# Patient Record
Sex: Female | Born: 1945 | Race: White | Hispanic: No | Smoking: Former smoker
Health system: Southern US, Community
[De-identification: ages and names within clinical notes are randomized; demographics above are authoritative.]

## PROBLEM LIST (undated history)

## (undated) DIAGNOSIS — J449 Chronic obstructive pulmonary disease, unspecified: Secondary | ICD-10-CM

## (undated) DIAGNOSIS — L409 Psoriasis, unspecified: Secondary | ICD-10-CM

## (undated) DIAGNOSIS — K2270 Barrett's esophagus without dysplasia: Secondary | ICD-10-CM

## (undated) DIAGNOSIS — I499 Cardiac arrhythmia, unspecified: Secondary | ICD-10-CM

## (undated) DIAGNOSIS — I5032 Chronic diastolic (congestive) heart failure: Secondary | ICD-10-CM

## (undated) DIAGNOSIS — I2510 Atherosclerotic heart disease of native coronary artery without angina pectoris: Secondary | ICD-10-CM

## (undated) DIAGNOSIS — Z951 Presence of aortocoronary bypass graft: Secondary | ICD-10-CM

## (undated) DIAGNOSIS — D649 Anemia, unspecified: Secondary | ICD-10-CM

## (undated) DIAGNOSIS — J8410 Pulmonary fibrosis, unspecified: Secondary | ICD-10-CM

## (undated) DIAGNOSIS — N1831 Chronic kidney disease, stage 3a: Secondary | ICD-10-CM

## (undated) DIAGNOSIS — Z9981 Dependence on supplemental oxygen: Secondary | ICD-10-CM

## (undated) DIAGNOSIS — J13 Pneumonia due to Streptococcus pneumoniae: Secondary | ICD-10-CM

## (undated) DIAGNOSIS — I214 Non-ST elevation (NSTEMI) myocardial infarction: Secondary | ICD-10-CM

## (undated) DIAGNOSIS — L4050 Arthropathic psoriasis, unspecified: Secondary | ICD-10-CM

## (undated) DIAGNOSIS — J45991 Cough variant asthma: Secondary | ICD-10-CM

## (undated) DIAGNOSIS — E039 Hypothyroidism, unspecified: Secondary | ICD-10-CM

## (undated) DIAGNOSIS — I639 Cerebral infarction, unspecified: Secondary | ICD-10-CM

## (undated) DIAGNOSIS — F32A Depression, unspecified: Secondary | ICD-10-CM

## (undated) DIAGNOSIS — J9611 Chronic respiratory failure with hypoxia: Secondary | ICD-10-CM

## (undated) DIAGNOSIS — R0609 Other forms of dyspnea: Secondary | ICD-10-CM

## (undated) DIAGNOSIS — K219 Gastro-esophageal reflux disease without esophagitis: Secondary | ICD-10-CM

## (undated) DIAGNOSIS — J9612 Chronic respiratory failure with hypercapnia: Secondary | ICD-10-CM

## (undated) DIAGNOSIS — M790 Rheumatism, unspecified: Secondary | ICD-10-CM

## (undated) DIAGNOSIS — J9610 Chronic respiratory failure, unspecified whether with hypoxia or hypercapnia: Secondary | ICD-10-CM

## (undated) DIAGNOSIS — E785 Hyperlipidemia, unspecified: Secondary | ICD-10-CM

## (undated) DIAGNOSIS — I779 Disorder of arteries and arterioles, unspecified: Secondary | ICD-10-CM

## (undated) DIAGNOSIS — I10 Essential (primary) hypertension: Secondary | ICD-10-CM

## (undated) DIAGNOSIS — Z8709 Personal history of other diseases of the respiratory system: Secondary | ICD-10-CM

## (undated) DIAGNOSIS — I5033 Acute on chronic diastolic (congestive) heart failure: Secondary | ICD-10-CM

## (undated) DIAGNOSIS — Z9989 Dependence on other enabling machines and devices: Secondary | ICD-10-CM

## (undated) DIAGNOSIS — J209 Acute bronchitis, unspecified: Secondary | ICD-10-CM

## (undated) DIAGNOSIS — R001 Bradycardia, unspecified: Secondary | ICD-10-CM

## (undated) DIAGNOSIS — R0602 Shortness of breath: Secondary | ICD-10-CM

## (undated) DIAGNOSIS — I509 Heart failure, unspecified: Secondary | ICD-10-CM

## (undated) DIAGNOSIS — M1990 Unspecified osteoarthritis, unspecified site: Secondary | ICD-10-CM

## (undated) HISTORY — PX: CARPAL TUNNEL RELEASE: SHX101

## (undated) HISTORY — DX: Presence of aortocoronary bypass graft: Z95.1

## (undated) HISTORY — DX: Bradycardia, unspecified: R00.1

## (undated) HISTORY — DX: Shortness of breath: R06.02

## (undated) HISTORY — DX: Pulmonary fibrosis, unspecified: J84.10

## (undated) HISTORY — PX: KNEE ARTHROSCOPY: SUR90

## (undated) HISTORY — PX: OTHER SURGICAL HISTORY: SHX169

## (undated) HISTORY — DX: Chronic respiratory failure with hypoxia: J96.11

## (undated) HISTORY — DX: Acute on chronic diastolic (congestive) heart failure: I50.33

## (undated) HISTORY — DX: Hypothyroidism, unspecified: E03.9

## (undated) HISTORY — PX: EYE SURGERY: SHX253

## (undated) HISTORY — PX: JOINT REPLACEMENT: SHX530

## (undated) HISTORY — DX: Chronic obstructive pulmonary disease, unspecified: J44.9

## (undated) HISTORY — DX: Arthropathic psoriasis, unspecified: L40.50

## (undated) HISTORY — DX: Barrett's esophagus without dysplasia: K22.70

## (undated) HISTORY — DX: Other forms of dyspnea: R06.09

## (undated) HISTORY — DX: Chronic respiratory failure, unspecified whether with hypoxia or hypercapnia: J96.10

## (undated) HISTORY — PX: TOTAL KNEE ARTHROPLASTY: SHX125

## (undated) HISTORY — DX: Morbid (severe) obesity due to excess calories: E66.01

## (undated) HISTORY — DX: Rheumatism, unspecified: M79.0

## (undated) HISTORY — DX: Acute bronchitis, unspecified: J20.9

## (undated) HISTORY — DX: Essential (primary) hypertension: I10

## (undated) HISTORY — DX: Cough variant asthma: J45.991

## (undated) HISTORY — DX: Hyperlipidemia, unspecified: E78.5

## (undated) HISTORY — PX: ABDOMINOPLASTY: SUR9

## (undated) HISTORY — PX: CHOLECYSTECTOMY: SHX55

## (undated) HISTORY — DX: Pneumonia due to Streptococcus pneumoniae: J13

## (undated) HISTORY — DX: Chronic respiratory failure with hypercapnia: J96.12

---

## 2000-11-18 ENCOUNTER — Encounter: Payer: Self-pay | Admitting: Internal Medicine

## 2001-01-01 ENCOUNTER — Encounter: Payer: Self-pay | Admitting: General Surgery

## 2001-01-04 ENCOUNTER — Encounter (INDEPENDENT_AMBULATORY_CARE_PROVIDER_SITE_OTHER): Payer: Self-pay

## 2001-01-04 ENCOUNTER — Observation Stay (HOSPITAL_COMMUNITY): Admission: RE | Admit: 2001-01-04 | Discharge: 2001-01-05 | Payer: Self-pay | Admitting: General Surgery

## 2001-02-18 ENCOUNTER — Encounter: Payer: Self-pay | Admitting: Internal Medicine

## 2001-02-18 ENCOUNTER — Encounter: Admission: RE | Admit: 2001-02-18 | Discharge: 2001-02-18 | Payer: Self-pay | Admitting: Internal Medicine

## 2001-03-05 ENCOUNTER — Encounter: Admission: RE | Admit: 2001-03-05 | Discharge: 2001-03-05 | Payer: Self-pay | Admitting: Neurosurgery

## 2001-03-05 ENCOUNTER — Encounter: Payer: Self-pay | Admitting: Neurosurgery

## 2001-11-16 ENCOUNTER — Encounter: Payer: Self-pay | Admitting: Internal Medicine

## 2002-04-26 ENCOUNTER — Encounter: Payer: Self-pay | Admitting: Internal Medicine

## 2003-03-20 ENCOUNTER — Other Ambulatory Visit: Admission: RE | Admit: 2003-03-20 | Discharge: 2003-03-20 | Payer: Self-pay | Admitting: Internal Medicine

## 2003-03-21 ENCOUNTER — Encounter: Payer: Self-pay | Admitting: Internal Medicine

## 2004-11-03 DIAGNOSIS — J13 Pneumonia due to Streptococcus pneumoniae: Secondary | ICD-10-CM

## 2004-11-03 DIAGNOSIS — Z8709 Personal history of other diseases of the respiratory system: Secondary | ICD-10-CM

## 2004-11-03 HISTORY — DX: Personal history of other diseases of the respiratory system: Z87.09

## 2004-11-03 HISTORY — DX: Pneumonia due to Streptococcus pneumoniae: J13

## 2004-12-03 ENCOUNTER — Ambulatory Visit: Payer: Self-pay | Admitting: Internal Medicine

## 2005-01-28 ENCOUNTER — Ambulatory Visit (HOSPITAL_COMMUNITY): Admission: RE | Admit: 2005-01-28 | Discharge: 2005-01-28 | Payer: Self-pay | Admitting: *Deleted

## 2005-01-28 ENCOUNTER — Encounter (INDEPENDENT_AMBULATORY_CARE_PROVIDER_SITE_OTHER): Payer: Self-pay | Admitting: *Deleted

## 2005-03-04 ENCOUNTER — Ambulatory Visit: Payer: Self-pay | Admitting: Internal Medicine

## 2005-03-11 ENCOUNTER — Other Ambulatory Visit: Admission: RE | Admit: 2005-03-11 | Discharge: 2005-03-11 | Payer: Self-pay | Admitting: Internal Medicine

## 2005-03-11 ENCOUNTER — Ambulatory Visit: Payer: Self-pay | Admitting: Internal Medicine

## 2005-03-23 ENCOUNTER — Ambulatory Visit: Payer: Self-pay | Admitting: Internal Medicine

## 2005-03-24 ENCOUNTER — Encounter: Payer: Self-pay | Admitting: Cardiology

## 2005-03-24 ENCOUNTER — Ambulatory Visit: Payer: Self-pay | Admitting: Physical Medicine & Rehabilitation

## 2005-03-24 ENCOUNTER — Inpatient Hospital Stay (HOSPITAL_COMMUNITY): Admission: EM | Admit: 2005-03-24 | Discharge: 2005-04-22 | Payer: Self-pay | Admitting: Emergency Medicine

## 2005-03-24 ENCOUNTER — Ambulatory Visit: Payer: Self-pay | Admitting: Cardiology

## 2005-03-24 ENCOUNTER — Ambulatory Visit: Payer: Self-pay | Admitting: Pulmonary Disease

## 2005-04-22 ENCOUNTER — Ambulatory Visit: Payer: Self-pay | Admitting: Physical Medicine & Rehabilitation

## 2005-04-22 ENCOUNTER — Inpatient Hospital Stay (HOSPITAL_COMMUNITY)
Admission: RE | Admit: 2005-04-22 | Discharge: 2005-05-02 | Payer: Self-pay | Admitting: Physical Medicine & Rehabilitation

## 2005-05-02 ENCOUNTER — Encounter: Payer: Self-pay | Admitting: Internal Medicine

## 2005-05-09 ENCOUNTER — Ambulatory Visit: Payer: Self-pay | Admitting: Internal Medicine

## 2005-06-16 ENCOUNTER — Ambulatory Visit: Payer: Self-pay | Admitting: Internal Medicine

## 2005-11-25 ENCOUNTER — Encounter: Payer: Self-pay | Admitting: Internal Medicine

## 2006-03-17 ENCOUNTER — Ambulatory Visit: Payer: Self-pay | Admitting: Internal Medicine

## 2006-03-31 ENCOUNTER — Ambulatory Visit: Payer: Self-pay | Admitting: Internal Medicine

## 2006-03-31 ENCOUNTER — Other Ambulatory Visit: Admission: RE | Admit: 2006-03-31 | Discharge: 2006-03-31 | Payer: Self-pay | Admitting: Internal Medicine

## 2006-03-31 ENCOUNTER — Encounter: Payer: Self-pay | Admitting: Internal Medicine

## 2006-09-12 ENCOUNTER — Encounter: Admission: RE | Admit: 2006-09-12 | Discharge: 2006-09-12 | Payer: Self-pay | Admitting: Internal Medicine

## 2006-11-06 ENCOUNTER — Ambulatory Visit: Payer: Self-pay | Admitting: Internal Medicine

## 2006-12-09 ENCOUNTER — Encounter: Payer: Self-pay | Admitting: Internal Medicine

## 2007-03-09 ENCOUNTER — Encounter: Payer: Self-pay | Admitting: Internal Medicine

## 2007-04-20 ENCOUNTER — Encounter: Payer: Self-pay | Admitting: Internal Medicine

## 2007-04-20 DIAGNOSIS — I10 Essential (primary) hypertension: Secondary | ICD-10-CM | POA: Insufficient documentation

## 2007-04-20 DIAGNOSIS — E039 Hypothyroidism, unspecified: Secondary | ICD-10-CM | POA: Insufficient documentation

## 2007-04-20 DIAGNOSIS — L4050 Arthropathic psoriasis, unspecified: Secondary | ICD-10-CM | POA: Insufficient documentation

## 2007-04-20 HISTORY — DX: Essential (primary) hypertension: I10

## 2007-05-11 ENCOUNTER — Ambulatory Visit: Payer: Self-pay | Admitting: Internal Medicine

## 2007-05-11 LAB — CONVERTED CEMR LAB
ALT: 19 units/L (ref 0–35)
AST: 24 units/L (ref 0–37)
Albumin: 3.6 g/dL (ref 3.5–5.2)
Alkaline Phosphatase: 51 units/L (ref 39–117)
BUN: 25 mg/dL — ABNORMAL HIGH (ref 6–23)
Basophils Absolute: 0 10*3/uL (ref 0.0–0.1)
Basophils Relative: 0.6 % (ref 0.0–1.0)
Bilirubin, Direct: 0.1 mg/dL (ref 0.0–0.3)
CO2: 34 meq/L — ABNORMAL HIGH (ref 19–32)
Calcium: 9.7 mg/dL (ref 8.4–10.5)
Chloride: 106 meq/L (ref 96–112)
Cholesterol: 213 mg/dL (ref 0–200)
Creatinine, Ser: 0.9 mg/dL (ref 0.4–1.2)
Direct LDL: 141.9 mg/dL
Eosinophils Absolute: 0.2 10*3/uL (ref 0.0–0.6)
Eosinophils Relative: 3.8 % (ref 0.0–5.0)
GFR calc Af Amer: 82 mL/min
GFR calc non Af Amer: 68 mL/min
Glucose, Bld: 102 mg/dL — ABNORMAL HIGH (ref 70–99)
HCT: 37.9 % (ref 36.0–46.0)
HDL: 57 mg/dL (ref >39.0)
Hemoglobin: 12.9 g/dL (ref 12.0–15.0)
Lymphocytes Relative: 34.9 % (ref 12.0–46.0)
MCHC: 33.9 g/dL (ref 30.0–36.0)
MCV: 86.5 fL (ref 78.0–100.0)
Monocytes Absolute: 0.7 10*3/uL (ref 0.2–0.7)
Monocytes Relative: 11.4 % — ABNORMAL HIGH (ref 3.0–11.0)
Neutro Abs: 3.2 10*3/uL (ref 1.4–7.7)
Neutrophils Relative %: 49.3 % (ref 43.0–77.0)
Platelets: 231 10*3/uL (ref 150–400)
Potassium: 3.8 meq/L (ref 3.5–5.1)
RBC: 4.38 M/uL (ref 3.87–5.11)
RDW: 13.7 % (ref 11.5–14.6)
Sodium: 144 meq/L (ref 135–145)
TSH: 7.55 microintl units/mL — ABNORMAL HIGH (ref 0.35–5.50)
Total Bilirubin: 0.8 mg/dL (ref 0.3–1.2)
Total CHOL/HDL Ratio: 3.7
Total Protein: 6.9 g/dL (ref 6.0–8.3)
Triglycerides: 87 mg/dL (ref 0–149)
VLDL: 17 mg/dL (ref 0–40)
WBC: 6.3 10*3/uL (ref 4.5–10.5)

## 2007-05-18 ENCOUNTER — Ambulatory Visit: Payer: Self-pay | Admitting: Internal Medicine

## 2007-05-21 ENCOUNTER — Encounter: Payer: Self-pay | Admitting: Internal Medicine

## 2007-06-01 ENCOUNTER — Telehealth: Payer: Self-pay | Admitting: Internal Medicine

## 2007-06-03 ENCOUNTER — Inpatient Hospital Stay (HOSPITAL_COMMUNITY): Admission: RE | Admit: 2007-06-03 | Discharge: 2007-06-06 | Payer: Self-pay | Admitting: Orthopedic Surgery

## 2007-06-05 ENCOUNTER — Ambulatory Visit: Payer: Self-pay | Admitting: Internal Medicine

## 2007-07-02 ENCOUNTER — Encounter: Payer: Self-pay | Admitting: Internal Medicine

## 2007-08-09 ENCOUNTER — Telehealth: Payer: Self-pay | Admitting: Internal Medicine

## 2007-08-30 ENCOUNTER — Encounter: Payer: Self-pay | Admitting: Internal Medicine

## 2007-09-07 ENCOUNTER — Ambulatory Visit: Payer: Self-pay | Admitting: Internal Medicine

## 2007-09-21 ENCOUNTER — Ambulatory Visit: Payer: Self-pay | Admitting: Internal Medicine

## 2007-10-19 ENCOUNTER — Ambulatory Visit: Payer: Self-pay | Admitting: Internal Medicine

## 2007-12-21 ENCOUNTER — Encounter: Payer: Self-pay | Admitting: Internal Medicine

## 2007-12-28 ENCOUNTER — Encounter: Payer: Self-pay | Admitting: Internal Medicine

## 2008-01-04 ENCOUNTER — Telehealth: Payer: Self-pay | Admitting: Internal Medicine

## 2008-01-25 ENCOUNTER — Telehealth: Payer: Self-pay | Admitting: Internal Medicine

## 2008-01-26 ENCOUNTER — Telehealth: Payer: Self-pay | Admitting: Internal Medicine

## 2008-02-15 ENCOUNTER — Ambulatory Visit: Payer: Self-pay | Admitting: Internal Medicine

## 2008-06-20 ENCOUNTER — Ambulatory Visit: Payer: Self-pay | Admitting: Internal Medicine

## 2008-06-20 LAB — CONVERTED CEMR LAB
ALT: 31 units/L (ref 0–35)
AST: 33 units/L (ref 0–37)
Albumin: 4 g/dL (ref 3.5–5.2)
Alkaline Phosphatase: 55 units/L (ref 39–117)
BUN: 24 mg/dL — ABNORMAL HIGH (ref 6–23)
Basophils Absolute: 0.1 10*3/uL (ref 0.0–0.1)
Basophils Relative: 0.9 % (ref 0.0–3.0)
Bilirubin Urine: NEGATIVE
Bilirubin, Direct: 0.1 mg/dL (ref 0.0–0.3)
Blood in Urine, dipstick: NEGATIVE
CO2: 31 meq/L (ref 19–32)
Calcium: 9.5 mg/dL (ref 8.4–10.5)
Chloride: 104 meq/L (ref 96–112)
Cholesterol: 188 mg/dL (ref 0–200)
Creatinine, Ser: 1 mg/dL (ref 0.4–1.2)
Eosinophils Absolute: 0.4 10*3/uL (ref 0.0–0.7)
Eosinophils Relative: 6.6 % — ABNORMAL HIGH (ref 0.0–5.0)
GFR calc Af Amer: 72 mL/min
GFR calc non Af Amer: 60 mL/min
Glucose, Bld: 94 mg/dL (ref 70–99)
Glucose, Urine, Semiquant: NEGATIVE
HCT: 36.2 % (ref 36.0–46.0)
HDL: 41.7 mg/dL (ref >39.0)
Hemoglobin: 12.2 g/dL (ref 12.0–15.0)
Ketones, urine, test strip: NEGATIVE
LDL Cholesterol: 128 mg/dL — ABNORMAL HIGH (ref 0–99)
Lymphocytes Relative: 36.2 % (ref 12.0–46.0)
MCHC: 33.7 g/dL (ref 30.0–36.0)
MCV: 90.8 fL (ref 78.0–100.0)
Monocytes Absolute: 0.6 10*3/uL (ref 0.1–1.0)
Monocytes Relative: 11.3 % (ref 3.0–12.0)
Neutro Abs: 2.5 10*3/uL (ref 1.4–7.7)
Neutrophils Relative %: 45 % (ref 43.0–77.0)
Nitrite: NEGATIVE
Platelets: 199 10*3/uL (ref 150–400)
Potassium: 4.5 meq/L (ref 3.5–5.1)
RBC: 3.99 M/uL (ref 3.87–5.11)
RDW: 14.1 % (ref 11.5–14.6)
Sodium: 143 meq/L (ref 135–145)
Specific Gravity, Urine: 1.02
TSH: 4.64 microintl units/mL (ref 0.35–5.50)
Total Bilirubin: 0.9 mg/dL (ref 0.3–1.2)
Total CHOL/HDL Ratio: 4.5
Total Protein: 7 g/dL (ref 6.0–8.3)
Triglycerides: 93 mg/dL (ref 0–149)
Urobilinogen, UA: 0.2
VLDL: 19 mg/dL (ref 0–40)
WBC Urine, dipstick: NEGATIVE
WBC: 5.6 10*3/uL (ref 4.5–10.5)
pH: 6

## 2008-06-27 ENCOUNTER — Encounter: Payer: Self-pay | Admitting: Internal Medicine

## 2008-07-11 ENCOUNTER — Other Ambulatory Visit: Admission: RE | Admit: 2008-07-11 | Discharge: 2008-07-11 | Payer: Self-pay | Admitting: Internal Medicine

## 2008-07-11 ENCOUNTER — Encounter: Payer: Self-pay | Admitting: Internal Medicine

## 2008-07-11 ENCOUNTER — Ambulatory Visit: Payer: Self-pay | Admitting: Internal Medicine

## 2008-12-26 ENCOUNTER — Encounter: Payer: Self-pay | Admitting: Internal Medicine

## 2009-06-22 ENCOUNTER — Encounter: Payer: Self-pay | Admitting: Internal Medicine

## 2009-06-25 ENCOUNTER — Telehealth: Payer: Self-pay | Admitting: Internal Medicine

## 2009-06-27 ENCOUNTER — Telehealth: Payer: Self-pay | Admitting: Internal Medicine

## 2009-06-27 ENCOUNTER — Encounter: Payer: Self-pay | Admitting: Internal Medicine

## 2009-07-03 ENCOUNTER — Ambulatory Visit: Payer: Self-pay | Admitting: Internal Medicine

## 2009-10-12 ENCOUNTER — Telehealth: Payer: Self-pay | Admitting: Internal Medicine

## 2010-01-15 ENCOUNTER — Encounter: Payer: Self-pay | Admitting: Internal Medicine

## 2010-02-04 ENCOUNTER — Telehealth: Payer: Self-pay

## 2010-02-04 ENCOUNTER — Ambulatory Visit: Payer: Self-pay | Admitting: Family Medicine

## 2010-02-04 DIAGNOSIS — R05 Cough: Secondary | ICD-10-CM | POA: Insufficient documentation

## 2010-02-04 DIAGNOSIS — R059 Cough, unspecified: Secondary | ICD-10-CM | POA: Insufficient documentation

## 2010-02-04 DIAGNOSIS — J9611 Chronic respiratory failure with hypoxia: Secondary | ICD-10-CM

## 2010-02-04 HISTORY — DX: Chronic respiratory failure with hypoxia: J96.11

## 2010-02-12 ENCOUNTER — Ambulatory Visit: Payer: Self-pay | Admitting: Internal Medicine

## 2010-02-12 LAB — CONVERTED CEMR LAB
Basophils Absolute: 0 10*3/uL (ref 0.0–0.1)
Basophils Relative: 0.6 % (ref 0.0–3.0)
Eosinophils Absolute: 0.3 10*3/uL (ref 0.0–0.7)
Eosinophils Relative: 4.3 % (ref 0.0–5.0)
HCT: 32.1 % — ABNORMAL LOW (ref 36.0–46.0)
Hemoglobin: 10.6 g/dL — ABNORMAL LOW (ref 12.0–15.0)
Lymphocytes Relative: 29.6 % (ref 12.0–46.0)
Lymphs Abs: 2.1 10*3/uL (ref 0.7–4.0)
MCHC: 33.1 g/dL (ref 30.0–36.0)
MCV: 84.2 fL (ref 78.0–100.0)
Monocytes Absolute: 0.7 10*3/uL (ref 0.1–1.0)
Monocytes Relative: 9.8 % (ref 3.0–12.0)
Neutro Abs: 4 10*3/uL (ref 1.4–7.7)
Neutrophils Relative %: 55.7 % (ref 43.0–77.0)
Platelets: 345 10*3/uL (ref 150.0–400.0)
RBC: 3.82 M/uL — ABNORMAL LOW (ref 3.87–5.11)
RDW: 16.5 % — ABNORMAL HIGH (ref 11.5–14.6)
WBC: 7.2 10*3/uL (ref 4.5–10.5)

## 2010-02-13 ENCOUNTER — Telehealth: Payer: Self-pay

## 2010-02-13 ENCOUNTER — Ambulatory Visit: Payer: Self-pay | Admitting: Internal Medicine

## 2010-02-13 ENCOUNTER — Telehealth: Payer: Self-pay | Admitting: Internal Medicine

## 2010-02-19 ENCOUNTER — Ambulatory Visit: Payer: Self-pay | Admitting: Internal Medicine

## 2010-02-19 DIAGNOSIS — J189 Pneumonia, unspecified organism: Secondary | ICD-10-CM | POA: Insufficient documentation

## 2010-03-19 ENCOUNTER — Ambulatory Visit: Payer: Self-pay | Admitting: Internal Medicine

## 2010-03-20 ENCOUNTER — Telehealth: Payer: Self-pay | Admitting: Internal Medicine

## 2010-04-09 ENCOUNTER — Ambulatory Visit: Payer: Self-pay | Admitting: Internal Medicine

## 2010-04-12 ENCOUNTER — Encounter: Payer: Self-pay | Admitting: Internal Medicine

## 2010-04-19 ENCOUNTER — Telehealth: Payer: Self-pay | Admitting: Internal Medicine

## 2010-07-02 ENCOUNTER — Ambulatory Visit: Payer: Self-pay | Admitting: Pulmonary Disease

## 2010-07-02 ENCOUNTER — Inpatient Hospital Stay (HOSPITAL_COMMUNITY): Admission: EM | Admit: 2010-07-02 | Discharge: 2010-07-04 | Payer: Self-pay | Admitting: Internal Medicine

## 2010-07-02 ENCOUNTER — Ambulatory Visit: Payer: Self-pay | Admitting: Internal Medicine

## 2010-07-02 ENCOUNTER — Ambulatory Visit: Payer: Self-pay | Admitting: Cardiology

## 2010-07-02 DIAGNOSIS — S22000A Wedge compression fracture of unspecified thoracic vertebra, initial encounter for closed fracture: Secondary | ICD-10-CM | POA: Insufficient documentation

## 2010-07-03 ENCOUNTER — Encounter: Payer: Self-pay | Admitting: Internal Medicine

## 2010-07-04 ENCOUNTER — Telehealth: Payer: Self-pay | Admitting: Internal Medicine

## 2010-07-05 ENCOUNTER — Telehealth: Payer: Self-pay | Admitting: Internal Medicine

## 2010-07-05 ENCOUNTER — Encounter: Payer: Self-pay | Admitting: Internal Medicine

## 2010-07-09 ENCOUNTER — Encounter: Payer: Self-pay | Admitting: Internal Medicine

## 2010-07-11 ENCOUNTER — Encounter: Payer: Self-pay | Admitting: Internal Medicine

## 2010-07-18 ENCOUNTER — Ambulatory Visit: Payer: Self-pay | Admitting: Internal Medicine

## 2010-07-18 DIAGNOSIS — J8410 Pulmonary fibrosis, unspecified: Secondary | ICD-10-CM

## 2010-07-18 DIAGNOSIS — J449 Chronic obstructive pulmonary disease, unspecified: Secondary | ICD-10-CM

## 2010-07-18 HISTORY — DX: Chronic obstructive pulmonary disease, unspecified: J44.9

## 2010-07-18 HISTORY — DX: Pulmonary fibrosis, unspecified: J84.10

## 2010-07-18 LAB — CONVERTED CEMR LAB
ALT: 17 units/L (ref 0–35)
AST: 20 units/L (ref 0–37)
Albumin: 4.1 g/dL (ref 3.5–5.2)
Alkaline Phosphatase: 48 units/L (ref 39–117)
BUN: 28 mg/dL — ABNORMAL HIGH (ref 6–23)
Basophils Absolute: 0.1 10*3/uL (ref 0.0–0.1)
Basophils Relative: 0.5 % (ref 0.0–3.0)
Bilirubin Urine: NEGATIVE
Bilirubin, Direct: 0.1 mg/dL (ref 0.0–0.3)
Blood in Urine, dipstick: NEGATIVE
CO2: 34 meq/L — ABNORMAL HIGH (ref 19–32)
Calcium: 9.3 mg/dL (ref 8.4–10.5)
Chloride: 101 meq/L (ref 96–112)
Cholesterol: 151 mg/dL (ref 0–200)
Creatinine, Ser: 0.9 mg/dL (ref 0.4–1.2)
Eosinophils Absolute: 0.1 10*3/uL (ref 0.0–0.7)
Eosinophils Relative: 0.5 % (ref 0.0–5.0)
GFR calc non Af Amer: 66.28 mL/min (ref >60)
Glucose, Bld: 78 mg/dL (ref 70–99)
Glucose, Urine, Semiquant: NEGATIVE
HCT: 33.3 % — ABNORMAL LOW (ref 36.0–46.0)
HDL: 43.2 mg/dL (ref >39.00)
Hemoglobin: 10.2 g/dL — ABNORMAL LOW (ref 12.0–15.0)
Ketones, urine, test strip: NEGATIVE
LDL Cholesterol: 91 mg/dL (ref 0–99)
Lymphocytes Relative: 20.9 % (ref 12.0–46.0)
Lymphs Abs: 2.3 10*3/uL (ref 0.7–4.0)
MCHC: 30.7 g/dL (ref 30.0–36.0)
MCV: 78.2 fL (ref 78.0–100.0)
Monocytes Absolute: 1 10*3/uL (ref 0.1–1.0)
Monocytes Relative: 9.3 % (ref 3.0–12.0)
Neutro Abs: 7.7 10*3/uL (ref 1.4–7.7)
Neutrophils Relative %: 68.8 % (ref 43.0–77.0)
Nitrite: NEGATIVE
Platelets: 207 10*3/uL (ref 150.0–400.0)
Potassium: 4.3 meq/L (ref 3.5–5.1)
Protein, U semiquant: NEGATIVE
RBC: 4.25 M/uL (ref 3.87–5.11)
RDW: 18.1 % — ABNORMAL HIGH (ref 11.5–14.6)
Sodium: 142 meq/L (ref 135–145)
Specific Gravity, Urine: 1.02
TSH: 4.46 microintl units/mL (ref 0.35–5.50)
Total Bilirubin: 0.4 mg/dL (ref 0.3–1.2)
Total CHOL/HDL Ratio: 3
Total Protein: 6.9 g/dL (ref 6.0–8.3)
Triglycerides: 82 mg/dL (ref 0.0–149.0)
Urobilinogen, UA: 0.2
VLDL: 16.4 mg/dL (ref 0.0–40.0)
WBC Urine, dipstick: NEGATIVE
WBC: 11.2 10*3/uL — ABNORMAL HIGH (ref 4.5–10.5)
pH: 7

## 2010-07-23 ENCOUNTER — Ambulatory Visit: Payer: Self-pay | Admitting: Internal Medicine

## 2010-07-23 ENCOUNTER — Other Ambulatory Visit
Admission: RE | Admit: 2010-07-23 | Discharge: 2010-07-23 | Payer: Self-pay | Source: Home / Self Care | Admitting: Internal Medicine

## 2010-07-25 LAB — CONVERTED CEMR LAB: Pap Smear: NEGATIVE

## 2010-08-20 ENCOUNTER — Telehealth (INDEPENDENT_AMBULATORY_CARE_PROVIDER_SITE_OTHER): Payer: Self-pay | Admitting: *Deleted

## 2010-08-20 ENCOUNTER — Ambulatory Visit: Payer: Self-pay | Admitting: Internal Medicine

## 2010-09-30 ENCOUNTER — Telehealth (INDEPENDENT_AMBULATORY_CARE_PROVIDER_SITE_OTHER): Payer: Self-pay | Admitting: *Deleted

## 2010-10-02 ENCOUNTER — Telehealth: Payer: Self-pay | Admitting: Internal Medicine

## 2010-10-08 ENCOUNTER — Telehealth (INDEPENDENT_AMBULATORY_CARE_PROVIDER_SITE_OTHER): Payer: Self-pay | Admitting: *Deleted

## 2010-11-24 ENCOUNTER — Encounter: Payer: Self-pay | Admitting: Internal Medicine

## 2010-11-25 ENCOUNTER — Encounter: Payer: Self-pay | Admitting: Internal Medicine

## 2010-11-26 ENCOUNTER — Ambulatory Visit: Admit: 2010-11-26 | Payer: Self-pay | Admitting: Internal Medicine

## 2010-11-29 ENCOUNTER — Encounter: Payer: Self-pay | Admitting: Internal Medicine

## 2010-12-01 LAB — CONVERTED CEMR LAB
BUN: 22 mg/dL (ref 6–23)
Creatinine, Ser: 0.9 mg/dL (ref 0.4–1.2)

## 2010-12-03 ENCOUNTER — Encounter: Payer: Self-pay | Admitting: Internal Medicine

## 2010-12-03 ENCOUNTER — Emergency Department (HOSPITAL_COMMUNITY)
Admission: EM | Admit: 2010-12-03 | Discharge: 2010-12-03 | Disposition: A | Discharge status: Home / Self Care | Payer: Managed Care, Other (non HMO) | Attending: Emergency Medicine | Admitting: Emergency Medicine

## 2010-12-03 ENCOUNTER — Ambulatory Visit: Admit: 2010-12-03 | Payer: Self-pay | Admitting: Internal Medicine

## 2010-12-03 ENCOUNTER — Telehealth: Payer: Self-pay | Admitting: Internal Medicine

## 2010-12-03 DIAGNOSIS — I491 Atrial premature depolarization: Secondary | ICD-10-CM | POA: Insufficient documentation

## 2010-12-03 DIAGNOSIS — M7989 Other specified soft tissue disorders: Secondary | ICD-10-CM | POA: Insufficient documentation

## 2010-12-03 DIAGNOSIS — I10 Essential (primary) hypertension: Secondary | ICD-10-CM | POA: Insufficient documentation

## 2010-12-03 DIAGNOSIS — R062 Wheezing: Secondary | ICD-10-CM | POA: Insufficient documentation

## 2010-12-03 DIAGNOSIS — R635 Abnormal weight gain: Secondary | ICD-10-CM | POA: Insufficient documentation

## 2010-12-03 DIAGNOSIS — I509 Heart failure, unspecified: Secondary | ICD-10-CM | POA: Insufficient documentation

## 2010-12-03 DIAGNOSIS — R609 Edema, unspecified: Secondary | ICD-10-CM | POA: Insufficient documentation

## 2010-12-03 DIAGNOSIS — E039 Hypothyroidism, unspecified: Secondary | ICD-10-CM | POA: Insufficient documentation

## 2010-12-03 DIAGNOSIS — R0602 Shortness of breath: Secondary | ICD-10-CM | POA: Insufficient documentation

## 2010-12-03 DIAGNOSIS — J90 Pleural effusion, not elsewhere classified: Secondary | ICD-10-CM | POA: Insufficient documentation

## 2010-12-03 DIAGNOSIS — R0601 Orthopnea: Secondary | ICD-10-CM | POA: Insufficient documentation

## 2010-12-03 LAB — DIFFERENTIAL
Basophils Absolute: 0 10*3/uL (ref 0.0–0.1)
Basophils Relative: 0 % (ref 0–1)
Eosinophils Absolute: 0 10*3/uL (ref 0.0–0.7)
Eosinophils Relative: 1 % (ref 0–5)
Lymphocytes Relative: 9 % — ABNORMAL LOW (ref 12–46)
Lymphs Abs: 0.7 10*3/uL (ref 0.7–4.0)
Monocytes Absolute: 0.9 10*3/uL (ref 0.1–1.0)
Monocytes Relative: 10 % (ref 3–12)
Neutro Abs: 7.1 10*3/uL (ref 1.7–7.7)
Neutrophils Relative %: 81 % — ABNORMAL HIGH (ref 43–77)

## 2010-12-03 LAB — URINALYSIS, ROUTINE W REFLEX MICROSCOPIC
Ketones, ur: NEGATIVE mg/dL
Leukocytes, UA: NEGATIVE
Nitrite: NEGATIVE
Protein, ur: >300 mg/dL — AB
Specific Gravity, Urine: 1.023 (ref 1.005–1.030)
Urine Glucose, Fasting: NEGATIVE mg/dL
Urobilinogen, UA: 0.2 mg/dL (ref 0.0–1.0)
pH: 6 (ref 5.0–8.0)

## 2010-12-03 LAB — URINE MICROSCOPIC-ADD ON

## 2010-12-03 LAB — BASIC METABOLIC PANEL
BUN: 13 mg/dL (ref 6–23)
CO2: 31 mEq/L (ref 19–32)
Calcium: 9 mg/dL (ref 8.4–10.5)
Chloride: 99 mEq/L (ref 96–112)
Creatinine, Ser: 0.85 mg/dL (ref 0.4–1.2)
GFR calc Af Amer: >60 mL/min (ref >60)
GFR calc non Af Amer: >60 mL/min (ref >60)
Glucose, Bld: 112 mg/dL — ABNORMAL HIGH (ref 70–99)
Potassium: 3.9 mEq/L (ref 3.5–5.1)
Sodium: 138 mEq/L (ref 135–145)

## 2010-12-03 LAB — CBC
HCT: 30.9 % — ABNORMAL LOW (ref 36.0–46.0)
Hemoglobin: 8.6 g/dL — ABNORMAL LOW (ref 12.0–15.0)
MCH: 23 pg — ABNORMAL LOW (ref 26.0–34.0)
MCHC: 27.8 g/dL — ABNORMAL LOW (ref 30.0–36.0)
MCV: 82.6 fL (ref 78.0–100.0)
Platelets: 150 10*3/uL (ref 150–400)
RBC: 3.74 MIL/uL — ABNORMAL LOW (ref 3.87–5.11)
RDW: 15.8 % — ABNORMAL HIGH (ref 11.5–15.5)
WBC: 8.7 10*3/uL (ref 4.0–10.5)

## 2010-12-03 LAB — TROPONIN I: Troponin I: 0.03 ng/mL (ref 0.00–0.06)

## 2010-12-03 LAB — BRAIN NATRIURETIC PEPTIDE: Pro B Natriuretic peptide (BNP): 222 pg/mL — ABNORMAL HIGH (ref 0.0–100.0)

## 2010-12-03 LAB — CK TOTAL AND CKMB (NOT AT ARMC)
CK, MB: 3.7 ng/mL (ref 0.3–4.0)
Relative Index: 3.2 — ABNORMAL HIGH (ref 0.0–2.5)
Total CK: 117 U/L (ref 7–177)

## 2010-12-04 ENCOUNTER — Telehealth: Payer: Self-pay | Admitting: Internal Medicine

## 2010-12-04 ENCOUNTER — Encounter: Payer: Self-pay | Admitting: Internal Medicine

## 2010-12-04 ENCOUNTER — Institutional Professional Consult (permissible substitution) (INDEPENDENT_AMBULATORY_CARE_PROVIDER_SITE_OTHER): Payer: Managed Care, Other (non HMO) | Admitting: Internal Medicine

## 2010-12-04 DIAGNOSIS — I5033 Acute on chronic diastolic (congestive) heart failure: Secondary | ICD-10-CM | POA: Insufficient documentation

## 2010-12-04 DIAGNOSIS — I10 Essential (primary) hypertension: Secondary | ICD-10-CM

## 2010-12-04 DIAGNOSIS — J449 Chronic obstructive pulmonary disease, unspecified: Secondary | ICD-10-CM

## 2010-12-04 HISTORY — DX: Acute on chronic diastolic (congestive) heart failure: I50.33

## 2010-12-05 ENCOUNTER — Encounter: Payer: Self-pay | Admitting: Internal Medicine

## 2010-12-05 ENCOUNTER — Ambulatory Visit (INDEPENDENT_AMBULATORY_CARE_PROVIDER_SITE_OTHER): Payer: Managed Care, Other (non HMO) | Admitting: Internal Medicine

## 2010-12-05 DIAGNOSIS — R0902 Hypoxemia: Secondary | ICD-10-CM

## 2010-12-05 DIAGNOSIS — J8410 Pulmonary fibrosis, unspecified: Secondary | ICD-10-CM

## 2010-12-05 NOTE — Progress Notes (Signed)
Summary: NEED BP RX ADVICE  Phone Note Call from Patient Call back at Home Phone [phone]   Caller: Patient Call For: KWIA Reason for Call: Talk to Nurse, Lab or Test Results Summary of Call: HAVING KNEE REPLACEMENT ON 06/03/07. SAW DR  K ON 05/18/07 AND HER BP WAS HIGH. SHE WAS GIVEN NEW RX AMLODIPINE. SHE HAD ON HCTZ. SHOULD SHE CONTINUE HCTZ AS WELL? NEED ADVICE. ANESTHESIOLOGIST SUGGESTED THIS MORNING TO TAKE BOTH. HER BP THIS AM - 181/102. LEFT ARM WAS- 178/93. HE SUGGESTED THAT SHE TAKE BOTH MEDS. UNTIL HER SURGERY ON THURSDAY TO HELP LOWER BP SOME. PLEASE CALL. OR LEAVE MESSAGE. OR CALL HER AT WORK TOMORROW AT [phone] AT 9AM. Initial call taken by: Warnell Forester,  June 01, 2007 10:14 AM  Follow-up for Phone Call        Continue to take amlodipine in addition to all prior BP meds (HCTZ) Follow-up by: Gordy Savers  MD,  June 01, 2007 10:31 AM  Additional Follow-up for Phone Call Additional follow up Details #1::        SPOKE WITH PT. SHE WILL CONTINUE TO TAKE  BOTH MEDS AS DIRECTED. Additional Follow-up by: Warnell Forester,  June 01, 2007 11:12 AM

## 2010-12-05 NOTE — Progress Notes (Signed)
Summary: DC citalopram  Phone Note Call from Patient   Caller: Patient Call For: Gordy Savers  MD Summary of Call: Pt is requesting to DC Citalopram, and wants to know if she should taper? [phone] #[phone] or #1 Initial call taken by: Lynann Beaver CMA,  June 25, 2009 9:38 AM  Follow-up for Phone Call        decrease dose to 20 mg nightly, x 1 week, then 20 mg Monday, Wednesday, Friday x 3 weeks, then stop Follow-up by: Roderick Pee MD,  June 25, 2009 2:26 PM  Additional Follow-up for Phone Call Additional follow up Details #1::        Phone Call Completed Additional Follow-up by: [name], RN,  June 25, 2009 2:37 PM

## 2010-12-05 NOTE — Progress Notes (Signed)
Summary: problem micardis hct, needs PA  Phone Note Call from Patient Call back at [phone]   Call For: [name] Summary of Call: Micardis HCT 80-25 needs preapproval or sub that they allow.  They sent you a letter last week.  Need new rx or the preapproval.  3mos rx & rf Initial call taken by: Rudy Jew, RN,  January 04, 2008 2:53 PM  Follow-up for Phone Call        Called pt re: Rx ready to pick-up.  She states she does not need another Rx for the same medicine, she has refills, however it does need Prior Authorization from Dr Amador Cunas. She has been informed they will not fill without PA.  If he will not do PA, then she will need an alternate medicine. Follow-up by: Sid Falcon LPN,  January 05, 2008 8:49 AM  Additional Follow-up for Phone Call Additional follow up Details #1::        How do we do prior authorization? Additional Follow-up by: Gordy Savers  MD,  January 05, 2008 10:24 AM    Additional Follow-up for Phone Call Additional follow up Details #2::    Called pt back re: additional information for PA.  She is going to call Rosann Auerbach and her [facility] pharmacy tonight and call back.  ..................................................................Marland KitchenSid Falcon LPN  January 05, 3663 1:31 PM     Prescriptions: MICARDIS HCT 80-25 MG  TABS (TELMISARTAN-HCTZ) one daily  #90 x 6   Entered and Authorized by:   Gordy Savers  MD   Signed by:   Gordy Savers  MD on 01/04/2008   Method used:   Print then Give to Patient   RxID:   [id number]

## 2010-12-05 NOTE — Assessment & Plan Note (Signed)
Summary: blood pressure/mhf   Vital Signs:  Patient Profile:   65 Years Old Female Weight:      177 pounds Temp:     99 degrees F oral Pulse rate:   82 / minute Pulse rhythm:   regular BP sitting:   172 / 80  Vitals Entered By: Lynann Beaver CMA (September 07, 2007 3:12 PM)                 Chief Complaint:  pt concerned about elevated BP.  History of Present Illness: 65 year old patient seen today for follow-up of her hypertension.  She is placed on amlodipine in addition to diuretic therapy approximately 4 months ago at the time of her annual exam.  She is followed closely Dr. Alanson Puls and noted to have a high blood pressure readings especially systolics.  Was also seen in the emergency room with accelerated high blood pressure at one point.        Physical Exam  General:     Well-developed,well-nourished,in no acute distress; alert,appropriate and cooperative throughout examination  approach 170- [phone]    Impression & Recommendations:  Problem # 1:  HYPERTENSION (ICD-401.9)  The following medications were removed from the medication list:    Triamterene-hctz 37.5-25 Mg Caps (Triamterene-hctz) ..... One qd  Her updated medication list for this problem includes:    Amlodipine Besylate 5 Mg Tabs (Amlodipine besylate) .Marland Kitchen... 1 once daily    Micardis Hct 40-12.5 Mg Tabs (Telmisartan-hctz) ..... One daily   Complete Medication List: 1)  Prilosec 20 Mg Cpdr (Omeprazole) .... Take 1 capsule once a day 2)  Ultram 50 Mg Tabs (Tramadol hcl) .... Take 1 tablet by mouth twice a day 3)  Synthroid 150 Mcg Tabs (Levothyroxine sodium) .... Not sure about dose 4)  Ec-naprosyn 500 Mg Tbec (Naproxen) .... One bid 5)  Sulfasalazine 500 Mg Tbec (Sulfasalazine) .... Two am one pm 6)  Amlodipine Besylate 5 Mg Tabs (Amlodipine besylate) .Marland Kitchen.. 1 once daily 7)  Enbrel 25 Mg/0.48ml Soln (Etanercept) .... Weekly 8)  Famvir 500 Mg Tabs (Famciclovir) .... Prn 9)  Tylenol 325 Mg Tabs  (Acetaminophen) .... 3 tid 10)  Micardis Hct 40-12.5 Mg Tabs (Telmisartan-hctz) .... One daily   Patient Instructions: 1)  Please schedule a follow-up appointment in 2 weeks. 2)  Limit your Sodium (Salt). 3)  It is important that you exercise regularly at least 20 minutes 5 times a week. If you develop chest pain, have severe difficulty breathing, or feel very tired , stop exercising immediately and seek medical attention. 4)  You need to lose weight. Consider a lower calorie diet and regular exercise.     ]

## 2010-12-05 NOTE — Progress Notes (Signed)
Summary: Needs PFT results  Phone Note Call from Patient   Caller: Patient Call For: Gordy Savers  MD Summary of Call: [phone] May leave results on voice mail. Is anxious to get PFT results. Initial call taken by: Lynann Beaver CMA,  April 19, 2010 9:25 AM

## 2010-12-05 NOTE — Letter (Signed)
Summary: [facility]   Imported By: Maryln Gottron 07/06/2009 10:15:05  _____________________________________________________________________  External Attachment:    Type:   Image     Comment:   External Document

## 2010-12-05 NOTE — Progress Notes (Signed)
Summary: lab & synthroid  Phone Note Call from Patient Call back at W [phone] H [phone]    Caller: live Call For: judy/kwia/rachel/todd Summary of Call: Lab done at rheumatology appt with Dr. Jimmy Footman being faxed to Dr. Denver Faster that shows her thyroid at 29 & nurse there said that Synthroid needs to be changed.  Patient requests that Darel Hong take lab to another doctor & get change ASAP because she goes on vacation 9-9 & she'd like to be getting straightened out before that.   [facility].    Initial call taken by: Rudy Jew, RN,  June 27, 2009 3:50 PM  Follow-up for Phone Call        appointment with Dr. Kirtland Bouchard. this coming Monday to discuss options Follow-up by: Roderick Pee MD,  June 28, 2009 8:10 AM  Additional Follow-up for Phone Call Additional follow up Details #1::        appt scheduled. Additional Follow-up by: [name], RN,  June 28, 2009 9:43 AM

## 2010-12-05 NOTE — Letter (Signed)
Summary: [facility]   Imported By: Maryln Gottron 04/18/2010 15:57:54  _____________________________________________________________________  External Attachment:    Type:   Image     Comment:   External Document

## 2010-12-05 NOTE — Assessment & Plan Note (Signed)
Summary: 1 wk rov/njr   Vital Signs:  Patient profile:   65 year old female Weight:      147 pounds Temp:     98.4 degrees F oral BP sitting:   110 / 58  (right arm) Cuff size:   regular  Vitals Entered By: Duard Brady LPN (February 19, 2010 11:05 AM) CC: 1 wk f/u pneumonia-doing better Is Patient Diabetic? No   CC:  1 wk f/u pneumonia-doing better.  History of Present Illness: 65 year old patient who has a history of psoriatic arthritis on Enbrel therapy.  She has recently been treated for a community acquired pneumonia with Avelox.  She was seen in follow-up with a persistent hypoxemia and a d-dimer was elevated.  A subsequent chest CT revealed bilateral infiltrates and no evidence of a PE.  Clinical exam at that time revealed clear lung fields and an O2 saturation of 88 to 90.  Today she returns for follow-up.  She is somewhat improved, but still having some exertional weakness.  Denies any dyspnea on exertion.  Denies any cough.  No fever or chills.  She has treated hypertension  Preventive Screening-Counseling & Management  Alcohol-Tobacco     Smoking Status: never  Allergies (verified): No Known Drug Allergies  Past History:  Past Medical History: Reviewed history from 04/20/2007 and no changes required. Psoriatic arthritis Hypertension Hypothyroidism Barrett's esophagus  Social History: Smoking Status:  never  Review of Systems       The patient complains of muscle weakness.  The patient denies anorexia, fever, weight loss, weight gain, vision loss, decreased hearing, hoarseness, chest pain, syncope, dyspnea on exertion, peripheral edema, headaches, hemoptysis, abdominal pain, melena, hematochezia, severe indigestion/heartburn, hematuria, incontinence, genital sores, suspicious skin lesions, transient blindness, difficulty walking, depression, unusual weight change, abnormal bleeding, enlarged lymph nodes, angioedema, and breast masses.    Physical  Exam  General:  overweight-appearing.  normal blood pressure, no distress Head:  Normocephalic and atraumatic without obvious abnormalities. No apparent alopecia or balding. Eyes:  No corneal or conjunctival inflammation noted. EOMI. Perrla. Funduscopic exam benign, without hemorrhages, exudates or papilledema. Vision grossly normal. Ears:  External ear exam shows no significant lesions or deformities.  Otoscopic examination reveals clear canals, tympanic membranes are intact bilaterally without bulging, retraction, inflammation or discharge. Hearing is grossly normal bilaterally. Mouth:  Oral mucosa and oropharynx without lesions or exudates.  Teeth in good repair. Neck:  No deformities, masses, or tenderness noted. Lungs:  rales were noted in all the lower one third lung fields bilaterally O2 saturation 92% Heart:  Normal rate and regular rhythm. S1 and S2 normal without gallop, murmur, click, rub or other extra sounds. no tachycardia   Impression & Recommendations:  Problem # 1:  PNEUMONIA, ORGANISM UNSPECIFIED (ICD-486)  Her updated medication list for this problem includes:    Levaquin 500 Mg Tabs (Levofloxacin) ..... One by mouth once daily for 10 days    Azithromycin 250 Mg Tabs (Azithromycin) .Marland Kitchen..Marland Kitchen Two initially, then one daily for 4 days the patient clinically has improved with improved oxygenation.  She still has some exertional weakness.  She is on immunosuppressant therapy.  Auscultation lungs revealed worsening congestion.  Will empirically treat with azithromycin and recheck in one month.  Will consider follow up chest x-ray.  Problem # 2:  HYPERTENSION (ICD-401.9)  Her updated medication list for this problem includes:    Benazepril-hydrochlorothiazide 20-12.5 Mg Tabs (Benazepril-hydrochlorothiazide) .Marland Kitchen... 2 every am  Complete Medication List: 1)  Prilosec 20 Mg Cpdr (Omeprazole) .Marland KitchenMarland KitchenMarland Kitchen  Take 1 capsule once a day 2)  Ultram 50 Mg Tabs (Tramadol hcl) .... Take 1 tablet by  mouth twice a day 3)  Ec-naprosyn 500 Mg Tbec (Naproxen) .... One bid 4)  Sulfasalazine 500 Mg Tbec (Sulfasalazine) .... Two am 5)  Amlodipine Besylate 10 Mg Tabs (amlodipine Besylate)  .Marland Kitchen.. 1 once daily 6)  Enbrel 25 Mg/0.52ml Soln (Etanercept) .... Weekly 7)  Tylenol 325 Mg Tabs (Acetaminophen) .... 3 tid 8)  Benazepril-hydrochlorothiazide 20-12.5 Mg Tabs (Benazepril-hydrochlorothiazide) .... 2 every am 9)  Citalopram Hydrobromide 40 Mg Tabs (Citalopram hydrobromide) .... One daily 10)  Synthroid 175 Mcg Tabs (Levothyroxine sodium) .... One daily 11)  Levaquin 500 Mg Tabs (Levofloxacin) .... One by mouth once daily for 10 days 12)  Azithromycin 250 Mg Tabs (Azithromycin) .... Two initially, then one daily for 4 days  Patient Instructions: 1)  Please schedule a follow-up appointment in 1 month. 2)  Take your antibiotic as prescribed until ALL of it is gone, but stop if you develop a rash or swelling and contact our office as soon as possible. 3)  call if any clinical worsening progressive weakness, or shortness of breath Prescriptions: SYNTHROID 175 MCG TABS (LEVOTHYROXINE SODIUM) one daily  #90 x 6   Entered and Authorized by:   Gordy Savers  MD   Signed by:   Gordy Savers  MD on 02/19/2010   Method used:   Print then Give to Patient   RxID:   [id number] CITALOPRAM HYDROBROMIDE 40 MG  TABS (CITALOPRAM HYDROBROMIDE) one daily  #90 x 6   Entered and Authorized by:   Gordy Savers  MD   Signed by:   Gordy Savers  MD on 02/19/2010   Method used:   Print then Give to Patient   RxID:   [id number] BENAZEPRIL-HYDROCHLOROTHIAZIDE 20-12.5 MG  TABS (BENAZEPRIL-HYDROCHLOROTHIAZIDE) 2 every am  #180 x 6   Entered and Authorized by:   Gordy Savers  MD   Signed by:   Gordy Savers  MD on 02/19/2010   Method used:   Print then Give to Patient   RxID:   [id number] AMLODIPINE BESYLATE 10  MG  TABS (AMLODIPINE BESYLATE) 1 once daily  #90 x 6    Entered and Authorized by:   Gordy Savers  MD   Signed by:   Gordy Savers  MD on 02/19/2010   Method used:   Print then Give to Patient   RxID:   [id number] EC-NAPROSYN 500 MG  TBEC (NAPROXEN) one bid  #180 x 6   Entered and Authorized by:   Gordy Savers  MD   Signed by:   Gordy Savers  MD on 02/19/2010   Method used:   Print then Give to Patient   RxID:   [id number] ULTRAM 50 MG TABS (TRAMADOL HCL) Take 1 tablet by mouth twice a day  #180 x 6   Entered and Authorized by:   Gordy Savers  MD   Signed by:   Gordy Savers  MD on 02/19/2010   Method used:   Print then Give to Patient   RxID:   [id number] PRILOSEC 20 MG CPDR (OMEPRAZOLE) Take 1 capsule once a day  #90 x 6   Entered and Authorized by:   Gordy Savers  MD   Signed by:   Gordy Savers  MD on 02/19/2010   Method used:   Print then Give to Patient   RxID:  [id number] AZITHROMYCIN 250 MG TABS (AZITHROMYCIN) two initially, then one daily for 4 days  #6 x 0   Entered and Authorized by:   Gordy Savers  MD   Signed by:   Gordy Savers  MD on 02/19/2010   Method used:   Print then Give to Patient   RxID:   [id number]

## 2010-12-05 NOTE — Assessment & Plan Note (Signed)
Summary: fu on pneumonia/njr   Vital Signs:  Patient profile:   65 year old female Weight:      197 pounds O2 Sat:      90 % on Room air Temp:     98.6 degrees F oral BP sitting:   122 / 70  (right arm) Cuff size:   regular  Vitals Entered By: Duard Brady LPN (February 12, 2010 10:40 AM)  O2 Flow:  Room air CC: f/u - seen last week by Dr. Docia Furl - pneumonia - doing better Is Patient Diabetic? No   CC:  f/u - seen last week by Dr. Docia Furl - pneumonia - doing better.  History of Present Illness: 65 year old patient  who is seen today for follow-up.  She was seen 8 days ago with a 1 1/2 week history of large and nonproductive cough.  She was quite concerned due to a prior history of pneumonia in 2006, complicated by ARDS and ventilatory dependent respiratory failure.  Prior to this hospitalization,  She smoked for 39 years.  She does have mild chronic dyspnea on exertion, but no striking change in her exercise capacity.  She has gained a considerable weight since discontinuation of tobacco use.  She is complaining Levaquin antibiotic therapy and feels quite well.  Today.  Her cough has largely resolved.  A chest x-ray was suggestive of an . early lingular pneumonia.  She has treated hypertension, which includes ACE inhibition.  She has psoriatic arthritis.  When seen last week, she had hypoxia noted.  Preventive Screening-Counseling & Management  Alcohol-Tobacco     Smoking Status: quit  Allergies (verified): No Known Drug Allergies  Past History:  Past Medical History: Reviewed history from 04/20/2007 and no changes required. Psoriatic arthritis Hypertension Hypothyroidism Barrett's esophagus  Past Surgical History: Reviewed history from 07/11/2008 and no changes required. Carpal tunnel release Cosmetic breast surgery Abdominoplasty Cholecystectomy left knee arthroscopic surgery December 2007 left totally replacement, July 2008 gravida two, para two, aborta  zero  negative ETT 2005 2-D echocardiogram 2006  Review of Systems       The patient complains of prolonged cough.  The patient denies anorexia, fever, weight loss, weight gain, vision loss, decreased hearing, hoarseness, chest pain, syncope, dyspnea on exertion, peripheral edema, headaches, hemoptysis, abdominal pain, melena, hematochezia, severe indigestion/heartburn, hematuria, incontinence, genital sores, muscle weakness, suspicious skin lesions, transient blindness, difficulty walking, depression, unusual weight change, abnormal bleeding, enlarged lymph nodes, angioedema, and breast masses.    Physical Exam  General:  overweight comfortable, no distress.  Blood pressure normal; pulse rate 82 Head:  Normocephalic and atraumatic without obvious abnormalities. No apparent alopecia or balding. Eyes:  No corneal or conjunctival inflammation noted. EOMI. Perrla. Funduscopic exam benign, without hemorrhages, exudates or papilledema. Vision grossly normal. Mouth:  Oral mucosa and oropharynx without lesions or exudates.  Teeth in good repair. Neck:  No deformities, masses, or tenderness noted. Lungs:  normal respiratory effort, no intercostal retractions, no accessory muscle use, normal breath sounds, and no wheezes.  O2 saturation 90% Heart:  Normal rate and regular rhythm. S1 and S2 normal without gallop, murmur, click, rub or other extra sounds. no resting tachycardia Abdomen:  Bowel sounds positive,abdomen soft and non-tender without masses, organomegaly or hernias noted. Msk:  No deformity or scoliosis noted of thoracic or lumbar spine.   Pulses:  R and L carotid,radial,femoral,dorsalis pedis and posterior tibial pulses are full and equal bilaterally Extremities:  No clubbing, cyanosis, edema, or deformity noted with normal full range  of motion of all joints.     Impression & Recommendations:  Problem # 1:  HYPOXEMIA (ICD-799.02)  Orders: Venipuncture (16109) TLB-CBC Platelet -  w/Differential (85025-CBCD) T-D-Dimer Fibrin Derivatives Quantitive [phone]) if d-dimer is positive, will set up for a CTA of the chest.  If hypoxemia persists, will consider a high resolution CT.  probably has significant component of COPD;  rule out interstitial lung disease due to her psoriasis; when clinically stable.  Will set up for pulmonary function studies  Problem # 2:  COUGH (ICD-786.2)  Orders: Venipuncture (91478) TLB-CBC Platelet - w/Differential (85025-CBCD) T-D-Dimer Fibrin Derivatives Quantitive ([phone])  Problem # 3:  HYPERTENSION (ICD-401.9)  Her updated medication list for this problem includes:    Benazepril-hydrochlorothiazide 20-12.5 Mg Tabs (Benazepril-hydrochlorothiazide) .Marland Kitchen... 2 every am  Her updated medication list for this problem includes:    Benazepril-hydrochlorothiazide 20-12.5 Mg Tabs (Benazepril-hydrochlorothiazide) .Marland Kitchen... 2 every am  Complete Medication List: 1)  Prilosec 20 Mg Cpdr (Omeprazole) .... Take 1 capsule once a day 2)  Ultram 50 Mg Tabs (Tramadol hcl) .... Take 1 tablet by mouth twice a day 3)  Ec-naprosyn 500 Mg Tbec (Naproxen) .... One bid 4)  Sulfasalazine 500 Mg Tbec (Sulfasalazine) .... Two am 5)  Amlodipine Besylate 10 Mg Tabs (amlodipine Besylate)  .Marland Kitchen.. 1 once daily 6)  Enbrel 25 Mg/0.68ml Soln (Etanercept) .... Weekly 7)  Tylenol 325 Mg Tabs (Acetaminophen) .... 3 tid 8)  Benazepril-hydrochlorothiazide 20-12.5 Mg Tabs (Benazepril-hydrochlorothiazide) .... 2 every am 9)  Citalopram Hydrobromide 40 Mg Tabs (Citalopram hydrobromide) .... One daily 10)  Synthroid 175 Mcg Tabs (Levothyroxine sodium) .... One daily 11)  Levaquin 500 Mg Tabs (Levofloxacin) .... One by mouth once daily for 10 days  Patient Instructions: 1)  Drink as much fluid as you can tolerate for the next few days. 2)  Take your antibiotic as prescribed until ALL of it is gone, but stop if you develop a rash or swelling and contact our office as soon as  possible. 3)  Symbicort 2 puffs twicedaily 4)  Please schedule a follow-up appointment in 1 week

## 2010-12-05 NOTE — Progress Notes (Signed)
Summary: pt retruned your call   Phone Note Call from Patient   Caller: patient vm live Call For: K  Summary of Call: Dr Kirtland Bouchard called her yesterday afternoon.  The confusion about the meds.  She is at work [phone] hit #4 then 1 and it will go to her department.   Home by 5:30.  She has received a copy of a letter dated march 10th.  It is denying her Micardis HCT.  She needs a sub.  Has talked to Aram Beecham and has not heard from her since the 17th.   Initial call taken by: Roselle Locus,  January 26, 2008 9:16 AM    New/Updated Medications: BENAZEPRIL-HYDROCHLOROTHIAZIDE 20-12.5 MG  TABS (BENAZEPRIL-HYDROCHLOROTHIAZIDE) 2 every am RX benazapril  20/12.5  Prescriptions: BENAZEPRIL-HYDROCHLOROTHIAZIDE 20-12.5 MG  TABS (BENAZEPRIL-HYDROCHLOROTHIAZIDE) 2 every am  #180 x 6   Entered and Authorized by:   Gordy Savers  MD   Signed by:   Gordy Savers  MD on 01/26/2008   Method used:   Print then Give to Patient   RxID:   [id number]

## 2010-12-05 NOTE — Assessment & Plan Note (Signed)
Summary: 1 month rov/njr/pt rsc/cjr   Vital Signs:  Patient profile:   65 year old female Weight:      200 pounds Temp:     98.8 degrees F oral BP sitting:   140 / 72  (left arm) Cuff size:   regular  Vitals Entered By: Duard Brady LPN (Mar 19, 2010 3:25 PM) CC: 1 mos rov - ok , O2 sats still having hard time staying up  Is Patient Diabetic? No   CC:  1 mos rov - ok  and O2 sats still having hard time staying up .  History of Present Illness: 65 year old patient who was initially seen here approximately 5 weeks ago.  At that time.  She gave a 1 1/2 week history of cough.  A chest x-ray revealed a lingular infiltrate and she was treated with Levaquin initially.  When she returned for follow-up 8 days later, she had persistent desaturation with clear lung fields.  A subsequent d-dimer was elevated and a chest CTA was negative for PE, but revealed bilateral infiltrates.  she was then treated with azithromycin.  The patient has a history of psoriasis and has been on Embrel.  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  She was seen in follow-up one month ago feeling well, but still with residual hypoxemia.  Clinical exam at that time revealed basilar rales.  Today, she again presents for follow-up.  She denies much in the way of cough, but does state that for the past 6 months, she has had worsening dyspnea on exertion.  She attributes this to a significant weight gain since discontinuing tobacco use in 2006.  Allergies (verified): No Known Drug Allergies  Past History:  Past Medical History: Psoriatic arthritis Hypertension Hypothyroidism Barrett's esophagus history of pneumococcal pneumonia and ARDS 2006  Past Surgical History: Reviewed history from 07/11/2008 and no changes required. Carpal tunnel release Cosmetic breast surgery Abdominoplasty Cholecystectomy left knee arthroscopic surgery December  2007 left totally replacement, July 2008 gravida two, para two, aborta zero  negative ETT 2005 2-D echocardiogram 2006  Review of Systems       The patient complains of weight gain and dyspnea on exertion.  The patient denies anorexia, fever, weight loss, vision loss, decreased hearing, hoarseness, chest pain, syncope, peripheral edema, prolonged cough, headaches, hemoptysis, abdominal pain, melena, hematochezia, severe indigestion/heartburn, hematuria, incontinence, genital sores, muscle weakness, suspicious skin lesions, transient blindness, difficulty walking, depression, unusual weight change, abnormal bleeding, enlarged lymph nodes, angioedema, and breast masses.    Physical Exam  General:  overweight-appearing.  blood pressure 120/70 Head:  Normocephalic and atraumatic without obvious abnormalities. No apparent alopecia or balding. Neck:  No deformities, masses, or tenderness noted. Lungs:  scattered bibasilar rales O2 saturation 91% Heart:  Normal rate and regular rhythm. S1 and S2 normal without gallop, murmur, click, rub or other extra sounds. no resting tachycardia Msk:  No deformity or scoliosis noted of thoracic or lumbar spine.   Extremities:  No clubbing, cyanosis, edema, or deformity noted with normal full range of motion of all joints.     Impression & Recommendations:  Problem # 1:  HYPOXEMIA (ICD-799.02)  suspect this patient has interstitial lung disease.  Will proceed with full pulmonary  function studies to include lung volumes  and diffusing capacity.  Will set her up for a pulmonary consultation we will obtain a follow-up chest x-ray or consider HRCT scan  Orders: Pulmonary Referral (Pulmonary) Misc.  Referral (Misc. Ref)  Problem # 2:  PSORIATIC ARTHRITIS (ICD-696.0)  Orders: Pulmonary Referral (Pulmonary) Misc. Referral (Misc. Ref)  Complete Medication List: 1)  Prilosec 20 Mg Cpdr (Omeprazole) .... Take 1 capsule once a day 2)  Ultram 50 Mg Tabs  (Tramadol hcl) .... Take 1 tablet by mouth twice a day 3)  Ec-naprosyn 500 Mg Tbec (Naproxen) .... One bid 4)  Sulfasalazine 500 Mg Tbec (Sulfasalazine) .... Two am 5)  Amlodipine Besylate 10 Mg Tabs (amlodipine Besylate)  .Marland Kitchen.. 1 once daily 6)  Enbrel 25 Mg/0.57ml Soln (Etanercept) .... Weekly 7)  Tylenol 325 Mg Tabs (Acetaminophen) .... 3 tid 8)  Benazepril-hydrochlorothiazide 20-12.5 Mg Tabs (Benazepril-hydrochlorothiazide) .... 2 every am 9)  Citalopram Hydrobromide 40 Mg Tabs (Citalopram hydrobromide) .... One daily 10)  Synthroid 175 Mcg Tabs (Levothyroxine sodium) .... One daily 11)  Levaquin 500 Mg Tabs (Levofloxacin) .... One by mouth once daily for 10 days 12)  Azithromycin 250 Mg Tabs (Azithromycin) .... Two initially, then one daily for 4 days 13)  Aleve 220 Mg Tabs (Naproxen sodium) .... 2 by mouth bid  Patient Instructions: 1)  pulmonary  consultation as scheduled 2)  pulmonary function studies, as scheduled 3)  Call if there is any clinical deterioration 4)  Limit your Sodium (Salt) to less than 2 grams a day(slightly less than 1/2 a teaspoon) to prevent fluid retention, swelling, or worsening of symptoms. 5)  It is important that you exercise regularly at least 20 minutes 5 times a week. If you develop chest pain, have severe difficulty breathing, or feel very tired , stop exercising immediately and seek medical attention. 6)  You need to lose weight. Consider a lower calorie diet and regular exercise.

## 2010-12-05 NOTE — Progress Notes (Signed)
Summary: oxygen  Phone Note Call from Patient Call back at Home Phone [phone]   Caller: Patient Call For: wert Summary of Call: pt is wanting her liquid oxygen picked up [facility] said they had to get it from [place] Initial call taken by: Lacinda Axon,  October 08, 2010 12:24 PM  Follow-up for Phone Call        pt would like dr wert to send order to [facility] to have liquid o2 and backpack picked up pt is not using any daytime o2 and if she does she uses the concentrator which she would like to kep because she is still using o2 at bedtime --pls advise if ok send order to [facility]  [facility]  October 08, 2010 2:48 PM  ok with me Follow-up by: Nyoka Cowden MD,  October 08, 2010 5:50 PM  Additional Follow-up for Phone Call Additional follow up Details #1::        Order sent to [facility] for D/C of O2.  Pt informed that Christoper Allegra would be calling her to set up pick up time. Abigail Miyamoto RN  October 09, 2010 8:43 AM

## 2010-12-05 NOTE — Assessment & Plan Note (Signed)
Summary: ? PNEUMONIA SXS//CCM   Vital Signs:  Patient profile:   65 year old female Weight:      196 pounds O2 Sat:      85 % on Room air Temp:     98.5 degrees F oral BP sitting:   100 / 58  (left arm) Cuff size:   large  Vitals Entered By: Sid Falcon LPN (February 04, 1609 2:54 PM)  O2 Flow:  Room air CC: Pneumonia   History of Present Illness: Acute visit. Patient seen with one and one half week history of some cough which is mostly nonproductive. Denies any fever but possibly some chills off and on. Denies any hemoptysis. Increased fatigue. No dyspnea at rest. History of complicated pneumonia 2006 with subsequent ARDS. Very worried about recurrent symptoms. Ex-smoker. Denies any significant pleuritic pain. No lower extremity pain. No recent appetite or weight changes.  Mild nausea but no vomiting and no diarrhea.  Allergies (verified): No Known Drug Allergies  Past History:  Past Medical History: Last updated: 04/20/2007 Psoriatic arthritis Hypertension Hypothyroidism Barrett's esophagus  Social History: Last updated: 07/11/2008 Married former smoker PMH reviewed for relevance, SH/Risk Factors reviewed for relevance  Review of Systems  The patient denies anorexia, weight loss, chest pain, syncope, prolonged cough, hemoptysis, and abdominal pain.    Physical Exam  General:  Well-developed,well-nourished,in no acute distress; alert,appropriate and cooperative throughout examination Head:  Normocephalic and atraumatic without obvious abnormalities. No apparent alopecia or balding. Ears:  External ear exam shows no significant lesions or deformities.  Otoscopic examination reveals clear canals, tympanic membranes are intact bilaterally without bulging, retraction, inflammation or discharge. Hearing is grossly normal bilaterally. Nose:  no nasal discharge.   Mouth:  Oral mucosa and oropharynx without lesions or exudates.  Teeth in good repair. Neck:  No deformities,  masses, or tenderness noted. Lungs:  patient has some crackles left base and a few right upper airway as well. Few very faint wheezes noted diffusely. No retractions.  No tachypnea at rest. Heart:  normal rate and regular rhythm.   Extremities:  no edema or calf tenderness Neurologic:  alert & oriented X3 and gait normal.   Skin:  no rashes. Psych:  normally interactive, good eye contact, not anxious appearing, and not depressed appearing.     Impression & Recommendations:  Problem # 1:  HYPOXEMIA (ICD-799.02) rule out pneumonia.  Low O2 but no increased work of breathing. Orders: T-2 View CXR (71020TC)  Problem # 2:  COUGH (ICD-786.2)  rule out recurrent pneumonia.  Check CXR and start Levaquin 500 mg by mouth once daily   Orders: T-2 View CXR (71020TC)  Complete Medication List: 1)  Prilosec 20 Mg Cpdr (Omeprazole) .... Take 1 capsule once a day 2)  Ultram 50 Mg Tabs (Tramadol hcl) .... Take 1 tablet by mouth twice a day 3)  Ec-naprosyn 500 Mg Tbec (Naproxen) .... One bid 4)  Sulfasalazine 500 Mg Tbec (Sulfasalazine) .... Two am 5)  Amlodipine Besylate 10 Mg Tabs (amlodipine Besylate)  .Marland Kitchen.. 1 once daily 6)  Enbrel 25 Mg/0.75ml Soln (Etanercept) .... Weekly 7)  Tylenol 325 Mg Tabs (Acetaminophen) .... 3 tid 8)  Benazepril-hydrochlorothiazide 20-12.5 Mg Tabs (Benazepril-hydrochlorothiazide) .... 2 every am 9)  Citalopram Hydrobromide 40 Mg Tabs (Citalopram hydrobromide) .... One daily 10)  Synthroid 175 Mcg Tabs (Levothyroxine sodium) .... One daily 11)  Levaquin 500 Mg Tabs (Levofloxacin) .... One by mouth once daily for 10 days  Patient Instructions: 1)  Schedule followup appointment  with Dr. Amador Cunas in one week follow up sooner if you notice any increased shortness of breath or any other problems Prescriptions: LEVAQUIN 500 MG TABS (LEVOFLOXACIN) one by mouth once daily for 10 days  #10 x 0   Entered and Authorized by:   Evelena Peat MD   Signed by:   Evelena Peat  MD on 02/04/2010   Method used:   Print then Give to Patient   RxID:   [id number]

## 2010-12-05 NOTE — Progress Notes (Signed)
Summary: orders for [facility] on O2  Phone Note Outgoing Call   Call placed by: Duard Brady LPN,  July 05, 2010 9:11 AM Call placed to: [facility] Summary of Call: called [facility] - [phone] - spoke with carol - pt needs O2 @2 -3l/min continuous at home and portable for work and outings.  gave pt cell # - pt aware that if they have not heard from [facility] by 1pm to call me today. KIK Initial call taken by: Duard Brady LPN,  July 05, 2010 9:14 AM

## 2010-12-05 NOTE — Consult Note (Signed)
Summary: [facility]   Imported By: Maryln Gottron 07/13/2008 14:03:02  _____________________________________________________________________  External Attachment:    Type:   Image     Comment:   External Document

## 2010-12-05 NOTE — Assessment & Plan Note (Signed)
Summary: 2 wks ov/nta   Vital Signs:  Patient Profile:   65 Years Old Female Weight:      178 pounds Temp:     98.1 degrees F oral BP sitting:   178 / 88  (left arm)  Vitals Entered By: [name], RN (September 21, 2007 3:45 PM)                 Chief Complaint:  F/u BP.Marland Kitchen  History of Present Illness: 65 year old patient seen today for follow-up of her hypertension.  Home blood pressure readings are unchanged.  The consistently high, especially systolics in the 175 range  Current Allergies: No known allergies       Physical Exam  General:     Well-developed,well-nourished,in no acute distress; alert,appropriate and cooperative throughout examination  blood pressure 174 over hundred    Impression & Recommendations:  Problem # 1:  HYPERTENSION (ICD-401.9) lot [id number], EXP 30 jun 09, [employer] left deltoid IM, 0.5 cc.   Complete Medication List: 1)  Prilosec 20 Mg Cpdr (Omeprazole) .... Take 1 capsule once a day 2)  Ultram 50 Mg Tabs (Tramadol hcl) .... Take 1 tablet by mouth twice a day 3)  Synthroid 150 Mcg Tabs (Levothyroxine sodium) .... Not sure about dose 4)  Ec-naprosyn 500 Mg Tbec (Naproxen) .... One bid 5)  Sulfasalazine 500 Mg Tbec (Sulfasalazine) .... Two am one pm 6)  Amlodipine Besylate 10 Mg Tabs (amlodipine Besylate)  .Marland Kitchen.. 1 once daily 7)  Enbrel 25 Mg/0.25ml Soln (Etanercept) .... Weekly 8)  Tylenol 325 Mg Tabs (Acetaminophen) .... 3 tid 9)  Micardis Hct 80-25 Mg Tabs (telmisartan-hctz)  .... One daily  Other Orders: Influenza Vaccine NON MCR ([id number])   Patient Instructions: 1)  Please schedule a follow-up appointment in 1 month. 2)  Limit your Sodium (Salt) to less than 2 grams a day(slightly less than 1/2 a teaspoon) to prevent fluid retention, swelling, or worsening of symptoms. 3)  It is important that you exercise regularly at least 20 minutes 5 times a week. If you develop chest pain, have severe difficulty breathing, or feel very  tired , stop exercising immediately and seek medical attention. 4)  You need to lose weight. Consider a lower calorie diet and regular exercise.     Prescriptions: AMLODIPINE BESYLATE 10  MG  TABS (AMLODIPINE BESYLATE) 1 once daily  #90 x 6   Entered and Authorized by:   Gordy Savers  MD   Signed by:   Gordy Savers  MD on 09/21/2007   Method used:   Print then Give to Patient   RxID:   [id number]  ]  Influenza Vaccine    Vaccine Type: Fluvax Non-MCR    Given by: [name], RN  Flu Vaccine Consent Questions    Do you have a history of severe allergic reactions to this vaccine? no    Any prior history of allergic reactions to egg and/or gelatin? no    Do you have a sensitivity to the preservative Thimersol? no    Do you have a past history of Guillan-Barre Syndrome? no    Do you currently have an acute febrile illness? no    Have you ever had a severe reaction to latex? no    Vaccine information given and explained to patient? yes    Are you currently pregnant? no

## 2010-12-05 NOTE — Letter (Signed)
Summary: Letter  Letter   Imported By: Kassie Mends 06/17/2007 09:27:10  _____________________________________________________________________  External Attachment:    Type:   Image     Comment:   letter

## 2010-12-05 NOTE — Progress Notes (Signed)
Summary: [facility]-Nurse Report    [facility] Triage Call Report Triage Record Num: [id number] Operator: Freddie Breech Patient Name: [name] Call Date & Time: 02/03/2010 9:14:15AM Patient Phone: [phone] PCP: Gordy Savers Patient Gender: Female PCP Fax : [phone] Patient DOB: [birth year] [facility] Name: [facility] Reason for Call: C/o mid back pain > on L, intermittent dry cough x 2 wks. Breathing easy. Hx RDS 2006. Advised call ofc in AM for an appt. Protocol(s) Used: Cough - Adult Recommended Outcome per Protocol: See Provider within 24 hours Reason for Outcome: Gradual onset of cough when lying down AND relieved after being in a sitting or standing position Care Advice: Limit or avoid exposure to irritants and allergens (e.g. air pollution, smoke/smoking, chemicals, dust, pollen, pet dander, etc.)  ~  ~ Avoid any activity that produces symptoms until evaluated by provider.  ~ Sleep with head elevated on several pillows or in a recliner. Go to ED IMMEDIATELY if developing increased shortness of breath, continuous cough, worsening fatigue, or unable to perform ADLs.  ~ Call EMS 911 if having chest pain lasting more than 5 minutes, shortness of breath that makes walking and talking very difficult, very fast or irregular heartbeat, breathing hard and fast, or lips or nails a blue/grey color.  ~ Provide for rest periods by spacing activities that require physical exertion. Promote calm, restful environment; anxiety increases breathing difficulty.  ~  ~ SYMPTOM / CONDITION MANAGEMENT  ~ Call provider if develop sudden weight gain, swelling of feet and/or pain in the abdomen, fatigue or trouble sleeping. 02/03/2010 9:27:37AM Page 1 of 1 [id number]

## 2010-12-05 NOTE — Progress Notes (Signed)
Summary: [facility] note  Phone Note Call from Patient   Caller: Patient Call For: Gordy Savers  MD Summary of Call: Pt needs note to return to work Monday.  Discharging MD gave her permission but she forgot to ask for a note. [phone] Initial call taken by: Lynann Beaver CMA,  July 04, 2010 4:41 PM  Follow-up for Phone Call        ask patient to drop by [place] Follow-up by: Gordy Savers  MD,  July 04, 2010 5:03 PM  Additional Follow-up for Phone Call Additional follow up Details #1::        Notified pt. Additional Follow-up by: Lynann Beaver CMA,  July 04, 2010 5:11 PM

## 2010-12-05 NOTE — Assessment & Plan Note (Signed)
Summary: thyroid low   Vital Signs:  Patient profile:   65 year old female Weight:      192 pounds Temp:     98.2 degrees F BP sitting:   150 / 78  (left arm) Cuff size:   regular  Vitals Entered By: [name], RN (July 03, 2009 10:18 AM) CC: Thyroid low and feels sluggish.   CC:  Thyroid low and feels sluggish..  History of Present Illness: 65 -year-old patient who is seen today for follow-up of her hypothyroidism.  She has treated hypertension.  She was seen by rheumatology recently and noted to have an elevated TSH of 11.  She has been on Synthroid.  150 micrograms daily, and has been compliant.  No concerns or complaints.  She has noticed some slight increased fatigue and over the past year.  There has been some modest additional weight gain.  Her hypertension has been stable  Problems Prior to Update: 1)  Preventive Health Care  (ICD-V70.0) 2)  Psoriatic Arthritis  (ICD-696.0) 3)  Hypothyroidism  (ICD-244.9) 4)  Hypertension  (ICD-401.9)  Medications Prior to Update: 1)  Prilosec 20 Mg Cpdr (Omeprazole) .... Take 1 Capsule Once A Day 2)  Ultram 50 Mg Tabs (Tramadol Hcl) .... Take 1 Tablet By Mouth Twice A Day 3)  Synthroid 150 Mcg  Tabs (Levothyroxine Sodium) .Marland Kitchen.. 1 Once Daily 4)  Ec-Naprosyn 500 Mg  Tbec (Naproxen) .... One Bid 5)  Sulfasalazine 500 Mg  Tbec (Sulfasalazine) .... Two Am 6)  Amlodipine Besylate 10  Mg  Tabs (Amlodipine Besylate) .Marland Kitchen.. 1 Once Daily 7)  Enbrel 25 Mg/0.71ml  Soln (Etanercept) .... Weekly 8)  Tylenol 325 Mg  Tabs (Acetaminophen) .... 3 Tid 9)  Benazepril-Hydrochlorothiazide 20-12.5 Mg  Tabs (Benazepril-Hydrochlorothiazide) .... 2 Every Am 10)  Citalopram Hydrobromide 40 Mg  Tabs (Citalopram Hydrobromide) .... One Daily  Allergies: No Known Drug Allergies  Past History:  Past Medical History: Reviewed history from 04/20/2007 and no changes required. Psoriatic arthritis Hypertension Hypothyroidism Barrett's esophagus  Physical  Exam  General:  overweight-appearing.  normal blood pressureoverweight-appearing.   Head:  Normocephalic and atraumatic without obvious abnormalities. No apparent alopecia or balding. Eyes:  No corneal or conjunctival inflammation noted. EOMI. Perrla. Funduscopic exam benign, without hemorrhages, exudates or papilledema. Vision grossly normal. Mouth:  Oral mucosa and oropharynx without lesions or exudates.  Teeth in good repair. Neck:  no thyromegaly Lungs:  Normal respiratory effort, chest expands symmetrically. Lungs are clear to auscultation, no crackles or wheezes. Heart:  Normal rate and regular rhythm. S1 and S2 normal without gallop, murmur, click, rub or other extra sounds. Abdomen:  Bowel sounds positive,abdomen soft and non-tender without masses, organomegaly or hernias noted. Msk:  No deformity or scoliosis noted of thoracic or lumbar spine.   Pulses:  R and L carotid,radial,femoral,dorsalis pedis and posterior tibial pulses are full and equal bilaterally Extremities:  No clubbing, cyanosis, edema, or deformity noted with normal full range of motion of all joints.     Impression & Recommendations:  Problem # 1:  HYPOTHYROIDISM (ICD-244.9)  The following medications were removed from the medication list:    Synthroid 150 Mcg Tabs (Levothyroxine sodium) .Marland Kitchen... 1 once daily Her updated medication list for this problem includes:    Synthroid 175 Mcg Tabs (Levothyroxine sodium) ..... One daily  The following medications were removed from the medication list:    Synthroid 150 Mcg Tabs (Levothyroxine sodium) .Marland Kitchen... 1 once daily Her updated medication list for this problem  includes:    Synthroid 175 Mcg Tabs (Levothyroxine sodium) ..... One daily  Problem # 2:  HYPERTENSION (ICD-401.9)  Her updated medication list for this problem includes:    Benazepril-hydrochlorothiazide 20-12.5 Mg Tabs (Benazepril-hydrochlorothiazide) .Marland Kitchen... 2 every am  Her updated medication list for this  problem includes:    Benazepril-hydrochlorothiazide 20-12.5 Mg Tabs (Benazepril-hydrochlorothiazide) .Marland Kitchen... 2 every am  Complete Medication List: 1)  Prilosec 20 Mg Cpdr (Omeprazole) .... Take 1 capsule once a day 2)  Ultram 50 Mg Tabs (Tramadol hcl) .... Take 1 tablet by mouth twice a day 3)  Ec-naprosyn 500 Mg Tbec (Naproxen) .... One bid 4)  Sulfasalazine 500 Mg Tbec (Sulfasalazine) .... Two am 5)  Amlodipine Besylate 10 Mg Tabs (amlodipine Besylate)  .Marland Kitchen.. 1 once daily 6)  Enbrel 25 Mg/0.63ml Soln (Etanercept) .... Weekly 7)  Tylenol 325 Mg Tabs (Acetaminophen) .... 3 tid 8)  Benazepril-hydrochlorothiazide 20-12.5 Mg Tabs (Benazepril-hydrochlorothiazide) .... 2 every am 9)  Citalopram Hydrobromide 40 Mg Tabs (Citalopram hydrobromide) .... One daily 10)  Synthroid 175 Mcg Tabs (Levothyroxine sodium) .... One daily  Patient Instructions: 1)  Please schedule a follow-up appointment in 3 months. 2)  Limit your Sodium (Salt). 3)  It is important that you exercise regularly at least 20 minutes 5 times a week. If you develop chest pain, have severe difficulty breathing, or feel very tired , stop exercising immediately and seek medical attention. 4)  You need to lose weight. Consider a lower calorie diet and regular exercise.  Prescriptions: SYNTHROID 175 MCG TABS (LEVOTHYROXINE SODIUM) one daily  #90 x 0   Entered and Authorized by:   Gordy Savers  MD   Signed by:   Gordy Savers  MD on 07/03/2009   Method used:   Print then Give to Patient   RxID:   [id number] CITALOPRAM HYDROBROMIDE 40 MG  TABS (CITALOPRAM HYDROBROMIDE) one daily  #90 x 6   Entered and Authorized by:   Gordy Savers  MD   Signed by:   Gordy Savers  MD on 07/03/2009   Method used:   Print then Give to Patient   RxID:   [id number] BENAZEPRIL-HYDROCHLOROTHIAZIDE 20-12.5 MG  TABS (BENAZEPRIL-HYDROCHLOROTHIAZIDE) 2 every am  #180 x 6   Entered and Authorized by:   Gordy Savers  MD    Signed by:   Gordy Savers  MD on 07/03/2009   Method used:   Print then Give to Patient   RxID:   [id number] AMLODIPINE BESYLATE 10  MG  TABS (AMLODIPINE BESYLATE) 1 once daily  #90 x 6   Entered and Authorized by:   Gordy Savers  MD   Signed by:   Gordy Savers  MD on 07/03/2009   Method used:   Print then Give to Patient   RxID:   [id number] EC-NAPROSYN 500 MG  TBEC (NAPROXEN) one bid  #180 x 6   Entered and Authorized by:   Gordy Savers  MD   Signed by:   Gordy Savers  MD on 07/03/2009   Method used:   Print then Give to Patient   RxID:   [id number] ULTRAM 50 MG TABS (TRAMADOL HCL) Take 1 tablet by mouth twice a day  #180 x 6   Entered and Authorized by:   Gordy Savers  MD   Signed by:   Gordy Savers  MD on 07/03/2009   Method used:   Print then Give to Patient   RxID:  [id number] PRILOSEC 20 MG CPDR (OMEPRAZOLE) Take 1 capsule once a day  #90 x 1   Entered and Authorized by:   Gordy Savers  MD   Signed by:   Gordy Savers  MD on 07/03/2009   Method used:   Print then Give to Patient   RxID:   [id number]

## 2010-12-05 NOTE — Assessment & Plan Note (Signed)
Summary: ? COPD??dm   Vital Signs:  Huff profile:   65 year old female Weight:      200 pounds O2 Sat:      85 % on Room air Temp:     98.4 degrees F oral BP sitting:   118 / 72  (right arm) Cuff size:   regular  Vitals Entered By: Duard Brady LPN (July 02, 2010 9:06 AM)  O2 Flow:  Room air CC: c/o SOB with exercise , cough , nervous feeling Is Huff Diabetic? No   CC:  c/o SOB with exercise , cough , and nervous feeling.  History of Present Illness: [name]  who was hospitalized in 2006 for pneumococcal  community acquired  pneumonia complicated by ARDS requiring prolonged ventilatory support.  Since that time, she has had some dyspnea on exertion. Over the past 3 to 4 weeks, she has had significant worsening of her dyspnea on exertion with limitation in exercise capacity.  She has developed a cough which has been productive of dark and sputum.  There's been no fever, chills, or wheezing.  She also had fairly predictable substernal chest pain with exertion.  A chest CT in April revealed bilateral pulmonary infiltrates, consistent with pneumonia as well is coronary artery calcification.  Her father died of complications of coronary artery disease.  She states that home O2 saturations are consistently less than 90.  She does have a history of psoriatic arthritis on chronic Embrel therapy. Office evaluation revealed an O2 saturation in the mid-80s, but dropped to the mid 70s with modest exercise.  She developed substernal pressure, chest pain. She is now admitted to the hospital for further evaluation of suspected new onset angina as well as to further evaluate her worsening hypoxic respiratory  insufficiency.  Preventive Screening-Counseling & Management  Alcohol-Tobacco     Smoking Status: quit  Allergies (verified): No Known Drug Allergies  Past History:  Past Medical History: Reviewed history from 03/19/2010 and no changes required. Psoriatic  arthritis Hypertension Hypothyroidism Barrett's esophagus history of pneumococcal pneumonia and ARDS 2006  Past Surgical History: Carpal tunnel release Cosmetic breast surgery Abdominoplasty Cholecystectomy left knee arthroscopic surgery December 2007 left totally replacement, July 2008 gravida two, para two, aborta zero  negative ETT 2005 2-D echocardiogram 2006 PFT's 6-11:  mild obstructive  defect; moderate decrease in diffusing capacity; no response to bronchodilators.  Normal.  Lung findings  Chest CT April 2011- bilateral pulmonary infiltrates; coronary artery calcification  Family History: Reviewed history from 07/11/2008 and no changes required. father died at age  89 of coronary artery disease.  mother died at 49 of breast cancer.   One sister, status post a gastric bypass  Social History: Reviewed history from 07/11/2008 and no changes required. Married former smokerSmoking Status:  quit  Review of Systems       The Huff complains of weight gain, chest pain, dyspnea on exertion, and prolonged cough.  The Huff denies anorexia, fever, weight loss, vision loss, decreased hearing, hoarseness, syncope, peripheral edema, headaches, hemoptysis, abdominal pain, melena, hematochezia, severe indigestion/heartburn, hematuria, incontinence, genital sores, muscle weakness, suspicious skin lesions, transient blindness, difficulty walking, depression, unusual weight change, abnormal bleeding, enlarged lymph nodes, angioedema, and breast masses.    Physical Exam  General:  overweight-appearing.  blood pressure on arrival 118/72overweight-appearing.   Head:  Normocephalic and atraumatic without obvious abnormalities. No apparent alopecia or balding. Eyes:  No corneal or conjunctival inflammation noted. EOMI. Perrla. Funduscopic exam benign, without hemorrhages, exudates  or papilledema. Vision grossly normal. Ears:  External ear exam shows no significant lesions or deformities.   Otoscopic examination reveals clear canals, tympanic membranes are intact bilaterally without bulging, retraction, inflammation or discharge. Hearing is grossly normal bilaterally. Mouth:  Oral mucosa and oropharynx without lesions or exudates.  Teeth in good repair. Neck:  No deformities, masses, or tenderness noted. Chest Wall:  No deformities, masses, or tenderness noted. Lungs:  pulmonary  rales in the lower one half lung field; scatter rhonchi O2 saturation at rest 86 O2 saturation after a brief walk 75 and 77 Heart:  Normal rate and regular rhythm. S1 and S2 normal without gallop, murmur, click, rub or other extra sounds. Abdomen:  Bowel sounds positive,abdomen soft and non-tender without masses, organomegaly or hernias noted. Msk:  No deformity or scoliosis noted of thoracic or lumbar spine.   Pulses:  R and L carotid,radial,femoral,dorsalis pedis and posterior tibial pulses are full and equal bilaterally Extremities:  No clubbing, cyanosis, edema, or deformity noted with normal full range of motion of all joints.   Skin:  Intact without suspicious lesions or rashes Cervical Nodes:  No lymphadenopathy noted   Impression & Recommendations:  Problem # 1:  HYPOXEMIA (ICD-799.02) suspect this Huff may have chronic interstitial lung disease, possibly related to her psoriasis.  She now has worsening hypoxemia, probably secondary to superimposed infection  Problem # 2:  CHEST PAIN (ICD-786.50) this is consistent with unstable angina pectoris aggravated by worsening hypoxemia.  She does have a history of coronary artery calcification and will need further cardiac evaluation.  Problem # 3:  COUGH (ICD-786.2) a chest x-ray will be reviewed  Problem # 4:  PSORIATIC ARTHRITIS (ICD-696.0)  Problem # 5:  HYPERTENSION (ICD-401.9)  Her updated medication list for this problem includes:    Benazepril-hydrochlorothiazide 20-Amber.5 Mg Tabs (Benazepril-hydrochlorothiazide) .Marland Kitchen... 2 every  am  Her updated medication list for this problem includes:    Benazepril-hydrochlorothiazide 20-Amber.5 Mg Tabs (Benazepril-hydrochlorothiazide) .Marland Kitchen... 2 every am  Her updated medication list for this problem includes:    Benazepril-hydrochlorothiazide 20-Amber.5 Mg Tabs (Benazepril-hydrochlorothiazide) .Marland Kitchen... 2 every am  Complete Medication List: 1)  Prilosec 20 Mg Cpdr (Omeprazole) .... Take 1 capsule once a day 2)  Ultram 50 Mg Tabs (Tramadol hcl) .... Take 1 tablet by mouth twice a day 3)  Sulfasalazine 500 Mg Tbec (Sulfasalazine) .... Two am 4)  Amlodipine Besylate 10 Mg Tabs (amlodipine Besylate)  .Marland Kitchen.. 1 once daily 5)  Enbrel 25 Mg/0.34ml Soln (Etanercept) .... Weekly 6)  Tylenol 325 Mg Tabs (Acetaminophen) .... 3 tid 7)  Benazepril-hydrochlorothiazide 20-Amber.5 Mg Tabs (Benazepril-hydrochlorothiazide) .... 2 every am 8)  Citalopram Hydrobromide 40 Mg Tabs (Citalopram hydrobromide) .... One daily 9)  Synthroid 175 Mcg Tabs (Levothyroxine sodium) .... One daily 10)  Aleve 220 Mg Tabs (Naproxen sodium) .... 2 by mouth bid  Huff Instructions: 1)  the Huff will be admitted to a telemetry setting.  A chest x-ray will be reviewed.  Laboratory studies including BMP will be obtained.  The Huff will be considered for a pulmonary and cardiology consultation;  serial  markers will be obtained.

## 2010-12-05 NOTE — Assessment & Plan Note (Signed)
Summary: blood pressure check/mhf   Vital Signs:  [name] Profile:   65 Years Old Female Weight:      184 pounds Temp:     98.1 degrees F oral BP sitting:   138 / 60  (left arm) Cuff size:   regular  Vitals Entered By: [name], RN (February 15, 2008 4:26 PM)                 Chief Complaint:  ROV. Crying a lot; maybe needs higher dose Celexa.[name]  History of Present Illness: 65 year old [name] seen today for follow-up of her hypertension.  She has a history of psoriatic arthritis, anxiety, depression.  She states for the past few weeks.  She has had increasing stress, anxiety, and has become more tearful.  She relates this to a job related stress.  She is now on benazepril hydrochlorothiazide, instead of mycardis  due to noncoverage with her insurance plan    Current Allergies: No known allergies       Physical Exam  General:     overweight-appearing.  140/80 Head:     Normocephalic and atraumatic without obvious abnormalities. No apparent alopecia or balding. Eyes:     No corneal or conjunctival inflammation noted. EOMI. Perrla. Funduscopic exam benign, without hemorrhages, exudates or papilledema. Vision grossly normal. Mouth:     Oral mucosa and oropharynx without lesions or exudates.  Teeth in good repair. Neck:     No deformities, masses, or tenderness noted. Lungs:     Normal respiratory effort, chest expands symmetrically. Lungs are clear to auscultation, no crackles or wheezes. Heart:     Normal rate and regular rhythm. S1 and S2 normal without gallop, murmur, click, rub or other extra sounds.    Impression & Recommendations:  Problem # 1:  HYPERTENSION (ICD-401.9)  Her updated medication list for this problem includes:    Benazepril-hydrochlorothiazide 20-12.5 Mg Tabs (Benazepril-hydrochlorothiazide) .[name]... 2 every am   Problem # 2:  HYPOTHYROIDISM (ICD-244.9)  Her updated medication list for this problem includes:    Synthroid 150 Mcg Tabs  (Levothyroxine sodium) .[name]... 1 once daily   Complete Medication List: 1)  Prilosec 20 Mg Cpdr (Omeprazole) .... Take 1 capsule once a day 2)  Ultram 50 Mg Tabs (Tramadol hcl) .... Take 1 tablet by mouth twice a day 3)  Synthroid 150 Mcg Tabs (Levothyroxine sodium) .[name].. 1 once daily 4)  Ec-naprosyn 500 Mg Tbec (Naproxen) .... One bid 5)  Sulfasalazine 500 Mg Tbec (Sulfasalazine) .... Two am 6)  Amlodipine Besylate 10 Mg Tabs (amlodipine Besylate)  .[name].. 1 once daily 7)  Enbrel 25 Mg/0.64ml Soln (Etanercept) .... Weekly 8)  Tylenol 325 Mg Tabs (Acetaminophen) .... 3 tid 9)  Benazepril-hydrochlorothiazide 20-12.5 Mg Tabs (Benazepril-hydrochlorothiazide) .... 2 every am 10)  Citalopram Hydrobromide 40 Mg Tabs (Citalopram hydrobromide) .... One daily   [name] Instructions: 1)  Please schedule a follow-up appointment in 4 months. 2)  Limit your Sodium (Salt) to less than 2 grams a day(slightly less than 1/2 a teaspoon) to prevent fluid retention, swelling, or worsening of symptoms. 3)  It is important that you exercise regularly at least 20 minutes 5 times a week. If you develop chest pain, have severe difficulty breathing, or feel very tired , stop exercising immediately and seek medical attention. 4)  You need to lose weight. Consider a lower calorie diet and regular exercise.     Prescriptions: CITALOPRAM HYDROBROMIDE 40 MG  TABS (CITALOPRAM HYDROBROMIDE) one daily  #90 x 6  Entered and Authorized by:   Gordy Savers  MD   Signed by:   Gordy Savers  MD on 02/15/2008   Method used:   Print then Give to [name]   RxID:   [id number] BENAZEPRIL-HYDROCHLOROTHIAZIDE 20-12.5 MG  TABS (BENAZEPRIL-HYDROCHLOROTHIAZIDE) 2 every am  #180 x 6   Entered and Authorized by:   Gordy Savers  MD   Signed by:   Gordy Savers  MD on 02/15/2008   Method used:   Print then Give to [name]   RxID:   [id number] AMLODIPINE BESYLATE 10  MG  TABS (AMLODIPINE BESYLATE) 1 once  daily  #90 x 6   Entered and Authorized by:   Gordy Savers  MD   Signed by:   Gordy Savers  MD on 02/15/2008   Method used:   Print then Give to [name]   RxID:   [id number] EC-NAPROSYN 500 MG  TBEC (NAPROXEN) one bid  #180 x 6   Entered and Authorized by:   Gordy Savers  MD   Signed by:   Gordy Savers  MD on 02/15/2008   Method used:   Print then Give to [name]   RxID:   [id number] SYNTHROID 150 MCG  TABS (LEVOTHYROXINE SODIUM) 1 once daily  #90 x 6   Entered and Authorized by:   Gordy Savers  MD   Signed by:   Gordy Savers  MD on 02/15/2008   Method used:   Print then Give to [name]   RxID:   [id number] ULTRAM 50 MG TABS (TRAMADOL HCL) Take 1 tablet by mouth twice a day  #180 x 6   Entered and Authorized by:   Gordy Savers  MD   Signed by:   Gordy Savers  MD on 02/15/2008   Method used:   Print then Give to [name]   RxID:   [id number] PRILOSEC 20 MG CPDR (OMEPRAZOLE) Take 1 capsule once a day  #90 x 6   Entered and Authorized by:   Gordy Savers  MD   Signed by:   Gordy Savers  MD on 02/15/2008   Method used:   Print then Give to [name]   RxID:   [id number]

## 2010-12-05 NOTE — Progress Notes (Signed)
Summary: test results  Phone Note Call from Patient   Summary of Call: Calling about test results & whether or not she can return to work.   Initial call taken by: Rudy Jew, RN,  February 13, 2010 1:45 PM  Follow-up for Phone Call        called Follow-up by: Gordy Savers  MD,  February 14, 2010 9:19 AM

## 2010-12-05 NOTE — Progress Notes (Signed)
Summary: BP REFILL  Phone Note Call from Patient Call back at Work Phone [phone] Call back at [facility] [place]   Caller: Patient Call For: [id number] Summary of Call: NEED TO KNOW IF SHE IS TO CONTINUE THE BP MEDICATION IF SO PLEASE CALL IT IN [facility] [address]. Initial call taken by: Barnie Mort,  August 09, 2007 12:20 PM  Follow-up for Phone Call        Rx Called In Follow-up by: [name], RN,  August 09, 2007 1:33 PM    New/Updated Medications: AMLODIPINE BESYLATE 5 MG  TABS (AMLODIPINE BESYLATE) 1 once daily   Prescriptions: AMLODIPINE BESYLATE 5 MG  TABS (AMLODIPINE BESYLATE) 1 once daily  #30 x 6   Entered and Authorized by:   [name], RN   Signed by:   [name], RN on 08/09/2007   Method used:   Historical   RxID:   [id number] TRIAMTERENE-HCTZ 37.5-25 MG  CAPS (TRIAMTERENE-HCTZ) one qd  #30 x 6   Entered and Authorized by:   [name], RN   Signed by:   [name], RN on 08/09/2007   Method used:   Historical   RxID:   [id number]

## 2010-12-05 NOTE — Letter (Signed)
Summary: [facility]   Imported By: Maryln Gottron 07/04/2009 10:10:33  _____________________________________________________________________  External Attachment:    Type:   Image     Comment:   External Document

## 2010-12-05 NOTE — Progress Notes (Signed)
Summary: order for cta and stat lab  Phone Note Outgoing Call   Call placed by: Duard Brady LPN,  February 13, 2010 8:52 AM Call placed to: Patient Summary of Call: spoke with pt - will go to [facility] for stat bun/creat now - terri will call with cta time for today -   cell # [phone] if can not be reach at home #   KIK Initial call taken by: Duard Brady LPN,  February 13, 2010 8:53 AM

## 2010-12-05 NOTE — Progress Notes (Signed)
Summary: BENICAR QUESTION/CB  Phone Note Call from Patient Call back at Home Phone [phone]   Caller: Patient Call For: WERT Summary of Call: HAS A QUESTION ABOUT THE BENICAR SHE WAS PUT ON Initial call taken by: Lacinda Axon,  August 20, 2010 3:25 PM  Follow-up for Phone Call        PT CALLED BACK FOUND THE ANSWER TO HER QUESTION SHE SAID TO DELETE MSG Follow-up by: Lacinda Axon,  August 20, 2010 3:48 PM  Additional Follow-up for Phone Call Additional follow up Details #1::        noted.  Aundra Millet Reynolds LPN  August 20, 2010 3:52 PM

## 2010-12-05 NOTE — Miscellaneous (Signed)
Summary: Order Confirmation for Oxygen/[facility]  Order Confirmation for Oxygen/[facility]   Imported By: Maryln Gottron 07/16/2010 14:35:24  _____________________________________________________________________  External Attachment:    Type:   Image     Comment:   External Document

## 2010-12-05 NOTE — Progress Notes (Signed)
Summary: refill synthroid  Phone Note Refill Request   Refills Requested: Medication #1:  SYNTHROID 175 MCG TABS one daily [facility]   Method Requested: Fax to Local Pharmacy Initial call taken by: Duard Brady LPN,  October 02, 2010 12:10 PM    Prescriptions: SYNTHROID 175 MCG TABS (LEVOTHYROXINE SODIUM) one daily  #90 x 3   Entered by:   Duard Brady LPN   Authorized by:   Gordy Savers  MD   Signed by:   Duard Brady LPN on 14/78/2956   Method used:   Historical   RxID:   [id number]  faxed back to [facility] home delivery   kik

## 2010-12-05 NOTE — Miscellaneous (Signed)
Summary: Orders Update pft charges  Clinical Lists Changes  Orders: Added new Service order of Carbon Monoxide diffusing w/capacity (94720) - Signed Added new Service order of Lung Volumes (94240) - Signed Added new Service order of Spirometry (Pre & Post) (94060) - Signed 

## 2010-12-05 NOTE — Progress Notes (Signed)
Summary: cta results  Phone Note From Other Clinic   Caller: Dr. Vear Clock radiology Summary of Call: cta neg for PE - air space does suggest pneumonia - Dr. Amador Cunas informed - will f/u with pt  at home. KIK Initial call taken by: Duard Brady LPN,  February 13, 2010 12:29 PM

## 2010-12-05 NOTE — Letter (Signed)
Summary: [facility]   Imported By: Maryln Gottron 02/01/2009 13:29:27  _____________________________________________________________________  External Attachment:    Type:   Image     Comment:   External Document

## 2010-12-05 NOTE — Letter (Signed)
Summary: [facility]   Imported By: Maryln Gottron 07/23/2010 14:28:33  _____________________________________________________________________  External Attachment:    Type:   Image     Comment:   External Document

## 2010-12-05 NOTE — Assessment & Plan Note (Signed)
Summary: CPX//SLM   Vital Signs:  Patient profile:   65 year old female Height:      59 inches Weight:      193 pounds BMI:     39.12 Temp:     98.4 degrees F oral BP sitting:   110 / 68  (right arm) Cuff size:   regular  Vitals Entered By: Duard Brady LPN (July 23, 2010 1:59 PM) CC: cpx- doing well  Is Patient Diabetic? No Flu Vaccine Consent Questions     Do you have a history of severe allergic reactions to this vaccine? no    Any prior history of allergic reactions to egg and/or gelatin? no    Do you have a sensitivity to the preservative Thimersol? no    Do you have a past history of Guillan-Barre Syndrome? no    Do you currently have an acute febrile illness? no    Have you ever had a severe reaction to latex? no    Vaccine information given and explained to patient? yes    Are you currently pregnant? no    Lot Number:[id number]   Exp Date:05/03/2011   Site Given  Left Deltoid IM   Primary Care Provider:  Dr. Eleonore Chiquito  CC:  cpx- doing well .  History of Present Illness: 65 year old patient who is seen today for a health maintenance exam.  She has been followed closely by pulmonary and following a recent hospital admission.  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  She has done quite well and is now on nocturnal O2 as well as with  vigorous physical activities.  She feels quite well and has returned to work.  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 Carpal tunnel release Cosmetic breast surgery Abdominoplasty Cholecystectomy left knee arthroscopic surgery December 2007 left totally replacement, July 2008 gravida two, para two, aborta zero  negative ETT 2005 2-D echocardiogram 2006 PFT's 6-11:  mild obstructive  defect; moderate decrease in diffusing capacity; no response to bronchodilators.  Normal.  Lung findings  Chest CT April 2011- bilateral pulmonary infiltrates; coronary artery calcification  Family History: Reviewed history from 07/18/2010 and no changes required. father died at age  29 of coronary artery disease.  mother died at 52 of breast cancer.   One sister, status post a gastric bypass RA- Father  Social History: Reviewed history from 07/18/2010 and no changes required. Married Children Former smoker.  Quit in May 2006.  Smoked for approx 39 yrs up to 1 1/2 ppd No ETOH Works in the [employer] at [employer]  Review of Systems       The patient complains of dyspnea on exertion.  The patient denies anorexia, fever, weight loss, weight gain, vision loss, decreased hearing, hoarseness, chest pain, syncope, peripheral edema, prolonged cough, headaches, hemoptysis, abdominal pain, melena, hematochezia, severe indigestion/heartburn, hematuria, incontinence, genital sores, muscle weakness, suspicious skin lesions, transient blindness, difficulty walking, depression, unusual weight change, abnormal bleeding, enlarged lymph nodes, angioedema, and breast masses.    Physical Exam  General:  overweight-appearing.  blood pressure 110/70overweight-appearing.   Head:  Normocephalic and atraumatic without obvious abnormalities. No apparent alopecia or balding. Eyes:   No corneal or conjunctival inflammation noted. EOMI. Perrla. Funduscopic exam benign, without hemorrhages, exudates or papilledema. Vision grossly normal. Mouth:  Oral mucosa and oropharynx without lesions or exudates.  Teeth in good repair. Neck:  No deformities, masses, or tenderness noted. Chest Wall:  No deformities, masses, or tenderness noted. Breasts:  No mass, nodules, thickening, tenderness, bulging, retraction, inflamation, nipple discharge or skin changes noted.   Lungs:  bi-basilar crackles O2 saturation room air 93-94% Heart:  Normal rate and regular rhythm. S1 and S2 normal without gallop, murmur, click, rub or other extra sounds. Abdomen:  Bowel sounds positive,abdomen soft and non-tender without masses, organomegaly or hernias noted. Rectal:  No external abnormalities noted. Normal sphincter tone. No rectal masses or tenderness. Genitalia:  small cervical polyp Specimen obtained Msk:  No deformity or scoliosis noted of thoracic or lumbar spine.   Pulses:  R and L carotid,radial,femoral,dorsalis pedis and posterior tibial pulses are full and equal bilaterally Extremities:  no clubbing, or edema Neurologic:  cranial nerves II-XII intact, sensation intact to pinprick, and gait normal.  cranial nerves II-XII intact, sensation intact to pinprick, and gait normal.   Skin:  Intact without suspicious lesions or rashes Cervical Nodes:  No lymphadenopathy noted Axillary Nodes:  No palpable lymphadenopathy   Impression & Recommendations:  Problem # 1:  PREVENTIVE HEALTH CARE (ICD-V70.0)  Complete Medication List: 1)  Prilosec 20 Mg Cpdr (Omeprazole) .... Take  one 30-60 min before first meal of the day 2)  Ultram 50 Mg Tabs (Tramadol hcl) .... Take 1 tablet by mouth twice a day 3)  Sulfasalazine 500 Mg Tbec (Sulfasalazine) .... Two am 4)  Amlodipine Besylate 10 Mg Tabs (amlodipine Besylate)  .Marland Kitchen.. 1 once daily 5)  Enbrel 25 Mg/0.8ml Soln (Etanercept) .... Weekly 6)  Tylenol  Arthritis Pain 650 Mg Cr-tabs (Acetaminophen) .... Per bottle directions as needed 7)  Citalopram Hydrobromide 40 Mg Tabs (Citalopram hydrobromide) .... One daily 8)  Synthroid 175 Mcg Tabs (Levothyroxine sodium) .... One daily 9)  Aleve 220 Mg Tabs (Naproxen sodium) .... 2 by mouth bid 10)  Caltrate 600+d Plus 600-400 Mg-unit Tabs (Calcium carbonate-vit d-min) .Marland Kitchen.. 1 once daily 11)  Vitamin D 2000 Unit Tabs (Cholecalciferol) .Marland Kitchen.. 1 once daily 12)  Symbicort 160-4.5 Mcg/act Aero (Budesonide-formoterol fumarate) .... 2 puffs first thing  in am and 2 puffs again in pm about 12 hours later 13)  Benicar Hct 40-25 Mg Tabs (Olmesartan medoxomil-hctz) .... One tablet by mouth daily 14)  O2 2l/min Via [place] Continuous   Other Orders: Admin 1st Vaccine (16109) Flu Vaccine 57yrs + (60454)  Patient Instructions: 1)  Please schedule a follow-up appointment in 4 months. 2)  Limit your Sodium (Salt) to less than 2 grams a day(slightly less than 1/2 a teaspoon) to prevent fluid retention, swelling, or worsening of symptoms. 3)  It is important that you exercise regularly at least 20 minutes 5 times a week. If you develop chest pain, have severe difficulty breathing, or feel very tired ,  stop exercising immediately and seek medical attention. 4)  You need to lose weight. Consider a lower calorie diet and regular exercise.

## 2010-12-05 NOTE — Consult Note (Signed)
Summary: Dr Jimmy Footman note  Dr Jimmy Footman note   Imported By: Kassie Mends 01/17/2008 09:31:28  _____________________________________________________________________  External Attachment:    Type:   Image     Comment:   Dr Jimmy Footman note

## 2010-12-05 NOTE — Progress Notes (Signed)
Summary: Please schedule appt on Tuesday  (Pts only day off during week)  Phone Note Call from Patient   Caller: Patient  [phone] Summary of Call: Pt called to adv that she wanted the Referral Coordinator to know that she would like her appt set up on a Tuesday (doesn't matter what time of day)... This is the pts only day off during the week.  Initial call taken by: Debbra Riding,  Mar 20, 2010 10:20 AM  Follow-up for Phone Call        Appt scheduled for a Tuesday and [facility] for pt. Follow-up by: Corky Mull,  Mar 20, 2010 2:07 PM

## 2010-12-05 NOTE — Consult Note (Signed)
Summary: Dr Jimmy Footman note  Dr Jimmy Footman note   Imported By: Kassie Mends 02/17/2008 16:39:54  _____________________________________________________________________  External Attachment:    Type:   Image     Comment:   Dr Jimmy Footman note

## 2010-12-05 NOTE — Assessment & Plan Note (Signed)
Summary: Pulmonary/ ext f/u ov much better off ace/ still desats > 1 lap   Copy to:  Dr. Sung Amabile Primary Amelia Burgard/Referring Zennie Ayars:  Dr. Eleonore Chiquito  CC:  Dyspnea- much better.  History of Present Illness: 76 yowf quit smoking in May 2006 pna/ ards resumed full activity including yardwork  July 18, 2010  1st pulmonary office eval in ER era c/o doe x 6 months progressive indolent onset with copd vs pf.  Ultimately required hosp 8/30-9/1 dx copd/pf ? cause on sulfasalzine and ACE inhib better on 02 and advair but very hoarse with sense of chest congestion and cough with white mucus day > night.  doe x > slow adls.stop advair Start symbicort 160 2 puffs first thing  in am and 2 puffs again in pm about 12 hours later  Work on inhaler technique:  relax and blow all the way out then take a nice smooth deep breath back in, triggering the inhaler at same time you start breathing in and hold a few secs then rinse mouth stop benazapril start benicar 40/25  one daily Use 02 sleeping and any activity other than sitting still  August 20, 2010 ov Dyspnea- much better, not limiting, no cough. Pt denies any significant sore throat, dysphagia, itching, sneezing,  nasal congestion or excess secretions,  fever, chills, sweats, unintended wt loss, pleuritic or exertional cp, hempoptysis, change in activity tolerance  orthopnea pnd or leg swelling         Current Medications (verified): 1)  Prilosec 20 Mg Cpdr (Omeprazole) .... Take  One 30-60 Min Before First Meal of The Day 2)  Ultram 50 Mg Tabs (Tramadol Hcl) .... Take 1 Tablet By Mouth Twice A Day 3)  Sulfasalazine 500 Mg  Tbec (Sulfasalazine) .... Two Am 4)  Amlodipine Besylate 10  Mg  Tabs (Amlodipine Besylate) .Marland Kitchen.. 1 Once Daily 5)  Enbrel 25 Mg/0.9ml  Soln (Etanercept) .... Weekly 6)  Tylenol Arthritis Pain 650 Mg Cr-Tabs (Acetaminophen) .... Per Bottle Directions As Needed 7)  Citalopram Hydrobromide 40 Mg  Tabs (Citalopram  Hydrobromide) .... One Daily 8)  Synthroid 175 Mcg Tabs (Levothyroxine Sodium) .... One Daily 9)  Aleve 220 Mg Tabs (Naproxen Sodium) .... 2 By Mouth  Two Times A Day 10)  Caltrate 600+d Plus 600-400 Mg-Unit Tabs (Calcium Carbonate-Vit D-Min) .Marland Kitchen.. 1 Once Daily 11)  Vitamin D 2000 Unit Tabs (Cholecalciferol) .Marland Kitchen.. 1 Once Daily 12)  Symbicort 160-4.5 Mcg/act  Aero (Budesonide-Formoterol Fumarate) .... 2 Puffs First Thing  in Am and 2 Puffs Again in Pm About 12 Hours Later 13)  Benicar Hct 40-25 Mg  Tabs (Olmesartan Medoxomil-Hctz) .... One Tablet By Mouth Daily 14)  O2  2l/min Via [place] Continuous .... Use At Bedtime  Allergies (verified): No Known Drug Allergies  Past History:  Past Medical History: Psoriatic and rheumatoid  arthritis............................Marland KitchenZimenski Hypertension      - Try off ACE July 18, 2010 >>  much better  Hypothyroidism Barrett's esophagus history of pneumococcal pneumonia and ARDS 2006 COPD     - PFT's  04/12/10 FEV1  1.21 (69%) ratio 77 and no change p B2,  DLC0 56%     - HFA 50% p coaching July 18, 2010 >> try change symbicort to as needed August 20, 2010  Chronic Respiratory Failure      - 02 dependent  since 07/02/10 >  recheck sat walking August 20, 2010 >>> 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  Pna       -  CT chest 07/03/10 Nonspecific PF mostly upper lobes history of mild vitamin D deficiency  Vital Signs:  Patient profile:   65 year old female Weight:      197 pounds O2 Sat:      93 % on Room air Temp:     97.8 degrees F oral Pulse rate:   71 / minute BP sitting:   114 / 62  (left arm) Cuff size:   large  Vitals Entered By: Vernie Murders (August 20, 2010 11:16 AM)  O2 Flow:  Room air  Serial Vital Signs/Assessments:  Comments: Ambulatory Pulse Oximetry  Resting; HR_70____    02 Sat_95%RA____  Lap1 (185 feet)   HR_111____   02 Sat__88%RA___ Lap2 (185 feet)   HR_108____   02 Sat__108RA___    Lap3 (185 feet)    HR_____   02 Sat_____  ___Test Completed without Difficulty _X__Test Stopped due to:  02 sats 83% Dr. Sherene Sires aware Renold Genta RCP, LPN  August 20, 2010 12:31 PM  By: Vernie Murders    Physical Exam  Additional Exam:  wt 200 > 192 July 18, 2010 > 197 August 21, 2010  HEENT mild turbinate edema.  Oropharynx no thrush or excess pnd or cobblestoning.  No JVD or cervical adenopathy. Mild accessory muscle hypertrophy. Trachea midline, nl thryroid. Chest was hyperinflated by percussion with diminished breath sounds and moderate increased exp time without wheeze. Hoover sign positive at mid inspiration. Regular rate and rhythm without murmur gallop or rub or increase P2 or edema.  Abd: no hsm, nl excursion. Ext warm without cyanosis or clubbing.     Impression & Recommendations:  Problem # 1:  COPD UNSPECIFIED (ICD-496) She does not have sign copd and I'm not even sure she has asthma    DDX of  difficult airways managment all start with A and  include Adherence, Ace Inhibitors, Acid Reflux, Active Sinus Disease, Alpha 1 Antitripsin deficiency, Anxiety masquerading as Airways dz,  ABPA,  allergy(esp in young), Aspiration (esp in elderly), Adverse effects of DPI,  Active smokers, plus one B  = Beta blocker use..   ACEI may in retrospect have been the problem perhaps combined with Adverse effect of DPI (advair) and perhaps acid reflux all combining to destabilze her upper airway and make her look like copd when it wasn't.  Try taper off symbicort and see if flares (stopping the advair may have been more helpful here than starting the symbicort)  Problem # 2:  HYPERTENSION (ICD-401.9)  Her updated medication list for this problem includes:    Benicar Hct 40-25 Mg Tabs (Olmesartan medoxomil-hctz) ..... One tablet by mouth daily   ACE inhibitors are problematic in  pts with airway complaints because  even experienced pulmonologists can't always distinguish ace effects from copd/asthma.   By themselves they don't actually cause a problem, much like oxygen can't by itself start a fire, but they certainly serve as a powerful catalyst or enhancer for any "fire"  or inflammatory process in the upper airway, be it caused by an ET  tube or more commonly reflux (especially in the obese or pts with known GERD or who are on biphoshonates).  In the era of ARB near equivalency until we have a better handle on the reversibility of the airway problem, it just makes sense to avoid ace entirely in the short run and then decide later, having established a level of airway control using a reasonable limited regimen, whether to add back ace but even then being  very careful to observe the pt for worsening airway control and number of meds used/ needed to control symptoms.    In her case I would avoid them completely.  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  02 rx reviewed.  Medications Added to Medication List This Visit: 1)  Aleve 220 Mg Tabs (Naproxen sodium) .... 2 by mouth  two times a day 2)  O2 2l/min Via [place] Continuous  .... Use at bedtime  Other Orders: Pulse Oximetry, Ambulatory (04540) Est. Patient Level IV (98119)  Patient Instructions: 1)  Try stopping the night time dose of symbicort first and then the am dose if doing well  2)  Remain on 02 at bedtime for now at 2lpm and just use it during the day with exertion to help lose weight 3)  your lungs are actually in pretty good shape and pulmonary follow up can be as needed Prescriptions: BENICAR HCT 40-25 MG  TABS (OLMESARTAN MEDOXOMIL-HCTZ) One tablet by mouth daily  #90 x 3   Entered and Authorized by:   Nyoka Cowden MD   Signed by:   Nyoka Cowden MD on 08/20/2010   Method used:   Print then Give to Patient   RxID:   [id number]

## 2010-12-05 NOTE — Assessment & Plan Note (Signed)
Summary: Pulmonary/ ext post hosp f/u ov with hfa teaching 50%    Visit Type:  Initial Consult Copy to:  Dr. Sung Amabile Primary Provider/Referring Provider:  Dr. Eleonore Chiquito  CC:  COPD.  History of Present Illness: 41 yowf quit smoking in May 2006 pna/ ards resumed full activity including yardwork  July 18, 2010  1st pulmonary office eval in ER era c/o doe x 6 months progressive indolent onset with copd vs pf.  Ultimately required hosp 8/30-9/1 dx copd/pf ? cause on sulfasalzine and ACD inhib better on 02 and advair but very hoarse with sense of chest congestion and cough with white mucus day > night.  doe x > slow adls.   Pt denies any significant sore throat, dysphagia, itching, sneezing,  nasal congestion or excess secretions,  fever, chills, sweats, unintended wt loss, pleuritic or exertional cp, hempoptysis,  orthopnea pnd or leg swelling Pt also denies any obvious fluctuation in symptoms with weather or environmental change or other alleviating or aggravating factors.       Current Medications (verified): 1)  Prilosec 20 Mg Cpdr (Omeprazole) .... Take 1 Capsule Once A Day 2)  Ultram 50 Mg Tabs (Tramadol Hcl) .... Take 1 Tablet By Mouth Twice A Day 3)  Sulfasalazine 500 Mg  Tbec (Sulfasalazine) .... Two Am 4)  Amlodipine Besylate 10  Mg  Tabs (Amlodipine Besylate) .Marland Kitchen.. 1 Once Daily 5)  Enbrel 25 Mg/0.59ml  Soln (Etanercept) .... Weekly 6)  Tylenol Arthritis Pain 650 Mg Cr-Tabs (Acetaminophen) .... Per Bottle Directions As Needed 7)  Benazepril-Hydrochlorothiazide 20-12.5 Mg  Tabs (Benazepril-Hydrochlorothiazide) .... 2 Every Am 8)  Citalopram Hydrobromide 40 Mg  Tabs (Citalopram Hydrobromide) .... One Daily 9)  Synthroid 175 Mcg Tabs (Levothyroxine Sodium) .... One Daily 10)  Aleve 220 Mg Tabs (Naproxen Sodium) .... 2 By Mouth Bid 11)  Caltrate 600+d Plus 600-400 Mg-Unit Tabs (Calcium Carbonate-Vit D-Min) .Marland Kitchen.. 1 Once Daily 12)  Vitamin D 2000 Unit Tabs (Cholecalciferol) .Marland Kitchen..  1 Once Daily 13)  Advair (? Strength) .Marland Kitchen.. 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 father died at age  13 of coronary artery disease.  mother died at 62 of breast cancer.   One sister, status post a gastric bypass RA- Father  Social History: Reviewed history from 07/11/2008 and no changes required. Married Children Former smoker.  Quit in May 2006.  Smoked for approx 39 yrs up to 1 1/2 ppd No ETOH Works in the [employer] at [employer]  Review of Systems       The patient complains of shortness of breath with activity and productive cough.  The patient denies shortness of breath at rest, non-productive cough, coughing up blood, chest pain, irregular heartbeats, acid heartburn, indigestion, loss of appetite, weight change, abdominal pain, difficulty swallowing, sore throat, tooth/dental  problems, headaches, nasal congestion/difficulty breathing through nose, sneezing, itching, ear ache, anxiety, depression, hand/feet swelling, joint stiffness or pain, rash, change in color of mucus, and fever.    Vital Signs:  Patient profile:   65 year old female Height:      59 inches Weight:      192 pounds BMI:     38.92 O2 Sat:      98 % on 2 L/min Temp:     98.1 degrees F oral Pulse rate:   80 / minute BP sitting:   130 / 68  (left arm) Cuff size:   large  Vitals Entered By:  Vernie Murders (July 18, 2010 11:46 AM)  O2 Flow:  2 L/min O2 Sat on room air at rest %:  91  Physical Exam  Additional Exam:  wt 200 > 192 July 18, 2010 HEENT mild turbinate edema.  Oropharynx no thrush or excess pnd or cobblestoning.  No JVD or cervical adenopathy. Mild accessory muscle hypertrophy. Trachea midline, nl thryroid. Chest was hyperinflated by percussion with diminished breath sounds and moderate increased exp time without wheeze. Hoover sign positive at mid inspiration. Regular rate and rhythm without murmur gallop or rub or increase P2 or edema.  Abd: no hsm, nl excursion. Ext warm without cyanosis or clubbing.     Impression & Recommendations:  Problem # 1:  COPD UNSPECIFIED (ICD-496) Assessment New Needs pft's repeated to see what if any trend can be established obst vs restrictive changes    DDX of  difficult airways managment all start with A and  include Adherence, Ace Inhibitors, Acid Reflux, Active Sinus Disease, Alpha 1 Antitripsin deficiency, Anxiety masquerading as Airways dz,  ABPA,  allergy(esp in young), Aspiration (esp in elderly), Adverse effects of DPI,  Active smokers, plus one B  = Beta blocker use.Marland Kitchen    ?Adherence:  I spent extra time with the patient today explaining optimal mdi  technique.  This improved from  25-50%  ? ace effect > try off  ? dpi effect > try change to symbicort 160 2 puffs first thing  in am and 2 puffs again in pm about 12 hours later   Problem # 2:  HYPERTENSION (ICD-401.9)  The following medications were removed from the medication list:    Benazepril-hydrochlorothiazide 20-12.5 Mg Tabs (Benazepril-hydrochlorothiazide) .Marland Kitchen... 2 every am Her updated medication list for this problem includes:    Benicar Hct 40-25 Mg Tabs (Olmesartan medoxomil-hctz) ..... One tablet by mouth daily   ACE inhibitors are problematic in  pts with airway complaints because  even experienced pulmonologists can't always distinguish ace effects  from copd/asthma.  By themselves they don't actually cause a problem, much like oxygen can't by itself start a fire, but they certainly serve as a powerful catalyst or enhancer for any "fire"  or inflammatory process in the upper airway, be it caused by an ET  tube or more commonly reflux (especially in the obese or pts with known GERD or who are on biphoshonates).  In the era of ARB near equivalency until we have a better handle on the reversibility of the airway problem, it just makes sense to avoid ace entirely in the short run and then decide later, having established a level of airway control using a reasonable limited regimen, whether to add back ace but even then being very careful to observe the pt for worsening airway control and number of meds used/ needed to control symptoms.  Given the complexity of her care  I would in her case avoid this class completely.  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  The other possibility is this is just the residual change from her ARDS with some other explanation for her recent exac like copd or ace effects (the latter of course does not cause new hypoxemia)  Most likely the changes are a  result of previous ARDS rather than progressive disorder like IPF but time will tell if this is right or wrong.  Problem # 4:  HYPOXEMIA (ICD-799.02) Resolved at rest, o/w needs to continue 02 continuously  Medications Added to Medication List This Visit: 1)  Prilosec 20 Mg Cpdr (Omeprazole) .... Take  one 30-60 min before first meal of the day 2)  Tylenol Arthritis Pain 650 Mg Cr-tabs (Acetaminophen)  .... Per bottle directions as needed 3)  Caltrate 600+d Plus 600-400 Mg-unit Tabs (Calcium carbonate-vit d-min) .Marland Kitchen.. 1 once daily 4)  Vitamin D 2000 Unit Tabs (Cholecalciferol) .Marland Kitchen.. 1 once daily 5)  Advair (? Strength)  .Marland Kitchen.. 1 puff two times a day 6)  Symbicort 160-4.5 Mcg/act Aero (Budesonide-formoterol fumarate) .... 2 puffs first thing  in am and 2 puffs again in pm about 12 hours later 7)  Benicar Hct 40-25 Mg Tabs (Olmesartan medoxomil-hctz) .... One tablet by mouth daily  Other Orders: Est. Patient Level IV (16109) HFA Instruction [phone])  Patient Instructions: 1)  stop advair 2)  Start symbicort 160 2 puffs first thing  in am and 2 puffs again in pm about 12 hours later  3)  Work on inhaler technique:  relax and blow all the way out then take a nice smooth deep breath back in, triggering the inhaler at same time you start breathing in and hold a few secs then rinse mouth 4)  stop benazapril 5)  start benicar 40/25  one daily 6)  Use 02 sleeping and any activity other than sitting still 7)  Please schedule a follow-up appointment in 4  weeks, sooner if needed

## 2010-12-05 NOTE — Letter (Signed)
Summary: Dr Jimmy Footman note  Dr Jimmy Footman note   Imported By: Kassie Mends 10/07/2007 11:06:22  _____________________________________________________________________  External Attachment:    Type:   Image     Comment:   Dr Jimmy Footman note

## 2010-12-05 NOTE — Progress Notes (Signed)
Summary: micardis update  Call back at Home Phone [phone]   Caller: patient vm triage Call For: k Summary of Call: Has been working with Aram Beecham on her Micardis.  Has not heard from [facility] in over a week.  What is going on  Initial call taken by: Roselle Locus,  January 25, 2008 12:31 PM  Follow-up for Phone Call        I SENT P.A DENIAL TO DR K TO SWITCH PT TO ANOTHER MED. NEED STATUS. Follow-up by: Warnell Forester,  January 25, 2008 1:33 PM  Additional Follow-up for Phone Call Additional follow up Details #1::       Additional Follow-up by: Roselle Locus,  January 26, 2008 9:14 AM    Left message at home

## 2010-12-05 NOTE — Letter (Signed)
Summary: Out of Work  Adult nurse at [employer]  [address]   [place], [place] [address]   Phone: [phone]  Fax: [phone]    July 05, 2010   Employee:  [name]    To Whom It May Concern:   For Medical reasons, please excuse the above named employee from work for the following dates:  Start:   07-02-10  End:   07-08-10  If you need additional information, please feel free to contact our office.         Sincerely,    Gordy Savers  MD

## 2010-12-05 NOTE — Assessment & Plan Note (Signed)
Medications Added PRILOSEC 20 MG CPDR (OMEPRAZOLE) Take 1 capsule once a day ULTRAM 50 MG TABS (TRAMADOL HCL) Take 1 tablet by mouth twice a day TRIAMTERENE-HCTZ 37.5-25 MG  CAPS (TRIAMTERENE-HCTZ) one qd SYNTHROID 150 MCG  TABS (LEVOTHYROXINE SODIUM) one qd EC-NAPROSYN 500 MG  TBEC (NAPROXEN) one bid SULFASALAZINE 500 MG  TBEC (SULFASALAZINE) two am one pm         History of Present Illness: 65 year old, white female seen today for follow-up of physical and two.  History of hypertension psoriatic arthritis is anticipating left total knee replacement therapy L. later this month for end-stage osteoarthritis.  History also of a hypothyroidism Barrett's esophagus.     Family History:    thought 59 of coronary artery disease.  Father died at 28 of breast cancer.  One sister, status post a gastric bypass     Physical Exam  General:     Well-developed,well-nourished,in no acute distress; alert,appropriate and cooperative throughout examination Eyes:     No corneal or conjunctival inflammation noted. EOMI. Perrla. Funduscopic exam benign, without hemorrhages, exudates or papilledema. Vision grossly normal. Ears:     External ear exam shows no significant lesions or deformities.  Otoscopic examination reveals clear canals, tympanic membranes are intact bilaterally without bulging, retraction, inflammation or discharge. Hearing is grossly normal bilaterally. Neck:     No deformities, masses, or tenderness noted. Chest Wall:     No deformities, masses, or tenderness noted. Breasts:     No mass, nodules, thickening, tenderness, bulging, retraction, inflamation, nipple discharge or skin changes noted.   Lungs:     Normal respiratory effort, chest expands symmetrically. Lungs are clear to auscultation, no crackles or wheezes. Heart:     Normal rate and regular rhythm. S1 and S2 normal without gallop, murmur, click, rub or other extra sounds. Abdomen:     obese  soft,  nontendernon-tender, no distention, and no masses.   Msk:     valgus  deformity of the left knee Pulses:     R and L carotid,radial,femoral,dorsalis pedis and posterior tibial pulses are full and equal bilaterally Extremities:     No clubbing, cyanosis, edema, or deformity noted with normal full range of motion of all joints.   Skin:     Intact without suspicious lesions or rashes Cervical Nodes:     No lymphadenopathy noted Axillary Nodes:     No palpable lymphadenopathy Inguinal Nodes:     No significant adenopathy Psych:     Cognition and judgment appear intact. Alert and cooperative with normal attention span and concentration. No apparent delusions, illusions, hallucinations    Impression & Recommendations:  Problem # 1:  HYPERTENSION (ICD-401.9)  Her updated medication list for this problem includes:    Triamterene-hctz 37.5-25 Mg Caps (Triamterene-hctz) ..... One qd will add, amlodipine 5 mg daily  Problem # 2:  PSORIATIC ARTHRITIS (ICD-696.0)  Problem # 3:  HYPOTHYROIDISM (ICD-244.9)  Her updated medication list for this problem includes:    Synthroid 150 Mcg Tabs (Levothyroxine sodium) ..... One qd   Medications Added to Medication List This Visit: 1)  Prilosec 20 Mg Cpdr (Omeprazole) .... Take 1 capsule once a day 2)  Ultram 50 Mg Tabs (Tramadol hcl) .... Take 1 tablet by mouth twice a day 3)  Triamterene-hctz 37.5-25 Mg Caps (Triamterene-hctz) .... One qd 4)  Synthroid 150 Mcg Tabs (Levothyroxine sodium) .... One qd 5)  Ec-naprosyn 500 Mg Tbec (Naproxen) .... One bid 6)  Sulfasalazine 500 Mg Tbec (Sulfasalazine) .... Two  am one pm   Patient Instructions: 1)  Please schedule a follow-up appointment in 3 months.

## 2010-12-05 NOTE — Progress Notes (Signed)
Summary: rx symbicort >ok but will need f/u ov  Phone Note Call from Patient Call back at Home Phone [phone]   Caller: Patient Call For: wert Summary of Call: pt wants 1 month rx for symbicort- [facility]. she also wants a rx (to pick up for mailorder)for 3 months supply.  Initial call taken by: Tivis Ringer, CNA,  September 30, 2010 4:33 PM  Follow-up for Phone Call        pt states she has been using the symbicoert 2 puffs inthe am but none at night per MW directions. She states uses her oxygen at night. She states she feels she cannot completely wean off symbicort. Pt is requesting an rx for symbicort 30days be sent to local pharmacy and then a 90day supply be sent to [facility]. Please advsie if ok to send rx and should we keep directions at 2 puffs twice daily? Please advise. Carron Curie CMA  September 30, 2010 4:50 PM ok but the idea was to try off and see if really needed it and if so will need re-eval here after the holidays Follow-up by: Nyoka Cowden MD,  October 01, 2010 7:59 AM  Additional Follow-up for Phone Call Additional follow up Details #1::        Pt informed that refills were sent to pharmacies.  Pt will call back to schedule appt with Dr Sherene Sires. Abigail Miyamoto RN  October 01, 2010 8:44 AM     Prescriptions: SYMBICORT 160-4.5 MCG/ACT  AERO (BUDESONIDE-FORMOTEROL FUMARATE) 2 puffs first thing  in am and 2 puffs again in pm about 12 hours later  #3 x 3   Entered by:   Abigail Miyamoto RN   Authorized by:   Nyoka Cowden MD   Signed by:   Abigail Miyamoto RN on 10/01/2010   Method used:   Faxed to ...       [facility] (mail-order)       [address]       [place], [place]  [address]       Ph: [phone]       Fax: [phone]   RxID:   [id number] SYMBICORT 160-4.5 MCG/ACT  AERO (BUDESONIDE-FORMOTEROL FUMARATE) 2 puffs first thing  in am and 2 puffs again in pm about 12 hours later  #1 x 0   Entered by:   Abigail Miyamoto RN   Authorized  by:   Nyoka Cowden MD   Signed by:   Abigail Miyamoto RN on 10/01/2010   Method used:   Electronically to        [name] (retail)       [address]       [place], [place]  [address]       Ph: [phone]       Fax: [phone]   RxID:   [id number]

## 2010-12-05 NOTE — Assessment & Plan Note (Signed)
Summary: M1A/FUP/RCD   Vital Signs:  Patient Profile:   65 Years Old Female Weight:      176 pounds Temp:     98.3 degrees F oral BP sitting:   150 / 70  (left arm)  Vitals Entered By: [name], RN (October 19, 2007 1:45 PM)                 Chief Complaint:  F/u BP.Marland Kitchen  History of Present Illness: 65 year old patient seen today for follow-up of her hypertension.  Home blood pressure readings have been under good control  Current Allergies: No known allergies       Physical Exam  General:     Well-developed,well-nourished,in no acute distress; alert,appropriate and cooperative throughout examination blood pressure 130 to 135-76 to 80    Impression & Recommendations:  Problem # 1:  HYPERTENSION (ICD-401.9) Assessment: Improved  Complete Medication List: 1)  Prilosec 20 Mg Cpdr (Omeprazole) .... Take 1 capsule once a day 2)  Ultram 50 Mg Tabs (Tramadol hcl) .... Take 1 tablet by mouth twice a day 3)  Synthroid 150 Mcg Tabs (Levothyroxine sodium) .... Not sure about dose 4)  Ec-naprosyn 500 Mg Tbec (Naproxen) .... One bid 5)  Sulfasalazine 500 Mg Tbec (Sulfasalazine) .... Two am one pm 6)  Amlodipine Besylate 10 Mg Tabs (amlodipine Besylate)  .Marland Kitchen.. 1 once daily 7)  Enbrel 25 Mg/0.52ml Soln (Etanercept) .... Weekly 8)  Tylenol 325 Mg Tabs (Acetaminophen) .... 3 tid 9)  Micardis Hct 80-25 Mg Tabs (telmisartan-hctz)  .... One daily   Patient Instructions: 1)  Please schedule a follow-up appointment in 4 months. 2)  Limit your Sodium (Salt) to less than 2 grams a day(slightly less than 1/2 a teaspoon) to prevent fluid retention, swelling, or worsening of symptoms. 3)  It is important that you exercise regularly at least 20 minutes 5 times a week. If you develop chest pain, have severe difficulty breathing, or feel very tired , stop exercising immediately and seek medical attention. 4)  You need to lose weight. Consider a lower calorie diet and regular exercise.       Prescriptions: MICARDIS HCT 80-25 MG  TABS (TELMISARTAN-HCTZ) one daily  #90 x 6   Entered and Authorized by:   Gordy Savers  MD   Signed by:   Gordy Savers  MD on 10/19/2007   Method used:   Print then Give to Patient   RxID:   [id number] AMLODIPINE BESYLATE 10  MG  TABS (AMLODIPINE BESYLATE) 1 once daily  #90 x 6   Entered and Authorized by:   Gordy Savers  MD   Signed by:   Gordy Savers  MD on 10/19/2007   Method used:   Print then Give to Patient   RxID:   [id number] EC-NAPROSYN 500 MG  TBEC (NAPROXEN) one bid  #180 x 6   Entered and Authorized by:   Gordy Savers  MD   Signed by:   Gordy Savers  MD on 10/19/2007   Method used:   Print then Give to Patient   RxID:   [id number] SYNTHROID 150 MCG  TABS (LEVOTHYROXINE SODIUM) not sure about dose  #90 x 6   Entered and Authorized by:   Gordy Savers  MD   Signed by:   Gordy Savers  MD on 10/19/2007   Method used:   Print then Give to Patient   RxID:   [id number] ULTRAM 50 MG TABS (TRAMADOL  HCL) Take 1 tablet by mouth twice a day  #180 x 6   Entered and Authorized by:   Gordy Savers  MD   Signed by:   Gordy Savers  MD on 10/19/2007   Method used:   Print then Give to Patient   RxID:   [id number] PRILOSEC 20 MG CPDR (OMEPRAZOLE) Take 1 capsule once a day  #90 x 6   Entered and Authorized by:   Gordy Savers  MD   Signed by:   Gordy Savers  MD on 10/19/2007   Method used:   Print then Give to Patient   RxID:   [id number]  ]

## 2010-12-05 NOTE — Consult Note (Signed)
Summary: Dr Yolanda Bonine note  Dr Yolanda Bonine note   Imported By: Kassie Mends 02/17/2008 16:02:11  _____________________________________________________________________  External Attachment:    Type:   Image     Comment:   Dr Yolanda Bonine note

## 2010-12-06 NOTE — Procedures (Signed)
Summary: flex sigmoidoscopy   flex sigmoidoscopy   Imported By: Kassie Mends 02/17/2008 16:35:16  _____________________________________________________________________  External Attachment:    Type:   Image     Comment:   flex sigmoidoscopy

## 2010-12-06 NOTE — Letter (Signed)
Summary: d/c summary  d/c summary   Imported By: Kassie Mends 02/17/2008 16:36:48  _____________________________________________________________________  External Attachment:    Type:   Image     Comment:   d/c summary

## 2010-12-06 NOTE — Letter (Signed)
Summary: Dr Maple Hudson note  Dr Maple Hudson note   Imported By: Kassie Mends 02/17/2008 16:04:00  _____________________________________________________________________  External Attachment:    Type:   Image     Comment:   Dr Maple Hudson note

## 2010-12-06 NOTE — Letter (Signed)
Summary: [facility] note  [facility] note   Imported By: Kassie Mends 07/20/2007 11:10:03  _____________________________________________________________________  External Attachment:    Type:   Image     Comment:   [facility] note

## 2010-12-10 ENCOUNTER — Encounter: Payer: Self-pay | Admitting: Internal Medicine

## 2010-12-11 NOTE — Assessment & Plan Note (Signed)
Summary: seen in er/per dayna hx copd/poss chf/mt   Vital Signs:  Patient profile:   65 year old female Height:      59 inches Weight:      204 pounds BMI:     41.35 Pulse rate:   67 / minute BP sitting:   130 / 70  (left arm)  Vitals Entered By: Laurance Flatten CMA (December 04, 2010 12:48 PM)  Referring Provider:  Dr. Sung Amabile Primary Provider:  Dr. Eleonore Chiquito   History of Present Illness: [name] is referred today by Dr. Maggie Schwalbe for evaluation of sob with likely diastolic CHF.  The patient has a long standing h/o lung disease. She had pneumonia complicated by ARDS 5 yrs ago and has required home oxygen with varying prescriptions.  She has been out of her blood pressure medication for over a week.  The patient noted increasing sob, peripheral edema and PND. She went to the ER yesterday and was found to have periheral edema and plueral effusions by CXR. She was treated with IV lasix and has had a nice diuresis.  Her edema and dyspnea are improved.  No syncope or chest pain. No other complaints. She would like to get back to work but has been using oxygen only sparingly at home.  Current Medications (verified): 1)  Prilosec 20 Mg Cpdr (Omeprazole) .... Take  One 30-60 Min Before First Meal of The Day 2)  Ultram 50 Mg Tabs (Tramadol Hcl) .... Take 1 Tablet By Mouth Twice A Day 3)  Sulfasalazine 500 Mg  Tbec (Sulfasalazine) .... Two Am 4)  Amlodipine Besylate 10  Mg  Tabs (Amlodipine Besylate) .Marland Kitchen.. 1 Once Daily 5)  Enbrel 25 Mg/0.10ml  Soln (Etanercept) .... Weekly 6)  Tylenol Arthritis Pain 650 Mg Cr-Tabs (Acetaminophen) .... Per Bottle Directions As Needed 7)  Citalopram Hydrobromide 40 Mg  Tabs (Citalopram Hydrobromide) .... One Daily 8)  Synthroid 175 Mcg Tabs (Levothyroxine Sodium) .... One Daily 9)  Aleve 220 Mg Tabs (Naproxen Sodium) .... 2 By Mouth  Two Times A Day 10)  Caltrate 600+d Plus 600-400 Mg-Unit Tabs (Calcium Carbonate-Vit D-Min) .Marland Kitchen.. 1 Once Daily 11)  Vitamin  D 2000 Unit Tabs (Cholecalciferol) .Marland Kitchen.. 1 Once Daily 12)  Symbicort 160-4.5 Mcg/act  Aero (Budesonide-Formoterol Fumarate) .... 2 Puffs First Thing  in Am and 2 Puffs Again in Pm About 12 Hours Later 13)  Losartan Potassium-Hctz 100-25 Mg Tabs (Losartan Potassium-Hctz) .... Once Daily 14)  O2  2l/min Via [place] Continuous .... Use At Bedtime  Allergies (verified): No Known Drug Allergies  Past History:  Past Medical History: Last updated: 08/20/2010 Psoriatic and rheumatoid  arthritis............................Marland KitchenZimenski Hypertension      - Try off ACE July 18, 2010 >>  much better  Hypothyroidism Barrett's esophagus history of pneumococcal pneumonia and ARDS 2006 COPD     - PFT's  04/12/10 FEV1  1.21 (69%) ratio 77 and no change p B2,  DLC0 56%     - HFA 50% p coaching July 18, 2010 >> try change symbicort to as needed August 20, 2010  Chronic Respiratory Failure      - 02 dependent  since 07/02/10 >  recheck sat walking August 20, 2010 >>> 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  Normal.  Lung findings  Chest CT April 2011- bilateral pulmonary infiltrates; coronary artery calcification  Family History: Last updated: 07-31-10 father died at age  68 of coronary artery disease.  mother died at 16 of breast cancer.   One sister, status post a gastric bypass RA- Father  Social History: Last updated: 2010/07/31 Married Children Former smoker.  Quit in May  2006.  Smoked for approx 39 yrs up to 1 1/2 ppd No ETOH Works in the [employer] at [employer]  Review of Systems       All systems reviewed and negative except as noted in the HPI and joint aches.  Physical Exam  General:  overweight-appearing.  blood pressure 110/70overweight-appearing.   Head:  Normocephalic and atraumatic without obvious abnormalities. No apparent alopecia or balding. Eyes:  No corneal or conjunctival inflammation noted. EOMI. Perrla. Funduscopic exam benign, without hemorrhages, exudates or papilledema. Vision grossly normal. Mouth:  Oral mucosa and oropharynx without lesions or exudates.  Teeth in good repair. Neck:  No deformities, masses, or tenderness noted. Lungs:  clear bilaterally except for minimal rales in the bases. No wheezes. Heart:  Normal rate and regular rhythm. S1 and S2 normal without gallop, murmur, click, rub or other extra sounds. Abdomen:  Bowel sounds positive,abdomen soft and non-tender without masses, organomegaly or hernias noted.. Obese. Msk:  No deformity or scoliosis noted of thoracic or lumbar spine.   Pulses:  R and L carotid,radial,femoral,dorsalis pedis and posterior tibial pulses are full and equal bilaterally Extremities:  no clubbing, or edema Neurologic:  cranial nerves II-XII intact, sensation intact to pinprick, and gait normal.  cranial nerves II-XII intact, sensation intact to pinprick, and gait normal.     Impression & Recommendations:  Problem # 1:  ACUTE ON CHRONIC DIASTOLIC HEART FAILURE (ICD-428.33) Her symptoms have improved after IV lasix in the ED yesterday. As she was off of her medications for over a week, I have recommended that she start back her losartenHCTZ and watch the weight.  If her weight and peripheral edema remain controlled, then she should hold off on additional lasix. If her swelling and dyspnea return, then I have asked her to take 40 mg of lasix daily with potassium  supplementation. Her updated medication list for this problem includes:    Losartan Potassium-hctz 100-25 Mg Tabs (Losartan potassium-hctz) ..... Once daily  Problem # 2:  HYPOXEMIA (ICD-799.02) She has asked about an oxygen concentrator today. will prescribe.  Problem # 3:  HYPERTENSION (ICD-401.9) Her pressure is controlled today. I have asked her to maintain a low sodium diet. Her updated medication list for this problem includes:    Losartan Potassium-hctz 100-25 Mg Tabs (Losartan potassium-hctz) ..... Once daily  Patient Instructions: 1)  Your physician wants you to follow-up in:  3 months with Dr Court Joy will receive a reminder letter in the mail two months in advance. If you don't receive a letter, please call our office to schedule the follow-up appointment. 2)  Your physician recommends that you continue on your current medications as directed. Please refer to the Current Medication list given to you today. 3)  needs small O2 concentrator

## 2010-12-11 NOTE — Medication Information (Signed)
Summary: Clarification for Losartan/[employer]  Clarification for Losartan/[employer]   Imported By: Sherian Rein 12/06/2010 12:19:40  _____________________________________________________________________  External Attachment:    Type:   Image     Comment:   External Document

## 2010-12-11 NOTE — Progress Notes (Signed)
  Phone Note Call from Patient   Caller: Patient Call For: Gordy Savers  MD Summary of Call: Pt.'s daughter called and stated that her Mom was on the way to the office to see Dr. Kirtland Bouchard and had SOB and went back home.  Off O2, her saturations are 70s and she seems to be running a fever with the SOB.  Advised to call EMS and got to the ER ASAP. Initial call taken by: [facility] CMA AAMA,  December 03, 2010 10:03 AM  Follow-up for Phone Call        noted Follow-up by: Gordy Savers  MD,  December 03, 2010 10:23 AM

## 2010-12-11 NOTE — Progress Notes (Signed)
Summary: dayna pa wants pt to follow up with dod today  Phone Note Call from Patient   Reason for Call: Talk to Nurse Summary of Call: dayna left after hours that this pt needed to be followed up today, she will be a new pt, she has hx of copd and poss chf- Initial call taken by: Glynda Jaeger,  December 04, 2010 8:06 AM  Follow-up for Phone Call        sill see the pt at 12:00 Dennis Bast, RN, BSN  December 04, 2010 8:59 AM     Appended Document: dayna pa wants pt to follow up with dod today will see pt at 12noon today per Dr Ladona Ridgel

## 2010-12-11 NOTE — Assessment & Plan Note (Signed)
Summary: Pulmonary/ ext ov with sat ra ret 83 and walking 2lpm= ok   Vital Signs:  Patient profile:   65 year old female Weight:      207.38 pounds O2 Sat:      95 % on 2 L/min Temp:     98.4 degrees F oral Pulse rate:   75 / minute BP sitting:   124 / 64  (left arm) Cuff size:   large  Vitals Entered By: Vernie Murders (December 05, 2010 9:19 AM)  O2 Flow:  2 L/min O2 Sat on room air at rest %:  83 CC: Increased SOB x 5 days   Serial Vital Signs/Assessments:  Comments: 9:53 AM Ambulatory Pulse Oximetry  Resting; HR__75___    02 Sat__95%2lpm___  Lap1 (185 feet)   HR_100____   02 Sat__90%2lpm___ Lap2 (185 feet)   HR__116___   02 Sat__90%2lpm___    Lap3 (185 feet)   HR_____   02 Sat_____  ___Test Completed without Difficulty _x__Test Stopped due to: pt was SOB   By: Vernie Murders    Copy to:  Dr. Sung Amabile Primary Provider/Referring Provider:  Dr. Eleonore Chiquito  CC:  Increased SOB x 5 days.  History of Present Illness: 89 yowf quit smoking in May 2006 pna/ ards resumed full activity including yardwork at wt 140 and pft's c/w restrictive changes 04/2010   July 18, 2010  1st pulmonary office eval in ER era c/o doe x 6 months progressive indolent onset with copd vs pf.  Ultimately required hosp 8/30-9/1 dx copd/pf ? cause on sulfasalzine and ACE inhib better on 02 and advair but very hoarse with sense of chest congestion and cough with white mucus day > night.  doe x > slow adls.stop advair Start symbicort 160 2 puffs first thing  in am and 2 puffs again in pm about 12 hours later  Work on inhaler technique:  relax and blow all the way out then take a nice smooth deep breath back in, triggering the inhaler at same time you start breathing in and hold a few secs then rinse mouth stop benazapril start benicar 40/25  one daily Use 02 sleeping and any activity other than sitting still  August 20, 2010 ov Dyspnea- much better, not limiting, no cough.Try stopping  the night time dose of symbicort first and then the am dose if doing well  Remain on 02 at bedtime for now at 2lpm and just use it during the day with exertion to help lose weight your lungs are actually in pretty good shape and pulmonary follow up can be as needed > did not follow recs for using 02 with ex to help loose wt  December 05, 2010 ov p exac of chf 1/31 seen in er  with HBP and bnp 222  and now 02 dep 24 hours per day,  no cough, only walking maybe 100 ft.  baseline = sob RA same distance she now struggles to do on 02 but definitely better since back on 02 and hbp rx.  Pt denies any significant sore throat, dysphagia, itching, sneezing,  nasal congestion or excess secretions,  fever, chills, sweats, unintended wt loss, pleuritic or exertional cp, hempoptysis, change in activity tolerance  orthopnea pnd or increase in leg swelling.       Current Medications (verified): 1)  Prilosec 20 Mg Cpdr (Omeprazole) .... Take  One 30-60 Min Before First Meal of The Day 2)  Ultram 50 Mg Tabs (Tramadol Hcl) .... Take 1  Tablet By Mouth Twice A Day 3)  Sulfasalazine 500 Mg  Tbec (Sulfasalazine) .... Two Am 4)  Amlodipine Besylate 10  Mg  Tabs (Amlodipine Besylate) .Marland Kitchen.. 1 Once Daily 5)  Enbrel 25 Mg/0.9ml  Soln (Etanercept) .... Weekly 6)  Tylenol Arthritis Pain 650 Mg Cr-Tabs (Acetaminophen) .... Per Bottle Directions As Needed 7)  Citalopram Hydrobromide 40 Mg  Tabs (Citalopram Hydrobromide) .... One Daily 8)  Synthroid 175 Mcg Tabs (Levothyroxine Sodium) .... One Daily 9)  Aleve 220 Mg Tabs (Naproxen Sodium) .... 2 By Mouth  Two Times A Day 10)  Caltrate 600+d Plus 600-400 Mg-Unit Tabs (Calcium Carbonate-Vit D-Min) .Marland Kitchen.. 1 Once Daily 11)  Vitamin D 2000 Unit Tabs (Cholecalciferol) .Marland Kitchen.. 1 Once Daily 12)  Symbicort 160-4.5 Mcg/act  Aero (Budesonide-Formoterol Fumarate) .... 2 Puffs First Thing  in Am and 2 Puffs Again in Pm About 12 Hours Later 13)  Losartan Potassium-Hctz 100-25 Mg Tabs (Losartan  Potassium-Hctz) .... Once Daily 14)  O2  2l/min Via [place] Continuous .... 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  Oropharynx no thrush or excess pnd or cobblestoning.  No JVD or cervical adenopathy. Mild accessory muscle hypertrophy. Trachea midline, nl thryroid.  Trace crackles on inps R > L base.Marland Kitchen Hoover sign positive at mid inspiration. Regular rate and rhythm without murmur gallop or rub or increase P2 or edema.  Abd: no hsm, nl excursion. Ext warm without cyanosis or clubbing.     Impression & Recommendations:  Problem # 1:  HYPOXEMIA (ICD-799.02)  Acutely worse in setting of component of diastolic chf  I had an extended discussion with the patient today lasting 15 to 20 minutes of a 25 minute visit on the following issues:   Previous w/u and recs reviewed and need to comply with 02 recs as written and try to keep wt down  I spent extra time with the patient today explaining optimal mdi  technique.  This improved from  50-75% and she feels symbicort helping despite absence of airflow obstruction  so ok to continue  Orders: Est. 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  The other possibility is PF s/p ALI here.  Needs serial f/u to be sure this is not a progressive pattern and that rx for diastolic dysfunction controls her symptoms overlap with PF    Medications Added to Medication List This Visit: 1)  O2 2l/min Via [place] Continuous  .... Use at bedtime and during the day as needed  Other Orders: Misc. Referral (Misc. Ref)  Patient Instructions: 1)  Remain on 02  2lpm 24 hours per day but increase to 3lpm with exertion 2)  Work on inhaler technique:  relax and blow all the way out then take a nice smooth deep breath back in, triggering the inhaler at same time you start breathing in  3)  Work with Christoper Allegra to meet your needs and call me with the name of your [employer] rep if not satisfied with your equipment if they can't help you first 4)  Please schedule a follow-up appointment in 6 weeks, sooner if needed and cxr    Orders Added: 1)  Misc. Referral [Misc. Ref] 2)  Est. Patient Level IV [16109] 3)  Pulse Oximetry, Ambulatory [60454]

## 2010-12-11 NOTE — Medication Information (Signed)
Summary: Formulary/[employer]  Formulary/[employer]   Imported By: Lester Bloomfield 12/04/2010 11:21:29  _____________________________________________________________________  External Attachment:    Type:   Image     Comment:   External Document

## 2010-12-11 NOTE — Medication Information (Signed)
Summary: Losartan / [facility]  Losartan / [facility]   Imported By: Lennie Odor 12/02/2010 14:21:56  _____________________________________________________________________  External Attachment:    Type:   Image     Comment:   External Document

## 2010-12-31 NOTE — Procedures (Signed)
Summary: Oximetry/[employer]  Oximetry/[facility]   Imported By: Sherian Rein 12/23/2010 08:37:37  _____________________________________________________________________  External Attachment:    Type:   Image     Comment:   External Document

## 2011-01-09 ENCOUNTER — Encounter: Payer: Self-pay | Admitting: Internal Medicine

## 2011-01-10 ENCOUNTER — Telehealth: Payer: Self-pay | Admitting: Internal Medicine

## 2011-01-13 ENCOUNTER — Telehealth: Payer: Self-pay | Admitting: Internal Medicine

## 2011-01-17 LAB — BASIC METABOLIC PANEL
BUN: 26 mg/dL — ABNORMAL HIGH (ref 6–23)
CO2: 29 mEq/L (ref 19–32)
Calcium: 8.9 mg/dL (ref 8.4–10.5)
Chloride: 103 mEq/L (ref 96–112)
Creatinine, Ser: 0.96 mg/dL (ref 0.4–1.2)
GFR calc Af Amer: >60 mL/min (ref >60)
GFR calc non Af Amer: 59 mL/min — ABNORMAL LOW (ref >60)
Glucose, Bld: 171 mg/dL — ABNORMAL HIGH (ref 70–99)
Potassium: 5.1 mEq/L (ref 3.5–5.1)
Sodium: 136 mEq/L (ref 135–145)

## 2011-01-17 LAB — CBC
HCT: 34 % — ABNORMAL LOW (ref 36.0–46.0)
HCT: 36.8 % (ref 36.0–46.0)
Hemoglobin: 10.1 g/dL — ABNORMAL LOW (ref 12.0–15.0)
Hemoglobin: 9.4 g/dL — ABNORMAL LOW (ref 12.0–15.0)
MCH: 22.9 pg — ABNORMAL LOW (ref 26.0–34.0)
MCH: 23 pg — ABNORMAL LOW (ref 26.0–34.0)
MCHC: 27.4 g/dL — ABNORMAL LOW (ref 30.0–36.0)
MCHC: 27.6 g/dL — ABNORMAL LOW (ref 30.0–36.0)
MCV: 83.1 fL (ref 78.0–100.0)
MCV: 83.4 fL (ref 78.0–100.0)
Platelets: 212 10*3/uL (ref 150–400)
Platelets: 250 10*3/uL (ref 150–400)
RBC: 4.09 MIL/uL (ref 3.87–5.11)
RBC: 4.41 MIL/uL (ref 3.87–5.11)
RDW: 15.9 % — ABNORMAL HIGH (ref 11.5–15.5)
RDW: 16 % — ABNORMAL HIGH (ref 11.5–15.5)
WBC: 6.2 10*3/uL (ref 4.0–10.5)
WBC: 7.4 10*3/uL (ref 4.0–10.5)

## 2011-01-17 LAB — COMPREHENSIVE METABOLIC PANEL
ALT: 19 U/L (ref 0–35)
AST: 29 U/L (ref 0–37)
Albumin: 3.8 g/dL (ref 3.5–5.2)
Alkaline Phosphatase: 47 U/L (ref 39–117)
BUN: 28 mg/dL — ABNORMAL HIGH (ref 6–23)
CO2: 24 mEq/L (ref 19–32)
Calcium: 9 mg/dL (ref 8.4–10.5)
Chloride: 107 mEq/L (ref 96–112)
Creatinine, Ser: 1.02 mg/dL (ref 0.4–1.2)
GFR calc Af Amer: >60 mL/min (ref >60)
GFR calc non Af Amer: 55 mL/min — ABNORMAL LOW (ref >60)
Glucose, Bld: 97 mg/dL (ref 70–99)
Potassium: 4.6 mEq/L (ref 3.5–5.1)
Sodium: 141 mEq/L (ref 135–145)
Total Bilirubin: 0.3 mg/dL (ref 0.3–1.2)
Total Protein: 6.8 g/dL (ref 6.0–8.3)

## 2011-01-17 LAB — LIPID PANEL
Cholesterol: 146 mg/dL (ref 0–200)
HDL: 40 mg/dL (ref >39)
LDL Cholesterol: 98 mg/dL (ref 0–99)
Total CHOL/HDL Ratio: 3.7 RATIO
Triglycerides: 39 mg/dL (ref <150)
VLDL: 8 mg/dL (ref 0–40)

## 2011-01-17 LAB — DIFFERENTIAL
Basophils Absolute: 0 10*3/uL (ref 0.0–0.1)
Basophils Absolute: 0.1 10*3/uL (ref 0.0–0.1)
Basophils Relative: 1 % (ref 0–1)
Basophils Relative: 1 % (ref 0–1)
Eosinophils Absolute: 0 10*3/uL (ref 0.0–0.7)
Eosinophils Absolute: 0.4 10*3/uL (ref 0.0–0.7)
Eosinophils Relative: 0 % (ref 0–5)
Eosinophils Relative: 6 % — ABNORMAL HIGH (ref 0–5)
Lymphocytes Relative: 29 % (ref 12–46)
Lymphocytes Relative: 8 % — ABNORMAL LOW (ref 12–46)
Lymphs Abs: 0.5 10*3/uL — ABNORMAL LOW (ref 0.7–4.0)
Lymphs Abs: 2.2 10*3/uL (ref 0.7–4.0)
Monocytes Absolute: 0 10*3/uL — ABNORMAL LOW (ref 0.1–1.0)
Monocytes Absolute: 0.9 10*3/uL (ref 0.1–1.0)
Monocytes Relative: 1 % — ABNORMAL LOW (ref 3–12)
Monocytes Relative: 12 % (ref 3–12)
Neutro Abs: 3.8 10*3/uL (ref 1.7–7.7)
Neutro Abs: 5.7 10*3/uL (ref 1.7–7.7)
Neutrophils Relative %: 52 % (ref 43–77)
Neutrophils Relative %: 91 % — ABNORMAL HIGH (ref 43–77)

## 2011-01-17 LAB — CARDIAC PANEL(CRET KIN+CKTOT+MB+TROPI)
CK, MB: 2.7 ng/mL (ref 0.3–4.0)
CK, MB: 2.7 ng/mL (ref 0.3–4.0)
CK, MB: 2.9 ng/mL (ref 0.3–4.0)
Relative Index: 2.3 (ref 0.0–2.5)
Relative Index: 2.4 (ref 0.0–2.5)
Relative Index: 2.6 — ABNORMAL HIGH (ref 0.0–2.5)
Total CK: 103 U/L (ref 7–177)
Total CK: 113 U/L (ref 7–177)
Total CK: 125 U/L (ref 7–177)
Troponin I: 0.01 ng/mL (ref 0.00–0.06)
Troponin I: 0.01 ng/mL (ref 0.00–0.06)
Troponin I: 0.01 ng/mL (ref 0.00–0.06)

## 2011-01-17 LAB — BRAIN NATRIURETIC PEPTIDE: Pro B Natriuretic peptide (BNP): 45 pg/mL (ref 0.0–100.0)

## 2011-01-17 LAB — BLOOD GAS, ARTERIAL
Acid-Base Excess: 5.6 mmol/L — ABNORMAL HIGH (ref 0.0–2.0)
Bicarbonate: 30.7 mEq/L — ABNORMAL HIGH (ref 20.0–24.0)
Drawn by: 33247
O2 Content: 2 L/min
O2 Saturation: 93.2 %
Patient temperature: 99.4
TCO2: 32.4 mmol/L (ref 0–100)
pCO2 arterial: 56.5 mmHg — ABNORMAL HIGH (ref 35.0–45.0)
pH, Arterial: 7.357 (ref 7.350–7.400)
pO2, Arterial: 75.3 mmHg — ABNORMAL LOW (ref 80.0–100.0)

## 2011-01-17 LAB — TSH: TSH: 4.42 u[IU]/mL (ref 0.350–4.500)

## 2011-01-17 LAB — PROTIME-INR
INR: 0.98 (ref 0.00–1.49)
Prothrombin Time: 13.2 seconds (ref 11.6–15.2)

## 2011-01-17 LAB — ANTI-NEUTROPHIL ANTIBODY

## 2011-01-17 LAB — MPO/PR-3 (ANCA) ANTIBODIES

## 2011-01-17 LAB — ANA: Anti Nuclear Antibody(ANA): NEGATIVE

## 2011-01-17 LAB — APTT: aPTT: 30 seconds (ref 24–37)

## 2011-01-17 LAB — ANGIOTENSIN CONVERTING ENZYME: Angiotensin-Converting Enzyme: 15 U/L (ref 8–52)

## 2011-01-21 ENCOUNTER — Ambulatory Visit (INDEPENDENT_AMBULATORY_CARE_PROVIDER_SITE_OTHER)
Admission: RE | Admit: 2011-01-21 | Discharge: 2011-01-21 | Disposition: A | Discharge status: Home / Self Care | Payer: Managed Care, Other (non HMO) | Source: Ambulatory Visit | Attending: Internal Medicine | Admitting: Internal Medicine

## 2011-01-21 ENCOUNTER — Other Ambulatory Visit: Payer: Self-pay

## 2011-01-21 ENCOUNTER — Encounter: Payer: Self-pay | Admitting: Internal Medicine

## 2011-01-21 ENCOUNTER — Ambulatory Visit (INDEPENDENT_AMBULATORY_CARE_PROVIDER_SITE_OTHER): Payer: Managed Care, Other (non HMO) | Admitting: Internal Medicine

## 2011-01-21 VITALS — BP 134/70 | HR 70 | Temp 98.1°F | Wt 183.4 lb

## 2011-01-21 DIAGNOSIS — J8410 Pulmonary fibrosis, unspecified: Secondary | ICD-10-CM

## 2011-01-21 DIAGNOSIS — R0902 Hypoxemia: Secondary | ICD-10-CM

## 2011-01-21 DIAGNOSIS — J449 Chronic obstructive pulmonary disease, unspecified: Secondary | ICD-10-CM

## 2011-01-21 NOTE — Assessment & Plan Note (Signed)
The proper method of use, as well as anticipated side effects, of this metered-dose inhaler are discussed and demonstrated to the patient.  Improved to 75% p coaching

## 2011-01-21 NOTE — Assessment & Plan Note (Signed)
02 needs reviewed.  See instructions

## 2011-01-21 NOTE — Patient Instructions (Addendum)
Work on inhaler technique:  relax and gently blow all the way out then take a nice smooth deep breath back in, triggering the inhaler at same time you start breathing in.  Hold for up to 5 seconds if you can.  Rinse and gargle with water when done   If your mouth or throat starts to bother you,   I suggest you time the inhaler to your dental care and after using the inhaler(s) brush teeth and tongue with a baking soda containing toothpaste and when you rinse this out, gargle with it first to see if this helps your mouth and throat.     Ok to use 02 only if exerting during the day but always at night @ 2lpm

## 2011-01-21 NOTE — Progress Notes (Signed)
Subjective:    Patient ID: [name], female    DOB: 13-Jul-1946, 65 y.o.   MRN: [id number]  HPI 67 yowf quit smoking in May 2006 pna/ ards resumed full activity including yardwork at wt 140 and pft's c/w restrictive changes 04/2010   July 18, 2010  1st pulmonary office eval in ER era c/o doe x 6 months progressive indolent onset with copd vs pf.  Ultimately required hosp 8/30-9/1 dx copd/pf ? cause on sulfasalzine and ACE inhib better on 02 and advair but very hoarse with sense of chest congestion and cough with white mucus day > night.  doe x > slow adls.stop advair Start symbicort 160 2 puffs first thing  in am and 2 puffs again in pm about 12 hours later  Work on inhaler technique:  relax and blow all the way out then take a nice smooth deep breath back in, triggering the inhaler at same time you start breathing in and hold a few secs then rinse mouth stop benazapril start benicar 40/25  one daily Use 02 sleeping and any activity other than sitting still  August 20, 2010 ov Dyspnea- much better, not limiting, no cough.Try stopping the night time dose of symbicort first and then the am dose if doing well  Remain on 02 at bedtime for now at 2lpm and just use it during the day with exertion to help lose weight your lungs are actually in pretty good shape and pulmonary follow up can be as needed > did not follow recs for using 02 with ex to help loose wt  December 05, 2010 ov p exac of chf 1/31 seen in er  with HBP and bnp 222  and now 02 dep 24 hours per day,  no cough, only walking maybe 100 ft.  baseline = sob RA same distance she now struggles to do on 02 but definitely better since back on 02 and hbp rx. Rec no change rx  01/21/2011 ov cc cough 3 x a day minimal mucoid sputum, not taking symbicort even  one twice daily and no noct cough.  DOE better with intentional wt loss.   Pt denies any significant sore throat, dysphagia, itching, sneezing,  nasal congestion or excess/ purulent  secretions,  fever, chills, sweats, unintended wt loss, pleuritic or exertional cp, hempoptysis, orthopnea pnd or leg swelling.    Also denies any obvious fluctuation of symptoms with weather or environmental changes or other aggravating or alleviating factors.    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  Oropharynx no thrush or excess pnd or cobblestoning.  No JVD or cervical adenopathy. Mild accessory muscle hypertrophy. Trachea midline, nl thryroid.  Trace crackles on inps R > L base.Marland Kitchen Hoover sign positive at mid inspiration. Regular rate and rhythm without murmur gallop or rub or increase P2 or edema.  Abd: no hsm, nl excursion. Ext warm without cyanosis or clubbing.    Assessment & Plan:

## 2011-01-21 NOTE — Progress Notes (Signed)
Summary: discuss meds- heart failure  Phone Note From Other Clinic   Caller: rachel hudson rn- [phone].  Request: Talk with Nurse Summary of Call: discuss heart failure- meds.  Initial call taken by: Lorne Skeens,  January 10, 2011 9:16 AM  Follow-up for Phone Call        spoke with Dondra Spry at the answering service I called before 12 and was put on hold.  hung up and tried to call back got the service.  They tried the back line and got no one.  She is going to leave a message with Fleet Contras to call me back regarding pt Amber Bast, RN, BSN  January 10, 2011 2:42 PM

## 2011-01-21 NOTE — Progress Notes (Addendum)
Summary: dr Durwin Reges office rtn call/lm  Phone Note From Other Clinic   Caller: Lanora Manis [phone] szam[place]ky Summary of Call: rtn kelly's call Initial call taken by: Glynda Jaeger,  January 13, 2011 9:36 AM  Follow-up for Phone Call        called back and Lanora Manis stasted that Fleet Contras was at lunch Dennis Bast, RN, BSN  January 13, 2011 12:58 PM needs to call Dr Janese Banks on Wed Dennis Bast, RN, BSN  January 14, 2011 10:37 AM

## 2011-01-21 NOTE — Assessment & Plan Note (Signed)
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     

## 2011-01-22 ENCOUNTER — Encounter: Payer: Self-pay | Admitting: Internal Medicine

## 2011-01-22 ENCOUNTER — Telehealth: Payer: Self-pay | Admitting: Internal Medicine

## 2011-01-23 NOTE — Telephone Encounter (Signed)
Dr Ladona Ridgel to call Dr Docia Chuck today

## 2011-01-28 ENCOUNTER — Encounter: Payer: Self-pay | Admitting: Internal Medicine

## 2011-01-31 ENCOUNTER — Encounter: Payer: Self-pay | Admitting: Internal Medicine

## 2011-02-04 ENCOUNTER — Encounter: Payer: Self-pay | Admitting: Internal Medicine

## 2011-02-11 ENCOUNTER — Other Ambulatory Visit: Payer: Self-pay | Admitting: Radiology

## 2011-02-11 ENCOUNTER — Ambulatory Visit: Payer: Managed Care, Other (non HMO) | Admitting: Internal Medicine

## 2011-02-17 ENCOUNTER — Encounter: Payer: Self-pay | Admitting: Internal Medicine

## 2011-02-18 ENCOUNTER — Encounter: Payer: Self-pay | Admitting: Internal Medicine

## 2011-02-25 ENCOUNTER — Ambulatory Visit (INDEPENDENT_AMBULATORY_CARE_PROVIDER_SITE_OTHER): Payer: Managed Care, Other (non HMO) | Admitting: Internal Medicine

## 2011-02-25 ENCOUNTER — Ambulatory Visit: Payer: Managed Care, Other (non HMO) | Admitting: Internal Medicine

## 2011-02-25 ENCOUNTER — Encounter: Payer: Self-pay | Admitting: Internal Medicine

## 2011-02-25 DIAGNOSIS — J45909 Unspecified asthma, uncomplicated: Secondary | ICD-10-CM

## 2011-02-25 DIAGNOSIS — J8410 Pulmonary fibrosis, unspecified: Secondary | ICD-10-CM

## 2011-02-25 DIAGNOSIS — R0902 Hypoxemia: Secondary | ICD-10-CM

## 2011-02-25 NOTE — Patient Instructions (Addendum)
Your technique is so good it's ok to take symbicort one twice daily with option of twice daily for any difficulty with breathing must use 2 every 12 hours   If you are satisfied with your treatment plan let your doctor know and he/she can either refill your medications or you can return here when your prescription runs out.     If in any way you are not 100% satisfied,  please tell us.  If 100% better, tell your friends!

## 2011-02-25 NOTE — Progress Notes (Signed)
Subjective:    Patient ID: [name], female    DOB: 1945/11/20, 65 y.o.   MRN: [id number]  HPI   Subjective:    Patient ID: [name], female    DOB: 07-25-1946, 65 y.o.   MRN: [id number]  HPI 41 yowf quit smoking in May 2006 pna/ ards resumed full activity including yardwork at wt 140 and pft's c/w restrictive changes 04/2010   July 18, 2010  1st pulmonary office eval in ER era c/o doe x 6 months progressive indolent onset with copd vs pf.  Ultimately required hosp 8/30-9/1 dx copd/pf ? cause on sulfasalzine and ACE inhib better on 02 and advair but very hoarse with sense of chest congestion and cough with white mucus day > night.  doe x > slow adls.stop advair Start symbicort 160 2 puffs first thing  in am and 2 puffs again in pm about 12 hours later  Work on inhaler technique:   stop benazapril start benicar 40/25  one daily Use 02 sleeping and any activity other than sitting still  August 20, 2010 ov Dyspnea- much better, not limiting, no cough.Try stopping the night time dose of symbicort first and then the am dose if doing well  Remain on 02 at bedtime for now at 2lpm and just use it during the day with exertion to help lose weight your lungs are actually in pretty good shape and pulmonary follow up can be as needed > did not follow recs for using 02 with ex to help loose wt  December 05, 2010 ov p exac of chf 1/31 seen in er  with HBP and bnp 222  and now 02 dep 24 hours per day,  no cough, only walking maybe 100 ft.  baseline = sob RA same distance she now struggles to do on 02 but definitely better since back on 02 and hbp rx. Rec no change rx  01/21/2011 ov cc cough 3 x a day minimal mucoid sputum, not taking symbicort even  one twice daily and no noct cough.  DOE better with intentional wt loss.  rec work on [employer] and only use 02 hs and daytime as needed  02/25/2011 ov/Wert cc sob better with intentional wt loss to point where often not using 02 with activity  and only using symbicort one bid  - no cough or noct c/os  Pt denies any significant sore throat, dysphagia, itching, sneezing,  nasal congestion or excess/ purulent secretions,  fever, chills, sweats, unintended wt loss, pleuritic or exertional cp, hempoptysis, orthopnea pnd or leg swelling.    Also denies any obvious fluctuation of symptoms with weather or environmental changes or other aggravating or alleviating factors.    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  Oropharynx no thrush or excess pnd or cobblestoning.  No JVD or cervical adenopathy. Mild accessory muscle hypertrophy. Trachea midline, nl thryroid.  Trace crackles on inps R > L base.Marland Kitchen Hoover sign positive at mid inspiration. Regular rate and rhythm without murmur gallop or rub or increase P2 or edema.  Abd: no hsm, nl excursion. Ext warm without cyanosis or clubbing.    Assessment & Plan:    Review of Systems     Objective:   Physical Exam        Assessment & Plan:

## 2011-02-25 NOTE — Assessment & Plan Note (Signed)
Def needs the 02 at hs and with activity when limited by sob but not necessarily at rest.  If wants to stop completely with further wt loss will need ono on RA

## 2011-02-26 ENCOUNTER — Encounter: Payer: Self-pay | Admitting: Internal Medicine

## 2011-02-26 DIAGNOSIS — J45991 Cough variant asthma: Secondary | ICD-10-CM | POA: Insufficient documentation

## 2011-02-26 HISTORY — DX: Cough variant asthma: J45.991

## 2011-02-26 NOTE — Assessment & Plan Note (Signed)
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

## 2011-02-26 NOTE — Assessment & Plan Note (Signed)
Relatively minor component of her problem but now that she has good hfa is controlled well on symbicort 160 which could be changed to prn if doing well to see if she then flares or not

## 2011-03-18 NOTE — Op Note (Signed)
[name], Huff                ACCOUNT NO.:  [id number]   MEDICAL RECORD NO.:  [id number]          PATIENT TYPE:  INP   LOCATION:  0002                         FACILITY:  [facility]   PHYSICIAN:  Madlyn Frankel. Charlann Boxer, M.D.  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   PROCEDURE:  Left total knee replacement.   COMPONENTS USED:  DePuy rotating platform knee system, size 2.5 femur, 2  tibia, 10 mm insert, a 35 patellar button.   SURGEON:  Madlyn Frankel. Charlann Boxer, M.D.   ASSISTANT:  Yetta Glassman. Mann, P.A.-C.   ANESTHESIA:  Duramorph spinal plus MAC.   DRAINS:  None.   COMPLICATIONS:  None.   TOURNIQUET TIME:  41 minutes at 250 mmHg.   INDICATIONS FOR PROCEDURE:  Ms. Monsivais is a pleasant 65 year old female  referred for surgical consideration following knee arthroscopy and  persistent lateral knee discomfort.  Radiographs revealed concerns for  avascular changes in the lateral femoral condyle with collapse.  She had  failed conservative measures and wished to procedure with surgical  intervention.  We reviewed the risks and benefits of knee replacement  surgery.  Consent was obtained after reviewing the risks of infection,  DVT, component failure, need for revision surgery.   PROCEDURE IN DETAIL:  The patient was brought to the operating theater.  Once adequate anesthesia and preoperative antibiotics, Ancef, were  administered, the patient was positioned supine with a thigh tourniquet  placed.  Examination under anesthesia indicated the patient had a  correctable valgus deformity.   The leg was then prescrubbed and prepped and draped in a  sterile  fashion.  A midline incision was made followed by median parapatellar  arthrotomy with patellar subluxation rather than eversion.  Following  initial debridement including synovectomy, attention was first directed  to the patella.  Following parapatellar debridement of synovium, precut  measurement was approximately 19 mm.  I resected down 14 mm and then  used a 35 patellar button.  A metal shim was placed.  Attention was now  directed to the knee.  Following further exposure and cruciate  debridement, attention was first directed to the femur.  The femoral  canal was opened and irrigated to prevent fat emboli.  I then used an  intramedullary rod and at 5 degrees of valgus, I resected 10 mm of bone.  This did cut off the distal femur and did not require augmentation.  There was avascular collapse of the posterior lateral aspect of the  lateral femoral condyle.  I then sized the femur and the posterior  condylar axis matched up nicely with the epicondylar axis as well as  Whitesides line.  I then sized the femur to be a size 2.5.  I used the  anterior, posterior, and chamfer cutting block and  it was pinned in  position and the cuts were made. The final trochlear box cut was then  made based off the lateral aspect of the distal femur.   At this point, attention was directed to the tibia.  Tibial exposure was  obtained. Based on her anatomic appearance and soft tissues, I chose to  resect 8 mm of bone off the medial side of the tibia.  Following this  resection, meniscectomy was carried out. The cut surface appeared to be  better sized with a size 2 tibia. With the 2 tray in place, the  alignment rod was passed and the cup passed through the center of the  ankle in the AP and lateral planes.  Given this, it was pinned in  position, drilled, and keel punched.  A trial reduction was carried out  with the 2.5 femur and 10 mm insert.  The patella tracked without any  application of  pressure through the center of the trochlea.  The knee  came out to full extension and had excellent flexion.   At this point, all trial components were removed. The knee was  irrigated.  The knee was then injected with 0.25% Marcaine with  epinephrine and 1 mL of 30 mg Toradol.  The cement was mixed, the  components were then cemented in position, knee brought up to extension,  and a 10 mm insert, extruded cement was removed.  Once the cement had  cured, the knee was brought into flexion and the remaining cement was  removed from the knee.  Once I was satisfied I was unable to visualize  any other cement debrided, the final 10 mm insert was placed. The knee  was again irrigated and the tourniquet let down at 41 minutes.  I then  injected 5 mL of FloSeal in the medial and lateral gutters.  The  extensor mechanism was reapproximated with #1 Vicryl, the remainder of  the wound was closed with 2-0 Vicryl and running 4-0 Monocryl.  The  patient's knee was then cleaned, dried, and dressed sterilely with Steri-  Strips and a bulky sterile wrap.  She was taken to the recovery room in  stable condition.      Madlyn Frankel Charlann Boxer, M.D.  Electronically Signed     MDO/MEDQ  D:  06/03/2007  T:  06/03/2007  Job:  [id number]

## 2011-03-18 NOTE — Consult Note (Signed)
NAMEAJANAY, FARVE                ACCOUNT NO.:  [id number]   MEDICAL RECORD NO.:  [id number]          PATIENT TYPE:  INP   LOCATION:  1614                         FACILITY:  [facility]   PHYSICIAN:  Melissa L. Ladona Ridgel, MD  DATE OF BIRTH:  1946-05-02   DATE OF CONSULTATION:  06/05/2007  DATE OF DISCHARGE:                                 CONSULTATION   REASON FOR CONSULTATION:  Hypertension.   HISTORY OF THE PRESENT ILLNESS:  The patient is a 65 year old white  female with a past medical history significant for hypertension,  hypothyroidism, osteoarthritis and migraines.  The patient was admitted  on May 04, 2007 for an elective left knee arthroplasty.  The surgery  went well, but over the course of the afternoon on postoperative day one  the patient's blood pressure climbed until tonight when her values were  noted to be 192-200/86-106; I confirmed this with the manual blood  pressure cuff at the bedside.  Today the patient's antihypertensive  medications were restarted this morning and her blood pressure was okay  up until approximately this afternoon.   The patient describes no change in vision or headaches, or dizziness nor  does she have chest pain or shortness of breath.  However, the patient  report 5/10 pain at present and she just received pain medication.  I  have been asked to consult on her for her hypertension.   REVIEW OF SYSTEMS:  No headaches.  No change in vision.  No nausea or  vomiting.  She definitely has postoperative pain, but she does not  describe any chest pain or shortness of breath at this time.   PAST MEDICAL HISTORY:  The past medical history is as per the HPI.   PAST SURGICAL HISTORY:  1. Carpal tunnel release.  2. Cholecystectomy.  3. Plastic surgery.  4. Appendectomy.   FAMILY HISTORY:  The family history is positive for CAD, hypertension  and unspecified cancer.   SOCIAL HISTORY:  The patient is married.  She does not smoke.   ALLERGIES:  There  are no known drug allergies.   MEDICATIONS:  1. Norvasc 5 mg by mouth daily.  2. Vitamin D 1,000 units daily.  3. Celexa 20 mg by mouth daily.  4. Colace 100 mg by mouth twice a day.  5. Lovenox 30 mg subcu every 12 hours.  6. Ferrous sulfate 325 mg by mouth three times a day.  7. Synthroid 50 mcg by mouth daily.  8. Protonix 40 mg daily.  9. Maxzide 1 tablet by mouth daily.  10.Tylenol 325-650 mg by mouth every four hours as needed.  11.Benadryl 25 mg by mouth at bedtime as needed.  12.Enema of choice as needed.  13.Hydrocodone 1-2 tablets every four hours as needed.  14.Dilaudid 0.5-1 mg IV every two hours as needed.  15.Robaxin 500 mg by mouth every six as needed.  16.Reglan 10 mg IV every eight as needed.  17.Senna 1 tablet by mouth at bedtime as needed.  18.Fleets enema as needed.   PHYSICAL EXAMINATION:  VITAL SIGNS:  Temperature is 98.9,  pulse 93,  respirations 19, blood pressure 208/106, and saturation 92%.  GENERAL APPEARANCE:  Generally she is no acute distress.  HEENT:  The patient is normocephalic and atraumatic.  Pupils are equal,  round and react to light.  Extraocular muscles are intact.  Mucous  membranes are moist.  NECK:  The neck is supple.  There is no JVD.  No lymph nodes and no  carotid bruits.  CHEST:  The chest has decreased breath sounds with occasional basilar  crackles.  No wheezing.  HEART:  Cardiovascular shows regular rate and rhythm with positive S1  and S2.  No S3 or S4.  No appreciated murmurs, rubs or gallops.  ABDOMEN:  The abdomen is soft, nontender and nondistended with positive  bowel sounds.  EXTREMITIES:  The extremities show the left leg is dressed and is not  evaluated; the right leg is negative for edema.  NEUROLOGIC EXAMINATION:  Neurologically there are no focal deficits  noted.  Speech is clear.  Power is 5/5 in the upper extremities.  I  cannot assess the left leg.   LABORATORY DATA:  Sodium if 141, potassium 4.3, chloride 105,  CO2 32,  BUN 12, creatinine 0.7, and glucose is 116.  White count is 6.5,  hemoglobin 9.8, hematocrit 28.3, and platelets of 155,000.   ASSESSMENT AND PLAN:  This is a 65 year old white female who is status  post left knee replacement and whose course has been complicated by  hypertension and pain.   Hypertension, acute on chronic.  We will continue her triamterene  hydrochlorothiazide.  I will give her an additional dose of Norvasc  tonight and increase her every daily dose to 10 mg every P.M.  If her  blood pressure remains elevated an hour after receiving additional  Norvasc I will go ahead and give her some clonidine as needed.  I will  decrease her intravenous fluids, which may be contributing to her  hypertensive episode.  It would be appropriate to  try to wean her to a saline lock if possible in the morning since the  patient is eating and drinking.  I will as the [facility] to  reevaluate her in the morning and will follow her during the course of  the night, and intervene where appropriate.      Melissa L. Ladona Ridgel, MD  Electronically Signed     MLT/MEDQ  D:  06/05/2007  T:  06/05/2007  Job:  [id number]   cc:   Gordy Savers, MD  [address] [place]  [place] [address]

## 2011-03-18 NOTE — Letter (Signed)
May 18, 2007    Madlyn Frankel. Charlann Boxer, M.D.  [facility]  [address]  [address]  [place], [place] [address]   RE:  [name], [name]  MRN:  [id number]  /  DOB:  Sep 07, 1946   Dear Molli Hazard:   [name] is a 65 year old patient followed in our internal  medicine practice.  Medical problems include control of hypertension,  psoriatic arthritis, hypothyroidism, and Barrett's esophagus.  She was  seen for a comprehensive exam on 05/18/2007.  At that time she was  clinically stable and denied any cardiopulmonary complaints.  Her  clinical exam was unremarkable and an EKG was normal.   She is anticipating left total knee replacement surgery later this  month.  There are no contraindications to the anticipated surgery.   If perioperative concerns arise please do not hesitate to contact this  office.    Sincerely,      Gordy Savers, MD  Electronically Signed    PFK/MedQ  DD: 05/21/2007  DT: 05/21/2007  Job #: [id number]

## 2011-03-18 NOTE — H&P (Signed)
[name], [name]                ACCOUNT NO.:  [id number]   MEDICAL RECORD NO.:  [id number]          PATIENT TYPE:  INP   LOCATION:  NA                           FACILITY:  [facility]   PHYSICIAN:  Madlyn Frankel. Charlann Boxer, M.D.  DATE OF BIRTH:  January 22, 1946   DATE OF ADMISSION:  06/03/2007  DATE OF DISCHARGE:                              HISTORY & PHYSICAL   PROCEDURE:  Left total knee arthroplasty.   CHIEF COMPLAINT:  Left knee pain.   HISTORY OF PRESENT ILLNESS:  A 65 year old female with a history of left  knee pain which has been persistent, progressive in nature secondary to  osteoarthritis.  She had been refractory to all conservative treatments.  She had been presurgically assessed by Dr. Amador Cunas.   PAST MEDICAL HISTORY:  1. Osteoarthritis.  2. Migraines.  3. Hypertension.  4. Hypothyroidism.   PAST SURGICAL HISTORY:  1. Carpal tunnel release, both hands.  2. Cholecystectomy.  3. Plastic surgery.  4. Appendectomy.   FAMILY HISTORY:  Heart disease, arthritis, hypertension, cancer.   SOCIAL HISTORY:  She is married.  Primary caregiver will be husband  after surgery.   ALLERGIES:  NO KNOWN DRUG ALLERGIES.   MEDICATIONS:  1. Enbrel injection one q. Weekly.  2. Naproxen 500 mg one p.o. b.i.d.  3. Sulfasalazine 500 mg two q.a.m. one q.p.m.  4. Tramadol 50 mg one p.o. t.i.d.  5. Tylenol 500 mg 3 tablets three times daily.  6. Levothyroxine 150 mcg one p.o. daily.  7. Omeprazole 20 mg one p.o. daily.  8. Triamterene HCTZ 37.5/25 one p.o. daily.  9. Citalopram 20 mg one p.o. daily.  10.Vitamin D 1000 units one a day.   REVIEW OF SYSTEMS:  NEUROLOGY:  Recurring headaches.  HEMATOLOGY:  Easily bruised.  Otherwise see HPI.   PHYSICAL EXAMINATION:  VITAL SIGNS:  Pulse 96, respirations 18, blood  pressure 155/94.  GENERAL:  Awake, alert and oriented, well-developed, well-nourished, no  acute distress.  NECK:  Supple.  No carotid bruits.  CHEST/LUNGS:  Clear to auscultation  bilaterally.  BREASTS:  Deferred.  HEART:  Regular rate and rhythm without gallops, clicks, rubs or  murmurs.  ABDOMEN:  Soft, nontender, nondistended.  Bowel sounds present.  GENITOURINARY:  Deferred.  EXTREMITIES:  Left leg with significant valgus deformity.  Walks with  antalgic gait.  Range of motion 5-115.  SKIN:  No cellulitis.  No sign of infection.  NEUROLOGIC:  Intact distal sensibilities.   LABORATORY DATA:  Labs, EKG and chest x-ray pending.   IMPRESSION:  1. Osteoarthritis.  2. Hypertension.  3. Hypothyroidism.   PLAN OF ACTION:  Left total knee arthroplasty [name] July 31 by  surgeon Dr. Durene Romans.  Risks and complications were discussed.  Questions were encouraged, answered and reviewed.   At time of history and physical postoperative medications including:  Lovenox, aspirin, Robaxin, Colace, MiraLax and iron were given.  Postoperative pain medication be supplied after surgery.     ______________________________  [name] [address], [place]      Madlyn Frankel. Charlann Boxer, M.D.  Electronically Signed    BLM/MEDQ  D:  05/28/2007  T:  05/28/2007  Job:  [id number]   cc:   Gordy Savers, MD  [address] [place]  [place] [address]

## 2011-03-21 NOTE — Op Note (Signed)
NAMECHANTAVIA, BAZZLE NO.:  [id number]   MEDICAL RECORD NO.:  [id number]          PATIENT TYPE:  AMB   LOCATION:  ENDO                         FACILITY:  [facility]   PHYSICIAN:  Georgiana Spinner, M.D.    DATE OF BIRTH:  Jul 30, 1946   DATE OF PROCEDURE:  01/28/2005  DATE OF DISCHARGE:                                 OPERATIVE REPORT   PROCEDURE:  Colonoscopy.   INDICATION:  Colon cancer screening, hemorrhoidal rectal bleeding, diarrhea.   ANESTHESIA:  None further given.   PROCEDURE:  With the patient mildly sedated in the left lateral decubitus  position a rectal examination was performed which revealed hemorrhoid  change.  Subsequently, the Olympus videoscopic colonoscope was inserted into  the rectum and under direct vision to the cecum, identified by ileocecal  valve and appendiceal orifice, both which were photographed appeared normal.  From this point the colonoscope was slowly withdrawn and taking  circumferential view of colonic mucosa, stopping to take random biopsies  along the way until we reached the rectum, which appeared normal on direct  and showed hemorrhoids on retroflexed view.  The endoscope was straightened  and withdrawn.  The patient's vital signs and pulse oximetry remained  stable. The patient tolerated the procedure well without apparent  complications.   FINDINGS:  Internal hemorrhoids, some diverticulosis in the sigmoid colon  with some tortuosity noted here as well.  Otherwise unremarkable  colonoscopic examination to the cecum.   PLAN:  Await biopsy report.  The patient will call me for results and follow  up with me as an outpatient.      GMO/MEDQ  D:  01/28/2005  T:  01/28/2005  Job:  [id number]   cc:   Gordy Savers, M.D. [facility]

## 2011-03-21 NOTE — Op Note (Signed)
NAMEMERIE, WULF NO.:  [id number]   MEDICAL RECORD NO.:  [id number]          PATIENT TYPE:  AMB   LOCATION:  ENDO                         FACILITY:  [facility]   PHYSICIAN:  Georgiana Spinner, M.D.    DATE OF BIRTH:  02-Nov-1946   DATE OF PROCEDURE:  01/28/2005  DATE OF DISCHARGE:  01/28/2005                                 OPERATIVE REPORT   PROCEDURE:  Upper endoscopy with biopsy.   INDICATIONS:  Gastroesophageal reflux disease.   ANESTHESIA:  Demerol 75, Versed 6 milligrams.   PROCEDURE:  With the patient mildly sedated in the left lateral decubitus  position, the Olympus videoscopic endoscope was inserted in the mouth and  passed under direct vision into the esophagus which appeared normal until we  reached the distal esophagus and there were changes of Barrett's esophagus  photographed and biopsied. We entered into the stomach.  The fundus, body,  antrum, duodenal bulb, second portion of the duodenum were visualized.  From  this point the endoscope was slowly withdrawn taking circumferential views  of duodenal mucosa until the endoscope had been pulled back in the stomach  and placed on retroflexion to view the stomach from below.  The endoscope  was then straightened and withdrawn taking circumferential views of the  remaining gastric and esophageal mucosa. The patient tolerated procedure  well without apparent complication.  Vital signs remained stable.   IMPRESSION:  Probable Barrett's esophagus.  Await biopsy report. The patient  will call me for results and follow up with me as an outpatient.      GMO/MEDQ  D:  03/04/2005  T:  03/04/2005  Job:  [id number]

## 2011-03-21 NOTE — H&P (Signed)
[name], [name]                ACCOUNT NO.:  [id number]   MEDICAL RECORD NO.:  [id number]          PATIENT TYPE:  IPS   LOCATION:  4149                         FACILITY:  [facility]   PHYSICIAN:  Erick Colace, M.D.DATE OF BIRTH:  1946-04-23   DATE OF ADMISSION:  04/22/2005  DATE OF DISCHARGE:                                HISTORY & PHYSICAL   CHIEF COMPLAINT:  Generalized weakness.   A 65 year old female admitted on Mar 24, 2005, with fever, cough, and  general malaise.  Chest x-ray showed severe bilateral air space disease.  Empiric systemic steroids were initiated.  Blood cultures were initially  negative and maintained on IV Rocephin.  An echocardiogram showed ejection  fraction of 55 to 65%, no wall abnormalities.  Critical care medicine  followed her for pneumonia as well as ARDS.  She had respiratory failure,  underwent tracheostomy on April 11, 2005, downsized to a #4 on April 21, 2005.  She stayed capped all night last night and did not sleep at all because she  felt air hunger.  She was placed on subcutaneous Lovenox for DVT  prophylaxis.  She was noted to have anemia of chronic disease. She was  transfused 2 units of packed red blood cells on April 15, 2005, and her post  transfusion hemoglobin was 10.4.  Her last hemoglobin was on April 20, 2005,  which was down at 9.2.  Platelets were 262,000.   REVIEW OF SYSTEMS:  Feels weaker on the right side with only minimal  numbness in the hand.  She had some problems with sleep as noted above.   PAST MEDICAL HISTORY:  1.  Hypertension.  2.  Psoriatic arthritis.  3.  Hypothyroidism.  4.  Cholecystectomy.  5.  Appendectomy.  6.  Surgery for benign breast tumor.   FAMILY HISTORY:  Noncontributory.   SOCIAL HISTORY:  Her husband works full time at [employer].  One-level  home, five steps to entry.  Two children who work full time.  Husband can  assist.   FUNCTIONAL STATUS:  Independent prior to admission.  Current  functional  status: Minimum assistance for sit to stand.  Minimum assistance bed  mobility.  Maximum assistance gait 5 steps.   MEDICATIONS AT HOME:  1.  Methotrexate 7 tablets per week.  2.  Arava 20 mg daily.  3.  Folate 1 p.o. daily.  4.  Hydrochlorothiazide 25 mg p.o. daily.  5.  Omeprazole 20 mg p.o. daily.  6.  Synthroid 0.025 mg p.o. daily.  7.  Prempro 1 p.o. daily.   LABORATORY DATA:  Last BUN 16, creatinine 0.6 on June 19.   PHYSICAL EXAMINATION:  VITAL SIGNS:  O2 saturation 93%.  She is on 02 at 1.5  liters nasal cannula.  Blood pressure 137/77, pulse 77, temperature 98,  respirations 20.  GENERAL:  Thin female in no acute distress.  EYES:  Not injected, anicteric.  Extraocular muscles intact.  ENT:  Normal dentition.  NECK:  Normal except for tracheostomy with metal tracheostomy  #4.  LUNGS:  Respiratory effort is good.  Lungs have decreased  breath sounds to  auscultation bilaterally.  HEART:  Regular rate and rhythm.  No rubs or extra sounds.  ABDOMEN:  Soft, nontender per patient.  EXTREMITIES:  Small petechiae feet and hands, some confluent.   Range of motion is reduced in right shoulder actively.  Motor strength is 4+  in right deltoid, biceps, triceps, finger flexor.  Hip flexor 3+, 4+ in left  quadriceps, PA, and gastrocnemius, 4- right deltoid, biceps, triceps, finger  flexor, hip flexor 3+, quadriceps, PA and gastrocnemius 4-.   Sensation is reduced bilateral lower extremities to light touch.   Orientation, mood, memory, and judgment are all intact.   IMPRESSION:  1.  Functional deficits due to pneumococcal pneumonia with adult respiratory      distress syndrome, sepsis, requiring tracheostomy.  Her tracheostomy has      been downsized as of June 19 but did not tolerate plugging overnight.  2.  Mild right hemiparesis which she states is about four to five days in      duration.  She was actually stronger just prior to that time. Will check      a CT of  the head to rule out infarct on the left brain.  3.  Hypertension.  Continue to monitor.  May need to reinstitute      hydrochlorothiazide.  4.  Psoriatic arthritis on methotrexate and Arava at home, is off this      currently.  Continue on prednisone taper.  5.  Hypothyroidism.  Continue Synthroid.  6.  Anemia of chronic disease.  Ferrous sulfate.  Monitor hemoglobin.      Transfuse if drops. Will check some stools for occult blood x 3.   Estimated length of stay is 10 to 14 days.  Prognosis for functional  improvement is good. Estimated function discharge is supervision for  ambulation, modified independent for transfers.       AEK/MEDQ  D:  04/22/2005  T:  04/22/2005  Job:  [id number]   cc:   Oley Balm. Sung Amabile, M.D. [facility]   Gordy Savers, M.D. [facility]

## 2011-03-21 NOTE — H&P (Signed)
Amber [name], [name] NO.:  [id number]   MEDICAL RECORD NO.:  [id number]          PATIENT TYPE:  EMS   LOCATION:  MAJO                         FACILITY:  [facility]   PHYSICIAN:  Amber [name], M.D. LHCDATE OF BIRTH:  Mar 22, 1946   DATE OF ADMISSION:  03/23/2005  DATE OF DISCHARGE:                                HISTORY & PHYSICAL   CHIEF COMPLAINT:  Shortness of breath.   HISTORY OF PRESENT ILLNESS:  The patient is a 65 year old white female with  a history of psoriatic arthritis treated with immunosuppressant medications.  She presented with a 4 day history to a local urgent care complaining of  fever, sore throat, malaise, and anorexia.  She has had worsening cough,  which has been nonproductive, and shortness of breath.  Evaluation at the  urgent care revealed severe bilateral air space disease on chest x-ray, and  the patient is now admitted for further evaluation and treatment of her  bilateral diffuse infiltrates.  In the urgent care setting, and also in the  emergency department here, the patient was noted to be hypoxic on room air.   The patient has a long history of psoriatic arthritis, and has been treated  with methotrexate weekly, as well as Arava 20 mg daily.  Due to refractory  oral ulcers, these medications have been on hold for the past 2 weeks.   PAST MEDICAL HISTORY:  Also pertinent for history of -  1.  Hypertension.  2.  Remote appendectomy.  3.  Cholecystectomy.  4.  She is gravida 2, para 2, abortus 0.  5.  She has had cosmetic abdominoplasty and breast surgery in the past.   PRESENT MEDICAL REGIMEN:  1.  Methotrexate 7 pills weekly.  2.  Arava 20 mg daily.  (Both these 2 medications have been held for the past 2 weeks, except today  the patient did take Arava.)  1.  Folic acid 1 mg daily.  2.  Ultram p.r.n. pain.  3.  Tylenol p.r.n. pain.  4.  Aleve p.r.n. pain.  5.  Hydrochlorothiazide 25 mg daily.  6.  Omeprazole 20 mg  daily.  7.  Synthroid 0.025 mg daily.  8.  Prempro daily.   REVIEW OF SYSTEMS:  Exam is otherwise fairly unremarkable.  The patient  denies any prior history of pneumonia.  She did have esophagogastroduo-  denoscopy and colonoscopy performed by Dr. Virginia [name] 2 weeks ago.  She is followed  closely by Dr. Jimmy [name].   SOCIAL HISTORY:  She is married.  A nondrinker, nonsmoker.  Two children.  Works full time.   PHYSICAL EXAMINATION:  GENERAL:  A well-developed, healthy-appearing white  female in no acute distress at rest.  Nasal oxygen was in place.  VITAL SIGNS:  Pulse was 108, respirations rate 20, blood pressure 110/60,  temperature 97.6.  SKIN:  Warm and dry.  HEAD/NECK:  Normal fundi.  Conjunctivae clear.  Oropharynx was unremarkable,  perhaps slightly injected.  No obvious oral ulcerations noted.  Neck with no  neck vein distention, no adenopathy, no thyroid enlargement.  CHEST:  Diffuse dry  crackles throughout both lung fields.  CARDIOVASCULAR:  Almost inaudible heart sounds due to the pulmonary rales.  No murmur could be appreciated.  ABDOMEN:  Soft and nontender.  EXTREMITIES:  No edema.  Peripheral pulses intact.   IMPRESSION:  Diffuse bilateral pulmonary infiltrates.  Rule out infection,  drug toxicity, interstitial lung disease, etc.   DISPOSITION:  The patient will be admitted to the hospital to an ICU  setting.  Blood cultures will be obtained, and the patient will be placed on  empiric antibiotics.  The patient will have a high resolution CT scan, and  will consider for possible bronchoscopy.  Pulmonary will be consulted.      PFK/MEDQ  D:  03/23/2005  T:  03/24/2005  Job:  [id number]

## 2011-03-21 NOTE — Discharge Summary (Signed)
NAMEALYSSAMARIE, MOUNSEY                ACCOUNT NO.:  [id number]   MEDICAL RECORD NO.:  [id number]          PATIENT TYPE:  INP   LOCATION:  6706                         FACILITY:  [facility]   PHYSICIAN:  Oley Balm. Sung Amabile, M.D. [facility] OF BIRTH:  Jul 30, 1946   DATE OF ADMISSION:  03/23/2005  DATE OF DISCHARGE:  04/22/2005                           DISCHARGE SUMMARY - REFERRING   DISCHARGE DIAGNOSES:  1.  Ventilator dependent respiratory failure with acute respiratory distress      syndrome and pneumococcal pneumonia.  2.  Anemia.  3.  Hyperglycemia.  4.  Hypothyroidism.   HISTORY OF PRESENT ILLNESS:  Ms. Ranker is a 65 year old white female  smoker who has a history of psoriatic arthritis and has been on treatment  with methotrexate and Ultram.  She presented with a three to four day  history of malaise, shortness of breath, chest pressure and nonproductive  cough.  She denied fevers.  Chest x-ray was consistent with bilateral air  space disease with confluence in right upper lobe.  She was admitted for  further evaluation and treatment.  Her BNP was noted to 1008 on admission.  Her initial diagnoses were hypoxic respiratory failure and __________  psoriatic arthritis, bilateral pulmonary infiltrates, hyponatremia, acute  renal insufficiency and elevated BNP.   LABORATORY DATA:  Arterial blood gas with pH 7.46, pCO2 58, pO2 76 with  bicarb of 42, total CO2 of 44, O2 saturation 95% on __________ .  The wbc is  15.3, hemoglobin 9.4, hematocrit 28.9, platelets 278.  Note her hemoglobin  reached a low of 7.5 with hematocrit of 23.  ESR was 131.  INR 1.0.  Sodium  141, potassium 4.9, chloride 94, CO2 39, glucose 127, BUN 39, creatinine 0.6  with peak of 1.7, calcium 8.3.  AST 68, ALT 35, ALP 65, total bilirubin 0.7.  Lactic acid 1.4.  LDH 319.  Troponin I 0.01.  TSH 1.073.  Iron 22, total  iron binding capacity 235, percent saturation 9, B12 greater than 2000,  folate 19.4, ferritin 256.   Urinalysis was unremarkable.  Blood culture  shows Staphylococcal pneumoniae in two out two blood culture, pan sensitive.  BL shows no growth.   Chest x-ray shows __________  stable, bilateral air space disease is  significant and stable, no pneumothoraces are seen.  No definite effusions  are seen.  Chest x-ray on April 09, 2005, demonstrates left percutaneous  indwelling central catheter and removal of right percutaneous indwelling  central catheter.   HOSPITAL COURSE BY DISCHARGE DIAGNOSIS:  DISCHARGE DIAGNOSIS #1 - VENTILATOR  DEPENDENT RESPIRATORY FAILURE AND HYPOXIC RESPIRATORY WITH STREPTOCOCCUS  SEPSIS:  Patient was admitted to [facility].  Initially  did not require mechanical ventilatory support.  Her pulmonary status  continued to decline during this admission.  She required noninvasive  mechanical ventilatory support which she failed to respond to and required  orotracheal intubation on Mar 27, 2005, and was treated with the standard  acute respiratory distress syndrome protocol with pneumonia.  She failed  weaning protocol, therefore required a tracheostomy by Dr. Lucky Cowboy on  April 11, 2005, with a #6 trach.  She was managed and liberated from  mechanical ventilatory support by April 16, 2005, with a downsize for trach  to a #4 on April 18, 2005.  She was transferred to rehabilitation services on  April 22, 2005, in improved condition.   DISCHARGE DIAGNOSIS #2 - ANEMIA:  Anemia was stable.  Required EPO protocol  for a short period of time.   DISCHARGE DIAGNOSIS #3 - HYPERGLYCEMIA:  This was controlled with ICU  hyperglycemia protocol.   DISCHARGE DIAGNOSIS #4 - HYPOTHYROIDISM:  Stable.   DISCHARGE DIAGNOSIS #5 - DECONDITIONING:  She was transferred to  rehabilitation services on April 22, 2005, for rehabilitation.   DISCHARGE MEDICATIONS:  Per rehab services.   DISPOSITION/CONDITION ON DISCHARGE:  Her respiratory had resolved.  She does  have a  known history of psoriatic arthritis with prolonged immunosuppression  with methotrexate.  This will be followed while in the rehab services.      Brett Canales Minor, A.C.N.P. [facility]    ______________________________  Oley Balm Sung Amabile, M.D. [facility]    SM/MEDQ  D:  07/17/2005  T:  07/17/2005  Job:  [id number]   cc:   Anastasio Auerbach, MD  Fax: [phone]   Marcelyn Bruins, M.D. [facility]  [address]  [place]  [place] [address]   Lucky Cowboy, MD  [phone] [place]  [place] [address]  Fax: [phone]   Gordy Savers, M.D. [facility]  [address] [place]  [place] [address]

## 2011-03-21 NOTE — Op Note (Signed)
[facility]  Patient:    [name], [name]                       MRN: [id number] Proc. Date: 01/04/01 Adm. Date:  [id number] Disc. Date: [id number] Attending:  Brandy Hale CC:         Gordy Savers, M.D.   Operative Report  PREOPERATIVE DIAGNOSIS:  Chronic cholecystitis with cholelithiasis.  POSTOPERATIVE DIAGNOSIS:  Acute cholecystitis with cholelithiasis.  OPERATION PERFORMED:  Laparoscopic cholecystectomy.  SURGEON:  Angelia Mould. Derrell Lolling, M.D.  FIRST ASSISTANT:  Gita Kudo, M.D.  OPERATIVE INDICATIONS:  This is a 65 year old white female, who has been known to have gallstones for 10 years but has been relatively asymptomatic. Over the past month, she has had at least two distinct episodes of right upper quadrant pain with nausea and vomiting which would then resolve.  She has a chronic history of fatty food intolerance.  Liver function tests are normal.  She has a past history of an appendectomy and an abdominoplasty.  She is brought to the operating room electively for cholecystectomy.  OPERATIVE TECHNIQUE:  Following the induction of general endotracheal anesthesia, the patients abdomen was prepped and draped in a sterile fashion. Marcaine 0.5% with epinephrine was used as a local infiltration anesthetic.  A curved incision was made transversely below the umbilicus, through the previous scar.  Dissection was carried down to the fascia which was incised in the midline.  The abdominal cavity entered carefully under direct vision. There were no adhesions under the umbilicus.  A 10 mm Hasson trocar was inserted and secured with pursestring suture of 0 Vicryl.  Pneumoperitoneum was created.  Video camera was inserted.  A 10 mm trocar was placed in the subxiphoid region and two 5 mm trocars placed in the right mid abdomen.  The patient had a healthy-appearing liver, moderate adhesions from the gallbladder, and the dome of the right  lobe of the liver to the anterior abdominal wall on the diaphragm.  Minimal adhesions in the lower abdomen.  The gallbladder was clearly somewhat chronically thickened and discolored.  The anatomy of the cystic duct, cystic artery, and common bile duct were conventional.  The stomach, duodenum, large intestine, and small intestine were grossly normal.  The adhesions were all taken down.  The fundus of the gallbladder was elevated.  We exposed the infundibulum of the gallbladder and dissected out the cystic duct and cystic artery, and the common bile duct was clearly visible.  We isolated the cystic duct and clearly identified the junction of the cystic duct with the infundibulum of the gallbladder and clearly identified the common bile duct.  After creating a large window behind the cystic duct and gallbladder, the cystic duct was secured with metal clips and divided.  The cystic artery was isolated, secured with metal clips, and divided.  The gallbladder was dissected from its bed with electrocautery and removed through the umbilical port.  The operative field was irrigated with saline.  At the completion of the case, there was no bleeding and no bile leak whatsoever.  The trocars were removed under direct vision, and there was no bleeding from the trocar sites.  The fascia at the umbilicus was closed with 0 Vicryl sutures.  The pneumoperitoneum was released.  The skin incisions were closed with subcuticular sutures of 4-0 Vicryl and Steri-Strips.  Clean bandages were placed and the patient taken to the recovery room in stable condition.  Estimated  blood loss was about 10 cc.  Complications none. Sponge, needle and instrument counts were correct. DD:  01/04/01 TD:  01/05/01 Job: [id number]

## 2011-03-21 NOTE — Op Note (Signed)
NAME:  [name], [name]                ACCOUNT NO.:  [id number]   MEDICAL RECORD NO.:  [id number]          PATIENT TYPE:  AMB   LOCATION:  ENDO                         FACILITY:  [facility]   PHYSICIAN:  George M. Orr, M.D.    DATE OF BIRTH:  12/17/1945   DATE OF PROCEDURE:  01/28/2005  DATE OF DISCHARGE:  01/28/2005                                 OPERATIVE REPORT   PROCEDURE:  Upper endoscopy with biopsy.   INDICATIONS:  Gastroesophageal reflux disease.   ANESTHESIA:  Demerol 75, Versed 6 milligrams.   PROCEDURE:  With the patient mildly sedated in the left lateral decubitus  position, the Olympus videoscopic endoscope was inserted in the mouth and  passed under direct vision into the esophagus which appeared normal until we  reached the distal esophagus and there were changes of Barrett's esophagus  photographed and biopsied. We entered into the stomach.  The fundus, body,  antrum, duodenal bulb, second portion of the duodenum were visualized.  From  this point the endoscope was slowly withdrawn taking circumferential views  of duodenal mucosa until the endoscope had been pulled back in the stomach  and placed on retroflexion to view the stomach from below.  The endoscope  was then straightened and withdrawn taking circumferential views of the  remaining gastric and esophageal mucosa. The patient tolerated procedure  well without apparent complication.  Vital signs remained stable.   IMPRESSION:  Probable Barrett's esophagus.  Await biopsy report. The patient  will call me for results and follow up with me as an outpatient.      GMO/MEDQ  D:  03/04/2005  T:  03/04/2005  Job:  [id number] 

## 2011-03-21 NOTE — Discharge Summary (Signed)
NAMEWALTA, [name]                ACCOUNT NO.:  [id number]   MEDICAL RECORD NO.:  [id number]          PATIENT TYPE:  INP   LOCATION:  1614                         FACILITY:  [facility]   PHYSICIAN:  Madlyn Frankel. Charlann Boxer, M.D.  DATE OF BIRTH:  11-09-1945   DATE OF ADMISSION:  06/03/2007  DATE OF DISCHARGE:  06/06/2007                               DISCHARGE SUMMARY   ADMITTING DIAGNOSIS:  1. Osteoarthritis.  2. Migraines.  3. Hypertension.  4. Hypothyroidism.   DISCHARGE DIAGNOSES:  1. Osteoarthritis.  2. Migraines.  3. Hypertension.  4. Hypothyroidism.  5. Postoperative hyponatremia.  6. Postoperative hypokalemia.   CONSULTATION:  None.   PROCEDURE:  Left total knee replacement.  Surgeon Dr. Durene Romans.  Assistant Dwyane Luo.   BRIEF HISTORY OF PRESENT ILLNESS:  [name] is a very pleasant 65-  year-old female with history of left knee pain which has been persistent  and progressive in nature secondary to osteoarthritis.  Refractory to  all conservative treatments.  Presurgical assessed by Dr. Amador Cunas.   LABORATORY DATA:  Preadmission CBC:  Hematocrit 36.9, postop day #1  28.3, postop day #2 28.5 and stable.  Coagulation within normal limits.  Routine chemistries preoperative BUN 24, all others normal.  Postop day  #1, glucose 116, all others normal.  Postop day #2, glucose 147,  chloride 89, sodium 127 checked same day.  In a day, sodium 138,  potassium 3.3, glucose 132.  Postop day #3, glucose 114, all others  within normal limits.  Kidney function within normal limits.  GI workup  all within normal limits.  UA has did show some hyaline casts.   CARDIOLOGY:  EKG normal sinus rhythm.   RADIOLOGY:  Chest two-view no active disease.   HOSPITAL COURSE:  The patient underwent left total knee replacement,  tolerated procedure well, was admitted to the Orthopedic Floor.  She  remained afebrile throughout the course of stay.  She remained  neurovascularly intact over the  left lower extremity.  She was able to  do a straight-leg raise after postop day #1.  Dressing was changed after  postop day #1.  Wound showed no significant drainage throughout her  course of stay.  Calves remained soft.  She did progress nicely on  physical therapy and was able to ambulate with the use of a rolling  walker up to 150 feet prior to discharge.  Did have some mild  hypertension which was evaluated by Dr. Ladona Ridgel.  Received some  clonidine, Norvasc which helped to decrease her pressure.  [place]  continued to follow after the consult on August 1-2, 2008.  Hyponatremia.  Suspected SIADH.  Lasix should help.  Recommended  restricting the  free water to 1.5 liters a day and monitor B-met.  On  the August 3, hyponatremia was improved with the Lasix and fluid  restriction was likely secondary to the SIADH symptoms of inappropriate  ADH.  Hypertension was stable as well.  Orthopedically, she did well.  Continued to ambulate well and on August 3, was ready for discharge.   DISCHARGE DISPOSITION:  Discharged home with  home health care PT stable  and improved condition.   FOLLOWUP:  1. Discharge follow-up with Dr. Charlann Boxer [phone] in 2 weeks.  2. Call primary doctor to follow up in one week about hypertension.Marland Kitchen   DISCHARGE MEDICATIONS:  1. Lovenox 30 mg p.o. subcu q. 12 times 11 days.  2. Enteric-coated aspirin 325 mg one p.o. daily after Lovenox      completed x4 weeks.  3. Robaxin 500 mg p.o. q. 6h.  4. Colace 100 mg p.o. b.i.d.  5. MiraLax 17 grams daily.  6. Iron 325 mg p.o. t.i.d. x2 weeks.  7. Percocet 7.5/325 one to two p.o. q. 4-6 p.r.n. pain.  8. Talopram 20 mg p.o. q.a.m.  9. Amlodipine 5 mg p.o. q.a.m.  10.Levothyroxine 150 mcg  p.o. q.a.m.  11.Triamterene/HCTZ 37.5-25 one p.o. q.a.m.  12.Tramadol 50 mg, discontinue Omeprazole 20 mg p.o. q.a.m.  13.Sulfasalazine 500 two in a.m., one in p.m.  14.Vitamin D 1000 units daily.  15.Tylenol Extra Strength 500 mg 3 tablets  30 times daily p.r.n.  16.Maxzide once daily.  17.Norvasc 10 mg once daily.   DISCHARGE DIET:  Regular.   DISCHARGE WOUND CARE:  Keep dry.   DISCHARGE PHYSICAL THERAPY:  Weightbearing as tolerated, __________  rolling walker, transition to single point cane in 2 weeks.  Goals of  physical therapy would be to maximize strength, minimize pain, maximize  range of motion, encourage independence of activities of daily living.  Work on Investment [employer], operational and proprioception.     ______________________________  [name], [place]      Madlyn Frankel. Charlann Boxer, M.D.  Electronically Signed    BLM/MEDQ  D:  06/28/2007  T:  06/28/2007  Job:  [id number]   cc:   Gordy Savers, MD  [address] [place]  [place] [address]

## 2011-03-21 NOTE — Discharge Summary (Signed)
[name], LOTTER NO.:  [id number]   MEDICAL RECORD NO.:  [id number]          PATIENT TYPE:  IPS   LOCATION:  4149                         FACILITY:  [facility]   PHYSICIAN:  Ellwood Dense, M.D.   DATE OF BIRTH:  Oct 05, 1946   DATE OF ADMISSION:  04/22/2005  DATE OF DISCHARGE:  05/02/2005                                 DISCHARGE SUMMARY   DISCHARGE DIAGNOSES:  1. Pneumococcal pneumonia with acute respiratory distress syndrome and      sepsis.  2. Deconditioning secondary to #1.  3. Tracheostomy April 21, 2005 since decannulated.  4. Subcutaneous Lovenox deep vein thrombosis prophylaxis.  5. Hypertension.  6. Hyperthyroidism.  7. Psoriatic arthritis.  8. Anemia of chronic disease.   HISTORY OF PRESENT ILLNESS:  This 65 year old female admitted May 22 with  fever, cough, generalized weakness.  Her chest x-ray showed severe bilateral  air space disease.  Empiric systemic steroids initiated.  Blood cultures  negative.  Maintained on intravenous Rocephin.  Echocardiogram with ejection  fraction 55-65% with no wall abnormalities.  Critical care medicine follow  up for pneumonia, acute respiratory distress syndrome, tracheostomy placed  April 11, 2005, downsized to a #4 on April 21, 2005, subcutaneous Lovenox for  deep vein thrombosis prophylaxis.  Anemia of critical illness 7.0,  transferred 2 units of packed red blood cells April 15, 2005 with hemoglobin  improved at 10.4.   PAST MEDICAL HISTORY:  See discharge diagnoses.   ALLERGIES:  None.   SOCIAL HISTORY:  Lives with husband.  Works full-time at [employer].  One-  level home, five steps to entry.  Two children work full-time.  Husband  cannot assist on discharge.   MEDICATIONS:  Prior to admission:  1. Methotrexate weekly.  2. Arava 20 mg daily.  3. Folic acid daily.  4. Hydrochlorothiazide 25 mg daily.  5. Prilosec 20 mg daily.  6. Synthroid 0.0025 mg daily.  7. Prempro daily.   HOSPITAL COURSE:   The patient with progressive gains while on rehab services  with therapies initiated on a b.i.d. basis.  The following issues were  followed during patient's rehab course.  Deconditioning secondary to  pneumonia, acute respiratory distress syndrome, sepsis, remained afebrile,  antibiotics had since been discontinued.  The patient was supervision level  for physical occupational therapy except minimal assist for stairs.  Endurance and strength had greatly improved.  Her trach tube had since been  removed.  Dressing applied.  Slow taper of her oxygen.  She had been tapered  off of her steroid therapy.  He had remained on subcutaneous Lovenox for  deep vein thrombosis prophylaxis throughout her rehab course.  This was  discontinued again at discharge.  Blood pressures maintained.  Her  hydrochlorothiazide as prior to admission had been resumed.  Diastolic  pressure 76-87.  She had no headache or dizziness.  She remained on her  hormone supplement for hypothyroidism.  She had a long history of psoriatic  arthritis, on methotrexate and Arava.  Her primary M.D. will continue to  adjust medications as advised.  Anemia of chronic disease,  latest hemoglobin  of 10.1, hematocrit of 31.2.  She had been transfused during her hospital  course and remained on iron supplement.   CONDITION ON DISCHARGE:  The patient was discharged to home in stable  condition with ongoing therapies as advised.   DISCHARGE MEDICATIONS:  1. Synthroid 25 mcg daily.  2. Ferrous sulfate 325 mg three times daily.  3. Hydrochlorothiazide 25 mg daily.  4. Naproxen 500 mg twice daily.  5. Protonix 40 mg daily.  6. Tylenol as needed.   ACTIVITY:  As tolerated.   DIET:  Regular.   SPECIAL INSTRUCTIONS:  Continue therapies to promote overall mobility and  well-being.  Occlusive dressing to trach site until healed.   FOLLOW UP:  1. Oley Balm Sung Amabile, M.D. [facility] as advised.  2. Gordy Savers, M.D. [facility], medical  management.      Danie   DA/MEDQ  D:  05/01/2005  T:  05/01/2005  Job:  [id number]   cc:   Oley Balm. Sung Amabile, M.D. [facility]   Gordy Savers, M.D. [facility]

## 2011-03-21 NOTE — Op Note (Signed)
[name], BALLINAS NO.:  [id number]   MEDICAL RECORD NO.:  [id number]          PATIENT TYPE:  INP   LOCATION:  6706                         FACILITY:  [facility]   PHYSICIAN:  Lucky Cowboy, MD         DATE OF BIRTH:  Mar 21, 1946   DATE OF PROCEDURE:  04/11/2005  DATE OF DISCHARGE:  04/22/2005                                 OPERATIVE REPORT   PREOPERATIVE DIAGNOSIS:  Chronic ventilatory dependency.   POSTOPERATIVE DIAGNOSIS:  Chronic ventilatory dependency.   PROCEDURE:  Tracheotomy.   SURGEON:  Lucky Cowboy, MD   ANESTHESIA:  General endotracheal anesthesia.   ESTIMATED BLOOD LOSS:  None.   COMPLICATIONS:  None.   INDICATIONS:  This patient is a 65 year old female who has been intubated  since Mar 26, 2005.  This has been for pneumococcal pneumonia progressing to  acute respiratory distress syndrome.  The patient has failed spontaneous  breathing trials.  She is requiring tracheotomy.   FINDINGS:  Patient was noted to have normal tracheal anatomy and #6 Shiley  was placed between rings 2 and 3.   PROCEDURE:  The patient was taken to the operating room and placed on the  table in the supine position.  She was then placed under anesthesia through  existing IVs and endotracheal tube.  The neck was extended and prepped with  Betadine and draped in the usual sterile fashion.  A transverse incision was  made approximately 0.5 cm which was located over the cricoid cartilage.  This was made using a #15 blade with subcutaneous tissues being divided and  a lipectomy performed using Bovie cautery.  The strap muscles were divided  vertically in the median raphe.  The thyroid isthmus was elevated off of the  anterior tracheal wall, doubly clamped and tied off using 2-0 silk suture.  The trachea was then entered between rings 2 and 3.  The existing  endotracheal tube was retracted to ring 2 and a #6 cuffed Shiley placed  endotracheally.  The cuff was inflated and breath  sounds were equal with CO2  being returned.  The tracheotomy tube was secured a 4 corners to the skin  using 2-0 silk suture.  The patient was then returned back to the intensive  care unit in stable condition.  There were no complications.     Lucky Cowboy, MD  Electronically Signed    SJ/MEDQ  D:  07/10/2005  T:  07/11/2005  Job:  [id number]   cc:   [facility]

## 2011-03-21 NOTE — Op Note (Signed)
NAMEKEISHA, AMER NO.:  [id number]   MEDICAL RECORD NO.:  [id number]          PATIENT TYPE:  AMB   LOCATION:  ENDO                         FACILITY:  [facility]   PHYSICIAN:  Georgiana Spinner, M.D.    DATE OF BIRTH:  10-May-1946   DATE OF PROCEDURE:  DATE OF DISCHARGE:                                 OPERATIVE REPORT   PROCEDURE:  Upper endoscopy with biopsy.   INDICATIONS FOR PROCEDURE:  GERD.   ANESTHESIA:  __________   DESCRIPTION OF PROCEDURE:  With the patient mildly sedated __________  was  visualized, __________ pulled it open __________.  The endoscope was slowly  withdrawn.  __________.  The endoscope was then pulled back and placed in  retroflexion.  __________ The procedure tolerated the procedure well without  apparent complications.   __________   PLAN:  Await biopsy report.  __________      GMO/MEDQ  D:  01/28/2005  T:  01/28/2005  Job:  [id number]   cc:   Gordy Savers, M.D. [facility]

## 2011-04-03 ENCOUNTER — Encounter: Payer: Self-pay | Admitting: *Deleted

## 2011-04-08 ENCOUNTER — Ambulatory Visit (INDEPENDENT_AMBULATORY_CARE_PROVIDER_SITE_OTHER): Payer: Managed Care, Other (non HMO) | Admitting: Internal Medicine

## 2011-04-08 ENCOUNTER — Encounter: Payer: Self-pay | Admitting: Internal Medicine

## 2011-04-08 DIAGNOSIS — I10 Essential (primary) hypertension: Secondary | ICD-10-CM

## 2011-04-08 DIAGNOSIS — I5033 Acute on chronic diastolic (congestive) heart failure: Secondary | ICD-10-CM

## 2011-04-08 NOTE — Patient Instructions (Signed)
Your physician recommends that you schedule a follow-up appointment in: YEAR WITH DR TAYLOR Your physician recommends that you continue on your current medications as directed. Please refer to the Current Medication list given to you today. 

## 2011-04-08 NOTE — Progress Notes (Signed)
HPI [name] returns today for followup. She is a very pleasant 65 year old woman with a history of diastolic congestive heart failure, COPD, a long history of tobacco abuse, history of pulmonary fibrosis, who was initially referred to me several months ago with diastolic heart failure. The patient also has a sleep disorder breathing and obesity. In the interim, she is improved markedly. She has lost 40 pounds. Her dyspnea is much better. She is no longer requiring oxygen except at night to sleep. She denies chest pain or peripheral edema. She is on a strict low-sodium diet. No Known Allergies   Current Outpatient Prescriptions  Medication Sig Dispense Refill  . acetaminophen (TYLENOL ARTHRITIS PAIN) 650 MG CR tablet Per bottle directions as needed       . amLODipine (NORVASC) 10 MG tablet 1 tablet daily       . budesonide-formoterol (SYMBICORT) 160-4.5 MCG/ACT inhaler 2 puffs first thing in the am and 2 puffs about 12 hours later       . Calcium Carbonate-Vitamin D (CALTRATE 600+D) 600-400 MG-UNIT per tablet 1 tablet daily.        . Cholecalciferol (VITAMIN D) 2000 UNITS CAPS 1 capsule daily.        . citalopram (CELEXA) 40 MG tablet 1 tablet daily       . Etanercept (ENBREL) 25 MG/0.5ML SOLN 1 injection weekly       . levothyroxine (SYNTHROID) 175 MCG tablet 1 tablet every am       . losartan-hydrochlorothiazide (HYZAAR) 100-25 MG per tablet 1 tablet daily.        . naproxen sodium (ANAPROX) 220 MG tablet Take 220 mg by mouth 2 (two) times daily with a meal.        . omeprazole (PRILOSEC) 20 MG capsule 1 tablet 30 to 60 minutes before first meal of the day       . sulfaSALAzine (AZULFIDINE) 500 MG tablet 2 tablets every am       . traMADol (ULTRAM) 50 MG tablet 1 tablet twice daily          Past Medical History  Diagnosis Date  . Psoriatic arthritis   . Rheumatic disease   . Hypertension   . Hypothyroidism   . Barrett esophagus   . Pneumococcal pneumonia   . COPD (chronic  obstructive pulmonary disease)   . Chronic respiratory failure   . Pulmonary fibrosis     ROS:   All systems reviewed and negative except as noted in the HPI.   Past Surgical History  Procedure Date  . Carpal tunnel release   . Cosmetic breast surgery   . Abdominoplasty   . Cholecystectomy   . Left knee arthroscopic   . Left total knee replacement      Family History  Problem Relation Age of Onset  . Coronary artery disease Father   . Breast cancer Mother   . Rheum arthritis Father      History   Social History  . Marital Status: Married    Spouse Name: N/A    Number of Children: N/A  . Years of Education: N/A   Occupational History  . Floral Manager  [name]   Social History Main Topics  . Smoking status: Former Smoker -- 1.5 packs/day for 58.5 years    Types: Cigarettes    Quit date: 03/03/2005  . Smokeless tobacco: Not on file  . Alcohol Use: No  . Drug Use: Not on file  . Sexually Active: Not on file  Other Topics Concern  . Not on file   Social History Narrative  . No narrative on file     BP 145/84  Pulse 78  Resp 16  Ht 4\' 11"  (1.499 m)  Wt 171 lb (77.565 kg)  BMI 34.54 kg/m2  Physical Exam:  Well appearing NAD HEENT: Unremarkable Neck:  No JVD, no thyromegally Lymphatics:  No adenopathy Back:  No CVA tenderness Lungs:  Clear HEART:  Regular rate rhythm, no murmurs, no rubs, no clicks Abd:  positive bowel sounds, no organomegally, no rebound, no guarding Ext:  2 plus pulses, no edema, no cyanosis, no clubbing Skin:  No rashes no nodules Neuro:  CN II through XII intact, motor grossly intact  Assess/Plan:

## 2011-04-08 NOTE — Assessment & Plan Note (Signed)
Her symptoms are much improved. The combination of weight loss, regular exercise, and a low sodium diet along with her medications have markedly improved her symptoms.

## 2011-04-08 NOTE — Assessment & Plan Note (Signed)
Her blood pressure is elevated slightly today. Her husband who is with her notes that she checks it daily and his systolic pressures have been typically under 125 mm mercury. I recommend she continue her current medications maintain a low-sodium diet. With additional weight loss, her blood pressure will likely only improved.

## 2011-04-29 ENCOUNTER — Telehealth: Payer: Self-pay | Admitting: Internal Medicine

## 2011-04-29 DIAGNOSIS — J8410 Pulmonary fibrosis, unspecified: Secondary | ICD-10-CM

## 2011-04-29 NOTE — Telephone Encounter (Signed)
Spoke with pt. She states doing great, has lost 40 lbs and so therefore ready to have ONO on RA to see if she can d/c o2. Is this okay to order? Please advise thanks!

## 2011-04-29 NOTE — Telephone Encounter (Signed)
Spoke with pt and she is aware ONO order was sent to [facility].

## 2011-04-29 NOTE — Telephone Encounter (Signed)
Ok to do [employer] ra

## 2011-04-29 NOTE — Telephone Encounter (Signed)
ONO on RA order was sent to [facility]. LMOMTCB.

## 2011-05-05 ENCOUNTER — Telehealth: Payer: Self-pay | Admitting: Internal Medicine

## 2011-05-05 DIAGNOSIS — R0902 Hypoxemia: Secondary | ICD-10-CM

## 2011-05-05 NOTE — Telephone Encounter (Signed)
lmomtcb x1 

## 2011-05-06 NOTE — Telephone Encounter (Signed)
Have not seen them yet, check with Libby/Rhonda

## 2011-05-06 NOTE — Telephone Encounter (Signed)
Will call [facility] and have them refax results to triage.

## 2011-05-06 NOTE — Telephone Encounter (Signed)
Results placed in MW lookat for review. Please advise, thanks!

## 2011-05-06 NOTE — Telephone Encounter (Signed)
Spoke with pt. She is requesting results of ONO- pls advise, thanks!

## 2011-05-08 ENCOUNTER — Telehealth: Payer: Self-pay | Admitting: Internal Medicine

## 2011-05-08 ENCOUNTER — Other Ambulatory Visit: Payer: Self-pay

## 2011-05-08 MED ORDER — CITALOPRAM HYDROBROMIDE 40 MG PO TABS: 40.0000 mg | ORAL_TABLET | Freq: Every day | ORAL | Status: DC

## 2011-05-08 MED ORDER — OMEPRAZOLE 20 MG PO CPDR: 20.0000 mg | DELAYED_RELEASE_CAPSULE | Freq: Every day | ORAL | Status: DC

## 2011-05-08 MED ORDER — AMLODIPINE BESYLATE 10 MG PO TABS: 10.0000 mg | ORAL_TABLET | Freq: Every day | ORAL | Status: DC

## 2011-05-08 NOTE — Telephone Encounter (Signed)
Duplicate msg.

## 2011-05-08 NOTE — Telephone Encounter (Signed)
Faxed back refill request to [facility] - last seen 07/2010 - gave 6 mos RF - will need to be seen

## 2011-05-08 NOTE — Telephone Encounter (Signed)
Per MW- ONO showed that she does need o2 at 2lpm at hs. Needs repeat ONO on 2lpm. Spoke with pt and notified of results/recs and she verbalized understanding. Order sent to [facility] for ONO on 2lpm.

## 2011-05-14 ENCOUNTER — Telehealth: Payer: Self-pay | Admitting: Internal Medicine

## 2011-05-14 NOTE — Telephone Encounter (Signed)
Spoke with pt's spouse and notified that he can tell the pt that once her results are scanned in the system, Dr Kirtland Bouchard can see these since we all share the same chart. He verbalized understanding and states will inform the pt of this.

## 2011-05-20 ENCOUNTER — Telehealth: Payer: Self-pay | Admitting: Internal Medicine

## 2011-05-20 NOTE — Telephone Encounter (Signed)
ONO on 2 lpm looks good and pt should continue. ATC pt and advise, NA and no option to leave a msg, WCB.

## 2011-05-22 ENCOUNTER — Encounter: Payer: Self-pay | Admitting: Internal Medicine

## 2011-05-27 ENCOUNTER — Telehealth: Payer: Self-pay | Admitting: Internal Medicine

## 2011-05-27 DIAGNOSIS — J8410 Pulmonary fibrosis, unspecified: Secondary | ICD-10-CM

## 2011-05-27 NOTE — Telephone Encounter (Signed)
Spoke with pt. She states that she is needing to have her o2 tanks picked up. Only using o2 at night now, so only needs concentrator. Please advise if okay to send order to pick up tanks, thanks!

## 2011-05-27 NOTE — Telephone Encounter (Signed)
Order sent to [facility] and pt aware  

## 2011-05-27 NOTE — Telephone Encounter (Signed)
Ok to stop 02 daytime and just use conc at  Bedtime >  needs to continue this nightly and have ono RA before considering d/c this

## 2011-05-27 NOTE — Telephone Encounter (Signed)
Pt called back to add the following # where she could be reached (in addition to the # given previously) [phone]. [name]

## 2011-06-02 ENCOUNTER — Encounter: Payer: Self-pay | Admitting: Internal Medicine

## 2011-07-24 ENCOUNTER — Other Ambulatory Visit (INDEPENDENT_AMBULATORY_CARE_PROVIDER_SITE_OTHER): Payer: Managed Care, Other (non HMO)

## 2011-07-24 DIAGNOSIS — Z0000 Encounter for general adult medical examination without abnormal findings: Secondary | ICD-10-CM

## 2011-07-24 LAB — CBC WITH DIFFERENTIAL/PLATELET
Basophils Absolute: 0.1 10*3/uL (ref 0.0–0.1)
Basophils Relative: 0.8 % (ref 0.0–3.0)
Eosinophils Absolute: 0.3 10*3/uL (ref 0.0–0.7)
Eosinophils Relative: 4.8 % (ref 0.0–5.0)
HCT: 41.7 % (ref 36.0–46.0)
Hemoglobin: 13.6 g/dL (ref 12.0–15.0)
Lymphocytes Relative: 28.5 % (ref 12.0–46.0)
Lymphs Abs: 1.9 10*3/uL (ref 0.7–4.0)
MCHC: 32.6 g/dL (ref 30.0–36.0)
MCV: 96.4 fl (ref 78.0–100.0)
Monocytes Absolute: 0.7 10*3/uL (ref 0.1–1.0)
Monocytes Relative: 11.3 % (ref 3.0–12.0)
Neutro Abs: 3.6 10*3/uL (ref 1.4–7.7)
Neutrophils Relative %: 54.6 % (ref 43.0–77.0)
Platelets: 223 10*3/uL (ref 150.0–400.0)
RBC: 4.32 Mil/uL (ref 3.87–5.11)
RDW: 14.1 % (ref 11.5–14.6)
WBC: 6.6 10*3/uL (ref 4.5–10.5)

## 2011-07-24 LAB — LIPID PANEL
Cholesterol: 196 mg/dL (ref 0–200)
HDL: 55.2 mg/dL (ref >39.00)
LDL Cholesterol: 121 mg/dL — ABNORMAL HIGH (ref 0–99)
Total CHOL/HDL Ratio: 4
Triglycerides: 101 mg/dL (ref 0.0–149.0)
VLDL: 20.2 mg/dL (ref 0.0–40.0)

## 2011-07-24 LAB — BASIC METABOLIC PANEL
BUN: 16 mg/dL (ref 6–23)
CO2: 34 mEq/L — ABNORMAL HIGH (ref 19–32)
Calcium: 9.8 mg/dL (ref 8.4–10.5)
Chloride: 99 mEq/L (ref 96–112)
Creatinine, Ser: 0.8 mg/dL (ref 0.4–1.2)
GFR: 77.78 mL/min (ref >60.00)
Glucose, Bld: 84 mg/dL (ref 70–99)
Potassium: 4 mEq/L (ref 3.5–5.1)
Sodium: 141 mEq/L (ref 135–145)

## 2011-07-24 LAB — POCT URINALYSIS DIPSTICK
Bilirubin, UA: NEGATIVE
Glucose, UA: NEGATIVE
Ketones, UA: NEGATIVE
Leukocytes, UA: NEGATIVE
Nitrite, UA: NEGATIVE
Spec Grav, UA: 1.025
Urobilinogen, UA: 0.2
pH, UA: 5.5

## 2011-07-24 LAB — HEPATIC FUNCTION PANEL
ALT: 27 U/L (ref 0–35)
AST: 34 U/L (ref 0–37)
Albumin: 3.9 g/dL (ref 3.5–5.2)
Alkaline Phosphatase: 57 U/L (ref 39–117)
Bilirubin, Direct: 0.1 mg/dL (ref 0.0–0.3)
Total Bilirubin: 0.4 mg/dL (ref 0.3–1.2)
Total Protein: 7.4 g/dL (ref 6.0–8.3)

## 2011-07-24 LAB — TSH: TSH: 16.12 u[IU]/mL — ABNORMAL HIGH (ref 0.35–5.50)

## 2011-08-12 ENCOUNTER — Encounter: Payer: Self-pay | Admitting: Internal Medicine

## 2011-08-12 ENCOUNTER — Ambulatory Visit (INDEPENDENT_AMBULATORY_CARE_PROVIDER_SITE_OTHER): Payer: Managed Care, Other (non HMO) | Admitting: Internal Medicine

## 2011-08-12 VITALS — BP 120/80 | HR 80 | Temp 98.0°F | Resp 16 | Ht 60.0 in | Wt 178.0 lb

## 2011-08-12 DIAGNOSIS — Z23 Encounter for immunization: Secondary | ICD-10-CM

## 2011-08-12 DIAGNOSIS — Z0000 Encounter for general adult medical examination without abnormal findings: Secondary | ICD-10-CM

## 2011-08-12 MED ORDER — LOSARTAN POTASSIUM-HCTZ 100-25 MG PO TABS: 1.0000 | ORAL_TABLET | Freq: Every day | ORAL | Status: DC

## 2011-08-12 MED ORDER — LEVOTHYROXINE SODIUM 200 MCG PO TABS: 200.0000 ug | ORAL_TABLET | Freq: Every day | ORAL | Status: DC

## 2011-08-12 MED ORDER — BUDESONIDE-FORMOTEROL FUMARATE 160-4.5 MCG/ACT IN AERO: 2.0000 | INHALATION_SPRAY | Freq: Two times a day (BID) | RESPIRATORY_TRACT | Status: DC

## 2011-08-12 MED ORDER — CITALOPRAM HYDROBROMIDE 40 MG PO TABS: 40.0000 mg | ORAL_TABLET | Freq: Every day | ORAL | Status: DC

## 2011-08-12 MED ORDER — AMLODIPINE BESYLATE 10 MG PO TABS: 10.0000 mg | ORAL_TABLET | Freq: Every day | ORAL | Status: DC

## 2011-08-12 MED ORDER — TRAMADOL HCL 50 MG PO TABS: 50.0000 mg | ORAL_TABLET | Freq: Four times a day (QID) | ORAL | Status: DC | PRN

## 2011-08-12 MED ORDER — OMEPRAZOLE 20 MG PO CPDR: 20.0000 mg | DELAYED_RELEASE_CAPSULE | Freq: Every day | ORAL | Status: DC

## 2011-08-12 NOTE — Patient Instructions (Signed)
Limit your sodium (Salt) intake    It is important that you exercise regularly, at least 20 minutes 3 to 4 times per week.  If you develop chest pain or shortness of breath seek  medical attention.  You need to lose weight.  Consider a lower calorie diet and regular exercise.  Please check your blood pressure on a regular basis.  If it is consistently greater than 150/90, please make an office appointment.  

## 2011-08-12 NOTE — Progress Notes (Signed)
Subjective:    Patient ID: [name], female    DOB: 1946/06/27, 65 y.o.   MRN: [id number]  HPI   Wt Readings from Last 3 Encounters:  08/12/11 178 lb (80.74 kg)  04/08/11 171 lb (77.565 kg)  02/25/11 179 lb (81.194 kg)   History of Present Illness:  65year-old patient who is seen today for a health maintenance exam. She has been followed closely by pulmonary. 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 She has done quite well and is now on nocturnal O2 as well as with vigorous physical activities. She feels quite well and has returned to work. She has treated hypertension and hypothyroidism;  she is making attempts at weight loss hopefully to a lemonade her nocturnal oxygen requirements. She is doing quite well since her last exam there has been some modest weight loss. She is followed closely by rheumatology.  Allergies (verified):  No Known Drug Allergies  Past History:  Past Medical History:  Psoriatic and rheumatoid arthritis  Hypertension  - Try off ACE July 18, 2010  Hypothyroidism  Barrett's esophagus  history of pneumococcal pneumonia and ARDS 2006  COPD  - PFT's rec July 18, 2010  - HFA 50% p coaching July 18, 2010  Chronic Respiratory Failure  - 02 dependent 24 h per day since 07/02/10  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 Pna  - CT chest 07/03/10 Nonspecific PF mostly upper lobes  history of mild vitamin D deficiency   Past Surgical History:  Reviewed history from 07/02/2010 and no changes required.  Carpal tunnel release  Cosmetic breast surgery  Abdominoplasty  Cholecystectomy  left knee arthroscopic surgery December 2007  left totally replacement, July 2008  gravida two, para two, aborta zero  negative ETT 2005  2-D echocardiogram 2006  PFT's 6-11: mild obstructive defect; moderate decrease in diffusing capacity; no response to bronchodilators.  Normal. Lung findings  Chest CT April 2011- bilateral pulmonary infiltrates; coronary artery calcification   Family History:  Reviewed history from 07/18/2010 and no changes required.  father died at age 74 of coronary artery disease.  mother died at 27 of breast cancer.  One sister, status post a gastric bypass  RA- Father   Social History:  Reviewed history from 07/18/2010 and no changes required.  Married  Children  Former smoker. Quit in May 2006. Smoked for approx 39 yrs up to 1 1/2 ppd  No ETOH  Works in the [employer] at [employer]    Review of Systems  Constitutional: Negative for fever, appetite change, fatigue and unexpected weight change.  HENT: Negative for hearing loss, ear pain, nosebleeds, congestion, sore throat, mouth sores, trouble swallowing, neck stiffness, dental problem, voice change, sinus pressure and tinnitus.   Eyes: Negative for photophobia, pain, redness and visual disturbance.  Respiratory: Negative for cough, chest tightness and shortness of breath.   Cardiovascular: Negative for chest pain, palpitations and leg swelling.  Gastrointestinal: Negative for nausea, vomiting, abdominal pain, diarrhea, constipation, blood in stool, abdominal distention and rectal pain.  Genitourinary: Negative for dysuria, urgency, frequency, hematuria, flank pain, vaginal bleeding, vaginal discharge, difficulty urinating, genital sores, vaginal pain, menstrual problem and pelvic pain.  Musculoskeletal: Negative for back pain and arthralgias.  Skin: Negative for rash.  Neurological: Negative for dizziness, syncope, speech difficulty, weakness, light-headedness, numbness and headaches.  Hematological: Negative for adenopathy. Does not bruise/bleed easily.  Psychiatric/Behavioral: Negative for suicidal  ideas, behavioral problems, self-injury, dysphoric mood and agitation. The patient is not nervous/anxious.        Objective:   Physical Exam    Constitutional: She is oriented to person, place, and time. She appears well-developed and well-nourished.  HENT:  Head: Normocephalic and atraumatic.  Right Ear: External ear normal.  Left Ear: External ear normal.  Mouth/Throat: Oropharynx is clear and moist.  Eyes: Conjunctivae and EOM are normal.  Neck: Normal range of motion. Neck supple. No JVD present. No thyromegaly present.  Cardiovascular: Normal rate, regular rhythm, normal heart sounds and intact distal pulses.   No murmur heard. Pulmonary/Chest: Effort normal and breath sounds normal. She has no wheezes. She has no rales.  Abdominal: Soft. Bowel sounds are normal. She exhibits no distension and no mass. There is no tenderness. There is no rebound and no guarding.  Musculoskeletal: Normal range of motion. She exhibits no edema and no tenderness.  Neurological: She is alert and oriented to person, place, and time. She has normal reflexes. No cranial nerve deficit. She exhibits normal muscle tone. Coordination normal.  Skin: Skin is warm and dry. No rash noted.  Psychiatric: She has a normal mood and affect. Her behavior is normal.          Assessment & Plan:    Preventive health examination Hypothyroidism. Her TSH is slightly elevated she has been very compliant with her Synthroid supplementation we'll increase her dose to 0.2 mg daily Hypertension well controlled Rheumatoid arthritis COPD and low-grade interstitial fibrosis. Followup pulmonary  Exercise weight loss encouraged. Return here in one year or as needed

## 2011-08-18 LAB — TYPE AND SCREEN
ABO/RH(D): O POS
Antibody Screen: NEGATIVE

## 2011-08-18 LAB — BASIC METABOLIC PANEL
BUN: 12
BUN: 4 — ABNORMAL LOW
BUN: 5 — ABNORMAL LOW
BUN: 8
BUN: 9
CO2: 29
CO2: 29
CO2: 31
CO2: 32
CO2: 32
Calcium: 8.6
Calcium: 8.9
Calcium: 9
Calcium: 9
Calcium: 9
Chloride: 105
Chloride: 89 — ABNORMAL LOW
Chloride: 91 — ABNORMAL LOW
Chloride: 92 — ABNORMAL LOW
Chloride: 96
Creatinine, Ser: 0.61
Creatinine, Ser: 0.61
Creatinine, Ser: 0.73
Creatinine, Ser: 0.79
Creatinine, Ser: 0.89
GFR calc Af Amer: >60
GFR calc Af Amer: >60
GFR calc Af Amer: >60
GFR calc Af Amer: >60
GFR calc Af Amer: >60
GFR calc non Af Amer: >60
GFR calc non Af Amer: >60
GFR calc non Af Amer: >60
GFR calc non Af Amer: >60
GFR calc non Af Amer: >60
Glucose, Bld: 114 — ABNORMAL HIGH
Glucose, Bld: 116 — ABNORMAL HIGH
Glucose, Bld: 132 — ABNORMAL HIGH
Glucose, Bld: 143 — ABNORMAL HIGH
Glucose, Bld: 147 — ABNORMAL HIGH
Potassium: 3.3 — ABNORMAL LOW
Potassium: 3.3 — ABNORMAL LOW
Potassium: 3.7
Potassium: 3.9
Potassium: 4.3
Sodium: 127 — ABNORMAL LOW
Sodium: 127 — ABNORMAL LOW
Sodium: 130 — ABNORMAL LOW
Sodium: 136
Sodium: 141

## 2011-08-18 LAB — COMPREHENSIVE METABOLIC PANEL
ALT: 25
AST: 32
Albumin: 3.8
Alkaline Phosphatase: 69
BUN: 24 — ABNORMAL HIGH
CO2: 30
Calcium: 10
Chloride: 101
Creatinine, Ser: 0.87
GFR calc Af Amer: >60
GFR calc non Af Amer: >60
Glucose, Bld: 94
Potassium: 4.3
Sodium: 140
Total Bilirubin: 0.8
Total Protein: 6.9

## 2011-08-18 LAB — URINALYSIS, ROUTINE W REFLEX MICROSCOPIC
Glucose, UA: NEGATIVE
Hgb urine dipstick: NEGATIVE
Ketones, ur: NEGATIVE
Leukocytes, UA: NEGATIVE
Nitrite: NEGATIVE
Protein, ur: 100 — AB
Specific Gravity, Urine: 1.027
Urobilinogen, UA: 0.2
pH: 5.5

## 2011-08-18 LAB — CBC
HCT: 28.3 — ABNORMAL LOW
HCT: 28.5 — ABNORMAL LOW
HCT: 36.9
Hemoglobin: 9.8 — ABNORMAL LOW
Hemoglobin: 9.8 — ABNORMAL LOW
Hemoglobin: 12.5
MCHC: 34.5
MCHC: 34.7
MCHC: 33.8
MCV: 84.1
MCV: 84.4
MCV: 84.8
Platelets: 155
Platelets: 189
Platelets: 234
RBC: 3.37 — ABNORMAL LOW
RBC: 3.38 — ABNORMAL LOW
RBC: 4.35
RDW: 14.2 — ABNORMAL HIGH
RDW: 14.2 — ABNORMAL HIGH
RDW: 14.2 — ABNORMAL HIGH
WBC: 6.5
WBC: 9.8
WBC: 6.8

## 2011-08-18 LAB — URINE MICROSCOPIC-ADD ON

## 2011-08-18 LAB — PROTIME-INR
INR: 0.9
Prothrombin Time: 12.4

## 2011-08-18 LAB — ABO/RH: ABO/RH(D): O POS

## 2011-08-18 LAB — APTT: aPTT: 30

## 2011-08-29 ENCOUNTER — Telehealth: Payer: Self-pay | Admitting: Internal Medicine

## 2011-08-29 NOTE — Telephone Encounter (Signed)
Pt calling to let us know that her PCP sent in refills for her Symbicort and wanted to make sure this was okay. I told her that this was fine and she would lneed to follow-up with Dr. Sherene Sires as directed.

## 2011-10-06 ENCOUNTER — Telehealth: Payer: Self-pay

## 2011-10-06 MED ORDER — TRAMADOL HCL 50 MG PO TABS: 50.0000 mg | ORAL_TABLET | Freq: Four times a day (QID) | ORAL | Status: DC | PRN

## 2011-10-06 NOTE — Telephone Encounter (Signed)
ok 

## 2011-10-06 NOTE — Telephone Encounter (Signed)
Attempt to call hm# - she is at work - told husband change to med # sent to [facility]

## 2011-10-06 NOTE — Telephone Encounter (Signed)
Please advise - last seen 08/12/11 , tramadol rx's #90 with refills

## 2011-10-06 NOTE — Telephone Encounter (Signed)
Pt states when she was in for her cpx her medications were sent and Tramadol was sent incorrectly.  Pt states she usually gets take 1 tablet 3 to 4 times daily #360 because she gets a 3 month supply.

## 2011-10-14 ENCOUNTER — Other Ambulatory Visit: Payer: Self-pay

## 2011-10-14 MED ORDER — TRAMADOL HCL 50 MG PO TABS: 50.0000 mg | ORAL_TABLET | Freq: Four times a day (QID) | ORAL | Status: DC | PRN

## 2011-10-21 ENCOUNTER — Other Ambulatory Visit: Payer: Self-pay

## 2011-10-21 MED ORDER — SULFASALAZINE 500 MG PO TABS: 1000.0000 mg | ORAL_TABLET | ORAL | Status: DC

## 2011-10-21 NOTE — Telephone Encounter (Signed)
Denied not on current med list.

## 2012-01-27 ENCOUNTER — Encounter: Payer: Self-pay | Admitting: Internal Medicine

## 2012-01-27 LAB — HM MAMMOGRAPHY

## 2012-01-29 ENCOUNTER — Other Ambulatory Visit: Payer: Self-pay | Admitting: *Deleted

## 2012-01-29 MED ORDER — CITALOPRAM HYDROBROMIDE 40 MG PO TABS: 40.0000 mg | ORAL_TABLET | Freq: Every day | ORAL | Status: DC

## 2012-01-29 MED ORDER — AMLODIPINE BESYLATE 10 MG PO TABS: 10.0000 mg | ORAL_TABLET | Freq: Every day | ORAL | Status: DC

## 2012-01-29 MED ORDER — OMEPRAZOLE 20 MG PO CPDR: 20.0000 mg | DELAYED_RELEASE_CAPSULE | Freq: Every day | ORAL | Status: DC

## 2012-02-03 ENCOUNTER — Encounter: Payer: Self-pay | Admitting: Internal Medicine

## 2012-04-27 ENCOUNTER — Other Ambulatory Visit: Payer: Self-pay

## 2012-04-27 ENCOUNTER — Telehealth: Payer: Self-pay | Admitting: Internal Medicine

## 2012-04-27 MED ORDER — BUDESONIDE-FORMOTEROL FUMARATE 160-4.5 MCG/ACT IN AERO: 2.0000 | INHALATION_SPRAY | Freq: Two times a day (BID) | RESPIRATORY_TRACT | Status: DC

## 2012-04-27 NOTE — Telephone Encounter (Signed)
Pt last seen 02/25/2011. I advised pt will refill but needs OV for further refills. Pt states her pcp has been refilling this for her and will call there office for this refill instead bc pt did not want to schedule OV

## 2012-07-06 ENCOUNTER — Other Ambulatory Visit: Payer: Self-pay

## 2012-07-06 MED ORDER — OMEPRAZOLE 20 MG PO CPDR: 20.0000 mg | DELAYED_RELEASE_CAPSULE | Freq: Every day | ORAL | Status: DC

## 2012-07-06 MED ORDER — CITALOPRAM HYDROBROMIDE 40 MG PO TABS: 40.0000 mg | ORAL_TABLET | Freq: Every day | ORAL | Status: DC

## 2012-07-08 ENCOUNTER — Other Ambulatory Visit: Payer: Self-pay

## 2012-07-08 MED ORDER — AMLODIPINE BESYLATE 10 MG PO TABS: 10.0000 mg | ORAL_TABLET | Freq: Every day | ORAL | Status: DC

## 2012-08-03 ENCOUNTER — Telehealth: Payer: Self-pay | Admitting: Internal Medicine

## 2012-08-03 DIAGNOSIS — J8410 Pulmonary fibrosis, unspecified: Secondary | ICD-10-CM

## 2012-08-03 NOTE — Telephone Encounter (Signed)
Needs ono RA repeated before ok to d/c 02 at hs - will need ov to advise on other aspects  of 02 rx (rest and walking) - ok to f/u with me or Tammy NP

## 2012-08-03 NOTE — Telephone Encounter (Signed)
Order placed. Pt scheduled OV with TP

## 2012-08-03 NOTE — Telephone Encounter (Signed)
Pt returned call. She is at work now and asks that nurse call [phone]- then press prompt 4 then prompt 1. [name]

## 2012-08-03 NOTE — Telephone Encounter (Signed)
MW, please advise.   Hypoxemia - Sandrea Hughs, MD 02/25/2011 1:54 PM Signed  Def needs the 02 at hs and with activity when limited by sob but not necessarily at rest.  If wants to stop completely with further wt loss will need ono on RA  Pt has not been seen since 02-2011. Do you want her to F/U with you for this matter or is it okay to see TP? Thanks.

## 2012-08-03 NOTE — Telephone Encounter (Signed)
LMOMTCB x 1 

## 2012-08-10 ENCOUNTER — Encounter: Payer: Self-pay | Admitting: Adult Health

## 2012-08-10 ENCOUNTER — Ambulatory Visit (INDEPENDENT_AMBULATORY_CARE_PROVIDER_SITE_OTHER): Payer: Managed Care, Other (non HMO) | Admitting: Adult Health

## 2012-08-10 ENCOUNTER — Other Ambulatory Visit (INDEPENDENT_AMBULATORY_CARE_PROVIDER_SITE_OTHER): Payer: Managed Care, Other (non HMO)

## 2012-08-10 VITALS — BP 152/92 | HR 83 | Temp 98.8°F | Ht 59.0 in | Wt 189.2 lb

## 2012-08-10 DIAGNOSIS — R0902 Hypoxemia: Secondary | ICD-10-CM

## 2012-08-10 DIAGNOSIS — Z0000 Encounter for general adult medical examination without abnormal findings: Secondary | ICD-10-CM

## 2012-08-10 DIAGNOSIS — J8410 Pulmonary fibrosis, unspecified: Secondary | ICD-10-CM

## 2012-08-10 LAB — LIPID PANEL
Cholesterol: 206 mg/dL — ABNORMAL HIGH (ref 0–200)
HDL: 54.9 mg/dL (ref >39.00)
Total CHOL/HDL Ratio: 4
Triglycerides: 96 mg/dL (ref 0.0–149.0)
VLDL: 19.2 mg/dL (ref 0.0–40.0)

## 2012-08-10 LAB — CBC WITH DIFFERENTIAL/PLATELET
Basophils Absolute: 0.1 10*3/uL (ref 0.0–0.1)
Basophils Relative: 0.9 % (ref 0.0–3.0)
Eosinophils Absolute: 0.3 10*3/uL (ref 0.0–0.7)
Eosinophils Relative: 4.9 % (ref 0.0–5.0)
HCT: 44 % (ref 36.0–46.0)
Hemoglobin: 14.3 g/dL (ref 12.0–15.0)
Lymphocytes Relative: 21.2 % (ref 12.0–46.0)
Lymphs Abs: 1.4 10*3/uL (ref 0.7–4.0)
MCHC: 32.4 g/dL (ref 30.0–36.0)
MCV: 94.4 fl (ref 78.0–100.0)
Monocytes Absolute: 0.7 10*3/uL (ref 0.1–1.0)
Monocytes Relative: 10 % (ref 3.0–12.0)
Neutro Abs: 4.2 10*3/uL (ref 1.4–7.7)
Neutrophils Relative %: 63 % (ref 43.0–77.0)
Platelets: 207 10*3/uL (ref 150.0–400.0)
RBC: 4.66 Mil/uL (ref 3.87–5.11)
RDW: 14.7 % — ABNORMAL HIGH (ref 11.5–14.6)
WBC: 6.7 10*3/uL (ref 4.5–10.5)

## 2012-08-10 LAB — LDL CHOLESTEROL, DIRECT: Direct LDL: 131.1 mg/dL

## 2012-08-10 LAB — BASIC METABOLIC PANEL
BUN: 21 mg/dL (ref 6–23)
CO2: 30 mEq/L (ref 19–32)
Calcium: 9.8 mg/dL (ref 8.4–10.5)
Chloride: 101 mEq/L (ref 96–112)
Creatinine, Ser: 0.8 mg/dL (ref 0.4–1.2)
GFR: 76.41 mL/min (ref >60.00)
Glucose, Bld: 96 mg/dL (ref 70–99)
Potassium: 4.1 mEq/L (ref 3.5–5.1)
Sodium: 138 mEq/L (ref 135–145)

## 2012-08-10 LAB — HEPATIC FUNCTION PANEL
ALT: 23 U/L (ref 0–35)
AST: 26 U/L (ref 0–37)
Albumin: 3.9 g/dL (ref 3.5–5.2)
Alkaline Phosphatase: 52 U/L (ref 39–117)
Bilirubin, Direct: 0.1 mg/dL (ref 0.0–0.3)
Total Bilirubin: 0.7 mg/dL (ref 0.3–1.2)
Total Protein: 7.9 g/dL (ref 6.0–8.3)

## 2012-08-10 LAB — POCT URINALYSIS DIPSTICK
Bilirubin, UA: NEGATIVE
Blood, UA: NEGATIVE
Glucose, UA: NEGATIVE
Ketones, UA: NEGATIVE
Leukocytes, UA: NEGATIVE
Nitrite, UA: NEGATIVE
Protein, UA: NEGATIVE
Spec Grav, UA: 1.015
Urobilinogen, UA: 0.2
pH, UA: 7

## 2012-08-10 LAB — TSH: TSH: 7.39 u[IU]/mL — ABNORMAL HIGH (ref 0.35–5.50)

## 2012-08-10 NOTE — Progress Notes (Signed)
Subjective:    Patient ID: [name], female    DOB: 1946-09-07, 66 y.o.   MRN: [id number]  HPI   Subjective:    Patient ID: [name], female    DOB: December 08, 1945, 66 y.o.   MRN: [id number]  HPI 35 yowf quit smoking in May 2006 pna/ ards resumed full activity including yardwork at wt 140 and pft's c/w restrictive changes 04/2010   July 18, 2010  1st pulmonary office eval in ER era c/o doe x 6 months progressive indolent onset with copd vs pf.  Ultimately required hosp 8/30-9/1 dx copd/pf ? cause on sulfasalzine and ACE inhib better on 02 and advair but very hoarse with sense of chest congestion and cough with white mucus day > night.  doe x > slow adls.stop advair Start symbicort 160 2 puffs first thing  in am and 2 puffs again in pm about 12 hours later  Work on inhaler technique:   stop benazapril start benicar 40/25  one daily Use 02 sleeping and any activity other than sitting still  August 20, 2010 ov Dyspnea- much better, not limiting, no cough.Try stopping the night time dose of symbicort first and then the am dose if doing well  Remain on 02 at bedtime for now at 2lpm and just use it during the day with exertion to help lose weight your lungs are actually in pretty good shape and pulmonary follow up can be as needed > did not follow recs for using 02 with ex to help loose wt  December 05, 2010 ov p exac of chf 1/31 seen in er  with HBP and bnp 222  and now 02 dep 24 hours per day,  no cough, only walking maybe 100 ft.  baseline = sob RA same distance she now struggles to do on 02 but definitely better since back on 02 and hbp rx. Rec no change rx  01/21/2011 ov cc cough 3 x a day minimal mucoid sputum, not taking symbicort even  one twice daily and no noct cough.  DOE better with intentional wt loss.  rec work on hfa technique and only use 02 hs and daytime as needed  02/25/2011 ov/Wert cc sob better with intentional wt loss to point where often not using 02 with activity  and only using symbicort one bid  - no cough or noct c/os  08/10/12 Follow up  Returns for follow up  Wants to discuss getting rid of O2.  Had ONO last week, results indicate +desats that require O2 support.  We discussed this in detail .  Walking today in office with notable desats with O2 , She was instructed to wear with  Walking as well.  She is in aggreement and admits she get short of breath with walking. And has to stop and rest.  No increased cough. No edema or fever.      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       - CT chest 07/03/10 Nonspecific PF mostly upper lobes       - CT chest 12/03/10 acute gg changes and effusions c/w chf        -  PFT's  04/12/10 FEV1  1.21 (69%) ratio 77 and no change p B2,  DLC0 56% Asthmatic bronchitis     - HFA 50% p coaching July 18, 2010 >>  90%  02/25/2011  history of mild vitamin D deficiency        Objective:   Physical Exam    amb slt hoarse mod obese wf nad all smiles    wt 192 July 18, 2010 > 197 August 21, 2010 > 207 December 05, 2010  > 183  01/21/2011  > 179 02/25/2011 >189 08/10/2012  HEENT mild turbinate edema.  Oropharynx no thrush or excess pnd or cobblestoning.  No JVD or cervical adenopathy. Mild accessory muscle hypertrophy. Trachea midline, nl thryroid.  Trace crackles on inps R > L base.. . Regular rate and rhythm without murmur gallop or rub or increase P2 or edema.  Abd: no hsm, nl excursion. Ext warm without cyanosis or clubbing.

## 2012-08-10 NOTE — Patient Instructions (Addendum)
Restart Oxygen 2 l/m At bedtime  , and with activity As needed   follow up in 4 weeks with chest xray and spirometry

## 2012-08-12 ENCOUNTER — Encounter: Payer: Self-pay | Admitting: Internal Medicine

## 2012-08-17 ENCOUNTER — Encounter: Payer: Self-pay | Admitting: Internal Medicine

## 2012-08-17 ENCOUNTER — Ambulatory Visit (INDEPENDENT_AMBULATORY_CARE_PROVIDER_SITE_OTHER): Payer: Managed Care, Other (non HMO) | Admitting: Internal Medicine

## 2012-08-17 VITALS — BP 120/74 | HR 78 | Temp 98.1°F | Resp 20 | Ht 60.0 in | Wt 188.0 lb

## 2012-08-17 DIAGNOSIS — J8410 Pulmonary fibrosis, unspecified: Secondary | ICD-10-CM

## 2012-08-17 DIAGNOSIS — I10 Essential (primary) hypertension: Secondary | ICD-10-CM

## 2012-08-17 DIAGNOSIS — Z0000 Encounter for general adult medical examination without abnormal findings: Secondary | ICD-10-CM

## 2012-08-17 DIAGNOSIS — J449 Chronic obstructive pulmonary disease, unspecified: Secondary | ICD-10-CM

## 2012-08-17 MED ORDER — ALBUTEROL SULFATE (2.5 MG/3ML) 0.083% IN NEBU: 2.5000 mg | INHALATION_SOLUTION | Freq: Four times a day (QID) | RESPIRATORY_TRACT | Status: DC | PRN

## 2012-08-17 MED ORDER — AMLODIPINE BESYLATE 10 MG PO TABS: 10.0000 mg | ORAL_TABLET | Freq: Every day | ORAL | Status: DC

## 2012-08-17 MED ORDER — OMEPRAZOLE 20 MG PO CPDR: 20.0000 mg | DELAYED_RELEASE_CAPSULE | Freq: Every day | ORAL | Status: DC

## 2012-08-17 MED ORDER — LEVOTHYROXINE SODIUM 200 MCG PO TABS: 200.0000 ug | ORAL_TABLET | Freq: Every day | ORAL | Status: DC

## 2012-08-17 MED ORDER — TRAMADOL HCL 50 MG PO TABS: 50.0000 mg | ORAL_TABLET | Freq: Four times a day (QID) | ORAL | Status: DC | PRN

## 2012-08-17 MED ORDER — LOSARTAN POTASSIUM-HCTZ 100-25 MG PO TABS: 1.0000 | ORAL_TABLET | Freq: Every day | ORAL | Status: DC

## 2012-08-17 MED ORDER — BUDESONIDE-FORMOTEROL FUMARATE 160-4.5 MCG/ACT IN AERO: 2.0000 | INHALATION_SPRAY | Freq: Two times a day (BID) | RESPIRATORY_TRACT | Status: DC

## 2012-08-17 MED ORDER — CITALOPRAM HYDROBROMIDE 40 MG PO TABS: 40.0000 mg | ORAL_TABLET | Freq: Every day | ORAL | Status: DC

## 2012-08-17 NOTE — Patient Instructions (Signed)
Limit your sodium (Salt) intake  You need to lose weight.  Consider a lower calorie diet and regular exercise.    It is important that you exercise regularly, at least 20 minutes 3 to 4 times per week.  If you develop chest pain or shortness of breath seek  medical attention.  Pulmonary followup as scheduled  Return in one year for follow-up

## 2012-08-17 NOTE — Assessment & Plan Note (Signed)
ONO is +desats,  Discussed in detail the need for O2 desats in office , so will need O2 with activity.   Plan Restart Oxygen 2 l/m At bedtime  , and with activity As needed   follow up in 4 weeks with chest xray and spirometry

## 2012-08-17 NOTE — Assessment & Plan Note (Signed)
Compensated but needs O2 .  Plan  Restart Oxygen 2 l/m At bedtime  , and with activity As needed   follow up in 4 weeks with chest xray and spirometry

## 2012-08-17 NOTE — Progress Notes (Signed)
Subjective:    Patient ID: [name], female    DOB: 10-28-1946, 66 y.o.   MRN: [id number]  HPI Subjective:    Patient ID: [name], female    DOB: Nov 24, 1945, 66 y.o.   MRN: [id number]  HPI   Wt Readings from Last 3 Encounters:  08/12/11 178 lb (80.74 kg)  04/08/11 171 lb (77.565 kg)  02/25/11 179 lb (81.194 kg)   History of Present Illness:  66 year-old patient who is seen today for a health maintenance exam. She has been followed closely by pulmonary. 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 She has done quite well and is now on nocturnal O2 as well as with vigorous physical activities. She feels quite well and works part-time. She has treated hypertension and hypothyroidism;   She is followed closely by rheumatology.  Allergies (verified):  No Known Drug Allergies   Past History:  Past Medical History:  Psoriatic and rheumatoid arthritis  Hypertension  - Try off ACE July 18, 2010  Hypothyroidism  Barrett's esophagus  history of pneumococcal pneumonia and ARDS 2006  COPD  - PFT's rec July 18, 2010  - HFA 50% p coaching July 18, 2010  Chronic Respiratory Failure  - 02 dependent 24 h per day since 07/02/10  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 Pna  - CT chest 07/03/10 Nonspecific PF mostly upper lobes  history of mild vitamin D deficiency   Past Surgical History:  Reviewed history from 07/02/2010 and no changes required.   Carpal tunnel release  Cosmetic breast surgery  Abdominoplasty  Cholecystectomy  left knee arthroscopic surgery December 2007  left totally replacement, July 2008  gravida two, para two, aborta zero  negative ETT 2005  2-D echocardiogram 2006  PFT's 6-11: mild obstructive defect; moderate decrease in diffusing capacity; no response to bronchodilators. Normal. Lung findings  Chest CT April 2011- bilateral pulmonary infiltrates;  coronary artery calcification  Colonoscopy 2006 [facility]) Bilateral  Cataract  extraction surgery  Family History:  Reviewed history from 07/18/2010 and no changes required.   father died at age 31 of coronary artery disease.  mother died at 53 of breast cancer.  One sister, status post a gastric bypass  RA- Father   Social History:  Reviewed history from 07/18/2010 and no changes required.  Married  Children  Former smoker. Quit in May 2006. Smoked for approx 39 yrs up to 1 1/2 ppd  No ETOH  Works in the [employer] at [employer]    Past Medical History  Diagnosis Date  . Psoriatic arthritis   . Rheumatic disease   . Hypertension   . Hypothyroidism   . Barrett esophagus   . Pneumococcal pneumonia   . COPD (chronic obstructive pulmonary disease)   . Chronic respiratory failure   . Pulmonary fibrosis     History   Social History  . Marital Status: Married    Spouse Name: N/A    Number of Children: N/A  . Years of Education: N/A   Occupational History  . Floral Manager  [name]   Social History Main Topics  . Smoking status: Former Smoker -- 1.5 packs/day for 58.5 years    Types: Cigarettes    Quit date: 03/03/2005  . Smokeless tobacco: Never Used  . Alcohol Use: No  . Drug Use: Not on file  . Sexually Active: Not on file   Other  Topics Concern  . Not on file   Social History Narrative  . No narrative on file    Past Surgical History  Procedure Date  . Carpal tunnel release   . Cosmetic breast surgery   . Abdominoplasty   . Cholecystectomy   . Left knee arthroscopic   . Left total knee replacement     Family History  Problem Relation Age of Onset  . Coronary artery disease Father   . Breast cancer Mother   . Rheum arthritis Father     No Known Allergies  Current Outpatient Prescriptions on File Prior to Visit  Medication Sig Dispense Refill  . acetaminophen (TYLENOL ARTHRITIS PAIN) 650 MG CR tablet Per bottle  directions as needed       . amLODipine (NORVASC) 10 MG tablet Take 1 tablet (10 mg total) by mouth daily. 1 tablet daily  90 tablet  0  . budesonide-formoterol (SYMBICORT) 160-4.5 MCG/ACT inhaler Inhale 2 puffs into the lungs 2 (two) times daily. 2 puffs first thing in the am and 2 puffs about 12 hours later  3 Inhaler  3  . Calcium Carbonate-Vitamin D (CALTRATE 600+D) 600-400 MG-UNIT per tablet 1 tablet daily.        . Cholecalciferol (VITAMIN D) 2000 UNITS CAPS 1 capsule daily.        . citalopram (CELEXA) 40 MG tablet Take 1 tablet (40 mg total) by mouth daily. 1 tablet daily  90 tablet  1  . Etanercept (ENBREL) 25 MG/0.5ML SOLN 1 injection weekly       . losartan-hydrochlorothiazide (HYZAAR) 100-25 MG per tablet Take 1 tablet by mouth daily.  90 tablet  6  . omeprazole (PRILOSEC) 20 MG capsule Take 1 capsule (20 mg total) by mouth daily. 1 tablet 30 to 60 minutes before first meal of the day  90 capsule  1  . sulfaSALAzine (AZULFIDINE) 500 MG tablet Take 2 tablets (1,000 mg total) by mouth every morning. 2 tablets every am  180 tablet  3  . traMADol (ULTRAM) 50 MG tablet Take 1 tablet (50 mg total) by mouth every 6 (six) hours as needed for pain.  360 tablet  3  . DISCONTD: levothyroxine (SYNTHROID) 200 MCG tablet Take 1 tablet (200 mcg total) by mouth daily.  90 tablet  11    BP 120/74  Pulse 78  Temp 98.1 F (36.7 C) (Oral)  Resp 20  Ht 5' (1.524 m)  Wt 188 lb (85.276 kg)  BMI 36.72 kg/m2  SpO2 97%    Review of Systems  Constitutional: Negative for fever, appetite change, fatigue and unexpected weight change.  HENT: Negative for hearing loss, ear pain, nosebleeds, congestion, sore throat, mouth sores, trouble swallowing, neck stiffness, dental problem, voice change, sinus pressure and tinnitus.   Eyes: Negative for photophobia, pain, redness and visual disturbance.  Respiratory: Negative for cough, chest tightness and shortness of breath.   Cardiovascular: Negative for chest  pain, palpitations and leg swelling.  Gastrointestinal: Negative for nausea, vomiting, abdominal pain, diarrhea, constipation, blood in stool, abdominal distention and rectal pain.  Genitourinary: Negative for dysuria, urgency, frequency, hematuria, flank pain, vaginal bleeding, vaginal discharge, difficulty urinating, genital sores, vaginal pain, menstrual problem and pelvic pain.  Musculoskeletal: Negative for back pain and arthralgias.  Skin: Negative for rash.  Neurological: Negative for dizziness, syncope, speech difficulty, weakness, light-headedness, numbness and headaches.  Hematological: Negative for adenopathy. Does not bruise/bleed easily.  Psychiatric/Behavioral: Negative for suicidal ideas, behavioral problems, self-injury, dysphoric mood and agitation.  The patient is not nervous/anxious.        Objective:   Physical Exam  Constitutional: She is oriented to person, place, and time. She appears well-developed and well-nourished.  HENT:  Head: Normocephalic and atraumatic.  Right Ear: External ear normal.  Left Ear: External ear normal.  Mouth/Throat: Oropharynx is clear and moist.  Eyes: Conjunctivae and EOM are normal.  Neck: Normal range of motion. Neck supple. No JVD present. No thyromegaly present.  Cardiovascular: Normal rate, regular rhythm, normal heart sounds and intact distal pulses.   No murmur heard. Pulmonary/Chest: Effort normal and breath sounds normal. She has no wheezes. She has no rales.  Abdominal: Soft. Bowel sounds are normal. She exhibits no distension and no mass. There is no tenderness. There is no rebound and no guarding.  Musculoskeletal: Normal range of motion. She exhibits no edema and no tenderness.  Neurological: She is alert and oriented to person, place, and time. She has normal reflexes. No cranial nerve deficit. She exhibits normal muscle tone. Coordination normal.  Skin: Skin is warm and dry. No rash noted.  Psychiatric: She has a normal mood  and affect. Her behavior is normal.          Assessment & Plan:    Preventive health examination Hypothyroidism. Her TSH is slightly elevated she has been very compliant with her Synthroid supplementation we'll increase her dose to 0.2 mg daily Hypertension well controlled Rheumatoid arthritis COPD and low-grade interstitial fibrosis. Followup pulmonary  Exercise weight loss encouraged. Return here in one year or as needed  Wt Readings from Last 3 Encounters:  08/17/12 188 lb (85.276 kg)  08/10/12 189 lb 3.2 oz (85.821 kg)  08/12/11 178 lb (80.74 kg)    Review of Systems     Objective:   Physical Exam        Assessment & Plan:

## 2012-08-20 ENCOUNTER — Encounter: Payer: Self-pay | Admitting: Internal Medicine

## 2012-08-23 ENCOUNTER — Telehealth: Payer: Self-pay | Admitting: Internal Medicine

## 2012-08-23 NOTE — Telephone Encounter (Signed)
This is dr Charm Rings pt and our pt was given synvisc injections

## 2012-08-23 NOTE — Telephone Encounter (Signed)
Please advise 

## 2012-08-23 NOTE — Telephone Encounter (Signed)
Caller: [name]/Patient; Patient Name: [name]; PCP: Eleonore Chiquito [facility]); Best Callback Phone Number: [phone] Has friend who sees Dr. Lovell Sheehan and he gave "a shot in his knee" and was derived from "a chicken". Is not steroidal. Is wanting to know name of injection. Ask her PCP and was told to ask rheumatologist. Patient who saw Dr. Lovell Sheehan was [facility]. Has not ask Mr. Rachelle Hora name of injection but was told was "a miracle". Is wanting information about this injection to see if is something she may be able to get. PLEASE CALL AND ADVISE NAME OF INJECTION DR. Lovell Sheehan IS DOING FOR JOINT PAIN.

## 2012-08-24 NOTE — Telephone Encounter (Signed)
Spoke with pt- informed of name of med

## 2012-08-24 NOTE — Telephone Encounter (Signed)
Please provide information to patient so she may discuss with her rheumatologist

## 2012-09-07 ENCOUNTER — Ambulatory Visit (INDEPENDENT_AMBULATORY_CARE_PROVIDER_SITE_OTHER): Payer: Managed Care, Other (non HMO) | Admitting: Adult Health

## 2012-09-07 ENCOUNTER — Encounter: Payer: Self-pay | Admitting: Adult Health

## 2012-09-07 ENCOUNTER — Ambulatory Visit: Payer: Managed Care, Other (non HMO) | Admitting: Adult Health

## 2012-09-07 VITALS — BP 136/68 | HR 79 | Temp 97.7°F | Ht 59.0 in | Wt 195.6 lb

## 2012-09-07 DIAGNOSIS — J8410 Pulmonary fibrosis, unspecified: Secondary | ICD-10-CM

## 2012-09-07 NOTE — Patient Instructions (Addendum)
Continue on Symbicort 2 puffs Twice daily   Oxygen 2 l/m At bedtime  , and with activity As needed   Follow up in 4 months with chest xray

## 2012-09-09 NOTE — Assessment & Plan Note (Addendum)
Compensated on present regimen.  Continue on Symbicort 2 puffs Twice daily   Oxygen 2 l/m At bedtime  , and with activity As needed   Follow up in 4 months with chest xray (declned xray today , said she would get on return )

## 2012-09-09 NOTE — Progress Notes (Signed)
Subjective:    Patient ID: [name], female    DOB: 24-Jan-1946, 67 y.o.   MRN: [id number]  HPI   Subjective:    Patient ID: [name], female    DOB: [birth year], 66 y.o.   MRN: [id number]  HPI 60 yowf quit smoking in May 2006 pna/ ards resumed full activity including yardwork at wt 140 and pft's c/w restrictive changes 04/2010   July 18, 2010  1st pulmonary office eval in ER era c/o doe x 6 months progressive indolent onset with copd vs pf.  Ultimately required hosp 8/30-9/1 dx copd/pf ? cause on sulfasalzine and ACE inhib better on 02 and advair but very hoarse with sense of chest congestion and cough with white mucus day > night.  doe x > slow adls.stop advair Start symbicort 160 2 puffs first thing  in am and 2 puffs again in pm about 12 hours later  Work on inhaler technique:   stop benazapril start benicar 40/25  one daily Use 02 sleeping and any activity other than sitting still  August 20, 2010 ov Dyspnea- much better, not limiting, no cough.Try stopping the night time dose of symbicort first and then the am dose if doing well  Remain on 02 at bedtime for now at 2lpm and just use it during the day with exertion to help lose weight your lungs are actually in pretty good shape and pulmonary follow up can be as needed > did not follow recs for using 02 with ex to help loose wt  December 05, 2010 ov p exac of chf 1/31 seen in er  with HBP and bnp 222  and now 02 dep 24 hours per day,  no cough, only walking maybe 100 ft.  baseline = sob RA same distance she now struggles to do on 02 but definitely better since back on 02 and hbp rx. Rec no change rx  01/21/2011 ov cc cough 3 x a day minimal mucoid sputum, not taking symbicort even  one twice daily and no noct cough.  DOE better with intentional wt loss.  rec work on hfa technique and only use 02 hs and daytime as needed  02/25/2011 ov/Wert cc sob better with intentional wt loss to point where often not using 02 with activity  and only using symbicort one bid  - no cough or noct c/os  08/10/12 Follow up  Returns for follow up  Wants to discuss getting rid of O2.  Had ONO last week, results indicate +desats that require O2 support.  We discussed this in detail .  Walking today in office with notable desats with O2 , She was instructed to wear with  Walking as well.  She is in aggreement and admits she get short of breath with walking. And has to stop and rest.  No increased cough. No edema or fever.  >cont o2   09/09/12 Follow up  4 week follow up -  reports breathing is doing well.   no new complaints.  does not want cxr if not "absolutely necessary"  No chest pain , edema or n/v/. 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  Oropharynx no thrush or excess pnd or cobblestoning.  No JVD or cervical adenopathy. Mild accessory muscle hypertrophy. Trachea midline, nl thryroid.  Trace crackles on inps R > L base.. . Regular rate and rhythm without murmur gallop or rub or  increase P2 or edema.  Abd: no hsm, nl excursion. Ext warm without cyanosis or clubbing.

## 2012-10-25 ENCOUNTER — Other Ambulatory Visit: Payer: Self-pay | Admitting: Internal Medicine

## 2012-10-25 DIAGNOSIS — M25561 Pain in right knee: Secondary | ICD-10-CM

## 2012-11-01 ENCOUNTER — Other Ambulatory Visit: Payer: Self-pay | Admitting: *Deleted

## 2012-11-01 MED ORDER — AMLODIPINE BESYLATE 10 MG PO TABS: 10.0000 mg | ORAL_TABLET | Freq: Every day | ORAL | Status: DC

## 2012-11-01 MED ORDER — LEVOTHYROXINE SODIUM 200 MCG PO TABS: 200.0000 ug | ORAL_TABLET | Freq: Every day | ORAL | Status: DC

## 2012-11-01 MED ORDER — SULFASALAZINE 500 MG PO TABS: 1000.0000 mg | ORAL_TABLET | ORAL | Status: DC

## 2012-11-01 MED ORDER — LOSARTAN POTASSIUM-HCTZ 100-25 MG PO TABS: 1.0000 | ORAL_TABLET | Freq: Every day | ORAL | Status: DC

## 2012-11-01 MED ORDER — TRAMADOL HCL 50 MG PO TABS: 50.0000 mg | ORAL_TABLET | Freq: Four times a day (QID) | ORAL | Status: DC | PRN

## 2012-11-01 NOTE — Telephone Encounter (Signed)
Received fax from [facility] Delivery refill requests. Rx refills sent to [facility].

## 2012-11-09 ENCOUNTER — Ambulatory Visit
Admission: RE | Admit: 2012-11-09 | Discharge: 2012-11-09 | Disposition: A | Discharge status: Home / Self Care | Payer: Managed Care, Other (non HMO) | Source: Ambulatory Visit | Attending: Internal Medicine | Admitting: Internal Medicine

## 2012-11-09 DIAGNOSIS — M25561 Pain in right knee: Secondary | ICD-10-CM

## 2012-12-27 ENCOUNTER — Encounter: Payer: Self-pay | Admitting: Adult Health

## 2012-12-27 ENCOUNTER — Ambulatory Visit (INDEPENDENT_AMBULATORY_CARE_PROVIDER_SITE_OTHER)
Admission: RE | Admit: 2012-12-27 | Discharge: 2012-12-27 | Disposition: A | Discharge status: Home / Self Care | Payer: Managed Care, Other (non HMO) | Source: Ambulatory Visit | Attending: Adult Health | Admitting: Adult Health

## 2012-12-27 ENCOUNTER — Ambulatory Visit (INDEPENDENT_AMBULATORY_CARE_PROVIDER_SITE_OTHER): Payer: Managed Care, Other (non HMO) | Admitting: Adult Health

## 2012-12-27 VITALS — BP 124/60 | HR 79 | Temp 98.5°F | Ht 59.5 in | Wt 200.6 lb

## 2012-12-27 DIAGNOSIS — J8410 Pulmonary fibrosis, unspecified: Secondary | ICD-10-CM

## 2012-12-27 DIAGNOSIS — J45909 Unspecified asthma, uncomplicated: Secondary | ICD-10-CM

## 2012-12-27 NOTE — Patient Instructions (Addendum)
Continue on current regimen.  follow up Dr. Sherene Sires  In 3 months  I will call with xray results.

## 2012-12-28 ENCOUNTER — Encounter: Payer: Self-pay | Admitting: Adult Health

## 2012-12-28 NOTE — Progress Notes (Signed)
Subjective:    Patient ID: [name], female    DOB: 01-13-1946, 67 y.o.   MRN: [id number]  HPI   Subjective:    Patient ID: [name], female    DOB: 1946/09/06, 67 y.o.   MRN: [id number]  HPI 39 yowf quit smoking in May 2006 pna/ ards resumed full activity including yardwork at wt 140 and pft's c/w restrictive changes 04/2010   July 18, 2010  1st pulmonary office eval in ER era c/o doe x 6 months progressive indolent onset with copd vs pf.  Ultimately required hosp 8/30-9/1 dx copd/pf ? cause on sulfasalzine and ACE inhib better on 02 and advair but very hoarse with sense of chest congestion and cough with white mucus day > night.  doe x > slow adls.stop advair Start symbicort 160 2 puffs first thing  in am and 2 puffs again in pm about 12 hours later  Work on inhaler technique:   stop benazapril start benicar 40/25  one daily Use 02 sleeping and any activity other than sitting still  August 20, 2010 ov Dyspnea- much better, not limiting, no cough.Try stopping the night time dose of symbicort first and then the am dose if doing well  Remain on 02 at bedtime for now at 2lpm and just use it during the day with exertion to help lose weight your lungs are actually in pretty good shape and pulmonary follow up can be as needed > did not follow recs for using 02 with ex to help loose wt  December 05, 2010 ov p exac of chf 1/31 seen in er  with HBP and bnp 222  and now 02 dep 24 hours per day,  no cough, only walking maybe 100 ft.  baseline = sob RA same distance she now struggles to do on 02 but definitely better since back on 02 and hbp rx. Rec no change rx  01/21/2011 ov cc cough 3 x a day minimal mucoid sputum, not taking symbicort even  one twice daily and no noct cough.  DOE better with intentional wt loss.  rec work on hfa technique and only use 02 hs and daytime as needed  02/25/2011 ov/Wert cc sob better with intentional wt loss to point where often not using 02 with activity  and only using symbicort one bid  - no cough or noct c/os  08/10/12 Follow up  Returns for follow up  Wants to discuss getting rid of O2.  Had ONO last week, results indicate +desats that require O2 support.  We discussed this in detail .  Walking today in office with notable desats with O2 , She was instructed to wear with  Walking as well.  She is in aggreement and admits she get short of breath with walking. And has to stop and rest.  No increased cough. No edema or fever.  >cont o2   09/09/12 Follow up  4 week follow up -  reports breathing is doing well.   no new complaints.  does not want cxr if not "absolutely necessary" No chest pain , edema or n/v/. Or fever   12/27/12 Follow for Asthma and Pulmonary Fibrosis  Pt returns for a 3 month follow up  At baseline dyspnea level without flare in cough or wheezing.  Wears O2 with activity and At bedtime   No recent hospitalizations or admits.  Have more psorasis and arthritis symptoms despite Enbrel  Has follow up with Rheumatlogy this week.  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  Oropharynx no thrush or excess pnd or cobblestoning.  No JVD or cervical adenopathy. Mild accessory muscle hypertrophy. Trachea midline, nl thryroid.  Trace crackles on inps R > L base.. . Regular rate and rhythm without murmur gallop or rub or increase P2 or edema.  Abd: no hsm, nl excursion. Ext warm without cyanosis or clubbing.

## 2012-12-28 NOTE — Assessment & Plan Note (Signed)
Compensated on present regimen Cont w/ O2 w/ act and At bedtime   cxr today  follow up 3-4 months and As needed

## 2012-12-28 NOTE — Assessment & Plan Note (Signed)
Compensated on present regimen without flare  Cont current regimen  follow up Dr. Sherene Sires  In 3-4 months

## 2012-12-31 ENCOUNTER — Telehealth: Payer: Self-pay | Admitting: Adult Health

## 2012-12-31 NOTE — Telephone Encounter (Signed)
cxr looks improved Cont w/ ov recs ---  I spoke with patient about results and she verbalized understanding and had no questions

## 2013-02-03 ENCOUNTER — Other Ambulatory Visit: Payer: Self-pay | Admitting: *Deleted

## 2013-02-03 MED ORDER — CITALOPRAM HYDROBROMIDE 40 MG PO TABS: 40.0000 mg | ORAL_TABLET | Freq: Every day | ORAL | Status: DC

## 2013-02-03 MED ORDER — OMEPRAZOLE 20 MG PO CPDR: 20.0000 mg | DELAYED_RELEASE_CAPSULE | Freq: Every day | ORAL | Status: DC

## 2013-03-22 ENCOUNTER — Ambulatory Visit: Payer: Managed Care, Other (non HMO) | Admitting: Internal Medicine

## 2013-04-03 ENCOUNTER — Other Ambulatory Visit: Payer: Self-pay | Admitting: Internal Medicine

## 2013-05-03 ENCOUNTER — Ambulatory Visit (INDEPENDENT_AMBULATORY_CARE_PROVIDER_SITE_OTHER): Payer: Medicare Other | Admitting: Internal Medicine

## 2013-05-03 ENCOUNTER — Telehealth: Payer: Self-pay | Admitting: Internal Medicine

## 2013-05-03 ENCOUNTER — Encounter: Payer: Self-pay | Admitting: Internal Medicine

## 2013-05-03 VITALS — BP 128/70 | HR 79 | Temp 98.0°F | Ht 59.0 in | Wt 202.0 lb

## 2013-05-03 DIAGNOSIS — J8410 Pulmonary fibrosis, unspecified: Secondary | ICD-10-CM

## 2013-05-03 DIAGNOSIS — J45909 Unspecified asthma, uncomplicated: Secondary | ICD-10-CM | POA: Diagnosis not present

## 2013-05-03 DIAGNOSIS — R0902 Hypoxemia: Secondary | ICD-10-CM

## 2013-05-03 NOTE — Patient Instructions (Addendum)
Generic/cash drugs can be purchased inexpensively through [email or web address]  You are cleared for surgery - we will see you perioperatively if needed  Please schedule a follow up visit in 3 months but call sooner if needed with pfts and no symbicort the night before or in am

## 2013-05-03 NOTE — Telephone Encounter (Signed)
Spoke with pt and advised I will correct this in her chart She verbalized understanding and states nothing further needed

## 2013-05-03 NOTE — Progress Notes (Signed)
Subjective:    Patient ID: [name], female    DOB: [birth year]    MRN: [id number]  Brief patient profile:  24 yowf quit smoking in May 2006 pna/ ards resumed full activity including yardwork at wt 140 and pft's c/w restrictive changes 04/2010 at wt 170     July 18, 2010  1st pulmonary office eval in ER era c/o doe x 6 months progressive indolent onset with copd vs pf.  Ultimately required hosp 8/30-9/1 dx copd/pf ? cause on sulfasalzine and ACE inhib better on 02 and advair but very hoarse with sense of chest congestion and cough with white mucus day > night.  doe x > slow adls.stop advair Start symbicort 160 2 puffs first thing  in am and 2 puffs again in pm about 12 hours later  Work on inhaler technique:   stop benazapril start benicar 40/25  one daily Use 02 sleeping and any activity other than sitting still  August 20, 2010 ov Dyspnea- much better, not limiting, no cough.Try stopping the night time dose of symbicort first and then the am dose if doing well  Remain on 02 at bedtime for now at 2lpm and just use it during the day with exertion to help lose weight your lungs are actually in pretty good shape and pulmonary follow up can be as needed > did not follow recs for using 02 with ex to help loose wt  December 05, 2010 ov p exac of chf 1/31 seen in er  with HBP and bnp 222  and now 02 dep 24 hours per day,  no cough, only walking maybe 100 ft.  baseline = sob RA same distance she now struggles to do on 02 but definitely better since back on 02 and hbp rx. Rec no change rx  01/21/2011 ov cc cough 3 x a day minimal mucoid sputum, not taking symbicort even  one twice daily and no noct cough.  DOE better with intentional wt loss.  rec work on hfa technique and only use 02 hs and daytime as needed  02/25/2011 ov/Falana Clagg cc sob better with intentional wt loss to point where often not using 02 with activity and only using symbicort one bid  - no cough or noct c/os  08/10/12 Follow up   Returns for follow up  Wants to discuss getting rid of O2.  Had ONO last week, results indicate +desats that require O2 support.  We discussed this in detail .  Walking today in office with notable desats with O2 , She was instructed to wear with  Walking as well.  She is in aggreement and admits she get short of breath with walking. And has to stop and rest.  No increased cough. No edema or fever.  >cont o2    05/03/2013 consultation per Dr Despina Hick   re pre op eval asthma/pf p ards on symbicort 160 2bid /02 at hs only Chief Complaint  Patient presents with  . Follow-up    Breathing doing well. Here for pulmonary clearance for TKR-left. Surgery already set for 06/06/13 per Dr Despina Hick.     had TKR 5 years prior to OV  At [facility]. Not needing any rescue at all, limited much more by knees than breathing.  No obvious daytime variabilty or assoc chronic cough or cp or chest tightness, subjective wheeze overt sinus or hb symptoms. No unusual exp hx or h/o childhood pna/ asthma or knowledge of premature birth.   Sleeping ok without nocturnal  or early am exacerbation  of respiratory  c/o's or need for noct saba. Also denies any obvious fluctuation of symptoms with weather or environmental changes or other aggravating or alleviating factors except as outlined above     Current Medications, Allergies,   Past Surgical History, Family History, and Social History were reviewed in [employer] record.  ROS  The following are not active complaints unless bolded sore throat, dysphagia, dental problems, itching, sneezing,  nasal congestion or excess/ purulent secretions, ear ache,   fever, chills, sweats, unintended wt loss, pleuritic or exertional cp, hemoptysis,  orthopnea pnd or leg swelling, presyncope, palpitations, heartburn, abdominal pain, anorexia, nausea, vomiting, diarrhea  or change in bowel or urinary habits, change in stools or urine, dysuria,hematuria,  rash, arthralgias,  visual complaints, headache, numbness weakness or ataxia or problems with walking or coordination,  change in mood/affect or memory.        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       - CT chest 07/03/10 Nonspecific PF mostly upper lobes       - CT chest 12/03/10 acute gg changes and effusions c/w chf        - PFT's  04/12/10 FEV1  1.21 (69%) ratio 77 and no change p B2,  DLC0 56% Asthmatic bronchitis     - HFA 50% p coaching July 18, 2010 >>  90%  02/25/2011  history of mild vitamin D deficiency        Objective:   Physical Exam    amb slt hoarse mod obese wf nad all smiles    wt 192 July 18, 2010 > 197 August 21, 2010 > 207 December 05, 2010  > 183  01/21/2011  > 179 02/25/2011 >189 08/10/2012 >195 09/08/12 >200 12/27/12 > 202 05/03/2013  HEENT mild turbinate edema.  Oropharynx no thrush or excess pnd or cobblestoning.  No JVD or cervical adenopathy. Mild accessory muscle hypertrophy. Trachea midline, nl thryroid.  Trace crackles on insp  R > L base.. . Regular rate and rhythm without murmur gallop or rub or increase P2 or edema.  Abd: no hsm, nl excursion. Ext warm without cyanosis or clubbing. Skin with classic psoriatic changes both hands  cxr 12/27/12 Improved chronic interstitial accentuation.

## 2013-05-05 NOTE — Assessment & Plan Note (Signed)
-  s/p ARDS 2006 with bacteremic S  Pna       - CT chest 07/03/10 Nonspecific PF mostly upper lobes       - CT chest 12/03/10 acute gg changes and effusions c/w chf        - PFT's  04/12/10 FEV1  1.21 (69%) ratio 77 and no change p B2,  DLC0 56%  She has no evidence of significant limitations due to post ards PF and needs 02 at hs probably more related to obesity than PF  Therefore no contraindication to knee surgery but needs early mobilization/ IS/ minimize narcotics > Discussed in detail all the  indications, usual  risks and alternatives  relative to the benefits with patient who agrees to proceed with knee surgery with very small risk flare of PF or worse resp failure in this setting

## 2013-05-05 NOTE — Assessment & Plan Note (Signed)
-   02 dependent  since 07/02/10 >>  83% RA December 05, 2010       - ONO RA 08/05/12  :  Positive sat < 89 x 2:69m> repeat on 2lpm rec 08/12/2012   Adequate control on present rx, reviewed need to continue 02 at hs

## 2013-05-05 NOTE — Assessment & Plan Note (Signed)
-   Clinical dx based on response to symbiicort     - HFA 90% 02/25/11  Not clear how much she really needs the symbicort at this point but very well compensated > needs pft's post op off symbicort for baseline and consider taper then

## 2013-05-24 ENCOUNTER — Other Ambulatory Visit: Payer: Self-pay | Admitting: Orthopedic Surgery

## 2013-05-24 MED ORDER — DEXAMETHASONE SODIUM PHOSPHATE 10 MG/ML IJ SOLN: 10.0000 mg | Freq: Once | INTRAMUSCULAR | Status: DC

## 2013-05-24 MED ORDER — BUPIVACAINE LIPOSOME 1.3 % IJ SUSP: 20.0000 mL | Freq: Once | INTRAMUSCULAR | Status: DC

## 2013-05-24 NOTE — Progress Notes (Signed)
Preoperative surgical orders have been place into the Epic hospital system for [name] on 05/24/2013, 6:38 PM  by Patrica Duel for surgery on 06/06/2013.  Preop Total Knee orders including Experal, PO Tylenol, and IV Decadron as long as there are no contraindications to the above medications. Avel Peace, PA-C

## 2013-05-24 NOTE — Progress Notes (Signed)
Need orders please - pt coming for preop TUES 05/31/13 - thank you

## 2013-05-30 ENCOUNTER — Encounter (HOSPITAL_COMMUNITY): Payer: Self-pay | Admitting: Pharmacy Technician

## 2013-05-31 ENCOUNTER — Encounter (HOSPITAL_COMMUNITY)
Admission: RE | Admit: 2013-05-31 | Discharge: 2013-05-31 | Disposition: A | Discharge status: Home / Self Care | Payer: Medicare Other | Source: Ambulatory Visit | Attending: Orthopedic Surgery | Admitting: Orthopedic Surgery

## 2013-05-31 ENCOUNTER — Encounter (HOSPITAL_COMMUNITY): Payer: Self-pay

## 2013-05-31 DIAGNOSIS — Z01812 Encounter for preprocedural laboratory examination: Secondary | ICD-10-CM | POA: Diagnosis not present

## 2013-05-31 DIAGNOSIS — R9431 Abnormal electrocardiogram [ECG] [EKG]: Secondary | ICD-10-CM | POA: Insufficient documentation

## 2013-05-31 DIAGNOSIS — I4510 Unspecified right bundle-branch block: Secondary | ICD-10-CM | POA: Diagnosis not present

## 2013-05-31 DIAGNOSIS — Z0183 Encounter for blood typing: Secondary | ICD-10-CM | POA: Diagnosis not present

## 2013-05-31 DIAGNOSIS — Z01810 Encounter for preprocedural cardiovascular examination: Secondary | ICD-10-CM | POA: Diagnosis not present

## 2013-05-31 HISTORY — DX: Personal history of other diseases of the respiratory system: Z87.09

## 2013-05-31 HISTORY — DX: Dependence on supplemental oxygen: Z99.81

## 2013-05-31 HISTORY — DX: Unspecified osteoarthritis, unspecified site: M19.90

## 2013-05-31 HISTORY — DX: Gastro-esophageal reflux disease without esophagitis: K21.9

## 2013-05-31 LAB — URINALYSIS, ROUTINE W REFLEX MICROSCOPIC
Glucose, UA: NEGATIVE mg/dL
Hgb urine dipstick: NEGATIVE
Ketones, ur: NEGATIVE mg/dL
Leukocytes, UA: NEGATIVE
Nitrite: NEGATIVE
Protein, ur: 30 mg/dL — AB
Specific Gravity, Urine: 1.027 (ref 1.005–1.030)
Urobilinogen, UA: 0.2 mg/dL (ref 0.0–1.0)
pH: 5 (ref 5.0–8.0)

## 2013-05-31 LAB — URINE MICROSCOPIC-ADD ON

## 2013-05-31 LAB — PROTIME-INR
INR: 0.9 (ref 0.00–1.49)
Prothrombin Time: 12 seconds (ref 11.6–15.2)

## 2013-05-31 LAB — APTT: aPTT: 28 seconds (ref 24–37)

## 2013-05-31 LAB — CBC
HCT: 37.9 % (ref 36.0–46.0)
Hemoglobin: 11.8 g/dL — ABNORMAL LOW (ref 12.0–15.0)
MCH: 29.4 pg (ref 26.0–34.0)
MCHC: 31.1 g/dL (ref 30.0–36.0)
MCV: 94.5 fL (ref 78.0–100.0)
Platelets: 213 10*3/uL (ref 150–400)
RBC: 4.01 MIL/uL (ref 3.87–5.11)
RDW: 13.4 % (ref 11.5–15.5)
WBC: 9.1 10*3/uL (ref 4.0–10.5)

## 2013-05-31 LAB — COMPREHENSIVE METABOLIC PANEL
ALT: 24 U/L (ref 0–35)
AST: 27 U/L (ref 0–37)
Albumin: 3.5 g/dL (ref 3.5–5.2)
Alkaline Phosphatase: 62 U/L (ref 39–117)
BUN: 28 mg/dL — ABNORMAL HIGH (ref 6–23)
CO2: 30 mEq/L (ref 19–32)
Calcium: 9.5 mg/dL (ref 8.4–10.5)
Chloride: 100 mEq/L (ref 96–112)
Creatinine, Ser: 0.92 mg/dL (ref 0.50–1.10)
GFR calc Af Amer: 74 mL/min — ABNORMAL LOW (ref >90)
GFR calc non Af Amer: 63 mL/min — ABNORMAL LOW (ref >90)
Glucose, Bld: 104 mg/dL — ABNORMAL HIGH (ref 70–99)
Potassium: 4.1 mEq/L (ref 3.5–5.1)
Sodium: 137 mEq/L (ref 135–145)
Total Bilirubin: 0.2 mg/dL — ABNORMAL LOW (ref 0.3–1.2)
Total Protein: 7.4 g/dL (ref 6.0–8.3)

## 2013-05-31 LAB — SURGICAL PCR SCREEN
MRSA, PCR: NEGATIVE
Staphylococcus aureus: POSITIVE — AB

## 2013-05-31 NOTE — Progress Notes (Signed)
05/31/13 1528  OBSTRUCTIVE SLEEP APNEA  Have you ever been diagnosed with sleep apnea through a sleep study? No  Do you snore loudly (loud enough to be heard through closed doors)?  0  Do you often feel tired, fatigued, or sleepy during the daytime? 0  Has anyone observed you stop breathing during your sleep? 1  Do you have, or are you being treated for high blood pressure? 1  BMI more than 35 kg/m2? 1  Age over 66 years old? 1  Neck circumference greater than 40 cm/18 inches? 0  Gender: 0  Obstructive Sleep Apnea Score 4  Score 4 or greater  Results sent to PCP

## 2013-05-31 NOTE — Patient Instructions (Signed)
YOUR SURGERY IS SCHEDULED AT [facility]  ON:  Monday  8/4  REPORT TO [facility] AT:  5:00 AM      PHONE # FOR SHORT STAY IS [phone]  DO NOT EAT OR DRINK ANYTHING AFTER MIDNIGHT THE NIGHT BEFORE YOUR SURGERY.  YOU MAY BRUSH YOUR TEETH, RINSE OUT YOUR MOUTH--BUT NO WATER, NO FOOD, NO CHEWING GUM, NO MINTS, NO CANDIES, NO CHEWING TOBACCO.  PLEASE TAKE THE FOLLOWING MEDICATIONS THE AM OF YOUR SURGERY WITH A FEW SIPS OF WATER:  AMLODIPINE, CITALOPRAM, SYNTHROID, OMEPRAZOLE, TRAMADOL.  USE SYMBICORT    DO NOT BRING VALUABLES, MONEY, CREDIT CARDS.  DO NOT WEAR JEWELRY, MAKE-UP, NAIL POLISH AND NO METAL PINS OR CLIPS IN YOUR HAIR. CONTACT LENS, DENTURES / PARTIALS, GLASSES SHOULD NOT BE WORN TO SURGERY AND IN MOST CASES-HEARING AIDS WILL NEED TO BE REMOVED.  BRING YOUR GLASSES CASE, ANY EQUIPMENT NEEDED FOR YOUR CONTACT LENS. FOR PATIENTS ADMITTED TO THE HOSPITAL--CHECK OUT TIME THE DAY OF DISCHARGE IS 11:00 AM.  ALL INPATIENT ROOMS ARE PRIVATE - WITH BATHROOM, TELEPHONE, TELEVISION AND WIFI INTERNET.                             PLEASE READ OVER ANY  FACT SHEETS THAT YOU WERE GIVEN: MRSA INFORMATION, BLOOD TRANSFUSION INFORMATION, INCENTIVE SPIROMETER INFORMATION. FAILURE TO FOLLOW THESE INSTRUCTIONS MAY RESULT IN THE CANCELLATION OF YOUR SURGERY.   PATIENT SIGNATURE_________________________________

## 2013-05-31 NOTE — Pre-Procedure Instructions (Signed)
CXR REPORT 12/27/12 IN EPIC PULMONARY OFFICE NOTE WITH CLEARANCE FOR SURGERY IN EPIC FROM DR. Sherene Sires. EKG WAS DONE TODAY AT [facility].

## 2013-06-05 ENCOUNTER — Other Ambulatory Visit: Payer: Self-pay | Admitting: Orthopedic Surgery

## 2013-06-05 NOTE — H&P (Signed)
[name]  DOB: 03/11/1946 Married / Language: English / Race: White Female  Date of Admission:  06/06/2013  Chief Complaint:  Right knee pain  History of Present Illness The patient is a 67 year old female who comes in for a preoperative History and Physical. The patient is scheduled for a right total knee arthroplasty to be performed by Dr. Frank V. [name], MD at [facility] on 06/06/2013. The patient is a 66 year old female who presents with knee complaints. The patient reports right knee symptoms including: pain and weakness which began 1 year(s) ago without any known injury (Patient states that she has been having pain in her right knee. She said that it increases with prolonged standing. It seems to hurt more on the anterior medial side. She takes alot of pain medicines already for Rheumatoid Arthritis. She has had a knee replacement on the left with Dr.Olin and has continued to have pain on the lateral side of that knee. ).The patient feels that the symptoms are worsening. Prior to being seen today the patient was previously evaluated by a primary care provider. Previous work-up for this problem has included knee x-rays and knee MRI (11/09/2012). The patient's knee is getting progressively worse at this time. It is limiting what she can and cannot do. Pain is worse with weightbearing than it is at rest, but also is occurring at rest. She says the pain is similar to what she experienced prior to her left knee replacement several years ago. She had it done by Dr. Olin. She is still having some discomfort with the knee. She has decided that she wants me to fix this knee. She is at a stage where she is ready to get the knee fixed. She has had injections, but they have not provided any benefit. They have been treated conservatively in the past for the above stated problem and despite conservative measures, they continue to have progressive pain and severe functional  limitations and dysfunction. They have failed non-operative management including home exercise, medications, and injections. It is felt that they would benefit from undergoing total joint replacement. Risks and benefits of the procedure have been discussed with the patient and they elect to proceed with surgery. There are no active contraindications to surgery such as ongoing infection or rapidly progressive neurological disease.   Problem List Primary osteoarthritis of one knee (715.16)   Allergies No Known Drug Allergies   Family History Rheumatoid Arthritis. father Osteoarthritis. father Heart Disease. father Cancer. mother Hypertension. father Heart disease in female family member before age 55   Social History Drug/Alcohol Rehab (Currently). no Current work status. working part time Drug/Alcohol Rehab (Previously). no Illicit drug use. no Exercise. Exercises rarely; does other Children. 2 Alcohol use. current drinker; drinks hard liquor; only occasionally per week Tobacco / smoke exposure. no Previously in rehab. no Tobacco use. former smoker; smoke(d) 1 1/2 pack(s) per day Marital status. married Living situation. 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 less than 1   Medication History Tylenol ( Oral) Specific dose unknown - Active. AmLODIPine Besylate (10MG Tablet, Oral) Active. Calcium Carbonate-Vit D-Min (600-400MG-UNIT Tablet, Oral) Active. Citalopram Hydrobromide ( Oral) Specific dose unknown - Active. Embrel Active. Synthroid ( Oral) Specific dose unknown - Active. Losartan Potassium-HCTZ (100-25MG Tablet, Oral) Active. Omeprazole ( Oral) Specific dose unknown - Active. SulfaSALAzine ( Oral) Specific dose unknown - Active. Symbicort ( Inhalation)   Specific dose unknown - Active. TraMADol HCl ( Oral) Specific dose unknown -  Active.   Past Surgical History Gallbladder Surgery. laporoscopic Cataract Surgery. bilateral Tonsillectomy Mammoplasty; Reduction. bilateral Carpal Tunnel Repair. bilateral Arthroscopy of Knee. left Appendectomy Breast Reconstruction. bilateral Breast Biopsy. left Total Knee Replacement. left   Medical History Osteoarthritis Hypothyroidism Rheumatoid Arthritis Psoriatic Arhtritis Hyperthyroidism Gastroesophageal Reflux Disease Hypercholesterolemia High blood pressure ARDS. 2006 following a bacterial pneumonia. 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 Past History - 2006 Depression Hemorrhoids   Review of Systems General:Not Present- Chills, Fever, Night Sweats, Fatigue, Weight Gain, Weight Loss and Memory Loss. Skin:Not Present- Hives, Itching, Rash, Eczema and Lesions. HEENT:Not Present- Tinnitus, Headache, Double Vision, Visual Loss, Hearing Loss and Dentures. Respiratory:Present- Shortness of breath with exertion and Wheezing. Not Present- Shortness of breath at rest, Allergies, Coughing up blood and Chronic Cough. Cardiovascular:Not Present- Chest Pain, Racing/skipping heartbeats, Difficulty Breathing Lying Down, Murmur, Swelling and Palpitations. Gastrointestinal:Not Present- Bloody Stool, Heartburn, Abdominal Pain, Vomiting, Nausea, Constipation, Diarrhea, Difficulty Swallowing, Jaundice and Loss of appetitie. Female Genitourinary:Not Present- Blood in Urine, Urinary frequency, Weak urinary stream, Discharge, Flank Pain, Incontinence, Painful Urination, Urgency, Urinary Retention and Urinating at Night. Musculoskeletal:Present- Joint Pain and Morning Stiffness. Not Present- Muscle Weakness, Muscle Pain, Joint Swelling, Back Pain and Spasms. Neurological:Not Present- Tremor, Dizziness, Blackout spells, Paralysis, Difficulty with balance and Weakness. Psychiatric:Not Present- Insomnia.   Vitals Pulse: 92 (Regular)  Resp.: 20 (Unlabored) BP: 138/68 (Sitting, Left Arm, Standard)    Physical Exam The physical exam findings are as follows:  Note: Patient is a 67 year old female with continued right knee pain. Patient is accompanied today by her husband Joe.   General Mental Status - Alert, cooperative and good historian. General Appearance- pleasant. Not in acute distress. Orientation- Oriented X3. Build & Nutrition- Well nourished and Well developed.   Head and Neck Head- normocephalic, atraumatic . Neck Global Assessment- supple. no bruit auscultated on the right and no bruit auscultated on the left.   Eye Pupil- Bilateral- Regular and Round. Motion- Bilateral- EOMI.   Chest and Lung Exam Auscultation: Breath sounds:- clear at anterior chest wall and - clear at posterior chest wall. Adventitious sounds:Inspiratory & expiratory crackles- Left Lung Field (expiratory).   Cardiovascular Auscultation:Rhythm- Regular rate and rhythm. Heart Sounds- S1 WNL and S2 WNL. Murmurs & Other Heart Sounds:Auscultation of the heart reveals - No Murmurs.   Abdomen Palpation/Percussion:Tenderness- Abdomen is non-tender to palpation. Rigidity (guarding)- Abdomen is soft. Auscultation:Auscultation of the abdomen reveals - Bowel sounds normal.   Female Genitourinary Not done, not pertinent to present illness  Musculoskeletal  Well developed female, alert and oriented in no apparent distress. Her hips show normal range of motion and no discomfort. The left knee shows no effusion. Range 0 to 115 with no tenderness or instability. The right knee slight valgus deformity. Range 5 to 125. Moderate crepitus on range of motion. Tender lateral greater than medial with no instability noted. Pulses sensation and motor are intact.  RADIOGRAPHS: AP both knees and lateral of the right show that she has a valgus deformity right knee, bone on bone lateral compartment with  patellofemoral arthritis also. The left knee prosthesis in good position with no abnormalities.  Assessment & Plan Primary osteoarthritis of one knee (715.16) Impression: Right Knee  Note: Plan is for a Right Total Knee Replacement by Dr. [name].  Plan is to go home with her husband, Joe.  The patient does not have any contraindications and will   recieve TXA (tranexamic acid) prior to surgery.  PCP - Dr. Kwiatkowski - Patient has been seen preoperatively and felt to be stable for surgery. Pulmonary - Dr. Wert - Patient has been seen preoperatively and felt to be stable for surgery.  Time Spent - 30 minutes.  Signed electronically by Alexzandrew L Perkins, III PA-C 

## 2013-06-06 ENCOUNTER — Encounter (HOSPITAL_COMMUNITY): Payer: Self-pay | Admitting: *Deleted

## 2013-06-06 ENCOUNTER — Inpatient Hospital Stay (HOSPITAL_COMMUNITY): Payer: Medicare Other | Admitting: Anesthesiology

## 2013-06-06 ENCOUNTER — Encounter (HOSPITAL_COMMUNITY): Payer: Self-pay | Admitting: Anesthesiology

## 2013-06-06 ENCOUNTER — Inpatient Hospital Stay (HOSPITAL_COMMUNITY)
Admission: RE | Admit: 2013-06-06 | Discharge: 2013-06-08 | DRG: 470 | Disposition: A | Discharge status: Home Health | Payer: Medicare Other | Source: Ambulatory Visit | Attending: Orthopedic Surgery | Admitting: Orthopedic Surgery

## 2013-06-06 ENCOUNTER — Encounter (HOSPITAL_COMMUNITY)
Admission: RE | Disposition: A | Discharge status: Home Health | Payer: Self-pay | Source: Ambulatory Visit | Attending: Orthopedic Surgery

## 2013-06-06 DIAGNOSIS — Z6841 Body Mass Index (BMI) 40.0 and over, adult: Secondary | ICD-10-CM | POA: Diagnosis not present

## 2013-06-06 DIAGNOSIS — I10 Essential (primary) hypertension: Secondary | ICD-10-CM | POA: Diagnosis present

## 2013-06-06 DIAGNOSIS — J449 Chronic obstructive pulmonary disease, unspecified: Secondary | ICD-10-CM | POA: Diagnosis not present

## 2013-06-06 DIAGNOSIS — K219 Gastro-esophageal reflux disease without esophagitis: Secondary | ICD-10-CM | POA: Diagnosis present

## 2013-06-06 DIAGNOSIS — Z01812 Encounter for preprocedural laboratory examination: Secondary | ICD-10-CM

## 2013-06-06 DIAGNOSIS — M069 Rheumatoid arthritis, unspecified: Secondary | ICD-10-CM | POA: Diagnosis present

## 2013-06-06 DIAGNOSIS — D62 Acute posthemorrhagic anemia: Secondary | ICD-10-CM

## 2013-06-06 DIAGNOSIS — M25569 Pain in unspecified knee: Secondary | ICD-10-CM | POA: Diagnosis not present

## 2013-06-06 DIAGNOSIS — K2270 Barrett's esophagus without dysplasia: Secondary | ICD-10-CM | POA: Diagnosis present

## 2013-06-06 DIAGNOSIS — Z9981 Dependence on supplemental oxygen: Secondary | ICD-10-CM | POA: Diagnosis not present

## 2013-06-06 DIAGNOSIS — E039 Hypothyroidism, unspecified: Secondary | ICD-10-CM | POA: Diagnosis present

## 2013-06-06 DIAGNOSIS — E7800 Pure hypercholesterolemia, unspecified: Secondary | ICD-10-CM | POA: Diagnosis present

## 2013-06-06 DIAGNOSIS — IMO0002 Reserved for concepts with insufficient information to code with codable children: Secondary | ICD-10-CM | POA: Diagnosis not present

## 2013-06-06 DIAGNOSIS — M1710 Unilateral primary osteoarthritis, unspecified knee: Secondary | ICD-10-CM | POA: Diagnosis not present

## 2013-06-06 DIAGNOSIS — Z01810 Encounter for preprocedural cardiovascular examination: Secondary | ICD-10-CM | POA: Diagnosis not present

## 2013-06-06 DIAGNOSIS — F329 Major depressive disorder, single episode, unspecified: Secondary | ICD-10-CM | POA: Diagnosis present

## 2013-06-06 DIAGNOSIS — J8410 Pulmonary fibrosis, unspecified: Secondary | ICD-10-CM | POA: Diagnosis not present

## 2013-06-06 DIAGNOSIS — Z87891 Personal history of nicotine dependence: Secondary | ICD-10-CM | POA: Diagnosis not present

## 2013-06-06 DIAGNOSIS — Z96651 Presence of right artificial knee joint: Secondary | ICD-10-CM

## 2013-06-06 DIAGNOSIS — F3289 Other specified depressive episodes: Secondary | ICD-10-CM | POA: Diagnosis present

## 2013-06-06 HISTORY — PX: TOTAL KNEE ARTHROPLASTY: SHX125

## 2013-06-06 LAB — TYPE AND SCREEN
ABO/RH(D): O POS
Antibody Screen: NEGATIVE

## 2013-06-06 SURGERY — ARTHROPLASTY, KNEE, TOTAL
Anesthesia: Spinal | Site: Knee | Laterality: Right | Wound class: Clean

## 2013-06-06 MED ORDER — PROPOFOL INFUSION 10 MG/ML OPTIME
INTRAVENOUS | Status: DC | PRN
  Administered 2013-06-06: 50 ug/kg/min via INTRAVENOUS

## 2013-06-06 MED ORDER — POLYETHYLENE GLYCOL 3350 17 G PO PACK: 17.0000 g | PACK | Freq: Every day | ORAL | Status: DC | PRN

## 2013-06-06 MED ORDER — MENTHOL 3 MG MT LOZG
1.0000 | LOZENGE | OROMUCOSAL | Status: DC | PRN
  Filled 2013-06-06: qty 9

## 2013-06-06 MED ORDER — PHENYLEPHRINE HCL 10 MG/ML IJ SOLN
INTRAMUSCULAR | Status: DC | PRN
  Administered 2013-06-06 (×3): 40 ug via INTRAVENOUS

## 2013-06-06 MED ORDER — TRANEXAMIC ACID 100 MG/ML IV SOLN
1000.0000 mg | INTRAVENOUS | Status: AC
  Administered 2013-06-06: 1000 mg via INTRAVENOUS
  Filled 2013-06-06: qty 10

## 2013-06-06 MED ORDER — DIPHENHYDRAMINE HCL 12.5 MG/5ML PO ELIX
12.5000 mg | ORAL_SOLUTION | ORAL | Status: DC | PRN
  Administered 2013-06-07: 12.5 mg via ORAL
  Filled 2013-06-06: qty 5

## 2013-06-06 MED ORDER — SODIUM CHLORIDE 0.9 % IV SOLN: INTRAVENOUS | Status: DC

## 2013-06-06 MED ORDER — LEVOTHYROXINE SODIUM 200 MCG PO TABS
200.0000 ug | ORAL_TABLET | Freq: Every day | ORAL | Status: DC
  Administered 2013-06-07 – 2013-06-08 (×2): 200 ug via ORAL
  Filled 2013-06-06 (×3): qty 1

## 2013-06-06 MED ORDER — DEXTROSE 5 % IV SOLN
500.0000 mg | Freq: Four times a day (QID) | INTRAVENOUS | Status: DC | PRN
  Administered 2013-06-06: 500 mg via INTRAVENOUS
  Filled 2013-06-06: qty 5

## 2013-06-06 MED ORDER — BUDESONIDE-FORMOTEROL FUMARATE 160-4.5 MCG/ACT IN AERO
2.0000 | INHALATION_SPRAY | Freq: Two times a day (BID) | RESPIRATORY_TRACT | Status: DC
  Administered 2013-06-06 – 2013-06-08 (×4): 2 via RESPIRATORY_TRACT
  Filled 2013-06-06: qty 6

## 2013-06-06 MED ORDER — CEFAZOLIN SODIUM 1-5 GM-% IV SOLN
1.0000 g | Freq: Four times a day (QID) | INTRAVENOUS | Status: AC
  Administered 2013-06-06 (×2): 1 g via INTRAVENOUS
  Filled 2013-06-06 (×2): qty 50

## 2013-06-06 MED ORDER — SULFASALAZINE 500 MG PO TABS
1000.0000 mg | ORAL_TABLET | Freq: Every morning | ORAL | Status: DC
  Administered 2013-06-06 – 2013-06-08 (×3): 1000 mg via ORAL
  Filled 2013-06-06 (×3): qty 2

## 2013-06-06 MED ORDER — PHENYLEPHRINE HCL 10 MG/ML IJ SOLN: 30.0000 ug/min | INTRAVENOUS | Status: DC

## 2013-06-06 MED ORDER — FLEET ENEMA 7-19 GM/118ML RE ENEM: 1.0000 | ENEMA | Freq: Once | RECTAL | Status: AC | PRN

## 2013-06-06 MED ORDER — ONDANSETRON HCL 4 MG PO TABS: 4.0000 mg | ORAL_TABLET | Freq: Four times a day (QID) | ORAL | Status: DC | PRN

## 2013-06-06 MED ORDER — TRAMADOL HCL 50 MG PO TABS
100.0000 mg | ORAL_TABLET | Freq: Every day | ORAL | Status: DC
  Administered 2013-06-06 – 2013-06-07 (×2): 100 mg via ORAL
  Filled 2013-06-06 (×2): qty 2

## 2013-06-06 MED ORDER — HYDROMORPHONE HCL PF 1 MG/ML IJ SOLN
0.2500 mg | INTRAMUSCULAR | Status: DC | PRN
  Administered 2013-06-06: 0.5 mg via INTRAVENOUS

## 2013-06-06 MED ORDER — SODIUM CHLORIDE 0.9 % IV SOLN
INTRAVENOUS | Status: DC
  Administered 2013-06-06 – 2013-06-07 (×2): via INTRAVENOUS

## 2013-06-06 MED ORDER — METOCLOPRAMIDE HCL 10 MG PO TABS: 5.0000 mg | ORAL_TABLET | Freq: Three times a day (TID) | ORAL | Status: DC | PRN

## 2013-06-06 MED ORDER — PHENOL 1.4 % MT LIQD
1.0000 | OROMUCOSAL | Status: DC | PRN
  Filled 2013-06-06: qty 177

## 2013-06-06 MED ORDER — ONDANSETRON HCL 4 MG/2ML IJ SOLN: 4.0000 mg | Freq: Four times a day (QID) | INTRAMUSCULAR | Status: DC | PRN

## 2013-06-06 MED ORDER — AMLODIPINE BESYLATE 10 MG PO TABS
10.0000 mg | ORAL_TABLET | Freq: Every morning | ORAL | Status: DC
  Administered 2013-06-07 – 2013-06-08 (×2): 10 mg via ORAL
  Filled 2013-06-06 (×2): qty 1

## 2013-06-06 MED ORDER — MORPHINE SULFATE 2 MG/ML IJ SOLN
1.0000 mg | INTRAMUSCULAR | Status: DC | PRN
  Administered 2013-06-06: 1 mg via INTRAVENOUS
  Filled 2013-06-06: qty 1

## 2013-06-06 MED ORDER — LACTATED RINGERS IV SOLN
INTRAVENOUS | Status: DC
  Administered 2013-06-06: 09:00:00 via INTRAVENOUS

## 2013-06-06 MED ORDER — BUPIVACAINE HCL 0.25 % IJ SOLN
INTRAMUSCULAR | Status: DC | PRN
  Administered 2013-06-06: 20 mL

## 2013-06-06 MED ORDER — DEXAMETHASONE SODIUM PHOSPHATE 10 MG/ML IJ SOLN
10.0000 mg | Freq: Every day | INTRAMUSCULAR | Status: AC
  Filled 2013-06-06: qty 1

## 2013-06-06 MED ORDER — BISACODYL 10 MG RE SUPP: 10.0000 mg | Freq: Every day | RECTAL | Status: DC | PRN

## 2013-06-06 MED ORDER — SODIUM CHLORIDE 0.9 % IJ SOLN
INTRAMUSCULAR | Status: DC | PRN
  Administered 2013-06-06: 30 mL via INTRAVENOUS

## 2013-06-06 MED ORDER — BUPIVACAINE LIPOSOME 1.3 % IJ SUSP
20.0000 mL | Freq: Once | INTRAMUSCULAR | Status: AC
  Administered 2013-06-06: 20 mL
  Filled 2013-06-06: qty 20

## 2013-06-06 MED ORDER — BUPIVACAINE LIPOSOME 1.3 % IJ SUSP
20.0000 mL | Freq: Once | INTRAMUSCULAR | Status: DC
  Filled 2013-06-06: qty 20

## 2013-06-06 MED ORDER — LACTATED RINGERS IV SOLN: INTRAVENOUS | Status: DC

## 2013-06-06 MED ORDER — DEXAMETHASONE 6 MG PO TABS
10.0000 mg | ORAL_TABLET | Freq: Every day | ORAL | Status: AC
  Administered 2013-06-07: 10 mg via ORAL
  Filled 2013-06-06: qty 1

## 2013-06-06 MED ORDER — CITALOPRAM HYDROBROMIDE 40 MG PO TABS
40.0000 mg | ORAL_TABLET | Freq: Every morning | ORAL | Status: DC
  Administered 2013-06-07 – 2013-06-08 (×2): 40 mg via ORAL
  Filled 2013-06-06 (×2): qty 1

## 2013-06-06 MED ORDER — PHENYLEPHRINE HCL 10 MG/ML IJ SOLN
10.0000 mg | INTRAVENOUS | Status: DC | PRN
  Administered 2013-06-06: 40 ug/min via INTRAVENOUS

## 2013-06-06 MED ORDER — TRAMADOL HCL 50 MG PO TABS: 50.0000 mg | ORAL_TABLET | Freq: Four times a day (QID) | ORAL | Status: DC | PRN

## 2013-06-06 MED ORDER — DEXAMETHASONE SODIUM PHOSPHATE 10 MG/ML IJ SOLN
10.0000 mg | Freq: Once | INTRAMUSCULAR | Status: DC
Start: 2013-06-06 — End: 2013-06-06

## 2013-06-06 MED ORDER — PROMETHAZINE HCL 25 MG/ML IJ SOLN: 6.2500 mg | INTRAMUSCULAR | Status: DC | PRN

## 2013-06-06 MED ORDER — LACTATED RINGERS IV SOLN
INTRAVENOUS | Status: DC | PRN
  Administered 2013-06-06 (×2): via INTRAVENOUS

## 2013-06-06 MED ORDER — METOCLOPRAMIDE HCL 5 MG/ML IJ SOLN: 5.0000 mg | Freq: Three times a day (TID) | INTRAMUSCULAR | Status: DC | PRN

## 2013-06-06 MED ORDER — DEXAMETHASONE SODIUM PHOSPHATE 10 MG/ML IJ SOLN
INTRAMUSCULAR | Status: DC | PRN
  Administered 2013-06-06: 10 mg via INTRAVENOUS

## 2013-06-06 MED ORDER — TRAMADOL HCL 50 MG PO TABS
50.0000 mg | ORAL_TABLET | Freq: Two times a day (BID) | ORAL | Status: DC
  Administered 2013-06-07: 50 mg via ORAL
  Filled 2013-06-06: qty 1

## 2013-06-06 MED ORDER — RIVAROXABAN 10 MG PO TABS
10.0000 mg | ORAL_TABLET | Freq: Every day | ORAL | Status: DC
  Administered 2013-06-07 – 2013-06-08 (×2): 10 mg via ORAL
  Filled 2013-06-06 (×3): qty 1

## 2013-06-06 MED ORDER — TRAMADOL HCL 50 MG PO TABS: 50.0000 mg | ORAL_TABLET | Freq: Three times a day (TID) | ORAL | Status: DC

## 2013-06-06 MED ORDER — ACETAMINOPHEN 500 MG PO TABS
1000.0000 mg | ORAL_TABLET | Freq: Four times a day (QID) | ORAL | Status: AC
  Administered 2013-06-06 – 2013-06-07 (×4): 1000 mg via ORAL
  Filled 2013-06-06 (×4): qty 2

## 2013-06-06 MED ORDER — CEFAZOLIN SODIUM-DEXTROSE 2-3 GM-% IV SOLR
2.0000 g | INTRAVENOUS | Status: AC
  Administered 2013-06-06: 2 g via INTRAVENOUS

## 2013-06-06 MED ORDER — MEPERIDINE HCL 50 MG/ML IJ SOLN: 6.2500 mg | INTRAMUSCULAR | Status: DC | PRN

## 2013-06-06 MED ORDER — OXYCODONE HCL 5 MG PO TABS
5.0000 mg | ORAL_TABLET | ORAL | Status: DC | PRN
  Administered 2013-06-06 (×2): 10 mg via ORAL
  Administered 2013-06-06: 5 mg via ORAL
  Administered 2013-06-06 – 2013-06-08 (×7): 10 mg via ORAL
  Filled 2013-06-06 (×5): qty 2
  Filled 2013-06-06: qty 1
  Filled 2013-06-06 (×4): qty 2

## 2013-06-06 MED ORDER — LOSARTAN POTASSIUM 50 MG PO TABS
100.0000 mg | ORAL_TABLET | Freq: Every day | ORAL | Status: DC
  Administered 2013-06-07 – 2013-06-08 (×2): 100 mg via ORAL
  Filled 2013-06-06 (×3): qty 2

## 2013-06-06 MED ORDER — LOSARTAN POTASSIUM-HCTZ 100-25 MG PO TABS: 1.0000 | ORAL_TABLET | Freq: Every morning | ORAL | Status: DC

## 2013-06-06 MED ORDER — ACETAMINOPHEN 500 MG PO TABS
1000.0000 mg | ORAL_TABLET | Freq: Once | ORAL | Status: AC
  Administered 2013-06-06: 1000 mg via ORAL
  Filled 2013-06-06: qty 2

## 2013-06-06 MED ORDER — KETOROLAC TROMETHAMINE 15 MG/ML IJ SOLN: 7.5000 mg | Freq: Four times a day (QID) | INTRAMUSCULAR | Status: AC | PRN

## 2013-06-06 MED ORDER — METHOCARBAMOL 500 MG PO TABS
500.0000 mg | ORAL_TABLET | Freq: Four times a day (QID) | ORAL | Status: DC | PRN
  Administered 2013-06-06 – 2013-06-07 (×3): 500 mg via ORAL
  Filled 2013-06-06 (×3): qty 1

## 2013-06-06 MED ORDER — HYDROCHLOROTHIAZIDE 25 MG PO TABS
25.0000 mg | ORAL_TABLET | Freq: Every day | ORAL | Status: DC
  Administered 2013-06-07 – 2013-06-08 (×2): 25 mg via ORAL
  Filled 2013-06-06 (×3): qty 1

## 2013-06-06 MED ORDER — DOCUSATE SODIUM 100 MG PO CAPS
100.0000 mg | ORAL_CAPSULE | Freq: Two times a day (BID) | ORAL | Status: DC
  Administered 2013-06-06 – 2013-06-08 (×4): 100 mg via ORAL

## 2013-06-06 MED ORDER — CHLORHEXIDINE GLUCONATE 4 % EX LIQD
60.0000 mL | Freq: Once | CUTANEOUS | Status: DC
  Filled 2013-06-06: qty 60

## 2013-06-06 SURGICAL SUPPLY — 58 items
BAG SPEC THK2 15X12 ZIP CLS (MISCELLANEOUS) ×1
BAG ZIPLOCK 12X15 (MISCELLANEOUS) ×2 IMPLANT
BANDAGE ELASTIC 6 VELCRO ST LF (GAUZE/BANDAGES/DRESSINGS) ×2 IMPLANT
BANDAGE ESMARK 6X9 LF (GAUZE/BANDAGES/DRESSINGS) ×1 IMPLANT
BLADE SAG 18X100X1.27 (BLADE) ×2 IMPLANT
BLADE SAW SGTL 11.0X1.19X90.0M (BLADE) ×2 IMPLANT
BNDG CMPR 9X6 STRL LF SNTH (GAUZE/BANDAGES/DRESSINGS) ×1
BNDG ESMARK 6X9 LF (GAUZE/BANDAGES/DRESSINGS) ×2
BOWL SMART MIX CTS (DISPOSABLE) ×2 IMPLANT
CAPT RP KNEE ×2 IMPLANT
CEMENT HV SMART SET (Cement) ×4 IMPLANT
CLOSURE STERI-STRIP 1/4X4 (GAUZE/BANDAGES/DRESSINGS) ×2 IMPLANT
CLOTH BEACON ORANGE TIMEOUT ST (SAFETY) ×2 IMPLANT
CUFF TOURN SGL QUICK 34 (TOURNIQUET CUFF) ×2
CUFF TRNQT CYL 34X4X40X1 (TOURNIQUET CUFF) ×1 IMPLANT
DECANTER SPIKE VIAL GLASS SM (MISCELLANEOUS) ×2 IMPLANT
DRAPE EXTREMITY T 121X128X90 (DRAPE) ×2 IMPLANT
DRAPE POUCH INSTRU U-SHP 10X18 (DRAPES) ×2 IMPLANT
DRAPE U-SHAPE 47X51 STRL (DRAPES) ×2 IMPLANT
DRSG ADAPTIC 3X8 NADH LF (GAUZE/BANDAGES/DRESSINGS) ×2 IMPLANT
DRSG PAD ABDOMINAL 8X10 ST (GAUZE/BANDAGES/DRESSINGS) ×2 IMPLANT
DURAPREP 26ML APPLICATOR (WOUND CARE) ×2 IMPLANT
ELECT REM PT RETURN 9FT ADLT (ELECTROSURGICAL) ×2
ELECTRODE REM PT RTRN 9FT ADLT (ELECTROSURGICAL) ×1 IMPLANT
EVACUATOR 1/8 PVC DRAIN (DRAIN) ×2 IMPLANT
FACESHIELD LNG OPTICON STERILE (SAFETY) ×10 IMPLANT
GLOVE BIO SURGEON STRL SZ7.5 (GLOVE) IMPLANT
GLOVE BIO SURGEON STRL SZ8 (GLOVE) ×2 IMPLANT
GLOVE BIOGEL PI IND STRL 8 (GLOVE) ×1 IMPLANT
GLOVE BIOGEL PI INDICATOR 8 (GLOVE) ×1
GLOVE SURG SS PI 6.5 STRL IVOR (GLOVE) IMPLANT
GOWN STRL NON-REIN LRG LVL3 (GOWN DISPOSABLE) ×4 IMPLANT
GOWN STRL REIN XL XLG (GOWN DISPOSABLE) ×4 IMPLANT
HANDPIECE INTERPULSE COAX TIP (DISPOSABLE) ×2
IMMOBILIZER KNEE 20 (SOFTGOODS) ×2
IMMOBILIZER KNEE 20 THIGH 36 (SOFTGOODS) ×1 IMPLANT
KIT BASIN OR (CUSTOM PROCEDURE TRAY) ×2 IMPLANT
MANIFOLD NEPTUNE II (INSTRUMENTS) ×2 IMPLANT
NDL SAFETY ECLIPSE 18X1.5 (NEEDLE) ×2 IMPLANT
NEEDLE HYPO 18GX1.5 SHARP (NEEDLE) ×4
NS IRRIG 1000ML POUR BTL (IV SOLUTION) ×2 IMPLANT
PACK TOTAL JOINT (CUSTOM PROCEDURE TRAY) ×2 IMPLANT
PADDING CAST COTTON 6X4 STRL (CAST SUPPLIES) ×4 IMPLANT
POSITIONER SURGICAL ARM (MISCELLANEOUS) ×2 IMPLANT
SET HNDPC FAN SPRY TIP SCT (DISPOSABLE) ×1 IMPLANT
SPONGE GAUZE 4X4 12PLY (GAUZE/BANDAGES/DRESSINGS) ×2 IMPLANT
STRIP CLOSURE SKIN 1/2X4 (GAUZE/BANDAGES/DRESSINGS) ×4 IMPLANT
SUCTION FRAZIER 12FR DISP (SUCTIONS) ×2 IMPLANT
SUT MNCRL AB 4-0 PS2 18 (SUTURE) ×2 IMPLANT
SUT VIC AB 2-0 CT1 27 (SUTURE) ×6
SUT VIC AB 2-0 CT1 TAPERPNT 27 (SUTURE) ×3 IMPLANT
SUT VLOC 180 0 24IN GS25 (SUTURE) ×2 IMPLANT
SYR 20CC LL (SYRINGE) ×2 IMPLANT
SYR 50ML LL SCALE MARK (SYRINGE) ×2 IMPLANT
TOWEL OR 17X26 10 PK STRL BLUE (TOWEL DISPOSABLE) ×4 IMPLANT
TRAY FOLEY CATH 14FRSI W/METER (CATHETERS) ×2 IMPLANT
WATER STERILE IRR 1500ML POUR (IV SOLUTION) ×2 IMPLANT
WRAP KNEE MAXI GEL POST OP (GAUZE/BANDAGES/DRESSINGS) ×2 IMPLANT

## 2013-06-06 NOTE — Interval H&P Note (Signed)
History and Physical Interval Note:  06/06/2013 6:33 AM  [name]  has presented today for surgery, with the diagnosis of OA OF RIGHT KNEE  The various methods of treatment have been discussed with the patient and family. After consideration of risks, benefits and other options for treatment, the patient has consented to  Procedure(s): RIGHT TOTAL KNEE ARTHROPLASTY (Right) as a surgical intervention .  The patient's history has been reviewed, patient examined, no change in status, stable for surgery.  I have reviewed the patient's chart and labs.  Questions were answered to the patient's satisfaction.     Loanne Drilling

## 2013-06-06 NOTE — Transfer of Care (Signed)
Immediate Anesthesia Transfer of Care Note  Patient: [name]  Procedure(s) Performed: Procedure(s) (LRB): RIGHT TOTAL KNEE ARTHROPLASTY (Right)  Patient Location: PACU  Anesthesia Type: Spinal  Level of Consciousness: sedated, patient cooperative and responds to stimulaton  Airway & Oxygen Therapy: Patient Spontanous Breathing and Patient connected to face mask oxgen  Post-op Assessment: Report given to PACU RN and Post -op Vital signs reviewed and stable  Post vital signs: Reviewed and stable T-12 level on exam and release to staff.  Complications: No apparent anesthesia complications

## 2013-06-06 NOTE — Anesthesia Postprocedure Evaluation (Signed)
  Anesthesia Post-op Note  Patient: [name]  Procedure(s) Performed: Procedure(s) (LRB): RIGHT TOTAL KNEE ARTHROPLASTY (Right)  Patient Location: PACU  Anesthesia Type: Spinal  Level of Consciousness: awake and alert   Airway and Oxygen Therapy: Patient Spontanous Breathing  Post-op Pain: mild  Post-op Assessment: Post-op Vital signs reviewed, Patient's Cardiovascular Status Stable, Respiratory Function Stable, Patent Airway and No signs of Nausea or vomiting  Last Vitals:  Filed Vitals:   06/06/13 1400  BP: 155/66  Pulse: 93  Temp: 36.7 C  Resp: 16    Post-op Vital Signs: stable   Complications: No apparent anesthesia complications

## 2013-06-06 NOTE — Progress Notes (Signed)
PHARMACY BRIEF NOTE:  CITRALOPRAM (CELEXA) DOSAGE  On citalopram 40mg  daily as taken prior to admission.  Recent FDA Alert recommends limiting citalopram dosage to 20mg  daily when age > 60 due to increased risk of QTc prolongation and life-threatening arrhythmias.    Recommend: Consider risk vs. benefit of continuing present Celexa dosage.  Elie Goody, PharmD, BCPS Pager: [phone] 06/06/2013  12:39 PM   .

## 2013-06-06 NOTE — Anesthesia Preprocedure Evaluation (Addendum)
Anesthesia Evaluation  Patient identified by MRN, date of birth, ID band Patient awake    Reviewed: Allergy & Precautions, H&P , NPO status , Patient's Chart, lab work & pertinent test results  Airway Mallampati: II TM Distance: >3 FB Neck ROM: Full    Dental no notable dental hx.    Pulmonary shortness of breath, asthma , COPD breath sounds clear to auscultation  Pulmonary exam normal       Cardiovascular hypertension, Pt. on medications Rhythm:Regular Rate:Normal     Neuro/Psych negative neurological ROS  negative psych ROS   GI/Hepatic negative GI ROS, Neg liver ROS,   Endo/Other  Hypothyroidism Morbid obesity  Renal/GU negative Renal ROS  negative genitourinary   Musculoskeletal negative musculoskeletal ROS (+)   Abdominal   Peds negative pediatric ROS (+)  Hematology negative hematology ROS (+)   Anesthesia Other Findings   Reproductive/Obstetrics negative OB ROS                          Anesthesia Physical Anesthesia Plan  ASA: II  Anesthesia Plan: Spinal   Post-op Pain Management:    Induction:   Airway Management Planned: Simple Face Mask  Additional Equipment:   Intra-op Plan:   Post-operative Plan:   Informed Consent: I have reviewed the patients History and Physical, chart, labs and discussed the procedure including the risks, benefits and alternatives for the proposed anesthesia with the patient or authorized representative who has indicated his/her understanding and acceptance.   Dental advisory given  Plan Discussed with: CRNA  Anesthesia Plan Comments:         Anesthesia Quick Evaluation

## 2013-06-06 NOTE — H&P (View-Only) (Signed)
[name]  DOB: [birth year] Married / Language: English / Race: White Female  Date of Admission:  06/06/2013  Chief Complaint:  Right knee pain  History of Present Illness The patient is a 67 year old female who comes in for a preoperative History and Physical. The patient is scheduled for a right total knee arthroplasty to be performed by Dr. Gus Rankin. [name], MD at [facility] on 06/06/2013. The patient is a 67 year old female who presents with knee complaints. The patient reports right knee symptoms including: pain and weakness which began 1 year(s) ago without any known injury (Patient states that she has been having pain in her right knee. She said that it increases with prolonged standing. It seems to hurt more on the anterior medial side. She takes alot of pain medicines already for Rheumatoid Arthritis. She has had a knee replacement on the left with Dr.Olin and has continued to have pain on the lateral side of that knee. ).The patient feels that the symptoms are worsening. Prior to being seen today the patient was previously evaluated by a primary care provider. Previous work-up for this problem has included knee x-rays and knee MRI (11/09/2012). The patient's knee is getting progressively worse at this time. It is limiting what she can and cannot do. Pain is worse with weightbearing than it is at rest, but also is occurring at rest. She says the pain is similar to what she experienced prior to her left knee replacement several years ago. She had it done by Dr. Charlann Boxer. She is still having some discomfort with the knee. She has decided that she wants me to fix this knee. She is at a stage where she is ready to get the knee fixed. She has had injections, but they have not provided any benefit. They have been treated conservatively in the past for the above stated problem and despite conservative measures, they continue to have progressive pain and severe functional  limitations and dysfunction. They have failed non-operative management including home exercise, medications, and injections. It is felt that they would benefit from undergoing total joint replacement. Risks and benefits of the procedure have been discussed with the patient and they elect to proceed with surgery. There are no active contraindications to surgery such as ongoing infection or rapidly progressive neurological disease.   Problem List Primary osteoarthritis of one knee (715.16)   Allergies No Known Drug Allergies   Family History Rheumatoid Arthritis. father Osteoarthritis. father Heart Disease. father Cancer. mother Hypertension. father Heart disease in female family member before age 55   Social History Drug/Alcohol Rehab (Currently). no Current work status. working part time Drug/Alcohol Rehab (Previously). no Illicit drug use. no Exercise. Exercises rarely; does other Children. 2 Alcohol use. current drinker; drinks hard liquor; only occasionally per week Tobacco / smoke exposure. no Previously in rehab. no Tobacco use. former smoker; smoke(d) 1 1/2 pack(s) per day Marital status. married Living situation. 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 less than 1   Medication History Tylenol ( Oral) Specific dose unknown - Active. AmLODIPine Besylate (10MG  Tablet, Oral) Active. Calcium Carbonate-Vit D-Min (600-400MG -UNIT Tablet, Oral) Active. Citalopram Hydrobromide ( Oral) Specific dose unknown - Active. Embrel Active. Synthroid ( Oral) Specific dose unknown - Active. Losartan Potassium-HCTZ (100-25MG  Tablet, Oral) Active. Omeprazole ( Oral) Specific dose unknown - Active. SulfaSALAzine ( Oral) Specific dose unknown - Active. Symbicort ( Inhalation)  Specific dose unknown - Active. TraMADol HCl ( Oral) Specific dose unknown -  Active.   Past Surgical History Gallbladder Surgery. laporoscopic Cataract Surgery. bilateral Tonsillectomy Mammoplasty; Reduction. bilateral Carpal Tunnel Repair. bilateral Arthroscopy of Knee. left Appendectomy Breast Reconstruction. bilateral Breast Biopsy. left Total Knee Replacement. left   Medical History Osteoarthritis Hypothyroidism Rheumatoid Arthritis Psoriatic Arhtritis Hyperthyroidism Gastroesophageal Reflux Disease Hypercholesterolemia High blood pressure ARDS. 2006 following a bacterial pneumonia. 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 Past History - 2006 Depression Hemorrhoids   Review of Systems General:Not Present- Chills, Fever, Night Sweats, Fatigue, Weight Gain, Weight Loss and Memory Loss. Skin:Not Present- Hives, Itching, Rash, Eczema and Lesions. HEENT:Not Present- Tinnitus, Headache, Double Vision, Visual Loss, Hearing Loss and Dentures. Respiratory:Present- Shortness of breath with exertion and Wheezing. Not Present- Shortness of breath at rest, Allergies, Coughing up blood and Chronic Cough. Cardiovascular:Not Present- Chest Pain, Racing/skipping heartbeats, Difficulty Breathing Lying Down, Murmur, Swelling and Palpitations. Gastrointestinal:Not Present- Bloody Stool, Heartburn, Abdominal Pain, Vomiting, Nausea, Constipation, Diarrhea, Difficulty Swallowing, Jaundice and Loss of appetitie. Female Genitourinary:Not Present- Blood in Urine, Urinary frequency, Weak urinary stream, Discharge, Flank Pain, Incontinence, Painful Urination, Urgency, Urinary Retention and Urinating at Night. Musculoskeletal:Present- Joint Pain and Morning Stiffness. Not Present- Muscle Weakness, Muscle Pain, Joint Swelling, Back Pain and Spasms. Neurological:Not Present- Tremor, Dizziness, Blackout spells, Paralysis, Difficulty with balance and Weakness. Psychiatric:Not Present- Insomnia.   Vitals Pulse: 92 (Regular)  Resp.: 20 (Unlabored) BP: 138/68 (Sitting, Left Arm, Standard)    Physical Exam The physical exam findings are as follows:  Note: Patient is a 67 year old female with continued right knee pain. Patient is accompanied today by her husband Joe.   General Mental Status - Alert, cooperative and good historian. General Appearance- pleasant. Not in acute distress. Orientation- Oriented X3. Build & Nutrition- Well nourished and Well developed.   Head and Neck Head- normocephalic, atraumatic . Neck Global Assessment- supple. no bruit auscultated on the right and no bruit auscultated on the left.   Eye Pupil- Bilateral- Regular and Round. Motion- Bilateral- EOMI.   Chest and Lung Exam Auscultation: Breath sounds:- clear at anterior chest wall and - clear at posterior chest wall. Adventitious sounds:Inspiratory & expiratory crackles- Left Lung Field (expiratory).   Cardiovascular Auscultation:Rhythm- Regular rate and rhythm. Heart Sounds- S1 WNL and S2 WNL. Murmurs & Other Heart Sounds:Auscultation of the heart reveals - No Murmurs.   Abdomen Palpation/Percussion:Tenderness- Abdomen is non-tender to palpation. Rigidity (guarding)- Abdomen is soft. Auscultation:Auscultation of the abdomen reveals - Bowel sounds normal.   Female Genitourinary Not done, not pertinent to present illness  Musculoskeletal  Well developed female, alert and oriented in no apparent distress. Her hips show normal range of motion and no discomfort. The left knee shows no effusion. Range 0 to 115 with no tenderness or instability. The right knee slight valgus deformity. Range 5 to 125. Moderate crepitus on range of motion. Tender lateral greater than medial with no instability noted. Pulses sensation and motor are intact.  RADIOGRAPHS: AP both knees and lateral of the right show that she has a valgus deformity right knee, bone on bone lateral compartment with  patellofemoral arthritis also. The left knee prosthesis in good position with no abnormalities.  Assessment & Plan Primary osteoarthritis of one knee (715.16) Impression: Right Knee  Note: Plan is for a Right Total Knee Replacement by Dr. Lequita Halt.  Plan is to go home with her husband, Gabriel Rung.  The patient does not have any contraindications and will  recieve TXA (tranexamic acid) prior to surgery.  PCP - Dr. Amador Cunas - Patient has been seen preoperatively and felt to be stable for surgery. Pulmonary - Dr. Sherene Sires - Patient has been seen preoperatively and felt to be stable for surgery.  Time Spent - 30 minutes.  Signed electronically by Lauraine Rinne, III PA-C

## 2013-06-06 NOTE — Evaluation (Signed)
Physical Therapy Evaluation Patient Details Name: [name] MRN: [id number] DOB: June 14, 1946 Today's Date: 06/06/2013 Time: 2130-8657 PT Time Calculation (min): 25 min  PT Assessment / Plan / Recommendation History of Present Illness  S/P R TKA 8/4  Clinical Impression  On eval POD 0, pt required Min assist for mobility-able to ambulate ~25 feet with RW. Recommend HHPT. Family will be assisting pt at home.     PT Assessment  Patient needs continued PT services    Follow Up Recommendations  Home health PT    Does the patient have the potential to tolerate intense rehabilitation      Barriers to Discharge        Equipment Recommendations  None recommended by PT    Recommendations for Other Services OT consult   Frequency 7X/week    Precautions / Restrictions Precautions Precautions: Fall;Knee Restrictions Weight Bearing Restrictions: No RLE Weight Bearing: Weight bearing as tolerated   Pertinent Vitals/Pain 5/10 R knee       Mobility  Bed Mobility Bed Mobility: Supine to Sit;Sit to Supine Supine to Sit: 4: Min assist;HOB elevated Sit to Supine: 4: Min assist;HOB elevated Details for Bed Mobility Assistance: Increased time. Assist for R LE off/onto bed.  Transfers Transfers: Sit to Stand;Stand to Sit Sit to Stand: 4: Min assist;From bed Stand to Sit: 4: Min assist;To bed Details for Transfer Assistance: assist to rise, stabilize, control descent. VCs safety, technique, hand placement. Ambulation/Gait Ambulation/Gait Assistance: 4: Min assist Ambulation Distance (Feet): 25 Feet Assistive device: Rolling walker Ambulation/Gait Assistance Details: VCs safety, technique, sequence. assist to stabilize throughout ambulation. Slow gait speed. Fatigues fairly easily.Dyspnea 3/4.  Gait Pattern: Step-to pattern;Decreased stride length;Decreased step length - left;Antalgic    Exercises     PT Diagnosis: Difficulty walking;Abnormality of gait;Acute pain  PT Problem  List: Decreased strength;Decreased range of motion;Decreased activity tolerance;Decreased mobility;Obesity;Decreased knowledge of use of DME;Pain;Decreased knowledge of precautions PT Treatment Interventions: DME instruction;Gait training;Stair training;Functional mobility training;Therapeutic activities;Therapeutic exercise;Patient/family education     PT Goals(Current goals can be found in the care plan section) Acute Rehab PT Goals Patient Stated Goal: home. regain independence PT Goal Formulation: With patient/family Time For Goal Achievement: 06/13/13 Potential to Achieve Goals: Good  Visit Information  Last PT Received On: 06/06/13 Assistance Needed: +1 History of Present Illness: S/P R TKA 8/4       Prior Functioning  Home Living Family/patient expects to be discharged to:: Private residence Living Arrangements: Spouse/significant other Available Help at Discharge: Family Type of Home: House Home Access: Stairs to enter Secretary/administrator of Steps: 3 Entrance Stairs-Rails: Right;Left Home Layout: One level Home Equipment: Environmental consultant - 2 wheels;Cane - single point Prior Function Level of Independence: Independent Communication Communication: No difficulties    Cognition  Cognition Arousal/Alertness: Awake/alert Behavior During Therapy: WFL for tasks assessed/performed Overall Cognitive Status: Within Functional Limits for tasks assessed    Extremity/Trunk Assessment Upper Extremity Assessment Upper Extremity Assessment: Overall WFL for tasks assessed Lower Extremity Assessment Lower Extremity Assessment: RLE deficits/detail RLE Deficits / Details: hip flex 2/5, ,moves ankle well Cervical / Trunk Assessment Cervical / Trunk Assessment: Normal   Balance    End of Session PT - End of Session Activity Tolerance: Patient limited by fatigue Patient left: in bed;with call bell/phone within reach;with family/visitor present  GP     Rebeca Alert, MPT Pager:  [phone]

## 2013-06-06 NOTE — Op Note (Signed)
Pre-operative diagnosis- Osteoarthritis  Right knee(s)  Post-operative diagnosis- Osteoarthritis Right knee(s)  Procedure-  Right  Total Knee Arthroplasty  Surgeon- Amber Rankin. Kewan Mcnease, MD  Assistant- Dimitri Ped, PA-C   Anesthesia-  Spinal EBL-* No blood loss amount entered *  Drains Hemovac  Tourniquet time-  Total Tourniquet Time Documented: Thigh (Right) - 34 minutes Total: Thigh (Right) - 34 minutes    Complications- None  Condition-PACU - hemodynamically stable.   Brief Clinical Note  [name] is a 67 y.o. year old female with end stage OA of her right knee with progressively worsening pain and dysfunction. She has constant pain, with activity and at rest and significant functional deficits with difficulties even with ADLs. She has had extensive non-op management including analgesics, injections of cortisone and viscosupplements, and home exercise program, but remains in significant pain with significant dysfunction.Radiographs show bone on bone arthritis lateral and patellofemoral. She presents now for right Total Knee Arthroplasty.    Procedure in detail---   The patient is brought into the operating room and positioned supine on the operating table. After successful administration of  Spinal,   a tourniquet is placed high on the  Right thigh(s) and the lower extremity is prepped and draped in the usual sterile fashion. Time out is performed by the operating team and then the  Right lower extremity is wrapped in Esmarch, knee flexed and the tourniquet inflated to 300 mmHg.       A midline incision is made with a ten blade through the subcutaneous tissue to the level of the extensor mechanism. A fresh blade is used to make a medial parapatellar arthrotomy. Soft tissue over the proximal medial tibia is subperiosteally elevated to the joint line with a knife and into the semimembranosus bursa with a Cobb elevator. Soft tissue over the proximal lateral tibia is elevated with  attention being paid to avoiding the patellar tendon on the tibial tubercle. The patella is everted, knee flexed 90 degrees and the ACL and PCL are removed. Findings are bone on bone lateral and patellofemoral with lateral osteophytes.        The drill is used to create a starting hole in the distal femur and the canal is thoroughly irrigated with sterile saline to remove the fatty contents. The 5 degree Right  valgus alignment guide is placed into the femoral canal and the distal femoral cutting block is pinned to remove 10 mm off the distal femur. Resection is made with an oscillating saw.      The tibia is subluxed forward and the menisci are removed. The extramedullary alignment guide is placed referencing proximally at the medial aspect of the tibial tubercle and distally along the second metatarsal axis and tibial crest. The block is pinned to remove 2mm off the more deficient lateral  side. Resection is made with an oscillating saw. Size 2.5is the most appropriate size for the tibia and the proximal tibia is prepared with the modular drill and keel punch for that size.      The femoral sizing guide is placed and size 3 is most appropriate. Rotation is marked off the epicondylar axis and confirmed by creating a rectangular flexion gap at 90 degrees. The size 3 cutting block is pinned in this rotation and the anterior, posterior and chamfer cuts are made with the oscillating saw. The intercondylar block is then placed and that cut is made.      Trial size 2.5 tibial component, trial size 3 posterior stabilized  femur and a 10  mm posterior stabilized rotating platform insert trial is placed. Full extension is achieved with excellent varus/valgus and anterior/posterior balance throughout full range of motion. The patella is everted and thickness measured to be 22  mm. Free hand resection is taken to 12 mm, a 35 template is placed, lug holes are drilled, trial patella is placed, and it tracks normally.  Osteophytes are removed off the posterior femur with the trial in place. All trials are removed and the cut bone surfaces prepared with pulsatile lavage. Cement is mixed and once ready for implantation, the size 2.5 tibial implant, size  3 posterior stabilized femoral component, and the size 35 patella are cemented in place and the patella is held with the clamp. The trial insert is placed and the knee held in full extension. The Exparel (20 ml mixed with 30 ml saline) and .25% Bupivicaine, are injected into the extensor mechanism, posterior capsule, medial and lateral gutters and subcutaneous tissues.  All extruded cement is removed and once the cement is hard the permanent 10 mm posterior stabilized rotating platform insert is placed into the tibial tray.      The wound is copiously irrigated with saline solution and the extensor mechanism closed over a hemovac drain with #1 PDS suture. The tourniquet is released for a total tourniquet time of 33  minutes. Flexion against gravity is 140 degrees and the patella tracks normally. Subcutaneous tissue is closed with 2.0 vicryl and subcuticular with running 4.0 Monocryl. The incision is cleaned and dried and steri-strips and a bulky sterile dressing are applied. The limb is placed into a knee immobilizer and the patient is awakened and transported to recovery in stable condition.      Please note that a surgical assistant was a medical necessity for this procedure in order to perform it in a safe and expeditious manner. Surgical assistant was necessary to retract the ligaments and vital neurovascular structures to prevent injury to them and also necessary for proper positioning of the limb to allow for anatomic placement of the prosthesis.   Amber Rankin Caellum Mancil, MD    06/06/2013, 8:09 AM

## 2013-06-06 NOTE — Progress Notes (Signed)
Utilization review completed.  

## 2013-06-06 NOTE — Anesthesia Procedure Notes (Signed)
Spinal  Patient location during procedure: OR Start time: 06/06/2013 7:15 AM End time: 06/06/2013 7:20 AM Staffing CRNA/Resident: Paris Lore Performed by: resident/CRNA  Preanesthetic Checklist Completed: patient identified, site marked, surgical consent, pre-op evaluation, timeout performed, IV checked, risks and benefits discussed and monitors and equipment checked Spinal Block Patient position: sitting Prep: Betadine Patient monitoring: heart rate, continuous pulse ox and blood pressure Approach: right paramedian Location: L2-3 Injection technique: single-shot Needle Needle type: Spinocan  Needle gauge: 22 G Needle length: 9 cm Needle insertion depth: 5 cm Assessment Sensory level: T4 Additional Notes Expiration date of kit checked and confirmed. Patient tolerated procedure well, without complications. L2-L3 X 1 attempt with noted clear CSF return easy aspiration and administration of medication.

## 2013-06-07 ENCOUNTER — Encounter (HOSPITAL_COMMUNITY): Payer: Self-pay | Admitting: Orthopedic Surgery

## 2013-06-07 DIAGNOSIS — K219 Gastro-esophageal reflux disease without esophagitis: Secondary | ICD-10-CM | POA: Diagnosis present

## 2013-06-07 DIAGNOSIS — Z9981 Dependence on supplemental oxygen: Secondary | ICD-10-CM

## 2013-06-07 DIAGNOSIS — K2270 Barrett's esophagus without dysplasia: Secondary | ICD-10-CM | POA: Diagnosis present

## 2013-06-07 LAB — BASIC METABOLIC PANEL
BUN: 16 mg/dL (ref 6–23)
CO2: 34 mEq/L — ABNORMAL HIGH (ref 19–32)
Calcium: 9.5 mg/dL (ref 8.4–10.5)
Chloride: 96 mEq/L (ref 96–112)
Creatinine, Ser: 0.77 mg/dL (ref 0.50–1.10)
GFR calc Af Amer: >90 mL/min (ref >90)
GFR calc non Af Amer: 86 mL/min — ABNORMAL LOW (ref >90)
Glucose, Bld: 116 mg/dL — ABNORMAL HIGH (ref 70–99)
Potassium: 4 mEq/L (ref 3.5–5.1)
Sodium: 136 mEq/L (ref 135–145)

## 2013-06-07 LAB — CBC
HCT: 34.1 % — ABNORMAL LOW (ref 36.0–46.0)
Hemoglobin: 10.6 g/dL — ABNORMAL LOW (ref 12.0–15.0)
MCH: 29.4 pg (ref 26.0–34.0)
MCHC: 31.1 g/dL (ref 30.0–36.0)
MCV: 94.5 fL (ref 78.0–100.0)
Platelets: 180 10*3/uL (ref 150–400)
RBC: 3.61 MIL/uL — ABNORMAL LOW (ref 3.87–5.11)
RDW: 13.3 % (ref 11.5–15.5)
WBC: 11.1 10*3/uL — ABNORMAL HIGH (ref 4.0–10.5)

## 2013-06-07 MED ORDER — NON FORMULARY: 20.0000 mg | Status: DC

## 2013-06-07 MED ORDER — OMEPRAZOLE 20 MG PO CPDR
20.0000 mg | DELAYED_RELEASE_CAPSULE | Freq: Every day | ORAL | Status: DC
  Administered 2013-06-07 – 2013-06-08 (×2): 20 mg via ORAL
  Filled 2013-06-07 (×2): qty 1

## 2013-06-07 NOTE — Progress Notes (Signed)
   Subjective: 1 Day Post-Op Procedure(s) (LRB): RIGHT TOTAL KNEE ARTHROPLASTY (Right) Patient reports pain as mild and moderate.   Patient seen in rounds with Dr. Lequita Halt. Patient is well, but has had some minor complaints of pain in the knee, requiring pain medications We will start therapy today.  Plan is to go Home after hospital stay.  Objective: Vital signs in last 24 hours: Temp:  [98 F (36.7 C)-99.2 F (37.3 C)] 99.2 F (37.3 C) (08/05 0626) Pulse Rate:  [74-97] 81 (08/05 0626) Resp:  [16-20] 20 (08/05 0735) BP: (92-172)/(60-108) 172/82 mmHg (08/05 0626) SpO2:  [93 %-99 %] 99 % (08/05 0857) Weight:  [91.808 kg (202 lb 6.4 oz)] 91.808 kg (202 lb 6.4 oz) (08/04 0944)  Intake/Output from previous day:  Intake/Output Summary (Last 24 hours) at 06/07/13 0939 Last data filed at 06/07/13 1610  Gross per 24 hour  Intake 2587.5 ml  Output   4919 ml  Net -2331.5 ml    Labs:  Recent Labs  06/07/13 0500  HGB 10.6*    Recent Labs  06/07/13 0500  WBC 11.1*  RBC 3.61*  HCT 34.1*  PLT 180    Recent Labs  06/07/13 0500  NA 136  K 4.0  CL 96  CO2 34*  BUN 16  CREATININE 0.77  GLUCOSE 116*  CALCIUM 9.5   No results found for this basename: LABPT, INR,  in the last 72 hours  EXAM General - Patient is Alert, Appropriate and Oriented Extremity - Neurovascular intact Sensation intact distally Dorsiflexion/Plantar flexion intact Dressing - dressing C/D/I Motor Function - intact, moving foot and toes well on exam.  Hemovac pulled without difficulty.  Past Medical History  Diagnosis Date  . Psoriatic arthritis   . Rheumatic disease   . Hypertension   . Hypothyroidism   . Barrett esophagus   . COPD (chronic obstructive pulmonary disease)   . Chronic respiratory failure   . Pulmonary fibrosis   . GERD (gastroesophageal reflux disease)   . Shortness of breath     WITH EXERTION  . History of home oxygen therapy     AT NIGHT WHEN SLEEPING 2 L / MIN NASAL  CANNULA  . History of ARDS 2006  . Pneumococcal pneumonia 2006    HOSPITALIZED AND DEVELOPED ARDS  . Arthritis     OA RIGHT KNEE WITH PAIN    Assessment/Plan: 1 Day Post-Op Procedure(s) (LRB): RIGHT TOTAL KNEE ARTHROPLASTY (Right) Principal Problem:   OA (osteoarthritis) of knee Active Problems:   HYPOTHYROIDISM - resumed home medication   HYPERTENSION - resumed home medication   History of home oxygen therapy - resumed oxygen at night and may use during therapy if needed   GERD (gastroesophageal reflux disease) - resumed home medication   Barrett's esophagus - resumed home medication  Estimated body mass index is 40.21 kg/(m^2) as calculated from the following:   Height as of this encounter: 4' 11.5" (1.511 m).   Weight as of this encounter: 91.808 kg (202 lb 6.4 oz). Advance diet Up with therapy Plan for discharge tomorrow Discharge home with home health  DVT Prophylaxis - Xarelto Weight-Bearing as tolerated to right leg No vaccines. D/C O2 and Pulse OX and try on Room [address]  Patrica Duel 06/07/2013, 9:39 AM

## 2013-06-07 NOTE — Progress Notes (Signed)
Physical Therapy Treatment Patient Details Name: [name] MRN: [id number] DOB: [birth year] Today's Date: 06/07/2013 Time: 4098-1191 PT Time Calculation (min): 26 min  PT Assessment / Plan / Recommendation  History of Present Illness S/P R TKA 8/4   PT Comments   POD # 1 pm session.  Assisted pt OOB to amb second time then back to bed for CPM.  Pt felt bad after am session. Feeling better PM after a "long nap" stated dghtr.   Follow Up Recommendations  Home health PT     Does the patient have the potential to tolerate intense rehabilitation     Barriers to Discharge        Equipment Recommendations  None recommended by PT    Recommendations for Other Services    Frequency 7X/week   Progress towards PT Goals Progress towards PT goals: Progressing toward goals  Plan      Precautions / Restrictions Precautions Precautions: Fall;Knee Precaution Comments: Instructed pt on KI use and when to D/C Required Braces or Orthoses: Knee Immobilizer - Right Restrictions Weight Bearing Restrictions: No RLE Weight Bearing: Weight bearing as tolerated    Pertinent Vitals/Pain C/o 6/10  ICE applied    Mobility  Bed Mobility Bed Mobility: Supine to Sit;Sit to Supine Supine to Sit: 4: Min assist Sit to Supine: 4: Min assist Details for Bed Mobility Assistance: Increased time. Assist for R LE off/onto bed.  Transfers Transfers: Sit to Stand;Stand to Sit Sit to Stand: 4: Min assist;From bed;4: Min guard Stand to Sit: 4: Min assist;To bed;To chair/3-in-1;4: Min guard Details for Transfer Assistance: 25% VC's on proper tech and hand placement Ambulation/Gait Ambulation/Gait Assistance: 4: Min assist Ambulation Distance (Feet): 52 Feet Assistive device: Rolling walker Ambulation/Gait Assistance Details: Amb on 2 lts O2 with increased time  Gait Pattern: Step-to pattern;Decreased stride length;Decreased step length - left;Antalgic Gait velocity: decreased     PT Goals (current  goals can now be found in the care plan section)    Visit Information  Last PT Received On: 06/07/13 Assistance Needed: +1 History of Present Illness: S/P R TKA 8/4    Subjective Data      Cognition       Balance     End of Session PT - End of Session Equipment Utilized During Treatment: Gait belt;Right knee immobilizer Activity Tolerance: Patient tolerated treatment well Patient left: in bed;with family/visitor present;with call bell/phone within reach   Felecia Shelling  PTA [facility]  Acute  Rehab Pager      [phone]

## 2013-06-07 NOTE — Progress Notes (Signed)
OT Cancellation Note  Patient Details Name: [name] MRN: [id number] DOB: 1946-01-14   Cancelled Treatment:    Reason Eval/Treat Not Completed: Other (comment) (Pt requests later. Fatigued after PT)  Lennox Laity [phone] 06/07/2013, 11:32 AM

## 2013-06-07 NOTE — Progress Notes (Signed)
Physical Therapy Treatment Patient Details Name: [name] MRN: [id number] DOB: July 02, 1946 Today's Date: 06/07/2013 Time: 8469-6295 PT Time Calculation (min): 31 min  PT Assessment / Plan / Recommendation  History of Present Illness S/P R TKA 8/4   PT Comments   POD # 1 am session.  Instructed pt on KI use and proper application.  Assisted pt OOB to amb in hallway.  Amb on 2 lts O2 (see below).    Follow Up Recommendations  Home health PT     Does the patient have the potential to tolerate intense rehabilitation     Barriers to Discharge        Equipment Recommendations  None recommended by PT    Recommendations for Other Services    Frequency 7X/week   Progress towards PT Goals Progress towards PT goals: Progressing toward goals  Plan      Precautions / Restrictions Precautions Precautions: Fall;Knee Precaution Comments: Instructed pt on KI use and when to D/C Required Braces or Orthoses: Knee Immobilizer - Right Restrictions Weight Bearing Restrictions: No RLE Weight Bearing: Weight bearing as tolerated    Pertinent Vitals/Pain C/o 5/10 R knee pain ICE applied    Mobility  Bed Mobility Bed Mobility: Supine to Sit Supine to Sit: 4: Min assist;HOB elevated Details for Bed Mobility Assistance: Increased time. Assist for R LE off/onto bed.  Transfers Transfers: Sit to Stand;Stand to Sit Sit to Stand: 4: Min assist;From bed;4: Min guard Stand to Sit: 4: Min assist;To bed;To chair/3-in-1;4: Min guard Details for Transfer Assistance: 25% VC's on proper tech and hand placement Ambulation/Gait Ambulation/Gait Assistance: 4: Min assist Ambulation Distance (Feet): 46 Feet Assistive device: Rolling walker Ambulation/Gait Assistance Details: Amb on 2 lts O2 sats avg 90%.  VC's on purse lip breathing.  50% VC's on proper sequencing and proper walker to self distance. Gait Pattern: Step-to pattern;Decreased stride length;Decreased step length - left;Antalgic Gait  velocity: decreased     PT Goals (current goals can now be found in the care plan section)    Visit Information  Last PT Received On: 06/07/13 Assistance Needed: +1 History of Present Illness: S/P R TKA 8/4    Subjective Data      Cognition       Balance     End of Session PT - End of Session Equipment Utilized During Treatment: Gait belt;Right knee immobilizer Activity Tolerance: Patient tolerated treatment well Patient left: in chair;with call bell/phone within reach;with family/visitor present   Felecia Shelling  PTA [facility]  Acute  Rehab Pager      [phone]

## 2013-06-08 ENCOUNTER — Other Ambulatory Visit: Payer: Self-pay

## 2013-06-08 DIAGNOSIS — D62 Acute posthemorrhagic anemia: Secondary | ICD-10-CM

## 2013-06-08 LAB — BASIC METABOLIC PANEL
BUN: 15 mg/dL (ref 6–23)
CO2: 35 mEq/L — ABNORMAL HIGH (ref 19–32)
Calcium: 9.9 mg/dL (ref 8.4–10.5)
Chloride: 94 mEq/L — ABNORMAL LOW (ref 96–112)
Creatinine, Ser: 0.71 mg/dL (ref 0.50–1.10)
GFR calc Af Amer: >90 mL/min (ref >90)
GFR calc non Af Amer: 88 mL/min — ABNORMAL LOW (ref >90)
Glucose, Bld: 115 mg/dL — ABNORMAL HIGH (ref 70–99)
Potassium: 3.7 mEq/L (ref 3.5–5.1)
Sodium: 135 mEq/L (ref 135–145)

## 2013-06-08 LAB — CBC
HCT: 31.2 % — ABNORMAL LOW (ref 36.0–46.0)
Hemoglobin: 10 g/dL — ABNORMAL LOW (ref 12.0–15.0)
MCH: 29.9 pg (ref 26.0–34.0)
MCHC: 32.1 g/dL (ref 30.0–36.0)
MCV: 93.1 fL (ref 78.0–100.0)
Platelets: 184 10*3/uL (ref 150–400)
RBC: 3.35 MIL/uL — ABNORMAL LOW (ref 3.87–5.11)
RDW: 13.4 % (ref 11.5–15.5)
WBC: 12 10*3/uL — ABNORMAL HIGH (ref 4.0–10.5)

## 2013-06-08 MED ORDER — RIVAROXABAN 10 MG PO TABS: 10.0000 mg | ORAL_TABLET | Freq: Every day | ORAL | Status: DC

## 2013-06-08 MED ORDER — OXYCODONE HCL 5 MG PO TABS: 5.0000 mg | ORAL_TABLET | ORAL | Status: DC | PRN

## 2013-06-08 MED ORDER — METHOCARBAMOL 500 MG PO TABS: 500.0000 mg | ORAL_TABLET | Freq: Four times a day (QID) | ORAL | Status: DC | PRN

## 2013-06-08 NOTE — Care Management Note (Signed)
    Page 1 of 1   06/08/2013     12:50:21 PM   CARE MANAGEMENT NOTE 06/08/2013  Patient:  [name], [name]   Account Number:  [id number]  Date Initiated:  06/08/2013  Documentation initiated by:  Colleen Can  Subjective/Objective Assessment:   dx OA rt knee; total knee replacemnt     Action/Plan:   CM spoke with patient. Plans are for her to return to her home where spouse and daughter will be caregivers. She already has RW and wishes to use [place] for [facility] services.   Anticipated DC Date:  06/08/2013   Anticipated DC Plan:  HOME W HOME HEALTH SERVICES      DC Planning Services  CM consult      [facility] Choice  HOME HEALTH   Choice offered to / List presented to:  C-1 Patient        HH arranged  HH-2 PT      [facility] agency  [facility]   Status of service:  Completed, signed off Medicare Important Message given?   (If response is "NO", the following Medicare IM given date fields will be blank) Date Medicare IM given:   Date Additional Medicare IM given:    Discharge Disposition:  HOME W HOME HEALTH SERVICES  Per UR Regulation:    If discussed at Long Length of Stay Meetings, dates discussed:    Comments:

## 2013-06-08 NOTE — Progress Notes (Signed)
Physical Therapy Treatment Patient Details Name: [name] MRN: [id number] DOB: [birth year] Today's Date: 06/08/2013 Time: 1134-1200 PT Time Calculation (min): 26 min  PT Assessment / Plan / Recommendation  History of Present Illness S/P R TKA 8/4   PT Comments   POD # 2 am session.  Practiced steps with daughter.  Amb in hallway.  Pt ready for D/C.  Follow Up Recommendations        Does the patient have the potential to tolerate intense rehabilitation     Barriers to Discharge        Equipment Recommendations       Recommendations for Other Services    Frequency     Progress towards PT Goals    Plan      Precautions / Restrictions Precautions Precautions: Fall;Knee Precaution Comments: Instructed pt on KI use and when to D/C Required Braces or Orthoses: Knee Immobilizer - Right Restrictions Weight Bearing Restrictions: No RLE Weight Bearing: Weight bearing as tolerated    Pertinent Vitals/Pain C/o 5/10 with steps applied ICE    Mobility  Bed Mobility Bed Mobility: Not assessed Supine to Sit: 5: Supervision;HOB elevated Details for Bed Mobility Assistance: Pt OOB in recliner Transfers Transfers: Sit to Stand;Stand to Sit Sit to Stand: 5: Supervision;From chair/3-in-1 Stand to Sit: 5: Supervision;To chair/3-in-1 Details for Transfer Assistance: One verbal cues for hand placement and safety Ambulation/Gait Ambulation/Gait Assistance: 4: Min guard;5: Supervision Ambulation Distance (Feet): 48 Feet Assistive device: Rolling walker Ambulation/Gait Assistance Details: Amb on 2 lts O2 with increased time and <25% VC's on safety with turns Gait Pattern: Step-to pattern;Decreased stride length;Decreased step length - left;Antalgic Gait velocity: decreased Stairs: Yes Stairs Assistance: 4: Min guard Stairs Assistance Details (indicate cue type and reason): with daughter Stair Management Technique: One rail Right;With crutches;Forwards Number of Stairs: 4    PT  Goals (current goals can now be found in the care plan section) Acute Rehab PT Goals Patient Stated Goal: home. regain independence  Visit Information  Last PT Received On: 06/08/13 Assistance Needed: +1 History of Present Illness: S/P R TKA 8/4    Subjective Data  Patient Stated Goal: home. regain independence   Cognition  Cognition Arousal/Alertness: Awake/alert Behavior During Therapy: WFL for tasks assessed/performed Overall Cognitive Status: Within Functional Limits for tasks assessed    Balance  Balance Balance Assessed: Yes Dynamic Standing Balance Dynamic Standing - Level of Assistance: 5: Stand by assistance  End of Session     Felecia Shelling  PTA [facility]  Acute  Rehab Pager      [phone]

## 2013-06-08 NOTE — Discharge Summary (Signed)
Physician Discharge Summary   Patient ID: [name] MRN: [id number] DOB/AGE: [birth year] 67 y.o.  Admit date: 06/06/2013 Discharge date: 06/08/2013  Primary Diagnosis:  Osteoarthritis Right knee  Admission Diagnoses:  Past Medical History  Diagnosis Date  . Psoriatic arthritis   . Rheumatic disease   . Hypertension   . Hypothyroidism   . Barrett esophagus   . COPD (chronic obstructive pulmonary disease)   . Chronic respiratory failure   . Pulmonary fibrosis   . GERD (gastroesophageal reflux disease)   . Shortness of breath     WITH EXERTION  . History of home oxygen therapy     AT NIGHT WHEN SLEEPING 2 L / MIN NASAL CANNULA  . History of ARDS 2006  . Pneumococcal pneumonia 2006    HOSPITALIZED AND DEVELOPED ARDS  . Arthritis     OA RIGHT KNEE WITH PAIN   Discharge Diagnoses:   Principal Problem:   OA (osteoarthritis) of knee Active Problems:   HYPOTHYROIDISM   HYPERTENSION   History of home oxygen therapy   GERD (gastroesophageal reflux disease)   Barrett's esophagus   Postoperative anemia due to acute blood loss  Estimated body mass index is 40.21 kg/(m^2) as calculated from the following:   Height as of this encounter: 4' 11.5" (1.511 m).   Weight as of this encounter: 91.808 kg (202 lb 6.4 oz).  Procedure:  Procedure(s) (LRB): RIGHT TOTAL KNEE ARTHROPLASTY (Right)   Consults: None  HPI: [name] is a 67 y.o. year old female with end stage OA of her right knee with progressively worsening pain and dysfunction. She has constant pain, with activity and at rest and significant functional deficits with difficulties even with ADLs. She has had extensive non-op management including analgesics, injections of cortisone and viscosupplements, and home exercise program, but remains in significant pain with significant dysfunction.Radiographs show bone on bone arthritis lateral and patellofemoral. She presents now for right Total Knee Arthroplasty.   Laboratory  Data: Admission on 06/06/2013, Discharged on 06/08/2013  Component Date Value Range Status  . WBC 06/07/2013 11.1* 4.0 - 10.5 K/uL Final  . RBC 06/07/2013 3.61* 3.87 - 5.11 MIL/uL Final  . Hemoglobin 06/07/2013 10.6* 12.0 - 15.0 g/dL Final  . HCT 08/65/7846 34.1* 36.0 - 46.0 % Final  . MCV 06/07/2013 94.5  78.0 - 100.0 fL Final  . MCH 06/07/2013 29.4  26.0 - 34.0 pg Final  . MCHC 06/07/2013 31.1  30.0 - 36.0 g/dL Final  . RDW 96/29/5284 13.3  11.5 - 15.5 % Final  . Platelets 06/07/2013 180  150 - 400 K/uL Final  . Sodium 06/07/2013 136  135 - 145 mEq/L Final  . Potassium 06/07/2013 4.0  3.5 - 5.1 mEq/L Final  . Chloride 06/07/2013 96  96 - 112 mEq/L Final  . CO2 06/07/2013 34* 19 - 32 mEq/L Final  . Glucose, Bld 06/07/2013 116* 70 - 99 mg/dL Final  . BUN 13/24/4010 16  6 - 23 mg/dL Final  . Creatinine, Ser 06/07/2013 0.77  0.50 - 1.10 mg/dL Final  . Calcium 27/25/3664 9.5  8.4 - 10.5 mg/dL Final  . GFR calc non Af Amer 06/07/2013 86* >90 mL/min Final  . GFR calc Af Amer 06/07/2013 >90  >90 mL/min Final   Comment:                                 The eGFR has been calculated  using the CKD EPI equation.                          This calculation has not been                          validated in all clinical                          situations.                          eGFR's persistently                          <90 mL/min signify                          possible Chronic Kidney Disease.  . WBC 06/08/2013 12.0* 4.0 - 10.5 K/uL Final  . RBC 06/08/2013 3.35* 3.87 - 5.11 MIL/uL Final  . Hemoglobin 06/08/2013 10.0* 12.0 - 15.0 g/dL Final  . HCT 78/29/5621 31.2* 36.0 - 46.0 % Final  . MCV 06/08/2013 93.1  78.0 - 100.0 fL Final  . MCH 06/08/2013 29.9  26.0 - 34.0 pg Final  . MCHC 06/08/2013 32.1  30.0 - 36.0 g/dL Final  . RDW 30/86/5784 13.4  11.5 - 15.5 % Final  . Platelets 06/08/2013 184  150 - 400 K/uL Final  . Sodium 06/08/2013 135  135 - 145 mEq/L Final   . Potassium 06/08/2013 3.7  3.5 - 5.1 mEq/L Final  . Chloride 06/08/2013 94* 96 - 112 mEq/L Final  . CO2 06/08/2013 35* 19 - 32 mEq/L Final  . Glucose, Bld 06/08/2013 115* 70 - 99 mg/dL Final  . BUN 69/62/9528 15  6 - 23 mg/dL Final  . Creatinine, Ser 06/08/2013 0.71  0.50 - 1.10 mg/dL Final  . Calcium 41/32/4401 9.9  8.4 - 10.5 mg/dL Final  . GFR calc non Af Amer 06/08/2013 88* >90 mL/min Final  . GFR calc Af Amer 06/08/2013 >90  >90 mL/min Final   Comment:                                 The eGFR has been calculated                          using the CKD EPI equation.                          This calculation has not been                          validated in all clinical                          situations.                          eGFR's persistently                          <90 mL/min signify  possible Chronic Kidney Disease.  Hospital Outpatient Visit on 05/31/2013  Component Date Value Range Status  . MRSA, PCR 05/31/2013 NEGATIVE  NEGATIVE Final  . Staphylococcus aureus 05/31/2013 POSITIVE* NEGATIVE Final   Comment:                                 The Xpert SA Assay (FDA                          approved for NASAL specimens                          in patients over 105 years of age),                          is one component of                          a comprehensive surveillance                          program.  Test performance has                          been validated by [employer] for patients greater                          than or equal to 23 year old.                          It is not intended                          to diagnose infection nor to                          guide or monitor treatment.  Marland Kitchen aPTT 05/31/2013 28  24 - 37 seconds Final  . WBC 05/31/2013 9.1  4.0 - 10.5 K/uL Final  . RBC 05/31/2013 4.01  3.87 - 5.11 MIL/uL Final  . Hemoglobin 05/31/2013 11.8* 12.0 - 15.0 g/dL Final  . HCT 32/44/0102  37.9  36.0 - 46.0 % Final  . MCV 05/31/2013 94.5  78.0 - 100.0 fL Final  . MCH 05/31/2013 29.4  26.0 - 34.0 pg Final  . MCHC 05/31/2013 31.1  30.0 - 36.0 g/dL Final  . RDW 72/53/6644 13.4  11.5 - 15.5 % Final  . Platelets 05/31/2013 213  150 - 400 K/uL Final  . Sodium 05/31/2013 137  135 - 145 mEq/L Final  . Potassium 05/31/2013 4.1  3.5 - 5.1 mEq/L Final  . Chloride 05/31/2013 100  96 - 112 mEq/L Final  . CO2 05/31/2013 30  19 - 32 mEq/L Final  . Glucose, Bld 05/31/2013 104* 70 - 99 mg/dL Final  . BUN 03/47/4259 28* 6 - 23 mg/dL Final  . Creatinine, Ser 05/31/2013 0.92  0.50 - 1.10 mg/dL Final  . Calcium 56/38/7564 9.5  8.4 - 10.5 mg/dL Final  . Total  Protein 05/31/2013 7.4  6.0 - 8.3 g/dL Final  . Albumin 16/08/9603 3.5  3.5 - 5.2 g/dL Final  . AST 54/07/8118 27  0 - 37 U/L Final  . ALT 05/31/2013 24  0 - 35 U/L Final  . Alkaline Phosphatase 05/31/2013 62  39 - 117 U/L Final  . Total Bilirubin 05/31/2013 0.2* 0.3 - 1.2 mg/dL Final  . GFR calc non Af Amer 05/31/2013 63* >90 mL/min Final  . GFR calc Af Amer 05/31/2013 74* >90 mL/min Final   Comment:                                 The eGFR has been calculated                          using the CKD EPI equation.                          This calculation has not been                          validated in all clinical                          situations.                          eGFR's persistently                          <90 mL/min signify                          possible Chronic Kidney Disease.  Marland Kitchen Prothrombin Time 05/31/2013 12.0  11.6 - 15.2 seconds Final  . INR 05/31/2013 0.90  0.00 - 1.49 Final  . ABO/RH(D) 05/31/2013 O POS   Final  . Antibody Screen 05/31/2013 NEG   Final  . Sample Expiration 05/31/2013 06/09/2013   Final  . Color, Urine 05/31/2013 YELLOW  YELLOW Final  . APPearance 05/31/2013 CLEAR  CLEAR Final  . Specific Gravity, Urine 05/31/2013 1.027  1.005 - 1.030 Final  . pH 05/31/2013 5.0  5.0 - 8.0 Final  .  Glucose, UA 05/31/2013 NEGATIVE  NEGATIVE mg/dL Final  . Hgb urine dipstick 05/31/2013 NEGATIVE  NEGATIVE Final  . Bilirubin Urine 05/31/2013 SMALL* NEGATIVE Final  . Ketones, ur 05/31/2013 NEGATIVE  NEGATIVE mg/dL Final  . Protein, ur 14/78/2956 30* NEGATIVE mg/dL Final  . Urobilinogen, UA 05/31/2013 0.2  0.0 - 1.0 mg/dL Final  . Nitrite 21/30/8657 NEGATIVE  NEGATIVE Final  . Leukocytes, UA 05/31/2013 NEGATIVE  NEGATIVE Final  . Squamous Epithelial / LPF 05/31/2013 FEW* RARE Final  . Bacteria, UA 05/31/2013 FEW* RARE Final  . Daryll Drown 05/31/2013 MUCOUS PRESENT   Final     X-Rays:No results found.  EKG: Orders placed during the hospital encounter of 06/06/13  . EKG     Hospital Course: [name] is a 67 y.o. who was admitted to [facility]. They were brought to the operating room on 06/06/2013 and underwent Procedure(s): RIGHT TOTAL KNEE ARTHROPLASTY.  Patient tolerated the procedure well and was later transferred to the recovery room and then to the orthopaedic floor for postoperative care.  They  were given PO and IV analgesics for pain control following their surgery.  They were given 24 hours of postoperative antibiotics of  Anti-infectives   Start     Dose/Rate Route Frequency Ordered Stop   06/06/13 1315  ceFAZolin (ANCEF) IVPB 1 g/50 mL premix     1 g 100 mL/hr over 30 Minutes Intravenous Every 6 hours 06/06/13 0948 06/06/13 1909   06/06/13 0600  ceFAZolin (ANCEF) IVPB 2 g/50 mL premix     2 g 100 mL/hr over 30 Minutes Intravenous On call to O.R. 06/06/13 5784 06/06/13 0720     and started on DVT prophylaxis in the form of Xarelto.   PT and OT were ordered for total joint protocol.  Discharge planning consulted to help with postop disposition and equipment needs.  Patient had a gopod night on the evening of surgery.  They started to get up OOB with therapy on day one. Hemovac drain was pulled without difficulty.  Continued to work with therapy into day two.   Dressing was changed on day two and the incision was healing well. Patient was seen in rounds and was ready to go home.   Discharge Medications: Prior to Admission medications   Medication Sig Start Date End Date Taking? Authorizing Provider  acetaminophen (TYLENOL ARTHRITIS PAIN) 650 MG CR tablet Take 1,300 mg by mouth every 8 (eight) hours. Per bottle directions as needed   Yes Historical Provider, MD  amLODipine (NORVASC) 10 MG tablet Take 10 mg by mouth every morning.   Yes Historical Provider, MD  budesonide-formoterol (SYMBICORT) 160-4.5 MCG/ACT inhaler Inhale 2 puffs into the lungs 2 (two) times daily.   Yes Historical Provider, MD  citalopram (CELEXA) 40 MG tablet Take 40 mg by mouth every morning.   Yes Historical Provider, MD  levothyroxine (SYNTHROID, LEVOTHROID) 200 MCG tablet Take 200 mcg by mouth daily before breakfast.   Yes Historical Provider, MD  losartan-hydrochlorothiazide (HYZAAR) 100-25 MG per tablet Take 1 tablet by mouth every morning.   Yes Historical Provider, MD  omeprazole (PRILOSEC) 20 MG capsule Take 20 mg by mouth every morning.   Yes Historical Provider, MD  sulfaSALAzine (AZULFIDINE) 500 MG tablet Take 1,000 mg by mouth every morning.   Yes Historical Provider, MD  traMADol (ULTRAM) 50 MG tablet Take 50-100 mg by mouth 3 (three) times daily with meals. Take one tablet in the am, and 2 tablets at lunch, and 1 tablet with supper   Yes Historical Provider, MD  triamcinolone cream (KENALOG) 0.1 % Apply 1 application topically 2 (two) times daily as needed (for psorisis).   Yes Historical Provider, MD  albuterol (PROVENTIL) (2.5 MG/3ML) 0.083% nebulizer solution Take 2.5 mg by nebulization every 6 (six) hours as needed for wheezing. 08/17/12   Gordy Savers, MD  methocarbamol (ROBAXIN) 500 MG tablet Take 1 tablet (500 mg total) by mouth every 6 (six) hours as needed. 06/08/13   Alexzandrew Perkins, PA-C  oxyCODONE (OXY IR/ROXICODONE) 5 MG immediate release tablet  Take 1-2 tablets (5-10 mg total) by mouth every 3 (three) hours as needed. 06/08/13   Alexzandrew Julien Girt, PA-C  rivaroxaban (XARELTO) 10 MG TABS tablet Take 1 tablet (10 mg total) by mouth daily with breakfast. Take Xarelto for two and a half more weeks, then discontinue Xarelto. Once the patient has completed the blood thinner regimen, then take a Baby 81 mg Aspirin daily for four more weeks. 06/08/13   Alexzandrew Julien Girt, PA-C    Diet: Cardiac diet Activity:WBAT Follow-up:in 2 weeks  Disposition - Home Discharged Condition: good       Discharge Orders   Future Appointments Provider Department Dept Phone   08/08/2013 9:00 AM [facility] [phone]   08/08/2013 10:00 AM Nyoka Cowden, MD [facility] [phone]   Future Orders Complete By Expires   Call MD / Call 911  As directed    Comments:     If you experience chest pain or shortness of breath, CALL 911 and be transported to the hospital emergency room.  If you develope a fever above 101 F, pus (white drainage) or increased drainage or redness at the wound, or calf pain, call your surgeon's office.   Change dressing  As directed    Comments:     Change dressing daily with sterile 4 x 4 inch gauze dressing and apply TED hose. Do not submerge the incision under water.   Constipation Prevention  As directed    Comments:     Drink plenty of fluids.  Prune juice may be helpful.  You may use a stool softener, such as Colace (over the counter) 100 mg twice a day.  Use MiraLax (over the counter) for constipation as needed.   Diet - low sodium heart healthy  As directed    Discharge instructions  As directed    Comments:     Pick up stool softner and laxative for home. Do not submerge incision under water. May shower. Continue to use ice for pain and swelling from surgery. Take Xarelto for two and a half more weeks, then discontinue Xarelto. Once the patient has completed the blood thinner  regimen, then take a Baby 81 mg Aspirin daily for four more weeks.   Do not put a pillow under the knee. Place it under the heel.  As directed    Do not sit on low chairs, stoools or toilet seats, as it may be difficult to get up from low surfaces  As directed    Driving restrictions  As directed    Comments:     No driving until released by the physician.   Increase activity slowly as tolerated  As directed    Lifting restrictions  As directed    Comments:     No lifting until released by the physician.   Patient may shower  As directed    Comments:     You may shower without a dressing once there is no drainage.  Do not wash over the wound.  If drainage remains, do not shower until drainage stops.   TED hose  As directed    Comments:     Use stockings (TED hose) for 3 weeks on both leg(s).  You may remove them at night for sleeping.   Weight bearing as tolerated  As directed        Medication List    STOP taking these medications       calcium carbonate 600 MG Tabs tablet  Commonly known as:  OS-CAL     etanercept 50 MG/ML injection  Commonly known as:  ENBREL     HYDROcodone-acetaminophen 10-325 MG per tablet  Commonly known as:  NORCO     meloxicam 7.5 MG tablet  Commonly known as:  MOBIC     multivitamin with minerals Tabs tablet     Vitamin D 2000 UNITS Caps      TAKE these medications       albuterol (2.5 MG/3ML) 0.083% nebulizer solution  Commonly  known as:  PROVENTIL  Take 2.5 mg by nebulization every 6 (six) hours as needed for wheezing.     amLODipine 10 MG tablet  Commonly known as:  NORVASC  Take 10 mg by mouth every morning.     budesonide-formoterol 160-4.5 MCG/ACT inhaler  Commonly known as:  SYMBICORT  Inhale 2 puffs into the lungs 2 (two) times daily.     citalopram 40 MG tablet  Commonly known as:  CELEXA  Take 40 mg by mouth every morning.     levothyroxine 200 MCG tablet  Commonly known as:  SYNTHROID, LEVOTHROID  Take 200 mcg by mouth  daily before breakfast.     losartan-hydrochlorothiazide 100-25 MG per tablet  Commonly known as:  HYZAAR  Take 1 tablet by mouth every morning.     methocarbamol 500 MG tablet  Commonly known as:  ROBAXIN  Take 1 tablet (500 mg total) by mouth every 6 (six) hours as needed.     omeprazole 20 MG capsule  Commonly known as:  PRILOSEC  Take 20 mg by mouth every morning.     oxyCODONE 5 MG immediate release tablet  Commonly known as:  Oxy IR/ROXICODONE  Take 1-2 tablets (5-10 mg total) by mouth every 3 (three) hours as needed.     rivaroxaban 10 MG Tabs tablet  Commonly known as:  XARELTO  - Take 1 tablet (10 mg total) by mouth daily with breakfast. Take Xarelto for two and a half more weeks, then discontinue Xarelto.  - Once the patient has completed the blood thinner regimen, then take a Baby 81 mg Aspirin daily for four more weeks.     sulfaSALAzine 500 MG tablet  Commonly known as:  AZULFIDINE  Take 1,000 mg by mouth every morning.     traMADol 50 MG tablet  Commonly known as:  ULTRAM  Take 50-100 mg by mouth 3 (three) times daily with meals. Take one tablet in the am, and 2 tablets at lunch, and 1 tablet with supper     triamcinolone cream 0.1 %  Commonly known as:  KENALOG  Apply 1 application topically 2 (two) times daily as needed (for psorisis).     TYLENOL ARTHRITIS PAIN 650 MG CR tablet  Generic drug:  acetaminophen  Take 1,300 mg by mouth every 8 (eight) hours. Per bottle directions as needed       Follow-up Information   Follow up with Loanne Drilling, MD. Schedule an appointment as soon as possible for a visit on 06/21/2013.   Specialty:  Orthopedic Surgery   Contact information:   [address] [place] [address] [phone]       Signed: Patrica Duel 06/16/2013, 9:03 AM

## 2013-06-08 NOTE — Progress Notes (Signed)
   Subjective: 2 Days Post-Op Procedure(s) (LRB): RIGHT TOTAL KNEE ARTHROPLASTY (Right) Patient reports pain as mild.   Patient seen in rounds with Dr. Lequita Halt. Patient is well, and has had no acute complaints or problems Patient is ready to go home.  Objective: Vital signs in last 24 hours: Temp:  [98.3 F (36.8 C)-99.5 F (37.5 C)] 99.2 F (37.3 C) (08/06 0446) Pulse Rate:  [90-107] 95 (08/06 0446) Resp:  [16-20] 20 (08/06 0446) BP: (133-170)/(73-87) 133/86 mmHg (08/06 0446) SpO2:  [92 %-99 %] 98 % (08/06 0446)  Intake/Output from previous day:  Intake/Output Summary (Last 24 hours) at 06/08/13 0803 Last data filed at 06/08/13 0446  Gross per 24 hour  Intake   1169 ml  Output   3350 ml  Net  -2181 ml    Intake/Output this shift:    Labs:  Recent Labs  06/07/13 0500 06/08/13 0440  HGB 10.6* 10.0*    Recent Labs  06/07/13 0500 06/08/13 0440  WBC 11.1* 12.0*  RBC 3.61* 3.35*  HCT 34.1* 31.2*  PLT 180 184    Recent Labs  06/07/13 0500 06/08/13 0440  NA 136 135  K 4.0 3.7  CL 96 94*  CO2 34* 35*  BUN 16 15  CREATININE 0.77 0.71  GLUCOSE 116* 115*  CALCIUM 9.5 9.9   No results found for this basename: LABPT, INR,  in the last 72 hours  EXAM: General - Patient is Alert Extremity - Neurovascular intact Sensation intact distally Dorsiflexion/Plantar flexion intact No cellulitis present Incision - clean, dry, no drainage, healing Motor Function - intact, moving foot and toes well on exam.   Assessment/Plan: 2 Days Post-Op Procedure(s) (LRB): RIGHT TOTAL KNEE ARTHROPLASTY (Right) Procedure(s) (LRB): RIGHT TOTAL KNEE ARTHROPLASTY (Right) Past Medical History  Diagnosis Date  . Psoriatic arthritis   . Rheumatic disease   . Hypertension   . Hypothyroidism   . Barrett esophagus   . COPD (chronic obstructive pulmonary disease)   . Chronic respiratory failure   . Pulmonary fibrosis   . GERD (gastroesophageal reflux disease)   . Shortness of  breath     WITH EXERTION  . History of home oxygen therapy     AT NIGHT WHEN SLEEPING 2 L / MIN NASAL CANNULA  . History of ARDS 2006  . Pneumococcal pneumonia 2006    HOSPITALIZED AND DEVELOPED ARDS  . Arthritis     OA RIGHT KNEE WITH PAIN   Principal Problem:   OA (osteoarthritis) of knee Active Problems:   HYPOTHYROIDISM   HYPERTENSION   History of home oxygen therapy   GERD (gastroesophageal reflux disease)   Barrett's esophagus  Estimated body mass index is 40.21 kg/(m^2) as calculated from the following:   Height as of this encounter: 4' 11.5" (1.511 m).   Weight as of this encounter: 91.808 kg (202 lb 6.4 oz). Up with therapy Discharge home with home health Diet - Cardiac diet Follow up - in 2 weeks Activity - WBAT Disposition - Home Condition Upon Discharge - Good D/C Meds - See DC Summary DVT Prophylaxis - Xarelto  Alyus Mofield 06/08/2013, 8:03 AM

## 2013-06-08 NOTE — Evaluation (Signed)
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 She will benefit from OT education while on acute for safety and progress independence with ADL but if pt d/c today, feel she is ok to do so from OT standpoint. No OT followup needs or DME.    OT Assessment  Patient needs continued OT Services    Follow Up Recommendations  No OT follow up;Supervision/Assistance - 24 hour    Barriers to Discharge      Equipment Recommendations  None recommended by OT    Recommendations for Other Services    Frequency  Min 2X/week    Precautions / Restrictions Precautions Precautions: Fall;Knee Precaution Comments: Instructed pt on KI use and when to D/C Required Braces or Orthoses: Knee Immobilizer - Right Restrictions Weight Bearing Restrictions: No RLE Weight Bearing: Weight bearing as tolerated   Pertinent Vitals/Pain 4/10 reposition, ice    ADL  Eating/Feeding: Simulated;Independent Where Assessed - Eating/Feeding: Chair Grooming: Performed;Wash/dry hands;Set up Where Assessed - Grooming: Supported sitting Upper Body Bathing: Simulated;Chest;Right arm;Left arm;Abdomen;Supervision/safety;Set up (supervison for PLB, rest) Where Assessed - Upper Body Bathing: Unsupported sitting Lower Body Bathing: Simulated;Minimal assistance Where Assessed - Lower Body Bathing: Supported sit to stand Upper Body Dressing: Simulated;Set up Where Assessed - Upper Body Dressing: Unsupported sitting Lower Body Dressing: Simulated;Moderate assistance Where Assessed - Lower Body Dressing: Supported sit to stand Toilet Transfer: Performed;Min Psychologist, sport and exercise: Comfort height toilet;Raised toilet seat with arms (or 3-in-1 over  toilet) Toileting - Clothing Manipulation and Hygiene: Performed;Min guard Where Assessed - Engineer, mining and Hygiene: Sit to stand from 3-in-1 or toilet Equipment Used: Knee Immobilizer;Rolling walker ADL Comments: Pt states her husband and daughter can assist with ADL at home. Discussed tub transfer technique and how family is to assist. Pt familiar with tub transfer bench and used it with her other knee surgery. Family can help with LB ADL. She needed cues intermittantly during session for hand placement and safety with RW (not to pick it up off the floor). Tried toilet transfer with and without 3in1. She is safe to transfer on and off handicapped toilet like she has at home with vanity beside for support. Pt does desat easily so discussed purse lip breathing and rest breaks PRN at home.    OT Diagnosis: Generalized weakness  OT Problem List: Decreased strength;Decreased activity tolerance;Decreased knowledge of use of DME or AE OT Treatment Interventions: Self-care/ADL training;DME and/or AE instruction;Therapeutic activities;Energy conservation   OT Goals(Current goals can be found in the care plan section) Acute Rehab OT Goals Patient Stated Goal: home. 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       Prior Functioning     Home Living Family/patient expects to be discharged to:: Private residence Living Arrangements: Spouse/significant other Available Help at Discharge: Family Type of Home: House Home Access: Stairs to enter Secretary/administrator of Steps: 3 Entrance Stairs-Rails: Right;Left Home Layout: One level Home Equipment: Environmental consultant - 2 wheels;Cane - single point;Tub bench Prior Function Level of Independence: Independent Communication Communication: No  difficulties  Vision/Perception     Cognition  Cognition Arousal/Alertness: Awake/alert Behavior During Therapy: WFL for tasks assessed/performed Overall Cognitive Status: Within Functional Limits for tasks assessed    Extremity/Trunk Assessment Upper Extremity Assessment Upper Extremity Assessment: Overall WFL for tasks assessed     Mobility Bed Mobility Bed Mobility: Supine to Sit Supine to Sit: 5: Supervision;HOB elevated Transfers Transfers: Sit to Stand;Stand to Sit Sit to Stand: 4: Min guard;With upper extremity assist;From bed;From toilet;From chair/3-in-1 Stand to Sit: 4: Min guard;With upper extremity assist;To chair/3-in-1;To toilet Details for Transfer Assistance: verbal cues for hand placement and safety     Exercise     Balance Balance Balance Assessed: Yes Dynamic Standing Balance Dynamic Standing - Level of Assistance: 5: Stand by assistance   End of Session OT - End of Session Activity Tolerance: Patient tolerated treatment well Patient left: in chair;with call bell/phone within reach CPM Right Knee CPM Right Knee: Off  GO     Lennox Laity [phone] 06/08/2013, 10:26 AM

## 2013-06-09 DIAGNOSIS — M069 Rheumatoid arthritis, unspecified: Secondary | ICD-10-CM | POA: Diagnosis not present

## 2013-06-09 DIAGNOSIS — F329 Major depressive disorder, single episode, unspecified: Secondary | ICD-10-CM | POA: Diagnosis not present

## 2013-06-09 DIAGNOSIS — Z471 Aftercare following joint replacement surgery: Secondary | ICD-10-CM | POA: Diagnosis not present

## 2013-06-09 DIAGNOSIS — Z96659 Presence of unspecified artificial knee joint: Secondary | ICD-10-CM | POA: Diagnosis not present

## 2013-06-09 DIAGNOSIS — IMO0001 Reserved for inherently not codable concepts without codable children: Secondary | ICD-10-CM | POA: Diagnosis not present

## 2013-06-10 DIAGNOSIS — F329 Major depressive disorder, single episode, unspecified: Secondary | ICD-10-CM | POA: Diagnosis not present

## 2013-06-10 DIAGNOSIS — Z96659 Presence of unspecified artificial knee joint: Secondary | ICD-10-CM | POA: Diagnosis not present

## 2013-06-10 DIAGNOSIS — M069 Rheumatoid arthritis, unspecified: Secondary | ICD-10-CM | POA: Diagnosis not present

## 2013-06-10 DIAGNOSIS — Z471 Aftercare following joint replacement surgery: Secondary | ICD-10-CM | POA: Diagnosis not present

## 2013-06-10 DIAGNOSIS — IMO0001 Reserved for inherently not codable concepts without codable children: Secondary | ICD-10-CM | POA: Diagnosis not present

## 2013-06-13 DIAGNOSIS — IMO0001 Reserved for inherently not codable concepts without codable children: Secondary | ICD-10-CM | POA: Diagnosis not present

## 2013-06-13 DIAGNOSIS — Z96659 Presence of unspecified artificial knee joint: Secondary | ICD-10-CM | POA: Diagnosis not present

## 2013-06-13 DIAGNOSIS — M069 Rheumatoid arthritis, unspecified: Secondary | ICD-10-CM | POA: Diagnosis not present

## 2013-06-13 DIAGNOSIS — F329 Major depressive disorder, single episode, unspecified: Secondary | ICD-10-CM | POA: Diagnosis not present

## 2013-06-13 DIAGNOSIS — Z471 Aftercare following joint replacement surgery: Secondary | ICD-10-CM | POA: Diagnosis not present

## 2013-06-14 DIAGNOSIS — IMO0001 Reserved for inherently not codable concepts without codable children: Secondary | ICD-10-CM | POA: Diagnosis not present

## 2013-06-14 DIAGNOSIS — F329 Major depressive disorder, single episode, unspecified: Secondary | ICD-10-CM | POA: Diagnosis not present

## 2013-06-14 DIAGNOSIS — Z96659 Presence of unspecified artificial knee joint: Secondary | ICD-10-CM | POA: Diagnosis not present

## 2013-06-14 DIAGNOSIS — Z471 Aftercare following joint replacement surgery: Secondary | ICD-10-CM | POA: Diagnosis not present

## 2013-06-14 DIAGNOSIS — M069 Rheumatoid arthritis, unspecified: Secondary | ICD-10-CM | POA: Diagnosis not present

## 2013-06-15 DIAGNOSIS — Z96659 Presence of unspecified artificial knee joint: Secondary | ICD-10-CM | POA: Diagnosis not present

## 2013-06-15 DIAGNOSIS — M069 Rheumatoid arthritis, unspecified: Secondary | ICD-10-CM | POA: Diagnosis not present

## 2013-06-15 DIAGNOSIS — IMO0001 Reserved for inherently not codable concepts without codable children: Secondary | ICD-10-CM | POA: Diagnosis not present

## 2013-06-15 DIAGNOSIS — F329 Major depressive disorder, single episode, unspecified: Secondary | ICD-10-CM | POA: Diagnosis not present

## 2013-06-15 DIAGNOSIS — Z471 Aftercare following joint replacement surgery: Secondary | ICD-10-CM | POA: Diagnosis not present

## 2013-06-16 DIAGNOSIS — Z96659 Presence of unspecified artificial knee joint: Secondary | ICD-10-CM | POA: Diagnosis not present

## 2013-06-16 DIAGNOSIS — F329 Major depressive disorder, single episode, unspecified: Secondary | ICD-10-CM | POA: Diagnosis not present

## 2013-06-16 DIAGNOSIS — M069 Rheumatoid arthritis, unspecified: Secondary | ICD-10-CM | POA: Diagnosis not present

## 2013-06-16 DIAGNOSIS — IMO0001 Reserved for inherently not codable concepts without codable children: Secondary | ICD-10-CM | POA: Diagnosis not present

## 2013-06-16 DIAGNOSIS — Z471 Aftercare following joint replacement surgery: Secondary | ICD-10-CM | POA: Diagnosis not present

## 2013-06-17 ENCOUNTER — Telehealth: Payer: Self-pay | Admitting: Internal Medicine

## 2013-06-17 DIAGNOSIS — M069 Rheumatoid arthritis, unspecified: Secondary | ICD-10-CM | POA: Diagnosis not present

## 2013-06-17 DIAGNOSIS — Z96659 Presence of unspecified artificial knee joint: Secondary | ICD-10-CM | POA: Diagnosis not present

## 2013-06-17 DIAGNOSIS — F329 Major depressive disorder, single episode, unspecified: Secondary | ICD-10-CM | POA: Diagnosis not present

## 2013-06-17 DIAGNOSIS — Z471 Aftercare following joint replacement surgery: Secondary | ICD-10-CM | POA: Diagnosis not present

## 2013-06-17 DIAGNOSIS — IMO0001 Reserved for inherently not codable concepts without codable children: Secondary | ICD-10-CM | POA: Diagnosis not present

## 2013-06-17 DIAGNOSIS — R0902 Hypoxemia: Secondary | ICD-10-CM

## 2013-06-17 NOTE — Telephone Encounter (Signed)
Spoke with patient-- Pt recently had knee surgery done Jun 06 2013 and is due to return to orthopedic sx Aug 19 Patient states she is using her o2 only at night now via concentrator She says she has been encouraged from PT to try and resume her portable o2 (had this d/c'd some time ago) Patient reports she desats as low as 83% on RA Dr. Sherene Sires please advise, thank you!  DME: [employer]  Last OV 05/03/13 Next OV 08/08/13

## 2013-06-17 NOTE — Telephone Encounter (Signed)
Sounds like she needs portable 02 but I don't know how to certify her for it at home - will send this note to Almyra Free to work out  If possible per Christoper Allegra but ideally need ov to regroup asap re all her resp needs

## 2013-06-20 DIAGNOSIS — Z471 Aftercare following joint replacement surgery: Secondary | ICD-10-CM | POA: Diagnosis not present

## 2013-06-20 DIAGNOSIS — Z96659 Presence of unspecified artificial knee joint: Secondary | ICD-10-CM | POA: Diagnosis not present

## 2013-06-20 DIAGNOSIS — F329 Major depressive disorder, single episode, unspecified: Secondary | ICD-10-CM | POA: Diagnosis not present

## 2013-06-20 DIAGNOSIS — IMO0001 Reserved for inherently not codable concepts without codable children: Secondary | ICD-10-CM | POA: Diagnosis not present

## 2013-06-20 DIAGNOSIS — M069 Rheumatoid arthritis, unspecified: Secondary | ICD-10-CM | POA: Diagnosis not present

## 2013-06-20 NOTE — Telephone Encounter (Signed)
Order faxed to [name] ° °

## 2013-06-20 NOTE — Telephone Encounter (Signed)
i need an order put in for pt to be tested in her home on ra 02 sats to qualify for her 24/7/02 Tobe Sos

## 2013-06-20 NOTE — Telephone Encounter (Signed)
Spoke with patient, Patient stated it is almost impossible to come in and be seen by Dr. Sherene Sires d/t post knee surgery Per patient - "just forget about it for now" Says she will call back and make an appt in the next 1-2 weeks depending on how she feels. Will forward to Dr. Sherene Sires so that he is aware

## 2013-06-21 DIAGNOSIS — F329 Major depressive disorder, single episode, unspecified: Secondary | ICD-10-CM | POA: Diagnosis not present

## 2013-06-21 DIAGNOSIS — Z471 Aftercare following joint replacement surgery: Secondary | ICD-10-CM | POA: Diagnosis not present

## 2013-06-21 DIAGNOSIS — IMO0001 Reserved for inherently not codable concepts without codable children: Secondary | ICD-10-CM | POA: Diagnosis not present

## 2013-06-21 DIAGNOSIS — Z96659 Presence of unspecified artificial knee joint: Secondary | ICD-10-CM | POA: Diagnosis not present

## 2013-06-21 DIAGNOSIS — M069 Rheumatoid arthritis, unspecified: Secondary | ICD-10-CM | POA: Diagnosis not present

## 2013-06-22 DIAGNOSIS — F329 Major depressive disorder, single episode, unspecified: Secondary | ICD-10-CM | POA: Diagnosis not present

## 2013-06-22 DIAGNOSIS — Z471 Aftercare following joint replacement surgery: Secondary | ICD-10-CM | POA: Diagnosis not present

## 2013-06-22 DIAGNOSIS — IMO0001 Reserved for inherently not codable concepts without codable children: Secondary | ICD-10-CM | POA: Diagnosis not present

## 2013-06-22 DIAGNOSIS — M069 Rheumatoid arthritis, unspecified: Secondary | ICD-10-CM | POA: Diagnosis not present

## 2013-06-22 DIAGNOSIS — Z96659 Presence of unspecified artificial knee joint: Secondary | ICD-10-CM | POA: Diagnosis not present

## 2013-06-23 DIAGNOSIS — Z471 Aftercare following joint replacement surgery: Secondary | ICD-10-CM | POA: Diagnosis not present

## 2013-06-23 DIAGNOSIS — Z96659 Presence of unspecified artificial knee joint: Secondary | ICD-10-CM | POA: Diagnosis not present

## 2013-06-23 DIAGNOSIS — IMO0001 Reserved for inherently not codable concepts without codable children: Secondary | ICD-10-CM | POA: Diagnosis not present

## 2013-06-23 DIAGNOSIS — F329 Major depressive disorder, single episode, unspecified: Secondary | ICD-10-CM | POA: Diagnosis not present

## 2013-06-23 DIAGNOSIS — M069 Rheumatoid arthritis, unspecified: Secondary | ICD-10-CM | POA: Diagnosis not present

## 2013-06-24 DIAGNOSIS — F329 Major depressive disorder, single episode, unspecified: Secondary | ICD-10-CM | POA: Diagnosis not present

## 2013-06-24 DIAGNOSIS — Z471 Aftercare following joint replacement surgery: Secondary | ICD-10-CM | POA: Diagnosis not present

## 2013-06-24 DIAGNOSIS — IMO0001 Reserved for inherently not codable concepts without codable children: Secondary | ICD-10-CM | POA: Diagnosis not present

## 2013-06-24 DIAGNOSIS — Z96659 Presence of unspecified artificial knee joint: Secondary | ICD-10-CM | POA: Diagnosis not present

## 2013-06-24 DIAGNOSIS — M069 Rheumatoid arthritis, unspecified: Secondary | ICD-10-CM | POA: Diagnosis not present

## 2013-06-27 ENCOUNTER — Ambulatory Visit (INDEPENDENT_AMBULATORY_CARE_PROVIDER_SITE_OTHER): Payer: Medicare Other | Admitting: Internal Medicine

## 2013-06-27 ENCOUNTER — Encounter: Payer: Self-pay | Admitting: Internal Medicine

## 2013-06-27 VITALS — BP 120/60 | HR 92 | Temp 100.3°F | Ht 59.0 in | Wt 199.4 lb

## 2013-06-27 DIAGNOSIS — J8410 Pulmonary fibrosis, unspecified: Secondary | ICD-10-CM | POA: Diagnosis not present

## 2013-06-27 DIAGNOSIS — J9610 Chronic respiratory failure, unspecified whether with hypoxia or hypercapnia: Secondary | ICD-10-CM | POA: Diagnosis not present

## 2013-06-27 DIAGNOSIS — J45909 Unspecified asthma, uncomplicated: Secondary | ICD-10-CM

## 2013-06-27 NOTE — Patient Instructions (Addendum)
Goal is to keep your oxygen level above 90% while exercising and keep in mind your need up with the intensity of exercise   Work on inhaler technique:  relax and gently blow all the way out then take a nice smooth deep breath back in, triggering the inhaler at same time you start breathing in.  Hold for up to 5 seconds if you can.  Rinse and gargle with water when done.  Please see patient coordinator before you leave today  to schedule portable 02 @ 2lpm with all activities   Keep appointment for follow up pfts

## 2013-06-27 NOTE — Progress Notes (Signed)
Subjective:    Patient ID: [name], female    DOB: [birth year]    MRN: [id number]  Brief patient profile:  34 yowf quit smoking in May 2006 pna/ ards resumed full activity including yardwork at wt 140 and pft's c/w restrictive changes 04/2010 at wt 170     July 18, 2010  1st pulmonary office eval in ER era c/o doe x 6 months progressive indolent onset with copd vs pf.  Ultimately required hosp 8/30-9/1 dx copd/pf ? cause on sulfasalzine and ACE inhib better on 02 and advair but very hoarse with sense of chest congestion and cough with white mucus day > night.  doe x > slow adls.stop advair Start symbicort 160 2 puffs first thing  in am and 2 puffs again in pm about 12 hours later  Work on inhaler technique:   stop benazapril start benicar 40/25  one daily Use 02 sleeping and any activity other than sitting still  August 20, 2010 ov Dyspnea- much better, not limiting, no cough.Try stopping the night time dose of symbicort first and then the am dose if doing well  Remain on 02 at bedtime for now at 2lpm and just use it during the day with exertion to help lose weight your lungs are actually in pretty good shape and pulmonary follow up can be as needed > did not follow recs for using 02 with ex to help loose wt  December 05, 2010 ov p exac of chf 1/31 seen in er  with HBP and bnp 222  and now 02 dep 24 hours per day,  no cough, only walking maybe 100 ft.  baseline = sob RA same distance she now struggles to do on 02 but definitely better since back on 02 and hbp rx. Rec no change rx  01/21/2011 ov cc cough 3 x a day minimal mucoid sputum, not taking symbicort even  one twice daily and no noct cough.  DOE better with intentional wt loss.  rec work on hfa technique and only use 02 hs and daytime as needed  02/25/2011 ov/[name] cc sob better with intentional wt loss to point where often not using 02 with activity and only using symbicort one bid  - no cough or noct c/os  08/10/12 Follow up   Returns for follow up  Wants to discuss getting rid of O2.  Had ONO last week, results indicate +desats that require O2 support.  We discussed this in detail .  Walking today in office with notable desats with O2 , She was instructed to wear with  Walking as well.  She is in aggreement and admits she get short of breath with walking. And has to stop and rest.  No increased cough. No edema or fever.  >cont o2    05/03/2013 consultation per Dr [name]   re pre op eval asthma/pf p ards on symbicort 160 2bid /02 at hs only Chief Complaint  Patient presents with  . Follow-up    Breathing doing well. Here for pulmonary clearance for TKR-left. Surgery already set for 06/06/13 per Dr [name].     had TKR 5 years prior to OV  At [facility]. Not needing any rescue at all, limited much more by knees than breathing. rec Ok for surgery  > RTKR Jun 06 2013    06/27/2013 f/u ov/[name] post hosp f/u  Chief Complaint  Patient presents with  . Follow-up    Pt states here to see if she qualifies  for ambulatory o2. She is concerned about this since she is going to start PT after having TKR.   no real change x being pushed harder with sat of 86% now with exertion but ok at rest.  No obvious daytime variabilty or assoc chronic cough or cp or chest tightness, subjective wheeze overt sinus or hb symptoms. No unusual exp hx or h/o childhood pna/ asthma or knowledge of premature birth.   Sleeping ok on 02 2lpm without nocturnal  or early am exacerbation  of respiratory  c/o's or need for noct saba. Also denies any obvious fluctuation of symptoms with weather or environmental changes or other aggravating or alleviating factors except as outlined above     Current Medications, Allergies,   Past Surgical History, Family History, and Social History were reviewed in [employer] record.  ROS  The following are not active complaints unless bolded sore throat, dysphagia, dental problems, itching,  sneezing,  nasal congestion or excess/ purulent secretions, ear ache,   fever, chills, sweats, unintended wt loss, pleuritic or exertional cp, hemoptysis,  orthopnea pnd or leg swelling, presyncope, palpitations, heartburn, abdominal pain, anorexia, nausea, vomiting, diarrhea  or change in bowel or urinary habits, change in stools or urine, dysuria,hematuria,  rash, arthralgias, visual complaints, headache, numbness weakness or ataxia or problems with walking or coordination,  change in mood/affect or memory.        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       - CT chest 07/03/10 Nonspecific PF mostly upper lobes       - CT chest 12/03/10 acute gg changes and effusions c/w chf        - PFT's  04/12/10 FEV1  1.21 (69%) ratio 77 and no change p B2,  DLC0 56% Asthmatic bronchitis     - HFA 50% p coaching July 18, 2010 >>  90%  02/25/2011  history of mild vitamin D deficiency        Objective:   Physical Exam    amb slt hoarse mod obese wf nad all smiles    wt 192 July 18, 2010 > 197 August 21, 2010 > 207 December 05, 2010  > 183  01/21/2011  > 179 02/25/2011 >189 08/10/2012 >195 09/08/12 >200 12/27/12 > 202 05/03/2013 > 06/28/2013  199 HEENT mild turbinate edema.  Oropharynx no thrush or excess pnd or cobblestoning.  No JVD or cervical adenopathy. Mild accessory muscle hypertrophy. Trachea midline, nl thryroid.  Trace crackles on insp  R > L base.. . Regular rate and rhythm without murmur gallop or rub or increase P2 or edema.  Abd: no hsm, nl excursion. Ext warm without cyanosis or clubbing. Skin with classic psoriatic changes both  hands. R knee wound looks good  cxr 12/27/12 Improved chronic interstitial accentuation.

## 2013-06-28 ENCOUNTER — Telehealth: Payer: Self-pay | Admitting: Internal Medicine

## 2013-06-28 DIAGNOSIS — M25569 Pain in unspecified knee: Secondary | ICD-10-CM | POA: Diagnosis not present

## 2013-06-28 NOTE — Telephone Encounter (Signed)
Order was fax at 4 PM yesterday. I called [facility] and spoke with Sheria Lang. I was advised they did receive order this AM and it is being run through her insurance. They will contact pt once they are done. Nothing further needed

## 2013-06-28 NOTE — Assessment & Plan Note (Signed)
-   02 dependent  since 07/02/10 >>  83% RA December 05, 2010       - ONO RA 08/05/12  :  Positive sat < 89 x 2:75m> repeat on 2lpm rec 08/12/2012  - 06/17/2013 reported desat with activity p Knee surgery > rec restart 2lpm with activity  - 06/27/2013   Walked 2lpm  x one lap @ 185 stopped due to sat 88% not sob , desat to 82% on RA just at the  Start of the walk    Therefore needs 02 at 2lpm with all activity more than rest

## 2013-06-28 NOTE — Assessment & Plan Note (Signed)
-   Clinical dx based on response to symbicort  The proper method of use, as well as anticipated side effects, of a metered-dose inhaler are discussed and demonstrated to the patient. Improved effectiveness after extensive coaching during this visit to a level of approximately  75% so continue symbicort for now

## 2013-06-28 NOTE — Assessment & Plan Note (Signed)
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

## 2013-06-30 DIAGNOSIS — R0902 Hypoxemia: Secondary | ICD-10-CM | POA: Diagnosis not present

## 2013-06-30 DIAGNOSIS — R069 Unspecified abnormalities of breathing: Secondary | ICD-10-CM | POA: Diagnosis not present

## 2013-06-30 DIAGNOSIS — M25569 Pain in unspecified knee: Secondary | ICD-10-CM | POA: Diagnosis not present

## 2013-06-30 DIAGNOSIS — J449 Chronic obstructive pulmonary disease, unspecified: Secondary | ICD-10-CM | POA: Diagnosis not present

## 2013-07-05 ENCOUNTER — Encounter: Payer: Self-pay | Admitting: Internal Medicine

## 2013-07-06 DIAGNOSIS — M25569 Pain in unspecified knee: Secondary | ICD-10-CM | POA: Diagnosis not present

## 2013-07-08 DIAGNOSIS — M25569 Pain in unspecified knee: Secondary | ICD-10-CM | POA: Diagnosis not present

## 2013-07-11 DIAGNOSIS — M25519 Pain in unspecified shoulder: Secondary | ICD-10-CM | POA: Diagnosis not present

## 2013-07-12 DIAGNOSIS — M1710 Unilateral primary osteoarthritis, unspecified knee: Secondary | ICD-10-CM | POA: Diagnosis not present

## 2013-07-12 DIAGNOSIS — IMO0002 Reserved for concepts with insufficient information to code with codable children: Secondary | ICD-10-CM | POA: Diagnosis not present

## 2013-07-14 DIAGNOSIS — M1710 Unilateral primary osteoarthritis, unspecified knee: Secondary | ICD-10-CM | POA: Diagnosis not present

## 2013-07-14 DIAGNOSIS — IMO0002 Reserved for concepts with insufficient information to code with codable children: Secondary | ICD-10-CM | POA: Diagnosis not present

## 2013-07-28 ENCOUNTER — Encounter: Payer: Self-pay | Admitting: Internal Medicine

## 2013-08-08 ENCOUNTER — Ambulatory Visit (INDEPENDENT_AMBULATORY_CARE_PROVIDER_SITE_OTHER): Payer: Medicare Other | Admitting: Internal Medicine

## 2013-08-08 ENCOUNTER — Encounter: Payer: Self-pay | Admitting: Internal Medicine

## 2013-08-08 VITALS — BP 124/72 | HR 87 | Temp 97.4°F | Ht 60.0 in | Wt 194.0 lb

## 2013-08-08 DIAGNOSIS — J9610 Chronic respiratory failure, unspecified whether with hypoxia or hypercapnia: Secondary | ICD-10-CM | POA: Diagnosis not present

## 2013-08-08 DIAGNOSIS — J45909 Unspecified asthma, uncomplicated: Secondary | ICD-10-CM

## 2013-08-08 DIAGNOSIS — J8410 Pulmonary fibrosis, unspecified: Secondary | ICD-10-CM

## 2013-08-08 LAB — PULMONARY FUNCTION TEST

## 2013-08-08 MED ORDER — BUDESONIDE-FORMOTEROL FUMARATE 160-4.5 MCG/ACT IN AERO: 2.0000 | INHALATION_SPRAY | Freq: Two times a day (BID) | RESPIRATORY_TRACT | Status: DC

## 2013-08-08 NOTE — Progress Notes (Signed)
PFT done today. 

## 2013-08-08 NOTE — Patient Instructions (Addendum)
Please schedule a follow up visit in 12  months but call sooner if needed  

## 2013-08-08 NOTE — Progress Notes (Signed)
Subjective:    Patient ID: [name], female    DOB: 17-Dec-1945    MRN: [id number]  Brief patient profile:  49 yowf quit smoking in May 2006 pna/ ards resumed full activity including yardwork at wt 140 and pft's c/w restrictive changes 04/2010 at wt 170    History of Present Illness  July 18, 2010  1st pulmonary office eval in ER era c/o doe x 6 months progressive indolent onset with copd vs pf.  Ultimately required hosp 8/30-9/1 dx copd/pf ? cause on sulfasalzine and ACE inhib better on 02 and advair but very hoarse with sense of chest congestion and cough with white mucus day > night.  doe x > slow adls.stop advair Start symbicort 160 2 puffs first thing  in am and 2 puffs again in pm about 12 hours later  Work on inhaler technique:   stop benazapril start benicar 40/25  one daily Use 02 sleeping and any activity other than sitting still  August 20, 2010 ov Dyspnea- much better, not limiting, no cough.Try stopping the night time dose of symbicort first and then the am dose if doing well  Remain on 02 at bedtime for now at 2lpm and just use it during the day with exertion to help lose weight your lungs are actually in pretty good shape and pulmonary follow up can be as needed > did not follow recs for using 02 with ex to help loose wt  December 05, 2010 ov p exac of chf 1/31 seen in er  with HBP and bnp 222  and now 02 dep 24 hours per day,  no cough, only walking maybe 100 ft.  baseline = sob RA same distance she now struggles to do on 02 but definitely better since back on 02 and hbp rx. Rec no change rx  01/21/2011 ov cc cough 3 x a day minimal mucoid sputum, not taking symbicort even  one twice daily and no noct cough.  DOE better with intentional wt loss.  rec work on hfa technique and only use 02 hs and daytime as needed  02/25/2011 ov/Rosine Solecki cc sob better with intentional wt loss to point where often not using 02 with activity and only using symbicort one bid  - no cough or noct  c/os  08/10/12 Follow up  Returns for follow up  Wants to discuss getting rid of O2.  Had ONO last week, results indicate +desats that require O2 support.  We discussed this in detail .  Walking today in office with notable desats with O2 , She was instructed to wear with  Walking as well.  She is in aggreement and admits she get short of breath with walking. And has to stop and rest.  No increased cough. No edema or fever.  >cont o2    05/03/2013 consultation per Dr Despina Hick   re pre op eval asthma/pf p ards on symbicort 160 2bid /02 at hs only Chief Complaint  Patient presents with  . Follow-up    Breathing doing well. Here for pulmonary clearance for TKR-left. Surgery already set for 06/06/13 per Dr Despina Hick.     had TKR 5 years prior to OV  At [facility]. Not needing any rescue at all, limited much more by knees than breathing. rec Ok for surgery  > RTKR Jun 06 2013    06/27/2013 f/u ov/[name] post hosp f/u  Chief Complaint  Patient presents with  . Follow-up    Pt states here to  see if she qualifies for ambulatory o2. She is concerned about this since she is going to start PT after having TKR.   no real change x being pushed harder with sat of 86% now with exertion but ok at rest. rec Goal is to keep your oxygen level above 90% while exercising and keep in mind your need up with the intensity of exercise  Work on inhaler technique:     Please see patient coordinator before you leave today  to schedule portable 02 @ 2lpm with all activities   08/08/2013 f/u ov/Achillies Buehl re: ild/ asthma Chief Complaint  Patient presents with  . Follow-up    Pt states that her breathing is doing well. She denies any new co's today.     Doe only [place],  Ok flat extended at slower pace than others but not really limited from desired activies on symbicort 160 2 bid and no saba needed   No obvious daytime variabilty or assoc chronic cough or cp or chest tightness, subjective wheeze overt sinus or hb symptoms. No  unusual exp hx or h/o childhood pna/ asthma or knowledge of premature birth.   Sleeping ok on 02 2lpm without nocturnal  or early am exacerbation  of respiratory  c/o's or need for noct saba. Also denies any obvious fluctuation of symptoms with weather or environmental changes or other aggravating or alleviating factors except as outlined above     Current Medications, Allergies,   Past Surgical History, Family History, and Social History were reviewed in [employer] record.  ROS  The following are not active complaints unless bolded sore throat, dysphagia, dental problems, itching, sneezing,  nasal congestion or excess/ purulent secretions, ear ache,   fever, chills, sweats, unintended wt loss, pleuritic or exertional cp, hemoptysis,  orthopnea pnd or leg swelling, presyncope, palpitations, heartburn, abdominal pain, anorexia, nausea, vomiting, diarrhea  or change in bowel or urinary habits, change in stools or urine, dysuria,hematuria,  rash, arthralgias, visual complaints, headache, numbness weakness or ataxia or problems with walking or coordination,  change in mood/affect or memory.        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       - CT chest 07/03/10 Nonspecific PF mostly upper lobes       - CT chest 12/03/10 acute gg changes and effusions c/w chf        - PFT's  04/12/10 FEV1  1.21 (69%) ratio 77 and no change p B2,  DLC0 56% Asthmatic bronchitis     - HFA 50% p coaching July 18, 2010 >>  90%  02/25/2011  history of mild vitamin D deficiency        Objective:   Physical Exam    amb slt  hoarse mod obese wf nad all smiles/ quite animated and pleasant     wt 192 July 18, 2010 > 197 August 21, 2010 > 207 December 05, 2010  > 183  01/21/2011  > 179 02/25/2011 >189 08/10/2012 >195 09/08/12 >200 12/27/12 > 202 05/03/2013 > 06/28/2013  199 > 08/08/13 194  HEENT mild turbinate edema.  Oropharynx no  thrush or excess pnd or cobblestoning.  No JVD or cervical adenopathy. Mild accessory muscle hypertrophy. Trachea midline, nl thryroid.  Trace crackles on insp  R > L base.. . Regular rate and rhythm without murmur gallop or rub or increase P2 or edema.  Abd: no hsm, nl excursion. Ext warm without cyanosis or clubbing. Skin with classic psoriatic changes both hands. R knee wound looks good  cxr 12/27/12 Improved chronic interstitial accentuation.

## 2013-08-09 NOTE — Assessment & Plan Note (Addendum)
-   02 dependent  since 07/02/10 >>  83% RA December 05, 2010       - ONO RA 08/05/12  :  Positive sat < 89 x 2:5m> repeat on 2lpm rec 08/12/2012  - 06/17/2013 reported desat with activity p Knee surgery > rec restart 2lpm with activity  - 06/27/2013   Walked 2lpm  x one lap @ 185 stopped due to sat 88% not sob , desat to 82% on RA just at the  Start of the walk   06/2813 02 titration recs  2lpm 24/7 but 3lpm with activity  08/08/13 02 sat 96% RA  at rest so really only needs 2lpm sleep/ 3lpm activity  Emphasized wearing 02 with ex will help her burn fat

## 2013-08-09 NOTE — Assessment & Plan Note (Signed)
-  s/p ARDS 2006 with bacteremic S  Pna       - CT chest 07/03/10 Nonspecific PF mostly upper lobes       - CT chest 12/03/10 acute gg changes and effusions c/w chf        - PFT's  04/12/10 FEV1  1.21 (69%) ratio 77 and no change p B2,  DLC0 56%   VC 70%         - PFTs  08/08/2013 FEV1 1.21 (60%) ratio 86 and no change p B2 DLCO 79%  VC 72%  On symbicort 160 2bid   Clearly this is not progressive ild  But rather post ards pf > rx 02 prn

## 2013-08-09 NOTE — Assessment & Plan Note (Signed)
-   Clinical dx based on response to symbicort     - HFA 75% 06/27/2013   All goals of chronic asthma control met including optimal function and elimination of symptoms with minimal need for rescue therapy.  Contingencies discussed in full including contacting this office immediately if not controlling the symptoms using the rule of two's.

## 2013-08-15 DIAGNOSIS — E559 Vitamin D deficiency, unspecified: Secondary | ICD-10-CM | POA: Diagnosis not present

## 2013-08-15 DIAGNOSIS — M159 Polyosteoarthritis, unspecified: Secondary | ICD-10-CM | POA: Diagnosis not present

## 2013-08-15 DIAGNOSIS — IMO0001 Reserved for inherently not codable concepts without codable children: Secondary | ICD-10-CM | POA: Diagnosis not present

## 2013-08-15 DIAGNOSIS — M069 Rheumatoid arthritis, unspecified: Secondary | ICD-10-CM | POA: Diagnosis not present

## 2013-08-18 ENCOUNTER — Telehealth: Payer: Self-pay | Admitting: Internal Medicine

## 2013-08-18 NOTE — Telephone Encounter (Signed)
Pt has scheduled welcome to medicare visit for 11/11/13, but she will need refills prior to then. She states that she will need a 3 month refill of her sulfaSALAzine (AZULFIDINE) 500 MG tablet, levothyroxine (SYNTHROID, LEVOTHROID) 200 MCG tablet, traMADol (ULTRAM) 50 MG tablet, amLODipine (NORVASC) 10 MG tablet, citalopram (CELEXA) 40 MG tablet, losartan-hydrochlorothiazide (HYZAAR) 100-25 MG per tablet, omeprazole (PRILOSEC) 20 MG capsule, and etanercept (ENBREL) 50 MG/ML injection *prefill syringe*. She would like these sent to the [facility] (fax) [phone]. Membership ID #- [id number]. Please include all of the patients demographics on this RX. Please assist.

## 2013-08-19 MED ORDER — AMLODIPINE BESYLATE 10 MG PO TABS: 10.0000 mg | ORAL_TABLET | Freq: Every morning | ORAL | Status: DC

## 2013-08-19 MED ORDER — OMEPRAZOLE 20 MG PO CPDR: 20.0000 mg | DELAYED_RELEASE_CAPSULE | Freq: Every morning | ORAL | Status: DC

## 2013-08-19 MED ORDER — LEVOTHYROXINE SODIUM 200 MCG PO TABS: 200.0000 ug | ORAL_TABLET | Freq: Every day | ORAL | Status: DC

## 2013-08-19 MED ORDER — ETANERCEPT 50 MG/ML ~~LOC~~ SOLN: 50.0000 mg | SUBCUTANEOUS | Status: DC

## 2013-08-19 MED ORDER — SULFASALAZINE 500 MG PO TABS: 1000.0000 mg | ORAL_TABLET | Freq: Every morning | ORAL | Status: DC

## 2013-08-19 MED ORDER — LOSARTAN POTASSIUM-HCTZ 100-25 MG PO TABS: 1.0000 | ORAL_TABLET | Freq: Every morning | ORAL | Status: DC

## 2013-08-19 MED ORDER — CITALOPRAM HYDROBROMIDE 40 MG PO TABS: 40.0000 mg | ORAL_TABLET | Freq: Every morning | ORAL | Status: DC

## 2013-08-19 MED ORDER — TRAMADOL HCL 50 MG PO TABS: 50.0000 mg | ORAL_TABLET | Freq: Three times a day (TID) | ORAL | Status: DC

## 2013-08-19 NOTE — Telephone Encounter (Signed)
Spoke to pt told her all Rx's were sent to [facility] and [facility] as requested. Pt verbalized understanding.

## 2013-08-19 NOTE — Telephone Encounter (Signed)
Pt called back and left voicemail, mail order is [facility]. Rx's sent to pharmacy.  Called pt and left message with husband that I will call back. He said pt will be back in 30 minutes. Told him okay.

## 2013-08-19 NOTE — Telephone Encounter (Signed)
Spoke to pt told her I need to know the pharmacy for the mail order Medicare is not the name of the pharmacy. Pt verbalized understanding and will get back to me. Pt stated she needs 30 day supply sent to local pharmacy. Told pt okay will sent to pharmacy.

## 2013-08-23 ENCOUNTER — Other Ambulatory Visit: Payer: Self-pay | Admitting: Internal Medicine

## 2013-08-23 DIAGNOSIS — J45909 Unspecified asthma, uncomplicated: Secondary | ICD-10-CM

## 2013-09-02 ENCOUNTER — Encounter: Payer: Self-pay | Admitting: Internal Medicine

## 2013-10-10 ENCOUNTER — Telehealth: Payer: Self-pay | Admitting: Internal Medicine

## 2013-10-10 MED ORDER — LOSARTAN POTASSIUM-HCTZ 100-25 MG PO TABS: 1.0000 | ORAL_TABLET | Freq: Every morning | ORAL | Status: DC

## 2013-10-10 MED ORDER — TRAMADOL HCL 50 MG PO TABS: 50.0000 mg | ORAL_TABLET | Freq: Three times a day (TID) | ORAL | Status: DC

## 2013-10-10 NOTE — Telephone Encounter (Signed)
Pt would like nurse to call her back concerning incorrect rxs

## 2013-10-10 NOTE — Telephone Encounter (Signed)
Spoke to pt said her Rx for Tramadol and Hyzaar were sent in wrong. Told pt will resend Rx's to [facility].  Pt verbalized understanding.

## 2013-10-15 DIAGNOSIS — Z23 Encounter for immunization: Secondary | ICD-10-CM | POA: Diagnosis not present

## 2013-10-17 DIAGNOSIS — J449 Chronic obstructive pulmonary disease, unspecified: Secondary | ICD-10-CM | POA: Diagnosis not present

## 2013-10-17 DIAGNOSIS — R069 Unspecified abnormalities of breathing: Secondary | ICD-10-CM | POA: Diagnosis not present

## 2013-10-17 DIAGNOSIS — R0902 Hypoxemia: Secondary | ICD-10-CM | POA: Diagnosis not present

## 2013-11-08 ENCOUNTER — Telehealth: Payer: Self-pay | Admitting: Internal Medicine

## 2013-11-08 NOTE — Telephone Encounter (Signed)
lmomtcb x1 

## 2013-11-08 NOTE — Telephone Encounter (Signed)
Should be fine but needs to work with her 23 company to arrange appropriate follow up there

## 2013-11-08 NOTE — Telephone Encounter (Signed)
Called, spoke with pt.  Informed her of below per MW.  She verbalized understanding, will contact o2 company, and voiced no further questions or concerns at this time.

## 2013-11-08 NOTE — Telephone Encounter (Signed)
Spoke with pt and she stated that her husband has been dx with a rare heart failure and will be going to the [facility] in [place].   Pt stated that she is on oxygen at bedtime and prn with activity.   She stated that she did know if being in [place] with the different air temps would cause a problem for her with her breathing or with her oxygen.  MW please advise. Thanks  No Known Allergies   Current Outpatient Prescriptions on File Prior to Visit  Medication Sig Dispense Refill  . acetaminophen (TYLENOL ARTHRITIS PAIN) 650 MG CR tablet Take 1,300 mg by mouth every 8 (eight) hours. Per bottle directions as needed      . amLODipine (NORVASC) 10 MG tablet Take 1 tablet (10 mg total) by mouth every morning.  90 tablet  1  . budesonide-formoterol (SYMBICORT) 160-4.5 MCG/ACT inhaler Inhale 2 puffs into the lungs 2 (two) times daily.  3 Inhaler  3  . citalopram (CELEXA) 40 MG tablet Take 1 tablet (40 mg total) by mouth every morning.  90 tablet  1  . etanercept (ENBREL) 50 MG/ML injection Inject 0.98 mLs (50 mg total) into the skin once a week.  11.76 mL  1  . levothyroxine (SYNTHROID, LEVOTHROID) 200 MCG tablet Take 1 tablet (200 mcg total) by mouth daily before breakfast.  90 tablet  1  . losartan-hydrochlorothiazide (HYZAAR) 100-25 MG per tablet Take 1 tablet by mouth every morning.  90 tablet  1  . omeprazole (PRILOSEC) 20 MG capsule Take 1 capsule (20 mg total) by mouth every morning.  90 capsule  1  . sulfaSALAzine (AZULFIDINE) 500 MG tablet Take 2 tablets (1,000 mg total) by mouth every morning.  180 tablet  1  . traMADol (ULTRAM) 50 MG tablet Take 1-2 tablets (50-100 mg total) by mouth 3 (three) times daily with meals. Take one tablet in the am, and 2 tablets at lunch, and 1 tablet with supper  360 tablet  1  . triamcinolone cream (KENALOG) 0.1 % Apply 1 application topically 2 (two) times daily as needed (for psorisis).       No current facility-administered medications on file prior to  visit.

## 2013-11-11 ENCOUNTER — Encounter: Payer: Medicare Other | Admitting: Internal Medicine

## 2013-11-22 ENCOUNTER — Telehealth: Payer: Self-pay | Admitting: Internal Medicine

## 2013-11-22 NOTE — Telephone Encounter (Signed)
Form given to [facility] per Gildardo Pounds please advise thanks

## 2013-11-23 ENCOUNTER — Encounter: Payer: Self-pay | Admitting: Internal Medicine

## 2013-11-23 NOTE — Telephone Encounter (Signed)
Spoke to [email or web address]  THE FORMS PT LEFT HERE HAVE NOTING TO DO WITH THE 02 FOR HER FLIGHT CAROL WILL CALL PT AND GET THIS WORKED OUT FOR HER TRAVEL Tobe Sos

## 2013-11-29 ENCOUNTER — Telehealth: Payer: Self-pay | Admitting: Internal Medicine

## 2013-11-29 NOTE — Telephone Encounter (Signed)
Spoke with pt. She is referring to the paperwork she dropped off to have filled out for her upcoming flight. Per the MyChart message, these forms are up front to picked up. Pt has not checked her MyChart account so she was not aware of this. Nothing further was needed.

## 2013-12-05 ENCOUNTER — Telehealth: Payer: Self-pay | Admitting: Internal Medicine

## 2013-12-05 NOTE — Telephone Encounter (Signed)
Form in your lookat  Thanks!

## 2013-12-05 NOTE — Telephone Encounter (Signed)
Form done and faxed to [employer] Pt aware  Nothing further needed

## 2013-12-16 DIAGNOSIS — M069 Rheumatoid arthritis, unspecified: Secondary | ICD-10-CM | POA: Diagnosis not present

## 2013-12-16 DIAGNOSIS — E559 Vitamin D deficiency, unspecified: Secondary | ICD-10-CM | POA: Diagnosis not present

## 2013-12-16 DIAGNOSIS — M159 Polyosteoarthritis, unspecified: Secondary | ICD-10-CM | POA: Diagnosis not present

## 2013-12-16 DIAGNOSIS — IMO0001 Reserved for inherently not codable concepts without codable children: Secondary | ICD-10-CM | POA: Diagnosis not present

## 2013-12-26 DIAGNOSIS — M069 Rheumatoid arthritis, unspecified: Secondary | ICD-10-CM | POA: Diagnosis not present

## 2014-01-10 DIAGNOSIS — M069 Rheumatoid arthritis, unspecified: Secondary | ICD-10-CM | POA: Diagnosis not present

## 2014-02-07 DIAGNOSIS — M069 Rheumatoid arthritis, unspecified: Secondary | ICD-10-CM | POA: Diagnosis not present

## 2014-02-10 DIAGNOSIS — Z1231 Encounter for screening mammogram for malignant neoplasm of breast: Secondary | ICD-10-CM | POA: Diagnosis not present

## 2014-03-10 DIAGNOSIS — H40019 Open angle with borderline findings, low risk, unspecified eye: Secondary | ICD-10-CM | POA: Diagnosis not present

## 2014-03-10 DIAGNOSIS — H1045 Other chronic allergic conjunctivitis: Secondary | ICD-10-CM | POA: Diagnosis not present

## 2014-03-22 DIAGNOSIS — M069 Rheumatoid arthritis, unspecified: Secondary | ICD-10-CM | POA: Diagnosis not present

## 2014-04-03 ENCOUNTER — Other Ambulatory Visit: Payer: Self-pay | Admitting: Internal Medicine

## 2014-04-04 ENCOUNTER — Telehealth: Payer: Self-pay | Admitting: Internal Medicine

## 2014-04-04 MED ORDER — TRAMADOL HCL 50 MG PO TABS: 50.0000 mg | ORAL_TABLET | Freq: Three times a day (TID) | ORAL | Status: DC

## 2014-04-04 NOTE — Telephone Encounter (Signed)
Spoke to pt told her received request for refills from [facility] but she has not been seen since 08/2012. Needs to make an appointment. Pt verbalized understanding and will call back and schedule physical. Asked pt if she has enough Tramadol right now? Pt said yes.   Rx for Tramadol not faxed to [facility].

## 2014-04-04 NOTE — Telephone Encounter (Signed)
[facility] - [place], [place] - [address] is requesting re-fill on traMADol (ULTRAM) 50 MG tablet ° °

## 2014-04-04 NOTE — Telephone Encounter (Signed)
Rx faxed to [facility]

## 2014-04-10 ENCOUNTER — Encounter: Payer: Self-pay | Admitting: Internal Medicine

## 2014-04-10 ENCOUNTER — Ambulatory Visit (INDEPENDENT_AMBULATORY_CARE_PROVIDER_SITE_OTHER): Payer: Medicare Other | Admitting: Internal Medicine

## 2014-04-10 VITALS — BP 114/70 | HR 88 | Temp 98.4°F | Ht 59.5 in | Wt 203.0 lb

## 2014-04-10 DIAGNOSIS — J9610 Chronic respiratory failure, unspecified whether with hypoxia or hypercapnia: Secondary | ICD-10-CM

## 2014-04-10 DIAGNOSIS — J45909 Unspecified asthma, uncomplicated: Secondary | ICD-10-CM | POA: Diagnosis not present

## 2014-04-10 DIAGNOSIS — J8410 Pulmonary fibrosis, unspecified: Secondary | ICD-10-CM

## 2014-04-10 NOTE — Patient Instructions (Addendum)
Work on inhaler technique:  relax and gently blow all the way out then take a nice smooth deep breath back in, triggering the inhaler at same time you start breathing in.  Hold for up to 5 seconds if you can.  Rinse and gargle with water when done  [facility] to just use the symbicort 160 2 puffs in am to see what difference if any this makes in your breathing   Change 02 to 2lpm 24/7 and use 3lpm with activity   Please schedule a follow up visit in 3 months but call sooner if needed

## 2014-04-10 NOTE — Progress Notes (Signed)
Subjective:   Patient ID: [name], female    DOB: 07-Feb-1946    MRN: [id number]     Brief patient profile:  3 yowf quit smoking in May 2006 pna/ ards resumed full activity including yardwork at wt 140 and pft's c/w restrictive changes 04/2010 at wt 170 but reported improvement on saba so maintained chronically on symbicort 160   History of Present Illness  July 18, 2010  1st pulmonary office eval in ER era c/o doe x 6 months progressive indolent onset with copd vs pf.  Ultimately required hosp 8/30-9/1 dx copd/pf ? cause on sulfasalzine and ACE inhib better on 02 and advair but very hoarse with sense of chest congestion and cough with white mucus day > night.  doe x > slow adls.stop advair Start symbicort 160 2 puffs first thing  in am and 2 puffs again in pm about 12 hours later  Work on inhaler technique:   stop benazapril start benicar 40/25  one daily Use 02 sleeping and any activity other than sitting still   01/21/2011 ov cc cough 3 x a day minimal mucoid sputum, not taking symbicort even  one twice daily and no noct cough.  DOE better with intentional wt loss.   rec work on hfa technique and only use 02 hs and daytime as needed  05/03/2013 consultation per Dr Despina Hick   re pre op eval asthma/pf p ards on symbicort 160 2bid /02 at hs only Chief Complaint  Patient presents with  . Follow-up    Breathing doing well. Here for pulmonary clearance for TKR-left. Surgery already set for 06/06/13 per Dr Despina Hick.     had TKR 5 years prior to OV  At [facility]. Not needing any rescue at all, limited much more by knees than breathing. rec Ok for surgery  > RTKR Jun 06 2013    06/27/2013 f/u ov/Melissia Lahman post hosp f/u  Chief Complaint  Patient presents with  . Follow-up    Pt states here to see if she qualifies for ambulatory o2. She is concerned about this since she is going to start PT after having TKR.   no real change x being pushed harder with sat of 86% now with exertion but ok at  rest. rec Goal is to keep your oxygen level above 90% while exercising and keep in mind your need up with the intensity of exercise  Work on inhaler technique:     Please see patient coordinator before you leave today  to schedule portable 02 @ 2lpm with all activities      04/10/2014 f/u ov/Nirvaan Frett re: restrictive lung dz/ RA/psoriatric/ symbicort 160 2bid  Chief Complaint  Patient presents with  . Follow-up    Pt states was advised to f/u per [employer].  Pt has been having increased SOB- relates to stress from dealing with her spouse's illness. She is using o2 pretty much 24/7.   trying to do more than baseline activity and finds she needs 02 more.  No obvious daytime variabilty or assoc chronic cough or cp or chest tightness, subjective wheeze overt sinus or hb symptoms. No unusual exp hx or h/o childhood pna/ asthma or knowledge of premature birth.   Sleeping ok on 02 2lpm without nocturnal  or early am exacerbation  of respiratory  c/o's or need for noct saba. Also denies any obvious fluctuation of symptoms with weather or environmental changes or other aggravating or alleviating factors except as outlined above   Current Medications, Allergies,  Past Surgical History, Family History, and Social History were reviewed in [employer] record.  ROS  The following are not active complaints unless bolded sore throat, dysphagia, dental problems, itching, sneezing,  nasal congestion or excess/ purulent secretions, ear ache,   fever, chills, sweats, unintended wt loss, pleuritic or exertional cp, hemoptysis,  orthopnea pnd or leg swelling, presyncope, palpitations, heartburn, abdominal pain, anorexia, nausea, vomiting, diarrhea  or change in bowel or urinary habits, change in stools or urine, dysuria,hematuria,  rash, arthralgias, visual complaints, headache, numbness weakness or ataxia or problems with walking or coordination,  change in mood/affect or memory.        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       - CT chest 07/03/10 Nonspecific PF mostly upper lobes       - CT chest 12/03/10 acute gg changes and effusions c/w chf        - PFT's  04/12/10 FEV1  1.21 (69%) ratio 77 and no change p B2,  DLC0 56% Asthmatic bronchitis     - HFA 50% p coaching July 18, 2010 >>  90%  02/25/2011  history of mild vitamin D deficiency        Objective:   Physical Exam     amb   mod obese wf  nad     wt 192 July 18, 2010 > 197 August 21, 2010 > 207 December 05, 2010  > 183  01/21/2011  > 179 02/25/2011 >189 08/10/2012 >195 09/08/12 >200 12/27/12 > 202 05/03/2013 > 06/28/2013  199 > 08/08/13 194 > 04/10/2014  203   HEENT mild turbinate edema.  Oropharynx no thrush or excess pnd or cobblestoning.  No JVD or cervical adenopathy. Mild accessory muscle hypertrophy. Trachea midline, nl thryroid.  Trace crackles on insp  R > L base.. . Regular rate and rhythm without murmur gallop or rub or increase P2 or edema.  Abd: no hsm, nl excursion. Ext warm without cyanosis or clubbing. Skin with classic psoriatic changes both hands

## 2014-04-11 ENCOUNTER — Telehealth: Payer: Self-pay | Admitting: *Deleted

## 2014-04-11 DIAGNOSIS — J9610 Chronic respiratory failure, unspecified whether with hypoxia or hypercapnia: Secondary | ICD-10-CM

## 2014-04-11 NOTE — Telephone Encounter (Signed)
Spoke with pt and notified that needs to return for cxr  She verbalized understanding and will do so  Order in [employer]

## 2014-04-11 NOTE — Assessment & Plan Note (Addendum)
-  s/p ARDS 2006 with bacteremic S  Pna       - CT chest 07/03/10 Nonspecific PF mostly upper lobes       - CT chest 12/03/10 acute gg changes and effusions c/w chf        - PFT's  04/12/10 FEV1  1.21 (69%) ratio 77 and no change p B2,  DLC0 56%   VC 70%         - PFTs  08/08/2013 FEV1 1.21 (60%) ratio 86 and no change p B2 DLCO 79%  VC 72%  On symbicort 160 2bid   Concerned re worse ILD > see chronic resp failure a/p

## 2014-04-11 NOTE — Telephone Encounter (Signed)
Message copied by Christen Butter on Tue Apr 11, 2014 10:57 AM ------      Message from: Nyoka Cowden      Created: Tue Apr 11, 2014  7:27 AM       Somehow I forgot to order the cxr > needs this due to new finding of resting hypoxemia so see if she can return at her convenience ------

## 2014-04-11 NOTE — Assessment & Plan Note (Addendum)
-   Clinical dx based on response to symbicort  The proper method of use, as well as anticipated side effects, of a metered-dose inhaler are discussed and demonstrated to the patient. Improved effectiveness after extensive coaching during this visit to a level of approximately  75%   No apparent bronchospastic component so ok to use the symbicort 160 2 qam to see if does just as well, esp in am, otherwise resume 2bid dosing

## 2014-04-11 NOTE — Assessment & Plan Note (Addendum)
-   02 dependent  since 07/02/10 >>  83% RA December 05, 2010       - ONO RA 08/05/12  :  Positive sat < 89 x 2:78m> repeat on 2lpm rec 08/12/2012  - 06/17/2013 reported desat with activity p Knee surgery > rec restart 2lpm with activity  - 06/27/2013   Walked 2lpm  x one lap @ 185 stopped due to sat 88% not sob , desat to 82% on RA just at the  Start of the walk  - 04/10/14  Sats  81% RA and then  Walked 3lpm x  2 laps @ 185 ft each stopped due to end of study, no desat   rec 2lpm at rest/ 3lpm with activity   Not clear why now needs 02 at rest > rec repeat cxr then ? HRCT looking for worse ILD ? Related to RA or meds for RA?

## 2014-04-12 ENCOUNTER — Encounter: Payer: Self-pay | Admitting: Internal Medicine

## 2014-04-12 ENCOUNTER — Telehealth: Payer: Self-pay | Admitting: Internal Medicine

## 2014-04-12 ENCOUNTER — Ambulatory Visit (INDEPENDENT_AMBULATORY_CARE_PROVIDER_SITE_OTHER): Payer: Medicare Other | Admitting: Internal Medicine

## 2014-04-12 VITALS — BP 130/68 | HR 95 | Temp 98.6°F | Resp 20 | Ht 59.0 in | Wt 202.0 lb

## 2014-04-12 DIAGNOSIS — E785 Hyperlipidemia, unspecified: Secondary | ICD-10-CM | POA: Diagnosis not present

## 2014-04-12 DIAGNOSIS — E039 Hypothyroidism, unspecified: Secondary | ICD-10-CM

## 2014-04-12 DIAGNOSIS — L4050 Arthropathic psoriasis, unspecified: Secondary | ICD-10-CM | POA: Diagnosis not present

## 2014-04-12 DIAGNOSIS — Z0000 Encounter for general adult medical examination without abnormal findings: Secondary | ICD-10-CM | POA: Diagnosis not present

## 2014-04-12 DIAGNOSIS — I10 Essential (primary) hypertension: Secondary | ICD-10-CM | POA: Diagnosis not present

## 2014-04-12 DIAGNOSIS — Z23 Encounter for immunization: Secondary | ICD-10-CM

## 2014-04-12 DIAGNOSIS — I5033 Acute on chronic diastolic (congestive) heart failure: Secondary | ICD-10-CM

## 2014-04-12 DIAGNOSIS — J9610 Chronic respiratory failure, unspecified whether with hypoxia or hypercapnia: Secondary | ICD-10-CM

## 2014-04-12 LAB — LIPID PANEL
Cholesterol: 205 mg/dL — ABNORMAL HIGH (ref 0–200)
HDL: 48 mg/dL (ref >39.00)
LDL Cholesterol: 138 mg/dL — ABNORMAL HIGH (ref 0–99)
NonHDL: 157
Total CHOL/HDL Ratio: 4
Triglycerides: 97 mg/dL (ref 0.0–149.0)
VLDL: 19.4 mg/dL (ref 0.0–40.0)

## 2014-04-12 LAB — TSH: TSH: 2.26 u[IU]/mL (ref 0.35–4.50)

## 2014-04-12 MED ORDER — TRAMADOL HCL 50 MG PO TABS: 50.0000 mg | ORAL_TABLET | Freq: Three times a day (TID) | ORAL | Status: DC

## 2014-04-12 NOTE — Telephone Encounter (Signed)
Relevant patient education assigned to patient using Emmi. ° °

## 2014-04-12 NOTE — Progress Notes (Signed)
Subjective:    Patient ID: [name], female    DOB: [birth year], 68 y.o.   MRN: [id number]  HPI  Subjective:    Patient ID: [name], female    DOB: 1946-07-19, 68 y.o.   MRN: [id number]  HPI   Wt Readings from Last 3 Encounters:  08/12/11 178 lb (80.74 kg)  04/08/11 171 lb (77.565 kg)  02/25/11 179 lb (81.194 kg)   History of Present Illness:  68  year-old patient who is seen today for a health maintenance exam.  She has been followed closely by pulmonary. 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 She has done quite well and is now on nocturnal O2 as well as with vigorous physical activities. She feels quite well and works part-time. She has treated hypertension and hypothyroidism;   She is followed closely by rheumatology.  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 Normal. Lung findings  Chest CT April 2011- bilateral pulmonary infiltrates; coronary artery calcification  Colonoscopy 2006  [facility]) Bilateral  Cataract  extraction surgery  Family History:    father died at age 77 of coronary artery disease.  mother died at 18 of breast cancer.  One sister, status post a gastric bypass  RA- Father   Social History:   Married  Children  Former smoker. Quit in May 2006. Smoked for approx 39 yrs up to 1 1/2 ppd  No ETOH  Works in the [employer] at [employer]    1. Risk factors, based on past  M,S,F history.  Cardiovascular factors include hypertension  2.  Physical activities: Limited due to arthritis, obesity, and chronic lung disease  3.  Depression/mood: Situational depression due to the poor health of her husband  4.  Hearing: No deficits  5.  ADL's: Independent.  Primary caregiver for her spouse  6.  Fall risk: Moderate due to weight  7.  Home safety: No problems identified  8.  Height weight, and visual acuity; no change in visual acuity continues to gain weight  9.  Counseling: Weight loss and exercise regimen.  Encouraged  10. Lab orders based on risk factors: Will check lipid profile and TSH.  She is followed for, rheumatology  11. Referral : Followup pulmonary and rheumatology  12. Care plan: Weight loss and heart healthy diet and coverage;  graded exercise program.  Encouraged  13. Cognitive assessment: Alert and oriented with normal affect.  No cognitive dysfunction     Past Medical History  Diagnosis Date  .  Psoriatic arthritis   . Rheumatic disease   . Hypertension   . Hypothyroidism   . Barrett esophagus   . COPD (chronic obstructive pulmonary disease)   . Chronic respiratory failure   . Pulmonary fibrosis   . GERD (gastroesophageal reflux disease)   . Shortness of breath     WITH EXERTION  . History of home oxygen therapy     AT NIGHT WHEN SLEEPING 2 L / MIN NASAL CANNULA  . History of ARDS 2006  . Pneumococcal pneumonia 2006    HOSPITALIZED AND DEVELOPED ARDS  . Arthritis     OA RIGHT KNEE WITH PAIN     History   Social History  . Marital Status: Married    Spouse Name: N/A    Number of Children: N/A  . Years of Education: N/A   Occupational History  . Floral Manager  [name]   Social History Main Topics  . Smoking status: Former Smoker -- 1.50 packs/day for 58.5 years    Types: Cigarettes    Quit date: 03/03/2005  . Smokeless tobacco: Never Used  . Alcohol Use: No  . Drug Use: No  . Sexual Activity: Not on file   Other Topics Concern  . Not on file   Social History Narrative  . No narrative on file    Past Surgical History  Procedure Laterality Date  . Carpal tunnel release    . Cosmetic breast surgery    . Abdominoplasty    . Cholecystectomy    . Left knee arthroscopic    . Left total knee replacement    . Joint replacement    . Total knee arthroplasty Right 06/06/2013    Procedure: RIGHT TOTAL KNEE ARTHROPLASTY;  Surgeon: Loanne Drilling, MD;  Location: [facility];  Service: Orthopedics;  Laterality: Right;    Family History  Problem Relation Age of Onset  . Coronary artery disease Father   . Breast cancer Mother   . Rheum arthritis Father     No Known Allergies  Current Outpatient Prescriptions on File Prior to Visit  Medication Sig Dispense Refill  . acetaminophen (TYLENOL ARTHRITIS PAIN) 650 MG CR tablet Take 1,300 mg by mouth every 8 (eight) hours. Per bottle directions as needed      . amLODipine (NORVASC) 10 MG tablet Take 1 tablet (10 mg total) by mouth every morning.  90 tablet  1  . budesonide-formoterol (SYMBICORT) 160-4.5 MCG/ACT inhaler Inhale 2 puffs into the lungs 2 (two) times daily.  3 Inhaler  3  . citalopram (CELEXA) 40 MG tablet Take 1 tablet (40 mg total) by mouth every morning.  90 tablet  1  . levothyroxine (SYNTHROID, LEVOTHROID) 200 MCG tablet Take 1 tablet (200 mcg total) by mouth daily before breakfast.  90 tablet  1  . losartan-hydrochlorothiazide (HYZAAR) 100-25 MG per tablet Take 1 tablet by mouth every morning.  90 tablet   1  . omeprazole (PRILOSEC) 20 MG capsule Take 1 capsule (20 mg total) by mouth every morning.  90 capsule  1  . sulfaSALAzine (AZULFIDINE) 500 MG tablet Take 2 tablets (1,000 mg total) by mouth every morning.  180 tablet  1  . traMADol (ULTRAM) 50 MG tablet Take 1-2 tablets (50-100 mg total) by mouth 3 (three) times daily with meals. Take one tablet in the am, and 2 tablets at lunch, and 1 tablet with supper  360 tablet  1  . triamcinolone cream (KENALOG) 0.1 % Apply 1 application topically 2 (two)  times daily as needed (for psorisis).      Marland Kitchen UNABLE TO FIND Med Name: Remicaid per Dr. Dierdre Forth       No current facility-administered medications on file prior to visit.    There were no vitals taken for this visit.    Review of Systems  Constitutional: Negative for fever, appetite change, fatigue and unexpected weight change.  HENT: Negative for hearing loss, ear pain, nosebleeds, congestion, sore throat, mouth sores, trouble swallowing, neck stiffness, dental problem, voice change, sinus pressure and tinnitus.   Eyes: Negative for photophobia, pain, redness and visual disturbance.  Respiratory: Negative for cough, chest tightness and shortness of breath.   Cardiovascular: Negative for chest pain, palpitations and leg swelling.  Gastrointestinal: Negative for nausea, vomiting, abdominal pain, diarrhea, constipation, blood in stool, abdominal distention and rectal pain.  Genitourinary: Negative for dysuria, urgency, frequency, hematuria, flank pain, vaginal bleeding, vaginal discharge, difficulty urinating, genital sores, vaginal pain, menstrual problem and pelvic pain.  Musculoskeletal: Negative for back pain and arthralgias.  Skin: Negative for rash.  Neurological: Negative for dizziness, syncope, speech difficulty, weakness, light-headedness, numbness and headaches.  Hematological: Negative for adenopathy. Does not bruise/bleed easily.  Psychiatric/Behavioral: Negative for suicidal ideas,  behavioral problems, self-injury, dysphoric mood and agitation. The patient is not nervous/anxious.        Objective:   Physical Exam  Constitutional: She is oriented to person, place, and time. She appears well-developed and well-nourished.  HENT:  Head: Normocephalic and atraumatic.  Right Ear: External ear normal.  Left Ear: External ear normal.  Mouth/Throat: Oropharynx is clear and moist.  Eyes: Conjunctivae and EOM are normal.  Neck: Normal range of motion. Neck supple. No JVD present. No thyromegaly present.  Cardiovascular: Normal rate, regular rhythm, normal heart sounds and intact distal pulses.   No murmur heard. Pulmonary/Chest: Effort normal and breath sounds normal. She has no wheezes. She has no rales.  Abdominal: Soft. Bowel sounds are normal. She exhibits no distension and no mass. There is no tenderness. There is no rebound and no guarding.  Musculoskeletal: Normal range of motion. She exhibits no edema and no tenderness.  Neurological: She is alert and oriented to person, place, and time. She has normal reflexes. No cranial nerve deficit. She exhibits normal muscle tone. Coordination normal.  Skin: Skin is warm and dry. No rash noted.  Psychiatric: She has a normal mood and affect. Her behavior is normal.          Assessment & Plan:    Preventive health examination Hypothyroidism. Her TSH is slightly elevated she has been very compliant with her Synthroid supplementation we'll increase her dose to 0.2 mg daily Hypertension well controlled Rheumatoid arthritis COPD and low-grade interstitial fibrosis. Followup pulmonary  Exercise weight loss encouraged. Return here in one year or as needed  Wt Readings from Last 3 Encounters:  04/10/14 203 lb (92.08 kg)  08/08/13 194 lb (87.998 kg)  06/27/13 199 lb 6.4 oz (90.447 kg)    Review of Systems  As above    Objective:   Physical Exam   As above      Assessment & Plan:   As above

## 2014-04-12 NOTE — Patient Instructions (Signed)
Limit your sodium (Salt) intake  You need to lose weight.  Consider a lower calorie diet and regular exercise.    It is important that you exercise regularly, at least 20 minutes 3 to 4 times per week.  If you develop chest pain or shortness of breath seek  medical attention.   

## 2014-04-12 NOTE — Progress Notes (Signed)
Pre-visit discussion using our clinic review tool. No additional management support is needed unless otherwise documented below in the visit note.  

## 2014-04-17 ENCOUNTER — Telehealth: Payer: Self-pay | Admitting: Internal Medicine

## 2014-04-17 ENCOUNTER — Ambulatory Visit (INDEPENDENT_AMBULATORY_CARE_PROVIDER_SITE_OTHER)
Admission: RE | Admit: 2014-04-17 | Discharge: 2014-04-17 | Disposition: A | Discharge status: Home / Self Care | Payer: Medicare Other | Source: Ambulatory Visit | Attending: Internal Medicine | Admitting: Internal Medicine

## 2014-04-17 DIAGNOSIS — J9600 Acute respiratory failure, unspecified whether with hypoxia or hypercapnia: Secondary | ICD-10-CM | POA: Diagnosis not present

## 2014-04-17 DIAGNOSIS — J9610 Chronic respiratory failure, unspecified whether with hypoxia or hypercapnia: Secondary | ICD-10-CM

## 2014-04-17 MED ORDER — LOSARTAN POTASSIUM-HCTZ 100-25 MG PO TABS: 1.0000 | ORAL_TABLET | Freq: Every morning | ORAL | Status: DC

## 2014-04-17 MED ORDER — CITALOPRAM HYDROBROMIDE 40 MG PO TABS: 40.0000 mg | ORAL_TABLET | Freq: Every morning | ORAL | Status: DC

## 2014-04-17 MED ORDER — AMLODIPINE BESYLATE 10 MG PO TABS: 10.0000 mg | ORAL_TABLET | Freq: Every morning | ORAL | Status: DC

## 2014-04-17 MED ORDER — LEVOTHYROXINE SODIUM 200 MCG PO TABS: 200.0000 ug | ORAL_TABLET | Freq: Every day | ORAL | Status: DC

## 2014-04-17 NOTE — Telephone Encounter (Signed)
Rx sent to pharmacy   

## 2014-04-17 NOTE — Telephone Encounter (Signed)
Pt needs [facility] rxs [facility] amlodipine,citalopram,levothyroxine and losartan-hctz #90 each with refills

## 2014-05-16 DIAGNOSIS — M159 Polyosteoarthritis, unspecified: Secondary | ICD-10-CM | POA: Diagnosis not present

## 2014-05-16 DIAGNOSIS — IMO0001 Reserved for inherently not codable concepts without codable children: Secondary | ICD-10-CM | POA: Diagnosis not present

## 2014-05-16 DIAGNOSIS — E559 Vitamin D deficiency, unspecified: Secondary | ICD-10-CM | POA: Diagnosis not present

## 2014-05-16 DIAGNOSIS — M069 Rheumatoid arthritis, unspecified: Secondary | ICD-10-CM | POA: Diagnosis not present

## 2014-07-11 DIAGNOSIS — M069 Rheumatoid arthritis, unspecified: Secondary | ICD-10-CM | POA: Diagnosis not present

## 2014-07-11 DIAGNOSIS — M159 Polyosteoarthritis, unspecified: Secondary | ICD-10-CM | POA: Diagnosis not present

## 2014-07-11 DIAGNOSIS — E559 Vitamin D deficiency, unspecified: Secondary | ICD-10-CM | POA: Diagnosis not present

## 2014-07-11 DIAGNOSIS — IMO0001 Reserved for inherently not codable concepts without codable children: Secondary | ICD-10-CM | POA: Diagnosis not present

## 2014-07-17 DIAGNOSIS — M069 Rheumatoid arthritis, unspecified: Secondary | ICD-10-CM | POA: Diagnosis not present

## 2014-07-18 ENCOUNTER — Telehealth: Payer: Self-pay | Admitting: Internal Medicine

## 2014-07-18 NOTE — Telephone Encounter (Signed)
Rx filled on 04/2014 enough for 6 months.

## 2014-07-18 NOTE — Telephone Encounter (Signed)
[facility] - [place], [place] - [address] is requesting re-fill on traMADol (ULTRAM) 50 MG tablet

## 2014-07-25 DIAGNOSIS — M25519 Pain in unspecified shoulder: Secondary | ICD-10-CM | POA: Diagnosis not present

## 2014-07-25 DIAGNOSIS — M069 Rheumatoid arthritis, unspecified: Secondary | ICD-10-CM | POA: Diagnosis not present

## 2014-07-25 DIAGNOSIS — M159 Polyosteoarthritis, unspecified: Secondary | ICD-10-CM | POA: Diagnosis not present

## 2014-07-25 DIAGNOSIS — IMO0001 Reserved for inherently not codable concepts without codable children: Secondary | ICD-10-CM | POA: Diagnosis not present

## 2014-07-25 DIAGNOSIS — E559 Vitamin D deficiency, unspecified: Secondary | ICD-10-CM | POA: Diagnosis not present

## 2014-07-31 DIAGNOSIS — M069 Rheumatoid arthritis, unspecified: Secondary | ICD-10-CM | POA: Diagnosis not present

## 2014-08-02 ENCOUNTER — Telehealth: Payer: Self-pay | Admitting: Internal Medicine

## 2014-08-02 MED ORDER — TRAMADOL HCL 50 MG PO TABS: 50.0000 mg | ORAL_TABLET | Freq: Three times a day (TID) | ORAL | Status: DC

## 2014-08-02 NOTE — Telephone Encounter (Addendum)
Pt states [facility]  Does not have her rx for traMADol (ULTRAM) 50 MG tablet.

## 2014-08-02 NOTE — Telephone Encounter (Signed)
Called [facility] and spoke to pharmacist, did not have Rx. Rx faxed to [facility]. Pt aware.

## 2014-08-09 ENCOUNTER — Telehealth: Payer: Self-pay | Admitting: *Deleted

## 2014-08-09 NOTE — Telephone Encounter (Signed)
Spoke to pt and clarified Tramadol Rx. Faxed to [facility].

## 2014-08-14 DIAGNOSIS — M0579 Rheumatoid arthritis with rheumatoid factor of multiple sites without organ or systems involvement: Secondary | ICD-10-CM | POA: Diagnosis not present

## 2014-08-17 DIAGNOSIS — Z23 Encounter for immunization: Secondary | ICD-10-CM | POA: Diagnosis not present

## 2014-09-13 DIAGNOSIS — L4050 Arthropathic psoriasis, unspecified: Secondary | ICD-10-CM | POA: Diagnosis not present

## 2014-10-10 ENCOUNTER — Other Ambulatory Visit: Payer: Self-pay | Admitting: Internal Medicine

## 2014-10-10 DIAGNOSIS — L409 Psoriasis, unspecified: Secondary | ICD-10-CM | POA: Diagnosis not present

## 2014-10-10 DIAGNOSIS — M25511 Pain in right shoulder: Secondary | ICD-10-CM | POA: Diagnosis not present

## 2014-10-10 DIAGNOSIS — M150 Primary generalized (osteo)arthritis: Secondary | ICD-10-CM | POA: Diagnosis not present

## 2014-10-10 DIAGNOSIS — M0589 Other rheumatoid arthritis with rheumatoid factor of multiple sites: Secondary | ICD-10-CM | POA: Diagnosis not present

## 2014-10-11 DIAGNOSIS — L4050 Arthropathic psoriasis, unspecified: Secondary | ICD-10-CM | POA: Diagnosis not present

## 2014-10-18 ENCOUNTER — Other Ambulatory Visit: Payer: Self-pay | Admitting: Internal Medicine

## 2014-11-08 DIAGNOSIS — L4050 Arthropathic psoriasis, unspecified: Secondary | ICD-10-CM | POA: Diagnosis not present

## 2014-11-28 DIAGNOSIS — E669 Obesity, unspecified: Secondary | ICD-10-CM | POA: Diagnosis not present

## 2014-12-07 DIAGNOSIS — L4050 Arthropathic psoriasis, unspecified: Secondary | ICD-10-CM | POA: Diagnosis not present

## 2014-12-15 DIAGNOSIS — L400 Psoriasis vulgaris: Secondary | ICD-10-CM | POA: Diagnosis not present

## 2014-12-15 DIAGNOSIS — L304 Erythema intertrigo: Secondary | ICD-10-CM | POA: Diagnosis not present

## 2015-01-04 DIAGNOSIS — M0589 Other rheumatoid arthritis with rheumatoid factor of multiple sites: Secondary | ICD-10-CM | POA: Diagnosis not present

## 2015-01-17 ENCOUNTER — Telehealth: Payer: Self-pay | Admitting: Internal Medicine

## 2015-01-17 NOTE — Telephone Encounter (Signed)
Left message with husband to call office tomorrow. 

## 2015-01-17 NOTE — Telephone Encounter (Signed)
Pt needs refill on levothyroxine  200 mcg#90 sent to [facility]. Pt needs mylan manufacture levothyroxine

## 2015-01-18 MED ORDER — LEVOTHYROXINE SODIUM 200 MCG PO TABS: ORAL_TABLET | ORAL | Status: DC

## 2015-01-18 NOTE — Telephone Encounter (Signed)
Pt called back, told her I do not know what manufacturer they use she would have to contact [facility]. Pt said that does not matter I was just reading what was on the bottle, just need refill on Levothyroxine. Told her okay will send refill. Pt verbalized understanding.

## 2015-02-01 DIAGNOSIS — L4050 Arthropathic psoriasis, unspecified: Secondary | ICD-10-CM | POA: Diagnosis not present

## 2015-02-05 DIAGNOSIS — E669 Obesity, unspecified: Secondary | ICD-10-CM | POA: Diagnosis not present

## 2015-02-08 ENCOUNTER — Telehealth: Payer: Self-pay | Admitting: Internal Medicine

## 2015-02-08 MED ORDER — TRAMADOL HCL 50 MG PO TABS: ORAL_TABLET | ORAL | Status: DC

## 2015-02-08 NOTE — Telephone Encounter (Signed)
Pt needs refill on tramadol 50 mg #360 call into [facility]

## 2015-02-08 NOTE — Telephone Encounter (Signed)
Left detailed message Rx faxed to [facility].

## 2015-02-19 DIAGNOSIS — Z1231 Encounter for screening mammogram for malignant neoplasm of breast: Secondary | ICD-10-CM | POA: Diagnosis not present

## 2015-02-19 DIAGNOSIS — Z803 Family history of malignant neoplasm of breast: Secondary | ICD-10-CM | POA: Diagnosis not present

## 2015-02-19 LAB — HM MAMMOGRAPHY

## 2015-02-20 ENCOUNTER — Encounter: Payer: Self-pay | Admitting: Internal Medicine

## 2015-03-02 DIAGNOSIS — E669 Obesity, unspecified: Secondary | ICD-10-CM | POA: Diagnosis not present

## 2015-03-05 DIAGNOSIS — L4050 Arthropathic psoriasis, unspecified: Secondary | ICD-10-CM | POA: Diagnosis not present

## 2015-03-20 ENCOUNTER — Telehealth: Payer: Self-pay | Admitting: Internal Medicine

## 2015-03-20 NOTE — Telephone Encounter (Signed)
Pt would like donna to return her call concerning mailorder rxs

## 2015-03-21 NOTE — Telephone Encounter (Signed)
Left message on voicemail to call office.  

## 2015-03-22 NOTE — Telephone Encounter (Signed)
Left message on voicemail to call office.  

## 2015-03-26 MED ORDER — PANTOPRAZOLE SODIUM 40 MG PO TBEC: DELAYED_RELEASE_TABLET | ORAL | Status: DC

## 2015-03-26 MED ORDER — AMLODIPINE BESYLATE 10 MG PO TABS: ORAL_TABLET | ORAL | Status: DC

## 2015-03-26 MED ORDER — CITALOPRAM HYDROBROMIDE 40 MG PO TABS: ORAL_TABLET | ORAL | Status: DC

## 2015-03-26 MED ORDER — LOSARTAN POTASSIUM-HCTZ 100-25 MG PO TABS: ORAL_TABLET | ORAL | Status: DC

## 2015-03-26 NOTE — Telephone Encounter (Signed)
Spoke to pt, needed refills for 3 medications sent to [facility]. Went over list and told pt will send refills now. PT verbalized understanding.

## 2015-04-09 DIAGNOSIS — E669 Obesity, unspecified: Secondary | ICD-10-CM | POA: Diagnosis not present

## 2015-04-10 DIAGNOSIS — L4050 Arthropathic psoriasis, unspecified: Secondary | ICD-10-CM | POA: Diagnosis not present

## 2015-04-19 ENCOUNTER — Encounter: Payer: Self-pay | Admitting: Internal Medicine

## 2015-04-19 ENCOUNTER — Ambulatory Visit
Admission: RE | Admit: 2015-04-19 | Discharge: 2015-04-19 | Disposition: A | Discharge status: Home / Self Care | Payer: Medicare Other | Source: Ambulatory Visit | Attending: Internal Medicine | Admitting: Internal Medicine

## 2015-04-19 ENCOUNTER — Ambulatory Visit (INDEPENDENT_AMBULATORY_CARE_PROVIDER_SITE_OTHER): Payer: Medicare Other | Admitting: Internal Medicine

## 2015-04-19 VITALS — BP 128/64 | HR 93 | Ht 59.5 in | Wt 176.0 lb

## 2015-04-19 DIAGNOSIS — I10 Essential (primary) hypertension: Secondary | ICD-10-CM | POA: Diagnosis not present

## 2015-04-19 DIAGNOSIS — J9611 Chronic respiratory failure with hypoxia: Secondary | ICD-10-CM

## 2015-04-19 DIAGNOSIS — J8410 Pulmonary fibrosis, unspecified: Secondary | ICD-10-CM

## 2015-04-19 DIAGNOSIS — J4530 Mild persistent asthma, uncomplicated: Secondary | ICD-10-CM

## 2015-04-19 DIAGNOSIS — E669 Obesity, unspecified: Secondary | ICD-10-CM

## 2015-04-19 MED ORDER — BUDESONIDE-FORMOTEROL FUMARATE 160-4.5 MCG/ACT IN AERO: 2.0000 | INHALATION_SPRAY | Freq: Two times a day (BID) | RESPIRATORY_TRACT | Status: DC

## 2015-04-19 NOTE — Patient Instructions (Signed)
Please see patient coordinator before you leave today  to schedule ambulatory home 02 titration to see if you are eligible for POC  Please remember to go to the   x-ray department downstairs for your tests - we will call you with the results when they are available.  Please schedule a follow up visit in 12 months but call sooner if needed

## 2015-04-19 NOTE — Progress Notes (Signed)
Subjective:   Patient ID: TIFANNY DOLLENS, female    DOB: 09-04-1946    MRN: [id number]     Brief patient profile:  10 yowf quit smoking in May 2006 with dx pna/ ards resumed full activity including yardwork at wt 140 and pft's c/w restrictive changes 04/2010 at wt 170 but reported improvement on saba so maintained chronically on symbicort 160 despite no sign airflow obst on pfts   History of Present Illness  July 18, 2010  1st pulmonary office eval in ER era c/o doe x 6 months progressive indolent onset with copd vs pf.  Ultimately required hosp 8/30-9/1 dx copd/pf ? cause on sulfasalzine and ACE inhib better on 02 and advair but very hoarse with sense of chest congestion and cough with white mucus day > night.  doe x > slow adls.stop advair Start symbicort 160 2 puffs first thing  in am and 2 puffs again in pm about 12 hours later  Work on inhaler technique:   stop benazapril start benicar 40/25  one daily Use 02 sleeping and any activity other than sitting still   04/10/2014 f/u ov/Vilas Edgerly re: restrictive lung dz/ RA/psoriatric/ symbicort 160 2bid  Chief Complaint  Patient presents with  . Follow-up    Pt states was advised to f/u per [employer].  Pt has been having increased SOB- relates to stress from dealing with her spouse's illness. She is using o2 pretty much 24/7.   trying to do more than baseline activity and finds she needs 02 more. rec Work on inhaler technique:   Ok to just use the symbicort 160 2 puffs in am to see what difference if any this makes in your breathing  Change 02 to 2lpm 24/7 and use 3lpm with activity     04/19/2015 f/u ov/Gilles Trimpe re: pf p ards/ obesity/ 02 dep  Chief Complaint  Patient presents with  . Follow-up    Pt states that her breathing has improved with weight loss.  She denies any co's today.     Not limited by breathing from desired activities  On 02 3lpmwith exertion.  No obvious daytime variabilty or assoc chronic cough or cp or chest tightness,  subjective wheeze overt sinus or hb symptoms. No unusual exp hx or h/o childhood pna/ asthma or knowledge of premature birth.   Sleeping ok on 02 2lpm without nocturnal  or early am exacerbation  of respiratory  c/o's or need for noct saba. Also denies any obvious fluctuation of symptoms with weather or environmental changes or other aggravating or alleviating factors except as outlined above   Current Medications, Allergies,   Past Surgical History, Family History, and Social History were reviewed in [employer] record.  ROS  The following are not active complaints unless bolded sore throat, dysphagia, dental problems, itching, sneezing,  nasal congestion or excess/ purulent secretions, ear ache,   fever, chills, sweats, unintended wt loss, pleuritic or exertional cp, hemoptysis,  orthopnea pnd or leg swelling, presyncope, palpitations, heartburn, abdominal pain, anorexia, nausea, vomiting, diarrhea  or change in bowel or urinary habits, change in stools or urine, dysuria,hematuria,  rash, arthralgias, visual complaints, headache, numbness weakness or ataxia or problems with walking or coordination,  change in mood/affect or memory.        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       - CT chest 07/03/10 Nonspecific PF mostly upper lobes       - CT chest 12/03/10 acute gg changes and effusions c/w chf        - PFT's  04/12/10 FEV1  1.21 (69%) ratio 77 and no change p B2,  DLC0 56% Asthmatic bronchitis     - HFA 50% p coaching July 18, 2010 >>  90%  02/25/2011  history of mild vitamin D deficiency         Objective:   Physical Exam   amb   mod obese wf  nad     wt 192 July 18, 2010 > 197 August 21, 2010 > 207 December 05, 2010  > 183  01/21/2011  > 179 02/25/2011 >189 08/10/2012 >195 09/08/12 >200 12/27/12 > 202 05/03/2013 > 06/28/2013  199 > 08/08/13 194 > 04/10/2014  203 > 04/19/2015   176   HEENT mild turbinate edema.  Oropharynx no thrush or excess pnd or cobblestoning.  No JVD or cervical adenopathy. Mild accessory muscle hypertrophy. Trachea midline, nl thryroid.  Trace crackles on insp  R > L base.. . Regular rate and rhythm without murmur gallop or rub or increase P2 or edema.  Abd: no hsm, nl excursion. Ext warm without cyanosis or clubbing. Skin with classic psoriatic changes both hands       CXR PA and Lateral:   04/19/2015 :     I personally reviewed images and agree with radiology impression as follows:     Lungs are hypoinflated without focal consolidation or effusion. Subtle focal density anteriorly on the lateral film unchanged from 2011 with minimal scarring in this region of the right middle lobe on prior CT. Cardiomediastinal silhouette is within normal. There is mild calcified plaque over the aortic arch. There are degenerative changes of the spine

## 2015-04-20 ENCOUNTER — Ambulatory Visit (INDEPENDENT_AMBULATORY_CARE_PROVIDER_SITE_OTHER)
Admission: RE | Admit: 2015-04-20 | Discharge: 2015-04-20 | Disposition: A | Discharge status: Home / Self Care | Payer: Medicare Other | Source: Ambulatory Visit | Attending: Internal Medicine | Admitting: Internal Medicine

## 2015-04-20 DIAGNOSIS — J8489 Other specified interstitial pulmonary diseases: Secondary | ICD-10-CM | POA: Diagnosis not present

## 2015-04-20 DIAGNOSIS — J8410 Pulmonary fibrosis, unspecified: Secondary | ICD-10-CM

## 2015-04-20 DIAGNOSIS — J449 Chronic obstructive pulmonary disease, unspecified: Secondary | ICD-10-CM | POA: Diagnosis not present

## 2015-04-22 ENCOUNTER — Encounter: Payer: Self-pay | Admitting: Internal Medicine

## 2015-04-22 HISTORY — DX: Morbid (severe) obesity due to excess calories: E66.01

## 2015-04-22 NOTE — Assessment & Plan Note (Signed)
-  s/p ARDS 2006 with bacteremic S  Pna       - CT chest 07/03/10 Nonspecific PF mostly upper lobes       - CT chest 12/03/10 acute gg changes and effusions c/w chf        - PFT's  04/12/10 FEV1  1.21 (69%) ratio 77 and no change p B2,  DLC0 56%   VC 70%         - PFTs  08/08/2013 FEV1 1.21 (60%) ratio 86 and no change p B2 DLCO 79%  VC 72%  On symbicort 160 2bid   Adequate control on present rx, reviewed > no change in rx needed

## 2015-04-22 NOTE — Assessment & Plan Note (Signed)
-   Clinical dx based on response to symbicort     - 04/10/2014 p extensive coaching HFA effectiveness =    75%  Adequate control on present rx, reviewed > no change in rx needed

## 2015-04-22 NOTE — Assessment & Plan Note (Signed)
Body mass index is 34.97 kg/(m^2).  Lab Results  Component Value Date   TSH 2.26 04/12/2014    Making progress with wt loss using neg cal balance concepts> reinforced with target wts reviewed based on getting bmi down to 25

## 2015-04-22 NOTE — Assessment & Plan Note (Signed)
-   02 dependent  since 07/02/10 >>  83% RA December 05, 2010       - ONO RA 08/05/12  :  Positive sat < 89 x 2:24m> repeat on 2lpm rec 08/12/2012  - 06/17/2013 reported desat with activity p Knee surgery > rec restart 2lpm with activity  - 06/27/2013   Walked 2lpm  x one lap @ 185 stopped due to sat 88% not sob , desat to 82% on RA just at the  Start of the walk  - 04/10/14  Sats  81% RA and then  Walked 3lpm x  2 laps @ 185 ft each stopped due to end of study, no desat   6/ 16/16  rec 2lpm at rest/ 3lpm with activity

## 2015-04-22 NOTE — Assessment & Plan Note (Signed)
Adequate control on present rx, reviewed > no change in rx needed   

## 2015-04-23 NOTE — Progress Notes (Signed)
Quick Note:  Spoke with pt and notified of results per Dr. Wert. Pt verbalized understanding and denied any questions.  ______ 

## 2015-04-27 ENCOUNTER — Encounter: Payer: Self-pay | Admitting: Internal Medicine

## 2015-04-27 NOTE — Telephone Encounter (Signed)
Chip Boer from [facility] returned Ashley's call.  She states they need the ONO testing and a 6 min walk. She can be reached at [phone].

## 2015-04-27 NOTE — Telephone Encounter (Signed)
Pt sent a mychart message stating that she has not yet heard from [place] about her POC order from 04/19/15.  I called [facility], they state that they need an ONO ordered before they can evaluate pt for POC.  I see nothing in pt's chart to indicate that [facility] needed this info.    Dr. Sherene Sires are you ok with ordering the ono?  If so please specify if on room air or on 02.  Thanks!

## 2015-04-30 ENCOUNTER — Telehealth: Payer: Self-pay | Admitting: Internal Medicine

## 2015-04-30 NOTE — Telephone Encounter (Signed)
Called Okey Regal w/ [facility]. They received pt recent order placed. I advised her we do not have any 30 day recent testing. Per Okey Regal pt will need to have a test done in office but she is going to look through pt chart because she was confused on why they were not billing pt for her portable O2. She is going to call back.

## 2015-04-30 NOTE — Telephone Encounter (Signed)
Okey Regal reports she is working with billing on this patient and as of now they do not anything.

## 2015-05-03 DIAGNOSIS — M0589 Other rheumatoid arthritis with rheumatoid factor of multiple sites: Secondary | ICD-10-CM | POA: Diagnosis not present

## 2015-05-03 DIAGNOSIS — M25511 Pain in right shoulder: Secondary | ICD-10-CM | POA: Diagnosis not present

## 2015-05-03 DIAGNOSIS — L409 Psoriasis, unspecified: Secondary | ICD-10-CM | POA: Diagnosis not present

## 2015-05-03 DIAGNOSIS — M150 Primary generalized (osteo)arthritis: Secondary | ICD-10-CM | POA: Diagnosis not present

## 2015-05-09 DIAGNOSIS — Z961 Presence of intraocular lens: Secondary | ICD-10-CM | POA: Diagnosis not present

## 2015-05-09 DIAGNOSIS — H5201 Hypermetropia, right eye: Secondary | ICD-10-CM | POA: Diagnosis not present

## 2015-05-09 DIAGNOSIS — H5200 Hypermetropia, unspecified eye: Secondary | ICD-10-CM | POA: Diagnosis not present

## 2015-05-09 DIAGNOSIS — H52223 Regular astigmatism, bilateral: Secondary | ICD-10-CM | POA: Diagnosis not present

## 2015-05-14 DIAGNOSIS — E669 Obesity, unspecified: Secondary | ICD-10-CM | POA: Diagnosis not present

## 2015-05-16 DIAGNOSIS — L4050 Arthropathic psoriasis, unspecified: Secondary | ICD-10-CM | POA: Diagnosis not present

## 2015-05-18 ENCOUNTER — Encounter: Payer: Self-pay | Admitting: Internal Medicine

## 2015-05-25 ENCOUNTER — Encounter: Payer: Self-pay | Admitting: Internal Medicine

## 2015-05-25 NOTE — Telephone Encounter (Signed)
Dr Kirtland Bouchard , I see a flex sig 04/26/2002 but no endoscopy

## 2015-05-28 ENCOUNTER — Encounter: Payer: Self-pay | Admitting: Internal Medicine

## 2015-05-28 ENCOUNTER — Encounter: Payer: Self-pay | Admitting: Gastroenterology

## 2015-05-28 ENCOUNTER — Other Ambulatory Visit: Payer: Self-pay | Admitting: Internal Medicine

## 2015-05-28 DIAGNOSIS — R1013 Epigastric pain: Secondary | ICD-10-CM

## 2015-05-29 ENCOUNTER — Ambulatory Visit: Payer: Medicare Other | Admitting: Internal Medicine

## 2015-05-31 ENCOUNTER — Emergency Department (HOSPITAL_COMMUNITY): Payer: Medicare Other

## 2015-05-31 ENCOUNTER — Inpatient Hospital Stay (HOSPITAL_COMMUNITY)
Admission: EM | Admit: 2015-05-31 | Discharge: 2015-06-08 | DRG: 234 | Disposition: A | Discharge status: Skilled Nursing Facility | Payer: Medicare Other | Attending: Surgery | Admitting: Surgery

## 2015-05-31 ENCOUNTER — Encounter (HOSPITAL_COMMUNITY): Payer: Self-pay | Admitting: Vascular Surgery

## 2015-05-31 DIAGNOSIS — Z6832 Body mass index (BMI) 32.0-32.9, adult: Secondary | ICD-10-CM

## 2015-05-31 DIAGNOSIS — D62 Acute posthemorrhagic anemia: Secondary | ICD-10-CM | POA: Diagnosis not present

## 2015-05-31 DIAGNOSIS — Z9981 Dependence on supplemental oxygen: Secondary | ICD-10-CM

## 2015-05-31 DIAGNOSIS — Z8709 Personal history of other diseases of the respiratory system: Secondary | ICD-10-CM

## 2015-05-31 DIAGNOSIS — I492 Junctional premature depolarization: Secondary | ICD-10-CM | POA: Diagnosis not present

## 2015-05-31 DIAGNOSIS — K5900 Constipation, unspecified: Secondary | ICD-10-CM | POA: Diagnosis not present

## 2015-05-31 DIAGNOSIS — M069 Rheumatoid arthritis, unspecified: Secondary | ICD-10-CM | POA: Diagnosis present

## 2015-05-31 DIAGNOSIS — I209 Angina pectoris, unspecified: Secondary | ICD-10-CM

## 2015-05-31 DIAGNOSIS — Z8249 Family history of ischemic heart disease and other diseases of the circulatory system: Secondary | ICD-10-CM

## 2015-05-31 DIAGNOSIS — Z452 Encounter for adjustment and management of vascular access device: Secondary | ICD-10-CM | POA: Diagnosis not present

## 2015-05-31 DIAGNOSIS — E039 Hypothyroidism, unspecified: Secondary | ICD-10-CM | POA: Diagnosis present

## 2015-05-31 DIAGNOSIS — J8410 Pulmonary fibrosis, unspecified: Secondary | ICD-10-CM | POA: Diagnosis present

## 2015-05-31 DIAGNOSIS — J9610 Chronic respiratory failure, unspecified whether with hypoxia or hypercapnia: Secondary | ICD-10-CM | POA: Diagnosis present

## 2015-05-31 DIAGNOSIS — E669 Obesity, unspecified: Secondary | ICD-10-CM | POA: Diagnosis present

## 2015-05-31 DIAGNOSIS — L4050 Arthropathic psoriasis, unspecified: Secondary | ICD-10-CM | POA: Diagnosis present

## 2015-05-31 DIAGNOSIS — I5032 Chronic diastolic (congestive) heart failure: Secondary | ICD-10-CM | POA: Diagnosis present

## 2015-05-31 DIAGNOSIS — K219 Gastro-esophageal reflux disease without esophagitis: Secondary | ICD-10-CM | POA: Diagnosis present

## 2015-05-31 DIAGNOSIS — I25110 Atherosclerotic heart disease of native coronary artery with unstable angina pectoris: Secondary | ICD-10-CM | POA: Diagnosis present

## 2015-05-31 DIAGNOSIS — I214 Non-ST elevation (NSTEMI) myocardial infarction: Secondary | ICD-10-CM | POA: Diagnosis present

## 2015-05-31 DIAGNOSIS — Z951 Presence of aortocoronary bypass graft: Secondary | ICD-10-CM

## 2015-05-31 DIAGNOSIS — J811 Chronic pulmonary edema: Secondary | ICD-10-CM | POA: Diagnosis not present

## 2015-05-31 DIAGNOSIS — Z87891 Personal history of nicotine dependence: Secondary | ICD-10-CM | POA: Diagnosis not present

## 2015-05-31 DIAGNOSIS — D696 Thrombocytopenia, unspecified: Secondary | ICD-10-CM | POA: Diagnosis not present

## 2015-05-31 DIAGNOSIS — J45909 Unspecified asthma, uncomplicated: Secondary | ICD-10-CM | POA: Diagnosis present

## 2015-05-31 DIAGNOSIS — I2584 Coronary atherosclerosis due to calcified coronary lesion: Secondary | ICD-10-CM | POA: Diagnosis present

## 2015-05-31 DIAGNOSIS — Z96652 Presence of left artificial knee joint: Secondary | ICD-10-CM | POA: Diagnosis not present

## 2015-05-31 DIAGNOSIS — R0789 Other chest pain: Secondary | ICD-10-CM | POA: Diagnosis not present

## 2015-05-31 DIAGNOSIS — G479 Sleep disorder, unspecified: Secondary | ICD-10-CM | POA: Diagnosis present

## 2015-05-31 DIAGNOSIS — R079 Chest pain, unspecified: Secondary | ICD-10-CM | POA: Diagnosis not present

## 2015-05-31 DIAGNOSIS — J9811 Atelectasis: Secondary | ICD-10-CM | POA: Diagnosis not present

## 2015-05-31 DIAGNOSIS — R001 Bradycardia, unspecified: Secondary | ICD-10-CM

## 2015-05-31 DIAGNOSIS — E785 Hyperlipidemia, unspecified: Secondary | ICD-10-CM | POA: Diagnosis present

## 2015-05-31 DIAGNOSIS — J449 Chronic obstructive pulmonary disease, unspecified: Secondary | ICD-10-CM | POA: Diagnosis present

## 2015-05-31 DIAGNOSIS — K2270 Barrett's esophagus without dysplasia: Secondary | ICD-10-CM | POA: Diagnosis present

## 2015-05-31 DIAGNOSIS — I10 Essential (primary) hypertension: Secondary | ICD-10-CM | POA: Diagnosis not present

## 2015-05-31 DIAGNOSIS — Z96653 Presence of artificial knee joint, bilateral: Secondary | ICD-10-CM | POA: Diagnosis present

## 2015-05-31 DIAGNOSIS — I2510 Atherosclerotic heart disease of native coronary artery without angina pectoris: Secondary | ICD-10-CM | POA: Diagnosis not present

## 2015-05-31 DIAGNOSIS — I2583 Coronary atherosclerosis due to lipid rich plaque: Secondary | ICD-10-CM | POA: Diagnosis present

## 2015-05-31 DIAGNOSIS — I4892 Unspecified atrial flutter: Secondary | ICD-10-CM | POA: Diagnosis not present

## 2015-05-31 DIAGNOSIS — I253 Aneurysm of heart: Secondary | ICD-10-CM | POA: Diagnosis present

## 2015-05-31 DIAGNOSIS — Z9889 Other specified postprocedural states: Secondary | ICD-10-CM | POA: Diagnosis not present

## 2015-05-31 DIAGNOSIS — Z7951 Long term (current) use of inhaled steroids: Secondary | ICD-10-CM

## 2015-05-31 DIAGNOSIS — Z48812 Encounter for surgical aftercare following surgery on the circulatory system: Secondary | ICD-10-CM | POA: Diagnosis not present

## 2015-05-31 DIAGNOSIS — E038 Other specified hypothyroidism: Secondary | ICD-10-CM | POA: Diagnosis not present

## 2015-05-31 HISTORY — DX: Non-ST elevation (NSTEMI) myocardial infarction: I21.4

## 2015-05-31 LAB — PROTIME-INR
INR: 1.03 (ref 0.00–1.49)
INR: 1.08 (ref 0.00–1.49)
Prothrombin Time: 13.7 seconds (ref 11.6–15.2)
Prothrombin Time: 14.2 seconds (ref 11.6–15.2)

## 2015-05-31 LAB — I-STAT TROPONIN, ED: Troponin i, poc: 1.28 ng/mL (ref 0.00–0.08)

## 2015-05-31 LAB — BASIC METABOLIC PANEL
Anion gap: 13 (ref 5–15)
BUN: 27 mg/dL — ABNORMAL HIGH (ref 6–20)
CO2: 29 mmol/L (ref 22–32)
Calcium: 10.5 mg/dL — ABNORMAL HIGH (ref 8.9–10.3)
Chloride: 97 mmol/L — ABNORMAL LOW (ref 101–111)
Creatinine, Ser: 1.21 mg/dL — ABNORMAL HIGH (ref 0.44–1.00)
GFR calc Af Amer: 52 mL/min — ABNORMAL LOW (ref >60)
GFR calc non Af Amer: 45 mL/min — ABNORMAL LOW (ref >60)
Glucose, Bld: 111 mg/dL — ABNORMAL HIGH (ref 65–99)
Potassium: 3.4 mmol/L — ABNORMAL LOW (ref 3.5–5.1)
Sodium: 139 mmol/L (ref 135–145)

## 2015-05-31 LAB — CBC
HCT: 37.4 % (ref 36.0–46.0)
Hemoglobin: 12.3 g/dL (ref 12.0–15.0)
MCH: 28.7 pg (ref 26.0–34.0)
MCHC: 32.9 g/dL (ref 30.0–36.0)
MCV: 87.4 fL (ref 78.0–100.0)
Platelets: 286 10*3/uL (ref 150–400)
RBC: 4.28 MIL/uL (ref 3.87–5.11)
RDW: 14.3 % (ref 11.5–15.5)
WBC: 8.8 10*3/uL (ref 4.0–10.5)

## 2015-05-31 LAB — APTT: aPTT: 32 seconds (ref 24–37)

## 2015-05-31 LAB — TROPONIN I: Troponin I: 3.47 ng/mL (ref <0.031)

## 2015-05-31 LAB — T4, FREE: Free T4: 2.16 ng/dL — ABNORMAL HIGH (ref 0.61–1.12)

## 2015-05-31 LAB — MAGNESIUM: Magnesium: 1.8 mg/dL (ref 1.7–2.4)

## 2015-05-31 LAB — TSH: TSH: 0.101 u[IU]/mL — ABNORMAL LOW (ref 0.350–4.500)

## 2015-05-31 MED ORDER — SODIUM CHLORIDE 0.9 % IJ SOLN: 3.0000 mL | Freq: Two times a day (BID) | INTRAMUSCULAR | Status: DC

## 2015-05-31 MED ORDER — ASPIRIN 81 MG PO CHEW
324.0000 mg | CHEWABLE_TABLET | Freq: Once | ORAL | Status: AC
  Administered 2015-05-31: 324 mg via ORAL
  Filled 2015-05-31: qty 4

## 2015-05-31 MED ORDER — BUDESONIDE-FORMOTEROL FUMARATE 160-4.5 MCG/ACT IN AERO
2.0000 | INHALATION_SPRAY | Freq: Two times a day (BID) | RESPIRATORY_TRACT | Status: DC
  Administered 2015-05-31 – 2015-06-08 (×15): 2 via RESPIRATORY_TRACT
  Filled 2015-05-31 (×2): qty 6

## 2015-05-31 MED ORDER — ACETAMINOPHEN ER 650 MG PO TBCR
1300.0000 mg | EXTENDED_RELEASE_TABLET | Freq: Three times a day (TID) | ORAL | Status: DC
  Filled 2015-05-31 (×2): qty 2

## 2015-05-31 MED ORDER — ASPIRIN EC 81 MG PO TBEC
81.0000 mg | DELAYED_RELEASE_TABLET | Freq: Every day | ORAL | Status: DC
  Filled 2015-05-31: qty 1

## 2015-05-31 MED ORDER — NITROGLYCERIN 0.4 MG SL SUBL: 0.4000 mg | SUBLINGUAL_TABLET | SUBLINGUAL | Status: DC | PRN

## 2015-05-31 MED ORDER — CETYLPYRIDINIUM CHLORIDE 0.05 % MT LIQD
7.0000 mL | Freq: Two times a day (BID) | OROMUCOSAL | Status: DC
  Administered 2015-05-31 – 2015-06-03 (×7): 7 mL via OROMUCOSAL

## 2015-05-31 MED ORDER — ACETAMINOPHEN 325 MG PO TABS: 650.0000 mg | ORAL_TABLET | ORAL | Status: DC | PRN

## 2015-05-31 MED ORDER — SODIUM CHLORIDE 0.9 % WEIGHT BASED INFUSION
1.0000 mL/kg/h | INTRAVENOUS | Status: DC
  Administered 2015-06-01: 1 mL/kg/h via INTRAVENOUS

## 2015-05-31 MED ORDER — NITROGLYCERIN 0.4 MG SL SUBL
0.4000 mg | SUBLINGUAL_TABLET | SUBLINGUAL | Status: DC | PRN
  Administered 2015-05-31: 0.4 mg via SUBLINGUAL
  Filled 2015-05-31: qty 1

## 2015-05-31 MED ORDER — ATORVASTATIN CALCIUM 80 MG PO TABS
80.0000 mg | ORAL_TABLET | Freq: Every day | ORAL | Status: DC
  Administered 2015-06-01 – 2015-06-07 (×6): 80 mg via ORAL
  Filled 2015-05-31 (×7): qty 1

## 2015-05-31 MED ORDER — ASPIRIN 81 MG PO CHEW
324.0000 mg | CHEWABLE_TABLET | ORAL | Status: AC
  Filled 2015-05-31: qty 4

## 2015-05-31 MED ORDER — SODIUM CHLORIDE 0.9 % WEIGHT BASED INFUSION: 3.0000 mL/kg/h | INTRAVENOUS | Status: DC

## 2015-05-31 MED ORDER — HEPARIN BOLUS VIA INFUSION
3500.0000 [IU] | Freq: Once | INTRAVENOUS | Status: AC
  Administered 2015-05-31: 3500 [IU] via INTRAVENOUS
  Filled 2015-05-31: qty 3500

## 2015-05-31 MED ORDER — AMLODIPINE BESYLATE 10 MG PO TABS
10.0000 mg | ORAL_TABLET | Freq: Every day | ORAL | Status: DC
  Administered 2015-06-01 – 2015-06-03 (×3): 10 mg via ORAL
  Filled 2015-05-31 (×3): qty 1

## 2015-05-31 MED ORDER — METOPROLOL TARTRATE 12.5 MG HALF TABLET
12.5000 mg | ORAL_TABLET | Freq: Two times a day (BID) | ORAL | Status: DC
  Administered 2015-05-31 – 2015-06-01 (×2): 12.5 mg via ORAL
  Filled 2015-05-31 (×2): qty 1

## 2015-05-31 MED ORDER — PANTOPRAZOLE SODIUM 40 MG PO TBEC
40.0000 mg | DELAYED_RELEASE_TABLET | Freq: Two times a day (BID) | ORAL | Status: DC
  Administered 2015-05-31 – 2015-06-05 (×9): 40 mg via ORAL
  Filled 2015-05-31 (×9): qty 1

## 2015-05-31 MED ORDER — ASPIRIN 300 MG RE SUPP: 300.0000 mg | RECTAL | Status: AC

## 2015-05-31 MED ORDER — TRAMADOL HCL 50 MG PO TABS
100.0000 mg | ORAL_TABLET | Freq: Every day | ORAL | Status: DC
  Administered 2015-06-01 – 2015-06-03 (×3): 100 mg via ORAL
  Filled 2015-05-31 (×3): qty 2

## 2015-05-31 MED ORDER — CITALOPRAM HYDROBROMIDE 40 MG PO TABS
40.0000 mg | ORAL_TABLET | Freq: Every day | ORAL | Status: DC
  Administered 2015-06-01 – 2015-06-07 (×6): 40 mg via ORAL
  Filled 2015-05-31: qty 2
  Filled 2015-05-31 (×3): qty 1
  Filled 2015-05-31: qty 2
  Filled 2015-05-31: qty 1
  Filled 2015-05-31: qty 2

## 2015-05-31 MED ORDER — ALPRAZOLAM 0.25 MG PO TABS
0.2500 mg | ORAL_TABLET | Freq: Two times a day (BID) | ORAL | Status: DC | PRN
  Administered 2015-06-01 – 2015-06-03 (×2): 0.25 mg via ORAL
  Filled 2015-05-31 (×2): qty 1

## 2015-05-31 MED ORDER — LEVOTHYROXINE SODIUM 100 MCG PO TABS
200.0000 ug | ORAL_TABLET | Freq: Every day | ORAL | Status: DC
  Administered 2015-06-01: 200 ug via ORAL
  Filled 2015-05-31: qty 2

## 2015-05-31 MED ORDER — TRAMADOL HCL 50 MG PO TABS
50.0000 mg | ORAL_TABLET | Freq: Every day | ORAL | Status: DC
  Administered 2015-06-01 – 2015-06-03 (×3): 50 mg via ORAL
  Filled 2015-05-31 (×3): qty 1

## 2015-05-31 MED ORDER — ASPIRIN 81 MG PO CHEW
81.0000 mg | CHEWABLE_TABLET | ORAL | Status: AC
  Administered 2015-06-01: 81 mg via ORAL
  Filled 2015-05-31: qty 1

## 2015-05-31 MED ORDER — ACETAMINOPHEN 325 MG PO TABS
1300.0000 mg | ORAL_TABLET | Freq: Three times a day (TID) | ORAL | Status: DC
  Administered 2015-05-31: 1300 mg via ORAL
  Administered 2015-06-01: 325 mg via ORAL
  Filled 2015-05-31 (×2): qty 4

## 2015-05-31 MED ORDER — NITROGLYCERIN 2 % TD OINT
0.5000 [in_us] | TOPICAL_OINTMENT | Freq: Three times a day (TID) | TRANSDERMAL | Status: DC
Start: 2015-05-31 — End: 2015-06-01
  Administered 2015-05-31 – 2015-06-01 (×2): 0.5 [in_us] via TOPICAL
  Filled 2015-05-31: qty 30

## 2015-05-31 MED ORDER — SODIUM CHLORIDE 0.9 % IV SOLN
1000.0000 mL | INTRAVENOUS | Status: DC
  Administered 2015-05-31: 1000 mL via INTRAVENOUS

## 2015-05-31 MED ORDER — SODIUM CHLORIDE 0.9 % IV SOLN
INTRAVENOUS | Status: DC
  Administered 2015-05-31: 22:00:00 via INTRAVENOUS

## 2015-05-31 MED ORDER — ONDANSETRON HCL 4 MG/2ML IJ SOLN: 4.0000 mg | Freq: Four times a day (QID) | INTRAMUSCULAR | Status: DC | PRN

## 2015-05-31 MED ORDER — PNEUMOCOCCAL VAC POLYVALENT 25 MCG/0.5ML IJ INJ
0.5000 mL | INJECTION | INTRAMUSCULAR | Status: DC
  Filled 2015-05-31: qty 0.5

## 2015-05-31 MED ORDER — SODIUM CHLORIDE 0.9 % IJ SOLN: 3.0000 mL | INTRAMUSCULAR | Status: DC | PRN

## 2015-05-31 MED ORDER — HEPARIN (PORCINE) IN NACL 100-0.45 UNIT/ML-% IJ SOLN
950.0000 [IU]/h | INTRAMUSCULAR | Status: DC
  Administered 2015-05-31: 750 [IU]/h via INTRAVENOUS
  Filled 2015-05-31: qty 250

## 2015-05-31 MED ORDER — SODIUM CHLORIDE 0.9 % IV SOLN: 250.0000 mL | INTRAVENOUS | Status: DC | PRN

## 2015-05-31 MED ORDER — ZOLPIDEM TARTRATE 5 MG PO TABS
5.0000 mg | ORAL_TABLET | Freq: Every evening | ORAL | Status: DC | PRN
  Administered 2015-05-31 – 2015-06-01 (×2): 5 mg via ORAL
  Filled 2015-05-31 (×2): qty 1

## 2015-05-31 NOTE — ED Provider Notes (Signed)
CSN: [id number]     Arrival date & time 05/31/15  1616 History   First MD Initiated Contact with Patient 05/31/15 1720     Chief Complaint  Patient presents with  . Chest Pain     Patient is a 69 y.o. female presenting with chest pain. The history is provided by the patient.  Chest Pain Pain quality: burning   Duration:  3 weeks Timing:  Constant Chronicity:  New (started several weeks ago.  But today the pain was more intense , burning in chest and shoulders) Relieved by:  Nothing (tried antacids but no relief, referred to GI) Associated symptoms: diaphoresis, nausea and near-syncope   Associated symptoms: no fever, no palpitations and not vomiting   Risk factors: hypertension   The sx resolved at this point.  The symptoms started at 0830 and lasted a few hours.  It returned this afternoon and started a few hours again.    Past Medical History  Diagnosis Date  . Psoriatic arthritis   . Rheumatic disease   . Hypertension   . Hypothyroidism   . Barrett esophagus   . COPD (chronic obstructive pulmonary disease)   . Chronic respiratory failure   . Pulmonary fibrosis   . GERD (gastroesophageal reflux disease)   . Shortness of breath     WITH EXERTION  . History of home oxygen therapy     AT NIGHT WHEN SLEEPING 2 L / MIN NASAL CANNULA  . History of ARDS 2006  . Pneumococcal pneumonia 2006    HOSPITALIZED AND DEVELOPED ARDS  . Arthritis     OA RIGHT KNEE WITH PAIN   Past Surgical History  Procedure Laterality Date  . Carpal tunnel release    . Cosmetic breast surgery    . Abdominoplasty    . Cholecystectomy    . Left knee arthroscopic    . Left total knee replacement    . Joint replacement    . Total knee arthroplasty Right 06/06/2013    Procedure: RIGHT TOTAL KNEE ARTHROPLASTY;  Surgeon: Loanne Drilling, MD;  Location: [facility];  Service: Orthopedics;  Laterality: Right;   Family History  Problem Relation Age of Onset  . Coronary artery disease Father   . Breast cancer  Mother   . Rheum arthritis Father    History  Substance Use Topics  . Smoking status: Former Smoker -- 1.50 packs/day for 58.5 years    Types: Cigarettes    Quit date: 03/03/2005  . Smokeless tobacco: Never Used  . Alcohol Use: No   OB History    No data available     Review of Systems  Constitutional: Positive for diaphoresis. Negative for fever.  Cardiovascular: Positive for chest pain and near-syncope. Negative for palpitations.  Gastrointestinal: Positive for nausea. Negative for vomiting and blood in stool.  All other systems reviewed and are negative.     Allergies  Review of patient's allergies indicates no known allergies.  Home Medications   Prior to Admission medications   Medication Sig Start Date End Date Taking? Authorizing Provider  acetaminophen (TYLENOL ARTHRITIS PAIN) 650 MG CR tablet Take 1,300 mg by mouth every 8 (eight) hours as needed for pain. Per bottle directions as needed   Yes Historical Provider, MD  amLODipine (NORVASC) 10 MG tablet Take 1 tablet by mouth  every morning 03/26/15  Yes Gordy Savers, MD  budesonide-formoterol [facility]) 160-4.5 MCG/ACT inhaler Inhale 2 puffs into the lungs 2 (two) times daily. 04/19/15  Yes Charlaine Dalton  Wert, MD  citalopram (CELEXA) 40 MG tablet Take 1 tablet by mouth  every morning Patient taking differently: Take 40 mg by mouth daily.  03/26/15  Yes Gordy Savers, MD  levothyroxine (SYNTHROID, LEVOTHROID) 200 MCG tablet Take 1 tablet by mouth  every day before breakfast Patient taking differently: Take 200 mcg by mouth daily before breakfast.  01/18/15  Yes Gordy Savers, MD  losartan-hydrochlorothiazide [facility]) 100-25 MG per tablet Take 1 tablet by mouth  every morning 03/26/15  Yes Gordy Savers, MD  pantoprazole (PROTONIX) 40 MG tablet Take 1 tablet by mouth  daily Patient taking differently: Take 40 mg by mouth 2 (two) times daily.  03/26/15  Yes Gordy Savers, MD  phentermine (ADIPEX-P)  37.5 MG tablet Take 37.5 mg by mouth daily. 05/17/15  Yes Historical Provider, MD  traMADol (ULTRAM) 50 MG tablet Take one tablet in the am, and 2 tablets at lunch, and 1 tablet with supper 02/08/15  Yes Kristian Covey, MD  triamcinolone cream (KENALOG) 0.1 % Apply 1 application topically 2 (two) times daily as needed (for psorisis).   Yes Historical Provider, MD  UNABLE TO FIND Med Name: Cimzia per Dr. Dierdre Forth   Yes Historical Provider, MD  omeprazole (PRILOSEC) 20 MG capsule Take 1 capsule (20 mg total) by mouth every morning. Patient not taking: Reported on 05/31/2015 08/19/13   Gordy Savers, MD   BP 102/75 mmHg  Pulse 98  Temp(Src) 98.2 F (36.8 C) (Oral)  Resp 21  SpO2 97% Physical Exam  Constitutional: She appears well-developed and well-nourished. No distress.  HENT:  Head: Normocephalic and atraumatic.  Right Ear: External ear normal.  Left Ear: External ear normal.  Eyes: Conjunctivae are normal. Right eye exhibits no discharge. Left eye exhibits no discharge. No scleral icterus.  Neck: Neck supple. No tracheal deviation present.  Cardiovascular: Normal rate, regular rhythm and intact distal pulses.   Pulmonary/Chest: Effort normal and breath sounds normal. No stridor. No respiratory distress. She has no wheezes. She has no rales.  Abdominal: Soft. Bowel sounds are normal. She exhibits no distension. There is no tenderness. There is no rebound and no guarding.  Musculoskeletal: She exhibits no edema or tenderness.  Neurological: She is alert. She has normal strength. No cranial nerve deficit (no facial droop, extraocular movements intact, no slurred speech) or sensory deficit. She exhibits normal muscle tone. She displays no seizure activity. Coordination normal.  Skin: Skin is warm and dry. No rash noted.  Psychiatric: Her mood appears anxious.  Nursing note and vitals reviewed.   ED Course  Procedures (including critical care time) CRITICAL CARE Performed by:  HUDJS,HFW Total critical care time: 35 Critical care time was exclusive of separately billable procedures and treating other patients. Critical care was necessary to treat or prevent imminent or life-threatening deterioration. Critical care was time spent personally by me on the following activities: development of treatment plan with patient and/or surrogate as well as nursing, discussions with consultants, evaluation of patient's response to treatment, examination of patient, obtaining history from patient or surrogate, ordering and performing treatments and interventions, ordering and review of laboratory studies, ordering and review of radiographic studies, pulse oximetry and re-evaluation of patient's condition.  Labs Review Labs Reviewed  BASIC METABOLIC PANEL - Abnormal; Notable for the following:    Potassium 3.4 (*)    Chloride 97 (*)    Glucose, Bld 111 (*)    BUN 27 (*)    Creatinine, Ser 1.21 (*)  Calcium 10.5 (*)    GFR calc non Af Amer 45 (*)    GFR calc Af Amer 52 (*)    All other components within normal limits  I-STAT TROPOININ, ED - Abnormal; Notable for the following:    Troponin i, poc 1.28 (*)    All other components within normal limits  CBC  APTT  PROTIME-INR    Imaging Review Dg Chest 2 View  05/31/2015   CLINICAL DATA:  Severe are current and upper chest pain for 3 weeks.  EXAM: CHEST  2 VIEW  COMPARISON:  6/2 8/60  FINDINGS: Heart size is normal. No pleural effusion identified. No interstitial edema or airspace consolidation. Chronic bronchitic changes are noted bilaterally.  IMPRESSION: 1. Bronchitic changes.   Electronically Signed   By: Signa Kell M.D.   On: 05/31/2015 17:02     EKG Interpretation   Date/Time:  Thursday May 31 2015 16:24:55 EDT Ventricular Rate:  102 PR Interval:  162 QRS Duration: 98 QT Interval:  346 QTC Calculation: 450 R Axis:   92 Text Interpretation:  Sinus tachycardia Rightward axis Incomplete right  bundle branch  block Possible Inferior infarct , age undetermined Cannot  rule out Anterior infarct , age undetermined No significant change since  last tracing Confirmed by [facility]  MD, Alphonzo Lemmings ([id number]) on 05/31/2015  5:23:59 PM     Medications  0.9 %  sodium chloride infusion (not administered)  aspirin chewable tablet 324 mg (not administered)  nitroGLYCERIN (NITROSTAT) SL tablet 0.4 mg (not administered)    MDM   Final diagnoses:  NSTEMI (non-ST elevated myocardial infarction)    During my evaluation the patient does not experience any symptoms. At 1750 the nurse notified me the patient said that she was started having some burning in her chest again. I have ordered IV heparin infusion. I will give her some nitroglycerin. Aspirin ordered as well. Plan on consultation with cardiology. Patient appears to be having a non-ST elevation MI.    Linwood Dibbles, MD 05/31/15 1754

## 2015-05-31 NOTE — Progress Notes (Signed)
ANTICOAGULATION CONSULT NOTE - Initial Consult  Pharmacy Consult for Heparin Indication: chest pain/ACS  No Known Allergies  Patient Measurements: Weight: 163 lb 3.2 oz (74.027 kg)  IBW ~45.5 kg Heparin Dosing Weight: 62 kg  Vital Signs: Temp: 98.2 F (36.8 C) (07/28 1630) Temp Source: Oral (07/28 1630) BP: 114/64 mmHg (07/28 1754) Pulse Rate: 93 (07/28 1754)  Labs:  Recent Labs  05/31/15 1700  HGB 12.3  HCT 37.4  PLT 286  CREATININE 1.21*    Estimated Creatinine Clearance: 39.5 mL/min (by C-G formula based on Cr of 1.21).   Medical History: Past Medical History  Diagnosis Date  . Psoriatic arthritis   . Rheumatic disease   . Hypertension   . Hypothyroidism   . Barrett esophagus   . COPD (chronic obstructive pulmonary disease)   . Chronic respiratory failure   . Pulmonary fibrosis   . GERD (gastroesophageal reflux disease)   . Shortness of breath     WITH EXERTION  . History of home oxygen therapy     AT NIGHT WHEN SLEEPING 2 L / MIN NASAL CANNULA  . History of ARDS 2006  . Pneumococcal pneumonia 2006    HOSPITALIZED AND DEVELOPED ARDS  . Arthritis     OA RIGHT KNEE WITH PAIN   Assessment: 66 YOF who presented to the [facility] on 7/28 with CP and burning - initially thought to be related to GERD however positive troponins were also noted. Pharmacy was consulted to start heparin for anticoagulation with NSTEMI. Hep wt: 62 kg  The patient was not noted to be on any blood thinners PTA, n recent surgeries or CVA noted.   Goal of Therapy:  Heparin level 0.3-0.7 units/ml Monitor platelets by anticoagulation protocol: Yes   Plan:  1. Heparin bolus of 3500 unit x 1 2. Initiate heparin at a rate of 750 units/hr 3. Daily heparin levels, CBC 4. Will continue to monitor for any signs/symptoms of bleeding and will follow up with heparin level in 6 hours   Georgina Pillion, PharmD, BCPS Clinical Pharmacist Pager: [phone] 05/31/2015 7:11 PM

## 2015-05-31 NOTE — H&P (Addendum)
[name] is an 68 y.o. female.    Primary Cardiologist:Dr. Lovena Le PCP: Nyoka Cowden, MD  Chief Complaint: chest pain  HPI:  69 year old female presents to ER with ongoing chest pain and now elevated troponins.  She has hx of diastolic CHF, COPD with prior tobacco use, pulmonary fibrosis, sleep disorder and barrett's esophagus.  Echo 2011, with atrial septal aneurysm G1DD, mild MR EF 55-60%.    She has been having chest burning for 3 weeks.  Began with chest burning and thought it was indigestions.  PCP increased her PPI to BID but no relief,  She has Hx of GERD and per chart Barrett's esophagus- she did not know that name.     The reflux has increased with Sat night it woke her from sleep and went to her back.  + SOB and Nausea and diaphoresis, at times deep breathing helped.  Lasting up to 2 hours.  Today at least 2 episodes of up to 2 hours scored 10/10 - her daughter was present so came to ER no acute changes on EKG but POC troponin 1.28.  NTG in ER with relief.  Pt's husband is disabled and she is stressed that she should stay in the hospital.  She wear oxygen prn during the day but continuous at HS.  Past Medical History  Diagnosis Date  . Psoriatic arthritis   . Rheumatic disease   . Hypertension   . Hypothyroidism   . Barrett esophagus   . COPD (chronic obstructive pulmonary disease)   . Chronic respiratory failure   . Pulmonary fibrosis   . GERD (gastroesophageal reflux disease)   . Shortness of breath     WITH EXERTION  . History of home oxygen therapy     AT NIGHT WHEN SLEEPING 2 L / MIN NASAL CANNULA  . History of ARDS 2006  . Pneumococcal pneumonia 2006    HOSPITALIZED AND DEVELOPED ARDS  . Arthritis     OA RIGHT KNEE WITH PAIN    Past Surgical History  Procedure Laterality Date  . Carpal tunnel release    . Cosmetic breast surgery    . Abdominoplasty    . Cholecystectomy    . Left knee arthroscopic    . Left total knee  replacement    . Joint replacement    . Total knee arthroplasty Right 06/06/2013    Procedure: RIGHT TOTAL KNEE ARTHROPLASTY;  Surgeon: Gearlean Alf, MD;  Location: [facility];  Service: Orthopedics;  Laterality: Right;    Family History  Problem Relation Age of Onset  . Coronary artery disease Father   . Breast cancer Mother   . Rheum arthritis Father    Social History:  reports that she quit smoking about 10 years ago. Her smoking use included Cigarettes. She has a 87.75 pack-year smoking history. She has never used smokeless tobacco. She reports that she does not drink alcohol or use illicit drugs.  Allergies: No Known Allergies  OUTPATIENT MEDICATIONS: No current facility-administered medications on file prior to encounter.   Current Outpatient Prescriptions on File Prior to Encounter  Medication Sig Dispense Refill  . acetaminophen (TYLENOL ARTHRITIS PAIN) 650 MG CR tablet Take 1,300 mg by mouth every 8 (eight) hours as needed for pain. Per bottle directions as needed    . amLODipine (NORVASC) 10 MG tablet Take 1 tablet by mouth  every morning 90 tablet 1  . budesonide-formoterol (SYMBICORT) 160-4.5 MCG/ACT inhaler Inhale 2  puffs into the lungs 2 (two) times daily. 3 Inhaler 3  . citalopram (CELEXA) 40 MG tablet Take 1 tablet by mouth  every morning (Patient taking differently: Take 40 mg by mouth daily. ) 90 tablet 1  . levothyroxine (SYNTHROID, LEVOTHROID) 200 MCG tablet Take 1 tablet by mouth  every day before breakfast (Patient taking differently: Take 200 mcg by mouth daily before breakfast. ) 90 tablet 1  . losartan-hydrochlorothiazide (HYZAAR) 100-25 MG per tablet Take 1 tablet by mouth  every morning 90 tablet 1  . pantoprazole (PROTONIX) 40 MG tablet Take 1 tablet by mouth  daily (Patient taking differently: Take 40 mg by mouth 2 (two) times daily. ) 90 tablet 1  . traMADol (ULTRAM) 50 MG tablet Take one tablet in the am, and 2 tablets at lunch, and 1 tablet with supper 360  tablet 1  . triamcinolone cream (KENALOG) 0.1 % Apply 1 application topically 2 (two) times daily as needed (for psorisis).    Marland Kitchen UNABLE TO FIND Med Name: Cimzia per Dr. Amil Amen    . omeprazole (PRILOSEC) 20 MG capsule Take 1 capsule (20 mg total) by mouth every morning. (Patient not taking: Reported on 05/31/2015) 90 capsule 1     Results for orders placed or performed during the hospital encounter of 05/31/15 (from the past 48 hour(s))  Basic metabolic panel     Status: Abnormal   Collection Time: 05/31/15  5:00 PM  Result Value Ref Range   Sodium 139 135 - 145 mmol/L   Potassium 3.4 (L) 3.5 - 5.1 mmol/L   Chloride 97 (L) 101 - 111 mmol/L   CO2 29 22 - 32 mmol/L   Glucose, Bld 111 (H) 65 - 99 mg/dL   BUN 27 (H) 6 - 20 mg/dL   Creatinine, Ser 1.21 (H) 0.44 - 1.00 mg/dL   Calcium 10.5 (H) 8.9 - 10.3 mg/dL   GFR calc non Af Amer 45 (L) >60 mL/min   GFR calc Af Amer 52 (L) >60 mL/min    Comment: (NOTE) The eGFR has been calculated using the CKD EPI equation. This calculation has not been validated in all clinical situations. eGFR's persistently <60 mL/min signify possible Chronic Kidney Disease.    Anion gap 13 5 - 15  CBC     Status: None   Collection Time: 05/31/15  5:00 PM  Result Value Ref Range   WBC 8.8 4.0 - 10.5 K/uL   RBC 4.28 3.87 - 5.11 MIL/uL   Hemoglobin 12.3 12.0 - 15.0 g/dL   HCT 37.4 36.0 - 46.0 %   MCV 87.4 78.0 - 100.0 fL   MCH 28.7 26.0 - 34.0 pg   MCHC 32.9 30.0 - 36.0 g/dL   RDW 14.3 11.5 - 15.5 %   Platelets 286 150 - 400 K/uL  I-stat troponin, ED (not at [facility], [facility])     Status: Abnormal   Collection Time: 05/31/15  5:06 PM  Result Value Ref Range   Troponin i, poc 1.28 (HH) 0.00 - 0.08 ng/mL   Comment NOTIFIED PHYSICIAN    Comment 3            Comment: Due to the release kinetics of cTnI, a negative result within the first hours of the onset of symptoms does not rule out myocardial infarction with certainty. If myocardial infarction is still  suspected, repeat the test at appropriate intervals.    Dg Chest 2 View  05/31/2015   CLINICAL DATA:  Severe are current and upper  chest pain for 3 weeks.  EXAM: CHEST  2 VIEW  COMPARISON:  6/2 8/60  FINDINGS: Heart size is normal. No pleural effusion identified. No interstitial edema or airspace consolidation. Chronic bronchitic changes are noted bilaterally.  IMPRESSION: 1. Bronchitic changes.   Electronically Signed   By: Kerby Moors M.D.   On: 05/31/2015 17:02    ROS: General:no colds or fevers, 40 lb weight loss,  Skin:no rashes or ulcers HEENT:no blurred vision, no congestion CV:see HPI PUL:see HPI GI:no diarrhea constipation or melena, no indigestion GU:no hematuria, no dysuria MS:no joint pain, no claudication Neuro:no syncope, no lightheadedness Endo:no diabetes, + thyroid disease   Blood pressure 114/64, pulse 93, temperature 98.2 F (36.8 C), temperature source Oral, resp. rate 16, weight 163 lb 3.2 oz (74.027 kg), SpO2 94 %. PE: General:Pleasant affect, NAD Skin:Warm and dry, brisk capillary refill HEENT:normocephalic, sclera clear, mucus membranes moist Neck:supple, mild JVD, no bruits, no admenopathy  Heart:S1S2 RRR without murmur, gallup, rub or click Lungs:diminished breath sounds without rales, rhonchi, or wheezes BUL:AGTX, non tender, + BS, do not palpate liver spleen or masses Ext:no lower ext edema, 2+ pedal pulses, 2+ radial pulses Neuro:alert and oriented X 3, MAE, follows commands, + facial symmetry    Assessment/Plan Principal Problem:   NSTEMI (non-ST elevated myocardial infarction) Active Problems:   Hypothyroidism   Essential hypertension   PULMONARY FIBROSIS ILD POST INFLAMMATORY CHRONIC   History of home oxygen therapy   GERD (gastroesophageal reflux disease)   Barrett's esophagus  Will admit, add statin, IV heparin, serial troponins, oxygen at hs and prn, plan cardiac cath in AM and will get Echo.  Check TSH. Replace K+ No BB due to  COPD  [facility] Nurse Practitioner Certified [place] Pager [phone] or after 5pm or weekends call [phone] 05/31/2015, 6:23 PM  The patient was seen and examined, and I agree with the assessment and plan as documented above, with modifications as noted below. 69 yr old woman with aforementioned past medical history which includes COPD, possible pulmonary fibrosis, rheumatoid and psoriatic arthritis, history of tobacco abuse since teenager (quit in 2006), GERD, and hypertension admitted with progressive chest pain occurring at rest. Retrosternal with radiation to neck and jaw as well as right scapular region. Worst episodes in past 3 weeks occurred last Saturday and today. Given one SL nitro in ED with pain relief. Husband is a patient of Dr. Meda Coffee with cardiac amyloidosis.  POC troponin 1.28. Will check serial serum troponins and plan for coronary angiography on 7/29. Will initiate low-dose metoprolol 12.5 mg bid and see if she tolerates given COPD.  Obtain LFT's given methotrexate use. Higher incidence of MI's/CAD in patients with rheumatologic autoimmune disease. Will hold ARB-HCTZ for now given low normal BP but could consider reinstituting later in the course of hospitalization. Will give ASA and statin, heparin already started. Obtain echocardiogram. Patient and daughter in agreement with this plan.

## 2015-05-31 NOTE — ED Notes (Signed)
Pt report to the ED for eval of CP and burning. Pt also reports throat burning. She reports her PCP thought it was possibly heartburn so she has been increasing her acid reflux medication however it is not helping. Reports the pain radiates into her right shoulder. She also reports diaphoresis and N/V. Pt denies any association with food intake. Pt A&Ox4, resp e/u, and skin warm and dry.

## 2015-06-01 ENCOUNTER — Other Ambulatory Visit: Payer: Self-pay | Admitting: *Deleted

## 2015-06-01 ENCOUNTER — Inpatient Hospital Stay (HOSPITAL_COMMUNITY): Payer: Medicare Other

## 2015-06-01 ENCOUNTER — Encounter (HOSPITAL_COMMUNITY)
Admission: EM | Disposition: A | Discharge status: Skilled Nursing Facility | Payer: Self-pay | Source: Home / Self Care | Attending: Cardiovascular Disease

## 2015-06-01 DIAGNOSIS — I2510 Atherosclerotic heart disease of native coronary artery without angina pectoris: Secondary | ICD-10-CM

## 2015-06-01 DIAGNOSIS — R001 Bradycardia, unspecified: Secondary | ICD-10-CM

## 2015-06-01 DIAGNOSIS — E038 Other specified hypothyroidism: Secondary | ICD-10-CM

## 2015-06-01 DIAGNOSIS — R079 Chest pain, unspecified: Secondary | ICD-10-CM

## 2015-06-01 HISTORY — DX: Bradycardia, unspecified: R00.1

## 2015-06-01 HISTORY — PX: CARDIAC CATHETERIZATION: SHX172

## 2015-06-01 LAB — HEPATIC FUNCTION PANEL
ALT: 16 U/L (ref 14–54)
AST: 26 U/L (ref 15–41)
Albumin: 3.3 g/dL — ABNORMAL LOW (ref 3.5–5.0)
Alkaline Phosphatase: 46 U/L (ref 38–126)
Bilirubin, Direct: <0.1 mg/dL — ABNORMAL LOW (ref 0.1–0.5)
Total Bilirubin: 0.5 mg/dL (ref 0.3–1.2)
Total Protein: 6.4 g/dL — ABNORMAL LOW (ref 6.5–8.1)

## 2015-06-01 LAB — TROPONIN I
Troponin I: 2.26 ng/mL (ref <0.031)
Troponin I: 1.67 ng/mL (ref <0.031)

## 2015-06-01 LAB — BASIC METABOLIC PANEL
Anion gap: 11 (ref 5–15)
BUN: 24 mg/dL — ABNORMAL HIGH (ref 6–20)
CO2: 28 mmol/L (ref 22–32)
Calcium: 9.6 mg/dL (ref 8.9–10.3)
Chloride: 99 mmol/L — ABNORMAL LOW (ref 101–111)
Creatinine, Ser: 1.01 mg/dL — ABNORMAL HIGH (ref 0.44–1.00)
GFR calc Af Amer: >60 mL/min (ref >60)
GFR calc non Af Amer: 56 mL/min — ABNORMAL LOW (ref >60)
Glucose, Bld: 111 mg/dL — ABNORMAL HIGH (ref 65–99)
Potassium: 3.3 mmol/L — ABNORMAL LOW (ref 3.5–5.1)
Sodium: 138 mmol/L (ref 135–145)

## 2015-06-01 LAB — CBC
HCT: 32.7 % — ABNORMAL LOW (ref 36.0–46.0)
Hemoglobin: 10.7 g/dL — ABNORMAL LOW (ref 12.0–15.0)
MCH: 29.2 pg (ref 26.0–34.0)
MCHC: 32.7 g/dL (ref 30.0–36.0)
MCV: 89.1 fL (ref 78.0–100.0)
Platelets: 220 10*3/uL (ref 150–400)
RBC: 3.67 MIL/uL — ABNORMAL LOW (ref 3.87–5.11)
RDW: 14.4 % (ref 11.5–15.5)
WBC: 7.7 10*3/uL (ref 4.0–10.5)

## 2015-06-01 LAB — HEPARIN LEVEL (UNFRACTIONATED)
Heparin Unfractionated: 0.16 IU/mL — ABNORMAL LOW (ref 0.30–0.70)
Heparin Unfractionated: 0.36 IU/mL (ref 0.30–0.70)

## 2015-06-01 LAB — LIPID PANEL
Cholesterol: 149 mg/dL (ref 0–200)
HDL: 34 mg/dL — ABNORMAL LOW (ref >40)
LDL Cholesterol: 90 mg/dL (ref 0–99)
Total CHOL/HDL Ratio: 4.4 RATIO
Triglycerides: 126 mg/dL (ref <150)
VLDL: 25 mg/dL (ref 0–40)

## 2015-06-01 LAB — MRSA PCR SCREENING: MRSA by PCR: NEGATIVE

## 2015-06-01 SURGERY — LEFT HEART CATH AND CORONARY ANGIOGRAPHY
Anesthesia: LOCAL

## 2015-06-01 MED ORDER — ATORVASTATIN CALCIUM 80 MG PO TABS: 80.0000 mg | ORAL_TABLET | Freq: Every day | ORAL | Status: DC

## 2015-06-01 MED ORDER — NITROGLYCERIN 0.2 MG/ML ON CALL CATH LAB
INTRAVENOUS | Status: AC
  Filled 2015-06-01: qty 1

## 2015-06-01 MED ORDER — HEPARIN BOLUS VIA INFUSION
2000.0000 [IU] | Freq: Once | INTRAVENOUS | Status: AC
  Administered 2015-06-01: 2000 [IU] via INTRAVENOUS
  Filled 2015-06-01: qty 2000

## 2015-06-01 MED ORDER — POTASSIUM CHLORIDE CRYS ER 20 MEQ PO TBCR
40.0000 meq | EXTENDED_RELEASE_TABLET | Freq: Two times a day (BID) | ORAL | Status: DC
Start: 2015-06-01 — End: 2015-06-01

## 2015-06-01 MED ORDER — LEVOTHYROXINE SODIUM 150 MCG PO TABS
150.0000 ug | ORAL_TABLET | Freq: Every day | ORAL | Status: DC
  Administered 2015-06-02 – 2015-06-08 (×7): 150 ug via ORAL
  Filled 2015-06-01: qty 1
  Filled 2015-06-01: qty 2
  Filled 2015-06-01 (×4): qty 1
  Filled 2015-06-01 (×2): qty 2

## 2015-06-01 MED ORDER — HEPARIN (PORCINE) IN NACL 100-0.45 UNIT/ML-% IJ SOLN
1150.0000 [IU]/h | INTRAMUSCULAR | Status: DC
  Administered 2015-06-01: 1000 [IU]/h via INTRAVENOUS
  Administered 2015-06-03: 1150 [IU]/h via INTRAVENOUS
  Filled 2015-06-01 (×4): qty 250

## 2015-06-01 MED ORDER — HEPARIN SODIUM (PORCINE) 1000 UNIT/ML IJ SOLN
INTRAMUSCULAR | Status: DC | PRN
  Administered 2015-06-01: 3500 [IU] via INTRAVENOUS

## 2015-06-01 MED ORDER — ASPIRIN 81 MG PO CHEW
81.0000 mg | CHEWABLE_TABLET | Freq: Every day | ORAL | Status: DC
  Administered 2015-06-02 – 2015-06-03 (×2): 81 mg via ORAL
  Filled 2015-06-01 (×2): qty 1

## 2015-06-01 MED ORDER — SODIUM CHLORIDE 0.9 % WEIGHT BASED INFUSION: 3.0000 mL/kg/h | INTRAVENOUS | Status: AC

## 2015-06-01 MED ORDER — IOHEXOL 350 MG/ML SOLN
INTRAVENOUS | Status: DC | PRN
  Administered 2015-06-01: 80 mL via INTRAVENOUS

## 2015-06-01 MED ORDER — POTASSIUM CHLORIDE CRYS ER 20 MEQ PO TBCR
40.0000 meq | EXTENDED_RELEASE_TABLET | Freq: Four times a day (QID) | ORAL | Status: AC
  Administered 2015-06-01 (×2): 40 meq via ORAL
  Filled 2015-06-01 (×2): qty 2

## 2015-06-01 MED ORDER — MIDAZOLAM HCL 2 MG/2ML IJ SOLN
INTRAMUSCULAR | Status: AC
  Filled 2015-06-01: qty 2

## 2015-06-01 MED ORDER — FENTANYL CITRATE (PF) 100 MCG/2ML IJ SOLN
INTRAMUSCULAR | Status: DC | PRN
  Administered 2015-06-01: 25 ug via INTRAVENOUS

## 2015-06-01 MED ORDER — SODIUM CHLORIDE 0.9 % IJ SOLN
3.0000 mL | Freq: Two times a day (BID) | INTRAMUSCULAR | Status: DC
  Administered 2015-06-01 – 2015-06-03 (×4): 3 mL via INTRAVENOUS

## 2015-06-01 MED ORDER — VERAPAMIL HCL 2.5 MG/ML IV SOLN
INTRAVENOUS | Status: AC
  Filled 2015-06-01: qty 2

## 2015-06-01 MED ORDER — SODIUM CHLORIDE 0.9 % IJ SOLN: 3.0000 mL | INTRAMUSCULAR | Status: DC | PRN

## 2015-06-01 MED ORDER — HEPARIN (PORCINE) IN NACL 2-0.9 UNIT/ML-% IJ SOLN
INTRAMUSCULAR | Status: AC
  Filled 2015-06-01: qty 1000

## 2015-06-01 MED ORDER — HEPARIN SODIUM (PORCINE) 1000 UNIT/ML IJ SOLN
INTRAMUSCULAR | Status: AC
  Filled 2015-06-01: qty 1

## 2015-06-01 MED ORDER — FENTANYL CITRATE (PF) 100 MCG/2ML IJ SOLN
INTRAMUSCULAR | Status: AC
  Filled 2015-06-01: qty 2

## 2015-06-01 MED ORDER — SODIUM CHLORIDE 0.9 % IV SOLN
250.0000 mL | INTRAVENOUS | Status: DC | PRN
  Administered 2015-06-01: 250 mL via INTRAVENOUS

## 2015-06-01 MED ORDER — ACETAMINOPHEN 325 MG PO TABS
650.0000 mg | ORAL_TABLET | Freq: Four times a day (QID) | ORAL | Status: DC
  Administered 2015-06-01 – 2015-06-03 (×11): 650 mg via ORAL
  Filled 2015-06-01 (×11): qty 2

## 2015-06-01 MED ORDER — ONDANSETRON HCL 4 MG/2ML IJ SOLN: 4.0000 mg | Freq: Four times a day (QID) | INTRAMUSCULAR | Status: DC | PRN

## 2015-06-01 MED ORDER — LIDOCAINE HCL (PF) 1 % IJ SOLN
INTRAMUSCULAR | Status: AC
  Filled 2015-06-01: qty 30

## 2015-06-01 MED ORDER — RADIAL COCKTAIL (HEPARIN/VERAPAMIL/LIDOCAINE/NITRO)
Status: DC | PRN
  Administered 2015-06-01: 1 via INTRA_ARTERIAL

## 2015-06-01 MED ORDER — NITROGLYCERIN 1 MG/10 ML FOR IR/CATH LAB
INTRA_ARTERIAL | Status: DC | PRN
  Administered 2015-06-01: 17:00:00

## 2015-06-01 MED ORDER — MORPHINE SULFATE 2 MG/ML IJ SOLN: 2.0000 mg | INTRAMUSCULAR | Status: DC | PRN

## 2015-06-01 MED ORDER — MIDAZOLAM HCL 2 MG/2ML IJ SOLN
INTRAMUSCULAR | Status: DC | PRN
  Administered 2015-06-01: 1 mg via INTRAVENOUS

## 2015-06-01 MED ORDER — ACETAMINOPHEN 325 MG PO TABS: 650.0000 mg | ORAL_TABLET | ORAL | Status: DC | PRN

## 2015-06-01 SURGICAL SUPPLY — 11 items
CATH INFINITI 5FR ANG PIGTAIL (CATHETERS) ×2 IMPLANT
CATH OPTITORQUE TIG 4.0 5F (CATHETERS) ×2 IMPLANT
DEVICE RAD COMP TR BAND LRG (VASCULAR PRODUCTS) ×2 IMPLANT
GLIDESHEATH SLEND A-KIT 6F 22G (SHEATH) ×2 IMPLANT
KIT HEART LEFT (KITS) ×2 IMPLANT
PACK CARDIAC CATHETERIZATION (CUSTOM PROCEDURE TRAY) ×2 IMPLANT
SYR MEDRAD MARK V 150ML (SYRINGE) ×2 IMPLANT
TRANSDUCER W/STOPCOCK (MISCELLANEOUS) ×2 IMPLANT
TUBING CIL FLEX 10 FLL-RA (TUBING) ×2 IMPLANT
WIRE HI TORQ VERSACORE-J 145CM (WIRE) ×2 IMPLANT
WIRE SAFE-T 1.5MM-J .035X260CM (WIRE) ×2 IMPLANT

## 2015-06-01 NOTE — H&P (View-Only) (Signed)
  SUBJECTIVE:  The patient was admitted with a non-STEMI. She has not had any recurring pain since admission. There is no history of syncope or presyncope in the past. Her troponin was elevated and is already beginning to come down. By standard protocol she was placed on a very small dose of beta blockade. This morning she had a brief episode of marked bradycardia. She had 4 junctional escape beats. She did not have significant symptoms. I have stopped her low-dose beta blocker.   Filed Vitals:   06/01/15 0000 06/01/15 0400 06/01/15 0755 06/01/15 0926  BP: 106/57 109/55 116/63 116/51  Pulse: 74 83 78 74  Temp: 98.4 F (36.9 C) 98.4 F (36.9 C) 98.5 F (36.9 C) 98.2 F (36.8 C)  TempSrc: Oral Oral Oral Oral  Resp: 18 18 18   Height:      Weight:  163 lb 4.8 oz (74.072 kg)    SpO2: 93% 96% 99% 96%     Intake/Output Summary (Last 24 hours) at 06/01/15 1134 Last data filed at 05/31/15 2209  Gross per 24 hour  Intake  80.58 ml  Output      0 ml  Net  80.58 ml    LABS: Basic Metabolic Panel:  Recent Labs  05/31/15 1700 05/31/15 2129 06/01/15 0400  NA 139  --  138  K 3.4*  --  3.3*  CL 97*  --  99*  CO2 29  --  28  GLUCOSE 111*  --  111*  BUN 27*  --  24*  CREATININE 1.21*  --  1.01*  CALCIUM 10.5*  --  9.6  MG  --  1.8  --    Liver Function Tests: No results for input(s): AST, ALT, ALKPHOS, BILITOT, PROT, ALBUMIN in the last 72 hours. No results for input(s): LIPASE, AMYLASE in the last 72 hours. CBC:  Recent Labs  05/31/15 1700 06/01/15 0400  WBC 8.8 7.7  HGB 12.3 10.7*  HCT 37.4 32.7*  MCV 87.4 89.1  PLT 286 220   Cardiac Enzymes:  Recent Labs  05/31/15 2129 06/01/15 0400 06/01/15 0854  TROPONINI 3.47* 2.26* 1.67*   BNP: Invalid input(s): POCBNP D-Dimer: No results for input(s): DDIMER in the last 72 hours. Hemoglobin A1C: No results for input(s): HGBA1C in the last 72 hours. Fasting Lipid Panel:  Recent Labs  06/01/15 0400  CHOL 149    HDL 34*  LDLCALC 90  TRIG 126  CHOLHDL 4.4   Thyroid Function Tests:  Recent Labs  05/31/15 2129  TSH 0.101*    RADIOLOGY: Dg Chest 2 View  05/31/2015   CLINICAL DATA:  Severe are current and upper chest pain for 3 weeks.  EXAM: CHEST  2 VIEW  COMPARISON:  6/2 8/60  FINDINGS: Heart size is normal. No pleural effusion identified. No interstitial edema or airspace consolidation. Chronic bronchitic changes are noted bilaterally.  IMPRESSION: 1. Bronchitic changes.   Electronically Signed   By: Taylor  Stroud M.D.   On: 05/31/2015 17:02    PHYSICAL EXAM   patient is oriented to person time and place. Affect is normal she having a 2-D echo at this time. Lungs are clear. Respiratory effort is nonlabored. Cardiac exam reveals S1 and S2.   TELEMETRY:  I have personally reviewed telemetry today June 01, 2015. There is sinus rhythm. As outlined therapy is a period of bradycardia with 4 consecutive junctional escape beats.   ASSESSMENT AND PLAN:    NSTEMI (non-ST elevated myocardial infarction)      Troponin is coming down. The patient is for catheterization later this afternoon.    Hypothyroidism     Her TSH is low at 0.1. T4 is high. She is on 200 g of thyroid replacement daily. It would appear that she should be on a lower dose. I will lower her dose to 150 g. This will need careful follow-up as an outpatient.    Bradycardia    Clinically she has not had difficulty with bradycardia in the past. It is of interest that she is bradycardic while her thyroid is being overtreated. I have stopped the very low dose of her beta blocker. Based on her coronary anatomy and her clinical course, plans can be made about the follow-up of her bradycardia. As outlined, she had 4 consecutive junctional escape beats on the monitor.   Willa Rough 06/01/2015 11:34 AM

## 2015-06-01 NOTE — Progress Notes (Signed)
ANTICOAGULATION CONSULT NOTE - Follow-up Consult  Pharmacy Consult for Heparin Indication: chest pain/ACS  No Known Allergies  Patient Measurements: Height: 4\' 11"  (149.9 cm) Weight: 163 lb 14.4 oz (74.345 kg) IBW/kg (Calculated) : 43.2  Heparin Dosing Weight: 62 kg  Vital Signs: Temp: 98.4 F (36.9 C) (07/29 0000) Temp Source: Oral (07/29 0000) BP: 106/57 mmHg (07/29 0000) Pulse Rate: 74 (07/29 0000)  Labs:  Recent Labs  05/31/15 1700 05/31/15 1735 05/31/15 2129 06/01/15 0400  HGB 12.3  --   --  10.7*  HCT 37.4  --   --  32.7*  PLT 286  --   --  220  APTT  --  32  --   --   LABPROT  --  13.7 14.2  --   INR  --  1.03 1.08  --   HEPARINUNFRC  --   --   --  0.16*  CREATININE 1.21*  --   --   --   TROPONINI  --   --  3.47*  --     Estimated Creatinine Clearance: 39.1 mL/min (by C-G formula based on Cr of 1.21).  Assessment: 69 YOF on heparin for NSTEMI. Heparin level 0.16 (subtherapeutic) on 750 units/hr. Hgb down to 10.7, plt ok. No issues with line or bleeding noted per RN.  Goal of Therapy:  Heparin level 0.3-0.7 units/ml Monitor platelets by anticoagulation protocol: Yes   Plan:  1. Rebolus heparin 2000 unit  2. Increase heparin gtt to 950 units/hr 3. F/u 6 hr heparin level  06/03/15, PharmD, BCPS Clinical pharmacist, pager [phone] 06/01/2015 4:57 AM

## 2015-06-01 NOTE — Progress Notes (Signed)
Utilization review completed. Dalayna Lauter, RN, BSN. 

## 2015-06-01 NOTE — Progress Notes (Signed)
ANTICOAGULATION CONSULT NOTE - Follow-up Consult  Pharmacy Consult for Heparin Indication: chest pain/ACS  No Known Allergies  Patient Measurements: Height: 4\' 11"  (149.9 cm) Weight: 163 lb 4.8 oz (74.072 kg) IBW/kg (Calculated) : 43.2  Heparin Dosing Weight: 62 kg  Vital Signs: Temp: 98.3 F (36.8 C) (07/29 1142) Temp Source: Oral (07/29 1142) BP: 112/52 mmHg (07/29 1142) Pulse Rate: 81 (07/29 1142)  Labs:  Recent Labs  05/31/15 1700 05/31/15 1735 05/31/15 2129 06/01/15 0400 06/01/15 0854 06/01/15 1116  HGB 12.3  --   --  10.7*  --   --   HCT 37.4  --   --  32.7*  --   --   PLT 286  --   --  220  --   --   APTT  --  32  --   --   --   --   LABPROT  --  13.7 14.2  --   --   --   INR  --  1.03 1.08  --   --   --   HEPARINUNFRC  --   --   --  0.16*  --  0.36  CREATININE 1.21*  --   --  1.01*  --   --   TROPONINI  --   --  3.47* 2.26* 1.67*  --     Estimated Creatinine Clearance: 46.8 mL/min (by C-G formula based on Cr of 1.01).  Assessment: 69 YOF on heparin for NSTEMI. Heparin level is therapeutic x1 on 950 units/hr. Hgb down to 10.7, plt ok. No issues with line or bleeding noted per RN.  Goal of Therapy:  Heparin level 0.3-0.7 units/ml Monitor platelets by anticoagulation protocol: Yes   Plan:  1. Continue heparin gtt at 950 units/hr 2. Daily HL and CBC 3. F/u cardiology plans    06/03/15, PharmD, BCPS Clinical Pharmacist Pager: [phone] 06/01/2015 12:43 PM

## 2015-06-01 NOTE — Progress Notes (Signed)
ANTICOAGULATION CONSULT NOTE - Follow-up Consult  Pharmacy Consult for Heparin Indication: chest pain/ACS  No Known Allergies  Labs:  Recent Labs  05/31/15 1700 05/31/15 1735 05/31/15 2129 06/01/15 0400 06/01/15 0854 06/01/15 1116  HGB 12.3  --   --  10.7*  --   --   HCT 37.4  --   --  32.7*  --   --   PLT 286  --   --  220  --   --   APTT  --  32  --   --   --   --   LABPROT  --  13.7 14.2  --   --   --   INR  --  1.03 1.08  --   --   --   HEPARINUNFRC  --   --   --  0.16*  --  0.36  CREATININE 1.21*  --   --  1.01*  --   --   TROPONINI  --   --  3.47* 2.26* 1.67*  --     Estimated Creatinine Clearance: 46.8 mL/min (by C-G formula based on Cr of 1.01).  Assessment: 69 YOF on heparin for NSTEMI.  Now s/p cath, heparin to resume after TR band removal CVTS consult pending.  Goal of Therapy:  Heparin level 0.3-0.7 units/ml Monitor platelets by anticoagulation protocol: Yes   Plan:  Heparin to restart at 1000 units/ hr after TR band removal Follow up AM heparin level, CBC  Thank you Okey Regal, PharmD [phone]  06/01/2015 5:01 PM

## 2015-06-01 NOTE — Progress Notes (Signed)
SUBJECTIVE:  The patient was admitted with a non-STEMI. She has not had any recurring pain since admission. There is no history of syncope or presyncope in the past. Her troponin was elevated and is already beginning to come down. By standard protocol she was placed on a very small dose of beta blockade. This morning she had a brief episode of marked bradycardia. She had 4 junctional escape beats. She did not have significant symptoms. I have stopped her low-dose beta blocker.   Filed Vitals:   06/01/15 0000 06/01/15 0400 06/01/15 0755 06/01/15 0926  BP: 106/57 109/55 116/63 116/51  Pulse: 74 83 78 74  Temp: 98.4 F (36.9 C) 98.4 F (36.9 C) 98.5 F (36.9 C) 98.2 F (36.8 C)  TempSrc: Oral Oral Oral Oral  Resp: 18 18 18    Height:      Weight:  163 lb 4.8 oz (74.072 kg)    SpO2: 93% 96% 99% 96%     Intake/Output Summary (Last 24 hours) at 06/01/15 1134 Last data filed at 05/31/15 2209  Gross per 24 hour  Intake  80.58 ml  Output      0 ml  Net  80.58 ml    LABS: Basic Metabolic Panel:  Recent Labs  2210 1700 05/31/15 2129 06/01/15 0400  NA 139  --  138  K 3.4*  --  3.3*  CL 97*  --  99*  CO2 29  --  28  GLUCOSE 111*  --  111*  BUN 27*  --  24*  CREATININE 1.21*  --  1.01*  CALCIUM 10.5*  --  9.6  MG  --  1.8  --    Liver Function Tests: No results for input(s): AST, ALT, ALKPHOS, BILITOT, PROT, ALBUMIN in the last 72 hours. No results for input(s): LIPASE, AMYLASE in the last 72 hours. CBC:  Recent Labs  05/31/15 1700 06/01/15 0400  WBC 8.8 7.7  HGB 12.3 10.7*  HCT 37.4 32.7*  MCV 87.4 89.1  PLT 286 220   Cardiac Enzymes:  Recent Labs  05/31/15 2129 06/01/15 0400 06/01/15 0854  TROPONINI 3.47* 2.26* 1.67*   BNP: Invalid input(s): POCBNP D-Dimer: No results for input(s): DDIMER in the last 72 hours. Hemoglobin A1C: No results for input(s): HGBA1C in the last 72 hours. Fasting Lipid Panel:  Recent Labs  06/01/15 0400  CHOL 149    HDL 34*  LDLCALC 90  TRIG 06/03/15  CHOLHDL 4.4   Thyroid Function Tests:  Recent Labs  05/31/15 2129  TSH 0.101*    RADIOLOGY: Dg Chest 2 View  05/31/2015   CLINICAL DATA:  Severe are current and upper chest pain for 3 weeks.  EXAM: CHEST  2 VIEW  COMPARISON:  6/2 8/60  FINDINGS: Heart size is normal. No pleural effusion identified. No interstitial edema or airspace consolidation. Chronic bronchitic changes are noted bilaterally.  IMPRESSION: 1. Bronchitic changes.   Electronically Signed   By: 9/60 M.D.   On: 05/31/2015 17:02    PHYSICAL EXAM   patient is oriented to person time and place. Affect is normal she having a 2-D echo at this time. Lungs are clear. Respiratory effort is nonlabored. Cardiac exam reveals S1 and S2.   TELEMETRY:  I have personally reviewed telemetry today June 01, 2015. There is sinus rhythm. As outlined therapy is a period of bradycardia with 4 consecutive junctional escape beats.   ASSESSMENT AND PLAN:    NSTEMI (non-ST elevated myocardial infarction)  Troponin is coming down. The patient is for catheterization later this afternoon.    Hypothyroidism     Her TSH is low at 0.1. T4 is high. She is on 200 g of thyroid replacement daily. It would appear that she should be on a lower dose. I will lower her dose to 150 g. This will need careful follow-up as an outpatient.    Bradycardia    Clinically she has not had difficulty with bradycardia in the past. It is of interest that she is bradycardic while her thyroid is being overtreated. I have stopped the very low dose of her beta blocker. Based on her coronary anatomy and her clinical course, plans can be made about the follow-up of her bradycardia. As outlined, she had 4 consecutive junctional escape beats on the monitor.   [name] 06/01/2015 11:34 AM

## 2015-06-01 NOTE — Progress Notes (Signed)
  Echocardiogram 2D Echocardiogram has been performed.  Janalyn Harder 06/01/2015, 12:14 PM

## 2015-06-01 NOTE — Care Management Note (Signed)
Case Management Note  Patient Details  Name: Amber AINLEY MRN: [id number] Date of Birth: 1946-02-07  Subjective/Objective:   Pt admitted for cp.                  Action/Plan: CM to monitor for disposition needs.    Expected Discharge Date:                  Expected Discharge Plan:  Home/Self Care  In-House Referral:     Discharge planning Services  CM Consult  Post Acute Care Choice:    Choice offered to:     DME Arranged:    DME Agency:     HH Arranged:    HH Agency:     Status of Service:  In process, will continue to follow  Medicare Important Message Given:    Date Medicare IM Given:    Medicare IM give by:    Date Additional Medicare IM Given:    Additional Medicare Important Message give by:     If discussed at Long Length of Stay Meetings, dates discussed:    Additional Comments:  Gala Lewandowsky, RN 06/01/2015, 3:16 PM

## 2015-06-01 NOTE — Interval H&P Note (Signed)
Cath Lab Visit (complete for each Cath Lab visit)  Clinical Evaluation Leading to the Procedure:   ACS: Yes.    Non-ACS:    Anginal Classification: CCS IV  Anti-ischemic medical therapy: No Therapy  Non-Invasive Test Results: No non-invasive testing performed  Prior CABG: No previous CABG      History and Physical Interval Note:  06/01/2015 4:11 PM  [name]  has presented today for surgery, with the diagnosis of NSTEMI  The various methods of treatment have been discussed with the patient and family. After consideration of risks, benefits and other options for treatment, the patient has consented to  Procedure(s): Left Heart Cath and Coronary Angiography (N/A) as a surgical intervention .  The patient's history has been reviewed, patient examined, no change in status, stable for surgery.  I have reviewed the patient's chart and labs.  Questions were answered to the patient's satisfaction.     Nanetta Batty

## 2015-06-02 DIAGNOSIS — R001 Bradycardia, unspecified: Secondary | ICD-10-CM

## 2015-06-02 DIAGNOSIS — I25110 Atherosclerotic heart disease of native coronary artery with unstable angina pectoris: Secondary | ICD-10-CM

## 2015-06-02 LAB — CBC
HCT: 31.5 % — ABNORMAL LOW (ref 36.0–46.0)
Hemoglobin: 9.9 g/dL — ABNORMAL LOW (ref 12.0–15.0)
MCH: 28.4 pg (ref 26.0–34.0)
MCHC: 31.4 g/dL (ref 30.0–36.0)
MCV: 90.5 fL (ref 78.0–100.0)
Platelets: 189 10*3/uL (ref 150–400)
RBC: 3.48 MIL/uL — ABNORMAL LOW (ref 3.87–5.11)
RDW: 15 % (ref 11.5–15.5)
WBC: 6.5 10*3/uL (ref 4.0–10.5)

## 2015-06-02 LAB — BASIC METABOLIC PANEL
Anion gap: 6 (ref 5–15)
BUN: 14 mg/dL (ref 6–20)
CO2: 25 mmol/L (ref 22–32)
Calcium: 9.4 mg/dL (ref 8.9–10.3)
Chloride: 108 mmol/L (ref 101–111)
Creatinine, Ser: 0.95 mg/dL (ref 0.44–1.00)
GFR calc Af Amer: >60 mL/min (ref >60)
GFR calc non Af Amer: >60 mL/min (ref >60)
Glucose, Bld: 111 mg/dL — ABNORMAL HIGH (ref 65–99)
Potassium: 4.2 mmol/L (ref 3.5–5.1)
Sodium: 139 mmol/L (ref 135–145)

## 2015-06-02 LAB — HEPARIN LEVEL (UNFRACTIONATED)
Heparin Unfractionated: 0.25 IU/mL — ABNORMAL LOW (ref 0.30–0.70)
Heparin Unfractionated: 0.5 IU/mL (ref 0.30–0.70)

## 2015-06-02 LAB — HEMOGLOBIN A1C
Hgb A1c MFr Bld: 5.8 % — ABNORMAL HIGH (ref 4.8–5.6)
Mean Plasma Glucose: 120 mg/dL

## 2015-06-02 MED ORDER — HEPARIN BOLUS VIA INFUSION
900.0000 [IU] | Freq: Once | INTRAVENOUS | Status: AC
  Administered 2015-06-02: 900 [IU] via INTRAVENOUS
  Filled 2015-06-02: qty 900

## 2015-06-02 NOTE — Consult Note (Signed)
Reason for Consult:3 vessel CAD Referring Physician: Dr. Thomasene Huff is an 69 y.o. female.  HPI: 69 yo woman wo resented with a cc/o chest pain.  [name] is 69 yo woman with a history of tobacco abuse, COPD, pneumonia, ARDS requiring trach (200^), morbid obesity, hypertension, hyperlipidemia, GERD with Barrett's, post inflammatory pulmonary fibrosis, psoriatic and rheumatoid arthritis. She had no prior history of CAD.   She began having a burning sensation in her chest about 3 weeks prior to admission. It would start near her throat and then move down into her chest. She has a history of GERD and thought she was having reflux. She doubled her PPI but it did not help. Last Saturday she was awakened from her sleep by the pain. This was a more severe pain which she describes as a "heaviness." She felt short of breath, she had nausea and diaphoresis. It lasted about 2 hours altogether. On the day of admission, 7/28, she had 2 very severe epsiodes and was taken to the ED by her daughter. Her initial troponin was 1.28. She was given NTG in the ED with good relief. Her ECG did not show ST elevation.  Her troponin eventually rose to 3.47 and is now trending down. Yesterday she had a cardiac catheterization which revealed severe 3 vessel CAD.   She had been on home o2 previously but has been able to wean to PRN since losing 57 pounds this year. She does use it at night. She is followed by Dr. Melvyn Novas for her pulmonary issues.  Pt's husband is disabled and was hospitalized the same evening she was due to a TIA. He will need to go to rehab post discharge.  -  Past Medical History  Diagnosis Date  . Psoriatic arthritis   . Rheumatic disease   . Hypertension   . Hypothyroidism   . Barrett esophagus   . COPD (chronic obstructive pulmonary disease)   . Chronic respiratory failure   . Pulmonary fibrosis   . GERD (gastroesophageal reflux disease)   . Shortness of breath     WITH EXERTION   . History of home oxygen therapy     AT NIGHT WHEN SLEEPING 2 L / MIN NASAL CANNULA  . History of ARDS 2006  . Pneumococcal pneumonia 2006    HOSPITALIZED AND DEVELOPED ARDS  . Arthritis     OA RIGHT KNEE WITH PAIN  . NSTEMI (non-ST elevated myocardial infarction) 05/31/2015    Past Surgical History  Procedure Laterality Date  . Carpal tunnel release    . Cosmetic breast surgery    . Abdominoplasty    . Cholecystectomy    . Left knee arthroscopic    . Left total knee replacement    . Joint replacement    . Total knee arthroplasty Right 06/06/2013    Procedure: RIGHT TOTAL KNEE ARTHROPLASTY;  Surgeon: Gearlean Alf, MD;  Location: [facility];  Service: Orthopedics;  Laterality: Right;    Family History  Problem Relation Age of Onset  . Coronary artery disease Father   . Breast cancer Mother   . Rheum arthritis Father     Social History:  reports that she quit smoking about 10 years ago. Her smoking use included Cigarettes. She has a 87.75 pack-year smoking history. She has never used smokeless tobacco. She reports that she does not drink alcohol or use illicit drugs.  Allergies: No Known Allergies  Medications:  Prior to Admission:  Prescriptions prior to admission  Medication Sig Dispense Refill Last Dose  . acetaminophen (TYLENOL ARTHRITIS PAIN) 650 MG CR tablet Take 1,300 mg by mouth every 8 (eight) hours as needed for pain. Per bottle directions as needed   05/31/2015 at 1300  . amLODipine (NORVASC) 10 MG tablet Take 1 tablet by mouth  every morning 90 tablet 1 05/31/2015 at Unknown time  . budesonide-formoterol (SYMBICORT) 160-4.5 MCG/ACT inhaler Inhale 2 puffs into the lungs 2 (two) times daily. 3 Inhaler 3 05/31/2015 at Unknown time  . citalopram (CELEXA) 40 MG tablet Take 1 tablet by mouth  every morning (Patient taking differently: Take 40 mg by mouth daily. ) 90 tablet 1 05/31/2015 at Unknown time  . levothyroxine (SYNTHROID, LEVOTHROID) 200 MCG tablet Take 1 tablet by  mouth  every day before breakfast (Patient taking differently: Take 200 mcg by mouth daily before breakfast. ) 90 tablet 1 05/31/2015 at Unknown time  . losartan-hydrochlorothiazide (HYZAAR) 100-25 MG per tablet Take 1 tablet by mouth  every morning 90 tablet 1 05/31/2015 at Unknown time  . pantoprazole (PROTONIX) 40 MG tablet Take 1 tablet by mouth  daily (Patient taking differently: Take 40 mg by mouth 2 (two) times daily. ) 90 tablet 1 05/31/2015 at Unknown time  . phentermine (ADIPEX-P) 37.5 MG tablet Take 37.5 mg by mouth daily.  0 05/31/2015 at Unknown time  . traMADol (ULTRAM) 50 MG tablet Take one tablet in the am, and 2 tablets at lunch, and 1 tablet with supper 360 tablet 1 05/31/2015 at Unknown time  . triamcinolone cream (KENALOG) 0.1 % Apply 1 application topically 2 (two) times daily as needed (for psorisis).   05/31/2015 at Unknown time  . UNABLE TO FIND Med Name: Cimzia per Dr. Amil Amen   Taking  . omeprazole (PRILOSEC) 20 MG capsule Take 1 capsule (20 mg total) by mouth every morning. (Patient not taking: Reported on 05/31/2015) 90 capsule 1 Not Taking at Unknown time    Results for orders placed or performed during the hospital encounter of 05/31/15 (from the past 48 hour(s))  Basic metabolic panel     Status: Abnormal   Collection Time: 05/31/15  5:00 PM  Result Value Ref Range   Sodium 139 135 - 145 mmol/L   Potassium 3.4 (L) 3.5 - 5.1 mmol/L   Chloride 97 (L) 101 - 111 mmol/L   CO2 29 22 - 32 mmol/L   Glucose, Bld 111 (H) 65 - 99 mg/dL   BUN 27 (H) 6 - 20 mg/dL   Creatinine, Ser 1.21 (H) 0.44 - 1.00 mg/dL   Calcium 10.5 (H) 8.9 - 10.3 mg/dL   GFR calc non Af Amer 45 (L) >60 mL/min   GFR calc Af Amer 52 (L) >60 mL/min    Comment: (NOTE) The eGFR has been calculated using the CKD EPI equation. This calculation has not been validated in all clinical situations. eGFR's persistently <60 mL/min signify possible Chronic Kidney Disease.    Anion gap 13 5 - 15  CBC     Status: None    Collection Time: 05/31/15  5:00 PM  Result Value Ref Range   WBC 8.8 4.0 - 10.5 K/uL   RBC 4.28 3.87 - 5.11 MIL/uL   Hemoglobin 12.3 12.0 - 15.0 g/dL   HCT 37.4 36.0 - 46.0 %   MCV 87.4 78.0 - 100.0 fL   MCH 28.7 26.0 - 34.0 pg   MCHC 32.9 30.0 - 36.0 g/dL   RDW 14.3 11.5 - 15.5 %   Platelets 286  150 - 400 K/uL  I-stat troponin, ED (not at [facility], [facility])     Status: Abnormal   Collection Time: 05/31/15  5:06 PM  Result Value Ref Range   Troponin i, poc 1.28 (HH) 0.00 - 0.08 ng/mL   Comment NOTIFIED PHYSICIAN    Comment 3            Comment: Due to the release kinetics of cTnI, a negative result within the first hours of the onset of symptoms does not rule out myocardial infarction with certainty. If myocardial infarction is still suspected, repeat the test at appropriate intervals.   APTT     Status: None   Collection Time: 05/31/15  5:35 PM  Result Value Ref Range   aPTT 32 24 - 37 seconds  Protime-INR     Status: None   Collection Time: 05/31/15  5:35 PM  Result Value Ref Range   Prothrombin Time 13.7 11.6 - 15.2 seconds   INR 1.03 0.00 - 1.49  MRSA PCR Screening     Status: None   Collection Time: 05/31/15  9:19 PM  Result Value Ref Range   MRSA by PCR NEGATIVE NEGATIVE    Comment:        The GeneXpert MRSA Assay (FDA approved for NASAL specimens only), is one component of a comprehensive MRSA colonization surveillance program. It is not intended to diagnose MRSA infection nor to guide or monitor treatment for MRSA infections.   Magnesium     Status: None   Collection Time: 05/31/15  9:29 PM  Result Value Ref Range   Magnesium 1.8 1.7 - 2.4 mg/dL  TSH     Status: Abnormal   Collection Time: 05/31/15  9:29 PM  Result Value Ref Range   TSH 0.101 (L) 0.350 - 4.500 uIU/mL  T4, free     Status: Abnormal   Collection Time: 05/31/15  9:29 PM  Result Value Ref Range   Free T4 2.16 (H) 0.61 - 1.12 ng/dL  Troponin I     Status: Abnormal   Collection Time: 05/31/15   9:29 PM  Result Value Ref Range   Troponin I 3.47 (HH) <0.031 ng/mL    Comment:        POSSIBLE MYOCARDIAL ISCHEMIA. SERIAL TESTING RECOMMENDED. REPEATED TO VERIFY CRITICAL RESULT CALLED TO, READ BACK BY AND VERIFIED WITH: [name] [phone] [id number]   Hemoglobin A1c     Status: Abnormal   Collection Time: 05/31/15  9:29 PM  Result Value Ref Range   Hgb A1c MFr Bld 5.8 (H) 4.8 - 5.6 %    Comment: (NOTE)         Pre-diabetes: 5.7 - 6.4         Diabetes: >6.4         Glycemic control for adults with diabetes: <7.0    Mean Plasma Glucose 120 mg/dL    Comment: (NOTE) Performed At: [facility] [place], [place] [id number] Lindon Romp MD [id number]   Protime-INR     Status: None   Collection Time: 05/31/15  9:29 PM  Result Value Ref Range   Prothrombin Time 14.2 11.6 - 15.2 seconds   INR 1.08 0.00 - 1.49  Troponin I     Status: Abnormal   Collection Time: 06/01/15  4:00 AM  Result Value Ref Range   Troponin I 2.26 (HH) <0.031 ng/mL    Comment:        POSSIBLE MYOCARDIAL ISCHEMIA. SERIAL TESTING RECOMMENDED. CRITICAL VALUE NOTED.  VALUE IS CONSISTENT WITH PREVIOUSLY REPORTED AND CALLED VALUE.   Basic metabolic panel     Status: Abnormal   Collection Time: 06/01/15  4:00 AM  Result Value Ref Range   Sodium 138 135 - 145 mmol/L   Potassium 3.3 (L) 3.5 - 5.1 mmol/L   Chloride 99 (L) 101 - 111 mmol/L   CO2 28 22 - 32 mmol/L   Glucose, Bld 111 (H) 65 - 99 mg/dL   BUN 24 (H) 6 - 20 mg/dL   Creatinine, Ser 1.01 (H) 0.44 - 1.00 mg/dL   Calcium 9.6 8.9 - 10.3 mg/dL   GFR calc non Af Amer 56 (L) >60 mL/min   GFR calc Af Amer >60 >60 mL/min    Comment: (NOTE) The eGFR has been calculated using the CKD EPI equation. This calculation has not been validated in all clinical situations. eGFR's persistently <60 mL/min signify possible Chronic Kidney Disease.    Anion gap 11 5 - 15  Lipid panel     Status: Abnormal   Collection Time: 06/01/15   4:00 AM  Result Value Ref Range   Cholesterol 149 0 - 200 mg/dL   Triglycerides 126 <150 mg/dL   HDL 34 (L) >40 mg/dL   Total CHOL/HDL Ratio 4.4 RATIO   VLDL 25 0 - 40 mg/dL   LDL Cholesterol 90 0 - 99 mg/dL    Comment:        Total Cholesterol/HDL:CHD Risk Coronary Heart Disease Risk Table                     Men   Women  1/2 Average Risk   3.4   3.3  Average Risk       5.0   4.4  2 X Average Risk   9.6   7.1  3 X Average Risk  23.4   11.0        Use the calculated Patient Ratio above and the CHD Risk Table to determine the patient's CHD Risk.        ATP III CLASSIFICATION (LDL):  <100     mg/dL   Optimal  100-129  mg/dL   Near or Above                    Optimal  130-159  mg/dL   Borderline  160-189  mg/dL   High  >190     mg/dL   Very High   CBC     Status: Abnormal   Collection Time: 06/01/15  4:00 AM  Result Value Ref Range   WBC 7.7 4.0 - 10.5 K/uL   RBC 3.67 (L) 3.87 - 5.11 MIL/uL   Hemoglobin 10.7 (L) 12.0 - 15.0 g/dL   HCT 32.7 (L) 36.0 - 46.0 %   MCV 89.1 78.0 - 100.0 fL   MCH 29.2 26.0 - 34.0 pg   MCHC 32.7 30.0 - 36.0 g/dL   RDW 14.4 11.5 - 15.5 %   Platelets 220 150 - 400 K/uL  Heparin level (unfractionated)     Status: Abnormal   Collection Time: 06/01/15  4:00 AM  Result Value Ref Range   Heparin Unfractionated 0.16 (L) 0.30 - 0.70 IU/mL    Comment:        IF HEPARIN RESULTS ARE BELOW EXPECTED VALUES, AND PATIENT DOSAGE HAS BEEN CONFIRMED, SUGGEST FOLLOW UP TESTING OF ANTITHROMBIN III LEVELS.   Troponin I     Status: Abnormal   Collection Time: 06/01/15  8:54  AM  Result Value Ref Range   Troponin I 1.67 (HH) <0.031 ng/mL    Comment:        POSSIBLE MYOCARDIAL ISCHEMIA. SERIAL TESTING RECOMMENDED. REPEATED TO VERIFY CRITICAL VALUE NOTED.  VALUE IS CONSISTENT WITH PREVIOUSLY REPORTED AND CALLED VALUE.   Heparin level (unfractionated)     Status: None   Collection Time: 06/01/15 11:16 AM  Result Value Ref Range   Heparin Unfractionated  0.36 0.30 - 0.70 IU/mL    Comment:        IF HEPARIN RESULTS ARE BELOW EXPECTED VALUES, AND PATIENT DOSAGE HAS BEEN CONFIRMED, SUGGEST FOLLOW UP TESTING OF ANTITHROMBIN III LEVELS.   Hepatic function panel     Status: Abnormal   Collection Time: 06/01/15 11:16 AM  Result Value Ref Range   Total Protein 6.4 (L) 6.5 - 8.1 g/dL   Albumin 3.3 (L) 3.5 - 5.0 g/dL   AST 26 15 - 41 U/L   ALT 16 14 - 54 U/L   Alkaline Phosphatase 46 38 - 126 U/L   Total Bilirubin 0.5 0.3 - 1.2 mg/dL   Bilirubin, Direct <0.1 (L) 0.1 - 0.5 mg/dL   Indirect Bilirubin NOT CALCULATED 0.3 - 0.9 mg/dL  CBC     Status: Abnormal   Collection Time: 06/02/15  2:54 AM  Result Value Ref Range   WBC 6.5 4.0 - 10.5 K/uL   RBC 3.48 (L) 3.87 - 5.11 MIL/uL   Hemoglobin 9.9 (L) 12.0 - 15.0 g/dL   HCT 31.5 (L) 36.0 - 46.0 %   MCV 90.5 78.0 - 100.0 fL   MCH 28.4 26.0 - 34.0 pg   MCHC 31.4 30.0 - 36.0 g/dL   RDW 15.0 11.5 - 15.5 %   Platelets 189 150 - 400 K/uL  Basic metabolic panel     Status: Abnormal   Collection Time: 06/02/15  2:54 AM  Result Value Ref Range   Sodium 139 135 - 145 mmol/L   Potassium 4.2 3.5 - 5.1 mmol/L    Comment: DELTA CHECK NOTED   Chloride 108 101 - 111 mmol/L   CO2 25 22 - 32 mmol/L   Glucose, Bld 111 (H) 65 - 99 mg/dL   BUN 14 6 - 20 mg/dL   Creatinine, Ser 0.95 0.44 - 1.00 mg/dL   Calcium 9.4 8.9 - 10.3 mg/dL   GFR calc non Af Amer >60 >60 mL/min   GFR calc Af Amer >60 >60 mL/min    Comment: (NOTE) The eGFR has been calculated using the CKD EPI equation. This calculation has not been validated in all clinical situations. eGFR's persistently <60 mL/min signify possible Chronic Kidney Disease.    Anion gap 6 5 - 15  Heparin level (unfractionated)     Status: Abnormal   Collection Time: 06/02/15  6:23 AM  Result Value Ref Range   Heparin Unfractionated 0.25 (L) 0.30 - 0.70 IU/mL    Comment:        IF HEPARIN RESULTS ARE BELOW EXPECTED VALUES, AND PATIENT DOSAGE HAS BEEN  CONFIRMED, SUGGEST FOLLOW UP TESTING OF ANTITHROMBIN III LEVELS.     Dg Chest 2 View  05/31/2015   CLINICAL DATA:  Severe are current and upper chest pain for 3 weeks.  EXAM: CHEST  2 VIEW  COMPARISON:  6/2 8/60  FINDINGS: Heart size is normal. No pleural effusion identified. No interstitial edema or airspace consolidation. Chronic bronchitic changes are noted bilaterally.  IMPRESSION: 1. Bronchitic changes.   Electronically Signed   By: Lovena Le  Clovis Riley M.D.   On: 05/31/2015 17:02    Review of Systems  Constitutional: Positive for weight loss (intentional), malaise/fatigue and diaphoresis. Negative for fever and chills.  Respiratory: Positive for cough and shortness of breath.   Cardiovascular: Positive for chest pain.  Gastrointestinal: Positive for heartburn and nausea. Negative for blood in stool and melena.  Genitourinary: Negative for dysuria and hematuria.  Musculoskeletal: Positive for back pain and joint pain.  Neurological: Negative for speech change and focal weakness.  Endo/Heme/Allergies:       Thyroid disease  Psychiatric/Behavioral: Positive for depression.  All other systems reviewed and are negative.  Blood pressure 126/72, pulse 82, temperature 98.5 F (36.9 C), temperature source Oral, resp. rate 16, height _0  (1.499 m), weight 166 lb (75.297 kg), SpO2 97 %. Physical Exam  Vitals reviewed. Constitutional: She is oriented to person, place, and time. No distress.  obese  HENT:  Head: Normocephalic and atraumatic.  Eyes: Conjunctivae and EOM are normal. No scleral icterus.  Neck: Neck supple. No thyromegaly present.  Healed tracheostomy scar  Cardiovascular: Normal rate, regular rhythm and normal heart sounds.   No murmur heard. Respiratory: Effort normal and breath sounds normal. She has no wheezes.  GI: Soft. There is no tenderness.  Musculoskeletal: She exhibits no edema.  Neurological: She is alert and oriented to person, place, and time. No cranial nerve  deficit. Coordination normal.  No focal weakness  Skin: Skin is warm and dry.  Psychiatric: She has a normal mood and affect.     CARDIAC CATH  Prox LAD to Mid LAD lesion, 75% stenosed.  Prox Cx lesion, 95% stenosed.  Prox RCA to Mid RCA lesion, 90% stenosed.  The left ventricular systolic function is normal.  I personally reviewed the cath films and concur with the findings above.  Assessment/Plan: 69 yo woman with multiple CRF presents with crescendo angina and ruled in for a NQWMI. At catheterization she was found to have severe 3 vessel CAD with preserved LV function.  CABG is indicated for survival benefit and relief of symptoms.  I discussed the general nature of the procedure, the need for general anesthesia, the use of cardiopulmonary bypass, and the incisions to be used with the patient and her family. We discussed the expected hospital stay, overall recovery and short and long term outcomes. I reviewed the indications, risks, benefits and alternatives. They understand the risks include, but are not limited to death, stroke, MI, DVT/PE, bleeding, possible need for transfusion, infections, cardiac arrhythmias, as well as other organ system dysfunction including respiratory, renal, or GI complications.   She accepts the risks and agrees to proceed.  I do not have an opening in the OR schedule until Thursday. I will try to arrange for one of my partners to do it earlier in the week but the timing is still uncertain.  Melrose Nakayama 06/02/2015, 1:07 PM

## 2015-06-02 NOTE — Progress Notes (Signed)
SUBJECTIVE:  No chest pain or epigastric pain since the admission.   Marland Kitchen acetaminophen  650 mg Oral QID  . amLODipine  10 mg Oral Daily  . antiseptic oral rinse  7 mL Mouth Rinse BID  . aspirin  81 mg Oral Daily  . atorvastatin  80 mg Oral q1800  . budesonide-formoterol  2 puff Inhalation BID  . citalopram  40 mg Oral Daily  . levothyroxine  150 mcg Oral QAC breakfast  . pantoprazole  40 mg Oral BID  . pneumococcal 23 valent vaccine  0.5 mL Intramuscular Tomorrow-1000  . sodium chloride  3 mL Intravenous Q12H  . traMADol  100 mg Oral Q lunch  . traMADol  50 mg Oral QAC breakfast  . traMADol  50 mg Oral Q supper   . heparin 1,150 Units/hr (06/02/15 0905)   Filed Vitals:   06/02/15 0425 06/02/15 0719 06/02/15 0757 06/02/15 0945  BP: 100/63 110/56 126/72   Pulse: 73  82   Temp: 97.8 F (36.6 C)  98.5 F (36.9 C)   TempSrc: Oral  Oral   Resp: 18  16   Height:      Weight: 166 lb (75.297 kg)     SpO2: 95%  96% 97%     Intake/Output Summary (Last 24 hours) at 06/02/15 1035 Last data filed at 06/02/15 0900  Gross per 24 hour  Intake 965.48 ml  Output      0 ml  Net 965.48 ml   LABS: Basic Metabolic Panel:  Recent Labs  33/82/50 2129 06/01/15 0400 06/02/15 0254  NA  --  138 139  K  --  3.3* 4.2  CL  --  99* 108  CO2  --  28 25  GLUCOSE  --  111* 111*  BUN  --  24* 14  CREATININE  --  1.01* 0.95  CALCIUM  --  9.6 9.4  MG 1.8  --   --    Liver Function Tests:  Recent Labs  06/01/15 1116  AST 26  ALT 16  ALKPHOS 46  BILITOT 0.5  PROT 6.4*  ALBUMIN 3.3*   CBC:  Recent Labs  06/01/15 0400 06/02/15 0254  WBC 7.7 6.5  HGB 10.7* 9.9*  HCT 32.7* 31.5*  MCV 89.1 90.5  PLT 220 189   Cardiac Enzymes:  Recent Labs  05/31/15 2129 06/01/15 0400 06/01/15 0854  TROPONINI 3.47* 2.26* 1.67*   Hemoglobin A1C:  Recent Labs  05/31/15 2129  HGBA1C 5.8*   Fasting Lipid Panel:  Recent Labs  06/01/15 0400  CHOL 149  HDL 34*  LDLCALC 90  TRIG  126  CHOLHDL 4.4   Thyroid Function Tests:  Recent Labs  05/31/15 2129  TSH 0.101*    RADIOLOGY: Dg Chest 2 View  05/31/2015   CLINICAL DATA:  Severe are current and upper chest pain for 3 weeks.  EXAM: CHEST  2 VIEW  COMPARISON:  6/2 8/60  FINDINGS: Heart size is normal. No pleural effusion identified. No interstitial edema or airspace consolidation. Chronic bronchitic changes are noted bilaterally.  IMPRESSION: 1. Bronchitic changes.   Electronically Signed   By: Signa Kell M.D.   On: 05/31/2015 17:02    PHYSICAL EXAM   patient is oriented to person time and place. Affect is normal she having a 2-D echo at this time. Lungs are clear. Respiratory effort is nonlabored. Cardiac exam reveals S1 and S2.  ECHO: 06/01/2015 - Left ventricle: The cavity size was normal. Wall  thickness was increased in a pattern of mild LVH. Systolic function was normal. The estimated ejection fraction was in the range of 55% to 60%. There is hypokinesis of the basalinferolateral myocardium. Doppler parameters are consistent with abnormal left ventricular relaxation (grade 1 diastolic dysfunction). - Mitral valve: Calcified annulus. Mildly thickened leaflets .  Impressions: - Hypokinesis of the basal/mild inferior lateral wall with overall preserved LV function; grade 1 diastolic dysfunction; mild LVH; trace MR.  TELEMETRY:  SR   ASSESSMENT AND PLAN:  1. NSTEMI (non-ST elevated myocardial infarction)    three-vessel disease with preserved LV function. She has a long segmental calcified mid LAD, long RCA and high-grade proximal thrombotic appearing AV groove circumflex stenosis.  Referred for CABG, she was seen briefly by Dr Dorris Fetch this am but he was called in an emergency. No date for CABG yet.  On aspirin and atorvastatin, metoprolol discontinued as she was bradycardic.  There is a inferior wall hypokinesis, we can consider decreasing the dose of amlodipine and start ACEI.    2. HTN - controlled  3. Hyperlipidemia - on atorvastatin  4. Hypothyroidism     Her TSH is low at 0.1. T4 is high. She is on 200 g of thyroid replacement daily. It would appear that she should be on a lower dose. I will lower her dose to 150 g. This will need careful follow-up as an outpatient.   Lars Masson, MD 06/02/2015 10:35 AM

## 2015-06-02 NOTE — Progress Notes (Signed)
ANTICOAGULATION CONSULT NOTE - Follow-up Consult  Pharmacy Consult for Heparin Indication: chest pain/ACS  No Known Allergies  Patient Measurements: Height: 4\' 11"  (149.9 cm) Weight: 166 lb (75.297 kg) IBW/kg (Calculated) : 43.2  Heparin Dosing Weight: 60.1 kg  Vital Signs: Temp: 98.9 F (37.2 C) (07/30 1348) Temp Source: Oral (07/30 1348) BP: 116/59 mmHg (07/30 1348) Pulse Rate: 95 (07/30 1348)  Labs:  Recent Labs  05/31/15 1700 05/31/15 1735 05/31/15 2129  06/01/15 0400 06/01/15 0854 06/01/15 1116 06/02/15 0254 06/02/15 0623 06/02/15 1503  HGB 12.3  --   --   --  10.7*  --   --  9.9*  --   --   HCT 37.4  --   --   --  32.7*  --   --  31.5*  --   --   PLT 286  --   --   --  220  --   --  189  --   --   APTT  --  32  --   --   --   --   --   --   --   --   LABPROT  --  13.7 14.2  --   --   --   --   --   --   --   INR  --  1.03 1.08  --   --   --   --   --   --   --   HEPARINUNFRC  --   --   --   < > 0.16*  --  0.36  --  0.25* 0.50  CREATININE 1.21*  --   --   --  1.01*  --   --  0.95  --   --   TROPONINI  --   --  3.47*  --  2.26* 1.67*  --   --   --   --   < > = values in this interval not displayed.  Estimated Creatinine Clearance: 50.1 mL/min (by C-G formula based on Cr of 0.95).  Assessment: 69 YOF on heparin for NSTEMI. S/p cath on 7/29. Scheduled for CABG on 8/4 for severe 3V disease. She continues on IV heparin since being restarted after TR band removal for upcoming CABG. Heparin level is therapeutic. No bleeding noted.   Goal of Therapy:  Heparin level 0.3-0.7 units/ml Monitor platelets by anticoagulation protocol: Yes   Plan:  - Continue heparin gtt at 1150 units/hr - Confirm dosing with AM heparin level - Continue daily heparin level and CBC  10/4, PharmD, BCPS Pager # [phone] 06/02/2015 4:16 PM

## 2015-06-02 NOTE — Progress Notes (Signed)
ANTICOAGULATION CONSULT NOTE - Follow-up Consult  Pharmacy Consult for Heparin Indication: chest pain/ACS  No Known Allergies  Patient Measurements: Height: 4\' 11"  (149.9 cm) Weight: 166 lb (75.297 kg) IBW/kg (Calculated) : 43.2  Heparin Dosing Weight: 60.1 kg  Vital Signs: Temp: 98.5 F (36.9 C) (07/30 0757) Temp Source: Oral (07/30 0757) BP: 126/72 mmHg (07/30 0757) Pulse Rate: 82 (07/30 0757)  Labs:  Recent Labs  05/31/15 1700 05/31/15 1735 05/31/15 2129 06/01/15 0400 06/01/15 0854 06/01/15 1116 06/02/15 0254 06/02/15 0623  HGB 12.3  --   --  10.7*  --   --  9.9*  --   HCT 37.4  --   --  32.7*  --   --  31.5*  --   PLT 286  --   --  220  --   --  189  --   APTT  --  32  --   --   --   --   --   --   LABPROT  --  13.7 14.2  --   --   --   --   --   INR  --  1.03 1.08  --   --   --   --   --   HEPARINUNFRC  --   --   --  0.16*  --  0.36  --  0.25*  CREATININE 1.21*  --   --  1.01*  --   --  0.95  --   TROPONINI  --   --  3.47* 2.26* 1.67*  --   --   --     Estimated Creatinine Clearance: 50.1 mL/min (by C-G formula based on Cr of 0.95).  Assessment: 69 YOF on heparin for NSTEMI. S/p cath on 7/29. Scheduled for CABG on 8/4 for severe 3V disease. Pharmacy consulted for heparin gtt restart after TR band removal for upcoming CABG. Heparin level is subtherapeutic at 1000 units/hr. H/H low at 9.9/31.5, down from yesterday. Plts wnl. No issues with line and no bleeding noted.   Goal of Therapy:  Heparin level 0.3-0.7 units/ml Monitor platelets by anticoagulation protocol: Yes    Plan:  - heparin bolus 900 units, increase heparin gtt to 1150 units/hr - 1500 HL - Monitor daily heparin levels, CBC, s/sx of bleeding   09-24-1985, PharmD Clinical Pharmacist Pager # [phone] 06/02/2015 8:42 AM

## 2015-06-03 ENCOUNTER — Other Ambulatory Visit (HOSPITAL_COMMUNITY): Payer: Medicare Other

## 2015-06-03 LAB — CBC
HCT: 31.3 % — ABNORMAL LOW (ref 36.0–46.0)
Hemoglobin: 10 g/dL — ABNORMAL LOW (ref 12.0–15.0)
MCH: 29.2 pg (ref 26.0–34.0)
MCHC: 31.9 g/dL (ref 30.0–36.0)
MCV: 91.3 fL (ref 78.0–100.0)
Platelets: 178 10*3/uL (ref 150–400)
RBC: 3.43 MIL/uL — ABNORMAL LOW (ref 3.87–5.11)
RDW: 14.7 % (ref 11.5–15.5)
WBC: 7.2 10*3/uL (ref 4.0–10.5)

## 2015-06-03 LAB — ABO/RH: ABO/RH(D): O POS

## 2015-06-03 LAB — HEPARIN LEVEL (UNFRACTIONATED): Heparin Unfractionated: 0.4 IU/mL (ref 0.30–0.70)

## 2015-06-03 LAB — APTT: aPTT: 73 seconds — ABNORMAL HIGH (ref 24–37)

## 2015-06-03 LAB — PROTIME-INR
INR: 1.04 (ref 0.00–1.49)
Prothrombin Time: 13.8 seconds (ref 11.6–15.2)

## 2015-06-03 MED ORDER — DIAZEPAM 2 MG PO TABS: 2.0000 mg | ORAL_TABLET | Freq: Once | ORAL | Status: DC

## 2015-06-03 MED ORDER — CHLORHEXIDINE GLUCONATE 4 % EX LIQD
60.0000 mL | Freq: Once | CUTANEOUS | Status: AC
  Administered 2015-06-04: 4 via TOPICAL
  Filled 2015-06-03: qty 60

## 2015-06-03 MED ORDER — BISACODYL 5 MG PO TBEC: 5.0000 mg | DELAYED_RELEASE_TABLET | Freq: Once | ORAL | Status: DC

## 2015-06-03 MED ORDER — METOPROLOL TARTRATE 12.5 MG HALF TABLET
12.5000 mg | ORAL_TABLET | Freq: Once | ORAL | Status: AC
  Administered 2015-06-04: 12.5 mg via ORAL
  Filled 2015-06-03: qty 1

## 2015-06-03 MED ORDER — TEMAZEPAM 15 MG PO CAPS: 15.0000 mg | ORAL_CAPSULE | Freq: Once | ORAL | Status: AC | PRN

## 2015-06-03 MED ORDER — ALPRAZOLAM 0.25 MG PO TABS
0.2500 mg | ORAL_TABLET | ORAL | Status: DC | PRN
  Administered 2015-06-04: 0.25 mg via ORAL
  Filled 2015-06-03: qty 1

## 2015-06-03 MED ORDER — CHLORHEXIDINE GLUCONATE 4 % EX LIQD
60.0000 mL | Freq: Once | CUTANEOUS | Status: AC
  Administered 2015-06-03: 4 via TOPICAL
  Filled 2015-06-03: qty 60

## 2015-06-03 NOTE — Progress Notes (Signed)
Patient had episode which she describes as "big hot flash with sweating", nurse notified, and upon checking tele, pt had brief moment of HR down to 47, but it came right back up to 83 and sustaining NDR. BP 129/60. Patient states no CP, no SOB, no nausea, "just "hot flash". BP 129/60, HR 83, NSR.   Patient tearful. She states, "we were talking about some deep things". Patient has husband who is admitted on [address]. Pt states "she is trying to be strong."  This RN gave prn xanax dose. MD paged. Will continue top monitor.

## 2015-06-03 NOTE — Progress Notes (Signed)
ANTICOAGULATION CONSULT NOTE  Pharmacy Consult for Heparin Indication: chest pain/ACS  No Known Allergies  Patient Measurements: Height: 4\' 11"  (149.9 cm) Weight: 165 lb 12.8 oz (75.206 kg) IBW/kg (Calculated) : 43.2  Heparin Dosing Weight: 60.1 kg  Vital Signs: Temp: 98.2 F (36.8 C) (07/31 0806) Temp Source: Axillary (07/31 0806) BP: 132/64 mmHg (07/31 0806) Pulse Rate: 97 (07/31 0806)  Labs:  Recent Labs  05/31/15 1700 05/31/15 1735 05/31/15 2129 06/01/15 0400 06/01/15 0854  06/02/15 0254 06/02/15 0623 06/02/15 1503 06/03/15 0337  HGB 12.3  --   --  10.7*  --   --  9.9*  --   --  10.0*  HCT 37.4  --   --  32.7*  --   --  31.5*  --   --  31.3*  PLT 286  --   --  220  --   --  189  --   --  178  APTT  --  32  --   --   --   --   --   --   --   --   LABPROT  --  13.7 14.2  --   --   --   --   --   --   --   INR  --  1.03 1.08  --   --   --   --   --   --   --   HEPARINUNFRC  --   --   --  0.16*  --   < >  --  0.25* 0.50 0.40  CREATININE 1.21*  --   --  1.01*  --   --  0.95  --   --   --   TROPONINI  --   --  3.47* 2.26* 1.67*  --   --   --   --   --   < > = values in this interval not displayed.  Estimated Creatinine Clearance: 50.1 mL/min (by C-G formula based on Cr of 0.95).  Assessment: [name] on heparin for NSTEMI. S/p cath on 7/29. Scheduled for CABG on 8/4 for severe 3V disease. She continues on IV heparin since being restarted after TR band removal for upcoming CABG. Heparin level is therapeutic. No bleeding noted.    Goal of Therapy:  Heparin level 0.3-0.7 units/ml Monitor platelets by anticoagulation protocol: Yes    Plan:  - Continue heparin gtt at 1150 units/hr - Continue daily heparin level and CBC - F/u plans for CABG    10/4, PharmD, BCPS Clinical Pharmacist Pager: [phone] 06/03/2015 8:24 AM

## 2015-06-03 NOTE — Progress Notes (Signed)
2 Days Post-Op Procedure(s) (LRB): Left Heart Cath and Coronary Angiography (N/A) Subjective: No chest pain but had a hot flash associated with bradycardia this afternoon  Objective: Vital signs in last 24 hours: Temp:  [97.6 F (36.4 C)-98.4 F (36.9 C)] 98.4 F (36.9 C) (07/31 1623) Pulse Rate:  [80-97] 87 (07/31 1623) Cardiac Rhythm:  [-] Normal sinus rhythm (07/31 1629) Resp:  [13-18] 16 (07/31 1623) BP: (104-132)/(53-66) 104/66 mmHg (07/31 1623) SpO2:  [95 %-100 %] 98 % (07/31 1623) Weight:  [165 lb 12.8 oz (75.206 kg)] 165 lb 12.8 oz (75.206 kg) (07/31 0400)  Hemodynamic parameters for last 24 hours:    Intake/Output from previous day: 07/30 0701 - 07/31 0700 In: 1290 [P.O.:1080; I.V.:210] Out: -  Intake/Output this shift: Total I/O In: 1249 [P.O.:660; I.V.:589] Out: 825 [Urine:825]  unchanged  Lab Results:  Recent Labs  06/02/15 0254 06/03/15 0337  WBC 6.5 7.2  HGB 9.9* 10.0*  HCT 31.5* 31.3*  PLT 189 178   BMET:  Recent Labs  06/01/15 0400 06/02/15 0254  NA 138 139  K 3.3* 4.2  CL 99* 108  CO2 28 25  GLUCOSE 111* 111*  BUN 24* 14  CREATININE 1.01* 0.95  CALCIUM 9.6 9.4    PT/INR:  Recent Labs  05/31/15 2129  LABPROT 14.2  INR 1.08   ABG    Component Value Date/Time   PHART 7.357 07/02/2010 2049   HCO3 30.7* 07/02/2010 2049   TCO2 32.4 07/02/2010 2049   O2SAT 93.2 07/02/2010 2049   CBG (last 3)  No results for input(s): GLUCAP in the last 72 hours.  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

## 2015-06-03 NOTE — Progress Notes (Signed)
SUBJECTIVE:  No chest pain or epigastric pain since the admission.   Marland Kitchen acetaminophen  650 mg Oral QID  . amLODipine  10 mg Oral Daily  . antiseptic oral rinse  7 mL Mouth Rinse BID  . aspirin  81 mg Oral Daily  . atorvastatin  80 mg Oral q1800  . budesonide-formoterol  2 puff Inhalation BID  . citalopram  40 mg Oral Daily  . levothyroxine  150 mcg Oral QAC breakfast  . pantoprazole  40 mg Oral BID  . pneumococcal 23 valent vaccine  0.5 mL Intramuscular Tomorrow-1000  . sodium chloride  3 mL Intravenous Q12H  . traMADol  100 mg Oral Q lunch  . traMADol  50 mg Oral QAC breakfast  . traMADol  50 mg Oral Q supper   . heparin 1,150 Units/hr (06/02/15 0905)   Filed Vitals:   06/03/15 0000 06/03/15 0400 06/03/15 0806 06/03/15 0901  BP: 124/53 106/59 132/64   Pulse: 80 82 97   Temp: 98.2 F (36.8 C) 97.6 F (36.4 C) 98.2 F (36.8 C)   TempSrc: Oral Oral Axillary   Resp: 14 14 18    Height:      Weight:  165 lb 12.8 oz (75.206 kg)    SpO2: 95% 100% 96% 96%     Intake/Output Summary (Last 24 hours) at 06/03/15 1126 Last data filed at 06/03/15 0820  Gross per 24 hour  Intake   1200 ml  Output    200 ml  Net   1000 ml   LABS: Basic Metabolic Panel:  Recent Labs  06/05/15 2129 06/01/15 0400 06/02/15 0254  NA  --  138 139  K  --  3.3* 4.2  CL  --  99* 108  CO2  --  28 25  GLUCOSE  --  111* 111*  BUN  --  24* 14  CREATININE  --  1.01* 0.95  CALCIUM  --  9.6 9.4  MG 1.8  --   --    Liver Function Tests:  Recent Labs  06/01/15 1116  AST 26  ALT 16  ALKPHOS 46  BILITOT 0.5  PROT 6.4*  ALBUMIN 3.3*   CBC:  Recent Labs  06/02/15 0254 06/03/15 0337  WBC 6.5 7.2  HGB 9.9* 10.0*  HCT 31.5* 31.3*  MCV 90.5 91.3  PLT 189 178   Cardiac Enzymes:  Recent Labs  05/31/15 2129 06/01/15 0400 06/01/15 0854  TROPONINI 3.47* 2.26* 1.67*   Hemoglobin A1C:  Recent Labs  05/31/15 2129  HGBA1C 5.8*   Fasting Lipid Panel:  Recent Labs  06/01/15 0400   CHOL 149  HDL 34*  LDLCALC 90  TRIG 06/03/15  CHOLHDL 4.4   Thyroid Function Tests:  Recent Labs  05/31/15 2129  TSH 0.101*    RADIOLOGY: Dg Chest 2 View  05/31/2015   CLINICAL DATA:  Severe are current and upper chest pain for 3 weeks.  EXAM: CHEST  2 VIEW  COMPARISON:  6/2 8/60  FINDINGS: Heart size is normal. No pleural effusion identified. No interstitial edema or airspace consolidation. Chronic bronchitic changes are noted bilaterally.  IMPRESSION: 1. Bronchitic changes.   Electronically Signed   By: 9/60 M.D.   On: 05/31/2015 17:02    PHYSICAL EXAM   patient is oriented to person time and place. Affect is normal she having a 2-D echo at this time. Lungs are clear. Respiratory effort is nonlabored. Cardiac exam reveals S1 and S2.  ECHO: 06/01/2015 - Left  ventricle: The cavity size was normal. Wall thickness was increased in a pattern of mild LVH. Systolic function was normal. The estimated ejection fraction was in the range of 55% to 60%. There is hypokinesis of the basalinferolateral myocardium. Doppler parameters are consistent with abnormal left ventricular relaxation (grade 1 diastolic dysfunction). - Mitral valve: Calcified annulus. Mildly thickened leaflets .  Impressions: - Hypokinesis of the basal/mild inferior lateral wall with overall preserved LV function; grade 1 diastolic dysfunction; mild LVH; trace MR.  TELEMETRY:  SR   ASSESSMENT AND PLAN:  1. NSTEMI (non-ST elevated myocardial infarction)    three-vessel disease with preserved LV function. She has a long segmental calcified mid LAD, long RCA and high-grade proximal thrombotic appearing AV groove circumflex stenosis.  Referred for CABG, Dr Dorris Fetch doesn't have opening till Thursday, possibly will be done earlier by other T surgeons if available.   On aspirin and atorvastatin, metoprolol discontinued as she was bradycardic.  There is a inferior wall hypokinesis, we can consider  decreasing the dose of amlodipine and start ACEI.   2. HTN - controlled  3. Hyperlipidemia - on atorvastatin  4. Hypothyroidism     Her TSH is low at 0.1. T4 is high. She is on 200 g of thyroid replacement daily. It would appear that she should be on a lower dose. I will lower her dose to 150 g. This will need careful follow-up as an outpatient.   Lars Masson, MD 06/03/2015 11:26 AM

## 2015-06-04 ENCOUNTER — Inpatient Hospital Stay (HOSPITAL_COMMUNITY): Payer: Medicare Other

## 2015-06-04 ENCOUNTER — Other Ambulatory Visit (HOSPITAL_COMMUNITY): Payer: Self-pay | Admitting: Respiratory Therapy

## 2015-06-04 ENCOUNTER — Encounter (HOSPITAL_COMMUNITY)
Admission: EM | Disposition: A | Discharge status: Skilled Nursing Facility | Payer: Medicare Other | Source: Home / Self Care | Attending: Cardiovascular Disease

## 2015-06-04 ENCOUNTER — Encounter (HOSPITAL_COMMUNITY): Payer: Medicare Other

## 2015-06-04 ENCOUNTER — Inpatient Hospital Stay (HOSPITAL_COMMUNITY): Payer: Medicare Other | Admitting: Certified Registered Nurse Anesthetist

## 2015-06-04 ENCOUNTER — Encounter (HOSPITAL_COMMUNITY): Payer: Self-pay | Admitting: Cardiovascular Disease

## 2015-06-04 DIAGNOSIS — Z951 Presence of aortocoronary bypass graft: Secondary | ICD-10-CM

## 2015-06-04 DIAGNOSIS — I25110 Atherosclerotic heart disease of native coronary artery with unstable angina pectoris: Secondary | ICD-10-CM

## 2015-06-04 HISTORY — PX: CORONARY ARTERY BYPASS GRAFT: SHX141

## 2015-06-04 HISTORY — PX: TEE WITHOUT CARDIOVERSION: SHX5443

## 2015-06-04 HISTORY — DX: Presence of aortocoronary bypass graft: Z95.1

## 2015-06-04 LAB — POCT I-STAT, CHEM 8
BUN: 10 mg/dL (ref 6–20)
BUN: 10 mg/dL (ref 6–20)
BUN: 9 mg/dL (ref 6–20)
BUN: 9 mg/dL (ref 6–20)
BUN: 10 mg/dL (ref 6–20)
BUN: 11 mg/dL (ref 6–20)
Calcium, Ion: 1.35 mmol/L — ABNORMAL HIGH (ref 1.13–1.30)
Calcium, Ion: 1.12 mmol/L — ABNORMAL LOW (ref 1.13–1.30)
Calcium, Ion: 1.15 mmol/L (ref 1.13–1.30)
Calcium, Ion: 1.35 mmol/L — ABNORMAL HIGH (ref 1.13–1.30)
Calcium, Ion: 1.16 mmol/L (ref 1.13–1.30)
Calcium, Ion: 1.17 mmol/L (ref 1.13–1.30)
Chloride: 105 mmol/L (ref 101–111)
Chloride: 101 mmol/L (ref 101–111)
Chloride: 105 mmol/L (ref 101–111)
Chloride: 99 mmol/L — ABNORMAL LOW (ref 101–111)
Chloride: 101 mmol/L (ref 101–111)
Chloride: 102 mmol/L (ref 101–111)
Creatinine, Ser: 0.6 mg/dL (ref 0.44–1.00)
Creatinine, Ser: 0.7 mg/dL (ref 0.44–1.00)
Creatinine, Ser: 0.7 mg/dL (ref 0.44–1.00)
Creatinine, Ser: 0.7 mg/dL (ref 0.44–1.00)
Creatinine, Ser: 0.6 mg/dL (ref 0.44–1.00)
Creatinine, Ser: 0.7 mg/dL (ref 0.44–1.00)
Glucose, Bld: 111 mg/dL — ABNORMAL HIGH (ref 65–99)
Glucose, Bld: 100 mg/dL — ABNORMAL HIGH (ref 65–99)
Glucose, Bld: 102 mg/dL — ABNORMAL HIGH (ref 65–99)
Glucose, Bld: 102 mg/dL — ABNORMAL HIGH (ref 65–99)
Glucose, Bld: 124 mg/dL — ABNORMAL HIGH (ref 65–99)
Glucose, Bld: 128 mg/dL — ABNORMAL HIGH (ref 65–99)
HCT: 28 % — ABNORMAL LOW (ref 36.0–46.0)
HCT: 15 % — ABNORMAL LOW (ref 36.0–46.0)
HCT: 20 % — ABNORMAL LOW (ref 36.0–46.0)
HCT: 25 % — ABNORMAL LOW (ref 36.0–46.0)
HCT: 25 % — ABNORMAL LOW (ref 36.0–46.0)
HCT: 28 % — ABNORMAL LOW (ref 36.0–46.0)
Hemoglobin: 9.5 g/dL — ABNORMAL LOW (ref 12.0–15.0)
Hemoglobin: 5.1 g/dL — CL (ref 12.0–15.0)
Hemoglobin: 6.8 g/dL — CL (ref 12.0–15.0)
Hemoglobin: 8.5 g/dL — ABNORMAL LOW (ref 12.0–15.0)
Hemoglobin: 8.5 g/dL — ABNORMAL LOW (ref 12.0–15.0)
Hemoglobin: 9.5 g/dL — ABNORMAL LOW (ref 12.0–15.0)
Potassium: 3.8 mmol/L (ref 3.5–5.1)
Potassium: 4 mmol/L (ref 3.5–5.1)
Potassium: 4.1 mmol/L (ref 3.5–5.1)
Potassium: 5.4 mmol/L — ABNORMAL HIGH (ref 3.5–5.1)
Potassium: 5.2 mmol/L — ABNORMAL HIGH (ref 3.5–5.1)
Potassium: 5.8 mmol/L — ABNORMAL HIGH (ref 3.5–5.1)
Sodium: 141 mmol/L (ref 135–145)
Sodium: 134 mmol/L — ABNORMAL LOW (ref 135–145)
Sodium: 134 mmol/L — ABNORMAL LOW (ref 135–145)
Sodium: 140 mmol/L (ref 135–145)
Sodium: 135 mmol/L (ref 135–145)
Sodium: 137 mmol/L (ref 135–145)
TCO2: 26 mmol/L (ref 0–100)
TCO2: 22 mmol/L (ref 0–100)
TCO2: 24 mmol/L (ref 0–100)
TCO2: 26 mmol/L (ref 0–100)
TCO2: 21 mmol/L (ref 0–100)
TCO2: 26 mmol/L (ref 0–100)

## 2015-06-04 LAB — PULMONARY FUNCTION TEST
DL/VA % pred: 132 %
DL/VA: 5.17 ml/min/mmHg/L
DLCO unc % pred: 80 %
DLCO unc: 13.03 ml/min/mmHg
FEF 25-75 Post: 1.38 L/sec
FEF 25-75 Pre: 1.01 L/sec
FEF2575-%Change-Post: 35 %
FEF2575-%Pred-Post: 83 %
FEF2575-%Pred-Pre: 61 %
FEV1-%Change-Post: 6 %
FEV1-%Pred-Post: 67 %
FEV1-%Pred-Pre: 63 %
FEV1-Post: 1.2 L
FEV1-Pre: 1.13 L
FEV1FVC-%Change-Post: 6 %
FEV1FVC-%Pred-Pre: 102 %
FEV6-%Change-Post: 1 %
FEV6-%Pred-Post: 64 %
FEV6-%Pred-Pre: 63 %
FEV6-Post: 1.45 L
FEV6-Pre: 1.43 L
FEV6FVC-%Change-Post: 1 %
FEV6FVC-%Pred-Post: 105 %
FEV6FVC-%Pred-Pre: 103 %
FVC-%Change-Post: 0 %
FVC-%Pred-Post: 61 %
FVC-%Pred-Pre: 61 %
FVC-Post: 1.45 L
FVC-Pre: 1.45 L
Post FEV1/FVC ratio: 83 %
Post FEV6/FVC ratio: 100 %
Pre FEV1/FVC ratio: 78 %
Pre FEV6/FVC Ratio: 99 %
RV % pred: 176 %
RV: 3.27 L
TLC % pred: 117 %
TLC: 4.89 L

## 2015-06-04 LAB — POCT I-STAT 3, ART BLOOD GAS (G3+)
Acid-base deficit: 2 mmol/L (ref 0.0–2.0)
Acid-base deficit: 2 mmol/L (ref 0.0–2.0)
Bicarbonate: 24.5 mEq/L — ABNORMAL HIGH (ref 20.0–24.0)
Bicarbonate: 24.5 mEq/L — ABNORMAL HIGH (ref 20.0–24.0)
Bicarbonate: 24.7 mEq/L — ABNORMAL HIGH (ref 20.0–24.0)
Bicarbonate: 27.3 mEq/L — ABNORMAL HIGH (ref 20.0–24.0)
O2 Saturation: 100 %
O2 Saturation: 100 %
O2 Saturation: 98 %
O2 Saturation: 99 %
Patient temperature: 36.4
Patient temperature: 36.5
Patient temperature: 36.7
TCO2: 26 mmol/L (ref 0–100)
TCO2: 26 mmol/L (ref 0–100)
TCO2: 26 mmol/L (ref 0–100)
TCO2: 29 mmol/L (ref 0–100)
pCO2 arterial: 41 mmHg (ref 35.0–45.0)
pCO2 arterial: 45.6 mmHg — ABNORMAL HIGH (ref 35.0–45.0)
pCO2 arterial: 51 mmHg — ABNORMAL HIGH (ref 35.0–45.0)
pCO2 arterial: 59.3 mmHg (ref 35.0–45.0)
pH, Arterial: 7.269 — ABNORMAL LOW (ref 7.350–7.450)
pH, Arterial: 7.288 — ABNORMAL LOW (ref 7.350–7.450)
pH, Arterial: 7.335 — ABNORMAL LOW (ref 7.350–7.450)
pH, Arterial: 7.388 (ref 7.350–7.450)
pO2, Arterial: 114 mmHg — ABNORMAL HIGH (ref 80.0–100.0)
pO2, Arterial: 148 mmHg — ABNORMAL HIGH (ref 80.0–100.0)
pO2, Arterial: 195 mmHg — ABNORMAL HIGH (ref 80.0–100.0)
pO2, Arterial: 230 mmHg — ABNORMAL HIGH (ref 80.0–100.0)

## 2015-06-04 LAB — BASIC METABOLIC PANEL
Anion gap: 6 (ref 5–15)
BUN: 11 mg/dL (ref 6–20)
CO2: 28 mmol/L (ref 22–32)
Calcium: 9.9 mg/dL (ref 8.9–10.3)
Chloride: 107 mmol/L (ref 101–111)
Creatinine, Ser: 0.89 mg/dL (ref 0.44–1.00)
GFR calc Af Amer: >60 mL/min (ref >60)
GFR calc non Af Amer: >60 mL/min (ref >60)
Glucose, Bld: 104 mg/dL — ABNORMAL HIGH (ref 65–99)
Potassium: 4.4 mmol/L (ref 3.5–5.1)
Sodium: 141 mmol/L (ref 135–145)

## 2015-06-04 LAB — PREPARE RBC (CROSSMATCH)

## 2015-06-04 LAB — CBC
HCT: 32.1 % — ABNORMAL LOW (ref 36.0–46.0)
HCT: 30.2 % — ABNORMAL LOW (ref 36.0–46.0)
Hemoglobin: 10.4 g/dL — ABNORMAL LOW (ref 12.0–15.0)
Hemoglobin: 9.9 g/dL — ABNORMAL LOW (ref 12.0–15.0)
MCH: 29.5 pg (ref 26.0–34.0)
MCH: 29.5 pg (ref 26.0–34.0)
MCHC: 32.4 g/dL (ref 30.0–36.0)
MCHC: 32.8 g/dL (ref 30.0–36.0)
MCV: 90.9 fL (ref 78.0–100.0)
MCV: 89.9 fL (ref 78.0–100.0)
Platelets: 185 10*3/uL (ref 150–400)
Platelets: 131 10*3/uL — ABNORMAL LOW (ref 150–400)
RBC: 3.53 MIL/uL — ABNORMAL LOW (ref 3.87–5.11)
RBC: 3.36 MIL/uL — ABNORMAL LOW (ref 3.87–5.11)
RDW: 14.7 % (ref 11.5–15.5)
RDW: 14.8 % (ref 11.5–15.5)
WBC: 7.1 10*3/uL (ref 4.0–10.5)
WBC: 11.2 10*3/uL — ABNORMAL HIGH (ref 4.0–10.5)

## 2015-06-04 LAB — PLATELET COUNT: Platelets: 113 10*3/uL — ABNORMAL LOW (ref 150–400)

## 2015-06-04 LAB — GLUCOSE, CAPILLARY
Glucose-Capillary: 112 mg/dL — ABNORMAL HIGH (ref 65–99)
Glucose-Capillary: 115 mg/dL — ABNORMAL HIGH (ref 65–99)
Glucose-Capillary: 119 mg/dL — ABNORMAL HIGH (ref 65–99)
Glucose-Capillary: 126 mg/dL — ABNORMAL HIGH (ref 65–99)

## 2015-06-04 LAB — POCT I-STAT 4, (NA,K, GLUC, HGB,HCT)
Glucose, Bld: 139 mg/dL — ABNORMAL HIGH (ref 65–99)
HCT: 32 % — ABNORMAL LOW (ref 36.0–46.0)
Hemoglobin: 10.9 g/dL — ABNORMAL LOW (ref 12.0–15.0)
Potassium: 4.3 mmol/L (ref 3.5–5.1)
Sodium: 138 mmol/L (ref 135–145)

## 2015-06-04 LAB — PROTIME-INR
INR: 1.3 (ref 0.00–1.49)
Prothrombin Time: 16.3 seconds — ABNORMAL HIGH (ref 11.6–15.2)

## 2015-06-04 LAB — APTT: aPTT: 32 seconds (ref 24–37)

## 2015-06-04 LAB — HEPARIN LEVEL (UNFRACTIONATED): Heparin Unfractionated: 0.47 IU/mL (ref 0.30–0.70)

## 2015-06-04 SURGERY — CORONARY ARTERY BYPASS GRAFTING (CABG)
Anesthesia: General | Site: Chest

## 2015-06-04 MED ORDER — SODIUM CHLORIDE 0.9 % IV SOLN
INTRAVENOUS | Status: AC
  Administered 2015-06-04: 69.8 mL/h via INTRAVENOUS
  Filled 2015-06-04: qty 40

## 2015-06-04 MED ORDER — HEPARIN SODIUM (PORCINE) 1000 UNIT/ML IJ SOLN
INTRAMUSCULAR | Status: DC | PRN
  Administered 2015-06-04: 25000 [IU] via INTRAVENOUS

## 2015-06-04 MED ORDER — BISACODYL 10 MG RE SUPP: 10.0000 mg | Freq: Every day | RECTAL | Status: DC

## 2015-06-04 MED ORDER — NITROGLYCERIN IN D5W 200-5 MCG/ML-% IV SOLN
2.0000 ug/min | INTRAVENOUS | Status: DC
  Filled 2015-06-04: qty 250

## 2015-06-04 MED ORDER — DOCUSATE SODIUM 100 MG PO CAPS
200.0000 mg | ORAL_CAPSULE | Freq: Every day | ORAL | Status: DC
  Administered 2015-06-05: 200 mg via ORAL
  Filled 2015-06-04: qty 2

## 2015-06-04 MED ORDER — ACETAMINOPHEN 650 MG RE SUPP: 650.0000 mg | Freq: Once | RECTAL | Status: DC

## 2015-06-04 MED ORDER — PROTAMINE SULFATE 10 MG/ML IV SOLN
INTRAVENOUS | Status: DC | PRN
  Administered 2015-06-04: 200 mg via INTRAVENOUS

## 2015-06-04 MED ORDER — SODIUM CHLORIDE 0.45 % IV SOLN
INTRAVENOUS | Status: DC | PRN
  Administered 2015-06-04: 19:00:00 via INTRAVENOUS

## 2015-06-04 MED ORDER — DEXTROSE 5 % IV SOLN
1.5000 g | Freq: Two times a day (BID) | INTRAVENOUS | Status: DC
  Administered 2015-06-05 – 2015-06-06 (×3): 1.5 g via INTRAVENOUS
  Filled 2015-06-04 (×3): qty 1.5

## 2015-06-04 MED ORDER — FENTANYL CITRATE (PF) 100 MCG/2ML IJ SOLN
INTRAMUSCULAR | Status: AC
  Filled 2015-06-04: qty 2

## 2015-06-04 MED ORDER — MORPHINE SULFATE 2 MG/ML IJ SOLN: 1.0000 mg | INTRAMUSCULAR | Status: AC | PRN

## 2015-06-04 MED ORDER — DEXTROSE 5 % IV SOLN
750.0000 mg | INTRAVENOUS | Status: DC
  Filled 2015-06-04: qty 750

## 2015-06-04 MED ORDER — LACTATED RINGERS IV SOLN
INTRAVENOUS | Status: DC | PRN
  Administered 2015-06-04: 14:00:00 via INTRAVENOUS

## 2015-06-04 MED ORDER — SODIUM CHLORIDE 0.9 % IV SOLN
INTRAVENOUS | Status: DC | PRN
  Administered 2015-06-04: 14:00:00 via INTRAVENOUS

## 2015-06-04 MED ORDER — PHENYLEPHRINE HCL 10 MG/ML IJ SOLN
INTRAMUSCULAR | Status: DC | PRN
  Administered 2015-06-04 (×2): 80 ug via INTRAVENOUS
  Administered 2015-06-04: 40 ug via INTRAVENOUS

## 2015-06-04 MED ORDER — METOPROLOL TARTRATE 1 MG/ML IV SOLN
2.5000 mg | INTRAVENOUS | Status: DC | PRN
  Administered 2015-06-05: 5 mg via INTRAVENOUS
  Administered 2015-06-05: 2.5 mg via INTRAVENOUS
  Filled 2015-06-04: qty 5

## 2015-06-04 MED ORDER — MAGNESIUM SULFATE 4 GM/100ML IV SOLN
4.0000 g | Freq: Once | INTRAVENOUS | Status: AC
  Administered 2015-06-04: 4 g via INTRAVENOUS
  Filled 2015-06-04: qty 100

## 2015-06-04 MED ORDER — THROMBIN 20000 UNITS EX SOLR
CUTANEOUS | Status: DC | PRN
  Administered 2015-06-04: 20000 [IU] via TOPICAL

## 2015-06-04 MED ORDER — ACETAMINOPHEN 160 MG/5ML PO SOLN: 650.0000 mg | Freq: Once | ORAL | Status: DC

## 2015-06-04 MED ORDER — METOPROLOL TARTRATE 25 MG/10 ML ORAL SUSPENSION
12.5000 mg | Freq: Two times a day (BID) | ORAL | Status: DC
  Filled 2015-06-04 (×5): qty 5

## 2015-06-04 MED ORDER — MIDAZOLAM HCL 2 MG/2ML IJ SOLN
INTRAMUSCULAR | Status: AC
  Filled 2015-06-04: qty 2

## 2015-06-04 MED ORDER — FENTANYL CITRATE (PF) 100 MCG/2ML IJ SOLN
INTRAMUSCULAR | Status: DC | PRN
  Administered 2015-06-04: 1250 ug via INTRAVENOUS
  Administered 2015-06-04: 250 ug via INTRAVENOUS
  Administered 2015-06-04 (×3): 50 ug via INTRAVENOUS

## 2015-06-04 MED ORDER — LACTATED RINGERS IV SOLN: INTRAVENOUS | Status: DC

## 2015-06-04 MED ORDER — POTASSIUM CHLORIDE 2 MEQ/ML IV SOLN
80.0000 meq | INTRAVENOUS | Status: DC
  Filled 2015-06-04: qty 40

## 2015-06-04 MED ORDER — DEXMEDETOMIDINE HCL IN NACL 400 MCG/100ML IV SOLN
INTRAVENOUS | Status: DC | PRN
Start: 2015-06-04 — End: 2015-06-04
  Administered 2015-06-04: .3 ug/kg/h via INTRAVENOUS

## 2015-06-04 MED ORDER — SODIUM CHLORIDE 0.9 % IJ SOLN
INTRAMUSCULAR | Status: AC
  Filled 2015-06-04: qty 10

## 2015-06-04 MED ORDER — SODIUM BICARBONATE 8.4 % IV SOLN
50.0000 meq | Freq: Once | INTRAVENOUS | Status: AC
  Administered 2015-06-04: 50 meq via INTRAVENOUS

## 2015-06-04 MED ORDER — MIDAZOLAM HCL 10 MG/2ML IJ SOLN
INTRAMUSCULAR | Status: AC
  Filled 2015-06-04: qty 4

## 2015-06-04 MED ORDER — FENTANYL CITRATE (PF) 250 MCG/5ML IJ SOLN
INTRAMUSCULAR | Status: AC
  Filled 2015-06-04: qty 5

## 2015-06-04 MED ORDER — LACTATED RINGERS IV SOLN
INTRAVENOUS | Status: DC | PRN
  Administered 2015-06-04 (×2): via INTRAVENOUS

## 2015-06-04 MED ORDER — HEMOSTATIC AGENTS (NO CHARGE) OPTIME
TOPICAL | Status: DC | PRN
  Administered 2015-06-04: 1 via TOPICAL

## 2015-06-04 MED ORDER — DEXTROSE 5 % IV SOLN
30.0000 ug/min | INTRAVENOUS | Status: DC
Start: 2015-06-04 — End: 2015-06-04
  Filled 2015-06-04: qty 2

## 2015-06-04 MED ORDER — DEXMEDETOMIDINE HCL IN NACL 200 MCG/50ML IV SOLN
0.0000 ug/kg/h | INTRAVENOUS | Status: DC
  Administered 2015-06-04: 0.7 ug/kg/h via INTRAVENOUS
  Filled 2015-06-04: qty 50

## 2015-06-04 MED ORDER — EPINEPHRINE HCL 1 MG/ML IJ SOLN
0.0000 ug/min | INTRAVENOUS | Status: DC
  Filled 2015-06-04: qty 4

## 2015-06-04 MED ORDER — ONDANSETRON HCL 4 MG/2ML IJ SOLN
4.0000 mg | Freq: Four times a day (QID) | INTRAMUSCULAR | Status: DC | PRN
  Administered 2015-06-05: 4 mg via INTRAVENOUS
  Filled 2015-06-04: qty 2

## 2015-06-04 MED ORDER — INSULIN REGULAR BOLUS VIA INFUSION
0.0000 [IU] | Freq: Three times a day (TID) | INTRAVENOUS | Status: DC
  Filled 2015-06-04: qty 10

## 2015-06-04 MED ORDER — LACTATED RINGERS IV SOLN
INTRAVENOUS | Status: DC
  Administered 2015-06-04: 20:00:00 via INTRAVENOUS

## 2015-06-04 MED ORDER — PHENYLEPHRINE HCL 10 MG/ML IJ SOLN
30.0000 ug/min | INTRAVENOUS | Status: DC
  Filled 2015-06-04: qty 2

## 2015-06-04 MED ORDER — LIDOCAINE HCL (CARDIAC) 20 MG/ML IV SOLN
INTRAVENOUS | Status: DC | PRN
  Administered 2015-06-04: 100 mg via INTRAVENOUS

## 2015-06-04 MED ORDER — DOPAMINE-DEXTROSE 3.2-5 MG/ML-% IV SOLN
0.0000 ug/kg/min | INTRAVENOUS | Status: DC
  Filled 2015-06-04: qty 250

## 2015-06-04 MED ORDER — VANCOMYCIN HCL 10 G IV SOLR
1250.0000 mg | INTRAVENOUS | Status: AC
  Administered 2015-06-04: 1250 mg via INTRAVENOUS
  Filled 2015-06-04: qty 1250

## 2015-06-04 MED ORDER — SODIUM CHLORIDE 0.9 % IV SOLN
INTRAVENOUS | Status: DC
  Filled 2015-06-04: qty 40

## 2015-06-04 MED ORDER — ARTIFICIAL TEARS OP OINT
TOPICAL_OINTMENT | OPHTHALMIC | Status: DC | PRN
  Administered 2015-06-04: 1 via OPHTHALMIC

## 2015-06-04 MED ORDER — MIDAZOLAM HCL 2 MG/2ML IJ SOLN
INTRAMUSCULAR | Status: AC
  Filled 2015-06-04: qty 4

## 2015-06-04 MED ORDER — FAMOTIDINE IN NACL 20-0.9 MG/50ML-% IV SOLN
20.0000 mg | Freq: Two times a day (BID) | INTRAVENOUS | Status: AC
  Administered 2015-06-04: 20 mg via INTRAVENOUS
  Filled 2015-06-04: qty 50

## 2015-06-04 MED ORDER — SODIUM CHLORIDE 0.9 % IV SOLN
INTRAVENOUS | Status: DC
  Filled 2015-06-04: qty 2.5

## 2015-06-04 MED ORDER — BISACODYL 5 MG PO TBEC
10.0000 mg | DELAYED_RELEASE_TABLET | Freq: Every day | ORAL | Status: DC
  Administered 2015-06-05: 10 mg via ORAL
  Filled 2015-06-04: qty 2

## 2015-06-04 MED ORDER — METOPROLOL TARTRATE 12.5 MG HALF TABLET
12.5000 mg | ORAL_TABLET | Freq: Two times a day (BID) | ORAL | Status: DC
  Administered 2015-06-05 (×2): 12.5 mg via ORAL
  Filled 2015-06-04 (×5): qty 1

## 2015-06-04 MED ORDER — CHLORHEXIDINE GLUCONATE 0.12 % MT SOLN
15.0000 mL | Freq: Two times a day (BID) | OROMUCOSAL | Status: DC
  Administered 2015-06-04 – 2015-06-05 (×2): 15 mL via OROMUCOSAL
  Filled 2015-06-04 (×3): qty 15

## 2015-06-04 MED ORDER — PHENYLEPHRINE HCL 10 MG/ML IJ SOLN
0.0000 ug/min | INTRAVENOUS | Status: DC
  Filled 2015-06-04: qty 2

## 2015-06-04 MED ORDER — SODIUM CHLORIDE 0.9 % IV SOLN
250.0000 [IU] | INTRAVENOUS | Status: DC | PRN
  Administered 2015-06-04: 1 [IU]/h via INTRAVENOUS

## 2015-06-04 MED ORDER — DEXMEDETOMIDINE HCL IN NACL 200 MCG/50ML IV SOLN
INTRAVENOUS | Status: AC
  Filled 2015-06-04: qty 50

## 2015-06-04 MED ORDER — PLASMA-LYTE 148 IV SOLN
INTRAVENOUS | Status: AC
  Administered 2015-06-04: 500 mL
  Filled 2015-06-04: qty 2.5

## 2015-06-04 MED ORDER — ASPIRIN 81 MG PO CHEW
324.0000 mg | CHEWABLE_TABLET | Freq: Every day | ORAL | Status: DC
  Administered 2015-06-05: 324 mg
  Filled 2015-06-04: qty 4

## 2015-06-04 MED ORDER — PHENYLEPHRINE 40 MCG/ML (10ML) SYRINGE FOR IV PUSH (FOR BLOOD PRESSURE SUPPORT)
PREFILLED_SYRINGE | INTRAVENOUS | Status: AC
  Filled 2015-06-04: qty 20

## 2015-06-04 MED ORDER — DEXTROSE 5 % IV SOLN
1.5000 g | INTRAVENOUS | Status: AC
  Administered 2015-06-04: 1.5 g via INTRAVENOUS
  Administered 2015-06-04: .75 g via INTRAVENOUS
  Filled 2015-06-04: qty 1.5

## 2015-06-04 MED ORDER — NITROGLYCERIN IN D5W 200-5 MCG/ML-% IV SOLN
INTRAVENOUS | Status: DC | PRN
  Administered 2015-06-04: 5 ug/min via INTRAVENOUS

## 2015-06-04 MED ORDER — THROMBIN 20000 UNITS EX SOLR
CUTANEOUS | Status: AC
  Filled 2015-06-04: qty 20000

## 2015-06-04 MED ORDER — MORPHINE SULFATE 2 MG/ML IJ SOLN
2.0000 mg | INTRAMUSCULAR | Status: DC | PRN
  Administered 2015-06-05 (×2): 2 mg via INTRAVENOUS
  Filled 2015-06-04 (×2): qty 1

## 2015-06-04 MED ORDER — TRAMADOL HCL 50 MG PO TABS
50.0000 mg | ORAL_TABLET | ORAL | Status: DC | PRN
  Administered 2015-06-05 (×2): 100 mg via ORAL
  Administered 2015-06-06: 50 mg via ORAL
  Filled 2015-06-04: qty 1
  Filled 2015-06-04 (×2): qty 2

## 2015-06-04 MED ORDER — ARTIFICIAL TEARS OP OINT
TOPICAL_OINTMENT | OPHTHALMIC | Status: AC
  Filled 2015-06-04: qty 7

## 2015-06-04 MED ORDER — NITROGLYCERIN IN D5W 200-5 MCG/ML-% IV SOLN: 0.0000 ug/min | INTRAVENOUS | Status: DC

## 2015-06-04 MED ORDER — ALBUMIN HUMAN 5 % IV SOLN
250.0000 mL | INTRAVENOUS | Status: AC | PRN
  Administered 2015-06-04: 250 mL via INTRAVENOUS

## 2015-06-04 MED ORDER — DEXMEDETOMIDINE HCL IN NACL 400 MCG/100ML IV SOLN
0.1000 ug/kg/h | INTRAVENOUS | Status: DC
  Filled 2015-06-04: qty 100

## 2015-06-04 MED ORDER — ROCURONIUM BROMIDE 100 MG/10ML IV SOLN
INTRAVENOUS | Status: DC | PRN
  Administered 2015-06-04 (×3): 50 mg via INTRAVENOUS

## 2015-06-04 MED ORDER — INSULIN ASPART 100 UNIT/ML ~~LOC~~ SOLN
0.0000 [IU] | SUBCUTANEOUS | Status: DC
  Administered 2015-06-04 – 2015-06-05 (×3): 2 [IU] via SUBCUTANEOUS

## 2015-06-04 MED ORDER — POTASSIUM CHLORIDE 10 MEQ/50ML IV SOLN: 10.0000 meq | INTRAVENOUS | Status: DC

## 2015-06-04 MED ORDER — SODIUM CHLORIDE 0.9 % IV SOLN
250.0000 mL | INTRAVENOUS | Status: DC
  Administered 2015-06-05: 250 mL via INTRAVENOUS

## 2015-06-04 MED ORDER — LACTATED RINGERS IV SOLN: 500.0000 mL | Freq: Once | INTRAVENOUS | Status: AC | PRN

## 2015-06-04 MED ORDER — ACETAMINOPHEN 160 MG/5ML PO SOLN
1000.0000 mg | Freq: Four times a day (QID) | ORAL | Status: DC
  Filled 2015-06-04: qty 40.6

## 2015-06-04 MED ORDER — PHENYLEPHRINE HCL 10 MG/ML IJ SOLN
20.0000 mg | INTRAVENOUS | Status: DC | PRN
  Administered 2015-06-04: 10 ug/min via INTRAVENOUS

## 2015-06-04 MED ORDER — SODIUM CHLORIDE 0.9 % IJ SOLN
3.0000 mL | Freq: Two times a day (BID) | INTRAMUSCULAR | Status: DC
  Administered 2015-06-05 (×2): 3 mL via INTRAVENOUS

## 2015-06-04 MED ORDER — VANCOMYCIN HCL IN DEXTROSE 1-5 GM/200ML-% IV SOLN
1000.0000 mg | Freq: Once | INTRAVENOUS | Status: AC
  Administered 2015-06-05: 1000 mg via INTRAVENOUS
  Filled 2015-06-04: qty 200

## 2015-06-04 MED ORDER — ALBUTEROL SULFATE (2.5 MG/3ML) 0.083% IN NEBU
2.5000 mg | INHALATION_SOLUTION | Freq: Once | RESPIRATORY_TRACT | Status: AC
  Administered 2015-06-04: 2.5 mg via RESPIRATORY_TRACT

## 2015-06-04 MED ORDER — SODIUM CHLORIDE 0.9 % IV SOLN
INTRAVENOUS | Status: DC
  Filled 2015-06-04: qty 30

## 2015-06-04 MED ORDER — OXYCODONE HCL 5 MG PO TABS: 5.0000 mg | ORAL_TABLET | ORAL | Status: DC | PRN

## 2015-06-04 MED ORDER — PROPOFOL 10 MG/ML IV BOLUS
INTRAVENOUS | Status: DC | PRN
  Administered 2015-06-04: 60 mg via INTRAVENOUS

## 2015-06-04 MED ORDER — ACETAMINOPHEN 500 MG PO TABS
1000.0000 mg | ORAL_TABLET | Freq: Four times a day (QID) | ORAL | Status: DC
  Administered 2015-06-05 – 2015-06-06 (×5): 1000 mg via ORAL
  Filled 2015-06-04 (×10): qty 2

## 2015-06-04 MED ORDER — SODIUM CHLORIDE 0.9 % IJ SOLN: 3.0000 mL | INTRAMUSCULAR | Status: DC | PRN

## 2015-06-04 MED ORDER — EPHEDRINE SULFATE 50 MG/ML IJ SOLN
INTRAMUSCULAR | Status: AC
  Filled 2015-06-04: qty 1

## 2015-06-04 MED ORDER — MAGNESIUM SULFATE 50 % IJ SOLN
40.0000 meq | INTRAMUSCULAR | Status: DC
  Filled 2015-06-04: qty 10

## 2015-06-04 MED ORDER — MIDAZOLAM HCL 2 MG/2ML IJ SOLN: 2.0000 mg | INTRAMUSCULAR | Status: DC | PRN

## 2015-06-04 MED ORDER — MIDAZOLAM HCL 5 MG/5ML IJ SOLN
INTRAMUSCULAR | Status: DC | PRN
  Administered 2015-06-04 (×2): 1 mg via INTRAVENOUS
  Administered 2015-06-04 (×2): 2 mg via INTRAVENOUS
  Administered 2015-06-04: 1 mg via INTRAVENOUS
  Administered 2015-06-04 (×2): 2 mg via INTRAVENOUS
  Administered 2015-06-04: 3 mg via INTRAVENOUS

## 2015-06-04 MED ORDER — CETYLPYRIDINIUM CHLORIDE 0.05 % MT LIQD
7.0000 mL | Freq: Four times a day (QID) | OROMUCOSAL | Status: DC
  Administered 2015-06-05 (×4): 7 mL via OROMUCOSAL

## 2015-06-04 MED ORDER — DOPAMINE-DEXTROSE 3.2-5 MG/ML-% IV SOLN: 0.0000 ug/kg/min | INTRAVENOUS | Status: DC

## 2015-06-04 MED ORDER — ASPIRIN EC 325 MG PO TBEC
325.0000 mg | DELAYED_RELEASE_TABLET | Freq: Every day | ORAL | Status: DC
  Filled 2015-06-04 (×2): qty 1

## 2015-06-04 MED ORDER — ROCURONIUM BROMIDE 50 MG/5ML IV SOLN
INTRAVENOUS | Status: AC
  Filled 2015-06-04: qty 1

## 2015-06-04 MED ORDER — SODIUM CHLORIDE 0.9 % IV SOLN
INTRAVENOUS | Status: DC
  Administered 2015-06-04: 21:00:00 via INTRAVENOUS

## 2015-06-04 SURGICAL SUPPLY — 97 items
BAG DECANTER FOR FLEXI CONT (MISCELLANEOUS) ×3 IMPLANT
BANDAGE ELASTIC 4 VELCRO ST LF (GAUZE/BANDAGES/DRESSINGS) ×3 IMPLANT
BANDAGE ELASTIC 6 VELCRO ST LF (GAUZE/BANDAGES/DRESSINGS) ×3 IMPLANT
BASKET HEART (ORDER IN 25'S) (MISCELLANEOUS) ×1
BASKET HEART (ORDER IN 25S) (MISCELLANEOUS) ×2 IMPLANT
BLADE STERNUM SYSTEM 6 (BLADE) ×3 IMPLANT
BNDG GAUZE ELAST 4 BULKY (GAUZE/BANDAGES/DRESSINGS) ×3 IMPLANT
CANISTER SUCTION 2500CC (MISCELLANEOUS) ×3 IMPLANT
CANNULA ARTERIAL VENT 3/8 20FR (CANNULA) ×3 IMPLANT
CATH ROBINSON RED A/P 18FR (CATHETERS) ×6 IMPLANT
CATH THORACIC 28FR (CATHETERS) ×3 IMPLANT
CATH THORACIC 36FR (CATHETERS) ×3 IMPLANT
CATH THORACIC 36FR RT ANG (CATHETERS) ×3 IMPLANT
CLIP TI MEDIUM 24 (CLIP) IMPLANT
CLIP TI WIDE RED SMALL 24 (CLIP) ×3 IMPLANT
COVER SURGICAL LIGHT HANDLE (MISCELLANEOUS) ×3 IMPLANT
CRADLE DONUT ADULT HEAD (MISCELLANEOUS) ×3 IMPLANT
DRAPE CARDIOVASCULAR INCISE (DRAPES) ×3
DRAPE SLUSH/WARMER DISC (DRAPES) ×3 IMPLANT
DRAPE SRG 135X102X78XABS (DRAPES) ×2 IMPLANT
DRSG COVADERM 4X14 (GAUZE/BANDAGES/DRESSINGS) ×3 IMPLANT
ELECT CAUTERY BLADE 6.4 (BLADE) ×3 IMPLANT
ELECT REM PT RETURN 9FT ADLT (ELECTROSURGICAL) ×6
ELECTRODE REM PT RTRN 9FT ADLT (ELECTROSURGICAL) ×4 IMPLANT
GAUZE SPONGE 4X4 12PLY STRL (GAUZE/BANDAGES/DRESSINGS) ×6 IMPLANT
GLOVE BIO SURGEON STRL SZ 6 (GLOVE) IMPLANT
GLOVE BIO SURGEON STRL SZ 6.5 (GLOVE) ×18 IMPLANT
GLOVE BIO SURGEON STRL SZ7 (GLOVE) IMPLANT
GLOVE BIO SURGEON STRL SZ7.5 (GLOVE) IMPLANT
GLOVE BIOGEL PI IND STRL 6 (GLOVE) IMPLANT
GLOVE BIOGEL PI IND STRL 6.5 (GLOVE) ×10 IMPLANT
GLOVE BIOGEL PI IND STRL 7.0 (GLOVE) IMPLANT
GLOVE BIOGEL PI INDICATOR 6 (GLOVE)
GLOVE BIOGEL PI INDICATOR 6.5 (GLOVE) ×5
GLOVE BIOGEL PI INDICATOR 7.0 (GLOVE)
GLOVE EUDERMIC 7 POWDERFREE (GLOVE) ×6 IMPLANT
GLOVE ORTHO TXT STRL SZ7.5 (GLOVE) IMPLANT
GOWN STRL REUS W/ TWL LRG LVL3 (GOWN DISPOSABLE) ×12 IMPLANT
GOWN STRL REUS W/ TWL XL LVL3 (GOWN DISPOSABLE) ×2 IMPLANT
GOWN STRL REUS W/TWL LRG LVL3 (GOWN DISPOSABLE) ×18
GOWN STRL REUS W/TWL XL LVL3 (GOWN DISPOSABLE) ×3
HEMOSTAT POWDER SURGIFOAM 1G (HEMOSTASIS) ×9 IMPLANT
HEMOSTAT SURGICEL 2X14 (HEMOSTASIS) ×3 IMPLANT
INSERT FOGARTY 61MM (MISCELLANEOUS) IMPLANT
INSERT FOGARTY XLG (MISCELLANEOUS) IMPLANT
KIT BASIN OR (CUSTOM PROCEDURE TRAY) ×3 IMPLANT
KIT CATH CPB BARTLE (MISCELLANEOUS) ×3 IMPLANT
KIT ROOM TURNOVER OR (KITS) ×3 IMPLANT
KIT SUCTION CATH 14FR (SUCTIONS) ×3 IMPLANT
KIT VASOVIEW W/TROCAR VH 2000 (KITS) ×3 IMPLANT
NS IRRIG 1000ML POUR BTL (IV SOLUTION) ×15 IMPLANT
PACK OPEN HEART (CUSTOM PROCEDURE TRAY) ×3 IMPLANT
PAD ARMBOARD 7.5X6 YLW CONV (MISCELLANEOUS) ×6 IMPLANT
PAD ELECT DEFIB RADIOL ZOLL (MISCELLANEOUS) ×3 IMPLANT
PENCIL BUTTON HOLSTER BLD 10FT (ELECTRODE) ×3 IMPLANT
PUNCH AORTIC ROTATE 4.0MM (MISCELLANEOUS) IMPLANT
PUNCH AORTIC ROTATE 4.5MM 8IN (MISCELLANEOUS) ×3 IMPLANT
PUNCH AORTIC ROTATE 5MM 8IN (MISCELLANEOUS) IMPLANT
SET CARDIOPLEGIA MPS 5001102 (MISCELLANEOUS) ×3 IMPLANT
SPONGE GAUZE 4X4 12PLY STER LF (GAUZE/BANDAGES/DRESSINGS) ×6 IMPLANT
SPONGE INTESTINAL PEANUT (DISPOSABLE) IMPLANT
SPONGE LAP 18X18 X RAY DECT (DISPOSABLE) ×3 IMPLANT
SPONGE LAP 4X18 X RAY DECT (DISPOSABLE) ×3 IMPLANT
SUT BONE WAX W31G (SUTURE) ×3 IMPLANT
SUT MNCRL AB 4-0 PS2 18 (SUTURE) ×3 IMPLANT
SUT PROLENE 3 0 SH DA (SUTURE) IMPLANT
SUT PROLENE 3 0 SH1 36 (SUTURE) ×3 IMPLANT
SUT PROLENE 4 0 RB 1 (SUTURE)
SUT PROLENE 4 0 SH DA (SUTURE) IMPLANT
SUT PROLENE 4-0 RB1 .5 CRCL 36 (SUTURE) IMPLANT
SUT PROLENE 5 0 C 1 36 (SUTURE) IMPLANT
SUT PROLENE 6 0 C 1 30 (SUTURE) ×3 IMPLANT
SUT PROLENE 7 0 BV 1 (SUTURE) IMPLANT
SUT PROLENE 7 0 BV1 MDA (SUTURE) ×6 IMPLANT
SUT PROLENE 8 0 BV175 6 (SUTURE) IMPLANT
SUT SILK  1 MH (SUTURE)
SUT SILK 1 MH (SUTURE) IMPLANT
SUT STEEL STERNAL CCS#1 18IN (SUTURE) IMPLANT
SUT STEEL SZ 6 DBL 3X14 BALL (SUTURE) ×9 IMPLANT
SUT VIC AB 1 CTX 36 (SUTURE) ×6
SUT VIC AB 1 CTX36XBRD ANBCTR (SUTURE) ×4 IMPLANT
SUT VIC AB 2-0 CT1 27 (SUTURE) ×3
SUT VIC AB 2-0 CT1 TAPERPNT 27 (SUTURE) ×2 IMPLANT
SUT VIC AB 2-0 CTX 27 (SUTURE) IMPLANT
SUT VIC AB 3-0 SH 27 (SUTURE)
SUT VIC AB 3-0 SH 27X BRD (SUTURE) IMPLANT
SUT VIC AB 3-0 X1 27 (SUTURE) IMPLANT
SUT VICRYL 4-0 PS2 18IN ABS (SUTURE) IMPLANT
SUTURE E-PAK OPEN HEART (SUTURE) ×3 IMPLANT
SYSTEM SAHARA CHEST DRAIN ATS (WOUND CARE) ×3 IMPLANT
TAPE CLOTH SURG 4X10 WHT LF (GAUZE/BANDAGES/DRESSINGS) ×3 IMPLANT
TOWEL OR 17X24 6PK STRL BLUE (TOWEL DISPOSABLE) ×3 IMPLANT
TOWEL OR 17X26 10 PK STRL BLUE (TOWEL DISPOSABLE) ×3 IMPLANT
TRAY FOLEY IC TEMP SENS 16FR (CATHETERS) ×3 IMPLANT
TUBING INSUFFLATION (TUBING) ×3 IMPLANT
UNDERPAD 30X30 INCONTINENT (UNDERPADS AND DIAPERS) ×3 IMPLANT
WATER STERILE IRR 1000ML POUR (IV SOLUTION) ×6 IMPLANT

## 2015-06-04 NOTE — Progress Notes (Signed)
Pre-op Cardiac Surgery  Carotid Findings:  Bilateral:  1-39% ICA stenosis.  Vertebral artery flow is antegrade.      Upper Extremity Right Left  Brachial Pressures 148 152  Radial Waveforms Tri Tri  Ulnar Waveforms Tri Tri  Palmar Arch (Allen's Test) Normal with radial compression, obliterates with ulnar compression Normal with radial compression, obliterates with ulnar compression       Lower  Extremity Right Left  Dorsalis Pedis    Anterior Tibial 167, Tri 161, Bi  Posterior Tibial 172, Bi 162, Bi  Ankle/Brachial Indices 1.13 1.06    Farrel Demark, RDMS, RVT 06/04/2015

## 2015-06-04 NOTE — Progress Notes (Addendum)
ANTICOAGULATION CONSULT NOTE- Follow up  Pharmacy Consult for Heparin Indication: chest pain/ACS  No Known Allergies  Patient Measurements: Height: 4\' 11"  (149.9 cm) Weight: 165 lb 14.4 oz (75.252 kg) IBW/kg (Calculated) : 43.2  Heparin Dosing Weight: 60.1 kg  Vital Signs: Temp: 98 F (36.7 C) (08/01 0730) Temp Source: Oral (08/01 0730) BP: 121/56 mmHg (08/01 0730) Pulse Rate: 73 (08/01 0730)  Labs:  Recent Labs  06/02/15 0254  06/02/15 1503 06/03/15 0337 06/03/15 2009 06/04/15 0400  HGB 9.9*  --   --  10.0*  --  10.4*  HCT 31.5*  --   --  31.3*  --  32.1*  PLT 189  --   --  178  --  185  APTT  --   --   --   --  73*  --   LABPROT  --   --   --   --  13.8  --   INR  --   --   --   --  1.04  --   HEPARINUNFRC  --   < > 0.50 0.40  --  0.47  CREATININE 0.95  --   --   --   --  0.89  < > = values in this interval not displayed.  Estimated Creatinine Clearance: 53.5 mL/min (by C-G formula based on Cr of 0.89).  Assessment: 69 YOF on heparin for NSTEMI. S/p cath on 7/29. CABG rescheduled to today 06/04/15 for severe 3V disease. She continues on IV heparin since being restarted after TR band removal for upcoming CABG. Heparin level 0.47 today, remains therapeutic on IV heparin drip 1150 units/hr. CBC stable. No bleeding noted.    Goal of Therapy:  Heparin level 0.3-0.7 units/ml Monitor platelets by anticoagulation protocol: Yes    Plan:  - Continue heparin gtt at 1150 units/hr - Continue daily heparin level and CBC - F/u after CABG (@14 :00 today per Dr. 08/04/15)   , RPh Clinical Pharmacist Pager: [phone] 06/04/2015 10:10 AM

## 2015-06-04 NOTE — Progress Notes (Signed)
Changes made per Rapid Wean Protocol

## 2015-06-04 NOTE — Progress Notes (Signed)
Changes made per Rapid wean protocol

## 2015-06-04 NOTE — Care Management Note (Signed)
Case Management Note CM noted started by Tomi Bamberger RNCM  Patient Details  Name: [name] MRN: [id number] Date of Birth: 1946/05/26  Subjective/Objective:   Pt admitted for cp.                  Action/Plan: CM to monitor for disposition needs.    Expected Discharge Date:                  Expected Discharge Plan:  Home/Self Care  In-House Referral:     Discharge planning Services  CM Consult  Post Acute Care Choice:    Choice offered to:     DME Arranged:    DME Agency:     HH Arranged:    HH Agency:     Status of Service:  In process, will continue to follow  Medicare Important Message Given:    Date Medicare IM Given:    Medicare IM give by:    Date Additional Medicare IM Given:    Additional Medicare Important Message give by:     If discussed at Long Length of Stay Meetings, dates discussed:   06/05/15  Additional Comments:  06/04/15- pt for CABG today- CM to follow post op for d/c needs  Darrold Span, RN 06/04/2015, 10:59 AM

## 2015-06-04 NOTE — Anesthesia Preprocedure Evaluation (Addendum)
Anesthesia Evaluation  Patient identified by MRN, date of birth, ID band Patient awake    Reviewed: Allergy & Precautions, NPO status , Patient's Chart, lab work & pertinent test results  Airway Mallampati: II  TM Distance: >3 FB Neck ROM: full    Dental  (+) Teeth Intact, Dental Advisory Given   Pulmonary shortness of breath, asthma , COPDformer smoker,  Pulmonary fibrosis. breath sounds clear to auscultation  Pulmonary exam normal       Cardiovascular hypertension, Pt. on medications + CAD and + Past MI Normal cardiovascular examRhythm:regular Rate:Normal     Neuro/Psych    GI/Hepatic GERD-  Medicated and Controlled,  Endo/Other  Hypothyroidism obese  Renal/GU      Musculoskeletal  (+) Arthritis -,   Abdominal   Peds  Hematology  (+) anemia ,   Anesthesia Other Findings   Reproductive/Obstetrics                           Anesthesia Physical Anesthesia Plan  ASA: III  Anesthesia Plan: General   Post-op Pain Management:    Induction: Intravenous  Airway Management Planned: Oral ETT  Additional Equipment: Arterial line, CVP, PA Cath, TEE and Ultrasound Guidance Line Placement  Intra-op Plan:   Post-operative Plan: Post-operative intubation/ventilation  Informed Consent: I have reviewed the patients History and Physical, chart, labs and discussed the procedure including the risks, benefits and alternatives for the proposed anesthesia with the patient or authorized representative who has indicated his/her understanding and acceptance.     Plan Discussed with: CRNA and Surgeon  Anesthesia Plan Comments:        Anesthesia Quick Evaluation

## 2015-06-04 NOTE — Progress Notes (Signed)
Patient ID: [name], female   DOB: Jun 27, 1946, 69 y.o.   MRN: [id number]  Chart and cath films reviewed, patient examined. She has severe multivessel coronary disease with unstable angina. Plan CABG today. She is having PFT's and carotid dopplers this am. I discussed the operative procedure with the patient including alternatives, benefits and risks; including but not limited to bleeding, blood transfusion, infection, stroke, myocardial infarction, graft failure, heart block requiring a permanent pacemaker, organ dysfunction, and death.  [name] understands and agrees to proceed.

## 2015-06-04 NOTE — Brief Op Note (Signed)
05/31/2015 - 06/04/2015  4:45 PM  PATIENT:  [name]  69 y.o. female  PRE-OPERATIVE DIAGNOSIS:  CAD  POST-OPERATIVE DIAGNOSIS:  coronary artery disease  PROCEDURE:  Procedure(s):  CORONARY ARTERY BYPASS GRAFT x 3 -LIMA to LAD -SVG to OM -SVG to DISTAL RCA  ENDOSCOPIC HARVEST GREATER SAPHENOUS VEIN  -Right Leg  TRANSESOPHAGEAL ECHOCARDIOGRAM (TEE)  SURGEON:  Surgeon(s) and Role:    * Alleen Borne, MD - Primary  PHYSICIAN ASSISTANT: Lowella Dandy PA-C   ANESTHESIA:   general  EBL:  Total I/O In: 1000 [I.V.:1000] Out: -   BLOOD ADMINISTERED:1U PRBC and  CELLSAVER  DRAINS: Mediastinal Chest Drains, Left Pleural Chest Tubes   LOCAL MEDICATIONS USED:  NONE  SPECIMEN:  No Specimen  DISPOSITION OF SPECIMEN:  N/A  COUNTS:  YES  TOURNIQUET:  * No tourniquets in log *  DICTATION: .Dragon Dictation  PLAN OF CARE: Admit to inpatient   PATIENT DISPOSITION:  ICU - intubated and hemodynamically stable.   Delay start of Pharmacological VTE agent (>24hrs) due to surgical blood loss or risk of bleeding: yes

## 2015-06-04 NOTE — Transfer of Care (Addendum)
Immediate Anesthesia Transfer of Care Note  Patient: [name]  Procedure(s) Performed: Procedure(s): CORONARY ARTERY BYPASS GRAFT times three            with left internal mammary artery and right leg saphenous vein (N/A) TRANSESOPHAGEAL ECHOCARDIOGRAM (TEE)  Patient Location: SICU  Anesthesia Type:General  Level of Consciousness: sedated and Patient remains intubated per anesthesia plan  Airway & Oxygen Therapy: Patient remains intubated per anesthesia plan and Patient placed on Ventilator (see vital sign flow sheet for setting)  Post-op Assessment: Report given to RN and Post -op Vital signs reviewed and stable  Post vital signs: Reviewed and stable  Last Vitals:  Filed Vitals:   06/04/15 0730  BP: 121/56  Pulse: 73  Temp: 36.7 C  Resp: 19    Complications: No apparent anesthesia complications   Pt tx from OR to ICU with standard monitors (HR, BP, SPO2). Emergency drugs and equipment available. Report given to ICU RN and all questions answered. Airway intact.    Minus Liberty CRNA

## 2015-06-04 NOTE — Progress Notes (Addendum)
Surgical and Blood Consents obtained. Pt shaved and prepped as ordered. Hibiclens bath administered. Instructed pt on Coughing and deep breathing and how to use her Insentive Spirometry. Pt and daughter watched Preparing for Open Heart Surgery and Recovering  From Open Heart Surgery. Questions answered.

## 2015-06-04 NOTE — Anesthesia Procedure Notes (Signed)
Procedure Name: Intubation Date/Time: 06/04/2015 3:01 PM Performed by: Daiva Eves Pre-anesthesia Checklist: Patient identified, Timeout performed, Emergency Drugs available, Suction available and Patient being monitored Patient Re-evaluated:Patient Re-evaluated prior to inductionOxygen Delivery Method: Circle system utilized Preoxygenation: Pre-oxygenation with 100% oxygen Intubation Type: IV induction Ventilation: Mask ventilation without difficulty Laryngoscope Size: Mac and 3 Grade View: Grade II Tube size: 8.0 mm Number of attempts: 1 Airway Equipment and Method: Stylet Placement Confirmation: ETT inserted through vocal cords under direct vision,  positive ETCO2,  CO2 detector and breath sounds checked- equal and bilateral Secured at: 22 cm Tube secured with: Tape Dental Injury: Teeth and Oropharynx as per pre-operative assessment

## 2015-06-04 NOTE — Progress Notes (Signed)
CARDIAC REHAB PHASE I  Second attempt to complete pre-op education with pt. Pt was in PFTs/vascular lab earlier this morning, RN now states pt will be going to OR shortly. Spoke with pt briefly, assured her we will follow-up after surgery.  Pt verbalized understanding.   Joylene Grapes, RN, BSN 06/04/2015 11:16 AM

## 2015-06-04 NOTE — Procedures (Signed)
Extubation Procedure Note  Patient Details:   Name: [name] DOB: 06-28-1946 MRN: [id number]   Airway Documentation:  Airway 8 mm (Active)  Secured at (cm) 22 cm 06/04/2015  7:47 PM  Measured From Lips 06/04/2015  7:47 PM  Secured Location Right 06/04/2015  7:47 PM  Secured By Caron Presume Tape 06/04/2015  7:47 PM  Cuff Pressure (cm H2O) 25 cm H2O 06/04/2015  7:47 PM  Site Condition Dry 06/04/2015  6:37 PM    Evaluation  O2 sats: stable throughout Complications: No apparent complications Patient did tolerate procedure well. Bilateral Breath Sounds: Clear, Diminished   Yes, pt able to cough to clear secretions and speak name. Placed pt on nasal cannula and is tolerating well at this time. Pt's NIF was -20 and VC 5L.   Tacy Learn 06/04/2015, 10:43 PM

## 2015-06-04 NOTE — Care Management Important Message (Signed)
Important Message  Patient Details  Name: [name] MRN: [id number] Date of Birth: Mar 13, 1946   Medicare Important Message Given:  Yes-second notification given    [name] 06/04/2015, 12:35 PM

## 2015-06-04 NOTE — Progress Notes (Signed)
  Echocardiogram Echocardiogram Transesophageal has been performed.  Cathie Beams 06/04/2015, 3:18 PM

## 2015-06-04 NOTE — Progress Notes (Addendum)
Late entry: assumed care from off going RN @ [phone]; pt. Resting at that time; no c/o's pain;   @0830  pt. Went down for doppler & PFT's; @1112  heparin drip STOPPED; family @ bedside  @1120  pt. Being transported down to OR  @1145  report given to Tibes, 

## 2015-06-05 ENCOUNTER — Other Ambulatory Visit (HOSPITAL_COMMUNITY): Payer: Medicare Other | Admitting: Respiratory Therapy

## 2015-06-05 ENCOUNTER — Inpatient Hospital Stay (HOSPITAL_COMMUNITY): Payer: Medicare Other

## 2015-06-05 ENCOUNTER — Encounter (HOSPITAL_COMMUNITY): Payer: Self-pay | Admitting: Surgery

## 2015-06-05 LAB — CBC
HCT: 31.7 % — ABNORMAL LOW (ref 36.0–46.0)
HCT: 28.9 % — ABNORMAL LOW (ref 36.0–46.0)
Hemoglobin: 10.5 g/dL — ABNORMAL LOW (ref 12.0–15.0)
Hemoglobin: 9.3 g/dL — ABNORMAL LOW (ref 12.0–15.0)
MCH: 29.6 pg (ref 26.0–34.0)
MCH: 28.8 pg (ref 26.0–34.0)
MCHC: 33.1 g/dL (ref 30.0–36.0)
MCHC: 32.2 g/dL (ref 30.0–36.0)
MCV: 89.3 fL (ref 78.0–100.0)
MCV: 89.5 fL (ref 78.0–100.0)
Platelets: 144 10*3/uL — ABNORMAL LOW (ref 150–400)
Platelets: 141 10*3/uL — ABNORMAL LOW (ref 150–400)
RBC: 3.55 MIL/uL — ABNORMAL LOW (ref 3.87–5.11)
RBC: 3.23 MIL/uL — ABNORMAL LOW (ref 3.87–5.11)
RDW: 15.2 % (ref 11.5–15.5)
RDW: 15.5 % (ref 11.5–15.5)
WBC: 12 10*3/uL — ABNORMAL HIGH (ref 4.0–10.5)
WBC: 13.2 10*3/uL — ABNORMAL HIGH (ref 4.0–10.5)

## 2015-06-05 LAB — POCT I-STAT 3, ART BLOOD GAS (G3+)
Acid-base deficit: 2 mmol/L (ref 0.0–2.0)
Bicarbonate: 25.5 mEq/L — ABNORMAL HIGH (ref 20.0–24.0)
O2 Saturation: 98 %
Patient temperature: 36.7
TCO2: 27 mmol/L (ref 0–100)
pCO2 arterial: 55.6 mmHg — ABNORMAL HIGH (ref 35.0–45.0)
pH, Arterial: 7.268 — ABNORMAL LOW (ref 7.350–7.450)
pO2, Arterial: 122 mmHg — ABNORMAL HIGH (ref 80.0–100.0)

## 2015-06-05 LAB — CREATININE, SERUM
Creatinine, Ser: 1.04 mg/dL — ABNORMAL HIGH (ref 0.44–1.00)
GFR calc Af Amer: >60 mL/min (ref >60)
GFR calc non Af Amer: 54 mL/min — ABNORMAL LOW (ref >60)

## 2015-06-05 LAB — MAGNESIUM
Magnesium: 2.7 mg/dL — ABNORMAL HIGH (ref 1.7–2.4)
Magnesium: 2 mg/dL (ref 1.7–2.4)

## 2015-06-05 LAB — GLUCOSE, CAPILLARY
Glucose-Capillary: 109 mg/dL — ABNORMAL HIGH (ref 65–99)
Glucose-Capillary: 114 mg/dL — ABNORMAL HIGH (ref 65–99)
Glucose-Capillary: 116 mg/dL — ABNORMAL HIGH (ref 65–99)
Glucose-Capillary: 120 mg/dL — ABNORMAL HIGH (ref 65–99)
Glucose-Capillary: 128 mg/dL — ABNORMAL HIGH (ref 65–99)
Glucose-Capillary: 141 mg/dL — ABNORMAL HIGH (ref 65–99)

## 2015-06-05 LAB — BASIC METABOLIC PANEL
Anion gap: 6 (ref 5–15)
BUN: 11 mg/dL (ref 6–20)
CO2: 26 mmol/L (ref 22–32)
Calcium: 8.6 mg/dL — ABNORMAL LOW (ref 8.9–10.3)
Chloride: 105 mmol/L (ref 101–111)
Creatinine, Ser: 0.85 mg/dL (ref 0.44–1.00)
GFR calc Af Amer: >60 mL/min (ref >60)
GFR calc non Af Amer: >60 mL/min (ref >60)
Glucose, Bld: 181 mg/dL — ABNORMAL HIGH (ref 65–99)
Potassium: 4.1 mmol/L (ref 3.5–5.1)
Sodium: 137 mmol/L (ref 135–145)

## 2015-06-05 LAB — POCT I-STAT, CHEM 8
BUN: 15 mg/dL (ref 6–20)
Calcium, Ion: 1.28 mmol/L (ref 1.13–1.30)
Chloride: 98 mmol/L — ABNORMAL LOW (ref 101–111)
Creatinine, Ser: 1.1 mg/dL — ABNORMAL HIGH (ref 0.44–1.00)
Glucose, Bld: 112 mg/dL — ABNORMAL HIGH (ref 65–99)
HCT: 28 % — ABNORMAL LOW (ref 36.0–46.0)
Hemoglobin: 9.5 g/dL — ABNORMAL LOW (ref 12.0–15.0)
Potassium: 4.3 mmol/L (ref 3.5–5.1)
Sodium: 136 mmol/L (ref 135–145)
TCO2: 25 mmol/L (ref 0–100)

## 2015-06-05 LAB — HEMOGLOBIN AND HEMATOCRIT, BLOOD
HCT: 24.2 % — ABNORMAL LOW (ref 36.0–46.0)
Hemoglobin: 8 g/dL — ABNORMAL LOW (ref 12.0–15.0)

## 2015-06-05 MED ORDER — ENOXAPARIN SODIUM 40 MG/0.4ML ~~LOC~~ SOLN
40.0000 mg | Freq: Every day | SUBCUTANEOUS | Status: DC
  Administered 2015-06-05 – 2015-06-07 (×3): 40 mg via SUBCUTANEOUS
  Filled 2015-06-05 (×4): qty 0.4

## 2015-06-05 MED ORDER — FUROSEMIDE 10 MG/ML IJ SOLN
40.0000 mg | Freq: Once | INTRAMUSCULAR | Status: AC
  Administered 2015-06-05: 40 mg via INTRAVENOUS
  Filled 2015-06-05: qty 4

## 2015-06-05 MED ORDER — INSULIN ASPART 100 UNIT/ML ~~LOC~~ SOLN: 0.0000 [IU] | SUBCUTANEOUS | Status: DC

## 2015-06-05 MED ORDER — METOCLOPRAMIDE HCL 5 MG/ML IJ SOLN
10.0000 mg | Freq: Four times a day (QID) | INTRAMUSCULAR | Status: DC
  Administered 2015-06-05 – 2015-06-06 (×3): 10 mg via INTRAVENOUS
  Filled 2015-06-05 (×4): qty 2

## 2015-06-05 MED ORDER — POTASSIUM CHLORIDE 10 MEQ/50ML IV SOLN
10.0000 meq | INTRAVENOUS | Status: AC
  Administered 2015-06-05 (×3): 10 meq via INTRAVENOUS
  Filled 2015-06-05: qty 50

## 2015-06-05 NOTE — Op Note (Signed)
CARDIOVASCULAR SURGERY OPERATIVE NOTE  06/05/2015  Surgeon:  Alleen Borne, MD  First Assistant: Lowella Dandy,  PA-C   Preoperative Diagnosis:  Severe multi-vessel coronary artery disease   Postoperative Diagnosis:  Same   Procedure:   Median Sternotomy  Extracorporeal circulation 3.   Coronary artery bypass grafting x 3   Left internal mammary graft to the LAD  SVG to OM1  SVG to RCA  4.   Endoscopic vein harvest from the right leg   Anesthesia:  General Endotracheal   Clinical History/Surgical Indication:  [name] is 69 yo woman with a history of tobacco abuse, COPD, pneumonia, ARDS requiring trach (200^), morbid obesity, hypertension, hyperlipidemia, GERD with Barrett's, post inflammatory pulmonary fibrosis, psoriatic and rheumatoid arthritis. She had no prior history of CAD.   She began having a burning sensation in her chest about 3 weeks prior to admission. It would start near her throat and then move down into her chest. She has a history of GERD and thought she was having reflux. She doubled her PPI but it did not help. Last Saturday she was awakened from her sleep by the pain. This was a more severe pain which she describes as a "heaviness." She felt short of breath, she had nausea and diaphoresis. It lasted about 2 hours altogether. On the day of admission, 7/28, she had 2 very severe epsiodes and was taken to the ED by her daughter. Her initial troponin was 1.28. She was given NTG in the ED with good relief. Her ECG did not show ST elevation.  Her troponin eventually rose to 3.47 and then came down. Cath showed severe 3-vessel coronary disease as noted below:   Prox LAD to Mid LAD lesion, 75% stenosed.  Prox Cx lesion, 95% stenosed.  Prox RCA to Mid RCA lesion, 90% stenosed.  The left ventricular systolic function is normal.  It was felt that CABG was the  best treatment for her.  I discussed the operative procedure with the patient and family including alternatives, benefits and risks; including but not limited to bleeding, blood transfusion, infection, stroke, myocardial infarction, graft failure, heart block requiring a permanent pacemaker, organ dysfunction, and death.  [name] understands and agrees to proceed.    Preparation:  The patient was seen in the preoperative holding area and the correct patient, correct operation were confirmed with the patient after reviewing the medical record and catheterization. The consent was signed by me. Preoperative antibiotics were given. A pulmonary arterial line and radial arterial line were placed by the anesthesia team. The patient was taken back to the operating room and positioned supine on the operating room table. After being placed under general endotracheal anesthesia by the anesthesia team a foley catheter was placed. The neck, chest, abdomen, and both legs were prepped with betadine soap and solution and draped in the usual sterile manner. A surgical time-out was taken and the correct patient and operative procedure were confirmed with the nursing and anesthesia staff.   Cardiopulmonary Bypass:  A median sternotomy was performed. The pericardium was opened in the midline. Right ventricular function appeared normal. The ascending aorta was of normal size and had no palpable plaque. There were no contraindications to aortic cannulation or cross-clamping. The patient was fully systemically heparinized and the ACT was maintained > 400 sec. The proximal aortic arch was cannulated with a 20 F aortic cannula for arterial inflow. Venous cannulation was performed via the right atrial appendage using a two-staged venous cannula. An  antegrade cardioplegia/vent cannula was inserted into the mid-ascending aorta. Aortic occlusion was performed with a single cross-clamp. Systemic cooling to 32 degrees  Centigrade and topical cooling of the heart with iced saline were used. Hyperkalemic antegrade cold blood cardioplegia was used to induce diastolic arrest and was then given at about 20 minute intervals throughout the period of arrest to maintain myocardial temperature at or below 10 degrees centigrade. A temperature probe was inserted into the interventricular septum and an insulating pad was placed in the pericardium.   Left internal mammary harvest:  The left side of the sternum was retracted using the Rultract retractor. The left internal mammary artery was harvested as a pedicle graft. All side branches were clipped. It was a medium-sized vessel of good quality with excellent blood flow. It was ligated distally and divided. It was sprayed with topical papaverine solution to prevent vasospasm.   Endoscopic vein harvest:  The right greater saphenous vein was harvested endoscopically through a 2 cm incision medial to the right knee. It was harvested from the upper thigh to below the knee. It was a medium-sized vein of good quality. The side branches were all ligated with 4-0 silk ties.    Coronary arteries:  The coronary arteries were examined.   LAD:  Large vessel with no significant distal disease. Diagonal was small  LCX:  Two large OM branches. Both had proximal calcified plaque but no obstruction there on cath. The both communicated with eachother without obstruction so I decided to graft the OM1.  RCA:  Large vessel with no mid or distal disease.    Grafts:  1. LIMA to the LAD: 2.0 mm. It was sewn end to side using 8-0 prolene continuous suture. 2. SVG to OM1:  2.0 mm. It was sewn end to side using 7-0 prolene continuous suture. 3. SVG to RCA:  2.5 mm. It was sewn end to side using 7-0 prolene continuous suture.   The proximal vein graft anastomoses were performed to the mid-ascending aorta using continuous 6-0 prolene suture. Graft markers were placed around the proximal  anastomoses.   Completion:  The patient was rewarmed to 37 degrees Centigrade. The clamp was removed from the LIMA pedicle and there was rapid warming of the septum and return of ventricular fibrillation. The crossclamp was removed . There was spontaneous return of sinus rhythm. The distal and proximal anastomoses were checked for hemostasis. The position of the grafts was satisfactory. Two temporary epicardial pacing wires were placed on the right atrium and two on the right ventricle. The patient was weaned from CPB without difficulty on no inotropes. Cardiac output was 5 LPM. Heparin was fully reversed with protamine and the aortic and venous cannulas removed. Hemostasis was achieved. Mediastinal and left pleural drainage tubes were placed. The sternum was closed with double #6 stainless steel wires. The fascia was closed with continuous # 1 vicryl suture. The subcutaneous tissue was closed with 2-0 vicryl continuous suture. The skin was closed with 3-0 vicryl subcuticular suture. All sponge, needle, and instrument counts were reported correct at the end of the case. Dry sterile dressings were placed over the incisions and around the chest tubes which were connected to pleurevac suction. The patient was then transported to the surgical intensive care unit in critical but stable condition.

## 2015-06-05 NOTE — Progress Notes (Signed)
Patient Profile: 69 y/o female with h/o HTN and HLD admitted for chest pain and ruled in for NSTEMI. Found to have severe 3V CAD and referred for CABG.   Subjective: No complaints. Doing well. Denies any recurrent CP. Out of bed and in a chair.   Objective: Vital signs in last 24 hours: Temp:  [97.3 F (36.3 C)-98.6 F (37 C)] 98.4 F (36.9 C) (08/02 0830) Pulse Rate:  [71-99] 95 (08/02 0830) Resp:  [11-27] 22 (08/02 0830) BP: (83-138)/(47-80) 130/80 mmHg (08/02 0830) SpO2:  [94 %-100 %] 94 % (08/02 1104) Arterial Line BP: (93-165)/(57-68) 142/58 mmHg (08/02 0800) FiO2 (%):  [40 %-50 %] 40 % (08/01 2200) Weight:  [172 lb 14.4 oz (78.427 kg)] 172 lb 14.4 oz (78.427 kg) (08/02 0500) Last BM Date: 06/03/15  Intake/Output from previous day: 08/01 0701 - 08/02 0700 In: 5501.9 [I.V.:4576.9; Blood:245; NG/GT:30; IV Piggyback:650] Out: 4315 [Urine:2330; Emesis/NG output:100; Blood:1625; Chest Tube:260] Intake/Output this shift: Total I/O In: 18 [I.V.:18] Out: 150 [Urine:100; Chest Tube:50]  Medications Current Facility-Administered Medications  Medication Dose Route Frequency Provider Last Rate Last Dose  . 0.45 % sodium chloride infusion   Intravenous Continuous PRN Erin R Barrett, PA-C 10 mL/hr at 06/05/15 0800    . 0.9 %  sodium chloride infusion  250 mL Intravenous Continuous Erin R Barrett, PA-C 1 mL/hr at 06/05/15 0543 250 mL at 06/05/15 0543  . 0.9 %  sodium chloride infusion   Intravenous Continuous Erin R Barrett, PA-C 20 mL/hr at 06/04/15 2048    . acetaminophen (TYLENOL) tablet 1,000 mg  1,000 mg Oral 4 times per day Rae Roam Barrett, PA-C   1,000 mg at 06/05/15 5176   Or  . acetaminophen (TYLENOL) solution 1,000 mg  1,000 mg Per Tube 4 times per day Erin R Barrett, PA-C      . acetaminophen (TYLENOL) solution 650 mg  650 mg Per Tube Once Erin R Barrett, PA-C       Or  . acetaminophen (TYLENOL) suppository 650 mg  650 mg Rectal Once Erin R Barrett, PA-C      . albumin  human 5 % solution 250 mL  250 mL Intravenous Q15 min PRN Erin R Barrett, PA-C   250 mL at 06/04/15 2109  . antiseptic oral rinse (CPC / CETYLPYRIDINIUM CHLORIDE 0.05%) solution 7 mL  7 mL Mouth Rinse QID Alleen Borne, MD   7 mL at 06/05/15 0400  . aspirin EC tablet 325 mg  325 mg Oral Daily Erin R Barrett, PA-C       Or  . aspirin chewable tablet 324 mg  324 mg Per Tube Daily Erin R Barrett, PA-C   324 mg at 06/05/15 1036  . atorvastatin (LIPITOR) tablet 80 mg  80 mg Oral q1800 Leone Brand, NP   80 mg at 06/03/15 1831  . bisacodyl (DULCOLAX) EC tablet 10 mg  10 mg Oral Daily Erin R Barrett, PA-C   10 mg at 06/05/15 1036   Or  . bisacodyl (DULCOLAX) suppository 10 mg  10 mg Rectal Daily Erin R Barrett, PA-C      . budesonide-formoterol (SYMBICORT) 160-4.5 MCG/ACT inhaler 2 puff  2 puff Inhalation BID Leone Brand, NP   2 puff at 06/05/15 1104  . cefUROXime (ZINACEF) 1.5 g in dextrose 5 % 50 mL IVPB  1.5 g Intravenous Q12H Erin R Barrett, PA-C   1.5 g at 06/05/15 0408  . chlorhexidine (PERIDEX) 0.12 % solution 15 mL  15 mL Mouth Rinse BID Alleen Borne, MD   15 mL at 06/05/15 0757  . citalopram (CELEXA) tablet 40 mg  40 mg Oral Daily Leone Brand, NP   40 mg at 06/05/15 1036  . docusate sodium (COLACE) capsule 200 mg  200 mg Oral Daily Erin R Barrett, PA-C   200 mg at 06/05/15 1036  . enoxaparin (LOVENOX) injection 40 mg  40 mg Subcutaneous QHS Alleen Borne, MD      . famotidine (PEPCID) IVPB 20 mg premix  20 mg Intravenous Q12H Erin R Barrett, PA-C   20 mg at 06/04/15 1928  . insulin aspart (novoLOG) injection 0-24 Units  0-24 Units Subcutaneous 6 times per day Alleen Borne, MD   2 Units at 06/05/15 0459  . lactated ringers infusion   Intravenous Continuous Erin R Barrett, PA-C 10 mL/hr at 06/05/15 0800    . lactated ringers infusion   Intravenous Continuous Erin R Barrett, PA-C   Stopped at 06/05/15 0300  . levothyroxine (SYNTHROID, LEVOTHROID) tablet 150 mcg  150 mcg Oral QAC  breakfast Luis Abed, MD   150 mcg at 06/05/15 0751  . metoprolol (LOPRESSOR) injection 2.5-5 mg  2.5-5 mg Intravenous Q2H PRN Erin R Barrett, PA-C   5 mg at 06/05/15 0646  . metoprolol tartrate (LOPRESSOR) tablet 12.5 mg  12.5 mg Oral BID Erin R Barrett, PA-C   12.5 mg at 06/05/15 1040   Or  . metoprolol tartrate (LOPRESSOR) 25 mg/10 mL oral suspension 12.5 mg  12.5 mg Per Tube BID Erin R Barrett, PA-C   12.5 mg at 06/04/15 2300  . morphine 2 MG/ML injection 2-5 mg  2-5 mg Intravenous Q1H PRN Erin R Barrett, PA-C   2 mg at 06/05/15 0528  . nitroGLYCERIN 50 mg in dextrose 5 % 250 mL (0.2 mg/mL) infusion  0-100 mcg/min Intravenous Titrated Erin R Barrett, PA-C 18 mL/hr at 06/05/15 0800 60 mcg/min at 06/05/15 0800  . ondansetron (ZOFRAN) injection 4 mg  4 mg Intravenous Q6H PRN Erin R Barrett, PA-C      . oxyCODONE (Oxy IR/ROXICODONE) immediate release tablet 5-10 mg  5-10 mg Oral Q3H PRN Erin R Barrett, PA-C      . pantoprazole (PROTONIX) EC tablet 40 mg  40 mg Oral BID Leone Brand, NP   40 mg at 06/05/15 1036  . phenylephrine (NEO-SYNEPHRINE) 20 mg in dextrose 5 % 250 mL (0.08 mg/mL) infusion  0-100 mcg/min Intravenous Titrated Erin R Barrett, PA-C   Stopped at 06/05/15 0200  . pneumococcal 23 valent vaccine (PNU-IMMUNE) injection 0.5 mL  0.5 mL Intramuscular Tomorrow-1000 Laqueta Linden, MD      . potassium chloride 10 mEq in 50 mL *CENTRAL LINE* IVPB  10 mEq Intravenous Q1 Hr x 3 Alleen Borne, MD   10 mEq at 06/05/15 1037  . sodium chloride 0.9 % injection 3 mL  3 mL Intravenous Q12H Erin R Barrett, PA-C      . sodium chloride 0.9 % injection 3 mL  3 mL Intravenous PRN Erin R Barrett, PA-C      . traMADol (ULTRAM) tablet 50-100 mg  50-100 mg Oral Q4H PRN Erin R Barrett, PA-C   100 mg at 06/05/15 1036    PE: General appearance: alert, cooperative and no distress Neck: no carotid bruit and no JVD Lungs: clear to auscultation bilaterally Heart: regular rate and rhythm Extremities:  trace LEE Pulses: 2+ and symmetric Skin: warm and dry Neurologic: Grossly  normal  Lab Results:   Recent Labs  06/04/15 0400  06/04/15 1640  06/04/15 1830 06/04/15 1840 06/05/15 0225  WBC 7.1  --   --   --  11.2*  --  12.0*  HGB 10.4*  < > 8.0*  < > 9.9* 10.9* 10.5*  HCT 32.1*  < > 24.2*  < > 30.2* 32.0* 31.7*  PLT 185  --  113*  --  131*  --  144*  < > = values in this interval not displayed. BMET  Recent Labs  06/04/15 0400  06/04/15 1643 06/04/15 1717 06/04/15 1840 06/05/15 0225  NA 141  < > 135 137 138 137  K 4.4  < > 5.8* 5.2* 4.3 4.1  CL 107  < > 101 102  --  105  CO2 28  --   --   --   --  26  GLUCOSE 104*  < > 124* 128* 139* 181*  BUN 11  < > 10 11  --  11  CREATININE 0.89  < > 0.70 0.60  --  0.85  CALCIUM 9.9  --   --   --   --  8.6*  < > = values in this interval not displayed. PT/INR  Recent Labs  06/03/15 2009 06/04/15 1830  LABPROT 13.8 16.3*  INR 1.04 1.30    Assessment/Plan  Principal Problem:   NSTEMI (non-ST elevated myocardial infarction) Active Problems:   Hypothyroidism   Essential hypertension   PULMONARY FIBROSIS ILD POST INFLAMMATORY CHRONIC   History of home oxygen therapy   GERD (gastroesophageal reflux disease)   Barrett's esophagus   Bradycardia   S/P CABG x 3   1. CAD/ NSTEMI: day 1 s/p CABG x 3.   Left internal mammary graft to the LAD  SVG to OM1  SVG to RCA  Doing well. Out of bed and in a chair. No recurrent CP. NEO-SYNEPHRINE discontinued. HR and BP stable. No post-op arrhthymias noted. NSR on tele. Continue ASA, statin and BB.     LOS: 5 days    Brittainy M. Delmer Islam 06/05/2015 11:38 AM  I have personally seen and examined this patient with Robbie Lis, PA-C.  I agree with the assessment and plan as outlined above. She is doing well POD #1 following CABG x 3v. Hemodynamically stable. She is on ASA, statin, beta blocker. She presented as a NSTEMI so would start Plavix when ok from surgical  standpoint.   MCALHANY,CHRISTOPHER 06/05/2015 12:26 PM

## 2015-06-05 NOTE — Progress Notes (Addendum)
[facility] DAILY ICU PROGRESS NOTE                   [address]            [place] [address]          [phone]   1 Day Post-Op Procedure(s) (LRB): CORONARY ARTERY BYPASS GRAFT times three            with left internal mammary artery and right leg saphenous vein (N/A) TRANSESOPHAGEAL ECHOCARDIOGRAM (TEE)  Total Length of Stay:  LOS: 5 days   Subjective: Awake, alert and conversant.  No complaints this am except soreness.   Objective: Vital signs in last 24 hours: Temp:  [97.3 F (36.3 C)-98.6 F (37 C)] 98.4 F (36.9 C) (08/02 0700) Pulse Rate:  [71-99] 88 (08/02 0700) Cardiac Rhythm:  [-] Normal sinus rhythm (08/02 0600) Resp:  [11-27] 21 (08/02 0700) BP: (83-136)/(47-66) 126/64 mmHg (08/02 0700) SpO2:  [97 %-100 %] 100 % (08/02 0700) Arterial Line BP: (93-164)/(57-68) 131/66 mmHg (08/02 0700) FiO2 (%):  [40 %-50 %] 40 % (08/01 2200) Weight:  [172 lb 14.4 oz (78.427 kg)] 172 lb 14.4 oz (78.427 kg) (08/02 0500)  Filed Weights   06/03/15 0400 06/04/15 0410 06/05/15 0500  Weight: 165 lb 12.8 oz (75.206 kg) 165 lb 14.4 oz (75.252 kg) 172 lb 14.4 oz (78.427 kg)  Preop weight= 75 kg  Weight change: 7 lb (3.175 kg)   Hemodynamic parameters for last 24 hours: PAP: (20-41)/(12-35) 23/12 mmHg CO:  [3 L/min-5.3 L/min] 5.3 L/min CI:  [1.7 L/min/m2-3.1 L/min/m2] 3.1 L/min/m2  Intake/Output from previous day: 08/01 0701 - 08/02 0700 In: 5501.9 [I.V.:4576.9; Blood:245; NG/GT:30; IV Piggyback:650] Out: 4260 [Urine:2275; Emesis/NG output:100; Blood:1625; Chest Tube:260]  CBGs  [id number]    Current Meds: Scheduled Meds: . acetaminophen  1,000 mg Oral 4 times per day   Or  . acetaminophen (TYLENOL) oral liquid 160 mg/5 mL  1,000 mg Per Tube 4 times per day  . acetaminophen (TYLENOL) oral liquid 160 mg/5 mL  650 mg Per Tube Once   Or  . acetaminophen  650 mg Rectal Once  . antiseptic oral rinse  7 mL Mouth Rinse QID  . aspirin EC  325  mg Oral Daily   Or  . aspirin  324 mg Per Tube Daily  . atorvastatin  80 mg Oral q1800  . bisacodyl  10 mg Oral Daily   Or  . bisacodyl  10 mg Rectal Daily  . budesonide-formoterol  2 puff Inhalation BID  . cefUROXime (ZINACEF)  IV  1.5 g Intravenous Q12H  . chlorhexidine  15 mL Mouth Rinse BID  . citalopram  40 mg Oral Daily  . docusate sodium  200 mg Oral Daily  . famotidine (PEPCID) IV  20 mg Intravenous Q12H  . insulin aspart  0-24 Units Subcutaneous 6 times per day  . levothyroxine  150 mcg Oral QAC breakfast  . metoprolol tartrate  12.5 mg Oral BID   Or  . metoprolol tartrate  12.5 mg Per Tube BID  . pantoprazole  40 mg Oral BID  . pneumococcal 23 valent vaccine  0.5 mL Intramuscular Tomorrow-1000  . sodium chloride  3 mL Intravenous Q12H   Continuous Infusions: . sodium chloride 20 mL/hr at 06/05/15 0600  . sodium chloride 250 mL (06/05/15 0543)  . sodium chloride 20 mL/hr at 06/04/15 2048  . dexmedetomidine Stopped (06/04/15 2030)  . lactated ringers 20 mL/hr at 06/05/15 0600  .  lactated ringers Stopped (06/05/15 0300)  . nitroGLYCERIN 60 mcg/min (06/05/15 0700)  . phenylephrine (NEO-SYNEPHRINE) Adult infusion Stopped (06/05/15 0200)   PRN Meds:.sodium chloride, albumin human, metoprolol, midazolam, morphine injection, ondansetron (ZOFRAN) IV, oxyCODONE, sodium chloride, traMADol   Physical Exam: General appearance: alert, cooperative and no distress Heart: regular rate and rhythm Lungs: clear to auscultation bilaterally Abdomen: soft, non-tender; bowel sounds normal; no masses,  no organomegaly Extremities: Mild LE edema Wound: Dressed and dry   Lab Results: CBC: Recent Labs  06/04/15 1830 06/04/15 1840 06/05/15 0225  WBC 11.2*  --  12.0*  HGB 9.9* 10.9* 10.5*  HCT 30.2* 32.0* 31.7*  PLT 131*  --  144*   BMET:  Recent Labs  06/04/15 0400  06/04/15 1717 06/04/15 1840 06/05/15 0225  NA 141  < > 137 138 137  K 4.4  < > 5.2* 4.3 4.1  CL 107  < >  102  --  105  CO2 28  --   --   --  26  GLUCOSE 104*  < > 128* 139* 181*  BUN 11  < > 11  --  11  CREATININE 0.89  < > 0.60  --  0.85  CALCIUM 9.9  --   --   --  8.6*  < > = values in this interval not displayed.  PT/INR:  Recent Labs  06/04/15 1830  LABPROT 16.3*  INR 1.30   Radiology: Dg Chest Port 1 View  06/05/2015   CLINICAL DATA:  CABG.  EXAM: PORTABLE CHEST - 1 VIEW  COMPARISON:  06/04/2015.  FINDINGS: Interim extubation. Swan-Ganz catheter is again noted looped in the left main pulmonary artery with tip possibly in the right ventricle or pulmonary outflow tract. Left chest tube in stable position. Mediastinal drainage catheters in stable position. Heart size stable. Low lung volumes with basilar subsegmental atelectasis. No significant pleural effusion. No pneumothorax.  IMPRESSION: 1. Interim extubation. Swan-Ganz catheter is again noted coiled in the left main pulmonary artery. Its tip may be in the right ventricle or pulmonary outflow tract. Remaining lines and tubes in stable position. No pneumothorax. 2. Prior CABG.  Heart size stable. 3. Low lung volumes with mild bibasilar subsegmental atelectasis .   Electronically Signed   By: Maisie Fus  Register   On: 06/05/2015 07:32   Dg Chest Port 1 View  06/04/2015   CLINICAL DATA:  Bypass surgery.  EXAM: PORTABLE CHEST - 1 VIEW  COMPARISON:  05/31/2015.  FINDINGS: The endotracheal to is 2.7 cm above the carina. The NG tube is coursing into the stomach. The left-sided chest tube is in good position. No definite pneumothorax. The Swan-Ganz catheter tip is looped back on itself in the left pulmonary artery. Mediastinal drain tubes are in place.  Low lung volumes with mild vascular crowding and streaky atelectasis. Suspect mild interstitial pulmonary edema. No large pleural effusions. No pneumothorax.  IMPRESSION: Postoperative support apparatus as discussed above. The Swan-Ganz catheter is looped back on itself in the left pulmonary artery.   Postoperative edema and atelectasis.  No definite pneumothorax.   Electronically Signed   By: Rudie Meyer M.D.   On: 06/04/2015 18:45     Assessment/Plan: S/P Procedure(s) (LRB): CORONARY ARTERY BYPASS GRAFT times three            with left internal mammary artery and right leg saphenous vein (N/A) TRANSESOPHAGEAL ECHOCARDIOGRAM (TEE)  CV- Back on NTG gtt this am, rhythm stable. Will start beta blocker, wean and d/c Ntg as  able.  Pulm- IS/pulm toilet.  Endocrine- off insulin gtt. No h/o DM (A1C=5.8).   Expected postop blood loss anemia- H/H generally stable.  Overall, she looks great. Will d/c lines and tubes as able, routine POD#1 progression.   Ohaver,GINA H 06/05/2015 7:57 AM  Chart reviewed, patient examined, agree with above. She is doing very well. Plan as above.

## 2015-06-05 NOTE — Progress Notes (Signed)
Dr Laneta Simmers paged with pre and post-extubation gas results. Okay with values, no new orders received, Pt alert and breathing comfortably.

## 2015-06-05 NOTE — Care Management Note (Signed)
Case Management Note  Patient Details  Name: [name] MRN: [id number] Date of Birth: 01-Oct-1946  Subjective/Objective:        Prior to admission patient lived at home with husband and was independent.  However husband has suffered a stroke during her hospitalization and the plan is to be admitted to SNF in [place] today.  Patient would like to be discharged to same facility for rehab also.  SW, [place] [phone], is following and working with patient.                 Action/Plan:   Expected Discharge Date:                  Expected Discharge Plan:  Skilled Nursing Facility  In-House Referral:     Discharge planning Services  CM Consult  Post Acute Care Choice:    Choice offered to:     DME Arranged:    DME Agency:     HH Arranged:    HH Agency:     Status of Service:  In process, will continue to follow  Medicare Important Message Given:  Yes-second notification given Date Medicare IM Given:    Medicare IM give by:    Date Additional Medicare IM Given:    Additional Medicare Important Message give by:     If discussed at Long Length of Stay Meetings, dates discussed:    Additional Comments:  Vangie Bicker, RN 06/05/2015, 3:24 PM

## 2015-06-05 NOTE — Progress Notes (Signed)
MDI not found in pt drawer and does not show any dispensing events. Pharmacy Tech here to fill bins but does not have it. Pharmacy notified of missing dose and RT will give it when its received.

## 2015-06-05 NOTE — Progress Notes (Signed)
Utilization review completed.  

## 2015-06-05 NOTE — Evaluation (Signed)
Physical Therapy Evaluation Patient Details Name: [name] MRN: [id number] DOB: [birth year] Today's Date: 06/05/2015   History of Present Illness  Pt with NSTEMi, CAD s/p CABGx3  Clinical Impression  Pt in positive mood, moving well and ambulating very well for first time up. Pt with sats 88-93% on RA with gait  maintaining in the 90s with cues for breathing. Pt educated for sternal precautions, transfers, mobility and gait with good carry over. Pt will benefit from acute therapy to maximize mobility, gait and independence adhering to precautions to decrease burden of care and return pt to PLOF.     Follow Up Recommendations SNF (pt wants to go to sNF with spouse and does not have 24hr supervision for home)    Equipment Recommendations  None recommended by PT    Recommendations for Other Services       Precautions / Restrictions Precautions Precautions: Sternal      Mobility  Bed Mobility Overal bed mobility: Needs Assistance Bed Mobility: Sit to Supine       Sit to supine: Min assist   General bed mobility comments: cues for sequence with assist to elevate legs to surface  Transfers Overall transfer level: Needs assistance   Transfers: Sit to/from Stand Sit to Stand: Min guard         General transfer comment: cues for hand placement, anterior translation and safety  Ambulation/Gait Ambulation/Gait assistance: Supervision Ambulation Distance (Feet): 250 Feet Assistive device: Rolling walker (2 wheeled) Gait Pattern/deviations: Step-through pattern;Decreased stride length   Gait velocity interpretation: Below normal speed for age/gender General Gait Details: cues for position in RW and breathing technique with 2 standing rest breaks to recover sats  Stairs            Wheelchair Mobility    Modified Rankin (Stroke Patients Only)       Balance Overall balance assessment: Needs assistance   Sitting balance-[name]: Good       Standing  balance-[name]: Fair                               Pertinent Vitals/Pain Pain Assessment: 0-10 Pain Score: 3  Pain Location: right neck Pain Descriptors / Indicators: Aching Pain Intervention(s): Repositioned  HR 88-95 sats 88-94 on RA 97% on 2L at rest BP 116/79    Home Living Family/patient expects to be discharged to:: Private residence Living Arrangements: Spouse/significant other   Type of Home: House Home Access: Stairs to enter     Home Layout: One level Home Equipment: Environmental consultant - 2 wheels;Cane - single point;Bedside commode;Tub bench      Prior Function Level of Independence: Independent         Comments: 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     Hand Dominance        Extremity/Trunk Assessment   Upper Extremity Assessment: Overall WFL for tasks assessed           Lower Extremity Assessment: Overall WFL for tasks assessed      Cervical / Trunk Assessment: Normal  Communication   Communication: No difficulties  Cognition Arousal/Alertness: Awake/alert Behavior During Therapy: WFL for tasks assessed/performed Overall Cognitive Status: Within Functional Limits for tasks assessed                      General Comments      Exercises  Assessment/Plan    PT Assessment Patient needs continued PT services  PT Diagnosis Difficulty walking;Acute pain   PT Problem List Decreased strength;Decreased activity tolerance;Decreased balance;Decreased knowledge of use of DME;Decreased knowledge of precautions  PT Treatment Interventions Gait training;DME instruction;Functional mobility training;Stair training;Therapeutic activities;Therapeutic exercise;Patient/family education   PT Goals (Current goals can be found in the Care Plan section) Acute Rehab PT Goals Patient Stated Goal: return home and cook PT Goal Formulation: With patient/family Time For Goal Achievement: 06/19/15 Potential to  Achieve Goals: Good    Frequency Min 3X/week   Barriers to discharge Decreased caregiver support      Co-evaluation               End of Session   Activity Tolerance: Patient tolerated treatment well Patient left: in bed;with call bell/phone within reach;with nursing/sitter in room;with family/visitor present Nurse Communication: Mobility status;Precautions         Time: 8889-1694 PT Time Calculation (min) (ACUTE ONLY): 24 min   Charges:   PT Evaluation $Initial PT Evaluation Tier I: 1 Procedure PT Treatments $Therapeutic Activity: 8-22 mins   PT G CodesDelorse Lek 06/05/2015, 1:46 PM  Delaney Meigs, PT [phone]

## 2015-06-05 NOTE — Anesthesia Postprocedure Evaluation (Signed)
Anesthesia Post Note  Patient: [name]  Procedure(s) Performed: Procedure(s) (LRB): CORONARY ARTERY BYPASS GRAFT times three            with left internal mammary artery and right leg saphenous vein (N/A) TRANSESOPHAGEAL ECHOCARDIOGRAM (TEE)  Anesthesia type: General  Patient location: ICU  Post pain: Pain level controlled  Post assessment: Post-op Vital signs reviewed  Last Vitals:  Filed Vitals:   06/05/15 1200  BP:   Pulse:   Temp: 37.4 C  Resp:     Post vital signs: stable  Level of consciousness: Patient remains intubated per anesthesia plan  Complications: No apparent anesthesia complications

## 2015-06-05 NOTE — Progress Notes (Signed)
CT surgery p.m. Rounds Patient examined and record reviewed.Hemodynamics stable,labs satisfactory.Patient had stable day.Continue current care. Lidia Clavijo Van Trigt III 06/05/2015   

## 2015-06-06 ENCOUNTER — Inpatient Hospital Stay (HOSPITAL_COMMUNITY): Payer: Medicare Other

## 2015-06-06 ENCOUNTER — Other Ambulatory Visit (HOSPITAL_COMMUNITY): Payer: Medicare Other | Admitting: Respiratory Therapy

## 2015-06-06 LAB — CBC
HCT: 28.4 % — ABNORMAL LOW (ref 36.0–46.0)
Hemoglobin: 9.1 g/dL — ABNORMAL LOW (ref 12.0–15.0)
MCH: 28.9 pg (ref 26.0–34.0)
MCHC: 32 g/dL (ref 30.0–36.0)
MCV: 90.2 fL (ref 78.0–100.0)
Platelets: 144 10*3/uL — ABNORMAL LOW (ref 150–400)
RBC: 3.15 MIL/uL — ABNORMAL LOW (ref 3.87–5.11)
RDW: 15.7 % — ABNORMAL HIGH (ref 11.5–15.5)
WBC: 12.7 10*3/uL — ABNORMAL HIGH (ref 4.0–10.5)

## 2015-06-06 LAB — POCT I-STAT, CHEM 8
BUN: 7 mg/dL (ref 6–20)
Calcium, Ion: 0.91 mmol/L — ABNORMAL LOW (ref 1.13–1.30)
Chloride: 114 mmol/L — ABNORMAL HIGH (ref 101–111)
Creatinine, Ser: 0.5 mg/dL (ref 0.44–1.00)
Glucose, Bld: 71 mg/dL (ref 65–99)
HCT: 14 % — ABNORMAL LOW (ref 36.0–46.0)
Hemoglobin: 4.8 g/dL — CL (ref 12.0–15.0)
Potassium: 2.5 mmol/L — CL (ref 3.5–5.1)
Sodium: 146 mmol/L — ABNORMAL HIGH (ref 135–145)
TCO2: 14 mmol/L (ref 0–100)

## 2015-06-06 LAB — GLUCOSE, CAPILLARY
Glucose-Capillary: 107 mg/dL — ABNORMAL HIGH (ref 65–99)
Glucose-Capillary: 113 mg/dL — ABNORMAL HIGH (ref 65–99)

## 2015-06-06 LAB — BASIC METABOLIC PANEL
Anion gap: 6 (ref 5–15)
BUN: 15 mg/dL (ref 6–20)
CO2: 28 mmol/L (ref 22–32)
Calcium: 8.7 mg/dL — ABNORMAL LOW (ref 8.9–10.3)
Chloride: 102 mmol/L (ref 101–111)
Creatinine, Ser: 1.01 mg/dL — ABNORMAL HIGH (ref 0.44–1.00)
GFR calc Af Amer: >60 mL/min (ref >60)
GFR calc non Af Amer: 56 mL/min — ABNORMAL LOW (ref >60)
Glucose, Bld: 122 mg/dL — ABNORMAL HIGH (ref 65–99)
Potassium: 4.3 mmol/L (ref 3.5–5.1)
Sodium: 136 mmol/L (ref 135–145)

## 2015-06-06 MED ORDER — MOVING RIGHT ALONG BOOK
Freq: Once | Status: AC
  Administered 2015-06-06: 09:00:00
  Filled 2015-06-06: qty 1

## 2015-06-06 MED ORDER — ACETAMINOPHEN 325 MG PO TABS: 650.0000 mg | ORAL_TABLET | Freq: Four times a day (QID) | ORAL | Status: DC | PRN

## 2015-06-06 MED ORDER — FUROSEMIDE 40 MG PO TABS
40.0000 mg | ORAL_TABLET | Freq: Every day | ORAL | Status: DC
  Administered 2015-06-07 – 2015-06-08 (×2): 40 mg via ORAL
  Filled 2015-06-06 (×2): qty 1

## 2015-06-06 MED ORDER — SODIUM CHLORIDE 0.9 % IJ SOLN: 3.0000 mL | INTRAMUSCULAR | Status: DC | PRN

## 2015-06-06 MED ORDER — TRAMADOL HCL 50 MG PO TABS: 50.0000 mg | ORAL_TABLET | ORAL | Status: DC | PRN

## 2015-06-06 MED ORDER — POTASSIUM CHLORIDE CRYS ER 20 MEQ PO TBCR
20.0000 meq | EXTENDED_RELEASE_TABLET | Freq: Two times a day (BID) | ORAL | Status: DC
  Administered 2015-06-07 – 2015-06-08 (×3): 20 meq via ORAL
  Filled 2015-06-06 (×4): qty 1

## 2015-06-06 MED ORDER — FAMOTIDINE 20 MG PO TABS
20.0000 mg | ORAL_TABLET | Freq: Two times a day (BID) | ORAL | Status: DC
  Administered 2015-06-06 – 2015-06-08 (×4): 20 mg via ORAL
  Filled 2015-06-06 (×7): qty 1

## 2015-06-06 MED ORDER — OXYCODONE HCL 5 MG PO TABS
5.0000 mg | ORAL_TABLET | ORAL | Status: DC | PRN
  Administered 2015-06-06 – 2015-06-07 (×3): 5 mg via ORAL
  Filled 2015-06-06 (×3): qty 1

## 2015-06-06 MED ORDER — ONDANSETRON HCL 4 MG PO TABS: 4.0000 mg | ORAL_TABLET | Freq: Four times a day (QID) | ORAL | Status: DC | PRN

## 2015-06-06 MED ORDER — SODIUM CHLORIDE 0.9 % IJ SOLN
3.0000 mL | Freq: Two times a day (BID) | INTRAMUSCULAR | Status: DC
  Administered 2015-06-06 – 2015-06-07 (×3): 3 mL via INTRAVENOUS

## 2015-06-06 MED ORDER — SODIUM CHLORIDE 0.9 % IV SOLN: 250.0000 mL | INTRAVENOUS | Status: DC | PRN

## 2015-06-06 MED ORDER — BISACODYL 5 MG PO TBEC: 10.0000 mg | DELAYED_RELEASE_TABLET | Freq: Every day | ORAL | Status: DC | PRN

## 2015-06-06 MED ORDER — ONDANSETRON HCL 4 MG/2ML IJ SOLN: 4.0000 mg | Freq: Four times a day (QID) | INTRAMUSCULAR | Status: DC | PRN

## 2015-06-06 MED ORDER — FUROSEMIDE 10 MG/ML IJ SOLN
40.0000 mg | Freq: Once | INTRAMUSCULAR | Status: AC
  Administered 2015-06-06: 40 mg via INTRAVENOUS
  Filled 2015-06-06: qty 4

## 2015-06-06 MED ORDER — DOCUSATE SODIUM 100 MG PO CAPS
200.0000 mg | ORAL_CAPSULE | Freq: Every day | ORAL | Status: DC
  Administered 2015-06-06 – 2015-06-08 (×3): 200 mg via ORAL
  Filled 2015-06-06 (×3): qty 2

## 2015-06-06 MED ORDER — METOPROLOL TARTRATE 25 MG PO TABS
25.0000 mg | ORAL_TABLET | Freq: Two times a day (BID) | ORAL | Status: DC
  Administered 2015-06-06 (×2): 25 mg via ORAL
  Filled 2015-06-06 (×4): qty 1

## 2015-06-06 MED ORDER — ASPIRIN EC 325 MG PO TBEC
325.0000 mg | DELAYED_RELEASE_TABLET | Freq: Every day | ORAL | Status: DC
  Administered 2015-06-06 – 2015-06-08 (×3): 325 mg via ORAL
  Filled 2015-06-06 (×3): qty 1

## 2015-06-06 MED ORDER — BISACODYL 10 MG RE SUPP: 10.0000 mg | Freq: Every day | RECTAL | Status: DC | PRN

## 2015-06-06 MED ORDER — POTASSIUM CHLORIDE CRYS ER 20 MEQ PO TBCR
20.0000 meq | EXTENDED_RELEASE_TABLET | Freq: Once | ORAL | Status: AC
  Administered 2015-06-06: 20 meq via ORAL
  Filled 2015-06-06: qty 1

## 2015-06-06 MED FILL — Lidocaine HCl IV Inj 20 MG/ML: INTRAVENOUS | Qty: 5 | Status: AC

## 2015-06-06 MED FILL — Magnesium Sulfate Inj 50%: INTRAMUSCULAR | Qty: 10 | Status: AC

## 2015-06-06 MED FILL — Sodium Chloride IV Soln 0.9%: INTRAVENOUS | Qty: 2000 | Status: AC

## 2015-06-06 MED FILL — Heparin Sodium (Porcine) Inj 1000 Unit/ML: INTRAMUSCULAR | Qty: 30 | Status: AC

## 2015-06-06 MED FILL — Mannitol IV Soln 20%: INTRAVENOUS | Qty: 500 | Status: AC

## 2015-06-06 MED FILL — Heparin Sodium (Porcine) Inj 1000 Unit/ML: INTRAMUSCULAR | Qty: 10 | Status: AC

## 2015-06-06 MED FILL — Electrolyte-R (PH 7.4) Solution: INTRAVENOUS | Qty: 3000 | Status: AC

## 2015-06-06 MED FILL — Sodium Bicarbonate IV Soln 8.4%: INTRAVENOUS | Qty: 50 | Status: AC

## 2015-06-06 MED FILL — Potassium Chloride Inj 2 mEq/ML: INTRAVENOUS | Qty: 40 | Status: AC

## 2015-06-06 NOTE — Progress Notes (Addendum)
1410 Observed pt up with therapist in hall way walking. Third walk for today. We will follow up tomorrow.Luetta Nutting RN BSN 06/06/2015 2:14 PM

## 2015-06-06 NOTE — Clinical Social Work Note (Signed)
CSW was informed by other CSW Poonum Ambelal, that she will continue to follow patient.  Covering CSW stated there are no other current needs by patient, this CSW to sign off.  Ervin Knack. Jacia Sickman, MSW, Theresia Majors [phone] 06/05/2015 5:45pm

## 2015-06-06 NOTE — Progress Notes (Signed)
2 Days Post-Op Procedure(s) (LRB): CORONARY ARTERY BYPASS GRAFT times three            with left internal mammary artery and right leg saphenous vein (N/A) TRANSESOPHAGEAL ECHOCARDIOGRAM (TEE) Subjective:  No complaints  Objective: Vital signs in last 24 hours: Temp:  [98.1 F (36.7 C)-99.6 F (37.6 C)] 98.1 F (36.7 C) (08/03 0417) Pulse Rate:  [86-108] 100 (08/03 0700) Cardiac Rhythm:  [-] Heart block (08/03 0500) Resp:  [14-26] 26 (08/03 0700) BP: (97-143)/(39-86) 143/64 mmHg (08/03 0700) SpO2:  [93 %-100 %] 97 % (08/03 0700) Arterial Line BP: (142-165)/(58-68) 142/58 mmHg (08/02 0800) Weight:  [78.518 kg (173 lb 1.6 oz)] 78.518 kg (173 lb 1.6 oz) (08/03 0600)  Hemodynamic parameters for last 24 hours: PAP: (25-35)/(9-19) 35/19 mmHg CO:  [6.7 L/min] 6.7 L/min CI:  [3.9 L/min/m2] 3.9 L/min/m2  Intake/Output from previous day: 08/02 0701 - 08/03 0700 In: 666 [P.O.:120; I.V.:296; IV Piggyback:250] Out: 2330 [Urine:2280; Chest Tube:50] Intake/Output this shift:    General appearance: alert and cooperative Neurologic: intact Heart: regular rate and rhythm, S1, S2 normal, no murmur, click, rub or gallop Lungs: clear to auscultation bilaterally Extremities: extremities normal, atraumatic, no cyanosis or edema Wound: dressing dry  Lab Results:  Recent Labs  06/05/15 1700 06/05/15 1715 06/06/15 0450  WBC 13.2*  --  12.7*  HGB 9.3* 9.5* 9.1*  HCT 28.9* 28.0* 28.4*  PLT 141*  --  144*   BMET:  Recent Labs  06/05/15 0225  06/05/15 1715 06/06/15 0450  NA 137  < > 136 136  K 4.1  < > 4.3 4.3  CL 105  < > 98* 102  CO2 26  --   --  28  GLUCOSE 181*  < > 112* 122*  BUN 11  < > 15 15  CREATININE 0.85  < > 1.10* 1.01*  CALCIUM 8.6*  --   --  8.7*  < > = values in this interval not displayed.  PT/INR:  Recent Labs  06/04/15 1830  LABPROT 16.3*  INR 1.30   ABG    Component Value Date/Time   PHART 7.268* 06/05/2015 0119   HCO3 25.5* 06/05/2015 0119   TCO2  25 06/05/2015 1715   ACIDBASEDEF 2.0 06/05/2015 0119   O2SAT 98.0 06/05/2015 0119   CBG (last 3)   Recent Labs  06/05/15 1921 06/05/15 2337 06/06/15 0400  GLUCAP 114* 128* 107*   CXR: ok  Assessment/Plan: S/P Procedure(s) (LRB): CORONARY ARTERY BYPASS GRAFT times three            with left internal mammary artery and right leg saphenous vein (N/A) TRANSESOPHAGEAL ECHOCARDIOGRAM (TEE)  She is hemodynamically stable in sinus rhythm. Mobilize Glucose under good control. Hgb A1c was 5.8 preop Diuresis: wt is still 7-10 lbs over preop Plan for transfer to step-down: see transfer orders Need to work on discharge plan. Her husband is going to SNF after stroke and her kids work so she has no one at home.   LOS: 6 days    Alleen Borne 06/06/2015

## 2015-06-06 NOTE — Progress Notes (Signed)
Report called to RN [facility]. Pt transferred without complications. Transferred to [facility] tele with RN at bedside.  Modena Jansky RN [facility] [place]

## 2015-06-07 ENCOUNTER — Other Ambulatory Visit (HOSPITAL_COMMUNITY): Payer: Self-pay | Admitting: Respiratory Therapy

## 2015-06-07 ENCOUNTER — Encounter (HOSPITAL_COMMUNITY)
Admission: EM | Disposition: A | Discharge status: Skilled Nursing Facility | Payer: Self-pay | Source: Home / Self Care | Attending: Cardiovascular Disease

## 2015-06-07 LAB — TYPE AND SCREEN
ABO/RH(D): O POS
Antibody Screen: NEGATIVE
Unit division: 0
Unit division: 0

## 2015-06-07 SURGERY — CORONARY ARTERY BYPASS GRAFTING (CABG)
Anesthesia: General | Site: Chest

## 2015-06-07 MED ORDER — METOPROLOL TARTRATE 1 MG/ML IV SOLN
5.0000 mg | Freq: Once | INTRAVENOUS | Status: AC
  Administered 2015-06-07: 5 mg via INTRAVENOUS

## 2015-06-07 MED ORDER — METOPROLOL TARTRATE 25 MG PO TABS
37.5000 mg | ORAL_TABLET | Freq: Two times a day (BID) | ORAL | Status: DC
  Administered 2015-06-07 – 2015-06-08 (×3): 37.5 mg via ORAL
  Filled 2015-06-07 (×4): qty 1

## 2015-06-07 MED ORDER — METOPROLOL TARTRATE 1 MG/ML IV SOLN
INTRAVENOUS | Status: AC
  Filled 2015-06-07: qty 5

## 2015-06-07 MED ORDER — AMIODARONE HCL IN DEXTROSE 360-4.14 MG/200ML-% IV SOLN
60.0000 mg/h | INTRAVENOUS | Status: AC
  Administered 2015-06-07 (×2): 60 mg/h via INTRAVENOUS
  Filled 2015-06-07: qty 200

## 2015-06-07 MED ORDER — LOSARTAN POTASSIUM 25 MG PO TABS
25.0000 mg | ORAL_TABLET | Freq: Every day | ORAL | Status: DC
  Administered 2015-06-07 – 2015-06-08 (×2): 25 mg via ORAL
  Filled 2015-06-07 (×2): qty 1

## 2015-06-07 MED ORDER — LACTULOSE 10 GM/15ML PO SOLN
20.0000 g | Freq: Once | ORAL | Status: AC
  Administered 2015-06-07: 20 g via ORAL
  Filled 2015-06-07: qty 30

## 2015-06-07 MED ORDER — AMIODARONE IV BOLUS ONLY 150 MG/100ML
150.0000 mg | Freq: Once | INTRAVENOUS | Status: AC
  Administered 2015-06-07: 150 mg via INTRAVENOUS
  Filled 2015-06-07: qty 100

## 2015-06-07 MED ORDER — AMIODARONE HCL IN DEXTROSE 360-4.14 MG/200ML-% IV SOLN
30.0000 mg/h | INTRAVENOUS | Status: DC
  Filled 2015-06-07 (×3): qty 200

## 2015-06-07 NOTE — Progress Notes (Signed)
Physical Therapy Treatment Patient Details Name: [name] MRN: [id number] DOB: Jan 15, 1946 Today's Date: 06/07/2015    History of Present Illness Pt with NSTEMi s/p CABGx3. Hx of COPD, arthritis, HTN, Bil TKA    PT Comments    Pt is progressing well with gait distance and tolerance with oxygen, however, she does still de sat (based on cardiac rehab notes) on RA during gait.  She, without RW, is also moderately unsteady with a staggering gait pattern.  She continues to be appropriate for SNF placement at discharge for rehab as she has no help at home.  PT will continue to follow acutely.  I would like to add balance exercises next treatment session.   Follow Up Recommendations  SNF (pt wants to go to sNF with spouse and does not have 24hr supervision for home)     Equipment Recommendations  None recommended by PT    Recommendations for Other Services   NA     Precautions / Restrictions Precautions Precautions: Sternal Precaution Comments: reviewed verbally sternal precautions.     Mobility  Bed Mobility Overal bed mobility: Needs Assistance Bed Mobility: Rolling;Sidelying to Sit Rolling: Supervision Sidelying to sit: Min assist       General bed mobility comments: Min assist to help support trunk while pt is hugging sternal pillow.   Transfers Overall transfer level: Needs assistance Equipment used: None Transfers: Sit to/from Stand Sit to Stand: Supervision         General transfer comment: supervision for safety  Ambulation/Gait Ambulation/Gait assistance: Supervision Ambulation Distance (Feet): 300 Feet Assistive device: None Gait Pattern/deviations: Step-through pattern;Staggering left;Staggering right Gait velocity: decreased Gait velocity interpretation: Below normal speed for age/gender General Gait Details: mildly staggering gait pattern, cues for pursed lip breathing (pt is a mouth breather) and 2 L O2 [place] used entire session per pt preference.  O2  sats stayed in the low 90s and HR up to 120 bpm during gait.           Balance Overall balance assessment: Needs assistance Sitting-balance support: Feet supported;No upper extremity supported Sitting balance-[name]: Good     Standing balance support: No upper extremity supported Standing balance-[name]: Good                      Cognition Arousal/Alertness: Awake/alert Behavior During Therapy: WFL for tasks assessed/performed Overall Cognitive Status: Within Functional Limits for tasks assessed                      Exercises General Exercises - Lower Extremity Long Arc Quad: AROM;Both;10 reps;Seated Hip ABduction/ADduction: AROM;Both;10 reps;Seated Hip Flexion/Marching: AROM;Both;10 reps;Seated Toe Raises: AROM;Both;10 reps;Seated Heel Raises: AROM;Both;10 reps;Seated Other Exercises Other Exercises: 5 reps of incintive spirometer, max 650 mL cues for slowing speed of breath        Pertinent Vitals/Pain Pain Assessment: No/denies pain           PT Goals (current goals can now be found in the care plan section) Acute Rehab PT Goals Patient Stated Goal: return home and cook Progress towards PT goals: Progressing toward goals    Frequency  Min 3X/week    PT Plan Current plan remains appropriate       End of Session Equipment Utilized During Treatment: Gait belt;Oxygen Activity Tolerance: Patient limited by fatigue Patient left: in chair;with call bell/phone within reach;with family/visitor present     Time: 2119-4174 PT Time Calculation (min) (ACUTE ONLY): 26 min  Charges:  $  Gait Training: 8-22 mins $Therapeutic Exercise: 8-22 mins                      Tumeka Chimenti B. Masiyah Engen, PT, DPT [phone]   06/07/2015, 4:41 PM

## 2015-06-07 NOTE — Progress Notes (Signed)
  Amiodarone Drug - Drug Interaction Consult Note  Recommendations: Patient not on any oral anticoagulant and patient received scheduled potassium replacement. HR tachy in the 120s, K wnl at 4.3, and Qtc wnl at 453 today. Continue to monitor for bradycardia, hypokalemia, and an increasing Qtc.  Amiodarone is metabolized by the cytochrome P450 system and therefore has the potential to cause many drug interactions. Amiodarone has an average plasma half-life of 50 days (range 20 to 100 days).   There is potential for drug interactions to occur several weeks or months after stopping treatment and the onset of drug interactions may be slow after initiating amiodarone.   [x]  Statins: Increased risk of myopathy. Simvastatin- restrict dose to 20mg  daily. Other statins: counsel patients to report any muscle pain or weakness immediately.  []  Anticoagulants: Amiodarone can increase anticoagulant effect. Consider warfarin dose reduction. Patients should be monitored closely and the dose of anticoagulant altered accordingly, remembering that amiodarone levels take several weeks to stabilize.  []  Antiepileptics: Amiodarone can increase plasma concentration of phenytoin, the dose should be reduced. Note that small changes in phenytoin dose can result in large changes in levels. Monitor patient and counsel on signs of toxicity.  [x]  Beta blockers: increased risk of bradycardia, AV block and myocardial depression. Sotalol - avoid concomitant use.  []   Calcium channel blockers (diltiazem and verapamil): increased risk of bradycardia, AV block and myocardial depression.  []   Cyclosporine: Amiodarone increases levels of cyclosporine. Reduced dose of cyclosporine is recommended.  []  Digoxin dose should be halved when amiodarone is started.  [x]  Diuretics: increased risk of cardiotoxicity if hypokalemia occurs.  []  Oral hypoglycemic agents (glyburide, glipizide, glimepiride): increased risk of hypoglycemia.  Patient's glucose levels should be monitored closely when initiating amiodarone therapy.   []  Drugs that prolong the QT interval:  Torsades de pointes risk may be increased with concurrent use - avoid if possible.  Monitor QTc, also keep magnesium/potassium WNL if concurrent therapy can't be avoided. Antibiotics: e.g. fluoroquinolones, erythromycin. . Antiarrhythmics: e.g. quinidine, procainamide, disopyramide, sotalol. . Antipsychotics: e.g. phenothiazines, haloperidol.  . Lithium, tricyclic antidepressants, and methadone.  Thank  06/07/2015 6:07 PM

## 2015-06-07 NOTE — Care Management Important Message (Signed)
Important Message  Patient Details  Name: [name] MRN: [id number] Date of Birth: 1946-06-26   Medicare Important Message Given:  Yes-third notification given    Kyla Balzarine 06/07/2015, 12:02 PMImportant Message  Patient Details  Name: [name] MRN: [id number] Date of Birth: [birth year]   Medicare Important Message Given:  Yes-third notification given    Kyla Balzarine 06/07/2015, 12:01 PM

## 2015-06-07 NOTE — Progress Notes (Signed)
Occupational Therapy Evaluation Note  Pt presents to OT with the below listed deficits.  Currently, she requires supervision for ADLs.  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  All further OT needs can be addressed at SNF.  Follow up recommendations:  SNF  No recommended DME.     06/06/15 1410  OT Visit Information  Last OT Received On 06/07/15  History of Present Illness Pt with NSTEMi s/p CABGx3. 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  Communication  Communication No difficulties  Pain Assessment  Pain Assessment No/denies pain  Cognition  Arousal/Alertness Awake/alert  Behavior During Therapy [facility] for tasks assessed/performed  Overall Cognitive Status Within Functional Limits for tasks assessed  Upper Extremity Assessment  Upper Extremity Assessment Overall WFL for tasks assessed  Lower Extremity Assessment  Lower Extremity Assessment Defer to PT evaluation  Cervical / Trunk Assessment  Cervical / Trunk Assessment Normal  ADL  Overall ADL's  Needs assistance/impaired  Eating/Feeding Independent  Grooming Wash/dry hands;Wash/dry face;Oral care;Brushing hair;Supervision/safety;Standing  Upper Body Bathing Set up;Sitting  Lower Body Bathing Supervison/ safety;Sit to/from stand  Upper Body Dressing  Set up;Sitting  Lower Body Dressing Supervision/safety;Sit to/from Arboriculturist;Ambulation;Comfort height toilet  Toileting- Clothing Manipulation and Hygiene Supervision/safety;Sit to/from stand  Functional mobility during ADLs Supervision/safety;Rolling walker  General ADL Comments Pt able  to verbalize understanding of sternal precautions, but requires min verbal cues to maintain.  Discussed options for bras that allow her to wear and don her bra for support without compromising sternal precautions.    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 and will benefit from OT to address safety with IADLs   OT - End of Session  Equipment Utilized During Treatment Rolling walker  Activity Tolerance Patient tolerated treatment well  Patient left in chair;with call bell/phone within reach;with family/visitor present  Nurse Communication Mobility status  OT Assessment  OT Therapy Diagnosis  Generalized weakness  OT Recommendation/Assessment All further OT needs can be met in the next venue of care  OT Recommendation  Follow Up Recommendations SNF  OT Equipment None recommended by OT  Individuals Consulted  Consulted and Agree with Results and Recommendations Patient  Acute Rehab OT Goals  Patient Stated Goal return home and cook  OT Goal Formulation All assessment and education complete, DC therapy  OT Time Calculation  OT Start Time (ACUTE ONLY) 1335  OT Stop Time (ACUTE ONLY) 1410  OT Time Calculation (min) 35 min  OT General Charges  $OT Visit 1 Procedure  OT Evaluation  $Initial OT Evaluation Tier I 1 Procedure  OT Treatments  $Self Care/Home Management  8-22 mins  [facility], OTR/L [phone]

## 2015-06-07 NOTE — Progress Notes (Signed)
Family requested call to social worker to verify that insurance approval was OK for patient to transfer to same facility as husband.  CSW Poonum explained that this is NOT an issue.  Insurance approval is not needed and she can go when medically ready.  Will explain to family. Pt resting with call bell within reach.  Will continue to monitor. Thomas Hoff, RN

## 2015-06-07 NOTE — Progress Notes (Signed)
Patient went into atrial flutter 110's-140's.  Called PA [employer].  Orders given and patient 12-lead EKG placed in chart. Metoprolol 5 mg IV given, amiodarone bolus and amiodarone infusion started. Patient converted back to sinus rhythm 90's after amiodarone bolus. Patient educated on atrial fibrillation, beta blockers and amiodarone.  Patient concerned that daughter not be called because is staying the night with patient's husband.  Patient worried that her discharge will be delayed and this is causing her distress.  Patient wants to get to facility where her husband is located and feels it will "kill him" if she does not get there tomorrow. I asked if she wanted to me to call her daughter now that rhythm was back to normal.  She does not want her daughter called. Patient states that her daughter will be back tomorrow morning and she will tell her then. Pt resting with call bell within reach.  Will continue to monitor. Thomas Hoff, RN

## 2015-06-07 NOTE — Progress Notes (Signed)
CARDIAC REHAB PHASE I   PRE:  Rate/Rhythm: 88 sR  BP:  Sitting: 127/76        SaO2: 94 2L, 93 RA  MODE:  Ambulation: 300 ft   POST:  Rate/Rhythm: 92 sR  BP:  Sitting: 145/90         SaO2: 98 2L  Pt ambulated 150 ft on RA, hand held assist, mildly unsteady gait at times, tolerated well. Pt sats on RA ambulating 86-88%, improved to 93-95 with deep breaths. Pt did c/o DOE. Completed an additional 150 ft on 2L O2, stats 98% on 2L O2, pt states she feels much more comfortable ambulating with oxygen and is less short of breath. Pt ambulated 300 ft total. Pt denies CP, dizziness, declined rest stop. Pt did have a small amount of bloody drainage from R leg incision, pt states the nurse just removed the dressing this morning. Sterile gauze dressing applied to cover site, RN notified to assess. Pt to recliner after walk, feet elevated, call bell within reach, son and daughter at bedside. Encouraged IS, ambulation. Pt verbalized understanding. Will follow.   SATURATION QUALIFICATIONS: (This note is used to comply with regulatory documentation for home oxygen)  Patient Saturations on Room Air at Rest = 93%  Patient Saturations on Room Air while Ambulating = 86-88%  Patient Saturations on 2 Liters of oxygen while Ambulating = 98%  Please briefly explain why patient needs home oxygen: Pt requiring oxygen to consistently maintain oxygen saturation with ambulation.   [id number]  Joylene Grapes, RN, BSN 06/07/2015 11:44 AM

## 2015-06-07 NOTE — Discharge Summary (Signed)
[address]       [place] [address]             [phone]              Discharge Summary  Name: [name] DOB: Jan 27, 1946 69 y.o. MRN: [id number]   Admission Date: 05/31/2015 Discharge Date: 06/08/2015   Admitting Diagnosis: Chest pain   Discharge Diagnosis:  Severe 3 vessel coronary artery disease Non-ST elevation myocardial infarction Expected postoperative blood loss anemia   Past Medical History  Diagnosis Date  . Psoriatic arthritis   . Rheumatic disease   . Hypertension   . Hypothyroidism   . Barrett esophagus   . COPD (chronic obstructive pulmonary disease)   . Chronic respiratory failure   . Pulmonary fibrosis   . GERD (gastroesophageal reflux disease)   . Shortness of breath     WITH EXERTION  . History of home oxygen therapy     AT NIGHT WHEN SLEEPING 2 L / MIN NASAL CANNULA  . History of ARDS 2006  . Pneumococcal pneumonia 2006    HOSPITALIZED AND DEVELOPED ARDS  . Arthritis     OA RIGHT KNEE WITH PAIN  . NSTEMI (non-ST elevated myocardial infarction) 05/31/2015  Post op a flutter (converted to sinus rhythm)   Procedures: CORONARY ARTERY BYPASS GRAFTING x 3 - 06/04/2015  Left internal mammary artery to left anterior descending  Saphenous vein graft to obtuse marginal 1  Saphenous vein graft to right coronary artery   Endoscopic greater saphenous vein harvest right leg    HPI:  The patient is a 69 y.o. female with a history of tobacco abuse, COPD, pneumonia, ARDS requiring trach (2006), morbid obesity, hypertension, hyperlipidemia, GERD with Barrett's, post inflammatory pulmonary fibrosis, psoriatic and rheumatoid arthritis. She had no prior history of CAD.   She began having a burning sensation in her chest about 3 weeks prior to admission. It would start near her throat and then move down into her chest. She has a history of GERD and thought she was having reflux. She doubled her PPI but it did not help.  Last Saturday, she was awakened from her sleep by the pain. This was a more severe pain which she describes as a "heaviness." She felt short of breath, she had nausea and diaphoresis. It lasted about 2 hours altogether. On the day of admission, 7/28, she had 2 very severe epsiodes and was taken to the ED by her daughter. Her initial troponin was 1.28. She was given NTG in the ED with good relief. Her ECG did not show ST elevation. She was admitted for further workup.    Hospital Course:  The patient was admitted to [facility] on 05/31/2015. Troponin rose to 3.47 before trending down.  She underwent cardiac catheterization which revealed severe 3 vessel coronary artery disease, including 75% LAD, 95% proximal circumflex, 90% proximal to mid RCA, with normal LV function.  It was felt that the patient would benefit from surgical revascularization. TCTS was consulted and Dr. Laneta Simmers saw the patient.  He agreed that CABG was her best option for treatment. All risks, benefits and alternatives of surgery were explained in detail, and the patient agreed to proceed. The patient was taken to the operating room and underwent the above procedure.    The postoperative course has generally been uneventful.  She has been a bit hypertensive and mildly tachycardic, and has been started on a beta blocker and ARB for  improved BP and heart rate control. There has been a mild postop blood loss anemia, but she has not required transfusion.  Incisions are healing well.  She is ambulating in the halls without difficulty and is tolerating a regular diet.  She is still requiring 2 liters of oxygen via [place]. She will need this at the facility and may be weaned to room air as tolerates. Also, she has hypothyroid and is on Levothyroxine. Her TSH on 07/28 was low at .101. Dr. Myrtis Ser, decreased her Levothyroxine to 150 mcg. She will need close follow up with her medical doctor after discharge for this and further surveillance of HGA1C 5.8. She  went into a flutter with RVR the evening of 08/04. She was given Lopressor IV and started on an Amiodarone drip protocol. She quickly converted to sinus rhythm and has remained in sinus rhythm. We ask the SNF to please remove the chest tube sutures on Friday 06/15/2015. Finally, because her QTc was elevated after surgery, pharmacy recommended Celexa be reduced to 20 mg daily and this was done.Due to lack of assistance at home, the patient will need a short term skilled nursing facility placement at discharge.  She presently is medically stable for transfer once a bed is available.  We ask the SNF to please do the following: 1. Please obtain vital signs at least one time daily 2.Please weigh the patient daily. If he or she continues to gain weight or develops lower extremity edema, contact the office at [phone]. 3. Ambulate patient at least three times daily and please use sternal precautions.  Recent vital signs:  Filed Vitals:   06/08/15 0446  BP: 124/44  Pulse: 80  Temp: 98.2 F (36.8 C)  Resp: 18    Recent laboratory studies:  CBC:  Recent Labs  06/05/15 1700 06/05/15 1715 06/06/15 0450  WBC 13.2*  --  12.7*  HGB 9.3* 9.5* 9.1*  HCT 28.9* 28.0* 28.4*  PLT 141*  --  144*   BMET:   Recent Labs  06/05/15 1715 06/06/15 0450  NA 136 136  K 4.3 4.3  CL 98* 102  CO2  --  28  GLUCOSE 112* 122*  BUN 15 15  CREATININE 1.10* 1.01*  CALCIUM  --  8.7*    PT/INR: No results for input(s): LABPROT, INR in the last 72 hours.   Discharge Medications:     Medication List    STOP taking these medications        amLODipine 10 MG tablet  Commonly known as:  NORVASC     losartan-hydrochlorothiazide 100-25 MG per tablet  Commonly known as:  HYZAAR     phentermine 37.5 MG tablet  Commonly known as:  ADIPEX-P      TAKE these medications        amiodarone 400 MG tablet  Commonly known as:  PACERONE  Take 1 tablet (400 mg total) by mouth daily. For 5 days then take  Amiodarone 200 mg by mouth two times daily for one week. Finally, take Amiodarone 200 mg by mouth daily thereafter     aspirin 325 MG EC tablet  Take 1 tablet (325 mg total) by mouth daily.     atorvastatin 80 MG tablet  Commonly known as:  LIPITOR  Take 1 tablet (80 mg total) by mouth daily at 6 PM.     budesonide-formoterol 160-4.5 MCG/ACT inhaler  Commonly known as:  SYMBICORT  Inhale 2 puffs into the lungs 2 (two) times daily.  citalopram 20 MG tablet  Commonly known as:  CELEXA  Take 1 tablet by mouth  every morning     furosemide 40 MG tablet  Commonly known as:  LASIX  Take 1 tablet (40 mg total) by mouth daily. For 5 days then stop.     levothyroxine 150 MCG tablet  Commonly known as:  SYNTHROID, LEVOTHROID  Take 1 tablet (150 mcg total) by mouth daily before breakfast. Take 1 tablet by mouth  every day before breakfast     losartan 25 MG tablet  Commonly known as:  COZAAR  Take 1 tablet (25 mg total) by mouth daily.     omeprazole 20 MG capsule  Commonly known as:  PRILOSEC  Take 1 capsule (20 mg total) by mouth every morning.     pantoprazole 40 MG tablet  Commonly known as:  PROTONIX  Take 1 tablet by mouth  daily     potassium chloride SA 20 MEQ tablet  Commonly known as:  K-DUR,KLOR-CON  Take 1 tablet (20 mEq total) by mouth daily. For 5 days then stop.     traMADol 50 MG tablet  Commonly known as:  ULTRAM  Take 1 tablet (50 mg total) by mouth every 4 (four) hours as needed. Take one or two every 6 hours PRN pain     triamcinolone cream 0.1 %  Commonly known as:  KENALOG  Apply 1 application topically 2 (two) times daily as needed (for psorisis).     TYLENOL ARTHRITIS PAIN 650 MG CR tablet  Generic drug:  acetaminophen  Take 1,300 mg by mouth every 8 (eight) hours as needed for pain. Per bottle directions as needed     UNABLE TO FIND  Med Name: Cimzia per Dr. Dierdre Forth       The patient has been discharged on:  1.Beta Blocker: Yes [ x ]  No [  ]  If No, reason:    2.Ace Inhibitor/ARB: Yes [ x ]  No [  ]  If No, reason:    3.Statin: Yes [ x ]  No [ ]   If No, reason:    4.Ecasa: Yes [ x ]  No [ ]   If No, reason:   Follow Up: Follow-up Information    Follow up with , MD On 07/11/2015.   Specialty:  Cardiothoracic Surgery   Why:  Have a chest x-ray at [facility] at 9:00, then see MD at 10:00   Contact information:   [address] [place] [address] [place] [phone]       Follow up with [id number], MD.   Specialty:  Cardiology   Why:  Call for a follow up appointment for 2 weeks   Contact information:   [address] [place] [address] [place] [phone]       Follow up with [id number], MD.   Specialty:  Internal Medicine   Why:  Call for a follow up appointment regarding further surveillance of HGA1C 5.8 (pre diabetes) and hypothyroid treatment (TSH .101 this admission)   Contact information:   [address] [facility] [place] [phone]       [id number] PA-C 06/08/2015, 10:10 AM

## 2015-06-07 NOTE — Clinical Social Work Note (Signed)
Clinical Social Work Assessment  Patient Details  Name: [name] MRN: [id number] Date of Birth: 1946/09/28  Date of referral:  06/07/15               Reason for consult:  Facility Placement, Discharge Planning, Family Concerns                Permission sought to share information with:  Family Supports, Oceanographer granted to share information::  Yes, Verbal Permission Granted  Name::        Agency::  SNFs for referral purpsoes  Relationship::  Children  Contact Information:     Housing/Transportation Living arrangements for the past 2 months:  Single Family Home Source of Information:  Patient, Adult Children Patient Interpreter Needed:  None Criminal Activity/Legal Involvement Pertinent to Current Situation/Hospitalization:  No - Comment as needed Significant Relationships:  Adult Children, Spouse Lives with:  Spouse Do you feel safe going back to the place where you live?  No Need for family participation in patient care:  Yes (Comment) (Pt request)  Care giving concerns:  Pt wanting to be placed at the same facility as her husband. Family unable to provide 24 supervision at discharge.   Social Worker assessment / plan:  CSW very familiar with pt and have spoken with pt and pt family multiple times in regards to pt and pt husband discharge planning. Pt husband was discharged to [facility] and pt would like to dc to facility as well. CSW to send clinicals to SNF to see if pt is appropriate to use Medicare benefit for ST rehab stay. If not, or once pt is finished with rehab, pt will transition to facility ALF and family will pay privately for facility stay. CSW has notified facility of pt and pt husband situation prior to facility accepting pt. Facility confirms they will be able to accept pt at dc. CSW will need to check if facility is able to accept weekend discharges if pt is not able to dc today or tomorrow.  Employment status:   Retired Health and safety inspector:  [employer] PT Recommendations:  Skilled Nursing Facility Information / Referral to community resources:  Skilled Nursing Facility  Patient/Family's Response to care:  Pt agreeable to plan and thankful she can be with her husband  Patient/Family's Understanding of and Emotional Response to Diagnosis, Current Treatment, and Prognosis:  Pt and pt family have a strong understanding of pt condition. Pt very emotional and tearful during CSW visits. She wants to ensure both she and her husband are well taken care of but also would like to remain together.  Emotional Assessment Appearance:  Appears stated age, Well-Groomed Attitude/Demeanor/Rapport:  Crying Affect (typically observed):  Pleasant, Appropriate Orientation:  Oriented to Self, Oriented to Place, Oriented to  Time, Oriented to Situation Alcohol / Substance use:  Not Applicable Psych involvement (Current and /or in the community):  No (Comment)  Discharge Needs  Concerns to be addressed:  Discharge Planning Concerns, Coping/Stress Concerns Readmission within the last 30 days:  No Current discharge risk:  Other (Does not have 24 hr supervision) Barriers to Discharge:  Continued Medical Work up   [employer] [phone]

## 2015-06-07 NOTE — Progress Notes (Addendum)
      [address]       [place] [address]             [phone]        3 Days Post-Op Procedure(s) (LRB): CORONARY ARTERY BYPASS GRAFT times three            with left internal mammary artery and right leg saphenous vein (N/A) TRANSESOPHAGEAL ECHOCARDIOGRAM (TEE)  Subjective: Patient just finished breakfast. She is passing flatus but no bowel movement yet.  Objective: Vital signs in last 24 hours: Temp:  [98 F (36.7 C)-98.8 F (37.1 C)] 98.5 F (36.9 C) (08/04 0600) Pulse Rate:  [81-109] 96 (08/04 0600) Cardiac Rhythm:  [-] Heart block (08/03 2233) Resp:  [14-22] 20 (08/04 0600) BP: (122-154)/(59-101) 131/66 mmHg (08/04 0600) SpO2:  [95 %-100 %] 99 % (08/04 0600) Weight:  [168 lb (76.204 kg)] 168 lb (76.204 kg) (08/04 0600)  Pre op weight 75 kg Current Weight  06/07/15 168 lb (76.204 kg)      Intake/Output from previous day: 08/03 0701 - 08/04 0700 In: 510 [P.O.:510] Out: 1550 [Urine:1550]   Physical Exam:  Cardiovascular: Tachycardic Pulmonary: Slightly diminished at bases at bilaterally; no rales, wheezes, or rhonchi. Abdomen: Soft, non tender, bowel sounds present. Extremities: Mild bilateral lower extremity edema. Wounds: Minor bloody ooze proximal sternal incision. Right lower extremity wounds are clean and dry.  Lab Results: CBC: Recent Labs  06/05/15 1700 06/05/15 1715 06/06/15 0450  WBC 13.2*  --  12.7*  HGB 9.3* 9.5* 9.1*  HCT 28.9* 28.0* 28.4*  PLT 141*  --  144*   BMET:  Recent Labs  06/05/15 0225  06/05/15 1715 06/06/15 0450  NA 137  < > 136 136  K 4.1  < > 4.3 4.3  CL 105  < > 98* 102  CO2 26  --   --  28  GLUCOSE 181*  < > 112* 122*  BUN 11  < > 15 15  CREATININE 0.85  < > 1.10* 1.01*  CALCIUM 8.6*  --   --  8.7*  < > = values in this interval not displayed.  PT/INR:  Lab Results  Component Value Date   INR 1.30 06/04/2015   INR 1.04 06/03/2015   INR 1.08 05/31/2015   ABG:  INR: Will add last  result for INR, ABG once components are confirmed Will add last 4 CBG results once components are confirmed  Assessment/Plan:  1. CV - ST in the 100's. Hypertensive with BP in the 140-150's. On Lopressor 25 mg bid. Will restart low dose Losartan for better BP control and increase Lopressor to 37.5 mg for better HR control. 2.  Pulmonary - On 2 liters of oxygen via [place]. Wean to room air. Encourage incentive spirometer and flutter valve 3. Volume Overload - On Lasix 40 mg daily 4.  Acute blood loss anemia - H and H stable at 9.1 and 28.4 5. Mild thrombocytopenia-platelets up to 144,000 6. Remove EPW in am 7. LOC constipation 8. To SNF, possibly in am  ZIMMERMAN,DONIELLE MPA-C 06/07/2015,7:39 AM   Chart reviewed, patient examined, agree with above. She is making good progress. I think she can go to SNF in the am if there are no changes.

## 2015-06-07 NOTE — Progress Notes (Signed)
RT instructed patient on the use of flutter. Patient able to demonstrate back the proper use.

## 2015-06-08 ENCOUNTER — Other Ambulatory Visit (HOSPITAL_COMMUNITY): Payer: Self-pay | Admitting: Respiratory Therapy

## 2015-06-08 DIAGNOSIS — E039 Hypothyroidism, unspecified: Secondary | ICD-10-CM | POA: Diagnosis not present

## 2015-06-08 DIAGNOSIS — K219 Gastro-esophageal reflux disease without esophagitis: Secondary | ICD-10-CM | POA: Diagnosis not present

## 2015-06-08 DIAGNOSIS — K2270 Barrett's esophagus without dysplasia: Secondary | ICD-10-CM | POA: Diagnosis not present

## 2015-06-08 DIAGNOSIS — J449 Chronic obstructive pulmonary disease, unspecified: Secondary | ICD-10-CM | POA: Diagnosis not present

## 2015-06-08 DIAGNOSIS — L4050 Arthropathic psoriasis, unspecified: Secondary | ICD-10-CM | POA: Diagnosis not present

## 2015-06-08 DIAGNOSIS — R0609 Other forms of dyspnea: Secondary | ICD-10-CM | POA: Diagnosis not present

## 2015-06-08 DIAGNOSIS — Z96652 Presence of left artificial knee joint: Secondary | ICD-10-CM | POA: Diagnosis not present

## 2015-06-08 DIAGNOSIS — I4892 Unspecified atrial flutter: Secondary | ICD-10-CM | POA: Diagnosis not present

## 2015-06-08 DIAGNOSIS — I10 Essential (primary) hypertension: Secondary | ICD-10-CM | POA: Diagnosis not present

## 2015-06-08 DIAGNOSIS — J8410 Pulmonary fibrosis, unspecified: Secondary | ICD-10-CM | POA: Diagnosis not present

## 2015-06-08 DIAGNOSIS — E785 Hyperlipidemia, unspecified: Secondary | ICD-10-CM | POA: Diagnosis not present

## 2015-06-08 DIAGNOSIS — I214 Non-ST elevation (NSTEMI) myocardial infarction: Secondary | ICD-10-CM | POA: Diagnosis not present

## 2015-06-08 DIAGNOSIS — I2510 Atherosclerotic heart disease of native coronary artery without angina pectoris: Secondary | ICD-10-CM | POA: Diagnosis not present

## 2015-06-08 DIAGNOSIS — I018 Other acute rheumatic heart disease: Secondary | ICD-10-CM | POA: Diagnosis not present

## 2015-06-08 DIAGNOSIS — Z951 Presence of aortocoronary bypass graft: Secondary | ICD-10-CM | POA: Diagnosis not present

## 2015-06-08 DIAGNOSIS — Z48812 Encounter for surgical aftercare following surgery on the circulatory system: Secondary | ICD-10-CM | POA: Diagnosis not present

## 2015-06-08 DIAGNOSIS — Z9981 Dependence on supplemental oxygen: Secondary | ICD-10-CM | POA: Diagnosis not present

## 2015-06-08 DIAGNOSIS — M069 Rheumatoid arthritis, unspecified: Secondary | ICD-10-CM | POA: Diagnosis not present

## 2015-06-08 MED ORDER — LEVOTHYROXINE SODIUM 150 MCG PO TABS: 150.0000 ug | ORAL_TABLET | Freq: Every day | ORAL | Status: DC

## 2015-06-08 MED ORDER — CITALOPRAM HYDROBROMIDE 20 MG PO TABS
20.0000 mg | ORAL_TABLET | Freq: Every day | ORAL | Status: DC
  Administered 2015-06-08: 20 mg via ORAL
  Filled 2015-06-08: qty 1

## 2015-06-08 MED ORDER — AMIODARONE HCL 400 MG PO TABS: 400.0000 mg | ORAL_TABLET | Freq: Every day | ORAL | Status: DC

## 2015-06-08 MED ORDER — ATORVASTATIN CALCIUM 80 MG PO TABS: 80.0000 mg | ORAL_TABLET | Freq: Every day | ORAL | Status: DC

## 2015-06-08 MED ORDER — FUROSEMIDE 40 MG PO TABS: 40.0000 mg | ORAL_TABLET | Freq: Every day | ORAL | Status: DC

## 2015-06-08 MED ORDER — LOSARTAN POTASSIUM 25 MG PO TABS: 25.0000 mg | ORAL_TABLET | Freq: Every day | ORAL | Status: DC

## 2015-06-08 MED ORDER — AMIODARONE HCL 200 MG PO TABS
400.0000 mg | ORAL_TABLET | Freq: Every day | ORAL | Status: DC
  Administered 2015-06-08: 400 mg via ORAL
  Filled 2015-06-08: qty 2

## 2015-06-08 MED ORDER — POTASSIUM CHLORIDE CRYS ER 20 MEQ PO TBCR: 20.0000 meq | EXTENDED_RELEASE_TABLET | Freq: Every day | ORAL | Status: DC

## 2015-06-08 MED ORDER — TRAMADOL HCL 50 MG PO TABS: 50.0000 mg | ORAL_TABLET | ORAL | Status: DC | PRN

## 2015-06-08 MED ORDER — ASPIRIN 325 MG PO TBEC
325.0000 mg | DELAYED_RELEASE_TABLET | Freq: Every day | ORAL | Status: DC
Start: 2015-06-08 — End: 2017-11-09

## 2015-06-08 MED ORDER — CITALOPRAM HYDROBROMIDE 20 MG PO TABS: ORAL_TABLET | ORAL | Status: DC

## 2015-06-08 NOTE — Progress Notes (Signed)
CARDIAC REHAB PHASE I   PRE:  Rate/Rhythm: 94 mSR  BP:  Sitting: 145/74        SaO2: 100 2L  MODE:  Ambulation: 500 ft   POST:  Rate/Rhythm: 111 ST, 87 after 3 minutes rest  BP:  Sitting: 137/69         SaO2: 96 2L  Pt eager to discharge, no complaints. Pt ambulated 500 ft on 2L O2, IV, handheld assist, steady gait, tolerated well. OHS discharge education completed with pt daughter at bedside. Reviewed IS, sternal precautions, activity progression, exercise, risk factors, heart healthy diet, sodium restrictions, and phase 2 cardiac rehab. Pt verbalized understanding. Pt agrees to phase 2 cardiac rehab. Will send referral to [facility]. Pt states she already watched cardiac surgery discharge video.  Pt to recliner after walk, feet elevated, call light within reach.   [phone]  Joylene Grapes, RN, BSN 06/08/2015 11:15 AM

## 2015-06-08 NOTE — Progress Notes (Signed)
CSW (Clinical Child psychotherapist) spoke with facility and they are able to accept pt today if medically stable for dc. If pt is not ready today, pt is able to dc over weekend as long as facility receives dc summary today. CSW will notify pt nurse of this information.  Jovon Winterhalter, LCSWA [phone]

## 2015-06-08 NOTE — Progress Notes (Addendum)
      [address]       [place] [address]             [phone]        4 Days Post-Op Procedure(s) (LRB): CORONARY ARTERY BYPASS GRAFT times three            with left internal mammary artery and right leg saphenous vein (N/A) TRANSESOPHAGEAL ECHOCARDIOGRAM (TEE)  Subjective: Patient eating breakfast. Had bowel movement.  Objective: Vital signs in last 24 hours: Temp:  [98.2 F (36.8 C)-99.5 F (37.5 C)] 98.2 F (36.8 C) (08/05 0446) Pulse Rate:  [80-120] 80 (08/05 0446) Cardiac Rhythm:  [-] Normal sinus rhythm (08/04 2009) Resp:  [18] 18 (08/05 0446) BP: (93-138)/(40-90) 124/44 mmHg (08/05 0446) SpO2:  [90 %-99 %] 97 % (08/05 0719) Weight:  [166 lb 4.8 oz (75.433 kg)] 166 lb 4.8 oz (75.433 kg) (08/05 0446)  Pre op weight 75 kg Current Weight  06/08/15 166 lb 4.8 oz (75.433 kg)      Intake/Output from previous day: 08/04 0701 - 08/05 0700 In: 683 [P.O.:680; I.V.:3] Out: 600 [Urine:600]   Physical Exam:  Cardiovascular: RRR Pulmonary: Slightly diminished at bases at bilaterally; no rales, wheezes, or rhonchi. Abdomen: Soft, non tender, bowel sounds present. Extremities: Mild bilateral lower extremity edema. Wounds: Clean and dry. No signs of infection.  Lab Results: CBC:  Recent Labs  06/05/15 1700 06/05/15 1715 06/06/15 0450  WBC 13.2*  --  12.7*  HGB 9.3* 9.5* 9.1*  HCT 28.9* 28.0* 28.4*  PLT 141*  --  144*   BMET:   Recent Labs  06/05/15 1715 06/06/15 0450  NA 136 136  K 4.3 4.3  CL 98* 102  CO2  --  28  GLUCOSE 112* 122*  BUN 15 15  CREATININE 1.10* 1.01*  CALCIUM  --  8.7*    PT/INR:  Lab Results  Component Value Date   INR 1.30 06/04/2015   INR 1.04 06/03/2015   INR 1.08 05/31/2015   ABG:  INR: Will add last result for INR, ABG once components are confirmed Will add last 4 CBG results once components are confirmed  Assessment/Plan:  1. CV - A flutter yesterday early evening with HR into the 140's.  Give Lopressor IV followed by Amiodarone protocol. Converted to SR right away. Maintaining SR in the 80's this am. On Amiodarone drip, Lopressor 37.5 mg bid, and Cozaar 25 mg daily. Will stop Amiodarone drip and start oral this am. 2.  Pulmonary - On 2 liters of oxygen via [place].  Likely will need oxygen at discharge.Encourage incentive spirometer and flutter valve 3. Volume Overload - On Lasix 40 mg daily 4.  Acute blood loss anemia - H and H stable at 9.1 and 28.4 5. Mild thrombocytopenia-platelets up to 144,000 6. Remove EPW  7. Per pharmacy, QTc previously prolonged and on Celexa. Will decrease to 20 mg daily. 8. Patient really wants to go to SNF today as husband is in rehab after stroke. Will discuss disposition with surgeon.  Neven Fina MPA-C 06/08/2015,7:35 AM

## 2015-06-08 NOTE — Progress Notes (Signed)
CSW (Clinical Child psychotherapist) notified facility of plan for [employer] today. CSW paged PA to get fl2 signed but unable to reach. Pt nurse to continue trying to reach PA/MD for signature. Per pt nurse, pt will not be ready for dc until later this afternoon. Pt nurse to notify CSW when non-emergent ambulance can be arrange. CSW did notify pt and pt daughter of plan.  [name], LCSWA [phone]

## 2015-06-08 NOTE — Progress Notes (Signed)
Discussed discharge education with the patient and daughter Cala Bradford. All questioned fully answered. Education included post CABG expectations and medications. She will call me if any problems arise.   Leonidas Romberg RN

## 2015-06-08 NOTE — Care Management Note (Signed)
Case Management Note CM noted started by Tomi Bamberger RNCM  Patient Details  Name: [name] MRN: [id number] Date of Birth: 11/07/1945  Subjective/Objective:   Pt admitted for cp.                  Action/Plan: CM to monitor for disposition needs.    Expected Discharge Date:                  Expected Discharge Plan:  Skilled Nursing Facility  In-House Referral:     Discharge planning Services  CM Consult  Post Acute Care Choice:    Choice offered to:     DME Arranged:    DME Agency:     HH Arranged:    HH Agency:     Status of Service:  Complete  Medicare Important Message Given:  Yes-third notification given Date Medicare IM Given:    Medicare IM give by:    Date Additional Medicare IM Given:    Additional Medicare Important Message give by:     If discussed at Long Length of Stay Meetings, dates discussed:   06/05/15  Additional Comments: 06/08/15 [name], RN, BSN [phone] Pt will discharge today to SNF via CSW   06/04/15- pt for CABG today- CM to follow post op for d/c needs  [name], RN 06/08/2015, 11:44 AM

## 2015-06-08 NOTE — Progress Notes (Signed)
Removed epicardial wires per order. 4 intact.  Pt tolerated procedure well.  Pt instructed to remain on bedrest for one hour.  Frequent vitals will be taken and documented. Pt resting with call bell within reach. Khadeeja Elden McClintock, RN   

## 2015-06-08 NOTE — Discharge Instructions (Signed)
We ask the SNF to please do the following: 1. Please obtain vital signs at least one time daily 2.Please weigh the patient daily. If he or she continues to gain weight or develops lower extremity edema, contact the office at [phone]. 3. Ambulate patient at least three times daily and please use sternal precautions.  Activity: 1.May walk up steps                2.No lifting more than ten pounds for four weeks.                 3.No driving for four weeks.                4.Stop any activity that causes chest pain, shortness of breath, dizziness, sweating or excessive weakness.                5.Avoid straining.                6.Continue with your breathing exercises daily.  Diet:  Low fat, Low salt diet  Wound Care: May shower.  Clean wounds with mild soap and water daily. Contact the office at [phone] if any problems arise.  Coronary Artery Bypass Grafting, Care After Refer to this sheet in the next few weeks. These instructions provide you with information on caring for yourself after your procedure. Your health care provider may also give you more specific instructions. Your treatment has been planned according to current medical practices, but problems sometimes occur. Call your health care provider if you have any problems or questions after your procedure. WHAT TO EXPECT AFTER THE PROCEDURE Recovery from surgery will be different for everyone. Some people feel well after 3 or 4 weeks, while for others it takes longer. After your procedure, it is typical to have the following:  Nausea and a lack of appetite.   Constipation.  Weakness and fatigue.   Depression or irritability.   Pain or discomfort at your incision site. HOME CARE INSTRUCTIONS  Take medicines only as directed by your health care provider. Do not stop taking medicines or start any new medicines without first checking with your health care provider.  Take your pulse as directed by your health care  provider.  Perform deep breathing as directed by your health care provider. If you were given a device called an incentive spirometer, use it to practice deep breathing several times a day. Support your chest with a pillow or your arms when you take deep breaths or cough.  Keep incision areas clean, dry, and protected. Remove or change any bandages (dressings) only as directed by your health care provider. You may have skin adhesive strips over the incision areas. Do not take the strips off. They will fall off on their own.  Check incision areas daily for any swelling, redness, or drainage.  If incisions were made in your legs, do the following:  Avoid crossing your legs.   Avoid sitting for long periods of time. Change positions every 30 minutes.   Elevate your legs when you are sitting.  Wear compression stockings as directed by your health care provider. These stockings help keep blood clots from forming in your legs.  Take showers once your health care provider approves. Until then, only take sponge baths. Pat incisions dry. Do not rub incisions with a washcloth or towel. Do not take baths, swim, or use a hot tub until your health care provider approves.  Eat foods that are high in  fiber, such as raw fruits and vegetables, whole grains, beans, and nuts. Meats should be lean cut. Avoid canned, processed, and fried foods.  Drink enough fluid to keep your urine clear or pale yellow.  Weigh yourself every day. This helps identify if you are retaining fluid that may make your heart and lungs work harder.  Rest and limit activity as directed by your health care provider. You may be instructed to:  Stop any activity at once if you have chest pain, shortness of breath, irregular heartbeats, or dizziness. Get help right away if you have any of these symptoms.  Move around frequently for short periods or take short walks as directed by your health care provider. Increase your activities  gradually. You may need physical therapy or cardiac rehabilitation to help strengthen your muscles and build your endurance.  Avoid lifting, pushing, or pulling anything heavier than 10 lb (4.5 kg) for at least 6 weeks after surgery.  Do not drive until your health care provider approves.  Ask your health care provider when you may return to work.  Ask your health care provider when you may resume sexual activity.  Keep all follow-up visits as directed by your health care provider. This is important. SEEK MEDICAL CARE IF:  You have swelling, redness, increasing pain, or drainage at the site of an incision.  You have a fever.  You have swelling in your ankles or legs.  You have pain in your legs.   You gain 2 or more pounds (0.9 kg) a day.  You are nauseous or vomit.  You have diarrhea. SEEK IMMEDIATE MEDICAL CARE IF:  You have chest pain that goes to your jaw or arms.  You have shortness of breath.   You have a fast or irregular heartbeat.   You notice a "clicking" in your breastbone (sternum) when you move.   You have numbness or weakness in your arms or legs.  You feel dizzy or light-headed.  MAKE SURE YOU:  Understand these instructions.  Will watch your condition.  Will get help right away if you are not doing well or get worse. Document Released: 05/09/2005 Document Revised: 03/06/2014 Document Reviewed: 03/29/2013 [facility] Patient Information 2015 [place], [place]. This information is not intended to replace advice given to you by your health care provider. Make sure you discuss any questions you have with your health care provider.

## 2015-06-11 DIAGNOSIS — I018 Other acute rheumatic heart disease: Secondary | ICD-10-CM | POA: Diagnosis not present

## 2015-06-11 DIAGNOSIS — E039 Hypothyroidism, unspecified: Secondary | ICD-10-CM | POA: Diagnosis not present

## 2015-06-11 DIAGNOSIS — J8410 Pulmonary fibrosis, unspecified: Secondary | ICD-10-CM | POA: Diagnosis not present

## 2015-06-11 DIAGNOSIS — J449 Chronic obstructive pulmonary disease, unspecified: Secondary | ICD-10-CM | POA: Diagnosis not present

## 2015-06-11 DIAGNOSIS — I10 Essential (primary) hypertension: Secondary | ICD-10-CM | POA: Diagnosis not present

## 2015-06-11 DIAGNOSIS — K219 Gastro-esophageal reflux disease without esophagitis: Secondary | ICD-10-CM | POA: Diagnosis not present

## 2015-06-11 DIAGNOSIS — R0609 Other forms of dyspnea: Secondary | ICD-10-CM | POA: Diagnosis not present

## 2015-06-14 ENCOUNTER — Ambulatory Visit: Payer: Medicare Other | Admitting: Gastroenterology

## 2015-06-14 ENCOUNTER — Telehealth: Payer: Self-pay | Admitting: Gastroenterology

## 2015-06-14 DIAGNOSIS — J449 Chronic obstructive pulmonary disease, unspecified: Secondary | ICD-10-CM | POA: Diagnosis not present

## 2015-06-14 DIAGNOSIS — Z951 Presence of aortocoronary bypass graft: Secondary | ICD-10-CM | POA: Diagnosis not present

## 2015-06-14 DIAGNOSIS — E039 Hypothyroidism, unspecified: Secondary | ICD-10-CM | POA: Diagnosis not present

## 2015-06-14 DIAGNOSIS — I4892 Unspecified atrial flutter: Secondary | ICD-10-CM | POA: Diagnosis not present

## 2015-06-14 NOTE — Telephone Encounter (Signed)
No Charge for her

## 2015-06-15 ENCOUNTER — Other Ambulatory Visit: Payer: Self-pay | Admitting: *Deleted

## 2015-06-15 DIAGNOSIS — I018 Other acute rheumatic heart disease: Secondary | ICD-10-CM | POA: Diagnosis not present

## 2015-06-15 DIAGNOSIS — D649 Anemia, unspecified: Secondary | ICD-10-CM

## 2015-06-15 DIAGNOSIS — R0609 Other forms of dyspnea: Secondary | ICD-10-CM | POA: Diagnosis not present

## 2015-06-15 DIAGNOSIS — I10 Essential (primary) hypertension: Secondary | ICD-10-CM | POA: Diagnosis not present

## 2015-06-15 DIAGNOSIS — E785 Hyperlipidemia, unspecified: Secondary | ICD-10-CM | POA: Diagnosis not present

## 2015-06-15 DIAGNOSIS — J449 Chronic obstructive pulmonary disease, unspecified: Secondary | ICD-10-CM | POA: Diagnosis not present

## 2015-06-15 DIAGNOSIS — E039 Hypothyroidism, unspecified: Secondary | ICD-10-CM | POA: Diagnosis not present

## 2015-06-15 DIAGNOSIS — K219 Gastro-esophageal reflux disease without esophagitis: Secondary | ICD-10-CM | POA: Diagnosis not present

## 2015-06-15 DIAGNOSIS — J8410 Pulmonary fibrosis, unspecified: Secondary | ICD-10-CM | POA: Diagnosis not present

## 2015-06-18 DIAGNOSIS — Z48812 Encounter for surgical aftercare following surgery on the circulatory system: Secondary | ICD-10-CM | POA: Diagnosis not present

## 2015-06-18 DIAGNOSIS — K2270 Barrett's esophagus without dysplasia: Secondary | ICD-10-CM | POA: Diagnosis not present

## 2015-06-19 ENCOUNTER — Telehealth: Payer: Self-pay | Admitting: Internal Medicine

## 2015-06-19 NOTE — Telephone Encounter (Signed)
Called and spoke to [place] with [facility] told her she will need to contact Cardiology and Pulmonary they are prescribing the medications that she is talking about. Pt just had Open Heart Surgery. [name] verbalized understanding and asked for providers contact numbers. Phone numbers given. [name] verbalized understanding.

## 2015-06-19 NOTE — Telephone Encounter (Signed)
Heather from [facility] called regarding medication interaction. Medication interaction level 1 celexa and amiodarone; level 2 amiodarone and symbicort. Herbert Seta stated that she would email the level. Herbert Seta would like for you to give her a call back regarding this. She can be reached at [phone]

## 2015-06-19 NOTE — Telephone Encounter (Signed)
Called Heather and LMTCB x1

## 2015-06-20 ENCOUNTER — Telehealth: Payer: Self-pay | Admitting: Internal Medicine

## 2015-06-20 NOTE — Telephone Encounter (Signed)
Called and spoke to [place] with [place]. Herbert Seta states the pt is taking Symbicort 160 and Amiodarone 400mg  and this is a level 2 for risk of reaction between the two medications. Pt is not experiencing any reaction s/s. states this is protocol to inform the physician in case the medication needs to be changed.   Dr. Herbert Seta please advise. Thanks.

## 2015-06-20 NOTE — Telephone Encounter (Signed)
No change rx

## 2015-06-20 NOTE — Telephone Encounter (Signed)
Error

## 2015-06-20 NOTE — Telephone Encounter (Signed)
lmtcb X1 for [employer] at [facility]

## 2015-06-21 NOTE — Telephone Encounter (Signed)
Spoke with [employer]. She is aware to not make any changes to the pt's prescription.

## 2015-06-25 DIAGNOSIS — Z48812 Encounter for surgical aftercare following surgery on the circulatory system: Secondary | ICD-10-CM | POA: Diagnosis not present

## 2015-06-26 ENCOUNTER — Telehealth: Payer: Self-pay | Admitting: Internal Medicine

## 2015-06-26 DIAGNOSIS — Z48812 Encounter for surgical aftercare following surgery on the circulatory system: Secondary | ICD-10-CM | POA: Diagnosis not present

## 2015-06-26 DIAGNOSIS — K2270 Barrett's esophagus without dysplasia: Secondary | ICD-10-CM | POA: Diagnosis not present

## 2015-06-26 NOTE — Telephone Encounter (Signed)
ok 

## 2015-06-26 NOTE — Telephone Encounter (Signed)
Called Sree with [facility] and left detailed message on his personal voicemail, Verbal orders given for home care 1 x week for one week and 2x week for 3 weeks. Any questions please call office.

## 2015-06-26 NOTE — Telephone Encounter (Signed)
[name] from [facility] [phone]) needs a Verbal order for 1 x week for one week and 2x week for 3 weeks.

## 2015-06-26 NOTE — Telephone Encounter (Signed)
Okay for Home Health orders, see message below.

## 2015-06-27 DIAGNOSIS — K2270 Barrett's esophagus without dysplasia: Secondary | ICD-10-CM | POA: Diagnosis not present

## 2015-06-27 DIAGNOSIS — Z48812 Encounter for surgical aftercare following surgery on the circulatory system: Secondary | ICD-10-CM | POA: Diagnosis not present

## 2015-07-02 DIAGNOSIS — Z48812 Encounter for surgical aftercare following surgery on the circulatory system: Secondary | ICD-10-CM | POA: Diagnosis not present

## 2015-07-02 DIAGNOSIS — K2270 Barrett's esophagus without dysplasia: Secondary | ICD-10-CM | POA: Diagnosis not present

## 2015-07-03 ENCOUNTER — Telehealth: Payer: Self-pay | Admitting: Internal Medicine

## 2015-07-03 NOTE — Telephone Encounter (Signed)
Left detailed message on personal voicemail for [name], verbal orders given for Home Health Nursing visits once a week for 3 weeks okay per Dr. Kirtland Bouchard. Any questions please call office.

## 2015-07-03 NOTE — Telephone Encounter (Signed)
Herbert Seta rn is calling for verbal order for home health with nursing visiting one visit a wk for 3 wks.

## 2015-07-03 NOTE — Telephone Encounter (Signed)
Please set up

## 2015-07-03 NOTE — Telephone Encounter (Signed)
Okay for Home Health for pt?

## 2015-07-04 DIAGNOSIS — Z48812 Encounter for surgical aftercare following surgery on the circulatory system: Secondary | ICD-10-CM | POA: Diagnosis not present

## 2015-07-04 DIAGNOSIS — K2270 Barrett's esophagus without dysplasia: Secondary | ICD-10-CM | POA: Diagnosis not present

## 2015-07-04 NOTE — Telephone Encounter (Signed)
Called and left voicemail for Herbert Seta to call office back.

## 2015-07-10 ENCOUNTER — Other Ambulatory Visit: Payer: Self-pay | Admitting: Surgery

## 2015-07-10 DIAGNOSIS — Z951 Presence of aortocoronary bypass graft: Secondary | ICD-10-CM

## 2015-07-11 ENCOUNTER — Ambulatory Visit (INDEPENDENT_AMBULATORY_CARE_PROVIDER_SITE_OTHER): Payer: Self-pay | Admitting: Surgery

## 2015-07-11 ENCOUNTER — Ambulatory Visit
Admission: RE | Admit: 2015-07-11 | Discharge: 2015-07-11 | Disposition: A | Discharge status: Home / Self Care | Payer: Medicare Other | Source: Ambulatory Visit | Attending: Surgery | Admitting: Surgery

## 2015-07-11 ENCOUNTER — Encounter: Payer: Self-pay | Admitting: Surgery

## 2015-07-11 VITALS — BP 134/64 | HR 76 | Resp 20 | Ht 59.0 in | Wt 166.0 lb

## 2015-07-11 DIAGNOSIS — Z951 Presence of aortocoronary bypass graft: Secondary | ICD-10-CM | POA: Diagnosis not present

## 2015-07-11 DIAGNOSIS — I2510 Atherosclerotic heart disease of native coronary artery without angina pectoris: Secondary | ICD-10-CM

## 2015-07-11 NOTE — Progress Notes (Signed)
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 The patient's early postoperative recovery while in the hospital was notable for development of postoperative atrial fibrillation converted with amiodarone. Since hospital discharge the patient reports that she is doing well. She is walking short distances without chest pain or shortness of breath. She has been somewhat limited due to her husband being rehospitalized after a stroke. He is now going home with hospice care.   Current Outpatient Prescriptions  Medication Sig Dispense Refill  . acetaminophen (TYLENOL ARTHRITIS PAIN) 650 MG CR tablet Take 1,300 mg by mouth every 8 (eight) hours as needed for pain. Per bottle directions as needed    . amiodarone (PACERONE) 400 MG tablet Take 1 tablet (400 mg total) by mouth daily. For 5 days then take Amiodarone 200 mg by mouth two times daily for one week. Finally, take Amiodarone 200 mg by mouth daily thereafter    . aspirin EC 325 MG EC tablet Take 1 tablet (325 mg total) by mouth daily. 30 tablet 0  . atorvastatin (LIPITOR) 80 MG tablet Take 1 tablet (80 mg total) by mouth daily at 6 PM.    . budesonide-formoterol (SYMBICORT) 160-4.5 MCG/ACT inhaler Inhale 2 puffs into the lungs 2 (two) times daily. 3 Inhaler 3  . citalopram (CELEXA) 20 MG tablet Take 1 tablet by mouth  every morning    . furosemide (LASIX) 40 MG tablet Take 1 tablet (40 mg total) by mouth daily. For 5 days then stop. 30 tablet   . levothyroxine (SYNTHROID, LEVOTHROID) 150 MCG tablet Take 1 tablet (150 mcg total) by mouth daily before breakfast. Take 1 tablet by mouth  every day before breakfast    . losartan (COZAAR) 25 MG tablet Take 1 tablet (25 mg total) by mouth daily.    Marland Kitchen omeprazole (PRILOSEC) 20 MG capsule Take 1 capsule (20 mg total) by mouth every morning. 90 capsule 1  . pantoprazole (PROTONIX) 40 MG tablet Take 1 tablet by mouth  daily (Patient taking differently: Take  40 mg by mouth 2 (two) times daily. ) 90 tablet 1  . potassium chloride SA (K-DUR,KLOR-CON) 20 MEQ tablet Take 1 tablet (20 mEq total) by mouth daily. For 5 days then stop.    . traMADol (ULTRAM) 50 MG tablet Take 1 tablet (50 mg total) by mouth every 4 (four) hours as needed. Take one or two every 6 hours PRN pain 30 tablet 0  . triamcinolone cream (KENALOG) 0.1 % Apply 1 application topically 2 (two) times daily as needed (for psorisis).    Marland Kitchen UNABLE TO FIND Med Name: Cimzia per Dr. Dierdre Forth     No current facility-administered medications for this visit.    Physical Exam: BP 134/64 mmHg  Pulse 76  Resp 20  Ht 4\' 11"  (1.499 m)  Wt 166 lb (75.297 kg)  BMI 33.51 kg/m2  SpO2 96% She looks well. Lung exam is clear. Cardiac exam shows a regular rate and rhythm with normal heart sounds. Chest incision is healing well and sternum is stable. The leg incisions are healing well and there is no peripheral edema.   Diagnostic Tests:  CLINICAL DATA: Post CABG 06/04/2015  EXAM: CHEST 2 VIEW  COMPARISON: 06/06/2015 and 05/31/2015  FINDINGS: Cardiomediastinal silhouette is stable. Status post CABG. No acute infiltrate or pleural effusion. No pulmonary edema. Mild degenerative changes thoracic spine. Stable mild compression deformity upper lumbar spine.  IMPRESSION: No active cardiopulmonary disease. Status post CABG.  Electronically Signed  By: Natasha Mead M.D.  On: 07/11/2015 09:45   Impression:  Overall I think she is doing well. I encouraged her to continue walking. She is not planning to participate in cardiac rehab but I have encouraged her to do so. I told her that she could drive her car but should not lift anything heavier than 10 lbs for three months postop. She has not seen cardiology for follow up yet but is planning to see Dr. Delton See who has seen her husband.   Plan:  She will follow up with cardiology and return to see me if she develops any problems  with her incisions.   Alleen Borne, MD [facility] [phone]

## 2015-07-12 ENCOUNTER — Encounter: Payer: Self-pay | Admitting: Cardiology

## 2015-07-12 ENCOUNTER — Ambulatory Visit (INDEPENDENT_AMBULATORY_CARE_PROVIDER_SITE_OTHER): Payer: Medicare Other | Admitting: Cardiology

## 2015-07-12 ENCOUNTER — Ambulatory Visit: Payer: Medicare Other | Admitting: Internal Medicine

## 2015-07-12 VITALS — BP 136/62 | Ht 59.0 in | Wt 165.0 lb

## 2015-07-12 DIAGNOSIS — I2510 Atherosclerotic heart disease of native coronary artery without angina pectoris: Secondary | ICD-10-CM | POA: Diagnosis not present

## 2015-07-12 DIAGNOSIS — E785 Hyperlipidemia, unspecified: Secondary | ICD-10-CM | POA: Diagnosis not present

## 2015-07-12 DIAGNOSIS — D500 Iron deficiency anemia secondary to blood loss (chronic): Secondary | ICD-10-CM

## 2015-07-12 DIAGNOSIS — I10 Essential (primary) hypertension: Secondary | ICD-10-CM

## 2015-07-12 DIAGNOSIS — Z951 Presence of aortocoronary bypass graft: Secondary | ICD-10-CM

## 2015-07-12 DIAGNOSIS — I5033 Acute on chronic diastolic (congestive) heart failure: Secondary | ICD-10-CM | POA: Diagnosis not present

## 2015-07-12 DIAGNOSIS — Z48812 Encounter for surgical aftercare following surgery on the circulatory system: Secondary | ICD-10-CM | POA: Diagnosis not present

## 2015-07-12 DIAGNOSIS — K2270 Barrett's esophagus without dysplasia: Secondary | ICD-10-CM | POA: Diagnosis not present

## 2015-07-12 HISTORY — DX: Hyperlipidemia, unspecified: E78.5

## 2015-07-12 LAB — TSH: TSH: 0.3 u[IU]/mL — ABNORMAL LOW (ref 0.35–4.50)

## 2015-07-12 LAB — CBC WITH DIFFERENTIAL/PLATELET
Basophils Absolute: 0.1 10*3/uL (ref 0.0–0.1)
Basophils Relative: 0.7 % (ref 0.0–3.0)
Eosinophils Absolute: 0.5 10*3/uL (ref 0.0–0.7)
Eosinophils Relative: 5.6 % — ABNORMAL HIGH (ref 0.0–5.0)
HCT: 30.7 % — ABNORMAL LOW (ref 36.0–46.0)
Hemoglobin: 9.9 g/dL — ABNORMAL LOW (ref 12.0–15.0)
Lymphocytes Relative: 16.9 % (ref 12.0–46.0)
Lymphs Abs: 1.6 10*3/uL (ref 0.7–4.0)
MCHC: 32.2 g/dL (ref 30.0–36.0)
MCV: 87.8 fl (ref 78.0–100.0)
Monocytes Absolute: 0.8 10*3/uL (ref 0.1–1.0)
Monocytes Relative: 8.6 % (ref 3.0–12.0)
Neutro Abs: 6.5 10*3/uL (ref 1.4–7.7)
Neutrophils Relative %: 68.2 % (ref 43.0–77.0)
Platelets: 274 10*3/uL (ref 150.0–400.0)
RBC: 3.49 Mil/uL — ABNORMAL LOW (ref 3.87–5.11)
RDW: 15.9 % — ABNORMAL HIGH (ref 11.5–15.5)
WBC: 9.5 10*3/uL (ref 4.0–10.5)

## 2015-07-12 LAB — COMPREHENSIVE METABOLIC PANEL
ALT: 15 U/L (ref 0–35)
AST: 17 U/L (ref 0–37)
Albumin: 3.6 g/dL (ref 3.5–5.2)
Alkaline Phosphatase: 84 U/L (ref 39–117)
BUN: 24 mg/dL — ABNORMAL HIGH (ref 6–23)
CO2: 33 mEq/L — ABNORMAL HIGH (ref 19–32)
Calcium: 9.4 mg/dL (ref 8.4–10.5)
Chloride: 103 mEq/L (ref 96–112)
Creatinine, Ser: 0.88 mg/dL (ref 0.40–1.20)
GFR: 67.85 mL/min (ref >60.00)
Glucose, Bld: 112 mg/dL — ABNORMAL HIGH (ref 70–99)
Potassium: 3.6 mEq/L (ref 3.5–5.1)
Sodium: 141 mEq/L (ref 135–145)
Total Bilirubin: 0.4 mg/dL (ref 0.2–1.2)
Total Protein: 7.2 g/dL (ref 6.0–8.3)

## 2015-07-12 NOTE — Patient Instructions (Signed)
Medication Instructions:   Your physician recommends that you continue on your current medications as directed. Please refer to the Current Medication list given to you today.     Labwork:  TODAY---CHECK CMET, CBC W DIFF, TSH, AND NMR W LIPIDS    Testing/Procedures:  Your physician has requested that you have an echocardiogram. Echocardiography is a painless test that uses sound waves to create images of your heart. It provides your doctor with information about the size and shape of your heart and how well your heart's chambers and valves are working. This procedure takes approximately one hour. There are no restrictions for this procedure.  PER DR NELSON HAVE THIS SCHEDULED PRIOR TO YOUR 2 MONTH FOLLOW-UP APPOINTMENT WITH HER     You have been referred to CARDIAC REHAB AT [place]    Follow-Up:  2 MONTHS WITH DR NELSON---PLEASE HAVE YOUR ECHO DONE PRIOR TO THIS FOLLOW-UP APPOINTMENT  Any Other Special Instructions Will Be Listed Below (If Applicable).

## 2015-07-12 NOTE — Progress Notes (Signed)
Patient ID: [name], female   DOB: 1946/07/28, 69 y.o.   MRN: [id number]      Cardiology Office Note   Date:  07/12/2015   ID:  TONNETTE ZWIEBEL, DOB [birth year], MRN [id number]  PCP:  Rogelia Boga, MD  Cardiologist: Lars Masson, MD   CC:    History of Present Illness: [name] is a 69 y.o. female who presents for post hospitalization visit after NSTEMI--> CABG. She is doing well, dealing with end of life issues of her husband, she is feeling well, feeling tired, but no chest pain, SOB, mild LE edema on the graft leg. No palpitations or syncope, complaint with meds, no side effects.  Past Medical History  Diagnosis Date  . Psoriatic arthritis   . Rheumatic disease   . Hypertension   . Hypothyroidism   . Barrett esophagus   . COPD (chronic obstructive pulmonary disease)   . Chronic respiratory failure   . Pulmonary fibrosis   . GERD (gastroesophageal reflux disease)   . SOB (shortness of breath) on exertion   . History of home oxygen therapy     AT NIGHT WHEN SLEEPING 2 L / MIN NASAL CANNULA  . History of ARDS 2006  . Pneumococcal pneumonia 2006    HOSPITALIZED AND DEVELOPED ARDS  . Arthritis     OA RIGHT KNEE WITH PAIN  . NSTEMI (non-ST elevated myocardial infarction) 05/31/2015    Past Surgical History  Procedure Laterality Date  . Carpal tunnel release    . Cosmetic breast surgery    . Abdominoplasty    . Cholecystectomy    . Knee arthroscopy Left   . Total knee arthroplasty Left   . Joint replacement    . Total knee arthroplasty Right 06/06/2013    Procedure: RIGHT TOTAL KNEE ARTHROPLASTY;  Surgeon: Loanne Drilling, MD;  Location: [facility] ORS;  Service: Orthopedics;  Laterality: Right;  . Cardiac catheterization N/A 06/01/2015    Procedure: Left Heart Cath and Coronary Angiography;  Surgeon: Runell Gess, MD;  Location: [facility] INVASIVE CV LAB;  Service: Cardiovascular;  Laterality: N/A;  . Coronary artery bypass graft N/A 06/04/2015    Procedure:  CORONARY ARTERY BYPASS GRAFT times three            with left internal mammary artery and right leg saphenous vein;  Surgeon: Alleen Borne, MD;  Location: [facility] OR;  Service: Open Heart Surgery;  Laterality: N/A;  . Tee without cardioversion  06/04/2015    Procedure: TRANSESOPHAGEAL ECHOCARDIOGRAM (TEE);  Surgeon: Alleen Borne, MD;  Location: [facility] OR;  Service: Open Heart Surgery;;     Current Outpatient Prescriptions  Medication Sig Dispense Refill  . acetaminophen (TYLENOL ARTHRITIS PAIN) 650 MG CR tablet Take 1,300 mg by mouth every 8 (eight) hours as needed for pain. Per bottle directions as needed    . amiodarone (PACERONE) 400 MG tablet Take 1 tablet (400 mg total) by mouth daily. For 5 days then take Amiodarone 200 mg by mouth two times daily for one week. Finally, take Amiodarone 200 mg by mouth daily thereafter    . aspirin EC 325 MG EC tablet Take 1 tablet (325 mg total) by mouth daily. 30 tablet 0  . atorvastatin (LIPITOR) 80 MG tablet Take 1 tablet (80 mg total) by mouth daily at 6 PM.    . budesonide-formoterol (SYMBICORT) 160-4.5 MCG/ACT inhaler Inhale 2 puffs into the lungs 2 (two) times daily. 3 Inhaler 3  . citalopram (CELEXA) 20  MG tablet Take 1 tablet by mouth  every morning    . furosemide (LASIX) 40 MG tablet Take 1 tablet (40 mg total) by mouth daily. For 5 days then stop. 30 tablet   . levothyroxine (SYNTHROID, LEVOTHROID) 150 MCG tablet Take 1 tablet (150 mcg total) by mouth daily before breakfast. Take 1 tablet by mouth  every day before breakfast    . losartan (COZAAR) 25 MG tablet Take 1 tablet (25 mg total) by mouth daily.    Marland Kitchen omeprazole (PRILOSEC) 20 MG capsule Take 1 capsule (20 mg total) by mouth every morning. 90 capsule 1  . pantoprazole (PROTONIX) 40 MG tablet Take 1 tablet by mouth  daily (Patient taking differently: Take 40 mg by mouth 2 (two) times daily. ) 90 tablet 1  . potassium chloride SA (K-DUR,KLOR-CON) 20 MEQ tablet Take 1 tablet (20 mEq total) by mouth  daily. For 5 days then stop.    . traMADol (ULTRAM) 50 MG tablet Take 1 tablet (50 mg total) by mouth every 4 (four) hours as needed. Take one or two every 6 hours PRN pain 30 tablet 0  . triamcinolone cream (KENALOG) 0.1 % Apply 1 application topically 2 (two) times daily as needed (for psorisis).    Marland Kitchen UNABLE TO FIND Med Name: Cimzia per Dr. Dierdre Forth     No current facility-administered medications for this visit.    Allergies:   Review of patient's allergies indicates no known allergies.    Social History:  The patient  reports that she quit smoking about 10 years ago. Her smoking use included Cigarettes. She has a 87.75 pack-year smoking history. She has never used smokeless tobacco. She reports that she does not drink alcohol or use illicit drugs.   Family History:  The patient's family history includes Breast cancer in her mother; Coronary artery disease in her father; Rheum arthritis in her father.    ROS:  Please see the history of present illness.  All other systems are reviewed and negative.    PHYSICAL EXAM: VS:  There were no vitals taken for this visit. , BMI There is no weight on file to calculate BMI. GEN: Well nourished, well developed, in no acute distress HEENT: normal Neck: no JVD, carotid bruits, or masses Cardiac: RRR; no 3/6 systolic murmur, also rub, no gallops , mild LE edema R>L  Respiratory:  clear to auscultation bilaterally, normal work of breathing GI: soft, nontender, nondistended, + BS MS: no deformity or atrophy Skin: warm and dry, no rash Neuro:  Strength and sensation are intact Psych: euthymic mood, full affect   EKG:  SRLAE, iRBBB, anterior MI age undetermined   Recent Labs: 05/31/2015: TSH 0.101* 06/01/2015: ALT 16 06/05/2015: Magnesium 2.0 06/06/2015: BUN 15; Creatinine, Ser 1.01*; Hemoglobin 9.1*; Platelets 144*; Potassium 4.3; Sodium 136    Lipid Panel    Component Value Date/Time   CHOL 149 06/01/2015 0400   TRIG 126 06/01/2015 0400    HDL 34* 06/01/2015 0400   CHOLHDL 4.4 06/01/2015 0400   VLDL 25 06/01/2015 0400   LDLCALC 90 06/01/2015 0400   LDLDIRECT 131.1 08/10/2012 0829      Wt Readings from Last 3 Encounters:  07/11/15 166 lb (75.297 kg)  06/08/15 166 lb 4.8 oz (75.433 kg)  04/19/15 176 lb (79.833 kg)      ASSESSMENT AND PLAN:   ECHO: 06/01/2015 - Left ventricle: The cavity size was normal. Wall thickness was increased in a pattern of mild LVH. Systolic function was normal.  The estimated ejection fraction was in the range of 55% to 60%. There is hypokinesis of the basalinferolateral myocardium. Doppler parameters are consistent with abnormal left ventricular relaxation (grade 1 diastolic dysfunction). - Mitral valve: Calcified annulus. Mildly thickened leaflets .  Impressions: - Hypokinesis of the basal/mild inferior lateral wall with overall preserved LV function; grade 1 diastolic dysfunction; mild LVH; trace MR.  TELEMETRY:  SR  TTE: 06/01/15 Study Conclusions  - Left ventricle: The cavity size was normal. Wall thickness was increased in a pattern of mild LVH. Systolic function was normal. The estimated ejection fraction was in the range of 55% to 60%. There is hypokinesis of the basalinferolateral myocardium. Doppler parameters are consistent with abnormal left ventricular relaxation (grade 1 diastolic dysfunction). - Mitral valve: Calcified annulus. Mildly thickened leaflets .  Impressions:  - Hypokinesis of the basal/mild inferior lateral wall with overall preserved LV function; grade 1 diastolic dysfunction; mild LVH; trace MR.    ASSESSMENT AND PLAN:  1. NSTEMI (non-ST elevated myocardial infarction)    three-vessel disease with preserved LV function. She has a long segmental calcified mid LAD, long RCA and high-grade proximal thrombotic appearing AV groove circumflex stenosis.  s/p CABG on 05/28/15, doing well, feels deconditioned. On aspirin and  atorvastatin, metoprolol discontinued as she was bradycardic.  There is a inferior wall hypokinesis, we can consider decreasing the dose of amlodipine and started lisinopril.   2. HTN - controlled  3. Hyperlipidemia - on atorvastatin  4. Hypothyroidism     Her TSH is low at 0.1. T4 is high. She is on 200 g of thyroid replacement daily. It would appear that she should be on a lower dose. I will lower her dose to 150 g. This will need careful follow-up as an outpatient.  5. Anemia - post CABG, the last 9.1, on Fe supplements  Labs today: CBC, CMP, lipids, TSH  Refer to cardiac rehab  Follow up in 2 months with echo prior to the visit  Signed, Lars Masson, MD  07/12/2015 2:20 PM    [facility] [address] [place], [place], [place]  [address] Phone: [phone]; Fax: [phone]

## 2015-07-13 ENCOUNTER — Telehealth: Payer: Self-pay | Admitting: *Deleted

## 2015-07-13 DIAGNOSIS — Z48812 Encounter for surgical aftercare following surgery on the circulatory system: Secondary | ICD-10-CM | POA: Diagnosis not present

## 2015-07-13 DIAGNOSIS — K2270 Barrett's esophagus without dysplasia: Secondary | ICD-10-CM | POA: Diagnosis not present

## 2015-07-13 MED ORDER — LEVOTHYROXINE SODIUM 125 MCG PO TABS: 125.0000 ug | ORAL_TABLET | Freq: Every day | ORAL | Status: DC

## 2015-07-13 NOTE — Telephone Encounter (Signed)
-----   Message from Lars Masson, MD sent at 07/13/2015  3:18 AM EDT ----- Her electrolytes, liver and kidney function is normal. She continues to be anemic and should keep taking iron supplements for another 2 months.  Her TSH is slightly low, I would decrease synthroid to 125 mcg unless it was changed recently.

## 2015-07-13 NOTE — Telephone Encounter (Signed)
Notified the pt that per [name] the pts labs showed that her electrolytes, liver, and kidney function is normal, but she continues to be anemic and should keep taking her iron supplements for another 2 months.  Also informed the pt that per [name] her TSH is slightly low, and she recommends that we decrease her synthroid to 125 mcg po daily, unless it was changed recently.  Pt states that this has not been changed lately.  Confirmed the pharmacy of choice with the pt.  Pt verbalized understanding and agrees with this plan.

## 2015-07-14 LAB — NMR LIPOPROFILE WITH LIPIDS
Cholesterol, Total: 117 mg/dL (ref 100–199)
HDL Particle Number: 24.1 umol/L — ABNORMAL LOW (ref >=30.5)
HDL Size: 10.4 nm (ref >=9.2)
HDL-C: 51 mg/dL (ref >39)
LDL (calc): 51 mg/dL (ref 0–99)
LDL Particle Number: 432 nmol/L (ref <1000)
LDL Size: 21.9 nm (ref >=20.8)
LP-IR Score: <25 (ref <=45)
Large HDL-P: 10.4 umol/L (ref >=4.8)
Large VLDL-P: 2.1 nmol/L (ref <=2.7)
Small LDL Particle Number: <90 nmol/L (ref <=527)
Triglycerides: 75 mg/dL (ref 0–149)
VLDL Size: 48 nm — ABNORMAL HIGH (ref <=46.6)

## 2015-07-16 ENCOUNTER — Encounter: Payer: Self-pay | Admitting: Internal Medicine

## 2015-07-16 DIAGNOSIS — K2270 Barrett's esophagus without dysplasia: Secondary | ICD-10-CM | POA: Diagnosis not present

## 2015-07-16 DIAGNOSIS — Z48812 Encounter for surgical aftercare following surgery on the circulatory system: Secondary | ICD-10-CM | POA: Diagnosis not present

## 2015-07-17 ENCOUNTER — Other Ambulatory Visit: Payer: Self-pay | Admitting: Family Medicine

## 2015-07-17 MED ORDER — LOSARTAN POTASSIUM 25 MG PO TABS: 25.0000 mg | ORAL_TABLET | Freq: Every day | ORAL | Status: DC

## 2015-07-17 MED ORDER — ATORVASTATIN CALCIUM 80 MG PO TABS: 80.0000 mg | ORAL_TABLET | Freq: Every day | ORAL | Status: DC

## 2015-07-17 NOTE — Telephone Encounter (Signed)
Spoke to pt asked her what pharmacy? Pt said [facility] for now I might switch later. Told her okay. Rx's sent to pharmacy. Pt verbalized understanding.

## 2015-07-17 NOTE — Telephone Encounter (Signed)
These medication have not been filled by Dr. Kirtland Huff in the past.  Is he taking over these meds?

## 2015-07-17 NOTE — Telephone Encounter (Signed)
Dr. Kirtland Bouchard, please see message and advise if okay to fill Medications.

## 2015-07-18 ENCOUNTER — Telehealth: Payer: Self-pay | Admitting: Internal Medicine

## 2015-07-18 DIAGNOSIS — K2270 Barrett's esophagus without dysplasia: Secondary | ICD-10-CM | POA: Diagnosis not present

## 2015-07-18 DIAGNOSIS — Z48812 Encounter for surgical aftercare following surgery on the circulatory system: Secondary | ICD-10-CM | POA: Diagnosis not present

## 2015-07-18 NOTE — Telephone Encounter (Signed)
Heather calling from [facility] to report pt is being discharged from home health.  She is also requesting the status of the message she left last week requesting verbal order from Dr. Kirtland Bouchard for her to also receive orders from Dr. Nelson(pt's cardiologist).  Advised caller I did not see a message on this patient from last week requesting a verbal but I would generate a message today and send to pcp.

## 2015-07-19 ENCOUNTER — Telehealth: Payer: Self-pay | Admitting: Internal Medicine

## 2015-07-19 DIAGNOSIS — K2270 Barrett's esophagus without dysplasia: Secondary | ICD-10-CM | POA: Diagnosis not present

## 2015-07-19 DIAGNOSIS — Z48812 Encounter for surgical aftercare following surgery on the circulatory system: Secondary | ICD-10-CM | POA: Diagnosis not present

## 2015-07-19 NOTE — Telephone Encounter (Signed)
[name] from [facility] call to ask if office visit notes can be faxed over  Phone number [phone]    Fax [phone]

## 2015-07-20 NOTE — Telephone Encounter (Signed)
Left detailed message on personal voicemail of Heather's, verbal order given to receive orders from pt's cardiologist Dr. Delton See if required from insurance from PCP okay per Dr. Kirtland Bouchard. Any questions please call the office.

## 2015-07-20 NOTE — Telephone Encounter (Signed)
Spoke to [name], told her I have not seen pt since prior to her admission she is being followed by Cardiology. Need to contact them for office notes if you need them. [name] verbalized understanding.

## 2015-07-23 ENCOUNTER — Encounter: Payer: Self-pay | Admitting: Internal Medicine

## 2015-07-23 ENCOUNTER — Ambulatory Visit (INDEPENDENT_AMBULATORY_CARE_PROVIDER_SITE_OTHER)
Admission: RE | Admit: 2015-07-23 | Discharge: 2015-07-23 | Disposition: A | Discharge status: Home / Self Care | Payer: Medicare Other | Source: Ambulatory Visit | Attending: Internal Medicine | Admitting: Internal Medicine

## 2015-07-23 ENCOUNTER — Telehealth: Payer: Self-pay | Admitting: *Deleted

## 2015-07-23 ENCOUNTER — Ambulatory Visit (INDEPENDENT_AMBULATORY_CARE_PROVIDER_SITE_OTHER): Payer: Medicare Other | Admitting: Internal Medicine

## 2015-07-23 ENCOUNTER — Telehealth: Payer: Self-pay | Admitting: Internal Medicine

## 2015-07-23 VITALS — BP 124/60 | HR 84 | Ht 59.5 in | Wt 166.4 lb

## 2015-07-23 DIAGNOSIS — J45909 Unspecified asthma, uncomplicated: Secondary | ICD-10-CM

## 2015-07-23 DIAGNOSIS — R079 Chest pain, unspecified: Secondary | ICD-10-CM | POA: Diagnosis not present

## 2015-07-23 DIAGNOSIS — I2510 Atherosclerotic heart disease of native coronary artery without angina pectoris: Secondary | ICD-10-CM | POA: Diagnosis not present

## 2015-07-23 DIAGNOSIS — S22000A Wedge compression fracture of unspecified thoracic vertebra, initial encounter for closed fracture: Secondary | ICD-10-CM

## 2015-07-23 DIAGNOSIS — M25511 Pain in right shoulder: Secondary | ICD-10-CM | POA: Diagnosis not present

## 2015-07-23 DIAGNOSIS — E669 Obesity, unspecified: Secondary | ICD-10-CM

## 2015-07-23 MED ORDER — FUROSEMIDE 40 MG PO TABS: 40.0000 mg | ORAL_TABLET | Freq: Every day | ORAL | Status: DC | PRN

## 2015-07-23 MED ORDER — AMOXICILLIN-POT CLAVULANATE 875-125 MG PO TABS: 1.0000 | ORAL_TABLET | Freq: Two times a day (BID) | ORAL | Status: DC

## 2015-07-23 MED ORDER — IOHEXOL 350 MG/ML SOLN
80.0000 mL | Freq: Once | INTRAVENOUS | Status: AC | PRN
  Administered 2015-07-23: 80 mL via INTRAVENOUS

## 2015-07-23 MED ORDER — PREDNISONE 10 MG PO TABS
ORAL_TABLET | ORAL | Status: DC
Start: 2015-07-23 — End: 2015-07-27

## 2015-07-23 MED ORDER — LORAZEPAM 0.5 MG PO TABS: 0.5000 mg | ORAL_TABLET | Freq: Two times a day (BID) | ORAL | Status: DC | PRN

## 2015-07-23 NOTE — Telephone Encounter (Signed)
Done - see result note 

## 2015-07-23 NOTE — Telephone Encounter (Signed)
Please see message and advise 

## 2015-07-23 NOTE — Telephone Encounter (Signed)
Lajoyce Corners,    We also forgot to talk about the size of my lasix dose when you called me on Mon. of last week. And please ask her if I should still be taking Amlodipine 10 mg. Sounds like one I might need to be taking and have been before the heart attack because it is from my mail order Rx company from previously!    This message was sent to [name] and myself by the pt through [facility] on her Husband's account by error.

## 2015-07-23 NOTE — Progress Notes (Signed)
Subjective:   Patient ID: [name], female    DOB: [birth year]    MRN: [id number]     Brief patient profile:  46 yowf quit smoking in May 2006 with dx pna/ ards resumed full activity including yardwork at wt 140 and pft's c/w restrictive changes 04/2010 at wt 170 but reported improvement on saba so maintained chronically on symbicort 160 despite no sign airflow obst on pfts which was confirmed  06/04/15    History of Present Illness  July 18, 2010  1st pulmonary office eval in ER era c/o doe x 6 months progressive indolent onset with copd vs pf.  Ultimately required hosp 8/30-9/1 dx copd/pf ? cause on sulfasalzine and ACE inhib better on 02 and advair but very hoarse with sense of chest congestion and cough with white mucus day > night.  doe x > slow adls.stop advair Start symbicort 160 2 puffs first thing  in am and 2 puffs again in pm about 12 hours later  Work on inhaler technique:   stop benazapril start benicar 40/25  one daily Use 02 sleeping and any activity other than sitting still   04/10/2014 f/u ov/Wert re: restrictive lung dz/ RA/psoriatric/ symbicort 160 2bid  Chief Complaint  Patient presents with  . Follow-up    Pt states was advised to f/u per [employer].  Pt has been having increased SOB- relates to stress from dealing with her spouse's illness. She is using o2 pretty much 24/7.   trying to do more than baseline activity and finds she needs 02 more. rec Work on inhaler technique:   Ok to just use the symbicort 160 2 puffs in am to see what difference if any this makes in your breathing  Change 02 to 2lpm 24/7 and use 3lpm with activity     04/19/2015 f/u ov/Wert re: pf p ards/ obesity/ 02 dep  Chief Complaint  Patient presents with  . Follow-up    Pt states that her breathing has improved with weight loss.  She denies any co's today.    Not limited by breathing from desired activities  On 02 3lpm with exertion rec Please see patient coordinator before you leave  today  to schedule ambulatory home 02 titration to see if you are eligible for POC  06/04/15  cabg   07/23/2015 f/u ov/Wert re: pf p ards s residual pft changes/ clinically has AB Chief Complaint  Patient presents with  . Acute Visit    Pt c/o pain in between her shoulders upon inspiration for the past wk. She states that pain seems worse when she lies down. She had been taking amiodarone until this was d/c'ed on 07/22/15.       Pain was acute onset  R > L post chest around T4/5 and under L breast  assoc with subjective wheeze and yellow/ green mucus but no fever / pain worse with cough and supine  Ultram up to 4 x daily but only taking 3 x daily max  Pain was very severe at onset and some better since   No obvious daytime variabilty or assoc  chest tightness,  overt sinus or hb symptoms. No unusual exp hx or h/o childhood pna/ asthma or knowledge of premature birth.   Sleeping ok on 02 2lpm without nocturnal  or early am exacerbation  of respiratory  c/o's or need for noct saba. Also denies any obvious fluctuation of symptoms with weather or environmental changes or other aggravating or alleviating factors except as  outlined above   Current Medications, Allergies,   Past Surgical History, Family History, and Social History were reviewed in [employer] record.  ROS  The following are not active complaints unless bolded sore throat, dysphagia, dental problems, itching, sneezing,  nasal congestion or excess/ purulent secretions, ear ache,   fever, chills, sweats, unintended wt loss, pleuritic or exertional cp, hemoptysis,  orthopnea pnd or leg swelling, presyncope, palpitations, heartburn, abdominal pain, anorexia, nausea, vomiting, diarrhea  or change in bowel or urinary habits, change in stools or urine, dysuria,hematuria,  rash, arthralgias, visual complaints, headache, numbness weakness or ataxia or problems with walking or coordination,  change in mood/affect or memory.         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       - CT chest 07/03/10 Nonspecific PF mostly upper lobes       - CT chest 12/03/10 acute gg changes and effusions c/w chf        - PFT's  04/12/10 FEV1  1.21 (69%) ratio 77 and no change p B2,  DLC0 56% Asthmatic bronchitis     - HFA 50% p coaching July 18, 2010 >>  90%  02/25/2011  history of mild vitamin D deficiency        Objective:   Physical Exam   amb   mod obese wf  nad     wt 192 July 18, 2010 > 197 August 21, 2010 > 207 December 05, 2010  > 183  01/21/2011  > 179 02/25/2011 >189 08/10/2012 >195 09/08/12 >200 12/27/12 > 202 05/03/2013 > 06/28/2013  199 > 08/08/13 194 > 04/10/2014  203 > 04/19/2015   176 >  07/23/2015 166   HEENT mild turbinate edema.  Oropharynx no thrush or excess pnd or cobblestoning.  No JVD or cervical adenopathy. Mild accessory muscle hypertrophy. Trachea midline, nl thryroid.  Trace crackles on insp  R > L base.. . Regular rate and rhythm without murmur gallop or rub or increase P2 or edema.  Abd: no hsm, nl excursion. Ext warm without cyanosis or clubbing. Skin with classic psoriatic changes both hands      I personally reviewed images and agree with radiology impression as follows:  CTa chest:  07/23/2015  1. No evidence of pulmonary embolus. 2. Mild chronic interstitial lung disease. 3. There is an age-indeterminate T4 vertebral body compression fracture.

## 2015-07-23 NOTE — Telephone Encounter (Signed)
Lorazepam 0.5 #60 one twice daily as needed for anxiety 

## 2015-07-23 NOTE — Patient Instructions (Addendum)
Please see patient coordinator before you leave today  to schedule CTangiogram to sort out this   Augmentin 875 mg take one pill twice daily  X 10 days - take at breakfast and supper with large glass of water.  It would help reduce the usual side effects (diarrhea and yeast infections) if you ate cultured yogurt at lunch.   Prednisone 10 mg take  4 each am x 2 days,   2 each am x 2 days,  1 each am x 2 days and stop   Work on inhaler technique:  relax and gently blow all the way out then take a nice smooth deep breath back in, triggering the inhaler at same time you start breathing in.  Hold for up to 5 seconds if you can. Blow out thru nose. Rinse and gargle with water when done  [facility] to take your ultram for chest pains    Please schedule a follow up office visit in 6 weeks, call sooner if needed

## 2015-07-23 NOTE — Telephone Encounter (Signed)
Spoke to pt, told her Rx for Lorazepam 0.5 mg one tablet twice a day as needed for anxiety was called into pharmacy for you. Pt verbalized understanding.

## 2015-07-23 NOTE — Telephone Encounter (Signed)
Patients husband is in Hospice care and the RN gave some saddening/stressgful news today. Patient would to know if she can have something called in for her nerves/anxiety.

## 2015-07-23 NOTE — Telephone Encounter (Signed)
Lasix was only for 5 days, she can take it PRN from now on for LE edema. I would hold off amlodipine as she is now on losartan and her BP was normal at the last visit.    Informed the pt of the above recommendations per [name] from her mychart message from today.  Informed her that she was only to take lasix scheduled for 5 days only, then she can take it PRN from now on for LE edema.  Also informed the pt that per [name], she recommends the pt hold off of amlodipine as she is now on losartan and her BP was normal at the last visit.  Pt verbalized understanding and agrees with this plan.  Pt states he lower extremities today have looked the best they ever have.  Pt will keep [place] informed through her mychart account when she has to utilize using her PRN lasix.

## 2015-07-23 NOTE — Telephone Encounter (Signed)
Stacey from CT dept.  Called to let Dr. Sherene Sires know that patient's CT is up.  FYI to Dr. Sherene Sires

## 2015-07-24 ENCOUNTER — Other Ambulatory Visit: Payer: Self-pay | Admitting: Internal Medicine

## 2015-07-24 ENCOUNTER — Telehealth: Payer: Self-pay | Admitting: Internal Medicine

## 2015-07-24 DIAGNOSIS — S22000A Wedge compression fracture of unspecified thoracic vertebra, initial encounter for closed fracture: Secondary | ICD-10-CM

## 2015-07-24 NOTE — Progress Notes (Signed)
Quick Note:  Referral was made ______ 

## 2015-07-24 NOTE — Telephone Encounter (Signed)
LMTCB

## 2015-07-24 NOTE — Telephone Encounter (Signed)
The augmentin was for discolored sputum which, in the absence of pna on cxr, might just be bronchitis so rec  Take the prednsione but if breathing / coughing not rapidly improving then add on the augmentin too

## 2015-07-24 NOTE — Telephone Encounter (Signed)
Pt would like to know if she should take ABX and prednisone Rx since they now know she has a fracture/bone spur-referral has been made. Pt had both Rx's filled prior to CT being done. Pt aware that we will get response from MW and let her know.   Please advise. Thanks.

## 2015-07-25 NOTE — Telephone Encounter (Signed)
Spoke with the pt and notified of recs per MW  She verbalized understanding  Nothing further needed 

## 2015-07-26 ENCOUNTER — Encounter: Payer: Self-pay | Admitting: Internal Medicine

## 2015-07-26 DIAGNOSIS — M546 Pain in thoracic spine: Secondary | ICD-10-CM | POA: Diagnosis not present

## 2015-07-26 DIAGNOSIS — M545 Low back pain: Secondary | ICD-10-CM | POA: Diagnosis not present

## 2015-07-26 NOTE — Assessment & Plan Note (Addendum)
-   PFT's  06/04/15  FEV1 1.20 (67 % ) ratio 83  p 6 % improvement from saba with DLCO  80 % corrects to 132 % for alv volume      - Clinical dx based on response to symbicort  The proper method of use, as well as anticipated side effects, of a metered-dose inhaler are discussed and demonstrated to the patient. Improved effectiveness after extensive coaching during this visit to a level of approximately  75%   Acute exac ? Contributing to cough > mscp vs compression fx T4   rec rx with augmentin and Prednisone 10 mg take  4 each am x 2 days,   2 each am x 2 days,  1 each am x 2 days and stop   I had an extended discussion with the patient reviewing all relevant studies completed to date and  lasting 15 to 20 minutes of a 25 minute visit    Each maintenance medication was reviewed in detail including most importantly the difference between maintenance and prns and under what circumstances the prns are to be triggered using an action plan format that is not reflected in the computer generated alphabetically organized AVS.    Please see instructions for details which were reviewed in writing and the patient given a copy highlighting the part that I personally wrote and discussed at today's ov.

## 2015-07-26 NOTE — Assessment & Plan Note (Signed)
See CT chest 07/23/2015 > referred to orthopedics   rx ultram in meantime

## 2015-07-26 NOTE — Assessment & Plan Note (Signed)
pfts with erv 14% 06/04/15   Body mass index is 33.06   Lab Results  Component Value Date   TSH 0.30* 07/12/2015     Contributing to gerd tendency/ doe/reviewed the need and the process to achieve and maintain neg calorie balance > defer f/u primary care including intermittently monitoring thyroid status

## 2015-07-27 ENCOUNTER — Ambulatory Visit (INDEPENDENT_AMBULATORY_CARE_PROVIDER_SITE_OTHER): Payer: Medicare Other | Admitting: Internal Medicine

## 2015-07-27 ENCOUNTER — Encounter: Payer: Self-pay | Admitting: Internal Medicine

## 2015-07-27 VITALS — BP 122/70 | HR 87 | Temp 98.4°F | Resp 20 | Ht 59.5 in | Wt 167.0 lb

## 2015-07-27 DIAGNOSIS — I2510 Atherosclerotic heart disease of native coronary artery without angina pectoris: Secondary | ICD-10-CM | POA: Diagnosis not present

## 2015-07-27 DIAGNOSIS — J8410 Pulmonary fibrosis, unspecified: Secondary | ICD-10-CM | POA: Diagnosis not present

## 2015-07-27 DIAGNOSIS — I10 Essential (primary) hypertension: Secondary | ICD-10-CM

## 2015-07-27 DIAGNOSIS — Z951 Presence of aortocoronary bypass graft: Secondary | ICD-10-CM

## 2015-07-27 DIAGNOSIS — I5033 Acute on chronic diastolic (congestive) heart failure: Secondary | ICD-10-CM

## 2015-07-27 DIAGNOSIS — L4050 Arthropathic psoriasis, unspecified: Secondary | ICD-10-CM

## 2015-07-27 DIAGNOSIS — J9611 Chronic respiratory failure with hypoxia: Secondary | ICD-10-CM

## 2015-07-27 DIAGNOSIS — Z23 Encounter for immunization: Secondary | ICD-10-CM | POA: Diagnosis not present

## 2015-07-27 NOTE — Progress Notes (Signed)
Pre visit review using our clinic review tool, if applicable. No additional management support is needed unless otherwise documented below in the visit note. 

## 2015-07-27 NOTE — Patient Instructions (Signed)
Limit your sodium (Salt) intake  Pulmonary and cardiology follow-up  You need to lose weight.  Consider a lower calorie diet and regular exercise.  Return in 6 months for follow-up or as needed  Please check your blood pressure on a regular basis.  If it is consistently greater than 150/90, please make an office appointment.

## 2015-07-27 NOTE — Progress Notes (Signed)
Subjective:    Patient ID: [name], female    DOB: 04-06-1946, 69 y.o.   MRN: [id number]  HPI Lab Results  Component Value Date   HGBA1C 5.8* 05/31/2015   69 year old patient who is seen today for follow-up.  She is followed closely by pulmonary for pulmonary fibrosis with history of chronic respiratory failure. She is status post CABG approximately 6 weeks ago and has done remarkably well.  Her cardiopulmonary status is stable She has essential hypertension Hospital course, gated by mild stress hyperglycemia.  Hemoglobin A1c 5.8 Done quite well, although under stress due to the poor health of her husband  Past Medical History  Diagnosis Date  . Psoriatic arthritis   . Rheumatic disease   . Hypertension   . Hypothyroidism   . Barrett esophagus   . COPD (chronic obstructive pulmonary disease)   . Chronic respiratory failure   . Pulmonary fibrosis   . GERD (gastroesophageal reflux disease)   . SOB (shortness of breath) on exertion   . History of home oxygen therapy     AT NIGHT WHEN SLEEPING 2 L / MIN NASAL CANNULA  . History of ARDS 2006  . Pneumococcal pneumonia 2006    HOSPITALIZED AND DEVELOPED ARDS  . Arthritis     OA RIGHT KNEE WITH PAIN  . NSTEMI (non-ST elevated myocardial infarction) 05/31/2015    Social History   Social History  . Marital Status: Married    Spouse Name: N/A  . Number of Children: N/A  . Years of Education: N/A   Occupational History  . Floral Manager  [name]   Social History Main Topics  . Smoking status: Former Smoker -- 1.50 packs/day for 58.5 years    Types: Cigarettes    Quit date: 03/03/2005  . Smokeless tobacco: Never Used  . Alcohol Use: No  . Drug Use: No  . Sexual Activity: Not on file   Other Topics Concern  . Not on file   Social History Narrative    Past Surgical History  Procedure Laterality Date  . Carpal tunnel release    . Cosmetic breast surgery    . Abdominoplasty    . Cholecystectomy    .  Knee arthroscopy Left   . Total knee arthroplasty Left   . Joint replacement    . Total knee arthroplasty Right 06/06/2013    Procedure: RIGHT TOTAL KNEE ARTHROPLASTY;  Surgeon: Loanne Drilling, MD;  Location: [facility] ORS;  Service: Orthopedics;  Laterality: Right;  . Cardiac catheterization N/A 06/01/2015    Procedure: Left Heart Cath and Coronary Angiography;  Surgeon: Runell Gess, MD;  Location: [facility] INVASIVE CV LAB;  Service: Cardiovascular;  Laterality: N/A;  . Coronary artery bypass graft N/A 06/04/2015    Procedure: CORONARY ARTERY BYPASS GRAFT times three            with left internal mammary artery and right leg saphenous vein;  Surgeon: Alleen Borne, MD;  Location: [facility] OR;  Service: Open Heart Surgery;  Laterality: N/A;  . Tee without cardioversion  06/04/2015    Procedure: TRANSESOPHAGEAL ECHOCARDIOGRAM (TEE);  Surgeon: Alleen Borne, MD;  Location: [facility] OR;  Service: Open Heart Surgery;;    Family History  Problem Relation Age of Onset  . Coronary artery disease Father   . Breast cancer Mother   . Rheum arthritis Father     No Known Allergies  Current Outpatient Prescriptions on File Prior to Visit  Medication Sig Dispense Refill  .  acetaminophen (TYLENOL ARTHRITIS PAIN) 650 MG CR tablet Take 1,300 mg by mouth every 8 (eight) hours as needed for pain. Per bottle directions as needed    . aspirin EC 325 MG EC tablet Take 1 tablet (325 mg total) by mouth daily. 30 tablet 0  . atorvastatin (LIPITOR) 80 MG tablet Take 1 tablet (80 mg total) by mouth daily at 6 PM. 90 tablet 1  . budesonide-formoterol (SYMBICORT) 160-4.5 MCG/ACT inhaler Inhale 2 puffs into the lungs 2 (two) times daily. 3 Inhaler 3  . Cholecalciferol (VITAMIN D3) 2000 UNITS TABS Take 2,000 Int'l Units by mouth daily.    . citalopram (CELEXA) 20 MG tablet Take 1 tablet by mouth  every morning    . Ferrous Sulfate (IRON SUPPLEMENT PO) Take by mouth.    . furosemide (LASIX) 40 MG tablet Take 1 tablet (40 mg total) by mouth  daily as needed for edema. 30 tablet 1  . levothyroxine (SYNTHROID, LEVOTHROID) 125 MCG tablet Take 1 tablet (125 mcg total) by mouth daily before breakfast. 90 tablet 3  . LORazepam (ATIVAN) 0.5 MG tablet Take 1 tablet (0.5 mg total) by mouth 2 (two) times daily as needed for anxiety. 60 tablet 0  . losartan (COZAAR) 25 MG tablet Take 1 tablet (25 mg total) by mouth daily. 90 tablet 1  . omeprazole (PRILOSEC) 20 MG capsule Take 1 capsule (20 mg total) by mouth every morning. 90 capsule 1  . potassium chloride SA (K-DUR,KLOR-CON) 20 MEQ tablet Take 1 tablet (20 mEq total) by mouth daily. For 5 days then stop. (Patient taking differently: Take 20 mEq by mouth daily. )    . traMADol (ULTRAM) 50 MG tablet Take 1 tablet (50 mg total) by mouth every 4 (four) hours as needed. Take one or two every 6 hours PRN pain 30 tablet 0  . triamcinolone cream (KENALOG) 0.1 % Apply 1 application topically 2 (two) times daily as needed (for psorisis).    Marland Kitchen UNABLE TO FIND Med Name: Cimzia per Dr. Dierdre Forth     No current facility-administered medications on file prior to visit.    BP 122/70 mmHg  Pulse 87  Temp(Src) 98.4 F (36.9 C) (Oral)  Resp 20  Ht 4' 11.5" (1.511 m)  Wt 167 lb (75.751 kg)  BMI 33.18 kg/m2  SpO2 94%      Review of Systems  Constitutional: Negative.   HENT: Negative for congestion, dental problem, hearing loss, rhinorrhea, sinus pressure, sore throat and tinnitus.   Eyes: Negative for pain, discharge and visual disturbance.  Respiratory: Positive for shortness of breath. Negative for cough.   Cardiovascular: Negative for chest pain, palpitations and leg swelling.  Gastrointestinal: Negative for nausea, vomiting, abdominal pain, diarrhea, constipation, blood in stool and abdominal distention.  Genitourinary: Negative for dysuria, urgency, frequency, hematuria, flank pain, vaginal bleeding, vaginal discharge, difficulty urinating, vaginal pain and pelvic pain.  Musculoskeletal:  Negative for joint swelling, arthralgias and gait problem.  Skin: Negative for rash.  Neurological: Negative for dizziness, syncope, speech difficulty, weakness, numbness and headaches.  Hematological: Negative for adenopathy.  Psychiatric/Behavioral: Negative for behavioral problems, dysphoric mood and agitation. The patient is nervous/anxious.        Objective:   Physical Exam  Constitutional: She is oriented to person, place, and time. She appears well-developed and well-nourished. No distress.  Blood pressure low normal No distress Transfers easily from a sitting position to the examining table without assistance  Room air O2 saturation 94% No tachycardia  HENT:  Head: Normocephalic.  Right Ear: External ear normal.  Left Ear: External ear normal.  Mouth/Throat: Oropharynx is clear and moist.  Eyes: Conjunctivae and EOM are normal. Pupils are equal, round, and reactive to light.  Neck: Normal range of motion. Neck supple. No thyromegaly present.  Cardiovascular: Normal rate, regular rhythm, normal heart sounds and intact distal pulses.   Pedal pulses full  Pulmonary/Chest: Effort normal. No respiratory distress. She has no wheezes. She has rales.  Status post sternotomy  Abdominal: Soft. Bowel sounds are normal. She exhibits no mass. There is no tenderness.  Musculoskeletal: Normal range of motion. She exhibits no edema.  Lymphadenopathy:    She has no cervical adenopathy.  Neurological: She is alert and oriented to person, place, and time.  Skin: Skin is warm and dry. No rash noted.  Psychiatric: She has a normal mood and affect. Her behavior is normal.          Assessment & Plan:   Status post CABG Pulmonary fibrosis.  Follow-up on memory medicine Essential hypertension, stable History of stress hyperglycemia.  Normal hemoglobin A1c.  We'll recheck in 6 months Situational stress due to the poor health of her husband

## 2015-07-30 DIAGNOSIS — L409 Psoriasis, unspecified: Secondary | ICD-10-CM | POA: Diagnosis not present

## 2015-07-30 DIAGNOSIS — Z79899 Other long term (current) drug therapy: Secondary | ICD-10-CM | POA: Diagnosis not present

## 2015-07-30 DIAGNOSIS — M0589 Other rheumatoid arthritis with rheumatoid factor of multiple sites: Secondary | ICD-10-CM | POA: Diagnosis not present

## 2015-07-30 DIAGNOSIS — M150 Primary generalized (osteo)arthritis: Secondary | ICD-10-CM | POA: Diagnosis not present

## 2015-07-30 DIAGNOSIS — I252 Old myocardial infarction: Secondary | ICD-10-CM | POA: Diagnosis not present

## 2015-08-17 ENCOUNTER — Ambulatory Visit: Payer: Medicare Other | Admitting: Gastroenterology

## 2015-08-23 ENCOUNTER — Telehealth: Payer: Self-pay | Admitting: Cardiology

## 2015-08-23 NOTE — Telephone Encounter (Signed)
New problem    Pt will be bringing a slip for her handicap placard today, she wanted you to look out for it.

## 2015-08-23 NOTE — Telephone Encounter (Signed)
Pt calling to inform [name] and myself that she would be coming by the office today to drop off her handicap placard for [name] to sign.  Informed the pt that would be fine. Advised her to give it to the greeter at the front desk, and she will fill out a pt walk-in form attached with her handicap form, and will have it delivered to myself and [name] to review and sign. Informed the pt that once [name] fills this completely out, I will contact her to come and pick this up from the front desk.  Pt verbalized understanding and agrees with this plan.

## 2015-08-24 ENCOUNTER — Telehealth: Payer: Self-pay | Admitting: Cardiology

## 2015-08-24 NOTE — Telephone Encounter (Signed)
New problem    Pt want to know if her form for handicapp placard is ready for pick up.

## 2015-08-24 NOTE — Telephone Encounter (Signed)
Lm to call back

## 2015-08-24 NOTE — Telephone Encounter (Signed)
Walk in pt form-Renewal of Disability Placard-paper dropped off Ivy back Monday (10/24)

## 2015-08-24 NOTE — Telephone Encounter (Signed)
We are not in the office today, but will get it to her on Monday. Please call her to let her know.

## 2015-08-24 NOTE — Telephone Encounter (Signed)
Calling wanting to know if Handicap placard had been signed.  States she left the card with the greeter yesterday at 3:00.  Looked at front desk and was not there.  Advised pt that would forward to Dr. Delton See and Aggie Hacker who will be back next week.

## 2015-08-27 NOTE — Telephone Encounter (Signed)
Pt calling to ask if [name] has signed her handicap placard yet.  Pt reports she will need this by tomorrow sometime, for she will be going out of the [place] on this Wednesday and will need this.  Informed the pt that [name] will try to come by the office tomorrow and sign these papers, and once she does, I will contact her back to inform her to pick them up.  Pt verbalized understanding and agrees with this plan.  Will route this message to [name] as an Lorain Childes.

## 2015-08-27 NOTE — Telephone Encounter (Signed)
F/u   Pt want to know if her handicap form is ready to be picked up. Please call pt.

## 2015-08-28 DIAGNOSIS — M0589 Other rheumatoid arthritis with rheumatoid factor of multiple sites: Secondary | ICD-10-CM | POA: Diagnosis not present

## 2015-08-28 NOTE — Telephone Encounter (Signed)
Notified the pt that her handicap placard is at the front desk available for her to pick-up.  Pt verbalized understanding and gracious for all the assistance provided.

## 2015-09-04 ENCOUNTER — Other Ambulatory Visit (HOSPITAL_COMMUNITY): Payer: Medicare Other

## 2015-09-07 ENCOUNTER — Other Ambulatory Visit: Payer: Self-pay

## 2015-09-07 ENCOUNTER — Ambulatory Visit (HOSPITAL_COMMUNITY): Payer: Medicare Other | Attending: Cardiology

## 2015-09-07 DIAGNOSIS — E669 Obesity, unspecified: Secondary | ICD-10-CM | POA: Insufficient documentation

## 2015-09-07 DIAGNOSIS — Z951 Presence of aortocoronary bypass graft: Secondary | ICD-10-CM | POA: Diagnosis not present

## 2015-09-07 DIAGNOSIS — Z6833 Body mass index (BMI) 33.0-33.9, adult: Secondary | ICD-10-CM | POA: Diagnosis not present

## 2015-09-07 DIAGNOSIS — I071 Rheumatic tricuspid insufficiency: Secondary | ICD-10-CM | POA: Insufficient documentation

## 2015-09-07 DIAGNOSIS — I5033 Acute on chronic diastolic (congestive) heart failure: Secondary | ICD-10-CM

## 2015-09-07 DIAGNOSIS — I10 Essential (primary) hypertension: Secondary | ICD-10-CM | POA: Diagnosis not present

## 2015-09-07 DIAGNOSIS — I517 Cardiomegaly: Secondary | ICD-10-CM | POA: Diagnosis not present

## 2015-09-07 DIAGNOSIS — E785 Hyperlipidemia, unspecified: Secondary | ICD-10-CM

## 2015-09-07 DIAGNOSIS — Z87891 Personal history of nicotine dependence: Secondary | ICD-10-CM | POA: Diagnosis not present

## 2015-09-11 ENCOUNTER — Other Ambulatory Visit (HOSPITAL_COMMUNITY): Payer: Medicare Other

## 2015-09-23 IMAGING — CT CT ANGIO CHEST
2 of 6 series · 19 of 36 positions shown · IV contrast (Omnipaque 300)
Comparison: 12/03/2010

CLINICAL DATA: Right shoulder blade pain with deep inspiration.

EXAM:
CT ANGIOGRAPHY CHEST WITH CONTRAST
TECHNIQUE: Multidetector CT imaging of the chest was performed using the
standard protocol during bolus administration of intravenous
contrast. Multiplanar CT image reconstructions and MIPs were
obtained to evaluate the vascular anatomy.
CONTRAST:  80mL OMNIPAQUE IOHEXOL 350 MG/ML SOLN

[Series 5: thins 1.0mm / 0.8mm · axial · 0.72mm/px · z∈[-274,-36]mm · 18 of 266 slices shown]
[im 14/266  lung]
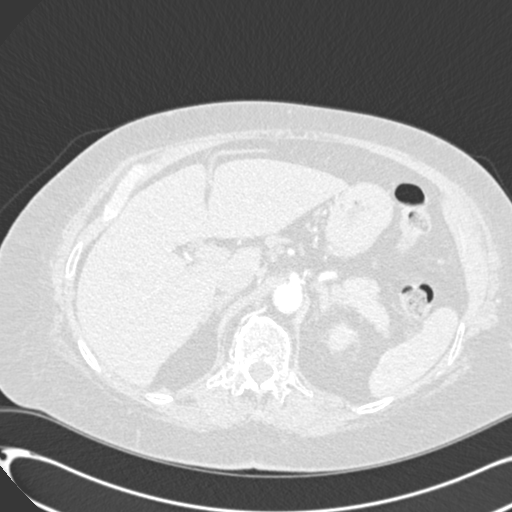
[im 27/266  mediastinal]
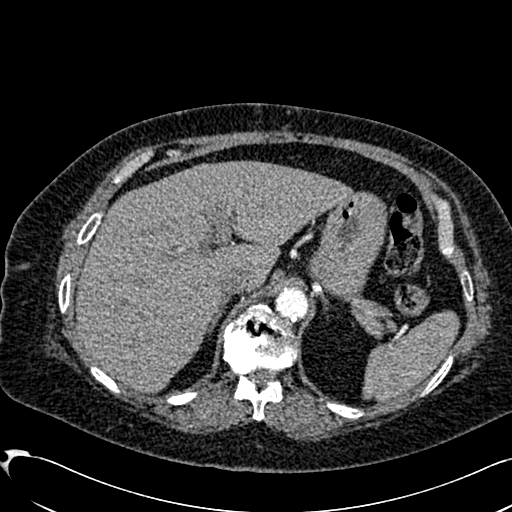
[im 40/266  lung]
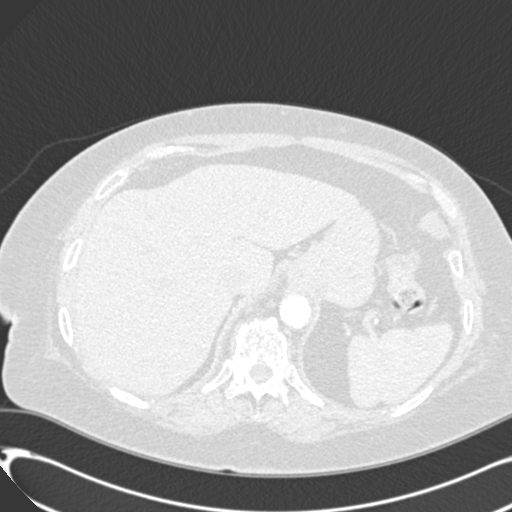
[im 54/266  mediastinal]
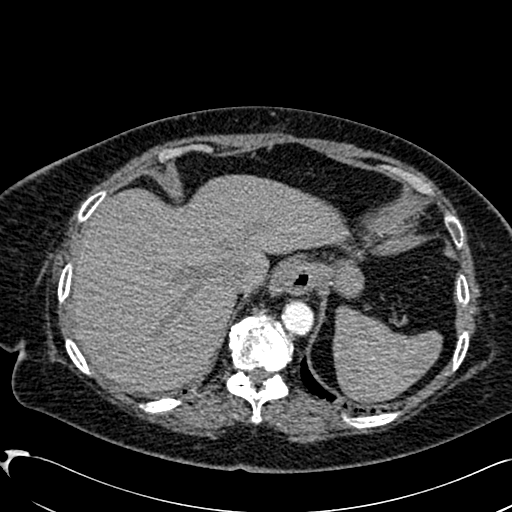
[im 67/266  lung]
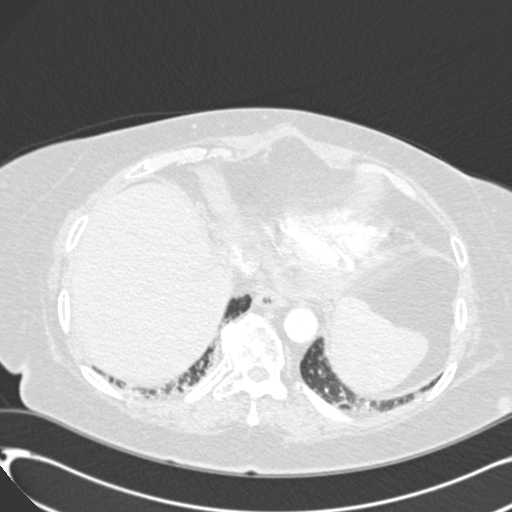
[im 80/266  mediastinal]
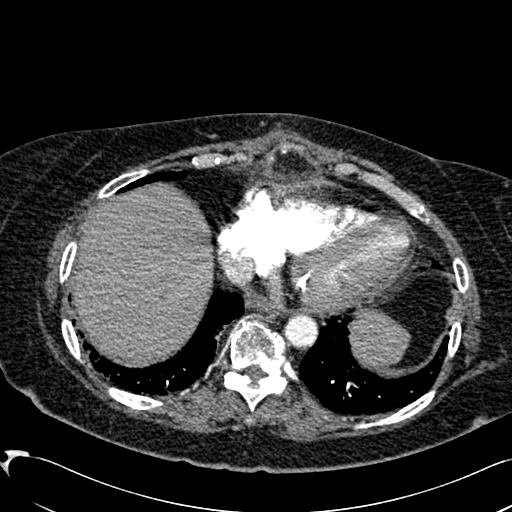
[im 93/266  lung]
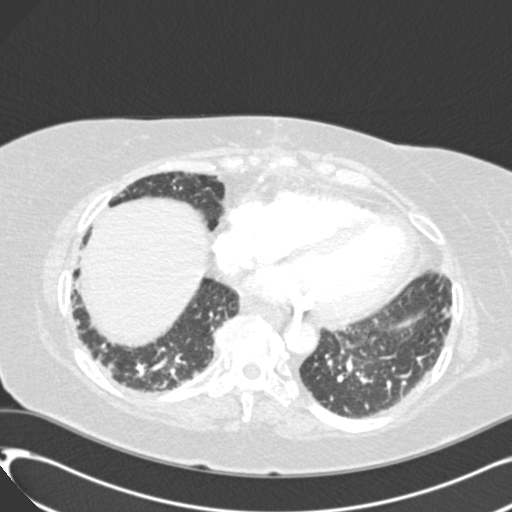
[im 107/266  mediastinal]
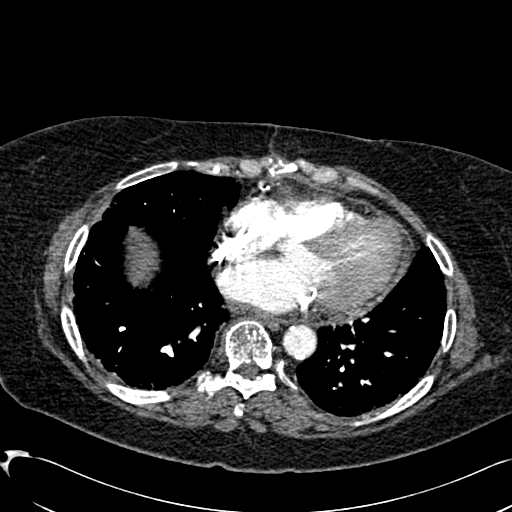
[im 120/266  lung]
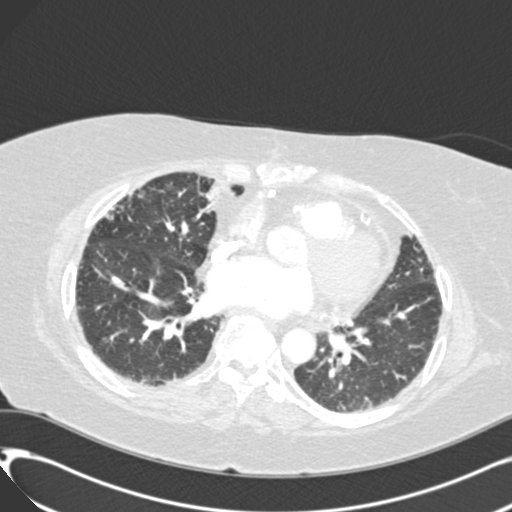
[im 146/266  mediastinal]
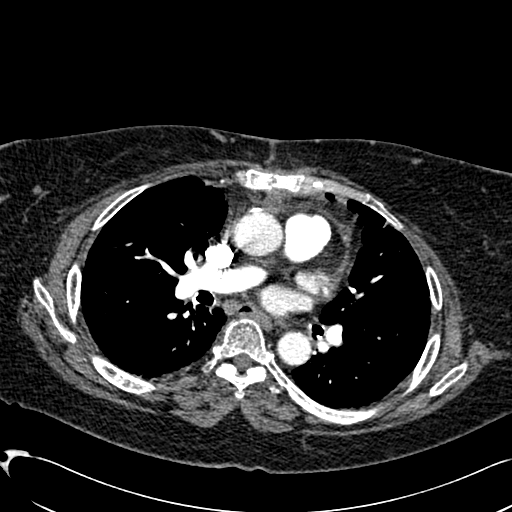
[im 160/266  lung]
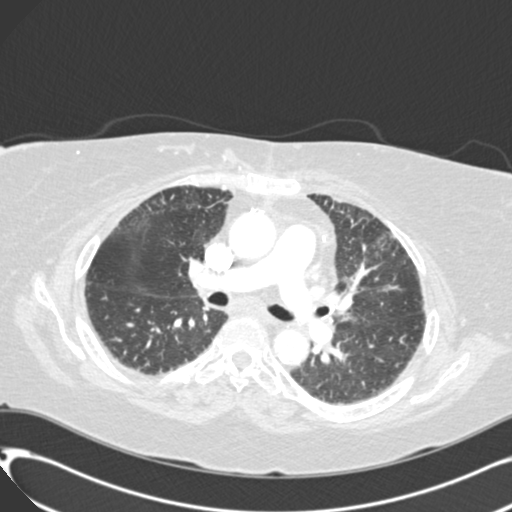
[im 173/266  mediastinal]
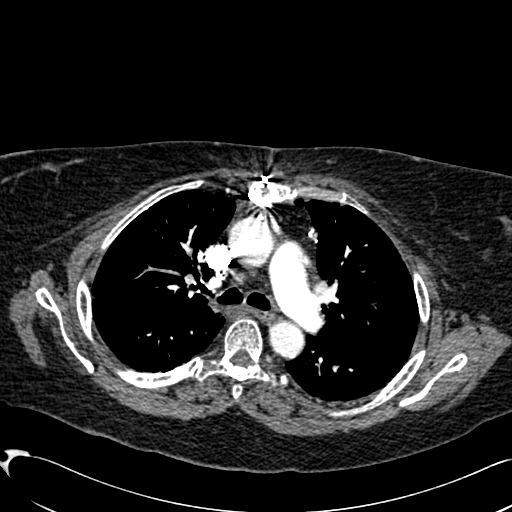
[im 186/266  lung]
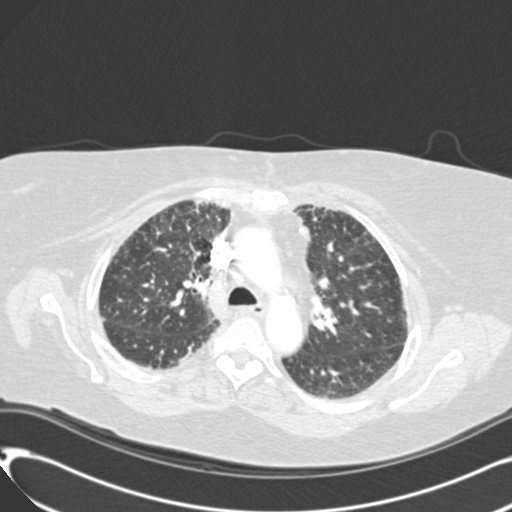
[im 199/266  mediastinal]
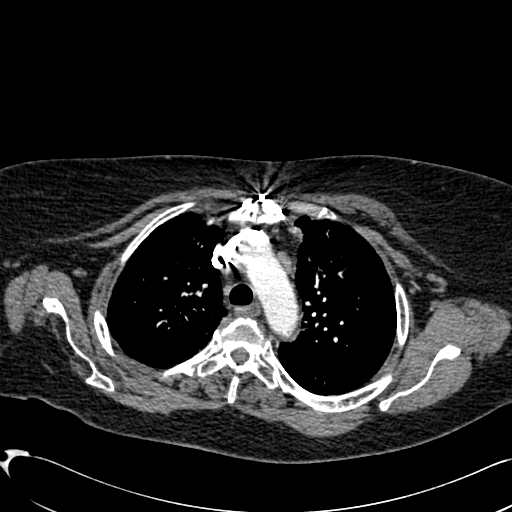
[im 213/266  lung]
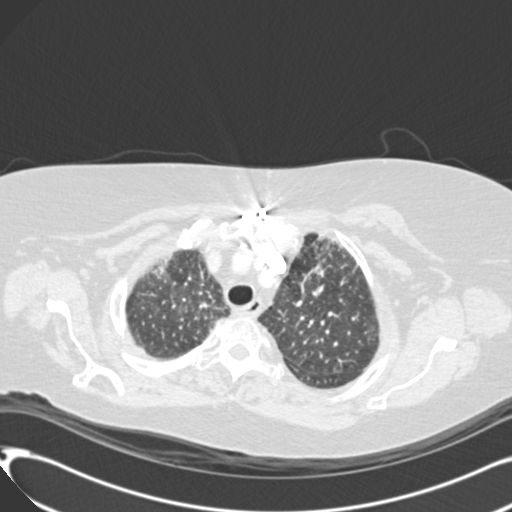
[im 226/266  mediastinal]
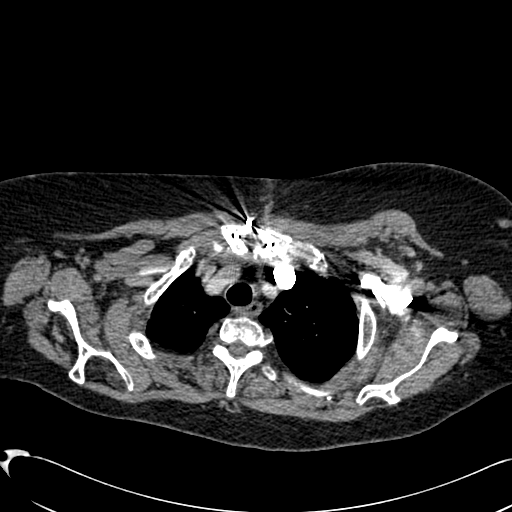
[im 239/266  lung]
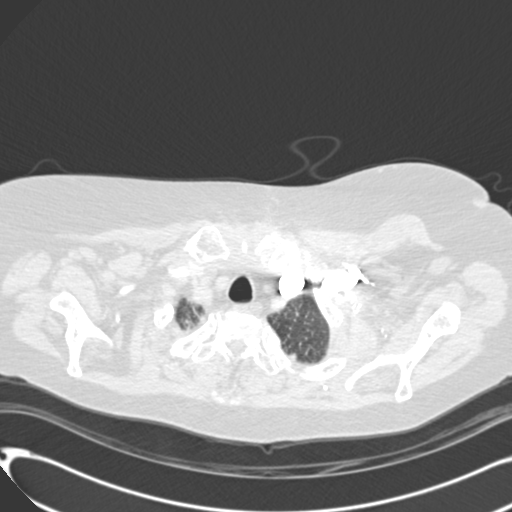
[im 252/266  mediastinal]
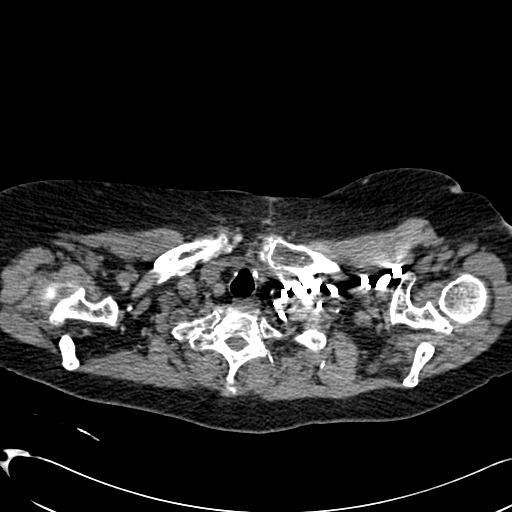

[Series 602: cor mpr · coronal · 0.72mm/px · 1 of 102 slices shown]
[im 51/102  mediastinal]
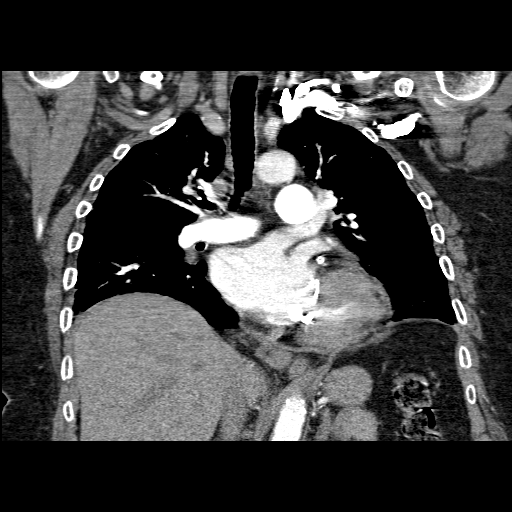

[19 of 36 positions shown; findings below may reference images not displayed]

FINDINGS: There is adequate opacification of the pulmonary arteries. There is
no pulmonary embolus. The main pulmonary artery, right main
pulmonary artery and left main pulmonary arteries are normal in
size. The heart size is normal. There is no pericardial effusion.
There is evidence of prior CABG.

There is no pleural effusion or pneumothorax. There is bilateral
intralobular and interlobular septal thickening.

There is a mildly enlarged right lower paratracheal lymph node
measuring 13 mm in short axis. There is a prominent, non
pathologically enlarged prevascular lymph node.

There is no lytic or blastic osseous lesion. There is a calcified
disc protrusion at T7-8 impressing on the thecal sac. There is a
calcified disc protrusion at T4-5. There is an age-indeterminate T4
vertebral body compression fracture.

The visualized portions of the upper abdomen are unremarkable.

Review of the MIP images confirms the above findings.
IMPRESSION: 1. No evidence of pulmonary embolus.
2. Mild chronic interstitial lung disease.
3. There is an age-indeterminate T4 vertebral body compression
fracture.

## 2015-09-25 ENCOUNTER — Ambulatory Visit (INDEPENDENT_AMBULATORY_CARE_PROVIDER_SITE_OTHER): Payer: Medicare Other | Admitting: Cardiology

## 2015-09-25 ENCOUNTER — Encounter: Payer: Self-pay | Admitting: Cardiology

## 2015-09-25 VITALS — BP 154/70 | HR 96 | Ht 59.5 in | Wt 170.0 lb

## 2015-09-25 DIAGNOSIS — I2510 Atherosclerotic heart disease of native coronary artery without angina pectoris: Secondary | ICD-10-CM

## 2015-09-25 DIAGNOSIS — Z951 Presence of aortocoronary bypass graft: Secondary | ICD-10-CM

## 2015-09-25 DIAGNOSIS — I5033 Acute on chronic diastolic (congestive) heart failure: Secondary | ICD-10-CM

## 2015-09-25 DIAGNOSIS — I10 Essential (primary) hypertension: Secondary | ICD-10-CM | POA: Diagnosis not present

## 2015-09-25 DIAGNOSIS — D62 Acute posthemorrhagic anemia: Secondary | ICD-10-CM

## 2015-09-25 DIAGNOSIS — J209 Acute bronchitis, unspecified: Secondary | ICD-10-CM | POA: Diagnosis not present

## 2015-09-25 HISTORY — DX: Acute bronchitis, unspecified: J20.9

## 2015-09-25 LAB — CBC WITH DIFFERENTIAL/PLATELET
Basophils Absolute: 0.1 10*3/uL (ref 0.0–0.1)
Basophils Relative: 1 % (ref 0–1)
Eosinophils Absolute: 0.5 10*3/uL (ref 0.0–0.7)
Eosinophils Relative: 6 % — ABNORMAL HIGH (ref 0–5)
HCT: 39.7 % (ref 36.0–46.0)
Hemoglobin: 12.3 g/dL (ref 12.0–15.0)
Lymphocytes Relative: 25 % (ref 12–46)
Lymphs Abs: 1.9 10*3/uL (ref 0.7–4.0)
MCH: 26.8 pg (ref 26.0–34.0)
MCHC: 31 g/dL (ref 30.0–36.0)
MCV: 86.5 fL (ref 78.0–100.0)
MPV: 9.4 fL (ref 8.6–12.4)
Monocytes Absolute: 0.8 10*3/uL (ref 0.1–1.0)
Monocytes Relative: 11 % (ref 3–12)
Neutro Abs: 4.4 10*3/uL (ref 1.7–7.7)
Neutrophils Relative %: 57 % (ref 43–77)
Platelets: 215 10*3/uL (ref 150–400)
RBC: 4.59 MIL/uL (ref 3.87–5.11)
RDW: 15.3 % (ref 11.5–15.5)
WBC: 7.7 10*3/uL (ref 4.0–10.5)

## 2015-09-25 LAB — BASIC METABOLIC PANEL
BUN: 21 mg/dL (ref 7–25)
CO2: 32 mmol/L — ABNORMAL HIGH (ref 20–31)
Calcium: 9.3 mg/dL (ref 8.6–10.4)
Chloride: 98 mmol/L (ref 98–110)
Creat: 0.7 mg/dL (ref 0.50–0.99)
Glucose, Bld: 79 mg/dL (ref 65–99)
Potassium: 4.1 mmol/L (ref 3.5–5.3)
Sodium: 137 mmol/L (ref 135–146)

## 2015-09-25 LAB — TSH: TSH: 12.493 u[IU]/mL — ABNORMAL HIGH (ref 0.350–4.500)

## 2015-09-25 MED ORDER — LOSARTAN POTASSIUM 50 MG PO TABS: 50.0000 mg | ORAL_TABLET | Freq: Every day | ORAL | Status: DC

## 2015-09-25 NOTE — Patient Instructions (Signed)
Medication Instructions:   [name] HAS HAND WRITTEN A PRESCRIPTION FOR YOU TO TAKE Z-PAK EXACTLY AS INSTRUCTED ON THE PACK FOR ACUTE BRONCHITIS  INCREASE YOUR LOSARTAN TO 50 MG ONCE DAILY    Labwork:  TODAY---BMET, CBC W DIFF, AND TSH    Follow-Up:  AT DR NELSON'S NEXT AVAILABLE APPOINMENT    If you need a refill on your cardiac medications before your next appointment, please call your pharmacy.

## 2015-09-25 NOTE — Progress Notes (Signed)
Patient ID: [name], female   DOB: [birth year], 69 y.o.   MRN: [id number]      Cardiology Office Note   Date:  09/25/2015   ID:  [name], DOB [birth year], MRN [id number]  PCP:  Rogelia Boga, MD  Cardiologist: Lars Masson, MD   CC:    History of Present Illness: [name] is a 69 y.o. female who presents for post hospitalization visit after NSTEMI--> 3 VD --> emergent CABG in July 2016. She is mourning as her husband just passed away. She denies chest pain, SOB, no muscle pain, na palpitations, syncope, no LE edema, orthopnea, PND.  She has developed cough productive of yellowish sputum about a week ago. No fever. She has been wheezing. 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    Past Medical History  Diagnosis Date  . Psoriatic arthritis (HCC)   . Rheumatic disease   . Hypertension   . Hypothyroidism   . Barrett esophagus   . COPD (chronic obstructive pulmonary disease) (HCC)   . Chronic respiratory failure (HCC)   . Pulmonary fibrosis (HCC)   . GERD (gastroesophageal reflux disease)   . SOB (shortness of breath) on exertion   . History of home oxygen therapy     AT NIGHT WHEN SLEEPING 2 L / MIN NASAL CANNULA  . History of ARDS 2006  . Pneumococcal pneumonia (HCC) 2006    HOSPITALIZED AND DEVELOPED ARDS  . Arthritis     OA RIGHT KNEE WITH PAIN  . NSTEMI (non-ST elevated myocardial infarction) (HCC) 05/31/2015    Past Surgical History  Procedure Laterality Date  . Carpal tunnel release    . Cosmetic breast surgery    . Abdominoplasty    . Cholecystectomy    . Knee arthroscopy Left   . Total knee arthroplasty Left   . Joint replacement    . Total knee arthroplasty Right 06/06/2013    Procedure: RIGHT TOTAL KNEE ARTHROPLASTY;  Surgeon: Loanne Drilling, MD;  Location: [facility];  Service: Orthopedics;  Laterality: Right;  . Cardiac catheterization N/A 06/01/2015    Procedure: Left Heart Cath and Coronary Angiography;  Surgeon: Runell Gess, MD;  Location: [facility] INVASIVE CV LAB;  Service: Cardiovascular;  Laterality: N/A;  . Coronary artery bypass graft N/A 06/04/2015    Procedure: CORONARY ARTERY BYPASS GRAFT times three            with left internal mammary artery and right leg saphenous vein;  Surgeon: Alleen Borne, MD;  Location: [facility] OR;  Service: Open Heart Surgery;  Laterality: N/A;  . Tee without cardioversion  06/04/2015    Procedure: TRANSESOPHAGEAL ECHOCARDIOGRAM (TEE);  Surgeon: Alleen Borne, MD;  Location: [facility];  Service: Open Heart Surgery;;   Current Outpatient Prescriptions  Medication Sig Dispense Refill  . acetaminophen (TYLENOL ARTHRITIS PAIN) 650 MG CR tablet Take 1,300 mg by mouth every 8 (eight) hours as needed for pain. Per bottle directions as needed    . aspirin EC 325 MG EC tablet Take 1 tablet (325 mg total) by mouth daily. 30 tablet 0  . atorvastatin (LIPITOR) 80 MG tablet Take 1 tablet (80 mg total) by mouth daily at 6 PM. 90 tablet 1  . budesonide-formoterol (SYMBICORT) 160-4.5 MCG/ACT inhaler Inhale 2 puffs into the lungs 2 (two) times daily. 3 Inhaler 3  . Certolizumab Pegol (CIMZIA [place]) Inject into the skin as directed.    . Cholecalciferol (VITAMIN D3) 2000 UNITS TABS Take  2,000 Int'l Units by mouth daily.    . citalopram (CELEXA) 20 MG tablet Take 1 tablet by mouth  every morning    . Ferrous Sulfate (IRON SUPPLEMENT PO) Take by mouth.    . levothyroxine (SYNTHROID, LEVOTHROID) 125 MCG tablet Take 1 tablet (125 mcg total) by mouth daily before breakfast. 90 tablet 3  . losartan (COZAAR) 25 MG tablet Take 1 tablet (25 mg total) by mouth daily. 90 tablet 1  . omeprazole (PRILOSEC) 20 MG capsule Take 1 capsule (20 mg total) by mouth every morning. 90 capsule 1  . traMADol (ULTRAM) 50 MG tablet Take 1 tablet (50 mg total) by mouth every 4 (four) hours as needed. Take one or two every 6 hours PRN pain 30 tablet 0  . triamcinolone cream (KENALOG) 0.1 % Apply 1 application topically 2 (two) times daily  as needed (for psorisis).     No current facility-administered medications for this visit.   Allergies:   Review of patient's allergies indicates no known allergies.   Social History:  The patient  reports that she quit smoking about 10 years ago. Her smoking use included Cigarettes. She has a 87.75 pack-year smoking history. She has never used smokeless tobacco. She reports that she does not drink alcohol or use illicit drugs.   Family History:  The patient's family history includes Breast cancer in her mother; Coronary artery disease in her father; Rheum arthritis in her father.   ROS:  Please see the history of present illness.  All other systems are reviewed and negative.   PHYSICAL EXAM: VS:  BP 154/70 mmHg  Pulse 96  Ht 4' 11.5" (1.511 m)  Wt 170 lb (77.111 kg)  BMI 33.77 kg/m2 , BMI Body mass index is 33.77 kg/(m^2). GEN: Well nourished, well developed, in no acute distress HEENT: normal Neck: no JVD, carotid bruits, or masses Cardiac: RRR; no 3/6 systolic murmur, also rub, no gallops , mild LE edema R>L  Respiratory:  Wheezing B/L, normal work of breathing GI: soft, nontender, nondistended, + BS MS: no deformity or atrophy Skin: warm and dry, no rash Neuro:  Strength and sensation are intact Psych: euthymic mood, full affect  EKG:  SRLAE, iRBBB, anterior MI age undetermined  Recent Labs: 06/05/2015: Magnesium 2.0 07/12/2015: ALT 15; BUN 24*; Creatinine, Ser 0.88; Hemoglobin 9.9*; Platelets 274.0; Potassium 3.6; Sodium 141; TSH 0.30*   Lipid Panel    Component Value Date/Time   CHOL 117 07/12/2015 1508   CHOL 149 06/01/2015 0400   TRIG 75 07/12/2015 1508   TRIG 126 06/01/2015 0400   HDL 51 07/12/2015 1508   HDL 34* 06/01/2015 0400   CHOLHDL 4.4 06/01/2015 0400   VLDL 25 06/01/2015 0400   LDLCALC 51 07/12/2015 1508   LDLCALC 90 06/01/2015 0400   LDLDIRECT 131.1 08/10/2012 0829   Wt Readings from Last 3 Encounters:  09/25/15 170 lb (77.111 kg)  07/27/15 167 lb  (75.751 kg)  07/23/15 166 lb 6.4 oz (75.479 kg)    ECHO: 06/01/2015 - Left ventricle: The cavity size was normal. Wall thickness was increased in a pattern of mild LVH. Systolic function was normal. The estimated ejection fraction was in the range of 55% to 60%. There is hypokinesis of the basalinferolateral myocardium. Doppler parameters are consistent with abnormal left ventricular relaxation (grade 1 diastolic dysfunction). - Mitral valve: Calcified annulus. Mildly thickened leaflets .  Impressions: - Hypokinesis of the basal/mild inferior lateral wall with overall preserved LV function; grade 1 diastolic dysfunction; mild  LVH; trace MR.  TELEMETRY:  SR  TTE: 06/01/15 Study Conclusions  - Left ventricle: The cavity size was normal. Wall thickness was increased in a pattern of mild LVH. Systolic function was normal. The estimated ejection fraction was in the range of 55% to 60%. There is hypokinesis of the basalinferolateral myocardium. Doppler parameters are consistent with abnormal left ventricular relaxation (grade 1 diastolic dysfunction). - Mitral valve: Calcified annulus. Mildly thickened leaflets .  Impressions:  - Hypokinesis of the basal/mild inferior lateral wall with overall preserved LV function; grade 1 diastolic dysfunction; mild LVH; trace MR.  TTE: 09/07/2015 - Left ventricle: The cavity size was normal. There was moderate concentric hypertrophy. Systolic function was normal. The estimated ejection fraction was in the range of 60% to 65%. Wall motion was normal; there were no regional wall motion abnormalities. Doppler parameters are consistent with abnormal left ventricular relaxation (grade 1 diastolic dysfunction). There was no evidence of elevated ventricular filling pressure by Doppler parameters. - Aortic valve: There was no regurgitation. - Aortic root: The aortic root was normal in size. - Mitral valve:  Calcified annulus. Mildly thickened leaflets . - Left atrium: The atrium was mildly dilated. - Right ventricle: The cavity size was normal. Wall thickness was normal. Systolic function was normal. - Right atrium: The atrium was normal in size. - Tricuspid valve: There was trivial regurgitation. - Pulmonic valve: There was no regurgitation. - Pulmonary arteries: Systolic pressure was within the normal range. - Inferior vena cava: The vessel was normal in size. - Pericardium, extracardiac: There was no pericardial effusion.  Impressions:  - There is mild basal, mid inferior wall hypokinesis, overall LVEF normal.   ASSESSMENT AND PLAN:  1. NSTEMI (non-ST elevated myocardial infarction), 3-VD with preserved LV function. Repeat echo showed LVEF 60-65%. On aspirin and atorvastatin, losartan, metoprolol discontinued as she was bradycardic.  She should technically be on Plavix as she had NSTEMI, we will start if Hb normalized.  2. HTN - uncontrolled, increase losartan to 50 mg po daily.   3. Hyperlipidemia - on atorvastatin, no side effects  4. Hypothyroidism     Her TSH is low at 0.1. T4 is high. She is on 200 g of thyroid replacement daily. It would appear that she should be on a lower dose. I will lower her dose to 150 g. This will need careful follow-up as an outpatient.  5. Anemia - post CABG, the last 9.1, on Fe supplements, I will recheck today, if > 11, discontinue FeS.   6. Acute bronchitis - start Z-pack  Labs today: CBC, BMP, TSH.  Follow up in 6 weeks.   Signed, Lars Masson, MD  09/25/2015 2:30 PM    [facility] [address] [place], [place], [place]  [address] Phone: [phone]; Fax: [phone]

## 2015-09-26 ENCOUNTER — Telehealth: Payer: Self-pay | Admitting: *Deleted

## 2015-09-26 DIAGNOSIS — R7989 Other specified abnormal findings of blood chemistry: Secondary | ICD-10-CM | POA: Insufficient documentation

## 2015-09-26 DIAGNOSIS — E038 Other specified hypothyroidism: Secondary | ICD-10-CM

## 2015-09-26 MED ORDER — LEVOTHYROXINE SODIUM 137 MCG PO TABS: 137.0000 ug | ORAL_TABLET | Freq: Every day | ORAL | Status: DC

## 2015-09-26 NOTE — Telephone Encounter (Signed)
Noted that pts lab from yesterday's OV with [name] showed that her TSH level is up from 2 months ago at 0.30 and currently is 12.493.  Showed the DOD Flex Norma Fredrickson the pts elevated TSH level, and per Lawson Fiscal NP she recommends that we change the dose of the pts levothyroxine to 137 mcg po daily and have a repeat TSH in 6 weeks.  Pt states she is already coming into see [name] on 11/09/15 for a follow-up appt, and she request the lab to be done same day.  Informed the pt that would be perfect timing, for that would be right at 6 weeks.  Confirmed the pharmacy of choice with the pt.  Lab appt made to recheck TSH for 11/09/15 at our office.  Pt verbalized understanding and agrees with this plan.  Will also route this message to [name] to make her aware of changes made.

## 2015-10-02 ENCOUNTER — Encounter: Payer: Self-pay | Admitting: Internal Medicine

## 2015-10-03 ENCOUNTER — Other Ambulatory Visit: Payer: Self-pay | Admitting: Internal Medicine

## 2015-10-03 MED ORDER — FLUCONAZOLE 150 MG PO TABS: ORAL_TABLET | ORAL | Status: DC

## 2015-10-05 ENCOUNTER — Telehealth: Payer: Self-pay | Admitting: Internal Medicine

## 2015-10-05 MED ORDER — FLUCONAZOLE 150 MG PO TABS: ORAL_TABLET | ORAL | Status: DC

## 2015-10-05 NOTE — Telephone Encounter (Signed)
Pt needs fluconazole 150 mg send to [facility] not [facility]

## 2015-10-05 NOTE — Telephone Encounter (Signed)
Pt notified Rx sent to pharmacy. Pt verbalized understanding. 

## 2015-10-08 DIAGNOSIS — M0589 Other rheumatoid arthritis with rheumatoid factor of multiple sites: Secondary | ICD-10-CM | POA: Diagnosis not present

## 2015-10-10 DIAGNOSIS — L820 Inflamed seborrheic keratosis: Secondary | ICD-10-CM | POA: Diagnosis not present

## 2015-10-10 DIAGNOSIS — L400 Psoriasis vulgaris: Secondary | ICD-10-CM | POA: Diagnosis not present

## 2015-10-10 DIAGNOSIS — L304 Erythema intertrigo: Secondary | ICD-10-CM | POA: Diagnosis not present

## 2015-10-30 DIAGNOSIS — I252 Old myocardial infarction: Secondary | ICD-10-CM | POA: Diagnosis not present

## 2015-10-30 DIAGNOSIS — L409 Psoriasis, unspecified: Secondary | ICD-10-CM | POA: Diagnosis not present

## 2015-10-30 DIAGNOSIS — Z79899 Other long term (current) drug therapy: Secondary | ICD-10-CM | POA: Diagnosis not present

## 2015-10-30 DIAGNOSIS — M0589 Other rheumatoid arthritis with rheumatoid factor of multiple sites: Secondary | ICD-10-CM | POA: Diagnosis not present

## 2015-10-30 DIAGNOSIS — M150 Primary generalized (osteo)arthritis: Secondary | ICD-10-CM | POA: Diagnosis not present

## 2015-10-31 ENCOUNTER — Encounter: Payer: Self-pay | Admitting: *Deleted

## 2015-11-06 DIAGNOSIS — M0589 Other rheumatoid arthritis with rheumatoid factor of multiple sites: Secondary | ICD-10-CM | POA: Diagnosis not present

## 2015-11-09 ENCOUNTER — Other Ambulatory Visit (INDEPENDENT_AMBULATORY_CARE_PROVIDER_SITE_OTHER): Payer: Medicare Other | Admitting: *Deleted

## 2015-11-09 ENCOUNTER — Ambulatory Visit (INDEPENDENT_AMBULATORY_CARE_PROVIDER_SITE_OTHER): Payer: Medicare Other | Admitting: Cardiology

## 2015-11-09 ENCOUNTER — Encounter: Payer: Self-pay | Admitting: Cardiology

## 2015-11-09 VITALS — BP 130/70 | HR 84 | Ht 59.5 in | Wt 183.0 lb

## 2015-11-09 DIAGNOSIS — R946 Abnormal results of thyroid function studies: Secondary | ICD-10-CM

## 2015-11-09 DIAGNOSIS — R7989 Other specified abnormal findings of blood chemistry: Secondary | ICD-10-CM

## 2015-11-09 DIAGNOSIS — E032 Hypothyroidism due to medicaments and other exogenous substances: Secondary | ICD-10-CM

## 2015-11-09 DIAGNOSIS — I214 Non-ST elevation (NSTEMI) myocardial infarction: Secondary | ICD-10-CM | POA: Diagnosis not present

## 2015-11-09 DIAGNOSIS — E785 Hyperlipidemia, unspecified: Secondary | ICD-10-CM | POA: Diagnosis not present

## 2015-11-09 DIAGNOSIS — I10 Essential (primary) hypertension: Secondary | ICD-10-CM

## 2015-11-09 DIAGNOSIS — E038 Other specified hypothyroidism: Secondary | ICD-10-CM

## 2015-11-09 MED ORDER — ATORVASTATIN CALCIUM 80 MG PO TABS: 80.0000 mg | ORAL_TABLET | Freq: Every day | ORAL | Status: DC

## 2015-11-09 NOTE — Patient Instructions (Signed)
Medication Instructions:   PLEASE HOLD YOUR ATORVASTATIN FOR THE NEXT 3 WEEKS, WHILE BEING ON DIFLUCAN----THIS IS BECAUSE THESE 2 MEDICATION CAN POTENTIALLY CAUSE AN INTERACTION WHEN TAKEN TOGETHER  STOP TAKING FERROUS SULFATE NOW   Labwork:  TODAY---TSH, FREE T 3, FREE T 4   Follow-Up:  3 MONTHS WITH [name]     If you need a refill on your cardiac medications before your next appointment, please call your pharmacy.

## 2015-11-09 NOTE — Progress Notes (Signed)
Patient ID: [name], female   DOB: Apr 02, 1946, 70 y.o.   MRN: [id number] Patient ID: [name], female   DOB: 06/18/1946, 70 y.o.   MRN: [id number]      Cardiology Office Note   Date:  11/09/2015   ID:  [name], DOB 21-Feb-1946, MRN [id number]  PCP:  Rogelia Boga, MD  Cardiologist: Lars Masson, MD   CC:    History of Present Illness: [name] is a 70 y.o. female who presents for post hospitalization visit after NSTEMI--> 3 VD --> emergent CABG in July 2016. She is mourning as her husband just passed away. She denies chest pain, SOB, no muscle pain, na palpitations, syncope, no LE edema, orthopnea, PND.  She has been doing well, gained 15 lbs since her husband passed away, she denies chest pain, DOE, orthopnea, PND, LE edema. She has been started on fluconazole for inguinal yeast infection. No palpitations or syncope.  Past Medical History  Diagnosis Date  . Psoriatic arthritis (HCC)   . Rheumatic disease   . Hypertension   . Hypothyroidism   . Barrett esophagus   . COPD (chronic obstructive pulmonary disease) (HCC)   . Chronic respiratory failure (HCC)   . Pulmonary fibrosis (HCC)   . GERD (gastroesophageal reflux disease)   . SOB (shortness of breath) on exertion   . History of home oxygen therapy     AT NIGHT WHEN SLEEPING 2 L / MIN NASAL CANNULA  . History of ARDS 2006  . Pneumococcal pneumonia (HCC) 2006    HOSPITALIZED AND DEVELOPED ARDS  . Arthritis     OA RIGHT KNEE WITH PAIN  . NSTEMI (non-ST elevated myocardial infarction) (HCC) 05/31/2015    Past Surgical History  Procedure Laterality Date  . Carpal tunnel release    . Cosmetic breast surgery    . Abdominoplasty    . Cholecystectomy    . Knee arthroscopy Left   . Total knee arthroplasty Left   . Joint replacement    . Total knee arthroplasty Right 06/06/2013    Procedure: RIGHT TOTAL KNEE ARTHROPLASTY;  Surgeon: Loanne Drilling, MD;  Location: [facility];  Service: Orthopedics;   Laterality: Right;  . Cardiac catheterization N/A 06/01/2015    Procedure: Left Heart Cath and Coronary Angiography;  Surgeon: Runell Gess, MD;  Location: [facility] INVASIVE CV LAB;  Service: Cardiovascular;  Laterality: N/A;  . Coronary artery bypass graft N/A 06/04/2015    Procedure: CORONARY ARTERY BYPASS GRAFT times three            with left internal mammary artery and right leg saphenous vein;  Surgeon: Alleen Borne, MD;  Location: [facility] OR;  Service: Open Heart Surgery;  Laterality: N/A;  . Tee without cardioversion  06/04/2015    Procedure: TRANSESOPHAGEAL ECHOCARDIOGRAM (TEE);  Surgeon: Alleen Borne, MD;  Location: [facility] OR;  Service: Open Heart Surgery;;   Current Outpatient Prescriptions  Medication Sig Dispense Refill  . acetaminophen (TYLENOL ARTHRITIS PAIN) 650 MG CR tablet Take 1,300 mg by mouth every 8 (eight) hours as needed for pain. Per bottle directions as needed    . aspirin EC 325 MG EC tablet Take 1 tablet (325 mg total) by mouth daily. 30 tablet 0  . atorvastatin (LIPITOR) 80 MG tablet Take 1 tablet (80 mg total) by mouth daily at 6 PM. 90 tablet 1  . budesonide-formoterol (SYMBICORT) 160-4.5 MCG/ACT inhaler Inhale 2 puffs into the lungs 2 (two) times daily. 3 Inhaler  3  . Certolizumab Pegol (CIMZIA [place]) Inject into the skin as directed. Cimzia, Injected by provider as directed    . Cholecalciferol (VITAMIN D3) 2000 UNITS TABS Take 2,000 Int'l Units by mouth daily.    . citalopram (CELEXA) 20 MG tablet Take 1 tablet by mouth  every morning    . Ferrous Sulfate (IRON SUPPLEMENT PO) Take by mouth.    . fluconazole (DIFLUCAN) 150 MG tablet 1 tablet weekly for 4 consecutive weeks 4 tablet 0  . levothyroxine (SYNTHROID, LEVOTHROID) 137 MCG tablet Take 1 tablet (137 mcg total) by mouth daily before breakfast. 90 tablet 3  . losartan (COZAAR) 50 MG tablet Take 1 tablet (50 mg total) by mouth daily. 90 tablet 3  . omeprazole (PRILOSEC) 20 MG capsule Take 1 capsule (20 mg total) by mouth  every morning. 90 capsule 1  . traMADol (ULTRAM) 50 MG tablet Take 1 tablet (50 mg total) by mouth every 4 (four) hours as needed. Take one or two every 6 hours PRN pain 30 tablet 0  . triamcinolone cream (KENALOG) 0.1 % Apply 1 application topically 2 (two) times daily as needed (for psorisis).     No current facility-administered medications for this visit.   Allergies:   Review of patient's allergies indicates no known allergies.   Social History:  The patient  reports that she quit smoking about 10 years ago. Her smoking use included Cigarettes. She has a 87.75 pack-year smoking history. She has never used smokeless tobacco. She reports that she does not drink alcohol or use illicit drugs.   Family History:  The patient's family history includes Breast cancer in her mother; Coronary artery disease in her father; Rheum arthritis in her father.   ROS:  Please see the history of present illness.  All other systems are reviewed and negative.   PHYSICAL EXAM: VS:  BP 130/70 mmHg  Pulse 84  Ht 4' 11.5" (1.511 m)  Wt 183 lb (83.008 kg)  BMI 36.36 kg/m2 , BMI Body mass index is 36.36 kg/(m^2). GEN: Well nourished, well developed, in no acute distress HEENT: normal Neck: no JVD, carotid bruits, or masses Cardiac: RRR; no 3/6 systolic murmur, also rub, no gallops , mild LE edema R>L  Respiratory:  Wheezing B/L, normal work of breathing GI: soft, nontender, nondistended, + BS MS: no deformity or atrophy Skin: warm and dry, no rash Neuro:  Strength and sensation are intact Psych: euthymic mood, full affect  EKG:  SRLAE, iRBBB, anterior MI age undetermined  Recent Labs: 06/05/2015: Magnesium 2.0 07/12/2015: ALT 15 09/25/2015: BUN 21; Creat 0.70; Hemoglobin 12.3; Platelets 215; Potassium 4.1; Sodium 137; TSH 12.493*   Lipid Panel    Component Value Date/Time   CHOL 117 07/12/2015 1508   CHOL 149 06/01/2015 0400   TRIG 75 07/12/2015 1508   TRIG 126 06/01/2015 0400   HDL 51 07/12/2015  1508   HDL 34* 06/01/2015 0400   CHOLHDL 4.4 06/01/2015 0400   VLDL 25 06/01/2015 0400   LDLCALC 51 07/12/2015 1508   LDLCALC 90 06/01/2015 0400   LDLDIRECT 131.1 08/10/2012 0829   Wt Readings from Last 3 Encounters:  11/09/15 183 lb (83.008 kg)  09/25/15 170 lb (77.111 kg)  07/27/15 167 lb (75.751 kg)    ECHO: 06/01/2015 - Left ventricle: The cavity size was normal. Wall thickness was increased in a pattern of mild LVH. Systolic function was normal. The estimated ejection fraction was in the range of 55% to 60%. There is hypokinesis of  the basalinferolateral myocardium. Doppler parameters are consistent with abnormal left ventricular relaxation (grade 1 diastolic dysfunction). - Mitral valve: Calcified annulus. Mildly thickened leaflets .  Impressions: - Hypokinesis of the basal/mild inferior lateral wall with overall preserved LV function; grade 1 diastolic dysfunction; mild LVH; trace MR.  TELEMETRY:  SR  TTE: 06/01/15 Study Conclusions  - Left ventricle: The cavity size was normal. Wall thickness was increased in a pattern of mild LVH. Systolic function was normal. The estimated ejection fraction was in the range of 55% to 60%. There is hypokinesis of the basalinferolateral myocardium. Doppler parameters are consistent with abnormal left ventricular relaxation (grade 1 diastolic dysfunction). - Mitral valve: Calcified annulus. Mildly thickened leaflets .  Impressions:  - Hypokinesis of the basal/mild inferior lateral wall with overall preserved LV function; grade 1 diastolic dysfunction; mild LVH; trace MR.  TTE: 09/07/2015 - Left ventricle: The cavity size was normal. There was moderate concentric hypertrophy. Systolic function was normal. The estimated ejection fraction was in the range of 60% to 65%. Wall motion was normal; there were no regional wall motion abnormalities. Doppler parameters are consistent with  abnormal left ventricular relaxation (grade 1 diastolic dysfunction). There was no evidence of elevated ventricular filling pressure by Doppler parameters. - Aortic valve: There was no regurgitation. - Aortic root: The aortic root was normal in size. - Mitral valve: Calcified annulus. Mildly thickened leaflets . - Left atrium: The atrium was mildly dilated. - Right ventricle: The cavity size was normal. Wall thickness was normal. Systolic function was normal. - Right atrium: The atrium was normal in size. - Tricuspid valve: There was trivial regurgitation. - Pulmonic valve: There was no regurgitation. - Pulmonary arteries: Systolic pressure was within the normal range. - Inferior vena cava: The vessel was normal in size. - Pericardium, extracardiac: There was no pericardial effusion.  Impressions:  - There is mild basal, mid inferior wall hypokinesis, overall LVEF normal.   ASSESSMENT AND PLAN:  1. NSTEMI (non-ST elevated myocardial infarction), 3-VD with preserved LV function. Repeat echo showed LVEF 60-65%. On aspirin and atorvastatin, losartan, metoprolol discontinued as she was bradycardic.   2. HTN - controlled now .   3. Hyperlipidemia - on atorvastatin, hold while using fluconazole, restart afterwards.  4. Hypothyroidism - TSH 12 in 11/16, synthroid was increased, we will recheck today.  5. Anemia - post CABG, Hb 9--> 12, discontinue FeS.   6. Acute bronchitis - started Z-pack, resolved  Follow up in 3 months, TSH, fT3, fT4 today.Rico Sheehan, MD  11/09/2015 2:03 PM    [facility] [address] [place], [place], [place]  [address] Phone: [phone]; Fax: [phone]

## 2015-11-10 LAB — TSH: TSH: 8.017 u[IU]/mL — ABNORMAL HIGH (ref 0.350–4.500)

## 2015-11-12 ENCOUNTER — Telehealth: Payer: Self-pay | Admitting: *Deleted

## 2015-11-12 MED ORDER — LEVOTHYROXINE SODIUM 150 MCG PO TABS: 150.0000 ug | ORAL_TABLET | Freq: Every day | ORAL | Status: DC

## 2015-11-12 NOTE — Telephone Encounter (Signed)
-----   Message from Lars Masson, MD sent at 11/10/2015  7:25 PM EST ----- Her TSH has improved, but is still elevated, I would further increase levothyroxine to 150 mcg PO daily.

## 2015-11-12 NOTE — Telephone Encounter (Signed)
Notified the pt that per [name], her labs show that her TSH has improved, but still slightly elevated.  Informed the pt that [name] recommends that we increase her levothyroxine to 150 mcg po daily.  Confirmed the pharmacy of choice with the pt.  Pt just requesting a month supply to be sent to her pharmacy, to make sure dosage doesn't get changed again.  Sent a months supply to the pts pharmacy of choice.  Pt verbalized understanding and agrees with this plan.

## 2015-11-22 DIAGNOSIS — L570 Actinic keratosis: Secondary | ICD-10-CM | POA: Diagnosis not present

## 2015-11-22 DIAGNOSIS — L814 Other melanin hyperpigmentation: Secondary | ICD-10-CM | POA: Diagnosis not present

## 2015-11-29 ENCOUNTER — Other Ambulatory Visit: Payer: Self-pay | Admitting: Internal Medicine

## 2015-11-29 ENCOUNTER — Encounter: Payer: Self-pay | Admitting: Internal Medicine

## 2015-11-29 MED ORDER — TRAMADOL HCL 50 MG PO TABS: 50.0000 mg | ORAL_TABLET | ORAL | Status: DC | PRN

## 2015-11-29 NOTE — Telephone Encounter (Signed)
Rx for Tramadol faxed to [facility] and other Rx sent electronically.

## 2015-11-29 NOTE — Telephone Encounter (Signed)
[facility] faxed a refill request for Tramadol.

## 2015-11-30 ENCOUNTER — Other Ambulatory Visit: Payer: Self-pay | Admitting: *Deleted

## 2015-11-30 MED ORDER — TRAMADOL HCL 50 MG PO TABS: ORAL_TABLET | ORAL | Status: DC

## 2015-12-04 DIAGNOSIS — M0589 Other rheumatoid arthritis with rheumatoid factor of multiple sites: Secondary | ICD-10-CM | POA: Diagnosis not present

## 2016-01-01 DIAGNOSIS — M0589 Other rheumatoid arthritis with rheumatoid factor of multiple sites: Secondary | ICD-10-CM | POA: Diagnosis not present

## 2016-01-07 ENCOUNTER — Encounter: Payer: Self-pay | Admitting: Cardiology

## 2016-01-07 DIAGNOSIS — Z951 Presence of aortocoronary bypass graft: Secondary | ICD-10-CM

## 2016-01-07 DIAGNOSIS — I10 Essential (primary) hypertension: Secondary | ICD-10-CM

## 2016-01-07 DIAGNOSIS — J209 Acute bronchitis, unspecified: Secondary | ICD-10-CM

## 2016-01-07 DIAGNOSIS — D62 Acute posthemorrhagic anemia: Secondary | ICD-10-CM

## 2016-01-07 DIAGNOSIS — I5033 Acute on chronic diastolic (congestive) heart failure: Secondary | ICD-10-CM

## 2016-01-07 MED ORDER — LEVOTHYROXINE SODIUM 150 MCG PO TABS: 150.0000 ug | ORAL_TABLET | Freq: Every day | ORAL | Status: DC

## 2016-01-07 MED ORDER — LOSARTAN POTASSIUM 50 MG PO TABS: 50.0000 mg | ORAL_TABLET | Freq: Every day | ORAL | Status: DC

## 2016-01-07 NOTE — Telephone Encounter (Signed)
Will route this message to [name] to confirm current doses of meds and refill accordingly.  Will follow-up with the pt thereafter.

## 2016-01-28 DIAGNOSIS — L409 Psoriasis, unspecified: Secondary | ICD-10-CM | POA: Diagnosis not present

## 2016-01-28 DIAGNOSIS — M150 Primary generalized (osteo)arthritis: Secondary | ICD-10-CM | POA: Diagnosis not present

## 2016-01-28 DIAGNOSIS — I252 Old myocardial infarction: Secondary | ICD-10-CM | POA: Diagnosis not present

## 2016-01-28 DIAGNOSIS — M0589 Other rheumatoid arthritis with rheumatoid factor of multiple sites: Secondary | ICD-10-CM | POA: Diagnosis not present

## 2016-01-28 DIAGNOSIS — Z79899 Other long term (current) drug therapy: Secondary | ICD-10-CM | POA: Diagnosis not present

## 2016-01-29 DIAGNOSIS — M0589 Other rheumatoid arthritis with rheumatoid factor of multiple sites: Secondary | ICD-10-CM | POA: Diagnosis not present

## 2016-02-19 ENCOUNTER — Ambulatory Visit (INDEPENDENT_AMBULATORY_CARE_PROVIDER_SITE_OTHER): Payer: Medicare Other | Admitting: Cardiology

## 2016-02-19 ENCOUNTER — Encounter: Payer: Self-pay | Admitting: Cardiology

## 2016-02-19 VITALS — BP 138/68 | HR 84 | Ht 59.5 in | Wt 190.0 lb

## 2016-02-19 DIAGNOSIS — I10 Essential (primary) hypertension: Secondary | ICD-10-CM

## 2016-02-19 DIAGNOSIS — I2510 Atherosclerotic heart disease of native coronary artery without angina pectoris: Secondary | ICD-10-CM

## 2016-02-19 DIAGNOSIS — R0609 Other forms of dyspnea: Secondary | ICD-10-CM | POA: Diagnosis not present

## 2016-02-19 DIAGNOSIS — E785 Hyperlipidemia, unspecified: Secondary | ICD-10-CM

## 2016-02-19 DIAGNOSIS — I5033 Acute on chronic diastolic (congestive) heart failure: Secondary | ICD-10-CM

## 2016-02-19 DIAGNOSIS — R0600 Dyspnea, unspecified: Secondary | ICD-10-CM

## 2016-02-19 DIAGNOSIS — J410 Simple chronic bronchitis: Secondary | ICD-10-CM

## 2016-02-19 DIAGNOSIS — I2583 Coronary atherosclerosis due to lipid rich plaque: Secondary | ICD-10-CM

## 2016-02-19 MED ORDER — FUROSEMIDE 20 MG PO TABS: ORAL_TABLET | ORAL | Status: DC

## 2016-02-19 NOTE — Progress Notes (Signed)
Patient ID: [name], female   DOB: Mar 13, 1946, 70 y.o.   MRN: [id number]      Cardiology Office Note   Date:  02/19/2016   ID:  [name], DOB July 09, 1946, MRN [id number]  PCP:  Rogelia Boga, MD  Cardiologist: Lars Masson, MD   CC:    History of Present Illness: [name] is a 70 y.o. female who presents for post hospitalization visit after NSTEMI--> 3 VD --> emergent CABG in July 2016. She is mourning as her husband just passed away. She denies chest pain, SOB, no muscle pain, na palpitations, syncope, no LE edema, orthopnea, PND.  Patient comes after 2 months, at the last visit she complained that she has gained 15 pounds, today she again gained another 8 pounds. She has noticed to get short of breath with any activity but she attributes this to weight gain. She wakes up at night feeling short of breath. She has been having chronic cough for months on with white yellowish sputum. No fevers or chills. No lower extremity edema. No chest pain palpitations or syncope.    Past Medical History  Diagnosis Date  . Psoriatic arthritis (HCC)   . Rheumatic disease   . Hypertension   . Hypothyroidism   . Barrett esophagus   . COPD (chronic obstructive pulmonary disease) (HCC)   . Chronic respiratory failure (HCC)   . Pulmonary fibrosis (HCC)   . GERD (gastroesophageal reflux disease)   . SOB (shortness of breath) on exertion   . History of home oxygen therapy     AT NIGHT WHEN SLEEPING 2 L / MIN NASAL CANNULA  . History of ARDS 2006  . Pneumococcal pneumonia (HCC) 2006    HOSPITALIZED AND DEVELOPED ARDS  . Arthritis     OA RIGHT KNEE WITH PAIN  . NSTEMI (non-ST elevated myocardial infarction) (HCC) 05/31/2015    Past Surgical History  Procedure Laterality Date  . Carpal tunnel release    . Cosmetic breast surgery    . Abdominoplasty    . Cholecystectomy    . Knee arthroscopy Left   . Total knee arthroplasty Left   . Joint replacement    . Total  knee arthroplasty Right 06/06/2013    Procedure: RIGHT TOTAL KNEE ARTHROPLASTY;  Surgeon: Loanne Drilling, MD;  Location: [facility] ORS;  Service: Orthopedics;  Laterality: Right;  . Cardiac catheterization N/A 06/01/2015    Procedure: Left Heart Cath and Coronary Angiography;  Surgeon: Runell Gess, MD;  Location: [facility] INVASIVE CV LAB;  Service: Cardiovascular;  Laterality: N/A;  . Coronary artery bypass graft N/A 06/04/2015    Procedure: CORONARY ARTERY BYPASS GRAFT times three            with left internal mammary artery and right leg saphenous vein;  Surgeon: Alleen Borne, MD;  Location: [facility] OR;  Service: Open Heart Surgery;  Laterality: N/A;  . Tee without cardioversion  06/04/2015    Procedure: TRANSESOPHAGEAL ECHOCARDIOGRAM (TEE);  Surgeon: Alleen Borne, MD;  [facility];  Service: Open Heart Surgery;;   Current Outpatient Prescriptions  Medication Sig Dispense Refill  . acetaminophen (TYLENOL ARTHRITIS PAIN) 650 MG CR tablet Take 1,300 mg by mouth every 8 (eight) hours as needed for pain. Per bottle directions as needed    . aspirin EC 325 MG EC tablet Take 1 tablet (325 mg total) by mouth daily. 30 tablet 0  . atorvastatin (LIPITOR) 80 MG tablet Take 1 tablet (80 mg total)  by mouth daily at 6 PM. 90 tablet 3  . budesonide-formoterol (SYMBICORT) 160-4.5 MCG/ACT inhaler Inhale 2 puffs into the lungs 2 (two) times daily. 3 Inhaler 3  . Certolizumab Pegol (CIMZIA [place]) Inject into the skin as directed. Cimzia, Injected by provider as directed    . Cholecalciferol (VITAMIN D3) 2000 UNITS TABS Take 2,000 Int'l Units by mouth daily.    . citalopram (CELEXA) 20 MG tablet Take 1 tablet by mouth  every morning    . citalopram (CELEXA) 40 MG tablet Take 1 tablet by mouth  every morning 90 tablet 3  . fluconazole (DIFLUCAN) 150 MG tablet 1 tablet weekly for 4 consecutive weeks 4 tablet 0  . levothyroxine (SYNTHROID) 150 MCG tablet Take 1 tablet (150 mcg total) by mouth daily before breakfast. 30 tablet 2    . losartan (COZAAR) 50 MG tablet Take 1 tablet (50 mg total) by mouth daily. 90 tablet 3  . omeprazole (PRILOSEC) 20 MG capsule Take 1 capsule (20 mg total) by mouth every morning. 90 capsule 1  . traMADol (ULTRAM) 50 MG tablet TAKE 1-2 TABLETS EVERY 6 HOURS AS NEEDED FOR PAIN 360 tablet 3  . triamcinolone cream (KENALOG) 0.1 % Apply 1 application topically 2 (two) times daily as needed (for psorisis).     No current facility-administered medications for this visit.   Allergies:   Review of patient's allergies indicates no known allergies.   Social History:  The patient  reports that she quit smoking about 10 years ago. Her smoking use included Cigarettes. She has a 87.75 pack-year smoking history. She has never used smokeless tobacco. She reports that she does not drink alcohol or use illicit drugs.   Family History:  The patient's family history includes Breast cancer in her mother; Coronary artery disease in her father; Rheum arthritis in her father.   ROS:  Please see the history of present illness.  All other systems are reviewed and negative.   PHYSICAL EXAM: VS:  Ht 4' 11.5" (1.511 m) , BMI There is no weight on file to calculate BMI. GEN: Well nourished, well developed, in no acute distress HEENT: normal Neck: no JVD, carotid bruits, or masses Cardiac: RRR; no 3/6 systolic murmur, also rub, no gallops , no LE edema  Respiratory:  Crackles at both bases.  GI: soft, nontender, nondistended, + BS MS: no deformity or atrophy Skin: warm and dry, no rash Neuro:  Strength and sensation are intact Psych: euthymic mood, full affect  EKG:  SRLAE, iRBBB, anterior MI age undetermined  Recent Labs: 06/05/2015: Magnesium 2.0 07/12/2015: ALT 15 09/25/2015: BUN 21; Creat 0.70; Hemoglobin 12.3; Platelets 215; Potassium 4.1; Sodium 137 11/09/2015: TSH 8.017*   Lipid Panel    Component Value Date/Time   CHOL 117 07/12/2015 1508   CHOL 149 06/01/2015 0400   TRIG 75 07/12/2015 1508   TRIG  126 06/01/2015 0400   HDL 51 07/12/2015 1508   HDL 34* 06/01/2015 0400   CHOLHDL 4.4 06/01/2015 0400   VLDL 25 06/01/2015 0400   LDLCALC 51 07/12/2015 1508   LDLCALC 90 06/01/2015 0400   LDLDIRECT 131.1 08/10/2012 0829   Wt Readings from Last 3 Encounters:  11/09/15 183 lb (83.008 kg)  09/25/15 170 lb (77.111 kg)  07/27/15 167 lb (75.751 kg)    ECHO: 06/01/2015 - Left ventricle: The cavity size was normal. Wall thickness was increased in a pattern of mild LVH. Systolic function was normal. The estimated ejection fraction was in the range of 55%  to 60%. There is hypokinesis of the basalinferolateral myocardium. Doppler parameters are consistent with abnormal left ventricular relaxation (grade 1 diastolic dysfunction). - Mitral valve: Calcified annulus. Mildly thickened leaflets .  Impressions: - Hypokinesis of the basal/mild inferior lateral wall with overall preserved LV function; grade 1 diastolic dysfunction; mild LVH; trace MR.  TELEMETRY:  SR  TTE: 06/01/15 Study Conclusions  - Left ventricle: The cavity size was normal. Wall thickness was increased in a pattern of mild LVH. Systolic function was normal. The estimated ejection fraction was in the range of 55% to 60%. There is hypokinesis of the basalinferolateral myocardium. Doppler parameters are consistent with abnormal left ventricular relaxation (grade 1 diastolic dysfunction). - Mitral valve: Calcified annulus. Mildly thickened leaflets .  Impressions:  - Hypokinesis of the basal/mild inferior lateral wall with overall preserved LV function; grade 1 diastolic dysfunction; mild LVH; trace MR.  TTE: 09/07/2015 - Left ventricle: The cavity size was normal. There was moderate concentric hypertrophy. Systolic function was normal. The estimated ejection fraction was in the range of 60% to 65%. Wall motion was normal; there were no regional wall motion abnormalities. Doppler  parameters are consistent with abnormal left ventricular relaxation (grade 1 diastolic dysfunction). There was no evidence of elevated ventricular filling pressure by Doppler parameters. - Aortic valve: There was no regurgitation. - Aortic root: The aortic root was normal in size. - Mitral valve: Calcified annulus. Mildly thickened leaflets . - Left atrium: The atrium was mildly dilated. - Right ventricle: The cavity size was normal. Wall thickness was normal. Systolic function was normal. - Right atrium: The atrium was normal in size. - Tricuspid valve: There was trivial regurgitation. - Pulmonic valve: There was no regurgitation. - Pulmonary arteries: Systolic pressure was within the normal range. - Inferior vena cava: The vessel was normal in size. - Pericardium, extracardiac: There was no pericardial effusion.  Impressions:  - There is mild basal, mid inferior wall hypokinesis, overall LVEF normal.  EKG performed today 02/19/2016 showed normal sinus rhythm rightward axis incomplete right bundle branch block and prior anterior MI.   ASSESSMENT AND PLAN:  1. CAD, s/p NSTEMI (non-ST elevated myocardial infarction), 3-VD with preserved LV function. Repeat echo showed LVEF 60-65%. On aspirin and atorvastatin, losartan, metoprolol discontinued as she was bradycardic.  She is currently experiencing dyspnea on exertion however I believe this is secondary to CHF. Her EKG today is unchanged from prior in September 2016.  2. Dyspnea on exertion - patient has evidence of fluid overload. I will start her on Lasix 40 mg daily 3 days followed by Lasix 20 mg daily, we will follow her in 4 weeks. If at that times she is euvolemic and continues to have dyspnea on exertion we'll consider scheduling a stress test.  3. HTN - controlled, continue same regimen..   4. Hyperlipidemia - on atorvastatin, LFTs normal.  5. Hypothyroidism - TSH 12 in 11/16, 8 in 1/17, increased Synthroid to  150 mcg daily. We will recheck.  6. Chronic bronchitis, uses Symbicort infrequently advised to use it twice a day, she uses oxygen at night.  Follow up in 4 weeks, BMP and TSH prior to the appointment.  Signed, Lars Masson, MD  02/19/2016 1:45 PM    [facility] [address] [place], [place], [place]  [address] Phone: [phone]; Fax: [phone]

## 2016-02-19 NOTE — Patient Instructions (Signed)
Medication Instructions:   START TAKING LASIX 40 MG BY MOUTH DAILY  X 3 DAYS, THEN TAKE LASIX 20 MG BY MOUTH DAILY THEREAFTER   Labwork:  PRIOR TO YOUR 4 WEEK FOLLOW-UP WITH AN EXTENDER IN OUR OFFICE--WE WILL CHECK--BMET, CBC W DIFF, AND A TSH    Follow-Up:  4 WEEKS WITH AN EXTENDER IN OUR OFFICE---PLEASE HAVE YOUR LABS DONE PRIOR TO THIS APPOINTMENT      If you need a refill on your cardiac medications before your next appointment, please call your pharmacy.

## 2016-02-20 DIAGNOSIS — Z1231 Encounter for screening mammogram for malignant neoplasm of breast: Secondary | ICD-10-CM | POA: Diagnosis not present

## 2016-02-20 DIAGNOSIS — Z803 Family history of malignant neoplasm of breast: Secondary | ICD-10-CM | POA: Diagnosis not present

## 2016-02-20 LAB — HM MAMMOGRAPHY

## 2016-02-21 ENCOUNTER — Encounter: Payer: Self-pay | Admitting: Cardiology

## 2016-02-22 ENCOUNTER — Encounter: Payer: Self-pay | Admitting: Internal Medicine

## 2016-03-05 DIAGNOSIS — M0589 Other rheumatoid arthritis with rheumatoid factor of multiple sites: Secondary | ICD-10-CM | POA: Diagnosis not present

## 2016-03-12 ENCOUNTER — Encounter: Payer: Self-pay | Admitting: Cardiology

## 2016-03-12 DIAGNOSIS — R0609 Other forms of dyspnea: Secondary | ICD-10-CM

## 2016-03-12 DIAGNOSIS — E038 Other specified hypothyroidism: Secondary | ICD-10-CM

## 2016-03-12 DIAGNOSIS — R0600 Dyspnea, unspecified: Secondary | ICD-10-CM

## 2016-03-12 DIAGNOSIS — I5033 Acute on chronic diastolic (congestive) heart failure: Secondary | ICD-10-CM

## 2016-03-12 DIAGNOSIS — R5383 Other fatigue: Secondary | ICD-10-CM

## 2016-03-18 ENCOUNTER — Other Ambulatory Visit (INDEPENDENT_AMBULATORY_CARE_PROVIDER_SITE_OTHER): Payer: Medicare Other | Admitting: *Deleted

## 2016-03-18 DIAGNOSIS — E038 Other specified hypothyroidism: Secondary | ICD-10-CM

## 2016-03-18 DIAGNOSIS — I10 Essential (primary) hypertension: Secondary | ICD-10-CM

## 2016-03-18 DIAGNOSIS — R0609 Other forms of dyspnea: Secondary | ICD-10-CM

## 2016-03-18 DIAGNOSIS — I5033 Acute on chronic diastolic (congestive) heart failure: Secondary | ICD-10-CM | POA: Diagnosis not present

## 2016-03-18 DIAGNOSIS — R5383 Other fatigue: Secondary | ICD-10-CM

## 2016-03-18 DIAGNOSIS — R0600 Dyspnea, unspecified: Secondary | ICD-10-CM

## 2016-03-18 LAB — BASIC METABOLIC PANEL
BUN: 21 mg/dL (ref 7–25)
CO2: 37 mmol/L — ABNORMAL HIGH (ref 20–31)
Calcium: 9.8 mg/dL (ref 8.6–10.4)
Chloride: 95 mmol/L — ABNORMAL LOW (ref 98–110)
Creat: 0.74 mg/dL (ref 0.50–0.99)
Glucose, Bld: 101 mg/dL — ABNORMAL HIGH (ref 65–99)
Potassium: 4.6 mmol/L (ref 3.5–5.3)
Sodium: 140 mmol/L (ref 135–146)

## 2016-03-18 LAB — CBC WITH DIFFERENTIAL/PLATELET
Basophils Absolute: 74 cells/uL (ref 0–200)
Basophils Relative: 1 %
Eosinophils Absolute: 518 cells/uL — ABNORMAL HIGH (ref 15–500)
Eosinophils Relative: 7 %
HCT: 38.2 % (ref 35.0–45.0)
Hemoglobin: 12.2 g/dL (ref 11.7–15.5)
Lymphocytes Relative: 26 %
Lymphs Abs: 1924 cells/uL (ref 850–3900)
MCH: 29.1 pg (ref 27.0–33.0)
MCHC: 31.9 g/dL — ABNORMAL LOW (ref 32.0–36.0)
MCV: 91.2 fL (ref 80.0–100.0)
MPV: 9.7 fL (ref 7.5–12.5)
Monocytes Absolute: 740 cells/uL (ref 200–950)
Monocytes Relative: 10 %
Neutro Abs: 4144 cells/uL (ref 1500–7800)
Neutrophils Relative %: 56 %
Platelets: 212 10*3/uL (ref 140–400)
RBC: 4.19 MIL/uL (ref 3.80–5.10)
RDW: 14.1 % (ref 11.0–15.0)
WBC: 7.4 10*3/uL (ref 3.8–10.8)

## 2016-03-18 LAB — VITAMIN B12: Vitamin B-12: 512 pg/mL (ref 200–1100)

## 2016-03-18 LAB — TSH: TSH: 2.06 mIU/L

## 2016-03-18 NOTE — Addendum Note (Signed)
Addended by: Tonita Phoenix on: 03/18/2016 11:34 AM   Modules accepted: Orders

## 2016-03-20 DIAGNOSIS — L814 Other melanin hyperpigmentation: Secondary | ICD-10-CM | POA: Diagnosis not present

## 2016-03-20 DIAGNOSIS — L400 Psoriasis vulgaris: Secondary | ICD-10-CM | POA: Diagnosis not present

## 2016-03-26 ENCOUNTER — Encounter: Payer: Self-pay | Admitting: Nurse Practitioner

## 2016-03-26 ENCOUNTER — Ambulatory Visit (INDEPENDENT_AMBULATORY_CARE_PROVIDER_SITE_OTHER): Payer: Medicare Other | Admitting: Nurse Practitioner

## 2016-03-26 VITALS — BP 120/60 | HR 80 | Ht 59.0 in | Wt 193.8 lb

## 2016-03-26 DIAGNOSIS — I5032 Chronic diastolic (congestive) heart failure: Secondary | ICD-10-CM | POA: Diagnosis not present

## 2016-03-26 DIAGNOSIS — I2510 Atherosclerotic heart disease of native coronary artery without angina pectoris: Secondary | ICD-10-CM

## 2016-03-26 LAB — BASIC METABOLIC PANEL
BUN: 27 mg/dL — ABNORMAL HIGH (ref 7–25)
CO2: 32 mmol/L — ABNORMAL HIGH (ref 20–31)
Calcium: 9.7 mg/dL (ref 8.6–10.4)
Chloride: 96 mmol/L — ABNORMAL LOW (ref 98–110)
Creat: 0.88 mg/dL (ref 0.50–0.99)
Glucose, Bld: 92 mg/dL (ref 65–99)
Potassium: 4.9 mmol/L (ref 3.5–5.3)
Sodium: 137 mmol/L (ref 135–146)

## 2016-03-26 LAB — BRAIN NATRIURETIC PEPTIDE: Brain Natriuretic Peptide: 44.3 pg/mL (ref <100)

## 2016-03-26 NOTE — Patient Instructions (Signed)
We will be checking the following labs today - BMET & BNP  Medication Instructions:    Continue with your current medicines.     Testing/Procedures To Be Arranged:  N/A  Follow-Up:   See Dr. Delton See in 3 to 4 months.    Other Special Instructions:   N/A    If you need a refill on your cardiac medications before your next appointment, please call your pharmacy.   Call the [facility] office at [phone] if you have any questions, problems or concerns.

## 2016-03-26 NOTE — Progress Notes (Signed)
CARDIOLOGY OFFICE NOTE  Date:  03/26/2016    [name] Date of Birth: 19-Apr-1946 Medical Record #[id number]  PCP:  Amber Boga, MD  Cardiologist:  [name]    Chief Complaint  Patient presents with  . Shortness of Breath    Follow up visit - seen for Dr. Delton Huff    History of Present Illness: [name] is a 70 y.o. female who presents today for a follow up visit. Seen for Dr. Delton Huff.   She has a history of obesity, HTN, RA, hypothyroidism, COPD, fibrosis, GERD, prior NSTEMI with emergent CABG back in July of 2016 per Dr. Laneta Huff and [facility].   Seen back a month ago - endorsing more dyspnea and weight gain. Given Lasix.   Comes back today. Here alone. Weight continues to go up. She admits she eats too much but really does not eat out. She is cooking at home. Tries to not use much salt. Does not weigh daily. She uses her oxygen at night and with exertion. She is able to do yard work. No chest pain. No swelling. She feels like she is stable on her current plan. No real change with being on Lasix.   Past Medical History  Diagnosis Date  . Psoriatic arthritis (HCC)   . Rheumatic disease   . Hypertension   . Hypothyroidism   . Barrett esophagus   . COPD (chronic obstructive pulmonary disease) (HCC)   . Chronic respiratory failure (HCC)   . Pulmonary fibrosis (HCC)   . GERD (gastroesophageal reflux disease)   . SOB (shortness of breath) on exertion   . History of home oxygen therapy     AT NIGHT WHEN SLEEPING 2 L / MIN NASAL CANNULA  . History of ARDS 2006  . Pneumococcal pneumonia (HCC) 2006    HOSPITALIZED AND DEVELOPED ARDS  . Arthritis     OA RIGHT KNEE WITH PAIN  . NSTEMI (non-ST elevated myocardial infarction) (HCC) 05/31/2015    Past Surgical History  Procedure Laterality Date  . Carpal tunnel release    . Cosmetic breast surgery    . Abdominoplasty    . Cholecystectomy    . Knee arthroscopy Left   . Total knee arthroplasty Left   . Joint  replacement    . Total knee arthroplasty Right 06/06/2013    Procedure: RIGHT TOTAL KNEE ARTHROPLASTY;  Surgeon: Loanne Drilling, MD;  Location: [facility] ORS;  Service: Orthopedics;  Laterality: Right;  . Cardiac catheterization N/A 06/01/2015    Procedure: Left Heart Cath and Coronary Angiography;  Surgeon: Runell Gess, MD;  Location: [facility] INVASIVE CV LAB;  Service: Cardiovascular;  Laterality: N/A;  . Coronary artery bypass graft N/A 06/04/2015    Procedure: CORONARY ARTERY BYPASS GRAFT times three            with left internal mammary artery and right leg saphenous vein;  Surgeon: Alleen Borne, MD;  Location: [facility] OR;  Service: Open Heart Surgery;  Laterality: N/A;  . Tee without cardioversion  06/04/2015    Procedure: TRANSESOPHAGEAL ECHOCARDIOGRAM (TEE);  Surgeon: Alleen Borne, MD;  Location: [facility] OR;  Service: Open Heart Surgery;;     Medications: Current Outpatient Prescriptions  Medication Sig Dispense Refill  . acetaminophen (TYLENOL ARTHRITIS PAIN) 650 MG CR tablet Take 1,300 mg by mouth every 8 (eight) hours as needed for pain. Per bottle directions as needed    . aspirin EC 325 MG EC tablet Take 1 tablet (325 mg total)  by mouth daily. 30 tablet 0  . atorvastatin (LIPITOR) 80 MG tablet Take 1 tablet (80 mg total) by mouth daily at 6 PM. 90 tablet 3  . budesonide-formoterol (SYMBICORT) 160-4.5 MCG/ACT inhaler Inhale 2 puffs into the lungs 2 (two) times daily. 3 Inhaler 3  . Certolizumab Pegol (CIMZIA [place]) Inject into the skin as directed. Cimzia, Injected by provider as directed    . Cholecalciferol (VITAMIN D3) 2000 UNITS TABS Take 2,000 Int'l Units by mouth daily.    . citalopram (CELEXA) 40 MG tablet Take 1 tablet by mouth  every morning 90 tablet 3  . fluconazole (DIFLUCAN) 150 MG tablet 1 tablet weekly for 4 consecutive weeks 4 tablet 0  . furosemide (LASIX) 20 MG tablet Take 2 tablets (40 mg total) by mouth x 3 days, then take 1 tablet (20 mg total) by mouth daily thereafter. 96 tablet 3    . levothyroxine (SYNTHROID) 150 MCG tablet Take 1 tablet (150 mcg total) by mouth daily before breakfast. 30 tablet 2  . losartan (COZAAR) 50 MG tablet Take 1 tablet (50 mg total) by mouth daily. 90 tablet 3  . NON FORMULARY Place 2 L into the nose as needed (sob with exertion).    Marland Kitchen omeprazole (PRILOSEC) 20 MG capsule Take 1 capsule (20 mg total) by mouth every morning. 90 capsule 1  . traMADol (ULTRAM) 50 MG tablet TAKE 1-2 TABLETS EVERY 6 HOURS AS NEEDED FOR PAIN 360 tablet 3  . triamcinolone cream (KENALOG) 0.1 % Apply 1 application topically 2 (two) times daily as needed (for psorisis).     No current facility-administered medications for this visit.    Allergies: No Known Allergies  Social History: The patient  reports that she quit smoking about 11 years ago. Her smoking use included Cigarettes. She has a 87.75 pack-year smoking history. She has never used smokeless tobacco. She reports that she does not drink alcohol or use illicit drugs.   Family History: The patient's family history includes Breast cancer in her mother; Coronary artery disease in her father; Rheum arthritis in her father.   Review of Systems: Please Huff the history of present illness.   Otherwise, the review of systems is positive for none.   All other systems are reviewed and negative.   Physical Exam: VS:  BP 120/60 mmHg  Pulse 80  Ht 4\' 11"  (1.499 m)  Wt 193 lb 12.8 oz (87.907 kg)  BMI 39.12 kg/m2 .  BMI Body mass index is 39.12 kg/(m^2).  Wt Readings from Last 3 Encounters:  03/26/16 193 lb 12.8 oz (87.907 kg)  02/19/16 190 lb (86.183 kg)  11/09/15 183 lb (83.008 kg)    General: Pleasant. Obese female who is alert and in no acute distress.  HEENT: Normal. Neck: Supple, no JVD, carotid bruits, or masses noted.  Cardiac: Regular rate and rhythm. No murmurs, rubs, or gallops. No edema.  Respiratory:  Lungs with fine crackles in the bases with normal work of breathing.  GI: Soft and nontender.  Obese. MS: No deformity or atrophy. Gait and ROM intact. Skin: Warm and dry. Color is normal.  Neuro:  Strength and sensation are intact and no gross focal deficits noted.  Psych: Alert, appropriate and with normal affect.   LABORATORY DATA:  EKG:  EKG is not ordered today.  Lab Results  Component Value Date   WBC 7.4 03/18/2016   HGB 12.2 03/18/2016   HCT 38.2 03/18/2016   PLT 212 03/18/2016   GLUCOSE 101* 03/18/2016  CHOL 117 07/12/2015   TRIG 75 07/12/2015   HDL 51 07/12/2015   LDLDIRECT 131.1 08/10/2012   LDLCALC 51 07/12/2015   ALT 15 07/12/2015   AST 17 07/12/2015   NA 140 03/18/2016   K 4.6 03/18/2016   CL 95* 03/18/2016   CREATININE 0.74 03/18/2016   BUN 21 03/18/2016   CO2 37* 03/18/2016   TSH 2.06 03/18/2016   INR 1.30 06/04/2015   HGBA1C 5.8* 05/31/2015    BNP (last 3 results) No results for input(s): BNP in the last 8760 hours.  ProBNP (last 3 results) No results for input(s): PROBNP in the last 8760 hours.   Other Studies Reviewed Today:  ECHO: 06/01/2015 - Left ventricle: The cavity size was normal. Wall thickness was increased in a pattern of mild LVH. Systolic function was normal. The estimated ejection fraction was in the range of 55% to 60%. There is hypokinesis of the basalinferolateral myocardium. Doppler parameters are consistent with abnormal left ventricular relaxation (grade 1 diastolic dysfunction). - Mitral valve: Calcified annulus. Mildly thickened leaflets .  Impressions: - Hypokinesis of the basal/mild inferior lateral wall with overall preserved LV function; grade 1 diastolic dysfunction; mild LVH; trace MR.   TTE: 06/01/15 Study Conclusions  - Left ventricle: The cavity size was normal. Wall thickness was increased in a pattern of mild LVH. Systolic function was normal. The estimated ejection fraction was in the range of 55% to 60%. There is hypokinesis of the basalinferolateral myocardium. Doppler  parameters are consistent with abnormal left ventricular relaxation (grade 1 diastolic dysfunction). - Mitral valve: Calcified annulus. Mildly thickened leaflets .  Impressions:  - Hypokinesis of the basal/mild inferior lateral wall with overall preserved LV function; grade 1 diastolic dysfunction; mild LVH; trace MR.  TTE: 09/07/2015 - Left ventricle: The cavity size was normal. There was moderate concentric hypertrophy. Systolic function was normal. The estimated ejection fraction was in the range of 60% to 65%. Wall motion was normal; there were no regional wall motion abnormalities. Doppler parameters are consistent with abnormal left ventricular relaxation (grade 1 diastolic dysfunction). There was no evidence of elevated ventricular filling pressure by Doppler parameters. - Aortic valve: There was no regurgitation. - Aortic root: The aortic root was normal in size. - Mitral valve: Calcified annulus. Mildly thickened leaflets . - Left atrium: The atrium was mildly dilated. - Right ventricle: The cavity size was normal. Wall thickness was normal. Systolic function was normal. - Right atrium: The atrium was normal in size. - Tricuspid valve: There was trivial regurgitation. - Pulmonic valve: There was no regurgitation. - Pulmonary arteries: Systolic pressure was within the normal range. - Inferior vena cava: The vessel was normal in size. - Pericardium, extracardiac: There was no pericardial effusion.  Impressions:  - There is mild basal, mid inferior wall hypokinesis, overall LVEF normal.  EKG performed today 02/19/2016 showed normal sinus rhythm rightward axis incomplete right bundle branch block and prior anterior MI.   ASSESSMENT AND PLAN:  1. CAD, s/p NSTEMI (non-ST elevated myocardial infarction), 3-VD with preserved LV function. Repeat echo showed LVEF 60-65%. On aspirin and atorvastatin, losartan, metoprolol discontinued as she was  bradycardic.    2. Dyspnea on exertion/diastolic HF - no real change with the Lasix. Has fibrosis and on oxygen therapy - patient feels she is really at her baseline. Check BNP today. Continue with low dose Lasix. May need to change to prn if we Huff any kidney dysfunction.  3. HTN - controlled, continue same regimen.Marland Kitchen  4. Hyperlipidemia - on statin therapy  5. Hypothyroidism - on replacement therapy  6. COPD/fibrosis - with remote ARDs - on oxygen therapy.   Current medicines are reviewed with the patient today.  The patient does not have concerns regarding medicines other than what has been noted above.  The following changes have been made:  Huff above.  Labs/ tests ordered today include:    Orders Placed This Encounter  Procedures  . Basic metabolic panel  . Brain natriuretic peptide     Disposition:   FU with Dr. Delton Huff in 3 to 4 months.    Patient is agreeable to this plan and will call if any problems develop in the interim.   Signed: Rosalio Macadamia, RN, ANP-C 03/26/2016 12:11 PM  [facility] [address] [place], [place]  [address] Phone: [phone] Fax: [phone]

## 2016-03-28 ENCOUNTER — Other Ambulatory Visit: Payer: Self-pay | Admitting: *Deleted

## 2016-03-28 DIAGNOSIS — R0609 Other forms of dyspnea: Secondary | ICD-10-CM

## 2016-03-28 DIAGNOSIS — I5033 Acute on chronic diastolic (congestive) heart failure: Secondary | ICD-10-CM

## 2016-03-28 DIAGNOSIS — R0600 Dyspnea, unspecified: Secondary | ICD-10-CM

## 2016-03-28 DIAGNOSIS — I10 Essential (primary) hypertension: Secondary | ICD-10-CM

## 2016-03-28 MED ORDER — FUROSEMIDE 20 MG PO TABS: ORAL_TABLET | ORAL | Status: DC

## 2016-04-02 DIAGNOSIS — M0589 Other rheumatoid arthritis with rheumatoid factor of multiple sites: Secondary | ICD-10-CM | POA: Diagnosis not present

## 2016-04-07 ENCOUNTER — Telehealth: Payer: Self-pay | Admitting: Cardiology

## 2016-04-07 NOTE — Telephone Encounter (Signed)
Pharmacy requesting a refill on Levothyroxine 150 mg tablet. Please advise

## 2016-04-08 NOTE — Telephone Encounter (Signed)
Yes, please refill this medication accordingly, for [name] prescribed this med for the pt.  Thanks

## 2016-04-09 MED ORDER — LEVOTHYROXINE SODIUM 150 MCG PO TABS: 150.0000 ug | ORAL_TABLET | Freq: Every day | ORAL | Status: DC

## 2016-04-09 NOTE — Telephone Encounter (Signed)
Rx refilled and sent to pt's pharmacy. Confirmation received.

## 2016-04-22 ENCOUNTER — Other Ambulatory Visit: Payer: Self-pay | Admitting: Cardiology

## 2016-04-23 ENCOUNTER — Other Ambulatory Visit: Payer: Self-pay | Admitting: *Deleted

## 2016-04-23 MED ORDER — LEVOTHYROXINE SODIUM 150 MCG PO TABS: 150.0000 ug | ORAL_TABLET | Freq: Every day | ORAL | Status: DC

## 2016-04-29 DIAGNOSIS — M0589 Other rheumatoid arthritis with rheumatoid factor of multiple sites: Secondary | ICD-10-CM | POA: Diagnosis not present

## 2016-04-29 DIAGNOSIS — L409 Psoriasis, unspecified: Secondary | ICD-10-CM | POA: Diagnosis not present

## 2016-04-29 DIAGNOSIS — M150 Primary generalized (osteo)arthritis: Secondary | ICD-10-CM | POA: Diagnosis not present

## 2016-04-29 DIAGNOSIS — Z79899 Other long term (current) drug therapy: Secondary | ICD-10-CM | POA: Diagnosis not present

## 2016-04-29 DIAGNOSIS — I252 Old myocardial infarction: Secondary | ICD-10-CM | POA: Diagnosis not present

## 2016-04-30 DIAGNOSIS — M0589 Other rheumatoid arthritis with rheumatoid factor of multiple sites: Secondary | ICD-10-CM | POA: Diagnosis not present

## 2016-04-30 DIAGNOSIS — Z79899 Other long term (current) drug therapy: Secondary | ICD-10-CM | POA: Diagnosis not present

## 2016-05-02 ENCOUNTER — Other Ambulatory Visit: Payer: Self-pay | Admitting: Internal Medicine

## 2016-05-20 ENCOUNTER — Other Ambulatory Visit: Payer: Self-pay | Admitting: Internal Medicine

## 2016-05-28 DIAGNOSIS — M0589 Other rheumatoid arthritis with rheumatoid factor of multiple sites: Secondary | ICD-10-CM | POA: Diagnosis not present

## 2016-05-30 DIAGNOSIS — Z961 Presence of intraocular lens: Secondary | ICD-10-CM | POA: Diagnosis not present

## 2016-05-30 DIAGNOSIS — H35033 Hypertensive retinopathy, bilateral: Secondary | ICD-10-CM | POA: Diagnosis not present

## 2016-05-30 DIAGNOSIS — H40013 Open angle with borderline findings, low risk, bilateral: Secondary | ICD-10-CM | POA: Diagnosis not present

## 2016-05-30 DIAGNOSIS — H35362 Drusen (degenerative) of macula, left eye: Secondary | ICD-10-CM | POA: Diagnosis not present

## 2016-06-12 ENCOUNTER — Encounter: Payer: Self-pay | Admitting: Internal Medicine

## 2016-06-19 ENCOUNTER — Encounter: Payer: Self-pay | Admitting: Cardiology

## 2016-06-20 ENCOUNTER — Encounter: Payer: Self-pay | Admitting: Internal Medicine

## 2016-06-20 ENCOUNTER — Ambulatory Visit (INDEPENDENT_AMBULATORY_CARE_PROVIDER_SITE_OTHER): Payer: Medicare Other | Admitting: Internal Medicine

## 2016-06-20 VITALS — BP 140/70 | HR 75 | Temp 98.5°F | Ht 59.0 in | Wt 194.0 lb

## 2016-06-20 DIAGNOSIS — I2510 Atherosclerotic heart disease of native coronary artery without angina pectoris: Secondary | ICD-10-CM

## 2016-06-20 DIAGNOSIS — I214 Non-ST elevation (NSTEMI) myocardial infarction: Secondary | ICD-10-CM

## 2016-06-20 DIAGNOSIS — I10 Essential (primary) hypertension: Secondary | ICD-10-CM

## 2016-06-20 DIAGNOSIS — I5033 Acute on chronic diastolic (congestive) heart failure: Secondary | ICD-10-CM | POA: Diagnosis not present

## 2016-06-20 DIAGNOSIS — J8410 Pulmonary fibrosis, unspecified: Secondary | ICD-10-CM

## 2016-06-20 DIAGNOSIS — J209 Acute bronchitis, unspecified: Secondary | ICD-10-CM

## 2016-06-20 DIAGNOSIS — J9611 Chronic respiratory failure with hypoxia: Secondary | ICD-10-CM

## 2016-06-20 DIAGNOSIS — L4050 Arthropathic psoriasis, unspecified: Secondary | ICD-10-CM

## 2016-06-20 MED ORDER — PREDNISONE 10 MG PO TABS: 10.0000 mg | ORAL_TABLET | Freq: Two times a day (BID) | ORAL | 0 refills | Status: DC

## 2016-06-20 MED ORDER — AZITHROMYCIN 250 MG PO TABS: ORAL_TABLET | ORAL | 0 refills | Status: DC

## 2016-06-20 NOTE — Patient Instructions (Signed)
Hold citalopram for 7 days  Take your antibiotic as prescribed until ALL of it is gone, but stop if you develop a rash, swelling, or any side effects of the medication.  Contact our office as soon as possible if  there are side effects of the medication.  Limit your sodium (Salt) intake  Call or return to clinic prn if these symptoms worsen or fail to improve as anticipated.

## 2016-06-20 NOTE — Progress Notes (Signed)
Pre visit review using our clinic review tool, if applicable. No additional management support is needed unless otherwise documented below in the visit note. 

## 2016-06-20 NOTE — Progress Notes (Signed)
Subjective:    Patient ID: [name], female    DOB: 1946-08-02, 70 y.o.   MRN: [id number]  HPI  70 year old patient who is seen today with a chief complaint of worsening cough over the past 10-12 months.  She does have a history of mild interstitial lung disease and a history of asthma.  She has been treated with bronchodilators in the past.  Remains on Symbicort. Occasion.  She has mild sputum production. She also complains of some fleeting left the breast discomfort.  She has had fairly recent mammograms.  She has dyslipidemia which has been stable.  She continues to be followed by rheumatology.  Past Medical History:  Diagnosis Date  . Arthritis    OA RIGHT KNEE WITH PAIN  . Barrett esophagus   . Chronic respiratory failure (HCC)   . COPD (chronic obstructive pulmonary disease) (HCC)   . GERD (gastroesophageal reflux disease)   . History of ARDS 2006  . History of home oxygen therapy    AT NIGHT WHEN SLEEPING 2 L / MIN NASAL CANNULA  . Hypertension   . Hypothyroidism   . NSTEMI (non-ST elevated myocardial infarction) (HCC) 05/31/2015  . Pneumococcal pneumonia (HCC) 2006   HOSPITALIZED AND DEVELOPED ARDS  . Psoriatic arthritis (HCC)   . Pulmonary fibrosis (HCC)   . Rheumatic disease   . SOB (shortness of breath) on exertion      Social History   Social History  . Marital status: Married    Spouse name: N/A  . Number of children: N/A  . Years of education: N/A   Occupational History  . Floral Manager  [name]   Social History Main Topics  . Smoking status: Former Smoker    Packs/day: 1.50    Years: 58.50    Types: Cigarettes    Quit date: 03/03/2005  . Smokeless tobacco: Never Used  . Alcohol use No  . Drug use: No  . Sexual activity: Not on file   Other Topics Concern  . Not on file   Social History Narrative  . No narrative on file    Past Surgical History:  Procedure Laterality Date  . ABDOMINOPLASTY    . CARDIAC CATHETERIZATION N/A  06/01/2015   Procedure: Left Heart Cath and Coronary Angiography;  Surgeon: Runell Gess, MD;  Location: [facility] INVASIVE CV LAB;  Service: Cardiovascular;  Laterality: N/A;  . CARPAL TUNNEL RELEASE    . CHOLECYSTECTOMY    . CORONARY ARTERY BYPASS GRAFT N/A 06/04/2015   Procedure: CORONARY ARTERY BYPASS GRAFT times three            with left internal mammary artery and right leg saphenous vein;  Surgeon: Alleen Borne, MD;  Location: [facility] OR;  Service: Open Heart Surgery;  Laterality: N/A;  . cosmetic breast surgery    . JOINT REPLACEMENT    . KNEE ARTHROSCOPY Left   . TEE WITHOUT CARDIOVERSION  06/04/2015   Procedure: TRANSESOPHAGEAL ECHOCARDIOGRAM (TEE);  Surgeon: Alleen Borne, MD;  Location: [facility] OR;  Service: Open Heart Surgery;;  . TOTAL KNEE ARTHROPLASTY Left   . TOTAL KNEE ARTHROPLASTY Right 06/06/2013   Procedure: RIGHT TOTAL KNEE ARTHROPLASTY;  Surgeon: Loanne Drilling, MD;  Location: [facility] ORS;  Service: Orthopedics;  Laterality: Right;    Family History  Problem Relation Age of Onset  . Breast cancer Mother   . Coronary artery disease Father   . Rheum arthritis Father     No Known Allergies  Current  Outpatient Prescriptions on File Prior to Visit  Medication Sig Dispense Refill  . acetaminophen (TYLENOL ARTHRITIS PAIN) 650 MG CR tablet Take 1,300 mg by mouth every 8 (eight) hours as needed for pain. Per bottle directions as needed    . aspirin EC 325 MG EC tablet Take 1 tablet (325 mg total) by mouth daily. 30 tablet 0  . atorvastatin (LIPITOR) 80 MG tablet Take 1 tablet (80 mg total) by mouth daily at 6 PM. 90 tablet 3  . budesonide-formoterol (SYMBICORT) 160-4.5 MCG/ACT inhaler Inhale 2 puffs into the lungs 2 (two) times daily. 3 Inhaler 3  . Certolizumab Pegol (CIMZIA [place]) Inject into the skin as directed. Cimzia, Injected by provider as directed    . Cholecalciferol (VITAMIN D3) 2000 UNITS TABS Take 2,000 Int'l Units by mouth daily.    . citalopram (CELEXA) 40 MG tablet Take 1  tablet by mouth  every morning 90 tablet 3  . fluconazole (DIFLUCAN) 150 MG tablet 1 tablet weekly for 4 consecutive weeks 4 tablet 0  . furosemide (LASIX) 20 MG tablet As needed for weight gain and swelling 96 tablet 3  . levothyroxine (SYNTHROID) 150 MCG tablet Take 1 tablet (150 mcg total) by mouth daily before breakfast. 90 tablet 3  . losartan (COZAAR) 50 MG tablet Take 1 tablet (50 mg total) by mouth daily. 90 tablet 3  . NON FORMULARY Place 2 L into the nose as needed (sob with exertion).    Marland Kitchen omeprazole (PRILOSEC) 20 MG capsule Take 1 capsule (20 mg total) by mouth every morning. 90 capsule 1  . pantoprazole (PROTONIX) 40 MG tablet Take 1 tablet by mouth  daily 90 tablet 0  . traMADol (ULTRAM) 50 MG tablet TAKE 1-2 TABLETS EVERY 6 HOURS AS NEEDED FOR PAIN 360 tablet 3  . triamcinolone cream (KENALOG) 0.1 % Apply 1 application topically 2 (two) times daily as needed (for psorisis).     No current facility-administered medications on file prior to visit.     BP 140/70 (BP Location: Left Arm, Patient Position: Sitting, Cuff Size: Normal)   Pulse 75   Temp 98.5 F (36.9 C) (Oral)   Ht 4\' 11"  (1.499 m)   Wt 194 lb (88 kg)   SpO2 96%   BMI 39.18 kg/m     Review of Systems  Constitutional: Negative.   HENT: Negative for congestion, dental problem, hearing loss, rhinorrhea, sinus pressure, sore throat and tinnitus.   Eyes: Negative for pain, discharge and visual disturbance.  Respiratory: Positive for cough, shortness of breath and wheezing.   Cardiovascular: Negative for chest pain, palpitations and leg swelling.  Gastrointestinal: Negative for abdominal distention, abdominal pain, blood in stool, constipation, diarrhea, nausea and vomiting.  Genitourinary: Negative for difficulty urinating, dysuria, flank pain, frequency, hematuria, pelvic pain, urgency, vaginal bleeding, vaginal discharge and vaginal pain.  Musculoskeletal: Negative for arthralgias, gait problem and joint  swelling.  Skin: Negative for rash.  Neurological: Negative for dizziness, syncope, speech difficulty, weakness, numbness and headaches.  Hematological: Negative for adenopathy.  Psychiatric/Behavioral: Negative for agitation, behavioral problems and dysphoric mood. The patient is not nervous/anxious.        Objective:   Physical Exam  Constitutional: She is oriented to person, place, and time. She appears well-developed and well-nourished.  Obese No distress Blood pressure normal  HENT:  Head: Normocephalic.  Right Ear: External ear normal.  Left Ear: External ear normal.  Mouth/Throat: Oropharynx is clear and moist.  Eyes: Conjunctivae and EOM are normal. Pupils  are equal, round, and reactive to light.  Neck: Normal range of motion. Neck supple. No thyromegaly present.  Cardiovascular: Normal rate, regular rhythm, normal heart sounds and intact distal pulses.   Pulmonary/Chest: Effort normal.  Bibasal rales A few scattered coarse rhonchi and faint wheezing  O2 saturation 96  Abdominal: Soft. Bowel sounds are normal. She exhibits no mass. There is no tenderness.  Musculoskeletal: Normal range of motion.  Lymphadenopathy:    She has no cervical adenopathy.  Neurological: She is alert and oriented to person, place, and time.  Skin: Skin is warm and dry. No rash noted.  Psychiatric: She has a normal mood and affect. Her behavior is normal.          Assessment & Plan:   Mild interstitial lung disease Possible subacute bronchitis/history of asthma Chronic diastolic heart failure  Will treat with azithromycin and a short burst of prednisone.  Pulmonary follow-up if unimproved.  Continue bronchodilators  Rogelia Boga, MD

## 2016-06-25 DIAGNOSIS — M0589 Other rheumatoid arthritis with rheumatoid factor of multiple sites: Secondary | ICD-10-CM | POA: Diagnosis not present

## 2016-07-02 ENCOUNTER — Ambulatory Visit (INDEPENDENT_AMBULATORY_CARE_PROVIDER_SITE_OTHER): Payer: Medicare Other | Admitting: Cardiology

## 2016-07-02 VITALS — BP 124/74 | HR 88 | Ht 59.0 in | Wt 196.0 lb

## 2016-07-02 DIAGNOSIS — Z951 Presence of aortocoronary bypass graft: Secondary | ICD-10-CM | POA: Diagnosis not present

## 2016-07-02 DIAGNOSIS — E785 Hyperlipidemia, unspecified: Secondary | ICD-10-CM

## 2016-07-02 DIAGNOSIS — I2510 Atherosclerotic heart disease of native coronary artery without angina pectoris: Secondary | ICD-10-CM

## 2016-07-02 DIAGNOSIS — I5032 Chronic diastolic (congestive) heart failure: Secondary | ICD-10-CM

## 2016-07-02 DIAGNOSIS — I10 Essential (primary) hypertension: Secondary | ICD-10-CM

## 2016-07-02 DIAGNOSIS — R0600 Dyspnea, unspecified: Secondary | ICD-10-CM

## 2016-07-02 DIAGNOSIS — R0609 Other forms of dyspnea: Secondary | ICD-10-CM

## 2016-07-02 DIAGNOSIS — I5033 Acute on chronic diastolic (congestive) heart failure: Secondary | ICD-10-CM

## 2016-07-02 MED ORDER — FUROSEMIDE 20 MG PO TABS: 20.0000 mg | ORAL_TABLET | Freq: Every day | ORAL | 3 refills | Status: DC

## 2016-07-02 NOTE — Patient Instructions (Signed)
Medication Instructions:   START TAKING LASIX 20 MG ONCE DAILY    Labwork:  PRIOR TO YOUR 3 MONTH FOLLOW-UP APPOINTMENT WITH DR NELSON TO CHECK---CMET, CBC W DIFF, AND FASTING LIPIDS--PLEASE COME FASTING TO THIS LAB APPOINTMENT    Follow-Up:  3 MONTHS WITH DR NELSON---PLEASE HAVE YOUR LABS DONE PRIOR (A COUPLE DAYS OR A WEEK ) TO THIS OFFICE VISIT       If you need a refill on your cardiac medications before your next appointment, please call your pharmacy.

## 2016-07-02 NOTE — Progress Notes (Signed)
Patient ID: [name], female   DOB: [birth year], 70 y.o.   MRN: [id number]      Cardiology Office Note   Date:  07/02/2016   ID:  [name], DOB 07-Nov-1945, MRN [id number]  PCP:  Rogelia Boga, MD  Cardiologist: Tobias Alexander, MD   CC:    History of Present Illness: [name] is a 70 y.o. female who presents for post hospitalization visit after NSTEMI--> 3 VD --> emergent CABG in July 2016. She is mourning as her husband just passed away. She denies chest pain, SOB, no muscle pain, na palpitations, syncope, no LE edema, orthopnea, PND.  Patient comes after 2 months, at the last visit she complained that she has gained 15 pounds, today she again gained another 8 pounds. She has noticed to get short of breath with any activity but she attributes this to weight gain. She wakes up at night feeling short of breath. She has been having chronic cough for months on with white yellowish sputum. No fevers or chills. No lower extremity edema. No chest pain palpitations or syncope.   07/02/16 - she comes after 3 months, she continues to use home O2 at night and also when she has to do more than mild exertion. Stable SOB, her baseline for several years. She developed productive cough and received antibiotics and steroids from her PCP with some improvement. Denies LE edema, orthopnea or PND. No chest pain or palpitations. She is still very tearful when talking about [name].    Past Medical History:  Diagnosis Date  . Arthritis    OA RIGHT KNEE WITH PAIN  . Barrett esophagus   . Chronic respiratory failure (HCC)   . COPD (chronic obstructive pulmonary disease) (HCC)   . GERD (gastroesophageal reflux disease)   . History of ARDS 2006  . History of home oxygen therapy    AT NIGHT WHEN SLEEPING 2 L / MIN NASAL CANNULA  . Hypertension   . Hypothyroidism   . NSTEMI (non-ST elevated myocardial infarction) (HCC) 05/31/2015  . Pneumococcal pneumonia (HCC) 2006   HOSPITALIZED AND  DEVELOPED ARDS  . Psoriatic arthritis (HCC)   . Pulmonary fibrosis (HCC)   . Rheumatic disease   . SOB (shortness of breath) on exertion     Past Surgical History:  Procedure Laterality Date  . ABDOMINOPLASTY    . CARDIAC CATHETERIZATION N/A 06/01/2015   Procedure: Left Heart Cath and Coronary Angiography;  Surgeon: Runell Gess, MD;  Location: [facility] INVASIVE CV LAB;  Service: Cardiovascular;  Laterality: N/A;  . CARPAL TUNNEL RELEASE    . CHOLECYSTECTOMY    . CORONARY ARTERY BYPASS GRAFT N/A 06/04/2015   Procedure: CORONARY ARTERY BYPASS GRAFT times three            with left internal mammary artery and right leg saphenous vein;  Surgeon: Alleen Borne, MD;  Location: [facility] OR;  Service: Open Heart Surgery;  Laterality: N/A;  . cosmetic breast surgery    . JOINT REPLACEMENT    . KNEE ARTHROSCOPY Left   . TEE WITHOUT CARDIOVERSION  06/04/2015   Procedure: TRANSESOPHAGEAL ECHOCARDIOGRAM (TEE);  Surgeon: Alleen Borne, MD;  Location: [facility] OR;  Service: Open Heart Surgery;;  . TOTAL KNEE ARTHROPLASTY Left   . TOTAL KNEE ARTHROPLASTY Right 06/06/2013   Procedure: RIGHT TOTAL KNEE ARTHROPLASTY;  Surgeon: Loanne Drilling, MD;  Location: [facility] ORS;  Service: Orthopedics;  Laterality: Right;   Current Outpatient Prescriptions  Medication Sig Dispense Refill  .  acetaminophen (TYLENOL ARTHRITIS PAIN) 650 MG CR tablet Take 1,300 mg by mouth every 8 (eight) hours as needed for pain. Per bottle directions as needed    . aspirin EC 325 MG EC tablet Take 1 tablet (325 mg total) by mouth daily. 30 tablet 0  . atorvastatin (LIPITOR) 80 MG tablet Take 1 tablet (80 mg total) by mouth daily at 6 PM. 90 tablet 3  . budesonide-formoterol (SYMBICORT) 160-4.5 MCG/ACT inhaler Inhale 2 puffs into the lungs 2 (two) times daily. 3 Inhaler 3  . Certolizumab Pegol (CIMZIA [place]) Inject into the skin as directed. Cimzia, Injected by provider as directed    . Cholecalciferol (VITAMIN D3) 2000 UNITS TABS Take 2,000 Int'l Units by  mouth daily.    . citalopram (CELEXA) 40 MG tablet Take 1 tablet by mouth  every morning 90 tablet 3  . fluconazole (DIFLUCAN) 150 MG tablet 1 tablet weekly for 4 consecutive weeks 4 tablet 0  . furosemide (LASIX) 20 MG tablet Take 1 tablet (20 mg total) by mouth daily. 90 tablet 3  . levothyroxine (SYNTHROID) 150 MCG tablet Take 1 tablet (150 mcg total) by mouth daily before breakfast. 90 tablet 3  . losartan (COZAAR) 50 MG tablet Take 1 tablet (50 mg total) by mouth daily. 90 tablet 3  . NON FORMULARY Place 2 L into the nose as needed (sob with exertion).    Marland Kitchen omeprazole (PRILOSEC) 20 MG capsule Take 1 capsule (20 mg total) by mouth every morning. 90 capsule 1  . pantoprazole (PROTONIX) 40 MG tablet Take 1 tablet by mouth  daily 90 tablet 0  . traMADol (ULTRAM) 50 MG tablet TAKE 1-2 TABLETS EVERY 6 HOURS AS NEEDED FOR PAIN 360 tablet 3  . triamcinolone cream (KENALOG) 0.1 % Apply 1 application topically 2 (two) times daily as needed (for psorisis).     No current facility-administered medications for this visit.    Allergies:   Review of patient's allergies indicates no known allergies.   Social History:  The patient  reports that she quit smoking about 11 years ago. Her smoking use included Cigarettes. She has a 87.75 pack-year smoking history. She has never used smokeless tobacco. She reports that she does not drink alcohol or use drugs.   Family History:  The patient's family history includes Breast cancer in her mother; Coronary artery disease in her father; Rheum arthritis in her father.   ROS:  Please see the history of present illness.  All other systems are reviewed and negative.   PHYSICAL EXAM: VS:  BP 124/74   Pulse 88   Ht 4\' 11"  (1.499 m)   Wt 196 lb (88.9 kg)   BMI 39.59 kg/m  , BMI Body mass index is 39.59 kg/m. GEN: Well nourished, well developed, in no acute distress  HEENT: normal  Neck: no JVD, carotid bruits, or masses Cardiac: RRR; no 3/6 systolic murmur, also  rub, no gallops , no LE edema  Respiratory:  Mild Crackles at the right base. GI: soft, nontender, nondistended, + BS MS: no deformity or atrophy  Skin: warm and dry, no rash Neuro:  Strength and sensation are intact Psych: euthymic mood, full affect  EKG:  SRLAE, iRBBB, anterior MI age undetermined  Recent Labs: 07/12/2015: ALT 15 03/18/2016: Hemoglobin 12.2; Platelets 212; TSH 2.06 03/26/2016: Brain Natriuretic Peptide 44.3; BUN 27; Creat 0.88; Potassium 4.9; Sodium 137   Lipid Panel    Component Value Date/Time   CHOL 117 07/12/2015 1508   TRIG 75  07/12/2015 1508   HDL 51 07/12/2015 1508   CHOLHDL 4.4 06/01/2015 0400   VLDL 25 06/01/2015 0400   LDLCALC 51 07/12/2015 1508   LDLDIRECT 131.1 08/10/2012 0829   Wt Readings from Last 3 Encounters:  07/02/16 196 lb (88.9 kg)  06/20/16 194 lb (88 kg)  03/26/16 193 lb 12.8 oz (87.9 kg)    ECHO: 06/01/2015 - Left ventricle: The cavity size was normal. Wall thickness was increased in a pattern of mild LVH. Systolic function was normal. The estimated ejection fraction was in the range of 55% to 60%. There is hypokinesis of the basalinferolateral myocardium. Doppler parameters are consistent with abnormal left ventricular relaxation (grade 1 diastolic dysfunction). - Mitral valve: Calcified annulus. Mildly thickened leaflets .  Impressions: - Hypokinesis of the basal/mild inferior lateral wall with overall preserved LV function; grade 1 diastolic dysfunction; mild LVH; trace MR.  TELEMETRY:  SR  TTE: 06/01/15 Study Conclusions  - Left ventricle: The cavity size was normal. Wall thickness was increased in a pattern of mild LVH. Systolic function was normal. The estimated ejection fraction was in the range of 55% to 60%. There is hypokinesis of the basalinferolateral myocardium. Doppler parameters are consistent with abnormal left ventricular relaxation (grade 1 diastolic dysfunction). - Mitral valve:  Calcified annulus. Mildly thickened leaflets .  Impressions:  - Hypokinesis of the basal/mild inferior lateral wall with overall preserved LV function; grade 1 diastolic dysfunction; mild LVH; trace MR.  TTE: 09/07/2015 - Left ventricle: The cavity size was normal. There was moderate concentric hypertrophy. Systolic function was normal. The estimated ejection fraction was in the range of 60% to 65%. Wall motion was normal; there were no regional wall motion abnormalities. Doppler parameters are consistent with abnormal left ventricular relaxation (grade 1 diastolic dysfunction). There was no evidence of elevated ventricular filling pressure by Doppler parameters. - Aortic valve: There was no regurgitation. - Aortic root: The aortic root was normal in size. - Mitral valve: Calcified annulus. Mildly thickened leaflets . - Left atrium: The atrium was mildly dilated. - Right ventricle: The cavity size was normal. Wall thickness was normal. Systolic function was normal. - Right atrium: The atrium was normal in size. - Tricuspid valve: There was trivial regurgitation. - Pulmonic valve: There was no regurgitation. - Pulmonary arteries: Systolic pressure was within the normal range. - Inferior vena cava: The vessel was normal in size. - Pericardium, extracardiac: There was no pericardial effusion.  Impressions:  - There is mild basal, mid inferior wall hypokinesis, overall LVEF normal.  EKG performed today 02/19/2016 showed normal sinus rhythm rightward axis incomplete right bundle branch block and prior anterior MI.   ASSESSMENT AND PLAN:  1. CAD, s/p NSTEMI (non-ST elevated myocardial infarction), 3-VD with preserved LV function. Repeat echo showed LVEF 60-65%. On aspirin and atorvastatin, losartan, metoprolol discontinued as she was bradycardic.  No chest and and stable baseline dyspnea.  2. Dyspnea - stable- chronic pulmonary fibrosis on home O2.  -  patient has evidence of mild fluid overload, I will restart low dose lasix 20 mg po daily  3. HTN - controlled, continue same regimen..   4. Hyperlipidemia - on atorvastatin, LFTs normal.  5. Hypothyroidism - TSH 12 in 11/16, 8 in 1/17, increased Synthroid to 150 mcg daily. We will recheck.  6. Chronic bronchitis, uses Symbicort infrequently advised to use it twice a day, she uses oxygen at night.  Follow up in 3 months  Signed, Tobias Alexander, MD  07/02/2016 10:31 PM  [facility] [address], [place], [place]  [address] Phone: [phone]; Fax: [phone]

## 2016-07-22 ENCOUNTER — Ambulatory Visit (INDEPENDENT_AMBULATORY_CARE_PROVIDER_SITE_OTHER)
Admission: RE | Admit: 2016-07-22 | Discharge: 2016-07-22 | Disposition: A | Discharge status: Home / Self Care | Payer: Medicare Other | Source: Ambulatory Visit | Attending: Internal Medicine | Admitting: Internal Medicine

## 2016-07-22 ENCOUNTER — Ambulatory Visit (INDEPENDENT_AMBULATORY_CARE_PROVIDER_SITE_OTHER): Payer: Medicare Other | Admitting: Internal Medicine

## 2016-07-22 ENCOUNTER — Encounter: Payer: Self-pay | Admitting: Internal Medicine

## 2016-07-22 VITALS — BP 120/66 | HR 82 | Ht 59.5 in | Wt 201.0 lb

## 2016-07-22 DIAGNOSIS — S22000D Wedge compression fracture of unspecified thoracic vertebra, subsequent encounter for fracture with routine healing: Secondary | ICD-10-CM

## 2016-07-22 DIAGNOSIS — R05 Cough: Secondary | ICD-10-CM | POA: Diagnosis not present

## 2016-07-22 DIAGNOSIS — J4530 Mild persistent asthma, uncomplicated: Secondary | ICD-10-CM | POA: Diagnosis not present

## 2016-07-22 DIAGNOSIS — I2510 Atherosclerotic heart disease of native coronary artery without angina pectoris: Secondary | ICD-10-CM

## 2016-07-22 DIAGNOSIS — J9611 Chronic respiratory failure with hypoxia: Secondary | ICD-10-CM | POA: Diagnosis not present

## 2016-07-22 MED ORDER — PANTOPRAZOLE SODIUM 40 MG PO TBEC: DELAYED_RELEASE_TABLET | ORAL | 0 refills | Status: DC

## 2016-07-22 MED ORDER — PREDNISONE 10 MG PO TABS: ORAL_TABLET | ORAL | 0 refills | Status: DC

## 2016-07-22 MED ORDER — ALBUTEROL SULFATE HFA 108 (90 BASE) MCG/ACT IN AERS: INHALATION_SPRAY | RESPIRATORY_TRACT | 11 refills | Status: DC

## 2016-07-22 MED ORDER — FAMOTIDINE 20 MG PO TABS: ORAL_TABLET | ORAL | Status: DC

## 2016-07-22 NOTE — Progress Notes (Signed)
Subjective:   Patient ID: [name], female    DOB: 21-Nov-1945    MRN: [id number]     Brief patient profile:  58 yowf quit smoking in May 2006 with dx pna/ ards resumed full activity including yardwork at wt 140 and pft's c/w restrictive changes 04/2010 at wt 170 but reported improvement on saba so maintained chronically on symbicort 160 despite no sign airflow obst on pfts which was confirmed  06/04/15    History of Present Illness  July 18, 2010  1st pulmonary office eval in ER era c/o doe x 6 months progressive indolent onset with copd vs pf.  Ultimately required hosp 8/30-9/1 dx copd/pf ? cause on sulfasalzine and ACE inhib better on 02 and advair but very hoarse with sense of chest congestion and cough with white mucus day > night.  doe x > slow adls.stop advair Start symbicort 160 2 puffs first thing  in am and 2 puffs again in pm about 12 hours later  Work on inhaler technique:   stop benazapril start benicar 40/25  one daily Use 02 sleeping and any activity other than sitting still   04/10/2014 f/u ov/Deneene Tarver re: restrictive lung dz/ RA/psoriatric/ symbicort 160 2bid  Chief Complaint  Patient presents with  . Follow-up    Pt states was advised to f/u per [employer].  Pt has been having increased SOB- relates to stress from dealing with her spouse's illness. She is using o2 pretty much 24/7.   trying to do more than baseline activity and finds she needs 02 more. rec Work on inhaler technique:   Ok to just use the symbicort 160 2 puffs in am to see what difference if any this makes in your breathing  Change 02 to 2lpm 24/7 and use 3lpm with activity    06/04/15  cabg     07/22/2016  f/u ov/[name] re: PF sp ali/ AB/ symbicort 160 2bid on 02 2lpm at rest/ 3lpm with activity  Chief Complaint  Patient presents with  . Follow-up    Breathing is "not as good as it could be".  Breathing has been worse for the past 2 months. She gets winded just making the bed. She is also c/o prod cough  with yellow sputum and occ wheezing.   sob gradually worse x 2 m already tried on abx/pred per PC no better, using omeprazole 20mg  9am/ does not have a rescue ihaler at all    No obvious daytime variabilty or assoc  chest tightness,  overt sinus or hb symptoms. No unusual exp hx or h/o childhood pna/ asthma or knowledge of premature birth.   Sleeping ok on 02 2lpm without nocturnal  or early am exacerbation  of respiratory  c/o's or need for noct saba. Also denies any obvious fluctuation of symptoms with weather or environmental changes or other aggravating or alleviating factors except as outlined above   Current Medications, Allergies,   Past Surgical History, Family History, and Social History were reviewed in record.  ROS  The following are not active complaints unless bolded sore throat, dysphagia, dental problems, itching, sneezing,  nasal congestion or excess/ purulent secretions, ear ache,   fever, chills, sweats, unintended wt loss, pleuritic or exertional cp, hemoptysis,  orthopnea pnd or leg swelling, presyncope, palpitations, heartburn, abdominal pain, anorexia, nausea, vomiting, diarrhea  or change in bowel or urinary habits, change in stools or urine, dysuria,hematuria,  rash, arthralgias, visual complaints, headache, numbness weakness or ataxia or problems  with walking or coordination,  change in mood/affect or memory.        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       - CT chest 07/03/10 Nonspecific PF mostly upper lobes       - CT chest 12/03/10 acute gg changes and effusions c/w  chf        - PFT's  04/12/10 FEV1  1.21 (69%) ratio 77 and no change p B2,  DLC0 56% Asthmatic bronchitis     - HFA 50% p coaching July 18, 2010 >>  90%  02/25/2011  history of mild vitamin D deficiency        Objective:   Physical Exam   amb   mod obese wf  nad  Vital signs reviewed  -  Note sats 91% RA on arrival      wt 192 July 18, 2010 > 197 August 21, 2010 > 207 December 05, 2010  > 183  01/21/2011  > 179 02/25/2011 >189 08/10/2012 >195 09/08/12 >200 12/27/12 > 202 05/03/2013 > 06/28/2013  199 > 08/08/13 194 > 04/10/2014  203 > 04/19/2015   176 >  07/23/2015 166   HEENT mild turbinate edema.  Oropharynx no thrush or excess pnd or cobblestoning.  No JVD or cervical adenopathy. Mild accessory muscle hypertrophy. Trachea midline, nl thryroid.  Trace wheeze late exp bilaterally . Regular rate and rhythm without murmur gallop or rub or increase P2 or edema.  Abd: no hsm, nl excursion. Ext warm without cyanosis or clubbing. Skin with classic psoriatic changes both hands      I personally reviewed images and agree with radiology impression as follows:  CTa chest:  07/23/2015  1. No evidence of pulmonary embolus. 2. Mild chronic interstitial lung disease. 3. There is an age-indeterminate T4 vertebral body compression fracture.    CXR PA and Lateral:   07/22/2016 :    I personally reviewed images and agree with radiology impression as follows:    1. No acute cardiopulmonary disease. 2. Stable changes from CABG surgery. 3. Findings of chronic interstitial lung disease.

## 2016-07-22 NOTE — Patient Instructions (Addendum)
Prednisone 10 mg take  4 each am x 2 days,   2 each am x 2 days,  1 each am x 2 days and stop   Plan A = Automatic = Symbicort 160 Take 2 puffs first thing in am and then another 2 puffs about 12 hours later.   Work on inhaler technique:  relax and gently blow all the way out then take a nice smooth deep breath back in, triggering the inhaler at same time you start breathing in.  Hold for up to 5 seconds if you can. Blow out thru nose. Rinse and gargle with water when done  Plan B = Backup Only use your albuterol(proair)  as a rescue medication to be used if you can't catch your breath by resting or doing a relaxed purse lip breathing pattern.  - The less you use it, the better it will work when you need it. - Ok to use the inhaler up to 2 puffs  every 4 hours if you must but call for appointment if use goes up over your usual need - Don't leave home without it !!  (think of it like the spare tire for your car)    Pantoprazole (protonix) 40 mg (same as omeprzole 20 mg x2)   Take  30-60 min before first meal of the day and Pepcid (famotidine)  20 mg one @  bedtime until return to office - this is the best way to tell whether stomach acid is contributing to your problem.    GERD (REFLUX)  is an extremely common cause of respiratory symptoms just like yours , many times with no obvious heartburn at all.    It can be treated with medication, but also with lifestyle changes including elevation of the head of your bed (ideally with 6 inch  bed blocks),  Smoking cessation, avoidance of late meals, excessive alcohol, and avoid fatty foods, chocolate, peppermint, colas, red wine, and acidic juices such as orange juice.  NO MINT OR MENTHOL PRODUCTS SO NO COUGH DROPS   USE SUGARLESS CANDY INSTEAD (Jolley ranchers or Stover's or Life Savers) or even ice chips will also do - the key is to swallow to prevent all throat clearing. NO OIL BASED VITAMINS - use powdered substitutes.  Please see patient  coordinator before you leave today  to schedule portable 02 evaluation  Please remember to go to the  x-ray department downstairs for your tests - we will call you with the results when they are available.    Please schedule a follow up office visit in 6 weeks, call sooner if needed - add needs FENO on return

## 2016-07-23 DIAGNOSIS — M0589 Other rheumatoid arthritis with rheumatoid factor of multiple sites: Secondary | ICD-10-CM | POA: Diagnosis not present

## 2016-07-23 NOTE — Progress Notes (Signed)
Spoke with pt and notified of results per Dr. Wert. Pt verbalized understanding and denied any questions. 

## 2016-07-24 NOTE — Assessment & Plan Note (Signed)
See CT chest 07/23/2015 > referred to orthopedics > did not go   No apparent sequelae

## 2016-07-24 NOTE — Assessment & Plan Note (Signed)
-   PFT's  06/04/15  FEV1 1.20 (67 % ) ratio 83  p 6 % improvement from saba with DLCO  80 % corrects to 132 % for alv volume      - Clinical dx based on response to symbicort     - 07/24/2016 p extensive coaching HFA effectiveness =    75% (tii too short)  DDX of  difficult airways management almost all start with A and  include Adherence, Ace Inhibitors, Acid Reflux, Active Sinus Disease, Alpha 1 Antitripsin deficiency, Anxiety masquerading as Airways dz,  ABPA,  Allergy(esp in young), Aspiration (esp in elderly), Adverse effects of meds,  Active smokers, A bunch of PE's (a small clot burden can't cause this syndrome unless there is already severe underlying pulm or vascular dz with poor reserve) plus two Bs  = Bronchiectasis and Beta blocker use..and one C= CHF  Adherence is always the initial "prime suspect" and is a multilayered concern that requires a "trust but verify" approach in every patient - starting with knowing how to use medications, especially inhalers, correctly, keeping up with refills and understanding the fundamental difference between maintenance and prns vs those medications only taken for a very short course and then stopped and not refilled.  - confused with timing of meds/ does not have saba rescue  ? Acid (or non-acid) GERD > always difficult to exclude as up to 75% of pts in some series report no assoc GI/ Heartburn symptoms> rec max (24h)  acid suppression and diet restrictions/ reviewed and instructions given in writing.   ? Allergy > Prednisone 10 mg take  4 each am x 2 days,   2 each am x 2 days,  1 each am x 2 days and stop   ? Anxiety related to obesity / deconditioning > ? Should consider Rehab    For now optimize rx for AB then return > FENO then    I had an extended discussion with the patient reviewing all relevant studies completed to date and  lasting 15 to 20 minutes of a 25 minute visit    Each maintenance medication was reviewed in detail including most  importantly the difference between maintenance and prns and under what circumstances the prns are to be triggered using an action plan format that is not reflected in the computer generated alphabetically organized AVS.    Please see instructions for details which were reviewed in writing and the patient given a copy highlighting the part that I personally wrote and discussed at today's ov.

## 2016-07-24 NOTE — Assessment & Plan Note (Signed)
-   02 dependent  since 07/02/10 >>  83% RA December 05, 2010       - ONO RA 08/05/12  :  Positive sat < 89 x 2:77m> repeat on 2lpm rec 08/12/2012  - 06/17/2013 reported desat with activity p Knee surgery > rec restart 2lpm with activity  - 06/27/2013   Walked 2lpm  x one lap @ 185 stopped due to sat 88% not sob , desat to 82% on RA just at the  Start of the walk  - 04/10/14  Sats  81% RA and then  Walked 3lpm x  2 laps @ 185 ft each stopped due to end of study, no desat   07/22/2016    rec 2lpm at rest/ 3lpm with activity> referred for POC

## 2016-07-31 ENCOUNTER — Telehealth: Payer: Self-pay | Admitting: Internal Medicine

## 2016-07-31 NOTE — Telephone Encounter (Signed)
Called spoke with Brad at [facility]. He is requesting clarification on sig for pantoprazole. I reviewed rx as  Sig: Take 30-60 min before first meal of the day He voiced understanding and had no further questions.

## 2016-08-08 ENCOUNTER — Telehealth: Payer: Self-pay | Admitting: Cardiology

## 2016-08-08 DIAGNOSIS — R7989 Other specified abnormal findings of blood chemistry: Secondary | ICD-10-CM

## 2016-08-08 DIAGNOSIS — E032 Hypothyroidism due to medicaments and other exogenous substances: Secondary | ICD-10-CM

## 2016-08-08 MED ORDER — FUROSEMIDE 20 MG PO TABS: 20.0000 mg | ORAL_TABLET | Freq: Two times a day (BID) | ORAL | 3 refills | Status: DC

## 2016-08-08 MED ORDER — ATORVASTATIN CALCIUM 80 MG PO TABS: 80.0000 mg | ORAL_TABLET | Freq: Every day | ORAL | 3 refills | Status: DC

## 2016-08-08 NOTE — Telephone Encounter (Signed)
New message      Talk to [name] about her medications.  Her list does not match her actual pill bottles.  Please call

## 2016-08-08 NOTE — Telephone Encounter (Signed)
Pt calling to have her atorvastatin 80 mg po daily and lasix 20 mg po bid sent to [facility] for a 90 day supply.  Switched the pts pharmacy to [facility] and submitted this for a 90 day supply.  Pt verbalized understanding and gracious for all the assistance provided.

## 2016-08-13 ENCOUNTER — Other Ambulatory Visit: Payer: Self-pay | Admitting: Family Medicine

## 2016-08-13 ENCOUNTER — Telehealth: Payer: Self-pay | Admitting: Internal Medicine

## 2016-08-13 DIAGNOSIS — J9611 Chronic respiratory failure with hypoxia: Secondary | ICD-10-CM

## 2016-08-13 NOTE — Telephone Encounter (Signed)
Spoke with the pt  She states that her refill has been taking care of  She is now wanting to know if MW will be okay with faxing order to [facility] for new POC  She wants the "G4" model  She Is going to pay out of pocket for this, and keep back up from local DME here  Fax number for [facility] [phone] Nelda Severe (phone number [phone]) Please advise thanks

## 2016-08-13 NOTE — Telephone Encounter (Signed)
Order placed.  Pt aware.  Nothing further needed.  

## 2016-08-13 NOTE — Telephone Encounter (Signed)
Fine with me

## 2016-08-21 DIAGNOSIS — M0589 Other rheumatoid arthritis with rheumatoid factor of multiple sites: Secondary | ICD-10-CM | POA: Diagnosis not present

## 2016-09-03 ENCOUNTER — Ambulatory Visit: Payer: Medicare Other | Admitting: Internal Medicine

## 2016-09-10 ENCOUNTER — Ambulatory Visit: Payer: Medicare Other | Admitting: Internal Medicine

## 2016-09-16 ENCOUNTER — Other Ambulatory Visit (INDEPENDENT_AMBULATORY_CARE_PROVIDER_SITE_OTHER): Payer: Medicare Other

## 2016-09-16 ENCOUNTER — Ambulatory Visit (INDEPENDENT_AMBULATORY_CARE_PROVIDER_SITE_OTHER): Payer: Medicare Other | Admitting: Internal Medicine

## 2016-09-16 ENCOUNTER — Encounter: Payer: Self-pay | Admitting: Internal Medicine

## 2016-09-16 VITALS — BP 140/80 | HR 94 | Ht 59.5 in | Wt 201.4 lb

## 2016-09-16 DIAGNOSIS — I2510 Atherosclerotic heart disease of native coronary artery without angina pectoris: Secondary | ICD-10-CM | POA: Diagnosis not present

## 2016-09-16 DIAGNOSIS — J45991 Cough variant asthma: Secondary | ICD-10-CM | POA: Diagnosis not present

## 2016-09-16 DIAGNOSIS — J9611 Chronic respiratory failure with hypoxia: Secondary | ICD-10-CM | POA: Diagnosis not present

## 2016-09-16 DIAGNOSIS — I2583 Coronary atherosclerosis due to lipid rich plaque: Secondary | ICD-10-CM

## 2016-09-16 LAB — CBC WITH DIFFERENTIAL/PLATELET
Basophils Absolute: 0 10*3/uL (ref 0.0–0.1)
Basophils Relative: 0.5 % (ref 0.0–3.0)
Eosinophils Absolute: 0.5 10*3/uL (ref 0.0–0.7)
Eosinophils Relative: 6.1 % — ABNORMAL HIGH (ref 0.0–5.0)
HCT: 36.6 % (ref 36.0–46.0)
Hemoglobin: 12 g/dL (ref 12.0–15.0)
Lymphocytes Relative: 24.1 % (ref 12.0–46.0)
Lymphs Abs: 2.1 10*3/uL (ref 0.7–4.0)
MCHC: 32.7 g/dL (ref 30.0–36.0)
MCV: 91.4 fl (ref 78.0–100.0)
Monocytes Absolute: 0.9 10*3/uL (ref 0.1–1.0)
Monocytes Relative: 10 % (ref 3.0–12.0)
Neutro Abs: 5.2 10*3/uL (ref 1.4–7.7)
Neutrophils Relative %: 59.3 % (ref 43.0–77.0)
Platelets: 221 10*3/uL (ref 150.0–400.0)
RBC: 4.01 Mil/uL (ref 3.87–5.11)
RDW: 14.6 % (ref 11.5–15.5)
WBC: 8.7 10*3/uL (ref 4.0–10.5)

## 2016-09-16 LAB — NITRIC OXIDE: Nitric Oxide: 98

## 2016-09-16 MED ORDER — PREDNISONE 10 MG PO TABS: ORAL_TABLET | ORAL | 0 refills | Status: DC

## 2016-09-16 MED ORDER — MONTELUKAST SODIUM 10 MG PO TABS: 10.0000 mg | ORAL_TABLET | Freq: Every day | ORAL | 3 refills | Status: DC

## 2016-09-16 NOTE — Patient Instructions (Addendum)
Plan A = Automatic = symicort 160 Take 2 puffs first thing in am and then another 2 puffs about 12 hours later.                                      Add singulair 10 mg one each pm  Plan B = Backup Only use your albuterol as a rescue medication to be used if you can't control the cough / catch your breath by resting or doing a relaxed purse lip breathing pattern.  - The less you use it, the better it will work when you need it. - Ok to use the inhaler up to 2 puffs  every 4 hours if you must but call for appointment if use goes up over your usual need - Don't leave home without it !!  (think of it like the spare tire for your car)   Please remember to go to the lab   department downstairs for your tests - we will call you with the results when they are available.  Please schedule a follow up office visit in 6 weeks, call sooner if needed

## 2016-09-16 NOTE — Progress Notes (Signed)
Subjective:   Patient ID: [name], female    DOB: 23-Feb-1946    MRN: [id number]     Brief patient profile:  52 yowf quit smoking in May 2006 with dx pna/ ards resumed full activity including yardwork at wt 140 and pft's c/w restrictive changes 04/2010 at wt 170 but reported improvement on saba so maintained chronically on symbicort 160 despite no sign airflow obst on pfts which was confirmed  06/04/15    History of Present Illness  July 18, 2010  1st pulmonary office eval in ER era c/o doe x 6 months progressive indolent onset with copd vs pf.  Ultimately required hosp 8/30-9/1 dx copd/pf ? cause on sulfasalzine and ACE inhib better on 02 and advair but very hoarse with sense of chest congestion and cough with white mucus day > night.  doe x > slow adls.stop advair Start symbicort 160 2 puffs first thing  in am and 2 puffs again in pm about 12 hours later  Work on inhaler technique:   stop benazapril start benicar 40/25  one daily Use 02 sleeping and any activity other than sitting still   04/10/2014 f/u ov/Wert re: restrictive lung dz/ RA/psoriatric/ symbicort 160 2bid  Chief Complaint  Patient presents with  . Follow-up    Pt states was advised to f/u per [employer].  Pt has been having increased SOB- relates to stress from dealing with her spouse's illness. She is using o2 pretty much 24/7.   trying to do more than baseline activity and finds she needs 02 more. rec Work on inhaler technique:   Ok to just use the symbicort 160 2 puffs in am to see what difference if any this makes in your breathing  Change 02 to 2lpm 24/7 and use 3lpm with activity    06/04/15  cabg     07/22/2016  f/u ov/Wert re: PF sp ali/ AB/ symbicort 160 2bid on 02 2lpm at rest/ 3lpm with activity  Chief Complaint  Patient presents with  . Follow-up    Breathing is "not as good as it could be".  Breathing has been worse for the past 2 months. She gets winded just making the bed. She is also c/o prod cough  with yellow sputum and occ wheezing.   sob gradually worse x 2 m already tried on abx/pred per PC no better, using omeprazole 20mg  9am/ does not have a rescue inhaler at all  rec Prednisone 10 mg take  4 each am x 2 days,   2 each am x 2 days,  1 each am x 2 days and stop  Plan A = Automatic = Symbicort 160 Take 2 puffs first thing in am and then another 2 puffs about 12 hours later.  Work on inhaler technique:  Plan B = Backup Only use your albuterol(proair)  as a rescue medication Pantoprazole (protonix) 40 mg (same as omeprzole 20 mg x2)   Take  30-60 min before first meal of the day and Pepcid (famotidine)  20 mg one @  bedtime until return to office - this is the best way to tell whether stomach acid is contributing to your problem.   GERD (REFLUX)  is an extremely common  Please see patient coordinator before you leave today  to schedule portable 02 evaluation Please remember to go to the  x-ray department downstairs for your tests - we will call you with the results when they are available.   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 Follow-up    Breathing has improved some. She is coughing less, but still coughing up beige sputum.  She has not used albuterol inhaler.   cough assoc with variable nasal stuffiness / much better with pred  Worse with laughing / not typically noct but some at hs  pred helps the cough but not the doe     No obvious daytime variabilty or assoc  chest tightness,  overt sinus or hb symptoms. No unusual exp hx or h/o childhood pna/ asthma or knowledge of premature birth.   Sleeping ok on 02 2lpm without nocturnal  or early am exacerbation  of respiratory  c/o's or need for noct saba. Also denies any obvious fluctuation of symptoms with weather or environmental changes or other aggravating or alleviating factors except as outlined  above   Current Medications, Allergies,   Past Surgical History, Family History, and Social History were reviewed in [employer] record.  ROS  The following are not active complaints unless bolded sore throat, dysphagia, dental problems, itching, sneezing,  nasal congestion or excess/ purulent secretions, ear ache,   fever, chills, sweats, unintended wt loss, pleuritic or exertional cp, hemoptysis,  orthopnea pnd or leg swelling, presyncope, palpitations, heartburn, abdominal pain, anorexia, nausea, vomiting, diarrhea  or change in bowel or urinary habits, change in stools or urine, dysuria,hematuria,  rash, arthralgias baseline , visual complaints, headache, numbness weakness or ataxia or problems with walking or coordination,  change in mood/affect or memory.        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       - CT chest 07/03/10 Nonspecific PF mostly upper lobes       - CT chest 12/03/10 acute gg changes and effusions c/w chf        - PFT's  04/12/10 FEV1  1.21 (69%) ratio 77 and no change p B2,  DLC0 56% Asthmatic bronchitis     - HFA 50% p coaching July 18, 2010 >>  90%  02/25/2011  history of mild vitamin D deficiency        Objective:   Physical Exam   amb  mod obese wf  nad  Vital signs reviewed  -  Note sats 96% 2lpm on arrival      wt 192 July 18, 2010 > 197 August 21, 2010 > 207 December 05, 2010  > 183  01/21/2011  > 179 02/25/2011 >189 08/10/2012 >195 09/08/12 >200 12/27/12 > 202 05/03/2013 > 06/28/2013  199 > 08/08/13 194 > 04/10/2014  203 > 04/19/2015   176 >  07/23/2015 166 > 09/16/2016   210  HEENT mild turbinate edema.  Top denture  Oropharynx no thrush or excess pnd or cobblestoning.  No JVD or cervical adenopathy. Mild accessory muscle hypertrophy. Trachea midline, nl thryroid.  Trace wheeze end exp bilaterally . Regular rate and rhythm without murmur gallop or rub or increase P2 or edema.  Abd: no hsm, nl excursion. Ext warm without cyanosis or clubbing. Skin with classic psoriatic changes both hands       CXR PA and Lateral:   07/22/2016 :    I personally reviewed images and agree with radiology impression as follows:    1. No acute cardiopulmonary disease. 2. Stable changes from CABG surgery. 3. Findings of chronic interstitial lung disease.   Labs ordered 09/16/2016  Allergy profile/cbc with diff

## 2016-09-17 LAB — RESPIRATORY ALLERGY PROFILE REGION II ~~LOC~~
Allergen, A. alternata, m6: <0.1 kU/L
Allergen, C. Herbarum, M2: <0.1 kU/L
Allergen, Cedar tree, t12: <0.1 kU/L
Allergen, Comm Silver Birch, t9: <0.1 kU/L
Allergen, Cottonwood, t14: <0.1 kU/L
Allergen, D pternoyssinus,d7: <0.1 kU/L
Allergen, Mouse Urine Protein, e78: <0.1 kU/L
Allergen, Mulberry, t76: <0.1 kU/L
Allergen, Oak,t7: <0.1 kU/L
Allergen, P. notatum, m1: <0.1 kU/L
Aspergillus fumigatus, m3: <0.1 kU/L
Bermuda Grass: <0.1 kU/L
Box Elder IgE: <0.1 kU/L
Cat Dander: <0.1 kU/L
Cockroach: <0.1 kU/L
Common Ragweed: <0.1 kU/L
D. farinae: <0.1 kU/L
Dog Dander: <0.1 kU/L
Elm IgE: <0.1 kU/L
IgE (Immunoglobulin E), Serum: 78 kU/L (ref <115)
Johnson Grass: <0.1 kU/L
Pecan/Hickory Tree IgE: <0.1 kU/L
Rough Pigweed  IgE: <0.1 kU/L
Sheep Sorrel IgE: <0.1 kU/L
Timothy Grass: <0.1 kU/L

## 2016-09-17 NOTE — Assessment & Plan Note (Addendum)
Followed in Pulmonary clinic/ [facility]/ Wert  - PFT's  06/04/15  FEV1 1.20 (67 % ) ratio 83  p 6 % improvement from saba with DLCO  80 % corrects to 132 % for alv volume      - Clinical dx based on response to symbicort   - 09/16/2016  After extensive coaching HFA effectiveness = 90%     FENO 09/16/2016  =   96 on symbicort 160 2bid > add singulair  Allergy profile 09/16/2016 >  Eos 0.5 /  IgE  78 neg RAST    DDX of  difficult airways management almost all start with A and  include Adherence, Ace Inhibitors, Acid Reflux, Active Sinus Disease, Alpha 1 Antitripsin deficiency, Anxiety masquerading as Airways dz,  ABPA,  Allergy(esp in young), Aspiration (esp in elderly), Adverse effects of meds,  Active smokers, A bunch of PE's (a small clot burden can't cause this syndrome unless there is already severe underlying pulm or vascular dz with poor reserve) plus two Bs  = Bronchiectasis and Beta blocker use..and one C= CHF   Adherence is always the initial "prime suspect" and is a multilayered concern that requires a "trust but verify" approach in every patient - starting with knowing how to use medications, especially inhalers, correctly, keeping up with refills and understanding the fundamental difference between maintenance and prns vs those medications only taken for a very short course and then stopped and not refilled.  - see hfa training above  ? Acid (or non-acid) GERD > always difficult to exclude as up to 75% of pts in some series report no assoc GI/ Heartburn symptoms> rec max (24h)  acid suppression and diet restrictions/ reviewed and instructions given in writing.   ? Allergy > send profile/ add singulair then regroup/ consider allergy eval   I had an extended discussion with the patient reviewing all relevant studies completed to date and  lasting 15 to 20 minutes of a 25 minute visit    Each maintenance medication was reviewed in detail including most importantly the difference  between maintenance and prns and under what circumstances the prns are to be triggered using an action plan format that is not reflected in the computer generated alphabetically organized AVS.    Please see instructions for details which were reviewed in writing and the patient given a copy highlighting the part that I personally wrote and discussed at today's ov.

## 2016-09-17 NOTE — Assessment & Plan Note (Signed)
-   02 dependent  since 07/02/10 >>  83% RA December 05, 2010       - ONO RA 08/05/12  :  Positive sat < 89 x 2:84m> repeat on 2lpm rec 08/12/2012  - 06/17/2013 reported desat with activity p Knee surgery > rec restart 2lpm with activity  - 06/27/2013   Walked 2lpm  x one lap @ 185 stopped due to sat 88% not sob , desat to 82% on RA just at the  Start of the walk  - 04/10/14  Sats  81% RA and then  Walked 3lpm x  2 laps @ 185 ft each stopped due to end of study, no desat   09/16/2016    rec 2lpm at rest/ 3lpm with activity /POC   Adequate control on present rx, reviewed > no change in rx needed

## 2016-09-18 DIAGNOSIS — M0589 Other rheumatoid arthritis with rheumatoid factor of multiple sites: Secondary | ICD-10-CM | POA: Diagnosis not present

## 2016-09-18 NOTE — Progress Notes (Signed)
Spoke with pt and notified of results per Dr. Wert. Pt verbalized understanding and denied any questions. 

## 2016-09-22 ENCOUNTER — Other Ambulatory Visit: Payer: Medicare Other

## 2016-09-24 ENCOUNTER — Encounter: Payer: Self-pay | Admitting: Cardiology

## 2016-09-29 ENCOUNTER — Other Ambulatory Visit: Payer: Medicare Other | Admitting: *Deleted

## 2016-09-29 DIAGNOSIS — I5032 Chronic diastolic (congestive) heart failure: Secondary | ICD-10-CM | POA: Diagnosis not present

## 2016-09-29 DIAGNOSIS — R0609 Other forms of dyspnea: Secondary | ICD-10-CM

## 2016-09-29 DIAGNOSIS — Z951 Presence of aortocoronary bypass graft: Secondary | ICD-10-CM | POA: Diagnosis not present

## 2016-09-29 DIAGNOSIS — E785 Hyperlipidemia, unspecified: Secondary | ICD-10-CM | POA: Diagnosis not present

## 2016-09-29 DIAGNOSIS — I10 Essential (primary) hypertension: Secondary | ICD-10-CM

## 2016-09-29 DIAGNOSIS — I5033 Acute on chronic diastolic (congestive) heart failure: Secondary | ICD-10-CM | POA: Diagnosis not present

## 2016-09-29 DIAGNOSIS — R0600 Dyspnea, unspecified: Secondary | ICD-10-CM

## 2016-09-29 LAB — COMPREHENSIVE METABOLIC PANEL
ALT: 15 U/L (ref 6–29)
AST: 17 U/L (ref 10–35)
Albumin: 4 g/dL (ref 3.6–5.1)
Alkaline Phosphatase: 57 U/L (ref 33–130)
BUN: 27 mg/dL — ABNORMAL HIGH (ref 7–25)
CO2: 34 mmol/L — ABNORMAL HIGH (ref 20–31)
Calcium: 9.9 mg/dL (ref 8.6–10.4)
Chloride: 99 mmol/L (ref 98–110)
Creat: 0.87 mg/dL (ref 0.50–0.99)
Glucose, Bld: 81 mg/dL (ref 65–99)
Potassium: 4.5 mmol/L (ref 3.5–5.3)
Sodium: 141 mmol/L (ref 135–146)
Total Bilirubin: 0.4 mg/dL (ref 0.2–1.2)
Total Protein: 7.2 g/dL (ref 6.1–8.1)

## 2016-09-29 LAB — LIPID PANEL
Cholesterol: 157 mg/dL (ref <200)
HDL: 76 mg/dL (ref >50)
LDL Cholesterol: 61 mg/dL (ref <100)
Total CHOL/HDL Ratio: 2.1 Ratio (ref <5.0)
Triglycerides: 101 mg/dL (ref <150)
VLDL: 20 mg/dL (ref <30)

## 2016-10-03 ENCOUNTER — Ambulatory Visit: Payer: Medicare Other | Admitting: Cardiology

## 2016-10-03 ENCOUNTER — Ambulatory Visit (INDEPENDENT_AMBULATORY_CARE_PROVIDER_SITE_OTHER): Payer: Medicare Other | Admitting: Cardiology

## 2016-10-03 VITALS — BP 124/82 | HR 83 | Ht 59.5 in | Wt 202.0 lb

## 2016-10-03 DIAGNOSIS — I10 Essential (primary) hypertension: Secondary | ICD-10-CM

## 2016-10-03 DIAGNOSIS — I2583 Coronary atherosclerosis due to lipid rich plaque: Secondary | ICD-10-CM

## 2016-10-03 DIAGNOSIS — R0600 Dyspnea, unspecified: Secondary | ICD-10-CM

## 2016-10-03 DIAGNOSIS — Z951 Presence of aortocoronary bypass graft: Secondary | ICD-10-CM | POA: Diagnosis not present

## 2016-10-03 DIAGNOSIS — E785 Hyperlipidemia, unspecified: Secondary | ICD-10-CM | POA: Diagnosis not present

## 2016-10-03 DIAGNOSIS — I5033 Acute on chronic diastolic (congestive) heart failure: Secondary | ICD-10-CM

## 2016-10-03 DIAGNOSIS — E782 Mixed hyperlipidemia: Secondary | ICD-10-CM

## 2016-10-03 DIAGNOSIS — I2510 Atherosclerotic heart disease of native coronary artery without angina pectoris: Secondary | ICD-10-CM

## 2016-10-03 DIAGNOSIS — R0609 Other forms of dyspnea: Secondary | ICD-10-CM

## 2016-10-03 NOTE — Patient Instructions (Signed)
Medication Instructions:   Your physician recommends that you continue on your current medications as directed. Please refer to the Current Medication list given to you today.     Follow-Up:  3 MONTHS WITH DR NELSON       If you need a refill on your cardiac medications before your next appointment, please call your pharmacy.   

## 2016-10-03 NOTE — Progress Notes (Signed)
Patient ID: [name], female   DOB: 05-26-1946, 70 y.o.   MRN: [id number]      Cardiology Office Note   Date:  10/03/2016   ID:  [name], DOB 01-Jan-1946, MRN [id number]  PCP:  Rogelia Boga, MD  Cardiologist: Tobias Alexander, MD   CC:    History of Present Illness: [name] is a 70 y.o. female who presents for post hospitalization visit after NSTEMI--> 3 VD --> emergent CABG in July 2016. She is mourning as her husband just passed away. She denies chest pain, SOB, no muscle pain, na palpitations, syncope, no LE edema, orthopnea, PND.  Patient comes after 2 months, at the last visit she complained that she has gained 15 pounds, today she again gained another 8 pounds. She has noticed to get short of breath with any activity but she attributes this to weight gain. She wakes up at night feeling short of breath. She has been having chronic cough for months on with white yellowish sputum. No fevers or chills. No lower extremity edema. No chest pain palpitations or syncope.   07/02/16 - she comes after 3 months, she continues to use home O2 at night and also when she has to do more than mild exertion. Stable SOB, her baseline for several years. She developed productive cough and received antibiotics and steroids from her PCP with some improvement. Denies LE edema, orthopnea or PND. No chest pain or palpitations. She is still very tearful when talking about Joe.   10/03/2016 - the patient is coming after 3 months, in the interim she started to use her home oxygen more frequently as she feel more short of breath, she follows with Dr. Sherene Sires for interstitial lung disease and pulmonary fibrosis, currently on 2 L of oxygen. She denies any chest pain, she is able to do all activities of daily living including shopping and cooking. No palpitations or syncope. She is tolerating atorvastatin well.   Past Medical History:  Diagnosis Date  . Arthritis    OA RIGHT KNEE WITH PAIN  .  Barrett esophagus   . Chronic respiratory failure (HCC)   . COPD (chronic obstructive pulmonary disease) (HCC)   . GERD (gastroesophageal reflux disease)   . History of ARDS 2006  . History of home oxygen therapy    AT NIGHT WHEN SLEEPING 2 L / MIN NASAL CANNULA  . Hypertension   . Hypothyroidism   . NSTEMI (non-ST elevated myocardial infarction) (HCC) 05/31/2015  . Pneumococcal pneumonia (HCC) 2006   HOSPITALIZED AND DEVELOPED ARDS  . Psoriatic arthritis (HCC)   . Pulmonary fibrosis (HCC)   . Rheumatic disease   . SOB (shortness of breath) on exertion     Past Surgical History:  Procedure Laterality Date  . ABDOMINOPLASTY    . CARDIAC CATHETERIZATION N/A 06/01/2015   Procedure: Left Heart Cath and Coronary Angiography;  Surgeon: Runell Gess, MD;  Location: [facility] INVASIVE CV LAB;  Service: Cardiovascular;  Laterality: N/A;  . CARPAL TUNNEL RELEASE    . CHOLECYSTECTOMY    . CORONARY ARTERY BYPASS GRAFT N/A 06/04/2015   Procedure: CORONARY ARTERY BYPASS GRAFT times three            with left internal mammary artery and right leg saphenous vein;  Surgeon: Alleen Borne, MD;  Location: [facility] OR;  Service: Open Heart Surgery;  Laterality: N/A;  . cosmetic breast surgery    . JOINT REPLACEMENT    . KNEE ARTHROSCOPY Left   .  TEE WITHOUT CARDIOVERSION  06/04/2015   Procedure: TRANSESOPHAGEAL ECHOCARDIOGRAM (TEE);  Surgeon: Alleen Borne, MD;  Location[facility];  Service: Open Heart Surgery;;  . TOTAL KNEE ARTHROPLASTY Left   . TOTAL KNEE ARTHROPLASTY Right 06/06/2013   Procedure: RIGHT TOTAL KNEE ARTHROPLASTY;  Surgeon: Loanne Drilling, MD;  Location: [facility] ORS;  Service: Orthopedics;  Laterality: Right;   Current Outpatient Prescriptions  Medication Sig Dispense Refill  . acetaminophen (TYLENOL ARTHRITIS PAIN) 650 MG CR tablet Take 1,300 mg by mouth every 8 (eight) hours as needed for pain. Per bottle directions as needed    . albuterol (PROAIR HFA) 108 (90 Base) MCG/ACT inhaler 2 puffs every 4  hours as needed only  if your can't catch your breath 1 Inhaler 11  . aspirin EC 325 MG EC tablet Take 1 tablet (325 mg total) by mouth daily. 30 tablet 0  . atorvastatin (LIPITOR) 80 MG tablet Take 1 tablet (80 mg total) by mouth daily at 6 PM. 90 tablet 3  . budesonide-formoterol (SYMBICORT) 160-4.5 MCG/ACT inhaler Inhale 2 puffs into the lungs 2 (two) times daily. 3 Inhaler 3  . Certolizumab Pegol (CIMZIA Cache) Inject into the skin as directed. Cimzia, Injected by provider as directed    . Cholecalciferol (VITAMIN D3) 2000 UNITS TABS Take 2,000 Int'l Units by mouth daily.    . citalopram (CELEXA) 40 MG tablet TAKE 1 TABLET BY MOUTH  EVERY MORNING 90 tablet 1  . famotidine (PEPCID) 20 MG tablet One at bedtime    . furosemide (LASIX) 20 MG tablet Take 1 tablet (20 mg total) by mouth 2 (two) times daily. 180 tablet 3  . levothyroxine (SYNTHROID) 150 MCG tablet Take 1 tablet (150 mcg total) by mouth daily before breakfast. 90 tablet 3  . losartan (COZAAR) 50 MG tablet Take 1 tablet (50 mg total) by mouth daily. 90 tablet 3  . montelukast (SINGULAIR) 10 MG tablet Take 1 tablet (10 mg total) by mouth at bedtime. 90 tablet 3  . NON FORMULARY 2lpm with sleep and with exertion if needed    . pantoprazole (PROTONIX) 40 MG tablet Take 30-60 min before first meal of the day 90 tablet 0  . predniSONE (DELTASONE) 10 MG tablet Take  4 each am x 2 days,   2 each am x 2 days,  1 each am x 2 days and stop 14 tablet 0  . traMADol (ULTRAM) 50 MG tablet TAKE 1-2 TABLETS EVERY 6 HOURS AS NEEDED FOR PAIN 360 tablet 3  . triamcinolone cream (KENALOG) 0.1 % Apply 1 application topically 2 (two) times daily as needed (for psorisis).     No current facility-administered medications for this visit.    Allergies:   Patient has no known allergies.   Social History:  The patient  reports that she quit smoking about 11 years ago. Her smoking use included Cigarettes. She has a 87.75 pack-year smoking history. She has never  used smokeless tobacco. She reports that she does not drink alcohol or use drugs.   Family History:  The patient's family history includes Breast cancer in her mother; Coronary artery disease in her father; Rheum arthritis in her father.   ROS:  Please see the history of present illness.  All other systems are reviewed and negative.   PHYSICAL EXAM: VS:  BP 124/82   Pulse 83   Ht 4' 11.5" (1.511 m)   Wt 202 lb (91.6 kg)   SpO2 96% Comment: 87%-96% after 5 mins of  rest  BMI 40.12 kg/m  , BMI Body mass index is 40.12 kg/m. GEN: Well nourished, well developed, in no acute distress  HEENT: normal  Neck: no JVD, carotid bruits, or masses Cardiac: RRR; no 3/6 systolic murmur, also rub, no gallops , no LE edema  Respiratory:  Mild Crackles at the right base. GI: soft, nontender, nondistended, + BS MS: no deformity or atrophy  Skin: warm and dry, no rash Neuro:  Strength and sensation are intact Psych: euthymic mood, full affect  EKG:  SRLAE, iRBBB, anterior MI age undetermined  Recent Labs: 03/18/2016: TSH 2.06 03/26/2016: Brain Natriuretic Peptide 44.3 09/16/2016: Hemoglobin 12.0; Platelets 221.0 09/29/2016: ALT 15; BUN 27; Creat 0.87; Potassium 4.5; Sodium 141   Lipid Panel    Component Value Date/Time   CHOL 157 09/29/2016 1335   CHOL 117 07/12/2015 1508   TRIG 101 09/29/2016 1335   TRIG 75 07/12/2015 1508   HDL 76 09/29/2016 1335   HDL 51 07/12/2015 1508   CHOLHDL 2.1 09/29/2016 1335   VLDL 20 09/29/2016 1335   LDLCALC 61 09/29/2016 1335   LDLCALC 51 07/12/2015 1508   LDLDIRECT 131.1 08/10/2012 0829   Wt Readings from Last 3 Encounters:  10/03/16 202 lb (91.6 kg)  09/16/16 201 lb 6.4 oz (91.4 kg)  07/22/16 201 lb (91.2 kg)    ECHO: 06/01/2015 - Left ventricle: The cavity size was normal. Wall thickness was increased in a pattern of mild LVH. Systolic function was normal. The estimated ejection fraction was in the range of 55% to 60%. There is hypokinesis of  the basalinferolateral myocardium. Doppler parameters are consistent with abnormal left ventricular relaxation (grade 1 diastolic dysfunction). - Mitral valve: Calcified annulus. Mildly thickened leaflets .  Impressions: - Hypokinesis of the basal/mild inferior lateral wall with overall preserved LV function; grade 1 diastolic dysfunction; mild LVH; trace MR.  TELEMETRY:  SR  TTE: 06/01/15 Study Conclusions  - Left ventricle: The cavity size was normal. Wall thickness was increased in a pattern of mild LVH. Systolic function was normal. The estimated ejection fraction was in the range of 55% to 60%. There is hypokinesis of the basalinferolateral myocardium. Doppler parameters are consistent with abnormal left ventricular relaxation (grade 1 diastolic dysfunction). - Mitral valve: Calcified annulus. Mildly thickened leaflets .  Impressions:  - Hypokinesis of the basal/mild inferior lateral wall with overall preserved LV function; grade 1 diastolic dysfunction; mild LVH; trace MR.  TTE: 09/07/2015 - Left ventricle: The cavity size was normal. There was moderate concentric hypertrophy. Systolic function was normal. The estimated ejection fraction was in the range of 60% to 65%. Wall motion was normal; there were no regional wall motion abnormalities. Doppler parameters are consistent with abnormal left ventricular relaxation (grade 1 diastolic dysfunction). There was no evidence of elevated ventricular filling pressure by Doppler parameters. - Aortic valve: There was no regurgitation. - Aortic root: The aortic root was normal in size. - Mitral valve: Calcified annulus. Mildly thickened leaflets . - Left atrium: The atrium was mildly dilated. - Right ventricle: The cavity size was normal. Wall thickness was normal. Systolic function was normal. - Right atrium: The atrium was normal in size. - Tricuspid valve: There was trivial  regurgitation. - Pulmonic valve: There was no regurgitation. - Pulmonary arteries: Systolic pressure was within the normal range. - Inferior vena cava: The vessel was normal in size. - Pericardium, extracardiac: There was no pericardial effusion.  Impressions:  - There is mild basal, mid inferior wall hypokinesis, overall LVEF  normal.  EKG performed today 02/19/2016 showed normal sinus rhythm rightward axis incomplete right bundle branch block and prior anterior MI.   ASSESSMENT AND PLAN:  1. CAD, s/p NSTEMI (non-ST elevated myocardial infarction), 3-VD with preserved LV function. Repeat echo showed LVEF 60-65%. On aspirin and atorvastatin, losartan, metoprolol discontinued as she was bradycardic. She is asymptomatic from cardiac standpoint, all of her labs including electrolytes LFTs and lipids were at goal on November 27.  2. 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 She is advised she can go up to 3 L when her O2 sats fall below 90%, she will follow with Dr. Sharee Pimple later this months.  3. Acute on chronic diastolic dysfunction - on Lasix 20 mg by mouth twice a day  - She is euvolemic, normal creatinine, will continue the same regimen.  4. HTN - controlled, continue same regimen..   5. Hyperlipidemia - on atorvastatin, LFTs normal. Lipids all at goal.  6. Hypothyroidism - TSH 12 in 11/16, 8 in 1/17, increased Synthroid to 150 mcg daily. We will recheck.  7. Chronic bronchitis, uses Symbicort infrequently advised to use it twice a day, she uses oxygen at night.  Follow up in 3 months  Signed, Tobias Alexander, MD  10/03/2016 8:50 AM    [facility] [address] [place], [place], [place]  [address] Phone: [phone]; Fax: [phone]

## 2016-10-07 ENCOUNTER — Other Ambulatory Visit: Payer: Self-pay | Admitting: Internal Medicine

## 2016-10-07 DIAGNOSIS — J45991 Cough variant asthma: Secondary | ICD-10-CM

## 2016-10-21 ENCOUNTER — Ambulatory Visit: Payer: Medicare Other | Admitting: Cardiology

## 2016-10-29 ENCOUNTER — Ambulatory Visit (INDEPENDENT_AMBULATORY_CARE_PROVIDER_SITE_OTHER): Payer: Medicare Other | Admitting: Internal Medicine

## 2016-10-29 ENCOUNTER — Encounter: Payer: Self-pay | Admitting: Internal Medicine

## 2016-10-29 VITALS — BP 160/72 | HR 94 | Ht 58.5 in | Wt 209.0 lb

## 2016-10-29 DIAGNOSIS — J45991 Cough variant asthma: Secondary | ICD-10-CM | POA: Diagnosis not present

## 2016-10-29 DIAGNOSIS — J9612 Chronic respiratory failure with hypercapnia: Secondary | ICD-10-CM | POA: Diagnosis not present

## 2016-10-29 DIAGNOSIS — I2583 Coronary atherosclerosis due to lipid rich plaque: Secondary | ICD-10-CM | POA: Diagnosis not present

## 2016-10-29 DIAGNOSIS — I2510 Atherosclerotic heart disease of native coronary artery without angina pectoris: Secondary | ICD-10-CM

## 2016-10-29 DIAGNOSIS — J9611 Chronic respiratory failure with hypoxia: Secondary | ICD-10-CM

## 2016-10-29 NOTE — Assessment & Plan Note (Signed)
-  PFT's  06/04/15  FEV1 1.20 (67 % ) ratio 83  p 6 % improvement from saba with DLCO  80 % corrects to 132 % for alv volume      - Clinical dx based on response to symbicort   - 09/16/2016  After extensive coaching HFA effectiveness = 90%     FENO 09/16/2016  =   96 on symbicort 160 2bid > added singulair  Allergy profile 09/16/2016 >  Eos 0.5 /  IgE  78 neg RAST   All goals of chronic asthma control met including optimal function and elimination of symptoms with minimal need for rescue therapy.  Contingencies discussed in full including contacting this office immediately if not controlling the symptoms using the rule of two's.     Each maintenance medication was reviewed in detail including most importantly the difference between maintenance and as needed and under what circumstances the prns are to be used.  Please see AVS for  customized  Instructions which were reviewed in detail in writing with the patient and a copy provided.    Ok to f/u q 6 m

## 2016-10-29 NOTE — Patient Instructions (Addendum)
No change in medications - be sure to keep up with refills \  Please schedule a follow up visit in 6 months but call sooner if needed

## 2016-10-29 NOTE — Assessment & Plan Note (Addendum)
-   02 dependent  since 07/02/10 >>  83% RA December 05, 2010       - ONO RA 08/05/12  :  Positive sat < 89 x 2:36m> repeat on 2lpm rec 08/12/2012  - 06/17/2013 reported desat with activity p Knee surgery > rec restart 2lpm with activity  - 06/27/2013   Walked 2lpm  x one lap @ 185 stopped due to sat 88% not sob , desat to 82% on RA just at the  Start of the walk  - 04/10/14  Sats  81% RA and then  Walked 3lpm x  2 laps @ 185 ft each stopped due to end of study, no desat  - HCO3  09/29/16  = 34     10/29/2016    rec 2lpm at rest/ 3lpm with activity /POC

## 2016-10-29 NOTE — Progress Notes (Signed)
Subjective:   Patient ID: [name], female    DOB: Apr 29, 1946    MRN: [id number]     Brief patient profile:  81 yowf quit smoking in May 2006 with dx pna/ ards resumed full activity including yardwork at wt 140 and pft's c/w restrictive changes 04/2010 at wt 170 but reported improvement on saba so maintained chronically on symbicort 160 despite no sign airflow obst on pfts which was confirmed  06/04/15    History of Present Illness  July 18, 2010  1st pulmonary office eval in ER era c/o doe x 6 months progressive indolent onset with copd vs pf.  Ultimately required hosp 8/30-9/1 dx copd/pf ? cause on sulfasalzine and ACE inhib better on 02 and advair but very hoarse with sense of chest congestion and cough with white mucus day > night.  doe x > slow adls.stop advair Start symbicort 160 2 puffs first thing  in am and 2 puffs again in pm about 12 hours later  Work on inhaler technique:   stop benazapril start benicar 40/25  one daily Use 02 sleeping and any activity other than sitting still   04/10/2014 f/u ov/Wert re: restrictive lung dz/ RA/psoriatric/ symbicort 160 2bid  Chief Complaint  Patient presents with  . Follow-up    Pt states was advised to f/u per [employer].  Pt has been having increased SOB- relates to stress from dealing with her spouse's illness. She is using o2 pretty much 24/7.   trying to do more than baseline activity and finds she needs 02 more. rec Work on inhaler technique:   Ok to just use the symbicort 160 2 puffs in am to see what difference if any this makes in your breathing  Change 02 to 2lpm 24/7 and use 3lpm with activity    06/04/15  cabg     07/22/2016  f/u ov/Wert re: PF sp ali/ AB/ symbicort 160 2bid on 02 2lpm at rest/ 3lpm with activity  Chief Complaint  Patient presents with  . Follow-up    Breathing is "not as good as it could be".  Breathing has been worse for the past 2 months. She gets winded just making the bed. She is also c/o prod cough  with yellow sputum and occ wheezing.   sob gradually worse x 2 m already tried on abx/pred per PC no better, using omeprazole 20mg  9am/ does not have a rescue inhaler at all  rec Prednisone 10 mg take  4 each am x 2 days,   2 each am x 2 days,  1 each am x 2 days and stop  Plan A = Automatic = Symbicort 160 Take 2 puffs first thing in am and then another 2 puffs about 12 hours later.  Work on inhaler technique:  Plan B = Backup Only use your albuterol(proair)  as a rescue medication Pantoprazole (protonix) 40 mg (same as omeprzole 20 mg x2)   Take  30-60 min before first meal of the day and Pepcid (famotidine)  20 mg one @  bedtime until return to office - this is the best way to tell whether stomach acid is contributing to your problem.   GERD (REFLUX)  is an extremely common  Please see patient coordinator before you leave today  to schedule portable 02 evaluation Please remember to go to the  x-ray department downstairs for your tests - we will call you with the results when they are available.   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 Follow-up    Breathing has improved some. She is coughing less, but still coughing up beige sputum.  She has not used albuterol inhaler.   cough assoc with variable nasal stuffiness / much better with pred  Worse with laughing / not typically noct but some at hs  pred helps the cough but not the doe  rec Plan A = Automatic = symicort 160 Take 2 puffs first thing in am and then another 2 puffs about 12 hours later.                                      Add singulair 10 mg one each pm Plan B = Backup Only use your albuterol as a rescue medication   10/29/2016  f/u ov/Wert re:  Cough variant asthma on symb 160 2bid and singulair  02 2lpm hs and prn daytime  Chief Complaint  Patient presents with  . Follow-up    Breathing is  overall doing well. She has occ wheezing. She rarely uses albuterol inhaler.   rarely  Needs  albuerol   Using 02 with activity 2lpm and sats ok up to 3lpm rarely needed now [facility] = can't walk a nl pace on a flat grade s sob but does fine slow and flat eg slow shopping always on 02 and planning mall walking      No obvious daytime variabilty or assoc excess/ purulent sputum or mucus plugs  Cp,  chest tightness,  overt sinus or hb symptoms. No unusual exp hx or h/o childhood pna/ asthma or knowledge of premature birth.   Sleeping ok on 02 2lpm without nocturnal  or early am exacerbation  of respiratory  c/o's or need for noct saba. Also denies any obvious fluctuation of symptoms with weather or environmental changes or other aggravating or alleviating factors except as outlined above   Current Medications, Allergies,   Past Surgical History, Family History, and Social History were reviewed in [employer] record.  ROS  The following are not active complaints unless bolded sore throat, dysphagia, dental problems, itching, sneezing,  nasal congestion or excess/ purulent secretions, ear ache,   fever, chills, sweats, unintended wt loss, pleuritic or exertional cp, hemoptysis,  orthopnea pnd or leg swelling, presyncope, palpitations, heartburn, abdominal pain, anorexia, nausea, vomiting, diarrhea  or change in bowel or urinary habits, change in stools or urine, dysuria,hematuria,  rash, arthralgias baseline , visual complaints, headache, numbness weakness or ataxia or problems with walking or coordination,  change in mood/affect or memory.        Past Medical History: Psoriatic and rheumatoid  arthritis.............................Marland KitchenZimenski Hypertension      - Try off ACE July 18, 2010 >>  much better  Hypothyroidism Barrett's esophagus history of pneumococcal pneumonia and ARDS 2006 COPD Chronic Respiratory Failure      - 02 dependent  since 07/02/10 >>  83% RA  December 05, 2010  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  Pna       - CT chest 07/03/10 Nonspecific PF mostly upper lobes       - CT chest 12/03/10 acute gg changes and effusions c/w chf        -  PFT's  04/12/10 FEV1  1.21 (69%) ratio 77 and no change p B2,  DLC0 56% Asthmatic bronchitis     - HFA 50% p coaching July 18, 2010 >>  90%  02/25/2011  history of mild vitamin D deficiency        Objective:   Physical Exam   amb  mod obese wf  nad    Vital signs reviewed  -  Note sats 94% RA on arrival      wt 192 July 18, 2010 > 197 August 21, 2010 > 207 December 05, 2010  > 183  01/21/2011  > 179 02/25/2011 >189 08/10/2012 >195 09/08/12 >200 12/27/12 > 202 05/03/2013 > 06/28/2013  199 > 08/08/13 194 > 04/10/2014  203 > 04/19/2015   176 >  07/23/2015 166 > 09/16/2016  210 >  10/29/2016  209   HEENT mild turbinate edema.  Top denture  Oropharynx no thrush or excess pnd or cobblestoning.  No JVD or cervical adenopathy. Mild accessory muscle hypertrophy. Trachea midline, nl thryroid.  Clear to A and P  . Regular rate and rhythm without murmur gallop or rub or increase P2 or edema.  Abd: no hsm, nl excursion. Ext warm without cyanosis or clubbing. Skin with classic psoriatic changes both hands       CXR PA and Lateral:   07/22/2016 :    I personally reviewed images and agree with radiology impression as follows:    1. No acute cardiopulmonary disease. 2. Stable changes from CABG surgery. 3. Findings of chronic interstitial lung disease.

## 2016-10-29 NOTE — Assessment & Plan Note (Signed)
pfts with erv 14% 06/04/15   Body mass index is 42.94   - no change   Lab Results  Component Value Date   TSH 2.06 03/18/2016     Contributing to gerd tendency/ doe/reviewed the need and the process to achieve and maintain neg calorie balance > defer f/u primary care including intermittently monitoring thyroid status

## 2016-10-31 DIAGNOSIS — Z6841 Body Mass Index (BMI) 40.0 and over, adult: Secondary | ICD-10-CM | POA: Diagnosis not present

## 2016-10-31 DIAGNOSIS — M0589 Other rheumatoid arthritis with rheumatoid factor of multiple sites: Secondary | ICD-10-CM | POA: Diagnosis not present

## 2016-10-31 DIAGNOSIS — M150 Primary generalized (osteo)arthritis: Secondary | ICD-10-CM | POA: Diagnosis not present

## 2016-10-31 DIAGNOSIS — Z79899 Other long term (current) drug therapy: Secondary | ICD-10-CM | POA: Diagnosis not present

## 2016-10-31 DIAGNOSIS — L409 Psoriasis, unspecified: Secondary | ICD-10-CM | POA: Diagnosis not present

## 2016-10-31 DIAGNOSIS — I252 Old myocardial infarction: Secondary | ICD-10-CM | POA: Diagnosis not present

## 2016-11-13 DIAGNOSIS — M0589 Other rheumatoid arthritis with rheumatoid factor of multiple sites: Secondary | ICD-10-CM | POA: Diagnosis not present

## 2016-11-24 DIAGNOSIS — Z23 Encounter for immunization: Secondary | ICD-10-CM | POA: Diagnosis not present

## 2016-12-11 DIAGNOSIS — M0589 Other rheumatoid arthritis with rheumatoid factor of multiple sites: Secondary | ICD-10-CM | POA: Diagnosis not present

## 2016-12-25 ENCOUNTER — Encounter: Payer: Self-pay | Admitting: Cardiology

## 2017-01-01 ENCOUNTER — Telehealth: Payer: Self-pay | Admitting: Internal Medicine

## 2017-01-01 NOTE — Telephone Encounter (Signed)
Patient is returning phone call.  °

## 2017-01-01 NOTE — Telephone Encounter (Signed)
Per MW- stem cell therapy would not be a benefit  I spoke with the pt and notified her of this and she verbalized understanding

## 2017-01-01 NOTE — Telephone Encounter (Signed)
lmomtcb x1 

## 2017-01-05 ENCOUNTER — Ambulatory Visit (INDEPENDENT_AMBULATORY_CARE_PROVIDER_SITE_OTHER): Payer: Medicare Other | Admitting: Cardiology

## 2017-01-05 VITALS — BP 124/70 | HR 78 | Ht 58.5 in | Wt 207.0 lb

## 2017-01-05 DIAGNOSIS — I5033 Acute on chronic diastolic (congestive) heart failure: Secondary | ICD-10-CM | POA: Diagnosis not present

## 2017-01-05 DIAGNOSIS — R946 Abnormal results of thyroid function studies: Secondary | ICD-10-CM

## 2017-01-05 DIAGNOSIS — I10 Essential (primary) hypertension: Secondary | ICD-10-CM

## 2017-01-05 DIAGNOSIS — Z951 Presence of aortocoronary bypass graft: Secondary | ICD-10-CM | POA: Diagnosis not present

## 2017-01-05 DIAGNOSIS — E785 Hyperlipidemia, unspecified: Secondary | ICD-10-CM

## 2017-01-05 DIAGNOSIS — R0609 Other forms of dyspnea: Secondary | ICD-10-CM | POA: Diagnosis not present

## 2017-01-05 DIAGNOSIS — R7989 Other specified abnormal findings of blood chemistry: Secondary | ICD-10-CM

## 2017-01-05 DIAGNOSIS — R0600 Dyspnea, unspecified: Secondary | ICD-10-CM

## 2017-01-05 MED ORDER — BUDESONIDE-FORMOTEROL FUMARATE 160-4.5 MCG/ACT IN AERO: 2.0000 | INHALATION_SPRAY | Freq: Two times a day (BID) | RESPIRATORY_TRACT | 6 refills | Status: DC

## 2017-01-05 NOTE — Progress Notes (Signed)
Patient ID: [name], female   DOB: 19-Feb-1946, 71 y.o.   MRN: [id number]      Cardiology Office Note  Date:  01/05/2017   ID:  [name], DOB [birth year], MRN [id number]  PCP:  Rogelia Boga, MD  Cardiologist: Tobias Alexander, MD   CC:    History of Present Illness: [name] is a 71 y.o. female who presents for post hospitalization visit after NSTEMI--> 3 VD --> emergent CABG in July 2016. She is mourning as her husband just passed away. She denies chest pain, SOB, no muscle pain, na palpitations, syncope, no LE edema, orthopnea, PND.  Patient comes after 2 months, at the last visit she complained that she has gained 15 pounds, today she again gained another 8 pounds. She has noticed to get short of breath with any activity but she attributes this to weight gain. She wakes up at night feeling short of breath. She has been having chronic cough for months on with white yellowish sputum. No fevers or chills. No lower extremity edema. No chest pain palpitations or syncope.   07/02/16 - she comes after 3 months, she continues to use home O2 at night and also when she has to do more than mild exertion. Stable SOB, her baseline for several years. She developed productive cough and received antibiotics and steroids from her PCP with some improvement. Denies LE edema, orthopnea or PND. No chest pain or palpitations. She is still very tearful when talking about Joe.   10/03/2016 - the patient is coming after 3 months, in the interim she started to use her home oxygen more frequently as she feel more short of breath, she follows with Dr. Sherene Sires for interstitial lung disease and pulmonary fibrosis, currently on 2 L of oxygen. She denies any chest pain, she is able to do all activities of daily living including shopping and cooking. No palpitations or syncope. She is tolerating atorvastatin well.  01/05/2017 - this is 3 months follow-up, patient is overall doing well with stable shortness  of breath on 2 L of oxygen 24/7, the only change right now is that she feels she needs to use oxygen all the time. She denies any chest pain or shortness of breath. She manages lower extremity edema with 20 mg of Lasix in the morning and as needed in the afternoon but she usually doesn't need to take it. No palpitations or syncope.    Past Medical History:  Diagnosis Date  . Arthritis    OA RIGHT KNEE WITH PAIN  . Barrett esophagus   . Chronic respiratory failure (HCC)   . COPD (chronic obstructive pulmonary disease) (HCC)   . GERD (gastroesophageal reflux disease)   . History of ARDS 2006  . History of home oxygen therapy    AT NIGHT WHEN SLEEPING 2 L / MIN NASAL CANNULA  . Hypertension   . Hypothyroidism   . NSTEMI (non-ST elevated myocardial infarction) (HCC) 05/31/2015  . Pneumococcal pneumonia (HCC) 2006   HOSPITALIZED AND DEVELOPED ARDS  . Psoriatic arthritis (HCC)   . Pulmonary fibrosis (HCC)   . Rheumatic disease   . SOB (shortness of breath) on exertion     Past Surgical History:  Procedure Laterality Date  . ABDOMINOPLASTY    . CARDIAC CATHETERIZATION N/A 06/01/2015   Procedure: Left Heart Cath and Coronary Angiography;  Surgeon: Runell Gess, MD;  Location: [facility] INVASIVE CV LAB;  Service: Cardiovascular;  Laterality: N/A;  . CARPAL TUNNEL RELEASE    .  CHOLECYSTECTOMY    . CORONARY ARTERY BYPASS GRAFT N/A 06/04/2015   Procedure: CORONARY ARTERY BYPASS GRAFT times three            with left internal mammary artery and right leg saphenous vein;  Surgeon: Alleen Borne, MD;  Location: [facility] OR;  Service: Open Heart Surgery;  Laterality: N/A;  . cosmetic breast surgery    . JOINT REPLACEMENT    . KNEE ARTHROSCOPY Left   . TEE WITHOUT CARDIOVERSION  06/04/2015   Procedure: TRANSESOPHAGEAL ECHOCARDIOGRAM (TEE);  Surgeon: Alleen Borne, MD;  Location: [facility] OR;  Service: Open Heart Surgery;;  . TOTAL KNEE ARTHROPLASTY Left   . TOTAL KNEE ARTHROPLASTY Right 06/06/2013   Procedure:  RIGHT TOTAL KNEE ARTHROPLASTY;  Surgeon: Loanne Drilling, MD;  Location: [facility] ORS;  Service: Orthopedics;  Laterality: Right;   Current Outpatient Prescriptions  Medication Sig Dispense Refill  . acetaminophen (TYLENOL ARTHRITIS PAIN) 650 MG CR tablet Take 1,300 mg by mouth every 8 (eight) hours as needed for pain. Per bottle directions as needed    . albuterol (PROAIR HFA) 108 (90 Base) MCG/ACT inhaler 2 puffs every 4 hours as needed only  if your can't catch your breath 1 Inhaler 11  . aspirin EC 325 MG EC tablet Take 1 tablet (325 mg total) by mouth daily. 30 tablet 0  . atorvastatin (LIPITOR) 80 MG tablet Take 1 tablet (80 mg total) by mouth daily at 6 PM. 90 tablet 3  . budesonide-formoterol (SYMBICORT) 160-4.5 MCG/ACT inhaler Inhale 2 puffs into the lungs 2 (two) times daily. 3 Inhaler 3  . Certolizumab Pegol (CIMZIA [place]) Inject into the skin as directed. Cimzia, Injected by provider as directed    . Cholecalciferol (VITAMIN D3) 2000 UNITS TABS Take 2,000 Int'l Units by mouth daily.    . citalopram (CELEXA) 40 MG tablet TAKE 1 TABLET BY MOUTH  EVERY MORNING 90 tablet 1  . famotidine (PEPCID) 20 MG tablet One at bedtime    . furosemide (LASIX) 20 MG tablet Take 1 tablet (20 mg total) by mouth 2 (two) times daily. 180 tablet 3  . levothyroxine (SYNTHROID) 150 MCG tablet Take 1 tablet (150 mcg total) by mouth daily before breakfast. 90 tablet 3  . losartan (COZAAR) 50 MG tablet Take 1 tablet (50 mg total) by mouth daily. 90 tablet 3  . montelukast (SINGULAIR) 10 MG tablet Take 1 tablet (10 mg total) by mouth at bedtime. 90 tablet 3  . NON FORMULARY 2lpm with sleep and with exertion if needed    . pantoprazole (PROTONIX) 40 MG tablet TAKE 1 TABLET BY MOUTH  DAILY 30 TO 60 MINUTES  BEFORE FIRST MEAL OF THE  DAY 90 tablet 0  . traMADol (ULTRAM) 50 MG tablet TAKE 1-2 TABLETS EVERY 6 HOURS AS NEEDED FOR PAIN 360 tablet 3  . triamcinolone cream (KENALOG) 0.1 % Apply 1 application topically 2 (two)  times daily as needed (for psorisis).     No current facility-administered medications for this visit.    Allergies:   Patient has no known allergies.   Social History:  The patient  reports that she quit smoking about 11 years ago. Her smoking use included Cigarettes. She has a 87.75 pack-year smoking history. She has never used smokeless tobacco. She reports that she does not drink alcohol or use drugs.   Family History:  The patient's family history includes Breast cancer in her mother; Coronary artery disease in her father; Rheum arthritis in her father.  ROS:  Please see the history of present illness.  All other systems are reviewed and negative.   PHYSICAL EXAM: VS:  BP 124/70   Pulse 78   Ht 4' 10.5" (1.486 m)   Wt 207 lb (93.9 kg)   BMI 42.53 kg/m  , BMI Body mass index is 42.53 kg/m. GEN: Well nourished, well developed, in no acute distress  HEENT: normal  Neck: no JVD, carotid bruits, or masses Cardiac: RRR; no 3/6 systolic murmur, also rub, no gallops , no LE edema  Respiratory:  B/L wheezes. GI: soft, nontender, nondistended, + BS MS: no deformity or atrophy  Skin: warm and dry, no rash Neuro:  Strength and sensation are intact Psych: euthymic mood, full affect  EKG:  SRLAE, iRBBB, anterior MI age undetermined  Recent Labs: 03/18/2016: TSH 2.06 03/26/2016: Brain Natriuretic Peptide 44.3 09/16/2016: Hemoglobin 12.0; Platelets 221.0 09/29/2016: ALT 15; BUN 27; Creat 0.87; Potassium 4.5; Sodium 141   Lipid Panel    Component Value Date/Time   CHOL 157 09/29/2016 1335   CHOL 117 07/12/2015 1508   TRIG 101 09/29/2016 1335   TRIG 75 07/12/2015 1508   HDL 76 09/29/2016 1335   HDL 51 07/12/2015 1508   CHOLHDL 2.1 09/29/2016 1335   VLDL 20 09/29/2016 1335   LDLCALC 61 09/29/2016 1335   LDLCALC 51 07/12/2015 1508   LDLDIRECT 131.1 08/10/2012 0829   Wt Readings from Last 3 Encounters:  01/05/17 207 lb (93.9 kg)  10/29/16 209 lb (94.8 kg)  10/03/16 202 lb  (91.6 kg)    ECHO: 06/01/2015 - Left ventricle: The cavity size was normal. Wall thickness was increased in a pattern of mild LVH. Systolic function was normal. The estimated ejection fraction was in the range of 55% to 60%. There is hypokinesis of the basalinferolateral myocardium. Doppler parameters are consistent with abnormal left ventricular relaxation (grade 1 diastolic dysfunction). - Mitral valve: Calcified annulus. Mildly thickened leaflets .  Impressions: - Hypokinesis of the basal/mild inferior lateral wall with overall preserved LV function; grade 1 diastolic dysfunction; mild LVH; trace MR.  TELEMETRY:  SR  TTE: 06/01/15 Study Conclusions  - Left ventricle: The cavity size was normal. Wall thickness was increased in a pattern of mild LVH. Systolic function was normal. The estimated ejection fraction was in the range of 55% to 60%. There is hypokinesis of the basalinferolateral myocardium. Doppler parameters are consistent with abnormal left ventricular relaxation (grade 1 diastolic dysfunction). - Mitral valve: Calcified annulus. Mildly thickened leaflets .  Impressions:  - Hypokinesis of the basal/mild inferior lateral wall with overall preserved LV function; grade 1 diastolic dysfunction; mild LVH; trace MR.  TTE: 09/07/2015 - Left ventricle: The cavity size was normal. There was moderate concentric hypertrophy. Systolic function was normal. The estimated ejection fraction was in the range of 60% to 65%. Wall motion was normal; there were no regional wall motion abnormalities. Doppler parameters are consistent with abnormal left ventricular relaxation (grade 1 diastolic dysfunction). There was no evidence of elevated ventricular filling pressure by Doppler parameters. - Aortic valve: There was no regurgitation. - Aortic root: The aortic root was normal in size. - Mitral valve: Calcified annulus. Mildly thickened  leaflets . - Left atrium: The atrium was mildly dilated. - Right ventricle: The cavity size was normal. Wall thickness was normal. Systolic function was normal. - Right atrium: The atrium was normal in size. - Tricuspid valve: There was trivial regurgitation. - Pulmonic valve: There was no regurgitation. - Pulmonary arteries: Systolic pressure  was within the normal range. - Inferior vena cava: The vessel was normal in size. - Pericardium, extracardiac: There was no pericardial effusion.  Impressions: - There is mild basal, mid inferior wall hypokinesis, overall LVEF normal.  EKG - performed on 01/05/2017 was personally reviewed and shows normal sinus rhythm with incomplete right bundle branch block and is unchanged from prior.    ASSESSMENT AND PLAN:  1. CAD, s/p NSTEMI (non-ST elevated myocardial infarction), 3-VD with preserved LV function. Repeat echo showed LVEF 60-65%. On aspirin and atorvastatin, losartan, metoprolol discontinued as she was bradycardic. She is asymptomatic, ECG today unchanged from prior. Tolerating medications. No ischemic work up needed.  2. 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 She is wheezing today, we will refill her Symbicort.  3. Acute on chronic diastolic dysfunction - on Lasix 20 mg by mouth daily, afternoon one PRN. She is euvolemic, normal creatinine, will continue the same regimen.  4. HTN - controlled, continue same regimen..   5. Hyperlipidemia - on atorvastatin, LFTs normal Lipids all at goal in 09/2016.  6. Hypothyroidism - TSH 12 in 11/16, 8 in 1/17, increased Synthroid to 150 mcg daily. TSH improved to 2.0 in 09/2016.  Follow up in 6 months  Signed, Tobias Alexander, MD  01/05/2017 1:57 PM    [facility] [address] [place], [place], [place]  [address] Phone: [phone]; Fax: [phone]

## 2017-01-05 NOTE — Patient Instructions (Signed)

## 2017-01-08 DIAGNOSIS — M0589 Other rheumatoid arthritis with rheumatoid factor of multiple sites: Secondary | ICD-10-CM | POA: Diagnosis not present

## 2017-01-19 ENCOUNTER — Other Ambulatory Visit: Payer: Self-pay | Admitting: Cardiology

## 2017-01-19 DIAGNOSIS — J209 Acute bronchitis, unspecified: Secondary | ICD-10-CM

## 2017-01-19 DIAGNOSIS — I5033 Acute on chronic diastolic (congestive) heart failure: Secondary | ICD-10-CM

## 2017-01-19 DIAGNOSIS — Z951 Presence of aortocoronary bypass graft: Secondary | ICD-10-CM

## 2017-01-19 DIAGNOSIS — I10 Essential (primary) hypertension: Secondary | ICD-10-CM

## 2017-01-19 DIAGNOSIS — D62 Acute posthemorrhagic anemia: Secondary | ICD-10-CM

## 2017-01-20 ENCOUNTER — Other Ambulatory Visit: Payer: Self-pay | Admitting: Internal Medicine

## 2017-01-29 ENCOUNTER — Other Ambulatory Visit: Payer: Self-pay | Admitting: Cardiology

## 2017-01-29 NOTE — Telephone Encounter (Signed)
Okay to refill? Please advise. Thanks, MI 

## 2017-01-29 NOTE — Telephone Encounter (Signed)
Yes please refill 

## 2017-02-10 ENCOUNTER — Encounter: Payer: Self-pay | Admitting: Adult Health

## 2017-02-10 ENCOUNTER — Telehealth: Payer: Self-pay | Admitting: Internal Medicine

## 2017-02-10 ENCOUNTER — Ambulatory Visit (INDEPENDENT_AMBULATORY_CARE_PROVIDER_SITE_OTHER)
Admission: RE | Admit: 2017-02-10 | Discharge: 2017-02-10 | Disposition: A | Discharge status: Home / Self Care | Payer: Medicare Other | Source: Ambulatory Visit | Attending: Adult Health | Admitting: Adult Health

## 2017-02-10 ENCOUNTER — Ambulatory Visit (INDEPENDENT_AMBULATORY_CARE_PROVIDER_SITE_OTHER): Payer: Medicare Other | Admitting: Adult Health

## 2017-02-10 DIAGNOSIS — R059 Cough, unspecified: Secondary | ICD-10-CM

## 2017-02-10 DIAGNOSIS — R05 Cough: Secondary | ICD-10-CM

## 2017-02-10 DIAGNOSIS — J9612 Chronic respiratory failure with hypercapnia: Secondary | ICD-10-CM | POA: Diagnosis not present

## 2017-02-10 DIAGNOSIS — J209 Acute bronchitis, unspecified: Secondary | ICD-10-CM | POA: Diagnosis not present

## 2017-02-10 DIAGNOSIS — J9611 Chronic respiratory failure with hypoxia: Secondary | ICD-10-CM

## 2017-02-10 DIAGNOSIS — J45991 Cough variant asthma: Secondary | ICD-10-CM | POA: Diagnosis not present

## 2017-02-10 DIAGNOSIS — R0602 Shortness of breath: Secondary | ICD-10-CM

## 2017-02-10 MED ORDER — AMOXICILLIN-POT CLAVULANATE 875-125 MG PO TABS: 1.0000 | ORAL_TABLET | Freq: Two times a day (BID) | ORAL | 0 refills | Status: AC

## 2017-02-10 MED ORDER — PREDNISONE 10 MG PO TABS: ORAL_TABLET | ORAL | 0 refills | Status: DC

## 2017-02-10 NOTE — Assessment & Plan Note (Addendum)
Flare  CXR w/ no sign of PNA - reviewed personally   Plan  Patient Instructions  Augmentin 875mg  Twice daily  For 7 days , take w/ food .  Mucinex DM Twice daily  As needed  Cough/congestion  Prednisone taper over next week.  Continue on Symbicort .  Continue on Oxygen 2l/m .  Follow up with Dr.  In 2 months and As needed   Please contact office for sooner follow up if symptoms do not improve or worsen or seek emergency care

## 2017-02-10 NOTE — Assessment & Plan Note (Signed)
Cont on O2 .  

## 2017-02-10 NOTE — Assessment & Plan Note (Signed)
Cont on Symbicort  

## 2017-02-10 NOTE — Progress Notes (Signed)
@Patient  ID: [name], female    DOB: 12/30/1945, 71 y.o.   MRN: [id number]  Chief Complaint  Patient presents with  . Acute Visit    asthma     Referring provider: Gordy Savers, MD  HPI: 71 year old female former smoker followed for cough variant asthma, pulmonary fibrosis   TEST  -s/p ARDS 2006 with bacteremic S  Pna       - CT chest 07/03/10 Nonspecific PF mostly upper lobes       - CT chest 12/03/10 acute gg changes and effusions c/w chf        - PFT's  04/12/10 FEV1  1.21 (69%) ratio 77 and no change p B2,  DLC0 56%   VC 70%         - PFTs  08/08/2013 FEV1 1.21 (60%) ratio 86 and no change p B2 DLCO 79%  VC 72%  On symbicort 160 2bid   02/10/2017 Acute OV : Asthma  Patient presents for an acute office visit. Patient complains of 1 week of sore throat , drainage , increased cough and congestion with thick yellow mucus , low grade fever and wheezing .  No body aches , n/v/d, hemoptysis , leg swelling .  Eating well.  Chest x-ray today shows stable chronic fibrotic changes with no acute process noted.  She has Asthma on Symbicort Twice daily  .   On O2 at 2l/m .    No Known Allergies  Immunization History  Administered Date(s) Administered  . Influenza Split 08/04/2011, 09/01/2012, 07/05/2015  . Influenza Whole 09/21/2007, 07/23/2010, 10/03/2016  . Influenza, High Dose Seasonal PF 07/27/2015  . Influenza-Unspecified 10/03/2013, 09/03/2014  . Pneumococcal Conjugate-13 04/12/2014  . Pneumococcal Polysaccharide-23 07/27/2015  . Pneumococcal-Unspecified 11/03/2004  . Tdap 08/12/2011    Past Medical History:  Diagnosis Date  . Arthritis    OA RIGHT KNEE WITH PAIN  . Barrett esophagus   . Chronic respiratory failure (HCC)   . COPD (chronic obstructive pulmonary disease) (HCC)   . GERD (gastroesophageal reflux disease)   . History of ARDS 2006  . History of home oxygen therapy    AT NIGHT WHEN SLEEPING 2 L / MIN NASAL CANNULA  . Hypertension   .  Hypothyroidism   . NSTEMI (non-ST elevated myocardial infarction) (HCC) 05/31/2015  . Pneumococcal pneumonia (HCC) 2006   HOSPITALIZED AND DEVELOPED ARDS  . Psoriatic arthritis (HCC)   . Pulmonary fibrosis (HCC)   . Rheumatic disease   . SOB (shortness of breath) on exertion     Tobacco History: History  Smoking Status  . Former Smoker  . Packs/day: 1.50  . Years: 58.50  . Types: Cigarettes  . Quit date: 03/03/2005  Smokeless Tobacco  . Never Used   Counseling given: Not Answered   Outpatient Encounter Prescriptions as of 02/10/2017  Medication Sig  . acetaminophen (TYLENOL ARTHRITIS PAIN) 650 MG CR tablet Take 1,300 mg by mouth every 8 (eight) hours as needed for pain. Per bottle directions as needed  . albuterol (PROAIR HFA) 108 (90 Base) MCG/ACT inhaler 2 puffs every 4 hours as needed only  if your can't catch your breath  . aspirin EC 325 MG EC tablet Take 1 tablet (325 mg total) by mouth daily.  Marland Kitchen atorvastatin (LIPITOR) 80 MG tablet Take 1 tablet (80 mg total) by mouth daily at 6 PM.  . budesonide-formoterol (SYMBICORT) 160-4.5 MCG/ACT inhaler Inhale 2 puffs into the lungs 2 (two) times daily.  . Certolizumab Pegol (CIMZIA  [place]) Inject into the skin as directed. Cimzia, Injected by provider as directed  . Cholecalciferol (VITAMIN D3) 2000 UNITS TABS Take 2,000 Int'l Units by mouth daily.  . citalopram (CELEXA) 40 MG tablet TAKE 1 TABLET BY MOUTH  EVERY MORNING  . famotidine (PEPCID) 20 MG tablet One at bedtime  . furosemide (LASIX) 20 MG tablet Take 1 tablet (20 mg total) by mouth 2 (two) times daily.  Marland Kitchen levothyroxine (SYNTHROID, LEVOTHROID) 150 MCG tablet TAKE 1 TABLET BY MOUTH  DAILY BEFORE BREAKFAST  . losartan (COZAAR) 50 MG tablet TAKE 1 TABLET BY MOUTH  DAILY  . montelukast (SINGULAIR) 10 MG tablet Take 1 tablet (10 mg total) by mouth at bedtime.  . NON FORMULARY 2lpm with sleep and with exertion if needed  . pantoprazole (PROTONIX) 40 MG tablet TAKE 1 TABLET BY MOUTH   DAILY 30 TO 60 MINUTES  BEFORE FIRST MEAL OF THE  DAY  . traMADol (ULTRAM) 50 MG tablet TAKE 1-2 TABLETS EVERY 6 HOURS AS NEEDED FOR PAIN  . triamcinolone cream (KENALOG) 0.1 % Apply 1 application topically 2 (two) times daily as needed (for psorisis).  Marland Kitchen amoxicillin-clavulanate (AUGMENTIN) 875-125 MG tablet Take 1 tablet by mouth 2 (two) times daily.  . predniSONE (DELTASONE) 10 MG tablet 4 tabs for 2 days, then 3 tabs for 2 days, 2 tabs for 2 days, then 1 tab for 2 days, then stop   No facility-administered encounter medications on file as of 02/10/2017.      Review of Systems  Constitutional:   No  weight loss, night sweats,  Fevers, chills, fatigue, or  lassitude.  HEENT:   No headaches,  Difficulty swallowing,  Tooth/dental problems, or  + Sore throat,                No sneezing, itching, ear ache, +nasal congestion, post nasal drip,   CV:  No chest pain,  Orthopnea, PND, swelling in lower extremities, anasarca, dizziness, palpitations, syncope.   GI  No heartburn, indigestion, abdominal pain, nausea, vomiting, diarrhea, change in bowel habits, loss of appetite, bloody stools.   Resp:    No chest wall deformity  Skin: no rash or lesions.  GU: no dysuria, change in color of urine, no urgency or frequency.  No flank pain, no hematuria   MS:  No joint pain or swelling.  No decreased range of motion.  No back pain.    Physical Exam  BP 126/74 (BP Location: Left Arm, Cuff Size: Normal)   Pulse 96   Temp 99.8 F (37.7 C) (Oral)   SpO2 91%   GEN: A/Ox3; pleasant , NAD, obese    HEENT:  [place]/AT,  EACs-clear, TMs-wnl, NOSE-clear, THROAT-clear, no lesions, no postnasal drip or exudate noted.   NECK:  Supple w/ fair ROM; no JVD; normal carotid impulses w/o bruits; no thyromegaly or nodules palpated; no lymphadenopathy.    RESP  Few trace wheezing , speaking in full sentences ,  no accessory muscle use, no dullness to percussion  CARD:  RRR, no m/r/g, no peripheral edema,  pulses intact, no cyanosis or clubbing.  GI:   Soft & nt; nml bowel sounds; no organomegaly or masses detected.   Musco: Warm bil, no deformities or joint swelling noted.   Neuro: alert, no focal deficits noted.    Skin: Warm, no lesions or rashes    Lab Results:  CBC  Imaging: Dg Chest 2 View  Result Date: 02/10/2017 CLINICAL DATA:  Cough, shortness of breath for  6 days, former smoking history EXAM: CHEST  2 VIEW COMPARISON:  Chest x-ray of 07/22/2006 FINDINGS: The lungs are not well aerated. Prominent coarse interstitial lung markings are most consistent with interstitial lung disease. No pneumonia or effusion is seen. Mediastinal and hilar contours are unremarkable. Cardiomegaly is stable and median sternotomy sutures appear intact. Vague opacity on the lateral view in the retrosternal region is unchanged and probably represents overlapping fibrotic change. No bony abnormality is seen. IMPRESSION: Stable chronic fibrotic change.  No definite active process. Electronically Signed   By: Dwyane Dee M.D.   On: 02/10/2017 16:02     Assessment & Plan:   Acute bronchitis Flare  CXR w/ no sign of PNA - reviewed personally   Plan  Patient Instructions  Augmentin 875mg  Twice daily  For 7 days , take w/ food .  Mucinex DM Twice daily  As needed  Cough/congestion  Prednisone taper over next week.  Continue on Symbicort .  Continue on Oxygen 2l/m .  Follow up with Dr.  In 2 months and As needed   Please contact office for sooner follow up if symptoms do not improve or worsen or seek emergency care      Chronic respiratory failure with hypoxia and hypercapnia (HCC) Cont on O2 .   Cough variant asthma Cont on Symbicort      Armour Villanueva, NP 02/10/2017

## 2017-02-10 NOTE — Telephone Encounter (Signed)
Pt states that she sent an e-mail to our office 02/09/17 regarding some sick symptoms and she got a messages back from the front staff stating that Triage would call her back shortly -- I cannot find this e-mail in her chart.   Per message started for triage the e-mail stated : "I have a very bad cough and muscle soreness from heavy coughing! Want to make sure it doesn't change to pneumonia."  Pt c/o bad cough and muscle soreness in torso from the heavy cough. Pt having very yellow phlegm. Waking at night with coughing and increased SOB. Pt is concerned with possible PNA.   Pt scheduled with TP this afternoon at 4pm with CXR prior. Pt aware to arrive between 3:30/4:35 for her cxr prior to her appt. Shanda Bumps is aware. CXR ordered. Nothing further needed.

## 2017-02-10 NOTE — Telephone Encounter (Signed)
Patient returning call - she can be reached at [phone] -pr  °

## 2017-02-10 NOTE — Progress Notes (Signed)
Chart and office note reviewed in detail along with available xrays/ labs > agree with a/p as outlined  

## 2017-02-10 NOTE — Patient Instructions (Signed)
Augmentin 875mg  Twice daily  For 7 days , take w/ food .  Mucinex DM Twice daily  As needed  Cough/congestion  Prednisone taper over next week.  Continue on Symbicort .  Continue on Oxygen 2l/m .  Follow up with Dr.  In 2 months and As needed   Please contact office for sooner follow up if symptoms do not improve or worsen or seek emergency care

## 2017-02-10 NOTE — Telephone Encounter (Signed)
lmomtcb x1 

## 2017-02-23 DIAGNOSIS — Z803 Family history of malignant neoplasm of breast: Secondary | ICD-10-CM | POA: Diagnosis not present

## 2017-02-23 DIAGNOSIS — Z1231 Encounter for screening mammogram for malignant neoplasm of breast: Secondary | ICD-10-CM | POA: Diagnosis not present

## 2017-02-23 LAB — HM MAMMOGRAPHY

## 2017-02-25 ENCOUNTER — Encounter: Payer: Self-pay | Admitting: Family Medicine

## 2017-02-26 ENCOUNTER — Encounter: Payer: Self-pay | Admitting: Internal Medicine

## 2017-02-26 DIAGNOSIS — N6489 Other specified disorders of breast: Secondary | ICD-10-CM | POA: Diagnosis not present

## 2017-02-26 DIAGNOSIS — R921 Mammographic calcification found on diagnostic imaging of breast: Secondary | ICD-10-CM | POA: Diagnosis not present

## 2017-03-04 ENCOUNTER — Encounter: Payer: Self-pay | Admitting: Internal Medicine

## 2017-03-04 ENCOUNTER — Other Ambulatory Visit: Payer: Self-pay | Admitting: Radiology

## 2017-03-04 DIAGNOSIS — N6011 Diffuse cystic mastopathy of right breast: Secondary | ICD-10-CM | POA: Diagnosis not present

## 2017-03-04 DIAGNOSIS — R921 Mammographic calcification found on diagnostic imaging of breast: Secondary | ICD-10-CM | POA: Diagnosis not present

## 2017-03-10 DIAGNOSIS — M0589 Other rheumatoid arthritis with rheumatoid factor of multiple sites: Secondary | ICD-10-CM | POA: Diagnosis not present

## 2017-03-25 ENCOUNTER — Encounter: Payer: Self-pay | Admitting: Cardiology

## 2017-03-26 ENCOUNTER — Encounter: Payer: Self-pay | Admitting: *Deleted

## 2017-03-27 ENCOUNTER — Telehealth: Payer: Self-pay | Admitting: Internal Medicine

## 2017-03-27 ENCOUNTER — Telehealth: Payer: Self-pay | Admitting: Cardiology

## 2017-03-27 NOTE — Telephone Encounter (Signed)
Pt was asking should she ask her Pulmonologist for exercise clearance, like [name] provided the pt in a letter earlier this week. Advised the pt that yes, she should contact Dr Thurston Hole office, so he may provide clearance for her to start her new monitored exercise program.  Advised the pt to mychart Dr Sherene Sires and his RN for this, as she did with [name].  Pt verbalized understanding and agrees with this plan.

## 2017-03-27 NOTE — Telephone Encounter (Signed)
ATC x2 but received busy tone each time. Will attempt to call back later.

## 2017-03-27 NOTE — Telephone Encounter (Signed)
New Message  Pt voiced to return call in reference to letter for her to exercise.  Please f/u

## 2017-03-31 NOTE — Telephone Encounter (Signed)
lmomtcb x 2 for the pt to call us back regarding this message.

## 2017-04-01 NOTE — Telephone Encounter (Signed)
Pt returning call to nurse [phone].Caren Griffins

## 2017-04-01 NOTE — Telephone Encounter (Signed)
Spoke with the pt and notified of recs per MW  She verbalized understanding  Nothing further needed 

## 2017-04-01 NOTE — Telephone Encounter (Signed)
Spoke with patient regarding her o2 question. She states that she has a Systems analyst and the trainer wants to know what is the lowest oxygen level she can exercise at. She stated that she exercises with 2L of oxygen. She denied any symptoms of SOB or chest pains during workouts. Just need this documentation for her trainer.    MW, please advise. Thanks!

## 2017-04-01 NOTE — Telephone Encounter (Signed)
Patient returning call - she can be reached at [phone] -pr

## 2017-04-01 NOTE — Telephone Encounter (Signed)
The goal is to keep sats > 90% with exercise, whatever it takes in terms of turning up the flow just for the duration of the exercise and at max flow and still desats needs to exercise at the lower level of exertion which did not cause the desat

## 2017-04-01 NOTE — Telephone Encounter (Signed)
Spoke with the pt  She states that her exercise trainer is needing to know what her "xo2" is I asked if she meant they needed specific parameters for her o2 sats and she said no  She kept saying "xo2" I advised she needs to call her trainer to see what this means and call us back  She will do so

## 2017-04-08 DIAGNOSIS — M0589 Other rheumatoid arthritis with rheumatoid factor of multiple sites: Secondary | ICD-10-CM | POA: Diagnosis not present

## 2017-04-20 DIAGNOSIS — M7989 Other specified soft tissue disorders: Secondary | ICD-10-CM | POA: Diagnosis not present

## 2017-04-20 DIAGNOSIS — Z6841 Body Mass Index (BMI) 40.0 and over, adult: Secondary | ICD-10-CM | POA: Diagnosis not present

## 2017-04-20 DIAGNOSIS — Z79899 Other long term (current) drug therapy: Secondary | ICD-10-CM | POA: Diagnosis not present

## 2017-04-20 DIAGNOSIS — M0589 Other rheumatoid arthritis with rheumatoid factor of multiple sites: Secondary | ICD-10-CM | POA: Diagnosis not present

## 2017-04-20 DIAGNOSIS — M150 Primary generalized (osteo)arthritis: Secondary | ICD-10-CM | POA: Diagnosis not present

## 2017-04-20 DIAGNOSIS — L409 Psoriasis, unspecified: Secondary | ICD-10-CM | POA: Diagnosis not present

## 2017-04-20 DIAGNOSIS — I252 Old myocardial infarction: Secondary | ICD-10-CM | POA: Diagnosis not present

## 2017-04-29 ENCOUNTER — Encounter: Payer: Self-pay | Admitting: Internal Medicine

## 2017-04-29 ENCOUNTER — Ambulatory Visit (INDEPENDENT_AMBULATORY_CARE_PROVIDER_SITE_OTHER): Payer: Medicare Other | Admitting: Internal Medicine

## 2017-04-29 VITALS — BP 142/80 | HR 89 | Ht 58.5 in | Wt 206.0 lb

## 2017-04-29 DIAGNOSIS — J9611 Chronic respiratory failure with hypoxia: Secondary | ICD-10-CM

## 2017-04-29 DIAGNOSIS — J8410 Pulmonary fibrosis, unspecified: Secondary | ICD-10-CM

## 2017-04-29 DIAGNOSIS — J9612 Chronic respiratory failure with hypercapnia: Secondary | ICD-10-CM

## 2017-04-29 DIAGNOSIS — J45991 Cough variant asthma: Secondary | ICD-10-CM

## 2017-04-29 NOTE — Patient Instructions (Addendum)
No change in medications  Always wear the 02 at least 2lpm when you walk and turn it up if you need to maintain over 90%  Please see patient coordinator before you leave today  to schedule pulmonary rehab  Please schedule a follow up visit in 6 months but call sooner if needed

## 2017-04-29 NOTE — Progress Notes (Signed)
Subjective:   Patient ID: [name], female    DOB: 05-19-1946    MRN: [id number]   Brief patient profile:  71 yowf quit smoking in May 2006 with dx pna/ ards resumed full activity including yardwork at wt 140 and pft's c/w restrictive changes 04/2010 at wt 170 but reported improvement on saba so maintained chronically on symbicort 160 despite no sign airflow obst on pfts which was confirmed  06/04/15    History of Present Illness  July 18, 2010  1st pulmonary office eval in ER era c/o doe x 6 months progressive indolent onset with copd vs pf.  Ultimately required hosp 8/30-9/1 dx copd/pf ? cause on sulfasalzine and ACE inhib better on 02 and advair but very hoarse with sense of chest congestion and cough with white mucus day > night.  doe x > slow adls.stop advair Start symbicort 160 2 puffs first thing  in am and 2 puffs again in pm about 12 hours later  Work on inhaler technique:   stop benazapril start benicar 40/25  one daily Use 02 sleeping and any activity other than sitting still   04/10/2014 f/u ov/Wert re: restrictive lung dz/ RA/psoriatric/ symbicort 160 2bid  Chief Complaint  Patient presents with  . Follow-up    Pt states was advised to f/u per [employer].  Pt has been having increased SOB- relates to stress from dealing with her spouse's illness. She is using o2 pretty much 24/7.   trying to do more than baseline activity and finds she needs 02 more. rec Work on inhaler technique:   Ok to just use the symbicort 160 2 puffs in am to see what difference if any this makes in your breathing  Change 02 to 2lpm 24/7 and use 3lpm with activity    06/04/15  cabg     07/22/2016  f/u ov/Wert re: PF sp ali/ AB/ symbicort 160 2bid on 02 2lpm at rest/ 3lpm with activity  Chief Complaint  Patient presents with  . Follow-up    Breathing is "not as good as it could be".  Breathing has been worse for the past 2 months. She gets winded just making the bed. She is also c/o prod cough with  yellow sputum and occ wheezing.   sob gradually worse x 2 m already tried on abx/pred per PC no better, using omeprazole 20mg  9am/ does not have a rescue inhaler at all  rec Prednisone 10 mg take  4 each am x 2 days,   2 each am x 2 days,  1 each am x 2 days and stop  Plan A = Automatic = Symbicort 160 Take 2 puffs first thing in am and then another 2 puffs about 12 hours later.  Work on inhaler technique:  Plan B = Backup Only use your albuterol(proair)  as a rescue medication Pantoprazole (protonix) 40 mg (same as omeprzole 20 mg x2)   Take  30-60 min before first meal of the day and Pepcid (famotidine)  20 mg one @  bedtime until return to office - this is the best way to tell whether stomach acid is contributing to your problem.   GERD (REFLUX)  is an extremely common  Please see patient coordinator before you leave today  to schedule portable 02 evaluation Please remember to go to the  x-ray department downstairs for your tests - we will call you with the results when they are available.   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 Follow-up    Breathing has improved some. She is coughing less, but still coughing up beige sputum.  She has not used albuterol inhaler.   cough assoc with variable nasal stuffiness / much better with pred  Worse with laughing / not typically noct but some at hs  pred helps the cough but not the doe  rec Plan A = Automatic = symicort 160 Take 2 puffs first thing in am and then another 2 puffs about 12 hours later.                                      Add singulair 10 mg one each pm Plan B = Backup Only use your albuterol as a rescue medication   10/29/2016  f/u ov/Wert re:  Cough variant asthma on symb 160 2bid and singulair  02 2lpm hs and prn daytime  Chief Complaint  Patient presents with  . Follow-up    Breathing is  overall doing well. She has occ wheezing. She rarely uses albuterol inhaler.   rarely  Needs  albuerol   Using 02 with activity 2lpm and sats ok up to 3lpm rarely needed now [facility] = can't walk a nl pace on a flat grade s sob but does fine slow and flat eg slow shopping always on 02 and planning mall walking    rec Plan A = Automatic = symicort 160 Take 2 puffs first thing in am and then another 2 puffs about 12 hours later.                                      Add singulair 10 mg one each pm Plan B = Backup Only use your albuterol as a rescue medication      04/29/2017  f/u ov/Wert re: cough variant asthma/symb 160 and singulair  Chief Complaint  Patient presents with  . Follow-up    Increased cough for the past wk- prod with yellow sputum.  She states she "always has wheezing". She has not used her albuterol inhaler.   Marland KitchenMMRC2 = can't walk a nl pace on a flat grade s sob but does fine slow and flat eg shopping ok on 2lpm pulsed but does not check sats Sleeps fine on 2lpm 30 degrees with books under mattresss > elevation about 10-20degrees and sleeps ok   No obvious day to day or daytime variability or assoc excess/ purulent sputum or mucus plugs or hemoptysis or cp or chest tightness, subjective wheeze or overt sinus or hb symptoms. No unusual exp hx or h/o childhood pna/ asthma or knowledge of premature birth.  Sleeping ok with hob elevated without nocturnal  or early am exacerbation  of respiratory  c/o's or need for noct saba. Also denies any obvious fluctuation of symptoms with weather or environmental changes or other aggravating or alleviating factors except as outlined above   Current Medications, Allergies, Complete Past Medical History, Past Surgical History, Family History, and Social History were reviewed in [employer] record.  ROS  The following are not active complaints unless bolded sore throat, dysphagia, dental problems, itching, sneezing,  nasal  congestion or excess/ purulent secretions,  ear ache,   fever, chills, sweats, unintended wt loss, classically pleuritic or exertional cp,  orthopnea pnd or leg swelling, presyncope, palpitations, abdominal pain, anorexia, nausea, vomiting, diarrhea  or change in bowel or bladder habits, change in stools or urine, dysuria,hematuria,  rash, arthralgias, visual complaints, headache, numbness, weakness or ataxia or problems with walking or coordination,  change in mood/affect or memory.               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       - CT chest 07/03/10 Nonspecific PF mostly upper lobes       - CT chest 12/03/10 acute gg changes and effusions c/w chf        - PFT's  04/12/10 FEV1  1.21 (69%) ratio 77 and no change p B2,  DLC0 56% Asthmatic bronchitis     - HFA 50% p coaching July 18, 2010 >>  90%  02/25/2011  history of mild vitamin D deficiency        Objective:   Physical Exam   amb  mod obese wf  nad    Vital signs reviewed  -  Note sats 97% on arrival 2 pulsed     wt 192 July 18, 2010 > 197 August 21, 2010 > 207 December 05, 2010  > 183  01/21/2011  > 179 02/25/2011 >189 08/10/2012 >195 09/08/12 >200 12/27/12 > 202 05/03/2013 > 06/28/2013  199 > 08/08/13 194 > 04/10/2014  203 > 04/19/2015   176 >  07/23/2015 166 > 09/16/2016  210 >  10/29/2016  209 > 04/29/2017    206    HEENT: nl   turbinates bilaterally, and oropharynx. Nl external ear canals without cough reflex - top dentures    NECK :  without JVD/Nodes/TM/ nl carotid upstrokes bilaterally   LUNGS: no acc muscle use,  Nl contour chest with minimal insp and  exp rhonchi bilaterally    CV:  RRR  no s3 or murmur or increase in P2, and no edema   ABD:  Obese but soft and nontender with nl inspiratory excursion in the supine position. No bruits or organomegaly appreciated, bowel sounds nl  MS:  Nl gait/ ext warm without deformities, calf tenderness, cyanosis or clubbing No obvious joint restrictions   SKIN: warm and dry without lesions    NEURO:  alert, approp, nl sensorium with  no motor or cerebellar deficits apparent.        I personally reviewed images and agree with radiology impression as follows:  CXR:   02/10/17 Stable chronic fibrotic change.  No definite active process.

## 2017-04-30 NOTE — Assessment & Plan Note (Signed)
pfts with erv 14% 06/04/15   Body mass index is 42.32 kg/m.  -  trending down slightly, encouraged  Lab Results  Component Value Date   TSH 2.06 03/18/2016     Contributing to gerd risk/ doe/reviewed the need and the process to achieve and maintain neg calorie balance > defer f/u primary care including intermittently monitoring thyroid status

## 2017-04-30 NOTE — Assessment & Plan Note (Signed)
-   PFT's  06/04/15  FEV1 1.20 (67 % ) ratio 83  p 6 % improvement from saba with DLCO  80 % corrects to 132 % for alv volume      - Clinical dx based on response to symbicort - 09/16/2016  After extensive coaching HFA effectiveness = 90%     FENO 09/16/2016  =   96 on symbicort 160 2bid > added singulair  Allergy profile 09/16/2016 >  Eos 0.5 /  IgE  78 neg RAST  -  Referred to rehab 04/29/2017    Her asthma is relatively well controlled at present so the main challenge is improving wt/ mobility and she appears to be motivated to follow thru on rehab and even expressed interest in gastric bypass surgery but I strongly encourage the rehab program first  I had an extended discussion with the patient reviewing all relevant studies completed to date and  lasting 15 to 20 minutes of a 25 minute visit    Each maintenance medication was reviewed in detail including most importantly the difference between maintenance and prns and under what circumstances the prns are to be triggered using an action plan format that is not reflected in the computer generated alphabetically organized AVS.    Please see AVS for specific instructions unique to this visit that I personally wrote and verbalized to the the pt in detail and then reviewed with pt  by my nurse highlighting any  changes in therapy recommended at today's visit to their plan of care.

## 2017-04-30 NOTE — Assessment & Plan Note (Signed)
-   02 dependent  since 07/02/10 >>  83% RA December 05, 2010       - ONO RA 08/05/12  :  Positive sat < 89 x 2:63m> repeat on 2lpm rec 08/12/2012  - 06/17/2013 reported desat with activity p Knee surgery > rec restart 2lpm with activity  - 06/27/2013   Walked 2lpm  x one lap @ 185 stopped due to sat 88% not sob , desat to 82% on RA just at the  Start of the walk  - 04/10/14  Sats  81% RA and then  Walked 3lpm x  2 laps @ 185 ft each stopped due to end of study, no desat  - HCO3  09/29/16  = 34   04/29/2017    rec 2lpm at rest/ 3lpm with activity /POC with goal of maint over 90%

## 2017-05-07 DIAGNOSIS — M0589 Other rheumatoid arthritis with rheumatoid factor of multiple sites: Secondary | ICD-10-CM | POA: Diagnosis not present

## 2017-05-11 ENCOUNTER — Encounter (HOSPITAL_COMMUNITY)
Admission: RE | Admit: 2017-05-11 | Discharge: 2017-05-11 | Disposition: A | Discharge status: Home / Self Care | Payer: Medicare Other | Source: Ambulatory Visit | Attending: Internal Medicine | Admitting: Internal Medicine

## 2017-05-11 ENCOUNTER — Encounter (HOSPITAL_COMMUNITY): Payer: Self-pay

## 2017-05-11 VITALS — BP 110/60 | HR 77 | Ht 59.0 in | Wt 213.2 lb

## 2017-05-11 DIAGNOSIS — J9610 Chronic respiratory failure, unspecified whether with hypoxia or hypercapnia: Secondary | ICD-10-CM | POA: Insufficient documentation

## 2017-05-11 DIAGNOSIS — Z79899 Other long term (current) drug therapy: Secondary | ICD-10-CM | POA: Insufficient documentation

## 2017-05-11 DIAGNOSIS — J449 Chronic obstructive pulmonary disease, unspecified: Secondary | ICD-10-CM | POA: Diagnosis not present

## 2017-05-11 DIAGNOSIS — M1711 Unilateral primary osteoarthritis, right knee: Secondary | ICD-10-CM | POA: Insufficient documentation

## 2017-05-11 DIAGNOSIS — J8410 Pulmonary fibrosis, unspecified: Secondary | ICD-10-CM | POA: Diagnosis not present

## 2017-05-11 DIAGNOSIS — L4050 Arthropathic psoriasis, unspecified: Secondary | ICD-10-CM | POA: Diagnosis not present

## 2017-05-11 DIAGNOSIS — E039 Hypothyroidism, unspecified: Secondary | ICD-10-CM | POA: Diagnosis not present

## 2017-05-11 DIAGNOSIS — I252 Old myocardial infarction: Secondary | ICD-10-CM | POA: Insufficient documentation

## 2017-05-11 DIAGNOSIS — Z87891 Personal history of nicotine dependence: Secondary | ICD-10-CM | POA: Diagnosis not present

## 2017-05-11 DIAGNOSIS — I10 Essential (primary) hypertension: Secondary | ICD-10-CM | POA: Diagnosis not present

## 2017-05-11 DIAGNOSIS — K219 Gastro-esophageal reflux disease without esophagitis: Secondary | ICD-10-CM | POA: Diagnosis not present

## 2017-05-11 DIAGNOSIS — Z7982 Long term (current) use of aspirin: Secondary | ICD-10-CM | POA: Insufficient documentation

## 2017-05-11 DIAGNOSIS — Z7951 Long term (current) use of inhaled steroids: Secondary | ICD-10-CM | POA: Diagnosis not present

## 2017-05-11 NOTE — Progress Notes (Signed)
[name] 71 y.o. female Pulmonary Rehab Orientation Note Patient arrived today in [facility] and [facility] Rehab for orientation to Pulmonary Rehab. She was transported from [facility] via wheel chair. She does carry portable oxygen. Per pt, she uses oxygen intermittently depending on her activity. Color good, skin warm and dry. Patient is oriented to time and place. Patient's medical history, psychosocial health, and medications reviewed. Psychosocial assessment reveals pt lives alone, her husband died almost 2 years ago, . Pt is currently retired as a Building services engineer at [employer]. Pt hobbies include researching on the computer, crocheting. Pt reports her stress level is moderate. Areas of stress/anxiety include due to the loss of her dear husband in the past 2 years.  Pt does exhibit  signs of depression. Signs of depression include crying spells, helplessness, hopelessness and sadness and difficulty falling asleep, difficulty maintaining sleep and fatigue. PHQ2/9 score 5/14. Pt shows poor coping skills with negative outlook .  offered emotional support and reassurance. Referred patient to see her PCP for assistance with her depression.  She will call for an appointment today.  Will continue to monitor and evaluate progress toward psychosocial goal(s) of ability to sleep and feel better about herself. Physical assessment reveals heart rate is normal, breath sounds clear to auscultation, no wheezes, rales, or rhonchi, diminished. Grip strength equal, strong. Distal pulses 2+ bilateral posterior tibial pulses present. Patient reports she does take medications as prescribed. Patient states she follows a Regular diet. she has gained 40 pounds over the last 2 years due to not cooking and snacking on poor nutrition choices since her husband died.. Patient's weight will be monitored closely. Demonstration and practice of PLB using pulse oximeter. Patient able to return demonstration satisfactorily. Safety and hand  hygiene in the exercise area reviewed with patient. Patient voices understanding of the information reviewed. Department expectations discussed with patient and achievable goals were set. The patient shows enthusiasm about attending the program and we look forward to working with this nice lady. The patient is scheduled for a 6 min walk test on Tuesday, May 19, 2017 @ 3:15 pm and to begin exercise on Tuesday, May 26, 2017 in the 1:30 pm class.  [id number]

## 2017-05-13 ENCOUNTER — Encounter (HOSPITAL_COMMUNITY): Payer: Self-pay | Admitting: *Deleted

## 2017-05-15 ENCOUNTER — Telehealth: Payer: Self-pay | Admitting: Internal Medicine

## 2017-05-15 NOTE — Telephone Encounter (Signed)
Pt needs tramadol 50 mg #30 sent to local pharm [facility] [address]. Pt will be waiting on mailorder

## 2017-05-18 MED ORDER — TRAMADOL HCL 50 MG PO TABS: ORAL_TABLET | ORAL | 0 refills | Status: DC

## 2017-05-18 NOTE — Telephone Encounter (Signed)
Medication was refilled as requested by pt.  

## 2017-05-19 ENCOUNTER — Ambulatory Visit (HOSPITAL_COMMUNITY): Payer: Medicare Other

## 2017-05-21 ENCOUNTER — Encounter (HOSPITAL_COMMUNITY)
Admission: RE | Admit: 2017-05-21 | Discharge: 2017-05-21 | Disposition: A | Discharge status: Home / Self Care | Payer: Medicare Other | Source: Ambulatory Visit | Attending: Internal Medicine | Admitting: Internal Medicine

## 2017-05-21 DIAGNOSIS — J8410 Pulmonary fibrosis, unspecified: Secondary | ICD-10-CM

## 2017-05-21 DIAGNOSIS — J9610 Chronic respiratory failure, unspecified whether with hypoxia or hypercapnia: Secondary | ICD-10-CM | POA: Diagnosis not present

## 2017-05-21 DIAGNOSIS — Z79899 Other long term (current) drug therapy: Secondary | ICD-10-CM | POA: Diagnosis not present

## 2017-05-21 DIAGNOSIS — Z7951 Long term (current) use of inhaled steroids: Secondary | ICD-10-CM | POA: Diagnosis not present

## 2017-05-21 DIAGNOSIS — Z7982 Long term (current) use of aspirin: Secondary | ICD-10-CM | POA: Diagnosis not present

## 2017-05-21 DIAGNOSIS — J449 Chronic obstructive pulmonary disease, unspecified: Secondary | ICD-10-CM | POA: Diagnosis not present

## 2017-05-21 NOTE — Progress Notes (Signed)
[name] 71 y.o. female   DOB: 1946-03-24 MRN: [id number]          Nutrition Note Post-inflammatory pulmonary fibrosis Past Medical History:  Diagnosis Date  . Arthritis    OA RIGHT KNEE WITH PAIN  . Barrett esophagus   . Chronic respiratory failure (HCC)   . COPD (chronic obstructive pulmonary disease) (HCC)   . GERD (gastroesophageal reflux disease)   . History of ARDS 2006  . History of home oxygen therapy    AT NIGHT WHEN SLEEPING 2 L / MIN NASAL CANNULA  . Hypertension   . Hypothyroidism   . NSTEMI (non-ST elevated myocardial infarction) (HCC) 05/31/2015  . Pneumococcal pneumonia (HCC) 2006   HOSPITALIZED AND DEVELOPED ARDS  . Psoriatic arthritis (HCC)   . Pulmonary fibrosis (HCC)   . Rheumatic disease   . SOB (shortness of breath) on exertion    Meds reviewed. Ht: Ht Readings from Last 1 Encounters:  05/11/17 4\' 11"  (1.499 m)    Wt:  Wt Readings from Last 3 Encounters:  05/11/17 213 lb 3 oz (96.7 kg)  04/29/17 206 lb (93.4 kg)  01/05/17 207 lb (93.9 kg)    BMI: 43.1    Current tobacco use? No  Labs:  No recent labs noted  Nutrition Diagnosis ? Food-and nutrition-related knowledge deficit related to lack of exposure to information as related to diagnosis of pulmonary disease ? Obesity related to excessive energy intake as evidenced by a BMI of 43.1  Nutrition Intervention ? Pt's individual nutrition plan and goals reviewed with pt. ? Benefits of adopting healthy eating habits discussed when pt's Rate Your Plate reviewed.  Goal(s) 1. Identify food quantities necessary to achieve wt loss of  -2# per week to a goal wt loss of 2.7-10.9 kg (6-24 lb) at graduation from pulmonary rehab. Plan:  Pt to attend Pulmonary Nutrition class Will provide client-centered nutrition education as part of interdisciplinary care.   Monitor and evaluate progress toward nutrition goal with team.  Monitor and Evaluate progress toward nutrition goal with team.   03-14-1983,  M.Ed, RD, LDN, CDE 05/21/2017 8:26 AM

## 2017-05-22 NOTE — Progress Notes (Signed)
Pulmonary Individual Treatment Plan  Patient Details  Name: [name] MRN: [id number] Date of Birth: [birth year] Referring Provider:     Pulmonary Rehab Walk Test from 05/21/2017 in [facility]  Referring Provider  Dr. Sherene Sires      Initial Encounter Date:    Pulmonary Rehab Walk Test from 05/21/2017 in [facility] CARDI[facility] REHAB  Date  05/22/17  Referring Provider  Dr. Sherene Sires      Visit Diagnosis: Pulmonary fibrosis (HCC)  Patient's Home Medications on Admission:   Current Outpatient Prescriptions:  .  acetaminophen (TYLENOL ARTHRITIS PAIN) 650 MG CR tablet, Take 1,300 mg by mouth every 8 (eight) hours as needed for pain. Per bottle directions as needed, Disp: , Rfl:  .  albuterol (PROAIR HFA) 108 (90 Base) MCG/ACT inhaler, 2 puffs every 4 hours as needed only  if your can't catch your breath, Disp: 1 Inhaler, Rfl: 11 .  aspirin EC 325 MG EC tablet, Take 1 tablet (325 mg total) by mouth daily., Disp: 30 tablet, Rfl: 0 .  atorvastatin (LIPITOR) 80 MG tablet, Take 1 tablet (80 mg total) by mouth daily at 6 PM., Disp: 90 tablet, Rfl: 3 .  budesonide-formoterol (SYMBICORT) 160-4.5 MCG/ACT inhaler, Inhale 2 puffs into the lungs 2 (two) times daily., Disp: 3 Inhaler, Rfl: 6 .  Certolizumab Pegol (CIMZIA [place]), Inject into the skin as directed. Cimzia, Injected by provider as directed, Disp: , Rfl:  .  Cholecalciferol (VITAMIN D3) 2000 UNITS TABS, Take 2,000 Int'l Units by mouth daily., Disp: , Rfl:  .  citalopram (CELEXA) 40 MG tablet, TAKE 1 TABLET BY MOUTH  EVERY MORNING, Disp: 90 tablet, Rfl: 1 .  famotidine (PEPCID) 20 MG tablet, One at bedtime (Patient not taking: Reported on 05/11/2017), Disp: , Rfl:  .  furosemide (LASIX) 20 MG tablet, Take 1 tablet (20 mg total) by mouth 2 (two) times daily., Disp: 180 tablet, Rfl: 3 .  levothyroxine (SYNTHROID, LEVOTHROID) 150 MCG tablet, TAKE 1 TABLET BY MOUTH  DAILY BEFORE BREAKFAST, Disp: 90 tablet, Rfl:  3 .  losartan (COZAAR) 50 MG tablet, TAKE 1 TABLET BY MOUTH  DAILY, Disp: 90 tablet, Rfl: 3 .  montelukast (SINGULAIR) 10 MG tablet, Take 1 tablet (10 mg total) by mouth at bedtime., Disp: 90 tablet, Rfl: 3 .  NON FORMULARY, 2lpm with sleep and with exertion if needed, Disp: , Rfl:  .  pantoprazole (PROTONIX) 40 MG tablet, TAKE 1 TABLET BY MOUTH  DAILY 30 TO 60 MINUTES  BEFORE FIRST MEAL OF THE  DAY, Disp: 90 tablet, Rfl: 2 .  traMADol (ULTRAM) 50 MG tablet, TAKE 1-2 TABLETS EVERY 6 HOURS AS NEEDED FOR PAIN, Disp: 60 tablet, Rfl: 0 .  triamcinolone cream (KENALOG) 0.1 %, Apply 1 application topically 2 (two) times daily as needed (for psorisis)., Disp: , Rfl:   Past Medical History: Past Medical History:  Diagnosis Date  . Arthritis    OA RIGHT KNEE WITH PAIN  . Barrett esophagus   . Chronic respiratory failure (HCC)   . COPD (chronic obstructive pulmonary disease) (HCC)   . GERD (gastroesophageal reflux disease)   . History of ARDS 2006  . History of home oxygen therapy    AT NIGHT WHEN SLEEPING 2 L / MIN NASAL CANNULA  . Hypertension   . Hypothyroidism   . NSTEMI (non-ST elevated myocardial infarction) (HCC) 05/31/2015  . Pneumococcal pneumonia (HCC) 2006   HOSPITALIZED AND DEVELOPED ARDS  . Psoriatic arthritis (HCC)   .  Pulmonary fibrosis (HCC)   . Rheumatic disease   . SOB (shortness of breath) on exertion     Tobacco Use: History  Smoking Status  . Former Smoker  . Packs/day: 1.50  . Years: 58.50  . Types: Cigarettes  . Quit date: 03/03/2005  Smokeless Tobacco  . Never Used    Labs: Recent Review Flowsheet Data    Labs for ITP Cardiac and Pulmonary Rehab Latest Ref Rng & Units 06/05/2015 06/05/2015 06/05/2015 07/12/2015 09/29/2016   Cholestrol <200 mg/dL - - - 096 045   LDLCALC <100 mg/dL - - - 51 61   LDLDIRECT mg/dL - - - - -   HDL >40 mg/dL - - - 51 76   Trlycerides <150 mg/dL - - - 75 981   Hemoglobin A1c 4.8 - 5.6 % - - - - -   PHART 7.350 - 7.450 7.268(L) - - - -    PCO2ART 35.0 - 45.0 mmHg 55.6(H) - - - -   HCO3 20.0 - 24.0 mEq/L 25.5(H) - - - -   TCO2 0 - 100 mmol/L 27 14 25  - -   ACIDBASEDEF 0.0 - 2.0 mmol/L 2.0 - - - -   O2SAT % 98.0 - - - -      Capillary Blood Glucose: Lab Results  Component Value Date   GLUCAP 113 (H) 06/06/2015   GLUCAP 107 (H) 06/06/2015   GLUCAP 128 (H) 06/05/2015   GLUCAP 114 (H) 06/05/2015   GLUCAP 109 (H) 06/05/2015     ADL [facility]:     Pulmonary Assessment Scores    Row Name 05/13/17 0941 05/22/17 1008       ADL [facility]   ADL Phase Entry Entry    SOB Score total 65  -      CAT Score   CAT Score 17  Entry  -      mMRC Score   mMRC Score  - 1       Pulmonary Function Assessment:     Pulmonary Function Assessment - 05/11/17 1103      Breath   Bilateral Breath Sounds Clear;Decreased   Shortness of Breath Yes;Limiting activity      Exercise Target Goals: Date: 05/22/17  Exercise Program Goal: Individual exercise prescription set with THRR, safety & activity barriers. Participant demonstrates ability to understand and report RPE using BORG scale, to self-measure pulse accurately, and to acknowledge the importance of the exercise prescription.  Exercise Prescription Goal: Starting with aerobic activity 30 plus minutes a day, 3 days per week for initial exercise prescription. Provide home exercise prescription and guidelines that participant acknowledges understanding prior to discharge.  Activity Barriers & Risk Stratification:   6 Minute Walk:     6 Minute Walk    Row Name 05/22/17 1001 05/22/17 1007       6 Minute Walk   Phase Initial  -    Distance 1010 feet  -    Walk Time 6 minutes  -    # of Rest Breaks 0  -    MPH 1.91  -    METS 2.45  -    RPE 17  -    Perceived Dyspnea  3  -    Symptoms Yes (comment)  -    Comments used wheelchair  -    Resting HR 88 bpm  -    Resting BP 139/76  -    Max Ex. HR 140 bpm  -    Max Ex.  BP 197/100  RECHECK: 209/75, 188/94, after seated  for 20 minutes 154/84  -      Interval HR   Baseline HR 88  -    1 Minute HR 116  -    2 Minute HR 118  -    3 Minute HR 126  -    4 Minute HR 136  -    5 Minute HR 140  -    6 Minute HR 136  -    2 Minute Post HR 121  -    Interval Heart Rate? Yes  -      Interval Oxygen   Interval Oxygen? Yes  -    Baseline Oxygen Saturation % 92 %  -    Baseline Liters of Oxygen 2 L  -    1 Minute Oxygen Saturation % 87 %  -    1 Minute Liters of Oxygen 2 L  -    2 Minute Oxygen Saturation % 86 %  -    2 Minute Liters of Oxygen 2 L  -    3 Minute Oxygen Saturation % 86 %  -    3 Minute Liters of Oxygen 2 L  -    4 Minute Oxygen Saturation % 91 %  -    4 Minute Liters of Oxygen 2 L 3 L    5 Minute Oxygen Saturation % 93 %  -    5 Minute Liters of Oxygen 2 L 3 L    6 Minute Oxygen Saturation % 90 %  -    6 Minute Liters of Oxygen 2 L 3 L    2 Minute Post Oxygen Saturation % 94 %  -    2 Minute Post Liters of Oxygen 2 L 3 L       Oxygen Initial Assessment:     Oxygen Initial Assessment - 05/22/17 1007      Initial 6 min Walk   Oxygen Used Continuous;E-Tanks   Liters per minute 2   Resting Oxygen Saturation  during 6 min walk 92 %   Exercise Oxygen Saturation  during 6 min walk 86 %  INCREASED TO 3 LITERS     Program Oxygen Prescription   Program Oxygen Prescription Continuous;E-Tanks   Liters per minute 3      Oxygen Re-Evaluation:   Oxygen Discharge (Final Oxygen Re-Evaluation):   Initial Exercise Prescription:     Initial Exercise Prescription - 05/22/17 1000      Date of Initial Exercise RX and Referring Provider   Date 05/22/17   Referring Provider Dr. Sherene Sires     Oxygen   Oxygen Continuous   Liters 3     NuStep   Level 2   Minutes 17   METs 1.5     Arm Ergometer   Level 2   Minutes 17     Track   Laps 10   Minutes 17     Prescription Details   Frequency (times per week) 2   Duration Progress to 45 minutes of aerobic exercise without  signs/symptoms of physical distress     Intensity   THRR 40-80% of Max Heartrate 60-120   Ratings of Perceived Exertion 11-13   Perceived Dyspnea 0-4     Progression   Progression Continue progressive overload as per policy without signs/symptoms or physical distress.     Resistance Training   Training Prescription Yes   Weight blue bands   Reps 10-15  Perform Capillary Blood Glucose checks as needed.  Exercise Prescription Changes:   Exercise Comments:   Exercise Goals and Review:   Exercise Goals Re-Evaluation :   Discharge Exercise Prescription (Final Exercise Prescription Changes):   Nutrition:  Target Goals: Understanding of nutrition guidelines, daily intake of sodium 1500mg , cholesterol 200mg , calories 30% from fat and 7% or less from saturated fats, daily to have 5 or more servings of fruits and vegetables.  Biometrics:     Pre Biometrics - 05/11/17 1107      Pre Biometrics   Grip Strength 33 kg       Nutrition Therapy Plan and Nutrition Goals:     Nutrition Therapy & Goals - 05/21/17 0829      Nutrition Therapy   Diet General, Healthful     Personal Nutrition Goals   Nutrition Goal Identify food quantities necessary to achieve wt loss of  -2# per week to a goal wt loss of 2.7-10.9 kg (6-24 lb) at graduation from pulmonary rehab.     Intervention Plan   Intervention Prescribe, educate and counsel regarding individualized specific dietary modifications aiming towards targeted core components such as weight, hypertension, lipid management, diabetes, heart failure and other comorbidities.   Expected Outcomes Short Term Goal: Understand basic principles of dietary content, such as calories, fat, sodium, cholesterol and nutrients.;Long Term Goal: Adherence to prescribed nutrition plan.      Nutrition Discharge: Rate Your Plate Scores:     Nutrition Assessments - 05/21/17 0826      Rate Your Plate Scores   Pre Score 54      Nutrition  Goals Re-Evaluation:   Nutrition Goals Discharge (Final Nutrition Goals Re-Evaluation):   Psychosocial: Target Goals: Acknowledge presence or absence of significant depression and/or stress, maximize coping skills, provide positive support system. Participant is able to verbalize types and ability to use techniques and skills needed for reducing stress and depression.  Initial Review & Psychosocial Screening:     Initial Psych Review & Screening - 05/11/17 1113      Initial Review   Current issues with History of Depression     Family Dynamics   Good Support System? Yes     Barriers   Psychosocial barriers to participate in program The patient should benefit from training in stress management and relaxation.     Screening Interventions   Interventions Encouraged to exercise      Quality of Life Scores:   PHQ-9: Recent Review Flowsheet Data    Depression screen [facility] 2/9 05/11/2017 06/20/2016 04/12/2014   Decreased Interest 3 0 0   Down, Depressed, Hopeless 2 0 0   PHQ - 2 Score 5 0 0   Altered sleeping 3 - -   Tired, decreased energy 3 - -   Change in appetite 3 - -   Feeling bad or failure about yourself  0 - -   Trouble concentrating 0 - -   Moving slowly or fidgety/restless 0 - -   Suicidal thoughts 0 - -   PHQ-9 Score 14 - -   Difficult doing work/chores Somewhat difficult - -     Interpretation of Total Score  Total Score Depression Severity:  1-4 = Minimal depression, 5-9 = Mild depression, 10-14 = Moderate depression, 15-19 = Moderately severe depression, 20-27 = Severe depression   Psychosocial Evaluation and Intervention:     Psychosocial Evaluation - 05/11/17 1116      Psychosocial Evaluation & Interventions   Interventions Physician referral;Therapist referral;Encouraged to exercise  with the program and follow exercise prescription   Continue Psychosocial Services  Follow up required by counselor      Psychosocial Re-Evaluation:   Psychosocial  Discharge (Final Psychosocial Re-Evaluation):   Education: Education Goals: Education classes will be provided on a weekly basis, covering required topics. Participant will state understanding/return demonstration of topics presented.  Learning Barriers/Preferences:     Learning Barriers/Preferences - 05/11/17 1102      Learning Barriers/Preferences   Learning Barriers None   Learning Preferences Video;Pictoral;Audio;Computer/Internet      Education Topics: Risk Factor Reduction:  -Group instruction that is supported by a PowerPoint presentation. Instructor discusses the definition of a risk factor, different risk factors for pulmonary disease, and how the heart and lungs work together.     Nutrition for Pulmonary Patient:  -Group instruction provided by PowerPoint slides, verbal discussion, and written materials to support subject matter. The instructor gives an explanation and review of healthy diet recommendations, which includes a discussion on weight management, recommendations for fruit and vegetable consumption, as well as protein, fluid, caffeine, fiber, sodium, sugar, and alcohol. Tips for eating when patients are short of breath are discussed.   Pursed Lip Breathing:  -Group instruction that is supported by demonstration and informational handouts. Instructor discusses the benefits of pursed lip and diaphragmatic breathing and detailed demonstration on how to preform both.     Oxygen Safety:  -Group instruction provided by PowerPoint, verbal discussion, and written material to support subject matter. There is an overview of "What is Oxygen" and "Why do we need it".  Instructor also reviews how to create a safe environment for oxygen use, the importance of using oxygen as prescribed, and the risks of noncompliance. There is a brief discussion on traveling with oxygen and resources the patient may utilize.   Oxygen Equipment:  -Group instruction provided by [facility]  Staff utilizing handouts, written materials, and equipment demonstrations.   Signs and Symptoms:  -Group instruction provided by written material and verbal discussion to support subject matter. Warning signs and symptoms of infection, stroke, and heart attack are reviewed and when to call the physician/911 reinforced. Tips for preventing the spread of infection discussed.   Advanced Directives:  -Group instruction provided by verbal instruction and written material to support subject matter. Instructor reviews Advanced Directive laws and proper instruction for filling out document.   Pulmonary Video:  -Group video education that reviews the importance of medication and oxygen compliance, exercise, good nutrition, pulmonary hygiene, and pursed lip and diaphragmatic breathing for the pulmonary patient.   Exercise for the Pulmonary Patient:  -Group instruction that is supported by a PowerPoint presentation. Instructor discusses benefits of exercise, core components of exercise, frequency, duration, and intensity of an exercise routine, importance of utilizing pulse oximetry during exercise, safety while exercising, and options of places to exercise outside of rehab.     Pulmonary Medications:  -Verbally interactive group education provided by instructor with focus on inhaled medications and proper administration.   Anatomy and Physiology of the Respiratory System and Intimacy:  -Group instruction provided by PowerPoint, verbal discussion, and written material to support subject matter. Instructor reviews respiratory cycle and anatomical components of the respiratory system and their functions. Instructor also reviews differences in obstructive and restrictive respiratory diseases with examples of each. Intimacy, Sex, and Sexuality differences are reviewed with a discussion on how relationships can change when diagnosed with pulmonary disease. Common sexual concerns are reviewed.   MD DAY -A  group question and answer  session with a medical doctor that allows participants to ask questions that relate to their pulmonary disease state.   OTHER EDUCATION -Group or individual verbal, written, or video instructions that support the educational goals of the pulmonary rehab program.   Knowledge Questionnaire Score:     Knowledge Questionnaire Score - 05/13/17 0941      Knowledge Questionnaire Score   Pre Score 8/13      Core Components/Risk Factors/Patient Goals at Admission:     Personal Goals and Risk Factors at Admission - 05/11/17 1108      Core Components/Risk Factors/Patient Goals on Admission    Weight Management Yes   Intervention Weight Management: Develop a combined nutrition and exercise program designed to reach desired caloric intake, while maintaining appropriate intake of nutrient and fiber, sodium and fats, and appropriate energy expenditure required for the weight goal.   Admit Weight 213 lb 3 oz (96.7 kg)   Goal Weight: Short Term 202 lb 13.2 oz (92 kg)   Goal Weight: Long Term 203 lb (92.1 kg)   Expected Outcomes Short Term: Continue to assess and modify interventions until short term weight is achieved   Improve shortness of breath with ADL's Yes   Intervention Provide education, individualized exercise plan and daily activity instruction to help decrease symptoms of SOB with activities of daily living.   Expected Outcomes Short Term: Achieves a reduction of symptoms when performing activities of daily living.   Develop more efficient breathing techniques such as purse lipped breathing and diaphragmatic breathing; and practicing self-pacing with activity Yes   Intervention Provide education, demonstration and support about specific breathing techniuqes utilized for more efficient breathing. Include techniques such as pursed lipped breathing, diaphragmatic breathing and self-pacing activity.   Expected Outcomes Short Term: Participant will be able to  demonstrate and use breathing techniques as needed throughout daily activities.   Stress Yes   Intervention Offer individual and/or small group education and counseling on adjustment to heart disease, stress management and health-related lifestyle change. Teach and support self-help strategies.   Expected Outcomes Short Term: Participant demonstrates changes in health-related behavior, relaxation and other stress management skills, ability to obtain effective social support, and compliance with psychotropic medications if prescribed.      Core Components/Risk Factors/Patient Goals Review:    Core Components/Risk Factors/Patient Goals at Discharge (Final Review):    ITP Comments:   Comments:

## 2017-05-26 ENCOUNTER — Encounter (HOSPITAL_COMMUNITY): Payer: Medicare Other

## 2017-05-28 ENCOUNTER — Encounter (HOSPITAL_COMMUNITY)
Admission: RE | Admit: 2017-05-28 | Discharge: 2017-05-28 | Disposition: A | Discharge status: Home / Self Care | Payer: Medicare Other | Source: Ambulatory Visit | Attending: Internal Medicine | Admitting: Internal Medicine

## 2017-05-28 VITALS — Wt 208.1 lb

## 2017-05-28 DIAGNOSIS — Z79899 Other long term (current) drug therapy: Secondary | ICD-10-CM | POA: Diagnosis not present

## 2017-05-28 DIAGNOSIS — J9610 Chronic respiratory failure, unspecified whether with hypoxia or hypercapnia: Secondary | ICD-10-CM | POA: Diagnosis not present

## 2017-05-28 DIAGNOSIS — Z7951 Long term (current) use of inhaled steroids: Secondary | ICD-10-CM | POA: Diagnosis not present

## 2017-05-28 DIAGNOSIS — J8410 Pulmonary fibrosis, unspecified: Secondary | ICD-10-CM | POA: Diagnosis not present

## 2017-05-28 DIAGNOSIS — Z7982 Long term (current) use of aspirin: Secondary | ICD-10-CM | POA: Diagnosis not present

## 2017-05-28 DIAGNOSIS — J449 Chronic obstructive pulmonary disease, unspecified: Secondary | ICD-10-CM | POA: Diagnosis not present

## 2017-05-28 NOTE — Progress Notes (Signed)
Daily Session Note  Patient Details  Name: [name] MRN: [id number] Date of Birth: 12/03/1945 Referring Provider:     Pulmonary Rehab Walk Test from 05/21/2017 in [place]  Referring Provider  Dr. Melvyn Novas      Encounter Date: 05/28/2017  Check In:     Session Check In - 05/28/17 1510      Check-In   Location [facility]   Staff Present Su Hilt, MS, ACSM RCEP, Exercise Physiologist;Joan Leonia Reeves, RN, Roque Cash, RN   Supervising physician immediately available to respond to emergencies [facility] immediately available   Physician(s) Dr. Maryland Pink   Medication changes reported     No   Fall or balance concerns reported    No   Tobacco Cessation No Change   Warm-up and Cool-down Performed as group-led instruction   Resistance Training Performed Yes   VAD Patient? No     Pain Assessment   Currently in Pain? No/denies   Multiple Pain Sites No      Capillary Blood Glucose: No results found for this or any previous visit (from the past 24 hour(s)).      Exercise Prescription Changes - 05/28/17 1500      Response to Exercise   Blood Pressure (Admit) 144/64   Blood Pressure (Exercise) 160/76   Blood Pressure (Exit) 140/72   Heart Rate (Admit) 96 bpm   Heart Rate (Exercise) 116 bpm   Heart Rate (Exit) 104 bpm   Oxygen Saturation (Admit) 90 %   Oxygen Saturation (Exercise) 90 %   Oxygen Saturation (Exit) 92 %   Rating of Perceived Exertion (Exercise) 13   Perceived Dyspnea (Exercise) 2   Duration Continue with 45 min of aerobic exercise without signs/symptoms of physical distress.   Intensity THRR unchanged     Progression   Progression Continue to progress workloads to maintain intensity without signs/symptoms of physical distress.     Resistance Training   Training Prescription Yes   Weight blue bands   Reps 10-15     Oxygen   Oxygen Continuous   Liters 3     NuStep   Level 2   Minutes  17   METs 1.7     Track   Laps 9   Minutes 17      History  Smoking Status  . Former Smoker  . Packs/day: 1.50  . Years: 58.50  . Types: Cigarettes  . Quit date: 03/03/2005  Smokeless Tobacco  . Never Used    Goals Met:  Exercise tolerated well No report of cardiac concerns or symptoms Strength training completed today  Goals Unmet:  Not Applicable  Comments: Service time is from 1:30p to 3:00p    Dr. Rush Farmer is Medical Director for Pulmonary Rehab at [facility].

## 2017-06-01 ENCOUNTER — Ambulatory Visit: Payer: Self-pay | Admitting: Internal Medicine

## 2017-06-02 ENCOUNTER — Encounter (HOSPITAL_COMMUNITY)
Admission: RE | Admit: 2017-06-02 | Discharge: 2017-06-02 | Disposition: A | Discharge status: Home / Self Care | Payer: Medicare Other | Source: Ambulatory Visit | Attending: Internal Medicine | Admitting: Internal Medicine

## 2017-06-02 VITALS — Wt 208.3 lb

## 2017-06-02 DIAGNOSIS — Z7951 Long term (current) use of inhaled steroids: Secondary | ICD-10-CM | POA: Diagnosis not present

## 2017-06-02 DIAGNOSIS — Z79899 Other long term (current) drug therapy: Secondary | ICD-10-CM | POA: Diagnosis not present

## 2017-06-02 DIAGNOSIS — J8410 Pulmonary fibrosis, unspecified: Secondary | ICD-10-CM | POA: Diagnosis not present

## 2017-06-02 DIAGNOSIS — J9610 Chronic respiratory failure, unspecified whether with hypoxia or hypercapnia: Secondary | ICD-10-CM | POA: Diagnosis not present

## 2017-06-02 DIAGNOSIS — J449 Chronic obstructive pulmonary disease, unspecified: Secondary | ICD-10-CM | POA: Diagnosis not present

## 2017-06-02 DIAGNOSIS — Z7982 Long term (current) use of aspirin: Secondary | ICD-10-CM | POA: Diagnosis not present

## 2017-06-02 NOTE — Progress Notes (Signed)
Daily Session Note  Patient Details  Name: [name] MRN: [id number] Date of Birth: May 04, 1946 Referring Provider:     Pulmonary Rehab Walk Test from 05/21/2017 in [place]  Referring Provider  Dr. Melvyn Novas      Encounter Date: 06/02/2017  Check In:     Session Check In - 06/02/17 1546      Check-In   Location [facility]   Staff Present Seward Carol, MS, ACSM CEP, Exercise Physiologist;Molly diVincenzo, MS, ACSM RCEP, Exercise Physiologist;Lisa Ysidro Evert, RN   Supervising physician immediately available to respond to emergencies [facility] immediately available   Physician(s) Dr. Maryland Pink   Medication changes reported     No   Fall or balance concerns reported    No   Tobacco Cessation No Change   Warm-up and Cool-down Performed as group-led instruction   Resistance Training Performed Yes   VAD Patient? No     Pain Assessment   Currently in Pain? No/denies   Multiple Pain Sites No      Capillary Blood Glucose: No results found for this or any previous visit (from the past 24 hour(s)).      Exercise Prescription Changes - 06/02/17 1500      Response to Exercise   Blood Pressure (Admit) 138/70   Blood Pressure (Exercise) 168/86   Blood Pressure (Exit) 140/70   Heart Rate (Admit) 91 bpm   Heart Rate (Exercise) 116 bpm   Heart Rate (Exit) 98 bpm   Oxygen Saturation (Admit) 91 %   Oxygen Saturation (Exercise) 93 %   Oxygen Saturation (Exit) 96 %   Rating of Perceived Exertion (Exercise) 13   Perceived Dyspnea (Exercise) 2   Duration Continue with 45 min of aerobic exercise without signs/symptoms of physical distress.   Intensity THRR unchanged     Progression   Progression Continue to progress workloads to maintain intensity without signs/symptoms of physical distress.     Resistance Training   Training Prescription Yes   Weight blue bands   Reps 10-15     Oxygen   Oxygen Continuous   Liters 3     NuStep   Level 3   Minutes 17   METs 1.9     Arm Ergometer   Level 2   Minutes 17     Track   Laps 7   Minutes 17      History  Smoking Status  . Former Smoker  . Packs/day: 1.50  . Years: 58.50  . Types: Cigarettes  . Quit date: 03/03/2005  Smokeless Tobacco  . Never Used    Goals Met:  Exercise tolerated well No report of cardiac concerns or symptoms Strength training completed today  Goals Unmet:  Not Applicable  Comments: Service time is from 1330 to 1450   Dr. Rush Farmer is Medical Director for Pulmonary Rehab at [facility].

## 2017-06-02 NOTE — Progress Notes (Signed)
Pulmonary Individual Treatment Plan  Patient Details  Name: [name] MRN: [id number] Date of Birth: [birth year] Referring Provider:     Pulmonary Rehab Walk Test from 05/21/2017 in [facility]  Referring Provider  Dr. Sherene Sires      Initial Encounter Date:    Pulmonary Rehab Walk Test from 05/21/2017 in [facility] CARDI[facility] REHAB  Date  05/22/17  Referring Provider  Dr. Sherene Sires      Visit Diagnosis: Pulmonary fibrosis (HCC)  Patient's Home Medications on Admission:   Current Outpatient Prescriptions:  .  acetaminophen (TYLENOL ARTHRITIS PAIN) 650 MG CR tablet, Take 1,300 mg by mouth every 8 (eight) hours as needed for pain. Per bottle directions as needed, Disp: , Rfl:  .  albuterol (PROAIR HFA) 108 (90 Base) MCG/ACT inhaler, 2 puffs every 4 hours as needed only  if your can't catch your breath, Disp: 1 Inhaler, Rfl: 11 .  aspirin EC 325 MG EC tablet, Take 1 tablet (325 mg total) by mouth daily., Disp: 30 tablet, Rfl: 0 .  atorvastatin (LIPITOR) 80 MG tablet, Take 1 tablet (80 mg total) by mouth daily at 6 PM., Disp: 90 tablet, Rfl: 3 .  budesonide-formoterol (SYMBICORT) 160-4.5 MCG/ACT inhaler, Inhale 2 puffs into the lungs 2 (two) times daily., Disp: 3 Inhaler, Rfl: 6 .  Certolizumab Pegol (CIMZIA [place]), Inject into the skin as directed. Cimzia, Injected by provider as directed, Disp: , Rfl:  .  Cholecalciferol (VITAMIN D3) 2000 UNITS TABS, Take 2,000 Int'l Units by mouth daily., Disp: , Rfl:  .  citalopram (CELEXA) 40 MG tablet, TAKE 1 TABLET BY MOUTH  EVERY MORNING, Disp: 90 tablet, Rfl: 1 .  famotidine (PEPCID) 20 MG tablet, One at bedtime (Patient not taking: Reported on 05/11/2017), Disp: , Rfl:  .  furosemide (LASIX) 20 MG tablet, Take 1 tablet (20 mg total) by mouth 2 (two) times daily., Disp: 180 tablet, Rfl: 3 .  levothyroxine (SYNTHROID, LEVOTHROID) 150 MCG tablet, TAKE 1 TABLET BY MOUTH  DAILY BEFORE BREAKFAST, Disp: 90 tablet, Rfl:  3 .  losartan (COZAAR) 50 MG tablet, TAKE 1 TABLET BY MOUTH  DAILY, Disp: 90 tablet, Rfl: 3 .  montelukast (SINGULAIR) 10 MG tablet, Take 1 tablet (10 mg total) by mouth at bedtime., Disp: 90 tablet, Rfl: 3 .  NON FORMULARY, 2lpm with sleep and with exertion if needed, Disp: , Rfl:  .  pantoprazole (PROTONIX) 40 MG tablet, TAKE 1 TABLET BY MOUTH  DAILY 30 TO 60 MINUTES  BEFORE FIRST MEAL OF THE  DAY, Disp: 90 tablet, Rfl: 2 .  traMADol (ULTRAM) 50 MG tablet, TAKE 1-2 TABLETS EVERY 6 HOURS AS NEEDED FOR PAIN, Disp: 60 tablet, Rfl: 0 .  triamcinolone cream (KENALOG) 0.1 %, Apply 1 application topically 2 (two) times daily as needed (for psorisis)., Disp: , Rfl:   Past Medical History: Past Medical History:  Diagnosis Date  . Arthritis    OA RIGHT KNEE WITH PAIN  . Barrett esophagus   . Chronic respiratory failure (HCC)   . COPD (chronic obstructive pulmonary disease) (HCC)   . GERD (gastroesophageal reflux disease)   . History of ARDS 2006  . History of home oxygen therapy    AT NIGHT WHEN SLEEPING 2 L / MIN NASAL CANNULA  . Hypertension   . Hypothyroidism   . NSTEMI (non-ST elevated myocardial infarction) (HCC) 05/31/2015  . Pneumococcal pneumonia (HCC) 2006   HOSPITALIZED AND DEVELOPED ARDS  . Psoriatic arthritis (HCC)   .  Pulmonary fibrosis (HCC)   . Rheumatic disease   . SOB (shortness of breath) on exertion     Tobacco Use: History  Smoking Status  . Former Smoker  . Packs/day: 1.50  . Years: 58.50  . Types: Cigarettes  . Quit date: 03/03/2005  Smokeless Tobacco  . Never Used    Labs: Recent Review Flowsheet Data    Labs for ITP Cardiac and Pulmonary Rehab Latest Ref Rng & Units 06/05/2015 06/05/2015 06/05/2015 07/12/2015 09/29/2016   Cholestrol <200 mg/dL - - - 338 329   LDLCALC <100 mg/dL - - - 51 61   LDLDIRECT mg/dL - - - - -   HDL >19 mg/dL - - - 51 76   Trlycerides <150 mg/dL - - - 75 166   Hemoglobin A1c 4.8 - 5.6 % - - - - -   PHART 7.350 - 7.450 7.268(L) - - - -    PCO2ART 35.0 - 45.0 mmHg 55.6(H) - - - -   HCO3 20.0 - 24.0 mEq/L 25.5(H) - - - -   TCO2 0 - 100 mmol/L 27 14 25  - -   ACIDBASEDEF 0.0 - 2.0 mmol/L 2.0 - - - -   O2SAT % 98.0 - - - -      Capillary Blood Glucose: Lab Results  Component Value Date   GLUCAP 113 (H) 06/06/2015   GLUCAP 107 (H) 06/06/2015   GLUCAP 128 (H) 06/05/2015   GLUCAP 114 (H) 06/05/2015   GLUCAP 109 (H) 06/05/2015     ADL [facility]:     Pulmonary Assessment Scores    Row Name 05/13/17 0941 05/22/17 1008       ADL [facility]   ADL Phase Entry Entry    SOB Score total 65  -      CAT Score   CAT Score 17  Entry  -      mMRC Score   mMRC Score  - 1       Pulmonary Function Assessment:     Pulmonary Function Assessment - 05/11/17 1103      Breath   Bilateral Breath Sounds Clear;Decreased   Shortness of Breath Yes;Limiting activity      Exercise Target Goals:    Exercise Program Goal: Individual exercise prescription set with THRR, safety & activity barriers. Participant demonstrates ability to understand and report RPE using BORG scale, to self-measure pulse accurately, and to acknowledge the importance of the exercise prescription.  Exercise Prescription Goal: Starting with aerobic activity 30 plus minutes a day, 3 days per week for initial exercise prescription. Provide home exercise prescription and guidelines that participant acknowledges understanding prior to discharge.  Activity Barriers & Risk Stratification:   6 Minute Walk:     6 Minute Walk    Row Name 05/22/17 1001 05/22/17 1007       6 Minute Walk   Phase Initial  -    Distance 1010 feet  -    Walk Time 6 minutes  -    # of Rest Breaks 0  -    MPH 1.91  -    METS 2.45  -    RPE 17  -    Perceived Dyspnea  3  -    Symptoms Yes (comment)  -    Comments used wheelchair  -    Resting HR 88 bpm  -    Resting BP 139/76  -    Max Ex. HR 140 bpm  -    Max Ex.  BP 197/100  RECHECK: 209/75, 188/94, after seated for 20  minutes 154/84  -      Interval HR   Baseline HR 88  -    1 Minute HR 116  -    2 Minute HR 118  -    3 Minute HR 126  -    4 Minute HR 136  -    5 Minute HR 140  -    6 Minute HR 136  -    2 Minute Post HR 121  -    Interval Heart Rate? Yes  -      Interval Oxygen   Interval Oxygen? Yes  -    Baseline Oxygen Saturation % 92 %  -    Baseline Liters of Oxygen 2 L  -    1 Minute Oxygen Saturation % 87 %  -    1 Minute Liters of Oxygen 2 L  -    2 Minute Oxygen Saturation % 86 %  -    2 Minute Liters of Oxygen 2 L  -    3 Minute Oxygen Saturation % 86 %  -    3 Minute Liters of Oxygen 2 L  -    4 Minute Oxygen Saturation % 91 %  -    4 Minute Liters of Oxygen 2 L 3 L    5 Minute Oxygen Saturation % 93 %  -    5 Minute Liters of Oxygen 2 L 3 L    6 Minute Oxygen Saturation % 90 %  -    6 Minute Liters of Oxygen 2 L 3 L    2 Minute Post Oxygen Saturation % 94 %  -    2 Minute Post Liters of Oxygen 2 L 3 L       Oxygen Initial Assessment:     Oxygen Initial Assessment - 05/22/17 1007      Initial 6 min Walk   Oxygen Used Continuous;E-Tanks   Liters per minute 2   Resting Oxygen Saturation  during 6 min walk 92 %   Exercise Oxygen Saturation  during 6 min walk 86 %  INCREASED TO 3 LITERS     Program Oxygen Prescription   Program Oxygen Prescription Continuous;E-Tanks   Liters per minute 3      Oxygen Re-Evaluation:     Oxygen Re-Evaluation    Row Name 06/02/17 1717             Program Oxygen Prescription   Program Oxygen Prescription Continuous;E-Tanks       Liters per minute 3         Home Oxygen   Home Oxygen Device Home Concentrator;Portable Concentrator;E-Tanks       Sleep Oxygen Prescription Continuous       Liters per minute 2       Home Exercise Oxygen Prescription Continuous       Liters per minute 2       Home at Rest Exercise Oxygen Prescription None       Compliance with Home Oxygen Use Yes         Goals/Expected Outcomes   Short Term  Goals To learn and exhibit compliance with exercise, home and travel O2 prescription;To learn and understand importance of monitoring SPO2 with pulse oximeter and demonstrate accurate use of the pulse oximeter.;To Learn and understand importance of maintaining oxygen saturations>88%;To learn and demonstrate proper purse lipped breathing techniques or other breathing techniques.;To learn and demonstrate proper  use of respiratory medications       Long  Term Goals Exhibits compliance with exercise, home and travel O2 prescription;Verbalizes importance of monitoring SPO2 with pulse oximeter and return demonstration;Maintenance of O2 saturations>88%;Exhibits proper breathing techniques, such as purse lipped breathing or other method taught during program session;Compliance with respiratory medication          Oxygen Discharge (Final Oxygen Re-Evaluation):     Oxygen Re-Evaluation - 06/02/17 1717      Program Oxygen Prescription   Program Oxygen Prescription Continuous;E-Tanks   Liters per minute 3     Home Oxygen   Home Oxygen Device Home Concentrator;Portable Concentrator;E-Tanks   Sleep Oxygen Prescription Continuous   Liters per minute 2   Home Exercise Oxygen Prescription Continuous   Liters per minute 2   Home at Rest Exercise Oxygen Prescription None   Compliance with Home Oxygen Use Yes     Goals/Expected Outcomes   Short Term Goals To learn and exhibit compliance with exercise, home and travel O2 prescription;To learn and understand importance of monitoring SPO2 with pulse oximeter and demonstrate accurate use of the pulse oximeter.;To Learn and understand importance of maintaining oxygen saturations>88%;To learn and demonstrate proper purse lipped breathing techniques or other breathing techniques.;To learn and demonstrate proper use of respiratory medications   Long  Term Goals Exhibits compliance with exercise, home and travel O2 prescription;Verbalizes importance of monitoring  SPO2 with pulse oximeter and return demonstration;Maintenance of O2 saturations>88%;Exhibits proper breathing techniques, such as purse lipped breathing or other method taught during program session;Compliance with respiratory medication      Initial Exercise Prescription:     Initial Exercise Prescription - 05/22/17 1000      Date of Initial Exercise RX and Referring Provider   Date 05/22/17   Referring Provider Dr. Sherene Sires     Oxygen   Oxygen Continuous   Liters 3     NuStep   Level 2   Minutes 17   METs 1.5     Arm Ergometer   Level 2   Minutes 17     Track   Laps 10   Minutes 17     Prescription Details   Frequency (times per week) 2   Duration Progress to 45 minutes of aerobic exercise without signs/symptoms of physical distress     Intensity   THRR 40-80% of Max Heartrate 60-120   Ratings of Perceived Exertion 11-13   Perceived Dyspnea 0-4     Progression   Progression Continue progressive overload as per policy without signs/symptoms or physical distress.     Resistance Training   Training Prescription Yes   Weight blue bands   Reps 10-15      Perform Capillary Blood Glucose checks as needed.  Exercise Prescription Changes:     Exercise Prescription Changes    Row Name 05/28/17 1500 06/02/17 1500           Response to Exercise   Blood Pressure (Admit) 144/64 138/70      Blood Pressure (Exercise) 160/76 168/86      Blood Pressure (Exit) 140/72 140/70      Heart Rate (Admit) 96 bpm 91 bpm      Heart Rate (Exercise) 116 bpm 116 bpm      Heart Rate (Exit) 104 bpm 98 bpm      Oxygen Saturation (Admit) 90 % 91 %      Oxygen Saturation (Exercise) 90 % 93 %      Oxygen Saturation (Exit) 92 %  96 %      Rating of Perceived Exertion (Exercise) 13 13      Perceived Dyspnea (Exercise) 2 2      Duration Continue with 45 min of aerobic exercise without signs/symptoms of physical distress. Continue with 45 min of aerobic exercise without signs/symptoms of  physical distress.      Intensity THRR unchanged THRR unchanged        Progression   Progression Continue to progress workloads to maintain intensity without signs/symptoms of physical distress. Continue to progress workloads to maintain intensity without signs/symptoms of physical distress.        Resistance Training   Training Prescription Yes Yes      Weight blue bands blue bands      Reps 10-15 10-15        Oxygen   Oxygen Continuous Continuous      Liters 3 3        NuStep   Level 2 3      Minutes 17 17      METs 1.7 1.9        Arm Ergometer   Level  - 2      Minutes  - 17        Track   Laps 9 7      Minutes 17 17         Exercise Comments:   Exercise Goals and Review:   Exercise Goals Re-Evaluation :     Exercise Goals Re-Evaluation    Row Name 05/29/17 1119             Exercise Goal Re-Evaluation   Exercise Goals Review Increase Strenth and Stamina;Increase Physical Activity       Comments Patient has only attended one exercise session. Will cont. to monitor and progress as able.        Expected Outcomes Through exercising at rehab and at home, patient will increase physicial capacity, strength, and stamina.           Discharge Exercise Prescription (Final Exercise Prescription Changes):     Exercise Prescription Changes - 06/02/17 1500      Response to Exercise   Blood Pressure (Admit) 138/70   Blood Pressure (Exercise) 168/86   Blood Pressure (Exit) 140/70   Heart Rate (Admit) 91 bpm   Heart Rate (Exercise) 116 bpm   Heart Rate (Exit) 98 bpm   Oxygen Saturation (Admit) 91 %   Oxygen Saturation (Exercise) 93 %   Oxygen Saturation (Exit) 96 %   Rating of Perceived Exertion (Exercise) 13   Perceived Dyspnea (Exercise) 2   Duration Continue with 45 min of aerobic exercise without signs/symptoms of physical distress.   Intensity THRR unchanged     Progression   Progression Continue to progress workloads to maintain intensity without  signs/symptoms of physical distress.     Resistance Training   Training Prescription Yes   Weight blue bands   Reps 10-15     Oxygen   Oxygen Continuous   Liters 3     NuStep   Level 3   Minutes 17   METs 1.9     Arm Ergometer   Level 2   Minutes 17     Track   Laps 7   Minutes 17      Nutrition:  Target Goals: Understanding of nutrition guidelines, daily intake of sodium 1500mg , cholesterol 200mg , calories 30% from fat and 7% or less from saturated fats, daily to have 5 or more  servings of fruits and vegetables.  Biometrics:     Pre Biometrics - 05/11/17 1107      Pre Biometrics   Grip Strength 33 kg       Nutrition Therapy Plan and Nutrition Goals:     Nutrition Therapy & Goals - 05/21/17 0829      Nutrition Therapy   Diet General, Healthful     Personal Nutrition Goals   Nutrition Goal Identify food quantities necessary to achieve wt loss of  -2# per week to a goal wt loss of 2.7-10.9 kg (6-24 lb) at graduation from pulmonary rehab.     Intervention Plan   Intervention Prescribe, educate and counsel regarding individualized specific dietary modifications aiming towards targeted core components such as weight, hypertension, lipid management, diabetes, heart failure and other comorbidities.   Expected Outcomes Short Term Goal: Understand basic principles of dietary content, such as calories, fat, sodium, cholesterol and nutrients.;Long Term Goal: Adherence to prescribed nutrition plan.      Nutrition Discharge: Rate Your Plate Scores:     Nutrition Assessments - 05/21/17 0826      Rate Your Plate Scores   Pre Score 54      Nutrition Goals Re-Evaluation:   Nutrition Goals Discharge (Final Nutrition Goals Re-Evaluation):   Psychosocial: Target Goals: Acknowledge presence or absence of significant depression and/or stress, maximize coping skills, provide positive support system. Participant is able to verbalize types and ability to use  techniques and skills needed for reducing stress and depression.  Initial Review & Psychosocial Screening:     Initial Psych Review & Screening - 05/11/17 1113      Initial Review   Current issues with History of Depression     Family Dynamics   Good Support System? Yes     Barriers   Psychosocial barriers to participate in program The patient should benefit from training in stress management and relaxation.     Screening Interventions   Interventions Encouraged to exercise      Quality of Life Scores:   PHQ-9: Recent Review Flowsheet Data    Depression screen [facility] 2/9 05/11/2017 06/20/2016 04/12/2014   Decreased Interest 3 0 0   Down, Depressed, Hopeless 2 0 0   PHQ - 2 Score 5 0 0   Altered sleeping 3 - -   Tired, decreased energy 3 - -   Change in appetite 3 - -   Feeling bad or failure about yourself  0 - -   Trouble concentrating 0 - -   Moving slowly or fidgety/restless 0 - -   Suicidal thoughts 0 - -   PHQ-9 Score 14 - -   Difficult doing work/chores Somewhat difficult - -     Interpretation of Total Score  Total Score Depression Severity:  1-4 = Minimal depression, 5-9 = Mild depression, 10-14 = Moderate depression, 15-19 = Moderately severe depression, 20-27 = Severe depression   Psychosocial Evaluation and Intervention:     Psychosocial Evaluation - 05/11/17 1116      Psychosocial Evaluation & Interventions   Interventions Physician referral;Therapist referral;Encouraged to exercise with the program and follow exercise prescription   Continue Psychosocial Services  Follow up required by counselor      Psychosocial Re-Evaluation:   Psychosocial Discharge (Final Psychosocial Re-Evaluation):   Education: Education Goals: Education classes will be provided on a weekly basis, covering required topics. Participant will state understanding/return demonstration of topics presented.  Learning Barriers/Preferences:     Learning Barriers/Preferences -  05/11/17 1102  Learning Barriers/Preferences   Learning Barriers None   Learning Preferences Video;Pictoral;Audio;Computer/Internet      Education Topics: Risk Factor Reduction:  -Group instruction that is supported by a PowerPoint presentation. Instructor discusses the definition of a risk factor, different risk factors for pulmonary disease, and how the heart and lungs work together.     PULMONARY REHAB OTHER RESPIRATORY from 05/28/2017 in [facility] CARDIAC REHAB  Date  05/28/17  Educator  ep  Instruction Review Code  2- meets goals/outcomes      Nutrition for Pulmonary Patient:  -Group instruction provided by PowerPoint slides, verbal discussion, and written materials to support subject matter. The instructor gives an explanation and review of healthy diet recommendations, which includes a discussion on weight management, recommendations for fruit and vegetable consumption, as well as protein, fluid, caffeine, fiber, sodium, sugar, and alcohol. Tips for eating when patients are short of breath are discussed.   Pursed Lip Breathing:  -Group instruction that is supported by demonstration and informational handouts. Instructor discusses the benefits of pursed lip and diaphragmatic breathing and detailed demonstration on how to preform both.     Oxygen Safety:  -Group instruction provided by PowerPoint, verbal discussion, and written material to support subject matter. There is an overview of "What is Oxygen" and "Why do we need it".  Instructor also reviews how to create a safe environment for oxygen use, the importance of using oxygen as prescribed, and the risks of noncompliance. There is a brief discussion on traveling with oxygen and resources the patient may utilize.   Oxygen Equipment:  -Group instruction provided by [facility] utilizing handouts, written materials, and equipment demonstrations.   Signs and Symptoms:  -Group instruction provided  by written material and verbal discussion to support subject matter. Warning signs and symptoms of infection, stroke, and heart attack are reviewed and when to call the physician/911 reinforced. Tips for preventing the spread of infection discussed.   Advanced Directives:  -Group instruction provided by verbal instruction and written material to support subject matter. Instructor reviews Advanced Directive laws and proper instruction for filling out document.   Pulmonary Video:  -Group video education that reviews the importance of medication and oxygen compliance, exercise, good nutrition, pulmonary hygiene, and pursed lip and diaphragmatic breathing for the pulmonary patient.   Exercise for the Pulmonary Patient:  -Group instruction that is supported by a PowerPoint presentation. Instructor discusses benefits of exercise, core components of exercise, frequency, duration, and intensity of an exercise routine, importance of utilizing pulse oximetry during exercise, safety while exercising, and options of places to exercise outside of rehab.     Pulmonary Medications:  -Verbally interactive group education provided by instructor with focus on inhaled medications and proper administration.   Anatomy and Physiology of the Respiratory System and Intimacy:  -Group instruction provided by PowerPoint, verbal discussion, and written material to support subject matter. Instructor reviews respiratory cycle and anatomical components of the respiratory system and their functions. Instructor also reviews differences in obstructive and restrictive respiratory diseases with examples of each. Intimacy, Sex, and Sexuality differences are reviewed with a discussion on how relationships can change when diagnosed with pulmonary disease. Common sexual concerns are reviewed.   MD DAY -A group question and answer session with a medical doctor that allows participants to ask questions that relate to their pulmonary  disease state.   OTHER EDUCATION -Group or individual verbal, written, or video instructions that support the educational goals of the pulmonary rehab program.  Knowledge Questionnaire Score:     Knowledge Questionnaire Score - 05/13/17 0941      Knowledge Questionnaire Score   Pre Score 8/13      Core Components/Risk Factors/Patient Goals at Admission:     Personal Goals and Risk Factors at Admission - 05/11/17 1108      Core Components/Risk Factors/Patient Goals on Admission    Weight Management Yes   Intervention Weight Management: Develop a combined nutrition and exercise program designed to reach desired caloric intake, while maintaining appropriate intake of nutrient and fiber, sodium and fats, and appropriate energy expenditure required for the weight goal.   Admit Weight 213 lb 3 oz (96.7 kg)   Goal Weight: Short Term 202 lb 13.2 oz (92 kg)   Goal Weight: Long Term 203 lb (92.1 kg)   Expected Outcomes Short Term: Continue to assess and modify interventions until short term weight is achieved   Improve shortness of breath with ADL's Yes   Intervention Provide education, individualized exercise plan and daily activity instruction to help decrease symptoms of SOB with activities of daily living.   Expected Outcomes Short Term: Achieves a reduction of symptoms when performing activities of daily living.   Develop more efficient breathing techniques such as purse lipped breathing and diaphragmatic breathing; and practicing self-pacing with activity Yes   Intervention Provide education, demonstration and support about specific breathing techniuqes utilized for more efficient breathing. Include techniques such as pursed lipped breathing, diaphragmatic breathing and self-pacing activity.   Expected Outcomes Short Term: Participant will be able to demonstrate and use breathing techniques as needed throughout daily activities.   Stress Yes   Intervention Offer individual and/or  small group education and counseling on adjustment to heart disease, stress management and health-related lifestyle change. Teach and support self-help strategies.   Expected Outcomes Short Term: Participant demonstrates changes in health-related behavior, relaxation and other stress management skills, ability to obtain effective social support, and compliance with psychotropic medications if prescribed.      Core Components/Risk Factors/Patient Goals Review:      Goals and Risk Factor Review    Row Name 06/02/17 1718             Core Components/Risk Factors/Patient Goals Review   Review patient has only attended 2 sessions since admission and too soon to evaluate progress towards goals       Expected Outcomes see admission expected outcomes          Core Components/Risk Factors/Patient Goals at Discharge (Final Review):      Goals and Risk Factor Review - 06/02/17 1718      Core Components/Risk Factors/Patient Goals Review   Review patient has only attended 2 sessions since admission and too soon to evaluate progress towards goals   Expected Outcomes see admission expected outcomes      ITP Comments:   Comments: ITP REVIEW Too soon to evaluate progress toward pulmonary rehab goals after completing only 2 sessions.Wil re-evaluate over the next 30 days. Recommend continued exercise, life style modification, education, and utilization of breathing techniques to increase stamina and strength and decrease shortness of breath with exertion.

## 2017-06-04 ENCOUNTER — Encounter (HOSPITAL_COMMUNITY)
Admission: RE | Admit: 2017-06-04 | Discharge: 2017-06-04 | Disposition: A | Discharge status: Home / Self Care | Payer: Medicare Other | Source: Ambulatory Visit | Attending: Internal Medicine | Admitting: Internal Medicine

## 2017-06-04 VITALS — Wt 211.0 lb

## 2017-06-04 DIAGNOSIS — M1711 Unilateral primary osteoarthritis, right knee: Secondary | ICD-10-CM | POA: Insufficient documentation

## 2017-06-04 DIAGNOSIS — Z7982 Long term (current) use of aspirin: Secondary | ICD-10-CM | POA: Diagnosis not present

## 2017-06-04 DIAGNOSIS — Z7951 Long term (current) use of inhaled steroids: Secondary | ICD-10-CM | POA: Diagnosis not present

## 2017-06-04 DIAGNOSIS — J449 Chronic obstructive pulmonary disease, unspecified: Secondary | ICD-10-CM | POA: Diagnosis not present

## 2017-06-04 DIAGNOSIS — Z79899 Other long term (current) drug therapy: Secondary | ICD-10-CM | POA: Diagnosis not present

## 2017-06-04 DIAGNOSIS — Z87891 Personal history of nicotine dependence: Secondary | ICD-10-CM | POA: Diagnosis not present

## 2017-06-04 DIAGNOSIS — E039 Hypothyroidism, unspecified: Secondary | ICD-10-CM | POA: Insufficient documentation

## 2017-06-04 DIAGNOSIS — K219 Gastro-esophageal reflux disease without esophagitis: Secondary | ICD-10-CM | POA: Diagnosis not present

## 2017-06-04 DIAGNOSIS — J8410 Pulmonary fibrosis, unspecified: Secondary | ICD-10-CM | POA: Diagnosis not present

## 2017-06-04 DIAGNOSIS — L4050 Arthropathic psoriasis, unspecified: Secondary | ICD-10-CM | POA: Diagnosis not present

## 2017-06-04 DIAGNOSIS — I252 Old myocardial infarction: Secondary | ICD-10-CM | POA: Insufficient documentation

## 2017-06-04 DIAGNOSIS — J9610 Chronic respiratory failure, unspecified whether with hypoxia or hypercapnia: Secondary | ICD-10-CM | POA: Diagnosis not present

## 2017-06-04 DIAGNOSIS — I10 Essential (primary) hypertension: Secondary | ICD-10-CM | POA: Diagnosis not present

## 2017-06-04 NOTE — Progress Notes (Signed)
Daily Session Note  Patient Details  Name: [name] MRN: [id number] Date of Birth: August 02, 1946 Referring Provider:     Pulmonary Rehab Walk Test from 05/21/2017 in [place]  Referring Provider  Dr. Melvyn Novas      Encounter Date: 06/04/2017  Check In:     Session Check In - 06/04/17 1341      Check-In   Location [facility]   Staff Present Su Hilt, MS, ACSM RCEP, Exercise Physiologist;Lisa Ysidro Evert, RN;Portia Rollene Rotunda, RN, BSN   Supervising physician immediately available to respond to emergencies [facility] immediately available   Physician(s) Dr. Jonnie Finner   Medication changes reported     No   Fall or balance concerns reported    No   Tobacco Cessation No Change   Warm-up and Cool-down Performed as group-led instruction   Resistance Training Performed Yes   VAD Patient? No     Pain Assessment   Currently in Pain? No/denies   Multiple Pain Sites No      Capillary Blood Glucose: No results found for this or any previous visit (from the past 24 hour(s)).      Exercise Prescription Changes - 06/04/17 1500      Response to Exercise   Blood Pressure (Admit) 164/70   Blood Pressure (Exercise) 150/78   Blood Pressure (Exit) 136/64   Heart Rate (Admit) 88 bpm   Heart Rate (Exercise) 121 bpm   Heart Rate (Exit) 97 bpm   Oxygen Saturation (Admit) 87 %   Oxygen Saturation (Exercise) 89 %   Oxygen Saturation (Exit) 96 %   Rating of Perceived Exertion (Exercise) 17   Perceived Dyspnea (Exercise) 3   Duration Continue with 45 min of aerobic exercise without signs/symptoms of physical distress.   Intensity THRR unchanged     Progression   Progression Continue to progress workloads to maintain intensity without signs/symptoms of physical distress.     Resistance Training   Training Prescription Yes   Weight blue bands   Reps 10-15     Oxygen   Oxygen Continuous   Liters 3     Arm Ergometer   Level 2   Minutes 17     Track   Laps 9   Minutes 17      History  Smoking Status  . Former Smoker  . Packs/day: 1.50  . Years: 58.50  . Types: Cigarettes  . Quit date: 03/03/2005  Smokeless Tobacco  . Never Used    Goals Met:  Exercise tolerated well No report of cardiac concerns or symptoms Strength training completed today  Goals Unmet:  Not Applicable  Comments: Service time is from 1:30p to 3:15p    Dr. Rush Farmer is Medical Director for Pulmonary Rehab at [facility].

## 2017-06-09 ENCOUNTER — Encounter (HOSPITAL_COMMUNITY)
Admission: RE | Admit: 2017-06-09 | Discharge: 2017-06-09 | Disposition: A | Discharge status: Home / Self Care | Payer: Medicare Other | Source: Ambulatory Visit | Attending: Internal Medicine | Admitting: Internal Medicine

## 2017-06-09 VITALS — Wt 211.4 lb

## 2017-06-09 DIAGNOSIS — J449 Chronic obstructive pulmonary disease, unspecified: Secondary | ICD-10-CM | POA: Diagnosis not present

## 2017-06-09 DIAGNOSIS — J8410 Pulmonary fibrosis, unspecified: Secondary | ICD-10-CM

## 2017-06-09 DIAGNOSIS — Z7982 Long term (current) use of aspirin: Secondary | ICD-10-CM | POA: Diagnosis not present

## 2017-06-09 DIAGNOSIS — M0589 Other rheumatoid arthritis with rheumatoid factor of multiple sites: Secondary | ICD-10-CM | POA: Diagnosis not present

## 2017-06-09 DIAGNOSIS — Z7951 Long term (current) use of inhaled steroids: Secondary | ICD-10-CM | POA: Diagnosis not present

## 2017-06-09 DIAGNOSIS — Z79899 Other long term (current) drug therapy: Secondary | ICD-10-CM | POA: Diagnosis not present

## 2017-06-09 DIAGNOSIS — J9610 Chronic respiratory failure, unspecified whether with hypoxia or hypercapnia: Secondary | ICD-10-CM | POA: Diagnosis not present

## 2017-06-09 NOTE — Progress Notes (Signed)
Daily Session Note  Patient Details  Name: [name] MRN: [id number] Date of Birth: 1946-10-03 Referring Provider:     Pulmonary Rehab Walk Test from 05/21/2017 in [place]  Referring Provider  Dr. Melvyn Novas      Encounter Date: 06/09/2017  Check In:     Session Check In - 06/09/17 1330      Check-In   [facility]   Staff Present Su Hilt, MS, ACSM RCEP, Exercise Physiologist;Rochele Lueck Ysidro Evert, RN;Portia Rollene Rotunda, Therapist, sports, BSN;Ramon Dredge, RN, [facility]   Supervising physician immediately available to respond to emergencies [facility] immediately available   Physician(s) Dr. Allyson Sabal   Medication changes reported     No   Fall or balance concerns reported    No   Tobacco Cessation No Change   Warm-up and Cool-down Performed as group-led instruction   Resistance Training Performed Yes   VAD Patient? No     Pain Assessment   Currently in Pain? No/denies   Multiple Pain Sites No      Capillary Blood Glucose: No results found for this or any previous visit (from the past 24 hour(s)).      Exercise Prescription Changes - 06/09/17 1500      Response to Exercise   Blood Pressure (Admit) 138/60   Blood Pressure (Exercise) 140/64   Blood Pressure (Exit) 124/62   Heart Rate (Admit) 90 bpm   Heart Rate (Exercise) 127 bpm   Heart Rate (Exit) 99 bpm   Oxygen Saturation (Admit) 98 %   Oxygen Saturation (Exercise) 92 %   Oxygen Saturation (Exit) 99 %   Rating of Perceived Exertion (Exercise) 13   Perceived Dyspnea (Exercise) 1   Duration Continue with 45 min of aerobic exercise without signs/symptoms of physical distress.   Intensity THRR unchanged     Progression   Progression Continue to progress workloads to maintain intensity without signs/symptoms of physical distress.     Resistance Training   Training Prescription Yes   Weight blue bands   Reps 10-15   Time 10 Minutes     Oxygen   Oxygen Continuous   Liters 3     NuStep   Level 4   Minutes 17   METs 2     Arm Ergometer   Level 3   Minutes 17     Track   Laps 9   Minutes 17      History  Smoking Status  . Former Smoker  . Packs/day: 1.50  . Years: 58.50  . Types: Cigarettes  . Quit date: 03/03/2005  Smokeless Tobacco  . Never Used    Goals Met:  Exercise tolerated well No report of cardiac concerns or symptoms Strength training completed today  Goals Unmet:  Not Applicable  Comments: Service time is from 1330 to 1510    Dr. Rush Farmer is Medical Director for Pulmonary Rehab at [facility].

## 2017-06-11 ENCOUNTER — Encounter (HOSPITAL_COMMUNITY): Payer: Medicare Other

## 2017-06-11 ENCOUNTER — Other Ambulatory Visit: Payer: Self-pay | Admitting: Internal Medicine

## 2017-06-11 DIAGNOSIS — H40013 Open angle with borderline findings, low risk, bilateral: Secondary | ICD-10-CM | POA: Diagnosis not present

## 2017-06-11 DIAGNOSIS — H35033 Hypertensive retinopathy, bilateral: Secondary | ICD-10-CM | POA: Diagnosis not present

## 2017-06-11 DIAGNOSIS — H35362 Drusen (degenerative) of macula, left eye: Secondary | ICD-10-CM | POA: Diagnosis not present

## 2017-06-11 DIAGNOSIS — Z961 Presence of intraocular lens: Secondary | ICD-10-CM | POA: Diagnosis not present

## 2017-06-11 LAB — HM DIABETES EYE EXAM

## 2017-06-11 MED ORDER — CITALOPRAM HYDROBROMIDE 40 MG PO TABS: 40.0000 mg | ORAL_TABLET | Freq: Every morning | ORAL | 0 refills | Status: DC

## 2017-06-12 ENCOUNTER — Telehealth: Payer: Self-pay | Admitting: Internal Medicine

## 2017-06-12 NOTE — Telephone Encounter (Signed)
Pt must be seen for a OV before we can refill medication.  Please call and schedule.

## 2017-06-12 NOTE — Telephone Encounter (Signed)
Pt needs refill on tramadol 50  Mg #360 for 90 day supply send to [facility]

## 2017-06-12 NOTE — Telephone Encounter (Signed)
lmom for pt to call back

## 2017-06-15 NOTE — Telephone Encounter (Signed)
Pt will call back to sch.

## 2017-06-16 ENCOUNTER — Encounter (HOSPITAL_COMMUNITY)
Admission: RE | Admit: 2017-06-16 | Discharge: 2017-06-16 | Disposition: A | Discharge status: Home / Self Care | Payer: Medicare Other | Source: Ambulatory Visit | Attending: Internal Medicine | Admitting: Internal Medicine

## 2017-06-16 VITALS — Wt 209.2 lb

## 2017-06-16 DIAGNOSIS — Z79899 Other long term (current) drug therapy: Secondary | ICD-10-CM | POA: Diagnosis not present

## 2017-06-16 DIAGNOSIS — Z7982 Long term (current) use of aspirin: Secondary | ICD-10-CM | POA: Diagnosis not present

## 2017-06-16 DIAGNOSIS — J8410 Pulmonary fibrosis, unspecified: Secondary | ICD-10-CM

## 2017-06-16 DIAGNOSIS — J449 Chronic obstructive pulmonary disease, unspecified: Secondary | ICD-10-CM | POA: Diagnosis not present

## 2017-06-16 DIAGNOSIS — J9610 Chronic respiratory failure, unspecified whether with hypoxia or hypercapnia: Secondary | ICD-10-CM | POA: Diagnosis not present

## 2017-06-16 DIAGNOSIS — Z7951 Long term (current) use of inhaled steroids: Secondary | ICD-10-CM | POA: Diagnosis not present

## 2017-06-16 NOTE — Progress Notes (Signed)
Daily Session Note  Patient Details  Name: [name] MRN: [id number] Date of Birth: 07/25/1946 Referring Provider:     Pulmonary Rehab Walk Test from 05/21/2017 in [facility]  Referring Provider  Dr. Wert      Encounter Date: 06/16/2017  Check In:   Capillary Blood Glucose: No results found for this or any previous visit (from the past 24 hour(s)).      Exercise Prescription Changes - 06/16/17 1500      Response to Exercise   Blood Pressure (Admit) 138/70   Blood Pressure (Exercise) 168/74   Blood Pressure (Exit) 138/76   Heart Rate (Admit) 93 bpm   Heart Rate (Exercise) 127 bpm   Heart Rate (Exit) 108 bpm   Oxygen Saturation (Admit) 93 %   Oxygen Saturation (Exercise) 88 %   Oxygen Saturation (Exit) 97 %   Rating of Perceived Exertion (Exercise) 12   Perceived Dyspnea (Exercise) 2   Duration Continue with 45 min of aerobic exercise without signs/symptoms of physical distress.   Intensity THRR unchanged     Progression   Progression Continue to progress workloads to maintain intensity without signs/symptoms of physical distress.     Resistance Training   Training Prescription Yes   Weight blue bands   Reps 10-15   Time 10 Minutes     Oxygen   Oxygen Continuous   Liters 3     NuStep   Level 4   Minutes 17   METs 1.6     Arm Ergometer   Level 2   Minutes 17     Track   Laps 9   Minutes 17      History  Smoking Status  . Former Smoker  . Packs/day: 1.50  . Years: 58.50  . Types: Cigarettes  . Quit date: 03/03/2005  Smokeless Tobacco  . Never Used    Goals Met:  Exercise tolerated well No report of cardiac concerns or symptoms Strength training completed today  Goals Unmet:  Not Applicable  Comments: Service time is from 1330 to 1500    Dr. Wesam G. Yacoub is Medical Director for Pulmonary Rehab at [facility]. 

## 2017-06-18 ENCOUNTER — Encounter (HOSPITAL_COMMUNITY)
Admission: RE | Admit: 2017-06-18 | Discharge: 2017-06-18 | Disposition: A | Discharge status: Home / Self Care | Payer: Medicare Other | Source: Ambulatory Visit | Attending: Internal Medicine | Admitting: Internal Medicine

## 2017-06-18 VITALS — Wt 209.7 lb

## 2017-06-18 DIAGNOSIS — J9610 Chronic respiratory failure, unspecified whether with hypoxia or hypercapnia: Secondary | ICD-10-CM | POA: Diagnosis not present

## 2017-06-18 DIAGNOSIS — J8410 Pulmonary fibrosis, unspecified: Secondary | ICD-10-CM | POA: Diagnosis not present

## 2017-06-18 DIAGNOSIS — Z7982 Long term (current) use of aspirin: Secondary | ICD-10-CM | POA: Diagnosis not present

## 2017-06-18 DIAGNOSIS — Z79899 Other long term (current) drug therapy: Secondary | ICD-10-CM | POA: Diagnosis not present

## 2017-06-18 DIAGNOSIS — Z7951 Long term (current) use of inhaled steroids: Secondary | ICD-10-CM | POA: Diagnosis not present

## 2017-06-18 DIAGNOSIS — J449 Chronic obstructive pulmonary disease, unspecified: Secondary | ICD-10-CM | POA: Diagnosis not present

## 2017-06-18 NOTE — Progress Notes (Signed)
Daily Session Note  Patient Details  Name: [name] MRN: [id number] Date of Birth: [birth year] Referring Provider:     Pulmonary Rehab Walk Test from 05/21/2017 in [place]  Referring Provider  Dr. Melvyn Novas      Encounter Date: 06/18/2017  Check In:     Session Check In - 06/18/17 1506      Check-In   [facility]   Staff Present Su Hilt, MS, ACSM RCEP, Exercise Physiologist;Dollie Bressi Ysidro Evert, RN;Portia Rollene Rotunda, RN, BSN   Supervising physician immediately available to respond to emergencies [facility] immediately available   Physician(s) Dr. Wendee Beavers   Medication changes reported     No   Fall or balance concerns reported    No   Tobacco Cessation No Change   Warm-up and Cool-down Performed as group-led instruction   Resistance Training Performed Yes   VAD Patient? No     Pain Assessment   Currently in Pain? No/denies   Multiple Pain Sites No      Capillary Blood Glucose: No results found for this or any previous visit (from the past 24 hour(s)).      Exercise Prescription Changes - 06/18/17 1500      Response to Exercise   Blood Pressure (Admit) 134/54   Blood Pressure (Exercise) 146/60   Blood Pressure (Exit) 118/60   Heart Rate (Admit) 89 bpm   Heart Rate (Exercise) 128 bpm   Heart Rate (Exit) 108 bpm   Oxygen Saturation (Admit) 89 %   Oxygen Saturation (Exercise) 88 %   Oxygen Saturation (Exit) 94 %   Rating of Perceived Exertion (Exercise) 12   Perceived Dyspnea (Exercise) 1   Duration Continue with 45 min of aerobic exercise without signs/symptoms of physical distress.   Intensity THRR unchanged     Progression   Progression Continue to progress workloads to maintain intensity without signs/symptoms of physical distress.     Resistance Training   Training Prescription Yes   Weight blue bands   Reps 10-15   Time 10 Minutes     Oxygen   Oxygen Continuous   Liters 3     NuStep   Level 4   Minutes 17   METs 2.1     Arm Ergometer   Level 3   Minutes 17     Track   Laps 11   Minutes 17      History  Smoking Status  . Former Smoker  . Packs/day: 1.50  . Years: 58.50  . Types: Cigarettes  . Quit date: 03/03/2005  Smokeless Tobacco  . Never Used    Goals Met:  Exercise tolerated well No report of cardiac concerns or symptoms Strength training completed today  Goals Unmet:  Not Applicable  Comments: Service time is from 1330 to 1500    Dr. Rush Farmer is Medical Director for Pulmonary Rehab at [facility].

## 2017-06-23 ENCOUNTER — Encounter (HOSPITAL_COMMUNITY)
Admission: RE | Admit: 2017-06-23 | Discharge: 2017-06-23 | Disposition: A | Discharge status: Home / Self Care | Payer: Medicare Other | Source: Ambulatory Visit | Attending: Internal Medicine | Admitting: Internal Medicine

## 2017-06-23 ENCOUNTER — Encounter (HOSPITAL_COMMUNITY): Payer: Medicare Other

## 2017-06-23 VITALS — Wt 206.8 lb

## 2017-06-23 DIAGNOSIS — Z7951 Long term (current) use of inhaled steroids: Secondary | ICD-10-CM | POA: Diagnosis not present

## 2017-06-23 DIAGNOSIS — Z79899 Other long term (current) drug therapy: Secondary | ICD-10-CM | POA: Diagnosis not present

## 2017-06-23 DIAGNOSIS — J8410 Pulmonary fibrosis, unspecified: Secondary | ICD-10-CM | POA: Diagnosis not present

## 2017-06-23 DIAGNOSIS — Z7982 Long term (current) use of aspirin: Secondary | ICD-10-CM | POA: Diagnosis not present

## 2017-06-23 DIAGNOSIS — J449 Chronic obstructive pulmonary disease, unspecified: Secondary | ICD-10-CM | POA: Diagnosis not present

## 2017-06-23 DIAGNOSIS — J9610 Chronic respiratory failure, unspecified whether with hypoxia or hypercapnia: Secondary | ICD-10-CM | POA: Diagnosis not present

## 2017-06-23 NOTE — Progress Notes (Signed)
Daily Session Note  Patient Details  Name: [name] MRN: [id number] Date of Birth: 1945-11-21 Referring Provider:     Pulmonary Rehab Walk Test from 05/21/2017 in [place]  Referring Provider  Dr. Melvyn Novas      Encounter Date: 06/23/2017  Check In:     Session Check In - 06/23/17 1458      Check-In   Location [facility]   Staff Present Su Hilt, MS, ACSM RCEP, Exercise Physiologist;Shavon Ashmore Ysidro Evert, RN;Portia Rollene Rotunda, RN, BSN   Supervising physician immediately available to respond to emergencies [facility] immediately available   Physician(s) Dr. Wendee Beavers   Medication changes reported     No   Fall or balance concerns reported    No   Tobacco Cessation No Change   Warm-up and Cool-down Performed as group-led instruction   Resistance Training Performed Yes   VAD Patient? No     Pain Assessment   Currently in Pain? No/denies   Multiple Pain Sites No      Capillary Blood Glucose: No results found for this or any previous visit (from the past 24 hour(s)).      Exercise Prescription Changes - 06/23/17 1500      Response to Exercise   Blood Pressure (Admit) 170/74   Blood Pressure (Exercise) 148/76   Blood Pressure (Exit) 124/72   Heart Rate (Admit) 101 bpm   Heart Rate (Exercise) 120 bpm   Heart Rate (Exit) 99 bpm   Oxygen Saturation (Admit) 94 %   Oxygen Saturation (Exercise) 91 %   Oxygen Saturation (Exit) 98 %   Rating of Perceived Exertion (Exercise) 13   Perceived Dyspnea (Exercise) 1   Duration Continue with 45 min of aerobic exercise without signs/symptoms of physical distress.   Intensity THRR unchanged     Progression   Progression Continue to progress workloads to maintain intensity without signs/symptoms of physical distress.     Resistance Training   Training Prescription Yes   Weight blue bands   Reps 10-15   Time 10 Minutes     Oxygen   Oxygen Continuous   Liters 3     Arm Ergometer    Level 3   Minutes 17     Track   Laps 11   Minutes 17      History  Smoking Status  . Former Smoker  . Packs/day: 1.50  . Years: 58.50  . Types: Cigarettes  . Quit date: 03/03/2005  Smokeless Tobacco  . Never Used    Goals Met:  Exercise tolerated well No report of cardiac concerns or symptoms Strength training completed today  Goals Unmet:  Not Applicable  Comments: Service time is from 1330 to 1450    Dr. Rush Farmer is Medical Director for Pulmonary Rehab at [facility].

## 2017-06-25 ENCOUNTER — Encounter (HOSPITAL_COMMUNITY)
Admission: RE | Admit: 2017-06-25 | Discharge: 2017-06-25 | Disposition: A | Discharge status: Home / Self Care | Payer: Medicare Other | Source: Ambulatory Visit | Attending: Internal Medicine | Admitting: Internal Medicine

## 2017-06-25 VITALS — Wt 207.9 lb

## 2017-06-25 DIAGNOSIS — Z7951 Long term (current) use of inhaled steroids: Secondary | ICD-10-CM | POA: Diagnosis not present

## 2017-06-25 DIAGNOSIS — Z7982 Long term (current) use of aspirin: Secondary | ICD-10-CM | POA: Diagnosis not present

## 2017-06-25 DIAGNOSIS — Z79899 Other long term (current) drug therapy: Secondary | ICD-10-CM | POA: Diagnosis not present

## 2017-06-25 DIAGNOSIS — J8410 Pulmonary fibrosis, unspecified: Secondary | ICD-10-CM

## 2017-06-25 DIAGNOSIS — J9610 Chronic respiratory failure, unspecified whether with hypoxia or hypercapnia: Secondary | ICD-10-CM | POA: Diagnosis not present

## 2017-06-25 DIAGNOSIS — J449 Chronic obstructive pulmonary disease, unspecified: Secondary | ICD-10-CM | POA: Diagnosis not present

## 2017-06-25 NOTE — Progress Notes (Signed)
Daily Session Note  Patient Details  Name: [name] MRN: [id number] Date of Birth: August 24, 1946 Referring Provider:     Pulmonary Rehab Walk Test from 05/21/2017 in [place]  Referring Provider  Dr. Melvyn Novas      Encounter Date: 06/25/2017  Check In:     Session Check In - 06/25/17 1340      Check-In   Location [facility]   Staff Present Su Hilt, MS, ACSM RCEP, Exercise Physiologist;Lisa Ysidro Evert, RN;Donnesha Karg Rollene Rotunda, RN, BSN   Supervising physician immediately available to respond to emergencies [facility] immediately available   Physician(s) Dr. Clementeen Graham   Medication changes reported     No   Fall or balance concerns reported    No   Tobacco Cessation No Change   Warm-up and Cool-down Performed as group-led instruction   Resistance Training Performed Yes   VAD Patient? No     Pain Assessment   Currently in Pain? No/denies   Multiple Pain Sites No      Capillary Blood Glucose: No results found for this or any previous visit (from the past 24 hour(s)).      Exercise Prescription Changes - 06/25/17 1555      Response to Exercise   Blood Pressure (Admit) 144/64   Blood Pressure (Exercise) 160/60   Blood Pressure (Exit) 144/62   Heart Rate (Admit) 80 bpm   Heart Rate (Exercise) 109 bpm   Heart Rate (Exit) 90 bpm   Oxygen Saturation (Admit) 95 %   Oxygen Saturation (Exercise) 93 %   Oxygen Saturation (Exit) 97 %   Rating of Perceived Exertion (Exercise) 13   Perceived Dyspnea (Exercise) 2   Duration Continue with 45 min of aerobic exercise without signs/symptoms of physical distress.   Intensity THRR unchanged     Progression   Progression Continue to progress workloads to maintain intensity without signs/symptoms of physical distress.     Resistance Training   Training Prescription Yes   Weight blue bands   Reps 10-15   Time 10 Minutes     Oxygen   Oxygen Continuous   Liters 3     NuStep   Level 4   Minutes 17   METs 2.4     Arm Ergometer   Level 4   Minutes 17      History  Smoking Status  . Former Smoker  . Packs/day: 1.50  . Years: 58.50  . Types: Cigarettes  . Quit date: 03/03/2005  Smokeless Tobacco  . Never Used    Goals Met:  Independence with exercise equipment Improved SOB with ADL's Using PLB without cueing & demonstrates good technique Exercise tolerated well Strength training completed today  Goals Unmet:  Not Applicable  Comments: Service time is from 1330 to [place]   Dr. Rush Farmer is Medical Director for Pulmonary Rehab at [facility].

## 2017-06-30 ENCOUNTER — Encounter (HOSPITAL_COMMUNITY)
Admission: RE | Admit: 2017-06-30 | Discharge: 2017-06-30 | Disposition: A | Discharge status: Home / Self Care | Payer: Medicare Other | Source: Ambulatory Visit | Attending: Internal Medicine | Admitting: Internal Medicine

## 2017-07-01 NOTE — Progress Notes (Signed)
Pulmonary Individual Treatment Plan  Patient Details  Name: [name] MRN: [id number] Date of Birth: [birth year] Referring Provider:     Pulmonary Rehab Walk Test from 05/21/2017 in [facility]  Referring Provider  Dr. Melvyn Novas      Initial Encounter Date:    Pulmonary Rehab Walk Test from 05/21/2017 in [facility]  Date  05/22/17  Referring Provider  Dr. Melvyn Novas      Visit Diagnosis: Pulmonary fibrosis ([place])  Patient's Home Medications on Admission:   Current Outpatient Prescriptions:  .  acetaminophen (TYLENOL ARTHRITIS PAIN) 650 MG CR tablet, Take 1,300 mg by mouth every 8 (eight) hours as needed for pain. Per bottle directions as needed, Disp: , Rfl:  .  albuterol (PROAIR HFA) 108 (90 Base) MCG/ACT inhaler, 2 puffs every 4 hours as needed only  if your can't catch your breath, Disp: 1 Inhaler, Rfl: 11 .  aspirin EC 325 MG EC tablet, Take 1 tablet (325 mg total) by mouth daily., Disp: 30 tablet, Rfl: 0 .  atorvastatin (LIPITOR) 80 MG tablet, Take 1 tablet (80 mg total) by mouth daily at 6 PM., Disp: 90 tablet, Rfl: 3 .  budesonide-formoterol (SYMBICORT) 160-4.5 MCG/ACT inhaler, Inhale 2 puffs into the lungs 2 (two) times daily., Disp: 3 Inhaler, Rfl: 6 .  Certolizumab Pegol (CIMZIA [place]), Inject into the skin as directed. Cimzia, Injected by provider as directed, Disp: , Rfl:  .  Cholecalciferol (VITAMIN D3) 2000 UNITS TABS, Take 2,000 Int'l Units by mouth daily., Disp: , Rfl:  .  citalopram (CELEXA) 40 MG tablet, Take 1 tablet (40 mg total) by mouth every morning., Disp: 90 tablet, Rfl: 0 .  famotidine (PEPCID) 20 MG tablet, One at bedtime (Patient not taking: Reported on 05/11/2017), Disp: , Rfl:  .  furosemide (LASIX) 20 MG tablet, Take 1 tablet (20 mg total) by mouth 2 (two) times daily., Disp: 180 tablet, Rfl: 3 .  levothyroxine (SYNTHROID, LEVOTHROID) 150 MCG tablet, TAKE 1 TABLET BY MOUTH  DAILY BEFORE BREAKFAST, Disp: 90  tablet, Rfl: 3 .  losartan (COZAAR) 50 MG tablet, TAKE 1 TABLET BY MOUTH  DAILY, Disp: 90 tablet, Rfl: 3 .  montelukast (SINGULAIR) 10 MG tablet, Take 1 tablet (10 mg total) by mouth at bedtime., Disp: 90 tablet, Rfl: 3 .  NON FORMULARY, 2lpm with sleep and with exertion if needed, Disp: , Rfl:  .  pantoprazole (PROTONIX) 40 MG tablet, TAKE 1 TABLET BY MOUTH  DAILY 30 TO 60 MINUTES  BEFORE FIRST MEAL OF THE  DAY, Disp: 90 tablet, Rfl: 2 .  traMADol (ULTRAM) 50 MG tablet, TAKE 1-2 TABLETS EVERY 6 HOURS AS NEEDED FOR PAIN, Disp: 60 tablet, Rfl: 0 .  triamcinolone cream (KENALOG) 0.1 %, Apply 1 application topically 2 (two) times daily as needed (for psorisis)., Disp: , Rfl:   Past Medical History: Past Medical History:  Diagnosis Date  . Arthritis    OA RIGHT KNEE WITH PAIN  . Barrett esophagus   . Chronic respiratory failure ([place])   . COPD (chronic obstructive pulmonary disease) ([place])   . GERD (gastroesophageal reflux disease)   . History of ARDS 2006  . History of home oxygen therapy    AT NIGHT WHEN SLEEPING 2 L / MIN NASAL CANNULA  . Hypertension   . Hypothyroidism   . NSTEMI (non-ST elevated myocardial infarction) ([place]) 05/31/2015  . Pneumococcal pneumonia ([place]) 2006   HOSPITALIZED AND DEVELOPED ARDS  . Psoriatic arthritis (  [place])   . Pulmonary fibrosis ([place])   . Rheumatic disease   . SOB (shortness of breath) on exertion     Tobacco Use: History  Smoking Status  . Former Smoker  . Packs/day: 1.50  . Years: 58.50  . Types: Cigarettes  . Quit date: 03/03/2005  Smokeless Tobacco  . Never Used    Labs: Recent Review Flowsheet Data    Labs for ITP Cardiac and Pulmonary Rehab Latest Ref Rng & Units 06/05/2015 06/05/2015 06/05/2015 07/12/2015 09/29/2016   Cholestrol <200 mg/dL - - - 117 157   LDLCALC <100 mg/dL - - - 51 61   LDLDIRECT mg/dL - - - - -   HDL >50 mg/dL - - - 51 76   Trlycerides <150 mg/dL - - - 75 101   Hemoglobin A1c 4.8 - 5.6 % - - - - -   PHART 7.350 - 7.450  7.268(L) - - - -   PCO2ART 35.0 - 45.0 mmHg 55.6(H) - - - -   HCO3 20.0 - 24.0 mEq/L 25.5(H) - - - -   TCO2 0 - 100 mmol/L _0 - -   ACIDBASEDEF 0.0 - 2.0 mmol/L 2.0 - - - -   O2SAT % 98.0 - - - -      Capillary Blood Glucose: Lab Results  Component Value Date   GLUCAP 113 (H) 06/06/2015   GLUCAP 107 (H) 06/06/2015   GLUCAP 128 (H) 06/05/2015   GLUCAP 114 (H) 06/05/2015   GLUCAP 109 (H) 06/05/2015     Pulmonary Assessment Scores:     Pulmonary Assessment Scores    Row Name 05/13/17 0941 05/22/17 1008       ADL UCSD   ADL Phase Entry Entry    SOB Score total 65  -      CAT Score   CAT Score 17  Entry  -      mMRC Score   mMRC Score  - 1       Pulmonary Function Assessment:     Pulmonary Function Assessment - 05/11/17 1103      Breath   Bilateral Breath Sounds Clear;Decreased   Shortness of Breath Yes;Limiting activity      Exercise Target Goals:    Exercise Program Goal: Individual exercise prescription set with THRR, safety & activity barriers. Participant demonstrates ability to understand and report RPE using BORG scale, to self-measure pulse accurately, and to acknowledge the importance of the exercise prescription.  Exercise Prescription Goal: Starting with aerobic activity 30 plus minutes a day, 3 days per week for initial exercise prescription. Provide home exercise prescription and guidelines that participant acknowledges understanding prior to discharge.  Activity Barriers & Risk Stratification:   6 Minute Walk:     6 Minute Walk    Row Name 05/22/17 1001 05/22/17 1007       6 Minute Walk   Phase Initial  -    Distance 1010 feet  -    Walk Time 6 minutes  -    # of Rest Breaks 0  -    MPH 1.91  -    METS 2.45  -    RPE 17  -    Perceived Dyspnea  3  -    Symptoms Yes (comment)  -    Comments used wheelchair  -    Resting HR 88 bpm  -    Resting BP 139/76  -    Max Ex. HR 140 bpm  -  Max Ex. BP 197/100  RECHECK:  209/75, 188/94, after seated for 20 minutes 154/84  -      Interval HR   Baseline HR (retired) 88  -    1 Minute HR 116  -    2 Minute HR 118  -    3 Minute HR 126  -    4 Minute HR 136  -    5 Minute HR 140  -    6 Minute HR 136  -    2 Minute Post HR 121  -    Interval Heart Rate? Yes  -      Interval Oxygen   Interval Oxygen? Yes  -    Baseline Oxygen Saturation % 92 %  -    Resting Liters of Oxygen 2 L  -    1 Minute Oxygen Saturation % 87 %  -    1 Minute Liters of Oxygen 2 L  -    2 Minute Oxygen Saturation % 86 %  -    2 Minute Liters of Oxygen 2 L  -    3 Minute Oxygen Saturation % 86 %  -    3 Minute Liters of Oxygen 2 L  -    4 Minute Oxygen Saturation % 91 %  -    4 Minute Liters of Oxygen 2 L 3 L    5 Minute Oxygen Saturation % 93 %  -    5 Minute Liters of Oxygen 2 L 3 L    6 Minute Oxygen Saturation % 90 %  -    6 Minute Liters of Oxygen 2 L 3 L    2 Minute Post Oxygen Saturation % 94 %  -    2 Minute Post Liters of Oxygen 2 L 3 L       Oxygen Initial Assessment:     Oxygen Initial Assessment - 05/22/17 1007      Initial 6 min Walk   Oxygen Used Continuous;E-Tanks   Liters per minute 2   Resting Oxygen Saturation  92 %   Exercise Oxygen Saturation  during 6 min walk 86 %  INCREASED TO 3 LITERS     Program Oxygen Prescription   Program Oxygen Prescription Continuous;E-Tanks   Liters per minute 3      Oxygen Re-Evaluation:     Oxygen Re-Evaluation    Row Name 06/02/17 1717 07/01/17 2113           Program Oxygen Prescription   Program Oxygen Prescription Continuous;E-Tanks Continuous;E-Tanks      Liters per minute 3 3        Home Oxygen   Home Oxygen Device Home Concentrator;Portable Concentrator;E-Tanks Home Concentrator;Portable Concentrator;E-Tanks      Sleep Oxygen Prescription Continuous Continuous      Liters per minute 2 2      Home Exercise Oxygen Prescription Continuous Continuous      Liters per minute 2 3      Home at Rest  Exercise Oxygen Prescription None None      Compliance with Home Oxygen Use Yes Yes        Goals/Expected Outcomes   Short Term Goals To learn and exhibit compliance with exercise, home and travel O2 prescription;To learn and understand importance of monitoring SPO2 with pulse oximeter and demonstrate accurate use of the pulse oximeter.;To Learn and understand importance of maintaining oxygen saturations>88%;To learn and demonstrate proper purse lipped breathing techniques or other breathing techniques.;To learn and demonstrate  proper use of respiratory medications To learn and exhibit compliance with exercise, home and travel O2 prescription;To learn and understand importance of monitoring SPO2 with pulse oximeter and demonstrate accurate use of the pulse oximeter.;To learn and understand importance of maintaining oxygen saturations>88%;To learn and demonstrate proper pursed lip breathing techniques or other breathing techniques.;To learn and demonstrate proper use of respiratory medications      Long  Term Goals Exhibits compliance with exercise, home and travel O2 prescription;Verbalizes importance of monitoring SPO2 with pulse oximeter and return demonstration;Maintenance of O2 saturations>88%;Exhibits proper breathing techniques, such as purse lipped breathing or other method taught during program session;Compliance with respiratory medication Exhibits compliance with exercise, home and travel O2 prescription;Verbalizes importance of monitoring SPO2 with pulse oximeter and return demonstration;Maintenance of O2 saturations>88%;Exhibits proper breathing techniques, such as pursed lip breathing or other method taught during program session;Compliance with respiratory medication      Comments  - goals met         Oxygen Discharge (Final Oxygen Re-Evaluation):     Oxygen Re-Evaluation - 07/01/17 2113      Program Oxygen Prescription   Program Oxygen Prescription Continuous;E-Tanks   Liters  per minute 3     Home Oxygen   Home Oxygen Device Home Concentrator;Portable Concentrator;E-Tanks   Sleep Oxygen Prescription Continuous   Liters per minute 2   Home Exercise Oxygen Prescription Continuous   Liters per minute 3   Home at Rest Exercise Oxygen Prescription None   Compliance with Home Oxygen Use Yes     Goals/Expected Outcomes   Short Term Goals To learn and exhibit compliance with exercise, home and travel O2 prescription;To learn and understand importance of monitoring SPO2 with pulse oximeter and demonstrate accurate use of the pulse oximeter.;To learn and understand importance of maintaining oxygen saturations>88%;To learn and demonstrate proper pursed lip breathing techniques or other breathing techniques.;To learn and demonstrate proper use of respiratory medications   Long  Term Goals Exhibits compliance with exercise, home and travel O2 prescription;Verbalizes importance of monitoring SPO2 with pulse oximeter and return demonstration;Maintenance of O2 saturations>88%;Exhibits proper breathing techniques, such as pursed lip breathing or other method taught during program session;Compliance with respiratory medication   Comments goals met      Initial Exercise Prescription:     Initial Exercise Prescription - 05/22/17 1000      Date of Initial Exercise RX and Referring Provider   Date 05/22/17   Referring Provider Dr. Melvyn Novas     Oxygen   Oxygen Continuous   Liters 3     NuStep   Level 2   Minutes 17   METs 1.5     Arm Ergometer   Level 2   Minutes 17     Track   Laps 10   Minutes 17     Prescription Details   Frequency (times per week) 2   Duration Progress to 45 minutes of aerobic exercise without signs/symptoms of physical distress     Intensity   THRR 40-80% of Max Heartrate 60-120   Ratings of Perceived Exertion 11-13   Perceived Dyspnea 0-4     Progression   Progression Continue progressive overload as per policy without signs/symptoms or  physical distress.     Resistance Training   Training Prescription Yes   Weight blue bands   Reps 10-15      Perform Capillary Blood Glucose checks as needed.  Exercise Prescription Changes:     Exercise Prescription Changes    Row Name  05/28/17 1500 06/02/17 1500 06/04/17 1500 06/09/17 1500 06/16/17 1500     Response to Exercise   Blood Pressure (Admit) 144/64 138/70 164/70 138/60 138/70   Blood Pressure (Exercise) 160/76 168/86 150/78 140/64 168/74   Blood Pressure (Exit) 140/72 140/70 136/64 124/62 138/76   Heart Rate (Admit) 96 bpm 91 bpm 88 bpm 90 bpm 93 bpm   Heart Rate (Exercise) 116 bpm 116 bpm 121 bpm 127 bpm 127 bpm   Heart Rate (Exit) 104 bpm 98 bpm 97 bpm 99 bpm 108 bpm   Oxygen Saturation (Admit) 90 % 91 % 87 % 98 % 93 %   Oxygen Saturation (Exercise) 90 % 93 % 89 % 92 % 88 %   Oxygen Saturation (Exit) 92 % 96 % 96 % 99 % 97 %   Rating of Perceived Exertion (Exercise) _0 Perceived Dyspnea (Exercise) _1 Duration Continue with 45 min of aerobic exercise without signs/symptoms of physical distress. Continue with 45 min of aerobic exercise without signs/symptoms of physical distress. Continue with 45 min of aerobic exercise without signs/symptoms of physical distress. Continue with 45 min of aerobic exercise without signs/symptoms of physical distress. Continue with 45 min of aerobic exercise without signs/symptoms of physical distress.   Intensity _2      Progression   Progression Continue to progress workloads to maintain intensity without signs/symptoms of physical distress. Continue to progress workloads to maintain intensity without signs/symptoms of physical distress. Continue to progress workloads to maintain intensity without signs/symptoms of physical distress. Continue to progress workloads to maintain intensity without signs/symptoms of physical distress. Continue to  progress workloads to maintain intensity without signs/symptoms of physical distress.     Resistance Training   Training Prescription _3    Weight _4    Reps 10-15 10-15 10-15 10-15 10-15   Time  -  -  - 10 Minutes 10 Minutes     Oxygen   Oxygen _5    Liters _6 NuStep   Level 2 3  - 4 4   Minutes 17 17  - 17 17   METs 1.7 1.9  - 2 1.6     Arm Ergometer   Level  - _7 Minutes  - _8 Track   Laps _9 Minutes _10 Row Name 06/18/17 1500 06/23/17 1500 06/25/17 1555         Response to Exercise   Blood Pressure (Admit) 134/54 170/74 144/64     Blood Pressure (Exercise) 146/60 148/76 160/60     Blood Pressure (Exit) 118/60 124/72 144/62     Heart Rate (Admit) 89 bpm 101 bpm 80 bpm     Heart Rate (Exercise) 128 bpm 120 bpm 109 bpm     Heart Rate (Exit) 108 bpm 99 bpm 90 bpm     Oxygen Saturation (Admit) 89 % 94 % 95 %     Oxygen Saturation (Exercise) 88 % 91 % 93 %     Oxygen Saturation (Exit) 94 % 98 % 97 %     Rating of Perceived Exertion (Exercise) _11 Perceived Dyspnea (Exercise) 1 1 2  Duration Continue with 45 min of aerobic exercise without signs/symptoms of physical distress. Continue with 45 min of aerobic exercise without signs/symptoms of physical distress. Continue with 45 min of aerobic exercise without signs/symptoms of physical distress.     Intensity THRR unchanged THRR unchanged THRR unchanged       Progression   Progression Continue to progress workloads to maintain intensity without signs/symptoms of physical distress. Continue to progress workloads to maintain intensity without signs/symptoms of physical distress. Continue to progress workloads to maintain intensity without signs/symptoms of physical distress.       Resistance Training   Training Prescription Yes Yes Yes      Weight blue bands blue bands blue bands     Reps 10-15 10-15 10-15     Time 10 Minutes 10 Minutes 10 Minutes       Oxygen   Oxygen Continuous Continuous Continuous     Liters _0 NuStep   Level 4  - 4     Minutes 17  - 17     METs 2.1  - 2.4       Arm Ergometer   Level _1 Minutes _2 Track   Laps 11 11  -     Minutes 17 17  -        Exercise Comments:   Exercise Goals and Review:   Exercise Goals Re-Evaluation :     Exercise Goals Re-Evaluation    Row Name 05/29/17 1119 06/30/17 0956           Exercise Goal Re-Evaluation   Exercise Goals Review Increase Strenth and Stamina;Increase Physical Activity Increase Strength and Stamina;Increase Physical Activity;Able to understand and use Dyspnea scale;Able to understand and use rate of perceived exertion (RPE) scale;Knowledge and understanding of Target Heart Rate Range (THRR);Understanding of Exercise Prescription      Comments Patient has only attended one exercise session. Will cont. to monitor and progress as able.  Patient is progressing well in program. Averages 2.0-2.4 METs. She is open to workload changes. Will cont to progress as able.       Expected Outcomes Through exercising at rehab and at home, patient will increase physicial capacity, strength, and stamina.  Through exercising at rehab and at home, patient will increase physicial capacity, strength, and stamina.          Discharge Exercise Prescription (Final Exercise Prescription Changes):     Exercise Prescription Changes - 06/25/17 1555      Response to Exercise   Blood Pressure (Admit) 144/64   Blood Pressure (Exercise) 160/60   Blood Pressure (Exit) 144/62   Heart Rate (Admit) 80 bpm   Heart Rate (Exercise) 109 bpm   Heart Rate (Exit) 90 bpm   Oxygen Saturation (Admit) 95 %   Oxygen Saturation (Exercise) 93 %   Oxygen Saturation (Exit) 97 %   Rating of Perceived Exertion (Exercise) 13   Perceived Dyspnea (Exercise) 2    Duration Continue with 45 min of aerobic exercise without signs/symptoms of physical distress.   Intensity THRR unchanged     Progression   Progression Continue to progress workloads to maintain intensity without signs/symptoms of physical distress.     Resistance Training   Training Prescription Yes   Weight blue bands   Reps 10-15   Time 10 Minutes     Oxygen   Oxygen Continuous   Liters  3     NuStep   Level 4   Minutes 17   METs 2.4     Arm Ergometer   Level 4   Minutes 17      Nutrition:  Target Goals: Understanding of nutrition guidelines, daily intake of sodium <1549m, cholesterol <2093m calories 30% from fat and 7% or less from saturated fats, daily to have 5 or more servings of fruits and vegetables.  Biometrics:     Pre Biometrics - 05/11/17 1107      Pre Biometrics   Grip Strength 33 kg       Nutrition Therapy Plan and Nutrition Goals:     Nutrition Therapy & Goals - 05/21/17 0829      Nutrition Therapy   Diet General, Healthful     Personal Nutrition Goals   Nutrition Goal Identify food quantities necessary to achieve wt loss of  -2# per week to a goal wt loss of 2.7-10.9 kg (6-24 lb) at graduation from pulmonary rehab.     Intervention Plan   Intervention Prescribe, educate and counsel regarding individualized specific dietary modifications aiming towards targeted core components such as weight, hypertension, lipid management, diabetes, heart failure and other comorbidities.   Expected Outcomes Short Term Goal: Understand basic principles of dietary content, such as calories, fat, sodium, cholesterol and nutrients.;Long Term Goal: Adherence to prescribed nutrition plan.      Nutrition Discharge: Rate Your Plate Scores:     Nutrition Assessments - 05/21/17 0826      Rate Your Plate Scores   Pre Score 54      Nutrition Goals Re-Evaluation:   Nutrition Goals Discharge (Final Nutrition Goals  Re-Evaluation):   Psychosocial: Target Goals: Acknowledge presence or absence of significant depression and/or stress, maximize coping skills, provide positive support system. Participant is able to verbalize types and ability to use techniques and skills needed for reducing stress and depression.  Initial Review & Psychosocial Screening:     Initial Psych Review & Screening - 05/11/17 1113      Initial Review   Current issues with History of Depression     Family Dynamics   Good Support System? Yes     Barriers   Psychosocial barriers to participate in program The patient should benefit from training in stress management and relaxation.     Screening Interventions   Interventions Encouraged to exercise      Quality of Life Scores:   PHQ-9: Recent Review Flowsheet Data    [facility] 05/11/2017 06/20/2016 04/12/2014   Decreased Interest 3 0 0   Down, Depressed, Hopeless 2 0 0   PHQ - 2 Score 5 0 0   Altered sleeping 3 - -   Tired, decreased energy 3 - -   Change in appetite 3 - -   Feeling bad or failure about yourself  0 - -   Trouble concentrating 0 - -   Moving slowly or fidgety/restless 0 - -   Suicidal thoughts 0 - -   PHQ-9 Score 14 - -   Difficult doing work/chores Somewhat difficult - -     Interpretation of Total Score  Total Score Depression Severity:  1-4 = Minimal depression, 5-9 = Mild depression, 10-14 = Moderate depression, 15-19 = Moderately severe depression, 20-27 = Severe depression   Psychosocial Evaluation and Intervention:     Psychosocial Evaluation - 05/11/17 1116      Psychosocial Evaluation & Interventions   Interventions Physician referral;Therapist referral;Encouraged to exercise with  the program and follow exercise prescription   Continue Psychosocial Services  Follow up required by counselor      Psychosocial Re-Evaluation:     Psychosocial Re-Evaluation    [place] Name 07/01/17 2116             Psychosocial  Re-Evaluation   Current issues with Current Depression;History of Depression       Comments patient follows up with therapist       Expected Outcomes patient will remain free from psychosocial barriers to participation in pulmonary rehab       Interventions Encouraged to attend Pulmonary Rehabilitation for the exercise       Continue Psychosocial Services  No Follow up required          Psychosocial Discharge (Final Psychosocial Re-Evaluation):     Psychosocial Re-Evaluation - 07/01/17 2116      Psychosocial Re-Evaluation   Current issues with Current Depression;History of Depression   Comments patient follows up with therapist   Expected Outcomes patient will remain free from psychosocial barriers to participation in pulmonary rehab   Interventions Encouraged to attend Pulmonary Rehabilitation for the exercise   Continue Psychosocial Services  No Follow up required      Education: Education Goals: Education classes will be provided on a weekly basis, covering required topics. Participant will state understanding/return demonstration of topics presented.  Learning Barriers/Preferences:     Learning Barriers/Preferences - 05/11/17 1102      Learning Barriers/Preferences   Learning Barriers None   Learning Preferences Video;Pictoral;Audio;Computer/Internet      Education Topics: Risk Factor Reduction:  -Group instruction that is supported by a PowerPoint presentation. Instructor discusses the definition of a risk factor, different risk factors for pulmonary disease, and how the heart and lungs work together.     PULMONARY REHAB OTHER RESPIRATORY from 06/25/2017 in [place]  Date  05/28/17  Educator  ep  Instruction Review Code  2- meets goals/outcomes      Nutrition for Pulmonary Patient:  -Group instruction provided by PowerPoint slides, verbal discussion, and written materials to support subject matter. The instructor gives an  explanation and review of healthy diet recommendations, which includes a discussion on weight management, recommendations for fruit and vegetable consumption, as well as protein, fluid, caffeine, fiber, sodium, sugar, and alcohol. Tips for eating when patients are short of breath are discussed.   PULMONARY REHAB OTHER RESPIRATORY from 06/25/2017 in [place]  Date  06/25/17  Educator  [place]  Instruction Review Code  2- meets goals/outcomes      Pursed Lip Breathing:  -Group instruction that is supported by demonstration and informational handouts. Instructor discusses the benefits of pursed lip and diaphragmatic breathing and detailed demonstration on how to preform both.     Oxygen Safety:  -Group instruction provided by PowerPoint, verbal discussion, and written material to support subject matter. There is an overview of "What is Oxygen" and "Why do we need it".  Instructor also reviews how to create a safe environment for oxygen use, the importance of using oxygen as prescribed, and the risks of noncompliance. There is a brief discussion on traveling with oxygen and resources the patient may utilize.   Oxygen Equipment:  -Group instruction provided by [facility] Staff utilizing handouts, written materials, and equipment demonstrations.   Signs and Symptoms:  -Group instruction provided by written material and verbal discussion to support subject matter. Warning signs and symptoms of infection, stroke, and heart  attack are reviewed and when to call the physician/911 reinforced. Tips for preventing the spread of infection discussed.   Advanced Directives:  -Group instruction provided by verbal instruction and written material to support subject matter. Instructor reviews Advanced Directive laws and proper instruction for filling out document.   Pulmonary Video:  -Group video education that reviews the importance of medication and oxygen compliance,  exercise, good nutrition, pulmonary hygiene, and pursed lip and diaphragmatic breathing for the pulmonary patient.   Exercise for the Pulmonary Patient:  -Group instruction that is supported by a PowerPoint presentation. Instructor discusses benefits of exercise, core components of exercise, frequency, duration, and intensity of an exercise routine, importance of utilizing pulse oximetry during exercise, safety while exercising, and options of places to exercise outside of rehab.     Pulmonary Medications:  -Verbally interactive group education provided by instructor with focus on inhaled medications and proper administration.   Anatomy and Physiology of the Respiratory System and Intimacy:  -Group instruction provided by PowerPoint, verbal discussion, and written material to support subject matter. Instructor reviews respiratory cycle and anatomical components of the respiratory system and their functions. Instructor also reviews differences in obstructive and restrictive respiratory diseases with examples of each. Intimacy, Sex, and Sexuality differences are reviewed with a discussion on how relationships can change when diagnosed with pulmonary disease. Common sexual concerns are reviewed.   MD DAY -A group question and answer session with a medical doctor that allows participants to ask questions that relate to their pulmonary disease state.   OTHER EDUCATION -Group or individual verbal, written, or video instructions that support the educational goals of the pulmonary rehab program.   Knowledge Questionnaire Score:     Knowledge Questionnaire Score - 05/13/17 0941      Knowledge Questionnaire Score   Pre Score 8/13      Core Components/Risk Factors/Patient Goals at Admission:     Personal Goals and Risk Factors at Admission - 05/11/17 1108      Core Components/Risk Factors/Patient Goals on Admission    Weight Management Yes   Intervention Weight Management: Develop a  combined nutrition and exercise program designed to reach desired caloric intake, while maintaining appropriate intake of nutrient and fiber, sodium and fats, and appropriate energy expenditure required for the weight goal.   Admit Weight 213 lb 3 oz (96.7 kg)   Goal Weight: Short Term 202 lb 13.2 oz (92 kg)   Goal Weight: Long Term 203 lb (92.1 kg)   Expected Outcomes Short Term: Continue to assess and modify interventions until short term weight is achieved   Improve shortness of breath with ADL's Yes   Intervention Provide education, individualized exercise plan and daily activity instruction to help decrease symptoms of SOB with activities of daily living.   Expected Outcomes Short Term: Achieves a reduction of symptoms when performing activities of daily living.   Develop more efficient breathing techniques such as purse lipped breathing and diaphragmatic breathing; and practicing self-pacing with activity Yes   Intervention Provide education, demonstration and support about specific breathing techniuqes utilized for more efficient breathing. Include techniques such as pursed lipped breathing, diaphragmatic breathing and self-pacing activity.   Expected Outcomes Short Term: Participant will be able to demonstrate and use breathing techniques as needed throughout daily activities.   Stress Yes   Intervention Offer individual and/or small group education and counseling on adjustment to heart disease, stress management and health-related lifestyle change. Teach and support self-help strategies.   Expected Outcomes Short Term:  Participant demonstrates changes in health-related behavior, relaxation and other stress management skills, ability to obtain effective social support, and compliance with psychotropic medications if prescribed.      Core Components/Risk Factors/Patient Goals Review:      Goals and Risk Factor Review    Row Name 06/02/17 1718 07/01/17 2115           Core  Components/Risk Factors/Patient Goals Review   Personal Goals Review  - Weight Management/Obesity;Improve shortness of breath with ADL's;Develop more efficient breathing techniques such as purse lipped breathing and diaphragmatic breathing and practicing self-pacing with activity.      Review patient has only attended 2 sessions since admission and too soon to evaluate progress towards goals patient has mastered the purse lip breathing technique and is often observed using the technique to reduce her shortness of breath. she is not loosing weight but admits to not changing her caloric intake.      Expected Outcomes see admission expected outcomes see admission expected outcomes         Core Components/Risk Factors/Patient Goals at Discharge (Final Review):      Goals and Risk Factor Review - 07/01/17 2115      Core Components/Risk Factors/Patient Goals Review   Personal Goals Review Weight Management/Obesity;Improve shortness of breath with ADL's;Develop more efficient breathing techniques such as purse lipped breathing and diaphragmatic breathing and practicing self-pacing with activity.   Review patient has mastered the purse lip breathing technique and is often observed using the technique to reduce her shortness of breath. she is not loosing weight but admits to not changing her caloric intake.   Expected Outcomes see admission expected outcomes      ITP Comments:   Comments: ITP REVIEW Pt is making expected progress toward pulmonary rehab goals after completing 8 sessions. Recommend continued exercise, life style modification, education, and utilization of breathing techniques to increase stamina and strength and decrease shortness of breath with exertion.

## 2017-07-02 ENCOUNTER — Encounter (HOSPITAL_COMMUNITY)
Admission: RE | Admit: 2017-07-02 | Discharge: 2017-07-02 | Disposition: A | Discharge status: Home / Self Care | Payer: Medicare Other | Source: Ambulatory Visit | Attending: Internal Medicine | Admitting: Internal Medicine

## 2017-07-02 VITALS — Wt 207.2 lb

## 2017-07-02 DIAGNOSIS — Z7982 Long term (current) use of aspirin: Secondary | ICD-10-CM | POA: Diagnosis not present

## 2017-07-02 DIAGNOSIS — J8410 Pulmonary fibrosis, unspecified: Secondary | ICD-10-CM

## 2017-07-02 DIAGNOSIS — J9610 Chronic respiratory failure, unspecified whether with hypoxia or hypercapnia: Secondary | ICD-10-CM | POA: Diagnosis not present

## 2017-07-02 DIAGNOSIS — J449 Chronic obstructive pulmonary disease, unspecified: Secondary | ICD-10-CM | POA: Diagnosis not present

## 2017-07-02 DIAGNOSIS — Z79899 Other long term (current) drug therapy: Secondary | ICD-10-CM | POA: Diagnosis not present

## 2017-07-02 DIAGNOSIS — Z7951 Long term (current) use of inhaled steroids: Secondary | ICD-10-CM | POA: Diagnosis not present

## 2017-07-02 NOTE — Progress Notes (Signed)
[name] 71 y.o. female   DOB: [birth year] MRN: [id number]          Nutrition Note Dx: Post-inflammatory pulmonary fibrosis Spoke with pt. Pt reports she has lost 4 lb since admission, which pt desired. Pt's short-term wt loss goal is 10 lb. There are some ways the pt can make her eating habits healthier. Pt's Rate Your Plate results reviewed with pt. Pt reports she has "No Salt in my salt shaker." Pt advised to stop using No Salt and use small quantities of salt if salt needed. Pt expressed understanding of the information reviewed via feedback method.    Nutrition Diagnosis ? Food-and nutrition-related knowledge deficit related to lack of exposure to information as related to diagnosis of pulmonary disease ? Obesity related to excessive energy intake as evidenced by a BMI of 43.1  Nutrition Intervention ? Pt's individual nutrition plan and goals reviewed with pt. ? Benefits of adopting healthy eating habits discussed when pt's Rate Your Plate reviewed. ? Encouraged pt to stop using No Salt salt substitute.  Goal(s) 1. Identify food quantities necessary to achieve wt loss of  -2# per week to a goal wt loss of 2.7-10.9 kg (6-24 lb) at graduation from pulmonary rehab. - working toward goal at this time Plan:  Pt to attend Pulmonary Nutrition class - met; 06/25/17 Will provide client-centered nutrition education as part of interdisciplinary care.   Monitor and evaluate progress toward nutrition goal with team.  Monitor and Evaluate progress toward nutrition goal with team.   Derek Mound, M.Ed, RD, LDN, CDE 07/02/2017 3:24 PM

## 2017-07-02 NOTE — Progress Notes (Signed)
Daily Session Note  Patient Details  Name: [name] MRN: [id number] Date of Birth: 12-03-1945 Referring Provider:     Pulmonary Rehab Walk Test from 05/21/2017 in [place]  Referring Provider  Dr. Melvyn Novas      Encounter Date: 07/02/2017  Check In:     Session Check In - 07/02/17 1330      Check-In   Staff Present Ramon Dredge, RN, MHA;Portia Rollene Rotunda, RN, Roque Cash, RN   Supervising physician immediately available to respond to emergencies [facility] immediately available   Physician(s) Dr. Wendee Beavers   Medication changes reported     No   Fall or balance concerns reported    No   Tobacco Cessation No Change   Warm-up and Cool-down Performed as group-led instruction   Resistance Training Performed Yes   VAD Patient? No     Pain Assessment   Currently in Pain? No/denies   Multiple Pain Sites No      Capillary Blood Glucose: No results found for this or any previous visit (from the past 24 hour(s)).      Exercise Prescription Changes - 07/02/17 1600      Response to Exercise   Blood Pressure (Admit) 118/70   Blood Pressure (Exercise) 180/70   Blood Pressure (Exit) 120/62   Heart Rate (Admit) 85 bpm   Heart Rate (Exercise) 129 bpm   Heart Rate (Exit) 95 bpm   Oxygen Saturation (Admit) 97 %   Oxygen Saturation (Exercise) 95 %   Oxygen Saturation (Exit) 99 %   Rating of Perceived Exertion (Exercise) 11   Perceived Dyspnea (Exercise) 1   Duration Continue with 45 min of aerobic exercise without signs/symptoms of physical distress.   Intensity THRR unchanged     Progression   Progression Continue to progress workloads to maintain intensity without signs/symptoms of physical distress.     Resistance Training   Training Prescription Yes   Weight blue bands   Reps 10-15   Time 10 Minutes     Oxygen   Oxygen Continuous   Liters 3     NuStep   Level 4   Minutes 17   METs 1.9     Track   Laps 11   Minutes 17       History  Smoking Status  . Former Smoker  . Packs/day: 1.50  . Years: 58.50  . Types: Cigarettes  . Quit date: 03/03/2005  Smokeless Tobacco  . Never Used    Goals Met:  Exercise tolerated well No report of cardiac concerns or symptoms Strength training completed today  Goals Unmet:  Not Applicable  Comments: Service time is from 1330 to 1520    Dr. Rush Farmer is Medical Director for Pulmonary Rehab at [facility].

## 2017-07-07 ENCOUNTER — Encounter (HOSPITAL_COMMUNITY): Payer: Medicare Other

## 2017-07-09 ENCOUNTER — Encounter (HOSPITAL_COMMUNITY): Payer: Medicare Other

## 2017-07-14 ENCOUNTER — Encounter (HOSPITAL_COMMUNITY): Payer: Medicare Other

## 2017-07-15 ENCOUNTER — Ambulatory Visit (INDEPENDENT_AMBULATORY_CARE_PROVIDER_SITE_OTHER): Payer: Medicare Other | Admitting: Internal Medicine

## 2017-07-15 ENCOUNTER — Encounter: Payer: Self-pay | Admitting: Internal Medicine

## 2017-07-15 VITALS — BP 130/70 | HR 75 | Temp 98.7°F | Ht 59.0 in | Wt 207.4 lb

## 2017-07-15 DIAGNOSIS — I10 Essential (primary) hypertension: Secondary | ICD-10-CM | POA: Diagnosis not present

## 2017-07-15 DIAGNOSIS — M0589 Other rheumatoid arthritis with rheumatoid factor of multiple sites: Secondary | ICD-10-CM | POA: Diagnosis not present

## 2017-07-15 DIAGNOSIS — I5033 Acute on chronic diastolic (congestive) heart failure: Secondary | ICD-10-CM | POA: Diagnosis not present

## 2017-07-15 DIAGNOSIS — J8410 Pulmonary fibrosis, unspecified: Secondary | ICD-10-CM

## 2017-07-15 DIAGNOSIS — J9612 Chronic respiratory failure with hypercapnia: Secondary | ICD-10-CM

## 2017-07-15 DIAGNOSIS — J9611 Chronic respiratory failure with hypoxia: Secondary | ICD-10-CM | POA: Diagnosis not present

## 2017-07-15 MED ORDER — TRAMADOL HCL 50 MG PO TABS: ORAL_TABLET | ORAL | 0 refills | Status: DC

## 2017-07-15 NOTE — Patient Instructions (Addendum)
Limit your sodium (Salt) intake  Please check your blood pressure on a regular basis.  If it is consistently greater than 150/90, please make an office appointment.  Pulmonary follow-up as scheduled  Rheumatology follow-up  Return in 6 months for follow-up  You need to lose weight.  Consider a lower calorie diet and regular exercise as tolerated.  Continue pulmonary rehabilitation

## 2017-07-15 NOTE — Progress Notes (Signed)
Subjective:    Patient ID: [name], female    DOB: 12-07-1945, 71 y.o.   MRN: [id number]  HPI  71 year old patient who is seen today for follow-up.  She is followed by pulmonary medicine as well as rheumatology.  She has a history of psoriatic arthritis as well as interstitial lung disease.  She has chronic respiratory failure with hypoxemia and remains on chronic oxygen therapy. She remains on tramadol for arthritic pain.  Her pulmonary status has been stable.  She has been participating in pulmonary rehabilitation. She has a history of dyslipidemia and remains on statin therapy.  She has coronary artery disease  Past Medical History:  Diagnosis Date  . Arthritis    OA RIGHT KNEE WITH PAIN  . Barrett esophagus   . Chronic respiratory failure (HCC)   . COPD (chronic obstructive pulmonary disease) (HCC)   . GERD (gastroesophageal reflux disease)   . History of ARDS 2006  . History of home oxygen therapy    AT NIGHT WHEN SLEEPING 2 L / MIN NASAL CANNULA  . Hypertension   . Hypothyroidism   . NSTEMI (non-ST elevated myocardial infarction) (HCC) 05/31/2015  . Pneumococcal pneumonia (HCC) 2006   HOSPITALIZED AND DEVELOPED ARDS  . Psoriatic arthritis (HCC)   . Pulmonary fibrosis (HCC)   . Rheumatic disease   . SOB (shortness of breath) on exertion      Social History   Social History  . Marital status: Married    Spouse name: N/A  . Number of children: N/A  . Years of education: N/A   Occupational History  . Floral Manager  [name]   Social History Main Topics  . Smoking status: Former Smoker    Packs/day: 1.50    Years: 58.50    Types: Cigarettes    Quit date: 03/03/2005  . Smokeless tobacco: Never Used  . Alcohol use No  . Drug use: No  . Sexual activity: Not on file   Other Topics Concern  . Not on file   Social History Narrative  . No narrative on file    Past Surgical History:  Procedure Laterality Date  . ABDOMINOPLASTY    . CARDIAC  CATHETERIZATION N/A 06/01/2015   Procedure: Left Heart Cath and Coronary Angiography;  Surgeon: Runell Gess, MD;  Location: [facility] INVASIVE CV LAB;  Service: Cardiovascular;  Laterality: N/A;  . CARPAL TUNNEL RELEASE    . CHOLECYSTECTOMY    . CORONARY ARTERY BYPASS GRAFT N/A 06/04/2015   Procedure: CORONARY ARTERY BYPASS GRAFT times three            with left internal mammary artery and right leg saphenous vein;  Surgeon: Alleen Borne, MD;  Location: [facility] OR;  Service: Open Heart Surgery;  Laterality: N/A;  . cosmetic breast surgery    . JOINT REPLACEMENT    . KNEE ARTHROSCOPY Left   . TEE WITHOUT CARDIOVERSION  06/04/2015   Procedure: TRANSESOPHAGEAL ECHOCARDIOGRAM (TEE);  Surgeon: Alleen Borne, MD;  Location: [facility] OR;  Service: Open Heart Surgery;;  . TOTAL KNEE ARTHROPLASTY Left   . TOTAL KNEE ARTHROPLASTY Right 06/06/2013   Procedure: RIGHT TOTAL KNEE ARTHROPLASTY;  Surgeon: Loanne Drilling, MD;  Location: [facility] ORS;  Service: Orthopedics;  Laterality: Right;    Family History  Problem Relation Age of Onset  . Breast cancer Mother   . Coronary artery disease Father   . Rheum arthritis Father     No Known Allergies  Current Outpatient Prescriptions  on File Prior to Visit  Medication Sig Dispense Refill  . acetaminophen (TYLENOL ARTHRITIS PAIN) 650 MG CR tablet Take 1,300 mg by mouth every 8 (eight) hours as needed for pain. Per bottle directions as needed    . albuterol (PROAIR HFA) 108 (90 Base) MCG/ACT inhaler 2 puffs every 4 hours as needed only  if your can't catch your breath 1 Inhaler 11  . aspirin EC 325 MG EC tablet Take 1 tablet (325 mg total) by mouth daily. 30 tablet 0  . atorvastatin (LIPITOR) 80 MG tablet Take 1 tablet (80 mg total) by mouth daily at 6 PM. 90 tablet 3  . budesonide-formoterol (SYMBICORT) 160-4.5 MCG/ACT inhaler Inhale 2 puffs into the lungs 2 (two) times daily. 3 Inhaler 6  . Certolizumab Pegol (CIMZIA [place]) Inject into the skin as directed. Cimzia, Injected by  provider as directed    . Cholecalciferol (VITAMIN D3) 2000 UNITS TABS Take 2,000 Int'l Units by mouth daily.    . citalopram (CELEXA) 40 MG tablet Take 1 tablet (40 mg total) by mouth every morning. 90 tablet 0  . famotidine (PEPCID) 20 MG tablet One at bedtime    . furosemide (LASIX) 20 MG tablet Take 1 tablet (20 mg total) by mouth 2 (two) times daily. 180 tablet 3  . levothyroxine (SYNTHROID, LEVOTHROID) 150 MCG tablet TAKE 1 TABLET BY MOUTH  DAILY BEFORE BREAKFAST 90 tablet 3  . losartan (COZAAR) 50 MG tablet TAKE 1 TABLET BY MOUTH  DAILY 90 tablet 3  . montelukast (SINGULAIR) 10 MG tablet Take 1 tablet (10 mg total) by mouth at bedtime. 90 tablet 3  . NON FORMULARY 2lpm with sleep and with exertion if needed    . pantoprazole (PROTONIX) 40 MG tablet TAKE 1 TABLET BY MOUTH  DAILY 30 TO 60 MINUTES  BEFORE FIRST MEAL OF THE  DAY 90 tablet 2  . triamcinolone cream (KENALOG) 0.1 % Apply 1 application topically 2 (two) times daily as needed (for psorisis).     No current facility-administered medications on file prior to visit.     BP (!) 162/70 (BP Location: Left Arm, Patient Position: Sitting, Cuff Size: Normal)   Pulse 75   Temp 98.7 F (37.1 C) (Oral)   Ht 4\' 11"  (1.499 m)   Wt 207 lb 6.4 oz (94.1 kg)   SpO2 90%   BMI 41.89 kg/m     Review of Systems  Constitutional: Positive for unexpected weight change.  HENT: Negative for congestion, dental problem, hearing loss, rhinorrhea, sinus pressure, sore throat and tinnitus.   Eyes: Negative for pain, discharge and visual disturbance.  Respiratory: Positive for shortness of breath. Negative for cough.   Cardiovascular: Negative for chest pain, palpitations and leg swelling.  Gastrointestinal: Positive for diarrhea. Negative for abdominal distention, abdominal pain, blood in stool, constipation, nausea and vomiting.  Genitourinary: Negative for difficulty urinating, dysuria, flank pain, frequency, hematuria, pelvic pain, urgency,  vaginal bleeding, vaginal discharge and vaginal pain.  Musculoskeletal: Positive for arthralgias, gait problem and myalgias. Negative for joint swelling.  Skin: Negative for rash.  Neurological: Negative for dizziness, syncope, speech difficulty, weakness, numbness and headaches.  Hematological: Negative for adenopathy.  Psychiatric/Behavioral: Negative for agitation, behavioral problems and dysphoric mood. The patient is not nervous/anxious.        Objective:   Physical Exam  Constitutional: She is oriented to person, place, and time. She appears well-developed and well-nourished.   Weight 207 Chronic nasal cannula O2  HENT:  Head: Normocephalic.  Right Ear: External ear normal.  Left Ear: External ear normal.  Mouth/Throat: Oropharynx is clear and moist.  Eyes: Pupils are equal, round, and reactive to light. Conjunctivae and EOM are normal.  Neck: Normal range of motion. Neck supple. No thyromegaly present.  Cardiovascular: Normal rate, regular rhythm, normal heart sounds and intact distal pulses.   Pulmonary/Chest: Effort normal.  Dry basilar rales O2 saturation after rest 97%   Abdominal: Soft. Bowel sounds are normal. She exhibits no mass. There is no tenderness.  Musculoskeletal: Normal range of motion.  Lymphadenopathy:    She has no cervical adenopathy.  Neurological: She is alert and oriented to person, place, and time.  Skin: Skin is warm and dry. No rash noted.  Psychiatric: She has a normal mood and affect. Her behavior is normal.          Assessment & Plan:   Chronic respiratory failure Interstitial lung disease Coronary artery disease, stable Essential hypertension, stable.  Repeat blood pressure 130/70  Schedule CPX Medications updated Follow-up rheumatology  Rogelia Boga

## 2017-07-16 ENCOUNTER — Encounter (HOSPITAL_COMMUNITY): Payer: Medicare Other

## 2017-07-21 ENCOUNTER — Encounter (HOSPITAL_COMMUNITY)
Admission: RE | Admit: 2017-07-21 | Discharge: 2017-07-21 | Disposition: A | Discharge status: Home / Self Care | Payer: Medicare Other | Source: Ambulatory Visit | Attending: Internal Medicine | Admitting: Internal Medicine

## 2017-07-21 VITALS — Wt 206.6 lb

## 2017-07-21 DIAGNOSIS — L4050 Arthropathic psoriasis, unspecified: Secondary | ICD-10-CM | POA: Insufficient documentation

## 2017-07-21 DIAGNOSIS — M1711 Unilateral primary osteoarthritis, right knee: Secondary | ICD-10-CM | POA: Diagnosis not present

## 2017-07-21 DIAGNOSIS — J9610 Chronic respiratory failure, unspecified whether with hypoxia or hypercapnia: Secondary | ICD-10-CM | POA: Diagnosis not present

## 2017-07-21 DIAGNOSIS — Z7982 Long term (current) use of aspirin: Secondary | ICD-10-CM | POA: Insufficient documentation

## 2017-07-21 DIAGNOSIS — Z7951 Long term (current) use of inhaled steroids: Secondary | ICD-10-CM | POA: Insufficient documentation

## 2017-07-21 DIAGNOSIS — J449 Chronic obstructive pulmonary disease, unspecified: Secondary | ICD-10-CM | POA: Diagnosis not present

## 2017-07-21 DIAGNOSIS — I252 Old myocardial infarction: Secondary | ICD-10-CM | POA: Insufficient documentation

## 2017-07-21 DIAGNOSIS — J8410 Pulmonary fibrosis, unspecified: Secondary | ICD-10-CM | POA: Diagnosis not present

## 2017-07-21 DIAGNOSIS — I10 Essential (primary) hypertension: Secondary | ICD-10-CM | POA: Diagnosis not present

## 2017-07-21 DIAGNOSIS — Z87891 Personal history of nicotine dependence: Secondary | ICD-10-CM | POA: Insufficient documentation

## 2017-07-21 DIAGNOSIS — K219 Gastro-esophageal reflux disease without esophagitis: Secondary | ICD-10-CM | POA: Diagnosis not present

## 2017-07-21 DIAGNOSIS — Z79899 Other long term (current) drug therapy: Secondary | ICD-10-CM | POA: Diagnosis not present

## 2017-07-21 DIAGNOSIS — E039 Hypothyroidism, unspecified: Secondary | ICD-10-CM | POA: Insufficient documentation

## 2017-07-21 NOTE — Progress Notes (Signed)
Daily Session Note  Patient Details  Name: [name] MRN: [id number] Date of Birth: Mar 16, 1946 Referring Provider:     Pulmonary Rehab Walk Test from 05/21/2017 in [place]  Referring Provider  Dr. Melvyn Novas      Encounter Date: 07/21/2017  Check In:     Session Check In - 07/21/17 1330      Check-In   Location [facility]   Staff Present Su Hilt, MS, ACSM RCEP, Exercise Physiologist;Lisa Ysidro Evert, RN;Laurna Shetley Rollene Rotunda, RN, BSN   Supervising physician immediately available to respond to emergencies [facility] immediately available   Physician(s) Dr. Carolin Sicks   Medication changes reported     No   Fall or balance concerns reported    No   Tobacco Cessation No Change   Warm-up and Cool-down Performed as group-led instruction   Resistance Training Performed Yes   VAD Patient? No     Pain Assessment   Currently in Pain? No/denies   Multiple Pain Sites No      Capillary Blood Glucose: No results found for this or any previous visit (from the past 24 hour(s)).      Exercise Prescription Changes - 07/21/17 1550      Response to Exercise   Blood Pressure (Admit) 126/62   Blood Pressure (Exercise) 160/60   Blood Pressure (Exit) 124/60   Heart Rate (Admit) 92 bpm   Heart Rate (Exercise) 124 bpm   Heart Rate (Exit) 102 bpm   Oxygen Saturation (Admit) 95 %   Oxygen Saturation (Exercise) 85 %   Oxygen Saturation (Exit) 95 %   Rating of Perceived Exertion (Exercise) 17   Perceived Dyspnea (Exercise) 4   Duration Continue with 45 min of aerobic exercise without signs/symptoms of physical distress.   Intensity THRR unchanged     Progression   Progression Continue to progress workloads to maintain intensity without signs/symptoms of physical distress.     Resistance Training   Training Prescription Yes   Weight blue bands   Reps 10-15   Time 10 Minutes     Oxygen   Oxygen Continuous   Liters 3     NuStep   Level 4   Minutes 17   METs 2.4     Arm Ergometer   Level 4   Minutes 17     Track   Laps 6   Minutes 17      History  Smoking Status  . Former Smoker  . Packs/day: 1.50  . Years: 58.50  . Types: Cigarettes  . Quit date: 03/03/2005  Smokeless Tobacco  . Never Used    Goals Met:  Independence with exercise equipment Improved SOB with ADL's Using PLB without cueing & demonstrates good technique Exercise tolerated well No report of cardiac concerns or symptoms Strength training completed today  Goals Unmet:  O2 Sat with ambulation on personal portable concentrator 4L pulse  Comments: Service time is from 1330 to 1505   Dr. Rush Farmer is Medical Director for Pulmonary Rehab at [facility]

## 2017-07-23 ENCOUNTER — Encounter (HOSPITAL_COMMUNITY)
Admission: RE | Admit: 2017-07-23 | Discharge: 2017-07-23 | Disposition: A | Discharge status: Home / Self Care | Payer: Medicare Other | Source: Ambulatory Visit | Attending: Internal Medicine | Admitting: Internal Medicine

## 2017-07-23 ENCOUNTER — Encounter: Payer: Self-pay | Admitting: Internal Medicine

## 2017-07-23 VITALS — Wt 207.5 lb

## 2017-07-23 DIAGNOSIS — Z79899 Other long term (current) drug therapy: Secondary | ICD-10-CM | POA: Diagnosis not present

## 2017-07-23 DIAGNOSIS — J9610 Chronic respiratory failure, unspecified whether with hypoxia or hypercapnia: Secondary | ICD-10-CM | POA: Diagnosis not present

## 2017-07-23 DIAGNOSIS — J8410 Pulmonary fibrosis, unspecified: Secondary | ICD-10-CM | POA: Diagnosis not present

## 2017-07-23 DIAGNOSIS — J449 Chronic obstructive pulmonary disease, unspecified: Secondary | ICD-10-CM | POA: Diagnosis not present

## 2017-07-23 DIAGNOSIS — Z7951 Long term (current) use of inhaled steroids: Secondary | ICD-10-CM | POA: Diagnosis not present

## 2017-07-23 DIAGNOSIS — Z7982 Long term (current) use of aspirin: Secondary | ICD-10-CM | POA: Diagnosis not present

## 2017-07-23 NOTE — Progress Notes (Signed)
Daily Session Note  Patient Details  Name: MARJORIA MANCILLAS MRN: [id number] Date of Birth: 11-19-1945 Referring Provider:     Pulmonary Rehab Walk Test from 05/21/2017 in [place]  Referring Provider  Dr. Melvyn Novas      Encounter Date: 07/23/2017  Check In:     Session Check In - 07/23/17 1350      Check-In   Location [facility]   Staff Present Su Hilt, MS, ACSM RCEP, Exercise Physiologist;Portia Rollene Rotunda, RN, BSN   Supervising physician immediately available to respond to emergencies [facility] immediately available   Physician(s) Dr. Wynelle Cleveland   Medication changes reported     No   Fall or balance concerns reported    No   Tobacco Cessation No Change   Warm-up and Cool-down Performed as group-led instruction   Resistance Training Performed Yes   VAD Patient? No     Pain Assessment   Currently in Pain? No/denies   Multiple Pain Sites No      Capillary Blood Glucose: No results found for this or any previous visit (from the past 24 hour(s)).      Exercise Prescription Changes - 07/23/17 1600      Response to Exercise   Blood Pressure (Admit) 124/68   Blood Pressure (Exercise) 162/60   Blood Pressure (Exit) 118/68   Heart Rate (Admit) 89 bpm   Heart Rate (Exercise) 122 bpm   Heart Rate (Exit) 96 bpm   Oxygen Saturation (Admit) 96 %   Oxygen Saturation (Exercise) 92 %   Oxygen Saturation (Exit) 97 %   Rating of Perceived Exertion (Exercise) 13   Perceived Dyspnea (Exercise) 2   Duration Continue with 45 min of aerobic exercise without signs/symptoms of physical distress.   Intensity THRR unchanged     Progression   Progression Continue to progress workloads to maintain intensity without signs/symptoms of physical distress.     Resistance Training   Training Prescription Yes   Weight blue bands   Reps 10-15   Time 10 Minutes     Oxygen   Oxygen Continuous   Liters 3     NuStep   Level 4   Minutes  17   METs 1.2     Track   Laps 8   Minutes 17      History  Smoking Status  . Former Smoker  . Packs/day: 1.50  . Years: 58.50  . Types: Cigarettes  . Quit date: 03/03/2005  Smokeless Tobacco  . Never Used    Goals Met:  Exercise tolerated well No report of cardiac concerns or symptoms Strength training completed today  Goals Unmet:  Not Applicable  Comments: Service time is from 1:30p to 3:30p    Dr. Rush Farmer is Medical Director for Pulmonary Rehab at [facility].

## 2017-07-24 NOTE — Progress Notes (Signed)
Pulmonary Individual Treatment Plan  Patient Details  Name: [name] MRN: [id number] Date of Birth: 13-Jun-1946 Referring Provider:     Pulmonary Rehab Walk Test from 05/21/2017 in [place]  Referring Provider  Dr. Melvyn Novas      Initial Encounter Date:    Pulmonary Rehab Walk Test from 05/21/2017 in [facility]  Date  05/22/17  Referring Provider  Dr. Melvyn Novas      Visit Diagnosis: No diagnosis found.  Patient's Home Medications on Admission:   Current Outpatient Prescriptions:  .  acetaminophen (TYLENOL ARTHRITIS PAIN) 650 MG CR tablet, Take 1,300 mg by mouth every 8 (eight) hours as needed for pain. Per bottle directions as needed, Disp: , Rfl:  .  albuterol (PROAIR HFA) 108 (90 Base) MCG/ACT inhaler, 2 puffs every 4 hours as needed only  if your can't catch your breath, Disp: 1 Inhaler, Rfl: 11 .  aspirin EC 325 MG EC tablet, Take 1 tablet (325 mg total) by mouth daily., Disp: 30 tablet, Rfl: 0 .  atorvastatin (LIPITOR) 80 MG tablet, Take 1 tablet (80 mg total) by mouth daily at 6 PM., Disp: 90 tablet, Rfl: 3 .  budesonide-formoterol (SYMBICORT) 160-4.5 MCG/ACT inhaler, Inhale 2 puffs into the lungs 2 (two) times daily., Disp: 3 Inhaler, Rfl: 6 .  Certolizumab Pegol (CIMZIA [place]), Inject into the skin as directed. Cimzia, Injected by provider as directed, Disp: , Rfl:  .  Cholecalciferol (VITAMIN D3) 2000 UNITS TABS, Take 2,000 Int'l Units by mouth daily., Disp: , Rfl:  .  citalopram (CELEXA) 40 MG tablet, Take 1 tablet (40 mg total) by mouth every morning., Disp: 90 tablet, Rfl: 0 .  famotidine (PEPCID) 20 MG tablet, One at bedtime, Disp: , Rfl:  .  furosemide (LASIX) 20 MG tablet, Take 1 tablet (20 mg total) by mouth 2 (two) times daily., Disp: 180 tablet, Rfl: 3 .  levothyroxine (SYNTHROID, LEVOTHROID) 150 MCG tablet, TAKE 1 TABLET BY MOUTH  DAILY BEFORE BREAKFAST, Disp: 90 tablet, Rfl: 3 .  losartan (COZAAR) 50 MG  tablet, TAKE 1 TABLET BY MOUTH  DAILY, Disp: 90 tablet, Rfl: 3 .  montelukast (SINGULAIR) 10 MG tablet, Take 1 tablet (10 mg total) by mouth at bedtime., Disp: 90 tablet, Rfl: 3 .  NON FORMULARY, 2lpm with sleep and with exertion if needed, Disp: , Rfl:  .  pantoprazole (PROTONIX) 40 MG tablet, TAKE 1 TABLET BY MOUTH  DAILY 30 TO 60 MINUTES  BEFORE FIRST MEAL OF THE  DAY, Disp: 90 tablet, Rfl: 2 .  traMADol (ULTRAM) 50 MG tablet, TAKE 1-2 TABLETS EVERY 6 HOURS AS NEEDED FOR PAIN, Disp: 90 tablet, Rfl: 0 .  triamcinolone cream (KENALOG) 0.1 %, Apply 1 application topically 2 (two) times daily as needed (for psorisis)., Disp: , Rfl:   Past Medical History: Past Medical History:  Diagnosis Date  . Arthritis    OA RIGHT KNEE WITH PAIN  . Barrett esophagus   . Chronic respiratory failure ([place])   . COPD (chronic obstructive pulmonary disease) ([place])   . GERD (gastroesophageal reflux disease)   . History of ARDS 2006  . History of home oxygen therapy    AT NIGHT WHEN SLEEPING 2 L / MIN NASAL CANNULA  . Hypertension   . Hypothyroidism   . NSTEMI (non-ST elevated myocardial infarction) ([place]) 05/31/2015  . Pneumococcal pneumonia ([place]) 2006   HOSPITALIZED AND DEVELOPED ARDS  . Psoriatic arthritis ([place])   . Pulmonary fibrosis (  [place])   . Rheumatic disease   . SOB (shortness of breath) on exertion     Tobacco Use: History  Smoking Status  . Former Smoker  . Packs/day: 1.50  . Years: 58.50  . Types: Cigarettes  . Quit date: 03/03/2005  Smokeless Tobacco  . Never Used    Labs: Recent Review Flowsheet Data    Labs for ITP Cardiac and Pulmonary Rehab Latest Ref Rng & Units 06/05/2015 06/05/2015 06/05/2015 07/12/2015 09/29/2016   Cholestrol <200 mg/dL - - - 117 157   LDLCALC <100 mg/dL - - - 51 61   LDLDIRECT mg/dL - - - - -   HDL >50 mg/dL - - - 51 76   Trlycerides <150 mg/dL - - - 75 101   Hemoglobin A1c 4.8 - 5.6 % - - - - -   PHART 7.350 - 7.450 7.268(L) - - - -   PCO2ART 35.0 - 45.0 mmHg  55.6(H) - - - -   HCO3 20.0 - 24.0 mEq/L 25.5(H) - - - -   TCO2 0 - 100 mmol/L 27 14 25  - -   ACIDBASEDEF 0.0 - 2.0 mmol/L 2.0 - - - -   O2SAT % 98.0 - - - -      Capillary Blood Glucose: Lab Results  Component Value Date   GLUCAP 113 (H) 06/06/2015   GLUCAP 107 (H) 06/06/2015   GLUCAP 128 (H) 06/05/2015   GLUCAP 114 (H) 06/05/2015   GLUCAP 109 (H) 06/05/2015     Pulmonary Assessment Scores:     Pulmonary Assessment Scores    Row Name 05/22/17 1008         ADL UCSD   ADL Phase Entry       mMRC Score   mMRC Score 1        Pulmonary Function Assessment:     Pulmonary Function Assessment - 05/11/17 1103      Breath   Bilateral Breath Sounds Clear;Decreased   Shortness of Breath Yes;Limiting activity      Exercise Target Goals:    Exercise Program Goal: Individual exercise prescription set with THRR, safety & activity barriers. Participant demonstrates ability to understand and report RPE using BORG scale, to self-measure pulse accurately, and to acknowledge the importance of the exercise prescription.  Exercise Prescription Goal: Starting with aerobic activity 30 plus minutes a day, 3 days per week for initial exercise prescription. Provide home exercise prescription and guidelines that participant acknowledges understanding prior to discharge.  Activity Barriers & Risk Stratification:   6 Minute Walk:     6 Minute Walk    Row Name 05/22/17 1001 05/22/17 1007       6 Minute Walk   Phase Initial  -    Distance 1010 feet  -    Walk Time 6 minutes  -    # of Rest Breaks 0  -    MPH 1.91  -    METS 2.45  -    RPE 17  -    Perceived Dyspnea  3  -    Symptoms Yes (comment)  -    Comments used wheelchair  -    Resting HR 88 bpm  -    Resting BP 139/76  -    Max Ex. HR 140 bpm  -    Max Ex. BP 197/100  RECHECK: 209/75, 188/94, after seated for 20 minutes 154/84  -      Interval HR   Baseline HR (retired) 88  -  1 Minute HR 116  -    2 Minute  HR 118  -    3 Minute HR 126  -    4 Minute HR 136  -    5 Minute HR 140  -    6 Minute HR 136  -    2 Minute Post HR 121  -    Interval Heart Rate? Yes  -      Interval Oxygen   Interval Oxygen? Yes  -    Baseline Oxygen Saturation % 92 %  -    Resting Liters of Oxygen 2 L  -    1 Minute Oxygen Saturation % 87 %  -    1 Minute Liters of Oxygen 2 L  -    2 Minute Oxygen Saturation % 86 %  -    2 Minute Liters of Oxygen 2 L  -    3 Minute Oxygen Saturation % 86 %  -    3 Minute Liters of Oxygen 2 L  -    4 Minute Oxygen Saturation % 91 %  -    4 Minute Liters of Oxygen 2 L 3 L    5 Minute Oxygen Saturation % 93 %  -    5 Minute Liters of Oxygen 2 L 3 L    6 Minute Oxygen Saturation % 90 %  -    6 Minute Liters of Oxygen 2 L 3 L    2 Minute Post Oxygen Saturation % 94 %  -    2 Minute Post Liters of Oxygen 2 L 3 L       Oxygen Initial Assessment:     Oxygen Initial Assessment - 05/22/17 1007      Initial 6 min Walk   Oxygen Used Continuous;E-Tanks   Liters per minute 2   Resting Oxygen Saturation  92 %   Exercise Oxygen Saturation  during 6 min walk 86 %  INCREASED TO 3 LITERS     Program Oxygen Prescription   Program Oxygen Prescription Continuous;E-Tanks   Liters per minute 3      Oxygen Re-Evaluation:     Oxygen Re-Evaluation    Row Name 06/02/17 1717 07/01/17 2113 07/24/17 1345         Program Oxygen Prescription   Program Oxygen Prescription Continuous;E-Tanks Continuous;E-Tanks Continuous;E-Tanks     Liters per minute 3 3 3        Home Oxygen   Home Oxygen Device Home Concentrator;Portable Concentrator;E-Tanks Home Concentrator;Portable Concentrator;E-Tanks Home Concentrator;Portable Concentrator;E-Tanks     Sleep Oxygen Prescription Continuous Continuous Continuous     Liters per minute 2 2 2      Home Exercise Oxygen Prescription Continuous Continuous Continuous     Liters per minute 2 3 4      Home at Rest Exercise Oxygen Prescription None None  None     Compliance with Home Oxygen Use Yes Yes Yes       Goals/Expected Outcomes   Short Term Goals To learn and exhibit compliance with exercise, home and travel O2 prescription;To learn and understand importance of monitoring SPO2 with pulse oximeter and demonstrate accurate use of the pulse oximeter.;To Learn and understand importance of maintaining oxygen saturations>88%;To learn and demonstrate proper purse lipped breathing techniques or other breathing techniques.;To learn and demonstrate proper use of respiratory medications To learn and exhibit compliance with exercise, home and travel O2 prescription;To learn and understand importance of monitoring SPO2 with pulse oximeter and demonstrate accurate use of  the pulse oximeter.;To learn and understand importance of maintaining oxygen saturations>88%;To learn and demonstrate proper pursed lip breathing techniques or other breathing techniques.;To learn and demonstrate proper use of respiratory medications To learn and exhibit compliance with exercise, home and travel O2 prescription;To learn and understand importance of monitoring SPO2 with pulse oximeter and demonstrate accurate use of the pulse oximeter.;To learn and understand importance of maintaining oxygen saturations>88%;To learn and demonstrate proper pursed lip breathing techniques or other breathing techniques.;To learn and demonstrate proper use of respiratory medications     Long  Term Goals Exhibits compliance with exercise, home and travel O2 prescription;Verbalizes importance of monitoring SPO2 with pulse oximeter and return demonstration;Maintenance of O2 saturations>88%;Exhibits proper breathing techniques, such as purse lipped breathing or other method taught during program session;Compliance with respiratory medication Exhibits compliance with exercise, home and travel O2 prescription;Verbalizes importance of monitoring SPO2 with pulse oximeter and return  demonstration;Maintenance of O2 saturations>88%;Exhibits proper breathing techniques, such as pursed lip breathing or other method taught during program session;Compliance with respiratory medication Exhibits compliance with exercise, home and travel O2 prescription;Verbalizes importance of monitoring SPO2 with pulse oximeter and return demonstration;Maintenance of O2 saturations>88%;Exhibits proper breathing techniques, such as pursed lip breathing or other method taught during program session;Compliance with respiratory medication     Comments  - goals met patient does not have adequate oxygen for home exercise. will work with MD and oxygen supply company to obtain adequate oxygen for home exercise.        Oxygen Discharge (Final Oxygen Re-Evaluation):     Oxygen Re-Evaluation - 07/24/17 1345      Program Oxygen Prescription   Program Oxygen Prescription Continuous;E-Tanks   Liters per minute 3     Home Oxygen   Home Oxygen Device Home Concentrator;Portable Concentrator;E-Tanks   Sleep Oxygen Prescription Continuous   Liters per minute 2   Home Exercise Oxygen Prescription Continuous   Liters per minute 4   Home at Rest Exercise Oxygen Prescription None   Compliance with Home Oxygen Use Yes     Goals/Expected Outcomes   Short Term Goals To learn and exhibit compliance with exercise, home and travel O2 prescription;To learn and understand importance of monitoring SPO2 with pulse oximeter and demonstrate accurate use of the pulse oximeter.;To learn and understand importance of maintaining oxygen saturations>88%;To learn and demonstrate proper pursed lip breathing techniques or other breathing techniques.;To learn and demonstrate proper use of respiratory medications   Long  Term Goals Exhibits compliance with exercise, home and travel O2 prescription;Verbalizes importance of monitoring SPO2 with pulse oximeter and return demonstration;Maintenance of O2 saturations>88%;Exhibits proper  breathing techniques, such as pursed lip breathing or other method taught during program session;Compliance with respiratory medication   Comments patient does not have adequate oxygen for home exercise. will work with MD and oxygen supply company to obtain adequate oxygen for home exercise.      Initial Exercise Prescription:     Initial Exercise Prescription - 05/22/17 1000      Date of Initial Exercise RX and Referring Provider   Date 05/22/17   Referring Provider Dr. Melvyn Novas     Oxygen   Oxygen Continuous   Liters 3     NuStep   Level 2   Minutes 17   METs 1.5     Arm Ergometer   Level 2   Minutes 17     Track   Laps 10   Minutes 17     Prescription Details   Frequency (times per  week) 2   Duration Progress to 45 minutes of aerobic exercise without signs/symptoms of physical distress     Intensity   THRR 40-80% of Max Heartrate 60-120   Ratings of Perceived Exertion 11-13   Perceived Dyspnea 0-4     Progression   Progression Continue progressive overload as per policy without signs/symptoms or physical distress.     Resistance Training   Training Prescription Yes   Weight blue bands   Reps 10-15      Perform Capillary Blood Glucose checks as needed.  Exercise Prescription Changes:     Exercise Prescription Changes    Row Name 05/28/17 1500 06/02/17 1500 06/04/17 1500 06/09/17 1500 06/16/17 1500     Response to Exercise   Blood Pressure (Admit) 144/64 138/70 164/70 138/60 138/70   Blood Pressure (Exercise) 160/76 168/86 150/78 140/64 168/74   Blood Pressure (Exit) 140/72 140/70 136/64 124/62 138/76   Heart Rate (Admit) 96 bpm 91 bpm 88 bpm 90 bpm 93 bpm   Heart Rate (Exercise) 116 bpm 116 bpm 121 bpm 127 bpm 127 bpm   Heart Rate (Exit) 104 bpm 98 bpm 97 bpm 99 bpm 108 bpm   Oxygen Saturation (Admit) 90 % 91 % 87 % 98 % 93 %   Oxygen Saturation (Exercise) 90 % 93 % 89 % 92 % 88 %   Oxygen Saturation (Exit) 92 % 96 % 96 % 99 % 97 %   Rating of  Perceived Exertion (Exercise) 13 13 17 13 12    Perceived Dyspnea (Exercise) 2 2 3 1 2    Duration Continue with 45 min of aerobic exercise without signs/symptoms of physical distress. Continue with 45 min of aerobic exercise without signs/symptoms of physical distress. Continue with 45 min of aerobic exercise without signs/symptoms of physical distress. Continue with 45 min of aerobic exercise without signs/symptoms of physical distress. Continue with 45 min of aerobic exercise without signs/symptoms of physical distress.   Intensity THRR unchanged THRR unchanged THRR unchanged THRR unchanged THRR unchanged     Progression   Progression Continue to progress workloads to maintain intensity without signs/symptoms of physical distress. Continue to progress workloads to maintain intensity without signs/symptoms of physical distress. Continue to progress workloads to maintain intensity without signs/symptoms of physical distress. Continue to progress workloads to maintain intensity without signs/symptoms of physical distress. Continue to progress workloads to maintain intensity without signs/symptoms of physical distress.     Resistance Training   Training Prescription Yes Yes Yes Yes Yes   Weight blue bands blue bands blue bands blue bands blue bands   Reps 10-15 10-15 10-15 10-15 10-15   Time  -  -  - 10 Minutes 10 Minutes     Oxygen   Oxygen Continuous Continuous Continuous Continuous Continuous   Liters 3 3 3 3 3      NuStep   Level 2 3  - 4 4   Minutes 17 17  - 17 17   METs 1.7 1.9  - 2 1.6     Arm Ergometer   Level  - 2 2 3 2    Minutes  - 17 17 17 17      Track   Laps 9 7 9 9 9    Minutes 17 17 17 17 17    Row Name 06/18/17 1500 06/23/17 1500 06/25/17 1555 07/02/17 1600 07/21/17 1550     Response to Exercise   Blood Pressure (Admit) 134/54 170/74 144/64 118/70 126/62   Blood Pressure (Exercise) 146/60 148/76 160/60 180/70  160/60   Blood Pressure (Exit) 118/60 124/72 144/62 120/62  124/60   Heart Rate (Admit) 89 bpm 101 bpm 80 bpm 85 bpm 92 bpm   Heart Rate (Exercise) 128 bpm 120 bpm 109 bpm 129 bpm 124 bpm   Heart Rate (Exit) 108 bpm 99 bpm 90 bpm 95 bpm 102 bpm   Oxygen Saturation (Admit) 89 % 94 % 95 % 97 % 95 %   Oxygen Saturation (Exercise) 88 % 91 % 93 % 95 % 85 %   Oxygen Saturation (Exit) 94 % 98 % 97 % 99 % 95 %   Rating of Perceived Exertion (Exercise) 12 13 13 11 17    Perceived Dyspnea (Exercise) 1 1 2 1 4    Duration Continue with 45 min of aerobic exercise without signs/symptoms of physical distress. Continue with 45 min of aerobic exercise without signs/symptoms of physical distress. Continue with 45 min of aerobic exercise without signs/symptoms of physical distress. Continue with 45 min of aerobic exercise without signs/symptoms of physical distress. Continue with 45 min of aerobic exercise without signs/symptoms of physical distress.   Intensity THRR unchanged THRR unchanged THRR unchanged THRR unchanged THRR unchanged     Progression   Progression Continue to progress workloads to maintain intensity without signs/symptoms of physical distress. Continue to progress workloads to maintain intensity without signs/symptoms of physical distress. Continue to progress workloads to maintain intensity without signs/symptoms of physical distress. Continue to progress workloads to maintain intensity without signs/symptoms of physical distress. Continue to progress workloads to maintain intensity without signs/symptoms of physical distress.     Resistance Training   Training Prescription Yes Yes Yes Yes Yes   Weight blue bands blue bands blue bands blue bands blue bands   Reps 10-15 10-15 10-15 10-15 10-15   Time 10 Minutes 10 Minutes 10 Minutes 10 Minutes 10 Minutes     Oxygen   Oxygen Continuous Continuous Continuous Continuous Continuous   Liters 3 3 3 3 3      NuStep   Level 4  - 4 4 4    Minutes 17  - 17 17 17    METs 2.1  - 2.4 1.9 2.4     Arm Ergometer    Level 3 3 4   - 4   Minutes 17 17 17   - 17     Track   Laps 11 11  - 11 6   Minutes 17 17  - 17 17   Row Name 07/23/17 1600             Response to Exercise   Blood Pressure (Admit) 124/68       Blood Pressure (Exercise) 162/60       Blood Pressure (Exit) 118/68       Heart Rate (Admit) 89 bpm       Heart Rate (Exercise) 122 bpm       Heart Rate (Exit) 96 bpm       Oxygen Saturation (Admit) 96 %       Oxygen Saturation (Exercise) 92 %       Oxygen Saturation (Exit) 97 %       Rating of Perceived Exertion (Exercise) 13       Perceived Dyspnea (Exercise) 2       Duration Continue with 45 min of aerobic exercise without signs/symptoms of physical distress.       Intensity THRR unchanged         Progression   Progression Continue to progress workloads to maintain intensity  without signs/symptoms of physical distress.         Resistance Training   Training Prescription Yes       Weight blue bands       Reps 10-15       Time 10 Minutes         Oxygen   Oxygen Continuous       Liters 3         NuStep   Level 4       Minutes 17       METs 1.2         Track   Laps 8       Minutes 17          Exercise Comments:   Exercise Goals and Review:   Exercise Goals Re-Evaluation :     Exercise Goals Re-Evaluation    Row Name 05/29/17 1119 06/30/17 0956 07/20/17 1419         Exercise Goal Re-Evaluation   Exercise Goals Review Increase Strenth and Stamina;Increase Physical Activity Increase Strength and Stamina;Increase Physical Activity;Able to understand and use Dyspnea scale;Able to understand and use rate of perceived exertion (RPE) scale;Knowledge and understanding of Target Heart Rate Range (THRR);Understanding of Exercise Prescription Increase Strength and Stamina;Increase Physical Activity;Able to understand and use rate of perceived exertion (RPE) scale;Able to understand and use Dyspnea scale;Knowledge and understanding of Target Heart Rate Range  (THRR);Understanding of Exercise Prescription     Comments Patient has only attended one exercise session. Will cont. to monitor and progress as able.  Patient is progressing well in program. Averages 2.0-2.4 METs. She is open to workload changes. Will cont to progress as able.  Patient has had extended absence due to family issues. Will cont. to monitor and progress as able when patient returns.      Expected Outcomes Through exercising at rehab and at home, patient will increase physicial capacity, strength, and stamina.  Through exercising at rehab and at home, patient will increase physicial capacity, strength, and stamina.  Through exercising at rehab and at home, patient will increase physicial capacity, strength, and stamina.         Discharge Exercise Prescription (Final Exercise Prescription Changes):     Exercise Prescription Changes - 07/23/17 1600      Response to Exercise   Blood Pressure (Admit) 124/68   Blood Pressure (Exercise) 162/60   Blood Pressure (Exit) 118/68   Heart Rate (Admit) 89 bpm   Heart Rate (Exercise) 122 bpm   Heart Rate (Exit) 96 bpm   Oxygen Saturation (Admit) 96 %   Oxygen Saturation (Exercise) 92 %   Oxygen Saturation (Exit) 97 %   Rating of Perceived Exertion (Exercise) 13   Perceived Dyspnea (Exercise) 2   Duration Continue with 45 min of aerobic exercise without signs/symptoms of physical distress.   Intensity THRR unchanged     Progression   Progression Continue to progress workloads to maintain intensity without signs/symptoms of physical distress.     Resistance Training   Training Prescription Yes   Weight blue bands   Reps 10-15   Time 10 Minutes     Oxygen   Oxygen Continuous   Liters 3     NuStep   Level 4   Minutes 17   METs 1.2     Track   Laps 8   Minutes 17      Nutrition:  Target Goals: Understanding of nutrition guidelines, daily intake of sodium <1527m, cholesterol <2082m calories  30% from fat and 7% or less  from saturated fats, daily to have 5 or more servings of fruits and vegetables.  Biometrics:     Pre Biometrics - 05/11/17 1107      Pre Biometrics   Grip Strength 33 kg       Nutrition Therapy Plan and Nutrition Goals:     Nutrition Therapy & Goals - 05/21/17 0829      Nutrition Therapy   Diet General, Healthful     Personal Nutrition Goals   Nutrition Goal Identify food quantities necessary to achieve wt loss of  -2# per week to a goal wt loss of 2.7-10.9 kg (6-24 lb) at graduation from pulmonary rehab.     Intervention Plan   Intervention Prescribe, educate and counsel regarding individualized specific dietary modifications aiming towards targeted core components such as weight, hypertension, lipid management, diabetes, heart failure and other comorbidities.   Expected Outcomes Short Term Goal: Understand basic principles of dietary content, such as calories, fat, sodium, cholesterol and nutrients.;Long Term Goal: Adherence to prescribed nutrition plan.      Nutrition Discharge: Rate Your Plate Scores:     Nutrition Assessments - 05/21/17 0826      Rate Your Plate Scores   Pre Score 54      Nutrition Goals Re-Evaluation:   Nutrition Goals Discharge (Final Nutrition Goals Re-Evaluation):   Psychosocial: Target Goals: Acknowledge presence or absence of significant depression and/or stress, maximize coping skills, provide positive support system. Participant is able to verbalize types and ability to use techniques and skills needed for reducing stress and depression.  Initial Review & Psychosocial Screening:     Initial Psych Review & Screening - 05/11/17 1113      Initial Review   Current issues with History of Depression     Family Dynamics   Good Support System? Yes     Barriers   Psychosocial barriers to participate in program The patient should benefit from training in stress management and relaxation.     Screening Interventions   Interventions  Encouraged to exercise      Quality of Life Scores:   PHQ-9: Recent Review Flowsheet Data    Depression screen [facility] 2/9 05/11/2017 06/20/2016 04/12/2014   Decreased Interest 3 0 0   Down, Depressed, Hopeless 2 0 0   PHQ - 2 Score 5 0 0   Altered sleeping 3 - -   Tired, decreased energy 3 - -   Change in appetite 3 - -   Feeling bad or failure about yourself  0 - -   Trouble concentrating 0 - -   Moving slowly or fidgety/restless 0 - -   Suicidal thoughts 0 - -   PHQ-9 Score 14 - -   Difficult doing work/chores Somewhat difficult - -     Interpretation of Total Score  Total Score Depression Severity:  1-4 = Minimal depression, 5-9 = Mild depression, 10-14 = Moderate depression, 15-19 = Moderately severe depression, 20-27 = Severe depression   Psychosocial Evaluation and Intervention:     Psychosocial Evaluation - 05/11/17 1116      Psychosocial Evaluation & Interventions   Interventions Physician referral;Therapist referral;Encouraged to exercise with the program and follow exercise prescription   Continue Psychosocial Services  Follow up required by counselor      Psychosocial Re-Evaluation:     Psychosocial Re-Evaluation    Row Name 07/01/17 2116 07/24/17 1350           Psychosocial Re-Evaluation  Current issues with Current Depression;History of Depression Current Depression;History of Depression      Comments patient follows up with therapist patient follows up with therapist      Expected Outcomes patient will remain free from psychosocial barriers to participation in pulmonary rehab patient will remain free from psychosocial barriers to participation in pulmonary rehab      Interventions Encouraged to attend Pulmonary Rehabilitation for the exercise Encouraged to attend Pulmonary Rehabilitation for the exercise      Continue Psychosocial Services  No Follow up required No Follow up required         Psychosocial Discharge (Final Psychosocial Re-Evaluation):      Psychosocial Re-Evaluation - 07/24/17 1350      Psychosocial Re-Evaluation   Current issues with Current Depression;History of Depression   Comments patient follows up with therapist   Expected Outcomes patient will remain free from psychosocial barriers to participation in pulmonary rehab   Interventions Encouraged to attend Pulmonary Rehabilitation for the exercise   Continue Psychosocial Services  No Follow up required      Education: Education Goals: Education classes will be provided on a weekly basis, covering required topics. Participant will state understanding/return demonstration of topics presented.  Learning Barriers/Preferences:     Learning Barriers/Preferences - 05/11/17 1102      Learning Barriers/Preferences   Learning Barriers None   Learning Preferences Video;Pictoral;Audio;Computer/Internet      Education Topics: Risk Factor Reduction:  -Group instruction that is supported by a PowerPoint presentation. Instructor discusses the definition of a risk factor, different risk factors for pulmonary disease, and how the heart and lungs work together.     PULMONARY REHAB OTHER RESPIRATORY from 07/23/2017 in [place]  Date  05/28/17  Educator  ep  Instruction Review Code  2- meets goals/outcomes      Nutrition for Pulmonary Patient:  -Group instruction provided by PowerPoint slides, verbal discussion, and written materials to support subject matter. The instructor gives an explanation and review of healthy diet recommendations, which includes a discussion on weight management, recommendations for fruit and vegetable consumption, as well as protein, fluid, caffeine, fiber, sodium, sugar, and alcohol. Tips for eating when patients are short of breath are discussed.   PULMONARY REHAB OTHER RESPIRATORY from 07/23/2017 in [place]  Date  06/25/17  Educator  edna  Instruction Review Code  2- meets  goals/outcomes      Pursed Lip Breathing:  -Group instruction that is supported by demonstration and informational handouts. Instructor discusses the benefits of pursed lip and diaphragmatic breathing and detailed demonstration on how to preform both.     Oxygen Safety:  -Group instruction provided by PowerPoint, verbal discussion, and written material to support subject matter. There is an overview of "What is Oxygen" and "Why do we need it".  Instructor also reviews how to create a safe environment for oxygen use, the importance of using oxygen as prescribed, and the risks of noncompliance. There is a brief discussion on traveling with oxygen and resources the patient may utilize.   Oxygen Equipment:  -Group instruction provided by [facility] Staff utilizing handouts, written materials, and equipment demonstrations.   Signs and Symptoms:  -Group instruction provided by written material and verbal discussion to support subject matter. Warning signs and symptoms of infection, stroke, and heart attack are reviewed and when to call the physician/911 reinforced. Tips for preventing the spread of infection discussed.   PULMONARY REHAB OTHER RESPIRATORY from  07/23/2017 in [place]  Date  07/23/17  Educator  rn  Instruction Review Code  2- meets goals/outcomes      Advanced Directives:  -Group instruction provided by verbal instruction and written material to support subject matter. Instructor reviews Advanced Directive laws and proper instruction for filling out document.   Pulmonary Video:  -Group video education that reviews the importance of medication and oxygen compliance, exercise, good nutrition, pulmonary hygiene, and pursed lip and diaphragmatic breathing for the pulmonary patient.   Exercise for the Pulmonary Patient:  -Group instruction that is supported by a PowerPoint presentation. Instructor discusses benefits of exercise, core components  of exercise, frequency, duration, and intensity of an exercise routine, importance of utilizing pulse oximetry during exercise, safety while exercising, and options of places to exercise outside of rehab.     Pulmonary Medications:  -Verbally interactive group education provided by instructor with focus on inhaled medications and proper administration.   PULMONARY REHAB OTHER RESPIRATORY from 07/23/2017 in [place]  Date  07/02/17  Educator  pharmacist  Instruction Review Code  2- meets goals/outcomes      Anatomy and Physiology of the Respiratory System and Intimacy:  -Group instruction provided by PowerPoint, verbal discussion, and written material to support subject matter. Instructor reviews respiratory cycle and anatomical components of the respiratory system and their functions. Instructor also reviews differences in obstructive and restrictive respiratory diseases with examples of each. Intimacy, Sex, and Sexuality differences are reviewed with a discussion on how relationships can change when diagnosed with pulmonary disease. Common sexual concerns are reviewed.   MD DAY -A group question and answer session with a medical doctor that allows participants to ask questions that relate to their pulmonary disease state.   OTHER EDUCATION -Group or individual verbal, written, or video instructions that support the educational goals of the pulmonary rehab program.   Knowledge Questionnaire Score:   Core Components/Risk Factors/Patient Goals at Admission:     Personal Goals and Risk Factors at Admission - 05/11/17 1108      Core Components/Risk Factors/Patient Goals on Admission    Weight Management Yes   Intervention Weight Management: Develop a combined nutrition and exercise program designed to reach desired caloric intake, while maintaining appropriate intake of nutrient and fiber, sodium and fats, and appropriate energy expenditure required for  the weight goal.   Admit Weight 213 lb 3 oz (96.7 kg)   Goal Weight: Short Term 202 lb 13.2 oz (92 kg)   Goal Weight: Long Term 203 lb (92.1 kg)   Expected Outcomes Short Term: Continue to assess and modify interventions until short term weight is achieved   Improve shortness of breath with ADL's Yes   Intervention Provide education, individualized exercise plan and daily activity instruction to help decrease symptoms of SOB with activities of daily living.   Expected Outcomes Short Term: Achieves a reduction of symptoms when performing activities of daily living.   Develop more efficient breathing techniques such as purse lipped breathing and diaphragmatic breathing; and practicing self-pacing with activity Yes   Intervention Provide education, demonstration and support about specific breathing techniuqes utilized for more efficient breathing. Include techniques such as pursed lipped breathing, diaphragmatic breathing and self-pacing activity.   Expected Outcomes Short Term: Participant will be able to demonstrate and use breathing techniques as needed throughout daily activities.   Stress Yes   Intervention Offer individual and/or small group education and counseling on adjustment to heart disease,  stress management and health-related lifestyle change. Teach and support self-help strategies.   Expected Outcomes Short Term: Participant demonstrates changes in health-related behavior, relaxation and other stress management skills, ability to obtain effective social support, and compliance with psychotropic medications if prescribed.      Core Components/Risk Factors/Patient Goals Review:      Goals and Risk Factor Review    Row Name 06/02/17 1718 07/01/17 2115 07/24/17 1349         Core Components/Risk Factors/Patient Goals Review   Personal Goals Review  - Weight Management/Obesity;Improve shortness of breath with ADL's;Develop more efficient breathing techniques such as purse lipped  breathing and diaphragmatic breathing and practicing self-pacing with activity. Weight Management/Obesity;Improve shortness of breath with ADL's;Develop more efficient breathing techniques such as purse lipped breathing and diaphragmatic breathing and practicing self-pacing with activity.     Review patient has only attended 2 sessions since admission and too soon to evaluate progress towards goals patient has mastered the purse lip breathing technique and is often observed using the technique to reduce her shortness of breath. she is not loosing weight but admits to not changing her caloric intake. patient has mastered the purse lip breathing technique and is often observed using the technique to reduce her shortness of breath. she is not loosing weight but admits to not changing her caloric intake.     Expected Outcomes see admission expected outcomes see admission expected outcomes see admission expected outcomes        Core Components/Risk Factors/Patient Goals at Discharge (Final Review):      Goals and Risk Factor Review - 07/24/17 1349      Core Components/Risk Factors/Patient Goals Review   Personal Goals Review Weight Management/Obesity;Improve shortness of breath with ADL's;Develop more efficient breathing techniques such as purse lipped breathing and diaphragmatic breathing and practicing self-pacing with activity.   Review patient has mastered the purse lip breathing technique and is often observed using the technique to reduce her shortness of breath. she is not loosing weight but admits to not changing her caloric intake.   Expected Outcomes see admission expected outcomes      ITP Comments:   Comments: ITP REVIEW Pt is making expected progress toward pulmonary rehab goals after completing 11 sessions. Recommend continued exercise, life style modification, education, and utilization of breathing techniques to increase stamina and strength and decrease shortness of breath with  exertion.

## 2017-07-28 ENCOUNTER — Encounter (HOSPITAL_COMMUNITY)
Admission: RE | Admit: 2017-07-28 | Discharge: 2017-07-28 | Disposition: A | Discharge status: Home / Self Care | Payer: Medicare Other | Source: Ambulatory Visit | Attending: Internal Medicine | Admitting: Internal Medicine

## 2017-07-28 VITALS — Wt 207.5 lb

## 2017-07-28 DIAGNOSIS — Z7982 Long term (current) use of aspirin: Secondary | ICD-10-CM | POA: Diagnosis not present

## 2017-07-28 DIAGNOSIS — J8410 Pulmonary fibrosis, unspecified: Secondary | ICD-10-CM | POA: Diagnosis not present

## 2017-07-28 DIAGNOSIS — J449 Chronic obstructive pulmonary disease, unspecified: Secondary | ICD-10-CM | POA: Diagnosis not present

## 2017-07-28 DIAGNOSIS — J9610 Chronic respiratory failure, unspecified whether with hypoxia or hypercapnia: Secondary | ICD-10-CM | POA: Diagnosis not present

## 2017-07-28 DIAGNOSIS — Z7951 Long term (current) use of inhaled steroids: Secondary | ICD-10-CM | POA: Diagnosis not present

## 2017-07-28 DIAGNOSIS — Z79899 Other long term (current) drug therapy: Secondary | ICD-10-CM | POA: Diagnosis not present

## 2017-07-28 NOTE — Progress Notes (Signed)
Daily Session Note  Patient Details  Name: [name] MRN: [id number] Date of Birth: 04/12/1946 Referring Provider:     Pulmonary Rehab Walk Test from 05/21/2017 in [place]  Referring Provider  Dr. Melvyn Novas      Encounter Date: 07/28/2017  Check In:     Session Check In - 07/28/17 1330      Check-In   Location [facility]   Staff Present Rodney Langton, RN;Molly diVincenzo, MS, ACSM RCEP, Exercise Physiologist;Portia Rollene Rotunda, RN, BSN   Supervising physician immediately available to respond to emergencies [facility] immediately available   Physician(s) Dr. Wynelle Cleveland   Medication changes reported     No   Fall or balance concerns reported    No   Tobacco Cessation No Change   Warm-up and Cool-down Performed as group-led instruction   Resistance Training Performed Yes   VAD Patient? No     Pain Assessment   Currently in Pain? No/denies   Multiple Pain Sites No      Capillary Blood Glucose: No results found for this or any previous visit (from the past 24 hour(s)).      Exercise Prescription Changes - 07/28/17 1500      Response to Exercise   Blood Pressure (Admit) 140/64   Blood Pressure (Exercise) 134/70   Blood Pressure (Exit) 120/66   Heart Rate (Admit) 91 bpm   Heart Rate (Exercise) 125 bpm   Heart Rate (Exit) 98 bpm   Oxygen Saturation (Admit) 93 %   Oxygen Saturation (Exercise) 93 %   Oxygen Saturation (Exit) 97 %   Rating of Perceived Exertion (Exercise) 13   Perceived Dyspnea (Exercise) 3   Duration Continue with 45 min of aerobic exercise without signs/symptoms of physical distress.   Intensity THRR unchanged     Progression   Progression Continue to progress workloads to maintain intensity without signs/symptoms of physical distress.     Resistance Training   Training Prescription Yes   Weight blue bands   Reps 10-15   Time 10 Minutes     Oxygen   Oxygen Continuous   Liters 3-4     NuStep   Level 4   Minutes 17   METs 2.2     Arm Ergometer   Level 4   Minutes 17     Track   Laps 8   Minutes 17      History  Smoking Status  . Former Smoker  . Packs/day: 1.50  . Years: 58.50  . Types: Cigarettes  . Quit date: 03/03/2005  Smokeless Tobacco  . Never Used    Goals Met:  Exercise tolerated well No report of cardiac concerns or symptoms Strength training completed today  Goals Unmet:  Not Applicable  Comments: Service time is from 1330 to 1455    Dr. Rush Farmer is Medical Director for Pulmonary Rehab at [facility].

## 2017-07-28 NOTE — Progress Notes (Signed)
I have reviewed a Home Exercise Prescription with [name] . [name] is not currently exercising at home.  The patient was advised to walk 2-3 days a week for 30 minutes.  [place] and I discussed how to progress their exercise prescription.  The patient stated that their goals were to increase energy, lose weight, and change eating habits.  The patient stated that they understand the exercise prescription.  We reviewed exercise guidelines, target heart rate during exercise, oxygen use, weather, home pulse oximeter, endpoints for exercise, and goals.  Patient is encouraged to come to me with any questions. I will continue to follow up with the patient to assist them with progression and safety.

## 2017-07-30 ENCOUNTER — Encounter (HOSPITAL_COMMUNITY)
Admission: RE | Admit: 2017-07-30 | Discharge: 2017-07-30 | Disposition: A | Discharge status: Home / Self Care | Payer: Medicare Other | Source: Ambulatory Visit | Attending: Internal Medicine | Admitting: Internal Medicine

## 2017-07-30 VITALS — Wt 207.0 lb

## 2017-07-30 DIAGNOSIS — Z7951 Long term (current) use of inhaled steroids: Secondary | ICD-10-CM | POA: Diagnosis not present

## 2017-07-30 DIAGNOSIS — J449 Chronic obstructive pulmonary disease, unspecified: Secondary | ICD-10-CM | POA: Diagnosis not present

## 2017-07-30 DIAGNOSIS — J8410 Pulmonary fibrosis, unspecified: Secondary | ICD-10-CM | POA: Diagnosis not present

## 2017-07-30 DIAGNOSIS — Z7982 Long term (current) use of aspirin: Secondary | ICD-10-CM | POA: Diagnosis not present

## 2017-07-30 DIAGNOSIS — J9610 Chronic respiratory failure, unspecified whether with hypoxia or hypercapnia: Secondary | ICD-10-CM | POA: Diagnosis not present

## 2017-07-30 DIAGNOSIS — Z79899 Other long term (current) drug therapy: Secondary | ICD-10-CM | POA: Diagnosis not present

## 2017-07-30 NOTE — Progress Notes (Signed)
Daily Session Note  Patient Details  Name: [name] MRN: [id number] Date of Birth: 06-13-1946 Referring Provider:     Pulmonary Rehab Walk Test from 05/21/2017 in [place]  Referring Provider  Dr. Melvyn Novas      Encounter Date: 07/30/2017  Check In:     Session Check In - 07/30/17 1338      Check-In   [facility]   Staff Present Su Hilt, MS, ACSM RCEP, Exercise Physiologist;Portia Rollene Rotunda, RN, Roque Cash, RN   Supervising physician immediately available to respond to emergencies [facility] immediately available   Physician(s) Dr. Wendee Beavers   Medication changes reported     No   Fall or balance concerns reported    No   Tobacco Cessation No Change   Warm-up and Cool-down Performed as group-led instruction   Resistance Training Performed Yes   VAD Patient? No     Pain Assessment   Currently in Pain? No/denies   Multiple Pain Sites No      Capillary Blood Glucose: No results found for this or any previous visit (from the past 24 hour(s)).      Exercise Prescription Changes - 07/30/17 1500      Response to Exercise   Blood Pressure (Admit) 150/72   Blood Pressure (Exercise) 166/62   Blood Pressure (Exit) 131/79   Heart Rate (Admit) 87 bpm   Heart Rate (Exercise) 111 bpm   Heart Rate (Exit) 90 bpm   Oxygen Saturation (Admit) 96 %   Oxygen Saturation (Exercise) 92 %   Oxygen Saturation (Exit) 97 %   Rating of Perceived Exertion (Exercise) 13   Perceived Dyspnea (Exercise) 2   Duration Continue with 45 min of aerobic exercise without signs/symptoms of physical distress.   Intensity THRR unchanged     Progression   Progression Continue to progress workloads to maintain intensity without signs/symptoms of physical distress.     Resistance Training   Training Prescription Yes   Weight blue bands   Reps 10-15   Time 10 Minutes     Oxygen   Oxygen Continuous   Liters 3-4     NuStep   Level 4   Minutes 17   METs 2.2     Arm Ergometer   Level 5   Minutes 17      History  Smoking Status  . Former Smoker  . Packs/day: 1.50  . Years: 58.50  . Types: Cigarettes  . Quit date: 03/03/2005  Smokeless Tobacco  . Never Used    Goals Met:  Exercise tolerated well No report of cardiac concerns or symptoms Strength training completed today  Goals Unmet:  Not Applicable  Comments: Service time is from 1330 to 1505    Dr. Rush Farmer is Medical Director for Pulmonary Rehab at [facility]

## 2017-08-04 ENCOUNTER — Encounter (HOSPITAL_COMMUNITY)
Admission: RE | Admit: 2017-08-04 | Discharge: 2017-08-04 | Disposition: A | Discharge status: Home / Self Care | Payer: Medicare Other | Source: Ambulatory Visit | Attending: Internal Medicine | Admitting: Internal Medicine

## 2017-08-04 DIAGNOSIS — Z79899 Other long term (current) drug therapy: Secondary | ICD-10-CM | POA: Insufficient documentation

## 2017-08-04 DIAGNOSIS — K219 Gastro-esophageal reflux disease without esophagitis: Secondary | ICD-10-CM | POA: Diagnosis not present

## 2017-08-04 DIAGNOSIS — E039 Hypothyroidism, unspecified: Secondary | ICD-10-CM | POA: Insufficient documentation

## 2017-08-04 DIAGNOSIS — M1711 Unilateral primary osteoarthritis, right knee: Secondary | ICD-10-CM | POA: Insufficient documentation

## 2017-08-04 DIAGNOSIS — Z87891 Personal history of nicotine dependence: Secondary | ICD-10-CM | POA: Diagnosis not present

## 2017-08-04 DIAGNOSIS — Z7982 Long term (current) use of aspirin: Secondary | ICD-10-CM | POA: Insufficient documentation

## 2017-08-04 DIAGNOSIS — I252 Old myocardial infarction: Secondary | ICD-10-CM | POA: Insufficient documentation

## 2017-08-04 DIAGNOSIS — Z7951 Long term (current) use of inhaled steroids: Secondary | ICD-10-CM | POA: Insufficient documentation

## 2017-08-04 DIAGNOSIS — I10 Essential (primary) hypertension: Secondary | ICD-10-CM | POA: Insufficient documentation

## 2017-08-04 DIAGNOSIS — J9610 Chronic respiratory failure, unspecified whether with hypoxia or hypercapnia: Secondary | ICD-10-CM | POA: Insufficient documentation

## 2017-08-04 DIAGNOSIS — J449 Chronic obstructive pulmonary disease, unspecified: Secondary | ICD-10-CM | POA: Diagnosis not present

## 2017-08-04 DIAGNOSIS — L4050 Arthropathic psoriasis, unspecified: Secondary | ICD-10-CM | POA: Insufficient documentation

## 2017-08-04 DIAGNOSIS — J8410 Pulmonary fibrosis, unspecified: Secondary | ICD-10-CM | POA: Diagnosis not present

## 2017-08-04 NOTE — Progress Notes (Signed)
Daily Session Note  Patient Details  Name: [name] MRN: [id number] Date of Birth: 1946-06-12 Referring Provider:     Pulmonary Rehab Walk Test from 05/21/2017 in [place]  Referring Provider  Dr. Melvyn Novas      Encounter Date: 08/04/2017  Check In:     Session Check In - 08/04/17 1533      Check-In   [facility]   Staff Present Su Hilt, MS, ACSM RCEP, Exercise Physiologist;Kosisochukwu [place] Ysidro Evert, RN   Supervising physician immediately available to respond to emergencies [facility] immediately available   Physician(s) Dr. Wendee Beavers   Medication changes reported     No   Fall or balance concerns reported    No   Tobacco Cessation No Change   Warm-up and Cool-down Performed as group-led instruction   Resistance Training Performed Yes   VAD Patient? No     Pain Assessment   Currently in Pain? No/denies   Multiple Pain Sites No      Capillary Blood Glucose: No results found for this or any previous visit (from the past 24 hour(s)).      Exercise Prescription Changes - 08/04/17 1600      Response to Exercise   Blood Pressure (Admit) 134/62   Blood Pressure (Exercise) 140/64   Blood Pressure (Exit) 124/56   Heart Rate (Admit) 93 bpm   Heart Rate (Exercise) 130 bpm   Heart Rate (Exit) 107 bpm   Oxygen Saturation (Admit) 96 %   Oxygen Saturation (Exercise) 90 %   Oxygen Saturation (Exit) 98 %   Rating of Perceived Exertion (Exercise) 14   Perceived Dyspnea (Exercise) 1   Duration Continue with 45 min of aerobic exercise without signs/symptoms of physical distress.   Intensity THRR unchanged     Progression   Progression Continue to progress workloads to maintain intensity without signs/symptoms of physical distress.     Resistance Training   Training Prescription Yes   Weight blue bands   Reps 10-15   Time 10 Minutes     Oxygen   Oxygen Continuous   Liters 3-4     NuStep   Level 5   Minutes 17   METs 2.4     Arm Ergometer   Level 5   Minutes 17     Track   Laps 8   Minutes 17      History  Smoking Status  . Former Smoker  . Packs/day: 1.50  . Years: 58.50  . Types: Cigarettes  . Quit date: 03/03/2005  Smokeless Tobacco  . Never Used    Goals Met:  Exercise tolerated well No report of cardiac concerns or symptoms Strength training completed today  Goals Unmet:  Not Applicable  Comments: Service time is from 1330 to 1505    Dr. Rush Farmer is Medical Director for Pulmonary Rehab at [facility].

## 2017-08-05 ENCOUNTER — Telehealth: Payer: Self-pay | Admitting: Cardiology

## 2017-08-05 ENCOUNTER — Ambulatory Visit: Payer: Medicare Other | Admitting: Cardiology

## 2017-08-05 NOTE — Telephone Encounter (Signed)
I called and spoke with the patient. She stated she was scheduled to see Dr. Delton See this morning but woke up very nauseated and sick on her stomach.  She just wanted to call and apologize to [facility] for missing her appointment.  She has rescheduled for January 2019 with Dr. Delton See.  She feels she is ok to wait until then. I advised her to call back if she feels she cannot wait until January. Otherwise will forward to [facility] as an [place].

## 2017-08-05 NOTE — Telephone Encounter (Signed)
New message     Pt cancelled her appt because she is sick, but she wants to speak to you about the appointment.

## 2017-08-06 ENCOUNTER — Encounter (HOSPITAL_COMMUNITY)
Admission: RE | Admit: 2017-08-06 | Discharge: 2017-08-06 | Disposition: A | Discharge status: Home / Self Care | Payer: Medicare Other | Source: Ambulatory Visit | Attending: Internal Medicine | Admitting: Internal Medicine

## 2017-08-06 VITALS — Wt 208.8 lb

## 2017-08-06 DIAGNOSIS — J8410 Pulmonary fibrosis, unspecified: Secondary | ICD-10-CM | POA: Diagnosis not present

## 2017-08-06 DIAGNOSIS — Z7951 Long term (current) use of inhaled steroids: Secondary | ICD-10-CM | POA: Diagnosis not present

## 2017-08-06 DIAGNOSIS — J449 Chronic obstructive pulmonary disease, unspecified: Secondary | ICD-10-CM | POA: Diagnosis not present

## 2017-08-06 DIAGNOSIS — Z79899 Other long term (current) drug therapy: Secondary | ICD-10-CM | POA: Diagnosis not present

## 2017-08-06 DIAGNOSIS — J9610 Chronic respiratory failure, unspecified whether with hypoxia or hypercapnia: Secondary | ICD-10-CM | POA: Diagnosis not present

## 2017-08-06 DIAGNOSIS — Z7982 Long term (current) use of aspirin: Secondary | ICD-10-CM | POA: Diagnosis not present

## 2017-08-06 NOTE — Progress Notes (Signed)
Daily Session Note  Patient Details  Name: [name] MRN: [id number] Date of Birth: 05/17/1946 Referring Provider:     Pulmonary Rehab Walk Test from 05/21/2017 in [place]  Referring Provider  Dr. Melvyn Novas      Encounter Date: 08/06/2017  Check In:     Session Check In - 08/06/17 1339      Check-In   [facility]   Staff Present Rodney Langton, RN;Anette Barra, MS, ACSM RCEP, Exercise Physiologist   Supervising physician immediately available to respond to emergencies [facility] immediately available   Physician(s) Dr. Zigmund Daniel   Medication changes reported     No   Fall or balance concerns reported    No   Tobacco Cessation No Change   Warm-up and Cool-down Performed as group-led instruction   Resistance Training Performed Yes   VAD Patient? No     Pain Assessment   Currently in Pain? No/denies   Multiple Pain Sites No      Capillary Blood Glucose: No results found for this or any previous visit (from the past 24 hour(s)).      Exercise Prescription Changes - 08/06/17 1600      Response to Exercise   Blood Pressure (Admit) 124/60   Blood Pressure (Exercise) 144/70   Blood Pressure (Exit) 104/60   Heart Rate (Admit) 90 bpm   Heart Rate (Exercise) 135 bpm   Heart Rate (Exit) 107 bpm   Oxygen Saturation (Admit) 96 %   Oxygen Saturation (Exercise) 92 %   Oxygen Saturation (Exit) 95 %   Rating of Perceived Exertion (Exercise) 15   Perceived Dyspnea (Exercise) 3   Duration Continue with 45 min of aerobic exercise without signs/symptoms of physical distress.   Intensity THRR unchanged     Progression   Progression Continue to progress workloads to maintain intensity without signs/symptoms of physical distress.     Resistance Training   Training Prescription Yes   Weight blue bands   Reps 10-15   Time 10 Minutes     Oxygen   Oxygen Continuous   Liters 3-4     NuStep   Level 6   Minutes  17   METs 2.7     Track   Laps 10   Minutes 17      History  Smoking Status  . Former Smoker  . Packs/day: 1.50  . Years: 58.50  . Types: Cigarettes  . Quit date: 03/03/2005  Smokeless Tobacco  . Never Used    Goals Met:  Exercise tolerated well No report of cardiac concerns or symptoms Strength training completed today  Goals Unmet:  Not Applicable  Comments: Service time is from 1:30p to 3:15p    Dr. Rush Farmer is Medical Director for Pulmonary Rehab at [facility].

## 2017-08-11 ENCOUNTER — Encounter (HOSPITAL_COMMUNITY): Payer: Medicare Other

## 2017-08-13 ENCOUNTER — Encounter (HOSPITAL_COMMUNITY): Payer: Medicare Other

## 2017-08-17 DIAGNOSIS — M0589 Other rheumatoid arthritis with rheumatoid factor of multiple sites: Secondary | ICD-10-CM | POA: Diagnosis not present

## 2017-08-17 DIAGNOSIS — Z79899 Other long term (current) drug therapy: Secondary | ICD-10-CM | POA: Diagnosis not present

## 2017-08-18 ENCOUNTER — Encounter (HOSPITAL_COMMUNITY)
Admission: RE | Admit: 2017-08-18 | Discharge: 2017-08-18 | Disposition: A | Discharge status: Home / Self Care | Payer: Medicare Other | Source: Ambulatory Visit | Attending: Internal Medicine | Admitting: Internal Medicine

## 2017-08-18 VITALS — Wt 209.2 lb

## 2017-08-18 DIAGNOSIS — Z7951 Long term (current) use of inhaled steroids: Secondary | ICD-10-CM | POA: Diagnosis not present

## 2017-08-18 DIAGNOSIS — J8410 Pulmonary fibrosis, unspecified: Secondary | ICD-10-CM

## 2017-08-18 DIAGNOSIS — J9610 Chronic respiratory failure, unspecified whether with hypoxia or hypercapnia: Secondary | ICD-10-CM | POA: Diagnosis not present

## 2017-08-18 DIAGNOSIS — J449 Chronic obstructive pulmonary disease, unspecified: Secondary | ICD-10-CM | POA: Diagnosis not present

## 2017-08-18 DIAGNOSIS — Z7982 Long term (current) use of aspirin: Secondary | ICD-10-CM | POA: Diagnosis not present

## 2017-08-18 DIAGNOSIS — Z79899 Other long term (current) drug therapy: Secondary | ICD-10-CM | POA: Diagnosis not present

## 2017-08-18 NOTE — Progress Notes (Signed)
Daily Session Note  Patient Details  Name: [name] MRN: [id number] Date of Birth: 09-29-1946 Referring Provider:     Pulmonary Rehab Walk Test from 05/21/2017 in [place]  Referring Provider  Dr. Melvyn Novas      Encounter Date: 08/18/2017  Check In:     Session Check In - 08/18/17 1336      Check-In   [facility]   Staff Present Su Hilt, MS, ACSM RCEP, Exercise Physiologist;Alfonzia Woolum Ysidro Evert, RN;Portia Rollene Rotunda, RN, BSN   Supervising physician immediately available to respond to emergencies [facility] immediately available   Physician(s) Dr. Verlon Au   Medication changes reported     No   Fall or balance concerns reported    No   Tobacco Cessation No Change   Warm-up and Cool-down Performed as group-led instruction   Resistance Training Performed Yes   VAD Patient? No     Pain Assessment   Currently in Pain? No/denies   Multiple Pain Sites No      Capillary Blood Glucose: No results found for this or any previous visit (from the past 24 hour(s)).      Exercise Prescription Changes - 08/18/17 1500      Response to Exercise   Blood Pressure (Admit) 168/70   Blood Pressure (Exercise) 140/60   Blood Pressure (Exit) 134/62   Heart Rate (Admit) 106 bpm   Heart Rate (Exercise) 115 bpm   Heart Rate (Exit) 97 bpm   Oxygen Saturation (Admit) 91 %   Oxygen Saturation (Exercise) 92 %   Oxygen Saturation (Exit) 96 %   Rating of Perceived Exertion (Exercise) 13   Perceived Dyspnea (Exercise) 2   Duration Continue with 45 min of aerobic exercise without signs/symptoms of physical distress.   Intensity THRR unchanged     Progression   Progression Continue to progress workloads to maintain intensity without signs/symptoms of physical distress.     Resistance Training   Training Prescription Yes   Weight blue bands   Reps 10-15   Time 10 Minutes     Oxygen   Oxygen Continuous   Liters 3.-4     NuStep    Level 6   Minutes 17   METs 2.3     Arm Ergometer   Level 6   Minutes 34      History  Smoking Status  . Former Smoker  . Packs/day: 1.50  . Years: 58.50  . Types: Cigarettes  . Quit date: 03/03/2005  Smokeless Tobacco  . Never Used    Goals Met:  Exercise tolerated well No report of cardiac concerns or symptoms Strength training completed today  Goals Unmet:  Not Applicable  Comments: Service time is from 1330 to 1505    Dr. Rush Farmer is Medical Director for Pulmonary Rehab at [facility].

## 2017-08-20 ENCOUNTER — Encounter (HOSPITAL_COMMUNITY)
Admission: RE | Admit: 2017-08-20 | Discharge: 2017-08-20 | Disposition: A | Discharge status: Home / Self Care | Payer: Medicare Other | Source: Ambulatory Visit | Attending: Internal Medicine | Admitting: Internal Medicine

## 2017-08-20 VITALS — Wt 207.9 lb

## 2017-08-20 DIAGNOSIS — J9610 Chronic respiratory failure, unspecified whether with hypoxia or hypercapnia: Secondary | ICD-10-CM | POA: Diagnosis not present

## 2017-08-20 DIAGNOSIS — J8410 Pulmonary fibrosis, unspecified: Secondary | ICD-10-CM | POA: Diagnosis not present

## 2017-08-20 DIAGNOSIS — Z79899 Other long term (current) drug therapy: Secondary | ICD-10-CM | POA: Diagnosis not present

## 2017-08-20 DIAGNOSIS — J449 Chronic obstructive pulmonary disease, unspecified: Secondary | ICD-10-CM | POA: Diagnosis not present

## 2017-08-20 DIAGNOSIS — Z7951 Long term (current) use of inhaled steroids: Secondary | ICD-10-CM | POA: Diagnosis not present

## 2017-08-20 DIAGNOSIS — Z7982 Long term (current) use of aspirin: Secondary | ICD-10-CM | POA: Diagnosis not present

## 2017-08-20 NOTE — Progress Notes (Signed)
Daily Session Note  Patient Details  Name: [name] MRN: [id number] Date of Birth: Apr 24, 1946 Referring Provider:     Pulmonary Rehab Walk Test from 05/21/2017 in [place]  Referring Provider  Dr. Melvyn Novas      Encounter Date: 08/20/2017  Check In:     Session Check In - 08/20/17 1333      Check-In   Location [facility]   Staff Present Su Hilt, MS, ACSM RCEP, Exercise Physiologist;Lisa Ysidro Evert, RN;Aleyah Balik Rollene Rotunda, RN, BSN   Supervising physician immediately available to respond to emergencies [facility] immediately available   Physician(s) Dr. Horris Latino   Medication changes reported     No   Fall or balance concerns reported    No   Tobacco Cessation No Change   Warm-up and Cool-down Performed as group-led instruction   Resistance Training Performed Yes   VAD Patient? No     Pain Assessment   Currently in Pain? No/denies   Multiple Pain Sites No      Capillary Blood Glucose: No results found for this or any previous visit (from the past 24 hour(s)).      Exercise Prescription Changes - 08/20/17 1554      Response to Exercise   Blood Pressure (Admit) 150/64   Blood Pressure (Exercise) 170/62   Blood Pressure (Exit) 138/80   Heart Rate (Admit) 76 bpm   Heart Rate (Exercise) 124 bpm   Heart Rate (Exit) 97 bpm   Oxygen Saturation (Admit) 93 %   Oxygen Saturation (Exercise) 87 %   Oxygen Saturation (Exit) 96 %   Rating of Perceived Exertion (Exercise) 13   Perceived Dyspnea (Exercise) 3   Duration Continue with 45 min of aerobic exercise without signs/symptoms of physical distress.   Intensity THRR unchanged     Progression   Progression Continue to progress workloads to maintain intensity without signs/symptoms of physical distress.     Resistance Training   Training Prescription Yes   Weight blue bands   Reps 10-15   Time 10 Minutes     Oxygen   Oxygen Continuous   Liters 3.-4     Arm  Ergometer   Level 6   Minutes 17     Track   Laps 9   Minutes 17      History  Smoking Status  . Former Smoker  . Packs/day: 1.50  . Years: 58.50  . Types: Cigarettes  . Quit date: 03/03/2005  Smokeless Tobacco  . Never Used    Goals Met:  Independence with exercise equipment Improved SOB with ADL's Using PLB without cueing & demonstrates good technique Exercise tolerated well No report of cardiac concerns or symptoms Strength training completed today  Goals Unmet:  Not Applicable  Comments: Service time is from 1330 to 1520   Dr. Rush Farmer is Medical Director for Pulmonary Rehab at [facility].

## 2017-08-24 NOTE — Progress Notes (Signed)
Pulmonary Individual Treatment Plan  Patient Details  Name: AKSHITA ITALIANO MRN: [id number] Date of Birth: [birth year] Referring Provider:     Pulmonary Rehab Walk Test from 05/21/2017 in [facility]  Referring Provider  Dr. Melvyn Novas      Initial Encounter Date:    Pulmonary Rehab Walk Test from 05/21/2017 in [facility]  Date  05/22/17  Referring Provider  Dr. Melvyn Novas      Visit Diagnosis: Pulmonary fibrosis ([place])  Patient's Home Medications on Admission:   Current Outpatient Prescriptions:  .  acetaminophen (TYLENOL ARTHRITIS PAIN) 650 MG CR tablet, Take 1,300 mg by mouth every 8 (eight) hours as needed for pain. Per bottle directions as needed, Disp: , Rfl:  .  albuterol (PROAIR HFA) 108 (90 Base) MCG/ACT inhaler, 2 puffs every 4 hours as needed only  if your can't catch your breath, Disp: 1 Inhaler, Rfl: 11 .  aspirin EC 325 MG EC tablet, Take 1 tablet (325 mg total) by mouth daily., Disp: 30 tablet, Rfl: 0 .  atorvastatin (LIPITOR) 80 MG tablet, Take 1 tablet (80 mg total) by mouth daily at 6 PM., Disp: 90 tablet, Rfl: 3 .  budesonide-formoterol (SYMBICORT) 160-4.5 MCG/ACT inhaler, Inhale 2 puffs into the lungs 2 (two) times daily., Disp: 3 Inhaler, Rfl: 6 .  Certolizumab Pegol (CIMZIA [place]), Inject into the skin as directed. Cimzia, Injected by provider as directed, Disp: , Rfl:  .  Cholecalciferol (VITAMIN D3) 2000 UNITS TABS, Take 2,000 Int'l Units by mouth daily., Disp: , Rfl:  .  citalopram (CELEXA) 40 MG tablet, Take 1 tablet (40 mg total) by mouth every morning., Disp: 90 tablet, Rfl: 0 .  famotidine (PEPCID) 20 MG tablet, One at bedtime, Disp: , Rfl:  .  furosemide (LASIX) 20 MG tablet, Take 1 tablet (20 mg total) by mouth 2 (two) times daily., Disp: 180 tablet, Rfl: 3 .  levothyroxine (SYNTHROID, LEVOTHROID) 150 MCG tablet, TAKE 1 TABLET BY MOUTH  DAILY BEFORE BREAKFAST, Disp: 90 tablet, Rfl: 3 .  losartan (COZAAR) 50 MG  tablet, TAKE 1 TABLET BY MOUTH  DAILY, Disp: 90 tablet, Rfl: 3 .  montelukast (SINGULAIR) 10 MG tablet, Take 1 tablet (10 mg total) by mouth at bedtime., Disp: 90 tablet, Rfl: 3 .  NON FORMULARY, 2lpm with sleep and with exertion if needed, Disp: , Rfl:  .  pantoprazole (PROTONIX) 40 MG tablet, TAKE 1 TABLET BY MOUTH  DAILY 30 TO 60 MINUTES  BEFORE FIRST MEAL OF THE  DAY, Disp: 90 tablet, Rfl: 2 .  traMADol (ULTRAM) 50 MG tablet, TAKE 1-2 TABLETS EVERY 6 HOURS AS NEEDED FOR PAIN, Disp: 90 tablet, Rfl: 0 .  triamcinolone cream (KENALOG) 0.1 %, Apply 1 application topically 2 (two) times daily as needed (for psorisis)., Disp: , Rfl:   Past Medical History: Past Medical History:  Diagnosis Date  . Arthritis    OA RIGHT KNEE WITH PAIN  . Barrett esophagus   . Chronic respiratory failure ([place])   . COPD (chronic obstructive pulmonary disease) ([place])   . GERD (gastroesophageal reflux disease)   . History of ARDS 2006  . History of home oxygen therapy    AT NIGHT WHEN SLEEPING 2 L / MIN NASAL CANNULA  . Hypertension   . Hypothyroidism   . NSTEMI (non-ST elevated myocardial infarction) ([place]) 05/31/2015  . Pneumococcal pneumonia ([place]) 2006   HOSPITALIZED AND DEVELOPED ARDS  . Psoriatic arthritis ([place])   . Pulmonary fibrosis (  [place])   . Rheumatic disease   . SOB (shortness of breath) on exertion     Tobacco Use: History  Smoking Status  . Former Smoker  . Packs/day: 1.50  . Years: 58.50  . Types: Cigarettes  . Quit date: 03/03/2005  Smokeless Tobacco  . Never Used    Labs: Recent Review Flowsheet Data    Labs for ITP Cardiac and Pulmonary Rehab Latest Ref Rng & Units 06/05/2015 06/05/2015 06/05/2015 07/12/2015 09/29/2016   Cholestrol <200 mg/dL - - - 117 157   LDLCALC <100 mg/dL - - - 51 61   LDLDIRECT mg/dL - - - - -   HDL >50 mg/dL - - - 51 76   Trlycerides <150 mg/dL - - - 75 101   Hemoglobin A1c 4.8 - 5.6 % - - - - -   PHART 7.350 - 7.450 7.268(L) - - - -   PCO2ART 35.0 - 45.0 mmHg  55.6(H) - - - -   HCO3 20.0 - 24.0 mEq/L 25.5(H) - - - -   TCO2 0 - 100 mmol/L 27 14 25  - -   ACIDBASEDEF 0.0 - 2.0 mmol/L 2.0 - - - -   O2SAT % 98.0 - - - -      Capillary Blood Glucose: Lab Results  Component Value Date   GLUCAP 113 (H) 06/06/2015   GLUCAP 107 (H) 06/06/2015   GLUCAP 128 (H) 06/05/2015   GLUCAP 114 (H) 06/05/2015   GLUCAP 109 (H) 06/05/2015     Pulmonary Assessment Scores:     Pulmonary Assessment Scores    Row Name 05/22/17 1008         ADL UCSD   ADL Phase Entry       mMRC Score   mMRC Score 1        Pulmonary Function Assessment:     Pulmonary Function Assessment - 05/11/17 1103      Breath   Bilateral Breath Sounds Clear;Decreased   Shortness of Breath Yes;Limiting activity      Exercise Target Goals:    Exercise Program Goal: Individual exercise prescription set with THRR, safety & activity barriers. Participant demonstrates ability to understand and report RPE using BORG scale, to self-measure pulse accurately, and to acknowledge the importance of the exercise prescription.  Exercise Prescription Goal: Starting with aerobic activity 30 plus minutes a day, 3 days per week for initial exercise prescription. Provide home exercise prescription and guidelines that participant acknowledges understanding prior to discharge.  Activity Barriers & Risk Stratification:   6 Minute Walk:     6 Minute Walk    Row Name 05/22/17 1001 05/22/17 1007       6 Minute Walk   Phase Initial  -    Distance 1010 feet  -    Walk Time 6 minutes  -    # of Rest Breaks 0  -    MPH 1.91  -    METS 2.45  -    RPE 17  -    Perceived Dyspnea  3  -    Symptoms Yes (comment)  -    Comments used wheelchair  -    Resting HR 88 bpm  -    Resting BP 139/76  -    Max Ex. HR 140 bpm  -    Max Ex. BP 197/100  RECHECK: 209/75, 188/94, after seated for 20 minutes 154/84  -      Interval HR   Baseline HR (retired) 88  -  1 Minute HR 116  -    2 Minute  HR 118  -    3 Minute HR 126  -    4 Minute HR 136  -    5 Minute HR 140  -    6 Minute HR 136  -    2 Minute Post HR 121  -    Interval Heart Rate? Yes  -      Interval Oxygen   Interval Oxygen? Yes  -    Baseline Oxygen Saturation % 92 %  -    Resting Liters of Oxygen 2 L  -    1 Minute Oxygen Saturation % 87 %  -    1 Minute Liters of Oxygen 2 L  -    2 Minute Oxygen Saturation % 86 %  -    2 Minute Liters of Oxygen 2 L  -    3 Minute Oxygen Saturation % 86 %  -    3 Minute Liters of Oxygen 2 L  -    4 Minute Oxygen Saturation % 91 %  -    4 Minute Liters of Oxygen 2 L 3 L    5 Minute Oxygen Saturation % 93 %  -    5 Minute Liters of Oxygen 2 L 3 L    6 Minute Oxygen Saturation % 90 %  -    6 Minute Liters of Oxygen 2 L 3 L    2 Minute Post Oxygen Saturation % 94 %  -    2 Minute Post Liters of Oxygen 2 L 3 L       Oxygen Initial Assessment:     Oxygen Initial Assessment - 05/22/17 1007      Initial 6 min Walk   Oxygen Used Continuous;E-Tanks   Liters per minute 2   Resting Oxygen Saturation  92 %   Exercise Oxygen Saturation  during 6 min walk 86 %  INCREASED TO 3 LITERS     Program Oxygen Prescription   Program Oxygen Prescription Continuous;E-Tanks   Liters per minute 3      Oxygen Re-Evaluation:     Oxygen Re-Evaluation    Row Name 06/02/17 1717 07/01/17 2113 07/24/17 1345 08/24/17 0742       Program Oxygen Prescription   Program Oxygen Prescription Continuous;E-Tanks Continuous;E-Tanks Continuous;E-Tanks Continuous;E-Tanks    Liters per minute 3 3 3 3       Home Oxygen   Home Oxygen Device Home Concentrator;Portable Concentrator;E-Tanks Home Concentrator;Portable Concentrator;E-Tanks Home Concentrator;Portable Concentrator;E-Tanks Home Concentrator;Portable Concentrator;E-Tanks    Sleep Oxygen Prescription Continuous Continuous Continuous Continuous    Liters per minute 2 2 2   -    Home Exercise Oxygen Prescription Continuous Continuous Continuous  Continuous    Liters per minute 2 3 4 4     Home at Rest Exercise Oxygen Prescription None None None None    Compliance with Home Oxygen Use Yes Yes Yes Yes      Goals/Expected Outcomes   Short Term Goals To learn and exhibit compliance with exercise, home and travel O2 prescription;To learn and understand importance of monitoring SPO2 with pulse oximeter and demonstrate accurate use of the pulse oximeter.;To Learn and understand importance of maintaining oxygen saturations>88%;To learn and demonstrate proper purse lipped breathing techniques or other breathing techniques.;To learn and demonstrate proper use of respiratory medications To learn and exhibit compliance with exercise, home and travel O2 prescription;To learn and understand importance of monitoring SPO2 with pulse oximeter and demonstrate  accurate use of the pulse oximeter.;To learn and understand importance of maintaining oxygen saturations>88%;To learn and demonstrate proper pursed lip breathing techniques or other breathing techniques.;To learn and demonstrate proper use of respiratory medications To learn and exhibit compliance with exercise, home and travel O2 prescription;To learn and understand importance of monitoring SPO2 with pulse oximeter and demonstrate accurate use of the pulse oximeter.;To learn and understand importance of maintaining oxygen saturations>88%;To learn and demonstrate proper pursed lip breathing techniques or other breathing techniques.;To learn and demonstrate proper use of respiratory medications To learn and exhibit compliance with exercise, home and travel O2 prescription;To learn and understand importance of monitoring SPO2 with pulse oximeter and demonstrate accurate use of the pulse oximeter.;To learn and understand importance of maintaining oxygen saturations>88%;To learn and demonstrate proper pursed lip breathing techniques or other breathing techniques.;To learn and demonstrate proper use of respiratory  medications    Long  Term Goals Exhibits compliance with exercise, home and travel O2 prescription;Verbalizes importance of monitoring SPO2 with pulse oximeter and return demonstration;Maintenance of O2 saturations>88%;Exhibits proper breathing techniques, such as purse lipped breathing or other method taught during program session;Compliance with respiratory medication Exhibits compliance with exercise, home and travel O2 prescription;Verbalizes importance of monitoring SPO2 with pulse oximeter and return demonstration;Maintenance of O2 saturations>88%;Exhibits proper breathing techniques, such as pursed lip breathing or other method taught during program session;Compliance with respiratory medication Exhibits compliance with exercise, home and travel O2 prescription;Verbalizes importance of monitoring SPO2 with pulse oximeter and return demonstration;Maintenance of O2 saturations>88%;Exhibits proper breathing techniques, such as pursed lip breathing or other method taught during program session;Compliance with respiratory medication Exhibits compliance with exercise, home and travel O2 prescription;Verbalizes importance of monitoring SPO2 with pulse oximeter and return demonstration;Maintenance of O2 saturations>88%;Exhibits proper breathing techniques, such as pursed lip breathing or other method taught during program session;Compliance with respiratory medication    Comments  - goals met patient does not have adequate oxygen for home exercise. will work with MD and oxygen supply company to obtain adequate oxygen for home exercise. patient now has adequate home oxygen for exercise       Oxygen Discharge (Final Oxygen Re-Evaluation):     Oxygen Re-Evaluation - 08/24/17 0742      Program Oxygen Prescription   Program Oxygen Prescription Continuous;E-Tanks   Liters per minute 3     Home Oxygen   Home Oxygen Device Home Concentrator;Portable Concentrator;E-Tanks   Sleep Oxygen Prescription  Continuous   Home Exercise Oxygen Prescription Continuous   Liters per minute 4   Home at Rest Exercise Oxygen Prescription None   Compliance with Home Oxygen Use Yes     Goals/Expected Outcomes   Short Term Goals To learn and exhibit compliance with exercise, home and travel O2 prescription;To learn and understand importance of monitoring SPO2 with pulse oximeter and demonstrate accurate use of the pulse oximeter.;To learn and understand importance of maintaining oxygen saturations>88%;To learn and demonstrate proper pursed lip breathing techniques or other breathing techniques.;To learn and demonstrate proper use of respiratory medications   Long  Term Goals Exhibits compliance with exercise, home and travel O2 prescription;Verbalizes importance of monitoring SPO2 with pulse oximeter and return demonstration;Maintenance of O2 saturations>88%;Exhibits proper breathing techniques, such as pursed lip breathing or other method taught during program session;Compliance with respiratory medication   Comments patient now has adequate home oxygen for exercise      Initial Exercise Prescription:     Initial Exercise Prescription - 05/22/17 1000      Date of  Initial Exercise RX and Referring Provider   Date 05/22/17   Referring Provider Dr. Melvyn Novas     Oxygen   Oxygen Continuous   Liters 3     NuStep   Level 2   Minutes 17   METs 1.5     Arm Ergometer   Level 2   Minutes 17     Track   Laps 10   Minutes 17     Prescription Details   Frequency (times per week) 2   Duration Progress to 45 minutes of aerobic exercise without signs/symptoms of physical distress     Intensity   THRR 40-80% of Max Heartrate 60-120   Ratings of Perceived Exertion 11-13   Perceived Dyspnea 0-4     Progression   Progression Continue progressive overload as per policy without signs/symptoms or physical distress.     Resistance Training   Training Prescription Yes   Weight blue bands   Reps 10-15       Perform Capillary Blood Glucose checks as needed.  Exercise Prescription Changes:     Exercise Prescription Changes    Row Name 05/28/17 1500 06/02/17 1500 06/04/17 1500 06/09/17 1500 06/16/17 1500     Response to Exercise   Blood Pressure (Admit) 144/64 138/70 164/70 138/60 138/70   Blood Pressure (Exercise) 160/76 168/86 150/78 140/64 168/74   Blood Pressure (Exit) 140/72 140/70 136/64 124/62 138/76   Heart Rate (Admit) 96 bpm 91 bpm 88 bpm 90 bpm 93 bpm   Heart Rate (Exercise) 116 bpm 116 bpm 121 bpm 127 bpm 127 bpm   Heart Rate (Exit) 104 bpm 98 bpm 97 bpm 99 bpm 108 bpm   Oxygen Saturation (Admit) 90 % 91 % 87 % 98 % 93 %   Oxygen Saturation (Exercise) 90 % 93 % 89 % 92 % 88 %   Oxygen Saturation (Exit) 92 % 96 % 96 % 99 % 97 %   Rating of Perceived Exertion (Exercise) 13 13 17 13 12    Perceived Dyspnea (Exercise) 2 2 3 1 2    Duration Continue with 45 min of aerobic exercise without signs/symptoms of physical distress. Continue with 45 min of aerobic exercise without signs/symptoms of physical distress. Continue with 45 min of aerobic exercise without signs/symptoms of physical distress. Continue with 45 min of aerobic exercise without signs/symptoms of physical distress. Continue with 45 min of aerobic exercise without signs/symptoms of physical distress.   Intensity THRR unchanged THRR unchanged THRR unchanged THRR unchanged THRR unchanged     Progression   Progression Continue to progress workloads to maintain intensity without signs/symptoms of physical distress. Continue to progress workloads to maintain intensity without signs/symptoms of physical distress. Continue to progress workloads to maintain intensity without signs/symptoms of physical distress. Continue to progress workloads to maintain intensity without signs/symptoms of physical distress. Continue to progress workloads to maintain intensity without signs/symptoms of physical distress.     Resistance Training    Training Prescription Yes Yes Yes Yes Yes   Weight blue bands blue bands blue bands blue bands blue bands   Reps 10-15 10-15 10-15 10-15 10-15   Time  -  -  - 10 Minutes 10 Minutes     Oxygen   Oxygen Continuous Continuous Continuous Continuous Continuous   Liters 3 3 3 3 3      NuStep   Level 2 3  - 4 4   Minutes 17 17  - 17 17   METs 1.7 1.9  - 2 1.6  Arm Ergometer   Level  - 2 2 3 2    Minutes  - 17 17 17 17      Track   Laps 9 7 9 9 9    Minutes 17 17 17 17 17    Row Name 06/18/17 1500 06/23/17 1500 06/25/17 1555 07/02/17 1600 07/21/17 1550     Response to Exercise   Blood Pressure (Admit) 134/54 170/74 144/64 118/70 126/62   Blood Pressure (Exercise) 146/60 148/76 160/60 180/70 160/60   Blood Pressure (Exit) 118/60 124/72 144/62 120/62 124/60   Heart Rate (Admit) 89 bpm 101 bpm 80 bpm 85 bpm 92 bpm   Heart Rate (Exercise) 128 bpm 120 bpm 109 bpm 129 bpm 124 bpm   Heart Rate (Exit) 108 bpm 99 bpm 90 bpm 95 bpm 102 bpm   Oxygen Saturation (Admit) 89 % 94 % 95 % 97 % 95 %   Oxygen Saturation (Exercise) 88 % 91 % 93 % 95 % 85 %   Oxygen Saturation (Exit) 94 % 98 % 97 % 99 % 95 %   Rating of Perceived Exertion (Exercise) 12 13 13 11 17    Perceived Dyspnea (Exercise) 1 1 2 1 4    Duration Continue with 45 min of aerobic exercise without signs/symptoms of physical distress. Continue with 45 min of aerobic exercise without signs/symptoms of physical distress. Continue with 45 min of aerobic exercise without signs/symptoms of physical distress. Continue with 45 min of aerobic exercise without signs/symptoms of physical distress. Continue with 45 min of aerobic exercise without signs/symptoms of physical distress.   Intensity THRR unchanged THRR unchanged THRR unchanged THRR unchanged THRR unchanged     Progression   Progression Continue to progress workloads to maintain intensity without signs/symptoms of physical distress. Continue to progress workloads to maintain intensity without  signs/symptoms of physical distress. Continue to progress workloads to maintain intensity without signs/symptoms of physical distress. Continue to progress workloads to maintain intensity without signs/symptoms of physical distress. Continue to progress workloads to maintain intensity without signs/symptoms of physical distress.     Resistance Training   Training Prescription Yes Yes Yes Yes Yes   Weight blue bands blue bands blue bands blue bands blue bands   Reps 10-15 10-15 10-15 10-15 10-15   Time 10 Minutes 10 Minutes 10 Minutes 10 Minutes 10 Minutes     Oxygen   Oxygen Continuous Continuous Continuous Continuous Continuous   Liters 3 3 3 3 3      NuStep   Level 4  - 4 4 4    Minutes 17  - 17 17 17    METs 2.1  - 2.4 1.9 2.4     Arm Ergometer   Level 3 3 4   - 4   Minutes 17 17 17   - 17     Track   Laps 11 11  - 11 6   Minutes 17 17  - 17 17   Row Name 07/23/17 1600 07/28/17 1500 07/30/17 1500 08/04/17 1600 08/06/17 1600     Response to Exercise   Blood Pressure (Admit) 124/68 140/64 150/72 134/62 124/60   Blood Pressure (Exercise) 162/60 134/70 166/62 140/64 144/70   Blood Pressure (Exit) 118/68 120/66 131/79 124/56 104/60   Heart Rate (Admit) 89 bpm 91 bpm 87 bpm 93 bpm 90 bpm   Heart Rate (Exercise) 122 bpm 125 bpm 111 bpm 130 bpm 135 bpm   Heart Rate (Exit) 96 bpm 98 bpm 90 bpm 107 bpm 107 bpm   Oxygen Saturation (Admit) 96 %  93 % 96 % 96 % 96 %   Oxygen Saturation (Exercise) 92 % 93 % 92 % 90 % 92 %   Oxygen Saturation (Exit) 97 % 97 % 97 % 98 % 95 %   Rating of Perceived Exertion (Exercise) 13 13 13 14 15    Perceived Dyspnea (Exercise) 2 3 2 1 3    Duration Continue with 45 min of aerobic exercise without signs/symptoms of physical distress. Continue with 45 min of aerobic exercise without signs/symptoms of physical distress. Continue with 45 min of aerobic exercise without signs/symptoms of physical distress. Continue with 45 min of aerobic exercise without signs/symptoms  of physical distress. Continue with 45 min of aerobic exercise without signs/symptoms of physical distress.   Intensity THRR unchanged THRR unchanged THRR unchanged THRR unchanged THRR unchanged     Progression   Progression Continue to progress workloads to maintain intensity without signs/symptoms of physical distress. Continue to progress workloads to maintain intensity without signs/symptoms of physical distress. Continue to progress workloads to maintain intensity without signs/symptoms of physical distress. Continue to progress workloads to maintain intensity without signs/symptoms of physical distress. Continue to progress workloads to maintain intensity without signs/symptoms of physical distress.     Resistance Training   Training Prescription Yes Yes Yes Yes Yes   Weight blue bands blue bands blue bands blue bands blue bands   Reps 10-15 10-15 10-15 10-15 10-15   Time 10 Minutes 10 Minutes 10 Minutes 10 Minutes 10 Minutes     Oxygen   Oxygen Continuous Continuous Continuous Continuous Continuous   Liters 3 3-4 3-4 3-4 3-4     NuStep   Level 4 4 4 5 6    Minutes 17 17 17 17 17    METs 1.2 2.2 2.2 2.4 2.7     Arm Ergometer   Level  - 4 5 5   -   Minutes  - 17 17 17   -     Track   Laps 8 8  - 8 10   Minutes 17 17  - 17 17     Home Exercise Plan   Plans to continue exercise at Home (comment)  -  -  -  -   Frequency Add 3 additional days to program exercise sessions.  -  -  -  -   Row Name 08/18/17 1500 08/20/17 1554           Response to Exercise   Blood Pressure (Admit) 168/70 150/64      Blood Pressure (Exercise) 140/60 170/62      Blood Pressure (Exit) 134/62 138/80      Heart Rate (Admit) 106 bpm 76 bpm      Heart Rate (Exercise) 115 bpm 124 bpm      Heart Rate (Exit) 97 bpm 97 bpm      Oxygen Saturation (Admit) 91 % 93 %      Oxygen Saturation (Exercise) 92 % 87 %      Oxygen Saturation (Exit) 96 % 96 %      Rating of Perceived Exertion (Exercise) 13 13       Perceived Dyspnea (Exercise) 2 3      Duration Continue with 45 min of aerobic exercise without signs/symptoms of physical distress. Continue with 45 min of aerobic exercise without signs/symptoms of physical distress.      Intensity THRR unchanged THRR unchanged        Progression   Progression Continue to progress workloads to maintain intensity without signs/symptoms of  physical distress. Continue to progress workloads to maintain intensity without signs/symptoms of physical distress.        Resistance Training   Training Prescription Yes Yes      Weight blue bands blue bands      Reps 10-15 10-15      Time 10 Minutes 10 Minutes        Oxygen   Oxygen Continuous Continuous      Liters 3.-4 3.-4        NuStep   Level 6  -      Minutes 17  -      METs 2.3  -        Arm Ergometer   Level 6 6      Minutes 34 17        Track   Laps  - 9      Minutes  - 17         Exercise Comments:     Exercise Comments    Row Name 07/28/17 0714           Exercise Comments Home exercise completed          Exercise Goals and Review:   Exercise Goals Re-Evaluation :     Exercise Goals Re-Evaluation    Row Name 05/29/17 1119 06/30/17 0956 07/20/17 1419 08/22/17 1347       Exercise Goal Re-Evaluation   Exercise Goals Review Increase Strenth and Stamina;Increase Physical Activity Increase Strength and Stamina;Increase Physical Activity;Able to understand and use Dyspnea scale;Able to understand and use rate of perceived exertion (RPE) scale;Knowledge and understanding of Target Heart Rate Range (THRR);Understanding of Exercise Prescription Increase Strength and Stamina;Increase Physical Activity;Able to understand and use rate of perceived exertion (RPE) scale;Able to understand and use Dyspnea scale;Knowledge and understanding of Target Heart Rate Range (THRR);Understanding of Exercise Prescription Increase Strength and Stamina;Increase Physical Activity;Able to understand and use  Dyspnea scale;Able to understand and use rate of perceived exertion (RPE) scale;Knowledge and understanding of Target Heart Rate Range (THRR);Understanding of Exercise Prescription    Comments Patient has only attended one exercise session. Will cont. to monitor and progress as able.  Patient is progressing well in program. Averages 2.0-2.4 METs. She is open to workload changes. Will cont to progress as able.  Patient has had extended absence due to family issues. Will cont. to monitor and progress as able when patient returns.  Patient is progressing well in the program. Works hard and shows motivation. 8-10 laps (200 ft each) in 15 minutes. MET levels fall in "low"category but progression is being shown. Open to workload changes. Will cont. to progress and monitor.    Expected Outcomes Through exercising at rehab and at home, patient will increase physicial capacity, strength, and stamina.  Through exercising at rehab and at home, patient will increase physicial capacity, strength, and stamina.  Through exercising at rehab and at home, patient will increase physicial capacity, strength, and stamina.  Through exercising at rehab and at home, patient will increase physicial capacity, strength, and stamina.        Discharge Exercise Prescription (Final Exercise Prescription Changes):     Exercise Prescription Changes - 08/20/17 1554      Response to Exercise   Blood Pressure (Admit) 150/64   Blood Pressure (Exercise) 170/62   Blood Pressure (Exit) 138/80   Heart Rate (Admit) 76 bpm   Heart Rate (Exercise) 124 bpm   Heart Rate (Exit) 97 bpm   Oxygen Saturation (Admit) 93 %  Oxygen Saturation (Exercise) 87 %   Oxygen Saturation (Exit) 96 %   Rating of Perceived Exertion (Exercise) 13   Perceived Dyspnea (Exercise) 3   Duration Continue with 45 min of aerobic exercise without signs/symptoms of physical distress.   Intensity THRR unchanged     Progression   Progression Continue to progress  workloads to maintain intensity without signs/symptoms of physical distress.     Resistance Training   Training Prescription Yes   Weight blue bands   Reps 10-15   Time 10 Minutes     Oxygen   Oxygen Continuous   Liters 3.-4     Arm Ergometer   Level 6   Minutes 17     Track   Laps 9   Minutes 17      Nutrition:  Target Goals: Understanding of nutrition guidelines, daily intake of sodium <158m, cholesterol <2050m calories 30% from fat and 7% or less from saturated fats, daily to have 5 or more servings of fruits and vegetables.  Biometrics:     Pre Biometrics - 05/11/17 1107      Pre Biometrics   Grip Strength 33 kg       Nutrition Therapy Plan and Nutrition Goals:     Nutrition Therapy & Goals - 05/21/17 0829      Nutrition Therapy   Diet General, Healthful     Personal Nutrition Goals   Nutrition Goal Identify food quantities necessary to achieve wt loss of  -2# per week to a goal wt loss of 2.7-10.9 kg (6-24 lb) at graduation from pulmonary rehab.     Intervention Plan   Intervention Prescribe, educate and counsel regarding individualized specific dietary modifications aiming towards targeted core components such as weight, hypertension, lipid management, diabetes, heart failure and other comorbidities.   Expected Outcomes Short Term Goal: Understand basic principles of dietary content, such as calories, fat, sodium, cholesterol and nutrients.;Long Term Goal: Adherence to prescribed nutrition plan.      Nutrition Discharge: Rate Your Plate Scores:     Nutrition Assessments - 05/21/17 0826      Rate Your Plate Scores   Pre Score 54      Nutrition Goals Re-Evaluation:   Nutrition Goals Discharge (Final Nutrition Goals Re-Evaluation):   Psychosocial: Target Goals: Acknowledge presence or absence of significant depression and/or stress, maximize coping skills, provide positive support system. Participant is able to verbalize types and ability  to use techniques and skills needed for reducing stress and depression.  Initial Review & Psychosocial Screening:     Initial Psych Review & Screening - 05/11/17 1113      Initial Review   Current issues with History of Depression     Family Dynamics   Good Support System? Yes     Barriers   Psychosocial barriers to participate in program The patient should benefit from training in stress management and relaxation.     Screening Interventions   Interventions Encouraged to exercise      Quality of Life Scores:   PHQ-9: Recent Review Flowsheet Data    [facility]/11[facility]2017 06/20/2016 04/12/2014   Decreased Interest 3 0 0   Down, Depressed, Hopeless 2 0 0   PHQ - 2 Score 5 0 0   Altered sleeping 3 - -   Tired, decreased energy 3 - -   Change in appetite 3 - -   Feeling bad or failure about yourself  0 - -   Trouble concentrating 0 - -  Moving slowly or fidgety/restless 0 - -   Suicidal thoughts 0 - -   PHQ-9 Score 14 - -   Difficult doing work/chores Somewhat difficult - -     Interpretation of Total Score  Total Score Depression Severity:  1-4 = Minimal depression, 5-9 = Mild depression, 10-14 = Moderate depression, 15-19 = Moderately severe depression, 20-27 = Severe depression   Psychosocial Evaluation and Intervention:     Psychosocial Evaluation - 05/11/17 1116      Psychosocial Evaluation & Interventions   Interventions Physician referral;Therapist referral;Encouraged to exercise with the program and follow exercise prescription   Continue Psychosocial Services  Follow up required by counselor      Psychosocial Re-Evaluation:     Psychosocial Re-Evaluation    [place] Name 07/01/17 2116 07/24/17 1350 08/24/17 0753         Psychosocial Re-Evaluation   Current issues with Current Depression;History of Depression Current Depression;History of Depression Current Depression;History of Depression     Comments patient follows up with therapist  patient follows up with therapist patient follows up with therapist. no psychosocial barriers to participation identified over the past 30 days     Expected Outcomes patient will remain free from psychosocial barriers to participation in pulmonary rehab patient will remain free from psychosocial barriers to participation in pulmonary rehab patient will remain free from psychosocial barriers to participation in pulmonary rehab     Interventions Encouraged to attend Pulmonary Rehabilitation for the exercise Encouraged to attend Pulmonary Rehabilitation for the exercise Encouraged to attend Pulmonary Rehabilitation for the exercise     Continue Psychosocial Services  No Follow up required No Follow up required No Follow up required        Psychosocial Discharge (Final Psychosocial Re-Evaluation):     Psychosocial Re-Evaluation - 08/24/17 0753      Psychosocial Re-Evaluation   Current issues with Current Depression;History of Depression   Comments patient follows up with therapist. no psychosocial barriers to participation identified over the past 30 days   Expected Outcomes patient will remain free from psychosocial barriers to participation in pulmonary rehab   Interventions Encouraged to attend Pulmonary Rehabilitation for the exercise   Continue Psychosocial Services  No Follow up required      Education: Education Goals: Education classes will be provided on a weekly basis, covering required topics. Participant will state understanding/return demonstration of topics presented.  Learning Barriers/Preferences:     Learning Barriers/Preferences - 05/11/17 1102      Learning Barriers/Preferences   Learning Barriers None   Learning Preferences Video;Pictoral;Audio;Computer/Internet      Education Topics: Risk Factor Reduction:  -Group instruction that is supported by a PowerPoint presentation. Instructor discusses the definition of a risk factor, different risk factors for pulmonary  disease, and how the heart and lungs work together.     PULMONARY REHAB OTHER RESPIRATORY from 08/20/2017 in [place]  Date  05/28/17  Educator  ep  Instruction Review Code  2- meets goals/outcomes      Nutrition for Pulmonary Patient:  -Group instruction provided by PowerPoint slides, verbal discussion, and written materials to support subject matter. The instructor gives an explanation and review of healthy diet recommendations, which includes a discussion on weight management, recommendations for fruit and vegetable consumption, as well as protein, fluid, caffeine, fiber, sodium, sugar, and alcohol. Tips for eating when patients are short of breath are discussed.   PULMONARY REHAB OTHER RESPIRATORY from 08/20/2017 in [place]  REHAB  Date  08/20/17  Educator  edna  Instruction Review Code  2- meets goals/outcomes      Pursed Lip Breathing:  -Group instruction that is supported by demonstration and informational handouts. Instructor discusses the benefits of pursed lip and diaphragmatic breathing and detailed demonstration on how to preform both.     Oxygen Safety:  -Group instruction provided by PowerPoint, verbal discussion, and written material to support subject matter. There is an overview of "What is Oxygen" and "Why do we need it".  Instructor also reviews how to create a safe environment for oxygen use, the importance of using oxygen as prescribed, and the risks of noncompliance. There is a brief discussion on traveling with oxygen and resources the patient may utilize.   Oxygen Equipment:  -Group instruction provided by [facility] Staff utilizing handouts, written materials, and equipment demonstrations.   Signs and Symptoms:  -Group instruction provided by written material and verbal discussion to support subject matter. Warning signs and symptoms of infection, stroke, and heart attack are reviewed and when to  call the physician/911 reinforced. Tips for preventing the spread of infection discussed.   PULMONARY REHAB OTHER RESPIRATORY from 08/20/2017 in [place]  Date  07/23/17  Educator  rn  Instruction Review Code  2- meets goals/outcomes      Advanced Directives:  -Group instruction provided by verbal instruction and written material to support subject matter. Instructor reviews Advanced Directive laws and proper instruction for filling out document.   Pulmonary Video:  -Group video education that reviews the importance of medication and oxygen compliance, exercise, good nutrition, pulmonary hygiene, and pursed lip and diaphragmatic breathing for the pulmonary patient.   PULMONARY REHAB OTHER RESPIRATORY from 08/20/2017 in [place]  Date  07/30/17  Instruction Review Code  2- meets goals/outcomes      Exercise for the Pulmonary Patient:  -Group instruction that is supported by a PowerPoint presentation. Instructor discusses benefits of exercise, core components of exercise, frequency, duration, and intensity of an exercise routine, importance of utilizing pulse oximetry during exercise, safety while exercising, and options of places to exercise outside of rehab.     Pulmonary Medications:  -Verbally interactive group education provided by instructor with focus on inhaled medications and proper administration.   PULMONARY REHAB OTHER RESPIRATORY from 08/20/2017 in [place]  Date  07/02/17  Educator  pharmacist  Instruction Review Code  2- meets goals/outcomes      Anatomy and Physiology of the Respiratory System and Intimacy:  -Group instruction provided by PowerPoint, verbal discussion, and written material to support subject matter. Instructor reviews respiratory cycle and anatomical components of the respiratory system and their functions. Instructor also reviews differences in  obstructive and restrictive respiratory diseases with examples of each. Intimacy, Sex, and Sexuality differences are reviewed with a discussion on how relationships can change when diagnosed with pulmonary disease. Common sexual concerns are reviewed.   MD DAY -A group question and answer session with a medical doctor that allows participants to ask questions that relate to their pulmonary disease state.   OTHER EDUCATION -Group or individual verbal, written, or video instructions that support the educational goals of the pulmonary rehab program.   Knowledge Questionnaire Score:   Core Components/Risk Factors/Patient Goals at Admission:     Personal Goals and Risk Factors at Admission - 05/11/17 1108      Core Components/Risk Factors/Patient Goals on Admission  Weight Management Yes   Intervention Weight Management: Develop a combined nutrition and exercise program designed to reach desired caloric intake, while maintaining appropriate intake of nutrient and fiber, sodium and fats, and appropriate energy expenditure required for the weight goal.   Admit Weight 213 lb 3 oz (96.7 kg)   Goal Weight: Short Term 202 lb 13.2 oz (92 kg)   Goal Weight: Long Term 203 lb (92.1 kg)   Expected Outcomes Short Term: Continue to assess and modify interventions until short term weight is achieved   Improve shortness of breath with ADL's Yes   Intervention Provide education, individualized exercise plan and daily activity instruction to help decrease symptoms of SOB with activities of daily living.   Expected Outcomes Short Term: Achieves a reduction of symptoms when performing activities of daily living.   Develop more efficient breathing techniques such as purse lipped breathing and diaphragmatic breathing; and practicing self-pacing with activity Yes   Intervention Provide education, demonstration and support about specific breathing techniuqes utilized for more efficient breathing. Include  techniques such as pursed lipped breathing, diaphragmatic breathing and self-pacing activity.   Expected Outcomes Short Term: Participant will be able to demonstrate and use breathing techniques as needed throughout daily activities.   Stress Yes   Intervention Offer individual and/or small group education and counseling on adjustment to heart disease, stress management and health-related lifestyle change. Teach and support self-help strategies.   Expected Outcomes Short Term: Participant demonstrates changes in health-related behavior, relaxation and other stress management skills, ability to obtain effective social support, and compliance with psychotropic medications if prescribed.      Core Components/Risk Factors/Patient Goals Review:      Goals and Risk Factor Review    Row Name 06/02/17 1718 07/01/17 2115 07/24/17 1349 08/24/17 0751       Core Components/Risk Factors/Patient Goals Review   Personal Goals Review  - Weight Management/Obesity;Improve shortness of breath with ADL's;Develop more efficient breathing techniques such as purse lipped breathing and diaphragmatic breathing and practicing self-pacing with activity. Weight Management/Obesity;Improve shortness of breath with ADL's;Develop more efficient breathing techniques such as purse lipped breathing and diaphragmatic breathing and practicing self-pacing with activity. Weight Management/Obesity;Improve shortness of breath with ADL's;Develop more efficient breathing techniques such as purse lipped breathing and diaphragmatic breathing and practicing self-pacing with activity.    Review patient has only attended 2 sessions since admission and too soon to evaluate progress towards goals patient has mastered the purse lip breathing technique and is often observed using the technique to reduce her shortness of breath. she is not loosing weight but admits to not changing her caloric intake. patient has mastered the purse lip breathing  technique and is often observed using the technique to reduce her shortness of breath. she is not loosing weight but admits to not changing her caloric intake. patient continues to work hard in pulmonary rehab. she uses PLB without cueing. He weight remains relatively stable however we celebrate any success even if it is only weight loss by a tenth.    Expected Outcomes see admission expected outcomes see admission expected outcomes see admission expected outcomes see admission expected outcomes       Core Components/Risk Factors/Patient Goals at Discharge (Final Review):      Goals and Risk Factor Review - 08/24/17 0751      Core Components/Risk Factors/Patient Goals Review   Personal Goals Review Weight Management/Obesity;Improve shortness of breath with ADL's;Develop more efficient breathing techniques such as purse lipped breathing and diaphragmatic  breathing and practicing self-pacing with activity.   Review patient continues to work hard in pulmonary rehab. she uses PLB without cueing. He weight remains relatively stable however we celebrate any success even if it is only weight loss by a tenth.   Expected Outcomes see admission expected outcomes      ITP Comments:   Comments: ITP REVIEW Pt is making expected progress toward pulmonary rehab goals after completing 17 sessions. Recommend continued exercise, life style modification, education, and utilization of breathing techniques to increase stamina and strength and decrease shortness of breath with exertion.

## 2017-08-25 ENCOUNTER — Encounter (HOSPITAL_COMMUNITY)
Admission: RE | Admit: 2017-08-25 | Discharge: 2017-08-25 | Disposition: A | Discharge status: Home / Self Care | Payer: Medicare Other | Source: Ambulatory Visit | Attending: Internal Medicine | Admitting: Internal Medicine

## 2017-08-25 VITALS — Wt 206.4 lb

## 2017-08-25 DIAGNOSIS — J8410 Pulmonary fibrosis, unspecified: Secondary | ICD-10-CM | POA: Diagnosis not present

## 2017-08-25 DIAGNOSIS — Z7951 Long term (current) use of inhaled steroids: Secondary | ICD-10-CM | POA: Diagnosis not present

## 2017-08-25 DIAGNOSIS — J9610 Chronic respiratory failure, unspecified whether with hypoxia or hypercapnia: Secondary | ICD-10-CM | POA: Diagnosis not present

## 2017-08-25 DIAGNOSIS — Z7982 Long term (current) use of aspirin: Secondary | ICD-10-CM | POA: Diagnosis not present

## 2017-08-25 DIAGNOSIS — J449 Chronic obstructive pulmonary disease, unspecified: Secondary | ICD-10-CM | POA: Diagnosis not present

## 2017-08-25 DIAGNOSIS — Z79899 Other long term (current) drug therapy: Secondary | ICD-10-CM | POA: Diagnosis not present

## 2017-08-25 NOTE — Progress Notes (Signed)
Daily Session Note  Patient Details  Name: [name] MRN: [id number] Date of Birth: [birth year] Referring Provider:     Pulmonary Rehab Walk Test from 05/21/2017 in [place]  Referring Provider  Dr. Melvyn Novas      Encounter Date: 08/25/2017  Check In:     Session Check In - 08/25/17 1330      Check-In   Location [facility]   Staff Present Rosebud Poles, RN, BSN;Molly diVincenzo, MS, ACSM RCEP, Exercise Physiologist   Supervising physician immediately available to respond to emergencies [facility] immediately available   Physician(s) Dr. Horris Latino   Medication changes reported     No   Fall or balance concerns reported    No   Tobacco Cessation No Change   Warm-up and Cool-down Performed as group-led instruction   Resistance Training Performed Yes   VAD Patient? No     Pain Assessment   Currently in Pain? No/denies   Multiple Pain Sites No      Capillary Blood Glucose: No results found for this or any previous visit (from the past 24 hour(s)).      Exercise Prescription Changes - 08/25/17 1538      Response to Exercise   Blood Pressure (Admit) 110/60   Blood Pressure (Exercise) 140/76   Blood Pressure (Exit) 120/70   Heart Rate (Admit) 87 bpm   Heart Rate (Exercise) 136 bpm   Heart Rate (Exit) 110 bpm   Oxygen Saturation (Admit) 96 %   Oxygen Saturation (Exercise) 90 %   Oxygen Saturation (Exit) 98 %   Rating of Perceived Exertion (Exercise) 13   Perceived Dyspnea (Exercise) 3   Duration Continue with 45 min of aerobic exercise without signs/symptoms of physical distress.   Intensity THRR unchanged     Progression   Progression Continue to progress workloads to maintain intensity without signs/symptoms of physical distress.     Resistance Training   Training Prescription Yes   Weight blue bands   Reps 10-15   Time 10 Minutes     Oxygen   Oxygen Continuous   Liters 4     NuStep   Level 6   Minutes 17   METs 2.6     Arm Ergometer   Level 6.5   Minutes 17     Track   Laps 11   Minutes 17      History  Smoking Status  . Former Smoker  . Packs/day: 1.50  . Years: 58.50  . Types: Cigarettes  . Quit date: 03/03/2005  Smokeless Tobacco  . Never Used    Goals Met:  Independence with exercise equipment Improved SOB with ADL's Using PLB without cueing & demonstrates good technique Exercise tolerated well No report of cardiac concerns or symptoms Strength training completed today  Goals Unmet:  Not Applicable  Comments: Service time is from 1330 to 1500   Dr. Rush Farmer is Medical Director for Pulmonary Rehab at [facility].

## 2017-08-27 ENCOUNTER — Encounter (HOSPITAL_COMMUNITY)
Admission: RE | Admit: 2017-08-27 | Discharge: 2017-08-27 | Disposition: A | Discharge status: Home / Self Care | Payer: Medicare Other | Source: Ambulatory Visit | Attending: Internal Medicine | Admitting: Internal Medicine

## 2017-08-27 VITALS — Wt 208.3 lb

## 2017-08-27 DIAGNOSIS — J8410 Pulmonary fibrosis, unspecified: Secondary | ICD-10-CM | POA: Diagnosis not present

## 2017-08-27 DIAGNOSIS — J9610 Chronic respiratory failure, unspecified whether with hypoxia or hypercapnia: Secondary | ICD-10-CM | POA: Diagnosis not present

## 2017-08-27 DIAGNOSIS — Z79899 Other long term (current) drug therapy: Secondary | ICD-10-CM | POA: Diagnosis not present

## 2017-08-27 DIAGNOSIS — Z7951 Long term (current) use of inhaled steroids: Secondary | ICD-10-CM | POA: Diagnosis not present

## 2017-08-27 DIAGNOSIS — Z7982 Long term (current) use of aspirin: Secondary | ICD-10-CM | POA: Diagnosis not present

## 2017-08-27 DIAGNOSIS — J449 Chronic obstructive pulmonary disease, unspecified: Secondary | ICD-10-CM | POA: Diagnosis not present

## 2017-08-27 NOTE — Progress Notes (Signed)
Daily Session Note  Patient Details  Name: [name] MRN: [id number] Date of Birth: 1945/11/12 Referring Provider:     Pulmonary Rehab Walk Test from 05/21/2017 in [place]  Referring Provider  Dr. Melvyn Novas      Encounter Date: 08/27/2017  Check In:     Session Check In - 08/27/17 1337      Check-In   Location [facility]   Staff Present Rodney Langton, RN;Trevionne Advani Rollene Rotunda, RN, BSN;Molly diVincenzo, MS, ACSM RCEP, Exercise Physiologist;Joan Leonia Reeves, RN, BSN   Supervising physician immediately available to respond to emergencies [facility] immediately available   Physician(s) Dr. Wendee Beavers   Medication changes reported     No   Fall or balance concerns reported    No   Tobacco Cessation No Change   Warm-up and Cool-down Performed as group-led instruction   Resistance Training Performed Yes   VAD Patient? No     Pain Assessment   Currently in Pain? No/denies   Multiple Pain Sites No      Capillary Blood Glucose: No results found for this or any previous visit (from the past 24 hour(s)).      Exercise Prescription Changes - 08/27/17 1546      Response to Exercise   Blood Pressure (Admit) 142/58   Blood Pressure (Exercise) 150/70   Blood Pressure (Exit) 140/80   Heart Rate (Admit) 98 bpm   Heart Rate (Exercise) 131 bpm   Heart Rate (Exit) 106 bpm   Oxygen Saturation (Admit) 94 %   Oxygen Saturation (Exercise) 86 %   Oxygen Saturation (Exit) 96 %   Rating of Perceived Exertion (Exercise) 12   Perceived Dyspnea (Exercise) 1   Duration Continue with 45 min of aerobic exercise without signs/symptoms of physical distress.   Intensity THRR unchanged     Progression   Progression Continue to progress workloads to maintain intensity without signs/symptoms of physical distress.     Resistance Training   Training Prescription Yes   Weight blue bands   Reps 10-15   Time 10 Minutes     Oxygen   Oxygen Continuous   Liters  4     Arm Ergometer   Level 7   Minutes 17     Track   Laps 13   Minutes 17      History  Smoking Status  . Former Smoker  . Packs/day: 1.50  . Years: 58.50  . Types: Cigarettes  . Quit date: 03/03/2005  Smokeless Tobacco  . Never Used    Goals Met:  Independence with exercise equipment Improved SOB with ADL's Using PLB without cueing & demonstrates good technique Exercise tolerated well No report of cardiac concerns or symptoms Strength training completed today  Goals Unmet:  Not Applicable  Comments: Service time is from 1330 to 1515   Dr. Rush Farmer is Medical Director for Pulmonary Rehab at [facility]

## 2017-09-01 ENCOUNTER — Encounter: Payer: Self-pay | Admitting: Internal Medicine

## 2017-09-01 ENCOUNTER — Encounter (HOSPITAL_COMMUNITY)
Admission: RE | Admit: 2017-09-01 | Discharge: 2017-09-01 | Disposition: A | Discharge status: Home / Self Care | Payer: Medicare Other | Source: Ambulatory Visit | Attending: Internal Medicine | Admitting: Internal Medicine

## 2017-09-01 VITALS — Wt 207.7 lb

## 2017-09-01 DIAGNOSIS — Z803 Family history of malignant neoplasm of breast: Secondary | ICD-10-CM | POA: Diagnosis not present

## 2017-09-01 DIAGNOSIS — J8410 Pulmonary fibrosis, unspecified: Secondary | ICD-10-CM | POA: Diagnosis not present

## 2017-09-01 DIAGNOSIS — J449 Chronic obstructive pulmonary disease, unspecified: Secondary | ICD-10-CM | POA: Diagnosis not present

## 2017-09-01 DIAGNOSIS — Z7951 Long term (current) use of inhaled steroids: Secondary | ICD-10-CM | POA: Diagnosis not present

## 2017-09-01 DIAGNOSIS — Z79899 Other long term (current) drug therapy: Secondary | ICD-10-CM | POA: Diagnosis not present

## 2017-09-01 DIAGNOSIS — J9610 Chronic respiratory failure, unspecified whether with hypoxia or hypercapnia: Secondary | ICD-10-CM | POA: Diagnosis not present

## 2017-09-01 DIAGNOSIS — Z7982 Long term (current) use of aspirin: Secondary | ICD-10-CM | POA: Diagnosis not present

## 2017-09-01 LAB — HM MAMMOGRAPHY

## 2017-09-01 NOTE — Progress Notes (Signed)
Daily Session Note  Patient Details  Name: [name] MRN: [id number] Date of Birth: 01/23/1946 Referring Provider:     Pulmonary Rehab Walk Test from 05/21/2017 in [place]  Referring Provider  Dr. Melvyn Novas      Encounter Date: 09/01/2017  Check In:     Session Check In - 09/01/17 1322      Check-In   Location [facility]   Staff Present Su Hilt, MS, ACSM RCEP, Exercise Physiologist;Lc Joynt Leonia Reeves, RN, BSN   Supervising physician immediately available to respond to emergencies [facility] immediately available   Physician(s) Dr. Wendee Beavers   Medication changes reported     No   Fall or balance concerns reported    No   Tobacco Cessation No Change   Warm-up and Cool-down Performed as group-led instruction   Resistance Training Performed Yes   VAD Patient? No     Pain Assessment   Currently in Pain? No/denies   Multiple Pain Sites No      Capillary Blood Glucose: No results found for this or any previous visit (from the past 24 hour(s)).      Exercise Prescription Changes - 09/01/17 1500      Response to Exercise   Blood Pressure (Admit) 130/70   Blood Pressure (Exercise) 130/80   Blood Pressure (Exit) 126/70   Heart Rate (Admit) 82 bpm   Heart Rate (Exercise) 116 bpm   Heart Rate (Exit) 100 bpm   Oxygen Saturation (Admit) 94 %   Oxygen Saturation (Exercise) 92 %   Oxygen Saturation (Exit) 96 %   Rating of Perceived Exertion (Exercise) 13   Perceived Dyspnea (Exercise) 3   Duration Continue with 45 min of aerobic exercise without signs/symptoms of physical distress.   Intensity THRR unchanged     Progression   Progression Continue to progress workloads to maintain intensity without signs/symptoms of physical distress.     Resistance Training   Training Prescription Yes   Weight blue bands   Reps 10-15   Time 10 Minutes     Interval Training   Interval Training No     Oxygen   Oxygen Continuous    Liters 4     NuStep   Level 6   Minutes 17   METs 2.8     Arm Ergometer   Level 7   Minutes 17     Track   Laps 11   Minutes 17      History  Smoking Status  . Former Smoker  . Packs/day: 1.50  . Years: 58.50  . Types: Cigarettes  . Quit date: 03/03/2005  Smokeless Tobacco  . Never Used    Goals Met:  Proper associated with RPD/PD & O2 Sat Strength training completed today  Goals Unmet:  Not Applicable  Comments: Service time is from 1330 to 1500    Dr. Rush Farmer is Medical Director for Pulmonary Rehab at [facility].

## 2017-09-02 ENCOUNTER — Encounter: Payer: Self-pay | Admitting: Internal Medicine

## 2017-09-03 ENCOUNTER — Encounter (HOSPITAL_COMMUNITY): Payer: Medicare Other

## 2017-09-15 ENCOUNTER — Encounter (HOSPITAL_COMMUNITY)
Admission: RE | Admit: 2017-09-15 | Discharge: 2017-09-15 | Disposition: A | Discharge status: Home / Self Care | Payer: Medicare Other | Source: Ambulatory Visit | Attending: Internal Medicine | Admitting: Internal Medicine

## 2017-09-15 ENCOUNTER — Ambulatory Visit (INDEPENDENT_AMBULATORY_CARE_PROVIDER_SITE_OTHER): Payer: Medicare Other | Admitting: Pulmonary Disease

## 2017-09-15 ENCOUNTER — Encounter: Payer: Self-pay | Admitting: Pulmonary Disease

## 2017-09-15 ENCOUNTER — Ambulatory Visit (INDEPENDENT_AMBULATORY_CARE_PROVIDER_SITE_OTHER)
Admission: RE | Admit: 2017-09-15 | Discharge: 2017-09-15 | Disposition: A | Discharge status: Home / Self Care | Payer: Medicare Other | Source: Ambulatory Visit | Attending: Pulmonary Disease | Admitting: Pulmonary Disease

## 2017-09-15 VITALS — BP 140/72 | HR 88 | Ht 59.0 in | Wt 205.0 lb

## 2017-09-15 DIAGNOSIS — I10 Essential (primary) hypertension: Secondary | ICD-10-CM | POA: Insufficient documentation

## 2017-09-15 DIAGNOSIS — J9612 Chronic respiratory failure with hypercapnia: Secondary | ICD-10-CM

## 2017-09-15 DIAGNOSIS — J8410 Pulmonary fibrosis, unspecified: Secondary | ICD-10-CM | POA: Insufficient documentation

## 2017-09-15 DIAGNOSIS — J209 Acute bronchitis, unspecified: Secondary | ICD-10-CM

## 2017-09-15 DIAGNOSIS — R0602 Shortness of breath: Secondary | ICD-10-CM

## 2017-09-15 DIAGNOSIS — Z87891 Personal history of nicotine dependence: Secondary | ICD-10-CM | POA: Insufficient documentation

## 2017-09-15 DIAGNOSIS — I252 Old myocardial infarction: Secondary | ICD-10-CM | POA: Insufficient documentation

## 2017-09-15 DIAGNOSIS — M1711 Unilateral primary osteoarthritis, right knee: Secondary | ICD-10-CM | POA: Insufficient documentation

## 2017-09-15 DIAGNOSIS — L4050 Arthropathic psoriasis, unspecified: Secondary | ICD-10-CM | POA: Insufficient documentation

## 2017-09-15 DIAGNOSIS — Z7951 Long term (current) use of inhaled steroids: Secondary | ICD-10-CM | POA: Insufficient documentation

## 2017-09-15 DIAGNOSIS — J9610 Chronic respiratory failure, unspecified whether with hypoxia or hypercapnia: Secondary | ICD-10-CM | POA: Insufficient documentation

## 2017-09-15 DIAGNOSIS — Z79899 Other long term (current) drug therapy: Secondary | ICD-10-CM | POA: Insufficient documentation

## 2017-09-15 DIAGNOSIS — E039 Hypothyroidism, unspecified: Secondary | ICD-10-CM | POA: Insufficient documentation

## 2017-09-15 DIAGNOSIS — J449 Chronic obstructive pulmonary disease, unspecified: Secondary | ICD-10-CM | POA: Insufficient documentation

## 2017-09-15 DIAGNOSIS — Z7982 Long term (current) use of aspirin: Secondary | ICD-10-CM | POA: Insufficient documentation

## 2017-09-15 DIAGNOSIS — J9611 Chronic respiratory failure with hypoxia: Secondary | ICD-10-CM | POA: Diagnosis not present

## 2017-09-15 DIAGNOSIS — K219 Gastro-esophageal reflux disease without esophagitis: Secondary | ICD-10-CM | POA: Insufficient documentation

## 2017-09-15 MED ORDER — METHYLPREDNISOLONE 4 MG PO TBPK: ORAL_TABLET | ORAL | 0 refills | Status: DC

## 2017-09-15 NOTE — Assessment & Plan Note (Signed)
Not sure what is making her acutely worse. Objectively her oxygenation is not worse.  We will get a chest x-ray for completion since exam unreliable due to chronic wheezing. We will treat her with Medrol Dosepak.  She will call for antibiotic if sputum changes color. 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

## 2017-09-15 NOTE — Progress Notes (Signed)
   Subjective:    Patient ID: [name], female    DOB: [birth year], 71 y.o.   MRN: [id number]  HPI  Chief Complaint  Patient presents with  . Acute Visit    Per patient, increased SOB for the past few weeks. Currently on 3L of o2. Has a history of ARDS. Was not able to complete pulm rehab due to SOB.    71 yowf quit smoking in May 2006 with pneumococcal Pna/ ards   She is maintained chronically on symbicort despite no sign airflow obst on pfts which was confirmed  06/2015  She has been maintained on oxygen since 2011, likely on the basis of chronic pulmonary fibrosis especially upper lobes on review of her prior CT. She undergoes rehab twice a week and has missed her last 3 appointments due to increased shortness of breath for the past week.  She denies preceding URI symptoms but reports a dry cough, one occasion of nasal bleeding spontaneously which resolved.  She has used her rescue MDI without relief.  She remains compliant with Symbicort. She always has a wheeze.  She denies pedal edema or paroxysmal nocturnal dyspnea. Her oxygen status is no worse with 96% on 2 L pulse today chest x-ray 02/2017 shows bilateral interstitial fibrosis   She sleeps with 3 pillows which is normal for her. Loud snoring has been noted by family members.     Significant tests/ events reviewed         - CT chest 07/03/10 Nonspecific PF mostly upper lobes       - CT chest 12/03/10 acute gg changes and effusions c/w chf        - PFT's  04/12/10 FEV1  1.21 (69%) ratio 77 and no change p B2,  DLC0 56%   Past Medical History:  Diagnosis Date  . Arthritis    OA RIGHT KNEE WITH PAIN  . Barrett esophagus   . Chronic respiratory failure (HCC)   . COPD (chronic obstructive pulmonary disease) (HCC)   . GERD (gastroesophageal reflux disease)   . History of ARDS 2006  . History of home oxygen therapy    AT NIGHT WHEN SLEEPING 2 L / MIN NASAL CANNULA  . Hypertension   . Hypothyroidism   . NSTEMI (non-ST  elevated myocardial infarction) (HCC) 05/31/2015  . Pneumococcal pneumonia (HCC) 2006   HOSPITALIZED AND DEVELOPED ARDS  . Psoriatic arthritis (HCC)   . Pulmonary fibrosis (HCC)   . Rheumatic disease   . SOB (shortness of breath) on exertion      Review of Systems   neg for any significant sore throat, dysphagia, itching, sneezing, nasal congestion or excess/ purulent secretions, fever, chills, sweats, unintended wt loss, pleuritic or exertional cp, hempoptysis, orthopnea pnd or change in chronic leg swelling. Also denies presyncope, palpitations, heartburn, abdominal pain, nausea, vomiting, diarrhea or change in bowel or urinary habits, dysuria,hematuria, rash, arthralgias, visual complaints, headache, numbness weakness or ataxia.     Objective:   Physical Exam   Gen. Pleasant, obese, in no distress ENT - no lesions, no post nasal drip Neck: No JVD, no thyromegaly, no carotid bruits Lungs: no use of accessory muscles, no dullness to percussion, BL diffuse rhonchi  Cardiovascular: Rhythm regular, heart sounds  normal, no murmurs or gallops, no peripheral edema Musculoskeletal: No deformities, no cyanosis or clubbing , no tremors        Assessment & Plan:

## 2017-09-15 NOTE — Assessment & Plan Note (Signed)
Continue 2 L pulse in the daytime Would consider attended polysomnogram in the future once acute symptoms are resolved to rule out obstructive sleep apnea.  She has gained 35 pounds in the last few years and has snoring and some increase in daytime fatigue

## 2017-09-15 NOTE — Patient Instructions (Signed)
CXR today Prednisone dosepak Call for antibiotic if you develop yellow green phlegm   Sleep study in the future

## 2017-09-15 NOTE — Assessment & Plan Note (Signed)
Chronic, prior CT reviewed, obtain chest x-ray to evaluate.  Should be nonprogressive if related to insult from 2006

## 2017-09-17 ENCOUNTER — Encounter (HOSPITAL_COMMUNITY)
Admission: RE | Admit: 2017-09-17 | Discharge: 2017-09-17 | Disposition: A | Discharge status: Home / Self Care | Payer: Medicare Other | Source: Ambulatory Visit

## 2017-09-17 ENCOUNTER — Telehealth (HOSPITAL_COMMUNITY): Payer: Self-pay | Admitting: *Deleted

## 2017-09-17 DIAGNOSIS — J8410 Pulmonary fibrosis, unspecified: Secondary | ICD-10-CM

## 2017-09-17 NOTE — Progress Notes (Signed)
Pulmonary Individual Treatment Plan  Patient Details  Name: [name] MRN: [id number] Date of Birth: 1946/04/02 Referring Provider:     Pulmonary Rehab Walk Test from 05/21/2017 in [place]  Referring Provider  Dr. Melvyn Novas      Initial Encounter Date:    Pulmonary Rehab Walk Test from 05/21/2017 in [place]  Date  05/22/17  Referring Provider  Dr. Melvyn Novas      Visit Diagnosis: Pulmonary fibrosis ([place])  Patient's Home Medications on Admission:   Current Outpatient Medications:  .  acetaminophen (TYLENOL ARTHRITIS PAIN) 650 MG CR tablet, Take 1,300 mg by mouth every 8 (eight) hours as needed for pain. Per bottle directions as needed, Disp: , Rfl:  .  albuterol (PROAIR HFA) 108 (90 Base) MCG/ACT inhaler, 2 puffs every 4 hours as needed only  if your can't catch your breath, Disp: 1 Inhaler, Rfl: 11 .  aspirin EC 325 MG EC tablet, Take 1 tablet (325 mg total) by mouth daily., Disp: 30 tablet, Rfl: 0 .  atorvastatin (LIPITOR) 80 MG tablet, Take 1 tablet (80 mg total) by mouth daily at 6 PM., Disp: 90 tablet, Rfl: 3 .  budesonide-formoterol (SYMBICORT) 160-4.5 MCG/ACT inhaler, Inhale 2 puffs into the lungs 2 (two) times daily., Disp: 3 Inhaler, Rfl: 6 .  Certolizumab Pegol (CIMZIA [place]), Inject into the skin as directed. Cimzia, Injected by provider as directed, Disp: , Rfl:  .  Cholecalciferol (VITAMIN D3) 2000 UNITS TABS, Take 2,000 Int'l Units by mouth daily., Disp: , Rfl:  .  citalopram (CELEXA) 40 MG tablet, Take 1 tablet (40 mg total) by mouth every morning., Disp: 90 tablet, Rfl: 0 .  famotidine (PEPCID) 20 MG tablet, One at bedtime, Disp: , Rfl:  .  furosemide (LASIX) 20 MG tablet, Take 1 tablet (20 mg total) by mouth 2 (two) times daily., Disp: 180 tablet, Rfl: 3 .  levothyroxine (SYNTHROID, LEVOTHROID) 150 MCG tablet, TAKE 1 TABLET BY MOUTH  DAILY BEFORE BREAKFAST, Disp: 90 tablet, Rfl: 3 .  losartan (COZAAR) 50 MG  tablet, TAKE 1 TABLET BY MOUTH  DAILY, Disp: 90 tablet, Rfl: 3 .  methylPREDNISolone (MEDROL DOSEPAK) 4 MG TBPK tablet, follow package directions, Disp: 21 tablet, Rfl: 0 .  montelukast (SINGULAIR) 10 MG tablet, Take 1 tablet (10 mg total) by mouth at bedtime., Disp: 90 tablet, Rfl: 3 .  NON FORMULARY, 2lpm with sleep and with exertion if needed, Disp: , Rfl:  .  pantoprazole (PROTONIX) 40 MG tablet, TAKE 1 TABLET BY MOUTH  DAILY 30 TO 60 MINUTES  BEFORE FIRST MEAL OF THE  DAY, Disp: 90 tablet, Rfl: 2 .  traMADol (ULTRAM) 50 MG tablet, TAKE 1-2 TABLETS EVERY 6 HOURS AS NEEDED FOR PAIN, Disp: 90 tablet, Rfl: 0 .  triamcinolone cream (KENALOG) 0.1 %, Apply 1 application topically 2 (two) times daily as needed (for psorisis)., Disp: , Rfl:   Past Medical History: Past Medical History:  Diagnosis Date  . Arthritis    OA RIGHT KNEE WITH PAIN  . Barrett esophagus   . Chronic respiratory failure ([place])   . COPD (chronic obstructive pulmonary disease) ([place])   . GERD (gastroesophageal reflux disease)   . History of ARDS 2006  . History of home oxygen therapy    AT NIGHT WHEN SLEEPING 2 L / MIN NASAL CANNULA  . Hypertension   . Hypothyroidism   . NSTEMI (non-ST elevated myocardial infarction) ([place]) 05/31/2015  . Pneumococcal pneumonia ([place])  2006   HOSPITALIZED AND DEVELOPED ARDS  . Psoriatic arthritis ([place])   . Pulmonary fibrosis ([place])   . Rheumatic disease   . SOB (shortness of breath) on exertion     Tobacco Use: Social History   Tobacco Use  Smoking Status Former Smoker  . Packs/day: 1.50  . Years: 58.50  . Pack years: 87.75  . Types: Cigarettes  . Last attempt to quit: 03/03/2005  . Years since quitting: 12.5  Smokeless Tobacco Never Used    Labs: Recent Review Flowsheet Data    Labs for ITP Cardiac and Pulmonary Rehab Latest Ref Rng & Units 06/05/2015 06/05/2015 06/05/2015 07/12/2015 09/29/2016   Cholestrol <200 mg/dL - - - 117 157   LDLCALC <100 mg/dL - - - 51 61   LDLDIRECT mg/dL -  - - - -   HDL >50 mg/dL - - - 51 76   Trlycerides <150 mg/dL - - - 75 101   Hemoglobin A1c 4.8 - 5.6 % - - - - -   PHART 7.350 - 7.450 7.268(L) - - - -   PCO2ART 35.0 - 45.0 mmHg 55.6(H) - - - -   HCO3 20.0 - 24.0 mEq/L 25.5(H) - - - -   TCO2 0 - 100 mmol/L _0 - -   ACIDBASEDEF 0.0 - 2.0 mmol/L 2.0 - - - -   O2SAT % 98.0 - - - -      Capillary Blood Glucose: Lab Results  Component Value Date   GLUCAP 113 (H) 06/06/2015   GLUCAP 107 (H) 06/06/2015   GLUCAP 128 (H) 06/05/2015   GLUCAP 114 (H) 06/05/2015   GLUCAP 109 (H) 06/05/2015     Pulmonary Assessment Scores: Pulmonary Assessment Scores    Row Name 05/22/17 1008         ADL UCSD   ADL Phase  Entry       mMRC Score   mMRC Score  1        Pulmonary Function Assessment: Pulmonary Function Assessment - 05/11/17 1103      Breath   Bilateral Breath Sounds  Clear;Decreased    Shortness of Breath  Yes;Limiting activity       Exercise Target Goals:    Exercise Program Goal: Individual exercise prescription set with THRR, safety & activity barriers. Participant demonstrates ability to understand and report RPE using BORG scale, to self-measure pulse accurately, and to acknowledge the importance of the exercise prescription.  Exercise Prescription Goal: Starting with aerobic activity 30 plus minutes a day, 3 days per week for initial exercise prescription. Provide home exercise prescription and guidelines that participant acknowledges understanding prior to discharge.  Activity Barriers & Risk Stratification:   6 Minute Walk: 6 Minute Walk    Row Name 05/22/17 1001 05/22/17 1007       6 Minute Walk   Phase  Initial  -    Distance  1010 feet  -    Walk Time  6 minutes  -    # of Rest Breaks  0  -    MPH  1.91  -    METS  2.45  -    RPE  17  -    Perceived Dyspnea   3  -    Symptoms  Yes (comment)  -    Comments  used wheelchair  -    Resting HR  88 bpm  -    Resting BP  139/76  -    Max Ex.  HR   140 bpm  -    Max Ex. BP  197/100 RECHECK: 209/75, 188/94, after seated for 20 minutes 154/84  -      Interval HR   Baseline HR (retired)  88  -    1 Minute HR  116  -    2 Minute HR  118  -    3 Minute HR  126  -    4 Minute HR  136  -    5 Minute HR  140  -    6 Minute HR  136  -    2 Minute Post HR  121  -    Interval Heart Rate?  Yes  -      Interval Oxygen   Interval Oxygen?  Yes  -    Baseline Oxygen Saturation %  92 %  -    Resting Liters of Oxygen  2 L  -    1 Minute Oxygen Saturation %  87 %  -    1 Minute Liters of Oxygen  2 L  -    2 Minute Oxygen Saturation %  86 %  -    2 Minute Liters of Oxygen  2 L  -    3 Minute Oxygen Saturation %  86 %  -    3 Minute Liters of Oxygen  2 L  -    4 Minute Oxygen Saturation %  91 %  -    4 Minute Liters of Oxygen  2 L  3 L    5 Minute Oxygen Saturation %  93 %  -    5 Minute Liters of Oxygen  2 L  3 L    6 Minute Oxygen Saturation %  90 %  -    6 Minute Liters of Oxygen  2 L  3 L    2 Minute Post Oxygen Saturation %  94 %  -    2 Minute Post Liters of Oxygen  2 L  3 L       Oxygen Initial Assessment: Oxygen Initial Assessment - 05/22/17 1007      Initial 6 min Walk   Oxygen Used  Continuous;E-Tanks    Liters per minute  2    Resting Oxygen Saturation   92 %    Exercise Oxygen Saturation  during 6 min walk  86 % INCREASED TO 3 LITERS      Program Oxygen Prescription   Program Oxygen Prescription  Continuous;E-Tanks    Liters per minute  3       Oxygen Re-Evaluation: Oxygen Re-Evaluation    Row Name 06/02/17 1717 07/01/17 2113 07/24/17 1345 08/24/17 0742 09/14/17 1412     Program Oxygen Prescription   Program Oxygen Prescription  Continuous;E-Tanks  Continuous;E-Tanks  Continuous;E-Tanks  Continuous;E-Tanks  Continuous;E-Tanks   Liters per minute  _0 Home Oxygen   Home Oxygen Device  Home Concentrator;Portable Concentrator;E-Tanks  Home Concentrator;Portable Concentrator;E-Tanks  Home  Concentrator;Portable Concentrator;E-Tanks  Home Concentrator;Portable Concentrator;E-Tanks  Home Concentrator;Portable Concentrator;E-Tanks   Sleep Oxygen Prescription  Continuous  Continuous  Continuous  Continuous  Continuous   Liters per minute  _1 -  2   Home Exercise Oxygen Prescription  Continuous  Continuous  Continuous  Continuous  Continuous   Liters per minute  _2 Home at Rest Exercise  Oxygen Prescription  None  None  None  None  None   Compliance with Home Oxygen Use  Yes  Yes  Yes  Yes  Yes     Goals/Expected Outcomes   Short Term Goals  To learn and exhibit compliance with exercise, home and travel O2 prescription;To learn and understand importance of monitoring SPO2 with pulse oximeter and demonstrate accurate use of the pulse oximeter.;To Learn and understand importance of maintaining oxygen saturations>88%;To learn and demonstrate proper purse lipped breathing techniques or other breathing techniques.;To learn and demonstrate proper use of respiratory medications  To learn and exhibit compliance with exercise, home and travel O2 prescription;To learn and understand importance of monitoring SPO2 with pulse oximeter and demonstrate accurate use of the pulse oximeter.;To learn and understand importance of maintaining oxygen saturations>88%;To learn and demonstrate proper pursed lip breathing techniques or other breathing techniques.;To learn and demonstrate proper use of respiratory medications  To learn and exhibit compliance with exercise, home and travel O2 prescription;To learn and understand importance of monitoring SPO2 with pulse oximeter and demonstrate accurate use of the pulse oximeter.;To learn and understand importance of maintaining oxygen saturations>88%;To learn and demonstrate proper pursed lip breathing techniques or other breathing techniques.;To learn and demonstrate proper use of respiratory medications  To learn and exhibit compliance with exercise,  home and travel O2 prescription;To learn and understand importance of monitoring SPO2 with pulse oximeter and demonstrate accurate use of the pulse oximeter.;To learn and understand importance of maintaining oxygen saturations>88%;To learn and demonstrate proper pursed lip breathing techniques or other breathing techniques.;To learn and demonstrate proper use of respiratory medications  To learn and exhibit compliance with exercise, home and travel O2 prescription;To learn and understand importance of monitoring SPO2 with pulse oximeter and demonstrate accurate use of the pulse oximeter.;To learn and understand importance of maintaining oxygen saturations>88%;To learn and demonstrate proper pursed lip breathing techniques or other breathing techniques.;To learn and demonstrate proper use of respiratory medications   Long  Term Goals  Exhibits compliance with exercise, home and travel O2 prescription;Verbalizes importance of monitoring SPO2 with pulse oximeter and return demonstration;Maintenance of O2 saturations>88%;Exhibits proper breathing techniques, such as purse lipped breathing or other method taught during program session;Compliance with respiratory medication  Exhibits compliance with exercise, home and travel O2 prescription;Verbalizes importance of monitoring SPO2 with pulse oximeter and return demonstration;Maintenance of O2 saturations>88%;Exhibits proper breathing techniques, such as pursed lip breathing or other method taught during program session;Compliance with respiratory medication  Exhibits compliance with exercise, home and travel O2 prescription;Verbalizes importance of monitoring SPO2 with pulse oximeter and return demonstration;Maintenance of O2 saturations>88%;Exhibits proper breathing techniques, such as pursed lip breathing or other method taught during program session;Compliance with respiratory medication  Exhibits compliance with exercise, home and travel O2  prescription;Verbalizes importance of monitoring SPO2 with pulse oximeter and return demonstration;Maintenance of O2 saturations>88%;Exhibits proper breathing techniques, such as pursed lip breathing or other method taught during program session;Compliance with respiratory medication  Exhibits compliance with exercise, home and travel O2 prescription;Verbalizes importance of monitoring SPO2 with pulse oximeter and return demonstration;Maintenance of O2 saturations>88%;Exhibits proper breathing techniques, such as pursed lip breathing or other method taught during program session;Compliance with respiratory medication   Comments  -  goals met  patient does not have adequate oxygen for home exercise. will work with MD and oxygen supply company to obtain adequate oxygen for home exercise.  patient now has adequate home oxygen for exercise  patient now has adequate home oxygen for exercise  Oxygen Discharge (Final Oxygen Re-Evaluation): Oxygen Re-Evaluation - 09/14/17 1412      Program Oxygen Prescription   Program Oxygen Prescription  Continuous;E-Tanks    Liters per minute  3      Home Oxygen   Home Oxygen Device  Home Concentrator;Portable Concentrator;E-Tanks    Sleep Oxygen Prescription  Continuous    Liters per minute  2    Home Exercise Oxygen Prescription  Continuous    Liters per minute  4    Home at Rest Exercise Oxygen Prescription  None    Compliance with Home Oxygen Use  Yes      Goals/Expected Outcomes   Short Term Goals  To learn and exhibit compliance with exercise, home and travel O2 prescription;To learn and understand importance of monitoring SPO2 with pulse oximeter and demonstrate accurate use of the pulse oximeter.;To learn and understand importance of maintaining oxygen saturations>88%;To learn and demonstrate proper pursed lip breathing techniques or other breathing techniques.;To learn and demonstrate proper use of respiratory medications    Long  Term Goals   Exhibits compliance with exercise, home and travel O2 prescription;Verbalizes importance of monitoring SPO2 with pulse oximeter and return demonstration;Maintenance of O2 saturations>88%;Exhibits proper breathing techniques, such as pursed lip breathing or other method taught during program session;Compliance with respiratory medication    Comments  patient now has adequate home oxygen for exercise       Initial Exercise Prescription: Initial Exercise Prescription - 05/22/17 1000      Date of Initial Exercise RX and Referring Provider   Date  05/22/17    Referring Provider  Dr. Melvyn Novas      Oxygen   Oxygen  Continuous    Liters  3      NuStep   Level  2    Minutes  17    METs  1.5      Arm Ergometer   Level  2    Minutes  17      Track   Laps  10    Minutes  17      Prescription Details   Frequency (times per week)  2    Duration  Progress to 45 minutes of aerobic exercise without signs/symptoms of physical distress      Intensity   THRR 40-80% of Max Heartrate  60-120    Ratings of Perceived Exertion  11-13    Perceived Dyspnea  0-4      Progression   Progression  Continue progressive overload as per policy without signs/symptoms or physical distress.      Resistance Training   Training Prescription  Yes    Weight  blue bands    Reps  10-15       Perform Capillary Blood Glucose checks as needed.  Exercise Prescription Changes: Exercise Prescription Changes    Row Name 05/28/17 1500 06/02/17 1500 06/04/17 1500 06/09/17 1500 06/16/17 1500     Response to Exercise   Blood Pressure (Admit)  144/64  138/70  164/70  138/60  138/70   Blood Pressure (Exercise)  160/76  168/86  150/78  140/64  168/74   Blood Pressure (Exit)  140/72  140/70  136/64  124/62  138/76   Heart Rate (Admit)  96 bpm  91 bpm  88 bpm  90 bpm  93 bpm   Heart Rate (Exercise)  116 bpm  116 bpm  121 bpm  127 bpm  127 bpm   Heart Rate (Exit)  104 bpm  98 bpm  97 bpm  99 bpm  108 bpm   Oxygen  Saturation (Admit)  90 %  91 %  87 %  98 %  93 %   Oxygen Saturation (Exercise)  90 %  93 %  89 %  92 %  88 %   Oxygen Saturation (Exit)  92 %  96 %  96 %  99 %  97 %   Rating of Perceived Exertion (Exercise)  _0 Perceived Dyspnea (Exercise)  _1 Duration  Continue with 45 min of aerobic exercise without signs/symptoms of physical distress.  Continue with 45 min of aerobic exercise without signs/symptoms of physical distress.  Continue with 45 min of aerobic exercise without signs/symptoms of physical distress.  Continue with 45 min of aerobic exercise without signs/symptoms of physical distress.  Continue with 45 min of aerobic exercise without signs/symptoms of physical distress.   Intensity  THRR unchanged  THRR unchanged  THRR unchanged  THRR unchanged  THRR unchanged     Progression   Progression  Continue to progress workloads to maintain intensity without signs/symptoms of physical distress.  Continue to progress workloads to maintain intensity without signs/symptoms of physical distress.  Continue to progress workloads to maintain intensity without signs/symptoms of physical distress.  Continue to progress workloads to maintain intensity without signs/symptoms of physical distress.  Continue to progress workloads to maintain intensity without signs/symptoms of physical distress.     Resistance Training   Training Prescription  Yes  Yes  Yes  Yes  Yes   Weight  blue bands  blue bands  blue bands  blue bands  blue bands   Reps  10-15  10-15  10-15  10-15  10-15   Time  -  -  -  10 Minutes  10 Minutes     Oxygen   Oxygen  Continuous  Continuous  Continuous  Continuous  Continuous   Liters  _2 NuStep   Level  2  3  -  4  4   Minutes  17  17  -  17  17   METs  1.7  1.9  -  2  1.6     Arm Ergometer   Level  -  _3 Minutes  -  _4 Track   Laps  _5 Minutes  _6 Row Name 06/18/17 1500  06/23/17 1500 06/25/17 1555 07/02/17 1600 07/21/17 1550     Response to Exercise   Blood Pressure (Admit)  134/54  170/74  144/64  118/70  126/62   Blood Pressure (Exercise)  146/60  148/76  160/60  180/70  160/60   Blood Pressure (Exit)  118/60  124/72  144/62  120/62  124/60   Heart Rate (Admit)  89 bpm  101 bpm  80 bpm  85 bpm  92 bpm   Heart Rate (Exercise)  128 bpm  120 bpm  109 bpm  129 bpm  124 bpm   Heart Rate (Exit)  108 bpm  99 bpm  90 bpm  95 bpm  102 bpm   Oxygen Saturation (Admit)  89 %  94 %  95 %  97 %  95 %   Oxygen Saturation (Exercise)  88 %  91 %  93 %  95 %  85 %   Oxygen Saturation (Exit)  94 %  98 %  97 %  99 %  95 %   Rating of Perceived Exertion (Exercise)  _0 Perceived Dyspnea (Exercise)  _1 Duration  Continue with 45 min of aerobic exercise without signs/symptoms of physical distress.  Continue with 45 min of aerobic exercise without signs/symptoms of physical distress.  Continue with 45 min of aerobic exercise without signs/symptoms of physical distress.  Continue with 45 min of aerobic exercise without signs/symptoms of physical distress.  Continue with 45 min of aerobic exercise without signs/symptoms of physical distress.   Intensity  THRR unchanged  THRR unchanged  THRR unchanged  THRR unchanged  THRR unchanged     Progression   Progression  Continue to progress workloads to maintain intensity without signs/symptoms of physical distress.  Continue to progress workloads to maintain intensity without signs/symptoms of physical distress.  Continue to progress workloads to maintain intensity without signs/symptoms of physical distress.  Continue to progress workloads to maintain intensity without signs/symptoms of physical distress.  Continue to progress workloads to maintain intensity without signs/symptoms of physical distress.     Resistance Training   Training Prescription  Yes  Yes  Yes  Yes  Yes   Weight  blue bands  blue bands   blue bands  blue bands  blue bands   Reps  10-15  10-15  10-15  10-15  10-15   Time  10 Minutes  10 Minutes  10 Minutes  10 Minutes  10 Minutes     Oxygen   Oxygen  Continuous  Continuous  Continuous  Continuous  Continuous   Liters  _2 NuStep   Level  4  -  _3 Minutes  17  -  _4 METs  2.1  -  2.4  1.9  2.4     Arm Ergometer   Level  _5 -  4   Minutes  _6 -  17     Track   Laps  11  11  -  11  6   Minutes  17  17  -  17  17   Row Name 07/23/17 1600 07/28/17 1500 07/30/17 1500 08/04/17 1600 08/06/17 1600     Response to Exercise   Blood Pressure (Admit)  124/68  140/64  150/72  134/62  124/60   Blood Pressure (Exercise)  162/60  134/70  166/62  140/64  144/70   Blood Pressure (Exit)  118/68  120/66  131/79  124/56  104/60   Heart Rate (Admit)  89 bpm  91 bpm  87 bpm  93 bpm  90 bpm   Heart Rate (Exercise)  122 bpm  125 bpm  111 bpm  130 bpm  135 bpm   Heart Rate (Exit)  96 bpm  98 bpm  90 bpm  107 bpm  107 bpm   Oxygen Saturation (Admit)  96 %  93 %  96 %  96 %  96 %   Oxygen Saturation (  Exercise)  92 %  93 %  92 %  90 %  92 %   Oxygen Saturation (Exit)  97 %  97 %  97 %  98 %  95 %   Rating of Perceived Exertion (Exercise)  _0 Perceived Dyspnea (Exercise)  _1 Duration  Continue with 45 min of aerobic exercise without signs/symptoms of physical distress.  Continue with 45 min of aerobic exercise without signs/symptoms of physical distress.  Continue with 45 min of aerobic exercise without signs/symptoms of physical distress.  Continue with 45 min of aerobic exercise without signs/symptoms of physical distress.  Continue with 45 min of aerobic exercise without signs/symptoms of physical distress.   Intensity  THRR unchanged  THRR unchanged  THRR unchanged  THRR unchanged  THRR unchanged     Progression   Progression  Continue to progress workloads to maintain intensity without signs/symptoms of physical  distress.  Continue to progress workloads to maintain intensity without signs/symptoms of physical distress.  Continue to progress workloads to maintain intensity without signs/symptoms of physical distress.  Continue to progress workloads to maintain intensity without signs/symptoms of physical distress.  Continue to progress workloads to maintain intensity without signs/symptoms of physical distress.     Resistance Training   Training Prescription  Yes  Yes  Yes  Yes  Yes   Weight  blue bands  blue bands  blue bands  blue bands  blue bands   Reps  10-15  10-15  10-15  10-15  10-15   Time  10 Minutes  10 Minutes  10 Minutes  10 Minutes  10 Minutes     Oxygen   Oxygen  Continuous  Continuous  Continuous  Continuous  Continuous   Liters  3  3-4  3-4  3-4  3-4     NuStep   Level  _2 Minutes  _3 METs  1.2  2.2  2.2  2.4  2.7     Arm Ergometer   Level  -  _4 -   Minutes  -  _5 -     Track   Laps  8  8  -  8  10   Minutes  17  17  -  17  17     Home Exercise Plan   Plans to continue exercise at  Home (comment)  -  -  -  -   Frequency  Add 3 additional days to program exercise sessions.  -  -  -  -   Row Name 08/18/17 1500 08/20/17 1554 08/25/17 1538 08/27/17 1546 09/01/17 1500     Response to Exercise   Blood Pressure (Admit)  168/70  150/64  110/60  142/58  130/70   Blood Pressure (Exercise)  140/60  170/62  140/76  150/70  130/80   Blood Pressure (Exit)  134/62  138/80  120/70  140/80  126/70   Heart Rate (Admit)  106 bpm  76 bpm  87 bpm  98 bpm  82 bpm   Heart Rate (Exercise)  115 bpm  124 bpm  136 bpm  131 bpm  116 bpm   Heart Rate (Exit)  97 bpm  97 bpm  110 bpm  106 bpm  100 bpm   Oxygen Saturation (Admit)  91 %  93 %  96 %  94 %  94 %   Oxygen Saturation (Exercise)  92 %  87 %  90 %  86 %  92 %   Oxygen Saturation (Exit)  96 %  96 %  98 %  96 %  96 %   Rating of Perceived Exertion (Exercise)  _0 Perceived  Dyspnea (Exercise)  _1 Duration  Continue with 45 min of aerobic exercise without signs/symptoms of physical distress.  Continue with 45 min of aerobic exercise without signs/symptoms of physical distress.  Continue with 45 min of aerobic exercise without signs/symptoms of physical distress.  Continue with 45 min of aerobic exercise without signs/symptoms of physical distress.  Continue with 45 min of aerobic exercise without signs/symptoms of physical distress.   Intensity  THRR unchanged  THRR unchanged  THRR unchanged  THRR unchanged  THRR unchanged     Progression   Progression  Continue to progress workloads to maintain intensity without signs/symptoms of physical distress.  Continue to progress workloads to maintain intensity without signs/symptoms of physical distress.  Continue to progress workloads to maintain intensity without signs/symptoms of physical distress.  Continue to progress workloads to maintain intensity without signs/symptoms of physical distress.  Continue to progress workloads to maintain intensity without signs/symptoms of physical distress.     Resistance Training   Training Prescription  Yes  Yes  Yes  Yes  Yes   Weight  blue bands  blue bands  blue bands  blue bands  blue bands   Reps  10-15  10-15  10-15  10-15  10-15   Time  10 Minutes  10 Minutes  10 Minutes  10 Minutes  10 Minutes     Interval Training   Interval Training  -  -  -  -  No     Oxygen   Oxygen  Continuous  Continuous  Continuous  Continuous  Continuous   Liters  3.-4  3.-_2 NuStep   Level  6  -  6  -  6   Minutes  17  -  17  -  17   METs  2.3  -  2.6  -  2.8     Arm Ergometer   Level  6  6  6._3 Minutes  34  _4 Track   Laps  -  _5 Minutes  -  _6 Exercise Comments: Exercise Comments    Row Name 07/28/17 0714           Exercise Comments  Home exercise completed          Exercise Goals and  Review:   Exercise Goals Re-Evaluation : Exercise Goals Re-Evaluation    Row Name 05/29/17 1119 06/30/17 0956 07/20/17 1419 08/22/17 1347 09/15/17 0725     Exercise Goal Re-Evaluation   Exercise Goals Review  Increase Strenth and Stamina;Increase Physical Activity  Increase Strength and Stamina;Increase Physical Activity;Able to understand and use Dyspnea scale;Able to understand and use rate of perceived exertion (RPE) scale;Knowledge and understanding of Target  Heart Rate Range (THRR);Understanding of Exercise Prescription  Increase Strength and Stamina;Increase Physical Activity;Able to understand and use rate of perceived exertion (RPE) scale;Able to understand and use Dyspnea scale;Knowledge and understanding of Target Heart Rate Range (THRR);Understanding of Exercise Prescription  Increase Strength and Stamina;Increase Physical Activity;Able to understand and use Dyspnea scale;Able to understand and use rate of perceived exertion (RPE) scale;Knowledge and understanding of Target Heart Rate Range (THRR);Understanding of Exercise Prescription  Increase Physical Activity;Increase Strength and Stamina;Able to understand and use Dyspnea scale;Able to understand and use rate of perceived exertion (RPE) scale;Knowledge and understanding of Target Heart Rate Range (THRR);Understanding of Exercise Prescription   Comments  Patient has only attended one exercise session. Will cont. to monitor and progress as able.   Patient is progressing well in program. Averages 2.0-2.4 METs. She is open to workload changes. Will cont to progress as able.   Patient has had extended absence due to family issues. Will cont. to monitor and progress as able when patient returns.   Patient is progressing well in the program. Works hard and shows motivation. 8-10 laps (200 ft each) in 15 minutes. MET levels fall in "low"category but progression is being shown. Open to workload changes. Will cont. to progress and monitor.  Patient is  progressing well in the program. Works hard and shows motivation. 11-13 laps (200 ft each) in 15 minutes. MET levels fall in "low"category but progression is being shown. Open to workload changes. Will cont. to progress and monitor.   Expected Outcomes  Through exercising at rehab and at home, patient will increase physicial capacity, strength, and stamina.   Through exercising at rehab and at home, patient will increase physicial capacity, strength, and stamina.   Through exercising at rehab and at home, patient will increase physicial capacity, strength, and stamina.   Through exercising at rehab and at home, patient will increase physicial capacity, strength, and stamina.   Through exercise at rehab and at home, patient will increase strength and stamina and find that ADL's are easier to perform.       Discharge Exercise Prescription (Final Exercise Prescription Changes): Exercise Prescription Changes - 09/01/17 1500      Response to Exercise   Blood Pressure (Admit)  130/70    Blood Pressure (Exercise)  130/80    Blood Pressure (Exit)  126/70    Heart Rate (Admit)  82 bpm    Heart Rate (Exercise)  116 bpm    Heart Rate (Exit)  100 bpm    Oxygen Saturation (Admit)  94 %    Oxygen Saturation (Exercise)  92 %    Oxygen Saturation (Exit)  96 %    Rating of Perceived Exertion (Exercise)  13    Perceived Dyspnea (Exercise)  3    Duration  Continue with 45 min of aerobic exercise without signs/symptoms of physical distress.    Intensity  THRR unchanged      Progression   Progression  Continue to progress workloads to maintain intensity without signs/symptoms of physical distress.      Resistance Training   Training Prescription  Yes    Weight  blue bands    Reps  10-15    Time  10 Minutes      Interval Training   Interval Training  No      Oxygen   Oxygen  Continuous    Liters  4      NuStep   Level  6    Minutes  17  METs  2.8      Arm Ergometer   Level  7    Minutes  17       Track   Laps  11    Minutes  17       Nutrition:  Target Goals: Understanding of nutrition guidelines, daily intake of sodium <1584m, cholesterol <2053m calories 30% from fat and 7% or less from saturated fats, daily to have 5 or more servings of fruits and vegetables.  Biometrics: Pre Biometrics - 05/11/17 1107      Pre Biometrics   Grip Strength  33 kg        Nutrition Therapy Plan and Nutrition Goals: Nutrition Therapy & Goals - 05/21/17 0829      Nutrition Therapy   Diet  General, Healthful      Personal Nutrition Goals   Nutrition Goal  Identify food quantities necessary to achieve wt loss of  -2# per week to a goal wt loss of 2.7-10.9 kg (6-24 lb) at graduation from pulmonary rehab.      Intervention Plan   Intervention  Prescribe, educate and counsel regarding individualized specific dietary modifications aiming towards targeted core components such as weight, hypertension, lipid management, diabetes, heart failure and other comorbidities.    Expected Outcomes  Short Term Goal: Understand basic principles of dietary content, such as calories, fat, sodium, cholesterol and nutrients.;Long Term Goal: Adherence to prescribed nutrition plan.       Nutrition Discharge: Rate Your Plate Scores: Nutrition Assessments - 05/21/17 0826      Rate Your Plate Scores   Pre Score  54       Nutrition Goals Re-Evaluation:   Nutrition Goals Discharge (Final Nutrition Goals Re-Evaluation):   Psychosocial: Target Goals: Acknowledge presence or absence of significant depression and/or stress, maximize coping skills, provide positive support system. Participant is able to verbalize types and ability to use techniques and skills needed for reducing stress and depression.  Initial Review & Psychosocial Screening: Initial Psych Review & Screening - 05/11/17 1113      Initial Review   Current issues with  History of Depression      Family Dynamics   Good Support System?   Yes      Barriers   Psychosocial barriers to participate in program  The patient should benefit from training in stress management and relaxation.      Screening Interventions   Interventions  Encouraged to exercise       Quality of Life Scores:   PHQ-9: Recent Review Flowsheet Data    [facility] 05/11[facility]2017 06/20/2016 04/12/2014   Decreased Interest 3 0 0   Down, Depressed, Hopeless 2 0 0   PHQ - 2 Score 5 0 0   Altered sleeping 3 - -   Tired, decreased energy 3 - -   Change in appetite 3 - -   Feeling bad or failure about yourself  0 - -   Trouble concentrating 0 - -   Moving slowly or fidgety/restless 0 - -   Suicidal thoughts 0 - -   PHQ-9 Score 14 - -   Difficult doing work/chores Somewhat difficult - -     Interpretation of Total Score  Total Score Depression Severity:  1-4 = Minimal depression, 5-9 = Mild depression, 10-14 = Moderate depression, 15-19 = Moderately severe depression, 20-27 = Severe depression   Psychosocial Evaluation and Intervention: Psychosocial Evaluation - 05/11/17 1116      Psychosocial Evaluation & Interventions  Interventions  Physician referral;Therapist referral;Encouraged to exercise with the program and follow exercise prescription    Continue Psychosocial Services   Follow up required by counselor       Psychosocial Re-Evaluation: Psychosocial Re-Evaluation    [place] Name 07/01/17 2116 07/24/17 1350 08/24/17 0753 09/14/17 1417       Psychosocial Re-Evaluation   Current issues with  Current Depression;History of Depression  Current Depression;History of Depression  Current Depression;History of Depression  Current Depression;History of Depression    Comments  patient follows up with therapist  patient follows up with therapist  patient follows up with therapist. no psychosocial barriers to participation identified over the past 30 days  patient follows up with therapist. no psychosocial barriers to participation identified  over the past 30 days, she feels her depression has improved while in the program.    Expected Outcomes  patient will remain free from psychosocial barriers to participation in pulmonary rehab  patient will remain free from psychosocial barriers to participation in pulmonary rehab  patient will remain free from psychosocial barriers to participation in pulmonary rehab  patient will remain free from psychosocial barriers to participation in pulmonary rehab    Interventions  Encouraged to attend Pulmonary Rehabilitation for the exercise  Encouraged to attend Pulmonary Rehabilitation for the exercise  Encouraged to attend Pulmonary Rehabilitation for the exercise  Encouraged to attend Pulmonary Rehabilitation for the exercise    Continue Psychosocial Services   No Follow up required  No Follow up required  No Follow up required  No Follow up required       Psychosocial Discharge (Final Psychosocial Re-Evaluation): Psychosocial Re-Evaluation - 09/14/17 1417      Psychosocial Re-Evaluation   Current issues with  Current Depression;History of Depression    Comments  patient follows up with therapist. no psychosocial barriers to participation identified over the past 30 days, she feels her depression has improved while in the program.    Expected Outcomes  patient will remain free from psychosocial barriers to participation in pulmonary rehab    Interventions  Encouraged to attend Pulmonary Rehabilitation for the exercise    Continue Psychosocial Services   No Follow up required       Education: Education Goals: Education classes will be provided on a weekly basis, covering required topics. Participant will state understanding/return demonstration of topics presented.  Learning Barriers/Preferences: Learning Barriers/Preferences - 05/11/17 1102      Learning Barriers/Preferences   Learning Barriers  None    Learning Preferences  Video;Pictoral;Audio;Computer/Internet       Education  Topics: Risk Factor Reduction:  -Group instruction that is supported by a PowerPoint presentation. Instructor discusses the definition of a risk factor, different risk factors for pulmonary disease, and how the heart and lungs work together.     PULMONARY REHAB OTHER RESPIRATORY from 08/27/2017 in [place]  Date  05/28/17  Educator  ep  Instruction Review Code  2- meets goals/outcomes      Nutrition for Pulmonary Patient:  -Group instruction provided by PowerPoint slides, verbal discussion, and written materials to support subject matter. The instructor gives an explanation and review of healthy diet recommendations, which includes a discussion on weight management, recommendations for fruit and vegetable consumption, as well as protein, fluid, caffeine, fiber, sodium, sugar, and alcohol. Tips for eating when patients are short of breath are discussed.   PULMONARY REHAB OTHER RESPIRATORY from 08/27/2017 in [place]  Date  08/20/17  Educator  edna  Instruction Review Code  2- meets goals/outcomes      Pursed Lip Breathing:  -Group instruction that is supported by demonstration and informational handouts. Instructor discusses the benefits of pursed lip and diaphragmatic breathing and detailed demonstration on how to preform both.     Oxygen Safety:  -Group instruction provided by PowerPoint, verbal discussion, and written material to support subject matter. There is an overview of "What is Oxygen" and "Why do we need it".  Instructor also reviews how to create a safe environment for oxygen use, the importance of using oxygen as prescribed, and the risks of noncompliance. There is a brief discussion on traveling with oxygen and resources the patient may utilize.   PULMONARY REHAB OTHER RESPIRATORY from 08/27/2017 in [place]  Date  08/27/17  Educator  Truddie Crumble  Instruction Review Code  2-  meets goals/outcomes      Oxygen Equipment:  -Group instruction provided by [employer] Staff utilizing handouts, written materials, and equipment demonstrations.   Signs and Symptoms:  -Group instruction provided by written material and verbal discussion to support subject matter. Warning signs and symptoms of infection, stroke, and heart attack are reviewed and when to call the physician/911 reinforced. Tips for preventing the spread of infection discussed.   PULMONARY REHAB OTHER RESPIRATORY from 08/27/2017 in [place]  Date  07/23/17  Educator  rn  Instruction Review Code  2- meets goals/outcomes      Advanced Directives:  -Group instruction provided by verbal instruction and written material to support subject matter. Instructor reviews Advanced Directive laws and proper instruction for filling out document.   Pulmonary Video:  -Group video education that reviews the importance of medication and oxygen compliance, exercise, good nutrition, pulmonary hygiene, and pursed lip and diaphragmatic breathing for the pulmonary patient.   PULMONARY REHAB OTHER RESPIRATORY from 08/27/2017 in [place]  Date  07/30/17  Instruction Review Code  2- meets goals/outcomes      Exercise for the Pulmonary Patient:  -Group instruction that is supported by a PowerPoint presentation. Instructor discusses benefits of exercise, core components of exercise, frequency, duration, and intensity of an exercise routine, importance of utilizing pulse oximetry during exercise, safety while exercising, and options of places to exercise outside of rehab.     Pulmonary Medications:  -Verbally interactive group education provided by instructor with focus on inhaled medications and proper administration.   PULMONARY REHAB OTHER RESPIRATORY from 08/27/2017 in [place]  Date  07/02/17  Educator  pharmacist   Instruction Review Code  2- meets goals/outcomes      Anatomy and Physiology of the Respiratory System and Intimacy:  -Group instruction provided by PowerPoint, verbal discussion, and written material to support subject matter. Instructor reviews respiratory cycle and anatomical components of the respiratory system and their functions. Instructor also reviews differences in obstructive and restrictive respiratory diseases with examples of each. Intimacy, Sex, and Sexuality differences are reviewed with a discussion on how relationships can change when diagnosed with pulmonary disease. Common sexual concerns are reviewed.   MD DAY -A group question and answer session with a medical doctor that allows participants to ask questions that relate to their pulmonary disease state.   OTHER EDUCATION -Group or individual verbal, written, or video instructions that support the educational goals of the pulmonary rehab program.   Knowledge Questionnaire Score:   Core Components/Risk Factors/Patient Goals at Admission:  Personal Goals and Risk Factors at Admission - 05/11/17 1108      Core Components/Risk Factors/Patient Goals on Admission    Weight Management  Yes    Intervention  Weight Management: Develop a combined nutrition and exercise program designed to reach desired caloric intake, while maintaining appropriate intake of nutrient and fiber, sodium and fats, and appropriate energy expenditure required for the weight goal.    Admit Weight  213 lb 3 oz (96.7 kg)    Goal Weight: Short Term  202 lb 13.2 oz (92 kg)    Goal Weight: Long Term  203 lb (92.1 kg)    Expected Outcomes  Short Term: Continue to assess and modify interventions until short term weight is achieved    Improve shortness of breath with ADL's  Yes    Intervention  Provide education, individualized exercise plan and daily activity instruction to help decrease symptoms of SOB with activities of daily living.    Expected  Outcomes  Short Term: Achieves a reduction of symptoms when performing activities of daily living.    Develop more efficient breathing techniques such as purse lipped breathing and diaphragmatic breathing; and practicing self-pacing with activity  Yes    Intervention  Provide education, demonstration and support about specific breathing techniuqes utilized for more efficient breathing. Include techniques such as pursed lipped breathing, diaphragmatic breathing and self-pacing activity.    Expected Outcomes  Short Term: Participant will be able to demonstrate and use breathing techniques as needed throughout daily activities.    Stress  Yes    Intervention  Offer individual and/or small group education and counseling on adjustment to heart disease, stress management and health-related lifestyle change. Teach and support self-help strategies.    Expected Outcomes  Short Term: Participant demonstrates changes in health-related behavior, relaxation and other stress management skills, ability to obtain effective social support, and compliance with psychotropic medications if prescribed.       Core Components/Risk Factors/Patient Goals Review:  Goals and Risk Factor Review    Row Name 06/02/17 1718 07/01/17 2115 07/24/17 1349 08/24/17 0751 09/14/17 1413     Core Components/Risk Factors/Patient Goals Review   Personal Goals Review  -  Weight Management/Obesity;Improve shortness of breath with ADL's;Develop more efficient breathing techniques such as purse lipped breathing and diaphragmatic breathing and practicing self-pacing with activity.  Weight Management/Obesity;Improve shortness of breath with ADL's;Develop more efficient breathing techniques such as purse lipped breathing and diaphragmatic breathing and practicing self-pacing with activity.  Weight Management/Obesity;Improve shortness of breath with ADL's;Develop more efficient breathing techniques such as purse lipped breathing and diaphragmatic  breathing and practicing self-pacing with activity.  Weight Management/Obesity;Improve shortness of breath with ADL's   Review  patient has only attended 2 sessions since admission and too soon to evaluate progress towards goals  patient has mastered the purse lip breathing technique and is often observed using the technique to reduce her shortness of breath. she is not loosing weight but admits to not changing her caloric intake.  patient has mastered the purse lip breathing technique and is often observed using the technique to reduce her shortness of breath. she is not loosing weight but admits to not changing her caloric intake.  patient continues to work hard in pulmonary rehab. she uses PLB without cueing. He weight remains relatively stable however we celebrate any success even if it is only weight loss by a tenth.  She has increased her workloads on all the equipment, works hard, her wieght continues to stay  relatively stable, she loses 1-2 kg, but then it returns easily.  She has been absent 1 week due to an illness.   Expected Outcomes  see admission expected outcomes  see admission expected outcomes  see admission expected outcomes  see admission expected outcomes  see admission expected outcomes      Core Components/Risk Factors/Patient Goals at Discharge (Final Review):  Goals and Risk Factor Review - 09/14/17 1413      Core Components/Risk Factors/Patient Goals Review   Personal Goals Review  Weight Management/Obesity;Improve shortness of breath with ADL's    Review  She has increased her workloads on all the equipment, works hard, her wieght continues to stay relatively stable, she loses 1-2 kg, but then it returns easily.  She has been absent 1 week due to an illness.    Expected Outcomes  see admission expected outcomes       ITP Comments:   Comments: ITP REVIEW Pt is making expected progress toward pulmonary rehab goals after completing 20 sessions. Recommend continued exercise,  life style modification, education, and utilization of breathing techniques to increase stamina and strength and decrease shortness of breath with exertion.

## 2017-09-18 ENCOUNTER — Other Ambulatory Visit: Payer: Self-pay | Admitting: Internal Medicine

## 2017-09-18 ENCOUNTER — Other Ambulatory Visit: Payer: Self-pay | Admitting: Cardiology

## 2017-09-18 DIAGNOSIS — E032 Hypothyroidism due to medicaments and other exogenous substances: Secondary | ICD-10-CM

## 2017-09-18 DIAGNOSIS — R7989 Other specified abnormal findings of blood chemistry: Secondary | ICD-10-CM

## 2017-09-22 ENCOUNTER — Encounter (HOSPITAL_COMMUNITY)
Admission: RE | Admit: 2017-09-22 | Discharge: 2017-09-22 | Disposition: A | Discharge status: Home / Self Care | Payer: Medicare Other | Source: Ambulatory Visit | Attending: Internal Medicine | Admitting: Internal Medicine

## 2017-09-22 VITALS — Wt 205.9 lb

## 2017-09-22 DIAGNOSIS — Z7951 Long term (current) use of inhaled steroids: Secondary | ICD-10-CM | POA: Diagnosis not present

## 2017-09-22 DIAGNOSIS — E039 Hypothyroidism, unspecified: Secondary | ICD-10-CM | POA: Diagnosis not present

## 2017-09-22 DIAGNOSIS — J8410 Pulmonary fibrosis, unspecified: Secondary | ICD-10-CM

## 2017-09-22 DIAGNOSIS — Z87891 Personal history of nicotine dependence: Secondary | ICD-10-CM | POA: Diagnosis not present

## 2017-09-22 DIAGNOSIS — Z79899 Other long term (current) drug therapy: Secondary | ICD-10-CM | POA: Diagnosis not present

## 2017-09-22 DIAGNOSIS — M1711 Unilateral primary osteoarthritis, right knee: Secondary | ICD-10-CM | POA: Diagnosis not present

## 2017-09-22 DIAGNOSIS — I10 Essential (primary) hypertension: Secondary | ICD-10-CM | POA: Diagnosis not present

## 2017-09-22 DIAGNOSIS — L4050 Arthropathic psoriasis, unspecified: Secondary | ICD-10-CM | POA: Diagnosis not present

## 2017-09-22 DIAGNOSIS — Z7982 Long term (current) use of aspirin: Secondary | ICD-10-CM | POA: Diagnosis not present

## 2017-09-22 DIAGNOSIS — J9610 Chronic respiratory failure, unspecified whether with hypoxia or hypercapnia: Secondary | ICD-10-CM | POA: Diagnosis not present

## 2017-09-22 DIAGNOSIS — K219 Gastro-esophageal reflux disease without esophagitis: Secondary | ICD-10-CM | POA: Diagnosis not present

## 2017-09-22 DIAGNOSIS — I252 Old myocardial infarction: Secondary | ICD-10-CM | POA: Diagnosis not present

## 2017-09-22 DIAGNOSIS — J449 Chronic obstructive pulmonary disease, unspecified: Secondary | ICD-10-CM | POA: Diagnosis not present

## 2017-09-22 NOTE — Progress Notes (Signed)
Daily Session Note  Patient Details  Name: [name] MRN: [id number] Date of Birth: 1945/12/30 Referring Provider:     Pulmonary Rehab Walk Test from 05/21/2017 in [place]  Referring Provider  Dr. Melvyn Novas      Encounter Date: 09/22/2017  Check In: Session Check In - 09/22/17 1330      Check-In   Location  [facility]    Staff Present  Su Hilt, MS, ACSM RCEP, Exercise Physiologist;Joan Leonia Reeves, RN, Luisa Hart, RN, Roque Cash, RN    Supervising physician immediately available to respond to emergencies  [facility] immediately available    Physician(s)  Dr. Maylene Roes    Medication changes reported      No    Fall or balance concerns reported     No    Tobacco Cessation  No Change    Warm-up and Cool-down  Performed as group-led instruction    Resistance Training Performed  Yes    VAD Patient?  No      Pain Assessment   Currently in Pain?  No/denies    Multiple Pain Sites  No       Capillary Blood Glucose: No results found for this or any previous visit (from the past 24 hour(s)).  Exercise Prescription Changes - 09/22/17 1500      Response to Exercise   Blood Pressure (Admit)  122/64    Blood Pressure (Exercise)  170/82    Blood Pressure (Exit)  130/56    Heart Rate (Admit)  96 bpm    Heart Rate (Exercise)  129 bpm    Heart Rate (Exit)  99 bpm    Oxygen Saturation (Admit)  96 %    Oxygen Saturation (Exercise)  90 %    Oxygen Saturation (Exit)  95 %    Rating of Perceived Exertion (Exercise)  13    Perceived Dyspnea (Exercise)  3    Duration  Continue with 45 min of aerobic exercise without signs/symptoms of physical distress.    Intensity  THRR unchanged      Progression   Progression  Continue to progress workloads to maintain intensity without signs/symptoms of physical distress.      Resistance Training   Training Prescription  Yes    Weight  blue bands    Reps  10-15    Time  10  Minutes      Oxygen   Oxygen  Continuous    Liters  4      NuStep   Level  6    Minutes  17    METs  2.8      Arm Ergometer   Level  7    Minutes  17      Track   Laps  7    Minutes  17       Social History   Tobacco Use  Smoking Status Former Smoker  . Packs/day: 1.50  . Years: 58.50  . Pack years: 87.75  . Types: Cigarettes  . Last attempt to quit: 03/03/2005  . Years since quitting: 12.5  Smokeless Tobacco Never Used    Goals Met:  Exercise tolerated well No report of cardiac concerns or symptoms Strength training completed today  Goals Unmet:  Not Applicable  Comments: Service time is from 1330 to 1500    Dr. Rush Farmer is Medical Director for Pulmonary Rehab at [facility]

## 2017-09-29 ENCOUNTER — Encounter (HOSPITAL_COMMUNITY)
Admission: RE | Admit: 2017-09-29 | Discharge: 2017-09-29 | Disposition: A | Discharge status: Home / Self Care | Payer: Medicare Other | Source: Ambulatory Visit | Attending: Internal Medicine | Admitting: Internal Medicine

## 2017-09-29 VITALS — Wt 205.2 lb

## 2017-09-29 DIAGNOSIS — J8410 Pulmonary fibrosis, unspecified: Secondary | ICD-10-CM | POA: Diagnosis not present

## 2017-09-29 DIAGNOSIS — Z79899 Other long term (current) drug therapy: Secondary | ICD-10-CM | POA: Diagnosis not present

## 2017-09-29 DIAGNOSIS — Z7982 Long term (current) use of aspirin: Secondary | ICD-10-CM | POA: Diagnosis not present

## 2017-09-29 DIAGNOSIS — J449 Chronic obstructive pulmonary disease, unspecified: Secondary | ICD-10-CM | POA: Diagnosis not present

## 2017-09-29 DIAGNOSIS — Z7951 Long term (current) use of inhaled steroids: Secondary | ICD-10-CM | POA: Diagnosis not present

## 2017-09-29 DIAGNOSIS — J9610 Chronic respiratory failure, unspecified whether with hypoxia or hypercapnia: Secondary | ICD-10-CM | POA: Diagnosis not present

## 2017-09-29 NOTE — Progress Notes (Signed)
Daily Session Note  Patient Details  Name: [name] MRN: [id number] Date of Birth: Jul 18, 1946 Referring Provider:     Pulmonary Rehab Walk Test from 05/21/2017 in [place]  Referring Provider  Dr. Melvyn Novas      Encounter Date: 09/29/2017  Check In: Session Check In - 09/29/17 1330      Check-In   Location  [facility]    Staff Present  Su Hilt, MS, ACSM RCEP, Exercise Physiologist;Joan Leonia Reeves, RN, Luisa Hart, RN, Roque Cash, RN    Supervising physician immediately available to respond to emergencies  [facility] immediately available    Physician(s)  Dr. Maylene Roes    Medication changes reported      No    Fall or balance concerns reported     No    Tobacco Cessation  No Change    Warm-up and Cool-down  Performed as group-led instruction    Resistance Training Performed  Yes    VAD Patient?  No      Pain Assessment   Currently in Pain?  No/denies    Multiple Pain Sites  No       Capillary Blood Glucose: No results found for this or any previous visit (from the past 24 hour(s)).  Exercise Prescription Changes - 09/29/17 1500      Response to Exercise   Blood Pressure (Admit)  156/70    Blood Pressure (Exercise)  138/60    Blood Pressure (Exit)  120/78    Heart Rate (Admit)  94 bpm    Heart Rate (Exercise)  125 bpm    Heart Rate (Exit)  100 bpm    Oxygen Saturation (Admit)  94 %    Oxygen Saturation (Exercise)  89 %    Oxygen Saturation (Exit)  95 %    Rating of Perceived Exertion (Exercise)  15    Perceived Dyspnea (Exercise)  3    Duration  Continue with 45 min of aerobic exercise without signs/symptoms of physical distress.    Intensity  THRR unchanged      Progression   Progression  Continue to progress workloads to maintain intensity without signs/symptoms of physical distress.      Resistance Training   Training Prescription  Yes    Weight  blue bands    Reps  10-15    Time  10  Minutes      Oxygen   Oxygen  Continuous    Liters  4      NuStep   Level  6    Minutes  17      Arm Ergometer   Level  7    Minutes  17      Track   Laps  14    Minutes  17       Social History   Tobacco Use  Smoking Status Former Smoker  . Packs/day: 1.50  . Years: 58.50  . Pack years: 87.75  . Types: Cigarettes  . Last attempt to quit: 03/03/2005  . Years since quitting: 12.5  Smokeless Tobacco Never Used    Goals Met:  Independence with exercise equipment Improved SOB with ADL's Using PLB without cueing & demonstrates good technique Exercise tolerated well No report of cardiac concerns or symptoms Strength training completed today  Goals Unmet:  Not Applicable  Comments: Service time is from 1330 to 1515   Dr. Rush Farmer is Medical Director for Pulmonary Rehab at [facility].

## 2017-10-01 ENCOUNTER — Encounter (HOSPITAL_COMMUNITY)
Admission: RE | Admit: 2017-10-01 | Discharge: 2017-10-01 | Disposition: A | Discharge status: Home / Self Care | Payer: Medicare Other | Source: Ambulatory Visit | Attending: Internal Medicine | Admitting: Internal Medicine

## 2017-10-01 VITALS — Wt 204.4 lb

## 2017-10-01 DIAGNOSIS — J8410 Pulmonary fibrosis, unspecified: Secondary | ICD-10-CM

## 2017-10-01 DIAGNOSIS — M0589 Other rheumatoid arthritis with rheumatoid factor of multiple sites: Secondary | ICD-10-CM | POA: Diagnosis not present

## 2017-10-01 DIAGNOSIS — J449 Chronic obstructive pulmonary disease, unspecified: Secondary | ICD-10-CM | POA: Diagnosis not present

## 2017-10-01 DIAGNOSIS — Z7951 Long term (current) use of inhaled steroids: Secondary | ICD-10-CM | POA: Diagnosis not present

## 2017-10-01 DIAGNOSIS — Z79899 Other long term (current) drug therapy: Secondary | ICD-10-CM | POA: Diagnosis not present

## 2017-10-01 DIAGNOSIS — J9610 Chronic respiratory failure, unspecified whether with hypoxia or hypercapnia: Secondary | ICD-10-CM | POA: Diagnosis not present

## 2017-10-01 DIAGNOSIS — Z7982 Long term (current) use of aspirin: Secondary | ICD-10-CM | POA: Diagnosis not present

## 2017-10-01 NOTE — Progress Notes (Signed)
Daily Session Note  Patient Details  Name: [name] MRN: [id number] Date of Birth: April 20, 1946 Referring Provider:     Pulmonary Rehab Walk Test from 05/21/2017 in [place]  Referring Provider  Dr. Melvyn Novas      Encounter Date: 10/01/2017  Check In: Session Check In - 10/01/17 1330      Check-In   Location  [facility]    Staff Present  Rosebud Poles, RN, BSN;Gasper Hopes, MS, ACSM RCEP, Exercise Physiologist;Lisa Ysidro Evert, RN;Portia Rollene Rotunda, RN, BSN    Supervising physician immediately available to respond to emergencies  [facility] immediately available    Physician(s)  Dr. Nevada Crane    Medication changes reported      No    Fall or balance concerns reported     No    Tobacco Cessation  No Change    Warm-up and Cool-down  Performed as group-led instruction    Resistance Training Performed  Yes    VAD Patient?  No      Pain Assessment   Currently in Pain?  No/denies    Multiple Pain Sites  No       Capillary Blood Glucose: No results found for this or any previous visit (from the past 24 hour(s)).  Exercise Prescription Changes - 10/01/17 1600      Response to Exercise   Blood Pressure (Admit)  138/72    Blood Pressure (Exercise)  180/80    Blood Pressure (Exit)  114/64    Heart Rate (Admit)  100 bpm    Heart Rate (Exercise)  131 bpm    Heart Rate (Exit)  105 bpm    Oxygen Saturation (Admit)  96 %    Oxygen Saturation (Exercise)  92 %    Oxygen Saturation (Exit)  96 %    Rating of Perceived Exertion (Exercise)  13    Perceived Dyspnea (Exercise)  1    Duration  Continue with 45 min of aerobic exercise without signs/symptoms of physical distress.    Intensity  THRR unchanged      Progression   Progression  Continue to progress workloads to maintain intensity without signs/symptoms of physical distress.      Resistance Training   Training Prescription  Yes    Weight  blue bands    Reps  10-15    Time  10  Minutes      Oxygen   Oxygen  Continuous    Liters  4      Arm Ergometer   Level  7    Minutes  17      Track   Laps  11    Minutes  17       Social History   Tobacco Use  Smoking Status Former Smoker  . Packs/day: 1.50  . Years: 58.50  . Pack years: 87.75  . Types: Cigarettes  . Last attempt to quit: 03/03/2005  . Years since quitting: 12.5  Smokeless Tobacco Never Used    Goals Met:  Exercise tolerated well No report of cardiac concerns or symptoms Strength training completed today  Goals Unmet:  Not Applicable  Comments: Service time is from 1:30p to 3:30p    Dr. Rush Farmer is Medical Director for Pulmonary Rehab at [facility].

## 2017-10-06 ENCOUNTER — Encounter: Payer: Self-pay | Admitting: Internal Medicine

## 2017-10-06 ENCOUNTER — Encounter (HOSPITAL_COMMUNITY)
Admission: RE | Admit: 2017-10-06 | Discharge: 2017-10-06 | Disposition: A | Discharge status: Home / Self Care | Payer: Medicare Other | Source: Ambulatory Visit | Attending: Internal Medicine | Admitting: Internal Medicine

## 2017-10-06 ENCOUNTER — Ambulatory Visit (INDEPENDENT_AMBULATORY_CARE_PROVIDER_SITE_OTHER): Payer: Medicare Other | Admitting: Internal Medicine

## 2017-10-06 VITALS — BP 130/72 | HR 92 | Temp 97.2°F | Wt 202.0 lb

## 2017-10-06 VITALS — Wt 205.0 lb

## 2017-10-06 DIAGNOSIS — I252 Old myocardial infarction: Secondary | ICD-10-CM | POA: Diagnosis not present

## 2017-10-06 DIAGNOSIS — K219 Gastro-esophageal reflux disease without esophagitis: Secondary | ICD-10-CM | POA: Diagnosis not present

## 2017-10-06 DIAGNOSIS — J8410 Pulmonary fibrosis, unspecified: Secondary | ICD-10-CM

## 2017-10-06 DIAGNOSIS — I10 Essential (primary) hypertension: Secondary | ICD-10-CM | POA: Diagnosis not present

## 2017-10-06 DIAGNOSIS — J449 Chronic obstructive pulmonary disease, unspecified: Secondary | ICD-10-CM | POA: Insufficient documentation

## 2017-10-06 DIAGNOSIS — J9610 Chronic respiratory failure, unspecified whether with hypoxia or hypercapnia: Secondary | ICD-10-CM | POA: Diagnosis not present

## 2017-10-06 DIAGNOSIS — Z7951 Long term (current) use of inhaled steroids: Secondary | ICD-10-CM | POA: Insufficient documentation

## 2017-10-06 DIAGNOSIS — J9612 Chronic respiratory failure with hypercapnia: Secondary | ICD-10-CM | POA: Diagnosis not present

## 2017-10-06 DIAGNOSIS — M1711 Unilateral primary osteoarthritis, right knee: Secondary | ICD-10-CM | POA: Insufficient documentation

## 2017-10-06 DIAGNOSIS — R51 Headache: Secondary | ICD-10-CM | POA: Diagnosis not present

## 2017-10-06 DIAGNOSIS — E039 Hypothyroidism, unspecified: Secondary | ICD-10-CM | POA: Insufficient documentation

## 2017-10-06 DIAGNOSIS — Z79899 Other long term (current) drug therapy: Secondary | ICD-10-CM | POA: Diagnosis not present

## 2017-10-06 DIAGNOSIS — G4486 Cervicogenic headache: Secondary | ICD-10-CM

## 2017-10-06 DIAGNOSIS — L4050 Arthropathic psoriasis, unspecified: Secondary | ICD-10-CM | POA: Diagnosis not present

## 2017-10-06 DIAGNOSIS — Z7982 Long term (current) use of aspirin: Secondary | ICD-10-CM | POA: Insufficient documentation

## 2017-10-06 DIAGNOSIS — J9611 Chronic respiratory failure with hypoxia: Secondary | ICD-10-CM | POA: Diagnosis not present

## 2017-10-06 DIAGNOSIS — Z87891 Personal history of nicotine dependence: Secondary | ICD-10-CM | POA: Insufficient documentation

## 2017-10-06 NOTE — Patient Instructions (Signed)
Call or return to clinic prn if these symptoms worsen or fail to improve as anticipated.

## 2017-10-06 NOTE — Progress Notes (Signed)
Daily Session Note  Patient Details  Name: [name] MRN: [id number] Date of Birth: April 15, 1946 Referring Provider:     Pulmonary Rehab Walk Test from 05/21/2017 in [place]  Referring Provider  Dr. Melvyn Novas      Encounter Date: 10/06/2017  Check In: Session Check In - 10/06/17 1331      Check-In   Location  [facility]    Staff Present  Su Hilt, MS, ACSM RCEP, Exercise Physiologist;Lisa Ysidro Evert, RN;Mamoru Takeshita Cumberland, RN, Maxcine Ham, RN, BSN    Supervising physician immediately available to respond to emergencies  [facility] immediately available    Physician(s)  Dr. Nevada Crane    Medication changes reported      No    Fall or balance concerns reported     No    Tobacco Cessation  No Change    Warm-up and Cool-down  Performed as group-led instruction    Resistance Training Performed  Yes    VAD Patient?  No      Pain Assessment   Currently in Pain?  No/denies    Multiple Pain Sites  No       Capillary Blood Glucose: No results found for this or any previous visit (from the past 24 hour(s)).  Exercise Prescription Changes - 10/06/17 1533      Response to Exercise   Blood Pressure (Admit)  134/54    Blood Pressure (Exercise)  140/80    Blood Pressure (Exit)  122/68    Heart Rate (Admit)  101 bpm    Heart Rate (Exercise)  118 bpm    Heart Rate (Exit)  99 bpm    Oxygen Saturation (Admit)  96 %    Oxygen Saturation (Exercise)  92 %    Oxygen Saturation (Exit)  99 %    Rating of Perceived Exertion (Exercise)  13    Perceived Dyspnea (Exercise)  3    Duration  Continue with 45 min of aerobic exercise without signs/symptoms of physical distress.    Intensity  THRR unchanged      Progression   Progression  Continue to progress workloads to maintain intensity without signs/symptoms of physical distress.      Resistance Training   Training Prescription  Yes    Weight  blue bands    Reps  10-15    Time  10  Minutes      Oxygen   Oxygen  Continuous    Liters  4      NuStep   Level  6    Minutes  17    METs  2.6      Arm Ergometer   Level  7    Minutes  17      Track   Laps  9    Minutes  17       Social History   Tobacco Use  Smoking Status Former Smoker  . Packs/day: 1.50  . Years: 58.50  . Pack years: 87.75  . Types: Cigarettes  . Last attempt to quit: 03/03/2005  . Years since quitting: 12.6  Smokeless Tobacco Never Used    Goals Met:  Independence with exercise equipment Improved SOB with ADL's Using PLB without cueing & demonstrates good technique Exercise tolerated well No report of cardiac concerns or symptoms Strength training completed today  Goals Unmet:  Not Applicable  Comments: Service time is from 1330 to 1510   Dr. Rush Farmer is Medical Director for Pulmonary  Rehab at [facility].

## 2017-10-06 NOTE — Progress Notes (Signed)
Subjective:    Patient ID: [name], female    DOB: 03/26/1946, 71 y.o.   MRN: [id number]  HPI  71 year old patient who is chronic medical illnesses include hypertension and chronic respiratory failure.  She is followed by pulmonary medicine and pulmonary rehab.  For the past 2 or 3 months she has had occasional occipital headaches associated with neck pain.  Pain is aggravated by movement and alleviated by gentle massage therapy. She has recently had a friend who died suddenly of a cerebrovascular accident and she was very much concerned about headaches and stroke risk  Past Medical History:  Diagnosis Date  . Arthritis    OA RIGHT KNEE WITH PAIN  . Barrett esophagus   . Chronic respiratory failure (HCC)   . COPD (chronic obstructive pulmonary disease) (HCC)   . GERD (gastroesophageal reflux disease)   . History of ARDS 2006  . History of home oxygen therapy    AT NIGHT WHEN SLEEPING 2 L / MIN NASAL CANNULA  . Hypertension   . Hypothyroidism   . NSTEMI (non-ST elevated myocardial infarction) (HCC) 05/31/2015  . Pneumococcal pneumonia (HCC) 2006   HOSPITALIZED AND DEVELOPED ARDS  . Psoriatic arthritis (HCC)   . Pulmonary fibrosis (HCC)   . Rheumatic disease   . SOB (shortness of breath) on exertion      Social History   Socioeconomic History  . Marital status: Married    Spouse name: Not on file  . Number of children: Not on file  . Years of education: Not on file  . Highest education level: Not on file  Social Needs  . Financial resource strain: Not on file  . Food insecurity - worry: Not on file  . Food insecurity - inability: Not on file  . Transportation needs - medical: Not on file  . Transportation needs - non-medical: Not on file  Occupational History  . Occupation: Nurse, children's: [employer]  Tobacco Use  . Smoking status: Former Smoker    Packs/day: 1.50    Years: 58.50    Pack years: 87.75    Types: Cigarettes    Last attempt to  quit: 03/03/2005    Years since quitting: 12.6  . Smokeless tobacco: Never Used  Substance and Sexual Activity  . Alcohol use: No  . Drug use: No  . Sexual activity: Not on file  Other Topics Concern  . Not on file  Social History Narrative  . Not on file    Past Surgical History:  Procedure Laterality Date  . ABDOMINOPLASTY    . CARDIAC CATHETERIZATION N/A 06/01/2015   Procedure: Left Heart Cath and Coronary Angiography;  Surgeon: Runell Gess, MD;  Location: [facility] INVASIVE CV LAB;  Service: Cardiovascular;  Laterality: N/A;  . CARPAL TUNNEL RELEASE    . CHOLECYSTECTOMY    . CORONARY ARTERY BYPASS GRAFT N/A 06/04/2015   Procedure: CORONARY ARTERY BYPASS GRAFT times three            with left internal mammary artery and right leg saphenous vein;  Surgeon: Alleen Borne, MD;  Location: [facility] OR;  Service: Open Heart Surgery;  Laterality: N/A;  . cosmetic breast surgery    . JOINT REPLACEMENT    . KNEE ARTHROSCOPY Left   . TEE WITHOUT CARDIOVERSION  06/04/2015   Procedure: TRANSESOPHAGEAL ECHOCARDIOGRAM (TEE);  Surgeon: Alleen Borne, MD;  Location: [facility];  Service: Open Heart Surgery;;  . TOTAL KNEE ARTHROPLASTY Left   .  TOTAL KNEE ARTHROPLASTY Right 06/06/2013   Procedure: RIGHT TOTAL KNEE ARTHROPLASTY;  Surgeon: Loanne Drilling, MD;  Location: [facility] ORS;  Service: Orthopedics;  Laterality: Right;    Family History  Problem Relation Age of Onset  . Breast cancer Mother   . Coronary artery disease Father   . Rheum arthritis Father     No Known Allergies  Current Outpatient Medications on File Prior to Visit  Medication Sig Dispense Refill  . acetaminophen (TYLENOL ARTHRITIS PAIN) 650 MG CR tablet Take 1,300 mg by mouth every 8 (eight) hours as needed for pain. Per bottle directions as needed    . albuterol (PROAIR HFA) 108 (90 Base) MCG/ACT inhaler 2 puffs every 4 hours as needed only  if your can't catch your breath 1 Inhaler 11  . aspirin EC 325 MG EC tablet Take 1 tablet (325 mg  total) by mouth daily. 30 tablet 0  . atorvastatin (LIPITOR) 80 MG tablet TAKE 1 TABLET BY MOUTH  DAILY AT 6 PM. 90 tablet 1  . budesonide-formoterol (SYMBICORT) 160-4.5 MCG/ACT inhaler Inhale 2 puffs into the lungs 2 (two) times daily. 3 Inhaler 6  . Certolizumab Pegol (CIMZIA [place]) Inject into the skin as directed. Cimzia, Injected by provider as directed    . Cholecalciferol (VITAMIN D3) 2000 UNITS TABS Take 2,000 Int'l Units by mouth daily.    . citalopram (CELEXA) 40 MG tablet TAKE 1 TABLET BY MOUTH  EVERY MORNING 90 tablet 0  . famotidine (PEPCID) 20 MG tablet One at bedtime    . furosemide (LASIX) 20 MG tablet Take 1 tablet (20 mg total) by mouth 2 (two) times daily. 180 tablet 3  . levothyroxine (SYNTHROID, LEVOTHROID) 150 MCG tablet TAKE 1 TABLET BY MOUTH  DAILY BEFORE BREAKFAST 90 tablet 3  . losartan (COZAAR) 50 MG tablet TAKE 1 TABLET BY MOUTH  DAILY 90 tablet 3  . methylPREDNISolone (MEDROL DOSEPAK) 4 MG TBPK tablet follow package directions 21 tablet 0  . montelukast (SINGULAIR) 10 MG tablet TAKE 1 TABLET BY MOUTH AT  BEDTIME 90 tablet 3  . NON FORMULARY 2lpm with sleep and with exertion if needed    . pantoprazole (PROTONIX) 40 MG tablet TAKE 1 TABLET BY MOUTH  DAILY 30 TO 60 MINUTES  BEFORE FIRST MEAL OF THE  DAY 90 tablet 2  . traMADol (ULTRAM) 50 MG tablet TAKE 1-2 TABLETS EVERY 6 HOURS AS NEEDED FOR PAIN 90 tablet 0  . triamcinolone cream (KENALOG) 0.1 % Apply 1 application topically 2 (two) times daily as needed (for psorisis).     No current facility-administered medications on file prior to visit.     BP 130/72 (BP Location: Left Arm, Patient Position: Sitting, Cuff Size: Large)   Pulse 92   Temp (!) 97.2 F (36.2 C) (Oral)   Wt 202 lb (91.6 kg)   SpO2 94%   BMI 40.80 kg/m     Review of Systems  Constitutional: Negative.   HENT: Negative for congestion, dental problem, hearing loss, rhinorrhea, sinus pressure, sore throat and tinnitus.   Eyes: Negative for pain,  discharge and visual disturbance.  Respiratory: Positive for shortness of breath. Negative for cough.   Cardiovascular: Negative for chest pain, palpitations and leg swelling.  Gastrointestinal: Negative for abdominal distention, abdominal pain, blood in stool, constipation, diarrhea, nausea and vomiting.  Genitourinary: Negative for difficulty urinating, dysuria, flank pain, frequency, hematuria, pelvic pain, urgency, vaginal bleeding, vaginal discharge and vaginal pain.  Musculoskeletal: Positive for neck pain and neck  stiffness. Negative for arthralgias, gait problem and joint swelling.  Skin: Negative for rash.  Neurological: Positive for headaches. Negative for dizziness, syncope, speech difficulty, weakness and numbness.  Hematological: Negative for adenopathy.  Psychiatric/Behavioral: Negative for agitation, behavioral problems and dysphoric mood. The patient is not nervous/anxious.        Objective:   Physical Exam  Constitutional: She is oriented to person, place, and time. She appears well-developed and well-nourished.  Overweight Blood pressure 110/70  HENT:  Head: Normocephalic.  Right Ear: External ear normal.  Left Ear: External ear normal.  Mouth/Throat: Oropharynx is clear and moist.  Eyes: Conjunctivae and EOM are normal. Pupils are equal, round, and reactive to light.  Neck: Normal range of motion. Neck supple. No thyromegaly present.  Some discomfort with range of motion of the neck which was fairly well intact.  Mild tenderness over the posterior neck musculature  Cardiovascular: Normal rate, regular rhythm, normal heart sounds and intact distal pulses.  Pulmonary/Chest: Effort normal and breath sounds normal. No respiratory distress.  Nasal cannula O2 Chronic coarse rhonchi  Abdominal: Soft. Bowel sounds are normal. She exhibits no mass. There is no tenderness.  Musculoskeletal: Normal range of motion.  Lymphadenopathy:    She has no cervical adenopathy.    Neurological: She is alert and oriented to person, place, and time.  Skin: Skin is warm and dry. No rash noted.  Psychiatric: She has a normal mood and affect. Her behavior is normal.          Assessment & Plan:  Cervicogenic headaches.  Will continue heat therapy and gentle massage.  Patient reassured.  Tramadol as needed Essential hypertension stable Chronic respiratory failure  Pulmonary follow-up as scheduled Return here as scheduled in the spring  [name]

## 2017-10-08 ENCOUNTER — Telehealth (HOSPITAL_COMMUNITY): Payer: Self-pay | Admitting: *Deleted

## 2017-10-08 ENCOUNTER — Encounter (HOSPITAL_COMMUNITY)
Admission: RE | Admit: 2017-10-08 | Discharge: 2017-10-08 | Disposition: A | Discharge status: Home / Self Care | Payer: Medicare Other | Source: Ambulatory Visit

## 2017-10-08 DIAGNOSIS — J8410 Pulmonary fibrosis, unspecified: Secondary | ICD-10-CM

## 2017-10-08 NOTE — Progress Notes (Signed)
Pulmonary Individual Treatment Plan  Patient Details  Name: [name] MRN: [id number] Date of Birth: Aug 19, 1946 Referring Provider:     Pulmonary Rehab Walk Test from 05/21/2017 in [facility]  Referring Provider  Dr. Melvyn Novas      Initial Encounter Date:    Pulmonary Rehab Walk Test from 05/21/2017 in [facility]  Date  05/22/17  Referring Provider  Dr. Melvyn Novas      Visit Diagnosis: Pulmonary fibrosis ([place])  Patient's Home Medications on Admission:   Current Outpatient Medications:  .  acetaminophen (TYLENOL ARTHRITIS PAIN) 650 MG CR tablet, Take 1,300 mg by mouth every 8 (eight) hours as needed for pain. Per bottle directions as needed, Disp: , Rfl:  .  albuterol (PROAIR HFA) 108 (90 Base) MCG/ACT inhaler, 2 puffs every 4 hours as needed only  if your can't catch your breath, Disp: 1 Inhaler, Rfl: 11 .  aspirin EC 325 MG EC tablet, Take 1 tablet (325 mg total) by mouth daily., Disp: 30 tablet, Rfl: 0 .  atorvastatin (LIPITOR) 80 MG tablet, TAKE 1 TABLET BY MOUTH  DAILY AT 6 PM., Disp: 90 tablet, Rfl: 1 .  budesonide-formoterol (SYMBICORT) 160-4.5 MCG/ACT inhaler, Inhale 2 puffs into the lungs 2 (two) times daily., Disp: 3 Inhaler, Rfl: 6 .  Certolizumab Pegol (CIMZIA [place]), Inject into the skin as directed. Cimzia, Injected by provider as directed, Disp: , Rfl:  .  Cholecalciferol (VITAMIN D3) 2000 UNITS TABS, Take 2,000 Int'l Units by mouth daily., Disp: , Rfl:  .  citalopram (CELEXA) 40 MG tablet, TAKE 1 TABLET BY MOUTH  EVERY MORNING, Disp: 90 tablet, Rfl: 0 .  famotidine (PEPCID) 20 MG tablet, One at bedtime, Disp: , Rfl:  .  furosemide (LASIX) 20 MG tablet, Take 1 tablet (20 mg total) by mouth 2 (two) times daily., Disp: 180 tablet, Rfl: 3 .  levothyroxine (SYNTHROID, LEVOTHROID) 150 MCG tablet, TAKE 1 TABLET BY MOUTH  DAILY BEFORE BREAKFAST, Disp: 90 tablet, Rfl: 3 .  losartan (COZAAR) 50 MG tablet, TAKE 1 TABLET BY MOUTH   DAILY, Disp: 90 tablet, Rfl: 3 .  methylPREDNISolone (MEDROL DOSEPAK) 4 MG TBPK tablet, follow package directions, Disp: 21 tablet, Rfl: 0 .  montelukast (SINGULAIR) 10 MG tablet, TAKE 1 TABLET BY MOUTH AT  BEDTIME, Disp: 90 tablet, Rfl: 3 .  NON FORMULARY, 2lpm with sleep and with exertion if needed, Disp: , Rfl:  .  pantoprazole (PROTONIX) 40 MG tablet, TAKE 1 TABLET BY MOUTH  DAILY 30 TO 60 MINUTES  BEFORE FIRST MEAL OF THE  DAY, Disp: 90 tablet, Rfl: 2 .  traMADol (ULTRAM) 50 MG tablet, TAKE 1-2 TABLETS EVERY 6 HOURS AS NEEDED FOR PAIN, Disp: 90 tablet, Rfl: 0 .  triamcinolone cream (KENALOG) 0.1 %, Apply 1 application topically 2 (two) times daily as needed (for psorisis)., Disp: , Rfl:   Past Medical History: Past Medical History:  Diagnosis Date  . Arthritis    OA RIGHT KNEE WITH PAIN  . Barrett esophagus   . Chronic respiratory failure ([place])   . COPD (chronic obstructive pulmonary disease) ([place])   . GERD (gastroesophageal reflux disease)   . History of ARDS 2006  . History of home oxygen therapy    AT NIGHT WHEN SLEEPING 2 L / MIN NASAL CANNULA  . Hypertension   . Hypothyroidism   . NSTEMI (non-ST elevated myocardial infarction) ([place]) 05/31/2015  . Pneumococcal pneumonia ([place]) 2006   HOSPITALIZED AND DEVELOPED  ARDS  . Psoriatic arthritis ([place])   . Pulmonary fibrosis ([place])   . Rheumatic disease   . SOB (shortness of breath) on exertion     Tobacco Use: Social History   Tobacco Use  Smoking Status Former Smoker  . Packs/day: 1.50  . Years: 58.50  . Pack years: 87.75  . Types: Cigarettes  . Last attempt to quit: 03/03/2005  . Years since quitting: 12.6  Smokeless Tobacco Never Used    Labs: Recent Review Flowsheet Data    Labs for ITP Cardiac and Pulmonary Rehab Latest Ref Rng & Units 06/05/2015 06/05/2015 06/05/2015 07/12/2015 09/29/2016   Cholestrol <200 mg/dL - - - 117 157   LDLCALC <100 mg/dL - - - 51 61   LDLDIRECT mg/dL - - - - -   HDL >50 mg/dL - - - 51 76    Trlycerides <150 mg/dL - - - 75 101   Hemoglobin A1c 4.8 - 5.6 % - - - - -   PHART 7.350 - 7.450 7.268(L) - - - -   PCO2ART 35.0 - 45.0 mmHg 55.6(H) - - - -   HCO3 20.0 - 24.0 mEq/L 25.5(H) - - - -   TCO2 0 - 100 mmol/L 27 14 25  - -   ACIDBASEDEF 0.0 - 2.0 mmol/L 2.0 - - - -   O2SAT % 98.0 - - - -      Capillary Blood Glucose: Lab Results  Component Value Date   GLUCAP 113 (H) 06/06/2015   GLUCAP 107 (H) 06/06/2015   GLUCAP 128 (H) 06/05/2015   GLUCAP 114 (H) 06/05/2015   GLUCAP 109 (H) 06/05/2015     Pulmonary Assessment Scores: Pulmonary Assessment Scores    Row Name 05/22/17 1008         ADL UCSD   ADL Phase  Entry       mMRC Score   mMRC Score  1        Pulmonary Function Assessment: Pulmonary Function Assessment - 05/11/17 1103      Breath   Bilateral Breath Sounds  Clear;Decreased    Shortness of Breath  Yes;Limiting activity       Exercise Target Goals:    Exercise Program Goal: Individual exercise prescription set with THRR, safety & activity barriers. Participant demonstrates ability to understand and report RPE using BORG scale, to self-measure pulse accurately, and to acknowledge the importance of the exercise prescription.  Exercise Prescription Goal: Starting with aerobic activity 30 plus minutes a day, 3 days per week for initial exercise prescription. Provide home exercise prescription and guidelines that participant acknowledges understanding prior to discharge.  Activity Barriers & Risk Stratification:   6 Minute Walk: 6 Minute Walk    Row Name 05/22/17 1001 05/22/17 1007       6 Minute Walk   Phase  Initial  -    Distance  1010 feet  -    Walk Time  6 minutes  -    # of Rest Breaks  0  -    MPH  1.91  -    METS  2.45  -    RPE  17  -    Perceived Dyspnea   3  -    Symptoms  Yes (comment)  -    Comments  used wheelchair  -    Resting HR  88 bpm  -    Resting BP  139/76  -    Max Ex. HR  140 bpm  -  Max Ex. BP  197/100  RECHECK: 209/75, 188/94, after seated for 20 minutes 154/84  -      Interval HR   Baseline HR (retired)  88  -    1 Minute HR  116  -    2 Minute HR  118  -    3 Minute HR  126  -    4 Minute HR  136  -    5 Minute HR  140  -    6 Minute HR  136  -    2 Minute Post HR  121  -    Interval Heart Rate?  Yes  -      Interval Oxygen   Interval Oxygen?  Yes  -    Baseline Oxygen Saturation %  92 %  -    Resting Liters of Oxygen  2 L  -    1 Minute Oxygen Saturation %  87 %  -    1 Minute Liters of Oxygen  2 L  -    2 Minute Oxygen Saturation %  86 %  -    2 Minute Liters of Oxygen  2 L  -    3 Minute Oxygen Saturation %  86 %  -    3 Minute Liters of Oxygen  2 L  -    4 Minute Oxygen Saturation %  91 %  -    4 Minute Liters of Oxygen  2 L  3 L    5 Minute Oxygen Saturation %  93 %  -    5 Minute Liters of Oxygen  2 L  3 L    6 Minute Oxygen Saturation %  90 %  -    6 Minute Liters of Oxygen  2 L  3 L    2 Minute Post Oxygen Saturation %  94 %  -    2 Minute Post Liters of Oxygen  2 L  3 L       Oxygen Initial Assessment: Oxygen Initial Assessment - 05/22/17 1007      Initial 6 min Walk   Oxygen Used  Continuous;E-Tanks    Liters per minute  2    Resting Oxygen Saturation   92 %    Exercise Oxygen Saturation  during 6 min walk  86 % INCREASED TO 3 LITERS      Program Oxygen Prescription   Program Oxygen Prescription  Continuous;E-Tanks    Liters per minute  3       Oxygen Re-Evaluation: Oxygen Re-Evaluation    Row Name 06/02/17 1717 07/01/17 2113 07/24/17 1345 08/24/17 0742 09/14/17 1412     Program Oxygen Prescription   Program Oxygen Prescription  Continuous;E-Tanks  Continuous;E-Tanks  Continuous;E-Tanks  Continuous;E-Tanks  Continuous;E-Tanks   Liters per minute  3  3  3  3  3      Home Oxygen   Home Oxygen Device  Home Concentrator;Portable Concentrator;E-Tanks  Home Concentrator;Portable Concentrator;E-Tanks  Home Concentrator;Portable Concentrator;E-Tanks   Home Concentrator;Portable Concentrator;E-Tanks  Home Concentrator;Portable Concentrator;E-Tanks   Sleep Oxygen Prescription  Continuous  Continuous  Continuous  Continuous  Continuous   Liters per minute  2  2  2   -  2   Home Exercise Oxygen Prescription  Continuous  Continuous  Continuous  Continuous  Continuous   Liters per minute  2  3  4  4  4    Home at Rest Exercise Oxygen Prescription  None  None  None  None  None   Compliance with Home Oxygen Use  Yes  Yes  Yes  Yes  Yes     Goals/Expected Outcomes   Short Term Goals  To learn and exhibit compliance with exercise, home and travel O2 prescription;To learn and understand importance of monitoring SPO2 with pulse oximeter and demonstrate accurate use of the pulse oximeter.;To Learn and understand importance of maintaining oxygen saturations>88%;To learn and demonstrate proper purse lipped breathing techniques or other breathing techniques.;To learn and demonstrate proper use of respiratory medications  To learn and exhibit compliance with exercise, home and travel O2 prescription;To learn and understand importance of monitoring SPO2 with pulse oximeter and demonstrate accurate use of the pulse oximeter.;To learn and understand importance of maintaining oxygen saturations>88%;To learn and demonstrate proper pursed lip breathing techniques or other breathing techniques.;To learn and demonstrate proper use of respiratory medications  To learn and exhibit compliance with exercise, home and travel O2 prescription;To learn and understand importance of monitoring SPO2 with pulse oximeter and demonstrate accurate use of the pulse oximeter.;To learn and understand importance of maintaining oxygen saturations>88%;To learn and demonstrate proper pursed lip breathing techniques or other breathing techniques.;To learn and demonstrate proper use of respiratory medications  To learn and exhibit compliance with exercise, home and travel O2 prescription;To learn and  understand importance of monitoring SPO2 with pulse oximeter and demonstrate accurate use of the pulse oximeter.;To learn and understand importance of maintaining oxygen saturations>88%;To learn and demonstrate proper pursed lip breathing techniques or other breathing techniques.;To learn and demonstrate proper use of respiratory medications  To learn and exhibit compliance with exercise, home and travel O2 prescription;To learn and understand importance of monitoring SPO2 with pulse oximeter and demonstrate accurate use of the pulse oximeter.;To learn and understand importance of maintaining oxygen saturations>88%;To learn and demonstrate proper pursed lip breathing techniques or other breathing techniques.;To learn and demonstrate proper use of respiratory medications   Long  Term Goals  Exhibits compliance with exercise, home and travel O2 prescription;Verbalizes importance of monitoring SPO2 with pulse oximeter and return demonstration;Maintenance of O2 saturations>88%;Exhibits proper breathing techniques, such as purse lipped breathing or other method taught during program session;Compliance with respiratory medication  Exhibits compliance with exercise, home and travel O2 prescription;Verbalizes importance of monitoring SPO2 with pulse oximeter and return demonstration;Maintenance of O2 saturations>88%;Exhibits proper breathing techniques, such as pursed lip breathing or other method taught during program session;Compliance with respiratory medication  Exhibits compliance with exercise, home and travel O2 prescription;Verbalizes importance of monitoring SPO2 with pulse oximeter and return demonstration;Maintenance of O2 saturations>88%;Exhibits proper breathing techniques, such as pursed lip breathing or other method taught during program session;Compliance with respiratory medication  Exhibits compliance with exercise, home and travel O2 prescription;Verbalizes importance of monitoring SPO2 with pulse  oximeter and return demonstration;Maintenance of O2 saturations>88%;Exhibits proper breathing techniques, such as pursed lip breathing or other method taught during program session;Compliance with respiratory medication  Exhibits compliance with exercise, home and travel O2 prescription;Verbalizes importance of monitoring SPO2 with pulse oximeter and return demonstration;Maintenance of O2 saturations>88%;Exhibits proper breathing techniques, such as pursed lip breathing or other method taught during program session;Compliance with respiratory medication   Comments  -  goals met  patient does not have adequate oxygen for home exercise. will work with MD and oxygen supply company to obtain adequate oxygen for home exercise.  patient now has adequate home oxygen for exercise  patient now has adequate home oxygen for exercise   Row Name 10/08/17 1014  Program Oxygen Prescription   Program Oxygen Prescription  Continuous;E-Tanks       Liters per minute  4       Comments  requires 3 l with seated exercise and 4 l walking on track         Home Oxygen   Home Oxygen Device  Home Concentrator;Portable Concentrator;E-Tanks       Sleep Oxygen Prescription  Continuous       Liters per minute  2       Home Exercise Oxygen Prescription  Continuous       Liters per minute  4       Home at Rest Exercise Oxygen Prescription  Continuous       Liters per minute  2       Compliance with Home Oxygen Use  Yes         Goals/Expected Outcomes   Short Term Goals  To learn and exhibit compliance with exercise, home and travel O2 prescription       Long  Term Goals  Exhibits compliance with exercise, home and travel O2 prescription       Comments  patient now has adequate home oxygen for exercise       Goals/Expected Outcomes  continued compliance          Oxygen Discharge (Final Oxygen Re-Evaluation): Oxygen Re-Evaluation - 10/08/17 1014      Program Oxygen Prescription   Program Oxygen  Prescription  Continuous;E-Tanks    Liters per minute  4    Comments  requires 3 l with seated exercise and 4 l walking on track      Home Oxygen   Home Oxygen Device  Home Concentrator;Portable Concentrator;E-Tanks    Sleep Oxygen Prescription  Continuous    Liters per minute  2    Home Exercise Oxygen Prescription  Continuous    Liters per minute  4    Home at Rest Exercise Oxygen Prescription  Continuous    Liters per minute  2    Compliance with Home Oxygen Use  Yes      Goals/Expected Outcomes   Short Term Goals  To learn and exhibit compliance with exercise, home and travel O2 prescription    Long  Term Goals  Exhibits compliance with exercise, home and travel O2 prescription    Comments  patient now has adequate home oxygen for exercise    Goals/Expected Outcomes  continued compliance       Initial Exercise Prescription: Initial Exercise Prescription - 05/22/17 1000      Date of Initial Exercise RX and Referring Provider   Date  05/22/17    Referring Provider  Dr. Melvyn Novas      Oxygen   Oxygen  Continuous    Liters  3      NuStep   Level  2    Minutes  17    METs  1.5      Arm Ergometer   Level  2    Minutes  17      Track   Laps  10    Minutes  17      Prescription Details   Frequency (times per week)  2    Duration  Progress to 45 minutes of aerobic exercise without signs/symptoms of physical distress      Intensity   THRR 40-80% of Max Heartrate  60-120    Ratings of Perceived Exertion  11-13    Perceived Dyspnea  0-4  Progression   Progression  Continue progressive overload as per policy without signs/symptoms or physical distress.      Resistance Training   Training Prescription  Yes    Weight  blue bands    Reps  10-15       Perform Capillary Blood Glucose checks as needed.  Exercise Prescription Changes: Exercise Prescription Changes    Row Name 05/28/17 1500 06/02/17 1500 06/04/17 1500 06/09/17 1500 06/16/17 1500     Response to  Exercise   Blood Pressure (Admit)  144/64  138/70  164/70  138/60  138/70   Blood Pressure (Exercise)  160/76  168/86  150/78  140/64  168/74   Blood Pressure (Exit)  140/72  140/70  136/64  124/62  138/76   Heart Rate (Admit)  96 bpm  91 bpm  88 bpm  90 bpm  93 bpm   Heart Rate (Exercise)  116 bpm  116 bpm  121 bpm  127 bpm  127 bpm   Heart Rate (Exit)  104 bpm  98 bpm  97 bpm  99 bpm  108 bpm   Oxygen Saturation (Admit)  90 %  91 %  87 %  98 %  93 %   Oxygen Saturation (Exercise)  90 %  93 %  89 %  92 %  88 %   Oxygen Saturation (Exit)  92 %  96 %  96 %  99 %  97 %   Rating of Perceived Exertion (Exercise)  13  13  17  13  12    Perceived Dyspnea (Exercise)  2  2  3  1  2    Duration  Continue with 45 min of aerobic exercise without signs/symptoms of physical distress.  Continue with 45 min of aerobic exercise without signs/symptoms of physical distress.  Continue with 45 min of aerobic exercise without signs/symptoms of physical distress.  Continue with 45 min of aerobic exercise without signs/symptoms of physical distress.  Continue with 45 min of aerobic exercise without signs/symptoms of physical distress.   Intensity  THRR unchanged  THRR unchanged  THRR unchanged  THRR unchanged  THRR unchanged     Progression   Progression  Continue to progress workloads to maintain intensity without signs/symptoms of physical distress.  Continue to progress workloads to maintain intensity without signs/symptoms of physical distress.  Continue to progress workloads to maintain intensity without signs/symptoms of physical distress.  Continue to progress workloads to maintain intensity without signs/symptoms of physical distress.  Continue to progress workloads to maintain intensity without signs/symptoms of physical distress.     Resistance Training   Training Prescription  Yes  Yes  Yes  Yes  Yes   Weight  blue bands  blue bands  blue bands  blue bands  blue bands   Reps  10-15  10-15  10-15  10-15  10-15    Time  -  -  -  10 Minutes  10 Minutes     Oxygen   Oxygen  Continuous  Continuous  Continuous  Continuous  Continuous   Liters  3  3  3  3  3      NuStep   Level  2  3  -  4  4   Minutes  17  17  -  17  17   METs  1.7  1.9  -  2  1.6     Arm Ergometer   Level  -  2  2  3  2   Minutes  -  17  17  17  17      Track   Laps  9  7  9  9  9    Minutes  17  17  17  17  17    Row Name 06/18/17 1500 06/23/17 1500 06/25/17 1555 07/02/17 1600 07/21/17 1550     Response to Exercise   Blood Pressure (Admit)  134/54  170/74  144/64  118/70  126/62   Blood Pressure (Exercise)  146/60  148/76  160/60  180/70  160/60   Blood Pressure (Exit)  118/60  124/72  144/62  120/62  124/60   Heart Rate (Admit)  89 bpm  101 bpm  80 bpm  85 bpm  92 bpm   Heart Rate (Exercise)  128 bpm  120 bpm  109 bpm  129 bpm  124 bpm   Heart Rate (Exit)  108 bpm  99 bpm  90 bpm  95 bpm  102 bpm   Oxygen Saturation (Admit)  89 %  94 %  95 %  97 %  95 %   Oxygen Saturation (Exercise)  88 %  91 %  93 %  95 %  85 %   Oxygen Saturation (Exit)  94 %  98 %  97 %  99 %  95 %   Rating of Perceived Exertion (Exercise)  12  13  13  11  17    Perceived Dyspnea (Exercise)  1  1  2  1  4    Duration  Continue with 45 min of aerobic exercise without signs/symptoms of physical distress.  Continue with 45 min of aerobic exercise without signs/symptoms of physical distress.  Continue with 45 min of aerobic exercise without signs/symptoms of physical distress.  Continue with 45 min of aerobic exercise without signs/symptoms of physical distress.  Continue with 45 min of aerobic exercise without signs/symptoms of physical distress.   Intensity  THRR unchanged  THRR unchanged  THRR unchanged  THRR unchanged  THRR unchanged     Progression   Progression  Continue to progress workloads to maintain intensity without signs/symptoms of physical distress.  Continue to progress workloads to maintain intensity without signs/symptoms of physical distress.   Continue to progress workloads to maintain intensity without signs/symptoms of physical distress.  Continue to progress workloads to maintain intensity without signs/symptoms of physical distress.  Continue to progress workloads to maintain intensity without signs/symptoms of physical distress.     Resistance Training   Training Prescription  Yes  Yes  Yes  Yes  Yes   Weight  blue bands  blue bands  blue bands  blue bands  blue bands   Reps  10-15  10-15  10-15  10-15  10-15   Time  10 Minutes  10 Minutes  10 Minutes  10 Minutes  10 Minutes     Oxygen   Oxygen  Continuous  Continuous  Continuous  Continuous  Continuous   Liters  3  3  3  3  3      NuStep   Level  4  -  4  4  4    Minutes  17  -  17  17  17    METs  2.1  -  2.4  1.9  2.4     Arm Ergometer   Level  3  3  4   -  4   Minutes  17  17  17   -  17  Track   Laps  11  11  -  [place] Name 07/23/17 1600 07/28/17 1500 07/30/17 1500 08/04/17 1600 08/06/17 1600     Response to Exercise   Blood Pressure (Admit)  124/68  140/64  150/72  134/62  124/60   Blood Pressure (Exercise)  162/60  134/70  166/62  140/64  144/70   Blood Pressure (Exit)  118/68  120/66  131/79  124/56  104/60   Heart Rate (Admit)  89 bpm  91 bpm  87 bpm  93 bpm  90 bpm   Heart Rate (Exercise)  122 bpm  125 bpm  111 bpm  130 bpm  135 bpm   Heart Rate (Exit)  96 bpm  98 bpm  90 bpm  107 bpm  107 bpm   Oxygen Saturation (Admit)  96 %  93 %  96 %  96 %  96 %   Oxygen Saturation (Exercise)  92 %  93 %  92 %  90 %  92 %   Oxygen Saturation (Exit)  97 %  97 %  97 %  98 %  95 %   Rating of Perceived Exertion (Exercise)  13  13  13  14  15    Perceived Dyspnea (Exercise)  2  3  2  1  3    Duration  Continue with 45 min of aerobic exercise without signs/symptoms of physical distress.  Continue with 45 min of aerobic exercise without signs/symptoms of physical distress.  Continue with 45 min of aerobic exercise without signs/symptoms of  physical distress.  Continue with 45 min of aerobic exercise without signs/symptoms of physical distress.  Continue with 45 min of aerobic exercise without signs/symptoms of physical distress.   Intensity  THRR unchanged  THRR unchanged  THRR unchanged  THRR unchanged  THRR unchanged     Progression   Progression  Continue to progress workloads to maintain intensity without signs/symptoms of physical distress.  Continue to progress workloads to maintain intensity without signs/symptoms of physical distress.  Continue to progress workloads to maintain intensity without signs/symptoms of physical distress.  Continue to progress workloads to maintain intensity without signs/symptoms of physical distress.  Continue to progress workloads to maintain intensity without signs/symptoms of physical distress.     Resistance Training   Training Prescription  Yes  Yes  Yes  Yes  Yes   Weight  blue bands  blue bands  blue bands  blue bands  blue bands   Reps  10-15  10-15  10-15  10-15  10-15   Time  10 Minutes  10 Minutes  10 Minutes  10 Minutes  10 Minutes     Oxygen   Oxygen  Continuous  Continuous  Continuous  Continuous  Continuous   Liters  3  3-4  3-4  3-4  3-4     NuStep   Level  4  4  4  5  6    Minutes  17  17  17  17  17    METs  1.2  2.2  2.2  2.4  2.7     Arm Ergometer   Level  -  4  5  5   -   Minutes  -  17  17  17   -     Track   Laps  8  8  -  8  10  Minutes  17  17  -  [place] to continue exercise at  Home (comment)  -  -  -  -   Frequency  Add 3 additional days to program exercise sessions.  -  -  -  -   Row Name 08/18/17 1500 08/20/17 1554 08/25/17 1538 08/27/17 1546 09/01/17 1500     Response to Exercise   Blood Pressure (Admit)  168/70  150/64  110/60  142/58  130/70   Blood Pressure (Exercise)  140/60  170/62  140/76  150/70  130/80   Blood Pressure (Exit)  134/62  138/80  120/70  140/80  126/70   Heart Rate (Admit)  106 bpm  76 bpm  87 bpm   98 bpm  82 bpm   Heart Rate (Exercise)  115 bpm  124 bpm  136 bpm  131 bpm  116 bpm   Heart Rate (Exit)  97 bpm  97 bpm  110 bpm  106 bpm  100 bpm   Oxygen Saturation (Admit)  91 %  93 %  96 %  94 %  94 %   Oxygen Saturation (Exercise)  92 %  87 %  90 %  86 %  92 %   Oxygen Saturation (Exit)  96 %  96 %  98 %  96 %  96 %   Rating of Perceived Exertion (Exercise)  13  13  13  12  13    Perceived Dyspnea (Exercise)  2  3  3  1  3    Duration  Continue with 45 min of aerobic exercise without signs/symptoms of physical distress.  Continue with 45 min of aerobic exercise without signs/symptoms of physical distress.  Continue with 45 min of aerobic exercise without signs/symptoms of physical distress.  Continue with 45 min of aerobic exercise without signs/symptoms of physical distress.  Continue with 45 min of aerobic exercise without signs/symptoms of physical distress.   Intensity  THRR unchanged  THRR unchanged  THRR unchanged  THRR unchanged  THRR unchanged     Progression   Progression  Continue to progress workloads to maintain intensity without signs/symptoms of physical distress.  Continue to progress workloads to maintain intensity without signs/symptoms of physical distress.  Continue to progress workloads to maintain intensity without signs/symptoms of physical distress.  Continue to progress workloads to maintain intensity without signs/symptoms of physical distress.  Continue to progress workloads to maintain intensity without signs/symptoms of physical distress.     Resistance Training   Training Prescription  Yes  Yes  Yes  Yes  Yes   Weight  blue bands  blue bands  blue bands  blue bands  blue bands   Reps  10-15  10-15  10-15  10-15  10-15   Time  10 Minutes  10 Minutes  10 Minutes  10 Minutes  10 Minutes     Interval Training   Interval Training  -  -  -  -  No     Oxygen   Oxygen  Continuous  Continuous  Continuous  Continuous  Continuous   Liters  3.-4  3.-4  4  4  4       NuStep   Level  6  -  6  -  6   Minutes  17  -  17  -  17   METs  2.3  -  2.6  -  2.8  Arm Ergometer   Level  6  6  6.5  7  7    Minutes  34  17  17  17  17      Track   Laps  -  9  11  13  11    Minutes  -  17  17  17  17    Row Name 09/22/17 1500 09/29/17 1500 10/01/17 1600 10/06/17 1533       Response to Exercise   Blood Pressure (Admit)  122/64  156/70  138/72  134/54    Blood Pressure (Exercise)  170/82  138/60  180/80  140/80    Blood Pressure (Exit)  130/56  120/78  114/64  122/68    Heart Rate (Admit)  96 bpm  94 bpm  100 bpm  101 bpm    Heart Rate (Exercise)  129 bpm  125 bpm  131 bpm  118 bpm    Heart Rate (Exit)  99 bpm  100 bpm  105 bpm  99 bpm    Oxygen Saturation (Admit)  96 %  94 %  96 %  96 %    Oxygen Saturation (Exercise)  90 %  89 %  92 %  92 %    Oxygen Saturation (Exit)  95 %  95 %  96 %  99 %    Rating of Perceived Exertion (Exercise)  13  15  13  13     Perceived Dyspnea (Exercise)  3  3  1  3     Duration  Continue with 45 min of aerobic exercise without signs/symptoms of physical distress.  Continue with 45 min of aerobic exercise without signs/symptoms of physical distress.  Continue with 45 min of aerobic exercise without signs/symptoms of physical distress.  Continue with 45 min of aerobic exercise without signs/symptoms of physical distress.    Intensity  THRR unchanged  THRR unchanged  THRR unchanged  THRR unchanged      Progression   Progression  Continue to progress workloads to maintain intensity without signs/symptoms of physical distress.  Continue to progress workloads to maintain intensity without signs/symptoms of physical distress.  Continue to progress workloads to maintain intensity without signs/symptoms of physical distress.  Continue to progress workloads to maintain intensity without signs/symptoms of physical distress.      Resistance Training   Training Prescription  Yes  Yes  Yes  Yes    Weight  blue bands  blue bands  blue bands  blue  bands    Reps  10-15  10-15  10-15  10-15    Time  10 Minutes  10 Minutes  10 Minutes  10 Minutes      Oxygen   Oxygen  Continuous  Continuous  Continuous  Continuous    Liters  4  4  4  4       NuStep   Level  6  6  -  6    Minutes  17  17  -  17    METs  2.8  -  -  2.6      Arm Ergometer   Level  7  7  7  7     Minutes  17  17  17  17       Track   Laps  7  14  11  9     Minutes  17  17  17  17        Exercise Comments: Exercise Comments    Row Name  07/28/17 5027           Exercise Comments  Home exercise completed          Exercise Goals and Review:   Exercise Goals Re-Evaluation : Exercise Goals Re-Evaluation    Row Name 05/29/17 1119 06/30/17 0956 07/20/17 1419 08/22/17 1347 09/15/17 0725     Exercise Goal Re-Evaluation   Exercise Goals Review  Increase Strenth and Stamina;Increase Physical Activity  Increase Strength and Stamina;Increase Physical Activity;Able to understand and use Dyspnea scale;Able to understand and use rate of perceived exertion (RPE) scale;Knowledge and understanding of Target Heart Rate Range (THRR);Understanding of Exercise Prescription  Increase Strength and Stamina;Increase Physical Activity;Able to understand and use rate of perceived exertion (RPE) scale;Able to understand and use Dyspnea scale;Knowledge and understanding of Target Heart Rate Range (THRR);Understanding of Exercise Prescription  Increase Strength and Stamina;Increase Physical Activity;Able to understand and use Dyspnea scale;Able to understand and use rate of perceived exertion (RPE) scale;Knowledge and understanding of Target Heart Rate Range (THRR);Understanding of Exercise Prescription  Increase Physical Activity;Increase Strength and Stamina;Able to understand and use Dyspnea scale;Able to understand and use rate of perceived exertion (RPE) scale;Knowledge and understanding of Target Heart Rate Range (THRR);Understanding of Exercise Prescription   Comments  Patient has only  attended one exercise session. Will cont. to monitor and progress as able.   Patient is progressing well in program. Averages 2.0-2.4 METs. She is open to workload changes. Will cont to progress as able.   Patient has had extended absence due to family issues. Will cont. to monitor and progress as able when patient returns.   Patient is progressing well in the program. Works hard and shows motivation. 8-10 laps (200 ft each) in 15 minutes. MET levels fall in "low"category but progression is being shown. Open to workload changes. Will cont. to progress and monitor.  Patient is progressing well in the program. Works hard and shows motivation. 11-13 laps (200 ft each) in 15 minutes. MET levels fall in "low"category but progression is being shown. Open to workload changes. Will cont. to progress and monitor.   Expected Outcomes  Through exercising at rehab and at home, patient will increase physicial capacity, strength, and stamina.   Through exercising at rehab and at home, patient will increase physicial capacity, strength, and stamina.   Through exercising at rehab and at home, patient will increase physicial capacity, strength, and stamina.   Through exercising at rehab and at home, patient will increase physicial capacity, strength, and stamina.   Through exercise at rehab and at home, patient will increase strength and stamina and find that ADL's are easier to perform.    [place] Name 10/05/17 1516             Exercise Goal Re-Evaluation   Exercise Goals Review  Increase Strength and Stamina;Increase Physical Activity;Able to understand and use Dyspnea scale;Able to understand and use rate of perceived exertion (RPE) scale;Knowledge and understanding of Target Heart Rate Range (THRR);Understanding of Exercise Prescription       Comments  Patient is progressing well in the program. Works hard and shows motivation. 11-13 laps (200 ft each) in 15 minutes. MET levels fall in "low"category but progression is  being shown. Open to workload changes. We do plan on changing her equipment assignment to change up her routine. Will touch base again on home exercise and express its importance. Will cont. to progress and monitor.       Expected Outcomes  Through exercise at rehab and  at home, patient will increase strength and stamina and find that ADL's are easier to perform.           Discharge Exercise Prescription (Final Exercise Prescription Changes): Exercise Prescription Changes - 10/06/17 1533      Response to Exercise   Blood Pressure (Admit)  134/54    Blood Pressure (Exercise)  140/80    Blood Pressure (Exit)  122/68    Heart Rate (Admit)  101 bpm    Heart Rate (Exercise)  118 bpm    Heart Rate (Exit)  99 bpm    Oxygen Saturation (Admit)  96 %    Oxygen Saturation (Exercise)  92 %    Oxygen Saturation (Exit)  99 %    Rating of Perceived Exertion (Exercise)  13    Perceived Dyspnea (Exercise)  3    Duration  Continue with 45 min of aerobic exercise without signs/symptoms of physical distress.    Intensity  THRR unchanged      Progression   Progression  Continue to progress workloads to maintain intensity without signs/symptoms of physical distress.      Resistance Training   Training Prescription  Yes    Weight  blue bands    Reps  10-15    Time  10 Minutes      Oxygen   Oxygen  Continuous    Liters  4      NuStep   Level  6    Minutes  17    METs  2.6      Arm Ergometer   Level  7    Minutes  17      Track   Laps  9    Minutes  17       Nutrition:  Target Goals: Understanding of nutrition guidelines, daily intake of sodium <1517m, cholesterol <2056m calories 30% from fat and 7% or less from saturated fats, daily to have 5 or more servings of fruits and vegetables.  Biometrics: Pre Biometrics - 05/11/17 1107      Pre Biometrics   Grip Strength  33 kg        Nutrition Therapy Plan and Nutrition Goals: Nutrition Therapy & Goals - 05/21/17 0829       Nutrition Therapy   Diet  General, Healthful      Personal Nutrition Goals   Nutrition Goal  Identify food quantities necessary to achieve wt loss of  -2# per week to a goal wt loss of 2.7-10.9 kg (6-24 lb) at graduation from pulmonary rehab.      Intervention Plan   Intervention  Prescribe, educate and counsel regarding individualized specific dietary modifications aiming towards targeted core components such as weight, hypertension, lipid management, diabetes, heart failure and other comorbidities.    Expected Outcomes  Short Term Goal: Understand basic principles of dietary content, such as calories, fat, sodium, cholesterol and nutrients.;Long Term Goal: Adherence to prescribed nutrition plan.       Nutrition Discharge: Rate Your Plate Scores: Nutrition Assessments - 05/21/17 0826      Rate Your Plate Scores   Pre Score  54       Nutrition Goals Re-Evaluation:   Nutrition Goals Discharge (Final Nutrition Goals Re-Evaluation):   Psychosocial: Target Goals: Acknowledge presence or absence of significant depression and/or stress, maximize coping skills, provide positive support system. Participant is able to verbalize types and ability to use techniques and skills needed for reducing stress and depression.  Initial Review & Psychosocial Screening: Initial Psych  Review & Screening - 05/11/17 1113      Initial Review   Current issues with  History of Depression      Family Dynamics   Good Support System?  Yes      Barriers   Psychosocial barriers to participate in program  The patient should benefit from training in stress management and relaxation.      Screening Interventions   Interventions  Encouraged to exercise       Quality of Life Scores:   PHQ-9: Recent Review Flowsheet Data    Depression screen [facility] 2/9 05/11/2017 06/20/2016 04/12/2014   Decreased Interest 3 0 0   Down, Depressed, Hopeless 2 0 0   PHQ - 2 Score 5 0 0   Altered sleeping 3 - -   Tired,  decreased energy 3 - -   Change in appetite 3 - -   Feeling bad or failure about yourself  0 - -   Trouble concentrating 0 - -   Moving slowly or fidgety/restless 0 - -   Suicidal thoughts 0 - -   PHQ-9 Score 14 - -   Difficult doing work/chores Somewhat difficult - -     Interpretation of Total Score  Total Score Depression Severity:  1-4 = Minimal depression, 5-9 = Mild depression, 10-14 = Moderate depression, 15-19 = Moderately severe depression, 20-27 = Severe depression   Psychosocial Evaluation and Intervention: Psychosocial Evaluation - 05/11/17 1116      Psychosocial Evaluation & Interventions   Interventions  Physician referral;Therapist referral;Encouraged to exercise with the program and follow exercise prescription    Continue Psychosocial Services   Follow up required by counselor       Psychosocial Re-Evaluation: Psychosocial Re-Evaluation    Row Name 07/01/17 2116 07/24/17 1350 08/24/17 0753 09/14/17 1417 10/08/17 1018     Psychosocial Re-Evaluation   Current issues with  Current Depression;History of Depression  Current Depression;History of Depression  Current Depression;History of Depression  Current Depression;History of Depression  History of Depression   Comments  patient follows up with therapist  patient follows up with therapist  patient follows up with therapist. no psychosocial barriers to participation identified over the past 30 days  patient follows up with therapist. no psychosocial barriers to participation identified over the past 30 days, she feels her depression has improved while in the program.  she feels her depression has improved   Expected Outcomes  patient will remain free from psychosocial barriers to participation in pulmonary rehab  patient will remain free from psychosocial barriers to participation in pulmonary rehab  patient will remain free from psychosocial barriers to participation in pulmonary rehab  patient will remain free from  psychosocial barriers to participation in pulmonary rehab  patient will remain free from psychosocial barriers to participation in pulmonary rehab   Interventions  Encouraged to attend Pulmonary Rehabilitation for the exercise  Encouraged to attend Pulmonary Rehabilitation for the exercise  Encouraged to attend Pulmonary Rehabilitation for the exercise  Encouraged to attend Pulmonary Rehabilitation for the exercise  Encouraged to attend Pulmonary Rehabilitation for the exercise   Continue Psychosocial Services   No Follow up required  No Follow up required  No Follow up required  No Follow up required  No Follow up required      Psychosocial Discharge (Final Psychosocial Re-Evaluation): Psychosocial Re-Evaluation - 10/08/17 1018      Psychosocial Re-Evaluation   Current issues with  History of Depression    Comments  she feels her  depression has improved    Expected Outcomes  patient will remain free from psychosocial barriers to participation in pulmonary rehab    Interventions  Encouraged to attend Pulmonary Rehabilitation for the exercise    Continue Psychosocial Services   No Follow up required       Education: Education Goals: Education classes will be provided on a weekly basis, covering required topics. Participant will state understanding/return demonstration of topics presented.  Learning Barriers/Preferences: Learning Barriers/Preferences - 05/11/17 1102      Learning Barriers/Preferences   Learning Barriers  None    Learning Preferences  Video;Pictoral;Audio;Computer/Internet       Education Topics: Risk Factor Reduction:  -Group instruction that is supported by a PowerPoint presentation. Instructor discusses the definition of a risk factor, different risk factors for pulmonary disease, and how the heart and lungs work together.     PULMONARY REHAB OTHER RESPIRATORY from 08/27/2017 in [place]  Date  05/28/17  Educator  ep  Instruction  Review Code  2- meets goals/outcomes      Nutrition for Pulmonary Patient:  -Group instruction provided by PowerPoint slides, verbal discussion, and written materials to support subject matter. The instructor gives an explanation and review of healthy diet recommendations, which includes a discussion on weight management, recommendations for fruit and vegetable consumption, as well as protein, fluid, caffeine, fiber, sodium, sugar, and alcohol. Tips for eating when patients are short of breath are discussed.   PULMONARY REHAB OTHER RESPIRATORY from 08/27/2017 in [place]  Date  08/20/17  Educator  edna  Instruction Review Code  2- meets goals/outcomes      Pursed Lip Breathing:  -Group instruction that is supported by demonstration and informational handouts. Instructor discusses the benefits of pursed lip and diaphragmatic breathing and detailed demonstration on how to preform both.     Oxygen Safety:  -Group instruction provided by PowerPoint, verbal discussion, and written material to support subject matter. There is an overview of "What is Oxygen" and "Why do we need it".  Instructor also reviews how to create a safe environment for oxygen use, the importance of using oxygen as prescribed, and the risks of noncompliance. There is a brief discussion on traveling with oxygen and resources the patient may utilize.   PULMONARY REHAB OTHER RESPIRATORY from 08/27/2017 in [place]  Date  08/27/17  Educator  Truddie Crumble  Instruction Review Code  2- meets goals/outcomes      Oxygen Equipment:  -Group instruction provided by [employer] Staff utilizing handouts, written materials, and equipment demonstrations.   Signs and Symptoms:  -Group instruction provided by written material and verbal discussion to support subject matter. Warning signs and symptoms of infection, stroke, and heart attack are reviewed and when to call the  physician/911 reinforced. Tips for preventing the spread of infection discussed.   PULMONARY REHAB OTHER RESPIRATORY from 08/27/2017 in [place]  Date  07/23/17  Educator  rn  Instruction Review Code  2- meets goals/outcomes      Advanced Directives:  -Group instruction provided by verbal instruction and written material to support subject matter. Instructor reviews Advanced Directive laws and proper instruction for filling out document.   Pulmonary Video:  -Group video education that reviews the importance of medication and oxygen compliance, exercise, good nutrition, pulmonary hygiene, and pursed lip and diaphragmatic breathing for the pulmonary patient.   PULMONARY REHAB OTHER RESPIRATORY from 08/27/2017 in [facility]  [place]  Date  07/30/17  Instruction Review Code  2- meets goals/outcomes      Exercise for the Pulmonary Patient:  -Group instruction that is supported by a PowerPoint presentation. Instructor discusses benefits of exercise, core components of exercise, frequency, duration, and intensity of an exercise routine, importance of utilizing pulse oximetry during exercise, safety while exercising, and options of places to exercise outside of rehab.     Pulmonary Medications:  -Verbally interactive group education provided by instructor with focus on inhaled medications and proper administration.   PULMONARY REHAB OTHER RESPIRATORY from 08/27/2017 in [place]  Date  07/02/17  Educator  pharmacist  Instruction Review Code  2- meets goals/outcomes      Anatomy and Physiology of the Respiratory System and Intimacy:  -Group instruction provided by PowerPoint, verbal discussion, and written material to support subject matter. Instructor reviews respiratory cycle and anatomical components of the respiratory system and their functions. Instructor also reviews differences in obstructive and  restrictive respiratory diseases with examples of each. Intimacy, Sex, and Sexuality differences are reviewed with a discussion on how relationships can change when diagnosed with pulmonary disease. Common sexual concerns are reviewed.   MD DAY -A group question and answer session with a medical doctor that allows participants to ask questions that relate to their pulmonary disease state.   OTHER EDUCATION -Group or individual verbal, written, or video instructions that support the educational goals of the pulmonary rehab program.   Knowledge Questionnaire Score:   Core Components/Risk Factors/Patient Goals at Admission: Personal Goals and Risk Factors at Admission - 05/11/17 1108      Core Components/Risk Factors/Patient Goals on Admission    Weight Management  Yes    Intervention  Weight Management: Develop a combined nutrition and exercise program designed to reach desired caloric intake, while maintaining appropriate intake of nutrient and fiber, sodium and fats, and appropriate energy expenditure required for the weight goal.    Admit Weight  213 lb 3 oz (96.7 kg)    Goal Weight: Short Term  202 lb 13.2 oz (92 kg)    Goal Weight: Long Term  203 lb (92.1 kg)    Expected Outcomes  Short Term: Continue to assess and modify interventions until short term weight is achieved    Improve shortness of breath with ADL's  Yes    Intervention  Provide education, individualized exercise plan and daily activity instruction to help decrease symptoms of SOB with activities of daily living.    Expected Outcomes  Short Term: Achieves a reduction of symptoms when performing activities of daily living.    Develop more efficient breathing techniques such as purse lipped breathing and diaphragmatic breathing; and practicing self-pacing with activity  Yes    Intervention  Provide education, demonstration and support about specific breathing techniuqes utilized for more efficient breathing. Include  techniques such as pursed lipped breathing, diaphragmatic breathing and self-pacing activity.    Expected Outcomes  Short Term: Participant will be able to demonstrate and use breathing techniques as needed throughout daily activities.    Stress  Yes    Intervention  Offer individual and/or small group education and counseling on adjustment to heart disease, stress management and health-related lifestyle change. Teach and support self-help strategies.    Expected Outcomes  Short Term: Participant demonstrates changes in health-related behavior, relaxation and other stress management skills, ability to obtain effective social support, and compliance with psychotropic medications if prescribed.  Core Components/Risk Factors/Patient Goals Review:  Goals and Risk Factor Review    Row Name 06/02/17 1718 07/01/17 2115 07/24/17 1349 08/24/17 0751 09/14/17 1413     Core Components/Risk Factors/Patient Goals Review   Personal Goals Review  -  Weight Management/Obesity;Improve shortness of breath with ADL's;Develop more efficient breathing techniques such as purse lipped breathing and diaphragmatic breathing and practicing self-pacing with activity.  Weight Management/Obesity;Improve shortness of breath with ADL's;Develop more efficient breathing techniques such as purse lipped breathing and diaphragmatic breathing and practicing self-pacing with activity.  Weight Management/Obesity;Improve shortness of breath with ADL's;Develop more efficient breathing techniques such as purse lipped breathing and diaphragmatic breathing and practicing self-pacing with activity.  Weight Management/Obesity;Improve shortness of breath with ADL's   Review  patient has only attended 2 sessions since admission and too soon to evaluate progress towards goals  patient has mastered the purse lip breathing technique and is often observed using the technique to reduce her shortness of breath. she is not loosing weight but admits to  not changing her caloric intake.  patient has mastered the purse lip breathing technique and is often observed using the technique to reduce her shortness of breath. she is not loosing weight but admits to not changing her caloric intake.  patient continues to work hard in pulmonary rehab. she uses PLB without cueing. He weight remains relatively stable however we celebrate any success even if it is only weight loss by a tenth.  She has increased her workloads on all the equipment, works hard, her wieght continues to stay relatively stable, she loses 1-2 kg, but then it returns easily.  She has been absent 1 week due to an illness.   Expected Outcomes  see admission expected outcomes  see admission expected outcomes  see admission expected outcomes  see admission expected outcomes  see admission expected outcomes   Row Name 10/08/17 1017             Core Components/Risk Factors/Patient Goals Review   Personal Goals Review  Weight Management/Obesity;Improve shortness of breath with ADL's       Review  has lost 2 kg and is focusing on weight loss, progressing well       Expected Outcomes  see admission expected outcomes          Core Components/Risk Factors/Patient Goals at Discharge (Final Review):  Goals and Risk Factor Review - 10/08/17 1017      Core Components/Risk Factors/Patient Goals Review   Personal Goals Review  Weight Management/Obesity;Improve shortness of breath with ADL's    Review  has lost 2 kg and is focusing on weight loss, progressing well    Expected Outcomes  see admission expected outcomes       ITP Comments:   Comments: ITP REVIEW Pt is making expected progress toward pulmonary rehab goals after completing 24 sessions. Recommend continued exercise, life style modification, education, and utilization of breathing techniques to increase stamina and strength and decrease shortness of breath with exertion.

## 2017-10-13 ENCOUNTER — Encounter (HOSPITAL_COMMUNITY): Payer: Medicare Other

## 2017-10-15 ENCOUNTER — Encounter (HOSPITAL_COMMUNITY): Payer: Medicare Other

## 2017-10-20 ENCOUNTER — Encounter (HOSPITAL_COMMUNITY): Payer: Medicare Other

## 2017-10-22 ENCOUNTER — Encounter (HOSPITAL_COMMUNITY): Payer: Medicare Other

## 2017-10-24 ENCOUNTER — Other Ambulatory Visit: Payer: Self-pay | Admitting: Cardiology

## 2017-10-24 ENCOUNTER — Other Ambulatory Visit: Payer: Self-pay | Admitting: Internal Medicine

## 2017-10-24 DIAGNOSIS — I5033 Acute on chronic diastolic (congestive) heart failure: Secondary | ICD-10-CM

## 2017-10-24 DIAGNOSIS — Z951 Presence of aortocoronary bypass graft: Secondary | ICD-10-CM

## 2017-10-24 DIAGNOSIS — I10 Essential (primary) hypertension: Secondary | ICD-10-CM

## 2017-10-24 DIAGNOSIS — J209 Acute bronchitis, unspecified: Secondary | ICD-10-CM

## 2017-10-24 DIAGNOSIS — D62 Acute posthemorrhagic anemia: Secondary | ICD-10-CM

## 2017-10-29 ENCOUNTER — Encounter (HOSPITAL_COMMUNITY): Payer: Medicare Other

## 2017-11-03 DIAGNOSIS — I629 Nontraumatic intracranial hemorrhage, unspecified: Secondary | ICD-10-CM

## 2017-11-03 HISTORY — DX: Nontraumatic intracranial hemorrhage, unspecified: I62.9

## 2017-11-05 ENCOUNTER — Encounter (HOSPITAL_COMMUNITY): Payer: Medicare Other

## 2017-11-06 ENCOUNTER — Ambulatory Visit: Payer: Medicare Other | Admitting: Internal Medicine

## 2017-11-09 ENCOUNTER — Ambulatory Visit (INDEPENDENT_AMBULATORY_CARE_PROVIDER_SITE_OTHER): Payer: Medicare Other | Admitting: Cardiology

## 2017-11-09 ENCOUNTER — Encounter: Payer: Self-pay | Admitting: Cardiology

## 2017-11-09 VITALS — BP 140/80 | HR 97 | Ht 59.0 in | Wt 208.2 lb

## 2017-11-09 DIAGNOSIS — I5033 Acute on chronic diastolic (congestive) heart failure: Secondary | ICD-10-CM | POA: Diagnosis not present

## 2017-11-09 DIAGNOSIS — D62 Acute posthemorrhagic anemia: Secondary | ICD-10-CM

## 2017-11-09 DIAGNOSIS — I2510 Atherosclerotic heart disease of native coronary artery without angina pectoris: Secondary | ICD-10-CM

## 2017-11-09 DIAGNOSIS — R0609 Other forms of dyspnea: Secondary | ICD-10-CM | POA: Diagnosis not present

## 2017-11-09 DIAGNOSIS — I10 Essential (primary) hypertension: Secondary | ICD-10-CM | POA: Diagnosis not present

## 2017-11-09 DIAGNOSIS — E785 Hyperlipidemia, unspecified: Secondary | ICD-10-CM | POA: Diagnosis not present

## 2017-11-09 DIAGNOSIS — R946 Abnormal results of thyroid function studies: Secondary | ICD-10-CM | POA: Diagnosis not present

## 2017-11-09 DIAGNOSIS — J209 Acute bronchitis, unspecified: Secondary | ICD-10-CM

## 2017-11-09 DIAGNOSIS — Z951 Presence of aortocoronary bypass graft: Secondary | ICD-10-CM | POA: Diagnosis not present

## 2017-11-09 MED ORDER — ASPIRIN EC 81 MG PO TBEC: 81.0000 mg | DELAYED_RELEASE_TABLET | Freq: Every day | ORAL | 3 refills | Status: DC

## 2017-11-09 MED ORDER — LOSARTAN POTASSIUM 50 MG PO TABS: 50.0000 mg | ORAL_TABLET | Freq: Every day | ORAL | 3 refills | Status: DC

## 2017-11-09 NOTE — Progress Notes (Signed)
Patient ID: [name], female   DOB: 1945-12-19, 72 y.o.   MRN: [id number]      Cardiology Office Note  Date:  11/09/2017   ID:  [name], DOB [birth year], MRN [id number]  PCP:  Gordy Savers, MD  Cardiologist: Tobias Alexander, MD   CC:    History of Present Illness: [name] is a 72 y.o. female who presents for post hospitalization visit after NSTEMI--> 3 VD --> emergent CABG in July 2016. She is mourning as her husband just passed away. She denies chest pain, SOB, no muscle pain, na palpitations, syncope, no LE edema, orthopnea, PND.  Patient comes after 2 months, at the last visit she complained that she has gained 15 pounds, today she again gained another 8 pounds. She has noticed to get short of breath with any activity but she attributes this to weight gain. She wakes up at night feeling short of breath. She has been having chronic cough for months on with white yellowish sputum. No fevers or chills. No lower extremity edema. No chest pain palpitations or syncope.   07/02/16 - she comes after 3 months, she continues to use home O2 at night and also when she has to do more than mild exertion. Stable SOB, her baseline for several years. She developed productive cough and received antibiotics and steroids from her PCP with some improvement. Denies LE edema, orthopnea or PND. No chest pain or palpitations. She is still very tearful when talking about Joe.   10/03/2016 - the patient is coming after 3 months, in the interim she started to use her home oxygen more frequently as she feel more short of breath, she follows with Dr. Sherene Sires for interstitial lung disease and pulmonary fibrosis, currently on 2 L of oxygen. She denies any chest pain, she is able to do all activities of daily living including shopping and cooking. No palpitations or syncope. She is tolerating atorvastatin well.  01/05/2017 - this is 3 months follow-up, patient is overall doing well with stable shortness of  breath on 2 L of oxygen 24/7, the only change right now is that she feels she needs to use oxygen all the time. She denies any chest pain or shortness of breath. She manages lower extremity edema with 20 mg of Lasix in the morning and as needed in the afternoon but she usually doesn't need to take it. No palpitations or syncope.   11/09/17 - this is one year follow-up, the patient states that from cardiac standpoint she feels stable, she denies any chest pain other than occasional itching in her sternotomy site. She has stable dyspnea on exertion that attributes to her chronic COPD that is O2 dependent 24/7. She's been compliant with her medications, she has been experiencing night cramping. Denies any lower extremity edema claudications, no orthopnea or proximal nocturnal dyspnea and no syncope.   Past Medical History:  Diagnosis Date  . Arthritis    OA RIGHT KNEE WITH PAIN  . Barrett esophagus   . Chronic respiratory failure (HCC)   . COPD (chronic obstructive pulmonary disease) (HCC)   . GERD (gastroesophageal reflux disease)   . History of ARDS 2006  . History of home oxygen therapy    AT NIGHT WHEN SLEEPING 2 L / MIN NASAL CANNULA  . Hypertension   . Hypothyroidism   . NSTEMI (non-ST elevated myocardial infarction) (HCC) 05/31/2015  . Pneumococcal pneumonia (HCC) 2006   HOSPITALIZED AND DEVELOPED ARDS  . Psoriatic arthritis (HCC)   .  Pulmonary fibrosis (HCC)   . Rheumatic disease   . SOB (shortness of breath) on exertion     Past Surgical History:  Procedure Laterality Date  . ABDOMINOPLASTY    . CARDIAC CATHETERIZATION N/A 06/01/2015   Procedure: Left Heart Cath and Coronary Angiography;  Surgeon: Runell Gess, MD;  Location: [facility] INVASIVE CV LAB;  Service: Cardiovascular;  Laterality: N/A;  . CARPAL TUNNEL RELEASE    . CHOLECYSTECTOMY    . CORONARY ARTERY BYPASS GRAFT N/A 06/04/2015   Procedure: CORONARY ARTERY BYPASS GRAFT times three            with left internal mammary  artery and right leg saphenous vein;  Surgeon: Alleen Borne, MD;  Location: [facility] OR;  Service: Open Heart Surgery;  Laterality: N/A;  . cosmetic breast surgery    . JOINT REPLACEMENT    . KNEE ARTHROSCOPY Left   . TEE WITHOUT CARDIOVERSION  06/04/2015   Procedure: TRANSESOPHAGEAL ECHOCARDIOGRAM (TEE);  Surgeon: Alleen Borne, MD;  Location: [facility] OR;  Service: Open Heart Surgery;;  . TOTAL KNEE ARTHROPLASTY Left   . TOTAL KNEE ARTHROPLASTY Right 06/06/2013   Procedure: RIGHT TOTAL KNEE ARTHROPLASTY;  Surgeon: Loanne Drilling, MD;  Location: [facility] ORS;  Service: Orthopedics;  Laterality: Right;   Current Outpatient Medications  Medication Sig Dispense Refill  . acetaminophen (TYLENOL ARTHRITIS PAIN) 650 MG CR tablet Take 1,300 mg by mouth every 8 (eight) hours as needed for pain. Per bottle directions as needed    . albuterol (PROAIR HFA) 108 (90 Base) MCG/ACT inhaler 2 puffs every 4 hours as needed only  if your can't catch your breath 1 Inhaler 11  . atorvastatin (LIPITOR) 80 MG tablet TAKE 1 TABLET BY MOUTH  DAILY AT 6 PM. 90 tablet 1  . budesonide-formoterol (SYMBICORT) 160-4.5 MCG/ACT inhaler Inhale 2 puffs into the lungs 2 (two) times daily. 3 Inhaler 6  . Certolizumab Pegol (CIMZIA [place]) Inject into the skin as directed. Cimzia, Injected by provider as directed    . Cholecalciferol (VITAMIN D3) 2000 UNITS TABS Take 2,000 Int'l Units by mouth daily.    . citalopram (CELEXA) 40 MG tablet TAKE 1 TABLET BY MOUTH  EVERY MORNING 90 tablet 0  . famotidine (PEPCID) 20 MG tablet One at bedtime    . furosemide (LASIX) 20 MG tablet Take 1 tablet (20 mg total) by mouth 2 (two) times daily. 180 tablet 3  . levothyroxine (SYNTHROID, LEVOTHROID) 150 MCG tablet TAKE 1 TABLET BY MOUTH  DAILY BEFORE BREAKFAST 90 tablet 3  . losartan (COZAAR) 50 MG tablet TAKE 1 TABLET BY MOUTH  DAILY 90 tablet 3  . montelukast (SINGULAIR) 10 MG tablet TAKE 1 TABLET BY MOUTH AT  BEDTIME 90 tablet 3  . NON FORMULARY 2lpm with sleep  and with exertion if needed    . pantoprazole (PROTONIX) 40 MG tablet TAKE 1 TABLET BY MOUTH  DAILY 30 TO 60 MINUTES  BEFORE FIRST MEAL OF THE  DAY 90 tablet 2  . traMADol (ULTRAM) 50 MG tablet TAKE 1-2 TABLETS EVERY 6 HOURS AS NEEDED FOR PAIN 90 tablet 0  . triamcinolone cream (KENALOG) 0.1 % Apply 1 application topically 2 (two) times daily as needed (for psorisis).     No current facility-administered medications for this visit.    Allergies:   Patient has no known allergies.   Social History:  The patient  reports that she quit smoking about 12 years ago. Her smoking use included cigarettes. She has  a 87.75 pack-year smoking history. she has never used smokeless tobacco. She reports that she does not drink alcohol or use drugs.   Family History:  The patient's family history includes Breast cancer in her mother; Coronary artery disease in her father; Rheum arthritis in her father.   ROS:  Please see the history of present illness.  All other systems are reviewed and negative.   PHYSICAL EXAM: VS:  BP 140/80   Pulse 97   Ht 4\' 11"  (1.499 m)   Wt 208 lb 3.2 oz (94.4 kg)   SpO2 94%   BMI 42.05 kg/m  , BMI Body mass index is 42.05 kg/m. GEN: Well nourished, well developed, in no acute distress  HEENT: normal  Neck: no JVD, carotid bruits, or masses Cardiac: RRR; no 3/6 systolic murmur, also rub, no gallops , no LE edema  Respiratory:  B/L wheezes. GI: soft, nontender, nondistended, + BS MS: no deformity or atrophy  Skin: warm and dry, no rash Neuro:  Strength and sensation are intact Psych: euthymic mood, full affect  EKG:  SRLAE, iRBBB, anterior MI age undetermined  Recent Labs: No results found for requested labs within last 8760 hours.   Lipid Panel    Component Value Date/Time   CHOL 157 09/29/2016 1335   CHOL 117 07/12/2015 1508   TRIG 101 09/29/2016 1335   TRIG 75 07/12/2015 1508   HDL 76 09/29/2016 1335   HDL 51 07/12/2015 1508   CHOLHDL 2.1 09/29/2016 1335    VLDL 20 09/29/2016 1335   LDLCALC 61 09/29/2016 1335   LDLCALC 51 07/12/2015 1508   LDLDIRECT 131.1 08/10/2012 0829   Wt Readings from Last 3 Encounters:  11/09/17 208 lb 3.2 oz (94.4 kg)  10/06/17 205 lb 0.4 oz (93 kg)  10/06/17 202 lb (91.6 kg)    ECHO: 06/01/2015 - Left ventricle: The cavity size was normal. Wall thickness was increased in a pattern of mild LVH. Systolic function was normal. The estimated ejection fraction was in the range of 55% to 60%. There is hypokinesis of the basalinferolateral myocardium. Doppler parameters are consistent with abnormal left ventricular relaxation (grade 1 diastolic dysfunction). - Mitral valve: Calcified annulus. Mildly thickened leaflets .  Impressions: - Hypokinesis of the basal/mild inferior lateral wall with overall preserved LV function; grade 1 diastolic dysfunction; mild LVH; trace MR.  TELEMETRY:  SR  TTE: 06/01/15 Study Conclusions  - Left ventricle: The cavity size was normal. Wall thickness was increased in a pattern of mild LVH. Systolic function was normal. The estimated ejection fraction was in the range of 55% to 60%. There is hypokinesis of the basalinferolateral myocardium. Doppler parameters are consistent with abnormal left ventricular relaxation (grade 1 diastolic dysfunction). - Mitral valve: Calcified annulus. Mildly thickened leaflets .  Impressions:  - Hypokinesis of the basal/mild inferior lateral wall with overall preserved LV function; grade 1 diastolic dysfunction; mild LVH; trace MR.  TTE: 09/07/2015 - Left ventricle: The cavity size was normal. There was moderate concentric hypertrophy. Systolic function was normal. The estimated ejection fraction was in the range of 60% to 65%. Wall motion was normal; there were no regional wall motion abnormalities. Doppler parameters are consistent with abnormal left ventricular relaxation (grade 1 diastolic  dysfunction). There was no evidence of elevated ventricular filling pressure by Doppler parameters. - Aortic valve: There was no regurgitation. - Aortic root: The aortic root was normal in size. - Mitral valve: Calcified annulus. Mildly thickened leaflets . - Left atrium: The atrium was  mildly dilated. - Right ventricle: The cavity size was normal. Wall thickness was normal. Systolic function was normal. - Right atrium: The atrium was normal in size. - Tricuspid valve: There was trivial regurgitation. - Pulmonic valve: There was no regurgitation. - Pulmonary arteries: Systolic pressure was within the normal range. - Inferior vena cava: The vessel was normal in size. - Pericardium, extracardiac: There was no pericardial effusion.  Impressions: - There is mild basal, mid inferior wall hypokinesis, overall LVEF normal.  EKG - performed on 11/09/2017 was personally reviewed and shows normal sinus rhythm with incomplete right bundle branch block and is unchanged from prior.    ASSESSMENT AND PLAN:  1. CAD, s/p NSTEMI (non-ST elevated myocardial infarction), 3-VD with preserved LV function. Repeat echo showed LVEF 60-65%. On aspirin and atorvastatin, losartan, metoprolol discontinued as she was bradycardic. She is asymptomatic, ECG today unchanged from prior. Tolerating medications. No ischemic work up needed.  2. 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 She is wheezing today, on Symbicort.  3. Acute on chronic diastolic dysfunction - on Lasix 20 mg by mouth daily, afternoon one PRN. She is euvolemic, normal creatinine, will continue the same regimen.  4. HTN - rechecked controlled, continue same regimen..   5. Hyperlipidemia - on atorvastatin, LFTs normal, significant muscle cramping, we will recheck her labs today including magnesium, she is advised to start taking over-the-counter magnesium and hold  atorvastatin for one week, if her symptoms improve she should call us and we'll switch to a different agent otherwise restart it.  6. Hypothyroidism - TSH 12 in 11/16, 8 in 1/17, increased Synthroid to 150 mcg daily. TSH improved to 2.0 in 09/2016.  Follow up in 6 months  Signed, Tobias Alexander, MD  11/09/2017 3:14 PM    [facility] [address] [place], [place], [place]  [address] Phone: [phone]; Fax: [phone]

## 2017-11-09 NOTE — Patient Instructions (Signed)
Medication Instructions:  Your physician has recommended you make the following change in your medication:  DECREASE: aspirin to 81 mg once a day  Labwork: Today for kidney function, magnesium, complete blood count, thyroid, liver function and fasting lipids  Testing/Procedures: None ordered   Follow-Up: Your physician wants you to follow-up in: 6 months with Dr. Delton See. You will receive a reminder letter in the mail two months in advance. If you don't receive a letter, please call our office to schedule the follow-up appointment.   Any Other Special Instructions Will Be Listed Below (If Applicable).     If you need a refill on your cardiac medications before your next appointment, please call your pharmacy.

## 2017-11-10 ENCOUNTER — Encounter: Payer: Self-pay | Admitting: Internal Medicine

## 2017-11-10 ENCOUNTER — Ambulatory Visit (INDEPENDENT_AMBULATORY_CARE_PROVIDER_SITE_OTHER): Payer: Medicare Other | Admitting: Internal Medicine

## 2017-11-10 ENCOUNTER — Encounter (HOSPITAL_COMMUNITY): Payer: Medicare Other

## 2017-11-10 VITALS — BP 134/60 | HR 84 | Ht 59.0 in | Wt 208.6 lb

## 2017-11-10 DIAGNOSIS — J9611 Chronic respiratory failure with hypoxia: Secondary | ICD-10-CM

## 2017-11-10 DIAGNOSIS — J9612 Chronic respiratory failure with hypercapnia: Secondary | ICD-10-CM

## 2017-11-10 DIAGNOSIS — I2510 Atherosclerotic heart disease of native coronary artery without angina pectoris: Secondary | ICD-10-CM

## 2017-11-10 DIAGNOSIS — J45991 Cough variant asthma: Secondary | ICD-10-CM

## 2017-11-10 DIAGNOSIS — J8410 Pulmonary fibrosis, unspecified: Secondary | ICD-10-CM

## 2017-11-10 LAB — BASIC METABOLIC PANEL
BUN/Creatinine Ratio: 15 (ref 12–28)
BUN: 13 mg/dL (ref 8–27)
CO2: 31 mmol/L — ABNORMAL HIGH (ref 20–29)
Calcium: 10.1 mg/dL (ref 8.7–10.3)
Chloride: 94 mmol/L — ABNORMAL LOW (ref 96–106)
Creatinine, Ser: 0.88 mg/dL (ref 0.57–1.00)
GFR calc Af Amer: 77 mL/min/{1.73_m2} (ref >59)
GFR calc non Af Amer: 67 mL/min/{1.73_m2} (ref >59)
Glucose: 103 mg/dL — ABNORMAL HIGH (ref 65–99)
Potassium: 4.1 mmol/L (ref 3.5–5.2)
Sodium: 143 mmol/L (ref 134–144)

## 2017-11-10 LAB — CBC
Hematocrit: 36.6 % (ref 34.0–46.6)
Hemoglobin: 11.8 g/dL (ref 11.1–15.9)
MCH: 27.8 pg (ref 26.6–33.0)
MCHC: 32.2 g/dL (ref 31.5–35.7)
MCV: 86 fL (ref 79–97)
Platelets: 196 10*3/uL (ref 150–379)
RBC: 4.25 x10E6/uL (ref 3.77–5.28)
RDW: 14.4 % (ref 12.3–15.4)
WBC: 9.6 10*3/uL (ref 3.4–10.8)

## 2017-11-10 LAB — LIPID PANEL
Chol/HDL Ratio: 2.2 ratio (ref 0.0–4.4)
Cholesterol, Total: 148 mg/dL (ref 100–199)
HDL: 67 mg/dL (ref >39)
LDL Calculated: 58 mg/dL (ref 0–99)
Triglycerides: 117 mg/dL (ref 0–149)
VLDL Cholesterol Cal: 23 mg/dL (ref 5–40)

## 2017-11-10 LAB — HEPATIC FUNCTION PANEL
ALT: 15 IU/L (ref 0–32)
AST: 18 IU/L (ref 0–40)
Albumin: 4.3 g/dL (ref 3.5–4.8)
Alkaline Phosphatase: 70 IU/L (ref 39–117)
Bilirubin Total: 0.3 mg/dL (ref 0.0–1.2)
Bilirubin, Direct: 0.1 mg/dL (ref 0.00–0.40)
Total Protein: 7.5 g/dL (ref 6.0–8.5)

## 2017-11-10 LAB — MAGNESIUM: Magnesium: 1.8 mg/dL (ref 1.6–2.3)

## 2017-11-10 LAB — TSH: TSH: 0.45 u[IU]/mL (ref 0.450–4.500)

## 2017-11-10 MED ORDER — PREDNISONE 10 MG PO TABS: ORAL_TABLET | ORAL | 0 refills | Status: DC

## 2017-11-10 NOTE — Patient Instructions (Addendum)
Goal is to keep sats above 90%  - call if you want [facility] to re-evaluate your ambulatory needs   Prednisone 10 mg take  4 each am x 2 days,   2 each am x 2 days,  1 each am x 2 days and stop    Work on inhaler technique:  relax and gently blow all the way out then take a nice smooth deep breath back in, triggering the inhaler at same time you start breathing in.  Hold for up to 5 seconds if you can. Blow out thru nose. Rinse and gargle with water when done   Please schedule a follow up visit in 3 months but call sooner if needed with pft's on return

## 2017-11-10 NOTE — Progress Notes (Signed)
Subjective:   Patient ID: [name], female    DOB: 05-19-1946    MRN: [id number]   Brief patient profile:  71 yowf quit smoking in May 2006 with dx pna/ ards resumed full activity including yardwork at wt 140 and pft's c/w restrictive changes 04/2010 at wt 170 but reported improvement on saba so maintained chronically on symbicort 160 despite no sign airflow obst on pfts which was confirmed  06/04/15    History of Present Illness  July 18, 2010  1st pulmonary office eval in ER era c/o doe x 6 months progressive indolent onset with copd vs pf.  Ultimately required hosp 8/30-9/1 dx copd/pf ? cause on sulfasalzine and ACE inhib better on 02 and advair but very hoarse with sense of chest congestion and cough with white mucus day > night.  doe x > slow adls.stop advair Start symbicort 160 2 puffs first thing  in am and 2 puffs again in pm about 12 hours later  Work on inhaler technique:   stop benazapril start benicar 40/25  one daily Use 02 sleeping and any activity other than sitting still   04/10/2014 f/u ov/Ziere Docken re: restrictive lung dz/ RA/psoriatric/ symbicort 160 2bid  Chief Complaint  Patient presents with  . Follow-up    Pt states was advised to f/u per [employer].  Pt has been having increased SOB- relates to stress from dealing with her spouse's illness. She is using o2 pretty much 24/7.   trying to do more than baseline activity and finds she needs 02 more. rec Work on inhaler technique:   Ok to just use the symbicort 160 2 puffs in am to see what difference if any this makes in your breathing  Change 02 to 2lpm 24/7 and use 3lpm with activity    06/04/15  cabg     07/22/2016  f/u ov/Cabe Lashley re: PF sp ali/ AB/ symbicort 160 2bid on 02 2lpm at rest/ 3lpm with activity  Chief Complaint  Patient presents with  . Follow-up    Breathing is "not as good as it could be".  Breathing has been worse for the past 2 months. She gets winded just making the bed. She is also c/o prod cough with  yellow sputum and occ wheezing.   sob gradually worse x 2 m already tried on abx/pred per PC no better, using omeprazole 20mg  9am/ does not have a rescue inhaler at all  rec Prednisone 10 mg take  4 each am x 2 days,   2 each am x 2 days,  1 each am x 2 days and stop  Plan A = Automatic = Symbicort 160 Take 2 puffs first thing in am and then another 2 puffs about 12 hours later.  Work on inhaler technique:  Plan B = Backup Only use your albuterol(proair)  as a rescue medication Pantoprazole (protonix) 40 mg (same as omeprzole 20 mg x2)   Take  30-60 min before first meal of the day and Pepcid (famotidine)  20 mg one @  bedtime until return to office - this is the best way to tell whether stomach acid is contributing to your problem.   GERD (REFLUX)  is an extremely common  Please see patient coordinator before you leave today  to schedule portable 02 evaluation Please remember to go to the  x-ray department downstairs for your tests - we will call you with the results when they are available.   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 Follow-up    Breathing has improved some. She is coughing less, but still coughing up beige sputum.  She has not used albuterol inhaler.   cough assoc with variable nasal stuffiness / much better with pred  Worse with laughing / not typically noct but some at hs  pred helps the cough but not the doe  rec Plan A = Automatic = symicort 160 Take 2 puffs first thing in am and then another 2 puffs about 12 hours later.                                      Add singulair 10 mg one each pm Plan B = Backup Only use your albuterol as a rescue medication   10/29/2016  f/u ov/Frazer [place] re:  Cough variant asthma on symb 160 2bid and singulair  02 2lpm hs and prn daytime  Chief Complaint  Patient presents with  . Follow-up    Breathing is  overall doing well. She has occ wheezing. She rarely uses albuterol inhaler.   rarely  Needs  albuerol   Using 02 with activity 2lpm and sats ok up to 3lpm rarely needed now [facility] = can't walk a nl pace on a flat grade s sob but does fine slow and flat eg slow shopping always on 02 and planning mall walking    rec Plan A = Automatic = symicort 160 Take 2 puffs first thing in am and then another 2 puffs about 12 hours later.                                      Add singulair 10 mg one each pm Plan B = Backup Only use your albuterol as a rescue medication      04/29/2017  f/u ov/Reyann Troop re: cough variant asthma/symb 160 and singulair  Chief Complaint  Patient presents with  . Follow-up    Increased cough for the past wk- prod with yellow sputum.  She states she "always has wheezing". She has not used her albuterol inhaler.   Marland KitchenMMRC2 = can't walk a nl pace on a flat grade s sob but does fine slow and flat eg shopping ok on 2lpm pulsed but does not check sats Sleeps fine on 2lpm 30 degrees with books under mattresss > elevation about 10-20degrees and sleeps ok  rec No change in medications Always wear the 02 at least 2lpm when you walk and turn it up if you need to maintain over 90% Please see patient coordinator before you leave today  to schedule pulmonary rehab Please schedule a follow up visit in 6 months but call sooner if needed     11/10/2017  f/u ov/Cashmere Harmes re: AB / pf related to ALI vs RA / MO  Chief Complaint  Patient presents with  . Follow-up    Breathing has been worse over the past 1-2 months. She states she gets winded just walking from her house to her car. She has been using her albuterol inhaler 4 x daily on average.   on 2lpm at rest and sleeping needed up to 4lpm continous while doing rehab  but only has pulsed to 3lpm  at home per [employer] Much better only while on prednisone / poor hfa noted  Doe = MMRC3 = can't walk 100 yards even at a slow pace at a flat grade s stopping due  to sob  Even on 02 but not sure about sats as does not check    No obvious day to day or daytime variability or assoc excess/ purulent sputum or mucus plugs or hemoptysis or cp or chest tightness, subjective wheeze or overt sinus or hb symptoms. No unusual exposure hx or h/o childhood pna/ asthma or knowledge of premature birth.  Sleeping ok flat without nocturnal  or early am exacerbation  of respiratory  c/o's or need for noct saba. Also denies any obvious fluctuation of symptoms with weather or environmental changes or other aggravating or alleviating factors except as outlined above   Current Allergies, Complete Past Medical History, Past Surgical History, Family History, and Social History were reviewed in [employer] record.  ROS  The following are not active complaints unless bolded Hoarseness, sore throat, dysphagia, dental problems, itching, sneezing,  nasal congestion or discharge of excess mucus or purulent secretions, ear ache,   fever, chills, sweats, unintended wt loss or wt gain, classically pleuritic or exertional cp,  orthopnea pnd or leg swelling, presyncope, palpitations, abdominal pain, anorexia, nausea, vomiting, diarrhea  or change in bowel habits or change in bladder habits, change in stools or change in urine, dysuria, hematuria,  rash, arthralgias, visual complaints, headache, numbness, weakness or ataxia or problems with walking or coordination,  change in mood/affect or memory.        Current Meds  Medication Sig  . acetaminophen (TYLENOL ARTHRITIS PAIN) 650 MG CR tablet Take 1,300 mg by mouth every 8 (eight) hours as needed for pain. Per bottle directions as needed  . albuterol (PROAIR HFA) 108 (90 Base) MCG/ACT inhaler 2 puffs every 4 hours as needed only  if your can't catch your breath  . aspirin EC 81 MG tablet Take 1 tablet (81 mg total) by mouth daily.  Marland Kitchen atorvastatin (LIPITOR) 80 MG tablet TAKE 1 TABLET BY MOUTH  DAILY AT 6 PM.  .  budesonide-formoterol (SYMBICORT) 160-4.5 MCG/ACT inhaler Inhale 2 puffs into the lungs 2 (two) times daily.  . Certolizumab Pegol (CIMZIA [place]) Inject into the skin as directed. Cimzia, Injected by provider as directed  . Cholecalciferol (VITAMIN D3) 2000 UNITS TABS Take 2,000 Int'l Units by mouth daily.  . citalopram (CELEXA) 40 MG tablet TAKE 1 TABLET BY MOUTH  EVERY MORNING  . famotidine (PEPCID) 20 MG tablet One at bedtime  . furosemide (LASIX) 20 MG tablet Take 1 tablet (20 mg total) by mouth 2 (two) times daily.  Marland Kitchen levothyroxine (SYNTHROID, LEVOTHROID) 150 MCG tablet TAKE 1 TABLET BY MOUTH  DAILY BEFORE BREAKFAST  . montelukast (SINGULAIR) 10 MG tablet TAKE 1 TABLET BY MOUTH AT  BEDTIME  . NON FORMULARY 2lpm with sleep and with exertion if needed  . pantoprazole (PROTONIX) 40 MG tablet TAKE 1 TABLET BY MOUTH  DAILY 30 TO 60 MINUTES  BEFORE FIRST MEAL OF THE  DAY  . traMADol (ULTRAM) 50 MG tablet TAKE 1-2 TABLETS EVERY 6 HOURS AS NEEDED FOR PAIN  . triamcinolone cream (KENALOG) 0.1 % Apply 1 application topically 2 (two) times daily as needed (for psorisis).                  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       - CT chest 07/03/10 Nonspecific PF mostly upper lobes       - CT chest 12/03/10 acute gg changes and effusions c/w chf        - PFT's  04/12/10 FEV1  1.21 (69%) ratio 77 and no change p B2,  DLC0 56% Asthmatic bronchitis     - HFA 50% p coaching July 18, 2010 >>  90%  02/25/2011  history of mild vitamin D deficiency        Objective:   Physical Exam  amb mildly  hoarse wf nad  Vital signs  reviewed - Note on arrival 02 sats  96% on 3lpm pulsed        wt 192 July 18, 2010 > 197 August 21, 2010 > 207 December 05, 2010  > 183  01/21/2011  > 179 02/25/2011 >189 08/10/2012 >195 09/08/12 >200 12/27/12 > 202 05/03/2013 > 06/28/2013  199 > 08/08/13 194 > 04/10/2014  203 > 04/19/2015   176 >  07/23/2015 166 > 09/16/2016  210 >  10/29/2016  209 > 04/29/2017    206 > 11/10/2017   209      .HEENT: nl turbinates bilaterally, and oropharynx. Nl external ear canals without cough reflex - top dentures    NECK :  without JVD/Nodes/TM/ nl carotid upstrokes bilaterally   LUNGS: no acc muscle use,  Nl contour chest  With late bilateral exp rhonchi    CV:  RRR  no s3 or murmur or increase in P2, and no edema   ABD:  soft and nontender with nl inspiratory excursion in the supine position. No bruits or organomegaly appreciated, bowel sounds nl  MS:  Nl gait/ ext warm without deformities, calf tenderness, cyanosis or clubbing No obvious joint restrictions   SKIN: warm and dry without lesions    NEURO:  alert, approp, nl sensorium with  no motor or cerebellar deficits apparent.          I personally reviewed images and agree with radiology impression as follows:  CXR:   09/16/17 Stable mild chronic interstitial lung disease. No active cardiopulmonary disease.

## 2017-11-11 ENCOUNTER — Encounter: Payer: Self-pay | Admitting: Internal Medicine

## 2017-11-11 NOTE — Assessment & Plan Note (Signed)
-  s/p ARDS 2006 with bacteremic S  Pna       - CT chest 07/03/10 Nonspecific PF mostly upper lobes       - CT chest 12/03/10 acute gg changes and effusions c/w chf        - PFT's  04/12/10 FEV1  1.21 (69%) ratio 77 and no change p B2,  DLC0 56%   VC 70%         - PFTs  08/08/2013 FEV1 1.21 (60%) ratio 86 and no change p B2 DLCO 79%  VC 72%  On symbicort 160 2bid   Repeat pft's at next ov

## 2017-11-11 NOTE — Assessment & Plan Note (Addendum)
-   02 dependent  since 07/02/10 >>  83% RA December 05, 2010       - ONO RA 08/05/12  :  Positive sat < 89 x 2:63m> repeat on 2lpm rec 08/12/2012  - 06/17/2013 reported desat with activity p Knee surgery > rec restart 2lpm with activity  - 06/27/2013   Walked 2lpm  x one lap @ 185 stopped due to sat 88% not sob , desat to 82% on RA just at the  Start of the walk  - 04/10/14  Sats  81% RA and then  Walked 3lpm x  2 laps @ 185 ft each stopped due to end of study, no desat  - HCO3  11/09/17  = 31 c/w improving hypercarbia  11/10/2017    rec 2lpm at rest/ 3lpm with activity /POC> titrate up to keep > 90%  As has portable tanks and poc  Probably needs another ambulatory 02 titration to see if adequate, pt will contact [facility] if her POC not providing enough 02 for her level of activity noting that needed 4lpm continuous with rehab and no POC provides this so may need to switch over to her portable tanks when doing high levels of ex

## 2017-11-11 NOTE — Assessment & Plan Note (Signed)
-   PFT's  06/04/15  FEV1 1.20 (67 % ) ratio 83  p 6 % improvement from saba with DLCO  80 % corrects to 132 % for alv volume      - Clinical dx based on response to symbicort      FENO 09/16/2016  =   96 on symbicort 160 2bid > added singulair  Allergy profile 09/16/2016 >  Eos 0.5 /  IgE  78 neg RAST  -  Referred to rehab 04/29/2017 > completed 10/2017   - 11/10/2017  After extensive coaching inhaler device  effectiveness =    75% (short Ti)   Reports feels a lot better on prednisone but not consistently while on high dose ics likely related to poor technique and limited Insp capacity  rec  Prednisone 10 mg take  4 each am x 2 days,   2 each am x 2 days,  1 each am x 2 days and stop and keep working on [facility]  I had an extended discussion with the patient reviewing all relevant studies completed to date and  lasting 15 to 20 minutes of a 25 minute visit    Each maintenance medication was reviewed in detail including most importantly the difference between maintenance and prns and under what circumstances the prns are to be triggered using an action plan format that is not reflected in the computer generated alphabetically organized AVS.    Please see AVS for specific instructions unique to this visit that I personally wrote and verbalized to the the pt in detail and then reviewed with pt  by my nurse highlighting any  changes in therapy recommended at today's visit to their plan of care.

## 2017-11-11 NOTE — Assessment & Plan Note (Signed)
pfts with erv 14% 06/04/15   Body mass index is 42.13 kg/m.  -  trending back up  Lab Results  Component Value Date   TSH 0.450 11/09/2017     Contributing to gerd risk/ doe/reviewed the need and the process to achieve and maintain neg calorie balance > defer f/u primary care including intermittently monitoring thyroid status

## 2017-11-12 ENCOUNTER — Encounter (HOSPITAL_COMMUNITY): Payer: Medicare Other

## 2017-11-16 ENCOUNTER — Telehealth: Payer: Self-pay | Admitting: Internal Medicine

## 2017-11-16 NOTE — Telephone Encounter (Signed)
Spoke with pt. States that her cough is improved but is not completely gone. Cough is non productive. Denies chest tightness, wheezing, SOB, fever or body aches. At her last OV with MW, she was given 6 days worth of prednisone. She took the last tablet this morning. Pt wants to know if she needs more prednisone or if she should just "ride this out."  MW - please advise. Thanks.

## 2017-11-16 NOTE — Telephone Encounter (Signed)
She should be taking mucinex dm 1200 mg every 12 hours as needed also and if not better by end of week ov with all meds in hand

## 2017-11-16 NOTE — Telephone Encounter (Signed)
Called pt letting her know to take mucinex dm 1200mg  every 12 hours as needed to see if that would help with her symptoms.  Told pt if she is no better by Thursday to call Monday back to see if we can get her scheduled for an ov Friday. Pt expressed understanding. Nothing further needed.

## 2017-11-17 ENCOUNTER — Encounter (HOSPITAL_COMMUNITY): Payer: Medicare Other

## 2017-11-19 ENCOUNTER — Encounter (HOSPITAL_COMMUNITY): Payer: Medicare Other

## 2017-11-23 DIAGNOSIS — Z79899 Other long term (current) drug therapy: Secondary | ICD-10-CM | POA: Diagnosis not present

## 2017-11-23 DIAGNOSIS — M150 Primary generalized (osteo)arthritis: Secondary | ICD-10-CM | POA: Diagnosis not present

## 2017-11-23 DIAGNOSIS — I252 Old myocardial infarction: Secondary | ICD-10-CM | POA: Diagnosis not present

## 2017-11-23 DIAGNOSIS — L409 Psoriasis, unspecified: Secondary | ICD-10-CM | POA: Diagnosis not present

## 2017-11-23 DIAGNOSIS — M0589 Other rheumatoid arthritis with rheumatoid factor of multiple sites: Secondary | ICD-10-CM | POA: Diagnosis not present

## 2017-11-23 DIAGNOSIS — Z6841 Body Mass Index (BMI) 40.0 and over, adult: Secondary | ICD-10-CM | POA: Diagnosis not present

## 2017-11-24 ENCOUNTER — Encounter (HOSPITAL_COMMUNITY): Payer: Medicare Other

## 2017-11-26 ENCOUNTER — Encounter (HOSPITAL_COMMUNITY): Payer: Medicare Other

## 2017-11-27 NOTE — Addendum Note (Signed)
Encounter addended by: Jacques Earthly, RD on: 11/27/2017 11:15 AM  Actions taken: Flowsheet data copied forward, Visit Navigator Flowsheet section accepted

## 2017-11-30 ENCOUNTER — Encounter: Payer: Self-pay | Admitting: *Deleted

## 2017-11-30 NOTE — Addendum Note (Signed)
Encounter addended by: Drema Pry, RN on: 11/30/2017 9:48 AM  Actions taken: Sign clinical note, Episode resolved

## 2017-11-30 NOTE — Progress Notes (Signed)
Discharge Progress Report  Patient Details  Name: [name] MRN: [id number] Date of Birth: [birth year] Referring Provider:     Pulmonary Rehab Walk Test from 05/21/2017 in [facility]  Referring Provider  Dr. Sherene Sires       Number of Visits: 24  Reason for Discharge:  Patient reached a stable level of exercise.  Smoking History:  Social History   Tobacco Use  Smoking Status Former Smoker  . Packs/day: 1.50  . Years: 58.50  . Pack years: 87.75  . Types: Cigarettes  . Last attempt to quit: 03/03/2005  . Years since quitting: 12.7  Smokeless Tobacco Never Used    Diagnosis:  Pulmonary fibrosis (HCC)  ADL UCSD:   Initial Exercise Prescription:   Discharge Exercise Prescription (Final Exercise Prescription Changes): Exercise Prescription Changes - 10/06/17 1533      Response to Exercise   Blood Pressure (Admit)  134/54    Blood Pressure (Exercise)  140/80    Blood Pressure (Exit)  122/68    Heart Rate (Admit)  101 bpm    Heart Rate (Exercise)  118 bpm    Heart Rate (Exit)  99 bpm    Oxygen Saturation (Admit)  96 %    Oxygen Saturation (Exercise)  92 %    Oxygen Saturation (Exit)  99 %    Rating of Perceived Exertion (Exercise)  13    Perceived Dyspnea (Exercise)  3    Duration  Continue with 45 min of aerobic exercise without signs/symptoms of physical distress.    Intensity  THRR unchanged      Progression   Progression  Continue to progress workloads to maintain intensity without signs/symptoms of physical distress.      Resistance Training   Training Prescription  Yes    Weight  blue bands    Reps  10-15    Time  10 Minutes      Oxygen   Oxygen  Continuous    Liters  4      NuStep   Level  6    Minutes  17    METs  2.6      Arm Ergometer   Level  7    Minutes  17      Track   Laps  9    Minutes  17       Functional Capacity:   Psychological, QOL, Others - Outcomes: PHQ 2/9: Depression screen [facility] 2/9  05/11/2017 06/20/2016 04/12/2014  Decreased Interest 3 0 0  Down, Depressed, Hopeless 2 0 0  PHQ - 2 Score 5 0 0  Altered sleeping 3 - -  Tired, decreased energy 3 - -  Change in appetite 3 - -  Feeling bad or failure about yourself  0 - -  Trouble concentrating 0 - -  Moving slowly or fidgety/restless 0 - -  Suicidal thoughts 0 - -  PHQ-9 Score 14 - -  Difficult doing work/chores Somewhat difficult - -  Some recent data might be hidden    Quality of Life:   Personal Goals: Goals established at orientation with interventions provided to work toward goal.    Personal Goals Discharge: Goals and Risk Factor Review    Row Name 07/01/17 2115 07/24/17 1349 08/24/17 0751 09/14/17 1413 10/08/17 1017     Core Components/Risk Factors/Patient Goals Review   Personal Goals Review  Weight Management/Obesity;Improve shortness of breath with ADL's;Develop more efficient breathing techniques such as purse lipped breathing and diaphragmatic  breathing and practicing self-pacing with activity.  Weight Management/Obesity;Improve shortness of breath with ADL's;Develop more efficient breathing techniques such as purse lipped breathing and diaphragmatic breathing and practicing self-pacing with activity.  Weight Management/Obesity;Improve shortness of breath with ADL's;Develop more efficient breathing techniques such as purse lipped breathing and diaphragmatic breathing and practicing self-pacing with activity.  Weight Management/Obesity;Improve shortness of breath with ADL's  Weight Management/Obesity;Improve shortness of breath with ADL's   Review  patient has mastered the purse lip breathing technique and is often observed using the technique to reduce her shortness of breath. she is not loosing weight but admits to not changing her caloric intake.  patient has mastered the purse lip breathing technique and is often observed using the technique to reduce her shortness of breath. she is not loosing weight but  admits to not changing her caloric intake.  patient continues to work hard in pulmonary rehab. she uses PLB without cueing. He weight remains relatively stable however we celebrate any success even if it is only weight loss by a tenth.  She has increased her workloads on all the equipment, works hard, her wieght continues to stay relatively stable, she loses 1-2 kg, but then it returns easily.  She has been absent 1 week due to an illness.  has lost 2 kg and is focusing on weight loss, progressing well   Expected Outcomes  see admission expected outcomes  see admission expected outcomes  see admission expected outcomes  see admission expected outcomes  see admission expected outcomes      Exercise Goals and Review:   Nutrition & Weight - Outcomes:    Nutrition:   Nutrition Discharge:   Education Questionnaire Score:   Goals reviewed with patient; copy given to patient.

## 2017-12-01 ENCOUNTER — Encounter (HOSPITAL_COMMUNITY): Payer: Medicare Other

## 2017-12-01 DIAGNOSIS — M0589 Other rheumatoid arthritis with rheumatoid factor of multiple sites: Secondary | ICD-10-CM | POA: Diagnosis not present

## 2017-12-03 ENCOUNTER — Encounter (HOSPITAL_COMMUNITY): Payer: Medicare Other

## 2017-12-24 DIAGNOSIS — Z23 Encounter for immunization: Secondary | ICD-10-CM | POA: Diagnosis not present

## 2017-12-31 DIAGNOSIS — M0589 Other rheumatoid arthritis with rheumatoid factor of multiple sites: Secondary | ICD-10-CM | POA: Diagnosis not present

## 2017-12-31 DIAGNOSIS — Z79899 Other long term (current) drug therapy: Secondary | ICD-10-CM | POA: Diagnosis not present

## 2018-01-12 ENCOUNTER — Ambulatory Visit: Payer: Medicare Other | Admitting: Internal Medicine

## 2018-01-13 ENCOUNTER — Other Ambulatory Visit: Payer: Self-pay | Admitting: Cardiology

## 2018-01-13 DIAGNOSIS — R7989 Other specified abnormal findings of blood chemistry: Secondary | ICD-10-CM

## 2018-01-13 DIAGNOSIS — E032 Hypothyroidism due to medicaments and other exogenous substances: Secondary | ICD-10-CM

## 2018-01-14 ENCOUNTER — Telehealth: Payer: Self-pay | Admitting: Cardiology

## 2018-01-14 NOTE — Telephone Encounter (Signed)
[facility] mail pharmacy is requesting a refill on Levothyroxine. Would you like to refill this medication? Please advise

## 2018-01-15 MED ORDER — LEVOTHYROXINE SODIUM 150 MCG PO TABS: ORAL_TABLET | ORAL | 2 refills | Status: DC

## 2018-01-15 NOTE — Telephone Encounter (Signed)
Yes please refill 

## 2018-01-15 NOTE — Telephone Encounter (Signed)
Pt's medication was sent to pt's pharmacy as requested. Confirmation received.  °

## 2018-01-28 DIAGNOSIS — M0589 Other rheumatoid arthritis with rheumatoid factor of multiple sites: Secondary | ICD-10-CM | POA: Diagnosis not present

## 2018-02-08 ENCOUNTER — Ambulatory Visit (INDEPENDENT_AMBULATORY_CARE_PROVIDER_SITE_OTHER): Payer: Medicare Other | Admitting: Internal Medicine

## 2018-02-08 ENCOUNTER — Encounter: Payer: Self-pay | Admitting: Internal Medicine

## 2018-02-08 VITALS — BP 146/68 | HR 96 | Ht <= 58 in | Wt 207.0 lb

## 2018-02-08 DIAGNOSIS — J9612 Chronic respiratory failure with hypercapnia: Secondary | ICD-10-CM | POA: Diagnosis not present

## 2018-02-08 DIAGNOSIS — J449 Chronic obstructive pulmonary disease, unspecified: Secondary | ICD-10-CM | POA: Diagnosis not present

## 2018-02-08 DIAGNOSIS — J8410 Pulmonary fibrosis, unspecified: Secondary | ICD-10-CM

## 2018-02-08 DIAGNOSIS — J9611 Chronic respiratory failure with hypoxia: Secondary | ICD-10-CM

## 2018-02-08 DIAGNOSIS — I2510 Atherosclerotic heart disease of native coronary artery without angina pectoris: Secondary | ICD-10-CM

## 2018-02-08 LAB — PULMONARY FUNCTION TEST
DL/VA % pred: 147 %
DL/VA: 5.76 ml/min/mmHg/L
DLCO unc % pred: 78 %
DLCO unc: 12.77 ml/min/mmHg
FEF 25-75 Post: 1.18 L/sec
FEF 25-75 Pre: 0.36 L/sec
FEF2575-%Change-Post: 231 %
FEF2575-%Pred-Post: 77 %
FEF2575-%Pred-Pre: 23 %
FEV1-%Change-Post: 38 %
FEV1-%Pred-Post: 40 %
FEV1-%Pred-Pre: 29 %
FEV1-Post: 0.7 L
FEV1-Pre: 0.5 L
FEV1FVC-%Change-Post: -1 %
FEV1FVC-%Pred-Pre: 103 %
FEV6-%Change-Post: 41 %
FEV6-%Pred-Post: 41 %
FEV6-%Pred-Pre: 29 %
FEV6-Post: 0.9 L
FEV6-Pre: 0.64 L
FEV6FVC-%Pred-Post: 105 %
FEV6FVC-%Pred-Pre: 105 %
FVC-%Change-Post: 41 %
FVC-%Pred-Post: 39 %
FVC-%Pred-Pre: 28 %
FVC-Post: 0.9 L
FVC-Pre: 0.64 L
Post FEV1/FVC ratio: 77 %
Post FEV6/FVC ratio: 100 %
Pre FEV1/FVC ratio: 78 %
Pre FEV6/FVC Ratio: 100 %
RV % pred: 109 %
RV: 2.09 L
TLC % pred: 80 %
TLC: 3.34 L

## 2018-02-08 MED ORDER — UMECLIDINIUM BROMIDE 62.5 MCG/INH IN AEPB: 1.0000 | INHALATION_SPRAY | Freq: Every day | RESPIRATORY_TRACT | 0 refills | Status: DC

## 2018-02-08 MED ORDER — FLUTICASONE-UMECLIDIN-VILANT 100-62.5-25 MCG/INH IN AEPB: 1.0000 | INHALATION_SPRAY | RESPIRATORY_TRACT | 11 refills | Status: DC

## 2018-02-08 MED ORDER — PREDNISONE 10 MG PO TABS: ORAL_TABLET | ORAL | 0 refills | Status: DC

## 2018-02-08 NOTE — Progress Notes (Signed)
PFT completed 02/08/18  

## 2018-02-08 NOTE — Progress Notes (Signed)
Subjective:   Patient ID: [name], female    DOB: 05-19-1946    MRN: [id number]   Brief patient profile:  71 yowf quit smoking in May 2006 with dx pna/ ards resumed full activity including yardwork at wt 140 and pft's c/w restrictive changes 04/2010 at wt 170 but reported improvement on saba so maintained chronically on symbicort 160 despite no sign airflow obst on pfts which was confirmed  06/04/15    History of Present Illness  July 18, 2010  1st pulmonary office eval in ER era c/o doe x 6 months progressive indolent onset with copd vs pf.  Ultimately required hosp 8/30-9/1 dx copd/pf ? cause on sulfasalzine and ACE inhib better on 02 and advair but very hoarse with sense of chest congestion and cough with white mucus day > night.  doe x > slow adls.stop advair Start symbicort 160 2 puffs first thing  in am and 2 puffs again in pm about 12 hours later  Work on inhaler technique:   stop benazapril start benicar 40/25  one daily Use 02 sleeping and any activity other than sitting still   04/10/2014 f/u ov/[name] re: restrictive lung dz/ RA/psoriatric/ symbicort 160 2bid  Chief Complaint  Patient presents with  . Follow-up    Pt states was advised to f/u per [employer].  Pt has been having increased SOB- relates to stress from dealing with her spouse's illness. She is using o2 pretty much 24/7.   trying to do more than baseline activity and finds she needs 02 more. rec Work on inhaler technique:   Ok to just use the symbicort 160 2 puffs in am to see what difference if any this makes in your breathing  Change 02 to 2lpm 24/7 and use 3lpm with activity    06/04/15  cabg     07/22/2016  f/u ov/[name] re: PF sp ali/ AB/ symbicort 160 2bid on 02 2lpm at rest/ 3lpm with activity  Chief Complaint  Patient presents with  . Follow-up    Breathing is "not as good as it could be".  Breathing has been worse for the past 2 months. She gets winded just making the bed. She is also c/o prod cough with  yellow sputum and occ wheezing.   sob gradually worse x 2 m already tried on abx/pred per PC no better, using omeprazole 20mg  9am/ does not have a rescue inhaler at all  rec Prednisone 10 mg take  4 each am x 2 days,   2 each am x 2 days,  1 each am x 2 days and stop  Plan A = Automatic = Symbicort 160 Take 2 puffs first thing in am and then another 2 puffs about 12 hours later.  Work on inhaler technique:  Plan B = Backup Only use your albuterol(proair)  as a rescue medication Pantoprazole (protonix) 40 mg (same as omeprzole 20 mg x2)   Take  30-60 min before first meal of the day and Pepcid (famotidine)  20 mg one @  bedtime until return to office - this is the best way to tell whether stomach acid is contributing to your problem.   GERD (REFLUX)  is an extremely common  Please see patient coordinator before you leave today  to schedule portable 02 evaluation Please remember to go to the  x-ray department downstairs for your tests - we will call you with the results when they are available.   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 Follow-up    Breathing has improved some. She is coughing less, but still coughing up beige sputum.  She has not used albuterol inhaler.   cough assoc with variable nasal stuffiness / much better with pred  Worse with laughing / not typically noct but some at hs  pred helps the cough but not the doe  rec Plan A = Automatic = symicort 160 Take 2 puffs first thing in am and then another 2 puffs about 12 hours later.                                      Add singulair 10 mg one each pm Plan B = Backup Only use your albuterol as a rescue medication   10/29/2016  f/u ov/[name] re:  Cough variant asthma on symb 160 2bid and singulair  02 2lpm hs and prn daytime  Chief Complaint  Patient presents with  . Follow-up    Breathing is  overall doing well. She has occ wheezing. She rarely uses albuterol inhaler.   rarely  Needs  albuerol   Using 02 with activity 2lpm and sats ok up to 3lpm rarely needed now [facility] = can't walk a nl pace on a flat grade s sob but does fine slow and flat eg slow shopping always on 02 and planning mall walking    rec Plan A = Automatic = symicort 160 Take 2 puffs first thing in am and then another 2 puffs about 12 hours later.                                      Add singulair 10 mg one each pm Plan B = Backup Only use your albuterol as a rescue medication      04/29/2017  f/u ov/[name] re: cough variant asthma/symb 160 and singulair  Chief Complaint  Patient presents with  . Follow-up    Increased cough for the past wk- prod with yellow sputum.  She states she "always has wheezing". She has not used her albuterol inhaler.   Marland KitchenMMRC2 = can't walk a nl pace on a flat grade s sob but does fine slow and flat eg shopping ok on 2lpm pulsed but does not check sats Sleeps fine on 2lpm 30 degrees with books under mattresss > elevation about 10-20degrees and sleeps ok  rec No change in medications Always wear the 02 at least 2lpm when you walk and turn it up if you need to maintain over 90% Please see patient coordinator before you leave today  to schedule pulmonary rehab Please schedule a follow up visit in 6 months but call sooner if needed     11/10/2017  f/u ov/[name] re: AB / pf related to ALI vs RA / MO  Chief Complaint  Patient presents with  . Follow-up    Breathing has been worse over the past 1-2 months. She states she gets winded just walking from her house to her car. She has been using her albuterol inhaler 4 x daily on average.   on 2lpm at rest and sleeping needed up to 4lpm continous while doing rehab  but only has pulsed to 3lpm  at home per [employer] Much better only while on prednisone / poor hfa noted  Doe = MMRC3 = can't walk 100 yards even at a slow pace at a flat grade s stopping due  to sob  Even on 02 but not sure about sats as does not check rec Goal is to keep sats above 90%  - call if you want [employer] to re-evaluate your ambulatory needs  Prednisone 10 mg take  4 each am x 2 days,   2 each am x 2 days,  1 each am x 2 days and stop  Work on inhaler technique:     02/08/2018  f/u ov/[name] re:  Copd/ PF s/p ards vs RA on injections two shots once a month [place]  Chief Complaint  Patient presents with  . Follow-up    PFT's done today. Breathing is about the same since her last visit.   Dyspnea:  Can't do HT s stopping even on 3lpm continuous  Cough: none at present  Sleep: no resp issues 2lpm conc/ 2-3 pillows  SABA use:  Twice a week when she "overdoes it"  No obvious day to day or daytime variability or assoc excess/ purulent sputum or mucus plugs or hemoptysis or cp or chest tightness, subjective wheeze or overt sinus or hb symptoms. No unusual exposure hx or h/o childhood pna/ asthma or knowledge of premature birth.  Sleeping ok flat without nocturnal  or early am exacerbation  of respiratory  c/o's or need for noct saba. Also denies any obvious fluctuation of symptoms with weather or environmental changes or other aggravating or alleviating factors except as outlined above   Current Allergies, Complete Past Medical History, Past Surgical History, Family History, and Social History were reviewed in [employer] record.  ROS  The following are not active complaints unless bolded Hoarseness, sore throat, dysphagia, dental problems, itching, sneezing,  nasal congestion or discharge of excess mucus or purulent secretions, ear ache,   fever, chills, sweats, unintended wt loss or wt gain, classically pleuritic or exertional cp,  orthopnea pnd or leg swelling, presyncope, palpitations, abdominal pain, anorexia, nausea, vomiting, diarrhea  or change in bowel habits or change in bladder habits, change in stools or change in urine, dysuria, hematuria,  rash,  arthralgias, visual complaints, headache, numbness, weakness or ataxia or problems with walking or coordination,  change in mood/affect or memory.        Current Meds  Medication Sig  . acetaminophen (TYLENOL ARTHRITIS PAIN) 650 MG CR tablet Take 1,300 mg by mouth every 8 (eight) hours as needed for pain. Per bottle directions as needed  . albuterol (PROAIR HFA) 108 (90 Base) MCG/ACT inhaler 2 puffs every 4 hours as needed only  if your can't catch your breath  . aspirin EC 81 MG tablet Take 1 tablet (81 mg total) by mouth daily.  Marland Kitchen atorvastatin (LIPITOR) 80 MG tablet TAKE 1 TABLET BY MOUTH  DAILY AT 6 PM.  . budesonide-formoterol (SYMBICORT) 160-4.5 MCG/ACT inhaler Inhale 2 puffs into the lungs 2 (two) times daily.  . Certolizumab Pegol (CIMZIA [place]) Inject into the skin as directed. Cimzia, Injected by provider as directed  . Cholecalciferol (VITAMIN D3) 2000 UNITS TABS Take 2,000 Int'l Units by mouth daily.  . citalopram (CELEXA) 40 MG tablet TAKE 1 TABLET BY MOUTH  EVERY MORNING  . famotidine (PEPCID) 20 MG tablet One at bedtime  . furosemide (LASIX) 20 MG tablet Take 1 tablet (20 mg total) by mouth 2 (two)  times daily.  Marland Kitchen levothyroxine (SYNTHROID, LEVOTHROID) 150 MCG tablet TAKE 1 TABLET BY MOUTH  DAILY BEFORE BREAKFAST  . losartan (COZAAR) 50 MG tablet Take 1 tablet (50 mg total) by mouth daily.  . montelukast (SINGULAIR) 10 MG tablet TAKE 1 TABLET BY MOUTH AT  BEDTIME  . NON FORMULARY 2lpm with sleep and with exertion if needed  . pantoprazole (PROTONIX) 40 MG tablet TAKE 1 TABLET BY MOUTH  DAILY 30 TO 60 MINUTES  BEFORE FIRST MEAL OF THE  DAY  . traMADol (ULTRAM) 50 MG tablet TAKE 1-2 TABLETS EVERY 6 HOURS AS NEEDED FOR PAIN  . triamcinolone cream (KENALOG) 0.1 % Apply 1 application topically 2 (two) times daily as needed (for psorisis).                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       - CT chest 07/03/10 Nonspecific PF mostly upper lobes       - CT chest 12/03/10 acute gg changes and effusions c/w chf        - PFT's  04/12/10 FEV1  1.21 (69%) ratio 77 and no change p B2,  DLC0 56% Asthmatic bronchitis     - HFA 50% p coaching July 18, 2010 >>  90%  02/25/2011  history of mild vitamin D deficiency        Objective:   Physical Exam  amb obese wf nad  Vital signs reviewed - Note on arrival 02 sats  96% on 2lpm pulsed       wt 192 July 18, 2010 > 197 August 21, 2010 > 207 December 05, 2010  > 183  01/21/2011  > 179 02/25/2011 >189 08/10/2012 >195 09/08/12 >200 12/27/12 > 202 05/03/2013 > 06/28/2013  199 > 08/08/13 194 > 04/10/2014  203 > 04/19/2015   176 >  07/23/2015 166 > 09/16/2016  210 >  10/29/2016  209 > 04/29/2017    206 > 11/10/2017   209 > 02/08/2018  207   02/08/2018   - top dentures  late bilateral mid  rhonchi    HEENT: nl  oropharynx. Nl external ear canals without cough reflex - moderate bilateral non-specific turbinate edema  / top dentures    NECK :  without JVD/Nodes/TM/ nl carotid upstrokes bilaterally   LUNGS: no acc muscle use,  Mild barrel  contour chest wall with bilateral  Distant bs with mid exp rhonchi and  without cough on insp or exp maneuver and mild   Hyperresonant  to  percussion bilaterally     CV:  RRR  no s3 or murmur or increase in P2, and no edema   ABD:  Quite obese but soft and nontender with pos mid insp Hoover's  in the supine position. No bruits or organomegaly appreciated, bowel sounds nl  MS:   Nl gait/  ext warm without deformities, calf tenderness, cyanosis or clubbing No obvious joint restrictions   SKIN: warm and dry without lesions    NEURO:   alert, approp, nl sensorium with  no motor or cerebellar deficits apparent.  I personally reviewed images and agree with radiology impression as follows:  CXR:   09/16/17 Stable mild chronic interstitial lung disease. No active cardiopulmonary disease.

## 2018-02-08 NOTE — Patient Instructions (Addendum)
Prednisone 10 mg take  4 each am x 2 days,   2 each am x 2 days,  1 each am x 2 days and stop   Stop symbicort /Incruse  and start trelegy one click each am  Only use your albuterol as a rescue medication to be used if you can't catch your breath by resting or doing a relaxed purse lip breathing pattern.  - The less you use it, the better it will work when you need it. - Ok to use up to 2 puffs  every 4 hours if you must but call for immediate appointment if use goes up over your usual need - Don't leave home without it !!  (think of it like the spare tire for your car)   Please schedule a follow up office visit in 6 weeks, call sooner if needed

## 2018-02-09 ENCOUNTER — Encounter: Payer: Self-pay | Admitting: Internal Medicine

## 2018-02-09 NOTE — Assessment & Plan Note (Addendum)
Quit smoking May 2006 - PFT's  02/08/2018  FEV1 0.70 (40 % ) ratio 56 if use FEV1/VC  p 38 % improvement from saba p symb 160 prior to study with DLCO  78 % corrects to 147  % for alv volume   - 02/08/2018  After extensive coaching inhaler device  effectiveness =    75% with mdi and 90% with dpi so change to Trelegy    Group D in terms of symptom/risk and laba/lama/ICS  therefore appropriate rx at this point and does better with dpi than hfa so trelegy trial good choice if insurance covers  I had an extended discussion with the patient reviewing all relevant studies completed to date and  lasting 15 to 20 minutes of a 25 minute visit on the following ongoing concerns:   Formulary restrictions will be an ongoing challenge for the forseable future and I would be happy to pick an alternative if the pt will first  provide me a list of them but pt  will need to return here for training for any new device that is required eg dpi vs hfa vs respimat.    In meantime we can always provide samples so the patient never runs out of any needed respiratory medications.   She is having mild flare at present > rx pred x 6 days only (can wait and try the triple combo first and keep the pred in reserve if prefers)  F/u in 6 weeks

## 2018-02-09 NOTE — Assessment & Plan Note (Signed)
-   02 dependent  since 07/02/10 >>  83% RA December 05, 2010       - ONO RA 08/05/12  :  Positive sat < 89 x 2:75m> repeat on 2lpm rec 08/12/2012  - 06/17/2013 reported desat with activity p Knee surgery > rec restart 2lpm with activity  - 06/27/2013   Walked 2lpm  x one lap @ 185 stopped due to sat 88% not sob , desat to 82% on RA just at the  Start of the walk  - 04/10/14  Sats  81% RA and then  Walked 3lpm x  2 laps @ 185 ft each stopped due to end of study, no desat  - HCO3  11/09/17  = 31   02/08/2018    rec 2lpm at rest/ 3lpm with activity /POC> titrate up to keep > 90%

## 2018-02-09 NOTE — Assessment & Plan Note (Signed)
pfts with erv 14% 06/04/15  And 33% 02/08/2018 c/w effects of obesity  Body mass index is 43.26 kg/m.  -  trending slt down, encouraged  Lab Results  Component Value Date   TSH 0.450 11/09/2017     Contributing to gerd risk/ doe/reviewed the need and the process to achieve and maintain neg calorie balance > defer f/u primary care including intermittently monitoring thyroid status

## 2018-02-09 NOTE — Assessment & Plan Note (Signed)
-  s/p ARDS 2006 with bacteremic S  Pna       - CT chest 07/03/10 Nonspecific PF mostly upper lobes       - CT chest 12/03/10 acute gg changes and effusions c/w chf - PFT's  02/08/2018  FVC 0.64 (28 %)   with DLCO  78 % corrects to 147 % for alv volume    Suspect most of her recent problems ar more COPD/AB than PF based on dlco - see sep a/p

## 2018-02-25 DIAGNOSIS — M0589 Other rheumatoid arthritis with rheumatoid factor of multiple sites: Secondary | ICD-10-CM | POA: Diagnosis not present

## 2018-02-26 DIAGNOSIS — Z1231 Encounter for screening mammogram for malignant neoplasm of breast: Secondary | ICD-10-CM | POA: Diagnosis not present

## 2018-02-26 DIAGNOSIS — Z803 Family history of malignant neoplasm of breast: Secondary | ICD-10-CM | POA: Diagnosis not present

## 2018-02-26 LAB — HM MAMMOGRAPHY: HM Mammogram: NORMAL (ref 0–4)

## 2018-03-01 ENCOUNTER — Telehealth: Payer: Self-pay | Admitting: Internal Medicine

## 2018-03-01 NOTE — Telephone Encounter (Signed)
Called and spoke with Jeannie Done from [facility]. She is sending the form over to fill out and fax back. Will await form. Routing to [place] to follow up on.

## 2018-03-01 NOTE — Telephone Encounter (Signed)
Called and spoke with patient, she advised this was a mistake she does not need a refill at this time.

## 2018-03-03 ENCOUNTER — Encounter: Payer: Self-pay | Admitting: Internal Medicine

## 2018-03-04 NOTE — Telephone Encounter (Signed)
[name], [facility], calling to speak with a nurse for verbal order for mediation Trelegy. Cb is [phone] Ref Number [id number].

## 2018-03-04 NOTE — Telephone Encounter (Signed)
Spoke with an [facility] rep, advised her that the medication is currently pending on a PA. She verbalized understanding.   Attempted to find PA in [email or web address] with the information below, PA could not be found. Will try to find PA later.

## 2018-03-04 NOTE — Telephone Encounter (Signed)
Initiated PA via [email or web address]  Key: [id number] PA has been sent to plan, and a determination is expected within 72 hours. Will hold this in triage for follow up as this is a triage generated message.

## 2018-03-05 MED ORDER — FLUTICASONE-UMECLIDIN-VILANT 100-62.5-25 MCG/INH IN AEPB: 1.0000 | INHALATION_SPRAY | RESPIRATORY_TRACT | 3 refills | Status: DC

## 2018-03-05 NOTE — Telephone Encounter (Signed)
Spoke with pharmacy tech at [facility], states that they need a new rx for Trelegy.  This has been sent.  Will close encounter.

## 2018-03-05 NOTE — Telephone Encounter (Signed)
Jae Dire from [facility] is calling back [phone] Ref# [id number]

## 2018-03-12 ENCOUNTER — Other Ambulatory Visit: Payer: Self-pay | Admitting: Internal Medicine

## 2018-03-12 MED ORDER — FLUTICASONE-UMECLIDIN-VILANT 100-62.5-25 MCG/INH IN AEPB: 1.0000 | INHALATION_SPRAY | RESPIRATORY_TRACT | 3 refills | Status: DC

## 2018-03-24 ENCOUNTER — Ambulatory Visit (INDEPENDENT_AMBULATORY_CARE_PROVIDER_SITE_OTHER): Payer: Medicare Other | Admitting: Internal Medicine

## 2018-03-24 ENCOUNTER — Encounter: Payer: Self-pay | Admitting: Internal Medicine

## 2018-03-24 VITALS — BP 132/74 | HR 82 | Ht <= 58 in | Wt 209.0 lb

## 2018-03-24 DIAGNOSIS — J9611 Chronic respiratory failure with hypoxia: Secondary | ICD-10-CM | POA: Diagnosis not present

## 2018-03-24 DIAGNOSIS — J449 Chronic obstructive pulmonary disease, unspecified: Secondary | ICD-10-CM

## 2018-03-24 DIAGNOSIS — J9612 Chronic respiratory failure with hypercapnia: Secondary | ICD-10-CM | POA: Diagnosis not present

## 2018-03-24 DIAGNOSIS — I2510 Atherosclerotic heart disease of native coronary artery without angina pectoris: Secondary | ICD-10-CM

## 2018-03-24 NOTE — Progress Notes (Signed)
Subjective:   Patient ID: [name], female    DOB: 05-19-1946    MRN: [id number]   Brief patient profile:  71 yowf quit smoking in May 2006 with dx pna/ ards resumed full activity including yardwork at wt 140 and pft's c/w restrictive changes 04/2010 at wt 170 but reported improvement on saba so maintained chronically on symbicort 160 despite no sign airflow obst on pfts which was confirmed  06/04/15    History of Present Illness  July 18, 2010  1st pulmonary office eval in ER era c/o doe x 6 months progressive indolent onset with copd vs pf.  Ultimately required hosp 8/30-9/1 dx copd/pf ? cause on sulfasalzine and ACE inhib better on 02 and advair but very hoarse with sense of chest congestion and cough with white mucus day > night.  doe x > slow adls.stop advair Start symbicort 160 2 puffs first thing  in am and 2 puffs again in pm about 12 hours later  Work on inhaler technique:   stop benazapril start benicar 40/25  one daily Use 02 sleeping and any activity other than sitting still   04/10/2014 f/u ov/[name] re: restrictive lung dz/ RA/psoriatric/ symbicort 160 2bid  Chief Complaint  Patient presents with  . Follow-up    Pt states was advised to f/u per [employer].  Pt has been having increased SOB- relates to stress from dealing with her spouse's illness. She is using o2 pretty much 24/7.   trying to do more than baseline activity and finds she needs 02 more. rec Work on inhaler technique:   Ok to just use the symbicort 160 2 puffs in am to see what difference if any this makes in your breathing  Change 02 to 2lpm 24/7 and use 3lpm with activity    06/04/15  cabg     07/22/2016  f/u ov/[name] re: PF sp ali/ AB/ symbicort 160 2bid on 02 2lpm at rest/ 3lpm with activity  Chief Complaint  Patient presents with  . Follow-up    Breathing is "not as good as it could be".  Breathing has been worse for the past 2 months. She gets winded just making the bed. She is also c/o prod cough with  yellow sputum and occ wheezing.   sob gradually worse x 2 m already tried on abx/pred per PC no better, using omeprazole 20mg  9am/ does not have a rescue inhaler at all  rec Prednisone 10 mg take  4 each am x 2 days,   2 each am x 2 days,  1 each am x 2 days and stop  Plan A = Automatic = Symbicort 160 Take 2 puffs first thing in am and then another 2 puffs about 12 hours later.  Work on inhaler technique:  Plan B = Backup Only use your albuterol(proair)  as a rescue medication Pantoprazole (protonix) 40 mg (same as omeprzole 20 mg x2)   Take  30-60 min before first meal of the day and Pepcid (famotidine)  20 mg one @  bedtime until return to office - this is the best way to tell whether stomach acid is contributing to your problem.   GERD (REFLUX)  is an extremely common  Please see patient coordinator before you leave today  to schedule portable 02 evaluation Please remember to go to the  x-ray department downstairs for your tests - we will call you with the results when they are available.   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 Follow-up    Breathing has improved some. She is coughing less, but still coughing up beige sputum.  She has not used albuterol inhaler.   cough assoc with variable nasal stuffiness / much better with pred  Worse with laughing / not typically noct but some at hs  pred helps the cough but not the doe  rec Plan A = Automatic = symicort 160 Take 2 puffs first thing in am and then another 2 puffs about 12 hours later.                                      Add singulair 10 mg one each pm Plan B = Backup Only use your albuterol as a rescue medication   10/29/2016  f/u ov/[name] re:  Cough variant asthma on symb 160 2bid and singulair  02 2lpm hs and prn daytime  Chief Complaint  Patient presents with  . Follow-up    Breathing is  overall doing well. She has occ wheezing. She rarely uses albuterol inhaler.   rarely  Needs  albuerol   Using 02 with activity 2lpm and sats ok up to 3lpm rarely needed now [facility] = can't walk a nl pace on a flat grade s sob but does fine slow and flat eg slow shopping always on 02 and planning mall walking    rec Plan A = Automatic = symicort 160 Take 2 puffs first thing in am and then another 2 puffs about 12 hours later.                                      Add singulair 10 mg one each pm Plan B = Backup Only use your albuterol as a rescue medication      04/29/2017  f/u ov/[name] re: cough variant asthma/symb 160 and singulair  Chief Complaint  Patient presents with  . Follow-up    Increased cough for the past wk- prod with yellow sputum.  She states she "always has wheezing". She has not used her albuterol inhaler.   Marland KitchenMMRC2 = can't walk a nl pace on a flat grade s sob but does fine slow and flat eg shopping ok on 2lpm pulsed but does not check sats Sleeps fine on 2lpm 30 degrees with books under mattresss > elevation about 10-20degrees and sleeps ok  rec No change in medications Always wear the 02 at least 2lpm when you walk and turn it up if you need to maintain over 90% Please see patient coordinator before you leave today  to schedule pulmonary rehab Please schedule a follow up visit in 6 months but call sooner if needed     11/10/2017  f/u ov/[name] re: AB / pf related to ALI vs RA / MO  Chief Complaint  Patient presents with  . Follow-up    Breathing has been worse over the past 1-2 months. She states she gets winded just walking from her house to her car. She has been using her albuterol inhaler 4 x daily on average.   on 2lpm at rest and sleeping needed up to 4lpm continous while doing rehab  but only has pulsed to 3lpm  at home per [employer] Much better only while on prednisone / poor hfa noted  Doe = MMRC3 = can't walk 100 yards even at a slow pace at a flat grade s stopping due  to sob  Even on 02 but not sure about sats as does not check rec Goal is to keep sats above 90%  - call if you want [employer] to re-evaluate your ambulatory needs  Prednisone 10 mg take  4 each am x 2 days,   2 each am x 2 days,  1 each am x 2 days and stop  Work on inhaler technique:     02/08/2018  f/u ov/[name] re:  Copd/ PF s/p ards vs RA on injections two shots once a month [place]  Chief Complaint  Patient presents with  . Follow-up    PFT's done today. Breathing is about the same since her last visit.   Dyspnea:  Can't do HT s stopping even on 3lpm continuous  Cough: none at present  Sleep: no resp issues 2lpm conc/ 2-3 pillows  SABA use:  Twice a week when she "overdoes it" rec Prednisone 10 mg take  4 each am x 2 days,   2 each am x 2 days,  1 each am x 2 days and stop  Stop symbicort/incruse   and start trelegy one click each am Only use your albuterol as a rescue medication   03/24/2018  f/u ov/[name] re:  Copd/pf  GOLD III no change on trelegy vs symb alone / 2lpm 24/7 unless long walk = 3lpm  Chief Complaint  Patient presents with  . Follow-up    Breathing is overall doing well. She is using her albuterol inhaler 2 x per wk on average.    Dyspnea:  Now able to do HT on 3lpm  Cough: no Sleep: 30 degrees propped up on 2lpm  SABA use:  Rarely      No obvious day to day or daytime variability or assoc excess/ purulent sputum or mucus plugs or hemoptysis or cp or chest tightness, subjective wheeze or overt sinus or hb symptoms. No unusual exposure hx or h/o childhood pna/ asthma or knowledge of premature birth.  Sleeping  Ok at 30 degrees and 2lpm   without nocturnal  or early am exacerbation  of respiratory  c/o's or need for noct saba. Also denies any obvious fluctuation of symptoms with weather or environmental changes or other aggravating or alleviating factors except as outlined above   Current Allergies, Complete Past Medical History, Past Surgical History, Family History,  and Social History were reviewed in [employer] record.  ROS  The following are not active complaints unless bolded Hoarseness, sore throat, dysphagia, dental problems, itching, sneezing,  nasal congestion or discharge of excess mucus or purulent secretions, ear ache,   fever, chills, sweats, unintended wt loss or wt gain, classically pleuritic or exertional cp,  orthopnea pnd or arm/hand swelling  or leg swelling, presyncope, palpitations, abdominal pain, anorexia, nausea, vomiting, diarrhea  or change in bowel habits or change in bladder habits, change in stools or change in urine, dysuria, hematuria,  rash, arthralgias, visual complaints, headache, numbness, weakness or ataxia or problems with walking or coordination,  change in mood or  memory.        Current Meds  Medication Sig  . acetaminophen (TYLENOL ARTHRITIS PAIN) 650 MG CR tablet Take 1,300 mg by mouth every 8 (eight) hours as needed for pain. Per bottle directions as needed  .  albuterol (PROAIR HFA) 108 (90 Base) MCG/ACT inhaler 2 puffs every 4 hours as needed only  if your can't catch your breath  . aspirin EC 81 MG tablet Take 1 tablet (81 mg total) by mouth daily.  Marland Kitchen atorvastatin (LIPITOR) 80 MG tablet TAKE 1 TABLET BY MOUTH  DAILY AT 6 PM.  . budesonide-formoterol (SYMBICORT) 160-4.5 MCG/ACT inhaler Inhale 2 puffs into the lungs 2 (two) times daily.  . Certolizumab Pegol (CIMZIA [place]) Inject into the skin as directed. Cimzia, Injected by provider as directed  . Cholecalciferol (VITAMIN D3) 2000 UNITS TABS Take 2,000 Int'l Units by mouth daily.  . citalopram (CELEXA) 40 MG tablet TAKE 1 TABLET BY MOUTH  EVERY MORNING  . famotidine (PEPCID) 20 MG tablet One at bedtime  . furosemide (LASIX) 20 MG tablet Take 1 tablet (20 mg total) by mouth 2 (two) times daily.  Marland Kitchen levothyroxine (SYNTHROID, LEVOTHROID) 150 MCG tablet TAKE 1 TABLET BY MOUTH  DAILY BEFORE BREAKFAST  . losartan (COZAAR) 50 MG tablet Take 1 tablet (50  mg total) by mouth daily.  . montelukast (SINGULAIR) 10 MG tablet TAKE 1 TABLET BY MOUTH AT  BEDTIME  . NON FORMULARY 2lpm 24/7  . pantoprazole (PROTONIX) 40 MG tablet TAKE 1 TABLET BY MOUTH  DAILY 30 TO 60 MINUTES  BEFORE FIRST MEAL OF THE  DAY  . traMADol (ULTRAM) 50 MG tablet TAKE 1-2 TABLETS EVERY 6 HOURS AS NEEDED FOR PAIN  . triamcinolone cream (KENALOG) 0.1 % Apply 1 application topically 2 (two) times daily as needed (for psorisis).          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       - CT chest 07/03/10 Nonspecific PF mostly upper lobes       - CT chest 12/03/10 acute gg changes and effusions c/w chf        - PFT's  04/12/10 FEV1  1.21 (69%) ratio 77 and no change p B2,  DLC0 56% Asthmatic bronchitis     - HFA 50% p coaching July 18, 2010 >>  90%  02/25/2011  history of mild vitamin D deficiency        Objective:   Physical Exam     amb wf nad  Vital signs reviewed - Note on arrival 02 sats  95% on 2lpm   Pulsed      wt 192 July 18, 2010 > 197 August 21, 2010 > 207 December 05, 2010  > 183  01/21/2011  > 179 02/25/2011 >189 08/10/2012 >195 09/08/12 >200 12/27/12 > 202 05/03/2013 > 06/28/2013  199 > 08/08/13 194 > 04/10/2014  203 > 04/19/2015   176 >  07/23/2015 166 > 09/16/2016  210 >  10/29/2016  209 > 04/29/2017    206 > 11/10/2017   209 > 02/08/2018  207 > 03/24/2018 209        top dentures      HEENT: top dentures/ nl oropharynx. Nl external ear canals without cough reflex - moderate bilateral non-specific turbinate edema     NECK :  without JVD/Nodes/TM/ nl carotid upstrokes bilaterally   LUNGS: no  acc  muscle use,  Mod barrel  contour chest wall with bilateral  Distant bs s audible wheeze and  without cough on insp or exp maneuver and mod   Hyperresonant  to  percussion bilaterally     CV:  RRR  no s3 or murmur or increase in P2, and no edema   ABD:  soft and nontender with pos mid insp Hoover's  in the supine position. No bruits or organomegaly appreciated, bowel sounds nl  MS:   Nl gait/  ext warm without deformities, calf tenderness, cyanosis or clubbing No obvious joint restrictions   SKIN: warm and dry without lesions    NEURO:  alert, approp, nl sensorium with  no motor or cerebellar deficits apparent.

## 2018-03-24 NOTE — Patient Instructions (Signed)
No change in medications    Please schedule a follow up visit in 6  months but call sooner if needed  

## 2018-03-24 NOTE — Assessment & Plan Note (Signed)
Quit smoking May 2006       - PFT's  04/12/10 FEV1  1.21 (69%) ratio 77 and no change p B2,  DLC0 56%   VC 70%         - PFTs  08/08/2013 FEV1 1.21 (60%) ratio 86 and no change p B2 DLCO 79%  VC 72%  On symbicort 160 2bid  - PFT's  02/08/2018  FEV1 0.70 (40 % ) ratio 56 if use FEV1/VC  p 38 % improvement from saba p symb 160 prior to study with DLCO  78 % corrects to 147  % for alv volume   - 02/08/2018  After extensive coaching inhaler device  effectiveness =    75% with mdi and 90% with dpi so change to Trelegy > no change from sample so rec resume symb 160 2bdi as of 03/24/2018   Emphasize rx is directed at symptoms, not specific FEV1 and if symptoms and need for saba are the same then no reason to change symb to trelegy as no advantage over exac rates and the latter is not covered on her plan anyway   F/u can be q 6 m, sooner prn   Each maintenance medication was reviewed in detail including most importantly the difference between maintenance and as needed and under what circumstances the prns are to be used.  Please see AVS for specific  Instructions which are unique to this visit and I personally typed out  which were reviewed in detail in writing with the patient and a copy provided.

## 2018-03-24 NOTE — Assessment & Plan Note (Signed)
-   02 dependent  since 07/02/10 >>  83% RA December 05, 2010       - ONO RA 08/05/12  :  Positive sat < 89 x 2:37m> repeat on 2lpm rec 08/12/2012  - 06/17/2013 reported desat with activity p Knee surgery > rec restart 2lpm with activity  - 06/27/2013   Walked 2lpm  x one lap @ 185 stopped due to sat 88% not sob , desat to 82% on RA just at the  Start of the walk  - 04/10/14  Sats  81% RA and then  Walked 3lpm x  2 laps @ 185 ft each stopped due to end of study, no desat  - HCO3  11/09/17  = 31   03/24/2018    rec 2lpm at rest/ 3lpm with activity /POC> titrate up to keep > 90%

## 2018-03-27 ENCOUNTER — Other Ambulatory Visit: Payer: Self-pay | Admitting: Cardiology

## 2018-03-27 ENCOUNTER — Other Ambulatory Visit: Payer: Self-pay | Admitting: Internal Medicine

## 2018-03-27 ENCOUNTER — Other Ambulatory Visit: Payer: Self-pay | Admitting: Pulmonary Disease

## 2018-03-30 NOTE — Telephone Encounter (Signed)
Patient due for appt. Called patient and scheduled follow-up.

## 2018-04-01 ENCOUNTER — Other Ambulatory Visit: Payer: Self-pay | Admitting: Internal Medicine

## 2018-04-01 ENCOUNTER — Other Ambulatory Visit: Payer: Self-pay | Admitting: Cardiology

## 2018-04-01 NOTE — Telephone Encounter (Signed)
Yes, please refill. Thanks!

## 2018-04-05 ENCOUNTER — Other Ambulatory Visit: Payer: Self-pay | Admitting: Internal Medicine

## 2018-04-05 ENCOUNTER — Telehealth: Payer: Self-pay | Admitting: Internal Medicine

## 2018-04-05 DIAGNOSIS — M0589 Other rheumatoid arthritis with rheumatoid factor of multiple sites: Secondary | ICD-10-CM | POA: Diagnosis not present

## 2018-04-05 MED ORDER — PREDNISONE 10 MG PO TABS: ORAL_TABLET | ORAL | 0 refills | Status: DC

## 2018-04-05 NOTE — Telephone Encounter (Signed)
Prednisone 10 mg take  4 each am x 2 days,   2 each am x 2 days,  1 each am x 2 days and stop and then f/u ov in 2 weeks

## 2018-04-05 NOTE — Telephone Encounter (Signed)
Prednisone prescription sent to preferred pharmacy.  LMTCB for Patient.  Will follow up with Patient to schedule  2 week f/u OV.

## 2018-04-05 NOTE — Telephone Encounter (Signed)
Called and spoke with patient she states for the last 4 days she has had congestion, a productive cough with clear mucus. She complains of post nasal drip, wheezing both at night and at rest. Denies chest pain, fever or body aches.  Patient is wanting to know what she can do.   MW please advise thank you.

## 2018-04-05 NOTE — Telephone Encounter (Signed)
Called patient, unable to reach left message to give us a call back. 

## 2018-04-06 NOTE — Telephone Encounter (Signed)
LMTCB on mobile number for her to schedule appointment with MW., home number has busy signal.  Will try again later today.

## 2018-04-06 NOTE — Telephone Encounter (Signed)
Spoke with pt, she is coming in to follow up on 04/23/18 Friday at 1:30. Dr. Sherene Sires

## 2018-04-23 ENCOUNTER — Ambulatory Visit (INDEPENDENT_AMBULATORY_CARE_PROVIDER_SITE_OTHER): Payer: Medicare Other | Admitting: Internal Medicine

## 2018-04-23 ENCOUNTER — Encounter: Payer: Self-pay | Admitting: Internal Medicine

## 2018-04-23 VITALS — BP 118/66 | HR 91 | Ht <= 58 in | Wt 208.6 lb

## 2018-04-23 DIAGNOSIS — J9611 Chronic respiratory failure with hypoxia: Secondary | ICD-10-CM

## 2018-04-23 DIAGNOSIS — I2510 Atherosclerotic heart disease of native coronary artery without angina pectoris: Secondary | ICD-10-CM | POA: Diagnosis not present

## 2018-04-23 DIAGNOSIS — J449 Chronic obstructive pulmonary disease, unspecified: Secondary | ICD-10-CM

## 2018-04-23 DIAGNOSIS — J9612 Chronic respiratory failure with hypercapnia: Secondary | ICD-10-CM

## 2018-04-23 NOTE — Progress Notes (Signed)
Subjective:   Patient ID: [name], female    DOB: [birth year]    MRN: [id number]   Brief patient profile:  51   yowf quit smoking in May 2006 with dx pna/ ards resumed full activity including yardwork at wt 140 and pft's c/w restrictive changes 04/2010 at wt 170 but reported improvement on saba so maintained chronically on symbicort 160  With pfts 02/08/18  c/w GOLD III copd if use FEV1/VC ratio    History of Present Illness  July 18, 2010  1st pulmonary office eval in ER era c/o doe x 6 months progressive indolent onset with copd vs pf.  Ultimately required hosp 8/30-9/1 dx copd/pf ? cause on sulfasalzine and ACE inhib better on 02 and advair but very hoarse with sense of chest congestion and cough with white mucus day > night.  doe x > slow adls.stop advair Start symbicort 160 2 puffs first thing  in am and 2 puffs again in pm about 12 hours later  Work on inhaler technique:   stop benazapril start benicar 40/25  one daily Use 02 sleeping and any activity other than sitting still   04/10/2014 f/u ov/Latania Bascomb re: restrictive lung dz/ RA/psoriatric/ symbicort 160 2bid  Chief Complaint  Patient presents with  . Follow-up    Pt states was advised to f/u per [employer].  Pt has been having increased SOB- relates to stress from dealing with her spouse's illness. She is using o2 pretty much 24/7.   trying to do more than baseline activity and finds she needs 02 more. rec Work on inhaler technique:   Ok to just use the symbicort 160 2 puffs in am to see what difference if any this makes in your breathing  Change 02 to 2lpm 24/7 and use 3lpm with activity    06/04/15  cabg         11/10/2017  f/u ov/Dorian Renfro re: AB / pf related to ALI vs RA / MO  Chief Complaint  Patient presents with  . Follow-up    Breathing has been worse over the past 1-2 months. She states she gets winded just walking from her house to her car. She has been using her albuterol inhaler 4 x daily on average.   on 2lpm at  rest and sleeping needed up to 4lpm continous while doing rehab  but only has pulsed to 3lpm at home per [employer] Much better only while on prednisone / poor hfa noted  Doe = MMRC3 = can't walk 100 yards even at a slow pace at a flat grade s stopping due to sob  Even on 02 but not sure about sats as does not check rec Goal is to keep sats above 90%  - call if you want [employer] to re-evaluate your ambulatory needs  Prednisone 10 mg take  4 each am x 2 days,   2 each am x 2 days,  1 each am x 2 days and stop  Work on inhaler technique:        03/24/2018  f/u ov/Kaysen Deal re:  Copd/pf  GOLD III no change on trelegy vs symb alone / 2lpm 24/7 unless long walk = 3lpm  Chief Complaint  Patient presents with  . Follow-up    Breathing is overall doing well. She is using her albuterol inhaler 2 x per wk on average.   Dyspnea:  Now able to do HT on 3lpm  Cough: no Sleep: 30 degrees propped up on 2lpm  SABA use:  Rarely  rec No change rx    04/05/18  aecopd > pred x 6 days > back to baseline    04/23/2018  f/u ov/Calieb Lichtman re:  GOLD III/ AB / 02 dep  Chief Complaint  Patient presents with  . Follow-up    called 04/05/18 c/o cough, runny nose and wheezing- took pred taper and wheezing has resolved. She c/o runny nose still and occ cough when she laughs. She is using her albuterol inhaler 1-2 x per wk on average.   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       - CT chest 07/03/10 Nonspecific PF mostly upper lobes       - CT chest 12/03/10 acute gg changes  and effusions c/w chf        - PFT's  04/12/10 FEV1  1.21 (69%) ratio 77 and no change p B2,  DLC0 56% Asthmatic bronchitis     - HFA 50% p coaching July 18, 2010 >>  90%  02/25/2011  history of mild vitamin D deficiency        Objective:   Physical Exam    amb wf nad   Vital signs reviewed - Note on arrival 02 sats  95% on  2lpm pulsed  (24/7)        wt 192 July 18, 2010 > 197 August 21, 2010 > 207 December 05, 2010  > 183  01/21/2011  > 179 02/25/2011 >189 08/10/2012 >195 09/08/12 >200 12/27/12 > 202 05/03/2013 > 06/28/2013  199 > 08/08/13 194 > 04/10/2014  203 > 04/19/2015   176 >  07/23/2015 166 > 09/16/2016  210 >  10/29/2016  209 > 04/29/2017    206 > 11/10/2017   209 > 02/08/2018  207 > 03/24/2018 209> 04/23/2018  208        HEENT: nl  oropharynx. Nl external ear canals without cough reflex - moderate bilateral non-specific turbinate edema/ top dentures      NECK :  without JVD/Nodes/TM/ nl carotid upstrokes bilaterally   LUNGS: no acc muscle use,  Mod barrel  contour chest wall with bilateral  Distant bs s audible wheeze and  without cough on insp or exp maneuver and mod   Hyperresonant  to  percussion bilaterally     CV:  RRR  no s3 or murmur or increase in P2, and no edema   ABD:  soft and nontender with pos mid insp Hoover's  in the supine position. No bruits or organomegaly appreciated, bowel sounds nl  MS:   Nl gait/  ext warm without deformities, calf tenderness, cyanosis or clubbing No obvious joint restrictions   SKIN: warm and dry without lesions    NEURO:  alert, approp, nl sensorium with  no motor or cerebellar deficits apparent.

## 2018-04-23 NOTE — Patient Instructions (Addendum)
No change in medications and keep your previous appt  Work on maintaining perfect  inhaler technique:  relax and gently blow all the way out then take a nice smooth deep breath back in, triggering the inhaler at same time you start breathing in.  Hold for up to 5 seconds if you can. Blow out thru nose. Rinse and gargle with water when done

## 2018-04-23 NOTE — Assessment & Plan Note (Addendum)
Quit smoking May 2006       - PFT's  04/12/10 FEV1  1.21 (69%) ratio 77 and no change p B2,  DLC0 56%   VC 70%         - PFTs  08/08/2013 FEV1 1.21 (60%) ratio 86 and no change p B2 DLCO 79%  VC 72%  On symbicort 160 2bid  - PFT's  02/08/2018  FEV1 0.70 (40 % ) ratio 56 if use FEV1/VC  p 38 % improvement from saba p symb 160 prior to study with DLCO  78 % corrects to 147  % for alv volume   - 02/08/2018  After extensive coaching inhaler device  effectiveness =    75% with mdi and 90% with dpi so change to Trelegy > no change from sample so rec resume symb 160 2 bid as of 03/24/2018   - 04/23/2018  After extensive coaching inhaler device  effectiveness =   90% from a baseline of 75% (short ti)   Adequate control on present rx, reviewed in detail with pt > no change in rx needed    Each maintenance medication was reviewed in detail including most importantly the difference between maintenance and as needed and under what circumstances the prns are to be used.  Please see AVS for specific  Instructions which are unique to this visit and I personally typed out  which were reviewed in detail in writing with the patient and a copy provided.    See device teaching which extended face to face time for this visit

## 2018-04-24 ENCOUNTER — Encounter: Payer: Self-pay | Admitting: Internal Medicine

## 2018-04-24 NOTE — Assessment & Plan Note (Signed)
-   02 dependent  since 07/02/10 >>  83% RA December 05, 2010       - ONO RA 08/05/12  :  Positive sat < 89 x 2:48m> repeat on 2lpm rec 08/12/2012  - 06/17/2013 reported desat with activity p Knee surgery > rec restart 2lpm with activity  - 06/27/2013   Walked 2lpm  x one lap @ 185 stopped due to sat 88% not sob , desat to 82% on RA just at the  Start of the walk  - 04/10/14  Sats  81% RA and then  Walked 3lpm x  2 laps @ 185 ft each stopped due to end of study, no desat  - HCO3  11/09/17  = 31    04/23/2018    rec 2lpm at rest/ 3lpm with activity /POC> titrate up to keep > 90%

## 2018-04-24 NOTE — Assessment & Plan Note (Signed)
pfts with erv 14% 06/04/15  And 33% 02/08/2018    Body mass index is 43.6 kg/m.  -  Trending no change  Lab Results  Component Value Date   TSH 0.450 11/09/2017     Contributing to gerd risk/ doe/reviewed the need and the process to achieve and maintain neg calorie balance > defer f/u primary care including intermittently monitoring thyroid status    Reminded re use of approp levels of 02 supplementation (goal >90%)  with ex to help burn fat

## 2018-05-03 DIAGNOSIS — M0589 Other rheumatoid arthritis with rheumatoid factor of multiple sites: Secondary | ICD-10-CM | POA: Diagnosis not present

## 2018-05-24 DIAGNOSIS — I252 Old myocardial infarction: Secondary | ICD-10-CM | POA: Diagnosis not present

## 2018-05-24 DIAGNOSIS — M150 Primary generalized (osteo)arthritis: Secondary | ICD-10-CM | POA: Diagnosis not present

## 2018-05-24 DIAGNOSIS — Z13820 Encounter for screening for osteoporosis: Secondary | ICD-10-CM | POA: Diagnosis not present

## 2018-05-24 DIAGNOSIS — Z79899 Other long term (current) drug therapy: Secondary | ICD-10-CM | POA: Diagnosis not present

## 2018-05-24 DIAGNOSIS — Z6841 Body Mass Index (BMI) 40.0 and over, adult: Secondary | ICD-10-CM | POA: Diagnosis not present

## 2018-05-24 DIAGNOSIS — L409 Psoriasis, unspecified: Secondary | ICD-10-CM | POA: Diagnosis not present

## 2018-05-24 DIAGNOSIS — M0589 Other rheumatoid arthritis with rheumatoid factor of multiple sites: Secondary | ICD-10-CM | POA: Diagnosis not present

## 2018-05-26 ENCOUNTER — Other Ambulatory Visit: Payer: Self-pay | Admitting: Internal Medicine

## 2018-05-26 DIAGNOSIS — R5381 Other malaise: Secondary | ICD-10-CM

## 2018-06-01 DIAGNOSIS — M0589 Other rheumatoid arthritis with rheumatoid factor of multiple sites: Secondary | ICD-10-CM | POA: Diagnosis not present

## 2018-06-21 ENCOUNTER — Encounter: Payer: Self-pay | Admitting: Internal Medicine

## 2018-06-21 ENCOUNTER — Ambulatory Visit (INDEPENDENT_AMBULATORY_CARE_PROVIDER_SITE_OTHER): Payer: Medicare Other | Admitting: Internal Medicine

## 2018-06-21 VITALS — BP 134/60 | HR 85 | Temp 98.2°F | Resp 16 | Wt 206.0 lb

## 2018-06-21 DIAGNOSIS — Z951 Presence of aortocoronary bypass graft: Secondary | ICD-10-CM

## 2018-06-21 DIAGNOSIS — J9611 Chronic respiratory failure with hypoxia: Secondary | ICD-10-CM

## 2018-06-21 DIAGNOSIS — I2510 Atherosclerotic heart disease of native coronary artery without angina pectoris: Secondary | ICD-10-CM | POA: Diagnosis not present

## 2018-06-21 DIAGNOSIS — J9612 Chronic respiratory failure with hypercapnia: Secondary | ICD-10-CM | POA: Diagnosis not present

## 2018-06-21 DIAGNOSIS — J8410 Pulmonary fibrosis, unspecified: Secondary | ICD-10-CM | POA: Diagnosis not present

## 2018-06-21 DIAGNOSIS — E039 Hypothyroidism, unspecified: Secondary | ICD-10-CM | POA: Diagnosis not present

## 2018-06-21 DIAGNOSIS — L4050 Arthropathic psoriasis, unspecified: Secondary | ICD-10-CM | POA: Diagnosis not present

## 2018-06-21 DIAGNOSIS — I10 Essential (primary) hypertension: Secondary | ICD-10-CM | POA: Diagnosis not present

## 2018-06-21 NOTE — Progress Notes (Signed)
Subjective:    Patient ID: [name], female    DOB: [birth year], 72 y.o.   MRN: [id number]  HPI  72 year old patient who is seen today for follow-up. She has a history of coronary artery disease and is followed by cardiology. She has interstitial lung disease with chronic hypoxic respiratory failure and is also followed closely by pulmonary medicine. She has psoriatic arthritis and followed by rheumatology  She has stable pulmonary and rheumatologic complaints  Her cardiac status has been stable.  She remains on supplemental levothyroxine as well as statin therapy  Past Medical History:  Diagnosis Date  . Acute bronchitis 09/25/2015  . ACUTE ON CHRONIC DIASTOLIC HEART FAILURE 12/04/2010   Qualifier: Diagnosis of  By: Ladona Ridgel, MD, [facility], [place]   . Arthritis    OA RIGHT KNEE WITH PAIN  . Barrett esophagus   . Bradycardia 06/01/2015  . Chronic respiratory failure (HCC)   . Chronic respiratory failure with hypoxia and hypercapnia (HCC) 02/04/2010   Followed in Pulmonary clinic/ [facility]/ [facility]       - 02 dependent  since 07/02/10 >>  83% RA December 05, 2010       - ONO RA 08/05/12  :  Positive sat < 89 x 2:23m> repeat on 2lpm rec 08/12/2012  - 06/17/2013 reported desat with activity p Knee surgery > rec restart 2lpm with activity  - 06/27/2013   Walked 2lpm  x one lap @ 185 stopped due to sat 88% not sob , desat to 82% on RA just at th  . COPD (chronic obstructive pulmonary disease) (HCC)   . COPD III spirometry if use FEV1/VC p saba  07/18/2010   Quit smoking May 2006       - PFT's  04/12/10 FEV1  1.21 (69%) ratio 77 and no change p B2,  DLC0 56%   VC 70%         - PFTs  08/08/2013 FEV1 1.21 (60%) ratio 86 and no change p B2 DLCO 79%  VC 72%  On symbicort 160 2bid  - PFT's  02/08/2018  FEV1 0.70 (40 % ) ratio 56 if use FEV1/VC  p 38 % improvement from saba p symb 160 prior to study with DLCO  78 % corrects to 147  % for alv volume   - 02/08/2018  . Cough variant asthma 02/26/2011   Followed in Pulmonary clinic/ [facility]/ [place]  - PFT's  06/04/15  FEV1 1.20 (67 % ) ratio 83  p 6 % improvement from saba with DLCO  80 % corrects to 132 % for alv volume      - Clinical dx based on response to symbicort       FENO 09/16/2016  =   96 on symbicort 160 2bid > added singulair  Allergy profile 09/16/2016 >  Eos 0.5 /  IgE  78 neg RAST  -  Referred to rehab 04/29/2017 > completed  . DOE (dyspnea on exertion) 02/19/2016  . Essential hypertension 04/20/2007   Qualifier: Diagnosis of  By: Marcelyn Ditty RN, Katy Fitch   . GERD (gastroesophageal reflux disease)   . History of ARDS 2006  . History of home oxygen therapy    AT NIGHT WHEN SLEEPING 2 L / MIN NASAL CANNULA  . Hyperlipidemia 07/12/2015  . Hypertension   . Hypothyroidism   . Morbid (severe) obesity due to excess calories (HCC) 04/22/2015   pfts with erv 14% 06/04/15  And 33% 02/08/2018   . NSTEMI (  non-ST elevated myocardial infarction) (HCC) 05/31/2015  . Pneumococcal pneumonia (HCC) 2006   HOSPITALIZED AND DEVELOPED ARDS  . Psoriatic arthritis (HCC)   . Pulmonary fibrosis (HCC)   . PULMONARY FIBROSIS ILD POST INFLAMMATORY CHRONIC 07/18/2010   Followed as Primary Care Patient/ [facility]/ [place]  -s/p ARDS 2006 with bacteremic S  Pna       - CT chest 07/03/10 Nonspecific PF mostly upper lobes       - CT chest 12/03/10 acute gg changes and effusions c/w chf - PFT's  02/08/2018  FVC 0.64 (28 %)   with DLCO  78 % corrects to 147 % for alv volume     . Rheumatic disease   . 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 SOB (shortness of breath) on exertion      Social History   Socioeconomic History  . Marital status: Married    Spouse name: Not on file  . Number of children: Not on file  . Years of education: Not on file  . Highest education level: Not on file  Occupational History  . 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 Financial resource strain: Not on file  . Food insecurity:    Worry: Not on file     Inability: Not on file  . Transportation needs:    Medical: Not on file    Non-medical: Not on file  Tobacco Use  . Smoking status: Former Smoker    Packs/day: 1.50    Years: 58.50    Pack years: 87.75    Types: Cigarettes    Last attempt to quit: 03/03/2005    Years since quitting: 13.3  . Smokeless tobacco: Never Used  Substance and Sexual Activity  . Alcohol use: No  . Drug use: No  . Sexual activity: Not on file  Lifestyle  . Physical activity:    Days per week: Not on file    Minutes per session: Not on file  . Stress: Not on file  Relationships  . Social connections:    Talks on phone: Not on file    Gets together: Not on file    Attends religious service: Not on file    Active member of club or organization: Not on file    Attends meetings of clubs or organizations: Not on file    Relationship status: Not on file  . Intimate partner violence:    Fear of current or ex partner: Not on file    Emotionally abused: Not on file    Physically abused: Not on file    Forced sexual activity: Not on file  Other Topics Concern  . Not on file  Social History Narrative  . Not on file    Past Surgical History:  Procedure Laterality Date  . ABDOMINOPLASTY    . CARDIAC CATHETERIZATION N/A 06/01/2015   Procedure: Left Heart Cath and Coronary Angiography;  Surgeon: Runell Gess, MD;  Location: [facility] INVASIVE CV LAB;  Service: Cardiovascular;  Laterality: N/A;  . CARPAL TUNNEL RELEASE    . CHOLECYSTECTOMY    . CORONARY ARTERY BYPASS GRAFT N/A 06/04/2015   Procedure: CORONARY ARTERY BYPASS GRAFT times three            with left internal mammary artery and right leg saphenous vein;  Surgeon: Alleen Borne, MD;  Location: [facility] OR;  Service: Open Heart Surgery;  Laterality: N/A;  . cosmetic breast surgery    . JOINT  REPLACEMENT    . KNEE ARTHROSCOPY Left   . TEE WITHOUT CARDIOVERSION  06/04/2015   Procedure: TRANSESOPHAGEAL ECHOCARDIOGRAM (TEE);  Surgeon: Alleen Borne, MD;  Location:  [facility] OR;  Service: Open Heart Surgery;;  . TOTAL KNEE ARTHROPLASTY Left   . TOTAL KNEE ARTHROPLASTY Right 06/06/2013   Procedure: RIGHT TOTAL KNEE ARTHROPLASTY;  Surgeon: Loanne Drilling, MD;  Location: [facility] ORS;  Service: Orthopedics;  Laterality: Right;    Family History  Problem Relation Age of Onset  . Breast cancer Mother   . Coronary artery disease Father   . Rheum arthritis Father     No Known Allergies  Current Outpatient Medications on File Prior to Visit  Medication Sig Dispense Refill  . acetaminophen (TYLENOL ARTHRITIS PAIN) 650 MG CR tablet Take 1,300 mg by mouth every 8 (eight) hours as needed for pain. Per bottle directions as needed    . albuterol (PROAIR HFA) 108 (90 Base) MCG/ACT inhaler 2 puffs every 4 hours as needed only  if your can't catch your breath 1 Inhaler 11  . aspirin EC 81 MG tablet Take 1 tablet (81 mg total) by mouth daily. 90 tablet 3  . atorvastatin (LIPITOR) 80 MG tablet TAKE 1 TABLET BY MOUTH  DAILY AT 6 PM. 90 tablet 2  . Cholecalciferol (VITAMIN D3) 2000 UNITS TABS Take 2,000 Int'l Units by mouth daily.    . citalopram (CELEXA) 40 MG tablet TAKE 1 TABLET BY MOUTH  EVERY MORNING 90 tablet 0  . famotidine (PEPCID) 20 MG tablet One at bedtime    . furosemide (LASIX) 20 MG tablet TAKE 1 TABLET BY MOUTH TWO  TIMES DAILY (Patient taking differently: Take 20 mg by mouth daily. ) 180 tablet 2  . Golimumab (SIMPONI ARIA IV) Inject into the vein every 8 (eight) weeks.    Marland Kitchen levothyroxine (SYNTHROID, LEVOTHROID) 150 MCG tablet TAKE 1 TABLET BY MOUTH  DAILY BEFORE BREAKFAST 90 tablet 2  . losartan (COZAAR) 50 MG tablet Take 1 tablet (50 mg total) by mouth daily. 90 tablet 3  . montelukast (SINGULAIR) 10 MG tablet TAKE 1 TABLET BY MOUTH AT  BEDTIME 90 tablet 3  . NON FORMULARY 2lpm 24/7    . pantoprazole (PROTONIX) 40 MG tablet TAKE 1 TABLET BY MOUTH  DAILY 30 TO 60 MINUTES  BEFORE FIRST MEAL OF THE  DAY 90 tablet 0  . SYMBICORT 160-4.5 MCG/ACT inhaler USE 2 PUFFS  TWO TIMES DAILY 30.6 g 1  . traMADol (ULTRAM) 50 MG tablet TAKE 1-2 TABLETS EVERY 6 HOURS AS NEEDED FOR PAIN 90 tablet 0  . triamcinolone cream (KENALOG) 0.1 % Apply 1 application topically 2 (two) times daily as needed (for psorisis).     No current facility-administered medications on file prior to visit.     BP 134/60   Pulse 85   Temp 98.2 F (36.8 C) (Oral)   Resp 16   Wt 206 lb (93.4 kg)   SpO2 93% Comment: 2 lpm via [place]  BMI 43.05 kg/m     Review of Systems  Constitutional: Positive for activity change and fatigue.  HENT: Negative for congestion, dental problem, hearing loss, rhinorrhea, sinus pressure, sore throat and tinnitus.   Eyes: Negative for pain, discharge and visual disturbance.  Respiratory: Positive for shortness of breath. Negative for cough.   Cardiovascular: Negative for chest pain, palpitations and leg swelling.  Gastrointestinal: Negative for abdominal distention, abdominal pain, blood in stool, constipation, diarrhea, nausea and vomiting.  Genitourinary: Negative for  difficulty urinating, dysuria, flank pain, frequency, hematuria, pelvic pain, urgency, vaginal bleeding, vaginal discharge and vaginal pain.  Musculoskeletal: Positive for arthralgias and back pain. Negative for gait problem and joint swelling.  Skin: Negative for rash.  Neurological: Negative for dizziness, syncope, speech difficulty, weakness, numbness and headaches.  Hematological: Negative for adenopathy.  Psychiatric/Behavioral: Negative for agitation, behavioral problems and dysphoric mood. The patient is not nervous/anxious.        Objective:   Physical Exam  Constitutional: She is oriented to person, place, and time. She appears well-developed and well-nourished.  Weight 206 Blood pressure well controlled O2 saturation 93%  HENT:  Head: Normocephalic.  Right Ear: External ear normal.  Left Ear: External ear normal.  Mouth/Throat: Oropharynx is clear and moist.  Eyes: Pupils  are equal, round, and reactive to light. Conjunctivae and EOM are normal.  Neck: Normal range of motion. Neck supple. No thyromegaly present.  Cardiovascular: Normal rate, regular rhythm, normal heart sounds and intact distal pulses.  Status post sternotomy  Pulmonary/Chest: Effort normal. She has wheezes. She has rales.  Nasal cannula O2 in place  Abdominal: Soft. Bowel sounds are normal. She exhibits no mass. There is no tenderness.  Musculoskeletal: Normal range of motion.  Lymphadenopathy:    She has no cervical adenopathy.  Neurological: She is alert and oriented to person, place, and time.  Skin: Skin is warm and dry. No rash noted.  Psychiatric: She has a normal mood and affect. Her behavior is normal.          Assessment & Plan:   Coronary artery disease Pulmonary fibrosis.  Follow-up pulmonary medicine Psoriatic arthritis follow-up rheumatology Essential hypertension stable  Recent laboratory studies reviewed Patient will follow-up with new PCP in 3 to 4 months  [facility] [employer]

## 2018-06-21 NOTE — Patient Instructions (Signed)
Limit your sodium (Salt) intake  Pulmonary cardiology and rheumatology follow-up as scheduled

## 2018-06-23 ENCOUNTER — Encounter: Payer: Self-pay | Admitting: Cardiology

## 2018-06-23 ENCOUNTER — Encounter: Payer: Self-pay | Admitting: Gastroenterology

## 2018-06-23 ENCOUNTER — Ambulatory Visit (INDEPENDENT_AMBULATORY_CARE_PROVIDER_SITE_OTHER): Payer: Medicare Other | Admitting: Cardiology

## 2018-06-23 VITALS — BP 100/68 | HR 99 | Ht <= 58 in | Wt 203.8 lb

## 2018-06-23 DIAGNOSIS — Z951 Presence of aortocoronary bypass graft: Secondary | ICD-10-CM | POA: Diagnosis not present

## 2018-06-23 DIAGNOSIS — I10 Essential (primary) hypertension: Secondary | ICD-10-CM | POA: Diagnosis not present

## 2018-06-23 DIAGNOSIS — K6289 Other specified diseases of anus and rectum: Secondary | ICD-10-CM

## 2018-06-23 DIAGNOSIS — R197 Diarrhea, unspecified: Secondary | ICD-10-CM

## 2018-06-23 DIAGNOSIS — E782 Mixed hyperlipidemia: Secondary | ICD-10-CM | POA: Diagnosis not present

## 2018-06-23 DIAGNOSIS — I2510 Atherosclerotic heart disease of native coronary artery without angina pectoris: Secondary | ICD-10-CM

## 2018-06-23 DIAGNOSIS — R198 Other specified symptoms and signs involving the digestive system and abdomen: Secondary | ICD-10-CM | POA: Diagnosis not present

## 2018-06-23 MED ORDER — LOSARTAN POTASSIUM 25 MG PO TABS: 25.0000 mg | ORAL_TABLET | Freq: Every day | ORAL | 3 refills | Status: DC

## 2018-06-23 NOTE — Patient Instructions (Signed)
Medication Instructions:   DECREASE YOUR LOSARTAN TO 25 MG ONCE DAILY   Labwork:  TODAY--CMET, TSH, CBC W DIFF     You have been referred to [facility] GASTROENTEROLOGY     Follow-Up:  Your physician wants you to follow-up in: 6 MONTHS WITH DR Johnell Comings will receive a reminder letter in the mail two months in advance. If you don't receive a letter, please call our office to schedule the follow-up appointment.        If you need a refill on your cardiac medications before your next appointment, please call your pharmacy.

## 2018-06-23 NOTE — Progress Notes (Signed)
Patient ID: [name], female   DOB: [birth year], 72 y.o.   MRN: [id number]      Cardiology Office Note  Date:  06/23/2018   ID:  KANITRA Huff, DOB October 23, 1946, MRN [id number]  PCP:  Gordy Savers, MD  Cardiologist: Tobias Alexander, MD   CC:    History of Present Illness: [name] is a 72 y.o. female who presents for post hospitalization visit after NSTEMI--> 3 VD --> emergent CABG in July 2016. She is mourning as her husband just passed away. She denies chest pain, SOB, no muscle pain, na palpitations, syncope, no LE edema, orthopnea, PND.  Patient comes after 2 months, at the last visit she complained that she has gained 15 pounds, today she again gained another 8 pounds. She has noticed to get short of breath with any activity but she attributes this to weight gain. She wakes up at night feeling short of breath. She has been having chronic cough for months on with white yellowish sputum. No fevers or chills. No lower extremity edema. No chest pain palpitations or syncope.   07/02/16 - she comes after 3 months, she continues to use home O2 at night and also when she has to do more than mild exertion. Stable SOB, her baseline for several years. She developed productive cough and received antibiotics and steroids from her PCP with some improvement. Denies LE edema, orthopnea or PND. No chest pain or palpitations. She is still very tearful when talking about Joe.   10/03/2016 - the patient is coming after 3 months, in the interim she started to use her home oxygen more frequently as she feel more short of breath, she follows with Dr. Sherene Sires for interstitial lung disease and pulmonary fibrosis, currently on 2 L of oxygen. She denies any chest pain, she is able to do all activities of daily living including shopping and cooking. No palpitations or syncope. She is tolerating atorvastatin well.  01/05/2017 - this is 3 months follow-up, patient is overall doing well with stable shortness of  breath on 2 L of oxygen 24/7, the only change right now is that she feels she needs to use oxygen all the time. She denies any chest pain or shortness of breath. She manages lower extremity edema with 20 mg of Lasix in the morning and as needed in the afternoon but she usually doesn't need to take it. No palpitations or syncope.   11/09/17 - this is one year follow-up, the patient states that from cardiac standpoint she feels stable, she denies any chest pain other than occasional itching in her sternotomy site. She has stable dyspnea on exertion that attributes to her chronic COPD that is O2 dependent 24/7. She's been compliant with her medications, she has been experiencing night cramping. Denies any lower extremity edema claudications, no orthopnea or proximal nocturnal dyspnea and no syncope.  06/23/2018 - 6 months follow up, no chest pain, stable DOE, walking with 24/7 oxygen, follows with pulmonary, denies any lower extremity edema palpitations or claudications.  She is tolerating her medications well.  Her complaint is constant urge to go to the bathroom for a #2 and ongoing diarrhea for several months.  Also rectal pain.   Past Medical History:  Diagnosis Date  . Acute bronchitis 09/25/2015  . ACUTE ON CHRONIC DIASTOLIC HEART FAILURE 12/04/2010   Qualifier: Diagnosis of  By: Ladona Ridgel, MD, [facility], Vergia Alcon   . Arthritis    OA RIGHT KNEE WITH PAIN  . Barrett  esophagus   . Bradycardia 06/01/2015  . Chronic respiratory failure (HCC)   . Chronic respiratory failure with hypoxia and hypercapnia (HCC) 02/04/2010   Followed in Pulmonary clinic/ [facility]/ [facility]       - 02 dependent  since 07/02/10 >>  83% RA December 05, 2010       - ONO RA 08/05/12  :  Positive sat < 89 x 2:93m> repeat on 2lpm rec 08/12/2012  - 06/17/2013 reported desat with activity p Knee surgery > rec restart 2lpm with activity  - 06/27/2013   Walked 2lpm  x one lap @ 185 stopped due to sat 88% not sob , desat to 82% on RA just at  th  . COPD (chronic obstructive pulmonary disease) (HCC)   . COPD III spirometry if use FEV1/VC p saba  07/18/2010   Quit smoking May 2006       - PFT's  04/12/10 FEV1  1.21 (69%) ratio 77 and no change p B2,  DLC0 56%   VC 70%         - PFTs  08/08/2013 FEV1 1.21 (60%) ratio 86 and no change p B2 DLCO 79%  VC 72%  On symbicort 160 2bid  - PFT's  02/08/2018  FEV1 0.70 (40 % ) ratio 56 if use FEV1/VC  p 38 % improvement from saba p symb 160 prior to study with DLCO  78 % corrects to 147  % for alv volume   - 02/08/2018  . Cough variant asthma 02/26/2011   Followed in Pulmonary clinic/ [facility]/ [facility]  - PFT's  06/04/15  FEV1 1.20 (67 % ) ratio 83  p 6 % improvement from saba with DLCO  80 % corrects to 132 % for alv volume      - Clinical dx based on response to symbicort       FENO 09/16/2016  =   96 on symbicort 160 2bid > added singulair  Allergy profile 09/16/2016 >  Eos 0.5 /  IgE  78 neg RAST  -  Referred to rehab 04/29/2017 > completed  . DOE (dyspnea on exertion) 02/19/2016  . Essential hypertension 04/20/2007   Qualifier: Diagnosis of  By: Marcelyn Ditty RN, Katy Fitch   . GERD (gastroesophageal reflux disease)   . History of ARDS 2006  . History of home oxygen therapy    AT NIGHT WHEN SLEEPING 2 L / MIN NASAL CANNULA  . Hyperlipidemia 07/12/2015  . Hypertension   . Hypothyroidism   . Morbid (severe) obesity due to excess calories (HCC) 04/22/2015   pfts with erv 14% 06/04/15  And 33% 02/08/2018   . NSTEMI (non-ST elevated myocardial infarction) (HCC) 05/31/2015  . Pneumococcal pneumonia (HCC) 2006   HOSPITALIZED AND DEVELOPED ARDS  . Psoriatic arthritis (HCC)   . Pulmonary fibrosis (HCC)   . PULMONARY FIBROSIS ILD POST INFLAMMATORY CHRONIC 07/18/2010   Followed as Primary Care Patient/ [facility]/ [facility]  -s/p ARDS 2006 with bacteremic S  Pna       - CT chest 07/03/10 Nonspecific PF mostly upper lobes       - CT chest 12/03/10 acute gg changes and effusions c/w chf - PFT's  02/08/2018  FVC 0.64 (28 %)    with DLCO  78 % corrects to 147 % for alv volume     . Rheumatic disease   . 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 SOB (shortness of breath) on exertion     Past Surgical History:  Procedure Laterality Date  . ABDOMINOPLASTY    . CARDIAC CATHETERIZATION N/A 06/01/2015   Procedure: Left Heart Cath and Coronary Angiography;  Surgeon: Runell Gess, MD;  Location: [facility] INVASIVE CV LAB;  Service: Cardiovascular;  Laterality: N/A;  . CARPAL TUNNEL RELEASE    . CHOLECYSTECTOMY    . CORONARY ARTERY BYPASS GRAFT N/A 06/04/2015   Procedure: CORONARY ARTERY BYPASS GRAFT times three            with left internal mammary artery and right leg saphenous vein;  Surgeon: Alleen Borne, MD;  Location: [facility] OR;  Service: Open Heart Surgery;  Laterality: N/A;  . cosmetic breast surgery    . JOINT REPLACEMENT    . KNEE ARTHROSCOPY Left   . TEE WITHOUT CARDIOVERSION  06/04/2015   Procedure: TRANSESOPHAGEAL ECHOCARDIOGRAM (TEE);  Surgeon: Alleen Borne, MD;  Location: [facility] OR;  Service: Open Heart Surgery;;  . TOTAL KNEE ARTHROPLASTY Left   . TOTAL KNEE ARTHROPLASTY Right 06/06/2013   Procedure: RIGHT TOTAL KNEE ARTHROPLASTY;  Surgeon: Loanne Drilling, MD;  Location: [facility] ORS;  Service: Orthopedics;  Laterality: Right;   Current Outpatient Medications  Medication Sig Dispense Refill  . acetaminophen (TYLENOL ARTHRITIS PAIN) 650 MG CR tablet Take 1,300 mg by mouth every 8 (eight) hours as needed for pain. Per bottle directions as needed    . albuterol (PROAIR HFA) 108 (90 Base) MCG/ACT inhaler 2 puffs every 4 hours as needed only  if your can't catch your breath 1 Inhaler 11  . aspirin EC 81 MG tablet Take 1 tablet (81 mg total) by mouth daily. 90 tablet 3  . atorvastatin (LIPITOR) 80 MG tablet TAKE 1 TABLET BY MOUTH  DAILY AT 6 PM. 90 tablet 2  . Cholecalciferol (VITAMIN D3) 2000 UNITS TABS Take 2,000 Int'l Units by mouth daily.    . citalopram (CELEXA) 40 MG tablet TAKE 1 TABLET BY MOUTH  EVERY MORNING 90 tablet 0  . famotidine  (PEPCID) 20 MG tablet One at bedtime    . furosemide (LASIX) 20 MG tablet TAKE 1 TABLET BY MOUTH TWO  TIMES DAILY (Patient taking differently: Take 20 mg by mouth daily. ) 180 tablet 2  . Golimumab (SIMPONI ARIA IV) Inject into the vein every 8 (eight) weeks.    Marland Kitchen levothyroxine (SYNTHROID, LEVOTHROID) 150 MCG tablet TAKE 1 TABLET BY MOUTH  DAILY BEFORE BREAKFAST 90 tablet 2  . losartan (COZAAR) 50 MG tablet Take 1 tablet (50 mg total) by mouth daily. 90 tablet 3  . montelukast (SINGULAIR) 10 MG tablet TAKE 1 TABLET BY MOUTH AT  BEDTIME 90 tablet 3  . NON FORMULARY 2lpm 24/7    . pantoprazole (PROTONIX) 40 MG tablet TAKE 1 TABLET BY MOUTH  DAILY 30 TO 60 MINUTES  BEFORE FIRST MEAL OF THE  DAY 90 tablet 0  . SYMBICORT 160-4.5 MCG/ACT inhaler USE 2 PUFFS TWO TIMES DAILY 30.6 g 1  . traMADol (ULTRAM) 50 MG tablet TAKE 1-2 TABLETS EVERY 6 HOURS AS NEEDED FOR PAIN 90 tablet 0  . triamcinolone cream (KENALOG) 0.1 % Apply 1 application topically 2 (two) times daily as needed (for psorisis).     No current facility-administered medications for this visit.    Allergies:   Patient has no known allergies.   Social History:  The patient  reports that she quit smoking about 13 years ago. Her smoking use included cigarettes. She has a 87.75 pack-year smoking history. She has never used smokeless tobacco. She reports  that she does not drink alcohol or use drugs.   Family History:  The patient's family history includes Breast cancer in her mother; Coronary artery disease in her father; Rheum arthritis in her father.   ROS:  Please see the history of present illness.  All other systems are reviewed and negative.   PHYSICAL EXAM: VS:  BP 100/68   Pulse 99   Ht 4\' 10"  (1.473 m)   Wt 203 lb 12.8 oz (92.4 kg)   SpO2 (!) 88%   BMI 42.59 kg/m  , BMI Body mass index is 42.59 kg/m. GEN: Well nourished, well developed, in no acute distress  HEENT: normal  Neck: no JVD, carotid bruits, or masses Cardiac: RRR;  no 3/6 systolic murmur, also rub, no gallops , no LE edema  Respiratory:  B/L wheezes. GI: soft, nontender, nondistended, + BS MS: no deformity or atrophy  Skin: warm and dry, no rash Neuro:  Strength and sensation are intact Psych: euthymic mood, full affect  EKG:  SRLAE, iRBBB, anterior MI age undetermined  Recent Labs: 11/09/2017: ALT 15; BUN 13; Creatinine, Ser 0.88; Hemoglobin 11.8; Magnesium 1.8; Platelets 196; Potassium 4.1; Sodium 143; TSH 0.450   Lipid Panel    Component Value Date/Time   CHOL 148 11/09/2017 1528   CHOL 117 07/12/2015 1508   TRIG 117 11/09/2017 1528   TRIG 75 07/12/2015 1508   HDL 67 11/09/2017 1528   HDL 51 07/12/2015 1508   CHOLHDL 2.2 11/09/2017 1528   CHOLHDL 2.1 09/29/2016 1335   VLDL 20 09/29/2016 1335   LDLCALC 58 11/09/2017 1528   LDLCALC 51 07/12/2015 1508   LDLDIRECT 131.1 08/10/2012 0829   Wt Readings from Last 3 Encounters:  06/23/18 203 lb 12.8 oz (92.4 kg)  06/21/18 206 lb (93.4 kg)  04/23/18 208 lb 9.6 oz (94.6 kg)    ECHO: 06/01/2015 - Left ventricle: The cavity size was normal. Wall thickness was increased in a pattern of mild LVH. Systolic function was normal. The estimated ejection fraction was in the range of 55% to 60%. There is hypokinesis of the basalinferolateral myocardium. Doppler parameters are consistent with abnormal left ventricular relaxation (grade 1 diastolic dysfunction). - Mitral valve: Calcified annulus. Mildly thickened leaflets .  Impressions: - Hypokinesis of the basal/mild inferior lateral wall with overall preserved LV function; grade 1 diastolic dysfunction; mild LVH; trace MR.  TELEMETRY:  SR  TTE: 06/01/15 Study Conclusions  - Left ventricle: The cavity size was normal. Wall thickness was increased in a pattern of mild LVH. Systolic function was normal. The estimated ejection fraction was in the range of 55% to 60%. There is hypokinesis of the basalinferolateral  myocardium. Doppler parameters are consistent with abnormal left ventricular relaxation (grade 1 diastolic dysfunction). - Mitral valve: Calcified annulus. Mildly thickened leaflets .  Impressions:  - Hypokinesis of the basal/mild inferior lateral wall with overall preserved LV function; grade 1 diastolic dysfunction; mild LVH; trace MR.  TTE: 09/07/2015 - Left ventricle: The cavity size was normal. There was moderate concentric hypertrophy. Systolic function was normal. The estimated ejection fraction was in the range of 60% to 65%. Wall motion was normal; there were no regional wall motion abnormalities. Doppler parameters are consistent with abnormal left ventricular relaxation (grade 1 diastolic dysfunction). There was no evidence of elevated ventricular filling pressure by Doppler parameters. - Aortic valve: There was no regurgitation. - Aortic root: The aortic root was normal in size. - Mitral valve: Calcified annulus. Mildly thickened leaflets . - Left  atrium: The atrium was mildly dilated. - Right ventricle: The cavity size was normal. Wall thickness was normal. Systolic function was normal. - Right atrium: The atrium was normal in size. - Tricuspid valve: There was trivial regurgitation. - Pulmonic valve: There was no regurgitation. - Pulmonary arteries: Systolic pressure was within the normal range. - Inferior vena cava: The vessel was normal in size. - Pericardium, extracardiac: There was no pericardial effusion.  Impressions: - There is mild basal, mid inferior wall hypokinesis, overall LVEF normal.  EKG - performed on 11/09/2017 was personally reviewed and shows normal sinus rhythm with incomplete right bundle branch block and is unchanged from prior.    ASSESSMENT AND PLAN:  1. CAD, s/p NSTEMI (non-ST elevated myocardial infarction), 3-VD with preserved LV function. Repeat echo showed LVEF 60-65%. On aspirin and atorvastatin,  losartan, metoprolol discontinued as she was bradycardic.  She is asymptomatic tolerating all the medications well.  Functional capacity very limited with regards to COPD.  2. 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 She is wheezing today, on Symbicort.  3. Acute on chronic diastolic dysfunction - on Lasix 20 mg by mouth daily, afternoon one PRN.  Euvolemic no change in medications needed.  4. HTN -blood pressure rather low and she feels dizzy when she gets up, I will decrease losartan to 25 mg daily.    5. Hyperlipidemia - on atorvastatin, tolerated.  6. Hypothyroidism -we will recheck today especially that she has ongoing various.  7.  Tenesmus, rectal pain and diarrhea, we will check TSH and refer to GI for colonoscopy.  Follow up in 6 months  Signed, Tobias Alexander, MD  06/23/2018 3:12 PM    [facility] [address] [place], [place], [place]  [address] Phone: [phone]; Fax: [phone]

## 2018-06-24 ENCOUNTER — Other Ambulatory Visit: Payer: Self-pay | Admitting: Internal Medicine

## 2018-06-24 DIAGNOSIS — E2839 Other primary ovarian failure: Secondary | ICD-10-CM

## 2018-06-24 LAB — COMPREHENSIVE METABOLIC PANEL
ALT: 13 IU/L (ref 0–32)
AST: 18 IU/L (ref 0–40)
Albumin/Globulin Ratio: 1.5 (ref 1.2–2.2)
Albumin: 4.3 g/dL (ref 3.5–4.8)
Alkaline Phosphatase: 80 IU/L (ref 39–117)
BUN/Creatinine Ratio: 18 (ref 12–28)
BUN: 16 mg/dL (ref 8–27)
Bilirubin Total: 0.3 mg/dL (ref 0.0–1.2)
CO2: 31 mmol/L — ABNORMAL HIGH (ref 20–29)
Calcium: 10.1 mg/dL (ref 8.7–10.3)
Chloride: 97 mmol/L (ref 96–106)
Creatinine, Ser: 0.89 mg/dL (ref 0.57–1.00)
GFR calc Af Amer: 75 mL/min/{1.73_m2} (ref >59)
GFR calc non Af Amer: 65 mL/min/{1.73_m2} (ref >59)
Globulin, Total: 2.9 g/dL (ref 1.5–4.5)
Glucose: 100 mg/dL — ABNORMAL HIGH (ref 65–99)
Potassium: 4.9 mmol/L (ref 3.5–5.2)
Sodium: 142 mmol/L (ref 134–144)
Total Protein: 7.2 g/dL (ref 6.0–8.5)

## 2018-06-24 LAB — CBC WITH DIFFERENTIAL/PLATELET
Basophils Absolute: 0 10*3/uL (ref 0.0–0.2)
Basos: 0 %
EOS (ABSOLUTE): 0.8 10*3/uL — ABNORMAL HIGH (ref 0.0–0.4)
Eos: 8 %
Hematocrit: 38.9 % (ref 34.0–46.6)
Hemoglobin: 11.9 g/dL (ref 11.1–15.9)
Immature Grans (Abs): 0 10*3/uL (ref 0.0–0.1)
Immature Granulocytes: 0 %
Lymphocytes Absolute: 2.2 10*3/uL (ref 0.7–3.1)
Lymphs: 22 %
MCH: 28.5 pg (ref 26.6–33.0)
MCHC: 30.6 g/dL — ABNORMAL LOW (ref 31.5–35.7)
MCV: 93 fL (ref 79–97)
Monocytes Absolute: 0.8 10*3/uL (ref 0.1–0.9)
Monocytes: 9 %
Neutrophils Absolute: 5.9 10*3/uL (ref 1.4–7.0)
Neutrophils: 61 %
Platelets: 199 10*3/uL (ref 150–450)
RBC: 4.17 x10E6/uL (ref 3.77–5.28)
RDW: 14 % (ref 12.3–15.4)
WBC: 9.7 10*3/uL (ref 3.4–10.8)

## 2018-06-24 LAB — TSH: TSH: 0.489 u[IU]/mL (ref 0.450–4.500)

## 2018-06-29 DIAGNOSIS — M0589 Other rheumatoid arthritis with rheumatoid factor of multiple sites: Secondary | ICD-10-CM | POA: Diagnosis not present

## 2018-08-09 DIAGNOSIS — Z23 Encounter for immunization: Secondary | ICD-10-CM | POA: Diagnosis not present

## 2018-08-11 ENCOUNTER — Other Ambulatory Visit: Payer: Medicare Other

## 2018-08-11 ENCOUNTER — Encounter: Payer: Self-pay | Admitting: Gastroenterology

## 2018-08-11 ENCOUNTER — Ambulatory Visit (INDEPENDENT_AMBULATORY_CARE_PROVIDER_SITE_OTHER): Payer: Medicare Other | Admitting: Gastroenterology

## 2018-08-11 ENCOUNTER — Encounter (INDEPENDENT_AMBULATORY_CARE_PROVIDER_SITE_OTHER): Payer: Self-pay

## 2018-08-11 VITALS — BP 140/64 | HR 68 | Ht <= 58 in | Wt 206.2 lb

## 2018-08-11 DIAGNOSIS — K529 Noninfective gastroenteritis and colitis, unspecified: Secondary | ICD-10-CM | POA: Diagnosis not present

## 2018-08-11 DIAGNOSIS — I2510 Atherosclerotic heart disease of native coronary artery without angina pectoris: Secondary | ICD-10-CM | POA: Diagnosis not present

## 2018-08-11 DIAGNOSIS — R198 Other specified symptoms and signs involving the digestive system and abdomen: Secondary | ICD-10-CM | POA: Diagnosis not present

## 2018-08-11 NOTE — Progress Notes (Signed)
[facility] Consult Note:  History: [name] 08/11/2018  Referring physician: Gordy Savers, MD  Reason for consult/chief complaint: Diarrhea (Hx of a Flex Sig in 2003 but patient does not recall ever having a colonoscopy); Hemorrhoids; Rectal pressure; and Encopresis   Subjective  HPI:  This is a very pleasant 72 year old woman referred by primary care for over a year of chronic diarrhea.  She reports 7-10 times a day a feeling of pressure in the rectum with passage of soft but not loose or watery stool.  She might also have a burning discomfort in the rectum prior to the bowel movement.  She has definitely noticed this will come on with stress, particularly if she feels short of breath as if she is not getting enough oxygen.  She has been on chronic supplemental oxygen for her COPD and interstitial lung disease for at least the last 2 years.   She denies nausea or vomiting or unexpected weight loss.  For many years she has had rare episodes of what on the paper that she is attributed to hemorrhoids.  She feels as if she must have hemorrhoids because sometimes there is chronic perianal burning and itching.  [name] has never had a colonoscopy, but underwent a routine sigmoidoscopy by primary care in 2003.  There is a handwritten note of the report that seems to indicate it was normal but limited by patient discomfort.  ROS:  Review of Systems  Constitutional: Negative for appetite change and unexpected weight change.  HENT: Negative for mouth sores and voice change.   Eyes: Negative for pain and redness.  Respiratory: Positive for shortness of breath. Negative for cough.   Cardiovascular: Negative for chest pain and palpitations.  Genitourinary: Negative for dysuria and hematuria.  Musculoskeletal: Positive for arthralgias and back pain. Negative for myalgias.  Skin: Negative for pallor and rash.  Neurological: Negative for weakness and  headaches.  Hematological: Negative for adenopathy.  Psychiatric/Behavioral:       Anxiety  Insomnia   Past Medical History: Past Medical History:  Diagnosis Date  . Acute bronchitis 09/25/2015  . ACUTE ON CHRONIC DIASTOLIC HEART FAILURE 12/04/2010   Qualifier: Diagnosis of  By: Ladona Ridgel, MD, [facility], Vergia Alcon   . Arthritis    OA RIGHT KNEE WITH PAIN  . Barrett esophagus   . Bradycardia 06/01/2015  . Chronic respiratory failure (HCC)   . Chronic respiratory failure with hypoxia and hypercapnia (HCC) 02/04/2010   Followed in Pulmonary clinic/ [facility]/ [place]       - 02 dependent  since 07/02/10 >>  83% RA December 05, 2010       - ONO RA 08/05/12  :  Positive sat < 89 x 2:80m> repeat on 2lpm rec 08/12/2012  - 06/17/2013 reported desat with activity p Knee surgery > rec restart 2lpm with activity  - 06/27/2013   Walked 2lpm  x one lap @ 185 stopped due to sat 88% not sob , desat to 82% on RA just at th  . COPD (chronic obstructive pulmonary disease) (HCC)   . COPD III spirometry if use FEV1/VC p saba  07/18/2010   Quit smoking May 2006       - PFT's  04/12/10 FEV1  1.21 (69%) ratio 77 and no change p B2,  DLC0 56%   VC 70%         - PFTs  08/08/2013 FEV1 1.21 (60%) ratio 86 and no change p B2 DLCO 79%  VC 72%  On symbicort 160 2bid  - PFT's  02/08/2018  FEV1 0.70 (40 % ) ratio 56 if use FEV1/VC  p 38 % improvement from saba p symb 160 prior to study with DLCO  78 % corrects to 147  % for alv volume   - 02/08/2018  . Cough variant asthma 02/26/2011   Followed in Pulmonary clinic/ [facility]/ [place]  - PFT's  06/04/15  FEV1 1.20 (67 % ) ratio 83  p 6 % improvement from saba with DLCO  80 % corrects to 132 % for alv volume      - Clinical dx based on response to symbicort       FENO 09/16/2016  =   96 on symbicort 160 2bid > added singulair  Allergy profile 09/16/2016 >  Eos 0.5 /  IgE  78 neg RAST  -  Referred to rehab 04/29/2017 > completed  . DOE (dyspnea on exertion) 02/19/2016  . Essential  hypertension 04/20/2007   Qualifier: Diagnosis of  By: Marcelyn Ditty RN, Katy Fitch   . GERD (gastroesophageal reflux disease)   . History of ARDS 2006  . History of home oxygen therapy    AT NIGHT WHEN SLEEPING 2 L / MIN NASAL CANNULA  . Hyperlipidemia 07/12/2015  . Hypertension   . Hypothyroidism   . Morbid (severe) obesity due to excess calories (HCC) 04/22/2015   pfts with erv 14% 06/04/15  And 33% 02/08/2018   . NSTEMI (non-ST elevated myocardial infarction) (HCC) 05/31/2015  . Pneumococcal pneumonia (HCC) 2006   HOSPITALIZED AND DEVELOPED ARDS  . Psoriatic arthritis (HCC)   . Pulmonary fibrosis (HCC)   . PULMONARY FIBROSIS ILD POST INFLAMMATORY CHRONIC 07/18/2010   Followed as Primary Care Patient/ [facility] [place]  -s/p ARDS 2006 with bacteremic S  Pna       - CT chest 07/03/10 Nonspecific PF mostly upper lobes       - CT chest 12/03/10 acute gg changes and effusions c/w chf - PFT's  02/08/2018  FVC 0.64 (28 %)   with DLCO  78 % corrects to 147 % for alv volume     . Rheumatic disease   . 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 SOB (shortness of breath) on exertion    She had ARDS from pneumonia in 2006  Past Surgical History: Past Surgical History:  Procedure Laterality Date  . ABDOMINOPLASTY    . CARDIAC CATHETERIZATION N/A 06/01/2015   Procedure: Left Heart Cath and Coronary Angiography;  Surgeon: Runell Gess, MD;  Location: [facility] INVASIVE CV LAB;  Service: Cardiovascular;  Laterality: N/A;  . CARPAL TUNNEL RELEASE    . CHOLECYSTECTOMY    . CORONARY ARTERY BYPASS GRAFT N/A 06/04/2015   Procedure: CORONARY ARTERY BYPASS GRAFT times three            with left internal mammary artery and right leg saphenous vein;  Surgeon: Alleen Borne, MD;  Location: [facility] OR;  Service: Open Heart Surgery;  Laterality: N/A;  . cosmetic breast surgery    . JOINT REPLACEMENT    . KNEE ARTHROSCOPY Left   . TEE WITHOUT CARDIOVERSION  06/04/2015   Procedure: TRANSESOPHAGEAL ECHOCARDIOGRAM (TEE);  Surgeon: Alleen Borne, MD;   Location: [facility] OR;  Service: Open Heart Surgery;;  . TOTAL KNEE ARTHROPLASTY Left   . TOTAL KNEE ARTHROPLASTY Right 06/06/2013   Procedure: RIGHT TOTAL KNEE ARTHROPLASTY;  Surgeon: Loanne Drilling, MD;  Location: [facility] ORS;  Service: Orthopedics;  Laterality:  Right;     Family History: Family History  Problem Relation Age of Onset  . Breast cancer Mother   . Coronary artery disease Father   . Rheum arthritis Father     Social History: Social History   Socioeconomic History  . Marital status: Married    Spouse name: Not on file  . Number of children: Not on file  . Years of education: Not on file  . Highest education level: Not on file  Occupational History  . 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 Financial resource strain: Not on file  . Food insecurity:    Worry: Not on file    Inability: Not on file  . Transportation needs:    Medical: Not on file    Non-medical: Not on file  Tobacco Use  . Smoking status: Former Smoker    Packs/day: 1.50    Years: 58.50    Pack years: 87.75    Types: Cigarettes    Last attempt to quit: 03/03/2005    Years since quitting: 13.4  . Smokeless tobacco: Never Used  Substance and Sexual Activity  . Alcohol use: No  . Drug use: No  . Sexual activity: Not on file  Lifestyle  . Physical activity:    Days per week: Not on file    Minutes per session: Not on file  . Stress: Not on file  Relationships  . Social connections:    Talks on phone: Not on file    Gets together: Not on file    Attends religious service: Not on file    Active member of club or organization: Not on file    Attends meetings of clubs or organizations: Not on file    Relationship status: Not on file  Other Topics Concern  . Not on file  Social History Narrative  . Not on file    Allergies: No Known Allergies  Outpatient Meds: Current Outpatient Medications  Medication Sig Dispense Refill  . acetaminophen (TYLENOL ARTHRITIS  PAIN) 650 MG CR tablet Take 1,300 mg by mouth every 8 (eight) hours as needed for pain. Per bottle directions as needed    . albuterol (PROAIR HFA) 108 (90 Base) MCG/ACT inhaler 2 puffs every 4 hours as needed only  if your can't catch your breath 1 Inhaler 11  . aspirin EC 81 MG tablet Take 1 tablet (81 mg total) by mouth daily. 90 tablet 3  . atorvastatin (LIPITOR) 80 MG tablet TAKE 1 TABLET BY MOUTH  DAILY AT 6 PM. 90 tablet 2  . Cholecalciferol (VITAMIN D3) 2000 UNITS TABS Take 2,000 Int'l Units by mouth daily.    . citalopram (CELEXA) 40 MG tablet TAKE 1 TABLET BY MOUTH  EVERY MORNING 90 tablet 0  . famotidine (PEPCID) 20 MG tablet One at bedtime    . furosemide (LASIX) 20 MG tablet TAKE 1 TABLET BY MOUTH TWO  TIMES DAILY (Patient taking differently: Take 20 mg by mouth daily. ) 180 tablet 2  . Golimumab (SIMPONI ARIA IV) Inject into the vein every 8 (eight) weeks.    Marland Kitchen levothyroxine (SYNTHROID, LEVOTHROID) 150 MCG tablet TAKE 1 TABLET BY MOUTH  DAILY BEFORE BREAKFAST 90 tablet 2  . losartan (COZAAR) 25 MG tablet Take 1 tablet (25 mg total) by mouth daily. 90 tablet 3  . montelukast (SINGULAIR) 10 MG tablet TAKE 1 TABLET BY MOUTH AT  BEDTIME 90 tablet 3  . NON FORMULARY 2lpm 24/7    .  OXYGEN Inhale into the lungs. continuous o2    . pantoprazole (PROTONIX) 40 MG tablet TAKE 1 TABLET BY MOUTH  DAILY 30 TO 60 MINUTES  BEFORE FIRST MEAL OF THE  DAY 90 tablet 0  . SYMBICORT 160-4.5 MCG/ACT inhaler USE 2 PUFFS TWO TIMES DAILY 30.6 g 1  . traMADol (ULTRAM) 50 MG tablet TAKE 1-2 TABLETS EVERY 6 HOURS AS NEEDED FOR PAIN 90 tablet 0  . triamcinolone cream (KENALOG) 0.1 % Apply 1 application topically 2 (two) times daily as needed (for psorisis).     No current facility-administered medications for this visit.       ___________________________________________________________________ Objective   Exam:  BP 140/64   Pulse 68   Ht 4\' 10"  (1.473 m)   Wt 206 lb 3.2 oz (93.5 kg)   SpO2 91%    BMI 43.10 kg/m  Chaperoned by Elon Spanner, MA  General: this is a(n) chronically ill-appearing woman mildly dyspneic at rest wearing supplemental oxygen.  Eyes: sclera anicteric, no redness  ENT: oral mucosa moist without lesions, no cervical or supraclavicular lymphadenopathy, good dentition  CV: RRR without murmur, S1/S2, no JVD, no peripheral edema  Resp: Decreased inspiratory breath sounds, and wheezing bilaterally, normal RR and effort noted  GI: soft, no tenderness, with active bowel sounds. No guarding or palpable organomegaly noted, limited by body habitus.  Skin; warm and dry, no rash or jaundice noted  Neuro: awake, alert and oriented x 3. Normal gross motor function and fluent speech Peri-anal psoriasis, o/w neg perianal and rectal exam - stool heme negative  Labs:  CBC Latest Ref Rng & Units 06/23/2018 11/09/2017 09/16/2016  WBC 3.4 - 10.8 x10E3/uL 9.7 9.6 8.7  Hemoglobin 11.1 - 15.9 g/dL 70.9 62.8 36.6  Hematocrit 34.0 - 46.6 % 38.9 36.6 36.6  Platelets 150 - 450 x10E3/uL 199 196 221.0   CMP Latest Ref Rng & Units 06/23/2018 11/09/2017 09/29/2016  Glucose 65 - 99 mg/dL 294(T) 654(Y) 81  BUN 8 - 27 mg/dL 16 13 50(P)  Creatinine 0.57 - 1.00 mg/dL 5.46 5.68 1.27  Sodium 134 - 144 mmol/L 142 143 141  Potassium 3.5 - 5.2 mmol/L 4.9 4.1 4.5  Chloride 96 - 106 mmol/L 97 94(L) 99  CO2 20 - 29 mmol/L 31(H) 31(H) 34(H)  Calcium 8.7 - 10.3 mg/dL 51.7 00.1 9.9  Total Protein 6.0 - 8.5 g/dL 7.2 7.5 7.2  Total Bilirubin 0.0 - 1.2 mg/dL 0.3 0.3 0.4  Alkaline Phos 39 - 117 IU/L 80 70 57  AST 0 - 40 IU/L 18 18 17   ALT 0 - 32 IU/L 13 15 15      Assessment: Encounter Diagnoses  Name Primary?  . Chronic diarrhea Yes  . Tenesmus     Because of diarrhea and rectal pressure is unclear at present.  She is so many years out from cholecystectomy it is unlikely to be bile acid diarrhea.  She is also been on her ARB for many years.  Microscopic colitis is a consideration, however she  would be high risk for sedation needed for colonoscopy or sigmoidoscopy. Although there seems to be some anxiety component to it, its age of onset seems to make this less likely.  She also has severe perianal psoriasis, which she felt was probably the case but says it had not previously been examined by a rheumatologist or dermatologist.  This seems to explain her perianal symptoms, and on occasion psoriasis can extend into the rectum as well.  While that might cause the  rectal pressure and discomfort, it seems unlikely to be the source of diarrhea.  I hope that a conservative approach will be successful for her.  Plan:  Stool for C. difficile PCR and ova and parasite As needed use of Imodium, which I think is a lower likelihood of side effects than dicyclomine in elderly patients. I would like to hear from her within a few weeks.  If not much improvement, we can pursue a sigmoidoscopy or colonoscopy.  I had an initial discussion with her about this, and particularly her high risk for sedation.  It would certainly have to be performed at the hospital outpatient endoscopy lab.  Thank you for the courtesy of this consult.  Please call me with any questions or concerns.  Charlie Pitter III  CC: Gordy Savers, MD

## 2018-08-11 NOTE — Patient Instructions (Signed)
Your provider has requested that you go to the basement level for lab work before leaving today. Press "B" on the elevator. The lab is located at the first door on the left as you exit the elevator.  Stool studies for C-Diff and ova and parasite   Please take imodium (purchase over the counter).  Take one tablet in the am.  You may increase the dose as needed for diarrhea.

## 2018-08-13 ENCOUNTER — Other Ambulatory Visit: Payer: Medicare Other

## 2018-08-13 DIAGNOSIS — K529 Noninfective gastroenteritis and colitis, unspecified: Secondary | ICD-10-CM | POA: Diagnosis not present

## 2018-08-13 DIAGNOSIS — R198 Other specified symptoms and signs involving the digestive system and abdomen: Secondary | ICD-10-CM

## 2018-08-17 ENCOUNTER — Ambulatory Visit
Admission: RE | Admit: 2018-08-17 | Discharge: 2018-08-17 | Disposition: A | Discharge status: Home / Self Care | Payer: Medicare Other | Source: Ambulatory Visit | Attending: Internal Medicine | Admitting: Internal Medicine

## 2018-08-17 DIAGNOSIS — Z780 Asymptomatic menopausal state: Secondary | ICD-10-CM | POA: Diagnosis not present

## 2018-08-17 DIAGNOSIS — M85852 Other specified disorders of bone density and structure, left thigh: Secondary | ICD-10-CM | POA: Diagnosis not present

## 2018-08-17 DIAGNOSIS — E2839 Other primary ovarian failure: Secondary | ICD-10-CM

## 2018-08-17 LAB — OVA AND PARASITE EXAMINATION
CONCENTRATE RESULT:: NONE SEEN
MICRO NUMBER:: 91225617
SPECIMEN QUALITY:: ADEQUATE
TRICHROME RESULT:: NONE SEEN

## 2018-08-17 LAB — CLOSTRIDIUM DIFFICILE TOXIN B, QUALITATIVE, REAL-TIME PCR: Toxigenic C. Difficile by PCR: NOT DETECTED

## 2018-08-23 ENCOUNTER — Ambulatory Visit: Payer: Self-pay | Admitting: Family Medicine

## 2018-08-24 DIAGNOSIS — M0589 Other rheumatoid arthritis with rheumatoid factor of multiple sites: Secondary | ICD-10-CM | POA: Diagnosis not present

## 2018-08-24 DIAGNOSIS — Z79899 Other long term (current) drug therapy: Secondary | ICD-10-CM | POA: Diagnosis not present

## 2018-08-25 ENCOUNTER — Encounter (HOSPITAL_COMMUNITY): Payer: Self-pay | Admitting: Emergency Medicine

## 2018-08-25 ENCOUNTER — Other Ambulatory Visit: Payer: Self-pay

## 2018-08-25 ENCOUNTER — Inpatient Hospital Stay (HOSPITAL_COMMUNITY)
Admission: EM | Admit: 2018-08-25 | Discharge: 2018-08-27 | DRG: 065 | Disposition: A | Discharge status: Home / Self Care | Payer: Medicare Other | Attending: Neurology | Admitting: Neurology

## 2018-08-25 ENCOUNTER — Emergency Department (HOSPITAL_COMMUNITY): Payer: Medicare Other

## 2018-08-25 DIAGNOSIS — J8410 Pulmonary fibrosis, unspecified: Secondary | ICD-10-CM | POA: Diagnosis present

## 2018-08-25 DIAGNOSIS — E782 Mixed hyperlipidemia: Secondary | ICD-10-CM | POA: Diagnosis not present

## 2018-08-25 DIAGNOSIS — R7989 Other specified abnormal findings of blood chemistry: Secondary | ICD-10-CM

## 2018-08-25 DIAGNOSIS — I5032 Chronic diastolic (congestive) heart failure: Secondary | ICD-10-CM | POA: Diagnosis present

## 2018-08-25 DIAGNOSIS — I252 Old myocardial infarction: Secondary | ICD-10-CM | POA: Diagnosis not present

## 2018-08-25 DIAGNOSIS — R198 Other specified symptoms and signs involving the digestive system and abdomen: Secondary | ICD-10-CM

## 2018-08-25 DIAGNOSIS — J9611 Chronic respiratory failure with hypoxia: Secondary | ICD-10-CM | POA: Diagnosis present

## 2018-08-25 DIAGNOSIS — I2510 Atherosclerotic heart disease of native coronary artery without angina pectoris: Secondary | ICD-10-CM | POA: Diagnosis present

## 2018-08-25 DIAGNOSIS — J449 Chronic obstructive pulmonary disease, unspecified: Secondary | ICD-10-CM | POA: Diagnosis present

## 2018-08-25 DIAGNOSIS — R001 Bradycardia, unspecified: Secondary | ICD-10-CM | POA: Diagnosis present

## 2018-08-25 DIAGNOSIS — I161 Hypertensive emergency: Secondary | ICD-10-CM | POA: Diagnosis not present

## 2018-08-25 DIAGNOSIS — M0589 Other rheumatoid arthritis with rheumatoid factor of multiple sites: Secondary | ICD-10-CM | POA: Diagnosis not present

## 2018-08-25 DIAGNOSIS — M8580 Other specified disorders of bone density and structure, unspecified site: Secondary | ICD-10-CM | POA: Diagnosis not present

## 2018-08-25 DIAGNOSIS — Z79890 Hormone replacement therapy (postmenopausal): Secondary | ICD-10-CM | POA: Diagnosis not present

## 2018-08-25 DIAGNOSIS — I629 Nontraumatic intracranial hemorrhage, unspecified: Secondary | ICD-10-CM

## 2018-08-25 DIAGNOSIS — Z96653 Presence of artificial knee joint, bilateral: Secondary | ICD-10-CM | POA: Diagnosis present

## 2018-08-25 DIAGNOSIS — M069 Rheumatoid arthritis, unspecified: Secondary | ICD-10-CM | POA: Diagnosis present

## 2018-08-25 DIAGNOSIS — Z79891 Long term (current) use of opiate analgesic: Secondary | ICD-10-CM | POA: Diagnosis not present

## 2018-08-25 DIAGNOSIS — I619 Nontraumatic intracerebral hemorrhage, unspecified: Secondary | ICD-10-CM | POA: Diagnosis present

## 2018-08-25 DIAGNOSIS — Z9981 Dependence on supplemental oxygen: Secondary | ICD-10-CM

## 2018-08-25 DIAGNOSIS — Z7982 Long term (current) use of aspirin: Secondary | ICD-10-CM | POA: Diagnosis not present

## 2018-08-25 DIAGNOSIS — K219 Gastro-esophageal reflux disease without esophagitis: Secondary | ICD-10-CM | POA: Diagnosis present

## 2018-08-25 DIAGNOSIS — K6289 Other specified diseases of anus and rectum: Secondary | ICD-10-CM

## 2018-08-25 DIAGNOSIS — R410 Disorientation, unspecified: Secondary | ICD-10-CM | POA: Diagnosis not present

## 2018-08-25 DIAGNOSIS — Z8261 Family history of arthritis: Secondary | ICD-10-CM | POA: Diagnosis not present

## 2018-08-25 DIAGNOSIS — E785 Hyperlipidemia, unspecified: Secondary | ICD-10-CM | POA: Diagnosis not present

## 2018-08-25 DIAGNOSIS — Z79899 Other long term (current) drug therapy: Secondary | ICD-10-CM

## 2018-08-25 DIAGNOSIS — L409 Psoriasis, unspecified: Secondary | ICD-10-CM | POA: Diagnosis not present

## 2018-08-25 DIAGNOSIS — M150 Primary generalized (osteo)arthritis: Secondary | ICD-10-CM | POA: Diagnosis not present

## 2018-08-25 DIAGNOSIS — I618 Other nontraumatic intracerebral hemorrhage: Principal | ICD-10-CM | POA: Diagnosis present

## 2018-08-25 DIAGNOSIS — Z6841 Body Mass Index (BMI) 40.0 and over, adult: Secondary | ICD-10-CM

## 2018-08-25 DIAGNOSIS — I10 Essential (primary) hypertension: Secondary | ICD-10-CM

## 2018-08-25 DIAGNOSIS — Z7951 Long term (current) use of inhaled steroids: Secondary | ICD-10-CM

## 2018-08-25 DIAGNOSIS — I110 Hypertensive heart disease with heart failure: Secondary | ICD-10-CM | POA: Diagnosis present

## 2018-08-25 DIAGNOSIS — J9612 Chronic respiratory failure with hypercapnia: Secondary | ICD-10-CM | POA: Diagnosis present

## 2018-08-25 DIAGNOSIS — Z951 Presence of aortocoronary bypass graft: Secondary | ICD-10-CM

## 2018-08-25 DIAGNOSIS — R29700 NIHSS score 0: Secondary | ICD-10-CM | POA: Diagnosis present

## 2018-08-25 DIAGNOSIS — I610 Nontraumatic intracerebral hemorrhage in hemisphere, subcortical: Secondary | ICD-10-CM

## 2018-08-25 DIAGNOSIS — E039 Hypothyroidism, unspecified: Secondary | ICD-10-CM | POA: Diagnosis present

## 2018-08-25 DIAGNOSIS — Z9049 Acquired absence of other specified parts of digestive tract: Secondary | ICD-10-CM

## 2018-08-25 DIAGNOSIS — Z87891 Personal history of nicotine dependence: Secondary | ICD-10-CM

## 2018-08-25 DIAGNOSIS — R197 Diarrhea, unspecified: Secondary | ICD-10-CM

## 2018-08-25 DIAGNOSIS — I5030 Unspecified diastolic (congestive) heart failure: Secondary | ICD-10-CM | POA: Diagnosis not present

## 2018-08-25 DIAGNOSIS — E032 Hypothyroidism due to medicaments and other exogenous substances: Secondary | ICD-10-CM

## 2018-08-25 LAB — COMPREHENSIVE METABOLIC PANEL
ALT: 15 U/L (ref 0–44)
AST: 21 U/L (ref 15–41)
Albumin: 3.7 g/dL (ref 3.5–5.0)
Alkaline Phosphatase: 64 U/L (ref 38–126)
Anion gap: 7 (ref 5–15)
BUN: 23 mg/dL (ref 8–23)
CO2: 33 mmol/L — ABNORMAL HIGH (ref 22–32)
Calcium: 9.7 mg/dL (ref 8.9–10.3)
Chloride: 98 mmol/L (ref 98–111)
Creatinine, Ser: 1.02 mg/dL — ABNORMAL HIGH (ref 0.44–1.00)
GFR calc Af Amer: >60 mL/min (ref >60)
GFR calc non Af Amer: 54 mL/min — ABNORMAL LOW (ref >60)
Glucose, Bld: 169 mg/dL — ABNORMAL HIGH (ref 70–99)
Potassium: 3.9 mmol/L (ref 3.5–5.1)
Sodium: 138 mmol/L (ref 135–145)
Total Bilirubin: 0.5 mg/dL (ref 0.3–1.2)
Total Protein: 7.1 g/dL (ref 6.5–8.1)

## 2018-08-25 LAB — DIFFERENTIAL
Abs Immature Granulocytes: 0.03 10*3/uL (ref 0.00–0.07)
Basophils Absolute: 0.1 10*3/uL (ref 0.0–0.1)
Basophils Relative: 1 %
Eosinophils Absolute: 0.6 10*3/uL — ABNORMAL HIGH (ref 0.0–0.5)
Eosinophils Relative: 7 %
Immature Granulocytes: 0 %
Lymphocytes Relative: 24 %
Lymphs Abs: 2.2 10*3/uL (ref 0.7–4.0)
Monocytes Absolute: 0.7 10*3/uL (ref 0.1–1.0)
Monocytes Relative: 8 %
Neutro Abs: 5.7 10*3/uL (ref 1.7–7.7)
Neutrophils Relative %: 60 %

## 2018-08-25 LAB — I-STAT CHEM 8, ED
BUN: 25 mg/dL — ABNORMAL HIGH (ref 8–23)
Calcium, Ion: 1.2 mmol/L (ref 1.15–1.40)
Chloride: 97 mmol/L — ABNORMAL LOW (ref 98–111)
Creatinine, Ser: 1.1 mg/dL — ABNORMAL HIGH (ref 0.44–1.00)
Glucose, Bld: 167 mg/dL — ABNORMAL HIGH (ref 70–99)
HCT: 38 % (ref 36.0–46.0)
Hemoglobin: 12.9 g/dL (ref 12.0–15.0)
Potassium: 3.9 mmol/L (ref 3.5–5.1)
Sodium: 139 mmol/L (ref 135–145)
TCO2: 35 mmol/L — ABNORMAL HIGH (ref 22–32)

## 2018-08-25 LAB — CBC
HCT: 39.7 % (ref 36.0–46.0)
Hemoglobin: 11.4 g/dL — ABNORMAL LOW (ref 12.0–15.0)
MCH: 27.2 pg (ref 26.0–34.0)
MCHC: 28.7 g/dL — ABNORMAL LOW (ref 30.0–36.0)
MCV: 94.7 fL (ref 80.0–100.0)
Platelets: 230 10*3/uL (ref 150–400)
RBC: 4.19 MIL/uL (ref 3.87–5.11)
RDW: 14.2 % (ref 11.5–15.5)
WBC: 9.3 10*3/uL (ref 4.0–10.5)
nRBC: 0 % (ref 0.0–0.2)

## 2018-08-25 LAB — LIPID PANEL
Cholesterol: 133 mg/dL (ref 0–200)
HDL: 59 mg/dL (ref >40)
LDL Cholesterol: 52 mg/dL (ref 0–99)
Total CHOL/HDL Ratio: 2.3 RATIO
Triglycerides: 108 mg/dL (ref <150)
VLDL: 22 mg/dL (ref 0–40)

## 2018-08-25 LAB — APTT: aPTT: 29 seconds (ref 24–36)

## 2018-08-25 LAB — PROTIME-INR
INR: 0.96
INR: 0.96
Prothrombin Time: 12.7 seconds (ref 11.4–15.2)
Prothrombin Time: 12.7 seconds (ref 11.4–15.2)

## 2018-08-25 LAB — HEMOGLOBIN A1C
Hgb A1c MFr Bld: 5.3 % (ref 4.8–5.6)
Mean Plasma Glucose: 105.41 mg/dL

## 2018-08-25 LAB — I-STAT TROPONIN, ED: Troponin i, poc: 0.02 ng/mL (ref 0.00–0.08)

## 2018-08-25 MED ORDER — ACETAMINOPHEN 160 MG/5ML PO SOLN: 650.0000 mg | ORAL | Status: DC | PRN

## 2018-08-25 MED ORDER — PANTOPRAZOLE SODIUM 40 MG PO TBEC
40.0000 mg | DELAYED_RELEASE_TABLET | Freq: Every day | ORAL | Status: DC
  Administered 2018-08-25 – 2018-08-27 (×3): 40 mg via ORAL
  Filled 2018-08-25 (×3): qty 1

## 2018-08-25 MED ORDER — ALBUTEROL SULFATE HFA 108 (90 BASE) MCG/ACT IN AERS: 2.0000 | INHALATION_SPRAY | RESPIRATORY_TRACT | Status: DC | PRN

## 2018-08-25 MED ORDER — MONTELUKAST SODIUM 10 MG PO TABS
10.0000 mg | ORAL_TABLET | Freq: Every day | ORAL | Status: DC
  Administered 2018-08-25 – 2018-08-26 (×2): 10 mg via ORAL
  Filled 2018-08-25 (×3): qty 1

## 2018-08-25 MED ORDER — LABETALOL HCL 5 MG/ML IV SOLN
20.0000 mg | Freq: Once | INTRAVENOUS | Status: DC
  Filled 2018-08-25: qty 4

## 2018-08-25 MED ORDER — FUROSEMIDE 20 MG PO TABS
20.0000 mg | ORAL_TABLET | Freq: Every day | ORAL | Status: DC
  Administered 2018-08-26 – 2018-08-27 (×2): 20 mg via ORAL
  Filled 2018-08-25 (×2): qty 1

## 2018-08-25 MED ORDER — CLEVIDIPINE BUTYRATE 0.5 MG/ML IV EMUL
0.0000 mg/h | INTRAVENOUS | Status: DC
  Administered 2018-08-25 (×2): 21 mg/h via INTRAVENOUS
  Administered 2018-08-25: 20 mg/h via INTRAVENOUS
  Administered 2018-08-25: 1 mg/h via INTRAVENOUS
  Filled 2018-08-25 (×4): qty 50

## 2018-08-25 MED ORDER — SENNOSIDES-DOCUSATE SODIUM 8.6-50 MG PO TABS
1.0000 | ORAL_TABLET | Freq: Two times a day (BID) | ORAL | Status: DC
  Administered 2018-08-25 – 2018-08-27 (×2): 1 via ORAL
  Filled 2018-08-25 (×2): qty 1

## 2018-08-25 MED ORDER — ACETAMINOPHEN 325 MG PO TABS: 650.0000 mg | ORAL_TABLET | ORAL | Status: DC | PRN

## 2018-08-25 MED ORDER — LEVOTHYROXINE SODIUM 75 MCG PO TABS
75.0000 ug | ORAL_TABLET | Freq: Every day | ORAL | Status: DC
  Administered 2018-08-26 – 2018-08-27 (×2): 75 ug via ORAL
  Filled 2018-08-25 (×2): qty 1

## 2018-08-25 MED ORDER — MOMETASONE FURO-FORMOTEROL FUM 200-5 MCG/ACT IN AERO
2.0000 | INHALATION_SPRAY | Freq: Two times a day (BID) | RESPIRATORY_TRACT | Status: DC
  Administered 2018-08-25 – 2018-08-27 (×3): 2 via RESPIRATORY_TRACT
  Filled 2018-08-25: qty 8.8

## 2018-08-25 MED ORDER — ACETAMINOPHEN 650 MG RE SUPP: 650.0000 mg | RECTAL | Status: DC | PRN

## 2018-08-25 MED ORDER — CITALOPRAM HYDROBROMIDE 10 MG PO TABS
40.0000 mg | ORAL_TABLET | Freq: Every morning | ORAL | Status: DC
  Administered 2018-08-26 – 2018-08-27 (×2): 40 mg via ORAL
  Filled 2018-08-25 (×2): qty 4

## 2018-08-25 MED ORDER — ACETAMINOPHEN 500 MG PO TABS
1000.0000 mg | ORAL_TABLET | Freq: Four times a day (QID) | ORAL | Status: DC | PRN
  Administered 2018-08-25 – 2018-08-26 (×2): 1000 mg via ORAL
  Filled 2018-08-25 (×2): qty 2

## 2018-08-25 MED ORDER — LABETALOL HCL 5 MG/ML IV SOLN
10.0000 mg | INTRAVENOUS | Status: DC | PRN
  Administered 2018-08-26: 10 mg via INTRAVENOUS
  Filled 2018-08-25: qty 4

## 2018-08-25 MED ORDER — ALBUTEROL SULFATE (2.5 MG/3ML) 0.083% IN NEBU: 2.5000 mg | INHALATION_SOLUTION | RESPIRATORY_TRACT | Status: DC | PRN

## 2018-08-25 MED ORDER — STROKE: EARLY STAGES OF RECOVERY BOOK
Freq: Once | Status: AC
  Administered 2018-08-26: 03:00:00
  Filled 2018-08-25 (×2): qty 1

## 2018-08-25 NOTE — Progress Notes (Signed)
   08/25/18 2200  Provider Notification  Provider Name/Title Dr. Wilford Corner  Date Provider Notified 08/25/18  Time Provider Notified 2200  Notification Type Page  Notification Reason Requested by patient/family (Regarding meds and diet)  Response See new orders (New diet order and tylenol changed)  Date of Provider Response 08/25/18  Time of Provider Response 2208

## 2018-08-25 NOTE — H&P (Addendum)
STROKE H&P  CC: ICH  History is obtained from: Chart, patient, patient's daughter at the bedside  HPI: [name] is a 72 y.o. female was a past medical history of hypertension, COPD, tobacco abuse quit smoking 2006, coronary artery disease with history of CABG, pulmonary fibrosis hypothyroidism, obesity, rheumatoid arthritis, psoriatic arthritis, osteoarthritis who was in her usual state of health about 1 to 2 days ago when she started noticing some headaches and some abnormal symptoms which were out of character for her.  She was attempting to drive to her doctor's office and could not remember how to get there although she is doing to the primary care's office multiple times.  Today, she went to the bank and completed a transaction at the bank and could not remember how to get out of the bank. This is very atypical for her and was concerning enough for the family to bring her to the emergency room. In the emergency room, noncontrast CT of the head was performed that showed a small area of hemorrhage in the right splenium of the corpus callosum of about 2 cc with mild associated edema and mass-effect locally. Neurological consultation was obtained for admission for further work-up. Patient denies any chronic headaches but reports that over the past 2 to 3 weeks has been having some headaches.  She also has been having issues with her blood pressure where her blood pressures became very low and her doctors changed her antihypertensives and half the doses.  She denies any focal neurological deficits over the past few days to weeks.  She denies any prior history of strokes.  She denies any visual symptoms.  Denies any chest pain but reports continuous ongoing shortness of breath more so on exertion and laying down.   LKW: 1 to 2 days ago tpa given?: no, ICH Premorbid modified Rankin scale (mRS): 2-due to COPD and dyspnea on exertion  ROS: ROS was performed and is negative except as noted in  the HPI.    Past Medical History:  Diagnosis Date  . Acute bronchitis 09/25/2015  . ACUTE ON CHRONIC DIASTOLIC HEART FAILURE 12/04/2010   Qualifier: Diagnosis of  By: Ladona Ridgel, MD, [facility], [place]   . Arthritis    OA RIGHT KNEE WITH PAIN  . Barrett esophagus   . Bradycardia 06/01/2015  . Chronic respiratory failure (HCC)   . Chronic respiratory failure with hypoxia and hypercapnia (HCC) 02/04/2010   Followed in Pulmonary clinic/ [facility]/ [facility]       - 02 dependent  since 07/02/10 >>  83% RA December 05, 2010       - ONO RA 08/05/12  :  Positive sat < 89 x 2:66m> repeat on 2lpm rec 08/12/2012  - 06/17/2013 reported desat with activity p Knee surgery > rec restart 2lpm with activity  - 06/27/2013   Walked 2lpm  x one lap @ 185 stopped due to sat 88% not sob , desat to 82% on RA just at th  . COPD (chronic obstructive pulmonary disease) (HCC)   . COPD III spirometry if use FEV1/VC p saba  07/18/2010   Quit smoking May 2006       - PFT's  04/12/10 FEV1  1.21 (69%) ratio 77 and no change p B2,  DLC0 56%   VC 70%         - PFTs  08/08/2013 FEV1 1.21 (60%) ratio 86 and no change p B2 DLCO 79%  VC 72%  On symbicort 160 2bid  -  PFT's  02/08/2018  FEV1 0.70 (40 % ) ratio 56 if use FEV1/VC  p 38 % improvement from saba p symb 160 prior to study with DLCO  78 % corrects to 147  % for alv volume   - 02/08/2018  . Cough variant asthma 02/26/2011   Followed in Pulmonary clinic/ [facility]/ [facility]  - PFT's  06/04/15  FEV1 1.20 (67 % ) ratio 83  p 6 % improvement from saba with DLCO  80 % corrects to 132 % for alv volume      - Clinical dx based on response to symbicort       FENO 09/16/2016  =   96 on symbicort 160 2bid > added singulair  Allergy profile 09/16/2016 >  Eos 0.5 /  IgE  78 neg RAST  -  Referred to rehab 04/29/2017 > completed  . DOE (dyspnea on exertion) 02/19/2016  . Essential hypertension 04/20/2007   Qualifier: Diagnosis of  By: Marcelyn Ditty RN, Katy Fitch   . GERD (gastroesophageal reflux disease)   .  History of ARDS 2006  . History of home oxygen therapy    AT NIGHT WHEN SLEEPING 2 L / MIN NASAL CANNULA  . Hyperlipidemia 07/12/2015  . Hypertension   . Hypothyroidism   . Morbid (severe) obesity due to excess calories (HCC) 04/22/2015   pfts with erv 14% 06/04/15  And 33% 02/08/2018   . NSTEMI (non-ST elevated myocardial infarction) (HCC) 05/31/2015  . Pneumococcal pneumonia (HCC) 2006   HOSPITALIZED AND DEVELOPED ARDS  . Psoriatic arthritis (HCC)   . Pulmonary fibrosis (HCC)   . PULMONARY FIBROSIS ILD POST INFLAMMATORY CHRONIC 07/18/2010   Followed as Primary Care Patient/ [facility]/ [facility]  -s/p ARDS 2006 with bacteremic S  Pna       - CT chest 07/03/10 Nonspecific PF mostly upper lobes       - CT chest 12/03/10 acute gg changes and effusions c/w chf - PFT's  02/08/2018  FVC 0.64 (28 %)   with DLCO  78 % corrects to 147 % for alv volume     . Rheumatic disease   . 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 SOB (shortness of breath) on exertion     Family History  Problem Relation Age of Onset  . Breast cancer Mother   . Coronary artery disease Father   . Rheum arthritis Father    Social History:   reports that she quit smoking about 13 years ago. Her smoking use included cigarettes. She has a 87.75 pack-year smoking history. She has never used smokeless tobacco. She reports that she does not drink alcohol or use drugs.  Medications  Current Facility-Administered Medications:  .   stroke: mapping our early stages of recovery book, , Does not apply, Once, Milon Dikes, MD .  acetaminophen (TYLENOL) tablet 650 mg, 650 mg, Oral, Q4H PRN **OR** acetaminophen (TYLENOL) solution 650 mg, 650 mg, Per Tube, Q4H PRN **OR** acetaminophen (TYLENOL) suppository 650 mg, 650 mg, Rectal, Q4H PRN, Milon Dikes, MD .  clevidipine (CLEVIPREX) infusion 0.5 mg/mL, 0-21 mg/hr, Intravenous, Continuous, Criss Alvine, Scott, MD .  pantoprazole (PROTONIX) EC tablet 40 mg, 40 mg, Oral, Daily, Milon Dikes, MD .  senna-docusate  (Senokot-S) tablet 1 tablet, 1 tablet, Oral, BID, Milon Dikes, MD  Current Outpatient Medications:  .  acetaminophen (TYLENOL ARTHRITIS PAIN) 650 MG CR tablet, Take 1,300 mg by mouth every 8 (eight) hours as needed for pain. Per bottle directions as needed, Disp: , Rfl:  .  albuterol (PROAIR HFA) 108 (90 Base) MCG/ACT inhaler, 2 puffs every 4 hours as needed only  if your can't catch your breath, Disp: 1 Inhaler, Rfl: 11 .  aspirin EC 81 MG tablet, Take 1 tablet (81 mg total) by mouth daily., Disp: 90 tablet, Rfl: 3 .  atorvastatin (LIPITOR) 80 MG tablet, TAKE 1 TABLET BY MOUTH  DAILY AT 6 PM., Disp: 90 tablet, Rfl: 2 .  Cholecalciferol (VITAMIN D3) 2000 UNITS TABS, Take 2,000 Int'l Units by mouth daily., Disp: , Rfl:  .  citalopram (CELEXA) 40 MG tablet, TAKE 1 TABLET BY MOUTH  EVERY MORNING, Disp: 90 tablet, Rfl: 0 .  famotidine (PEPCID) 20 MG tablet, One at bedtime, Disp: , Rfl:  .  furosemide (LASIX) 20 MG tablet, TAKE 1 TABLET BY MOUTH TWO  TIMES DAILY (Patient taking differently: Take 20 mg by mouth daily. ), Disp: 180 tablet, Rfl: 2 .  Golimumab (SIMPONI ARIA IV), Inject into the vein every 8 (eight) weeks., Disp: , Rfl:  .  levothyroxine (SYNTHROID, LEVOTHROID) 150 MCG tablet, TAKE 1 TABLET BY MOUTH  DAILY BEFORE BREAKFAST, Disp: 90 tablet, Rfl: 2 .  losartan (COZAAR) 25 MG tablet, Take 1 tablet (25 mg total) by mouth daily., Disp: 90 tablet, Rfl: 3 .  montelukast (SINGULAIR) 10 MG tablet, TAKE 1 TABLET BY MOUTH AT  BEDTIME, Disp: 90 tablet, Rfl: 3 .  NON FORMULARY, 2lpm 24/7, Disp: , Rfl:  .  OXYGEN, Inhale into the lungs. continuous o2, Disp: , Rfl:  .  pantoprazole (PROTONIX) 40 MG tablet, TAKE 1 TABLET BY MOUTH  DAILY 30 TO 60 MINUTES  BEFORE FIRST MEAL OF THE  DAY, Disp: 90 tablet, Rfl: 0 .  SYMBICORT 160-4.5 MCG/ACT inhaler, USE 2 PUFFS TWO TIMES DAILY, Disp: 30.6 g, Rfl: 1 .  traMADol (ULTRAM) 50 MG tablet, TAKE 1-2 TABLETS EVERY 6 HOURS AS NEEDED FOR PAIN, Disp: 90 tablet, Rfl:  0 .  triamcinolone cream (KENALOG) 0.1 %, Apply 1 application topically 2 (two) times daily as needed (for psorisis)., Disp: , Rfl:  Exam: Current vital signs: BP (!) 153/69   Pulse 91   Temp 99.3 F (37.4 C) (Oral)   Resp 18   SpO2 97%  Vital signs in last 24 hours: Temp:  [99.3 F (37.4 C)] 99.3 F (37.4 C) (10/23 1722) Pulse Rate:  [88-94] 91 (10/23 1930) Resp:  [18] 18 (10/23 1722) BP: (153-162)/(65-86) 153/69 (10/23 1930) SpO2:  [97 %-98 %] 97 % (10/23 1930) GENERAL: Very pleasant woman who is awake, alert in NAD HEENT: - Normocephalic and atraumatic, dry mm, no LN++, no Thyromegally LUNGS - Clear to auscultation bilaterally with no wheezes CV - S1S2 RRR, no m/r/g, equal pulses bilaterally. ABDOMEN - Soft, nontender, nondistended with normoactive BS Ext: warm, well perfused, intact peripheral pulses, no edema  NEURO:  Mental Status: AA&Ox3  Language: speech is non-dysarthric.  Naming, repetition, fluency, and comprehension intact. Cranial Nerves: PERRL. EOMI, visual fields full, no facial asymmetry, facial sensation intact, hearing intact, tongue/uvula/soft palate midline, normal sternocleidomastoid and trapezius muscle strength. No evidence of tongue atrophy or fibrillations Motor: 5/5 in all 4 extremities with mild limitation in range of movements due to arthritis in multiple joints. Tone: is normal and bulk is normal Sensation- Intact to light touch bilaterally, no extinction on double Samtani stim elation. Coordination: FTN intact bilaterally Gait- deferred  NIHSS-0  ICH score-0   Labs I have reviewed labs in epic and the results pertinent to this consultation are: CBC  Component Value Date/Time   WBC 9.3 08/25/2018 1759   RBC 4.19 08/25/2018 1759   HGB 12.9 08/25/2018 1808   HGB 11.9 06/23/2018 1559   HCT 38.0 08/25/2018 1808   HCT 38.9 06/23/2018 1559   PLT 230 08/25/2018 1759   PLT 199 06/23/2018 1559   MCV 94.7 08/25/2018 1759   MCV 93 06/23/2018  1559   MCH 27.2 08/25/2018 1759   MCHC 28.7 (L) 08/25/2018 1759   RDW 14.2 08/25/2018 1759   RDW 14.0 06/23/2018 1559   LYMPHSABS 2.2 08/25/2018 1759   LYMPHSABS 2.2 06/23/2018 1559   MONOABS 0.7 08/25/2018 1759   EOSABS 0.6 (H) 08/25/2018 1759   EOSABS 0.8 (H) 06/23/2018 1559   BASOSABS 0.1 08/25/2018 1759   BASOSABS 0.0 06/23/2018 1559   CMP     Component Value Date/Time   NA 139 08/25/2018 1808   NA 142 06/23/2018 1559   K 3.9 08/25/2018 1808   CL 97 (L) 08/25/2018 1808   CO2 33 (H) 08/25/2018 1759   GLUCOSE 167 (H) 08/25/2018 1808   BUN 25 (H) 08/25/2018 1808   BUN 16 06/23/2018 1559   CREATININE 1.10 (H) 08/25/2018 1808   CREATININE 0.87 09/29/2016 1335   CALCIUM 9.7 08/25/2018 1759   PROT 7.1 08/25/2018 1759   PROT 7.2 06/23/2018 1559   ALBUMIN 3.7 08/25/2018 1759   ALBUMIN 4.3 06/23/2018 1559   AST 21 08/25/2018 1759   ALT 15 08/25/2018 1759   ALKPHOS 64 08/25/2018 1759   BILITOT 0.5 08/25/2018 1759   BILITOT 0.3 06/23/2018 1559   GFRNONAA 54 (L) 08/25/2018 1759   GFRAA >60 08/25/2018 1759  PT 12.7/INR 0.96.  APTT 29.    Imaging I have reviewed the images obtained:  CT-scan of the brain -small area of hemorrhage in the right splenium with calculus.  Assessment:  72 year old woman with a past medical history of hypertension, hyper lipidemia, COPD, tobacco abuse quit smoking 2006, coronary artery disease with a history of CABG, coronary fibrosis, hypothyroidism, obesity, rheumatoid/psoriatic/osteoarthritis presenting with confusion of 1 to 2 days and some headaches ongoing for the past couple weeks noted to have a small area of bleed in the splenium of the right corpus callosum. Differentials are broad but include hypertension versus underlying vascular malformation.  Low on the differentials are amyloid angiopathy as well as venous infarcts and mass.  Plan: Subcortical ICH, nontraumatic Acuity: Acute Laterality: Right corpus callosum Current suspected  etiology: Investigation Treatment: -Admit to-neurological ICU -ICH Score: 0 -ICH Volume: 2 cc -BP control goal SYS<140 -Cleviprex/labetalol as needed. -PT/OT/ST  -neuromonitoring -Repeat head CT in 24 hours -MRI of the brain with and without contrast along with MRA of the brain to look for any vascular abnormality -Continue home Celexa  -No antiplatelets or anticoagulants.  CNS Cerebral edema-minimal Compression of brain-minimal -No indication yet for hyperosmolar therapy  -Close neuro monitoring -Continue PT/OT/ST  RESP COPD, pulmonary fibrosis -Maintain supplemental oxygen - Breathing treatments as needed.  Continue home meds. - We will request PCCM assistance if needed  CV Essential (primary) hypertension Hypertensive Emergency -Aggressive BP control, goal SBP < 140  Heart failure, unspecified  -TTE  GI/GU GFR 54, creatinine 1.1 -Gentle hydration -avoid nephrotoxic agents  HEME -Monitor -transfuse for hgb < 7  ENDO -goal HgbA1c < 7 -Continue levothyroxine  Fluid/Electrolyte Disorders -Replete as needed based on labs  ID No active issues Check urinalysis and chest x-ray  Nutrition E66.9 Obesity  -diet consult  Prophylaxis DVT: SCDs only-no antiplatelets or anticoagulants  GI: Pantoprazole Bowel: Docusate senna  Dispo: TBD-possibly home  Diet: NPO until cleared by bedside swallow or formal speech evaluation  Code Status: Full Code   Spoke with the family in detail and answered all the questions the best my ability. Relayed my plan in detail to the ED provider in person. Stroke team to continue to follow the patient.  -- Milon Dikes, MD [facility] Pager: [phone] If 7pm to 7am, please call on call as listed on AMION.   CRITICAL CARE ATTESTATION This patient is critically ill and at significant risk of neurological worsening, death and care requires constant monitoring of vital signs, hemodynamics, respiratory, and  cardiac monitoring. I spent 45  minutes of neurocritical care time performing neurological assessment, discussion with family, other specialists and medical decision making of high complexity in the care of  this patient.

## 2018-08-25 NOTE — ED Provider Notes (Signed)
[name] EMERGENCY DEPARTMENT Provider Note   CSN: [id number] Arrival date & time: 08/25/18  1710     History   Chief Complaint Chief Complaint  Patient presents with  . Altered Mental Status    HPI [name] is a 72 y.o. female.  HPI  72 year old female presents for confusion.  Twice, starting yesterday and then again today she has had trouble driving to the doctor's office.  She states she got in the car and knew she was going to the doctor's office but could not remember how to get there.  She eventually was able to be instructed on how to get there.  Again occurred today while her daughter was riding with her she could not figure out how to get to the doctor's office and missed a turn.  She has been having some headaches at night over the last couple nights.  She takes a baby aspirin each day but no other blood thinners.  However no weakness or numbness in the extremities or blurry vision.  No trouble speaking or slurred speech.  Past Medical History:  Diagnosis Date  . Acute bronchitis 09/25/2015  . ACUTE ON CHRONIC DIASTOLIC HEART FAILURE 12/04/2010   Qualifier: Diagnosis of  By: Ladona Ridgel, MD, [facility], [place]ia Alcon   . Arthritis    OA RIGHT KNEE WITH PAIN  . Barrett esophagus   . Bradycardia 06/01/2015  . Chronic respiratory failure (HCC)   . Chronic respiratory failure with hypoxia and hypercapnia (HCC) 02/04/2010   Followed in Pulmonary clinic/ [facility]/ [place]       - 02 dependent  since 07/02/10 >>  83% RA December 05, 2010       - ONO RA 08/05/12  :  Positive sat < 89 x 2:66m> repeat on 2lpm rec 08/12/2012  - 06/17/2013 reported desat with activity p Knee surgery > rec restart 2lpm with activity  - 06/27/2013   Walked 2lpm  x one lap @ 185 stopped due to sat 88% not sob , desat to 82% on RA just at th  . COPD (chronic obstructive pulmonary disease) (HCC)   . COPD III spirometry if use FEV1/VC p saba  07/18/2010   Quit smoking May 2006       -  PFT's  04/12/10 FEV1  1.21 (69%) ratio 77 and no change p B2,  DLC0 56%   VC 70%         - PFTs  08/08/2013 FEV1 1.21 (60%) ratio 86 and no change p B2 DLCO 79%  VC 72%  On symbicort 160 2bid  - PFT's  02/08/2018  FEV1 0.70 (40 % ) ratio 56 if use FEV1/VC  p 38 % improvement from saba p symb 160 prior to study with DLCO  78 % corrects to 147  % for alv volume   - 02/08/2018  . Cough variant asthma 02/26/2011   Followed in Pulmonary clinic/ [facility]/ [place]  - PFT's  06/04/15  FEV1 1.20 (67 % ) ratio 83  p 6 % improvement from saba with DLCO  80 % corrects to 132 % for alv volume      - Clinical dx based on response to symbicort       FENO 09/16/2016  =   96 on symbicort 160 2bid > added singulair  Allergy profile 09/16/2016 >  Eos 0.5 /  IgE  78 neg RAST  -  Referred to rehab 04/29/2017 > completed  . DOE (dyspnea on  exertion) 02/19/2016  . Essential hypertension 04/20/2007   Qualifier: Diagnosis of  By: Marcelyn Ditty RN, Katy Fitch   . GERD (gastroesophageal reflux disease)   . History of ARDS 2006  . History of home oxygen therapy    AT NIGHT WHEN SLEEPING 2 L / MIN NASAL CANNULA  . Hyperlipidemia 07/12/2015  . Hypertension   . Hypothyroidism   . Morbid (severe) obesity due to excess calories (HCC) 04/22/2015   pfts with erv 14% 06/04/15  And 33% 02/08/2018   . NSTEMI (non-ST elevated myocardial infarction) (HCC) 05/31/2015  . Pneumococcal pneumonia (HCC) 2006   HOSPITALIZED AND DEVELOPED ARDS  . Psoriatic arthritis (HCC)   . Pulmonary fibrosis (HCC)   . PULMONARY FIBROSIS ILD POST INFLAMMATORY CHRONIC 07/18/2010   Followed as Primary Care Patient/ [facility]/ [place]  -s/p ARDS 2006 with bacteremic S  Pna       - CT chest 07/03/10 Nonspecific PF mostly upper lobes       - CT chest 12/03/10 acute gg changes and effusions c/w chf - PFT's  02/08/2018  FVC 0.64 (28 %)   with DLCO  78 % corrects to 147 % for alv volume     . Rheumatic disease   . 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 SOB (shortness of breath) on exertion      Patient Active Problem List   Diagnosis Date Noted  . ICH (intracerebral hemorrhage) (HCC) 08/25/2018  . Hyperlipidemia 07/12/2015  . 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 Bradycardia 06/01/2015  . NSTEMI (non-ST elevated myocardial infarction) (HCC) 05/31/2015  . Morbid (severe) obesity due to excess calories (HCC) 04/22/2015  . Postoperative anemia due to acute blood loss 06/08/2013  . History of home oxygen therapy 06/07/2013  . GERD (gastroesophageal reflux disease) 06/07/2013  . Barrett's esophagus 06/07/2013  . OA (osteoarthritis) of knee 06/06/2013  . ACUTE ON CHRONIC DIASTOLIC HEART FAILURE 12/04/2010  . COPD III spirometry if use FEV1/VC p saba  07/18/2010  . 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 Compression fracture of thoracic vertebra (HCC) 07/02/2010  . Chronic respiratory failure with hypoxia and hypercapnia (HCC) 02/04/2010  . Hypothyroidism 04/20/2007  . Essential hypertension 04/20/2007  . PSORIATIC ARTHRITIS 04/20/2007    Past Surgical History:  Procedure Laterality Date  . ABDOMINOPLASTY    . CARDIAC CATHETERIZATION N/A 06/01/2015   Procedure: Left Heart Cath and Coronary Angiography;  Surgeon: Runell Gess, MD;  Location: [facility] INVASIVE CV LAB;  Service: Cardiovascular;  Laterality: N/A;  . CARPAL TUNNEL RELEASE    . CHOLECYSTECTOMY    . CORONARY ARTERY BYPASS GRAFT N/A 06/04/2015   Procedure: CORONARY ARTERY BYPASS GRAFT times three            with left internal mammary artery and right leg saphenous vein;  Surgeon: Alleen Borne, MD;  Location: [facility] OR;  Service: Open Heart Surgery;  Laterality: N/A;  . cosmetic breast surgery    . JOINT REPLACEMENT    . KNEE ARTHROSCOPY Left   . TEE WITHOUT CARDIOVERSION  06/04/2015   Procedure: TRANSESOPHAGEAL ECHOCARDIOGRAM (TEE);  Surgeon: Alleen Borne, MD;  Location: [facility] OR;  Service: Open Heart Surgery;;  . TOTAL KNEE ARTHROPLASTY Left   . TOTAL KNEE ARTHROPLASTY Right 06/06/2013   Procedure: RIGHT  TOTAL KNEE ARTHROPLASTY;  Surgeon: Loanne Drilling, MD;  Location: [facility] ORS;  Service: Orthopedics;  Laterality: Right;     OB History   None      Home Medications  Prior to Admission medications   Medication Sig Start Date End Date Taking? Authorizing Provider  acetaminophen (TYLENOL ARTHRITIS PAIN) 650 MG CR tablet Take 1,300 mg by mouth every 8 (eight) hours as needed for pain. Per bottle directions as needed    [provider]  albuterol (PROAIR HFA) 108 (90 Base) MCG/ACT inhaler 2 puffs every 4 hours as needed only  if your can't catch your breath 07/22/16   Nyoka Cowden, MD  aspirin EC 81 MG tablet Take 1 tablet (81 mg total) by mouth daily. 11/09/17   Lars Masson, MD  atorvastatin (LIPITOR) 80 MG tablet TAKE 1 TABLET BY MOUTH  DAILY AT 6 PM. 01/14/18   Lars Masson, MD  Cholecalciferol (VITAMIN D3) 2000 UNITS TABS Take 2,000 Int'l Units by mouth daily.    [provider]  citalopram (CELEXA) 40 MG tablet TAKE 1 TABLET BY MOUTH  EVERY MORNING 04/02/18   Gordy Savers, MD  famotidine (PEPCID) 20 MG tablet One at bedtime 07/22/16   Nyoka Cowden, MD  furosemide (LASIX) 20 MG tablet TAKE 1 TABLET BY MOUTH TWO  TIMES DAILY Patient taking differently: Take 20 mg by mouth daily.  03/30/18   Lars Masson, MD  Golimumab [facility] ARIA IV) Inject into the vein every 8 (eight) weeks.    [provider]  levothyroxine (SYNTHROID, LEVOTHROID) 150 MCG tablet TAKE 1 TABLET BY MOUTH  DAILY BEFORE BREAKFAST 01/15/18   Lars Masson, MD  losartan (COZAAR) 25 MG tablet Take 1 tablet (25 mg total) by mouth daily. 06/23/18   Lars Masson, MD  montelukast (SINGULAIR) 10 MG tablet TAKE 1 TABLET BY MOUTH AT  BEDTIME 09/18/17   Oretha Milch, MD  NON FORMULARY 2lpm 24/7    [provider]  OXYGEN Inhale into the lungs. continuous o2    [provider]  pantoprazole (PROTONIX) 40 MG tablet TAKE 1 TABLET BY MOUTH  DAILY 30 TO 60  MINUTES  BEFORE FIRST MEAL OF THE  DAY 03/30/18   Nyoka Cowden, MD  SYMBICORT 160-4.5 MCG/ACT inhaler USE 2 PUFFS TWO TIMES DAILY 04/01/18   Lars Masson, MD  traMADol (ULTRAM) 50 MG tablet TAKE 1-2 TABLETS EVERY 6 HOURS AS NEEDED FOR PAIN 07/15/17   Gordy Savers, MD  triamcinolone cream (KENALOG) 0.1 % Apply 1 application topically 2 (two) times daily as needed (for psorisis).    [provider]    Family History Family History  Problem Relation Age of Onset  . Breast cancer Mother   . Coronary artery disease Father   . Rheum arthritis Father     Social History Social History   Tobacco Use  . Smoking status: Former Smoker    Packs/day: 1.50    Years: 58.50    Pack years: 87.75    Types: Cigarettes    Last attempt to quit: 03/03/2005    Years since quitting: 13.4  . Smokeless tobacco: Never Used  Substance Use Topics  . Alcohol use: No  . Drug use: No     Allergies   Patient has no known allergies.   Review of Systems Review of Systems  Constitutional: Negative for fever.  Eyes: Negative for visual disturbance.  Respiratory: Negative for shortness of breath.   Cardiovascular: Negative for chest pain.  Gastrointestinal: Negative for vomiting.  Neurological: Positive for headaches. Negative for dizziness, weakness and numbness.  Psychiatric/Behavioral: Positive for confusion.  All other systems reviewed and are negative.  Physical Exam Updated Vital Signs BP (!) 160/64 (BP Location: Left Arm)   Pulse 87   Temp 99.3 F (37.4 C) (Oral)   Resp (!) 22   SpO2 96%   Physical Exam  Constitutional: She is oriented to person, place, and time. She appears well-developed and well-nourished. No distress.  HENT:  Head: Normocephalic and atraumatic.  Right Ear: External ear normal.  Left Ear: External ear normal.  Nose: Nose normal.  Eyes: Pupils are equal, round, and reactive to light. EOM are normal. Right eye exhibits no discharge. Left eye  exhibits no discharge.  Neck: Neck supple.  Cardiovascular: Normal rate, regular rhythm and normal heart sounds.  Pulmonary/Chest: Effort normal and breath sounds normal.  Abdominal: Soft. There is no tenderness.  Neurological: She is alert and oriented to person, place, and time.  CN 3-12 grossly intact. 5/5 strength in all 4 extremities. Grossly normal sensation. Normal finger to nose.   Skin: Skin is warm and dry. She is not diaphoretic.  Nursing note and vitals reviewed.    ED Treatments / Results  Labs (all labs ordered are listed, but only abnormal results are displayed) Labs Reviewed  CBC - Abnormal; Notable for the following components:      Result Value   Hemoglobin 11.4 (*)    MCHC 28.7 (*)    All other components within normal limits  DIFFERENTIAL - Abnormal; Notable for the following components:   Eosinophils Absolute 0.6 (*)    All other components within normal limits  COMPREHENSIVE METABOLIC PANEL - Abnormal; Notable for the following components:   CO2 33 (*)    Glucose, Bld 169 (*)    Creatinine, Ser 1.02 (*)    GFR calc non Af Amer 54 (*)    All other components within normal limits  I-STAT CHEM 8, ED - Abnormal; Notable for the following components:   Chloride 97 (*)    BUN 25 (*)    Creatinine, Ser 1.10 (*)    Glucose, Bld 167 (*)    TCO2 35 (*)    All other components within normal limits  PROTIME-INR  APTT  RAPID URINE DRUG SCREEN, HOSP PERFORMED  HEMOGLOBIN A1C  LIPID PANEL  PROTIME-INR  I-STAT TROPONIN, ED  CBG MONITORING, ED    EKG EKG Interpretation  Date/Time:  Wednesday August 25 2018 17:50:59 EDT Ventricular Rate:  96 PR Interval:  166 QRS Duration: 100 QT Interval:  374 QTC Calculation: 472 R Axis:   88 Text Interpretation:  Sinus rhythm with Premature atrial complexes Incomplete right bundle branch block Cannot rule out Anterior infarct , age undetermined Abnormal ECG afib no longer present comapred to Aug 2016 Confirmed by  Pricilla Loveless [phone]) on 08/25/2018 7:04:08 PM   Radiology Ct Head Wo Contrast  Result Date: 08/25/2018 CLINICAL DATA:  72 y/o  F; confusion. EXAM: CT HEAD WITHOUT CONTRAST TECHNIQUE: Contiguous axial images were obtained from the base of the skull through the vertex without intravenous contrast. COMPARISON:  04/22/2005 CT of the head. FINDINGS: Brain: Acute hemorrhage measuring 0.9 x 1.9 x 2.4 cm (volume = 2 cm^3) centered posterior to the right forceps of splenium of corpus callosum (series 3, image 18). Mild surrounding edema and local mass effect. No additional intracranial hemorrhage, stroke, or focal mass effect identified. Nonspecific white matter hypodensities are compatible with mild chronic microvascular ischemic changes for age and there is mild volume loss. Vascular: Calcific atherosclerosis of the carotid siphons. No hyperdense vessel identified. Skull: Normal. Negative for fracture  or focal lesion. Sinuses/Orbits: No acute finding. Other: None. IMPRESSION: 1. Acute hemorrhage posterior to the right splenium of corpus callosum measuring up to 2.4 cm, 2 cc. Mild associated edema and mass effect. 2. No additional acute intracranial abnormality identified. 3. Mild chronic microvascular ischemic changes and volume loss of the brain for age. Critical Value/emergent results were called by telephone at the time of interpretation on 08/25/2018 at 6:55 pm to NP HOPE NEESE , who verbally acknowledged these results. Electronically Signed   By: Mitzi Hansen M.D.   On: 08/25/2018 19:00    Procedures .Critical Care Performed by: Pricilla Loveless, MD Authorized by: Pricilla Loveless, MD   Critical care provider statement:    Critical care time (minutes):  35   Critical care time was exclusive of:  Separately billable procedures and treating other patients   Critical care was necessary to treat or prevent imminent or life-threatening deterioration of the following conditions:  CNS failure  or compromise   Critical care was time spent personally by me on the following activities:  Development of treatment plan with patient or surrogate, discussions with consultants, evaluation of patient's response to treatment, examination of patient, obtaining history from patient or surrogate, review of old charts, re-evaluation of patient's condition, pulse oximetry, ordering and review of radiographic studies, ordering and review of laboratory studies and ordering and performing treatments and interventions   (including critical care time)  Medications Ordered in ED Medications  clevidipine (CLEVIPREX) infusion 0.5 mg/mL (1 mg/hr Intravenous New Bag/Given 08/25/18 2008)   stroke: mapping our early stages of recovery book (has no administration in time range)  acetaminophen (TYLENOL) tablet 650 mg (has no administration in time range)    Or  acetaminophen (TYLENOL) solution 650 mg (has no administration in time range)    Or  acetaminophen (TYLENOL) suppository 650 mg (has no administration in time range)  senna-docusate (Senokot-S) tablet 1 tablet (has no administration in time range)  pantoprazole (PROTONIX) EC tablet 40 mg (has no administration in time range)     Initial Impression / Assessment and Plan / ED Course  I have reviewed the triage vital signs and the nursing notes.  Pertinent labs & imaging results that were available during my care of the patient were reviewed by me and considered in my medical decision making (see chart for details).     CT shows intracranial hemorrhage.  It is an odd location.  Probably hypertensive but she will need an MRI to help rule out other causes.  Originally was going to give labetalol but after discussion with Dr. Wilford Corner, will do Cleviprex instead.  He will admit.  I did discuss with Dr. Johnsie Cancel, who recommends MRI and blood pressure control but no acute neurosurgical intervention.  Final Clinical Impressions(s) / ED Diagnoses   Final  diagnoses:  Intracranial hemorrhage [facility])    ED Discharge Orders    None       Pricilla Loveless, MD 08/25/18 2016

## 2018-08-25 NOTE — ED Triage Notes (Signed)
Pt reports episode of confusion yesterday while attempting to drive to MD's office, states she couldn't remember how to get there, confusion resolved. Today she reports that she went to the bank and couldn't remember how to get out of the bank after transaction. At this time pt A&O x4.

## 2018-08-25 NOTE — ED Provider Notes (Signed)
  Patient placed in Quick Look pathway, seen and evaluated   Chief Complaint: Confusion  HPI: [name] is a 72 y.o. female who presents to the ED for confusion. Pt reports episode of confusion yesterday while attempting to drive to MD office, states she couldn't remember how to get there, confusion resolved. Today she reports that she went to the bank and couldn't remember how to get out of the bank after transaction  ROS: Neuro: confusion  Physical Exam:  BP (!) 162/65 (BP Location: Right Arm)   Pulse 94   Temp 99.3 F (37.4 C) (Oral)   Resp 18   SpO2 98%    Gen: No distress  Neuro: Awake and Alert, grips are equal, no pronator drift  Skin: Warm and dry   Initiation of care has begun. The patient has been counseled on the process, plan, and necessity for staying for the completion/evaluation, and the remainder of the medical screening examination    Janne Napoleon, NP 08/25/18 1754    Mesner, Barbara Cower, MD 08/25/18 2233

## 2018-08-26 ENCOUNTER — Other Ambulatory Visit: Payer: Self-pay

## 2018-08-26 ENCOUNTER — Inpatient Hospital Stay (HOSPITAL_COMMUNITY): Payer: Medicare Other

## 2018-08-26 DIAGNOSIS — I610 Nontraumatic intracerebral hemorrhage in hemisphere, subcortical: Secondary | ICD-10-CM

## 2018-08-26 DIAGNOSIS — I5030 Unspecified diastolic (congestive) heart failure: Secondary | ICD-10-CM

## 2018-08-26 DIAGNOSIS — E785 Hyperlipidemia, unspecified: Secondary | ICD-10-CM

## 2018-08-26 DIAGNOSIS — I161 Hypertensive emergency: Secondary | ICD-10-CM

## 2018-08-26 LAB — ECHOCARDIOGRAM COMPLETE
Height: 59 in
Weight: 3280.44 oz

## 2018-08-26 LAB — MRSA PCR SCREENING: MRSA by PCR: NEGATIVE

## 2018-08-26 MED ORDER — GADOBUTROL 1 MMOL/ML IV SOLN
9.0000 mL | Freq: Once | INTRAVENOUS | Status: AC | PRN
  Administered 2018-08-26: 9 mL via INTRAVENOUS

## 2018-08-26 MED ORDER — IOPAMIDOL (ISOVUE-370) INJECTION 76%
INTRAVENOUS | Status: AC
  Administered 2018-08-26: 14:00:00
  Filled 2018-08-26: qty 50

## 2018-08-26 MED ORDER — IOPAMIDOL (ISOVUE-370) INJECTION 76%
50.0000 mL | Freq: Once | INTRAVENOUS | Status: AC | PRN
  Administered 2018-08-26: 50 mL via INTRAVENOUS

## 2018-08-26 MED ORDER — LOSARTAN POTASSIUM 50 MG PO TABS
50.0000 mg | ORAL_TABLET | Freq: Every day | ORAL | Status: DC
  Administered 2018-08-26 – 2018-08-27 (×2): 50 mg via ORAL
  Filled 2018-08-26 (×2): qty 1

## 2018-08-26 MED ORDER — ACETAMINOPHEN 325 MG PO TABS
650.0000 mg | ORAL_TABLET | Freq: Four times a day (QID) | ORAL | Status: DC | PRN
  Administered 2018-08-26 – 2018-08-27 (×2): 650 mg via ORAL
  Filled 2018-08-26 (×2): qty 2

## 2018-08-26 MED ORDER — LOSARTAN POTASSIUM 50 MG PO TABS: 25.0000 mg | ORAL_TABLET | Freq: Every day | ORAL | Status: DC

## 2018-08-26 MED ORDER — LABETALOL HCL 5 MG/ML IV SOLN
10.0000 mg | INTRAVENOUS | Status: DC | PRN
  Administered 2018-08-27 (×2): 10 mg via INTRAVENOUS
  Filled 2018-08-26 (×2): qty 4

## 2018-08-26 NOTE — Progress Notes (Signed)
Echocardiogram 2D Echocardiogram has been performed.  [name] 08/26/2018, 3:40 PM

## 2018-08-26 NOTE — Progress Notes (Signed)
Occupational Therapy Evaluation Patient Details Name: [name] MRN: [id number] DOB: 03-29-1946 Today's Date: 08/26/2018    History of Present Illness 72 y.o. 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 CT of the head was performed that showed a small area of hemorrhage in the right splenium of the corpus callosum of about 2 cc with mild associated edema and mass-effect locally.    Clinical Impression   PTA, pt lived alone and was independent with ADL and IADL tasks, including driving. Pt describes deficits consistent with apparent topographical disorientation. Pt states she did not recognize landmarks which were "once familiar to her" when trying to get home. "I couldn't find my way out of the bank". Will further assess as eval limited by headache. Pt states she has not gotten lost in her own home. At this time, recommend S after DC and follow up with OT at the neuro outpt center. Best situation would be for daughter to live with pt during this recovery. Recommend pt refrain from driving at this time. Will follow acutely.     Follow Up Recommendations  Other (comment);Outpatient OT;Supervision - Intermittent  Refrain from driving   Equipment Recommendations  None recommended by OT    Recommendations for Other Services Speech consult     Precautions / Restrictions Precautions Precautions: Other (comment) Precaution Comments: (P) pt uses O2 at home 2 L, and monitor BPs (goal per Neuro note is <160 systolic)      Mobility Bed Mobility Overal bed mobility: Modified Independent                Transfers Overall transfer level:  Modified independent                    Balance Overall balance assessment: No apparent balance deficits (not formally assessed)                                         ADL either performed or assessed with clinical judgement   ADL Overall ADL's : Needs assistance/impaired                                     Functional mobility during ADLs: Supervision/safety General ADL Comments: Able to complete basic ADL tasks; need to further assess IADL tasks     Vision Baseline Vision/History: Wears glasses Wears Glasses: Reading only Additional Comments: will further assess; does not report any visual changes     Perception Perception Comments: Pt appears to demosntrate topographic disorientation. Will further assess   Praxis Praxis Praxis tested?: Within functional limits    Pertinent Vitals/Pain Pain Assessment: (P) No/denies pain Pain Score: 5  Pain Location: headache Pain Descriptors / Indicators: Aching Pain Intervention(s): Limited activity within patient's tolerance;Patient requesting pain meds-RN notified     Hand Dominance (P) Right   Extremity/Trunk Assessment Upper Extremity Assessment Upper Extremity Assessment: Overall WFL for tasks assessed   Lower Extremity Assessment Lower Extremity Assessment: Overall WFL for tasks assessed   Cervical / Trunk Assessment Cervical / Trunk Assessment: Normal  Communication Communication Communication: (P) No difficulties   Cognition Arousal/Alertness: (P) Awake/alert Behavior During Therapy: (P) WFL for tasks assessed/performed Overall Cognitive Status: (P) Impaired/Different from baseline Area of Impairment: (P) Memory                               General Comments: will further assess   General Comments       Exercises     Shoulder Instructions      Home Living Family/patient expects to be discharged to:: (P) Private residence Living Arrangements: (P)  Alone Available Help at Discharge: (P) Family;Available PRN/intermittently Type of Home: (P) House Home Access: (P) Stairs to enter Entrance Stairs-Number of Steps: (P) 5 Entrance Stairs-Rails: (P) Right;Left Home Layout: (P) One level     Bathroom Shower/Tub: (P) Tub/shower unit;Walk-in shower   Bathroom Toilet: (P) Handicapped height Bathroom Accessibility: (P) Yes How Accessible: Accessible via walker Home Equipment: (P) Shower seat;Grab bars - tub/shower          Prior Functioning/Environment Level of Independence: (P) Independent        Comments: (P) drove up until this event; independent with finances adn medication managemetn        OT Problem List: Impaired vision/perception;Pain      OT Treatment/Interventions: Self-care/ADL training;Therapeutic activities;Cognitive remediation/compensation;Visual/perceptual remediation/compensation;Patient/family education    OT Goals(Current goals can be found in the care plan section) Acute Rehab OT Goals Patient Stated Goal: to find out what happended OT Goal Formulation: With patient/family Time For Goal Achievement: 09/09/18 Potential to Achieve Goals: Good  OT Frequency: Min 2X/week   Barriers to D/C:            Co-evaluation              AM-PAC PT "6 Clicks" Daily Activity     Outcome Measure Help from another person eating meals?: None Help from another person taking care of personal grooming?: None Help from another person toileting, which includes using toliet, bedpan, or urinal?: None Help from another person bathing (including washing, rinsing, drying)?: None Help from another person to put on and taking off regular upper body clothing?: None Help from another person to put on and taking off regular lower body clothing?: None 6 Click Score: 24   End of Session Equipment Utilized During Treatment: Oxygen(3L) Nurse Communication: Patient requests pain meds  Activity Tolerance: Patient limited by  pain(headache) Patient left: in bed;with call bell/phone within reach;with family/visitor present  OT Visit Diagnosis: Other symptoms and signs involving cognitive function;Pain Pain - part of body: (head)                Time: 1435-1455 OT Time Calculation (min): 20 min Charges:  OT General Charges $OT Visit: 1 Visit OT Evaluation $OT Eval Moderate Complexity: 1 Mod  Qais Jowers, OT/L   Acute OT Clinical Specialist Acute Rehabilitation Services Pager [phone] Office [phone]   [facility] 08/26/2018, 4:49 PM

## 2018-08-26 NOTE — Evaluation (Signed)
Physical Therapy Evaluation Patient Details Name: [name] MRN: [id number] DOB: 1946/07/05 Today's Date: 08/26/2018   History of Present Illness  72 y.o. 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 CT of the head was performed that showed a small area of hemorrhage in the right splenium of the corpus callosum of about 2 cc with mild associated edema and mass-effect locally.   Clinical Impression  Pt is mobilizing well, close to baseline, likely.  She has some mild DOE, but wears O2 [place] at home at baseline and has a sedentary lifestyle.  She is well aware of her cognitive deficits and so is her family (daughter went with her after her initial episode driving to make sure she could get to where she was going and she could not).  PT will follow acutely, but pt with not need PT follow up at discharge.    Follow Up Recommendations No PT follow up;Supervision/Assistance - 24 hour(initially due to cognition)    Equipment Recommendations  None recommended by PT    Recommendations for Other Services   NA     Precautions / Restrictions Precautions Precautions: Other (comment) Precaution Comments: pt uses O2 at home 2 L, and monitor BPs (goal per Neuro note is <160 systolic)      Mobility  Bed Mobility Overal bed mobility: Modified Independent                Transfers Overall transfer level: Modified independent Equipment used: None                Ambulation/Gait Ambulation/Gait assistance: Min guard Gait Distance (Feet): 65 Feet Assistive device: None Gait Pattern/deviations: Step-through pattern     General Gait Details: Pt  with wide base gait pattern due to body habitus      Modified Rankin (Stroke Patients Only) Modified Rankin (Stroke Patients Only) Pre-Morbid Rankin Score: No symptoms Modified Rankin: Slight disability     Balance Overall balance assessment: Needs assistance Sitting-balance support: Feet supported;No upper extremity supported Sitting balance-[name]: Good     Standing balance support: No upper extremity supported Standing balance-[name]: Good                               Pertinent Vitals/Pain Pain Assessment: No/denies pain Pain Score: 5  Pain Location: headache Pain Descriptors / Indicators: Aching Pain Intervention(s): Limited activity within patient's tolerance;Patient requesting pain meds-RN notified    Home Living Family/patient expects to be discharged to:: Private residence Living Arrangements: Alone Available Help at Discharge: Family;Available PRN/intermittently Type of Home: House Home Access: Stairs to enter Entrance Stairs-Rails: Doctor, general practice of Steps: 5 Home Layout: One level Home Equipment: Shower seat;Grab bars - tub/shower      Prior Function Level of Independence: Independent         Comments: drove up until this event; independent with finances adn medication managemetn     Hand Dominance   Dominant Hand: Right    Extremity/Trunk Assessment   Upper Extremity Assessment Upper Extremity Assessment: Defer to OT evaluation    Lower Extremity Assessment Lower Extremity Assessment: Overall WFL for tasks assessed(strength 5/5 per seated MMT, coordination and sensation  WNL)    Cervical / Trunk Assessment Cervical / Trunk Assessment: Normal  Communication   Communication: No difficulties  Cognition Arousal/Alertness: Awake/alert Behavior During Therapy: WFL for tasks assessed/performed Overall Cognitive Status: Impaired/Different from baseline Area of Impairment: Memory                      Memory: Decreased short-term memory         General Comments: will further assess      General Comments General comments (skin integrity, edema, etc.): 2/4 DOE with gait. 2L O2 [place] and VSS.  BPs within parameters        Assessment/Plan    PT Assessment Patient needs continued PT services  PT Problem List Decreased activity tolerance;Decreased balance;Decreased mobility;Decreased knowledge of precautions;Decreased knowledge of use of DME;Obesity       PT Treatment Interventions DME instruction;Gait training;Stair training;Functional mobility training;Therapeutic activities;Therapeutic exercise;Balance training;Patient/family education    PT Goals (Current goals can be found in the Care Plan section)  Acute Rehab PT Goals Patient Stated Goal: to return home, return to normal PT Goal Formulation: With patient Time For Goal Achievement: 09/09/18 Potential to Achieve Goals: Good    Frequency Min 4X/week           AM-PAC PT "6 Clicks" Daily Activity  Outcome Measure Difficulty turning over in bed (including adjusting bedclothes, sheets and blankets)?: None Difficulty moving from lying on back to sitting on the side of the bed? : None Difficulty sitting down on and standing up from a chair with arms (e.g., wheelchair, bedside commode, etc,.)?: None Help needed moving to and from a bed to chair (including a wheelchair)?: A Little Help needed walking in hospital room?: A Little Help needed climbing 3-5 steps with a railing? : A Little 6 Click Score: 21    End of Session Equipment Utilized During Treatment: Oxygen Activity Tolerance: Patient tolerated treatment well Patient left: in bed;with call bell/phone within reach;with family/visitor present   PT Visit Diagnosis: Difficulty in walking, not elsewhere classified (R26.2);Other symptoms and signs involving the nervous system (U88.280)    Time: 0349-1791 PT Time Calculation (min) (ACUTE ONLY): 27 min   Charges:          Lurena Joiner B. Jaquon Gingerich, PT, DPT  Acute Rehabilitation [phone] pager #[phone] office   PT Evaluation $PT Eval Moderate Complexity: 1 Mod PT Treatments $Gait Training: 8-22 mins       08/26/2018, 4:55 PM

## 2018-08-26 NOTE — Progress Notes (Signed)
PT Cancellation Note  Patient Details Name: [name] MRN: [id number] DOB: 01-28-1946   Cancelled Treatment:    Reason Eval/Treat Not Completed: Active bedrest order.  Please page this PT if pt is removed from bedrest and we can proceed with evaluation today, otherwise, we ill check in tomorrow.  Thanks,  Rollene Rotunda. Melroy Bougher, PT, DPT  Acute Rehabilitation [phone] pager [phone]) [phone] office   Lurena Joiner B Lurdes [place]anger 08/26/2018, 9:03 AM

## 2018-08-26 NOTE — Progress Notes (Signed)
STROKE TEAM PROGRESS NOTE   SUBJECTIVE (INTERVAL HISTORY) Her daughter is at the bedside.  Overall she feels her condition is stable. Pt AAO x 3, moving all extremities. Complains of mild frontal HA. CTA and MRI pending.    OBJECTIVE Temp:  [98 F (36.7 C)-99.4 F (37.4 C)] 98.2 F (36.8 C) (10/24 0800) Pulse Rate:  [76-103] 91 (10/24 1030) Cardiac Rhythm: Normal sinus rhythm (10/24 1000) Resp:  [15-28] 20 (10/24 1030) BP: (68-162)/(44-132) 139/67 (10/24 1030) SpO2:  [81 %-100 %] 96 % (10/24 1030) Weight:  [93 kg] 93 kg (10/23 2108)  No results for input(s): GLUCAP in the last 168 hours. Recent Labs  Lab 08/25/18 1759 08/25/18 1808  NA 138 139  K 3.9 3.9  CL 98 97*  CO2 33*  --   GLUCOSE 169* 167*  BUN 23 25*  CREATININE 1.02* 1.10*  CALCIUM 9.7  --    Recent Labs  Lab 08/25/18 1759  AST 21  ALT 15  ALKPHOS 64  BILITOT 0.5  PROT 7.1  ALBUMIN 3.7   Recent Labs  Lab 08/25/18 1759 08/25/18 1808  WBC 9.3  --   NEUTROABS 5.7  --   HGB 11.4* 12.9  HCT 39.7 38.0  MCV 94.7  --   PLT 230  --    No results for input(s): CKTOTAL, CKMB, CKMBINDEX, TROPONINI in the last 168 hours. Recent Labs    08/25/18 1759 08/25/18 2019  LABPROT 12.7 12.7  INR 0.96 0.96   No results for input(s): COLORURINE, LABSPEC, PHURINE, GLUCOSEU, HGBUR, BILIRUBINUR, KETONESUR, PROTEINUR, UROBILINOGEN, NITRITE, LEUKOCYTESUR in the last 72 hours.  Invalid input(s): APPERANCEUR     Component Value Date/Time   CHOL 133 08/25/2018 2019   CHOL 148 11/09/2017 1528   CHOL 117 07/12/2015 1508   TRIG 108 08/25/2018 2019   TRIG 75 07/12/2015 1508   HDL 59 08/25/2018 2019   HDL 67 11/09/2017 1528   HDL 51 07/12/2015 1508   CHOLHDL 2.3 08/25/2018 2019   VLDL 22 08/25/2018 2019   LDLCALC 52 08/25/2018 2019   LDLCALC 58 11/09/2017 1528   LDLCALC 51 07/12/2015 1508   Lab Results  Component Value Date   HGBA1C 5.3 08/25/2018   No results found for: LABOPIA, COCAINSCRNUR, LABBENZ,  AMPHETMU, THCU, LABBARB  No results for input(s): ETH in the last 168 hours.  I have personally reviewed the radiological images below and agree with the radiology interpretations.  Ct Head Wo Contrast  Result Date: 08/25/2018 CLINICAL DATA:  72 y/o  F; confusion. EXAM: CT HEAD WITHOUT CONTRAST TECHNIQUE: Contiguous axial images were obtained from the base of the skull through the vertex without intravenous contrast. COMPARISON:  04/22/2005 CT of the head. FINDINGS: Brain: Acute hemorrhage measuring 0.9 x 1.9 x 2.4 cm (volume = 2 cm^3) centered posterior to the right forceps of splenium of corpus callosum (series 3, image 18). Mild surrounding edema and local mass effect. No additional intracranial hemorrhage, stroke, or focal mass effect identified. Nonspecific white matter hypodensities are compatible with mild chronic microvascular ischemic changes for age and there is mild volume loss. Vascular: Calcific atherosclerosis of the carotid siphons. No hyperdense vessel identified. Skull: Normal. Negative for fracture or focal lesion. Sinuses/Orbits: No acute finding. Other: None. IMPRESSION: 1. Acute hemorrhage posterior to the right splenium of corpus callosum measuring up to 2.4 cm, 2 cc. Mild associated edema and mass effect. 2. No additional acute intracranial abnormality identified. 3. Mild chronic microvascular ischemic changes and volume loss of the  brain for age. Critical Value/emergent results were called by telephone at the time of interpretation on 08/25/2018 at 6:55 pm to NP HOPE NEESE , who verbally acknowledged these results. Electronically Signed   By: Mitzi Hansen M.D.   On: 08/25/2018 19:00   MRI with and without contrast pending  CT head pending  TTE pending   PHYSICAL EXAM  Temp:  [98 F (36.7 C)-99.4 F (37.4 C)] 98.2 F (36.8 C) (10/24 0800) Pulse Rate:  [76-103] 91 (10/24 1030) Resp:  [15-28] 20 (10/24 1030) BP: (68-162)/(44-132) 139/67 (10/24 1030) SpO2:   [81 %-100 %] 96 % (10/24 1030) Weight:  [93 kg] 93 kg (10/23 2108)  General - Well nourished, well developed, in no apparent distress.  Ophthalmologic - fundi not visualized due to noncooperation.  Cardiovascular - Regular rate and rhythm.  Mental Status -  Level of arousal and orientation to time, place, and person were intact. Language including expression, naming, repetition, comprehension was assessed and found intact. Fund of Knowledge was assessed and was intact.  Cranial Nerves II - XII - II - Visual field intact OU. III, IV, VI - Extraocular movements intact. V - Facial sensation intact bilaterally. VII - Facial movement intact bilaterally. VIII - Hearing & vestibular intact bilaterally. X - Palate elevates symmetrically. XI - Chin turning & shoulder shrug intact bilaterally. XII - Tongue protrusion intact.  Motor Strength - The patient's strength was normal in all extremities and pronator drift was absent.  Bulk was normal and fasciculations were absent.   Motor Tone - Muscle tone was assessed at the neck and appendages and was normal.  Reflexes - The patient's reflexes were symmetrical in all extremities and she had no pathological reflexes.  Sensory - Light touch, temperature/pinprick were assessed and were symmetrical.    Coordination - The patient had normal movements in the hands and feet with no ataxia or dysmetria.  Tremor was absent.  Gait and Station - deferred.   ASSESSMENT/PLAN [name] is a 72 y.o. female with history of CHF, bradycardia, COPD, HTN, HLD, obesity, NSTEMI, CAD s/p CABG, RA, PF admitted for confusion. No tPA given due to ICH.    ICH:  right CC splenium small ICH - etiology unclear, MRI w/wo pending  Resultant transient confusion  MRI w/wo Pending  CTA head and neck pending  2D Echo  pending  LDL 52  HgbA1c 5.3  UDS pending  SCDs for VTE prophylaxis  aspirin 81 mg daily prior to admission, now on No  antithrombotic.   Patient counseled to be compliant with her antithrombotic medications  Ongoing aggressive stroke risk factor management  Therapy recommendations:  pending  Disposition:  pending  Hypertensive emergency . Stable on cleviprex  BP goal < 160  Wean off cleviprex as able  Resume home lasix and losartan  Hyperlipidemia  Home meds:  lipitor 80   LDL 52, goal < 70  Recommend to resume lipitor 40 at discharge  Other Stroke Risk Factors  Advanced age  Cigarette smoker quit in 2006  Morbid Obesity, Body mass index is 41.41 kg/m.   Coronary artery disease / MI s/p CABG  CHF - echo pending  Other Active Problems  COPD  Pulmonary fibrosis  Rheumatoid arthritis ?  bradycardia  Hospital day # 1  This patient is critically ill due to ICH, hypertensive emergency and at significant risk of neurological worsening, death form recurrent ICH, cerebral edema, brain herniation, seizure, hypertensive encephalopathy. This patient's care requires constant monitoring  of vital signs, hemodynamics, respiratory and cardiac monitoring, review of multiple databases, neurological assessment, discussion with family, other specialists and medical decision making of high complexity. I spent 40 minutes of neurocritical care time in the care of this patient.   Marvel Plan, MD PhD Stroke Neurology 08/26/2018 11:21 AM    To contact Stroke Continuity provider, please refer to [email or web address]. After hours, contact General Neurology

## 2018-08-26 NOTE — Progress Notes (Signed)
SLP Cancellation Note  Patient Details Name: Gwynevere Lizana MRN: [id number] DOB: 1946-05-20   Cancelled treatment:       Reason Eval/Treat Not Completed: Other (comment)(daughter asked to wait until next day due to headache)   Blenda Mounts Laurice 08/26/2018, 3:01 PM

## 2018-08-27 DIAGNOSIS — Z951 Presence of aortocoronary bypass graft: Secondary | ICD-10-CM

## 2018-08-27 DIAGNOSIS — E782 Mixed hyperlipidemia: Secondary | ICD-10-CM

## 2018-08-27 DIAGNOSIS — I10 Essential (primary) hypertension: Secondary | ICD-10-CM

## 2018-08-27 LAB — BASIC METABOLIC PANEL
Anion gap: 7 (ref 5–15)
BUN: 18 mg/dL (ref 8–23)
CO2: 37 mmol/L — ABNORMAL HIGH (ref 22–32)
Calcium: 9.9 mg/dL (ref 8.9–10.3)
Chloride: 100 mmol/L (ref 98–111)
Creatinine, Ser: 0.88 mg/dL (ref 0.44–1.00)
GFR calc Af Amer: >60 mL/min (ref >60)
GFR calc non Af Amer: >60 mL/min (ref >60)
Glucose, Bld: 124 mg/dL — ABNORMAL HIGH (ref 70–99)
Potassium: 4.7 mmol/L (ref 3.5–5.1)
Sodium: 144 mmol/L (ref 135–145)

## 2018-08-27 LAB — CBC
HCT: 38.1 % (ref 36.0–46.0)
Hemoglobin: 11 g/dL — ABNORMAL LOW (ref 12.0–15.0)
MCH: 27.1 pg (ref 26.0–34.0)
MCHC: 28.9 g/dL — ABNORMAL LOW (ref 30.0–36.0)
MCV: 93.8 fL (ref 80.0–100.0)
Platelets: 201 10*3/uL (ref 150–400)
RBC: 4.06 MIL/uL (ref 3.87–5.11)
RDW: 14.1 % (ref 11.5–15.5)
WBC: 8.3 10*3/uL (ref 4.0–10.5)
nRBC: 0 % (ref 0.0–0.2)

## 2018-08-27 MED ORDER — LOSARTAN POTASSIUM 50 MG PO TABS: 50.0000 mg | ORAL_TABLET | Freq: Every day | ORAL | 1 refills | Status: DC

## 2018-08-27 MED ORDER — ATORVASTATIN CALCIUM 40 MG PO TABS: 40.0000 mg | ORAL_TABLET | Freq: Every day | ORAL | 1 refills | Status: DC

## 2018-08-27 NOTE — Plan of Care (Signed)
Pt ambulates in room without walker, steady gait

## 2018-08-27 NOTE — Evaluation (Signed)
Speech Language Pathology Evaluation Patient Details Name: [name] MRN: [id number] DOB: May 08, 1946 Today's Date: 08/27/2018 Time: 1250-1305 SLP Time Calculation (min) (ACUTE ONLY): 15 min  Problem List:  Patient Active Problem List   Diagnosis Date Noted  . ICH (intracerebral hemorrhage) (HCC) 08/25/2018  . Hyperlipidemia 07/12/2015  . 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 Bradycardia 06/01/2015  . NSTEMI (non-ST elevated myocardial infarction) (HCC) 05/31/2015  . Morbid (severe) obesity due to excess calories (HCC) 04/22/2015  . Postoperative anemia due to acute blood loss 06/08/2013  . History of home oxygen therapy 06/07/2013  . GERD (gastroesophageal reflux disease) 06/07/2013  . Barrett's esophagus 06/07/2013  . OA (osteoarthritis) of knee 06/06/2013  . ACUTE ON CHRONIC DIASTOLIC HEART FAILURE 12/04/2010  . COPD III spirometry if use FEV1/VC p saba  07/18/2010  . 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 Compression fracture of thoracic vertebra (HCC) 07/02/2010  . Chronic respiratory failure with hypoxia and hypercapnia (HCC) 02/04/2010  . Hypothyroidism 04/20/2007  . Essential hypertension 04/20/2007  . PSORIATIC ARTHRITIS 04/20/2007   Past Medical History:  Past Medical History:  Diagnosis Date  . Acute bronchitis 09/25/2015  . ACUTE ON CHRONIC DIASTOLIC HEART FAILURE 12/04/2010   Qualifier: Diagnosis of  By: Ladona Ridgel, MD, [facility], [place] Alcon   . Arthritis    OA RIGHT KNEE WITH PAIN  . Barrett esophagus   . Bradycardia 06/01/2015  . Chronic respiratory failure (HCC)   . Chronic respiratory failure with hypoxia and hypercapnia (HCC) 02/04/2010   Followed in Pulmonary clinic/ [facility]/ [facility]       - 02 dependent  since 07/02/10 >>  83% RA December 05, 2010       - ONO RA 08/05/12  :  Positive sat < 89 x 2:6m> repeat on 2lpm rec 08/12/2012  - 06/17/2013 reported desat with activity p Knee surgery > rec restart 2lpm with activity  - 06/27/2013    Walked 2lpm  x one lap @ 185 stopped due to sat 88% not sob , desat to 82% on RA just at th  . COPD (chronic obstructive pulmonary disease) (HCC)   . COPD III spirometry if use FEV1/VC p saba  07/18/2010   Quit smoking May 2006       - PFT's  04/12/10 FEV1  1.21 (69%) ratio 77 and no change p B2,  DLC0 56%   VC 70%         - PFTs  08/08/2013 FEV1 1.21 (60%) ratio 86 and no change p B2 DLCO 79%  VC 72%  On symbicort 160 2bid  - PFT's  02/08/2018  FEV1 0.70 (40 % ) ratio 56 if use FEV1/VC  p 38 % improvement from saba p symb 160 prior to study with DLCO  78 % corrects to 147  % for alv volume   - 02/08/2018  . Cough variant asthma 02/26/2011   Followed in Pulmonary clinic/ [facility]/ [facility]  - PFT's  06/04/15  FEV1 1.20 (67 % ) ratio 83  p 6 % improvement from saba with DLCO  80 % corrects to 132 % for alv volume      - Clinical dx based on response to symbicort       FENO 09/16/2016  =   96 on symbicort 160 2bid > added singulair  Allergy profile 09/16/2016 >  Eos 0.5 /  IgE  78 neg RAST  -  Referred to rehab 04/29/2017 > completed  . DOE (dyspnea  on exertion) 02/19/2016  . Essential hypertension 04/20/2007   Qualifier: Diagnosis of  By: Marcelyn Ditty RN, Katy Fitch   . GERD (gastroesophageal reflux disease)   . History of ARDS 2006  . History of home oxygen therapy    AT NIGHT WHEN SLEEPING 2 L / MIN NASAL CANNULA  . Hyperlipidemia 07/12/2015  . Hypertension   . Hypothyroidism   . Morbid (severe) obesity due to excess calories (HCC) 04/22/2015   pfts with erv 14% 06/04/15  And 33% 02/08/2018   . NSTEMI (non-ST elevated myocardial infarction) (HCC) 05/31/2015  . Pneumococcal pneumonia (HCC) 2006   HOSPITALIZED AND DEVELOPED ARDS  . Psoriatic arthritis (HCC)   . Pulmonary fibrosis (HCC)   . PULMONARY FIBROSIS ILD POST INFLAMMATORY CHRONIC 07/18/2010   Followed as Primary Care Patient/ [facility]/ [facility]  -s/p ARDS 2006 with bacteremic S  Pna       - CT chest 07/03/10 Nonspecific PF mostly upper lobes       - CT  chest 12/03/10 acute gg changes and effusions c/w chf - PFT's  02/08/2018  FVC 0.64 (28 %)   with DLCO  78 % corrects to 147 % for alv volume     . Rheumatic disease   . 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 SOB (shortness of breath) on exertion    Past Surgical History:  Past Surgical History:  Procedure Laterality Date  . ABDOMINOPLASTY    . CARDIAC CATHETERIZATION N/A 06/01/2015   Procedure: Left Heart Cath and Coronary Angiography;  Surgeon: Runell Gess, MD;  Location: [facility] INVASIVE CV LAB;  Service: Cardiovascular;  Laterality: N/A;  . CARPAL TUNNEL RELEASE    . CHOLECYSTECTOMY    . CORONARY ARTERY BYPASS GRAFT N/A 06/04/2015   Procedure: CORONARY ARTERY BYPASS GRAFT times three            with left internal mammary artery and right leg saphenous vein;  Surgeon: Alleen Borne, MD;  Location: [facility] OR;  Service: Open Heart Surgery;  Laterality: N/A;  . cosmetic breast surgery    . JOINT REPLACEMENT    . KNEE ARTHROSCOPY Left   . TEE WITHOUT CARDIOVERSION  06/04/2015   Procedure: TRANSESOPHAGEAL ECHOCARDIOGRAM (TEE);  Surgeon: Alleen Borne, MD;  Location: [facility] OR;  Service: Open Heart Surgery;;  . TOTAL KNEE ARTHROPLASTY Left   . TOTAL KNEE ARTHROPLASTY Right 06/06/2013   Procedure: RIGHT TOTAL KNEE ARTHROPLASTY;  Surgeon: Loanne Drilling, MD;  Location: [facility] ORS;  Service: Orthopedics;  Laterality: Right;   HPI:  72 y.o. female with history of CHF, bradycardia, COPD, HTN, HLD, obesity, NSTEMI, CAD s/p CABG, RA, PF admitted for confusion. CCT: Acute hemorrhage posterior to the right splenium of corpus callosum measuring up to 2.4 cm, 2 cc. Mild associated edema and mass effect.    Assessment / Plan / Recommendation Clinical Impression  Pt's cognitive-communicative function has returned to baseline.  Pt is fully oriented.  Working [employer] fast streamer and higher-level attention is intact.  Pt engaged in map interpretation, directional orientation tasks with 100% accuracy, including ability to take on other's perspective.   She completed mathematic equations simulating check-writing with excellent problem-solving and self-awareness/assessment.  No deficits identified - no f/u needed.  SLP will sign off.     SLP Assessment  SLP Recommendation/Assessment: Patient does not need any further Speech Lanaguage Pathology Services SLP Visit Diagnosis: Cognitive communication deficit (R41.841)    Follow Up Recommendations  None    Frequency and Duration  SLP Evaluation Cognition  Overall Cognitive Status: Within Functional Limits for tasks assessed Arousal/Alertness: Awake/alert Orientation Level: Oriented X4 Attention: Alternating Alternating Attention: Appears intact Memory: Appears intact Awareness: Appears intact Problem Solving: Appears intact Executive Function: Self Correcting;Decision Making Decision Making: Appears intact Self Correcting: Appears intact Safety/Judgment: Appears intact       Comprehension  Auditory Comprehension Overall Auditory Comprehension: Appears within functional limits for tasks assessed Reading Comprehension Reading Status: Within funtional limits    Expression Expression Primary Mode of Expression: Verbal Verbal Expression Overall Verbal Expression: Appears within functional limits for tasks assessed Written Expression Dominant Hand: Right Written Expression: Within Functional Limits   Oral / Motor  Oral Motor/Sensory Function Overall Oral Motor/Sensory Function: Within functional limits Motor Speech Overall Motor Speech: Appears within functional limits for tasks assessed   GO                    Blenda Mounts Laurice 08/27/2018, 1:47 PM   Kelwin Gibler L. Samson Frederic, MA CCC/SLP Acute Rehabilitation Services Office number [phone] Pager [phone]

## 2018-08-27 NOTE — Progress Notes (Signed)
Occupational Therapy Treatment Patient Details Name: [name] MRN: [id number] DOB: 1946-02-27 Today's Date: 08/27/2018    History of present illness 72 y.o. 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 CT of the head was performed that showed a small area of hemorrhage in the right splenium of the corpus callosum of about 2 cc with mild associated edema and mass-effect locally.    OT comments  Pt attempted to complete a trail making task around hospital. Pt had significant difficulty using landmarks as cues for direction. Discussed importance of refraining from driving. Pt/daughter verbalized understanding. Continue to recommend follow up with OT at the neuro outpt center to monitor progress and help assess ability to return safely to driving.   Follow Up Recommendations  Outpatient OT;Supervision - Intermittent    Equipment Recommendations  None recommended by OT    Recommendations for Other Services      Precautions / Restrictions Precautions Precautions: Other (comment) Precaution Comments: pt uses O2 at home 2 L, and monitor BPs (goal per Neuro note is <160 systolic)       Mobility Bed Mobility                  Transfers                      Balance                                           ADL either performed or assessed with clinical judgement   ADL                                               Vision       Perception     Praxis      Cognition Arousal/Alertness: Awake/alert Behavior During Therapy: WFL for tasks assessed/performed Overall Cognitive Status:  Within Functional Limits for tasks assessed                                 General Comments: good acknowledgement of deficits        Exercises     Shoulder Instructions       General Comments  Pt taken on trail making task. Pt wheeled to 3rd floor. Landmarks pointed out along way. Pt heavily reliant on use of signs however, demonstrated significant difficulty with beginning trail back. Pt tried going down on elevator from 3rd floor instead of back up to 3rd floor. Pt able to use signs to get back to ICU entrance. Once back to entrance, pt unable to recall/visualize whether nurses station was on her R/L. When attempting to problem solve, pt stated " I just can't see it".     Pertinent Vitals/ Pain       Pain Assessment: No/denies pain  Home Living     Available Help at Discharge: Family;Available PRN/intermittently Type of Home: House  Lives With: Alone    Prior Functioning/Environment              Frequency           Progress Toward Goals  OT Goals(current goals can now be found in the care plan section)  Progress towards OT goals: Progressing toward goals  Acute Rehab OT Goals Patient Stated Goal: to return home, return to normal OT Goal Formulation: With patient/family Time For Goal Achievement: 09/09/18 Potential to Achieve Goals: Good ADL Goals Additional ADL Goal #1: Pt will complete 3 step trail making task with S Additional ADL Goal #2: Pt will independently verbalize precuations/risks regarding disorientation  Plan Discharge plan remains appropriate    Co-evaluation                 AM-PAC PT "6 Clicks" Daily Activity     Outcome Measure   Help from another person eating meals?: None Help from another person taking care of personal grooming?: None Help from another person toileting, which includes using toliet, bedpan, or urinal?: None Help from another person bathing (including  washing, rinsing, drying)?: None Help from another person to put on and taking off regular upper body clothing?: None Help from another person to put on and taking off regular lower body clothing?: None 6 Click Score: 24    End of Session Equipment Utilized During Treatment: Oxygen(2L)  OT Visit Diagnosis: Other symptoms and signs involving cognitive function   Activity Tolerance Patient tolerated treatment well   Patient Left in bed;with call bell/phone within reach;with family/visitor present   Nurse Communication Other (comment)(DC needs)        Time: 1355-1430 OT Time Calculation (min): 35 min  Charges: OT General Charges $OT Visit: 1 Visit OT Treatments $Therapeutic Activity: 23-37 mins  Luisa Dago, OT/L   Acute OT Clinical Specialist Acute Rehabilitation Services Pager [phone] Office [phone]    [facility] 08/27/2018, 2:39 PM

## 2018-08-27 NOTE — Discharge Instructions (Signed)
You were admitted for bleeding in the brain. It was small bleeding so you improved well. We had work up on the bleeding, no specific cause found but we think it likely related to your high BP. We increased your losartan to 50mg  daily. Continue lasix at home Monitoring BP at home, daily or twice daily and write down and for at least 2-3 weeks and bring over to your primary card doctor for review and adjust BP medication as needed.  Decrease lipitor to 40mg  a day.  You need to follow up with primary care doctor within 1-2 weeks for hospital discharge follow up We will see you in our stroke clinic within 4 weeks after discharge. Clinic will call you but if you do not get call from them in 2 weeks, please call the number on the discharge paper.  Heart healthy diet and gradually increase activity.  No definite restriction for driving, but recommend to drive with family members on board for 2-3 times initially. If both parties feel comfortable of your driving, you can drive alone after. However, you are recommended to drive during the day not at night, no long distance and drive in familiar roads.

## 2018-08-27 NOTE — Discharge Summary (Signed)
Stroke Discharge Summary  Patient ID: [name]   MRN: [id number]      DOB: 1946-09-24  Date of Admission: 08/25/2018 Date of Discharge: 08/27/2018  Attending Physician:  Marvel Plan, MD, Stroke MD Consultant(s):    None  Patient's PCP:  Gordy Savers, MD  DISCHARGE DIAGNOSIS:  Right splenium of CC small ICH Hypertensive emergency  Active Problems:   HTN   HLD   Morbid obesity   RA   Pulmonary fibrosis   COPD   CAD s/p CABG   Past Medical History:  Diagnosis Date  . Acute bronchitis 09/25/2015  . ACUTE ON CHRONIC DIASTOLIC HEART FAILURE 12/04/2010   Qualifier: Diagnosis of  By: Ladona Ridgel, MD, [facility], Vergia Alcon   . Arthritis    OA RIGHT KNEE WITH PAIN  . Barrett esophagus   . Bradycardia 06/01/2015  . Chronic respiratory failure (HCC)   . Chronic respiratory failure with hypoxia and hypercapnia (HCC) 02/04/2010   Followed in Pulmonary clinic/ [facility]/ [facility]       - 02 dependent  since 07/02/10 >>  83% RA December 05, 2010       - ONO RA 08/05/12  :  Positive sat < 89 x 2:25m> repeat on 2lpm rec 08/12/2012  - 06/17/2013 reported desat with activity p Knee surgery > rec restart 2lpm with activity  - 06/27/2013   Walked 2lpm  x one lap @ 185 stopped due to sat 88% not sob , desat to 82% on RA just at th  . COPD (chronic obstructive pulmonary disease) (HCC)   . COPD III spirometry if use FEV1/VC p saba  07/18/2010   Quit smoking May 2006       - PFT's  04/12/10 FEV1  1.21 (69%) ratio 77 and no change p B2,  DLC0 56%   VC 70%         - PFTs  08/08/2013 FEV1 1.21 (60%) ratio 86 and no change p B2 DLCO 79%  VC 72%  On symbicort 160 2bid  - PFT's  02/08/2018  FEV1 0.70 (40 % ) ratio 56 if use FEV1/VC  p 38 % improvement from saba p symb 160 prior to study with DLCO  78 % corrects to 147  % for alv volume   - 02/08/2018  . Cough variant asthma 02/26/2011   Followed in Pulmonary clinic/ [facility]/ [facility]  - PFT's  06/04/15  FEV1 1.20 (67 % ) ratio 83  p 6 % improvement  from saba with DLCO  80 % corrects to 132 % for alv volume      - Clinical dx based on response to symbicort       FENO 09/16/2016  =   96 on symbicort 160 2bid > added singulair  Allergy profile 09/16/2016 >  Eos 0.5 /  IgE  78 neg RAST  -  Referred to rehab 04/29/2017 > completed  . DOE (dyspnea on exertion) 02/19/2016  . Essential hypertension 04/20/2007   Qualifier: Diagnosis of  By: Marcelyn Ditty RN, Katy Fitch   . GERD (gastroesophageal reflux disease)   . History of ARDS 2006  . History of home oxygen therapy    AT NIGHT WHEN SLEEPING 2 L / MIN NASAL CANNULA  . Hyperlipidemia 07/12/2015  . Hypertension   . Hypothyroidism   . Morbid (severe) obesity due to excess calories (HCC) 04/22/2015   pfts with erv 14% 06/04/15  And 33% 02/08/2018   . NSTEMI (non-ST  elevated myocardial infarction) (HCC) 05/31/2015  . Pneumococcal pneumonia (HCC) 2006   HOSPITALIZED AND DEVELOPED ARDS  . Psoriatic arthritis (HCC)   . Pulmonary fibrosis (HCC)   . PULMONARY FIBROSIS ILD POST INFLAMMATORY CHRONIC 07/18/2010   Followed as Primary Care Patient/  Healthcare/ [facility]  -s/p ARDS 2006 with bacteremic S  Pna       - CT chest 07/03/10 Nonspecific PF mostly upper lobes       - CT chest 12/03/10 acute gg changes and effusions c/w chf - PFT's  02/08/2018  FVC 0.64 (28 %)   with DLCO  78 % corrects to 147 % for alv volume     . Rheumatic disease   . 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 SOB (shortness of breath) on exertion    Past Surgical History:  Procedure Laterality Date  . ABDOMINOPLASTY    . CARDIAC CATHETERIZATION N/A 06/01/2015   Procedure: Left Heart Cath and Coronary Angiography;  Surgeon: Runell Gess, MD;  Location: [facility] INVASIVE CV LAB;  Service: Cardiovascular;  Laterality: N/A;  . CARPAL TUNNEL RELEASE    . CHOLECYSTECTOMY    . CORONARY ARTERY BYPASS GRAFT N/A 06/04/2015   Procedure: CORONARY ARTERY BYPASS GRAFT times three            with left internal mammary artery and right leg saphenous vein;  Surgeon: Alleen Borne,  MD;  Location: [facility] OR;  Service: Open Heart Surgery;  Laterality: N/A;  . cosmetic breast surgery    . JOINT REPLACEMENT    . KNEE ARTHROSCOPY Left   . TEE WITHOUT CARDIOVERSION  06/04/2015   Procedure: TRANSESOPHAGEAL ECHOCARDIOGRAM (TEE);  Surgeon: Alleen Borne, MD;  Location: [facility] OR;  Service: Open Heart Surgery;;  . TOTAL KNEE ARTHROPLASTY Left   . TOTAL KNEE ARTHROPLASTY Right 06/06/2013   Procedure: RIGHT TOTAL KNEE ARTHROPLASTY;  Surgeon: Loanne Drilling, MD;  Location: [facility] ORS;  Service: Orthopedics;  Laterality: Right;    Allergies as of 08/27/2018   No Known Allergies   LABORATORY STUDIES CBC    Component Value Date/Time   WBC 8.3 08/27/2018 0605   RBC 4.06 08/27/2018 0605   HGB 11.0 (L) 08/27/2018 0605   HGB 11.9 06/23/2018 1559   HCT 38.1 08/27/2018 0605   HCT 38.9 06/23/2018 1559   PLT 201 08/27/2018 0605   PLT 199 06/23/2018 1559   MCV 93.8 08/27/2018 0605   MCV 93 06/23/2018 1559   MCH 27.1 08/27/2018 0605   MCHC 28.9 (L) 08/27/2018 0605   RDW 14.1 08/27/2018 0605   RDW 14.0 06/23/2018 1559   LYMPHSABS 2.2 08/25/2018 1759   LYMPHSABS 2.2 06/23/2018 1559   MONOABS 0.7 08/25/2018 1759   EOSABS 0.6 (H) 08/25/2018 1759   EOSABS 0.8 (H) 06/23/2018 1559   BASOSABS 0.1 08/25/2018 1759   BASOSABS 0.0 06/23/2018 1559   CMP    Component Value Date/Time   NA 144 08/27/2018 0605   NA 142 06/23/2018 1559   K 4.7 08/27/2018 0605   CL 100 08/27/2018 0605   CO2 37 (H) 08/27/2018 0605   GLUCOSE 124 (H) 08/27/2018 0605   BUN 18 08/27/2018 0605   BUN 16 06/23/2018 1559   CREATININE 0.88 08/27/2018 0605   CREATININE 0.87 09/29/2016 1335   CALCIUM 9.9 08/27/2018 0605   PROT 7.1 08/25/2018 1759   PROT 7.2 06/23/2018 1559   ALBUMIN 3.7 08/25/2018 1759   ALBUMIN 4.3 06/23/2018 1559   AST 21 08/25/2018 1759   ALT 15  08/25/2018 1759   ALKPHOS 64 08/25/2018 1759   BILITOT 0.5 08/25/2018 1759   BILITOT 0.3 06/23/2018 1559   GFRNONAA >60 08/27/2018 0605   GFRAA >60  08/27/2018 0605   COAGS Lab Results  Component Value Date   INR 0.96 08/25/2018   INR 0.96 08/25/2018   INR 1.30 06/04/2015   Lipid Panel    Component Value Date/Time   CHOL 133 08/25/2018 2019   CHOL 148 11/09/2017 1528   CHOL 117 07/12/2015 1508   TRIG 108 08/25/2018 2019   TRIG 75 07/12/2015 1508   HDL 59 08/25/2018 2019   HDL 67 11/09/2017 1528   HDL 51 07/12/2015 1508   CHOLHDL 2.3 08/25/2018 2019   VLDL 22 08/25/2018 2019   LDLCALC 52 08/25/2018 2019   LDLCALC 58 11/09/2017 1528   LDLCALC 51 07/12/2015 1508   HgbA1C  Lab Results  Component Value Date   HGBA1C 5.3 08/25/2018   Urinalysis    Component Value Date/Time   COLORURINE YELLOW 05/31/2013 1412   APPEARANCEUR CLEAR 05/31/2013 1412   LABSPEC 1.027 05/31/2013 1412   PHURINE 5.0 05/31/2013 1412   GLUCOSEU NEGATIVE 05/31/2013 1412   HGBUR NEGATIVE 05/31/2013 1412   HGBUR negative 07/18/2010 0941   BILIRUBINUR SMALL (A) 05/31/2013 1412   BILIRUBINUR n 08/10/2012 1131   KETONESUR NEGATIVE 05/31/2013 1412   PROTEINUR 30 (A) 05/31/2013 1412   UROBILINOGEN 0.2 05/31/2013 1412   NITRITE NEGATIVE 05/31/2013 1412   LEUKOCYTESUR NEGATIVE 05/31/2013 1412   Urine Drug Screen No results found for: LABOPIA, COCAINSCRNUR, LABBENZ, AMPHETMU, THCU, LABBARB  Alcohol Level No results found for: [facility]   SIGNIFICANT DIAGNOSTIC STUDIES Ct Angio Head W Or Wo Contrast  Result Date: 08/26/2018 CLINICAL DATA:  72 y/o  F; intracranial hemorrhage. EXAM: CT ANGIOGRAPHY HEAD AND NECK TECHNIQUE: Multidetector CT imaging of the head and neck was performed using the standard protocol during bolus administration of intravenous contrast. Multiplanar CT image reconstructions and MIPs were obtained to evaluate the vascular anatomy. Carotid stenosis measurements (when applicable) are obtained utilizing NASCET criteria, using the distal internal carotid diameter as the denominator. CONTRAST:  77mL ISOVUE-370 IOPAMIDOL (ISOVUE-370) INJECTION  76% COMPARISON:  08/25/2018 CT head. FINDINGS: CTA NECK FINDINGS Aortic arch: Standard branching. Imaged portion shows no evidence of aneurysm or dissection. No significant stenosis of the major arch vessel origins. Mild calcific aortic atherosclerosis. Right carotid system: No evidence of dissection, stenosis (50% or greater) or occlusion. Mild non stenotic calcified plaque of the carotid bifurcation. Left carotid system: No evidence of dissection, stenosis (50% or greater) or occlusion. Minimal non stenotic calcified plaque of the carotid bifurcation. Vertebral arteries: Codominant. No evidence of dissection, stenosis (50% or greater) or occlusion. Skeleton: Moderate spondylosis of the cervical spine with multilevel disc and facet degenerative changes greatest at the C5-6 level. Other neck: Negative. Upper chest: Partially visualized healed median sternotomy. Review of the MIP images confirms the above findings CTA HEAD FINDINGS Anterior circulation: No significant stenosis, proximal occlusion, aneurysm, or vascular malformation. Calcific atherosclerosis of carotid siphons. Mild left paraclinoid stenosis. Mild left M1 stenosis. Posterior circulation: No significant stenosis, proximal occlusion, aneurysm, or vascular malformation. Mild-to-moderate left P1 stenosis. Venous sinuses: As permitted by contrast timing, patent. Anatomic variants: None significant. Delayed phase: Stable 9 x 19 mm (AP by ML series 13, image 20) acute hemorrhage posterior to the right splenium of corpus callosum. No abnormal enhancement. Patent dural venous sinuses. Review of the MIP images confirms the above findings IMPRESSION: 1. Stable hemorrhage posterior  to the right splenium of corpus callosum. No abnormal enhancement, vascular malformation, or dural venous sinus thrombosis identified. 2. Patent carotid and vertebral arteries. No dissection, aneurysm, or hemodynamically significant stenosis utilizing NASCET criteria. 3. Patent  anterior and posterior intracranial circulation. No large vessel occlusion, aneurysm, or significant stenosis. 4. Calcific atherosclerosis of the aorta and carotid systems with mild left paraclinoid ICA stenosis. 5. Mild-to-moderate left P1 stenosis. Electronically Signed   By: Mitzi Hansen M.D.   On: 08/26/2018 14:08   Ct Head Wo Contrast  Result Date: 08/25/2018 CLINICAL DATA:  72 y/o  F; confusion. EXAM: CT HEAD WITHOUT CONTRAST TECHNIQUE: Contiguous axial images were obtained from the base of the skull through the vertex without intravenous contrast. COMPARISON:  04/22/2005 CT of the head. FINDINGS: Brain: Acute hemorrhage measuring 0.9 x 1.9 x 2.4 cm (volume = 2 cm^3) centered posterior to the right forceps of splenium of corpus callosum (series 3, image 18). Mild surrounding edema and local mass effect. No additional intracranial hemorrhage, stroke, or focal mass effect identified. Nonspecific white matter hypodensities are compatible with mild chronic microvascular ischemic changes for age and there is mild volume loss. Vascular: Calcific atherosclerosis of the carotid siphons. No hyperdense vessel identified. Skull: Normal. Negative for fracture or focal lesion. Sinuses/Orbits: No acute finding. Other: None. IMPRESSION: 1. Acute hemorrhage posterior to the right splenium of corpus callosum measuring up to 2.4 cm, 2 cc. Mild associated edema and mass effect. 2. No additional acute intracranial abnormality identified. 3. Mild chronic microvascular ischemic changes and volume loss of the brain for age. Critical Value/emergent results were called by telephone at the time of interpretation on 08/25/2018 at 6:55 pm to NP HOPE NEESE , who verbally acknowledged these results. Electronically Signed   By: Mitzi Hansen M.D.   On: 08/25/2018 19:00   Ct Angio Neck W Or Wo Contrast  Result Date: 08/26/2018 CLINICAL DATA:  72 y/o  F; intracranial hemorrhage. EXAM: CT ANGIOGRAPHY HEAD AND  NECK TECHNIQUE: Multidetector CT imaging of the head and neck was performed using the standard protocol during bolus administration of intravenous contrast. Multiplanar CT image reconstructions and MIPs were obtained to evaluate the vascular anatomy. Carotid stenosis measurements (when applicable) are obtained utilizing NASCET criteria, using the distal internal carotid diameter as the denominator. CONTRAST:  50mL ISOVUE-370 IOPAMIDOL (ISOVUE-370) INJECTION 76% COMPARISON:  08/25/2018 CT head. FINDINGS: CTA NECK FINDINGS Aortic arch: Standard branching. Imaged portion shows no evidence of aneurysm or dissection. No significant stenosis of the major arch vessel origins. Mild calcific aortic atherosclerosis. Right carotid system: No evidence of dissection, stenosis (50% or greater) or occlusion. Mild non stenotic calcified plaque of the carotid bifurcation. Left carotid system: No evidence of dissection, stenosis (50% or greater) or occlusion. Minimal non stenotic calcified plaque of the carotid bifurcation. Vertebral arteries: Codominant. No evidence of dissection, stenosis (50% or greater) or occlusion. Skeleton: Moderate spondylosis of the cervical spine with multilevel disc and facet degenerative changes greatest at the C5-6 level. Other neck: Negative. Upper chest: Partially visualized healed median sternotomy. Review of the MIP images confirms the above findings CTA HEAD FINDINGS Anterior circulation: No significant stenosis, proximal occlusion, aneurysm, or vascular malformation. Calcific atherosclerosis of carotid siphons. Mild left paraclinoid stenosis. Mild left M1 stenosis. Posterior circulation: No significant stenosis, proximal occlusion, aneurysm, or vascular malformation. Mild-to-moderate left P1 stenosis. Venous sinuses: As permitted by contrast timing, patent. Anatomic variants: None significant. Delayed phase: Stable 9 x 19 mm (AP by ML series 13, image 20) acute  hemorrhage posterior to the right  splenium of corpus callosum. No abnormal enhancement. Patent dural venous sinuses. Review of the MIP images confirms the above findings IMPRESSION: 1. Stable hemorrhage posterior to the right splenium of corpus callosum. No abnormal enhancement, vascular malformation, or dural venous sinus thrombosis identified. 2. Patent carotid and vertebral arteries. No dissection, aneurysm, or hemodynamically significant stenosis utilizing NASCET criteria. 3. Patent anterior and posterior intracranial circulation. No large vessel occlusion, aneurysm, or significant stenosis. 4. Calcific atherosclerosis of the aorta and carotid systems with mild left paraclinoid ICA stenosis. 5. Mild-to-moderate left P1 stenosis. Electronically Signed   By: Mitzi Hansen M.D.   On: 08/26/2018 14:08   Mr Laqueta Jean And Wo Contrast  Result Date: 08/26/2018 CLINICAL DATA:  72 y/o  F; intracranial hemorrhage. EXAM: MRI HEAD WITHOUT AND WITH CONTRAST TECHNIQUE: Multiplanar, multiecho pulse sequences of the brain and surrounding structures were obtained without and with intravenous contrast. CONTRAST:  9 cc Gadavist COMPARISON:  08/25/2018 CT head.  08/26/2018 CTA head. FINDINGS: Brain: 9 x 19 mm mixed T1 and low T2 signal hemorrhage posterior to the right splenium of corpus callosum extending to the periatrial white matter compatible with late late acute/early subacute hemorrhage is stable (series 10, image 50). Stable small surrounding region of edema with local mass effect. No additional focus of susceptibility hypointensity to indicate intracranial hemorrhage. No reduced diffusion to suggest acute or early subacute infarction. Nonspecific T2 FLAIR hyperintensities in subcortical and periventricular white matter as well as the pons are compatible with mild chronic microvascular ischemic changes for age. Mild volume loss of the brain. No extra-axial collection, hydrocephalus, or herniation. After administration of intravenous contrast  there is no abnormal enhancement of the brain. Vascular: Normal flow voids. Skull and upper cervical spine: Normal marrow signal. Sinuses/Orbits: Bilateral intra-ocular lens replacement. No significant abnormal signal of paranasal sinuses or mastoid air cells. Other: None. IMPRESSION: 1. Stable late acute/early subacute hemorrhage posterior to the right splenium of corpus callosum. Stable small area of surrounding edema and local mass effect. 2. No new acute intracranial abnormality identified. No abnormal enhancement of the brain. 3. Mild for age chronic microvascular ischemic changes and volume loss of the brain. Electronically Signed   By: Mitzi Hansen M.D.   On: 08/26/2018 21:17   Dg Bone Density  Result Date: 08/17/2018 EXAM: DUAL X-RAY ABSORPTIOMETRY (DXA) FOR BONE MINERAL DENSITY IMPRESSION: Referring Physician:  Donnetta Hail Your patient completed a BMD test using Lunar IDXA DXA system ( analysis version: 16 ) manufactured by [employer]. Technologist: AW PATIENT: Name: Delbert, Vu Patient ID: [id number] Birth Date: August 23, 1946 Height: 58.0 in. Sex: Female Measured: 08/17/2018 Weight: 206.2 lbs. Indications: Advanced Age, Albuterol, Caucasian, Celexa, Estrogen Deficient, Hypothyroid, Levothyroxine, Pantoprazole, Postmenopausal, Psoriatic arthritis Fractures: None Treatments: Calcium (E943.0), Vitamin D (E933.5) ASSESSMENT: The BMD measured at Femur Neck Left is 0.769 g/cm2 with a T-score of -1.9. This patient is considered osteopenic according to World Health Organization [facility]) criteria. Lumbar spine results are falsely elevated by diffuse degenerative changes. Site Region Measured Date Measured Age YA BMD Significant CHANGE T-score DualFemur Neck Left  08/17/2018    72.4         -1.9    0.769 g/cm2 AP Spine  L1-L4      08[age]17/2018    72.4         0.8     1.281 g/cm2 DualFemur Total Mean 08/17/2018    71.4         -1.7  0.796 g/cm2 World Health Organization [facility]) criteria for  post-menopausal, Caucasian Women: Normal       T-score at or above -1 SD Osteopenia   T-score between -1 and -2.5 SD Osteoporosis T-score at or below -2.5 SD RECOMMENDATION: 1. All patients should optimize calcium and vitamin D intake. 2. Consider FDA approved medical therapies in postmenopausal women and men aged 12 years and older, based on the following: a. A hip or vertebral (clinical or morphometric) fracture b. T- score < or = -2.5 at the femoral neck or spine after appropriate evaluation to exclude secondary causes c. Low bone mass (T-score between -1.0 and -2.5 at the femoral neck or spine) and a 10 year probability of a hip fracture > or = 3% or a 10 year probability of a major osteoporosis-related fracture > or = 20% based on the [place]-adapted WHO algorithm d. Clinician judgment and/or patient preferences may indicate treatment for people with 10-year fracture probabilities above or below these levels FOLLOW-UP: People with diagnosed cases of osteoporosis or at high risk for fracture should have regular bone mineral density tests. For patients eligible for Medicare, routine testing is allowed once every 2 years. The testing frequency can be increased to one year for patients who have rapidly progressing disease, those who are receiving or discontinuing medical therapy to restore bone mass, or have additional risk factors. FRAX* 10-year Probability of Fracture Based on femoral neck BMD: DualFemur (Left) Major Osteoporotic Fracture: 10.3% Hip Fracture:                1.9% Population:                  [place] (Caucasian) Risk Factors:                None *FRAX is a Armed forces logistics/support/administrative officer of the [employer] of [employer] for Metabolic Bone Disease, a World Science writer (WHO) [employer]. ASSESSMENT: The probability of a major osteoporotic fracture is 10.3 % within the next ten years. The probability of hip fracture is  1.9  % within the next 10 years. Electronically Signed   By: Myles Rosenthal  M.D.   On: 08/17/2018 15:19      HISTORY OF PRESENT ILLNESS  Skila Angeletti is a 72 y.o. female was a past medical history of hypertension, COPD, tobacco abuse quit smoking 2006, coronary artery disease with history of CABG, pulmonary fibrosis hypothyroidism, obesity, rheumatoid arthritis, psoriatic arthritis, osteoarthritis who was in her usual state of health about 1 to 2 days ago when she started noticing some headaches and some abnormal symptoms which were out of character for her.  She was attempting to drive to her doctor's office and could not remember how to get there although she is doing to the primary care's office multiple times.  Today, she went to the bank and completed a transaction at the bank and could not remember how to get out of the bank. This is very atypical for her and was concerning enough for the family to bring her to the emergency room. In the emergency room, noncontrast CT of the head was performed that showed a small area of hemorrhage in the right splenium of the corpus callosum of about 2 cc with mild associated edema and mass-effect locally. Neurological consultation was obtained for admission for further work-up. Patient denies any chronic headaches but reports that over the past 2 to 3 weeks has been having some headaches.  She also has been having  issues with her blood pressure where her blood pressures became very low and her doctors changed her antihypertensives and half the doses.  She denies any focal neurological deficits over the past few days to weeks.  She denies any prior history of strokes.  She denies any visual symptoms.  Denies any chest pain but reports continuous ongoing shortness of breath more so on exertion and laying down.   LKW: 1 to 2 days ago tpa given?: no, ICH Premorbid modified Rankin scale (mRS): 2- due to COPD and dyspnea on exertion  HOSPITAL COURSE Ms. Kynnlee Darrell is a 72 y.o. female with history of CHF, bradycardia,  COPD, HTN, HLD, obesity, NSTEMI, CAD s/p CABG, RA, PF admitted for confusion. No tPA given due to ICH.    ICH:  right CC splenium small ICH - etiology unclear, MRI w/wo pending  Resultant transient confusion  MRI w/wo right CC splenium ICH, no infarct or tumor  CTA head and neck no AVM or aneurysm  CT stable ICH  2D Echo EF 65-70%  LDL 52  HgbA1c 5.3  SCDs for VTE prophylaxis  aspirin 81 mg daily prior to admission, now on No antithrombotic. Recommend to repeat CT in 2-3 weeks. If blood absorbed, consider to resume ASA.  Patient counseled to be compliant with her antithrombotic medications  Ongoing aggressive stroke risk factor management  Therapy recommendations:  pending  Disposition:  pending  Hypertensive emergency  Stable   BP goal < 160  Off cleviprex   Resume home lasix and increased losartan to 50mg   Long term goal normotensive  Hyperlipidemia  Home meds:  lipitor 80   LDL 52, goal < 70  Decrease lipitor 40 at discharge  Other Stroke Risk Factors  Advanced age  Cigarette smoker quit in 2006  Morbid Obesity, Body mass index is 41.41 kg/m.   Coronary artery disease / MI s/p CABG  CHF  Other Active Problems  COPD  Pulmonary fibrosis  Rheumatoid arthritis ?  bradycardia   DISCHARGE EXAM Blood pressure 135/64, pulse 77, temperature 98.3 F (36.8 C), temperature source Oral, resp. rate 20, height 4\' 11"  (1.499 m), weight 93 kg, SpO2 97 %.  General - Well nourished, well developed, in no apparent distress.  Ophthalmologic - fundi not visualized due to noncooperation.  Cardiovascular - Regular rate and rhythm.  Mental Status -  Level of arousal and orientation to time, place, and person were intact. Language including expression, naming, repetition, comprehension was assessed and found intact. Fund of Knowledge was assessed and was intact.  Cranial Nerves II - XII - II - Visual field intact OU. III, IV, VI -  Extraocular movements intact. V - Facial sensation intact bilaterally. VII - Facial movement intact bilaterally. VIII - Hearing & vestibular intact bilaterally. X - Palate elevates symmetrically. XI - Chin turning & shoulder shrug intact bilaterally. XII - Tongue protrusion intact.  Motor Strength - The patient's strength was normal in all extremities and pronator drift was absent.  Bulk was normal and fasciculations were absent.   Motor Tone - Muscle tone was assessed at the neck and appendages and was normal.  Reflexes - The patient's reflexes were symmetrical in all extremities and she had no pathological reflexes.  Sensory - Light touch, temperature/pinprick were assessed and were symmetrical.    Coordination - The patient had normal movements in the hands and feet with no ataxia or dysmetria.  Tremor was absent.  Gait and Station - normal gait  Discharge Diet  Diet Order            Diet - low sodium heart healthy        Diet heart healthy/carb modified Room service appropriate? Yes; Fluid consistency: Thin  Diet effective now             liquids  DISCHARGE PLAN  Disposition:  home  No antithrombotic for secondary stroke prevention for now  Ongoing risk factor control by Primary Care Physician at time of discharge  Follow-up Gordy Savers, MD in 2 weeks.  Follow-up in [facility] in 4 weeks, office to schedule an appointment.   D/C instruction to pt You were admitted for bleeding in the brain. It was small bleeding so you improved well. We had work up on the bleeding, no specific cause found but we think it likely related to your high BP. We increased your losartan to 50mg  daily. Continue lasix at home Monitoring BP at home, daily or twice daily and write down and for at least 2-3 weeks and bring over to your primary card doctor for review and adjust BP medication as needed.  Decrease lipitor to 40mg  a day.  You need  to follow up with primary care doctor within 1-2 weeks for hospital discharge follow up We will see you in our stroke clinic within 4 weeks after discharge. Clinic will call you but if you do not get call from them in 2 weeks, please call the number on the discharge paper.  Heart healthy diet and gradually increase activity.  No definite restriction for driving, but recommend to drive with family members on board for 2-3 times initially. If both parties feel comfortable of your driving, you can drive alone after. However, you are recommended to drive during the day not at night, no long distance and drive in familiar roads.   40 minutes were spent preparing discharge.  , MD PhD Stroke Neurology 08/27/2018 3:45 PM

## 2018-08-27 NOTE — Progress Notes (Signed)
Pt VSS. Discharge instructions reviewed with patient and patient's daughter, Cala Bradford at bedside. All questions answered. Pt will follow up and will schedule appt. All belongings returned to pt. PIV removed, site CDI.

## 2018-08-27 NOTE — Progress Notes (Signed)
Physical Therapy Treatment Patient Details Name: [name] MRN: [id number] DOB: 1946/10/10 Today's Date: 08/27/2018    History of Present Illness 72 y.o. 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 CT of the head was performed that showed a small area of hemorrhage in the right splenium of the corpus callosum of about 2 cc with mild associated edema and mass-effect locally.     PT Comments    Much improved.  Mildly unsteady at times with scanning, but improved with time up and expect improvement with more mobility.   Follow Up Recommendations  No PT follow up;Supervision/Assistance - 24 hour     Equipment Recommendations  None recommended by PT    Recommendations for Other Services       Precautions / Restrictions Precautions Precautions: Other (comment) Precaution Comments: pt uses O2 at home 2 L, and monitor BPs (goal per Neuro note is <160 systolic)    Mobility  Bed Mobility Overal bed mobility: Modified Independent                Transfers Overall transfer level: Modified independent                  Ambulation/Gait Ambulation/Gait assistance: Supervision Gait Distance (Feet): 300 Feet Assistive device: None Gait Pattern/deviations: Step-through pattern Gait velocity: able to increase speed from slow, but not significantly Gait velocity interpretation: 1.31 - 2.62 ft/sec, indicative of limited community ambulator General Gait Details: initially, mildly unsteady, but became more steady with distance even with scanning and abrupt directional changers.   Stairs Stairs: Yes Stairs assistance: Min  assist Stair Management: One rail Right;Alternating pattern;Forwards Number of Stairs: 4 General stair comments: uses 2 rails generally so needed min assist.   Wheelchair Mobility    Modified Rankin (Stroke Patients Only) Modified Rankin (Stroke Patients Only) Pre-Morbid Rankin Score: No symptoms Modified Rankin: No significant disability     Balance Overall balance assessment: Needs assistance Sitting-balance support: Feet supported;No upper extremity supported Sitting balance-[name]: Good       Standing balance-[name]: Good                              Cognition Arousal/Alertness: Awake/alert Behavior During Therapy: WFL for tasks assessed/performed Overall Cognitive Status: Within Functional Limits for tasks assessed Area of Impairment: Memory                               General Comments: good acknowledgement of deficits      Exercises      General Comments General comments (skin integrity, edema, etc.): 2/4 DOE on 3L Bells,  sats mid 90's o/w VSS      Pertinent Vitals/Pain Pain Assessment: No/denies pain    Home Living     Available Help at Discharge: Family;Available PRN/intermittently Type of Home: House              Prior Function            PT Goals (current goals can now be found in the care plan section) Acute Rehab PT Goals Patient Stated Goal: to return home, return to  normal PT Goal Formulation: With patient Time For Goal Achievement: 09/09/18 Potential to Achieve Goals: Good Progress towards PT goals: Progressing toward goals    Frequency    Min 4X/week      PT Plan Current plan remains appropriate    Co-evaluation              AM-PAC PT "6 Clicks" Daily Activity  Outcome Measure  Difficulty turning over in bed (including adjusting bedclothes, sheets and blankets)?: None Difficulty moving from lying on back to sitting on the side of the bed? : None Difficulty sitting down on and  standing up from a chair with arms (e.g., wheelchair, bedside commode, etc,.)?: None Help needed moving to and from a bed to chair (including a wheelchair)?: A Little Help needed walking in hospital room?: A Little Help needed climbing 3-5 steps with a railing? : A Little 6 Click Score: 21    End of Session Equipment Utilized During Treatment: Oxygen Activity Tolerance: Patient tolerated treatment well Patient left: in bed;with call bell/phone within reach;with family/visitor present   PT Visit Diagnosis: Difficulty in walking, not elsewhere classified (R26.2);Other symptoms and signs involving the nervous system (R29.898)     Time: 1220-1240 PT Time Calculation (min) (ACUTE ONLY): 20 min  Charges:  $Gait Training: 8-22 mins                     08/27/2018  Moyock Bing, PT Acute Rehabilitation Services [phone]  (pager) [phone]  (office)   [name] [name] 08/27/2018, 3:10 PM

## 2018-08-31 ENCOUNTER — Ambulatory Visit (INDEPENDENT_AMBULATORY_CARE_PROVIDER_SITE_OTHER): Payer: Medicare Other | Admitting: Nurse Practitioner

## 2018-08-31 ENCOUNTER — Ambulatory Visit (INDEPENDENT_AMBULATORY_CARE_PROVIDER_SITE_OTHER)
Admission: RE | Admit: 2018-08-31 | Discharge: 2018-08-31 | Disposition: A | Discharge status: Home / Self Care | Payer: Medicare Other | Source: Ambulatory Visit | Attending: Nurse Practitioner | Admitting: Nurse Practitioner

## 2018-08-31 ENCOUNTER — Encounter: Payer: Self-pay | Admitting: Nurse Practitioner

## 2018-08-31 VITALS — BP 140/74 | HR 89 | Ht <= 58 in | Wt 204.4 lb

## 2018-08-31 DIAGNOSIS — J449 Chronic obstructive pulmonary disease, unspecified: Secondary | ICD-10-CM | POA: Diagnosis not present

## 2018-08-31 DIAGNOSIS — J9612 Chronic respiratory failure with hypercapnia: Secondary | ICD-10-CM | POA: Diagnosis not present

## 2018-08-31 DIAGNOSIS — R059 Cough, unspecified: Secondary | ICD-10-CM

## 2018-08-31 DIAGNOSIS — R05 Cough: Secondary | ICD-10-CM

## 2018-08-31 DIAGNOSIS — J9611 Chronic respiratory failure with hypoxia: Secondary | ICD-10-CM | POA: Diagnosis not present

## 2018-08-31 MED ORDER — DOXYCYCLINE HYCLATE 100 MG PO TABS: 100.0000 mg | ORAL_TABLET | Freq: Two times a day (BID) | ORAL | 0 refills | Status: DC

## 2018-08-31 NOTE — Progress Notes (Signed)
@Patient  ID: [name], female    DOB: 10-21-1946, 72 y.o.   MRN: [id number]  Chief Complaint  Patient presents with  . Cough    Referring provider: Gordy Savers, MD  HPI  72 year old female former smoker with COPD GOLD III, chronic respiratory failure with hypoxia and hypercapnia, and morbid obesity followed by Dr. Sherene Sires.   Tests: CXR - 09/15/18 - Stable mild chronic interstitial lung disease. No active cardiopulmonary disease.  PFT Results Latest Ref Rng & Units 02/08/2018 06/03/2015  FVC-Pre L 0.64 1.45  FVC-Predicted Pre % 28 61  FVC-Post L 0.90 1.45  FVC-Predicted Post % 39 61  Pre FEV1/FVC % % 78 78  Post FEV1/FCV % % 77 83  FEV1-Pre L 0.50 1.13  FEV1-Predicted Pre % 29 63  DLCO UNC% % 78 80  DLCO COR %Predicted % 147 132  TLC L 3.34 4.89  TLC % Predicted % 80 117  RV % Predicted % 109 176   OV 08/31/18 - cough/left side back discomfort Patient presents today with cough productive of thick white sputum and left side back discomfort. She states that symptoms started on 08/27/18 and have progressively worsened. States that the discomfort is from coughing so much, but is concerned because she felt this same way when she had pneumonia last time. She is compliant with medications. Has not taken anything to help relieve the symptoms. She denies any shortness of breath, chest pain, or fever.     No Known Allergies  Immunization History  Administered Date(s) Administered  . Influenza Split 08/04/2011, 09/01/2012, 07/05/2015  . Influenza Whole 09/21/2007, 07/23/2010, 10/03/2016  . Influenza, High Dose Seasonal PF 07/27/2015, 12/24/2017, 08/09/2018  . Influenza-Unspecified 10/03/2013, 09/03/2014  . Pneumococcal Conjugate-13 04/12/2014  . Pneumococcal Polysaccharide-23 07/27/2015  . Pneumococcal-Unspecified 11/03/2004  . Tdap 08/12/2011    Past Medical History:  Diagnosis Date  . Acute bronchitis 09/25/2015  . ACUTE ON CHRONIC DIASTOLIC HEART FAILURE  12/04/2010   Qualifier: Diagnosis of  By: Ladona Ridgel, MD, [facility], [place] Alcon   . Arthritis    OA RIGHT KNEE WITH PAIN  . Barrett esophagus   . Bradycardia 06/01/2015  . Chronic respiratory failure (HCC)   . Chronic respiratory failure with hypoxia and hypercapnia (HCC) 02/04/2010   Followed in Pulmonary clinic/ [facility]/ [facility]       - 02 dependent  since 07/02/10 >>  83% RA December 05, 2010       - ONO RA 08/05/12  :  Positive sat < 89 x 2:35m> repeat on 2lpm rec 08/12/2012  - 06/17/2013 reported desat with activity p Knee surgery > rec restart 2lpm with activity  - 06/27/2013   Walked 2lpm  x one lap @ 185 stopped due to sat 88% not sob , desat to 82% on RA just at th  . COPD (chronic obstructive pulmonary disease) (HCC)   . COPD III spirometry if use FEV1/VC p saba  07/18/2010   Quit smoking May 2006       - PFT's  04/12/10 FEV1  1.21 (69%) ratio 77 and no change p B2,  DLC0 56%   VC 70%         - PFTs  08/08/2013 FEV1 1.21 (60%) ratio 86 and no change p B2 DLCO 79%  VC 72%  On symbicort 160 2bid  - PFT's  02/08/2018  FEV1 0.70 (40 % ) ratio 56 if use FEV1/VC  p 38 % improvement from saba p symb 160  prior to study with DLCO  78 % corrects to 147  % for alv volume   - 02/08/2018  . Cough variant asthma 02/26/2011   Followed in Pulmonary clinic/ [facility]/ [facility]  - PFT's  06/04/15  FEV1 1.20 (67 % ) ratio 83  p 6 % improvement from saba with DLCO  80 % corrects to 132 % for alv volume      - Clinical dx based on response to symbicort       FENO 09/16/2016  =   96 on symbicort 160 2bid > added singulair  Allergy profile 09/16/2016 >  Eos 0.5 /  IgE  78 neg RAST  -  Referred to rehab 04/29/2017 > completed  . DOE (dyspnea on exertion) 02/19/2016  . Essential hypertension 04/20/2007   Qualifier: Diagnosis of  By: Marcelyn Ditty RN, Katy Fitch   . GERD (gastroesophageal reflux disease)   . History of ARDS 2006  . History of home oxygen therapy    AT NIGHT WHEN SLEEPING 2 L / MIN NASAL CANNULA  . Hyperlipidemia  07/12/2015  . Hypertension   . Hypothyroidism   . Morbid (severe) obesity due to excess calories (HCC) 04/22/2015   pfts with erv 14% 06/04/15  And 33% 02/08/2018   . NSTEMI (non-ST elevated myocardial infarction) (HCC) 05/31/2015  . Pneumococcal pneumonia (HCC) 2006   HOSPITALIZED AND DEVELOPED ARDS  . Psoriatic arthritis (HCC)   . Pulmonary fibrosis (HCC)   . PULMONARY FIBROSIS ILD POST INFLAMMATORY CHRONIC 07/18/2010   Followed as Primary Care Patient/ [facility]/ [facility]  -s/p ARDS 2006 with bacteremic S  Pna       - CT chest 07/03/10 Nonspecific PF mostly upper lobes       - CT chest 12/03/10 acute gg changes and effusions c/w chf - PFT's  02/08/2018  FVC 0.64 (28 %)   with DLCO  78 % corrects to 147 % for alv volume     . Rheumatic disease   . 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 SOB (shortness of breath) on exertion     Tobacco History: Social History   Tobacco Use  Smoking Status Former Smoker  . Packs/day: 1.50  . Years: 58.50  . Pack years: 87.75  . Types: Cigarettes  . Last attempt to quit: 03/03/2005  . Years since quitting: 13.5  Smokeless Tobacco Never Used   Counseling given: Yes   Outpatient Encounter Medications as of 08/31/2018  Medication Sig  . acetaminophen (TYLENOL ARTHRITIS PAIN) 650 MG CR tablet Take 1,300 mg by mouth every 8 (eight) hours as needed for pain. Per bottle directions as needed  . albuterol (PROAIR HFA) 108 (90 Base) MCG/ACT inhaler 2 puffs every 4 hours as needed only  if your can't catch your breath  . atorvastatin (LIPITOR) 40 MG tablet Take 1 tablet (40 mg total) by mouth daily at 6 PM.  . Cholecalciferol (VITAMIN D3) 2000 UNITS TABS Take 2,000 Int'l Units by mouth daily.  . citalopram (CELEXA) 40 MG tablet TAKE 1 TABLET BY MOUTH  EVERY MORNING  . furosemide (LASIX) 20 MG tablet TAKE 1 TABLET BY MOUTH TWO  TIMES DAILY (Patient taking differently: Take 20 mg by mouth daily. )  . Golimumab (SIMPONI ARIA IV) Inject into the vein every 8 (eight) weeks.  Marland Kitchen  levothyroxine (SYNTHROID, LEVOTHROID) 150 MCG tablet TAKE 1 TABLET BY MOUTH  DAILY BEFORE BREAKFAST  . losartan (COZAAR) 50 MG tablet Take 1 tablet (50 mg total) by mouth daily.  Marland Kitchen  montelukast (SINGULAIR) 10 MG tablet TAKE 1 TABLET BY MOUTH AT  BEDTIME  . NON FORMULARY 2lpm 24/7  . OXYGEN Inhale into the lungs. continuous o2  . pantoprazole (PROTONIX) 40 MG tablet TAKE 1 TABLET BY MOUTH  DAILY 30 TO 60 MINUTES  BEFORE FIRST MEAL OF THE  DAY (Patient taking differently: Take 40 mg by mouth daily before breakfast. )  . SYMBICORT 160-4.5 MCG/ACT inhaler USE 2 PUFFS TWO TIMES DAILY (Patient taking differently: Inhale 2 puffs into the lungs 2 (two) times daily. )  . traMADol (ULTRAM) 50 MG tablet TAKE 1-2 TABLETS EVERY 6 HOURS AS NEEDED FOR PAIN  . triamcinolone cream (KENALOG) 0.1 % Apply 1 application topically 2 (two) times daily as needed (for psorisis).  Marland Kitchen doxycycline (VIBRA-TABS) 100 MG tablet Take 1 tablet (100 mg total) by mouth 2 (two) times daily.   No facility-administered encounter medications on file as of 08/31/2018.      Review of Systems  Review of Systems  Constitutional: Negative.  Negative for chills and fever.  HENT: Negative.   Respiratory: Positive for cough. Negative for shortness of breath.   Cardiovascular: Negative.  Negative for chest pain, palpitations and leg swelling.  Gastrointestinal: Negative.   Allergic/Immunologic: Negative.   Neurological: Negative.   Psychiatric/Behavioral: Negative.        Physical Exam  BP 140/74 (BP Location: Left Arm, Patient Position: Sitting, Cuff Size: Normal)   Pulse 89   Ht 4\' 10"  (1.473 m)   Wt 204 lb 6.4 oz (92.7 kg)   SpO2 98% Comment: on 2L of O2 pulse  BMI 42.72 kg/m   Wt Readings from Last 5 Encounters:  08/31/18 204 lb 6.4 oz (92.7 kg)  08/25/18 205 lb 0.4 oz (93 kg)  08/11/18 206 lb 3.2 oz (93.5 kg)  06/23/18 203 lb 12.8 oz (92.4 kg)  06/21/18 206 lb (93.4 kg)     Physical Exam  Constitutional: She is  oriented to person, place, and time. She appears well-developed and well-nourished. No distress.  Cardiovascular: Normal rate and regular rhythm.  Pulmonary/Chest: Effort normal and breath sounds normal. No respiratory distress. She has no wheezes.  Musculoskeletal: She exhibits no edema.  Neurological: She is alert and oriented to person, place, and time.  Psychiatric: She has a normal mood and affect.  Nursing note and vitals reviewed.     Imaging: Ct Angio Head W Or Wo Contrast  Result Date: 08/26/2018 CLINICAL DATA:  72 y/o  F; intracranial hemorrhage. EXAM: CT ANGIOGRAPHY HEAD AND NECK TECHNIQUE: Multidetector CT imaging of the head and neck was performed using the standard protocol during bolus administration of intravenous contrast. Multiplanar CT image reconstructions and MIPs were obtained to evaluate the vascular anatomy. Carotid stenosis measurements (when applicable) are obtained utilizing NASCET criteria, using the distal internal carotid diameter as the denominator. CONTRAST:  50mL ISOVUE-370 IOPAMIDOL (ISOVUE-370) INJECTION 76% COMPARISON:  08/25/2018 CT head. FINDINGS: CTA NECK FINDINGS Aortic arch: Standard branching. Imaged portion shows no evidence of aneurysm or dissection. No significant stenosis of the major arch vessel origins. Mild calcific aortic atherosclerosis. Right carotid system: No evidence of dissection, stenosis (50% or greater) or occlusion. Mild non stenotic calcified plaque of the carotid bifurcation. Left carotid system: No evidence of dissection, stenosis (50% or greater) or occlusion. Minimal non stenotic calcified plaque of the carotid bifurcation. Vertebral arteries: Codominant. No evidence of dissection, stenosis (50% or greater) or occlusion. Skeleton: Moderate spondylosis of the cervical spine with multilevel disc and facet degenerative changes  greatest at the C5-6 level. Other neck: Negative. Upper chest: Partially visualized healed median sternotomy. Review  of the MIP images confirms the above findings CTA HEAD FINDINGS Anterior circulation: No significant stenosis, proximal occlusion, aneurysm, or vascular malformation. Calcific atherosclerosis of carotid siphons. Mild left paraclinoid stenosis. Mild left M1 stenosis. Posterior circulation: No significant stenosis, proximal occlusion, aneurysm, or vascular malformation. Mild-to-moderate left P1 stenosis. Venous sinuses: As permitted by contrast timing, patent. Anatomic variants: None significant. Delayed phase: Stable 9 x 19 mm (AP by ML series 13, image 20) acute hemorrhage posterior to the right splenium of corpus callosum. No abnormal enhancement. Patent dural venous sinuses. Review of the MIP images confirms the above findings IMPRESSION: 1. Stable hemorrhage posterior to the right splenium of corpus callosum. No abnormal enhancement, vascular malformation, or dural venous sinus thrombosis identified. 2. Patent carotid and vertebral arteries. No dissection, aneurysm, or hemodynamically significant stenosis utilizing NASCET criteria. 3. Patent anterior and posterior intracranial circulation. No large vessel occlusion, aneurysm, or significant stenosis. 4. Calcific atherosclerosis of the aorta and carotid systems with mild left paraclinoid ICA stenosis. 5. Mild-to-moderate left P1 stenosis. Electronically Signed   By: Mitzi Hansen M.D.   On: 08/26/2018 14:08   Dg Chest 2 View  Result Date: 08/31/2018 CLINICAL DATA:  Cough, upper back pain EXAM: CHEST - 2 VIEW COMPARISON:  09/15/2017 FINDINGS: There is mild bilateral chronic interstitial thickening. There is no focal consolidation. There is no pleural effusion or pneumothorax. The heart and mediastinal contours are unremarkable. There is evidence of prior CABG. The osseous structures are unremarkable. IMPRESSION: No active cardiopulmonary disease. Electronically Signed   By: Elige Ko   On: 08/31/2018 11:35   Ct Head Wo Contrast  Result Date:  08/25/2018 CLINICAL DATA:  72 y/o  F; confusion. EXAM: CT HEAD WITHOUT CONTRAST TECHNIQUE: Contiguous axial images were obtained from the base of the skull through the vertex without intravenous contrast. COMPARISON:  04/22/2005 CT of the head. FINDINGS: Brain: Acute hemorrhage measuring 0.9 x 1.9 x 2.4 cm (volume = 2 cm^3) centered posterior to the right forceps of splenium of corpus callosum (series 3, image 18). Mild surrounding edema and local mass effect. No additional intracranial hemorrhage, stroke, or focal mass effect identified. Nonspecific white matter hypodensities are compatible with mild chronic microvascular ischemic changes for age and there is mild volume loss. Vascular: Calcific atherosclerosis of the carotid siphons. No hyperdense vessel identified. Skull: Normal. Negative for fracture or focal lesion. Sinuses/Orbits: No acute finding. Other: None. IMPRESSION: 1. Acute hemorrhage posterior to the right splenium of corpus callosum measuring up to 2.4 cm, 2 cc. Mild associated edema and mass effect. 2. No additional acute intracranial abnormality identified. 3. Mild chronic microvascular ischemic changes and volume loss of the brain for age. Critical Value/emergent results were called by telephone at the time of interpretation on 08/25/2018 at 6:55 pm to NP HOPE NEESE , who verbally acknowledged these results. Electronically Signed   By: Mitzi Hansen M.D.   On: 08/25/2018 19:00   Ct Angio Neck W Or Wo Contrast  Result Date: 08/26/2018 CLINICAL DATA:  72 y/o  F; intracranial hemorrhage. EXAM: CT ANGIOGRAPHY HEAD AND NECK TECHNIQUE: Multidetector CT imaging of the head and neck was performed using the standard protocol during bolus administration of intravenous contrast. Multiplanar CT image reconstructions and MIPs were obtained to evaluate the vascular anatomy. Carotid stenosis measurements (when applicable) are obtained utilizing NASCET criteria, using the distal internal carotid  diameter as the denominator. CONTRAST:  42mL ISOVUE-370 IOPAMIDOL (ISOVUE-370) INJECTION 76% COMPARISON:  08/25/2018 CT head. FINDINGS: CTA NECK FINDINGS Aortic arch: Standard branching. Imaged portion shows no evidence of aneurysm or dissection. No significant stenosis of the major arch vessel origins. Mild calcific aortic atherosclerosis. Right carotid system: No evidence of dissection, stenosis (50% or greater) or occlusion. Mild non stenotic calcified plaque of the carotid bifurcation. Left carotid system: No evidence of dissection, stenosis (50% or greater) or occlusion. Minimal non stenotic calcified plaque of the carotid bifurcation. Vertebral arteries: Codominant. No evidence of dissection, stenosis (50% or greater) or occlusion. Skeleton: Moderate spondylosis of the cervical spine with multilevel disc and facet degenerative changes greatest at the C5-6 level. Other neck: Negative. Upper chest: Partially visualized healed median sternotomy. Review of the MIP images confirms the above findings CTA HEAD FINDINGS Anterior circulation: No significant stenosis, proximal occlusion, aneurysm, or vascular malformation. Calcific atherosclerosis of carotid siphons. Mild left paraclinoid stenosis. Mild left M1 stenosis. Posterior circulation: No significant stenosis, proximal occlusion, aneurysm, or vascular malformation. Mild-to-moderate left P1 stenosis. Venous sinuses: As permitted by contrast timing, patent. Anatomic variants: None significant. Delayed phase: Stable 9 x 19 mm (AP by ML series 13, image 20) acute hemorrhage posterior to the right splenium of corpus callosum. No abnormal enhancement. Patent dural venous sinuses. Review of the MIP images confirms the above findings IMPRESSION: 1. Stable hemorrhage posterior to the right splenium of corpus callosum. No abnormal enhancement, vascular malformation, or dural venous sinus thrombosis identified. 2. Patent carotid and vertebral arteries. No dissection,  aneurysm, or hemodynamically significant stenosis utilizing NASCET criteria. 3. Patent anterior and posterior intracranial circulation. No large vessel occlusion, aneurysm, or significant stenosis. 4. Calcific atherosclerosis of the aorta and carotid systems with mild left paraclinoid ICA stenosis. 5. Mild-to-moderate left P1 stenosis. Electronically Signed   By: Mitzi Hansen M.D.   On: 08/26/2018 14:08   Mr Laqueta Jean And Wo Contrast  Result Date: 08/26/2018 CLINICAL DATA:  72 y/o  F; intracranial hemorrhage. EXAM: MRI HEAD WITHOUT AND WITH CONTRAST TECHNIQUE: Multiplanar, multiecho pulse sequences of the brain and surrounding structures were obtained without and with intravenous contrast. CONTRAST:  9 cc Gadavist COMPARISON:  08/25/2018 CT head.  08/26/2018 CTA head. FINDINGS: Brain: 9 x 19 mm mixed T1 and low T2 signal hemorrhage posterior to the right splenium of corpus callosum extending to the periatrial white matter compatible with late late acute/early subacute hemorrhage is stable (series 10, image 50). Stable small surrounding region of edema with local mass effect. No additional focus of susceptibility hypointensity to indicate intracranial hemorrhage. No reduced diffusion to suggest acute or early subacute infarction. Nonspecific T2 FLAIR hyperintensities in subcortical and periventricular white matter as well as the pons are compatible with mild chronic microvascular ischemic changes for age. Mild volume loss of the brain. No extra-axial collection, hydrocephalus, or herniation. After administration of intravenous contrast there is no abnormal enhancement of the brain. Vascular: Normal flow voids. Skull and upper cervical spine: Normal marrow signal. Sinuses/Orbits: Bilateral intra-ocular lens replacement. No significant abnormal signal of paranasal sinuses or mastoid air cells. Other: None. IMPRESSION: 1. Stable late acute/early subacute hemorrhage posterior to the right splenium of  corpus callosum. Stable small area of surrounding edema and local mass effect. 2. No new acute intracranial abnormality identified. No abnormal enhancement of the brain. 3. Mild for age chronic microvascular ischemic changes and volume loss of the brain. Electronically Signed   By: Mitzi Hansen M.D.   On: 08/26/2018 21:17   Dg Bone  Density  Result Date: 08/17/2018 EXAM: DUAL X-RAY ABSORPTIOMETRY (DXA) FOR BONE MINERAL DENSITY IMPRESSION: Referring Physician:  Donnetta Hail Your patient completed a BMD test using Lunar IDXA DXA system ( analysis version: 16 ) manufactured by [employer]. Technologist: AW PATIENT: Name: Tianah, Lonardo Patient ID: [id number] Birth Date: 01-Nov-1946 Height: 58.0 in. Sex: Female Measured: 08/17/2018 Weight: 206.2 lbs. Indications: Advanced Age, Albuterol, Caucasian, Celexa, Estrogen Deficient, Hypothyroid, Levothyroxine, Pantoprazole, Postmenopausal, Psoriatic arthritis Fractures: None Treatments: Calcium (E943.0), Vitamin D (E933.5) ASSESSMENT: The BMD measured at Femur Neck Left is 0.769 g/cm2 with a T-score of -1.9. This patient is considered osteopenic according to World Health Organization [facility]) criteria. Lumbar spine results are falsely elevated by diffuse degenerative changes. Site Region Measured Date Measured Age YA BMD Significant CHANGE T-score DualFemur Neck Left  08/17/2018    71.4         -1.9    0.769 g/cm2 AP Spine  L1-L4      08[age]17/2018    72.4         0.8     1.281 g/cm2 DualFemur Total Mean 08/17/2018    71.4         -1.7    0.796 g/cm2 World Health Organization [facility]) criteria for post-menopausal, Caucasian Women: Normal       T-score at or above -1 SD Osteopenia   T-score between -1 and -2.5 SD Osteoporosis T-score at or below -2.5 SD RECOMMENDATION: 1. All patients should optimize calcium and vitamin D intake. 2. Consider FDA approved medical therapies in postmenopausal women and men aged 68 years and older, based on the following: a. A hip or  vertebral (clinical or morphometric) fracture b. T- score < or = -2.5 at the femoral neck or spine after appropriate evaluation to exclude secondary causes c. Low bone mass (T-score between -1.0 and -2.5 at the femoral neck or spine) and a 10 year probability of a hip fracture > or = 3% or a 10 year probability of a major osteoporosis-related fracture > or = 20% based on the [place]-adapted WHO algorithm d. Clinician judgment and/or patient preferences may indicate treatment for people with 10-year fracture probabilities above or below these levels FOLLOW-UP: People with diagnosed cases of osteoporosis or at high risk for fracture should have regular bone mineral density tests. For patients eligible for Medicare, routine testing is allowed once every 2 years. The testing frequency can be increased to one year for patients who have rapidly progressing disease, those who are receiving or discontinuing medical therapy to restore bone mass, or have additional risk factors. FRAX* 10-year Probability of Fracture Based on femoral neck BMD: DualFemur (Left) Major Osteoporotic Fracture: 10.3% Hip Fracture:                1.9% Population:                  [place] (Caucasian) Risk Factors:                None *FRAX is a Armed forces logistics/support/administrative officer of the [employer] of [employer] for Metabolic Bone Disease, a World Science writer (WHO) [employer]. ASSESSMENT: The probability of a major osteoporotic fracture is 10.3 % within the next ten years. The probability of hip fracture is  1.9  % within the next 10 years. Electronically Signed   By: Myles Rosenthal M.D.   On: 08/17/2018 15:19     Assessment & Plan:   COPD III spirometry if use FEV1/VC p saba  Patient Instructions  Will check chest x ray and call with results Will give samples of mucinex Continue current medications Stay active Please practice deep breathing exercises Follow up with Dr. Sherene Sires as scheduled or sooner if needed       Chronic  respiratory failure with hypoxia and hypercapnia (HCC) Continue O2     Ivonne Andrew, NP 09/01/2018

## 2018-08-31 NOTE — Patient Instructions (Addendum)
Will check chest x ray and call with results Will give samples of mucinex Continue current medications Stay active Please practice deep breathing exercises Follow up with Dr. Sherene Sires as scheduled or sooner if needed

## 2018-09-01 ENCOUNTER — Encounter: Payer: Self-pay | Admitting: Nurse Practitioner

## 2018-09-01 NOTE — Assessment & Plan Note (Signed)
Continue O2 

## 2018-09-01 NOTE — Progress Notes (Signed)
Chart and office note reviewed in detail  > agree with a/p as outlined    

## 2018-09-01 NOTE — Assessment & Plan Note (Signed)
Patient Instructions  Will check chest x ray and call with results Will give samples of mucinex Continue current medications Stay active Please practice deep breathing exercises Follow up with Dr. Sherene Sires as scheduled or sooner if needed

## 2018-09-09 ENCOUNTER — Telehealth: Payer: Self-pay

## 2018-09-13 ENCOUNTER — Other Ambulatory Visit: Payer: Self-pay

## 2018-09-13 NOTE — Patient Outreach (Signed)
[facility] Care Management  09/13/2018  [name] [birth year] [id number]   EMMI- stroke RED ON EMMI ALERT Day # 13 Date: 09/12/18 Red Alert Reason:  [name] to follow up appointment? No  Outreach attempt: no answer.  HIPAA compliant voice message left.    Plan: RN CM will attempt patient again within 4 business days and send a letter.  [name], RN, MSN [facility] Care Management Care Management Coordinator Direct Line [phone] Toll Free: [phone]  Fax: [phone]

## 2018-09-13 NOTE — Telephone Encounter (Signed)
error 

## 2018-09-14 ENCOUNTER — Other Ambulatory Visit: Payer: Self-pay | Admitting: Internal Medicine

## 2018-09-14 ENCOUNTER — Other Ambulatory Visit: Payer: Self-pay | Admitting: Pulmonary Disease

## 2018-09-14 ENCOUNTER — Other Ambulatory Visit: Payer: Self-pay

## 2018-09-14 NOTE — Telephone Encounter (Signed)
Pt aware via vm to follow up with new PCP for further refills due to Dr.Kwiakowski's retiring. Rx will be refilled this time.

## 2018-09-14 NOTE — Patient Outreach (Signed)
[facility]) Care Management  09/14/2018  Nancy Arvin November 13, 1945 [id number]   EMMI- Stroke RED ON EMMI ALERT Day # 13 Date: 09/12/18 Red Alert Reason:  [name] to follow up appointment? No   Incoming call from patient.  She is able to verify HIPAA.  Discussed red alert.  Patient reports that she has not been to follow up but will see he PCP on Monday and Neurologist on 09/29/18.  Patient reports she is doing well and has no issues.  She has transportation to appointments.  Discussed stroke and when to seek medical attention.  She verbalized understanding and denies any needs.      Plan: RN CM will close case.    [name], RN, MSN [facility] Care Management Care Management Coordinator Direct Line [phone] Toll Free: [phone]  Fax: [phone]

## 2018-09-14 NOTE — Patient Outreach (Signed)
[facility]) Care Management  09/14/2018  [name] 12-13-1945 [id number]   EMMI- stroke RED ON EMMI ALERT Day # 13 Date:09/12/18 Red Alert Reason: [name] to follow up appointment? No  Outreach attempt: no answer.  HIPAA compliant voice message left.    Plan: RN CM will attempt patient again within 4 business days.  [name], RN, MSN [facility] Care Management Care Management Coordinator Direct Line [phone] Cell [phone] Toll Free: [phone]  Fax: [phone]

## 2018-09-17 ENCOUNTER — Ambulatory Visit: Payer: Self-pay

## 2018-09-21 ENCOUNTER — Telehealth: Payer: Self-pay

## 2018-09-21 NOTE — Telephone Encounter (Signed)
RN call [facility]. Rn stated pt was given one refill per the hospital notes. RN stated pt refills should be good till 10/27/2018. The pharmacist stated pt does have another refill for December 2019.  PT requested a 90 day refill. RN stated pt has an appt in with [place] on 09/29/2018 for a first time hospital follow up. RN stated Shanda Bumps NP will see her at that time. The pharmacist stated they will refill pts medications till 10/27/2018.Message sent to [facility] NP.

## 2018-09-23 ENCOUNTER — Telehealth: Payer: Self-pay

## 2018-09-23 NOTE — Telephone Encounter (Signed)
Call to Ms. Renwick. She is scheduled for AWV Dec 4th but is a transfer from Dr. Kirtland Bouchard. Call to leave a message that she needs to schedule NEW patient visit with dr. Ardyth Harps prior to [facility]   Montine Circle RN

## 2018-09-24 ENCOUNTER — Ambulatory Visit: Payer: Medicare Other | Admitting: Internal Medicine

## 2018-09-29 ENCOUNTER — Ambulatory Visit (INDEPENDENT_AMBULATORY_CARE_PROVIDER_SITE_OTHER): Payer: Medicare Other | Admitting: Adult Health

## 2018-09-29 ENCOUNTER — Encounter: Payer: Self-pay | Admitting: Adult Health

## 2018-09-29 VITALS — BP 146/82 | HR 84 | Ht <= 58 in | Wt 207.9 lb

## 2018-09-29 DIAGNOSIS — I10 Essential (primary) hypertension: Secondary | ICD-10-CM | POA: Diagnosis not present

## 2018-09-29 DIAGNOSIS — F332 Major depressive disorder, recurrent severe without psychotic features: Secondary | ICD-10-CM

## 2018-09-29 DIAGNOSIS — I610 Nontraumatic intracerebral hemorrhage in hemisphere, subcortical: Secondary | ICD-10-CM | POA: Diagnosis not present

## 2018-09-29 DIAGNOSIS — Z9981 Dependence on supplemental oxygen: Secondary | ICD-10-CM | POA: Diagnosis not present

## 2018-09-29 DIAGNOSIS — E785 Hyperlipidemia, unspecified: Secondary | ICD-10-CM

## 2018-09-29 DIAGNOSIS — R5383 Other fatigue: Secondary | ICD-10-CM

## 2018-09-29 NOTE — Progress Notes (Signed)
[facility] [address] [place]. [place] [address] [phone]       OFFICE FOLLOW UP NOTE  [name] Date of Birth:  1946/05/03 Medical Record Number:  [id number]   Reason for Referral:  hospital stroke follow up  CHIEF COMPLAINT:  Chief Complaint  Patient presents with  . Hospitalization Follow-up    Hospital follow up. Daughter present. Rm 9. Patient has brougtht a record of her blood pressure readings. No concerns at this time.     HPI: [name] is being seen today for initial visit in the office for right CC splenium small ICH with unclear etiology on 08/25/2018. History obtained from patient, daughter and chart review. Reviewed all radiology images and labs personally.  Amber Edwards Collinsis a 72 y.o.femalewith history of CHF, bradycardia, COPD, HTN, HLD, obesity, NSTEMI, CAD s/p CABG, RA, PFadmitted for confusion.  CT head reviewed and showed acute hemorrhage posterior to the right splenium of corpus callosum measuring up to 2.4 cm with mild associated edema and mass-effect.  No tPA given due toICH.MRI w/wo contrast again showed right CC splenium ICH without evidence of infarct or tumor.  CTA head and neck was negative for AVM or aneurysm.  Repeat CT head showed stable ICH.  2D echo showed an EF of 65 to 70%.  LDL 52 and A1c 5.3.  Patient was on aspirin 81 mg UTA and recommended no antithrombotic at this time with repeat CTA in 2 to 3 weeks time and consider resuming aspirin if blood absorbed.  Patient was initially found to be in hypertensive emergency which stabilized throughout admission with use of Cleviprex and resumed home blood pressure medications with long-term BP goal normotensive range.  As LDL less than 70, recommended to decrease atorvastatin from 80 mg to 40 mg.  As no specific cause of bleeding was found, it was felt as though it was likely related to increased BP therefore at home dose of losartan was  increased.  Patient is being seen today for hospital follow up and accompanied by daughter. She does state at times it takes her to remember certain things or takes her longer to remember. She does endorse "senior moments" periodically even prior to her ICH. Denies headaches. She feels as though she doesn't sleep well where she has to take melatonin at night but does feel consistent fatigue during the night. She does endorse depression that has been present since her husband passed away a few years ago. She is on Celexa which she has been on for "years".She does endorse worsening depression and decreased energy for the past 6 months.  She is considering to schedule appointment with psychology for management.  She is unsure if she has ever had sleep apnea testing. She does state that she wears oxygen 24/7 2L [place] unless exerting and then will increase to 3L. She believes overall she is doing well from a stroke standpoint. Blood pressure 146/82. She does monitor at home and typically SBP 120-140.  No further concerns at this time.  Denies new or worsening stroke/TIA symptoms.   ROS:   14 system review of systems performed and negative with exception of fatigue, blurred vision, shortness of breath, wheezing, easy bruising, feeling hot, joint pain, aching muscles, runny nose, weakness, depression, anxiety, too much sleep, decreased energy, insomnia and snoring  PMH:  Past Medical History:  Diagnosis Date  . Acute bronchitis 09/25/2015  . ACUTE ON CHRONIC DIASTOLIC HEART FAILURE 12/04/2010   Qualifier: Diagnosis of  ByLadona Ridgel, MD, FACC, Vergia Alcon   . Arthritis    OA RIGHT KNEE WITH PAIN  . Barrett esophagus   . Bradycardia 06/01/2015  . Chronic respiratory failure (HCC)   . Chronic respiratory failure with hypoxia and hypercapnia (HCC) 02/04/2010   Followed in Pulmonary clinic/ [facility]       - 02 dependent  since 07/02/10 >>  83% RA December 05, 2010       - ONO RA 08/05/12  :  Positive  sat < 89 x 2:58m> repeat on 2lpm rec 08/12/2012  - 06/17/2013 reported desat with activity p Knee surgery > rec restart 2lpm with activity  - 06/27/2013   Walked 2lpm  x one lap @ 185 stopped due to sat 88% not sob , desat to 82% on RA just at th  . COPD (chronic obstructive pulmonary disease) (HCC)   . COPD III spirometry if use FEV1/VC p saba  07/18/2010   Quit smoking May 2006       - PFT's  04/12/10 FEV1  1.21 (69%) ratio 77 and no change p B2,  DLC0 56%   VC 70%         - PFTs  08/08/2013 FEV1 1.21 (60%) ratio 86 and no change p B2 DLCO 79%  VC 72%  On symbicort 160 2bid  - PFT's  02/08/2018  FEV1 0.70 (40 % ) ratio 56 if use FEV1/VC  p 38 % improvement from saba p symb 160 prior to study with DLCO  78 % corrects to 147  % for alv volume   - 02/08/2018  . Cough variant asthma 02/26/2011   Followed in Pulmonary clinic/ [facility]  - PFT's  06/04/15  FEV1 1.20 (67 % ) ratio 83  p 6 % improvement from saba with DLCO  80 % corrects to 132 % for alv volume      - Clinical dx based on response to symbicort       FENO 09/16/2016  =   96 on symbicort 160 2bid > added singulair  Allergy profile 09/16/2016 >  Eos 0.5 /  IgE  78 neg RAST  -  Referred to rehab 04/29/2017 > completed  . DOE (dyspnea on exertion) 02/19/2016  . Essential hypertension 04/20/2007   Qualifier: Diagnosis of  By: Marcelyn Ditty RN, Katy Fitch   . GERD (gastroesophageal reflux disease)   . History of ARDS 2006  . History of home oxygen therapy    AT NIGHT WHEN SLEEPING 2 L / MIN NASAL CANNULA  . Hyperlipidemia 07/12/2015  . Hypertension   . Hypothyroidism   . Morbid (severe) obesity due to excess calories (HCC) 04/22/2015   pfts with erv 14% 06/04/15  And 33% 02/08/2018   . NSTEMI (non-ST elevated myocardial infarction) (HCC) 05/31/2015  . Pneumococcal pneumonia (HCC) 2006   HOSPITALIZED AND DEVELOPED ARDS  . Psoriatic arthritis (HCC)   . Pulmonary fibrosis (HCC)   . PULMONARY FIBROSIS ILD POST INFLAMMATORY CHRONIC 07/18/2010   Followed as  Primary Care Patient/ [facility]  -s/p ARDS 2006 with bacteremic S  Pna       - CT chest 07/03/10 Nonspecific PF mostly upper lobes       - CT chest 12/03/10 acute gg changes and effusions c/w chf - PFT's  02/08/2018  FVC 0.64 (28 %)   with DLCO  78 % corrects to 147 % for alv volume     . Rheumatic disease   .  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 SOB (shortness of breath) on exertion     PSH:  Past Surgical History:  Procedure Laterality Date  . ABDOMINOPLASTY    . CARDIAC CATHETERIZATION N/A 06/01/2015   Procedure: Left Heart Cath and Coronary Angiography;  Surgeon: Runell Gess, MD;  Location[facility] INVASIVE CV LAB;  Service: Cardiovascular;  Laterality: N/A;  . CARPAL TUNNEL RELEASE    . CHOLECYSTECTOMY    . CORONARY ARTERY BYPASS GRAFT N/A 06/04/2015   Procedure: CORONARY ARTERY BYPASS GRAFT times three            with left internal mammary artery and right leg saphenous vein;  Surgeon: Alleen Borne, MD;  Location: [facility] OR;  Service: Open Heart Surgery;  Laterality: N/A;  . cosmetic breast surgery    . JOINT REPLACEMENT    . KNEE ARTHROSCOPY Left   . TEE WITHOUT CARDIOVERSION  06/04/2015   Procedure: TRANSESOPHAGEAL ECHOCARDIOGRAM (TEE);  Surgeon: Alleen Borne, MD;  Location: [facility] OR;  Service: Open Heart Surgery;;  . TOTAL KNEE ARTHROPLASTY Left   . TOTAL KNEE ARTHROPLASTY Right 06/06/2013   Procedure: RIGHT TOTAL KNEE ARTHROPLASTY;  Surgeon: Loanne Drilling, MD;  Location: [facility] ORS;  Service: Orthopedics;  Laterality: Right;    Social History:  Social History   Socioeconomic History  . Marital status: Married    Spouse name: Not on file  . Number of children: Not on file  . Years of education: Not on file  . Highest education level: Not on file  Occupational History  . 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 Financial resource strain: Not on file  . Food insecurity:    Worry: Not on file    Inability: Not on file  . Transportation needs:     Medical: Not on file    Non-medical: Not on file  Tobacco Use  . Smoking status: Former Smoker    Packs/day: 1.50    Years: 58.50    Pack years: 87.75    Types: Cigarettes    Last attempt to quit: 03/03/2005    Years since quitting: 13.5  . Smokeless tobacco: Never Used  Substance and Sexual Activity  . Alcohol use: No  . Drug use: No  . Sexual activity: Not on file  Lifestyle  . Physical activity:    Days per week: Not on file    Minutes per session: Not on file  . Stress: Not on file  Relationships  . Social connections:    Talks on phone: Not on file    Gets together: Not on file    Attends religious service: Not on file    Active member of club or organization: Not on file    Attends meetings of clubs or organizations: Not on file    Relationship status: Not on file  . Intimate partner violence:    Fear of current or ex partner: Not on file    Emotionally abused: Not on file    Physically abused: Not on file    Forced sexual activity: Not on file  Other Topics Concern  . Not on file  Social History Narrative  . Not on file    Family History:  Family History  Problem Relation Age of Onset  . Breast cancer Mother   . Coronary artery disease Father   . Rheum arthritis Father     Medications:   Current Outpatient Medications on File Prior  to Visit  Medication Sig Dispense Refill  . acetaminophen (TYLENOL ARTHRITIS PAIN) 650 MG CR tablet Take 1,300 mg by mouth every 8 (eight) hours as needed for pain. Per bottle directions as needed    . albuterol (PROAIR HFA) 108 (90 Base) MCG/ACT inhaler 2 puffs every 4 hours as needed only  if your can't catch your breath 1 Inhaler 11  . atorvastatin (LIPITOR) 40 MG tablet Take 1 tablet (40 mg total) by mouth daily at 6 PM. 30 tablet 1  . Cholecalciferol (VITAMIN D3) 2000 UNITS TABS Take 2,000 Int'l Units by mouth daily.    . citalopram (CELEXA) 40 MG tablet TAKE 1 TABLET BY MOUTH  EVERY MORNING 90 tablet 0  . doxycycline  (VIBRA-TABS) 100 MG tablet Take 1 tablet (100 mg total) by mouth 2 (two) times daily. 14 tablet 0  . furosemide (LASIX) 20 MG tablet TAKE 1 TABLET BY MOUTH TWO  TIMES DAILY (Patient taking differently: Take 20 mg by mouth daily. ) 180 tablet 2  . Golimumab (SIMPONI ARIA IV) Inject into the vein every 8 (eight) weeks.    Marland Kitchen levothyroxine (SYNTHROID, LEVOTHROID) 150 MCG tablet TAKE 1 TABLET BY MOUTH  DAILY BEFORE BREAKFAST 90 tablet 2  . losartan (COZAAR) 50 MG tablet Take 1 tablet (50 mg total) by mouth daily. 30 tablet 1  . montelukast (SINGULAIR) 10 MG tablet TAKE 1 TABLET BY MOUTH AT  BEDTIME 90 tablet 3  . NON FORMULARY 2lpm 24/7    . OXYGEN Inhale into the lungs. continuous o2    . pantoprazole (PROTONIX) 40 MG tablet Take 1 tablet (40 mg total) by mouth daily before breakfast. 90 tablet 1  . SYMBICORT 160-4.5 MCG/ACT inhaler USE 2 PUFFS TWO TIMES DAILY (Patient taking differently: Inhale 2 puffs into the lungs 2 (two) times daily. ) 30.6 g 1  . traMADol (ULTRAM) 50 MG tablet TAKE 1-2 TABLETS EVERY 6 HOURS AS NEEDED FOR PAIN 90 tablet 0  . triamcinolone cream (KENALOG) 0.1 % Apply 1 application topically 2 (two) times daily as needed (for psorisis).     No current facility-administered medications on file prior to visit.     Allergies:  No Known Allergies   Physical Exam  Vitals:   09/29/18 1524  BP: (!) 146/82  Pulse: 84  Weight: 207 lb 14.4 oz (94.3 kg)  Height: 4\' 10"  (1.473 m)   Body mass index is 43.45 kg/m. No exam data present  General: Obese pleasant elderly Caucasian female, seated, in no evident distress Head: head normocephalic and atraumatic.   Neck: supple with no carotid or supraclavicular bruits Cardiovascular: regular rate and rhythm, no murmurs Musculoskeletal: no deformity Skin:  no rash/petichiae Vascular:  Normal pulses all extremities  Neurologic Exam Mental Status: Awake and fully alert. Oriented to place and time. Recent and remote memory intact.  Attention span, concentration and fund of knowledge appropriate. Mood and affect appropriate.  Cranial Nerves: Fundoscopic exam reveals sharp disc margins. Pupils equal, briskly reactive to light. Extraocular movements full without nystagmus. Visual fields full to confrontation. Hearing intact. Facial sensation intact. Face, tongue, palate moves normally and symmetrically.  Motor: Normal bulk and tone. Normal strength in all tested extremity muscles. Sensory.: intact to touch , pinprick , position and vibratory sensation.  Coordination: Rapid alternating movements normal in all extremities. Finger-to-nose and heel-to-shin performed accurately bilaterally. Gait and Station: Arises from chair without difficulty. Stance is normal. Gait demonstrates normal stride length and balance.  Reflexes: 1+ and symmetric. Toes downgoing.  NIHSS  0 Modified Rankin  0    Diagnostic Data (Labs, Imaging, Testing)  CT HEAD WO CONTRAST 08/25/2018 IMPRESSION: 1. Acute hemorrhage posterior to the right splenium of corpus callosum measuring up to 2.4 cm, 2 cc. Mild associated edema and mass effect. 2. No additional acute intracranial abnormality identified. 3. Mild chronic microvascular ischemic changes and volume loss of the brain for age.  CT ANGIO HEAD W OR WO CONTRAST CT ANGIO NECK W OR WO CONTRAST 08/26/2018 IMPRESSION: 1. Stable hemorrhage posterior to the right splenium of corpus callosum. No abnormal enhancement, vascular malformation, or dural venous sinus thrombosis identified. 2. Patent carotid and vertebral arteries. No dissection, aneurysm, or hemodynamically significant stenosis utilizing NASCET criteria. 3. Patent anterior and posterior intracranial circulation. No large vessel occlusion, aneurysm, or significant stenosis. 4. Calcific atherosclerosis of the aorta and carotid systems with mild left paraclinoid ICA stenosis. 5. Mild-to-moderate left P1 stenosis.  MR BRAIN WO  CONTRAST 08/26/2018 IMPRESSION: 1. Stable late acute/early subacute hemorrhage posterior to the right splenium of corpus callosum. Stable small area of surrounding edema and local mass effect. 2. No new acute intracranial abnormality identified. No abnormal enhancement of the brain. 3. Mild for age chronic microvascular ischemic changes and volume loss of the brain.  ECHOCARDIOGRAM 08/26/2018 Study Conclusions - Left ventricle: The cavity size was normal. There was mild   concentric hypertrophy. Systolic function was vigorous. The   estimated ejection fraction was in the range of 65% to 70%. Wall   motion was normal; there were no regional wall motion   abnormalities. Doppler parameters are consistent with abnormal   left ventricular relaxation (grade 1 diastolic dysfunction). - Mitral valve: Calcified annulus. Valve area by pressure   half-time: 2.18 cm^2. - Left atrium: The atrium was mildly dilated.   ASSESSMENT: Jesiah Yerby is a 72 y.o. year old female here with right CC splenium ICH on 08/25/2018 secondary to unclear etiology but possibly due to hypertension. Vascular risk factors include CHF, bradycardia, COPD, HTN, HLD, obesity, NSTEMI, CAD status post CABG and PF.     PLAN:  1. ICH : Continue atorvastatin 40 mg for secondary stroke prevention. Maintain strict control of hypertension with blood pressure goal below 130/90, diabetes with hemoglobin A1c goal below 6.5% and cholesterol with LDL cholesterol (bad cholesterol) goal below 70 mg/dL.  I also advised the patient to eat a healthy diet with plenty of whole grains, cereals, fruits and vegetables, exercise regularly with at least 30 minutes of continuous activity daily and maintain ideal body weight.  Order placed to repeat CT head and if shows resolution of prior bleed, will consider restarting aspirin at that time for secondary stroke prevention 2. HTN: Advised to continue current treatment regimen.  Today's BP  146/82.  Advised to continue to monitor at home along with continued follow-up with PCP for management 3. HLD: Advised to continue current treatment regimen along with continued follow-up with PCP for future prescribing and monitoring of lipid panel 4. Daytime fatigue/insomnia: Advised patient that this could be multifactorial as she continues to experience depression that has worsened over the past 6 months.  Also recommended to follow-up with pulmonologist regarding possible need for sleep apnea testing.  As she has not established care at this time with PCP, referral [facility] to [facility] in order for better management of depression.  Reviewed good sleep hygiene habits    Follow up in 3 months or call earlier if needed   Greater than 50% of  time during this 25 minute visit was spent on counseling, explanation of diagnosis of ICH, reviewing risk factor management of HTN, HLD, CHF, COPD and depression, planning of further management along with potential future management, and discussion with patient and family answering all questions.    George Hugh, AGNP-BC  [facility] [address] [place], [place] [address]  Phone [phone] Fax [phone] Note: This document was prepared with digital dictation and possible smart phrase technology. Any transcriptional errors that result from this process are unintentional.

## 2018-09-29 NOTE — Patient Instructions (Addendum)
Continue lipitor 40mg   for secondary stroke prevention  Speak with your pulmonologist regarding possible sleep study to rule out sleep apnea  Continue to follow up with PCP regarding cholesterol and blood pressure management   You will be called to schedule CT scan to assess for resolution of bleed - if resolved, we possibly will restart on aspirin  Continue to stay active and maintain a healthy diet  Continue to monitor blood pressure at home  Maintain strict control of hypertension with blood pressure goal below 130/90, diabetes with hemoglobin A1c goal below 6.5% and cholesterol with LDL cholesterol (bad cholesterol) goal below 70 mg/dL. I also advised the patient to eat a healthy diet with plenty of whole grains, cereals, fruits and vegetables, exercise regularly and maintain ideal body weight.  Followup in the future with me in 3 months or call earlier if needed       Thank you for coming to see at [facility]. I hope we have been able to provide you high quality care today.  You may receive a patient satisfaction survey over the next few weeks. We would appreciate your feedback and comments so that we may continue to improve ourselves and the health of our patients.

## 2018-09-29 NOTE — Telephone Encounter (Signed)
Call ms. Eliasen and left another message Call placed to ms. [place], her dtr   Cala Bradford stated that her mother was told by insurance to get "wellness" and she is not sure what that is.  Explained to [place] the difference in "Wellness" vs her MD apt.  Explained that Dr. Ardyth Harps has not seen her and she will need to make an apt to see her as a 'new patient".   She does not mind bringing her for a wellness exam, but verbalized understanding that I could not evaluate and treat medical issues. She will discuss w her mother, who is seeing neurology today and will have her call and make a fup apt with Dr Ardyth Harps.  Montine Circle RN

## 2018-09-30 NOTE — Progress Notes (Signed)
I agree with the above plan 

## 2018-10-04 ENCOUNTER — Telehealth: Payer: Self-pay | Admitting: Adult Health

## 2018-10-04 NOTE — Telephone Encounter (Signed)
Medicare/[place] of [place] order sent to GI. No auth they will reach out to the pt to schedule.  °

## 2018-10-05 NOTE — Progress Notes (Signed)
Subjective:   [name] is a 72 y.o. female who presents for Medicare Annual (Subsequent) preventive examination.  Reports health as fair 10/23 ER admit  Has not seen Dr. Ardyth Huff; called the patient's dtr to explain this was not a medical visit and would recommend she see Dr. Ardyth Huff. The dtr stated she would schedule. Goes By [name]   Has new grand baby just born over Thanksgiving Is tearful today and cries over her weight gain and would like to lose 80 lbs Admits she has not seen a counselor but has been advised too. Mentioned she feels "displaced" and does not feel she has purpose which started when her spouse died x 78 yo.    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  Hx of depression since the passing of spouse and to see psychologist - referral by neurologist to [facility] since she has not established with a pcp    No functional loss - able to care of self  C/o of h/a on occasion and this just started happening Fup CT being scheduled  Advised report any changes to neurology   Diet Chol/hdl 2.3 Buys her own groceries and cooks  Groceries are brought to her    Exercise Son feels she needs in home rehab  Does not leave her home  Her knees hurt and doesn't feel good Feels she is to heavy and is "ashamed" of this    Health Maintenance Due  Topic Date Due  . Hepatitis C Screening  11-26-1945  . COLONOSCOPY  03/05/1997   Defer hep C - hold due to other issues today)   Mammogram 02/2018  dexa 08/2018  -1.9- understands she has bone loss and is taking Vit D  Colonoscopy none noted; Reports the MD did not  do due to oxygen therapy so it is on hold for now Has had 2 in the past that were normal (last one 2014 )   Safety  Has a tub chair  Stating a shower makes her dizzy and her dtr is always there when she takes a shower.   One level home, does not use the bottom level         Objective:      Vitals: There were no vitals taken for this visit.  There is no height or weight on file to calculate BMI.  Advanced Directives 08/25/2018 08/25/2018 05/11/2017 05/31/2015 06/06/2013 06/06/2013 05/31/2013  Does Patient Have a Medical Advance Directive? Yes No Yes Yes Patient has advance directive, copy not in chart - Patient has advance directive, copy not in chart  Type of Advance Directive Living will;Healthcare Power of Attorney - Living will;Healthcare Power of Attorney Living will Healthcare Power of [place];Living will - Healthcare Power of [place];Living will  Does patient want to make changes to medical advance directive? No - Patient declined - - No - Patient declined - - -  Copy of Healthcare Power of Attorney in Chart? - - No - copy requested No - copy requested Copy requested from family - Copy requested from family  Would patient like information on creating a medical advance directive? - No - Patient declined - - - - -  Pre-existing out of facility DNR order (yellow form or pink MOST form) - - - - No No -    Tobacco Social History   Tobacco Use  Smoking Status Former Smoker  . Packs/day: 1.50  . Years: 58.50  . Pack years: 87.75  . Types:  Cigarettes  . Last attempt to quit: 03/03/2005  . Years since quitting: 13.6  Smokeless Tobacco Never Used     Counseling given: Not Answered   Clinical Intake:   Past Medical History:  Diagnosis Date  . Acute bronchitis 09/25/2015  . ACUTE ON CHRONIC DIASTOLIC HEART FAILURE 12/04/2010   Qualifier: Diagnosis of  By: Ladona Ridgel, MD, [facility], Vergia Alcon   . Arthritis    OA RIGHT KNEE WITH PAIN  . Barrett esophagus   . Bradycardia 06/01/2015  . Chronic respiratory failure (HCC)   . Chronic respiratory failure with hypoxia and hypercapnia (HCC) 02/04/2010   Followed in Pulmonary clinic/ [facility]/ [facility]       - 02 dependent  since 07/02/10 >>  83% RA December 05, 2010       - ONO RA 08/05/12  :  Positive sat < 89 x 2:60m> repeat on 2lpm  rec 08/12/2012  - 06/17/2013 reported desat with activity p Knee surgery > rec restart 2lpm with activity  - 06/27/2013   Walked 2lpm  x one lap @ 185 stopped due to sat 88% not sob , desat to 82% on RA just at th  . COPD (chronic obstructive pulmonary disease) (HCC)   . COPD III spirometry if use FEV1/VC p saba  07/18/2010   Quit smoking May 2006       - PFT's  04/12/10 FEV1  1.21 (69%) ratio 77 and no change p B2,  DLC0 56%   VC 70%         - PFTs  08/08/2013 FEV1 1.21 (60%) ratio 86 and no change p B2 DLCO 79%  VC 72%  On symbicort 160 2bid  - PFT's  02/08/2018  FEV1 0.70 (40 % ) ratio 56 if use FEV1/VC  p 38 % improvement from saba p symb 160 prior to study with DLCO  78 % corrects to 147  % for alv volume   - 02/08/2018  . Cough variant asthma 02/26/2011   Followed in Pulmonary clinic/ [facility]/ [facility]  - PFT's  06/04/15  FEV1 1.20 (67 % ) ratio 83  p 6 % improvement from saba with DLCO  80 % corrects to 132 % for alv volume      - Clinical dx based on response to symbicort       FENO 09/16/2016  =   96 on symbicort 160 2bid > added singulair  Allergy profile 09/16/2016 >  Eos 0.5 /  IgE  78 neg RAST  -  Referred to rehab 04/29/2017 > completed  . DOE (dyspnea on exertion) 02/19/2016  . Essential hypertension 04/20/2007   Qualifier: Diagnosis of  By: Marcelyn Ditty RN, Katy Fitch   . GERD (gastroesophageal reflux disease)   . History of ARDS 2006  . History of home oxygen therapy    AT NIGHT WHEN SLEEPING 2 L / MIN NASAL CANNULA  . Hyperlipidemia 07/12/2015  . Hypertension   . Hypothyroidism   . Morbid (severe) obesity due to excess calories (HCC) 04/22/2015   pfts with erv 14% 06/04/15  And 33% 02/08/2018   . NSTEMI (non-ST elevated myocardial infarction) (HCC) 05/31/2015  . Pneumococcal pneumonia (HCC) 2006   HOSPITALIZED AND DEVELOPED ARDS  . Psoriatic arthritis (HCC)   . Pulmonary fibrosis (HCC)   . PULMONARY FIBROSIS ILD POST INFLAMMATORY CHRONIC 07/18/2010   Followed as Primary Care Patient/ [facility]/ [facility]  -s/p ARDS 2006 with bacteremic S  Pna       -  CT chest 07/03/10 Nonspecific PF mostly upper lobes       - CT chest 12/03/10 acute gg changes and effusions c/w chf - PFT's  02/08/2018  FVC 0.64 (28 %)   with DLCO  78 % corrects to 147 % for alv volume     . Rheumatic disease   . 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 SOB (shortness of breath) on exertion    Past Surgical History:  Procedure Laterality Date  . ABDOMINOPLASTY    . CARDIAC CATHETERIZATION N/A 06/01/2015   Procedure: Left Heart Cath and Coronary Angiography;  Surgeon: Runell Gess, MD;  Location: [facility] INVASIVE CV LAB;  Service: Cardiovascular;  Laterality: N/A;  . CARPAL TUNNEL RELEASE    . CHOLECYSTECTOMY    . CORONARY ARTERY BYPASS GRAFT N/A 06/04/2015   Procedure: CORONARY ARTERY BYPASS GRAFT times three            with left internal mammary artery and right leg saphenous vein;  Surgeon: Alleen Borne, MD;  Location: [facility] OR;  Service: Open Heart Surgery;  Laterality: N/A;  . cosmetic breast surgery    . JOINT REPLACEMENT    . KNEE ARTHROSCOPY Left   . TEE WITHOUT CARDIOVERSION  06/04/2015   Procedure: TRANSESOPHAGEAL ECHOCARDIOGRAM (TEE);  Surgeon: Alleen Borne, MD;  Location[facility];  Service: Open Heart Surgery;;  . TOTAL KNEE ARTHROPLASTY Left   . TOTAL KNEE ARTHROPLASTY Right 06/06/2013   Procedure: RIGHT TOTAL KNEE ARTHROPLASTY;  Surgeon: Loanne Drilling, MD;  Location: [facility] ORS;  Service: Orthopedics;  Laterality: Right;   Family History  Problem Relation Age of Onset  . Breast cancer Mother   . Coronary artery disease Father   . Rheum arthritis Father    Social History   Socioeconomic History  . Marital status: Married    Spouse name: Not on file  . Number of children: Not on file  . Years of education: Not on file  . Highest education level: Not on file  Occupational History  . 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 Financial resource strain: Not on file  . Food insecurity:     Worry: Not on file    Inability: Not on file  . Transportation needs:    Medical: Not on file    Non-medical: Not on file  Tobacco Use  . Smoking status: Former Smoker    Packs/day: 1.50    Years: 58.50    Pack years: 87.75    Types: Cigarettes    Last attempt to quit: 03/03/2005    Years since quitting: 13.6  . Smokeless tobacco: Never Used  Substance and Sexual Activity  . Alcohol use: No  . Drug use: No  . Sexual activity: Not on file  Lifestyle  . Physical activity:    Days per week: Not on file    Minutes per session: Not on file  . Stress: Not on file  Relationships  . Social connections:    Talks on phone: Not on file    Gets together: Not on file    Attends religious service: Not on file    Active member of club or organization: Not on file    Attends meetings of clubs or organizations: Not on file    Relationship status: Not on file  Other Topics Concern  . Not on file  Social History Narrative  . Not on file    Outpatient Encounter Medications as  of 10/06/2018  Medication Sig  . acetaminophen (TYLENOL ARTHRITIS PAIN) 650 MG CR tablet Take 1,300 mg by mouth every 8 (eight) hours as needed for pain. Per bottle directions as needed  . albuterol (PROAIR HFA) 108 (90 Base) MCG/ACT inhaler 2 puffs every 4 hours as needed only  if your can't catch your breath  . atorvastatin (LIPITOR) 40 MG tablet Take 1 tablet (40 mg total) by mouth daily at 6 PM.  . Cholecalciferol (VITAMIN D3) 2000 UNITS TABS Take 2,000 Int'l Units by mouth daily.  . citalopram (CELEXA) 40 MG tablet TAKE 1 TABLET BY MOUTH  EVERY MORNING  . doxycycline (VIBRA-TABS) 100 MG tablet Take 1 tablet (100 mg total) by mouth 2 (two) times daily.  . furosemide (LASIX) 20 MG tablet TAKE 1 TABLET BY MOUTH TWO  TIMES DAILY (Patient taking differently: Take 20 mg by mouth daily. )  . Golimumab (SIMPONI ARIA IV) Inject into the vein every 8 (eight) weeks.  Marland Kitchen levothyroxine (SYNTHROID, LEVOTHROID) 150 MCG tablet  TAKE 1 TABLET BY MOUTH  DAILY BEFORE BREAKFAST  . losartan (COZAAR) 50 MG tablet Take 1 tablet (50 mg total) by mouth daily.  . montelukast (SINGULAIR) 10 MG tablet TAKE 1 TABLET BY MOUTH AT  BEDTIME  . NON FORMULARY 2lpm 24/7  . OXYGEN Inhale into the lungs. continuous o2  . pantoprazole (PROTONIX) 40 MG tablet Take 1 tablet (40 mg total) by mouth daily before breakfast.  . SYMBICORT 160-4.5 MCG/ACT inhaler USE 2 PUFFS TWO TIMES DAILY (Patient taking differently: Inhale 2 puffs into the lungs 2 (two) times daily. )  . traMADol (ULTRAM) 50 MG tablet TAKE 1-2 TABLETS EVERY 6 HOURS AS NEEDED FOR PAIN  . triamcinolone cream (KENALOG) 0.1 % Apply 1 application topically 2 (two) times daily as needed (for psorisis).   No facility-administered encounter medications on file as of 10/06/2018.     Activities of Daily Living In your present state of health, do you have any difficulty performing the following activities: 08/25/2018  Hearing? N  Vision? N  Difficulty concentrating or making decisions? N  Walking or climbing stairs? N  Dressing or bathing? N  Doing errands, shopping? N  Some recent data might be hidden    Patient Care Team: [name], [name], MD as PCP - General (Internal Medicine) Lars Masson, MD as PCP - Cardiology (Cardiology)    Assessment:   This is a routine wellness examination for [place].  Exercise Activities and Dietary recommendations    Goals   None     Fall Risk Fall Risk  05/11/2017 06/20/2016 04/12/2014  Falls in the past year? No No No     Depression Screen PHQ 2/9 Scores 05/11/2017 06/20/2016 04/12/2014  PHQ - 2 Score 5 0 0  PHQ- 9 Score 14 - -     Cognitive Function     Ad8 score reviewed for issues:  Issues making decisions:  Less interest in hobbies / activities:  Repeats questions, stories (family complaining):  Trouble using ordinary gadgets (microwave, computer, phone):  Forgets the month or year:   Mismanaging finances:    Remembering appts:  Daily problems with thinking and/or memory: Ad8 score is=        Immunization History  Administered Date(s) Administered  . Influenza Split 08/04/2011, 09/01/2012, 07/05/2015  . Influenza Whole 09/21/2007, 07/23/2010, 10/03/2016  . Influenza, High Dose Seasonal PF 07/27/2015, 12/24/2017, 08/09/2018  . Influenza-Unspecified 10/03/2013, 09/03/2014  . Pneumococcal Conjugate-13 04/12/2014  . Pneumococcal Polysaccharide-23 07/27/2015  .  Pneumococcal-Unspecified 11/03/2004  . Tdap 08/12/2011    Screening Tests Health Maintenance  Topic Date Due  . Hepatitis C Screening  1946-07-02  . COLONOSCOPY  03/05/1997  . MAMMOGRAM  02/27/2019  . TETANUS/TDAP  08/11/2021  . INFLUENZA VACCINE  Completed  . DEXA SCAN  Completed  . PNA vac Low Risk Adult  Completed        Plan:      PCP Notes   Health Maintenance Defer hep C - hold due to other issues today)   Mammogram 02/2018  dexa 08/2018  -1.9- understands she has bone loss and is taking Vit D  Colonoscopy none noted; Reports the MD did not  do due to oxygen therapy so it is on hold for now Has had 2 in the past that were normal (last one 2014 )    Abnormal Screens  States she is still grieving her spouse; becomes teary. States she is depressed but denies SI. She also was getting out of the home with a neighbor who had a change in her schedule as well as lost a friend at 66 [facility]. Also has negative self esteem and remarks that she feels very bad about her weight gain.  Memory intact; no functional losses since stroke.   Referrals  none  Patient concerns; C/o of h/a starting a few days ago. States she feels pain in eye that goes back to head. Did not discuss with Neuro because she didn't have h/a then. This comes and goes and is described as transient; Noted Today on her way here but went away.  Advised to go to the ER if it persisted or noted more pain. Also  advised to call the neurologist tomorrow and let him know.  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 Ardyth Huff when leaving today (new patient)  Agrees to call [facility] and schedule with Misty Stanley at [place].        I have personally reviewed and noted the following in the patient's chart:   . Medical and social history . Use of alcohol, tobacco or illicit drugs  . Current medications and supplements . Functional ability and status . Nutritional status . Physical activity . Advanced directives . List of other physicians . Hospitalizations, surgeries, and ER visits in previous 12 months . Vitals . Screenings to include cognitive, depression, and falls . Referrals and appointments  In addition, I have reviewed and discussed with patient certain preventive protocols, quality metrics, and best practice recommendations. A written personalized care plan for preventive services as well as general preventive health recommendations were provided to patient.     Montine Circle, RN  10/05/2018

## 2018-10-06 ENCOUNTER — Ambulatory Visit (INDEPENDENT_AMBULATORY_CARE_PROVIDER_SITE_OTHER): Payer: Medicare Other

## 2018-10-06 VITALS — BP 120/70 | HR 91 | Ht 59.0 in | Wt 205.0 lb

## 2018-10-06 DIAGNOSIS — Z0000 Encounter for general adult medical examination without abnormal findings: Secondary | ICD-10-CM | POA: Diagnosis not present

## 2018-10-06 NOTE — Patient Instructions (Addendum)
[name] , Thank you for taking time to come for your Medicare Wellness Visit. I appreciate your ongoing commitment to your health goals. Please review the following plan we discussed and let me know if I can assist you in the future.   Will make an apt with [facility] for counseling   Will make an apt with Dr. Jerilee Hoh today   Will go to the ER if you have persistent h/a or call the neurologist tomorrow to let them know.    These are the goals we discussed: Goals    . Patient Stated     To get some counseling to be happy        This is a list of the screening recommended for you and due dates:  Health Maintenance  Topic Date Due  .  Hepatitis C: One time screening is recommended by Center for Disease Control  (CDC) for  adults born from [birth year] through 1964.   10-10-1946  . Colon Cancer Screening  10/07/2019*  . Mammogram  02/27/2019  . Tetanus Vaccine  08/11/2021  . Flu Shot  Completed  . DEXA scan (bone density measurement)  Completed  . Pneumonia vaccines  Completed  *Topic was postponed. The date shown is not the original due date.      Fall Prevention in the Home Falls can cause injuries. They can happen to people of all ages. There are many things you can do to make your home safe and to help prevent falls. What can I do on the outside of my home?  Regularly fix the edges of walkways and driveways and fix any cracks.  Remove anything that might make you trip as you walk through a door, such as a raised step or threshold.  Trim any bushes or trees on the path to your home.  Use bright outdoor lighting.  Clear any walking paths of anything that might make someone trip, such as rocks or tools.  Regularly check to see if handrails are loose or broken. Make sure that both sides of any steps have handrails.  Any raised decks and porches should have guardrails on the edges.  Have any leaves, snow, or ice cleared regularly.  Use sand or salt on walking paths  during winter.  Clean up any spills in your garage right away. This includes oil or grease spills. What can I do in the bathroom?  Use night lights.  Install grab bars by the toilet and in the tub and shower. Do not use towel bars as grab bars.  Use non-skid mats or decals in the tub or shower.  If you need to sit down in the shower, use a plastic, non-slip stool.  Keep the floor dry. Clean up any water that spills on the floor as soon as it happens.  Remove soap buildup in the tub or shower regularly.  Attach bath mats securely with double-sided non-slip rug tape.  Do not have throw rugs and other things on the floor that can make you trip. What can I do in the bedroom?  Use night lights.  Make sure that you have a light by your bed that is easy to reach.  Do not use any sheets or blankets that are too big for your bed. They should not hang down onto the floor.  Have a firm chair that has side arms. You can use this for support while you get dressed.  Do not have throw rugs and other things on the floor that  can make you trip. What can I do in the kitchen?  Clean up any spills right away.  Avoid walking on wet floors.  Keep items that you use a lot in easy-to-reach places.  If you need to reach something above you, use a strong step stool that has a grab bar.  Keep electrical cords out of the way.  Do not use floor polish or wax that makes floors slippery. If you must use wax, use non-skid floor wax.  Do not have throw rugs and other things on the floor that can make you trip. What can I do with my stairs?  Do not leave any items on the stairs.  Make sure that there are handrails on both sides of the stairs and use them. Fix handrails that are broken or loose. Make sure that handrails are as long as the stairways.  Check any carpeting to make sure that it is firmly attached to the stairs. Fix any carpet that is loose or worn.  Avoid having throw rugs at the top  or bottom of the stairs. If you do have throw rugs, attach them to the floor with carpet tape.  Make sure that you have a light switch at the top of the stairs and the bottom of the stairs. If you do not have them, ask someone to add them for you. What else can I do to help prevent falls?  Wear shoes that: ? Do not have high heels. ? Have rubber bottoms. ? Are comfortable and fit you well. ? Are closed at the toe. Do not wear sandals.  If you use a stepladder: ? Make sure that it is fully opened. Do not climb a closed stepladder. ? Make sure that both sides of the stepladder are locked into place. ? Ask someone to hold it for you, if possible.  Clearly mark and make sure that you can see: ? Any grab bars or handrails. ? First and last steps. ? Where the edge of each step is.  Use tools that help you move around (mobility aids) if they are needed. These include: ? Canes. ? Walkers. ? Scooters. ? Crutches.  Turn on the lights when you go into a dark area. Replace any light bulbs as soon as they burn out.  Set up your furniture so you have a clear path. Avoid moving your furniture around.  If any of your floors are uneven, fix them.  If there are any pets around you, be aware of where they are.  Review your medicines with your doctor. Some medicines can make you feel dizzy. This can increase your chance of falling. Ask your doctor what other things that you can do to help prevent falls. This information is not intended to replace advice given to you by your health care provider. Make sure you discuss any questions you have with your health care provider. Document Released: 08/16/2009 Document Revised: 03/27/2016 Document Reviewed: 11/24/2014 Elsevier Interactive Patient Education  2018 [employer] Maintenance, Female Adopting a healthy lifestyle and getting preventive care can go a long way to promote health and wellness. Talk with your health care provider about  what schedule of regular examinations is right for you. This is a good chance for you to check in with your provider about disease prevention and staying healthy. In between checkups, there are plenty of things you can do on your own. Experts have done a lot of research about which lifestyle changes and preventive measures are  most likely to keep you healthy. Ask your health care provider for more information. Weight and diet Eat a healthy diet  Be sure to include plenty of vegetables, fruits, low-fat dairy products, and lean protein.  Do not eat a lot of foods high in solid fats, added sugars, or salt.  Get regular exercise. This is one of the most important things you can do for your health. ? Most adults should exercise for at least 150 minutes each week. The exercise should increase your heart rate and make you sweat (moderate-intensity exercise). ? Most adults should also do strengthening exercises at least twice a week. This is in addition to the moderate-intensity exercise.  Maintain a healthy weight  Body mass index (BMI) is a measurement that can be used to identify possible weight problems. It estimates body fat based on height and weight. Your health care provider can help determine your BMI and help you achieve or maintain a healthy weight.  For females 17 years of age and older: ? A BMI below 18.5 is considered underweight. ? A BMI of 18.5 to 24.9 is normal. ? A BMI of 25 to 29.9 is considered overweight. ? A BMI of 30 and above is considered obese.  Watch levels of cholesterol and blood lipids  You should start having your blood tested for lipids and cholesterol at 71 years of age, then have this test every 5 years.  You may need to have your cholesterol levels checked more often if: ? Your lipid or cholesterol levels are high. ? You are older than 71 years of age. ? You are at high risk for heart disease.  Cancer screening Lung Cancer  Lung cancer screening is  recommended for adults 10-56 years old who are at high risk for lung cancer because of a history of smoking.  A yearly low-dose CT scan of the lungs is recommended for people who: ? Currently smoke. ? Have quit within the past 15 years. ? Have at least a 30-pack-year history of smoking. A pack year is smoking an average of one pack of cigarettes a day for 1 year.  Yearly screening should continue until it has been 15 years since you quit.  Yearly screening should stop if you develop a health problem that would prevent you from having lung cancer treatment.  Breast Cancer  Practice breast self-awareness. This means understanding how your breasts normally appear and feel.  It also means doing regular breast self-exams. Let your health care provider know about any changes, no matter how small.  If you are in your 20s or 30s, you should have a clinical breast exam (CBE) by a health care provider every 1-3 years as part of a regular health exam.  If you are 34 or older, have a CBE every year. Also consider having a breast X-ray (mammogram) every year.  If you have a family history of breast cancer, talk to your health care provider about genetic screening.  If you are at high risk for breast cancer, talk to your health care provider about having an MRI and a mammogram every year.  Breast cancer gene (BRCA) assessment is recommended for women who have family members with BRCA-related cancers. BRCA-related cancers include: ? Breast. ? Ovarian. ? Tubal. ? Peritoneal cancers.  Results of the assessment will determine the need for genetic counseling and BRCA1 and BRCA2 testing.  Cervical Cancer Your health care provider may recommend that you be screened regularly for cancer of the pelvic organs (ovaries,  uterus, and vagina). This screening involves a pelvic examination, including checking for microscopic changes to the surface of your cervix (Pap test). You may be encouraged to have this  screening done every 3 years, beginning at age 12.  For women ages 25-65, health care providers may recommend pelvic exams and Pap testing every 3 years, or they may recommend the Pap and pelvic exam, combined with testing for human papilloma virus (HPV), every 5 years. Some types of HPV increase your risk of cervical cancer. Testing for HPV may also be done on women of any age with unclear Pap test results.  Other health care providers may not recommend any screening for nonpregnant women who are considered low risk for pelvic cancer and who do not have symptoms. Ask your health care provider if a screening pelvic exam is right for you.  If you have had past treatment for cervical cancer or a condition that could lead to cancer, you need Pap tests and screening for cancer for at least 20 years after your treatment. If Pap tests have been discontinued, your risk factors (such as having a new sexual partner) need to be reassessed to determine if screening should resume. Some women have medical problems that increase the chance of getting cervical cancer. In these cases, your health care provider may recommend more frequent screening and Pap tests.  Colorectal Cancer  This type of cancer can be detected and often prevented.  Routine colorectal cancer screening usually begins at 71 years of age and continues through 71 years of age.  Your health care provider may recommend screening at an earlier age if you have risk factors for colon cancer.  Your health care provider may also recommend using home test kits to check for hidden blood in the stool.  A small camera at the end of a tube can be used to examine your colon directly (sigmoidoscopy or colonoscopy). This is done to check for the earliest forms of colorectal cancer.  Routine screening usually begins at age 29.  Direct examination of the colon should be repeated every 5-10 years through 71 years of age. However, you may need to be screened  more often if early forms of precancerous polyps or small growths are found.  Skin Cancer  Check your skin from head to toe regularly.  Tell your health care provider about any new moles or changes in moles, especially if there is a change in a mole's shape or color.  Also tell your health care provider if you have a mole that is larger than the size of a pencil eraser.  Always use sunscreen. Apply sunscreen liberally and repeatedly throughout the day.  Protect yourself by wearing long sleeves, pants, a wide-brimmed hat, and sunglasses whenever you are outside.  Heart disease, diabetes, and high blood pressure  High blood pressure causes heart disease and increases the risk of stroke. High blood pressure is more likely to develop in: ? People who have blood pressure in the high end of the normal range (130-139/85-89 mm Hg). ? People who are overweight or obese. ? People who are African American.  If you are 32-46 years of age, have your blood pressure checked every 3-5 years. If you are 65 years of age or older, have your blood pressure checked every year. You should have your blood pressure measured twice-once when you are at a hospital or clinic, and once when you are not at a hospital or clinic. Record the average of the two measurements.  To check your blood pressure when you are not at a hospital or clinic, you can use: ? An automated blood pressure machine at a pharmacy. ? A home blood pressure monitor.  If you are between 80 years and 66 years old, ask your health care provider if you should take aspirin to prevent strokes.  Have regular diabetes screenings. This involves taking a blood sample to check your fasting blood sugar level. ? If you are at a normal weight and have a low risk for diabetes, have this test once every three years after 71 years of age. ? If you are overweight and have a high risk for diabetes, consider being tested at a younger age or more often. Preventing  infection Hepatitis B  If you have a higher risk for hepatitis B, you should be screened for this virus. You are considered at high risk for hepatitis B if: ? You were born in a country where hepatitis B is common. Ask your health care provider which countries are considered high risk. ? Your parents were born in a high-risk country, and you have not been immunized against hepatitis B (hepatitis B vaccine). ? You have HIV or AIDS. ? You use needles to inject street drugs. ? You live with someone who has hepatitis B. ? You have had sex with someone who has hepatitis B. ? You get hemodialysis treatment. ? You take certain medicines for conditions, including cancer, organ transplantation, and autoimmune conditions.  Hepatitis C  Blood testing is recommended for: ? Everyone born from [birth year] through 1964. ? Anyone with known risk factors for hepatitis C.  Sexually transmitted infections (STIs)  You should be screened for sexually transmitted infections (STIs) including gonorrhea and chlamydia if: ? You are sexually active and are younger than 71 years of age. ? You are older than 71 years of age and your health care provider tells you that you are at risk for this type of infection. ? Your sexual activity has changed since you were last screened and you are at an increased risk for chlamydia or gonorrhea. Ask your health care provider if you are at risk.  If you do not have HIV, but are at risk, it may be recommended that you take a prescription medicine daily to prevent HIV infection. This is called pre-exposure prophylaxis (PrEP). You are considered at risk if: ? You are sexually active and do not regularly use condoms or know the HIV status of your partner(s). ? You take drugs by injection. ? You are sexually active with a partner who has HIV.  Talk with your health care provider about whether you are at high risk of being infected with HIV. If you choose to begin PrEP, you should first be  tested for HIV. You should then be tested every 3 months for as long as you are taking PrEP. Pregnancy  If you are premenopausal and you may become pregnant, ask your health care provider about preconception counseling.  If you may become pregnant, take 400 to 800 micrograms (mcg) of folic acid every day.  If you want to prevent pregnancy, talk to your health care provider about birth control (contraception). Osteoporosis and menopause  Osteoporosis is a disease in which the bones lose minerals and strength with aging. This can result in serious bone fractures. Your risk for osteoporosis can be identified using a bone density scan.  If you are 38 years of age or older, or if you are at risk for osteoporosis and  fractures, ask your health care provider if you should be screened.  Ask your health care provider whether you should take a calcium or vitamin D supplement to lower your risk for osteoporosis.  Menopause may have certain physical symptoms and risks.  Hormone replacement therapy may reduce some of these symptoms and risks. Talk to your health care provider about whether hormone replacement therapy is right for you. Follow these instructions at home:  Schedule regular health, dental, and eye exams.  Stay current with your immunizations.  Do not use any tobacco products including cigarettes, chewing tobacco, or electronic cigarettes.  If you are pregnant, do not drink alcohol.  If you are breastfeeding, limit how much and how often you drink alcohol.  Limit alcohol intake to no more than 1 drink per day for nonpregnant women. One drink equals 12 ounces of beer, 5 ounces of wine, or 1 ounces of hard liquor.  Do not use street drugs.  Do not share needles.  Ask your health care provider for help if you need support or information about quitting drugs.  Tell your health care provider if you often feel depressed.  Tell your health care provider if you have ever been abused  or do not feel safe at home. This information is not intended to replace advice given to you by your health care provider. Make sure you discuss any questions you have with your health care provider. Document Released: 05/05/2011 Document Revised: 03/27/2016 Document Reviewed: 07/24/2015 Elsevier Interactive Patient Education  [employer].

## 2018-10-08 ENCOUNTER — Ambulatory Visit: Payer: Medicare Other | Admitting: Internal Medicine

## 2018-10-11 ENCOUNTER — Ambulatory Visit (INDEPENDENT_AMBULATORY_CARE_PROVIDER_SITE_OTHER): Payer: Medicare Other | Admitting: Internal Medicine

## 2018-10-11 ENCOUNTER — Encounter: Payer: Self-pay | Admitting: Internal Medicine

## 2018-10-11 DIAGNOSIS — J9611 Chronic respiratory failure with hypoxia: Secondary | ICD-10-CM | POA: Diagnosis not present

## 2018-10-11 DIAGNOSIS — J449 Chronic obstructive pulmonary disease, unspecified: Secondary | ICD-10-CM

## 2018-10-11 DIAGNOSIS — J9612 Chronic respiratory failure with hypercapnia: Secondary | ICD-10-CM

## 2018-10-11 DIAGNOSIS — I2510 Atherosclerotic heart disease of native coronary artery without angina pectoris: Secondary | ICD-10-CM | POA: Diagnosis not present

## 2018-10-11 MED ORDER — PREDNISONE 10 MG PO TABS: ORAL_TABLET | ORAL | 0 refills | Status: DC

## 2018-10-11 NOTE — Patient Instructions (Addendum)
Prednisone 10 mg take  4 each am x 2 days,   2 each am x 2 days,  1 each am x 2 days and stop   Work on inhaler technique:  relax and gently blow all the way out then take a nice smooth deep breath back in, triggering the inhaler at same time you start breathing in.  Hold for up to 5 seconds if you can. Blow symbicort out thru nose. Rinse and gargle with water when done  Please schedule a follow up visit in 4 months but call sooner if needed  - add consider adding spiriva next ov if not doing better and insurance will cover

## 2018-10-11 NOTE — Progress Notes (Signed)
Subjective:   Patient ID: [name], female    DOB: Jul 02, 1946    MRN: [id number]   Brief patient profile:  55  yowf quit smoking in May 2006 with dx pna/ ards resumed full activity including yardwork at wt 140 and pft's c/w restrictive changes 04/2010 at wt 170 but reported improvement on saba so maintained chronically on symbicort 160  With pfts 02/08/18  c/w GOLD III copd if use FEV1/VC ratio    History of Present Illness  July 18, 2010  1st pulmonary office eval in ER era c/o doe x 6 months progressive indolent onset with copd vs pf.  Ultimately required hosp 8/30-9/1 dx copd/pf ? cause on sulfasalzine and ACE inhib better on 02 and advair but very hoarse with sense of chest congestion and cough with white mucus day > night.  doe x > slow adls.stop advair Start symbicort 160 2 puffs first thing  in am and 2 puffs again in pm about 12 hours later  Work on inhaler technique:   stop benazapril start benicar 40/25  one daily Use 02 sleeping and any activity other than sitting still   04/10/2014 f/u ov/[name] re: restrictive lung dz/ RA/psoriatric/ symbicort 160 2bid  Chief Complaint  Patient presents with  . Follow-up    Pt states was advised to f/u per [employer].  Pt has been having increased SOB- relates to stress from dealing with her spouse's illness. She is using o2 pretty much 24/7.   trying to do more than baseline activity and finds she needs 02 more. rec Work on inhaler technique:   Ok to just use the symbicort 160 2 puffs in am to see what difference if any this makes in your breathing  Change 02 to 2lpm 24/7 and use 3lpm with activity    06/04/15  cabg         11/10/2017  f/u ov/[name] re: AB / pf related to ALI vs RA / MO  Chief Complaint  Patient presents with  . Follow-up    Breathing has been worse over the past 1-2 months. She states she gets winded just walking from her house to her car. She has been using her albuterol inhaler 4 x daily on average.   on 2lpm at  rest and sleeping needed up to 4lpm continous while doing rehab  but only has pulsed to 3lpm at home per [employer] Much better only while on prednisone / poor hfa noted  Doe = MMRC3 = can't walk 100 yards even at a slow pace at a flat grade s stopping due to sob  Even on 02 but not sure about sats as does not check rec Goal is to keep sats above 90%  - call if you want [employer] to re-evaluate your ambulatory needs  Prednisone 10 mg take  4 each am x 2 days,   2 each am x 2 days,  1 each am x 2 days and stop  Work on inhaler technique:        03/24/2018  f/u ov/[name] re:  Copd/pf  GOLD III no change on trelegy vs symb alone / 2lpm 24/7 unless long walk = 3lpm  Chief Complaint  Patient presents with  . Follow-up    Breathing is overall doing well. She is using her albuterol inhaler 2 x per wk on average.   Dyspnea:  Now able to do HT on 3lpm  Cough: no Sleep: 30 degrees propped up on 2lpm  SABA use:  Rarely  rec No change rx    04/05/18  aecopd > pred x 6 days > back to baseline    04/23/2018  f/u ov/[name] re:  GOLD III/ AB / 02 dep  Chief Complaint  Patient presents with  . Follow-up    called 04/05/18 c/o cough, runny nose and wheezing- took pred taper and wheezing has resolved. She c/o runny nose still and occ cough when she laughs. She is using her albuterol inhaler 1-2 x per wk on average.   Dyspnea:  HT on 3lpm  Cough: resolved  Sleep: on 2lpm and 30 degrees SABA use:  Rare now rec Work on maintaining perfect  inhaler technique:      10/11/2018  f/u ov/[name] re:  COPD GOLD III/ 02 dep / maint on just symb 160 2bid (no better with incruse) Chief Complaint  Patient presents with  . Follow-up    Breathing is the same. She is using her albuterol inhaler once per wk on average.   Dyspnea:  HT, sometimes [employer] on 3lpm MMRC3 = can't walk 100 yards even at a slow pace at a flat grade s stopping due to sob   Cough: none Sleeping: on back books under hob plus pillows = 30 degrees  SABA  use: rare  02: 2lpm but turns to 3lpm poc with activity      No obvious day to day or daytime variability or assoc excess/ purulent sputum or mucus plugs or hemoptysis or cp or chest tightness, subjective wheeze or overt sinus or hb symptoms.   Sleeping  As above without nocturnal  or early am exacerbation  of respiratory  c/o's or need for noct saba. Also denies any obvious fluctuation of symptoms with weather or environmental changes or other aggravating or alleviating factors except as outlined above   No unusual exposure hx or h/o childhood pna/ asthma or knowledge of premature birth.  Current Allergies, Complete Past Medical History, Past Surgical History, Family History, and Social History were reviewed in [employer] record.  ROS  The following are not active complaints unless bolded Hoarseness, sore throat, dysphagia, dental problems, itching, sneezing,  nasal congestion or discharge of excess mucus or purulent secretions, ear ache,   fever, chills, sweats, unintended wt loss or wt gain, classically pleuritic or exertional cp,  orthopnea pnd or arm/hand swelling  or leg swelling, presyncope, palpitations, abdominal pain, anorexia, nausea, vomiting, diarrhea  or change in bowel habits or change in bladder habits, change in stools or change in urine, dysuria, hematuria,  rash, arthralgias, visual complaints, headache, numbness, weakness or ataxia or problems with walking or coordination,  change in mood or  memory.        Current Meds  Medication Sig  . acetaminophen (TYLENOL ARTHRITIS PAIN) 650 MG CR tablet Take 1,300 mg by mouth every 8 (eight) hours as needed for pain. Per bottle directions as needed  . albuterol (PROAIR HFA) 108 (90 Base) MCG/ACT inhaler 2 puffs every 4 hours as needed only  if your can't catch your breath  . atorvastatin (LIPITOR) 40 MG tablet Take 1 tablet (40 mg total) by mouth daily at 6 PM.  . Cholecalciferol (VITAMIN D3) 2000 UNITS TABS  Take 2,000 Int'l Units by mouth daily.  . citalopram (CELEXA) 40 MG tablet TAKE 1 TABLET BY MOUTH  EVERY MORNING  . furosemide (LASIX) 20 MG tablet TAKE 1 TABLET BY MOUTH TWO  TIMES DAILY (Patient taking differently: Take 20 mg by mouth daily. )  .  Golimumab (SIMPONI ARIA IV) Inject into the vein every 8 (eight) weeks.  Marland Kitchen levothyroxine (SYNTHROID, LEVOTHROID) 150 MCG tablet TAKE 1 TABLET BY MOUTH  DAILY BEFORE BREAKFAST  . losartan (COZAAR) 50 MG tablet Take 1 tablet (50 mg total) by mouth daily.  . montelukast (SINGULAIR) 10 MG tablet TAKE 1 TABLET BY MOUTH AT  BEDTIME  . NON FORMULARY 2lpm 24/7  . OXYGEN Inhale into the lungs. continuous o2  . pantoprazole (PROTONIX) 40 MG tablet Take 1 tablet (40 mg total) by mouth daily before breakfast.  . SYMBICORT 160-4.5 MCG/ACT inhaler USE 2 PUFFS TWO TIMES DAILY (Patient taking differently: Inhale 2 puffs into the lungs 2 (two) times daily. )  . traMADol (ULTRAM) 50 MG tablet TAKE 1-2 TABLETS EVERY 6 HOURS AS NEEDED FOR PAIN  . triamcinolone cream (KENALOG) 0.1 % Apply 1 application topically 2 (two) times daily as needed (for psorisis).             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       - CT chest 07/03/10 Nonspecific PF mostly upper lobes       - CT chest 12/03/10 acute gg changes and effusions c/w chf        - PFT's  04/12/10 FEV1  1.21 (69%) ratio 77 and no change p B2,  DLC0 56% Asthmatic bronchitis     - HFA 50% p coaching July 18, 2010 >>  90%  02/25/2011  history of mild vitamin D deficiency        Objective:   Physical Exam     amb wf nad  Vital signs  reviewed - Note on arrival 02 sats  97% on 2lpm pulsed        wt 192 July 18, 2010 > 197 August 21, 2010 > 207 December 05, 2010  > 183  01/21/2011  > 179 02/25/2011 >189 08/10/2012 >195 09/08/12 >200 12/27/12 > 202 05/03/2013 > 06/28/2013  199 > 08/08/13 194 > 04/10/2014  203 > 04/19/2015   176 >  07/23/2015 166 > 09/16/2016  210 >  10/29/2016  209 > 04/29/2017    206 > 11/10/2017   209 > 02/08/2018  207 > 03/24/2018 209> 04/23/2018  208> 10/11/2018   208      HEENT: Top denture/ nl oropharynx. Nl external ear canals without cough reflex -  Mild bilateral non-specific turbinate edema     NECK :  without JVD/Nodes/TM/ nl carotid upstrokes bilaterally   LUNGS: no acc muscle use,  Mod barrel  contour chest wall with bilateral  Distant mid exp wheezing but without cough on insp or exp maneuver and mod  Hyperresonant  to  percussion bilaterally     CV:  RRR  no s3 or murmur or increase in P2, and no edema   ABD:  soft and nontender with pos mid insp Hoover's  in the supine position. No bruits or organomegaly appreciated, bowel sounds nl  MS:   Nl gait/  ext warm without deformities, calf tenderness, cyanosis or clubbing No obvious joint restrictions   SKIN: warm and dry without lesions  NEURO:  alert, approp, nl sensorium with  no motor or cerebellar deficits apparent.            I personally reviewed images and agree with radiology impression as follows:  CXR:   08/31/18  No active cardiopulmonary disease

## 2018-10-12 ENCOUNTER — Encounter: Payer: Medicare Other | Admitting: Internal Medicine

## 2018-10-12 ENCOUNTER — Encounter: Payer: Self-pay | Admitting: Internal Medicine

## 2018-10-12 NOTE — Telephone Encounter (Signed)
MW please advise.  Thanks.  

## 2018-10-12 NOTE — Assessment & Plan Note (Signed)
Quit smoking May 2006       - PFT's  04/12/10 FEV1  1.21 (69%) ratio 77 and no change p B2,  DLC0 56%   VC 70%         - PFTs  08/08/2013 FEV1 1.21 (60%) ratio 86 and no change p B2 DLCO 79%  VC 72%  On symbicort 160 2bid  - PFT's  02/08/2018  FEV1 0.70 (40 % ) ratio 56 if use FEV1/VC  p 38 % improvement from saba p symb 160 prior to study with DLCO  78 % corrects to 147  % for alv volume   - 02/08/2018  After extensive coaching inhaler device  effectiveness =    75% with mdi and 90% with dpi so change to Trelegy > no change from sample so rec resume symb 160 2 bid as of 03/24/2018    - 10/11/2018  After extensive coaching inhaler device,  effectiveness =    75% (short ti)  Active wheeze/ air trapping may be limiting IC for adequate delivery of hfa so rec - Prednisone 10 mg take  4 each am x 2 days,   2 each am x 2 days,  1 each am x 2 days and stop  - work on hfa technique - no change maint rx for now , may add spiriva next if insurance covers

## 2018-10-14 ENCOUNTER — Telehealth: Payer: Self-pay | Admitting: Adult Health

## 2018-10-14 NOTE — Telephone Encounter (Signed)
Message sent to [facility] NP about light headaches.

## 2018-10-14 NOTE — Telephone Encounter (Signed)
IF patient calls back please tell her to call [facility] to schedule CT scan. The order was sent to them by Irving Burton on last month. THIs information was left on her vm.

## 2018-10-14 NOTE — Telephone Encounter (Signed)
RN call patient and it went to vm. Rn left vm that [place] sent order of CT can to [facility] imaging. They are suppose to contact her about scheduling Ct scan. Rn left number of [phone] at [facility] imaging to schedule CT scan.

## 2018-10-14 NOTE — Telephone Encounter (Signed)
Pt came in wanting to have an update on a CT scan that was spoken about in her last appt. that has not been scheduled. Also pt states that she still is having light headaches and problems with remembering things like directions. Please call pt at [phone] to advise.

## 2018-10-15 DIAGNOSIS — K6289 Other specified diseases of anus and rectum: Secondary | ICD-10-CM

## 2018-10-15 DIAGNOSIS — Z951 Presence of aortocoronary bypass graft: Secondary | ICD-10-CM

## 2018-10-15 DIAGNOSIS — R7989 Other specified abnormal findings of blood chemistry: Secondary | ICD-10-CM

## 2018-10-15 DIAGNOSIS — E032 Hypothyroidism due to medicaments and other exogenous substances: Secondary | ICD-10-CM

## 2018-10-15 DIAGNOSIS — I10 Essential (primary) hypertension: Secondary | ICD-10-CM

## 2018-10-15 DIAGNOSIS — E782 Mixed hyperlipidemia: Secondary | ICD-10-CM

## 2018-10-15 DIAGNOSIS — R197 Diarrhea, unspecified: Secondary | ICD-10-CM

## 2018-10-15 DIAGNOSIS — R198 Other specified symptoms and signs involving the digestive system and abdomen: Secondary | ICD-10-CM

## 2018-10-15 MED ORDER — ATORVASTATIN CALCIUM 40 MG PO TABS: 40.0000 mg | ORAL_TABLET | Freq: Every day | ORAL | 3 refills | Status: DC

## 2018-10-15 MED ORDER — LOSARTAN POTASSIUM 50 MG PO TABS: 50.0000 mg | ORAL_TABLET | Freq: Every day | ORAL | 3 refills | Status: DC

## 2018-10-15 NOTE — Telephone Encounter (Signed)
Spoke with [name] and provided her with the number to [facility] to call and schedule her CT scan.

## 2018-10-15 NOTE — Telephone Encounter (Signed)
Unable to get in contact with the Mrs. Rorrer. I left her a voicemail asking her to return my call. Office number was provided.

## 2018-10-19 DIAGNOSIS — M0589 Other rheumatoid arthritis with rheumatoid factor of multiple sites: Secondary | ICD-10-CM | POA: Diagnosis not present

## 2018-10-19 DIAGNOSIS — Z79899 Other long term (current) drug therapy: Secondary | ICD-10-CM | POA: Diagnosis not present

## 2018-10-19 NOTE — Progress Notes (Signed)
Medical screening examination/treatment was performed by qualified clinical staff member and, as supervising physician, I was immediately available for consultation/collaboration. I have reviewed documentation and agree with assessment and plan.  Estela Hernandez Acosta, MD   

## 2018-10-22 ENCOUNTER — Ambulatory Visit (INDEPENDENT_AMBULATORY_CARE_PROVIDER_SITE_OTHER): Payer: Medicare Other | Admitting: Internal Medicine

## 2018-10-22 VITALS — BP 120/70 | HR 86 | Temp 99.2°F | Ht 59.0 in | Wt 215.3 lb

## 2018-10-22 DIAGNOSIS — E039 Hypothyroidism, unspecified: Secondary | ICD-10-CM | POA: Diagnosis not present

## 2018-10-22 DIAGNOSIS — I610 Nontraumatic intracerebral hemorrhage in hemisphere, subcortical: Secondary | ICD-10-CM

## 2018-10-22 DIAGNOSIS — E785 Hyperlipidemia, unspecified: Secondary | ICD-10-CM | POA: Diagnosis not present

## 2018-10-22 DIAGNOSIS — J9611 Chronic respiratory failure with hypoxia: Secondary | ICD-10-CM

## 2018-10-22 DIAGNOSIS — I2510 Atherosclerotic heart disease of native coronary artery without angina pectoris: Secondary | ICD-10-CM

## 2018-10-22 DIAGNOSIS — I10 Essential (primary) hypertension: Secondary | ICD-10-CM

## 2018-10-22 DIAGNOSIS — J9612 Chronic respiratory failure with hypercapnia: Secondary | ICD-10-CM

## 2018-10-22 DIAGNOSIS — L4050 Arthropathic psoriasis, unspecified: Secondary | ICD-10-CM | POA: Diagnosis not present

## 2018-10-22 NOTE — Progress Notes (Signed)
Established Patient Office Visit     CC/Reason for Visit: Establish care, follow-up on chronic medical conditions  HPI: [name] is a 72 y.o. female who is coming in today for the above mentioned reasons.  In October she was hospitalized with an intracerebral hemorrhage, she was taken off aspirin, she was noted to be hypertensive at that time which was thought to be the etiology.  She has well-controlled hyperlipidemia on a statin, she has COPD and chronic-her Neck and hypoxic respiratory failure chronic oxygen therapy, she has rheumatoid and psoriatic arthritis and sees a rheumatologist with every 8 week injections of golimumab.  She has depression on Celexa which she feels may not be working as well as she has no desire to be out in public.  She also has GERD that is well controlled on PPI.  History of hypo-thyroidism that is iatrogenic following radioiodine ablation for Graves' disease, she is maintained on Synthroid.  No acute complaints at today's visit.   Past Medical/Surgical History: Past Medical History:  Diagnosis Date  . Acute bronchitis 09/25/2015  . ACUTE ON CHRONIC DIASTOLIC HEART FAILURE 12/04/2010   Qualifier: Diagnosis of  By: Ladona Ridgel, MD, [facility], [place]   . Arthritis    OA RIGHT KNEE WITH PAIN  . Barrett esophagus   . Bradycardia 06/01/2015  . Chronic respiratory failure (HCC)   . Chronic respiratory failure with hypoxia and hypercapnia (HCC) 02/04/2010   Followed in Pulmonary clinic/ [facility]/ [facility]       - 02 dependent  since 07/02/10 >>  83% RA December 05, 2010       - ONO RA 08/05/12  :  Positive sat < 89 x 2:110m> repeat on 2lpm rec 08/12/2012  - 06/17/2013 reported desat with activity p Knee surgery > rec restart 2lpm with activity  - 06/27/2013   Walked 2lpm  x one lap @ 185 stopped due to sat 88% not sob , desat to 82% on RA just at th  . COPD (chronic obstructive pulmonary disease) (HCC)   . COPD III spirometry if use FEV1/VC p saba   07/18/2010   Quit smoking May 2006       - PFT's  04/12/10 FEV1  1.21 (69%) ratio 77 and no change p B2,  DLC0 56%   VC 70%         - PFTs  08/08/2013 FEV1 1.21 (60%) ratio 86 and no change p B2 DLCO 79%  VC 72%  On symbicort 160 2bid  - PFT's  02/08/2018  FEV1 0.70 (40 % ) ratio 56 if use FEV1/VC  p 38 % improvement from saba p symb 160 prior to study with DLCO  78 % corrects to 147  % for alv volume   - 02/08/2018  . Cough variant asthma 02/26/2011   Followed in Pulmonary clinic/ [facility]/ [facility]  - PFT's  06/04/15  FEV1 1.20 (67 % ) ratio 83  p 6 % improvement from saba with DLCO  80 % corrects to 132 % for alv volume      - Clinical dx based on response to symbicort       FENO 09/16/2016  =   96 on symbicort 160 2bid > added singulair  Allergy profile 09/16/2016 >  Eos 0.5 /  IgE  78 neg RAST  -  Referred to rehab 04/29/2017 > completed  . DOE (dyspnea on exertion) 02/19/2016  . Essential hypertension 04/20/2007   Qualifier: Diagnosis of  By: Marcelyn Ditty RN, Katy Fitch   . GERD (gastroesophageal reflux disease)   . History of ARDS 2006  . History of home oxygen therapy    AT NIGHT WHEN SLEEPING 2 L / MIN NASAL CANNULA  . Hyperlipidemia 07/12/2015  . Hypertension   . Hypothyroidism   . Morbid (severe) obesity due to excess calories (HCC) 04/22/2015   pfts with erv 14% 06/04/15  And 33% 02/08/2018   . NSTEMI (non-ST elevated myocardial infarction) (HCC) 05/31/2015  . Pneumococcal pneumonia (HCC) 2006   HOSPITALIZED AND DEVELOPED ARDS  . Psoriatic arthritis (HCC)   . Pulmonary fibrosis (HCC)   . PULMONARY FIBROSIS ILD POST INFLAMMATORY CHRONIC 07/18/2010   Followed as Primary Care Patient/ [facility]/ [facility]  -s/p ARDS 2006 with bacteremic S  Pna       - CT chest 07/03/10 Nonspecific PF mostly upper lobes       - CT chest 12/03/10 acute gg changes and effusions c/w chf - PFT's  02/08/2018  FVC 0.64 (28 %)   with DLCO  78 % corrects to 147 % for alv volume     . Rheumatic disease   . 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  SOB (shortness of breath) on exertion     Past Surgical History:  Procedure Laterality Date  . ABDOMINOPLASTY    . CARDIAC CATHETERIZATION N/A 06/01/2015   Procedure: Left Heart Cath and Coronary Angiography;  Surgeon: Runell Gess, MD;  Location: [facility] INVASIVE CV LAB;  Service: Cardiovascular;  Laterality: N/A;  . CARPAL TUNNEL RELEASE    . CHOLECYSTECTOMY    . CORONARY ARTERY BYPASS GRAFT N/A 06/04/2015   Procedure: CORONARY ARTERY BYPASS GRAFT times three            with left internal mammary artery and right leg saphenous vein;  Surgeon: Alleen Borne, MD;  Location: [facility] OR;  Service: Open Heart Surgery;  Laterality: N/A;  . cosmetic breast surgery    . JOINT REPLACEMENT    . KNEE ARTHROSCOPY Left   . TEE WITHOUT CARDIOVERSION  06/04/2015   Procedure: TRANSESOPHAGEAL ECHOCARDIOGRAM (TEE);  Surgeon: Alleen Borne, MD;  Location: [facility] OR;  Service: Open Heart Surgery;;  . TOTAL KNEE ARTHROPLASTY Left   . TOTAL KNEE ARTHROPLASTY Right 06/06/2013   Procedure: RIGHT TOTAL KNEE ARTHROPLASTY;  Surgeon: Loanne Drilling, MD;  Location: [facility] ORS;  Service: Orthopedics;  Laterality: Right;    Social History:  reports that she quit smoking about 13 years ago. Her smoking use included cigarettes. She has a 87.75 pack-year smoking history. She has never used smokeless tobacco. She reports that she does not drink alcohol or use drugs.  Allergies: No Known Allergies  Family History:  Family History  Problem Relation Age of Onset  . Breast cancer Mother   . Coronary artery disease Father   . Rheum arthritis Father      Current Outpatient Medications:  .  acetaminophen (TYLENOL ARTHRITIS PAIN) 650 MG CR tablet, Take 1,300 mg by mouth every 8 (eight) hours as needed for pain. Per bottle directions as needed, Disp: , Rfl:  .  albuterol (PROAIR HFA) 108 (90 Base) MCG/ACT inhaler, 2 puffs every 4 hours as needed only  if your can't catch your breath, Disp: 1 Inhaler, Rfl: 11 .  atorvastatin (LIPITOR)  40 MG tablet, Take 1 tablet (40 mg total) by mouth daily at 6 PM., Disp: 90 tablet, Rfl: 3 .  Cholecalciferol (VITAMIN D3) 2000 UNITS TABS, Take 2,000  Int'l Units by mouth daily., Disp: , Rfl:  .  citalopram (CELEXA) 40 MG tablet, TAKE 1 TABLET BY MOUTH  EVERY MORNING, Disp: 90 tablet, Rfl: 0 .  furosemide (LASIX) 20 MG tablet, TAKE 1 TABLET BY MOUTH TWO  TIMES DAILY (Patient taking differently: Take 20 mg by mouth daily. ), Disp: 180 tablet, Rfl: 2 .  Golimumab (SIMPONI ARIA IV), Inject into the vein every 8 (eight) weeks., Disp: , Rfl:  .  levothyroxine (SYNTHROID, LEVOTHROID) 150 MCG tablet, TAKE 1 TABLET BY MOUTH  DAILY BEFORE BREAKFAST, Disp: 90 tablet, Rfl: 2 .  losartan (COZAAR) 50 MG tablet, Take 1 tablet (50 mg total) by mouth daily., Disp: 90 tablet, Rfl: 3 .  montelukast (SINGULAIR) 10 MG tablet, TAKE 1 TABLET BY MOUTH AT  BEDTIME, Disp: 90 tablet, Rfl: 3 .  NON FORMULARY, 2lpm 24/7, Disp: , Rfl:  .  OXYGEN, Inhale into the lungs. continuous o2, Disp: , Rfl:  .  pantoprazole (PROTONIX) 40 MG tablet, Take 1 tablet (40 mg total) by mouth daily before breakfast., Disp: 90 tablet, Rfl: 1 .  SYMBICORT 160-4.5 MCG/ACT inhaler, USE 2 PUFFS TWO TIMES DAILY (Patient taking differently: Inhale 2 puffs into the lungs 2 (two) times daily. ), Disp: 30.6 g, Rfl: 1 .  traMADol (ULTRAM) 50 MG tablet, TAKE 1-2 TABLETS EVERY 6 HOURS AS NEEDED FOR PAIN, Disp: 90 tablet, Rfl: 0 .  triamcinolone cream (KENALOG) 0.1 %, Apply 1 application topically 2 (two) times daily as needed (for psorisis)., Disp: , Rfl:   Review of Systems:  Constitutional: Denies fever, chills, diaphoresis, appetite change and fatigue.  HEENT: Denies photophobia, eye pain, redness, hearing loss, ear pain, congestion, sore throat, rhinorrhea, sneezing, mouth sores, trouble swallowing, neck pain, neck stiffness and tinnitus.   Respiratory: Denies SOB, DOE, cough, chest tightness,  and wheezing.   Cardiovascular: Denies chest pain,  palpitations and leg swelling.  Gastrointestinal: Denies nausea, vomiting, abdominal pain, diarrhea, constipation, blood in stool and abdominal distention.  Genitourinary: Denies dysuria, urgency, frequency, hematuria, flank pain and difficulty urinating.  Endocrine: Denies: hot or cold intolerance, sweats, changes in hair or nails, polyuria, polydipsia. Musculoskeletal: Denies myalgias, back pain, joint swelling, arthralgias and gait problem.  Skin: Denies pallor, rash and wound.  Neurological: Denies dizziness, seizures, syncope, weakness, light-headedness, numbness and headaches.  Hematological: Denies adenopathy. Easy bruising, personal or family bleeding history  Psychiatric/Behavioral: Denies suicidal ideation, mood changes, confusion, nervousness, sleep disturbance and agitation    Physical Exam: Vitals:   10/22/18 1529  BP: 120/70  Pulse: 86  Temp: 99.2 F (37.3 C)  TempSrc: Oral  SpO2: 93%  Weight: 215 lb 4.8 oz (97.7 kg)  Height: 4\' 11"  (1.499 m)    Body mass index is 43.49 kg/m.   Constitutional: NAD, calm, comfortable Eyes: PERRL, lids and conjunctivae normal ENMT: Mucous membranes are moist.  Neck: normal, supple, no masses, no thyromegaly Respiratory: clear to auscultation bilaterally, no wheezing, no crackles. Normal respiratory effort. No accessory muscle use.  Cardiovascular: Regular rate and rhythm, no murmurs / rubs / gallops. No extremity edema. 2+ pedal pulses. No carotid bruits.  Abdomen: no tenderness, no masses palpated. No hepatosplenomegaly. Bowel sounds positive.  Musculoskeletal: no clubbing / cyanosis. No joint deformity upper and lower extremities. Good ROM, no contractures. Normal muscle tone.  Skin: no rashes, lesions, ulcers. No induration Neurologic: Grossly intact and nonfocal Psychiatric: Normal judgment and insight. Alert and oriented x 3. Normal mood.    Impression and Plan:  Acquired hypothyroidism -  Following radioiodine ablation for  Graves' disease, well-controlled on Synthroid with most recent TSH of 0.489 in August 2019.  Essential hypertension -Well-controlled on current regimen.  PSORIATIC ARTHRITIS -Followed by rheumatology on Cutivate week injections of golimumab.  Chronic respiratory failure with hypoxia and hypercapnia (HCC) -Due to COPD and probably a component of obesity hypoventilation that is currently well compensated.  She does use oxygen.  Morbid (severe) obesity due to excess calories (HCC) -We have discussed lifestyle modifications at length today including increased physical activity and better food choices.  Nontraumatic subcortical hemorrhage of right cerebral hemisphere [facility]) -Presumably due to hypertension and aspirin use. -She has followed up with neurology since discharge, has no perceivable deficits from this event.  Hyperlipidemia, unspecified hyperlipidemia type -On statin therapy, LDL is very well controlled at 52.  Chronic diastolic heart failure -2D echo from October 2018 with ejection fraction of 65 to 70% and grade 1 diastolic dysfunction. -Is well compensated at present. -On statin, not on aspirin due to recent intracerebral hemorrhage, on ARB.    Patient Instructions  -It was nice meeting you today!  -PHQ-9 today (depression screening).  -Schedule follow up in 4-6 months for routine follow up.     Chaya Jan, MD [facility]

## 2018-10-22 NOTE — Patient Instructions (Signed)
-  It was nice meeting you today!  -PHQ-9 today (depression screening).  -Schedule follow up in 4-6 months for routine follow up.

## 2018-10-28 ENCOUNTER — Emergency Department (HOSPITAL_COMMUNITY): Payer: Medicare Other

## 2018-10-28 ENCOUNTER — Other Ambulatory Visit: Payer: Self-pay

## 2018-10-28 ENCOUNTER — Inpatient Hospital Stay (HOSPITAL_COMMUNITY)
Admission: EM | Admit: 2018-10-28 | Discharge: 2018-10-30 | DRG: 193 | Disposition: A | Discharge status: Home Health | Payer: Medicare Other | Attending: Internal Medicine | Admitting: Internal Medicine

## 2018-10-28 DIAGNOSIS — Z951 Presence of aortocoronary bypass graft: Secondary | ICD-10-CM | POA: Diagnosis not present

## 2018-10-28 DIAGNOSIS — Z8249 Family history of ischemic heart disease and other diseases of the circulatory system: Secondary | ICD-10-CM

## 2018-10-28 DIAGNOSIS — I10 Essential (primary) hypertension: Secondary | ICD-10-CM | POA: Diagnosis present

## 2018-10-28 DIAGNOSIS — Z79899 Other long term (current) drug therapy: Secondary | ICD-10-CM | POA: Diagnosis not present

## 2018-10-28 DIAGNOSIS — R079 Chest pain, unspecified: Secondary | ICD-10-CM | POA: Diagnosis not present

## 2018-10-28 DIAGNOSIS — J9622 Acute and chronic respiratory failure with hypercapnia: Secondary | ICD-10-CM | POA: Diagnosis present

## 2018-10-28 DIAGNOSIS — Z9049 Acquired absence of other specified parts of digestive tract: Secondary | ICD-10-CM

## 2018-10-28 DIAGNOSIS — Z9981 Dependence on supplemental oxygen: Secondary | ICD-10-CM | POA: Diagnosis not present

## 2018-10-28 DIAGNOSIS — J9611 Chronic respiratory failure with hypoxia: Secondary | ICD-10-CM | POA: Diagnosis not present

## 2018-10-28 DIAGNOSIS — I619 Nontraumatic intracerebral hemorrhage, unspecified: Secondary | ICD-10-CM | POA: Diagnosis present

## 2018-10-28 DIAGNOSIS — Z803 Family history of malignant neoplasm of breast: Secondary | ICD-10-CM

## 2018-10-28 DIAGNOSIS — Z87891 Personal history of nicotine dependence: Secondary | ICD-10-CM | POA: Diagnosis not present

## 2018-10-28 DIAGNOSIS — J9612 Chronic respiratory failure with hypercapnia: Secondary | ICD-10-CM

## 2018-10-28 DIAGNOSIS — E860 Dehydration: Secondary | ICD-10-CM | POA: Diagnosis present

## 2018-10-28 DIAGNOSIS — E039 Hypothyroidism, unspecified: Secondary | ICD-10-CM | POA: Diagnosis present

## 2018-10-28 DIAGNOSIS — Z79890 Hormone replacement therapy (postmenopausal): Secondary | ICD-10-CM

## 2018-10-28 DIAGNOSIS — I110 Hypertensive heart disease with heart failure: Secondary | ICD-10-CM | POA: Diagnosis present

## 2018-10-28 DIAGNOSIS — I252 Old myocardial infarction: Secondary | ICD-10-CM | POA: Diagnosis not present

## 2018-10-28 DIAGNOSIS — Z8261 Family history of arthritis: Secondary | ICD-10-CM | POA: Diagnosis not present

## 2018-10-28 DIAGNOSIS — I5033 Acute on chronic diastolic (congestive) heart failure: Secondary | ICD-10-CM | POA: Diagnosis present

## 2018-10-28 DIAGNOSIS — K219 Gastro-esophageal reflux disease without esophagitis: Secondary | ICD-10-CM | POA: Diagnosis present

## 2018-10-28 DIAGNOSIS — J440 Chronic obstructive pulmonary disease with acute lower respiratory infection: Secondary | ICD-10-CM | POA: Diagnosis present

## 2018-10-28 DIAGNOSIS — Z79891 Long term (current) use of opiate analgesic: Secondary | ICD-10-CM

## 2018-10-28 DIAGNOSIS — R05 Cough: Secondary | ICD-10-CM | POA: Diagnosis not present

## 2018-10-28 DIAGNOSIS — R0602 Shortness of breath: Secondary | ICD-10-CM

## 2018-10-28 DIAGNOSIS — J189 Pneumonia, unspecified organism: Secondary | ICD-10-CM | POA: Diagnosis not present

## 2018-10-28 DIAGNOSIS — E785 Hyperlipidemia, unspecified: Secondary | ICD-10-CM | POA: Diagnosis present

## 2018-10-28 DIAGNOSIS — I214 Non-ST elevation (NSTEMI) myocardial infarction: Secondary | ICD-10-CM | POA: Diagnosis present

## 2018-10-28 DIAGNOSIS — N179 Acute kidney failure, unspecified: Secondary | ICD-10-CM | POA: Diagnosis present

## 2018-10-28 DIAGNOSIS — J9621 Acute and chronic respiratory failure with hypoxia: Secondary | ICD-10-CM | POA: Diagnosis present

## 2018-10-28 DIAGNOSIS — J449 Chronic obstructive pulmonary disease, unspecified: Secondary | ICD-10-CM | POA: Diagnosis not present

## 2018-10-28 DIAGNOSIS — Z96653 Presence of artificial knee joint, bilateral: Secondary | ICD-10-CM | POA: Diagnosis present

## 2018-10-28 DIAGNOSIS — Z7951 Long term (current) use of inhaled steroids: Secondary | ICD-10-CM

## 2018-10-28 DIAGNOSIS — K2270 Barrett's esophagus without dysplasia: Secondary | ICD-10-CM | POA: Diagnosis present

## 2018-10-28 DIAGNOSIS — Z8673 Personal history of transient ischemic attack (TIA), and cerebral infarction without residual deficits: Secondary | ICD-10-CM | POA: Diagnosis not present

## 2018-10-28 LAB — CBC WITH DIFFERENTIAL/PLATELET
Abs Immature Granulocytes: 0 10*3/uL (ref 0.00–0.07)
Basophils Absolute: 0 10*3/uL (ref 0.0–0.1)
Basophils Relative: 0 %
Eosinophils Absolute: 0 10*3/uL (ref 0.0–0.5)
Eosinophils Relative: 0 %
HCT: 32.9 % — ABNORMAL LOW (ref 36.0–46.0)
Hemoglobin: 9.3 g/dL — ABNORMAL LOW (ref 12.0–15.0)
Lymphocytes Relative: 3 %
Lymphs Abs: 0.5 10*3/uL — ABNORMAL LOW (ref 0.7–4.0)
MCH: 27.9 pg (ref 26.0–34.0)
MCHC: 28.3 g/dL — ABNORMAL LOW (ref 30.0–36.0)
MCV: 98.8 fL (ref 80.0–100.0)
Monocytes Absolute: 0.6 10*3/uL (ref 0.1–1.0)
Monocytes Relative: 4 %
Neutro Abs: 14.2 10*3/uL — ABNORMAL HIGH (ref 1.7–7.7)
Neutrophils Relative %: 93 %
Platelets: 189 10*3/uL (ref 150–400)
RBC: 3.33 MIL/uL — ABNORMAL LOW (ref 3.87–5.11)
RDW: 15 % (ref 11.5–15.5)
WBC: 15.3 10*3/uL — ABNORMAL HIGH (ref 4.0–10.5)
nRBC: 0 % (ref 0.0–0.2)
nRBC: 0 /100 WBC

## 2018-10-28 LAB — COMPREHENSIVE METABOLIC PANEL
ALT: 17 U/L (ref 0–44)
AST: 20 U/L (ref 15–41)
Albumin: 3 g/dL — ABNORMAL LOW (ref 3.5–5.0)
Alkaline Phosphatase: 44 U/L (ref 38–126)
Anion gap: 10 (ref 5–15)
BUN: 34 mg/dL — ABNORMAL HIGH (ref 8–23)
CO2: 36 mmol/L — ABNORMAL HIGH (ref 22–32)
Calcium: 8.9 mg/dL (ref 8.9–10.3)
Chloride: 94 mmol/L — ABNORMAL LOW (ref 98–111)
Creatinine, Ser: 1.28 mg/dL — ABNORMAL HIGH (ref 0.44–1.00)
GFR calc Af Amer: 49 mL/min — ABNORMAL LOW (ref >60)
GFR calc non Af Amer: 42 mL/min — ABNORMAL LOW (ref >60)
Glucose, Bld: 121 mg/dL — ABNORMAL HIGH (ref 70–99)
Potassium: 4.2 mmol/L (ref 3.5–5.1)
Sodium: 140 mmol/L (ref 135–145)
Total Bilirubin: 0.7 mg/dL (ref 0.3–1.2)
Total Protein: 6.6 g/dL (ref 6.5–8.1)

## 2018-10-28 LAB — I-STAT TROPONIN, ED: Troponin i, poc: 0.03 ng/mL (ref 0.00–0.08)

## 2018-10-28 LAB — BRAIN NATRIURETIC PEPTIDE: B Natriuretic Peptide: 226.5 pg/mL — ABNORMAL HIGH (ref 0.0–100.0)

## 2018-10-28 MED ORDER — MOMETASONE FURO-FORMOTEROL FUM 200-5 MCG/ACT IN AERO
2.0000 | INHALATION_SPRAY | Freq: Two times a day (BID) | RESPIRATORY_TRACT | Status: DC
  Administered 2018-10-28 – 2018-10-30 (×4): 2 via RESPIRATORY_TRACT
  Filled 2018-10-28: qty 8.8

## 2018-10-28 MED ORDER — ACETAMINOPHEN 650 MG RE SUPP: 650.0000 mg | Freq: Four times a day (QID) | RECTAL | Status: DC | PRN

## 2018-10-28 MED ORDER — CITALOPRAM HYDROBROMIDE 10 MG PO TABS: 40.0000 mg | ORAL_TABLET | Freq: Every day | ORAL | Status: DC

## 2018-10-28 MED ORDER — IOPAMIDOL (ISOVUE-370) INJECTION 76%
INTRAVENOUS | Status: AC
  Filled 2018-10-28: qty 100

## 2018-10-28 MED ORDER — SODIUM CHLORIDE 0.9% FLUSH
3.0000 mL | Freq: Two times a day (BID) | INTRAVENOUS | Status: DC
  Administered 2018-10-28: 3 mL via INTRAVENOUS

## 2018-10-28 MED ORDER — PANTOPRAZOLE SODIUM 40 MG PO TBEC
40.0000 mg | DELAYED_RELEASE_TABLET | Freq: Every day | ORAL | Status: DC
  Administered 2018-10-29 – 2018-10-30 (×2): 40 mg via ORAL
  Filled 2018-10-28 (×2): qty 1

## 2018-10-28 MED ORDER — SODIUM CHLORIDE 0.9% FLUSH: 3.0000 mL | INTRAVENOUS | Status: DC | PRN

## 2018-10-28 MED ORDER — MONTELUKAST SODIUM 10 MG PO TABS
10.0000 mg | ORAL_TABLET | Freq: Every day | ORAL | Status: DC
  Administered 2018-10-28 – 2018-10-29 (×2): 10 mg via ORAL
  Filled 2018-10-28 (×2): qty 1

## 2018-10-28 MED ORDER — LEVOTHYROXINE SODIUM 150 MCG PO TABS
150.0000 ug | ORAL_TABLET | Freq: Every day | ORAL | Status: DC
  Administered 2018-10-29 – 2018-10-30 (×3): 150 ug via ORAL
  Filled 2018-10-28: qty 1
  Filled 2018-10-28: qty 2
  Filled 2018-10-28: qty 1
  Filled 2018-10-28: qty 2

## 2018-10-28 MED ORDER — ENOXAPARIN SODIUM 40 MG/0.4ML ~~LOC~~ SOLN
40.0000 mg | SUBCUTANEOUS | Status: DC
  Administered 2018-10-28 – 2018-10-29 (×2): 40 mg via SUBCUTANEOUS
  Filled 2018-10-28 (×2): qty 0.4

## 2018-10-28 MED ORDER — FUROSEMIDE 10 MG/ML IJ SOLN
60.0000 mg | Freq: Once | INTRAMUSCULAR | Status: AC
  Administered 2018-10-28: 60 mg via INTRAVENOUS
  Filled 2018-10-28: qty 6

## 2018-10-28 MED ORDER — LEVOFLOXACIN IN D5W 750 MG/150ML IV SOLN
750.0000 mg | INTRAVENOUS | Status: DC
  Filled 2018-10-28: qty 150

## 2018-10-28 MED ORDER — SODIUM CHLORIDE 0.9 % IV SOLN: 250.0000 mL | INTRAVENOUS | Status: DC | PRN

## 2018-10-28 MED ORDER — ATORVASTATIN CALCIUM 40 MG PO TABS
40.0000 mg | ORAL_TABLET | Freq: Every day | ORAL | Status: DC
  Administered 2018-10-28 – 2018-10-29 (×2): 40 mg via ORAL
  Filled 2018-10-28 (×2): qty 1

## 2018-10-28 MED ORDER — TRAMADOL HCL 50 MG PO TABS
50.0000 mg | ORAL_TABLET | Freq: Four times a day (QID) | ORAL | Status: DC | PRN
  Administered 2018-10-29 – 2018-10-30 (×3): 50 mg via ORAL
  Filled 2018-10-28 (×3): qty 1

## 2018-10-28 MED ORDER — LEVOFLOXACIN IN D5W 750 MG/150ML IV SOLN
750.0000 mg | Freq: Once | INTRAVENOUS | Status: AC
  Administered 2018-10-28: 750 mg via INTRAVENOUS
  Filled 2018-10-28: qty 150

## 2018-10-28 MED ORDER — IPRATROPIUM-ALBUTEROL 0.5-2.5 (3) MG/3ML IN SOLN
3.0000 mL | Freq: Four times a day (QID) | RESPIRATORY_TRACT | Status: DC
  Administered 2018-10-28 – 2018-10-29 (×4): 3 mL via RESPIRATORY_TRACT
  Filled 2018-10-28 (×4): qty 3

## 2018-10-28 MED ORDER — IOPAMIDOL (ISOVUE-370) INJECTION 76%
75.0000 mL | Freq: Once | INTRAVENOUS | Status: AC | PRN
  Administered 2018-10-28: 75 mL via INTRAVENOUS

## 2018-10-28 MED ORDER — SENNOSIDES-DOCUSATE SODIUM 8.6-50 MG PO TABS: 1.0000 | ORAL_TABLET | Freq: Every evening | ORAL | Status: DC | PRN

## 2018-10-28 MED ORDER — ACETAMINOPHEN 325 MG PO TABS
650.0000 mg | ORAL_TABLET | Freq: Four times a day (QID) | ORAL | Status: DC | PRN
  Administered 2018-10-29 – 2018-10-30 (×3): 650 mg via ORAL
  Filled 2018-10-28 (×3): qty 2

## 2018-10-28 NOTE — Progress Notes (Signed)
Pharmacy Antibiotic Note  [name] is a 72 y.o. female admitted on 10/28/2018 with COPD exacerbation.  Pharmacy has been consulted for Levaquin dosing.  D/w Dr. Thedore Mins about alternative options in 72 y/o pt on citalopram. He would like to keep Levaquin and will hold citalopram for a few days.  Plan: Levaquin 750mg  IV q48h Will f/u micro data, pt's clinical condition, and renal function     Temp (24hrs), Avg:99 F (37.2 C), Min:99 F (37.2 C), Max:99 F (37.2 C)  Recent Labs  Lab 10/28/18 1400  WBC 15.3*  CREATININE 1.28*    Estimated Creatinine Clearance: 41.4 mL/min (A) (by C-G formula based on SCr of 1.28 mg/dL (H)).    No Known Allergies  Antimicrobials this admission: 12/26 Levaquin >>  Microbiology results: 12/26 BCx:  12/26 Sputum:    Thank you for allowing pharmacy to be a part of this patient's care.  1/27, PharmD, BCPS Clinical pharmacist  **Pharmacist phone directory can now be found on [email or web address] ([email or web address]).  Listed under [facility]. 10/28/2018 4:55 PM

## 2018-10-28 NOTE — ED Triage Notes (Signed)
Pt reports SHOB x 2-3 days. Pt also reports pain to L chest. Pt wears 2 L [place] at home all the time, O2 sats 88%. Pt also reports swelling to lower extremities, report she increased her diuretic. Pt reports recent brain bleed, reports she has been having headaches.

## 2018-10-28 NOTE — H&P (Signed)
TRH H&P   Patient Demographics:    [name], is a 72 y.o. female  MRN: [id number]   DOB - 02/18/1946  Admit Date - 10/28/2018  Outpatient Primary MD for the patient is Philip AspenHernandez Acosta, Limmie PatriciaEstela Y, MD  Outpatient Specialists: Dr Sherene SiresWert, Dr. Eloy EndK.Nelson    Patient coming from: Home  Chief Complaint  Patient presents with  . Shortness of Breath      HPI:    Amber Groutula Yott  is a 72 y.o. female, history of COPD on home oxygen 2 L nasal cannula per minute, CAD status post CABG in 2016, dyslipidemia, GERD, hypertension, hypothyroidism, morbid obesity.  And with present history comes with 2 to 3-day history of cough and progressive shortness of breath, does have baseline orthopnea and no change in that pattern, denies any fever or exposure to sick contacts, symptoms worse with exertion better with rest.  She does say that about 7 to 10 days ago she became little short of breath and wheezy at that point she saw her pulmonologist and was put on prednisone without much benefit.  For the last 2 days she has developed a dry cough with progressively worsening shortness of breath.  No chest pain or palpitations, in the ER work-up consistent with community-acquired pneumonia and I was requested to admit the patient.    Review of systems:     A full 10 point Review of Systems was done, except as stated above, all other Review of Systems were negative.   With Past History of the following :    Past Medical History:  Diagnosis Date  . Acute bronchitis 09/25/2015  . ACUTE ON CHRONIC DIASTOLIC HEART FAILURE 12/04/2010   Qualifier: Diagnosis of  By: Ladona Ridgelaylor, MD, [facility], Vergia AlconGregg William   . Arthritis    OA RIGHT KNEE WITH PAIN  . Barrett  esophagus   . Bradycardia 06/01/2015  . Chronic respiratory failure (HCC)   . Chronic respiratory failure with hypoxia and hypercapnia (HCC) 02/04/2010   Followed in Pulmonary clinic/ [facility]/ [facility]       - 02 dependent  since 07/02/10 >>  83% RA December 05, 2010       - ONO RA 08/05/12  :  Positive sat < 89 x 2:5950m> repeat on 2lpm rec 08/12/2012  - 06/17/2013 reported desat with activity p  Knee surgery > rec restart 2lpm with activity  - 06/27/2013   Walked 2lpm  x one lap @ 185 stopped due to sat 88% not sob , desat to 82% on RA just at th  . COPD (chronic obstructive pulmonary disease) (HCC)   . COPD III spirometry if use FEV1/VC p saba  07/18/2010   Quit smoking May 2006       - PFT's  04/12/10 FEV1  1.21 (69%) ratio 77 and no change p B2,  DLC0 56%   VC 70%         - PFTs  08/08/2013 FEV1 1.21 (60%) ratio 86 and no change p B2 DLCO 79%  VC 72%  On symbicort 160 2bid  - PFT's  02/08/2018  FEV1 0.70 (40 % ) ratio 56 if use FEV1/VC  p 38 % improvement from saba p symb 160 prior to study with DLCO  78 % corrects to 147  % for alv volume   - 02/08/2018  . Cough variant asthma 02/26/2011   Followed in Pulmonary clinic/ [facility]/ [facility]  - PFT's  06/04/15  FEV1 1.20 (67 % ) ratio 83  p 6 % improvement from saba with DLCO  80 % corrects to 132 % for alv volume      - Clinical dx based on response to symbicort       FENO 09/16/2016  =   96 on symbicort 160 2bid > added singulair  Allergy profile 09/16/2016 >  Eos 0.5 /  IgE  78 neg RAST  -  Referred to rehab 04/29/2017 > completed  . DOE (dyspnea on exertion) 02/19/2016  . Essential hypertension 04/20/2007   Qualifier: Diagnosis of  By: Marcelyn Ditty RN, Katy Fitch   . GERD (gastroesophageal reflux disease)   . History of ARDS 2006  . History of home oxygen therapy    AT NIGHT WHEN SLEEPING 2 L / MIN NASAL CANNULA  . Hyperlipidemia 07/12/2015  . Hypertension   . Hypothyroidism   . Morbid (severe) obesity due to excess calories (HCC) 04/22/2015   pfts with erv  14% 06/04/15  And 33% 02/08/2018   . NSTEMI (non-ST elevated myocardial infarction) (HCC) 05/31/2015  . Pneumococcal pneumonia (HCC) 2006   HOSPITALIZED AND DEVELOPED ARDS  . Psoriatic arthritis (HCC)   . Pulmonary fibrosis (HCC)   . PULMONARY FIBROSIS ILD POST INFLAMMATORY CHRONIC 07/18/2010   Followed as Primary Care Patient/ [facility]/ [facility]  -s/p ARDS 2006 with bacteremic S  Pna       - CT chest 07/03/10 Nonspecific PF mostly upper lobes       - CT chest 12/03/10 acute gg changes and effusions c/w chf - PFT's  02/08/2018  FVC 0.64 (28 %)   with DLCO  78 % corrects to 147 % for alv volume     . Rheumatic disease   . 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 SOB (shortness of breath) on exertion       Past Surgical History:  Procedure Laterality Date  . ABDOMINOPLASTY    . CARDIAC CATHETERIZATION N/A 06/01/2015   Procedure: Left Heart Cath and Coronary Angiography;  Surgeon: Runell Gess, MD;  Location: [facility] INVASIVE CV LAB;  Service: Cardiovascular;  Laterality: N/A;  . CARPAL TUNNEL RELEASE    . CHOLECYSTECTOMY    . CORONARY ARTERY BYPASS GRAFT N/A 06/04/2015   Procedure: CORONARY ARTERY BYPASS GRAFT times three            with left internal  mammary artery and right leg saphenous vein;  Surgeon: Alleen Borne, MD;  Location: [facility] OR;  Service: Open Heart Surgery;  Laterality: N/A;  . cosmetic breast surgery    . JOINT REPLACEMENT    . KNEE ARTHROSCOPY Left   . TEE WITHOUT CARDIOVERSION  06/04/2015   Procedure: TRANSESOPHAGEAL ECHOCARDIOGRAM (TEE);  Surgeon: Alleen Borne, MD;  Location: [facility] OR;  Service: Open Heart Surgery;;  . TOTAL KNEE ARTHROPLASTY Left   . TOTAL KNEE ARTHROPLASTY Right 06/06/2013   Procedure: RIGHT TOTAL KNEE ARTHROPLASTY;  Surgeon: Loanne Drilling, MD;  Location: [facility] ORS;  Service: Orthopedics;  Laterality: Right;      Social History:     Social History   Tobacco Use  . Smoking status: Former Smoker    Packs/day: 1.50    Years: 58.50    Pack years: 87.75    Types:  Cigarettes    Last attempt to quit: 03/03/2005    Years since quitting: 13.6  . Smokeless tobacco: Never Used  Substance Use Topics  . Alcohol use: No         Family History :     Family History  Problem Relation Age of Onset  . Breast cancer Mother   . Coronary artery disease Father   . Rheum arthritis Father        Home Medications:   Prior to Admission medications   Medication Sig Start Date End Date Taking? Authorizing Provider  acetaminophen (TYLENOL ARTHRITIS PAIN) 650 MG CR tablet Take 1,300 mg by mouth every 8 (eight) hours as needed for pain. Per bottle directions as needed   Yes [provider]  albuterol (PROAIR HFA) 108 (90 Base) MCG/ACT inhaler 2 puffs every 4 hours as needed only  if your can't catch your breath Patient taking differently: Inhale 2 puffs into the lungs every 4 (four) hours as needed for shortness of breath. as needed only  if your can't catch your breath 07/22/16  Yes Nyoka Cowden, MD  atorvastatin (LIPITOR) 40 MG tablet Take 1 tablet (40 mg total) by mouth daily at 6 PM. 10/15/18  Yes Lars Masson, MD  Cholecalciferol (VITAMIN D3) 2000 UNITS TABS Take 2,000 Int'l Units by mouth daily.   Yes [provider]  citalopram (CELEXA) 40 MG tablet TAKE 1 TABLET BY MOUTH  EVERY MORNING Patient taking differently: Take 40 mg by mouth daily.  09/14/18  Yes Roderick Pee, MD  furosemide (LASIX) 20 MG tablet TAKE 1 TABLET BY MOUTH TWO  TIMES DAILY Patient taking differently: Take 40 mg by mouth daily.  03/30/18  Yes Lars Masson, MD  golimumab (SIMPONI ARIA) 50 MG/4ML SOLN injection Inject 50 mg into the vein every 8 (eight) weeks.    Yes [provider]  levothyroxine (SYNTHROID, LEVOTHROID) 150 MCG tablet TAKE 1 TABLET BY MOUTH  DAILY BEFORE BREAKFAST Patient taking differently: Take 150 mcg by mouth daily before breakfast.  01/15/18  Yes Lars Masson, MD  losartan (COZAAR) 50 MG tablet Take 1 tablet (50 mg total)  by mouth daily. 10/15/18  Yes Lars Masson, MD  montelukast (SINGULAIR) 10 MG tablet TAKE 1 TABLET BY MOUTH AT  BEDTIME Patient taking differently: Take 10 mg by mouth at bedtime.  09/14/18  Yes Nyoka Cowden, MD  OXYGEN Inhale 2 L into the lungs continuous. continuous o2    Yes [provider]  pantoprazole (PROTONIX) 40 MG tablet Take 1 tablet (40 mg total) by mouth daily  before breakfast. 09/14/18  Yes Nyoka Cowden, MD  SYMBICORT 160-4.5 MCG/ACT inhaler USE 2 PUFFS TWO TIMES DAILY Patient taking differently: Inhale 2 puffs into the lungs 2 (two) times daily.  04/01/18  Yes Lars Masson, MD  traMADol (ULTRAM) 50 MG tablet TAKE 1-2 TABLETS EVERY 6 HOURS AS NEEDED FOR PAIN Patient taking differently: Take 50 mg by mouth 2 (two) times daily.  07/15/17  Yes Gordy Savers, MD  triamcinolone cream (KENALOG) 0.1 % Apply 1 application topically 2 (two) times daily as needed (for psorisis).   Yes [provider]     Allergies:    No Known Allergies   Physical Exam:   Vitals  Blood pressure 111/82, pulse 100, temperature 99 F (37.2 C), temperature source Oral, resp. rate (!) 23, SpO2 98 %.   1. General elderly, morbidly obese Caucasian female lying in hospital bed in no apparent discomfort,  2. Normal affect and insight, Not Suicidal or Homicidal, Awake Alert, Oriented X 3.  3. No F.N deficits, ALL C.Nerves Intact, Strength 5/5 all 4 extremities, Sensation intact all 4 extremities, Plantars down going.  4. Ears and Eyes appear Normal, Conjunctivae clear, PERRLA. Moist Oral Mucosa.  5. Supple Neck, No JVD, No cervical lymphadenopathy appriciated, No Carotid Bruits.  6. Symmetrical Chest wall movement, Good air movement bilaterally, bibasilar crackles  7. RRR, No Gallops, Rubs or Murmurs, No Parasternal Heave.  8. Positive Bowel Sounds, Abdomen Soft, No tenderness, No organomegaly appriciated,No rebound -guarding or rigidity.  9.  No Cyanosis,  Normal Skin Turgor, No Skin Rash or Bruise.  10. Good muscle tone,  joints appear normal , no effusions, Normal ROM.  11. No Palpable Lymph Nodes in Neck or Axillae      Data Review:    CBC Recent Labs  Lab 10/28/18 1400  WBC 15.3*  HGB 9.3*  HCT 32.9*  PLT 189  MCV 98.8  MCH 27.9  MCHC 28.3*  RDW 15.0  LYMPHSABS 0.5*  MONOABS 0.6  EOSABS 0.0  BASOSABS 0.0   ------------------------------------------------------------------------------------------------------------------  Chemistries  Recent Labs  Lab 10/28/18 1400  NA 140  K 4.2  CL 94*  CO2 36*  GLUCOSE 121*  BUN 34*  CREATININE 1.28*  CALCIUM 8.9  AST 20  ALT 17  ALKPHOS 44  BILITOT 0.7   ------------------------------------------------------------------------------------------------------------------ estimated creatinine clearance is 41.4 mL/min (A) (by C-G formula based on SCr of 1.28 mg/dL (H)). ------------------------------------------------------------------------------------------------------------------ No results for input(s): TSH, T4TOTAL, T3FREE, THYROIDAB in the last 72 hours.  Invalid input(s): FREET3  Coagulation profile No results for input(s): INR, PROTIME in the last 168 hours. ------------------------------------------------------------------------------------------------------------------- No results for input(s): DDIMER in the last 72 hours. -------------------------------------------------------------------------------------------------------------------  Cardiac Enzymes No results for input(s): CKMB, TROPONINI, MYOGLOBIN in the last 168 hours.  Invalid input(s): CK ------------------------------------------------------------------------------------------------------------------    Component Value Date/Time   BNP 226.5 (H) 10/28/2018 1400   BNP 44.3 03/26/2016 1215      ---------------------------------------------------------------------------------------------------------------  Urinalysis    Component Value Date/Time   COLORURINE YELLOW 05/31/2013 1412   APPEARANCEUR CLEAR 05/31/2013 1412   LABSPEC 1.027 05/31/2013 1412   PHURINE 5.0 05/31/2013 1412   GLUCOSEU NEGATIVE 05/31/2013 1412   HGBUR NEGATIVE 05/31/2013 1412   HGBUR negative 07/18/2010 0941   BILIRUBINUR SMALL (A) 05/31/2013 1412   BILIRUBINUR n 08/10/2012 1131   KETONESUR NEGATIVE 05/31/2013 1412   PROTEINUR 30 (A) 05/31/2013 1412   UROBILINOGEN 0.2 05/31/2013 1412   NITRITE NEGATIVE 05/31/2013 1412   LEUKOCYTESUR NEGATIVE 05/31/2013 1412    ----------------------------------------------------------------------------------------------------------------  Imaging Results:    Dg Chest 2 View  Result Date: 10/28/2018 CLINICAL DATA:  Short of breath, dizziness, bilateral chest pain and cough over the last 2 days EXAM: CHEST - 2 VIEW COMPARISON:  Chest x-ray of 08/31/2017 FINDINGS: The lungs are poorly aerated and there is basilar atelectasis present. There is cardiomegaly present with small effusions and possible pulmonary vascular congestion, which may indicate mild congestive heart failure. Median sternotomy sutures are noted. No bony abnormality is seen. IMPRESSION: Poor inspiration with cardiomegaly, probable small effusions, and minimal congestion may indicate mild CHF. No focal pneumonia. Electronically Signed   By: Dwyane Dee M.D.   On: 10/28/2018 14:50   Ct Angio Chest Pe W And/or Wo Contrast  Result Date: 10/28/2018 CLINICAL DATA:  Chest pain, shortness of breath for 3 days, cough EXAM: CT ANGIOGRAPHY CHEST WITH CONTRAST TECHNIQUE: Multidetector CT imaging of the chest was performed using the standard protocol during bolus administration of intravenous contrast. Multiplanar CT image reconstructions and MIPs were obtained to evaluate the vascular anatomy. CONTRAST:  66mL  ISOVUE-370 IOPAMIDOL (ISOVUE-370) INJECTION 76% COMPARISON:  Chest x-ray of 10/28/2018 and 08/31/2018, and CT chest of 07/23/2015 FINDINGS: Cardiovascular: The pulmonary arteries are moderately well opacified. There is no evidence of acute pulmonary embolism. The thoracic aorta is faintly opacified and moderate thoracic aortic atherosclerosis is present. The heart is mildly enlarged and there are diffuse coronary artery calcifications present. Calcification of of the mitral annulus also is noted. Mediastinum/Nodes: There is significant motion and artifact present making evaluation of the mediastinum difficult. There do appear to be somewhat prominent mediastinal nodes present 1 of the larger nodes is precarinal on image 129 series 7 measuring 15 mm. Also there appear to be right subcarinal nodes present which are difficult to measure. A right paratracheal node on image 95 measures 11 mm. These most likely are reactive nodes. Lungs/Pleura: There is apparent scarring noted anteriorly in both upper lobes which could be related to radiation treatment in the proper clinical setting. However, there are patchy diffuse lung infiltrates greatest in the left lower lobe, anterior inferior left upper lobe, lingula, and right middle lobe most consistent with multifocal pneumonia. Only a tiny amount of fluid is present bilaterally. No suspicious lung nodule is seen. Follow-up after interval treatment is recommended to exclude underlying neoplasm. Upper Abdomen: Multiple streak artifacts overlie the upper abdomen, but no significant abnormality is noted. There does appear to be a small hiatal hernia present. Musculoskeletal: The thoracic vertebrae are in normal alignment with degenerative changes diffusely. There may be mild compression deformity of the anterior superior aspect of T5 vertebral body of uncertain age. Correlate clinically. Review of the MIP images confirms the above findings. IMPRESSION: 1. No evidence of acute  pulmonary embolism. 2. However there are patchy lung opacities bilaterally as described above most consistent with multifocal pneumonia. Recommend follow-up after interval treatment. 3. Cardiomegaly and diffuse coronary artery calcifications. 4. Partial compression of anterior superior aspect of T5 of uncertain age. Correlate clinically. Electronically Signed   By: Dwyane Dee M.D.   On: 10/28/2018 16:10    My personal review of EKG: Rhythm NSR, Rate  94 /min, QTc 452 ms , no Acute ST changes   Assessment & Plan:      1.  Acute on chronic hypoxic respiratory failure due to community-acquired pneumonia.  Will be admitted to the hospital, sputum and blood cultures, IV Levaquin, encouraged to sit up in chair, flutter valve for pulmonary toiletry.  Nebulizer treatments and  increasing home oxygen if needed for supportive care.  2.  COPD.  On 2 L nasal cannula home oxygen.  Stable no acute issues.  Supportive care.  Lives with Dr. Sherene Sires.  3.  ED status post CABG.  No acute issues on medications continued unchanged.  Follows with Dr. Delton See.  4.  Acute on chronic diastolic CHF last known EF 60% on echocardiogram done few months ago.  IV Lasix 60 mg 1 dose thereafter monitor.  5.  Morbid obesity.  Follow with PCP for weight loss.  6.  Dyslipidemia.  Continue home dose statin.  7.  GERD.  Placed on PPI.  8.  Mild ARF.  Single dose Lasix likely due to diastolic CHF decompensation, repeat BMP in the morning.  Avoid other nephrotoxins.  Hold ARB.  9.  Essential hypertension.  Now PRN hydralazine.    DVT Prophylaxis  Lovenox    AM Labs Ordered, also please review Full Orders  Family Communication: Admission, patients condition and plan of care including tests being ordered have been discussed with the patient and daughter who indicate understanding and agree with the plan and Code Status.  Code Status Full  Likely DC to  Home  Condition GUARDED    Consults called: None    Admission  status: Inpt    Time spent in minutes : 35   Susa Raring M.D on 10/28/2018 at 4:55 PM  To page go to [email or web address] - password [facility]

## 2018-10-28 NOTE — ED Provider Notes (Signed)
[facility] Provider Note   CSN: [id number] Arrival date & time: 10/28/18  1306     History   Chief Complaint Chief Complaint  Patient presents with  . Shortness of Breath    HPI [name] is a 72 y.o. female.  HPI  Had a syncopal event 5 days ago and hit right chest 4 days ago developed shortness of breath and right sided chest pain, right side sharp, worse with deep breaths Also notes aching pain to central and left side of chest, does worsen with exertion. Has to stop and rest because of pain. This pain seems different than the right sided pain, is more dull left sided.  Mild cough, not coughing anything up.  No significant increase in wheezing  Past Medical History:  Diagnosis Date  . Acute bronchitis 09/25/2015  . ACUTE ON CHRONIC DIASTOLIC HEART FAILURE 12/04/2010   Qualifier: Diagnosis of  By: Ladona Ridgelaylor, MD, [facility] Center[facility]ACC, Vergia AlconGregg William   . Arthritis    OA RIGHT KNEE WITH PAIN  . Barrett esophagus   . Bradycardia 06/01/2015  . Chronic respiratory failure (HCC)   . Chronic respiratory failure with hypoxia and hypercapnia (HCC) 02/04/2010   Followed in Pulmonary clinic/ [facility]/ [place]       - 02 dependent  since 07/02/10 >>  83% RA December 05, 2010       - ONO RA 08/05/12  :  Positive sat < 89 x 2:1511m> repeat on 2lpm rec 08/12/2012  - 06/17/2013 reported desat with activity p Knee surgery > rec restart 2lpm with activity  - 06/27/2013   Walked 2lpm  x one lap @ 185 stopped due to sat 88% not sob , desat to 82% on RA just at th  . COPD (chronic obstructive pulmonary disease) (HCC)   . COPD III spirometry if use FEV1/VC p saba  07/18/2010   Quit smoking May 2006       - PFT's  04/12/10 FEV1  1.21 (69%) ratio 77 and no change p B2,  DLC0 56%   VC 70%         - PFTs  08/08/2013 FEV1 1.21 (60%) ratio 86 and no change p B2 DLCO 79%  VC 72%  On symbicort 160 2bid  - PFT's  02/08/2018  FEV1 0.70 (40 % ) ratio 56 if use FEV1/VC  p 38 %  improvement from saba p symb 160 prior to study with DLCO  78 % corrects to 147  % for alv volume   - 02/08/2018  . Cough variant asthma 02/26/2011   Followed in Pulmonary clinic/ [facility]/ [place]  - PFT's  06/04/15  FEV1 1.20 (67 % ) ratio 83  p 6 % improvement from saba with DLCO  80 % corrects to 132 % for alv volume      - Clinical dx based on response to symbicort       FENO 09/16/2016  =   96 on symbicort 160 2bid > added singulair  Allergy profile 09/16/2016 >  Eos 0.5 /  IgE  78 neg RAST  -  Referred to rehab 04/29/2017 > completed  . DOE (dyspnea on exertion) 02/19/2016  . Essential hypertension 04/20/2007   Qualifier: Diagnosis of  By: Marcelyn DittyMondi, RN, Katy FitchJudy Ann   . GERD (gastroesophageal reflux disease)   . History of ARDS 2006  . History of home oxygen therapy    AT NIGHT WHEN SLEEPING 2 L / MIN NASAL CANNULA  .  Hyperlipidemia 07/12/2015  . Hypertension   . Hypothyroidism   . Morbid (severe) obesity due to excess calories (HCC) 04/22/2015   pfts with erv 14% 06/04/15  And 33% 02/08/2018   . NSTEMI (non-ST elevated myocardial infarction) (HCC) 05/31/2015  . Pneumococcal pneumonia (HCC) 2006   HOSPITALIZED AND DEVELOPED ARDS  . Psoriatic arthritis (HCC)   . Pulmonary fibrosis (HCC)   . PULMONARY FIBROSIS ILD POST INFLAMMATORY CHRONIC 07/18/2010   Followed as Primary Care Patient/ [facility]/ [place]  -s/p ARDS 2006 with bacteremic S  Pna       - CT chest 07/03/10 Nonspecific PF mostly upper lobes       - CT chest 12/03/10 acute gg changes and effusions c/w chf - PFT's  02/08/2018  FVC 0.64 (28 %)   with DLCO  78 % corrects to 147 % for alv volume     . Rheumatic disease   . 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 SOB (shortness of breath) on exertion     Patient Active Problem List   Diagnosis Date Noted  . CAP (community acquired pneumonia) 10/28/2018  . ICH (intracerebral hemorrhage) (HCC) 08/25/2018  . Hyperlipidemia 07/12/2015  . 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 Bradycardia 06/01/2015  . NSTEMI (non-ST  elevated myocardial infarction) (HCC) 05/31/2015  . Morbid (severe) obesity due to excess calories (HCC) 04/22/2015  . Postoperative anemia due to acute blood loss 06/08/2013  . History of home oxygen therapy 06/07/2013  . GERD (gastroesophageal reflux disease) 06/07/2013  . Barrett's esophagus 06/07/2013  . OA (osteoarthritis) of knee 06/06/2013  . ACUTE ON CHRONIC DIASTOLIC HEART FAILURE 12/04/2010  . COPD III spirometry if use FEV1/VC p saba  07/18/2010  . 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 Compression fracture of thoracic vertebra (HCC) 07/02/2010  . Chronic respiratory failure with hypoxia and hypercapnia (HCC) 02/04/2010  . Hypothyroidism 04/20/2007  . Essential hypertension 04/20/2007  . PSORIATIC ARTHRITIS 04/20/2007    Past Surgical History:  Procedure Laterality Date  . ABDOMINOPLASTY    . CARDIAC CATHETERIZATION N/A 06/01/2015   Procedure: Left Heart Cath and Coronary Angiography;  Surgeon: Runell Gess, MD;  [facility] INVASIVE CV LAB;  Service: Cardiovascular;  Laterality: N/A;  . CARPAL TUNNEL RELEASE    . CHOLECYSTECTOMY    . CORONARY ARTERY BYPASS GRAFT N/A 06/04/2015   Procedure: CORONARY ARTERY BYPASS GRAFT times three            with left internal mammary artery and right leg saphenous vein;  Surgeon: Alleen Borne, MD;  Location: [facility] OR;  Service: Open Heart Surgery;  Laterality: N/A;  . cosmetic breast surgery    . JOINT REPLACEMENT    . KNEE ARTHROSCOPY Left   . TEE WITHOUT CARDIOVERSION  06/04/2015   Procedure: TRANSESOPHAGEAL ECHOCARDIOGRAM (TEE);  Surgeon: Alleen Borne, MD;  Location: [facility] OR;  Service: Open Heart Surgery;;  . TOTAL KNEE ARTHROPLASTY Left   . TOTAL KNEE ARTHROPLASTY Right 06/06/2013   Procedure: RIGHT TOTAL KNEE ARTHROPLASTY;  Surgeon: Loanne Drilling, MD;  Location: [facility] ORS;  Service: Orthopedics;  Laterality: Right;     OB History   No obstetric history on file.      Home Medications    Prior to  Admission medications   Medication Sig Start Date End Date Taking? Authorizing Provider  acetaminophen (TYLENOL ARTHRITIS PAIN) 650 MG CR tablet Take 1,300 mg by mouth every 8 (eight) hours as needed for pain. Per bottle directions  as needed   Yes [provider]  albuterol (PROAIR HFA) 108 (90 Base) MCG/ACT inhaler 2 puffs every 4 hours as needed only  if your can't catch your breath Patient taking differently: Inhale 2 puffs into the lungs every 4 (four) hours as needed for shortness of breath. as needed only  if your can't catch your breath 07/22/16  Yes Nyoka Cowden, MD  atorvastatin (LIPITOR) 40 MG tablet Take 1 tablet (40 mg total) by mouth daily at 6 PM. 10/15/18  Yes Lars Masson, MD  Cholecalciferol (VITAMIN D3) 2000 UNITS TABS Take 2,000 Int'l Units by mouth daily.   Yes [provider]  citalopram (CELEXA) 40 MG tablet TAKE 1 TABLET BY MOUTH  EVERY MORNING Patient taking differently: Take 40 mg by mouth daily.  09/14/18  Yes Roderick Pee, MD  furosemide (LASIX) 20 MG tablet TAKE 1 TABLET BY MOUTH TWO  TIMES DAILY Patient taking differently: Take 40 mg by mouth daily.  03/30/18  Yes Lars Masson, MD  golimumab (SIMPONI ARIA) 50 MG/4ML SOLN injection Inject 50 mg into the vein every 8 (eight) weeks.    Yes [provider]  levothyroxine (SYNTHROID, LEVOTHROID) 150 MCG tablet TAKE 1 TABLET BY MOUTH  DAILY BEFORE BREAKFAST Patient taking differently: Take 150 mcg by mouth daily before breakfast.  01/15/18  Yes Lars Masson, MD  losartan (COZAAR) 50 MG tablet Take 1 tablet (50 mg total) by mouth daily. 10/15/18  Yes Lars Masson, MD  montelukast (SINGULAIR) 10 MG tablet TAKE 1 TABLET BY MOUTH AT  BEDTIME Patient taking differently: Take 10 mg by mouth at bedtime.  09/14/18  Yes Nyoka Cowden, MD  OXYGEN Inhale 2 L into the lungs continuous. continuous o2    Yes [provider]  pantoprazole (PROTONIX) 40 MG tablet Take 1 tablet  (40 mg total) by mouth daily before breakfast. 09/14/18  Yes Nyoka Cowden, MD  SYMBICORT 160-4.5 MCG/ACT inhaler USE 2 PUFFS TWO TIMES DAILY Patient taking differently: Inhale 2 puffs into the lungs 2 (two) times daily.  04/01/18  Yes Lars Masson, MD  traMADol (ULTRAM) 50 MG tablet TAKE 1-2 TABLETS EVERY 6 HOURS AS NEEDED FOR PAIN Patient taking differently: Take 50 mg by mouth 2 (two) times daily.  07/15/17  Yes Gordy Savers, MD  triamcinolone cream (KENALOG) 0.1 % Apply 1 application topically 2 (two) times daily as needed (for psorisis).   Yes [provider]    Family History Family History  Problem Relation Age of Onset  . Breast cancer Mother   . Coronary artery disease Father   . Rheum arthritis Father     Social History Social History   Tobacco Use  . Smoking status: Former Smoker    Packs/day: 1.50    Years: 58.50    Pack years: 87.75    Types: Cigarettes    Last attempt to quit: 03/03/2005    Years since quitting: 13.6  . Smokeless tobacco: Never Used  Substance Use Topics  . Alcohol use: No  . Drug use: No     Allergies   Patient has no known allergies.   Review of Systems Review of Systems  Constitutional: Positive for fatigue. Negative for fever.  HENT: Negative for sore throat.   Eyes: Negative for visual disturbance.  Respiratory: Positive for cough and shortness of breath.   Cardiovascular: Positive for chest pain.  Gastrointestinal: Negative for abdominal pain, nausea and vomiting.  Genitourinary: Negative for  difficulty urinating.  Musculoskeletal: Negative for back pain and neck pain.  Skin: Positive for color change (bruising to back of left leg and to left side). Negative for rash.  Neurological: Negative for syncope and headaches.     Physical Exam Updated Vital Signs BP 111/82   Pulse 100   Temp 99 F (37.2 C) (Oral)   Resp (!) 23   SpO2 98%   Physical Exam Vitals signs and nursing note reviewed.    Constitutional:      General: She is not in acute distress.    Appearance: She is well-developed. She is not diaphoretic.  HENT:     Head: Normocephalic and atraumatic.  Eyes:     Conjunctiva/sclera: Conjunctivae normal.  Neck:     Musculoskeletal: Normal range of motion.  Cardiovascular:     Rate and Rhythm: Normal rate and regular rhythm.     Heart sounds: Normal heart sounds. No murmur. No friction rub. No gallop.   Pulmonary:     Effort: Pulmonary effort is normal. No respiratory distress.     Breath sounds: Rales (bilateral) present. No wheezing.  Chest:     Chest wall: Tenderness (right lower chest wall) present.  Abdominal:     General: There is no distension.     Palpations: Abdomen is soft.     Tenderness: There is no abdominal tenderness. There is no guarding.  Musculoskeletal:        General: No tenderness.     Comments: Contusion back of left leg  Skin:    General: Skin is warm and dry.     Findings: No erythema or rash.  Neurological:     Mental Status: She is alert and oriented to person, place, and time.      ED Treatments / Results  Labs (all labs ordered are listed, but only abnormal results are displayed) Labs Reviewed  CBC WITH DIFFERENTIAL/PLATELET - Abnormal; Notable for the following components:      Result Value   WBC 15.3 (*)    RBC 3.33 (*)    Hemoglobin 9.3 (*)    HCT 32.9 (*)    MCHC 28.3 (*)    Neutro Abs 14.2 (*)    Lymphs Abs 0.5 (*)    All other components within normal limits  COMPREHENSIVE METABOLIC PANEL - Abnormal; Notable for the following components:   Chloride 94 (*)    CO2 36 (*)    Glucose, Bld 121 (*)    BUN 34 (*)    Creatinine, Ser 1.28 (*)    Albumin 3.0 (*)    GFR calc non Af Amer 42 (*)    GFR calc Af Amer 49 (*)    All other components within normal limits  BRAIN NATRIURETIC PEPTIDE - Abnormal; Notable for the following components:   B Natriuretic Peptide 226.5 (*)    All other components within normal limits   EXPECTORATED SPUTUM ASSESSMENT W REFEX TO RESP CULTURE  CULTURE, BLOOD (ROUTINE X 2)  CULTURE, BLOOD (ROUTINE X 2)  BASIC METABOLIC PANEL  CBC  I-STAT TROPONIN, ED    EKG EKG Interpretation  Date/Time:  Thursday October 28 2018 13:13:03 EST Ventricular Rate:  94 PR Interval:  158 QRS Duration: 90 QT Interval:  362 QTC Calculation: 452 R Axis:   80 Text Interpretation:  Sinus rhythm with Premature atrial complexes Otherwise normal ECG No significant change since last tracing Confirmed by Alvira Monday ([id number]) on 10/28/2018 3:40:35 PM   Radiology Dg Chest 2 View  Result Date: 10/28/2018 CLINICAL DATA:  Short of breath, dizziness, bilateral chest pain and cough over the last 2 days EXAM: CHEST - 2 VIEW COMPARISON:  Chest x-ray of 08/31/2017 FINDINGS: The lungs are poorly aerated and there is basilar atelectasis present. There is cardiomegaly present with small effusions and possible pulmonary vascular congestion, which may indicate mild congestive heart failure. Median sternotomy sutures are noted. No bony abnormality is seen. IMPRESSION: Poor inspiration with cardiomegaly, probable small effusions, and minimal congestion may indicate mild CHF. No focal pneumonia. Electronically Signed   By: Dwyane Dee M.D.   On: 10/28/2018 14:50   Ct Angio Chest Pe W And/or Wo Contrast  Result Date: 10/28/2018 CLINICAL DATA:  Chest pain, shortness of breath for 3 days, cough EXAM: CT ANGIOGRAPHY CHEST WITH CONTRAST TECHNIQUE: Multidetector CT imaging of the chest was performed using the standard protocol during bolus administration of intravenous contrast. Multiplanar CT image reconstructions and MIPs were obtained to evaluate the vascular anatomy. CONTRAST:  59mL ISOVUE-370 IOPAMIDOL (ISOVUE-370) INJECTION 76% COMPARISON:  Chest x-ray of 10/28/2018 and 08/31/2018, and CT chest of 07/23/2015 FINDINGS: Cardiovascular: The pulmonary arteries are moderately well opacified. There is no evidence of  acute pulmonary embolism. The thoracic aorta is faintly opacified and moderate thoracic aortic atherosclerosis is present. The heart is mildly enlarged and there are diffuse coronary artery calcifications present. Calcification of of the mitral annulus also is noted. Mediastinum/Nodes: There is significant motion and artifact present making evaluation of the mediastinum difficult. There do appear to be somewhat prominent mediastinal nodes present 1 of the larger nodes is precarinal on image 129 series 7 measuring 15 mm. Also there appear to be right subcarinal nodes present which are difficult to measure. A right paratracheal node on image 95 measures 11 mm. These most likely are reactive nodes. Lungs/Pleura: There is apparent scarring noted anteriorly in both upper lobes which could be related to radiation treatment in the proper clinical setting. However, there are patchy diffuse lung infiltrates greatest in the left lower lobe, anterior inferior left upper lobe, lingula, and right middle lobe most consistent with multifocal pneumonia. Only a tiny amount of fluid is present bilaterally. No suspicious lung nodule is seen. Follow-up after interval treatment is recommended to exclude underlying neoplasm. Upper Abdomen: Multiple streak artifacts overlie the upper abdomen, but no significant abnormality is noted. There does appear to be a small hiatal hernia present. Musculoskeletal: The thoracic vertebrae are in normal alignment with degenerative changes diffusely. There may be mild compression deformity of the anterior superior aspect of T5 vertebral body of uncertain age. Correlate clinically. Review of the MIP images confirms the above findings. IMPRESSION: 1. No evidence of acute pulmonary embolism. 2. However there are patchy lung opacities bilaterally as described above most consistent with multifocal pneumonia. Recommend follow-up after interval treatment. 3. Cardiomegaly and diffuse coronary artery  calcifications. 4. Partial compression of anterior superior aspect of T5 of uncertain age. Correlate clinically. Electronically Signed   By: Dwyane Dee M.D.   On: 10/28/2018 16:10    Procedures Procedures (including critical care time)  Medications Ordered in ED Medications  iopamidol (ISOVUE-370) 76 % injection (has no administration in time range)  atorvastatin (LIPITOR) tablet 40 mg (40 mg Oral Given 10/28/18 1838)  levothyroxine (SYNTHROID, LEVOTHROID) tablet 150 mcg (has no administration in time range)  mometasone-formoterol (DULERA) 200-5 MCG/ACT inhaler 2 puff (has no administration in time range)  traMADol (ULTRAM) tablet 50 mg (has no administration in time range)  pantoprazole (PROTONIX) EC  tablet 40 mg (has no administration in time range)  montelukast (SINGULAIR) tablet 10 mg (has no administration in time range)  ipratropium-albuterol (DUONEB) 0.5-2.5 (3) MG/3ML nebulizer solution 3 mL (3 mLs Nebulization Given 10/28/18 1702)  enoxaparin (LOVENOX) injection 40 mg (has no administration in time range)  sodium chloride flush (NS) 0.9 % injection 3 mL (has no administration in time range)  sodium chloride flush (NS) 0.9 % injection 3 mL (has no administration in time range)  0.9 %  sodium chloride infusion (has no administration in time range)  acetaminophen (TYLENOL) tablet 650 mg (has no administration in time range)    Or  acetaminophen (TYLENOL) suppository 650 mg (has no administration in time range)  senna-docusate (Senokot-S) tablet 1 tablet (has no administration in time range)  levofloxacin (LEVAQUIN) IVPB 750 mg (has no administration in time range)  iopamidol (ISOVUE-370) 76 % injection 75 mL (75 mLs Intravenous Contrast Given 10/28/18 1539)  levofloxacin (LEVAQUIN) IVPB 750 mg (0 mg Intravenous Stopped 10/28/18 1840)  furosemide (LASIX) injection 60 mg (60 mg Intravenous Given 10/28/18 1839)     Initial Impression / Assessment and Plan / ED Course  I have  reviewed the triage vital signs and the nursing notes.  Pertinent labs & imaging results that were available during my care of the patient were reviewed by me and considered in my medical decision making (see chart for details).     72 year old female with history of chronic respiratory failure with hypoxia and hypercapnia with COPD, history of pulmonary fibrosis status post ARDS on 2 L of home oxygen, history of coronary artery disease with history of CABG, hypertension, hyperlipidemia who presents with concern for chest pain and shortness of breath, as well as suspected syncopal episode preceding symptoms.  Differential diagnosis includes pulmonary embolus with syncope, followed by chest pain and dyspnea, rib fracture, pneumothorax, pneumonia, congestive heart failure, or cardiac etiology of pain.  Chest x-ray does not show signs of pneumothorax, fluid overload, or pneumonia.  History and physical are not consistent with CHF.  Troponin negative.   CT shows no sign of PE but does show patchy lung opacities, most consistent with multifocal pneumonia.  Consulted hospitalist for admission and hospitalist placing abx order. Will admit for pneumonia, increased O2 requirement.   Final Clinical Impressions(s) / ED Diagnoses   Final diagnoses:  Multifocal pneumonia  Chest pain, unspecified type    ED Discharge Orders    None       Alvira Monday, MD 10/28/18 1919

## 2018-10-28 NOTE — ED Notes (Addendum)
Pt from home due to right sided pain under the right breast, dizziness, shob and cough x 3 days. Pt fell 3 days ago after getting dizzy and landed on her right side. Spo2 88% on 2L [place] in triage, brought back to [facility]US room. Speaking in complete sentences but appears to be shob. Hx of CHF and COPD. Takes lasix and complains of lower leg swelling that is worse than normal.

## 2018-10-28 NOTE — Progress Notes (Signed)
Pt admitted from ED to [facility] room 13. A&O x4. Skin intact. Tele monitor placed. Call bell within reach. Daughter at bedside. All questions/concerns addressed. Will continue to monitor.

## 2018-10-29 DIAGNOSIS — R079 Chest pain, unspecified: Secondary | ICD-10-CM

## 2018-10-29 LAB — CBC
HCT: 29.5 % — ABNORMAL LOW (ref 36.0–46.0)
Hemoglobin: 8.6 g/dL — ABNORMAL LOW (ref 12.0–15.0)
MCH: 28.7 pg (ref 26.0–34.0)
MCHC: 29.2 g/dL — ABNORMAL LOW (ref 30.0–36.0)
MCV: 98.3 fL (ref 80.0–100.0)
Platelets: 190 10*3/uL (ref 150–400)
RBC: 3 MIL/uL — ABNORMAL LOW (ref 3.87–5.11)
RDW: 15.4 % (ref 11.5–15.5)
WBC: 14.6 10*3/uL — ABNORMAL HIGH (ref 4.0–10.5)
nRBC: 0 % (ref 0.0–0.2)

## 2018-10-29 LAB — BASIC METABOLIC PANEL
Anion gap: 10 (ref 5–15)
BUN: 38 mg/dL — ABNORMAL HIGH (ref 8–23)
CO2: 36 mmol/L — ABNORMAL HIGH (ref 22–32)
Calcium: 8.4 mg/dL — ABNORMAL LOW (ref 8.9–10.3)
Chloride: 90 mmol/L — ABNORMAL LOW (ref 98–111)
Creatinine, Ser: 1.39 mg/dL — ABNORMAL HIGH (ref 0.44–1.00)
GFR calc Af Amer: 44 mL/min — ABNORMAL LOW (ref >60)
GFR calc non Af Amer: 38 mL/min — ABNORMAL LOW (ref >60)
Glucose, Bld: 134 mg/dL — ABNORMAL HIGH (ref 70–99)
Potassium: 3.6 mmol/L (ref 3.5–5.1)
Sodium: 136 mmol/L (ref 135–145)

## 2018-10-29 MED ORDER — IPRATROPIUM-ALBUTEROL 0.5-2.5 (3) MG/3ML IN SOLN
3.0000 mL | Freq: Three times a day (TID) | RESPIRATORY_TRACT | Status: DC
  Administered 2018-10-29 – 2018-10-30 (×3): 3 mL via RESPIRATORY_TRACT
  Filled 2018-10-29 (×3): qty 3

## 2018-10-29 MED ORDER — SODIUM CHLORIDE 0.9 % IV SOLN
INTRAVENOUS | Status: AC
  Administered 2018-10-29: 12:00:00 via INTRAVENOUS

## 2018-10-29 NOTE — Progress Notes (Signed)
PROGRESS NOTE                                                                                                                                                                                                             Patient Demographics:    [name], is a 72 y.o. female, DOB - 1946/01/20, DJS:[id number]  Admit date - 10/28/2018   Admitting Physician Leroy Sea, MD  Outpatient Primary MD for the patient is [name], [name], MD  LOS - 1  Chief Complaint  Patient presents with  . Shortness of Breath       Brief Narrative   [name]  is a 72 y.o. female, history of COPD on home oxygen 2 L nasal cannula per minute, CAD status post CABG in 2016, dyslipidemia, GERD, hypertension, hypothyroidism, morbid obesity.  And with present history comes with 2 to 3-day history of cough and progressive shortness of breath, does have baseline orthopnea and no change in that pattern, denies any fever or exposure to sick contacts, symptoms worse with exertion better with rest.  She does say that about 7 to 10 days ago she became little short of breath and wheezy at that point she saw her pulmonologist and was put on prednisone without much benefit.  For the last 2 days she has developed a dry cough with progressively worsening shortness of breath.  No chest pain or palpitations, in the ER work-up consistent with community-acquired pneumonia and I was requested to admit the patient.   Subjective:    [name] today has, No headache, No chest pain, No abdominal pain - No Nausea, No new weakness tingling or numbness, No Cough - SOB.     Assessment  & Plan :     1.  Acute on chronic hypoxic respiratory failure due to community-acquired pneumonia.    Admitted to the hospital undergoing IV Levaquin with much clinical improvement, shortness of breath has improved and cough is improved as well, encouraged to sit up in chair and use flutter valve for pulmonary toiletry, already  on oxygen which he uses at home, monitor with supportive care.  2.  COPD.  On 2 L nasal cannula home oxygen.  Stable no acute issues.  Supportive care.  Lives with Dr. Sherene Sires.  3.  ED status post CABG.  No acute issues on medications continued unchanged.  Follows with Dr. Delton See.  4.  Acute on chronic diastolic CHF last known EF 60% on echocardiogram done few months  ago.  Now compensated after single dose Lasix on 10/28/2018.  5.  Morbid obesity.  Follow with PCP for weight loss.  6.  Dyslipidemia.  Continue home dose statin.  7.  GERD.  Placed on PPI.  8.  Mild ARF.  Worse after Lasix, appears mildly dehydrated today, no rales on exam, gentle hydration for 24 hours and repeat BMP in the morning.  9.  Essential hypertension.  Now PRN hydralazine.    Family Communication  :  Daughter  Code Status :  Full  Disposition Plan  :  Home 1-2 days  Consults  :  None  Procedures  :    CTA  -  1. No evidence of acute pulmonary embolism. 2. However there are patchy lung opacities bilaterally as described above most consistent with multifocal pneumonia. Recommend follow-up after interval treatment. 3. Cardiomegaly and diffuse coronary artery calcifications. 4. Partial compression of anterior superior aspect of T5 of uncertain age.   DVT Prophylaxis  :  Lovenox   Lab Results  Component Value Date   PLT 190 10/29/2018    Diet :  Diet Order            Diet Heart Room service appropriate? Yes; Fluid consistency: Thin  Diet effective now               Inpatient Medications Scheduled Meds: . atorvastatin  40 mg Oral q1800  . enoxaparin (LOVENOX) injection  40 mg Subcutaneous Q24H  . ipratropium-albuterol  3 mL Nebulization Q6H  . levothyroxine  150 mcg Oral QAC breakfast  . mometasone-formoterol  2 puff Inhalation BID  . montelukast  10 mg Oral QHS  . pantoprazole  40 mg Oral QAC breakfast   Continuous Infusions: . sodium chloride    . [START ON 10/30/2018]  levofloxacin (LEVAQUIN) IV     PRN Meds:.acetaminophen **OR** [DISCONTINUED] acetaminophen, senna-docusate, traMADol  Antibiotics  :   Anti-infectives (From admission, onward)   Start     Dose/Rate Route Frequency Ordered Stop   10/30/18 1800  levofloxacin (LEVAQUIN) IVPB 750 mg     750 mg 100 mL/hr over 90 Minutes Intravenous Every 48 hours 10/28/18 1701     10/28/18 1700  levofloxacin (LEVAQUIN) IVPB 750 mg     750 mg 100 mL/hr over 90 Minutes Intravenous  Once 10/28/18 1640 10/28/18 1840          Objective:   Vitals:   10/29/18 0408 10/29/18 0643 10/29/18 0857 10/29/18 0902  BP:  (!) 119/55    Pulse:  97    Resp:  18    Temp:  99.1 F (37.3 C)    TempSrc:  Oral    SpO2: 93% 90% 100% 100%    Wt Readings from Last 3 Encounters:  10/22/18 97.7 kg  10/11/18 94.3 kg  10/06/18 93 kg    No intake or output data in the 24 hours ending 10/29/18 0957   Physical Exam  Awake Alert, Oriented X 3, No new F.N deficits, Normal affect [place].AT,PERRAL Supple Neck,No JVD, No cervical lymphadenopathy appriciated.  Symmetrical Chest wall movement, Good air movement bilaterally, CTAB RRR,No Gallops,Rubs or new Murmurs, No Parasternal Heave +ve B.Sounds, Abd Soft, No tenderness, No organomegaly appriciated, No rebound - guarding or rigidity. No Cyanosis, Clubbing or edema, No new Rash or bruise       Data Review:    CBC Recent Labs  Lab 10/28/18 1400 10/29/18 0257  WBC 15.3* 14.6*  HGB 9.3* 8.6*  HCT 32.9* 29.5*  PLT 189 190  MCV 98.8 98.3  MCH 27.9 28.7  MCHC 28.3* 29.2*  RDW 15.0 15.4  LYMPHSABS 0.5*  --   MONOABS 0.6  --   EOSABS 0.0  --   BASOSABS 0.0  --     Chemistries  Recent Labs  Lab 10/28/18 1400 10/29/18 0257  NA 140 136  K 4.2 3.6  CL 94* 90*  CO2 36* 36*  GLUCOSE 121* 134*  BUN 34* 38*  CREATININE 1.28* 1.39*  CALCIUM 8.9 8.4*  AST 20  --   ALT 17  --   ALKPHOS 44  --   BILITOT 0.7  --     ------------------------------------------------------------------------------------------------------------------ No results for input(s): CHOL, HDL, LDLCALC, TRIG, CHOLHDL, LDLDIRECT in the last 72 hours.  Lab Results  Component Value Date   HGBA1C 5.3 08/25/2018   ------------------------------------------------------------------------------------------------------------------ No results for input(s): TSH, T4TOTAL, T3FREE, THYROIDAB in the last 72 hours.  Invalid input(s): FREET3 ------------------------------------------------------------------------------------------------------------------ No results for input(s): VITAMINB12, FOLATE, FERRITIN, TIBC, IRON, RETICCTPCT in the last 72 hours.  Coagulation profile No results for input(s): INR, PROTIME in the last 168 hours.  No results for input(s): DDIMER in the last 72 hours.  Cardiac Enzymes No results for input(s): CKMB, TROPONINI, MYOGLOBIN in the last 168 hours.  Invalid input(s): CK ------------------------------------------------------------------------------------------------------------------    Component Value Date/Time   BNP 226.5 (H) 10/28/2018 1400   BNP 44.3 03/26/2016 1215    Micro Results No results found for this or any previous visit (from the past 240 hour(s)).  Radiology Reports Dg Chest 2 View  Result Date: 10/28/2018 CLINICAL DATA:  Short of breath, dizziness, bilateral chest pain and cough over the last 2 days EXAM: CHEST - 2 VIEW COMPARISON:  Chest x-ray of 08/31/2017 FINDINGS: The lungs are poorly aerated and there is basilar atelectasis present. There is cardiomegaly present with small effusions and possible pulmonary vascular congestion, which may indicate mild congestive heart failure. Median sternotomy sutures are noted. No bony abnormality is seen. IMPRESSION: Poor inspiration with cardiomegaly, probable small effusions, and minimal congestion may indicate mild CHF. No focal pneumonia.  Electronically Signed   By: Dwyane Dee M.D.   On: 10/28/2018 14:50   Ct Angio Chest Pe W And/or Wo Contrast  Result Date: 10/28/2018 CLINICAL DATA:  Chest pain, shortness of breath for 3 days, cough EXAM: CT ANGIOGRAPHY CHEST WITH CONTRAST TECHNIQUE: Multidetector CT imaging of the chest was performed using the standard protocol during bolus administration of intravenous contrast. Multiplanar CT image reconstructions and MIPs were obtained to evaluate the vascular anatomy. CONTRAST:  19mL ISOVUE-370 IOPAMIDOL (ISOVUE-370) INJECTION 76% COMPARISON:  Chest x-ray of 10/28/2018 and 08/31/2018, and CT chest of 07/23/2015 FINDINGS: Cardiovascular: The pulmonary arteries are moderately well opacified. There is no evidence of acute pulmonary embolism. The thoracic aorta is faintly opacified and moderate thoracic aortic atherosclerosis is present. The heart is mildly enlarged and there are diffuse coronary artery calcifications present. Calcification of of the mitral annulus also is noted. Mediastinum/Nodes: There is significant motion and artifact present making evaluation of the mediastinum difficult. There do appear to be somewhat prominent mediastinal nodes present 1 of the larger nodes is precarinal on image 129 series 7 measuring 15 mm. Also there appear to be right subcarinal nodes present which are difficult to measure. A right paratracheal node on image 95 measures 11 mm. These most likely are reactive nodes. Lungs/Pleura: There is apparent scarring noted anteriorly in both upper lobes which could be related to radiation treatment in  the proper clinical setting. However, there are patchy diffuse lung infiltrates greatest in the left lower lobe, anterior inferior left upper lobe, lingula, and right middle lobe most consistent with multifocal pneumonia. Only a tiny amount of fluid is present bilaterally. No suspicious lung nodule is seen. Follow-up after interval treatment is recommended to exclude underlying  neoplasm. Upper Abdomen: Multiple streak artifacts overlie the upper abdomen, but no significant abnormality is noted. There does appear to be a small hiatal hernia present. Musculoskeletal: The thoracic vertebrae are in normal alignment with degenerative changes diffusely. There may be mild compression deformity of the anterior superior aspect of T5 vertebral body of uncertain age. Correlate clinically. Review of the MIP images confirms the above findings. IMPRESSION: 1. No evidence of acute pulmonary embolism. 2. However there are patchy lung opacities bilaterally as described above most consistent with multifocal pneumonia. Recommend follow-up after interval treatment. 3. Cardiomegaly and diffuse coronary artery calcifications. 4. Partial compression of anterior superior aspect of T5 of uncertain age. Correlate clinically. Electronically Signed   By: Dwyane Dee M.D.   On: 10/28/2018 16:10    Time Spent in minutes  30   Susa Raring M.D on 10/29/2018 at 9:57 AM  To page go to [email or web address] - password [facility]

## 2018-10-29 NOTE — Evaluation (Signed)
Physical Therapy Evaluation Patient Details Name: [name] MRN: [id number] DOB: Aug 11, 1946 Today's Date: 10/29/2018   History of Present Illness  Patient is a 72 y/o female presenting to the ED On 10/28/18 with primary complaints of SOB. Admitted for Acute on chronic hypoxic respiratory failure due to community-acquired pneumonia. PMH significant for COPD on home oxygen 2 L nasal cannula per minute, CAD status post CABG in 2016, dyslipidemia, GERD, hypertension, hypothyroidism, morbid obesity.    Clinical Impression  [name] is a very pleasant 72 y/o female admitted with the above listed diagnosis. Patient reports that prior to admission she was Mod I with all mobility and ADLs. Patient today requiring Min guard for all mobility with patient primarily limited due to cardiopulmonary status - requires 3L O2 via [place] for gait/transfers with RW - desat to 86-87% requiring rest breaks and cueing for breathing patterns. Patient also with reports of LE shakiness/buckling sensation with increased anxiety/fear of falling with mobility. PT to recommend HHPT at discharge to continue to progress safe and independent functional mobility in the home environment. PT to follow acutely.     Follow Up Recommendations Home health PT;Supervision - Intermittent    Equipment Recommendations  None recommended by PT    Recommendations for Other Services       Precautions / Restrictions Precautions Precautions: Fall Restrictions Weight Bearing Restrictions: No      Mobility  Bed Mobility               General bed mobility comments: OOB in chair  Transfers Overall transfer level: Needs assistance Equipment used: Rolling walker (2 wheeled) Transfers: Sit to/from Stand;Stand Pivot Transfers Sit to Stand: Min guard Stand pivot transfers: Min guard       General transfer comment: min guard for safety - no LOB  Ambulation/Gait Ambulation/Gait assistance: Min guard Gait Distance  (Feet): 180 Feet(180 total - multiple standing rst breaks required due to SOB) Assistive device: Rolling walker (2 wheeled) Gait Pattern/deviations: Step-through pattern;Decreased stride length;Trunk flexed;Drifts right/left Gait velocity: decreased   General Gait Details: slow, steady pace of gait - cueing for breathing to ensure maintanence of SpO2 on 3L via [place]  Stairs            Wheelchair Mobility    Modified Rankin (Stroke Patients Only)       Balance Overall balance assessment: Mild deficits observed, not formally tested                                           Pertinent Vitals/Pain Pain Assessment: No/denies pain    Home Living Family/patient expects to be discharged to:: Private residence Living Arrangements: Alone Available Help at Discharge: Family;Available PRN/intermittently Type of Home: House Home Access: Stairs to enter Entrance Stairs-Rails: Right;Left;Can reach both Entrance Stairs-Number of Steps: 5 Home Layout: One level Home Equipment: Shower seat;Bedside commode;Walker - 4 wheels;Walker - 2 wheels      Prior Function Level of Independence: Independent         Comments: 2L O2 at all time     Hand Dominance   Dominant Hand: Right    Extremity/Trunk Assessment   Upper Extremity Assessment Upper Extremity Assessment: Defer to OT evaluation         Cervical / Trunk Assessment Cervical / Trunk Assessment: Normal  Communication   Communication: No difficulties  Cognition Arousal/Alertness: Awake/alert Behavior During Therapy: [facility]  for tasks assessed/performed Overall Cognitive Status: Within Functional Limits for tasks assessed                                        General Comments General comments (skin integrity, edema, etc.): patient reporting "shakiness/buckling" sensation at knees - no true buckle or LOB    Exercises     Assessment/Plan    PT Assessment Patient needs continued PT  services  PT Problem List Decreased strength;Decreased activity tolerance;Decreased mobility;Decreased balance;Decreased safety awareness       PT Treatment Interventions DME instruction;Gait training;Stair training;Functional mobility training;Therapeutic exercise;Therapeutic activities;Balance training;Patient/family education    PT Goals (Current goals can be found in the Care Plan section)  Acute Rehab PT Goals Patient Stated Goal: return home asap PT Goal Formulation: With patient Time For Goal Achievement: 11/12/18 Potential to Achieve Goals: Good    Frequency Min 3X/week   Barriers to discharge        Co-evaluation               AM-PAC PT "6 Clicks" Mobility  Outcome Measure Help needed turning from your back to your side while in a flat bed without using bedrails?: A Little Help needed moving from lying on your back to sitting on the side of a flat bed without using bedrails?: A Little Help needed moving to and from a bed to a chair (including a wheelchair)?: A Little Help needed standing up from a chair using your arms (e.g., wheelchair or bedside chair)?: A Little Help needed to walk in hospital room?: A Little Help needed climbing 3-5 steps with a railing? : A Lot 6 Click Score: 17    End of Session Equipment Utilized During Treatment: Gait belt;Oxygen Activity Tolerance: Patient tolerated treatment well Patient left: in bed;with call bell/phone within reach;with family/visitor present Nurse Communication: Mobility status PT Visit Diagnosis: Unsteadiness on feet (R26.81);Other abnormalities of gait and mobility (R26.89);Muscle weakness (generalized) (M62.81)    Time: 4665-9935 PT Time Calculation (min) (ACUTE ONLY): 41 min   Charges:   PT Evaluation $PT Eval Moderate Complexity: 1 Mod PT Treatments $Gait Training: 8-22 mins        Kipp Laurence, PT, DPT Supplemental Physical Therapist 10/29/18 1:58 PM Pager: [phone] Office:  [phone]

## 2018-10-30 ENCOUNTER — Inpatient Hospital Stay (HOSPITAL_COMMUNITY): Payer: Medicare Other

## 2018-10-30 LAB — BASIC METABOLIC PANEL
Anion gap: 7 (ref 5–15)
BUN: 32 mg/dL — ABNORMAL HIGH (ref 8–23)
CO2: 37 mmol/L — ABNORMAL HIGH (ref 22–32)
Calcium: 8.9 mg/dL (ref 8.9–10.3)
Chloride: 95 mmol/L — ABNORMAL LOW (ref 98–111)
Creatinine, Ser: 1.12 mg/dL — ABNORMAL HIGH (ref 0.44–1.00)
GFR calc Af Amer: 57 mL/min — ABNORMAL LOW (ref >60)
GFR calc non Af Amer: 49 mL/min — ABNORMAL LOW (ref >60)
Glucose, Bld: 130 mg/dL — ABNORMAL HIGH (ref 70–99)
Potassium: 4 mmol/L (ref 3.5–5.1)
Sodium: 139 mmol/L (ref 135–145)

## 2018-10-30 LAB — CBC
HCT: 30.9 % — ABNORMAL LOW (ref 36.0–46.0)
Hemoglobin: 8.8 g/dL — ABNORMAL LOW (ref 12.0–15.0)
MCH: 27.9 pg (ref 26.0–34.0)
MCHC: 28.5 g/dL — ABNORMAL LOW (ref 30.0–36.0)
MCV: 98.1 fL (ref 80.0–100.0)
Platelets: 206 10*3/uL (ref 150–400)
RBC: 3.15 MIL/uL — ABNORMAL LOW (ref 3.87–5.11)
RDW: 15.1 % (ref 11.5–15.5)
WBC: 10.4 10*3/uL (ref 4.0–10.5)
nRBC: 0 % (ref 0.0–0.2)

## 2018-10-30 MED ORDER — FUROSEMIDE 40 MG PO TABS
60.0000 mg | ORAL_TABLET | Freq: Once | ORAL | Status: AC
  Administered 2018-10-30: 60 mg via ORAL
  Filled 2018-10-30: qty 1

## 2018-10-30 MED ORDER — LEVOFLOXACIN 750 MG PO TABS: 750.0000 mg | ORAL_TABLET | Freq: Every day | ORAL | 0 refills | Status: DC

## 2018-10-30 NOTE — Discharge Instructions (Signed)
Follow with Primary MD [name], Amber Patricia, MD in 7 days   Get CBC, CMP, 2 view Chest X ray -  checked  by Primary MD  in 5-7 days   Activity: As tolerated with Full fall precautions use walker/cane & assistance as needed  Disposition Home    Diet: Heart Healthy   Special Instructions: If you have smoked or chewed Tobacco  in the last 2 yrs please stop smoking, stop any regular Alcohol  and or any Recreational drug use.  On your next visit with your primary care physician please Get Medicines reviewed and adjusted.  Please request your Prim.MD to go over all Hospital Tests and Procedure/Radiological results at the follow up, please get all Hospital records sent to your Prim MD by signing hospital release before you go home.  If you experience worsening of your admission symptoms, develop shortness of breath, life threatening emergency, suicidal or homicidal thoughts you must seek medical attention immediately by calling 911 or calling your MD immediately  if symptoms less severe.  You Must read complete instructions/literature along with all the possible adverse reactions/side effects for all the Medicines you take and that have been prescribed to you. Take any new Medicines after you have completely understood and accpet all the possible adverse reactions/side effects.

## 2018-10-30 NOTE — Care Management Note (Signed)
Case Management Note  Patient Details  Name: [name] MRN: [id number] Date of Birth: 1946-10-31  Subjective/Objective:                    Action/Plan:  Spoke w patient at bedside. She would like to use [facility] as she has used them in the past. Referral placed to [facility]. Patient states she has home O2 and has portable tank for transport home.  No other CM needs identified.   Expected Discharge Date:  10/30/18               Expected Discharge Plan:  Home w Home Health Services  In-House Referral:     Discharge planning Services  CM Consult  Post Acute Care Choice:  Home Health Choice offered to:  Patient  DME Arranged:    DME Agency:     HH Arranged:  PT, RN HH Agency:  [facility]  Status of Service:  Completed, signed off  If discussed at [employer], dates discussed:    Additional Comments:  Lawerance Sabal, RN 10/30/2018, 10:50 AM

## 2018-10-30 NOTE — Discharge Summary (Signed)
[name] VHQ:[id number] DOB: 02/20/1946 DOA: 10/28/2018  PCP: Amber AspenHernandez Acosta, Amber PatriciaEstela Y, MD  Admit date: 10/28/2018  Discharge date: 10/30/2018  Admitted From: Home   Disposition:  Home   Recommendations for Outpatient Follow-up:   Follow up with PCP in 1-2 weeks  PCP Please obtain BMP/CBC, 2 view CXR in 1week,  (see Discharge instructions)   PCP Please follow up on the following pending results:    Home Health: None   Equipment/Devices: None  Consultations: None Discharge Condition: Stable   CODE STATUS: Full   Diet Recommendation: Heart Healthy      Chief Complaint  Patient presents with  . Shortness of Breath     Brief history of present illness from the day of admission and additional interim summary    TulaCollinsis a72 Huff.o.female,history of COPD on home oxygen 2 L nasal cannula per minute, CAD status post CABG in 2016, dyslipidemia, GERD, hypertension, hypothyroidism, morbid obesity.And with present history comes with 2 to 3-day history of cough and progressive shortness of breath, does have baseline orthopnea and no change in that pattern, denies any fever or exposure to sick contacts,symptoms worse with exertion better with rest. She does say that about 7 to 10 days ago she became little short of breath and wheezy at that point she saw her pulmonologist and was put on prednisone without much benefit. For the last 2 days she has developed a dry cough with progressively worsening shortness of breath. No chest pain or palpitations, in the ER work-up consistent with community-acquired pneumonia and I was requested to admit the patient.                                                                 Hospital Course    1.Acute on chronic hypoxic respiratory failure due to  community-acquired pneumonia.  was admitted to the hospital and treated with empiric Levaquin with good effect, now close to baseline, shortness of breath has improved and cough is improved as well, encouraged to sit up in chair and use flutter valve for pulmonary toiletry, she is already on 2 L nasal cannula home oxygen and now stable at that.  She is eager to be discharged home she will be placed on 4 more days of oral Levaquin and discharged, requested her to take flutter valve home and continue using it 2-3 times every hour sitting up in chair for pulmonary toiletry, requested to follow with PCP in a week for repeat CBC and two-view chest x-ray.  2.COPD. On 2 L nasal cannula home oxygen. Stable no acute issues. Supportive care.  Follows with Dr. Sherene SiresWert.  3.ED status post CABG. No acute issues on medications continued unchanged. Follows with Dr. Delton SeeNelson for cardiac issues.  4.Acute on chronic diastolic CHF last known EF 60% on echocardiogram  done few months ago.  Now compensated after single dose Lasix on 10/28/2018.  5.Morbid obesity. Follow with PCP for weight loss.  6.Dyslipidemia. Continue home dose statin.  7.GERD. Placed on PPI.  8.Mild ARF.    Renal function back to baseline after gentle hydration, first day required Lasix due to mild CHF upon admission there after about 750 cc of fluid.  9.Essential hypertension. Blood pressure has now stabilized resume home medications unchanged.   Discharge diagnosis     Active Problems:   Essential hypertension   COPD III spirometry if use FEV1/VC p saba    Chronic respiratory failure with hypoxia and hypercapnia (HCC)   History of home oxygen therapy   GERD (gastroesophageal reflux disease)   NSTEMI (non-ST elevated myocardial infarction) (HCC)   ICH (intracerebral hemorrhage) (HCC)   CAP (community acquired pneumonia)    Discharge instructions    Discharge Instructions    Diet - low sodium heart  healthy   Complete by:  As directed    Discharge instructions   Complete by:  As directed    Follow with Primary MD [name], Amber Patricia, MD in 7 days   Get CBC, CMP, 2 view Chest X ray -  checked  by Primary MD  in 5-7 days   Activity: As tolerated with Full fall precautions use walker/cane & assistance as needed  Disposition Home    Diet: Heart Healthy   Special Instructions: If you have smoked or chewed Tobacco  in the last 2 yrs please stop smoking, stop any regular Alcohol  and or any Recreational drug use.  On your next visit with your primary care physician please Get Medicines reviewed and adjusted.  Please request your Prim.MD to go over all Hospital Tests and Procedure/Radiological results at the follow up, please get all Hospital records sent to your Prim MD by signing hospital release before you go home.  If you experience worsening of your admission symptoms, develop shortness of breath, life threatening emergency, suicidal or homicidal thoughts you must seek medical attention immediately by calling 911 or calling your MD immediately  if symptoms less severe.  You Must read complete instructions/literature along with all the possible adverse reactions/side effects for all the Medicines you take and that have been prescribed to you. Take any new Medicines after you have completely understood and accpet all the possible adverse reactions/side effects.   Increase activity slowly   Complete by:  As directed       Discharge Medications   Allergies as of 10/30/2018   No Known Allergies     Medication List    TAKE these medications   albuterol 108 (90 Base) MCG/ACT inhaler Commonly known as:  PROAIR HFA 2 puffs every 4 hours as needed only  if your can't catch your breath What changed:    how much to take  how to take this  when to take this  reasons to take this  additional instructions   atorvastatin 40 MG tablet Commonly known as:  LIPITOR Take 1  tablet (40 mg total) by mouth daily at 6 PM.   citalopram 40 MG tablet Commonly known as:  CELEXA TAKE 1 TABLET BY MOUTH  EVERY MORNING What changed:  when to take this   furosemide 20 MG tablet Commonly known as:  LASIX TAKE 1 TABLET BY MOUTH TWO  TIMES DAILY What changed:    how much to take  when to take this   levofloxacin 750 MG tablet Commonly known  asBarbera Huff Take 1 tablet (750 mg total) by mouth daily for 4 days.   levothyroxine 150 MCG tablet Commonly known as:  SYNTHROID, LEVOTHROID TAKE 1 TABLET BY MOUTH  DAILY BEFORE BREAKFAST What changed:    how much to take  how to take this  when to take this  additional instructions   losartan 50 MG tablet Commonly known as:  COZAAR Take 1 tablet (50 mg total) by mouth daily.   montelukast 10 MG tablet Commonly known as:  SINGULAIR TAKE 1 TABLET BY MOUTH AT  BEDTIME   OXYGEN Inhale 2 L into the lungs continuous. continuous o2   pantoprazole 40 MG tablet Commonly known as:  PROTONIX Take 1 tablet (40 mg total) by mouth daily before breakfast.   SIMPONI ARIA 50 MG/4ML Soln injection Generic drug:  golimumab Inject 50 mg into the vein every 8 (eight) weeks.   SYMBICORT 160-4.5 MCG/ACT inhaler Generic drug:  budesonide-formoterol USE 2 PUFFS TWO TIMES DAILY What changed:  See the new instructions.   traMADol 50 MG tablet Commonly known as:  ULTRAM TAKE 1-2 TABLETS EVERY 6 HOURS AS NEEDED FOR PAIN What changed:    how much to take  how to take this  when to take this  additional instructions   triamcinolone cream 0.1 % Commonly known as:  KENALOG Apply 1 application topically 2 (two) times daily as needed (for psorisis).   TYLENOL ARTHRITIS PAIN 650 MG CR tablet Generic drug:  acetaminophen Take 1,300 mg by mouth every 8 (eight) hours as needed for pain. Per bottle directions as needed   Vitamin D3 50 MCG (2000 UT) Tabs Take 2,000 Int'l Units by mouth daily.       Follow-up Information     [name], Amber Patricia, MD. Schedule an appointment as soon as possible for a visit in 1 week(s).   Specialty:  Internal Medicine Contact information: [address] [place] [address] [phone]        Lars Masson, MD. Schedule an appointment as soon as possible for a visit in 1 week(s).   Specialty:  Cardiology Contact information: [address] [place] [address] [phone]           Major procedures and Radiology Reports - PLEASE review detailed and final reports thoroughly  -        Dg Chest 2 View  Result Date: 10/28/2018 CLINICAL DATA:  Short of breath, dizziness, bilateral chest pain and cough over the last 2 days EXAM: CHEST - 2 VIEW COMPARISON:  Chest x-ray of 08/31/2017 FINDINGS: The lungs are poorly aerated and there is basilar atelectasis present. There is cardiomegaly present with small effusions and possible pulmonary vascular congestion, which may indicate mild congestive heart failure. Median sternotomy sutures are noted. No bony abnormality is seen. IMPRESSION: Poor inspiration with cardiomegaly, probable small effusions, and minimal congestion may indicate mild CHF. No focal pneumonia. Electronically Signed   By: Dwyane Dee M.D.   On: 10/28/2018 14:50   Ct Angio Chest Pe W And/or Wo Contrast  Result Date: 10/28/2018 CLINICAL DATA:  Chest pain, shortness of breath for 3 days, cough EXAM: CT ANGIOGRAPHY CHEST WITH CONTRAST TECHNIQUE: Multidetector CT imaging of the chest was performed using the standard protocol during bolus administration of intravenous contrast. Multiplanar CT image reconstructions and MIPs were obtained to evaluate the vascular anatomy. CONTRAST:  46mL ISOVUE-370 IOPAMIDOL (ISOVUE-370) INJECTION 76% COMPARISON:  Chest x-ray of 10/28/2018 and 08/31/2018, and CT chest  of 07/23/2015 FINDINGS: Cardiovascular: The pulmonary arteries are moderately well opacified. There is no evidence of acute pulmonary  embolism. The thoracic aorta is faintly opacified and moderate thoracic aortic atherosclerosis is present. The heart is mildly enlarged and there are diffuse coronary artery calcifications present. Calcification of of the mitral annulus also is noted. Mediastinum/Nodes: There is significant motion and artifact present making evaluation of the mediastinum difficult. There do appear to be somewhat prominent mediastinal nodes present 1 of the larger nodes is precarinal on image 129 series 7 measuring 15 mm. Also there appear to be right subcarinal nodes present which are difficult to measure. A right paratracheal node on image 95 measures 11 mm. These most likely are reactive nodes. Lungs/Pleura: There is apparent scarring noted anteriorly in both upper lobes which could be related to radiation treatment in the proper clinical setting. However, there are patchy diffuse lung infiltrates greatest in the left lower lobe, anterior inferior left upper lobe, lingula, and right middle lobe most consistent with multifocal pneumonia. Only a tiny amount of fluid is present bilaterally. No suspicious lung nodule is seen. Follow-up after interval treatment is recommended to exclude underlying neoplasm. Upper Abdomen: Multiple streak artifacts overlie the upper abdomen, but no significant abnormality is noted. There does appear to be a small hiatal hernia present. Musculoskeletal: The thoracic vertebrae are in normal alignment with degenerative changes diffusely. There may be mild compression deformity of the anterior superior aspect of T5 vertebral body of uncertain age. Correlate clinically. Review of the MIP images confirms the above findings. IMPRESSION: 1. No evidence of acute pulmonary embolism. 2. However there are patchy lung opacities bilaterally as described above most consistent with multifocal pneumonia. Recommend follow-up after interval treatment. 3. Cardiomegaly and diffuse coronary artery calcifications. 4. Partial  compression of anterior superior aspect of T5 of uncertain age. Correlate clinically. Electronically Signed   By: Dwyane Dee M.D.   On: 10/28/2018 16:10    Micro Results    Recent Results (from the past 240 hour(s))  Culture, blood (routine x 2)     Status: None (Preliminary result)   Collection Time: 10/28/18  5:08 PM  Result Value Ref Range Status   Specimen Description BLOOD RIGHT ANTECUBITAL  Final   Special Requests   Final    BOTTLES DRAWN AEROBIC AND ANAEROBIC Blood Culture adequate volume   Culture   Final    NO GROWTH 2 DAYS Performed at [facility], [address] [place], [place] [address]    Report Status PENDING  Incomplete  Culture, blood (routine x 2)     Status: None (Preliminary result)   Collection Time: 10/28/18  5:08 PM  Result Value Ref Range Status   Specimen Description BLOOD RIGHT FOREARM  Final   Special Requests   Final    BOTTLES DRAWN AEROBIC AND ANAEROBIC Blood Culture adequate volume   Culture   Final    NO GROWTH 2 DAYS Performed at [facility], [address] [place], [place] [address]    Report Status PENDING  Incomplete    Today   Subjective    [name] today has no headache,no chest abdominal pain,no new weakness tingling or numbness, feels much better wants to go home today.     Objective   Blood pressure (!) 159/78, pulse 96, temperature 97.9 F (36.6 C), temperature source Oral, resp. rate 18, SpO2 93 %.   Intake/Output Summary (Last 24 hours) at 10/30/2018 0953 Last data filed at 10/29/2018  2200 Gross per 24 hour  Intake 1015.7 ml  Output -  Net 1015.7 ml    Exam  Awake Alert, Oriented x 3, No new F.N deficits, Normal affect [place].AT,PERRAL Supple Neck,No JVD, No cervical lymphadenopathy appriciated.  Symmetrical Chest wall movement, Good air movement bilaterally, CTAB RRR,No Gallops,Rubs or new Murmurs, No Parasternal Heave +ve B.Sounds, Abd Soft, Non tender, No organomegaly appriciated, No rebound  -guarding or rigidity. No Cyanosis, Clubbing or edema, No new Rash or bruise   Data Review   CBC w Diff:  Lab Results  Component Value Date   WBC 10.4 10/30/2018   HGB 8.8 (L) 10/30/2018   HGB 11.9 06/23/2018   HCT 30.9 (L) 10/30/2018   HCT 38.9 06/23/2018   PLT 206 10/30/2018   PLT 199 06/23/2018   LYMPHOPCT 3 10/28/2018   MONOPCT 4 10/28/2018   EOSPCT 0 10/28/2018   BASOPCT 0 10/28/2018    CMP:  Lab Results  Component Value Date   NA 139 10/30/2018   NA 142 06/23/2018   K 4.0 10/30/2018   CL 95 (L) 10/30/2018   CO2 37 (H) 10/30/2018   BUN 32 (H) 10/30/2018   BUN 16 06/23/2018   CREATININE 1.12 (H) 10/30/2018   CREATININE 0.87 09/29/2016   PROT 6.6 10/28/2018   PROT 7.2 06/23/2018   ALBUMIN 3.0 (L) 10/28/2018   ALBUMIN 4.3 06/23/2018   BILITOT 0.7 10/28/2018   BILITOT 0.3 06/23/2018   ALKPHOS 44 10/28/2018   AST 20 10/28/2018   ALT 17 10/28/2018  .   Total Time in preparing paper work, data evaluation and todays exam - 35 minutes  Susa Raring M.D on 10/30/2018 at 9:53 AM  [facility]   Office  [phone]

## 2018-11-01 ENCOUNTER — Telehealth: Payer: Self-pay | Admitting: *Deleted

## 2018-11-01 ENCOUNTER — Inpatient Hospital Stay (HOSPITAL_COMMUNITY)
Admission: EM | Admit: 2018-11-01 | Discharge: 2018-11-04 | DRG: 190 | Disposition: A | Discharge status: Home Health | Payer: Medicare Other | Attending: Internal Medicine | Admitting: Internal Medicine

## 2018-11-01 ENCOUNTER — Emergency Department (HOSPITAL_COMMUNITY): Payer: Medicare Other

## 2018-11-01 ENCOUNTER — Encounter (HOSPITAL_COMMUNITY): Payer: Self-pay | Admitting: *Deleted

## 2018-11-01 ENCOUNTER — Other Ambulatory Visit: Payer: Self-pay

## 2018-11-01 DIAGNOSIS — E039 Hypothyroidism, unspecified: Secondary | ICD-10-CM | POA: Diagnosis not present

## 2018-11-01 DIAGNOSIS — J9621 Acute and chronic respiratory failure with hypoxia: Secondary | ICD-10-CM | POA: Diagnosis not present

## 2018-11-01 DIAGNOSIS — Z6841 Body Mass Index (BMI) 40.0 and over, adult: Secondary | ICD-10-CM

## 2018-11-01 DIAGNOSIS — Z951 Presence of aortocoronary bypass graft: Secondary | ICD-10-CM | POA: Diagnosis not present

## 2018-11-01 DIAGNOSIS — Z8261 Family history of arthritis: Secondary | ICD-10-CM

## 2018-11-01 DIAGNOSIS — J449 Chronic obstructive pulmonary disease, unspecified: Secondary | ICD-10-CM | POA: Diagnosis present

## 2018-11-01 DIAGNOSIS — J440 Chronic obstructive pulmonary disease with acute lower respiratory infection: Principal | ICD-10-CM | POA: Diagnosis present

## 2018-11-01 DIAGNOSIS — L4050 Arthropathic psoriasis, unspecified: Secondary | ICD-10-CM | POA: Diagnosis present

## 2018-11-01 DIAGNOSIS — J9612 Chronic respiratory failure with hypercapnia: Secondary | ICD-10-CM | POA: Diagnosis not present

## 2018-11-01 DIAGNOSIS — Z79899 Other long term (current) drug therapy: Secondary | ICD-10-CM

## 2018-11-01 DIAGNOSIS — I10 Essential (primary) hypertension: Secondary | ICD-10-CM | POA: Diagnosis not present

## 2018-11-01 DIAGNOSIS — Z79890 Hormone replacement therapy (postmenopausal): Secondary | ICD-10-CM

## 2018-11-01 DIAGNOSIS — K2270 Barrett's esophagus without dysplasia: Secondary | ICD-10-CM | POA: Diagnosis present

## 2018-11-01 DIAGNOSIS — Z8249 Family history of ischemic heart disease and other diseases of the circulatory system: Secondary | ICD-10-CM

## 2018-11-01 DIAGNOSIS — E785 Hyperlipidemia, unspecified: Secondary | ICD-10-CM | POA: Diagnosis present

## 2018-11-01 DIAGNOSIS — I252 Old myocardial infarction: Secondary | ICD-10-CM

## 2018-11-01 DIAGNOSIS — J8410 Pulmonary fibrosis, unspecified: Secondary | ICD-10-CM | POA: Diagnosis present

## 2018-11-01 DIAGNOSIS — J189 Pneumonia, unspecified organism: Secondary | ICD-10-CM | POA: Diagnosis present

## 2018-11-01 DIAGNOSIS — Z96653 Presence of artificial knee joint, bilateral: Secondary | ICD-10-CM | POA: Diagnosis present

## 2018-11-01 DIAGNOSIS — K219 Gastro-esophageal reflux disease without esophagitis: Secondary | ICD-10-CM | POA: Diagnosis present

## 2018-11-01 DIAGNOSIS — Z8679 Personal history of other diseases of the circulatory system: Secondary | ICD-10-CM

## 2018-11-01 DIAGNOSIS — J441 Chronic obstructive pulmonary disease with (acute) exacerbation: Secondary | ICD-10-CM | POA: Diagnosis not present

## 2018-11-01 DIAGNOSIS — Z79891 Long term (current) use of opiate analgesic: Secondary | ICD-10-CM

## 2018-11-01 DIAGNOSIS — I5032 Chronic diastolic (congestive) heart failure: Secondary | ICD-10-CM | POA: Diagnosis not present

## 2018-11-01 DIAGNOSIS — Z9981 Dependence on supplemental oxygen: Secondary | ICD-10-CM

## 2018-11-01 DIAGNOSIS — I2510 Atherosclerotic heart disease of native coronary artery without angina pectoris: Secondary | ICD-10-CM | POA: Diagnosis present

## 2018-11-01 DIAGNOSIS — Z9049 Acquired absence of other specified parts of digestive tract: Secondary | ICD-10-CM

## 2018-11-01 DIAGNOSIS — I491 Atrial premature depolarization: Secondary | ICD-10-CM | POA: Diagnosis present

## 2018-11-01 DIAGNOSIS — Z7951 Long term (current) use of inhaled steroids: Secondary | ICD-10-CM

## 2018-11-01 DIAGNOSIS — R0902 Hypoxemia: Secondary | ICD-10-CM | POA: Diagnosis not present

## 2018-11-01 DIAGNOSIS — I4519 Other right bundle-branch block: Secondary | ICD-10-CM | POA: Diagnosis present

## 2018-11-01 DIAGNOSIS — I110 Hypertensive heart disease with heart failure: Secondary | ICD-10-CM | POA: Diagnosis present

## 2018-11-01 DIAGNOSIS — Z8673 Personal history of transient ischemic attack (TIA), and cerebral infarction without residual deficits: Secondary | ICD-10-CM

## 2018-11-01 DIAGNOSIS — I959 Hypotension, unspecified: Secondary | ICD-10-CM | POA: Diagnosis not present

## 2018-11-01 DIAGNOSIS — Z803 Family history of malignant neoplasm of breast: Secondary | ICD-10-CM

## 2018-11-01 DIAGNOSIS — R0602 Shortness of breath: Secondary | ICD-10-CM | POA: Diagnosis not present

## 2018-11-01 DIAGNOSIS — Z87891 Personal history of nicotine dependence: Secondary | ICD-10-CM

## 2018-11-01 DIAGNOSIS — J45991 Cough variant asthma: Secondary | ICD-10-CM | POA: Diagnosis present

## 2018-11-01 HISTORY — DX: Heart failure, unspecified: I50.9

## 2018-11-01 HISTORY — DX: Cerebral infarction, unspecified: I63.9

## 2018-11-01 LAB — CBC
HCT: 32.1 % — ABNORMAL LOW (ref 36.0–46.0)
Hemoglobin: 9 g/dL — ABNORMAL LOW (ref 12.0–15.0)
MCH: 28.4 pg (ref 26.0–34.0)
MCHC: 28 g/dL — ABNORMAL LOW (ref 30.0–36.0)
MCV: 101.3 fL — ABNORMAL HIGH (ref 80.0–100.0)
Platelets: 246 10*3/uL (ref 150–400)
RBC: 3.17 MIL/uL — ABNORMAL LOW (ref 3.87–5.11)
RDW: 15 % (ref 11.5–15.5)
WBC: 9.7 10*3/uL (ref 4.0–10.5)
nRBC: 0 % (ref 0.0–0.2)

## 2018-11-01 LAB — I-STAT TROPONIN, ED: Troponin i, poc: 0.02 ng/mL (ref 0.00–0.08)

## 2018-11-01 LAB — BASIC METABOLIC PANEL
Anion gap: 10 (ref 5–15)
BUN: 21 mg/dL (ref 8–23)
CO2: 41 mmol/L — ABNORMAL HIGH (ref 22–32)
Calcium: 9.5 mg/dL (ref 8.9–10.3)
Chloride: 91 mmol/L — ABNORMAL LOW (ref 98–111)
Creatinine, Ser: 0.98 mg/dL (ref 0.44–1.00)
GFR calc Af Amer: >60 mL/min (ref >60)
GFR calc non Af Amer: 58 mL/min — ABNORMAL LOW (ref >60)
Glucose, Bld: 114 mg/dL — ABNORMAL HIGH (ref 70–99)
Potassium: 4.3 mmol/L (ref 3.5–5.1)
Sodium: 142 mmol/L (ref 135–145)

## 2018-11-01 LAB — BRAIN NATRIURETIC PEPTIDE: B Natriuretic Peptide: 563.3 pg/mL — ABNORMAL HIGH (ref 0.0–100.0)

## 2018-11-01 MED ORDER — GUAIFENESIN ER 600 MG PO TB12
600.0000 mg | ORAL_TABLET | Freq: Two times a day (BID) | ORAL | Status: DC
  Administered 2018-11-01 – 2018-11-04 (×6): 600 mg via ORAL
  Filled 2018-11-01 (×6): qty 1

## 2018-11-01 MED ORDER — LOSARTAN POTASSIUM 50 MG PO TABS
50.0000 mg | ORAL_TABLET | Freq: Every day | ORAL | Status: DC
  Administered 2018-11-01 – 2018-11-04 (×4): 50 mg via ORAL
  Filled 2018-11-01 (×4): qty 1

## 2018-11-01 MED ORDER — SODIUM CHLORIDE 0.9% FLUSH: 3.0000 mL | INTRAVENOUS | Status: DC | PRN

## 2018-11-01 MED ORDER — FUROSEMIDE 10 MG/ML IJ SOLN
80.0000 mg | Freq: Once | INTRAMUSCULAR | Status: AC
  Administered 2018-11-01: 80 mg via INTRAVENOUS
  Filled 2018-11-01: qty 8

## 2018-11-01 MED ORDER — FUROSEMIDE 20 MG PO TABS
20.0000 mg | ORAL_TABLET | Freq: Two times a day (BID) | ORAL | Status: DC
  Administered 2018-11-01 – 2018-11-04 (×6): 20 mg via ORAL
  Filled 2018-11-01 (×7): qty 1

## 2018-11-01 MED ORDER — ALBUTEROL SULFATE (2.5 MG/3ML) 0.083% IN NEBU: 2.5000 mg | INHALATION_SOLUTION | RESPIRATORY_TRACT | Status: AC

## 2018-11-01 MED ORDER — IPRATROPIUM-ALBUTEROL 0.5-2.5 (3) MG/3ML IN SOLN
3.0000 mL | Freq: Four times a day (QID) | RESPIRATORY_TRACT | Status: DC
  Administered 2018-11-01: 3 mL via RESPIRATORY_TRACT
  Filled 2018-11-01: qty 3

## 2018-11-01 MED ORDER — ENOXAPARIN SODIUM 40 MG/0.4ML ~~LOC~~ SOLN
40.0000 mg | SUBCUTANEOUS | Status: DC
  Administered 2018-11-01 – 2018-11-03 (×3): 40 mg via SUBCUTANEOUS
  Filled 2018-11-01 (×3): qty 0.4

## 2018-11-01 MED ORDER — CITALOPRAM HYDROBROMIDE 20 MG PO TABS
40.0000 mg | ORAL_TABLET | Freq: Every morning | ORAL | Status: DC
  Administered 2018-11-02 – 2018-11-04 (×3): 40 mg via ORAL
  Filled 2018-11-01 (×2): qty 1
  Filled 2018-11-01: qty 2

## 2018-11-01 MED ORDER — ACETAMINOPHEN 325 MG PO TABS: 650.0000 mg | ORAL_TABLET | Freq: Four times a day (QID) | ORAL | Status: DC | PRN

## 2018-11-01 MED ORDER — PREDNISONE 20 MG PO TABS: 40.0000 mg | ORAL_TABLET | Freq: Every day | ORAL | Status: DC

## 2018-11-01 MED ORDER — PANTOPRAZOLE SODIUM 40 MG PO TBEC
40.0000 mg | DELAYED_RELEASE_TABLET | Freq: Every day | ORAL | Status: DC
  Administered 2018-11-02 – 2018-11-04 (×3): 40 mg via ORAL
  Filled 2018-11-01 (×3): qty 1

## 2018-11-01 MED ORDER — LEVOTHYROXINE SODIUM 75 MCG PO TABS
150.0000 ug | ORAL_TABLET | Freq: Every day | ORAL | Status: DC
  Administered 2018-11-02 – 2018-11-04 (×3): 150 ug via ORAL
  Filled 2018-11-01: qty 2
  Filled 2018-11-01: qty 1
  Filled 2018-11-01 (×2): qty 2
  Filled 2018-11-01: qty 1

## 2018-11-01 MED ORDER — IPRATROPIUM-ALBUTEROL 0.5-2.5 (3) MG/3ML IN SOLN
3.0000 mL | Freq: Once | RESPIRATORY_TRACT | Status: AC
  Administered 2018-11-01: 3 mL via RESPIRATORY_TRACT
  Filled 2018-11-01: qty 3

## 2018-11-01 MED ORDER — LEVOFLOXACIN 750 MG PO TABS
750.0000 mg | ORAL_TABLET | Freq: Every day | ORAL | Status: DC
  Administered 2018-11-01 – 2018-11-02 (×2): 750 mg via ORAL
  Filled 2018-11-01 (×2): qty 1

## 2018-11-01 MED ORDER — METHYLPREDNISOLONE SODIUM SUCC 125 MG IJ SOLR
125.0000 mg | Freq: Once | INTRAMUSCULAR | Status: AC
  Administered 2018-11-01: 125 mg via INTRAVENOUS
  Filled 2018-11-01: qty 2

## 2018-11-01 MED ORDER — ACETAMINOPHEN 650 MG RE SUPP: 650.0000 mg | Freq: Four times a day (QID) | RECTAL | Status: DC | PRN

## 2018-11-01 MED ORDER — TRAMADOL HCL 50 MG PO TABS
50.0000 mg | ORAL_TABLET | Freq: Two times a day (BID) | ORAL | Status: DC
  Administered 2018-11-01 – 2018-11-04 (×6): 50 mg via ORAL
  Filled 2018-11-01 (×6): qty 1

## 2018-11-01 MED ORDER — METHYLPREDNISOLONE SODIUM SUCC 40 MG IJ SOLR
40.0000 mg | Freq: Two times a day (BID) | INTRAMUSCULAR | Status: DC
  Administered 2018-11-01 – 2018-11-03 (×5): 40 mg via INTRAVENOUS
  Filled 2018-11-01 (×5): qty 1

## 2018-11-01 MED ORDER — SODIUM CHLORIDE 0.9 % IV SOLN: 250.0000 mL | INTRAVENOUS | Status: DC | PRN

## 2018-11-01 MED ORDER — MONTELUKAST SODIUM 10 MG PO TABS
10.0000 mg | ORAL_TABLET | Freq: Every day | ORAL | Status: DC
  Administered 2018-11-01 – 2018-11-03 (×3): 10 mg via ORAL
  Filled 2018-11-01 (×3): qty 1

## 2018-11-01 MED ORDER — SODIUM CHLORIDE 0.9% FLUSH
3.0000 mL | Freq: Two times a day (BID) | INTRAVENOUS | Status: DC
  Administered 2018-11-01 – 2018-11-03 (×5): 3 mL via INTRAVENOUS

## 2018-11-01 MED ORDER — ATORVASTATIN CALCIUM 40 MG PO TABS
40.0000 mg | ORAL_TABLET | Freq: Every day | ORAL | Status: DC
  Administered 2018-11-01 – 2018-11-03 (×3): 40 mg via ORAL
  Filled 2018-11-01 (×3): qty 1

## 2018-11-01 MED ORDER — ALBUTEROL SULFATE (2.5 MG/3ML) 0.083% IN NEBU: 2.5000 mg | INHALATION_SOLUTION | RESPIRATORY_TRACT | Status: DC | PRN

## 2018-11-01 NOTE — Progress Notes (Signed)
Patient admits to room 18, alert oriented x 4, family at bedside. Patient is on 3 L [place], no c/o any pain at this time. Patient is on the [facility] and daughter at bedside instruct patient to call when she is done. Will continue to monitor patient. Report received from [place], [place].

## 2018-11-01 NOTE — ED Notes (Signed)
Attempted report 

## 2018-11-01 NOTE — Telephone Encounter (Signed)
Transition Care Management Follow-up Telephone Call  Admit date: 10/28/2018  Discharge date: 10/30/2018  Admitted From: Home   Disposition:  Home   Recommendations for Outpatient Follow-up:   Follow up with PCP in 1-2 weeks  PCP Please obtain BMP/CBC, 2 view CXR in 1week,  (see Discharge instructions)   PCP Please follow up on the following pending results:    Home Health: None   Equipment/Devices: None  Consultations: None Discharge Condition: Stable   CODE STATUS: Full   Diet Recommendation: Heart Healthy        How have you been since you were released from the hospital? Very tired and weak.  Taking medication with some relief.   Do you understand why you were in the hospital? yes   Do you understand the discharge instructions? yes   Where were you discharged to? home   Items Reviewed:  Medications reviewed: yes  Allergies reviewed: yes  Dietary changes reviewed: yes  Referrals reviewed: yes   Functional Questionnaire:   Activities of Daily Living (ADLs):   She states they are independent in the following: none States they require assistance with the following: none   Any transportation issues/concerns?: yes.  Daughter will provide transportation.     Any patient concerns? no   Confirmed importance and date/time of follow-up visits scheduled yes  Provider Appointment booked with Dr Ardyth Harps 11/09/18 at 2:45 pm  Confirmed with patient if condition begins to worsen call PCP or go to the ER.  Patient was given the office number and encouraged to call back with question or concerns.  : yes

## 2018-11-01 NOTE — ED Provider Notes (Signed)
[facility] EMERGENCY DEPARTMENT Provider Note   CSN: [id number] Arrival date & time: 11/01/18  1300     History   Chief Complaint Chief Complaint  Patient presents with  . Shortness of Breath    HPI [name] is a 72 y.o. female.  Patient was discharged from the hospital on December 28 for pneumonia.  Patient was admitted December 26.  Patient brought back today by EMS.  Patient had increased shortness of breath this morning.  Patient states she still has 3 more days of antibiotics.  Patient is with sats of 88% on home 3 L upon EMS arrival.  That increased and improved with no interventions on their part.  Patient has a history of COPD and CHF.  Patient when she was admitted on December 20 6% of his sats of 88% on 2 L.  At rest patient seems to be doing better.     Past Medical History:  Diagnosis Date  . Acute bronchitis 09/25/2015  . ACUTE ON CHRONIC DIASTOLIC HEART FAILURE 12/04/2010   Qualifier: Diagnosis of  By: Ladona Ridgel, MD, [facility], [place]ia Alcon   . Arthritis    OA RIGHT KNEE WITH PAIN  . Barrett esophagus   . Bradycardia 06/01/2015  . Chronic respiratory failure (HCC)   . Chronic respiratory failure with hypoxia and hypercapnia (HCC) 02/04/2010   Followed in Pulmonary clinic/ [facility] [place]       - 02 dependent  since 07/02/10 >>  83% RA December 05, 2010       - ONO RA 08/05/12  :  Positive sat < 89 x 2:45m> repeat on 2lpm rec 08/12/2012  - 06/17/2013 reported desat with activity p Knee surgery > rec restart 2lpm with activity  - 06/27/2013   Walked 2lpm  x one lap @ 185 stopped due to sat 88% not sob , desat to 82% on RA just at th  . COPD (chronic obstructive pulmonary disease) (HCC)   . COPD III spirometry if use FEV1/VC p saba  07/18/2010   Quit smoking May 2006       - PFT's  04/12/10 FEV1  1.21 (69%) ratio 77 and no change p B2,  DLC0 56%   VC 70%         - PFTs  08/08/2013 FEV1 1.21 (60%) ratio 86 and no change p B2 DLCO 79%  VC 72%  On  symbicort 160 2bid  - PFT's  02/08/2018  FEV1 0.70 (40 % ) ratio 56 if use FEV1/VC  p 38 % improvement from saba p symb 160 prior to study with DLCO  78 % corrects to 147  % for alv volume   - 02/08/2018  . Cough variant asthma 02/26/2011   Followed in Pulmonary clinic/ [facility] [place]  - PFT's  06/04/15  FEV1 1.20 (67 % ) ratio 83  p 6 % improvement from saba with DLCO  80 % corrects to 132 % for alv volume      - Clinical dx based on response to symbicort       FENO 09/16/2016  =   96 on symbicort 160 2bid > added singulair  Allergy profile 09/16/2016 >  Eos 0.5 /  IgE  78 neg RAST  -  Referred to rehab 04/29/2017 > completed  . DOE (dyspnea on exertion) 02/19/2016  . Essential hypertension 04/20/2007   Qualifier: Diagnosis of  By: Marcelyn Ditty RN, Katy Fitch   . GERD (gastroesophageal reflux disease)   .  History of ARDS 2006  . History of home oxygen therapy    AT NIGHT WHEN SLEEPING 2 L / MIN NASAL CANNULA  . Hyperlipidemia 07/12/2015  . Hypertension   . Hypothyroidism   . Morbid (severe) obesity due to excess calories (HCC) 04/22/2015   pfts with erv 14% 06/04/15  And 33% 02/08/2018   . NSTEMI (non-ST elevated myocardial infarction) (HCC) 05/31/2015  . Pneumococcal pneumonia (HCC) 2006   HOSPITALIZED AND DEVELOPED ARDS  . Psoriatic arthritis (HCC)   . Pulmonary fibrosis (HCC)   . PULMONARY FIBROSIS ILD POST INFLAMMATORY CHRONIC 07/18/2010   Followed as Primary Care Patient/ [facility] [place]  -s/p ARDS 2006 with bacteremic S  Pna       - CT chest 07/03/10 Nonspecific PF mostly upper lobes       - CT chest 12/03/10 acute gg changes and effusions c/w chf - PFT's  02/08/2018  FVC 0.64 (28 %)   with DLCO  78 % corrects to 147 % for alv volume     . Rheumatic disease   . 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 SOB (shortness of breath) on exertion     Patient Active Problem List   Diagnosis Date Noted  . CAP (community acquired pneumonia) 10/28/2018  . ICH (intracerebral hemorrhage) (HCC) 08/25/2018  .  Hyperlipidemia 07/12/2015  . 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 Bradycardia 06/01/2015  . NSTEMI (non-ST elevated myocardial infarction) (HCC) 05/31/2015  . Morbid (severe) obesity due to excess calories (HCC) 04/22/2015  . Postoperative anemia due to acute blood loss 06/08/2013  . History of home oxygen therapy 06/07/2013  . GERD (gastroesophageal reflux disease) 06/07/2013  . Barrett's esophagus 06/07/2013  . OA (osteoarthritis) of knee 06/06/2013  . ACUTE ON CHRONIC DIASTOLIC HEART FAILURE 12/04/2010  . COPD III spirometry if use FEV1/VC p saba  07/18/2010  . 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 Compression fracture of thoracic vertebra (HCC) 07/02/2010  . Chronic respiratory failure with hypoxia and hypercapnia (HCC) 02/04/2010  . Hypothyroidism 04/20/2007  . Essential hypertension 04/20/2007  . PSORIATIC ARTHRITIS 04/20/2007    Past Surgical History:  Procedure Laterality Date  . ABDOMINOPLASTY    . CARDIAC CATHETERIZATION N/A 06/01/2015   Procedure: Left Heart Cath and Coronary Angiography;  Surgeon: Runell Gess, MD;  Location: [facility] INVASIVE CV LAB;  Service: Cardiovascular;  Laterality: N/A;  . CARPAL TUNNEL RELEASE    . CHOLECYSTECTOMY    . CORONARY ARTERY BYPASS GRAFT N/A 06/04/2015   Procedure: CORONARY ARTERY BYPASS GRAFT times three            with left internal mammary artery and right leg saphenous vein;  Surgeon: Alleen Borne, MD;  Location: [facility] OR;  Service: Open Heart Surgery;  Laterality: N/A;  . cosmetic breast surgery    . JOINT REPLACEMENT    . KNEE ARTHROSCOPY Left   . TEE WITHOUT CARDIOVERSION  06/04/2015   Procedure: TRANSESOPHAGEAL ECHOCARDIOGRAM (TEE);  Surgeon: Alleen Borne, MD;  Location: [facility] OR;  Service: Open Heart Surgery;;  . TOTAL KNEE ARTHROPLASTY Left   . TOTAL KNEE ARTHROPLASTY Right 06/06/2013   Procedure: RIGHT TOTAL KNEE ARTHROPLASTY;  Surgeon: Loanne Drilling, MD;  Location: [facility] ORS;  Service: Orthopedics;  Laterality: Right;       OB History   No obstetric history on file.      Home Medications    Prior to Admission medications   Medication Sig Start Date End Date Taking?  Authorizing Provider  acetaminophen (TYLENOL ARTHRITIS PAIN) 650 MG CR tablet Take 1,300 mg by mouth 2 (two) times daily.    Yes [provider]  albuterol (PROAIR HFA) 108 (90 Base) MCG/ACT inhaler 2 puffs every 4 hours as needed only  if your can't catch your breath Patient taking differently: Inhale 2 puffs into the lungs every 4 (four) hours as needed for shortness of breath (if you can't catch your breath).  07/22/16  Yes Nyoka Cowden, MD  atorvastatin (LIPITOR) 40 MG tablet Take 1 tablet (40 mg total) by mouth daily at 6 PM. 10/15/18  Yes Lars Masson, MD  Cholecalciferol (VITAMIN D3) 2000 UNITS TABS Take 2,000 Int'l Units by mouth daily.   Yes [provider]  citalopram (CELEXA) 40 MG tablet TAKE 1 TABLET BY MOUTH  EVERY MORNING Patient taking differently: Take 40 mg by mouth daily.  09/14/18  Yes Roderick Pee, MD  furosemide (LASIX) 20 MG tablet TAKE 1 TABLET BY MOUTH TWO  TIMES DAILY Patient taking differently: Take 20 mg by mouth 2 (two) times daily.  03/30/18  Yes Lars Masson, MD  golimumab (SIMPONI ARIA) 50 MG/4ML SOLN injection Inject 50 mg into the vein every 8 (eight) weeks.    Yes [provider]  levofloxacin (LEVAQUIN) 750 MG tablet Take 1 tablet (750 mg total) by mouth daily for 4 days. 10/30/18 11/03/18 Yes Leroy Sea, MD  levothyroxine (SYNTHROID, LEVOTHROID) 150 MCG tablet TAKE 1 TABLET BY MOUTH  DAILY BEFORE BREAKFAST Patient taking differently: Take 150 mcg by mouth daily before breakfast.  01/15/18  Yes Lars Masson, MD  losartan (COZAAR) 50 MG tablet Take 1 tablet (50 mg total) by mouth daily. 10/15/18  Yes Lars Masson, MD  montelukast (SINGULAIR) 10 MG tablet TAKE 1 TABLET BY MOUTH AT  BEDTIME Patient taking differently: Take 10 mg by mouth at bedtime.   09/14/18  Yes Nyoka Cowden, MD  OXYGEN Inhale 2 L into the lungs continuous. continuous o2    Yes [provider]  pantoprazole (PROTONIX) 40 MG tablet Take 1 tablet (40 mg total) by mouth daily before breakfast. 09/14/18  Yes Nyoka Cowden, MD  SYMBICORT 160-4.5 MCG/ACT inhaler USE 2 PUFFS TWO TIMES DAILY Patient taking differently: Inhale 2 puffs into the lungs 2 (two) times daily.  04/01/18  Yes Lars Masson, MD  traMADol (ULTRAM) 50 MG tablet TAKE 1-2 TABLETS EVERY 6 HOURS AS NEEDED FOR PAIN Patient taking differently: Take 50 mg by mouth 2 (two) times daily.  07/15/17  Yes Gordy Savers, MD  triamcinolone cream (KENALOG) 0.1 % Apply 1 application topically 2 (two) times daily as needed (for psoriasis).    Yes [provider]    Family History Family History  Problem Relation Age of Onset  . Breast cancer Mother   . Coronary artery disease Father   . Rheum arthritis Father     Social History Social History   Tobacco Use  . Smoking status: Former Smoker    Packs/day: 1.50    Years: 58.50    Pack years: 87.75    Types: Cigarettes    Last attempt to quit: 03/03/2005    Years since quitting: 13.6  . Smokeless tobacco: Never Used  Substance Use Topics  . Alcohol use: No  . Drug use: No     Allergies   Patient has no known allergies.   Review of Systems Review of Systems  Constitutional: Negative for fever.  HENT: Negative for congestion.   Eyes: Negative for redness.  Respiratory: Positive for shortness of breath.   Cardiovascular: Negative for chest pain.  Gastrointestinal: Negative for abdominal pain.  Genitourinary: Negative for dysuria.  Musculoskeletal: Negative for myalgias.  Skin: Negative for rash.  Neurological: Negative for syncope.  Psychiatric/Behavioral: Negative for confusion.     Physical Exam Updated Vital Signs BP (!) 153/87   Pulse 85   Temp 98.2 F (36.8 C) (Oral)   Resp 20   Ht 1.499 m (4\' 11" )   Wt  90.7 kg   SpO2 98%   BMI 40.40 kg/m   Physical Exam Vitals signs and nursing note reviewed.  Constitutional:      General: She is not in acute distress.    Appearance: She is not toxic-appearing.  HENT:     Head: Normocephalic and atraumatic.     Mouth/Throat:     Mouth: Mucous membranes are moist.  Eyes:     Extraocular Movements: Extraocular movements intact.     Conjunctiva/sclera: Conjunctivae normal.     Pupils: Pupils are equal, round, and reactive to light.  Neck:     Musculoskeletal: Neck supple.  Cardiovascular:     Rate and Rhythm: Normal rate and regular rhythm.     Pulses: Normal pulses.     Heart sounds: Normal heart sounds.  Pulmonary:     Effort: Pulmonary effort is normal.     Breath sounds: Wheezing present.     Comments: Faint bilateral wheezing.  No respiratory distress. Abdominal:     General: Bowel sounds are normal.     Palpations: Abdomen is soft.     Tenderness: There is no abdominal tenderness.  Musculoskeletal: Normal range of motion.  Skin:    General: Skin is warm.  Neurological:     General: No focal deficit present.     Mental Status: She is alert and oriented to person, place, and time.      ED Treatments / Results  Labs (all labs ordered are listed, but only abnormal results are displayed) Labs Reviewed  BASIC METABOLIC PANEL - Abnormal; Notable for the following components:      Result Value   Chloride 91 (*)    CO2 41 (*)    Glucose, Bld 114 (*)    GFR calc non Af Amer 58 (*)    All other components within normal limits  CBC - Abnormal; Notable for the following components:   RBC 3.17 (*)    Hemoglobin 9.0 (*)    HCT 32.1 (*)    MCV 101.3 (*)    MCHC 28.0 (*)    All other components within normal limits  BRAIN NATRIURETIC PEPTIDE  I-STAT TROPONIN, ED    EKG EKG Interpretation  Date/Time:  Monday November 01 2018 13:16:21 EST Ventricular Rate:  89 PR Interval:    QRS Duration: 118 QT Interval:  358 QTC  Calculation: 436 R Axis:   97 Text Interpretation:  Right and left arm electrode reversal, interpretation assumes no reversal Sinus rhythm Atrial premature complexes in couplets Short PR interval Probable left atrial enlargement Incomplete right bundle branch block Low voltage, precordial leads Nonspecific T abnormalities, lateral leads Confirmed by Vanetta Mulders [phone]) on 11/01/2018 4:46:10 PM   Radiology Dg Chest Portable 1 View  Result Date: 11/01/2018 CLINICAL DATA:  Shortness of breath.  Recent episode of pneumonia. EXAM: PORTABLE CHEST 1 VIEW COMPARISON:  PA and lateral chest x-ray of October 30, 2018 as well as chest CT  scan of October 28, 2018 and PA and lateral chest x-ray of August 31, 2018. FINDINGS: The lungs are mildly hypoinflated. The interstitial markings remain increased bilaterally greatest at the left lung base. There may be a small left pleural effusion. The heart is top-normal in size. The pulmonary vascularity is indistinct. The patient has undergone previous CABG. There is calcification in the mitral valvular annulus. The observed bony thorax is unremarkable. IMPRESSION: Persistent increased interstitial densities bilaterally greatest at the left lung base. This may reflect pneumonia or mild pulmonary edema or a combination of the same. The appearance of both lungs has not greatly changed since yesterday's study but the appearance of the lungs has deteriorated considerably since an August 31, 2018 study. Electronically Signed   By: David  [place] M.D.   On: 11/01/2018 13:40    Procedures Procedures (including critical care time)  Medications Ordered in ED Medications  ipratropium-albuterol (DUONEB) 0.5-2.5 (3) MG/3ML nebulizer solution 3 mL (3 mLs Nebulization Given 11/01/18 1537)  methylPREDNISolone sodium succinate (SOLU-MEDROL) 125 mg/2 mL injection 125 mg (125 mg Intravenous Given 11/01/18 1607)  furosemide (LASIX) injection 80 mg (80 mg Intravenous Given 11/01/18  1656)     Initial Impression / Assessment and Plan / ED Course  I have reviewed the triage vital signs and the nursing notes.  Pertinent labs & imaging results that were available during my care of the patient were reviewed by me and considered in my medical decision making (see chart for details).     Patient with recent discharge on the 28th.  Patient with a diagnosis of pneumonia.  Discharged on 4 more days of Levaquin.  Patient with increased shortness of breath.  This morning was very bad.  Patient is normally on 2 L of oxygen at home and her sats were in the 80 percentile range as per EMS.  Patient also has a history of COPD and CHF.  Patient was admitted on the 27.  Work-up here chest x-ray without any significant changes.  No evidence of pulmonary edema.  Patient with bilateral wheezing the pneumonia did not appear worse.  Patient received nebulizer treatments and Solu-Medrol.  We ambulated patient and she her sats went down into the 70s.  Contacted the hospitalist they will readmit.  Patient also given some Lasix she normally takes 20 mg of Lasix twice a day.  BMP was ordered and is pending.  Final Clinical Impressions(s) / ED Diagnoses   Final diagnoses:  COPD exacerbation [facility])    ED Discharge Orders    None       Vanetta Mulders, MD 11/08/18 1203

## 2018-11-01 NOTE — ED Triage Notes (Signed)
Pt discharged on 12/28 after being tx for pnx.  States increased sob this am.  States 3 more days of antibiotics.  Pt with sats of 88% on  Home 3L upon ems arrival that increased with no interventions.  Hx of copd and chf.

## 2018-11-01 NOTE — Progress Notes (Deleted)
History and Physical    [name]  XBJ:[id number]  DOB: 03/21/1946  DOA: 11/01/2018 PCP: Philip AspenHernandez Acosta, Limmie PatriciaEstela Y, MD   Patient coming from: home  Chief Complaint: shortness of breath  HPI: Amber Andreaula Edwards Rinehimer is a 72 y.o. female with medical history of  COPD Gold stage 3 on 2 l O2 at baseline, GERD, HTN, CAD/CABG, morbid obesity who was too short of breath today to ambulate a short distance to her bathroom. She was recently admitted for pneumonia and was otherwise doing fine. Her sputum has changed from dark to a light tan and she had no fevers. She has no other complaints.   ED Course: noted to require 4 L to keep pulse ox > 90%, drops to 73% on exertion. After Nebs and IV steroids, she state she still does not feel better.  Review of Systems:  All other systems reviewed and apart from HPI, are negative.  Past Medical History:  Diagnosis Date  . Acute bronchitis 09/25/2015  . ACUTE ON CHRONIC DIASTOLIC HEART FAILURE 12/04/2010   Qualifier: Diagnosis of  By: Ladona Ridgelaylor, MD, [facility] IncFACC, Vergia AlconGregg William   . Arthritis    OA RIGHT KNEE WITH PAIN  . Barrett esophagus   . Bradycardia 06/01/2015  . Chronic respiratory failure (HCC)   . Chronic respiratory failure with hypoxia and hypercapnia (HCC) 02/04/2010   Followed in Pulmonary clinic/ [facility]/ [facility]       - 02 dependent  since 07/02/10 >>  83% RA December 05, 2010       - ONO RA 08/05/12  :  Positive sat < 89 x 2:7369m> repeat on 2lpm rec 08/12/2012  - 06/17/2013 reported desat with activity p Knee surgery > rec restart 2lpm with activity  - 06/27/2013   Walked 2lpm  x one lap @ 185 stopped due to sat 88% not sob , desat to 82% on RA just at th  . COPD (chronic obstructive pulmonary disease) (HCC)   . COPD III spirometry if use FEV1/VC p saba  07/18/2010   Quit smoking May 2006       - PFT's  04/12/10 FEV1  1.21 (69%) ratio 77 and no change p B2,  DLC0 56%   VC 70%         - PFTs  08/08/2013 FEV1 1.21 (60%) ratio 86 and no change p B2  DLCO 79%  VC 72%  On symbicort 160 2bid  - PFT's  02/08/2018  FEV1 0.70 (40 % ) ratio 56 if use FEV1/VC  p 38 % improvement from saba p symb 160 prior to study with DLCO  78 % corrects to 147  % for alv volume   - 02/08/2018  . Cough variant asthma 02/26/2011   Followed in Pulmonary clinic/ [facility]/ [place]  - PFT's  06/04/15  FEV1 1.20 (67 % ) ratio 83  p 6 % improvement from saba with DLCO  80 % corrects to 132 % for alv volume      - Clinical dx based on response to symbicort       FENO 09/16/2016  =   96 on symbicort 160 2bid > added singulair  Allergy profile 09/16/2016 >  Eos 0.5 /  IgE  78 neg RAST  -  Referred to rehab 04/29/2017 > completed  . DOE (dyspnea on exertion) 02/19/2016  . Essential hypertension 04/20/2007   Qualifier: Diagnosis of  By: Marcelyn DittyMondi, RN, Katy FitchJudy Ann   . GERD (gastroesophageal reflux disease)   .  History of ARDS 2006  . History of home oxygen therapy    AT NIGHT WHEN SLEEPING 2 L / MIN NASAL CANNULA  . Hyperlipidemia 07/12/2015  . Hypertension   . Hypothyroidism   . Morbid (severe) obesity due to excess calories (HCC) 04/22/2015   pfts with erv 14% 06/04/15  And 33% 02/08/2018   . NSTEMI (non-ST elevated myocardial infarction) (HCC) 05/31/2015  . Pneumococcal pneumonia (HCC) 2006   HOSPITALIZED AND DEVELOPED ARDS  . Psoriatic arthritis (HCC)   . Pulmonary fibrosis (HCC)   . PULMONARY FIBROSIS ILD POST INFLAMMATORY CHRONIC 07/18/2010   Followed as Primary Care Patient/ [facility]/ [place]  -s/p ARDS 2006 with bacteremic S  Pna       - CT chest 07/03/10 Nonspecific PF mostly upper lobes       - CT chest 12/03/10 acute gg changes and effusions c/w chf - PFT's  02/08/2018  FVC 0.64 (28 %)   with DLCO  78 % corrects to 147 % for alv volume     . Rheumatic disease   . 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 SOB (shortness of breath) on exertion     Past Surgical History:  Procedure Laterality Date  . ABDOMINOPLASTY    . CARDIAC CATHETERIZATION N/A 06/01/2015   Procedure: Left Heart Cath and  Coronary Angiography;  Surgeon: Runell Gess, MD;  Location: [facility] INVASIVE CV LAB;  Service: Cardiovascular;  Laterality: N/A;  . CARPAL TUNNEL RELEASE    . CHOLECYSTECTOMY    . CORONARY ARTERY BYPASS GRAFT N/A 06/04/2015   Procedure: CORONARY ARTERY BYPASS GRAFT times three            with left internal mammary artery and right leg saphenous vein;  Surgeon: Alleen Borne, MD;  Location: [facility] OR;  Service: Open Heart Surgery;  Laterality: N/A;  . cosmetic breast surgery    . JOINT REPLACEMENT    . KNEE ARTHROSCOPY Left   . TEE WITHOUT CARDIOVERSION  06/04/2015   Procedure: TRANSESOPHAGEAL ECHOCARDIOGRAM (TEE);  Surgeon: Alleen Borne, MD;  Location: [facility] OR;  Service: Open Heart Surgery;;  . TOTAL KNEE ARTHROPLASTY Left   . TOTAL KNEE ARTHROPLASTY Right 06/06/2013   Procedure: RIGHT TOTAL KNEE ARTHROPLASTY;  Surgeon: Loanne Drilling, MD;  Location: [facility] ORS;  Service: Orthopedics;  Laterality: Right;    Social History:   reports that she quit smoking about 13 years ago. Her smoking use included cigarettes. She has a 87.75 pack-year smoking history. She has never used smokeless tobacco. She reports that she does not drink alcohol or use drugs. She lives alone at home  No Known Allergies  Family History  Problem Relation Age of Onset  . Breast cancer Mother   . Coronary artery disease Father   . Rheum arthritis Father      Prior to Admission medications   Medication Sig Start Date End Date Taking? Authorizing Provider  acetaminophen (TYLENOL ARTHRITIS PAIN) 650 MG CR tablet Take 1,300 mg by mouth 2 (two) times daily.    Yes [provider]  albuterol (PROAIR HFA) 108 (90 Base) MCG/ACT inhaler 2 puffs every 4 hours as needed only  if your can't catch your breath Patient taking differently: Inhale 2 puffs into the lungs every 4 (four) hours as needed for shortness of breath (if you can't catch your breath).  07/22/16  Yes Nyoka Cowden, MD  atorvastatin (LIPITOR) 40 MG tablet Take 1  tablet (40 mg total) by mouth daily at  6 PM. 10/15/18  Yes Lars Masson, MD  Cholecalciferol (VITAMIN D3) 2000 UNITS TABS Take 2,000 Int'l Units by mouth daily.   Yes [provider]  citalopram (CELEXA) 40 MG tablet TAKE 1 TABLET BY MOUTH  EVERY MORNING Patient taking differently: Take 40 mg by mouth daily.  09/14/18  Yes Roderick Pee, MD  furosemide (LASIX) 20 MG tablet TAKE 1 TABLET BY MOUTH TWO  TIMES DAILY Patient taking differently: Take 20 mg by mouth 2 (two) times daily.  03/30/18  Yes Lars Masson, MD  golimumab (SIMPONI ARIA) 50 MG/4ML SOLN injection Inject 50 mg into the vein every 8 (eight) weeks.    Yes [provider]  levofloxacin (LEVAQUIN) 750 MG tablet Take 1 tablet (750 mg total) by mouth daily for 4 days. 10/30/18 11/03/18 Yes Leroy Sea, MD  levothyroxine (SYNTHROID, LEVOTHROID) 150 MCG tablet TAKE 1 TABLET BY MOUTH  DAILY BEFORE BREAKFAST Patient taking differently: Take 150 mcg by mouth daily before breakfast.  01/15/18  Yes Lars Masson, MD  losartan (COZAAR) 50 MG tablet Take 1 tablet (50 mg total) by mouth daily. 10/15/18  Yes Lars Masson, MD  montelukast (SINGULAIR) 10 MG tablet TAKE 1 TABLET BY MOUTH AT  BEDTIME Patient taking differently: Take 10 mg by mouth at bedtime.  09/14/18  Yes Nyoka Cowden, MD  OXYGEN Inhale 2 L into the lungs continuous. continuous o2    Yes [provider]  pantoprazole (PROTONIX) 40 MG tablet Take 1 tablet (40 mg total) by mouth daily before breakfast. 09/14/18  Yes Nyoka Cowden, MD  SYMBICORT 160-4.5 MCG/ACT inhaler USE 2 PUFFS TWO TIMES DAILY Patient taking differently: Inhale 2 puffs into the lungs 2 (two) times daily.  04/01/18  Yes Lars Masson, MD  traMADol (ULTRAM) 50 MG tablet TAKE 1-2 TABLETS EVERY 6 HOURS AS NEEDED FOR PAIN Patient taking differently: Take 50 mg by mouth 2 (two) times daily.  07/15/17  Yes Gordy Savers, MD  triamcinolone cream (KENALOG) 0.1  % Apply 1 application topically 2 (two) times daily as needed (for psoriasis).    Yes [provider]    Physical Exam: Wt Readings from Last 3 Encounters:  11/01/18 90.7 kg  10/22/18 97.7 kg  10/11/18 94.3 kg   Vitals:   11/01/18 1600 11/01/18 1615 11/01/18 1645 11/01/18 1700  BP:  (!) 132/95  (!) 153/87  Pulse: 88 89 (!) 169 85  Resp: (!) 26 (!) 21 19 20   Temp:      TempSrc:      SpO2: 98% 98% 99% 98%  Weight:      Height:          Constitutional:  Calm & comfortable Eyes: PERRLA, lids and conjunctivae normal ENT:  Mucous membranes are moist.  Pharynx clear of exudate   Normal dentition.  Neck: Supple, no masses  Respiratory:  Bilateral wheezing, RR in 20s- pulse ox 92% on 4 L  Cardiovascular:  S1 & S2 heard, regular rate and rhythm, tachycardic No Murmurs Abdomen:  Non distended No tenderness, No masses Bowel sounds normal Extremities:  No clubbing / cyanosis No pedal edema No joint deformity    Skin:  No rashes, lesions or ulcers Neurologic:  AAO x 3 CN 2-12 grossly intact Sensation intact Strength 5/5 in all 4 extremities Psychiatric:  Normal Mood and affect    Labs on Admission: I have personally reviewed following labs and imaging studies  CBC: Recent Labs  Lab  10/28/18 1400 10/29/18 0257 10/30/18 0903 11/01/18 1313  WBC 15.3* 14.6* 10.4 9.7  NEUTROABS 14.2*  --   --   --   HGB 9.3* 8.6* 8.8* 9.0*  HCT 32.9* 29.5* 30.9* 32.1*  MCV 98.8 98.3 98.1 101.3*  PLT 189 190 206 246   Basic Metabolic Panel: Recent Labs  Lab 10/28/18 1400 10/29/18 0257 10/30/18 0903 11/01/18 1313  NA 140 136 139 142  K 4.2 3.6 4.0 4.3  CL 94* 90* 95* 91*  CO2 36* 36* 37* 41*  GLUCOSE 121* 134* 130* 114*  BUN 34* 38* 32* 21  CREATININE 1.28* 1.39* 1.12* 0.98  CALCIUM 8.9 8.4* 8.9 9.5   GFR: Estimated Creatinine Clearance: 51.7 mL/min (by C-G formula based on SCr of 0.98 mg/dL). Liver Function Tests: Recent Labs  Lab 10/28/18 1400  AST  20  ALT 17  ALKPHOS 44  BILITOT 0.7  PROT 6.6  ALBUMIN 3.0*   No results for input(s): LIPASE, AMYLASE in the last 168 hours. No results for input(s): AMMONIA in the last 168 hours. Coagulation Profile: No results for input(s): INR, PROTIME in the last 168 hours. Cardiac Enzymes: No results for input(s): CKTOTAL, CKMB, CKMBINDEX, TROPONINI in the last 168 hours. BNP (last 3 results) No results for input(s): PROBNP in the last 8760 hours. HbA1C: No results for input(s): HGBA1C in the last 72 hours. CBG: No results for input(s): GLUCAP in the last 168 hours. Lipid Profile: No results for input(s): CHOL, HDL, LDLCALC, TRIG, CHOLHDL, LDLDIRECT in the last 72 hours. Thyroid Function Tests: No results for input(s): TSH, T4TOTAL, FREET4, T3FREE, THYROIDAB in the last 72 hours. Anemia Panel: No results for input(s): VITAMINB12, FOLATE, FERRITIN, TIBC, IRON, RETICCTPCT in the last 72 hours. Urine analysis:    Component Value Date/Time   COLORURINE YELLOW 05/31/2013 1412   APPEARANCEUR CLEAR 05/31/2013 1412   LABSPEC 1.027 05/31/2013 1412   PHURINE 5.0 05/31/2013 1412   GLUCOSEU NEGATIVE 05/31/2013 1412   HGBUR NEGATIVE 05/31/2013 1412   HGBUR negative 07/18/2010 0941   BILIRUBINUR SMALL (A) 05/31/2013 1412   BILIRUBINUR n 08/10/2012 1131   KETONESUR NEGATIVE 05/31/2013 1412   PROTEINUR 30 (A) 05/31/2013 1412   UROBILINOGEN 0.2 05/31/2013 1412   NITRITE NEGATIVE 05/31/2013 1412   LEUKOCYTESUR NEGATIVE 05/31/2013 1412   Sepsis Labs: @LABRCNTIP (procalcitonin:4,lacticidven:4) ) Recent Results (from the past 240 hour(s))  Culture, blood (routine x 2)     Status: None (Preliminary result)   Collection Time: 10/28/18  5:08 PM  Result Value Ref Range Status   Specimen Description BLOOD RIGHT ANTECUBITAL  Final   Special Requests   Final    BOTTLES DRAWN AEROBIC AND ANAEROBIC Blood Culture adequate volume   Culture   Final    NO GROWTH 4 DAYS Performed at [facility],  [address], [place], [place]    Report Status PENDING  Incomplete  Culture, blood (routine x 2)     Status: None (Preliminary result)   Collection Time: 10/28/18  5:08 PM  Result Value Ref Range Status   Specimen Description BLOOD RIGHT FOREARM  Final   Special Requests   Final    BOTTLES DRAWN AEROBIC AND ANAEROBIC Blood Culture adequate volume   Culture   Final    NO GROWTH 4 DAYS Performed at [facility], [address] [place], [place]    Report Status PENDING  Incomplete     Radiological Exams on Admission: Dg Chest Portable 1 View  Result Date: 11/01/2018 CLINICAL DATA:  Shortness of breath.  Recent episode of pneumonia. EXAM: PORTABLE CHEST 1 VIEW COMPARISON:  PA and lateral chest x-ray of October 30, 2018 as well as chest CT scan of October 28, 2018 and PA and lateral chest x-ray of August 31, 2018. FINDINGS: The lungs are mildly hypoinflated. The interstitial markings remain increased bilaterally greatest at the left lung base. There may be a small left pleural effusion. The heart is top-normal in size. The pulmonary vascularity is indistinct. The patient has undergone previous CABG. There is calcification in the mitral valvular annulus. The observed bony thorax is unremarkable. IMPRESSION: Persistent increased interstitial densities bilaterally greatest at the left lung base. This may reflect pneumonia or mild pulmonary edema or a combination of the same. The appearance of both lungs has not greatly changed since yesterday's study but the appearance of the lungs has deteriorated considerably since an August 31, 2018 study. Electronically Signed   By: David  [place] M.D.   On: 11/01/2018 13:40    EKG: Independently reviewed. Sinus tach at 89 bpm  Assessment/Plan Principal Problem:   COPD stage 3 with acute exacerbation , acute on chronic hypoxic respiratory failure H/ ARDS in the past -on 2 L O2 at baseline- currently requiring 4 L - cont IV  steroids and Nebs- will give her another neb treatment now as she is still wheezing - add Mucinex, IS and flutter valve - ED has given her 1 dose of IV Lasix 80 mg- I will resume her home dose of Lasix (has only grade 1 d CHF per recent ECHO on 10/19) - she will likely need a Neb machine at home as she does not have one - follows with Dr Sherene Sires   Active Problems: CAP (community acquired pneumonia)/ - multifocal, bilateral infiltrates - cont treatment with Levaquin- it appears to be improving- today is day 5 of antibiotics    Hypothyroidism Synthroid-     Essential hypertension  - cont ARB and Lasix    GERD (gastroesophageal reflux disease)-  Barrett's esophagus -cont PPI    Morbid (severe) obesity due to excess calories (HCC) Body mass index is 40.4 kg/m.  - needs to lose weight   CAD- S/P CABG x 3 - cont Lipitor    DVT prophylaxis:   Lovenox Code Status: DNR- she states her daughter wants her to be a full code but she herself wants DNR  Family Communication: daughter  Disposition Plan: med/sur  Consults called: none  Admission status: observation- resume HHPT when she is discharged- she should not need more than this - she did not need any assistance getting out of bed to the bedside commode in the ED    Calvert Cantor MD [facility] Pager: [email or web address] Password [email or web address] 7PM-7AM, please contact night-coverage   11/01/2018, 6:01 PM

## 2018-11-01 NOTE — ED Notes (Signed)
Attempted to ambulate pt using walker.  Pt was able to make it 5 feet before sats dropped to 75% on 3L.  MD notified.

## 2018-11-02 ENCOUNTER — Other Ambulatory Visit: Payer: Self-pay

## 2018-11-02 ENCOUNTER — Encounter (HOSPITAL_COMMUNITY): Payer: Self-pay

## 2018-11-02 DIAGNOSIS — J449 Chronic obstructive pulmonary disease, unspecified: Secondary | ICD-10-CM | POA: Diagnosis present

## 2018-11-02 DIAGNOSIS — J45991 Cough variant asthma: Secondary | ICD-10-CM | POA: Diagnosis present

## 2018-11-02 DIAGNOSIS — J440 Chronic obstructive pulmonary disease with acute lower respiratory infection: Secondary | ICD-10-CM | POA: Diagnosis present

## 2018-11-02 DIAGNOSIS — J189 Pneumonia, unspecified organism: Secondary | ICD-10-CM | POA: Diagnosis present

## 2018-11-02 DIAGNOSIS — E785 Hyperlipidemia, unspecified: Secondary | ICD-10-CM | POA: Diagnosis present

## 2018-11-02 DIAGNOSIS — I252 Old myocardial infarction: Secondary | ICD-10-CM | POA: Diagnosis not present

## 2018-11-02 DIAGNOSIS — J9612 Chronic respiratory failure with hypercapnia: Secondary | ICD-10-CM | POA: Diagnosis present

## 2018-11-02 DIAGNOSIS — R0602 Shortness of breath: Secondary | ICD-10-CM | POA: Diagnosis not present

## 2018-11-02 DIAGNOSIS — K2270 Barrett's esophagus without dysplasia: Secondary | ICD-10-CM | POA: Diagnosis present

## 2018-11-02 DIAGNOSIS — L4050 Arthropathic psoriasis, unspecified: Secondary | ICD-10-CM | POA: Diagnosis present

## 2018-11-02 DIAGNOSIS — Z9049 Acquired absence of other specified parts of digestive tract: Secondary | ICD-10-CM | POA: Diagnosis not present

## 2018-11-02 DIAGNOSIS — I491 Atrial premature depolarization: Secondary | ICD-10-CM | POA: Diagnosis present

## 2018-11-02 DIAGNOSIS — I5032 Chronic diastolic (congestive) heart failure: Secondary | ICD-10-CM | POA: Diagnosis present

## 2018-11-02 DIAGNOSIS — I2510 Atherosclerotic heart disease of native coronary artery without angina pectoris: Secondary | ICD-10-CM | POA: Diagnosis present

## 2018-11-02 DIAGNOSIS — J441 Chronic obstructive pulmonary disease with (acute) exacerbation: Secondary | ICD-10-CM

## 2018-11-02 DIAGNOSIS — Z96653 Presence of artificial knee joint, bilateral: Secondary | ICD-10-CM | POA: Diagnosis present

## 2018-11-02 DIAGNOSIS — Z6841 Body Mass Index (BMI) 40.0 and over, adult: Secondary | ICD-10-CM | POA: Diagnosis not present

## 2018-11-02 DIAGNOSIS — J8410 Pulmonary fibrosis, unspecified: Secondary | ICD-10-CM | POA: Diagnosis present

## 2018-11-02 DIAGNOSIS — E039 Hypothyroidism, unspecified: Secondary | ICD-10-CM | POA: Diagnosis present

## 2018-11-02 DIAGNOSIS — K219 Gastro-esophageal reflux disease without esophagitis: Secondary | ICD-10-CM | POA: Diagnosis present

## 2018-11-02 DIAGNOSIS — I110 Hypertensive heart disease with heart failure: Secondary | ICD-10-CM | POA: Diagnosis present

## 2018-11-02 DIAGNOSIS — Z9981 Dependence on supplemental oxygen: Secondary | ICD-10-CM | POA: Diagnosis not present

## 2018-11-02 DIAGNOSIS — I4519 Other right bundle-branch block: Secondary | ICD-10-CM | POA: Diagnosis present

## 2018-11-02 DIAGNOSIS — J9621 Acute and chronic respiratory failure with hypoxia: Secondary | ICD-10-CM | POA: Diagnosis present

## 2018-11-02 DIAGNOSIS — Z951 Presence of aortocoronary bypass graft: Secondary | ICD-10-CM | POA: Diagnosis not present

## 2018-11-02 LAB — CBC
HCT: 30.5 % — ABNORMAL LOW (ref 36.0–46.0)
Hemoglobin: 9 g/dL — ABNORMAL LOW (ref 12.0–15.0)
MCH: 28.5 pg (ref 26.0–34.0)
MCHC: 29.5 g/dL — ABNORMAL LOW (ref 30.0–36.0)
MCV: 96.5 fL (ref 80.0–100.0)
Platelets: 281 10*3/uL (ref 150–400)
RBC: 3.16 MIL/uL — ABNORMAL LOW (ref 3.87–5.11)
RDW: 14.6 % (ref 11.5–15.5)
WBC: 8 10*3/uL (ref 4.0–10.5)
nRBC: 0 % (ref 0.0–0.2)

## 2018-11-02 LAB — CULTURE, BLOOD (ROUTINE X 2)
Culture: NO GROWTH
Culture: NO GROWTH
Special Requests: ADEQUATE
Special Requests: ADEQUATE

## 2018-11-02 LAB — BASIC METABOLIC PANEL
Anion gap: 9 (ref 5–15)
BUN: 23 mg/dL (ref 8–23)
CO2: 49 mmol/L — ABNORMAL HIGH (ref 22–32)
Calcium: 9.4 mg/dL (ref 8.9–10.3)
Chloride: 85 mmol/L — ABNORMAL LOW (ref 98–111)
Creatinine, Ser: 1.15 mg/dL — ABNORMAL HIGH (ref 0.44–1.00)
GFR calc Af Amer: 55 mL/min — ABNORMAL LOW (ref >60)
GFR calc non Af Amer: 48 mL/min — ABNORMAL LOW (ref >60)
Glucose, Bld: 137 mg/dL — ABNORMAL HIGH (ref 70–99)
Potassium: 4.1 mmol/L (ref 3.5–5.1)
Sodium: 143 mmol/L (ref 135–145)

## 2018-11-02 LAB — GLUCOSE, CAPILLARY: Glucose-Capillary: 118 mg/dL — ABNORMAL HIGH (ref 70–99)

## 2018-11-02 MED ORDER — IPRATROPIUM-ALBUTEROL 0.5-2.5 (3) MG/3ML IN SOLN
3.0000 mL | Freq: Three times a day (TID) | RESPIRATORY_TRACT | Status: DC
  Administered 2018-11-03: 3 mL via RESPIRATORY_TRACT
  Filled 2018-11-02: qty 3

## 2018-11-02 MED ORDER — IPRATROPIUM-ALBUTEROL 0.5-2.5 (3) MG/3ML IN SOLN
3.0000 mL | Freq: Three times a day (TID) | RESPIRATORY_TRACT | Status: DC
  Administered 2018-11-02: 3 mL via RESPIRATORY_TRACT
  Filled 2018-11-02: qty 3

## 2018-11-02 MED ORDER — IPRATROPIUM-ALBUTEROL 0.5-2.5 (3) MG/3ML IN SOLN
3.0000 mL | Freq: Four times a day (QID) | RESPIRATORY_TRACT | Status: DC
  Administered 2018-11-02 (×2): 3 mL via RESPIRATORY_TRACT
  Filled 2018-11-02 (×2): qty 3

## 2018-11-02 MED ORDER — BUDESONIDE 0.25 MG/2ML IN SUSP
0.2500 mg | Freq: Two times a day (BID) | RESPIRATORY_TRACT | Status: DC
  Administered 2018-11-02 – 2018-11-04 (×4): 0.25 mg via RESPIRATORY_TRACT
  Filled 2018-11-02 (×4): qty 2

## 2018-11-02 MED ORDER — LEVOFLOXACIN 750 MG PO TABS
750.0000 mg | ORAL_TABLET | ORAL | Status: DC
  Administered 2018-11-04: 750 mg via ORAL
  Filled 2018-11-02: qty 1

## 2018-11-02 NOTE — H&P (Signed)
History and Physical    [name]  YWV:[id number]  DOB: 12/27/1945  DOA: 11/01/2018 PCP: [name], [name], MD   Patient coming from: home  Chief Complaint: shortness of breath  HPI: [name] is a 72 y.o. female with medical history of  COPD Gold stage 3 on 2 l O2 at baseline, GERD, HTN, CAD/CABG, morbid obesity who was too short of breath today to ambulate a short distance to her bathroom. She was recently admitted for pneumonia and was otherwise doing fine. Her sputum has changed from dark to a light tan and she had no fevers. She has no other complaints.   ED Course: noted to require 4 L to keep pulse ox > 90%, drops to 73% on exertion. After Nebs and IV steroids, she state she still does not feel better.  Review of Systems:  All other systems reviewed and apart from HPI, are negative.  Past Medical History:  Diagnosis Date  . Acute bronchitis 09/25/2015  . ACUTE ON CHRONIC DIASTOLIC HEART FAILURE 12/04/2010   Qualifier: Diagnosis of  By: Ladona Ridgel, MD, [facility], [place]ergia Alcon   . Arthritis    OA RIGHT KNEE WITH PAIN  . Barrett esophagus   . Bradycardia 06/01/2015  . CHF (congestive heart failure) (HCC)   . Chronic respiratory failure (HCC)   . Chronic respiratory failure with hypoxia and hypercapnia (HCC) 02/04/2010   Followed in Pulmonary clinic/ [facility]/ [facility]       - 02 dependent  since 07/02/10 >>  83% RA December 05, 2010       - ONO RA 08/05/12  :  Positive sat < 89 x 2:67m> repeat on 2lpm rec 08/12/2012  - 06/17/2013 reported desat with activity p Knee surgery > rec restart 2lpm with activity  - 06/27/2013   Walked 2lpm  x one lap @ 185 stopped due to sat 88% not sob , desat to 82% on RA just at th  . COPD (chronic obstructive pulmonary disease) (HCC)   . COPD III spirometry if use FEV1/VC p saba  07/18/2010   Quit smoking May 2006       - PFT's  04/12/10 FEV1  1.21 (69%) ratio 77 and no change p B2,  DLC0 56%   VC 70%         - PFTs  08/08/2013  FEV1 1.21 (60%) ratio 86 and no change p B2 DLCO 79%  VC 72%  On symbicort 160 2bid  - PFT's  02/08/2018  FEV1 0.70 (40 % ) ratio 56 if use FEV1/VC  p 38 % improvement from saba p symb 160 prior to study with DLCO  78 % corrects to 147  % for alv volume   - 02/08/2018  . Cough variant asthma 02/26/2011   Followed in Pulmonary clinic/ [facility]  - PFT's  06/04/15  FEV1 1.20 (67 % ) ratio 83  p 6 % improvement from saba with DLCO  80 % corrects to 132 % for alv volume      - Clinical dx based on response to symbicort       FENO 09/16/2016  =   96 on symbicort 160 2bid > added singulair  Allergy profile 09/16/2016 >  Eos 0.5 /  IgE  78 neg RAST  -  Referred to rehab 04/29/2017 > completed  . DOE (dyspnea on exertion) 02/19/2016  . Essential hypertension 04/20/2007   Qualifier: Diagnosis of  By: Marcelyn Ditty RN, Katy Fitch   .  GERD (gastroesophageal reflux disease)   . History of ARDS 2006  . History of home oxygen therapy    AT NIGHT WHEN SLEEPING 2 L / MIN NASAL CANNULA  . Hyperlipidemia 07/12/2015  . Hypertension   . Hypothyroidism   . Morbid (severe) obesity due to excess calories (HCC) 04/22/2015   pfts with erv 14% 06/04/15  And 33% 02/08/2018   . NSTEMI (non-ST elevated myocardial infarction) (HCC) 05/31/2015  . Pneumococcal pneumonia (HCC) 2006   HOSPITALIZED AND DEVELOPED ARDS  . Psoriatic arthritis (HCC)   . Pulmonary fibrosis (HCC)   . PULMONARY FIBROSIS ILD POST INFLAMMATORY CHRONIC 07/18/2010   Followed as Primary Care Patient/ [facility]/ [facility]  -s/p ARDS 2006 with bacteremic S  Pna       - CT chest 07/03/10 Nonspecific PF mostly upper lobes       - CT chest 12/03/10 acute gg changes and effusions c/w chf - PFT's  02/08/2018  FVC 0.64 (28 %)   with DLCO  78 % corrects to 147 % for alv volume     . Rheumatic disease   . 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 SOB (shortness of breath) on exertion   . Stroke [facility])     Past Surgical History:  Procedure Laterality Date  . ABDOMINOPLASTY    . CARDIAC  CATHETERIZATION N/A 06/01/2015   Procedure: Left Heart Cath and Coronary Angiography;  Surgeon: Runell Gess, MD;  Location: [facility] INVASIVE CV LAB;  Service: Cardiovascular;  Laterality: N/A;  . CARPAL TUNNEL RELEASE    . CHOLECYSTECTOMY    . CORONARY ARTERY BYPASS GRAFT N/A 06/04/2015   Procedure: CORONARY ARTERY BYPASS GRAFT times three            with left internal mammary artery and right leg saphenous vein;  Surgeon: Alleen Borne, MD;  Location: [facility] OR;  Service: Open Heart Surgery;  Laterality: N/A;  . cosmetic breast surgery    . JOINT REPLACEMENT    . KNEE ARTHROSCOPY Left   . TEE WITHOUT CARDIOVERSION  06/04/2015   Procedure: TRANSESOPHAGEAL ECHOCARDIOGRAM (TEE);  Surgeon: Alleen Borne, MD;  Location: [facility] OR;  Service: Open Heart Surgery;;  . TOTAL KNEE ARTHROPLASTY Left   . TOTAL KNEE ARTHROPLASTY Right 06/06/2013   Procedure: RIGHT TOTAL KNEE ARTHROPLASTY;  Surgeon: Loanne Drilling, MD;  Location: [facility] ORS;  Service: Orthopedics;  Laterality: Right;    Social History:   reports that she quit smoking about 13 years ago. Her smoking use included cigarettes. She has a 87.75 pack-year smoking history. She has never used smokeless tobacco. She reports that she does not drink alcohol or use drugs. She lives alone at home  No Known Allergies  Family History  Problem Relation Age of Onset  . Breast cancer Mother   . Coronary artery disease Father   . Rheum arthritis Father      Prior to Admission medications   Medication Sig Start Date End Date Taking? Authorizing Provider  acetaminophen (TYLENOL ARTHRITIS PAIN) 650 MG CR tablet Take 1,300 mg by mouth 2 (two) times daily.    Yes [provider]  albuterol (PROAIR HFA) 108 (90 Base) MCG/ACT inhaler 2 puffs every 4 hours as needed only  if your can't catch your breath Patient taking differently: Inhale 2 puffs into the lungs every 4 (four) hours as needed for shortness of breath (if you can't catch your breath).  07/22/16  Yes  Nyoka Cowden, MD  atorvastatin (LIPITOR) 40  MG tablet Take 1 tablet (40 mg total) by mouth daily at 6 PM. 10/15/18  Yes Lars Masson, MD  Cholecalciferol (VITAMIN D3) 2000 UNITS TABS Take 2,000 Int'l Units by mouth daily.   Yes [provider]  citalopram (CELEXA) 40 MG tablet TAKE 1 TABLET BY MOUTH  EVERY MORNING Patient taking differently: Take 40 mg by mouth daily.  09/14/18  Yes Roderick Pee, MD  furosemide (LASIX) 20 MG tablet TAKE 1 TABLET BY MOUTH TWO  TIMES DAILY Patient taking differently: Take 20 mg by mouth 2 (two) times daily.  03/30/18  Yes Lars Masson, MD  golimumab (SIMPONI ARIA) 50 MG/4ML SOLN injection Inject 50 mg into the vein every 8 (eight) weeks.    Yes [provider]  levofloxacin (LEVAQUIN) 750 MG tablet Take 1 tablet (750 mg total) by mouth daily for 4 days. 10/30/18 11/03/18 Yes Leroy Sea, MD  levothyroxine (SYNTHROID, LEVOTHROID) 150 MCG tablet TAKE 1 TABLET BY MOUTH  DAILY BEFORE BREAKFAST Patient taking differently: Take 150 mcg by mouth daily before breakfast.  01/15/18  Yes Lars Masson, MD  losartan (COZAAR) 50 MG tablet Take 1 tablet (50 mg total) by mouth daily. 10/15/18  Yes Lars Masson, MD  montelukast (SINGULAIR) 10 MG tablet TAKE 1 TABLET BY MOUTH AT  BEDTIME Patient taking differently: Take 10 mg by mouth at bedtime.  09/14/18  Yes Nyoka Cowden, MD  OXYGEN Inhale 2 L into the lungs continuous. continuous o2    Yes [provider]  pantoprazole (PROTONIX) 40 MG tablet Take 1 tablet (40 mg total) by mouth daily before breakfast. 09/14/18  Yes Nyoka Cowden, MD  SYMBICORT 160-4.5 MCG/ACT inhaler USE 2 PUFFS TWO TIMES DAILY Patient taking differently: Inhale 2 puffs into the lungs 2 (two) times daily.  04/01/18  Yes Lars Masson, MD  traMADol (ULTRAM) 50 MG tablet TAKE 1-2 TABLETS EVERY 6 HOURS AS NEEDED FOR PAIN Patient taking differently: Take 50 mg by mouth 2 (two) times daily.  07/15/17   Yes Gordy Savers, MD  triamcinolone cream (KENALOG) 0.1 % Apply 1 application topically 2 (two) times daily as needed (for psoriasis).    Yes [provider]    Physical Exam: Wt Readings from Last 3 Encounters:  11/01/18 90.7 kg  10/22/18 97.7 kg  10/11/18 94.3 kg   Vitals:   11/02/18 0901 11/02/18 1100 11/02/18 1351 11/02/18 1721  BP: 138/65 (!) 160/74  (!) 132/99  Pulse:  86  90  Resp: 18 17  16   Temp:  98.1 F (36.7 C)  99.1 F (37.3 C)  TempSrc:  Oral  Oral  SpO2: 96% 96% 99% 94%  Weight:      Height:          Constitutional:  Calm & comfortable Eyes: PERRLA, lids and conjunctivae normal ENT:  Mucous membranes are moist.  Pharynx clear of exudate   Normal dentition.  Neck: Supple, no masses  Respiratory:  Bilateral wheezing, RR in 20s- pulse ox 92% on 4 L  Cardiovascular:  S1 & S2 heard, regular rate and rhythm, tachycardic No Murmurs Abdomen:  Non distended No tenderness, No masses Bowel sounds normal Extremities:  No clubbing / cyanosis No pedal edema No joint deformity    Skin:  No rashes, lesions or ulcers Neurologic:  AAO x 3 CN 2-12 grossly intact Sensation intact Strength 5/5 in all 4 extremities Psychiatric:  Normal Mood and affect    Labs on Admission:  I have personally reviewed following labs and imaging studies  CBC: Recent Labs  Lab 10/28/18 1400 10/29/18 0257 10/30/18 0903 11/01/18 1313 11/02/18 0658  WBC 15.3* 14.6* 10.4 9.7 8.0  NEUTROABS 14.2*  --   --   --   --   HGB 9.3* 8.6* 8.8* 9.0* 9.0*  HCT 32.9* 29.5* 30.9* 32.1* 30.5*  MCV 98.8 98.3 98.1 101.3* 96.5  PLT 189 190 206 246 281   Basic Metabolic Panel: Recent Labs  Lab 10/28/18 1400 10/29/18 0257 10/30/18 0903 11/01/18 1313 11/02/18 0658  NA 140 136 139 142 143  K 4.2 3.6 4.0 4.3 4.1  CL 94* 90* 95* 91* 85*  CO2 36* 36* 37* 41* 49*  GLUCOSE 121* 134* 130* 114* 137*  BUN 34* 38* 32* 21 23  CREATININE 1.28* 1.39* 1.12* 0.98 1.15*    CALCIUM 8.9 8.4* 8.9 9.5 9.4   GFR: Estimated Creatinine Clearance: 44.1 mL/min (A) (by C-G formula based on SCr of 1.15 mg/dL (H)). Liver Function Tests: Recent Labs  Lab 10/28/18 1400  AST 20  ALT 17  ALKPHOS 44  BILITOT 0.7  PROT 6.6  ALBUMIN 3.0*   No results for input(s): LIPASE, AMYLASE in the last 168 hours. No results for input(s): AMMONIA in the last 168 hours. Coagulation Profile: No results for input(s): INR, PROTIME in the last 168 hours. Cardiac Enzymes: No results for input(s): CKTOTAL, CKMB, CKMBINDEX, TROPONINI in the last 168 hours. BNP (last 3 results) No results for input(s): PROBNP in the last 8760 hours. HbA1C: No results for input(s): HGBA1C in the last 72 hours. CBG: Recent Labs  Lab 11/02/18 0624  GLUCAP 118*   Lipid Profile: No results for input(s): CHOL, HDL, LDLCALC, TRIG, CHOLHDL, LDLDIRECT in the last 72 hours. Thyroid Function Tests: No results for input(s): TSH, T4TOTAL, FREET4, T3FREE, THYROIDAB in the last 72 hours. Anemia Panel: No results for input(s): VITAMINB12, FOLATE, FERRITIN, TIBC, IRON, RETICCTPCT in the last 72 hours. Urine analysis:    Component Value Date/Time   COLORURINE YELLOW 05/31/2013 1412   APPEARANCEUR CLEAR 05/31/2013 1412   LABSPEC 1.027 05/31/2013 1412   PHURINE 5.0 05/31/2013 1412   GLUCOSEU NEGATIVE 05/31/2013 1412   HGBUR NEGATIVE 05/31/2013 1412   HGBUR negative 07/18/2010 0941   BILIRUBINUR SMALL (A) 05/31/2013 1412   BILIRUBINUR n 08/10/2012 1131   KETONESUR NEGATIVE 05/31/2013 1412   PROTEINUR 30 (A) 05/31/2013 1412   UROBILINOGEN 0.2 05/31/2013 1412   NITRITE NEGATIVE 05/31/2013 1412   LEUKOCYTESUR NEGATIVE 05/31/2013 1412   Sepsis Labs: @LABRCNTIP (procalcitonin:4,lacticidven:4) ) Recent Results (from the past 240 hour(s))  Culture, blood (routine x 2)     Status: None   Collection Time: 10/28/18  5:08 PM  Result Value Ref Range Status   Specimen Description BLOOD RIGHT ANTECUBITAL  Final    Special Requests   Final    BOTTLES DRAWN AEROBIC AND ANAEROBIC Blood Culture adequate volume   Culture   Final    NO GROWTH 5 DAYS Performed at [facility], [address] [place], [place] [address]    Report Status 11/02/2018 FINAL  Final  Culture, blood (routine x 2)     Status: None   Collection Time: 10/28/18  5:08 PM  Result Value Ref Range Status   Specimen Description BLOOD RIGHT FOREARM  Final   Special Requests   Final    BOTTLES DRAWN AEROBIC AND ANAEROBIC Blood Culture adequate volume   Culture   Final    NO GROWTH 5  DAYS Performed at [facility], [address], [place], [place] [address]    Report Status 11/02/2018 FINAL  Final     Radiological Exams on Admission: Dg Chest Portable 1 View  Result Date: 11/01/2018 CLINICAL DATA:  Shortness of breath.  Recent episode of pneumonia. EXAM: PORTABLE CHEST 1 VIEW COMPARISON:  PA and lateral chest x-ray of October 30, 2018 as well as chest CT scan of October 28, 2018 and PA and lateral chest x-ray of August 31, 2018. FINDINGS: The lungs are mildly hypoinflated. The interstitial markings remain increased bilaterally greatest at the left lung base. There may be a small left pleural effusion. The heart is top-normal in size. The pulmonary vascularity is indistinct. The patient has undergone previous CABG. There is calcification in the mitral valvular annulus. The observed bony thorax is unremarkable. IMPRESSION: Persistent increased interstitial densities bilaterally greatest at the left lung base. This may reflect pneumonia or mild pulmonary edema or a combination of the same. The appearance of both lungs has not greatly changed since yesterday's study but the appearance of the lungs has deteriorated considerably since an August 31, 2018 study. Electronically Signed   By: David  [place] M.D.   On: 11/01/2018 13:40    EKG: Independently reviewed. Sinus tach at 89 bpm  Assessment/Plan Principal Problem:   COPD  stage 3 with acute exacerbation , acute on chronic hypoxic respiratory failure H/ ARDS in the past -on 2 L O2 at baseline- currently requiring 4 L - cont IV steroids and Nebs- will give her another neb treatment now as she is still wheezing - add Mucinex, IS and flutter valve - ED has given her 1 dose of IV Lasix 80 mg- I will resume her home dose of Lasix (has only grade 1 d CHF per recent ECHO on 10/19) - she will likely need a Neb machine at home as she does not have one - follows with Dr Sherene Sires   Active Problems: CAP (community acquired pneumonia)/ - multifocal, bilateral infiltrates - cont treatment with Levaquin- it appears to be improving- today is day 5 of antibiotics    Hypothyroidism Synthroid-     Essential hypertension  - cont ARB and Lasix    GERD (gastroesophageal reflux disease)-  Barrett's esophagus -cont PPI    Morbid (severe) obesity due to excess calories (HCC) Body mass index is 40.4 kg/m.  - needs to lose weight   CAD- S/P CABG x 3 - cont Lipitor    DVT prophylaxis:   Lovenox Code Status: DNR- she states her daughter wants her to be a full code but she herself wants DNR  Family Communication: daughter  Disposition Plan: med/sur  Consults called: none  Admission status: observation- resume HHPT when she is discharged- she should not need more than this - she did not need any assistance getting out of bed to the bedside commode in the ED    Calvert Cantor MD [facility] Pager: [email or web address] Password [email or web address] 7PM-7AM, please contact night-coverage   11/02/2018, 5:48 PM

## 2018-11-02 NOTE — Progress Notes (Signed)
PROGRESS NOTE    [name]  XNT:[id number] DOB: 1946-11-01 DOA: 11/01/2018 PCP: [name], [name], MD   Brief Narrative:  Per HPI: [name] is a 72 y.o. female with medical history of  COPD Gold stage 3 on 2 l O2 at baseline, GERD, HTN, CAD/CABG, morbid obesity who was too short of breath today to ambulate a short distance to her bathroom. She was recently admitted for pneumonia and was otherwise doing fine. Her sputum has changed from dark to a light tan and she had no fevers. She has no other complaints.   She was admitted with acute COPD exacerbation as well as acute on chronic hypoxemic respiratory failure with recent discharge on 12/28 for community-acquired pneumonia.  Assessment & Plan:   Principal Problem:   COPD with acute exacerbation (HCC) Active Problems:   Hypothyroidism   Essential hypertension   PULMONARY FIBROSIS ILD POST INFLAMMATORY CHRONIC   GERD (gastroesophageal reflux disease)   Barrett's esophagus   Morbid (severe) obesity due to excess calories (HCC)   S/P CABG x 3   CAP (community acquired pneumonia)   1. Acute on chronic hypoxemic respiratory failure secondary to AECOPD.  Continue IV methylprednisolone and add Pulmicort as well as incentive spirometry and up to chair.  Duo nebs every 6 hours.  Continue Mucinex, and flutter valve.  Will likely need home nebulizer machine prior to discharge to assist with symptom control. 2. Multifocal community-acquired pneumonia.  Continue treatment with Levaquin with dosing per pharmacy. 3. Hypothyroidism.  Continue Synthroid. 4. Essential hypertension.  Continue [place] Lasix. 5. Barrett's esophagus/GERD.  Continue PPI. 6. Morbid obesity. 7. CAD status post CABG x3.  Continue Lipitor.   DVT prophylaxis: Lovenox Code Status: Currently full code, but considering DNR Family Communication: Daughter at bedside Disposition Plan: Continue treatment of COPD exacerbation as well as hypoxemic  respiratory failure with slow improvement noted.  Clinically patient does not feel back to baseline as of yet and may be ready for discharge in the next 24 to 48 hours.   Consultants:   None  Procedures:   None  Antimicrobials:   Has completed 5 days of oral Levaquin and has been placed on every 48 hour dosing per pharmacy   Subjective: Patient seen and evaluated today with no new acute complaints or concerns.  She continues to have some ongoing wheezing and chest tightness.  She did become mildly hypoxemic with ambulation despite being on 3 L nasal cannula.  She does not feel as though she is back to her usual baseline.  Objective: Vitals:   11/02/18 0617 11/02/18 0723 11/02/18 0901 11/02/18 1100  BP: (!) 145/88  138/65 (!) 160/74  Pulse: 93   86  Resp: 20  18 17   Temp: 98.3 F (36.8 C)   98.1 F (36.7 C)  TempSrc: Oral   Oral  SpO2: 94% 98% 96% 96%  Weight:      Height:        Intake/Output Summary (Last 24 hours) at 11/02/2018 1256 Last data filed at 11/02/2018 0300 Gross per 24 hour  Intake 480 ml  Output 1400 ml  Net -920 ml   Filed Weights   11/01/18 1309  Weight: 90.7 kg    Examination:  General exam: Appears calm and comfortable, obese Respiratory system: Clear to auscultation. Respiratory effort normal.  Currently on 2.5 L nasal cannula.  Has minimal wheezing to bilateral lung bases. Cardiovascular system: S1 & S2 heard, RRR. No JVD, murmurs, rubs, gallops or  clicks. No pedal edema. Gastrointestinal system: Abdomen is nondistended, soft and nontender. No organomegaly or masses felt. Normal bowel sounds heard. Central nervous system: Alert and oriented. No focal neurological deficits. Extremities: Symmetric 5 x 5 power. Skin: No rashes, lesions or ulcers Psychiatry: Judgement and insight appear normal. Mood & affect appropriate.     Data Reviewed: I have personally reviewed following labs and imaging studies  CBC: Recent Labs  Lab 10/28/18 1400  10/29/18 0257 10/30/18 0903 11/01/18 1313 11/02/18 0658  WBC 15.3* 14.6* 10.4 9.7 8.0  NEUTROABS 14.2*  --   --   --   --   HGB 9.3* 8.6* 8.8* 9.0* 9.0*  HCT 32.9* 29.5* 30.9* 32.1* 30.5*  MCV 98.8 98.3 98.1 101.3* 96.5  PLT 189 190 206 246 281   Basic Metabolic Panel: Recent Labs  Lab 10/28/18 1400 10/29/18 0257 10/30/18 0903 11/01/18 1313 11/02/18 0658  NA 140 136 139 142 143  K 4.2 3.6 4.0 4.3 4.1  CL 94* 90* 95* 91* 85*  CO2 36* 36* 37* 41* 49*  GLUCOSE 121* 134* 130* 114* 137*  BUN 34* 38* 32* 21 23  CREATININE 1.28* 1.39* 1.12* 0.98 1.15*  CALCIUM 8.9 8.4* 8.9 9.5 9.4   GFR: Estimated Creatinine Clearance: 44.1 mL/min (A) (by C-G formula based on SCr of 1.15 mg/dL (H)). Liver Function Tests: Recent Labs  Lab 10/28/18 1400  AST 20  ALT 17  ALKPHOS 44  BILITOT 0.7  PROT 6.6  ALBUMIN 3.0*   No results for input(s): LIPASE, AMYLASE in the last 168 hours. No results for input(s): AMMONIA in the last 168 hours. Coagulation Profile: No results for input(s): INR, PROTIME in the last 168 hours. Cardiac Enzymes: No results for input(s): CKTOTAL, CKMB, CKMBINDEX, TROPONINI in the last 168 hours. BNP (last 3 results) No results for input(s): PROBNP in the last 8760 hours. HbA1C: No results for input(s): HGBA1C in the last 72 hours. CBG: Recent Labs  Lab 11/02/18 0624  GLUCAP 118*   Lipid Profile: No results for input(s): CHOL, HDL, LDLCALC, TRIG, CHOLHDL, LDLDIRECT in the last 72 hours. Thyroid Function Tests: No results for input(s): TSH, T4TOTAL, FREET4, T3FREE, THYROIDAB in the last 72 hours. Anemia Panel: No results for input(s): VITAMINB12, FOLATE, FERRITIN, TIBC, IRON, RETICCTPCT in the last 72 hours. Sepsis Labs: No results for input(s): PROCALCITON, LATICACIDVEN in the last 168 hours.  Recent Results (from the past 240 hour(s))  Culture, blood (routine x 2)     Status: None   Collection Time: 10/28/18  5:08 PM  Result Value Ref Range Status    Specimen Description BLOOD RIGHT ANTECUBITAL  Final   Special Requests   Final    BOTTLES DRAWN AEROBIC AND ANAEROBIC Blood Culture adequate volume   Culture   Final    NO GROWTH 5 DAYS Performed at [facility], [address], [place], [place] [address]    Report Status 11/02/2018 FINAL  Final  Culture, blood (routine x 2)     Status: None   Collection Time: 10/28/18  5:08 PM  Result Value Ref Range Status   Specimen Description BLOOD RIGHT FOREARM  Final   Special Requests   Final    BOTTLES DRAWN AEROBIC AND ANAEROBIC Blood Culture adequate volume   Culture   Final    NO GROWTH 5 DAYS Performed at [facility], [address], [place], [place] [address]    Report Status 11/02/2018 FINAL  Final  Radiology Studies: Dg Chest Portable 1 View  Result Date: 11/01/2018 CLINICAL DATA:  Shortness of breath.  Recent episode of pneumonia. EXAM: PORTABLE CHEST 1 VIEW COMPARISON:  PA and lateral chest x-ray of October 30, 2018 as well as chest CT scan of October 28, 2018 and PA and lateral chest x-ray of August 31, 2018. FINDINGS: The lungs are mildly hypoinflated. The interstitial markings remain increased bilaterally greatest at the left lung base. There may be a small left pleural effusion. The heart is top-normal in size. The pulmonary vascularity is indistinct. The patient has undergone previous CABG. There is calcification in the mitral valvular annulus. The observed bony thorax is unremarkable. IMPRESSION: Persistent increased interstitial densities bilaterally greatest at the left lung base. This may reflect pneumonia or mild pulmonary edema or a combination of the same. The appearance of both lungs has not greatly changed since yesterday's study but the appearance of the lungs has deteriorated considerably since an August 31, 2018 study. Electronically Signed   By: David  [place] M.D.   On: 11/01/2018 13:40        Scheduled Meds: . albuterol  2.5 mg  Nebulization STAT  . atorvastatin  40 mg Oral q1800  . budesonide (PULMICORT) nebulizer solution  0.25 mg Nebulization BID  . citalopram  40 mg Oral q morning - 10a  . enoxaparin (LOVENOX) injection  40 mg Subcutaneous Q24H  . furosemide  20 mg Oral BID  . guaiFENesin  600 mg Oral BID  . ipratropium-albuterol  3 mL Nebulization Q6H  . [START ON 11/04/2018] levofloxacin  750 mg Oral Q48H  . levothyroxine  150 mcg Oral QAC breakfast  . losartan  50 mg Oral Daily  . methylPREDNISolone (SOLU-MEDROL) injection  40 mg Intravenous Q12H  . montelukast  10 mg Oral QHS  . pantoprazole  40 mg Oral QAC breakfast  . sodium chloride flush  3 mL Intravenous Q12H  . traMADol  50 mg Oral BID   Continuous Infusions: . sodium chloride       LOS: 0 days    Time spent: 30 minutes    Pratik Hoover Brunette, DO [facility] Pager [phone]  If 7PM-7AM, please contact night-coverage [email or web address] Password [facility] 11/02/2018, 12:56 PM

## 2018-11-02 NOTE — Progress Notes (Signed)
PHARMACY NOTE:  ANTIMICROBIAL RENAL DOSAGE ADJUSTMENT  Current antimicrobial regimen includes a mismatch between antimicrobial dosage and estimated renal function.  As per policy approved by the Pharmacy & Therapeutics and Medical Executive Committees, the antimicrobial dosage will be adjusted accordingly.  Current antimicrobial dosage:  Levofloxacin 750mg  Daily  Indication: CAP  Renal Function:  Estimated Creatinine Clearance: 44.1 mL/min (A) (by C-G formula based on SCr of 1.15 mg/dL (H)). []      On intermittent HD, scheduled: []      On CRRT    Antimicrobial dosage has been changed to:  Levofloxacin 750mg  Q48h  Additional comments:   Abrina Petz A. , PharmD, BCPS Clinical Pharmacist [place] Pager: [phone] Please utilize Amion for appropriate phone number to reach the unit pharmacist [facility])   11/02/2018 11:01 AM

## 2018-11-02 NOTE — Progress Notes (Signed)
Pt was ambulated by tech ; tech reports patient maintaining 02 saturations above 90% while ambulating ; tech does report one instance where patient's 02 saturations dropped down to 88%  while she was conversating ; will notify MD

## 2018-11-03 LAB — BASIC METABOLIC PANEL
Anion gap: 8 (ref 5–15)
BUN: 28 mg/dL — ABNORMAL HIGH (ref 8–23)
CO2: 49 mmol/L — ABNORMAL HIGH (ref 22–32)
Calcium: 9.4 mg/dL (ref 8.9–10.3)
Chloride: 85 mmol/L — ABNORMAL LOW (ref 98–111)
Creatinine, Ser: 1.19 mg/dL — ABNORMAL HIGH (ref 0.44–1.00)
GFR calc Af Amer: 53 mL/min — ABNORMAL LOW (ref >60)
GFR calc non Af Amer: 46 mL/min — ABNORMAL LOW (ref >60)
Glucose, Bld: 165 mg/dL — ABNORMAL HIGH (ref 70–99)
Potassium: 4.4 mmol/L (ref 3.5–5.1)
Sodium: 142 mmol/L (ref 135–145)

## 2018-11-03 LAB — CBC
HCT: 32.1 % — ABNORMAL LOW (ref 36.0–46.0)
Hemoglobin: 9.4 g/dL — ABNORMAL LOW (ref 12.0–15.0)
MCH: 27.8 pg (ref 26.0–34.0)
MCHC: 29.3 g/dL — ABNORMAL LOW (ref 30.0–36.0)
MCV: 95 fL (ref 80.0–100.0)
Platelets: 310 10*3/uL (ref 150–400)
RBC: 3.38 MIL/uL — ABNORMAL LOW (ref 3.87–5.11)
RDW: 14.9 % (ref 11.5–15.5)
WBC: 9.3 10*3/uL (ref 4.0–10.5)
nRBC: 0 % (ref 0.0–0.2)

## 2018-11-03 LAB — PROCALCITONIN: Procalcitonin: <0.1 ng/mL

## 2018-11-03 MED ORDER — DOCUSATE SODIUM 100 MG PO CAPS
100.0000 mg | ORAL_CAPSULE | Freq: Two times a day (BID) | ORAL | Status: DC
  Administered 2018-11-03 – 2018-11-04 (×2): 100 mg via ORAL
  Filled 2018-11-03 (×3): qty 1

## 2018-11-03 MED ORDER — DOCUSATE SODIUM 100 MG PO CAPS
200.0000 mg | ORAL_CAPSULE | Freq: Once | ORAL | Status: AC
  Administered 2018-11-03: 200 mg via ORAL
  Filled 2018-11-03: qty 2

## 2018-11-03 MED ORDER — IPRATROPIUM-ALBUTEROL 0.5-2.5 (3) MG/3ML IN SOLN
3.0000 mL | Freq: Four times a day (QID) | RESPIRATORY_TRACT | Status: DC
  Administered 2018-11-03 – 2018-11-04 (×3): 3 mL via RESPIRATORY_TRACT
  Filled 2018-11-03 (×3): qty 3

## 2018-11-03 NOTE — Progress Notes (Signed)
PROGRESS NOTE    [name]  YEB:[id number] DOB: 12/08/1945 DOA: 11/01/2018 PCP: [name], [name], MD   Brief Narrative:  73 year old with history of COPD stage III on 2 L nasal cannula at home, GERD, hypertension, CAD status post CABG, morbid obesity with BMI greater than 40 came to the hospital complains of shortness of breath.  Patient was admitted for COPD exacerbation.  She was recently discharged after being treated for community-acquired pneumonia.   Assessment & Plan:   Principal Problem:   COPD with acute exacerbation (HCC) Active Problems:   Hypothyroidism   Essential hypertension   PULMONARY FIBROSIS ILD POST INFLAMMATORY CHRONIC   GERD (gastroesophageal reflux disease)   Barrett's esophagus   Morbid (severe) obesity due to excess calories (HCC)   S/P CABG x 3   CAP (community acquired pneumonia)   COPD (chronic obstructive pulmonary disease) (HCC)  Acute on chronic hypoxic respiratory failure secondary to acute exacerbation of COPD, 2 L nasal cannula at home - Still patient has significant wheezing today.  Continue IV Solu-Medrol 40 mg every 12 hours -Bronchodilators scheduled and as needed.  Increase the frequency to 4 times daily Pulmicort -Incentive spirometry and flutter valve  Multifocal community-acquired pneumonia - Continue Levaquin at this time, pharmacy to dose  Hypothyroidism -Continue Synthroid  Essential hypertension -On Cozaar 50 mg daily.  Lasix 20 mg IV twice daily  Morbid obesity with BMI greater than 40 -Counseled on weight loss, diet and exercise  CAD status post CABG -Currently chest pain-free.  On atorvastatin 40 mg daily.  Barrett's esophagus/GERD -PPI   DVT prophylaxis: Lovenox Code Status: Full code Family Communication: Daughter at bedside Disposition Plan: Maintain hospital stay for at least another 24 hours as patient still requires IV Solu-Medrol.  She still significantly short of breath at  rest  Consultants:   None  Procedures:   None  Antimicrobials:   Levaquin   Subjective: Patient still has exertional shortness of breath even with walking to the bathroom.  Review of Systems Otherwise negative except as per HPI, including: General: Denies fever, chills, night sweats or unintended weight loss. Resp: Denies hemoptysis Cardiac: Denies chest pain, palpitations, orthopnea, paroxysmal nocturnal dyspnea. GI: Denies abdominal pain, nausea, vomiting, diarrhea or constipation GU: Denies dysuria, frequency, hesitancy or incontinence MS: Denies muscle aches, joint pain or swelling Neuro: Denies headache, neurologic deficits (focal weakness, numbness, tingling), abnormal gait Psych: Denies anxiety, depression, SI/HI/AVH Skin: Denies new rashes or lesions ID: Denies sick contacts, exotic exposures, travel  Objective: Vitals:   11/02/18 1721 11/02/18 2124 11/02/18 2125 11/03/18 0040  BP: (!) 132/99   (!) 151/64  Pulse: 90     Resp: 16   16  Temp: 99.1 F (37.3 C)   98.7 F (37.1 C)  TempSrc: Oral   Oral  SpO2: 94% 97% 95% 97%  Weight:      Height:        Intake/Output Summary (Last 24 hours) at 11/03/2018 1201 Last data filed at 11/02/2018 2300 Gross per 24 hour  Intake 120 ml  Output -  Net 120 ml   Filed Weights   11/01/18 1309  Weight: 90.7 kg    Examination:  General exam: Appears calm and comfortable, 2 L nasal cannula Respiratory system: Diffuse diminished breath sounds Cardiovascular system: S1 & S2 heard, RRR. No JVD, murmurs, rubs, gallops or clicks. No pedal edema. Gastrointestinal system: Abdomen is nondistended, soft and nontender. No organomegaly or masses felt. Normal bowel sounds heard. Central nervous system:  Alert and oriented. No focal neurological deficits. Extremities: Symmetric 5 x 5 power. Skin: No rashes, lesions or ulcers Psychiatry: Judgement and insight appear normal. Mood & affect appropriate.     Data Reviewed:    CBC: Recent Labs  Lab 10/28/18 1400 10/29/18 0257 10/30/18 0903 11/01/18 1313 11/02/18 0658 11/03/18 0336  WBC 15.3* 14.6* 10.4 9.7 8.0 9.3  NEUTROABS 14.2*  --   --   --   --   --   HGB 9.3* 8.6* 8.8* 9.0* 9.0* 9.4*  HCT 32.9* 29.5* 30.9* 32.1* 30.5* 32.1*  MCV 98.8 98.3 98.1 101.3* 96.5 95.0  PLT 189 190 206 246 281 310   Basic Metabolic Panel: Recent Labs  Lab 10/29/18 0257 10/30/18 0903 11/01/18 1313 11/02/18 0658 11/03/18 0336  NA 136 139 142 143 142  K 3.6 4.0 4.3 4.1 4.4  CL 90* 95* 91* 85* 85*  CO2 36* 37* 41* 49* 49*  GLUCOSE 134* 130* 114* 137* 165*  BUN 38* 32* 21 23 28*  CREATININE 1.39* 1.12* 0.98 1.15* 1.19*  CALCIUM 8.4* 8.9 9.5 9.4 9.4   GFR: Estimated Creatinine Clearance: 42.6 mL/min (A) (by C-G formula based on SCr of 1.19 mg/dL (H)). Liver Function Tests: Recent Labs  Lab 10/28/18 1400  AST 20  ALT 17  ALKPHOS 44  BILITOT 0.7  PROT 6.6  ALBUMIN 3.0*   No results for input(s): LIPASE, AMYLASE in the last 168 hours. No results for input(s): AMMONIA in the last 168 hours. Coagulation Profile: No results for input(s): INR, PROTIME in the last 168 hours. Cardiac Enzymes: No results for input(s): CKTOTAL, CKMB, CKMBINDEX, TROPONINI in the last 168 hours. BNP (last 3 results) No results for input(s): PROBNP in the last 8760 hours. HbA1C: No results for input(s): HGBA1C in the last 72 hours. CBG: Recent Labs  Lab 11/02/18 0624  GLUCAP 118*   Lipid Profile: No results for input(s): CHOL, HDL, LDLCALC, TRIG, CHOLHDL, LDLDIRECT in the last 72 hours. Thyroid Function Tests: No results for input(s): TSH, T4TOTAL, FREET4, T3FREE, THYROIDAB in the last 72 hours. Anemia Panel: No results for input(s): VITAMINB12, FOLATE, FERRITIN, TIBC, IRON, RETICCTPCT in the last 72 hours. Sepsis Labs: Recent Labs  Lab 11/03/18 0336  PROCALCITON <0.10    Recent Results (from the past 240 hour(s))  Culture, blood (routine x 2)     Status: None    Collection Time: 10/28/18  5:08 PM  Result Value Ref Range Status   Specimen Description BLOOD RIGHT ANTECUBITAL  Final   Special Requests   Final    BOTTLES DRAWN AEROBIC AND ANAEROBIC Blood Culture adequate volume   Culture   Final    NO GROWTH 5 DAYS Performed at [facility], [address] [place], [place] [address]    Report Status 11/02/2018 FINAL  Final  Culture, blood (routine x 2)     Status: None   Collection Time: 10/28/18  5:08 PM  Result Value Ref Range Status   Specimen Description BLOOD RIGHT FOREARM  Final   Special Requests   Final    BOTTLES DRAWN AEROBIC AND ANAEROBIC Blood Culture adequate volume   Culture   Final    NO GROWTH 5 DAYS Performed at [facility], [address] [place], [place] [address]    Report Status 11/02/2018 FINAL  Final         Radiology Studies: Dg Chest Portable 1 View  Result Date: 11/01/2018 CLINICAL DATA:  Shortness of breath.  Recent  episode of pneumonia. EXAM: PORTABLE CHEST 1 VIEW COMPARISON:  PA and lateral chest x-ray of October 30, 2018 as well as chest CT scan of October 28, 2018 and PA and lateral chest x-ray of August 31, 2018. FINDINGS: The lungs are mildly hypoinflated. The interstitial markings remain increased bilaterally greatest at the left lung base. There may be a small left pleural effusion. The heart is top-normal in size. The pulmonary vascularity is indistinct. The patient has undergone previous CABG. There is calcification in the mitral valvular annulus. The observed bony thorax is unremarkable. IMPRESSION: Persistent increased interstitial densities bilaterally greatest at the left lung base. This may reflect pneumonia or mild pulmonary edema or a combination of the same. The appearance of both lungs has not greatly changed since yesterday's study but the appearance of the lungs has deteriorated considerably since an August 31, 2018 study. Electronically Signed   By: David  [place] M.D.   On:  11/01/2018 13:40        Scheduled Meds: . atorvastatin  40 mg Oral q1800  . budesonide (PULMICORT) nebulizer solution  0.25 mg Nebulization BID  . citalopram  40 mg Oral q morning - 10a  . enoxaparin (LOVENOX) injection  40 mg Subcutaneous Q24H  . furosemide  20 mg Oral BID  . guaiFENesin  600 mg Oral BID  . ipratropium-albuterol  3 mL Nebulization TID  . [START ON 11/04/2018] levofloxacin  750 mg Oral Q48H  . levothyroxine  150 mcg Oral QAC breakfast  . losartan  50 mg Oral Daily  . methylPREDNISolone (SOLU-MEDROL) injection  40 mg Intravenous Q12H  . montelukast  10 mg Oral QHS  . pantoprazole  40 mg Oral QAC breakfast  . sodium chloride flush  3 mL Intravenous Q12H  . traMADol  50 mg Oral BID   Continuous Infusions: . sodium chloride       LOS: 1 day   Time spent= 35 mins    Golden Emile Joline Maxcy, MD [facility] Pager [phone]   If 7PM-7AM, please contact night-coverage [email or web address] Password [email or web address] 11/03/2018, 12:01 PM

## 2018-11-04 ENCOUNTER — Telehealth: Payer: Self-pay | Admitting: Internal Medicine

## 2018-11-04 LAB — CBC
HCT: 32.1 % — ABNORMAL LOW (ref 36.0–46.0)
Hemoglobin: 9.6 g/dL — ABNORMAL LOW (ref 12.0–15.0)
MCH: 28.7 pg (ref 26.0–34.0)
MCHC: 29.9 g/dL — ABNORMAL LOW (ref 30.0–36.0)
MCV: 95.8 fL (ref 80.0–100.0)
Platelets: 323 10*3/uL (ref 150–400)
RBC: 3.35 MIL/uL — ABNORMAL LOW (ref 3.87–5.11)
RDW: 15 % (ref 11.5–15.5)
WBC: 10.5 10*3/uL (ref 4.0–10.5)
nRBC: 0 % (ref 0.0–0.2)

## 2018-11-04 LAB — BASIC METABOLIC PANEL
Anion gap: 11 (ref 5–15)
BUN: 30 mg/dL — ABNORMAL HIGH (ref 8–23)
CO2: 46 mmol/L — ABNORMAL HIGH (ref 22–32)
Calcium: 9.2 mg/dL (ref 8.9–10.3)
Chloride: 86 mmol/L — ABNORMAL LOW (ref 98–111)
Creatinine, Ser: 1.25 mg/dL — ABNORMAL HIGH (ref 0.44–1.00)
GFR calc Af Amer: 50 mL/min — ABNORMAL LOW (ref >60)
GFR calc non Af Amer: 43 mL/min — ABNORMAL LOW (ref >60)
Glucose, Bld: 154 mg/dL — ABNORMAL HIGH (ref 70–99)
Potassium: 4.4 mmol/L (ref 3.5–5.1)
Sodium: 143 mmol/L (ref 135–145)

## 2018-11-04 LAB — MAGNESIUM: Magnesium: 1.9 mg/dL (ref 1.7–2.4)

## 2018-11-04 LAB — BRAIN NATRIURETIC PEPTIDE: B Natriuretic Peptide: 264.9 pg/mL — ABNORMAL HIGH (ref 0.0–100.0)

## 2018-11-04 MED ORDER — BUDESONIDE 0.25 MG/2ML IN SUSP: 0.2500 mg | Freq: Two times a day (BID) | RESPIRATORY_TRACT | 0 refills | Status: DC

## 2018-11-04 MED ORDER — IPRATROPIUM BROMIDE 0.02 % IN SOLN: 0.5000 mg | RESPIRATORY_TRACT | 0 refills | Status: DC | PRN

## 2018-11-04 MED ORDER — PREDNISONE 20 MG PO TABS: ORAL_TABLET | ORAL | 0 refills | Status: DC

## 2018-11-04 MED ORDER — ALBUTEROL SULFATE (2.5 MG/3ML) 0.083% IN NEBU: 2.5000 mg | INHALATION_SOLUTION | RESPIRATORY_TRACT | 0 refills | Status: DC | PRN

## 2018-11-04 NOTE — Telephone Encounter (Signed)
Patient has a hospital follow up (TCM) scheduled for 11/09/18 at 3:30.

## 2018-11-04 NOTE — Telephone Encounter (Signed)
FYI

## 2018-11-04 NOTE — Telephone Encounter (Unsigned)
Copied from CRM [id number]. Topic: Quick Communication - Home Health Verbal Orders >> Nov 04, 2018 11:49 AM Crist Infante wrote: Caller/Agency: [facility] Callback Number: [phone] [facility] will be doing start of care on 11/09/18

## 2018-11-04 NOTE — Discharge Summary (Signed)
Physician Discharge Summary  [name] KKX:[id number] DOB: 01/09/1946 DOA: 11/01/2018  PCP: [name], [name], MD  Admit date: 11/01/2018 Discharge date: 11/04/2018  Admitted From:Home Disposition: Home  Recommendations for Outpatient Follow-up:  1. Follow up with PCP in 1-2 weeks 2. Please obtain BMP/CBC in one week your next doctors visit.  3. Bronchodilators have been prescribed.  Nebulizer machine along with nebs 4. Prolonged taper course of steroids over 10 days 5. Would recommend outpatient follow-up with pulmonary if necessary  Home Health: None Equipment/Devices: None Discharge Condition: Stable CODE STATUS: Full code Diet recommendation: Heart healthy diet  Brief/Interim Summary: 73 year old with history of COPD stage III on 2 L nasal cannula at home, GERD, hypertension, CAD status post CABG, morbid obesity with BMI greater than 40 came to the hospital complains of shortness of breath.  Patient was admitted for COPD exacerbation.  She was recently discharged after being treated for community-acquired pneumonia. During the hospitalization patient received routine treatment for COPD along with bronchodilators and steroids.  She was also given incentive spirometry and flutter valve.  Initially Levaquin was continued but her procalcitonin levels were negative therefore Levaquin was stopped.  Today patient is stable on her 2 L nasal cannula, symptomatically feeling better and wants to go home.  She tells me she is about 95% better. We will discharge her today in stable condition.  Nebulizer has been given along with bronchodilators.   Discharge Diagnoses:  Principal Problem:   COPD with acute exacerbation (HCC) Active Problems:   Hypothyroidism   Essential hypertension   PULMONARY FIBROSIS ILD POST INFLAMMATORY CHRONIC   GERD (gastroesophageal reflux disease)   Barrett's esophagus   Morbid (severe) obesity due to excess calories (HCC)   S/P CABG x 3   CAP  (community acquired pneumonia)   COPD (chronic obstructive pulmonary disease) (HCC)  Acute on chronic hypoxic respiratory failure secondary to acute exacerbation of COPD, 2 L nasal cannula at home -Patient is doing much better today.  We will transition IV Solu-Medrol to p.o. prednisone to be tapered over next 10 days.  Bronchodilators have been given along with Pulmicort.  She is advised to follow-up outpatient with primary care provider in about 7-10 days. -Procalcitonin levels are negative, stop Levaquin at this time  Hypothyroidism -Continue Synthroid  Essential hypertension -On Cozaar 50 mg daily.    Continue home Lasix  Morbid obesity with BMI greater than 40 -Counseled on weight loss, diet and exercise  CAD status post CABG -Currently chest pain-free.  On atorvastatin 40 mg daily.  Barrett's esophagus/GERD -PPI  Patient on Lovenox during hospitalization for DVT prophylaxis Full code Daughter at bedside, discharge patient today   Discharge Instructions   Allergies as of 11/04/2018   No Known Allergies     Medication List    STOP taking these medications   levofloxacin 750 MG tablet Commonly known as:  LEVAQUIN     TAKE these medications   albuterol 108 (90 Base) MCG/ACT inhaler Commonly known as:  PROAIR HFA 2 puffs every 4 hours as needed only  if your can't catch your breath What changed:    how much to take  how to take this  when to take this  reasons to take this  additional instructions   albuterol (2.5 MG/3ML) 0.083% nebulizer solution Commonly known as:  PROVENTIL Take 3 mLs (2.5 mg total) by nebulization every 4 (four) hours as needed for shortness of breath. What changed:  You were already taking a medication with  the same name, and this prescription was added. Make sure you understand how and when to take each.   atorvastatin 40 MG tablet Commonly known as:  LIPITOR Take 1 tablet (40 mg total) by mouth daily at 6 PM.   budesonide  0.25 MG/2ML nebulizer solution Commonly known as:  PULMICORT Take 2 mLs (0.25 mg total) by nebulization 2 (two) times daily.   citalopram 40 MG tablet Commonly known as:  CELEXA TAKE 1 TABLET BY MOUTH  EVERY MORNING What changed:  when to take this   furosemide 20 MG tablet Commonly known as:  LASIX TAKE 1 TABLET BY MOUTH TWO  TIMES DAILY What changed:  when to take this   ipratropium 0.02 % nebulizer solution Commonly known as:  ATROVENT Take 2.5 mLs (0.5 mg total) by nebulization every 4 (four) hours as needed for wheezing or shortness of breath.   levothyroxine 150 MCG tablet Commonly known as:  SYNTHROID, LEVOTHROID TAKE 1 TABLET BY MOUTH  DAILY BEFORE BREAKFAST What changed:    how much to take  how to take this  when to take this  additional instructions   losartan 50 MG tablet Commonly known as:  COZAAR Take 1 tablet (50 mg total) by mouth daily.   montelukast 10 MG tablet Commonly known as:  SINGULAIR TAKE 1 TABLET BY MOUTH AT  BEDTIME   OXYGEN Inhale 2 L into the lungs continuous. continuous o2   pantoprazole 40 MG tablet Commonly known as:  PROTONIX Take 1 tablet (40 mg total) by mouth daily before breakfast.   predniSONE 20 MG tablet Commonly known as:  DELTASONE Take 2 tablets (40 mg total) by mouth 2 (two) times daily with a meal for 2 days, THEN 2 tablets (40 mg total) daily with breakfast for 2 days, THEN 1.5 tablets (30 mg total) daily with breakfast for 2 days, THEN 1 tablet (20 mg total) daily with breakfast for 2 days, THEN 0.5 tablets (10 mg total) daily with breakfast for 2 days. Start taking on:  November 04, 2018   [facility] Soln [facility] Generic drug:  golimumab Inject 50 mg into the vein every 8 (eight) weeks.   SYMBICORT 160-4.5 MCG/ACT inhaler Generic drug:  budesonide-formoterol USE 2 PUFFS TWO TIMES DAILY What changed:  See the new instructions.   traMADol 50 MG tablet Commonly known as:  ULTRAM TAKE 1-2 TABLETS  EVERY 6 HOURS AS NEEDED FOR PAIN What changed:    how much to take  how to take this  when to take this  additional instructions   triamcinolone cream 0.1 % Commonly known as:  KENALOG Apply 1 application topically 2 (two) times daily as needed (for psoriasis).   TYLENOL ARTHRITIS PAIN 650 MG CR tablet Generic drug:  acetaminophen Take 1,300 mg by mouth 2 (two) times daily.   Vitamin D3 50 MCG (2000 UT) Tabs Take 2,000 Int'l Units by mouth daily.            Durable Medical Equipment  (From admission, onward)         Start     Ordered   11/04/18 0826  For home use only DME Nebulizer/meds  Once    Question:  Patient needs a nebulizer to treat with the following condition  Answer:  COPD (chronic obstructive pulmonary disease) (HCC)   11/04/18 0825         Follow-up Information    Philip AspenHernandez Acosta, Limmie PatriciaEstela Y, MD. Schedule an appointment as soon as possible for  a visit in 1 week(s).   Specialty:  Internal Medicine Contact information: [address] [place] [address] [phone]        Lars Masson, MD .   Specialty:  Cardiology Contact information: [address] [place] [address] [phone]          No Known Allergies  You were cared for by a hospitalist during your hospital stay. If you have any questions about your discharge medications or the care you received while you were in the hospital after you are discharged, you can call the unit and asked to speak with the hospitalist on call if the hospitalist that took care of you is not available. Once you are discharged, your primary care physician will handle any further medical issues. Please note that no refills for any discharge medications will be authorized once you are discharged, as it is imperative that you return to your primary care physician (or establish a relationship with a primary care physician if you do not have one) for your aftercare needs so that  they can reassess your need for medications and monitor your lab values.  Consultations:  None   Procedures/Studies: Dg Chest 2 View  Result Date: 10/30/2018 CLINICAL DATA:  73 year old with shortness of breath on exertion. EXAM: CHEST - 2 VIEW COMPARISON:  10/28/2018 and chest CT 10/28/2018 FINDINGS: Again noted are patchy densities in both lungs particularly along the periphery of the right lung. The disease in left lower lobe has slightly progressed. In addition, there is peribronchial thickening with interstitial prominence and cannot exclude pulmonary edema. Cardiac silhouette is grossly stable with median sternotomy wires. Difficult to exclude small pleural effusions. Negative for pneumothorax. IMPRESSION: Persistent patchy bilateral lung disease and concern for mild progression in the left lower lobe. Findings are suggestive for multifocal pneumonia. Prominent interstitial lung markings may represent underlying interstitial edema. Difficult to exclude small pleural effusions. Electronically Signed   By: Richarda Overlie M.D.   On: 10/30/2018 10:24   Dg Chest 2 View  Result Date: 10/28/2018 CLINICAL DATA:  Short of breath, dizziness, bilateral chest pain and cough over the last 2 days EXAM: CHEST - 2 VIEW COMPARISON:  Chest x-ray of 08/31/2017 FINDINGS: The lungs are poorly aerated and there is basilar atelectasis present. There is cardiomegaly present with small effusions and possible pulmonary vascular congestion, which may indicate mild congestive heart failure. Median sternotomy sutures are noted. No bony abnormality is seen. IMPRESSION: Poor inspiration with cardiomegaly, probable small effusions, and minimal congestion may indicate mild CHF. No focal pneumonia. Electronically Signed   By: Dwyane Dee M.D.   On: 10/28/2018 14:50   Ct Angio Chest Pe W And/or Wo Contrast  Result Date: 10/28/2018 CLINICAL DATA:  Chest pain, shortness of breath for 3 days, cough EXAM: CT ANGIOGRAPHY CHEST WITH  CONTRAST TECHNIQUE: Multidetector CT imaging of the chest was performed using the standard protocol during bolus administration of intravenous contrast. Multiplanar CT image reconstructions and MIPs were obtained to evaluate the vascular anatomy. CONTRAST:  63mL ISOVUE-370 IOPAMIDOL (ISOVUE-370) INJECTION 76% COMPARISON:  Chest x-ray of 10/28/2018 and 08/31/2018, and CT chest of 07/23/2015 FINDINGS: Cardiovascular: The pulmonary arteries are moderately well opacified. There is no evidence of acute pulmonary embolism. The thoracic aorta is faintly opacified and moderate thoracic aortic atherosclerosis is present. The heart is mildly enlarged and there are diffuse coronary artery calcifications present. Calcification of of the mitral annulus also is noted. Mediastinum/Nodes: There is significant motion and  artifact present making evaluation of the mediastinum difficult. There do appear to be somewhat prominent mediastinal nodes present 1 of the larger nodes is precarinal on image 129 series 7 measuring 15 mm. Also there appear to be right subcarinal nodes present which are difficult to measure. A right paratracheal node on image 95 measures 11 mm. These most likely are reactive nodes. Lungs/Pleura: There is apparent scarring noted anteriorly in both upper lobes which could be related to radiation treatment in the proper clinical setting. However, there are patchy diffuse lung infiltrates greatest in the left lower lobe, anterior inferior left upper lobe, lingula, and right middle lobe most consistent with multifocal pneumonia. Only a tiny amount of fluid is present bilaterally. No suspicious lung nodule is seen. Follow-up after interval treatment is recommended to exclude underlying neoplasm. Upper Abdomen: Multiple streak artifacts overlie the upper abdomen, but no significant abnormality is noted. There does appear to be a small hiatal hernia present. Musculoskeletal: The thoracic vertebrae are in normal alignment  with degenerative changes diffusely. There may be mild compression deformity of the anterior superior aspect of T5 vertebral body of uncertain age. Correlate clinically. Review of the MIP images confirms the above findings. IMPRESSION: 1. No evidence of acute pulmonary embolism. 2. However there are patchy lung opacities bilaterally as described above most consistent with multifocal pneumonia. Recommend follow-up after interval treatment. 3. Cardiomegaly and diffuse coronary artery calcifications. 4. Partial compression of anterior superior aspect of T5 of uncertain age. Correlate clinically. Electronically Signed   By: Dwyane Dee M.D.   On: 10/28/2018 16:10   Dg Chest Portable 1 View  Result Date: 11/01/2018 CLINICAL DATA:  Shortness of breath.  Recent episode of pneumonia. EXAM: PORTABLE CHEST 1 VIEW COMPARISON:  PA and lateral chest x-ray of October 30, 2018 as well as chest CT scan of October 28, 2018 and PA and lateral chest x-ray of August 31, 2018. FINDINGS: The lungs are mildly hypoinflated. The interstitial markings remain increased bilaterally greatest at the left lung base. There may be a small left pleural effusion. The heart is top-normal in size. The pulmonary vascularity is indistinct. The patient has undergone previous CABG. There is calcification in the mitral valvular annulus. The observed bony thorax is unremarkable. IMPRESSION: Persistent increased interstitial densities bilaterally greatest at the left lung base. This may reflect pneumonia or mild pulmonary edema or a combination of the same. The appearance of both lungs has not greatly changed since yesterday's study but the appearance of the lungs has deteriorated considerably since an August 31, 2018 study. Electronically Signed   By: David  [place] M.D.   On: 11/01/2018 13:40      Subjective: Patient tells me she feels 95% better and wants to go home. Patient did well with ambulatory pulse ox, only desaturated down to 89% on 2  L.  She does use 2 L chronically at home.  General = no fevers, chills, dizziness, malaise, fatigue HEENT/EYES = negative for pain, redness, loss of vision, double vision, blurred vision, loss of hearing, sore throat, hoarseness, dysphagia Cardiovascular= negative for chest pain, palpitation, murmurs, lower extremity swelling Respiratory/lungs= negative for shortness of breath, cough, hemoptysis, wheezing, mucus production Gastrointestinal= negative for nausea, vomiting,, abdominal pain, melena, hematemesis Genitourinary= negative for Dysuria, Hematuria, Change in Urinary Frequency MSK = Negative for arthralgia, myalgias, Back Pain, Joint swelling  Neurology= Negative for headache, seizures, numbness, tingling  Psychiatry= Negative for anxiety, depression, suicidal and homocidal ideation Allergy/Immunology= Medication/Food allergy as listed  Skin= Negative for  Rash, lesions, ulcers, itching    Discharge Exam: Vitals:   11/04/18 0746 11/04/18 0902  BP: (!) 151/70   Pulse: 72   Resp:    Temp: 98.3 F (36.8 C)   SpO2: 97% 93%   Vitals:   11/03/18 2044 11/03/18 2342 11/04/18 0746 11/04/18 0902  BP:  (!) 137/59 (!) 151/70   Pulse: 78 71 72   Resp: 16 15    Temp:  98.2 F (36.8 C) 98.3 F (36.8 C)   TempSrc:  Oral Oral   SpO2: 95% 97% 97% 93%  Weight:      Height:        General: Pt is alert, awake, not in acute distress, 2 L nasal cannula Cardiovascular: RRR, S1/S2 +, no rubs, no gallops Respiratory: Mild coarse breath sounds Abdominal: Soft, NT, ND, bowel sounds + Extremities: no edema, no cyanosis    The results of significant diagnostics from this hospitalization (including imaging, microbiology, ancillary and laboratory) are listed below for reference.     Microbiology: Recent Results (from the past 240 hour(s))  Culture, blood (routine x 2)     Status: None   Collection Time: 10/28/18  5:08 PM  Result Value Ref Range Status   Specimen Description BLOOD RIGHT  ANTECUBITAL  Final   Special Requests   Final    BOTTLES DRAWN AEROBIC AND ANAEROBIC Blood Culture adequate volume   Culture   Final    NO GROWTH 5 DAYS Performed at [facility], [address] [place], [place] [address]    Report Status 11/02/2018 FINAL  Final  Culture, blood (routine x 2)     Status: None   Collection Time: 10/28/18  5:08 PM  Result Value Ref Range Status   Specimen Description BLOOD RIGHT FOREARM  Final   Special Requests   Final    BOTTLES DRAWN AEROBIC AND ANAEROBIC Blood Culture adequate volume   Culture   Final    NO GROWTH 5 DAYS Performed at [facility], [address] [place], [place] [address]    Report Status 11/02/2018 FINAL  Final     Labs: BNP (last 3 results) Recent Labs    10/28/18 1400 11/01/18 1313 11/04/18 0232  BNP 226.5* 563.3* 264.9*   Basic Metabolic Panel: Recent Labs  Lab 10/30/18 0903 11/01/18 1313 11/02/18 0658 11/03/18 0336 11/04/18 0232  NA 139 142 143 142 143  K 4.0 4.3 4.1 4.4 4.4  CL 95* 91* 85* 85* 86*  CO2 37* 41* 49* 49* 46*  GLUCOSE 130* 114* 137* 165* 154*  BUN 32* 21 23 28* 30*  CREATININE 1.12* 0.98 1.15* 1.19* 1.25*  CALCIUM 8.9 9.5 9.4 9.4 9.2  MG  --   --   --   --  1.9   Liver Function Tests: Recent Labs  Lab 10/28/18 1400  AST 20  ALT 17  ALKPHOS 44  BILITOT 0.7  PROT 6.6  ALBUMIN 3.0*   No results for input(s): LIPASE, AMYLASE in the last 168 hours. No results for input(s): AMMONIA in the last 168 hours. CBC: Recent Labs  Lab 10/28/18 1400  10/30/18 0903 11/01/18 1313 11/02/18 0658 11/03/18 0336 11/04/18 0232  WBC 15.3*   < > 10.4 9.7 8.0 9.3 10.5  NEUTROABS 14.2*  --   --   --   --   --   --   HGB 9.3*   < > 8.8* 9.0* 9.0* 9.4* 9.6*  HCT 32.9*   < >  30.9* 32.1* 30.5* 32.1* 32.1*  MCV 98.8   < > 98.1 101.3* 96.5 95.0 95.8  PLT 189   < > 206 246 281 310 323   < > = values in this interval not displayed.   Cardiac Enzymes: No results for input(s): CKTOTAL, CKMB,  CKMBINDEX, TROPONINI in the last 168 hours. BNP: Invalid input(s): POCBNP CBG: Recent Labs  Lab 11/02/18 0624  GLUCAP 118*   D-Dimer No results for input(s): DDIMER in the last 72 hours. Hgb A1c No results for input(s): HGBA1C in the last 72 hours. Lipid Profile No results for input(s): CHOL, HDL, LDLCALC, TRIG, CHOLHDL, LDLDIRECT in the last 72 hours. Thyroid function studies No results for input(s): TSH, T4TOTAL, T3FREE, THYROIDAB in the last 72 hours.  Invalid input(s): FREET3 Anemia work up No results for input(s): VITAMINB12, FOLATE, FERRITIN, TIBC, IRON, RETICCTPCT in the last 72 hours. Urinalysis    Component Value Date/Time   COLORURINE YELLOW 05/31/2013 1412   APPEARANCEUR CLEAR 05/31/2013 1412   LABSPEC 1.027 05/31/2013 1412   PHURINE 5.0 05/31/2013 1412   GLUCOSEU NEGATIVE 05/31/2013 1412   HGBUR NEGATIVE 05/31/2013 1412   HGBUR negative 07/18/2010 0941   BILIRUBINUR SMALL (A) 05/31/2013 1412   BILIRUBINUR n 08/10/2012 1131   KETONESUR NEGATIVE 05/31/2013 1412   PROTEINUR 30 (A) 05/31/2013 1412   UROBILINOGEN 0.2 05/31/2013 1412   NITRITE NEGATIVE 05/31/2013 1412   LEUKOCYTESUR NEGATIVE 05/31/2013 1412   Sepsis Labs Invalid input(s): PROCALCITONIN,  WBC,  LACTICIDVEN Microbiology Recent Results (from the past 240 hour(s))  Culture, blood (routine x 2)     Status: None   Collection Time: 10/28/18  5:08 PM  Result Value Ref Range Status   Specimen Description BLOOD RIGHT ANTECUBITAL  Final   Special Requests   Final    BOTTLES DRAWN AEROBIC AND ANAEROBIC Blood Culture adequate volume   Culture   Final    NO GROWTH 5 DAYS Performed at [facility], [address], [place], [place] [address]    Report Status 11/02/2018 FINAL  Final  Culture, blood (routine x 2)     Status: None   Collection Time: 10/28/18  5:08 PM  Result Value Ref Range Status   Specimen Description BLOOD RIGHT FOREARM  Final   Special Requests   Final    BOTTLES DRAWN AEROBIC  AND ANAEROBIC Blood Culture adequate volume   Culture   Final    NO GROWTH 5 DAYS Performed at [facility], [address] [place], [place] [address]    Report Status 11/02/2018 FINAL  Final     Time coordinating discharge:  I have spent 35 minutes face to face with the patient and on the ward discussing the patients care, assessment, plan and disposition with other care givers. >50% of the time was devoted counseling the patient about the risks and benefits of treatment/Discharge disposition and coordinating care.   SIGNED:   Dimple Nanas, MD  [facility] 11/04/2018, 10:04 AM Pager   If 7PM-7AM, please contact night-coverage [email or web address] Password [email or web address]

## 2018-11-04 NOTE — Care Management Note (Addendum)
Case Management Note  Patient Details  Name: [name] MRN: [id number] Date of Birth: May 23, 1946  Subjective/Objective: 73 yo female presented with SOB; recently discharge home on 10/30/18.               Action/Plan: CM met with patient to discuss transitional needs. Patient lives at home alone, independent with ADLs, has home O2 serviced by [place]; RW to assist with ambulation. PCP verified as: Dr. Deniece Ree; pharmacy of choice: [facility]. Patient [facility] on 12.28/19 with [facility] arranged with [facility]; preference given with patient requesting to continue. Order placed for a home nebulizer with preference list provided; [facility] selected and will be provided prior to patient transitioning home; AVS updated. [place] resumption discussed with Tiffany RN, [facility] liaison. Patients daughter will provide transportation home.    Expected Discharge Date:  11/04/18               Expected Discharge Plan:  [place]  In-House Referral:  [facility]  Discharge planning Services  CM Consult  Post Acute Care Choice:  Durable Medical Equipment, Resumption of Svcs/PTA Provider Choice offered to:  Patient  DME Arranged:  Nebulizer/meds DME Agency:  [place] Arranged:  RN, Disease Management, PT(previously open to [facility]) [place]:  [facility] (now [facility])  Status of Service:  Completed, signed off  If discussed at [place] of Stay Meetings, dates discussed:    Additional Comments:  Midge Minium RN, BSN, NCM-BC, ACM-RN [phone] 11/04/2018, 10:45 AM

## 2018-11-04 NOTE — Consult Note (Signed)
   [facility] Inpatient Consult   11/04/2018  [name] 1945/12/03 [id number]   Patient chart has been reviewed for readmissions less than 30 days and for unplanned readmission score of 17%, medium.  Patient assessed for community [facility] Care Management follow up needs. Spoke with patient and daughter at bedside explaining [facility] Care Management [facility]. Explained that our community based plan of care is focused on disease management and community resource support.  Daughter agrees for patient to receive EMMI COPD calls upon transition home.   Christophe Louis, MSN, RN [facility] Liaison Nurse Mobile Phone [phone]  Toll free office [phone]

## 2018-11-04 NOTE — Telephone Encounter (Signed)
Does she have a HFU appt?

## 2018-11-04 NOTE — Progress Notes (Signed)
Patient Saturations on 2L at Rest 96 %  Patient Saturations on 2L while Ambulating = 89%  Patient Saturations on  3L of oxygen while Ambulating = 92%  Please briefly explain why patient needs home oxygen: PTA patient is on 2L at rest and 3L with ambulation

## 2018-11-05 ENCOUNTER — Ambulatory Visit
Admission: RE | Admit: 2018-11-05 | Discharge: 2018-11-05 | Disposition: A | Discharge status: Home / Self Care | Payer: Medicare Other | Source: Ambulatory Visit | Attending: Adult Health | Admitting: Adult Health

## 2018-11-05 DIAGNOSIS — I610 Nontraumatic intracerebral hemorrhage in hemisphere, subcortical: Secondary | ICD-10-CM | POA: Diagnosis not present

## 2018-11-08 ENCOUNTER — Other Ambulatory Visit: Payer: Self-pay | Admitting: Adult Health

## 2018-11-08 ENCOUNTER — Telehealth: Payer: Self-pay

## 2018-11-08 MED ORDER — ASPIRIN 81 MG PO TABS: 81.0000 mg | ORAL_TABLET | Freq: Every day | ORAL | 0 refills | Status: DC

## 2018-11-08 NOTE — Telephone Encounter (Signed)
-----   Message from George Hugh, NP sent at 11/08/2018  6:47 AM EST ----- Please advise patient that her recent imaging showed resolution of prior hemorrhage.  At this time, recommend restarting aspirin 81 mg for secondary stroke prevention.

## 2018-11-08 NOTE — Telephone Encounter (Signed)
Rn call patient that the CT scan showed resolution of prior hemorrhage. Its recommend she start retaking aspirin 81mg  for stroke prevention. Rn stated she can start today. Rn stated its preferred to take aspirin in the am. Pt also verbalized understanding to restart aspirin 81mg  today. Pt verbalized understanding. ------

## 2018-11-09 ENCOUNTER — Encounter: Payer: Self-pay | Admitting: Internal Medicine

## 2018-11-09 ENCOUNTER — Ambulatory Visit (INDEPENDENT_AMBULATORY_CARE_PROVIDER_SITE_OTHER): Payer: Medicare Other | Admitting: Internal Medicine

## 2018-11-09 ENCOUNTER — Telehealth: Payer: Self-pay | Admitting: Internal Medicine

## 2018-11-09 ENCOUNTER — Ambulatory Visit: Payer: Medicare Other | Admitting: Internal Medicine

## 2018-11-09 VITALS — BP 110/80 | HR 79 | Temp 100.3°F | Wt 209.3 lb

## 2018-11-09 DIAGNOSIS — L4050 Arthropathic psoriasis, unspecified: Secondary | ICD-10-CM

## 2018-11-09 DIAGNOSIS — J9612 Chronic respiratory failure with hypercapnia: Secondary | ICD-10-CM | POA: Diagnosis not present

## 2018-11-09 DIAGNOSIS — J449 Chronic obstructive pulmonary disease, unspecified: Secondary | ICD-10-CM

## 2018-11-09 DIAGNOSIS — J9611 Chronic respiratory failure with hypoxia: Secondary | ICD-10-CM

## 2018-11-09 DIAGNOSIS — F332 Major depressive disorder, recurrent severe without psychotic features: Secondary | ICD-10-CM | POA: Insufficient documentation

## 2018-11-09 DIAGNOSIS — I5033 Acute on chronic diastolic (congestive) heart failure: Secondary | ICD-10-CM

## 2018-11-09 MED ORDER — TIOTROPIUM BROMIDE MONOHYDRATE 18 MCG IN CAPS: 18.0000 ug | ORAL_CAPSULE | Freq: Every day | RESPIRATORY_TRACT | 12 refills | Status: DC

## 2018-11-09 NOTE — Patient Instructions (Addendum)
-  It was nice seeing you today!  -Glad you are feeling better after your hospitalization.  -Continue taking symbicort twice a day and add spiriva once a day. Albuterol as needed for shortness of breath in nebulizer form or inhaler.  -Please schedule followup in 3-4 months for your annual wellness visit. Come in fasting that day.

## 2018-11-09 NOTE — Telephone Encounter (Signed)
Copied from CRM [id number]. Topic: Quick Communication - Home Health Verbal Orders >> Nov 09, 2018 11:52 AM Fanny Bien wrote: Caller/Agency: Matthias Hughs calling from [facility] stated that she will go out to start services on 11/12/18 Callback Number: [phone]

## 2018-11-09 NOTE — Progress Notes (Signed)
Hospital follow-up visit     CC/Reason for Visit: Hospitalization follow-up  HPI: [name] is a 73 y.o. female who is coming in today for the above mentioned reasons, specifically transitional care services face-to-face visit.    Dates hospitalized: 11/01/2018-11/04/2018 Days since discharge from hospital: 5 Patient was discharged from the hospital to: Home Reason for admission to hospital: Shortness of breath, COPD exacerbation and community-acquired pneumonia Date of interactive phone contact with patient and/or caregiver: 11/04/2018  I have reviewed in detail patient's discharge summary plus pertinent specific notes, labs, and images from the hospitalization.  She was actually admitted to the hospital twice in December.  The first time was on December 26 where she was diagnosed with acute on chronic hypoxemic respiratory failure presumably due to community-acquired pneumonia.  She was discharged home on December 28 with a course of antibiotics.  She states after she was discharged she was improving, however she started to have a panic attack and difficulty breathing.  She was scared to stay home alone.  She presented back to the emergency department on 12/30 and was admitted for presumed COPD exacerbation.  During the second hospitalization she was transiently on Levaquin, however that was subsequently discontinued due to normal procalcitonin levels.  She was discharged with a course of prednisone which she has now completed.  She tells me she lost the medication list that she was given upon discharge and is wondering if I can provide her with a new medication list.  She is doing well and has no complaints at today's visit.  She is accompanied by a close friend who is assisting her with medications at home.   Medication reconciliation was done today and patient is taking meds as recommended by discharging hospitalist/specialist with the exception of budesonide which her insurance  did not cover and she was unable to fill due to high cost.   Past Medical/Surgical History: Past Medical History:  Diagnosis Date  . Acute bronchitis 09/25/2015  . ACUTE ON CHRONIC DIASTOLIC HEART FAILURE 12/04/2010   Qualifier: Diagnosis of  By: Ladona Ridgel, MD, [facility], [place]   . Arthritis    OA RIGHT KNEE WITH PAIN  . Barrett esophagus   . Bradycardia 06/01/2015  . CHF (congestive heart failure) (HCC)   . Chronic respiratory failure (HCC)   . Chronic respiratory failure with hypoxia and hypercapnia (HCC) 02/04/2010   Followed in Pulmonary clinic/ [facility]/ [facility]       - 02 dependent  since 07/02/10 >>  83% RA December 05, 2010       - ONO RA 08/05/12  :  Positive sat < 89 x 2:64m> repeat on 2lpm rec 08/12/2012  - 06/17/2013 reported desat with activity p Knee surgery > rec restart 2lpm with activity  - 06/27/2013   Walked 2lpm  x one lap @ 185 stopped due to sat 88% not sob , desat to 82% on RA just at th  . COPD (chronic obstructive pulmonary disease) (HCC)   . COPD III spirometry if use FEV1/VC p saba  07/18/2010   Quit smoking May 2006       - PFT's  04/12/10 FEV1  1.21 (69%) ratio 77 and no change p B2,  DLC0 56%   VC 70%         - PFTs  08/08/2013 FEV1 1.21 (60%) ratio 86 and no change p B2 DLCO 79%  VC 72%  On symbicort 160 2bid  - PFT's  02/08/2018  FEV1 0.70 (40 % ) ratio 56 if use FEV1/VC  p 38 % improvement from saba p symb 160 prior to study with DLCO  78 % corrects to 147  % for alv volume   - 02/08/2018  . Cough variant asthma 02/26/2011   Followed in Pulmonary clinic/ [facility]/ [facility]  - PFT's  06/04/15  FEV1 1.20 (67 % ) ratio 83  p 6 % improvement from saba with DLCO  80 % corrects to 132 % for alv volume      - Clinical dx based on response to symbicort       FENO 09/16/2016  =   96 on symbicort 160 2bid > added singulair  Allergy profile 09/16/2016 >  Eos 0.5 /  IgE  78 neg RAST  -  Referred to rehab 04/29/2017 > completed  . DOE (dyspnea on exertion) 02/19/2016  . Essential  hypertension 04/20/2007   Qualifier: Diagnosis of  By: Marcelyn Ditty RN, Katy Fitch   . GERD (gastroesophageal reflux disease)   . History of ARDS 2006  . History of home oxygen therapy    AT NIGHT WHEN SLEEPING 2 L / MIN NASAL CANNULA  . Hyperlipidemia 07/12/2015  . Hypertension   . Hypothyroidism   . Morbid (severe) obesity due to excess calories (HCC) 04/22/2015   pfts with erv 14% 06/04/15  And 33% 02/08/2018   . NSTEMI (non-ST elevated myocardial infarction) (HCC) 05/31/2015  . Pneumococcal pneumonia (HCC) 2006   HOSPITALIZED AND DEVELOPED ARDS  . Psoriatic arthritis (HCC)   . Pulmonary fibrosis (HCC)   . PULMONARY FIBROSIS ILD POST INFLAMMATORY CHRONIC 07/18/2010   Followed as Primary Care Patient/ [facility]/ [facility]  -s/p ARDS 2006 with bacteremic S  Pna       - CT chest 07/03/10 Nonspecific PF mostly upper lobes       - CT chest 12/03/10 acute gg changes and effusions c/w chf - PFT's  02/08/2018  FVC 0.64 (28 %)   with DLCO  78 % corrects to 147 % for alv volume     . Rheumatic disease   . 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 SOB (shortness of breath) on exertion   . Stroke [facility])     Past Surgical History:  Procedure Laterality Date  . ABDOMINOPLASTY    . CARDIAC CATHETERIZATION N/A 06/01/2015   Procedure: Left Heart Cath and Coronary Angiography;  Surgeon: Runell Gess, MD;  Location: [facility] INVASIVE CV LAB;  Service: Cardiovascular;  Laterality: N/A;  . CARPAL TUNNEL RELEASE    . CHOLECYSTECTOMY    . CORONARY ARTERY BYPASS GRAFT N/A 06/04/2015   Procedure: CORONARY ARTERY BYPASS GRAFT times three            with left internal mammary artery and right leg saphenous vein;  Surgeon: Alleen Borne, MD;  Location: [facility] OR;  Service: Open Heart Surgery;  Laterality: N/A;  . cosmetic breast surgery    . JOINT REPLACEMENT    . KNEE ARTHROSCOPY Left   . TEE WITHOUT CARDIOVERSION  06/04/2015   Procedure: TRANSESOPHAGEAL ECHOCARDIOGRAM (TEE);  Surgeon: Alleen Borne, MD;  Location: [facility] OR;  Service: Open Heart  Surgery;;  . TOTAL KNEE ARTHROPLASTY Left   . TOTAL KNEE ARTHROPLASTY Right 06/06/2013   Procedure: RIGHT TOTAL KNEE ARTHROPLASTY;  Surgeon: Loanne Drilling, MD;  Location: [facility] ORS;  Service: Orthopedics;  Laterality: Right;    Social History:  reports that she quit smoking about 13 years ago. Her smoking  use included cigarettes. She has a 87.75 pack-year smoking history. She has never used smokeless tobacco. She reports that she does not drink alcohol or use drugs.  Allergies: No Known Allergies  Family History:  Family History  Problem Relation Age of Onset  . Breast cancer Mother   . Coronary artery disease Father   . Rheum arthritis Father      Current Outpatient Medications:  .  acetaminophen (TYLENOL ARTHRITIS PAIN) 650 MG CR tablet, Take 1,300 mg by mouth 2 (two) times daily. , Disp: , Rfl:  .  albuterol (PROAIR HFA) 108 (90 Base) MCG/ACT inhaler, 2 puffs every 4 hours as needed only  if your can't catch your breath (Patient taking differently: Inhale 2 puffs into the lungs every 4 (four) hours as needed for shortness of breath (if you can't catch your breath). ), Disp: 1 Inhaler, Rfl: 11 .  albuterol (PROVENTIL) (2.5 MG/3ML) 0.083% nebulizer solution, Take 3 mLs (2.5 mg total) by nebulization every 4 (four) hours as needed for shortness of breath., Disp: 75 mL, Rfl: 0 .  aspirin 81 MG tablet, Take 1 tablet (81 mg total) by mouth daily., Disp: 30 tablet, Rfl: 0 .  atorvastatin (LIPITOR) 40 MG tablet, Take 1 tablet (40 mg total) by mouth daily at 6 PM., Disp: 90 tablet, Rfl: 3 .  Cholecalciferol (VITAMIN D3) 2000 UNITS TABS, Take 2,000 Int'l Units by mouth daily., Disp: , Rfl:  .  citalopram (CELEXA) 40 MG tablet, TAKE 1 TABLET BY MOUTH  EVERY MORNING (Patient taking differently: Take 40 mg by mouth daily. ), Disp: 90 tablet, Rfl: 0 .  furosemide (LASIX) 20 MG tablet, TAKE 1 TABLET BY MOUTH TWO  TIMES DAILY (Patient taking differently: Take 20 mg by mouth 2 (two) times daily. ), Disp:  180 tablet, Rfl: 2 .  golimumab (SIMPONI ARIA) 50 MG/4ML SOLN injection, Inject 50 mg into the vein every 8 (eight) weeks. , Disp: , Rfl:  .  levothyroxine (SYNTHROID, LEVOTHROID) 150 MCG tablet, TAKE 1 TABLET BY MOUTH  DAILY BEFORE BREAKFAST (Patient taking differently: Take 150 mcg by mouth daily before breakfast. ), Disp: 90 tablet, Rfl: 2 .  losartan (COZAAR) 50 MG tablet, Take 1 tablet (50 mg total) by mouth daily., Disp: 90 tablet, Rfl: 3 .  montelukast (SINGULAIR) 10 MG tablet, TAKE 1 TABLET BY MOUTH AT  BEDTIME (Patient taking differently: Take 10 mg by mouth at bedtime. ), Disp: 90 tablet, Rfl: 3 .  OXYGEN, Inhale 2 L into the lungs continuous. continuous o2 , Disp: , Rfl:  .  pantoprazole (PROTONIX) 40 MG tablet, Take 1 tablet (40 mg total) by mouth daily before breakfast., Disp: 90 tablet, Rfl: 1 .  SYMBICORT 160-4.5 MCG/ACT inhaler, USE 2 PUFFS TWO TIMES DAILY (Patient taking differently: Inhale 2 puffs into the lungs 2 (two) times daily. ), Disp: 30.6 g, Rfl: 1 .  traMADol (ULTRAM) 50 MG tablet, TAKE 1-2 TABLETS EVERY 6 HOURS AS NEEDED FOR PAIN (Patient taking differently: Take 50 mg by mouth 2 (two) times daily. ), Disp: 90 tablet, Rfl: 0 .  triamcinolone cream (KENALOG) 0.1 %, Apply 1 application topically 2 (two) times daily as needed (for psoriasis). , Disp: , Rfl:  .  tiotropium (SPIRIVA HANDIHALER) 18 MCG inhalation capsule, Place 1 capsule (18 mcg total) into inhaler and inhale daily., Disp: 30 capsule, Rfl: 12  Review of Systems:  Constitutional: Denies fever, chills, diaphoresis, appetite change and fatigue.  HEENT: Denies photophobia, eye pain, redness, hearing loss, ear  pain, congestion, sore throat, rhinorrhea, sneezing, mouth sores, trouble swallowing, neck pain, neck stiffness and tinnitus.   Respiratory: Denies SOB, DOE, cough, chest tightness,  and wheezing.   Cardiovascular: Denies chest pain, palpitations and leg swelling.  Gastrointestinal: Denies nausea, vomiting,  abdominal pain, diarrhea, constipation, blood in stool and abdominal distention.  Genitourinary: Denies dysuria, urgency, frequency, hematuria, flank pain and difficulty urinating.  Endocrine: Denies: hot or cold intolerance, sweats, changes in hair or nails, polyuria, polydipsia. Musculoskeletal: Denies myalgias, back pain, joint swelling, arthralgias and gait problem.  Skin: Denies pallor, rash and wound.  Neurological: Denies dizziness, seizures, syncope, weakness, light-headedness, numbness and headaches.  Hematological: Denies adenopathy. Easy bruising, personal or family bleeding history  Psychiatric/Behavioral: Denies suicidal ideation, mood changes, confusion, nervousness, sleep disturbance and agitation    Physical Exam: Vitals:   11/09/18 1528  BP: 110/80  Pulse: 79  Temp: 100.3 F (37.9 C)  TempSrc: Oral  SpO2: 95%  Weight: 209 lb 4.8 oz (94.9 kg)    Body mass index is 42.27 kg/m.   Constitutional: NAD, calm, comfortable Eyes: PERRL, lids and conjunctivae normal ENMT: Mucous membranes are moist.  Respiratory: clear to auscultation bilaterally, no wheezing, no crackles. Normal respiratory effort. No accessory muscle use.  Cardiovascular: Regular rate and rhythm, positive systolic ejection murmur best heard at the right upper sternal border, no rubs / gallops. No extremity edema. 2+ pedal pulses. No carotid bruits.  Abdomen: no tenderness, no masses palpated. No hepatosplenomegaly. Bowel sounds positive.  Musculoskeletal: no clubbing / cyanosis. No joint deformity upper and lower extremities. Good ROM, no contractures. Normal muscle tone.  Skin: no rashes, lesions, ulcers. No induration Neurologic: CN 2-12 grossly intact. Sensation intact, DTR normal. Strength 5/5 in all 4.  Psychiatric: Normal judgment and insight. Alert and oriented x 3. Normal mood.    Impression and Plan:  Chronic respiratory failure with hypoxia and hypercapnia (HCC) -  Community-acquired  pneumonia COPD with recent acute exacerbation -Patient is doing well posthospitalization.  She has completed prednisone course.  She has completed antibiotic course. -Currently she is only taking Symbicort and as needed albuterol for her COPD.  She states the albuterol, especially in the nebulizer form, makes her very jittery and shaky. -Will add Spiriva to optimize her COPD regimen. -She has been advised to follow-up with her pulmonologist as scheduled.  She will follow-up with me in 3 to 4 months for routine medical care.     Patient Instructions  -It was nice seeing you today!  -Glad you are feeling better after your hospitalization.  -Continue taking symbicort twice a day and add spiriva once a day. Albuterol as needed for shortness of breath in nebulizer form or inhaler.  -Please schedule followup in 3-4 months for your annual wellness visit. Come in fasting that day.     Chaya Jan, MD [place] [name]

## 2018-11-10 ENCOUNTER — Telehealth: Payer: Self-pay | Admitting: Internal Medicine

## 2018-11-10 NOTE — Telephone Encounter (Signed)
Called patient, phone rang once and went to voicemail. Left message to give [place] a call back.

## 2018-11-10 NOTE — Telephone Encounter (Signed)
Copied from CRM [id number]. Topic: Quick Communication - Rx Refill/Question >> Nov 10, 2018 11:03 AM Maia Petties wrote: Medication: tiotropium (SPIRIVA HANDIHALER) 18 MCG inhalation capsule - pt notes spiriva or Tiova is not covered by insurance - pt is asking if there is an alternative

## 2018-11-10 NOTE — Telephone Encounter (Signed)
Advised patient to call her insurance company and her pulmonologist.

## 2018-11-12 ENCOUNTER — Telehealth: Payer: Self-pay | Admitting: Internal Medicine

## 2018-11-12 NOTE — Telephone Encounter (Signed)
[facility] notified

## 2018-11-12 NOTE — Telephone Encounter (Signed)
Ok by me

## 2018-11-12 NOTE — Telephone Encounter (Signed)
Called and spoke with pt. Pt stated that the spiriva was too much for her due to it not being covered by insurance. Stated to pt to call insurance company to obtain a formulary list. Pt expressed understanding. Nothing further needed.

## 2018-11-12 NOTE — Telephone Encounter (Signed)
Called and spoke with patient in regards to phone note dated 11/10/2018. Patient spoke with her insurance and she was told that the inhaler Spiriva is approved under her united healthcare plan. Patient called her pharmacy and they ran it and see that it is approved. Patient called her PCP and they will be prescribing it. Nothing further needed.

## 2018-11-12 NOTE — Telephone Encounter (Signed)
Copied from CRM [id number]. Topic: Quick Communication - Home Health Verbal Orders >> Nov 12, 2018  9:43 AM Windy Kalata, NT wrote: Caller/Agency: [facility] Number: [phone] Requesting OT/PT/Skilled Nursing/Social Work:  Frequency:   Has been having trouble reaching the patient but will be going out to the see the patient starting 11/16/18 for nursing.

## 2018-11-16 ENCOUNTER — Ambulatory Visit: Payer: Self-pay | Admitting: *Deleted

## 2018-11-16 NOTE — Telephone Encounter (Signed)
Pt having complaints of bilateral ankle swelling since last night with the left being worse than the right. Pt denies any other symptoms at this time. Pt states she is currently taking Furosemide 20 mg in the am and 20 mg in the pm. No availability with PCP the rest of the week. Pt scheduled with Dr. Clent Ridges on 11/17/18 at 9:30 am.  Reason for Disposition . [1] MILD swelling of both ankles (i.e., pedal edema) AND [2] new onset or worsening  Answer Assessment - Initial Assessment Questions 1. ONSET: "When did the swelling start?" (e.g., minutes, hours, days)     Last night 2. LOCATION: "What part of the leg is swollen?"  "Are both legs swollen or just one leg?"     Starts at ankle right at the bend of ankle, toes not swollen and does not go up the leg 3. SEVERITY: "How bad is the swelling?" (e.g., localized; mild, moderate, severe)  - Localized - small area of swelling localized to one leg  - MILD pedal edema - swelling limited to foot and ankle, pitting edema < 1/4 inch (6 mm) deep, rest and elevation eliminate most or all swelling  - MODERATE edema - swelling of lower leg to knee, pitting edema > 1/4 inch (6 mm) deep, rest and elevation only partially reduce swelling  - SEVERE edema - swelling extends above knee, facial or hand swelling present      mild 4. REDNESS: "Does the swelling look red or infected?"     No 5. PAIN: "Is the swelling painful to touch?" If so, ask: "How painful is it?"   (Scale 1-10; mild, moderate or severe)     No 6. FEVER: "Do you have a fever?" If so, ask: "What is it, how was it measured, and when did it start?"      No 7. CAUSE: "What do you think is causing the leg swelling?"     unknown 8. MEDICAL HISTORY: "Do you have a history of heart failure, kidney disease, liver failure, or cancer?"     On the nebulizer twice a day, HTN, heart failure with  triple bypass, COPD 9. RECURRENT SYMPTOM: "Have you had leg swelling before?" If so, ask: "When was the last time?" "What  happened that time?"     Yes in the past has experienced swollen ankles 10. OTHER SYMPTOMS: "Do you have any other symptoms?" (e.g., chest pain, difficulty breathing)       No 11. PREGNANCY: "Is there any chance you are pregnant?" "When was your last menstrual period?"       n/a  Protocols used: LEG SWELLING AND EDEMA-A-AH

## 2018-11-17 ENCOUNTER — Encounter: Payer: Self-pay | Admitting: Family Medicine

## 2018-11-17 ENCOUNTER — Ambulatory Visit (INDEPENDENT_AMBULATORY_CARE_PROVIDER_SITE_OTHER): Payer: Medicare Other | Admitting: Family Medicine

## 2018-11-17 VITALS — BP 138/64 | HR 98 | Temp 98.8°F | Wt 207.2 lb

## 2018-11-17 DIAGNOSIS — J8410 Pulmonary fibrosis, unspecified: Secondary | ICD-10-CM | POA: Diagnosis not present

## 2018-11-17 DIAGNOSIS — Z79899 Other long term (current) drug therapy: Secondary | ICD-10-CM | POA: Diagnosis not present

## 2018-11-17 DIAGNOSIS — I252 Old myocardial infarction: Secondary | ICD-10-CM | POA: Diagnosis not present

## 2018-11-17 DIAGNOSIS — J441 Chronic obstructive pulmonary disease with (acute) exacerbation: Secondary | ICD-10-CM

## 2018-11-17 DIAGNOSIS — J189 Pneumonia, unspecified organism: Secondary | ICD-10-CM | POA: Diagnosis not present

## 2018-11-17 DIAGNOSIS — I10 Essential (primary) hypertension: Secondary | ICD-10-CM

## 2018-11-17 DIAGNOSIS — M150 Primary generalized (osteo)arthritis: Secondary | ICD-10-CM | POA: Diagnosis not present

## 2018-11-17 DIAGNOSIS — Z6841 Body Mass Index (BMI) 40.0 and over, adult: Secondary | ICD-10-CM | POA: Diagnosis not present

## 2018-11-17 DIAGNOSIS — M8580 Other specified disorders of bone density and structure, unspecified site: Secondary | ICD-10-CM | POA: Diagnosis not present

## 2018-11-17 DIAGNOSIS — M0589 Other rheumatoid arthritis with rheumatoid factor of multiple sites: Secondary | ICD-10-CM | POA: Diagnosis not present

## 2018-11-17 DIAGNOSIS — I5033 Acute on chronic diastolic (congestive) heart failure: Secondary | ICD-10-CM

## 2018-11-17 DIAGNOSIS — L409 Psoriasis, unspecified: Secondary | ICD-10-CM | POA: Diagnosis not present

## 2018-11-17 NOTE — Progress Notes (Signed)
   Subjective:    Patient ID: [name], female    DOB: [birth year], 73 y.o.   MRN: [id number]  HPI Here to follow up on 2 recent hospital stays and for recent ankle swelling. She was admitted from 10-28-18 to 10-30-18 for a community acquired pneumonia and she was treated with Levaquin. She was then admitted again from 11-01-18 to 11-04-18 for an acute exacerbation of COPD. She was treated with nebulizers and IV steroids. She felt much better when she went home. However in the past 2 days she as noticed some swelling in both ankles in the evenings, and this will go down again overnight. No coughing or fever. No more SOB than usual. She has COPD and pulmonary fibrosis and she wears continuous [place] oxygen at 2 liters. She has mild diastolic cardiac dysfunction as seen on an ECHO last October, but her LVEF was 65-70%. She has stage 3 CKD. Her last creatinine in the hospital was stable at 1.25. She normally takes Lasix 20 mg bid.    Review of Systems  Constitutional: Negative.   Respiratory: Positive for shortness of breath. Negative for cough, choking and wheezing.   Cardiovascular: Positive for leg swelling. Negative for chest pain and palpitations.  Neurological: Negative.        Objective:   Physical Exam Constitutional:      General: She is not in acute distress. Cardiovascular:     Rate and Rhythm: Normal rate and regular rhythm.     Pulses: Normal pulses.     Heart sounds: Normal heart sounds.  Pulmonary:     Effort: Pulmonary effort is normal. No respiratory distress.     Breath sounds: No stridor. No wheezing or rhonchi.     Comments: There are rales at both bases  Musculoskeletal:     Right lower leg: No edema.     Left lower leg: No edema.  Lymphadenopathy:     Cervical: No cervical adenopathy.  Neurological:     General: No focal deficit present.     Mental Status: She is alert and oriented to person, place, and time.           Assessment & Plan:  This is a  patient with COPD, PF, CAD, and  mild diastolic HF who has been recently treated with a lot of IV fluids and steroids. I think she has a mild fluid overload, and I advised her to increase the Lasix to 2 tabs (40 mg) bid for a few days. She is to follow up with Dr. Ardyth Harps early next week, and I anticipate that the Lasix can be decreased to her usual dosage at that time.  Gershon Crane, MD

## 2018-11-18 ENCOUNTER — Encounter: Payer: Self-pay | Admitting: Internal Medicine

## 2018-11-19 NOTE — Telephone Encounter (Signed)
Pt is wanting to know if she is to continue the nebulizer given while she was in the hospital 11/04/2018 Pt is almost out and needs refills if so. If not she is wanting to discuss next steps in treatment.   Please advise

## 2018-11-22 ENCOUNTER — Other Ambulatory Visit: Payer: Self-pay | Admitting: Cardiology

## 2018-11-24 ENCOUNTER — Encounter: Payer: Self-pay | Admitting: Internal Medicine

## 2018-11-24 ENCOUNTER — Ambulatory Visit (INDEPENDENT_AMBULATORY_CARE_PROVIDER_SITE_OTHER): Payer: Medicare Other | Admitting: Internal Medicine

## 2018-11-24 VITALS — BP 120/84 | HR 88 | Temp 98.4°F | Wt 205.7 lb

## 2018-11-24 DIAGNOSIS — M7061 Trochanteric bursitis, right hip: Secondary | ICD-10-CM

## 2018-11-24 DIAGNOSIS — R600 Localized edema: Secondary | ICD-10-CM

## 2018-11-24 DIAGNOSIS — R252 Cramp and spasm: Secondary | ICD-10-CM

## 2018-11-24 LAB — BASIC METABOLIC PANEL
BUN: 28 mg/dL — ABNORMAL HIGH (ref 6–23)
CO2: 37 mEq/L — ABNORMAL HIGH (ref 19–32)
Calcium: 9.8 mg/dL (ref 8.4–10.5)
Chloride: 95 mEq/L — ABNORMAL LOW (ref 96–112)
Creatinine, Ser: 1.17 mg/dL (ref 0.40–1.20)
GFR: 45.51 mL/min — ABNORMAL LOW (ref >60.00)
Glucose, Bld: 127 mg/dL — ABNORMAL HIGH (ref 70–99)
Potassium: 3.9 mEq/L (ref 3.5–5.1)
Sodium: 138 mEq/L (ref 135–145)

## 2018-11-24 NOTE — Progress Notes (Signed)
Established Patient Office Visit     CC/Reason for Visit: Follow-up on lower extremity edema  HPI: [name] is a 73 y.o. female who is coming in today for the above mentioned reasons.  She was seen in the clinic last week as a work in by different provider due to bilateral lower extremity edema.  The month of December she was hospitalized twice due to COPD exacerbation and presumably received some fluids.  Because of this her Lasix was increased from 20 mg twice daily to 40 mg twice daily and she was instructed to come back today.  She states that the fluid has completely gone down.  She has noticed though that since starting the Lasix she has had cramping and numbness of both legs.  She also states that for the past couple of weeks she has been having pain over the right side of her hip, tender to palpation.  She notes that she recently got an adjustable bed and has been placing a lot of pillows over her right hip to avoid rolling over as she has fallen in the past.  Other than this no complaints.  Past Medical/Surgical History: Past Medical History:  Diagnosis Date  . Acute bronchitis 09/25/2015  . ACUTE ON CHRONIC DIASTOLIC HEART FAILURE 12/04/2010   Qualifier: Diagnosis of  By: Ladona Ridgel, MD, [facility], Vergia Alcon   . Arthritis    OA RIGHT KNEE WITH PAIN  . Barrett esophagus   . Bradycardia 06/01/2015  . CHF (congestive heart failure) (HCC)   . Chronic respiratory failure (HCC)   . Chronic respiratory failure with hypoxia and hypercapnia (HCC) 02/04/2010   Followed in Pulmonary clinic/ [facility]/ [facility]       - 02 dependent  since 07/02/10 >>  83% RA December 05, 2010       - ONO RA 08/05/12  :  Positive sat < 89 x 2:28m> repeat on 2lpm rec 08/12/2012  - 06/17/2013 reported desat with activity p Knee surgery > rec restart 2lpm with activity  - 06/27/2013   Walked 2lpm  x one lap @ 185 stopped due to sat 88% not sob , desat to 82% on RA just at th  . COPD (chronic obstructive  pulmonary disease) (HCC)   . COPD III spirometry if use FEV1/VC p saba  07/18/2010   Quit smoking May 2006       - PFT's  04/12/10 FEV1  1.21 (69%) ratio 77 and no change p B2,  DLC0 56%   VC 70%         - PFTs  08/08/2013 FEV1 1.21 (60%) ratio 86 and no change p B2 DLCO 79%  VC 72%  On symbicort 160 2bid  - PFT's  02/08/2018  FEV1 0.70 (40 % ) ratio 56 if use FEV1/VC  p 38 % improvement from saba p symb 160 prior to study with DLCO  78 % corrects to 147  % for alv volume   - 02/08/2018  . Cough variant asthma 02/26/2011   Followed in Pulmonary clinic/ [facility]/ [facility]  - PFT's  06/04/15  FEV1 1.20 (67 % ) ratio 83  p 6 % improvement from saba with DLCO  80 % corrects to 132 % for alv volume      - Clinical dx based on response to symbicort       FENO 09/16/2016  =   96 on symbicort 160 2bid > added singulair  Allergy profile 09/16/2016 >  Eos 0.5 /  IgE  78 neg RAST  -  Referred to rehab 04/29/2017 > completed  . DOE (dyspnea on exertion) 02/19/2016  . Essential hypertension 04/20/2007   Qualifier: Diagnosis of  By: Marcelyn DittyMondi, RN, Katy FitchJudy Ann   . GERD (gastroesophageal reflux disease)   . History of ARDS 2006  . History of home oxygen therapy    AT NIGHT WHEN SLEEPING 2 L / MIN NASAL CANNULA  . Hyperlipidemia 07/12/2015  . Hypertension   . Hypothyroidism   . Morbid (severe) obesity due to excess calories (HCC) 04/22/2015   pfts with erv 14% 06/04/15  And 33% 02/08/2018   . NSTEMI (non-ST elevated myocardial infarction) (HCC) 05/31/2015  . Pneumococcal pneumonia (HCC) 2006   HOSPITALIZED AND DEVELOPED ARDS  . Psoriatic arthritis (HCC)   . Pulmonary fibrosis (HCC)   . PULMONARY FIBROSIS ILD POST INFLAMMATORY CHRONIC 07/18/2010   Followed as Primary Care Patient/ [facility]/ [facility]  -s/p ARDS 2006 with bacteremic S  Pna       - CT chest 07/03/10 Nonspecific PF mostly upper lobes       - CT chest 12/03/10 acute gg changes and effusions c/w chf - PFT's  02/08/2018  FVC 0.64 (28 %)   with DLCO  78 % corrects to 147 %  for alv volume     . Rheumatic disease   . 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 SOB (shortness of breath) on exertion   . Stroke [facility](HCC)     Past Surgical History:  Procedure Laterality Date  . ABDOMINOPLASTY    . CARDIAC CATHETERIZATION N/A 06/01/2015   Procedure: Left Heart Cath and Coronary Angiography;  Surgeon: Runell GessJonathan J Berry, MD;  [facility];  Service: Cardiovascular;  Laterality: N/A;  . CARPAL TUNNEL RELEASE    . CHOLECYSTECTOMY    . CORONARY ARTERY BYPASS GRAFT N/A 06/04/2015   Procedure: CORONARY ARTERY BYPASS GRAFT times three            with left internal mammary artery and right leg saphenous vein;  Surgeon: Alleen BorneBryan K Bartle, MD;  Location: [facility] OR;  Service: Open Heart Surgery;  Laterality: N/A;  . cosmetic breast surgery    . JOINT REPLACEMENT    . KNEE ARTHROSCOPY Left   . TEE WITHOUT CARDIOVERSION  06/04/2015   Procedure: TRANSESOPHAGEAL ECHOCARDIOGRAM (TEE);  Surgeon: Alleen BorneBryan K Bartle, MD;  [facility];  Service: Open Heart Surgery;;  . TOTAL KNEE ARTHROPLASTY Left   . TOTAL KNEE ARTHROPLASTY Right 06/06/2013   Procedure: RIGHT TOTAL KNEE ARTHROPLASTY;  Surgeon: Loanne DrillingFrank V [name], MD;  Location: [facility] ORS;  Service: Orthopedics;  Laterality: Right;    Social History:  reports that she quit smoking about 13 years ago. Her smoking use included cigarettes. She has a 87.75 pack-year smoking history. She has never used smokeless tobacco. She reports that she does not drink alcohol or use drugs.  Allergies: No Known Allergies  Family History:  Family History  Problem Relation Age of Onset  . Breast cancer Mother   . Coronary artery disease Father   . Rheum arthritis Father      Current Outpatient Medications:  .  acetaminophen (TYLENOL ARTHRITIS PAIN) 650 MG CR tablet, Take 1,300 mg by mouth 2 (two) times daily. , Disp: , Rfl:  .  albuterol (PROAIR HFA) 108 (90 Base) MCG/ACT inhaler, 2 puffs every 4 hours as needed only  if your can't catch your breath (Patient taking  differently: Inhale 2 puffs  into the lungs every 4 (four) hours as needed for shortness of breath (if you can't catch your breath). ), Disp: 1 Inhaler, Rfl: 11 .  albuterol (PROVENTIL) (2.5 MG/3ML) 0.083% nebulizer solution, Take 3 mLs (2.5 mg total) by nebulization every 4 (four) hours as needed for shortness of breath., Disp: 75 mL, Rfl: 0 .  aspirin 81 MG tablet, Take 1 tablet (81 mg total) by mouth daily., Disp: 30 tablet, Rfl: 0 .  atorvastatin (LIPITOR) 40 MG tablet, Take 1 tablet (40 mg total) by mouth daily at 6 PM., Disp: 90 tablet, Rfl: 3 .  Cholecalciferol (VITAMIN D3) 2000 UNITS TABS, Take 2,000 Int'l Units by mouth daily., Disp: , Rfl:  .  citalopram (CELEXA) 40 MG tablet, TAKE 1 TABLET BY MOUTH  EVERY MORNING (Patient taking differently: Take 40 mg by mouth daily. ), Disp: 90 tablet, Rfl: 0 .  furosemide (LASIX) 20 MG tablet, TAKE 1 TABLET BY MOUTH TWO  TIMES DAILY (Patient taking differently: Take 20 mg by mouth 2 (two) times daily. ), Disp: 180 tablet, Rfl: 2 .  golimumab (SIMPONI ARIA) 50 MG/4ML SOLN injection, Inject 50 mg into the vein every 8 (eight) weeks. , Disp: , Rfl:  .  levothyroxine (SYNTHROID, LEVOTHROID) 150 MCG tablet, TAKE 1 TABLET BY MOUTH  DAILY BEFORE BREAKFAST, Disp: 90 tablet, Rfl: 2 .  losartan (COZAAR) 50 MG tablet, Take 1 tablet (50 mg total) by mouth daily., Disp: 90 tablet, Rfl: 3 .  montelukast (SINGULAIR) 10 MG tablet, TAKE 1 TABLET BY MOUTH AT  BEDTIME (Patient taking differently: Take 10 mg by mouth at bedtime. ), Disp: 90 tablet, Rfl: 3 .  OXYGEN, Inhale 2 L into the lungs continuous. continuous o2 , Disp: , Rfl:  .  pantoprazole (PROTONIX) 40 MG tablet, Take 1 tablet (40 mg total) by mouth daily before breakfast., Disp: 90 tablet, Rfl: 1 .  SYMBICORT 160-4.5 MCG/ACT inhaler, USE 2 PUFFS TWO TIMES DAILY (Patient taking differently: Inhale 2 puffs into the lungs 2 (two) times daily. ), Disp: 30.6 g, Rfl: 1 .  tiotropium (SPIRIVA HANDIHALER) 18 MCG inhalation  capsule, Place 1 capsule (18 mcg total) into inhaler and inhale daily., Disp: 30 capsule, Rfl: 12 .  traMADol (ULTRAM) 50 MG tablet, TAKE 1-2 TABLETS EVERY 6 HOURS AS NEEDED FOR PAIN (Patient taking differently: Take 50 mg by mouth 2 (two) times daily. ), Disp: 90 tablet, Rfl: 0 .  triamcinolone cream (KENALOG) 0.1 %, Apply 1 application topically 2 (two) times daily as needed (for psoriasis). , Disp: , Rfl:   Review of Systems:  Constitutional: Denies fever, chills, diaphoresis, appetite change and fatigue.  HEENT: Denies photophobia, eye pain, redness, hearing loss, ear pain, congestion, sore throat, rhinorrhea, sneezing, mouth sores, trouble swallowing, neck pain, neck stiffness and tinnitus.   Respiratory: Denies SOB, DOE, cough, chest tightness,  and wheezing.   Cardiovascular: Denies chest pain, palpitations and leg swelling.  Gastrointestinal: Denies nausea, vomiting, abdominal pain, diarrhea, constipation, blood in stool and abdominal distention.  Genitourinary: Denies dysuria, urgency, frequency, hematuria, flank pain and difficulty urinating.  Endocrine: Denies: hot or cold intolerance, sweats, changes in hair or nails, polyuria, polydipsia. Musculoskeletal: Denies myalgias, back pain, joint swelling, arthralgias and gait problem.  Skin: Denies pallor, rash and wound.  Neurological: Denies dizziness, seizures, syncope, weakness, light-headedness, numbness and headaches.  Hematological: Denies adenopathy. Easy bruising, personal or family bleeding history  Psychiatric/Behavioral: Denies suicidal ideation, mood changes, confusion, nervousness, sleep disturbance and agitation    Physical Exam:  Vitals:   11/24/18 1326  BP: 120/84  Pulse: 88  Temp: 98.4 F (36.9 C)  TempSrc: Oral  SpO2: 94%  Weight: 205 lb 11.2 oz (93.3 kg)    Body mass index is 41.55 kg/m.   Constitutional: NAD, calm, comfortable Eyes: PERRL, lids and conjunctivae normal ENMT: Mucous membranes are  moist. Respiratory: clear to auscultation bilaterally, no wheezing, no crackles. Normal respiratory effort. No accessory muscle use.  Cardiovascular: Regular rate and rhythm, no murmurs / rubs / gallops. No extremity edema. 2+ pedal pulses. No carotid bruits.  Musculoskeletal: no clubbing / cyanosis. No joint deformity upper and lower extremities. Good ROM, no contractures. Normal muscle tone.  Skin: no rashes, lesions, ulcers. No induration Neurologic: CN 2-12 grossly intact. Sensation intact, DTR normal. Strength 5/5 in all 4.  Psychiatric: Normal judgment and insight. Alert and oriented x 3. Normal mood.    Impression and Plan:  Bilateral lower extremity edema  -Improved after increasing Lasix dose, -Back down on Lasix to her usual dose of 20 mg twice.  Bilateral leg cramps  -Suspect possibly related to hypokalemia due to increase in Lasix dose. -Basic metabolic profile today to evaluate.  Greater trochanteric bursitis of right hip -Patient only has pain when she applies pressure to her right lateral hip, making this a likely diagnosis. -Have advised local measures such as icing and NSAIDs as needed, if becomes a significant issue we could refer to orthopedics for consideration of injections.    Patient Instructions  -It was great seeing you today!  -Go back to taking your lasix 20 mg 1 tablet twice daily.  -Schedule follow up in 3 months.     Chaya Jan, MD [facility]

## 2018-11-24 NOTE — Patient Instructions (Signed)
-  It was great seeing you today!  -Go back to taking your lasix 20 mg 1 tablet twice daily.  -Schedule follow up in 3 months.

## 2018-11-25 ENCOUNTER — Encounter: Payer: Self-pay | Admitting: Internal Medicine

## 2018-11-25 DIAGNOSIS — J9611 Chronic respiratory failure with hypoxia: Secondary | ICD-10-CM

## 2018-11-25 DIAGNOSIS — J9612 Chronic respiratory failure with hypercapnia: Principal | ICD-10-CM

## 2018-11-25 MED ORDER — TIOTROPIUM BROMIDE MONOHYDRATE 18 MCG IN CAPS: 18.0000 ug | ORAL_CAPSULE | Freq: Every day | RESPIRATORY_TRACT | 12 refills | Status: DC

## 2018-11-26 ENCOUNTER — Other Ambulatory Visit: Payer: Self-pay | Admitting: Internal Medicine

## 2018-11-26 DIAGNOSIS — R252 Cramp and spasm: Secondary | ICD-10-CM

## 2018-11-26 MED ORDER — POTASSIUM CHLORIDE CRYS ER 20 MEQ PO TBCR: 20.0000 meq | EXTENDED_RELEASE_TABLET | Freq: Two times a day (BID) | ORAL | 3 refills | Status: DC

## 2018-11-30 ENCOUNTER — Other Ambulatory Visit: Payer: Self-pay | Admitting: *Deleted

## 2018-11-30 MED ORDER — CITALOPRAM HYDROBROMIDE 40 MG PO TABS: 40.0000 mg | ORAL_TABLET | Freq: Every morning | ORAL | 1 refills | Status: DC

## 2018-12-14 DIAGNOSIS — M0589 Other rheumatoid arthritis with rheumatoid factor of multiple sites: Secondary | ICD-10-CM | POA: Diagnosis not present

## 2018-12-16 ENCOUNTER — Encounter: Payer: Self-pay | Admitting: Internal Medicine

## 2018-12-29 ENCOUNTER — Encounter: Payer: Self-pay | Admitting: Internal Medicine

## 2018-12-29 ENCOUNTER — Ambulatory Visit (INDEPENDENT_AMBULATORY_CARE_PROVIDER_SITE_OTHER): Payer: Medicare Other

## 2018-12-29 ENCOUNTER — Ambulatory Visit (INDEPENDENT_AMBULATORY_CARE_PROVIDER_SITE_OTHER): Payer: Medicare Other | Admitting: Internal Medicine

## 2018-12-29 VITALS — BP 110/70 | HR 106 | Temp 99.1°F | Ht 59.0 in | Wt 204.7 lb

## 2018-12-29 DIAGNOSIS — J189 Pneumonia, unspecified organism: Secondary | ICD-10-CM

## 2018-12-29 DIAGNOSIS — J441 Chronic obstructive pulmonary disease with (acute) exacerbation: Secondary | ICD-10-CM | POA: Diagnosis not present

## 2018-12-29 DIAGNOSIS — J181 Lobar pneumonia, unspecified organism: Secondary | ICD-10-CM

## 2018-12-29 DIAGNOSIS — R0602 Shortness of breath: Secondary | ICD-10-CM | POA: Diagnosis not present

## 2018-12-29 MED ORDER — PREDNISONE 10 MG (21) PO TBPK: ORAL_TABLET | ORAL | 0 refills | Status: DC

## 2018-12-29 MED ORDER — AZITHROMYCIN 500 MG PO TABS: 500.0000 mg | ORAL_TABLET | Freq: Every day | ORAL | 0 refills | Status: AC

## 2018-12-29 MED ORDER — CEFTRIAXONE SODIUM 1 G IJ SOLR
1.0000 g | Freq: Once | INTRAMUSCULAR | Status: AC
  Administered 2018-12-29: 1 g via INTRAMUSCULAR

## 2018-12-29 MED ORDER — CEFTRIAXONE SODIUM 1 G IJ SOLR: 1.0000 g | Freq: Once | INTRAMUSCULAR | Status: DC

## 2018-12-29 NOTE — Addendum Note (Signed)
Addended by: Kern Reap B on: 12/29/2018 03:15 PM   Modules accepted: Orders

## 2018-12-29 NOTE — Patient Instructions (Signed)
-  Hope you feel better soon!  -Start azithromycin 500 mg daily for 7 days.  -Start prednisone pack as directed for 6 days.  -Schedule follow up in 1 week.

## 2018-12-29 NOTE — Progress Notes (Signed)
Established Patient Office Visit     CC/Reason for Visit: Shortness of breath, productive cough  HPI: [name] is a 73 y.o. female who is coming in today for the above mentioned reasons. Past Medical History is significant for: Chronic respiratory failure secondary to COPD was hospitalized about 6 weeks ago for COPD exacerbation and pneumonia.  She is coming in today with a one-week history of productive cough, she states her sputum has changed and is turning yellow.  She has had a low-grade temperature of 99, does not have a sore throat, runny nose, ear pain.  Her son and granddaughter were both diagnosed with influenza a earlier this week and are on Tamiflu.  She has not had any recent travel.  She denies chest pain and feels a little short of breath, she does comment on the fact that her caregiver asked her today whether she was short of breath because she was taking deeper breaths.  She has not had to increase her oxygenation and is still on 2 L.   Past Medical/Surgical History: Past Medical History:  Diagnosis Date  . Acute bronchitis 09/25/2015  . ACUTE ON CHRONIC DIASTOLIC HEART FAILURE 12/04/2010   Qualifier: Diagnosis of  By: Ladona Ridgel, MD, [facility], [place]con   . Arthritis    OA RIGHT KNEE WITH PAIN  . Barrett esophagus   . Bradycardia 06/01/2015  . CHF (congestive heart failure) (HCC)   . Chronic respiratory failure (HCC)   . Chronic respiratory failure with hypoxia and hypercapnia (HCC) 02/04/2010   Followed in Pulmonary clinic/ [facility]/ [facility]       - 02 dependent  since 07/02/10 >>  83% RA December 05, 2010       - ONO RA 08/05/12  :  Positive sat < 89 x 2:1m> repeat on 2lpm rec 08/12/2012  - 06/17/2013 reported desat with activity p Knee surgery > rec restart 2lpm with activity  - 06/27/2013   Walked 2lpm  x one lap @ 185 stopped due to sat 88% not sob , desat to 82% on RA just at th  . COPD (chronic obstructive pulmonary disease) (HCC)   . COPD III  spirometry if use FEV1/VC p saba  07/18/2010   Quit smoking May 2006       - PFT's  04/12/10 FEV1  1.21 (69%) ratio 77 and no change p B2,  DLC0 56%   VC 70%         - PFTs  08/08/2013 FEV1 1.21 (60%) ratio 86 and no change p B2 DLCO 79%  VC 72%  On symbicort 160 2bid  - PFT's  02/08/2018  FEV1 0.70 (40 % ) ratio 56 if use FEV1/VC  p 38 % improvement from saba p symb 160 prior to study with DLCO  78 % corrects to 147  % for alv volume   - 02/08/2018  . Cough variant asthma 02/26/2011   [facility]  - PFT's  06/04/15  FEV1 1.20 (67 % ) ratio 83  p 6 % improvement from saba with DLCO  80 % corrects to 132 % for alv volume      - Clinical dx based on response to symbicort       FENO 09/16/2016  =   96 on symbicort 160 2bid > added singulair  Allergy profile 09/16/2016 >  Eos 0.5 /  IgE  78 neg RAST  -  Referred to rehab 04/29/2017 >  completed  . DOE (dyspnea on exertion) 02/19/2016  . Essential hypertension 04/20/2007   Qualifier: Diagnosis of  By: Marcelyn Ditty RN, Katy Fitch   . GERD (gastroesophageal reflux disease)   . History of ARDS 2006  . History of home oxygen therapy    AT NIGHT WHEN SLEEPING 2 L / MIN NASAL CANNULA  . Hyperlipidemia 07/12/2015  . Hypertension   . Hypothyroidism   . Morbid (severe) obesity due to excess calories (HCC) 04/22/2015   pfts with erv 14% 06/04/15  And 33% 02/08/2018   . NSTEMI (non-ST elevated myocardial infarction) (HCC) 05/31/2015  . Pneumococcal pneumonia (HCC) 2006   HOSPITALIZED AND DEVELOPED ARDS  . Psoriatic arthritis (HCC)   . Pulmonary fibrosis (HCC)   . PULMONARY FIBROSIS ILD POST INFLAMMATORY CHRONIC 07/18/2010   Followed as Primary Care Patient/ [facility]/ [facility]  -s/p ARDS 2006 with bacteremic S  Pna       - CT chest 07/03/10 Nonspecific PF mostly upper lobes       - CT chest 12/03/10 acute gg changes and effusions c/w chf - PFT's  02/08/2018  FVC 0.64 (28 %)   with DLCO  78 % corrects to 147 % for alv volume     . Rheumatic  disease   . 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 SOB (shortness of breath) on exertion   . Stroke [facility]     Past Surgical History:  Procedure Laterality Date  . ABDOMINOPLASTY    . CARDIAC CATHETERIZATION N/A 06/01/2015   Procedure: Left Heart Cath and Coronary Angiography;  Surgeon: Runell Gess, MD;  Location: [facility] INVASIVE CV LAB;  Service: Cardiovascular;  Laterality: N/A;  . CARPAL TUNNEL RELEASE    . CHOLECYSTECTOMY    . CORONARY ARTERY BYPASS GRAFT N/A 06/04/2015   Procedure: CORONARY ARTERY BYPASS GRAFT times three            with left internal mammary artery and right leg saphenous vein;  Surgeon: Alleen Borne, MD;  Location: [facility] OR;  Service: Open Heart Surgery;  Laterality: N/A;  . cosmetic breast surgery    . JOINT REPLACEMENT    . KNEE ARTHROSCOPY Left   . TEE WITHOUT CARDIOVERSION  06/04/2015   Procedure: TRANSESOPHAGEAL ECHOCARDIOGRAM (TEE);  Surgeon: Alleen Borne, MD;  [facility];  Service: Open Heart Surgery;;  . TOTAL KNEE ARTHROPLASTY Left   . TOTAL KNEE ARTHROPLASTY Right 06/06/2013   Procedure: RIGHT TOTAL KNEE ARTHROPLASTY;  Surgeon: Loanne Drilling, MD;  Location: [facility] ORS;  Service: Orthopedics;  Laterality: Right;    Social History:  reports that she quit smoking about 13 years ago. Her smoking use included cigarettes. She has a 87.75 pack-year smoking history. She has never used smokeless tobacco. She reports that she does not drink alcohol or use drugs.  Allergies: No Known Allergies  Family History:  Family History  Problem Relation Age of Onset  . Breast cancer Mother   . Coronary artery disease Father   . Rheum arthritis Father      Current Outpatient Medications:  .  acetaminophen (TYLENOL ARTHRITIS PAIN) 650 MG CR tablet, Take 1,300 mg by mouth 2 (two) times daily. , Disp: , Rfl:  .  albuterol (PROAIR HFA) 108 (90 Base) MCG/ACT inhaler, 2 puffs every 4 hours as needed only  if your can't catch your breath (Patient taking differently: Inhale 2 puffs into  the lungs every 4 (four) hours as needed for shortness of breath (if you can't catch  your breath). ), Disp: 1 Inhaler, Rfl: 11 .  albuterol (PROVENTIL) (2.5 MG/3ML) 0.083% nebulizer solution, Take 3 mLs (2.5 mg total) by nebulization every 4 (four) hours as needed for shortness of breath., Disp: 75 mL, Rfl: 0 .  aspirin 81 MG tablet, Take 1 tablet (81 mg total) by mouth daily., Disp: 30 tablet, Rfl: 0 .  atorvastatin (LIPITOR) 40 MG tablet, Take 1 tablet (40 mg total) by mouth daily at 6 PM., Disp: 90 tablet, Rfl: 3 .  Cholecalciferol (VITAMIN D3) 2000 UNITS TABS, Take 2,000 Int'l Units by mouth daily., Disp: , Rfl:  .  citalopram (CELEXA) 40 MG tablet, Take 1 tablet (40 mg total) by mouth every morning., Disp: 90 tablet, Rfl: 1 .  furosemide (LASIX) 20 MG tablet, TAKE 1 TABLET BY MOUTH TWO  TIMES DAILY (Patient taking differently: Take 20 mg by mouth 2 (two) times daily. ), Disp: 180 tablet, Rfl: 2 .  golimumab (SIMPONI ARIA) 50 MG/4ML SOLN injection, Inject 50 mg into the vein every 8 (eight) weeks. , Disp: , Rfl:  .  levothyroxine (SYNTHROID, LEVOTHROID) 150 MCG tablet, TAKE 1 TABLET BY MOUTH  DAILY BEFORE BREAKFAST, Disp: 90 tablet, Rfl: 2 .  losartan (COZAAR) 50 MG tablet, Take 1 tablet (50 mg total) by mouth daily., Disp: 90 tablet, Rfl: 3 .  montelukast (SINGULAIR) 10 MG tablet, TAKE 1 TABLET BY MOUTH AT  BEDTIME (Patient taking differently: Take 10 mg by mouth at bedtime. ), Disp: 90 tablet, Rfl: 3 .  OXYGEN, Inhale 2 L into the lungs continuous. continuous o2 , Disp: , Rfl:  .  pantoprazole (PROTONIX) 40 MG tablet, Take 1 tablet (40 mg total) by mouth daily before breakfast., Disp: 90 tablet, Rfl: 1 .  potassium chloride SA (K-DUR,KLOR-CON) 20 MEQ tablet, Take 1 tablet (20 mEq total) by mouth 2 (two) times daily., Disp: 90 tablet, Rfl: 3 .  SYMBICORT 160-4.5 MCG/ACT inhaler, USE 2 PUFFS TWO TIMES DAILY (Patient taking differently: Inhale 2 puffs into the lungs 2 (two) times daily. ), Disp:  30.6 g, Rfl: 1 .  tiotropium (SPIRIVA HANDIHALER) 18 MCG inhalation capsule, Place 1 capsule (18 mcg total) into inhaler and inhale daily., Disp: 30 capsule, Rfl: 12 .  traMADol (ULTRAM) 50 MG tablet, TAKE 1-2 TABLETS EVERY 6 HOURS AS NEEDED FOR PAIN (Patient taking differently: Take 50 mg by mouth 2 (two) times daily. ), Disp: 90 tablet, Rfl: 0 .  triamcinolone cream (KENALOG) 0.1 %, Apply 1 application topically 2 (two) times daily as needed (for psoriasis). , Disp: , Rfl:  .  azithromycin (ZITHROMAX) 500 MG tablet, Take 1 tablet (500 mg total) by mouth daily for 7 days., Disp: 7 tablet, Rfl: 0 .  predniSONE (STERAPRED UNI-PAK 21 TAB) 10 MG (21) TBPK tablet, Take as directed, Disp: 21 tablet, Rfl: 0  Current Facility-Administered Medications:  .  cefTRIAXone (ROCEPHIN) injection 1 g, 1 g, Intramuscular, Once, [name], [name], MD  Review of Systems:  Constitutional: Denies fever, chills, diaphoresis, appetite change and fatigue.  HEENT: Denies photophobia, eye pain, redness, hearing loss, ear pain, congestion, sore throat, rhinorrhea, sneezing, mouth sores, trouble swallowing, neck pain, neck stiffness and tinnitus.   Respiratory: Denies , chest tightness,  and wheezing.  Positive for cough Cardiovascular: Denies chest pain, palpitations and leg swelling.  Gastrointestinal: Denies nausea, vomiting, abdominal pain, diarrhea, constipation, blood in stool and abdominal distention.  Genitourinary: Denies dysuria, urgency, frequency, hematuria, flank pain and difficulty urinating.  Endocrine: Denies: hot or cold intolerance,  sweats, changes in hair or nails, polyuria, polydipsia. Musculoskeletal: Denies myalgias, back pain, joint swelling, arthralgias and gait problem.  Skin: Denies pallor, rash and wound.  Neurological: Denies dizziness, seizures, syncope, weakness, light-headedness, numbness and headaches.  Hematological: Denies adenopathy. Easy bruising, personal or family bleeding  history  Psychiatric/Behavioral: Denies suicidal ideation, mood changes, confusion, nervousness, sleep disturbance and agitation    Physical Exam: Vitals:   12/29/18 1437  BP: 110/70  Pulse: (!) 106  Temp: 99.1 F (37.3 C)  TempSrc: Oral  SpO2: 93%  Weight: 204 lb 11.2 oz (92.9 kg)    Body mass index is 41.34 kg/m.   Constitutional: NAD, calm, comfortable Eyes: PERRL, lids and conjunctivae normal, wears corrective lenses ENMT: Mucous membranes are moist. Posterior pharynx clear of any exudate or lesions. Normal dentition. Tympanic membrane is pearly white, no erythema or bulging. Respiratory: Mildly increased respiratory effort, no accessory muscle use, crackles of the right lower and mid lung fields.  No wheezes  Cardiovascular: Regular rate and rhythm, positive systolic ejection murmur, no rubs / gallops. No extremity edema. 2+ pedal pulses. No carotid bruits.  Abdomen: no tenderness, no masses palpated. No hepatosplenomegaly. Bowel sounds positive.  Musculoskeletal: no clubbing / cyanosis. No joint deformity upper and lower extremities. Good ROM, no contractures. Normal muscle tone.  Psychiatric: Normal judgment and insight. Alert and oriented x 3. Normal mood.    Impression and Plan:  Community acquired pneumonia of right lower lobe of lung (HCC) COPD with acute exacerbation (HCC)   -Definitely concerned for pneumonia with mild COPD exacerbation given increased work of breathing and shortness of breath as well as findings on lung auscultation. -We will send for chest x-ray today. -Will be given IM Rocephin in clinic, we will also send home with a 7-day prescription for azithromycin as well as a 6-day prednisone taper. -She will come back to clinic in 1 week to assess her respiratory status.  She is advised to proceed to the emergency department if she worsens over the weekend with increased shortness of breath, high fevers or she has to increase her oxygen  levels.    Patient Instructions  -Hope you feel better soon!  -Start azithromycin 500 mg daily for 7 days.  -Start prednisone pack as directed for 6 days.  -Schedule follow up in 1 week.     Chaya JanEstela Hernandez Acosta, MD [facility]

## 2019-01-03 ENCOUNTER — Ambulatory Visit: Payer: Medicare Other | Admitting: Adult Health

## 2019-01-06 ENCOUNTER — Encounter: Payer: Self-pay | Admitting: Internal Medicine

## 2019-01-06 ENCOUNTER — Ambulatory Visit (INDEPENDENT_AMBULATORY_CARE_PROVIDER_SITE_OTHER): Payer: Medicare Other | Admitting: Internal Medicine

## 2019-01-06 VITALS — BP 120/80 | HR 90 | Temp 98.8°F | Wt 201.6 lb

## 2019-01-06 DIAGNOSIS — J441 Chronic obstructive pulmonary disease with (acute) exacerbation: Secondary | ICD-10-CM

## 2019-01-06 NOTE — Progress Notes (Signed)
Established Patient Office Visit     CC/Reason for Visit: Follow-up upper respiratory infection, mild COPD exacerbation  HPI: [name] is a 73 y.o. female who is coming in today for the above mentioned reasons. Past Medical History is significant for: Oxygen dependent COPD.  She was seen last week for an upper respiratory infection and mild COPD exacerbation.  Was given Rocephin in the office, sent home with a course of azithromycin and prednisone and asked to return today for follow-up given her propensity to hospitalization with COPD exacerbations.  She has been doing well.  Has not had to increase her oxygen, her O2 level today is 96% on 2 L, her voice is a little raspy but she is not short of breath above her baseline and is doing overall well.   Past Medical/Surgical History: Past Medical History:  Diagnosis Date  . Acute bronchitis 09/25/2015  . ACUTE ON CHRONIC DIASTOLIC HEART FAILURE 12/04/2010   Qualifier: Diagnosis of  By: Ladona Ridgel, MD, [facility], [place]con   . Arthritis    OA RIGHT KNEE WITH PAIN  . Barrett esophagus   . Bradycardia 06/01/2015  . CHF (congestive heart failure) (HCC)   . Chronic respiratory failure (HCC)   . Chronic respiratory failure with hypoxia and hypercapnia (HCC) 02/04/2010   Followed in Pulmonary clinic/ [facility]/ [facility]       - 02 dependent  since 07/02/10 >>  83% RA December 05, 2010       - ONO RA 08/05/12  :  Positive sat < 89 x 2:90m> repeat on 2lpm rec 08/12/2012  - 06/17/2013 reported desat with activity p Knee surgery > rec restart 2lpm with activity  - 06/27/2013   Walked 2lpm  x one lap @ 185 stopped due to sat 88% not sob , desat to 82% on RA just at th  . COPD (chronic obstructive pulmonary disease) (HCC)   . COPD III spirometry if use FEV1/VC p saba  07/18/2010   Quit smoking May 2006       - PFT's  04/12/10 FEV1  1.21 (69%) ratio 77 and no change p B2,  DLC0 56%   VC 70%         - PFTs  08/08/2013 FEV1 1.21 (60%) ratio 86 and no  change p B2 DLCO 79%  VC 72%  On symbicort 160 2bid  - PFT's  02/08/2018  FEV1 0.70 (40 % ) ratio 56 if use FEV1/VC  p 38 % improvement from saba p symb 160 prior to study with DLCO  78 % corrects to 147  % for alv volume   - 02/08/2018  . Cough variant asthma 02/26/2011   Followed in Pulmonary clinic/ [facility]/ [facility]  - PFT's  06/04/15  FEV1 1.20 (67 % ) ratio 83  p 6 % improvement from saba with DLCO  80 % corrects to 132 % for alv volume      - Clinical dx based on response to symbicort       FENO 09/16/2016  =   96 on symbicort 160 2bid > added singulair  Allergy profile 09/16/2016 >  Eos 0.5 /  IgE  78 neg RAST  -  Referred to rehab 04/29/2017 > completed  . DOE (dyspnea on exertion) 02/19/2016  . Essential hypertension 04/20/2007   Qualifier: Diagnosis of  By: Marcelyn Ditty RN, Katy Fitch   . GERD (gastroesophageal reflux disease)   . History of ARDS 2006  . History  of home oxygen therapy    AT NIGHT WHEN SLEEPING 2 L / MIN NASAL CANNULA  . Hyperlipidemia 07/12/2015  . Hypertension   . Hypothyroidism   . Morbid (severe) obesity due to excess calories (HCC) 04/22/2015   pfts with erv 14% 06/04/15  And 33% 02/08/2018   . NSTEMI (non-ST elevated myocardial infarction) (HCC) 05/31/2015  . Pneumococcal pneumonia (HCC) 2006   HOSPITALIZED AND DEVELOPED ARDS  . Psoriatic arthritis (HCC)   . Pulmonary fibrosis (HCC)   . PULMONARY FIBROSIS ILD POST INFLAMMATORY CHRONIC 07/18/2010   Followed as Primary Care Patient/ [facility]/ [facility]  -s/p ARDS 2006 with bacteremic S  Pna       - CT chest 07/03/10 Nonspecific PF mostly upper lobes       - CT chest 12/03/10 acute gg changes and effusions c/w chf - PFT's  02/08/2018  FVC 0.64 (28 %)   with DLCO  78 % corrects to 147 % for alv volume     . Rheumatic disease   . 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 SOB (shortness of breath) on exertion   . Stroke [facility])     Past Surgical History:  Procedure Laterality Date  . ABDOMINOPLASTY    . CARDIAC CATHETERIZATION N/A 06/01/2015    Procedure: Left Heart Cath and Coronary Angiography;  Surgeon: Runell Gess, MD;  Location: [facility] INVASIVE CV LAB;  Service: Cardiovascular;  Laterality: N/A;  . CARPAL TUNNEL RELEASE    . CHOLECYSTECTOMY    . CORONARY ARTERY BYPASS GRAFT N/A 06/04/2015   Procedure: CORONARY ARTERY BYPASS GRAFT times three            with left internal mammary artery and right leg saphenous vein;  Surgeon: Alleen Borne, MD;  Location: [facility] OR;  Service: Open Heart Surgery;  Laterality: N/A;  . cosmetic breast surgery    . JOINT REPLACEMENT    . KNEE ARTHROSCOPY Left   . TEE WITHOUT CARDIOVERSION  06/04/2015   Procedure: TRANSESOPHAGEAL ECHOCARDIOGRAM (TEE);  Surgeon: Alleen Borne, MD;  Location: [facility] OR;  Service: Open Heart Surgery;;  . TOTAL KNEE ARTHROPLASTY Left   . TOTAL KNEE ARTHROPLASTY Right 06/06/2013   Procedure: RIGHT TOTAL KNEE ARTHROPLASTY;  Surgeon: Loanne Drilling, MD;  Location: [facility] ORS;  Service: Orthopedics;  Laterality: Right;    Social History:  reports that she quit smoking about 13 years ago. Her smoking use included cigarettes. She has a 87.75 pack-year smoking history. She has never used smokeless tobacco. She reports that she does not drink alcohol or use drugs.  Allergies: No Known Allergies  Family History:  Family History  Problem Relation Age of Onset  . Breast cancer Mother   . Coronary artery disease Father   . Rheum arthritis Father      Current Outpatient Medications:  .  acetaminophen (TYLENOL ARTHRITIS PAIN) 650 MG CR tablet, Take 1,300 mg by mouth 2 (two) times daily. , Disp: , Rfl:  .  albuterol (PROAIR HFA) 108 (90 Base) MCG/ACT inhaler, 2 puffs every 4 hours as needed only  if your can't catch your breath (Patient taking differently: Inhale 2 puffs into the lungs every 4 (four) hours as needed for shortness of breath (if you can't catch your breath). ), Disp: 1 Inhaler, Rfl: 11 .  albuterol (PROVENTIL) (2.5 MG/3ML) 0.083% nebulizer solution, Take 3 mLs (2.5 mg total)  by nebulization every 4 (four) hours as needed for shortness of breath., Disp: 75 mL, Rfl: 0 .  aspirin 81 MG tablet, Take 1 tablet (81 mg total) by mouth daily., Disp: 30 tablet, Rfl: 0 .  atorvastatin (LIPITOR) 40 MG tablet, Take 1 tablet (40 mg total) by mouth daily at 6 PM., Disp: 90 tablet, Rfl: 3 .  Cholecalciferol (VITAMIN D3) 2000 UNITS TABS, Take 2,000 Int'l Units by mouth daily., Disp: , Rfl:  .  citalopram (CELEXA) 40 MG tablet, Take 1 tablet (40 mg total) by mouth every morning., Disp: 90 tablet, Rfl: 1 .  furosemide (LASIX) 20 MG tablet, TAKE 1 TABLET BY MOUTH TWO  TIMES DAILY (Patient taking differently: Take 20 mg by mouth 2 (two) times daily. ), Disp: 180 tablet, Rfl: 2 .  golimumab (SIMPONI ARIA) 50 MG/4ML SOLN injection, Inject 50 mg into the vein every 8 (eight) weeks. , Disp: , Rfl:  .  levothyroxine (SYNTHROID, LEVOTHROID) 150 MCG tablet, TAKE 1 TABLET BY MOUTH  DAILY BEFORE BREAKFAST, Disp: 90 tablet, Rfl: 2 .  losartan (COZAAR) 50 MG tablet, Take 1 tablet (50 mg total) by mouth daily., Disp: 90 tablet, Rfl: 3 .  montelukast (SINGULAIR) 10 MG tablet, TAKE 1 TABLET BY MOUTH AT  BEDTIME (Patient taking differently: Take 10 mg by mouth at bedtime. ), Disp: 90 tablet, Rfl: 3 .  OXYGEN, Inhale 2 L into the lungs continuous. continuous o2 , Disp: , Rfl:  .  pantoprazole (PROTONIX) 40 MG tablet, Take 1 tablet (40 mg total) by mouth daily before breakfast., Disp: 90 tablet, Rfl: 1 .  potassium chloride SA (K-DUR,KLOR-CON) 20 MEQ tablet, Take 1 tablet (20 mEq total) by mouth 2 (two) times daily., Disp: 90 tablet, Rfl: 3 .  SYMBICORT 160-4.5 MCG/ACT inhaler, USE 2 PUFFS TWO TIMES DAILY (Patient taking differently: Inhale 2 puffs into the lungs 2 (two) times daily. ), Disp: 30.6 g, Rfl: 1 .  tiotropium (SPIRIVA HANDIHALER) 18 MCG inhalation capsule, Place 1 capsule (18 mcg total) into inhaler and inhale daily., Disp: 30 capsule, Rfl: 12 .  traMADol (ULTRAM) 50 MG tablet, TAKE 1-2 TABLETS  EVERY 6 HOURS AS NEEDED FOR PAIN (Patient taking differently: Take 50 mg by mouth 2 (two) times daily. ), Disp: 90 tablet, Rfl: 0 .  triamcinolone cream (KENALOG) 0.1 %, Apply 1 application topically 2 (two) times daily as needed (for psoriasis). , Disp: , Rfl:   Review of Systems:  Constitutional: Denies fever, chills, diaphoresis, appetite change and fatigue.  HEENT: Denies photophobia, eye pain, redness, hearing loss, ear pain, congestion, sore throat, rhinorrhea, sneezing, mouth sores, trouble swallowing, neck pain, neck stiffness and tinnitus.   Respiratory: Denies SOB, DOE, cough, chest tightness,  and wheezing.   Cardiovascular: Denies chest pain, palpitations and leg swelling.  Gastrointestinal: Denies nausea, vomiting, abdominal pain, diarrhea, constipation, blood in stool and abdominal distention.  Genitourinary: Denies dysuria, urgency, frequency, hematuria, flank pain and difficulty urinating.  Endocrine: Denies: hot or cold intolerance, sweats, changes in hair or nails, polyuria, polydipsia. Musculoskeletal: Denies myalgias, back pain, joint swelling, arthralgias and gait problem.  Skin: Denies pallor, rash and wound.  Neurological: Denies dizziness, seizures, syncope, weakness, light-headedness, numbness and headaches.  Hematological: Denies adenopathy. Easy bruising, personal or family bleeding history  Psychiatric/Behavioral: Denies suicidal ideation, mood changes, confusion, nervousness, sleep disturbance and agitation    Physical Exam: Vitals:   01/06/19 0934  BP: 120/80  Pulse: 90  Temp: 98.8 F (37.1 C)  TempSrc: Oral  SpO2: 96%  Weight: 201 lb 9.6 oz (91.4 kg)    Body mass index is 40.72 kg/m.  Constitutional: NAD, calm, comfortable Eyes: PERRL, lids and conjunctivae normal ENMT: Mucous membranes are moist.  Respiratory: clear to auscultation bilaterally, no wheezing, no crackles. Normal respiratory effort. No accessory muscle use.  Cardiovascular: Regular  rate and rhythm, no murmurs / rubs / gallops. No extremity edema. 2+ pedal pulses. No carotid bruits.  Psychiatric: Normal judgment and insight. Alert and oriented x 3. Normal mood.    Impression and Plan:  COPD with acute exacerbation (HCC)/Viral URI -Improved, close to baseline. -She has completed course of prednisone and antibiotics. -Have instructed on general precautions and OTC meds that she may take if needed. -Follow-up as scheduled for chronic medical issues.       Chaya Jan, MD [facility]

## 2019-01-10 DIAGNOSIS — M8580 Other specified disorders of bone density and structure, unspecified site: Secondary | ICD-10-CM | POA: Diagnosis not present

## 2019-01-10 DIAGNOSIS — L409 Psoriasis, unspecified: Secondary | ICD-10-CM | POA: Diagnosis not present

## 2019-01-10 DIAGNOSIS — Z79899 Other long term (current) drug therapy: Secondary | ICD-10-CM | POA: Diagnosis not present

## 2019-01-10 DIAGNOSIS — Z6841 Body Mass Index (BMI) 40.0 and over, adult: Secondary | ICD-10-CM | POA: Diagnosis not present

## 2019-01-10 DIAGNOSIS — M0589 Other rheumatoid arthritis with rheumatoid factor of multiple sites: Secondary | ICD-10-CM | POA: Diagnosis not present

## 2019-01-10 DIAGNOSIS — I252 Old myocardial infarction: Secondary | ICD-10-CM | POA: Diagnosis not present

## 2019-01-10 DIAGNOSIS — M150 Primary generalized (osteo)arthritis: Secondary | ICD-10-CM | POA: Diagnosis not present

## 2019-01-13 ENCOUNTER — Ambulatory Visit: Payer: Medicare Other | Admitting: Internal Medicine

## 2019-01-17 ENCOUNTER — Ambulatory Visit: Payer: Medicare Other | Admitting: Cardiology

## 2019-01-17 ENCOUNTER — Encounter: Payer: Self-pay | Admitting: Internal Medicine

## 2019-01-18 ENCOUNTER — Other Ambulatory Visit: Payer: Self-pay | Admitting: Internal Medicine

## 2019-01-18 ENCOUNTER — Telehealth: Payer: Self-pay | Admitting: *Deleted

## 2019-01-18 DIAGNOSIS — E876 Hypokalemia: Secondary | ICD-10-CM

## 2019-01-18 MED ORDER — POTASSIUM CHLORIDE 20 MEQ/15ML (10%) PO SOLN: 20.0000 meq | Freq: Two times a day (BID) | ORAL | 2 refills | Status: DC

## 2019-01-18 NOTE — Telephone Encounter (Signed)
This pt called in yesterday and cancelled her follow-up appt with [name] on 01/17/19.  Will send this to the Covid-19 cancel pool, for follow-up with the pt on rescheduling her appt, at permitted time.

## 2019-01-18 NOTE — Telephone Encounter (Signed)
-----   Message from Laurence Ferrari sent at 01/17/2019  9:09 AM EDT ----- Pt cx because  of the virus. Per Karsten Fells she tod me to send you a staff message and let you to know pt cx today's appt , so you could reschedule at a later date.

## 2019-01-21 NOTE — Telephone Encounter (Signed)
6-12 weeks 

## 2019-01-24 ENCOUNTER — Encounter: Payer: Self-pay | Admitting: Internal Medicine

## 2019-01-26 ENCOUNTER — Other Ambulatory Visit: Payer: Self-pay | Admitting: Internal Medicine

## 2019-01-26 DIAGNOSIS — E876 Hypokalemia: Secondary | ICD-10-CM

## 2019-02-04 ENCOUNTER — Other Ambulatory Visit: Payer: Self-pay | Admitting: Internal Medicine

## 2019-02-06 ENCOUNTER — Other Ambulatory Visit: Payer: Self-pay | Admitting: Cardiology

## 2019-02-07 NOTE — Telephone Encounter (Signed)
Yes please refill this med. Thanks!

## 2019-02-08 DIAGNOSIS — M0589 Other rheumatoid arthritis with rheumatoid factor of multiple sites: Secondary | ICD-10-CM | POA: Diagnosis not present

## 2019-02-10 ENCOUNTER — Other Ambulatory Visit: Payer: Self-pay | Admitting: Cardiology

## 2019-02-14 ENCOUNTER — Ambulatory Visit: Payer: Medicare Other | Admitting: Internal Medicine

## 2019-03-10 ENCOUNTER — Emergency Department (HOSPITAL_COMMUNITY): Payer: Medicare Other

## 2019-03-10 ENCOUNTER — Telehealth: Payer: Self-pay | Admitting: Cardiology

## 2019-03-10 ENCOUNTER — Observation Stay (HOSPITAL_COMMUNITY)
Admission: EM | Admit: 2019-03-10 | Discharge: 2019-03-11 | Disposition: A | Discharge status: Home / Self Care | Payer: Medicare Other | Attending: Family Medicine | Admitting: Family Medicine

## 2019-03-10 ENCOUNTER — Other Ambulatory Visit: Payer: Self-pay

## 2019-03-10 ENCOUNTER — Observation Stay (HOSPITAL_COMMUNITY): Payer: Medicare Other

## 2019-03-10 ENCOUNTER — Telehealth: Payer: Self-pay | Admitting: Internal Medicine

## 2019-03-10 ENCOUNTER — Encounter (HOSPITAL_COMMUNITY): Payer: Self-pay | Admitting: Emergency Medicine

## 2019-03-10 DIAGNOSIS — E785 Hyperlipidemia, unspecified: Secondary | ICD-10-CM | POA: Diagnosis not present

## 2019-03-10 DIAGNOSIS — Z87891 Personal history of nicotine dependence: Secondary | ICD-10-CM | POA: Insufficient documentation

## 2019-03-10 DIAGNOSIS — R079 Chest pain, unspecified: Secondary | ICD-10-CM | POA: Diagnosis not present

## 2019-03-10 DIAGNOSIS — J9621 Acute and chronic respiratory failure with hypoxia: Secondary | ICD-10-CM | POA: Diagnosis present

## 2019-03-10 DIAGNOSIS — Z8673 Personal history of transient ischemic attack (TIA), and cerebral infarction without residual deficits: Secondary | ICD-10-CM | POA: Insufficient documentation

## 2019-03-10 DIAGNOSIS — E039 Hypothyroidism, unspecified: Secondary | ICD-10-CM | POA: Diagnosis not present

## 2019-03-10 DIAGNOSIS — J8410 Pulmonary fibrosis, unspecified: Secondary | ICD-10-CM | POA: Insufficient documentation

## 2019-03-10 DIAGNOSIS — L4050 Arthropathic psoriasis, unspecified: Secondary | ICD-10-CM | POA: Diagnosis not present

## 2019-03-10 DIAGNOSIS — Z7951 Long term (current) use of inhaled steroids: Secondary | ICD-10-CM | POA: Insufficient documentation

## 2019-03-10 DIAGNOSIS — J441 Chronic obstructive pulmonary disease with (acute) exacerbation: Secondary | ICD-10-CM | POA: Diagnosis not present

## 2019-03-10 DIAGNOSIS — F332 Major depressive disorder, recurrent severe without psychotic features: Secondary | ICD-10-CM | POA: Diagnosis not present

## 2019-03-10 DIAGNOSIS — D649 Anemia, unspecified: Secondary | ICD-10-CM | POA: Insufficient documentation

## 2019-03-10 DIAGNOSIS — K219 Gastro-esophageal reflux disease without esophagitis: Secondary | ICD-10-CM | POA: Diagnosis not present

## 2019-03-10 DIAGNOSIS — I700 Atherosclerosis of aorta: Secondary | ICD-10-CM | POA: Diagnosis not present

## 2019-03-10 DIAGNOSIS — I5033 Acute on chronic diastolic (congestive) heart failure: Secondary | ICD-10-CM | POA: Insufficient documentation

## 2019-03-10 DIAGNOSIS — R0602 Shortness of breath: Secondary | ICD-10-CM | POA: Diagnosis not present

## 2019-03-10 DIAGNOSIS — I110 Hypertensive heart disease with heart failure: Secondary | ICD-10-CM | POA: Diagnosis not present

## 2019-03-10 DIAGNOSIS — Z20828 Contact with and (suspected) exposure to other viral communicable diseases: Secondary | ICD-10-CM | POA: Diagnosis not present

## 2019-03-10 DIAGNOSIS — Z6841 Body Mass Index (BMI) 40.0 and over, adult: Secondary | ICD-10-CM | POA: Insufficient documentation

## 2019-03-10 DIAGNOSIS — R197 Diarrhea, unspecified: Secondary | ICD-10-CM | POA: Diagnosis not present

## 2019-03-10 DIAGNOSIS — Z79890 Hormone replacement therapy (postmenopausal): Secondary | ICD-10-CM | POA: Insufficient documentation

## 2019-03-10 DIAGNOSIS — Z7982 Long term (current) use of aspirin: Secondary | ICD-10-CM | POA: Diagnosis not present

## 2019-03-10 DIAGNOSIS — I252 Old myocardial infarction: Secondary | ICD-10-CM | POA: Diagnosis not present

## 2019-03-10 DIAGNOSIS — I4510 Unspecified right bundle-branch block: Secondary | ICD-10-CM | POA: Insufficient documentation

## 2019-03-10 DIAGNOSIS — Z8249 Family history of ischemic heart disease and other diseases of the circulatory system: Secondary | ICD-10-CM | POA: Insufficient documentation

## 2019-03-10 DIAGNOSIS — M4854XA Collapsed vertebra, not elsewhere classified, thoracic region, initial encounter for fracture: Secondary | ICD-10-CM | POA: Insufficient documentation

## 2019-03-10 DIAGNOSIS — Z79899 Other long term (current) drug therapy: Secondary | ICD-10-CM | POA: Insufficient documentation

## 2019-03-10 DIAGNOSIS — R0600 Dyspnea, unspecified: Secondary | ICD-10-CM | POA: Diagnosis not present

## 2019-03-10 DIAGNOSIS — Z951 Presence of aortocoronary bypass graft: Secondary | ICD-10-CM | POA: Diagnosis not present

## 2019-03-10 DIAGNOSIS — I10 Essential (primary) hypertension: Secondary | ICD-10-CM | POA: Diagnosis not present

## 2019-03-10 DIAGNOSIS — Z03818 Encounter for observation for suspected exposure to other biological agents ruled out: Secondary | ICD-10-CM | POA: Diagnosis not present

## 2019-03-10 DIAGNOSIS — Z9981 Dependence on supplemental oxygen: Secondary | ICD-10-CM | POA: Insufficient documentation

## 2019-03-10 LAB — CBC WITH DIFFERENTIAL/PLATELET
Abs Immature Granulocytes: 0 10*3/uL (ref 0.00–0.07)
Basophils Absolute: 0.1 10*3/uL (ref 0.0–0.1)
Basophils Relative: 1 %
Eosinophils Absolute: 0.5 10*3/uL (ref 0.0–0.5)
Eosinophils Relative: 7 %
HCT: 37.8 % (ref 36.0–46.0)
Hemoglobin: 11.3 g/dL — ABNORMAL LOW (ref 12.0–15.0)
Immature Granulocytes: 0 %
Lymphocytes Relative: 19 %
Lymphs Abs: 1.3 10*3/uL (ref 0.7–4.0)
MCH: 27.4 pg (ref 26.0–34.0)
MCHC: 29.9 g/dL — ABNORMAL LOW (ref 30.0–36.0)
MCV: 91.5 fL (ref 80.0–100.0)
Monocytes Absolute: 0.8 10*3/uL (ref 0.1–1.0)
Monocytes Relative: 11 %
Neutro Abs: 4.1 10*3/uL (ref 1.7–7.7)
Neutrophils Relative %: 62 %
Platelets: 164 10*3/uL (ref 150–400)
RBC: 4.13 MIL/uL (ref 3.87–5.11)
RDW: 14.6 % (ref 11.5–15.5)
WBC: 6.6 10*3/uL (ref 4.0–10.5)
nRBC: 0 % (ref 0.0–0.2)

## 2019-03-10 LAB — BASIC METABOLIC PANEL
Anion gap: 11 (ref 5–15)
BUN: 17 mg/dL (ref 8–23)
CO2: 33 mmol/L — ABNORMAL HIGH (ref 22–32)
Calcium: 9.4 mg/dL (ref 8.9–10.3)
Chloride: 100 mmol/L (ref 98–111)
Creatinine, Ser: 0.91 mg/dL (ref 0.44–1.00)
GFR calc Af Amer: >60 mL/min (ref >60)
GFR calc non Af Amer: >60 mL/min (ref >60)
Glucose, Bld: 125 mg/dL — ABNORMAL HIGH (ref 70–99)
Potassium: 4 mmol/L (ref 3.5–5.1)
Sodium: 144 mmol/L (ref 135–145)

## 2019-03-10 LAB — TROPONIN I: Troponin I: <0.03 ng/mL (ref <0.03)

## 2019-03-10 LAB — SARS CORONAVIRUS 2 BY RT PCR (HOSPITAL ORDER, PERFORMED IN ~~LOC~~ HOSPITAL LAB): SARS Coronavirus 2: NEGATIVE

## 2019-03-10 MED ORDER — TRAMADOL HCL 50 MG PO TABS
50.0000 mg | ORAL_TABLET | Freq: Two times a day (BID) | ORAL | Status: DC | PRN
  Administered 2019-03-10: 50 mg via ORAL
  Filled 2019-03-10: qty 1

## 2019-03-10 MED ORDER — PANTOPRAZOLE SODIUM 40 MG PO TBEC
40.0000 mg | DELAYED_RELEASE_TABLET | Freq: Every day | ORAL | Status: DC
  Administered 2019-03-11: 40 mg via ORAL
  Filled 2019-03-10: qty 1

## 2019-03-10 MED ORDER — MONTELUKAST SODIUM 10 MG PO TABS
10.0000 mg | ORAL_TABLET | Freq: Every day | ORAL | Status: DC
  Administered 2019-03-10: 10 mg via ORAL
  Filled 2019-03-10: qty 1

## 2019-03-10 MED ORDER — ATORVASTATIN CALCIUM 40 MG PO TABS: 40.0000 mg | ORAL_TABLET | Freq: Every day | ORAL | Status: DC

## 2019-03-10 MED ORDER — CITALOPRAM HYDROBROMIDE 20 MG PO TABS
40.0000 mg | ORAL_TABLET | Freq: Every morning | ORAL | Status: DC
  Administered 2019-03-11: 40 mg via ORAL
  Filled 2019-03-10: qty 2
  Filled 2019-03-10: qty 4

## 2019-03-10 MED ORDER — METHYLPREDNISOLONE SODIUM SUCC 125 MG IJ SOLR
60.0000 mg | Freq: Three times a day (TID) | INTRAMUSCULAR | Status: DC
  Administered 2019-03-11 (×2): 60 mg via INTRAVENOUS
  Filled 2019-03-10 (×2): qty 2

## 2019-03-10 MED ORDER — BUDESONIDE 0.5 MG/2ML IN SUSP
0.5000 mg | Freq: Two times a day (BID) | RESPIRATORY_TRACT | Status: DC
  Administered 2019-03-11: 0.5 mg via RESPIRATORY_TRACT
  Filled 2019-03-10: qty 2

## 2019-03-10 MED ORDER — LEVOTHYROXINE SODIUM 75 MCG PO TABS
150.0000 ug | ORAL_TABLET | Freq: Every day | ORAL | Status: DC
  Administered 2019-03-11: 150 ug via ORAL
  Filled 2019-03-10: qty 2

## 2019-03-10 MED ORDER — LOSARTAN POTASSIUM 50 MG PO TABS
50.0000 mg | ORAL_TABLET | Freq: Every day | ORAL | Status: DC
  Administered 2019-03-11: 50 mg via ORAL
  Filled 2019-03-10: qty 1

## 2019-03-10 MED ORDER — ENOXAPARIN SODIUM 40 MG/0.4ML ~~LOC~~ SOLN
40.0000 mg | SUBCUTANEOUS | Status: DC
  Administered 2019-03-10: 40 mg via SUBCUTANEOUS
  Filled 2019-03-10: qty 0.4

## 2019-03-10 MED ORDER — AZITHROMYCIN 250 MG PO TABS
500.0000 mg | ORAL_TABLET | Freq: Every day | ORAL | Status: AC
  Administered 2019-03-10: 500 mg via ORAL
  Filled 2019-03-10: qty 2

## 2019-03-10 MED ORDER — POTASSIUM CHLORIDE 20 MEQ/15ML (10%) PO SOLN: 20.0000 meq | Freq: Two times a day (BID) | ORAL | Status: DC

## 2019-03-10 MED ORDER — FUROSEMIDE 20 MG PO TABS
20.0000 mg | ORAL_TABLET | Freq: Two times a day (BID) | ORAL | Status: DC
  Administered 2019-03-11: 20 mg via ORAL
  Filled 2019-03-10 (×2): qty 1

## 2019-03-10 MED ORDER — ALBUTEROL SULFATE (2.5 MG/3ML) 0.083% IN NEBU
2.5000 mg | INHALATION_SOLUTION | Freq: Four times a day (QID) | RESPIRATORY_TRACT | Status: DC
  Administered 2019-03-10 – 2019-03-11 (×3): 2.5 mg via RESPIRATORY_TRACT
  Filled 2019-03-10 (×3): qty 3

## 2019-03-10 MED ORDER — ONDANSETRON HCL 4 MG/2ML IJ SOLN: 4.0000 mg | Freq: Four times a day (QID) | INTRAMUSCULAR | Status: DC | PRN

## 2019-03-10 MED ORDER — IOHEXOL 350 MG/ML SOLN
100.0000 mL | Freq: Once | INTRAVENOUS | Status: AC | PRN
  Administered 2019-03-10: 100 mL via INTRAVENOUS

## 2019-03-10 MED ORDER — ACETAMINOPHEN 650 MG RE SUPP: 650.0000 mg | Freq: Four times a day (QID) | RECTAL | Status: DC | PRN

## 2019-03-10 MED ORDER — ALBUTEROL SULFATE (2.5 MG/3ML) 0.083% IN NEBU: 2.5000 mg | INHALATION_SOLUTION | RESPIRATORY_TRACT | Status: DC | PRN

## 2019-03-10 MED ORDER — SODIUM CHLORIDE 0.9% FLUSH
3.0000 mL | Freq: Two times a day (BID) | INTRAVENOUS | Status: DC
  Administered 2019-03-10: 3 mL via INTRAVENOUS
  Administered 2019-03-11: 09:00:00 via INTRAVENOUS

## 2019-03-10 MED ORDER — ASPIRIN EC 81 MG PO TBEC
81.0000 mg | DELAYED_RELEASE_TABLET | Freq: Every day | ORAL | Status: DC
  Administered 2019-03-11: 81 mg via ORAL
  Filled 2019-03-10: qty 1

## 2019-03-10 MED ORDER — ACETAMINOPHEN 325 MG PO TABS
650.0000 mg | ORAL_TABLET | Freq: Four times a day (QID) | ORAL | Status: DC | PRN
  Administered 2019-03-10 – 2019-03-11 (×2): 650 mg via ORAL
  Filled 2019-03-10 (×2): qty 2

## 2019-03-10 MED ORDER — UMECLIDINIUM BROMIDE 62.5 MCG/INH IN AEPB
1.0000 | INHALATION_SPRAY | Freq: Every day | RESPIRATORY_TRACT | Status: DC
  Administered 2019-03-11: 1 via RESPIRATORY_TRACT
  Filled 2019-03-10: qty 7

## 2019-03-10 MED ORDER — AZITHROMYCIN 250 MG PO TABS: 250.0000 mg | ORAL_TABLET | Freq: Every day | ORAL | Status: DC

## 2019-03-10 MED ORDER — ALBUTEROL SULFATE HFA 108 (90 BASE) MCG/ACT IN AERS
6.0000 | INHALATION_SPRAY | Freq: Once | RESPIRATORY_TRACT | Status: AC
  Administered 2019-03-10: 6 via RESPIRATORY_TRACT
  Filled 2019-03-10: qty 6.7

## 2019-03-10 MED ORDER — GUAIFENESIN ER 600 MG PO TB12
600.0000 mg | ORAL_TABLET | Freq: Two times a day (BID) | ORAL | Status: DC
  Administered 2019-03-10 – 2019-03-11 (×2): 600 mg via ORAL
  Filled 2019-03-10 (×2): qty 1

## 2019-03-10 MED ORDER — METHYLPREDNISOLONE SODIUM SUCC 125 MG IJ SOLR
125.0000 mg | Freq: Once | INTRAMUSCULAR | Status: AC
  Administered 2019-03-10: 125 mg via INTRAVENOUS
  Filled 2019-03-10: qty 2

## 2019-03-10 MED ORDER — ONDANSETRON HCL 4 MG PO TABS: 4.0000 mg | ORAL_TABLET | Freq: Four times a day (QID) | ORAL | Status: DC | PRN

## 2019-03-10 MED ORDER — TIOTROPIUM BROMIDE MONOHYDRATE 18 MCG IN CAPS: 18.0000 ug | ORAL_CAPSULE | Freq: Every day | RESPIRATORY_TRACT | Status: DC

## 2019-03-10 MED ORDER — ARFORMOTEROL TARTRATE 15 MCG/2ML IN NEBU
15.0000 ug | INHALATION_SOLUTION | Freq: Two times a day (BID) | RESPIRATORY_TRACT | Status: DC
  Administered 2019-03-11: 15 ug via RESPIRATORY_TRACT
  Filled 2019-03-10: qty 2

## 2019-03-10 NOTE — ED Provider Notes (Signed)
[facility] EMERGENCY DEPARTMENT Provider Note   CSN: [id number] Arrival date & time: 03/10/19  1114    History   Chief Complaint Chief Complaint  Patient presents with   Shortness of Breath    HPI [name] is a 73 y.o. female with history of CHF, COPD, hypertension, hyperlipidemia, status post CABG x3, NSTEMI, CVA presents for evaluation of acute onset, progressively worsening shortness of breath for 5 days.  She reports that symptoms began Sunday evening after her family came to visit her for her birthday.  She reports that she primarily feels tired with progressively worsening shortness of breath with exertion.  He does report that she has dyspnea on exertion at baseline and is on 2 L supplemental oxygen at home via nasal cannula at all times.  She reports that "things came to ahead "last night when she felt visibly short of breath while in bed.  She had improvement with an albuterol inhaler but when she awoke again this morning with increased work of breathing and fatigue.  She called her cardiologist who recommended getting a chest x-ray in the ED or through her primary care physician.  She then contacted her primary care physician who recommended presentation to the ED.  She reports that this is how she usually feels when she has pneumonia.  She reports chronic cough which is unchanged from baseline.  Denies fevers, chest pain, abdominal pain, or leg swelling.  She does note some nausea but no vomiting.  Denies any known sick contacts or recent travel.  She is a former smoker.     The history is provided by the patient.    Past Medical History:  Diagnosis Date   Acute bronchitis 09/25/2015   ACUTE ON CHRONIC DIASTOLIC HEART FAILURE 12/04/2010   Qualifier: Diagnosis of  By: Ladona Ridgel, MD, Jerrell Mylar    Arthritis    OA RIGHT KNEE WITH PAIN   Barrett esophagus    Bradycardia 06/01/2015   CHF (congestive heart failure) (HCC)    Chronic  respiratory failure (HCC)    Chronic respiratory failure with hypoxia and hypercapnia (HCC) 02/04/2010   Followed in Pulmonary clinic/  [facility]/ [facility]       - 02 dependent  since 07/02/10 >>  83% RA December 05, 2010       - ONO RA 08/05/12  :  Positive sat < 89 x 2:73m> repeat on 2lpm rec 08/12/2012  - 06/17/2013 reported desat with activity p Knee surgery > rec restart 2lpm with activity  - 06/27/2013   Walked 2lpm  x one lap @ 185 stopped due to sat 88% not sob , desat to 82% on RA just at th   COPD (chronic obstructive pulmonary disease) (HCC)    COPD III spirometry if use FEV1/VC p saba  07/18/2010   Quit smoking May 2006       - PFT's  04/12/10 FEV1  1.21 (69%) ratio 77 and no change p B2,  DLC0 56%   VC 70%         - PFTs  08/08/2013 FEV1 1.21 (60%) ratio 86 and no change p B2 DLCO 79%  VC 72%  On symbicort 160 2bid  - PFT's  02/08/2018  FEV1 0.70 (40 % ) ratio 56 if use FEV1/VC  p 38 % improvement from saba p symb 160 prior to study with DLCO  78 % corrects to 147  % for alv volume   - 02/08/2018   Cough  variant asthma 02/26/2011   Followed in Pulmonary clinic/ [facility]/ [facility]  - PFT's  06/04/15  FEV1 1.20 (67 % ) ratio 83  p 6 % improvement from saba with DLCO  80 % corrects to 132 % for alv volume      - Clinical dx based on response to symbicort       FENO 09/16/2016  =   96 on symbicort 160 2bid > added singulair  Allergy profile 09/16/2016 >  Eos 0.5 /  IgE  78 neg RAST  -  Referred to rehab 04/29/2017 > completed   DOE (dyspnea on exertion) 02/19/2016   Essential hypertension 04/20/2007   Qualifier: Diagnosis of  By: Marcelyn Ditty, RN, Katy Fitch    GERD (gastroesophageal reflux disease)    History of ARDS 2006   History of home oxygen therapy    AT NIGHT WHEN SLEEPING 2 L / MIN NASAL CANNULA   Hyperlipidemia 07/12/2015   Hypertension    Hypothyroidism    Morbid (severe) obesity due to excess calories (HCC) 04/22/2015   pfts with erv 14% 06/04/15  And 33% 02/08/2018    NSTEMI (non-ST  elevated myocardial infarction) (HCC) 05/31/2015   Pneumococcal pneumonia (HCC) 2006   HOSPITALIZED AND DEVELOPED ARDS   Psoriatic arthritis (HCC)    Pulmonary fibrosis (HCC)    PULMONARY FIBROSIS ILD POST INFLAMMATORY CHRONIC 07/18/2010   Followed as Primary Care Patient/ [facility]/ [facility]  -s/p ARDS 2006 with bacteremic S  Pna       - CT chest 07/03/10 Nonspecific PF mostly upper lobes       - CT chest 12/03/10 acute gg changes and effusions c/w chf - PFT's  02/08/2018  FVC 0.64 (28 %)   with DLCO  78 % corrects to 147 % for alv volume      Rheumatic disease    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   SOB (shortness of breath) on exertion    Stroke [facility])     Patient Active Problem List   Diagnosis Date Noted   COPD exacerbation (HCC) 03/10/2019   Severe episode of recurrent major depressive disorder, without psychotic features (HCC) 11/09/2018   COPD (chronic obstructive pulmonary disease) (HCC) 11/02/2018   COPD with acute exacerbation (HCC) 11/01/2018   CAP (community acquired pneumonia) 10/28/2018   ICH (intracerebral hemorrhage) (HCC) 08/25/2018   Hyperlipidemia 07/12/2015   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   Bradycardia 06/01/2015   NSTEMI (non-ST elevated myocardial infarction) (HCC) 05/31/2015   Morbid (severe) obesity due to excess calories (HCC) 04/22/2015   Postoperative anemia due to acute blood loss 06/08/2013   History of home oxygen therapy 06/07/2013   GERD (gastroesophageal reflux disease) 06/07/2013   Barrett's esophagus 06/07/2013   OA (osteoarthritis) of knee 06/06/2013   ACUTE ON CHRONIC DIASTOLIC HEART FAILURE 12/04/2010   COPD III spirometry if use FEV1/VC p saba  07/18/2010   PULMONARY FIBROSIS ILD POST INFLAMMATORY CHRONIC 07/18/2010   Compression fracture of thoracic vertebra (HCC) 07/02/2010   Chronic respiratory failure with hypoxia and hypercapnia (HCC) 02/04/2010   Hypothyroidism 04/20/2007   Essential hypertension 04/20/2007   PSORIATIC  ARTHRITIS 04/20/2007    Past Surgical History:  Procedure Laterality Date   ABDOMINOPLASTY     CARDIAC CATHETERIZATION N/A 06/01/2015   Procedure: Left Heart Cath and Coronary Angiography;  Surgeon: Runell Gess, MD;  Location: [facility] INVASIVE CV LAB;  Service: Cardiovascular;  Laterality: N/A;   CARPAL TUNNEL RELEASE     CHOLECYSTECTOMY  CORONARY ARTERY BYPASS GRAFT N/A 06/04/2015   Procedure: CORONARY ARTERY BYPASS GRAFT times three            with left internal mammary artery and right leg saphenous vein;  Surgeon: Alleen Borne, MD;  Location: [facility] OR;  Service: Open Heart Surgery;  Laterality: N/A;   cosmetic breast surgery     JOINT REPLACEMENT     KNEE ARTHROSCOPY Left    TEE WITHOUT CARDIOVERSION  06/04/2015   Procedure: TRANSESOPHAGEAL ECHOCARDIOGRAM (TEE);  Surgeon: Alleen Borne, MD;  Location: [facility] OR;  Service: Open Heart Surgery;;   TOTAL KNEE ARTHROPLASTY Left    TOTAL KNEE ARTHROPLASTY Right 06/06/2013   Procedure: RIGHT TOTAL KNEE ARTHROPLASTY;  Surgeon: Loanne Drilling, MD;  Location: [facility] ORS;  Service: Orthopedics;  Laterality: Right;     OB History   No obstetric history on file.      Home Medications    Prior to Admission medications   Medication Sig Start Date End Date Taking? Authorizing Provider  acetaminophen (TYLENOL ARTHRITIS PAIN) 650 MG CR tablet Take 1,300 mg by mouth 2 (two) times daily.     [provider]  albuterol (PROAIR HFA) 108 (90 Base) MCG/ACT inhaler 2 puffs every 4 hours as needed only  if your can't catch your breath Patient taking differently: Inhale 2 puffs into the lungs every 4 (four) hours as needed for shortness of breath (if you can't catch your breath).  07/22/16   Nyoka Cowden, MD  albuterol (PROVENTIL) (2.5 MG/3ML) 0.083% nebulizer solution Take 3 mLs (2.5 mg total) by nebulization every 4 (four) hours as needed for shortness of breath. 11/04/18   Amin, Loura Halt, MD  aspirin 81 MG tablet Take 1 tablet (81 mg  total) by mouth daily. 11/08/18   George Hugh, NP  atorvastatin (LIPITOR) 40 MG tablet Take 1 tablet (40 mg total) by mouth daily at 6 PM. 10/15/18   Lars Masson, MD  Cholecalciferol (VITAMIN D3) 2000 UNITS TABS Take 2,000 Int'l Units by mouth daily.    [provider]  citalopram (CELEXA) 40 MG tablet Take 1 tablet (40 mg total) by mouth every morning. 11/30/18   [name], [name], MD  furosemide (LASIX) 20 MG tablet TAKE 1 TABLET BY MOUTH TWO  TIMES DAILY 02/11/19   Lars Masson, MD  golimumab (SIMPONI ARIA) 50 MG/4ML SOLN injection Inject 50 mg into the vein every 8 (eight) weeks.     [provider]  levothyroxine (SYNTHROID, LEVOTHROID) 150 MCG tablet TAKE 1 TABLET BY MOUTH  DAILY BEFORE BREAKFAST 11/22/18   Lars Masson, MD  losartan (COZAAR) 50 MG tablet Take 1 tablet (50 mg total) by mouth daily. 10/15/18   Lars Masson, MD  montelukast (SINGULAIR) 10 MG tablet TAKE 1 TABLET BY MOUTH AT  BEDTIME Patient taking differently: Take 10 mg by mouth at bedtime.  09/14/18   Nyoka Cowden, MD  OXYGEN Inhale 2 L into the lungs continuous. continuous o2     [provider]  pantoprazole (PROTONIX) 40 MG tablet TAKE 1 TABLET BY MOUTH  DAILY BEFORE BREAKFAST 02/07/19   Nyoka Cowden, MD  potassium chloride 20 MEQ/15ML (10%) SOLN Take 15 mLs (20 mEq total) by mouth 2 (two) times daily. 01/18/19   [name], [name], MD  potassium chloride SA (K-DUR,KLOR-CON) 20 MEQ tablet Take 1 tablet (20 mEq total) by mouth 2 (two) times daily. 11/26/18   [name], [name], MD  SYMBICORT 160-4.5 MCG/ACT inhaler USE 2 PUFFS BY MOUTH TWO  TIMES DAILY 02/07/19   Lars Masson, MD  tiotropium (SPIRIVA HANDIHALER) 18 MCG inhalation capsule Place 1 capsule (18 mcg total) into inhaler and inhale daily. 11/25/18   [name], [name], MD  traMADol (ULTRAM) 50 MG tablet TAKE 1-2 TABLETS EVERY 6 HOURS AS NEEDED FOR PAIN Patient taking  differently: Take 50 mg by mouth 2 (two) times daily.  07/15/17   Gordy Savers, MD  triamcinolone cream (KENALOG) 0.1 % Apply 1 application topically 2 (two) times daily as needed (for psoriasis).     [provider]    Family History Family History  Problem Relation Age of Onset   Breast cancer Mother    Coronary artery disease Father    Rheum arthritis Father     Social History Social History   Tobacco Use   Smoking status: Former Smoker    Packs/day: 1.50    Years: 58.50    Pack years: 87.75    Types: Cigarettes    Last attempt to quit: 03/03/2005    Years since quitting: 14.0   Smokeless tobacco: Never Used  Substance Use Topics   Alcohol use: No   Drug use: No     Allergies   Patient has no known allergies.   Review of Systems Review of Systems  Constitutional: Positive for fatigue. Negative for chills and fever.  Respiratory: Positive for cough (chronic, unchaged) and shortness of breath.   Cardiovascular: Negative for chest pain and leg swelling.  Gastrointestinal: Positive for nausea. Negative for abdominal pain and vomiting.  All other systems reviewed and are negative.    Physical Exam Updated Vital Signs BP 103/69 (BP Location: Right Arm)    Pulse (!) 103    Temp 99.5 F (37.5 C) (Oral)    Resp 19    Ht  (1.499 m)    Wt 90.4 kg    SpO2 94%    BMI 40.27 kg/m   Physical Exam Vitals signs and nursing note reviewed.  Constitutional:      General: She is not in acute distress.    Appearance: She is well-developed.  HENT:     Head: Normocephalic and atraumatic.  Eyes:     General:        Right eye: No discharge.        Left eye: No discharge.     Conjunctiva/sclera: Conjunctivae normal.  Neck:     Vascular: No JVD.     Trachea: No tracheal deviation.  Cardiovascular:     Rate and Rhythm: Normal rate and regular rhythm.     Pulses: Normal pulses.  Pulmonary:     Effort: Tachypnea present.     Breath sounds:  Examination of the right-lower field reveals decreased breath sounds. Examination of the left-lower field reveals decreased breath sounds. Decreased breath sounds and wheezing present.     Comments: SPo2 96% on 2L [place]  Chest:     Chest wall: No tenderness.  Abdominal:     General: Bowel sounds are normal. There is no distension.     Palpations: Abdomen is soft.     Tenderness: There is no abdominal tenderness. There is no guarding or rebound.  Musculoskeletal:     Right lower leg: She exhibits no tenderness. No edema.     Left lower leg: She exhibits no tenderness. No edema.  Skin:    General: Skin is warm and dry.  Findings: No erythema.  Neurological:     Mental Status: She is alert.  Psychiatric:        Behavior: Behavior normal.      ED Treatments / Results  Labs (all labs ordered are listed, but only abnormal results are displayed) Labs Reviewed  BASIC METABOLIC PANEL - Abnormal; Notable for the following components:      Result Value   CO2 33 (*)    Glucose, Bld 125 (*)    All other components within normal limits  CBC WITH DIFFERENTIAL/PLATELET - Abnormal; Notable for the following components:   Hemoglobin 11.3 (*)    MCHC 29.9 (*)    All other components within normal limits  SARS CORONAVIRUS 2 (HOSPITAL ORDER, PERFORMED IN [facility] LAB)  TROPONIN I    EKG EKG Interpretation  Date/Time:  Thursday Mar 10 2019 14:00:34 EDT Ventricular Rate:  113 PR Interval:    QRS Duration: 106 QT Interval:  365 QTC Calculation: 501 R Axis:   104 Text Interpretation:  Sinus tachycardia RSR' in V1 or V2, probably normal variant Inferior infarct, old Prolonged QT interval Baseline wander in lead(s) V5 Since prior ECG, rate has increased, no other changes Confirmed by Alvira Monday ([id number]) on 03/10/2019 3:09:43 PM   Radiology Ct Angio Chest Pe W And/or Wo Contrast  Result Date: 03/10/2019 CLINICAL DATA:  Acute presentation with shortness of breath and  difficulty breathing. Symptoms began yesterday. EXAM: CT ANGIOGRAPHY CHEST WITH CONTRAST TECHNIQUE: Multidetector CT imaging of the chest was performed using the standard protocol during bolus administration of intravenous contrast. Multiplanar CT image reconstructions and MIPs were obtained to evaluate the vascular anatomy. CONTRAST:  OMNIPAQUE IOHEXOL 350 MG/ML SOLN COMPARISON:  Chest radiography same day.  Previous CT a 10/28/2018. FINDINGS: Cardiovascular: Pulmonary arterial opacification is good. There are no pulmonary emboli. There is aortic atherosclerosis but no aneurysm or dissection. Previous median sternotomy and CABG. Heart size within normal limits. No pericardial fluid. Mediastinum/Nodes: No mass or lymphadenopathy. Normal mediastinal nodes. Lungs/Pleura: Chronic upper lobe scarring with mild emphysematous changes. Few scattered subpleural nodules and pulmonary scarring with near complete resolution of the widespread peripheral infiltrates seen in December. No worsening or new finding. No pleural effusion. Upper Abdomen: Normal Musculoskeletal: Ordinary thoracic degenerative changes. Partial compression fracture of T4 is old. Review of the MIP images confirms the above findings. IMPRESSION: No pulmonary emboli. Nearly completely resolved pulmonary infiltrates seen in December, perhaps with mild residual scarring. Aortic atherosclerosis. Electronically Signed   By: Paulina Fusi M.D.   On: 03/10/2019 15:33   Dg Chest Portable 1 View  Result Date: 03/10/2019 CLINICAL DATA:  73 year old female with chest pain EXAM: PORTABLE CHEST 1 VIEW COMPARISON:  12/29/2010 FINDINGS: Cardiomediastinal silhouette unchanged in size and contour. Surgical changes of median sternotomy and CABG. Low lung volumes. No pneumothorax or pleural effusion. No confluent airspace disease. Coarsened interstitial markings are similar to prior. IMPRESSION: Low lung volumes and chronic changes without evidence of acute  cardiopulmonary disease. Surgical changes of median sternotomy and CABG Electronically Signed   By: Gilmer Mor D.O.   On: 03/10/2019 12:16    Procedures .Critical Care Performed by: Jeanie Sewer, PA-C Authorized by: Jeanie Sewer, PA-C   Critical care provider statement:    Critical care time (minutes):  35   Critical care was necessary to treat or prevent imminent or life-threatening deterioration of the following conditions:  Respiratory failure   Critical care was time spent personally by me on  the following activities:  Discussions with consultants, evaluation of patient's response to treatment, examination of patient, ordering and performing treatments and interventions, ordering and review of laboratory studies, ordering and review of radiographic studies, pulse oximetry, re-evaluation of patient's condition, obtaining history from patient or surrogate and review of old charts   (including critical care time)  Medications Ordered in ED Medications  methylPREDNISolone sodium succinate (SOLU-MEDROL) 125 mg/2 mL injection 125 mg (125 mg Intravenous Given 03/10/19 1211)  albuterol (VENTOLIN HFA) 108 (90 Base) MCG/ACT inhaler 6 puff (6 puffs Inhalation Given 03/10/19 1211)  iohexol (OMNIPAQUE) 350 MG/ML injection 100 mL (100 mLs Intravenous Contrast Given 03/10/19 1515)     Initial Impression / Assessment and Plan / ED Course  I have reviewed the triage vital signs and the nursing notes.  Pertinent labs & imaging results that were available during my care of the patient were reviewed by me and considered in my medical decision making (see chart for details).        Patient presenting for evaluation of progressively worsening shortness of breath.  She is afebrile, mildly tachycardic on initial presentation.  SPO2 saturations stable at rest on her usual 2 L.  She was ambulated on pulse oximetry with desaturation to 76% on her home O2 and was visibly short of breath.  She had some  improvement with albuterol and Solu-Medrol.  Lab work reviewed by me shows no leukocytosis, mild anemia, no metabolic derangements.  No renal insufficiency.  She is negative for COVID.  She denies any chest pain and her troponin is negative so I doubt ACS/MI.  Her EKG shows faster rate but otherwise no acute ischemic changes.  Due to her severe hypoxia on ambulation a PE study was obtained which was negative for PE, dissection, or pneumonia.  She does have improvement in pulmonary infiltrates seen previously.  Suspect COPD exacerbation.  May require adjustment of her home oxygen requirement.  Spoke with Dr. Katrinka BlazingSmith with [facility] hospitalist service who agrees to assume care of patient and bring her into the hospital for further evaluation and management.  [name] was evaluated in Emergency Department on 03/10/2019 for the symptoms described in the history of present illness. She was evaluated in the context of the global COVID-19 pandemic, which necessitated consideration that the patient might be at risk for infection with the SARS-CoV-2 virus that causes COVID-19. Institutional protocols and algorithms that pertain to the evaluation of patients at risk for COVID-19 are in a state of rapid change based on information released by regulatory bodies including the CDC and federal and state organizations. These policies and algorithms were followed during the patient's care in the ED.  Final Clinical Impressions(s) / ED Diagnoses   Final diagnoses:  COPD exacerbation [facility](HCC)    ED Discharge Orders    None       Jeanie SewerFawze, Dennisha Mouser A, PA-C 03/10/19 1624    Alvira MondaySchlossman, Erin, MD 03/11/19 [phone]

## 2019-03-10 NOTE — Telephone Encounter (Signed)
Spoke with patient and she will go to the ER per Dr Ardyth Harps.

## 2019-03-10 NOTE — ED Notes (Signed)
ED TO INPATIENT HANDOFF REPORT  ED Nurse Name and Phone #: [phone]  S Name/Age/Gender [name] 73 y.o. female Room/Bed: 016C/016C  Code Status   Code Status: Full Code  Home/SNF/Other Home Patient oriented to: self, place, time and situation Is this baseline? Yes   Triage Complete: Triage complete  Chief Complaint sob/hx of copd  Triage Note Pt arrives to ED from home with complaints of shortness of breath and labored breathing starting yesterday and throughout the whole day. Pt stated she got so SOB last night that she used her rescue inhaler twice. Pt denies any pain.    Allergies No Known Allergies  Level of Care/Admitting Diagnosis ED Disposition    ED Disposition Condition Comment   Admit  Hospital Area: [facility] [[id number]]  Level of Care: Med-Surg [16]  I expect the patient will be discharged within 24 hours: Yes  LOW acuity---Tx typically complete <24 hrs---ACUTE conditions typically can be evaluated <24 hours---LABS likely to return to acceptable levels <24 hours---IS near functional baseline---EXPECTED to return to current living arrangement---NOT newly hypoxic: Meets criteria for 5C-Observation unit  Covid Evaluation: N/A  Diagnosis: COPD exacerbation [facility]) [[id number]]  Admitting Physician: Clydie Braun [[id number]]  Attending Physician: Clydie Braun [[id number]]  PT Class (Do Not Modify): Observation [104]  PT Acc Code (Do Not Modify): Observation [10022]       B Medical/Surgery History Past Medical History:  Diagnosis Date  . Acute bronchitis 09/25/2015  . ACUTE ON CHRONIC DIASTOLIC HEART FAILURE 12/04/2010   Qualifier: Diagnosis of  By: Ladona Ridgel, MD, [facility], [place]   . Arthritis    OA RIGHT KNEE WITH PAIN  . Barrett esophagus   . Bradycardia 06/01/2015  . CHF (congestive heart failure) (HCC)   . Chronic respiratory failure (HCC)   . Chronic respiratory failure with hypoxia and hypercapnia (HCC) 02/04/2010   Followed  in Pulmonary clinic/ [facility]/ [facility]       - 02 dependent  since 07/02/10 >>  83% RA December 05, 2010       - ONO RA 08/05/12  :  Positive sat < 89 x 2:45m> repeat on 2lpm rec 08/12/2012  - 06/17/2013 reported desat with activity p Knee surgery > rec restart 2lpm with activity  - 06/27/2013   Walked 2lpm  x one lap @ 185 stopped due to sat 88% not sob , desat to 82% on RA just at th  . COPD (chronic obstructive pulmonary disease) (HCC)   . COPD III spirometry if use FEV1/VC p saba  07/18/2010   Quit smoking May 2006       - PFT's  04/12/10 FEV1  1.21 (69%) ratio 77 and no change p B2,  DLC0 56%   VC 70%         - PFTs  08/08/2013 FEV1 1.21 (60%) ratio 86 and no change p B2 DLCO 79%  VC 72%  On symbicort 160 2bid  - PFT's  02/08/2018  FEV1 0.70 (40 % ) ratio 56 if use FEV1/VC  p 38 % improvement from saba p symb 160 prior to study with DLCO  78 % corrects to 147  % for alv volume   - 02/08/2018  . Cough variant asthma 02/26/2011   Followed in Pulmonary clinic/ [facility]/ [facility]  - PFT's  06/04/15  FEV1 1.20 (67 % ) ratio 83  p 6 % improvement from saba with DLCO  80 % corrects to 132 % for alv volume      -  Clinical dx based on response to symbicort       FENO 09/16/2016  =   96 on symbicort 160 2bid > added singulair  Allergy profile 09/16/2016 >  Eos 0.5 /  IgE  78 neg RAST  -  Referred to rehab 04/29/2017 > completed  . DOE (dyspnea on exertion) 02/19/2016  . Essential hypertension 04/20/2007   Qualifier: Diagnosis of  By: Marcelyn Ditty RN, Katy Fitch   . GERD (gastroesophageal reflux disease)   . History of ARDS 2006  . History of home oxygen therapy    AT NIGHT WHEN SLEEPING 2 L / MIN NASAL CANNULA  . Hyperlipidemia 07/12/2015  . Hypertension   . Hypothyroidism   . Morbid (severe) obesity due to excess calories (HCC) 04/22/2015   pfts with erv 14% 06/04/15  And 33% 02/08/2018   . NSTEMI (non-ST elevated myocardial infarction) (HCC) 05/31/2015  . Pneumococcal pneumonia (HCC) 2006   HOSPITALIZED AND DEVELOPED  ARDS  . Psoriatic arthritis (HCC)   . Pulmonary fibrosis (HCC)   . PULMONARY FIBROSIS ILD POST INFLAMMATORY CHRONIC 07/18/2010   Followed as Primary Care Patient/ [facility]/ [facility]  -s/p ARDS 2006 with bacteremic S  Pna       - CT chest 07/03/10 Nonspecific PF mostly upper lobes       - CT chest 12/03/10 acute gg changes and effusions c/w chf - PFT's  02/08/2018  FVC 0.64 (28 %)   with DLCO  78 % corrects to 147 % for alv volume     . Rheumatic disease   . 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 SOB (shortness of breath) on exertion   . Stroke [facility])    Past Surgical History:  Procedure Laterality Date  . ABDOMINOPLASTY    . CARDIAC CATHETERIZATION N/A 06/01/2015   Procedure: Left Heart Cath and Coronary Angiography;  Surgeon: Runell Gess, MD;  Location: [facility] INVASIVE CV LAB;  Service: Cardiovascular;  Laterality: N/A;  . CARPAL TUNNEL RELEASE    . CHOLECYSTECTOMY    . CORONARY ARTERY BYPASS GRAFT N/A 06/04/2015   Procedure: CORONARY ARTERY BYPASS GRAFT times three            with left internal mammary artery and right leg saphenous vein;  Surgeon: Alleen Borne, MD;  Location: [facility] OR;  Service: Open Heart Surgery;  Laterality: N/A;  . cosmetic breast surgery    . JOINT REPLACEMENT    . KNEE ARTHROSCOPY Left   . TEE WITHOUT CARDIOVERSION  06/04/2015   Procedure: TRANSESOPHAGEAL ECHOCARDIOGRAM (TEE);  Surgeon: Alleen Borne, MD;  Location: [facility] OR;  Service: Open Heart Surgery;;  . TOTAL KNEE ARTHROPLASTY Left   . TOTAL KNEE ARTHROPLASTY Right 06/06/2013   Procedure: RIGHT TOTAL KNEE ARTHROPLASTY;  Surgeon: Loanne Drilling, MD;  Location: [facility] ORS;  Service: Orthopedics;  Laterality: Right;     A IV Location/Drains/Wounds Patient Lines/Drains/Airways Status   Active Line/Drains/Airways    Name:   Placement date:   Placement time:   Site:   Days:   Peripheral IV 03/10/19 Left Antecubital   03/10/19    1200    Antecubital   less than 1   Incision 06/06/13 Leg Right   06/06/13    0802     2103   Incision  (Closed) 06/04/15 Chest Other (Comment)   06/04/15    1506     1375   Incision (Closed) 06/04/15 Leg Right   06/04/15    1506  1375          Intake/Output Last 24 hours No intake or output data in the 24 hours ending 03/10/19 1636  Labs/Imaging Results for orders placed or performed during the hospital encounter of 03/10/19 (from the past 48 hour(s))  Basic metabolic panel     Status: Abnormal   Collection Time: 03/10/19 11:47 AM  Result Value Ref Range   Sodium 144 135 - 145 mmol/L   Potassium 4.0 3.5 - 5.1 mmol/L   Chloride 100 98 - 111 mmol/L   CO2 33 (H) 22 - 32 mmol/L   Glucose, Bld 125 (H) 70 - 99 mg/dL   BUN 17 8 - 23 mg/dL   Creatinine, Ser 9.82 0.44 - 1.00 mg/dL   Calcium 9.4 8.9 - 64.1 mg/dL   GFR calc non Af Amer >60 >60 mL/min   GFR calc Af Amer >60 >60 mL/min   Anion gap 11 5 - 15    Comment: Performed at [facility], [address], [place], [place] [address]  CBC with Differential     Status: Abnormal   Collection Time: 03/10/19 11:47 AM  Result Value Ref Range   WBC 6.6 4.0 - 10.5 K/uL   RBC 4.13 3.87 - 5.11 MIL/uL   Hemoglobin 11.3 (L) 12.0 - 15.0 g/dL   HCT 40.7 68.0 - 88.1 %   MCV 91.5 80.0 - 100.0 fL   MCH 27.4 26.0 - 34.0 pg   MCHC 29.9 (L) 30.0 - 36.0 g/dL   RDW 10.3 15.9 - 45.8 %   Platelets 164 150 - 400 K/uL   nRBC 0.0 0.0 - 0.2 %   Neutrophils Relative % 62 %   Neutro Abs 4.1 1.7 - 7.7 K/uL   Lymphocytes Relative 19 %   Lymphs Abs 1.3 0.7 - 4.0 K/uL   Monocytes Relative 11 %   Monocytes Absolute 0.8 0.1 - 1.0 K/uL   Eosinophils Relative 7 %   Eosinophils Absolute 0.5 0.0 - 0.5 K/uL   Basophils Relative 1 %   Basophils Absolute 0.1 0.0 - 0.1 K/uL   Immature Granulocytes 0 %   Abs Immature Granulocytes 0.00 0.00 - 0.07 K/uL    Comment: Performed at [facility], [address] [place], [place] [address]  Troponin I - Once     Status: None   Collection Time: 03/10/19 11:47 AM  Result Value Ref Range   Troponin I <0.03  <0.03 ng/mL    Comment: Performed at [facility], [address], [place], [place] [address]  SARS Coronavirus 2 (CEPHEID- Performed in [facility] hospital lab), Hosp Order     Status: None   Collection Time: 03/10/19 12:00 PM  Result Value Ref Range   SARS Coronavirus 2 NEGATIVE NEGATIVE    Comment: (NOTE) If result is NEGATIVE SARS-CoV-2 target nucleic acids are NOT DETECTED. The SARS-CoV-2 RNA is generally detectable in upper and lower  respiratory specimens during the acute phase of infection. The lowest  concentration of SARS-CoV-2 viral copies this assay can detect is 250  copies / mL. A negative result does not preclude SARS-CoV-2 infection  and should not be used as the sole basis for treatment or other  patient management decisions.  A negative result may occur with  improper specimen collection / handling, submission of specimen other  than nasopharyngeal swab, presence of viral mutation(s) within the  areas targeted by this assay, and inadequate number of viral copies  (<250 copies /  mL). A negative result must be combined with clinical  observations, patient history, and epidemiological information. If result is POSITIVE SARS-CoV-2 target nucleic acids are DETECTED. The SARS-CoV-2 RNA is generally detectable in upper and lower  respiratory specimens dur ing the acute phase of infection.  Positive  results are indicative of active infection with SARS-CoV-2.  Clinical  correlation with patient history and other diagnostic information is  necessary to determine patient infection status.  Positive results do  not rule out bacterial infection or co-infection with other viruses. If result is PRESUMPTIVE POSTIVE SARS-CoV-2 nucleic acids MAY BE PRESENT.   A presumptive positive result was obtained on the submitted specimen  and confirmed on repeat testing.  While 2019 novel coronavirus  (SARS-CoV-2) nucleic acids may be present in the submitted sample  additional  confirmatory testing may be necessary for epidemiological  and / or clinical management purposes  to differentiate between  SARS-CoV-2 and other Sarbecovirus currently known to infect humans.  If clinically indicated additional testing with an alternate test  methodology [phone]) is advised. The SARS-CoV-2 RNA is generally  detectable in upper and lower respiratory sp ecimens during the acute  phase of infection. The expected result is Negative. Fact Sheet for Patients:  BoilerBrush.com.cy Fact Sheet for Healthcare Providers: https://pope.com/ This test is not yet approved or cleared by the [place] FDA and has been authorized for detection and/or diagnosis of SARS-CoV-2 by FDA under an Emergency Use Authorization (EUA).  This EUA will remain in effect (meaning this test can be used) for the duration of the COVID-19 declaration under Section 564(b)(1) of the Act, 21 U.S.C. section 360bbb-3(b)(1), unless the authorization is terminated or revoked sooner. Performed at [facility], [address] [place], [place] [address]    Ct Angio Chest Pe W And/or Wo Contrast  Result Date: 03/10/2019 CLINICAL DATA:  Acute presentation with shortness of breath and difficulty breathing. Symptoms began yesterday. EXAM: CT ANGIOGRAPHY CHEST WITH CONTRAST TECHNIQUE: Multidetector CT imaging of the chest was performed using the standard protocol during bolus administration of intravenous contrast. Multiplanar CT image reconstructions and MIPs were obtained to evaluate the vascular anatomy. CONTRAST:  OMNIPAQUE IOHEXOL 350 MG/ML SOLN COMPARISON:  Chest radiography same day.  Previous CT a 10/28/2018. FINDINGS: Cardiovascular: Pulmonary arterial opacification is good. There are no pulmonary emboli. There is aortic atherosclerosis but no aneurysm or dissection. Previous median sternotomy and CABG. Heart size within normal limits. No pericardial  fluid. Mediastinum/Nodes: No mass or lymphadenopathy. Normal mediastinal nodes. Lungs/Pleura: Chronic upper lobe scarring with mild emphysematous changes. Few scattered subpleural nodules and pulmonary scarring with near complete resolution of the widespread peripheral infiltrates seen in December. No worsening or new finding. No pleural effusion. Upper Abdomen: Normal Musculoskeletal: Ordinary thoracic degenerative changes. Partial compression fracture of T4 is old. Review of the MIP images confirms the above findings. IMPRESSION: No pulmonary emboli. Nearly completely resolved pulmonary infiltrates seen in December, perhaps with mild residual scarring. Aortic atherosclerosis. Electronically Signed   By: Paulina Fusi M.D.   On: 03/10/2019 15:33   Dg Chest Portable 1 View  Result Date: 03/10/2019 CLINICAL DATA:  73 year old female with chest pain EXAM: PORTABLE CHEST 1 VIEW COMPARISON:  12/29/2010 FINDINGS: Cardiomediastinal silhouette unchanged in size and contour. Surgical changes of median sternotomy and CABG. Low lung volumes. No pneumothorax or pleural effusion. No confluent airspace disease. Coarsened interstitial markings are similar to prior. IMPRESSION: Low lung volumes and chronic changes without evidence of acute cardiopulmonary disease. Surgical  changes of median sternotomy and CABG Electronically Signed   By: Gilmer MorJaime  Wagner D.O.   On: 03/10/2019 12:16    Pending Labs Unresulted Labs (From admission, onward)    Start     Ordered   03/11/19 0500  CBC  Tomorrow morning,   R     03/10/19 1627   03/11/19 0500  Basic metabolic panel  Tomorrow morning,   R     03/10/19 1627          Vitals/Pain Today's Vitals   03/10/19 1600 03/10/19 1628 03/10/19 1631 03/10/19 1632  BP:   140/77   Pulse: (!) 103 (!) 102 100   Resp: 19 20 20    Temp:   99.2 F (37.3 C)   TempSrc:   Oral   SpO2: 94% 96% 95%   Weight:      Height:      PainSc:    0-No pain    Isolation Precautions No active  isolations  Medications Medications  aspirin tablet 81 mg (has no administration in time range)  atorvastatin (LIPITOR) tablet 40 mg (has no administration in time range)  citalopram (CELEXA) tablet 40 mg (has no administration in time range)  levothyroxine (SYNTHROID) tablet 150 mcg (has no administration in time range)  furosemide (LASIX) tablet 20 mg (has no administration in time range)  losartan (COZAAR) tablet 50 mg (has no administration in time range)  traMADol (ULTRAM) tablet 50 mg (has no administration in time range)  montelukast (SINGULAIR) tablet 10 mg (has no administration in time range)  potassium chloride 20 MEQ/15ML (10%) solution 20 mEq (has no administration in time range)  pantoprazole (PROTONIX) EC tablet 40 mg (has no administration in time range)  tiotropium (SPIRIVA) inhalation capsule ([facility] use ONLY) 18 mcg (has no administration in time range)  arformoterol (BROVANA) nebulizer solution 15 mcg (has no administration in time range)  budesonide (PULMICORT) nebulizer solution 0.5 mg (has no administration in time range)  albuterol (PROVENTIL) (2.5 MG/3ML) 0.083% nebulizer solution 2.5 mg (has no administration in time range)  albuterol (PROVENTIL) (2.5 MG/3ML) 0.083% nebulizer solution 2.5 mg (has no administration in time range)  enoxaparin (LOVENOX) injection 40 mg (has no administration in time range)  sodium chloride flush (NS) 0.9 % injection 3 mL (has no administration in time range)  ondansetron (ZOFRAN) tablet 4 mg (has no administration in time range)    Or  ondansetron (ZOFRAN) injection 4 mg (has no administration in time range)  acetaminophen (TYLENOL) tablet 650 mg (has no administration in time range)    Or  acetaminophen (TYLENOL) suppository 650 mg (has no administration in time range)  guaiFENesin (MUCINEX) 12 hr tablet 600 mg (has no administration in time range)  azithromycin (ZITHROMAX) tablet 500 mg (has no administration in time range)     Followed by  azithromycin (ZITHROMAX) tablet 250 mg (has no administration in time range)  methylPREDNISolone sodium succinate (SOLU-MEDROL) 125 mg/2 mL injection 60 mg (has no administration in time range)  methylPREDNISolone sodium succinate (SOLU-MEDROL) 125 mg/2 mL injection 125 mg (125 mg Intravenous Given 03/10/19 1211)  albuterol (VENTOLIN HFA) 108 (90 Base) MCG/ACT inhaler 6 puff (6 puffs Inhalation Given 03/10/19 1211)  iohexol (OMNIPAQUE) 350 MG/ML injection 100 mL (100 mLs Intravenous Contrast Given 03/10/19 1515)    Mobility walks Moderate fall risk   Focused Assessments Cardiac Assessment Handoff:  Cardiac Rhythm: Sinus tachycardia Lab Results  Component Value Date   CKTOTAL 117 12/03/2010   CKMB 3.7 12/03/2010  TROPONINI <0.03 03/10/2019   No results found for: DDIMER Does the Patient currently have chest pain? No  , Pulmonary Assessment Handoff:  Lung sounds: Bilateral Breath Sounds: Expiratory wheezes L Breath Sounds: Expiratory wheezes R Breath Sounds: Expiratory wheezes O2 Device: Room Air O2 Flow Rate (L/min): 2 L/min      R Recommendations: See Admitting Provider Note  Report given to: 2W RN  Additional Notes: Pt arrives to ED from home with complaints of shortness of breath and labored breathing starting yesterday and throughout the whole day. Pt stated she got so SOB last night that she used her rescue inhaler twice. Pt denies any pain. Pt wears 2L [place] at all times at home. Improved after inhaler and steroids here.

## 2019-03-10 NOTE — Progress Notes (Signed)
Pt refused PM Lasix; only takes 20mg  @home  once a day in the morning

## 2019-03-10 NOTE — Plan of Care (Signed)

## 2019-03-10 NOTE — H&P (Signed)
History and Physical    [name] AVW:[id number] DOB: October 05, 1946 DOA: 03/10/2019  Referring MD/NP/PA: Michela Pitcher, PA-C PCP: [name], [name], MD  Patient coming from: Home  Chief Complaint: Shortness of breath  I have personally briefly reviewed patient's old medical records in [facility]   HPI: [name] is a 73 y.o. female with medical history significant of COPD, chronic respiratory failure on 2 L of nasal cannula oxygen, CHF, CAD s/p 3v CABG, psoriasis, and GERD; who presents with respiratory worsening shortness of breath for the last 3 to 4 days.  She reports chronically having a productive cough that has been unchanged, but her breathing has been more labored.  She reports having to use her rescue albuterol inhaler twice a day which is unusual for her.  Symptoms progressively worsened to the point which she had to use her albuterol inhaler overnight with some mild relief.  Denies having any changes in her weight, fever, leg swelling, chest pain, abdominal pain, or dysuria.  Also complaining of nausea and diarrhea symptoms.  Planes of having anywhere from 4-5 bowel movements a day that has been present over the last couple months.    ED Course: Upon admission into the emergency department patient was noted to be afebrile, pulse 92-1 10, respirations 16-23, blood pressure 103/69, and O2 saturations maintained on nasal cannula oxygen.  COVID screening was negative.  Troponin negative.  CT angiogram of the chest showed no signs of a pulmonary embolus, fluid, or pneumonia.  However, with ambulation O2 saturations fell to 76% on home 2 L nasal cannula oxygen patient was very winded.  Review of Systems  Constitutional: Positive for malaise/fatigue. Negative for chills and fever.  HENT: Negative for congestion and ear discharge.   Eyes: Negative for photophobia and pain.  Respiratory: Positive for cough, sputum production, shortness of breath and wheezing.     Cardiovascular: Negative for chest pain and leg swelling.  Gastrointestinal: Positive for diarrhea and nausea. Negative for abdominal pain, blood in stool and vomiting.  Genitourinary: Negative for dysuria and hematuria.  Musculoskeletal: Negative for falls.  Neurological: Negative for loss of consciousness and weakness.  Psychiatric/Behavioral: Negative for memory loss and substance abuse.    Past Medical History:  Diagnosis Date   Acute bronchitis 09/25/2015   ACUTE ON CHRONIC DIASTOLIC HEART FAILURE 12/04/2010   Qualifier: Diagnosis of  By: Ladona Ridgel, MD, Jerrell Mylar    Arthritis    OA RIGHT KNEE WITH PAIN   Barrett esophagus    Bradycardia 06/01/2015   CHF (congestive heart failure) (HCC)    Chronic respiratory failure (HCC)    Chronic respiratory failure with hypoxia and hypercapnia (HCC) 02/04/2010   Followed in Pulmonary clinic/ [facility]/ [facility]       - 02 dependent  since 07/02/10 >>  83% RA December 05, 2010       - ONO RA 08/05/12  :  Positive sat < 89 x 2:73m> repeat on 2lpm rec 08/12/2012  - 06/17/2013 reported desat with activity p Knee surgery > rec restart 2lpm with activity  - 06/27/2013   Walked 2lpm  x one lap @ 185 stopped due to sat 88% not sob , desat to 82% on RA just at th   COPD (chronic obstructive pulmonary disease) (HCC)    COPD III spirometry if use FEV1/VC p saba  07/18/2010   Quit smoking May 2006       - PFT's  04/12/10 FEV1  1.21 (  69%) ratio 77 and no change p B2,  DLC0 56%   VC 70%         - PFTs  08/08/2013 FEV1 1.21 (60%) ratio 86 and no change p B2 DLCO 79%  VC 72%  On symbicort 160 2bid  - PFT's  02/08/2018  FEV1 0.70 (40 % ) ratio 56 if use FEV1/VC  p 38 % improvement from saba p symb 160 prior to study with DLCO  78 % corrects to 147  % for alv volume   - 02/08/2018   Cough variant asthma 02/26/2011   Followed in Pulmonary clinic/ [facility]/ [facility]  - PFT's  06/04/15  FEV1 1.20 (67 % ) ratio 83  p 6 % improvement from saba with DLCO  80 %  corrects to 132 % for alv volume      - Clinical dx based on response to symbicort       FENO 09/16/2016  =   96 on symbicort 160 2bid > added singulair  Allergy profile 09/16/2016 >  Eos 0.5 /  IgE  78 neg RAST  -  Referred to rehab 04/29/2017 > completed   DOE (dyspnea on exertion) 02/19/2016   Essential hypertension 04/20/2007   Qualifier: Diagnosis of  By: Marcelyn DittyMondi, RN, Katy FitchJudy Ann    GERD (gastroesophageal reflux disease)    History of ARDS 2006   History of home oxygen therapy    AT NIGHT WHEN SLEEPING 2 L / MIN NASAL CANNULA   Hyperlipidemia 07/12/2015   Hypertension    Hypothyroidism    Morbid (severe) obesity due to excess calories (HCC) 04/22/2015   pfts with erv 14% 06/04/15  And 33% 02/08/2018    NSTEMI (non-ST elevated myocardial infarction) (HCC) 05/31/2015   Pneumococcal pneumonia (HCC) 2006   HOSPITALIZED AND DEVELOPED ARDS   Psoriatic arthritis (HCC)    Pulmonary fibrosis (HCC)    PULMONARY FIBROSIS ILD POST INFLAMMATORY CHRONIC 07/18/2010   Followed as Primary Care Patient/ [facility]  -s/p ARDS 2006 with bacteremic S  Pna       - CT chest 07/03/10 Nonspecific PF mostly upper lobes       - CT chest 12/03/10 acute gg changes and effusions c/w chf - PFT's  02/08/2018  FVC 0.64 (28 %)   with DLCO  78 % corrects to 147 % for alv volume      Rheumatic disease    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   SOB (shortness of breath) on exertion    Stroke [facility](HCC)     Past Surgical History:  Procedure Laterality Date   ABDOMINOPLASTY     CARDIAC CATHETERIZATION N/A 06/01/2015   Procedure: Left Heart Cath and Coronary Angiography;  Surgeon: Runell GessJonathan J Berry, MD;  [facility];  Service: Cardiovascular;  Laterality: N/A;   CARPAL TUNNEL RELEASE     CHOLECYSTECTOMY     CORONARY ARTERY BYPASS GRAFT N/A 06/04/2015   Procedure: CORONARY ARTERY BYPASS GRAFT times three            with left internal mammary artery and right leg saphenous vein;  Surgeon: Alleen BorneBryan K Bartle, MD;   Location: [facility] OR;  Service: Open Heart Surgery;  Laterality: N/A;   cosmetic breast surgery     JOINT REPLACEMENT     KNEE ARTHROSCOPY Left    TEE WITHOUT CARDIOVERSION  06/04/2015   Procedure: TRANSESOPHAGEAL ECHOCARDIOGRAM (TEE);  Surgeon: Alleen BorneBryan K Bartle, MD;  [facility];  Service: Open Heart Surgery;;  TOTAL KNEE ARTHROPLASTY Left    TOTAL KNEE ARTHROPLASTY Right 06/06/2013   Procedure: RIGHT TOTAL KNEE ARTHROPLASTY;  Surgeon: Loanne Drilling, MD;  Location: [facility] ORS;  Service: Orthopedics;  Laterality: Right;     reports that she quit smoking about 14 years ago. Her smoking use included cigarettes. She has a 87.75 pack-year smoking history. She has never used smokeless tobacco. She reports that she does not drink alcohol or use drugs.  No Known Allergies  Family History  Problem Relation Age of Onset   Breast cancer Mother    Coronary artery disease Father    Rheum arthritis Father     Prior to Admission medications   Medication Sig Start Date End Date Taking? Authorizing Provider  acetaminophen (TYLENOL ARTHRITIS PAIN) 650 MG CR tablet Take 1,300 mg by mouth 2 (two) times daily.     [provider]  albuterol (PROAIR HFA) 108 (90 Base) MCG/ACT inhaler 2 puffs every 4 hours as needed only  if your can't catch your breath Patient taking differently: Inhale 2 puffs into the lungs every 4 (four) hours as needed for shortness of breath (if you can't catch your breath).  07/22/16   Nyoka Cowden, MD  albuterol (PROVENTIL) (2.5 MG/3ML) 0.083% nebulizer solution Take 3 mLs (2.5 mg total) by nebulization every 4 (four) hours as needed for shortness of breath. 11/04/18   Amin, Loura Halt, MD  aspirin 81 MG tablet Take 1 tablet (81 mg total) by mouth daily. 11/08/18   George Hugh, NP  atorvastatin (LIPITOR) 40 MG tablet Take 1 tablet (40 mg total) by mouth daily at 6 PM. 10/15/18   Lars Masson, MD  Cholecalciferol (VITAMIN D3) 2000 UNITS TABS Take 2,000 Int'l  Units by mouth daily.    [provider]  citalopram (CELEXA) 40 MG tablet Take 1 tablet (40 mg total) by mouth every morning. 11/30/18   [name], [name], MD  furosemide (LASIX) 20 MG tablet TAKE 1 TABLET BY MOUTH TWO  TIMES DAILY 02/11/19   Lars Masson, MD  golimumab (SIMPONI ARIA) 50 MG/4ML SOLN injection Inject 50 mg into the vein every 8 (eight) weeks.     [provider]  levothyroxine (SYNTHROID, LEVOTHROID) 150 MCG tablet TAKE 1 TABLET BY MOUTH  DAILY BEFORE BREAKFAST 11/22/18   Lars Masson, MD  losartan (COZAAR) 50 MG tablet Take 1 tablet (50 mg total) by mouth daily. 10/15/18   Lars Masson, MD  montelukast (SINGULAIR) 10 MG tablet TAKE 1 TABLET BY MOUTH AT  BEDTIME Patient taking differently: Take 10 mg by mouth at bedtime.  09/14/18   Nyoka Cowden, MD  OXYGEN Inhale 2 L into the lungs continuous. continuous o2     [provider]  pantoprazole (PROTONIX) 40 MG tablet TAKE 1 TABLET BY MOUTH  DAILY BEFORE BREAKFAST 02/07/19   Nyoka Cowden, MD  potassium chloride 20 MEQ/15ML (10%) SOLN Take 15 mLs (20 mEq total) by mouth 2 (two) times daily. 01/18/19   [name], [name], MD  potassium chloride SA (K-DUR,KLOR-CON) 20 MEQ tablet Take 1 tablet (20 mEq total) by mouth 2 (two) times daily. 11/26/18   [name], [name], MD  SYMBICORT 160-4.5 MCG/ACT inhaler USE 2 PUFFS BY MOUTH TWO  TIMES DAILY 02/07/19   Lars Masson, MD  tiotropium (SPIRIVA HANDIHALER) 18 MCG inhalation capsule Place 1 capsule (18 mcg total) into inhaler and inhale daily. 11/25/18   [name], [name], MD  traMADol (ULTRAM) 50 MG tablet TAKE 1-2 TABLETS EVERY 6 HOURS AS NEEDED FOR PAIN Patient taking differently: Take 50 mg by mouth 2 (two) times daily.  07/15/17   Gordy Savers, MD  triamcinolone cream (KENALOG) 0.1 % Apply 1 application topically 2 (two) times daily as needed (for psoriasis).     [provider]    Physical  Exam:  Constitutional: Obese elderly female who appears to be in mild respiratory distress Vitals:   03/10/19 1230 03/10/19 1300 03/10/19 1315 03/10/19 1430  BP:      Pulse: 96 96 96 (!) 103  Resp: (!) 21 (!) 21 (!) 21 16  Temp:      TempSrc:      SpO2: 95% 95% 95% 95%  Weight:      Height:       Eyes: PERRL, lids and conjunctivae normal ENMT: Mucous membranes are moist. Posterior pharynx clear of any exudate or lesions.   Neck: normal, supple, no masses, no thyromegaly Respiratory: Mildly tachypneic with prolonged expiratory wheeze appreciated throughout both lung fields.  No rhonchi noted.  Patient talking in shortened sentences on 2 L nasal cannula oxygen. Cardiovascular: Regular rate and rhythm, no murmurs / rubs / gallops. No extremity edema. 2+ pedal pulses. No carotid bruits.  Abdomen: no tenderness, no masses palpated. No hepatosplenomegaly. Bowel sounds positive.  Musculoskeletal: no clubbing / cyanosis. No joint deformity upper and lower extremities. Good ROM, no contractures. Normal muscle tone.  Skin: no rashes, lesions, ulcers. No induration Neurologic: CN 2-12 grossly intact. Sensation intact, DTR normal. Strength 5/5 in all 4.  Psychiatric: Normal judgment and insight. Alert and oriented x 3. Normal mood.     Labs on Admission: I have personally reviewed following labs and imaging studies  CBC: Recent Labs  Lab 03/10/19 1147  WBC 6.6  NEUTROABS 4.1  HGB 11.3*  HCT 37.8  MCV 91.5  PLT 164   Basic Metabolic Panel: Recent Labs  Lab 03/10/19 1147  NA 144  K 4.0  CL 100  CO2 33*  GLUCOSE 125*  BUN 17  CREATININE 0.91  CALCIUM 9.4   GFR: Estimated Creatinine Clearance: 54.8 mL/min (by C-G formula based on SCr of 0.91 mg/dL). Liver Function Tests: No results for input(s): AST, ALT, ALKPHOS, BILITOT, PROT, ALBUMIN in the last 168 hours. No results for input(s): LIPASE, AMYLASE in the last 168 hours. No results for input(s): AMMONIA in the last 168  hours. Coagulation Profile: No results for input(s): INR, PROTIME in the last 168 hours. Cardiac Enzymes: Recent Labs  Lab 03/10/19 1147  TROPONINI <0.03   BNP (last 3 results) No results for input(s): PROBNP in the last 8760 hours. HbA1C: No results for input(s): HGBA1C in the last 72 hours. CBG: No results for input(s): GLUCAP in the last 168 hours. Lipid Profile: No results for input(s): CHOL, HDL, LDLCALC, TRIG, CHOLHDL, LDLDIRECT in the last 72 hours. Thyroid Function Tests: No results for input(s): TSH, T4TOTAL, FREET4, T3FREE, THYROIDAB in the last 72 hours. Anemia Panel: No results for input(s): VITAMINB12, FOLATE, FERRITIN, TIBC, IRON, RETICCTPCT in the last 72 hours. Urine analysis:    Component Value Date/Time   COLORURINE YELLOW 05/31/2013 1412   APPEARANCEUR CLEAR 05/31/2013 1412   LABSPEC 1.027 05/31/2013 1412   PHURINE 5.0 05/31/2013 1412   GLUCOSEU NEGATIVE 05/31/2013 1412   HGBUR NEGATIVE 05/31/2013 1412   HGBUR negative 07/18/2010 0941   BILIRUBINUR SMALL (A) 05/31/2013 1412   BILIRUBINUR n 08/10/2012 1131   KETONESUR  NEGATIVE 05/31/2013 1412   PROTEINUR 30 (A) 05/31/2013 1412   UROBILINOGEN 0.2 05/31/2013 1412   NITRITE NEGATIVE 05/31/2013 1412   LEUKOCYTESUR NEGATIVE 05/31/2013 1412   Sepsis Labs: Recent Results (from the past 240 hour(s))  SARS Coronavirus 2 (CEPHEID- Performed in [facility] hospital lab), Hosp Order     Status: None   Collection Time: 03/10/19 12:00 PM  Result Value Ref Range Status   SARS Coronavirus 2 NEGATIVE NEGATIVE Final    Comment: (NOTE) If result is NEGATIVE SARS-CoV-2 target nucleic acids are NOT DETECTED. The SARS-CoV-2 RNA is generally detectable in upper and lower  respiratory specimens during the acute phase of infection. The lowest  concentration of SARS-CoV-2 viral copies this assay can detect is 250  copies / mL. A negative result does not preclude SARS-CoV-2 infection  and should not be used as the sole  basis for treatment or other  patient management decisions.  A negative result may occur with  improper specimen collection / handling, submission of specimen other  than nasopharyngeal swab, presence of viral mutation(s) within the  areas targeted by this assay, and inadequate number of viral copies  (<250 copies / mL). A negative result must be combined with clinical  observations, patient history, and epidemiological information. If result is POSITIVE SARS-CoV-2 target nucleic acids are DETECTED. The SARS-CoV-2 RNA is generally detectable in upper and lower  respiratory specimens dur ing the acute phase of infection.  Positive  results are indicative of active infection with SARS-CoV-2.  Clinical  correlation with patient history and other diagnostic information is  necessary to determine patient infection status.  Positive results do  not rule out bacterial infection or co-infection with other viruses. If result is PRESUMPTIVE POSTIVE SARS-CoV-2 nucleic acids MAY BE PRESENT.   A presumptive positive result was obtained on the submitted specimen  and confirmed on repeat testing.  While 2019 novel coronavirus  (SARS-CoV-2) nucleic acids may be present in the submitted sample  additional confirmatory testing may be necessary for epidemiological  and / or clinical management purposes  to differentiate between  SARS-CoV-2 and other Sarbecovirus currently known to infect humans.  If clinically indicated additional testing with an alternate test  methodology [phone]) is advised. The SARS-CoV-2 RNA is generally  detectable in upper and lower respiratory sp ecimens during the acute  phase of infection. The expected result is Negative. Fact Sheet for Patients:  BoilerBrush.com.cy Fact Sheet for Healthcare Providers: https://pope.com/ This test is not yet approved or cleared by the [place] FDA and has been authorized for detection  and/or diagnosis of SARS-CoV-2 by FDA under an Emergency Use Authorization (EUA).  This EUA will remain in effect (meaning this test can be used) for the duration of the COVID-19 declaration under Section 564(b)(1) of the Act, 21 U.S.C. section 360bbb-3(b)(1), unless the authorization is terminated or revoked sooner. Performed at [facility], [address] [place], [place] [address]      Radiological Exams on Admission: Ct Angio Chest Pe W And/or Wo Contrast  Result Date: 03/10/2019 CLINICAL DATA:  Acute presentation with shortness of breath and difficulty breathing. Symptoms began yesterday. EXAM: CT ANGIOGRAPHY CHEST WITH CONTRAST TECHNIQUE: Multidetector CT imaging of the chest was performed using the standard protocol during bolus administration of intravenous contrast. Multiplanar CT image reconstructions and MIPs were obtained to evaluate the vascular anatomy. CONTRAST:  OMNIPAQUE IOHEXOL 350 MG/ML SOLN COMPARISON:  Chest radiography same day.  Previous CT a 10/28/2018. FINDINGS: Cardiovascular: Pulmonary  arterial opacification is good. There are no pulmonary emboli. There is aortic atherosclerosis but no aneurysm or dissection. Previous median sternotomy and CABG. Heart size within normal limits. No pericardial fluid. Mediastinum/Nodes: No mass or lymphadenopathy. Normal mediastinal nodes. Lungs/Pleura: Chronic upper lobe scarring with mild emphysematous changes. Few scattered subpleural nodules and pulmonary scarring with near complete resolution of the widespread peripheral infiltrates seen in December. No worsening or new finding. No pleural effusion. Upper Abdomen: Normal Musculoskeletal: Ordinary thoracic degenerative changes. Partial compression fracture of T4 is old. Review of the MIP images confirms the above findings. IMPRESSION: No pulmonary emboli. Nearly completely resolved pulmonary infiltrates seen in December, perhaps with mild residual scarring. Aortic  atherosclerosis. Electronically Signed   By: Paulina Fusi M.D.   On: 03/10/2019 15:33   Dg Chest Portable 1 View  Result Date: 03/10/2019 CLINICAL DATA:  73 year old female with chest pain EXAM: PORTABLE CHEST 1 VIEW COMPARISON:  12/29/2010 FINDINGS: Cardiomediastinal silhouette unchanged in size and contour. Surgical changes of median sternotomy and CABG. Low lung volumes. No pneumothorax or pleural effusion. No confluent airspace disease. Coarsened interstitial markings are similar to prior. IMPRESSION: Low lung volumes and chronic changes without evidence of acute cardiopulmonary disease. Surgical changes of median sternotomy and CABG Electronically Signed   By: Gilmer Mor D.O.   On: 03/10/2019 12:16    EKG: Independently reviewed.  Sinus tachycardia at 113 bpm QTc 501  Assessment/Plan Respiratory failure with hypoxia with COPD exacerbation: Acute on chronic.  At baseline patient normally is on 2 L of nasal cannula oxygen, but presents with progressively worsening shortness of breath.  O2 saturations noted to drop to 70s with ambulation.  CT angiogram of the chest did not show any signs of pulmonary embolus or pneumonia.  Patient was given albuterol by inhaler until ruled out for COVID 19 lobe with ordered 25 mg of Solu-Medrol IV. -Admit to a MedSurg bed -Continuous nasal cannula oxygen of 2 L -Albuterol nebs 4 times daily -Brovana and budesonide nebs twice daily -Continue Spiriva -Azithromycin was initially started for prophylaxis, but QT noted to be prolonged at 501 -Solu-Medrol 60 mg IV every 8 hours -Physical therapy to eval and treat  Prolonged QT interval: Initial EKG noted QTc 501 -Recheck EKG in a.m.  Nausea and diarrhea: Patient makes note of nausea, and reports history of diarrhea over the last couple months.  Denies having any vomiting. -Avoiding antiemetics at this time due to QT interval -Check abdominal x-ray -Monitor intake and output -May benefit from referral to  gastroenterology   Anemia: Hemoglobin 11.3 on admission.  Denies any reports of bleeding. -Continue to monitor  Diastolic CHF, CAD s/p CABG: Patient noted to have EF of 65 to 70% with grade 1 diastolic dysfunction on last echocardiogram performed in 08/2018. She appears to be euvolemic on physical exam. -Continue aspirin, losartan, and furosemide  Essential hypertension -Continue losartan and furosemide  Hypothyroidism -Check TSH -Continue levothyroxine  Morbid obesity: BMI of 40.27 g/m    DVT prophylaxis: Lovenox Code Status: Full Family Communication: No family present at bedside Disposition Plan: Possible discharge home in 1 to 2 days Consults called: none Admission status: observation  Clydie Braun MD [facility] Pager [phone]   If 7PM-7AM, please contact night-coverage [email or web address] Password [facility]  03/10/2019, 3:57 PM

## 2019-03-10 NOTE — Telephone Encounter (Signed)
°  Patient's daughter calling to report last night her mother felt ill, concerned about having a heart atack    1. Are you currently SOB (can you hear that pt is SOB on the phone)? N/A  daughter states she is still asleep  2. How long have you been experiencing SOB? Patient has COPD  3. Are you SOB when sitting or when up moving around? Moving around  4. Are you currently experiencing any other symptoms? Nausea, HR 101 last night, burping, back pain

## 2019-03-10 NOTE — Telephone Encounter (Signed)
Pts daughter is calling to inform Dr. Delton See and myself  about symptoms the pt has been experiencing over the last couple days and nights. Was able to speak with daughter and pt at the same time and both state that  over the past few days and nights, the pt has had increased sob, nausea, breaks out in cold sweats, uses her rescue inhaler at night for sob while at rest, loose stools, productive cough with cream colored sputum, upper back pain, and feeling very fatigued.   Pts current V/S today are:  BP-141/7-HR-96 -98.1 TEMP Daughter is concerned if it is pneumonia vs cardiac related. Daughter is torn between which issue it is, being the pt has known respiratory issues and COPD, and known CAD, with hx of heart attack in the past.  I asked the pt if she was having any cardiac symptoms like chest pain, radiating pain to her neck, jaw, left arm, dizziness, pre-syncopal or syncopal issues, and the pt replied that she doesn't feel like its cardiac related, but more pulmonary related. Pt states she is always sob due to her COPD, but its a bit worse than it usually is, forcing her to utilize her rescue inhaler.  I advised the pt that given her complaints, and given she is on the fence about going to [facility] ER, she could call her PCP now and inquire about her issues and request to be seen to have a chest xray in the office (PCP has an x-ray machine in the office). Advised the pt that either way she needs to have a Provider listen to her heart and lungs and to advise on further testing. Advised the pt to call her PCP office now and ask what her advice would be, and call me back here at the office to let me know. Informed the pt that I will keep [name] in the loop by routing this communication and informing her of what the pt decided to do.  Pt states she will call her PCP now and she will call me right back with what she said.

## 2019-03-10 NOTE — Telephone Encounter (Signed)
Labored breathing had to use inhaler twice during the night, nausea, blood pressure slightly high you called our Cardiologist (Dr. Delton See) and was told to go to the ER and get an Xray and pt insist on seeing her primary care provider to have this done.  Pt state that her daughter is there with her she is not able to drive and would like to see if she could get the xray done this morning.  She stated that she has the same feeling that she had before when she had pneumonia.

## 2019-03-10 NOTE — Telephone Encounter (Signed)
Pt and daughter just called back and said that the pt called her PCP and endorsed her symptoms, and her PCP agreed with the history she has and complaints she has, she needs to go to [facility] ER right now for further testing.  Pt states that her PCP felt very strong about her going now to seek care.  Pt and daughter are now getting their things together and headed to [facility] ER.  Both parties requested that I let [name] know this, just incase this is cardiac in nature.  I reassured both parties that I will let [name] know she is coming to [facility] ER, by routing this communication to her.  Pt and daughter verbalized understanding and agreed with this plan. Both were more than gracious for all the time spent, giving them direction on how to best care for the pt.

## 2019-03-10 NOTE — ED Triage Notes (Signed)
Pt arrives to ED from home with complaints of shortness of breath and labored breathing starting yesterday and throughout the whole day. Pt stated she got so SOB last night that she used her rescue inhaler twice. Pt denies any pain.

## 2019-03-10 NOTE — ED Notes (Signed)
Pt ambulated to the restroom and back, spo2 fell to 76% on 2L coming back from the bathroom and pt was very winded.

## 2019-03-10 NOTE — Telephone Encounter (Signed)
Patient arrived at the ER 

## 2019-03-11 DIAGNOSIS — Z87891 Personal history of nicotine dependence: Secondary | ICD-10-CM | POA: Diagnosis not present

## 2019-03-11 DIAGNOSIS — J9621 Acute and chronic respiratory failure with hypoxia: Secondary | ICD-10-CM | POA: Diagnosis not present

## 2019-03-11 DIAGNOSIS — Z20828 Contact with and (suspected) exposure to other viral communicable diseases: Secondary | ICD-10-CM | POA: Diagnosis not present

## 2019-03-11 DIAGNOSIS — Z6841 Body Mass Index (BMI) 40.0 and over, adult: Secondary | ICD-10-CM | POA: Diagnosis not present

## 2019-03-11 DIAGNOSIS — E039 Hypothyroidism, unspecified: Secondary | ICD-10-CM | POA: Diagnosis not present

## 2019-03-11 DIAGNOSIS — J441 Chronic obstructive pulmonary disease with (acute) exacerbation: Secondary | ICD-10-CM | POA: Diagnosis not present

## 2019-03-11 DIAGNOSIS — Z7951 Long term (current) use of inhaled steroids: Secondary | ICD-10-CM | POA: Diagnosis not present

## 2019-03-11 DIAGNOSIS — I4510 Unspecified right bundle-branch block: Secondary | ICD-10-CM | POA: Diagnosis not present

## 2019-03-11 DIAGNOSIS — I110 Hypertensive heart disease with heart failure: Secondary | ICD-10-CM | POA: Diagnosis not present

## 2019-03-11 DIAGNOSIS — I5033 Acute on chronic diastolic (congestive) heart failure: Secondary | ICD-10-CM | POA: Diagnosis not present

## 2019-03-11 DIAGNOSIS — D649 Anemia, unspecified: Secondary | ICD-10-CM | POA: Diagnosis not present

## 2019-03-11 LAB — CBC
HCT: 33.3 % — ABNORMAL LOW (ref 36.0–46.0)
Hemoglobin: 10.1 g/dL — ABNORMAL LOW (ref 12.0–15.0)
MCH: 27.5 pg (ref 26.0–34.0)
MCHC: 30.3 g/dL (ref 30.0–36.0)
MCV: 90.7 fL (ref 80.0–100.0)
Platelets: 153 10*3/uL (ref 150–400)
RBC: 3.67 MIL/uL — ABNORMAL LOW (ref 3.87–5.11)
RDW: 14.7 % (ref 11.5–15.5)
WBC: 7.2 10*3/uL (ref 4.0–10.5)
nRBC: 0 % (ref 0.0–0.2)

## 2019-03-11 LAB — BASIC METABOLIC PANEL
Anion gap: 7 (ref 5–15)
BUN: 20 mg/dL (ref 8–23)
CO2: 33 mmol/L — ABNORMAL HIGH (ref 22–32)
Calcium: 9.1 mg/dL (ref 8.9–10.3)
Chloride: 102 mmol/L (ref 98–111)
Creatinine, Ser: 0.87 mg/dL (ref 0.44–1.00)
GFR calc Af Amer: >60 mL/min (ref >60)
GFR calc non Af Amer: >60 mL/min (ref >60)
Glucose, Bld: 135 mg/dL — ABNORMAL HIGH (ref 70–99)
Potassium: 4.5 mmol/L (ref 3.5–5.1)
Sodium: 142 mmol/L (ref 135–145)

## 2019-03-11 LAB — T4, FREE: Free T4: 1.06 ng/dL (ref 0.82–1.77)

## 2019-03-11 LAB — TSH: TSH: 0.081 u[IU]/mL — ABNORMAL LOW (ref 0.350–4.500)

## 2019-03-11 MED ORDER — SODIUM CHLORIDE 0.9 % IV BOLUS
500.0000 mL | Freq: Once | INTRAVENOUS | Status: AC
  Administered 2019-03-11: 500 mL via INTRAVENOUS

## 2019-03-11 MED ORDER — DOXYCYCLINE HYCLATE 100 MG PO CAPS: 100.0000 mg | ORAL_CAPSULE | Freq: Two times a day (BID) | ORAL | 0 refills | Status: AC

## 2019-03-11 MED ORDER — PREDNISONE 10 MG PO TABS: ORAL_TABLET | ORAL | 0 refills | Status: DC

## 2019-03-11 NOTE — Progress Notes (Signed)
Patient noted hypotensive Hospitalist notified, orders received to administer bolus to help restore fluid volume.  Patient denies dizziness or syncope.  Will continue to monitor.

## 2019-03-11 NOTE — Evaluation (Signed)
Physical Therapy Evaluation Patient Details Name: [name] MRN: [id number] DOB: 03/05/1946 Today's Date: 03/11/2019   History of Present Illness  [name] is a 73 y.o. female with medical history significant of COPD, chronic respiratory failure on 2 L of nasal cannula oxygen, CHF, CAD s/p 3v CABG, psoriasis, and GERD; who presents with respiratory worsening shortness of breath for the last 3 to 4 days.    Clinical Impression  Patient evaluated by Physical Therapy with no further acute PT needs identified. All education has been completed and the patient has no further questions. PTA, pt living alone, independent with mobility, on 2L O2 at baseline. Today pt walking 150' without assistance, Desat on 2L to 85%, remains above 91% on 3L.  See below for any follow-up Physical Therapy or equipment needs. PT is signing off. Thank you for this referral.     Follow Up Recommendations No PT follow up    Equipment Recommendations  None recommended by PT    Recommendations for Other Services       Precautions / Restrictions Precautions Precautions: Fall      Mobility  Bed Mobility Overal bed mobility: Independent                Transfers Overall transfer level: Independent                  Ambulation/Gait Ambulation/Gait assistance: Modified independent (Device/Increase time) Gait Distance (Feet): 150 Feet Assistive device: None Gait Pattern/deviations: Step-to pattern Gait velocity: decreased   General Gait Details: good vleocity, de sats on 2L, remains above 90% on 3L.  Stairs            Wheelchair Mobility    Modified Rankin (Stroke Patients Only)       Balance Overall balance assessment: Modified Independent                                           Pertinent Vitals/Pain Pain Assessment: No/denies pain    Home Living Family/patient expects to be discharged to:: Private residence Living Arrangements:  Alone Available Help at Discharge: Family;Available PRN/intermittently Type of Home: House Home Access: Stairs to enter Entrance Stairs-Rails: Right;Left;Can reach both Entrance Stairs-Number of Steps: 5 Home Layout: One level Home Equipment: Shower seat;Bedside commode;Walker - 4 wheels;Walker - 2 wheels      Prior Function Level of Independence: Independent         Comments: 2L O2 at all time     Hand Dominance   Dominant Hand: Right    Extremity/Trunk Assessment   Upper Extremity Assessment Upper Extremity Assessment: Overall WFL for tasks assessed    Lower Extremity Assessment Lower Extremity Assessment: Overall WFL for tasks assessed    Cervical / Trunk Assessment Cervical / Trunk Assessment: Normal  Communication   Communication: No difficulties  Cognition Arousal/Alertness: Awake/alert Behavior During Therapy: WFL for tasks assessed/performed Overall Cognitive Status: Within Functional Limits for tasks assessed                                        General Comments      Exercises     Assessment/Plan    PT Assessment Patent does not need any further PT services  PT Problem List  PT Treatment Interventions      PT Goals (Current goals can be found in the Care Plan section)  Acute Rehab PT Goals Patient Stated Goal: go home when can PT Goal Formulation: With patient Time For Goal Achievement: 03/25/19 Potential to Achieve Goals: Good    Frequency     Barriers to discharge        Co-evaluation               AM-PAC PT "6 Clicks" Mobility  Outcome Measure Help needed turning from your back to your side while in a flat bed without using bedrails?: None Help needed moving from lying on your back to sitting on the side of a flat bed without using bedrails?: None Help needed moving to and from a bed to a chair (including a wheelchair)?: None Help needed standing up from a chair using your arms (e.g., wheelchair  or bedside chair)?: None Help needed to walk in hospital room?: None Help needed climbing 3-5 steps with a railing? : None 6 Click Score: 24    End of Session   Activity Tolerance: Patient tolerated treatment well Patient left: in bed;with call bell/phone within reach Nurse Communication: Mobility status      Time: 0840-0910 PT Time Calculation (min) (ACUTE ONLY): 30 min   Charges:   PT Evaluation $PT Eval Low Complexity: 1 Low PT Treatments $Gait Training: 8-22 mins      Etta Grandchild, PT, DPT Acute Rehabilitation Services Pager: [phone] Office: [phone]    Etta Grandchild 03/11/2019, 10:04 AM

## 2019-03-11 NOTE — Discharge Summary (Signed)
Physician Discharge Summary  [name] ZOX:[id number] DOB: 1946/09/18 DOA: 03/10/2019  PCP: [name], [name], MD  Admit date: 03/10/2019 Discharge date: 03/11/2019  Time spent: 40 minutes  Recommendations for Outpatient Follow-up:  1. Follow up outpatient CBC/CMP 2. Follow up with PCP and pulmonology 3. Follow up home o2 requirements, increased to 3 L today at d/c 4. Follow up chronic diarrhea outpatient 5. Follow up repeat thyroid function tests and adjust synthroid as needed 6. Follow up celexa dosing as outpatient.  Pharmacy called with concern for QTc.  Pt QTc improved at discharge, but recommendations for max dose of celexa of 20 mg for pt's over 60.  Attempted to call x2, but no answer.   Discharge Diagnoses:  Principal Problem:   COPD exacerbation (HCC) Active Problems:   Hypothyroidism   Essential hypertension   Morbid (severe) obesity due to excess calories (HCC)   S/P CABG x 3   Acute on chronic respiratory failure with hypoxia (HCC)   Discharge Condition: stable  Diet recommendation: heart healthy  Filed Weights   03/10/19 1125  Weight: 90.4 kg    History of present illness:  [name] is [name] 73 y.o. female with medical history significant of COPD, chronic respiratory failure on 2 L of nasal cannula oxygen, CHF, CAD s/p 3v CABG, psoriasis, and GERD; who presents with respiratory worsening shortness of breath for the last 3 to 4 days.  She reports chronically having [name] productive cough that has been unchanged, but her breathing has been more labored.  She reports having to use her rescue albuterol inhaler twice Linsy Ehresman day which is unusual for her.  Symptoms progressively worsened to the point which she had to use her albuterol inhaler overnight with some mild relief.  Denies having any changes in her weight, fever, leg swelling, chest pain, abdominal pain, or dysuria.  Also complaining of nausea and diarrhea symptoms.  Planes of having anywhere from  4-5 bowel movements Vonetta Foulk day that has been present over the last couple months.   She was admitted for [name] COPD exacberation.  She was improved on hospital day 1 and discharged with steroid taper and increase in home O2 to 3 L from 2.  See below for additional details  Hospital Course:  Respiratory failure with hypoxia with COPD exacerbation: Acute on chronic.  At baseline patient normally is on 2 L of nasal cannula oxygen, but presents with progressively worsening shortness of breath.  O2 saturations noted to drop to 70s with ambulation.  CT angiogram of the chest did not show any signs of pulmonary embolus or pneumonia.   - Improved on hospital day 1, d/c home with steroid taper, continue home nebs, pt worked with PT and required 3 L to maintain sats, d/c with increased home O2 - d/c on doxycycline   Prolonged QT interval: Initial EKG noted QTc 501 - improved this morning - pharmacy called after d/c with concern about celexa dose, follow up outpatient, consider decreasing to 20 mg daily  Nausea and diarrhea: Patient makes note of nausea, and reports history of diarrhea over the last couple months.  Denies having any vomiting. - Follow up outpatient   Anemia: Follow up outpatient  Diastolic CHF, CAD s/p CABG: Patient noted to have EF of 65 to 70% with grade 1 diastolic dysfunction on last echocardiogram performed in 08/2018. She appears to be euvolemic on physical exam. -Continue aspirin, losartan, and furosemide  Essential hypertension -Continue losartan and furosemide  Hypothyroidism - follow  up repeat TFT outpatient - TSH suppressed repeat outpatient, consider change in synthroid dose  Morbid obesity: BMI of 40.27 g/m  Procedures:  none  Consultations:  none  Discharge Exam: Vitals:   03/11/19 0748 03/11/19 1122  BP:    Pulse:    Resp:    Temp:    SpO2: 96% 97%   Feels ready for dc Breathing better  General: No acute distress. Cardiovascular: Heart sounds  show Marten Iles regular rate, and rhythm. Lungs: scattered wheeze, good air movement, no increased wob Abdomen: Soft, nontender, nondistended  Neurological: Alert and oriented 3. Moves all extremities 4 with equal strength. Cranial nerves II through XII grossly intact. Skin: Warm and dry. No rashes or lesions. Extremities: No clubbing or cyanosis. No edema. Psychiatric: Mood and affect are normal. Insight and judgment are appropriate.  Discharge Instructions   Discharge Instructions    Call MD for:  difficulty breathing, headache or visual disturbances   Complete by:  As directed    Call MD for:  extreme fatigue   Complete by:  As directed    Call MD for:  hives   Complete by:  As directed    Call MD for:  persistant dizziness or light-headedness   Complete by:  As directed    Call MD for:  persistant nausea and vomiting   Complete by:  As directed    Call MD for:  redness, tenderness, or signs of infection (pain, swelling, redness, odor or green/yellow discharge around incision site)   Complete by:  As directed    Call MD for:  severe uncontrolled pain   Complete by:  As directed    Call MD for:  temperature >100.4   Complete by:  As directed    Diet - low sodium heart healthy   Complete by:  As directed    Diet - low sodium heart healthy   Complete by:  As directed    Discharge instructions   Complete by:  As directed    You were seen for [name] COPD exacerbation.  You've improved with steroids, inhaled bronchodilators (nebulized medications), and antibiotics.  We'll send you home with Dublin Cantero steroid taper and antibiotics to complete.  Continue your albuterol inhaler every 4-6 hours, scheduled, over the next 1-2 days until you feel back to normal.  Then resume taking this as needed.  You'll need to increase your oxygen to 3 L with activity for now.  Follow up with your PCP and lung doctor to see if this change needs to be long term.  Follow up with your PCP and lung doctor.  Return for  new, recurrent, or worsening symptoms.  Please ask your PCP to request records from this hospitalization so they know what was done and what the next steps will be.   Increase activity slowly   Complete by:  As directed    Increase activity slowly   Complete by:  As directed      Allergies as of 03/11/2019   No Known Allergies     Medication List    TAKE these medications   albuterol 108 (90 Base) MCG/ACT inhaler Commonly known as:  ProAir HFA 2 puffs every 4 hours as needed only  if your can't catch your breath What changed:    how much to take  how to take this  when to take this  reasons to take this  additional instructions   aspirin 81 MG tablet Take 1 tablet (81 mg total) by mouth daily.  atorvastatin 40 MG tablet Commonly known as:  LIPITOR Take 1 tablet (40 mg total) by mouth daily at 6 PM.   citalopram 40 MG tablet Commonly known as:  CELEXA Take 1 tablet (40 mg total) by mouth every morning.   doxycycline 100 MG capsule Commonly known as:  VIBRAMYCIN Take 1 capsule (100 mg total) by mouth 2 (two) times daily for 4 days.   furosemide 20 MG tablet Commonly known as:  LASIX TAKE 1 TABLET BY MOUTH TWO  TIMES DAILY What changed:  when to take this   levothyroxine 150 MCG tablet Commonly known as:  SYNTHROID TAKE 1 TABLET BY MOUTH  DAILY BEFORE BREAKFAST What changed:  See the new instructions.   losartan 50 MG tablet Commonly known as:  COZAAR Take 1 tablet (50 mg total) by mouth daily.   montelukast 10 MG tablet Commonly known as:  SINGULAIR TAKE 1 TABLET BY MOUTH AT  BEDTIME   OXYGEN Inhale 2 L into the lungs continuous. continuous o2   pantoprazole 40 MG tablet Commonly known as:  PROTONIX TAKE 1 TABLET BY MOUTH  DAILY BEFORE BREAKFAST What changed:  See the new instructions.   predniSONE 10 MG tablet Commonly known as:  DELTASONE Take 4 tablets (40 mg total) by mouth daily for 3 days, THEN 3 tablets (30 mg total) daily for 3 days, THEN  2 tablets (20 mg total) daily for 3 days, THEN 1 tablet (10 mg total) daily for 3 days. Start taking on:  Mar 11, 2019   Simponi Aria 50 MG/4ML Soln injection Generic drug:  golimumab Inject 50 mg into the vein every 8 (eight) weeks.   Symbicort 160-4.5 MCG/ACT inhaler Generic drug:  budesonide-formoterol USE 2 PUFFS BY MOUTH TWO  TIMES DAILY What changed:  See the new instructions.   tiotropium 18 MCG inhalation capsule Commonly known as:  Spiriva HandiHaler Place 1 capsule (18 mcg total) into inhaler and inhale daily.   traMADol 50 MG tablet Commonly known as:  ULTRAM TAKE 1-2 TABLETS EVERY 6 HOURS AS NEEDED FOR PAIN What changed:    how much to take  how to take this  when to take this  additional instructions   triamcinolone cream 0.1 % Commonly known as:  KENALOG Apply 1 application topically 2 (two) times daily as needed (for psoriasis).   Tylenol Arthritis Pain 650 MG CR tablet Generic drug:  acetaminophen Take 1,300 mg by mouth 2 (two) times daily.   Vitamin D3 50 MCG (2000 UT) Tabs Take 2,000 Int'l Units by mouth daily.      No Known Allergies Follow-up Information    [name], [name], MD Follow up.   Specialty:  Internal Medicine Contact information: [address] [place] [address] [phone]        Lars Masson, MD .   Specialty:  Cardiology Contact information: [address] [place] [address] [phone]            The results of significant diagnostics from this hospitalization (including imaging, microbiology, ancillary and laboratory) are listed below for reference.    Significant Diagnostic Studies: Ct Angio Chest Pe W And/or Wo Contrast  Result Date: 03/10/2019 CLINICAL DATA:  Acute presentation with shortness of breath and difficulty breathing. Symptoms began yesterday. EXAM: CT ANGIOGRAPHY CHEST WITH CONTRAST TECHNIQUE: Multidetector CT imaging of the chest was performed using  the standard protocol during bolus administration of intravenous contrast. Multiplanar CT image reconstructions and MIPs were obtained to  evaluate the vascular anatomy. CONTRAST:  OMNIPAQUE IOHEXOL 350 MG/ML SOLN COMPARISON:  Chest radiography same day.  Previous CT Brylei Pedley 10/28/2018. FINDINGS: Cardiovascular: Pulmonary arterial opacification is good. There are no pulmonary emboli. There is aortic atherosclerosis but no aneurysm or dissection. Previous median sternotomy and CABG. Heart size within normal limits. No pericardial fluid. Mediastinum/Nodes: No mass or lymphadenopathy. Normal mediastinal nodes. Lungs/Pleura: Chronic upper lobe scarring with mild emphysematous changes. Few scattered subpleural nodules and pulmonary scarring with near complete resolution of the widespread peripheral infiltrates seen in December. No worsening or new finding. No pleural effusion. Upper Abdomen: Normal Musculoskeletal: Ordinary thoracic degenerative changes. Partial compression fracture of T4 is old. Review of the MIP images confirms the above findings. IMPRESSION: No pulmonary emboli. Nearly completely resolved pulmonary infiltrates seen in December, perhaps with mild residual scarring. Aortic atherosclerosis. Electronically Signed   By: Paulina Fusi M.D.   On: 03/10/2019 15:33   Dg Chest Portable 1 View  Result Date: 03/10/2019 CLINICAL DATA:  73 year old female with chest pain EXAM: PORTABLE CHEST 1 VIEW COMPARISON:  12/29/2010 FINDINGS: Cardiomediastinal silhouette unchanged in size and contour. Surgical changes of median sternotomy and CABG. Low lung volumes. No pneumothorax or pleural effusion. No confluent airspace disease. Coarsened interstitial markings are similar to prior. IMPRESSION: Low lung volumes and chronic changes without evidence of acute cardiopulmonary disease. Surgical changes of median sternotomy and CABG Electronically Signed   By: Gilmer Mor D.O.   On: 03/10/2019 12:16   Dg Abd Portable  1v  Result Date: 03/10/2019 CLINICAL DATA:  Diarrhea EXAM: PORTABLE ABDOMEN - 1 VIEW COMPARISON:  None. FINDINGS: Limited by large body habitus. Surgical clips in the right upper quadrant. Nonobstructed gas pattern. No radiopaque calculi. IMPRESSION: Nonobstructed bowel gas pattern Electronically Signed   By: Jasmine Pang M.D.   On: 03/10/2019 20:37    Microbiology: Recent Results (from the past 240 hour(s))  SARS Coronavirus 2 (CEPHEID- Performed in [facility] hospital lab), Hosp Order     Status: None   Collection Time: 03/10/19 12:00 PM  Result Value Ref Range Status   SARS Coronavirus 2 NEGATIVE NEGATIVE Final    Comment: (NOTE) If result is NEGATIVE SARS-CoV-2 target nucleic acids are NOT DETECTED. The SARS-CoV-2 RNA is generally detectable in upper and lower  respiratory specimens during the acute phase of infection. The lowest  concentration of SARS-CoV-2 viral copies this assay can detect is 250  copies / mL. Kadie Balestrieri negative result does not preclude SARS-CoV-2 infection  and should not be used as the sole basis for treatment or other  patient management decisions.  Echo [place] negative result may occur with  improper specimen collection / handling, submission of specimen other  than nasopharyngeal swab, presence of viral mutation(s) within the  areas targeted by this assay, and inadequate number of viral copies  (<250 copies / mL). Isma Tietje negative result must be combined with clinical  observations, patient history, and epidemiological information. If result is POSITIVE SARS-CoV-2 target nucleic acids are DETECTED. The SARS-CoV-2 RNA is generally detectable in upper and lower  respiratory specimens dur ing the acute phase of infection.  Positive  results are indicative of active infection with SARS-CoV-2.  Clinical  correlation with patient history and other diagnostic information is  necessary to determine patient infection status.  Positive results do  not rule out bacterial infection or  co-infection with other viruses. If result is PRESUMPTIVE POSTIVE SARS-CoV-2 nucleic acids MAY BE PRESENT.   Zahriyah Joo presumptive positive result was obtained on  the submitted specimen  and confirmed on repeat testing.  While 2019 novel coronavirus  (SARS-CoV-2) nucleic acids may be present in the submitted sample  additional confirmatory testing may be necessary for epidemiological  and / or clinical management purposes  to differentiate between  SARS-CoV-2 and other Sarbecovirus currently known to infect humans.  If clinically indicated additional testing with an alternate test  methodology [phone]) is advised. The SARS-CoV-2 RNA is generally  detectable in upper and lower respiratory sp ecimens during the acute  phase of infection. The expected result is Negative. Fact Sheet for Patients:  BoilerBrush.com.cy Fact Sheet for Healthcare Providers: https://pope.com/ This test is not yet approved or cleared by the [place] FDA and has been authorized for detection and/or diagnosis of SARS-CoV-2 by FDA under an Emergency Use Authorization (EUA).  This EUA will remain in effect (meaning this test can be used) for the duration of the COVID-19 declaration under Section 564(b)(1) of the Act, 21 U.S.C. section 360bbb-3(b)(1), unless the authorization is terminated or revoked sooner. Performed at [facility], [address], [place], [place] [address]      Labs: Basic Metabolic Panel: Recent Labs  Lab 03/10/19 1147 03/11/19 0408  NA 144 142  K 4.0 4.5  CL 100 102  CO2 33* 33*  GLUCOSE 125* 135*  BUN 17 20  CREATININE 0.91 0.87  CALCIUM 9.4 9.1   Liver Function Tests: No results for input(s): AST, ALT, ALKPHOS, BILITOT, PROT, ALBUMIN in the last 168 hours. No results for input(s): LIPASE, AMYLASE in the last 168 hours. No results for input(s): AMMONIA in the last 168 hours. CBC: Recent Labs  Lab 03/10/19 1147  03/11/19 0408  WBC 6.6 7.2  NEUTROABS 4.1  --   HGB 11.3* 10.1*  HCT 37.8 33.3*  MCV 91.5 90.7  PLT 164 153   Cardiac Enzymes: Recent Labs  Lab 03/10/19 1147  TROPONINI <0.03   BNP: BNP (last 3 results) Recent Labs    10/28/18 1400 11/01/18 1313 11/04/18 0232  BNP 226.5* 563.3* 264.9*    ProBNP (last 3 results) No results for input(s): PROBNP in the last 8760 hours.  CBG: No results for input(s): GLUCAP in the last 168 hours.     Signed:  Lacretia Nicks MD.  [facility] 03/11/2019, 11:37 AM

## 2019-03-11 NOTE — Care Management Obs Status (Signed)
MEDICARE OBSERVATION STATUS NOTIFICATION   Patient Details  Name: [name] MRN: [id number] Date of Birth: 1946-10-12   Medicare Observation Status Notification Given:  Yes    Leone Haven, RN 03/11/2019, 12:18 PM

## 2019-03-11 NOTE — TOC Transition Note (Addendum)
Transition of Care [facility]) - CM/SW Discharge Note   Patient Details  Name: [name] MRN: [id number] Date of Birth: [birth year]  [facility] CM/SW Contact:  Leone Haven, RN Phone Number: 03/11/2019, 12:24 PM   Clinical Narrative:    From home, copd ex, for dc today, patient states she has an aide that sits with her for socialization on Wednesday's from 4 to 5 hrs with [employer] [place], she has home oxygen with [employer]  2 liters. She states her daughter will be transporting her home at discharge.  Per PT eval , no PT f/u needed.   Final next level of care: Home/Self Care Barriers to Discharge: No Barriers Identified   Patient Goals and CMS Choice Patient states their goals for this hospitalization and ongoing recovery are:: go home and get better   Choice offered to / list presented to : NA  Discharge Placement                       Discharge Plan and Services In-house Referral: NA Discharge Planning Services: CM Consult Post Acute Care Choice: NA          DME Arranged: N/A DME Agency: NA                  Social Determinants of Health (SDOH) Interventions     Readmission Risk Interventions No flowsheet data found.

## 2019-03-14 ENCOUNTER — Telehealth: Payer: Self-pay | Admitting: *Deleted

## 2019-03-14 NOTE — Telephone Encounter (Signed)
Transition Care Management Follow-up Telephone Call   Date discharged? 03/11/2019    How have you been since you were released from the hospital? Doing just fine    Do you understand why you were in the hospital? yes   Do you understand the discharge instructions? yes   Where were you discharged to? Home    Items Reviewed:  Medications reviewed: yes  Allergies reviewed: yes  Dietary changes reviewed: N/A  Referrals reviewed: N/A   Functional Questionnaire:   Activities of Daily Living (ADLs):   She states they are independent in the following: ambulation, bathing and hygiene, feeding, continence, grooming, toileting and dressing States they require assistance with the following: N/A    Any transportation issues/concerns?: no   Any patient concerns? Yes  Hospital informed her thyroid level is a little off, having multiple loose stools with burning    Confirmed importance and date/time of follow-up visits scheduled yes   Provider Appointment booked with Dr. Ardyth Harps on 03/15/2019 at 3:30 PM (virtual visit)   Confirmed with patient if condition begins to worsen call PCP or go to the ER.  Patient was given the office number and encouraged to call back with question or concerns.  : yes

## 2019-03-15 ENCOUNTER — Ambulatory Visit (INDEPENDENT_AMBULATORY_CARE_PROVIDER_SITE_OTHER): Payer: Medicare Other | Admitting: Internal Medicine

## 2019-03-15 ENCOUNTER — Other Ambulatory Visit: Payer: Self-pay

## 2019-03-15 DIAGNOSIS — J441 Chronic obstructive pulmonary disease with (acute) exacerbation: Secondary | ICD-10-CM | POA: Diagnosis not present

## 2019-03-15 DIAGNOSIS — E039 Hypothyroidism, unspecified: Secondary | ICD-10-CM

## 2019-03-15 MED ORDER — LEFLUNOMIDE 20 MG PO TABS: 20.0000 mg | ORAL_TABLET | Freq: Every day | ORAL | Status: DC

## 2019-03-15 MED ORDER — LEVOTHYROXINE SODIUM 125 MCG PO TABS: 125.0000 ug | ORAL_TABLET | Freq: Every day | ORAL | 3 refills | Status: DC

## 2019-03-15 NOTE — Progress Notes (Signed)
Virtual Visit via Video Note  I connected with [name] on 03/15/19 at  3:30 PM EDT by a video enabled telemedicine application and verified that I am speaking with the correct person using two identifiers.  Location patient: home Location provider: work office Persons participating in the virtual visit: patient, provider  I discussed the limitations of evaluation and management by telemedicine and the availability of in person appointments. The patient expressed understanding and agreed to proceed.   HPI: Patient is a 73 y/o woman who is coming in today for ransitional care services visit.   Dates hospitalized: 5/7-03/11/2019 Days since discharge from hospital: 4 Patient was discharged from the hospital to: home Reason for admission to hospital: COPD with acute exacerbation Date of interactive phone contact with patient and/or caregiver: 03/14/2019  I have reviewed in detail patient's discharge summary plus pertinent specific notes, labs, and images from the hospitalization.  She was admitted overnight due to SOB thought secondary to COPD with acute exacerbation and hypoxemia despite home oxygen dose. Her COVID-19 test was negative and her CXR was without evidence for CAP. She was given a weeks worth of doxycycline and a prednisone taper that she is taking as prescribed. She feels much improved. Her oxygen levels have stayed above 93% since DC.  Medication reconciliation was done today and patient is taking meds as recommended by discharging hospitalist/specialist.    ROS: Constitutional: Denies fever, chills, diaphoresis, appetite change and fatigue.  HEENT: Denies photophobia, eye pain, redness, hearing loss, ear pain, congestion, sore throat, rhinorrhea, sneezing, mouth sores, trouble swallowing, neck pain, neck stiffness and tinnitus.   Respiratory: Denies SOB, DOE, cough, chest tightness,  and wheezing.   Cardiovascular: Denies chest pain, palpitations and leg  swelling.  Gastrointestinal: Denies nausea, vomiting, abdominal pain, diarrhea, constipation, blood in stool and abdominal distention.  Genitourinary: Denies dysuria, urgency, frequency, hematuria, flank pain and difficulty urinating.  Endocrine: Denies: hot or cold intolerance, sweats, changes in hair or nails, polyuria, polydipsia. Musculoskeletal: Denies myalgias, back pain, joint swelling, arthralgias and gait problem.  Skin: Denies pallor, rash and wound.  Neurological: Denies dizziness, seizures, syncope, weakness, light-headedness, numbness and headaches.  Hematological: Denies adenopathy. Easy bruising, personal or family bleeding history  Psychiatric/Behavioral: Denies suicidal ideation, mood changes, confusion, nervousness, sleep disturbance and agitation   Past Medical History:  Diagnosis Date  . Acute bronchitis 09/25/2015  . ACUTE ON CHRONIC DIASTOLIC HEART FAILURE 12/04/2010   Qualifier: Diagnosis of  By: Ladona Ridgel, MD, [facility], [place]con   . Arthritis    OA RIGHT KNEE WITH PAIN  . Barrett esophagus   . Bradycardia 06/01/2015  . CHF (congestive heart failure) (HCC)   . Chronic respiratory failure (HCC)   . Chronic respiratory failure with hypoxia and hypercapnia (HCC) 02/04/2010   Followed in Pulmonary clinic/ [facility]/ [facility]       - 02 dependent  since 07/02/10 >>  83% RA December 05, 2010       - ONO RA 08/05/12  :  Positive sat < 89 x 2:33m> repeat on 2lpm rec 08/12/2012  - 06/17/2013 reported desat with activity p Knee surgery > rec restart 2lpm with activity  - 06/27/2013   Walked 2lpm  x one lap @ 185 stopped due to sat 88% not sob , desat to 82% on RA just at th  . COPD (chronic obstructive pulmonary disease) (HCC)   . COPD III spirometry if use FEV1/VC p saba  07/18/2010   Quit smoking  May 2006       - PFT's  04/12/10 FEV1  1.21 (69%) ratio 77 and no change p B2,  DLC0 56%   VC 70%         - PFTs  08/08/2013 FEV1 1.21 (60%) ratio 86 and no change p B2 DLCO 79%  VC 72%  On  symbicort 160 2bid  - PFT's  02/08/2018  FEV1 0.70 (40 % ) ratio 56 if use FEV1/VC  p 38 % improvement from saba p symb 160 prior to study with DLCO  78 % corrects to 147  % for alv volume   - 02/08/2018  . Cough variant asthma 02/26/2011   Followed in Pulmonary clinic/ [facility]/ [facility]  - PFT's  06/04/15  FEV1 1.20 (67 % ) ratio 83  p 6 % improvement from saba with DLCO  80 % corrects to 132 % for alv volume      - Clinical dx based on response to symbicort       FENO 09/16/2016  =   96 on symbicort 160 2bid > added singulair  Allergy profile 09/16/2016 >  Eos 0.5 /  IgE  78 neg RAST  -  Referred to rehab 04/29/2017 > completed  . DOE (dyspnea on exertion) 02/19/2016  . Essential hypertension 04/20/2007   Qualifier: Diagnosis of  By: Marcelyn DittyMondi, RN, Katy FitchJudy Ann   . GERD (gastroesophageal reflux disease)   . History of ARDS 2006  . History of home oxygen therapy    AT NIGHT WHEN SLEEPING 2 L / MIN NASAL CANNULA  . Hyperlipidemia 07/12/2015  . Hypertension   . Hypothyroidism   . Morbid (severe) obesity due to excess calories (HCC) 04/22/2015   pfts with erv 14% 06/04/15  And 33% 02/08/2018   . NSTEMI (non-ST elevated myocardial infarction) (HCC) 05/31/2015  . Pneumococcal pneumonia (HCC) 2006   HOSPITALIZED AND DEVELOPED ARDS  . Psoriatic arthritis (HCC)   . Pulmonary fibrosis (HCC)   . PULMONARY FIBROSIS ILD POST INFLAMMATORY CHRONIC 07/18/2010   Followed as Primary Care Patient/  Healthcare/ [facility]  -s/p ARDS 2006 with bacteremic S  Pna       - CT chest 07/03/10 Nonspecific PF mostly upper lobes       - CT chest 12/03/10 acute gg changes and effusions c/w chf - PFT's  02/08/2018  FVC 0.64 (28 %)   with DLCO  78 % corrects to 147 % for alv volume     . Rheumatic disease   . 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 SOB (shortness of breath) on exertion   . Stroke [facility](HCC)     Past Surgical History:  Procedure Laterality Date  . ABDOMINOPLASTY    . CARDIAC CATHETERIZATION N/A 06/01/2015   Procedure: Left Heart Cath and  Coronary Angiography;  Surgeon: Runell GessJonathan J Berry, MD;  [facility];  Service: Cardiovascular;  Laterality: N/A;  . CARPAL TUNNEL RELEASE    . CHOLECYSTECTOMY    . CORONARY ARTERY BYPASS GRAFT N/A 06/04/2015   Procedure: CORONARY ARTERY BYPASS GRAFT times three            with left internal mammary artery and right leg saphenous vein;  Surgeon: Alleen BorneBryan K Bartle, MD;  Location: [facility] OR;  Service: Open Heart Surgery;  Laterality: N/A;  . cosmetic breast surgery    . JOINT REPLACEMENT    . KNEE ARTHROSCOPY Left   . TEE WITHOUT CARDIOVERSION  06/04/2015   Procedure: TRANSESOPHAGEAL ECHOCARDIOGRAM (TEE);  Surgeon: Alleen Borne, MD;  Location: [facility] OR;  Service: Open Heart Surgery;;  . TOTAL KNEE ARTHROPLASTY Left   . TOTAL KNEE ARTHROPLASTY Right 06/06/2013   Procedure: RIGHT TOTAL KNEE ARTHROPLASTY;  Surgeon: Loanne Drilling, MD;  Location: [facility] ORS;  Service: Orthopedics;  Laterality: Right;    Family History  Problem Relation Age of Onset  . Breast cancer Mother   . Coronary artery disease Father   . Rheum arthritis Father     SOCIAL HX:   reports that she quit smoking about 14 years ago. Her smoking use included cigarettes. She has a 87.75 pack-year smoking history. She has never used smokeless tobacco. She reports that she does not drink alcohol or use drugs.   Current Outpatient Medications:  .  acetaminophen (TYLENOL ARTHRITIS PAIN) 650 MG CR tablet, Take 1,300 mg by mouth 2 (two) times daily. , Disp: , Rfl:  .  albuterol (PROAIR HFA) 108 (90 Base) MCG/ACT inhaler, 2 puffs every 4 hours as needed only  if your can't catch your breath (Patient taking differently: Inhale 2 puffs into the lungs every 4 (four) hours as needed for shortness of breath (if you can't catch your breath). ), Disp: 1 Inhaler, Rfl: 11 .  aspirin 81 MG tablet, Take 1 tablet (81 mg total) by mouth daily., Disp: 30 tablet, Rfl: 0 .  atorvastatin (LIPITOR) 40 MG tablet, Take 1 tablet (40 mg total) by mouth daily  at 6 PM., Disp: 90 tablet, Rfl: 3 .  Cholecalciferol (VITAMIN D3) 2000 UNITS TABS, Take 2,000 Int'l Units by mouth daily., Disp: , Rfl:  .  citalopram (CELEXA) 40 MG tablet, Take 1 tablet (40 mg total) by mouth every morning., Disp: 90 tablet, Rfl: 1 .  doxycycline (VIBRAMYCIN) 100 MG capsule, Take 1 capsule (100 mg total) by mouth 2 (two) times daily for 4 days., Disp: 8 capsule, Rfl: 0 .  furosemide (LASIX) 20 MG tablet, TAKE 1 TABLET BY MOUTH TWO  TIMES DAILY (Patient taking differently: Take 20 mg by mouth 2 (two) times daily. ), Disp: 180 tablet, Rfl: 2 .  golimumab (SIMPONI ARIA) 50 MG/4ML SOLN injection, Inject 50 mg into the vein every 8 (eight) weeks. , Disp: , Rfl:  .  leflunomide (ARAVA) 20 MG tablet, Take 1 tablet (20 mg total) by mouth daily., Disp: , Rfl:  .  levothyroxine (SYNTHROID, LEVOTHROID) 150 MCG tablet, TAKE 1 TABLET BY MOUTH  DAILY BEFORE BREAKFAST (Patient taking differently: Take 150 mcg by mouth daily before breakfast. ), Disp: 90 tablet, Rfl: 2 .  losartan (COZAAR) 50 MG tablet, Take 1 tablet (50 mg total) by mouth daily., Disp: 90 tablet, Rfl: 3 .  montelukast (SINGULAIR) 10 MG tablet, TAKE 1 TABLET BY MOUTH AT  BEDTIME (Patient taking differently: Take 10 mg by mouth at bedtime. ), Disp: 90 tablet, Rfl: 3 .  OXYGEN, Inhale 2 L into the lungs continuous. continuous o2 , Disp: , Rfl:  .  pantoprazole (PROTONIX) 40 MG tablet, TAKE 1 TABLET BY MOUTH  DAILY BEFORE BREAKFAST (Patient taking differently: Take 40 mg by mouth daily. ), Disp: 90 tablet, Rfl: 1 .  predniSONE (DELTASONE) 10 MG tablet, Take 4 tablets (40 mg total) by mouth daily for 3 days, THEN 3 tablets (30 mg total) daily for 3 days, THEN 2 tablets (20 mg total) daily for 3 days, THEN 1 tablet (10 mg total) daily for 3 days., Disp: 30 tablet, Rfl: 0 .  SYMBICORT 160-4.5 MCG/ACT inhaler, USE 2 PUFFS  BY MOUTH TWO  TIMES DAILY (Patient taking differently: Inhale 2 puffs into the lungs 2 (two) times a day. ), Disp: 30.6  g, Rfl: 1 .  tiotropium (SPIRIVA HANDIHALER) 18 MCG inhalation capsule, Place 1 capsule (18 mcg total) into inhaler and inhale daily., Disp: 30 capsule, Rfl: 12 .  traMADol (ULTRAM) 50 MG tablet, TAKE 1-2 TABLETS EVERY 6 HOURS AS NEEDED FOR PAIN (Patient taking differently: Take 50 mg by mouth 2 (two) times daily. ), Disp: 90 tablet, Rfl: 0 .  triamcinolone cream (KENALOG) 0.1 %, Apply 1 application topically 2 (two) times daily as needed (for psoriasis). , Disp: , Rfl:   EXAM:   VITALS per patient if applicable: BP 126/63, HR 65 Temp 98.4, Os sats 95%  GENERAL: alert, oriented, appears well and in no acute distress  HEENT: atraumatic, conjunttiva clear, no obvious abnormalities on inspection of external nose and ears  NECK: normal movements of the head and neck  LUNGS: on inspection no signs of respiratory distress, breathing rate appears normal, no obvious gross increased work of breathing, gasping or wheezing  CV: no obvious cyanosis  MS: moves all visible extremities without noticeable abnormality  PSYCH/NEURO: pleasant and cooperative, no obvious depression or anxiety, speech and thought processing grossly intact  ASSESSMENT AND PLAN:   COPD with acute exacerbation (HCC) -Feels better since DC. -Advised to continue abx and prednisone taper as prescribed. -Will see her back in 6 weeks in-office for follow up.  Acquired hypothyroidism -Her TSH was low at 0.081 while in the hospital. -Will decrease synthroid from 150 to 125 mcg and will need repeat TSH in 4-6 weeks.   Medical decision making of moderate complexity was utilized today.   I discussed the assessment and treatment plan with the patient. The patient was provided an opportunity to ask questions and all were answered. The patient agreed with the plan and demonstrated an understanding of the instructions.   The patient was advised to call back or seek an in-person evaluation if the symptoms worsen or if the condition  fails to improve as anticipated.    Chaya Jan, MD  [facility]

## 2019-03-17 ENCOUNTER — Telehealth: Payer: Self-pay | Admitting: Internal Medicine

## 2019-03-17 NOTE — Telephone Encounter (Signed)
Pt has decided that she will just continue with proventil for now. She received this when discharged from hospital. Nothing further needed.

## 2019-03-17 NOTE — Telephone Encounter (Signed)
LMTCB

## 2019-04-04 DIAGNOSIS — M150 Primary generalized (osteo)arthritis: Secondary | ICD-10-CM | POA: Diagnosis not present

## 2019-04-04 DIAGNOSIS — M0589 Other rheumatoid arthritis with rheumatoid factor of multiple sites: Secondary | ICD-10-CM | POA: Diagnosis not present

## 2019-04-04 DIAGNOSIS — L409 Psoriasis, unspecified: Secondary | ICD-10-CM | POA: Diagnosis not present

## 2019-04-04 DIAGNOSIS — I252 Old myocardial infarction: Secondary | ICD-10-CM | POA: Diagnosis not present

## 2019-04-04 DIAGNOSIS — M8580 Other specified disorders of bone density and structure, unspecified site: Secondary | ICD-10-CM | POA: Diagnosis not present

## 2019-04-04 DIAGNOSIS — Z79899 Other long term (current) drug therapy: Secondary | ICD-10-CM | POA: Diagnosis not present

## 2019-04-05 ENCOUNTER — Telehealth: Payer: Self-pay

## 2019-04-05 DIAGNOSIS — M0589 Other rheumatoid arthritis with rheumatoid factor of multiple sites: Secondary | ICD-10-CM | POA: Diagnosis not present

## 2019-04-05 NOTE — Telephone Encounter (Signed)
PT link text to cell phone and email sent.

## 2019-04-05 NOTE — Telephone Encounter (Signed)
I called pt that her appt tomorrow will be change to video due to COVID 19. Pt gave verbal consent to do video and to file insurance. Pt did a video visit with her other provider and knows process. I explain to click link 10 minutes prior and type first and last name. Pt has a smart phone with [employer] as carrier and lap top. PT verbalized understanding.

## 2019-04-06 ENCOUNTER — Other Ambulatory Visit: Payer: Self-pay

## 2019-04-06 ENCOUNTER — Ambulatory Visit (INDEPENDENT_AMBULATORY_CARE_PROVIDER_SITE_OTHER): Payer: Medicare Other | Admitting: Adult Health

## 2019-04-06 DIAGNOSIS — M542 Cervicalgia: Secondary | ICD-10-CM | POA: Diagnosis not present

## 2019-04-06 DIAGNOSIS — I10 Essential (primary) hypertension: Secondary | ICD-10-CM | POA: Diagnosis not present

## 2019-04-06 DIAGNOSIS — E785 Hyperlipidemia, unspecified: Secondary | ICD-10-CM

## 2019-04-06 DIAGNOSIS — I610 Nontraumatic intracerebral hemorrhage in hemisphere, subcortical: Secondary | ICD-10-CM

## 2019-04-06 NOTE — Progress Notes (Signed)
[facility] [address] [place]. [place] [address] [phone]       FOLLOW UP NOTE  [name] Date of Birth:  11-22-1945 Medical Record Number:  [id number]   Reason for visit: Stroke follow up  Virtual Visit via Video Note  I connected with [name] on 04/06/19 at  2:45 PM EDT by a video enabled telemedicine application located remotely in my own home and verified that I am speaking with the correct person using two identifiers who was located at their own home.  Initially scheduled today for office follow-up visit but due to COVID-19 safety precautions, visit transition to telemedicine via [email or web address] with patient's consent.   I discussed the limitations of evaluation and management by telemedicine and the availability of in person appointments. The patient expressed understanding and agreed to proceed.    HPI: 04/06/19 VIRTUAL VISIT Stable from stroke standpoint. Continues on aspirin without bleeding or bruising. Continues on lipitor without myalgias. BP not recently routinely monitored as it had been stable.  She did undergo repeat CT head on 11/05/2018 showed resolution of prior right parietal intercerebral hemorrhage.   Concerns today is regarding left posterior neck pain which will cause left-sided orbital headache.  Pain is located in left sided musculature region which causes pain down into her shoulder but otherwise denies any other radiating pain.  Denies any radiculopathy or any other neurological symptom associated with pain.  Denies any traumatic event.  Orbital headache mild with pressure like sensation and typically resolves after taking tramadol (prescribed for arthritis).  She will also use massage and moist heat with benefit.  She will typically experience neck pain and then headache will start.  She has been experiencing this pain for the past 2 months.  She also endorses "cracking" sensation with neck movements.  She does have  underlying history of arthritis and follows with rheumatology.  Approximately 6 months ago, she started new infusion regimen every 8 weeks and recently started on supplement medication as she continues to have pain on infusion alone.      History summary: Ms.Amber Edwards Collinsis a 73 y.o.femalewith history of CHF, bradycardia, COPD, HTN, HLD, obesity, NSTEMI, CAD s/p CABG, RA, PFadmitted for confusion.  CT head reviewed and showed acute hemorrhage posterior to the right splenium of corpus callosum measuring up to 2.4 cm with mild associated edema and mass-effect.  No tPA given due toICH.MRI w/wo contrast again showed right CC splenium ICH without evidence of infarct or tumor.  CTA head and neck was negative for AVM or aneurysm.  Repeat CT head showed stable ICH.  2D echo showed an EF of 65 to 70%.  LDL 52 and A1c 5.3.  Patient was on aspirin 81 mg UTA and recommended no antithrombotic at this time with repeat CTA in 2 to 3 weeks time and consider resuming aspirin if blood absorbed.  Patient was initially found to be in hypertensive emergency which stabilized throughout admission with use of Cleviprex and resumed home blood pressure medications with long-term BP goal normotensive range.  As LDL less than 70, recommended to decrease atorvastatin from 80 mg to 40 mg.  As no specific cause of bleeding was found, it was felt as though it was likely related to increased BP therefore at home dose of losartan was increased.  Patient is being seen today for hospital follow up and accompanied by daughter. She does state at times it takes her to remember certain things or takes her longer to  remember. She does endorse "senior moments" periodically even prior to her ICH. Denies headaches. She feels as though she doesn't sleep well where she has to take melatonin at night but does feel consistent fatigue during the night. She does endorse depression that has been present since her husband passed away a few years  ago. She is on Celexa which she has been on for "years".She does endorse worsening depression and decreased energy for the past 6 months.  She is considering to schedule appointment with psychology for management.  She is unsure if she has ever had sleep apnea testing. She does state that she wears oxygen 24/7 2L [place] unless exerting and then will increase to 3L. She believes overall she is doing well from a stroke standpoint. Blood pressure 146/82. She does monitor at home and typically SBP 120-140.  No further concerns at this time.  Denies new or worsening stroke/TIA symptoms.   ROS:   14 system review of systems performed and negative with exception of shortness of breath, neck pain, headache  PMH:  Past Medical History:  Diagnosis Date   Acute bronchitis 09/25/2015   ACUTE ON CHRONIC DIASTOLIC HEART FAILURE 12/04/2010   Qualifier: Diagnosis of  By: Ladona Ridgelaylor, MD, Jerrell MylarFACC, Gregg William    Arthritis    OA RIGHT KNEE WITH PAIN   Barrett esophagus    Bradycardia 06/01/2015   CHF (congestive heart failure) (HCC)    Chronic respiratory failure (HCC)    Chronic respiratory failure with hypoxia and hypercapnia (HCC) 02/04/2010   Followed in Pulmonary clinic/ [facility]       - 02 dependent  since 07/02/10 >>  83% RA December 05, 2010       - ONO RA 08/05/12  :  Positive sat < 89 x 2:3010m> repeat on 2lpm rec 08/12/2012  - 06/17/2013 reported desat with activity p Knee surgery > rec restart 2lpm with activity  - 06/27/2013   Walked 2lpm  x one lap @ 185 stopped due to sat 88% not sob , desat to 82% on RA just at th   COPD (chronic obstructive pulmonary disease) (HCC)    COPD III spirometry if use FEV1/VC p saba  07/18/2010   Quit smoking May 2006       - PFT's  04/12/10 FEV1  1.21 (69%) ratio 77 and no change p B2,  DLC0 56%   VC 70%         - PFTs  08/08/2013 FEV1 1.21 (60%) ratio 86 and no change p B2 DLCO 79%  VC 72%  On symbicort 160 2bid  - PFT's  02/08/2018  FEV1 0.70 (40 % ) ratio 56 if use  FEV1/VC  p 38 % improvement from saba p symb 160 prior to study with DLCO  78 % corrects to 147  % for alv volume   - 02/08/2018   Cough variant asthma 02/26/2011   Followed in Pulmonary clinic/ [facility]  - PFT's  06/04/15  FEV1 1.20 (67 % ) ratio 83  p 6 % improvement from saba with DLCO  80 % corrects to 132 % for alv volume      - Clinical dx based on response to symbicort       FENO 09/16/2016  =   96 on symbicort 160 2bid > added singulair  Allergy profile 09/16/2016 >  Eos 0.5 /  IgE  78 neg RAST  -  Referred to rehab 04/29/2017 > completed   DOE (  dyspnea on exertion) 02/19/2016   Essential hypertension 04/20/2007   Qualifier: Diagnosis of  By: Marcelyn Ditty RN, Katy Fitch    GERD (gastroesophageal reflux disease)    History of ARDS 2006   History of home oxygen therapy    AT NIGHT WHEN SLEEPING 2 L / MIN NASAL CANNULA   Hyperlipidemia 07/12/2015   Hypertension    Hypothyroidism    Morbid (severe) obesity due to excess calories (HCC) 04/22/2015   pfts with erv 14% 06/04/15  And 33% 02/08/2018    NSTEMI (non-ST elevated myocardial infarction) (HCC) 05/31/2015   Pneumococcal pneumonia (HCC) 2006   HOSPITALIZED AND DEVELOPED ARDS   Psoriatic arthritis (HCC)    Pulmonary fibrosis (HCC)    PULMONARY FIBROSIS ILD POST INFLAMMATORY CHRONIC 07/18/2010   Followed as Primary Care Patient/ [facility]  -s/p ARDS 2006 with bacteremic S  Pna       - CT chest 07/03/10 Nonspecific PF mostly upper lobes       - CT chest 12/03/10 acute gg changes and effusions c/w chf - PFT's  02/08/2018  FVC 0.64 (28 %)   with DLCO  78 % corrects to 147 % for alv volume      Rheumatic disease    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   SOB (shortness of breath) on exertion    Stroke (HCC)     PSH:  Past Surgical History:  Procedure Laterality Date   ABDOMINOPLASTY     CARDIAC CATHETERIZATION N/A 06/01/2015   Procedure: Left Heart Cath and Coronary Angiography;  Surgeon: Runell Gess, MD;  Location: [facility]  INVASIVE CV LAB;  Service: Cardiovascular;  Laterality: N/A;   CARPAL TUNNEL RELEASE     CHOLECYSTECTOMY     CORONARY ARTERY BYPASS GRAFT N/A 06/04/2015   Procedure: CORONARY ARTERY BYPASS GRAFT times three            with left internal mammary artery and right leg saphenous vein;  Surgeon: Alleen Borne, MD;  Location: [facility] OR;  Service: Open Heart Surgery;  Laterality: N/A;   cosmetic breast surgery     JOINT REPLACEMENT     KNEE ARTHROSCOPY Left    TEE WITHOUT CARDIOVERSION  06/04/2015   Procedure: TRANSESOPHAGEAL ECHOCARDIOGRAM (TEE);  Surgeon: Alleen Borne, MD;  Location[facility] OR;  Service: Open Heart Surgery;;   TOTAL KNEE ARTHROPLASTY Left    TOTAL KNEE ARTHROPLASTY Right 06/06/2013   Procedure: RIGHT TOTAL KNEE ARTHROPLASTY;  Surgeon: Loanne Drilling, MD;  Location: [facility] ORS;  Service: Orthopedics;  Laterality: Right;    Social History:  Social History   Socioeconomic History   Marital status: Widowed    Spouse name: Not on file   Number of children: Not on file   Years of education: Not on file   Highest education level: Not on file  Occupational History   Occupation: Nurse, 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, jewellery  Social Needs   Financial resource strain: Not on file   Food insecurity:    Worry: Not on file    Inability: Not on file   Transportation needs:    Medical: Not on file    Non-medical: Not on file  Tobacco Use   Smoking status: Former Smoker    Packs/day: 1.50    Years: 58.50    Pack years: 87.75    Types: Cigarettes    Last attempt to quit: 03/03/2005    Years since quitting: 14.1   Smokeless tobacco: Never Used  Substance and Sexual Activity   Alcohol use: No   Drug use: No   Sexual activity: Not on file  Lifestyle   Physical activity:    Days per week: Not on file    Minutes per session: Not on file   Stress: Not on file  Relationships   Social connections:    Talks on phone: Not on file    Gets together: Not on file    Attends  religious service: Not on file    Active member of club or organization: Not on file    Attends meetings of clubs or organizations: Not on file    Relationship status: Not on file   Intimate partner violence:    Fear of current or ex partner: Not on file    Emotionally abused: Not on file    Physically abused: Not on file    Forced sexual activity: Not on file  Other Topics Concern   Not on file  Social History Narrative   Not on file    Family History:  Family History  Problem Relation Age of Onset   Breast cancer Mother    Coronary artery disease Father    Rheum arthritis Father     Medications:   Current Outpatient Medications on File Prior to Visit  Medication Sig Dispense Refill   acetaminophen (TYLENOL ARTHRITIS PAIN) 650 MG CR tablet Take 1,300 mg by mouth 2 (two) times daily.      albuterol (PROAIR HFA) 108 (90 Base) MCG/ACT inhaler 2 puffs every 4 hours as needed only  if your can't catch your breath (Patient taking differently: Inhale 2 puffs into the lungs every 4 (four) hours as needed for shortness of breath (if you can't catch your breath). ) 1 Inhaler 11   aspirin 81 MG tablet Take 1 tablet (81 mg total) by mouth daily. 30 tablet 0   atorvastatin (LIPITOR) 40 MG tablet Take 1 tablet (40 mg total) by mouth daily at 6 PM. 90 tablet 3   Cholecalciferol (VITAMIN D3) 2000 UNITS TABS Take 2,000 Int'l Units by mouth daily.     citalopram (CELEXA) 40 MG tablet Take 1 tablet (40 mg total) by mouth every morning. 90 tablet 1   furosemide (LASIX) 20 MG tablet TAKE 1 TABLET BY MOUTH TWO  TIMES DAILY (Patient taking differently: Take 20 mg by mouth 2 (two) times daily. ) 180 tablet 2   golimumab (SIMPONI ARIA) 50 MG/4ML SOLN injection Inject 50 mg into the vein every 8 (eight) weeks.      leflunomide (ARAVA) 20 MG tablet Take 1 tablet (20 mg total) by mouth daily.     levothyroxine (SYNTHROID) 125 MCG tablet Take 1 tablet (125 mcg total) by mouth daily. 90 tablet  3   losartan (COZAAR) 50 MG tablet Take 1 tablet (50 mg total) by mouth daily. 90 tablet 3   montelukast (SINGULAIR) 10 MG tablet TAKE 1 TABLET BY MOUTH AT  BEDTIME (Patient taking differently: Take 10 mg by mouth at bedtime. ) 90 tablet 3   OXYGEN Inhale 2 L into the lungs continuous. continuous o2      pantoprazole (PROTONIX) 40 MG tablet TAKE 1 TABLET BY MOUTH  DAILY BEFORE BREAKFAST (Patient taking differently: Take 40 mg by mouth daily. ) 90 tablet 1   SYMBICORT 160-4.5 MCG/ACT inhaler USE 2 PUFFS BY MOUTH TWO  TIMES DAILY (Patient taking differently: Inhale 2 puffs into the lungs 2 (two) times a day. ) 30.6 g 1  tiotropium (SPIRIVA HANDIHALER) 18 MCG inhalation capsule Place 1 capsule (18 mcg total) into inhaler and inhale daily. 30 capsule 12   traMADol (ULTRAM) 50 MG tablet TAKE 1-2 TABLETS EVERY 6 HOURS AS NEEDED FOR PAIN (Patient taking differently: Take 50 mg by mouth 2 (two) times daily. ) 90 tablet 0   triamcinolone cream (KENALOG) 0.1 % Apply 1 application topically 2 (two) times daily as needed (for psoriasis).      No current facility-administered medications on file prior to visit.     Allergies:  No Known Allergies   Physical Exam  General: Obese pleasant elderly Caucasian female, seated, in no evident distress  Head: head normocephalic and atraumatic.   Musculoskeletal: Increased pain with head rotation towards right.  Full neck ROM  Neurologic Exam Mental Status: Awake and fully alert. Oriented to place and time. Recent and remote memory intact. Attention span, concentration and fund of knowledge appropriate. Mood and affect appropriate.  Cranial Nerves: Extraocular movements full without nystagmus. Hearing intact to voice. Facial sensation intact. Face, tongue, palate moves normally and symmetrically.  Motor: No evidence of weakness per drift assessment Sensory.: intact to light touch Coordination: Rapid alternating movements normal in all extremities.  Finger-to-nose and heel-to-shin performed accurately bilaterally. Gait and Station: Arises from chair without difficulty. Stance is normal. Gait demonstrates normal stride length and balance.  Reflexes: UTA      Diagnostic Data (Labs, Imaging, Testing)  CT HEAD WO CONTRAST 08/25/2018 IMPRESSION: 1. Acute hemorrhage posterior to the right splenium of corpus callosum measuring up to 2.4 cm, 2 cc. Mild associated edema and mass effect. 2. No additional acute intracranial abnormality identified. 3. Mild chronic microvascular ischemic changes and volume loss of the brain for age.  CT ANGIO HEAD W OR WO CONTRAST CT ANGIO NECK W OR WO CONTRAST 08/26/2018 IMPRESSION: 1. Stable hemorrhage posterior to the right splenium of corpus callosum. No abnormal enhancement, vascular malformation, or dural venous sinus thrombosis identified. 2. Patent carotid and vertebral arteries. No dissection, aneurysm, or hemodynamically significant stenosis utilizing NASCET criteria. 3. Patent anterior and posterior intracranial circulation. No large vessel occlusion, aneurysm, or significant stenosis. 4. Calcific atherosclerosis of the aorta and carotid systems with mild left paraclinoid ICA stenosis. 5. Mild-to-moderate left P1 stenosis.  MR BRAIN WO CONTRAST 08/26/2018 IMPRESSION: 1. Stable late acute/early subacute hemorrhage posterior to the right splenium of corpus callosum. Stable small area of surrounding edema and local mass effect. 2. No new acute intracranial abnormality identified. No abnormal enhancement of the brain. 3. Mild for age chronic microvascular ischemic changes and volume loss of the brain.  ECHOCARDIOGRAM 08/26/2018 Study Conclusions - Left ventricle: The cavity size was normal. There was mild   concentric hypertrophy. Systolic function was vigorous. The   estimated ejection fraction was in the range of 65% to 70%. Wall   motion was normal; there were no regional wall  motion   abnormalities. Doppler parameters are consistent with abnormal   left ventricular relaxation (grade 1 diastolic dysfunction). - Mitral valve: Calcified annulus. Valve area by pressure   half-time: 2.18 cm^2. - Left atrium: The atrium was mildly dilated.   ASSESSMENT: [name] is a 73 y.o. year old female here with right CC splenium ICH on 08/25/2018 secondary to unclear etiology but possibly due to hypertension. Vascular risk factors include CHF, bradycardia, COPD, HTN, HLD, obesity, NSTEMI, CAD status post CABG and PF.  She has been stable from a stroke standpoint.  Greatest concern today is left-sided neck pain  without radiculopathy causing left orbital headache likely related to arthritis versus muscle strain.    PLAN:  1. ICH : Continue atorvastatin 40 mg for secondary stroke prevention.  Avoid aspirin, aspirin-containing products and ibuprofen products.  Maintain strict control of hypertension with blood pressure goal below 130/90, diabetes with hemoglobin A1c goal below 6.5% and cholesterol with LDL cholesterol (bad cholesterol) goal below 70 mg/dL.  I also advised the patient to eat a healthy diet with plenty of whole grains, cereals, fruits and vegetables, exercise regularly with at least 30 minutes of continuous activity daily and maintain ideal body weight.  Order placed to repeat CT head and if shows resolution of prior bleed, will consider restarting aspirin at that time for secondary stroke prevention 2. HTN: Advised to continue current treatment regimen. Advised to continue to monitor at home along with continued follow-up with PCP for management 3. HLD: Advised to continue current treatment regimen along with continued follow-up with PCP for future prescribing and monitoring of lipid panel 4. Musculoskeletal neck pain: Due to reported symptoms, likely related to arthritis versus muscle strain.  Neck stretches provided via my chart and recommended ongoing use of  moist heat and Tylenol as needed.  Consider PT if needed.  Recommend ongoing follow-up with rheumatologist for ongoing arthritis management.  No indication at this time for imaging.   Follow up in 6 months or call earlier if needed   Greater than 50% of time during this 25 minute non-face-to-face visit was spent on counseling, explanation of diagnosis of ICH, reviewing risk factor management of HTN, HLD, CHF, COPD and depression, discussion regarding neck pain likely related to arthritis, planning of further management along with potential future management, and discussion with patient and family answering all questions.    George Hugh, AGNP-BC  [facility] [address] [place], [place] [address]  Phone [phone] Fax [phone] Note: This document was prepared with digital dictation and possible smart phrase technology. Any transcriptional errors that result from this process are unintentional.

## 2019-04-07 ENCOUNTER — Encounter: Payer: Self-pay | Admitting: Adult Health

## 2019-04-08 ENCOUNTER — Other Ambulatory Visit: Payer: Self-pay

## 2019-04-08 ENCOUNTER — Ambulatory Visit (INDEPENDENT_AMBULATORY_CARE_PROVIDER_SITE_OTHER): Payer: Medicare Other

## 2019-04-08 ENCOUNTER — Encounter: Payer: Self-pay | Admitting: Nurse Practitioner

## 2019-04-08 ENCOUNTER — Telehealth: Payer: Self-pay | Admitting: Internal Medicine

## 2019-04-08 ENCOUNTER — Ambulatory Visit (INDEPENDENT_AMBULATORY_CARE_PROVIDER_SITE_OTHER): Payer: Medicare Other | Admitting: Nurse Practitioner

## 2019-04-08 DIAGNOSIS — J441 Chronic obstructive pulmonary disease with (acute) exacerbation: Secondary | ICD-10-CM | POA: Diagnosis not present

## 2019-04-08 DIAGNOSIS — R0602 Shortness of breath: Secondary | ICD-10-CM

## 2019-04-08 MED ORDER — DOXYCYCLINE HYCLATE 100 MG PO TABS: 100.0000 mg | ORAL_TABLET | Freq: Two times a day (BID) | ORAL | 0 refills | Status: DC

## 2019-04-08 MED ORDER — PREDNISONE 10 MG PO TABS: ORAL_TABLET | ORAL | 0 refills | Status: DC

## 2019-04-08 NOTE — Telephone Encounter (Signed)
lmtcb x1 pt 

## 2019-04-08 NOTE — Telephone Encounter (Signed)
Spoke with pt and she stated she is having left sided pain in the back where her lungs are for 3-4 days.  Mychart visit scheduled with TN at 230.

## 2019-04-08 NOTE — Progress Notes (Signed)
I agree with the above plan 

## 2019-04-08 NOTE — Telephone Encounter (Signed)
Pt returning call and can be reached @ [phone].Caren Griffins

## 2019-04-08 NOTE — Progress Notes (Signed)
Virtual Visit via Telephone Note  I connected with [name] on 04/08/19 at  2:30 PM EDT by telephone and verified that I am speaking with the correct person using two identifiers.  Location: Patient: home Provider: office   I discussed the limitations, risks, security and privacy concerns of performing an evaluation and management service by telephone and the availability of in person appointments. I also discussed with the patient that there may be a patient responsible charge related to this service. The patient expressed understanding and agreed to proceed.   History of Present Illness: 73 year old female former smoker with COPD III and chronic respiratory failure followed by Dr. Sherene Sires.  Patient has a tele-visit today for an acute visit.  She states for the past the past 3 days she has been having bilateral lower back pain.  She has had some increased shortness of breath and wheezing.  She is concerned because she states that this feels the same way it did the last time she had pneumonia and was hospitalized.  She states she has been a little bit more fatigued than usual.  She is requesting a chest x-ray today.  Patient states she has had a cough that is nonproductive.  He has been compliant with Singulair and uses pro-air as needed. Denies f/c/s, n/v/d, hemoptysis, PND, leg swelling.    Observations/Objective: Quit smoking May 2006       - PFT's  04/12/10 FEV1  1.21 (69%) ratio 77 and no change p B2,  DLC0 56%   VC 70%         - PFTs  08/08/2013 FEV1 1.21 (60%) ratio 86 and no change p B2 DLCO 79%  VC 72%  On symbicort 160 2bid  - PFT's  02/08/2018  FEV1 0.70 (40 % ) ratio 56 if use FEV1/VC  p 38 % improvement from saba p symb 160 prior to study with DLCO  78 % corrects to 147  % for alv volume   - 02/08/2018  After extensive coaching inhaler device  effectiveness =    75% with mdi and 90% with dpi so change to Trelegy > no change from sample so rec resume symb 160 2 bid as of  03/24/2018    - 10/11/2018  After extensive coaching inhaler device,  effectiveness =    75% (short ti)  CTA 03/10/19 - No pulmonary emboli. Nearly completely resolved pulmonary infiltrates seen in December, perhaps with mild residual scarring.  Assessment and Plan: COPD Exacerbation: Patient states for the past the past 3 days she has been having bilateral lower back pain.  She has had some increased shortness of breath and wheezing.  She is concerned because she states that this feels the same way it did the last time she had pneumonia and was hospitalized.  She states she has been a little bit more fatigued than usual.  She is requesting a chest x-ray today.  Patient states she has had a cough that is nonproductive.  She has been compliant with Singulair and uses pro-air as needed.   Plan: Patient Instructions  Will order chest x ray and call with results Continue Spiriva Continue symbicort Continue proair Will order doxycycline Will order prednisone taper     Follow Up Instructions: Follow up: Follow up with Dr. Sherene Sires in 2 weeks Please go to the ED if symptoms worsen over the weekend    I discussed the assessment and treatment plan with the patient. The patient was provided an opportunity to ask  questions and all were answered. The patient agreed with the plan and demonstrated an understanding of the instructions.   The patient was advised to call back or seek an in-person evaluation if the symptoms worsen or if the condition fails to improve as anticipated.  I provided 22 minutes of non-face-to-face time during this encounter.   Ivonne Andrew, NP

## 2019-04-08 NOTE — Assessment & Plan Note (Signed)
COPD Exacerbation: Patient states for the past the past 3 days she has been having bilateral lower back pain.  She has had some increased shortness of breath and wheezing.  She is concerned because she states that this feels the same way it did the last time she had pneumonia and was hospitalized.  She states she has been a little bit more fatigued than usual.  She is requesting a chest x-ray today.  Patient states she has had a cough that is nonproductive.  She has been compliant with Singulair and uses pro-air as needed.   Plan: Patient Instructions  Will order chest x ray and call with results Continue Spiriva Continue symbicort Continue proair Will order doxycycline Will order prednisone taper  Follow up: Follow up with Dr. Sherene Sires in 2 weeks Please go to the ED if symptoms worsen over the weekend

## 2019-04-08 NOTE — Patient Instructions (Addendum)
Will order chest x ray and call with results Continue Spiriva Continue symbicort Continue proair Will order doxycycline Will order prednisone taper  Follow up: Follow up with Dr. Sherene Sires in 2 weeks Please go to the ED if symptoms worsen over the weekend

## 2019-04-13 NOTE — Progress Notes (Signed)
Chart and office note reviewed in detail  > agree with a/p as outlined    

## 2019-04-14 ENCOUNTER — Other Ambulatory Visit: Payer: Self-pay | Admitting: Internal Medicine

## 2019-04-18 ENCOUNTER — Other Ambulatory Visit: Payer: Self-pay

## 2019-04-18 ENCOUNTER — Encounter: Payer: Self-pay | Admitting: Internal Medicine

## 2019-04-18 ENCOUNTER — Ambulatory Visit (INDEPENDENT_AMBULATORY_CARE_PROVIDER_SITE_OTHER): Payer: Medicare Other | Admitting: Internal Medicine

## 2019-04-18 DIAGNOSIS — J9611 Chronic respiratory failure with hypoxia: Secondary | ICD-10-CM

## 2019-04-18 DIAGNOSIS — J449 Chronic obstructive pulmonary disease, unspecified: Secondary | ICD-10-CM

## 2019-04-18 DIAGNOSIS — J8410 Pulmonary fibrosis, unspecified: Secondary | ICD-10-CM | POA: Diagnosis not present

## 2019-04-18 DIAGNOSIS — J9612 Chronic respiratory failure with hypercapnia: Secondary | ICD-10-CM | POA: Diagnosis not present

## 2019-04-18 MED ORDER — SPIRIVA RESPIMAT 2.5 MCG/ACT IN AERS: INHALATION_SPRAY | RESPIRATORY_TRACT | 11 refills | Status: DC

## 2019-04-18 MED ORDER — SPIRIVA RESPIMAT 2.5 MCG/ACT IN AERS: 2.0000 | INHALATION_SPRAY | Freq: Every day | RESPIRATORY_TRACT | 0 refills | Status: DC

## 2019-04-18 NOTE — Patient Instructions (Addendum)
Plan A = Automatic =Symbicort 160 x 2 pffs each am followed by spriiva 2 puffs and 12 hours later symbicort 160 x 2   Work on inhaler technique:  relax and gently blow all the way out then take a nice smooth deep breath back in, triggering the inhaler at same time you start breathing in.  Hold for up to 5 seconds if you can. Blow out thru nose. Rinse and gargle with water when done   Plan B = Backup Only use your albuterol(proventil) inhaler as a rescue medication to be used if you can't catch your breath by resting or doing a relaxed purse lip breathing pattern.  - The less you use it, the better it will work when you need it. - Ok to use the inhaler up to 2 puffs  every 4 hours if you must but call for appointment if use goes up over your usual need - Don't leave home without it !!  (think of it like the spare tire for your car)   Plan C = Crisis - only use your albuterol nebulizer if you first try Plan B and it fails to help > ok to use the nebulizer up to every 4 hours but if start needing it regularly call for immediate appointment   Please schedule a follow up office visit in 6 weeks, call sooner if needed

## 2019-04-18 NOTE — Progress Notes (Signed)
Subjective:   Patient ID: [name], female    DOB: September 07, 1946    MRN: [id number]   Brief patient profile:  48  yowf quit smoking in May 2006 with dx pna/ ards resumed full activity including yardwork at wt 140 and pft's c/w restrictive changes 04/2010 at wt 170 but reported improvement on saba so maintained chronically on symbicort 160  With pfts 02/08/18  c/w GOLD III copd if use FEV1/VC ratio    History of Present Illness  July 18, 2010  1st pulmonary office eval in ER era c/o doe x 6 months progressive indolent onset with copd vs pf.  Ultimately required hosp 8/30-9/1 dx copd/pf ? cause on sulfasalzine and ACE inhib better on 02 and advair but very hoarse with sense of chest congestion and cough with white mucus day > night.  doe x > slow adls.stop advair Start symbicort 160 2 puffs first thing  in am and 2 puffs again in pm about 12 hours later  Work on inhaler technique:   stop benazapril start benicar 40/25  one daily Use 02 sleeping and any activity other than sitting still   04/10/2014 f/u ov/[name] re: restrictive lung dz/ RA/psoriatric/ symbicort 160 2bid  Chief Complaint  Patient presents with  . Follow-up    Pt states was advised to f/u per [facility].  Pt has been having increased SOB- relates to stress from dealing with her spouse's illness. She is using o2 pretty much 24/7.   trying to do more than baseline activity and finds she needs 02 more. rec Work on inhaler technique:   Ok to just use the symbicort 160 2 puffs in am to see what difference if any this makes in your breathing  Change 02 to 2lpm 24/7 and use 3lpm with activity    06/04/15  cabg      11/10/2017  f/u ov/[name] re: AB / pf related to ALI vs RA / MO  Chief Complaint  Patient presents with  . Follow-up    Breathing has been worse over the past 1-2 months. She states she gets winded just walking from her house to her car. She has been using her albuterol inhaler 4 x daily on average.   on 2lpm at rest and  sleeping needed up to 4lpm continous while doing rehab  but only has pulsed to 3lpm at home per [employer] Much better only while on prednisone / poor hfa noted  Doe = MMRC3 = can't walk 100 yards even at a slow pace at a flat grade s stopping due to sob  Even on 02 but not sure about sats as does not check rec Goal is to keep sats above 90%  - call if you want [employer] to re-evaluate your ambulatory needs  Prednisone 10 mg take  4 each am x 2 days,   2 each am x 2 days,  1 each am x 2 days and stop  Work on inhaler technique:      04/05/18  aecopd > pred x 6 days > back to baseline      10/11/2018  f/u ov/[name] re:  COPD GOLD III/ 02 dep / maint on just symb 160 2bid (no better with incruse) Chief Complaint  Patient presents with  . Follow-up    Breathing is the same. She is using her albuterol inhaler once per wk on average.   Dyspnea:  HT, sometimes [employer] on 3lpm MMRC3 = can't walk 100 yards even at a slow pace  at a flat grade s stopping due to sob   Cough: none Sleeping: on back books under hob plus pillows = 30 degrees  SABA use: rare  02: 2lpm but turns to 3lpm poc with activity    rec Prednisone 10 mg take  4 each am x 2 days,   2 each am x 2 days,  1 each am x 2 days and stop  Work on inhaler technique:   Please schedule a follow up visit in 4 months but call sooner if needed  - add consider adding spiriva next ov if not doing better and insurance will cover   04/18/2019  f/u ov/[name] re: GOLD III spirometry but 02 dep/ maint on symb 160 2bid/spiriva  Chief Complaint  Patient presents with  . Follow-up    Breathing not improving since the last visit. She states she has not been active at all. She has been using her albuterol daily.   Dyspnea:  No longer shopping due to covid 19 does delivery only  Cough: occ spells/ non productive never noct   Sleeping: on elevated to 30 degrees  SABA use: daily use of nebulizer  02: 2lpm but 3lpm with activity    No obvious day to day or  daytime variability or assoc excess/ purulent sputum or mucus plugs or hemoptysis or cp or chest tightness, subjective wheeze or overt sinus or hb symptoms.   Sleeping as above without nocturnal  or early am exacerbation  of respiratory  c/o's or need for noct saba. Also denies any obvious fluctuation of symptoms with weather or environmental changes or other aggravating or alleviating factors except as outlined above   No unusual exposure hx or h/o childhood pna/ asthma or knowledge of premature birth.  Current Allergies, Complete Past Medical History, Past Surgical History, Family History, and Social History were reviewed in [employer] record.  ROS  The following are not active complaints unless bolded Hoarseness, sore throat, dysphagia, dental problems, itching, sneezing,  nasal congestion or discharge of excess mucus or purulent secretions, ear ache,   fever, chills, sweats, unintended wt loss or wt gain, classically pleuritic or exertional cp,  orthopnea pnd or arm/hand swelling  or leg swelling, presyncope, palpitations, abdominal pain, anorexia, nausea, vomiting, diarrhea  or change in bowel habits or change in bladder habits, change in stools or change in urine, dysuria, hematuria,  rash, arthralgias, visual complaints, headache, numbness, weakness or ataxia or problems with walking or coordination,  change in mood or  memory.        Current Meds  Medication Sig  . acetaminophen (TYLENOL ARTHRITIS PAIN) 650 MG CR tablet Take 1,300 mg by mouth 2 (two) times daily.   Marland Kitchen albuterol (PROAIR HFA) 108 (90 Base) MCG/ACT inhaler 2 puffs every 4 hours as needed only  if your can't catch your breath (Patient taking differently: Inhale 2 puffs into the lungs every 4 (four) hours as needed for shortness of breath (if you can't catch your breath). )  . aspirin 81 MG tablet Take 1 tablet (81 mg total) by mouth daily.  Marland Kitchen atorvastatin (LIPITOR) 40 MG tablet Take 1 tablet (40 mg total)  by mouth daily at 6 PM.  . Cholecalciferol (VITAMIN D3) 2000 UNITS TABS Take 2,000 Int'l Units by mouth daily.  . citalopram (CELEXA) 40 MG tablet TAKE 1 TABLET BY MOUTH  EVERY MORNING  . furosemide (LASIX) 20 MG tablet TAKE 1 TABLET BY MOUTH TWO  TIMES DAILY (Patient taking differently: Take  20 mg by mouth 2 (two) times daily. )  . golimumab (SIMPONI ARIA) 50 MG/4ML SOLN injection Inject 50 mg into the vein every 8 (eight) weeks.   Marland Kitchen leflunomide (ARAVA) 20 MG tablet Take 1 tablet (20 mg total) by mouth daily.  Marland Kitchen levothyroxine (SYNTHROID) 125 MCG tablet Take 1 tablet (125 mcg total) by mouth daily.  Marland Kitchen losartan (COZAAR) 50 MG tablet Take 1 tablet (50 mg total) by mouth daily.  . montelukast (SINGULAIR) 10 MG tablet TAKE 1 TABLET BY MOUTH AT  BEDTIME (Patient taking differently: Take 10 mg by mouth at bedtime. )  . OXYGEN Inhale 2 L into the lungs continuous. continuous o2   . pantoprazole (PROTONIX) 40 MG tablet TAKE 1 TABLET BY MOUTH  DAILY BEFORE BREAKFAST (Patient taking differently: Take 40 mg by mouth daily. )  . SYMBICORT 160-4.5 MCG/ACT inhaler USE 2 PUFFS BY MOUTH TWO  TIMES DAILY (Patient taking differently: Inhale 2 puffs into the lungs 2 (two) times a day. )  . tiotropium (SPIRIVA HANDIHALER) 18 MCG inhalation capsule Place 1 capsule (18 mcg total) into inhaler and inhale daily.  . traMADol (ULTRAM) 50 MG tablet TAKE 1-2 TABLETS EVERY 6 HOURS AS NEEDED FOR PAIN (Patient taking differently: Take 50 mg by mouth 2 (two) times daily. )  . triamcinolone cream (KENALOG) 0.1 % Apply 1 application topically 2 (two) times daily as needed (for psoriasis).                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       - CT chest 07/03/10 Nonspecific PF mostly upper lobes       - CT chest 12/03/10 acute gg changes and effusions c/w chf        - PFT's  04/12/10 FEV1  1.21 (69%) ratio 77 and no change p B2,  DLC0 56% Asthmatic bronchitis     - HFA 50% p coaching July 18, 2010 >>  90%  02/25/2011  history of mild vitamin D deficiency        Objective:   Physical Exam    amb obese wf nad  Vital signs reviewed - Note on arrival 02 sats  96% on 3lpm pulsed         wt 192 July 18, 2010 > 197 August 21, 2010 > 207 December 05, 2010  > 183  01/21/2011  > 179 02/25/2011 >189 08/10/2012 >195 09/08/12 >200 12/27/12 > 202 05/03/2013 > 06/28/2013  199 > 08/08/13 194 > 04/10/2014  203 > 04/19/2015   176 >  07/23/2015 166 > 09/16/2016  210 >  10/29/2016  209 > 04/29/2017    206 > 11/10/2017   209 > 02/08/2018  207 > 03/24/2018 209> 04/23/2018  208> 10/11/2018   208  > 04/18/2019  203    HEENT: Top denture/ nl  oropharynx. Nl external ear canals without cough reflex -  Mild bilateral non-specific turbinate edema     NECK :  without JVD/Nodes/TM/ nl carotid upstrokes bilaterally   LUNGS: no acc muscle use,  Mild barrel  contour chest wall with bilateral  Distant bs  s audible wheeze and  without cough on insp or exp maneuver and mild  Hyperresonant  to  percussion bilaterally     CV:  RRR  no s3 or murmur or increase in P2, and no edema   ABD:  soft and nontender with pos late  insp Hoover's  in the supine position. No bruits or organomegaly appreciated, bowel sounds nl  MS:   Nl gait/  ext warm without deformities, calf tenderness, cyanosis or clubbing No obvious joint restrictions   SKIN: warm and dry without lesions    NEURO:  alert, approp, nl sensorium with  no motor or cerebellar deficits apparent.

## 2019-04-20 ENCOUNTER — Encounter: Payer: Self-pay | Admitting: Internal Medicine

## 2019-04-20 DIAGNOSIS — J9611 Chronic respiratory failure with hypoxia: Secondary | ICD-10-CM | POA: Insufficient documentation

## 2019-04-20 NOTE — Assessment & Plan Note (Signed)
-   02 dependent  since 07/02/10 >>  83% RA December 05, 2010       - ONO RA 08/05/12  :  Positive sat < 89 x 2:69m> repeat on 2lpm rec 08/12/2012  - 06/17/2013 reported desat with activity p Knee surgery > rec restart 2lpm with activity  - 06/27/2013   Walked 2lpm  x one lap @ 185 stopped due to sat 88% not sob , desat to 82% on RA just at the  Start of the walk  - 04/10/14  Sats  81% RA and then  Walked 3lpm x  2 laps @ 185 ft each stopped due to end of study, no desat  - HCO3  11/09/17     = 31 - HCO3  08/27/18 = 37  C/w hypercarbia component worse     10/11/2018  Reviewed rec:     rec 2lpm at rest/ 3lpm with activity /POC> titrate up to keep > 90%    I had an extended discussion with the patient reviewing all relevant studies completed to date and  lasting 15 to 20 minutes of a 25 minute visit    See device teaching which extended face to face time for this visit.  Each maintenance medication was reviewed in detail including emphasizing most importantly the difference between maintenance and prns and under what circumstances the prns are to be triggered using an action plan format that is not reflected in the computer generated alphabetically organized AVS which I have not found useful in most complex patients, especially with respiratory illnesses  Please see AVS for specific instructions unique to this visit that I personally wrote and verbalized to the the pt in detail and then reviewed with pt  by my nurse highlighting any  changes in therapy recommended at today's visit to their plan of care.

## 2019-04-20 NOTE — Assessment & Plan Note (Signed)
Quit smoking May 2006       - PFT's  04/12/10 FEV1  1.21 (69%) ratio 77 and no change p B2,  DLC0 56%   VC 70%         - PFTs  08/08/2013 FEV1 1.21 (60%) ratio 86 and no change p B2 DLCO 79%  VC 72%  On symbicort 160 2bid  - PFT's  02/08/2018  FEV1 0.70 (40 % ) ratio 56 if use FEV1/VC  p 38 % improvement from saba p symb 160 prior to study with DLCO  78 % corrects to 147  % for alv volume   - 02/08/2018  After extensive coaching inhaler device  effectiveness =    75% with mdi and 90% with dpi so change to Trelegy > no change from sample so rec resume symb 160 2 bid as of 03/24/2018   - 04/18/2019  After extensive coaching inhaler device,  effectiveness =    75% (short ti) with hfa, better with smi > changed spiriva to smi     Group D in terms of symptom/risk and laba/lama/ICS  therefore appropriate rx at this point >>>  Continue symb/spiriva but changed to the Respimat format.

## 2019-04-20 NOTE — Assessment & Plan Note (Signed)
-  s/p ARDS 2006 with bacteremic S  Pna       - CT chest 07/03/10 Nonspecific PF mostly upper lobes       - CT chest 12/03/10 acute gg changes and effusions c/w chf - PFT's  02/08/2018  FVC 0.64 (28 %)   with DLCO  78 % corrects to 147 % for alv volume     No evidence of disease progression at this point.

## 2019-04-20 NOTE — Assessment & Plan Note (Signed)
-   02 dependent  since 07/02/10 >>  83% RA December 05, 2010       - ONO RA 08/05/12  :  Positive sat < 89 x 2:64m> repeat on 2lpm rec 08/12/2012  - 06/17/2013 reported desat with activity p Knee surgery > rec restart 2lpm with activity  - 06/27/2013   Walked 2lpm  x one lap @ 185 stopped due to sat 88% not sob , desat to 82% on RA just at the  Start of the walk  - 04/10/14  Sats  81% RA and then  Walked 3lpm x  2 laps @ 185 ft each stopped due to end of study, no desat  - HCO3  11/09/17     = 31 - HCO3  08/27/18 = 37   - HCO3  03/11/2019 = 33 - see hypercarbic / hypoxemic RF  04/18/2019  Adequate control on present rx, reviewed in detail with pt > no change in rx needed   Reviewed rec: 2lpm at rest/ 3lpm with activity /POC> titrate up to keep > 90%    I had an extended discussion with the patient reviewing all relevant studies completed to date and  lasting 15 to 20 minutes of a 25 minute visit    See device teaching which extended face to face time for this visit.  Each maintenance medication was reviewed in detail including emphasizing most importantly the difference between maintenance and prns and under what circumstances the prns are to be triggered using an action plan format that is not reflected in the computer generated alphabetically organized AVS which I have not found useful in most complex patients, especially with respiratory illnesses  Please see AVS for specific instructions unique to this visit that I personally wrote and verbalized to the the pt in detail and then reviewed with pt  by my nurse highlighting any  changes in therapy recommended at today's visit to their plan of care.

## 2019-04-26 ENCOUNTER — Ambulatory Visit (INDEPENDENT_AMBULATORY_CARE_PROVIDER_SITE_OTHER): Payer: Medicare Other | Admitting: Internal Medicine

## 2019-04-26 ENCOUNTER — Other Ambulatory Visit: Payer: Self-pay

## 2019-04-26 ENCOUNTER — Other Ambulatory Visit: Payer: Self-pay | Admitting: Internal Medicine

## 2019-04-26 DIAGNOSIS — E039 Hypothyroidism, unspecified: Secondary | ICD-10-CM | POA: Diagnosis not present

## 2019-04-26 DIAGNOSIS — J9611 Chronic respiratory failure with hypoxia: Secondary | ICD-10-CM

## 2019-04-26 DIAGNOSIS — J441 Chronic obstructive pulmonary disease with (acute) exacerbation: Secondary | ICD-10-CM

## 2019-04-26 DIAGNOSIS — J449 Chronic obstructive pulmonary disease, unspecified: Secondary | ICD-10-CM | POA: Diagnosis not present

## 2019-04-26 MED ORDER — LEVOTHYROXINE SODIUM 100 MCG PO TABS: 100.0000 ug | ORAL_TABLET | Freq: Every day | ORAL | 1 refills | Status: DC

## 2019-04-26 NOTE — Progress Notes (Signed)
Virtual Visit via Video Note  I connected with [name] on 04/26/19 at  3:00 PM EDT by a video enabled telemedicine application and verified that I am speaking with the correct person using two identifiers.  Location patient: home Location provider: work office Persons participating in the virtual visit: patient, provider  I discussed the limitations of evaluation and management by telemedicine and the availability of in person appointments. The patient expressed understanding and agreed to proceed.   HPI: This is a scheduled follow up visit. She has been doing well after she was hospitalized in May for a COPD exacerbation. Since I last saw her, she had a follow up with her pulmonologist, Dr. Sherene SiresWert who changed her spiriva from the capsules to the MDI. She is having issues with the administration of it, feels like it is not getting into her lungs. In the hospital her TSH was low and was asked to follow up with me about it. She has been taking synthroid 125 mcg. Other than this, no acute complaints today.   ROS: Constitutional: Denies fever, chills, diaphoresis, appetite change and fatigue.  HEENT: Denies photophobia, eye pain, redness, hearing loss, ear pain, congestion, sore throat, rhinorrhea, sneezing, mouth sores, trouble swallowing, neck pain, neck stiffness and tinnitus.   Respiratory: Denies SOB, DOE, cough, chest tightness,  and wheezing.   Cardiovascular: Denies chest pain, palpitations and leg swelling.  Gastrointestinal: Denies nausea, vomiting, abdominal pain, diarrhea, constipation, blood in stool and abdominal distention.  Genitourinary: Denies dysuria, urgency, frequency, hematuria, flank pain and difficulty urinating.  Endocrine: Denies: hot or cold intolerance, sweats, changes in hair or nails, polyuria, polydipsia. Musculoskeletal: Denies myalgias, back pain, joint swelling, arthralgias and gait problem.  Skin: Denies pallor, rash and wound.  Neurological:  Denies dizziness, seizures, syncope, weakness, light-headedness, numbness and headaches.  Hematological: Denies adenopathy. Easy bruising, personal or family bleeding history  Psychiatric/Behavioral: Denies suicidal ideation, mood changes, confusion, nervousness, sleep disturbance and agitation   Past Medical History:  Diagnosis Date  . Acute bronchitis 09/25/2015  . ACUTE ON CHRONIC DIASTOLIC HEART FAILURE 12/04/2010   Qualifier: Diagnosis of  By: Ladona Ridgelaylor, MD, [facility], Vergia AlconGregg William   . Arthritis    OA RIGHT KNEE WITH PAIN  . Barrett esophagus   . Bradycardia 06/01/2015  . CHF (congestive heart failure) (HCC)   . Chronic respiratory failure (HCC)   . Chronic respiratory failure with hypoxia and hypercapnia (HCC) 02/04/2010   Followed in Pulmonary clinic/ [facility]/ [facility]       - 02 dependent  since 07/02/10 >>  83% RA December 05, 2010       - ONO RA 08/05/12  :  Positive sat < 89 x 2:64102m> repeat on 2lpm rec 08/12/2012  - 06/17/2013 reported desat with activity p Knee surgery > rec restart 2lpm with activity  - 06/27/2013   Walked 2lpm  x one lap @ 185 stopped due to sat 88% not sob , desat to 82% on RA just at th  . COPD (chronic obstructive pulmonary disease) (HCC)   . COPD III spirometry if use FEV1/VC p saba  07/18/2010   Quit smoking May 2006       - PFT's  04/12/10 FEV1  1.21 (69%) ratio 77 and no change p B2,  DLC0 56%   VC 70%         - PFTs  08/08/2013 FEV1 1.21 (60%) ratio 86 and no change p B2 DLCO 79%  VC 72%  On  symbicort 160 2bid  - PFT's  02/08/2018  FEV1 0.70 (40 % ) ratio 56 if use FEV1/VC  p 38 % improvement from saba p symb 160 prior to study with DLCO  78 % corrects to 147  % for alv volume   - 02/08/2018  . Cough variant asthma 02/26/2011   Followed in Pulmonary clinic/ [facility]/ [facility]  - PFT's  06/04/15  FEV1 1.20 (67 % ) ratio 83  p 6 % improvement from saba with DLCO  80 % corrects to 132 % for alv volume      - Clinical dx based on response to symbicort       FENO 09/16/2016   =   96 on symbicort 160 2bid > added singulair  Allergy profile 09/16/2016 >  Eos 0.5 /  IgE  78 neg RAST  -  Referred to rehab 04/29/2017 > completed  . DOE (dyspnea on exertion) 02/19/2016  . Essential hypertension 04/20/2007   Qualifier: Diagnosis of  By: Paulina Fusi RN, Daine Gravel   . GERD (gastroesophageal reflux disease)   . History of ARDS 2006  . History of home oxygen therapy    AT NIGHT WHEN SLEEPING 2 L / MIN NASAL CANNULA  . Hyperlipidemia 07/12/2015  . Hypertension   . Hypothyroidism   . Morbid (severe) obesity due to excess calories ([place]) 04/22/2015   pfts with erv 14% 06/04/15  And 33% 02/08/2018   . NSTEMI (non-ST elevated myocardial infarction) ([place]) 05/31/2015  . Pneumococcal pneumonia ([place]) 2006   HOSPITALIZED AND DEVELOPED ARDS  . Psoriatic arthritis ([place])   . Pulmonary fibrosis ([place])   . PULMONARY FIBROSIS ILD POST INFLAMMATORY CHRONIC 07/18/2010   Followed as Primary Care Patient/ [facility]/ [facility]  -s/p ARDS 2006 with bacteremic S  Pna       - CT chest 07/03/10 Nonspecific PF mostly upper lobes       - CT chest 12/03/10 acute gg changes and effusions c/w chf - PFT's  02/08/2018  FVC 0.64 (28 %)   with DLCO  78 % corrects to 147 % for alv volume     . Rheumatic disease   . 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 SOB (shortness of breath) on exertion   . Stroke [facility])     Past Surgical History:  Procedure Laterality Date  . ABDOMINOPLASTY    . CARDIAC CATHETERIZATION N/A 06/01/2015   Procedure: Left Heart Cath and Coronary Angiography;  Surgeon: Lorretta Harp, MD;  Location: [place] CV LAB;  Service: Cardiovascular;  Laterality: N/A;  . CARPAL TUNNEL RELEASE    . CHOLECYSTECTOMY    . CORONARY ARTERY BYPASS GRAFT N/A 06/04/2015   Procedure: CORONARY ARTERY BYPASS GRAFT times three            with left internal mammary artery and right leg saphenous vein;  Surgeon: Gaye Pollack, MD;  Location: [place] OR;  Service: Open Heart Surgery;  Laterality: N/A;  . cosmetic breast surgery    . JOINT  REPLACEMENT    . KNEE ARTHROSCOPY Left   . TEE WITHOUT CARDIOVERSION  06/04/2015   Procedure: TRANSESOPHAGEAL ECHOCARDIOGRAM (TEE);  Surgeon: Gaye Pollack, MD;  Location: [facility];  Service: Open Heart Surgery;;  . TOTAL KNEE ARTHROPLASTY Left   . TOTAL KNEE ARTHROPLASTY Right 06/06/2013   Procedure: RIGHT TOTAL KNEE ARTHROPLASTY;  Surgeon: Gearlean Alf, MD;  Location: [facility] ORS;  Service: Orthopedics;  Laterality: Right;    Family History  Problem Relation  Age of Onset  . Breast cancer Mother   . Coronary artery disease Father   . Rheum arthritis Father     SOCIAL HX:   reports that she quit smoking about 14 years ago. Her smoking use included cigarettes. She has a 87.75 pack-year smoking history. She has never used smokeless tobacco. She reports that she does not drink alcohol or use drugs.   Current Outpatient Medications:  .  acetaminophen (TYLENOL ARTHRITIS PAIN) 650 MG CR tablet, Take 1,300 mg by mouth 2 (two) times daily. , Disp: , Rfl:  .  albuterol (PROAIR HFA) 108 (90 Base) MCG/ACT inhaler, 2 puffs every 4 hours as needed only  if your can't catch your breath (Patient taking differently: Inhale 2 puffs into the lungs every 4 (four) hours as needed for shortness of breath (if you can't catch your breath). ), Disp: 1 Inhaler, Rfl: 11 .  aspirin 81 MG tablet, Take 1 tablet (81 mg total) by mouth daily., Disp: 30 tablet, Rfl: 0 .  atorvastatin (LIPITOR) 40 MG tablet, Take 1 tablet (40 mg total) by mouth daily at 6 PM., Disp: 90 tablet, Rfl: 3 .  Cholecalciferol (VITAMIN D3) 2000 UNITS TABS, Take 2,000 Int'l Units by mouth daily., Disp: , Rfl:  .  citalopram (CELEXA) 40 MG tablet, TAKE 1 TABLET BY MOUTH  EVERY MORNING, Disp: 90 tablet, Rfl: 1 .  furosemide (LASIX) 20 MG tablet, TAKE 1 TABLET BY MOUTH TWO  TIMES DAILY (Patient taking differently: Take 20 mg by mouth 2 (two) times daily. ), Disp: 180 tablet, Rfl: 2 .  golimumab (SIMPONI ARIA) 50 MG/4ML SOLN injection, Inject 50 mg into the  vein every 8 (eight) weeks. , Disp: , Rfl:  .  leflunomide (ARAVA) 20 MG tablet, Take 1 tablet (20 mg total) by mouth daily., Disp: , Rfl:  .  levothyroxine (SYNTHROID) 100 MCG tablet, Take 1 tablet (100 mcg total) by mouth daily., Disp: 90 tablet, Rfl: 1 .  losartan (COZAAR) 50 MG tablet, Take 1 tablet (50 mg total) by mouth daily., Disp: 90 tablet, Rfl: 3 .  montelukast (SINGULAIR) 10 MG tablet, TAKE 1 TABLET BY MOUTH AT  BEDTIME (Patient taking differently: Take 10 mg by mouth at bedtime. ), Disp: 90 tablet, Rfl: 3 .  OXYGEN, Inhale 2 L into the lungs continuous. continuous o2 , Disp: , Rfl:  .  pantoprazole (PROTONIX) 40 MG tablet, TAKE 1 TABLET BY MOUTH  DAILY BEFORE BREAKFAST (Patient taking differently: Take 40 mg by mouth daily. ), Disp: 90 tablet, Rfl: 1 .  SYMBICORT 160-4.5 MCG/ACT inhaler, USE 2 PUFFS BY MOUTH TWO  TIMES DAILY (Patient taking differently: Inhale 2 puffs into the lungs 2 (two) times a day. ), Disp: 30.6 g, Rfl: 1 .  Tiotropium Bromide Monohydrate (SPIRIVA RESPIMAT) 2.5 MCG/ACT AERS, 2 puffs each am, Disp: 1 Inhaler, Rfl: 11 .  traMADol (ULTRAM) 50 MG tablet, TAKE 1-2 TABLETS EVERY 6 HOURS AS NEEDED FOR PAIN (Patient taking differently: Take 50 mg by mouth 2 (two) times daily. ), Disp: 90 tablet, Rfl: 0 .  triamcinolone cream (KENALOG) 0.1 %, Apply 1 application topically 2 (two) times daily as needed (for psoriasis). , Disp: , Rfl:   EXAM:   VITALS per patient if applicable: none reported  GENERAL: alert, oriented, appears well and in no acute distress  HEENT: atraumatic, conjunttiva clear, no obvious abnormalities on inspection of external nose and ears  NECK: normal movements of the head and neck  LUNGS: on inspection no  signs of respiratory distress, breathing rate appears normal, no obvious gross increased work of breathing, gasping or wheezing  CV: no obvious cyanosis  MS: moves all visible extremities without noticeable abnormality  PSYCH/NEURO: pleasant  and cooperative, no obvious depression or anxiety, speech and thought processing grossly intact  ASSESSMENT AND PLAN:   Acquired hypothyroidism  -TSH was low: 0.081. -Will decrease synthroid from 125 to 100 mcg and she will return in 4-6 weeks for repeat TSH.  COPD III spirometry if use FEV1/VC p saba   -She is doing well. -Will arrange for [facility] to assist her with inhaler instruction to avoid a trip to the doctor's office.     I discussed the assessment and treatment plan with the patient. The patient was provided an opportunity to ask questions and all were answered. The patient agreed with the plan and demonstrated an understanding of the instructions.   The patient was advised to call back or seek an in-person evaluation if the symptoms worsen or if the condition fails to improve as anticipated.    Chaya Jan, MD  [facility] at [facility]

## 2019-04-28 DIAGNOSIS — M353 Polymyalgia rheumatica: Secondary | ICD-10-CM | POA: Diagnosis not present

## 2019-04-28 DIAGNOSIS — J9620 Acute and chronic respiratory failure, unspecified whether with hypoxia or hypercapnia: Secondary | ICD-10-CM | POA: Diagnosis not present

## 2019-04-28 DIAGNOSIS — M1711 Unilateral primary osteoarthritis, right knee: Secondary | ICD-10-CM | POA: Diagnosis not present

## 2019-04-28 DIAGNOSIS — Z951 Presence of aortocoronary bypass graft: Secondary | ICD-10-CM | POA: Diagnosis not present

## 2019-04-28 DIAGNOSIS — E039 Hypothyroidism, unspecified: Secondary | ICD-10-CM | POA: Diagnosis not present

## 2019-04-28 DIAGNOSIS — Z96653 Presence of artificial knee joint, bilateral: Secondary | ICD-10-CM | POA: Diagnosis not present

## 2019-04-28 DIAGNOSIS — I252 Old myocardial infarction: Secondary | ICD-10-CM | POA: Diagnosis not present

## 2019-04-28 DIAGNOSIS — I2510 Atherosclerotic heart disease of native coronary artery without angina pectoris: Secondary | ICD-10-CM | POA: Diagnosis not present

## 2019-04-28 DIAGNOSIS — Z87891 Personal history of nicotine dependence: Secondary | ICD-10-CM | POA: Diagnosis not present

## 2019-04-28 DIAGNOSIS — I5032 Chronic diastolic (congestive) heart failure: Secondary | ICD-10-CM | POA: Diagnosis not present

## 2019-04-28 DIAGNOSIS — J8410 Pulmonary fibrosis, unspecified: Secondary | ICD-10-CM | POA: Diagnosis not present

## 2019-04-28 DIAGNOSIS — J441 Chronic obstructive pulmonary disease with (acute) exacerbation: Secondary | ICD-10-CM | POA: Diagnosis not present

## 2019-04-28 DIAGNOSIS — I110 Hypertensive heart disease with heart failure: Secondary | ICD-10-CM | POA: Diagnosis not present

## 2019-05-04 DIAGNOSIS — J9620 Acute and chronic respiratory failure, unspecified whether with hypoxia or hypercapnia: Secondary | ICD-10-CM | POA: Diagnosis not present

## 2019-05-04 DIAGNOSIS — J441 Chronic obstructive pulmonary disease with (acute) exacerbation: Secondary | ICD-10-CM | POA: Diagnosis not present

## 2019-05-04 DIAGNOSIS — J8410 Pulmonary fibrosis, unspecified: Secondary | ICD-10-CM | POA: Diagnosis not present

## 2019-05-04 DIAGNOSIS — I2510 Atherosclerotic heart disease of native coronary artery without angina pectoris: Secondary | ICD-10-CM | POA: Diagnosis not present

## 2019-05-04 DIAGNOSIS — I110 Hypertensive heart disease with heart failure: Secondary | ICD-10-CM | POA: Diagnosis not present

## 2019-05-04 DIAGNOSIS — Z87891 Personal history of nicotine dependence: Secondary | ICD-10-CM | POA: Diagnosis not present

## 2019-05-09 ENCOUNTER — Ambulatory Visit: Payer: Medicare Other | Admitting: Internal Medicine

## 2019-05-09 DIAGNOSIS — Z87891 Personal history of nicotine dependence: Secondary | ICD-10-CM | POA: Diagnosis not present

## 2019-05-09 DIAGNOSIS — I2510 Atherosclerotic heart disease of native coronary artery without angina pectoris: Secondary | ICD-10-CM | POA: Diagnosis not present

## 2019-05-09 DIAGNOSIS — I110 Hypertensive heart disease with heart failure: Secondary | ICD-10-CM | POA: Diagnosis not present

## 2019-05-09 DIAGNOSIS — J441 Chronic obstructive pulmonary disease with (acute) exacerbation: Secondary | ICD-10-CM | POA: Diagnosis not present

## 2019-05-09 DIAGNOSIS — J8410 Pulmonary fibrosis, unspecified: Secondary | ICD-10-CM | POA: Diagnosis not present

## 2019-05-09 DIAGNOSIS — J9620 Acute and chronic respiratory failure, unspecified whether with hypoxia or hypercapnia: Secondary | ICD-10-CM | POA: Diagnosis not present

## 2019-05-17 ENCOUNTER — Telehealth: Payer: Self-pay | Admitting: Internal Medicine

## 2019-05-17 DIAGNOSIS — J8410 Pulmonary fibrosis, unspecified: Secondary | ICD-10-CM | POA: Diagnosis not present

## 2019-05-17 DIAGNOSIS — J441 Chronic obstructive pulmonary disease with (acute) exacerbation: Secondary | ICD-10-CM | POA: Diagnosis not present

## 2019-05-17 DIAGNOSIS — I2510 Atherosclerotic heart disease of native coronary artery without angina pectoris: Secondary | ICD-10-CM | POA: Diagnosis not present

## 2019-05-17 DIAGNOSIS — J9620 Acute and chronic respiratory failure, unspecified whether with hypoxia or hypercapnia: Secondary | ICD-10-CM | POA: Diagnosis not present

## 2019-05-17 DIAGNOSIS — I110 Hypertensive heart disease with heart failure: Secondary | ICD-10-CM | POA: Diagnosis not present

## 2019-05-17 DIAGNOSIS — Z87891 Personal history of nicotine dependence: Secondary | ICD-10-CM | POA: Diagnosis not present

## 2019-05-17 NOTE — Telephone Encounter (Signed)
ok 

## 2019-05-17 NOTE — Telephone Encounter (Signed)
Mo calling with [facility] called to let us know that pt wanted to discharge form nursing.

## 2019-05-26 DIAGNOSIS — J9620 Acute and chronic respiratory failure, unspecified whether with hypoxia or hypercapnia: Secondary | ICD-10-CM | POA: Diagnosis not present

## 2019-05-26 DIAGNOSIS — J8410 Pulmonary fibrosis, unspecified: Secondary | ICD-10-CM | POA: Diagnosis not present

## 2019-05-26 DIAGNOSIS — I110 Hypertensive heart disease with heart failure: Secondary | ICD-10-CM | POA: Diagnosis not present

## 2019-05-26 DIAGNOSIS — Z87891 Personal history of nicotine dependence: Secondary | ICD-10-CM | POA: Diagnosis not present

## 2019-05-26 DIAGNOSIS — I2510 Atherosclerotic heart disease of native coronary artery without angina pectoris: Secondary | ICD-10-CM | POA: Diagnosis not present

## 2019-05-26 DIAGNOSIS — J441 Chronic obstructive pulmonary disease with (acute) exacerbation: Secondary | ICD-10-CM | POA: Diagnosis not present

## 2019-05-27 ENCOUNTER — Telehealth: Payer: Self-pay | Admitting: Internal Medicine

## 2019-05-27 ENCOUNTER — Other Ambulatory Visit: Payer: Self-pay | Admitting: Internal Medicine

## 2019-05-27 MED ORDER — SPIRIVA RESPIMAT 2.5 MCG/ACT IN AERS: INHALATION_SPRAY | RESPIRATORY_TRACT | 3 refills | Status: DC

## 2019-05-27 NOTE — Telephone Encounter (Signed)
Call returned to patient, I made her aware that I do not see a telephone encounter so I am not sure who called. Voiced understanding. Nothing further is needed at this time.

## 2019-05-30 ENCOUNTER — Ambulatory Visit: Payer: Medicare Other | Admitting: Internal Medicine

## 2019-05-31 ENCOUNTER — Ambulatory Visit: Payer: Medicare Other | Admitting: Internal Medicine

## 2019-05-31 DIAGNOSIS — Z79899 Other long term (current) drug therapy: Secondary | ICD-10-CM | POA: Diagnosis not present

## 2019-05-31 DIAGNOSIS — M0589 Other rheumatoid arthritis with rheumatoid factor of multiple sites: Secondary | ICD-10-CM | POA: Diagnosis not present

## 2019-06-07 ENCOUNTER — Telehealth (INDEPENDENT_AMBULATORY_CARE_PROVIDER_SITE_OTHER): Payer: Medicare Other | Admitting: Internal Medicine

## 2019-06-07 ENCOUNTER — Other Ambulatory Visit: Payer: Self-pay

## 2019-06-07 DIAGNOSIS — J441 Chronic obstructive pulmonary disease with (acute) exacerbation: Secondary | ICD-10-CM | POA: Diagnosis not present

## 2019-06-07 DIAGNOSIS — R197 Diarrhea, unspecified: Secondary | ICD-10-CM

## 2019-06-07 MED ORDER — SACCHAROMYCES BOULARDII 250 MG PO CAPS: 250.0000 mg | ORAL_CAPSULE | Freq: Two times a day (BID) | ORAL | 1 refills | Status: DC

## 2019-06-07 MED ORDER — AZITHROMYCIN 250 MG PO TABS: ORAL_TABLET | ORAL | 0 refills | Status: DC

## 2019-06-07 NOTE — Progress Notes (Signed)
Virtual Visit via Video Note  I connected with [name] on 06/07/19 at  2:30 PM EDT by a video enabled telemedicine application and verified that I am speaking with the correct person using two identifiers.  Location patient: home Location provider: work office Persons participating in the virtual visit: patient, provider  I discussed the limitations of evaluation and management by telemedicine and the availability of in person appointments. The patient expressed understanding and agreed to proceed.   HPI: [name] has scheduled this visit to discuss some acute issues.  1. [name] has a productive cough of creamy yellow sputum. [name] has oxygen-dependent COPD. This is a change in sputum color for her. No fevers, maybe a slight increase in SOB from her baseline, but nothing that is truly concerning to her at the moment. [name] has had to use her rescue inhaler a little over the past 2-3 days. [name] does not leave her house for anything but MD appointments. Groceries and meds get delivered. Only ppl to come in her house are daughter and grandson and always with a mask.  2. Diarrhea. [name] has been having this every morning for a couple months now. [name] has about 3-4 episodes of loose stools and then it resolves. No relation to dairy products.   ROS: Constitutional: Denies fever, chills, diaphoresis, appetite change and fatigue.  HEENT: Denies photophobia, eye pain, redness, hearing loss, ear pain, congestion, sore throat, rhinorrhea, sneezing, mouth sores, trouble swallowing, neck pain, neck stiffness and tinnitus.   Respiratory: Denies chest tightness. Cardiovascular: Denies chest pain, palpitations and leg swelling.  Gastrointestinal: Denies nausea, vomiting, abdominal pain,  constipation, blood in stool and abdominal distention.  Genitourinary: Denies dysuria, urgency, frequency, hematuria, flank pain and difficulty urinating.  Endocrine: Denies: hot or cold intolerance, sweats, changes in  hair or nails, polyuria, polydipsia. Musculoskeletal: Denies myalgias, back pain, joint swelling, arthralgias and gait problem.  Skin: Denies pallor, rash and wound.  Neurological: Denies dizziness, seizures, syncope, weakness, light-headedness, numbness and headaches.  Hematological: Denies adenopathy. Easy bruising, personal or family bleeding history  Psychiatric/Behavioral: Denies suicidal ideation, mood changes, confusion, nervousness, sleep disturbance and agitation   Past Medical History:  Diagnosis Date  . Acute bronchitis 09/25/2015  . ACUTE ON CHRONIC DIASTOLIC HEART FAILURE 12/04/2010   Qualifier: Diagnosis of  By: Ladona Ridgelaylor, MD, [facility], Vergia AlconGregg William   . Arthritis    OA RIGHT KNEE WITH PAIN  . Barrett esophagus   . Bradycardia 06/01/2015  . CHF (congestive heart failure) (HCC)   . Chronic respiratory failure (HCC)   . Chronic respiratory failure with hypoxia and hypercapnia (HCC) 02/04/2010   Followed in Pulmonary clinic/ [facility]/ [facility]       - 02 dependent  since 07/02/10 >>  83% RA December 05, 2010       - ONO RA 08/05/12  :  Positive sat < 89 x 2:5063m> repeat on 2lpm rec 08/12/2012  - 06/17/2013 reported desat with activity p Knee surgery > rec restart 2lpm with activity  - 06/27/2013   Walked 2lpm  x one lap @ 185 stopped due to sat 88% not sob , desat to 82% on RA just at th  . COPD (chronic obstructive pulmonary disease) (HCC)   . COPD III spirometry if use FEV1/VC p saba  07/18/2010   Quit smoking May 2006       - PFT's  04/12/10 FEV1  1.21 (69%) ratio 77 and no change p B2,  DLC0 56%  VC 70%         - PFTs  08/08/2013 FEV1 1.21 (60%) ratio 86 and no change p B2 DLCO 79%  VC 72%  On symbicort 160 2bid  - PFT's  02/08/2018  FEV1 0.70 (40 % ) ratio 56 if use FEV1/VC  p 38 % improvement from saba p symb 160 prior to study with DLCO  78 % corrects to 147  % for alv volume   - 02/08/2018  . Cough variant asthma 02/26/2011   Followed in Pulmonary clinic/ [facility]/ [facility]  - PFT's   06/04/15  FEV1 1.20 (67 % ) ratio 83  p 6 % improvement from saba with DLCO  80 % corrects to 132 % for alv volume      - Clinical dx based on response to symbicort       FENO 09/16/2016  =   96 on symbicort 160 2bid > added singulair  Allergy profile 09/16/2016 >  Eos 0.5 /  IgE  78 neg RAST  -  Referred to rehab 04/29/2017 > completed  . DOE (dyspnea on exertion) 02/19/2016  . Essential hypertension 04/20/2007   Qualifier: Diagnosis of  By: Marcelyn Ditty RN, Katy Fitch   . GERD (gastroesophageal reflux disease)   . History of ARDS 2006  . History of home oxygen therapy    AT NIGHT WHEN SLEEPING 2 L / MIN NASAL CANNULA  . Hyperlipidemia 07/12/2015  . Hypertension   . Hypothyroidism   . Morbid (severe) obesity due to excess calories (HCC) 04/22/2015   pfts with erv 14% 06/04/15  And 33% 02/08/2018   . NSTEMI (non-ST elevated myocardial infarction) (HCC) 05/31/2015  . Pneumococcal pneumonia (HCC) 2006   HOSPITALIZED AND DEVELOPED ARDS  . Psoriatic arthritis (HCC)   . Pulmonary fibrosis (HCC)   . PULMONARY FIBROSIS ILD POST INFLAMMATORY CHRONIC 07/18/2010   Followed as Primary Care Patient/ [facility]/ [facility]  -s/p ARDS 2006 with bacteremic S  Pna       - CT chest 07/03/10 Nonspecific PF mostly upper lobes       - CT chest 12/03/10 acute gg changes and effusions c/w chf - PFT's  02/08/2018  FVC 0.64 (28 %)   with DLCO  78 % corrects to 147 % for alv volume     . Rheumatic disease   . 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 SOB (shortness of breath) on exertion   . Stroke [facility])     Past Surgical History:  Procedure Laterality Date  . ABDOMINOPLASTY    . CARDIAC CATHETERIZATION N/A 06/01/2015   Procedure: Left Heart Cath and Coronary Angiography;  Surgeon: Runell Gess, MD;  Location: [facility] INVASIVE CV LAB;  Service: Cardiovascular;  Laterality: N/A;  . CARPAL TUNNEL RELEASE    . CHOLECYSTECTOMY    . CORONARY ARTERY BYPASS GRAFT N/A 06/04/2015   Procedure: CORONARY ARTERY BYPASS GRAFT times three            with left  internal mammary artery and right leg saphenous vein;  Surgeon: Alleen Borne, MD;  Location: [facility] OR;  Service: Open Heart Surgery;  Laterality: N/A;  . cosmetic breast surgery    . JOINT REPLACEMENT    . KNEE ARTHROSCOPY Left   . TEE WITHOUT CARDIOVERSION  06/04/2015   Procedure: TRANSESOPHAGEAL ECHOCARDIOGRAM (TEE);  Surgeon: Alleen Borne, MD;  Location: [facility] OR;  Service: Open Heart Surgery;;  . TOTAL KNEE ARTHROPLASTY Left   . TOTAL KNEE ARTHROPLASTY Right 06/06/2013  Procedure: RIGHT TOTAL KNEE ARTHROPLASTY;  Surgeon: Loanne Drilling, MD;  Location: [facility] ORS;  Service: Orthopedics;  Laterality: Right;    Family History  Problem Relation Age of Onset  . Breast cancer Mother   . Coronary artery disease Father   . Rheum arthritis Father     SOCIAL HX:   reports that [name] quit smoking about 14 years ago. Her smoking use included cigarettes. [name] has a 87.75 pack-year smoking history. [name] has never used smokeless tobacco. [name] reports that [name] does not drink alcohol or use drugs.   Current Outpatient Medications:  .  acetaminophen (TYLENOL ARTHRITIS PAIN) 650 MG CR tablet, Take 1,300 mg by mouth 2 (two) times daily. , Disp: , Rfl:  .  albuterol (PROAIR HFA) 108 (90 Base) MCG/ACT inhaler, 2 puffs every 4 hours as needed only  if your can't catch your breath (Patient taking differently: Inhale 2 puffs into the lungs every 4 (four) hours as needed for shortness of breath (if you can't catch your breath). ), Disp: 1 Inhaler, Rfl: 11 .  aspirin 81 MG tablet, Take 1 tablet (81 mg total) by mouth daily., Disp: 30 tablet, Rfl: 0 .  atorvastatin (LIPITOR) 40 MG tablet, Take 1 tablet (40 mg total) by mouth daily at 6 PM., Disp: 90 tablet, Rfl: 3 .  azithromycin (ZITHROMAX) 250 MG tablet, Take as directed, Disp: 6 tablet, Rfl: 0 .  Cholecalciferol (VITAMIN D3) 2000 UNITS TABS, Take 2,000 Int'l Units by mouth daily., Disp: , Rfl:  .  citalopram (CELEXA) 40 MG tablet, TAKE 1 TABLET BY MOUTH  EVERY MORNING,  Disp: 90 tablet, Rfl: 1 .  furosemide (LASIX) 20 MG tablet, TAKE 1 TABLET BY MOUTH TWO  TIMES DAILY (Patient taking differently: Take 20 mg by mouth 2 (two) times daily. ), Disp: 180 tablet, Rfl: 2 .  golimumab (SIMPONI ARIA) 50 MG/4ML SOLN injection, Inject 50 mg into the vein every 8 (eight) weeks. , Disp: , Rfl:  .  leflunomide (ARAVA) 20 MG tablet, Take 1 tablet (20 mg total) by mouth daily., Disp: , Rfl:  .  levothyroxine (SYNTHROID) 100 MCG tablet, Take 1 tablet (100 mcg total) by mouth daily., Disp: 90 tablet, Rfl: 1 .  losartan (COZAAR) 50 MG tablet, Take 1 tablet (50 mg total) by mouth daily., Disp: 90 tablet, Rfl: 3 .  montelukast (SINGULAIR) 10 MG tablet, TAKE 1 TABLET BY MOUTH AT  BEDTIME (Patient taking differently: Take 10 mg by mouth at bedtime. ), Disp: 90 tablet, Rfl: 3 .  OXYGEN, Inhale 2 L into the lungs continuous. continuous o2 , Disp: , Rfl:  .  pantoprazole (PROTONIX) 40 MG tablet, TAKE 1 TABLET BY MOUTH  DAILY BEFORE BREAKFAST (Patient taking differently: Take 40 mg by mouth daily. ), Disp: 90 tablet, Rfl: 1 .  saccharomyces boulardii (FLORASTOR) 250 MG capsule, Take 1 capsule (250 mg total) by mouth 2 (two) times daily., Disp: 180 capsule, Rfl: 1 .  SYMBICORT 160-4.5 MCG/ACT inhaler, USE 2 PUFFS BY MOUTH TWO  TIMES DAILY (Patient taking differently: Inhale 2 puffs into the lungs 2 (two) times a day. ), Disp: 30.6 g, Rfl: 1 .  Tiotropium Bromide Monohydrate (SPIRIVA RESPIMAT) 2.5 MCG/ACT AERS, 2 puffs each am, Disp: 12 g, Rfl: 3 .  traMADol (ULTRAM) 50 MG tablet, TAKE 1-2 TABLETS EVERY 6 HOURS AS NEEDED FOR PAIN (Patient taking differently: Take 50 mg by mouth 2 (two) times daily. ), Disp: 90 tablet, Rfl: 0 .  triamcinolone cream (KENALOG) 0.1 %,  Apply 1 application topically 2 (two) times daily as needed (for psoriasis). , Disp: , Rfl:   EXAM:   VITALS per patient if applicable: none reported  GENERAL: alert, oriented, appears well and in no acute distress  HEENT:  atraumatic, conjunttiva clear, no obvious abnormalities on inspection of external nose and ears  NECK: normal movements of the head and neck  LUNGS: on inspection no signs of respiratory distress, breathing rate appears normal, no obvious gross increased work of breathing, gasping or wheezing  CV: no obvious cyanosis  MS: moves all visible extremities without noticeable abnormality  PSYCH/NEURO: pleasant and cooperative, no obvious depression or anxiety, speech and thought processing grossly intact  ASSESSMENT AND PLAN:   COPD exacerbation ([place])  -Concerning for a mild/early COPD exacerbation. -[name] does not believe [name] needs steroids yet. -Will Rx a zpak given change in sputum color. -[name] will notify us if no improvement over the next 7-10 days.  Diarrhea, unspecified type  -Suspect gut flora imbalance given multiple courses of abx thruout the years given her COPD. -Florastor BID.    I discussed the assessment and treatment plan with the patient. The patient was provided an opportunity to ask questions and all were answered. The patient agreed with the plan and demonstrated an understanding of the instructions.   The patient was advised to call back or seek an in-person evaluation if the symptoms worsen or if the condition fails to improve as anticipated.    Lelon Frohlich, MD  [facility] at [facility]

## 2019-06-13 ENCOUNTER — Other Ambulatory Visit: Payer: Self-pay

## 2019-06-14 ENCOUNTER — Other Ambulatory Visit: Payer: Self-pay

## 2019-06-14 ENCOUNTER — Encounter: Payer: Self-pay | Admitting: Internal Medicine

## 2019-06-14 ENCOUNTER — Ambulatory Visit (INDEPENDENT_AMBULATORY_CARE_PROVIDER_SITE_OTHER): Payer: Medicare Other | Admitting: Internal Medicine

## 2019-06-14 DIAGNOSIS — J449 Chronic obstructive pulmonary disease, unspecified: Secondary | ICD-10-CM

## 2019-06-14 DIAGNOSIS — J9612 Chronic respiratory failure with hypercapnia: Secondary | ICD-10-CM | POA: Diagnosis not present

## 2019-06-14 DIAGNOSIS — J9611 Chronic respiratory failure with hypoxia: Secondary | ICD-10-CM | POA: Diagnosis not present

## 2019-06-14 DIAGNOSIS — J8410 Pulmonary fibrosis, unspecified: Secondary | ICD-10-CM

## 2019-06-14 MED ORDER — PREDNISONE 10 MG PO TABS: ORAL_TABLET | ORAL | 0 refills | Status: DC

## 2019-06-14 NOTE — Assessment & Plan Note (Signed)
02 dependent  since 07/02/10 >>  83% RA December 05, 2010       - ONO RA 08/05/12  :  Positive sat < 89 x 2:90m> repeat on 2lpm rec 08/12/2012  - 06/17/2013 reported desat with activity p Knee surgery > rec restart 2lpm with activity  - 06/27/2013   Walked 2lpm  x one lap @ 185 stopped due to sat 88% not sob , desat to 82% on RA just at the  Start of the walk  - 04/10/14  Sats  81% RA and then  Walked 3lpm x  2 laps @ 185 ft each stopped due to end of study, no desat  - HCO3  11/09/17     = 31 - HCO3  08/27/18 = 37   - HCO3  03/11/2019 = 33 - see hypercarbic / hypoxemic RF  Adequate control on present rx, reviewed in detail with pt > no change in rx needed     I had an extended discussion with the patient reviewing all relevant studies completed to date and  lasting 15 to 20 minutes of a 25 minute visit    Each maintenance medication was reviewed in detail including most importantly the difference between maintenance and prns and under what circumstances the prns are to be triggered using an action plan format that is not reflected in the computer generated alphabetically organized AVS.     Please see AVS for specific instructions unique to this visit that I personally wrote and verbalized to the the pt in detail and then reviewed with pt  by my nurse highlighting any  changes in therapy recommended at today's visit to their plan of care.

## 2019-06-14 NOTE — Progress Notes (Signed)
Subjective:   Patient ID: [name], female    DOB: September 07, 1946    MRN: [id number]   Brief patient profile:  48  yowf quit smoking in May 2006 with dx pna/ ards resumed full activity including yardwork at wt 140 and pft's c/w restrictive changes 04/2010 at wt 170 but reported improvement on saba so maintained chronically on symbicort 160  With pfts 02/08/18  c/w GOLD III copd if use FEV1/VC ratio    History of Present Illness  July 18, 2010  1st pulmonary office eval in ER era c/o doe x 6 months progressive indolent onset with copd vs pf.  Ultimately required hosp 8/30-9/1 dx copd/pf ? cause on sulfasalzine and ACE inhib better on 02 and advair but very hoarse with sense of chest congestion and cough with white mucus day > night.  doe x > slow adls.stop advair Start symbicort 160 2 puffs first thing  in am and 2 puffs again in pm about 12 hours later  Work on inhaler technique:   stop benazapril start benicar 40/25  one daily Use 02 sleeping and any activity other than sitting still   04/10/2014 f/u ov/Lyliana Dicenso re: restrictive lung dz/ RA/psoriatric/ symbicort 160 2bid  Chief Complaint  Patient presents with  . Follow-up    Pt states was advised to f/u per [employer].  Pt has been having increased SOB- relates to stress from dealing with her spouse's illness. She is using o2 pretty much 24/7.   trying to do more than baseline activity and finds she needs 02 more. rec Work on inhaler technique:   Ok to just use the symbicort 160 2 puffs in am to see what difference if any this makes in your breathing  Change 02 to 2lpm 24/7 and use 3lpm with activity    06/04/15  cabg      11/10/2017  f/u ov/Marquest Gunkel re: AB / pf related to ALI vs RA / MO  Chief Complaint  Patient presents with  . Follow-up    Breathing has been worse over the past 1-2 months. She states she gets winded just walking from her house to her car. She has been using her albuterol inhaler 4 x daily on average.   on 2lpm at rest and  sleeping needed up to 4lpm continous while doing rehab  but only has pulsed to 3lpm at home per [employer] Much better only while on prednisone / poor hfa noted  Doe = MMRC3 = can't walk 100 yards even at a slow pace at a flat grade s stopping due to sob  Even on 02 but not sure about sats as does not check rec Goal is to keep sats above 90%  - call if you want [employer] to re-evaluate your ambulatory needs  Prednisone 10 mg take  4 each am x 2 days,   2 each am x 2 days,  1 each am x 2 days and stop  Work on inhaler technique:      04/05/18  aecopd > pred x 6 days > back to baseline      10/11/2018  f/u ov/Romin Divita re:  COPD GOLD III/ 02 dep / maint on just symb 160 2bid (no better with incruse) Chief Complaint  Patient presents with  . Follow-up    Breathing is the same. She is using her albuterol inhaler once per wk on average.   Dyspnea:  HT, sometimes [employer] on 3lpm MMRC3 = can't walk 100 yards even at a slow pace  at a flat grade s stopping due to sob   Cough: none Sleeping: on back books under hob plus pillows = 30 degrees  SABA use: rare  02: 2lpm but turns to 3lpm poc with activity    rec Prednisone 10 mg take  4 each am x 2 days,   2 each am x 2 days,  1 each am x 2 days and stop  Work on inhaler technique:   Please schedule a follow up visit in 4 months but call sooner if needed  - add consider adding spiriva next ov if not doing better and insurance will cover   04/18/2019  f/u ov/Valin Massie re: GOLD III spirometry but 02 dep/ maint on symb 160 2bid/spiriva  Chief Complaint  Patient presents with  . Follow-up    Breathing not improving since the last visit. She states she has not been active at all. She has been using her albuterol daily.   Dyspnea:  No longer shopping due to covid 19 does delivery only  Cough: occ spells/ non productive never noct   Sleeping: on elevated to 30 degrees  SABA use: daily use of nebulizer  02: 2lpm but 3lpm with activity  rec Plan A = Automatic =Symbicort  160 x 2 pffs each am followed by spriiva 2 puffs and 12 hours later symbicort 160 x 2  Work on inhaler technique: Plan B = Backup Only use your albuterol(proventil) inhaler as a rescue medication  Plan C = Crisis - only use your albuterol nebulizer if you first try Plan B and it fails to help > ok to use the nebulizer up to every 4 hours but if start needing it regularly call for immediate appointment    06/14/2019  f/u ov/Priyah Schmuck re:  GOLD III spirometry but 02 dep 24/7/ maint on symb 160/spiriva [name] Chief Complaint  Patient presents with  . Follow-up    Breathing is overall doing about the same. She is using her albuterol once per wk on average.   Dyspnea:  room to room now on up to 3lpm with sats ok but feels losing ground steadily since prior ov in terms of activity tol/ housebound mostly at this poit   Cough: some spells daytime, not noct, mostly mucoid production Sleeping: 30 degrees elevation helps some SABA use: rarely hfa/ never neb at this point  02: 2lpm bedtime up to 3lpm with activity    No obvious day to day or daytime variability or assoc   purulent sputum or mucus plugs or hemoptysis or cp or chest tightness, subjective wheeze or overt sinus or hb symptoms.   Sleeping as above  without nocturnal  or early am exacerbation  of respiratory  c/o's or need for noct saba. Also denies any obvious fluctuation of symptoms with weather or environmental changes or other aggravating or alleviating factors except as outlined above   No unusual exposure hx or h/o childhood pna/ asthma or knowledge of premature birth.  Current Allergies, Complete Past Medical History, Past Surgical History, Family History, and Social History were reviewed in [employer] record.  ROS  The following are not active complaints unless bolded Hoarseness, sore throat, dysphagia, dental problems, itching, sneezing,  nasal congestion or discharge of excess mucus or purulent secretions,  ear ache,   fever, chills, sweats, unintended wt loss or wt gain, classically pleuritic or exertional cp,  orthopnea pnd or arm/hand swelling  or leg swelling, presyncope, palpitations, abdominal pain, anorexia, nausea, vomiting, diarrhea  or change  in bowel habits or change in bladder habits, change in stools or change in urine, dysuria, hematuria,  rash, arthralgias, visual complaints, headache, numbness, weakness or ataxia or problems with walking or coordination,  change in mood or  memory.        Current Meds  Medication Sig  . acetaminophen (TYLENOL ARTHRITIS PAIN) 650 MG CR tablet Take 1,300 mg by mouth 2 (two) times daily.   Marland Kitchen albuterol (PROAIR HFA) 108 (90 Base) MCG/ACT inhaler 2 puffs every 4 hours as needed only  if your can't catch your breath (Patient taking differently: Inhale 2 puffs into the lungs every 4 (four) hours as needed for shortness of breath (if you can't catch your breath). )  . aspirin 81 MG tablet Take 1 tablet (81 mg total) by mouth daily.  Marland Kitchen atorvastatin (LIPITOR) 40 MG tablet Take 1 tablet (40 mg total) by mouth daily at 6 PM.  . CALCIUM PO Take 1 tablet by mouth daily.  . Cholecalciferol (VITAMIN D3) 2000 UNITS TABS Take 2,000 Int'l Units by mouth daily.  . citalopram (CELEXA) 40 MG tablet TAKE 1 TABLET BY MOUTH  EVERY MORNING  . furosemide (LASIX) 20 MG tablet TAKE 1 TABLET BY MOUTH TWO  TIMES DAILY (Patient taking differently: Take 20 mg by mouth 2 (two) times daily. )  . golimumab (SIMPONI ARIA) 50 MG/4ML SOLN injection Inject 50 mg into the vein every 8 (eight) weeks.   Marland Kitchen leflunomide (ARAVA) 20 MG tablet Take 1 tablet (20 mg total) by mouth daily.  Marland Kitchen levothyroxine (SYNTHROID) 100 MCG tablet Take 1 tablet (100 mcg total) by mouth daily.  Marland Kitchen losartan (COZAAR) 50 MG tablet Take 1 tablet (50 mg total) by mouth daily.  . montelukast (SINGULAIR) 10 MG tablet TAKE 1 TABLET BY MOUTH AT  BEDTIME (Patient taking differently: Take 10 mg by mouth at bedtime. )  . OXYGEN  Inhale 2 L into the lungs continuous. continuous o2   . pantoprazole (PROTONIX) 40 MG tablet TAKE 1 TABLET BY MOUTH  DAILY BEFORE BREAKFAST (Patient taking differently: Take 40 mg by mouth daily. )  . saccharomyces boulardii (FLORASTOR) 250 MG capsule Take 1 capsule (250 mg total) by mouth 2 (two) times daily.  . SYMBICORT 160-4.5 MCG/ACT inhaler USE 2 PUFFS BY MOUTH TWO  TIMES DAILY (Patient taking differently: Inhale 2 puffs into the lungs 2 (two) times a day. )  . Tiotropium Bromide Monohydrate (SPIRIVA RESPIMAT) 2.5 MCG/ACT AERS 2 puffs each am  . traMADol (ULTRAM) 50 MG tablet TAKE 1-2 TABLETS EVERY 6 HOURS AS NEEDED FOR PAIN (Patient taking differently: Take 50 mg by mouth 2 (two) times daily. )  . triamcinolone cream (KENALOG) 0.1 % Apply 1 application topically 2 (two) times daily as needed (for psoriasis).             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       - CT chest 07/03/10 Nonspecific PF mostly upper lobes       - CT chest 12/03/10 acute gg changes and effusions c/w chf        - PFT's  04/12/10 FEV1  1.21 (69%) ratio 77 and no change p B2,  DLC0 56% Asthmatic bronchitis     - HFA 50% p coaching July 18, 2010 >>  90%  02/25/2011  history of mild vitamin D deficiency        Objective:   Physical Exam   Obese wf now w/c bound  Vital signs reviewed - Note on arrival 02 sats  94% on 2lpm poc           wt 192 July 18, 2010 > 197 August 21, 2010 > 207 December 05, 2010  > 183  01/21/2011  > 179 02/25/2011 >189 08/10/2012 >195 09/08/12 >200 12/27/12 > 202 05/03/2013 > 06/28/2013  199 > 08/08/13 194 > 04/10/2014   203 > 04/19/2015   176 >  07/23/2015 166 > 09/16/2016  210 >  10/29/2016  209 > 04/29/2017    206 > 11/10/2017   209 > 02/08/2018  207 > 03/24/2018 209> 04/23/2018  208> 10/11/2018   208  > 04/18/2019  203 > 06/14/2019   208        HEENT: top denture/  nl  oropharynx. Nl external ear canals without cough reflex -  Mild bilateral non-specific turbinate edema     NECK :  without JVD/Nodes/TM/ nl carotid upstrokes bilaterally   LUNGS: no acc muscle use,  Mild barrel  contour chest wall with bilateral  Distant bs c audible late exp wheezes    without cough on insp or exp maneuver and mild  Hyperresonant  to  percussion bilaterally     CV:  RRR  no s3 or murmur or increase in P2, and no edema   ABD:  Obese soft and nontender with pos end  insp Hoover's  in the supine position. No bruits or organomegaly appreciated, bowel sounds nl  MS:   Nl gait/  ext warm without deformities, calf tenderness, cyanosis or clubbing No obvious joint restrictions   SKIN: warm and dry without lesions    NEURO:  alert, approp, nl sensorium with  no motor or cerebellar deficits apparent.

## 2019-06-14 NOTE — Assessment & Plan Note (Signed)
s/p ARDS 2006 with bacteremic S  Pna       - CT chest 07/03/10 Nonspecific PF mostly upper lobes       - CT chest 12/03/10 acute gg changes and effusions c/w chf - PFT's  02/08/2018  FVC 0.64 (28 %)   with DLCO  78 % corrects to 147 % for alv volume    Sats ok on 02 make this a less likely contributor

## 2019-06-14 NOTE — Assessment & Plan Note (Signed)
Quit smoking May 2006       - PFT's  04/12/10 FEV1  1.21 (69%) ratio 77 and no change p B2,  DLC0 56%   VC 70%         - PFTs  08/08/2013 FEV1 1.21 (60%) ratio 86 and no change p B2 DLCO 79%  VC 72%  On symbicort 160 2bid  - PFT's  02/08/2018  FEV1 0.70 (40 % ) ratio 56 if use FEV1/VC  p 38 % improvement from saba p symb 160 prior to study with DLCO  78 % corrects to 147  % for alv volume   - 02/08/2018  After extensive coaching inhaler device  effectiveness =    75% with mdi and 90% with dpi so change to Trelegy > no change from sample so rec resume symb 160 2 bid as of 03/24/2018  - 04/18/2019  After extensive coaching inhaler device,  effectiveness =    75% (short ti) with hfa, better with smi > changed spiriva to smi   Gradually losing ground s evidence chf/ purulent sputum/ desats and does have late exp wheeze on exam.  May well be not able to use hfa/smi effectively since air trapping and really not using back ups effectively/ appropriately   Therefore rec pred x 12 days and regoup in 4 weeks, sooner prn

## 2019-06-14 NOTE — Patient Instructions (Signed)
Prednisone 10 mg Take 4 for three days 3 for three days 2 for three days 1 for three days and stop    No change in medications   Please schedule a follow up office visit in 4 weeks, sooner if needed

## 2019-06-15 DIAGNOSIS — M150 Primary generalized (osteo)arthritis: Secondary | ICD-10-CM | POA: Diagnosis not present

## 2019-06-15 DIAGNOSIS — E669 Obesity, unspecified: Secondary | ICD-10-CM | POA: Diagnosis not present

## 2019-06-15 DIAGNOSIS — L409 Psoriasis, unspecified: Secondary | ICD-10-CM | POA: Diagnosis not present

## 2019-06-15 DIAGNOSIS — M8580 Other specified disorders of bone density and structure, unspecified site: Secondary | ICD-10-CM | POA: Diagnosis not present

## 2019-06-15 DIAGNOSIS — Z6841 Body Mass Index (BMI) 40.0 and over, adult: Secondary | ICD-10-CM | POA: Diagnosis not present

## 2019-06-15 DIAGNOSIS — Z79899 Other long term (current) drug therapy: Secondary | ICD-10-CM | POA: Diagnosis not present

## 2019-06-15 DIAGNOSIS — I252 Old myocardial infarction: Secondary | ICD-10-CM | POA: Diagnosis not present

## 2019-06-15 DIAGNOSIS — M0589 Other rheumatoid arthritis with rheumatoid factor of multiple sites: Secondary | ICD-10-CM | POA: Diagnosis not present

## 2019-06-16 ENCOUNTER — Other Ambulatory Visit (INDEPENDENT_AMBULATORY_CARE_PROVIDER_SITE_OTHER): Payer: Medicare Other

## 2019-06-16 ENCOUNTER — Other Ambulatory Visit: Payer: Self-pay

## 2019-06-16 ENCOUNTER — Telehealth: Payer: Self-pay | Admitting: Internal Medicine

## 2019-06-16 DIAGNOSIS — E039 Hypothyroidism, unspecified: Secondary | ICD-10-CM | POA: Diagnosis not present

## 2019-06-16 NOTE — Telephone Encounter (Signed)
Virtual visit scheduled for 06/17/2019.

## 2019-06-16 NOTE — Telephone Encounter (Signed)
The patient came in today for labs and was wanting to talk with Jerilee Hoh or Apolonio Schneiders. She has been experiencing something at home and out of the house. She thinks that she might be have anxiety or panic attacks. She doesn't want to be drugged up she just wants something on an as needed purpose.  Please advise

## 2019-06-17 ENCOUNTER — Other Ambulatory Visit: Payer: Self-pay | Admitting: Internal Medicine

## 2019-06-17 ENCOUNTER — Telehealth (INDEPENDENT_AMBULATORY_CARE_PROVIDER_SITE_OTHER): Payer: Medicare Other | Admitting: Internal Medicine

## 2019-06-17 DIAGNOSIS — Z9181 History of falling: Secondary | ICD-10-CM

## 2019-06-17 DIAGNOSIS — J449 Chronic obstructive pulmonary disease, unspecified: Secondary | ICD-10-CM

## 2019-06-17 DIAGNOSIS — R5381 Other malaise: Secondary | ICD-10-CM

## 2019-06-17 DIAGNOSIS — F419 Anxiety disorder, unspecified: Secondary | ICD-10-CM

## 2019-06-17 LAB — TSH: TSH: 3.32 u[IU]/mL (ref 0.35–4.50)

## 2019-06-17 MED ORDER — CLONAZEPAM 0.5 MG PO TABS: 0.2500 mg | ORAL_TABLET | Freq: Every evening | ORAL | 1 refills | Status: DC | PRN

## 2019-06-17 NOTE — Progress Notes (Signed)
Virtual Visit via Video Note  I connected with [name] on 06/17/19 at  3:00 PM EDT by a video enabled telemedicine application and verified that I am speaking with the correct person using two identifiers.  Location patient: home Location provider: work office Persons participating in the virtual visit: patient, provider  I discussed the limitations of evaluation and management by telemedicine and the availability of in person appointments. The patient expressed understanding and agreed to proceed.   HPI: She has scheduled this visit to discuss increased physical deconditioning and weakness as well as anxiety. When she has to leave the house for doctor appointments she becomes very shaky and nervous. She is finding it more difficult to ambulate and that makes her nervous. While visiting the pulmonologist earlier this week she had to request a WC because of weakness and anxiety. She doesn't want to take a medication every day, but feels like she needs something PRN for her severe anxiety. She completed zpak prescribed last week and her sputum has returned to usual color. She is taking florastor for chronic diarrhea and has noted some improvement.   ROS: Constitutional: Denies fever, chills, diaphoresis, appetite change and fatigue.  HEENT: Denies photophobia, eye pain, redness, hearing loss, ear pain, congestion, sore throat, rhinorrhea, sneezing, mouth sores, trouble swallowing, neck pain, neck stiffness and tinnitus.   Respiratory: Denies SOB, DOE, cough, chest tightness,  and wheezing.   Cardiovascular: Denies chest pain, palpitations and leg swelling.  Gastrointestinal: Denies nausea, vomiting, abdominal pain, diarrhea, constipation, blood in stool and abdominal distention.  Genitourinary: Denies dysuria, urgency, frequency, hematuria, flank pain and difficulty urinating.  Endocrine: Denies: hot or cold intolerance, sweats, changes in hair or nails, polyuria, polydipsia.  Musculoskeletal: Denies myalgias, back pain, joint swelling, arthralgias and gait problem.  Skin: Denies pallor, rash and wound.  Neurological: Denies dizziness, seizures, syncope, light-headedness, numbness and headaches.  Hematological: Denies adenopathy. Easy bruising, personal or family bleeding history  Psychiatric/Behavioral: Denies suicidal ideation, mood changes, confusion, nervousness, sleep disturbance and agitation   Past Medical History:  Diagnosis Date  . Acute bronchitis 09/25/2015  . ACUTE ON CHRONIC DIASTOLIC HEART FAILURE 12/04/2010   Qualifier: Diagnosis of  By: Ladona Ridgel, MD, [facility], [place]   . Arthritis    OA RIGHT KNEE WITH PAIN  . Barrett esophagus   . Bradycardia 06/01/2015  . CHF (congestive heart failure) (HCC)   . Chronic respiratory failure (HCC)   . Chronic respiratory failure with hypoxia and hypercapnia (HCC) 02/04/2010   Followed in Pulmonary clinic/ [facility]/ [facility]       - 02 dependent  since 07/02/10 >>  83% RA December 05, 2010       - ONO RA 08/05/12  :  Positive sat < 89 x 2:47m> repeat on 2lpm rec 08/12/2012  - 06/17/2013 reported desat with activity p Knee surgery > rec restart 2lpm with activity  - 06/27/2013   Walked 2lpm  x one lap @ 185 stopped due to sat 88% not sob , desat to 82% on RA just at th  . COPD (chronic obstructive pulmonary disease) (HCC)   . COPD III spirometry if use FEV1/VC p saba  07/18/2010   Quit smoking May 2006       - PFT's  04/12/10 FEV1  1.21 (69%) ratio 77 and no change p B2,  DLC0 56%   VC 70%         - PFTs  08/08/2013 FEV1 1.21 (60%) ratio 86  and no change p B2 DLCO 79%  VC 72%  On symbicort 160 2bid  - PFT's  02/08/2018  FEV1 0.70 (40 % ) ratio 56 if use FEV1/VC  p 38 % improvement from saba p symb 160 prior to study with DLCO  78 % corrects to 147  % for alv volume   - 02/08/2018  . Cough variant asthma 02/26/2011   Followed in Pulmonary clinic/ [facility]/ [facility]  - PFT's  06/04/15  FEV1 1.20 (67 % ) ratio 83  p 6 %  improvement from saba with DLCO  80 % corrects to 132 % for alv volume      - Clinical dx based on response to symbicort       FENO 09/16/2016  =   96 on symbicort 160 2bid > added singulair  Allergy profile 09/16/2016 >  Eos 0.5 /  IgE  78 neg RAST  -  Referred to rehab 04/29/2017 > completed  . DOE (dyspnea on exertion) 02/19/2016  . Essential hypertension 04/20/2007   Qualifier: Diagnosis of  By: Marcelyn Ditty RN, Katy Fitch   . GERD (gastroesophageal reflux disease)   . History of ARDS 2006  . History of home oxygen therapy    AT NIGHT WHEN SLEEPING 2 L / MIN NASAL CANNULA  . Hyperlipidemia 07/12/2015  . Hypertension   . Hypothyroidism   . Morbid (severe) obesity due to excess calories (HCC) 04/22/2015   pfts with erv 14% 06/04/15  And 33% 02/08/2018   . NSTEMI (non-ST elevated myocardial infarction) (HCC) 05/31/2015  . Pneumococcal pneumonia (HCC) 2006   HOSPITALIZED AND DEVELOPED ARDS  . Psoriatic arthritis (HCC)   . Pulmonary fibrosis (HCC)   . PULMONARY FIBROSIS ILD POST INFLAMMATORY CHRONIC 07/18/2010   Followed as Primary Care Patient/ [facility]/ [facility]  -s/p ARDS 2006 with bacteremic S  Pna       - CT chest 07/03/10 Nonspecific PF mostly upper lobes       - CT chest 12/03/10 acute gg changes and effusions c/w chf - PFT's  02/08/2018  FVC 0.64 (28 %)   with DLCO  78 % corrects to 147 % for alv volume     . Rheumatic disease   . 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 SOB (shortness of breath) on exertion   . Stroke [facility])     Past Surgical History:  Procedure Laterality Date  . ABDOMINOPLASTY    . CARDIAC CATHETERIZATION N/A 06/01/2015   Procedure: Left Heart Cath and Coronary Angiography;  Surgeon: Runell Gess, MD;  Location: [facility] INVASIVE CV LAB;  Service: Cardiovascular;  Laterality: N/A;  . CARPAL TUNNEL RELEASE    . CHOLECYSTECTOMY    . CORONARY ARTERY BYPASS GRAFT N/A 06/04/2015   Procedure: CORONARY ARTERY BYPASS GRAFT times three            with left internal mammary artery and right leg saphenous  vein;  Surgeon: Alleen Borne, MD;  Location: [facility] OR;  Service: Open Heart Surgery;  Laterality: N/A;  . cosmetic breast surgery    . JOINT REPLACEMENT    . KNEE ARTHROSCOPY Left   . TEE WITHOUT CARDIOVERSION  06/04/2015   Procedure: TRANSESOPHAGEAL ECHOCARDIOGRAM (TEE);  Surgeon: Alleen Borne, MD;  Location: [facility] OR;  Service: Open Heart Surgery;;  . TOTAL KNEE ARTHROPLASTY Left   . TOTAL KNEE ARTHROPLASTY Right 06/06/2013   Procedure: RIGHT TOTAL KNEE ARTHROPLASTY;  Surgeon: Loanne Drilling, MD;  Location: [facility] ORS;  Service:  Orthopedics;  Laterality: Right;    Family History  Problem Relation Age of Onset  . Breast cancer Mother   . Coronary artery disease Father   . Rheum arthritis Father     SOCIAL HX:   reports that she quit smoking about 14 years ago. Her smoking use included cigarettes. She has a 87.75 pack-year smoking history. She has never used smokeless tobacco. She reports that she does not drink alcohol or use drugs.   Current Outpatient Medications:  .  acetaminophen (TYLENOL ARTHRITIS PAIN) 650 MG CR tablet, Take 1,300 mg by mouth 2 (two) times daily. , Disp: , Rfl:  .  albuterol (PROAIR HFA) 108 (90 Base) MCG/ACT inhaler, 2 puffs every 4 hours as needed only  if your can't catch your breath (Patient taking differently: Inhale 2 puffs into the lungs every 4 (four) hours as needed for shortness of breath (if you can't catch your breath). ), Disp: 1 Inhaler, Rfl: 11 .  aspirin 81 MG tablet, Take 1 tablet (81 mg total) by mouth daily., Disp: 30 tablet, Rfl: 0 .  atorvastatin (LIPITOR) 40 MG tablet, Take 1 tablet (40 mg total) by mouth daily at 6 PM., Disp: 90 tablet, Rfl: 3 .  CALCIUM PO, Take 1 tablet by mouth daily., Disp: , Rfl:  .  Cholecalciferol (VITAMIN D3) 2000 UNITS TABS, Take 2,000 Int'l Units by mouth daily., Disp: , Rfl:  .  citalopram (CELEXA) 40 MG tablet, TAKE 1 TABLET BY MOUTH  EVERY MORNING, Disp: 90 tablet, Rfl: 1 .  furosemide (LASIX) 20 MG tablet, TAKE 1  TABLET BY MOUTH TWO  TIMES DAILY (Patient taking differently: Take 20 mg by mouth 2 (two) times daily. ), Disp: 180 tablet, Rfl: 2 .  golimumab (SIMPONI ARIA) 50 MG/4ML SOLN injection, Inject 50 mg into the vein every 8 (eight) weeks. , Disp: , Rfl:  .  leflunomide (ARAVA) 20 MG tablet, Take 1 tablet (20 mg total) by mouth daily., Disp: , Rfl:  .  levothyroxine (SYNTHROID) 100 MCG tablet, Take 1 tablet (100 mcg total) by mouth daily., Disp: 90 tablet, Rfl: 1 .  losartan (COZAAR) 50 MG tablet, Take 1 tablet (50 mg total) by mouth daily., Disp: 90 tablet, Rfl: 3 .  montelukast (SINGULAIR) 10 MG tablet, TAKE 1 TABLET BY MOUTH AT  BEDTIME (Patient taking differently: Take 10 mg by mouth at bedtime. ), Disp: 90 tablet, Rfl: 3 .  OXYGEN, Inhale 2 L into the lungs continuous. continuous o2 , Disp: , Rfl:  .  pantoprazole (PROTONIX) 40 MG tablet, TAKE 1 TABLET BY MOUTH  DAILY BEFORE BREAKFAST (Patient taking differently: Take 40 mg by mouth daily. ), Disp: 90 tablet, Rfl: 1 .  predniSONE (DELTASONE) 10 MG tablet, Take 4 for three days 3 for three days 2 for three days 1 for three days and stop, Disp: 30 tablet, Rfl: 0 .  saccharomyces boulardii (FLORASTOR) 250 MG capsule, Take 1 capsule (250 mg total) by mouth 2 (two) times daily., Disp: 180 capsule, Rfl: 1 .  SYMBICORT 160-4.5 MCG/ACT inhaler, USE 2 PUFFS BY MOUTH TWO  TIMES DAILY (Patient taking differently: Inhale 2 puffs into the lungs 2 (two) times a day. ), Disp: 30.6 g, Rfl: 1 .  Tiotropium Bromide Monohydrate (SPIRIVA RESPIMAT) 2.5 MCG/ACT AERS, 2 puffs each am, Disp: 12 g, Rfl: 3 .  traMADol (ULTRAM) 50 MG tablet, TAKE 1-2 TABLETS EVERY 6 HOURS AS NEEDED FOR PAIN (Patient taking differently: Take 50 mg by mouth 2 (two) times  daily. ), Disp: 90 tablet, Rfl: 0 .  triamcinolone cream (KENALOG) 0.1 %, Apply 1 application topically 2 (two) times daily as needed (for psoriasis). , Disp: , Rfl:   EXAM:   VITALS per patient if applicable: none reported   GENERAL: alert, oriented, appears well and in no acute distress  HEENT: atraumatic, conjunttiva clear, no obvious abnormalities on inspection of external nose and ears  NECK: normal movements of the head and neck  LUNGS: on inspection no signs of respiratory distress, breathing rate appears normal, no obvious gross increased work of breathing, gasping or wheezing  CV: no obvious cyanosis  MS: moves all visible extremities without noticeable abnormality  PSYCH/NEURO: pleasant and cooperative, speech and thought processing grossly intact tearful thruout our visit  ASSESSMENT AND PLAN:   Anxiety -Her anxiety is mainly over her increased physical deconditioning. -Will Rx clonazepam 0.5 mg for her to half a tablet at bedtime as needed for anxiety.  Physical deconditioning -Will arrange for [facility] PT and OT.  Chronic obstructive pulmonary disease, unspecified COPD type (HCC) -At baseline. -No changes made at recent pulmonary visit.     I discussed the assessment and treatment plan with the patient. The patient was provided an opportunity to ask questions and all were answered. The patient agreed with the plan and demonstrated an understanding of the instructions.   The patient was advised to call back or seek an in-person evaluation if the symptoms worsen or if the condition fails to improve as anticipated.    Chaya Jan, MD  [facility] at [facility]

## 2019-07-12 ENCOUNTER — Other Ambulatory Visit: Payer: Self-pay | Admitting: Internal Medicine

## 2019-07-12 ENCOUNTER — Other Ambulatory Visit: Payer: Self-pay | Admitting: Cardiology

## 2019-07-12 DIAGNOSIS — R198 Other specified symptoms and signs involving the digestive system and abdomen: Secondary | ICD-10-CM

## 2019-07-12 DIAGNOSIS — E782 Mixed hyperlipidemia: Secondary | ICD-10-CM

## 2019-07-12 DIAGNOSIS — K6289 Other specified diseases of anus and rectum: Secondary | ICD-10-CM

## 2019-07-12 DIAGNOSIS — Z951 Presence of aortocoronary bypass graft: Secondary | ICD-10-CM

## 2019-07-12 DIAGNOSIS — I10 Essential (primary) hypertension: Secondary | ICD-10-CM

## 2019-07-12 DIAGNOSIS — R197 Diarrhea, unspecified: Secondary | ICD-10-CM

## 2019-07-12 DIAGNOSIS — R7989 Other specified abnormal findings of blood chemistry: Secondary | ICD-10-CM

## 2019-07-12 DIAGNOSIS — E032 Hypothyroidism due to medicaments and other exogenous substances: Secondary | ICD-10-CM

## 2019-07-14 ENCOUNTER — Telehealth: Payer: Self-pay | Admitting: Internal Medicine

## 2019-07-14 DIAGNOSIS — E039 Hypothyroidism, unspecified: Secondary | ICD-10-CM

## 2019-07-14 NOTE — Telephone Encounter (Signed)
levothyroxine (SYNTHROID) 100 MCG tablet Was changed to 100MCG but her new bottles she has recently noticed that the RX on the bottle says 125MCG/ Pt wants to make sure or see if this is a mistake. [facility] advised that they have no record of an Rx for 100MCG. Pt would like a refill sent to [facility] if the 125MCG is incorrect / please advise

## 2019-07-15 ENCOUNTER — Ambulatory Visit (INDEPENDENT_AMBULATORY_CARE_PROVIDER_SITE_OTHER): Payer: Medicare Other | Admitting: Internal Medicine

## 2019-07-15 ENCOUNTER — Ambulatory Visit: Payer: Medicare Other | Admitting: Internal Medicine

## 2019-07-15 ENCOUNTER — Other Ambulatory Visit: Payer: Self-pay

## 2019-07-15 ENCOUNTER — Encounter: Payer: Self-pay | Admitting: Internal Medicine

## 2019-07-15 VITALS — BP 128/70 | HR 90 | Temp 97.5°F | Ht 59.0 in | Wt 205.6 lb

## 2019-07-15 DIAGNOSIS — J9612 Chronic respiratory failure with hypercapnia: Secondary | ICD-10-CM

## 2019-07-15 DIAGNOSIS — J9611 Chronic respiratory failure with hypoxia: Secondary | ICD-10-CM | POA: Diagnosis not present

## 2019-07-15 DIAGNOSIS — J441 Chronic obstructive pulmonary disease with (acute) exacerbation: Secondary | ICD-10-CM | POA: Diagnosis not present

## 2019-07-15 DIAGNOSIS — J8410 Pulmonary fibrosis, unspecified: Secondary | ICD-10-CM

## 2019-07-15 DIAGNOSIS — J449 Chronic obstructive pulmonary disease, unspecified: Secondary | ICD-10-CM

## 2019-07-15 MED ORDER — LEVOTHYROXINE SODIUM 100 MCG PO TABS: 100.0000 ug | ORAL_TABLET | Freq: Every day | ORAL | 1 refills | Status: DC

## 2019-07-15 MED ORDER — BUDESONIDE-FORMOTEROL FUMARATE 160-4.5 MCG/ACT IN AERO: 2.0000 | INHALATION_SPRAY | Freq: Two times a day (BID) | RESPIRATORY_TRACT | 0 refills | Status: DC

## 2019-07-15 NOTE — Patient Instructions (Addendum)
Work on inhaler technique:  relax and gently blow all the way out then take a nice smooth deep breath back in, triggering the inhaler at same time you start breathing in.  Hold for up to 5 seconds if you can. Blow symbicort  out thru nose. Rinse and gargle with water when done - remember out how a golfer gets in rhythm.  No change in medications or 02  Please schedule a follow up visit in 3 months but call sooner if needed

## 2019-07-15 NOTE — Telephone Encounter (Signed)
Spoke with patient and she could not find her prescription for synthroid 100 mcg.  A refill has been sent

## 2019-07-15 NOTE — Progress Notes (Signed)
Subjective:   Patient ID: [name], female    DOB: [birth year]    MRN: [id number]   Brief patient profile:  73  yowf  quit smoking in May 2006 with dx pna/ ards resumed full activity including yardwork at wt 140 and pft's c/w restrictive changes 04/2010 at wt 170 but reported improvement on saba so maintained chronically on symbicort 160  With pfts 02/08/18  c/w GOLD III copd if use FEV1/VC ratio    History of Present Illness  July 18, 2010  1st pulmonary office eval in ER era c/o doe x 6 months progressive indolent onset with copd vs pf.  Ultimately required hosp 8/30-9/1 dx copd/pf ? cause on sulfasalzine and ACE inhib better on 02 and advair but very hoarse with sense of chest congestion and cough with white mucus day > night.  doe x > slow adls.stop advair Start symbicort 160 2 puffs first thing  in am and 2 puffs again in pm about 12 hours later  Work on inhaler technique:   stop benazapril start benicar 40/25  one daily Use 02 sleeping and any activity other than sitting still   04/10/2014 f/u ov/[name] re: restrictive lung dz/ RA/psoriatric/ symbicort 160 2bid  Chief Complaint  Patient presents with  . Follow-up    Pt states was advised to f/u per [employer].  Pt has been having increased SOB- relates to stress from dealing with her spouse's illness. She is using o2 pretty much 24/7.   trying to do more than baseline activity and finds she needs 02 more. rec Work on inhaler technique:   Ok to just use the symbicort 160 2 puffs in am to see what difference if any this makes in your breathing  Change 02 to 2lpm 24/7 and use 3lpm with activity    06/04/15  cabg      11/10/2017  f/u ov/[name] re: AB / pf related to ALI vs RA / MO  Chief Complaint  Patient presents with  . Follow-up    Breathing has been worse over the past 1-2 months. She states she gets winded just walking from her house to her car. She has been using her albuterol inhaler 4 x daily on average.   on 2lpm at rest and  sleeping needed up to 4lpm continous while doing rehab  but only has pulsed to 3lpm at home per [employer] Much better only while on prednisone / poor hfa noted  Doe = MMRC3 = can't walk 100 yards even at a slow pace at a flat grade s stopping due to sob  Even on 02 but not sure about sats as does not check rec Goal is to keep sats above 90%  - call if you want [employer] to re-evaluate your ambulatory needs  Prednisone 10 mg take  4 each am x 2 days,   2 each am x 2 days,  1 each am x 2 days and stop  Work on inhaler technique:      04/05/18  aecopd > pred x 6 days > back to baseline      10/11/2018  f/u ov/[name] re:  COPD GOLD III/ 02 dep / maint on just symb 160 2bid (no better with incruse) Chief Complaint  Patient presents with  . Follow-up    Breathing is the same. She is using her albuterol inhaler once per wk on average.   Dyspnea:  HT, sometimes [employer] on 3lpm MMRC3 = can't walk 100 yards even at a slow  pace at a flat grade s stopping due to sob   Cough: none Sleeping: on back books under hob plus pillows = 30 degrees  SABA use: rare  02: 2lpm but turns to 3lpm poc with activity    rec Prednisone 10 mg take  4 each am x 2 days,   2 each am x 2 days,  1 each am x 2 days and stop  Work on inhaler technique:   Please schedule a follow up visit in 4 months but call sooner if needed  - add consider adding spiriva next ov if not doing better and insurance will cover   04/18/2019  f/u ov/[name] re: GOLD III spirometry but 02 dep/ maint on symb 160 2bid/spiriva  Chief Complaint  Patient presents with  . Follow-up    Breathing not improving since the last visit. She states she has not been active at all. She has been using her albuterol daily.   Dyspnea:  No longer shopping due to covid 19 does delivery only  Cough: occ spells/ non productive never noct   Sleeping: on elevated to 30 degrees  SABA use: daily use of nebulizer  02: 2lpm but 3lpm with activity  rec Plan A = Automatic =Symbicort  160 x 2 pffs each am followed by spriiva 2 puffs and 12 hours later symbicort 160 x 2  Work on inhaler technique: Plan B = Backup Only use your albuterol(proventil) inhaler as a rescue medication  Plan C = Crisis - only use your albuterol nebulizer if you first try Plan B and it fails to help > ok to use the nebulizer up to every 4 hours but if start needing it regularly call for immediate appointment    06/14/2019  f/u ov/[name] re:  GOLD III spirometry but 02 dep 24/7/ maint on symb 160/spiriva Laurine Blazer Chief Complaint  Patient presents with  . Follow-up    Breathing is overall doing about the same. She is using her albuterol once per wk on average.   Dyspnea:  room to room now on up to 3lpm with sats ok but feels losing ground steadily since prior ov in terms of activity tol/ housebound mostly at this poit   Cough: some spells daytime, not noct, mostly mucoid production Sleeping: 30 degrees elevation helps some SABA use: rarely hfa/ never neb at this point  02: 2lpm bedtime up to 3lpm with activity  rec Prednisone 10 mg Take 4 for three days 3 for three days 2 for three days 1 for three days and stop  No change in medications    07/15/2019  f/u ov/[name] re: copd  GOLD III spirometry / 02 dep maint on symb/spiriva/singulair  Chief Complaint  Patient presents with  . COPD III spirometry if use FEV1/VC p saba    Doing better since last visit with the combination of Symbicort and Spiriva   Dyspnea:  Up to 5 min on street x 3lpm = MMRC3 = can't walk 100 yards even at a slow pace at a flat grade s stopping due to sob  But not checking sats at peak ex  Cough: not now  Sleeping: 30 degrees hob / no am flares of cough/ wheeze/sob   SABA use: none 02: 2lpm hs, 2lpm resting, 3lpm vacuuming or walking outside 3lpm POC    No obvious day to day or daytime variability or assoc excess/ purulent sputum or mucus plugs or hemoptysis or cp or chest tightness, subjective wheeze or overt sinus or hb  symptoms.   Sleeping as above  without nocturnal  or early am exacerbation  of respiratory  c/o's or need for noct saba. Also denies any obvious fluctuation of symptoms with weather or environmental changes or other aggravating or alleviating factors except as outlined above   No unusual exposure hx or h/o childhood pna/ asthma or knowledge of premature birth.  Current Allergies, Complete Past Medical History, Past Surgical History, Family History, and Social History were reviewed in [employer] record.  ROS  The following are not active complaints unless bolded Hoarseness, sore throat, dysphagia, dental problems, itching, sneezing,  nasal congestion or discharge of excess mucus or purulent secretions, ear ache,   fever, chills, sweats, unintended wt loss or wt gain, classically pleuritic or exertional cp,  orthopnea pnd or arm/hand swelling  or leg swelling, presyncope, palpitations, abdominal pain, anorexia, nausea, vomiting, diarrhea  or change in bowel habits or change in bladder habits, change in stools or change in urine, dysuria, hematuria,  rash, arthralgias, visual complaints, headache, numbness, weakness or ataxia or problems with walking or coordination,  change in mood or  memory.        Current Meds  Medication Sig  . acetaminophen (TYLENOL ARTHRITIS PAIN) 650 MG CR tablet Take 1,300 mg by mouth 2 (two) times daily.   Marland Kitchen albuterol (PROAIR HFA) 108 (90 Base) MCG/ACT inhaler 2 puffs every 4 hours as needed only  if your can't catch your breath (Patient taking differently: Inhale 2 puffs into the lungs every 4 (four) hours as needed for shortness of breath (if you can't catch your breath). )  . aspirin 81 MG tablet Take 1 tablet (81 mg total) by mouth daily.  Marland Kitchen atorvastatin (LIPITOR) 40 MG tablet TAKE 1 TABLET BY MOUTH  DAILY AT 6 PM.  . CALCIUM PO Take 1 tablet by mouth daily.  . Cholecalciferol (VITAMIN D3) 2000 UNITS TABS Take 2,000 Int'l Units by mouth daily.   . citalopram (CELEXA) 40 MG tablet TAKE 1 TABLET BY MOUTH  EVERY MORNING  . clonazePAM (KLONOPIN) 0.5 MG tablet Take 0.5 tablets (0.25 mg total) by mouth at bedtime as needed for anxiety.  . furosemide (LASIX) 20 MG tablet TAKE 1 TABLET BY MOUTH TWO  TIMES DAILY (Patient taking differently: Take 20 mg by mouth 2 (two) times daily. )  . golimumab (SIMPONI ARIA) 50 MG/4ML SOLN injection Inject 50 mg into the vein every 8 (eight) weeks.   Marland Kitchen leflunomide (ARAVA) 20 MG tablet Take 1 tablet (20 mg total) by mouth daily.  Marland Kitchen levothyroxine (SYNTHROID) 100 MCG tablet Take 1 tablet (100 mcg total) by mouth daily.  Marland Kitchen losartan (COZAAR) 50 MG tablet TAKE 1 TABLET BY MOUTH  DAILY  . montelukast (SINGULAIR) 10 MG tablet Take 1 tablet (10 mg total) by mouth at bedtime.  . OXYGEN Inhale 2 L into the lungs continuous. continuous o2   . pantoprazole (PROTONIX) 40 MG tablet TAKE 1 TABLET BY MOUTH  DAILY BEFORE BREAKFAST  . saccharomyces boulardii (FLORASTOR) 250 MG capsule Take 1 capsule (250 mg total) by mouth 2 (two) times daily.  . SYMBICORT 160-4.5 MCG/ACT inhaler USE 2 PUFFS BY MOUTH TWO  TIMES DAILY (Patient taking differently: Inhale 2 puffs into the lungs 2 (two) times a day. )  . Tiotropium Bromide Monohydrate (SPIRIVA RESPIMAT) 2.5 MCG/ACT AERS 2 puffs each am  . traMADol (ULTRAM) 50 MG tablet TAKE 1-2 TABLETS EVERY 6 HOURS AS NEEDED FOR PAIN (Patient taking differently: Take 50 mg by  mouth 2 (two) times daily. )  . triamcinolone cream (KENALOG) 0.1 % Apply 1 application topically 2 (two) times daily as needed (for psoriasis).                   Past Medical History: Psoriatic and rheumatoid  arthritis.............................Marland KitchenZimenski Hypertension      - Try off ACE July 18, 2010 >>  much better  Hypothyroidism Barrett's esophagus history of pneumococcal pneumonia and ARDS 2006 COPD Chronic Respiratory Failure      - 02 dependent  since 07/02/10 >>  83% RA December 05, 2010   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  Pna       - CT chest 07/03/10 Nonspecific PF mostly upper lobes       - CT chest 12/03/10 acute gg changes and effusions c/w chf        - PFT's  04/12/10 FEV1  1.21 (69%) ratio 77 and no change p B2,  DLC0 56% Asthmatic bronchitis     - HFA 50% p coaching July 18, 2010 >>  90%  02/25/2011  history of mild vitamin D deficiency        Objective:   Physical Exam   Obese amb wf no longer w/c bound    Vital signs reviewed - Note on arrival 02 sats  97% on 2lpm           wt 192 July 18, 2010 > 197 August 21, 2010 > 207 December 05, 2010  > 183  01/21/2011  > 179 02/25/2011 >189 08/10/2012 >195 09/08/12 >200 12/27/12 > 202 05/03/2013 > 06/28/2013  199 > 08/08/13 194 > 04/10/2014  203 > 04/19/2015   176 >  07/23/2015 166 > 09/16/2016  210 >  10/29/2016  209 > 04/29/2017    206 > 11/10/2017   209 > 02/08/2018  207 > 03/24/2018 209> 04/23/2018  208> 10/11/2018   208  > 04/18/2019  203 > 06/14/2019   208 > 07/15/2019   205     HEENT : pt wearing mask not removed for exam due to covid -19 concerns.    NECK :  without JVD/Nodes/TM/ nl carotid upstrokes bilaterally   LUNGS: no acc muscle use,  Mod barrel  contour chest wall with bilateral  Distant bs s audible wheeze and  without cough on insp or exp maneuvers and mod  Hyperresonant  to  percussion bilaterally     CV:  RRR  no s3 or murmur or increase in P2, and no edema   ABD:  Obese soft and nontender with pos mid insp Hoover's  in the supine position. No bruits or organomegaly appreciated, bowel sounds nl  MS:  Gait ok/   ext warm without deformities, calf tenderness, cyanosis or clubbing No obvious joint restrictions   SKIN: warm and dry without lesions    NEURO:  alert, approp, nl sensorium with  no motor or cerebellar deficits apparent.             I personally reviewed images and agree with radiology impression as follows:  CXR:   04/08/19 Mild coarsening of the pulmonary interstitium is  unchanged.

## 2019-07-16 ENCOUNTER — Encounter: Payer: Self-pay | Admitting: Internal Medicine

## 2019-07-16 NOTE — Assessment & Plan Note (Signed)
Body mass index is 41.53 kg/m.  -  trending down/ encouraged  Lab Results  Component Value Date   TSH 3.32 06/16/2019     Contributing to gerd risk/ doe/reviewed the need and the process to achieve and maintain neg calorie balance > defer f/u primary care including intermittently monitoring thyroid status

## 2019-07-16 NOTE — Assessment & Plan Note (Signed)
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

## 2019-07-16 NOTE — Assessment & Plan Note (Signed)
02 dependent  since 07/02/10 >>  83% RA December 05, 2010       - ONO RA 08/05/12  :  Positive sat < 89 x 2:58m> repeat on 2lpm rec 08/12/2012  - 06/17/2013 reported desat with activity p Knee surgery > rec restart 2lpm with activity  - 06/27/2013   Walked 2lpm  x one lap @ 185 stopped due to sat 88% not sob , desat to 82% on RA just at the  Start of the walk  - 04/10/14  Sats  81% RA and then  Walked 3lpm x  2 laps @ 185 ft each stopped due to end of study, no desat  - HCO3  11/09/17     = 31 - HCO3  08/27/18 = 37   - HCO3  03/11/2019 = 33    Adequate control on present rx, reviewed in detail with pt > no change in rx needed  = 02: 2lpm hs, 2lpm resting, 3lpm vacuuming or walking outside 3lpm POC   Advised: Make sure you check your oxygen saturations at highest level of activity to be sure it stays over 90% and adjust upward to maintain this level if needed but remember to turn it back to previous settings when you stop (to conserve your supply).    .I had an extended discussion with the patient reviewing all relevant studies completed to date and  lasting 15 to 20 minutes of a 25 minute visit    I performed detailed device teaching using a teach back method which extended face to face time for this visit (see above)  Each maintenance medication was reviewed in detail including emphasizing most importantly the difference between maintenance and prns and under what circumstances the prns are to be triggered using an action plan format that is not reflected in the computer generated alphabetically organized AVS which I have not found useful in most complex patients, especially with respiratory illnesses  Please see AVS for specific instructions unique to this visit that I personally wrote and verbalized to the the pt in detail and then reviewed with pt  by my nurse highlighting any  changes in therapy recommended at today's visit to their plan of care.

## 2019-07-16 NOTE — Assessment & Plan Note (Signed)
Quit smoking May 2006       - PFT's  04/12/10 FEV1  1.21 (69%) ratio 77 and no change p B2,  DLC0 56%   VC 70%         - PFTs  08/08/2013 FEV1 1.21 (60%) ratio 86 and no change p B2 DLCO 79%  VC 72%  On symbicort 160 2bid  - PFT's  02/08/2018  FEV1 0.70 (40 % ) ratio 56 if use FEV1/VC  p 38 % improvement from saba p symb 160 prior to study with DLCO  78 % corrects to 147  % for alv volume   - 02/08/2018  After extensive coaching inhaler device  effectiveness =    75% with mdi and 90% with dpi so change to Trelegy > no change from sample so rec resume symb 160 2 bid as of 03/24/2018  - 04/18/2019    changed spiriva to smi  - 07/15/2019  After extensive coaching inhaler device,  effectiveness =    75% (short Ti)   Despite suboptima technique > Adequate control on present rx, reviewed in detail with pt > no change in rx needed     Group D in terms of symptom/risk and laba/lama/ICS  therefore appropriate rx at this point >>>  Continue symb/spiriva . Good candidate for breztri when available if covered by her plan

## 2019-07-23 ENCOUNTER — Encounter: Payer: Self-pay | Admitting: Internal Medicine

## 2019-07-23 DIAGNOSIS — Z1231 Encounter for screening mammogram for malignant neoplasm of breast: Secondary | ICD-10-CM | POA: Diagnosis not present

## 2019-07-26 DIAGNOSIS — M0589 Other rheumatoid arthritis with rheumatoid factor of multiple sites: Secondary | ICD-10-CM | POA: Diagnosis not present

## 2019-07-26 DIAGNOSIS — Z79899 Other long term (current) drug therapy: Secondary | ICD-10-CM | POA: Diagnosis not present

## 2019-07-29 ENCOUNTER — Encounter: Payer: Self-pay | Admitting: Internal Medicine

## 2019-07-31 ENCOUNTER — Encounter: Payer: Self-pay | Admitting: Internal Medicine

## 2019-08-02 ENCOUNTER — Other Ambulatory Visit: Payer: Self-pay | Admitting: Cardiology

## 2019-08-02 ENCOUNTER — Other Ambulatory Visit: Payer: Self-pay | Admitting: Internal Medicine

## 2019-08-02 ENCOUNTER — Encounter: Payer: Self-pay | Admitting: *Deleted

## 2019-08-02 DIAGNOSIS — M5441 Lumbago with sciatica, right side: Secondary | ICD-10-CM

## 2019-08-02 DIAGNOSIS — K6289 Other specified diseases of anus and rectum: Secondary | ICD-10-CM

## 2019-08-02 DIAGNOSIS — E032 Hypothyroidism due to medicaments and other exogenous substances: Secondary | ICD-10-CM

## 2019-08-02 DIAGNOSIS — R198 Other specified symptoms and signs involving the digestive system and abdomen: Secondary | ICD-10-CM

## 2019-08-02 DIAGNOSIS — R197 Diarrhea, unspecified: Secondary | ICD-10-CM

## 2019-08-02 DIAGNOSIS — R7989 Other specified abnormal findings of blood chemistry: Secondary | ICD-10-CM

## 2019-08-02 DIAGNOSIS — I10 Essential (primary) hypertension: Secondary | ICD-10-CM

## 2019-08-02 DIAGNOSIS — Z951 Presence of aortocoronary bypass graft: Secondary | ICD-10-CM

## 2019-08-02 DIAGNOSIS — E782 Mixed hyperlipidemia: Secondary | ICD-10-CM

## 2019-08-02 MED ORDER — ATORVASTATIN CALCIUM 40 MG PO TABS: ORAL_TABLET | ORAL | 0 refills | Status: DC

## 2019-08-02 MED ORDER — PREDNISONE 10 MG (21) PO TBPK: ORAL_TABLET | ORAL | 0 refills | Status: DC

## 2019-08-02 MED ORDER — CYCLOBENZAPRINE HCL 5 MG PO TABS: 5.0000 mg | ORAL_TABLET | Freq: Every evening | ORAL | 0 refills | Status: DC | PRN

## 2019-08-02 MED ORDER — LOSARTAN POTASSIUM 50 MG PO TABS: 50.0000 mg | ORAL_TABLET | Freq: Every day | ORAL | 0 refills | Status: DC

## 2019-08-02 NOTE — Patient Instructions (Signed)
Acute Back Pain, Adult Acute back pain is sudden and usually short-lived. It is often caused by an injury to the muscles and tissues in the back. The injury may result from:  A muscle or ligament getting overstretched or torn (strained). Ligaments are tissues that connect bones to each other. Lifting something improperly can cause a back strain.  Wear and tear (degeneration) of the spinal disks. Spinal disks are circular tissue that provides cushioning between the bones of the spine (vertebrae).  Twisting motions, such as while playing sports or doing yard work.  A hit to the back.  Arthritis. You may have a physical exam, lab tests, and imaging tests to find the cause of your pain. Acute back pain usually goes away with rest and home care. Follow these instructions at home: Managing pain, stiffness, and swelling  Take over-the-counter and prescription medicines only as told by your health care provider.  Your health care provider may recommend applying ice during the first 24-48 hours after your pain starts. To do this: ? Put ice in a plastic bag. ? Place a towel between your skin and the bag. ? Leave the ice on for 20 minutes, 2-3 times a day.  If directed, apply heat to the affected area as often as told by your health care provider. Use the heat source that your health care provider recommends, such as a moist heat pack or a heating pad. ? Place a towel between your skin and the heat source. ? Leave the heat on for 20-30 minutes. ? Remove the heat if your skin turns bright red. This is especially important if you are unable to feel pain, heat, or cold. You have a greater risk of getting burned. Activity   Do not stay in bed. Staying in bed for more than 1-2 days can delay your recovery.  Sit up and stand up straight. Avoid leaning forward when you sit, or hunching over when you stand. ? If you work at a desk, sit close to it so you do not need to lean over. Keep your chin tucked  in. Keep your neck drawn back, and keep your elbows bent at a right angle. Your arms should look like the letter "L." ? Sit high and close to the steering wheel when you drive. Add lower back (lumbar) support to your car seat, if needed.  Take short walks on even surfaces as soon as you are able. Try to increase the length of time you walk each day.  Do not sit, drive, or stand in one place for more than 30 minutes at a time. Sitting or standing for long periods of time can put stress on your back.  Do not drive or use heavy machinery while taking prescription pain medicine.  Use proper lifting techniques. When you bend and lift, use positions that put less stress on your back: ? Bend your knees. ? Keep the load close to your body. ? Avoid twisting.  Exercise regularly as told by your health care provider. Exercising helps your back heal faster and helps prevent back injuries by keeping muscles strong and flexible.  Work with a physical therapist to make a safe exercise program, as recommended by your health care provider. Do any exercises as told by your physical therapist. Lifestyle  Maintain a healthy weight. Extra weight puts stress on your back and makes it difficult to have good posture.  Avoid activities or situations that make you feel anxious or stressed. Stress and anxiety increase muscle   tension and can make back pain worse. Learn ways to manage anxiety and stress, such as through exercise. General instructions  Sleep on a firm mattress in a comfortable position. Try lying on your side with your knees slightly bent. If you lie on your back, put a pillow under your knees.  Follow your treatment plan as told by your health care provider. This may include: ? Cognitive or behavioral therapy. ? Acupuncture or massage therapy. ? Meditation or yoga. Contact a health care provider if:  You have pain that is not relieved with rest or medicine.  You have increasing pain going down  into your legs or buttocks.  Your pain does not improve after 2 weeks.  You have pain at night.  You lose weight without trying.  You have a fever or chills. Get help right away if:  You develop new bowel or bladder control problems.  You have unusual weakness or numbness in your arms or legs.  You develop nausea or vomiting.  You develop abdominal pain.  You feel faint. Summary  Acute back pain is sudden and usually short-lived.  Use proper lifting techniques. When you bend and lift, use positions that put less stress on your back.  Take over-the-counter and prescription medicines and apply heat or ice as directed by your health care provider. This information is not intended to replace advice given to you by your health care provider. Make sure you discuss any questions you have with your health care provider. Document Released: 10/20/2005 Document Revised: 02/08/2019 Document Reviewed: 06/03/2017 Elsevier Patient Education  2020 [employer].  Low Back Sprain or Strain Rehab Ask your health care provider which exercises are safe for you. Do exercises exactly as told by your health care provider and adjust them as directed. It is normal to feel mild stretching, pulling, tightness, or discomfort as you do these exercises. Stop right away if you feel sudden pain or your pain gets worse. Do not begin these exercises until told by your health care provider. Stretching and range-of-motion exercises These exercises warm up your muscles and joints and improve the movement and flexibility of your back. These exercises also help to relieve pain, numbness, and tingling. Lumbar rotation  1. Lie on your back on a firm surface and bend your knees. 2. Straighten your arms out to your sides so each arm forms a 90-degree angle (right angle) with a side of your body. 3. Slowly move (rotate) both of your knees to one side of your body until you feel a stretch in your lower back (lumbar). Try  not to let your shoulders lift off the floor. 4. Hold this position for __________ seconds. 5. Tense your abdominal muscles and slowly move your knees back to the starting position. 6. Repeat this exercise on the other side of your body. Repeat __________ times. Complete this exercise __________ times a day. Single knee to chest  1. Lie on your back on a firm surface with both legs straight. 2. Bend one of your knees. Use your hands to move your knee up toward your chest until you feel a gentle stretch in your lower back and buttock. ? Hold your leg in this position by holding on to the front of your knee. ? Keep your other leg as straight as possible. 3. Hold this position for __________ seconds. 4. Slowly return to the starting position. 5. Repeat with your other leg. Repeat __________ times. Complete this exercise __________ times a day. Prone extension on  elbows  1. Lie on your abdomen on a firm surface (prone position). 2. Prop yourself up on your elbows. 3. Use your arms to help lift your chest up until you feel a gentle stretch in your abdomen and your lower back. ? This will place some of your body weight on your elbows. If this is uncomfortable, try stacking pillows under your chest. ? Your hips should stay down, against the surface that you are lying on. Keep your hip and back muscles relaxed. 4. Hold this position for __________ seconds. 5. Slowly relax your upper body and return to the starting position. Repeat __________ times. Complete this exercise __________ times a day. Strengthening exercises These exercises build strength and endurance in your back. Endurance is the ability to use your muscles for a long time, even after they get tired. Pelvic tilt This exercise strengthens the muscles that lie deep in the abdomen. 1. Lie on your back on a firm surface. Bend your knees and keep your feet flat on the floor. 2. Tense your abdominal muscles. Tip your pelvis up toward the  ceiling and flatten your lower back into the floor. ? To help with this exercise, you may place a small towel under your lower back and try to push your back into the towel. 3. Hold this position for __________ seconds. 4. Let your muscles relax completely before you repeat this exercise. Repeat __________ times. Complete this exercise __________ times a day. Alternating arm and leg raises  1. Get on your hands and knees on a firm surface. If you are on a hard floor, you may want to use padding, such as an exercise mat, to cushion your knees. 2. Line up your arms and legs. Your hands should be directly below your shoulders, and your knees should be directly below your hips. 3. Lift your left leg behind you. At the same time, raise your right arm and straighten it in front of you. ? Do not lift your leg higher than your hip. ? Do not lift your arm higher than your shoulder. ? Keep your abdominal and back muscles tight. ? Keep your hips facing the ground. ? Do not arch your back. ? Keep your balance carefully, and do not hold your breath. 4. Hold this position for __________ seconds. 5. Slowly return to the starting position. 6. Repeat with your right leg and your left arm. Repeat __________ times. Complete this exercise __________ times a day. Abdominal set with straight leg raise  1. Lie on your back on a firm surface. 2. Bend one of your knees and keep your other leg straight. 3. Tense your abdominal muscles and lift your straight leg up, 4-6 inches (10-15 cm) off the ground. 4. Keep your abdominal muscles tight and hold this position for __________ seconds. ? Do not hold your breath. ? Do not arch your back. Keep it flat against the ground. 5. Keep your abdominal muscles tense as you slowly lower your leg back to the starting position. 6. Repeat with your other leg. Repeat __________ times. Complete this exercise __________ times a day. Single leg lower with bent knees 1. Lie on  your back on a firm surface. 2. Tense your abdominal muscles and lift your feet off the floor, one foot at a time, so your knees and hips are bent in 90-degree angles (right angles). ? Your knees should be over your hips and your lower legs should be parallel to the floor. 3. Keeping your abdominal muscles tense  muscles tense and your knee bent, slowly lower one of your legs so your toe touches the ground. 4. Lift your leg back up to return to the starting position. ? Do not hold your breath. ? Do not let your back arch. Keep your back flat against the ground. 5. Repeat with your other leg. Repeat __________ times. Complete this exercise __________ times a day. Posture and body mechanics Good posture and healthy body mechanics can help to relieve stress in your body's tissues and joints. Body mechanics refers to the movements and positions of your body while you do your daily activities. Posture is part of body mechanics. Good posture means:  Your spine is in its natural S-curve position (neutral).  Your shoulders are pulled back slightly.  Your head is not tipped forward. Follow these guidelines to improve your posture and body mechanics in your everyday activities. Standing   When standing, keep your spine neutral and your feet about hip width apart. Keep a slight bend in your knees. Your ears, shoulders, and hips should line up.  When you do a task in which you stand in one place for a long time, place one foot up on a stable object that is 2-4 inches (5-10 cm) high, such as a footstool. This helps keep your spine neutral. Sitting   When sitting, keep your spine neutral and keep your feet flat on the floor. Use a footrest, if necessary, and keep your thighs parallel to the floor. Avoid rounding your shoulders, and avoid tilting your head forward.  When working at a desk or a computer, keep your desk at a height where your hands are slightly lower than your elbows. Slide your chair under your desk  so you are close enough to maintain good posture.  When working at a computer, place your monitor at a height where you are looking straight ahead and you do not have to tilt your head forward or downward to look at the screen. Resting  When lying down and resting, avoid positions that are most painful for you.  If you have pain with activities such as sitting, bending, stooping, or squatting, lie in a position in which your body does not bend very much. For example, avoid curling up on your side with your arms and knees near your chest (fetal position).  If you have pain with activities such as standing for a long time or reaching with your arms, lie with your spine in a neutral position and bend your knees slightly. Try the following positions: ? Lying on your side with a pillow between your knees. ? Lying on your back with a pillow under your knees. Lifting   When lifting objects, keep your feet at least shoulder width apart and tighten your abdominal muscles.  Bend your knees and hips and keep your spine neutral. It is important to lift using the strength of your legs, not your back. Do not lock your knees straight out.  Always ask for help to lift heavy or awkward objects. This information is not intended to replace advice given to you by your health care provider. Make sure you discuss any questions you have with your health care provider. Document Released: 10/20/2005 Document Revised: 02/11/2019 Document Reviewed: 11/11/2018 Elsevier Patient Education  2020 [employer] Inc[employer]  

## 2019-08-16 ENCOUNTER — Telehealth: Payer: Self-pay | Admitting: *Deleted

## 2019-08-16 ENCOUNTER — Encounter: Payer: Self-pay | Admitting: *Deleted

## 2019-08-16 DIAGNOSIS — I10 Essential (primary) hypertension: Secondary | ICD-10-CM

## 2019-08-16 DIAGNOSIS — Z951 Presence of aortocoronary bypass graft: Secondary | ICD-10-CM

## 2019-08-16 DIAGNOSIS — K6289 Other specified diseases of anus and rectum: Secondary | ICD-10-CM

## 2019-08-16 DIAGNOSIS — R197 Diarrhea, unspecified: Secondary | ICD-10-CM

## 2019-08-16 DIAGNOSIS — R198 Other specified symptoms and signs involving the digestive system and abdomen: Secondary | ICD-10-CM

## 2019-08-16 DIAGNOSIS — E782 Mixed hyperlipidemia: Secondary | ICD-10-CM

## 2019-08-16 MED ORDER — LOSARTAN POTASSIUM 50 MG PO TABS: 50.0000 mg | ORAL_TABLET | Freq: Every day | ORAL | 0 refills | Status: DC

## 2019-08-16 NOTE — Telephone Encounter (Signed)
Virtual Visit Pre-Appointment Phone Call  "(Name), I am calling you today to discuss your upcoming appointment. We are currently trying to limit exposure to the virus that causes COVID-19 by seeing patients at home rather than in the office."  1. "What is the BEST phone number to call the day of the visit?" - include this in appointment notes-YES UPDATED  2. Do you have or have access to (through a family member/friend) a smartphone with video capability that we can use for your visit?" a. If yes - list this number in appt notes as cell (if different from BEST phone #) and list the appointment type as a VIDEO visit in appointment notes-YES UPDATED   3. Confirm consent - "In the setting of the current Covid19 crisis, you are scheduled for a ( video) visit with your provider on 10/06/19 AT 1000.  Just as we do with many in-office visits, in order for you to participate in this visit, we must obtain consent.  If you'd like, I can send this to your mychart (if signed up) or email for you to review.  Otherwise, I can obtain your verbal consent now.  All virtual visits are billed to your insurance company just like a normal visit would be.  By agreeing to a virtual visit, we'd like you to understand that the technology does not allow for your provider to perform an examination, and thus may limit your provider's ability to fully assess your condition. If your provider identifies any concerns that need to be evaluated in person, we will make arrangements to do so.  Finally, though the technology is pretty good, we cannot assure that it will always work on either your or our end, and in the setting of a video visit, we may have to convert it to a phone-only visit.  In either situation, we cannot ensure that we have a secure connection.  Are you willing to proceed?" STAFF: Did the patient verbally acknowledge consent to telehealth visit? Document YES/NO here: PT GAVE VERBAL CONSENT TO TREAT AS WELL AS SENT  TO HER MYCHART TO REVIEW  4. Advise patient to be prepared - "Two hours prior to your appointment, go ahead and check your blood pressure, pulse, oxygen saturation, and your weight (if you have the equipment to check those) and write them all down. When your visit starts, your provider will ask you for this information. If you have an Apple Watch or Kardia device, please plan to have heart rate information ready on the day of your appointment. Please have a pen and paper handy nearby the day of the visit as well."--YES PT WILL DO  5. Give patient instructions for Doxy.me as below if video visit (depending on what platform provider is using)-YES AWARE  6. Inform patient they will receive a phone call 15 minutes prior to their appointment time (may be from unknown caller ID) so they should be prepared to [place] [name] has been deemed a candidate for a follow-up tele-health visit to limit community exposure during the Covid-19 pandemic. I spoke with the patient via phone to ensure availability of phone/video source, confirm preferred email & phone number, and discuss instructions and expectations.  I reminded [name] to be prepared with any vital sign and/or heart rhythm information that could potentially be obtained via home monitoring, at the time of her visit. I reminded [name] to expect a phone call prior  to her visit.  Amber Socks, LPN 67/[id number] 17:51 AM    IF USING DOXIMITY or DOXY.ME - The patient will receive a link just prior to their visit by text.--YES AWARE     FULL LENGTH CONSENT FOR TELE-HEALTH VISIT   I hereby voluntarily request, consent and authorize [name] and its employed or contracted physicians, physician assistants, nurse practitioners or other licensed health care professionals (the Practitioner), to provide me with telemedicine health care services (the Services") as deemed necessary  by the treating Practitioner. I acknowledge and consent to receive the Services by the Practitioner via telemedicine. I understand that the telemedicine visit will involve communicating with the Practitioner through live audiovisual communication technology and the disclosure of certain medical information by electronic transmission. I acknowledge that I have been given the opportunity to request an in-person assessment or other available alternative prior to the telemedicine visit and am voluntarily participating in the telemedicine visit.  I understand that I have the right to withhold or withdraw my consent to the use of telemedicine in the course of my care at any time, without affecting my right to future care or treatment, and that the Practitioner or I may terminate the telemedicine visit at any time. I understand that I have the right to inspect all information obtained and/or recorded in the course of the telemedicine visit and may receive copies of available information for a reasonable fee.  I understand that some of the potential risks of receiving the Services via telemedicine include:   Delay or interruption in medical evaluation due to technological equipment failure or disruption;  Information transmitted may not be sufficient (e.g. poor resolution of images) to allow for appropriate medical decision making by the Practitioner; and/or   In rare instances, security protocols could fail, causing a breach of personal health information.  Furthermore, I acknowledge that it is my responsibility to provide information about my medical history, conditions and care that is complete and accurate to the best of my ability. I acknowledge that Practitioner's advice, recommendations, and/or decision may be based on factors not within their control, such as incomplete or inaccurate data provided by me or distortions of diagnostic images or specimens that may result from electronic transmissions. I  understand that the practice of medicine is not an exact science and that Practitioner makes no warranties or guarantees regarding treatment outcomes. I acknowledge that I will receive a copy of this consent concurrently upon execution via email to the email address I last provided but may also request a printed copy by calling the office of [name].    I understand that my insurance will be billed for this visit.   I have read or had this consent read to me.  I understand the contents of this consent, which adequately explains the benefits and risks of the Services being provided via telemedicine.   I have been provided ample opportunity to ask questions regarding this consent and the Services and have had my questions answered to my satisfaction.  I give my informed consent for the services to be provided through the use of telemedicine in my medical care  By participating in this telemedicine visit I agree to the above.  PT GAVE VERBAL CONSENT TO TREAT FOR VIDEO VISIT WITH [name] ON 10/06/19

## 2019-08-16 NOTE — Telephone Encounter (Signed)
Talked to the pt and sent in her losartan 50 mg po daily for 90 day supply to [facility].  Scheduled the pt for her visit with Dr. Meda Coffee, as planned for October 06, 2019.  Pt will do a video visit with Dr. Meda Coffee on 10/06/19 at 1000.  Verbal consent obtained as well as consent to treat sent to her mychart to review.  Pt states she would rather have a video visit anyways, for she has pulmonary issues, and is high risk for catching covid.  Pt states she is doing well from a cardiac perspective.  Pt verbalized understanding and agrees with this plan. Pt was more than gracious for all the assistance provided.

## 2019-08-29 DIAGNOSIS — Z23 Encounter for immunization: Secondary | ICD-10-CM | POA: Diagnosis not present

## 2019-09-20 ENCOUNTER — Other Ambulatory Visit: Payer: Self-pay

## 2019-09-20 NOTE — Patient Outreach (Signed)
Incoming EMMI Prevent call. Referred to [facility] RN.

## 2019-09-21 ENCOUNTER — Encounter: Payer: Self-pay | Admitting: Internal Medicine

## 2019-09-29 ENCOUNTER — Other Ambulatory Visit: Payer: Self-pay | Admitting: Internal Medicine

## 2019-10-02 ENCOUNTER — Telehealth: Payer: Self-pay | Admitting: *Deleted

## 2019-10-02 NOTE — Telephone Encounter (Signed)
Caller states she tends to develop pneumonia quite often. She is on oxygen 24/7 and noticed she did develop a cough and does have green mucus. Was wondering if she could get an antibiotic before it gets worse

## 2019-10-03 ENCOUNTER — Encounter: Payer: Self-pay | Admitting: Internal Medicine

## 2019-10-03 NOTE — Telephone Encounter (Signed)
Spoke with patient. Per patient started coughing up green mucus on Saturday. Patient reports she missed the call from the after hours nurse on that day as well. Woke up this morning with a heavy feeling in her chest. Has been using her inhaler. Has not used her nebulizer. Advised patient she can go to an urgent care.  Patient declined and wants to do a virtual appointment with PCP. Virtual visit scheduled for 10/04/2019 at 3:30

## 2019-10-03 NOTE — Telephone Encounter (Signed)
Forwarding to PCP for approval  

## 2019-10-04 ENCOUNTER — Other Ambulatory Visit: Payer: Self-pay

## 2019-10-04 ENCOUNTER — Telehealth (INDEPENDENT_AMBULATORY_CARE_PROVIDER_SITE_OTHER): Payer: Medicare Other | Admitting: Internal Medicine

## 2019-10-04 ENCOUNTER — Ambulatory Visit (HOSPITAL_COMMUNITY)
Admission: EM | Admit: 2019-10-04 | Discharge: 2019-10-04 | Disposition: A | Discharge status: Home / Self Care | Payer: Medicare Other | Attending: Family Medicine | Admitting: Family Medicine

## 2019-10-04 ENCOUNTER — Ambulatory Visit (INDEPENDENT_AMBULATORY_CARE_PROVIDER_SITE_OTHER): Payer: Medicare Other

## 2019-10-04 ENCOUNTER — Encounter (HOSPITAL_COMMUNITY): Payer: Self-pay

## 2019-10-04 DIAGNOSIS — L4050 Arthropathic psoriasis, unspecified: Secondary | ICD-10-CM | POA: Diagnosis not present

## 2019-10-04 DIAGNOSIS — K219 Gastro-esophageal reflux disease without esophagitis: Secondary | ICD-10-CM | POA: Insufficient documentation

## 2019-10-04 DIAGNOSIS — K2270 Barrett's esophagus without dysplasia: Secondary | ICD-10-CM | POA: Insufficient documentation

## 2019-10-04 DIAGNOSIS — Z9981 Dependence on supplemental oxygen: Secondary | ICD-10-CM | POA: Diagnosis not present

## 2019-10-04 DIAGNOSIS — Z20822 Contact with and (suspected) exposure to covid-19: Secondary | ICD-10-CM

## 2019-10-04 DIAGNOSIS — I5033 Acute on chronic diastolic (congestive) heart failure: Secondary | ICD-10-CM | POA: Diagnosis not present

## 2019-10-04 DIAGNOSIS — I110 Hypertensive heart disease with heart failure: Secondary | ICD-10-CM | POA: Insufficient documentation

## 2019-10-04 DIAGNOSIS — R0602 Shortness of breath: Secondary | ICD-10-CM | POA: Diagnosis not present

## 2019-10-04 DIAGNOSIS — Z8249 Family history of ischemic heart disease and other diseases of the circulatory system: Secondary | ICD-10-CM | POA: Diagnosis not present

## 2019-10-04 DIAGNOSIS — Z8673 Personal history of transient ischemic attack (TIA), and cerebral infarction without residual deficits: Secondary | ICD-10-CM | POA: Diagnosis not present

## 2019-10-04 DIAGNOSIS — Z79899 Other long term (current) drug therapy: Secondary | ICD-10-CM | POA: Diagnosis not present

## 2019-10-04 DIAGNOSIS — J441 Chronic obstructive pulmonary disease with (acute) exacerbation: Secondary | ICD-10-CM

## 2019-10-04 DIAGNOSIS — E039 Hypothyroidism, unspecified: Secondary | ICD-10-CM | POA: Insufficient documentation

## 2019-10-04 DIAGNOSIS — E785 Hyperlipidemia, unspecified: Secondary | ICD-10-CM | POA: Insufficient documentation

## 2019-10-04 DIAGNOSIS — Z7982 Long term (current) use of aspirin: Secondary | ICD-10-CM | POA: Diagnosis not present

## 2019-10-04 DIAGNOSIS — I252 Old myocardial infarction: Secondary | ICD-10-CM | POA: Insufficient documentation

## 2019-10-04 DIAGNOSIS — Z8719 Personal history of other diseases of the digestive system: Secondary | ICD-10-CM | POA: Insufficient documentation

## 2019-10-04 DIAGNOSIS — J8410 Pulmonary fibrosis, unspecified: Secondary | ICD-10-CM | POA: Diagnosis not present

## 2019-10-04 DIAGNOSIS — Z951 Presence of aortocoronary bypass graft: Secondary | ICD-10-CM | POA: Diagnosis not present

## 2019-10-04 DIAGNOSIS — Z87891 Personal history of nicotine dependence: Secondary | ICD-10-CM | POA: Insufficient documentation

## 2019-10-04 DIAGNOSIS — J449 Chronic obstructive pulmonary disease, unspecified: Secondary | ICD-10-CM | POA: Diagnosis not present

## 2019-10-04 DIAGNOSIS — F332 Major depressive disorder, recurrent severe without psychotic features: Secondary | ICD-10-CM | POA: Insufficient documentation

## 2019-10-04 DIAGNOSIS — I2510 Atherosclerotic heart disease of native coronary artery without angina pectoris: Secondary | ICD-10-CM | POA: Diagnosis not present

## 2019-10-04 DIAGNOSIS — J9611 Chronic respiratory failure with hypoxia: Secondary | ICD-10-CM | POA: Insufficient documentation

## 2019-10-04 DIAGNOSIS — Z20828 Contact with and (suspected) exposure to other viral communicable diseases: Secondary | ICD-10-CM | POA: Diagnosis not present

## 2019-10-04 DIAGNOSIS — M1711 Unilateral primary osteoarthritis, right knee: Secondary | ICD-10-CM | POA: Diagnosis not present

## 2019-10-04 MED ORDER — METHYLPREDNISOLONE ACETATE 40 MG/ML IJ SUSP
40.0000 mg | Freq: Once | INTRAMUSCULAR | Status: AC
  Administered 2019-10-04: 40 mg via INTRAMUSCULAR

## 2019-10-04 MED ORDER — METHYLPREDNISOLONE ACETATE 40 MG/ML IJ SUSP
INTRAMUSCULAR | Status: AC
  Filled 2019-10-04: qty 1

## 2019-10-04 NOTE — Progress Notes (Signed)
Virtual Visit via Telephone Note  I connected with [name] on 10/04/19 at  3:30 PM EST by telephone and verified that I am speaking with the correct person using two identifiers.   I discussed the limitations, risks, security and privacy concerns of performing an evaluation and management service by telephone and the availability of in person appointments. I also discussed with the patient that there may be a patient responsible charge related to this service. The patient expressed understanding and agreed to proceed.  Location patient: home Location provider: work office Participants present for the call: patient, provider Patient did not have a visit in the prior 7 days to address this/these issue(s).   History of Present Illness:  She has scheduled this visit to discuss some upper respiratory symptoms.  She has a known history of severe COPD on 2 L of chronic oxygen followed by Dr. Sherene Sires.  She is very hesitant to seek care in the environment of the current pandemic.  She spent Wednesday through Friday at her daughter's house for the Thanksgiving holiday (not a direct household contact).  On Saturday she started noticing her symptoms, which included a feeling of extreme fatigue and "laziness", increased wheezing, she has felt shaky with watery eyes, has had a cough productive of greenish sputum, she also has increased dyspnea and feels "like a weight is on her chest".  She has started Plan C which is the action plan that her pulmonologist has given her for COPD which includes nebulized albuterol, she has been using this 2 or 3 times a day which helped with her symptoms but did not completely relieve it.  She has increased her oxygen from 2 to 3 L since then.  She has been checking her temperature routinely the highest is 98.3.   Observations/Objective: Patient sounds cheerful and well on the phone. I do not appreciate any increased work of breathing. Speech and thought  processing are grossly intact. Patient reported vitals: 1 hour before our call: Temperature 98.3, blood pressure 148/78, heart rate fluctuating between 86 and 94, O2 sats 95% on 3 L (she is usually on 2 L)   Current Outpatient Medications:  .  acetaminophen (TYLENOL ARTHRITIS PAIN) 650 MG CR tablet, Take 1,300 mg by mouth 2 (two) times daily. , Disp: , Rfl:  .  albuterol (PROAIR HFA) 108 (90 Base) MCG/ACT inhaler, 2 puffs every 4 hours as needed only  if your can't catch your breath (Patient taking differently: Inhale 2 puffs into the lungs every 4 (four) hours as needed for shortness of breath (if you can't catch your breath). ), Disp: 1 Inhaler, Rfl: 11 .  aspirin 81 MG tablet, Take 1 tablet (81 mg total) by mouth daily., Disp: 30 tablet, Rfl: 0 .  atorvastatin (LIPITOR) 40 MG tablet, TAKE 1 TABLET BY MOUTH  DAILY AT 6 PM. Please make overdue appt with Dr. Delton See before anymore refills. 2nd attempt, Disp: 15 tablet, Rfl: 0 .  CALCIUM PO, Take 1 tablet by mouth daily., Disp: , Rfl:  .  Cholecalciferol (VITAMIN D3) 2000 UNITS TABS, Take 2,000 Int'l Units by mouth daily., Disp: , Rfl:  .  citalopram (CELEXA) 40 MG tablet, TAKE 1 TABLET BY MOUTH IN  THE MORNING, Disp: 90 tablet, Rfl: 3 .  clonazePAM (KLONOPIN) 0.5 MG tablet, Take 0.5 tablets (0.25 mg total) by mouth at bedtime as needed for anxiety., Disp: 20 tablet, Rfl: 1 .  cyclobenzaprine (FLEXERIL) 5 MG tablet, Take 1 tablet (5 mg  total) by mouth at bedtime as needed for muscle spasms., Disp: 20 tablet, Rfl: 0 .  furosemide (LASIX) 20 MG tablet, TAKE 1 TABLET BY MOUTH TWO  TIMES DAILY (Patient taking differently: Take 20 mg by mouth 2 (two) times daily. ), Disp: 180 tablet, Rfl: 2 .  golimumab (SIMPONI ARIA) 50 MG/4ML SOLN injection, Inject 50 mg into the vein every 8 (eight) weeks. , Disp: , Rfl:  .  leflunomide (ARAVA) 20 MG tablet, Take 1 tablet (20 mg total) by mouth daily., Disp: , Rfl:  .  levothyroxine (SYNTHROID) 100 MCG tablet, Take 1  tablet (100 mcg total) by mouth daily., Disp: 90 tablet, Rfl: 1 .  losartan (COZAAR) 50 MG tablet, Take 1 tablet (50 mg total) by mouth daily., Disp: 90 tablet, Rfl: 0 .  montelukast (SINGULAIR) 10 MG tablet, Take 1 tablet (10 mg total) by mouth at bedtime., Disp: 90 tablet, Rfl: 3 .  OXYGEN, Inhale 2 L into the lungs continuous. continuous o2 , Disp: , Rfl:  .  pantoprazole (PROTONIX) 40 MG tablet, TAKE 1 TABLET BY MOUTH  DAILY BEFORE BREAKFAST, Disp: 90 tablet, Rfl: 3 .  SYMBICORT 160-4.5 MCG/ACT inhaler, USE 2 PUFFS BY MOUTH TWO  TIMES DAILY (Patient taking differently: Inhale 2 puffs into the lungs 2 (two) times a day. ), Disp: 30.6 g, Rfl: 1 .  Tiotropium Bromide Monohydrate (SPIRIVA RESPIMAT) 2.5 MCG/ACT AERS, 2 puffs each am, Disp: 12 g, Rfl: 3 .  traMADol (ULTRAM) 50 MG tablet, TAKE 1-2 TABLETS EVERY 6 HOURS AS NEEDED FOR PAIN (Patient taking differently: Take 50 mg by mouth 2 (two) times daily. ), Disp: 90 tablet, Rfl: 0 .  triamcinolone cream (KENALOG) 0.1 %, Apply 1 application topically 2 (two) times daily as needed (for psoriasis). , Disp: , Rfl:  .  predniSONE (STERAPRED UNI-PAK 21 TAB) 10 MG (21) TBPK tablet, Take as directed, Disp: 21 tablet, Rfl: 0 .  saccharomyces boulardii (FLORASTOR) 250 MG capsule, Take 1 capsule (250 mg total) by mouth 2 (two) times daily. (Patient not taking: Reported on 10/04/2019), Disp: 180 capsule, Rfl: 1  Review of Systems:  Constitutional: Positive for chills,  appetite change and fatigue.  HEENT: Denies photophobia, eye pain, redness, hearing loss, ear pain, , sneezing, mouth sores, trouble swallowing, neck pain, neck stiffness and tinnitus.   Respiratory: Positive for SOB, DOE, cough, chest tightness,  and wheezing.   Cardiovascular: Denies chest pain, palpitations and leg swelling.  Gastrointestinal: Denies nausea, vomiting, abdominal pain, diarrhea, constipation, blood in stool and abdominal distention.  Genitourinary: Denies dysuria, urgency,  frequency, hematuria, flank pain and difficulty urinating.  Endocrine: Denies: hot or cold intolerance, sweats, changes in hair or nails, polyuria, polydipsia. Musculoskeletal: Denies myalgias, back pain, joint swelling, arthralgias and gait problem.  Skin: Denies pallor, rash and wound.  Neurological: Denies dizziness, seizures, syncope,  light-headedness, numbness and headaches.  Hematological: Denies adenopathy. Easy bruising, personal or family bleeding history  Psychiatric/Behavioral: Denies suicidal ideation, mood changes, confusion, nervousness, sleep disturbance and agitation   Assessment and Plan:  Encounter by telehealth for suspected COVID-19 Chronic obstructive pulmonary disease, unspecified COPD type (HCC)  -Given her symptoms, I am concerned for COPD exacerbation, possibly pneumonia and/or COVID-19 infection. -I do believe she needs an in person evaluation with lung auscultation and a chest x-ray as well as COVID-19 testing. -She will go to urgent care tonight.  She will have her daughter drive her.    I discussed the assessment and treatment plan with the patient.  The patient was provided an opportunity to ask questions and all were answered. The patient agreed with the plan and demonstrated an understanding of the instructions.   The patient was advised to call back or seek an in-person evaluation if the symptoms worsen or if the condition fails to improve as anticipated.  I provided 16 minutes of non-face-to-face time during this encounter.   Lelon Frohlich, MD [facility]

## 2019-10-04 NOTE — ED Triage Notes (Signed)
Pt presents to UC w/ c/o sob since 3 days. Pt states she feels pressure in middle of chest and feels fatigued when walking short distances. Pt states she has congestion and productive cough. Denies fevers.

## 2019-10-04 NOTE — ED Provider Notes (Signed)
[facility]    CSN: [id number] Arrival date & time: 10/04/19  1831      History   Chief Complaint Chief Complaint  Patient presents with  . SOB/chest pressure    HPI [name] is a 73 y.o. female.   Patient with known history of COPD and coronary artery disease status post CABG.  For the past few days she has had increasing shortness of breath related to exertion.  Her cough is productive, but it always is.  Sputum had changed to green but is now back to clear or white with addition of Mucinex.  She also endorses some substernal chest pressure with exertion.  Denies any fever.  Denies exposure to Covid and says that she is not worried about that possibility  HPI  Past Medical History:  Diagnosis Date  . Acute bronchitis 09/25/2015  . ACUTE ON CHRONIC DIASTOLIC HEART FAILURE 12/04/2010   Qualifier: Diagnosis of  By: Ladona Ridgelaylor, MD, [facility], Vergia AlconGregg William   . Arthritis    OA RIGHT KNEE WITH PAIN  . Barrett esophagus   . Bradycardia 06/01/2015  . CHF (congestive heart failure) (HCC)   . Chronic respiratory failure (HCC)   . Chronic respiratory failure with hypoxia and hypercapnia (HCC) 02/04/2010   Followed in Pulmonary clinic/ [facility]/ [facility]       - 02 dependent  since 07/02/10 >>  83% RA December 05, 2010       - ONO RA 08/05/12  :  Positive sat < 89 x 2:5821m> repeat on 2lpm rec 08/12/2012  - 06/17/2013 reported desat with activity p Knee surgery > rec restart 2lpm with activity  - 06/27/2013   Walked 2lpm  x one lap @ 185 stopped due to sat 88% not sob , desat to 82% on RA just at th  . COPD (chronic obstructive pulmonary disease) (HCC)   . COPD III spirometry if use FEV1/VC p saba  07/18/2010   Quit smoking May 2006       - PFT's  04/12/10 FEV1  1.21 (69%) ratio 77 and no change p B2,  DLC0 56%   VC 70%         - PFTs  08/08/2013 FEV1 1.21 (60%) ratio 86 and no change p B2 DLCO 79%  VC 72%  On symbicort 160 2bid  - PFT's  02/08/2018  FEV1 0.70 (40 % ) ratio 56 if use  FEV1/VC  p 38 % improvement from saba p symb 160 prior to study with DLCO  78 % corrects to 147  % for alv volume   - 02/08/2018  . Cough variant asthma 02/26/2011   Followed in Pulmonary clinic/ [facility]/ [facility]  - PFT's  06/04/15  FEV1 1.20 (67 % ) ratio 83  p 6 % improvement from saba with DLCO  80 % corrects to 132 % for alv volume      - Clinical dx based on response to symbicort       FENO 09/16/2016  =   96 on symbicort 160 2bid > added singulair  Allergy profile 09/16/2016 >  Eos 0.5 /  IgE  78 neg RAST  -  Referred to rehab 04/29/2017 > completed  . DOE (dyspnea on exertion) 02/19/2016  . Essential hypertension 04/20/2007   Qualifier: Diagnosis of  By: Marcelyn DittyMondi, RN, Katy FitchJudy Ann   . GERD (gastroesophageal reflux disease)   . History of ARDS 2006  . History of home oxygen therapy    AT  NIGHT WHEN SLEEPING 2 L / MIN NASAL CANNULA  . Hyperlipidemia 07/12/2015  . Hypertension   . Hypothyroidism   . Morbid (severe) obesity due to excess calories ([place]) 04/22/2015   pfts with erv 14% 06/04/15  And 33% 02/08/2018   . NSTEMI (non-ST elevated myocardial infarction) ([place]) 05/31/2015  . Pneumococcal pneumonia ([place]) 2006   HOSPITALIZED AND DEVELOPED ARDS  . Psoriatic arthritis ([place])   . Pulmonary fibrosis ([place])   . PULMONARY FIBROSIS ILD POST INFLAMMATORY CHRONIC 07/18/2010   Followed as Primary Care Patient/ [facility]/ [facility]  -s/p ARDS 2006 with bacteremic S  Pna       - CT chest 07/03/10 Nonspecific PF mostly upper lobes       - CT chest 12/03/10 acute gg changes and effusions c/w chf - PFT's  02/08/2018  FVC 0.64 (28 %)   with DLCO  78 % corrects to 147 % for alv volume     . Rheumatic disease   . 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 SOB (shortness of breath) on exertion   . Stroke [facility])     Patient Active Problem List   Diagnosis Date Noted  . Chronic respiratory failure with hypoxia and hypercapnia ([place]) 04/20/2019  . COPD exacerbation ([place]) 03/10/2019  . Acute on chronic respiratory failure with hypoxia  ([place]) 03/10/2019  . Severe episode of recurrent major depressive disorder, without psychotic features ([place]) 11/09/2018  . COPD (chronic obstructive pulmonary disease) ([place]) 11/02/2018  . COPD with acute exacerbation ([place]) 11/01/2018  . CAP (community acquired pneumonia) 10/28/2018  . ICH (intracerebral hemorrhage) ([place]) 08/25/2018  . Hyperlipidemia 07/12/2015  . 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 Bradycardia 06/01/2015  . NSTEMI (non-ST elevated myocardial infarction) ([place]) 05/31/2015  . Morbid (severe) obesity due to excess calories ([place]) 04/22/2015  . Postoperative anemia due to acute blood loss 06/08/2013  . History of home oxygen therapy 06/07/2013  . GERD (gastroesophageal reflux disease) 06/07/2013  . Barrett's esophagus 06/07/2013  . OA (osteoarthritis) of knee 06/06/2013  . ACUTE ON CHRONIC DIASTOLIC HEART FAILURE 64/40/3474  . COPD III spirometry if use FEV1/VC p saba  07/18/2010  . 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 Compression fracture of thoracic vertebra ([place]) 07/02/2010  . Hypothyroidism 04/20/2007  . Essential hypertension 04/20/2007  . PSORIATIC ARTHRITIS 04/20/2007    Past Surgical History:  Procedure Laterality Date  . ABDOMINOPLASTY    . CARDIAC CATHETERIZATION N/A 06/01/2015   Procedure: Left Heart Cath and Coronary Angiography;  Surgeon: Lorretta Harp, MD;  Location: [facility];  Service: Cardiovascular;  Laterality: N/A;  . CARPAL TUNNEL RELEASE    . CHOLECYSTECTOMY    . CORONARY ARTERY BYPASS GRAFT N/A 06/04/2015   Procedure: CORONARY ARTERY BYPASS GRAFT times three            with left internal mammary artery and right leg saphenous vein;  Surgeon: Gaye Pollack, MD;  Location: [facility];  Service: Open Heart Surgery;  Laterality: N/A;  . cosmetic breast surgery    . JOINT REPLACEMENT    . KNEE ARTHROSCOPY Left   . TEE WITHOUT CARDIOVERSION  06/04/2015   Procedure: TRANSESOPHAGEAL ECHOCARDIOGRAM (TEE);  Surgeon: Gaye Pollack, MD;   Location: [facility];  Service: Open Heart Surgery;;  . TOTAL KNEE ARTHROPLASTY Left   . TOTAL KNEE ARTHROPLASTY Right 06/06/2013   Procedure: RIGHT TOTAL KNEE ARTHROPLASTY;  Surgeon: Gearlean Alf, MD;  Location: [facility];  Service: Orthopedics;  Laterality:  Right;    OB History   No obstetric history on file.      Home Medications    Prior to Admission medications   Medication Sig Start Date End Date Taking? Authorizing Provider  acetaminophen (TYLENOL ARTHRITIS PAIN) 650 MG CR tablet Take 1,300 mg by mouth 2 (two) times daily.     [provider]  albuterol (PROAIR HFA) 108 (90 Base) MCG/ACT inhaler 2 puffs every 4 hours as needed only  if your can't catch your breath Patient taking differently: Inhale 2 puffs into the lungs every 4 (four) hours as needed for shortness of breath (if you can't catch your breath).  07/22/16   Nyoka Cowden, MD  aspirin 81 MG tablet Take 1 tablet (81 mg total) by mouth daily. 11/08/18   Ihor Austin, NP  atorvastatin (LIPITOR) 40 MG tablet TAKE 1 TABLET BY MOUTH  DAILY AT 6 PM. Please make overdue appt with Dr. Delton See before anymore refills. 2nd attempt 08/02/19   Lars Masson, MD  CALCIUM PO Take 1 tablet by mouth daily.    [provider]  Cholecalciferol (VITAMIN D3) 2000 UNITS TABS Take 2,000 Int'l Units by mouth daily.    [provider]  citalopram (CELEXA) 40 MG tablet TAKE 1 TABLET BY MOUTH IN  THE MORNING 10/04/19   [name], [name], MD  clonazePAM (KLONOPIN) 0.5 MG tablet Take 0.5 tablets (0.25 mg total) by mouth at bedtime as needed for anxiety. 06/17/19   [name], [name], MD  cyclobenzaprine (FLEXERIL) 5 MG tablet Take 1 tablet (5 mg total) by mouth at bedtime as needed for muscle spasms. 08/02/19   [name], [name], MD  furosemide (LASIX) 20 MG tablet TAKE 1 TABLET BY MOUTH TWO  TIMES DAILY Patient taking differently: Take 20 mg by mouth 2 (two) times daily.  02/11/19   Lars Masson,  MD  golimumab (SIMPONI ARIA) 50 MG/4ML SOLN injection Inject 50 mg into the vein every 8 (eight) weeks.     [provider]  leflunomide (ARAVA) 20 MG tablet Take 1 tablet (20 mg total) by mouth daily. 03/15/19   [name], [name], MD  levothyroxine (SYNTHROID) 100 MCG tablet Take 1 tablet (100 mcg total) by mouth daily. 07/15/19   [name], [name], MD  losartan (COZAAR) 50 MG tablet Take 1 tablet (50 mg total) by mouth daily. 08/16/19   Lars Masson, MD  montelukast (SINGULAIR) 10 MG tablet Take 1 tablet (10 mg total) by mouth at bedtime. 07/13/19   Nyoka Cowden, MD  OXYGEN Inhale 2 L into the lungs continuous. continuous o2     [provider]  pantoprazole (PROTONIX) 40 MG tablet TAKE 1 TABLET BY MOUTH  DAILY BEFORE BREAKFAST 07/13/19   Nyoka Cowden, MD  predniSONE (STERAPRED UNI-PAK 21 TAB) 10 MG (21) TBPK tablet Take as directed 08/02/19   [name], [name], MD  saccharomyces boulardii (FLORASTOR) 250 MG capsule Take 1 capsule (250 mg total) by mouth 2 (two) times daily. Patient not taking: Reported on 10/04/2019 06/07/19   [name], [name], MD  SYMBICORT 160-4.5 MCG/ACT inhaler USE 2 PUFFS BY MOUTH TWO  TIMES DAILY Patient taking differently: Inhale 2 puffs into the lungs 2 (two) times a day.  02/07/19   Lars Masson, MD  Tiotropium Bromide Monohydrate (SPIRIVA RESPIMAT) 2.5 MCG/ACT AERS 2 puffs each am 05/27/19   Nyoka Cowden, MD  traMADol (ULTRAM) 50 MG tablet TAKE  1-2 TABLETS EVERY 6 HOURS AS NEEDED FOR PAIN Patient taking differently: Take 50 mg by mouth 2 (two) times daily.  07/15/17   Gordy Savers, MD  triamcinolone cream (KENALOG) 0.1 % Apply 1 application topically 2 (two) times daily as needed (for psoriasis).     [provider]    Family History Family History  Problem Relation Age of Onset  . Breast cancer Mother   . Coronary artery disease Father   . Rheum arthritis Father     Social History  Social History   Tobacco Use  . Smoking status: Former Smoker    Packs/day: 1.50    Years: 58.50    Pack years: 87.75    Types: Cigarettes    Quit date: 03/03/2005    Years since quitting: 14.5  . Smokeless tobacco: Never Used  Substance Use Topics  . Alcohol use: No  . Drug use: No     Allergies   Patient has no known allergies.   Review of Systems Review of Systems  Respiratory: Positive for chest tightness and wheezing.   Cardiovascular: Positive for chest pain.  All other systems reviewed and are negative.    Physical Exam Triage Vital Signs ED Triage Vitals  Enc Vitals Group     BP 10/04/19 1834 (!) 155/89     Pulse Rate 10/04/19 1834 97     Resp --      Temp 10/04/19 1834 98.9 F (37.2 C)     Temp src --      SpO2 10/04/19 1834 97 %     Weight --      Height --      Head Circumference --      Peak Flow --      Pain Score 10/04/19 1838 0     Pain Loc --      Pain Edu? --      Excl. in GC? --    No data found.  Updated Vital Signs BP (!) 155/89 (BP Location: Right Arm)   Pulse 97   Temp 98.9 F (37.2 C)   SpO2 97%   Visual Acuity Right Eye Distance:   Left Eye Distance:   Bilateral Distance:    Right Eye Near:   Left Eye Near:    Bilateral Near:     Physical Exam Vitals signs and nursing note reviewed.  Constitutional:      Appearance: Normal appearance.     Comments: Talks easily and is in no acute distress.  There is no dyspnea with conversation  HENT:     Head: Normocephalic.     Mouth/Throat:     Mouth: Mucous membranes are moist.     Pharynx: Oropharynx is clear.  Neck:     Musculoskeletal: Normal range of motion.  Cardiovascular:     Rate and Rhythm: Normal rate and regular rhythm.  Pulmonary:     Effort: Pulmonary effort is normal.     Breath sounds: Normal breath sounds.  Neurological:     General: No focal deficit present.     Mental Status: She is alert and oriented to person, place, and time.    Chest x-ray shows  no significant change from last study  UC Treatments / Results  Labs (all labs ordered are listed, but only abnormal results are displayed) Labs Reviewed - No data to display  EKG   Radiology No results found.  Procedures Procedures (including critical care time)  Medications Ordered in UC Medications -  No data to display  Initial Impression / Assessment and Plan / UC Course  I have reviewed the triage vital signs and the nursing notes.  Pertinent labs & imaging results that were available during my care of the patient were reviewed by me and considered in my medical decision making (see chart for details).     COPD exacerbation Final Clinical Impressions(s) / UC Diagnoses   Final diagnoses:  None   Discharge Instructions   None    ED Prescriptions    None     PDMP not reviewed this encounter.   Frederica KusterMiller, Tristian Sickinger M, MD 10/04/19 2027

## 2019-10-06 ENCOUNTER — Encounter: Payer: Self-pay | Admitting: *Deleted

## 2019-10-06 ENCOUNTER — Other Ambulatory Visit: Payer: Self-pay

## 2019-10-06 ENCOUNTER — Encounter: Payer: Self-pay | Admitting: Cardiology

## 2019-10-06 ENCOUNTER — Telehealth (INDEPENDENT_AMBULATORY_CARE_PROVIDER_SITE_OTHER): Payer: Medicare Other | Admitting: Cardiology

## 2019-10-06 DIAGNOSIS — I10 Essential (primary) hypertension: Secondary | ICD-10-CM

## 2019-10-06 DIAGNOSIS — E785 Hyperlipidemia, unspecified: Secondary | ICD-10-CM

## 2019-10-06 DIAGNOSIS — I5033 Acute on chronic diastolic (congestive) heart failure: Secondary | ICD-10-CM

## 2019-10-06 DIAGNOSIS — J9621 Acute and chronic respiratory failure with hypoxia: Secondary | ICD-10-CM

## 2019-10-06 DIAGNOSIS — I214 Non-ST elevation (NSTEMI) myocardial infarction: Secondary | ICD-10-CM | POA: Diagnosis not present

## 2019-10-06 MED ORDER — DILTIAZEM HCL ER COATED BEADS 120 MG PO CP24: 120.0000 mg | ORAL_CAPSULE | Freq: Every day | ORAL | 1 refills | Status: DC

## 2019-10-06 NOTE — Progress Notes (Signed)
Virtual Visit via Video Note   This visit type was conducted due to national recommendations for restrictions regarding the COVID-19 Pandemic (e.g. social distancing) in an effort to limit this patient's exposure and mitigate transmission in our community.  Due to her co-morbid illnesses, this patient is at least at moderate risk for complications without adequate follow up.  This format is felt to be most appropriate for this patient at this time.  All issues noted in this document were discussed and addressed.  A limited physical exam was performed with this format.  Please refer to the patient's chart for her consent to telehealth for [facility].   Date:  10/06/2019   ID:  [name], DOB 15-Jun-1946, MRN [id number]  Patient Location: Home Provider Location: Office  PCP:  [name], [name], MD  Cardiologist:  Tobias Alexander, MD  Electrophysiologist:  None   Evaluation Performed:  Follow-Up Visit  Chief Complaint:  SOB  History of Present Illness:    [name] is a 73 y.o. female with h/o NSTEMI--> 3 VD --> emergent CABG in July 2016 when her husband was critically ill and eventually passed away.  She started to use her home oxygen in 2017, she follows with Dr. Sherene Sires for interstitial lung disease and pulmonary fibrosis, currently on 2 L of oxygen.   06/23/2018 - 6 months follow up, no chest pain, stable DOE, walking with 24/7 oxygen, follows with pulmonary, denies any lower extremity edema palpitations or claudications.  She is tolerating her medications well.  Her complaint is constant urge to go to the bathroom for a #2 and ongoing diarrhea for several months.  Also rectal pain.  10/06/2019 - 1 year follow up, she has been doing okay except she has been having problems with her COPD.  Just 2 days ago she went to urgent care with COPD exacerbation.  I have reviewed her x-ray that does not show any signs of CHF.  She denies any chest pain.  She has been  compliant with her meds and has no side effects.  Her blood pressure has been elevated.  She denies any orthopnea or proximal nocturnal dyspnea.  The patient does not have symptoms concerning for COVID-19 infection (fever, chills, cough, or new shortness of breath).   Past Medical History:  Diagnosis Date  . Acute bronchitis 09/25/2015  . ACUTE ON CHRONIC DIASTOLIC HEART FAILURE 12/04/2010   Qualifier: Diagnosis of  By: Ladona Ridgel, MD, [facility], [place]ergia Alcon   . Arthritis    OA RIGHT KNEE WITH PAIN  . Barrett esophagus   . Bradycardia 06/01/2015  . CHF (congestive heart failure) (HCC)   . Chronic respiratory failure (HCC)   . Chronic respiratory failure with hypoxia and hypercapnia (HCC) 02/04/2010   Followed in Pulmonary clinic/ [facility]/ [facility]       - 02 dependent  since 07/02/10 >>  83% RA December 05, 2010       - ONO RA 08/05/12  :  Positive sat < 89 x 2:22m> repeat on 2lpm rec 08/12/2012  - 06/17/2013 reported desat with activity p Knee surgery > rec restart 2lpm with activity  - 06/27/2013   Walked 2lpm  x one lap @ 185 stopped due to sat 88% not sob , desat to 82% on RA just at th  . COPD (chronic obstructive pulmonary disease) (HCC)   . COPD III spirometry if use FEV1/VC p saba  07/18/2010   Quit smoking May 2006       -  PFT's  04/12/10 FEV1  1.21 (69%) ratio 77 and no change p B2,  DLC0 56%   VC 70%         - PFTs  08/08/2013 FEV1 1.21 (60%) ratio 86 and no change p B2 DLCO 79%  VC 72%  On symbicort 160 2bid  - PFT's  02/08/2018  FEV1 0.70 (40 % ) ratio 56 if use FEV1/VC  p 38 % improvement from saba p symb 160 prior to study with DLCO  78 % corrects to 147  % for alv volume   - 02/08/2018  . Cough variant asthma 02/26/2011   Followed in Pulmonary clinic/ [facility]/ [facility]  - PFT's  06/04/15  FEV1 1.20 (67 % ) ratio 83  p 6 % improvement from saba with DLCO  80 % corrects to 132 % for alv volume      - Clinical dx based on response to symbicort       FENO 09/16/2016  =   96 on symbicort 160 2bid  > added singulair  Allergy profile 09/16/2016 >  Eos 0.5 /  IgE  78 neg RAST  -  Referred to rehab 04/29/2017 > completed  . DOE (dyspnea on exertion) 02/19/2016  . Essential hypertension 04/20/2007   Qualifier: Diagnosis of  By: Marcelyn Ditty RN, Katy Fitch   . GERD (gastroesophageal reflux disease)   . History of ARDS 2006  . History of home oxygen therapy    AT NIGHT WHEN SLEEPING 2 L / MIN NASAL CANNULA  . Hyperlipidemia 07/12/2015  . Hypertension   . Hypothyroidism   . Morbid (severe) obesity due to excess calories (HCC) 04/22/2015   pfts with erv 14% 06/04/15  And 33% 02/08/2018   . NSTEMI (non-ST elevated myocardial infarction) (HCC) 05/31/2015  . Pneumococcal pneumonia (HCC) 2006   HOSPITALIZED AND DEVELOPED ARDS  . Psoriatic arthritis (HCC)   . Pulmonary fibrosis (HCC)   . PULMONARY FIBROSIS ILD POST INFLAMMATORY CHRONIC 07/18/2010   Followed as Primary Care Patient/ [facility]/ [facility]  -s/p ARDS 2006 with bacteremic S  Pna       - CT chest 07/03/10 Nonspecific PF mostly upper lobes       - CT chest 12/03/10 acute gg changes and effusions c/w chf - PFT's  02/08/2018  FVC 0.64 (28 %)   with DLCO  78 % corrects to 147 % for alv volume     . Rheumatic disease   . 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 SOB (shortness of breath) on exertion   . Stroke [facility])    Past Surgical History:  Procedure Laterality Date  . ABDOMINOPLASTY    . CARDIAC CATHETERIZATION N/A 06/01/2015   Procedure: Left Heart Cath and Coronary Angiography;  Surgeon: Runell Gess, MD;  Location: [facility] INVASIVE CV LAB;  Service: Cardiovascular;  Laterality: N/A;  . CARPAL TUNNEL RELEASE    . CHOLECYSTECTOMY    . CORONARY ARTERY BYPASS GRAFT N/A 06/04/2015   Procedure: CORONARY ARTERY BYPASS GRAFT times three            with left internal mammary artery and right leg saphenous vein;  Surgeon: Alleen Borne, MD;  Location: [facility] OR;  Service: Open Heart Surgery;  Laterality: N/A;  . cosmetic breast surgery    . JOINT REPLACEMENT    . KNEE  ARTHROSCOPY Left   . TEE WITHOUT CARDIOVERSION  06/04/2015   Procedure: TRANSESOPHAGEAL ECHOCARDIOGRAM (TEE);  Surgeon: Alleen Borne, MD;  Location: [facility] OR;  Service: Open Heart Surgery;;  . TOTAL KNEE ARTHROPLASTY Left   . TOTAL KNEE ARTHROPLASTY Right 06/06/2013   Procedure: RIGHT TOTAL KNEE ARTHROPLASTY;  Surgeon: Gearlean Alf, MD;  Location: [facility] ORS;  Service: Orthopedics;  Laterality: Right;     Current Meds  Medication Sig  . acetaminophen (TYLENOL ARTHRITIS PAIN) 650 MG CR tablet Take 1,300 mg by mouth 2 (two) times daily.   Marland Kitchen albuterol (VENTOLIN HFA) 108 (90 Base) MCG/ACT inhaler Inhale 2 puffs into the lungs every 4 (four) hours as needed (shortness of breath, if you can't catch your breath).  Marland Kitchen aspirin 81 MG tablet Take 1 tablet (81 mg total) by mouth daily.  Marland Kitchen atorvastatin (LIPITOR) 40 MG tablet TAKE 1 TABLET BY MOUTH  DAILY AT 6 PM. Please make overdue appt with Dr. Meda Coffee before anymore refills. 2nd attempt  . budesonide-formoterol (SYMBICORT) 160-4.5 MCG/ACT inhaler Inhale 2 puffs into the lungs 2 (two) times daily.  Marland Kitchen CALCIUM PO Take 1 tablet by mouth daily.  . Cholecalciferol (VITAMIN D3) 2000 UNITS TABS Take 2,000 Int'l Units by mouth daily.  . citalopram (CELEXA) 40 MG tablet TAKE 1 TABLET BY MOUTH IN  THE MORNING  . clonazePAM (KLONOPIN) 0.5 MG tablet Take 0.5 tablets (0.25 mg total) by mouth at bedtime as needed for anxiety.  . cyclobenzaprine (FLEXERIL) 5 MG tablet Take 1 tablet (5 mg total) by mouth at bedtime as needed for muscle spasms.  . furosemide (LASIX) 20 MG tablet Take 20 mg by mouth 2 (two) times daily.  Marland Kitchen golimumab (SIMPONI ARIA) 50 MG/4ML SOLN injection Inject 50 mg into the vein every 8 (eight) weeks.   Marland Kitchen leflunomide (ARAVA) 20 MG tablet Take 1 tablet (20 mg total) by mouth daily.  Marland Kitchen levothyroxine (SYNTHROID) 100 MCG tablet Take 1 tablet (100 mcg total) by mouth daily.  Marland Kitchen losartan (COZAAR) 50 MG tablet Take 1 tablet (50 mg total) by mouth daily.  .  montelukast (SINGULAIR) 10 MG tablet Take 1 tablet (10 mg total) by mouth at bedtime.  . OXYGEN Inhale 2 L into the lungs continuous. continuous o2   . pantoprazole (PROTONIX) 40 MG tablet TAKE 1 TABLET BY MOUTH  DAILY BEFORE BREAKFAST  . Tiotropium Bromide Monohydrate (SPIRIVA RESPIMAT) 2.5 MCG/ACT AERS 2 puffs each am  . traMADol (ULTRAM) 50 MG tablet Take 50 mg by mouth 2 (two) times daily.  Marland Kitchen triamcinolone cream (KENALOG) 0.1 % Apply 1 application topically 2 (two) times daily as needed (for psoriasis).      Allergies:   Patient has no known allergies.   Social History   Tobacco Use  . Smoking status: Former Smoker    Packs/day: 1.50    Years: 58.50    Pack years: 87.75    Types: Cigarettes    Quit date: 03/03/2005    Years since quitting: 14.6  . Smokeless tobacco: Never Used  Substance Use Topics  . Alcohol use: No  . Drug use: No     Family Hx: The patient's family history includes Breast cancer in her mother; Coronary artery disease in her father; Rheum arthritis in her father.  ROS:   Please see the history of present illness.    All other systems reviewed and are negative.   Prior CV studies:   The following studies were reviewed today:   Labs/Other Tests and Data Reviewed:    EKG: She had EKG performed in the emergency department on October 04, 2019 that showed sinus tachycardia ventricular rate 102 bpm otherwise  normal EKG unchanged from prior.  This was personally reviewed.  Recent Labs: 10/28/2018: ALT 17 11/04/2018: B Natriuretic Peptide 264.9; Magnesium 1.9 03/11/2019: BUN 20; Creatinine, Ser 0.87; Hemoglobin 10.1; Platelets 153; Potassium 4.5; Sodium 142 06/16/2019: TSH 3.32   Recent Lipid Panel Lab Results  Component Value Date/Time   CHOL 133 08/25/2018 08:19 PM   CHOL 148 11/09/2017 03:28 PM   CHOL 117 07/12/2015 03:08 PM   TRIG 108 08/25/2018 08:19 PM   TRIG 75 07/12/2015 03:08 PM   HDL 59 08/25/2018 08:19 PM   HDL 67 11/09/2017 03:28 PM   HDL  51 07/12/2015 03:08 PM   CHOLHDL 2.3 08/25/2018 08:19 PM   LDLCALC 52 08/25/2018 08:19 PM   LDLCALC 58 11/09/2017 03:28 PM   LDLCALC 51 07/12/2015 03:08 PM   LDLDIRECT 131.1 08/10/2012 08:29 AM   Wt Readings from Last 3 Encounters:  10/06/19 202 lb (91.6 kg)  07/15/19 205 lb 9.6 oz (93.3 kg)  06/14/19 208 lb 9.6 oz (94.6 kg)    Objective:    Vital Signs:  BP (!) 158/88   Pulse 93   Ht 4\' 11"  (1.499 m)   Wt 202 lb (91.6 kg)   BMI 40.80 kg/m    VITAL SIGNS:  reviewed    ASSESSMENT & PLAN:    1. CAD, s/p NSTEMI (non-ST elevated myocardial infarction), 3-VD with preserved LV function. Repeat echo showed LVEF 60-65%. On aspirin and atorvastatin, losartan.  She is asymptomatic tolerating all the medications well.  Functional capacity very limited with regards to COPD.  2. Dyspnea - stable- chronic pulmonary interstitial lung disease with fibrosis on home O2, on 2 L of oxygen and recent COPD exacerbation requiring IV steroids.  She continues to take Spiriva and Symbicort.  3. Acute on chronic diastolic dysfunction - on Lasix 20 mg by mouth daily, afternoon one PRN.  Her chest x-ray shows no sign of fluid overload.  We will continue the same regimen.  Her weight is stable.  4. HTN -blood pressure is elevated, this might be secondary to steroid injection.  I will add Cardizem CD 120 mg p.o. nightly to improve blood pressure and persistent tachycardia.  5. Hyperlipidemia - on atorvastatin, tolerated.  6. Hypothyroidism -we will recheck today especially that she has ongoing various.  Follow-up in 3 months.   COVID-19 Education: The signs and symptoms of COVID-19 were discussed with the patient and how to seek care for testing (follow up with PCP or arrange E-visit).  The importance of social distancing was discussed today.  Time:   Today, I have spent 25 minutes with the patient with telehealth technology discussing the above problems.    Medication Adjustments/Labs and Tests  Ordered: Current medicines are reviewed at length with the patient today.  Concerns regarding medicines are outlined above.   Tests Ordered: No orders of the defined types were placed in this encounter.   Medication Changes: No orders of the defined types were placed in this encounter.   Follow Up:  In Person in 3 month(s)  Signed, Tobias Alexander, MD  10/06/2019 11:02 AM    [facility]

## 2019-10-06 NOTE — Patient Instructions (Signed)
Medication Instructions:   START TAKING CARDIZEM CD 120 MG BY MOUTH DAILY AT BEDTIME.  *If you need a refill on your cardiac medications before your next appointment, please call your pharmacy*     Follow-Up: At [facility], you and your health needs are our priority.  As part of our continuing mission to provide you with exceptional heart care, we have created designated Provider Care Teams.  These Care Teams include your primary Cardiologist (physician) and Advanced Practice Providers (APPs -  Physician Assistants and Nurse Practitioners) who all work together to provide you with the care you need, when you need it.  Your next appointment:   3 MONTHS IN PERSON WITH DR. Meda Coffee ON 01/20/20 AT 10:00 AM   The format for your next appointment:   In Person  Provider:   Ena Dawley, MD

## 2019-10-06 NOTE — Addendum Note (Signed)
Addended by: Nuala Alpha on: 10/06/2019 11:22 AM   Modules accepted: Orders

## 2019-10-07 LAB — NOVEL CORONAVIRUS, NAA (HOSP ORDER, SEND-OUT TO REF LAB; TAT 18-24 HRS): SARS-CoV-2, NAA: NOT DETECTED

## 2019-10-08 ENCOUNTER — Other Ambulatory Visit: Payer: Self-pay | Admitting: Cardiology

## 2019-10-08 DIAGNOSIS — E782 Mixed hyperlipidemia: Secondary | ICD-10-CM

## 2019-10-08 DIAGNOSIS — K6289 Other specified diseases of anus and rectum: Secondary | ICD-10-CM

## 2019-10-08 DIAGNOSIS — R198 Other specified symptoms and signs involving the digestive system and abdomen: Secondary | ICD-10-CM

## 2019-10-08 DIAGNOSIS — I10 Essential (primary) hypertension: Secondary | ICD-10-CM

## 2019-10-08 DIAGNOSIS — R197 Diarrhea, unspecified: Secondary | ICD-10-CM

## 2019-10-08 DIAGNOSIS — Z951 Presence of aortocoronary bypass graft: Secondary | ICD-10-CM

## 2019-10-10 ENCOUNTER — Other Ambulatory Visit: Payer: Self-pay | Admitting: Internal Medicine

## 2019-10-10 DIAGNOSIS — M5441 Lumbago with sciatica, right side: Secondary | ICD-10-CM

## 2019-10-10 DIAGNOSIS — Z79899 Other long term (current) drug therapy: Secondary | ICD-10-CM | POA: Diagnosis not present

## 2019-10-10 DIAGNOSIS — M0589 Other rheumatoid arthritis with rheumatoid factor of multiple sites: Secondary | ICD-10-CM | POA: Diagnosis not present

## 2019-10-11 ENCOUNTER — Ambulatory Visit: Payer: No Typology Code available for payment source | Admitting: Adult Health

## 2019-10-11 NOTE — Telephone Encounter (Signed)
Last filled 08/02/2019 Should patient continue this medication?

## 2019-10-14 ENCOUNTER — Ambulatory Visit: Payer: Medicare Other | Admitting: Internal Medicine

## 2019-10-16 ENCOUNTER — Telehealth: Payer: Self-pay | Admitting: Cardiology

## 2019-10-16 NOTE — Telephone Encounter (Signed)
The patient's daughter called the office after hours number with concerns of her mother being lightheaded and weak.  I called and spoke to the patient who says that she felt lightheaded and weak this morning.  She notes that all she has had all day was 2 cups of coffee and a slice of melon.  She feels like taking 11 pills on an empty stomach and not having much intake today has led to her symptoms.  I advised her to drink 2 to 3 cups of water and eat a good meal.  She feels good with that plan.  The patient is not as concerned as her daughter was.

## 2019-10-17 ENCOUNTER — Ambulatory Visit: Payer: Medicare Other | Admitting: Internal Medicine

## 2019-11-08 ENCOUNTER — Ambulatory Visit (INDEPENDENT_AMBULATORY_CARE_PROVIDER_SITE_OTHER): Payer: Medicare Other | Admitting: Internal Medicine

## 2019-11-08 ENCOUNTER — Other Ambulatory Visit: Payer: Self-pay

## 2019-11-08 ENCOUNTER — Encounter: Payer: Self-pay | Admitting: Internal Medicine

## 2019-11-08 DIAGNOSIS — J9612 Chronic respiratory failure with hypercapnia: Secondary | ICD-10-CM | POA: Diagnosis not present

## 2019-11-08 DIAGNOSIS — J449 Chronic obstructive pulmonary disease, unspecified: Secondary | ICD-10-CM

## 2019-11-08 DIAGNOSIS — J9611 Chronic respiratory failure with hypoxia: Secondary | ICD-10-CM | POA: Diagnosis not present

## 2019-11-08 MED ORDER — PREDNISONE 10 MG PO TABS: ORAL_TABLET | ORAL | 11 refills | Status: DC

## 2019-11-08 NOTE — Patient Instructions (Signed)
Plan A = Automatic =Symbicort 160 x 2 pffs each am followed by spriiva 2 puffs and 12 hours later symbicort 160 x 2   Work on inhaler technique:  relax and gently blow all the way out then take a nice smooth deep breath back in, triggering the inhaler at same time you start breathing in.  Hold for up to 5 seconds if you can. Blow out thru nose. Rinse and gargle with water when done   Plan B = Backup Only use your albuterol(proventil) inhaler as a rescue medication to be used if you can't catch your breath by resting or doing a relaxed purse lip breathing pattern.  - The less you use it, the better it will work when you need it. - Ok to use the inhaler up to 2 puffs  every 4 hours if you must but call for appointment if use goes up over your usual need - Don't leave home without it !!  (think of it like the spare tire for your car)   Plan C = Crisis - only use your albuterol nebulizer if you first try Plan B and it fails to help > ok to use the nebulizer up to every 4 hours but if start needing it regularly call for immediate appointment  Plan D = Deltasone  If plan C not working well, add Prednisone 10 mg take  4 each am x 2 days,   2 each am x 2 days,  1 each am x 2 days and stop   Please schedule a follow up visit in 6 months but call sooner if needed

## 2019-11-08 NOTE — Progress Notes (Signed)
Subjective:   Patient ID: [name], female    DOB: [birth year]    MRN: [id number]   Brief patient profile:  73  yowf  quit smoking in May 2006 with dx pna/ ards resumed full activity including yardwork at wt 140 and pft's c/w restrictive changes 04/2010 at wt 170 but reported improvement on saba so maintained chronically on symbicort 160  With pfts 02/08/18  c/w GOLD III copd if use FEV1/VC ratio    History of Present Illness  July 18, 2010  1st pulmonary office eval in ER era c/o doe x 6 months progressive indolent onset with copd vs pf.  Ultimately required hosp 8/30-9/1 dx copd/pf ? cause on sulfasalzine and ACE inhib better on 02 and advair but very hoarse with sense of chest congestion and cough with white mucus day > night.  doe x > slow adls.stop advair Start symbicort 160 2 puffs first thing  in am and 2 puffs again in pm about 12 hours later  Work on inhaler technique:   stop benazapril start benicar 40/25  one daily Use 02 sleeping and any activity other than sitting still   04/10/2014 f/u ov/Yianna Tersigni re: restrictive lung dz/ RA/psoriatric/ symbicort 160 2bid  Chief Complaint  Patient presents with  . Follow-up    Pt states was advised to f/u per [employer].  Pt has been having increased SOB- relates to stress from dealing with her spouse's illness. She is using o2 pretty much 24/7.   trying to do more than baseline activity and finds she needs 02 more. rec Work on inhaler technique:   Ok to just use the symbicort 160 2 puffs in am to see what difference if any this makes in your breathing  Change 02 to 2lpm 24/7 and use 3lpm with activity    06/04/15  cabg      11/10/2017  f/u ov/Orlandus Borowski re: AB / pf related to ALI vs RA / MO  Chief Complaint  Patient presents with  . Follow-up    Breathing has been worse over the past 1-2 months. She states she gets winded just walking from her house to her car. She has been using her albuterol inhaler 4 x daily on average.   on 2lpm at rest and  sleeping needed up to 4lpm continous while doing rehab  but only has pulsed to 3lpm at home per [employer] Much better only while on prednisone / poor hfa noted  Doe = MMRC3 = can't walk 100 yards even at a slow pace at a flat grade s stopping due to sob  Even on 02 but not sure about sats as does not check rec Goal is to keep sats above 90%  - call if you want [employer] to re-evaluate your ambulatory needs  Prednisone 10 mg take  4 each am x 2 days,   2 each am x 2 days,  1 each am x 2 days and stop  Work on inhaler technique:      04/05/18  aecopd > pred x 6 days > back to baseline      10/11/2018  f/u ov/Kenn Rekowski re:  COPD GOLD III/ 02 dep / maint on just symb 160 2bid (no better with incruse) Chief Complaint  Patient presents with  . Follow-up    Breathing is the same. She is using her albuterol inhaler once per wk on average.   Dyspnea:  HT, sometimes [employer] on 3lpm MMRC3 = can't walk 100 yards even at a slow  pace at a flat grade s stopping due to sob   Cough: none Sleeping: on back books under hob plus pillows = 30 degrees  SABA use: rare  02: 2lpm but turns to 3lpm poc with activity    rec Prednisone 10 mg take  4 each am x 2 days,   2 each am x 2 days,  1 each am x 2 days and stop  Work on inhaler technique:   Please schedule a follow up visit in 4 months but call sooner if needed  - add consider adding spiriva next ov if not doing better and insurance will cover   04/18/2019  f/u ov/Walta Bellville re: GOLD III spirometry but 02 dep/ maint on symb 160 2bid/spiriva  Chief Complaint  Patient presents with  . Follow-up    Breathing not improving since the last visit. She states she has not been active at all. She has been using her albuterol daily.   Dyspnea:  No longer shopping due to covid 19 does delivery only  Cough: occ spells/ non productive never noct   Sleeping: on elevated to 30 degrees  SABA use: daily use of nebulizer  02: 2lpm but 3lpm with activity  rec Plan A = Automatic =Symbicort  160 x 2 pffs each am followed by spriiva 2 puffs and 12 hours later symbicort 160 x 2  Work on inhaler technique: Plan B = Backup Only use your albuterol(proventil) inhaler as a rescue medication  Plan C = Crisis - only use your albuterol nebulizer if you first try Plan B and it fails to help > ok to use the nebulizer up to every 4 hours but if start needing it regularly call for immediate appointment    06/14/2019  f/u ov/Raea Magallon re:  GOLD III spirometry but 02 dep 24/7/ maint on symb 160/spiriva [name] Chief Complaint  Patient presents with  . Follow-up    Breathing is overall doing about the same. She is using her albuterol once per wk on average.   Dyspnea:  room to room now on up to 3lpm with sats ok but feels losing ground steadily since prior ov in terms of activity tol/ housebound mostly at this poit   Cough: some spells daytime, not noct, mostly mucoid production Sleeping: 30 degrees elevation helps some SABA use: rarely hfa/ never neb at this point  02: 2lpm bedtime up to 3lpm with activity  rec Prednisone 10 mg Take 4 for three days 3 for three days 2 for three days 1 for three days and stop  No change in medications    07/15/2019  f/u ov/Glendon Fiser re: copd  GOLD III spirometry / 02 dep maint on symb/spiriva/singulair  Chief Complaint  Patient presents with  . COPD III spirometry if use FEV1/VC p saba    Doing better since last visit with the combination of Symbicort and Spiriva   Dyspnea:  Up to 5 min on street x 3lpm = MMRC3 = can't walk 100 yards even at a slow pace at a flat grade s stopping due to sob  But not checking sats at peak ex  Cough: not now  Sleeping: 30 degrees hob / no am flares of cough/ wheeze/sob   SABA use: none 02: 2lpm hs, 2lpm resting, 3lpm vacuuming or walking outside 3lpm POC  rec Work on inhaler technique:    No change in medications or 02     11/08/2019  f/u ov/Montrey Buist re: GOLD III spirom but 02 dep  Chief Complaint  Patient presents with  .  Follow-up    Pt states shes been feeling about the same. Not as active. Coughs a little bit with "yellowish tan" phlegm. Denies any fever or chills.   Dyspnea: MMRC3 = can't walk 100 yards even at a slow pace at a flat grade s stopping due to sob   Cough: min rattling  Sleeping: 30 degrees hob  SABA use: much less  02: 2lpm hs, 2lpm resting, 3lpm vacuuming or walking outside 3lpm POC     No obvious day to day or daytime variability or assoc excess/ purulent sputum or mucus plugs or hemoptysis or cp or chest tightness, subjective wheeze or overt sinus or hb symptoms.   Sleeping above  without nocturnal  or early am exacerbation  of respiratory  c/o's or need for noct saba. Also denies any obvious fluctuation of symptoms with weather or environmental changes or other aggravating or alleviating factors except as outlined above   No unusual exposure hx or h/o childhood pna/ asthma or knowledge of premature birth.  Current Allergies, Complete Past Medical History, Past Surgical History, Family History, and Social History were reviewed in [employer] record.  ROS  The following are not active complaints unless bolded Hoarseness, sore throat, dysphagia, dental problems, itching, sneezing,  nasal congestion or discharge of excess mucus or purulent secretions, ear ache,   fever, chills, sweats, unintended wt loss or wt gain, classically pleuritic or exertional cp,  orthopnea pnd or arm/hand swelling  or leg swelling, presyncope, palpitations, abdominal pain, anorexia, nausea, vomiting, diarrhea  or change in bowel habits or change in bladder habits, change in stools or change in urine, dysuria, hematuria,  rash, arthralgias, visual complaints, headache, numbness, weakness or ataxia or problems with walking or coordination,  change in mood or  memory.        Current Meds  Medication Sig  . acetaminophen (TYLENOL ARTHRITIS PAIN) 650 MG CR tablet Take 1,300 mg by mouth 2 (two)  times daily.   Marland Kitchen albuterol (VENTOLIN HFA) 108 (90 Base) MCG/ACT inhaler Inhale 2 puffs into the lungs every 4 (four) hours as needed (shortness of breath, if you can't catch your breath).  Marland Kitchen aspirin 81 MG tablet Take 1 tablet (81 mg total) by mouth daily.  Marland Kitchen atorvastatin (LIPITOR) 40 MG tablet TAKE 1 TABLET BY MOUTH  DAILY AT 6 PM. Please make overdue appt with Dr. Delton See before anymore refills. 2nd attempt  . budesonide-formoterol (SYMBICORT) 160-4.5 MCG/ACT inhaler Inhale 2 puffs into the lungs 2 (two) times daily.  Marland Kitchen CALCIUM PO Take 1 tablet by mouth daily.  . Cholecalciferol (VITAMIN D3) 2000 UNITS TABS Take 2,000 Int'l Units by mouth daily.  . citalopram (CELEXA) 40 MG tablet TAKE 1 TABLET BY MOUTH IN  THE MORNING  . clonazePAM (KLONOPIN) 0.5 MG tablet Take 0.5 tablets (0.25 mg total) by mouth at bedtime as needed for anxiety.  . cyclobenzaprine (FLEXERIL) 5 MG tablet TAKE 1 TABLET BY MOUTH AT BEDTIME AS NEEDED FOR MUSCLE SPASMS.  Marland Kitchen diltiazem (CARDIZEM CD) 120 MG 24 hr capsule Take 1 capsule (120 mg total) by mouth at bedtime.  . furosemide (LASIX) 20 MG tablet Take 20 mg by mouth 2 (two) times daily.  Marland Kitchen golimumab (SIMPONI ARIA) 50 MG/4ML SOLN injection Inject 50 mg into the vein every 8 (eight) weeks.   Marland Kitchen leflunomide (ARAVA) 20 MG tablet Take 1 tablet (20 mg total) by mouth daily.  Marland Kitchen levothyroxine (SYNTHROID) 100 MCG tablet Take 1 tablet (  100 mcg total) by mouth daily.  Marland Kitchen losartan (COZAAR) 50 MG tablet TAKE 1 TABLET BY MOUTH  DAILY  . montelukast (SINGULAIR) 10 MG tablet Take 1 tablet (10 mg total) by mouth at bedtime.  . OXYGEN Inhale 2 L into the lungs continuous. continuous o2   . pantoprazole (PROTONIX) 40 MG tablet TAKE 1 TABLET BY MOUTH  DAILY BEFORE BREAKFAST  . Tiotropium Bromide Monohydrate (SPIRIVA RESPIMAT) 2.5 MCG/ACT AERS 2 puffs each am  . traMADol (ULTRAM) 50 MG tablet Take 50 mg by mouth 2 (two) times daily.  Marland Kitchen triamcinolone cream (KENALOG) 0.1 % Apply 1 application  topically 2 (two) times daily as needed (for psoriasis).                     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       - CT chest 07/03/10 Nonspecific PF mostly upper lobes       - CT chest 12/03/10 acute gg changes and effusions c/w chf        - PFT's  04/12/10 FEV1  1.21 (69%) ratio 77 and no change p B2,  DLC0 56% Asthmatic bronchitis     - HFA 50% p coaching July 18, 2010 >>  90%  02/25/2011  history of mild vitamin D deficiency        Objective:   Physical Exam   Obese pleasant amb wf nad in w/c    Vital signs reviewed - Note on arrival 02 sats  96% on   2lpm pulsing   11/08/2019     204   wt 192 July 18, 2010 > 197 August 21, 2010 > 207 December 05, 2010  > 183  01/21/2011  > 179 02/25/2011 >189 08/10/2012 >195 09/08/12 >200 12/27/12 > 202 05/03/2013 > 06/28/2013  199 > 08/08/13 194 > 04/10/2014  203 > 04/19/2015   176 >  07/23/2015 166 > 09/16/2016  210 >  10/29/2016  209 > 04/29/2017    206 > 11/10/2017   209 > 02/08/2018  207 > 03/24/2018 209> 04/23/2018  208> 10/11/2018   208  > 04/18/2019  203 > 06/14/2019   208 > 07/15/2019   205      HEENT : pt wearing mask not removed for exam due to covid - 19 concerns.    NECK :  without JVD/Nodes/TM/ nl carotid upstrokes bilaterally   LUNGS: no acc muscle use,  Mild barrel  contour chest wall with bilateral  Distant bs s audible wheeze and  without cough on insp or exp maneuvers  and mild  Hyperresonant  to  percussion bilaterally     CV:  RRR  no s3 or murmur or increase in P2, and no edema   ABD:  Obese/ soft and nontender with pos end  insp Hoover's  in the supine  position. No bruits or organomegaly appreciated, bowel sounds nl  MS:   Nl gait/  ext warm without deformities, calf tenderness, cyanosis or clubbing No obvious joint restrictions   SKIN: warm and dry without lesions    NEURO:  alert, approp, nl sensorium with  no motor or cerebellar deficits apparent.

## 2019-11-09 ENCOUNTER — Encounter: Payer: Self-pay | Admitting: Internal Medicine

## 2019-11-09 NOTE — Assessment & Plan Note (Addendum)
02 dependent  since 07/02/10 >>  83% RA December 05, 2010       - ONO RA 08/05/12  :  Positive sat < 89 x 2:62m> repeat on 2lpm rec 08/12/2012  - 06/17/2013 reported desat with activity p Knee surgery > rec restart 2lpm with activity  - 06/27/2013   Walked 2lpm  x one lap @ 185 stopped due to sat 88% not sob , desat to 82% on RA just at the  Start of the walk  - 04/10/14  Sats  81% RA and then  Walked 3lpm x  2 laps @ 185 ft each stopped due to end of study, no desat  - HCO3  11/09/17     = 31 - HCO3  08/27/18 = 37   - HCO3  03/11/2019 = 33    Adequate control on present rx, reviewed in detail with pt > no change in rx needed  = 02: 2lpm hs, 2lpm resting, 3lpm vacuuming or walking outside 3lpm POC

## 2019-11-09 NOTE — Assessment & Plan Note (Addendum)
Quit smoking May 2006       - PFT's  04/12/10 FEV1  1.21 (69%) ratio 77 and no change p B2,  DLC0 56%   VC 70%         - PFTs  08/08/2013 FEV1 1.21 (60%) ratio 86 and no change p B2 DLCO 79%  VC 72%  On symbicort 160 2bid  - PFT's  02/08/2018  FEV1 0.70 (40 % ) ratio 56 if use FEV1/VC  p 38 % improvement from saba p symb 160 prior to study with DLCO  78 % corrects to 147  % for alv volume   - 02/08/2018  After extensive coaching inhaler device  effectiveness =    75% with mdi and 90% with dpi so change to Trelegy > no change from sample so rec resume symb 160 2 bid as of 03/24/2018  - 04/18/2019    changed spiriva to smi  11/08/2019  After extensive coaching inhaler device,  effectiveness =    75% (short Ti) > continue symb / spiriva    Group D in terms of symptom/risk and laba/lama/ICS  therefore appropriate rx at this point >>>  Continue symb/spiriva plus add prednisone as plan D to get thru the winter s ov or ER exp to covid 19  Pt informed of the seriousness of COVID 19 infection as a direct risk to lung health  and safey and to close contacts and should continue to wear a facemask in public and minimize exposure to public locations but especially avoid any area or activity where non-close contacts are not observing distancing or wearing an appropriate face mask.  I strongly recommended vaccine when offered.     Each maintenance medication was reviewed in detail including emphasizing most importantly the difference between maintenance and prns and under what circumstances the prns are to be triggered using an action plan format that is not reflected in the computer generated alphabetically organized AVS which I have not found useful in most complex patients, especially with respiratory illnesses  Total time for H and P, chart review, counseling, teaching device and generating AVS / charting =  30 min

## 2019-11-21 ENCOUNTER — Other Ambulatory Visit: Payer: Self-pay | Admitting: *Deleted

## 2019-11-21 NOTE — Patient Outreach (Signed)
[facility]) Care Management  11/21/2019  [name] November 08, 1945 [id number]  Unsuccessful first attempt for screening call. RN Health Coach left HIPAA compliant voice message.   Plan: RN Health Coach will send unsuccessful letter RN Health Coach will attempt to outreach within the next 2-3 business days.   [name] RN, BSN [facility] Care Management  RN Health Coach [phone] [email or web address]

## 2019-11-22 ENCOUNTER — Other Ambulatory Visit: Payer: Self-pay | Admitting: *Deleted

## 2019-11-22 NOTE — Patient Outreach (Signed)
[facility]) Care Management  11/22/2019  [name] 08-28-1946 [id number]  Outreach call unsuccessful. Left HIPAA compliance voice message.   Plan: RN Health Coach will call back within 10 days.   [name] RN, BSN [facility] Care Management  RN Health Coach [phone] [name]

## 2019-11-23 ENCOUNTER — Other Ambulatory Visit: Payer: Self-pay | Admitting: Cardiology

## 2019-11-23 NOTE — Telephone Encounter (Signed)
Pt's pharmacy is requesting a refill on symbicort inhaler. Would Dr. Delton See like to refill this medication? Please address

## 2019-12-01 ENCOUNTER — Other Ambulatory Visit: Payer: Self-pay | Admitting: *Deleted

## 2019-12-01 NOTE — Patient Outreach (Signed)
[facility]) Care Management  12/01/2019  [name] 1946-04-17 [id number]   Unsuccessful outreach attempt made to patient made for screening, third attempt.  RN Health Coach left HIPAA compliant voicemail message along with her contact information.  Plan: RN Health Coach will close case within 10 business days of unsuccessful letter being sent out to patient if no return call back.  [name] RN, BSN [facility] Care Management  RN Health Coach [phone] [name]

## 2019-12-02 ENCOUNTER — Other Ambulatory Visit: Payer: Self-pay | Admitting: *Deleted

## 2019-12-02 NOTE — Patient Outreach (Signed)
[facility]) Care Management  12/02/2019  Cristina Ceniceros 1946-03-25 [id number]   RN Health Coach Case Closure   Outreach Attempt:  Multiple attempts to establish contact with patient without success. No response from letter mailed to patient. Case is being closed at this time.   Plan:  RN Health Coach will close case due to inability to establish contact.  Rhae Lerner RN [facility] Care Management  RN Health Coach [phone] [email or web address]

## 2019-12-05 ENCOUNTER — Encounter: Payer: Self-pay | Admitting: Internal Medicine

## 2019-12-08 ENCOUNTER — Other Ambulatory Visit: Payer: Self-pay | Admitting: Internal Medicine

## 2019-12-08 DIAGNOSIS — E039 Hypothyroidism, unspecified: Secondary | ICD-10-CM

## 2019-12-12 ENCOUNTER — Other Ambulatory Visit: Payer: Self-pay | Admitting: Cardiology

## 2019-12-12 DIAGNOSIS — E032 Hypothyroidism due to medicaments and other exogenous substances: Secondary | ICD-10-CM

## 2019-12-12 DIAGNOSIS — M0589 Other rheumatoid arthritis with rheumatoid factor of multiple sites: Secondary | ICD-10-CM | POA: Diagnosis not present

## 2019-12-12 DIAGNOSIS — R7989 Other specified abnormal findings of blood chemistry: Secondary | ICD-10-CM

## 2019-12-12 DIAGNOSIS — Z79899 Other long term (current) drug therapy: Secondary | ICD-10-CM | POA: Diagnosis not present

## 2019-12-14 ENCOUNTER — Other Ambulatory Visit: Payer: Self-pay

## 2019-12-14 ENCOUNTER — Other Ambulatory Visit: Payer: Medicare Other

## 2019-12-14 ENCOUNTER — Telehealth (INDEPENDENT_AMBULATORY_CARE_PROVIDER_SITE_OTHER): Payer: Medicare Other | Admitting: Internal Medicine

## 2019-12-14 ENCOUNTER — Other Ambulatory Visit: Payer: Self-pay | Admitting: Internal Medicine

## 2019-12-14 ENCOUNTER — Ambulatory Visit (INDEPENDENT_AMBULATORY_CARE_PROVIDER_SITE_OTHER): Payer: Medicare Other

## 2019-12-14 DIAGNOSIS — M25551 Pain in right hip: Secondary | ICD-10-CM | POA: Diagnosis not present

## 2019-12-14 DIAGNOSIS — E039 Hypothyroidism, unspecified: Secondary | ICD-10-CM

## 2019-12-14 DIAGNOSIS — S79911A Unspecified injury of right hip, initial encounter: Secondary | ICD-10-CM | POA: Diagnosis not present

## 2019-12-14 DIAGNOSIS — R102 Pelvic and perineal pain: Secondary | ICD-10-CM | POA: Diagnosis not present

## 2019-12-14 MED ORDER — LEVOTHYROXINE SODIUM 100 MCG PO TABS: 100.0000 ug | ORAL_TABLET | Freq: Every day | ORAL | 1 refills | Status: DC

## 2019-12-14 NOTE — Progress Notes (Signed)
Virtual Visit via Telephone Note  I connected with [name] on 12/14/19 at  2:30 PM EST by telephone and verified that I am speaking with the correct person using two identifiers.   I discussed the limitations, risks, security and privacy concerns of performing an evaluation and management service by telephone and the availability of in person appointments. I also discussed with the patient that there may be a patient responsible charge related to this service. The patient expressed understanding and agreed to proceed.  Location patient: home Location provider: work office Participants present for the call: patient, provider Patient did not have a visit in the prior 7 days to address this/these issue(s).   History of Present Illness:  She has scheduled this visit to discuss some pain in her tailbone and right hand on January 1 she suffered a mechanical fall from standing where she was standing on her tiptoes to look out her front window and lost her balance falling straight back on her buttocks, she did not lose consciousness.  Ever since then she has been having tailbone and right hip pain.  She tried to come see me last week but I was out of the office and she decided that she could hold off until today.  She has been taking Tylenol and tramadol twice daily alternating with Aleve 2 tablets daily.  She is still very sore, it is painful to sit or stand for long periods of time.   Observations/Objective: Patient sounds cheerful and well on the phone. I do not appreciate any increased work of breathing. Speech and thought processing are grossly intact. Patient reported vitals: None reported   Current Outpatient Medications:  .  acetaminophen (TYLENOL ARTHRITIS PAIN) 650 MG CR tablet, Take 1,300 mg by mouth 2 (two) times daily. , Disp: , Rfl:  .  albuterol (VENTOLIN HFA) 108 (90 Base) MCG/ACT inhaler, Inhale 2 puffs into the lungs every 4 (four) hours as needed (shortness of  breath, if you can't catch your breath)., Disp: , Rfl:  .  aspirin 81 MG tablet, Take 1 tablet (81 mg total) by mouth daily., Disp: 30 tablet, Rfl: 0 .  atorvastatin (LIPITOR) 40 MG tablet, TAKE 1 TABLET BY MOUTH  DAILY AT 6 PM., Disp: 90 tablet, Rfl: 2 .  CALCIUM PO, Take 1 tablet by mouth daily., Disp: , Rfl:  .  Cholecalciferol (VITAMIN D3) 2000 UNITS TABS, Take 2,000 Int'l Units by mouth daily., Disp: , Rfl:  .  citalopram (CELEXA) 40 MG tablet, TAKE 1 TABLET BY MOUTH IN  THE MORNING, Disp: 90 tablet, Rfl: 3 .  clonazePAM (KLONOPIN) 0.5 MG tablet, Take 0.5 tablets (0.25 mg total) by mouth at bedtime as needed for anxiety., Disp: 20 tablet, Rfl: 1 .  cyclobenzaprine (FLEXERIL) 5 MG tablet, TAKE 1 TABLET BY MOUTH AT BEDTIME AS NEEDED FOR MUSCLE SPASMS., Disp: 20 tablet, Rfl: 0 .  diltiazem (CARDIZEM CD) 120 MG 24 hr capsule, Take 1 capsule (120 mg total) by mouth at bedtime., Disp: 90 capsule, Rfl: 1 .  furosemide (LASIX) 20 MG tablet, Take 20 mg by mouth 2 (two) times daily., Disp: , Rfl:  .  golimumab (SIMPONI ARIA) 50 MG/4ML SOLN injection, Inject 50 mg into the vein every 8 (eight) weeks. , Disp: , Rfl:  .  leflunomide (ARAVA) 20 MG tablet, Take 1 tablet (20 mg total) by mouth daily., Disp: , Rfl:  .  levothyroxine (SYNTHROID) 100 MCG tablet, Take 1 tablet (100 mcg total) by mouth  daily., Disp: 90 tablet, Rfl: 1 .  losartan (COZAAR) 50 MG tablet, TAKE 1 TABLET BY MOUTH  DAILY, Disp: 90 tablet, Rfl: 3 .  montelukast (SINGULAIR) 10 MG tablet, Take 1 tablet (10 mg total) by mouth at bedtime., Disp: 90 tablet, Rfl: 3 .  OXYGEN, Inhale 2 L into the lungs continuous. continuous o2 , Disp: , Rfl:  .  pantoprazole (PROTONIX) 40 MG tablet, TAKE 1 TABLET BY MOUTH  DAILY BEFORE BREAKFAST, Disp: 90 tablet, Rfl: 3 .  predniSONE (DELTASONE) 10 MG tablet, Take  4 each am x 2 days,   2 each am x 2 days,  1 each am x 2 days and stop, Disp: 14 tablet, Rfl: 11 .  SYMBICORT 160-4.5 MCG/ACT inhaler, USE 2  INHALATIONS BY MOUTH  TWICE DAILY, Disp: 3 Inhaler, Rfl: 6 .  Tiotropium Bromide Monohydrate (SPIRIVA RESPIMAT) 2.5 MCG/ACT AERS, 2 puffs each am, Disp: 12 g, Rfl: 3 .  traMADol (ULTRAM) 50 MG tablet, Take 50 mg by mouth 2 (two) times daily., Disp: , Rfl:  .  triamcinolone cream (KENALOG) 0.1 %, Apply 1 application topically 2 (two) times daily as needed (for psoriasis). , Disp: , Rfl:   Review of Systems:  Constitutional: Denies fever, chills, diaphoresis, appetite change and fatigue.  HEENT: Denies photophobia, eye pain, redness, hearing loss, ear pain, congestion, sore throat, rhinorrhea, sneezing, mouth sores, trouble swallowing, neck pain, neck stiffness and tinnitus.   Respiratory: Denies SOB, DOE, cough, chest tightness,  and wheezing.   Cardiovascular: Denies chest pain, palpitations and leg swelling.  Gastrointestinal: Denies nausea, vomiting, abdominal pain, diarrhea, constipation, blood in stool and abdominal distention.  Genitourinary: Denies dysuria, urgency, frequency, hematuria, flank pain and difficulty urinating.  Endocrine: Denies: hot or cold intolerance, sweats, changes in hair or nails, polyuria, polydipsia. Musculoskeletal: Denies myalgias,  joint swelling. Skin: Denies pallor, rash and wound.  Neurological: Denies dizziness, seizures, syncope, weakness, light-headedness, numbness and headaches.  Hematological: Denies adenopathy. Easy bruising, personal or family bleeding history  Psychiatric/Behavioral: Denies suicidal ideation, mood changes, confusion, nervousness, sleep disturbance and agitation   Assessment and Plan:  Right hip pain  -likely muscle soreness, bruising, however feel it is important to rule out hip or pelvic fractures. -She will come in this afternoon for x-rays. -In the meantime we have discussed icing, physical therapy, continue with pain relievers as she is.  Acquired hypothyroidism  - Plan: levothyroxine (SYNTHROID) 100 MCG tablet    I  discussed the assessment and treatment plan with the patient. The patient was provided an opportunity to ask questions and all were answered. The patient agreed with the plan and demonstrated an understanding of the instructions.   The patient was advised to call back or seek an in-person evaluation if the symptoms worsen or if the condition fails to improve as anticipated.  I provided 16 minutes of non-face-to-face time during this encounter.   Chaya Jan, MD [facility]

## 2019-12-15 ENCOUNTER — Other Ambulatory Visit: Payer: Self-pay | Admitting: Internal Medicine

## 2019-12-15 DIAGNOSIS — M25551 Pain in right hip: Secondary | ICD-10-CM

## 2019-12-15 DIAGNOSIS — M5441 Lumbago with sciatica, right side: Secondary | ICD-10-CM

## 2019-12-15 DIAGNOSIS — Z9181 History of falling: Secondary | ICD-10-CM

## 2019-12-29 ENCOUNTER — Telehealth: Payer: Self-pay | Admitting: Internal Medicine

## 2019-12-29 NOTE — Telephone Encounter (Signed)
Gregary Signs saw pt yesterday for PT for R Hip and leg. He needs verbal orders to see the pt 1w1, 2w6, 1w2  Medication interaction: Gregary Signs noticed that the Cyclobenzaprine interacts with Tramadol   Gregary Signs can be reached at [phone] per Gregary Signs to leave a detailed message

## 2019-12-30 NOTE — Telephone Encounter (Signed)
Verbal orders given to [facility]. Patient is aware of direction given for her medications.

## 2020-01-03 ENCOUNTER — Telehealth (INDEPENDENT_AMBULATORY_CARE_PROVIDER_SITE_OTHER): Payer: Medicare Other | Admitting: Family

## 2020-01-03 DIAGNOSIS — E039 Hypothyroidism, unspecified: Secondary | ICD-10-CM

## 2020-01-03 DIAGNOSIS — I10 Essential (primary) hypertension: Secondary | ICD-10-CM | POA: Diagnosis not present

## 2020-01-03 DIAGNOSIS — E782 Mixed hyperlipidemia: Secondary | ICD-10-CM

## 2020-01-03 MED ORDER — TRAMADOL HCL 50 MG PO TABS: 50.0000 mg | ORAL_TABLET | Freq: Two times a day (BID) | ORAL | 1 refills | Status: DC

## 2020-01-03 NOTE — Progress Notes (Addendum)
Established Patient Office Visit  Subjective:  Patient ID: [name], female    DOB: [birth year]  Age: 74 y.o. MRN: [id number]  CC: No chief complaint on file.  Location: Patient home/Provider clinic [facility]  I connected with  [name] on 01/03/20 by a video enabled telemedicine application and verified that I am speaking with the correct person using two identifiers.    I discussed the limitations of evaluation and management by telemedicine. The patient expressed understanding and agreed to proceed.   HPI [name] presents for a recheck of hypothyroidism, hypertension. She is doing well. Reports recently having a fall and having xrays that were negative. She is doing well. No concerns.   Past Medical History:  Diagnosis Date  . Acute bronchitis 09/25/2015  . ACUTE ON CHRONIC DIASTOLIC HEART FAILURE 12/04/2010   Qualifier: Diagnosis of  By: Ladona Ridgel, MD, [facility], [place]   . Arthritis    OA RIGHT KNEE WITH PAIN  . Barrett esophagus   . Bradycardia 06/01/2015  . CHF (congestive heart failure) (HCC)   . Chronic respiratory failure (HCC)   . Chronic respiratory failure with hypoxia and hypercapnia (HCC) 02/04/2010   Followed in Pulmonary clinic/ [facility]/ [facility]       - 02 dependent  since 07/02/10 >>  83% RA December 05, 2010       - ONO RA 08/05/12  :  Positive sat < 89 x 2:51m> repeat on 2lpm rec 08/12/2012  - 06/17/2013 reported desat with activity p Knee surgery > rec restart 2lpm with activity  - 06/27/2013   Walked 2lpm  x one lap @ 185 stopped due to sat 88% not sob , desat to 82% on RA just at th  . COPD (chronic obstructive pulmonary disease) (HCC)   . COPD III spirometry if use FEV1/VC p saba  07/18/2010   Quit smoking May 2006       - PFT's  04/12/10 FEV1  1.21 (69%) ratio 77 and no change p B2,  DLC0 56%   VC 70%         - PFTs  08/08/2013 FEV1 1.21 (60%) ratio 86 and no change p B2 DLCO 79%  VC 72%  On symbicort 160 2bid  - PFT's  02/08/2018  FEV1  0.70 (40 % ) ratio 56 if use FEV1/VC  p 38 % improvement from saba p symb 160 prior to study with DLCO  78 % corrects to 147  % for alv volume   - 02/08/2018  . Cough variant asthma 02/26/2011   Followed in Pulmonary clinic/ [facility]/ [facility]  - PFT's  06/04/15  FEV1 1.20 (67 % ) ratio 83  p 6 % improvement from saba with DLCO  80 % corrects to 132 % for alv volume      - Clinical dx based on response to symbicort       FENO 09/16/2016  =   96 on symbicort 160 2bid > added singulair  Allergy profile 09/16/2016 >  Eos 0.5 /  IgE  78 neg RAST  -  Referred to rehab 04/29/2017 > completed  . DOE (dyspnea on exertion) 02/19/2016  . Essential hypertension 04/20/2007   Qualifier: Diagnosis of  By: Marcelyn Ditty RN, Katy Fitch   . GERD (gastroesophageal reflux disease)   . History of ARDS 2006  . History of home oxygen therapy    AT NIGHT WHEN SLEEPING 2 L / MIN NASAL CANNULA  . Hyperlipidemia 07/12/2015  .  Hypertension   . Hypothyroidism   . Morbid (severe) obesity due to excess calories (HCC) 04/22/2015   pfts with erv 14% 06/04/15  And 33% 02/08/2018   . NSTEMI (non-ST elevated myocardial infarction) (HCC) 05/31/2015  . Pneumococcal pneumonia (HCC) 2006   HOSPITALIZED AND DEVELOPED ARDS  . Psoriatic arthritis (HCC)   . Pulmonary fibrosis (HCC)   . PULMONARY FIBROSIS ILD POST INFLAMMATORY CHRONIC 07/18/2010   Followed as Primary Care Patient/ [facility]/ [facility]  -s/p ARDS 2006 with bacteremic S  Pna       - CT chest 07/03/10 Nonspecific PF mostly upper lobes       - CT chest 12/03/10 acute gg changes and effusions c/w chf - PFT's  02/08/2018  FVC 0.64 (28 %)   with DLCO  78 % corrects to 147 % for alv volume     . Rheumatic disease   . 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 SOB (shortness of breath) on exertion   . Stroke [facility])     Past Surgical History:  Procedure Laterality Date  . ABDOMINOPLASTY    . CARDIAC CATHETERIZATION N/A 06/01/2015   Procedure: Left Heart Cath and Coronary Angiography;  Surgeon: Runell Gess, MD;  Location: [facility] INVASIVE CV LAB;  Service: Cardiovascular;  Laterality: N/A;  . CARPAL TUNNEL RELEASE    . CHOLECYSTECTOMY    . CORONARY ARTERY BYPASS GRAFT N/A 06/04/2015   Procedure: CORONARY ARTERY BYPASS GRAFT times three            with left internal mammary artery and right leg saphenous vein;  Surgeon: Alleen Borne, MD;  Location: [facility] OR;  Service: Open Heart Surgery;  Laterality: N/A;  . cosmetic breast surgery    . JOINT REPLACEMENT    . KNEE ARTHROSCOPY Left   . TEE WITHOUT CARDIOVERSION  06/04/2015   Procedure: TRANSESOPHAGEAL ECHOCARDIOGRAM (TEE);  Surgeon: Alleen Borne, MD;  Location: [facility] OR;  Service: Open Heart Surgery;;  . TOTAL KNEE ARTHROPLASTY Left   . TOTAL KNEE ARTHROPLASTY Right 06/06/2013   Procedure: RIGHT TOTAL KNEE ARTHROPLASTY;  Surgeon: Loanne Drilling, MD;  Location: [facility] ORS;  Service: Orthopedics;  Laterality: Right;    Family History  Problem Relation Age of Onset  . Breast cancer Mother   . Coronary artery disease Father   . Rheum arthritis Father     Social History   Socioeconomic History  . Marital status: Widowed    Spouse name: Not on file  . Number of children: Not on file  . Years of education: Not on file  . Highest education level: Not on file  Occupational History  . Occupation: Nurse, children's: [employer]  Tobacco Use  . Smoking status: Former Smoker    Packs/day: 1.50    Years: 58.50    Pack years: 87.75    Types: Cigarettes    Quit date: 03/03/2005    Years since quitting: 14.8  . Smokeless tobacco: Never Used  Substance and Sexual Activity  . Alcohol use: No  . Drug use: No  . Sexual activity: Not on file  Other Topics Concern  . Not on file  Social History Narrative  . Not on file   Social Determinants of Health   Financial Resource Strain:   . Difficulty of Paying Living Expenses: Not on file  Food Insecurity:   . Worried About Programme researcher, broadcasting/film/video in the Last Year: Not on file  . Ran Out  of Food  in the Last Year: Not on file  Transportation Needs:   . Lack of Transportation (Medical): Not on file  . Lack of Transportation (Non-Medical): Not on file  Physical Activity:   . Days of Exercise per Week: Not on file  . Minutes of Exercise per Session: Not on file  Stress:   . Feeling of Stress : Not on file  Social Connections:   . Frequency of Communication with Friends and Family: Not on file  . Frequency of Social Gatherings with Friends and Family: Not on file  . Attends Religious Services: Not on file  . Active Member of Clubs or Organizations: Not on file  . Attends Banker Meetings: Not on file  . Marital Status: Not on file  Intimate Partner Violence:   . Fear of Current or Ex-Partner: Not on file  . Emotionally Abused: Not on file  . Physically Abused: Not on file  . Sexually Abused: Not on file    Outpatient Medications Prior to Visit  Medication Sig Dispense Refill  . acetaminophen (TYLENOL ARTHRITIS PAIN) 650 MG CR tablet Take 1,300 mg by mouth 2 (two) times daily.     Marland Kitchen albuterol (VENTOLIN HFA) 108 (90 Base) MCG/ACT inhaler Inhale 2 puffs into the lungs every 4 (four) hours as needed (shortness of breath, if you can't catch your breath).    Marland Kitchen aspirin 81 MG tablet Take 1 tablet (81 mg total) by mouth daily. 30 tablet 0  . atorvastatin (LIPITOR) 40 MG tablet TAKE 1 TABLET BY MOUTH  DAILY AT 6 PM. 90 tablet 2  . CALCIUM PO Take 1 tablet by mouth daily.    . Cholecalciferol (VITAMIN D3) 2000 UNITS TABS Take 2,000 Int'l Units by mouth daily.    . citalopram (CELEXA) 40 MG tablet TAKE 1 TABLET BY MOUTH IN  THE MORNING 90 tablet 3  . clonazePAM (KLONOPIN) 0.5 MG tablet Take 0.5 tablets (0.25 mg total) by mouth at bedtime as needed for anxiety. 20 tablet 1  . cyclobenzaprine (FLEXERIL) 5 MG tablet TAKE 1 TABLET BY MOUTH AT BEDTIME AS NEEDED FOR MUSCLE SPASMS. 20 tablet 0  . diltiazem (CARDIZEM CD) 120 MG 24 hr capsule Take 1 capsule (120 mg total) by mouth at  bedtime. 90 capsule 1  . furosemide (LASIX) 20 MG tablet Take 20 mg by mouth 2 (two) times daily.    Marland Kitchen golimumab (SIMPONI ARIA) 50 MG/4ML SOLN injection Inject 50 mg into the vein every 8 (eight) weeks.     Marland Kitchen leflunomide (ARAVA) 20 MG tablet Take 1 tablet (20 mg total) by mouth daily.    Marland Kitchen levothyroxine (SYNTHROID) 100 MCG tablet Take 1 tablet (100 mcg total) by mouth daily. 90 tablet 1  . losartan (COZAAR) 50 MG tablet TAKE 1 TABLET BY MOUTH  DAILY 90 tablet 3  . montelukast (SINGULAIR) 10 MG tablet Take 1 tablet (10 mg total) by mouth at bedtime. 90 tablet 3  . OXYGEN Inhale 2 L into the lungs continuous. continuous o2     . pantoprazole (PROTONIX) 40 MG tablet TAKE 1 TABLET BY MOUTH  DAILY BEFORE BREAKFAST 90 tablet 3  . SYMBICORT 160-4.5 MCG/ACT inhaler USE 2 INHALATIONS BY MOUTH  TWICE DAILY 3 Inhaler 6  . Tiotropium Bromide Monohydrate (SPIRIVA RESPIMAT) 2.5 MCG/ACT AERS 2 puffs each am 12 g 3  . triamcinolone cream (KENALOG) 0.1 % Apply 1 application topically 2 (two) times daily as needed (for psoriasis).     . traMADol (  ULTRAM) 50 MG tablet Take 50 mg by mouth 2 (two) times daily.    . predniSONE (DELTASONE) 10 MG tablet Take  4 each am x 2 days,   2 each am x 2 days,  1 each am x 2 days and stop 14 tablet 11   No facility-administered medications prior to visit.    No Known Allergies  ROS Review of Systems    Objective:    Physical Exam  There were no vitals taken for this visit. Wt Readings from Last 3 Encounters:  11/08/19 204 lb 3.2 oz (92.6 kg)  10/06/19 202 lb (91.6 kg)  07/15/19 205 lb 9.6 oz (93.3 kg)     Health Maintenance Due  Topic Date Due  . Hepatitis C Screening  10-21-1946  . COLONOSCOPY  03/05/1997    There are no preventive care reminders to display for this patient.  Lab Results  Component Value Date   TSH 3.32 06/16/2019   Lab Results  Component Value Date   WBC 7.2 03/11/2019   HGB 10.1 (L) 03/11/2019   HCT 33.3 (L) 03/11/2019   MCV  90.7 03/11/2019   PLT 153 03/11/2019   Lab Results  Component Value Date   NA 142 03/11/2019   K 4.5 03/11/2019   CO2 33 (H) 03/11/2019   GLUCOSE 135 (H) 03/11/2019   BUN 20 03/11/2019   CREATININE 0.87 03/11/2019   BILITOT 0.7 10/28/2018   ALKPHOS 44 10/28/2018   AST 20 10/28/2018   ALT 17 10/28/2018   PROT 6.6 10/28/2018   ALBUMIN 3.0 (L) 10/28/2018   CALCIUM 9.1 03/11/2019   ANIONGAP 7 03/11/2019   GFR 45.51 (L) 11/24/2018   Lab Results  Component Value Date   CHOL 133 08/25/2018   Lab Results  Component Value Date   HDL 59 08/25/2018   Lab Results  Component Value Date   LDLCALC 52 08/25/2018   Lab Results  Component Value Date   TRIG 108 08/25/2018   Lab Results  Component Value Date   CHOLHDL 2.3 08/25/2018   Lab Results  Component Value Date   HGBA1C 5.3 08/25/2018      Assessment & Plan:   Problem List Items Addressed This Visit    Hypothyroidism   Hyperlipidemia - Primary   Essential hypertension      Meds ordered this encounter  Medications  . traMADol (ULTRAM) 50 MG tablet    Sig: Take 1 tablet (50 mg total) by mouth 2 (two) times daily.    Dispense:  60 tablet    Refill:  1    Follow-up: Return in about 3 months (around 04/04/2020). Patient declined labs. Will get them through Cardiology   Kennyth Arnold, FNP

## 2020-01-16 NOTE — Telephone Encounter (Signed)
JUST FYI ONLY AND REMINDER FOR [place] TO DRAW LABS ON PT AT HER VISIT WITH [place] ON 01/18/20:  Dr. Delton See, after you see her in clinic on this Wed 3/17, we need to order labs on her, for she will be seeing her PCP in the next week or so for a physical, and this will save her a trip to have those labs drawn at her office.  PCP really wants her TSH level checked at her visit with [place] on 3/17, so I made a note in appt notes for [place] to order in clinic and send her to lab afterwards.  I also advised her to fast, for I know you will probably want to check her lipids since its been a year.  She will see you in the morning on 3/17 so she is aware to fast.

## 2020-01-18 ENCOUNTER — Other Ambulatory Visit: Payer: Self-pay

## 2020-01-18 ENCOUNTER — Ambulatory Visit (INDEPENDENT_AMBULATORY_CARE_PROVIDER_SITE_OTHER): Payer: Medicare Other | Admitting: Cardiology

## 2020-01-18 ENCOUNTER — Encounter: Payer: Self-pay | Admitting: Cardiology

## 2020-01-18 VITALS — BP 124/78 | HR 110 | Ht 59.0 in | Wt 202.0 lb

## 2020-01-18 DIAGNOSIS — E032 Hypothyroidism due to medicaments and other exogenous substances: Secondary | ICD-10-CM | POA: Diagnosis not present

## 2020-01-18 DIAGNOSIS — Z951 Presence of aortocoronary bypass graft: Secondary | ICD-10-CM

## 2020-01-18 DIAGNOSIS — I10 Essential (primary) hypertension: Secondary | ICD-10-CM | POA: Diagnosis not present

## 2020-01-18 DIAGNOSIS — E785 Hyperlipidemia, unspecified: Secondary | ICD-10-CM | POA: Diagnosis not present

## 2020-01-18 DIAGNOSIS — I5033 Acute on chronic diastolic (congestive) heart failure: Secondary | ICD-10-CM | POA: Diagnosis not present

## 2020-01-18 DIAGNOSIS — I2510 Atherosclerotic heart disease of native coronary artery without angina pectoris: Secondary | ICD-10-CM | POA: Diagnosis not present

## 2020-01-18 NOTE — Progress Notes (Signed)
Cardiology Office Note:    Date:  01/18/2020   ID:  [name], [name] 07-08-1946, MRN [id number]  PCP:  Isaac Bliss, Rayford Halsted, MD  Cardiologist:  Ena Dawley, MD  Electrophysiologist:  None   Referring MD: Isaac Bliss, Est[place]*   Reason for visit: 3 months follow-up  History of Present Illness:    Dondi Burandt is a 74 y.o. female with a hx of NSTEMI--> 3 VD --> emergent CABG in July 2016 when her husband was critically ill and eventually passed away.  She started to use her home oxygen in 2017, she follows with Dr. Melvyn Novas for interstitial lung disease and pulmonary fibrosis, currently on 2 L of oxygen.   06/23/2018 - 6 months follow up, no chest pain, stable DOE, walking with 24/7 oxygen, follows with pulmonary, denies any lower extremity edema palpitations or claudications.  She is tolerating her medications well.  Her complaint is constant urge to go to the bathroom for a #2 and ongoing diarrhea for several months.  Also rectal pain.  10/06/2019 - 1 year follow up, she has been doing okay except she has been having problems with her COPD.  Just 2 days ago she went to urgent care with COPD exacerbation.  I have reviewed her x-ray that does not show any signs of CHF.  She denies any chest pain.  She has been compliant with her meds and has no side effects.  Her blood pressure has been elevated.  She denies any orthopnea or proximal nocturnal dyspnea.  01/18/2020 -she is coming after 3 months, she has been doing well, currently on 3 L of home O2, she started to work with physical therapy at home twice a week and exercises for an hour.  She has not been having any chest pain, stable dyspnea on exertion, she has no lower extremity edema and is able to lay flat at night.  Past Medical History:  Diagnosis Date  . Acute bronchitis 09/25/2015  . ACUTE ON CHRONIC DIASTOLIC HEART FAILURE 01/06/5731   Qualifier: Diagnosis of  By: Lovena Le, MD, [facility], [place]      OA RIGHT KNEE WITH PAIN  . Barrett esophagus   . Bradycardia 06/01/2015  . CHF (congestive heart failure) ([place])   . Chronic respiratory failure ([place])   . Chronic respiratory failure with hypoxia and hypercapnia ([place]) 02/04/2010   Followed in Pulmonary clinic/ [facility]/ [place]       - 02 dependent  since 07/02/10 >>  83% RA December 05, 2010       - ONO RA 08/05/12  :  Positive sat < 89 x 2:68m repeat on 2lpm rec 08/12/2012  - 06/17/2013 reported desat with activity p Knee surgery > rec restart 2lpm with activity  - 06/27/2013   Walked 2lpm  x one lap @ 185 stopped due to sat 88% not sob , desat to 82% on RA just at th  . COPD (chronic obstructive pulmonary disease) ([place]   . COPD III spirometry if use FEV1/VC p saba  07/18/2010   Quit smoking May 2006       - PFT's  04/12/10 FEV1  1.21 (69%) ratio 77 and no change p B2,  DLC0 56%   VC 70%         - PFTs  08/08/2013 FEV1 1.21 (60%) ratio 86 and no change p B2 DLCO 79%  VC 72%  On symbicort 160 2bid  - PFT's  02/08/2018  FEV1  0.70 (40 % ) ratio 56 if use FEV1/VC  p 38 % improvement from saba p symb 160 prior to study with DLCO  78 % corrects to 147  % for alv volume   - 02/08/2018  . Cough variant asthma 02/26/2011   Followed in Pulmonary clinic/ [facility]/ [place]  - PFT's  06/04/15  FEV1 1.20 (67 % ) ratio 83  p 6 % improvement from saba with DLCO  80 % corrects to 132 % for alv volume      - Clinical dx based on response to symbicort       FENO 09/16/2016  =   96 on symbicort 160 2bid > added singulair  Allergy profile 09/16/2016 >  Eos 0.5 /  IgE  78 neg RAST  -  Referred to rehab 04/29/2017 > completed  . DOE (dyspnea on exertion) 02/19/2016  . Essential hypertension 04/20/2007   Qualifier: Diagnosis of  By: Paulina Fusi RN, Daine Gravel   . GERD (gastroesophageal reflux disease)   . History of ARDS 2006  . History of home oxygen therapy    AT NIGHT WHEN SLEEPING 2 L / MIN NASAL CANNULA  . Hyperlipidemia 07/12/2015  . Hypertension   . Hypothyroidism   .  Morbid (severe) obesity due to excess calories ([place]) 04/22/2015   pfts with erv 14% 06/04/15  And 33% 02/08/2018   . NSTEMI (non-ST elevated myocardial infarction) ([place]) 05/31/2015  . Pneumococcal pneumonia ([place]) 2006   HOSPITALIZED AND DEVELOPED ARDS  . Psoriatic arthritis ([place])   . Pulmonary fibrosis ([place])   . PULMONARY FIBROSIS ILD POST INFLAMMATORY CHRONIC 07/18/2010   Followed as Primary Care Patient/ [facility]/ [place]  -s/p ARDS 2006 with bacteremic S  Pna       - CT chest 07/03/10 Nonspecific PF mostly upper lobes       - CT chest 12/03/10 acute gg changes and effusions c/w chf - PFT's  02/08/2018  FVC 0.64 (28 %)   with DLCO  78 % corrects to 147 % for alv volume     . Rheumatic disease   . 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 SOB (shortness of breath) on exertion   . Stroke [facility])     Past Surgical History:  Procedure Laterality Date  . ABDOMINOPLASTY    . CARDIAC CATHETERIZATION N/A 06/01/2015   Procedure: Left Heart Cath and Coronary Angiography;  Surgeon: Lorretta Harp, MD;  Location: [place] [facility] LAB;  Service: Cardiovascular;  Laterality: N/A;  . CARPAL TUNNEL RELEASE    . CHOLECYSTECTOMY    . CORONARY ARTERY BYPASS GRAFT N/A 06/04/2015   Procedure: CORONARY ARTERY BYPASS GRAFT times three            with left internal mammary artery and right leg saphenous vein;  Surgeon: Gaye Pollack, MD;  Location: [place] OR;  Service: Open Heart Surgery;  Laterality: N/A;  . cosmetic breast surgery    . JOINT REPLACEMENT    . KNEE ARTHROSCOPY Left   . TEE WITHOUT CARDIOVERSION  06/04/2015   Procedure: TRANSESOPHAGEAL ECHOCARDIOGRAM (TEE);  Surgeon: Gaye Pollack, MD;  Location[facility];  Service: Open Heart Surgery;;  . TOTAL KNEE ARTHROPLASTY Left   . TOTAL KNEE ARTHROPLASTY Right 06/06/2013   Procedure: RIGHT TOTAL KNEE ARTHROPLASTY;  Surgeon: Gearlean Alf, MD;  Location: [facility] ORS;  Service: Orthopedics;  Laterality: Right;    Current Medications: Current Meds  Medication Sig  . acetaminophen  (TYLENOL ARTHRITIS PAIN) 650 MG  CR tablet Take 1,300 mg by mouth 2 (two) times daily.   Marland Kitchen albuterol (VENTOLIN HFA) 108 (90 Base) MCG/ACT inhaler Inhale 2 puffs into the lungs every 4 (four) hours as needed (shortness of breath, if you can't catch your breath).  Marland Kitchen aspirin 81 MG tablet Take 1 tablet (81 mg total) by mouth daily.  Marland Kitchen atorvastatin (LIPITOR) 40 MG tablet TAKE 1 TABLET BY MOUTH  DAILY AT 6 PM.  . CALCIUM PO Take 1 tablet by mouth daily.  . Cholecalciferol (VITAMIN D3) 2000 UNITS TABS Take 2,000 Int'l Units by mouth daily.  . citalopram (CELEXA) 40 MG tablet TAKE 1 TABLET BY MOUTH IN  THE MORNING  . clonazePAM (KLONOPIN) 0.5 MG tablet Take 0.5 tablets (0.25 mg total) by mouth at bedtime as needed for anxiety.  . cyclobenzaprine (FLEXERIL) 5 MG tablet TAKE 1 TABLET BY MOUTH AT BEDTIME AS NEEDED FOR MUSCLE SPASMS.  Marland Kitchen diltiazem (CARDIZEM CD) 120 MG 24 hr capsule Take 1 capsule (120 mg total) by mouth at bedtime.  . furosemide (LASIX) 20 MG tablet Take 20 mg by mouth daily.   Marland Kitchen golimumab (SIMPONI ARIA) 50 MG/4ML SOLN injection Inject 50 mg into the vein every 8 (eight) weeks.   Marland Kitchen leflunomide (ARAVA) 20 MG tablet Take 1 tablet (20 mg total) by mouth daily.  Marland Kitchen levothyroxine (SYNTHROID) 100 MCG tablet Take 1 tablet (100 mcg total) by mouth daily.  Marland Kitchen losartan (COZAAR) 50 MG tablet TAKE 1 TABLET BY MOUTH  DAILY  . montelukast (SINGULAIR) 10 MG tablet Take 1 tablet (10 mg total) by mouth at bedtime.  . OXYGEN Inhale 2 L into the lungs continuous. continuous o2   . pantoprazole (PROTONIX) 40 MG tablet TAKE 1 TABLET BY MOUTH  DAILY BEFORE BREAKFAST  . SYMBICORT 160-4.5 MCG/ACT inhaler USE 2 INHALATIONS BY MOUTH  TWICE DAILY  . Tiotropium Bromide Monohydrate (SPIRIVA RESPIMAT) 2.5 MCG/ACT AERS 2 puffs each am  . traMADol (ULTRAM) 50 MG tablet Take 1 tablet (50 mg total) by mouth 2 (two) times daily.  Marland Kitchen triamcinolone cream (KENALOG) 0.1 % Apply 1 application topically 2 (two) times daily as needed  (for psoriasis).      Allergies:   Patient has no known allergies.   Social History   Socioeconomic History  . Marital status: Widowed    Spouse name: Not on file  . Number of children: Not on file  . Years of education: Not on file  . Highest education level: Not on file  Occupational History  . Occupation: Designer, television/film set: [employer]  Tobacco Use  . Smoking status: Former Smoker    Packs/day: 1.50    Years: 58.50    Pack years: 87.75    Types: Cigarettes    Quit date: 03/03/2005    Years since quitting: 14.8  . Smokeless tobacco: Never Used  Substance and Sexual Activity  . Alcohol use: No  . Drug use: No  . Sexual activity: Not on file  Other Topics Concern  . Not on file  Social History Narrative  . Not on file   Social Determinants of Health   Financial Resource Strain:   . Difficulty of Paying Living Expenses:   Food Insecurity:   . Worried About Charity fundraiser in the Last Year:   . Arboriculturist in the Last Year:   Transportation Needs:   . Film/video editor (Medical):   Marland Kitchen Lack of Transportation (Non-Medical):   Physical Activity:   .  Days of Exercise per Week:   . Minutes of Exercise per Session:   Stress:   . Feeling of Stress :   Social Connections:   . Frequency of Communication with Friends and Family:   . Frequency of Social Gatherings with Friends and Family:   . Attends Religious Services:   . Active Member of Clubs or Organizations:   . Attends Archivist Meetings:   Marland Kitchen Marital Status:      Family History: The patient's family history includes Breast cancer in her mother; Coronary artery disease in her father; Rheum arthritis in her father.  ROS:   Please see the history of present illness.    All other systems reviewed and are negative.  EKGs/Labs/Other Studies Reviewed:    The following studies were reviewed today: EKG:  EKG is ordered today.  The ekg ordered today demonstrates sinus tachycardia  otherwise normal EKG unchanged from prior.  Personally reviewed.  Recent Labs: 03/11/2019: BUN 20; Creatinine, Ser 0.87; Hemoglobin 10.1; Platelets 153; Potassium 4.5; Sodium 142 06/16/2019: TSH 3.32  Recent Lipid Panel    Component Value Date/Time   CHOL 133 08/25/2018 2019   CHOL 148 11/09/2017 1528   CHOL 117 07/12/2015 1508   TRIG 108 08/25/2018 2019   TRIG 75 07/12/2015 1508   HDL 59 08/25/2018 2019   HDL 67 11/09/2017 1528   HDL 51 07/12/2015 1508   CHOLHDL 2.3 08/25/2018 2019   VLDL 22 08/25/2018 2019   LDLCALC 52 08/25/2018 2019   LDLCALC 58 11/09/2017 1528   LDLCALC 51 07/12/2015 1508   LDLDIRECT 131.1 08/10/2012 0829    Physical Exam:    VS:  BP 124/78   Pulse (!) 110   Ht 4' 11"  (1.499 m)   Wt 202 lb (91.6 kg)   SpO2 98%   BMI 40.80 kg/m     Wt Readings from Last 3 Encounters:  01/18/20 202 lb (91.6 kg)  11/08/19 204 lb 3.2 oz (92.6 kg)  10/06/19 202 lb (91.6 kg)     GEN: Well nourished, well developed in no acute distress HEENT: Normal NECK: No JVD; No carotid bruits LYMPHATICS: No lymphadenopathy CARDIAC: RRR, no murmurs, rubs, gallops RESPIRATORY:  Clear to auscultation without rales, wheezing or rhonchi  ABDOMEN: Soft, non-tender, non-distended MUSCULOSKELETAL:  No edema; No deformity  SKIN: Warm and dry NEUROLOGIC:  Alert and oriented x 3 PSYCHIATRIC:  Normal affect    ASSESSMENT:    1. Coronary artery disease involving native coronary artery of native heart without angina pectoris   2. S/P CABG x 3   3. Hypothyroidism due to medication   4. Essential hypertension   5. Hyperlipidemia, unspecified hyperlipidemia type   6. Acute on chronic diastolic heart failure (HCC)    PLAN:    In order of problems listed above:  1. CAD, s/p NSTEMI (non-ST elevated myocardial infarction), 3-VD with preserved LV function. Repeat echo showed LVEF 60-65%. On aspirin and atorvastatin, losartan.  She is asymptomatic tolerating all the medications well.   Functional capacity very limited with regards to COPD.  No recent chest pain, EKG normal other than being tachycardic.  2. Dyspnea - stable- chronic pulmonary interstitial lung disease with fibrosis on home O2, on 2 L of oxygen and recent COPD exacerbation requiring IV steroids.  She continues to take Spiriva and Symbicort.  3. Acute on chronic diastolic dysfunction - on Lasix 20 mg by mouth daily, afternoon one PRN.  She appears euvolemic.  4. HTN -blood pressure is  well controlled.  5. Hyperlipidemia - on atorvastatin, tolerated.  6. Hypothyroidism -she is in sinus tachycardia will obtain TSH free T3 and free T4 today.    Medication Adjustments/Labs and Tests Ordered: Current medicines are reviewed at length with the patient today.  Concerns regarding medicines are outlined above.   Follow-up in 6 months, will obtain labs including CBC, CMP, BNP, TSH, lipids, 43 and free T4 today.  Orders Placed This Encounter  Procedures  . Comp Met (CMET)  . Pro b natriuretic peptide  . CBC  . TSH  . T3, free  . T4, free  . Lipid Profile  . EKG 12-Lead   No orders of the defined types were placed in this encounter.  Patient Instructions  Medication Instructions:   Your physician recommends that you continue on your current medications as directed. Please refer to the Current Medication list given to you today.  *If you need a refill on your cardiac medications before your next appointment, please call your pharmacy*   Lab Work:  TODAY--CMET, CBC, TSH, FREE T3, FREE T4, PRO-BNP, AND LIPIDS  If you have labs (blood work) drawn today and your tests are completely normal, you will receive your results only by: Marland Kitchen MyChart Message (if you have MyChart) OR . A paper copy in the mail If you have any lab test that is abnormal or we need to change your treatment, we will call you to review the results.   Follow-Up: At [facility], you and your health needs are our priority.  As part of  our continuing mission to provide you with exceptional heart care, we have created designated Provider Care Teams.  These Care Teams include your primary Cardiologist (physician) and Advanced Practice Providers (APPs -  Physician Assistants and Nurse Practitioners) who all work together to provide you with the care you need, when you need it.  We recommend signing up for the patient portal called "MyChart".  Sign up information is provided on this After Visit Summary.  MyChart is used to connect with patients for Virtual Visits (Telemedicine).  Patients are able to view lab/test results, encounter notes, upcoming appointments, etc.  Non-urgent messages can be sent to your provider as well.   To learn more about what you can do with MyChart, go to [email or web address].    Your next appointment:   6 month(s)  The format for your next appointment:   In Person  Provider:   Ena Dawley, MD       Signed, Ena Dawley, MD  01/18/2020 11:13 AM    [place]

## 2020-01-18 NOTE — Patient Instructions (Signed)
Medication Instructions:   Your physician recommends that you continue on your current medications as directed. Please refer to the Current Medication list given to you today.  *If you need a refill on your cardiac medications before your next appointment, please call your pharmacy*   Lab Work:  TODAY--CMET, CBC, TSH, FREE T3, FREE T4, PRO-BNP, AND LIPIDS  If you have labs (blood work) drawn today and your tests are completely normal, you will receive your results only by: Marland Kitchen MyChart Message (if you have MyChart) OR . A paper copy in the mail If you have any lab test that is abnormal or we need to change your treatment, we will call you to review the results.   Follow-Up: At [facility], you and your health needs are our priority.  As part of our continuing mission to provide you with exceptional heart care, we have created designated Provider Care Teams.  These Care Teams include your primary Cardiologist (physician) and Advanced Practice Providers (APPs -  Physician Assistants and Nurse Practitioners) who all work together to provide you with the care you need, when you need it.  We recommend signing up for the patient portal called "MyChart".  Sign up information is provided on this After Visit Summary.  MyChart is used to connect with patients for Virtual Visits (Telemedicine).  Patients are able to view lab/test results, encounter notes, upcoming appointments, etc.  Non-urgent messages can be sent to your provider as well.   To learn more about what you can do with MyChart, go to [email or web address].    Your next appointment:   6 month(s)  The format for your next appointment:   In Person  Provider:   Tobias Alexander, MD

## 2020-01-19 ENCOUNTER — Telehealth: Payer: Self-pay | Admitting: *Deleted

## 2020-01-19 LAB — COMPREHENSIVE METABOLIC PANEL
ALT: 15 IU/L (ref 0–32)
AST: 23 IU/L (ref 0–40)
Albumin/Globulin Ratio: 1.6 (ref 1.2–2.2)
Albumin: 4.2 g/dL (ref 3.7–4.7)
Alkaline Phosphatase: 63 IU/L (ref 39–117)
BUN/Creatinine Ratio: 19 (ref 12–28)
BUN: 23 mg/dL (ref 8–27)
Bilirubin Total: 0.3 mg/dL (ref 0.0–1.2)
CO2: 31 mmol/L — ABNORMAL HIGH (ref 20–29)
Calcium: 9.9 mg/dL (ref 8.7–10.3)
Chloride: 96 mmol/L (ref 96–106)
Creatinine, Ser: 1.19 mg/dL — ABNORMAL HIGH (ref 0.57–1.00)
GFR calc Af Amer: 53 mL/min/{1.73_m2} — ABNORMAL LOW (ref >59)
GFR calc non Af Amer: 46 mL/min/{1.73_m2} — ABNORMAL LOW (ref >59)
Globulin, Total: 2.6 g/dL (ref 1.5–4.5)
Glucose: 105 mg/dL — ABNORMAL HIGH (ref 65–99)
Potassium: 4.5 mmol/L (ref 3.5–5.2)
Sodium: 143 mmol/L (ref 134–144)
Total Protein: 6.8 g/dL (ref 6.0–8.5)

## 2020-01-19 LAB — CBC
Hematocrit: 34.6 % (ref 34.0–46.6)
Hemoglobin: 10.9 g/dL — ABNORMAL LOW (ref 11.1–15.9)
MCH: 27.7 pg (ref 26.6–33.0)
MCHC: 31.5 g/dL (ref 31.5–35.7)
MCV: 88 fL (ref 79–97)
Platelets: 185 10*3/uL (ref 150–450)
RBC: 3.93 x10E6/uL (ref 3.77–5.28)
RDW: 13.5 % (ref 11.7–15.4)
WBC: 7.5 10*3/uL (ref 3.4–10.8)

## 2020-01-19 LAB — LIPID PANEL
Chol/HDL Ratio: 2.9 ratio (ref 0.0–4.4)
Cholesterol, Total: 164 mg/dL (ref 100–199)
HDL: 56 mg/dL (ref >39)
LDL Chol Calc (NIH): 87 mg/dL (ref 0–99)
Triglycerides: 117 mg/dL (ref 0–149)
VLDL Cholesterol Cal: 21 mg/dL (ref 5–40)

## 2020-01-19 LAB — TSH: TSH: 7.62 u[IU]/mL — ABNORMAL HIGH (ref 0.450–4.500)

## 2020-01-19 LAB — T4, FREE: Free T4: 1.07 ng/dL (ref 0.82–1.77)

## 2020-01-19 LAB — PRO B NATRIURETIC PEPTIDE: NT-Pro BNP: 331 pg/mL — ABNORMAL HIGH (ref 0–301)

## 2020-01-19 LAB — T3, FREE: T3, Free: 2.5 pg/mL (ref 2.0–4.4)

## 2020-01-19 MED ORDER — FUROSEMIDE 20 MG PO TABS: 20.0000 mg | ORAL_TABLET | ORAL | 1 refills | Status: DC

## 2020-01-19 NOTE — Telephone Encounter (Signed)
Pt informed of lab results and recommendations per Dr. Delton See. Informed the pt that per Dr. Delton See, we will need to decrease her lasix to 20 mg po every other day.  Pt education provided, as well as advised her to decrease her salt intake. Also informed the pt that her TSH is mildly elevated, but her Free T3 and Free T4 were normal, so she has decided to not make any changes with her synthroid, but we will send these labs to her PCP Dr. Ardyth Harps, so that she can further follow-up and manage this.  Informed the pt that I will send a copy of these results to her PCP, so that she will have them to further advise on.  Advised the pt that she should also call her PCP's office to make them aware of lab results being sent, and that Dr. Delton See would like for Dr. Ardyth Harps to further manage her thyroid.  Confirmed the pharmacy of choice with the pt, for her lasix.  Pt states she is going to send her PCP a message in mychart that labs are available to review and they are being routed to her per Dr. Delton See. Pt verbalized understanding and agrees with this plan.

## 2020-01-19 NOTE — Telephone Encounter (Signed)
-----   Message from Lars Masson, MD sent at 01/19/2020  9:29 AM EDT ----- Please advise to take lasix 20 mg po just every other day. TSH Mildly elevated, but normal fT3,4, I would do any changes and send these labs to her PCP. All other labs are good

## 2020-01-20 ENCOUNTER — Encounter: Payer: Self-pay | Admitting: Internal Medicine

## 2020-01-20 ENCOUNTER — Ambulatory Visit: Payer: Medicare Other | Admitting: Cardiology

## 2020-01-20 NOTE — Telephone Encounter (Signed)
Spoke with patient and a virtual visit was scheduled.   

## 2020-01-24 ENCOUNTER — Other Ambulatory Visit: Payer: Self-pay

## 2020-01-24 ENCOUNTER — Telehealth (INDEPENDENT_AMBULATORY_CARE_PROVIDER_SITE_OTHER): Payer: Medicare Other | Admitting: Internal Medicine

## 2020-01-24 DIAGNOSIS — E039 Hypothyroidism, unspecified: Secondary | ICD-10-CM

## 2020-01-24 DIAGNOSIS — I2510 Atherosclerotic heart disease of native coronary artery without angina pectoris: Secondary | ICD-10-CM

## 2020-01-24 NOTE — Progress Notes (Signed)
Virtual Visit via Video Note  I connected with [name] on 01/24/20 at  2:00 PM EDT by a video enabled telemedicine application and verified that I am speaking with the correct person using two identifiers.  Location patient: home Location provider: work office Persons participating in the virtual visit: patient, provider  I discussed the limitations of evaluation and management by telemedicine and the availability of in person appointments. The patient expressed understanding and agreed to proceed.   HPI: She has scheduled this visit to discuss her Synthroid dosing.  She had labs done with her cardiologist and her TSH was found to be elevated at 7.620.  She has been on 100 mcg of Synthroid.  10 months ago, she was on 125 mcg and dose was decreased to 100 after an over suppressed TSH was found on lab work.  She has been taking her Synthroid first thing in the morning with coffee and all of her other medications.  She has otherwise been doing well.  Has been working with physical therapy.   ROS: Constitutional: Denies fever, chills, diaphoresis, appetite change and fatigue.  HEENT: Denies photophobia, eye pain, redness, hearing loss, ear pain, congestion, sore throat, rhinorrhea, sneezing, mouth sores, trouble swallowing, neck pain, neck stiffness and tinnitus.   Respiratory: Denies SOB, DOE, cough, chest tightness,  and wheezing.   Cardiovascular: Denies chest pain, palpitations and leg swelling.  Gastrointestinal: Denies nausea, vomiting, abdominal pain, diarrhea, constipation, blood in stool and abdominal distention.  Genitourinary: Denies dysuria, urgency, frequency, hematuria, flank pain and difficulty urinating.  Endocrine: Denies: hot or cold intolerance, sweats, changes in hair or nails, polyuria, polydipsia. Musculoskeletal: Denies myalgias, back pain, joint swelling, arthralgias and gait problem.  Skin: Denies pallor, rash and wound.  Neurological: Denies  dizziness, seizures, syncope, weakness, light-headedness, numbness and headaches.  Hematological: Denies adenopathy. Easy bruising, personal or family bleeding history  Psychiatric/Behavioral: Denies suicidal ideation, mood changes, confusion, nervousness, sleep disturbance and agitation   Past Medical History:  Diagnosis Date  . Acute bronchitis 09/25/2015  . ACUTE ON CHRONIC DIASTOLIC HEART FAILURE 02/10/4495   Qualifier: Diagnosis of  By: Lovena Le, MD, [facility], [place]    OA RIGHT KNEE WITH PAIN  . Barrett esophagus   . Bradycardia 06/01/2015  . CHF (congestive heart failure) ([place])   . Chronic respiratory failure ([place])   . Chronic respiratory failure with hypoxia and hypercapnia ([place]) 02/04/2010   Followed in Pulmonary clinic/ [facility]/ [place]       - 02 dependent  since 07/02/10 >>  83% RA December 05, 2010       - ONO RA 08/05/12  :  Positive sat < 89 x 2:57m> repeat on 2lpm rec 08/12/2012  - 06/17/2013 reported desat with activity p Knee surgery > rec restart 2lpm with activity  - 06/27/2013   Walked 2lpm  x one lap @ 185 stopped due to sat 88% not sob , desat to 82% on RA just at th  . COPD (chronic obstructive pulmonary disease) ([place])   . COPD III spirometry if use FEV1/VC p saba  07/18/2010   Quit smoking May 2006       - PFT's  04/12/10 FEV1  1.21 (69%) ratio 77 and no change p B2,  DLC0 56%   VC 70%         - PFTs  08/08/2013 FEV1 1.21 (60%) ratio 86 and no change p B2 DLCO 79%  VC 72%  On  symbicort 160 2bid  - PFT's  02/08/2018  FEV1 0.70 (40 % ) ratio 56 if use FEV1/VC  p 38 % improvement from saba p symb 160 prior to study with DLCO  78 % corrects to 147  % for alv volume   - 02/08/2018  . Cough variant asthma 02/26/2011   Followed in Pulmonary clinic/ [facility]/ [place]  - PFT's  06/04/15  FEV1 1.20 (67 % ) ratio 83  p 6 % improvement from saba with DLCO  80 % corrects to 132 % for alv volume      - Clinical dx based on response to symbicort       FENO 09/16/2016  =    96 on symbicort 160 2bid > added singulair  Allergy profile 09/16/2016 >  Eos 0.5 /  IgE  78 neg RAST  -  Referred to rehab 04/29/2017 > completed  . DOE (dyspnea on exertion) 02/19/2016  . Essential hypertension 04/20/2007   Qualifier: Diagnosis of  By: Marcelyn Ditty RN, Katy Fitch   . GERD (gastroesophageal reflux disease)   . History of ARDS 2006  . History of home oxygen therapy    AT NIGHT WHEN SLEEPING 2 L / MIN NASAL CANNULA  . Hyperlipidemia 07/12/2015  . Hypertension   . Hypothyroidism   . Morbid (severe) obesity due to excess calories (HCC) 04/22/2015   pfts with erv 14% 06/04/15  And 33% 02/08/2018   . NSTEMI (non-ST elevated myocardial infarction) (HCC) 05/31/2015  . Pneumococcal pneumonia (HCC) 2006   HOSPITALIZED AND DEVELOPED ARDS  . Psoriatic arthritis (HCC)   . Pulmonary fibrosis (HCC)   . PULMONARY FIBROSIS ILD POST INFLAMMATORY CHRONIC 07/18/2010   Followed as Primary Care Patient/ [facility]/ [place]  -s/p ARDS 2006 with bacteremic S  Pna       - CT chest 07/03/10 Nonspecific PF mostly upper lobes       - CT chest 12/03/10 acute gg changes and effusions c/w chf - PFT's  02/08/2018  FVC 0.64 (28 %)   with DLCO  78 % corrects to 147 % for alv volume     . Rheumatic disease   . 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 SOB (shortness of breath) on exertion   . Stroke [facility])     Past Surgical History:  Procedure Laterality Date  . ABDOMINOPLASTY    . CARDIAC CATHETERIZATION N/A 06/01/2015   Procedure: Left Heart Cath and Coronary Angiography;  Surgeon: Runell Gess, MD;  Location: [facility] INVASIVE CV LAB;  Service: Cardiovascular;  Laterality: N/A;  . CARPAL TUNNEL RELEASE    . CHOLECYSTECTOMY    . CORONARY ARTERY BYPASS GRAFT N/A 06/04/2015   Procedure: CORONARY ARTERY BYPASS GRAFT times three            with left internal mammary artery and right leg saphenous vein;  Surgeon: Alleen Borne, MD;  Location: [facility] OR;  Service: Open Heart Surgery;  Laterality: N/A;  . cosmetic breast surgery    . JOINT  REPLACEMENT    . KNEE ARTHROSCOPY Left   . TEE WITHOUT CARDIOVERSION  06/04/2015   Procedure: TRANSESOPHAGEAL ECHOCARDIOGRAM (TEE);  Surgeon: Alleen Borne, MD;  Location: [facility] Main OR;  Service: Open Heart Surgery;;  . TOTAL KNEE ARTHROPLASTY Left   . TOTAL KNEE ARTHROPLASTY Right 06/06/2013   Procedure: RIGHT TOTAL KNEE ARTHROPLASTY;  Surgeon: Loanne Drilling, MD;  Location: [facility] ORS;  Service: Orthopedics;  Laterality: Right;    Family History  Problem Relation  Age of Onset  . Breast cancer Mother   . Coronary artery disease Father   . Rheum arthritis Father     SOCIAL HX:   reports that she quit smoking about 14 years ago. Her smoking use included cigarettes. She has a 87.75 pack-year smoking history. She has never used smokeless tobacco. She reports that she does not drink alcohol or use drugs.   Current Outpatient Medications:  .  acetaminophen (TYLENOL ARTHRITIS PAIN) 650 MG CR tablet, Take 1,300 mg by mouth 2 (two) times daily. , Disp: , Rfl:  .  albuterol (VENTOLIN HFA) 108 (90 Base) MCG/ACT inhaler, Inhale 2 puffs into the lungs every 4 (four) hours as needed (shortness of breath, if you can't catch your breath)., Disp: , Rfl:  .  aspirin 81 MG tablet, Take 1 tablet (81 mg total) by mouth daily., Disp: 30 tablet, Rfl: 0 .  atorvastatin (LIPITOR) 40 MG tablet, TAKE 1 TABLET BY MOUTH  DAILY AT 6 PM., Disp: 90 tablet, Rfl: 2 .  CALCIUM PO, Take 1 tablet by mouth daily., Disp: , Rfl:  .  Cholecalciferol (VITAMIN D3) 2000 UNITS TABS, Take 2,000 Int'l Units by mouth daily., Disp: , Rfl:  .  citalopram (CELEXA) 40 MG tablet, TAKE 1 TABLET BY MOUTH IN  THE MORNING, Disp: 90 tablet, Rfl: 3 .  clonazePAM (KLONOPIN) 0.5 MG tablet, Take 0.5 tablets (0.25 mg total) by mouth at bedtime as needed for anxiety., Disp: 20 tablet, Rfl: 1 .  cyclobenzaprine (FLEXERIL) 5 MG tablet, TAKE 1 TABLET BY MOUTH AT BEDTIME AS NEEDED FOR MUSCLE SPASMS., Disp: 20 tablet, Rfl: 0 .  diltiazem (CARDIZEM CD) 120 MG 24 hr  capsule, Take 1 capsule (120 mg total) by mouth at bedtime., Disp: 90 capsule, Rfl: 1 .  furosemide (LASIX) 20 MG tablet, Take 1 tablet (20 mg total) by mouth every other day., Disp: 45 tablet, Rfl: 1 .  golimumab (SIMPONI ARIA) 50 MG/4ML SOLN injection, Inject 50 mg into the vein every 8 (eight) weeks. , Disp: , Rfl:  .  leflunomide (ARAVA) 20 MG tablet, Take 1 tablet (20 mg total) by mouth daily., Disp: , Rfl:  .  levothyroxine (SYNTHROID) 100 MCG tablet, Take 1 tablet (100 mcg total) by mouth daily., Disp: 90 tablet, Rfl: 1 .  losartan (COZAAR) 50 MG tablet, TAKE 1 TABLET BY MOUTH  DAILY, Disp: 90 tablet, Rfl: 3 .  montelukast (SINGULAIR) 10 MG tablet, Take 1 tablet (10 mg total) by mouth at bedtime., Disp: 90 tablet, Rfl: 3 .  OXYGEN, Inhale 2 L into the lungs continuous. continuous o2 , Disp: , Rfl:  .  pantoprazole (PROTONIX) 40 MG tablet, TAKE 1 TABLET BY MOUTH  DAILY BEFORE BREAKFAST, Disp: 90 tablet, Rfl: 3 .  SYMBICORT 160-4.5 MCG/ACT inhaler, USE 2 INHALATIONS BY MOUTH  TWICE DAILY, Disp: 3 Inhaler, Rfl: 6 .  Tiotropium Bromide Monohydrate (SPIRIVA RESPIMAT) 2.5 MCG/ACT AERS, 2 puffs each am, Disp: 12 g, Rfl: 3 .  traMADol (ULTRAM) 50 MG tablet, Take 1 tablet (50 mg total) by mouth 2 (two) times daily., Disp: 60 tablet, Rfl: 1 .  triamcinolone cream (KENALOG) 0.1 %, Apply 1 application topically 2 (two) times daily as needed (for psoriasis). , Disp: , Rfl:   EXAM:   VITALS per patient if applicable: None reported  GENERAL: alert, oriented, appears well and in no acute distress  HEENT: atraumatic, conjunttiva clear, no obvious abnormalities on inspection of external nose and ears, wears corrective lenses  NECK: normal  movements of the head and neck  LUNGS: on inspection no signs of respiratory distress, breathing rate appears normal, no obvious gross increased work of breathing, gasping or wheezing  CV: no obvious cyanosis  MS: moves all visible extremities without noticeable  abnormality  PSYCH/NEURO: pleasant and cooperative, no obvious depression or anxiety, speech and thought processing grossly intact  ASSESSMENT AND PLAN:   Hypothyroidism, unspecified type -Have instructed her to take Synthroid first thing in the morning at least 30 minutes before any other medications or food. -She will return in 6 weeks for repeat TSH to determine if dosing needs to be adjusted any further.    I discussed the assessment and treatment plan with the patient. The patient was provided an opportunity to ask questions and all were answered. The patient agreed with the plan and demonstrated an understanding of the instructions.   The patient was advised to call back or seek an in-person evaluation if the symptoms worsen or if the condition fails to improve as anticipated.    Chaya Jan, MD  [facility] at [facility]

## 2020-01-25 ENCOUNTER — Other Ambulatory Visit: Payer: Self-pay | Admitting: Internal Medicine

## 2020-01-25 DIAGNOSIS — E039 Hypothyroidism, unspecified: Secondary | ICD-10-CM

## 2020-02-06 DIAGNOSIS — Z79899 Other long term (current) drug therapy: Secondary | ICD-10-CM | POA: Diagnosis not present

## 2020-02-06 DIAGNOSIS — M0589 Other rheumatoid arthritis with rheumatoid factor of multiple sites: Secondary | ICD-10-CM | POA: Diagnosis not present

## 2020-02-13 ENCOUNTER — Encounter: Payer: Self-pay | Admitting: Internal Medicine

## 2020-02-13 DIAGNOSIS — M5441 Lumbago with sciatica, right side: Secondary | ICD-10-CM

## 2020-02-14 MED ORDER — CYCLOBENZAPRINE HCL 5 MG PO TABS: ORAL_TABLET | ORAL | 0 refills | Status: DC

## 2020-02-21 ENCOUNTER — Telehealth: Payer: Self-pay | Admitting: Internal Medicine

## 2020-02-21 DIAGNOSIS — J441 Chronic obstructive pulmonary disease with (acute) exacerbation: Secondary | ICD-10-CM

## 2020-02-21 DIAGNOSIS — I10 Essential (primary) hypertension: Secondary | ICD-10-CM

## 2020-02-21 DIAGNOSIS — E039 Hypothyroidism, unspecified: Secondary | ICD-10-CM

## 2020-02-21 DIAGNOSIS — E782 Mixed hyperlipidemia: Secondary | ICD-10-CM

## 2020-02-21 NOTE — Progress Notes (Signed)
  Chronic Care Management   Note  02/21/2020 Name: [name] MRN: [id number] DOB: 01/23/1946  [name] is a 74 y.o. year old female who is a primary care patient of [name], [name], MD. I reached out to [name] by phone today in response to a referral sent by [name]'s PCP, [name], [name], MD.   [name] was given information about Chronic Care Management services today including:  1. CCM service includes personalized support from designated clinical staff supervised by her physician, including individualized plan of care and coordination with other care providers 2. 24/7 contact phone numbers for assistance for urgent and routine care needs. 3. Service will only be billed when office clinical staff spend 20 minutes or more in a month to coordinate care. 4. Only one practitioner may furnish and bill the service in a calendar month. 5. The patient may stop CCM services at any time (effective at the end of the month) by phone call to the office staff.   Patient agreed to services and verbal consent obtained.   Follow up plan:   Raynicia Dukes [employer] Scheduler

## 2020-02-21 NOTE — Progress Notes (Signed)
  Chronic Care Management   Outreach Note  02/21/2020 Name: [name] MRN: [id number] DOB: 04-May-1946  Referred by: [name], [name], MD Reason for referral : No chief complaint on file.   An unsuccessful telephone outreach was attempted today. The patient was referred to the pharmacist for assistance with care management and care coordination.   Follow Up Plan:   Raynicia Dukes [employer] Scheduler

## 2020-02-22 DIAGNOSIS — L409 Psoriasis, unspecified: Secondary | ICD-10-CM | POA: Diagnosis not present

## 2020-02-22 DIAGNOSIS — Z6841 Body Mass Index (BMI) 40.0 and over, adult: Secondary | ICD-10-CM | POA: Diagnosis not present

## 2020-02-22 DIAGNOSIS — M150 Primary generalized (osteo)arthritis: Secondary | ICD-10-CM | POA: Diagnosis not present

## 2020-02-22 DIAGNOSIS — M8580 Other specified disorders of bone density and structure, unspecified site: Secondary | ICD-10-CM | POA: Diagnosis not present

## 2020-02-22 DIAGNOSIS — E669 Obesity, unspecified: Secondary | ICD-10-CM | POA: Diagnosis not present

## 2020-02-22 DIAGNOSIS — I252 Old myocardial infarction: Secondary | ICD-10-CM | POA: Diagnosis not present

## 2020-02-22 DIAGNOSIS — M0589 Other rheumatoid arthritis with rheumatoid factor of multiple sites: Secondary | ICD-10-CM | POA: Diagnosis not present

## 2020-02-22 DIAGNOSIS — Z79899 Other long term (current) drug therapy: Secondary | ICD-10-CM | POA: Diagnosis not present

## 2020-03-05 ENCOUNTER — Other Ambulatory Visit: Payer: Self-pay

## 2020-03-06 ENCOUNTER — Other Ambulatory Visit (INDEPENDENT_AMBULATORY_CARE_PROVIDER_SITE_OTHER): Payer: Medicare Other

## 2020-03-06 DIAGNOSIS — E039 Hypothyroidism, unspecified: Secondary | ICD-10-CM

## 2020-03-06 LAB — TSH: TSH: 93.66 u[IU]/mL — ABNORMAL HIGH (ref 0.35–4.50)

## 2020-03-07 ENCOUNTER — Other Ambulatory Visit: Payer: Self-pay | Admitting: Internal Medicine

## 2020-03-07 ENCOUNTER — Encounter: Payer: Self-pay | Admitting: Internal Medicine

## 2020-03-07 DIAGNOSIS — E039 Hypothyroidism, unspecified: Secondary | ICD-10-CM

## 2020-03-07 MED ORDER — LEVOTHYROXINE SODIUM 125 MCG PO TABS: 125.0000 ug | ORAL_TABLET | Freq: Every day | ORAL | 1 refills | Status: DC

## 2020-03-16 NOTE — Addendum Note (Signed)
Addended by: Kern Reap B on: 03/16/2020 07:08 AM   Modules accepted: Orders

## 2020-03-23 ENCOUNTER — Other Ambulatory Visit: Payer: Self-pay

## 2020-03-23 ENCOUNTER — Ambulatory Visit: Payer: Medicare Other

## 2020-03-23 DIAGNOSIS — I10 Essential (primary) hypertension: Secondary | ICD-10-CM

## 2020-03-23 DIAGNOSIS — E782 Mixed hyperlipidemia: Secondary | ICD-10-CM

## 2020-03-23 DIAGNOSIS — E039 Hypothyroidism, unspecified: Secondary | ICD-10-CM

## 2020-03-23 DIAGNOSIS — J449 Chronic obstructive pulmonary disease, unspecified: Secondary | ICD-10-CM

## 2020-03-23 DIAGNOSIS — F332 Major depressive disorder, recurrent severe without psychotic features: Secondary | ICD-10-CM

## 2020-03-23 NOTE — Chronic Care Management (AMB) (Signed)
Chronic Care Management Pharmacy  Name: [name]  MRN: [id number] DOB: 15-Sep-1946  Initial Questions: 1. Have you seen any other providers since your last visit? NA 2. Any changes in your medicines or health? No   Chief Complaint/ HPI  [name],  74 y.o. , female presents for their Initial CCM visit with the clinical pharmacist via telephone due to COVID-19 Pandemic.  PCP : [name], [name]  Their chronic conditions include: COPD,  diastolic HF, NSTeMI, s/p CABG, HTN, HLD, Psoriatic arthritis, Pain, Depressive disorder, Hypothyroidism, GERD, Barrett's esophagus  Office Visits: 01/24/2020- Amber Jan, Huff- Patient presented for video visit to discuss levothyroxine dosing. Patient instructed to take levothyroxine first thing in the morning at least 30 minutes before any medications or food. Patient to return in 6 weeks for repeat TSH.   Consult Visit: 01/18/2020- Cardiology- Amber Alexander, Huff- Patient presented for office visit for 3 month follow up (CAD, s/p NSTEMI; dyspnea, acute on chronic diastolic dysfunction, HTN, HLD hypothryoid). No medication changes. Patient to obtain labs: CBC, CMP, BNP, TSH, lipids, free T4 and T3.   Medications: Outpatient Encounter Medications as of 03/23/2020  Medication Sig  . acetaminophen (TYLENOL ARTHRITIS PAIN) 650 MG CR tablet Take 650 mg by mouth 2 (two) times daily.   Marland Kitchen albuterol (VENTOLIN HFA) 108 (90 Base) MCG/ACT inhaler Inhale 2 puffs into the lungs every 4 (four) hours as needed (shortness of breath, if you can't catch your breath).  Marland Kitchen aspirin 81 MG tablet Take 1 tablet (81 mg total) by mouth daily.  Marland Kitchen atorvastatin (LIPITOR) 40 MG tablet TAKE 1 TABLET BY MOUTH  DAILY AT 6 PM.  . CALCIUM PO Take 1 tablet by mouth daily.  . Cholecalciferol (VITAMIN D3) 2000 UNITS TABS Take 2,000 Int'l Units by mouth daily.  . citalopram (CELEXA) 40 MG tablet TAKE 1 TABLET BY MOUTH IN  THE MORNING  .  cyclobenzaprine (FLEXERIL) 5 MG tablet TAKE 1 TABLET BY MOUTH AT BEDTIME AS NEEDED FOR MUSCLE SPASMS.  Marland Kitchen diltiazem (CARDIZEM CD) 120 MG 24 hr capsule Take 1 capsule (120 mg total) by mouth at bedtime.  . furosemide (LASIX) 20 MG tablet Take 1 tablet (20 mg total) by mouth every other day.  Marland Kitchen golimumab (SIMPONI ARIA) 50 MG/4ML SOLN injection Inject 50 mg into the vein every 8 (eight) weeks.   Marland Kitchen leflunomide (ARAVA) 20 MG tablet Take 1 tablet (20 mg total) by mouth daily.  Marland Kitchen levothyroxine (SYNTHROID) 125 MCG tablet Take 1 tablet (125 mcg total) by mouth daily.  Marland Kitchen losartan (COZAAR) 50 MG tablet TAKE 1 TABLET BY MOUTH  DAILY  . montelukast (SINGULAIR) 10 MG tablet Take 1 tablet (10 mg total) by mouth at bedtime.  . OXYGEN Inhale 2 L into the lungs continuous. continuous o2   . pantoprazole (PROTONIX) 40 MG tablet TAKE 1 TABLET BY MOUTH  DAILY BEFORE BREAKFAST  . predniSONE (DELTASONE) 10 MG tablet TAKE 4 EACH AM X 2 DAYS, 2 EACH AM X 2 DAYS, 1 EACH AM X 2 DAYS AND STOP  . SYMBICORT 160-4.5 MCG/ACT inhaler USE 2 INHALATIONS BY MOUTH  TWICE DAILY  . Tiotropium Bromide Monohydrate (SPIRIVA RESPIMAT) 2.5 MCG/ACT AERS 2 puffs each am  . traMADol (ULTRAM) 50 MG tablet Take 1 tablet (50 mg total) by mouth 2 (two) times daily.  Marland Kitchen triamcinolone cream (KENALOG) 0.1 % Apply 1 application topically 2 (two) times daily as needed (for psoriasis).   . clonazePAM (KLONOPIN) 0.5 MG tablet Take  0.5 tablets (0.25 mg total) by mouth at bedtime as needed for anxiety. (Patient not taking: Reported on 03/23/2020)   No facility-administered encounter medications on file as of 03/23/2020.    Current Diagnosis/Assessment:  Goals Addressed            This Visit's Progress   . Pharmacy Care Plan       CARE PLAN ENTRY  Current Barriers:  . Chronic Disease Management support, education, and care coordination needs related to Hypertension, Hyperlipidemia, Heart Failure, COPD, Hypothyroidism, and Depression     Hypertension . Pharmacist Clinical Goal(s): o Over the next 90 days, patient will work with PharmD and providers to maintain BP goal <130/80 . Current regimen:   Losartan 50mg ,1 tablet once daily   Diltiazem (Cardizem CD) 120mg , 1 capsule at bedtime  . Interventions: . Discussed diet modifications. DASH diet:  following a diet emphasizing fruits and vegetables and low-fat dairy products along with whole grains, fish, poultry, and nuts. Reducing red meats and sugars.  . Patient self care activities - Over the next 90 days, patient will: o Check BP as directed, document, and provide at future appointments o Ensure daily salt intake < 1500 mg/day  Hyperlipidemia . Pharmacist Clinical Goal(s): o Over the next 90 days, patient will work with PharmD and providers to maintain LDL goal < 70 . Current regimen:  o Atorvastatin 40mg , 1 tablet once daily at 6 pm . Patient self care activities - Over the next 90 days, patient will: o Continue current medications.   COPD . Pharmacist Clinical Goal(s) o Over the next 90 days, patient will work with PharmD and providers to Prevent worsening of shortness of breath and hospitalizations. . Current regimen:   Albuterol (Ventolin HFA) 135mcg/ act inhaler, inhale 2 puffs every four hours as needed for shortness of breath  Montelukast 10mg , 1 tablet at bedtime   Symbicort 160/4.5 mcg/act inhaler, use 2 inhalations twice daily   Tiotropium (Spiriva Respimat) 2.5 mcg/ act, inhale 2 puffs every morning  . Patient self care activities - Over the next 90 days, patient will: o Continue current medications and continue follow up visits with Dr. (pulmonologist).   Depression . Pharmacist Clinical Goal(s) o Over the next 90 days, patient will work with PharmD and providers to Improve mood/ depression symptoms . Current regimen:   Citalopram 40mg , 1 tablet in the morning  . Interventions: o Discussed taper plan to discontinue citalopram.   . Patient self care activities o Patient will continue current medications and discuss further with Dr. .   Hypothyroidism . Pharmacist Clinical Goal(s) o Over the next 90 days, patient will work with PharmD and providers to maintain TSH: 0.35 to 4.50 uIU/mL . Current regimen:  o Levothyroxine 100m, 1 tablet once daily  . Interventions: o We discussed:  consistent administration of levothyroxine (at least 30 minutes before breakfast on an empty stomach).  . Patient self care activities o Patient will continue current medication and follow up for visit for repeat TSH.   Medication management . Pharmacist Clinical Goal(s): o Over the next 90 days, patient will work with PharmD and providers to achieve optimal medication adherence . Current pharmacy: [facility] . Interventions o Comprehensive medication review performed. o Continue current medication management strategy . Patient self care activities - Over the next 90 days, patient will: o Take medications as prescribed o Report any questions or concerns to PharmD and/or provider(s)  Initial goal documentation       SDOH Interventions  Most Recent Value  SDOH Interventions  Transportation Interventions  Intervention Not Indicated      COPD  Patient reports since being on these 3, has not been to hospital.   Last spirometry score: 02/08/2018 FVC: 28  FEV1: 29 FEV1/FVC: 103  Gold Grade: Gold 3 (FEV1 30-49%)  Eosinophil count:   Lab Results  Component Value Date/Time   EOSPCT 7 03/10/2019 11:47 AM  %                               Eos (Absolute):  Lab Results  Component Value Date/Time   EOSABS 0.5 03/10/2019 11:47 AM   EOSABS 0.8 (H) 06/23/2018 03:59 PM    Tobacco Status:  Social History   Tobacco Use  Smoking Status Former Smoker  . Packs/day: 1.50  . Years: 58.50  . Pack years: 87.75  . Types: Cigarettes  . Quit date: 03/03/2005  . Years since quitting: 15.0  Smokeless Tobacco Never Used     CAT ASSESSMENT  Rank each of the following items on a scale of 0 to 5  (with 5 being most severe) Write a # 0-5 in each box  I never cough (0) > I cough all the time (5) 2-3 Coughing spells  I have no phlegm (mucus) in my chest (0) > My chest is completely full of phlegm (mucus) (5) 2  My chest does not feel tight at all (0) > My chest feels very tight (5) 1- not on regular basis.  When I walk up a hill or one flight of stairs I am not breathless (0) > When I walk up a hill or one flight of stairs I am very breathless (5) 5 Walks a couple of steps and need to rest   I am not limited doing any activities at home (0) > I am very limited doing activities at home (5) 5  I am confident leaving my home despite my lung function (0) > I am not at all confident leaving my home because of my lung condition (5)  0  I sleep soundly (0) > I don't sleep soundly because of my lung condition (5) 1  I have lots of energy (0) > I have no energy at all (5) 5   Total CAT Score: 22  Patient has failed these meds in past: Trelegy   Patient is currently controlled on the following medications:   Albuterol (Ventolin HFA) 164mcg/ act inhaler, inhale 2 puffs every four hours as needed for shortness of breath  Montelukast 10mg , 1 tablet at bedtime   Symbicort 160/4.5 mcg/act inhaler, use 2 inhalations twice daily   Tiotropium (Spiriva Respimat) 2.5 mcg/ act, inhale 2 puffs every morning   Using maintenance inhaler regularly? Yes   Frequency of rescue inhaler use:  prn  - used a couple of times over last 2 months.  - 2x exacerbations over last year. - nebulizer is last resort if breathing is getting bad.  Patient reports having prednisone prescription at pharmacy and was instructed to use as needed for phelgm. Currently, no phlegm.  We discussed:  proper inhaler technique  Plan Managed by Dr. (pulmonologist). Follow up visit in July.  Continue current medications  Heart Failure   Type:  Diastolic  Last ejection fraction: 65% - 70% (08/26/2018)   Patient is currently controlled on the following medications:   Furosemide 20mg ,1 tablet every other day   Losartan 50mg ,1 tablet once  daily   We discussed weighing daily; if you gain more than 3 pounds in one day or 5 pounds in one week call your doctor  - patient mentions never a problem. She checks ankles/ legs.   Plan Continue current medications   Hypertension   Patient reported being overweight and has started a walking regimen. She aims to walk from house to street (about 30 feet).  BP today is:  <140/90  Office blood pressures are  BP Readings from Last 3 Encounters:  01/18/20 124/78  11/08/19 128/74  10/06/19 (!) 158/88   Patient has failed these meds in the past: amlodipine   Patient checks BP at home infrequently  Patient home BP readings are ranging: BP: 138/62 mmHg  Pulse: 88   Patient is controlled on:   Losartan 50mg ,1 tablet once daily   Diltiazem (Cardizem CD) 120mg , 1 capsule at bedtime   We discussed diet and exercise extensively  Plan Continue current medications   Hyperlipidemia   Lipid Panel     Component Value Date/Time   CHOL 164 01/18/2020 1110   CHOL 117 07/12/2015 1508   TRIG 117 01/18/2020 1110   TRIG 75 07/12/2015 1508   HDL 56 01/18/2020 1110   HDL 51 07/12/2015 1508   CHOLHDL 2.9 01/18/2020 1110   CHOLHDL 2.3 08/25/2018 2019   VLDL 22 08/25/2018 2019   LDLCALC 87 01/18/2020 1110   LDLCALC 51 07/12/2015 1508   LDLDIRECT 131.1 08/10/2012 0829   LABVLDL 21 01/18/2020 1110     The ASCVD Risk score (Goff DC Jr., et al., 2013) failed to calculate for the following reasons:   The patient has a prior MI or stroke diagnosis   Patient is currently controlled on the following medications:   Atorvastatin 40mg , 1 tablet once daily at 6 pm  Plan Continue current medications  CAD (s/p NSTEMI, CABGx3)   Patient is currently controlled on the following medications:    Aspirin 81mg ,1 tablet once daily (mid morning)   Atorvastatin 40mg , 1 tablet once daily   Plan Managed by cardiologist.  Continue current medications  Psoriatic arthritis    Patient is currently controlled on the following medications:   golimumab (Simponi Aria), inject 50mg  every eight weeks   leflunomide (Arava) 20mg , 1 tablet once daily    Plan Continue current medications  Pain   Patient is currently controlled on the following medications:   Tramadol 50mg , 1 tablet twice daily   APAP 650mg , 1,300mg  twice daily   Cyclobenzaprine 5mg , 1 tablet at bedtime as needed for muscle spasm  Plan Continue current medications  Depressive disorder   Patient inquired on wanting to discontinue but would prefer to discuss directly with Dr. Hernandez at follow up visit.   Patient is currently controlled on the following medications:   Citalopram 40mg , 1 tablet in the morning   Plan Continue current medications  Hypothyroidism   TSH  Date Value Ref Range Status  03/06/2020 93.66 (H) 0.35 - 4.50 uIU/mL Final    Patient is currently uncontrolled on the following medications:   Levothyroxine <MEASUREMENMarland KitchenT Marland Kitchen7 Marland Kitchen7 Marland [id number] once daily   We discussed:  consistent administration of levothyroxine (at least 30 minutes before breakfast on an empty stomach).   Plan Has follow up visit for repeat TSH.  Continue current medications  GERD/ Barrett's esophagus    Patient has failed these meds in past: omeprazole   Patient is currently controlled on the following medications:   Pantoprazole 40mg , 1 tablet once daily before breakfast  Plan Continue current medications  Osteopenia (query)    Last DEXA Scan: 08/17/2018  T-Score femoral neck: -1.9  T-Score lumbar spine: 0.8  T-Score femur: -1.7  10-year probability of major osteoporotic fracture: 10.3%  10-year probability of hip fracture: 1.9%  No results found for: VD25OH   Patient is not a candidate for pharmacologic  treatment  Patient is currently on the following medications:   Calcium, 1 tablet once daily   Vitamin D3 (cholecalciferl) (65mcg) 2000 units, 1 tablet once daily  We discussed:  Recommend [id number] units of vitamin D daily. Recommend 1200 mg of calcium daily from dietary and supplemental sources.  Plan Continue current medications   Psoriasis  Patient is currently controlled on the following medications:   Triamcinolone cream 1%, apply twice daily as needed for psoriasis   Plan Continue current medications  Medication Management  Patient organizes medications: uses pill box  (AM/PM)  Primary pharmacy: [facility]  Adherence: Patient confirmed last fill dates - atorvastatin 40mg  (last filled 12/12/19 for 90DS)  --- Patient reports having 3 weeks left. .  - leflunomide 20mg  (last filled 12/12/19 for 90DS)  ---patient reports having about 3 weeks.   - losartan 50mg  (last filled 12/27/19 for 90DS)  ---- patient reports having about 2 months.    Follow up Follow up visit with PharmD in September.    Anson Crofts, PharmD Clinical Pharmacist [facility] [phone]

## 2020-04-02 NOTE — Patient Instructions (Addendum)
Visit Information  Goals Addressed            This Visit's Progress   . Pharmacy Care Plan       CARE PLAN ENTRY  Current Barriers:  . Chronic Disease Management support, education, and care coordination needs related to Hypertension, Hyperlipidemia, Heart Failure, COPD, Hypothyroidism, and Depression   Hypertension . Pharmacist Clinical Goal(s): o Over the next 90 days, patient will work with PharmD and providers to maintain BP goal <130/80 . Current regimen:   Losartan 50mg ,1 tablet once daily   Diltiazem (Cardizem CD) 120mg , 1 capsule at bedtime  . Interventions: . Discussed diet modifications. DASH diet:  following a diet emphasizing fruits and vegetables and low-fat dairy products along with whole grains, fish, poultry, and nuts. Reducing red meats and sugars.  . Patient self care activities - Over the next 90 days, patient will: o Check BP as directed, document, and provide at future appointments o Ensure daily salt intake < 1500 mg/day  Hyperlipidemia . Pharmacist Clinical Goal(s): o Over the next 90 days, patient will work with PharmD and providers to maintain LDL goal < 70 . Current regimen:  o Atorvastatin 40mg , 1 tablet once daily at 6 pm . Patient self care activities - Over the next 90 days, patient will: o Continue current medications.   COPD . Pharmacist Clinical Goal(s) o Over the next 90 days, patient will work with PharmD and providers to Prevent worsening of shortness of breath and hospitalizations. . Current regimen:   Albuterol (Ventolin HFA) 134mcg/ act inhaler, inhale 2 puffs every four hours as needed for shortness of breath  Montelukast 10mg , 1 tablet at bedtime   Symbicort 160/4.5 mcg/act inhaler, use 2 inhalations twice daily   Tiotropium (Spiriva Respimat) 2.5 mcg/ act, inhale 2 puffs every morning  . Patient self care activities - Over the next 90 days, patient will: o Continue current medications and continue follow up visits with Dr.  (pulmonologist).   Depression . Pharmacist Clinical Goal(s) o Over the next 90 days, patient will work with PharmD and providers to Improve mood/ depression symptoms . Current regimen:   Citalopram 40mg , 1 tablet in the morning  . Interventions: o Discussed taper plan to discontinue citalopram.  . Patient self care activities o Patient will continue current medications and discuss further with Dr. .   Hypothyroidism . Pharmacist Clinical Goal(s) o Over the next 90 days, patient will work with PharmD and providers to maintain TSH: 0.35 to 4.50 uIU/mL . Current regimen:  o Levothyroxine 100m, 1 tablet once daily  . Interventions: o We discussed:  consistent administration of levothyroxine (at least 30 minutes before breakfast on an empty stomach).  . Patient self care activities o Patient will continue current medication and follow up for visit for repeat TSH.   Medication management . Pharmacist Clinical Goal(s): o Over the next 90 days, patient will work with PharmD and providers to achieve optimal medication adherence . Current pharmacy: [facility] . Interventions o Comprehensive medication review performed. o Continue current medication management strategy . Patient self care activities - Over the next 90 days, patient will: o Take medications as prescribed o Report any questions or concerns to PharmD and/or provider(s)  Initial goal documentation        [name] was given information about Chronic Care Management services today including:  1. CCM service includes personalized support from designated clinical staff supervised by her physician, including individualized plan of care and coordination with other care  providers 2. 24/7 contact phone numbers for assistance for urgent and routine care needs. 3. Standard insurance, coinsurance, copays and deductibles apply for chronic care management only during months in which we provide at least 20 minutes of  these services. Most insurances cover these services at 100%, however patients may be responsible for any copay, coinsurance and/or deductible if applicable. This service may help you avoid the need for more expensive face-to-face services. 4. Only one practitioner may furnish and bill the service in a calendar month. 5. The patient may stop CCM services at any time (effective at the end of the month) by phone call to the office staff.  Patient agreed to services and verbal consent obtained.   The patient verbalized understanding of instructions provided today and agreed to receive a mailed copy of patient instruction and/or educational materials. Telephone follow up appointment with pharmacy team member scheduled for: 07/24/2020  Anson Crofts, PharmD Clinical Pharmacist [facility] [place] [phone]   Eating Plan for Chronic Obstructive Pulmonary Disease Chronic obstructive pulmonary disease (COPD) causes symptoms such as shortness of breath, coughing, and chest discomfort. These symptoms can make it difficult to eat enough to maintain a healthy weight. Generally, people with COPD should eat a diet that is high in calories, protein, and other nutrients to maintain body weight and to keep the lungs as healthy as possible. Depending on the medicines you take and other health conditions you may have, your health care provider may give you additional recommendations on what to eat or avoid. Talk with your health care provider about your goals for body weight, and work with a dietitian to develop an eating plan that is right for you. What are tips for following this plan? Reading food labels   Avoid foods with more than 300 milligrams (mg) of salt (sodium) per serving.  Choose foods that contain at least 4 grams (g) of fiber per serving. Try to eat 20-30 g of fiber each day.  Choose foods that are high in calories and protein, such as nuts, beans, yogurt, and  cheese. Shopping  Do not buy foods labeled as diet, low-calorie, or low-fat.  If you are able to eat dairy products: ? Avoid low-fat or skim milk. ? Buy dairy products that have at least 2% fat.  Buy nutritional supplement drinks.  Buy grains and prepared foods labeled as enriched or fortified.  Consider buying low-sodium, pre-made foods to conserve energy for eating. Cooking  Add dry milk or protein powder to smoothies.  Cook with healthy fats, such as olive oil, canola oil, sunflower oil, and grapeseed oil.  Add oil, butter, cream cheese, or nut butters to foods to increase fat and calories.  To make foods easier to chew and swallow: ? Cook vegetables, pasta, and rice until soft. ? Cut or grind meat into very small pieces. ? Dip breads in liquid. Meal planning   Eat when you feel hungry.  Eat 5-6 small meals throughout the day.  Drink 6-8 glasses of water each day.  Do not drink liquids with meals. Drink liquids at the end of the meal to avoid feeling full too quickly.  Eat a variety of fruits and vegetables every day.  Ask for assistance from family or friends with planning and preparing meals as needed.  Avoid foods that cause you to feel bloated, such as carbonated drinks, fried foods, beans, broccoli, cabbage, and apples.  For older adults, ask your local agency on aging whether you are  eligible for meal assistance programs, such as Meals on Wheels. Lifestyle   Do not smoke.  Eat slowly. Take small bites and chew food well before swallowing.  Do not overeat. This may make it more difficult to breathe after eating.  Sit up while eating.  If needed, continue to use supplemental oxygen while eating.  Rest or relax for 30 minutes before and after eating.  Monitor your weight as told by your health care provider.  Exercise as told by your health care provider. What foods can I eat? Fruits All fresh, dried, canned, or frozen fruits that do not cause  gas. Vegetables All fresh, canned (no salt added), or frozen vegetables that do not cause gas. Grains Whole grain bread. Enriched whole grain pasta. Fortified whole grain cereals. Fortified rice. Quinoa. Meats and other proteins Lean meat. Poultry. Fish. Dried beans. Unsalted nuts. Tofu. Eggs. Nut butters. Dairy Whole or 2% milk. Cheese. Yogurt. Fats and oils Olive oil. Canola oil. Butter. Margarine. Beverages Water. Vegetable juice (no salt added). Decaffeinated coffee. Decaffeinated or herbal tea. Seasonings and condiments Fresh or dried herbs. Low-salt or salt-free seasonings. Low-sodium soy sauce. The items listed above may not be a complete list of foods and beverages you can eat. Contact a dietitian for more information. What foods are not recommended? Fruits Fruits that cause gas, such as apples or melon. Vegetables Vegetables that cause gas, such as broccoli, Brussels sprouts, cabbage, cauliflower, and onions. Canned vegetables with added salt. Meats and other proteins Fried meat. Salt-cured meat. Processed meat. Dairy Fat-free or low-fat milk, yogurt, or cheese. Processed cheese. Beverages Carbonated drinks. Caffeinated drinks, such as coffee, tea, and soft drinks. Juice. Alcohol. Vegetable juice with added salt. Seasonings and condiments Salt. Seasoning mixes with salt. Soy sauce. Rosita Fire. Other foods Clear soup or broth. Fried foods. Prepared frozen meals. The items listed above may not be a complete list of foods and beverages you should avoid. Contact a dietitian for more information. Summary  COPD symptoms can make it difficult to eat enough to maintain a healthy weight.  A COPD eating plan can help you maintain your body weight and keep your lungs as healthy as possible.  Eat a diet that is high in calories, protein, and other nutrients. Read labels to make sure that you are getting the right nutrients. Cook foods to make them easy to chew and swallow.  Eat 5-6  small meals throughout the day, and avoid foods that cause gas or make you feel bloated. This information is not intended to replace advice given to you by your health care provider. Make sure you discuss any questions you have with your health care provider. Document Revised: 02/10/2019 Document Reviewed: 01/05/2018 Elsevier Patient Education  2020 [employer].

## 2020-04-03 DIAGNOSIS — Z79899 Other long term (current) drug therapy: Secondary | ICD-10-CM | POA: Diagnosis not present

## 2020-04-03 DIAGNOSIS — M0589 Other rheumatoid arthritis with rheumatoid factor of multiple sites: Secondary | ICD-10-CM | POA: Diagnosis not present

## 2020-04-20 ENCOUNTER — Other Ambulatory Visit: Payer: Self-pay

## 2020-04-23 ENCOUNTER — Other Ambulatory Visit: Payer: Self-pay

## 2020-04-23 ENCOUNTER — Other Ambulatory Visit (INDEPENDENT_AMBULATORY_CARE_PROVIDER_SITE_OTHER): Payer: Medicare Other

## 2020-04-23 DIAGNOSIS — E039 Hypothyroidism, unspecified: Secondary | ICD-10-CM | POA: Diagnosis not present

## 2020-04-24 LAB — TSH: TSH: 0.61 u[IU]/mL (ref 0.35–4.50)

## 2020-04-25 ENCOUNTER — Encounter: Payer: Self-pay | Admitting: Internal Medicine

## 2020-05-01 ENCOUNTER — Other Ambulatory Visit: Payer: Self-pay | Admitting: Internal Medicine

## 2020-05-01 DIAGNOSIS — M5441 Lumbago with sciatica, right side: Secondary | ICD-10-CM

## 2020-05-08 ENCOUNTER — Ambulatory Visit: Payer: Medicare Other | Admitting: Internal Medicine

## 2020-05-21 ENCOUNTER — Other Ambulatory Visit: Payer: Self-pay | Admitting: Internal Medicine

## 2020-05-29 ENCOUNTER — Other Ambulatory Visit: Payer: Self-pay | Admitting: Cardiology

## 2020-06-04 ENCOUNTER — Telehealth: Payer: Self-pay | Admitting: Internal Medicine

## 2020-06-04 MED ORDER — ALBUTEROL SULFATE HFA 108 (90 BASE) MCG/ACT IN AERS: 2.0000 | INHALATION_SPRAY | RESPIRATORY_TRACT | 5 refills | Status: DC | PRN

## 2020-06-04 NOTE — Telephone Encounter (Signed)
Rx for albuterol inhaler has been sent to pharmacy for pt. Attempted to call pt to let her know this had been done but line went straight to VM. Left pt a detailed message letting her know that the med had been refilled. Nothing further needed.

## 2020-06-05 ENCOUNTER — Encounter (HOSPITAL_COMMUNITY): Payer: Self-pay

## 2020-06-05 ENCOUNTER — Emergency Department (HOSPITAL_COMMUNITY)
Admission: EM | Admit: 2020-06-05 | Discharge: 2020-06-06 | Disposition: A | Discharge status: Home / Self Care | Payer: Medicare Other | Attending: Emergency Medicine | Admitting: Emergency Medicine

## 2020-06-05 ENCOUNTER — Telehealth: Payer: Self-pay | Admitting: Internal Medicine

## 2020-06-05 ENCOUNTER — Emergency Department (HOSPITAL_COMMUNITY): Payer: Medicare Other

## 2020-06-05 DIAGNOSIS — J9811 Atelectasis: Secondary | ICD-10-CM | POA: Diagnosis not present

## 2020-06-05 DIAGNOSIS — R0602 Shortness of breath: Secondary | ICD-10-CM | POA: Diagnosis not present

## 2020-06-05 DIAGNOSIS — J449 Chronic obstructive pulmonary disease, unspecified: Secondary | ICD-10-CM | POA: Diagnosis not present

## 2020-06-05 DIAGNOSIS — I509 Heart failure, unspecified: Secondary | ICD-10-CM | POA: Diagnosis not present

## 2020-06-05 DIAGNOSIS — Z79899 Other long term (current) drug therapy: Secondary | ICD-10-CM | POA: Diagnosis not present

## 2020-06-05 DIAGNOSIS — Z5321 Procedure and treatment not carried out due to patient leaving prior to being seen by health care provider: Secondary | ICD-10-CM | POA: Diagnosis not present

## 2020-06-05 DIAGNOSIS — I517 Cardiomegaly: Secondary | ICD-10-CM | POA: Diagnosis not present

## 2020-06-05 DIAGNOSIS — M0589 Other rheumatoid arthritis with rheumatoid factor of multiple sites: Secondary | ICD-10-CM | POA: Diagnosis not present

## 2020-06-05 LAB — CBC
HCT: 32.5 % — ABNORMAL LOW (ref 36.0–46.0)
Hemoglobin: 9.4 g/dL — ABNORMAL LOW (ref 12.0–15.0)
MCH: 28.2 pg (ref 26.0–34.0)
MCHC: 28.9 g/dL — ABNORMAL LOW (ref 30.0–36.0)
MCV: 97.6 fL (ref 80.0–100.0)
Platelets: 201 10*3/uL (ref 150–400)
RBC: 3.33 MIL/uL — ABNORMAL LOW (ref 3.87–5.11)
RDW: 14.1 % (ref 11.5–15.5)
WBC: 8.2 10*3/uL (ref 4.0–10.5)
nRBC: 0 % (ref 0.0–0.2)

## 2020-06-05 LAB — BASIC METABOLIC PANEL
Anion gap: 12 (ref 5–15)
BUN: 28 mg/dL — ABNORMAL HIGH (ref 8–23)
CO2: 33 mmol/L — ABNORMAL HIGH (ref 22–32)
Calcium: 9.3 mg/dL (ref 8.9–10.3)
Chloride: 93 mmol/L — ABNORMAL LOW (ref 98–111)
Creatinine, Ser: 1.16 mg/dL — ABNORMAL HIGH (ref 0.44–1.00)
GFR calc Af Amer: 54 mL/min — ABNORMAL LOW (ref >60)
GFR calc non Af Amer: 47 mL/min — ABNORMAL LOW (ref >60)
Glucose, Bld: 166 mg/dL — ABNORMAL HIGH (ref 70–99)
Potassium: 5 mmol/L (ref 3.5–5.1)
Sodium: 138 mmol/L (ref 135–145)

## 2020-06-05 LAB — TROPONIN I (HIGH SENSITIVITY)
Troponin I (High Sensitivity): 23 ng/L — ABNORMAL HIGH (ref <18)
Troponin I (High Sensitivity): 23 ng/L — ABNORMAL HIGH (ref <18)

## 2020-06-05 MED ORDER — SODIUM CHLORIDE 0.9% FLUSH: 3.0000 mL | Freq: Once | INTRAVENOUS | Status: DC

## 2020-06-05 NOTE — Telephone Encounter (Signed)
Spoke with patient and she will go to the ED.

## 2020-06-05 NOTE — Telephone Encounter (Signed)
Pt would like to know where her PCP thinks is the best place to get a COVID test?   Pt can be reached at [phone]

## 2020-06-05 NOTE — ED Notes (Signed)
Pt stated she was leaving and would follow up with PCP in the morning

## 2020-06-05 NOTE — Telephone Encounter (Signed)
I believe she should have ED evaluation if she is SOB.

## 2020-06-05 NOTE — ED Triage Notes (Signed)
Pt arrives to ED w/ c/o SOB x 3 days worse w/ exertion. Pt has hx of CHF and COPD. Pt wears 2 lpm o2 at baseline. Pt denies chest pain.

## 2020-06-05 NOTE — Telephone Encounter (Signed)
[name] (on [place]) stated her mother has been very SOB with no fever or loss of taste or smell. She states she gets pneumonia very easily and is afraid that this is what that is. She asked to be seen by Ardyth Harps today and to have an xray done. Informed her that Dr. Ardyth Harps is booked today and with no x-ray tech she will need to go to the nearest ER or Urgent care. She stated she will take her to the [facility]. Sending back as [employer]

## 2020-06-06 ENCOUNTER — Telehealth: Payer: Self-pay | Admitting: Internal Medicine

## 2020-06-06 ENCOUNTER — Emergency Department (HOSPITAL_COMMUNITY): Payer: Medicare Other

## 2020-06-06 ENCOUNTER — Observation Stay (HOSPITAL_COMMUNITY)
Admission: EM | Admit: 2020-06-06 | Discharge: 2020-06-08 | Disposition: A | Discharge status: Home / Self Care | Payer: Medicare Other | Attending: Internal Medicine | Admitting: Internal Medicine

## 2020-06-06 DIAGNOSIS — Z87891 Personal history of nicotine dependence: Secondary | ICD-10-CM | POA: Insufficient documentation

## 2020-06-06 DIAGNOSIS — Z7982 Long term (current) use of aspirin: Secondary | ICD-10-CM | POA: Diagnosis not present

## 2020-06-06 DIAGNOSIS — I4891 Unspecified atrial fibrillation: Secondary | ICD-10-CM | POA: Diagnosis not present

## 2020-06-06 DIAGNOSIS — I5033 Acute on chronic diastolic (congestive) heart failure: Secondary | ICD-10-CM | POA: Diagnosis not present

## 2020-06-06 DIAGNOSIS — I10 Essential (primary) hypertension: Secondary | ICD-10-CM | POA: Diagnosis present

## 2020-06-06 DIAGNOSIS — J441 Chronic obstructive pulmonary disease with (acute) exacerbation: Principal | ICD-10-CM | POA: Diagnosis present

## 2020-06-06 DIAGNOSIS — I509 Heart failure, unspecified: Secondary | ICD-10-CM | POA: Diagnosis not present

## 2020-06-06 DIAGNOSIS — Z9981 Dependence on supplemental oxygen: Secondary | ICD-10-CM | POA: Insufficient documentation

## 2020-06-06 DIAGNOSIS — N1831 Chronic kidney disease, stage 3a: Secondary | ICD-10-CM | POA: Diagnosis not present

## 2020-06-06 DIAGNOSIS — J9620 Acute and chronic respiratory failure, unspecified whether with hypoxia or hypercapnia: Secondary | ICD-10-CM | POA: Diagnosis not present

## 2020-06-06 DIAGNOSIS — R0602 Shortness of breath: Secondary | ICD-10-CM | POA: Diagnosis not present

## 2020-06-06 DIAGNOSIS — I2510 Atherosclerotic heart disease of native coronary artery without angina pectoris: Secondary | ICD-10-CM | POA: Diagnosis not present

## 2020-06-06 DIAGNOSIS — Z79899 Other long term (current) drug therapy: Secondary | ICD-10-CM | POA: Insufficient documentation

## 2020-06-06 DIAGNOSIS — E785 Hyperlipidemia, unspecified: Secondary | ICD-10-CM | POA: Diagnosis present

## 2020-06-06 DIAGNOSIS — Z20822 Contact with and (suspected) exposure to covid-19: Secondary | ICD-10-CM | POA: Insufficient documentation

## 2020-06-06 DIAGNOSIS — J9621 Acute and chronic respiratory failure with hypoxia: Secondary | ICD-10-CM | POA: Diagnosis present

## 2020-06-06 DIAGNOSIS — E039 Hypothyroidism, unspecified: Secondary | ICD-10-CM | POA: Diagnosis present

## 2020-06-06 DIAGNOSIS — I130 Hypertensive heart and chronic kidney disease with heart failure and stage 1 through stage 4 chronic kidney disease, or unspecified chronic kidney disease: Secondary | ICD-10-CM | POA: Insufficient documentation

## 2020-06-06 DIAGNOSIS — R778 Other specified abnormalities of plasma proteins: Secondary | ICD-10-CM | POA: Diagnosis present

## 2020-06-06 DIAGNOSIS — J189 Pneumonia, unspecified organism: Secondary | ICD-10-CM | POA: Diagnosis not present

## 2020-06-06 DIAGNOSIS — R0789 Other chest pain: Secondary | ICD-10-CM | POA: Diagnosis not present

## 2020-06-06 DIAGNOSIS — R7989 Other specified abnormal findings of blood chemistry: Secondary | ICD-10-CM | POA: Diagnosis present

## 2020-06-06 DIAGNOSIS — R069 Unspecified abnormalities of breathing: Secondary | ICD-10-CM | POA: Diagnosis not present

## 2020-06-06 DIAGNOSIS — R0902 Hypoxemia: Secondary | ICD-10-CM

## 2020-06-06 LAB — BASIC METABOLIC PANEL
Anion gap: 7 (ref 5–15)
BUN: 23 mg/dL (ref 8–23)
CO2: 34 mmol/L — ABNORMAL HIGH (ref 22–32)
Calcium: 9.3 mg/dL (ref 8.9–10.3)
Chloride: 98 mmol/L (ref 98–111)
Creatinine, Ser: 1.04 mg/dL — ABNORMAL HIGH (ref 0.44–1.00)
GFR calc Af Amer: >60 mL/min (ref >60)
GFR calc non Af Amer: 53 mL/min — ABNORMAL LOW (ref >60)
Glucose, Bld: 136 mg/dL — ABNORMAL HIGH (ref 70–99)
Potassium: 5.1 mmol/L (ref 3.5–5.1)
Sodium: 139 mmol/L (ref 135–145)

## 2020-06-06 LAB — CBC
HCT: 34.9 % — ABNORMAL LOW (ref 36.0–46.0)
Hemoglobin: 10 g/dL — ABNORMAL LOW (ref 12.0–15.0)
MCH: 27.9 pg (ref 26.0–34.0)
MCHC: 28.7 g/dL — ABNORMAL LOW (ref 30.0–36.0)
MCV: 97.2 fL (ref 80.0–100.0)
Platelets: 217 10*3/uL (ref 150–400)
RBC: 3.59 MIL/uL — ABNORMAL LOW (ref 3.87–5.11)
RDW: 14.1 % (ref 11.5–15.5)
WBC: 8.2 10*3/uL (ref 4.0–10.5)
nRBC: 0 % (ref 0.0–0.2)

## 2020-06-06 NOTE — ED Triage Notes (Signed)
Pt here from home for eval of ongoing shob. Seen yesterday for same, discharged, and her PCP called today and advised to come back to ER for possible PNA. Wears 2L O2 at baseline, improved shob with 4L.

## 2020-06-06 NOTE — Telephone Encounter (Signed)
Spoke with patient and she verbally agreed. Will observe for patient's arrival at the ED

## 2020-06-06 NOTE — Telephone Encounter (Signed)
[name] called again and wants her PCP's opinion on the x-ray, blood work, and vitals they did at the ED before they left whenever the PCP calls her back

## 2020-06-06 NOTE — Telephone Encounter (Signed)
She has findings suggestive of PNA on CXR and elevated heart enzymes as well as significant anemia on labs. ED evaluation remains my recommendation.Marland KitchenMarland Kitchen

## 2020-06-06 NOTE — Telephone Encounter (Signed)
Pt daughter Selena Batten call and stated she went to the ER with her mother and was their for seven hours and they left before they saw her. She want a call back at [phone].

## 2020-06-06 NOTE — Telephone Encounter (Signed)
[name] would like a call back from [place] at [phone]   She stated she called EMS they are telling her it will be a 9-10 hr wait and asking what Dr. Ardyth Harps is recommending because she does not want to sit in the ED room for that long and they not admit her mom

## 2020-06-07 ENCOUNTER — Encounter (HOSPITAL_COMMUNITY): Payer: Self-pay | Admitting: Internal Medicine

## 2020-06-07 ENCOUNTER — Other Ambulatory Visit: Payer: Self-pay

## 2020-06-07 DIAGNOSIS — R778 Other specified abnormalities of plasma proteins: Secondary | ICD-10-CM | POA: Diagnosis present

## 2020-06-07 DIAGNOSIS — N1831 Chronic kidney disease, stage 3a: Secondary | ICD-10-CM | POA: Diagnosis present

## 2020-06-07 DIAGNOSIS — J441 Chronic obstructive pulmonary disease with (acute) exacerbation: Secondary | ICD-10-CM | POA: Diagnosis not present

## 2020-06-07 LAB — BRAIN NATRIURETIC PEPTIDE: B Natriuretic Peptide: 152.2 pg/mL — ABNORMAL HIGH (ref 0.0–100.0)

## 2020-06-07 LAB — TROPONIN I (HIGH SENSITIVITY): Troponin I (High Sensitivity): 23 ng/L — ABNORMAL HIGH (ref <18)

## 2020-06-07 LAB — SARS CORONAVIRUS 2 BY RT PCR (HOSPITAL ORDER, PERFORMED IN ~~LOC~~ HOSPITAL LAB): SARS Coronavirus 2: NEGATIVE

## 2020-06-07 LAB — HIV ANTIBODY (ROUTINE TESTING W REFLEX): HIV Screen 4th Generation wRfx: NONREACTIVE

## 2020-06-07 MED ORDER — PANTOPRAZOLE SODIUM 40 MG PO TBEC
40.0000 mg | DELAYED_RELEASE_TABLET | Freq: Every day | ORAL | Status: DC
  Administered 2020-06-07 – 2020-06-08 (×2): 40 mg via ORAL
  Filled 2020-06-07 (×2): qty 1

## 2020-06-07 MED ORDER — ACETAMINOPHEN 325 MG PO TABS
650.0000 mg | ORAL_TABLET | Freq: Two times a day (BID) | ORAL | Status: DC
  Administered 2020-06-07 – 2020-06-08 (×3): 650 mg via ORAL
  Filled 2020-06-07 (×3): qty 2

## 2020-06-07 MED ORDER — PREDNISONE 20 MG PO TABS
60.0000 mg | ORAL_TABLET | Freq: Once | ORAL | Status: AC
  Administered 2020-06-07: 60 mg via ORAL
  Filled 2020-06-07: qty 3

## 2020-06-07 MED ORDER — MONTELUKAST SODIUM 10 MG PO TABS
10.0000 mg | ORAL_TABLET | Freq: Every day | ORAL | Status: DC
  Administered 2020-06-07: 10 mg via ORAL
  Filled 2020-06-07: qty 1

## 2020-06-07 MED ORDER — ACETAMINOPHEN 650 MG RE SUPP: 650.0000 mg | Freq: Four times a day (QID) | RECTAL | Status: DC | PRN

## 2020-06-07 MED ORDER — PREDNISONE 20 MG PO TABS
40.0000 mg | ORAL_TABLET | Freq: Every day | ORAL | Status: DC
  Administered 2020-06-08: 40 mg via ORAL
  Filled 2020-06-07: qty 2

## 2020-06-07 MED ORDER — ATORVASTATIN CALCIUM 40 MG PO TABS
40.0000 mg | ORAL_TABLET | Freq: Every evening | ORAL | Status: DC
  Administered 2020-06-07: 40 mg via ORAL
  Filled 2020-06-07: qty 1

## 2020-06-07 MED ORDER — LACTATED RINGERS IV SOLN: INTRAVENOUS | Status: DC

## 2020-06-07 MED ORDER — SODIUM CHLORIDE 0.9% FLUSH
3.0000 mL | Freq: Two times a day (BID) | INTRAVENOUS | Status: DC
  Administered 2020-06-07 – 2020-06-08 (×2): 3 mL via INTRAVENOUS

## 2020-06-07 MED ORDER — IPRATROPIUM-ALBUTEROL 0.5-2.5 (3) MG/3ML IN SOLN: 3.0000 mL | RESPIRATORY_TRACT | Status: DC

## 2020-06-07 MED ORDER — MORPHINE SULFATE (PF) 2 MG/ML IV SOLN: 2.0000 mg | INTRAVENOUS | Status: DC | PRN

## 2020-06-07 MED ORDER — DOXYCYCLINE HYCLATE 100 MG PO TABS
100.0000 mg | ORAL_TABLET | Freq: Two times a day (BID) | ORAL | Status: DC
  Administered 2020-06-07 – 2020-06-08 (×2): 100 mg via ORAL
  Filled 2020-06-07 (×3): qty 1

## 2020-06-07 MED ORDER — LOSARTAN POTASSIUM 50 MG PO TABS
50.0000 mg | ORAL_TABLET | Freq: Every day | ORAL | Status: DC
  Administered 2020-06-07 – 2020-06-08 (×2): 50 mg via ORAL
  Filled 2020-06-07 (×2): qty 1

## 2020-06-07 MED ORDER — CITALOPRAM HYDROBROMIDE 20 MG PO TABS
40.0000 mg | ORAL_TABLET | Freq: Every day | ORAL | Status: DC
  Administered 2020-06-07 – 2020-06-08 (×2): 40 mg via ORAL
  Filled 2020-06-07: qty 2
  Filled 2020-06-07: qty 4

## 2020-06-07 MED ORDER — MOMETASONE FURO-FORMOTEROL FUM 200-5 MCG/ACT IN AERO
2.0000 | INHALATION_SPRAY | Freq: Two times a day (BID) | RESPIRATORY_TRACT | Status: DC
  Administered 2020-06-07 – 2020-06-08 (×2): 2 via RESPIRATORY_TRACT
  Filled 2020-06-07: qty 8.8

## 2020-06-07 MED ORDER — IPRATROPIUM-ALBUTEROL 0.5-2.5 (3) MG/3ML IN SOLN
3.0000 mL | RESPIRATORY_TRACT | Status: AC
  Administered 2020-06-07 (×3): 3 mL via RESPIRATORY_TRACT
  Filled 2020-06-07: qty 9
  Filled 2020-06-07: qty 6

## 2020-06-07 MED ORDER — DOCUSATE SODIUM 100 MG PO CAPS
100.0000 mg | ORAL_CAPSULE | Freq: Two times a day (BID) | ORAL | Status: DC
  Filled 2020-06-07 (×3): qty 1

## 2020-06-07 MED ORDER — CYCLOBENZAPRINE HCL 5 MG PO TABS: 5.0000 mg | ORAL_TABLET | Freq: Every evening | ORAL | Status: DC | PRN

## 2020-06-07 MED ORDER — ALBUTEROL SULFATE (2.5 MG/3ML) 0.083% IN NEBU
2.5000 mg | INHALATION_SOLUTION | RESPIRATORY_TRACT | Status: DC
  Administered 2020-06-07: 2.5 mg via RESPIRATORY_TRACT
  Filled 2020-06-07: qty 3

## 2020-06-07 MED ORDER — UMECLIDINIUM BROMIDE 62.5 MCG/INH IN AEPB
1.0000 | INHALATION_SPRAY | Freq: Every day | RESPIRATORY_TRACT | Status: DC
  Filled 2020-06-07: qty 7

## 2020-06-07 MED ORDER — DILTIAZEM HCL ER COATED BEADS 120 MG PO CP24
120.0000 mg | ORAL_CAPSULE | Freq: Once | ORAL | Status: AC
  Administered 2020-06-07: 120 mg via ORAL
  Filled 2020-06-07: qty 1

## 2020-06-07 MED ORDER — TRAMADOL HCL 50 MG PO TABS
50.0000 mg | ORAL_TABLET | Freq: Two times a day (BID) | ORAL | Status: DC
  Administered 2020-06-07 – 2020-06-08 (×3): 50 mg via ORAL
  Filled 2020-06-07 (×3): qty 1

## 2020-06-07 MED ORDER — ONDANSETRON HCL 4 MG PO TABS: 4.0000 mg | ORAL_TABLET | Freq: Four times a day (QID) | ORAL | Status: DC | PRN

## 2020-06-07 MED ORDER — ACETAMINOPHEN 325 MG PO TABS: 650.0000 mg | ORAL_TABLET | Freq: Four times a day (QID) | ORAL | Status: DC | PRN

## 2020-06-07 MED ORDER — ENOXAPARIN SODIUM 40 MG/0.4ML ~~LOC~~ SOLN
40.0000 mg | SUBCUTANEOUS | Status: DC
  Administered 2020-06-07 – 2020-06-08 (×2): 40 mg via SUBCUTANEOUS
  Filled 2020-06-07 (×2): qty 0.4

## 2020-06-07 MED ORDER — ALBUTEROL SULFATE (2.5 MG/3ML) 0.083% IN NEBU: 2.5000 mg | INHALATION_SOLUTION | RESPIRATORY_TRACT | Status: DC | PRN

## 2020-06-07 MED ORDER — HYDROCODONE-ACETAMINOPHEN 5-325 MG PO TABS: 1.0000 | ORAL_TABLET | ORAL | Status: DC | PRN

## 2020-06-07 MED ORDER — LOSARTAN POTASSIUM 50 MG PO TABS
50.0000 mg | ORAL_TABLET | Freq: Once | ORAL | Status: AC
  Administered 2020-06-07: 50 mg via ORAL
  Filled 2020-06-07: qty 1

## 2020-06-07 MED ORDER — METHYLPREDNISOLONE SODIUM SUCC 125 MG IJ SOLR
60.0000 mg | Freq: Two times a day (BID) | INTRAMUSCULAR | Status: AC
  Administered 2020-06-07 (×2): 60 mg via INTRAVENOUS
  Filled 2020-06-07 (×2): qty 2

## 2020-06-07 MED ORDER — LEFLUNOMIDE 20 MG PO TABS
20.0000 mg | ORAL_TABLET | Freq: Every day | ORAL | Status: DC
  Administered 2020-06-07 – 2020-06-08 (×2): 20 mg via ORAL
  Filled 2020-06-07 (×3): qty 1

## 2020-06-07 MED ORDER — ONDANSETRON HCL 4 MG/2ML IJ SOLN: 4.0000 mg | Freq: Four times a day (QID) | INTRAMUSCULAR | Status: DC | PRN

## 2020-06-07 MED ORDER — DILTIAZEM HCL 25 MG/5ML IV SOLN
10.0000 mg | Freq: Once | INTRAVENOUS | Status: AC
  Administered 2020-06-07: 10 mg via INTRAVENOUS
  Filled 2020-06-07: qty 5

## 2020-06-07 MED ORDER — ASPIRIN 81 MG PO CHEW
81.0000 mg | CHEWABLE_TABLET | Freq: Every day | ORAL | Status: DC
  Administered 2020-06-07 – 2020-06-08 (×2): 81 mg via ORAL
  Filled 2020-06-07 (×2): qty 1

## 2020-06-07 MED ORDER — LEVOTHYROXINE SODIUM 25 MCG PO TABS
125.0000 ug | ORAL_TABLET | Freq: Every day | ORAL | Status: DC
  Administered 2020-06-07 – 2020-06-08 (×2): 125 ug via ORAL
  Filled 2020-06-07 (×2): qty 1

## 2020-06-07 MED ORDER — BISACODYL 5 MG PO TBEC: 5.0000 mg | DELAYED_RELEASE_TABLET | Freq: Every day | ORAL | Status: DC | PRN

## 2020-06-07 MED ORDER — DOXYCYCLINE HYCLATE 100 MG PO TABS
100.0000 mg | ORAL_TABLET | Freq: Once | ORAL | Status: AC
  Administered 2020-06-07: 100 mg via ORAL
  Filled 2020-06-07: qty 1

## 2020-06-07 MED ORDER — DILTIAZEM HCL ER COATED BEADS 120 MG PO CP24
120.0000 mg | ORAL_CAPSULE | Freq: Every day | ORAL | Status: DC
  Administered 2020-06-07: 120 mg via ORAL
  Filled 2020-06-07: qty 1

## 2020-06-07 MED ORDER — HYDRALAZINE HCL 20 MG/ML IJ SOLN: 5.0000 mg | INTRAMUSCULAR | Status: DC | PRN

## 2020-06-07 MED ORDER — ZOLPIDEM TARTRATE 5 MG PO TABS
5.0000 mg | ORAL_TABLET | Freq: Every evening | ORAL | Status: DC | PRN
  Filled 2020-06-07: qty 1

## 2020-06-07 MED ORDER — POLYETHYLENE GLYCOL 3350 17 G PO PACK: 17.0000 g | PACK | Freq: Every day | ORAL | Status: DC | PRN

## 2020-06-07 NOTE — H&P (Signed)
History and Physical    [name] ZOX:[id number] DOB: 1946/04/05 DOA: 06/06/2020  PCP: [name], [name], MD Consultants:  Sherene Sires - pulmonology; [facility] - rheumatology; Delton See - cardiology Patient coming from:  Home - lives with daughter; Jackey Loge: Daughter, [phone]  Chief Complaint: SOB  HPI: [name] is a 74 y.o. female with medical history significant of CVA; CAD s/p CABG; pulmonary fibrosis; psoriatic arthritis; morbid obesity; hypothyroidism; HTN; HLD; afib; COPD on home O2; and chronic diastolic CHF presenting with SOB.  She reports labored breathing, SOB.  She was having difficulty walking - fine with sitting but too exhausted to get up.  She felt "a little bit of foggy feeling."  She came to the ER Monday night (her doctor sent her) and was too exhausted to stay.  Her doctor called her back and sent her back to the ER.  Increased phlegm from usual, change in color.  She has been hospitalized for COPD exacerbations in the past.  She wears 2L at home, 3L with exertion.  No fever.  Mild chest discomfort in the beginning, and she thinks that was related to overexerting herself.    ED Course:  COPD exacerbation.  On chronic O2.  Worsening SOB, cough x 2 days.  Came to ER - +troponin, had CXR, left without being seen.  With abnormal labs, PCP called and told her to come back.  4 nebs, on 3L with improvement but persistent tachypnea even at rest.  On 20 mg prednisone for several days.    Review of Systems: As per HPI; otherwise review of systems reviewed and negative.   Ambulatory Status:  Ambulates without assistance, uses a cane or walker outside of the home  COVID Vaccine Status:   Complete  Past Medical History:  Diagnosis Date  . Acute bronchitis 09/25/2015  . ACUTE ON CHRONIC DIASTOLIC HEART FAILURE 12/04/2010   Qualifier: Diagnosis of  By: Ladona Ridgel, MD, [facility], Vergia Alcon   . Arthritis    OA RIGHT KNEE WITH PAIN  . Barrett esophagus   . Bradycardia  06/01/2015  . CHF (congestive heart failure) (HCC)   . Chronic respiratory failure (HCC)   . Chronic respiratory failure with hypoxia and hypercapnia (HCC) 02/04/2010   Followed in Pulmonary clinic/ [facility]/ [facility]       - 02 dependent  since 07/02/10 >>  83% RA December 05, 2010       - ONO RA 08/05/12  :  Positive sat < 89 x 2:35m> repeat on 2lpm rec 08/12/2012  - 06/17/2013 reported desat with activity p Knee surgery > rec restart 2lpm with activity  - 06/27/2013   Walked 2lpm  x one lap @ 185 stopped due to sat 88% not sob , desat to 82% on RA just at th  . COPD III spirometry if use FEV1/VC p saba  07/18/2010   Quit smoking May 2006       - PFT's  04/12/10 FEV1  1.21 (69%) ratio 77 and no change p B2,  DLC0 56%   VC 70%         - PFTs  08/08/2013 FEV1 1.21 (60%) ratio 86 and no change p B2 DLCO 79%  VC 72%  On symbicort 160 2bid  - PFT's  02/08/2018  FEV1 0.70 (40 % ) ratio 56 if use FEV1/VC  p 38 % improvement from saba p symb 160 prior to study with DLCO  78 % corrects to 147  % for alv volume   -  02/08/2018  . Cough variant asthma 02/26/2011   Followed in Pulmonary clinic/ [facility]/ [facility]  - PFT's  06/04/15  FEV1 1.20 (67 % ) ratio 83  p 6 % improvement from saba with DLCO  80 % corrects to 132 % for alv volume      - Clinical dx based on response to symbicort       FENO 09/16/2016  =   96 on symbicort 160 2bid > added singulair  Allergy profile 09/16/2016 >  Eos 0.5 /  IgE  78 neg RAST  -  Referred to rehab 04/29/2017 > completed  . Essential hypertension 04/20/2007   Qualifier: Diagnosis of  By: Marcelyn Ditty RN, Katy Fitch   . GERD (gastroesophageal reflux disease)   . History of ARDS 2006  . History of home oxygen therapy    AT NIGHT WHEN SLEEPING 2 L / MIN NASAL CANNULA  . Hyperlipidemia 07/12/2015  . Hypothyroidism   . Morbid (severe) obesity due to excess calories (HCC) 04/22/2015   pfts with erv 14% 06/04/15  And 33% 02/08/2018   . NSTEMI (non-ST elevated myocardial infarction) (HCC) 05/31/2015  .  Pneumococcal pneumonia (HCC) 2006   HOSPITALIZED AND DEVELOPED ARDS  . Psoriatic arthritis (HCC)   . PULMONARY FIBROSIS ILD POST INFLAMMATORY CHRONIC 07/18/2010   Followed as Primary Care Patient/ [facility]/ [facility]  -s/p ARDS 2006 with bacteremic S  Pna       - CT chest 07/03/10 Nonspecific PF mostly upper lobes       - CT chest 12/03/10 acute gg changes and effusions c/w chf - PFT's  02/08/2018  FVC 0.64 (28 %)   with DLCO  78 % corrects to 147 % for alv volume     . Rheumatic disease   . 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 SOB (shortness of breath) on exertion   . Stroke [facility])     Past Surgical History:  Procedure Laterality Date  . ABDOMINOPLASTY    . CARDIAC CATHETERIZATION N/A 06/01/2015   Procedure: Left Heart Cath and Coronary Angiography;  Surgeon: Runell Gess, MD;  Location: [facility] INVASIVE CV LAB;  Service: Cardiovascular;  Laterality: N/A;  . CARPAL TUNNEL RELEASE    . CHOLECYSTECTOMY    . CORONARY ARTERY BYPASS GRAFT N/A 06/04/2015   Procedure: CORONARY ARTERY BYPASS GRAFT times three            with left internal mammary artery and right leg saphenous vein;  Surgeon: Alleen Borne, MD;  Location: [facility] OR;  Service: Open Heart Surgery;  Laterality: N/A;  . cosmetic breast surgery    . JOINT REPLACEMENT    . KNEE ARTHROSCOPY Left   . TEE WITHOUT CARDIOVERSION  06/04/2015   Procedure: TRANSESOPHAGEAL ECHOCARDIOGRAM (TEE);  Surgeon: Alleen Borne, MD;  Location: [facility] OR;  Service: Open Heart Surgery;;  . TOTAL KNEE ARTHROPLASTY Left   . TOTAL KNEE ARTHROPLASTY Right 06/06/2013   Procedure: RIGHT TOTAL KNEE ARTHROPLASTY;  Surgeon: Loanne Drilling, MD;  Location: [facility] ORS;  Service: Orthopedics;  Laterality: Right;    Social History   Socioeconomic History  . Marital status: Widowed    Spouse name: Not on file  . Number of children: Not on file  . Years of education: Not on file  . Highest education level: Not on file  Occupational History  . Occupation: retired    Associate Professor: Designer, jewellery   Tobacco Use  . Smoking status: Former Smoker    Packs/day: 1.50  Years: 58.50    Pack years: 87.75    Types: Cigarettes    Quit date: 03/03/2005    Years since quitting: 15.2  . Smokeless tobacco: Never Used  Vaping Use  . Vaping Use: Never used  Substance and Sexual Activity  . Alcohol use: No  . Drug use: No  . Sexual activity: Not on file  Other Topics Concern  . Not on file  Social History Narrative  . Not on file   Social Determinants of Health   Financial Resource Strain:   . Difficulty of Paying Living Expenses:   Food Insecurity:   . Worried About Programme researcher, broadcasting/film/video in the Last Year:   . Barista in the Last Year:   Transportation Needs: No Transportation Needs  . Lack of Transportation (Medical): No  . Lack of Transportation (Non-Medical): No  Physical Activity:   . Days of Exercise per Week:   . Minutes of Exercise per Session:   Stress:   . Feeling of Stress :   Social Connections:   . Frequency of Communication with Friends and Family:   . Frequency of Social Gatherings with Friends and Family:   . Attends Religious Services:   . Active Member of Clubs or Organizations:   . Attends Banker Meetings:   Marland Kitchen Marital Status:   Intimate Partner Violence:   . Fear of Current or Ex-Partner:   . Emotionally Abused:   Marland Kitchen Physically Abused:   . Sexually Abused:     No Known Allergies  Family History  Problem Relation Age of Onset  . Breast cancer Mother   . Coronary artery disease Father   . Rheum arthritis Father     Prior to Admission medications   Medication Sig Start Date End Date Taking? Authorizing Provider  acetaminophen (TYLENOL ARTHRITIS PAIN) 650 MG CR tablet Take 650 mg by mouth 2 (two) times daily.    Yes [provider]  albuterol (VENTOLIN HFA) 108 (90 Base) MCG/ACT inhaler Inhale 2 puffs into the lungs every 4 (four) hours as needed (shortness of breath, if you can't catch your breath). 06/04/20  Yes Nyoka Cowden, MD  aspirin 81 MG tablet Take 1 tablet (81 mg total) by mouth daily. 11/08/18  Yes McCue, Shanda Bumps, NP  atorvastatin (LIPITOR) 40 MG tablet TAKE 1 TABLET BY MOUTH  DAILY AT 6 PM. Patient taking differently: Take 40 mg by mouth every evening.  12/12/19  Yes Lars Masson, MD  CALCIUM PO Take 1 tablet by mouth daily.   Yes [provider]  Cholecalciferol (VITAMIN D3) 2000 UNITS TABS Take 2,000 Int'l Units by mouth daily.   Yes [provider]  citalopram (CELEXA) 40 MG tablet TAKE 1 TABLET BY MOUTH IN  THE MORNING Patient taking differently: Take 40 mg by mouth daily.  10/04/19  Yes [name], [name], MD  cyclobenzaprine (FLEXERIL) 5 MG tablet TAKE 1 TABLET BY MOUTH AT  BEDTIME AS NEEDED FOR  MUSCLE SPASM(S) Patient taking differently: Take 5 mg by mouth at bedtime as needed for muscle spasms.  05/01/20  Yes [name], [name], MD  diltiazem (CARDIZEM CD) 120 MG 24 hr capsule Take 1 capsule (120 mg total) by mouth at bedtime. 10/06/19  Yes Lars Masson, MD  furosemide (LASIX) 20 MG tablet TAKE 1 TABLET BY MOUTH  EVERY OTHER DAY Patient taking differently: Take 20 mg by mouth every other day.  05/30/20  Yes Lars Masson,  MD  golimumab (SIMPONI ARIA) 50 MG/4ML SOLN injection Inject 50 mg into the vein every 8 (eight) weeks.    Yes [provider]  leflunomide (ARAVA) 20 MG tablet Take 1 tablet (20 mg total) by mouth daily. 03/15/19  Yes [name], [name], MD  levothyroxine (SYNTHROID) 125 MCG tablet Take 1 tablet (125 mcg total) by mouth daily. 03/07/20  Yes [name], [name], MD  losartan (COZAAR) 50 MG tablet TAKE 1 TABLET BY MOUTH  DAILY Patient taking differently: Take 50 mg by mouth daily.  10/10/19  Yes Lars Masson, MD  montelukast (SINGULAIR) 10 MG tablet Take 1 tablet (10 mg total) by mouth at bedtime. 07/13/19  Yes Nyoka Cowden, MD  pantoprazole (PROTONIX) 40 MG tablet TAKE 1 TABLET BY MOUTH  DAILY BEFORE  BREAKFAST Patient taking differently: Take 40 mg by mouth daily.  05/22/20  Yes Nyoka Cowden, MD  predniSONE (DELTASONE) 10 MG tablet Take 10-40 mg by mouth See admin instructions. Take 4 tablets for 2 days, take 2 tablets for 2 days, then take 1 tablet for 2 days 01/31/20  Yes [provider]  SYMBICORT 160-4.5 MCG/ACT inhaler USE 2 INHALATIONS BY MOUTH  TWICE DAILY Patient taking differently: Inhale 2 puffs into the lungs in the morning and at bedtime.  11/23/19  Yes Lars Masson, MD  Tiotropium Bromide Monohydrate (SPIRIVA RESPIMAT) 2.5 MCG/ACT AERS 2 puffs each am Patient taking differently: Inhale 2 puffs into the lungs daily.  05/27/19  Yes Nyoka Cowden, MD  traMADol (ULTRAM) 50 MG tablet Take 1 tablet (50 mg total) by mouth 2 (two) times daily. 01/03/20  Yes Worthy Rancher B, FNP  triamcinolone cream (KENALOG) 0.1 % Apply 1 application topically 2 (two) times daily as needed (for psoriasis).    Yes [provider]  clonazePAM (KLONOPIN) 0.5 MG tablet Take 0.5 tablets (0.25 mg total) by mouth at bedtime as needed for anxiety. Patient not taking: Reported on 03/23/2020 06/17/19   [name], [name], MD  OXYGEN Inhale 2 L into the lungs continuous. continuous o2     [provider]    Physical Exam: Vitals:   06/07/20 1508 06/07/20 1616 06/07/20 1623 06/07/20 1748  BP: 139/79  128/69 119/63  Pulse: 98 90 (!) 110 88  Resp: 18   19  Temp: 98.4 F (36.9 C)   98.2 F (36.8 C)  TempSrc:      SpO2: 93% 95% 95% 97%     . General:  Appears calm and comfortable and is NAD . Eyes:  PERRL, EOMI, normal lids, iris . ENT:  grossly normal hearing, lips & tongue, mmm . Neck:  no LAD, masses or thyromegaly . Cardiovascular:  RRR, no m/r/g. No LE edema.  Marland Kitchen Respiratory:   diffuse wheezing with good air movement.  Normal respiratory effort. . Abdomen:  soft, NT, ND, NABS . Back:   normal alignment, no CVAT . Skin:  no rash or induration seen on limited  exam . Musculoskeletal:  grossly normal tone BUE/BLE, good ROM, no bony abnormality . Psychiatric:  grossly normal mood and affect, speech fluent and appropriate, AOx3 Neurologic:  CN 2-12 grossly intact, moves all extremities in coordinated fashion   Radiological Exams on Admission: DG Chest 2 View  Result Date: 06/06/2020 CLINICAL DATA:  Chest pressure and shortness of breath x3 days. EXAM: CHEST - 2 VIEW COMPARISON:  June 05, 2020 FINDINGS: Multiple sternal wires are seen. Mild to moderate severity stable, diffusely  increased interstitial Huff markings are noted. Mild, stable left basilar atelectasis and/or infiltrate is seen. There is no evidence of a pleural effusion or pneumothorax. The heart size and mediastinal contours are within normal limits. The visualized skeletal structures are unremarkable. IMPRESSION: 1. Mild, stable left basilar atelectasis and/or infiltrate. 2. Stable diffusely increased interstitial Huff markings which are likely chronic in nature. Electronically Signed   By: Aram Candela M.D.   On: 06/06/2020 17:38   DG Chest 2 View  Result Date: 06/05/2020 CLINICAL DATA:  Shortness of breath EXAM: CHEST - 2 VIEW COMPARISON:  10/04/2019, 10/30/2018, 09/15/2017 FINDINGS: Post sternotomy changes. Borderline cardiomegaly. Mild diffuse increased interstitial opacity likely due to chronic change. Streaky atelectasis left base. No pneumothorax. No significant pleural effusion. IMPRESSION: Borderline cardiomegaly. Mild chronic interstitial changes. Streaky atelectasis at the left base. Electronically Signed   By: Jasmine Pang M.D.   On: 06/05/2020 19:11    EKG: Independently reviewed.  NSR with rate 91; no evidence of acute ischemia   Labs on Admission: I have personally reviewed the available labs and imaging studies at the time of the admission.  Pertinent labs:   CO2 34 Glucose 136 BUN 23/Creatinine 1.04/GFR 53 - stable BNP 152.2 HS troponin 23, unchanged from 8/3 WBC  8.2 Hgb 10.0    Assessment/Plan Principal Problem:   COPD with acute exacerbation (HCC) Active Problems:   Hypothyroidism   Essential hypertension   Morbid (severe) obesity due to excess calories (HCC)   Hyperlipidemia   Acute on chronic respiratory failure with hypoxia (HCC)   Stage 3a chronic kidney disease   Elevated troponin   Acute on chronic respiratory failure associated with a COPD exacerbation -Patient's shortness of breath and productive cough are most likely caused by acute COPD exacerbation.  -She has history of O2-dependent COPD but is requiring additional O2 from baseline at this time -She does not have fever or leukocytosis.  -Chest x-ray is not consistent with pneumonia -She was given multiple neb treatments in the ER with improvement in symptoms - and also RVR (see below). -will observe overnight on telemetry -Continue Spiriva -Continue Symbicort [facility] formulary substitution) -Nebulizers: prn albuterol -Solu-Medrol 60 mg IV BID x 2 doses -> PO prednisone (was taking PO prednisone at home prior to admission since Monday of this week) -PO Doxycycline  Elevated troponin with h/o CAD -No dynamic EKG changes -Stable troponins from prior -Suspect demand ischemia in the setting of active infection -s/p CABG -Continue ASA  Afib -Patient with chronic afib -Continue PO Cardizem -She was given 2 additional IV doses of Cardizem with resolution of RVR  HTN  -Continue Cozaar  HLD -Continue Lipitor  Hypothyroidism -Check TSH -Continue Synthroid at current dose for now  Morbid obesity -BMI 40.8 -Weight loss should be encouraged -Outpatient PCP/bariatric medicine/bariatric surgery f/u encouraged  Depression -Continue Celexa  RA -She is on golimumab and so is immunocompromised  Stage 3a CKD -Continue Cozaar -Appears to be stable at this time -Repeat BMP in AM      Note: This patient has been tested and is negative for the novel coronavirus  COVID-19.    DVT prophylaxis: Lovenox  Code Status:  DNR - confirmed with patient Family Communication: Daughter was present at the completion of the visit Disposition Plan:  The patient is from: home  Anticipated d/c is to: home without [facility] services once her respiratory issues have been resolved.  Anticipated d/c date will depend on clinical response to treatment, but possibly as early as tomorrow  if she has excellent response to treatment  Patient is currently: acutely ill Consults called: PT/OT/Nutrition/RT  Admission status: It is my clinical opinion that referral for OBSERVATION is reasonable and necessary in this patient based on the above information provided. The aforementioned taken together are felt to place the patient at high risk for further clinical deterioration. However it is anticipated that the patient may be medically stable for discharge from the hospital within 24 to 48 hours.    Jonah Blue MD [facility]   How to contact the [facility] Attending or Consulting provider 7A - 7P or covering provider during after hours 7P -7A, for this patient?  1. Check the care team in [facility] and look for a) attending/consulting TR[facility] provider listed and b) the [facility] team listed 2. Log into [email or web address] and use [place]'s universal password to access. If you do not have the password, please contact the hospital operator. 3. Locate the [facility] provider you are looking for under [facility] and page to a number that you can be directly reached. 4. If you still have difficulty reaching the provider, please page the [facility] (Director on Call) for the Hospitalists listed on amion for assistance.   06/07/2020, 6:44 PM

## 2020-06-07 NOTE — Telephone Encounter (Signed)
Spoke with patient, daughter, and EMS.

## 2020-06-07 NOTE — ED Provider Notes (Signed)
[facility] EMERGENCY DEPARTMENT Provider Note   CSN: [id number] Arrival date & time: 06/06/20  1658     History Chief Complaint  Patient presents with  . Shortness of Breath    [name] is a 74 y.o. female.   Shortness of Breath Severity:  Mild Onset quality:  Gradual Duration:  2 days Timing:  Constant Progression:  Worsening Chronicity:  New Context: not activity   Relieved by:  Nothing Worsened by:  Nothing Ineffective treatments:  None tried Associated symptoms: cough   Associated symptoms: no abdominal pain and no fever        Past Medical History:  Diagnosis Date  . Acute bronchitis 09/25/2015  . ACUTE ON CHRONIC DIASTOLIC HEART FAILURE 12/04/2010   Qualifier: Diagnosis of  By: Ladona Ridgel, MD, [facility], Vergia Alcon   . Arthritis    OA RIGHT KNEE WITH PAIN  . Barrett esophagus   . Bradycardia 06/01/2015  . CHF (congestive heart failure) (HCC)   . Chronic respiratory failure (HCC)   . Chronic respiratory failure with hypoxia and hypercapnia (HCC) 02/04/2010   Followed in Pulmonary clinic/ [facility]/ [facility]       - 02 dependent  since 07/02/10 >>  83% RA December 05, 2010       - ONO RA 08/05/12  :  Positive sat < 89 x 2:74m> repeat on 2lpm rec 08/12/2012  - 06/17/2013 reported desat with activity p Knee surgery > rec restart 2lpm with activity  - 06/27/2013   Walked 2lpm  x one lap @ 185 stopped due to sat 88% not sob , desat to 82% on RA just at th  . COPD III spirometry if use FEV1/VC p saba  07/18/2010   Quit smoking May 2006       - PFT's  04/12/10 FEV1  1.21 (69%) ratio 77 and no change p B2,  DLC0 56%   VC 70%         - PFTs  08/08/2013 FEV1 1.21 (60%) ratio 86 and no change p B2 DLCO 79%  VC 72%  On symbicort 160 2bid  - PFT's  02/08/2018  FEV1 0.70 (40 % ) ratio 56 if use FEV1/VC  p 38 % improvement from saba p symb 160 prior to study with DLCO  78 % corrects to 147  % for alv volume   - 02/08/2018  . Cough variant asthma 02/26/2011    Followed in Pulmonary clinic/ [facility]/ [place]  - PFT's  06/04/15  FEV1 1.20 (67 % ) ratio 83  p 6 % improvement from saba with DLCO  80 % corrects to 132 % for alv volume      - Clinical dx based on response to symbicort       FENO 09/16/2016  =   96 on symbicort 160 2bid > added singulair  Allergy profile 09/16/2016 >  Eos 0.5 /  IgE  78 neg RAST  -  Referred to rehab 04/29/2017 > completed  . Essential hypertension 04/20/2007   Qualifier: Diagnosis of  By: Marcelyn Ditty RN, Katy Fitch   . GERD (gastroesophageal reflux disease)   . History of ARDS 2006  . History of home oxygen therapy    AT NIGHT WHEN SLEEPING 2 L / MIN NASAL CANNULA  . Hyperlipidemia 07/12/2015  . Hypothyroidism   . Morbid (severe) obesity due to excess calories (HCC) 04/22/2015   pfts with erv 14% 06/04/15  And 33% 02/08/2018   . NSTEMI (non-ST  elevated myocardial infarction) (HCC) 05/31/2015  . Pneumococcal pneumonia (HCC) 2006   HOSPITALIZED AND DEVELOPED ARDS  . Psoriatic arthritis (HCC)   . PULMONARY FIBROSIS ILD POST INFLAMMATORY CHRONIC 07/18/2010   Followed as Primary Care Patient/ [facility]/ [facility]  -s/p ARDS 2006 with bacteremic S  Pna       - CT chest 07/03/10 Nonspecific PF mostly upper lobes       - CT chest 12/03/10 acute gg changes and effusions c/w chf - PFT's  02/08/2018  FVC 0.64 (28 %)   with DLCO  78 % corrects to 147 % for alv volume     . Rheumatic disease   . 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 SOB (shortness of breath) on exertion   . Stroke [facility])     Patient Active Problem List   Diagnosis Date Noted  . Chronic respiratory failure with hypoxia and hypercapnia (HCC) 04/20/2019  . COPD exacerbation (HCC) 03/10/2019  . Acute on chronic respiratory failure with hypoxia (HCC) 03/10/2019  . Severe episode of recurrent major depressive disorder, without psychotic features (HCC) 11/09/2018  . COPD (chronic obstructive pulmonary disease) (HCC) 11/02/2018  . COPD with acute exacerbation (HCC) 11/01/2018  . CAP (community  acquired pneumonia) 10/28/2018  . ICH (intracerebral hemorrhage) (HCC) 08/25/2018  . Hyperlipidemia 07/12/2015  . 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 Bradycardia 06/01/2015  . NSTEMI (non-ST elevated myocardial infarction) (HCC) 05/31/2015  . Morbid (severe) obesity due to excess calories (HCC) 04/22/2015  . Postoperative anemia due to acute blood loss 06/08/2013  . History of home oxygen therapy 06/07/2013  . GERD (gastroesophageal reflux disease) 06/07/2013  . Barrett's esophagus 06/07/2013  . OA (osteoarthritis) of knee 06/06/2013  . ACUTE ON CHRONIC DIASTOLIC HEART FAILURE 12/04/2010  . COPD III spirometry if use FEV1/VC p saba  07/18/2010  . 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 Compression fracture of thoracic vertebra (HCC) 07/02/2010  . Hypothyroidism 04/20/2007  . Essential hypertension 04/20/2007  . PSORIATIC ARTHRITIS 04/20/2007    Past Surgical History:  Procedure Laterality Date  . ABDOMINOPLASTY    . CARDIAC CATHETERIZATION N/A 06/01/2015   Procedure: Left Heart Cath and Coronary Angiography;  Surgeon: Runell Gess, MD;  Location: [facility] INVASIVE CV LAB;  Service: Cardiovascular;  Laterality: N/A;  . CARPAL TUNNEL RELEASE    . CHOLECYSTECTOMY    . CORONARY ARTERY BYPASS GRAFT N/A 06/04/2015   Procedure: CORONARY ARTERY BYPASS GRAFT times three            with left internal mammary artery and right leg saphenous vein;  Surgeon: Alleen Borne, MD;  Location: [facility] OR;  Service: Open Heart Surgery;  Laterality: N/A;  . cosmetic breast surgery    . JOINT REPLACEMENT    . KNEE ARTHROSCOPY Left   . TEE WITHOUT CARDIOVERSION  06/04/2015   Procedure: TRANSESOPHAGEAL ECHOCARDIOGRAM (TEE);  Surgeon: Alleen Borne, MD;  Location: [facility] OR;  Service: Open Heart Surgery;;  . TOTAL KNEE ARTHROPLASTY Left   . TOTAL KNEE ARTHROPLASTY Right 06/06/2013   Procedure: RIGHT TOTAL KNEE ARTHROPLASTY;  Surgeon: Loanne Drilling, MD;  Location: [facility] ORS;  Service: Orthopedics;   Laterality: Right;     OB History   No obstetric history on file.     Family History  Problem Relation Age of Onset  . Breast cancer Mother   . Coronary artery disease Father   . Rheum arthritis Father     Social History   Tobacco Use  .  Smoking status: Former Smoker    Packs/day: 1.50    Years: 58.50    Pack years: 87.75    Types: Cigarettes    Quit date: 03/03/2005    Years since quitting: 15.2  . Smokeless tobacco: Never Used  Vaping Use  . Vaping Use: Never used  Substance Use Topics  . Alcohol use: No  . Drug use: No    Home Medications Prior to Admission medications   Medication Sig Start Date End Date Taking? Authorizing Provider  acetaminophen (TYLENOL ARTHRITIS PAIN) 650 MG CR tablet Take 650 mg by mouth 2 (two) times daily.    Yes [provider]  albuterol (VENTOLIN HFA) 108 (90 Base) MCG/ACT inhaler Inhale 2 puffs into the lungs every 4 (four) hours as needed (shortness of breath, if you can't catch your breath). 06/04/20  Yes Nyoka Cowden, MD  aspirin 81 MG tablet Take 1 tablet (81 mg total) by mouth daily. 11/08/18  Yes McCue, Shanda Bumps, NP  atorvastatin (LIPITOR) 40 MG tablet TAKE 1 TABLET BY MOUTH  DAILY AT 6 PM. Patient taking differently: Take 40 mg by mouth every evening.  12/12/19  Yes Lars Masson, MD  CALCIUM PO Take 1 tablet by mouth daily.   Yes [provider]  Cholecalciferol (VITAMIN D3) 2000 UNITS TABS Take 2,000 Int'l Units by mouth daily.   Yes [provider]  citalopram (CELEXA) 40 MG tablet TAKE 1 TABLET BY MOUTH IN  THE MORNING Patient taking differently: Take 40 mg by mouth daily.  10/04/19  Yes [name], [name], MD  cyclobenzaprine (FLEXERIL) 5 MG tablet TAKE 1 TABLET BY MOUTH AT  BEDTIME AS NEEDED FOR  MUSCLE SPASM(S) Patient taking differently: Take 5 mg by mouth at bedtime as needed for muscle spasms.  05/01/20  Yes [name], [name], MD  diltiazem (CARDIZEM CD) 120 MG 24 hr capsule Take 1  capsule (120 mg total) by mouth at bedtime. 10/06/19  Yes Lars Masson, MD  furosemide (LASIX) 20 MG tablet TAKE 1 TABLET BY MOUTH  EVERY OTHER DAY Patient taking differently: Take 20 mg by mouth every other day.  05/30/20  Yes Lars Masson, MD  golimumab (SIMPONI ARIA) 50 MG/4ML SOLN injection Inject 50 mg into the vein every 8 (eight) weeks.    Yes [provider]  leflunomide (ARAVA) 20 MG tablet Take 1 tablet (20 mg total) by mouth daily. 03/15/19  Yes [name], [name], MD  levothyroxine (SYNTHROID) 125 MCG tablet Take 1 tablet (125 mcg total) by mouth daily. 03/07/20  Yes [name], [name], MD  losartan (COZAAR) 50 MG tablet TAKE 1 TABLET BY MOUTH  DAILY Patient taking differently: Take 50 mg by mouth daily.  10/10/19  Yes Lars Masson, MD  montelukast (SINGULAIR) 10 MG tablet Take 1 tablet (10 mg total) by mouth at bedtime. 07/13/19  Yes Nyoka Cowden, MD  pantoprazole (PROTONIX) 40 MG tablet TAKE 1 TABLET BY MOUTH  DAILY BEFORE BREAKFAST Patient taking differently: Take 40 mg by mouth daily.  05/22/20  Yes Nyoka Cowden, MD  predniSONE (DELTASONE) 10 MG tablet Take 10-40 mg by mouth See admin instructions. Take 4 tablets for 2 days, take 2 tablets for 2 days, then take 1 tablet for 2 days 01/31/20  Yes [provider]  SYMBICORT 160-4.5 MCG/ACT inhaler USE 2 INHALATIONS BY MOUTH  TWICE DAILY Patient taking differently: Inhale 2 puffs into the lungs in the morning and at bedtime.  11/23/19  Yes Lars Masson, MD  Tiotropium Bromide Monohydrate (SPIRIVA RESPIMAT) 2.5 MCG/ACT AERS 2 puffs each am Patient taking differently: Inhale 2 puffs into the lungs daily.  05/27/19  Yes Nyoka Cowden, MD  traMADol (ULTRAM) 50 MG tablet Take 1 tablet (50 mg total) by mouth 2 (two) times daily. 01/03/20  Yes Worthy Rancher B, FNP  triamcinolone cream (KENALOG) 0.1 % Apply 1 application topically 2 (two) times daily as needed (for psoriasis).    Yes [provider]  clonazePAM (KLONOPIN) 0.5 MG tablet Take 0.5 tablets (0.25 mg total) by mouth at bedtime as needed for anxiety. Patient not taking: Reported on 03/23/2020 06/17/19   [name], [name], MD  OXYGEN Inhale 2 L into the lungs continuous. continuous o2     [provider]    Allergies    Patient has no known allergies.  Review of Systems   Review of Systems  Constitutional: Negative for fever.  Respiratory: Positive for cough and shortness of breath.   Gastrointestinal: Negative for abdominal pain.  All other systems reviewed and are negative.   Physical Exam Updated Vital Signs BP (!) 140/110   Pulse 92   Temp 98.4 F (36.9 C) (Oral)   Resp 19   SpO2 99%   Physical Exam Vitals and nursing note reviewed.  Constitutional:      Appearance: She is well-developed.  HENT:     Head: Normocephalic and atraumatic.     Mouth/Throat:     Mouth: Mucous membranes are moist.  Eyes:     Pupils: Pupils are equal, round, and reactive to light.  Cardiovascular:     Rate and Rhythm: Normal rate and regular rhythm.  Pulmonary:     Effort: Tachypnea present. No respiratory distress.     Breath sounds: No stridor. Decreased breath sounds and wheezing present.  Chest:     Chest wall: No mass or tenderness.  Abdominal:     General: There is no distension.  Musculoskeletal:        General: No swelling or tenderness.     Cervical back: Normal range of motion.     Right lower leg: No edema.     Left lower leg: No edema.  Skin:    General: Skin is warm and dry.  Neurological:     General: No focal deficit present.     Mental Status: She is alert.     ED Results / Procedures / Treatments   Labs (all labs ordered are listed, but only abnormal results are displayed) Labs Reviewed  CBC - Abnormal; Notable for the following components:      Result Value   RBC 3.59 (*)    Hemoglobin 10.0 (*)    HCT 34.9 (*)    MCHC 28.7 (*)    All other components within  normal limits  BASIC METABOLIC PANEL - Abnormal; Notable for the following components:   CO2 34 (*)    Glucose, Bld 136 (*)    Creatinine, Ser 1.04 (*)    GFR calc non Af Amer 53 (*)    All other components within normal limits  BRAIN NATRIURETIC PEPTIDE - Abnormal; Notable for the following components:   B Natriuretic Peptide 152.2 (*)    All other components within normal limits  TROPONIN I (HIGH SENSITIVITY) - Abnormal; Notable for the following components:   Troponin I (High Sensitivity) 23 (*)    All other components within normal limits  SARS CORONAVIRUS 2  BY RT PCR [facility] ORDER, PERFORMED IN [facility] LAB)    EKG EKG Interpretation  Date/Time:  Wednesday June 06 2020 17:06:09 EDT Ventricular Rate:  91 PR Interval:  176 QRS Duration: 84 QT Interval:  368 QTC Calculation: 452 R Axis:   80 Text Interpretation: Sinus rhythm with Premature atrial complexes Otherwise normal ECG No significant change since last tracing Confirmed by Marily Memos [phone]) on 06/07/2020 5:28:23 AM   Radiology DG Chest 2 View  Result Date: 06/06/2020 CLINICAL DATA:  Chest pressure and shortness of breath x3 days. EXAM: CHEST - 2 VIEW COMPARISON:  June 05, 2020 FINDINGS: Multiple sternal wires are seen. Mild to moderate severity stable, diffusely increased interstitial lung markings are noted. Mild, stable left basilar atelectasis and/or infiltrate is seen. There is no evidence of a pleural effusion or pneumothorax. The heart size and mediastinal contours are within normal limits. The visualized skeletal structures are unremarkable. IMPRESSION: 1. Mild, stable left basilar atelectasis and/or infiltrate. 2. Stable diffusely increased interstitial lung markings which are likely chronic in nature. Electronically Signed   By: Aram Candela M.D.   On: 06/06/2020 17:38   DG Chest 2 View  Result Date: 06/05/2020 CLINICAL DATA:  Shortness of breath EXAM: CHEST - 2 VIEW COMPARISON:  10/04/2019,  10/30/2018, 09/15/2017 FINDINGS: Post sternotomy changes. Borderline cardiomegaly. Mild diffuse increased interstitial opacity likely due to chronic change. Streaky atelectasis left base. No pneumothorax. No significant pleural effusion. IMPRESSION: Borderline cardiomegaly. Mild chronic interstitial changes. Streaky atelectasis at the left base. Electronically Signed   By: Jasmine Pang M.D.   On: 06/05/2020 19:11    Procedures .Critical Care Performed by: Marily Memos, MD Authorized by: Marily Memos, MD   Critical care provider statement:    Critical care time (minutes):  45   Critical care was necessary to treat or prevent imminent or life-threatening deterioration of the following conditions:  Respiratory failure   Critical care was time spent personally by me on the following activities:  Discussions with consultants, evaluation of patient's response to treatment, examination of patient, ordering and performing treatments and interventions, ordering and review of laboratory studies, ordering and review of radiographic studies, pulse oximetry, re-evaluation of patient's condition, obtaining history from patient or surrogate and review of old charts   (including critical care time)  Medications Ordered in ED Medications  predniSONE (DELTASONE) tablet 60 mg (has no administration in time range)  albuterol (PROVENTIL) (2.5 MG/3ML) 0.083% nebulizer solution 2.5 mg (has no administration in time range)  doxycycline (VIBRA-TABS) tablet 100 mg (100 mg Oral Given 06/07/20 0604)  ipratropium-albuterol (DUONEB) 0.5-2.5 (3) MG/3ML nebulizer solution 3 mL (3 mLs Nebulization Given 06/07/20 0555)  diltiazem (CARDIZEM CD) 24 hr capsule 120 mg (120 mg Oral Given 06/07/20 0605)  losartan (COZAAR) tablet 50 mg (50 mg Oral Given 06/07/20 0605)    ED Course  I have reviewed the triage vital signs and the nursing notes.  Pertinent labs & imaging results that were available during my care of the patient were  reviewed by me and considered in my medical decision making (see chart for details).    MDM Rules/Calculators/A&P                          Copd exacerbation with significant wheezing/tachypnea and still diminished breath sounds after multiple breathing treatmetns. No indication for bipap or invasive ventilation. Prednisone given. abx given. Will admit for more of same.   Final Clinical Impression(s) /  ED Diagnoses Final diagnoses:  COPD exacerbation (HCC)  Hypoxia    Rx / DC Orders ED Discharge Orders    None       Kaleigha Chamberlin, Barbara Cower, MD 06/07/20 [phone]

## 2020-06-07 NOTE — ED Notes (Signed)
Lunch tray ordered 

## 2020-06-07 NOTE — ED Notes (Signed)
Lunch Tray Ordered @ 1049. °

## 2020-06-08 ENCOUNTER — Ambulatory Visit: Payer: Medicare Other | Admitting: Internal Medicine

## 2020-06-08 DIAGNOSIS — E782 Mixed hyperlipidemia: Secondary | ICD-10-CM

## 2020-06-08 DIAGNOSIS — I10 Essential (primary) hypertension: Secondary | ICD-10-CM

## 2020-06-08 DIAGNOSIS — R778 Other specified abnormalities of plasma proteins: Secondary | ICD-10-CM

## 2020-06-08 DIAGNOSIS — N1831 Chronic kidney disease, stage 3a: Secondary | ICD-10-CM | POA: Diagnosis not present

## 2020-06-08 DIAGNOSIS — J9621 Acute and chronic respiratory failure with hypoxia: Secondary | ICD-10-CM

## 2020-06-08 DIAGNOSIS — J441 Chronic obstructive pulmonary disease with (acute) exacerbation: Secondary | ICD-10-CM | POA: Diagnosis not present

## 2020-06-08 LAB — BASIC METABOLIC PANEL
Anion gap: 8 (ref 5–15)
BUN: 32 mg/dL — ABNORMAL HIGH (ref 8–23)
CO2: 34 mmol/L — ABNORMAL HIGH (ref 22–32)
Calcium: 9.6 mg/dL (ref 8.9–10.3)
Chloride: 97 mmol/L — ABNORMAL LOW (ref 98–111)
Creatinine, Ser: 1.12 mg/dL — ABNORMAL HIGH (ref 0.44–1.00)
GFR calc Af Amer: 56 mL/min — ABNORMAL LOW (ref >60)
GFR calc non Af Amer: 49 mL/min — ABNORMAL LOW (ref >60)
Glucose, Bld: 147 mg/dL — ABNORMAL HIGH (ref 70–99)
Potassium: 5.1 mmol/L (ref 3.5–5.1)
Sodium: 139 mmol/L (ref 135–145)

## 2020-06-08 LAB — CBC
HCT: 31.3 % — ABNORMAL LOW (ref 36.0–46.0)
Hemoglobin: 9 g/dL — ABNORMAL LOW (ref 12.0–15.0)
MCH: 27.9 pg (ref 26.0–34.0)
MCHC: 28.8 g/dL — ABNORMAL LOW (ref 30.0–36.0)
MCV: 96.9 fL (ref 80.0–100.0)
Platelets: 180 10*3/uL (ref 150–400)
RBC: 3.23 MIL/uL — ABNORMAL LOW (ref 3.87–5.11)
RDW: 14.2 % (ref 11.5–15.5)
WBC: 9.8 10*3/uL (ref 4.0–10.5)
nRBC: 0 % (ref 0.0–0.2)

## 2020-06-08 MED ORDER — PREDNISONE 10 MG PO TABS
ORAL_TABLET | ORAL | 0 refills | Status: DC
Start: 2020-06-08 — End: 2020-06-15

## 2020-06-08 MED ORDER — IPRATROPIUM-ALBUTEROL 0.5-2.5 (3) MG/3ML IN SOLN: 3.0000 mL | Freq: Three times a day (TID) | RESPIRATORY_TRACT | Status: DC

## 2020-06-08 MED ORDER — IPRATROPIUM-ALBUTEROL 0.5-2.5 (3) MG/3ML IN SOLN
3.0000 mL | Freq: Four times a day (QID) | RESPIRATORY_TRACT | Status: DC
  Administered 2020-06-08: 3 mL via RESPIRATORY_TRACT
  Filled 2020-06-08: qty 3

## 2020-06-08 MED ORDER — DOXYCYCLINE HYCLATE 100 MG PO TABS: 100.0000 mg | ORAL_TABLET | Freq: Two times a day (BID) | ORAL | 0 refills | Status: AC

## 2020-06-08 NOTE — Evaluation (Signed)
Occupational Therapy Evaluation Patient Details Name: [name] MRN: [id number] DOB: Mar 20, 1946 Today's Date: 06/08/2020    History of Present Illness [name] is a 74 y.o. female with medical history significant of CVA; CAD s/p CABG; pulmonary fibrosis; psoriatic arthritis; morbid obesity; hypothyroidism; HTN; HLD; afib; COPD on home O2; and chronic diastolic CHF presenting with SOB.  She reports labored breathing, SOB.  She was having difficulty walking - fine with sitting but too exhausted to get up.  She felt "a little bit of foggy feeling."     Clinical Impression   PTA, pt lives alone and reports Independence with dressing, toileting, and sponge bathing (daughter assists with showering tasks 2x/wk). Pt able to complete simple meals and med mgmt tasks. Pt reports use of cane or walker outside of the home intermittently. Pt presents now with diagnoses above and deficits in strength and cardiopulmonary tolerance. Pt received on 2 L O2, desats to 85% during grooming tasks while standing at sink. Educated on energy conservation strategies with pt recovering within 60 seconds standing rest break, 20 seconds with seated rest break. Pt requires up to min guard for LB ADLs, Supervision for short distance mobility without AD. Recommend HHOT follow-up to maximize ADL/IADL independence. Also recommend [facility] aide follow-up to assist with showering tasks to decrease caregiver burden.    Follow Up Recommendations  Home health OT;Supervision - Intermittent (assistance with showering tasks)    Equipment Recommendations  None recommended by OT (has all needed DME)    Recommendations for Other Services Other (comment) (HH aide)     Precautions / Restrictions Precautions Precautions: Fall Precaution Comments: watch O2 Restrictions Weight Bearing Restrictions: No      Mobility Bed Mobility Overal bed mobility: Needs Assistance Bed Mobility: Supine to Sit     Supine to sit:  Supervision     General bed mobility comments: Supervision for safety, no assistance needed  Transfers Overall transfer level: Needs assistance Equipment used: None Transfers: Sit to/from Stand;Stand Pivot Transfers Sit to Stand: Supervision Stand pivot transfers: Supervision       General transfer comment: Supervision, no assistance needed    Balance Overall balance assessment: Needs assistance Sitting-balance support: Bilateral upper extremity supported;Feet supported Sitting balance-[name]: Good     Standing balance support: No upper extremity supported;During functional activity Standing balance-[name]: Good Standing balance comment: No LOB or physical assistance needed for balance without AD during short distance mobility                            ADL either performed or assessed with clinical judgement   ADL Overall ADL's : Needs assistance/impaired Eating/Feeding: Independent;Sitting   Grooming: Supervision/safety;Standing;Wash/dry face;Wash/dry hands;Oral care Grooming Details (indicate cue type and reason): Supervision for safety during grooming tasks standing at sink. Requires monitoring of O2 and cues for energy conservation Upper Body Bathing: Supervision/ safety;Sitting   Lower Body Bathing: Min guard;Sit to/from stand   Upper Body Dressing : Supervision/safety;Sitting   Lower Body Dressing: Min guard;Sit to/from stand Lower Body Dressing Details (indicate cue type and reason): reports wearing slip-on shoes, unable to don socks. Educated on energy conservation strategies for LB Dressing to bring B LE to self. Pt reports able to do this, but mostly wears dresses or gowns at home Toilet Transfer: Supervision/safety;Ambulation;Comfort height toilet Toilet Transfer Details (indicate cue type and reason): simulated in room without AD. Supervision for monitoring of O2 and safety Toileting- Clothing Manipulation and  Hygiene:  Supervision/safety;Sit to/from stand       Functional mobility during ADLs: Supervision/safety General ADL Comments: Pt with deficits in cardiopulmonary tolerance and strength, fatiguing easily during BADLs. Pt on 2 L O2 (reports sufficient amount for BADLs, but increases to 3 L O2 during heavier activities). On 2 L O2, pt desat to 85% for grooming standing at sink. Educated to use 3 L O2 for these tasks while recovering to maximize safety and breathing      Vision Baseline Vision/History: Wears glasses Wears Glasses: At all times Patient Visual Report: No change from baseline Vision Assessment?: No apparent visual deficits     Perception     Praxis      Pertinent Vitals/Pain Pain Assessment: No/denies pain     Hand Dominance Right   Extremity/Trunk Assessment Upper Extremity Assessment Upper Extremity Assessment: Generalized weakness (tremulous, reports side effect of medication)   Lower Extremity Assessment Lower Extremity Assessment: Defer to PT evaluation   Cervical / Trunk Assessment Cervical / Trunk Assessment: Normal   Communication Communication Communication: No difficulties   Cognition Arousal/Alertness: Awake/alert Behavior During Therapy: WFL for tasks assessed/performed Overall Cognitive Status: Within Functional Limits for tasks assessed                                 General Comments: Pt A&Ox4. reports some memory deficits at baseline due to previous CVA.    General Comments  Pt HR up to 120bpm during activity. Pt reports 2 L O2 sufficient for ADLs completed during session (increases to 3 L O2 at home for exertion). SpO2 desats to 85% during grooming, recovering to 90% within 60 seconds of standing rest break. Desats to 88% from sink to bed. Recovered within 20 seconds of seated rest break. Educated on energy conservation strategies to maximize safety and independence    Exercises     Shoulder Instructions      Home Living  Family/patient expects to be discharged to:: Private residence Living Arrangements: Alone Available Help at Discharge: Family;Available PRN/intermittently (daughter works) Type of Home: House Home Access: Stairs to enter Secretary/administrator of Steps: 4 Entrance Stairs-Rails: Right;Left Home Layout: Able to live on main level with bedroom/bathroom;Other (Comment) (basement, but pt does not use)     Bathroom Shower/Tub: Tub/shower unit     Bathroom Accessibility: No   Home Equipment: Walker - 2 wheels;Walker - 4 wheels;Cane - single point;Shower seat;Grab bars - tub/shower          Prior Functioning/Environment Level of Independence: Independent;Independent with assistive device(s)        Comments: Pt denies any falls this year. Pt reports use of cane or walker outside of the home. Pt reports Independent with dressing, toileting and sponge bathing tasks. Daughter assists with tub transfers/showering tasks 2x/wk. Family plan to hire an aide to assist with showering tasks. Pt reports able to cook simple meals and manage medications, daughter assists with laundry. Pt drives occasionally with someone with her. Has groceries delievered to home        OT Problem List: Decreased strength;Decreased activity tolerance;Cardiopulmonary status limiting activity      OT Treatment/Interventions: Self-care/ADL training;Therapeutic exercise;Energy conservation;DME and/or AE instruction;Therapeutic activities;Patient/family education    OT Goals(Current goals can be found in the care plan section) Acute Rehab OT Goals Patient Stated Goal: decrease SOB, get better OT Goal Formulation: With patient/family Time For Goal Achievement: 06/22/20 Potential to Achieve Goals: Good ADL Goals  Pt Will Perform Grooming: Independently;standing Pt Will Transfer to Toilet: with modified independence;ambulating;bedside commode;regular height toilet Pt Will Perform Toileting - Clothing Manipulation and  hygiene: sit to/from stand;with modified independence Additional ADL Goal #1: Pt to verbalize at least 3 energy conservation strategies to maximize ADL/IADL independence Additional ADL Goal #2: Pt to complete pill box test without errors in order to maximize IADL safety  OT Frequency: Min 2X/week   Barriers to D/C:            Co-evaluation              AM-PAC OT "6 Clicks" Daily Activity     Outcome Measure Help from another person eating meals?: None Help from another person taking care of personal grooming?: A Little Help from another person toileting, which includes using toliet, bedpan, or urinal?: A Little Help from another person bathing (including washing, rinsing, drying)?: A Little Help from another person to put on and taking off regular upper body clothing?: A Little Help from another person to put on and taking off regular lower body clothing?: A Little 6 Click Score: 19   End of Session Equipment Utilized During Treatment: Gait belt;Oxygen Nurse Communication: Mobility status;Other (comment) (SpO2)  Activity Tolerance: Patient tolerated treatment well Patient left: in bed;with call bell/phone within reach;with family/visitor present  OT Visit Diagnosis: Other (comment);Muscle weakness (generalized) (M62.81) (decreased cardiopulmonary tolerance)                Time: 2482-5003 OT Time Calculation (min): 32 min Charges:  OT General Charges $OT Visit: 1 Visit OT Evaluation $OT Eval Moderate Complexity: 1 Mod OT Treatments $Self Care/Home Management : 8-22 mins  Lorre Munroe, OTR/L  Lorre Munroe 06/08/2020, 9:46 AM

## 2020-06-08 NOTE — Progress Notes (Signed)
Nutrition Brief Note  RD consulted for assessment of nutrition requirements/ status.   Wt Readings from Last 15 Encounters:  06/08/20 92.8 kg  01/18/20 91.6 kg  11/08/19 92.6 kg  10/06/19 91.6 kg  07/15/19 93.3 kg  06/14/19 94.6 kg  04/18/19 92.4 kg  03/10/19 90.4 kg  01/06/19 91.4 kg  12/29/18 92.9 kg  11/24/18 93.3 kg  11/17/18 94 kg  11/09/18 94.9 kg  11/01/18 90.7 kg  10/22/18 97.7 kg   [name] is a 74 y.o. female with medical history significant of CVA; CAD s/p CABG; pulmonary fibrosis; psoriatic arthritis; morbid obesity; hypothyroidism; HTN; HLD; afib; COPD on home O2; and chronic diastolic CHF presenting with SOB.  She reports labored breathing, SOB.  Pt admitted with COPD exacerbation.   Reviewed I/O's: +240 ml x 24 hours  Spoke with pt, who was sitting in recliner chair at time of visit. She is very pleasant and reports feeling a lot better today. She endorses good appetite- observed breakfast meal tray, in which pt consumed about 90% of meal. PTA she was consuming 2 meals per day- Brunch of eggs, toast, and breakfast meat OR grits and eggs and dinner of meat, starch, and vegetables. She reports she has been trying to gradually lose weight by making healthier food choices. However, pt reports she has not lost any weight recently. Reviewed wt hx; wt has been stable over the past year.  Nutrition-Focused physical exam completed. Findings are no fat depletion, no muscle depletion, and mild edema.   Medications reviewed and include cardizem and colace.   Labs reviewed.   Body mass index is 41.3 kg/m. Patient meets criteria for extreme obesity, class III based on current BMI. Obesity is a complex, chronic medical condition that is optimally managed by a multidisciplinary care team. Weight loss is not an ideal goal for an acute inpatient hospitalization. However, if further work-up for obesity is warranted, consider outpatient referral to outpatient bariatric  service and/or [facility].   Current diet order is regular, patient is consuming approximately 100% of meals at this time. Labs and medications reviewed.   No nutrition interventions warranted at this time. If nutrition issues arise, please consult RD.   [name], RD, LDN, CDCES Registered Dietitian II Certified Diabetes Care and Education Specialist Please refer to [name] for RD and/or RD on-call/weekend/after hours pager

## 2020-06-08 NOTE — TOC Initial Note (Signed)
Transition of Care [facility]) - Initial/Assessment Note    Patient Details  Name: [name] MRN: [id number] Date of Birth: 1946/09/09  Transition of Care [facility]) CM/SW Contact:    Gala Lewandowsky, RN Phone Number: 06/08/2020, 4:02 PM  Clinical Narrative: Patient presented for COPD. Patient is from home with portable oxygen and she has a concentrator as well. Patient has support of daughter. Medicare.gov list provided to the patient and she chose [facility] made and start of care to begin within 24-48 hours post transition home. Daughter provided transportation home. No further needs from Case Manager.                 Expected Discharge Plan: Home w Home Health Services Barriers to Discharge: No Barriers Identified   Patient Goals and CMS Choice Patient states their goals for this hospitalization and ongoing recovery are:: to return home CMS Medicare.gov Compare Post Acute Care list provided to:: Patient Choice offered to / list presented to : Patient  Expected Discharge Plan and Services Expected Discharge Plan: Home w Home Health Services In-house Referral: NA Discharge Planning Services: CM Consult Post Acute Care Choice: Home Health Living arrangements for the past 2 months: Single Family Home Expected Discharge Date: 06/08/20               DME Arranged: N/A         HH Arranged: PT HH [facility] Date HH Agency Contacted: 06/08/20 Time HH Agency Contacted: [phone] Representative spoke with at [facility] Agency: Tiffany  Prior Living Arrangements/Services Living arrangements for the past 2 months: Single Family Home Lives with:: Self (support of daughter.) Patient language and need for interpreter reviewed:: Yes Do you feel safe going back to the place where you live?: Yes      Need for Family Participation in Patient Care: Yes (Comment) Care giver support system in place?: Yes (comment)   Criminal Activity/Legal  Involvement Pertinent to Current Situation/Hospitalization: No - Comment as needed  Activities of Daily Living      Permission Sought/Granted Permission sought to share information with : Facility Medical sales representative, Sports coach, Family Supports Permission granted to share information with : Yes, Verbal Permission Granted     Permission granted to share info w AGENCY: [facility].        Emotional Assessment Appearance:: Appears stated age Attitude/Demeanor/Rapport: Engaged Affect (typically observed): Appropriate Orientation: : Oriented to Situation, Oriented to  Time, Oriented to Place, Oriented to Self Alcohol / Substance Use: Not Applicable Psych Involvement: No (comment)  Admission diagnosis:  Hypoxia [R09.02] COPD exacerbation (HCC) [J44.1] COPD with acute exacerbation (HCC) [J44.1] Patient Active Problem List   Diagnosis Date Noted  . Stage 3a chronic kidney disease 06/07/2020  . Elevated troponin 06/07/2020  . Chronic respiratory failure with hypoxia and hypercapnia (HCC) 04/20/2019  . COPD exacerbation (HCC) 03/10/2019  . Acute on chronic respiratory failure with hypoxia (HCC) 03/10/2019  . Severe episode of recurrent major depressive disorder, without psychotic features (HCC) 11/09/2018  . COPD (chronic obstructive pulmonary disease) (HCC) 11/02/2018  . COPD with acute exacerbation (HCC) 11/01/2018  . CAP (community acquired pneumonia) 10/28/2018  . ICH (intracerebral hemorrhage) (HCC) 08/25/2018  . Hyperlipidemia 07/12/2015  . 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 Bradycardia 06/01/2015  . NSTEMI (non-ST elevated myocardial infarction) (HCC) 05/31/2015  . Morbid (severe) obesity due to excess calories (HCC) 04/22/2015  . Postoperative anemia due to acute blood loss 06/08/2013  .  History of home oxygen therapy 06/07/2013  . GERD (gastroesophageal reflux disease) 06/07/2013  . Barrett's esophagus 06/07/2013  . OA (osteoarthritis) of knee 06/06/2013  . ACUTE ON  CHRONIC DIASTOLIC HEART FAILURE 12/04/2010  . COPD III spirometry if use FEV1/VC p saba  07/18/2010  . 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 Compression fracture of thoracic vertebra (HCC) 07/02/2010  . Hypothyroidism 04/20/2007  . Essential hypertension 04/20/2007  . PSORIATIC ARTHRITIS 04/20/2007   PCP:  [name], [name], MD Pharmacy:   [facility] [phone] [place], [place] - [phone] [facility] AT [facility] [address] [place] [address] Phone: [phone] Fax: [phone]  [facility] SERVICE - [place], [place] - [address] [place], [address] [place], [address] [place] [address] Phone: [phone] Fax: [phone]  Readmission Risk Interventions No flowsheet data found.

## 2020-06-08 NOTE — Progress Notes (Signed)
PT Cancellation Note  Patient Details Name: [name] MRN: [id number] DOB: [birth year]   Cancelled Treatment:    Reason Eval/Treat Not Completed: Other (comment)  Checked around 1300 and pt eating/watching her "soaps" and requested PT return later.  PT returned at 1545 and pt dressed and transferring to chair for d/c.   Anise Salvo, PT Acute Rehab Services Pager [phone] [facility] [phone]   [name] 06/08/2020, 3:49 PM

## 2020-06-08 NOTE — Progress Notes (Signed)
D/C instructions given and reviewed with pt and daughter. Questions answered. Tele and IV removed. Tolerated well. Awaiting CM to discuss HH.

## 2020-06-08 NOTE — Plan of Care (Signed)
  Problem: Clinical Measurements: Goal: Respiratory complications will improve Outcome: Progressing   Problem: Activity: Goal: Risk for activity intolerance will decrease Outcome: Progressing   Problem: Coping: Goal: Level of anxiety will decrease Outcome: Progressing   Problem: Safety: Goal: Ability to remain free from injury will improve Outcome: Progressing   

## 2020-06-08 NOTE — Discharge Summary (Signed)
Physician Discharge Summary  [name] QZR:[id number] DOB: December 05, 1945 DOA: 06/06/2020  PCP: [name], [name], MD  Admit date: 06/06/2020 Discharge date: 06/08/2020  Admitted From: Home Disposition:  Discharged to home.   Recommendations for Outpatient Follow-up:  1. Follow up with PCP in 1 weeks 2. Please obtain BMP/CBC in one week  Discharge Condition: Stable  CODE STATUS: DNR   Brief/Interim Summary: Amber Cooley Collinsis a 74 y.o.femalewith medical history significant ofCVA; CAD s/p CABG; pulmonary fibrosis; psoriatic arthritis; morbid obesity; hypothyroidism; HTN; HLD; afib; COPD on home O2; and chronic diastolic CHF presenting with SOB.She reports labored breathing, SOB. She was having difficulty walking - fine with sitting but too exhausted to get up. She felt "a little bit of foggy feeling." She came to the ER Monday night (her doctor sent her) and was too exhausted to stay. Her doctor called her back and sent her back to the ER. Increased phlegm from usual, change in color. She has been hospitalized for COPD exacerbations in the past. She wears 2L at home, 3L with exertion. No fever. Mild chest discomfort in the beginning, and she thinks that was related to overexerting herself.  8/6: She is back to baseline O2 and resp status after steroids and nebs. She feels well enough to go home. Will need follow up with PCP in 1 week. Discharge instructions/plan explained in detail to pt/dtr. They have voiced agreement and understanding.   Discharge Diagnoses:   Acute on chronic respiratory failure associated with a COPD exacerbation     - She has history of O2-dependent COPD but is requiring additional O2 from baseline at this time     - Continue Spiriva; Symbicort [facility] formulary substitution)     - Nebulizers: prn albuterol     - Solu-Medrol 60 mg IV BID x 2 doses -> PO prednisone (was taking PO prednisone at home prior to admission since Monday of this  week)     - PO Doxycycline x 5 days; long steroid taper at discharge; follow up with PCP and pulmonology  Elevated troponin with h/o CAD     - No dynamic EKG changes     - Suspect demand ischemia in the setting of active infection     - s/p CABG     - Continue ASA     - trp are flat; denies CP  Afib     - episode of RVR s/p 2 IV doses of cardizem     - continue home PO cardizem  HTN      - cozaar  HLD     - lipitor  Hypothyroidism     - TSH ordered     - continue home synthroid  Morbid obesity     - BMI 40.8     - lifestyle changes recommended; follow up w/ PCP  Depression     - celexa  RA     - She is on golimumab and so is immunocompromised     - continue follow up with rheum  Stage 3a CKD     - baseline SCr 0.9 - 1.1     - she is at baseline   Discharge Instructions   Allergies as of 06/08/2020   No Known Allergies     Medication List    TAKE these medications   albuterol 108 (90 Base) MCG/ACT inhaler Commonly known as: VENTOLIN HFA Inhale 2 puffs into the lungs every 4 (four) hours as needed (shortness of breath, if you  can't catch your breath).   aspirin 81 MG tablet Take 1 tablet (81 mg total) by mouth daily.   atorvastatin 40 MG tablet Commonly known as: LIPITOR TAKE 1 TABLET BY MOUTH  DAILY AT 6 PM. What changed:   how much to take  how to take this  when to take this  additional instructions   CALCIUM PO Take 1 tablet by mouth daily.   citalopram 40 MG tablet Commonly known as: CELEXA TAKE 1 TABLET BY MOUTH IN  THE MORNING What changed: when to take this   cyclobenzaprine 5 MG tablet Commonly known as: FLEXERIL TAKE 1 TABLET BY MOUTH AT  BEDTIME AS NEEDED FOR  MUSCLE SPASM(S) What changed:   how much to take  how to take this  when to take this  reasons to take this  additional instructions   diltiazem 120 MG 24 hr capsule Commonly known as: CARDIZEM CD Take 1 capsule (120 mg total) by mouth at bedtime.    doxycycline 100 MG tablet Commonly known as: VIBRA-TABS Take 1 tablet (100 mg total) by mouth every 12 (twelve) hours for 5 days.   furosemide 20 MG tablet Commonly known as: LASIX TAKE 1 TABLET BY MOUTH  EVERY OTHER DAY   leflunomide 20 MG tablet Commonly known as: Arava Take 1 tablet (20 mg total) by mouth daily.   levothyroxine 125 MCG tablet Commonly known as: SYNTHROID Take 1 tablet (125 mcg total) by mouth daily.   losartan 50 MG tablet Commonly known as: COZAAR TAKE 1 TABLET BY MOUTH  DAILY   montelukast 10 MG tablet Commonly known as: SINGULAIR Take 1 tablet (10 mg total) by mouth at bedtime.   OXYGEN Inhale 2 L into the lungs continuous. continuous o2   pantoprazole 40 MG tablet Commonly known as: PROTONIX TAKE 1 TABLET BY MOUTH  DAILY BEFORE BREAKFAST What changed: See the new instructions.   predniSONE 10 MG tablet Commonly known as: DELTASONE Take 4 tablets (40 mg total) by mouth daily for 3 days, THEN 2 tablets (20 mg total) daily for 2 days, THEN 1 tablet (10 mg total) daily for 2 days. Start taking on: June 08, 2020 What changed: See the new instructions.   Simponi Aria 50 MG/4ML Soln injection Generic drug: golimumab Inject 50 mg into the vein every 8 (eight) weeks.   Spiriva Respimat 2.5 MCG/ACT Aers Generic drug: Tiotropium Bromide Monohydrate 2 puffs each am What changed:   how much to take  how to take this  when to take this  additional instructions   Symbicort 160-4.5 MCG/ACT inhaler Generic drug: budesonide-formoterol USE 2 INHALATIONS BY MOUTH  TWICE DAILY What changed: See the new instructions.   traMADol 50 MG tablet Commonly known as: ULTRAM Take 1 tablet (50 mg total) by mouth 2 (two) times daily.   triamcinolone cream 0.1 % Commonly known as: KENALOG Apply 1 application topically 2 (two) times daily as needed (for psoriasis).   Tylenol Arthritis Pain 650 MG CR tablet Generic drug: acetaminophen Take 650 mg by mouth  2 (two) times daily.   Vitamin D3 50 MCG (2000 UT) Tabs Take 2,000 Int'l Units by mouth daily.       No Known Allergies  Consultations:  None  Procedures/Studies: DG Chest 2 View  Result Date: 06/06/2020 CLINICAL DATA:  Chest pressure and shortness of breath x3 days. EXAM: CHEST - 2 VIEW COMPARISON:  June 05, 2020 FINDINGS: Multiple sternal wires are seen. Mild to moderate severity stable, diffusely increased interstitial  lung markings are noted. Mild, stable left basilar atelectasis and/or infiltrate is seen. There is no evidence of a pleural effusion or pneumothorax. The heart size and mediastinal contours are within normal limits. The visualized skeletal structures are unremarkable. IMPRESSION: 1. Mild, stable left basilar atelectasis and/or infiltrate. 2. Stable diffusely increased interstitial lung markings which are likely chronic in nature. Electronically Signed   By: Aram Candela M.D.   On: 06/06/2020 17:38   DG Chest 2 View  Result Date: 06/05/2020 CLINICAL DATA:  Shortness of breath EXAM: CHEST - 2 VIEW COMPARISON:  10/04/2019, 10/30/2018, 09/15/2017 FINDINGS: Post sternotomy changes. Borderline cardiomegaly. Mild diffuse increased interstitial opacity likely due to chronic change. Streaky atelectasis left base. No pneumothorax. No significant pleural effusion. IMPRESSION: Borderline cardiomegaly. Mild chronic interstitial changes. Streaky atelectasis at the left base. Electronically Signed   By: Jasmine Pang M.D.   On: 06/05/2020 19:11     Subjective: "I think I'm ready."  Discharge Exam: Vitals:   06/08/20 1152 06/08/20 1153  BP: 105/63   Pulse: 79   Resp: 18   Temp: 98.7 F (37.1 C)   SpO2: 97% 98%   Vitals:   06/08/20 0856 06/08/20 0932 06/08/20 1152 06/08/20 1153  BP: (!) 155/67  105/63   Pulse: 98  79   Resp:   18   Temp:   98.7 F (37.1 C)   TempSrc:   Oral   SpO2:  98% 97% 98%  Weight:        General: 74 y.o. female resting in chair in  NAD Cardiovascular: RRR, +S1, S2, no m/g/r, equal pulses throughout Respiratory: decreased at bases, no w/r/r, normal WOB on 2L [place] GI: BS+, NDNT, no masses noted, no organomegaly noted MSK: No e/c/c Neuro: A&O x 3, no focal deficits Psyc: Appropriate interaction and affect, calm/cooperative  The results of significant diagnostics from this hospitalization (including imaging, microbiology, ancillary and laboratory) are listed below for reference.     Microbiology: Recent Results (from the past 240 hour(s))  SARS Coronavirus 2 by RT PCR (hospital order, performed in [facility] hospital lab) Nasopharyngeal Nasopharyngeal Swab     Status: None   Collection Time: 06/07/20  8:18 AM   Specimen: Nasopharyngeal Swab  Result Value Ref Range Status   SARS Coronavirus 2 NEGATIVE NEGATIVE Final    Comment: (NOTE) SARS-CoV-2 target nucleic acids are NOT DETECTED.  The SARS-CoV-2 RNA is generally detectable in upper and lower respiratory specimens during the acute phase of infection. The lowest concentration of SARS-CoV-2 viral copies this assay can detect is 250 copies / mL. A negative result does not preclude SARS-CoV-2 infection and should not be used as the sole basis for treatment or other patient management decisions.  A negative result may occur with improper specimen collection / handling, submission of specimen other than nasopharyngeal swab, presence of viral mutation(s) within the areas targeted by this assay, and inadequate number of viral copies (<250 copies / mL). A negative result must be combined with clinical observations, patient history, and epidemiological information.  Fact Sheet for Patients:   BoilerBrush.com.cy  Fact Sheet for Healthcare Providers: https://pope.com/  This test is not yet approved or  cleared by the [place] FDA and has been authorized for detection and/or diagnosis of SARS-CoV-2 by FDA under an  Emergency Use Authorization (EUA).  This EUA will remain in effect (meaning this test can be used) for the duration of the COVID-19 declaration under Section 564(b)(1) of the Act, 21 U.S.C. section 360bbb-3(b)(1),  unless the authorization is terminated or revoked sooner.  Performed at [facility], [address] [place], [place] [address]      Labs: BNP (last 3 results) Recent Labs    06/07/20 0600  BNP 152.2*   Basic Metabolic Panel: Recent Labs  Lab 06/05/20 1837 06/06/20 1722 06/08/20 0729  NA 138 139 139  K 5.0 5.1 5.1  CL 93* 98 97*  CO2 33* 34* 34*  GLUCOSE 166* 136* 147*  BUN 28* 23 32*  CREATININE 1.16* 1.04* 1.12*  CALCIUM 9.3 9.3 9.6   Liver Function Tests: No results for input(s): AST, ALT, ALKPHOS, BILITOT, PROT, ALBUMIN in the last 168 hours. No results for input(s): LIPASE, AMYLASE in the last 168 hours. No results for input(s): AMMONIA in the last 168 hours. CBC: Recent Labs  Lab 06/05/20 1837 06/06/20 1722 06/08/20 0729  WBC 8.2 8.2 9.8  HGB 9.4* 10.0* 9.0*  HCT 32.5* 34.9* 31.3*  MCV 97.6 97.2 96.9  PLT 201 217 180   Cardiac Enzymes: No results for input(s): CKTOTAL, CKMB, CKMBINDEX, TROPONINI in the last 168 hours. BNP: Invalid input(s): POCBNP CBG: No results for input(s): GLUCAP in the last 168 hours. D-Dimer No results for input(s): DDIMER in the last 72 hours. Hgb A1c No results for input(s): HGBA1C in the last 72 hours. Lipid Profile No results for input(s): CHOL, HDL, LDLCALC, TRIG, CHOLHDL, LDLDIRECT in the last 72 hours. Thyroid function studies No results for input(s): TSH, T4TOTAL, T3FREE, THYROIDAB in the last 72 hours.  Invalid input(s): FREET3 Anemia work up No results for input(s): VITAMINB12, FOLATE, FERRITIN, TIBC, IRON, RETICCTPCT in the last 72 hours. Urinalysis    Component Value Date/Time   COLORURINE YELLOW 05/31/2013 1412   APPEARANCEUR CLEAR 05/31/2013 1412   LABSPEC 1.027 05/31/2013 1412    PHURINE 5.0 05/31/2013 1412   GLUCOSEU NEGATIVE 05/31/2013 1412   HGBUR NEGATIVE 05/31/2013 1412   HGBUR negative 07/18/2010 0941   BILIRUBINUR SMALL (A) 05/31/2013 1412   BILIRUBINUR n 08/10/2012 1131   KETONESUR NEGATIVE 05/31/2013 1412   PROTEINUR 30 (A) 05/31/2013 1412   UROBILINOGEN 0.2 05/31/2013 1412   NITRITE NEGATIVE 05/31/2013 1412   LEUKOCYTESUR NEGATIVE 05/31/2013 1412   Sepsis Labs Invalid input(s): PROCALCITONIN,  WBC,  LACTICIDVEN Microbiology Recent Results (from the past 240 hour(s))  SARS Coronavirus 2 by RT PCR (hospital order, performed in [facility] hospital lab) Nasopharyngeal Nasopharyngeal Swab     Status: None   Collection Time: 06/07/20  8:18 AM   Specimen: Nasopharyngeal Swab  Result Value Ref Range Status   SARS Coronavirus 2 NEGATIVE NEGATIVE Final    Comment: (NOTE) SARS-CoV-2 target nucleic acids are NOT DETECTED.  The SARS-CoV-2 RNA is generally detectable in upper and lower respiratory specimens during the acute phase of infection. The lowest concentration of SARS-CoV-2 viral copies this assay can detect is 250 copies / mL. A negative result does not preclude SARS-CoV-2 infection and should not be used as the sole basis for treatment or other patient management decisions.  A negative result may occur with improper specimen collection / handling, submission of specimen other than nasopharyngeal swab, presence of viral mutation(s) within the areas targeted by this assay, and inadequate number of viral copies (<250 copies / mL). A negative result must be combined with clinical observations, patient history, and epidemiological information.  Fact Sheet for Patients:   BoilerBrush.com.cy  Fact Sheet for Healthcare Providers: https://pope.com/  This test is not yet approved or  cleared by the  [place] [employer] trader and has been authorized for detection and/or diagnosis of SARS-CoV-2 by FDA under an  TEFL teacher (EUA).  This EUA will remain in effect (meaning this test can be used) for the duration of the COVID-19 declaration under Section 564(b)(1) of the Act, 21 U.S.C. section 360bbb-3(b)(1), unless the authorization is terminated or revoked sooner.  Performed at [facility], [address], [place], [place] [address]      Time coordinating discharge: 35 minutes  SIGNED:   Teddy Spike, DO  [facility] 06/08/2020, 2:47 PM   If 7PM-7AM, please contact night-coverage [email or web address]

## 2020-06-08 NOTE — Progress Notes (Signed)
Per verbal order from Ronaldo Miyamoto, MD at bedside, order patient DuoNebs every 6 hours with first treatment scheduled for now. RN carried out orders.

## 2020-06-12 ENCOUNTER — Telehealth: Payer: Self-pay | Admitting: Internal Medicine

## 2020-06-12 ENCOUNTER — Other Ambulatory Visit: Payer: Self-pay | Admitting: Internal Medicine

## 2020-06-12 DIAGNOSIS — M8580 Other specified disorders of bone density and structure, unspecified site: Secondary | ICD-10-CM | POA: Diagnosis not present

## 2020-06-12 DIAGNOSIS — E669 Obesity, unspecified: Secondary | ICD-10-CM | POA: Diagnosis not present

## 2020-06-12 DIAGNOSIS — M0589 Other rheumatoid arthritis with rheumatoid factor of multiple sites: Secondary | ICD-10-CM | POA: Diagnosis not present

## 2020-06-12 DIAGNOSIS — I252 Old myocardial infarction: Secondary | ICD-10-CM | POA: Diagnosis not present

## 2020-06-12 DIAGNOSIS — Z6841 Body Mass Index (BMI) 40.0 and over, adult: Secondary | ICD-10-CM | POA: Diagnosis not present

## 2020-06-12 DIAGNOSIS — L409 Psoriasis, unspecified: Secondary | ICD-10-CM | POA: Diagnosis not present

## 2020-06-12 DIAGNOSIS — Z79899 Other long term (current) drug therapy: Secondary | ICD-10-CM | POA: Diagnosis not present

## 2020-06-12 DIAGNOSIS — M150 Primary generalized (osteo)arthritis: Secondary | ICD-10-CM | POA: Diagnosis not present

## 2020-06-12 NOTE — Telephone Encounter (Signed)
Maggie from [facility] stated the pt agreed to their services and would like to start tomorrow. They need a verbal ok from PCP?  Seward Grater can be reached at [phone] to leave a message

## 2020-06-13 ENCOUNTER — Telehealth: Payer: Self-pay | Admitting: Internal Medicine

## 2020-06-13 DIAGNOSIS — Z951 Presence of aortocoronary bypass graft: Secondary | ICD-10-CM | POA: Diagnosis not present

## 2020-06-13 DIAGNOSIS — D631 Anemia in chronic kidney disease: Secondary | ICD-10-CM | POA: Diagnosis not present

## 2020-06-13 DIAGNOSIS — Z9181 History of falling: Secondary | ICD-10-CM | POA: Diagnosis not present

## 2020-06-13 DIAGNOSIS — K2270 Barrett's esophagus without dysplasia: Secondary | ICD-10-CM | POA: Diagnosis not present

## 2020-06-13 DIAGNOSIS — K219 Gastro-esophageal reflux disease without esophagitis: Secondary | ICD-10-CM | POA: Diagnosis not present

## 2020-06-13 DIAGNOSIS — L4050 Arthropathic psoriasis, unspecified: Secondary | ICD-10-CM | POA: Diagnosis not present

## 2020-06-13 DIAGNOSIS — N1831 Chronic kidney disease, stage 3a: Secondary | ICD-10-CM | POA: Diagnosis not present

## 2020-06-13 DIAGNOSIS — Z87891 Personal history of nicotine dependence: Secondary | ICD-10-CM | POA: Diagnosis not present

## 2020-06-13 DIAGNOSIS — I130 Hypertensive heart and chronic kidney disease with heart failure and stage 1 through stage 4 chronic kidney disease, or unspecified chronic kidney disease: Secondary | ICD-10-CM | POA: Diagnosis not present

## 2020-06-13 DIAGNOSIS — I5033 Acute on chronic diastolic (congestive) heart failure: Secondary | ICD-10-CM | POA: Diagnosis not present

## 2020-06-13 DIAGNOSIS — J441 Chronic obstructive pulmonary disease with (acute) exacerbation: Secondary | ICD-10-CM | POA: Diagnosis not present

## 2020-06-13 DIAGNOSIS — J9621 Acute and chronic respiratory failure with hypoxia: Secondary | ICD-10-CM | POA: Diagnosis not present

## 2020-06-13 DIAGNOSIS — J440 Chronic obstructive pulmonary disease with acute lower respiratory infection: Secondary | ICD-10-CM | POA: Diagnosis not present

## 2020-06-13 DIAGNOSIS — M1710 Unilateral primary osteoarthritis, unspecified knee: Secondary | ICD-10-CM | POA: Diagnosis not present

## 2020-06-13 DIAGNOSIS — S22000D Wedge compression fracture of unspecified thoracic vertebra, subsequent encounter for fracture with routine healing: Secondary | ICD-10-CM | POA: Diagnosis not present

## 2020-06-13 DIAGNOSIS — E039 Hypothyroidism, unspecified: Secondary | ICD-10-CM | POA: Diagnosis not present

## 2020-06-13 DIAGNOSIS — Z6829 Body mass index (BMI) 29.0-29.9, adult: Secondary | ICD-10-CM | POA: Diagnosis not present

## 2020-06-13 DIAGNOSIS — F332 Major depressive disorder, recurrent severe without psychotic features: Secondary | ICD-10-CM | POA: Diagnosis not present

## 2020-06-13 DIAGNOSIS — J8410 Pulmonary fibrosis, unspecified: Secondary | ICD-10-CM | POA: Diagnosis not present

## 2020-06-13 DIAGNOSIS — J189 Pneumonia, unspecified organism: Secondary | ICD-10-CM | POA: Diagnosis not present

## 2020-06-13 DIAGNOSIS — J9612 Chronic respiratory failure with hypercapnia: Secondary | ICD-10-CM | POA: Diagnosis not present

## 2020-06-13 DIAGNOSIS — I252 Old myocardial infarction: Secondary | ICD-10-CM | POA: Diagnosis not present

## 2020-06-13 NOTE — Telephone Encounter (Signed)
Rolm Gala with [facility] [phone] need   Physical Therapy orders needed for: twice a week for 4wks Once a week for 3wk And to take oxygen saturation readings

## 2020-06-13 NOTE — Telephone Encounter (Signed)
Left message on machine for [name] with verbal orders.  I also let him know that verbal orders were given to [facility].

## 2020-06-13 NOTE — Telephone Encounter (Signed)
Verbal orders given to [facility] for PT.

## 2020-06-13 NOTE — Telephone Encounter (Signed)
ok 

## 2020-06-14 ENCOUNTER — Encounter: Payer: Self-pay | Admitting: Internal Medicine

## 2020-06-15 DIAGNOSIS — J189 Pneumonia, unspecified organism: Secondary | ICD-10-CM | POA: Diagnosis not present

## 2020-06-15 DIAGNOSIS — J440 Chronic obstructive pulmonary disease with acute lower respiratory infection: Secondary | ICD-10-CM | POA: Diagnosis not present

## 2020-06-15 DIAGNOSIS — I130 Hypertensive heart and chronic kidney disease with heart failure and stage 1 through stage 4 chronic kidney disease, or unspecified chronic kidney disease: Secondary | ICD-10-CM | POA: Diagnosis not present

## 2020-06-15 DIAGNOSIS — J441 Chronic obstructive pulmonary disease with (acute) exacerbation: Secondary | ICD-10-CM | POA: Diagnosis not present

## 2020-06-15 DIAGNOSIS — J9621 Acute and chronic respiratory failure with hypoxia: Secondary | ICD-10-CM | POA: Diagnosis not present

## 2020-06-15 DIAGNOSIS — J9612 Chronic respiratory failure with hypercapnia: Secondary | ICD-10-CM | POA: Diagnosis not present

## 2020-06-18 DIAGNOSIS — I130 Hypertensive heart and chronic kidney disease with heart failure and stage 1 through stage 4 chronic kidney disease, or unspecified chronic kidney disease: Secondary | ICD-10-CM | POA: Diagnosis not present

## 2020-06-18 DIAGNOSIS — J9612 Chronic respiratory failure with hypercapnia: Secondary | ICD-10-CM | POA: Diagnosis not present

## 2020-06-18 DIAGNOSIS — J189 Pneumonia, unspecified organism: Secondary | ICD-10-CM | POA: Diagnosis not present

## 2020-06-18 DIAGNOSIS — J9621 Acute and chronic respiratory failure with hypoxia: Secondary | ICD-10-CM | POA: Diagnosis not present

## 2020-06-18 DIAGNOSIS — J440 Chronic obstructive pulmonary disease with acute lower respiratory infection: Secondary | ICD-10-CM | POA: Diagnosis not present

## 2020-06-18 DIAGNOSIS — J441 Chronic obstructive pulmonary disease with (acute) exacerbation: Secondary | ICD-10-CM | POA: Diagnosis not present

## 2020-06-19 ENCOUNTER — Other Ambulatory Visit: Payer: Self-pay | Admitting: Internal Medicine

## 2020-06-19 ENCOUNTER — Other Ambulatory Visit: Payer: Self-pay | Admitting: Cardiology

## 2020-06-19 ENCOUNTER — Ambulatory Visit (INDEPENDENT_AMBULATORY_CARE_PROVIDER_SITE_OTHER): Payer: Medicare Other | Admitting: Internal Medicine

## 2020-06-19 ENCOUNTER — Other Ambulatory Visit: Payer: Self-pay

## 2020-06-19 VITALS — BP 130/70 | HR 106 | Temp 98.8°F | Wt 207.5 lb

## 2020-06-19 DIAGNOSIS — I10 Essential (primary) hypertension: Secondary | ICD-10-CM | POA: Diagnosis not present

## 2020-06-19 DIAGNOSIS — J9612 Chronic respiratory failure with hypercapnia: Secondary | ICD-10-CM

## 2020-06-19 DIAGNOSIS — Z09 Encounter for follow-up examination after completed treatment for conditions other than malignant neoplasm: Secondary | ICD-10-CM | POA: Diagnosis not present

## 2020-06-19 DIAGNOSIS — E782 Mixed hyperlipidemia: Secondary | ICD-10-CM | POA: Diagnosis not present

## 2020-06-19 DIAGNOSIS — I2510 Atherosclerotic heart disease of native coronary artery without angina pectoris: Secondary | ICD-10-CM

## 2020-06-19 DIAGNOSIS — J441 Chronic obstructive pulmonary disease with (acute) exacerbation: Secondary | ICD-10-CM | POA: Diagnosis not present

## 2020-06-19 DIAGNOSIS — E039 Hypothyroidism, unspecified: Secondary | ICD-10-CM

## 2020-06-19 DIAGNOSIS — J9611 Chronic respiratory failure with hypoxia: Secondary | ICD-10-CM

## 2020-06-19 DIAGNOSIS — K219 Gastro-esophageal reflux disease without esophagitis: Secondary | ICD-10-CM

## 2020-06-19 MED ORDER — ALBUTEROL SULFATE (2.5 MG/3ML) 0.083% IN NEBU: 2.5000 mg | INHALATION_SOLUTION | Freq: Four times a day (QID) | RESPIRATORY_TRACT | 1 refills | Status: DC | PRN

## 2020-06-19 NOTE — Progress Notes (Signed)
Established Patient Office Visit     This visit occurred during the SARS-CoV-2 public health emergency.  Safety protocols were in place, including screening questions prior to the visit, additional usage of staff PPE, and extensive cleaning of exam room while observing appropriate contact time as indicated for disinfecting solutions.    CC/Reason for Visit: Hospital follow-up  HPI: [name] is a 74 y.o. female who is coming in today for the above mentioned reasons.  She was again admitted to the hospital with a COPD exacerbation from August 4 through August 6.  Hospital charts, imaging, lab results have been reviewed in detail.  Her shortness of breath had gotten so bad to the point where she was not able to walk 4 steps from her kitchen table to her trash can without excessive effort.  She called her pulmonologist office and she was given an emergency prescription for prednisone but she failed to improve despite this.  She called this office and we recommended that she go to the emergency department.  She waited 7 hours there and decided to leave.  The next day she had even more shortness of breath and again she was advised to go to the ED.  This time she was admitted and treated for COPD exacerbation.  She was found to have increased oxygen requirements, her Covid test was negative and she had no signs of pneumonia on chest x-ray.  She was treated with inhaled corticosteroids, nebulizer treatments and oral antibiotics.  She improved after 2 days and was discharged home.  Today she feels like her breathing is back to her normal.  She is back on her regular 2 L of oxygen.  She is requesting refills of her nebulized albuterol.   Past Medical/Surgical History: Past Medical History:  Diagnosis Date  . Acute bronchitis 09/25/2015  . ACUTE ON CHRONIC DIASTOLIC HEART FAILURE 12/04/2010   Qualifier: Diagnosis of  By: Ladona Ridgel, MD, [facility], [place]   . Arthritis    OA RIGHT KNEE WITH  PAIN  . Barrett esophagus   . Bradycardia 06/01/2015  . CHF (congestive heart failure) (HCC)   . Chronic respiratory failure (HCC)   . Chronic respiratory failure with hypoxia and hypercapnia (HCC) 02/04/2010   Followed in Pulmonary clinic/ [facility]/ [facility]       - 02 dependent  since 07/02/10 >>  83% RA December 05, 2010       - ONO RA 08/05/12  :  Positive sat < 89 x 2:69m> repeat on 2lpm rec 08/12/2012  - 06/17/2013 reported desat with activity p Knee surgery > rec restart 2lpm with activity  - 06/27/2013   Walked 2lpm  x one lap @ 185 stopped due to sat 88% not sob , desat to 82% on RA just at th  . COPD III spirometry if use FEV1/VC p saba  07/18/2010   Quit smoking May 2006       - PFT's  04/12/10 FEV1  1.21 (69%) ratio 77 and no change p B2,  DLC0 56%   VC 70%         - PFTs  08/08/2013 FEV1 1.21 (60%) ratio 86 and no change p B2 DLCO 79%  VC 72%  On symbicort 160 2bid  - PFT's  02/08/2018  FEV1 0.70 (40 % ) ratio 56 if use FEV1/VC  p 38 % improvement from saba p symb 160 prior to study with DLCO  78 % corrects to 147  %  for alv volume   - 02/08/2018  . Cough variant asthma 02/26/2011   Followed in Pulmonary clinic/ [facility]/ [facility]  - PFT's  06/04/15  FEV1 1.20 (67 % ) ratio 83  p 6 % improvement from saba with DLCO  80 % corrects to 132 % for alv volume      - Clinical dx based on response to symbicort       FENO 09/16/2016  =   96 on symbicort 160 2bid > added singulair  Allergy profile 09/16/2016 >  Eos 0.5 /  IgE  78 neg RAST  -  Referred to rehab 04/29/2017 > completed  . Essential hypertension 04/20/2007   Qualifier: Diagnosis of  By: Marcelyn Ditty RN, [facility] Fitch   . GERD (gastroesophageal reflux disease)   . History of ARDS 2006  . History of home oxygen therapy    AT NIGHT WHEN SLEEPING 2 L / MIN NASAL CANNULA  . Hyperlipidemia 07/12/2015  . Hypothyroidism   . Morbid (severe) obesity due to excess calories (HCC) 04/22/2015   pfts with erv 14% 06/04/15  And 33% 02/08/2018   . NSTEMI (non-ST elevated  myocardial infarction) (HCC) 05/31/2015  . Pneumococcal pneumonia (HCC) 2006   HOSPITALIZED AND DEVELOPED ARDS  . Psoriatic arthritis (HCC)   . PULMONARY FIBROSIS ILD POST INFLAMMATORY CHRONIC 07/18/2010   Followed as Primary Care Patient/  Healthcare/ [facility]  -s/p ARDS 2006 with bacteremic S  Pna       - CT chest 07/03/10 Nonspecific PF mostly upper lobes       - CT chest 12/03/10 acute gg changes and effusions c/w chf - PFT's  02/08/2018  FVC 0.64 (28 %)   with DLCO  78 % corrects to 147 % for alv volume     . Rheumatic disease   . 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 SOB (shortness of breath) on exertion   . Stroke [facility])     Past Surgical History:  Procedure Laterality Date  . ABDOMINOPLASTY    . CARDIAC CATHETERIZATION N/A 06/01/2015   Procedure: Left Heart Cath and Coronary Angiography;  Surgeon: Runell Gess, MD;  Location: [facility] INVASIVE CV LAB;  Service: Cardiovascular;  Laterality: N/A;  . CARPAL TUNNEL RELEASE    . CHOLECYSTECTOMY    . CORONARY ARTERY BYPASS GRAFT N/A 06/04/2015   Procedure: CORONARY ARTERY BYPASS GRAFT times three            with left internal mammary artery and right leg saphenous vein;  Surgeon: Alleen Borne, MD;  Location: [facility] OR;  Service: Open Heart Surgery;  Laterality: N/A;  . cosmetic breast surgery    . JOINT REPLACEMENT    . KNEE ARTHROSCOPY Left   . TEE WITHOUT CARDIOVERSION  06/04/2015   Procedure: TRANSESOPHAGEAL ECHOCARDIOGRAM (TEE);  Surgeon: Alleen Borne, MD;  Location: [facility] OR;  Service: Open Heart Surgery;;  . TOTAL KNEE ARTHROPLASTY Left   . TOTAL KNEE ARTHROPLASTY Right 06/06/2013   Procedure: RIGHT TOTAL KNEE ARTHROPLASTY;  Surgeon: Loanne Drilling, MD;  Location: [facility] ORS;  Service: Orthopedics;  Laterality: Right;    Social History:  reports that she quit smoking about 15 years ago. Her smoking use included cigarettes. She has a 87.75 pack-year smoking history. She has never used smokeless tobacco. She reports that she does not drink alcohol and does not  use drugs.  Allergies: No Known Allergies  Family History:  Family History  Problem Relation Age of Onset  . Breast cancer  Mother   . Coronary artery disease Father   . Rheum arthritis Father      Current Outpatient Medications:  .  acetaminophen (TYLENOL ARTHRITIS PAIN) 650 MG CR tablet, Take 650 mg by mouth 2 (two) times daily. , Disp: , Rfl:  .  albuterol (VENTOLIN HFA) 108 (90 Base) MCG/ACT inhaler, Inhale 2 puffs into the lungs every 4 (four) hours as needed (shortness of breath, if you can't catch your breath)., Disp: 8 g, Rfl: 5 .  aspirin 81 MG tablet, Take 1 tablet (81 mg total) by mouth daily., Disp: 30 tablet, Rfl: 0 .  atorvastatin (LIPITOR) 40 MG tablet, TAKE 1 TABLET BY MOUTH  DAILY AT 6 PM. (Patient taking differently: Take 40 mg by mouth every evening. ), Disp: 90 tablet, Rfl: 2 .  CALCIUM PO, Take 1 tablet by mouth daily., Disp: , Rfl:  .  Cholecalciferol (VITAMIN D3) 2000 UNITS TABS, Take 2,000 Int'l Units by mouth daily., Disp: , Rfl:  .  citalopram (CELEXA) 40 MG tablet, TAKE 1 TABLET BY MOUTH IN  THE MORNING (Patient taking differently: Take 40 mg by mouth daily. ), Disp: 90 tablet, Rfl: 3 .  cyclobenzaprine (FLEXERIL) 5 MG tablet, TAKE 1 TABLET BY MOUTH AT  BEDTIME AS NEEDED FOR  MUSCLE SPASM(S) (Patient taking differently: Take 5 mg by mouth at bedtime as needed for muscle spasms. ), Disp: 90 tablet, Rfl: 0 .  diltiazem (CARDIZEM CD) 120 MG 24 hr capsule, Take 1 capsule (120 mg total) by mouth at bedtime., Disp: 90 capsule, Rfl: 1 .  furosemide (LASIX) 20 MG tablet, TAKE 1 TABLET BY MOUTH  EVERY OTHER DAY (Patient taking differently: Take 20 mg by mouth every other day. ), Disp: 45 tablet, Rfl: 3 .  golimumab (SIMPONI ARIA) 50 MG/4ML SOLN injection, Inject 50 mg into the vein every 8 (eight) weeks. , Disp: , Rfl:  .  leflunomide (ARAVA) 20 MG tablet, Take 1 tablet (20 mg total) by mouth daily., Disp: , Rfl:  .  levothyroxine (SYNTHROID) 125 MCG tablet, Take 1 tablet  (125 mcg total) by mouth daily., Disp: 90 tablet, Rfl: 1 .  losartan (COZAAR) 50 MG tablet, TAKE 1 TABLET BY MOUTH  DAILY (Patient taking differently: Take 50 mg by mouth daily. ), Disp: 90 tablet, Rfl: 3 .  montelukast (SINGULAIR) 10 MG tablet, TAKE 1 TABLET BY MOUTH AT  BEDTIME, Disp: 90 tablet, Rfl: 0 .  OXYGEN, Inhale 2 L into the lungs continuous. continuous o2 , Disp: , Rfl:  .  pantoprazole (PROTONIX) 40 MG tablet, TAKE 1 TABLET BY MOUTH  DAILY BEFORE BREAKFAST (Patient taking differently: Take 40 mg by mouth daily. ), Disp: 90 tablet, Rfl: 3 .  SYMBICORT 160-4.5 MCG/ACT inhaler, USE 2 INHALATIONS BY MOUTH  TWICE DAILY (Patient taking differently: Inhale 2 puffs into the lungs in the morning and at bedtime. ), Disp: 3 Inhaler, Rfl: 6 .  Tiotropium Bromide Monohydrate (SPIRIVA RESPIMAT) 2.5 MCG/ACT AERS, 2 puffs each am (Patient taking differently: Inhale 2 puffs into the lungs daily. ), Disp: 12 g, Rfl: 3 .  traMADol (ULTRAM) 50 MG tablet, Take 1 tablet (50 mg total) by mouth 2 (two) times daily., Disp: 60 tablet, Rfl: 1 .  triamcinolone cream (KENALOG) 0.1 %, Apply 1 application topically 2 (two) times daily as needed (for psoriasis). , Disp: , Rfl:  .  albuterol (PROVENTIL) (2.5 MG/3ML) 0.083% nebulizer solution, Take 3 mLs (2.5 mg total) by nebulization every 6 (six) hours as needed for  wheezing or shortness of breath., Disp: 150 mL, Rfl: 1  Review of Systems:  Constitutional: Denies fever, chills, diaphoresis, appetite change and fatigue.  HEENT: Denies photophobia, eye pain, redness, hearing loss, ear pain, congestion, sore throat, rhinorrhea, sneezing, mouth sores, trouble swallowing, neck pain, neck stiffness and tinnitus.   Respiratory: Denies  cough, chest tightness,  and wheezing.   Cardiovascular: Denies chest pain, palpitations and leg swelling.  Gastrointestinal: Denies nausea, vomiting, abdominal pain, diarrhea, constipation, blood in stool and abdominal distention.    Genitourinary: Denies dysuria, urgency, frequency, hematuria, flank pain and difficulty urinating.  Endocrine: Denies: hot or cold intolerance, sweats, changes in hair or nails, polyuria, polydipsia. Musculoskeletal: Denies myalgias, back pain, joint swelling, arthralgias and gait problem.  Skin: Denies pallor, rash and wound.  Neurological: Denies dizziness, seizures, syncope, weakness, light-headedness, numbness and headaches.  Hematological: Denies adenopathy. Easy bruising, personal or family bleeding history  Psychiatric/Behavioral: Denies suicidal ideation, mood changes, confusion, nervousness, sleep disturbance and agitation    Physical Exam: Vitals:   06/19/20 1416  BP: 130/70  Pulse: (!) 106  Temp: 98.8 F (37.1 C)  TempSrc: Oral  SpO2: 92%  Weight: 207 lb 8 oz (94.1 kg)    Body mass index is 41.91 kg/m.   Constitutional: NAD, calm, comfortable Eyes: PERRL, lids and conjunctivae normal ENMT: Mucous membranes are moist.  Respiratory: clear to auscultation bilaterally, no wheezing, no crackles. Normal respiratory effort. No accessory muscle use.  Cardiovascular: Regular rate and rhythm, no murmurs / rubs / gallops. No extremity edema.  Neurologic: Grossly intact and nonfocal Psychiatric: Normal judgment and insight. Alert and oriented x 3. Normal mood.    Impression and Plan:  Hospital discharge follow-up COPD exacerbation (HCC)  Chronic respiratory failure with hypoxia and hypercapnia (HCC) Hypothyroidism, unspecified type Essential hypertension Gastroesophageal reflux disease, unspecified whether esophagitis present Morbid (severe) obesity due to excess calories (HCC) Mixed hyperlipidemia  -She is doing much better post hospital discharge. -Send refills of nebulized albuterol, she agrees to notify us much sooner next time she runs into breathing difficulties.  -She will return for physical in 3 months.    Chaya Jan, MD [facility]

## 2020-06-21 DIAGNOSIS — J441 Chronic obstructive pulmonary disease with (acute) exacerbation: Secondary | ICD-10-CM | POA: Diagnosis not present

## 2020-06-21 DIAGNOSIS — J440 Chronic obstructive pulmonary disease with acute lower respiratory infection: Secondary | ICD-10-CM | POA: Diagnosis not present

## 2020-06-21 DIAGNOSIS — J189 Pneumonia, unspecified organism: Secondary | ICD-10-CM | POA: Diagnosis not present

## 2020-06-21 DIAGNOSIS — J9612 Chronic respiratory failure with hypercapnia: Secondary | ICD-10-CM | POA: Diagnosis not present

## 2020-06-21 DIAGNOSIS — I130 Hypertensive heart and chronic kidney disease with heart failure and stage 1 through stage 4 chronic kidney disease, or unspecified chronic kidney disease: Secondary | ICD-10-CM | POA: Diagnosis not present

## 2020-06-21 DIAGNOSIS — J9621 Acute and chronic respiratory failure with hypoxia: Secondary | ICD-10-CM | POA: Diagnosis not present

## 2020-06-27 DIAGNOSIS — J9621 Acute and chronic respiratory failure with hypoxia: Secondary | ICD-10-CM | POA: Diagnosis not present

## 2020-06-27 DIAGNOSIS — J441 Chronic obstructive pulmonary disease with (acute) exacerbation: Secondary | ICD-10-CM | POA: Diagnosis not present

## 2020-06-27 DIAGNOSIS — J189 Pneumonia, unspecified organism: Secondary | ICD-10-CM | POA: Diagnosis not present

## 2020-06-27 DIAGNOSIS — J440 Chronic obstructive pulmonary disease with acute lower respiratory infection: Secondary | ICD-10-CM | POA: Diagnosis not present

## 2020-06-27 DIAGNOSIS — J9612 Chronic respiratory failure with hypercapnia: Secondary | ICD-10-CM | POA: Diagnosis not present

## 2020-06-27 DIAGNOSIS — I130 Hypertensive heart and chronic kidney disease with heart failure and stage 1 through stage 4 chronic kidney disease, or unspecified chronic kidney disease: Secondary | ICD-10-CM | POA: Diagnosis not present

## 2020-06-29 DIAGNOSIS — I130 Hypertensive heart and chronic kidney disease with heart failure and stage 1 through stage 4 chronic kidney disease, or unspecified chronic kidney disease: Secondary | ICD-10-CM | POA: Diagnosis not present

## 2020-06-29 DIAGNOSIS — J189 Pneumonia, unspecified organism: Secondary | ICD-10-CM | POA: Diagnosis not present

## 2020-06-29 DIAGNOSIS — J9612 Chronic respiratory failure with hypercapnia: Secondary | ICD-10-CM | POA: Diagnosis not present

## 2020-06-29 DIAGNOSIS — J441 Chronic obstructive pulmonary disease with (acute) exacerbation: Secondary | ICD-10-CM | POA: Diagnosis not present

## 2020-06-29 DIAGNOSIS — J440 Chronic obstructive pulmonary disease with acute lower respiratory infection: Secondary | ICD-10-CM | POA: Diagnosis not present

## 2020-06-29 DIAGNOSIS — J9621 Acute and chronic respiratory failure with hypoxia: Secondary | ICD-10-CM | POA: Diagnosis not present

## 2020-07-02 ENCOUNTER — Encounter: Payer: Self-pay | Admitting: Internal Medicine

## 2020-07-02 ENCOUNTER — Other Ambulatory Visit: Payer: Self-pay

## 2020-07-02 ENCOUNTER — Ambulatory Visit (INDEPENDENT_AMBULATORY_CARE_PROVIDER_SITE_OTHER): Payer: Medicare Other | Admitting: Internal Medicine

## 2020-07-02 VITALS — BP 134/70 | HR 81 | Temp 98.2°F | Ht 59.0 in | Wt 208.2 lb

## 2020-07-02 DIAGNOSIS — I2510 Atherosclerotic heart disease of native coronary artery without angina pectoris: Secondary | ICD-10-CM

## 2020-07-02 DIAGNOSIS — J449 Chronic obstructive pulmonary disease, unspecified: Secondary | ICD-10-CM

## 2020-07-02 DIAGNOSIS — J9612 Chronic respiratory failure with hypercapnia: Secondary | ICD-10-CM | POA: Diagnosis not present

## 2020-07-02 DIAGNOSIS — J9611 Chronic respiratory failure with hypoxia: Secondary | ICD-10-CM

## 2020-07-02 MED ORDER — BUDESONIDE 0.25 MG/2ML IN SUSP: RESPIRATORY_TRACT | 12 refills | Status: DC

## 2020-07-02 MED ORDER — FORMOTEROL FUMARATE 20 MCG/2ML IN NEBU: 20.0000 ug | INHALATION_SOLUTION | Freq: Two times a day (BID) | RESPIRATORY_TRACT | 11 refills | Status: DC

## 2020-07-02 NOTE — Progress Notes (Signed)
Subjective:   Patient ID: [name], female    DOB: Mar 17, 1946    MRN: [id number]   Brief patient profile:  21  yowf  quit smoking in May 2006 with dx pna/ ards resumed full activity including yardwork at wt 140 and pft's c/w restrictive changes 04/2010 at wt 170 but reported improvement on saba so maintained chronically on symbicort 160  With pfts 02/08/18  c/w GOLD III copd if use FEV1/VC ratio    History of Present Illness  July 18, 2010  1st pulmonary office eval in ER era c/o doe x 6 months progressive indolent onset with copd vs pf.  Ultimately required hosp 8/30-9/1 dx copd/pf ? cause on sulfasalzine and ACE inhib better on 02 and advair but very hoarse with sense of chest congestion and cough with white mucus day > night.  doe x > slow adls.stop advair Start symbicort 160 2 puffs first thing  in am and 2 puffs again in pm about 12 hours later  Work on inhaler technique:   stop benazapril start benicar 40/25  one daily Use 02 sleeping and any activity other than sitting still   04/10/2014 f/u ov/[name] re: restrictive lung dz/ RA/psoriatric/ symbicort 160 2bid  Chief Complaint  Patient presents with  . Follow-up    Pt states was advised to f/u per [employer].  Pt has been having increased SOB- relates to stress from dealing with her spouse's illness. She is using o2 pretty much 24/7.   trying to do more than baseline activity and finds she needs 02 more. rec Work on inhaler technique:   Ok to just use the symbicort 160 2 puffs in am to see what difference if any this makes in your breathing  Change 02 to 2lpm 24/7 and use 3lpm with activity    06/04/15  cabg      11/10/2017  f/u ov/[name] re: AB / pf related to ALI vs RA / MO  Chief Complaint  Patient presents with  . Follow-up    Breathing has been worse over the past 1-2 months. She states she gets winded just walking from her house to her car. She has been using her albuterol inhaler 4 x daily on average.   on 2lpm at rest and  sleeping needed up to 4lpm continous while doing rehab  but only has pulsed to 3lpm at home per [employer] Much better only while on prednisone / poor hfa noted  Doe = MMRC3 = can't walk 100 yards even at a slow pace at a flat grade s stopping due to sob  Even on 02 but not sure about sats as does not check rec Goal is to keep sats above 90%  - call if you want [employer] to re-evaluate your ambulatory needs  Prednisone 10 mg take  4 each am x 2 days,   2 each am x 2 days,  1 each am x 2 days and stop  Work on inhaler technique:      04/05/18  aecopd > pred x 6 days > back to baseline      10/11/2018  f/u ov/[name] re:  COPD GOLD III/ 02 dep / maint on just symb 160 2bid (no better with incruse) Chief Complaint  Patient presents with  . Follow-up    Breathing is the same. She is using her albuterol inhaler once per wk on average.   Dyspnea:  HT, sometimes [employer] on 3lpm MMRC3 = can't walk 100 yards even at a slow  pace at a flat grade s stopping due to sob   Cough: none Sleeping: on back books under hob plus pillows = 30 degrees  SABA use: rare  02: 2lpm but turns to 3lpm poc with activity    rec Prednisone 10 mg take  4 each am x 2 days,   2 each am x 2 days,  1 each am x 2 days and stop  Work on inhaler technique:   Please schedule a follow up visit in 4 months but call sooner if needed  - add consider adding spiriva next ov if not doing better and insurance will cover   04/18/2019  f/u ov/[name] re: GOLD III spirometry but 02 dep/ maint on symb 160 2bid/spiriva  Chief Complaint  Patient presents with  . Follow-up    Breathing not improving since the last visit. She states she has not been active at all. She has been using her albuterol daily.   Dyspnea:  No longer shopping due to covid 19 does delivery only  Cough: occ spells/ non productive never noct   Sleeping: on elevated to 30 degrees  SABA use: daily use of nebulizer  02: 2lpm but 3lpm with activity  rec Plan A = Automatic =Symbicort  160 x 2 pffs each am followed by spriiva 2 puffs and 12 hours later symbicort 160 x 2  Work on inhaler technique: Plan B = Backup Only use your albuterol(proventil) inhaler as a rescue medication  Plan C = Crisis - only use your albuterol nebulizer if you first try Plan B and it fails to help > ok to use the nebulizer up to every 4 hours but if start needing it regularly call for immediate appointment    06/14/2019  f/u ov/[name] re:  GOLD III spirometry but 02 dep 24/7/ maint on symb 160/spiriva [name] Chief Complaint  Patient presents with  . Follow-up    Breathing is overall doing about the same. She is using her albuterol once per wk on average.   Dyspnea:  room to room now on up to 3lpm with sats ok but feels losing ground steadily since prior ov in terms of activity tol/ housebound mostly at this poit   Cough: some spells daytime, not noct, mostly mucoid production Sleeping: 30 degrees elevation helps some SABA use: rarely hfa/ never neb at this point  02: 2lpm bedtime up to 3lpm with activity  rec Prednisone 10 mg Take 4 for three days 3 for three days 2 for three days 1 for three days and stop  No change in medications    07/15/2019  f/u ov/[name] re: copd  GOLD III spirometry / 02 dep maint on symb/spiriva/singulair  Chief Complaint  Patient presents with  . COPD III spirometry if use FEV1/VC p saba    Doing better since last visit with the combination of Symbicort and Spiriva   Dyspnea:  Up to 5 min on street x 3lpm = MMRC3 = can't walk 100 yards even at a slow pace at a flat grade s stopping due to sob  But not checking sats at peak ex  Cough: not now  Sleeping: 30 degrees hob / no am flares of cough/ wheeze/sob   SABA use: none 02: 2lpm hs, 2lpm resting, 3lpm vacuuming or walking outside 3lpm POC  rec Work on inhaler technique:    No change in medications or 02     11/08/2019  f/u ov/[name] re: GOLD III spirom but 02 dep  Chief Complaint  Patient presents with  .  Follow-up    Pt states shes been feeling about the same. Not as active. Coughs a little bit with "yellowish tan" phlegm. Denies any fever or chills.   Dyspnea: MMRC3 = can't walk 100 yards even at a slow pace at a flat grade s stopping due to sob   Cough: min rattling  Sleeping: 30 degrees hob  SABA use: much less  02: 2lpm hs, 2lpm resting, 3lpm vacuuming or walking outside 3lpm POC  rec Plan A = Automatic =Symbicort 160 x 2 pffs each am followed by spriiva 2 puffs and 12 hours later symbicort 160 x 2  Work on inhaler technique  Plan B = Backup Only use your albuterol(proventil) inhaler as a rescue medication  Plan C = Crisis - only use your albuterol nebulizer if you first try Plan B and it fails to help > ok to use the nebulizer up to every 4 hours but if start needing it regularly call for immediate appointment Plan D = Deltasone  If plan C not working well, add Prednisone 10 mg take  4 each am x 2 days,   2 each am x 2 days,  1 each am x 2 days and stop    07/02/2020  f/u ov/[name] re: GOLD III/ 02 dep maint on symb/spriva and freq pred  Chief Complaint  Patient presents with  . Follow-up    Gets extremely sob with activity  Dyspnea:  Across the room baseline / down the drive today did  better flat grade/ always better p prednisone   Cough: none  Sleeping: electric bed  30 degrees  SABA use: rarely  02:  3lpm 24/7   No obvious day to day or daytime variability or assoc excess/ purulent sputum or mucus plugs or hemoptysis or cp or chest tightness, subjective wheeze or overt sinus or hb symptoms.   Sleeping as above  without nocturnal  or early am exacerbation  of respiratory  c/o's or need for noct saba. Also denies any obvious fluctuation of symptoms with weather or environmental changes or other aggravating or alleviating factors except as outlined above   No unusual exposure hx or h/o childhood pna/ asthma or knowledge of premature birth.  Current Allergies, Complete Past  Medical History, Past Surgical History, Family History, and Social History were reviewed in [employer] record.  ROS  The following are not active complaints unless bolded Hoarseness, sore throat, dysphagia, dental problems, itching, sneezing,  nasal congestion or discharge of excess mucus or purulent secretions, ear ache,   fever, chills, sweats, unintended wt loss or wt gain, classically pleuritic or exertional cp,  orthopnea pnd or arm/hand swelling  or leg swelling, presyncope, palpitations, abdominal pain, anorexia, nausea, vomiting, diarrhea  or change in bowel habits or change in bladder habits, change in stools or change in urine, dysuria, hematuria,  rash, arthralgias, visual complaints, headache, numbness, weakness or ataxia or problems with walking or coordination,  change in mood or  memory.        Current Meds  Medication Sig  . acetaminophen (TYLENOL ARTHRITIS PAIN) 650 MG CR tablet Take 650 mg by mouth 2 (two) times daily.   Marland Kitchen albuterol (PROVENTIL) (2.5 MG/3ML) 0.083% nebulizer solution Take 3 mLs (2.5 mg total) by nebulization every 6 (six) hours as needed for wheezing or shortness of breath.  Marland Kitchen albuterol (VENTOLIN HFA) 108 (90 Base) MCG/ACT inhaler Inhale 2 puffs into the lungs every 4 (four) hours as needed (  shortness of breath, if you can't catch your breath).  Marland Kitchen aspirin 81 MG tablet Take 1 tablet (81 mg total) by mouth daily.  Marland Kitchen atorvastatin (LIPITOR) 40 MG tablet TAKE 1 TABLET BY MOUTH  DAILY AT 6 PM. (Patient taking differently: Take 40 mg by mouth every evening. )  . CALCIUM PO Take 1 tablet by mouth daily.  . Cholecalciferol (VITAMIN D3) 2000 UNITS TABS Take 2,000 Int'l Units by mouth daily.  . citalopram (CELEXA) 40 MG tablet TAKE 1 TABLET BY MOUTH IN  THE MORNING (Patient taking differently: Take 40 mg by mouth daily. )  . cyclobenzaprine (FLEXERIL) 5 MG tablet TAKE 1 TABLET BY MOUTH AT  BEDTIME AS NEEDED FOR  MUSCLE SPASM(S) (Patient taking  differently: Take 5 mg by mouth at bedtime as needed for muscle spasms. )  . diltiazem (CARDIZEM CD) 120 MG 24 hr capsule TAKE 1 CAPSULE (120 MG TOTAL) BY MOUTH AT BEDTIME.  . furosemide (LASIX) 20 MG tablet TAKE 1 TABLET BY MOUTH  EVERY OTHER DAY (Patient taking differently: Take 20 mg by mouth every other day. )  . golimumab (SIMPONI ARIA) 50 MG/4ML SOLN injection Inject 50 mg into the vein every 8 (eight) weeks.   Marland Kitchen leflunomide (ARAVA) 20 MG tablet Take 1 tablet (20 mg total) by mouth daily.  Marland Kitchen levothyroxine (SYNTHROID) 125 MCG tablet TAKE 1 TABLET BY MOUTH EVERY DAY  . losartan (COZAAR) 50 MG tablet TAKE 1 TABLET BY MOUTH  DAILY (Patient taking differently: Take 50 mg by mouth daily. )  . montelukast (SINGULAIR) 10 MG tablet TAKE 1 TABLET BY MOUTH AT  BEDTIME  . OXYGEN Inhale 2 L into the lungs continuous. continuous o2   . pantoprazole (PROTONIX) 40 MG tablet TAKE 1 TABLET BY MOUTH  DAILY BEFORE BREAKFAST (Patient taking differently: Take 40 mg by mouth daily. )  . SYMBICORT 160-4.5 MCG/ACT inhaler USE 2 INHALATIONS BY MOUTH  TWICE DAILY (Patient taking differently: Inhale 2 puffs into the lungs in the morning and at bedtime. )  . Tiotropium Bromide Monohydrate (SPIRIVA RESPIMAT) 2.5 MCG/ACT AERS 2 puffs each am (Patient taking differently: Inhale 2 puffs into the lungs daily. )  . traMADol (ULTRAM) 50 MG tablet Take 1 tablet (50 mg total) by mouth 2 (two) times daily.  Marland Kitchen triamcinolone cream (KENALOG) 0.1 % Apply 1 application topically 2 (two) times daily as needed (for psoriasis).                 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       - CT chest 07/03/10 Nonspecific PF mostly upper lobes       - CT chest 12/03/10 acute gg changes and effusions c/w chf        - PFT's  04/12/10 FEV1  1.21 (69%) ratio 77 and no change p B2,  DLC0 56% Asthmatic bronchitis     - HFA 50% p coaching July 18, 2010 >>  90%  02/25/2011  history of mild vitamin D deficiency        Objective:   Physical Exam     07/02/2020   208  11/08/2019     204   wt 192 July 18, 2010 > 197 August 21, 2010 > 207 December 05, 2010  > 183  01/21/2011  > 179 02/25/2011 >189 08/10/2012 >195 09/08/12 >200 12/27/12 > 202 05/03/2013 > 06/28/2013  199 > 08/08/13 194 > 04/10/2014  203 > 04/19/2015   176 >  07/23/2015 166 > 09/16/2016  210 >  10/29/2016  209 > 04/29/2017    206 > 11/10/2017   209 > 02/08/2018  207 > 03/24/2018 209> 04/23/2018  208> 10/11/2018   208  > 04/18/2019  203 > 06/14/2019   208 > 07/15/2019   205   Pleasant amb wm arrived in w/c but walked into office from hallway  Vital signs reviewed  07/02/2020  - Note at rest 02 sats  97% on 3lpm POC      HEENT : pt wearing mask not removed for exam due to covid - 19 concerns.    NECK :  without JVD/Nodes/TM/ nl carotid upstrokes bilaterally   LUNGS: no acc muscle use,  Mild barrel  contour chest wall with bilateral  Distant mid exp   wheeze and  without cough on insp or exp maneuvers  and mild  Hyperresonant  to  percussion bilaterally     CV:  RRR  no s3 or murmur or increase in P2, and no edema   ABD:  soft and nontender with pos end  insp Hoover's  in the supine position. No bruits or organomegaly appreciated, bowel sounds nl  MS:   Nl gait/  ext warm without deformities, calf tenderness, cyanosis or clubbing No obvious joint restrictions   SKIN: warm and dry without lesions    NEURO:  alert, approp, nl sensorium with  no motor or cerebellar deficits apparent.            I personally reviewed images and agree with radiology impression as follows:  CXR:    06/06/20 1. Mild, stable  left basilar atelectasis and/or infiltrate. 2. Stable diffusely increased interstitial lung markings which are likely chronic in nature.

## 2020-07-02 NOTE — Patient Instructions (Addendum)
Plan A = Automatic =  spriiva 2 puffs each am (work on practice inhalations Instead of symbicort =  Budesonide and formoterol first thing in am and 12 hours later  in nebulizer   Plan B = Backup Only use your albuterol(proventil) inhaler as a rescue medication to be used if you can't catch your breath by resting or doing a relaxed purse lip breathing pattern.  - The less you use it, the better it will work when you need it. - Ok to use the inhaler up to 2 puffs  every 4 hours if you must but call for appointment if use goes up over your usual need - Don't leave home without it !!  (think of it like the spare tire for your car)   Plan C = Crisis - only use your albuterol nebulizer if you first try Plan B and it fails to help > ok to use the nebulizer up to every 4 hours but if start needing it regularly call for immediate appointment  Plan D = Deltasone  If plan C not working well, add Prednisone 10 mg take  4 each am x 2 days,   2 each am x 2 days,  1 each am x 2 days and stop   Please schedule a follow up visit in 6 months but call sooner if needed

## 2020-07-03 ENCOUNTER — Encounter: Payer: Self-pay | Admitting: Internal Medicine

## 2020-07-03 DIAGNOSIS — J441 Chronic obstructive pulmonary disease with (acute) exacerbation: Secondary | ICD-10-CM | POA: Diagnosis not present

## 2020-07-03 DIAGNOSIS — J440 Chronic obstructive pulmonary disease with acute lower respiratory infection: Secondary | ICD-10-CM | POA: Diagnosis not present

## 2020-07-03 DIAGNOSIS — J189 Pneumonia, unspecified organism: Secondary | ICD-10-CM | POA: Diagnosis not present

## 2020-07-03 DIAGNOSIS — I130 Hypertensive heart and chronic kidney disease with heart failure and stage 1 through stage 4 chronic kidney disease, or unspecified chronic kidney disease: Secondary | ICD-10-CM | POA: Diagnosis not present

## 2020-07-03 DIAGNOSIS — J9621 Acute and chronic respiratory failure with hypoxia: Secondary | ICD-10-CM | POA: Diagnosis not present

## 2020-07-03 DIAGNOSIS — J9612 Chronic respiratory failure with hypercapnia: Secondary | ICD-10-CM | POA: Diagnosis not present

## 2020-07-03 NOTE — Assessment & Plan Note (Signed)
02 dependent  since 07/02/10 >>  83% RA December 05, 2010       - ONO RA 08/05/12  :  Positive sat < 89 x 2:62m> repeat on 2lpm rec 08/12/2012  - 06/17/2013 reported desat with activity p Knee surgery > rec restart 2lpm with activity  - 06/27/2013   Walked 2lpm  x one lap @ 185 stopped due to sat 88% not sob , desat to 82% on RA just at the  Start of the walk  - 04/10/14  Sats  81% RA and then  Walked 3lpm x  2 laps @ 185 ft each stopped due to end of study, no desat  - HCO3  11/09/17     = 31 - HCO3  08/27/18 = 37   - HCO3  03/11/2019 = 33    Adequate control on present rx, reviewed in detail with pt > no change in rx needed           Each maintenance medication was reviewed in detail including emphasizing most importantly the difference between maintenance and prns and under what circumstances the prns are to be triggered using an action plan format where appropriate.  Total time for H and P, chart review, counseling, teaching device and generating customized AVS unique to this office visit / charting = 20 min

## 2020-07-03 NOTE — Assessment & Plan Note (Signed)
Quit smoking May 2006       - PFT's  04/12/10 FEV1  1.21 (69%) ratio 77 and no change p B2,  DLC0 56%   VC 70%         - PFTs  08/08/2013 FEV1 1.21 (60%) ratio 86 and no change p B2 DLCO 79%  VC 72%  On symbicort 160 2bid  - PFT's  02/08/2018  FEV1 0.70 (40 % ) ratio 56 if use FEV1/VC  p 38 % improvement from saba p symb 160 prior to study with DLCO  78 % corrects to 147  % for alv volume   - 02/08/2018  After extensive coaching inhaler device  effectiveness =    75% with mdi and 90% with dpi so change to Trelegy > no change from sample so rec resume symb 160 2 bid as of 03/24/2018  - 04/18/2019    changed spiriva to smi  11/08/2019  After extensive coaching inhaler device,  effectiveness =    75% (short Ti) > continue symb / spiriva  - 11/08/2019 added Plan D = Deltasone x 6 days for flares  - 07/02/2020 change symbicort to  Budesonide and formoterol and continue spriva    Group D in terms of symptom/risk and laba/lama/ICS  therefore appropriate rx at this point >>>  Bud/performist plus spiriva for now and if affordable change the latter to yupelri as she really struggles with short Ti to get adequate delivery of meds to target tissue esp when air trapping and req too many pred cycles resulting in wt gain which will further hamper insp volumes   Discussed in detail all the  indications, usual  risks and alternatives  relative to the benefits with patient who agrees to proceed with Rx as outlined.

## 2020-07-12 DIAGNOSIS — J9612 Chronic respiratory failure with hypercapnia: Secondary | ICD-10-CM | POA: Diagnosis not present

## 2020-07-12 DIAGNOSIS — J9621 Acute and chronic respiratory failure with hypoxia: Secondary | ICD-10-CM | POA: Diagnosis not present

## 2020-07-12 DIAGNOSIS — J441 Chronic obstructive pulmonary disease with (acute) exacerbation: Secondary | ICD-10-CM | POA: Diagnosis not present

## 2020-07-12 DIAGNOSIS — J440 Chronic obstructive pulmonary disease with acute lower respiratory infection: Secondary | ICD-10-CM | POA: Diagnosis not present

## 2020-07-12 DIAGNOSIS — I130 Hypertensive heart and chronic kidney disease with heart failure and stage 1 through stage 4 chronic kidney disease, or unspecified chronic kidney disease: Secondary | ICD-10-CM | POA: Diagnosis not present

## 2020-07-12 DIAGNOSIS — J189 Pneumonia, unspecified organism: Secondary | ICD-10-CM | POA: Diagnosis not present

## 2020-07-13 DIAGNOSIS — Z9181 History of falling: Secondary | ICD-10-CM | POA: Diagnosis not present

## 2020-07-13 DIAGNOSIS — D631 Anemia in chronic kidney disease: Secondary | ICD-10-CM | POA: Diagnosis not present

## 2020-07-13 DIAGNOSIS — S22000D Wedge compression fracture of unspecified thoracic vertebra, subsequent encounter for fracture with routine healing: Secondary | ICD-10-CM | POA: Diagnosis not present

## 2020-07-13 DIAGNOSIS — M1710 Unilateral primary osteoarthritis, unspecified knee: Secondary | ICD-10-CM | POA: Diagnosis not present

## 2020-07-13 DIAGNOSIS — J8410 Pulmonary fibrosis, unspecified: Secondary | ICD-10-CM | POA: Diagnosis not present

## 2020-07-13 DIAGNOSIS — J9621 Acute and chronic respiratory failure with hypoxia: Secondary | ICD-10-CM | POA: Diagnosis not present

## 2020-07-13 DIAGNOSIS — I5033 Acute on chronic diastolic (congestive) heart failure: Secondary | ICD-10-CM | POA: Diagnosis not present

## 2020-07-13 DIAGNOSIS — K2270 Barrett's esophagus without dysplasia: Secondary | ICD-10-CM | POA: Diagnosis not present

## 2020-07-13 DIAGNOSIS — N1831 Chronic kidney disease, stage 3a: Secondary | ICD-10-CM | POA: Diagnosis not present

## 2020-07-13 DIAGNOSIS — Z6829 Body mass index (BMI) 29.0-29.9, adult: Secondary | ICD-10-CM | POA: Diagnosis not present

## 2020-07-13 DIAGNOSIS — J189 Pneumonia, unspecified organism: Secondary | ICD-10-CM | POA: Diagnosis not present

## 2020-07-13 DIAGNOSIS — Z951 Presence of aortocoronary bypass graft: Secondary | ICD-10-CM | POA: Diagnosis not present

## 2020-07-13 DIAGNOSIS — E039 Hypothyroidism, unspecified: Secondary | ICD-10-CM | POA: Diagnosis not present

## 2020-07-13 DIAGNOSIS — J440 Chronic obstructive pulmonary disease with acute lower respiratory infection: Secondary | ICD-10-CM | POA: Diagnosis not present

## 2020-07-13 DIAGNOSIS — J441 Chronic obstructive pulmonary disease with (acute) exacerbation: Secondary | ICD-10-CM | POA: Diagnosis not present

## 2020-07-13 DIAGNOSIS — F332 Major depressive disorder, recurrent severe without psychotic features: Secondary | ICD-10-CM | POA: Diagnosis not present

## 2020-07-13 DIAGNOSIS — Z87891 Personal history of nicotine dependence: Secondary | ICD-10-CM | POA: Diagnosis not present

## 2020-07-13 DIAGNOSIS — K219 Gastro-esophageal reflux disease without esophagitis: Secondary | ICD-10-CM | POA: Diagnosis not present

## 2020-07-13 DIAGNOSIS — L4050 Arthropathic psoriasis, unspecified: Secondary | ICD-10-CM | POA: Diagnosis not present

## 2020-07-13 DIAGNOSIS — I252 Old myocardial infarction: Secondary | ICD-10-CM | POA: Diagnosis not present

## 2020-07-13 DIAGNOSIS — I130 Hypertensive heart and chronic kidney disease with heart failure and stage 1 through stage 4 chronic kidney disease, or unspecified chronic kidney disease: Secondary | ICD-10-CM | POA: Diagnosis not present

## 2020-07-13 DIAGNOSIS — J9612 Chronic respiratory failure with hypercapnia: Secondary | ICD-10-CM | POA: Diagnosis not present

## 2020-07-16 DIAGNOSIS — J9612 Chronic respiratory failure with hypercapnia: Secondary | ICD-10-CM | POA: Diagnosis not present

## 2020-07-16 DIAGNOSIS — I130 Hypertensive heart and chronic kidney disease with heart failure and stage 1 through stage 4 chronic kidney disease, or unspecified chronic kidney disease: Secondary | ICD-10-CM | POA: Diagnosis not present

## 2020-07-16 DIAGNOSIS — J9621 Acute and chronic respiratory failure with hypoxia: Secondary | ICD-10-CM | POA: Diagnosis not present

## 2020-07-16 DIAGNOSIS — J441 Chronic obstructive pulmonary disease with (acute) exacerbation: Secondary | ICD-10-CM | POA: Diagnosis not present

## 2020-07-16 DIAGNOSIS — J189 Pneumonia, unspecified organism: Secondary | ICD-10-CM | POA: Diagnosis not present

## 2020-07-16 DIAGNOSIS — J440 Chronic obstructive pulmonary disease with acute lower respiratory infection: Secondary | ICD-10-CM | POA: Diagnosis not present

## 2020-07-23 ENCOUNTER — Telehealth: Payer: Self-pay | Admitting: Pharmacist

## 2020-07-23 ENCOUNTER — Telehealth: Payer: Self-pay | Admitting: Internal Medicine

## 2020-07-23 DIAGNOSIS — J9621 Acute and chronic respiratory failure with hypoxia: Secondary | ICD-10-CM | POA: Diagnosis not present

## 2020-07-23 DIAGNOSIS — I130 Hypertensive heart and chronic kidney disease with heart failure and stage 1 through stage 4 chronic kidney disease, or unspecified chronic kidney disease: Secondary | ICD-10-CM | POA: Diagnosis not present

## 2020-07-23 DIAGNOSIS — J441 Chronic obstructive pulmonary disease with (acute) exacerbation: Secondary | ICD-10-CM | POA: Diagnosis not present

## 2020-07-23 DIAGNOSIS — J440 Chronic obstructive pulmonary disease with acute lower respiratory infection: Secondary | ICD-10-CM | POA: Diagnosis not present

## 2020-07-23 DIAGNOSIS — J189 Pneumonia, unspecified organism: Secondary | ICD-10-CM | POA: Diagnosis not present

## 2020-07-23 DIAGNOSIS — J9612 Chronic respiratory failure with hypercapnia: Secondary | ICD-10-CM | POA: Diagnosis not present

## 2020-07-23 NOTE — Telephone Encounter (Signed)
Pt wants to know what type of Dr. To get a referral to for sciatica pain? The pain is getting worse and she wants to see one  Please advise

## 2020-07-23 NOTE — Progress Notes (Signed)
I left the patient a message about her upcoming appointment on 07/24/2020 @ 12:00 pm with the clinical pharmacist. She was asked to please have all medication on had to review the pharmacist.

## 2020-07-24 ENCOUNTER — Ambulatory Visit: Payer: Medicare Other

## 2020-07-24 DIAGNOSIS — M545 Low back pain: Secondary | ICD-10-CM | POA: Diagnosis not present

## 2020-07-24 NOTE — Patient Instructions (Signed)
Hi!   It was great getting to speak to you on the phone today! As discussed, don't forget to speak with Dr. Ardyth Harps about stopping the citalopram. Continue monitoring your blood pressure at home work on some of the diet changes we discussed to lower your bad cholesterol.   Don't hesitate to reach out to me if you need anything!  Thanks, Maddie  Gaylord Shih, PharmD Clinical Pharmacist [facility] [place] [phone]    Visit Information  Goals Addressed            This Visit's Progress   . Pharmacy Care Plan       CARE PLAN ENTRY  Current Barriers:  . Chronic Disease Management support, education, and care coordination needs related to Hypertension, Hyperlipidemia, Heart Failure, COPD, Hypothyroidism, and Depression   Hypertension . Pharmacist Clinical Goal(s): o Over the next 90 days, patient will work with PharmD and providers to maintain BP goal <130/80 . Current regimen:   Losartan 50mg ,1 tablet once daily   Diltiazem (Cardizem CD) 120mg , 1 capsule at bedtime  . Interventions: . Discussed diet modifications. DASH diet:  following a diet emphasizing fruits and vegetables and low-fat dairy products along with whole grains, fish, poultry, and nuts. Reducing red meats and sugars.  . Patient self care activities - Over the next 90 days, patient will: o Check blood pressure weekly, document, and provide at future appointments o Ensure daily salt intake < 1500 mg/day  Hyperlipidemia . Pharmacist Clinical Goal(s): o Over the next 90 days, patient will work with PharmD and providers to achieve LDL goal < 70 . Current regimen:  o Atorvastatin 40mg , 1 tablet once daily at 6 pm Interventions: Discussed diet modifications such as incorporating more fiber in your diet to help lower bad cholesterol . Patient self care activities - Over the next 90 days, patient will: o Continue current medications o Continue making diet changes such as adding more fiber to  your diet to lower bad cholesterol  COPD . Pharmacist Clinical Goal(s) o Over the next 90 days, patient will work with PharmD and providers to Prevent worsening of shortness of breath and hospitalizations. . Current regimen:   Albuterol (Ventolin HFA) 165mcg/ act inhaler, inhale 2 puffs every four hours as needed for shortness of breath  Montelukast 10mg , 1 tablet at bedtime   Budesonide 0.25 mg/84mL nebulizer, inhale twice daily  Formoterol 20 mcg/mL, inhale 2 mLs twice daily  Tiotropium (Spiriva Respimat) 2.5 mcg/ act, inhale 2 puffs every morning  . Patient self care activities - Over the next 90 days, patient will: o Continue current medications and continue follow up visits with Dr. (pulmonologist).   Depression . Pharmacist Clinical Goal(s) o Over the next 90 days, patient will work with PharmD and providers to Improve mood/ depression symptoms . Current regimen:   Citalopram 40mg , 1 tablet in the morning  . Interventions: o Discussed taper plan to discontinue citalopram.  . Patient self care activities o Patient will continue current medications and discuss further with Dr. 100m at next office visit  Hypothyroidism . Pharmacist Clinical Goal(s) o Over the next 90 days, patient will work with PharmD and providers to maintain TSH: 0.35 to 4.50 uIU/mL . Current regimen:  o Levothyroxine , 1 tablet once daily  . Interventions: o We discussed:  consistent administration of levothyroxine (at least 30 minutes before breakfast on an empty stomach).  . Patient self care activities o Patient will continue current medication and follow up for visit for  repeat TSH.   Medication management . Pharmacist Clinical Goal(s): o Over the next 90 days, patient will work with PharmD and providers to achieve optimal medication adherence . Current pharmacy: [facility] . Interventions o Comprehensive medication review performed. o Continue current medication management  strategy . Patient self care activities - Over the next 90 days, patient will: o Take medications as prescribed o Report any questions or concerns to PharmD and/or provider(s)  Please see past updates related to this goal by clicking on the "Past Updates" button in the selected goal         The patient verbalized understanding of instructions provided today and declined a print copy of patient instruction materials.   Telephone follow up appointment with pharmacy team member scheduled for: 3 months  Gaylord Shih, PharmD Clinical Pharmacist [facility] [place] [phone]

## 2020-07-24 NOTE — Chronic Care Management (AMB) (Signed)
Chronic Care Management Pharmacy  Name: [name]  MRN: [id number] DOB: February 11, 1946  Initial Questions: 1. Have you seen any other providers since your last visit? Yes  2. Any changes in your medicines or health? Yes   Chief Complaint/ HPI  [name],  74 y.o. , female presents for their Follow-Up CCM visit with the clinical pharmacist via telephone due to COVID-19 Pandemic.  PCP : [name], [name], MD  Their chronic conditions include: COPD,  diastolic HF, NSTeMI, s/p CABG, HTN, HLD, Psoriatic arthritis, Pain, Depressive disorder, Hypothyroidism, GERD, Barrett's esophagus  Office Visits: 01/24/2020- Chaya Jan, MD- Patient presented for hospital follow up visit for COPD exacerbation. Patient is back on baseline 2L of oxygen. Refilled albuterol nebulizer. Follow up in 3 months.  01/24/2020- Chaya Jan, MD- Patient presented for video visit to discuss levothyroxine dosing. Patient instructed to take levothyroxine first thing in the morning at least 30 minutes before any medications or food. Patient to return in 6 weeks for repeat TSH.   Consult Visit: 07/02/20 - Pulmonary - Sandrea Hughs, MD: Patient presented for COPD follow up. Patient has extreme SOB with activity. Prescribed Pulmicort nebulizer twice daily and Perforomist nebulizer twice daily to replace Symbicort. Follow up in 6 months.  8/4-06/08/20: Patient admitted for COPD exacerbation. Prescribed doxycycline x 5 days and long steroid taper at discharge. Recommended follow up in 1 week with PCP.  06/05/20: Patient went to the ED for SOB but did not wait to be seen.   04/03/20 Alben Deeds (rheum): Patient presented for RA follow up. Unable to access notes.  01/18/2020- Cardiology- Tobias Alexander, MD- Patient presented for office visit for 3 month follow up (CAD, s/p NSTEMI; dyspnea, acute on chronic diastolic dysfunction, HTN, HLD hypothryoid). No medication changes. Patient to  obtain labs: CBC, CMP, BNP, TSH, lipids, free T4 and T3.   Medications: Outpatient Encounter Medications as of 07/24/2020  Medication Sig  . acetaminophen (TYLENOL ARTHRITIS PAIN) 650 MG CR tablet Take 650 mg by mouth 2 (two) times daily.   Marland Kitchen albuterol (PROVENTIL) (2.5 MG/3ML) 0.083% nebulizer solution Take 3 mLs (2.5 mg total) by nebulization every 6 (six) hours as needed for wheezing or shortness of breath.  Marland Kitchen albuterol (VENTOLIN HFA) 108 (90 Base) MCG/ACT inhaler Inhale 2 puffs into the lungs every 4 (four) hours as needed (shortness of breath, if you can't catch your breath).  Marland Kitchen aspirin 81 MG tablet Take 1 tablet (81 mg total) by mouth daily.  Marland Kitchen atorvastatin (LIPITOR) 40 MG tablet TAKE 1 TABLET BY MOUTH  DAILY AT 6 PM. (Patient taking differently: Take 40 mg by mouth every evening. )  . budesonide (PULMICORT) 0.25 MG/2ML nebulizer solution One twice daily  . CALCIUM PO Take 1 tablet by mouth daily.  . Cholecalciferol (VITAMIN D3) 2000 UNITS TABS Take 2,000 Int'l Units by mouth daily.  . citalopram (CELEXA) 40 MG tablet TAKE 1 TABLET BY MOUTH IN  THE MORNING (Patient taking differently: Take 40 mg by mouth daily. )  . cyclobenzaprine (FLEXERIL) 5 MG tablet TAKE 1 TABLET BY MOUTH AT  BEDTIME AS NEEDED FOR  MUSCLE SPASM(S) (Patient taking differently: Take 5 mg by mouth at bedtime as needed for muscle spasms. )  . diltiazem (CARDIZEM CD) 120 MG 24 hr capsule TAKE 1 CAPSULE (120 MG TOTAL) BY MOUTH AT BEDTIME.  . formoterol (PERFOROMIST) 20 MCG/2ML nebulizer solution Take 2 mLs (20 mcg total) by nebulization 2 (two) times daily. Use in nebulizer twice daily  perfectly regularly  . furosemide (LASIX) 20 MG tablet TAKE 1 TABLET BY MOUTH  EVERY OTHER DAY (Patient taking differently: Take 20 mg by mouth every other day. )  . golimumab (SIMPONI ARIA) 50 MG/4ML SOLN injection Inject 50 mg into the vein every 8 (eight) weeks.   Marland Kitchen leflunomide (ARAVA) 20 MG tablet Take 1 tablet (20 mg total) by mouth daily.   Marland Kitchen levothyroxine (SYNTHROID) 125 MCG tablet TAKE 1 TABLET BY MOUTH EVERY DAY  . losartan (COZAAR) 50 MG tablet TAKE 1 TABLET BY MOUTH  DAILY (Patient taking differently: Take 50 mg by mouth daily. )  . montelukast (SINGULAIR) 10 MG tablet TAKE 1 TABLET BY MOUTH AT  BEDTIME  . OXYGEN Inhale 2 L into the lungs continuous. continuous o2   . pantoprazole (PROTONIX) 40 MG tablet TAKE 1 TABLET BY MOUTH  DAILY BEFORE BREAKFAST (Patient taking differently: Take 40 mg by mouth daily. )  . SYMBICORT 160-4.5 MCG/ACT inhaler USE 2 INHALATIONS BY MOUTH  TWICE DAILY (Patient taking differently: Inhale 2 puffs into the lungs in the morning and at bedtime. )  . Tiotropium Bromide Monohydrate (SPIRIVA RESPIMAT) 2.5 MCG/ACT AERS 2 puffs each am (Patient taking differently: Inhale 2 puffs into the lungs daily. )  . traMADol (ULTRAM) 50 MG tablet Take 1 tablet (50 mg total) by mouth 2 (two) times daily.  Marland Kitchen triamcinolone cream (KENALOG) 0.1 % Apply 1 application topically 2 (two) times daily as needed (for psoriasis).    No facility-administered encounter medications on file as of 07/24/2020.    Current Diagnosis/Assessment:  Goals Addressed            This Visit's Progress   . Pharmacy Care Plan       CARE PLAN ENTRY  Current Barriers:  . Chronic Disease Management support, education, and care coordination needs related to Hypertension, Hyperlipidemia, Heart Failure, COPD, Hypothyroidism, and Depression   Hypertension . Pharmacist Clinical Goal(s): o Over the next 90 days, patient will work with PharmD and providers to maintain BP goal <130/80 . Current regimen:   Losartan 50mg ,1 tablet once daily   Diltiazem (Cardizem CD) 120mg , 1 capsule at bedtime  . Interventions: . Discussed diet modifications. DASH diet:  following a diet emphasizing fruits and vegetables and low-fat dairy products along with whole grains, fish, poultry, and nuts. Reducing red meats and sugars.  . Patient self care activities  - Over the next 90 days, patient will: o Check blood pressure weekly, document, and provide at future appointments o Ensure daily salt intake < 1500 mg/day  Hyperlipidemia . Pharmacist Clinical Goal(s): o Over the next 90 days, patient will work with PharmD and providers to achieve LDL goal < 70 . Current regimen:  o Atorvastatin 40mg , 1 tablet once daily at 6 pm Interventions: Discussed diet modifications such as incorporating more fiber in your diet to help lower bad cholesterol . Patient self care activities - Over the next 90 days, patient will: o Continue current medications o Continue making diet changes such as adding more fiber to your diet to lower bad cholesterol  COPD . Pharmacist Clinical Goal(s) o Over the next 90 days, patient will work with PharmD and providers to Prevent worsening of shortness of breath and hospitalizations. . Current regimen:   Albuterol (Ventolin HFA) 170mcg/ act inhaler, inhale 2 puffs every four hours as needed for shortness of breath  Montelukast 10mg , 1 tablet at bedtime   Budesonide 0.25 mg/70mL nebulizer, inhale twice daily  Formoterol 20 mcg/mL, inhale  2 mLs twice daily  Tiotropium (Spiriva Respimat) 2.5 mcg/ act, inhale 2 puffs every morning  . Patient self care activities - Over the next 90 days, patient will: o Continue current medications and continue follow up visits with Dr. Sherene Sires (pulmonologist).   Depression . Pharmacist Clinical Goal(s) o Over the next 90 days, patient will work with PharmD and providers to Improve mood/ depression symptoms . Current regimen:   Citalopram , 1 tablet in the morning  . Interventions: o Discussed taper plan to discontinue citalopram.  . Patient self care activities o Patient will continue current medications and discuss further with Dr. Ardyth Harps at next office visit  Hypothyroidism . Pharmacist Clinical Goal(s) o Over the next 90 days, patient will work with PharmD and providers to  maintain TSH: 0.35 to 4.50 uIU/mL . Current regimen:  o Levothyroxine , 1 tablet once daily  . Interventions: o We discussed:  consistent administration of levothyroxine (at least 30 minutes before breakfast on an empty stomach).  . Patient self care activities o Patient will continue current medication and follow up for visit for repeat TSH.   Medication management . Pharmacist Clinical Goal(s): o Over the next 90 days, patient will work with PharmD and providers to achieve optimal medication adherence . Current pharmacy: [facility] . Interventions o Comprehensive medication review performed. o Continue current medication management strategy . Patient self care activities - Over the next 90 days, patient will: o Take medications as prescribed o Report any questions or concerns to PharmD and/or provider(s)  Please see past updates related to this goal by clicking on the "Past Updates" button in the selected goal        SDOH Interventions     Most Recent Value  SDOH Interventions  Financial Strain Interventions Intervention Not Indicated  Transportation Interventions Intervention Not Indicated      SDOH Interventions     Most Recent Value  SDOH Interventions  Financial Strain Interventions Intervention Not Indicated  Transportation Interventions Intervention Not Indicated      COPD   Last spirometry score: 02/08/2018 FVC: 28  FEV1: 29 FEV1/FVC: 103  Gold Grade: Gold 3 (FEV1 30-49%)  Eosinophil count:   Lab Results  Component Value Date/Time   EOSPCT 7 03/10/2019 11:47 AM  %                               Eos (Absolute):  Lab Results  Component Value Date/Time   EOSABS 0.5 03/10/2019 11:47 AM   EOSABS 0.8 (H) 06/23/2018 03:59 PM    Tobacco Status:  Social History   Tobacco Use  Smoking Status Former Smoker  . Packs/day: 1.50  . Years: 58.50  . Pack years: 87.75  . Types: Cigarettes  . Quit date: 03/03/2005  . Years since quitting: 15.4  Smokeless  Tobacco Never Used   Patient has failed these meds in past: Trelegy   Patient is currently controlled on the following medications:   Albuterol (Ventolin HFA) 120mcg/ act inhaler, inhale 2 puffs every four hours as needed for shortness of breath  Montelukast , 1 tablet at bedtime   Tiotropium (Spiriva Respimat) 2.5 mcg/ act, inhale 2 puffs every morning   Budesonide 0.25 mg/25mL, inhale twice daily  Formoterol 20 mcg/mL, inhale 2 mLs twice daily   Using maintenance inhaler regularly? Yes   Frequency of rescue inhaler use:  prn  - 2x exacerbations over last year. -used albuterol HFA once two  weeks ago (does not need frequently)  We discussed:  proper inhaler technique  Plan Managed by Dr. Sherene Sires (pulmonologist). Follow up visit in February. Continue current medications  Heart Failure   Type: Diastolic  Last ejection fraction: 65% - 70% (08/26/2018)   Patient is currently controlled on the following medications:   Furosemide 20mg ,1 tablet every other day   Losartan 50mg ,1 tablet once daily   We discussed weighing daily; if you gain more than 3 pounds in one day or 5 pounds in one week call your doctor   Plan Continue current medications   Hypertension   BP goal is:  <130/80  Office blood pressures are  BP Readings from Last 3 Encounters:  07/02/20 134/70  06/19/20 130/70  06/08/20 105/63   Patient has failed these meds in the past: amlodipine   Patient checks BP at home infrequently  Patient home BP readings are ranging: BP: 130/70s mmHg   Patient is controlled on:   Losartan 50mg ,1 tablet once daily   Diltiazem (Cardizem CD) 120mg , 1 capsule at bedtime   We discussed diet and exercise extensively  Plan Continue current medications   Hyperlipidemia  LDL goal < 70  Lipid Panel     Component Value Date/Time   CHOL 164 01/18/2020 1110   CHOL 117 07/12/2015 1508   TRIG 117 01/18/2020 1110   TRIG 75 07/12/2015 1508   HDL 56 01/18/2020 1110    HDL 51 07/12/2015 1508   CHOLHDL 2.9 01/18/2020 1110   CHOLHDL 2.3 08/25/2018 2019   VLDL 22 08/25/2018 2019   LDLCALC 87 01/18/2020 1110   LDLCALC 51 07/12/2015 1508   LDLDIRECT 131.1 08/10/2012 0829   LABVLDL 21 01/18/2020 1110     The ASCVD Risk score (Goff DC Jr., et al., 2013) failed to calculate for the following reasons:   The patient has a prior MI or stroke diagnosis   Patient is currently uncontrolled on the following medications:   Atorvastatin 40mg , 1 tablet once daily at 6 pm  We discussed: diet and exercise extensively -Exercise: patient is very limited with exercise due to her SOB on exertion -Diet: discussed incorporating more fiber into her diet to help lower bad cholesterol (ex. Swapping oatmeal for eggs a couple times a week) -Patient reports working on making sure she is eating whole grains and does not eat white bread  Plan Continue current medications  May need atorvastatin dose increase depending on results of next lipid panel. Managed by cardiology.  CAD (s/p NSTEMI, CABGx3)   Patient is currently controlled on the following medications:   Aspirin 81mg ,1 tablet once daily (mid morning)   Atorvastatin 40mg , 1 tablet once daily   Plan Managed by cardiologist.  Continue current medications  Psoriatic arthritis    Patient is currently controlled on the following medications:   golimumab (Simponi Aria), inject 50mg  every eight weeks   leflunomide (Arava) 20mg , 1 tablet once daily    Plan Continue current medications  Pain   Patient is currently controlled on the following medications:   Tramadol 50mg , 1 tablet twice daily   APAP 650mg , 1,300mg  twice daily   Cyclobenzaprine 5mg , 1 tablet at bedtime as needed for muscle spasm  Plan Continue current medications  Depressive disorder   Patient still hoping to discontinue and is aware she cannot stop cold [place]. Encouraged to discuss with Dr. Ardyth Harps at next office visit in Nov.    Patient is currently controlled on the following medications:   Citalopram 40mg , 1 tablet  in the morning   Plan Continue current medications. Will send message to PCP to address at next office visit.  Hypothyroidism   TSH  Date Value Ref Range Status  04/23/2020 0.61 0.35 - 4.50 uIU/mL Final    Patient is currently controlled on the following medications:   Levothyroxine , 1 tablet once daily   We discussed:  consistent administration of levothyroxine (at least 30 minutes before breakfast on an empty stomach).   Plan Continue current medications  GERD/ Barrett's esophagus    Patient has failed these meds in past: omeprazole   Patient is currently controlled on the following medications:   Pantoprazole 40mg , 1 tablet once daily before breakfast   Plan Continue current medications  Osteopenia (query)    Last DEXA Scan: 08/17/2018  T-Score femoral neck: -1.9  T-Score lumbar spine: 0.8  T-Score femur: -1.7  10-year probability of major osteoporotic fracture: 10.3%  10-year probability of hip fracture: 1.9%  No results found for: VD25OH   Patient is not a candidate for pharmacologic treatment  Patient is currently on the following medications:   Calcium, 1 tablet once daily   Vitamin D3 (cholecalciferol) ( ) 2000 units, 1 tablet once daily  We discussed:  Recommend [phone] units of vitamin D daily. Recommend 1200 mg of calcium daily from dietary and supplemental sources.  Plan Continue current medications   Psoriasis  Patient is currently controlled on the following medications:   Triamcinolone cream 1%, apply twice daily as needed for psoriasis   Plan Continue current medications   Vaccines   Reviewed and discussed patient's vaccination history.    Immunization History  Administered Date(s) Administered  . Influenza Split 08/04/2011, 09/01/2012, 07/05/2015  . Influenza Whole 09/21/2007, 07/23/2010, 10/03/2016  . Influenza, High Dose  Seasonal PF 07/27/2015, 12/24/2017, 08/09/2018, 08/29/2019  . Influenza-Unspecified 10/03/2013, 09/03/2014  . Pneumococcal Conjugate-13 04/12/2014  . Pneumococcal Polysaccharide-23 07/27/2015  . Pneumococcal-Unspecified 11/03/2004  . Tdap 08/12/2011    Plan Recommended patient receive influenza vaccine in office. Patient to bring COVID vaccine card to next in office visit.  Medication Management   Pt uses [facility] pharmacy for all medications Uses pill box? Yes Pt endorses 90% compliance  We discussed: Current pharmacy is preferred with insurance plan and patient is satisfied with pharmacy services  Plan  Continue current medication management strategy   Follow up: 3 month phone visit  Gaylord Shih, PharmD Clinical Pharmacist [facility] [phone]

## 2020-07-24 NOTE — Telephone Encounter (Signed)
Is she doing PT? That would be my recommendation if she is not. Ok to see an ortho, but not sure what else they could offer.

## 2020-07-24 NOTE — Telephone Encounter (Signed)
Patient has an appointment with Dr Constance Goltz today at 2 pm.  She will call back if needed.

## 2020-07-31 DIAGNOSIS — M0589 Other rheumatoid arthritis with rheumatoid factor of multiple sites: Secondary | ICD-10-CM | POA: Diagnosis not present

## 2020-07-31 DIAGNOSIS — Z79899 Other long term (current) drug therapy: Secondary | ICD-10-CM | POA: Diagnosis not present

## 2020-08-06 LAB — HM MAMMOGRAPHY

## 2020-08-07 ENCOUNTER — Encounter: Payer: Self-pay | Admitting: Internal Medicine

## 2020-08-18 ENCOUNTER — Other Ambulatory Visit: Payer: Self-pay | Admitting: Internal Medicine

## 2020-08-21 ENCOUNTER — Other Ambulatory Visit: Payer: Self-pay | Admitting: Internal Medicine

## 2020-08-21 ENCOUNTER — Telehealth: Payer: Self-pay | Admitting: Internal Medicine

## 2020-08-21 NOTE — Telephone Encounter (Signed)
Called and spoke with patient who confirmed medication that needs to be refilled and pharmacy. RX sent in. Nothing further needed at this time.

## 2020-08-21 NOTE — Telephone Encounter (Signed)
Called and spoke with patient she confirmed medication needing refilled and pharmacy. RX sent in. Nothing further needed at this time.

## 2020-09-05 ENCOUNTER — Other Ambulatory Visit: Payer: Self-pay | Admitting: Internal Medicine

## 2020-09-06 NOTE — Telephone Encounter (Signed)
Pt is requesting refill on Montelukast Sodium 10 MG Oral Tablet next ov 12/31/20 

## 2020-09-10 ENCOUNTER — Telehealth: Payer: Self-pay

## 2020-09-10 DIAGNOSIS — M545 Low back pain, unspecified: Secondary | ICD-10-CM | POA: Diagnosis not present

## 2020-09-10 NOTE — Telephone Encounter (Signed)
Please advise 

## 2020-09-10 NOTE — Telephone Encounter (Signed)
Patient called in wanting to speak with nurse , says she has some dark brown sputum and she has been taking her regular regiment from your pulmonary Dr and its not working says its getting darker.... wants nurse to call her and let her know what she should do    Please call and advise

## 2020-09-11 MED ORDER — AZITHROMYCIN 250 MG PO TABS
ORAL_TABLET | ORAL | 0 refills | Status: DC
Start: 2020-09-11 — End: 2020-11-22

## 2020-09-11 NOTE — Telephone Encounter (Signed)
Rx sent and patient is aware. 

## 2020-09-11 NOTE — Telephone Encounter (Signed)
Ok to send in a Rx for a zpak. If no better in 5 days or worsens prior to that time, will need evaluation.

## 2020-09-12 ENCOUNTER — Ambulatory Visit: Payer: Medicare Other | Admitting: Cardiology

## 2020-09-17 DIAGNOSIS — M545 Low back pain, unspecified: Secondary | ICD-10-CM | POA: Diagnosis not present

## 2020-09-19 ENCOUNTER — Ambulatory Visit: Payer: Medicare Other | Admitting: Internal Medicine

## 2020-09-24 DIAGNOSIS — M7062 Trochanteric bursitis, left hip: Secondary | ICD-10-CM | POA: Diagnosis not present

## 2020-09-24 DIAGNOSIS — M7061 Trochanteric bursitis, right hip: Secondary | ICD-10-CM | POA: Diagnosis not present

## 2020-09-25 DIAGNOSIS — M0589 Other rheumatoid arthritis with rheumatoid factor of multiple sites: Secondary | ICD-10-CM | POA: Diagnosis not present

## 2020-10-04 ENCOUNTER — Other Ambulatory Visit: Payer: Self-pay | Admitting: Cardiology

## 2020-10-04 DIAGNOSIS — E032 Hypothyroidism due to medicaments and other exogenous substances: Secondary | ICD-10-CM

## 2020-10-04 DIAGNOSIS — R7989 Other specified abnormal findings of blood chemistry: Secondary | ICD-10-CM

## 2020-10-10 ENCOUNTER — Telehealth: Payer: Self-pay | Admitting: Cardiology

## 2020-10-10 NOTE — Telephone Encounter (Signed)
No drug-drug interaction expected with Singulair (montelukas and current cardiac medication).  No drig-disease interaction noted either.   Okay to take as prescribed.

## 2020-10-10 NOTE — Telephone Encounter (Signed)
Will forward medication concern to our Pharmacist for further review and advisement on safety with pt taking Singulair with all other cardiac meds and history. Will follow-up with the pt accordingly thereafter, once recommendations are provided.

## 2020-10-10 NOTE — Telephone Encounter (Signed)
Pt c/o medication issue:  1. Name of Medication: montelukast (SINGULAIR) 10 MG tablet  2. How are you currently taking this medication (dosage and times per day)? 1 tablet daily  3. Are you having a reaction (difficulty breathing--STAT)? no  4. What is your medication issue? Patient states she does not think she is supposed to take the medication since she is a heart patient. She states she was given it for her breathing and that she is on oxygen 24 hrs a day. She would like to know if it is okay for her to be on this medication.

## 2020-10-10 NOTE — Telephone Encounter (Signed)
Left a detailed message on the pts confirmed voicemail with medication recommendations per our Pharmacist Raquel.  Left a detailed message on pts voicemail stating per our PharmD, there are no drug-drug interaction expected with Singulair, and no drug-disease interaction noted either, and it's safe to take this as prescribed. Left a detailed message for the pt to call back with any additional questions or concerns regarding message left.

## 2020-10-11 DIAGNOSIS — Z6841 Body Mass Index (BMI) 40.0 and over, adult: Secondary | ICD-10-CM | POA: Diagnosis not present

## 2020-10-11 DIAGNOSIS — M8580 Other specified disorders of bone density and structure, unspecified site: Secondary | ICD-10-CM | POA: Diagnosis not present

## 2020-10-11 DIAGNOSIS — M0589 Other rheumatoid arthritis with rheumatoid factor of multiple sites: Secondary | ICD-10-CM | POA: Diagnosis not present

## 2020-10-11 DIAGNOSIS — M150 Primary generalized (osteo)arthritis: Secondary | ICD-10-CM | POA: Diagnosis not present

## 2020-10-11 DIAGNOSIS — L409 Psoriasis, unspecified: Secondary | ICD-10-CM | POA: Diagnosis not present

## 2020-10-11 DIAGNOSIS — Z79899 Other long term (current) drug therapy: Secondary | ICD-10-CM | POA: Diagnosis not present

## 2020-10-11 DIAGNOSIS — I252 Old myocardial infarction: Secondary | ICD-10-CM | POA: Diagnosis not present

## 2020-10-11 DIAGNOSIS — M5136 Other intervertebral disc degeneration, lumbar region: Secondary | ICD-10-CM | POA: Diagnosis not present

## 2020-10-19 ENCOUNTER — Telehealth: Payer: Self-pay | Admitting: Pharmacist

## 2020-10-19 NOTE — Chronic Care Management (AMB) (Signed)
I left the patient a message about her upcoming appointment on 10-22-2020 @ 12:00 PM with the clinical pharmacist. She was asked to please have all medication on hand to review the pharmacist.  [name], [facility] Clinical Pharmacy Assistant [phone]

## 2020-10-19 NOTE — Progress Notes (Signed)
Error

## 2020-10-22 ENCOUNTER — Ambulatory Visit: Payer: Medicare Other | Admitting: Pharmacist

## 2020-10-22 DIAGNOSIS — E782 Mixed hyperlipidemia: Secondary | ICD-10-CM

## 2020-10-22 DIAGNOSIS — I10 Essential (primary) hypertension: Secondary | ICD-10-CM

## 2020-10-22 NOTE — Chronic Care Management (AMB) (Signed)
Chronic Care Management Pharmacy  Name: [name]  MRN: [id number] DOB: May 08, 1946  Initial Questions: 1. Have you seen any other providers since your last visit? Yes  2. Any changes in your medicines or health? Yes   Chief Complaint/ HPI  [name],  74 y.o. , female presents for their Follow-Up CCM visit with the clinical pharmacist via telephone due to COVID-19 Pandemic.  PCP : [name], [name], MD  Their chronic conditions include: COPD,  diastolic HF, NSTeMI, s/p CABG, HTN, HLD, Psoriatic arthritis, Pain, Depressive disorder, Hypothyroidism, GERD, Barrett's esophagus  Office Visits: 01/24/2020- Chaya Jan, MD- Patient presented for hospital follow up visit for COPD exacerbation. Patient is back on baseline 2L of oxygen. Refilled albuterol nebulizer. Follow up in 3 months.  01/24/2020- Chaya Jan, MD- Patient presented for video visit to discuss levothyroxine dosing. Patient instructed to take levothyroxine first thing in the morning at least 30 minutes before any medications or food. Patient to return in 6 weeks for repeat TSH.   Consult Visit: 08/01/20 Alben Deeds (rheumatology): Patient presented for RA follow up. Unable to access notes.  07/02/20 - Pulmonary - Sandrea Hughs, MD: Patient presented for COPD follow up. Patient has extreme SOB with activity. Prescribed Pulmicort nebulizer twice daily and Perforomist nebulizer twice daily to replace Symbicort. Follow up in 6 months.  8/4-06/08/20: Patient admitted for COPD exacerbation. Prescribed doxycycline x 5 days and long steroid taper at discharge. Recommended follow up in 1 week with PCP.  06/05/20: Patient went to the ED for SOB but did not wait to be seen.   04/03/20 Alben Deeds (rheum): Patient presented for RA follow up. Unable to access notes.  01/18/2020- Cardiology- Tobias Alexander, MD- Patient presented for office visit for 3 month follow up (CAD, s/p NSTEMI;  dyspnea, acute on chronic diastolic dysfunction, HTN, HLD hypothryoid). No medication changes. Patient to obtain labs: CBC, CMP, BNP, TSH, lipids, free T4 and T3.   Medications: Outpatient Encounter Medications as of 10/22/2020  Medication Sig  . Tiotropium Bromide Monohydrate (SPIRIVA RESPIMAT) 2.5 MCG/ACT AERS USE 2 INHALATIONS BY MOUTH  EACH MORNING  . acetaminophen (TYLENOL ARTHRITIS PAIN) 650 MG CR tablet Take 650 mg by mouth 2 (two) times daily.   Marland Kitchen albuterol (PROVENTIL) (2.5 MG/3ML) 0.083% nebulizer solution Take 3 mLs (2.5 mg total) by nebulization every 6 (six) hours as needed for wheezing or shortness of breath.  Marland Kitchen albuterol (VENTOLIN HFA) 108 (90 Base) MCG/ACT inhaler Inhale 2 puffs into the lungs every 4 (four) hours as needed (shortness of breath, if you can't catch your breath).  Marland Kitchen aspirin 81 MG tablet Take 1 tablet (81 mg total) by mouth daily.  Marland Kitchen atorvastatin (LIPITOR) 40 MG tablet TAKE 1 TABLET BY MOUTH  DAILY AT 6 PM. Please schedule appointment for future refills. Thank you  . azithromycin (ZITHROMAX) 250 MG tablet Use as directed  . budesonide (PULMICORT) 0.25 MG/2ML nebulizer solution One twice daily  . CALCIUM PO Take 1 tablet by mouth daily.  . Cholecalciferol (VITAMIN D3) 2000 UNITS TABS Take 2,000 Int'l Units by mouth daily.  . citalopram (CELEXA) 40 MG tablet TAKE 1 TABLET BY MOUTH IN  THE MORNING  . cyclobenzaprine (FLEXERIL) 5 MG tablet TAKE 1 TABLET BY MOUTH AT  BEDTIME AS NEEDED FOR  MUSCLE SPASM(S) (Patient taking differently: Take 5 mg by mouth at bedtime as needed for muscle spasms. )  . diltiazem (CARDIZEM CD) 120 MG 24 hr capsule TAKE 1 CAPSULE (120 MG  TOTAL) BY MOUTH AT BEDTIME.  . formoterol (PERFOROMIST) 20 MCG/2ML nebulizer solution Take 2 mLs (20 mcg total) by nebulization 2 (two) times daily. Use in nebulizer twice daily perfectly regularly  . furosemide (LASIX) 20 MG tablet TAKE 1 TABLET BY MOUTH  EVERY OTHER DAY (Patient taking differently: Take 20 mg by  mouth every other day. )  . golimumab (SIMPONI ARIA) 50 MG/4ML SOLN injection Inject 50 mg into the vein every 8 (eight) weeks.   Marland Kitchen leflunomide (ARAVA) 20 MG tablet Take 1 tablet (20 mg total) by mouth daily.  Marland Kitchen levothyroxine (SYNTHROID) 125 MCG tablet TAKE 1 TABLET BY MOUTH EVERY DAY  . losartan (COZAAR) 50 MG tablet TAKE 1 TABLET BY MOUTH  DAILY (Patient taking differently: Take 50 mg by mouth daily. )  . montelukast (SINGULAIR) 10 MG tablet TAKE 1 TABLET BY MOUTH AT  BEDTIME  . OXYGEN Inhale 2 L into the lungs continuous. continuous o2   . pantoprazole (PROTONIX) 40 MG tablet TAKE 1 TABLET BY MOUTH  DAILY BEFORE BREAKFAST (Patient taking differently: Take 40 mg by mouth daily. )  . traMADol (ULTRAM) 50 MG tablet Take 1 tablet (50 mg total) by mouth 2 (two) times daily.  Marland Kitchen triamcinolone cream (KENALOG) 0.1 % Apply 1 application topically 2 (two) times daily as needed (for psoriasis).   . [DISCONTINUED] SYMBICORT 160-4.5 MCG/ACT inhaler USE 2 INHALATIONS BY MOUTH  TWICE DAILY (Patient taking differently: Inhale 2 puffs into the lungs in the morning and at bedtime. )   No facility-administered encounter medications on file as of 10/22/2020.    Current Diagnosis/Assessment:  Goals Addressed            This Visit's Progress   . Pharmacy Care Plan       CARE PLAN ENTRY  Current Barriers:  . Chronic Disease Management support, education, and care coordination needs related to Hypertension, Hyperlipidemia, Heart Failure, COPD, Hypothyroidism, and Depression   Hypertension . Pharmacist Clinical Goal(s): o Over the next 120 days, patient will work with PharmD and providers to maintain BP goal <130/80 . Current regimen:   Losartan ,1 tablet once daily   Diltiazem (Cardizem CD) , 1 capsule at bedtime  . Interventions: . Discussed diet modifications. DASH diet:  following a diet emphasizing fruits and vegetables and low-fat dairy products along with whole grains, fish, poultry,  and nuts. Reducing red meats and sugars.  . Patient self care activities - Over the next 120 days, patient will: o Check blood pressure weekly, document, and provide at future appointments o Ensure daily salt intake < 1500 mg/day  Hyperlipidemia . Pharmacist Clinical Goal(s): o Over the next 120 days, patient will work with PharmD and providers to achieve LDL goal < 70 . Current regimen:  o Atorvastatin , 1 tablet once daily at 6 pm Interventions: Discussed diet modifications such as incorporating more fiber in your diet to help lower bad cholesterol . Patient self care activities - Over the next 120 days, patient will: o Continue current medications o Continue making diet changes such as adding more fiber to your diet to lower bad cholesterol  COPD . Pharmacist Clinical Goal(s) o Over the next 120 days, patient will work with PharmD and providers to Prevent worsening of shortness of breath and hospitalizations. . Current regimen:   Albuterol (Ventolin HFA) 126mcg/ act inhaler, inhale 2 puffs every four hours as needed for shortness of breath  Montelukast , 1 tablet at bedtime   Budesonide 0.25 mg/36mL nebulizer, inhale twice  daily  Formoterol 20 mcg/mL, inhale 2 mLs twice daily  Tiotropium (Spiriva Respimat) 2.5 mcg/ act, inhale 2 puffs every morning  . Patient self care activities - Over the next 120 days, patient will: o Continue current medications and continue follow up visits with Dr. Sherene Sires (pulmonologist).   Depression . Pharmacist Clinical Goal(s) o Over the next 120 days, patient will work with PharmD and providers to Improve mood/ depression symptoms . Current regimen:   Citalopram 40mg , 1 tablet in the morning  . Interventions: o Discussed taper plan to discontinue citalopram.  . Patient self care activities o Patient will continue current medications and discuss further with Dr. at next office visit  Hypothyroidism . Pharmacist Clinical  Goal(s) o Over the next 120 days, patient will work with PharmD and providers to maintain TSH: 0.35 to 4.50 uIU/mL . Current regimen:  o Levothyroxine Ardyth Harps, 1 tablet once daily  . Interventions: o We discussed:  consistent administration of levothyroxine (at least 30 minutes before breakfast on an empty stomach).  . Patient self care activities o Patient will continue current medication and follow up for visit for repeat TSH.   Medication management . Pharmacist Clinical Goal(s): o Over the next 120 days, patient will work with PharmD and providers to achieve optimal medication adherence . Current pharmacy: [facility] . Interventions o Comprehensive medication review performed. o Continue current medication management strategy . Patient self care activities - Over the next 120 days, patient will: o Take medications as prescribed o Report any questions or concerns to PharmD and/or provider(s)  Please see past updates related to this goal by clicking on the "Past Updates" button in the selected goal            COPD   Last spirometry score: 02/08/2018 FVC: 28  FEV1: 29 FEV1/FVC: 103  Gold Grade: Gold 3 (FEV1 30-49%)  Eosinophil count:   Lab Results  Component Value Date/Time   EOSPCT 7 03/10/2019 11:47 AM  %                               Eos (Absolute):  Lab Results  Component Value Date/Time   EOSABS 0.5 03/10/2019 11:47 AM   EOSABS 0.8 (H) 06/23/2018 03:59 PM    Tobacco Status:  Social History   Tobacco Use  Smoking Status Former Smoker  . Packs/day: 1.50  . Years: 58.50  . Pack years: 87.75  . Types: Cigarettes  . Quit date: 03/03/2005  . Years since quitting: 15.7  Smokeless Tobacco Never Used   Patient has failed these meds in past: Trelegy   Patient is currently controlled on the following medications:   Albuterol (Ventolin HFA) 187mcg/ act inhaler, inhale 2 puffs every four hours as needed for shortness of breath  Montelukast 10mg , 1 tablet at  bedtime   Tiotropium (Spiriva Respimat) 2.5 mcg/ act, inhale 2 puffs every morning   Budesonide 0.25 mg/14mL, inhale twice daily  Formoterol 20 mcg/mL, inhale 2 mLs twice daily   Using maintenance inhaler regularly? Yes   Frequency of rescue inhaler use:  prn  -Patient used the albuterol this morning and it was the first time in a few weeks -Patient feels stressed out and hard to breathe and usually that's when she uses the inhaler  We discussed:  proper inhaler technique  Plan Managed by Dr. (pulmonologist). Follow up visit in February. Continue current medications  Heart Failure   Type: Diastolic  Last ejection fraction: 65% - 70% (08/26/2018)   Patient is currently controlled on the following medications:   Furosemide 20mg ,1 tablet every other day   Losartan 50mg ,1 tablet once daily   We discussed weighing daily; if you gain more than 3 pounds in one day or 5 pounds in one week call your doctor  -Patient reports her weight has been stable but notices that when she wears certain shoes she gets more fluid build up -Discussed the importance of elevating feet  Plan Continue current medications   Hypertension   BP goal is:  <130/80  Office blood pressures are  BP Readings from Last 3 Encounters:  07/02/20 134/70  06/19/20 130/70  06/08/20 105/63   Patient has failed these meds in the past: amlodipine   Patient checks BP at home infrequently  Patient home BP readings are ranging: BP: 130/70s mmHg   Patient is controlled on:   Losartan 50mg ,1 tablet once daily   Diltiazem (Cardizem CD) 120mg , 1 capsule at bedtime   We discussed diet and exercise extensively and discussed the importance of regularly checking blood pressure at home  Plan Continue current medications   Hyperlipidemia  LDL goal < 70  Lipid Panel     Component Value Date/Time   CHOL 164 01/18/2020 1110   CHOL 117 07/12/2015 1508   TRIG 117 01/18/2020 1110   TRIG 75 07/12/2015 1508    HDL 56 01/18/2020 1110   HDL 51 07/12/2015 1508   CHOLHDL 2.9 01/18/2020 1110   CHOLHDL 2.3 08/25/2018 2019   VLDL 22 08/25/2018 2019   LDLCALC 87 01/18/2020 1110   LDLCALC 51 07/12/2015 1508   LDLDIRECT 131.1 08/10/2012 0829   LABVLDL 21 01/18/2020 1110     The ASCVD Risk score (Goff DC Jr., et al., 2013) failed to calculate for the following reasons:   The patient has a prior MI or stroke diagnosis   Patient is currently uncontrolled on the following medications:   Atorvastatin 40mg , 1 tablet once daily at 6 pm  We discussed: diet and exercise extensively -Exercise: patient is very limited with exercise due to her SOB on exertion -Diet: discussed incorporating more fiber into her diet to help lower bad cholesterol (ex. Swapping oatmeal for eggs a couple times a week) -Patient reports working on making sure she is eating whole grains and does not eat white bread  Plan Continue current medications  Recommend increasing atorvastatin if LDL above goal of < 70 at next lab visit. Managed by cardiology.  CAD (s/p NSTEMI, CABGx3)   Patient is currently controlled on the following medications:   Aspirin 81mg ,1 tablet once daily (mid morning)   Atorvastatin 40mg , 1 tablet once daily   Plan Managed by cardiologist.  Continue current medications  Psoriatic arthritis    Patient is currently controlled on the following medications:   golimumab (Simponi Aria), inject 50mg  every eight weeks   leflunomide (Arava) 20mg , 1 tablet once daily    Plan Continue current medications  Pain   Patient is currently controlled on the following medications:   Tramadol 50mg , 1 tablet twice daily   APAP 650mg , 1,300mg  twice daily   Cyclobenzaprine 5mg , 1 tablet at bedtime as needed for muscle spasm  Plan Continue current medications  Depressive disorder   Patient still hoping to discontinue and is aware she cannot stop cold 09/11/2015. Encouraged to discuss with Dr. 10/10/2012 at next  office visit in Nov.   Patient is currently controlled on the following medications:  Citalopram 40mg , 1 tablet in the morning   Plan Continue current medications.   Hypothyroidism   TSH  Date Value Ref Range Status  04/23/2020 0.61 0.35 - 4.50 uIU/mL Final    Patient is currently controlled on the following medications:   Levothyroxine , 1 tablet once daily   We discussed:  consistent administration of levothyroxine (at least 30 minutes before breakfast on an empty stomach).   Plan Continue current medications  GERD/ Barrett's esophagus    Patient has failed these meds in past: omeprazole   Patient is currently controlled on the following medications:   Pantoprazole 40mg , 1 tablet once daily before breakfast   Plan Continue current medications  Osteopenia (query)    Last DEXA Scan: 08/17/2018  T-Score femoral neck: -1.9  T-Score lumbar spine: 0.8  T-Score femur: -1.7  10-year probability of major osteoporotic fracture: 10.3%  10-year probability of hip fracture: 1.9%  No results found for: VD25OH   Patient is not a candidate for pharmacologic treatment  Patient is currently on the following medications:   Calcium, 1 tablet once daily   Vitamin D3 (cholecalciferol) ( ) 2000 units, 1 tablet once daily  We discussed:  Recommend [phone] units of vitamin D daily. Recommend 1200 mg of calcium daily from dietary and supplemental sources.  Plan Continue current medications   Psoriasis  Patient is currently controlled on the following medications:   Triamcinolone cream 1%, apply twice daily as needed for psoriasis   Plan Continue current medications   Vaccines   Reviewed and discussed patient's vaccination history.    Immunization History  Administered Date(s) Administered  . Influenza Split 08/04/2011, 09/01/2012, 07/05/2015  . Influenza Whole 09/21/2007, 07/23/2010, 10/03/2016  . Influenza, High Dose Seasonal PF 07/27/2015, 12/24/2017,  08/09/2018, 08/29/2019  . Influenza-Unspecified 10/03/2013, 09/03/2014  . Pneumococcal Conjugate-13 04/12/2014  . Pneumococcal Polysaccharide-23 07/27/2015  . Pneumococcal-Unspecified 11/03/2004  . Tdap 08/12/2011    Patient reports she plans to get her influenza and COVID booster shots.  Plan  Recommended for patient to get shingles vaccine at pharmacy.  Medication Management   Pt uses [facility] pharmacy for all medications Uses pill box? Yes Pt endorses 90% compliance  We discussed: Current pharmacy is preferred with insurance plan and patient is satisfied with pharmacy services  Plan  Continue current medication management strategy   Follow up: 4 month phone visit  Gaylord Shih, PharmD Clinical Pharmacist [facility] [place] [phone]

## 2020-10-30 ENCOUNTER — Ambulatory Visit: Payer: Medicare Other | Admitting: Internal Medicine

## 2020-11-09 ENCOUNTER — Ambulatory Visit: Payer: Medicare Other | Admitting: Internal Medicine

## 2020-11-20 ENCOUNTER — Encounter: Payer: Self-pay | Admitting: Internal Medicine

## 2020-11-22 ENCOUNTER — Ambulatory Visit (INDEPENDENT_AMBULATORY_CARE_PROVIDER_SITE_OTHER): Payer: Medicare Other

## 2020-11-22 ENCOUNTER — Encounter: Payer: Self-pay | Admitting: Internal Medicine

## 2020-11-22 ENCOUNTER — Other Ambulatory Visit: Payer: Self-pay

## 2020-11-22 ENCOUNTER — Ambulatory Visit (INDEPENDENT_AMBULATORY_CARE_PROVIDER_SITE_OTHER): Payer: Medicare Other | Admitting: Internal Medicine

## 2020-11-22 VITALS — BP 150/90 | HR 62 | Temp 98.7°F | Wt 189.6 lb

## 2020-11-22 DIAGNOSIS — I517 Cardiomegaly: Secondary | ICD-10-CM | POA: Diagnosis not present

## 2020-11-22 DIAGNOSIS — J441 Chronic obstructive pulmonary disease with (acute) exacerbation: Secondary | ICD-10-CM | POA: Diagnosis not present

## 2020-11-22 DIAGNOSIS — E039 Hypothyroidism, unspecified: Secondary | ICD-10-CM

## 2020-11-22 DIAGNOSIS — J449 Chronic obstructive pulmonary disease, unspecified: Secondary | ICD-10-CM | POA: Diagnosis not present

## 2020-11-22 DIAGNOSIS — Z1152 Encounter for screening for COVID-19: Secondary | ICD-10-CM | POA: Diagnosis not present

## 2020-11-22 DIAGNOSIS — J9621 Acute and chronic respiratory failure with hypoxia: Secondary | ICD-10-CM | POA: Diagnosis not present

## 2020-11-22 LAB — CBC WITH DIFFERENTIAL/PLATELET
Basophils Absolute: 0.2 10*3/uL — ABNORMAL HIGH (ref 0.0–0.1)
Basophils Relative: 1.3 % (ref 0.0–3.0)
Eosinophils Absolute: 0.2 10*3/uL (ref 0.0–0.7)
Eosinophils Relative: 1.8 % (ref 0.0–5.0)
HCT: 37.9 % (ref 36.0–46.0)
Hemoglobin: 12.1 g/dL (ref 12.0–15.0)
Lymphocytes Relative: 17.7 % (ref 12.0–46.0)
Lymphs Abs: 2.1 10*3/uL (ref 0.7–4.0)
MCHC: 31.9 g/dL (ref 30.0–36.0)
MCV: 85 fl (ref 78.0–100.0)
Monocytes Absolute: 1.1 10*3/uL — ABNORMAL HIGH (ref 0.1–1.0)
Monocytes Relative: 9 % (ref 3.0–12.0)
Neutro Abs: 8.4 10*3/uL — ABNORMAL HIGH (ref 1.4–7.7)
Neutrophils Relative %: 70.2 % (ref 43.0–77.0)
Platelets: 231 10*3/uL (ref 150.0–400.0)
RBC: 4.46 Mil/uL (ref 3.87–5.11)
RDW: 16.4 % — ABNORMAL HIGH (ref 11.5–15.5)
WBC: 11.9 10*3/uL — ABNORMAL HIGH (ref 4.0–10.5)

## 2020-11-22 LAB — COMPREHENSIVE METABOLIC PANEL
ALT: 31 U/L (ref 0–35)
AST: 19 U/L (ref 0–37)
Albumin: 4 g/dL (ref 3.5–5.2)
Alkaline Phosphatase: 55 U/L (ref 39–117)
BUN: 32 mg/dL — ABNORMAL HIGH (ref 6–23)
CO2: 28 mEq/L (ref 19–32)
Calcium: 9.8 mg/dL (ref 8.4–10.5)
Chloride: 103 mEq/L (ref 96–112)
Creatinine, Ser: 1.37 mg/dL — ABNORMAL HIGH (ref 0.40–1.20)
GFR: 38.25 mL/min — ABNORMAL LOW (ref >60.00)
Glucose, Bld: 110 mg/dL — ABNORMAL HIGH (ref 70–99)
Potassium: 4.2 mEq/L (ref 3.5–5.1)
Sodium: 139 mEq/L (ref 135–145)
Total Bilirubin: 0.6 mg/dL (ref 0.2–1.2)
Total Protein: 6.7 g/dL (ref 6.0–8.3)

## 2020-11-22 LAB — TROPONIN I (HIGH SENSITIVITY): High Sens Troponin I: 130 ng/L (ref 2–17)

## 2020-11-22 LAB — TSH: TSH: 1.77 u[IU]/mL (ref 0.35–4.50)

## 2020-11-22 MED ORDER — PREDNISONE 10 MG (21) PO TBPK: ORAL_TABLET | ORAL | 0 refills | Status: DC

## 2020-11-22 MED ORDER — AZITHROMYCIN 250 MG PO TABS: ORAL_TABLET | ORAL | 0 refills | Status: DC

## 2020-11-22 MED ORDER — CEFTRIAXONE SODIUM 1 G IJ SOLR
1.0000 g | Freq: Once | INTRAMUSCULAR | Status: AC
  Administered 2020-11-22: 1 g via INTRAMUSCULAR

## 2020-11-22 MED ORDER — METHYLPREDNISOLONE ACETATE 40 MG/ML IJ SUSP
40.0000 mg | Freq: Once | INTRAMUSCULAR | Status: AC
  Administered 2020-11-22: 40 mg via INTRAMUSCULAR

## 2020-11-22 NOTE — Patient Instructions (Signed)
-  Nice seeing you today!!  -Lab work today; will notify you once results are available.  -Chest xray today.  -Steroid and rocephin (antibiotic) injection today.  -Start prednisone taper at home tomorrow.  -Start zpak as directed for 6 days.  -Schedule follow up in 1 week.

## 2020-11-22 NOTE — Addendum Note (Signed)
Addended by: Lerry Liner on: 11/22/2020 12:18 PM   Modules accepted: Orders

## 2020-11-22 NOTE — Addendum Note (Signed)
Addended by: Lerry Liner on: 11/22/2020 12:17 PM   Modules accepted: Orders

## 2020-11-22 NOTE — Progress Notes (Signed)
Established Patient Office Visit     This visit occurred during the SARS-CoV-2 public health emergency.  Safety protocols were in place, including screening questions prior to the visit, additional usage of staff PPE, and extensive cleaning of exam room while observing appropriate contact time as indicated for disinfecting solutions.    CC/Reason for Visit: Increased dyspnea on exertion  HPI: [name] is a 75 y.o. female who is coming in today for the above mentioned reasons.  She has a past medical history significant for chronic hypoxemic respiratory failure due to COPD.  She is maintained on 2 to 3 L of oxygen.  She just completed a prednisone taper 5 days ago for increasing respiratory symptoms.  She feels like she is worsening.  Her symptoms started about 7 days ago but have acutely worsened over the past 2 days.  She is here today with a caregiver who tells me that walking from the kitchen to the front door to open it makes her extremely short of breath, "white as a sheet".  She denies true chest pain.  She has been increasing her nebulizer treatments without relief.  She does not have any known COVID exposures.  She has had 2 Pfizer vaccines, she has not yet boosted.  She has noticed increase of mucus production, it is white, yellow in color.  She has some dizziness and lightheadedness at times.  Sometimes she gets nauseous with extreme fatigue.  No additional URI symptoms, no headaches, no body aches, no fever.   Past Medical/Surgical History: Past Medical History:  Diagnosis Date  . Acute bronchitis 09/25/2015  . ACUTE ON CHRONIC DIASTOLIC HEART FAILURE 12/04/2010   Qualifier: Diagnosis of  By: Ladona Ridgel, MD, [facility], [place]   . Arthritis    OA RIGHT KNEE WITH PAIN  . Barrett esophagus   . Bradycardia 06/01/2015  . CHF (congestive heart failure) (HCC)   . Chronic respiratory failure (HCC)   . Chronic respiratory failure with hypoxia and hypercapnia (HCC) 02/04/2010    Followed in Pulmonary clinic/ [facility]/ [facility]       - 02 dependent  since 07/02/10 >>  83% RA December 05, 2010       - ONO RA 08/05/12  :  Positive sat < 89 x 2:4m> repeat on 2lpm rec 08/12/2012  - 06/17/2013 reported desat with activity p Knee surgery > rec restart 2lpm with activity  - 06/27/2013   Walked 2lpm  x one lap @ 185 stopped due to sat 88% not sob , desat to 82% on RA just at th  . COPD III spirometry if use FEV1/VC p saba  07/18/2010   Quit smoking May 2006       - PFT's  04/12/10 FEV1  1.21 (69%) ratio 77 and no change p B2,  DLC0 56%   VC 70%         - PFTs  08/08/2013 FEV1 1.21 (60%) ratio 86 and no change p B2 DLCO 79%  VC 72%  On symbicort 160 2bid  - PFT's  02/08/2018  FEV1 0.70 (40 % ) ratio 56 if use FEV1/VC  p 38 % improvement from saba p symb 160 prior to study with DLCO  78 % corrects to 147  % for alv volume   - 02/08/2018  . Cough variant asthma 02/26/2011   Followed in Pulmonary clinic/ [facility]/ [facility]  - PFT's  06/04/15  FEV1 1.20 (67 % ) ratio 83  p 6 %  improvement from saba with DLCO  80 % corrects to 132 % for alv volume      - Clinical dx based on response to symbicort       FENO 09/16/2016  =   96 on symbicort 160 2bid > added singulair  Allergy profile 09/16/2016 >  Eos 0.5 /  IgE  78 neg RAST  -  Referred to rehab 04/29/2017 > completed  . Essential hypertension 04/20/2007   Qualifier: Diagnosis of  By: Marcelyn Ditty RN, Katy Fitch   . GERD (gastroesophageal reflux disease)   . History of ARDS 2006  . History of home oxygen therapy    AT NIGHT WHEN SLEEPING 2 L / MIN NASAL CANNULA  . Hyperlipidemia 07/12/2015  . Hypothyroidism   . Morbid (severe) obesity due to excess calories (HCC) 04/22/2015   pfts with erv 14% 06/04/15  And 33% 02/08/2018   . NSTEMI (non-ST elevated myocardial infarction) (HCC) 05/31/2015  . Pneumococcal pneumonia (HCC) 2006   HOSPITALIZED AND DEVELOPED ARDS  . Psoriatic arthritis (HCC)   . PULMONARY FIBROSIS ILD POST INFLAMMATORY CHRONIC 07/18/2010    Followed as Primary Care Patient/  Healthcare/ [facility]  -s/p ARDS 2006 with bacteremic S  Pna       - CT chest 07/03/10 Nonspecific PF mostly upper lobes       - CT chest 12/03/10 acute gg changes and effusions c/w chf - PFT's  02/08/2018  FVC 0.64 (28 %)   with DLCO  78 % corrects to 147 % for alv volume     . Rheumatic disease   . 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 SOB (shortness of breath) on exertion   . Stroke [facility])     Past Surgical History:  Procedure Laterality Date  . ABDOMINOPLASTY    . CARDIAC CATHETERIZATION N/A 06/01/2015   Procedure: Left Heart Cath and Coronary Angiography;  Surgeon: Runell Gess, MD;  Location: [facility] INVASIVE CV LAB;  Service: Cardiovascular;  Laterality: N/A;  . CARPAL TUNNEL RELEASE    . CHOLECYSTECTOMY    . CORONARY ARTERY BYPASS GRAFT N/A 06/04/2015   Procedure: CORONARY ARTERY BYPASS GRAFT times three            with left internal mammary artery and right leg saphenous vein;  Surgeon: Alleen Borne, MD;  Location: [facility] OR;  Service: Open Heart Surgery;  Laterality: N/A;  . cosmetic breast surgery    . JOINT REPLACEMENT    . KNEE ARTHROSCOPY Left   . TEE WITHOUT CARDIOVERSION  06/04/2015   Procedure: TRANSESOPHAGEAL ECHOCARDIOGRAM (TEE);  Surgeon: Alleen Borne, MD;  Location: [facility] OR;  Service: Open Heart Surgery;;  . TOTAL KNEE ARTHROPLASTY Left   . TOTAL KNEE ARTHROPLASTY Right 06/06/2013   Procedure: RIGHT TOTAL KNEE ARTHROPLASTY;  Surgeon: Loanne Drilling, MD;  Location: [facility] ORS;  Service: Orthopedics;  Laterality: Right;    Social History:  reports that she quit smoking about 15 years ago. Her smoking use included cigarettes. She has a 87.75 pack-year smoking history. She has never used smokeless tobacco. She reports that she does not drink alcohol and does not use drugs.  Allergies: No Known Allergies  Family History:  Family History  Problem Relation Age of Onset  . Breast cancer Mother   . Coronary artery disease Father   . Rheum arthritis Father       Current Outpatient Medications:  .  acetaminophen (TYLENOL) 650 MG CR tablet, Take 650 mg by mouth 2 (two) times  daily., Disp: , Rfl:  .  albuterol (PROVENTIL) (2.5 MG/3ML) 0.083% nebulizer solution, Take 3 mLs (2.5 mg total) by nebulization every 6 (six) hours as needed for wheezing or shortness of breath., Disp: 150 mL, Rfl: 1 .  albuterol (VENTOLIN HFA) 108 (90 Base) MCG/ACT inhaler, Inhale 2 puffs into the lungs every 4 (four) hours as needed (shortness of breath, if you can't catch your breath)., Disp: 8 g, Rfl: 5 .  aspirin 81 MG tablet, Take 1 tablet (81 mg total) by mouth daily., Disp: 30 tablet, Rfl: 0 .  atorvastatin (LIPITOR) 40 MG tablet, TAKE 1 TABLET BY MOUTH  DAILY AT 6 PM. Please schedule appointment for future refills. Thank you, Disp: 90 tablet, Rfl: 0 .  azithromycin (ZITHROMAX) 250 MG tablet, Take as directed, Disp: 6 tablet, Rfl: 0 .  budesonide (PULMICORT) 0.25 MG/2ML nebulizer solution, One twice daily, Disp: 120 mL, Rfl: 12 .  CALCIUM PO, Take 1 tablet by mouth daily., Disp: , Rfl:  .  Cholecalciferol (VITAMIN D3) 2000 UNITS TABS, Take 2,000 Int'l Units by mouth daily., Disp: , Rfl:  .  citalopram (CELEXA) 40 MG tablet, TAKE 1 TABLET BY MOUTH IN  THE MORNING, Disp: 90 tablet, Rfl: 3 .  cyclobenzaprine (FLEXERIL) 5 MG tablet, TAKE 1 TABLET BY MOUTH AT  BEDTIME AS NEEDED FOR  MUSCLE SPASM(S) (Patient taking differently: Take 5 mg by mouth at bedtime as needed for muscle spasms.), Disp: 90 tablet, Rfl: 0 .  diltiazem (CARDIZEM CD) 120 MG 24 hr capsule, TAKE 1 CAPSULE (120 MG TOTAL) BY MOUTH AT BEDTIME., Disp: 90 capsule, Rfl: 1 .  formoterol (PERFOROMIST) 20 MCG/2ML nebulizer solution, Take 2 mLs (20 mcg total) by nebulization 2 (two) times daily. Use in nebulizer twice daily perfectly regularly, Disp: 120 mL, Rfl: 11 .  furosemide (LASIX) 20 MG tablet, TAKE 1 TABLET BY MOUTH  EVERY OTHER DAY (Patient taking differently: Take 20 mg by mouth every other day.), Disp: 45  tablet, Rfl: 3 .  golimumab (SIMPONI ARIA) 50 MG/4ML SOLN injection, Inject 50 mg into the vein every 8 (eight) weeks. , Disp: , Rfl:  .  leflunomide (ARAVA) 20 MG tablet, Take 1 tablet (20 mg total) by mouth daily., Disp: , Rfl:  .  levothyroxine (SYNTHROID) 125 MCG tablet, TAKE 1 TABLET BY MOUTH EVERY DAY, Disp: 90 tablet, Rfl: 1 .  losartan (COZAAR) 50 MG tablet, TAKE 1 TABLET BY MOUTH  DAILY (Patient taking differently: Take 50 mg by mouth daily.), Disp: 90 tablet, Rfl: 3 .  montelukast (SINGULAIR) 10 MG tablet, TAKE 1 TABLET BY MOUTH AT  BEDTIME, Disp: 90 tablet, Rfl: 3 .  OXYGEN, Inhale 2 L into the lungs continuous. continuous o2, Disp: , Rfl:  .  pantoprazole (PROTONIX) 40 MG tablet, TAKE 1 TABLET BY MOUTH  DAILY BEFORE BREAKFAST (Patient taking differently: Take 40 mg by mouth daily.), Disp: 90 tablet, Rfl: 3 .  predniSONE (STERAPRED UNI-PAK 21 TAB) 10 MG (21) TBPK tablet, Take as directed, Disp: 21 tablet, Rfl: 0 .  Tiotropium Bromide Monohydrate (SPIRIVA RESPIMAT) 2.5 MCG/ACT AERS, USE 2 INHALATIONS BY MOUTH  EACH MORNING, Disp: 12 g, Rfl: 4 .  traMADol (ULTRAM) 50 MG tablet, Take 1 tablet (50 mg total) by mouth 2 (two) times daily., Disp: 60 tablet, Rfl: 1 .  triamcinolone cream (KENALOG) 0.1 %, Apply 1 application topically 2 (two) times daily as needed (for psoriasis). , Disp: , Rfl:   Current Facility-Administered Medications:  .  cefTRIAXone (ROCEPHIN) injection 1  g, 1 g, Intramuscular, Once, [name], [name], MD .  methylPREDNISolone acetate (DEPO-MEDROL) injection 40 mg, 40 mg, Intramuscular, Once, [name], [name], MD  Review of Systems:  Constitutional: Denies fever, chills. HEENT: Denies photophobia, eye pain, redness, hearing loss, ear pain, congestion, sore throat, rhinorrhea, sneezing, mouth sores, trouble swallowing, neck pain, neck stiffness and tinnitus.   Respiratory: Positive for SOB, DOE, cough, chest tightness,  and wheezing.   Cardiovascular:  Denies  palpitations and leg swelling.  Gastrointestinal: Denies  vomiting, abdominal pain, diarrhea, constipation, blood in stool and abdominal distention.  Genitourinary: Denies dysuria, urgency, frequency, hematuria, flank pain and difficulty urinating.  Endocrine: Denies: hot or cold intolerance, sweats, changes in hair or nails, polyuria, polydipsia. Musculoskeletal: Denies myalgias, back pain, joint swelling, arthralgias and gait problem.  Skin: Denies pallor, rash and wound.  Neurological: Denies seizures, syncope,  numbness and headaches.  Hematological: Denies adenopathy. Easy bruising, personal or family bleeding history  Psychiatric/Behavioral: Denies suicidal ideation, mood changes, confusion, nervousness, sleep disturbance and agitation    Physical Exam: Vitals:   11/22/20 1108  BP: (!) 150/90  Pulse: 62  Temp: 98.7 F (37.1 C)  TempSrc: Oral  SpO2: 95%  Weight: 189 lb 9.6 oz (86 kg)    Body mass index is 38.29 kg/m.  Constitutional: NAD, calm, comfortable, pale, ambulates with a walker, oxygen tank in place. Eyes: PERRL, lids and conjunctivae normal ENMT: Mucous membranes are moist.  Respiratory: Increased respiratory effort, no accessory muscle use, increased expiratory wheezes bilaterally.  Unable to speak in full sentences. Cardiovascular: Regular rate and rhythm, no murmurs / rubs / gallops. No extremity edema. 2+ pedal pulses.  Neurologic: Grossly intact and nonfocal Psychiatric: Normal judgment and insight. Alert and oriented x 3. Normal mood.    Impression and Plan:  COPD with acute exacerbation (HCC) Acute on chronic respiratory failure with hypoxia (HCC)  -She will receive intramuscular Rocephin and methylprednisolone in office today. -She will be sent home with a Z-Pak and a prednisone taper as well. -I will go ahead and order some labs as she seems very pale to make sure she is not anemic, I will go ahead and add a troponin as well although I doubt  true cardiac disease given her history.  Slight troponin elevation (if present) may just be due to her acute hypoxemic respiratory failure. -She will get a chest x-ray today in office. -I think it is unlikely that she has COVID, however have urged her to get tested today. -Given her propensity for hospitalizations due to COPD and her distress today, I will go ahead and schedule her to return next week to follow her progress. -If for some reason she worsens or fails to improve over the weekend, I have advised that she seek immediate care in the emergency department.   Hypothyroidism, unspecified type  - Plan: TSH  Time Spent: 45 minutes discussing symptoms with patient, examining patient, formulating plan of care and discussing plans for follow-up.   Patient Instructions  -Nice seeing you today!!  -Lab work today; will notify you once results are available.  -Chest xray today.  -Steroid and rocephin (antibiotic) injection today.  -Start prednisone taper at home tomorrow.  -Start zpak as directed for 6 days.  -Schedule follow up in 1 week.     Chaya Jan, MD [facility] at [facility]

## 2020-11-26 ENCOUNTER — Telehealth: Payer: Self-pay | Admitting: Internal Medicine

## 2020-11-26 NOTE — Telephone Encounter (Signed)
Patient states she tested positive for Covid on 11/23/2019. She received her positive result on 11/25/2019.  She would like Fleet Contras or Dr. Ardyth Harps to call her regarding a transfusion.  She also wants to know if she still needs to keep her appointment for 11/27/2020. I did advise that we will have to schedule the appointment as virtual if Dr. Ardyth Harps wants her to keep that appointment.  Please advise.

## 2020-11-27 ENCOUNTER — Telehealth (INDEPENDENT_AMBULATORY_CARE_PROVIDER_SITE_OTHER): Payer: Medicare Other | Admitting: Internal Medicine

## 2020-11-27 ENCOUNTER — Other Ambulatory Visit: Payer: Self-pay | Admitting: Internal Medicine

## 2020-11-27 ENCOUNTER — Encounter: Payer: Self-pay | Admitting: Internal Medicine

## 2020-11-27 VITALS — HR 92 | Wt 192.0 lb

## 2020-11-27 DIAGNOSIS — J441 Chronic obstructive pulmonary disease with (acute) exacerbation: Secondary | ICD-10-CM | POA: Diagnosis not present

## 2020-11-27 DIAGNOSIS — U071 COVID-19: Secondary | ICD-10-CM

## 2020-11-27 NOTE — Progress Notes (Signed)
Virtual Visit via Video Note  I connected with [name] on 11/27/20 at  3:00 PM EST by a video enabled telemedicine application and verified that I am speaking with the correct person using two identifiers.  Location patient: home Location provider: work office Persons participating in the virtual visit: patient, provider  I discussed the limitations of evaluation and management by telemedicine and the availability of in person appointments. The patient expressed understanding and agreed to proceed.   HPI: [name] has a history of chronic respiratory failure due to COPD on 2 to 3 L of baseline oxygen with a history of frequent exacerbations.  She came to see me last week with increased respiratory distress.  She was given IM Rocephin, IM steroids and given a prescription for a Z-Pak and prednisone taper.  Chest x-ray was done at that visit that showed no active cardiopulmonary disease with mild diffuse coarse chronic interstitial opacities.  She was asked to be tested for Covid.  She got tested on 1/20 and was informed on 1/22 that she is positive.  She feels overall better although still not back to her baseline.  She states that her main concern at this time is continued chest tightness.  She had some mildly increased troponin when I saw her last week that I thought was due to her acute on chronic respiratory failure.  She does not feel "sick enough" to go to the emergency department.  A referral was placed today for monoclonal antibody/remdesivir, she has not yet heard anything from them.   ROS: Constitutional: Denies fever, chills, diaphoresis, appetite change and fatigue.  HEENT: Denies photophobia, eye pain, redness, hearing loss, ear pain, congestion, sore throat, rhinorrhea, sneezing, mouth sores, trouble swallowing, neck pain, neck stiffness and tinnitus.   Respiratory: Denies SOB, DOE, cough, chest tightness,  and wheezing.   Cardiovascular: Denies chest pain,  palpitations and leg swelling.  Gastrointestinal: Denies nausea, vomiting, abdominal pain, diarrhea, constipation, blood in stool and abdominal distention.  Genitourinary: Denies dysuria, urgency, frequency, hematuria, flank pain and difficulty urinating.  Endocrine: Denies: hot or cold intolerance, sweats, changes in hair or nails, polyuria, polydipsia. Musculoskeletal: Denies myalgias, back pain, joint swelling, arthralgias and gait problem.  Skin: Denies pallor, rash and wound.  Neurological: Denies dizziness, seizures, syncope, weakness, light-headedness, numbness and headaches.  Hematological: Denies adenopathy. Easy bruising, personal or family bleeding history  Psychiatric/Behavioral: Denies suicidal ideation, mood changes, confusion, nervousness, sleep disturbance and agitation   Past Medical History:  Diagnosis Date  . Acute bronchitis 09/25/2015  . ACUTE ON CHRONIC DIASTOLIC HEART FAILURE 12/04/2010   Qualifier: Diagnosis of  By: Ladona Ridgel, MD, [facility], Vergia Alcon   . Arthritis    OA RIGHT KNEE WITH PAIN  . Barrett esophagus   . Bradycardia 06/01/2015  . CHF (congestive heart failure) (HCC)   . Chronic respiratory failure (HCC)   . Chronic respiratory failure with hypoxia and hypercapnia (HCC) 02/04/2010   Followed in Pulmonary clinic/ [facility]/ [facility]       - 02 dependent  since 07/02/10 >>  83% RA December 05, 2010       - ONO RA 08/05/12  :  Positive sat < 89 x 2:44m> repeat on 2lpm rec 08/12/2012  - 06/17/2013 reported desat with activity p Knee surgery > rec restart 2lpm with activity  - 06/27/2013   Walked 2lpm  x one lap @ 185 stopped due to sat 88% not sob , desat to 82% on RA just  at th  . COPD III spirometry if use FEV1/VC p saba  07/18/2010   Quit smoking May 2006       - PFT's  04/12/10 FEV1  1.21 (69%) ratio 77 and no change p B2,  DLC0 56%   VC 70%         - PFTs  08/08/2013 FEV1 1.21 (60%) ratio 86 and no change p B2 DLCO 79%  VC 72%  On symbicort 160 2bid  - PFT's   02/08/2018  FEV1 0.70 (40 % ) ratio 56 if use FEV1/VC  p 38 % improvement from saba p symb 160 prior to study with DLCO  78 % corrects to 147  % for alv volume   - 02/08/2018  . Cough variant asthma 02/26/2011   Followed in Pulmonary clinic/ [facility]/ [facility]  - PFT's  06/04/15  FEV1 1.20 (67 % ) ratio 83  p 6 % improvement from saba with DLCO  80 % corrects to 132 % for alv volume      - Clinical dx based on response to symbicort       FENO 09/16/2016  =   96 on symbicort 160 2bid > added singulair  Allergy profile 09/16/2016 >  Eos 0.5 /  IgE  78 neg RAST  -  Referred to rehab 04/29/2017 > completed  . Essential hypertension 04/20/2007   Qualifier: Diagnosis of  By: Marcelyn Ditty RN, Katy Fitch   . GERD (gastroesophageal reflux disease)   . History of ARDS 2006  . History of home oxygen therapy    AT NIGHT WHEN SLEEPING 2 L / MIN NASAL CANNULA  . Hyperlipidemia 07/12/2015  . Hypothyroidism   . Morbid (severe) obesity due to excess calories (HCC) 04/22/2015   pfts with erv 14% 06/04/15  And 33% 02/08/2018   . NSTEMI (non-ST elevated myocardial infarction) (HCC) 05/31/2015  . Pneumococcal pneumonia (HCC) 2006   HOSPITALIZED AND DEVELOPED ARDS  . Psoriatic arthritis (HCC)   . PULMONARY FIBROSIS ILD POST INFLAMMATORY CHRONIC 07/18/2010   Followed as Primary Care Patient/ [facility]/ [facility]  -s/p ARDS 2006 with bacteremic S  Pna       - CT chest 07/03/10 Nonspecific PF mostly upper lobes       - CT chest 12/03/10 acute gg changes and effusions c/w chf - PFT's  02/08/2018  FVC 0.64 (28 %)   with DLCO  78 % corrects to 147 % for alv volume     . Rheumatic disease   . 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 SOB (shortness of breath) on exertion   . Stroke [facility])     Past Surgical History:  Procedure Laterality Date  . ABDOMINOPLASTY    . CARDIAC CATHETERIZATION N/A 06/01/2015   Procedure: Left Heart Cath and Coronary Angiography;  Surgeon: Runell Gess, MD;  Location: [facility] INVASIVE CV LAB;  Service: Cardiovascular;   Laterality: N/A;  . CARPAL TUNNEL RELEASE    . CHOLECYSTECTOMY    . CORONARY ARTERY BYPASS GRAFT N/A 06/04/2015   Procedure: CORONARY ARTERY BYPASS GRAFT times three            with left internal mammary artery and right leg saphenous vein;  Surgeon: Alleen Borne, MD;  Location: [facility] OR;  Service: Open Heart Surgery;  Laterality: N/A;  . cosmetic breast surgery    . JOINT REPLACEMENT    . KNEE ARTHROSCOPY Left   . TEE WITHOUT CARDIOVERSION  06/04/2015   Procedure: TRANSESOPHAGEAL ECHOCARDIOGRAM (TEE);  Surgeon: Alleen Borne, MD;  Location: [facility] OR;  Service: Open Heart Surgery;;  . TOTAL KNEE ARTHROPLASTY Left   . TOTAL KNEE ARTHROPLASTY Right 06/06/2013   Procedure: RIGHT TOTAL KNEE ARTHROPLASTY;  Surgeon: Loanne Drilling, MD;  Location: [facility] ORS;  Service: Orthopedics;  Laterality: Right;    Family History  Problem Relation Age of Onset  . Breast cancer Mother   . Coronary artery disease Father   . Rheum arthritis Father     SOCIAL HX:   reports that she quit smoking about 15 years ago. Her smoking use included cigarettes. She has a 87.75 pack-year smoking history. She has never used smokeless tobacco. She reports that she does not drink alcohol and does not use drugs.   Current Outpatient Medications:  .  acetaminophen (TYLENOL) 650 MG CR tablet, Take 650 mg by mouth 2 (two) times daily., Disp: , Rfl:  .  albuterol (PROVENTIL) (2.5 MG/3ML) 0.083% nebulizer solution, Take 3 mLs (2.5 mg total) by nebulization every 6 (six) hours as needed for wheezing or shortness of breath., Disp: 150 mL, Rfl: 1 .  albuterol (VENTOLIN HFA) 108 (90 Base) MCG/ACT inhaler, Inhale 2 puffs into the lungs every 4 (four) hours as needed (shortness of breath, if you can't catch your breath)., Disp: 8 g, Rfl: 5 .  aspirin 81 MG tablet, Take 1 tablet (81 mg total) by mouth daily., Disp: 30 tablet, Rfl: 0 .  atorvastatin (LIPITOR) 40 MG tablet, TAKE 1 TABLET BY MOUTH  DAILY AT 6 PM. Please schedule appointment for  future refills. Thank you, Disp: 90 tablet, Rfl: 0 .  azithromycin (ZITHROMAX) 250 MG tablet, Take as directed, Disp: 6 tablet, Rfl: 0 .  budesonide (PULMICORT) 0.25 MG/2ML nebulizer solution, One twice daily, Disp: 120 mL, Rfl: 12 .  CALCIUM PO, Take 1 tablet by mouth daily., Disp: , Rfl:  .  Cholecalciferol (VITAMIN D3) 2000 UNITS TABS, Take 2,000 Int'l Units by mouth daily., Disp: , Rfl:  .  citalopram (CELEXA) 40 MG tablet, TAKE 1 TABLET BY MOUTH IN  THE MORNING, Disp: 90 tablet, Rfl: 3 .  cyclobenzaprine (FLEXERIL) 5 MG tablet, TAKE 1 TABLET BY MOUTH AT  BEDTIME AS NEEDED FOR  MUSCLE SPASM(S) (Patient taking differently: Take 5 mg by mouth at bedtime as needed for muscle spasms.), Disp: 90 tablet, Rfl: 0 .  diltiazem (CARDIZEM CD) 120 MG 24 hr capsule, TAKE 1 CAPSULE (120 MG TOTAL) BY MOUTH AT BEDTIME., Disp: 90 capsule, Rfl: 1 .  formoterol (PERFOROMIST) 20 MCG/2ML nebulizer solution, Take 2 mLs (20 mcg total) by nebulization 2 (two) times daily. Use in nebulizer twice daily perfectly regularly, Disp: 120 mL, Rfl: 11 .  furosemide (LASIX) 20 MG tablet, TAKE 1 TABLET BY MOUTH  EVERY OTHER DAY (Patient taking differently: Take 20 mg by mouth every other day.), Disp: 45 tablet, Rfl: 3 .  golimumab (SIMPONI ARIA) 50 MG/4ML SOLN injection, Inject 50 mg into the vein every 8 (eight) weeks. , Disp: , Rfl:  .  leflunomide (ARAVA) 20 MG tablet, Take 1 tablet (20 mg total) by mouth daily., Disp: , Rfl:  .  levothyroxine (SYNTHROID) 125 MCG tablet, TAKE 1 TABLET BY MOUTH EVERY DAY, Disp: 90 tablet, Rfl: 1 .  losartan (COZAAR) 50 MG tablet, TAKE 1 TABLET BY MOUTH  DAILY (Patient taking differently: Take 50 mg by mouth daily.), Disp: 90 tablet, Rfl: 3 .  montelukast (SINGULAIR) 10 MG tablet, TAKE 1 TABLET BY MOUTH AT  BEDTIME, Disp: 90 tablet,  Rfl: 3 .  OXYGEN, Inhale 2 L into the lungs continuous. continuous o2, Disp: , Rfl:  .  pantoprazole (PROTONIX) 40 MG tablet, TAKE 1 TABLET BY MOUTH  DAILY BEFORE  BREAKFAST (Patient taking differently: Take 40 mg by mouth daily.), Disp: 90 tablet, Rfl: 3 .  Tiotropium Bromide Monohydrate (SPIRIVA RESPIMAT) 2.5 MCG/ACT AERS, USE 2 INHALATIONS BY MOUTH  EACH MORNING, Disp: 12 g, Rfl: 4 .  traMADol (ULTRAM) 50 MG tablet, Take 1 tablet (50 mg total) by mouth 2 (two) times daily., Disp: 60 tablet, Rfl: 1 .  triamcinolone cream (KENALOG) 0.1 %, Apply 1 application topically 2 (two) times daily as needed (for psoriasis). , Disp: , Rfl:   EXAM:   VITALS per patient if applicable: O2 sat of 96% on 2 L, heart rate 92  GENERAL: alert, oriented, appears well and in no acute distress, she appears much less short of breath than last week, she is not able to speak in full sentences.  She does not appear tachypneic.  HEENT: atraumatic, conjunttiva clear, no obvious abnormalities on inspection of external nose and ears, wears corrective lenses  NECK: normal movements of the head and neck  LUNGS: on inspection no signs of respiratory distress, breathing rate appears normal, no obvious gross increased work of breathing, gasping or wheezing  CV: no obvious cyanosis  MS: moves all visible extremities without noticeable abnormality  PSYCH/NEURO: pleasant and cooperative, no obvious depression or anxiety, speech and thought processing grossly intact  ASSESSMENT AND PLAN:   COPD exacerbation (HCC)  COVID-19  -She appears improved although still not back to baseline. -She believes she is not sick enough to go to the emergency department but at the same time admits that she is still having shortness of breath and some chest tightness above her baseline (altho improved from last week when she saw me). -I will go ahead and set her up to be seen at the respiratory clinic for an in person assessment. -In the meantime she knows to go to the emergency department if her symptoms progress/worsen.     I discussed the assessment and treatment plan with the patient. The  patient was provided an opportunity to ask questions and all were answered. The patient agreed with the plan and demonstrated an understanding of the instructions.   The patient was advised to call back or seek an in-person evaluation if the symptoms worsen or if the condition fails to improve as anticipated.    Chaya Jan, MD  [facility]

## 2020-11-27 NOTE — Telephone Encounter (Signed)
Yes, change to virtual and I will have her referred for monoclonal antibody infusion.

## 2020-11-27 NOTE — Telephone Encounter (Signed)
Patient is aware and mychart appointment scheduled

## 2020-11-29 ENCOUNTER — Ambulatory Visit (INDEPENDENT_AMBULATORY_CARE_PROVIDER_SITE_OTHER): Payer: Medicare Other | Admitting: Nurse Practitioner

## 2020-11-29 DIAGNOSIS — U071 COVID-19: Secondary | ICD-10-CM

## 2020-11-29 DIAGNOSIS — R000 Tachycardia, unspecified: Secondary | ICD-10-CM

## 2020-11-29 NOTE — Progress Notes (Signed)
@Patient  ID: [name], female    DOB: September 13, 1946, 75 y.o.   MRN: [id number]  Chief Complaint  Patient presents with  . New Patient (Initial Visit)    +1/20, Was seen by PCP in person and virtual was given antibiotics, prednisone and rocephin injection.  Patient is on 2-3L on O2 continuous, stating she is SOB, heart racing she stated she does not feel her heart racing is going based off pulse ox 130-150s    Referring provider: [name], [place]*  75 year old female with history of CHF, stroke, acute on chronic diastolic heart pressure, COPD, hypothyroidism, chronic kidney disease.  HPI   Patient presents today for post COVID care clinic visit.  Patient tested positive for Covid on 11/22/2020.  Patient was seen by her PCP and was given steroid and Rocephin injection.  She was also prescribed azithromycin and a round of prednisone.  Patient is on 2 to 3 L of O2 continuous and was on this before she got COVID.  She is stating that she is still having some shortness of breath although this has improved after medications.  She states that her main concern is her heart racing.  She is actually asymptomatic but while checking her pulse ox she has noticed that her heart rate is running between 130-150 BPM.  EKG in office today shows supraventricular tachycardia.  Patient does have a cardiologist an appointment scheduled for her to see them on 11/30/2020 at 2:40.  We will fax a copy of the EKG over to the office. Denies f/c/s, n/v/d, hemoptysis, PND, chest pain or edema.         No Known Allergies  Immunization History  Administered Date(s) Administered  . Influenza Split 08/04/2011, 09/01/2012, 07/05/2015  . Influenza Whole 09/21/2007, 07/23/2010, 10/03/2016  . Influenza, High Dose Seasonal PF 07/27/2015, 12/24/2017, 08/09/2018, 08/29/2019  . Influenza-Unspecified 10/03/2013, 09/03/2014  . PFIZER(Purple Top)SARS-COV-2 Vaccination 09/03/2020, 10/03/2020  . Pneumococcal  Conjugate-13 04/12/2014  . Pneumococcal Polysaccharide-23 07/27/2015  . Pneumococcal-Unspecified 11/03/2004  . Tdap 08/12/2011    Past Medical History:  Diagnosis Date  . Acute bronchitis 09/25/2015  . ACUTE ON CHRONIC DIASTOLIC HEART FAILURE 12/04/2010   Qualifier: Diagnosis of  By: Ladona Ridgel, MD, [facility], [place] Alcon   . Arthritis    OA RIGHT KNEE WITH PAIN  . Barrett esophagus   . Bradycardia 06/01/2015  . CHF (congestive heart failure) (HCC)   . Chronic respiratory failure (HCC)   . Chronic respiratory failure with hypoxia and hypercapnia (HCC) 02/04/2010   [facility]       - 02 dependent  since 07/02/10 >>  83% RA December 05, 2010       - ONO RA 08/05/12  :  Positive sat < 89 x 2:76m> repeat on 2lpm rec 08/12/2012  - 06/17/2013 reported desat with activity p Knee surgery > rec restart 2lpm with activity  - 06/27/2013   Walked 2lpm  x one lap @ 185 stopped due to sat 88% not sob , desat to 82% on RA just at th  . COPD III spirometry if use FEV1/VC p saba  07/18/2010   Quit smoking May 2006       - PFT's  04/12/10 FEV1  1.21 (69%) ratio 77 and no change p B2,  DLC0 56%   VC 70%         - PFTs  08/08/2013 FEV1 1.21 (60%) ratio 86 and no change p B2 DLCO 79%  VC 72%  On symbicort 160 2bid  - PFT's  02/08/2018  FEV1 0.70 (40 % ) ratio 56 if use FEV1/VC  p 38 % improvement from saba p symb 160 prior to study with DLCO  78 % corrects to 147  % for alv volume   - 02/08/2018  . Cough variant asthma 02/26/2011   Followed in Pulmonary clinic/ [facility]/ [facility]  - PFT's  06/04/15  FEV1 1.20 (67 % ) ratio 83  p 6 % improvement from saba with DLCO  80 % corrects to 132 % for alv volume      - Clinical dx based on response to symbicort       FENO 09/16/2016  =   96 on symbicort 160 2bid > added singulair  Allergy profile 09/16/2016 >  Eos 0.5 /  IgE  78 neg RAST  -  Referred to rehab 04/29/2017 > completed  . Essential hypertension 04/20/2007   Qualifier: Diagnosis of  By:  Marcelyn Ditty RN, Katy Fitch   . GERD (gastroesophageal reflux disease)   . History of ARDS 2006  . History of home oxygen therapy    AT NIGHT WHEN SLEEPING 2 L / MIN NASAL CANNULA  . Hyperlipidemia 07/12/2015  . Hypothyroidism   . Morbid (severe) obesity due to excess calories (HCC) 04/22/2015   pfts with erv 14% 06/04/15  And 33% 02/08/2018   . NSTEMI (non-ST elevated myocardial infarction) (HCC) 05/31/2015  . Pneumococcal pneumonia (HCC) 2006   HOSPITALIZED AND DEVELOPED ARDS  . Psoriatic arthritis (HCC)   . PULMONARY FIBROSIS ILD POST INFLAMMATORY CHRONIC 07/18/2010   Followed as Primary Care Patient/ [facility]/ [facility]  -s/p ARDS 2006 with bacteremic S  Pna       - CT chest 07/03/10 Nonspecific PF mostly upper lobes       - CT chest 12/03/10 acute gg changes and effusions c/w chf - PFT's  02/08/2018  FVC 0.64 (28 %)   with DLCO  78 % corrects to 147 % for alv volume     . Rheumatic disease   . 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 SOB (shortness of breath) on exertion   . Stroke [facility])     Tobacco History: Social History   Tobacco Use  Smoking Status Former Smoker  . Packs/day: 1.50  . Years: 58.50  . Pack years: 87.75  . Types: Cigarettes  . Quit date: 03/03/2005  . Years since quitting: 15.7  Smokeless Tobacco Never Used   Counseling given: Not Answered   Outpatient Encounter Medications as of 11/29/2020  Medication Sig  . acetaminophen (TYLENOL) 650 MG CR tablet Take 650 mg by mouth 2 (two) times daily.  Marland Kitchen albuterol (PROVENTIL) (2.5 MG/3ML) 0.083% nebulizer solution Take 3 mLs (2.5 mg total) by nebulization every 6 (six) hours as needed for wheezing or shortness of breath.  Marland Kitchen albuterol (VENTOLIN HFA) 108 (90 Base) MCG/ACT inhaler Inhale 2 puffs into the lungs every 4 (four) hours as needed (shortness of breath, if you can't catch your breath).  Marland Kitchen aspirin 81 MG tablet Take 1 tablet (81 mg total) by mouth daily.  Marland Kitchen atorvastatin (LIPITOR) 40 MG tablet TAKE 1 TABLET BY MOUTH  DAILY AT 6 PM. Please  schedule appointment for future refills. Thank you  . budesonide (PULMICORT) 0.25 MG/2ML nebulizer solution One twice daily  . CALCIUM PO Take 1 tablet by mouth daily.  . Cholecalciferol (VITAMIN D3) 2000 UNITS TABS Take 2,000 Int'l Units by mouth daily.  . citalopram (CELEXA) 40  MG tablet TAKE 1 TABLET BY MOUTH IN  THE MORNING  . cyclobenzaprine (FLEXERIL) 5 MG tablet TAKE 1 TABLET BY MOUTH AT  BEDTIME AS NEEDED FOR  MUSCLE SPASM(S) (Patient taking differently: Take 5 mg by mouth at bedtime as needed for muscle spasms.)  . diltiazem (CARDIZEM CD) 120 MG 24 hr capsule TAKE 1 CAPSULE (120 MG TOTAL) BY MOUTH AT BEDTIME.  . formoterol (PERFOROMIST) 20 MCG/2ML nebulizer solution Take 2 mLs (20 mcg total) by nebulization 2 (two) times daily. Use in nebulizer twice daily perfectly regularly  . furosemide (LASIX) 20 MG tablet TAKE 1 TABLET BY MOUTH  EVERY OTHER DAY (Patient taking differently: Take 20 mg by mouth every other day.)  . golimumab (SIMPONI ARIA) 50 MG/4ML SOLN injection Inject 50 mg into the vein every 8 (eight) weeks.   Marland Kitchen leflunomide (ARAVA) 20 MG tablet Take 1 tablet (20 mg total) by mouth daily.  Marland Kitchen levothyroxine (SYNTHROID) 125 MCG tablet TAKE 1 TABLET BY MOUTH EVERY DAY  . losartan (COZAAR) 50 MG tablet TAKE 1 TABLET BY MOUTH  DAILY (Patient taking differently: Take 50 mg by mouth daily.)  . montelukast (SINGULAIR) 10 MG tablet TAKE 1 TABLET BY MOUTH AT  BEDTIME  . OXYGEN Inhale 2 L into the lungs continuous. continuous o2  . pantoprazole (PROTONIX) 40 MG tablet TAKE 1 TABLET BY MOUTH  DAILY BEFORE BREAKFAST (Patient taking differently: Take 40 mg by mouth daily.)  . Tiotropium Bromide Monohydrate (SPIRIVA RESPIMAT) 2.5 MCG/ACT AERS USE 2 INHALATIONS BY MOUTH  EACH MORNING  . traMADol (ULTRAM) 50 MG tablet Take 1 tablet (50 mg total) by mouth 2 (two) times daily.  Marland Kitchen triamcinolone cream (KENALOG) 0.1 % Apply 1 application topically 2 (two) times daily as needed (for psoriasis).   .  [DISCONTINUED] azithromycin (ZITHROMAX) 250 MG tablet Take as directed   No facility-administered encounter medications on file as of 11/29/2020.     Review of Systems  Review of Systems  Constitutional: Negative.  Negative for fatigue and fever.  HENT: Negative.   Respiratory: Positive for cough and shortness of breath.   Cardiovascular: Negative.  Negative for chest pain, palpitations and leg swelling.  Gastrointestinal: Negative.   Allergic/Immunologic: Negative.   Neurological: Negative.   Psychiatric/Behavioral: Negative.        Physical Exam  There were no vitals taken for this visit.  Wt Readings from Last 5 Encounters:  11/27/20 192 lb (87.1 kg)  11/22/20 189 lb 9.6 oz (86 kg)  07/02/20 208 lb 3.2 oz (94.4 kg)  06/19/20 207 lb 8 oz (94.1 kg)  06/08/20 204 lb 8 oz (92.8 kg)     Physical Exam Vitals and nursing note reviewed.  Constitutional:      General: She is not in acute distress.    Appearance: She is well-developed and well-nourished.  Cardiovascular:     Rate and Rhythm: Normal rate and regular rhythm.  Pulmonary:     Effort: Pulmonary effort is normal.     Breath sounds: Normal breath sounds.  Musculoskeletal:     Right lower leg: No edema.     Left lower leg: No edema.  Neurological:     Mental Status: She is alert and oriented to person, place, and time.  Psychiatric:        Mood and Affect: Mood and affect and mood normal.        Behavior: Behavior normal.       Imaging: DG Chest 2 View  Result Date: 11/22/2020 CLINICAL  DATA:  COPD exacerbation EXAM: CHEST - 2 VIEW COMPARISON:  06/06/2020, 04/08/2019 CT 03/10/2019 FINDINGS: Post sternotomy changes. Mild diffuse reticular opacity consistent with chronic interstitial changes. No consolidation or effusion. Enlarged cardiomediastinal silhouette. Chronic elevation right diaphragm. No pneumothorax. IMPRESSION: No active cardiopulmonary disease. Cardiomegaly. Mild diffuse coarse chronic  interstitial opacity Electronically Signed   By: Jasmine Pang M.D.   On: 11/22/2020 20:04     Assessment & Plan:   COVID-19 Cough:   Stay well hydrated  Stay active  Deep breathing exercises  May take tylenol or fever or pain   Continue O2   Tachy cardia Elevated troponin:  Appointment scheduled with cardiology tomorrow  EKG: Supraventricular tachycardia with occasional PVC  / HR 153 BPM  Advised son to take patient to the ED if symptoms worsen   Follow up:  Follow up in 2 weeks or sooner if needed      Ivonne Andrew, NP 11/30/2020

## 2020-11-29 NOTE — Patient Instructions (Addendum)
Covid 19 Cough:   Stay well hydrated  Stay active  Deep breathing exercises  May take tylenol or fever or pain   Continue O2   Tachy cardia Elevated troponin:  Appointment scheduled with cardiology tomorrow  EKG: Supraventricular tachycardia with occasional PVC  / HR 153 BPM  Advised son to take patient to the ED if symptoms worsen   Follow up:  Follow up in 2 weeks or sooner if needed

## 2020-11-30 ENCOUNTER — Encounter: Payer: Self-pay | Admitting: Internal Medicine

## 2020-11-30 ENCOUNTER — Ambulatory Visit (INDEPENDENT_AMBULATORY_CARE_PROVIDER_SITE_OTHER): Payer: Medicare Other

## 2020-11-30 ENCOUNTER — Telehealth: Payer: Self-pay | Admitting: *Deleted

## 2020-11-30 ENCOUNTER — Telehealth (INDEPENDENT_AMBULATORY_CARE_PROVIDER_SITE_OTHER): Payer: Medicare Other | Admitting: Internal Medicine

## 2020-11-30 VITALS — BP 126/68 | HR 80 | Temp 97.8°F | Ht 59.0 in | Wt 194.0 lb

## 2020-11-30 DIAGNOSIS — U071 COVID-19: Secondary | ICD-10-CM | POA: Insufficient documentation

## 2020-11-30 DIAGNOSIS — R000 Tachycardia, unspecified: Secondary | ICD-10-CM | POA: Insufficient documentation

## 2020-11-30 DIAGNOSIS — I471 Supraventricular tachycardia: Secondary | ICD-10-CM

## 2020-11-30 NOTE — Telephone Encounter (Signed)
  Patient Consent for Virtual Visit         [name] has provided verbal consent on 11/30/2020 for a virtual visit (video or telephone).   CONSENT FOR VIRTUAL VISIT FOR:  [name]  By participating in this virtual visit I agree to the following:  I hereby voluntarily request, consent and authorize [facility] and its employed or contracted physicians, physician assistants, nurse practitioners or other licensed health care professionals (the Practitioner), to provide me with telemedicine health care services (the "Services") as deemed necessary by the treating Practitioner. I acknowledge and consent to receive the Services by the Practitioner via telemedicine. I understand that the telemedicine visit will involve communicating with the Practitioner through live audiovisual communication technology and the disclosure of certain medical information by electronic transmission. I acknowledge that I have been given the opportunity to request an in-person assessment or other available alternative prior to the telemedicine visit and am voluntarily participating in the telemedicine visit.  I understand that I have the right to withhold or withdraw my consent to the use of telemedicine in the course of my care at any time, without affecting my right to future care or treatment, and that the Practitioner or I may terminate the telemedicine visit at any time. I understand that I have the right to inspect all information obtained and/or recorded in the course of the telemedicine visit and may receive copies of available information for a reasonable fee.  I understand that some of the potential risks of receiving the Services via telemedicine include:  Marland Kitchen Delay or interruption in medical evaluation due to technological equipment failure or disruption; . Information transmitted may not be sufficient (e.g. poor resolution of images) to allow for appropriate medical decision making by the  Practitioner; and/or  . In rare instances, security protocols could fail, causing a breach of personal health information.  Furthermore, I acknowledge that it is my responsibility to provide information about my medical history, conditions and care that is complete and accurate to the best of my ability. I acknowledge that Practitioner's advice, recommendations, and/or decision may be based on factors not within their control, such as incomplete or inaccurate data provided by me or distortions of diagnostic images or specimens that may result from electronic transmissions. I understand that the practice of medicine is not an exact science and that Practitioner makes no warranties or guarantees regarding treatment outcomes. I acknowledge that a copy of this consent can be made available to me via my patient portal [facility] MyChart), or I can request a printed copy by calling the office of [facility].    I understand that my insurance will be billed for this visit.   I have read or had this consent read to me. . I understand the contents of this consent, which adequately explains the benefits and risks of the Services being provided via telemedicine.  . I have been provided ample opportunity to ask questions regarding this consent and the Services and have had my questions answered to my satisfaction. . I give my informed consent for the services to be provided through the use of telemedicine in my medical care

## 2020-11-30 NOTE — Assessment & Plan Note (Signed)
Cough:   Stay well hydrated  Stay active  Deep breathing exercises  May take tylenol or fever or pain   Continue O2   Tachy cardia Elevated troponin:  Appointment scheduled with cardiology tomorrow  EKG: Supraventricular tachycardia with occasional PVC  / HR 153 BPM  Advised son to take patient to the ED if symptoms worsen   Follow up:  Follow up in 2 weeks or sooner if needed

## 2020-11-30 NOTE — Patient Instructions (Addendum)
Medication Instructions:  Your physician recommends that you continue on your current medications as directed. Please refer to the Current Medication list given to you today.  *If you need a refill on your cardiac medications before your next appointment, please call your pharmacy*   Testing/Procedures:  ZIO XT- Long Term Monitor Instructions   Your physician has requested you wear your ZIO patch monitor 14 days.   This is a single patch monitor.  Irhythm supplies one patch monitor per enrollment.  Additional stickers are not available.   Please do not apply patch if you will be having a Nuclear Stress Test, Echocardiogram, Cardiac CT, MRI, or Chest Xray during the time frame you would be wearing the monitor. The patch cannot be worn during these tests.  You cannot remove and re-apply the ZIO XT patch monitor.   Your ZIO patch monitor will be sent USPS Priority mail from [facility] directly to your home address. The monitor may also be mailed to a PO BOX if home delivery is not available.   It may take 3-5 days to receive your monitor after you have been enrolled.   Once you have received you monitor, please review enclosed instructions.  Your monitor has already been registered assigning a specific monitor serial # to you.   Applying the monitor   Shave hair from upper left chest.   Hold abrader disc by orange tab.  Rub abrader in 40 strokes over left upper chest as indicated in your monitor instructions.   Clean area with 4 enclosed alcohol pads .  Use all pads to assure are is cleaned thoroughly.  Let dry.   Apply patch as indicated in monitor instructions.  Patch will be place under collarbone on left side of chest with arrow pointing upward.   Rub patch adhesive wings for 2 minutes.Remove white label marked "1".  Remove white label marked "2".  Rub patch adhesive wings for 2 additional minutes.   While looking in a mirror, press and release button in center of patch.  A  small green light will flash 3-4 times .  This will be your only indicator the monitor has been turned on.     Do not shower for the first 24 hours.  You may shower after the first 24 hours.   Press button if you feel a symptom. You will hear a small click.  Record Date, Time and Symptom in the Patient Log Book.   When you are ready to remove patch, follow instructions on last 2 pages of Patient Log Book.  Stick patch monitor onto last page of Patient Log Book.   Place Patient Log Book in [place] box.  Use locking tab on box and tape box closed securely.  The Orange and [employer] has [employer] on it.  Please place in mailbox as soon as possible.  Your physician should have your test results approximately 7 days after the monitor has been mailed back to [facility].   Call [facility] Customer Care at [phone] if you have questions regarding your ZIO XT patch monitor.  Call them immediately if you see an orange light blinking on your monitor.   If your monitor falls off in less than 4 days contact our Monitor department at [phone].  If your monitor becomes loose or falls off after 4 days call [employer] at [phone] for suggestions on securing your monitor.     Follow-Up: Wed. December 26, 2020 at 11:45 in person with Herma Carson [facility] : [facility]

## 2020-11-30 NOTE — Progress Notes (Signed)
Cardiology Office Note:    Date:  11/30/2020   ID:  Darlyne, Schmiesing January 06, 1946, MRN [id number]  PCP:  [name], [name], MD  [facility] Cardiologist:  Tobias Alexander, MD  [facility] Electrophysiologist:  None   CC:  SVT in the setting of COVID-19  History of Present Illness:    Krystalyn Kubota is a 75 y.o. female with a hx of CAD s/p NSTEMI with 3 VD but with preserved EF; COPD and ILD on Home O2 diastolic HF who presents for urgent visit  Patient notes that she is feeling fine, feels her rate racing with exertion and improves with rest.  First noted after having COVD-19 6 days ago.  Recently seen in COVID-19 clinic:  ECG at that time showed SVT with Ashman beat and anterior infarct pattern.Marland KitchenHas been on diltiazem 120 since August without issue.  No chest pain or pressure .  No new SOB and no PND/Orthopnea.  No weight gain or leg swelling.  Patient does not feel palpitations, and her heart rate is incidentally at 153 BPM with activity.    Past Medical History:  Diagnosis Date  . Acute bronchitis 09/25/2015  . ACUTE ON CHRONIC DIASTOLIC HEART FAILURE 12/04/2010   Qualifier: Diagnosis of  By: Ladona Ridgel, MD, [facility], Vergia Alcon   . Arthritis    OA RIGHT KNEE WITH PAIN  . Barrett esophagus   . Bradycardia 06/01/2015  . CHF (congestive heart failure) (HCC)   . Chronic respiratory failure (HCC)   . Chronic respiratory failure with hypoxia and hypercapnia (HCC) 02/04/2010   Followed in Pulmonary clinic/ [facility]/ [facility]       - 02 dependent  since 07/02/10 >>  83% RA December 05, 2010       - ONO RA 08/05/12  :  Positive sat < 89 x 2:30m> repeat on 2lpm rec 08/12/2012  - 06/17/2013 reported desat with activity p Knee surgery > rec restart 2lpm with activity  - 06/27/2013   Walked 2lpm  x one lap @ 185 stopped due to sat 88% not sob , desat to 82% on RA just at th  . COPD III spirometry if use FEV1/VC p saba  07/18/2010   Quit smoking May 2006       - PFT's  04/12/10  FEV1  1.21 (69%) ratio 77 and no change p B2,  DLC0 56%   VC 70%         - PFTs  08/08/2013 FEV1 1.21 (60%) ratio 86 and no change p B2 DLCO 79%  VC 72%  On symbicort 160 2bid  - PFT's  02/08/2018  FEV1 0.70 (40 % ) ratio 56 if use FEV1/VC  p 38 % improvement from saba p symb 160 prior to study with DLCO  78 % corrects to 147  % for alv volume   - 02/08/2018  . Cough variant asthma 02/26/2011   Followed in Pulmonary clinic/ [facility]/ [facility]  - PFT's  06/04/15  FEV1 1.20 (67 % ) ratio 83  p 6 % improvement from saba with DLCO  80 % corrects to 132 % for alv volume      - Clinical dx based on response to symbicort       FENO 09/16/2016  =   96 on symbicort 160 2bid > added singulair  Allergy profile 09/16/2016 >  Eos 0.5 /  IgE  78 neg RAST  -  Referred to rehab 04/29/2017 > completed  . Essential  hypertension 04/20/2007   Qualifier: Diagnosis of  By: Marcelyn Ditty RN, Katy Fitch   . GERD (gastroesophageal reflux disease)   . History of ARDS 2006  . History of home oxygen therapy    AT NIGHT WHEN SLEEPING 2 L / MIN NASAL CANNULA  . Hyperlipidemia 07/12/2015  . Hypothyroidism   . Morbid (severe) obesity due to excess calories (HCC) 04/22/2015   pfts with erv 14% 06/04/15  And 33% 02/08/2018   . NSTEMI (non-ST elevated myocardial infarction) (HCC) 05/31/2015  . Pneumococcal pneumonia (HCC) 2006   HOSPITALIZED AND DEVELOPED ARDS  . Psoriatic arthritis (HCC)   . PULMONARY FIBROSIS ILD POST INFLAMMATORY CHRONIC 07/18/2010   Followed as Primary Care Patient/  Healthcare/ [facility]  -s/p ARDS 2006 with bacteremic S  Pna       - CT chest 07/03/10 Nonspecific PF mostly upper lobes       - CT chest 12/03/10 acute gg changes and effusions c/w chf - PFT's  02/08/2018  FVC 0.64 (28 %)   with DLCO  78 % corrects to 147 % for alv volume     . Rheumatic disease   . 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 SOB (shortness of breath) on exertion   . Stroke [facility])     Past Surgical History:  Procedure Laterality Date  . ABDOMINOPLASTY    . CARDIAC  CATHETERIZATION N/A 06/01/2015   Procedure: Left Heart Cath and Coronary Angiography;  Surgeon: Runell Gess, MD;  Location: [facility] INVASIVE CV LAB;  Service: Cardiovascular;  Laterality: N/A;  . CARPAL TUNNEL RELEASE    . CHOLECYSTECTOMY    . CORONARY ARTERY BYPASS GRAFT N/A 06/04/2015   Procedure: CORONARY ARTERY BYPASS GRAFT times three            with left internal mammary artery and right leg saphenous vein;  Surgeon: Alleen Borne, MD;  Location: [facility] OR;  Service: Open Heart Surgery;  Laterality: N/A;  . cosmetic breast surgery    . JOINT REPLACEMENT    . KNEE ARTHROSCOPY Left   . TEE WITHOUT CARDIOVERSION  06/04/2015   Procedure: TRANSESOPHAGEAL ECHOCARDIOGRAM (TEE);  Surgeon: Alleen Borne, MD;  Location: [facility] OR;  Service: Open Heart Surgery;;  . TOTAL KNEE ARTHROPLASTY Left   . TOTAL KNEE ARTHROPLASTY Right 06/06/2013   Procedure: RIGHT TOTAL KNEE ARTHROPLASTY;  Surgeon: Loanne Drilling, MD;  Location: [facility] ORS;  Service: Orthopedics;  Laterality: Right;    Current Medications: Current Meds  Medication Sig  . acetaminophen (TYLENOL) 650 MG CR tablet Take 650 mg by mouth 2 (two) times daily.  Marland Kitchen albuterol (PROVENTIL) (2.5 MG/3ML) 0.083% nebulizer solution Take 3 mLs (2.5 mg total) by nebulization every 6 (six) hours as needed for wheezing or shortness of breath.  Marland Kitchen albuterol (VENTOLIN HFA) 108 (90 Base) MCG/ACT inhaler Inhale 2 puffs into the lungs every 4 (four) hours as needed (shortness of breath, if you can't catch your breath).  Marland Kitchen amoxicillin (AMOXIL) 500 MG tablet as needed. Dental procedure  . aspirin 81 MG tablet Take 1 tablet (81 mg total) by mouth daily.  Marland Kitchen atorvastatin (LIPITOR) 40 MG tablet TAKE 1 TABLET BY MOUTH  DAILY AT 6 PM. Please schedule appointment for future refills. Thank you  . budesonide (PULMICORT) 0.25 MG/2ML nebulizer solution One twice daily  . CALCIUM PO Take 1 tablet by mouth daily.  . Cholecalciferol (VITAMIN D3) 2000 UNITS TABS Take 2,000 Int'l Units by mouth  daily.  . citalopram (CELEXA) 40 MG tablet  TAKE 1 TABLET BY MOUTH IN  THE MORNING  . cyclobenzaprine (FLEXERIL) 5 MG tablet TAKE 1 TABLET BY MOUTH AT  BEDTIME AS NEEDED FOR  MUSCLE SPASM(S) (Patient taking differently: TAKE 1 TABLET BY MOUTH AT  BEDTIME AS NEEDED FOR  MUSCLE SPASM(S))  . diltiazem (CARDIZEM CD) 120 MG 24 hr capsule TAKE 1 CAPSULE (120 MG TOTAL) BY MOUTH AT BEDTIME.  . formoterol (PERFOROMIST) 20 MCG/2ML nebulizer solution Take 2 mLs (20 mcg total) by nebulization 2 (two) times daily. Use in nebulizer twice daily perfectly regularly  . furosemide (LASIX) 20 MG tablet TAKE 1 TABLET BY MOUTH  EVERY OTHER DAY  . golimumab (SIMPONI ARIA) 50 MG/4ML SOLN injection Inject 50 mg into the vein every 8 (eight) weeks.   Marland Kitchen leflunomide (ARAVA) 20 MG tablet Take 1 tablet (20 mg total) by mouth daily.  Marland Kitchen levothyroxine (SYNTHROID) 125 MCG tablet TAKE 1 TABLET BY MOUTH EVERY DAY  . losartan (COZAAR) 50 MG tablet TAKE 1 TABLET BY MOUTH  DAILY  . montelukast (SINGULAIR) 10 MG tablet TAKE 1 TABLET BY MOUTH AT  BEDTIME  . OXYGEN Inhale 2 L into the lungs continuous. continuous o2  . pantoprazole (PROTONIX) 40 MG tablet TAKE 1 TABLET BY MOUTH  DAILY BEFORE BREAKFAST  . Tiotropium Bromide Monohydrate (SPIRIVA RESPIMAT) 2.5 MCG/ACT AERS USE 2 INHALATIONS BY MOUTH  EACH MORNING  . traMADol (ULTRAM) 50 MG tablet Take 1 tablet (50 mg total) by mouth 2 (two) times daily.  Marland Kitchen triamcinolone cream (KENALOG) 0.1 % Apply 1 application topically 2 (two) times daily as needed (for psoriasis).      Allergies:   Patient has no known allergies.   Social History   Socioeconomic History  . Marital status: Widowed    Spouse name: Not on file  . Number of children: Not on file  . Years of education: Not on file  . Highest education level: Not on file  Occupational History  . Occupation: retired    Associate Professor: Designer, jewellery  Tobacco Use  . Smoking status: Former Smoker    Packs/day: 1.50    Years: 58.50    Pack  years: 87.75    Types: Cigarettes    Quit date: 03/03/2005    Years since quitting: 15.7  . Smokeless tobacco: Never Used  Vaping Use  . Vaping Use: Never used  Substance and Sexual Activity  . Alcohol use: No  . Drug use: No  . Sexual activity: Not on file  Other Topics Concern  . Not on file  Social History Narrative  . Not on file   Social Determinants of Health   Financial Resource Strain: Low Risk   . Difficulty of Paying Living Expenses: Not hard at all  Food Insecurity: Not on file  Transportation Needs: No Transportation Needs  . Lack of Transportation (Medical): No  . Lack of Transportation (Non-Medical): No  Physical Activity: Not on file  Stress: Not on file  Social Connections: Not on file     Family History: The patient's family history includes Breast cancer in her mother; Coronary artery disease in her father; Rheum arthritis in her father.  ROS:   Please see the history of present illness.     All other systems reviewed and are negative.   EKGs/Labs/Other Studies Reviewed:    The following studies were reviewed today:  EKG:   11/29/20:  SVT rate 153 with Ashman beat and anterior infarct pattern  Recent Labs: 01/18/2020: NT-Pro BNP 331 06/07/2020: B Natriuretic  Peptide 152.2 11/22/2020: ALT 31; BUN 32; Creatinine, Ser 1.37; Hemoglobin 12.1; Platelets 231.0; Potassium 4.2; Sodium 139; TSH 1.77  Recent Lipid Panel    Component Value Date/Time   CHOL 164 01/18/2020 1110   CHOL 117 07/12/2015 1508   TRIG 117 01/18/2020 1110   TRIG 75 07/12/2015 1508   HDL 56 01/18/2020 1110   HDL 51 07/12/2015 1508   CHOLHDL 2.9 01/18/2020 1110   CHOLHDL 2.3 08/25/2018 2019   VLDL 22 08/25/2018 2019   LDLCALC 87 01/18/2020 1110   LDLCALC 51 07/12/2015 1508   LDLDIRECT 131.1 08/10/2012 0829    Physical Exam:    VS:  BP 126/68   Pulse 80   Temp 97.8 F (36.6 C)   Ht  (1.499 m)   Wt 194 lb (88 kg)   SpO2 99%   BMI 39.18 kg/m     Wt Readings from Last  3 Encounters:  11/30/20 194 lb (88 kg)  11/27/20 192 lb (87.1 kg)  11/22/20 189 lb 9.6 oz (86 kg)     GEN: Obese well developed in no acute distress HEENT: Normal, wearing O2 presently by [place] NECK: No JVD; No carotid bruits RESPIRATORY:  Tachypnea MUSCULOSKELETAL:  No edema; No deformity  SKIN: Warm and dry  Patient was visualized walking from kitchen to front door:  With this heart rate went from 89-> 149. Left arm Wrist cuff:  10/72.  Heart returned to 72 after resting.  ASSESSMENT AND PLAN:  SVT HTN- controlled COPD on Home O2 with ILD COVID-19 - will obtain 14-day non live heart monitor (ZioPatch) - AV Nodal Therapy: Defer BB in the setting of significant COPD; will continue diltiazem 120 mg Po Daily; if persistently elevated HR with symptoms, can increase to 240 mg Po Daily dose - AAD:  Not a candidate for 1c agents and given lung pathology would not pursue amiodarone - EP evaluation:  Low threshold for EP evaluation  21 day+  follow up with APP team or Dr. Delton See unless new symptoms or abnormal test results warranting change in plan   Medication Adjustments/Labs and Tests Ordered: Current medicines are reviewed at length with the patient today.  Concerns regarding medicines are outlined above.  Orders Placed This Encounter  Procedures  . LONG TERM MONITOR (3-14 DAYS)   No orders of the defined types were placed in this encounter.   Patient Instructions  Medication Instructions:  Your physician recommends that you continue on your current medications as directed. Please refer to the Current Medication list given to you today.  *If you need a refill on your cardiac medications before your next appointment, please call your pharmacy*   Testing/Procedures:  ZIO XT- Long Term Monitor Instructions   Your physician has requested you wear your ZIO patch monitor 14 days.   This is a single patch monitor.  Irhythm supplies one patch monitor per enrollment.  Additional  stickers are not available.   Please do not apply patch if you will be having a Nuclear Stress Test, Echocardiogram, Cardiac CT, MRI, or Chest Xray during the time frame you would be wearing the monitor. The patch cannot be worn during these tests.  You cannot remove and re-apply the ZIO XT patch monitor.   Your ZIO patch monitor will be sent USPS Priority mail from [facility] directly to your home address. The monitor may also be mailed to a PO BOX if home delivery is not available.   It may take 3-5 days to receive  your monitor after you have been enrolled.   Once you have received you monitor, please review enclosed instructions.  Your monitor has already been registered assigning a specific monitor serial # to you.   Applying the monitor   Shave hair from upper left chest.   Hold abrader disc by orange tab.  Rub abrader in 40 strokes over left upper chest as indicated in your monitor instructions.   Clean area with 4 enclosed alcohol pads .  Use all pads to assure are is cleaned thoroughly.  Let dry.   Apply patch as indicated in monitor instructions.  Patch will be place under collarbone on left side of chest with arrow pointing upward.   Rub patch adhesive wings for 2 minutes.Remove white label marked "1".  Remove white label marked "2".  Rub patch adhesive wings for 2 additional minutes.   While looking in a mirror, press and release button in center of patch.  A small green light will flash 3-4 times .  This will be your only indicator the monitor has been turned on.     Do not shower for the first 24 hours.  You may shower after the first 24 hours.   Press button if you feel a symptom. You will hear a small click.  Record Date, Time and Symptom in the Patient Log Book.   When you are ready to remove patch, follow instructions on last 2 pages of Patient Log Book.  Stick patch monitor onto last page of Patient Log Book.   Place Patient Log Book in [place] box.  Use locking  tab on box and tape box closed securely.  The [employer] and [employer] has [employer] on it.  Please place in mailbox as soon as possible.  Your physician should have your test results approximately 7 days after the monitor has been mailed back to [facility].   Call [facility] Customer Care at [phone] if you have questions regarding your ZIO XT patch monitor.  Call them immediately if you see an orange light blinking on your monitor.   If your monitor falls off in less than 4 days contact our Monitor department at [phone].  If your monitor becomes loose or falls off after 4 days call [employer] at [phone] for suggestions on securing your monitor.     Follow-Up: Wed. December 26, 2020 at 11:45 in person with Herma Carson [facility]            Signed, Christell Constant, MD  11/30/2020 4:15 PM    [facility]

## 2020-12-05 ENCOUNTER — Telehealth: Payer: Self-pay | Admitting: Cardiology

## 2020-12-05 NOTE — Telephone Encounter (Signed)
Pt is calling to let Dr. Izora Ribas know that she is still experiencing elevated HR. Pt states she is taking her diltiazem 120 po daily. Pt states Dr. Izora Ribas advised for her to call our office back as needed, if her HR was persistently staying elevated, to have her med increased.  Pt reports her HR is running in the 120s -140s at times. Pt states she had a virtual visit with Dr. Izora Ribas last Friday for follow-up of tachycardia due to current covid infection.  Pt had a virtual done where she was ordered a zio patch to wear for 14 days.  Zio has not been delivered to her yet, for it does take 5-7 business days to come. Pt was enrolled last Friday, so her monitor should be arriving by the end of the week, early next week. Pts next follow-up appt with [place] is on 2/23, in the office with Herma Carson PA-C.  Pt wanted to ask Dr. Izora Ribas if she should go up to the next dose of her Diltiazem, for continued symptomatic elevated HR.  Pt states she is at home resting at this time, and is in no acute distress.  She complaints of no chest pain, N/V, diaphoresis, dizziness, pre-syncopal or syncopal episodes.  Pt states she still has a very active cough with covid, but she does state she feels like she is on the mend, other than the continuous elevated HR.  Pt states her PCP and Pulmonologist are all very aware that she has covid, and are also closely following her for recent infection.  Informed the pt that I will route this message to Dr. Izora Ribas to further review and advise.  Informed the pt that we will call her back shortly thereafter, once recommendations are received.  ED precautions provided to the pt, if symptoms were to worsen from her current state, or she develops acute issues.  Pt verbalized understanding and agrees with this plan. Below is Dr. Debby Bud assessment and plan on this pt from last Fridays virtual visit.      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 Delton See unless new symptoms or abnormal test results warranting change in plan

## 2020-12-05 NOTE — Telephone Encounter (Signed)
STAT if HR is under 50 or over 120 (normal HR is 60-100 beats per minute)  1) What is your heart rate?  144 now and sometimes 158  2) Do you have a log of your heart rate readings (document readings)? yes*  3) Do you have any other symptoms?  Shortness of breath, when she stands up after sitting- seem like thing in the house are moving around- pt wants to be seen  Video eo asap please

## 2020-12-06 MED ORDER — DILTIAZEM HCL ER COATED BEADS 240 MG PO CP24
240.0000 mg | ORAL_CAPSULE | Freq: Every day | ORAL | 1 refills | Status: DC
Start: 2020-12-06 — End: 2021-09-11

## 2020-12-06 NOTE — Telephone Encounter (Signed)
---   Message from Christell Constant, MD sent at 12/06/2020 8:07 AM EST -----  Wanted to close the loop:  Would recommend increasing the diltiazem to 240 mg PO daily. Daughter knows to monitor BP. Want to make sure she has a post Covid f/u.Marland Kitchen Thanks for allowing me to me part of this patient's care! Free free to reach out with questions or concerns.  Riley Lam MD     Spoke with the pt and informed her that Dr. Izora Ribas recommends that we increase her cardizem CD to 240 mg po daily, and she should continue monitoring her BP/HR.  Also informed the pt that Dr. Izora Ribas advised that she needs to reach back out to the respiratory/covid clinic and/or her PCP, and schedule a follow-up appt with them in the very near future, for post-covid follow-up. Pt states she called her PCP yesterday and they wanted her to schedule a virtual with them for this, and she was to call them back today, to confirm a good date and time they could do this visit.  Pt states she will be calling their office this morning to obtain this appt.  Confirmed the pharmacy of choice with the pt.  Pt verbalized understanding and agrees with this plan. Pt was more than gracious for all the assistance provided.

## 2020-12-10 ENCOUNTER — Other Ambulatory Visit: Payer: Self-pay | Admitting: Cardiology

## 2020-12-11 ENCOUNTER — Encounter: Payer: Self-pay | Admitting: Nurse Practitioner

## 2020-12-21 DIAGNOSIS — I471 Supraventricular tachycardia: Secondary | ICD-10-CM | POA: Diagnosis not present

## 2020-12-24 ENCOUNTER — Telehealth: Payer: Self-pay

## 2020-12-24 NOTE — Progress Notes (Deleted)
Cardiology Office Note    Date:  12/24/2020   ID:  [name], [name] [birth year], MRN [id number]   PCP:  [name], [name], MD   [facility]  Cardiologist:  Tobias Alexander, MD *** Advanced Practice Provider:  No care team member to display Electrophysiologist:  None  {Press F2 to show EP APP, CHF, sleep or structural heart MD               :[id number]}  { Click here to update then REFRESH NOTE - MD (PCP) or APP (Team Member)  Change PCP Type for MD, Specialty for APP is either Cardiology or Clinical Cardiac Electrophysiology  :[id number]}   No chief complaint on file.   History of Present Illness:  [name] is a 75 y.o. female with a hx of CAD s/p NSTEMI with 3 VD but with preserved EF; COPD and ILD on Home O2 diastolic HF    Patient saw Dr. Izora Ribas 11/30/2020 after being seen in the ED post Covid with SVT with Ashman beat and anterior infarct pattern.  Had been on diltiazem 120 mg which was increased to 240 mg daily after Zio patch showed atrial flutter 37% burden ranging from 65 to 198 bpm August lasting 2 days and 16 hours average heart rate of 135.Marland Kitchen  Beta blocker and amiodarone not used because of significant COPD.  Past Medical History:  Diagnosis Date  . Acute bronchitis 09/25/2015  . ACUTE ON CHRONIC DIASTOLIC HEART FAILURE 12/04/2010   Qualifier: Diagnosis of  By: Ladona Ridgel, MD, [facility], [place]   . Arthritis    OA RIGHT KNEE WITH PAIN  . Barrett esophagus   . Bradycardia 06/01/2015  . CHF (congestive heart failure) (HCC)   . Chronic respiratory failure (HCC)   . Chronic respiratory failure with hypoxia and hypercapnia (HCC) 02/04/2010   Followed in Pulmonary clinic/ [facility]/ [facility]       - 02 dependent  since 07/02/10 >>  83% RA December 05, 2010       - ONO RA 08/05/12  :  Positive sat < 89 x 2:47m> repeat on 2lpm rec 08/12/2012  - 06/17/2013 reported desat with activity p Knee surgery > rec restart 2lpm with  activity  - 06/27/2013   Walked 2lpm  x one lap @ 185 stopped due to sat 88% not sob , desat to 82% on RA just at th  . COPD III spirometry if use FEV1/VC p saba  07/18/2010   Quit smoking May 2006       - PFT's  04/12/10 FEV1  1.21 (69%) ratio 77 and no change p B2,  DLC0 56%   VC 70%         - PFTs  08/08/2013 FEV1 1.21 (60%) ratio 86 and no change p B2 DLCO 79%  VC 72%  On symbicort 160 2bid  - PFT's  02/08/2018  FEV1 0.70 (40 % ) ratio 56 if use FEV1/VC  p 38 % improvement from saba p symb 160 prior to study with DLCO  78 % corrects to 147  % for alv volume   - 02/08/2018  . Cough variant asthma 02/26/2011   Followed in Pulmonary clinic/  [facility]/ [facility]  - PFT's  06/04/15  FEV1 1.20 (67 % ) ratio 83  p 6 % improvement from saba with DLCO  80 % corrects to 132 % for alv volume      - Clinical dx based on response  to symbicort       FENO 09/16/2016  =   96 on symbicort 160 2bid > added singulair  Allergy profile 09/16/2016 >  Eos 0.5 /  IgE  78 neg RAST  -  Referred to rehab 04/29/2017 > completed  . Essential hypertension 04/20/2007   Qualifier: Diagnosis of  By: Marcelyn Ditty RN, Katy Fitch   . GERD (gastroesophageal reflux disease)   . History of ARDS 2006  . History of home oxygen therapy    AT NIGHT WHEN SLEEPING 2 L / MIN NASAL CANNULA  . Hyperlipidemia 07/12/2015  . Hypothyroidism   . Morbid (severe) obesity due to excess calories (HCC) 04/22/2015   pfts with erv 14% 06/04/15  And 33% 02/08/2018   . NSTEMI (non-ST elevated myocardial infarction) (HCC) 05/31/2015  . Pneumococcal pneumonia (HCC) 2006   HOSPITALIZED AND DEVELOPED ARDS  . Psoriatic arthritis (HCC)   . PULMONARY FIBROSIS ILD POST INFLAMMATORY CHRONIC 07/18/2010   Followed as Primary Care Patient/ [facility]/ [facility]  -s/p ARDS 2006 with bacteremic S  Pna       - CT chest 07/03/10 Nonspecific PF mostly upper lobes       - CT chest 12/03/10 acute gg changes and effusions c/w chf - PFT's  02/08/2018  FVC 0.64 (28 %)   with DLCO  78 % corrects to 147  % for alv volume     . Rheumatic disease   . 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 SOB (shortness of breath) on exertion   . Stroke [facility])     Past Surgical History:  Procedure Laterality Date  . ABDOMINOPLASTY    . CARDIAC CATHETERIZATION N/A 06/01/2015   Procedure: Left Heart Cath and Coronary Angiography;  Surgeon: Runell Gess, MD;  Location: [facility] INVASIVE CV LAB;  Service: Cardiovascular;  Laterality: N/A;  . CARPAL TUNNEL RELEASE    . CHOLECYSTECTOMY    . CORONARY ARTERY BYPASS GRAFT N/A 06/04/2015   Procedure: CORONARY ARTERY BYPASS GRAFT times three            with left internal mammary artery and right leg saphenous vein;  Surgeon: Alleen Borne, MD;  Location: [facility] OR;  Service: Open Heart Surgery;  Laterality: N/A;  . cosmetic breast surgery    . JOINT REPLACEMENT    . KNEE ARTHROSCOPY Left   . TEE WITHOUT CARDIOVERSION  06/04/2015   Procedure: TRANSESOPHAGEAL ECHOCARDIOGRAM (TEE);  Surgeon: Alleen Borne, MD;  Location: [facility] OR;  Service: Open Heart Surgery;;  . TOTAL KNEE ARTHROPLASTY Left   . TOTAL KNEE ARTHROPLASTY Right 06/06/2013   Procedure: RIGHT TOTAL KNEE ARTHROPLASTY;  Surgeon: Loanne Drilling, MD;  Location: [facility] ORS;  Service: Orthopedics;  Laterality: Right;    Current Medications: No outpatient medications have been marked as taking for the 12/26/20 encounter (Appointment) with Dyann Kief, PA-C.     Allergies:   Patient has no known allergies.   Social History   Socioeconomic History  . Marital status: Widowed    Spouse name: Not on file  . Number of children: Not on file  . Years of education: Not on file  . Highest education level: Not on file  Occupational History  . Occupation: retired    Associate Professor: Designer, jewellery  Tobacco Use  . Smoking status: Former Smoker    Packs/day: 1.50    Years: 58.50    Pack years: 87.75    Types: Cigarettes    Quit date: 03/03/2005    Years  since quitting: 15.8  . Smokeless tobacco: Never Used  Vaping Use  . Vaping Use: Never  used  Substance and Sexual Activity  . Alcohol use: No  . Drug use: No  . Sexual activity: Not on file  Other Topics Concern  . Not on file  Social History Narrative  . Not on file   Social Determinants of Health   Financial Resource Strain: Low Risk   . Difficulty of Paying Living Expenses: Not hard at all  Food Insecurity: Not on file  Transportation Needs: No Transportation Needs  . Lack of Transportation (Medical): No  . Lack of Transportation (Non-Medical): No  Physical Activity: Not on file  Stress: Not on file  Social Connections: Not on file     Family History:  The patient's ***family history includes Breast cancer in her mother; Coronary artery disease in her father; Rheum arthritis in her father.   ROS:   Please see the history of present illness.    ROS All other systems reviewed and are negative.   PHYSICAL EXAM:   VS:  There were no vitals taken for this visit.  Physical Exam  GEN: Well nourished, well developed, in no acute distress  HEENT: normal  Neck: no JVD, carotid bruits, or masses Cardiac:RRR; no murmurs, rubs, or gallops  Respiratory:  clear to auscultation bilaterally, normal work of breathing GI: soft, nontender, nondistended, + BS Ext: without cyanosis, clubbing, or edema, Good distal pulses bilaterally MS: no deformity or atrophy  Skin: warm and dry, no rash Neuro:  Alert and Oriented x 3, Strength and sensation are intact Psych: euthymic mood, full affect  Wt Readings from Last 3 Encounters:  11/30/20 194 lb (88 kg)  11/27/20 192 lb (87.1 kg)  11/22/20 189 lb 9.6 oz (86 kg)      Studies/Labs Reviewed:   EKG:  EKG is*** ordered today.  The ekg ordered today demonstrates ***  Recent Labs: 01/18/2020: NT-Pro BNP 331 06/07/2020: B Natriuretic Peptide 152.2 11/22/2020: ALT 31; BUN 32; Creatinine, Ser 1.37; Hemoglobin 12.1; Platelets 231.0; Potassium 4.2; Sodium 139; TSH 1.77   Lipid Panel    Component Value Date/Time   CHOL 164  01/18/2020 1110   CHOL 117 07/12/2015 1508   TRIG 117 01/18/2020 1110   TRIG 75 07/12/2015 1508   HDL 56 01/18/2020 1110   HDL 51 07/12/2015 1508   CHOLHDL 2.9 01/18/2020 1110   CHOLHDL 2.3 08/25/2018 2019   VLDL 22 08/25/2018 2019   LDLCALC 87 01/18/2020 1110   LDLCALC 51 07/12/2015 1508   LDLDIRECT 131.1 08/10/2012 0829    Additional studies/ records that were reviewed today include:  ***   Risk Assessment/Calculations:   {Does this patient have ATRIAL FIBRILLATION?:[phone]}     ASSESSMENT:    No diagnosis found.   PLAN:  In order of problems listed above:    Shared Decision Making/Informed Consent   {Are you ordering a CV Procedure (e.g. stress test, cath, DCCV, TEE, etc)?   Press F2        :[id number]}    Medication Adjustments/Labs and Tests Ordered: Current medicines are reviewed at length with the patient today.  Concerns regarding medicines are outlined above.  Medication changes, Labs and Tests ordered today are listed in the Patient Instructions below. There are no Patient Instructions on file for this visit.   Elson Clan, PA-C  12/24/2020 11:15 AM    [facility] [address] [place], [place], [place]  [address] Phone: [phone])  [phone]; Fax: [phone]    

## 2020-12-24 NOTE — Telephone Encounter (Signed)
Outreach made to Pt.  Advised that her HM results showed she was in aflutter 37% of the time.  Per APP have Pt see afib clinic ASAP.  Pt scheduled with afib clinic tomorrow at 3:00 pm 12/25/20.  Directions and parking code given.

## 2020-12-25 ENCOUNTER — Encounter (HOSPITAL_COMMUNITY): Payer: Self-pay | Admitting: Physician Assistant

## 2020-12-25 ENCOUNTER — Other Ambulatory Visit: Payer: Self-pay

## 2020-12-25 ENCOUNTER — Ambulatory Visit (HOSPITAL_BASED_OUTPATIENT_CLINIC_OR_DEPARTMENT_OTHER)
Admission: RE | Admit: 2020-12-25 | Discharge: 2020-12-25 | Disposition: A | Discharge status: Home / Self Care | Payer: Medicare Other | Source: Ambulatory Visit | Attending: Physician Assistant | Admitting: Physician Assistant

## 2020-12-25 VITALS — BP 122/70 | HR 123 | Ht 59.0 in | Wt 202.6 lb

## 2020-12-25 DIAGNOSIS — I4892 Unspecified atrial flutter: Secondary | ICD-10-CM

## 2020-12-25 DIAGNOSIS — I5033 Acute on chronic diastolic (congestive) heart failure: Secondary | ICD-10-CM | POA: Diagnosis not present

## 2020-12-25 DIAGNOSIS — I2510 Atherosclerotic heart disease of native coronary artery without angina pectoris: Secondary | ICD-10-CM | POA: Insufficient documentation

## 2020-12-25 DIAGNOSIS — E669 Obesity, unspecified: Secondary | ICD-10-CM | POA: Insufficient documentation

## 2020-12-25 DIAGNOSIS — J449 Chronic obstructive pulmonary disease, unspecified: Secondary | ICD-10-CM | POA: Insufficient documentation

## 2020-12-25 DIAGNOSIS — J189 Pneumonia, unspecified organism: Secondary | ICD-10-CM | POA: Diagnosis not present

## 2020-12-25 DIAGNOSIS — I110 Hypertensive heart disease with heart failure: Secondary | ICD-10-CM | POA: Insufficient documentation

## 2020-12-25 DIAGNOSIS — I252 Old myocardial infarction: Secondary | ICD-10-CM | POA: Insufficient documentation

## 2020-12-25 DIAGNOSIS — I248 Other forms of acute ischemic heart disease: Secondary | ICD-10-CM | POA: Diagnosis not present

## 2020-12-25 DIAGNOSIS — Z20822 Contact with and (suspected) exposure to covid-19: Secondary | ICD-10-CM | POA: Diagnosis not present

## 2020-12-25 DIAGNOSIS — Z6841 Body Mass Index (BMI) 40.0 and over, adult: Secondary | ICD-10-CM | POA: Insufficient documentation

## 2020-12-25 DIAGNOSIS — J811 Chronic pulmonary edema: Secondary | ICD-10-CM | POA: Diagnosis not present

## 2020-12-25 DIAGNOSIS — Z7951 Long term (current) use of inhaled steroids: Secondary | ICD-10-CM | POA: Insufficient documentation

## 2020-12-25 DIAGNOSIS — J9621 Acute and chronic respiratory failure with hypoxia: Secondary | ICD-10-CM | POA: Diagnosis not present

## 2020-12-25 DIAGNOSIS — Z87891 Personal history of nicotine dependence: Secondary | ICD-10-CM | POA: Insufficient documentation

## 2020-12-25 DIAGNOSIS — I4891 Unspecified atrial fibrillation: Secondary | ICD-10-CM | POA: Diagnosis not present

## 2020-12-25 DIAGNOSIS — I4510 Unspecified right bundle-branch block: Secondary | ICD-10-CM | POA: Insufficient documentation

## 2020-12-25 DIAGNOSIS — I5032 Chronic diastolic (congestive) heart failure: Secondary | ICD-10-CM | POA: Insufficient documentation

## 2020-12-25 DIAGNOSIS — I517 Cardiomegaly: Secondary | ICD-10-CM | POA: Diagnosis not present

## 2020-12-25 DIAGNOSIS — I130 Hypertensive heart and chronic kidney disease with heart failure and stage 1 through stage 4 chronic kidney disease, or unspecified chronic kidney disease: Secondary | ICD-10-CM | POA: Diagnosis not present

## 2020-12-25 DIAGNOSIS — N179 Acute kidney failure, unspecified: Secondary | ICD-10-CM | POA: Diagnosis not present

## 2020-12-25 DIAGNOSIS — J441 Chronic obstructive pulmonary disease with (acute) exacerbation: Secondary | ICD-10-CM | POA: Diagnosis not present

## 2020-12-25 DIAGNOSIS — D6869 Other thrombophilia: Secondary | ICD-10-CM

## 2020-12-25 DIAGNOSIS — E785 Hyperlipidemia, unspecified: Secondary | ICD-10-CM | POA: Insufficient documentation

## 2020-12-25 DIAGNOSIS — Z66 Do not resuscitate: Secondary | ICD-10-CM | POA: Diagnosis not present

## 2020-12-25 DIAGNOSIS — R0602 Shortness of breath: Secondary | ICD-10-CM | POA: Diagnosis not present

## 2020-12-25 DIAGNOSIS — J9622 Acute and chronic respiratory failure with hypercapnia: Secondary | ICD-10-CM | POA: Diagnosis not present

## 2020-12-25 DIAGNOSIS — E872 Acidosis: Secondary | ICD-10-CM | POA: Diagnosis not present

## 2020-12-25 DIAGNOSIS — J9811 Atelectasis: Secondary | ICD-10-CM | POA: Diagnosis not present

## 2020-12-25 DIAGNOSIS — Z951 Presence of aortocoronary bypass graft: Secondary | ICD-10-CM | POA: Insufficient documentation

## 2020-12-25 DIAGNOSIS — J90 Pleural effusion, not elsewhere classified: Secondary | ICD-10-CM | POA: Diagnosis not present

## 2020-12-25 DIAGNOSIS — K449 Diaphragmatic hernia without obstruction or gangrene: Secondary | ICD-10-CM | POA: Diagnosis not present

## 2020-12-25 DIAGNOSIS — J810 Acute pulmonary edema: Secondary | ICD-10-CM | POA: Diagnosis not present

## 2020-12-25 MED ORDER — DILTIAZEM HCL ER COATED BEADS 120 MG PO CP24: 120.0000 mg | ORAL_CAPSULE | Freq: Every day | ORAL | 3 refills | Status: DC

## 2020-12-25 MED ORDER — APIXABAN 5 MG PO TABS
5.0000 mg | ORAL_TABLET | Freq: Two times a day (BID) | ORAL | 3 refills | Status: DC
Start: 2020-12-25 — End: 2021-01-28

## 2020-12-25 NOTE — Patient Instructions (Signed)
Stop aspirin  Start Eliquis 5mg  twice a day  Cardizem (Dilitiazem) 240mg  in the morning and 120mg  in the evening.

## 2020-12-25 NOTE — Progress Notes (Signed)
Primary Care Physician: [name], [name], MD Primary Cardiologist: [name] Primary Electrophysiologist: none Referring Physician: Dr Herminio Commons [name] is a 75 y.o. female with a history of CAD s/p NSTEMI and CABG, COPD, HTN, HLD, ILD on Home O2, diastolic HF, and atrial flutter who presents for consultation in the [facility].  The patient was initially diagnosed with atrial flutter on a cardiac monitor 12/24/20 with Zio patch showing both atrial flutter and atrial tachycardia 37% burden with rapid rates. The monitor was placed for symptoms of palpitations. Of note, she was diagnosed with COVID 11/22/20.  She has a CHADS2VASC score of 5. Her diltiazem was increased to 240 mg daily on 2/3 and she reports her palpitations have improved although still present. Patient did have some increased lower extremity edema and SOB. She admits she skipped several doses of her Lasix. She resumed the medication and her symptoms resolved.   Today, she denies symptoms of chest pain, shortness of breath, orthopnea, PND, lower extremity edema, dizziness, presyncope, syncope, snoring, daytime somnolence, bleeding, or neurologic sequela. The patient is tolerating medications without difficulties and is otherwise without complaint today.    Atrial Fibrillation Risk Factors:  she does not have symptoms or diagnosis of sleep apnea. she does not have a history of rheumatic fever.   she has a BMI of Body mass index is 40.92 kg/m.Marland Kitchen Filed Weights   12/25/20 1522  Weight: 91.9 kg    Family History  Problem Relation Age of Onset  . Breast cancer Mother   . Coronary artery disease Father   . Rheum arthritis Father      Atrial Fibrillation Management history:  Previous antiarrhythmic drugs: none Previous cardioversions: none Previous ablations: none CHADS2VASC score: 5 Anticoagulation history: none   Past Medical History:  Diagnosis Date  .  Acute bronchitis 09/25/2015  . ACUTE ON CHRONIC DIASTOLIC HEART FAILURE 12/04/2010   Qualifier: Diagnosis of  By: Ladona Ridgel, MD, [facility], Vergia Alcon   . Arthritis    OA RIGHT KNEE WITH PAIN  . Barrett esophagus   . Bradycardia 06/01/2015  . CHF (congestive heart failure) (HCC)   . Chronic respiratory failure (HCC)   . Chronic respiratory failure with hypoxia and hypercapnia (HCC) 02/04/2010   Followed in Pulmonary clinic/ [facility]/ [facility]       - 02 dependent  since 07/02/10 >>  83% RA December 05, 2010       - ONO RA 08/05/12  :  Positive sat < 89 x 2:86m> repeat on 2lpm rec 08/12/2012  - 06/17/2013 reported desat with activity p Knee surgery > rec restart 2lpm with activity  - 06/27/2013   Walked 2lpm  x one lap @ 185 stopped due to sat 88% not sob , desat to 82% on RA just at th  . COPD III spirometry if use FEV1/VC p saba  07/18/2010   Quit smoking May 2006       - PFT's  04/12/10 FEV1  1.21 (69%) ratio 77 and no change p B2,  DLC0 56%   VC 70%         - PFTs  08/08/2013 FEV1 1.21 (60%) ratio 86 and no change p B2 DLCO 79%  VC 72%  On symbicort 160 2bid  - PFT's  02/08/2018  FEV1 0.70 (40 % ) ratio 56 if use FEV1/VC  p 38 % improvement from saba p symb 160 prior to study with DLCO  78 %  corrects to 147  % for alv volume   - 02/08/2018  . Cough variant asthma 02/26/2011   Followed in Pulmonary clinic/ [facility]/ [facility]  - PFT's  06/04/15  FEV1 1.20 (67 % ) ratio 83  p 6 % improvement from saba with DLCO  80 % corrects to 132 % for alv volume      - Clinical dx based on response to symbicort       FENO 09/16/2016  =   96 on symbicort 160 2bid > added singulair  Allergy profile 09/16/2016 >  Eos 0.5 /  IgE  78 neg RAST  -  Referred to rehab 04/29/2017 > completed  . Essential hypertension 04/20/2007   Qualifier: Diagnosis of  By: Marcelyn Ditty RN, Katy Fitch   . GERD (gastroesophageal reflux disease)   . History of ARDS 2006  . History of home oxygen therapy    AT NIGHT WHEN SLEEPING 2 L / MIN NASAL CANNULA  .  Hyperlipidemia 07/12/2015  . Hypothyroidism   . Morbid (severe) obesity due to excess calories (HCC) 04/22/2015   pfts with erv 14% 06/04/15  And 33% 02/08/2018   . NSTEMI (non-ST elevated myocardial infarction) (HCC) 05/31/2015  . Pneumococcal pneumonia (HCC) 2006   HOSPITALIZED AND DEVELOPED ARDS  . Psoriatic arthritis (HCC)   . PULMONARY FIBROSIS ILD POST INFLAMMATORY CHRONIC 07/18/2010   Followed as Primary Care Patient/ [facility]/ [facility]  -s/p ARDS 2006 with bacteremic S  Pna       - CT chest 07/03/10 Nonspecific PF mostly upper lobes       - CT chest 12/03/10 acute gg changes and effusions c/w chf - PFT's  02/08/2018  FVC 0.64 (28 %)   with DLCO  78 % corrects to 147 % for alv volume     . Rheumatic disease   . 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 SOB (shortness of breath) on exertion   . Stroke [facility])    Past Surgical History:  Procedure Laterality Date  . ABDOMINOPLASTY    . CARDIAC CATHETERIZATION N/A 06/01/2015   Procedure: Left Heart Cath and Coronary Angiography;  Surgeon: Runell Gess, MD;  Location: [facility] INVASIVE CV LAB;  Service: Cardiovascular;  Laterality: N/A;  . CARPAL TUNNEL RELEASE    . CHOLECYSTECTOMY    . CORONARY ARTERY BYPASS GRAFT N/A 06/04/2015   Procedure: CORONARY ARTERY BYPASS GRAFT times three            with left internal mammary artery and right leg saphenous vein;  Surgeon: Alleen Borne, MD;  Location: [facility] OR;  Service: Open Heart Surgery;  Laterality: N/A;  . cosmetic breast surgery    . JOINT REPLACEMENT    . KNEE ARTHROSCOPY Left   . TEE WITHOUT CARDIOVERSION  06/04/2015   Procedure: TRANSESOPHAGEAL ECHOCARDIOGRAM (TEE);  Surgeon: Alleen Borne, MD;  Location: [facility] OR;  Service: Open Heart Surgery;;  . TOTAL KNEE ARTHROPLASTY Left   . TOTAL KNEE ARTHROPLASTY Right 06/06/2013   Procedure: RIGHT TOTAL KNEE ARTHROPLASTY;  Surgeon: Loanne Drilling, MD;  Location: [facility] ORS;  Service: Orthopedics;  Laterality: Right;    Current Outpatient Medications  Medication Sig Dispense  Refill  . acetaminophen (TYLENOL) 650 MG CR tablet Take 650 mg by mouth 2 (two) times daily.    Marland Kitchen albuterol (PROVENTIL) (2.5 MG/3ML) 0.083% nebulizer solution Take 3 mLs (2.5 mg total) by nebulization every 6 (six) hours as needed for wheezing or shortness of breath. 150 mL 1  .  albuterol (VENTOLIN HFA) 108 (90 Base) MCG/ACT inhaler Inhale 2 puffs into the lungs every 4 (four) hours as needed (shortness of breath, if you can't catch your breath). 8 g 5  . amoxicillin (AMOXIL) 500 MG tablet as needed. Dental procedure    . aspirin 81 MG tablet Take 1 tablet (81 mg total) by mouth daily. 30 tablet 0  . atorvastatin (LIPITOR) 40 MG tablet TAKE 1 TABLET BY MOUTH  DAILY AT 6 PM. Please schedule appointment for future refills. Thank you 90 tablet 0  . budesonide (PULMICORT) 0.25 MG/2ML nebulizer solution One twice daily 120 mL 12  . CALCIUM PO Take 1 tablet by mouth daily.    . Cholecalciferol (VITAMIN D3) 2000 UNITS TABS Take 2,000 Int'l Units by mouth daily.    . citalopram (CELEXA) 40 MG tablet TAKE 1 TABLET BY MOUTH IN  THE MORNING 90 tablet 3  . cyclobenzaprine (FLEXERIL) 5 MG tablet TAKE 1 TABLET BY MOUTH AT  BEDTIME AS NEEDED FOR  MUSCLE SPASM(S) (Patient taking differently: No sig reported) 90 tablet 0  . diltiazem (CARDIZEM CD) 240 MG 24 hr capsule Take 1 capsule (240 mg total) by mouth daily. 90 capsule 1  . formoterol (PERFOROMIST) 20 MCG/2ML nebulizer solution Take 2 mLs (20 mcg total) by nebulization 2 (two) times daily. Use in nebulizer twice daily perfectly regularly 120 mL 11  . furosemide (LASIX) 20 MG tablet TAKE 1 TABLET BY MOUTH  EVERY OTHER DAY 45 tablet 3  . golimumab (SIMPONI ARIA) 50 MG/4ML SOLN injection Inject 50 mg into the vein every 8 (eight) weeks.     Marland Kitchen leflunomide (ARAVA) 20 MG tablet Take 1 tablet (20 mg total) by mouth daily.    Marland Kitchen levothyroxine (SYNTHROID) 125 MCG tablet TAKE 1 TABLET BY MOUTH EVERY DAY 90 tablet 1  . losartan (COZAAR) 50 MG tablet TAKE 1 TABLET BY MOUTH   DAILY 90 tablet 3  . montelukast (SINGULAIR) 10 MG tablet TAKE 1 TABLET BY MOUTH AT  BEDTIME 90 tablet 3  . OXYGEN Inhale 2 L into the lungs continuous. continuous o2    . pantoprazole (PROTONIX) 40 MG tablet TAKE 1 TABLET BY MOUTH  DAILY BEFORE BREAKFAST 90 tablet 3  . Tiotropium Bromide Monohydrate (SPIRIVA RESPIMAT) 2.5 MCG/ACT AERS USE 2 INHALATIONS BY MOUTH  EACH MORNING 12 g 4  . traMADol (ULTRAM) 50 MG tablet Take 1 tablet (50 mg total) by mouth 2 (two) times daily. 60 tablet 1  . triamcinolone cream (KENALOG) 0.1 % Apply 1 application topically 2 (two) times daily as needed (for psoriasis).      No current facility-administered medications for this encounter.    No Known Allergies  Social History   Socioeconomic History  . Marital status: Widowed    Spouse name: Not on file  . Number of children: Not on file  . Years of education: Not on file  . Highest education level: Not on file  Occupational History  . Occupation: retired    Associate Professor: Designer, jewellery  Tobacco Use  . Smoking status: Former Smoker    Packs/day: 1.50    Years: 58.50    Pack years: 87.75    Types: Cigarettes    Quit date: 03/03/2005    Years since quitting: 15.8  . Smokeless tobacco: Never Used  Vaping Use  . Vaping Use: Never used  Substance and Sexual Activity  . Alcohol use: No  . Drug use: No  . Sexual activity: Not on file  Other Topics  Concern  . Not on file  Social History Narrative  . Not on file   Social Determinants of Health   Financial Resource Strain: Low Risk   . Difficulty of Paying Living Expenses: Not hard at all  Food Insecurity: Not on file  Transportation Needs: No Transportation Needs  . Lack of Transportation (Medical): No  . Lack of Transportation (Non-Medical): No  Physical Activity: Not on file  Stress: Not on file  Social Connections: Not on file  Intimate Partner Violence: Not on file     ROS- All systems are reviewed and negative except as per the HPI  above.  Physical Exam: Vitals:   12/25/20 1522  BP: 122/70  Pulse: (!) 123  Weight: 91.9 kg  Height: 4\' 11"  (1.499 m)    GEN- The patient is well appearing obese female, alert and oriented x 3 today.   Head- normocephalic, atraumatic Eyes-  Sclera clear, conjunctiva pink Ears- hearing intact Oropharynx- clear Neck- supple  Lungs- Clear to ausculation bilaterally, diminished breath sounds, normal work of breathing, nasal canula O2. Heart- irregular rate and rhythm, tachycardia, no murmurs, rubs or gallops  GI- soft, NT, ND, + BS Extremities- no clubbing, cyanosis, or edema MS- no significant deformity or atrophy Skin- no rash or lesion Psych- euthymic mood, full affect Neuro- strength and sensation are intact  Wt Readings from Last 3 Encounters:  12/25/20 91.9 kg  11/30/20 88 kg  11/27/20 87.1 kg    EKG today demonstrates  Atrial flutter with variable block Vent. rate 123 BPM QRS duration 94 ms QT/QTc 306/438 ms  Echo 08/26/18 demonstrated  Left ventricle: The cavity size was normal. There was mild  concentric hypertrophy. Systolic function was vigorous. The  estimated ejection fraction was in the range of 65% to 70%. Wall  motion was normal; there were no regional wall motion  abnormalities. Doppler parameters are consistent with abnormal  left ventricular relaxation (grade 1 diastolic dysfunction).  - Mitral valve: Calcified annulus. Valve area by pressure  half-time: 2.18 cm^2.  - Left atrium: The atrium was mildly dilated.   Epic records are reviewed at length today  CHA2DS2-VASc Score = 5  The patient's score is based upon: CHF History: Yes HTN History: Yes Diabetes History: No Stroke History: No Vascular Disease History: Yes Age Score: 1 Gender Score: 1      ASSESSMENT AND PLAN: 1. Atrial flutter The patient's CHA2DS2-VASc score is 5, indicating a 7.2% annual risk of stroke.   General education about atrial flutter provided and  questions answered. We also discussed her stroke risk and the risks and benefits of anticoagulation. Possible arrhythmias related to recent COVID infection. Will start Eliquis 5 mg BID and stop ASA. Will increase diltiazem to 240 mg AM and 120 mg PM for rate control and have her return next week to assess rate control and see if she is persistent.   2. Secondary Hypercoagulable State (ICD10:  D68.69) The patient is at significant risk for stroke/thromboembolism based upon her CHA2DS2-VASc Score of 5.  Start Apixaban (Eliquis).   3. Obesity Body mass index is 40.92 kg/m. Lifestyle modification was discussed at length including regular exercise and weight reduction.  4. CAD S/p CABG No anginal symptoms.  5. HTN Stable, med changes as above.  6. Chronic diastolic CHF Lower extremity edema resolved with resumption of Lasix. No signs or symptoms of acute fluid overload today.   Follow up in the AF clinic next week.   08/28/18 PA-C Afib Clinic  [facility] [address] [place], [place] [address] [phone] 12/25/2020 3:37 PM

## 2020-12-26 ENCOUNTER — Ambulatory Visit: Payer: Medicare Other | Admitting: Physician Assistant

## 2020-12-26 DIAGNOSIS — R0602 Shortness of breath: Secondary | ICD-10-CM | POA: Diagnosis not present

## 2020-12-26 DIAGNOSIS — I10 Essential (primary) hypertension: Secondary | ICD-10-CM | POA: Diagnosis not present

## 2020-12-26 DIAGNOSIS — R29810 Facial weakness: Secondary | ICD-10-CM | POA: Diagnosis not present

## 2020-12-26 DIAGNOSIS — I4891 Unspecified atrial fibrillation: Secondary | ICD-10-CM | POA: Diagnosis not present

## 2020-12-26 DIAGNOSIS — R0902 Hypoxemia: Secondary | ICD-10-CM | POA: Diagnosis not present

## 2020-12-26 DIAGNOSIS — R000 Tachycardia, unspecified: Secondary | ICD-10-CM | POA: Diagnosis not present

## 2020-12-27 ENCOUNTER — Inpatient Hospital Stay (HOSPITAL_COMMUNITY): Payer: Medicare Other

## 2020-12-27 ENCOUNTER — Encounter (HOSPITAL_COMMUNITY): Payer: Self-pay | Admitting: Internal Medicine

## 2020-12-27 ENCOUNTER — Emergency Department (HOSPITAL_COMMUNITY): Payer: Medicare Other

## 2020-12-27 ENCOUNTER — Inpatient Hospital Stay (HOSPITAL_COMMUNITY)
Admission: EM | Admit: 2020-12-27 | Discharge: 2020-12-31 | DRG: 291 | Disposition: A | Discharge status: Home Health | Payer: Medicare Other | Attending: Family Medicine | Admitting: Family Medicine

## 2020-12-27 ENCOUNTER — Other Ambulatory Visit: Payer: Self-pay

## 2020-12-27 DIAGNOSIS — Z9981 Dependence on supplemental oxygen: Secondary | ICD-10-CM | POA: Diagnosis not present

## 2020-12-27 DIAGNOSIS — Z66 Do not resuscitate: Secondary | ICD-10-CM | POA: Diagnosis present

## 2020-12-27 DIAGNOSIS — I480 Paroxysmal atrial fibrillation: Secondary | ICD-10-CM | POA: Diagnosis present

## 2020-12-27 DIAGNOSIS — K449 Diaphragmatic hernia without obstruction or gangrene: Secondary | ICD-10-CM | POA: Diagnosis not present

## 2020-12-27 DIAGNOSIS — I4891 Unspecified atrial fibrillation: Secondary | ICD-10-CM | POA: Diagnosis not present

## 2020-12-27 DIAGNOSIS — I501 Left ventricular failure: Secondary | ICD-10-CM | POA: Diagnosis not present

## 2020-12-27 DIAGNOSIS — R32 Unspecified urinary incontinence: Secondary | ICD-10-CM | POA: Diagnosis present

## 2020-12-27 DIAGNOSIS — I130 Hypertensive heart and chronic kidney disease with heart failure and stage 1 through stage 4 chronic kidney disease, or unspecified chronic kidney disease: Secondary | ICD-10-CM | POA: Diagnosis present

## 2020-12-27 DIAGNOSIS — I4892 Unspecified atrial flutter: Secondary | ICD-10-CM | POA: Diagnosis present

## 2020-12-27 DIAGNOSIS — Z79899 Other long term (current) drug therapy: Secondary | ICD-10-CM

## 2020-12-27 DIAGNOSIS — D519 Vitamin B12 deficiency anemia, unspecified: Secondary | ICD-10-CM | POA: Diagnosis present

## 2020-12-27 DIAGNOSIS — J811 Chronic pulmonary edema: Secondary | ICD-10-CM | POA: Diagnosis not present

## 2020-12-27 DIAGNOSIS — D509 Iron deficiency anemia, unspecified: Secondary | ICD-10-CM | POA: Diagnosis present

## 2020-12-27 DIAGNOSIS — L4050 Arthropathic psoriasis, unspecified: Secondary | ICD-10-CM | POA: Diagnosis present

## 2020-12-27 DIAGNOSIS — R4182 Altered mental status, unspecified: Secondary | ICD-10-CM | POA: Diagnosis not present

## 2020-12-27 DIAGNOSIS — R0602 Shortness of breath: Secondary | ICD-10-CM

## 2020-12-27 DIAGNOSIS — E039 Hypothyroidism, unspecified: Secondary | ICD-10-CM | POA: Diagnosis present

## 2020-12-27 DIAGNOSIS — R778 Other specified abnormalities of plasma proteins: Secondary | ICD-10-CM | POA: Diagnosis present

## 2020-12-27 DIAGNOSIS — Z803 Family history of malignant neoplasm of breast: Secondary | ICD-10-CM

## 2020-12-27 DIAGNOSIS — J9811 Atelectasis: Secondary | ICD-10-CM | POA: Diagnosis present

## 2020-12-27 DIAGNOSIS — J9621 Acute and chronic respiratory failure with hypoxia: Secondary | ICD-10-CM | POA: Diagnosis present

## 2020-12-27 DIAGNOSIS — I10 Essential (primary) hypertension: Secondary | ICD-10-CM | POA: Diagnosis present

## 2020-12-27 DIAGNOSIS — J189 Pneumonia, unspecified organism: Secondary | ICD-10-CM | POA: Diagnosis not present

## 2020-12-27 DIAGNOSIS — I471 Supraventricular tachycardia: Secondary | ICD-10-CM | POA: Diagnosis present

## 2020-12-27 DIAGNOSIS — M5431 Sciatica, right side: Secondary | ICD-10-CM | POA: Diagnosis present

## 2020-12-27 DIAGNOSIS — E782 Mixed hyperlipidemia: Secondary | ICD-10-CM

## 2020-12-27 DIAGNOSIS — R609 Edema, unspecified: Secondary | ICD-10-CM | POA: Diagnosis not present

## 2020-12-27 DIAGNOSIS — I248 Other forms of acute ischemic heart disease: Secondary | ICD-10-CM | POA: Diagnosis present

## 2020-12-27 DIAGNOSIS — I5031 Acute diastolic (congestive) heart failure: Secondary | ICD-10-CM

## 2020-12-27 DIAGNOSIS — K219 Gastro-esophageal reflux disease without esophagitis: Secondary | ICD-10-CM | POA: Diagnosis present

## 2020-12-27 DIAGNOSIS — J9622 Acute and chronic respiratory failure with hypercapnia: Secondary | ICD-10-CM | POA: Diagnosis present

## 2020-12-27 DIAGNOSIS — Z8249 Family history of ischemic heart disease and other diseases of the circulatory system: Secondary | ICD-10-CM

## 2020-12-27 DIAGNOSIS — N179 Acute kidney failure, unspecified: Secondary | ICD-10-CM | POA: Diagnosis present

## 2020-12-27 DIAGNOSIS — Z951 Presence of aortocoronary bypass graft: Secondary | ICD-10-CM | POA: Diagnosis not present

## 2020-12-27 DIAGNOSIS — Z8719 Personal history of other diseases of the digestive system: Secondary | ICD-10-CM

## 2020-12-27 DIAGNOSIS — I5033 Acute on chronic diastolic (congestive) heart failure: Secondary | ICD-10-CM | POA: Diagnosis present

## 2020-12-27 DIAGNOSIS — Z87891 Personal history of nicotine dependence: Secondary | ICD-10-CM

## 2020-12-27 DIAGNOSIS — J90 Pleural effusion, not elsewhere classified: Secondary | ICD-10-CM | POA: Diagnosis not present

## 2020-12-27 DIAGNOSIS — Z8261 Family history of arthritis: Secondary | ICD-10-CM

## 2020-12-27 DIAGNOSIS — R531 Weakness: Secondary | ICD-10-CM

## 2020-12-27 DIAGNOSIS — J810 Acute pulmonary edema: Secondary | ICD-10-CM | POA: Diagnosis not present

## 2020-12-27 DIAGNOSIS — N1831 Chronic kidney disease, stage 3a: Secondary | ICD-10-CM | POA: Diagnosis present

## 2020-12-27 DIAGNOSIS — R197 Diarrhea, unspecified: Secondary | ICD-10-CM | POA: Diagnosis present

## 2020-12-27 DIAGNOSIS — Z7901 Long term (current) use of anticoagulants: Secondary | ICD-10-CM

## 2020-12-27 DIAGNOSIS — Z6841 Body Mass Index (BMI) 40.0 and over, adult: Secondary | ICD-10-CM | POA: Diagnosis not present

## 2020-12-27 DIAGNOSIS — M5430 Sciatica, unspecified side: Secondary | ICD-10-CM | POA: Diagnosis present

## 2020-12-27 DIAGNOSIS — Z79890 Hormone replacement therapy (postmenopausal): Secondary | ICD-10-CM

## 2020-12-27 DIAGNOSIS — J441 Chronic obstructive pulmonary disease with (acute) exacerbation: Secondary | ICD-10-CM | POA: Diagnosis present

## 2020-12-27 DIAGNOSIS — Z8616 Personal history of COVID-19: Secondary | ICD-10-CM | POA: Diagnosis not present

## 2020-12-27 DIAGNOSIS — K591 Functional diarrhea: Secondary | ICD-10-CM | POA: Diagnosis not present

## 2020-12-27 DIAGNOSIS — J8410 Pulmonary fibrosis, unspecified: Secondary | ICD-10-CM | POA: Diagnosis present

## 2020-12-27 DIAGNOSIS — I252 Old myocardial infarction: Secondary | ICD-10-CM

## 2020-12-27 DIAGNOSIS — I517 Cardiomegaly: Secondary | ICD-10-CM | POA: Diagnosis not present

## 2020-12-27 DIAGNOSIS — I2510 Atherosclerotic heart disease of native coronary artery without angina pectoris: Secondary | ICD-10-CM | POA: Diagnosis present

## 2020-12-27 DIAGNOSIS — E872 Acidosis: Secondary | ICD-10-CM | POA: Diagnosis present

## 2020-12-27 DIAGNOSIS — Z20822 Contact with and (suspected) exposure to covid-19: Secondary | ICD-10-CM | POA: Diagnosis present

## 2020-12-27 DIAGNOSIS — Z8673 Personal history of transient ischemic attack (TIA), and cerebral infarction without residual deficits: Secondary | ICD-10-CM

## 2020-12-27 DIAGNOSIS — Z96653 Presence of artificial knee joint, bilateral: Secondary | ICD-10-CM | POA: Diagnosis present

## 2020-12-27 DIAGNOSIS — Z7951 Long term (current) use of inhaled steroids: Secondary | ICD-10-CM

## 2020-12-27 DIAGNOSIS — R7989 Other specified abnormal findings of blood chemistry: Secondary | ICD-10-CM | POA: Diagnosis present

## 2020-12-27 DIAGNOSIS — E785 Hyperlipidemia, unspecified: Secondary | ICD-10-CM | POA: Diagnosis present

## 2020-12-27 HISTORY — DX: Chronic diastolic (congestive) heart failure: I50.32

## 2020-12-27 HISTORY — DX: Disorder of arteries and arterioles, unspecified: I77.9

## 2020-12-27 HISTORY — DX: Atherosclerotic heart disease of native coronary artery without angina pectoris: I25.10

## 2020-12-27 HISTORY — DX: Chronic kidney disease, stage 3a: N18.31

## 2020-12-27 LAB — SARS CORONAVIRUS 2 (TAT 6-24 HRS): SARS Coronavirus 2: NEGATIVE

## 2020-12-27 LAB — I-STAT VENOUS BLOOD GAS, ED
Acid-Base Excess: 8 mmol/L — ABNORMAL HIGH (ref 0.0–2.0)
Bicarbonate: 37.7 mmol/L — ABNORMAL HIGH (ref 20.0–28.0)
Calcium, Ion: 1.02 mmol/L — ABNORMAL LOW (ref 1.15–1.40)
HCT: 29 % — ABNORMAL LOW (ref 36.0–46.0)
Hemoglobin: 9.9 g/dL — ABNORMAL LOW (ref 12.0–15.0)
O2 Saturation: 99 %
Potassium: 4.9 mmol/L (ref 3.5–5.1)
Sodium: 138 mmol/L (ref 135–145)
TCO2: 40 mmol/L — ABNORMAL HIGH (ref 22–32)
pCO2, Ven: 88.9 mmHg (ref 44.0–60.0)
pH, Ven: 7.236 — ABNORMAL LOW (ref 7.250–7.430)
pO2, Ven: 200 mmHg — ABNORMAL HIGH (ref 32.0–45.0)

## 2020-12-27 LAB — COMPREHENSIVE METABOLIC PANEL
ALT: 24 U/L (ref 0–44)
AST: 25 U/L (ref 15–41)
Albumin: 3.3 g/dL — ABNORMAL LOW (ref 3.5–5.0)
Alkaline Phosphatase: 55 U/L (ref 38–126)
Anion gap: 9 (ref 5–15)
BUN: 34 mg/dL — ABNORMAL HIGH (ref 8–23)
CO2: 32 mmol/L (ref 22–32)
Calcium: 8.9 mg/dL (ref 8.9–10.3)
Chloride: 98 mmol/L (ref 98–111)
Creatinine, Ser: 1.57 mg/dL — ABNORMAL HIGH (ref 0.44–1.00)
GFR, Estimated: 35 mL/min — ABNORMAL LOW (ref >60)
Glucose, Bld: 147 mg/dL — ABNORMAL HIGH (ref 70–99)
Potassium: 4.9 mmol/L (ref 3.5–5.1)
Sodium: 139 mmol/L (ref 135–145)
Total Bilirubin: 0.4 mg/dL (ref 0.3–1.2)
Total Protein: 6.2 g/dL — ABNORMAL LOW (ref 6.5–8.1)

## 2020-12-27 LAB — CBC WITH DIFFERENTIAL/PLATELET
Abs Immature Granulocytes: 0.07 10*3/uL (ref 0.00–0.07)
Basophils Absolute: 0.1 10*3/uL (ref 0.0–0.1)
Basophils Relative: 1 %
Eosinophils Absolute: 0.1 10*3/uL (ref 0.0–0.5)
Eosinophils Relative: 1 %
HCT: 31.6 % — ABNORMAL LOW (ref 36.0–46.0)
Hemoglobin: 8.7 g/dL — ABNORMAL LOW (ref 12.0–15.0)
Immature Granulocytes: 1 %
Lymphocytes Relative: 9 %
Lymphs Abs: 1 10*3/uL (ref 0.7–4.0)
MCH: 27.3 pg (ref 26.0–34.0)
MCHC: 27.5 g/dL — ABNORMAL LOW (ref 30.0–36.0)
MCV: 99.1 fL (ref 80.0–100.0)
Monocytes Absolute: 1.1 10*3/uL — ABNORMAL HIGH (ref 0.1–1.0)
Monocytes Relative: 11 %
Neutro Abs: 8.2 10*3/uL — ABNORMAL HIGH (ref 1.7–7.7)
Neutrophils Relative %: 77 %
Platelets: 213 10*3/uL (ref 150–400)
RBC: 3.19 MIL/uL — ABNORMAL LOW (ref 3.87–5.11)
RDW: 15.8 % — ABNORMAL HIGH (ref 11.5–15.5)
WBC: 10.6 10*3/uL — ABNORMAL HIGH (ref 4.0–10.5)
nRBC: 0 % (ref 0.0–0.2)

## 2020-12-27 LAB — ECHOCARDIOGRAM COMPLETE
AR max vel: 3.54 cm2
AV Area VTI: 3.66 cm2
AV Area mean vel: 2.88 cm2
AV Mean grad: 4 mmHg
AV Peak grad: 5.9 mmHg
Ao pk vel: 1.21 m/s
S' Lateral: 2.8 cm

## 2020-12-27 LAB — I-STAT ARTERIAL BLOOD GAS, ED
Acid-Base Excess: 8 mmol/L — ABNORMAL HIGH (ref 0.0–2.0)
Acid-Base Excess: 10 mmol/L — ABNORMAL HIGH (ref 0.0–2.0)
Bicarbonate: 38.2 mmol/L — ABNORMAL HIGH (ref 20.0–28.0)
Bicarbonate: 37.7 mmol/L — ABNORMAL HIGH (ref 20.0–28.0)
Calcium, Ion: 1.1 mmol/L — ABNORMAL LOW (ref 1.15–1.40)
Calcium, Ion: 1.03 mmol/L — ABNORMAL LOW (ref 1.15–1.40)
HCT: 27 % — ABNORMAL LOW (ref 36.0–46.0)
HCT: 26 % — ABNORMAL LOW (ref 36.0–46.0)
Hemoglobin: 9.2 g/dL — ABNORMAL LOW (ref 12.0–15.0)
Hemoglobin: 8.8 g/dL — ABNORMAL LOW (ref 12.0–15.0)
O2 Saturation: 97 %
O2 Saturation: 97 %
Patient temperature: 98.8
Potassium: 5 mmol/L (ref 3.5–5.1)
Potassium: 4.6 mmol/L (ref 3.5–5.1)
Sodium: 137 mmol/L (ref 135–145)
Sodium: 138 mmol/L (ref 135–145)
TCO2: 41 mmol/L — ABNORMAL HIGH (ref 22–32)
TCO2: 40 mmol/L — ABNORMAL HIGH (ref 22–32)
pCO2 arterial: 93.8 mmHg (ref 32.0–48.0)
pCO2 arterial: 68.1 mmHg (ref 32.0–48.0)
pH, Arterial: 7.219 — ABNORMAL LOW (ref 7.350–7.450)
pH, Arterial: 7.352 (ref 7.350–7.450)
pO2, Arterial: 117 mmHg — ABNORMAL HIGH (ref 83.0–108.0)
pO2, Arterial: 101 mmHg (ref 83.0–108.0)

## 2020-12-27 LAB — I-STAT CHEM 8, ED
BUN: 39 mg/dL — ABNORMAL HIGH (ref 8–23)
Calcium, Ion: 1.07 mmol/L — ABNORMAL LOW (ref 1.15–1.40)
Chloride: 99 mmol/L (ref 98–111)
Creatinine, Ser: 1.6 mg/dL — ABNORMAL HIGH (ref 0.44–1.00)
Glucose, Bld: 141 mg/dL — ABNORMAL HIGH (ref 70–99)
HCT: 29 % — ABNORMAL LOW (ref 36.0–46.0)
Hemoglobin: 9.9 g/dL — ABNORMAL LOW (ref 12.0–15.0)
Potassium: 4.9 mmol/L (ref 3.5–5.1)
Sodium: 138 mmol/L (ref 135–145)
TCO2: 36 mmol/L — ABNORMAL HIGH (ref 22–32)

## 2020-12-27 LAB — TROPONIN I (HIGH SENSITIVITY)
Troponin I (High Sensitivity): 33 ng/L — ABNORMAL HIGH (ref <18)
Troponin I (High Sensitivity): 34 ng/L — ABNORMAL HIGH (ref <18)

## 2020-12-27 LAB — BRAIN NATRIURETIC PEPTIDE: B Natriuretic Peptide: 329.1 pg/mL — ABNORMAL HIGH (ref 0.0–100.0)

## 2020-12-27 LAB — MAGNESIUM: Magnesium: 2 mg/dL (ref 1.7–2.4)

## 2020-12-27 MED ORDER — ONDANSETRON HCL 4 MG/2ML IJ SOLN: 4.0000 mg | Freq: Four times a day (QID) | INTRAMUSCULAR | Status: DC | PRN

## 2020-12-27 MED ORDER — PERFLUTREN LIPID MICROSPHERE
1.0000 mL | INTRAVENOUS | Status: AC | PRN
  Administered 2020-12-27: 4 mL via INTRAVENOUS
  Filled 2020-12-27: qty 10

## 2020-12-27 MED ORDER — SODIUM CHLORIDE 0.9 % IV SOLN
500.0000 mg | INTRAVENOUS | Status: DC
  Administered 2020-12-27 – 2020-12-30 (×4): 500 mg via INTRAVENOUS
  Filled 2020-12-27 (×5): qty 500

## 2020-12-27 MED ORDER — ALBUTEROL SULFATE HFA 108 (90 BASE) MCG/ACT IN AERS
6.0000 | INHALATION_SPRAY | Freq: Once | RESPIRATORY_TRACT | Status: AC
  Administered 2020-12-27: 6 via RESPIRATORY_TRACT
  Filled 2020-12-27: qty 6.7

## 2020-12-27 MED ORDER — BUDESONIDE 0.5 MG/2ML IN SUSP
0.5000 mg | Freq: Two times a day (BID) | RESPIRATORY_TRACT | Status: DC
  Administered 2020-12-27 – 2020-12-31 (×8): 0.5 mg via RESPIRATORY_TRACT
  Filled 2020-12-27 (×11): qty 2

## 2020-12-27 MED ORDER — DILTIAZEM HCL 25 MG/5ML IV SOLN
10.0000 mg | Freq: Once | INTRAVENOUS | Status: AC
  Administered 2020-12-27: 10 mg via INTRAVENOUS
  Filled 2020-12-27: qty 5

## 2020-12-27 MED ORDER — AEROCHAMBER PLUS FLO-VU LARGE MISC
1.0000 | Freq: Once | Status: AC
  Administered 2020-12-27: 1

## 2020-12-27 MED ORDER — TRIAMCINOLONE ACETONIDE 0.1 % EX CREA: 1.0000 "application " | TOPICAL_CREAM | Freq: Two times a day (BID) | CUTANEOUS | Status: DC | PRN

## 2020-12-27 MED ORDER — IPRATROPIUM BROMIDE HFA 17 MCG/ACT IN AERS
2.0000 | INHALATION_SPRAY | Freq: Once | RESPIRATORY_TRACT | Status: AC
  Administered 2020-12-27: 2 via RESPIRATORY_TRACT
  Filled 2020-12-27: qty 12.9

## 2020-12-27 MED ORDER — FUROSEMIDE 10 MG/ML IJ SOLN
40.0000 mg | Freq: Once | INTRAMUSCULAR | Status: AC
  Administered 2020-12-27: 40 mg via INTRAVENOUS
  Filled 2020-12-27: qty 4

## 2020-12-27 MED ORDER — IPRATROPIUM BROMIDE 0.02 % IN SOLN
0.5000 mg | Freq: Three times a day (TID) | RESPIRATORY_TRACT | Status: DC
  Administered 2020-12-27 – 2020-12-30 (×9): 0.5 mg via RESPIRATORY_TRACT
  Filled 2020-12-27 (×9): qty 2.5

## 2020-12-27 MED ORDER — SODIUM CHLORIDE 0.9 % IV SOLN
1.0000 g | INTRAVENOUS | Status: DC
  Administered 2020-12-27 – 2020-12-28 (×2): 1 g via INTRAVENOUS
  Filled 2020-12-27: qty 1
  Filled 2020-12-27: qty 10

## 2020-12-27 MED ORDER — ACETAMINOPHEN 325 MG PO TABS
650.0000 mg | ORAL_TABLET | ORAL | Status: DC | PRN
  Administered 2020-12-30 – 2020-12-31 (×3): 650 mg via ORAL
  Filled 2020-12-27 (×3): qty 2

## 2020-12-27 MED ORDER — APIXABAN 5 MG PO TABS
5.0000 mg | ORAL_TABLET | Freq: Two times a day (BID) | ORAL | Status: DC
  Administered 2020-12-27 – 2020-12-31 (×9): 5 mg via ORAL
  Filled 2020-12-27 (×8): qty 1

## 2020-12-27 MED ORDER — ARFORMOTEROL TARTRATE 15 MCG/2ML IN NEBU
15.0000 ug | INHALATION_SOLUTION | Freq: Two times a day (BID) | RESPIRATORY_TRACT | Status: DC
  Administered 2020-12-27 – 2020-12-31 (×8): 15 ug via RESPIRATORY_TRACT
  Filled 2020-12-27 (×11): qty 2

## 2020-12-27 MED ORDER — MONTELUKAST SODIUM 10 MG PO TABS
10.0000 mg | ORAL_TABLET | Freq: Every day | ORAL | Status: DC
  Administered 2020-12-27 – 2020-12-30 (×4): 10 mg via ORAL
  Filled 2020-12-27 (×5): qty 1

## 2020-12-27 MED ORDER — SODIUM CHLORIDE 0.9% FLUSH: 3.0000 mL | INTRAVENOUS | Status: DC | PRN

## 2020-12-27 MED ORDER — PANTOPRAZOLE SODIUM 40 MG PO TBEC
40.0000 mg | DELAYED_RELEASE_TABLET | Freq: Every day | ORAL | Status: DC
  Administered 2020-12-28 – 2020-12-31 (×4): 40 mg via ORAL
  Filled 2020-12-27 (×4): qty 1

## 2020-12-27 MED ORDER — FUROSEMIDE 10 MG/ML IJ SOLN
40.0000 mg | Freq: Two times a day (BID) | INTRAMUSCULAR | Status: DC
  Administered 2020-12-27 – 2020-12-30 (×6): 40 mg via INTRAVENOUS
  Filled 2020-12-27 (×6): qty 4

## 2020-12-27 MED ORDER — LEVOTHYROXINE SODIUM 25 MCG PO TABS
125.0000 ug | ORAL_TABLET | Freq: Every day | ORAL | Status: DC
  Administered 2020-12-28 – 2020-12-31 (×4): 125 ug via ORAL
  Filled 2020-12-27 (×4): qty 1

## 2020-12-27 MED ORDER — CITALOPRAM HYDROBROMIDE 20 MG PO TABS
40.0000 mg | ORAL_TABLET | Freq: Every day | ORAL | Status: DC
Start: 2020-12-27 — End: 2020-12-31
  Administered 2020-12-28 – 2020-12-31 (×4): 40 mg via ORAL
  Filled 2020-12-27 (×4): qty 2

## 2020-12-27 MED ORDER — LEVALBUTEROL HCL 1.25 MG/0.5ML IN NEBU
1.2500 mg | INHALATION_SOLUTION | Freq: Three times a day (TID) | RESPIRATORY_TRACT | Status: DC
  Administered 2020-12-27 – 2020-12-30 (×9): 1.25 mg via RESPIRATORY_TRACT
  Filled 2020-12-27 (×9): qty 0.5

## 2020-12-27 MED ORDER — SODIUM CHLORIDE 0.9% FLUSH
3.0000 mL | Freq: Two times a day (BID) | INTRAVENOUS | Status: DC
  Administered 2020-12-27 – 2020-12-31 (×9): 3 mL via INTRAVENOUS

## 2020-12-27 MED ORDER — IOHEXOL 350 MG/ML SOLN
80.0000 mL | Freq: Once | INTRAVENOUS | Status: AC | PRN
  Administered 2020-12-27: 80 mL via INTRAVENOUS

## 2020-12-27 MED ORDER — LEFLUNOMIDE 20 MG PO TABS
20.0000 mg | ORAL_TABLET | Freq: Every day | ORAL | Status: DC
  Administered 2020-12-28 – 2020-12-31 (×4): 20 mg via ORAL
  Filled 2020-12-27 (×5): qty 1

## 2020-12-27 MED ORDER — DILTIAZEM HCL ER COATED BEADS 240 MG PO CP24
240.0000 mg | ORAL_CAPSULE | Freq: Every day | ORAL | Status: DC
  Administered 2020-12-27 – 2020-12-28 (×2): 240 mg via ORAL
  Filled 2020-12-27 (×2): qty 1
  Filled 2020-12-27: qty 2

## 2020-12-27 MED ORDER — CYCLOBENZAPRINE HCL 10 MG PO TABS: 5.0000 mg | ORAL_TABLET | Freq: Every evening | ORAL | Status: DC | PRN

## 2020-12-27 MED ORDER — TRAMADOL HCL 50 MG PO TABS
50.0000 mg | ORAL_TABLET | Freq: Two times a day (BID) | ORAL | Status: DC | PRN
  Administered 2020-12-30 – 2020-12-31 (×2): 50 mg via ORAL
  Filled 2020-12-27 (×2): qty 1

## 2020-12-27 MED ORDER — SODIUM CHLORIDE 0.9 % IV SOLN: 250.0000 mL | INTRAVENOUS | Status: DC | PRN

## 2020-12-27 MED ORDER — GUAIFENESIN ER 600 MG PO TB12: 600.0000 mg | ORAL_TABLET | Freq: Two times a day (BID) | ORAL | Status: DC | PRN

## 2020-12-27 MED ORDER — IPRATROPIUM-ALBUTEROL 0.5-2.5 (3) MG/3ML IN SOLN
3.0000 mL | Freq: Four times a day (QID) | RESPIRATORY_TRACT | Status: DC
  Filled 2020-12-27: qty 3

## 2020-12-27 MED ORDER — ATORVASTATIN CALCIUM 40 MG PO TABS
40.0000 mg | ORAL_TABLET | Freq: Every day | ORAL | Status: DC
  Administered 2020-12-27 – 2020-12-31 (×5): 40 mg via ORAL
  Filled 2020-12-27 (×4): qty 1

## 2020-12-27 NOTE — Procedures (Signed)
Echo attempted. Patient heart rate 145 bpm at time of attempt. Nurse stated patient was going to receive medication for heart rate. Will attempt again later as time permits.

## 2020-12-27 NOTE — ED Notes (Signed)
Patient transported to CT 

## 2020-12-27 NOTE — ED Provider Notes (Signed)
[facility] EMERGENCY DEPARTMENT Provider Note  CSN: [id number] Arrival date & time: 12/27/20 0020  Chief Complaint(s) Atrial Fibrillation  HPI [name] is a 75 y.o. female with a past medical history listed below including COPD on 2 to 3 L nasal cannula at home, chronic diastolic heart failure on Lasix.  A flutter/A. fib which was reportedly newly diagnosed here for several days of generalized fatigue.  Worse with activity.  No alleviating factors.  Patient has had difficulty performing her daily ADLs.  Patient was Covid + 2 to 3 weeks ago and since then has been on a downward trend.  Her flutter/A. fib has been more consistent since.  She was started on Eliquis but only started taking it yesterday.  Patient reports chronic intermittent chest discomfort that is unchanged.  She is also endorsing shortness of breath on exertion.  No nausea or vomiting.  No diarrhea.  No abdominal pain.  No other physical complaints.  EMS was actually called out 2 times in the patient's residence.  Initial time was for generalized fatigue and patient was noted to be in A. fib RVR but declined to come to the hospital for evaluation.  At that time she took her home Cardizem.  Several hours later patient's generalized fatigue had not changed prompting EMS call again and her transport to the ED.  HPI  Past Medical History Past Medical History:  Diagnosis Date  . Acute bronchitis 09/25/2015  . ACUTE ON CHRONIC DIASTOLIC HEART FAILURE 12/04/2010   Qualifier: Diagnosis of  By: Ladona Ridgel, MD, [facility], Vergia Alcon   . Arthritis    OA RIGHT KNEE WITH PAIN  . Barrett esophagus   . Bradycardia 06/01/2015  . CHF (congestive heart failure) (HCC)   . Chronic respiratory failure (HCC)   . Chronic respiratory failure with hypoxia and hypercapnia (HCC) 02/04/2010   Followed in Pulmonary clinic/ [facility]/ [facility]       - 02 dependent  since 07/02/10 >>  83% RA December 05, 2010       - ONO RA  08/05/12  :  Positive sat < 89 x 2:20m> repeat on 2lpm rec 08/12/2012  - 06/17/2013 reported desat with activity p Knee surgery > rec restart 2lpm with activity  - 06/27/2013   Walked 2lpm  x one lap @ 185 stopped due to sat 88% not sob , desat to 82% on RA just at th  . COPD III spirometry if use FEV1/VC p saba  07/18/2010   Quit smoking May 2006       - PFT's  04/12/10 FEV1  1.21 (69%) ratio 77 and no change p B2,  DLC0 56%   VC 70%         - PFTs  08/08/2013 FEV1 1.21 (60%) ratio 86 and no change p B2 DLCO 79%  VC 72%  On symbicort 160 2bid  - PFT's  02/08/2018  FEV1 0.70 (40 % ) ratio 56 if use FEV1/VC  p 38 % improvement from saba p symb 160 prior to study with DLCO  78 % corrects to 147  % for alv volume   - 02/08/2018  . Cough variant asthma 02/26/2011   Followed in Pulmonary clinic/ [facility]/ [facility]  - PFT's  06/04/15  FEV1 1.20 (67 % ) ratio 83  p 6 % improvement from saba with DLCO  80 % corrects to 132 % for alv volume      - Clinical dx based on response to  symbicort       FENO 09/16/2016  =   96 on symbicort 160 2bid > added singulair  Allergy profile 09/16/2016 >  Eos 0.5 /  IgE  78 neg RAST  -  Referred to rehab 04/29/2017 > completed  . Essential hypertension 04/20/2007   Qualifier: Diagnosis of  By: Marcelyn Ditty RN, Katy Fitch   . GERD (gastroesophageal reflux disease)   . History of ARDS 2006  . History of home oxygen therapy    AT NIGHT WHEN SLEEPING 2 L / MIN NASAL CANNULA  . Hyperlipidemia 07/12/2015  . Hypothyroidism   . Morbid (severe) obesity due to excess calories (HCC) 04/22/2015   pfts with erv 14% 06/04/15  And 33% 02/08/2018   . NSTEMI (non-ST elevated myocardial infarction) (HCC) 05/31/2015  . Pneumococcal pneumonia (HCC) 2006   HOSPITALIZED AND DEVELOPED ARDS  . Psoriatic arthritis (HCC)   . PULMONARY FIBROSIS ILD POST INFLAMMATORY CHRONIC 07/18/2010   Followed as Primary Care Patient/ [facility]/ [facility]  -s/p ARDS 2006 with bacteremic S  Pna       - CT chest 07/03/10 Nonspecific PF  mostly upper lobes       - CT chest 12/03/10 acute gg changes and effusions c/w chf - PFT's  02/08/2018  FVC 0.64 (28 %)   with DLCO  78 % corrects to 147 % for alv volume     . Rheumatic disease   . 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 SOB (shortness of breath) on exertion   . Stroke [facility])    Patient Active Problem List   Diagnosis Date Noted  . Atrial flutter (HCC) 12/25/2020  . Secondary hypercoagulable state (HCC) 12/25/2020  . Tachycardia 11/30/2020  . COVID-19 11/30/2020  . Stage 3a chronic kidney disease (HCC) 06/07/2020  . Elevated troponin 06/07/2020  . Chronic respiratory failure with hypoxia and hypercapnia (HCC) 04/20/2019  . COPD exacerbation (HCC) 03/10/2019  . Acute on chronic respiratory failure with hypoxia (HCC) 03/10/2019  . Severe episode of recurrent major depressive disorder, without psychotic features (HCC) 11/09/2018  . COPD (chronic obstructive pulmonary disease) (HCC) 11/02/2018  . COPD with acute exacerbation (HCC) 11/01/2018  . CAP (community acquired pneumonia) 10/28/2018  . ICH (intracerebral hemorrhage) (HCC) 08/25/2018  . Hyperlipidemia 07/12/2015  . 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 Bradycardia 06/01/2015  . NSTEMI (non-ST elevated myocardial infarction) (HCC) 05/31/2015  . Morbid (severe) obesity due to excess calories (HCC) 04/22/2015  . Postoperative anemia due to acute blood loss 06/08/2013  . History of home oxygen therapy 06/07/2013  . GERD (gastroesophageal reflux disease) 06/07/2013  . Barrett's esophagus 06/07/2013  . OA (osteoarthritis) of knee 06/06/2013  . ACUTE ON CHRONIC DIASTOLIC HEART FAILURE 12/04/2010  . COPD III spirometry if use FEV1/VC p saba  07/18/2010  . 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 Compression fracture of thoracic vertebra (HCC) 07/02/2010  . Hypothyroidism 04/20/2007  . Essential hypertension 04/20/2007  . PSORIATIC ARTHRITIS 04/20/2007   Home Medication(s) Prior to Admission medications   Medication Sig  Start Date End Date Taking? Authorizing Provider  acetaminophen (TYLENOL) 650 MG CR tablet Take 650 mg by mouth 2 (two) times daily.    [provider]  albuterol (PROVENTIL) (2.5 MG/3ML) 0.083% nebulizer solution Take 3 mLs (2.5 mg total) by nebulization every 6 (six) hours as needed for wheezing or shortness of breath. 06/19/20   [name], [name], MD  albuterol (VENTOLIN HFA) 108 (90 Base) MCG/ACT inhaler Inhale 2 puffs  into the lungs every 4 (four) hours as needed (shortness of breath, if you can't catch your breath). 06/04/20   Nyoka Cowden, MD  amoxicillin (AMOXIL) 500 MG tablet as needed. Dental procedure    [provider]  apixaban (ELIQUIS) 5 MG TABS tablet Take 1 tablet (5 mg total) by mouth 2 (two) times daily. 12/25/20   Fenton, Clint R, PA  atorvastatin (LIPITOR) 40 MG tablet TAKE 1 TABLET BY MOUTH  DAILY AT 6 PM. Please schedule appointment for future refills. Thank you 10/04/20   Lars Masson, MD  budesonide (PULMICORT) 0.25 MG/2ML nebulizer solution One twice daily 07/02/20   Nyoka Cowden, MD  CALCIUM PO Take 1 tablet by mouth daily.    [provider]  Cholecalciferol (VITAMIN D3) 2000 UNITS TABS Take 2,000 Int'l Units by mouth daily.    [provider]  citalopram (CELEXA) 40 MG tablet TAKE 1 TABLET BY MOUTH IN  THE MORNING 08/20/20   [name], [name], MD  cyclobenzaprine (FLEXERIL) 5 MG tablet TAKE 1 TABLET BY MOUTH AT  BEDTIME AS NEEDED FOR  MUSCLE SPASM(S) Patient taking differently: No sig reported 05/01/20   [name], [name], MD  diltiazem (CARDIZEM CD) 120 MG 24 hr capsule Take 1 capsule (120 mg total) by mouth at bedtime. 12/25/20   Fenton, Clint R, PA  diltiazem (CARDIZEM CD) 240 MG 24 hr capsule Take 1 capsule (240 mg total) by mouth daily. 12/06/20   Chandrasekhar, Lafayette Dragon A, MD  formoterol (PERFOROMIST) 20 MCG/2ML nebulizer solution Take 2 mLs (20 mcg total) by nebulization 2 (two) times daily. Use in  nebulizer twice daily perfectly regularly 07/02/20   Nyoka Cowden, MD  furosemide (LASIX) 20 MG tablet TAKE 1 TABLET BY MOUTH  EVERY OTHER DAY 05/30/20   Lars Masson, MD  golimumab (SIMPONI ARIA) 50 MG/4ML SOLN injection Inject 50 mg into the vein every 8 (eight) weeks.     [provider]  leflunomide (ARAVA) 20 MG tablet Take 1 tablet (20 mg total) by mouth daily. 03/15/19   [name], [name], MD  levothyroxine (SYNTHROID) 125 MCG tablet TAKE 1 TABLET BY MOUTH EVERY DAY 06/20/20   [name], [name], MD  losartan (COZAAR) 50 MG tablet TAKE 1 TABLET BY MOUTH  DAILY 10/10/19   Lars Masson, MD  montelukast (SINGULAIR) 10 MG tablet TAKE 1 TABLET BY MOUTH AT  BEDTIME 09/06/20   Nyoka Cowden, MD  OXYGEN Inhale 2 L into the lungs continuous. continuous o2    [provider]  pantoprazole (PROTONIX) 40 MG tablet TAKE 1 TABLET BY MOUTH  DAILY BEFORE BREAKFAST 05/22/20   Nyoka Cowden, MD  Tiotropium Bromide Monohydrate (SPIRIVA RESPIMAT) 2.5 MCG/ACT AERS USE 2 INHALATIONS BY MOUTH  EACH MORNING 08/21/20   Nyoka Cowden, MD  traMADol (ULTRAM) 50 MG tablet Take 1 tablet (50 mg total) by mouth 2 (two) times daily. 01/03/20   Worthy Rancher B, FNP  triamcinolone cream (KENALOG) 0.1 % Apply 1 application topically 2 (two) times daily as needed (for psoriasis).     [provider]  Past Surgical History Past Surgical History:  Procedure Laterality Date  . ABDOMINOPLASTY    . CARDIAC CATHETERIZATION N/A 06/01/2015   Procedure: Left Heart Cath and Coronary Angiography;  Surgeon: Runell Gess, MD;  Location: [facility] INVASIVE CV LAB;  Service: Cardiovascular;  Laterality: N/A;  . CARPAL TUNNEL RELEASE    . CHOLECYSTECTOMY    . CORONARY ARTERY BYPASS GRAFT N/A 06/04/2015   Procedure: CORONARY ARTERY BYPASS GRAFT times three             with left internal mammary artery and right leg saphenous vein;  Surgeon: Alleen Borne, MD;  Location: [facility] OR;  Service: Open Heart Surgery;  Laterality: N/A;  . cosmetic breast surgery    . JOINT REPLACEMENT    . KNEE ARTHROSCOPY Left   . TEE WITHOUT CARDIOVERSION  06/04/2015   Procedure: TRANSESOPHAGEAL ECHOCARDIOGRAM (TEE);  Surgeon: Alleen Borne, MD;  Location: [facility] OR;  Service: Open Heart Surgery;;  . TOTAL KNEE ARTHROPLASTY Left   . TOTAL KNEE ARTHROPLASTY Right 06/06/2013   Procedure: RIGHT TOTAL KNEE ARTHROPLASTY;  Surgeon: Loanne Drilling, MD;  Location: [facility] ORS;  Service: Orthopedics;  Laterality: Right;   Family History Family History  Problem Relation Age of Onset  . Breast cancer Mother   . Coronary artery disease Father   . Rheum arthritis Father     Social History Social History   Tobacco Use  . Smoking status: Former Smoker    Packs/day: 1.50    Years: 58.50    Pack years: 87.75    Types: Cigarettes    Quit date: 03/03/2005    Years since quitting: 15.8  . Smokeless tobacco: Never Used  Vaping Use  . Vaping Use: Never used  Substance Use Topics  . Alcohol use: No  . Drug use: No   Allergies Patient has no known allergies.  Review of Systems Review of Systems All other systems are reviewed and are negative for acute change except as noted in the HPI  Physical Exam Vital Signs  I have reviewed the triage vital signs BP (!) 124/53   Pulse 88   Temp 98.8 F (37.1 C) (Oral)   Resp (!) 21   SpO2 96%   Physical Exam Vitals reviewed.  Constitutional:      General: She is not in acute distress.    Appearance: She is well-developed and well-nourished. She is not diaphoretic.  HENT:     Head: Normocephalic and atraumatic.     Nose: Nose normal.  Eyes:     General: No scleral icterus.       Right eye: No discharge.        Left eye: No discharge.     Extraocular Movements: EOM normal.     Conjunctiva/sclera: Conjunctivae normal.     Pupils: Pupils  are equal, round, and reactive to light.  Cardiovascular:     Rate and Rhythm: Normal rate and regular rhythm.     Heart sounds: No murmur heard. No friction rub. No gallop.   Pulmonary:     Effort: Pulmonary effort is normal. Tachypnea and prolonged expiration present.     Breath sounds: No stridor. Wheezing (diffuse) present. No rales.  Abdominal:     General: There is no distension.     Palpations: Abdomen is soft.     Tenderness: There is no abdominal tenderness.  Musculoskeletal:        General: No tenderness or edema.     Cervical back: Normal range  of motion and neck supple.  Skin:    General: Skin is warm and dry.     Findings: No erythema or rash.  Neurological:     Mental Status: She is alert and oriented to person, place, and time.  Psychiatric:        Mood and Affect: Mood and affect normal.     ED Results and Treatments Labs (all labs ordered are listed, but only abnormal results are displayed) Labs Reviewed  CBC WITH DIFFERENTIAL/PLATELET - Abnormal; Notable for the following components:      Result Value   WBC 10.6 (*)    RBC 3.19 (*)    Hemoglobin 8.7 (*)    HCT 31.6 (*)    MCHC 27.5 (*)    RDW 15.8 (*)    Neutro Abs 8.2 (*)    Monocytes Absolute 1.1 (*)    All other components within normal limits  COMPREHENSIVE METABOLIC PANEL - Abnormal; Notable for the following components:   Glucose, Bld 147 (*)    BUN 34 (*)    Creatinine, Ser 1.57 (*)    Total Protein 6.2 (*)    Albumin 3.3 (*)    GFR, Estimated 35 (*)    All other components within normal limits  BRAIN NATRIURETIC PEPTIDE - Abnormal; Notable for the following components:   B Natriuretic Peptide 329.1 (*)    All other components within normal limits  I-STAT CHEM 8, ED - Abnormal; Notable for the following components:   BUN 39 (*)    Creatinine, Ser 1.60 (*)    Glucose, Bld 141 (*)    Calcium, Ion 1.07 (*)    TCO2 36 (*)    Hemoglobin 9.9 (*)    HCT 29.0 (*)    All other components  within normal limits  I-STAT VENOUS BLOOD GAS, ED - Abnormal; Notable for the following components:   pH, Ven 7.236 (*)    pCO2, Ven 88.9 (*)    pO2, Ven 200.0 (*)    Bicarbonate 37.7 (*)    TCO2 40 (*)    Acid-Base Excess 8.0 (*)    Calcium, Ion 1.02 (*)    HCT 29.0 (*)    Hemoglobin 9.9 (*)    All other components within normal limits  TROPONIN I (HIGH SENSITIVITY) - Abnormal; Notable for the following components:   Troponin I (High Sensitivity) 33 (*)    All other components within normal limits  TROPONIN I (HIGH SENSITIVITY) - Abnormal; Notable for the following components:   Troponin I (High Sensitivity) 34 (*)    All other components within normal limits  SARS CORONAVIRUS 2 (TAT 6-24 HRS)                                                                                                                         EKG  EKG Interpretation  Date/Time:  Thursday December 27 2020 01:40:44 EST Ventricular Rate:  100 PR Interval:    QRS Duration: 86 QT  Interval:  350 QTC Calculation: 383 R Axis:   101 Text Interpretation: Atrial fibrillation Probable RV involvement, suggest recording right precordial leads Confirmed by Drema Pry [phone]) on 12/27/2020 2:15:04 AM      Radiology CT Angio Chest PE W and/or Wo Contrast  Result Date: 12/27/2020 CLINICAL DATA:  75 year old female with increased shortness of breath recently diagnosed with atrial fibrillation. EXAM: CT ANGIOGRAPHY CHEST WITH CONTRAST TECHNIQUE: Multidetector CT imaging of the chest was performed using the standard protocol during bolus administration of intravenous contrast. Multiplanar CT image reconstructions and MIPs were obtained to evaluate the vascular anatomy. CONTRAST:  80mL OMNIPAQUE IOHEXOL 350 MG/ML SOLN COMPARISON:  Chest radiographs 0135 hours today and earlier. CTA 03/10/2019. FINDINGS: Cardiovascular: Adequate contrast bolus timing in the pulmonary arterial tree. Respiratory motion artifact a especially in the  upper lobes. Subsequently, upper lobe pulmonary artery branches are not evaluated beyond the hila. There is no central or hilar pulmonary artery filling defect. Middle lobe and lower lobe pulmonary artery branch detail is better preserved, with no filling defect identified in those branches. Prior CABG. Calcified aortic atherosclerosis. Cardiac size has mildly increased since 2020, mild cardiomegaly. No pericardial effusion. Little contrast in the aorta on this exam. Mediastinum/Nodes: Negative.  No lymphadenopathy. Lungs/Pleura: Small layering right pleural effusion. Small left pleural effusion only at the costophrenic angle. Enhancing left lower lobe atelectasis. Confluent but enhancing airspace opacity in the right lower lobe more resembles atelectasis than lower lobe pneumonia. Atelectatic changes to the major airways which remain patent. There is bilateral upper lobe perihilar and peribronchial ground-glass opacity. Superimposed chronic upper lobe and middle lobe subpleural scarring greater on the right. Upper Abdomen: Absent gallbladder. There is only trace contrast reflux into the hepatic IVC and hepatic veins. Otherwise negative visible liver, spleen, pancreas, adrenal glands, right renal upper pole. Small gastric hiatal hernia redemonstrated. Musculoskeletal: Previous sternotomy. Diffuse cervical spine disc and endplate degeneration with widespread vacuum disc. Mild chronic T4 superior endplate compression fracture is stable. Chronic T7-T8 thoracic spinal stenosis related to bulky disc osteophyte complex. No acute osseous abnormality identified. Review of the MIP images confirms the above findings. IMPRESSION: 1. No pulmonary embolus identified. Upper lobe pulmonary artery branches are obscured by motion. 2. Acute pulmonary edema suspected. Small right greater than left pleural effusions. Confluent but enhancing right lower lobe airspace disease is more compatible with severe atelectasis than Pneumonia.  Superimposed chronic lung scarring. 3. Mild cardiomegaly. Prior CABG. Aortic Atherosclerosis (ICD10-I70.0). 4. Chronic T4 compression fracture, T7-T8 thoracic spinal stenosis. Electronically Signed   By: Odessa Fleming M.D.   On: 12/27/2020 04:59   DG Chest Port 1 View  Result Date: 12/27/2020 CLINICAL DATA:  Worsening shortness of breath. EXAM: PORTABLE CHEST 1 VIEW COMPARISON:  November 22, 2020 FINDINGS: Multiple sternal wires and vascular clips are seen. Moderate to marked severity diffusely increased interstitial lung markings are present. Mild areas of atelectasis and/or infiltrate are also seen within the bilateral lung bases. There is mild, stable elevation of the right hemidiaphragm. Small bilateral pleural effusions are suspected. No pneumothorax is seen. There is moderate to marked severity enlargement of the cardiac silhouette. The visualized skeletal structures are unremarkable. IMPRESSION: 1. Moderate to marked severity interstitial edema with mild bibasilar atelectasis and/or infiltrate. 2. Small bilateral pleural effusions. Electronically Signed   By: Aram Candela M.D.   On: 12/27/2020 01:55    Pertinent labs & imaging results that were available during my care of the patient were reviewed by me and considered in  my medical decision making (see chart for details).  Medications Ordered in ED Medications  albuterol (VENTOLIN HFA) 108 (90 Base) MCG/ACT inhaler 6 puff (6 puffs Inhalation Given 12/27/20 0215)  ipratropium (ATROVENT HFA) inhaler 2 puff (2 puffs Inhalation Given 12/27/20 0216)  AeroChamber Plus Flo-Vu Large MISC 1 each (1 each Other Given 12/27/20 0216)  iohexol (OMNIPAQUE) 350 MG/ML injection 80 mL (80 mLs Intravenous Contrast Given 12/27/20 0418)  furosemide (LASIX) injection 40 mg (40 mg Intravenous Given 12/27/20 0527)                                                                                                                                    Procedures .1-3 Lead EKG  Interpretation Performed by: Nira Conn, MD Authorized by: Nira Conn, MD     Interpretation: abnormal     ECG rate:  92   ECG rate assessment: normal     Rhythm: atrial fibrillation     Ectopy: none     Conduction: normal      (including critical care time)  Medical Decision Making / ED Course I have reviewed the nursing notes for this encounter and the patient's prior records (if available in EHR or on provided paperwork).   [name] was evaluated in Emergency Department on 12/27/2020 for the symptoms described in the history of present illness. She was evaluated in the context of the global COVID-19 pandemic, which necessitated consideration that the patient might be at risk for infection with the SARS-CoV-2 virus that causes COVID-19. Institutional protocols and algorithms that pertain to the evaluation of patients at risk for COVID-19 are in a state of rapid change based on information released by regulatory bodies including the CDC and federal and state organizations. These policies and algorithms were followed during the patient's care in the ED.  Work-up is notable for CHF exacerbation with pulmonary edema noted on chest x-ray and CTA.  No evidence of pneumonia or PE. Patient's VBG notable for hypercarbia.  There is no Covid test in the system so another was sent. Saturations are stable on her home 3 L nasal cannula. Rest of the work-up also revealed mild AKI. Stable hemoglobin.  Patient given IV Lasix.  Admitted to medicine for further work-up and management.     Final Clinical Impression(s) / ED Diagnoses Final diagnoses:  Atrial fibrillation [facility])  Pulmonary edema cardiac cause [facility])      This chart was dictated using voice recognition software.  Despite best efforts to proofread,  errors can occur which can change the documentation meaning.   Nira Conn, MD 12/27/20 [phone]

## 2020-12-27 NOTE — Progress Notes (Signed)
Additional history has been obtained that gave concern that the patient may have had strokelike symptoms when EMS had been initially called yesterday afternoon.  Patient has been in atrial fibrillation and high risk of stroke.  Check CT scan of the brain and place on neurochecks.  Consider need of MRI symptoms persist.  However, symptoms of slurred speech, weakness, and altered this could have been related to her hypercapnia.    Addendum: CT scan of the brain without contrast did not show any acute abnormalities.

## 2020-12-27 NOTE — Progress Notes (Signed)
  Echocardiogram 2D Echocardiogram has been performed with Definity.  Gerda Diss 12/27/2020, 3:07 PM

## 2020-12-27 NOTE — Consult Note (Addendum)
Cardiology Consultation:   Patient ID: [name] MRN: [id number]; DOB: Sep 14, 1946  Admit date: 12/27/2020 Date of Consult: 12/27/2020  PCP:   Aspen, [name], MD   [facility]  Cardiologist:  Tobias Alexander, MD / [facility] Advanced Practice Provider:  No care team member to display Electrophysiologist:  None  [id number]}   Patient Profile:   [name] is a 75 y.o. female with a hx of CAD with NSTEMI 05/2015 s/p emergent CABGx3 (LIMA-LAD, SVG-OM1, SVG-RCA), interstitial lung disease / COPD with chronic respiratory failure on 3L home O2, asthma, recent Covid-19 infection 11/2020, chronic diastolic CHF, Barrett's esophagus, bradycardia, CKD stage IIIa, essential HTN, small intracranial hemorrhage 08/2018 (etiology unclear/hypertensive), HLD, hypothyroidism, stroke, morbid obesity, psoriatic arthritis who is being seen today for the evaluation of SOB and atrial flutter at the request of Dr. Katrinka Blazing.  History of Present Illness:   [name] historically follows with Dr. Delton See for CAD s/p NSTEMI with 3V disease and resultant CABG in July 2016 when her husband was critically ill and eventually passed away. She carries diagnosis of bradycardia which occurred during that admission at which time beta blocker was stopped. It looks like this has not occurred and she's since been on diltiazem for quite some time. She also has chronic diastolic CHF with normal LV (2019). In 2019 she had a small intracranial hemorrhage of unclear etiology, but hypertensive at the time. F/u CT 11/2018 showed resolution and she was cleared to resume aspirin. Her functional capacity has been significantly impacted by her pulmonary disease, followed by pulmonology. Previous mention of pulmonary fibrosis in notes but not clear this is considered an active dx. In January she had increased SOB and was started on a prednisone taper. She saw primary care 11/22/20 with worsening  symptoms along with mucus production and was given steroid and Rocephin injection. She was started back on a prednisone taper as well as Z-pack. She came back Covid positive that day. She had been vaccinated previously but not boosted.She was seen in the Covid clinic 11/29/20 and EKG showed what was felt to be SVT with HR 153bpm. She saw Dr. Izora Ribas for a video visit the following day who reviweed her EKG and felt this was SVT with Ashman beat and anterior infarct pattern. She was continued on diltiazem and set up for a Zio which showed she had both paroxysmal atrial tach and atrial flutter present (37% burden), also amongst these had sinus rhythm and PACs as well, average HR 104bpm, range 65-214bpm. Her diltiazem as increased to 240mg  daily. She saw Jorja Loa in the afib clinic 12/25/20. Aspirin was changed to Eliquis. Her palpitations improved but were still present, so diltiazem was further increased to 240mg  QAM and 120mg  QPM. Recent TSH wnl. She had reported missing several doses of Lasix lately.  Her daughter visited her last night and found that she seemed sort of out of it and tremulous. She was having a hard time feeling her fingertips. She was trying to eat gingersnaps and would drop them before they made it to her mouth. EMS was called. Daughter was concerned for stroke (?possible transient facial droop) but when they arrived the patient declined care. Within a few hours while ambulating to the bathroom she had an episode of severe weakness/near collapse onto the bed followed by incontinence and severe dyspnea. EMS run sheet indicates tachycardic in afib with POx 90s on O2. Here in the ED, required 5L O2 to maintain sats then placed  on BiPAP due to hypercarbia by ABG. CXR showed interstitial edema with mild bibasilar atelectasis and/or infiltrate and small bilateral effusions. CT angio showed acute pulmonary edema suspected, small R>L effusions, confluent but enhancing right lower lobe airspace  disease is more compatible with severe atelectasis than pneumonia, superimposed lung scarring, mild cardiomegaly, aortic atherosclerosis, chronic T4 compression Fx and T7-8 spinal stenosis. hsTroponin 33-34. BNP 329. Other lab abnormalities include anemia Hgb ranging 8.7-9.9 (chronic appearing waxing/waning anemia), leukocytosis of 10.6, Cr 1.57 (most recent baseline 1.1-1.3). She's received numerous therapeutics thus far with albuterol/atrovent, 10mg  IV diltiazem, 40mg  IV Lasix, and antibiotics, as well as oral Eliquis and diltiazem 240mg . Her daughter is not sure whether she could have missed some of her home diltiazem as well. She remains on BiPAP at present time with HR 1teens (range 70s-120s), last recorded BP 109/68. No chest pain. A+Ox3.    Past Medical History:  Diagnosis Date  . Acute bronchitis 09/25/2015  . ACUTE ON CHRONIC DIASTOLIC HEART FAILURE 12/04/2010   Qualifier: Diagnosis of  By: , MD, [facility], 02/02/2011   . Arthritis    OA RIGHT KNEE WITH PAIN  . Barrett esophagus   . Bradycardia 06/01/2015  . CHF (congestive heart failure) (HCC)   . Chronic respiratory failure (HCC)   . Chronic respiratory failure with hypoxia and hypercapnia (HCC) 02/04/2010   Followed in Pulmonary clinic/ [facility]/ [facility]       - 02 dependent  since 07/02/10 >>  83% RA December 05, 2010       - ONO RA 08/05/12  :  Positive sat < 89 x 2:11m> repeat on 2lpm rec 08/12/2012  - 06/17/2013 reported desat with activity p Knee surgery > rec restart 2lpm with activity  - 06/27/2013   Walked 2lpm  x one lap @ 185 stopped due to sat 88% not sob , desat to 82% on RA just at th  . COPD III spirometry if use FEV1/VC p saba  07/18/2010   Quit smoking May 2006       - PFT's  04/12/10 FEV1  1.21 (69%) ratio 77 and no change p B2,  DLC0 56%   VC 70%         - PFTs  08/08/2013 FEV1 1.21 (60%) ratio 86 and no change p B2 DLCO 79%  VC 72%  On symbicort 160 2bid  - PFT's  02/08/2018  FEV1 0.70 (40 % ) ratio 56 if use FEV1/VC  p  38 % improvement from saba p symb 160 prior to study with DLCO  78 % corrects to 147  % for alv volume   - 02/08/2018  . Cough variant asthma 02/26/2011   Followed in Pulmonary clinic/ [facility]/ [facility]  - PFT's  06/04/15  FEV1 1.20 (67 % ) ratio 83  p 6 % improvement from saba with DLCO  80 % corrects to 132 % for alv volume      - Clinical dx based on response to symbicort       FENO 09/16/2016  =   96 on symbicort 160 2bid > added singulair  Allergy profile 09/16/2016 >  Eos 0.5 /  IgE  78 neg RAST  -  Referred to rehab 04/29/2017 > completed  . Essential hypertension 04/20/2007   Qualifier: Diagnosis of  By: 09/18/2016 RN, 05/01/2017   . GERD (gastroesophageal reflux disease)   . History of ARDS 2006  . History of home oxygen therapy    AT NIGHT  WHEN SLEEPING 2 L / MIN NASAL CANNULA  . Hyperlipidemia 07/12/2015  . Hypothyroidism   . Morbid (severe) obesity due to excess calories (HCC) 04/22/2015   pfts with erv 14% 06/04/15  And 33% 02/08/2018   . NSTEMI (non-ST elevated myocardial infarction) (HCC) 05/31/2015  . Pneumococcal pneumonia (HCC) 2006   HOSPITALIZED AND DEVELOPED ARDS  . Psoriatic arthritis (HCC)   . PULMONARY FIBROSIS ILD POST INFLAMMATORY CHRONIC 07/18/2010   Followed as Primary Care Patient/ [facility]/ [facility]  -s/p ARDS 2006 with bacteremic S  Pna       - CT chest 07/03/10 Nonspecific PF mostly upper lobes       - CT chest 12/03/10 acute gg changes and effusions c/w chf - PFT's  02/08/2018  FVC 0.64 (28 %)   with DLCO  78 % corrects to 147 % for alv volume     . Rheumatic disease   . 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 SOB (shortness of breath) on exertion   . Stroke [facility])     Past Surgical History:  Procedure Laterality Date  . ABDOMINOPLASTY    . CARDIAC CATHETERIZATION N/A 06/01/2015   Procedure: Left Heart Cath and Coronary Angiography;  Surgeon: Runell Gess, MD;  Location: [facility] INVASIVE CV LAB;  Service: Cardiovascular;  Laterality: N/A;  . CARPAL TUNNEL RELEASE    . CHOLECYSTECTOMY     . CORONARY ARTERY BYPASS GRAFT N/A 06/04/2015   Procedure: CORONARY ARTERY BYPASS GRAFT times three            with left internal mammary artery and right leg saphenous vein;  Surgeon: Alleen Borne, MD;  Location: [facility] OR;  Service: Open Heart Surgery;  Laterality: N/A;  . cosmetic breast surgery    . JOINT REPLACEMENT    . KNEE ARTHROSCOPY Left   . TEE WITHOUT CARDIOVERSION  06/04/2015   Procedure: TRANSESOPHAGEAL ECHOCARDIOGRAM (TEE);  Surgeon: Alleen Borne, MD;  Location: [facility] OR;  Service: Open Heart Surgery;;  . TOTAL KNEE ARTHROPLASTY Left   . TOTAL KNEE ARTHROPLASTY Right 06/06/2013   Procedure: RIGHT TOTAL KNEE ARTHROPLASTY;  Surgeon: Loanne Drilling, MD;  Location: [facility] ORS;  Service: Orthopedics;  Laterality: Right;     Home Medications:  Prior to Admission medications   Medication Sig Start Date End Date Taking? Authorizing Provider  acetaminophen (TYLENOL) 650 MG CR tablet Take 650 mg by mouth 2 (two) times daily.   Yes [provider]  albuterol (PROVENTIL) (2.5 MG/3ML) 0.083% nebulizer solution Take 3 mLs (2.5 mg total) by nebulization every 6 (six) hours as needed for wheezing or shortness of breath. 06/19/20  Yes  Aspen, [name], MD  albuterol (VENTOLIN HFA) 108 (90 Base) MCG/ACT inhaler Inhale 2 puffs into the lungs every 4 (four) hours as needed (shortness of breath, if you can't catch your breath). 06/04/20  Yes Nyoka Cowden, MD  amoxicillin (AMOXIL) 500 MG tablet Take 1,000 mg by mouth as directed. Dental procedure   Yes [provider]  apixaban (ELIQUIS) 5 MG TABS tablet Take 1 tablet (5 mg total) by mouth 2 (two) times daily. 12/25/20  Yes Fenton, Clint R, PA  atorvastatin (LIPITOR) 40 MG tablet TAKE 1 TABLET BY MOUTH  DAILY AT 6 PM. Please schedule appointment for future refills. Thank you Patient taking differently: Take 40 mg by mouth daily. 10/04/20  Yes Lars Masson, MD  budesonide (PULMICORT) 0.25 MG/2ML nebulizer solution One twice  daily Patient taking differently: Take 0.25 mg  by nebulization 2 (two) times daily. 07/02/20  Yes Nyoka Cowden, MD  CALCIUM PO Take 1 tablet by mouth daily.   Yes [provider]  Cholecalciferol (VITAMIN D3) 2000 UNITS TABS Take 2,000 Int'l Units by mouth daily.   Yes [provider]  citalopram (CELEXA) 40 MG tablet TAKE 1 TABLET BY MOUTH IN  THE MORNING Patient taking differently: Take 40 mg by mouth daily. 08/20/20  Yes  Aspen, [name], MD  cyclobenzaprine (FLEXERIL) 5 MG tablet TAKE 1 TABLET BY MOUTH AT  BEDTIME AS NEEDED FOR  MUSCLE SPASM(S) Patient taking differently: Take 5 mg by mouth at bedtime as needed for muscle spasms. 05/01/20  Yes  Aspen, [name], MD  diltiazem (CARDIZEM CD) 120 MG 24 hr capsule Take 1 capsule (120 mg total) by mouth at bedtime. 12/25/20  Yes Fenton, Clint R, PA  diltiazem (CARDIZEM CD) 240 MG 24 hr capsule Take 1 capsule (240 mg total) by mouth daily. 12/06/20  Yes Chandrasekhar, Mahesh A, MD  formoterol (PERFOROMIST) 20 MCG/2ML nebulizer solution Take 2 mLs (20 mcg total) by nebulization 2 (two) times daily. Use in nebulizer twice daily perfectly regularly 07/02/20  Yes Nyoka Cowden, MD  furosemide (LASIX) 20 MG tablet TAKE 1 TABLET BY MOUTH  EVERY OTHER DAY Patient taking differently: Take 20 mg by mouth every other day. 05/30/20  Yes Lars Masson, MD  golimumab (SIMPONI ARIA) 50 MG/4ML SOLN injection Inject 50 mg into the vein every 8 (eight) weeks.    Yes [provider]  leflunomide (ARAVA) 20 MG tablet Take 1 tablet (20 mg total) by mouth daily. 03/15/19  Yes  Aspen, [name], MD  levothyroxine (SYNTHROID) 125 MCG tablet TAKE 1 TABLET BY MOUTH EVERY DAY Patient taking differently: Take 125 mcg by mouth daily before breakfast. 06/20/20  Yes  Aspen, [name], MD  losartan (COZAAR) 50 MG tablet TAKE 1 TABLET BY MOUTH  DAILY Patient taking differently: Take 50 mg by mouth daily. 10/10/19  Yes  Lars Masson, MD  montelukast (SINGULAIR) 10 MG tablet TAKE 1 TABLET BY MOUTH AT  BEDTIME Patient taking differently: Take 10 mg by mouth at bedtime. 09/06/20  Yes Nyoka Cowden, MD  OXYGEN Inhale 3 L into the lungs continuous. continuous o2   Yes [provider]  pantoprazole (PROTONIX) 40 MG tablet TAKE 1 TABLET BY MOUTH  DAILY BEFORE BREAKFAST Patient taking differently: Take 40 mg by mouth daily before breakfast. 05/22/20  Yes Nyoka Cowden, MD  Tiotropium Bromide Monohydrate (SPIRIVA RESPIMAT) 2.5 MCG/ACT AERS USE 2 INHALATIONS BY MOUTH  EACH MORNING Patient taking differently: Inhale 2 puffs into the lungs daily. 08/21/20  Yes Nyoka Cowden, MD  traMADol (ULTRAM) 50 MG tablet Take 1 tablet (50 mg total) by mouth 2 (two) times daily. 01/03/20  Yes Worthy Rancher B, FNP  triamcinolone cream (KENALOG) 0.1 % Apply 1 application topically 2 (two) times daily as needed (for psoriasis).    Yes [provider]    Inpatient Medications: Scheduled Meds: . apixaban  5 mg Oral BID  . arformoterol  15 mcg Nebulization BID  . budesonide (PULMICORT) nebulizer solution  0.5 mg Nebulization BID  . diltiazem  240 mg Oral Daily  . furosemide  40 mg Intravenous BID  . ipratropium  0.5 mg Nebulization TID  . levalbuterol  1.25 mg Nebulization TID  . sodium chloride flush  3 mL Intravenous Q12H   Continuous Infusions: . sodium chloride    .  azithromycin Stopped (12/27/20 1328)  . cefTRIAXone (ROCEPHIN)  IV Stopped (12/27/20 1127)   PRN Meds: sodium chloride, acetaminophen, guaiFENesin, ondansetron (ZOFRAN) IV, sodium chloride flush  Allergies:   No Known Allergies  Social History:   Social History   Socioeconomic History  . Marital status: Widowed    Spouse name: Not on file  . Number of children: Not on file  . Years of education: Not on file  . Highest education level: Not on file  Occupational History  . Occupation: retired    Associate Professor: Designer, jewellery  Tobacco  Use  . Smoking status: Former Smoker    Packs/day: 1.50    Years: 58.50    Pack years: 87.75    Types: Cigarettes    Quit date: 03/03/2005    Years since quitting: 15.8  . Smokeless tobacco: Never Used  Vaping Use  . Vaping Use: Never used  Substance and Sexual Activity  . Alcohol use: No  . Drug use: No  . Sexual activity: Not on file  Other Topics Concern  . Not on file  Social History Narrative  . Not on file   Social Determinants of Health   Financial Resource Strain: Low Risk   . Difficulty of Paying Living Expenses: Not hard at all  Food Insecurity: Not on file  Transportation Needs: No Transportation Needs  . Lack of Transportation (Medical): No  . Lack of Transportation (Non-Medical): No  Physical Activity: Not on file  Stress: Not on file  Social Connections: Not on file  Intimate Partner Violence: Not on file    Family History:   Family History  Problem Relation Age of Onset  . Breast cancer Mother   . Coronary artery disease Father   . Rheum arthritis Father      ROS:  Please see the history of present illness.  All other ROS reviewed and negative.     Physical Exam/Data:   Vitals:   12/27/20 0545 12/27/20 0710 12/27/20 0915 12/27/20 1135  BP: (!) 124/53  140/65 (!) 139/100  Pulse: 88 89 90 91  Resp: (!) 21 (!) 24 (!) 26 19  Temp:      TempSrc:      SpO2: 96% 99% 93% 95%   No intake or output data in the 24 hours ending 12/27/20 1333 Last 3 Weights 12/25/2020 11/30/2020 11/27/2020  Weight (lbs) 202 lb 9.6 oz 194 lb 192 lb  Weight (kg) 91.899 kg 87.998 kg 87.091 kg     There is no height or weight on file to calculate BMI.  General: Well developed, well nourished obese WF in no acute distress. Head: Normocephalic, atraumatic, sclera non-icteric, no xanthomas, nares are without discharge. Neck: Negative for carotid bruits. JVP not elevated. Lungs: Coarse BS bilaterally on BiPAP with wheezing present Heart: Distant heart rounds, irregularly  irregular, tachycardic, S1 S2 without murmurs, rubs, or gallops.  Abdomen: Soft, non-tender, non-distended with normoactive bowel sounds. No rebound/guarding. Extremities: No clubbing or cyanosis. No edema. Distal pedal pulses are 2+ and equal bilaterally. Neuro: Alert and oriented X 3. Moves all extremities spontaneously. Psych:  Responds to questions appropriately with a normal affect.  EKG:  The EKG was personally reviewed and demonstrates: appears to be atrial fib 100bpm with variable conduction, no acute STT changes (recent tracing 2/22 atrial flutter with variable AV block)  Telemetry:  Telemetry was personally reviewed and demonstrates:  Atrial fib 1teens also atrial tach in the 120s  Relevant CV Studies:  Event monitor 12/2020  Patient had a minimum heart rate of 65 bpm, maximum heart rate of 214 bpm, and average heart rate of 104 bpm.  Predominant underlying rhythm was sinus rhythm with 1st heart block.  One run of non-sustained ventricular tachycardia occurred lasting 6 beats at longest with a max rate of 214 bpm at fastest.  Isolated PACs were occasional (3.6%), with rare couplets and triplets present.  Isolated PVCs were rare (<1.0%), with rare couplets and trigeminy present.  Atrial Tachycardia vs Atrial Flutter occurred (37% burden), ranging from 65-198 bpm (avg of 133 bpm), the longest lasting 2 days 16 hours with an avg rate of 135 bpm.  Present at activation and de-activation of device.  Triggered and diary events associated with sinus rhythm, atrial tachycardia, atrial flutter, and PVCs.   Likely both atrial tachycardia and atrial flutter is present.  2D Echo 08/2018  - Left ventricle: The cavity size was normal. There was mild  concentric hypertrophy. Systolic function was vigorous. The  estimated ejection fraction was in the range of 65% to 70%. Wall  motion was normal; there were no regional wall motion  abnormalities. Doppler parameters are  consistent with abnormal  left ventricular relaxation (grade 1 diastolic dysfunction).  - Mitral valve: Calcified annulus. Valve area by pressure  half-time: 2.18 cm^2.  - Left atrium: The atrium was mildly dilated.   LHC 05/2015 (prior to CABG)   Prox LAD to Mid LAD lesion, 75% stenosed.  Prox Cx lesion, 95% stenosed.  Prox RCA to Mid RCA lesion, 90% stenosed.  The left ventricular systolic function is normal  IMPRESSION:this Troung has three-vessel disease with preserved LV function. She has a long segmental calcified mid LAD, long RCA and high-grade proximal thrombotic appearing AV groove circumflex stenosis. I believe she would be best served with coronary artery bypass grafting. The cath was performed radially. She cannot weight-based heparin and radial cocktail. S/P loss was less than 5 mL. The sheath was removed and a TR band was placed on the right wrist which is patent hemostasis. The patient left the lab in stable condition. I will restart heparin without bolus. T CTS was notified.  Nanetta Batty. MD, [facility] 06/01/2015 4:47 PM     Laboratory Data:  High Sensitivity Troponin:   Recent Labs  Lab 12/27/20 0136 12/27/20 0338  TROPONINIHS 33* 34*     Chemistry Recent Labs  Lab 12/27/20 0136 12/27/20 0239 12/27/20 0240 12/27/20 0704 12/27/20 1244  NA 139 138 138 137 138  K 4.9 4.9 4.9 5.0 4.6  CL 98 99  --   --   --   CO2 32  --   --   --   --   GLUCOSE 147* 141*  --   --   --   BUN 34* 39*  --   --   --   CREATININE 1.57* 1.60*  --   --   --   CALCIUM 8.9  --   --   --   --   GFRNONAA 35*  --   --   --   --   ANIONGAP 9  --   --   --   --     Recent Labs  Lab 12/27/20 0136  PROT 6.2*  ALBUMIN 3.3*  AST 25  ALT 24  ALKPHOS 55  BILITOT 0.4   Hematology Recent Labs  Lab 12/27/20 0136 12/27/20 0239 12/27/20 0240 12/27/20 0704 12/27/20 1244  WBC 10.6*  --   --   --   --  RBC 3.19*  --   --   --   --   HGB 8.7*   < > 9.9* 9.2* 8.8*  HCT  31.6*   < > 29.0* 27.0* 26.0*  MCV 99.1  --   --   --   --   MCH 27.3  --   --   --   --   MCHC 27.5*  --   --   --   --   RDW 15.8*  --   --   --   --   PLT 213  --   --   --   --    < > = values in this interval not displayed.   BNP Recent Labs  Lab 12/27/20 0136  BNP 329.1*    DDimer No results for input(s): DDIMER in the last 168 hours.   Radiology/Studies:  CT Angio Chest PE W and/or Wo Contrast  Result Date: 12/27/2020 CLINICAL DATA:  75 year old female with increased shortness of breath recently diagnosed with atrial fibrillation. EXAM: CT ANGIOGRAPHY CHEST WITH CONTRAST TECHNIQUE: Multidetector CT imaging of the chest was performed using the standard protocol during bolus administration of intravenous contrast. Multiplanar CT image reconstructions and MIPs were obtained to evaluate the vascular anatomy. CONTRAST:  62mL OMNIPAQUE IOHEXOL 350 MG/ML SOLN COMPARISON:  Chest radiographs 0135 hours today and earlier. CTA 03/10/2019. FINDINGS: Cardiovascular: Adequate contrast bolus timing in the pulmonary arterial tree. Respiratory motion artifact a especially in the upper lobes. Subsequently, upper lobe pulmonary artery branches are not evaluated beyond the hila. There is no central or hilar pulmonary artery filling defect. Middle lobe and lower lobe pulmonary artery branch detail is better preserved, with no filling defect identified in those branches. Prior CABG. Calcified aortic atherosclerosis. Cardiac size has mildly increased since 2020, mild cardiomegaly. No pericardial effusion. Little contrast in the aorta on this exam. Mediastinum/Nodes: Negative.  No lymphadenopathy. Lungs/Pleura: Small layering right pleural effusion. Small left pleural effusion only at the costophrenic angle. Enhancing left lower lobe atelectasis. Confluent but enhancing airspace opacity in the right lower lobe more resembles atelectasis than lower lobe pneumonia. Atelectatic changes to the major airways which  remain patent. There is bilateral upper lobe perihilar and peribronchial ground-glass opacity. Superimposed chronic upper lobe and middle lobe subpleural scarring greater on the right. Upper Abdomen: Absent gallbladder. There is only trace contrast reflux into the hepatic IVC and hepatic veins. Otherwise negative visible liver, spleen, pancreas, adrenal glands, right renal upper pole. Small gastric hiatal hernia redemonstrated. Musculoskeletal: Previous sternotomy. Diffuse cervical spine disc and endplate degeneration with widespread vacuum disc. Mild chronic T4 superior endplate compression fracture is stable. Chronic T7-T8 thoracic spinal stenosis related to bulky disc osteophyte complex. No acute osseous abnormality identified. Review of the MIP images confirms the above findings. IMPRESSION: 1. No pulmonary embolus identified. Upper lobe pulmonary artery branches are obscured by motion. 2. Acute pulmonary edema suspected. Small right greater than left pleural effusions. Confluent but enhancing right lower lobe airspace disease is more compatible with severe atelectasis than Pneumonia. Superimposed chronic lung scarring. 3. Mild cardiomegaly. Prior CABG. Aortic Atherosclerosis (ICD10-I70.0). 4. Chronic T4 compression fracture, T7-T8 thoracic spinal stenosis. Electronically Signed   By: Odessa Fleming M.D.   On: 12/27/2020 04:59   DG Chest Port 1 View  Result Date: 12/27/2020 CLINICAL DATA:  Worsening shortness of breath. EXAM: PORTABLE CHEST 1 VIEW COMPARISON:  November 22, 2020 FINDINGS: Multiple sternal wires and vascular clips are seen. Moderate to marked severity diffusely increased  interstitial lung markings are present. Mild areas of atelectasis and/or infiltrate are also seen within the bilateral lung bases. There is mild, stable elevation of the right hemidiaphragm. Small bilateral pleural effusions are suspected. No pneumothorax is seen. There is moderate to marked severity enlargement of the cardiac  silhouette. The visualized skeletal structures are unremarkable. IMPRESSION: 1. Moderate to marked severity interstitial edema with mild bibasilar atelectasis and/or infiltrate. 2. Small bilateral pleural effusions. Electronically Signed   By: Aram Candela M.D.   On: 12/27/2020 01:55   LONG TERM MONITOR (3-14 DAYS)  Result Date: 12/24/2020  Patient had a minimum heart rate of 65 bpm, maximum heart rate of 214 bpm, and average heart rate of 104 bpm.  Predominant underlying rhythm was sinus rhythm with 1st heart block.  One run of non-sustained ventricular tachycardia occurred lasting 6 beats at longest with a max rate of 214 bpm at fastest.  Isolated PACs were occasional (3.6%), with rare couplets and triplets present.  Isolated PVCs were rare (<1.0%), with rare couplets and trigeminy present.  Atrial Tachycardia vs Atrial Flutter occurred (37% burden), ranging from 65-198 bpm (avg of 133 bpm), the longest lasting 2 days 16 hours with an avg rate of 135 bpm.  Present at activation and de-activation of device.  Triggered and diary events associated with sinus rhythm, atrial tachycardia, atrial flutter, and PVCs.  Likely both atrial tachycardia and atrial flutter is present.     Assessment and Plan:   1. Acute on chronic respiratory failure with hypercapnia and hypoxia - likely multifactorial in context of acute on chronic diastolic CHF/volume excess exacerbated by atrial arrhythmias, superimposed on poor baseline reserve with chronic COPD/ILD, recent Covid-19 infection and possible RLL consolidation  - pulmonary rx per primary team, would have low threshold for pulm consultation/optimization this admission - see below for heart recs  2. Acute on chronic diastolic CHF  - BNP elevated and CXR/CTA concerning for superimposed pulmonary edema - has been missing doses of Lasix at home - nonadherence plus tachycardia is not a good combination for this patient - agree with IV Lasix as ordered,  follow BP, renal function and lytes carefully - await echocardiogram to determine if there has been any interval development of LV dysfunction  3. Paroxysmal atrial fibrillation, atrial flutter, and atrial tachycardia - recent diagnosis, identified shortly after Covid-19 infection - we are seeing with fair frequency as it is not unusual to develop after physiologic stressors. Her underlying obesity and pulmonary disease also increased her baseline risk for this - compliance with diltiazem not totally clear but was due to increase home diltiazem to  QAM/120mg  QPM on 2/22 - suspect tachycardia also exacerbated by HF as well - given paroxysmal nature of arrhythmia on event monitor, DCCV seems to be of less utility as she tends to revert back and forth - antiarrhythmic therapy may be needed if she fails rate control, but options will have to be weighed in light of her underlying CHF, CAD, and CKD - suggest sleep study as OP - HR 1teens with presently - will discuss next steps with MD as she may have to remain NPO for unclear period of time while on BiPAP  4. AKI on CKD stage III - hold losartan to allow BP room for diuresis and HR control and follow  5. Elevated troponin and CAD s/p CABG   - hsTroponin low/flat at 33-34, not suggestive of ACS, suspect demand ischemia - not on ASA due to concomitant Eliquis, not on BB  due to prior bradycardia (not recently an issue but certainly has substantial pulmonary disease making this less ideal) - recommend to resume statin when reliably taking orals   6. Normocytic anemia - will place orders for anemia panel and hemoccult, otherwise further per IM - patient denies any bleeding or melena  7. Question of numbness in fingers and facial droop on first EMS call last night - no acute deficits appreciated on exam presently - relayed to IM to consider further workup   Risk Assessment/Risk Scores:       New York Heart Association (NYHA) Functional  Class NYHA Class IV on arrival, usually II-III  CHA2DS2-VASc Score = 5  This indicates a 7.2% annual risk of stroke. The patient's score is based upon: CHF History: Yes HTN History: Yes Diabetes History: No Stroke History: No Vascular Disease History: Yes Age Score: 1 Gender Score: 1       For questions or updates, please contact [facility] Please consult [email or web address] for contact info under    Signed, Laurann Montana, PA-C  12/27/2020 1:33 PM   Attending Note:   The patient was seen and examined.  Agree with assessment and plan as noted above.  Changes made to the above note as needed.  Patient seen and independently examined with  Ronie Spies, PA .   We discussed all aspects of the encounter. I agree with the assessment and plan as stated above.  1.   Atrial fib:   Has Afib RVR with occasional episodes of Afib with controlled V response She is in moderate - severe respiratory distress and is requiring BIPAP - this is worsening her RVR Cont eliquis for now.  Cont  Dilt 240 CD   2.  Respiratory failure:   Likely a combination of lung disease and CHF, possible pneumonia .  Continue diuresis withIV lasix She has not felt well since covid a month ago    3.  Chronic diastolic CHF:    Echo shows normal LV systolic function.  severe LVH Unable to determine her diastolic function  Further management with primary MD and pulmonary .     I have spent a total of 40 minutes with patient reviewing hospital  notes , telemetry, EKGs, labs and examining patient as well as establishing an assessment and plan that was discussed with the patient. > 50% of time was spent in direct patient care.    Vesta Mixer, Montez Hageman., MD, [facility] 12/27/2020, 4:10 PM [address],  [address] Office [phone] Pager [phone]

## 2020-12-27 NOTE — Progress Notes (Signed)
ABG results listed below taken on bipap with the following settings for 5 hours: IPAP 24 EPAP 8 RR 24 FIO2 30%    Results for [name], Amber "HUGHLENE" (MRN [id number]) as of 12/27/2020 12:50  Ref. Range 12/27/2020 12:44  Sample type Unknown ARTERIAL  pH, Arterial Latest Ref Range: 7.350 - 7.450  7.352  pCO2 arterial Latest Ref Range: 32.0 - 48.0 mmHg 68.1 (HH)  pO2, Arterial Latest Ref Range: 83.0 - 108.0 mmHg 101  TCO2 Latest Ref Range: 22 - 32 mmol/L 40 (H)  Acid-Base Excess Latest Ref Range: 0.0 - 2.0 mmol/L 10.0 (H)  Bicarbonate Latest Ref Range: 20.0 - 28.0 mmol/L 37.7 (H)  O2 Saturation Latest Units: % 97.0  Sodium Latest Ref Range: 135 - 145 mmol/L 138  Potassium Latest Ref Range: 3.5 - 5.1 mmol/L 4.6  Calcium Ionized Latest Ref Range: 1.15 - 1.40 mmol/L 1.03 (L)  Hemoglobin Latest Ref Range: 12.0 - 15.0 g/dL 8.8 (L)  HCT Latest Ref Range: 36.0 - 46.0 % 26.0 (L)

## 2020-12-27 NOTE — ED Triage Notes (Signed)
Pt from home with EMS for increased sob and worsening sciatica. Pt diagnosed with Afib yesterday, started on Eliquis and Cardizem. At baseline on 2L, up to 5L for comfort

## 2020-12-27 NOTE — H&P (Addendum)
History and Physical    [name] FWY:[id number] DOB: [birth year] DOA: 12/27/2020  Referring MD/NP/PA: [place] Shalhoub, MD PCP: [name], Amber Halsted, MD  Patient coming from: home via EMS  Chief Complaint:   Shortness of breath  I have personally briefly reviewed patient's old medical records in [place]   HPI: [name] is a 75 y.o. female with medical history significant of HTN, HLD, CAD s/p NSTEMI with 3v CABG, atrial flutter/atrial fibrillation, ILD, chronic respiratory failure on home O2 of 3L, hypothyroidism, psoriatic arthritis, and chronic kidney disease stage III presents with worsening shortness of breath acutely last night.  Patient had been diagnosed with COVID-19 on 1/20 at her primary care provider's office.  At that time she had fever, fatigue, cough, chest tightness, palpitations, and diarrhea.  Patient had been treated with time dose of IM Rocephin, Z-Pak, and prednisone taper.  EKG at that time had revealed SVT with Ashman beat with anterior infarct pattern.  Symptoms of Covid seem to improve, but still had intermittent cough, fatigue, and diarrhea.  Patient had a telemetry visit with cardiology due to recent episode of SVT on 1/28 and had been ordered a Zio Patch to wear. Patient has been seen in atrial fibrillation clinic yesterday as follow-up from wearing Zio patch which showed atrial flutter and atrial tachycardia with 37% burden with rapid rates.  Patient was started on Eliquis and Cardizem and take them after picking it up from the pharmacy.  Later on while in the kitchen patient reported having worsening right-sided sciatic pain and weakness/numbness in her legs for which she needed assistance to help set her down.  EMS was called at that time and patient was noted to be in atrial fibrillation, but declined transport at that time.  However, patient had recurrence of symptoms with worsening shortness of breath and was noted to have collapsed on  her bed per her daughter.  She had an episode of  incontinence after being helped to sit up.  She was noted to to complain of fatigue, fever/chills, wheezing and sweats for which EMS was again called and patient was okay with transport.    ED Course: Upon upon admission into the emergency department patient was seen to be afebrile, pulse 91-112, respirations 21-26, blood pressures 124/53-158/66, and O2 saturations maintained on 5 L nasal cannula oxygen.  Labs significant for WBC 10.6, hemoglobin 8.7, BUN 34, creatinine 1.57, BNP 329.1, and troponin 33.  Imaging studies significant for edema with small effusions, mild cardiomegaly, confluent right lower lobe disease consistent more with compression atelectasis than pneumonia, and chronic T4 compression fracture.  No signs of pulmonary embolus are appreciated.  Patient has been given Lasix 40 mg IV, albuterol inhaler, and ipratropium inhaler.  ABG revealed pH of 7.219, PCO2 93.8, and PO2 of 117.  TRH called to admit.     Review of Systems  Constitutional: Positive for chills, fever and malaise/fatigue.  HENT: Negative for congestion and nosebleeds.   Eyes: Negative for double vision and photophobia.  Respiratory: Positive for cough, shortness of breath and wheezing.   Cardiovascular: Positive for palpitations, orthopnea and leg swelling. Negative for chest pain.  Gastrointestinal: Positive for diarrhea. Negative for abdominal pain, nausea and vomiting.  Genitourinary: Negative for dysuria and hematuria.  Musculoskeletal: Positive for back pain. Negative for falls.  Skin: Negative for rash.  Neurological: Positive for sensory change. Negative for loss of consciousness.  Psychiatric/Behavioral: Negative for substance abuse. The patient has insomnia.  Past Medical History:  Diagnosis Date  . Acute bronchitis 09/25/2015  . ACUTE ON CHRONIC DIASTOLIC HEART FAILURE 04/04/2296   Qualifier: Diagnosis of  By: Amber Le, MD, [facility], [place]    OA RIGHT KNEE WITH PAIN  . Barrett esophagus   . Bradycardia 06/01/2015  . CHF (congestive heart failure) ([place])   . Chronic respiratory failure ([place])   . Chronic respiratory failure with hypoxia and hypercapnia ([place]) 02/04/2010   Followed in Pulmonary clinic/ [facility]/ [facility]       - 02 dependent  since 07/02/10 >>  83% RA December 05, 2010       - ONO RA 08/05/12  :  Positive sat < 89 x 2:76m repeat on 2lpm rec 08/12/2012  - 06/17/2013 reported desat with activity p Knee surgery > rec restart 2lpm with activity  - 06/27/2013   Walked 2lpm  x one lap @ 185 stopped due to sat 88% not sob , desat to 82% on RA just at th  . COPD III spirometry if use FEV1/VC p saba  07/18/2010   Quit smoking May 2006       - PFT's  04/12/10 FEV1  1.21 (69%) ratio 77 and no change p B2,  DLC0 56%   VC 70%         - PFTs  08/08/2013 FEV1 1.21 (60%) ratio 86 and no change p B2 DLCO 79%  VC 72%  On symbicort 160 2bid  - PFT's  02/08/2018  FEV1 0.70 (40 % ) ratio 56 if use FEV1/VC  p 38 % improvement from saba p symb 160 prior to study with DLCO  78 % corrects to 147  % for alv volume   - 02/08/2018  . Cough variant asthma 02/26/2011   Followed in Pulmonary clinic/ [facility]/ [facility]  - PFT's  06/04/15  FEV1 1.20 (67 % ) ratio 83  p 6 % improvement from saba with DLCO  80 % corrects to 132 % for alv volume      - Clinical dx based on response to symbicort       FENO 09/16/2016  =   96 on symbicort 160 2bid > added singulair  Allergy profile 09/16/2016 >  Eos 0.5 /  IgE  78 neg RAST  -  Referred to rehab 04/29/2017 > completed  . Essential hypertension 04/20/2007   Qualifier: Diagnosis of  By: MPaulina FusiRN, JDaine Gravel  . GERD (gastroesophageal reflux disease)   . History of ARDS 2006  . History of home oxygen therapy    AT NIGHT WHEN SLEEPING 2 L / MIN NASAL CANNULA  . Hyperlipidemia 07/12/2015  . Hypothyroidism   . Morbid (severe) obesity due to excess calories ([place] 04/22/2015   pfts with erv 14% 06/04/15  And 33%  02/08/2018   . NSTEMI (non-ST elevated myocardial infarction) ([place] 05/31/2015  . Pneumococcal pneumonia ([place] 2006   HOSPITALIZED AND DEVELOPED ARDS  . Psoriatic arthritis ([place]   . PULMONARY FIBROSIS ILD POST INFLAMMATORY CHRONIC 07/18/2010   Followed as Primary Care Patient/ [facility]/ [facility]  -s/p ARDS 2006 with bacteremic S  Pna       - CT chest 07/03/10 Nonspecific PF mostly upper lobes       - CT chest 12/03/10 acute gg changes and effusions c/w chf - PFT's  02/08/2018  FVC 0.64 (28 %)   with DLCO  78 % corrects to 147 % for alv volume     .  Rheumatic disease   . 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 SOB (shortness of breath) on exertion   . Stroke [facility])     Past Surgical History:  Procedure Laterality Date  . ABDOMINOPLASTY    . CARDIAC CATHETERIZATION N/A 06/01/2015   Procedure: Left Heart Cath and Coronary Angiography;  Surgeon: Lorretta Harp, MD;  Location: [facility];  Service: Cardiovascular;  Laterality: N/A;  . CARPAL TUNNEL RELEASE    . CHOLECYSTECTOMY    . CORONARY ARTERY BYPASS GRAFT N/A 06/04/2015   Procedure: CORONARY ARTERY BYPASS GRAFT times three            with left internal mammary artery and right leg saphenous vein;  Surgeon: Gaye Pollack, MD;  Location: [facility] OR;  Service: Open Heart Surgery;  Laterality: N/A;  . cosmetic breast surgery    . JOINT REPLACEMENT    . KNEE ARTHROSCOPY Left   . TEE WITHOUT CARDIOVERSION  06/04/2015   Procedure: TRANSESOPHAGEAL ECHOCARDIOGRAM (TEE);  Surgeon: Gaye Pollack, MD;  Location: [facility] OR;  Service: Open Heart Surgery;;  . TOTAL KNEE ARTHROPLASTY Left   . TOTAL KNEE ARTHROPLASTY Right 06/06/2013   Procedure: RIGHT TOTAL KNEE ARTHROPLASTY;  Surgeon: Gearlean Alf, MD;  Location: [facility] ORS;  Service: Orthopedics;  Laterality: Right;     reports that she quit smoking about 15 years ago. Her smoking use included cigarettes. She has a 87.75 pack-year smoking history. She has never used smokeless tobacco. She reports that she does not drink  alcohol and does not use drugs.  No Known Allergies  Family History  Problem Relation Age of Onset  . Breast cancer Mother   . Coronary artery disease Father   . Rheum arthritis Father     Prior to Admission medications   Medication Sig Start Date End Date Taking? Authorizing Provider  acetaminophen (TYLENOL) 650 MG CR tablet Take 650 mg by mouth 2 (two) times daily.    [provider]  albuterol (PROVENTIL) (2.5 MG/3ML) 0.083% nebulizer solution Take 3 mLs (2.5 mg total) by nebulization every 6 (six) hours as needed for wheezing or shortness of breath. 06/19/20   [name], Amber Halsted, MD  albuterol (VENTOLIN HFA) 108 (90 Base) MCG/ACT inhaler Inhale 2 puffs into the lungs every 4 (four) hours as needed (shortness of breath, if you can't catch your breath). 06/04/20   Tanda Rockers, MD  amoxicillin (AMOXIL) 500 MG tablet as needed. Dental procedure    [provider]  apixaban (ELIQUIS) 5 MG TABS tablet Take 1 tablet (5 mg total) by mouth 2 (two) times daily. 12/25/20   Fenton, Clint R, PA  atorvastatin (LIPITOR) 40 MG tablet TAKE 1 TABLET BY MOUTH  DAILY AT 6 PM. Please schedule appointment for future refills. Thank you 10/04/20   Dorothy Spark, MD  budesonide (PULMICORT) 0.25 MG/2ML nebulizer solution One twice daily 07/02/20   Tanda Rockers, MD  CALCIUM PO Take 1 tablet by mouth daily.    [provider]  Cholecalciferol (VITAMIN D3) 2000 UNITS TABS Take 2,000 Int'l Units by mouth daily.    [provider]  citalopram (CELEXA) 40 MG tablet TAKE 1 TABLET BY MOUTH IN  THE MORNING 08/20/20   [name], Amber Halsted, MD  cyclobenzaprine (FLEXERIL) 5 MG tablet TAKE 1 TABLET BY MOUTH AT  BEDTIME AS NEEDED FOR  MUSCLE SPASM(S) Patient taking differently: No sig reported 05/01/20   [name], Amber Halsted, MD  diltiazem (CARDIZEM CD)  120 MG 24 hr capsule Take 1 capsule (120 mg total) by mouth at bedtime. 12/25/20   Fenton, Clint R, PA  diltiazem  (CARDIZEM CD) 240 MG 24 hr capsule Take 1 capsule (240 mg total) by mouth daily. 12/06/20   Chandrasekhar, Lyda Kalata A, MD  formoterol (PERFOROMIST) 20 MCG/2ML nebulizer solution Take 2 mLs (20 mcg total) by nebulization 2 (two) times daily. Use in nebulizer twice daily perfectly regularly 07/02/20   Tanda Rockers, MD  furosemide (LASIX) 20 MG tablet TAKE 1 TABLET BY MOUTH  EVERY OTHER DAY 05/30/20   Dorothy Spark, MD  golimumab (SIMPONI ARIA) 50 MG/4ML SOLN injection Inject 50 mg into the vein every 8 (eight) weeks.     [provider]  leflunomide (ARAVA) 20 MG tablet Take 1 tablet (20 mg total) by mouth daily. 03/15/19   [name], Amber Halsted, MD  levothyroxine (SYNTHROID) 125 MCG tablet TAKE 1 TABLET BY MOUTH EVERY DAY 06/20/20   [name], Amber Halsted, MD  losartan (COZAAR) 50 MG tablet TAKE 1 TABLET BY MOUTH  DAILY 10/10/19   Dorothy Spark, MD  montelukast (SINGULAIR) 10 MG tablet TAKE 1 TABLET BY MOUTH AT  BEDTIME 09/06/20   Tanda Rockers, MD  OXYGEN Inhale 2 L into the lungs continuous. continuous o2    [provider]  pantoprazole (PROTONIX) 40 MG tablet TAKE 1 TABLET BY MOUTH  DAILY BEFORE BREAKFAST 05/22/20   Tanda Rockers, MD  Tiotropium Bromide Monohydrate (SPIRIVA RESPIMAT) 2.5 MCG/ACT AERS USE 2 INHALATIONS BY MOUTH  EACH MORNING 08/21/20   Tanda Rockers, MD  traMADol (ULTRAM) 50 MG tablet Take 1 tablet (50 mg total) by mouth 2 (two) times daily. 01/03/20   Dutch Quint B, FNP  triamcinolone cream (KENALOG) 0.1 % Apply 1 application topically 2 (two) times daily as needed (for psoriasis).     [provider]    Physical Exam:  Constitutional: Elderly obese female who appears to be lethargic, but arousable Vitals:   12/27/20 0025 12/27/20 0145 12/27/20 0342 12/27/20 0545  BP: (!) 149/53 131/84 (!) 158/66 (!) 124/53  Pulse: (!) 50 (!) 46 (!) 25 88  Resp: (!) 22 (!) 24 (!) 26 (!) 21  Temp: 98.8 F (37.1 C)     TempSrc: Oral     SpO2: 96%  92% 93% 96%   Eyes: PERRL, lids and conjunctivae normal ENMT: Mucous membranes are dry. Posterior pharynx clear of any exudate or lesions.  Neck: normal, supple, no masses, no thyromegaly Respiratory: Decreased aeration with crackles appreciated.  Patient currently on BiPAP and just mildly tachypneic at this time. Cardiovascular: Irregular rhythm, no gallop or rubs appreciated.  1+ extremity edema. 2+ pedal pulses. No carotid bruits.  Abdomen: no tenderness, no masses palpated. No hepatosplenomegaly. Bowel sounds positive.  Musculoskeletal: no clubbing / cyanosis. No joint deformity upper and lower extremities.  Skin: no rashes, lesions, ulcers. No induration Neurologic: CN 2-12 grossly intact.  Intermittent jerking movement of patient's Psychiatric: Normal judgment and insight.  Lethargic, but oriented x 3. Normal mood.     Labs on Admission: I have personally reviewed following labs and imaging studies  CBC: Recent Labs  Lab 12/27/20 0136 12/27/20 0239 12/27/20 0240  WBC 10.6*  --   --   NEUTROABS 8.2*  --   --   HGB 8.7* 9.9* 9.9*  HCT 31.6* 29.0* 29.0*  MCV 99.1  --   --   PLT 213  --   --  Basic Metabolic Panel: Recent Labs  Lab 12/27/20 0136 12/27/20 0239 12/27/20 0240  NA 139 138 138  K 4.9 4.9 4.9  CL 98 99  --   CO2 32  --   --   GLUCOSE 147* 141*  --   BUN 34* 39*  --   CREATININE 1.57* 1.60*  --   CALCIUM 8.9  --   --    GFR: Estimated Creatinine Clearance: 31 mL/min (A) (by C-G formula based on SCr of 1.6 mg/dL (H)). Liver Function Tests: Recent Labs  Lab 12/27/20 0136  AST 25  ALT 24  ALKPHOS 55  BILITOT 0.4  PROT 6.2*  ALBUMIN 3.3*   No results for input(s): LIPASE, AMYLASE in the last 168 hours. No results for input(s): AMMONIA in the last 168 hours. Coagulation Profile: No results for input(s): INR, PROTIME in the last 168 hours. Cardiac Enzymes: No results for input(s): CKTOTAL, CKMB, CKMBINDEX, TROPONINI in the last 168 hours. BNP  (last 3 results) Recent Labs    01/18/20 1110  PROBNP 331*   HbA1C: No results for input(s): HGBA1C in the last 72 hours. CBG: No results for input(s): GLUCAP in the last 168 hours. Lipid Profile: No results for input(s): CHOL, HDL, LDLCALC, TRIG, CHOLHDL, LDLDIRECT in the last 72 hours. Thyroid Function Tests: No results for input(s): TSH, T4TOTAL, FREET4, T3FREE, THYROIDAB in the last 72 hours. Anemia Panel: No results for input(s): VITAMINB12, FOLATE, FERRITIN, TIBC, IRON, RETICCTPCT in the last 72 hours. Urine analysis:    Component Value Date/Time   COLORURINE YELLOW 05/31/2013 1412   APPEARANCEUR CLEAR 05/31/2013 1412   LABSPEC 1.027 05/31/2013 1412   PHURINE 5.0 05/31/2013 1412   GLUCOSEU NEGATIVE 05/31/2013 1412   HGBUR NEGATIVE 05/31/2013 1412   HGBUR negative 07/18/2010 0941   BILIRUBINUR SMALL (A) 05/31/2013 1412   BILIRUBINUR n 08/10/2012 1131   KETONESUR NEGATIVE 05/31/2013 1412   PROTEINUR 30 (A) 05/31/2013 1412   UROBILINOGEN 0.2 05/31/2013 1412   NITRITE NEGATIVE 05/31/2013 1412   LEUKOCYTESUR NEGATIVE 05/31/2013 1412   Sepsis Labs: No results found for this or any previous visit (from the past 240 hour(s)).   Radiological Exams on Admission: CT Angio Chest PE W and/or Wo Contrast  Result Date: 12/27/2020 CLINICAL DATA:  75 year old female with increased shortness of breath recently diagnosed with atrial fibrillation. EXAM: CT ANGIOGRAPHY CHEST WITH CONTRAST TECHNIQUE: Multidetector CT imaging of the chest was performed using the standard protocol during bolus administration of intravenous contrast. Multiplanar CT image reconstructions and MIPs were obtained to evaluate the vascular anatomy. CONTRAST:  44m OMNIPAQUE IOHEXOL 350 MG/ML SOLN COMPARISON:  Chest radiographs 0135 hours today and earlier. CTA 03/10/2019. FINDINGS: Cardiovascular: Adequate contrast bolus timing in the pulmonary arterial tree. Respiratory motion artifact a especially in the upper  lobes. Subsequently, upper lobe pulmonary artery branches are not evaluated beyond the hila. There is no central or hilar pulmonary artery filling defect. Middle lobe and lower lobe pulmonary artery branch detail is better preserved, with no filling defect identified in those branches. Prior CABG. Calcified aortic atherosclerosis. Cardiac size has mildly increased since 2020, mild cardiomegaly. No pericardial effusion. Little contrast in the aorta on this exam. Mediastinum/Nodes: Negative.  No lymphadenopathy. Lungs/Pleura: Small layering right pleural effusion. Small left pleural effusion only at the costophrenic angle. Enhancing left lower lobe atelectasis. Confluent but enhancing airspace opacity in the right lower lobe more resembles atelectasis than lower lobe pneumonia. Atelectatic changes to the major airways which remain patent. There is bilateral  upper lobe perihilar and peribronchial ground-glass opacity. Superimposed chronic upper lobe and middle lobe subpleural scarring greater on the right. Upper Abdomen: Absent gallbladder. There is only trace contrast reflux into the hepatic IVC and hepatic veins. Otherwise negative visible liver, spleen, pancreas, adrenal glands, right renal upper pole. Small gastric hiatal hernia redemonstrated. Musculoskeletal: Previous sternotomy. Diffuse cervical spine disc and endplate degeneration with widespread vacuum disc. Mild chronic T4 superior endplate compression fracture is stable. Chronic T7-T8 thoracic spinal stenosis related to bulky disc osteophyte complex. No acute osseous abnormality identified. Review of the MIP images confirms the above findings. IMPRESSION: 1. No pulmonary embolus identified. Upper lobe pulmonary artery branches are obscured by motion. 2. Acute pulmonary edema suspected. Small right greater than left pleural effusions. Confluent but enhancing right lower lobe airspace disease is more compatible with severe atelectasis than Pneumonia.  Superimposed chronic lung scarring. 3. Mild cardiomegaly. Prior CABG. Aortic Atherosclerosis (ICD10-I70.0). 4. Chronic T4 compression fracture, T7-T8 thoracic spinal stenosis. Electronically Signed   By: Genevie Ann M.D.   On: 12/27/2020 04:59   DG Chest Port 1 View  Result Date: 12/27/2020 CLINICAL DATA:  Worsening shortness of breath. EXAM: PORTABLE CHEST 1 VIEW COMPARISON:  November 22, 2020 FINDINGS: Multiple sternal wires and vascular clips are seen. Moderate to marked severity diffusely increased interstitial lung markings are present. Mild areas of atelectasis and/or infiltrate are also seen within the bilateral lung bases. There is mild, stable elevation of the right hemidiaphragm. Small bilateral pleural effusions are suspected. No pneumothorax is seen. There is moderate to marked severity enlargement of the cardiac silhouette. The visualized skeletal structures are unremarkable. IMPRESSION: 1. Moderate to marked severity interstitial edema with mild bibasilar atelectasis and/or infiltrate. 2. Small bilateral pleural effusions. Electronically Signed   By: Virgina Norfolk M.D.   On: 12/27/2020 01:55    EKG: Independently reviewed. Atrial fibrillation in 100 bpm  Assessment/Plan Acute on chronic respiratory failure with hypercapnia and hypoxia secondary to pulmonary edema with diastolic congestive heart failure exacerbation: Patient presented with progressively worsening shortness of breath especially on exertion.  Imaging found patient to have mild cardiomegaly with pulmonary edema and bilateral pleural effusions.  BNP elevated at 329.1.  Last EF was noted to be 65-70% with grade 1 diastolic dysfunction and 4709.  ABG once able to be obtained revealed pH of 7.219, PCO2 93.8, P O2 117 on 5 L of nasal cannula oxygen to suggest respiratory acidosis secondary to hypercapnia.  Suspect pulmonary edema related with reports of atrial flutter/fibrillation with fast rates as seen by the Zio patch as well.   Patient had been initially given 40 mg of Lasix IV. -Admit to a progressive bed -Heart failure order set utilized -Continuous pulse oximetry monitoring -BiPAP and wean when able  -Strict I&Os and daily weight -Continue Lasix 40 mg IV twice daily -Cardiology of patient's admission, we will follow-up for any further recommendation  Atrial fibrillation/atrial flutter: Patient currently in A. fib/atrial flutter with heart rates in the 110s. Had just followed up with cardiology yesterday and had been started on Eliquis and dose of Cardizem CD increased from 125m to 240 mg daily as CHA2DS2-VASc score = 5.   -Continue Eliquis and Cardizem   -Goal potassium 4 and magnesium 2 replace electrolytes as needed  Elevated troponin coronary artery disease: Chronic.  High-sensitivity troponin 33 which appears more so chronic in nature.  Patient with previous year history of three-vessel CABG -Continue to monitor  COPD exacerbationinterstitial lung disease right lobe lobe consolidation: Patient  reportedly had been continued to have a cough after her diagnosis.  Imaging studies significant for concern for right-sided atelectasis more so than pneumonia with chronic scarring of the lungs. -Continue leflunomide -Continue budesonide and Brovana substitution for formoterol nebs twice daily -DuoNebs 4 times daily and as needed -Start empiric antibiotics of Rocephin and azithromycin   Leukocytosis: Acute. WBC mildly elevated at 10.6, but appears to be trending down from 1/20 labs.  Patient reported reports of subjective fever and chills, but is afebrile currently.  Patient met SIRS criteria with tachycardia and tachypnea, but suspect secondary to above.  Patient on empiric antibiotics in case of underlying infection. -Continue to monitor   Suspected acute kidney injury superimposed on chronic kidney disease stage IIIa: On admission creatinine 1.57 with BUN 34.  Patient baseline creatinine appears to be around  1-1.1. Given reports of diarrhea the elevated BUN to creatinine ratio would suspect prerenal cause of symptoms.  A concomitant factor likely to include hypoperfusion with atrial letter/shortness of -Continue to monitor kidney function with diuresis -Avoid nephrotoxic agents  Sciatica: Patient with history of sciatica with complaints of worsening pain on the right side. -PT/OT to eval and  Diarrhea: Patient reports persistent diarrhea initially being diagnosed with COVID and received antibiotics within the last month for COPD exacerbation. -May warrant checking C. difficile panel if diarrhea persist  Essential hypertension: Blood pressures currently stable. -Continue home medications except for losartan  Recent COVID-19 infection: Per review of records of office notes patient tested positive for COVID-19 about 5 weeks ago on 1/20 and results came back on 1/22.  She was vaccinated against COVID-19, but had not received the booster. -Discontinue airborne and contact precautions  Normocytic anemia: Hemoglobin noted to initially be 8.7, but previously had been 12.1 on 11/22/2020.  Patient denied having any blood in stools and repeat hemoglobins have been stable around 9 g/dL. -Continue to monitor blood counts daily  Hypothyroidism: TSH 1.77 on 11/22/2020 -Continue levothyroxine 125 mcg daily  Hyperlipidemia -Continue statin when able  Psoriatic arthritis -Continue leflunomide when able  GERD -Continue Protonix 40 mg daily  Morbid obesity: BMI 40.92 kg/m  DNR: Present on admission.  Patient confirmed that she would like to keep the DO NOT RESUSCITATE order in place.  However, if her respiratory status were to decline at this time she reports that she would want intubation if needed.  DVT prophylaxis: Eliquis   Code Status: DNR Family Communication: Daughter updated at bedside Disposition Plan: Hopefully discharge home once medically stable Consults called: Cardiology Admission  status: Inpatient, require more than 2 midnight stay for chronic respiratory failure with edema fluid overload  Norval Morton MD [facility]   If 7PM-7AM, please contact night-coverage   12/27/2020, 7:07 AM

## 2020-12-27 NOTE — Plan of Care (Signed)

## 2020-12-28 ENCOUNTER — Inpatient Hospital Stay (HOSPITAL_COMMUNITY): Payer: Medicare Other

## 2020-12-28 DIAGNOSIS — D649 Anemia, unspecified: Secondary | ICD-10-CM

## 2020-12-28 DIAGNOSIS — K591 Functional diarrhea: Secondary | ICD-10-CM

## 2020-12-28 DIAGNOSIS — I501 Left ventricular failure: Secondary | ICD-10-CM | POA: Diagnosis not present

## 2020-12-28 DIAGNOSIS — R609 Edema, unspecified: Secondary | ICD-10-CM | POA: Diagnosis not present

## 2020-12-28 DIAGNOSIS — D519 Vitamin B12 deficiency anemia, unspecified: Secondary | ICD-10-CM

## 2020-12-28 DIAGNOSIS — D509 Iron deficiency anemia, unspecified: Secondary | ICD-10-CM

## 2020-12-28 LAB — EXPECTORATED SPUTUM ASSESSMENT W GRAM STAIN, RFLX TO RESP C

## 2020-12-28 LAB — CBC WITH DIFFERENTIAL/PLATELET
Abs Immature Granulocytes: 0.05 10*3/uL (ref 0.00–0.07)
Basophils Absolute: 0.1 10*3/uL (ref 0.0–0.1)
Basophils Relative: 1 %
Eosinophils Absolute: 0.2 10*3/uL (ref 0.0–0.5)
Eosinophils Relative: 2 %
HCT: 26.4 % — ABNORMAL LOW (ref 36.0–46.0)
Hemoglobin: 7.8 g/dL — ABNORMAL LOW (ref 12.0–15.0)
Immature Granulocytes: 1 %
Lymphocytes Relative: 11 %
Lymphs Abs: 0.8 10*3/uL (ref 0.7–4.0)
MCH: 28.2 pg (ref 26.0–34.0)
MCHC: 29.5 g/dL — ABNORMAL LOW (ref 30.0–36.0)
MCV: 95.3 fL (ref 80.0–100.0)
Monocytes Absolute: 0.9 10*3/uL (ref 0.1–1.0)
Monocytes Relative: 12 %
Neutro Abs: 5.5 10*3/uL (ref 1.7–7.7)
Neutrophils Relative %: 73 %
Platelets: 174 10*3/uL (ref 150–400)
RBC: 2.77 MIL/uL — ABNORMAL LOW (ref 3.87–5.11)
RDW: 15.7 % — ABNORMAL HIGH (ref 11.5–15.5)
WBC: 7.5 10*3/uL (ref 4.0–10.5)
nRBC: 0 % (ref 0.0–0.2)

## 2020-12-28 LAB — BASIC METABOLIC PANEL
Anion gap: 9 (ref 5–15)
BUN: 31 mg/dL — ABNORMAL HIGH (ref 8–23)
CO2: 38 mmol/L — ABNORMAL HIGH (ref 22–32)
Calcium: 9.2 mg/dL (ref 8.9–10.3)
Chloride: 94 mmol/L — ABNORMAL LOW (ref 98–111)
Creatinine, Ser: 1.41 mg/dL — ABNORMAL HIGH (ref 0.44–1.00)
GFR, Estimated: 39 mL/min — ABNORMAL LOW (ref >60)
Glucose, Bld: 104 mg/dL — ABNORMAL HIGH (ref 70–99)
Potassium: 4.7 mmol/L (ref 3.5–5.1)
Sodium: 141 mmol/L (ref 135–145)

## 2020-12-28 LAB — IRON AND TIBC
Iron: 25 ug/dL — ABNORMAL LOW (ref 28–170)
Saturation Ratios: 7 % — ABNORMAL LOW (ref 10.4–31.8)
TIBC: 349 ug/dL (ref 250–450)
UIBC: 324 ug/dL

## 2020-12-28 LAB — D-DIMER, QUANTITATIVE: D-Dimer, Quant: 0.79 ug/mL-FEU — ABNORMAL HIGH (ref 0.00–0.50)

## 2020-12-28 LAB — HEMOGLOBIN AND HEMATOCRIT, BLOOD
HCT: 29 % — ABNORMAL LOW (ref 36.0–46.0)
HCT: 33.4 % — ABNORMAL LOW (ref 36.0–46.0)
Hemoglobin: 8.6 g/dL — ABNORMAL LOW (ref 12.0–15.0)
Hemoglobin: 10 g/dL — ABNORMAL LOW (ref 12.0–15.0)

## 2020-12-28 LAB — RETICULOCYTES
Immature Retic Fract: 23.4 % — ABNORMAL HIGH (ref 2.3–15.9)
RBC.: 2.82 MIL/uL — ABNORMAL LOW (ref 3.87–5.11)
Retic Count, Absolute: 51.3 10*3/uL (ref 19.0–186.0)
Retic Ct Pct: 1.8 % (ref 0.4–3.1)

## 2020-12-28 LAB — PROCALCITONIN: Procalcitonin: <0.1 ng/mL

## 2020-12-28 LAB — FOLATE: Folate: 9.9 ng/mL (ref >5.9)

## 2020-12-28 LAB — FERRITIN: Ferritin: 46 ng/mL (ref 11–307)

## 2020-12-28 LAB — VITAMIN B12: Vitamin B-12: 162 pg/mL — ABNORMAL LOW (ref 180–914)

## 2020-12-28 LAB — STREP PNEUMONIAE URINARY ANTIGEN: Strep Pneumo Urinary Antigen: NEGATIVE

## 2020-12-28 LAB — TSH: TSH: 0.752 u[IU]/mL (ref 0.350–4.500)

## 2020-12-28 LAB — PREPARE RBC (CROSSMATCH)

## 2020-12-28 MED ORDER — METHYLPREDNISOLONE SODIUM SUCC 40 MG IJ SOLR
40.0000 mg | Freq: Two times a day (BID) | INTRAMUSCULAR | Status: DC
  Administered 2020-12-28 – 2020-12-31 (×6): 40 mg via INTRAVENOUS
  Filled 2020-12-28 (×6): qty 1

## 2020-12-28 MED ORDER — CYANOCOBALAMIN 1000 MCG/ML IJ SOLN: 1000.0000 ug | INTRAMUSCULAR | Status: DC

## 2020-12-28 MED ORDER — LOPERAMIDE HCL 2 MG PO CAPS: 2.0000 mg | ORAL_CAPSULE | ORAL | Status: DC | PRN

## 2020-12-28 MED ORDER — SODIUM CHLORIDE 0.9% IV SOLUTION: Freq: Once | INTRAVENOUS | Status: AC

## 2020-12-28 MED ORDER — SODIUM CHLORIDE 0.9 % IV SOLN
25.0000 mg | Freq: Once | INTRAVENOUS | Status: AC
  Administered 2020-12-28: 25 mg via INTRAVENOUS
  Filled 2020-12-28: qty 2

## 2020-12-28 MED ORDER — SODIUM CHLORIDE 0.9 % IV SOLN
510.0000 mg | Freq: Once | INTRAVENOUS | Status: AC
  Administered 2020-12-28: 510 mg via INTRAVENOUS
  Filled 2020-12-28: qty 17

## 2020-12-28 MED ORDER — CYANOCOBALAMIN 1000 MCG/ML IJ SOLN
1000.0000 ug | Freq: Every day | INTRAMUSCULAR | Status: DC
  Administered 2020-12-28 – 2020-12-31 (×4): 1000 ug via INTRAMUSCULAR
  Filled 2020-12-28 (×4): qty 1

## 2020-12-28 NOTE — Evaluation (Signed)
Physical Therapy Evaluation Patient Details Name: Iqra Rotundo MRN: [id number] DOB: 12/27/1945 Today's Date: 12/28/2020   History of Present Illness  75 y.o. female with medical history significant of HTN, HLD, CAD s/p NSTEMI with 3v CABG, atrial flutter/atrial fibrillation, ILD, chronic respiratory failure on home O2 of 3L, hypothyroidism, psoriatic arthritis, and chronic kidney disease stage III presenting with worsening shortness of breath. Pt admitted for acute cardiogenic pulmonary edema. Initially required bipap but transitioned to 3L via  on day 2 of admission.    Clinical Impression  Pt admitted with above diagnosis. PTA pt lived alone, mod I mobility with rollator vs no AD. Pt on 2L O2 at baseline. On eval, she required min guard assist bed mobility, min guard assist transfers, and min guard +2 safety/equipment ambulation 10' x 2 with RW. Gait distance limited by fatigue, dizziness, and DOE. Pt noted to be tremulous but reports this is normal for her. She does report feeling anxious. SpO2 90% during mobility on 3L. HR in 70s. BP 120/63 sitting EOB. Pt currently with functional limitations due to the deficits listed below (see PT Problem List). Pt will benefit from skilled PT to increase their independence and safety with mobility to allow discharge to the venue listed below.       Follow Up Recommendations Home health PT;Supervision/Assistance - 24 hour    Equipment Recommendations  None recommended by PT    Recommendations for Other Services       Precautions / Restrictions Precautions Precautions: Fall;Other (comment) Precaution Comments: watch O2, pt tends to hold her breath Restrictions Weight Bearing Restrictions: No      Mobility  Bed Mobility Overal bed mobility: Needs Assistance Bed Mobility: Supine to Sit     Supine to sit: HOB elevated;Min guard     General bed mobility comments: +rail, increased time    Transfers Overall transfer level: Needs  assistance Equipment used: Rolling walker (2 wheeled) Transfers: Sit to/from [employer] Sit to Stand: Min guard Stand pivot transfers: Min guard       General transfer comment: cues for hand placement, increased time, min guard for safety  Ambulation/Gait Ambulation/Gait assistance: Min guard;+2 safety/equipment Gait Distance (Feet): 10 Feet (x 2) Assistive device: Rolling walker (2 wheeled) Gait Pattern/deviations: Step-through pattern;Wide base of support;Decreased stride length Gait velocity: decreased   General Gait Details: Mobilized on 3L SpO2 90%, 2/4 DOE with minimal activity. Pt with c/o dizziness. BP 120/63. Hgb 7.7.  Stairs            Wheelchair Mobility    Modified Rankin (Stroke Patients Only)       Balance Overall balance assessment: Needs assistance Sitting-balance support: No upper extremity supported;Feet supported Sitting balance-[name]: Good     Standing balance support: Bilateral upper extremity supported;During functional activity Standing balance-[name]: Poor Standing balance comment: reliant on BUE support                             Pertinent Vitals/Pain Pain Assessment: Faces Faces Pain Scale: Hurts a little bit Pain Location: back (chronic) Pain Descriptors / Indicators: Discomfort Pain Intervention(s): Monitored during session;Repositioned    Home Living Family/patient expects to be discharged to:: Private residence Living Arrangements: Alone Available Help at Discharge: Family;Available PRN/intermittently Type of Home: House Home Access: Stairs to enter Entrance Stairs-Rails: Right;Left Entrance Stairs-Number of Steps: 4 Home Layout: One level;Laundry or work area in [employer] Equipment: Environmental consultant - 2 wheels;Walker -  4 wheels;Cane - single point;Shower seat;Grab bars - tub/shower;Other (comment);Transport chair (home O2)      Prior Function Level of Independence: Independent with  assistive device(s)         Comments: no AD in house, rollator for longer distances or family uses transport chair in community     Hand Dominance   Dominant Hand: Right    Extremity/Trunk Assessment   Upper Extremity Assessment Upper Extremity Assessment: Defer to OT evaluation    Lower Extremity Assessment Lower Extremity Assessment: Generalized weakness    Cervical / Trunk Assessment Cervical / Trunk Assessment: Normal  Communication   Communication: No difficulties  Cognition Arousal/Alertness: Awake/alert Behavior During Therapy: WFL for tasks assessed/performed Overall Cognitive Status: Within Functional Limits for tasks assessed                                        General Comments General comments (skin integrity, edema, etc.): SpO2 90-93% on 3L. HR in 70s.    Exercises     Assessment/Plan    PT Assessment Patient needs continued PT services  PT Problem List Decreased strength;Decreased mobility;Decreased activity tolerance;Cardiopulmonary status limiting activity;Decreased balance;Pain       PT Treatment Interventions Therapeutic activities;Gait training;Therapeutic exercise;Patient/family education;Balance training;Stair training;Functional mobility training    PT Goals (Current goals can be found in the Care Plan section)  Acute Rehab PT Goals Patient Stated Goal: home PT Goal Formulation: With patient/family Time For Goal Achievement: 01/11/21 Potential to Achieve Goals: Good    Frequency Min 3X/week   Barriers to discharge        Co-evaluation               AM-PAC PT "6 Clicks" Mobility  Outcome Measure Help needed turning from your back to your side while in a flat bed without using bedrails?: None Help needed moving from lying on your back to sitting on the side of a flat bed without using bedrails?: A Little Help needed moving to and from a bed to a chair (including a wheelchair)?: A Little Help needed  standing up from a chair using your arms (e.g., wheelchair or bedside chair)?: A Little Help needed to walk in hospital room?: A Little Help needed climbing 3-5 steps with a railing? : A Lot 6 Click Score: 18    End of Session Equipment Utilized During Treatment: Gait belt;Oxygen Activity Tolerance: Treatment limited secondary to medical complications (Comment) (dizziness, DOE) Patient left: in chair;with call bell/phone within reach;with family/visitor present Nurse Communication: Mobility status PT Visit Diagnosis: Muscle weakness (generalized) (M62.81);Difficulty in walking, not elsewhere classified (R26.2);Pain    Time: 5056-9794 PT Time Calculation (min) (ACUTE ONLY): 30 min   Charges:   PT Evaluation $PT Eval Moderate Complexity: 1 Mod          Aida Raider, PT  Office # [phone] Pager [phone]   Ilda Foil 12/28/2020, 10:00 AM

## 2020-12-28 NOTE — Progress Notes (Signed)
PROGRESS NOTE  [name] YQM:[id number] DOB: 23-Dec-1945   PCP: [name], [name], MD  Patient is from: Home.  Lives with family.  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 fib on Eliquis, CKD-3, psoriatic arthritis, sciatica, recent COVID-19 infection presenting with progressive shortness of breath, near syncope and RLE radiculopathic pain, and admitted for acute on chronic RF and near syncope in the setting of acute on chronic diastolic CHF, possible COPD exacerbation and anemia.  She was a started on IV Lasix, BiPAP, antibiotics and nebulizers. Cardiology consulted on admission and evaluated patient.  PCCM consulted the next day.   Subjective: Seen and examined earlier this morning.  No major events overnight or this morning.  She says she is still short of breath.  She says she feels lightheaded when she went to the bathroom this morning.  She denies chest pain.  Radiculopathic pain improved.  She denies urinary retention or bowel accidents.  She also denies melena or hematochezia.  She is still have diarrhea.  She says diarrhea is chronic for her.  She denies abdominal pain, nausea, vomiting or UTI symptoms.  Patient's daughter at bedside.  Objective: Vitals:   12/28/20 1300 12/28/20 1441 12/28/20 1500 12/28/20 1600  BP: 111/61  132/66   Pulse: 94  94   Resp: (!) 22  20   Temp: 99 F (37.2 C)  98.6 F (37 C)   TempSrc: Axillary  Axillary   SpO2: 98% 97% 97% 98%  Weight:      Height:        Intake/Output Summary (Last 24 hours) at 12/28/2020 1616 Last data filed at 12/28/2020 1445 Gross per 24 hour  Intake 1045 ml  Output 700 ml  Net 345 ml   Filed Weights   12/27/20 1751 12/28/20 0551  Weight: 90.9 kg 91.5 kg    Examination:  GENERAL: No apparent distress.   Nontoxic. HEENT: MMM.  Vision and hearing grossly intact.  NECK: Supple.  No apparent JVD.  RESP:  No IWOB.  Fair aeration but bilateral crackles. CVS: Irregular but normal rate. Heart sounds normal.  ABD/GI/GU: BS+. Abd soft, NTND.  MSK/EXT:  Moves extremities. No apparent deformity. No edema.  SKIN: no apparent skin lesion or wound NEURO: Awake, alert and oriented appropriately.  No apparent focal neuro deficit. PSYCH: Calm. Normal affect.  Procedures:  None  Microbiology summarized: COVID-19 PCR negative. Respiratory culture pending.  Assessment & Plan: Acute on chronic respiratory failure with hypoxia and hypercapnia-multifactorial including CHF exacerbation, COPD exacerbation, with underlying ILD and anemia.  CTA raises some question for pneumonia but.  Procalcitonin is negative.  She is on 3 L at baseline.  Initial ABG with acute on chronic respiratory acidosis favoring obstructive etiology.  ABG improved after BiPAP.  However, continues to endorse significant shortness of breath. -Treat treatable causes as below -Consult PCCM -Continue alternating BiPAP and supplemental oxygen -Could benefit from sleep study  Acute COPD exacerbation/interstitial lung disease-her ABG favors obstructive etiology raising concern for COPD exacerbation.  Not sure if she had a sleep apnea.  CTA negative for PE but raises concern for possible pneumonia.  However, procalcitonin negative. -Continue Pulmicort, Brovana and as needed DuoNeb for now -Continue azithromycin for COPD exacerbation -May consider stopping ceftriaxone if PCCM agrees -I defer steroids to pulmonology -Follow respiratory culture -Check procalcitonin in the  morning -BiPAP and supplemental oxygen as above   Acute on chronic diastolic CHF: she has shortness of breath and orthopnea although the latter seem to be chronic.  Does not look fluid overloaded clinically.  However, she has some crackles on exam.  CXR with cardiomegaly and  pulmonary edema with bilateral pleural effusions.  BNP 329 (higher than baseline).  TTE with LVEF of 60 to 65% and moderate LAE but no other significant finding.  She was started on IV Lasix and had about 700 cc UOP overnight.  Creatinine improving. -Continue IV Lasix 40 mg twice daily -Follow further recommendation by cardiology -Monitor fluid status, renal functions and electrolytes -Sodium and fluid restrictions  Atrial fibrillation/atrial flutter with mild RVR:   TTE as above.  She was recently on Zio patch and noted to have 35% A. fib burden. Seen by cardiology the day prior to admission and started on Eliquis.  Her Cardizem  was increased from 120 to 240 mg as well. -Continue home Eliquis -Continue Cardizem but prefer beta-blockers with good cardioselectivity given her CHF -Cardiology on board. -Monitor and replenish electrolytes as appropriate   Elevated troponin in patient with history of CAD/CABG: Pattern suggestive for demand ischemia.  She has no chest pain.  Her TTE is reassuring. -Cardiology on board.  Symptomatic iron deficiency and vitamin B12 deficiency anemia: Iron sat 7%.  TIBC 349.  Vitamin B12 162.  She denies melena or hematochezia.  Denies NSAID use.  She just started on Eliquis the day prior to admission..  She has dyspnea on lightheadedness Recent Labs    06/06/20 1722 06/08/20 0729 11/22/20 1218 12/27/20 0136 12/27/20 0239 12/27/20 0240 12/27/20 0704 12/27/20 1244 12/28/20 0327 12/28/20 1018  HGB 10.0* 9.0* 12.1 8.7* 9.9* 9.9* 9.2* 8.8* 7.8* 8.6*  -Transfuse 1 unit -IV Feraheme x1.  Consider additional dose after 3 days. -Subcu vitamin B12 1000 mcg daily for 1 week followed by weekly for 1 months and then monthly -Start p.o. ferrous sulfate on discharge.   Leukocytosis/bandemia:  She was recently on steroid.  Could be demargination from that, or due to pneumonia.   -Continue monitoring  CKD-3A/azotemia: Does not meet criteria for AKI.  She is on  losartan and Lasix.  Renal function seems to be improving with diuretics. Recent Labs    01/18/20 1110 06/05/20 1837 06/06/20 1722 06/08/20 0729 11/22/20 1218 12/27/20 0136 12/27/20 0239 12/28/20 0327  BUN 23 28* 23 32* 32* 34* 39* 31*  CREATININE 1.19* 1.16* 1.04* 1.12* 1.37* 1.57* 1.60* 1.41*  -Continue monitoring  Radiculopathic pain in right leg.  No red flags.  She denies urine retention or bowel accidents.  Improved. -Consider gabapentin if pain worse. -Continue home tramadol  Chronic diarrhea: She has diarrhea for quite some time before recent antibiotic.  No fever or abdominal pain.  Abdominal exam benign.  Low suspicion for C. difficile. -Start Imodium  Essential hypertension: Blood pressures currently stable. -Continue home medications except for losartan  Recent COVID-19 infection:  Tested positive on 1/20.  Vaccinated but has not had a booster.  COVID-19 PCR negative this admission. -No indication for isolation  Hypothyroidism: TSH 1.77 on 11/22/2020 -Continue levothyroxine 125 mcg daily  Hyperlipidemia -Continue statin when able  Psoriatic arthritis -Continue leflunomide when able  GERD -Continue Protonix 40 mg daily  Goal of care: DNR, no ACLS meds, no DCCV but intubation and NIPPV  Debility/physical deconditioning -PT/OT eval  Morbid obesity Body mass index is 40.74 kg/m.  -Consider GLP-1 inhibitors if no contraindication  DVT prophylaxis:   apixaban (ELIQUIS) tablet 5 mg  Code Status: Partial code as above Family Communication: Updated patient's daughter at bedside. Level of care: Progressive Status is: Inpatient  Remains inpatient appropriate because:Unsafe d/c plan, IV treatments appropriate due to intensity of illness or inability to take PO and Inpatient level of care appropriate due to severity of illness   Dispo: The patient is from: Home              Anticipated d/c is to: Home              Patient currently is not  medically stable to d/c.   Difficult to place patient No       Consultants:  Cardiology Pulmonology   Sch Meds:  Scheduled Meds: . sodium chloride   Intravenous Once  . apixaban  5 mg Oral BID  . arformoterol  15 mcg Nebulization BID  . atorvastatin  40 mg Oral Daily  . budesonide (PULMICORT) nebulizer solution  0.5 mg Nebulization BID  . citalopram  40 mg Oral Daily  . cyanocobalamin  1,000 mcg Intramuscular Daily   Followed by  . [START ON 01/04/2021] cyanocobalamin  1,000 mcg Intramuscular Weekly   Followed by  . [START ON 02/01/2021] cyanocobalamin  1,000 mcg Intramuscular Q30 days  . diltiazem  240 mg Oral Daily  . furosemide  40 mg Intravenous BID  . ipratropium  0.5 mg Nebulization TID  . leflunomide  20 mg Oral Daily  . levalbuterol  1.25 mg Nebulization TID  . levothyroxine  125 mcg Oral QAC breakfast  . montelukast  10 mg Oral QHS  . pantoprazole  40 mg Oral QAC breakfast  . sodium chloride flush  3 mL Intravenous Q12H   Continuous Infusions: . sodium chloride    . azithromycin 500 mg (12/28/20 0955)  . cefTRIAXone (ROCEPHIN)  IV 1 g (12/28/20 0907)   PRN Meds:.sodium chloride, acetaminophen, cyclobenzaprine, guaiFENesin, ondansetron (ZOFRAN) IV, sodium chloride flush, traMADol, triamcinolone  Antimicrobials: Anti-infectives (From admission, onward)   Start     Dose/Rate Route Frequency Ordered Stop   12/27/20 0915  cefTRIAXone (ROCEPHIN) 1 g in sodium chloride 0.9 % 100 mL IVPB        1 g 200 mL/hr over 30 Minutes Intravenous Every 24 hours 12/27/20 0903     12/27/20 0915  azithromycin (ZITHROMAX) 500 mg in sodium chloride 0.9 % 250 mL IVPB        500 mg 250 mL/hr over 60 Minutes Intravenous Every 24 hours 12/27/20 0903         I have personally reviewed the following labs and images: CBC: Recent Labs  Lab 12/27/20 0136 12/27/20 0239 12/27/20 0240 12/27/20 0704 12/27/20 1244 12/28/20 0327 12/28/20 1018  WBC 10.6*  --   --   --   --  7.5  --    NEUTROABS 8.2*  --   --   --   --  5.5  --   HGB 8.7*   < > 9.9* 9.2* 8.8* 7.8* 8.6*  HCT 31.6*   < > 29.0* 27.0* 26.0* 26.4* 29.0*  MCV 99.1  --   --   --   --  95.3  --   PLT 213  --   --   --   --  174  --    < > = values in this interval not displayed.   BMP &GFR Recent Labs  Lab 12/27/20 0136 12/27/20 0239 12/27/20 0240 12/27/20 4098 12/27/20 1191  12/27/20 1244 12/28/20 0327  NA 139 138 138  --  137 138 141  K 4.9 4.9 4.9  --  5.0 4.6 4.7  CL 98 99  --   --   --   --  94*  CO2 32  --   --   --   --   --  38*  GLUCOSE 147* 141*  --   --   --   --  104*  BUN 34* 39*  --   --   --   --  31*  CREATININE 1.57* 1.60*  --   --   --   --  1.41*  CALCIUM 8.9  --   --   --   --   --  9.2  MG  --   --   --  2.0  --   --   --    Estimated Creatinine Clearance: 35.1 mL/min (A) (by C-G formula based on SCr of 1.41 mg/dL (H)). Liver & Pancreas: Recent Labs  Lab 12/27/20 0136  AST 25  ALT 24  ALKPHOS 55  BILITOT 0.4  PROT 6.2*  ALBUMIN 3.3*   No results for input(s): LIPASE, AMYLASE in the last 168 hours. No results for input(s): AMMONIA in the last 168 hours. Diabetic: No results for input(s): HGBA1C in the last 72 hours. No results for input(s): GLUCAP in the last 168 hours. Cardiac Enzymes: No results for input(s): CKTOTAL, CKMB, CKMBINDEX, TROPONINI in the last 168 hours. Recent Labs    01/18/20 1110  PROBNP 331*   Coagulation Profile: No results for input(s): INR, PROTIME in the last 168 hours. Thyroid Function Tests: Recent Labs    12/28/20 0327  TSH 0.752   Lipid Profile: No results for input(s): CHOL, HDL, LDLCALC, TRIG, CHOLHDL, LDLDIRECT in the last 72 hours. Anemia Panel: Recent Labs    12/28/20 0327  VITAMINB12 162*  FOLATE 9.9  FERRITIN 46  TIBC 349  IRON 25*  RETICCTPCT 1.8   Urine analysis:    Component Value Date/Time   COLORURINE YELLOW 05/31/2013 1412   APPEARANCEUR CLEAR 05/31/2013 1412   LABSPEC 1.027 05/31/2013 1412   PHURINE  5.0 05/31/2013 1412   GLUCOSEU NEGATIVE 05/31/2013 1412   HGBUR NEGATIVE 05/31/2013 1412   HGBUR negative 07/18/2010 0941   BILIRUBINUR SMALL (A) 05/31/2013 1412   BILIRUBINUR n 08/10/2012 1131   KETONESUR NEGATIVE 05/31/2013 1412   PROTEINUR 30 (A) 05/31/2013 1412   UROBILINOGEN 0.2 05/31/2013 1412   NITRITE NEGATIVE 05/31/2013 1412   LEUKOCYTESUR NEGATIVE 05/31/2013 1412   Sepsis Labs: Invalid input(s): PROCALCITONIN, LACTICIDVEN  Microbiology: Recent Results (from the past 240 hour(s))  SARS CORONAVIRUS 2 (TAT 6-24 HRS) Nasopharyngeal Nasopharyngeal Swab     Status: None   Collection Time: 12/27/20  4:10 AM   Specimen: Nasopharyngeal Swab  Result Value Ref Range Status   SARS Coronavirus 2 NEGATIVE NEGATIVE Final    Comment: (NOTE) SARS-CoV-2 target nucleic acids are NOT DETECTED.  The SARS-CoV-2 RNA is generally detectable in upper and lower respiratory specimens during the acute phase of infection. Negative results do not preclude SARS-CoV-2 infection, do not rule out co-infections with other pathogens, and should not be used as the sole basis for treatment or other patient management decisions. Negative results must be combined with clinical observations, patient history, and epidemiological information. The expected result is Negative.  Fact Sheet for Patients: HairSlick.no  Fact Sheet for Healthcare Providers: quierodirigir.com  This test is not yet approved or cleared  by the [place] and  has been authorized for detection and/or diagnosis of SARS-CoV-2 by FDA under an Emergency Use Authorization (EUA). This EUA will remain  in effect (meaning this test can be used) for the duration of the COVID-19 declaration under Se ction 564(b)(1) of the Act, 21 U.S.C. section 360bbb-3(b)(1), unless the authorization is terminated or revoked sooner.  Performed at [facility], [address],  [place], [place] [address]     Radiology Studies: CT HEAD WO CONTRAST  Result Date: 12/27/2020 CLINICAL DATA:  Altered mental status. EXAM: CT HEAD WITHOUT CONTRAST TECHNIQUE: Contiguous axial images were obtained from the base of the skull through the vertex without intravenous contrast. COMPARISON:  November 05, 2018. FINDINGS: Brain: Mild chronic ischemic white matter disease is noted. No mass effect or midline shift is noted. Ventricular size is within normal limits. There is no evidence of mass lesion, hemorrhage or acute infarction. Vascular: No hyperdense vessel or unexpected calcification. Skull: Normal. Negative for fracture or focal lesion. Sinuses/Orbits: No acute finding. Other: Fluid is noted in the right mastoid air cells. IMPRESSION: Mild chronic ischemic white matter disease. No acute intracranial abnormality seen. Electronically Signed   By: Lupita Raider M.D.   On: 12/27/2020 16:58   VAS [place] LOWER EXTREMITY VENOUS (DVT)  Result Date: 12/28/2020  Lower Venous DVT Study Indications: Edema.  Comparison Study: no prior Performing Technologist: Blanch Media RVS  Examination Guidelines: A complete evaluation includes B-mode imaging, spectral Doppler, color Doppler, and power Doppler as needed of all accessible portions of each vessel. Bilateral testing is considered an integral part of a complete examination. Limited examinations for reoccurring indications may be performed as noted. The reflux portion of the exam is performed with the patient in reverse Trendelenburg.  +---------+---------------+---------+-----------+----------+--------------+ RIGHT    CompressibilityPhasicitySpontaneityPropertiesThrombus Aging +---------+---------------+---------+-----------+----------+--------------+ CFV      Full           Yes      Yes                                 +---------+---------------+---------+-----------+----------+--------------+ SFJ      Full                                                         +---------+---------------+---------+-----------+----------+--------------+ FV Prox  Full                                                        +---------+---------------+---------+-----------+----------+--------------+ FV Mid   Full                                                        +---------+---------------+---------+-----------+----------+--------------+ FV Distal               Yes      Yes                                 +---------+---------------+---------+-----------+----------+--------------+  PFV      Full                                                        +---------+---------------+---------+-----------+----------+--------------+ POP      Full           Yes      Yes                                 +---------+---------------+---------+-----------+----------+--------------+ PTV      Full                                                        +---------+---------------+---------+-----------+----------+--------------+ PERO     Full                                                        +---------+---------------+---------+-----------+----------+--------------+   +---------+---------------+---------+-----------+----------+--------------+ LEFT     CompressibilityPhasicitySpontaneityPropertiesThrombus Aging +---------+---------------+---------+-----------+----------+--------------+ CFV      Full           Yes      Yes                                 +---------+---------------+---------+-----------+----------+--------------+ SFJ      Full                                                        +---------+---------------+---------+-----------+----------+--------------+ FV Prox  Full                                                        +---------+---------------+---------+-----------+----------+--------------+ FV Mid   Full                                                         +---------+---------------+---------+-----------+----------+--------------+ FV DistalFull                                                        +---------+---------------+---------+-----------+----------+--------------+ PFV      Full                                                        +---------+---------------+---------+-----------+----------+--------------+  POP      Full           Yes      Yes                                 +---------+---------------+---------+-----------+----------+--------------+ PTV      Full                                                        +---------+---------------+---------+-----------+----------+--------------+ PERO     Full                                                        +---------+---------------+---------+-----------+----------+--------------+     Summary: BILATERAL: - No evidence of deep vein thrombosis seen in the lower extremities, bilaterally. - No evidence of superficial venous thrombosis in the lower extremities, bilaterally. -No evidence of popliteal cyst, bilaterally.   *See table(s) above for measurements and observations.    Preliminary      45 minutes with more than 50% spent in reviewing records, counseling patient/family and coordinating care.   Virgilio Broadhead T. [facility]  If 7PM-7AM, please contact night-coverage [email or web address] 12/28/2020, 4:16 PM

## 2020-12-28 NOTE — Evaluation (Addendum)
Occupational Therapy Evaluation Patient Details Name: [name] MRN: [id number] DOB: 01/10/1946 Today's Date: 12/28/2020    History of Present Illness 75 y.o. female with medical history significant of HTN, HLD, CAD s/p NSTEMI with 3v CABG, atrial flutter/atrial fibrillation, ILD, chronic respiratory failure on home O2 of 3L, hypothyroidism, psoriatic arthritis, and chronic kidney disease stage III presenting with worsening shortness of breath. Pt admitted for acute cardiogenic pulmonary edema. Initially required bipap but transitioned to 3L via [place] on day 2 of admission.   Clinical Impression   PTA, pt lives alone and reports Modified Independent with ADLs and mobility in the home without AD vs with Rollator recently. Pt presents now with deficits in cardiopulmonary tolerance, strength, and dynamic standing balance. Pt overall min guard for short mobility to/from bathroom using RW limited by endurance and intermittent dizziness. Pt requires Setup for UB ADLs and up to Mod A for LB ADLs due to deficits. SpO2 on baseline 3 L O2 90% after activity. Pt would benefit from endurance training during ADLs and energy conservation education during next session. Anticipate pt to progress well enough to return home with [facility] services, but would require 24/7 assist for ADLs/IADLs at this time. If 24/7 not available or slow to progress, recommend consideration of postacute rehab.     Follow Up Recommendations  Home health OT;Supervision/Assistance - 24 hour (consider rehab if 24/7 not available or pt slow to progress)    Equipment Recommendations  None recommended by OT (appears well equipped)    Recommendations for Other Services       Precautions / Restrictions Precautions Precautions: Fall;Other (comment) Precaution Comments: watch O2, pt tends to hold her breath Restrictions Weight Bearing Restrictions: No      Mobility Bed Mobility Overal bed mobility: Needs Assistance Bed Mobility:  Supine to Sit     Supine to sit: HOB elevated;Min guard     General bed mobility comments: +rail, increased time    Transfers Overall transfer level: Needs assistance Equipment used: Rolling walker (2 wheeled) Transfers: Sit to/from [employer] Sit to Stand: Min guard Stand pivot transfers: Min guard       General transfer comment: cues for hand placement, increased time, min guard for safety    Balance Overall balance assessment: Needs assistance Sitting-balance support: No upper extremity supported;Feet supported Sitting balance-[name]: Good     Standing balance support: Bilateral upper extremity supported;During functional activity Standing balance-[name]: Poor Standing balance comment: reliant on BUE support                           ADL either performed or assessed with clinical judgement   ADL Overall ADL's : Needs assistance/impaired Eating/Feeding: Set up;Sitting   Grooming: Min guard;Standing;Wash/dry hands Grooming Details (indicate cue type and reason): min guard standing at sink to ensure safety, pt reporting dizziness and required seated rest break Upper Body Bathing: Set up;Sitting   Lower Body Bathing: Minimal assistance;Sit to/from stand   Upper Body Dressing : Set up;Sitting   Lower Body Dressing: Moderate assistance;Sit to/from stand Lower Body Dressing Details (indicate cue type and reason): Unable to don socks sitting EOB Toilet Transfer: Min guard;Ambulation;RW;Regular Toilet;Grab bars Toilet Transfer Details (indicate cue type and reason): min guard for safety due to intermittent dizzines in standing Toileting- Clothing Manipulation and Hygiene: Min guard;Sit to/from stand Toileting - Clothing Manipulation Details (indicate cue type and reason): min guard for clothing mgmt in standing, able to perform  peri care without assist     Functional mobility during ADLs: Min guard;Rolling walker General ADL  Comments: Limited by deficits in cardiopulmonary tolerance, intermittent dizziness with transitional movements and reported LE weakness     Vision Baseline Vision/History: Wears glasses Patient Visual Report: No change from baseline Vision Assessment?: No apparent visual deficits     Perception     Praxis      Pertinent Vitals/Pain Pain Assessment: Faces Faces Pain Scale: Hurts a little bit Pain Location: back (chronic) Pain Descriptors / Indicators: Discomfort Pain Intervention(s): Monitored during session     Hand Dominance Right   Extremity/Trunk Assessment Upper Extremity Assessment Upper Extremity Assessment: Generalized weakness   Lower Extremity Assessment Lower Extremity Assessment: Defer to PT evaluation   Cervical / Trunk Assessment Cervical / Trunk Assessment: Normal   Communication Communication Communication: No difficulties   Cognition Arousal/Alertness: Awake/alert Behavior During Therapy: WFL for tasks assessed/performed Overall Cognitive Status: Within Functional Limits for tasks assessed                                 General Comments: Pleasant, quick-witted   General Comments  Pt received on 2 L O2 (reports wearing 3 L O2 at baseline more recently so bumped up to 3 L). SpO2 90% after bed mobility where pt noted to hold breath, increased to 93% with cues for pursed lip breathing, SpO2 90% after activity on 3 L O2. BP stable though pt reports intermittent dizziness. Daughter present at start and end of session    Exercises     Shoulder Instructions      Home Living Family/patient expects to be discharged to:: Private residence Living Arrangements: Alone Available Help at Discharge: Family;Available PRN/intermittently Type of Home: House Home Access: Stairs to enter [employer] of Steps: 4 Entrance Stairs-Rails: Right;Left Home Layout: One level;Laundry or work area in basement     [employer] Shower/Tub: Multimedia programmer: Standard     Home Equipment: Environmental consultant - 2 wheels;Walker - 4 wheels;Cane - single point;Shower seat;Grab bars - tub/shower;Other (comment);Transport chair (home O2)          Prior Functioning/Environment Level of Independence: Needs assistance  Gait / Transfers Assistance Needed: No AD in the house, Rollator for longer distances or transport chair pushed by family in community ADL's / Homemaking Assistance Needed: Typically able to complete ADLs, most IADLs (daughter assists as needed). Pt does not drive anymore   Comments: Use of 3 L O2 at home per pt        OT Problem List: Decreased strength;Decreased activity tolerance;Impaired balance (sitting and/or standing);Cardiopulmonary status limiting activity      OT Treatment/Interventions: Self-care/ADL training;Therapeutic exercise;Energy conservation;DME and/or AE instruction;Therapeutic activities;Patient/family education;Balance training    OT Goals(Current goals can be found in the care plan section) Acute Rehab OT Goals Patient Stated Goal: be able to go home when ready OT Goal Formulation: With patient/family Time For Goal Achievement: 01/11/21 Potential to Achieve Goals: Good ADL Goals Pt Will Perform Grooming: with modified independence;standing Pt Will Perform Lower Body Bathing: with modified independence;sit to/from stand Pt Will Perform Lower Body Dressing: with modified independence;sit to/from stand Pt Will Transfer to Toilet: with modified independence;ambulating Pt Will Perform Toileting - Clothing Manipulation and hygiene: with modified independence;sit to/from stand Additional ADL Goal #1: Pt to demonstrate verbalization of at least 3 energy conservation strategies to implement during ADLs/IADLs  OT Frequency: Min 2X/week  Barriers to D/C:            Co-evaluation PT/OT/SLP Co-Evaluation/Treatment: Yes Reason for Co-Treatment: Other (comment);For patient/therapist safety (dizziness  in standing)   OT goals addressed during session: ADL's and self-care      AM-PAC OT "6 Clicks" Daily Activity     Outcome Measure Help from another person eating meals?: A Little Help from another person taking care of personal grooming?: A Little Help from another person toileting, which includes using toliet, bedpan, or urinal?: A Little Help from another person bathing (including washing, rinsing, drying)?: A Little Help from another person to put on and taking off regular upper body clothing?: A Little Help from another person to put on and taking off regular lower body clothing?: A Lot 6 Click Score: 17   End of Session Equipment Utilized During Treatment: Gait belt;Rolling walker;Oxygen Nurse Communication: Mobility status  Activity Tolerance: Patient tolerated treatment well Patient left: in chair;with call bell/phone within reach  OT Visit Diagnosis: Unsteadiness on feet (R26.81);Other abnormalities of gait and mobility (R26.89);Muscle weakness (generalized) (M62.81)                Time: 9379-0240 OT Time Calculation (min): 37 min Charges:  OT General Charges $OT Visit: 1 Visit OT Evaluation $OT Eval Moderate Complexity: 1 Mod  Bradd Canary, OTR/L Acute Rehab Services Office: [phone]  Lorre Munroe 12/28/2020, 11:52 AM

## 2020-12-28 NOTE — Consult Note (Signed)
NAME:  [name], MRN:  [id number], DOB:  [birth year], LOS: 1 ADMISSION DATE:  12/27/2020, CONSULTATION DATE:  2/25 REFERRING MD:  Dr. Alanda Slim, CHIEF COMPLAINT:  Dyspnea    Brief History:  75 year old female on 2L [place] at baseline with H/O COPD, ILD, COVID on 1/20 with new A.Flutter/Atrial Tachycardia now with pulmonary edema and respiratory distress.   History of Present Illness:  75 year old female presents to ED on 2/24 with worsening shortness of breath overnight. Recently Diagnosed with COVID 1/20. Given IM Rocephin, Z-Pack, and Prednisone Taper. Symptoms of fever, chest pain began to improve however patient continued to have cough, fatigue, and diarrhea. Recently with Cardiology vist due to episode of SVT on 1/28, given Zio Patch. 2/23 started on Eliquis and Cardizem for a.flutter and atrial tachycardia. Later that evening patient with worsening shortness of breath and syncope. Per Daughter patient collapsed on her bed and had an episode of incontinence.  On arrival to ED Oxygen Saturations 96% on 5L [place], WBC 10.6. Hemoglobin 8.7. BUN 34. Crt 1.57. BNP 329.1. Trop 33. CXR with edema and small effusions, mild cardiomegaly, and right lower lobe disease concerning for atelectasis. ABG 7.219/93.8/117. Given 40 mg lasix and started on BiPAP. Cardiology Consulted. Started on Rocephin/Azithromycin.   Patient now on 3L [place] per bedside RN patient with severe dyspnea with exertion. Currently receiving one unit RBC with hemoglobin 8.6. Patient denies blood in stool or sputum. Patient reports cough with sputum production, unsure color.   Past Medical History:  HTN, HLD, CAD s/p NSTEMI with 3V CABG, A.Fib/Flutter, ILD > Post ARDS 2006 with Trach and Prolonged Vent needs, Chronic Respiratory failure on 3L baseline, CKD stage 3  Significant Hospital Events:  2/24 > Present to ED. Respiratory Distress requiring BiPAP  Consults:  Cardiology  Pulmonary   Procedures:    Significant Diagnostic  Tests:  CTA Chest 2/24 > 1. No pulmonary embolus identified. Upper lobe pulmonary artery branches are obscured by motion. 2. Acute pulmonary edema suspected. Small right greater than left pleural effusions. Confluent but enhancing right lower lobe airspace disease is more compatible with severe atelectasis than Pneumonia. Superimposed chronic lung scarring. 3. Mild cardiomegaly. Prior CABG. Aortic Atherosclerosis (ICD10-I70.0). 4. Chronic T4 compression fracture, T7-T8 thoracic spinal stenosis. CT Head 2/24 > Mild chronic ischemic white matter disease. No acute intracranial abnormality seen.  Micro Data:  Sputum 2/25 >>  Antimicrobials:  Azithromycin/Rocephin 2/25>>   Interim History / Subjective:  As above.   Objective   Blood pressure (!) 147/55, pulse 99, temperature 98.6 F (37 C), temperature source Axillary, resp. rate (!) 21, height 4\' 11"  (1.499 m), weight 91.5 kg, SpO2 98 %.    FiO2 (%):  [30 %] 30 %   Intake/Output Summary (Last 24 hours) at 12/28/2020 1153 Last data filed at 12/28/2020 0300 Gross per 24 hour  Intake 350 ml  Output 700 ml  Net -350 ml   Filed Weights   12/27/20 1751 12/28/20 0551  Weight: 90.9 kg 91.5 kg    Examination: General: Elderly female lying in bed HENT: Dry MM  Lungs: Coarse breaths sounds, no use of accessory muscles  Cardiovascular: Irregular, Tachy, no MRG Abdomen: obese, soft, active bowel sounds  Extremities: -edema  Neuro: alert, oriented, follows commands  GU: deferred   Resolved Hospital Problem list     Assessment & Plan:   Acute on Chronic Hypoxic/Hypercarbic Respiratory Failure -On 2L  at baseline   Recent COVID Diagnosis > now with A.Flutter/Atrial  Tachycardia -CTA Negative PE, +pulmonary edema, small right greater then left pleural effusions H/O ILD s/p prolonged vent/trach needs with ARDS H/O COPD Plan -Continue Scheduled and PRN Nebs > Patient with no wheezing, does not appear to be in acute exacerbation   -Question pulmonary edema is due to A.Flutter/Atrial Tachycardia secondary to COVID. ECHO with EF 60-65, severe LVH, IVC dilated with <50% resp variability. Continue Diuresis as renal function allows -Send PCT, Strep. P, and Sputum Cultures. Continue Rocephin and Azthromycin for now -Encouraged patient to use IS/Flutter valve as able   Goals of Care:  Code Status: patient expresses wishes for no CPR/ACLS however would allow intubation > went over her chronic lung disease and fear we may not be able to wean her from vent support > she wishes to discuss further with daughter.    Labs   CBC: Recent Labs  Lab 12/27/20 0136 12/27/20 0239 12/27/20 0240 12/27/20 0704 12/27/20 1244 12/28/20 0327 12/28/20 1018  WBC 10.6*  --   --   --   --  7.5  --   NEUTROABS 8.2*  --   --   --   --  5.5  --   HGB 8.7*   < > 9.9* 9.2* 8.8* 7.8* 8.6*  HCT 31.6*   < > 29.0* 27.0* 26.0* 26.4* 29.0*  MCV 99.1  --   --   --   --  95.3  --   PLT 213  --   --   --   --  174  --    < > = values in this interval not displayed.    Basic Metabolic Panel: Recent Labs  Lab 12/27/20 0136 12/27/20 0239 12/27/20 0240 12/27/20 0338 12/27/20 0704 12/27/20 1244 12/28/20 0327  NA 139 138 138  --  137 138 141  K 4.9 4.9 4.9  --  5.0 4.6 4.7  CL 98 99  --   --   --   --  94*  CO2 32  --   --   --   --   --  38*  GLUCOSE 147* 141*  --   --   --   --  104*  BUN 34* 39*  --   --   --   --  31*  CREATININE 1.57* 1.60*  --   --   --   --  1.41*  CALCIUM 8.9  --   --   --   --   --  9.2  MG  --   --   --  2.0  --   --   --    GFR: Estimated Creatinine Clearance: 35.1 mL/min (A) (by C-G formula based on SCr of 1.41 mg/dL (H)). Recent Labs  Lab 12/27/20 0136 12/28/20 0327  WBC 10.6* 7.5    Liver Function Tests: Recent Labs  Lab 12/27/20 0136  AST 25  ALT 24  ALKPHOS 55  BILITOT 0.4  PROT 6.2*  ALBUMIN 3.3*   No results for input(s): LIPASE, AMYLASE in the last 168 hours. No results for input(s):  AMMONIA in the last 168 hours.  ABG    Component Value Date/Time   PHART 7.352 12/27/2020 1244   PCO2ART 68.1 (HH) 12/27/2020 1244   PO2ART 101 12/27/2020 1244   HCO3 37.7 (H) 12/27/2020 1244   TCO2 40 (H) 12/27/2020 1244   ACIDBASEDEF 2.0 06/05/2015 0119   O2SAT 97.0 12/27/2020 1244     Coagulation Profile: No results for input(s): INR, PROTIME in  the last 168 hours.  Cardiac Enzymes: No results for input(s): CKTOTAL, CKMB, CKMBINDEX, TROPONINI in the last 168 hours.  HbA1C: Hgb A1c MFr Bld  Date/Time Value Ref Range Status  08/25/2018 08:19 PM 5.3 4.8 - 5.6 % Final    Comment:    (NOTE) Pre diabetes:          5.7%-6.4% Diabetes:              >6.4% Glycemic control for   <7.0% adults with diabetes   05/31/2015 09:29 PM 5.8 (H) 4.8 - 5.6 % Final    Comment:    (NOTE)         Pre-diabetes: 5.7 - 6.4         Diabetes: >6.4         Glycemic control for adults with diabetes: <7.0     CBG: No results for input(s): GLUCAP in the last 168 hours.  Review of Systems:   +Cough. +Dyspnea.   Past Medical History:  She,  has a past medical history of Arthritis, Barrett esophagus, Bradycardia (06/01/2015), CAD in native artery, Chronic diastolic (congestive) heart failure (HCC), Chronic kidney disease, stage 3a (HCC), Chronic respiratory failure (HCC), Chronic respiratory failure with hypoxia and hypercapnia (HCC) (02/04/2010), COPD III spirometry if use FEV1/VC p saba  (07/18/2010), Cough variant asthma (02/26/2011), Essential hypertension (04/20/2007), GERD (gastroesophageal reflux disease), History of ARDS (2006), History of home oxygen therapy, Hyperlipidemia (07/12/2015), Hypothyroidism, Intracranial hemorrhage (HCC) (2019), Mild carotid artery disease (HCC), Morbid (severe) obesity due to excess calories (HCC) (04/22/2015), NSTEMI (non-ST elevated myocardial infarction) (HCC) (05/31/2015), Pneumococcal pneumonia (HCC) (2006), Psoriatic arthritis (HCC), PULMONARY FIBROSIS ILD POST  INFLAMMATORY CHRONIC (07/18/2010), Rheumatic disease, S/P CABG x 3 (06/04/2015), and SOB (shortness of breath) on exertion.   Surgical History:   Past Surgical History:  Procedure Laterality Date  . ABDOMINOPLASTY    . CARDIAC CATHETERIZATION N/A 06/01/2015   Procedure: Left Heart Cath and Coronary Angiography;  Surgeon: Runell Gess, MD;  [facility] INVASIVE CV LAB;  Service: Cardiovascular;  Laterality: N/A;  . CARPAL TUNNEL RELEASE    . CHOLECYSTECTOMY    . CORONARY ARTERY BYPASS GRAFT N/A 06/04/2015   Procedure: CORONARY ARTERY BYPASS GRAFT times three            with left internal mammary artery and right leg saphenous vein;  Surgeon: Alleen Borne, MD;  Location: [facility] OR;  Service: Open Heart Surgery;  Laterality: N/A;  . cosmetic breast surgery    . JOINT REPLACEMENT    . KNEE ARTHROSCOPY Left   . TEE WITHOUT CARDIOVERSION  06/04/2015   Procedure: TRANSESOPHAGEAL ECHOCARDIOGRAM (TEE);  Surgeon: Alleen Borne, MD;  Location: [facility] OR;  Service: Open Heart Surgery;;  . TOTAL KNEE ARTHROPLASTY Left   . TOTAL KNEE ARTHROPLASTY Right 06/06/2013   Procedure: RIGHT TOTAL KNEE ARTHROPLASTY;  Surgeon: Loanne Drilling, MD;  Location: [facility] ORS;  Service: Orthopedics;  Laterality: Right;     Social History:   reports that she quit smoking about 15 years ago. Her smoking use included cigarettes. She has a 87.75 pack-year smoking history. She has never used smokeless tobacco. She reports that she does not drink alcohol and does not use drugs.   Family History:  Her family history includes Breast cancer in her mother; Coronary artery disease in her father; Rheum arthritis in her father.   Allergies No Known Allergies   Home Medications  Prior to Admission medications   Medication Sig Start Date End Date  Taking? Authorizing Provider  acetaminophen (TYLENOL) 650 MG CR tablet Take 650 mg by mouth 2 (two) times daily.   Yes [provider]  albuterol (PROVENTIL) (2.5 MG/3ML) 0.083% nebulizer  solution Take 3 mLs (2.5 mg total) by nebulization every 6 (six) hours as needed for wheezing or shortness of breath. 06/19/20  Yes [name], [name], MD  albuterol (VENTOLIN HFA) 108 (90 Base) MCG/ACT inhaler Inhale 2 puffs into the lungs every 4 (four) hours as needed (shortness of breath, if you can't catch your breath). 06/04/20  Yes Nyoka Cowden, MD  amoxicillin (AMOXIL) 500 MG tablet Take 1,000 mg by mouth as directed. Dental procedure   Yes [provider]  apixaban (ELIQUIS) 5 MG TABS tablet Take 1 tablet (5 mg total) by mouth 2 (two) times daily. 12/25/20  Yes Fenton, Clint R, PA  atorvastatin (LIPITOR) 40 MG tablet TAKE 1 TABLET BY MOUTH  DAILY AT 6 PM. Please schedule appointment for future refills. Thank you Patient taking differently: Take 40 mg by mouth daily. 10/04/20  Yes Lars Masson, MD  budesonide (PULMICORT) 0.25 MG/2ML nebulizer solution One twice daily Patient taking differently: Take 0.25 mg by nebulization 2 (two) times daily. 07/02/20  Yes Nyoka Cowden, MD  CALCIUM PO Take 1 tablet by mouth daily.   Yes [provider]  Cholecalciferol (VITAMIN D3) 2000 UNITS TABS Take 2,000 Int'l Units by mouth daily.   Yes [provider]  citalopram (CELEXA) 40 MG tablet TAKE 1 TABLET BY MOUTH IN  THE MORNING Patient taking differently: Take 40 mg by mouth daily. 08/20/20  Yes [name], [name], MD  cyclobenzaprine (FLEXERIL) 5 MG tablet TAKE 1 TABLET BY MOUTH AT  BEDTIME AS NEEDED FOR  MUSCLE SPASM(S) Patient taking differently: Take 5 mg by mouth at bedtime as needed for muscle spasms. 05/01/20  Yes [name], [name], MD  diltiazem (CARDIZEM CD) 120 MG 24 hr capsule Take 1 capsule (120 mg total) by mouth at bedtime. 12/25/20  Yes Fenton, Clint R, PA  diltiazem (CARDIZEM CD) 240 MG 24 hr capsule Take 1 capsule (240 mg total) by mouth daily. 12/06/20  Yes Chandrasekhar, Mahesh A, MD  formoterol (PERFOROMIST) 20 MCG/2ML nebulizer  solution Take 2 mLs (20 mcg total) by nebulization 2 (two) times daily. Use in nebulizer twice daily perfectly regularly 07/02/20  Yes Nyoka Cowden, MD  furosemide (LASIX) 20 MG tablet TAKE 1 TABLET BY MOUTH  EVERY OTHER DAY Patient taking differently: Take 20 mg by mouth every other day. 05/30/20  Yes Lars Masson, MD  golimumab (SIMPONI ARIA) 50 MG/4ML SOLN injection Inject 50 mg into the vein every 8 (eight) weeks.    Yes [provider]  leflunomide (ARAVA) 20 MG tablet Take 1 tablet (20 mg total) by mouth daily. 03/15/19  Yes [name], [name], MD  levothyroxine (SYNTHROID) 125 MCG tablet TAKE 1 TABLET BY MOUTH EVERY DAY Patient taking differently: Take 125 mcg by mouth daily before breakfast. 06/20/20  Yes [name], [name], MD  losartan (COZAAR) 50 MG tablet TAKE 1 TABLET BY MOUTH  DAILY Patient taking differently: Take 50 mg by mouth daily. 10/10/19  Yes Lars Masson, MD  montelukast (SINGULAIR) 10 MG tablet TAKE 1 TABLET BY MOUTH AT  BEDTIME Patient taking differently: Take 10 mg by mouth at bedtime. 09/06/20  Yes Nyoka Cowden, MD  OXYGEN Inhale 3 L into the lungs continuous. continuous o2   Yes [provider]  pantoprazole (PROTONIX) 40 MG tablet TAKE 1 TABLET BY MOUTH  DAILY BEFORE BREAKFAST Patient taking differently: Take 40 mg by mouth daily before breakfast. 05/22/20  Yes Nyoka Cowden, MD  Tiotropium Bromide Monohydrate (SPIRIVA RESPIMAT) 2.5 MCG/ACT AERS USE 2 INHALATIONS BY MOUTH  EACH MORNING Patient taking differently: Inhale 2 puffs into the lungs daily. 08/21/20  Yes Nyoka Cowden, MD  traMADol (ULTRAM) 50 MG tablet Take 1 tablet (50 mg total) by mouth 2 (two) times daily. 01/03/20  Yes Worthy Rancher B, FNP  triamcinolone cream (KENALOG) 0.1 % Apply 1 application topically 2 (two) times daily as needed (for psoriasis).    Yes [provider]    Jovita Kussmaul, AGACNP-BC [facility]  PCCM Pgr:  [phone]

## 2020-12-28 NOTE — Progress Notes (Signed)
Lower extremity venous has been completed.   Preliminary results in CV Proc.   Blanch Media 12/28/2020 2:27 PM

## 2020-12-29 ENCOUNTER — Inpatient Hospital Stay (HOSPITAL_COMMUNITY): Payer: Medicare Other

## 2020-12-29 DIAGNOSIS — I4891 Unspecified atrial fibrillation: Secondary | ICD-10-CM

## 2020-12-29 LAB — CBC
HCT: 31.8 % — ABNORMAL LOW (ref 36.0–46.0)
Hemoglobin: 9.8 g/dL — ABNORMAL LOW (ref 12.0–15.0)
MCH: 28.8 pg (ref 26.0–34.0)
MCHC: 30.8 g/dL (ref 30.0–36.0)
MCV: 93.5 fL (ref 80.0–100.0)
Platelets: 169 10*3/uL (ref 150–400)
RBC: 3.4 MIL/uL — ABNORMAL LOW (ref 3.87–5.11)
RDW: 15.5 % (ref 11.5–15.5)
WBC: 6.8 10*3/uL (ref 4.0–10.5)
nRBC: 0 % (ref 0.0–0.2)

## 2020-12-29 LAB — BPAM RBC
Blood Product Expiration Date: 202203282359
ISSUE DATE / TIME: 202202251241
Unit Type and Rh: 5100

## 2020-12-29 LAB — MAGNESIUM: Magnesium: 1.8 mg/dL (ref 1.7–2.4)

## 2020-12-29 LAB — RENAL FUNCTION PANEL
Albumin: 3.1 g/dL — ABNORMAL LOW (ref 3.5–5.0)
Anion gap: 13 (ref 5–15)
BUN: 31 mg/dL — ABNORMAL HIGH (ref 8–23)
CO2: 34 mmol/L — ABNORMAL HIGH (ref 22–32)
Calcium: 9.4 mg/dL (ref 8.9–10.3)
Chloride: 94 mmol/L — ABNORMAL LOW (ref 98–111)
Creatinine, Ser: 1.46 mg/dL — ABNORMAL HIGH (ref 0.44–1.00)
GFR, Estimated: 38 mL/min — ABNORMAL LOW (ref >60)
Glucose, Bld: 194 mg/dL — ABNORMAL HIGH (ref 70–99)
Phosphorus: 2.8 mg/dL (ref 2.5–4.6)
Potassium: 3.9 mmol/L (ref 3.5–5.1)
Sodium: 141 mmol/L (ref 135–145)

## 2020-12-29 LAB — TYPE AND SCREEN
ABO/RH(D): O POS
Antibody Screen: NEGATIVE
Unit division: 0

## 2020-12-29 LAB — PROCALCITONIN: Procalcitonin: <0.1 ng/mL

## 2020-12-29 MED ORDER — MAGNESIUM SULFATE 2 GM/50ML IV SOLN
2.0000 g | Freq: Once | INTRAVENOUS | Status: AC
  Administered 2020-12-29: 2 g via INTRAVENOUS
  Filled 2020-12-29: qty 50

## 2020-12-29 MED ORDER — POTASSIUM CHLORIDE CRYS ER 20 MEQ PO TBCR
20.0000 meq | EXTENDED_RELEASE_TABLET | Freq: Once | ORAL | Status: AC
  Administered 2020-12-29: 20 meq via ORAL
  Filled 2020-12-29: qty 1

## 2020-12-29 MED ORDER — DILTIAZEM HCL 60 MG PO TABS
120.0000 mg | ORAL_TABLET | Freq: Four times a day (QID) | ORAL | Status: DC
  Administered 2020-12-29 – 2020-12-30 (×5): 120 mg via ORAL
  Filled 2020-12-29 (×5): qty 2

## 2020-12-29 NOTE — Progress Notes (Signed)
Physical Therapy Treatment Patient Details Name: [name] MRN: [id number] DOB: 09/14/1946 Today's Date: 12/29/2020    History of Present Illness 75 y.o. female with medical history significant of HTN, HLD, CAD s/p NSTEMI with 3v CABG, atrial flutter/atrial fibrillation, ILD, chronic respiratory failure on home O2 of 3L, hypothyroidism, psoriatic arthritis, and chronic kidney disease stage III presenting with worsening shortness of breath. Pt admitted for acute cardiogenic pulmonary edema. Initially required bipap but transitioned to 3L via [place] on day 2 of admission, Bipap for sleeping.    PT Comments    Pt denied dizziness with ambulation today but continued to have UE/ LE tremors after exertion. Pt was unable to tolerate increased ambulation distance in one bout but was able to tolerate same distance as last session 4x instead of 2 as well as using BSC. Pt fatigues quickly with all activity and ambulation distance is limited by spinal stenosis. SPO2 remained in 90's on 3L O2 and HR in 80's. PT will continue to follow.    Follow Up Recommendations  Home health PT;Supervision/Assistance - 24 hour     Equipment Recommendations  None recommended by PT    Recommendations for Other Services       Precautions / Restrictions Precautions Precautions: Fall Precaution Comments: watch O2, pt tends to hold her breath Restrictions Weight Bearing Restrictions: No    Mobility  Bed Mobility Overal bed mobility: Needs Assistance Bed Mobility: Supine to Sit;Sit to Supine     Supine to sit: HOB elevated;Supervision Sit to supine: Supervision   General bed mobility comments: +rail, increased time    Transfers Overall transfer level: Needs assistance Equipment used: Rolling walker (2 wheeled) Transfers: Sit to/from Stand Sit to Stand: Min guard         General transfer comment: performed 5x from bed and BSC, vc's for hand placement, then pt remembered on her  own  Ambulation/Gait Ambulation/Gait assistance: Min guard Gait Distance (Feet): 10 Feet (4x) Assistive device: Rolling walker (2 wheeled) Gait Pattern/deviations: Step-through pattern;Wide base of support;Decreased stride length;Trunk flexed Gait velocity: decreased Gait velocity interpretation: <1.31 ft/sec, indicative of household ambulator General Gait Details: pt denied dizziness but did have UE/ LE tremors after each bout of ambulation. Seated rest in between bouts. Remained on 3L O2 with sats in 90's   Stairs             Wheelchair Mobility    Modified Rankin (Stroke Patients Only)       Balance Overall balance assessment: Needs assistance Sitting-balance support: No upper extremity supported;Feet supported Sitting balance-[name]: Good     Standing balance support: Bilateral upper extremity supported;During functional activity Standing balance-[name]: Fair Standing balance comment: able to stand without holding RW to pull up mesh underpants                            Cognition Arousal/Alertness: Awake/alert Behavior During Therapy: WFL for tasks assessed/performed Overall Cognitive Status: Within Functional Limits for tasks assessed                                 General Comments: Pleasant, quick-witted but anxious (baseline)      Exercises      General Comments General comments (skin integrity, edema, etc.): BP 156/70. Pt reports her nose is burning, asked RN about adding a bubbler to pt's O2.      Pertinent  Vitals/Pain Pain Assessment: Faces Faces Pain Scale: Hurts little more Pain Location: HA, back (chronic) Pain Descriptors / Indicators: Discomfort;Headache Pain Intervention(s): Monitored during session    Home Living                      Prior Function            PT Goals (current goals can now be found in the care plan section) Acute Rehab PT Goals Patient Stated Goal: be able to go home  when ready PT Goal Formulation: With patient/family Time For Goal Achievement: 01/11/21 Potential to Achieve Goals: Good Progress towards PT goals: Progressing toward goals    Frequency    Min 3X/week      PT Plan Current plan remains appropriate    Co-evaluation              AM-PAC PT "6 Clicks" Mobility   Outcome Measure  Help needed turning from your back to your side while in a flat bed without using bedrails?: None Help needed moving from lying on your back to sitting on the side of a flat bed without using bedrails?: A Little Help needed moving to and from a bed to a chair (including a wheelchair)?: A Little Help needed standing up from a chair using your arms (e.g., wheelchair or bedside chair)?: A Little Help needed to walk in hospital room?: A Little Help needed climbing 3-5 steps with a railing? : A Lot 6 Click Score: 18    End of Session Equipment Utilized During Treatment: Gait belt;Oxygen Activity Tolerance: Patient limited by fatigue Patient left: with call bell/phone within reach;with family/visitor present;in bed Nurse Communication: Mobility status (pt's nose burning) PT Visit Diagnosis: Muscle weakness (generalized) (M62.81);Difficulty in walking, not elsewhere classified (R26.2);Pain     Time: 1213-1250 PT Time Calculation (min) (ACUTE ONLY): 37 min  Charges:  $Gait Training: 8-22 mins $Therapeutic Activity: 8-22 mins                     Lyanne Co, PT  Acute Rehab Services  Pager [phone] Office [phone]    [name] 12/29/2020, 1:32 PM

## 2020-12-29 NOTE — Progress Notes (Signed)
Cardiology Progress Note  Patient ID: [name] MRN: [id number] DOB: [birth year] Date of Encounter: 12/29/2020  Primary Cardiologist: Tobias Alexander, MD  Subjective   Chief Complaint: Shortness of breath  HPI: A. fib with RVR noted.  Rates not controlled.  Still not moving much air.  Does not appear grossly volume overloaded.  ROS:  All other ROS reviewed and negative. Pertinent positives noted in the HPI.     Inpatient Medications  Scheduled Meds: . apixaban  5 mg Oral BID  . arformoterol  15 mcg Nebulization BID  . atorvastatin  40 mg Oral Daily  . budesonide (PULMICORT) nebulizer solution  0.5 mg Nebulization BID  . citalopram  40 mg Oral Daily  . cyanocobalamin  1,000 mcg Intramuscular Daily   Followed by  . [START ON 01/04/2021] cyanocobalamin  1,000 mcg Intramuscular Weekly   Followed by  . [START ON 02/01/2021] cyanocobalamin  1,000 mcg Intramuscular Q30 days  . diltiazem  120 mg Oral QID  . furosemide  40 mg Intravenous BID  . ipratropium  0.5 mg Nebulization TID  . leflunomide  20 mg Oral Daily  . levalbuterol  1.25 mg Nebulization TID  . levothyroxine  125 mcg Oral QAC breakfast  . methylPREDNISolone (SOLU-MEDROL) injection  40 mg Intravenous Q12H  . montelukast  10 mg Oral QHS  . pantoprazole  40 mg Oral QAC breakfast  . sodium chloride flush  3 mL Intravenous Q12H   Continuous Infusions: . sodium chloride    . azithromycin Stopped (12/28/20 1105)   PRN Meds: sodium chloride, acetaminophen, cyclobenzaprine, guaiFENesin, loperamide, ondansetron (ZOFRAN) IV, sodium chloride flush, traMADol, triamcinolone   Vital Signs   Vitals:   12/28/20 2358 12/29/20 0015 12/29/20 0020 12/29/20 0404  BP: 126/73   (!) 160/89  Pulse: (!) 105 95  84  Resp: 20 (!) 34 20 20  Temp: 98.9 F (37.2 C)   97.9 F (36.6 C)  TempSrc: Oral   Axillary  SpO2: 99%   97%  Weight:      Height:        Intake/Output Summary (Last 24 hours) at 12/29/2020 0805 Last data filed  at 12/29/2020 0601 Gross per 24 hour  Intake 1400.33 ml  Output 950 ml  Net 450.33 ml   Last 3 Weights 12/28/2020 12/27/2020 12/25/2020  Weight (lbs) 201 lb 11.5 oz 200 lb 6.4 oz 202 lb 9.6 oz  Weight (kg) 91.5 kg 90.9 kg 91.899 kg      Telemetry  Overnight telemetry shows atrial fibrillation up into the 160s, which I personally reviewed.   ECG  The most recent ECG shows atrial fibrillation heart rate 100, no acute ischemic changes or evidence of infarction, which I personally reviewed.   Physical Exam   Vitals:   12/28/20 2358 12/29/20 0015 12/29/20 0020 12/29/20 0404  BP: 126/73   (!) 160/89  Pulse: (!) 105 95  84  Resp: 20 (!) 34 20 20  Temp: 98.9 F (37.2 C)   97.9 F (36.6 C)  TempSrc: Oral   Axillary  SpO2: 99%   97%  Weight:      Height:         Intake/Output Summary (Last 24 hours) at 12/29/2020 0805 Last data filed at 12/29/2020 0601 Gross per 24 hour  Intake 1400.33 ml  Output 950 ml  Net 450.33 ml    Last 3 Weights 12/28/2020 12/27/2020 12/25/2020  Weight (lbs) 201 lb 11.5 oz 200 lb 6.4 oz 202 lb 9.6 oz  Weight (  kg) 91.5 kg 90.9 kg 91.899 kg    Body mass index is 40.74 kg/m.  General: Well nourished, well developed, in no acute distress Head: Atraumatic, normal size  Eyes: PEERLA, EOMI  Neck: Supple, no JVD Endocrine: No thryomegaly Cardiac: Normal S1, S2; irregular rhythm, no murmurs rubs or gallops Lungs: Very diminished breath sounds, poor airway movement Abd: Soft, nontender, no hepatomegaly  Ext: No edema, pulses 2+ Musculoskeletal: No deformities, BUE and BLE strength normal and equal Skin: Warm and dry, no rashes   Neuro: Alert and oriented to person, place, time, and situation, CNII-XII grossly intact, no focal deficits  Psych: Normal mood and affect   Labs  High Sensitivity Troponin:   Recent Labs  Lab 12/27/20 0136 12/27/20 0338  TROPONINIHS 33* 34*     Cardiac EnzymesNo results for input(s): TROPONINI in the last 168 hours. No results  for input(s): TROPIPOC in the last 168 hours.  Chemistry Recent Labs  Lab 12/27/20 0136 12/27/20 0239 12/27/20 0240 12/27/20 1244 12/28/20 0327 12/29/20 0215  NA 139 138   < > 138 141 141  K 4.9 4.9   < > 4.6 4.7 3.9  CL 98 99  --   --  94* 94*  CO2 32  --   --   --  38* 34*  GLUCOSE 147* 141*  --   --  104* 194*  BUN 34* 39*  --   --  31* 31*  CREATININE 1.57* 1.60*  --   --  1.41* 1.46*  CALCIUM 8.9  --   --   --  9.2 9.4  PROT 6.2*  --   --   --   --   --   ALBUMIN 3.3*  --   --   --   --  3.1*  AST 25  --   --   --   --   --   ALT 24  --   --   --   --   --   ALKPHOS 55  --   --   --   --   --   BILITOT 0.4  --   --   --   --   --   GFRNONAA 35*  --   --   --  39* 38*  ANIONGAP 9  --   --   --  9 13   < > = values in this interval not displayed.    Hematology Recent Labs  Lab 12/27/20 0136 12/27/20 0239 12/28/20 0327 12/28/20 1018 12/28/20 1753 12/29/20 0215  WBC 10.6*  --  7.5  --   --  6.8  RBC 3.19*  --  2.77*  2.82*  --   --  3.40*  HGB 8.7*   < > 7.8* 8.6* 10.0* 9.8*  HCT 31.6*   < > 26.4* 29.0* 33.4* 31.8*  MCV 99.1  --  95.3  --   --  93.5  MCH 27.3  --  28.2  --   --  28.8  MCHC 27.5*  --  29.5*  --   --  30.8  RDW 15.8*  --  15.7*  --   --  15.5  PLT 213  --  174  --   --  169   < > = values in this interval not displayed.   BNP Recent Labs  Lab 12/27/20 0136  BNP 329.1*    DDimer  Recent Labs  Lab 12/28/20 1238  DDIMER 0.79*  Radiology  CT HEAD WO CONTRAST  Result Date: 12/27/2020 CLINICAL DATA:  Altered mental status. EXAM: CT HEAD WITHOUT CONTRAST TECHNIQUE: Contiguous axial images were obtained from the base of the skull through the vertex without intravenous contrast. COMPARISON:  November 05, 2018. FINDINGS: Brain: Mild chronic ischemic white matter disease is noted. No mass effect or midline shift is noted. Ventricular size is within normal limits. There is no evidence of mass lesion, hemorrhage or acute infarction. Vascular: No  hyperdense vessel or unexpected calcification. Skull: Normal. Negative for fracture or focal lesion. Sinuses/Orbits: No acute finding. Other: Fluid is noted in the right mastoid air cells. IMPRESSION: Mild chronic ischemic white matter disease. No acute intracranial abnormality seen. Electronically Signed   By: Lupita Raider M.D.   On: 12/27/2020 16:58   ECHOCARDIOGRAM COMPLETE  Result Date: 12/27/2020    ECHOCARDIOGRAM REPORT   Patient Name:   [name] Date of Exam: 12/27/2020 Medical Rec #:  [id number]      Height:       59.0 in Accession #:    [id number]     Weight:       202.6 lb Date of Birth:  Mar 26, 1946       BSA:          1.855 m Patient Age:    74 years       BP:           101/46 mmHg Patient Gender: F              HR:           106 bpm. Exam Location:  Inpatient Procedure: 2D Echo, Cardiac Doppler, Color Doppler and Intracardiac            Opacification Agent Indications:    Atrial fibillation                 CHF-Acute Diastolic  History:        Patient has prior history of Echocardiogram examinations, most                 recent 08/26/2018. CHF, CAD and Previous Myocardial Infarction,                 Prior CABG, COPD; Risk Factors:Hypertension and Dyslipidemia.  Sonographer:    Ross Ludwig RDCS (AE) Referring Phys: [id number] RONDELL A SMITH IMPRESSIONS  1. Left ventricular ejection fraction, by estimation, is 60 to 65%. The left ventricle has normal function. The left ventricle has no regional wall motion abnormalities. There is severe left ventricular hypertrophy. Left ventricular diastolic parameters  are indeterminate.  2. Right ventricular systolic function is normal. The right ventricular size is normal.  3. Left atrial size was moderately dilated.  4. The mitral valve is normal in structure. No evidence of mitral valve regurgitation. No evidence of mitral stenosis. Moderate mitral annular calcification.  5. The aortic valve is normal in structure. Aortic valve regurgitation is not visualized.  No aortic stenosis is present.  6. The inferior vena cava is dilated in size with <50% respiratory variability, suggesting right atrial pressure of 15 mmHg. FINDINGS  Left Ventricle: Left ventricular ejection fraction, by estimation, is 60 to 65%. The left ventricle has normal function. The left ventricle has no regional wall motion abnormalities. Definity contrast agent was given IV to delineate the left ventricular  endocardial borders. The left ventricular internal cavity size was normal in size. There is severe left ventricular hypertrophy. Left ventricular diastolic parameters are  indeterminate. Right Ventricle: The right ventricular size is normal. No increase in right ventricular wall thickness. Right ventricular systolic function is normal. Left Atrium: Left atrial size was moderately dilated. Right Atrium: Right atrial size was normal in size. Pericardium: There is no evidence of pericardial effusion. Mitral Valve: The mitral valve is normal in structure. There is mild thickening of the mitral valve leaflet(s). There is moderate calcification of the mitral valve leaflet(s). Moderate mitral annular calcification. No evidence of mitral valve regurgitation. No evidence of mitral valve stenosis. Tricuspid Valve: The tricuspid valve is normal in structure. Tricuspid valve regurgitation is not demonstrated. No evidence of tricuspid stenosis. Aortic Valve: The aortic valve is normal in structure. Aortic valve regurgitation is not visualized. No aortic stenosis is present. Aortic valve mean gradient measures 4.0 mmHg. Aortic valve peak gradient measures 5.9 mmHg. Aortic valve area, by VTI measures 3.66 cm. Pulmonic Valve: The pulmonic valve was normal in structure. Pulmonic valve regurgitation is not visualized. No evidence of pulmonic stenosis. Aorta: The aortic root is normal in size and structure. Venous: The inferior vena cava is dilated in size with less than 50% respiratory variability, suggesting right  atrial pressure of 15 mmHg. IAS/Shunts: No atrial level shunt detected by color flow Doppler.  LEFT VENTRICLE PLAX 2D LVIDd:         3.40 cm LVIDs:         2.80 cm LV PW:         1.90 cm LV IVS:        1.60 cm LVOT diam:     2.70 cm LV SV:         75 LV SV Index:   40 LVOT Area:     5.73 cm  RIGHT VENTRICLE          IVC RV Basal diam:  2.50 cm  IVC diam: 2.30 cm LEFT ATRIUM           Index       RIGHT ATRIUM           Index LA diam:      4.60 cm 2.48 cm/m  RA Area:     11.70 cm LA Vol (A2C): 27.9 ml 15.04 ml/m RA Volume:   23.20 ml  12.51 ml/m LA Vol (A4C): 92.1 ml 49.66 ml/m  AORTIC VALVE AV Area (Vmax):    3.54 cm AV Area (Vmean):   2.88 cm AV Area (VTI):     3.66 cm AV Vmax:           121.00 cm/s AV Vmean:          94.800 cm/s AV VTI:            0.205 m AV Peak Grad:      5.9 mmHg AV Mean Grad:      4.0 mmHg LVOT Vmax:         74.80 cm/s LVOT Vmean:        47.700 cm/s LVOT VTI:          0.131 m LVOT/AV VTI ratio: 0.64  AORTA Ao Root diam: 3.20 cm Ao Asc diam:  3.50 cm  SHUNTS Systemic VTI:  0.13 m Systemic Diam: 2.70 cm Donato Schultz MD Electronically signed by Donato Schultz MD Signature Date/Time: 12/27/2020/3:24:57 PM    Final    VAS [place] LOWER EXTREMITY VENOUS (DVT)  Result Date: 12/28/2020  Lower Venous DVT Study Indications: Edema.  Comparison Study: no prior Performing Technologist: Blanch Media RVS  Examination Guidelines:  A complete evaluation includes B-mode imaging, spectral Doppler, color Doppler, and power Doppler as needed of all accessible portions of each vessel. Bilateral testing is considered an integral part of a complete examination. Limited examinations for reoccurring indications may be performed as noted. The reflux portion of the exam is performed with the patient in reverse Trendelenburg.  +---------+---------------+---------+-----------+----------+--------------+ RIGHT    CompressibilityPhasicitySpontaneityPropertiesThrombus Aging  +---------+---------------+---------+-----------+----------+--------------+ CFV      Full           Yes      Yes                                 +---------+---------------+---------+-----------+----------+--------------+ SFJ      Full                                                        +---------+---------------+---------+-----------+----------+--------------+ FV Prox  Full                                                        +---------+---------------+---------+-----------+----------+--------------+ FV Mid   Full                                                        +---------+---------------+---------+-----------+----------+--------------+ FV Distal               Yes      Yes                                 +---------+---------------+---------+-----------+----------+--------------+ PFV      Full                                                        +---------+---------------+---------+-----------+----------+--------------+ POP      Full           Yes      Yes                                 +---------+---------------+---------+-----------+----------+--------------+ PTV      Full                                                        +---------+---------------+---------+-----------+----------+--------------+ PERO     Full                                                        +---------+---------------+---------+-----------+----------+--------------+   +---------+---------------+---------+-----------+----------+--------------+  LEFT     CompressibilityPhasicitySpontaneityPropertiesThrombus Aging +---------+---------------+---------+-----------+----------+--------------+ CFV      Full           Yes      Yes                                 +---------+---------------+---------+-----------+----------+--------------+ SFJ      Full                                                         +---------+---------------+---------+-----------+----------+--------------+ FV Prox  Full                                                        +---------+---------------+---------+-----------+----------+--------------+ FV Mid   Full                                                        +---------+---------------+---------+-----------+----------+--------------+ FV DistalFull                                                        +---------+---------------+---------+-----------+----------+--------------+ PFV      Full                                                        +---------+---------------+---------+-----------+----------+--------------+ POP      Full           Yes      Yes                                 +---------+---------------+---------+-----------+----------+--------------+ PTV      Full                                                        +---------+---------------+---------+-----------+----------+--------------+ PERO     Full                                                        +---------+---------------+---------+-----------+----------+--------------+     Summary: BILATERAL: - No evidence of deep vein thrombosis seen in the lower extremities, bilaterally. - No evidence of superficial venous thrombosis in the lower extremities, bilaterally. -No evidence of popliteal cyst, bilaterally.   *See table(s) above for measurements and observations.  Preliminary     Cardiac Studies  TTE 12/27/2020 1. Left ventricular ejection fraction, by estimation, is 60 to 65%. The  left ventricle has normal function. The left ventricle has no regional  wall motion abnormalities. There is severe left ventricular hypertrophy.  Left ventricular diastolic parameters  are indeterminate.  2. Right ventricular systolic function is normal. The right ventricular  size is normal.  3. Left atrial size was moderately dilated.  4. The mitral valve is normal in  structure. No evidence of mitral valve  regurgitation. No evidence of mitral stenosis. Moderate mitral annular  calcification.  5. The aortic valve is normal in structure. Aortic valve regurgitation is  not visualized. No aortic stenosis is present.  6. The inferior vena cava is dilated in size with <50% respiratory  variability, suggesting right atrial pressure of 15 mmHg.   Patient Profile  [name] is a 75 y.o. female with non-STEMI status post CABG, interstitial lung disease/COPD/chronic respiratory failure on 3 L home O2, asthma, recent COVID-19 infection, diastolic heart failure, CKD stage III, prior intracerebral hemorrhage, anemia who was admitted on 12/27/2020 with acute hypoxic/hypercapnic respiratory failure, atrial fibrillation and diastolic heart failure.  Assessment & Plan   1.  Atrial fibrillation with RVR/acute diastolic heart failure/acute COPD exacerbation -Admitted with profound hypercapnia and hypoxia.  BNP was slightly elevated.  She is now in A. fib with RVR.  She is not moving much air.  To me this is COPD likely driving her A. fib which did put her in congestive heart failure.  Would recommend aggressive COPD treatment.  Pulmonary is following. -For A. fib we will switch her to 120 mg of diltiazem every 4 hours.  May need to add digoxin.  I think a beta-blocker may not be best given her lung disease.  We will see how she does. -On Eliquis 5 mg twice daily.  Admitted with anemia and is status post transfusion.  No signs of bleeding.  This appears to be related to iron deficiency and B12 deficiency.  She has no signs of bleeding.  I am okay to continue Eliquis for now.  We may need to reconsider if anemia becomes a recurrent issue. -For A. fib with paroxysmal.  More persistent.  Likely driven by lung disease.  I really would only recommend rate control until her lungs improved. -Regarding volume status she does not appear that volume overloaded.  I suspect her A.  fib with RVR is just creating some congestion.  We will attempt better rate control to see how she does.  Again, her lungs are more consistent with profound COPD. -I have ordered a repeat chest x-ray to determine if she still has vascular congestion.  Really difficult to determine that as her airway movement is very poor.  For questions or updates, please contact [facility] Please consult [email or web address] for contact info under   Time Spent with Patient: I have spent a total of 25 minutes with patient reviewing hospital notes, telemetry, EKGs, labs and examining the patient as well as establishing an assessment and plan that was discussed with the patient.  > 50% of time was spent in direct patient care.    Signed, Lenna Gilford. Flora Lipps, MD [facility]  [facility]  12/29/2020 8:05 AM

## 2020-12-29 NOTE — Progress Notes (Signed)
PROGRESS NOTE  [name] ZOX:[id number] DOB: 11-20-1945   PCP: [name], [name], MD  Patient is from: Home.  Lives with family.  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 fib on Eliquis, CKD-3, psoriatic arthritis, sciatica, recent COVID-19 infection presenting with progressive shortness of breath, near syncope and RLE radiculopathic pain, and admitted for acute on chronic RF and near syncope in the setting of acute on chronic diastolic CHF, possible COPD exacerbation and anemia.  She was a started on IV Lasix, BiPAP, antibiotics and nebulizers. Cardiology consulted on admission and evaluated patient.  PCCM consulted and added steroid.  Subjective: Seen and examined earlier this morning.  No major events overnight of this morning.  No complaints.  Breathing and weakness better.  She did not feel lightheaded or dizzy when she got up this morning.  She denies chest pain.  Diarrhea improved as well.  Objective: Vitals:   12/29/20 0020 12/29/20 0404 12/29/20 0837 12/29/20 1048  BP:  (!) 160/89  126/64  Pulse:  84 88   Resp: Temp:  97.9 F (36.6 C)  98.3 F (36.8 C)  TempSrc:  Axillary  Oral  SpO2:  97% 96% 98%  Weight:      Height:        Intake/Output Summary (Last 24 hours) at 12/29/2020 1226 Last data filed at 12/29/2020 0601 Gross per 24 hour  Intake 1160.33 ml  Output 950 ml  Net 210.33 ml   Filed Weights   12/27/20 1751 12/28/20 0551  Weight: 90.9 kg 91.5 kg    Examination:  GENERAL: No apparent distress.  Nontoxic. HEENT: MMM.  Vision and hearing grossly intact.  NECK: Supple.  No apparent JVD.  RESP: 97% on 3 L.  No IWOB.  Diminished aeration and crackles. CVS: IR IR at 140. Heart sounds normal.  ABD/GI/GU: BS+. Abd soft, NTND.  MSK/EXT:  Moves  extremities. No apparent deformity. No edema.  SKIN: no apparent skin lesion or wound NEURO: Awake, alert and oriented appropriately.  No apparent focal neuro deficit. PSYCH: Calm. Normal affect.  Procedures:  None  Microbiology summarized: COVID-19 PCR negative. Respiratory culture pending.  Assessment & Plan: Acute on chronic respiratory failure with hypoxia and hypercapnia-multifactorial including CHF exacerbation, COPD exacerbation, with underlying ILD and anemia.  CTA raises some question for pneumonia but.  Procalcitonin is negative.  She is on 3 L at baseline.  Initial ABG with acute on chronic respiratory acidosis favoring obstructive etiology.  ABG improved after BiPAP.  Breathing improved although she still have diminished aeration bilaterally -Treat treatable causes as below -Appreciate input by PCCM -Continue alternating BiPAP and supplemental oxygen -Could benefit from sleep study  Acute COPD exacerbation/interstitial lung disease-her ABG favors obstructive etiology raising concern for COPD exacerbation.  Not sure if she had a sleep apnea.  CTA negative for PE but raises concern for possible pneumonia.  However, procalcitonin negative.  Ceftriaxone discontinued. -Continue azithromycin for COPD exacerbation -Continue IV Solu-Medrol -On Pulmicort, scheduled Xopenex and ipratropium -Follow respiratory culture -BiPAP and supplemental oxygen as above   Acute on chronic diastolic CHF: she has shortness of breath and orthopnea although the latter seem to be chronic.  Does not look fluid overloaded clinically.  However, she has some crackles on exam.  CXR with cardiomegaly and pulmonary edema with bilateral pleural  effusions.  BNP 329 (higher than baseline).  TTE with LVEF of 60 to 65% and moderate LAE but no other significant finding.  About 950 cc +6 unmeasured voids in the last 24 hours.  Renal function stable. -Continue IV Lasix 40 mg twice daily -Follow further recommendation  by cardiology -Monitor fluid status, renal functions and electrolytes -Sodium and fluid restrictions  Atrial fibrillation/atrial flutter with RVR.  She is in RVR with heart rate in 140 this morning.   TTE as above.  She was recently on Zio patch and noted to have 35% A. fib burden. Seen by cardiology the day prior to admission and started on Eliquis.  Her Cardizem  was increased from 120 to 240 mg as well. -Cardiology managing-now on p.o. Cardizem 120 mg 4 times daily. -Continue home Eliquis -Monitor and replenish electrolytes as appropriate  Elevated troponin in patient with history of CAD/CABG: Pattern suggestive for demand ischemia.  She has no chest pain.  Her TTE is reassuring. -Cardiology on board.  Symptomatic iron deficiency and vitamin B12 deficiency anemia: Iron sat 7%.  TIBC 349.  Vitamin B12 162.  She denies melena or hematochezia.  Recent Labs    11/22/20 1218 12/27/20 0136 12/27/20 0239 12/27/20 0240 12/27/20 0704 12/27/20 1244 12/28/20 0327 12/28/20 1018 12/28/20 1753 12/29/20 0215  HGB 12.1 8.7* 9.9* 9.9* 9.2* 8.8* 7.8* 8.6* 10.0* 9.8*  -Transfused 1 unit on 2/25 -IV Feraheme x1 on 2/25.  Consider additional dose after 3 days. -Subcu vitamin B12 1000 mcg daily for 1 week followed by weekly for 1 months and then monthly -Start p.o. ferrous sulfate on discharge.  Leukocytosis/bandemia:  Likely demargination from steroid -Continue monitoring  CKD-3A/azotemia: Does not meet criteria for AKI.  Stable. Recent Labs    01/18/20 1110 06/05/20 1837 06/06/20 1722 06/08/20 0729 11/22/20 1218 12/27/20 0136 12/27/20 0239 12/28/20 0327 12/29/20 0215  BUN 23 28* 23 32* 32* 34* 39* 31* 31*  CREATININE 1.19* 1.16* 1.04* 1.12* 1.37* 1.57* 1.60* 1.41* 1.46*  -Continue monitoring  Radiculopathic pain in right leg.  No red flags.  She denies urine retention or bowel accidents.  Improved. -Consider gabapentin if pain worse. -Continue home tramadol  Chronic diarrhea:  She has diarrhea for quite some time before recent antibiotic.  No fever or abdominal pain.  Abdominal exam benign.  Low suspicion for C. difficile. -Continue Imodium as needed  Essential hypertension:  Normotensive. -Now on p.o. Cardizem and IV Lasix  Recent COVID-19 infection:  Tested positive on 1/20.  Vaccinated but has not had a booster.  COVID-19 PCR negative this admission. -No indication for isolation  Hypothyroidism: TSH 1.77 on 11/22/2020 -Continue levothyroxine 125 mcg daily  Hyperlipidemia -Continue statin when able  Psoriatic arthritis -Continue leflunomide when able -Now on steroid for COPD  GERD -Continue Protonix 40 mg daily  Goal of care: DNR, no ACLS meds, no DCCV but intubation and NIPPV  Debility/physical deconditioning -PT/OT eval  Morbid obesity Body mass index is 40.74 kg/m.  -Consider GLP-1 inhibitors if no contraindication       DVT prophylaxis:   apixaban (ELIQUIS) tablet 5 mg  Code Status: Partial code as above Family Communication: Updated patient's daughter at bedside on 2/25.  None at bedside today.. Level of care: Progressive Status is: Inpatient  Remains inpatient appropriate because:Hemodynamically unstable, Unsafe d/c plan, IV treatments appropriate due to intensity of illness or inability to take PO and Inpatient level of care appropriate due to severity of illness   Dispo: The patient is from:  Home              Anticipated d/c is to: Home              Patient currently is not medically stable to d/c.   Difficult to place patient No       Consultants:  Cardiology Pulmonology   Sch Meds:  Scheduled Meds: . apixaban  5 mg Oral BID  . arformoterol  15 mcg Nebulization BID  . atorvastatin  40 mg Oral Daily  . budesonide (PULMICORT) nebulizer solution  0.5 mg Nebulization BID  . citalopram  40 mg Oral Daily  . cyanocobalamin  1,000 mcg Intramuscular Daily   Followed by  . [START ON 01/04/2021] cyanocobalamin  1,000  mcg Intramuscular Weekly   Followed by  . [START ON 02/01/2021] cyanocobalamin  1,000 mcg Intramuscular Q30 days  . diltiazem  120 mg Oral QID  . furosemide  40 mg Intravenous BID  . ipratropium  0.5 mg Nebulization TID  . leflunomide  20 mg Oral Daily  . levalbuterol  1.25 mg Nebulization TID  . levothyroxine  125 mcg Oral QAC breakfast  . methylPREDNISolone (SOLU-MEDROL) injection  40 mg Intravenous Q12H  . montelukast  10 mg Oral QHS  . pantoprazole  40 mg Oral QAC breakfast  . sodium chloride flush  3 mL Intravenous Q12H   Continuous Infusions: . sodium chloride    . azithromycin 500 mg (12/29/20 1110)   PRN Meds:.sodium chloride, acetaminophen, cyclobenzaprine, guaiFENesin, loperamide, ondansetron (ZOFRAN) IV, sodium chloride flush, traMADol, triamcinolone  Antimicrobials: Anti-infectives (From admission, onward)   Start     Dose/Rate Route Frequency Ordered Stop   12/27/20 0915  cefTRIAXone (ROCEPHIN) 1 g in sodium chloride 0.9 % 100 mL IVPB  Status:  Discontinued        1 g 200 mL/hr over 30 Minutes Intravenous Every 24 hours 12/27/20 0903 12/28/20 1759   12/27/20 0915  azithromycin (ZITHROMAX) 500 mg in sodium chloride 0.9 % 250 mL IVPB        500 mg 250 mL/hr over 60 Minutes Intravenous Every 24 hours 12/27/20 0903         I have personally reviewed the following labs and images: CBC: Recent Labs  Lab 12/27/20 0136 12/27/20 0239 12/27/20 1244 12/28/20 0327 12/28/20 1018 12/28/20 1753 12/29/20 0215  WBC 10.6*  --   --  7.5  --   --  6.8  NEUTROABS 8.2*  --   --  5.5  --   --   --   HGB 8.7*   < > 8.8* 7.8* 8.6* 10.0* 9.8*  HCT 31.6*   < > 26.0* 26.4* 29.0* 33.4* 31.8*  MCV 99.1  --   --  95.3  --   --  93.5  PLT 213  --   --  174  --   --  169   < > = values in this interval not displayed.   BMP &GFR Recent Labs  Lab 12/27/20 0136 12/27/20 0239 12/27/20 0240 12/27/20 0338 12/27/20 0704 12/27/20 1244 12/28/20 0327 12/29/20 0215  NA 139 138 138  --   137 138 141 141  K 4.9 4.9 4.9  --  5.0 4.6 4.7 3.9  CL 98 99  --   --   --   --  94* 94*  CO2 32  --   --   --   --   --  38* 34*  GLUCOSE 147* 141*  --   --   --   --  104* 194*  BUN 34* 39*  --   --   --   --  31* 31*  CREATININE 1.57* 1.60*  --   --   --   --  1.41* 1.46*  CALCIUM 8.9  --   --   --   --   --  9.2 9.4  MG  --   --   --  2.0  --   --   --  1.8  PHOS  --   --   --   --   --   --   --  2.8   Estimated Creatinine Clearance: 33.9 mL/min (A) (by C-G formula based on SCr of 1.46 mg/dL (H)). Liver & Pancreas: Recent Labs  Lab 12/27/20 0136 12/29/20 0215  AST 25  --   ALT 24  --   ALKPHOS 55  --   BILITOT 0.4  --   PROT 6.2*  --   ALBUMIN 3.3* 3.1*   No results for input(s): LIPASE, AMYLASE in the last 168 hours. No results for input(s): AMMONIA in the last 168 hours. Diabetic: No results for input(s): HGBA1C in the last 72 hours. No results for input(s): GLUCAP in the last 168 hours. Cardiac Enzymes: No results for input(s): CKTOTAL, CKMB, CKMBINDEX, TROPONINI in the last 168 hours. Recent Labs    01/18/20 1110  PROBNP 331*   Coagulation Profile: No results for input(s): INR, PROTIME in the last 168 hours. Thyroid Function Tests: Recent Labs    12/28/20 0327  TSH 0.752   Lipid Profile: No results for input(s): CHOL, HDL, LDLCALC, TRIG, CHOLHDL, LDLDIRECT in the last 72 hours. Anemia Panel: Recent Labs    12/28/20 0327  VITAMINB12 162*  FOLATE 9.9  FERRITIN 46  TIBC 349  IRON 25*  RETICCTPCT 1.8   Urine analysis:    Component Value Date/Time   COLORURINE YELLOW 05/31/2013 1412   APPEARANCEUR CLEAR 05/31/2013 1412   LABSPEC 1.027 05/31/2013 1412   PHURINE 5.0 05/31/2013 1412   GLUCOSEU NEGATIVE 05/31/2013 1412   HGBUR NEGATIVE 05/31/2013 1412   HGBUR negative 07/18/2010 0941   BILIRUBINUR SMALL (A) 05/31/2013 1412   BILIRUBINUR n 08/10/2012 1131   KETONESUR NEGATIVE 05/31/2013 1412   PROTEINUR 30 (A) 05/31/2013 1412   UROBILINOGEN 0.2  05/31/2013 1412   NITRITE NEGATIVE 05/31/2013 1412   LEUKOCYTESUR NEGATIVE 05/31/2013 1412   Sepsis Labs: Invalid input(s): PROCALCITONIN, LACTICIDVEN  Microbiology: Recent Results (from the past 240 hour(s))  SARS CORONAVIRUS 2 (TAT 6-24 HRS) Nasopharyngeal Nasopharyngeal Swab     Status: None   Collection Time: 12/27/20  4:10 AM   Specimen: Nasopharyngeal Swab  Result Value Ref Range Status   SARS Coronavirus 2 NEGATIVE NEGATIVE Final    Comment: (NOTE) SARS-CoV-2 target nucleic acids are NOT DETECTED.  The SARS-CoV-2 RNA is generally detectable in upper and lower respiratory specimens during the acute phase of infection. Negative results do not preclude SARS-CoV-2 infection, do not rule out co-infections with other pathogens, and should not be used as the sole basis for treatment or other patient management decisions. Negative results must be combined with clinical observations, patient history, and epidemiological information. The expected result is Negative.  Fact Sheet for Patients: HairSlick.no  Fact Sheet for Healthcare Providers: quierodirigir.com  This test is not yet approved or cleared by the [place] FDA and  has been authorized for detection and/or diagnosis of SARS-CoV-2 by FDA under an Emergency Use Authorization (EUA). This EUA will remain  in effect (  meaning this test can be used) for the duration of the COVID-19 declaration under Se ction 564(b)(1) of the Act, 21 U.S.C. section 360bbb-3(b)(1), unless the authorization is terminated or revoked sooner.  Performed at [facility], [address] [place], [place] [address]   Expectorated Sputum Assessment w Gram Stain, Rflx to Resp Cult     Status: None   Collection Time: 12/28/20  2:49 PM   Specimen: Expectorated Sputum  Result Value Ref Range Status   Specimen Description EXPECTORATED SPUTUM  Final   Special Requests NONE  Final   Sputum  evaluation   Final    THIS SPECIMEN IS ACCEPTABLE FOR SPUTUM CULTURE Performed at [facility], [address] [place], [place] [address]    Report Status 12/28/2020 FINAL  Final  Culture, Respiratory w Gram Stain     Status: None (Preliminary result)   Collection Time: 12/28/20  2:49 PM  Result Value Ref Range Status   Specimen Description EXPECTORATED SPUTUM  Final   Special Requests NONE Reflexed from [id number]  Final   Gram Stain   Final    ABUNDANT WBC PRESENT,BOTH PMN AND MONONUCLEAR FEW GRAM POSITIVE COCCI Performed at [facility], [address] [place], [place] [address]    Culture PENDING  Incomplete   Report Status PENDING  Incomplete    Radiology Studies: DG CHEST PORT 1 VIEW  Result Date: 12/29/2020 CLINICAL DATA:  Shortness of breath. EXAM: PORTABLE CHEST 1 VIEW COMPARISON:  12/27/2020 FINDINGS: Sternotomy wires unchanged. Lungs are somewhat hypoinflated as lordotic technique is demonstrated. Elevation the right hemidiaphragm. Opacification over the right base with blunting of the costophrenic angle likely due to effusion with associated atelectasis although infection is possible. Mild hazy prominence of the central pulmonary vessels likely mild vascular congestion. Stable cardiomegaly. Remainder of the exam is unchanged. IMPRESSION: 1. Right base opacification likely due to effusion with associated atelectasis, although infection is possible. 2. Stable cardiomegaly with suggestion of mild vascular congestion. Electronically Signed   By: Elberta Fortis M.D.   On: 12/29/2020 10:26   VAS [place] LOWER EXTREMITY VENOUS (DVT)  Result Date: 12/29/2020  Lower Venous DVT Study Indications: Edema.  Comparison Study: no prior Performing Technologist: Blanch Media RVS  Examination Guidelines: A complete evaluation includes B-mode imaging, spectral Doppler, color Doppler, and power Doppler as needed of all accessible portions of each vessel. Bilateral testing is considered an  integral part of a complete examination. Limited examinations for reoccurring indications may be performed as noted. The reflux portion of the exam is performed with the patient in reverse Trendelenburg.  +---------+---------------+---------+-----------+----------+--------------+ RIGHT    CompressibilityPhasicitySpontaneityPropertiesThrombus Aging +---------+---------------+---------+-----------+----------+--------------+ CFV      Full           Yes      Yes                                 +---------+---------------+---------+-----------+----------+--------------+ SFJ      Full                                                        +---------+---------------+---------+-----------+----------+--------------+ FV Prox  Full                                                        +---------+---------------+---------+-----------+----------+--------------+  FV Mid   Full                                                        +---------+---------------+---------+-----------+----------+--------------+ FV Distal               Yes      Yes                                 +---------+---------------+---------+-----------+----------+--------------+ PFV      Full                                                        +---------+---------------+---------+-----------+----------+--------------+ POP      Full           Yes      Yes                                 +---------+---------------+---------+-----------+----------+--------------+ PTV      Full                                                        +---------+---------------+---------+-----------+----------+--------------+ PERO     Full                                                        +---------+---------------+---------+-----------+----------+--------------+   +---------+---------------+---------+-----------+----------+--------------+ LEFT     CompressibilityPhasicitySpontaneityPropertiesThrombus  Aging +---------+---------------+---------+-----------+----------+--------------+ CFV      Full           Yes      Yes                                 +---------+---------------+---------+-----------+----------+--------------+ SFJ      Full                                                        +---------+---------------+---------+-----------+----------+--------------+ FV Prox  Full                                                        +---------+---------------+---------+-----------+----------+--------------+ FV Mid   Full                                                        +---------+---------------+---------+-----------+----------+--------------+  FV DistalFull                                                        +---------+---------------+---------+-----------+----------+--------------+ PFV      Full                                                        +---------+---------------+---------+-----------+----------+--------------+ POP      Full           Yes      Yes                                 +---------+---------------+---------+-----------+----------+--------------+ PTV      Full                                                        +---------+---------------+---------+-----------+----------+--------------+ PERO     Full                                                        +---------+---------------+---------+-----------+----------+--------------+     Summary: BILATERAL: - No evidence of deep vein thrombosis seen in the lower extremities, bilaterally. - No evidence of superficial venous thrombosis in the lower extremities, bilaterally. -No evidence of popliteal cyst, bilaterally.   *See table(s) above for measurements and observations. Electronically signed by Fabienne Bruns MD on 12/29/2020 at 10:41:22 AM.    Final      Hardy Harcum T. Sherrin Stahle [facility]  If 7PM-7AM, please contact night-coverage [email or web address] 12/29/2020, 12:26 PM

## 2020-12-30 LAB — RENAL FUNCTION PANEL
Albumin: 3.4 g/dL — ABNORMAL LOW (ref 3.5–5.0)
Anion gap: 14 (ref 5–15)
BUN: 36 mg/dL — ABNORMAL HIGH (ref 8–23)
CO2: 36 mmol/L — ABNORMAL HIGH (ref 22–32)
Calcium: 9.7 mg/dL (ref 8.9–10.3)
Chloride: 90 mmol/L — ABNORMAL LOW (ref 98–111)
Creatinine, Ser: 1.56 mg/dL — ABNORMAL HIGH (ref 0.44–1.00)
GFR, Estimated: 35 mL/min — ABNORMAL LOW (ref >60)
Glucose, Bld: 159 mg/dL — ABNORMAL HIGH (ref 70–99)
Phosphorus: 4.1 mg/dL (ref 2.5–4.6)
Potassium: 4.5 mmol/L (ref 3.5–5.1)
Sodium: 140 mmol/L (ref 135–145)

## 2020-12-30 LAB — HEMOGLOBIN AND HEMATOCRIT, BLOOD
HCT: 34.1 % — ABNORMAL LOW (ref 36.0–46.0)
Hemoglobin: 10.1 g/dL — ABNORMAL LOW (ref 12.0–15.0)

## 2020-12-30 LAB — MAGNESIUM: Magnesium: 2.1 mg/dL (ref 1.7–2.4)

## 2020-12-30 MED ORDER — TORSEMIDE 20 MG PO TABS
20.0000 mg | ORAL_TABLET | Freq: Every day | ORAL | Status: DC
  Administered 2020-12-30 – 2020-12-31 (×2): 20 mg via ORAL
  Filled 2020-12-30 (×2): qty 1

## 2020-12-30 MED ORDER — AZITHROMYCIN 250 MG PO TABS
500.0000 mg | ORAL_TABLET | Freq: Every day | ORAL | Status: DC
  Administered 2020-12-31: 500 mg via ORAL
  Filled 2020-12-30: qty 2

## 2020-12-30 MED ORDER — UMECLIDINIUM BROMIDE 62.5 MCG/INH IN AEPB
1.0000 | INHALATION_SPRAY | Freq: Every day | RESPIRATORY_TRACT | Status: DC
  Administered 2020-12-31: 1 via RESPIRATORY_TRACT
  Filled 2020-12-30: qty 7

## 2020-12-30 MED ORDER — NYSTATIN 100000 UNIT/GM EX POWD
Freq: Three times a day (TID) | CUTANEOUS | Status: DC
  Administered 2020-12-30: 1 via TOPICAL
  Filled 2020-12-30: qty 15

## 2020-12-30 MED ORDER — ALBUTEROL SULFATE (2.5 MG/3ML) 0.083% IN NEBU: 2.5000 mg | INHALATION_SOLUTION | Freq: Four times a day (QID) | RESPIRATORY_TRACT | Status: DC | PRN

## 2020-12-30 MED ORDER — DILTIAZEM HCL ER COATED BEADS 240 MG PO CP24
240.0000 mg | ORAL_CAPSULE | Freq: Two times a day (BID) | ORAL | Status: DC
  Administered 2020-12-30 – 2020-12-31 (×2): 240 mg via ORAL
  Filled 2020-12-30 (×2): qty 1

## 2020-12-30 NOTE — Progress Notes (Signed)
Cardiology Progress Note  Patient ID: [name] MRN: [id number] DOB: Jun 29, 1946 Date of Encounter: 12/30/2020  Primary Cardiologist: Tobias Alexander, MD  Subjective   Chief Complaint: Shortness of breath  HPI: She is in sinus rhythm today.  Breathing improving slowly.  Still not back to baseline.  Euvolemic on my exam.  ROS:  All other ROS reviewed and negative. Pertinent positives noted in the HPI.     Inpatient Medications  Scheduled Meds: . apixaban  5 mg Oral BID  . arformoterol  15 mcg Nebulization BID  . atorvastatin  40 mg Oral Daily  . budesonide (PULMICORT) nebulizer solution  0.5 mg Nebulization BID  . citalopram  40 mg Oral Daily  . cyanocobalamin  1,000 mcg Intramuscular Daily   Followed by  . [START ON 01/04/2021] cyanocobalamin  1,000 mcg Intramuscular Weekly   Followed by  . [START ON 02/01/2021] cyanocobalamin  1,000 mcg Intramuscular Q30 days  . diltiazem  240 mg Oral BID  . ipratropium  0.5 mg Nebulization TID  . leflunomide  20 mg Oral Daily  . levalbuterol  1.25 mg Nebulization TID  . levothyroxine  125 mcg Oral QAC breakfast  . methylPREDNISolone (SOLU-MEDROL) injection  40 mg Intravenous Q12H  . montelukast  10 mg Oral QHS  . pantoprazole  40 mg Oral QAC breakfast  . sodium chloride flush  3 mL Intravenous Q12H  . torsemide  20 mg Oral Daily   Continuous Infusions: . sodium chloride    . azithromycin 500 mg (12/30/20 1034)   PRN Meds: sodium chloride, acetaminophen, cyclobenzaprine, guaiFENesin, loperamide, ondansetron (ZOFRAN) IV, sodium chloride flush, traMADol, triamcinolone   Vital Signs   Vitals:   12/30/20 0500 12/30/20 0744 12/30/20 0800 12/30/20 0902  BP: 122/85  130/86   Pulse: 81     Resp: (!) 22     Temp: 99 F (37.2 C)   98.7 F (37.1 C)  TempSrc: Axillary   Oral  SpO2: 99% 98% 97%   Weight: 89.3 kg     Height:       No intake or output data in the 24 hours ending 12/30/20 1035 Last 3 Weights 12/30/2020 12/28/2020  12/27/2020  Weight (lbs) 196 lb 13.9 oz 201 lb 11.5 oz 200 lb 6.4 oz  Weight (kg) 89.3 kg 91.5 kg 90.9 kg      Telemetry  Overnight telemetry shows sinus rhythm in the 90s, which I personally reviewed.   ECG  The most recent ECG shows atrial fibrillation, which I personally reviewed.   Physical Exam   Vitals:   12/30/20 0500 12/30/20 0744 12/30/20 0800 12/30/20 0902  BP: 122/85  130/86   Pulse: 81     Resp: (!) 22     Temp: 99 F (37.2 C)   98.7 F (37.1 C)  TempSrc: Axillary   Oral  SpO2: 99% 98% 97%   Weight: 89.3 kg     Height:       No intake or output data in the 24 hours ending 12/30/20 1035  Last 3 Weights 12/30/2020 12/28/2020 12/27/2020  Weight (lbs) 196 lb 13.9 oz 201 lb 11.5 oz 200 lb 6.4 oz  Weight (kg) 89.3 kg 91.5 kg 90.9 kg    Body mass index is 39.76 kg/m.  General: Well nourished, well developed, in no acute distress Head: Atraumatic, normal size  Eyes: PEERLA, EOMI  Neck: Supple, no JVD Endocrine: No thryomegaly Cardiac: Normal S1, S2; RRR; no murmurs, rubs, or gallops Lungs: Poor airway movement noted  Abd: Soft, nontender, no hepatomegaly  Ext: No edema, pulses 2+ Musculoskeletal: No deformities, BUE and BLE strength normal and equal Skin: Warm and dry, no rashes   Neuro: Alert and oriented to person, place, time, and situation, CNII-XII grossly intact, no focal deficits  Psych: Normal mood and affect   Labs  High Sensitivity Troponin:   Recent Labs  Lab 12/27/20 0136 12/27/20 0338  TROPONINIHS 33* 34*     Cardiac EnzymesNo results for input(s): TROPONINI in the last 168 hours. No results for input(s): TROPIPOC in the last 168 hours.  Chemistry Recent Labs  Lab 12/27/20 0136 12/27/20 0239 12/28/20 0327 12/29/20 0215 12/30/20 0151  NA 139   < > 141 141 140  K 4.9   < > 4.7 3.9 4.5  CL 98   < > 94* 94* 90*  CO2 32  --  38* 34* 36*  GLUCOSE 147*   < > 104* 194* 159*  BUN 34*   < > 31* 31* 36*  CREATININE 1.57*   < > 1.41* 1.46* 1.56*   CALCIUM 8.9  --  9.2 9.4 9.7  PROT 6.2*  --   --   --   --   ALBUMIN 3.3*  --   --  3.1* 3.4*  AST 25  --   --   --   --   ALT 24  --   --   --   --   ALKPHOS 55  --   --   --   --   BILITOT 0.4  --   --   --   --   GFRNONAA 35*  --  39* 38* 35*  ANIONGAP 9  --  9 13 14    < > = values in this interval not displayed.    Hematology Recent Labs  Lab 12/27/20 0136 12/27/20 0239 12/28/20 0327 12/28/20 1018 12/28/20 1753 12/29/20 0215 12/30/20 0151  WBC 10.6*  --  7.5  --   --  6.8  --   RBC 3.19*  --  2.77*  2.82*  --   --  3.40*  --   HGB 8.7*   < > 7.8*   < > 10.0* 9.8* 10.1*  HCT 31.6*   < > 26.4*   < > 33.4* 31.8* 34.1*  MCV 99.1  --  95.3  --   --  93.5  --   MCH 27.3  --  28.2  --   --  28.8  --   MCHC 27.5*  --  29.5*  --   --  30.8  --   RDW 15.8*  --  15.7*  --   --  15.5  --   PLT 213  --  174  --   --  169  --    < > = values in this interval not displayed.   BNP Recent Labs  Lab 12/27/20 0136  BNP 329.1*    DDimer  Recent Labs  Lab 12/28/20 1238  DDIMER 0.79*     Radiology  DG CHEST PORT 1 VIEW  Result Date: 12/29/2020 CLINICAL DATA:  Shortness of breath. EXAM: PORTABLE CHEST 1 VIEW COMPARISON:  12/27/2020 FINDINGS: Sternotomy wires unchanged. Lungs are somewhat hypoinflated as lordotic technique is demonstrated. Elevation the right hemidiaphragm. Opacification over the right base with blunting of the costophrenic angle likely due to effusion with associated atelectasis although infection is possible. Mild hazy prominence of the central pulmonary vessels likely mild vascular congestion. Stable cardiomegaly. Remainder  of the exam is unchanged. IMPRESSION: 1. Right base opacification likely due to effusion with associated atelectasis, although infection is possible. 2. Stable cardiomegaly with suggestion of mild vascular congestion. Electronically Signed   By: Elberta Fortis M.D.   On: 12/29/2020 10:26   VAS [place] LOWER EXTREMITY VENOUS (DVT)  Result Date:  12/29/2020  Lower Venous DVT Study Indications: Edema.  Comparison Study: no prior Performing Technologist: Blanch Media RVS  Examination Guidelines: A complete evaluation includes B-mode imaging, spectral Doppler, color Doppler, and power Doppler as needed of all accessible portions of each vessel. Bilateral testing is considered an integral part of a complete examination. Limited examinations for reoccurring indications may be performed as noted. The reflux portion of the exam is performed with the patient in reverse Trendelenburg.  +---------+---------------+---------+-----------+----------+--------------+ RIGHT    CompressibilityPhasicitySpontaneityPropertiesThrombus Aging +---------+---------------+---------+-----------+----------+--------------+ CFV      Full           Yes      Yes                                 +---------+---------------+---------+-----------+----------+--------------+ SFJ      Full                                                        +---------+---------------+---------+-----------+----------+--------------+ FV Prox  Full                                                        +---------+---------------+---------+-----------+----------+--------------+ FV Mid   Full                                                        +---------+---------------+---------+-----------+----------+--------------+ FV Distal               Yes      Yes                                 +---------+---------------+---------+-----------+----------+--------------+ PFV      Full                                                        +---------+---------------+---------+-----------+----------+--------------+ POP      Full           Yes      Yes                                 +---------+---------------+---------+-----------+----------+--------------+ PTV      Full                                                         +---------+---------------+---------+-----------+----------+--------------+  PERO     Full                                                        +---------+---------------+---------+-----------+----------+--------------+   +---------+---------------+---------+-----------+----------+--------------+ LEFT     CompressibilityPhasicitySpontaneityPropertiesThrombus Aging +---------+---------------+---------+-----------+----------+--------------+ CFV      Full           Yes      Yes                                 +---------+---------------+---------+-----------+----------+--------------+ SFJ      Full                                                        +---------+---------------+---------+-----------+----------+--------------+ FV Prox  Full                                                        +---------+---------------+---------+-----------+----------+--------------+ FV Mid   Full                                                        +---------+---------------+---------+-----------+----------+--------------+ FV DistalFull                                                        +---------+---------------+---------+-----------+----------+--------------+ PFV      Full                                                        +---------+---------------+---------+-----------+----------+--------------+ POP      Full           Yes      Yes                                 +---------+---------------+---------+-----------+----------+--------------+ PTV      Full                                                        +---------+---------------+---------+-----------+----------+--------------+ PERO     Full                                                        +---------+---------------+---------+-----------+----------+--------------+  Summary: BILATERAL: - No evidence of deep vein thrombosis seen in the lower extremities, bilaterally. - No evidence of  superficial venous thrombosis in the lower extremities, bilaterally. -No evidence of popliteal cyst, bilaterally.   *See table(s) above for measurements and observations. Electronically signed by Fabienne Bruns MD on 12/29/2020 at 10:41:22 AM.    Final     Cardiac Studies  TTE 12/27/2020  1. Left ventricular ejection fraction, by estimation, is 60 to 65%. The  left ventricle has normal function. The left ventricle has no regional  wall motion abnormalities. There is severe left ventricular hypertrophy.  Left ventricular diastolic parameters  are indeterminate.  2. Right ventricular systolic function is normal. The right ventricular  size is normal.  3. Left atrial size was moderately dilated.  4. The mitral valve is normal in structure. No evidence of mitral valve  regurgitation. No evidence of mitral stenosis. Moderate mitral annular  calcification.  5. The aortic valve is normal in structure. Aortic valve regurgitation is  not visualized. No aortic stenosis is present.  6. The inferior vena cava is dilated in size with <50% respiratory  variability, suggesting right atrial pressure of 15 mmHg.   Patient Profile  [name] is a 75 y.o. female with non-STEMI status post CABG, interstitial lung disease/COPD/chronic respiratory failure on 3 L home O2, asthma, recent COVID-19 infection, diastolic heart failure, CKD stage III, prior intracerebral hemorrhage, anemia who was admitted on 12/27/2020 with acute hypoxic/hypercapnic respiratory failure, atrial fibrillation and diastolic heart failure.  Assessment & Plan   1.  Acute on chronic diastolic heart failure -Appears euvolemic to me.  Transition to 20 mg of torsemide daily. -Lung examination more consistent with COPD. -No lower extremity edema.  2.  Atrial fibrillation with RVR -Back in sinus rhythm with treatment of lungs.  Transition back to home dose of diltiazem extended release 240 mg twice a day. -Continue Eliquis  5 mg twice daily. -She does have a history of CAD status post CABG.  Given anemia would just recommend Eliquis. -She may want to be considered for left atrial appendage occlusion in the future.  This would remove her need for anticoagulation.  3.  CAD status post CABG -She was not on aspirin at home.  She can just go back on Eliquis.  She can discuss aspirin in the outpatient setting with her primary cardiologist. -Continue home statin.  [facility] will sign off.   Medication Recommendations: Diltiazem extended release 240 mg twice a day, torsemide 20 mg daily, Eliquis 5 mg twice daily, resume all other home medications Other recommendations (labs, testing, etc): None Follow up as an outpatient: She already has outpatient follow-up with her primary cardiologist scheduled.  For questions or updates, please contact [facility] Please consult [email or web address] for contact info under   Time Spent with Patient: I have spent a total of 25 minutes with patient reviewing hospital notes, telemetry, EKGs, labs and examining the patient as well as establishing an assessment and plan that was discussed with the patient.  > 50% of time was spent in direct patient care.    Signed, Lenna Gilford. Flora Lipps, MD [facility]  12/30/2020 10:35 AM

## 2020-12-30 NOTE — Progress Notes (Addendum)
PROGRESS NOTE  [name] VZD:[id number] DOB: 07/31/1946   PCP: [name], [name], MD  Patient is from: Home.  Lives with family.  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 fib on Eliquis, CKD-3, psoriatic arthritis, sciatica, recent COVID-19 infection presenting with progressive shortness of breath, near syncope and RLE radiculopathic pain, and admitted for acute on chronic RF and near syncope in the setting of acute on chronic diastolic CHF, possible COPD exacerbation and anemia.  She was a started on IV Lasix, BiPAP, antibiotics and nebulizers. Cardiology consulted on admission and evaluated patient.  PCCM consulted and added steroid.  Subjective: Seen and examined.  Daughter at the bedside.  Patient has no new complaint except chronic shortness of breath.  Objective: Vitals:   12/30/20 0500 12/30/20 0744 12/30/20 0800 12/30/20 0902  BP: 122/85  130/86   Pulse: 81     Resp: (!) 22     Temp: 99 F (37.2 C)   98.7 F (37.1 C)  TempSrc: Axillary   Oral  SpO2: 99% 98% 97%   Weight: 89.3 kg     Height:       No intake or output data in the 24 hours ending 12/30/20 1200 Filed Weights   12/27/20 1751 12/28/20 0551 12/30/20 0500  Weight: 90.9 kg 91.5 kg 89.3 kg    Examination:  General exam: Appears calm and comfortable  Respiratory system: Slightly diminished breath sounds, no wheezes or crackles. Respiratory effort normal. Cardiovascular system: S1 & S2 heard, RRR. No JVD, murmurs, rubs, gallops or clicks. No pedal edema. Gastrointestinal system: Abdomen is nondistended, soft and nontender. No organomegaly or masses felt. Normal bowel sounds heard. Central nervous system: Alert and oriented. No focal neurological deficits. Extremities: Symmetric 5 x 5 power. Skin: No rashes,  lesions or ulcers.  Psychiatry: Judgement and insight appear normal. Mood & affect appropriate.   Procedures:  None  Microbiology summarized: COVID-19 PCR negative. Respiratory culture pending.  Assessment & Plan: Acute on chronic respiratory failure with hypoxia and hypercapnia-multifactorial including CHF exacerbation, COPD exacerbation, with underlying ILD and anemia.  CTA raises some question for pneumonia but.  Procalcitonin is negative.  She is on 3 L at baseline.  Initial ABG with acute on chronic respiratory acidosis favoring obstructive etiology.  ABG improved after BiPAP.  Breathing improved although she still have diminished aeration bilaterally -Treat treatable causes as below -Appreciate input by PCCM.  To be seen by PCCM on Monday. -Continue alternating BiPAP and supplemental oxygen -Could benefit from sleep study.  Currently on 3 L which is her baseline.  She is very comfortable and her shortness of breath is chronic.Continue azithromycin, Solu-Medrol and rest of the respiratory medications started by pulmonology which include Pulmicort, Xopenex and ipratropium.Marland Kitchen  Acute on chronic diastolic CHF: she has shortness of breath and orthopnea although the latter seem to be chronic. CXR with cardiomegaly and pulmonary edema with bilateral pleural effusions.  BNP 329 (higher than baseline).  TTE with LVEF of 60 to 65% and moderate LAE but no other significant finding.  Appears euvolemic.  Transitioned to torsemide 20 mg p.o. daily.  IV Lasix discontinued.  Continue fluid restriction.  Atrial fibrillation/atrial flutter with RVR. She was recently on Zio patch and noted to have 35% A. fib burden. Seen by cardiology the day prior to admission and started on Eliquis.  Her Cardizem  was increased from 120 to 240 mg as well.  She was then switched to short acting Cardizem here.  Now she is in sinus rhythm.  Cardizem switched to long-acting by cardiology and will continue Eliquis.  Elevated  troponin in patient with history of CAD/CABG: Pattern suggestive for demand ischemia.  She has no chest pain.  Her TTE is reassuring. -Cardiology on board.  Symptomatic iron deficiency and vitamin B12 deficiency anemia: Iron sat 7%.  TIBC 349.  Vitamin B12 162.  She denies melena or hematochezia.  Recent Labs    12/27/20 0136 12/27/20 0239 12/27/20 0240 12/27/20 0704 12/27/20 1244 12/28/20 0327 12/28/20 1018 12/28/20 1753 12/29/20 0215 12/30/20 0151  HGB 8.7* 9.9* 9.9* 9.2* 8.8* 7.8* 8.6* 10.0* 9.8* 10.1*  -Transfused 1 unit on 2/25 -IV Feraheme x1 on 2/25.  -Subcu vitamin B12 1000 mcg daily for 1 week followed by weekly for 1 months and then monthly.  Hemoglobin is stable. -Start p.o. ferrous sulfate on discharge.  Leukocytosis/bandemia:  Likely demargination from steroid.  Resolved.  CKD stage IIIb: Stable. Recent Labs    01/18/20 1110 06/05/20 1837 06/06/20 1722 06/08/20 0729 11/22/20 1218 12/27/20 0136 12/27/20 0239 12/28/20 0327 12/29/20 0215 12/30/20 0151  BUN 23 28* 23 32* 32* 34* 39* 31* 31* 36*  CREATININE 1.19* 1.16* 1.04* 1.12* 1.37* 1.57* 1.60* 1.41* 1.46* 1.56*  -Continue monitoring  Radiculopathic pain in right leg.  No red flags.  She denies urine retention or bowel accidents.  Improved. -Consider gabapentin if pain worse. -Continue home tramadol  Chronic diarrhea: She has diarrhea for quite some time before recent antibiotic.  No fever or abdominal pain.  Abdominal exam benign.  Low suspicion for C. difficile. -Continue Imodium as needed  Essential hypertension:  Normotensive. -Now on p.o. Cardizem and IV Lasix  Recent COVID-19 infection:  Tested positive on 1/20.  Vaccinated but has not had a booster.  COVID-19 PCR negative this admission. -No indication for isolation  Hypothyroidism: TSH 1.77 on 11/22/2020 -Continue levothyroxine 125 mcg daily  Hyperlipidemia -Continue statin when able  Psoriatic arthritis -Continue leflunomide when  able -Now on steroid for COPD  GERD -Continue Protonix 40 mg daily  Goal of care: DNR, no ACLS meds, no DCCV but intubation and NIPPV  Debility/physical deconditioning -PT/OT eval.  They recommend 24-hour supervision however patient lives by herself.  Asked primary RN to call PT again to reassess her.  Morbid obesity Body mass index is 39.76 kg/m.  -Consider GLP-1 inhibitors if no contraindication       DVT prophylaxis:   apixaban (ELIQUIS) tablet 5 mg  Code Status: Partial code as above Family Communication: Updated patient's daughter at bedside today. Level of care: Progressive Status is: Inpatient  Remains inpatient appropriate because:Hemodynamically unstable, Unsafe d/c plan, IV treatments appropriate due to intensity of illness or inability to take PO and Inpatient level of care appropriate due to severity of illness   Dispo: The patient is from: Home              Anticipated d/c is to: Home              Patient currently is not medically stable to d/c.   Difficult to place patient No       Consultants:  Cardiology Pulmonology   Sch Meds:  Scheduled Meds: . apixaban  5 mg Oral BID  . arformoterol  15 mcg Nebulization BID  . atorvastatin  40 mg Oral Daily  . budesonide (PULMICORT) nebulizer  solution  0.5 mg Nebulization BID  . citalopram  40 mg Oral Daily  . cyanocobalamin  1,000 mcg Intramuscular Daily   Followed by  . [START ON 01/04/2021] cyanocobalamin  1,000 mcg Intramuscular Weekly   Followed by  . [START ON 02/01/2021] cyanocobalamin  1,000 mcg Intramuscular Q30 days  . diltiazem  240 mg Oral BID  . ipratropium  0.5 mg Nebulization TID  . leflunomide  20 mg Oral Daily  . levalbuterol  1.25 mg Nebulization TID  . levothyroxine  125 mcg Oral QAC breakfast  . methylPREDNISolone (SOLU-MEDROL) injection  40 mg Intravenous Q12H  . montelukast  10 mg Oral QHS  . pantoprazole  40 mg Oral QAC breakfast  . sodium chloride flush  3 mL Intravenous Q12H   . torsemide  20 mg Oral Daily   Continuous Infusions: . sodium chloride    . azithromycin 500 mg (12/30/20 1034)   PRN Meds:.sodium chloride, acetaminophen, cyclobenzaprine, guaiFENesin, loperamide, ondansetron (ZOFRAN) IV, sodium chloride flush, traMADol, triamcinolone  Antimicrobials: Anti-infectives (From admission, onward)   Start     Dose/Rate Route Frequency Ordered Stop   12/27/20 0915  cefTRIAXone (ROCEPHIN) 1 g in sodium chloride 0.9 % 100 mL IVPB  Status:  Discontinued        1 g 200 mL/hr over 30 Minutes Intravenous Every 24 hours 12/27/20 0903 12/28/20 1759   12/27/20 0915  azithromycin (ZITHROMAX) 500 mg in sodium chloride 0.9 % 250 mL IVPB        500 mg 250 mL/hr over 60 Minutes Intravenous Every 24 hours 12/27/20 0903         I have personally reviewed the following labs and images: CBC: Recent Labs  Lab 12/27/20 0136 12/27/20 0239 12/28/20 0327 12/28/20 1018 12/28/20 1753 12/29/20 0215 12/30/20 0151  WBC 10.6*  --  7.5  --   --  6.8  --   NEUTROABS 8.2*  --  5.5  --   --   --   --   HGB 8.7*   < > 7.8* 8.6* 10.0* 9.8* 10.1*  HCT 31.6*   < > 26.4* 29.0* 33.4* 31.8* 34.1*  MCV 99.1  --  95.3  --   --  93.5  --   PLT 213  --  174  --   --  169  --    < > = values in this interval not displayed.   BMP &GFR Recent Labs  Lab 12/27/20 0136 12/27/20 0239 12/27/20 0240 12/27/20 0338 12/27/20 0704 12/27/20 1244 12/28/20 0327 12/29/20 0215 12/30/20 0151  NA 139 138   < >  --  137 138 141 141 140  K 4.9 4.9   < >  --  5.0 4.6 4.7 3.9 4.5  CL 98 99  --   --   --   --  94* 94* 90*  CO2 32  --   --   --   --   --  38* 34* 36*  GLUCOSE 147* 141*  --   --   --   --  104* 194* 159*  BUN 34* 39*  --   --   --   --  31* 31* 36*  CREATININE 1.57* 1.60*  --   --   --   --  1.41* 1.46* 1.56*  CALCIUM 8.9  --   --   --   --   --  9.2 9.4 9.7  MG  --   --   --  2.0  --   --   --  1.8 2.1  PHOS  --   --   --   --   --   --   --  2.8 4.1   < > = values in this  interval not displayed.   Estimated Creatinine Clearance: 31.2 mL/min (A) (by C-G formula based on SCr of 1.56 mg/dL (H)). Liver & Pancreas: Recent Labs  Lab 12/27/20 0136 12/29/20 0215 12/30/20 0151  AST 25  --   --   ALT 24  --   --   ALKPHOS 55  --   --   BILITOT 0.4  --   --   PROT 6.2*  --   --   ALBUMIN 3.3* 3.1* 3.4*   No results for input(s): LIPASE, AMYLASE in the last 168 hours. No results for input(s): AMMONIA in the last 168 hours. Diabetic: No results for input(s): HGBA1C in the last 72 hours. No results for input(s): GLUCAP in the last 168 hours. Cardiac Enzymes: No results for input(s): CKTOTAL, CKMB, CKMBINDEX, TROPONINI in the last 168 hours. Recent Labs    01/18/20 1110  PROBNP 331*   Coagulation Profile: No results for input(s): INR, PROTIME in the last 168 hours. Thyroid Function Tests: Recent Labs    12/28/20 0327  TSH 0.752   Lipid Profile: No results for input(s): CHOL, HDL, LDLCALC, TRIG, CHOLHDL, LDLDIRECT in the last 72 hours. Anemia Panel: Recent Labs    12/28/20 0327  VITAMINB12 162*  FOLATE 9.9  FERRITIN 46  TIBC 349  IRON 25*  RETICCTPCT 1.8   Urine analysis:    Component Value Date/Time   COLORURINE YELLOW 05/31/2013 1412   APPEARANCEUR CLEAR 05/31/2013 1412   LABSPEC 1.027 05/31/2013 1412   PHURINE 5.0 05/31/2013 1412   GLUCOSEU NEGATIVE 05/31/2013 1412   HGBUR NEGATIVE 05/31/2013 1412   HGBUR negative 07/18/2010 0941   BILIRUBINUR SMALL (A) 05/31/2013 1412   BILIRUBINUR n 08/10/2012 1131   KETONESUR NEGATIVE 05/31/2013 1412   PROTEINUR 30 (A) 05/31/2013 1412   UROBILINOGEN 0.2 05/31/2013 1412   NITRITE NEGATIVE 05/31/2013 1412   LEUKOCYTESUR NEGATIVE 05/31/2013 1412   Sepsis Labs: Invalid input(s): PROCALCITONIN, LACTICIDVEN  Microbiology: Recent Results (from the past 240 hour(s))  SARS CORONAVIRUS 2 (TAT 6-24 HRS) Nasopharyngeal Nasopharyngeal Swab     Status: None   Collection Time: 12/27/20  4:10 AM    Specimen: Nasopharyngeal Swab  Result Value Ref Range Status   SARS Coronavirus 2 NEGATIVE NEGATIVE Final    Comment: (NOTE) SARS-CoV-2 target nucleic acids are NOT DETECTED.  The SARS-CoV-2 RNA is generally detectable in upper and lower respiratory specimens during the acute phase of infection. Negative results do not preclude SARS-CoV-2 infection, do not rule out co-infections with other pathogens, and should not be used as the sole basis for treatment or other patient management decisions. Negative results must be combined with clinical observations, patient history, and epidemiological information. The expected result is Negative.  Fact Sheet for Patients: HairSlick.no  Fact Sheet for Healthcare Providers: quierodirigir.com  This test is not yet approved or cleared by the [place] FDA and  has been authorized for detection and/or diagnosis of SARS-CoV-2 by FDA under an Emergency Use Authorization (EUA). This EUA will remain  in effect (meaning this test can be used) for the duration of the COVID-19 declaration under Se ction 564(b)(1) of the Act, 21 U.S.C. section 360bbb-3(b)(1), unless the authorization is terminated or revoked sooner.  Performed at [facility], [address], [place], [place] [address]  Expectorated Sputum Assessment w Gram Stain, Rflx to Resp Cult     Status: None   Collection Time: 12/28/20  2:49 PM   Specimen: Expectorated Sputum  Result Value Ref Range Status   Specimen Description EXPECTORATED SPUTUM  Final   Special Requests NONE  Final   Sputum evaluation   Final    THIS SPECIMEN IS ACCEPTABLE FOR SPUTUM CULTURE Performed at [facility], [address] [place], [place] [address]    Report Status 12/28/2020 FINAL  Final  Culture, Respiratory w Gram Stain     Status: None (Preliminary result)   Collection Time: 12/28/20  2:49 PM  Result Value Ref Range Status   Specimen  Description EXPECTORATED SPUTUM  Final   Special Requests NONE Reflexed from [id number]  Final   Gram Stain   Final    ABUNDANT WBC PRESENT,BOTH PMN AND MONONUCLEAR FEW GRAM POSITIVE COCCI    Culture   Final    RARE STAPHYLOCOCCUS AUREUS SUSCEPTIBILITIES TO FOLLOW Performed at [facility], [address], [place], [place] [address]    Report Status PENDING  Incomplete    Radiology Studies: No results found.  Hughie Closs, MD [facility]  If 7PM-7AM, please contact night-coverage [email or web address] 12/30/2020, 12:00 PM

## 2020-12-31 ENCOUNTER — Ambulatory Visit: Payer: Medicare Other | Admitting: Internal Medicine

## 2020-12-31 LAB — CBC WITH DIFFERENTIAL/PLATELET
Abs Immature Granulocytes: 0.1 10*3/uL — ABNORMAL HIGH (ref 0.00–0.07)
Basophils Absolute: 0 10*3/uL (ref 0.0–0.1)
Basophils Relative: 0 %
Eosinophils Absolute: 0 10*3/uL (ref 0.0–0.5)
Eosinophils Relative: 0 %
HCT: 31.3 % — ABNORMAL LOW (ref 36.0–46.0)
Hemoglobin: 9.7 g/dL — ABNORMAL LOW (ref 12.0–15.0)
Immature Granulocytes: 1 %
Lymphocytes Relative: 5 %
Lymphs Abs: 0.4 10*3/uL — ABNORMAL LOW (ref 0.7–4.0)
MCH: 29 pg (ref 26.0–34.0)
MCHC: 31 g/dL (ref 30.0–36.0)
MCV: 93.7 fL (ref 80.0–100.0)
Monocytes Absolute: 0.6 10*3/uL (ref 0.1–1.0)
Monocytes Relative: 6 %
Neutro Abs: 8.4 10*3/uL — ABNORMAL HIGH (ref 1.7–7.7)
Neutrophils Relative %: 88 %
Platelets: 182 10*3/uL (ref 150–400)
RBC: 3.34 MIL/uL — ABNORMAL LOW (ref 3.87–5.11)
RDW: 15.9 % — ABNORMAL HIGH (ref 11.5–15.5)
WBC: 9.5 10*3/uL (ref 4.0–10.5)
nRBC: 0 % (ref 0.0–0.2)

## 2020-12-31 LAB — BASIC METABOLIC PANEL
Anion gap: 12 (ref 5–15)
BUN: 45 mg/dL — ABNORMAL HIGH (ref 8–23)
CO2: 38 mmol/L — ABNORMAL HIGH (ref 22–32)
Calcium: 9.2 mg/dL (ref 8.9–10.3)
Chloride: 88 mmol/L — ABNORMAL LOW (ref 98–111)
Creatinine, Ser: 1.6 mg/dL — ABNORMAL HIGH (ref 0.44–1.00)
GFR, Estimated: 34 mL/min — ABNORMAL LOW (ref >60)
Glucose, Bld: 147 mg/dL — ABNORMAL HIGH (ref 70–99)
Potassium: 4.1 mmol/L (ref 3.5–5.1)
Sodium: 138 mmol/L (ref 135–145)

## 2020-12-31 LAB — CULTURE, RESPIRATORY W GRAM STAIN

## 2020-12-31 LAB — LEGIONELLA PNEUMOPHILA SEROGP 1 UR AG: L. pneumophila Serogp 1 Ur Ag: NEGATIVE

## 2020-12-31 MED ORDER — FERROUS SULFATE 325 (65 FE) MG PO TABS: 325.0000 mg | ORAL_TABLET | Freq: Every day | ORAL | 0 refills | Status: DC

## 2020-12-31 MED ORDER — TORSEMIDE 20 MG PO TABS: 20.0000 mg | ORAL_TABLET | Freq: Every day | ORAL | 0 refills | Status: DC

## 2020-12-31 NOTE — Discharge Summary (Signed)
Physician Discharge Summary  [name] ZOX:[id number] DOB: [birth year] DOA: 12/27/2020  PCP: [name], [name], MD  Admit date: 12/27/2020 Discharge date: 12/31/2020 30 Day Unplanned Readmission Risk Score   Flowsheet Row ED to Hosp-Admission (Current) from 12/27/2020 in [facility]  30 Day Unplanned Readmission Risk Score (%) 16.57 Filed at 12/31/2020 0801     This score is the patient's risk of an unplanned readmission within 30 days of being discharged (0 -100%). The score is based on dignosis, age, lab data, medications, orders, and past utilization.   Low:  0-14.9   Medium: 15-21.9   High: 22-29.9   Extreme: 30 and above         Admitted From: Home Disposition: Home  Recommendations for Outpatient Follow-up:  1. Follow up with PCP in 1-2 weeks 2. Follow-up with cardiology in 2 weeks 3. Follow-up with pulmonology in 1 week 4. Please arrange outpatient sleep study 5. Please obtain BMP/CBC in one week 6. Please follow up with your PCP on the following pending results: Unresulted Labs (From admission, onward)          Start     Ordered   12/28/20 1217  Legionella Pneumophila Serogp 1 Ur Ag  Once,   R        12/28/20 1216            Home Health: Yes Equipment/Devices: Home oxygen  Discharge Condition: Stable CODE STATUS: Partial Diet recommendation: Cardiac  Subjective: Seen and examined.  Daughter at the bedside.  Patient feels great without having any symptoms.  She is back to her baseline.  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 fib on Eliquis, CKD-3, psoriatic arthritis, sciatica, recent COVID-19 infection presented with progressive shortness of breath, near syncope and RLE radiculopathic pain, and admitted for acute on chronic hypoxic respiratory failure and near syncope in the setting of acute on chronic diastolic CHF, possible COPD  exacerbation and anemia.  She was a started on IV Lasix, BiPAP, antibiotics and nebulizers. Cardiology consulted on admission and evaluated patient.  PCCM consulted and added steroid.  She was subsequently weaned down and brought back to her 3 L of oxygen which is her baseline.  Subsequently her IV Lasix was transitioned to oral torsemide 20 mg p.o. daily by cardiology.  She was initially transitioned from long-acting to short-acting oral Cardizem but subsequently flipped back into sinus rhythm and cardiology switched her back to long-acting Cardizem to 40 mg p.o. daily.  She had mildly elevated troponins but they were flat suggestive of demand ischemia.  TEE did not show any wall motion abnormality.  She also had some anemia and her hemoglobin dropped to 7.8 for which she was transfused 1 unit of PRBC on 12/28/2020 along with 1 dose of IV for him.  Iron studies indicated iron deficiency anemia for which she is being prescribed oral iron at discharge.  Her radiculopathic pain improved.  She was assessed by PT OT and they recommended home health with intermittent supervision and her daughter has committed to provide their supervision and she is willing to take patient home today.  She has been cleared by cardiology.  Rest of her medical issues remained stable.  She will follow with cardiology, PCP and pulmonology as outpatient.  She had received enough of IV antibiotics and steroids for her COPD.  Currently she has no wheezing and she is  back to her baseline so no antibiotics or steroids are being prescribed.  Discharge Diagnoses:  Principal Problem:   Acute cardiogenic pulmonary edema (HCC) Active Problems:   Hypothyroidism   Essential hypertension   PULMONARY FIBROSIS ILD POST INFLAMMATORY CHRONIC   ACUTE ON CHRONIC DIASTOLIC HEART FAILURE   GERD (gastroesophageal reflux disease)   Morbid (severe) obesity due to excess calories (HCC)   S/P CABG x 3   Hyperlipidemia   COPD exacerbation (HCC)   Elevated  troponin   Atrial flutter (HCC)   Acute on chronic respiratory failure with hypoxia and hypercapnia (HCC)   History of COVID-19   Sciatica   Diarrhea    Discharge Instructions   Allergies as of 12/31/2020   No Known Allergies     Medication List    STOP taking these medications   furosemide 20 MG tablet Commonly known as: LASIX     TAKE these medications   acetaminophen 650 MG CR tablet Commonly known as: TYLENOL Take 650 mg by mouth 2 (two) times daily.   albuterol 108 (90 Base) MCG/ACT inhaler Commonly known as: VENTOLIN HFA Inhale 2 puffs into the lungs every 4 (four) hours as needed (shortness of breath, if you can't catch your breath).   albuterol (2.5 MG/3ML) 0.083% nebulizer solution Commonly known as: PROVENTIL Take 3 mLs (2.5 mg total) by nebulization every 6 (six) hours as needed for wheezing or shortness of breath.   amoxicillin 500 MG tablet Commonly known as: AMOXIL Take 1,000 mg by mouth as directed. Dental procedure   apixaban 5 MG Tabs tablet Commonly known as: Eliquis Take 1 tablet (5 mg total) by mouth 2 (two) times daily.   atorvastatin 40 MG tablet Commonly known as: LIPITOR TAKE 1 TABLET BY MOUTH  DAILY AT 6 PM. Please schedule appointment for future refills. Thank you What changed:   how much to take  how to take this  when to take this  additional instructions   budesonide 0.25 MG/2ML nebulizer solution Commonly known as: Pulmicort One twice daily What changed:   how much to take  how to take this  when to take this  additional instructions   CALCIUM PO Take 1 tablet by mouth daily.   citalopram 40 MG tablet Commonly known as: CELEXA TAKE 1 TABLET BY MOUTH IN  THE MORNING What changed: when to take this   cyclobenzaprine 5 MG tablet Commonly known as: FLEXERIL TAKE 1 TABLET BY MOUTH AT  BEDTIME AS NEEDED FOR  MUSCLE SPASM(S) What changed:   how much to take  how to take this  when to take this  reasons to  take this  additional instructions   diltiazem 240 MG 24 hr capsule Commonly known as: CARDIZEM CD Take 1 capsule (240 mg total) by mouth daily. What changed: Another medication with the same name was removed. Continue taking this medication, and follow the directions you see here.   formoterol 20 MCG/2ML nebulizer solution Commonly known as: PERFOROMIST Take 2 mLs (20 mcg total) by nebulization 2 (two) times daily. Use in nebulizer twice daily perfectly regularly   golimumab 50 MG/4ML Soln injection Commonly known as: SIMPONI ARIA Inject 50 mg into the vein every 8 (eight) weeks.   leflunomide 20 MG tablet Commonly known as: Arava Take 1 tablet (20 mg total) by mouth daily.   levothyroxine 125 MCG tablet Commonly known as: SYNTHROID TAKE 1 TABLET BY MOUTH EVERY DAY What changed: when to take this   losartan 50 MG tablet  Commonly known as: COZAAR TAKE 1 TABLET BY MOUTH  DAILY   montelukast 10 MG tablet Commonly known as: SINGULAIR TAKE 1 TABLET BY MOUTH AT  BEDTIME   OXYGEN Inhale 3 L into the lungs continuous. continuous o2   pantoprazole 40 MG tablet Commonly known as: PROTONIX TAKE 1 TABLET BY MOUTH  DAILY BEFORE BREAKFAST   Spiriva Respimat 2.5 MCG/ACT Aers Generic drug: Tiotropium Bromide Monohydrate USE 2 INHALATIONS BY MOUTH  EACH MORNING What changed:   how much to take  how to take this  when to take this  additional instructions   torsemide 20 MG tablet Commonly known as: DEMADEX Take 1 tablet (20 mg total) by mouth daily. Start taking on: January 01, 2021   traMADol 50 MG tablet Commonly known as: ULTRAM Take 1 tablet (50 mg total) by mouth 2 (two) times daily.   triamcinolone 0.1 % Commonly known as: KENALOG Apply 1 application topically 2 (two) times daily as needed (for psoriasis).   Vitamin D3 50 MCG (2000 UT) Tabs Take 2,000 Int'l Units by mouth daily.       Follow-up Information    [name], [name], MD Follow up in 1  week(s).   Specialty: Internal Medicine Contact information: [address] [place] [address] [phone]        Lars Masson, MD .   Specialty: Cardiology Contact information: [address] [place] [address] [phone]              No Known Allergies  Consultations: Cardiology and PCCM   Procedures/Studies: CT HEAD WO CONTRAST  Result Date: 12/27/2020 CLINICAL DATA:  Altered mental status. EXAM: CT HEAD WITHOUT CONTRAST TECHNIQUE: Contiguous axial images were obtained from the base of the skull through the vertex without intravenous contrast. COMPARISON:  November 05, 2018. FINDINGS: Brain: Mild chronic ischemic white matter disease is noted. No mass effect or midline shift is noted. Ventricular size is within normal limits. There is no evidence of mass lesion, hemorrhage or acute infarction. Vascular: No hyperdense vessel or unexpected calcification. Skull: Normal. Negative for fracture or focal lesion. Sinuses/Orbits: No acute finding. Other: Fluid is noted in the right mastoid air cells. IMPRESSION: Mild chronic ischemic white matter disease. No acute intracranial abnormality seen. Electronically Signed   By: Lupita Raider M.D.   On: 12/27/2020 16:58   CT Angio Chest PE W and/or Wo Contrast  Result Date: 12/27/2020 CLINICAL DATA:  75 year old female with increased shortness of breath recently diagnosed with atrial fibrillation. EXAM: CT ANGIOGRAPHY CHEST WITH CONTRAST TECHNIQUE: Multidetector CT imaging of the chest was performed using the standard protocol during bolus administration of intravenous contrast. Multiplanar CT image reconstructions and MIPs were obtained to evaluate the vascular anatomy. CONTRAST:  80mL OMNIPAQUE IOHEXOL 350 MG/ML SOLN COMPARISON:  Chest radiographs 0135 hours today and earlier. CTA 03/10/2019. FINDINGS: Cardiovascular: Adequate contrast bolus timing in the pulmonary arterial tree. Respiratory motion  artifact a especially in the upper lobes. Subsequently, upper lobe pulmonary artery branches are not evaluated beyond the hila. There is no central or hilar pulmonary artery filling defect. Middle lobe and lower lobe pulmonary artery branch detail is better preserved, with no filling defect identified in those branches. Prior CABG. Calcified aortic atherosclerosis. Cardiac size has mildly increased since 2020, mild cardiomegaly. No pericardial effusion. Little contrast in the aorta on this exam. Mediastinum/Nodes: Negative.  No lymphadenopathy. Lungs/Pleura: Small layering right pleural effusion. Small left pleural effusion only at the costophrenic  angle. Enhancing left lower lobe atelectasis. Confluent but enhancing airspace opacity in the right lower lobe more resembles atelectasis than lower lobe pneumonia. Atelectatic changes to the major airways which remain patent. There is bilateral upper lobe perihilar and peribronchial ground-glass opacity. Superimposed chronic upper lobe and middle lobe subpleural scarring greater on the right. Upper Abdomen: Absent gallbladder. There is only trace contrast reflux into the hepatic IVC and hepatic veins. Otherwise negative visible liver, spleen, pancreas, adrenal glands, right renal upper pole. Small gastric hiatal hernia redemonstrated. Musculoskeletal: Previous sternotomy. Diffuse cervical spine disc and endplate degeneration with widespread vacuum disc. Mild chronic T4 superior endplate compression fracture is stable. Chronic T7-T8 thoracic spinal stenosis related to bulky disc osteophyte complex. No acute osseous abnormality identified. Review of the MIP images confirms the above findings. IMPRESSION: 1. No pulmonary embolus identified. Upper lobe pulmonary artery branches are obscured by motion. 2. Acute pulmonary edema suspected. Small right greater than left pleural effusions. Confluent but enhancing right lower lobe airspace disease is more compatible with severe  atelectasis than Pneumonia. Superimposed chronic lung scarring. 3. Mild cardiomegaly. Prior CABG. Aortic Atherosclerosis (ICD10-I70.0). 4. Chronic T4 compression fracture, T7-T8 thoracic spinal stenosis. Electronically Signed   By: Odessa Fleming M.D.   On: 12/27/2020 04:59   DG CHEST PORT 1 VIEW  Result Date: 12/29/2020 CLINICAL DATA:  Shortness of breath. EXAM: PORTABLE CHEST 1 VIEW COMPARISON:  12/27/2020 FINDINGS: Sternotomy wires unchanged. Lungs are somewhat hypoinflated as lordotic technique is demonstrated. Elevation the right hemidiaphragm. Opacification over the right base with blunting of the costophrenic angle likely due to effusion with associated atelectasis although infection is possible. Mild hazy prominence of the central pulmonary vessels likely mild vascular congestion. Stable cardiomegaly. Remainder of the exam is unchanged. IMPRESSION: 1. Right base opacification likely due to effusion with associated atelectasis, although infection is possible. 2. Stable cardiomegaly with suggestion of mild vascular congestion. Electronically Signed   By: Elberta Fortis M.D.   On: 12/29/2020 10:26   DG Chest Port 1 View  Result Date: 12/27/2020 CLINICAL DATA:  Worsening shortness of breath. EXAM: PORTABLE CHEST 1 VIEW COMPARISON:  November 22, 2020 FINDINGS: Multiple sternal wires and vascular clips are seen. Moderate to marked severity diffusely increased interstitial lung markings are present. Mild areas of atelectasis and/or infiltrate are also seen within the bilateral lung bases. There is mild, stable elevation of the right hemidiaphragm. Small bilateral pleural effusions are suspected. No pneumothorax is seen. There is moderate to marked severity enlargement of the cardiac silhouette. The visualized skeletal structures are unremarkable. IMPRESSION: 1. Moderate to marked severity interstitial edema with mild bibasilar atelectasis and/or infiltrate. 2. Small bilateral pleural effusions. Electronically Signed    By: Aram Candela M.D.   On: 12/27/2020 01:55   ECHOCARDIOGRAM COMPLETE  Result Date: 12/27/2020    ECHOCARDIOGRAM REPORT   Patient Name:   JURLINE FOLGER Date of Exam: 12/27/2020 Medical Rec #:  [id number]      Height:       59.0 in Accession #:    [id number]     Weight:       202.6 lb Date of Birth:  1946-02-07       BSA:          1.855 m Patient Age:    74 years       BP:           101/46 mmHg Patient Gender: F  HR:           106 bpm. Exam Location:  Inpatient Procedure: 2D Echo, Cardiac Doppler, Color Doppler and Intracardiac            Opacification Agent Indications:    Atrial fibillation                 CHF-Acute Diastolic  History:        Patient has prior history of Echocardiogram examinations, most                 recent 08/26/2018. CHF, CAD and Previous Myocardial Infarction,                 Prior CABG, COPD; Risk Factors:Hypertension and Dyslipidemia.  Sonographer:    Ross Ludwig RDCS (AE) Referring Phys: [id number] RONDELL A SMITH IMPRESSIONS  1. Left ventricular ejection fraction, by estimation, is 60 to 65%. The left ventricle has normal function. The left ventricle has no regional wall motion abnormalities. There is severe left ventricular hypertrophy. Left ventricular diastolic parameters  are indeterminate.  2. Right ventricular systolic function is normal. The right ventricular size is normal.  3. Left atrial size was moderately dilated.  4. The mitral valve is normal in structure. No evidence of mitral valve regurgitation. No evidence of mitral stenosis. Moderate mitral annular calcification.  5. The aortic valve is normal in structure. Aortic valve regurgitation is not visualized. No aortic stenosis is present.  6. The inferior vena cava is dilated in size with <50% respiratory variability, suggesting right atrial pressure of 15 mmHg. FINDINGS  Left Ventricle: Left ventricular ejection fraction, by estimation, is 60 to 65%. The left ventricle has normal function. The left  ventricle has no regional wall motion abnormalities. Definity contrast agent was given IV to delineate the left ventricular  endocardial borders. The left ventricular internal cavity size was normal in size. There is severe left ventricular hypertrophy. Left ventricular diastolic parameters are indeterminate. Right Ventricle: The right ventricular size is normal. No increase in right ventricular wall thickness. Right ventricular systolic function is normal. Left Atrium: Left atrial size was moderately dilated. Right Atrium: Right atrial size was normal in size. Pericardium: There is no evidence of pericardial effusion. Mitral Valve: The mitral valve is normal in structure. There is mild thickening of the mitral valve leaflet(s). There is moderate calcification of the mitral valve leaflet(s). Moderate mitral annular calcification. No evidence of mitral valve regurgitation. No evidence of mitral valve stenosis. Tricuspid Valve: The tricuspid valve is normal in structure. Tricuspid valve regurgitation is not demonstrated. No evidence of tricuspid stenosis. Aortic Valve: The aortic valve is normal in structure. Aortic valve regurgitation is not visualized. No aortic stenosis is present. Aortic valve mean gradient measures 4.0 mmHg. Aortic valve peak gradient measures 5.9 mmHg. Aortic valve area, by VTI measures 3.66 cm. Pulmonic Valve: The pulmonic valve was normal in structure. Pulmonic valve regurgitation is not visualized. No evidence of pulmonic stenosis. Aorta: The aortic root is normal in size and structure. Venous: The inferior vena cava is dilated in size with less than 50% respiratory variability, suggesting right atrial pressure of 15 mmHg. IAS/Shunts: No atrial level shunt detected by color flow Doppler.  LEFT VENTRICLE PLAX 2D LVIDd:         3.40 cm LVIDs:         2.80 cm LV PW:         1.90 cm LV IVS:        1.60 cm LVOT  diam:     2.70 cm LV SV:         75 LV SV Index:   40 LVOT Area:     5.73 cm  RIGHT  VENTRICLE          IVC RV Basal diam:  2.50 cm  IVC diam: 2.30 cm LEFT ATRIUM           Index       RIGHT ATRIUM           Index LA diam:      4.60 cm 2.48 cm/m  RA Area:     11.70 cm LA Vol (A2C): 27.9 ml 15.04 ml/m RA Volume:   23.20 ml  12.51 ml/m LA Vol (A4C): 92.1 ml 49.66 ml/m  AORTIC VALVE AV Area (Vmax):    3.54 cm AV Area (Vmean):   2.88 cm AV Area (VTI):     3.66 cm AV Vmax:           121.00 cm/s AV Vmean:          94.800 cm/s AV VTI:            0.205 m AV Peak Grad:      5.9 mmHg AV Mean Grad:      4.0 mmHg LVOT Vmax:         74.80 cm/s LVOT Vmean:        47.700 cm/s LVOT VTI:          0.131 m LVOT/AV VTI ratio: 0.64  AORTA Ao Root diam: 3.20 cm Ao Asc diam:  3.50 cm  SHUNTS Systemic VTI:  0.13 m Systemic Diam: 2.70 cm Donato Schultz MD Electronically signed by Donato Schultz MD Signature Date/Time: 12/27/2020/3:24:57 PM    Final    LONG TERM MONITOR (3-14 DAYS)  Result Date: 12/24/2020  Patient had a minimum heart rate of 65 bpm, maximum heart rate of 214 bpm, and average heart rate of 104 bpm.  Predominant underlying rhythm was sinus rhythm with 1st heart block.  One run of non-sustained ventricular tachycardia occurred lasting 6 beats at longest with a max rate of 214 bpm at fastest.  Isolated PACs were occasional (3.6%), with rare couplets and triplets present.  Isolated PVCs were rare (<1.0%), with rare couplets and trigeminy present.  Atrial Tachycardia vs Atrial Flutter occurred (37% burden), ranging from 65-198 bpm (avg of 133 bpm), the longest lasting 2 days 16 hours with an avg rate of 135 bpm.  Present at activation and de-activation of device.  Triggered and diary events associated with sinus rhythm, atrial tachycardia, atrial flutter, and PVCs.  Likely both atrial tachycardia and atrial flutter is present.   VAS [place] LOWER EXTREMITY VENOUS (DVT)  Result Date: 12/29/2020  Lower Venous DVT Study Indications: Edema.  Comparison Study: no prior Performing Technologist: Blanch Media  RVS  Examination Guidelines: A complete evaluation includes B-mode imaging, spectral Doppler, color Doppler, and power Doppler as needed of all accessible portions of each vessel. Bilateral testing is considered an integral part of a complete examination. Limited examinations for reoccurring indications may be performed as noted. The reflux portion of the exam is performed with the patient in reverse Trendelenburg.  +---------+---------------+---------+-----------+----------+--------------+ RIGHT    CompressibilityPhasicitySpontaneityPropertiesThrombus Aging +---------+---------------+---------+-----------+----------+--------------+ CFV      Full           Yes      Yes                                 +---------+---------------+---------+-----------+----------+--------------+  SFJ      Full                                                        +---------+---------------+---------+-----------+----------+--------------+ FV Prox  Full                                                        +---------+---------------+---------+-----------+----------+--------------+ FV Mid   Full                                                        +---------+---------------+---------+-----------+----------+--------------+ FV Distal               Yes      Yes                                 +---------+---------------+---------+-----------+----------+--------------+ PFV      Full                                                        +---------+---------------+---------+-----------+----------+--------------+ POP      Full           Yes      Yes                                 +---------+---------------+---------+-----------+----------+--------------+ PTV      Full                                                        +---------+---------------+---------+-----------+----------+--------------+ PERO     Full                                                         +---------+---------------+---------+-----------+----------+--------------+   +---------+---------------+---------+-----------+----------+--------------+ LEFT     CompressibilityPhasicitySpontaneityPropertiesThrombus Aging +---------+---------------+---------+-----------+----------+--------------+ CFV      Full           Yes      Yes                                 +---------+---------------+---------+-----------+----------+--------------+ SFJ      Full                                                        +---------+---------------+---------+-----------+----------+--------------+  FV Prox  Full                                                        +---------+---------------+---------+-----------+----------+--------------+ FV Mid   Full                                                        +---------+---------------+---------+-----------+----------+--------------+ FV DistalFull                                                        +---------+---------------+---------+-----------+----------+--------------+ PFV      Full                                                        +---------+---------------+---------+-----------+----------+--------------+ POP      Full           Yes      Yes                                 +---------+---------------+---------+-----------+----------+--------------+ PTV      Full                                                        +---------+---------------+---------+-----------+----------+--------------+ PERO     Full                                                        +---------+---------------+---------+-----------+----------+--------------+     Summary: BILATERAL: - No evidence of deep vein thrombosis seen in the lower extremities, bilaterally. - No evidence of superficial venous thrombosis in the lower extremities, bilaterally. -No evidence of popliteal cyst, bilaterally.   *See table(s) above for measurements  and observations. Electronically signed by Fabienne Bruns MD on 12/29/2020 at 10:41:22 AM.    Final       Discharge Exam: Vitals:   12/30/20 2310 12/31/20 0309  BP: 131/79 135/80  Pulse: 81 74  Resp: 20 20  Temp: 97.8 F (36.6 C) (!) 97.5 F (36.4 C)  SpO2: 95% 96%   Vitals:   12/30/20 2153 12/30/20 2310 12/31/20 0309 12/31/20 0611  BP:  131/79 135/80   Pulse: 87 81 74   Resp: Temp:  97.8 F (36.6 C) (!) 97.5 F (36.4 C)   TempSrc:      SpO2: 97% 95% 96%   Weight:    89.3 kg  Height:  General: Pt is alert, awake, not in acute distress Cardiovascular: RRR, S1/S2 +, no rubs, no gallops Respiratory: Diminished breath sounds bilaterally, no wheezing, no rhonchi Abdominal: Soft, NT, ND, bowel sounds + Extremities: no edema, no cyanosis    The results of significant diagnostics from this hospitalization (including imaging, microbiology, ancillary and laboratory) are listed below for reference.     Microbiology: Recent Results (from the past 240 hour(s))  SARS CORONAVIRUS 2 (TAT 6-24 HRS) Nasopharyngeal Nasopharyngeal Swab     Status: None   Collection Time: 12/27/20  4:10 AM   Specimen: Nasopharyngeal Swab  Result Value Ref Range Status   SARS Coronavirus 2 NEGATIVE NEGATIVE Final    Comment: (NOTE) SARS-CoV-2 target nucleic acids are NOT DETECTED.  The SARS-CoV-2 RNA is generally detectable in upper and lower respiratory specimens during the acute phase of infection. Negative results do not preclude SARS-CoV-2 infection, do not rule out co-infections with other pathogens, and should not be used as the sole basis for treatment or other patient management decisions. Negative results must be combined with clinical observations, patient history, and epidemiological information. The expected result is Negative.  Fact Sheet for Patients: HairSlick.no  Fact Sheet for Healthcare  Providers: quierodirigir.com  This test is not yet approved or cleared by the [place] FDA and  has been authorized for detection and/or diagnosis of SARS-CoV-2 by FDA under an Emergency Use Authorization (EUA). This EUA will remain  in effect (meaning this test can be used) for the duration of the COVID-19 declaration under Se ction 564(b)(1) of the Act, 21 U.S.C. section 360bbb-3(b)(1), unless the authorization is terminated or revoked sooner.  Performed at [facility], [address] [place], [place] [address]   Expectorated Sputum Assessment w Gram Stain, Rflx to Resp Cult     Status: None   Collection Time: 12/28/20  2:49 PM   Specimen: Expectorated Sputum  Result Value Ref Range Status   Specimen Description EXPECTORATED SPUTUM  Final   Special Requests NONE  Final   Sputum evaluation   Final    THIS SPECIMEN IS ACCEPTABLE FOR SPUTUM CULTURE Performed at [facility], [address], [place], [place] [address]    Report Status 12/28/2020 FINAL  Final  Culture, Respiratory w Gram Stain     Status: None   Collection Time: 12/28/20  2:49 PM  Result Value Ref Range Status   Specimen Description EXPECTORATED SPUTUM  Final   Special Requests NONE Reflexed from [id number]  Final   Gram Stain   Final    ABUNDANT WBC PRESENT,BOTH PMN AND MONONUCLEAR FEW GRAM POSITIVE COCCI Performed at [facility], [address], [place], [place] [address]    Culture RARE METHICILLIN RESISTANT STAPHYLOCOCCUS AUREUS  Final   Report Status 12/31/2020 FINAL  Final   Organism ID, Bacteria METHICILLIN RESISTANT STAPHYLOCOCCUS AUREUS  Final      Susceptibility   Methicillin resistant staphylococcus aureus - MIC*    CIPROFLOXACIN >=8 RESISTANT Resistant     ERYTHROMYCIN >=8 RESISTANT Resistant     GENTAMICIN <=0.5 SENSITIVE Sensitive     OXACILLIN >=4 RESISTANT Resistant     TETRACYCLINE >=16 RESISTANT Resistant     VANCOMYCIN <=0.5 SENSITIVE Sensitive      TRIMETH/SULFA <=10 SENSITIVE Sensitive     CLINDAMYCIN >=8 RESISTANT Resistant     RIFAMPIN <=0.5 SENSITIVE Sensitive     Inducible Clindamycin NEGATIVE Sensitive     * RARE METHICILLIN RESISTANT STAPHYLOCOCCUS AUREUS  Labs: BNP (last 3 results) Recent Labs    06/07/20 0600 12/27/20 0136  BNP 152.2* 329.1*   Basic Metabolic Panel: Recent Labs  Lab 12/27/20 0136 12/27/20 0239 12/27/20 0240 12/27/20 0338 12/27/20 0704 12/27/20 1244 12/28/20 0327 12/29/20 0215 12/30/20 0151 12/31/20 0352  NA 139 138   < >  --    < > 138 141 141 140 138  K 4.9 4.9   < >  --    < > 4.6 4.7 3.9 4.5 4.1  CL 98 99  --   --   --   --  94* 94* 90* 88*  CO2 32  --   --   --   --   --  38* 34* 36* 38*  GLUCOSE 147* 141*  --   --   --   --  104* 194* 159* 147*  BUN 34* 39*  --   --   --   --  31* 31* 36* 45*  CREATININE 1.57* 1.60*  --   --   --   --  1.41* 1.46* 1.56* 1.60*  CALCIUM 8.9  --   --   --   --   --  9.2 9.4 9.7 9.2  MG  --   --   --  2.0  --   --   --  1.8 2.1  --   PHOS  --   --   --   --   --   --   --  2.8 4.1  --    < > = values in this interval not displayed.   Liver Function Tests: Recent Labs  Lab 12/27/20 0136 12/29/20 0215 12/30/20 0151  AST 25  --   --   ALT 24  --   --   ALKPHOS 55  --   --   BILITOT 0.4  --   --   PROT 6.2*  --   --   ALBUMIN 3.3* 3.1* 3.4*   No results for input(s): LIPASE, AMYLASE in the last 168 hours. No results for input(s): AMMONIA in the last 168 hours. CBC: Recent Labs  Lab 12/27/20 0136 12/27/20 0239 12/28/20 0327 12/28/20 1018 12/28/20 1753 12/29/20 0215 12/30/20 0151 12/31/20 0352  WBC 10.6*  --  7.5  --   --  6.8  --  9.5  NEUTROABS 8.2*  --  5.5  --   --   --   --  8.4*  HGB 8.7*   < > 7.8* 8.6* 10.0* 9.8* 10.1* 9.7*  HCT 31.6*   < > 26.4* 29.0* 33.4* 31.8* 34.1* 31.3*  MCV 99.1  --  95.3  --   --  93.5  --  93.7  PLT 213  --  174  --   --  169  --  182   < > = values in this interval not displayed.   Cardiac  Enzymes: No results for input(s): CKTOTAL, CKMB, CKMBINDEX, TROPONINI in the last 168 hours. BNP: Invalid input(s): POCBNP CBG: No results for input(s): GLUCAP in the last 168 hours. D-Dimer Recent Labs    12/28/20 1238  DDIMER 0.79*   Hgb A1c No results for input(s): HGBA1C in the last 72 hours. Lipid Profile No results for input(s): CHOL, HDL, LDLCALC, TRIG, CHOLHDL, LDLDIRECT in the last 72 hours. Thyroid function studies No results for input(s): TSH, T4TOTAL, T3FREE, THYROIDAB in the last 72 hours.  Invalid input(s): FREET3 Anemia work up No results for input(s): VITAMINB12, FOLATE,  FERRITIN, TIBC, IRON, RETICCTPCT in the last 72 hours. Urinalysis    Component Value Date/Time   COLORURINE YELLOW 05/31/2013 1412   APPEARANCEUR CLEAR 05/31/2013 1412   LABSPEC 1.027 05/31/2013 1412   PHURINE 5.0 05/31/2013 1412   GLUCOSEU NEGATIVE 05/31/2013 1412   HGBUR NEGATIVE 05/31/2013 1412   HGBUR negative 07/18/2010 0941   BILIRUBINUR SMALL (A) 05/31/2013 1412   BILIRUBINUR n 08/10/2012 1131   KETONESUR NEGATIVE 05/31/2013 1412   PROTEINUR 30 (A) 05/31/2013 1412   UROBILINOGEN 0.2 05/31/2013 1412   NITRITE NEGATIVE 05/31/2013 1412   LEUKOCYTESUR NEGATIVE 05/31/2013 1412   Sepsis Labs Invalid input(s): PROCALCITONIN,  WBC,  LACTICIDVEN Microbiology Recent Results (from the past 240 hour(s))  SARS CORONAVIRUS 2 (TAT 6-24 HRS) Nasopharyngeal Nasopharyngeal Swab     Status: None   Collection Time: 12/27/20  4:10 AM   Specimen: Nasopharyngeal Swab  Result Value Ref Range Status   SARS Coronavirus 2 NEGATIVE NEGATIVE Final    Comment: (NOTE) SARS-CoV-2 target nucleic acids are NOT DETECTED.  The SARS-CoV-2 RNA is generally detectable in upper and lower respiratory specimens during the acute phase of infection. Negative results do not preclude SARS-CoV-2 infection, do not rule out co-infections with other pathogens, and should not be used as the sole basis for treatment or  other patient management decisions. Negative results must be combined with clinical observations, patient history, and epidemiological information. The expected result is Negative.  Fact Sheet for Patients: HairSlick.no  Fact Sheet for Healthcare Providers: quierodirigir.com  This test is not yet approved or cleared by the [place] FDA and  has been authorized for detection and/or diagnosis of SARS-CoV-2 by FDA under an Emergency Use Authorization (EUA). This EUA will remain  in effect (meaning this test can be used) for the duration of the COVID-19 declaration under Se ction 564(b)(1) of the Act, 21 U.S.C. section 360bbb-3(b)(1), unless the authorization is terminated or revoked sooner.  Performed at [facility], [address] [place], [place] [address]   Expectorated Sputum Assessment w Gram Stain, Rflx to Resp Cult     Status: None   Collection Time: 12/28/20  2:49 PM   Specimen: Expectorated Sputum  Result Value Ref Range Status   Specimen Description EXPECTORATED SPUTUM  Final   Special Requests NONE  Final   Sputum evaluation   Final    THIS SPECIMEN IS ACCEPTABLE FOR SPUTUM CULTURE Performed at [facility], [address], [place], [place] [address]    Report Status 12/28/2020 FINAL  Final  Culture, Respiratory w Gram Stain     Status: None   Collection Time: 12/28/20  2:49 PM  Result Value Ref Range Status   Specimen Description EXPECTORATED SPUTUM  Final   Special Requests NONE Reflexed from [id number]  Final   Gram Stain   Final    ABUNDANT WBC PRESENT,BOTH PMN AND MONONUCLEAR FEW GRAM POSITIVE COCCI Performed at [facility], [address], [place], [place] [address]    Culture RARE METHICILLIN RESISTANT STAPHYLOCOCCUS AUREUS  Final   Report Status 12/31/2020 FINAL  Final   Organism ID, Bacteria METHICILLIN RESISTANT STAPHYLOCOCCUS AUREUS  Final      Susceptibility   Methicillin  resistant staphylococcus aureus - MIC*    CIPROFLOXACIN >=8 RESISTANT Resistant     ERYTHROMYCIN >=8 RESISTANT Resistant     GENTAMICIN <=0.5 SENSITIVE Sensitive     OXACILLIN >=4 RESISTANT Resistant     TETRACYCLINE >=16 RESISTANT Resistant  VANCOMYCIN <=0.5 SENSITIVE Sensitive     TRIMETH/SULFA <=10 SENSITIVE Sensitive     CLINDAMYCIN >=8 RESISTANT Resistant     RIFAMPIN <=0.5 SENSITIVE Sensitive     Inducible Clindamycin NEGATIVE Sensitive     * RARE METHICILLIN RESISTANT STAPHYLOCOCCUS AUREUS     Time coordinating discharge: Over 30 minutes  SIGNED:   Hughie Closs, MD  [facility] 12/31/2020, 11:21 AM  If 7PM-7AM, please contact night-coverage [email or web address]

## 2020-12-31 NOTE — TOC Transition Note (Signed)
Transition of Care [facility]) - CM/SW Discharge Note   Patient Details  Name: [name] MRN: [id number] Date of Birth: [birth year]  [facility] CM/SW Contact:  Beckie Busing, RN Phone Number: [phone]  12/31/2020, 1:01 PM   Clinical Narrative:    CM at bedside to speak with patient and daughter. Patient is agreeable to home health. HH has been set up with [facility]. No other needs noted at this time. TOC will sign off.    Final next level of care: Home w Home Health Services Barriers to Discharge: No Barriers Identified   Patient Goals and CMS Choice Patient states their goals for this hospitalization and ongoing recovery are:: Patient states she is ready to go home. CMS Medicare.gov Compare Post Acute Care list provided to:: Patient Choice offered to / list presented to : Patient  Discharge Placement                       Discharge Plan and Services In-house Referral: NA Discharge Planning Services: CM Consult Post Acute Care Choice: Home Health          DME Arranged: N/A DME Agency: NA       HH Arranged: RN,PT,OT HH Agency: [facility] (now [facility]) Date HH Agency Contacted: 12/31/20 Time HH Agency Contacted: [phone] Representative spoke with at [facility] Agency: [place]  Social Determinants of Health (SDOH) Interventions     Readmission Risk Interventions No flowsheet data found.

## 2020-12-31 NOTE — Discharge Instructions (Signed)

## 2020-12-31 NOTE — Progress Notes (Signed)
OT Cancellation Note  Patient Details Name: Latonya Nelon MRN: [id number] DOB: April 08, 1946   Cancelled Treatment:    Reason Eval/Treat Not Completed: Patient declined, no reason specified (Pt with discharge orders today and pt did well with PT this AM. Pt and daughter concluding that pt will be alright with family and they are hiring assist at home. OT to follow-up as needed.)   Flora Lipps, OTR/L Acute Rehabilitation Services Pager: [phone] Office: [phone]   Lonzo Cloud 12/31/2020, 1:33 PM

## 2020-12-31 NOTE — Progress Notes (Signed)
Physical Therapy Treatment Patient Details Name: [name] MRN: [id number] DOB: [birth year] Today's Date: 12/31/2020    History of Present Illness 75 y.o. female with medical history significant of HTN, HLD, CAD s/p NSTEMI with 3v CABG, atrial flutter/atrial fibrillation, ILD, chronic respiratory failure on home O2 of 3L, hypothyroidism, psoriatic arthritis, and chronic kidney disease stage III presenting with worsening shortness of breath. Pt admitted for acute cardiogenic pulmonary edema. Initially required bipap but transitioned to 3L via [place] on day 2 of admission, Bipap for sleeping.    PT Comments    Daughter present and observed how much better patient did today. Dyspnea improved and pt able to tolerate ambulating 60 ft twice with standing rest between and sats >91% on 3L O2. Daughter can provide the intermittent supervision patient requires (especially for morning routine and evening routine) and is in the process of hiring an aide for 12 hrs/day. All in agreement that patient is safe to go home with this plan in place. MD notified.     Follow Up Recommendations  Home health PT;Supervision - Intermittent     Equipment Recommendations  None recommended by PT    Recommendations for Other Services       Precautions / Restrictions Precautions Precautions: Fall Precaution Comments: watch O2, pt tends to hold her breath    Mobility  Bed Mobility                    Transfers Overall transfer level: Needs assistance Equipment used: Rolling walker (2 wheeled) Transfers: Sit to/from Stand Sit to Stand: Modified independent (Device/Increase time)         General transfer comment: no cues needed  Ambulation/Gait Ambulation/Gait assistance: Min guard Gait Distance (Feet): 60 Feet (standing rest, 60 ft) Assistive device: Rolling walker (2 wheeled) Gait Pattern/deviations: Step-through pattern;Wide base of support;Decreased stride length;Trunk flexed Gait  velocity: decreased   General Gait Details: pt denied dizziness but did have mild UE/ LE tremors Remained on 3L O2 with sats in 90's   Stairs             Wheelchair Mobility    Modified Rankin (Stroke Patients Only)       Balance Overall balance assessment: Needs assistance Sitting-balance support: No upper extremity supported;Feet supported Sitting balance-[name]: Good     Standing balance support: During functional activity;No upper extremity supported Standing balance-[name]: Fair Standing balance comment: able to stand without holding RW to pull up mesh underpants                            Cognition Arousal/Alertness: Awake/alert Behavior During Therapy: WFL for tasks assessed/performed Overall Cognitive Status: Within Functional Limits for tasks assessed                                 General Comments: Pleasant, quick-witted but anxious (baseline)      Exercises      General Comments General comments (skin integrity, edema, etc.): Daughter present and trying to hire an aide for 12 hrs/day (to cover pt getting up in a.m. and going to bed in pm.) In the meantime, daughter plans to cover this with family. Both pt and daughter feel good about this plan. Patient agrees to use [facility] for her multiple trips to bathroom during the night.      Pertinent Vitals/Pain Pain Assessment: Faces Faces Pain Scale:  Hurts little more Pain Location: HA, RLE neuropathic pain Pain Descriptors / Indicators: Discomfort;Headache Pain Intervention(s): Limited activity within patient's tolerance;Monitored during session;Patient requesting pain meds-RN notified    Home Living                      Prior Function            PT Goals (current goals can now be found in the care plan section) Acute Rehab PT Goals Patient Stated Goal: be able to go home when ready PT Goal Formulation: With patient/family Time For Goal Achievement:  01/11/21 Potential to Achieve Goals: Good Progress towards PT goals: Progressing toward goals    Frequency    Min 3X/week      PT Plan Discharge plan needs to be updated    Co-evaluation              AM-PAC PT "6 Clicks" Mobility   Outcome Measure  Help needed turning from your back to your side while in a flat bed without using bedrails?: None Help needed moving from lying on your back to sitting on the side of a flat bed without using bedrails?: A Little Help needed moving to and from a bed to a chair (including a wheelchair)?: None Help needed standing up from a chair using your arms (e.g., wheelchair or bedside chair)?: None Help needed to walk in hospital room?: A Little Help needed climbing 3-5 steps with a railing? : A Lot 6 Click Score: 20    End of Session Equipment Utilized During Treatment: Oxygen Activity Tolerance: Patient limited by pain (chronic RLE neuropathic pain) Patient left: with call bell/phone within reach;with family/visitor present;in bed Nurse Communication: Mobility status (changing PT rec to intermittent supervision/assist) PT Visit Diagnosis: Muscle weakness (generalized) (M62.81);Difficulty in walking, not elsewhere classified (R26.2);Pain Pain - Right/Left: Right Pain - part of body: Leg     Time: 1035-1100 PT Time Calculation (min) (ACUTE ONLY): 25 min  Charges:  $Gait Training: 8-22 mins $Self Care/Home Management: 8-22                      Jerolyn Center, PT Pager [phone]    Zena Amos 12/31/2020, 11:18 AM

## 2020-12-31 NOTE — Care Management Important Message (Signed)
Important Message  Patient Details  Name: Nery Kalisz MRN: [id number] Date of Birth: January 24, 1946   Medicare Important Message Given:  Yes     Dorena Bodo 12/31/2020, 2:49 PM

## 2021-01-01 ENCOUNTER — Other Ambulatory Visit: Payer: Self-pay

## 2021-01-01 ENCOUNTER — Encounter: Payer: Self-pay | Admitting: Internal Medicine

## 2021-01-01 ENCOUNTER — Ambulatory Visit (INDEPENDENT_AMBULATORY_CARE_PROVIDER_SITE_OTHER): Payer: Medicare Other | Admitting: Internal Medicine

## 2021-01-01 ENCOUNTER — Telehealth: Payer: Self-pay | Admitting: Cardiology

## 2021-01-01 DIAGNOSIS — J9611 Chronic respiratory failure with hypoxia: Secondary | ICD-10-CM

## 2021-01-01 DIAGNOSIS — J449 Chronic obstructive pulmonary disease, unspecified: Secondary | ICD-10-CM

## 2021-01-01 DIAGNOSIS — J9612 Chronic respiratory failure with hypercapnia: Secondary | ICD-10-CM

## 2021-01-01 MED ORDER — AZITHROMYCIN 250 MG PO TABS: ORAL_TABLET | ORAL | 0 refills | Status: DC

## 2021-01-01 MED ORDER — AZITHROMYCIN 250 MG PO TABS: ORAL_TABLET | ORAL | 11 refills | Status: DC

## 2021-01-01 NOTE — Patient Instructions (Addendum)
Sleep medicine evaluation next available   No change in medications   Plan A = Automatic =  sprivaa 2 puffs each am (work on practice inhalations Instead of symbicort =  Budesonide and formoterol first thing in am and 12 hours later  in nebulizer  Plan B = Backup Only use your albuterol(proventil) inhaler as a rescue medication  Plan C = Crisis - only use your albuterol nebulizer if you first try Plan B and it fails to help > ok to use the nebulizer up to every 4 hours but if start needing it regularly call for immediate appointment  Plan D = Deltasone  If plan C not working well, add Prednisone 10 mg take  4 each am x 2 days,   2 each am x 2 days,  1 each am x 2 days and stop   For nasty mucus >  zpak   Congested cough > mucinex dm 1200 mg every 12 hours and use the flutter valve   Make sure you check your oxygen saturation  at your highest level of activity  to be sure it stays over 90% and adjust  02 flow upward to maintain this level if needed but remember to turn it back to previous settings when you stop (to conserve your supply).   Please schedule a follow up visit in 3 months but call sooner if needed

## 2021-01-01 NOTE — Progress Notes (Signed)
Subjective:   Patient ID: [name], female    DOB: Mar 17, 1946    MRN: [id number]   Brief patient profile:  75  yowf  quit smoking in May 2006 with dx pna/ ards resumed full activity including yardwork at wt 140 and pft's c/w restrictive changes 04/2010 at wt 170 but reported improvement on saba so maintained chronically on symbicort 160  With pfts 02/08/18  c/w GOLD III copd if use FEV1/VC ratio    History of Present Illness  July 18, 2010  1st pulmonary office eval in ER era c/o doe x 6 months progressive indolent onset with copd vs pf.  Ultimately required hosp 8/30-9/1 dx copd/pf ? cause on sulfasalzine and ACE inhib better on 02 and advair but very hoarse with sense of chest congestion and cough with white mucus day > night.  doe x > slow adls.stop advair Start symbicort 160 2 puffs first thing  in am and 2 puffs again in pm about 12 hours later  Work on inhaler technique:   stop benazapril start benicar 40/25  one daily Use 02 sleeping and any activity other than sitting still   04/10/2014 f/u ov/[name] re: restrictive lung dz/ RA/psoriatric/ symbicort 160 2bid  Chief Complaint  Patient presents with  . Follow-up    Pt states was advised to f/u per [facility].  Pt has been having increased SOB- relates to stress from dealing with her spouse's illness. She is using o2 pretty much 24/7.   trying to do more than baseline activity and finds she needs 02 more. rec Work on inhaler technique:   Ok to just use the symbicort 160 2 puffs in am to see what difference if any this makes in your breathing  Change 02 to 2lpm 24/7 and use 3lpm with activity    06/04/15  cabg      11/10/2017  f/u ov/Aasir Huff re: AB / pf related to ALI vs RA / MO  Chief Complaint  Patient presents with  . Follow-up    Breathing has been worse over the past 1-2 months. She states she gets winded just walking from her house to her car. She has been using her albuterol inhaler 4 x daily on average.   on 2lpm at rest and  sleeping needed up to 4lpm continous while doing rehab  but only has pulsed to 3lpm at home per [facility] Much better only while on prednisone / poor hfa noted  Doe = MMRC3 = can't walk 100 yards even at a slow pace at a flat grade s stopping due to sob  Even on 02 but not sure about sats as does not check rec Goal is to keep sats above 90%  - call if you want [employer] to re-evaluate your ambulatory needs  Prednisone 10 mg take  4 each am x 2 days,   2 each am x 2 days,  1 each am x 2 days and stop  Work on inhaler technique:      04/05/18  aecopd > pred x 6 days > back to baseline      10/11/2018  f/u ov/[name] re:  COPD GOLD III/ 02 dep / maint on just symb 160 2bid (no better with incruse) Chief Complaint  Patient presents with  . Follow-up    Breathing is the same. She is using her albuterol inhaler once per wk on average.   Dyspnea:  HT, sometimes [employer] on 3lpm MMRC3 = can't walk 100 yards even at a slow  pace at a flat grade s stopping due to sob   Cough: none Sleeping: on back books under hob plus pillows = 30 degrees  SABA use: rare  02: 2lpm but turns to 3lpm poc with activity    rec Prednisone 10 mg take  4 each am x 2 days,   2 each am x 2 days,  1 each am x 2 days and stop  Work on inhaler technique:   Please schedule a follow up visit in 4 months but call sooner if needed  - add consider adding spiriva next ov if not doing better and insurance will cover   04/18/2019  f/u ov/[name] re: GOLD III spirometry but 02 dep/ maint on symb 160 2bid/spiriva  Chief Complaint  Patient presents with  . Follow-up    Breathing not improving since the last visit. She states she has not been active at all. She has been using her albuterol daily.   Dyspnea:  No longer shopping due to covid 19 does delivery only  Cough: occ spells/ non productive never noct   Sleeping: on elevated to 30 degrees  SABA use: daily use of nebulizer  02: 2lpm but 3lpm with activity  rec Plan A = Automatic =Symbicort  160 x 2 pffs each am followed by spriiva 2 puffs and 12 hours later symbicort 160 x 2  Work on inhaler technique: Plan B = Backup Only use your albuterol(proventil) inhaler as a rescue medication  Plan C = Crisis - only use your albuterol nebulizer if you first try Plan B and it fails to help > ok to use the nebulizer up to every 4 hours but if start needing it regularly call for immediate appointment    06/14/2019  f/u ov/[name] re:  GOLD III spirometry but 02 dep 24/7/ maint on symb 160/spiriva [name] Chief Complaint  Patient presents with  . Follow-up    Breathing is overall doing about the same. She is using her albuterol once per wk on average.   Dyspnea:  room to room now on up to 3lpm with sats ok but feels losing ground steadily since prior ov in terms of activity tol/ housebound mostly at this poit   Cough: some spells daytime, not noct, mostly mucoid production Sleeping: 30 degrees elevation helps some SABA use: rarely hfa/ never neb at this point  02: 2lpm bedtime up to 3lpm with activity  rec Prednisone 10 mg Take 4 for three days 3 for three days 2 for three days 1 for three days and stop  No change in medications    07/15/2019  f/u ov/[name] re: copd  GOLD III spirometry / 02 dep maint on symb/spiriva/singulair  Chief Complaint  Patient presents with  . COPD III spirometry if use FEV1/VC p saba    Doing better since last visit with the combination of Symbicort and Spiriva   Dyspnea:  Up to 5 min on street x 3lpm = MMRC3 = can't walk 100 yards even at a slow pace at a flat grade s stopping due to sob  But not checking sats at peak ex  Cough: not now  Sleeping: 30 degrees hob / no am flares of cough/ wheeze/sob   SABA use: none 02: 2lpm hs, 2lpm resting, 3lpm vacuuming or walking outside 3lpm POC  rec Work on inhaler technique:    No change in medications or 02     11/08/2019  f/u ov/[name] re: GOLD III spirom but 02 dep  Chief Complaint  Patient presents with  .  Follow-up    Pt states shes been feeling about the same. Not as active. Coughs a little bit with "yellowish tan" phlegm. Denies any fever or chills.   Dyspnea: MMRC3 = can't walk 100 yards even at a slow pace at a flat grade s stopping due to sob   Cough: min rattling  Sleeping: 30 degrees hob  SABA use: much less  02: 2lpm hs, 2lpm resting, 3lpm vacuuming or walking outside 3lpm POC  rec Plan A = Automatic =Symbicort 160 x 2 pffs each am followed by spriiva 2 puffs and 12 hours later symbicort 160 x 2  Work on inhaler technique  Plan B = Backup Only use your albuterol(proventil) inhaler as a rescue medication  Plan C = Crisis - only use your albuterol nebulizer if you first try Plan B and it fails to help > ok to use the nebulizer up to every 4 hours but if start needing it regularly call for immediate appointment Plan D = Deltasone  If plan C not working well, add Prednisone 10 mg take  4 each am x 2 days,   2 each am x 2 days,  1 each am x 2 days and stop    07/02/2020  f/u ov/[name] re: GOLD III/ 02 dep maint on symb/spriva and freq pred  Chief Complaint  Patient presents with  . Follow-up    Gets extremely sob with activity  Dyspnea:  Across the room baseline / down the drive today did  better flat grade/ always better p prednisone   Cough: none  Sleeping: electric bed  30 degrees  SABA use: rarely  02:  3lpm 24/7  rec Plan A = Automatic =  spriiva 2 puffs each am (work on practice inhalations Instead of symbicort =  Budesonide and formoterol first thing in am and 12 hours later  in nebulizer Plan B = Backup Only use your albuterol(proventil) inhaler as a rescue medication Plan C = Crisis - only use your albuterol nebulizer if you first try Plan B and it fails to help > ok to use the nebulizer up to every 4 hours but if start needing it regularly call for immediate appointment Plan D = Deltasone  If plan C not working well, add Prednisone 10 mg take  4 each am x 2 days,   2 each  am x 2 days,  1 each am x 2 days and stop    Admit date: 12/27/2020 Discharge date: 12/31/2020   Recommendations for Outpatient Follow-up:  1. Follow up with PCP in 1-2 weeks 2. Follow-up with cardiology in 2 weeks 3. Follow-up with pulmonology in 1 week 4. Please arrange outpatient sleep study 5. Please obtain BMP/CBC in one week 6. 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 fib on Eliquis, CKD-3, psoriatic arthritis, sciatica, recent COVID-19 infection presented with progressive shortness of breath, near syncope and RLE radiculopathic pain, and admitted for acute on chronic hypoxic respiratory failure and near syncope in the setting of acute on chronic diastolic CHF, possible COPD exacerbation and anemia. She was a started on IV Lasix, BiPAP, antibiotics and nebulizers. Cardiology consulted on admission and evaluated patient. PCCM consulted and added steroid.  She was subsequently weaned  down and brought back to her 3 L of oxygen which is her baseline.  Subsequently her IV Lasix was transitioned to oral torsemide 20 mg p.o. daily by cardiology.  She was initially transitioned from long-acting to short-acting oral Cardizem but subsequently flipped back into sinus rhythm and cardiology switched her back to long-acting Cardizem to 40 mg p.o. daily.  She had mildly elevated troponins but they were flat suggestive of demand ischemia.  TEE did not show any wall motion abnormality.  She also had some anemia and her hemoglobin dropped to 7.8 for which she was transfused 1 unit of PRBC on 12/28/2020 along with 1 dose of IV for him.  Iron studies indicated iron deficiency anemia for which she is being prescribed oral iron at  discharge.  Her radiculopathic pain improved.  She was assessed by PT OT and they recommended home health with intermittent supervision and her daughter has committed to provide their supervision and she is willing to take patient home today.  She has been cleared by cardiology.  Rest of her medical issues remained stable.  She will follow with cardiology, PCP and pulmonology as outpatient.  She had received enough of IV antibiotics and steroids for her COPD.  Currently she has no wheezing and she is back to her baseline so no antibiotics or steroids are being prescribed.  Discharge Diagnoses:  Principal Problem:   Acute cardiogenic pulmonary edema (HCC) Active Problems:   Hypothyroidism   Essential hypertension   PULMONARY FIBROSIS ILD POST INFLAMMATORY CHRONIC   ACUTE ON CHRONIC DIASTOLIC HEART FAILURE   GERD (gastroesophageal reflux disease)   Morbid (severe) obesity due to excess calories (HCC)   S/P CABG x 3   Hyperlipidemia   COPD exacerbation (HCC)   Elevated troponin   Atrial flutter (HCC)   Acute on chronic respiratory failure with hypoxia and hypercapnia (HCC)   History of COVID-19   Sciatica   Diarrhea      01/01/2021  f/u ov/[name] re:  S/p covid/ rapid afib / off pred since ? And confused with instructions  Chief Complaint  Patient presents with  . Follow-up    Recent admission to hospital for COPD flare. Breathing is improving some, but not back to baseline. She has some wheezing and cough with clear sputum. She rarely uses her albuterol inhaler.    Dyspnea:   More limited by balance weakness  Cough: minimal Sleeping: electric bed x 30 degrees SABA use: rarely  02: 2lpm  at rest, sleep, 3 walking sats 98% Covid status:   2 vax  plus infected with likely omicron    No obvious day to day or daytime variability or assoc excess/ purulent sputum or mucus plugs or hemoptysis or cp or chest tightness, subjective wheeze or overt sinus or hb symptoms.   Sleeping without  nocturnal  or early am exacerbation  of respiratory  c/o's or need for noct saba. Also denies any obvious fluctuation of symptoms with weather or environmental changes or other aggravating or alleviating factors except as outlined above   No unusual exposure hx or h/o childhood pna/ asthma or knowledge of premature birth.  Current Allergies, Complete Past Medical History, Past Surgical History, Family History, and Social History were reviewed in [employer] record.  ROS  The following are not active complaints unless bolded Hoarseness, sore throat, dysphagia, dental problems, itching, sneezing,  nasal congestion or discharge of excess mucus or purulent secretions, ear ache,   fever, chills, sweats, unintended wt  loss or wt gain, classically pleuritic or exertional cp,  orthopnea pnd or arm/hand swelling  or leg swelling, presyncope, palpitations, abdominal pain, anorexia, nausea, vomiting, diarrhea  or change in bowel habits or change in bladder habits, change in stools or change in urine, dysuria, hematuria,  rash, arthralgias, visual complaints, headache, numbness, weakness or ataxia or problems with walking or coordination,  change in mood or  memory.        Current Meds - - NOTE:   Unable to verify as accurately reflecting what pt takes     Medication Sig  . acetaminophen (TYLENOL) 650 MG CR tablet Take 650 mg by mouth 2 (two) times daily.  Marland Kitchen albuterol (VENTOLIN HFA) 108 (90 Base) MCG/ACT inhaler Inhale 2 puffs into the lungs every 4 (four) hours as needed (shortness of breath, if you can't catch your breath).  Marland Kitchen apixaban (ELIQUIS) 5 MG TABS tablet Take 1 tablet (5 mg total) by mouth 2 (two) times daily.  Marland Kitchen atorvastatin (LIPITOR) 40 MG tablet TAKE 1 TABLET BY MOUTH  DAILY AT 6 PM. Please schedule appointment for future refills. Thank you (Patient taking differently: Take 40 mg by mouth daily.)  . budesonide (PULMICORT) 0.25 MG/2ML nebulizer solution One twice daily  (Patient taking differently: Take 0.25 mg by nebulization 2 (two) times daily.)  . CALCIUM PO Take 1 tablet by mouth daily.  . Cholecalciferol (VITAMIN D3) 2000 UNITS TABS Take 2,000 Int'l Units by mouth daily.  . citalopram (CELEXA) 40 MG tablet TAKE 1 TABLET BY MOUTH IN  THE MORNING (Patient taking differently: Take 40 mg by mouth daily.)  . cyclobenzaprine (FLEXERIL) 5 MG tablet TAKE 1 TABLET BY MOUTH AT  BEDTIME AS NEEDED FOR  MUSCLE SPASM(S) (Patient taking differently: Take 5 mg by mouth at bedtime as needed for muscle spasms.)  . diltiazem (CARDIZEM CD) 240 MG 24 hr capsule Take 1 capsule (240 mg total) by mouth daily.  . ferrous sulfate 325 (65 FE) MG tablet Take 1 tablet (325 mg total) by mouth daily with breakfast.  . formoterol (PERFOROMIST) 20 MCG/2ML nebulizer solution Take 2 mLs (20 mcg total) by nebulization 2 (two) times daily. Use in nebulizer twice daily perfectly regularly  . golimumab (SIMPONI ARIA) 50 MG/4ML SOLN injection Inject 50 mg into the vein every 8 (eight) weeks.   Marland Kitchen leflunomide (ARAVA) 20 MG tablet Take 1 tablet (20 mg total) by mouth daily.  Marland Kitchen levothyroxine (SYNTHROID) 125 MCG tablet TAKE 1 TABLET BY MOUTH EVERY DAY (Patient taking differently: Take 125 mcg by mouth daily before breakfast.)  . losartan (COZAAR) 50 MG tablet TAKE 1 TABLET BY MOUTH  DAILY (Patient taking differently: Take 50 mg by mouth daily.)  . montelukast (SINGULAIR) 10 MG tablet TAKE 1 TABLET BY MOUTH AT  BEDTIME (Patient taking differently: Take 10 mg by mouth at bedtime.)  . OXYGEN Inhale 3 L into the lungs continuous. continuous o2  . pantoprazole (PROTONIX) 40 MG tablet TAKE 1 TABLET BY MOUTH  DAILY BEFORE BREAKFAST (Patient taking differently: Take 40 mg by mouth daily before breakfast.)  . Tiotropium Bromide Monohydrate (SPIRIVA RESPIMAT) 2.5 MCG/ACT AERS USE 2 INHALATIONS BY MOUTH  EACH MORNING (Patient taking differently: Inhale 2 puffs into the lungs daily.)  . torsemide (DEMADEX) 20 MG  tablet Take 1 tablet (20 mg total) by mouth daily.  . traMADol (ULTRAM) 50 MG tablet Take 1 tablet (50 mg total) by mouth 2 (two) times daily.  Marland Kitchen triamcinolone cream (KENALOG) 0.1 % Apply 1 application topically  2 (two) times daily as needed (for psoriasis).                 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       - CT chest 07/03/10 Nonspecific PF mostly upper lobes       - CT chest 12/03/10 acute gg changes and effusions c/w chf        - PFT's  04/12/10 FEV1  1.21 (69%) ratio 77 and no change p B2,  DLC0 56% Asthmatic bronchitis     - HFA 50% p coaching July 18, 2010 >>  90%  02/25/2011  history of mild vitamin D deficiency        Objective:   Physical Exam    01/01/2021   201 07/02/2020   208  11/08/2019     204   wt 192 July 18, 2010 > 197 August 21, 2010 > 207 December 05, 2010  > 183  01/21/2011  > 179 02/25/2011 >189 08/10/2012 >195 09/08/12 >200 12/27/12 > 202 05/03/2013 > 06/28/2013  199 > 08/08/13 194 > 04/10/2014  203 > 04/19/2015   176 >  07/23/2015 166 > 09/16/2016  210 >  10/29/2016  209 > 04/29/2017    206 > 11/10/2017   209 > 02/08/2018  207 > 03/24/2018 209> 04/23/2018  208> 10/11/2018   208  > 04/18/2019  203 > 06/14/2019   208 > 07/15/2019   205    Vital signs reviewed  01/01/2021  - Note at rest 02 sats  89% on 2lpm POC   General appearance:    Obese wb nad     HEENT : pt wearing mask not removed for exam due to covid - 19 concerns.    NECK :  without JVD/Nodes/TM/ nl carotid upstrokes bilaterally   LUNGS: no acc muscle use,  Mild barrel  contour chest wall with bilateral  Distant bs s audible wheeze and   without cough on insp or exp maneuvers  and mild  Hyperresonant  to  percussion bilaterally     CV:  RRR  no s3 or murmur or increase in P2, and no edema   ABD:  Obese soft and nontender with pos end  insp Hoover's  in the supine position. No bruits or organomegaly appreciated, bowel sounds nl  MS:   Nl gait/  ext warm without deformities, calf tenderness, cyanosis or clubbing No obvious joint restrictions   SKIN: warm and dry without lesions    NEURO:  alert, approp, nl sensorium with  no motor or cerebellar deficits apparent.        I personally reviewed images and agree with radiology impression as follows:   Chest CTa 12/27/20 1. No pulmonary embolus identified. Upper lobe pulmonary artery branches are obscured by motion. 2. Acute pulmonary edema suspected. Small right greater than left pleural effusions. Confluent but enhancing right lower lobe airspace disease is more compatible with severe atelectasis than Pneumonia. Superimposed chronic lung scarring.

## 2021-01-01 NOTE — Telephone Encounter (Signed)
Pt c/o medication issue:  1. Name of Medication: diltiazem (CARDIZEM CD) 240 MG 24 hr capsule  2. How are you currently taking this medication (dosage and times per day)? Medication was changed and needs clarification  3. Are you having a reaction (difficulty breathing--STAT)? no  4. What is your medication issue? Patient's daughter calling to clarify how the patient is supposed to take her medication. She states the PA she saw on Tuesday changed it to 240 mg at night, and 120 mg in the morning.

## 2021-01-01 NOTE — Telephone Encounter (Signed)
Patient discharge AVS - back to 240mg  once a day of cardizem. Daughter, verbalized understanding and agreement of instructions.

## 2021-01-02 ENCOUNTER — Encounter: Payer: Self-pay | Admitting: Internal Medicine

## 2021-01-02 NOTE — Assessment & Plan Note (Signed)
02 dependent  since 07/02/10 >>  83% RA December 05, 2010       - ONO RA 08/05/12  :  Positive sat < 89 x 2:28m> repeat on 2lpm rec 08/12/2012  - 06/17/2013 reported desat with activity p Knee surgery > rec restart 2lpm with activity  - 06/27/2013   Walked 2lpm  x one lap @ 185 stopped due to sat 88% not sob , desat to 82% on RA just at the  Start of the walk  - 04/10/14  Sats  81% RA and then  Walked 3lpm x  2 laps @ 185 ft each stopped due to end of study, no desat  - HCO3  11/09/17     = 31 - HCO3  08/27/18 = 37   - HCO3  03/11/2019 = 33    Worse since covid 19 c/b acute pulmonary edema from RAF but should be gradually improving now and ok to titrate back to where she was as long as keep sats > 90%   May be a candidate for cpap/ bipap or NIV  > referred to sleep medicine          Each maintenance medication was reviewed in detail including emphasizing most importantly the difference between maintenance and prns and under what circumstances the prns are to be triggered using an action plan format where appropriate.  Total time for H and P, chart review, counseling, reviewing 02/smi/neb device(s) and generating customized AVS unique to this post hosp f/u office visit / same day charting = 27 min

## 2021-01-02 NOTE — Assessment & Plan Note (Signed)
Quit smoking May 2006       - PFT's  04/12/10 FEV1  1.21 (69%) ratio 77 and no change p B2,  DLC0 56%   VC 70%         - PFTs  08/08/2013 FEV1 1.21 (60%) ratio 86 and no change p B2 DLCO 79%  VC 72%  On symbicort 160 2bid  - PFT's  02/08/2018  FEV1 0.70 (40 % ) ratio 56 if use FEV1/VC  p 38 % improvement from saba p symb 160 prior to study with DLCO  78 % corrects to 147  % for alv volume   - 02/08/2018  After extensive coaching inhaler device  effectiveness =    75% with mdi and 90% with dpi so change to Trelegy > no change from sample so rec resume symb 160 2 bid as of 03/24/2018  - 04/18/2019    changed spiriva to smi  11/08/2019  After extensive coaching inhaler device,  effectiveness =    75% (short Ti) > continue symb / spiriva  - 11/08/2019 added Plan D = Deltasone x 6 days for flares  - 07/02/2020 change symbicort to  Budesonide and formoterol and continue spriva  Thoroughly confused with instructions at discharge as is her daughter, doesn't know the names of meds or correlate with instructions so advised to go back to the ABCD plan in place prior to admit and call if not effective  Re saba: I spent extra time with pt today reviewing appropriate use of albuterol for prn use on exertion with the following points: 1) saba is for relief of sob that does not improve by walking a slower pace or resting but rather if the pt does not improve after trying this first. 2) If the pt is convinced, as many are, that saba helps recover from activity faster then it's easy to tell if this is the case by re-challenging : ie stop, take the inhaler, then p 5 minutes try the exact same activity (intensity of workload) that just caused the symptoms and see if they are substantially diminished or not after saba 3) if there is an activity that reproducibly causes the symptoms, try the saba 15 min before the activity on alternate days   If in fact the saba really does help, then fine to continue to use it prn but advised may  need to look closer at the maintenance regimen being used to achieve better control of airways disease with exertion.

## 2021-01-03 ENCOUNTER — Encounter (HOSPITAL_COMMUNITY): Payer: Self-pay | Admitting: Physician Assistant

## 2021-01-03 ENCOUNTER — Ambulatory Visit (HOSPITAL_COMMUNITY)
Admission: RE | Admit: 2021-01-03 | Discharge: 2021-01-03 | Disposition: A | Discharge status: Home / Self Care | Payer: Medicare Other | Source: Ambulatory Visit | Attending: Physician Assistant | Admitting: Physician Assistant

## 2021-01-03 ENCOUNTER — Other Ambulatory Visit: Payer: Self-pay

## 2021-01-03 VITALS — BP 122/78 | HR 94 | Ht 59.0 in | Wt 196.2 lb

## 2021-01-03 DIAGNOSIS — M5459 Other low back pain: Secondary | ICD-10-CM | POA: Diagnosis not present

## 2021-01-03 DIAGNOSIS — Z7901 Long term (current) use of anticoagulants: Secondary | ICD-10-CM | POA: Diagnosis not present

## 2021-01-03 DIAGNOSIS — Z951 Presence of aortocoronary bypass graft: Secondary | ICD-10-CM | POA: Diagnosis not present

## 2021-01-03 DIAGNOSIS — I2510 Atherosclerotic heart disease of native coronary artery without angina pectoris: Secondary | ICD-10-CM | POA: Insufficient documentation

## 2021-01-03 DIAGNOSIS — I482 Chronic atrial fibrillation, unspecified: Secondary | ICD-10-CM | POA: Insufficient documentation

## 2021-01-03 DIAGNOSIS — I252 Old myocardial infarction: Secondary | ICD-10-CM | POA: Insufficient documentation

## 2021-01-03 DIAGNOSIS — E669 Obesity, unspecified: Secondary | ICD-10-CM | POA: Diagnosis not present

## 2021-01-03 DIAGNOSIS — Z79899 Other long term (current) drug therapy: Secondary | ICD-10-CM | POA: Diagnosis not present

## 2021-01-03 DIAGNOSIS — E785 Hyperlipidemia, unspecified: Secondary | ICD-10-CM | POA: Insufficient documentation

## 2021-01-03 DIAGNOSIS — I491 Atrial premature depolarization: Secondary | ICD-10-CM | POA: Diagnosis not present

## 2021-01-03 DIAGNOSIS — I5033 Acute on chronic diastolic (congestive) heart failure: Secondary | ICD-10-CM | POA: Diagnosis not present

## 2021-01-03 DIAGNOSIS — I4892 Unspecified atrial flutter: Secondary | ICD-10-CM | POA: Diagnosis not present

## 2021-01-03 DIAGNOSIS — Z87891 Personal history of nicotine dependence: Secondary | ICD-10-CM | POA: Diagnosis not present

## 2021-01-03 DIAGNOSIS — I480 Paroxysmal atrial fibrillation: Secondary | ICD-10-CM | POA: Diagnosis not present

## 2021-01-03 DIAGNOSIS — D6869 Other thrombophilia: Secondary | ICD-10-CM

## 2021-01-03 DIAGNOSIS — I110 Hypertensive heart disease with heart failure: Secondary | ICD-10-CM | POA: Diagnosis not present

## 2021-01-03 DIAGNOSIS — Z6839 Body mass index (BMI) 39.0-39.9, adult: Secondary | ICD-10-CM | POA: Diagnosis not present

## 2021-01-03 DIAGNOSIS — J449 Chronic obstructive pulmonary disease, unspecified: Secondary | ICD-10-CM | POA: Insufficient documentation

## 2021-01-03 NOTE — Progress Notes (Signed)
Primary Care Physician: [name], [name], MD Primary Cardiologist: Dr Izora Ribas Primary Electrophysiologist: none Referring Physician: Dr Herminio Commons Lalani [name] is a 75 y.o. female with a history of CAD s/p NSTEMI and CABG, COPD, HTN, HLD, ILD on Home O2, diastolic HF, and atrial flutter who presents for follow up in the [facility].  The patient was initially diagnosed with atrial flutter on a cardiac monitor 12/24/20 with Zio patch showing both atrial flutter and atrial tachycardia 37% burden with rapid rates. The monitor was placed for symptoms of palpitations. Of note, she was diagnosed with COVID 11/22/20.  She has a CHADS2VASC score of 5. Her diltiazem was increased to 240 mg daily on 2/3 and she reports her palpitations have improved although still present. Patient did have some increased lower extremity edema and SOB. She admits she skipped several doses of her Lasix. She resumed the medication and her symptoms resolved.   On follow up today, patient was admitted 2/24-2/28/22 with acute respiratory failure secondary to acute on chronic diastolic CHF, rapid atrial fibrillation, and COPD exacerbation. She was transitioned to torsemide. She spontaneously converted to SR with treatment of COPD and diureses. She reports that she has done well since leaving the hospital and her weight is down ~4 lbs. She remains in SR. She denies any bleeding issues on anticoagulation.   Today, she denies symptoms of palpitations, chest pain, orthopnea, PND, lower extremity edema, dizziness, presyncope, syncope, snoring, daytime somnolence, bleeding, or neurologic sequela. The patient is tolerating medications without difficulties and is otherwise without complaint today.    Atrial Fibrillation Risk Factors:  she does not have symptoms or diagnosis of sleep apnea. she does not have a history of rheumatic fever.   she has a BMI of Body mass index is 39.63  kg/m.Marland Kitchen Filed Weights   01/03/21 1503  Weight: 89 kg    Family History  Problem Relation Age of Onset  . Breast cancer Mother   . Coronary artery disease Father   . Rheum arthritis Father      Atrial Fibrillation Management history:  Previous antiarrhythmic drugs: none Previous cardioversions: none Previous ablations: none CHADS2VASC score: 5 Anticoagulation history: Eliquis   Past Medical History:  Diagnosis Date  . Arthritis    OA RIGHT KNEE WITH PAIN  . Barrett esophagus   . Bradycardia 06/01/2015  . CAD in native artery    a. NSTEMI 05/2015 s/p emergent CABG.  . Chronic diastolic (congestive) heart failure (HCC)   . Chronic kidney disease, stage 3a (HCC)   . Chronic respiratory failure (HCC)   . Chronic respiratory failure with hypoxia and hypercapnia (HCC) 02/04/2010   Followed in Pulmonary clinic/ [facility]       - 02 dependent  since 07/02/10 >>  83% RA December 05, 2010       - ONO RA 08/05/12  :  Positive sat < 89 x 2:74m> repeat on 2lpm rec 08/12/2012  - 06/17/2013 reported desat with activity p Knee surgery > rec restart 2lpm with activity  - 06/27/2013   Walked 2lpm  x one lap @ 185 stopped due to sat 88% not sob , desat to 82% on RA just at th  . COPD III spirometry if use FEV1/VC p saba  07/18/2010   Quit smoking May 2006       - PFT's  04/12/10 FEV1  1.21 (69%) ratio 77 and no change p B2,  DLC0 56%  VC 70%         - PFTs  08/08/2013 FEV1 1.21 (60%) ratio 86 and no change p B2 DLCO 79%  VC 72%  On symbicort 160 2bid  - PFT's  02/08/2018  FEV1 0.70 (40 % ) ratio 56 if use FEV1/VC  p 38 % improvement from saba p symb 160 prior to study with DLCO  78 % corrects to 147  % for alv volume   - 02/08/2018  . Cough variant asthma 02/26/2011   Followed in Pulmonary clinic/ [facility]  - PFT's  06/04/15  FEV1 1.20 (67 % ) ratio 83  p 6 % improvement from saba with DLCO  80 % corrects to 132 % for alv volume      - Clinical dx based on response to symbicort        FENO 09/16/2016  =   96 on symbicort 160 2bid > added singulair  Allergy profile 09/16/2016 >  Eos 0.5 /  IgE  78 neg RAST  -  Referred to rehab 04/29/2017 > completed  . Essential hypertension 04/20/2007   Qualifier: Diagnosis of  By: Marcelyn Ditty RN, Katy Fitch   . GERD (gastroesophageal reflux disease)   . History of ARDS 2006  . History of home oxygen therapy    AT NIGHT WHEN SLEEPING 2 L / MIN NASAL CANNULA  . Hyperlipidemia 07/12/2015  . Hypothyroidism   . Intracranial hemorrhage (HCC) 2019   a. small intracranial hemorrhage in setting of HTN.  . Mild carotid artery disease (HCC)    a. Duplex 1-39% bilaterally in 2016.  . Morbid (severe) obesity due to excess calories (HCC) 04/22/2015   pfts with erv 14% 06/04/15  And 33% 02/08/2018   . NSTEMI (non-ST elevated myocardial infarction) (HCC) 05/31/2015  . Pneumococcal pneumonia (HCC) 2006   HOSPITALIZED AND DEVELOPED ARDS  . Psoriatic arthritis (HCC)   . PULMONARY FIBROSIS ILD POST INFLAMMATORY CHRONIC 07/18/2010   Followed as Primary Care Patient/ [facility]  -s/p ARDS 2006 with bacteremic S  Pna       - CT chest 07/03/10 Nonspecific PF mostly upper lobes       - CT chest 12/03/10 acute gg changes and effusions c/w chf - PFT's  02/08/2018  FVC 0.64 (28 %)   with DLCO  78 % corrects to 147 % for alv volume     . Rheumatic disease   . 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 SOB (shortness of breath) on exertion    Past Surgical History:  Procedure Laterality Date  . ABDOMINOPLASTY    . CARDIAC CATHETERIZATION N/A 06/01/2015   Procedure: Left Heart Cath and Coronary Angiography;  Surgeon: Runell Gess, MD;  Location: [facility] INVASIVE CV LAB;  Service: Cardiovascular;  Laterality: N/A;  . CARPAL TUNNEL RELEASE    . CHOLECYSTECTOMY    . CORONARY ARTERY BYPASS GRAFT N/A 06/04/2015   Procedure: CORONARY ARTERY BYPASS GRAFT times three            with left internal mammary artery and right leg saphenous vein;  Surgeon: Alleen Borne, MD;  Location: [facility] OR;   Service: Open Heart Surgery;  Laterality: N/A;  . cosmetic breast surgery    . JOINT REPLACEMENT    . KNEE ARTHROSCOPY Left   . TEE WITHOUT CARDIOVERSION  06/04/2015   Procedure: TRANSESOPHAGEAL ECHOCARDIOGRAM (TEE);  Surgeon: Alleen Borne, MD;  Location: [facility] OR;  Service: Open Heart Surgery;;  . TOTAL KNEE ARTHROPLASTY  Left   . TOTAL KNEE ARTHROPLASTY Right 06/06/2013   Procedure: RIGHT TOTAL KNEE ARTHROPLASTY;  Surgeon: Loanne Drilling, MD;  Location: [facility] ORS;  Service: Orthopedics;  Laterality: Right;    Current Outpatient Medications  Medication Sig Dispense Refill  . acetaminophen (TYLENOL) 650 MG CR tablet Take 650 mg by mouth 2 (two) times daily.    Marland Kitchen albuterol (VENTOLIN HFA) 108 (90 Base) MCG/ACT inhaler Inhale 2 puffs into the lungs every 4 (four) hours as needed (shortness of breath, if you can't catch your breath). 8 g 5  . amoxicillin (AMOXIL) 500 MG tablet Take 1,000 mg by mouth as directed. Dental procedure    . apixaban (ELIQUIS) 5 MG TABS tablet Take 1 tablet (5 mg total) by mouth 2 (two) times daily. 60 tablet 3  . atorvastatin (LIPITOR) 40 MG tablet TAKE 1 TABLET BY MOUTH  DAILY AT 6 PM. Please schedule appointment for future refills. Thank you (Patient taking differently: TAKE 1 TABLET BY MOUTH  DAILY AT 6 PM. Please schedule appointment for future refills. Thank you) 90 tablet 0  . azithromycin (ZITHROMAX) 250 MG tablet Take 2 on day one then 1 daily x 4 days 6 tablet 11  . budesonide (PULMICORT) 0.25 MG/2ML nebulizer solution One twice daily 120 mL 12  . CALCIUM PO Take 1 tablet by mouth daily.    . Cholecalciferol (VITAMIN D3) 2000 UNITS TABS Take 2,000 Int'l Units by mouth daily.    . citalopram (CELEXA) 40 MG tablet TAKE 1 TABLET BY MOUTH IN  THE MORNING 90 tablet 3  . cyclobenzaprine (FLEXERIL) 5 MG tablet TAKE 1 TABLET BY MOUTH AT  BEDTIME AS NEEDED FOR  MUSCLE SPASM(S) (Patient taking differently: TAKE 1 TABLET BY MOUTH AT  BEDTIME AS NEEDED FOR  MUSCLE SPASM(S)) 90  tablet 0  . diltiazem (CARDIZEM CD) 240 MG 24 hr capsule Take 1 capsule (240 mg total) by mouth daily. 90 capsule 1  . ferrous sulfate 325 (65 FE) MG tablet Take 1 tablet (325 mg total) by mouth daily with breakfast. 30 tablet 0  . formoterol (PERFOROMIST) 20 MCG/2ML nebulizer solution Take 2 mLs (20 mcg total) by nebulization 2 (two) times daily. Use in nebulizer twice daily perfectly regularly 120 mL 11  . golimumab (SIMPONI ARIA) 50 MG/4ML SOLN injection Inject 50 mg into the vein every 8 (eight) weeks.     Marland Kitchen leflunomide (ARAVA) 20 MG tablet Take 1 tablet (20 mg total) by mouth daily.    Marland Kitchen levothyroxine (SYNTHROID) 125 MCG tablet TAKE 1 TABLET BY MOUTH EVERY DAY 90 tablet 1  . losartan (COZAAR) 50 MG tablet TAKE 1 TABLET BY MOUTH  DAILY 90 tablet 3  . montelukast (SINGULAIR) 10 MG tablet TAKE 1 TABLET BY MOUTH AT  BEDTIME 90 tablet 3  . OXYGEN Inhale 3 L into the lungs continuous. continuous o2    . pantoprazole (PROTONIX) 40 MG tablet TAKE 1 TABLET BY MOUTH  DAILY BEFORE BREAKFAST 90 tablet 3  . predniSONE (DELTASONE) 10 MG tablet Take by mouth.    . Tiotropium Bromide Monohydrate (SPIRIVA RESPIMAT) 2.5 MCG/ACT AERS USE 2 INHALATIONS BY MOUTH  EACH MORNING (Patient taking differently: USE 2 INHALATIONS BY MOUTH  EACH MORNING) 12 g 4  . torsemide (DEMADEX) 20 MG tablet Take 1 tablet (20 mg total) by mouth daily. 30 tablet 0  . traMADol (ULTRAM) 50 MG tablet Take 1 tablet (50 mg total) by mouth 2 (two) times daily. 60 tablet 1  . triamcinolone cream (  KENALOG) 0.1 % Apply 1 application topically 2 (two) times daily as needed (for psoriasis).     . vitamin B-12 (CYANOCOBALAMIN) 1000 MCG tablet Take 1,000 mcg by mouth daily.     No current facility-administered medications for this encounter.    Allergies  Allergen Reactions  . Gabapentin Other (See Comments)    Dizziness, lighthead    Social History   Socioeconomic History  . Marital status: Widowed    Spouse name: Not on file  .  Number of children: Not on file  . Years of education: Not on file  . Highest education level: Not on file  Occupational History  . Occupation: retired    Associate Professor: Designer, jewellery  Tobacco Use  . Smoking status: Former Smoker    Packs/day: 1.50    Years: 58.50    Pack years: 87.75    Types: Cigarettes    Quit date: 03/03/2005    Years since quitting: 15.8  . Smokeless tobacco: Never Used  Vaping Use  . Vaping Use: Never used  Substance and Sexual Activity  . Alcohol use: No  . Drug use: No  . Sexual activity: Not on file  Other Topics Concern  . Not on file  Social History Narrative  . Not on file   Social Determinants of Health   Financial Resource Strain: Low Risk   . Difficulty of Paying Living Expenses: Not hard at all  Food Insecurity: Not on file  Transportation Needs: No Transportation Needs  . Lack of Transportation (Medical): No  . Lack of Transportation (Non-Medical): No  Physical Activity: Not on file  Stress: Not on file  Social Connections: Not on file  Intimate Partner Violence: Not on file     ROS- All systems are reviewed and negative except as per the HPI above.  Physical Exam: Vitals:   01/03/21 1503  BP: 122/78  Pulse: 94  Weight: 89 kg  Height: 4\' 11"  (1.499 m)    GEN- The patient is a well appearing obese female, alert and oriented x 3 today.   HEENT-head normocephalic, atraumatic, sclera clear, conjunctiva pink, hearing intact, trachea midline. Lungs- Diminished breath sounds, mild wheezing, normal work of breathing Heart- Regular rate and rhythm, occasional ectopic beat, no murmurs, rubs or gallops  GI- soft, NT, ND, + BS Extremities- no clubbing, cyanosis, or edema MS- no significant deformity or atrophy Skin- no rash or lesion Psych- euthymic mood, full affect Neuro- strength and sensation are intact   Wt Readings from Last 3 Encounters:  01/03/21 89 kg  01/01/21 91.2 kg  12/31/20 89.3 kg    EKG today demonstrates  SR, PACs,  inc RBBB Vent. rate 94 BPM PR interval 164 ms QRS duration 90 ms QT/QTc 404/505 ms  Echo 08/26/18 demonstrated  Left ventricle: The cavity size was normal. There was mild  concentric hypertrophy. Systolic function was vigorous. The  estimated ejection fraction was in the range of 65% to 70%. Wall  motion was normal; there were no regional wall motion  abnormalities. Doppler parameters are consistent with abnormal  left ventricular relaxation (grade 1 diastolic dysfunction).  - Mitral valve: Calcified annulus. Valve area by pressure  half-time: 2.18 cm^2.  - Left atrium: The atrium was mildly dilated.   Epic records are reviewed at length today  CHA2DS2-VASc Score = 7  The patient's score is based upon: CHF History: Yes HTN History: Yes Diabetes History: No Stroke History: Yes Vascular Disease History: Yes Age Score: 1 Gender Score: 1  ASSESSMENT AND PLAN: 1. Paroxysmal atrial fibrillation/Atrial flutter The patient's CHA2DS2-VASc score is 7, indicating a 11.2% annual risk of stroke.   Patient appears to be maintaining SR. Continue Eliquis 5 mg BID Continue diltiazem 240 mg daily  2. Secondary Hypercoagulable State (ICD10:  D68.69) The patient is at significant risk for stroke/thromboembolism based upon her CHA2DS2-VASc Score of 7.  Continue Apixaban (Eliquis).   3. Obesity Body mass index is 39.63 kg/m. Lifestyle modification was discussed and encouraged including regular physical activity and weight reduction.  4. CAD S/p CABG No anginal symptoms.  5. HTN Stable, no changes today.  6. Chronic diastolic CHF Admitted with acute on chronic diastolic CHF and COPD exacerbation. Patient transitioned to torsemide.  No signs or symptoms of fluid overload today.   Follow up with Dr Izora Ribas as scheduled.    Jorja Loa PA-C [facility] [address] [place], [place] [address] [phone] 01/03/2021 3:11 PM

## 2021-01-07 ENCOUNTER — Encounter: Payer: Self-pay | Admitting: Internal Medicine

## 2021-01-07 ENCOUNTER — Other Ambulatory Visit: Payer: Self-pay

## 2021-01-07 ENCOUNTER — Telehealth: Payer: Self-pay | Admitting: Internal Medicine

## 2021-01-07 ENCOUNTER — Ambulatory Visit (INDEPENDENT_AMBULATORY_CARE_PROVIDER_SITE_OTHER): Payer: Medicare Other | Admitting: Internal Medicine

## 2021-01-07 VITALS — BP 120/68 | HR 74 | Ht 59.0 in | Wt 195.0 lb

## 2021-01-07 DIAGNOSIS — L4050 Arthropathic psoriasis, unspecified: Secondary | ICD-10-CM | POA: Diagnosis not present

## 2021-01-07 DIAGNOSIS — I5032 Chronic diastolic (congestive) heart failure: Secondary | ICD-10-CM | POA: Insufficient documentation

## 2021-01-07 DIAGNOSIS — D6869 Other thrombophilia: Secondary | ICD-10-CM | POA: Diagnosis not present

## 2021-01-07 DIAGNOSIS — M4854XD Collapsed vertebra, not elsewhere classified, thoracic region, subsequent encounter for fracture with routine healing: Secondary | ICD-10-CM | POA: Diagnosis not present

## 2021-01-07 DIAGNOSIS — I130 Hypertensive heart and chronic kidney disease with heart failure and stage 1 through stage 4 chronic kidney disease, or unspecified chronic kidney disease: Secondary | ICD-10-CM | POA: Diagnosis not present

## 2021-01-07 DIAGNOSIS — M069 Rheumatoid arthritis, unspecified: Secondary | ICD-10-CM | POA: Diagnosis not present

## 2021-01-07 DIAGNOSIS — I10 Essential (primary) hypertension: Secondary | ICD-10-CM

## 2021-01-07 DIAGNOSIS — I700 Atherosclerosis of aorta: Secondary | ICD-10-CM | POA: Diagnosis not present

## 2021-01-07 DIAGNOSIS — N1831 Chronic kidney disease, stage 3a: Secondary | ICD-10-CM | POA: Diagnosis not present

## 2021-01-07 DIAGNOSIS — M4804 Spinal stenosis, thoracic region: Secondary | ICD-10-CM | POA: Diagnosis not present

## 2021-01-07 DIAGNOSIS — J441 Chronic obstructive pulmonary disease with (acute) exacerbation: Secondary | ICD-10-CM | POA: Diagnosis not present

## 2021-01-07 DIAGNOSIS — I252 Old myocardial infarction: Secondary | ICD-10-CM | POA: Diagnosis not present

## 2021-01-07 DIAGNOSIS — I4892 Unspecified atrial flutter: Secondary | ICD-10-CM

## 2021-01-07 DIAGNOSIS — I501 Left ventricular failure: Secondary | ICD-10-CM | POA: Diagnosis not present

## 2021-01-07 DIAGNOSIS — I2510 Atherosclerotic heart disease of native coronary artery without angina pectoris: Secondary | ICD-10-CM | POA: Diagnosis not present

## 2021-01-07 DIAGNOSIS — F32A Depression, unspecified: Secondary | ICD-10-CM | POA: Diagnosis not present

## 2021-01-07 DIAGNOSIS — I4891 Unspecified atrial fibrillation: Secondary | ICD-10-CM | POA: Diagnosis not present

## 2021-01-07 DIAGNOSIS — J80 Acute respiratory distress syndrome: Secondary | ICD-10-CM | POA: Diagnosis not present

## 2021-01-07 DIAGNOSIS — Z951 Presence of aortocoronary bypass graft: Secondary | ICD-10-CM

## 2021-01-07 DIAGNOSIS — N1832 Chronic kidney disease, stage 3b: Secondary | ICD-10-CM | POA: Insufficient documentation

## 2021-01-07 DIAGNOSIS — K219 Gastro-esophageal reflux disease without esophagitis: Secondary | ICD-10-CM | POA: Diagnosis not present

## 2021-01-07 DIAGNOSIS — J449 Chronic obstructive pulmonary disease, unspecified: Secondary | ICD-10-CM | POA: Diagnosis not present

## 2021-01-07 DIAGNOSIS — E039 Hypothyroidism, unspecified: Secondary | ICD-10-CM | POA: Diagnosis not present

## 2021-01-07 DIAGNOSIS — J9622 Acute and chronic respiratory failure with hypercapnia: Secondary | ICD-10-CM | POA: Diagnosis not present

## 2021-01-07 DIAGNOSIS — M5431 Sciatica, right side: Secondary | ICD-10-CM | POA: Diagnosis not present

## 2021-01-07 DIAGNOSIS — J9621 Acute and chronic respiratory failure with hypoxia: Secondary | ICD-10-CM | POA: Diagnosis not present

## 2021-01-07 DIAGNOSIS — J8410 Pulmonary fibrosis, unspecified: Secondary | ICD-10-CM | POA: Diagnosis not present

## 2021-01-07 DIAGNOSIS — I5033 Acute on chronic diastolic (congestive) heart failure: Secondary | ICD-10-CM | POA: Diagnosis not present

## 2021-01-07 DIAGNOSIS — I5031 Acute diastolic (congestive) heart failure: Secondary | ICD-10-CM

## 2021-01-07 DIAGNOSIS — J849 Interstitial pulmonary disease, unspecified: Secondary | ICD-10-CM | POA: Diagnosis not present

## 2021-01-07 DIAGNOSIS — D631 Anemia in chronic kidney disease: Secondary | ICD-10-CM | POA: Diagnosis not present

## 2021-01-07 LAB — CBC
Hematocrit: 32.3 % — ABNORMAL LOW (ref 34.0–46.6)
Hemoglobin: 10.4 g/dL — ABNORMAL LOW (ref 11.1–15.9)
MCH: 28 pg (ref 26.6–33.0)
MCHC: 32.2 g/dL (ref 31.5–35.7)
MCV: 87 fL (ref 79–97)
Platelets: 170 10*3/uL (ref 150–450)
RBC: 3.71 x10E6/uL — ABNORMAL LOW (ref 3.77–5.28)
RDW: 14.5 % (ref 11.7–15.4)
WBC: 12.5 10*3/uL — ABNORMAL HIGH (ref 3.4–10.8)

## 2021-01-07 LAB — MAGNESIUM: Magnesium: 1.8 mg/dL (ref 1.6–2.3)

## 2021-01-07 LAB — BASIC METABOLIC PANEL
BUN/Creatinine Ratio: 28 (ref 12–28)
BUN: 29 mg/dL — ABNORMAL HIGH (ref 8–27)
CO2: 30 mmol/L — ABNORMAL HIGH (ref 20–29)
Calcium: 8.9 mg/dL (ref 8.7–10.3)
Chloride: 95 mmol/L — ABNORMAL LOW (ref 96–106)
Creatinine, Ser: 1.02 mg/dL — ABNORMAL HIGH (ref 0.57–1.00)
Glucose: 97 mg/dL (ref 65–99)
Potassium: 4 mmol/L (ref 3.5–5.2)
Sodium: 139 mmol/L (ref 134–144)
eGFR: 58 mL/min/{1.73_m2} — ABNORMAL LOW (ref >59)

## 2021-01-07 NOTE — Patient Instructions (Addendum)
Medication Instructions:  Your physician recommends that you continue on your current medications as directed. Please refer to the Current Medication list given to you today.  *If you need a refill on your cardiac medications before your next appointment, please call your pharmacy*   Lab Work: TODAY: CBC. BMP, and Mg If you have labs (blood work) drawn today and your tests are completely normal, you will receive your results only by: Marland Kitchen MyChart Message (if you have MyChart) OR . A paper copy in the mail If you have any lab test that is abnormal or we need to change your treatment, we will call you to review the results.   Testing/Procedures: NONE   Follow-Up: At [facility], you and your health needs are our priority.  As part of our continuing mission to provide you with exceptional heart care, we have created designated Provider Care Teams.  These Care Teams include your primary Cardiologist (physician) and Advanced Practice Providers (APPs -  Physician Assistants and Nurse Practitioners) who all work together to provide you with the care you need, when you need it.  We recommend signing up for the patient portal called "MyChart".  Sign up information is provided on this After Visit Summary.  MyChart is used to connect with patients for Virtual Visits (Telemedicine).  Patients are able to view lab/test results, encounter notes, upcoming appointments, etc.  Non-urgent messages can be sent to your provider as well.   To learn more about what you can do with MyChart, go to [email or web address].    Your next appointment:   6-8 month(s)  The format for your next appointment:   In Person  Provider:   You may see Riley Lam, MD or one of the following Advanced Practice Providers on your designated Care Team:    Ronie Spies, PA-C  Jacolyn Reedy, PA-C

## 2021-01-07 NOTE — Progress Notes (Signed)
Cardiology Office Note:    Date:  01/07/2021   ID:  [name], [name] [birth year], MRN [id number]  PCP:  [name], Amber Patricia, MD   [facility]  Cardiologist:  Riley Lam MD Advanced Practice Provider:  No care team member to display Electrophysiologist:  None       CC: Follow up AFl and HF  History of Present Illness:    [name] is a 75 y.o. female with a hx of hx of CAD s/p NSTEMI with 3 VD but with preserved EF; COPD and ILD on Home O2 diastolic HF seen by Dr. Delton See.  Seen 11/30/20 for virtual visit for SVT.  In interim of this visit, patient had heart monitor notable for AFl.  Seen by AF clinic, stared on [facility], had some LE edema issues on placed on torsemide.  Patient notes that she is doing great.  Since last visit notes  No bleeding issues on eliquis.  Had inpatient admission for SBO and had IV lasix; with DC on torsemide. Relevant interval testing or therapy include transition to torsemide.    No chest pain or pressure.  No SOB/DOB and improved on torseminde.  Notes weight loss and improvement with torsemide.  No palpitations or syncope.  Ambulatory blood pressure 120/80s.  Past Medical History:  Diagnosis Date  . Arthritis    OA RIGHT KNEE WITH PAIN  . Barrett esophagus   . Bradycardia 06/01/2015  . CAD in native artery    a. NSTEMI 05/2015 s/p emergent CABG.  . Chronic diastolic (congestive) heart failure (HCC)   . Chronic kidney disease, stage 3a (HCC)   . Chronic respiratory failure (HCC)   . Chronic respiratory failure with hypoxia and hypercapnia (HCC) 02/04/2010   Followed in Pulmonary clinic/ [facility]/ [place]       - 02 dependent  since 07/02/10 >>  83% RA December 05, 2010       - ONO RA 08/05/12  :  Positive sat < 89 x 2:32m> repeat on 2lpm rec 08/12/2012  - 06/17/2013 reported desat with activity p Knee surgery > rec restart 2lpm with activity  - 06/27/2013   Walked 2lpm  x one lap @ 185 stopped due to sat  88% not sob , desat to 82% on RA just at th  . COPD III spirometry if use FEV1/VC p saba  07/18/2010   Quit smoking May 2006       - PFT's  04/12/10 FEV1  1.21 (69%) ratio 77 and no change p B2,  DLC0 56%   VC 70%         - PFTs  08/08/2013 FEV1 1.21 (60%) ratio 86 and no change p B2 DLCO 79%  VC 72%  On symbicort 160 2bid  - PFT's  02/08/2018  FEV1 0.70 (40 % ) ratio 56 if use FEV1/VC  p 38 % improvement from saba p symb 160 prior to study with DLCO  78 % corrects to 147  % for alv volume   - 02/08/2018  . Cough variant asthma 02/26/2011   Followed in Pulmonary clinic/ [facility]/ [place]  - PFT's  06/04/15  FEV1 1.20 (67 % ) ratio 83  p 6 % improvement from saba with DLCO  80 % corrects to 132 % for alv volume      - Clinical dx based on response to symbicort       FENO 09/16/2016  =   96 on symbicort 160  2bid > added singulair  Allergy profile 09/16/2016 >  Eos 0.5 /  IgE  78 neg RAST  -  Referred to rehab 04/29/2017 > completed  . Essential hypertension 04/20/2007   Qualifier: Diagnosis of  By: Marcelyn Ditty RN, Katy Fitch   . GERD (gastroesophageal reflux disease)   . History of ARDS 2006  . History of home oxygen therapy    AT NIGHT WHEN SLEEPING 2 L / MIN NASAL CANNULA  . Hyperlipidemia 07/12/2015  . Hypothyroidism   . Intracranial hemorrhage (HCC) 2019   a. small intracranial hemorrhage in setting of HTN.  . Mild carotid artery disease (HCC)    a. Duplex 1-39% bilaterally in 2016.  . Morbid (severe) obesity due to excess calories (HCC) 04/22/2015   pfts with erv 14% 06/04/15  And 33% 02/08/2018   . NSTEMI (non-ST elevated myocardial infarction) (HCC) 05/31/2015  . Pneumococcal pneumonia (HCC) 2006   HOSPITALIZED AND DEVELOPED ARDS  . Psoriatic arthritis (HCC)   . PULMONARY FIBROSIS ILD POST INFLAMMATORY CHRONIC 07/18/2010   Followed as Primary Care Patient/ [facility]/ [place]  -s/p ARDS 2006 with bacteremic S  Pna       - CT chest 07/03/10 Nonspecific PF mostly upper lobes       - CT chest 12/03/10 acute  gg changes and effusions c/w chf - PFT's  02/08/2018  FVC 0.64 (28 %)   with DLCO  78 % corrects to 147 % for alv volume     . Rheumatic disease   . 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 SOB (shortness of breath) on exertion     Past Surgical History:  Procedure Laterality Date  . ABDOMINOPLASTY    . CARDIAC CATHETERIZATION N/A 06/01/2015   Procedure: Left Heart Cath and Coronary Angiography;  Surgeon: Runell Gess, MD;  Location: [facility] INVASIVE CV LAB;  Service: Cardiovascular;  Laterality: N/A;  . CARPAL TUNNEL RELEASE    . CHOLECYSTECTOMY    . CORONARY ARTERY BYPASS GRAFT N/A 06/04/2015   Procedure: CORONARY ARTERY BYPASS GRAFT times three            with left internal mammary artery and right leg saphenous vein;  Surgeon: Alleen Borne, MD;  Location: [facility] OR;  Service: Open Heart Surgery;  Laterality: N/A;  . cosmetic breast surgery    . JOINT REPLACEMENT    . KNEE ARTHROSCOPY Left   . TEE WITHOUT CARDIOVERSION  06/04/2015   Procedure: TRANSESOPHAGEAL ECHOCARDIOGRAM (TEE);  Surgeon: Alleen Borne, MD;  Location: [facility] OR;  Service: Open Heart Surgery;;  . TOTAL KNEE ARTHROPLASTY Left   . TOTAL KNEE ARTHROPLASTY Right 06/06/2013   Procedure: RIGHT TOTAL KNEE ARTHROPLASTY;  Surgeon: Loanne Drilling, MD;  Location: [facility] ORS;  Service: Orthopedics;  Laterality: Right;    Current Medications: Current Meds  Medication Sig  . acetaminophen (TYLENOL) 650 MG CR tablet Take 650 mg by mouth 2 (two) times daily.  Marland Kitchen albuterol (VENTOLIN HFA) 108 (90 Base) MCG/ACT inhaler Inhale 2 puffs into the lungs every 4 (four) hours as needed (shortness of breath, if you can't catch your breath).  Marland Kitchen amoxicillin (AMOXIL) 500 MG tablet Take 1,000 mg by mouth as directed. Dental procedure  . apixaban (ELIQUIS) 5 MG TABS tablet Take 1 tablet (5 mg total) by mouth 2 (two) times daily.  Marland Kitchen atorvastatin (LIPITOR) 40 MG tablet TAKE 1 TABLET BY MOUTH  DAILY AT 6 PM. Please schedule appointment for future refills. Thank you (Patient taking  differently: TAKE  1 TABLET BY MOUTH  DAILY AT 6 PM. Please schedule appointment for future refills. Thank you)  . azithromycin (ZITHROMAX) 250 MG tablet Take 2 on day one then 1 daily x 4 days (Patient taking differently: as needed. Take 2 on day one then 1 daily x 4 days)  . budesonide (PULMICORT) 0.25 MG/2ML nebulizer solution One twice daily  . CALCIUM PO Take 1 tablet by mouth daily.  . Cholecalciferol (VITAMIN D3) 2000 UNITS TABS Take 2,000 Int'l Units by mouth daily.  . citalopram (CELEXA) 40 MG tablet TAKE 1 TABLET BY MOUTH IN  THE MORNING  . cyclobenzaprine (FLEXERIL) 5 MG tablet TAKE 1 TABLET BY MOUTH AT  BEDTIME AS NEEDED FOR  MUSCLE SPASM(S) (Patient taking differently: TAKE 1 TABLET BY MOUTH AT  BEDTIME AS NEEDED FOR  MUSCLE SPASM(S))  . diltiazem (CARDIZEM CD) 240 MG 24 hr capsule Take 1 capsule (240 mg total) by mouth daily.  . ferrous sulfate 325 (65 FE) MG tablet Take 1 tablet (325 mg total) by mouth daily with breakfast.  . formoterol (PERFOROMIST) 20 MCG/2ML nebulizer solution Take 2 mLs (20 mcg total) by nebulization 2 (two) times daily. Use in nebulizer twice daily perfectly regularly  . golimumab (SIMPONI ARIA) 50 MG/4ML SOLN injection Inject 50 mg into the vein every 8 (eight) weeks.   Marland Kitchen leflunomide (ARAVA) 20 MG tablet Take 1 tablet (20 mg total) by mouth daily.  Marland Kitchen levothyroxine (SYNTHROID) 125 MCG tablet TAKE 1 TABLET BY MOUTH EVERY DAY  . losartan (COZAAR) 50 MG tablet TAKE 1 TABLET BY MOUTH  DAILY  . montelukast (SINGULAIR) 10 MG tablet TAKE 1 TABLET BY MOUTH AT  BEDTIME  . OXYGEN Inhale 3 L into the lungs continuous. continuous o2  . pantoprazole (PROTONIX) 40 MG tablet TAKE 1 TABLET BY MOUTH  DAILY BEFORE BREAKFAST  . predniSONE (DELTASONE) 10 MG tablet Take by mouth.  . Tiotropium Bromide Monohydrate (SPIRIVA RESPIMAT) 2.5 MCG/ACT AERS USE 2 INHALATIONS BY MOUTH  EACH MORNING (Patient taking differently: USE 2 INHALATIONS BY MOUTH  EACH MORNING)  . torsemide (DEMADEX)  20 MG tablet Take 1 tablet (20 mg total) by mouth daily.  . traMADol (ULTRAM) 50 MG tablet Take 1 tablet (50 mg total) by mouth 2 (two) times daily.  Marland Kitchen triamcinolone cream (KENALOG) 0.1 % Apply 1 application topically 2 (two) times daily as needed (for psoriasis).   . vitamin B-12 (CYANOCOBALAMIN) 1000 MCG tablet Take 1,000 mcg by mouth daily.     Allergies:   Gabapentin   Social History   Socioeconomic History  . Marital status: Widowed    Spouse name: Not on file  . Number of children: Not on file  . Years of education: Not on file  . Highest education level: Not on file  Occupational History  . Occupation: retired    Associate Professor: Designer, jewellery  Tobacco Use  . Smoking status: Former Smoker    Packs/day: 1.50    Years: 58.50    Pack years: 87.75    Types: Cigarettes    Quit date: 03/03/2005    Years since quitting: 15.8  . Smokeless tobacco: Never Used  Vaping Use  . Vaping Use: Never used  Substance and Sexual Activity  . Alcohol use: No  . Drug use: No  . Sexual activity: Not on file  Other Topics Concern  . Not on file  Social History Narrative  . Not on file   Social Determinants of Health   Financial Resource Strain: Low Risk   .  Difficulty of Paying Living Expenses: Not hard at all  Food Insecurity: Not on file  Transportation Needs: No Transportation Needs  . Lack of Transportation (Medical): No  . Lack of Transportation (Non-Medical): No  Physical Activity: Not on file  Stress: Not on file  Social Connections: Not on file     Family History: The patient's family history includes Breast cancer in her mother; Coronary artery disease in her father; Rheum arthritis in her father.  ROS:   Please see the history of present illness.     All other systems reviewed and are negative.  EKGs/Labs/Other Studies Reviewed:    The following studies were reviewed today:  EKG:   01/07/21: SR rate 74 RAE  Cardiac Event Monitoring: Date:12/24/20 Results:  Patient  had a minimum heart rate of 65 bpm, maximum heart rate of 214 bpm, and average heart rate of 104 bpm.  Predominant underlying rhythm was sinus rhythm with 1st heart block.  One run of non-sustained ventricular tachycardia occurred lasting 6 beats at longest with a max rate of 214 bpm at fastest.  Isolated PACs were occasional (3.6%), with rare couplets and triplets present.  Isolated PVCs were rare (<1.0%), with rare couplets and trigeminy present.  Atrial Tachycardia vs Atrial Flutter occurred (37% burden), ranging from 65-198 bpm (avg of 133 bpm), the longest lasting 2 days 16 hours with an avg rate of 135 bpm.  Present at activation and de-activation of device.  Triggered and diary events associated with sinus rhythm, atrial tachycardia, atrial flutter, and PVCs.   Likely both atrial tachycardia and atrial flutter is present.   Transthoracic Echocardiogram: Date: 12/27/20 Results: 1. Left ventricular ejection fraction, by estimation, is 60 to 65%. The  left ventricle has normal function. The left ventricle has no regional  wall motion abnormalities. There is severe left ventricular hypertrophy.  Left ventricular diastolic parameters  are indeterminate.  2. Right ventricular systolic function is normal. The right ventricular  size is normal.  3. Left atrial size was moderately dilated.  4. The mitral valve is normal in structure. No evidence of mitral valve  regurgitation. No evidence of mitral stenosis. Moderate mitral annular  calcification.  5. The aortic valve is normal in structure. Aortic valve regurgitation is  not visualized. No aortic stenosis is present.  6. The inferior vena cava is dilated in size with <50% respiratory  variability, suggesting right atrial pressure of 15 mmHg.   Left/Right Heart Catheterizations: Date:06/01/2015 Results:  Prox LAD to Mid LAD lesion, 75% stenosed.  Prox Cx lesion, 95% stenosed.  Prox RCA to Mid RCA lesion, 90%  stenosed.  The left ventricular systolic function is normal.    Recent Labs: 01/18/2020: NT-Pro BNP 331 12/27/2020: ALT 24; B Natriuretic Peptide 329.1 12/28/2020: TSH 0.752 12/30/2020: Magnesium 2.1 12/31/2020: BUN 45; Creatinine, Ser 1.60; Hemoglobin 9.7; Platelets 182; Potassium 4.1; Sodium 138  Recent Lipid Panel    Component Value Date/Time   CHOL 164 01/18/2020 1110   CHOL 117 07/12/2015 1508   TRIG 117 01/18/2020 1110   TRIG 75 07/12/2015 1508   HDL 56 01/18/2020 1110   HDL 51 07/12/2015 1508   CHOLHDL 2.9 01/18/2020 1110   CHOLHDL 2.3 08/25/2018 2019   VLDL 22 08/25/2018 2019   LDLCALC 87 01/18/2020 1110   LDLCALC 51 07/12/2015 1508   LDLDIRECT 131.1 08/10/2012 0829     Risk Assessment/Calculations:    CHA2DS2-VASc Score = 7  This indicates a 11.2% annual risk of stroke. The patient's score is  based upon: CHF History: Yes HTN History: Yes Diabetes History: No Stroke History: Yes Vascular Disease History: Yes Age Score: 1 Gender Score: 1      Physical Exam:    VS:  BP 120/68   Pulse 74   Ht 4\' 11"  (1.499 m)   Wt 195 lb (88.5 kg)   SpO2 96% Comment: with oxygen  BMI 39.39 kg/m     Wt Readings from Last 3 Encounters:  01/07/21 195 lb (88.5 kg)  01/03/21 196 lb 3.2 oz (89 kg)  01/01/21 201 lb (91.2 kg)    GEN: Obese female well developed in no acute distress HEENT: Normal NECK: No JVD LYMPHATICS: No lymphadenopathy CARDIAC: RRR, no murmurs, rubs, gallops RESPIRATORY:  Good air movement and expiratory wheezes ABDOMEN: Soft, non-tender, non-distended MUSCULOSKELETAL:  No edema; No deformity  SKIN: Warm and dry NEUROLOGIC:  Alert and oriented x 3 PSYCHIATRIC:  Normal affect   ASSESSMENT:    1. Atrial flutter, unspecified type (HCC)   2. Acute heart failure with preserved ejection fraction (HCC)   3. Chronic heart failure with preserved ejection fraction (HCC)   4. Chronic obstructive pulmonary disease, unspecified COPD type (HCC)   5. Morbid  (severe) obesity due to excess calories (HCC)   6. S/P CABG x 3   7. Essential hypertension    PLAN:    Heart Failure Preserved Ejection Fraction  Morbid Obesity COPD HTN CKD IIIb - NYHA class II, Stage III, euvolemic, etiology multifactorial - Diuretic regimen: Torsemide 20 mg Po Daily;  - Discussed the importance of fluid restriction of < 2 L, salt restriction, and checking daily weights  - discussed diuretic kidney interactions - BMP/Mg   Paroxysmal Atrial Flutter - CHADSVASC=7. - Continue anticoagulation with Eliquis; at age 89 may meet decreased dose critereia - Continue rate control with diltiazem 240 mg  - Rhythm control options limited by pulmonary disease - Seeing EP - will get CBC  Coronary Artery Disease; Obstructive - asymptomatic - anatomy: CABGx3 (LIMA-LAD, SVG-OM1, SVG-RCA) ~ 2016 - on Elquis - continue statin, goal LDL < 70 (controlled at last eval)   Medication Adjustments/Labs and Tests Ordered: Current medicines are reviewed at length with the patient today.  Concerns regarding medicines are outlined above.  Orders Placed This Encounter  Procedures  . CBC  . Basic metabolic panel  . Magnesium  . EKG 12-Lead   No orders of the defined types were placed in this encounter.   Patient Instructions  Medication Instructions:  Your physician recommends that you continue on your current medications as directed. Please refer to the Current Medication list given to you today.  *If you need a refill on your cardiac medications before your next appointment, please call your pharmacy*   Lab Work: TODAY: CBC. BMP, and Mg If you have labs (blood work) drawn today and your tests are completely normal, you will receive your results only by: Marland Kitchen MyChart Message (if you have MyChart) OR . A paper copy in the mail If you have any lab test that is abnormal or we need to change your treatment, we will call you to review the  results.   Testing/Procedures: NONE   Follow-Up: At [facility], you and your health needs are our priority.  As part of our continuing mission to provide you with exceptional heart care, we have created designated Provider Care Teams.  These Care Teams include your primary Cardiologist (physician) and Advanced Practice Providers (APPs -  Physician Assistants and Nurse Practitioners) who  all work together to provide you with the care you need, when you need it.  We recommend signing up for the patient portal called "MyChart".  Sign up information is provided on this After Visit Summary.  MyChart is used to connect with patients for Virtual Visits (Telemedicine).  Patients are able to view lab/test results, encounter notes, upcoming appointments, etc.  Non-urgent messages can be sent to your provider as well.   To learn more about what you can do with MyChart, go to [email or web address].    Your next appointment:   6-9 month(s)  The format for your next appointment:   In Person  Provider:   You may see Riley Lam, MD or one of the following Advanced Practice Providers on your designated Care Team:    Ronie Spies, PA-C  Jacolyn Reedy, PA-C          Signed, Christell Constant, MD  01/07/2021 10:11 AM    [facility]

## 2021-01-07 NOTE — Telephone Encounter (Signed)
Crystal w/[facility] (formerly known as [facility]) is calling to let us know that they had to delayed the start of care to today due to staffing ability (short staffed).

## 2021-01-08 ENCOUNTER — Telehealth: Payer: Self-pay | Admitting: Internal Medicine

## 2021-01-08 NOTE — Telephone Encounter (Signed)
Verbal orders given to Gracee from [facility] for [facility] services.

## 2021-01-08 NOTE — Telephone Encounter (Signed)
Left message on machine for Gracee to return our call. 

## 2021-01-08 NOTE — Telephone Encounter (Signed)
Left message on machine for [name] to return our call. 

## 2021-01-08 NOTE — Telephone Encounter (Signed)
fyi

## 2021-01-08 NOTE — Telephone Encounter (Signed)
Ok to order as requested 

## 2021-01-08 NOTE — Telephone Encounter (Signed)
Gracee from [facility] is calling to request verbal orders.  Orders: approval for home health services - 1 time a week for 1 week, 2 times a week for 4 weeks, and 1 time a week for 4 weeks.  Please advise.

## 2021-01-10 ENCOUNTER — Other Ambulatory Visit: Payer: Self-pay

## 2021-01-10 ENCOUNTER — Telehealth: Payer: Self-pay | Admitting: Internal Medicine

## 2021-01-10 DIAGNOSIS — R5383 Other fatigue: Secondary | ICD-10-CM | POA: Diagnosis not present

## 2021-01-10 DIAGNOSIS — Z79899 Other long term (current) drug therapy: Secondary | ICD-10-CM | POA: Diagnosis not present

## 2021-01-10 DIAGNOSIS — Z111 Encounter for screening for respiratory tuberculosis: Secondary | ICD-10-CM | POA: Diagnosis not present

## 2021-01-10 DIAGNOSIS — M0589 Other rheumatoid arthritis with rheumatoid factor of multiple sites: Secondary | ICD-10-CM | POA: Diagnosis not present

## 2021-01-10 MED ORDER — MONTELUKAST SODIUM 10 MG PO TABS: 10.0000 mg | ORAL_TABLET | Freq: Every day | ORAL | 3 refills | Status: DC

## 2021-01-10 MED ORDER — PANTOPRAZOLE SODIUM 40 MG PO TBEC: 40.0000 mg | DELAYED_RELEASE_TABLET | Freq: Every day | ORAL | 3 refills | Status: DC

## 2021-01-10 MED ORDER — FERROUS SULFATE 325 (65 FE) MG PO TABS: 325.0000 mg | ORAL_TABLET | Freq: Every day | ORAL | 0 refills | Status: DC

## 2021-01-10 NOTE — Telephone Encounter (Signed)
LMTCB   This mail order pharmacy is already on her pharmacy profile.   Does she need refills. I see that refill was sent 09/2020. Will hold in triage until they call back.

## 2021-01-10 NOTE — Telephone Encounter (Signed)
Pt's daughter Ms. Leanne Chang is requesting a refill on ferrous sulfate 325 mg tablets. This medication was prescribed in the hospital. Would Dr. Izora Ribas like to refill this medication? Pt's daughter would like a call back at [phone], concerning this matter. Please address

## 2021-01-10 NOTE — Telephone Encounter (Signed)
Called and spoke to pt's daughter, [name] (on [place]). She states pt has changed mail order pharmacies and would like Singulair and Protonix sent to [facility]. New scripts sent to preferred pharmacy. [name] verbalized understanding and denied any further questions or concerns at this time.

## 2021-01-10 NOTE — Telephone Encounter (Signed)
Strictly speaking, we are managing her iron deficiency anemia and should not be the ones refilling it. That being said, the toxicity risk is low; and we would hate for her to have issues getting her medications while dealing with red tape. One time refill only; subsequent should be by Primary MD/DO.  Christell Constant, MD

## 2021-01-11 ENCOUNTER — Ambulatory Visit (INDEPENDENT_AMBULATORY_CARE_PROVIDER_SITE_OTHER): Payer: Medicare Other | Admitting: Internal Medicine

## 2021-01-11 VITALS — BP 126/74 | HR 79 | Temp 98.1°F | Resp 18 | Wt 195.0 lb

## 2021-01-11 DIAGNOSIS — I5033 Acute on chronic diastolic (congestive) heart failure: Secondary | ICD-10-CM | POA: Diagnosis not present

## 2021-01-11 DIAGNOSIS — Z8616 Personal history of COVID-19: Secondary | ICD-10-CM | POA: Diagnosis not present

## 2021-01-11 DIAGNOSIS — I480 Paroxysmal atrial fibrillation: Secondary | ICD-10-CM

## 2021-01-11 DIAGNOSIS — J9621 Acute and chronic respiratory failure with hypoxia: Secondary | ICD-10-CM

## 2021-01-11 DIAGNOSIS — Z09 Encounter for follow-up examination after completed treatment for conditions other than malignant neoplasm: Secondary | ICD-10-CM

## 2021-01-11 DIAGNOSIS — D509 Iron deficiency anemia, unspecified: Secondary | ICD-10-CM

## 2021-01-11 DIAGNOSIS — J441 Chronic obstructive pulmonary disease with (acute) exacerbation: Secondary | ICD-10-CM | POA: Diagnosis not present

## 2021-01-11 NOTE — Progress Notes (Signed)
Established Patient Office Visit     This visit occurred during the SARS-CoV-2 public health emergency.  Safety protocols were in place, including screening questions prior to the visit, additional usage of staff PPE, and extensive cleaning of exam room while observing appropriate contact time as indicated for disinfecting solutions.    CC/Reason for Visit: Hospital discharge follow-up  HPI: Siyah Mault is a 75 y.o. female who is coming in today for the above mentioned reasons.   She was hospitalized from February 24 through February 28.  Prior to that she had been diagnosed with COVID-19.  She had baseline has chronic hypoxemic respiratory failure on baseline oxygen.  Her daughter found her to be very weak and incoherent.  She was taken to the hospital where she was found to be in rapid a flutter with pulmonary edema and significant iron deficiency anemia.  Her lowest hemoglobin was 7.8, she did receive 1 unit of PRBCs.  Cardiology was involved she was initially on IV rate controlling medications but was transitioned back to her home regimen on discharge.  She was diuresed in the hospital with IV Lasix.  Her Lasix has been transitioned over to torsemide which she is now on.  She has a follow-up soon with her new cardiologist.  She already followed up with her pulmonologist, they are arranging a sleep study.  Her inhaler regimen for COPD has not changed.  She feels back to her baseline.  She is with her daughter today, her daughter confirms this.  Home health PT has already been out to visit her.  Past Medical/Surgical History: Past Medical History:  Diagnosis Date  . Arthritis    OA RIGHT KNEE WITH PAIN  . Barrett esophagus   . Bradycardia 06/01/2015  . CAD in native artery    a. NSTEMI 05/2015 s/p emergent CABG.  . Chronic diastolic (congestive) heart failure (HCC)   . Chronic kidney disease, stage 3a (HCC)   . Chronic respiratory failure (HCC)   . Chronic respiratory  failure with hypoxia and hypercapnia (HCC) 02/04/2010   Followed in Pulmonary clinic/ [facility]/ [facility]       - 02 dependent  since 07/02/10 >>  83% RA December 05, 2010       - ONO RA 08/05/12  :  Positive sat < 89 x 2:72m> repeat on 2lpm rec 08/12/2012  - 06/17/2013 reported desat with activity p Knee surgery > rec restart 2lpm with activity  - 06/27/2013   Walked 2lpm  x one lap @ 185 stopped due to sat 88% not sob , desat to 82% on RA just at th  . COPD III spirometry if use FEV1/VC p saba  07/18/2010   Quit smoking May 2006       - PFT's  04/12/10 FEV1  1.21 (69%) ratio 77 and no change p B2,  DLC0 56%   VC 70%         - PFTs  08/08/2013 FEV1 1.21 (60%) ratio 86 and no change p B2 DLCO 79%  VC 72%  On symbicort 160 2bid  - PFT's  02/08/2018  FEV1 0.70 (40 % ) ratio 56 if use FEV1/VC  p 38 % improvement from saba p symb 160 prior to study with DLCO  78 % corrects to 147  % for alv volume   - 02/08/2018  . Cough variant asthma 02/26/2011   Followed in Pulmonary clinic/ [facility]/ [facility]  - PFT's  06/04/15  FEV1 1.20 (  67 % ) ratio 83  p 6 % improvement from saba with DLCO  80 % corrects to 132 % for alv volume      - Clinical dx based on response to symbicort       FENO 09/16/2016  =   96 on symbicort 160 2bid > added singulair  Allergy profile 09/16/2016 >  Eos 0.5 /  IgE  78 neg RAST  -  Referred to rehab 04/29/2017 > completed  . Essential hypertension 04/20/2007   Qualifier: Diagnosis of  By: Marcelyn Ditty RN, Katy Fitch   . GERD (gastroesophageal reflux disease)   . History of ARDS 2006  . History of home oxygen therapy    AT NIGHT WHEN SLEEPING 2 L / MIN NASAL CANNULA  . Hyperlipidemia 07/12/2015  . Hypothyroidism   . Intracranial hemorrhage (HCC) 2019   a. small intracranial hemorrhage in setting of HTN.  . Mild carotid artery disease (HCC)    a. Duplex 1-39% bilaterally in 2016.  . Morbid (severe) obesity due to excess calories (HCC) 04/22/2015   pfts with erv 14% 06/04/15  And 33% 02/08/2018   . NSTEMI  (non-ST elevated myocardial infarction) (HCC) 05/31/2015  . Pneumococcal pneumonia (HCC) 2006   HOSPITALIZED AND DEVELOPED ARDS  . Psoriatic arthritis (HCC)   . PULMONARY FIBROSIS ILD POST INFLAMMATORY CHRONIC 07/18/2010   Followed as Primary Care Patient/ [facility]/ [facility]  -s/p ARDS 2006 with bacteremic S  Pna       - CT chest 07/03/10 Nonspecific PF mostly upper lobes       - CT chest 12/03/10 acute gg changes and effusions c/w chf - PFT's  02/08/2018  FVC 0.64 (28 %)   with DLCO  78 % corrects to 147 % for alv volume     . Rheumatic disease   . 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 SOB (shortness of breath) on exertion     Past Surgical History:  Procedure Laterality Date  . ABDOMINOPLASTY    . CARDIAC CATHETERIZATION N/A 06/01/2015   Procedure: Left Heart Cath and Coronary Angiography;  Surgeon: Runell Gess, MD;  Location: [facility] INVASIVE CV LAB;  Service: Cardiovascular;  Laterality: N/A;  . CARPAL TUNNEL RELEASE    . CHOLECYSTECTOMY    . CORONARY ARTERY BYPASS GRAFT N/A 06/04/2015   Procedure: CORONARY ARTERY BYPASS GRAFT times three            with left internal mammary artery and right leg saphenous vein;  Surgeon: Alleen Borne, MD;  Location: [facility] OR;  Service: Open Heart Surgery;  Laterality: N/A;  . cosmetic breast surgery    . JOINT REPLACEMENT    . KNEE ARTHROSCOPY Left   . TEE WITHOUT CARDIOVERSION  06/04/2015   Procedure: TRANSESOPHAGEAL ECHOCARDIOGRAM (TEE);  Surgeon: Alleen Borne, MD;  Location: [facility] OR;  Service: Open Heart Surgery;;  . TOTAL KNEE ARTHROPLASTY Left   . TOTAL KNEE ARTHROPLASTY Right 06/06/2013   Procedure: RIGHT TOTAL KNEE ARTHROPLASTY;  Surgeon: Loanne Drilling, MD;  Location: [facility] ORS;  Service: Orthopedics;  Laterality: Right;    Social History:  reports that she quit smoking about 15 years ago. Her smoking use included cigarettes. She has a 87.75 pack-year smoking history. She has never used smokeless tobacco. She reports that she does not drink alcohol and does not  use drugs.  Allergies: Allergies  Allergen Reactions  . Gabapentin Other (See Comments)    Dizziness, lighthead    Family History:  Family  History  Problem Relation Age of Onset  . Breast cancer Mother   . Coronary artery disease Father   . Rheum arthritis Father      Current Outpatient Medications:  .  acetaminophen (TYLENOL) 650 MG CR tablet, Take 650 mg by mouth 2 (two) times daily., Disp: , Rfl:  .  albuterol (VENTOLIN HFA) 108 (90 Base) MCG/ACT inhaler, Inhale 2 puffs into the lungs every 4 (four) hours as needed (shortness of breath, if you can't catch your breath)., Disp: 8 g, Rfl: 5 .  amoxicillin (AMOXIL) 500 MG tablet, Take 1,000 mg by mouth as directed. Dental procedure, Disp: , Rfl:  .  apixaban (ELIQUIS) 5 MG TABS tablet, Take 1 tablet (5 mg total) by mouth 2 (two) times daily., Disp: 60 tablet, Rfl: 3 .  atorvastatin (LIPITOR) 40 MG tablet, TAKE 1 TABLET BY MOUTH  DAILY AT 6 PM. Please schedule appointment for future refills. Thank you (Patient taking differently: TAKE 1 TABLET BY MOUTH  DAILY AT 6 PM. Please schedule appointment for future refills. Thank you), Disp: 90 tablet, Rfl: 0 .  azithromycin (ZITHROMAX) 250 MG tablet, Take 2 on day one then 1 daily x 4 days (Patient taking differently: as needed. Take 2 on day one then 1 daily x 4 days), Disp: 6 tablet, Rfl: 11 .  budesonide (PULMICORT) 0.25 MG/2ML nebulizer solution, One twice daily, Disp: 120 mL, Rfl: 12 .  CALCIUM PO, Take 1 tablet by mouth daily., Disp: , Rfl:  .  Cholecalciferol (VITAMIN D3) 2000 UNITS TABS, Take 2,000 Int'l Units by mouth daily., Disp: , Rfl:  .  citalopram (CELEXA) 40 MG tablet, TAKE 1 TABLET BY MOUTH IN  THE MORNING, Disp: 90 tablet, Rfl: 3 .  cyclobenzaprine (FLEXERIL) 5 MG tablet, TAKE 1 TABLET BY MOUTH AT  BEDTIME AS NEEDED FOR  MUSCLE SPASM(S) (Patient taking differently: TAKE 1 TABLET BY MOUTH AT  BEDTIME AS NEEDED FOR  MUSCLE SPASM(S)), Disp: 90 tablet, Rfl: 0 .  diltiazem (CARDIZEM  CD) 240 MG 24 hr capsule, Take 1 capsule (240 mg total) by mouth daily., Disp: 90 capsule, Rfl: 1 .  ferrous sulfate 325 (65 FE) MG tablet, Take 1 tablet (325 mg total) by mouth daily with breakfast., Disp: 30 tablet, Rfl: 0 .  formoterol (PERFOROMIST) 20 MCG/2ML nebulizer solution, Take 2 mLs (20 mcg total) by nebulization 2 (two) times daily. Use in nebulizer twice daily perfectly regularly, Disp: 120 mL, Rfl: 11 .  golimumab (SIMPONI ARIA) 50 MG/4ML SOLN injection, Inject 50 mg into the vein every 8 (eight) weeks. , Disp: , Rfl:  .  leflunomide (ARAVA) 20 MG tablet, Take 1 tablet (20 mg total) by mouth daily., Disp: , Rfl:  .  levothyroxine (SYNTHROID) 125 MCG tablet, TAKE 1 TABLET BY MOUTH EVERY DAY, Disp: 90 tablet, Rfl: 1 .  losartan (COZAAR) 50 MG tablet, TAKE 1 TABLET BY MOUTH  DAILY, Disp: 90 tablet, Rfl: 3 .  montelukast (SINGULAIR) 10 MG tablet, Take 1 tablet (10 mg total) by mouth at bedtime., Disp: 90 tablet, Rfl: 3 .  OXYGEN, Inhale 3 L into the lungs continuous. continuous o2, Disp: , Rfl:  .  pantoprazole (PROTONIX) 40 MG tablet, Take 1 tablet (40 mg total) by mouth daily before breakfast., Disp: 90 tablet, Rfl: 3 .  predniSONE (DELTASONE) 10 MG tablet, Take by mouth., Disp: , Rfl:  .  Tiotropium Bromide Monohydrate (SPIRIVA RESPIMAT) 2.5 MCG/ACT AERS, USE 2 INHALATIONS BY MOUTH  EACH MORNING (Patient taking differently: USE  2 INHALATIONS BY MOUTH  EACH MORNING), Disp: 12 g, Rfl: 4 .  torsemide (DEMADEX) 20 MG tablet, Take 1 tablet (20 mg total) by mouth daily., Disp: 30 tablet, Rfl: 0 .  traMADol (ULTRAM) 50 MG tablet, Take 1 tablet (50 mg total) by mouth 2 (two) times daily., Disp: 60 tablet, Rfl: 1 .  triamcinolone cream (KENALOG) 0.1 %, Apply 1 application topically 2 (two) times daily as needed (for psoriasis). , Disp: , Rfl:  .  vitamin B-12 (CYANOCOBALAMIN) 1000 MCG tablet, Take 1,000 mcg by mouth daily., Disp: , Rfl:   Review of Systems:  Constitutional: Denies fever,  chills, diaphoresis, appetite change and fatigue.  HEENT: Denies photophobia, eye pain, redness, hearing loss, ear pain, congestion, sore throat, rhinorrhea, sneezing, mouth sores, trouble swallowing, neck pain, neck stiffness and tinnitus.   Respiratory: Denies SOB, DOE, cough, chest tightness,  and wheezing above her baseline. Cardiovascular: Denies chest pain, palpitations and leg swelling.  Gastrointestinal: Denies nausea, vomiting, abdominal pain, diarrhea, constipation, blood in stool and abdominal distention.  Genitourinary: Denies dysuria, urgency, frequency, hematuria, flank pain and difficulty urinating.  Endocrine: Denies: hot or cold intolerance, sweats, changes in hair or nails, polyuria, polydipsia. Musculoskeletal: Denies myalgias, back pain, joint swelling, arthralgias and gait problem.  Skin: Denies pallor, rash and wound.  Neurological: Denies dizziness, seizures, syncope,light-headedness, numbness and headaches.  Hematological: Denies adenopathy. Easy bruising, personal or family bleeding history  Psychiatric/Behavioral: Denies suicidal ideation, mood changes, confusion, nervousness, sleep disturbance and agitation    Physical Exam: Vitals:   01/11/21 1543  BP: 126/74  Pulse: 79  Resp: 18  Temp: 98.1 F (36.7 C)  TempSrc: Oral  SpO2: 96%  Weight: 195 lb (88.5 kg)    Body mass index is 39.39 kg/m.   Constitutional: NAD, calm, comfortable, obese, wearing oxygen Eyes: PERRL, lids and conjunctivae normal, wears corrective lenses ENMT: Mucous membranes are moist. Respiratory: clear to auscultation bilaterally, no wheezing, no crackles. Normal respiratory effort. No accessory muscle use.  Cardiovascular: Regular rate and rhythm, no murmurs / rubs / gallops. No extremity edema.  Neurologic: Grossly intact and nonfocal Psychiatric: Normal judgment and insight. Alert and oriented x 3. Normal mood.    Impression and Plan:  Hospital discharge follow-up Paroxysmal  atrial fibrillation (HCC) History of COVID-19 COPD with acute exacerbation (HCC) Acute on chronic respiratory failure with hypoxia (HCC) Acute on chronic diastolic heart failure (HCC) Iron deficiency anemia, unspecified iron deficiency anemia type  [facility] charts have been reviewed in detail.  Medication reconciliation has been made.  Patient and daughter understand changes to medication regimen.  She is taking her iron and torsemide which are truly her only new ones.  She has already seen her pulmonologist.  She has appointment upcoming with new cardiologist as her previous one has left the area.  Sleep study is in the process of being arranged by pulmonary, daughter will follow up on this.  I will plan on seeing her back in 3 months, at that time we will recheck her iron levels.  She knows to contact me if she has any further issues between now and then.  Time spent: 35 minutes reviewing chart, examining patient and discussing plan of care with patient and her daughter.     Chaya Jan, MD [facility]

## 2021-01-15 DIAGNOSIS — M8580 Other specified disorders of bone density and structure, unspecified site: Secondary | ICD-10-CM | POA: Diagnosis not present

## 2021-01-15 DIAGNOSIS — M0589 Other rheumatoid arthritis with rheumatoid factor of multiple sites: Secondary | ICD-10-CM | POA: Diagnosis not present

## 2021-01-15 DIAGNOSIS — M5136 Other intervertebral disc degeneration, lumbar region: Secondary | ICD-10-CM | POA: Diagnosis not present

## 2021-01-15 DIAGNOSIS — I252 Old myocardial infarction: Secondary | ICD-10-CM | POA: Diagnosis not present

## 2021-01-15 DIAGNOSIS — I130 Hypertensive heart and chronic kidney disease with heart failure and stage 1 through stage 4 chronic kidney disease, or unspecified chronic kidney disease: Secondary | ICD-10-CM | POA: Diagnosis not present

## 2021-01-15 DIAGNOSIS — M150 Primary generalized (osteo)arthritis: Secondary | ICD-10-CM | POA: Diagnosis not present

## 2021-01-15 DIAGNOSIS — Z111 Encounter for screening for respiratory tuberculosis: Secondary | ICD-10-CM | POA: Diagnosis not present

## 2021-01-15 DIAGNOSIS — Z79899 Other long term (current) drug therapy: Secondary | ICD-10-CM | POA: Diagnosis not present

## 2021-01-15 DIAGNOSIS — L409 Psoriasis, unspecified: Secondary | ICD-10-CM | POA: Diagnosis not present

## 2021-01-15 DIAGNOSIS — Z6841 Body Mass Index (BMI) 40.0 and over, adult: Secondary | ICD-10-CM | POA: Diagnosis not present

## 2021-01-15 DIAGNOSIS — I501 Left ventricular failure: Secondary | ICD-10-CM | POA: Diagnosis not present

## 2021-01-15 DIAGNOSIS — J441 Chronic obstructive pulmonary disease with (acute) exacerbation: Secondary | ICD-10-CM | POA: Diagnosis not present

## 2021-01-15 DIAGNOSIS — R5383 Other fatigue: Secondary | ICD-10-CM | POA: Diagnosis not present

## 2021-01-15 DIAGNOSIS — I5033 Acute on chronic diastolic (congestive) heart failure: Secondary | ICD-10-CM | POA: Diagnosis not present

## 2021-01-15 DIAGNOSIS — N1831 Chronic kidney disease, stage 3a: Secondary | ICD-10-CM | POA: Diagnosis not present

## 2021-01-15 DIAGNOSIS — D631 Anemia in chronic kidney disease: Secondary | ICD-10-CM | POA: Diagnosis not present

## 2021-01-17 ENCOUNTER — Other Ambulatory Visit: Payer: Self-pay

## 2021-01-17 DIAGNOSIS — I130 Hypertensive heart and chronic kidney disease with heart failure and stage 1 through stage 4 chronic kidney disease, or unspecified chronic kidney disease: Secondary | ICD-10-CM | POA: Diagnosis not present

## 2021-01-17 DIAGNOSIS — N1831 Chronic kidney disease, stage 3a: Secondary | ICD-10-CM | POA: Diagnosis not present

## 2021-01-17 DIAGNOSIS — I10 Essential (primary) hypertension: Secondary | ICD-10-CM

## 2021-01-17 DIAGNOSIS — J441 Chronic obstructive pulmonary disease with (acute) exacerbation: Secondary | ICD-10-CM | POA: Diagnosis not present

## 2021-01-17 DIAGNOSIS — Z951 Presence of aortocoronary bypass graft: Secondary | ICD-10-CM

## 2021-01-17 DIAGNOSIS — D631 Anemia in chronic kidney disease: Secondary | ICD-10-CM | POA: Diagnosis not present

## 2021-01-17 DIAGNOSIS — E782 Mixed hyperlipidemia: Secondary | ICD-10-CM

## 2021-01-17 DIAGNOSIS — R197 Diarrhea, unspecified: Secondary | ICD-10-CM

## 2021-01-17 DIAGNOSIS — K6289 Other specified diseases of anus and rectum: Secondary | ICD-10-CM

## 2021-01-17 DIAGNOSIS — I5033 Acute on chronic diastolic (congestive) heart failure: Secondary | ICD-10-CM | POA: Diagnosis not present

## 2021-01-17 DIAGNOSIS — I501 Left ventricular failure: Secondary | ICD-10-CM | POA: Diagnosis not present

## 2021-01-17 DIAGNOSIS — R198 Other specified symptoms and signs involving the digestive system and abdomen: Secondary | ICD-10-CM

## 2021-01-17 MED ORDER — LOSARTAN POTASSIUM 50 MG PO TABS: 50.0000 mg | ORAL_TABLET | Freq: Every day | ORAL | 3 refills | Status: DC

## 2021-01-22 DIAGNOSIS — N1831 Chronic kidney disease, stage 3a: Secondary | ICD-10-CM | POA: Diagnosis not present

## 2021-01-22 DIAGNOSIS — D631 Anemia in chronic kidney disease: Secondary | ICD-10-CM | POA: Diagnosis not present

## 2021-01-22 DIAGNOSIS — I5033 Acute on chronic diastolic (congestive) heart failure: Secondary | ICD-10-CM | POA: Diagnosis not present

## 2021-01-22 DIAGNOSIS — J441 Chronic obstructive pulmonary disease with (acute) exacerbation: Secondary | ICD-10-CM | POA: Diagnosis not present

## 2021-01-22 DIAGNOSIS — I130 Hypertensive heart and chronic kidney disease with heart failure and stage 1 through stage 4 chronic kidney disease, or unspecified chronic kidney disease: Secondary | ICD-10-CM | POA: Diagnosis not present

## 2021-01-22 DIAGNOSIS — I501 Left ventricular failure: Secondary | ICD-10-CM | POA: Diagnosis not present

## 2021-01-23 DIAGNOSIS — N1831 Chronic kidney disease, stage 3a: Secondary | ICD-10-CM | POA: Diagnosis not present

## 2021-01-23 DIAGNOSIS — I501 Left ventricular failure: Secondary | ICD-10-CM | POA: Diagnosis not present

## 2021-01-23 DIAGNOSIS — M545 Low back pain, unspecified: Secondary | ICD-10-CM | POA: Diagnosis not present

## 2021-01-23 DIAGNOSIS — I130 Hypertensive heart and chronic kidney disease with heart failure and stage 1 through stage 4 chronic kidney disease, or unspecified chronic kidney disease: Secondary | ICD-10-CM | POA: Diagnosis not present

## 2021-01-23 DIAGNOSIS — I5033 Acute on chronic diastolic (congestive) heart failure: Secondary | ICD-10-CM | POA: Diagnosis not present

## 2021-01-23 DIAGNOSIS — J441 Chronic obstructive pulmonary disease with (acute) exacerbation: Secondary | ICD-10-CM | POA: Diagnosis not present

## 2021-01-23 DIAGNOSIS — M5459 Other low back pain: Secondary | ICD-10-CM | POA: Diagnosis not present

## 2021-01-23 DIAGNOSIS — D631 Anemia in chronic kidney disease: Secondary | ICD-10-CM | POA: Diagnosis not present

## 2021-01-24 DIAGNOSIS — I501 Left ventricular failure: Secondary | ICD-10-CM | POA: Diagnosis not present

## 2021-01-24 DIAGNOSIS — D631 Anemia in chronic kidney disease: Secondary | ICD-10-CM | POA: Diagnosis not present

## 2021-01-24 DIAGNOSIS — I130 Hypertensive heart and chronic kidney disease with heart failure and stage 1 through stage 4 chronic kidney disease, or unspecified chronic kidney disease: Secondary | ICD-10-CM | POA: Diagnosis not present

## 2021-01-24 DIAGNOSIS — I5033 Acute on chronic diastolic (congestive) heart failure: Secondary | ICD-10-CM | POA: Diagnosis not present

## 2021-01-24 DIAGNOSIS — J441 Chronic obstructive pulmonary disease with (acute) exacerbation: Secondary | ICD-10-CM | POA: Diagnosis not present

## 2021-01-24 DIAGNOSIS — N1831 Chronic kidney disease, stage 3a: Secondary | ICD-10-CM | POA: Diagnosis not present

## 2021-01-28 ENCOUNTER — Telehealth: Payer: Self-pay

## 2021-01-28 MED ORDER — APIXABAN 5 MG PO TABS: 5.0000 mg | ORAL_TABLET | Freq: Two times a day (BID) | ORAL | 1 refills | Status: DC

## 2021-01-28 NOTE — Telephone Encounter (Signed)
Pt is needing a refill sent to [facility] for Eliquis and pt is also requesting a refill on torsemide. This medication was prescribed in the hospital. Would Dr. Izora Ribas like to refill this medication? Pt daughter would like this sent to local pharmacy for a 30 day supply and to [facility] mail order pharmacy as well, for 90 day supply. Please address

## 2021-01-28 NOTE — Telephone Encounter (Signed)
Pt last saw Dr Izora Ribas on 01/07/21, last labs 01/07/21 Creat 1.02, age 75, weight 88.5kg, based on specified criteria pt is on appropriate dosage of Eliquis 5mg  BID.  Will refill rx.

## 2021-01-29 ENCOUNTER — Other Ambulatory Visit: Payer: Self-pay

## 2021-01-29 DIAGNOSIS — D631 Anemia in chronic kidney disease: Secondary | ICD-10-CM | POA: Diagnosis not present

## 2021-01-29 DIAGNOSIS — I5033 Acute on chronic diastolic (congestive) heart failure: Secondary | ICD-10-CM | POA: Diagnosis not present

## 2021-01-29 DIAGNOSIS — I130 Hypertensive heart and chronic kidney disease with heart failure and stage 1 through stage 4 chronic kidney disease, or unspecified chronic kidney disease: Secondary | ICD-10-CM | POA: Diagnosis not present

## 2021-01-29 DIAGNOSIS — I501 Left ventricular failure: Secondary | ICD-10-CM | POA: Diagnosis not present

## 2021-01-29 DIAGNOSIS — N1831 Chronic kidney disease, stage 3a: Secondary | ICD-10-CM | POA: Diagnosis not present

## 2021-01-29 DIAGNOSIS — J441 Chronic obstructive pulmonary disease with (acute) exacerbation: Secondary | ICD-10-CM | POA: Diagnosis not present

## 2021-01-29 MED ORDER — TORSEMIDE 20 MG PO TABS
20.0000 mg | ORAL_TABLET | Freq: Every day | ORAL | 3 refills | Status: DC
Start: 2021-01-29 — End: 2021-06-06

## 2021-01-29 MED ORDER — TORSEMIDE 20 MG PO TABS: 20.0000 mg | ORAL_TABLET | Freq: Every day | ORAL | 0 refills | Status: DC

## 2021-01-29 MED ORDER — TORSEMIDE 20 MG PO TABS
20.0000 mg | ORAL_TABLET | Freq: Every day | ORAL | 11 refills | Status: DC
Start: 2021-01-29 — End: 2021-01-29

## 2021-01-29 NOTE — Addendum Note (Signed)
Addended by: Margaret Pyle D on: 01/29/2021 08:56 AM   Modules accepted: Orders

## 2021-01-29 NOTE — Telephone Encounter (Signed)
Pt's medication was sent to local pharmacy for a 30 day supply and to [facility] mail order pharmacy for the year supply as requested. Confirmation received.

## 2021-01-30 DIAGNOSIS — L304 Erythema intertrigo: Secondary | ICD-10-CM | POA: Diagnosis not present

## 2021-01-30 DIAGNOSIS — L218 Other seborrheic dermatitis: Secondary | ICD-10-CM | POA: Diagnosis not present

## 2021-01-30 DIAGNOSIS — L400 Psoriasis vulgaris: Secondary | ICD-10-CM | POA: Diagnosis not present

## 2021-01-30 DIAGNOSIS — L720 Epidermal cyst: Secondary | ICD-10-CM | POA: Diagnosis not present

## 2021-01-30 DIAGNOSIS — L821 Other seborrheic keratosis: Secondary | ICD-10-CM | POA: Diagnosis not present

## 2021-01-31 ENCOUNTER — Telehealth: Payer: Self-pay

## 2021-01-31 DIAGNOSIS — D631 Anemia in chronic kidney disease: Secondary | ICD-10-CM | POA: Diagnosis not present

## 2021-01-31 DIAGNOSIS — I5033 Acute on chronic diastolic (congestive) heart failure: Secondary | ICD-10-CM | POA: Diagnosis not present

## 2021-01-31 DIAGNOSIS — N1831 Chronic kidney disease, stage 3a: Secondary | ICD-10-CM | POA: Diagnosis not present

## 2021-01-31 DIAGNOSIS — I130 Hypertensive heart and chronic kidney disease with heart failure and stage 1 through stage 4 chronic kidney disease, or unspecified chronic kidney disease: Secondary | ICD-10-CM | POA: Diagnosis not present

## 2021-01-31 DIAGNOSIS — J441 Chronic obstructive pulmonary disease with (acute) exacerbation: Secondary | ICD-10-CM | POA: Diagnosis not present

## 2021-01-31 DIAGNOSIS — I501 Left ventricular failure: Secondary | ICD-10-CM | POA: Diagnosis not present

## 2021-01-31 NOTE — Telephone Encounter (Signed)
Pt's daughter calling stating that pt's medication Eliquis is too expensive for pt to get. I gave them the number to [facility] squib[employer] pt assistance foundation and told them that I would leave the pt 2 weeks of samples of Eliquis 5 mg tablet and for them to fill out the application and return it back to the office. Lot: [id number] Exp: 03/2023  FYI

## 2021-02-01 NOTE — Telephone Encounter (Signed)
**Note De-Identified  Obfuscation** No answer at phone number provided for Cala Bradford (the pts daughter and DPR) so I left a message on her VM asking her that when she calls [employer] (for asst with the pts Eliquis) to request that they mail her an application and that once she recieves it to complete the pt part, obtain required documents per [employer], and to bring all to Dr Debby Bud office to drip off and that wil we handle the provider part of the application and will fax all to [employer].  I did leave my name and the office phone number in the VM and asked that she call me if she has any questions or concerns.

## 2021-02-04 ENCOUNTER — Other Ambulatory Visit: Payer: Self-pay | Admitting: Cardiology

## 2021-02-04 ENCOUNTER — Other Ambulatory Visit (HOSPITAL_COMMUNITY): Payer: Self-pay | Admitting: Physician Assistant

## 2021-02-04 ENCOUNTER — Telehealth: Payer: Self-pay | Admitting: Cardiology

## 2021-02-04 DIAGNOSIS — I130 Hypertensive heart and chronic kidney disease with heart failure and stage 1 through stage 4 chronic kidney disease, or unspecified chronic kidney disease: Secondary | ICD-10-CM | POA: Diagnosis not present

## 2021-02-04 DIAGNOSIS — R7989 Other specified abnormal findings of blood chemistry: Secondary | ICD-10-CM

## 2021-02-04 DIAGNOSIS — D631 Anemia in chronic kidney disease: Secondary | ICD-10-CM | POA: Diagnosis not present

## 2021-02-04 DIAGNOSIS — J441 Chronic obstructive pulmonary disease with (acute) exacerbation: Secondary | ICD-10-CM | POA: Diagnosis not present

## 2021-02-04 DIAGNOSIS — I501 Left ventricular failure: Secondary | ICD-10-CM | POA: Diagnosis not present

## 2021-02-04 DIAGNOSIS — I5033 Acute on chronic diastolic (congestive) heart failure: Secondary | ICD-10-CM | POA: Diagnosis not present

## 2021-02-04 DIAGNOSIS — E032 Hypothyroidism due to medicaments and other exogenous substances: Secondary | ICD-10-CM

## 2021-02-04 DIAGNOSIS — N1831 Chronic kidney disease, stage 3a: Secondary | ICD-10-CM | POA: Diagnosis not present

## 2021-02-04 NOTE — Telephone Encounter (Signed)
Discussed with patient and daughter. On and off shortness of breath over the weekend - decided to check vital signs today and initially BP 133/67 HR 93-110.  Once taking her medications (including diltiazem) her HR is now in the 70s. Will bring in for assessment tomorrow per request. Pt/daughter will call if issues arise prior to appt tomorrow.

## 2021-02-04 NOTE — Telephone Encounter (Signed)
Pt c/o Shortness Of Breath: STAT if SOB developed within the last 24 hours or pt is noticeably SOB on the phone  1. Are you currently SOB (can you hear that pt is SOB on the phone)? No   2. How long have you been experiencing SOB? Day before yesterday   3. Are you SOB when sitting or when up moving around? Mostly moving around, but had some yesterday while sitting comes and goes   4. Are you currently experiencing any other symptoms? No    Pt c/o medication issue:  1. Name of Medication: diltiazem (CARDIZEM CD) 240 MG 24 hr capsule  2. How are you currently taking this medication (dosage and times per day)? As prescribed   3. Are you having a reaction (difficulty breathing--STAT)? Possibly   4. What is your medication issue? Cala Bradford is calling stating [place] started complaining of SOB on 02/02/21. [place] reports it comes and goes, but mostly occurs when she is moving around. Cala Bradford is wondering if this could be due to the decrease made in this medication when she was released from the hospital. She also reports she has been taking Wells's BP this morning and she is out of rhythm again. Please advise.

## 2021-02-04 NOTE — Telephone Encounter (Signed)
I will send to a-fib clinic per message the pt is out of rhythm again.

## 2021-02-05 ENCOUNTER — Ambulatory Visit (HOSPITAL_COMMUNITY)
Admission: RE | Admit: 2021-02-05 | Discharge: 2021-02-05 | Disposition: A | Discharge status: Home / Self Care | Payer: Medicare Other | Source: Ambulatory Visit | Attending: Physician Assistant | Admitting: Physician Assistant

## 2021-02-05 ENCOUNTER — Other Ambulatory Visit: Payer: Self-pay

## 2021-02-05 ENCOUNTER — Encounter (HOSPITAL_COMMUNITY): Payer: Self-pay | Admitting: Physician Assistant

## 2021-02-05 VITALS — BP 132/72 | HR 95 | Ht 59.0 in | Wt 207.0 lb

## 2021-02-05 DIAGNOSIS — Z713 Dietary counseling and surveillance: Secondary | ICD-10-CM | POA: Diagnosis not present

## 2021-02-05 DIAGNOSIS — Z8249 Family history of ischemic heart disease and other diseases of the circulatory system: Secondary | ICD-10-CM | POA: Insufficient documentation

## 2021-02-05 DIAGNOSIS — Z6841 Body Mass Index (BMI) 40.0 and over, adult: Secondary | ICD-10-CM | POA: Diagnosis not present

## 2021-02-05 DIAGNOSIS — I5032 Chronic diastolic (congestive) heart failure: Secondary | ICD-10-CM | POA: Insufficient documentation

## 2021-02-05 DIAGNOSIS — D6869 Other thrombophilia: Secondary | ICD-10-CM | POA: Diagnosis not present

## 2021-02-05 DIAGNOSIS — Z7901 Long term (current) use of anticoagulants: Secondary | ICD-10-CM | POA: Diagnosis not present

## 2021-02-05 DIAGNOSIS — Z79899 Other long term (current) drug therapy: Secondary | ICD-10-CM | POA: Diagnosis not present

## 2021-02-05 DIAGNOSIS — Z87891 Personal history of nicotine dependence: Secondary | ICD-10-CM | POA: Insufficient documentation

## 2021-02-05 DIAGNOSIS — Z951 Presence of aortocoronary bypass graft: Secondary | ICD-10-CM | POA: Diagnosis not present

## 2021-02-05 DIAGNOSIS — E669 Obesity, unspecified: Secondary | ICD-10-CM | POA: Diagnosis not present

## 2021-02-05 DIAGNOSIS — Z889 Allergy status to unspecified drugs, medicaments and biological substances status: Secondary | ICD-10-CM | POA: Diagnosis not present

## 2021-02-05 DIAGNOSIS — I4819 Other persistent atrial fibrillation: Secondary | ICD-10-CM | POA: Insufficient documentation

## 2021-02-05 DIAGNOSIS — I110 Hypertensive heart disease with heart failure: Secondary | ICD-10-CM | POA: Insufficient documentation

## 2021-02-05 DIAGNOSIS — I4892 Unspecified atrial flutter: Secondary | ICD-10-CM | POA: Insufficient documentation

## 2021-02-05 DIAGNOSIS — R0602 Shortness of breath: Secondary | ICD-10-CM | POA: Diagnosis not present

## 2021-02-05 DIAGNOSIS — I2510 Atherosclerotic heart disease of native coronary artery without angina pectoris: Secondary | ICD-10-CM | POA: Diagnosis not present

## 2021-02-05 LAB — BASIC METABOLIC PANEL
Anion gap: 6 (ref 5–15)
BUN: 36 mg/dL — ABNORMAL HIGH (ref 8–23)
CO2: 35 mmol/L — ABNORMAL HIGH (ref 22–32)
Calcium: 9.6 mg/dL (ref 8.9–10.3)
Chloride: 98 mmol/L (ref 98–111)
Creatinine, Ser: 1.15 mg/dL — ABNORMAL HIGH (ref 0.44–1.00)
GFR, Estimated: 50 mL/min — ABNORMAL LOW (ref >60)
Glucose, Bld: 115 mg/dL — ABNORMAL HIGH (ref 70–99)
Potassium: 4.6 mmol/L (ref 3.5–5.1)
Sodium: 139 mmol/L (ref 135–145)

## 2021-02-05 LAB — CBC
HCT: 39 % (ref 36.0–46.0)
Hemoglobin: 11.3 g/dL — ABNORMAL LOW (ref 12.0–15.0)
MCH: 28.8 pg (ref 26.0–34.0)
MCHC: 29 g/dL — ABNORMAL LOW (ref 30.0–36.0)
MCV: 99.5 fL (ref 80.0–100.0)
Platelets: 242 10*3/uL (ref 150–400)
RBC: 3.92 MIL/uL (ref 3.87–5.11)
RDW: 16.2 % — ABNORMAL HIGH (ref 11.5–15.5)
WBC: 10.8 10*3/uL — ABNORMAL HIGH (ref 4.0–10.5)
nRBC: 0 % (ref 0.0–0.2)

## 2021-02-05 LAB — MAGNESIUM: Magnesium: 2.4 mg/dL (ref 1.7–2.4)

## 2021-02-05 NOTE — Patient Instructions (Signed)
Cardioversion scheduled for Tuesday, April 12th  - Arrive at the [facility] and go to admitting at 1030AM  - Do not eat or drink anything after midnight the night prior to your procedure.  - Take all your morning medication (except diabetic medications) with a sip of water prior to arrival.  - You will not be able to drive home after your procedure.  - Do NOT miss any doses of your blood thinner - if you should miss a dose please notify our office immediately.  - If you feel as if you go back into normal rhythm prior to scheduled cardioversion, please notify our office immediately. If your procedure is canceled in the cardioversion suite you will be charged a cancellation fee.     Increase toresmide to 40mg  a day for 3 days then reduce back to 20mg  a day

## 2021-02-05 NOTE — H&P (View-Only) (Signed)
Primary Care Physician: [name], [name], MD Primary Cardiologist: Dr Izora Ribas Primary Electrophysiologist: none Referring Physician: Dr Herminio Commons [name] is a 75 y.o. female with a history of CAD s/p NSTEMI and CABG, COPD, HTN, HLD, ILD on Home O2, diastolic HF, and atrial flutter who presents for follow up in the [facility].  The patient was initially diagnosed with atrial flutter on a cardiac monitor 12/24/20 with Zio patch showing both atrial flutter and atrial tachycardia 37% burden with rapid rates. The monitor was placed for symptoms of palpitations. Of note, she was diagnosed with COVID 11/22/20.  She has a CHADS2VASC score of 5. Her diltiazem was increased to 240 mg daily on 2/3 and she reports her palpitations have improved although still present. Patient did have some increased lower extremity edema and SOB. She admits she skipped several doses of her Lasix. She resumed the medication and her symptoms resolved.   Patient was admitted 2/24-2/28/22 with acute respiratory failure secondary to acute on chronic diastolic CHF, rapid atrial fibrillation, and COPD exacerbation. She was transitioned to torsemide. She spontaneously converted to SR with treatment of COPD and diureses.   On follow up today, patient reports that over the weekend she noticed variable heart rates as well as an increase in her weight. Her SOB is chronic, maybe a little worse recently. She is in afib today. There were no specific triggers that she can identify. Unfortunately, she did run out of Eliquis on 01/29/21. No orthopnea or PND.  Today, she denies symptoms of chest pain, orthopnea, PND, dizziness, presyncope, syncope, snoring, daytime somnolence, bleeding, or neurologic sequela. The patient is tolerating medications without difficulties and is otherwise without complaint today.    Atrial Fibrillation Risk Factors:  she does not have symptoms or  diagnosis of sleep apnea. she does not have a history of rheumatic fever.   she has a BMI of Body mass index is 41.81 kg/m.Marland Kitchen Filed Weights   02/05/21 1535  Weight: 93.9 kg    Family History  Problem Relation Age of Onset  . Breast cancer Mother   . Coronary artery disease Father   . Rheum arthritis Father      Atrial Fibrillation Management history:  Previous antiarrhythmic drugs: none Previous cardioversions: none Previous ablations: none CHADS2VASC score: 5 Anticoagulation history: Eliquis   Past Medical History:  Diagnosis Date  . Arthritis    OA RIGHT KNEE WITH PAIN  . Barrett esophagus   . Bradycardia 06/01/2015  . CAD in native artery    a. NSTEMI 05/2015 s/p emergent CABG.  . Chronic diastolic (congestive) heart failure (HCC)   . Chronic kidney disease, stage 3a (HCC)   . Chronic respiratory failure (HCC)   . Chronic respiratory failure with hypoxia and hypercapnia (HCC) 02/04/2010   Followed in Pulmonary clinic/ [facility]/ [facility]       - 02 dependent  since 07/02/10 >>  83% RA December 05, 2010       - ONO RA 08/05/12  :  Positive sat < 89 x 2:14m> repeat on 2lpm rec 08/12/2012  - 06/17/2013 reported desat with activity p Knee surgery > rec restart 2lpm with activity  - 06/27/2013   Walked 2lpm  x one lap @ 185 stopped due to sat 88% not sob , desat to 82% on RA just at th  . COPD III spirometry if use FEV1/VC p saba  07/18/2010   Quit smoking May 2006       -  PFT's  04/12/10 FEV1  1.21 (69%) ratio 77 and no change p B2,  DLC0 56%   VC 70%         - PFTs  08/08/2013 FEV1 1.21 (60%) ratio 86 and no change p B2 DLCO 79%  VC 72%  On symbicort 160 2bid  - PFT's  02/08/2018  FEV1 0.70 (40 % ) ratio 56 if use FEV1/VC  p 38 % improvement from saba p symb 160 prior to study with DLCO  78 % corrects to 147  % for alv volume   - 02/08/2018  . Cough variant asthma 02/26/2011   Followed in Pulmonary clinic/ [facility]/ [facility]  - PFT's  06/04/15  FEV1 1.20 (67 % ) ratio 83  p 6 %  improvement from saba with DLCO  80 % corrects to 132 % for alv volume      - Clinical dx based on response to symbicort       FENO 09/16/2016  =   96 on symbicort 160 2bid > added singulair  Allergy profile 09/16/2016 >  Eos 0.5 /  IgE  78 neg RAST  -  Referred to rehab 04/29/2017 > completed  . Essential hypertension 04/20/2007   Qualifier: Diagnosis of  By: Mondi, RN, Judy Ann   . GERD (gastroesophageal reflux disease)   . History of ARDS 2006  . History of home oxygen therapy    AT NIGHT WHEN SLEEPING 2 L / MIN NASAL CANNULA  . Hyperlipidemia 07/12/2015  . Hypothyroidism   . Intracranial hemorrhage (HCC) 2019   a. small intracranial hemorrhage in setting of HTN.  . Mild carotid artery disease (HCC)    a. Duplex 1-39% bilaterally in 2016.  . Morbid (severe) obesity due to excess calories (HCC) 04/22/2015   pfts with erv 14% 06/04/15  And 33% 02/08/2018   . NSTEMI (non-ST elevated myocardial infarction) (HCC) 05/31/2015  . Pneumococcal pneumonia (HCC) 2006   HOSPITALIZED AND DEVELOPED ARDS  . Psoriatic arthritis (HCC)   . PULMONARY FIBROSIS ILD POST INFLAMMATORY CHRONIC 07/18/2010   Followed as Primary Care Patient/  Healthcare/ [facility]  -s/p ARDS 2006 with bacteremic S  Pna       - CT chest 07/03/10 Nonspecific PF mostly upper lobes       - CT chest 12/03/10 acute gg changes and effusions c/w chf - PFT's  02/08/2018  FVC 0.64 (28 %)   with DLCO  78 % corrects to 147 % for alv volume     . Rheumatic disease   . 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 SOB (shortness of breath) on exertion    Past Surgical History:  Procedure Laterality Date  . ABDOMINOPLASTY    . CARDIAC CATHETERIZATION N/A 06/01/2015   Procedure: Left Heart Cath and Coronary Angiography;  Surgeon: Jonathan J Berry, MD;  Location: [facility] INVASIVE CV LAB;  Service: Cardiovascular;  Laterality: N/A;  . CARPAL TUNNEL RELEASE    . CHOLECYSTECTOMY    . CORONARY ARTERY BYPASS GRAFT N/A 06/04/2015   Procedure: CORONARY ARTERY BYPASS GRAFT times three             with left internal mammary artery and right leg saphenous vein;  Surgeon: Bryan K Bartle, MD;  Location: [facility] OR;  Service: Open Heart Surgery;  Laterality: N/A;  . cosmetic breast surgery    . JOINT REPLACEMENT    . KNEE ARTHROSCOPY Left   . TEE WITHOUT CARDIOVERSION  06/04/2015   Procedure: TRANSESOPHAGEAL ECHOCARDIOGRAM (TEE);    Surgeon: Alleen Borne, MD;  Location: [facility] OR;  Service: Open Heart Surgery;;  . TOTAL KNEE ARTHROPLASTY Left   . TOTAL KNEE ARTHROPLASTY Right 06/06/2013   Procedure: RIGHT TOTAL KNEE ARTHROPLASTY;  Surgeon: Loanne Drilling, MD;  Location: [facility] ORS;  Service: Orthopedics;  Laterality: Right;    Current Outpatient Medications  Medication Sig Dispense Refill  . acetaminophen (TYLENOL) 650 MG CR tablet Take 650 mg by mouth 2 (two) times daily.    Marland Kitchen albuterol (VENTOLIN HFA) 108 (90 Base) MCG/ACT inhaler Inhale 2 puffs into the lungs every 4 (four) hours as needed (shortness of breath, if you can't catch your breath). 8 g 5  . amoxicillin (AMOXIL) 500 MG tablet Take 1,000 mg by mouth as directed. Dental procedure    . apixaban (ELIQUIS) 5 MG TABS tablet Take 1 tablet (5 mg total) by mouth 2 (two) times daily. 180 tablet 1  . atorvastatin (LIPITOR) 40 MG tablet TAKE 1 TABLET BY MOUTH  DAILY AT 6 PM. Please schedule appointment for future refills. Thank you (Patient taking differently: No sig reported) 90 tablet 0  . azithromycin (ZITHROMAX) 250 MG tablet Take 2 on day one then 1 daily x 4 days (Patient taking differently: as needed. Take 2 on day one then 1 daily x 4 days) 6 tablet 11  . budesonide (PULMICORT) 0.25 MG/2ML nebulizer solution One twice daily 120 mL 12  . CALCIUM PO Take 1 tablet by mouth daily.    . Cholecalciferol (VITAMIN D3) 2000 UNITS TABS Take 2,000 Int'l Units by mouth daily.    . citalopram (CELEXA) 40 MG tablet TAKE 1 TABLET BY MOUTH IN  THE MORNING 90 tablet 3  . cyclobenzaprine (FLEXERIL) 5 MG tablet TAKE 1 TABLET BY MOUTH AT  BEDTIME AS NEEDED FOR   MUSCLE SPASM(S) (Patient taking differently: TAKE 1 TABLET BY MOUTH AT  BEDTIME AS NEEDED FOR  MUSCLE SPASM(S)) 90 tablet 0  . diltiazem (CARDIZEM CD) 240 MG 24 hr capsule Take 1 capsule (240 mg total) by mouth daily. 90 capsule 1  . ferrous sulfate 325 (65 FE) MG tablet Take 1 tablet (325 mg total) by mouth daily with breakfast. 30 tablet 0  . formoterol (PERFOROMIST) 20 MCG/2ML nebulizer solution Take 2 mLs (20 mcg total) by nebulization 2 (two) times daily. Use in nebulizer twice daily perfectly regularly 120 mL 11  . golimumab (SIMPONI ARIA) 50 MG/4ML SOLN injection Inject 50 mg into the vein every 8 (eight) weeks.     . hydrocortisone 2.5 % cream Apply 1 application topically 2 (two) times daily.    Marland Kitchen ketoconazole (NIZORAL) 2 % cream Apply topically daily as needed.    . leflunomide (ARAVA) 20 MG tablet Take 1 tablet (20 mg total) by mouth daily.    Marland Kitchen levothyroxine (SYNTHROID) 125 MCG tablet TAKE 1 TABLET BY MOUTH EVERY DAY 90 tablet 1  . losartan (COZAAR) 50 MG tablet Take 1 tablet (50 mg total) by mouth daily. 90 tablet 3  . montelukast (SINGULAIR) 10 MG tablet Take 1 tablet (10 mg total) by mouth at bedtime. 90 tablet 3  . OXYGEN Inhale 3 L into the lungs continuous. continuous o2    . pantoprazole (PROTONIX) 40 MG tablet Take 1 tablet (40 mg total) by mouth daily before breakfast. 90 tablet 3  . predniSONE (DELTASONE) 10 MG tablet Take by mouth.    . Tiotropium Bromide Monohydrate (SPIRIVA RESPIMAT) 2.5 MCG/ACT AERS USE 2 INHALATIONS BY MOUTH  EACH MORNING (Patient taking differently:  USE 2 INHALATIONS BY MOUTH  EACH MORNING) 12 g 4  . torsemide (DEMADEX) 20 MG tablet Take 1 tablet (20 mg total) by mouth daily. 90 tablet 3  . traMADol (ULTRAM) 50 MG tablet Take 1 tablet (50 mg total) by mouth 2 (two) times daily. 60 tablet 1  . triamcinolone cream (KENALOG) 0.1 % Apply 1 application topically 2 (two) times daily as needed (for psoriasis).     . vitamin B-12 (CYANOCOBALAMIN) 1000 MCG tablet  Take 1,000 mcg by mouth daily.     No current facility-administered medications for this encounter.    Allergies  Allergen Reactions  . Gabapentin Other (See Comments)    Dizziness, lighthead    Social History   Socioeconomic History  . Marital status: Widowed    Spouse name: Not on file  . Number of children: Not on file  . Years of education: Not on file  . Highest education level: Not on file  Occupational History  . Occupation: retired    Associate Professor: Designer, [employer]  Tobacco Use  . Smoking status: Former Smoker    Packs/day: 1.50    Years: 58.50    Pack years: 87.75    Types: Cigarettes    Quit date: 03/03/2005    Years since quitting: 15.9  . Smokeless tobacco: Never Used  Vaping Use  . Vaping Use: Never used  Substance and Sexual Activity  . Alcohol use: No  . Drug use: No  . Sexual activity: Not on file  Other Topics Concern  . Not on file  Social History Narrative  . Not on file   Social Determinants of Health   Financial Resource Strain: Low Risk   . Difficulty of Paying Living Expenses: Not hard at all  Food Insecurity: Not on file  Transportation Needs: No Transportation Needs  . Lack of Transportation (Medical): No  . Lack of Transportation (Non-Medical): No  Physical Activity: Not on file  Stress: Not on file  Social Connections: Not on file  Intimate Partner Violence: Not on file     ROS- All systems are reviewed and negative except as per the HPI above.  Physical Exam: Vitals:   02/05/21 1535  BP: 132/72  Pulse: 95  Weight: 93.9 kg  Height: 4\' 11"  (1.499 m)    GEN- The patient is a well appearing obese female, alert and oriented x 3 today.   HEENT-head normocephalic, atraumatic, sclera clear, conjunctiva pink, hearing intact, trachea midline. Lungs- bilateral mild wheezing, O2 nasal cannula  Heart- irregular rate and rhythm, no murmurs, rubs or gallops  GI- soft, NT, ND, + BS Extremities- no clubbing, cyanosis, or edema MS- no  significant deformity or atrophy Skin- no rash or lesion Psych- euthymic mood, full affect Neuro- strength and sensation are intact   Wt Readings from Last 3 Encounters:  02/05/21 93.9 kg  01/11/21 88.5 kg  01/07/21 88.5 kg    EKG today demonstrates  Coarse Afib Vent. rate 95 BPM PR interval * ms QRS duration 88 ms QT/QTcB 356/447 ms  Echo 12/27/20 demonstrated  1. Left ventricular ejection fraction, by estimation, is 60 to 65%. The  left ventricle has normal function. The left ventricle has no regional  wall motion abnormalities. There is severe left ventricular hypertrophy.  Left ventricular diastolic parameters  are indeterminate.  2. Right ventricular systolic function is normal. The right ventricular  size is normal.  3. Left atrial size was moderately dilated.  4. The mitral valve is normal in structure. No  evidence of mitral valve  regurgitation. No evidence of mitral stenosis. Moderate mitral annular  calcification.  5. The aortic valve is normal in structure. Aortic valve regurgitation is  not visualized. No aortic stenosis is present.  6. The inferior vena cava is dilated in size with <50% respiratory  variability, suggesting right atrial pressure of 15 mmHg.  Epic records are reviewed at length today  CHA2DS2-VASc Score = 7  The patient's score is based upon: CHF History: Yes HTN History: Yes Diabetes History: No Stroke History: Yes Vascular Disease History: Yes Age Score: 1 Gender Score: 1      ASSESSMENT AND PLAN: 1. Persistent atrial fibrillation/Atrial flutter The patient's CHA2DS2-VASc score is 7, indicating a 11.2% annual risk of stroke.   Patient is back in symptomatic afib. We discussed therapeutic options today.  Would avoid class IC with h/o CAD and avoid Multaq with CHF. With her h/o ILD amiodarone would also not be ideal.  Short term, will arrange for TEE guided DCCV given her missed doses of anticoagulation. Check bmet/cbc/mag. Long  term, dofetilide is really her only option for rhythm control. D/w EP and she is not an ablation candidate.  Continue Eliquis 5 mg BID Continue diltiazem 240 mg daily  2. Secondary Hypercoagulable State (ICD10:  D68.69) The patient is at significant risk for stroke/thromboembolism based upon her CHA2DS2-VASc Score of 7.  Continue Apixaban (Eliquis).   3. Obesity Body mass index is 41.81 kg/m. Lifestyle modification was discussed and encouraged including regular physical activity and weight reduction.  4. CAD S/p CABG No anginal symptoms.  5. HTN Stable, no changes today.  6. Chronic diastolic CHF Her weight is up today ~10 lbs. Increase torsemide to 40 mg x 3 days then decrease back to 20 mg daily. bmet as above. Hopefully, this will improve with SR.   Follow up in the AF clinic post DCCV.    Jorja Loa PA-C Af[facility] Clinic [facility] [address] [place], [place] [address] [phone] 02/05/2021 3:40 PM

## 2021-02-05 NOTE — Progress Notes (Signed)
Primary Care Physician: [name], [name], MD Primary Cardiologist: Dr Izora Ribas Primary Electrophysiologist: none Referring Physician: Dr Herminio Commons [name] is a 75 y.o. female with a history of CAD s/p NSTEMI and CABG, COPD, HTN, HLD, ILD on Home O2, diastolic HF, and atrial flutter who presents for follow up in the [facility].  The patient was initially diagnosed with atrial flutter on a cardiac monitor 12/24/20 with Zio patch showing both atrial flutter and atrial tachycardia 37% burden with rapid rates. The monitor was placed for symptoms of palpitations. Of note, she was diagnosed with COVID 11/22/20.  She has a CHADS2VASC score of 5. Her diltiazem was increased to 240 mg daily on 2/3 and she reports her palpitations have improved although still present. Patient did have some increased lower extremity edema and SOB. She admits she skipped several doses of her Lasix. She resumed the medication and her symptoms resolved.   Patient was admitted 2/24-2/28/22 with acute respiratory failure secondary to acute on chronic diastolic CHF, rapid atrial fibrillation, and COPD exacerbation. She was transitioned to torsemide. She spontaneously converted to SR with treatment of COPD and diureses.   On follow up today, patient reports that over the weekend she noticed variable heart rates as well as an increase in her weight. Her SOB is chronic, maybe a little worse recently. She is in afib today. There were no specific triggers that she can identify. Unfortunately, she did run out of Eliquis on 01/29/21. No orthopnea or PND.  Today, she denies symptoms of chest pain, orthopnea, PND, dizziness, presyncope, syncope, snoring, daytime somnolence, bleeding, or neurologic sequela. The patient is tolerating medications without difficulties and is otherwise without complaint today.    Atrial Fibrillation Risk Factors:  she does not have symptoms or  diagnosis of sleep apnea. she does not have a history of rheumatic fever.   she has a BMI of Body mass index is 41.81 kg/m.Marland Kitchen Filed Weights   02/05/21 1535  Weight: 93.9 kg    Family History  Problem Relation Age of Onset  . Breast cancer Mother   . Coronary artery disease Father   . Rheum arthritis Father      Atrial Fibrillation Management history:  Previous antiarrhythmic drugs: none Previous cardioversions: none Previous ablations: none CHADS2VASC score: 5 Anticoagulation history: Eliquis   Past Medical History:  Diagnosis Date  . Arthritis    OA RIGHT KNEE WITH PAIN  . Barrett esophagus   . Bradycardia 06/01/2015  . CAD in native artery    a. NSTEMI 05/2015 s/p emergent CABG.  . Chronic diastolic (congestive) heart failure (HCC)   . Chronic kidney disease, stage 3a (HCC)   . Chronic respiratory failure (HCC)   . Chronic respiratory failure with hypoxia and hypercapnia (HCC) 02/04/2010   Followed in Pulmonary clinic/ [facility]       - 02 dependent  since 07/02/10 >>  83% RA December 05, 2010       - ONO RA 08/05/12  :  Positive sat < 89 x 2:14m> repeat on 2lpm rec 08/12/2012  - 06/17/2013 reported desat with activity p Knee surgery > rec restart 2lpm with activity  - 06/27/2013   Walked 2lpm  x one lap @ 185 stopped due to sat 88% not sob , desat to 82% on RA just at th  . COPD III spirometry if use FEV1/VC p saba  07/18/2010   Quit smoking May 2006       -  PFT's  04/12/10 FEV1  1.21 (69%) ratio 77 and no change p B2,  DLC0 56%   VC 70%         - PFTs  08/08/2013 FEV1 1.21 (60%) ratio 86 and no change p B2 DLCO 79%  VC 72%  On symbicort 160 2bid  - PFT's  02/08/2018  FEV1 0.70 (40 % ) ratio 56 if use FEV1/VC  p 38 % improvement from saba p symb 160 prior to study with DLCO  78 % corrects to 147  % for alv volume   - 02/08/2018  . Cough variant asthma 02/26/2011   Followed in Pulmonary clinic/ [facility]  - PFT's  06/04/15  FEV1 1.20 (67 % ) ratio 83  p 6 %  improvement from saba with DLCO  80 % corrects to 132 % for alv volume      - Clinical dx based on response to symbicort       FENO 09/16/2016  =   96 on symbicort 160 2bid > added singulair  Allergy profile 09/16/2016 >  Eos 0.5 /  IgE  78 neg RAST  -  Referred to rehab 04/29/2017 > completed  . Essential hypertension 04/20/2007   Qualifier: Diagnosis of  By: Marcelyn Ditty RN, Katy Fitch   . GERD (gastroesophageal reflux disease)   . History of ARDS 2006  . History of home oxygen therapy    AT NIGHT WHEN SLEEPING 2 L / MIN NASAL CANNULA  . Hyperlipidemia 07/12/2015  . Hypothyroidism   . Intracranial hemorrhage (HCC) 2019   a. small intracranial hemorrhage in setting of HTN.  . Mild carotid artery disease (HCC)    a. Duplex 1-39% bilaterally in 2016.  . Morbid (severe) obesity due to excess calories (HCC) 04/22/2015   pfts with erv 14% 06/04/15  And 33% 02/08/2018   . NSTEMI (non-ST elevated myocardial infarction) (HCC) 05/31/2015  . Pneumococcal pneumonia (HCC) 2006   HOSPITALIZED AND DEVELOPED ARDS  . Psoriatic arthritis (HCC)   . PULMONARY FIBROSIS ILD POST INFLAMMATORY CHRONIC 07/18/2010   Followed as Primary Care Patient/ [facility]  -s/p ARDS 2006 with bacteremic S  Pna       - CT chest 07/03/10 Nonspecific PF mostly upper lobes       - CT chest 12/03/10 acute gg changes and effusions c/w chf - PFT's  02/08/2018  FVC 0.64 (28 %)   with DLCO  78 % corrects to 147 % for alv volume     . Rheumatic disease   . 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 SOB (shortness of breath) on exertion    Past Surgical History:  Procedure Laterality Date  . ABDOMINOPLASTY    . CARDIAC CATHETERIZATION N/A 06/01/2015   Procedure: Left Heart Cath and Coronary Angiography;  Surgeon: Runell Gess, MD;  Location: [facility] INVASIVE CV LAB;  Service: Cardiovascular;  Laterality: N/A;  . CARPAL TUNNEL RELEASE    . CHOLECYSTECTOMY    . CORONARY ARTERY BYPASS GRAFT N/A 06/04/2015   Procedure: CORONARY ARTERY BYPASS GRAFT times three             with left internal mammary artery and right leg saphenous vein;  Surgeon: Alleen Borne, MD;  Location: [facility] OR;  Service: Open Heart Surgery;  Laterality: N/A;  . cosmetic breast surgery    . JOINT REPLACEMENT    . KNEE ARTHROSCOPY Left   . TEE WITHOUT CARDIOVERSION  06/04/2015   Procedure: TRANSESOPHAGEAL ECHOCARDIOGRAM (TEE);  Surgeon: Alleen Borne, MD;  Location: [facility] OR;  Service: Open Heart Surgery;;  . TOTAL KNEE ARTHROPLASTY Left   . TOTAL KNEE ARTHROPLASTY Right 06/06/2013   Procedure: RIGHT TOTAL KNEE ARTHROPLASTY;  Surgeon: Loanne Drilling, MD;  Location: [facility] ORS;  Service: Orthopedics;  Laterality: Right;    Current Outpatient Medications  Medication Sig Dispense Refill  . acetaminophen (TYLENOL) 650 MG CR tablet Take 650 mg by mouth 2 (two) times daily.    Marland Kitchen albuterol (VENTOLIN HFA) 108 (90 Base) MCG/ACT inhaler Inhale 2 puffs into the lungs every 4 (four) hours as needed (shortness of breath, if you can't catch your breath). 8 g 5  . amoxicillin (AMOXIL) 500 MG tablet Take 1,000 mg by mouth as directed. Dental procedure    . apixaban (ELIQUIS) 5 MG TABS tablet Take 1 tablet (5 mg total) by mouth 2 (two) times daily. 180 tablet 1  . atorvastatin (LIPITOR) 40 MG tablet TAKE 1 TABLET BY MOUTH  DAILY AT 6 PM. Please schedule appointment for future refills. Thank you (Patient taking differently: No sig reported) 90 tablet 0  . azithromycin (ZITHROMAX) 250 MG tablet Take 2 on day one then 1 daily x 4 days (Patient taking differently: as needed. Take 2 on day one then 1 daily x 4 days) 6 tablet 11  . budesonide (PULMICORT) 0.25 MG/2ML nebulizer solution One twice daily 120 mL 12  . CALCIUM PO Take 1 tablet by mouth daily.    . Cholecalciferol (VITAMIN D3) 2000 UNITS TABS Take 2,000 Int'l Units by mouth daily.    . citalopram (CELEXA) 40 MG tablet TAKE 1 TABLET BY MOUTH IN  THE MORNING 90 tablet 3  . cyclobenzaprine (FLEXERIL) 5 MG tablet TAKE 1 TABLET BY MOUTH AT  BEDTIME AS NEEDED FOR   MUSCLE SPASM(S) (Patient taking differently: TAKE 1 TABLET BY MOUTH AT  BEDTIME AS NEEDED FOR  MUSCLE SPASM(S)) 90 tablet 0  . diltiazem (CARDIZEM CD) 240 MG 24 hr capsule Take 1 capsule (240 mg total) by mouth daily. 90 capsule 1  . ferrous sulfate 325 (65 FE) MG tablet Take 1 tablet (325 mg total) by mouth daily with breakfast. 30 tablet 0  . formoterol (PERFOROMIST) 20 MCG/2ML nebulizer solution Take 2 mLs (20 mcg total) by nebulization 2 (two) times daily. Use in nebulizer twice daily perfectly regularly 120 mL 11  . golimumab (SIMPONI ARIA) 50 MG/4ML SOLN injection Inject 50 mg into the vein every 8 (eight) weeks.     . hydrocortisone 2.5 % cream Apply 1 application topically 2 (two) times daily.    Marland Kitchen ketoconazole (NIZORAL) 2 % cream Apply topically daily as needed.    . leflunomide (ARAVA) 20 MG tablet Take 1 tablet (20 mg total) by mouth daily.    Marland Kitchen levothyroxine (SYNTHROID) 125 MCG tablet TAKE 1 TABLET BY MOUTH EVERY DAY 90 tablet 1  . losartan (COZAAR) 50 MG tablet Take 1 tablet (50 mg total) by mouth daily. 90 tablet 3  . montelukast (SINGULAIR) 10 MG tablet Take 1 tablet (10 mg total) by mouth at bedtime. 90 tablet 3  . OXYGEN Inhale 3 L into the lungs continuous. continuous o2    . pantoprazole (PROTONIX) 40 MG tablet Take 1 tablet (40 mg total) by mouth daily before breakfast. 90 tablet 3  . predniSONE (DELTASONE) 10 MG tablet Take by mouth.    . Tiotropium Bromide Monohydrate (SPIRIVA RESPIMAT) 2.5 MCG/ACT AERS USE 2 INHALATIONS BY MOUTH  EACH MORNING (Patient taking differently:  USE 2 INHALATIONS BY MOUTH  EACH MORNING) 12 g 4  . torsemide (DEMADEX) 20 MG tablet Take 1 tablet (20 mg total) by mouth daily. 90 tablet 3  . traMADol (ULTRAM) 50 MG tablet Take 1 tablet (50 mg total) by mouth 2 (two) times daily. 60 tablet 1  . triamcinolone cream (KENALOG) 0.1 % Apply 1 application topically 2 (two) times daily as needed (for psoriasis).     . vitamin B-12 (CYANOCOBALAMIN) 1000 MCG tablet  Take 1,000 mcg by mouth daily.     No current facility-administered medications for this encounter.    Allergies  Allergen Reactions  . Gabapentin Other (See Comments)    Dizziness, lighthead    Social History   Socioeconomic History  . Marital status: Widowed    Spouse name: Not on file  . Number of children: Not on file  . Years of education: Not on file  . Highest education level: Not on file  Occupational History  . Occupation: retired    Associate Professor: Designer, jewellery  Tobacco Use  . Smoking status: Former Smoker    Packs/day: 1.50    Years: 58.50    Pack years: 87.75    Types: Cigarettes    Quit date: 03/03/2005    Years since quitting: 15.9  . Smokeless tobacco: Never Used  Vaping Use  . Vaping Use: Never used  Substance and Sexual Activity  . Alcohol use: No  . Drug use: No  . Sexual activity: Not on file  Other Topics Concern  . Not on file  Social History Narrative  . Not on file   Social Determinants of Health   Financial Resource Strain: Low Risk   . Difficulty of Paying Living Expenses: Not hard at all  Food Insecurity: Not on file  Transportation Needs: No Transportation Needs  . Lack of Transportation (Medical): No  . Lack of Transportation (Non-Medical): No  Physical Activity: Not on file  Stress: Not on file  Social Connections: Not on file  Intimate Partner Violence: Not on file     ROS- All systems are reviewed and negative except as per the HPI above.  Physical Exam: Vitals:   02/05/21 1535  BP: 132/72  Pulse: 95  Weight: 93.9 kg  Height: 4\' 11"  (1.499 m)    GEN- The patient is a well appearing obese female, alert and oriented x 3 today.   HEENT-head normocephalic, atraumatic, sclera clear, conjunctiva pink, hearing intact, trachea midline. Lungs- bilateral mild wheezing, O2 nasal cannula  Heart- irregular rate and rhythm, no murmurs, rubs or gallops  GI- soft, NT, ND, + BS Extremities- no clubbing, cyanosis, or edema MS- no  significant deformity or atrophy Skin- no rash or lesion Psych- euthymic mood, full affect Neuro- strength and sensation are intact   Wt Readings from Last 3 Encounters:  02/05/21 93.9 kg  01/11/21 88.5 kg  01/07/21 88.5 kg    EKG today demonstrates  Coarse Afib Vent. rate 95 BPM PR interval * ms QRS duration 88 ms QT/QTcB 356/447 ms  Echo 12/27/20 demonstrated  1. Left ventricular ejection fraction, by estimation, is 60 to 65%. The  left ventricle has normal function. The left ventricle has no regional  wall motion abnormalities. There is severe left ventricular hypertrophy.  Left ventricular diastolic parameters  are indeterminate.  2. Right ventricular systolic function is normal. The right ventricular  size is normal.  3. Left atrial size was moderately dilated.  4. The mitral valve is normal in structure. No  evidence of mitral valve  regurgitation. No evidence of mitral stenosis. Moderate mitral annular  calcification.  5. The aortic valve is normal in structure. Aortic valve regurgitation is  not visualized. No aortic stenosis is present.  6. The inferior vena cava is dilated in size with <50% respiratory  variability, suggesting right atrial pressure of 15 mmHg.  Epic records are reviewed at length today  CHA2DS2-VASc Score = 7  The patient's score is based upon: CHF History: Yes HTN History: Yes Diabetes History: No Stroke History: Yes Vascular Disease History: Yes Age Score: 1 Gender Score: 1      ASSESSMENT AND PLAN: 1. Persistent atrial fibrillation/Atrial flutter The patient's CHA2DS2-VASc score is 7, indicating a 11.2% annual risk of stroke.   Patient is back in symptomatic afib. We discussed therapeutic options today.  Would avoid class IC with h/o CAD and avoid Multaq with CHF. With her h/o ILD amiodarone would also not be ideal.  Short term, will arrange for TEE guided DCCV given her missed doses of anticoagulation. Check bmet/cbc/mag. Long  term, dofetilide is really her only option for rhythm control. D/w EP and she is not an ablation candidate.  Continue Eliquis 5 mg BID Continue diltiazem 240 mg daily  2. Secondary Hypercoagulable State (ICD10:  D68.69) The patient is at significant risk for stroke/thromboembolism based upon her CHA2DS2-VASc Score of 7.  Continue Apixaban (Eliquis).   3. Obesity Body mass index is 41.81 kg/m. Lifestyle modification was discussed and encouraged including regular physical activity and weight reduction.  4. CAD S/p CABG No anginal symptoms.  5. HTN Stable, no changes today.  6. Chronic diastolic CHF Her weight is up today ~10 lbs. Increase torsemide to 40 mg x 3 days then decrease back to 20 mg daily. bmet as above. Hopefully, this will improve with SR.   Follow up in the AF clinic post DCCV.    Jorja Loa PA-C Afib Clinic [facility] [address] [place], [place] [address] [phone] 02/05/2021 3:40 PM

## 2021-02-06 ENCOUNTER — Encounter: Payer: Self-pay | Admitting: Cardiology

## 2021-02-06 ENCOUNTER — Telehealth: Payer: Self-pay | Admitting: Internal Medicine

## 2021-02-06 ENCOUNTER — Telehealth: Payer: Self-pay | Admitting: Cardiology

## 2021-02-06 DIAGNOSIS — M5431 Sciatica, right side: Secondary | ICD-10-CM | POA: Diagnosis not present

## 2021-02-06 DIAGNOSIS — I501 Left ventricular failure: Secondary | ICD-10-CM | POA: Diagnosis not present

## 2021-02-06 DIAGNOSIS — M4854XD Collapsed vertebra, not elsewhere classified, thoracic region, subsequent encounter for fracture with routine healing: Secondary | ICD-10-CM | POA: Diagnosis not present

## 2021-02-06 DIAGNOSIS — D6869 Other thrombophilia: Secondary | ICD-10-CM | POA: Diagnosis not present

## 2021-02-06 DIAGNOSIS — L4050 Arthropathic psoriasis, unspecified: Secondary | ICD-10-CM | POA: Diagnosis not present

## 2021-02-06 DIAGNOSIS — I4892 Unspecified atrial flutter: Secondary | ICD-10-CM | POA: Diagnosis not present

## 2021-02-06 DIAGNOSIS — I700 Atherosclerosis of aorta: Secondary | ICD-10-CM | POA: Diagnosis not present

## 2021-02-06 DIAGNOSIS — J9621 Acute and chronic respiratory failure with hypoxia: Secondary | ICD-10-CM | POA: Diagnosis not present

## 2021-02-06 DIAGNOSIS — I252 Old myocardial infarction: Secondary | ICD-10-CM | POA: Diagnosis not present

## 2021-02-06 DIAGNOSIS — I2510 Atherosclerotic heart disease of native coronary artery without angina pectoris: Secondary | ICD-10-CM | POA: Diagnosis not present

## 2021-02-06 DIAGNOSIS — I130 Hypertensive heart and chronic kidney disease with heart failure and stage 1 through stage 4 chronic kidney disease, or unspecified chronic kidney disease: Secondary | ICD-10-CM | POA: Diagnosis not present

## 2021-02-06 DIAGNOSIS — J9622 Acute and chronic respiratory failure with hypercapnia: Secondary | ICD-10-CM | POA: Diagnosis not present

## 2021-02-06 DIAGNOSIS — J80 Acute respiratory distress syndrome: Secondary | ICD-10-CM | POA: Diagnosis not present

## 2021-02-06 DIAGNOSIS — J8410 Pulmonary fibrosis, unspecified: Secondary | ICD-10-CM | POA: Diagnosis not present

## 2021-02-06 DIAGNOSIS — J441 Chronic obstructive pulmonary disease with (acute) exacerbation: Secondary | ICD-10-CM | POA: Diagnosis not present

## 2021-02-06 DIAGNOSIS — I5033 Acute on chronic diastolic (congestive) heart failure: Secondary | ICD-10-CM | POA: Diagnosis not present

## 2021-02-06 DIAGNOSIS — F32A Depression, unspecified: Secondary | ICD-10-CM | POA: Diagnosis not present

## 2021-02-06 DIAGNOSIS — K219 Gastro-esophageal reflux disease without esophagitis: Secondary | ICD-10-CM | POA: Diagnosis not present

## 2021-02-06 DIAGNOSIS — E039 Hypothyroidism, unspecified: Secondary | ICD-10-CM | POA: Diagnosis not present

## 2021-02-06 DIAGNOSIS — M069 Rheumatoid arthritis, unspecified: Secondary | ICD-10-CM | POA: Diagnosis not present

## 2021-02-06 DIAGNOSIS — D631 Anemia in chronic kidney disease: Secondary | ICD-10-CM | POA: Diagnosis not present

## 2021-02-06 DIAGNOSIS — J849 Interstitial pulmonary disease, unspecified: Secondary | ICD-10-CM | POA: Diagnosis not present

## 2021-02-06 DIAGNOSIS — M4804 Spinal stenosis, thoracic region: Secondary | ICD-10-CM | POA: Diagnosis not present

## 2021-02-06 DIAGNOSIS — I4891 Unspecified atrial fibrillation: Secondary | ICD-10-CM | POA: Diagnosis not present

## 2021-02-06 DIAGNOSIS — N1831 Chronic kidney disease, stage 3a: Secondary | ICD-10-CM | POA: Diagnosis not present

## 2021-02-06 NOTE — Telephone Encounter (Signed)
Spoke with daughter in regards to upcoming cardioversion.  Daughter reports pt will have her heart shocked back into rhythm on 02/12/21.  DCCV ordered by the Afib clinic.  Per daughter pt has been cleared by pulmonology and she was concerned that Dr. Izora Ribas needed to clear patient.  I informed her that Dr. Izora Ribas does not have to clear her for a cardioversion.  All questions answered daughter thanked me for response.

## 2021-02-06 NOTE — Telephone Encounter (Signed)
Patient's daughter is requesting to speak with Dr. Devin Going nurse regarding her procedure, scheduled for 02/12/21. She states she has concerns.

## 2021-02-06 NOTE — Telephone Encounter (Signed)
Ok for conversion

## 2021-02-06 NOTE — Telephone Encounter (Signed)
Spoke with Cala Bradford and notified ok per MW for conversion  She verbalized understanding  Nothing further needed

## 2021-02-06 NOTE — Telephone Encounter (Signed)
Spoke with the pt's daughter, Cala Bradford, [place] per DPR  She states that the pt is scheduled for procedure 02/12/21 "to shock her heart" She is asking if Dr Sherene Sires thinks this is okay  Cards not needing clearance, but pt and daughter want the okay to proceed  She is scheduled for ECHO TEE 02/12/21  Please advise, thanks!

## 2021-02-06 NOTE — Telephone Encounter (Signed)
Error

## 2021-02-08 ENCOUNTER — Other Ambulatory Visit (HOSPITAL_COMMUNITY)
Admission: RE | Admit: 2021-02-08 | Discharge: 2021-02-08 | Disposition: A | Discharge status: Home / Self Care | Payer: Medicare Other | Source: Ambulatory Visit | Attending: Cardiology | Admitting: Cardiology

## 2021-02-08 DIAGNOSIS — Z01812 Encounter for preprocedural laboratory examination: Secondary | ICD-10-CM | POA: Insufficient documentation

## 2021-02-08 DIAGNOSIS — Z20822 Contact with and (suspected) exposure to covid-19: Secondary | ICD-10-CM | POA: Insufficient documentation

## 2021-02-09 LAB — SARS CORONAVIRUS 2 (TAT 6-24 HRS): SARS Coronavirus 2: NEGATIVE

## 2021-02-12 ENCOUNTER — Ambulatory Visit (HOSPITAL_COMMUNITY): Payer: Medicare Other | Admitting: Anesthesiology

## 2021-02-12 ENCOUNTER — Telehealth: Payer: Self-pay | Admitting: Pharmacist

## 2021-02-12 ENCOUNTER — Encounter (HOSPITAL_COMMUNITY)
Admission: RE | Disposition: A | Discharge status: Home / Self Care | Payer: Self-pay | Source: Home / Self Care | Attending: Cardiology

## 2021-02-12 ENCOUNTER — Ambulatory Visit (HOSPITAL_COMMUNITY)
Admission: RE | Admit: 2021-02-12 | Discharge: 2021-02-12 | Disposition: A | Discharge status: Home / Self Care | Payer: Medicare Other | Attending: Cardiology | Admitting: Cardiology

## 2021-02-12 ENCOUNTER — Other Ambulatory Visit: Payer: Self-pay

## 2021-02-12 ENCOUNTER — Ambulatory Visit (HOSPITAL_BASED_OUTPATIENT_CLINIC_OR_DEPARTMENT_OTHER): Payer: Medicare Other

## 2021-02-12 DIAGNOSIS — I4892 Unspecified atrial flutter: Secondary | ICD-10-CM | POA: Diagnosis not present

## 2021-02-12 DIAGNOSIS — Z888 Allergy status to other drugs, medicaments and biological substances status: Secondary | ICD-10-CM | POA: Insufficient documentation

## 2021-02-12 DIAGNOSIS — I110 Hypertensive heart disease with heart failure: Secondary | ICD-10-CM | POA: Insufficient documentation

## 2021-02-12 DIAGNOSIS — Z9049 Acquired absence of other specified parts of digestive tract: Secondary | ICD-10-CM | POA: Insufficient documentation

## 2021-02-12 DIAGNOSIS — Z6841 Body Mass Index (BMI) 40.0 and over, adult: Secondary | ICD-10-CM | POA: Diagnosis not present

## 2021-02-12 DIAGNOSIS — I5032 Chronic diastolic (congestive) heart failure: Secondary | ICD-10-CM | POA: Diagnosis not present

## 2021-02-12 DIAGNOSIS — I4819 Other persistent atrial fibrillation: Secondary | ICD-10-CM | POA: Diagnosis not present

## 2021-02-12 DIAGNOSIS — Z87891 Personal history of nicotine dependence: Secondary | ICD-10-CM | POA: Diagnosis not present

## 2021-02-12 DIAGNOSIS — Z9981 Dependence on supplemental oxygen: Secondary | ICD-10-CM | POA: Insufficient documentation

## 2021-02-12 DIAGNOSIS — Z96653 Presence of artificial knee joint, bilateral: Secondary | ICD-10-CM | POA: Insufficient documentation

## 2021-02-12 DIAGNOSIS — I4891 Unspecified atrial fibrillation: Secondary | ICD-10-CM | POA: Insufficient documentation

## 2021-02-12 DIAGNOSIS — Z7951 Long term (current) use of inhaled steroids: Secondary | ICD-10-CM | POA: Diagnosis not present

## 2021-02-12 DIAGNOSIS — Z8249 Family history of ischemic heart disease and other diseases of the circulatory system: Secondary | ICD-10-CM | POA: Diagnosis not present

## 2021-02-12 DIAGNOSIS — Z951 Presence of aortocoronary bypass graft: Secondary | ICD-10-CM | POA: Insufficient documentation

## 2021-02-12 DIAGNOSIS — E785 Hyperlipidemia, unspecified: Secondary | ICD-10-CM | POA: Diagnosis not present

## 2021-02-12 DIAGNOSIS — Z7901 Long term (current) use of anticoagulants: Secondary | ICD-10-CM | POA: Insufficient documentation

## 2021-02-12 DIAGNOSIS — E669 Obesity, unspecified: Secondary | ICD-10-CM | POA: Insufficient documentation

## 2021-02-12 DIAGNOSIS — I340 Nonrheumatic mitral (valve) insufficiency: Secondary | ICD-10-CM | POA: Diagnosis not present

## 2021-02-12 DIAGNOSIS — I2510 Atherosclerotic heart disease of native coronary artery without angina pectoris: Secondary | ICD-10-CM | POA: Diagnosis not present

## 2021-02-12 DIAGNOSIS — K2270 Barrett's esophagus without dysplasia: Secondary | ICD-10-CM | POA: Diagnosis not present

## 2021-02-12 DIAGNOSIS — I080 Rheumatic disorders of both mitral and aortic valves: Secondary | ICD-10-CM | POA: Diagnosis not present

## 2021-02-12 DIAGNOSIS — I252 Old myocardial infarction: Secondary | ICD-10-CM | POA: Diagnosis not present

## 2021-02-12 DIAGNOSIS — I480 Paroxysmal atrial fibrillation: Secondary | ICD-10-CM | POA: Diagnosis not present

## 2021-02-12 DIAGNOSIS — Z79890 Hormone replacement therapy (postmenopausal): Secondary | ICD-10-CM | POA: Insufficient documentation

## 2021-02-12 DIAGNOSIS — Z79899 Other long term (current) drug therapy: Secondary | ICD-10-CM | POA: Diagnosis not present

## 2021-02-12 HISTORY — PX: TEE WITHOUT CARDIOVERSION: SHX5443

## 2021-02-12 HISTORY — PX: CARDIOVERSION: SHX1299

## 2021-02-12 SURGERY — ECHOCARDIOGRAM, TRANSESOPHAGEAL
Anesthesia: General

## 2021-02-12 MED ORDER — BUTAMBEN-TETRACAINE-BENZOCAINE 2-2-14 % EX AERO
INHALATION_SPRAY | CUTANEOUS | Status: DC | PRN
  Administered 2021-02-12: 2 via TOPICAL

## 2021-02-12 MED ORDER — PROPOFOL 500 MG/50ML IV EMUL
INTRAVENOUS | Status: DC | PRN
  Administered 2021-02-12: 125 ug/kg/min via INTRAVENOUS

## 2021-02-12 MED ORDER — PROPOFOL 10 MG/ML IV BOLUS
INTRAVENOUS | Status: DC | PRN
  Administered 2021-02-12: 20 mg via INTRAVENOUS

## 2021-02-12 MED ORDER — SODIUM CHLORIDE 0.9 % IV SOLN: INTRAVENOUS | Status: DC

## 2021-02-12 NOTE — Progress Notes (Incomplete)
  Echocardiogram Echocardiogram Transesophageal has been performed.  Shona Simpson 02/12/2021, 12:47 PM

## 2021-02-12 NOTE — Anesthesia Procedure Notes (Signed)
Procedure Name: MAC Date/Time: 02/12/2021 11:27 AM Performed by: Kathryne Hitch, CRNA Pre-anesthesia Checklist: Emergency Drugs available, Suction available, Patient identified and Patient being monitored Patient Re-evaluated:Patient Re-evaluated prior to induction Oxygen Delivery Method: Nasal cannula Preoxygenation: Pre-oxygenation with 100% oxygen Induction Type: IV induction Placement Confirmation: positive ETCO2 Dental Injury: Teeth and Oropharynx as per pre-operative assessment

## 2021-02-12 NOTE — Progress Notes (Signed)
ekg called to procedure room to confirm rhythm

## 2021-02-12 NOTE — Transfer of Care (Signed)
Immediate Anesthesia Transfer of Care Note  Patient: Jacquelynn Cree  Procedure(s) Performed: TRANSESOPHAGEAL ECHOCARDIOGRAM (TEE) (N/A ) CARDIOVERSION (N/A )  Patient Location: Endoscopy Unit  Anesthesia Type:MAC  Level of Consciousness: drowsy and patient cooperative  Airway & Oxygen Therapy: Patient Spontanous Breathing and Patient connected to nasal cannula oxygen  Post-op Assessment: Report given to RN and Post -op Vital signs reviewed and stable  Post vital signs: Reviewed and stable  Last Vitals:  Vitals Value Taken Time  BP 109/61 02/12/21 1206  Temp    Pulse 86 02/12/21 1206  Resp 23 02/12/21 1206  SpO2 95 % 02/12/21 1206  Vitals shown include unvalidated device data.  Last Pain:  Vitals:   02/12/21 1105  TempSrc: Oral  PainSc: 0-No pain         Complications: No complications documented.

## 2021-02-12 NOTE — Interval H&P Note (Signed)
History and Physical Interval Note:  02/12/2021 11:04 AM  [name]  has presented today for surgery, with the diagnosis of AFIB.  The various methods of treatment have been discussed with the patient and family. After consideration of risks, benefits and other options for treatment, the patient has consented to  Procedure(s): TRANSESOPHAGEAL ECHOCARDIOGRAM (TEE) (N/A) CARDIOVERSION (N/A) as a surgical intervention.  The patient's history has been reviewed, patient examined, no change in status, stable for surgery.  I have reviewed the patient's chart and labs.  Questions were answered to the patient's satisfaction.     Meriam Sprague

## 2021-02-12 NOTE — Discharge Instructions (Signed)

## 2021-02-12 NOTE — Procedures (Signed)
Procedure: Electrical Cardioversion Indications:  Atrial Fibrillation  Procedure Details:  Consent: Risks of procedure as well as the alternatives and risks of each were explained to the (patient/caregiver).  Consent for procedure obtained.  Time Out: Verified patient identification, verified procedure, site/side was marked, verified correct patient position, special equipment/implants available, medications/allergies/relevent history reviewed, required imaging and test results available. PERFORMED.  Patient placed on cardiac monitor, pulse oximetry, supplemental oxygen as necessary.  Sedation given: Propofol 270mg  for TEE/DCCV administered by CV anesthesia Pacer pads placed anterior and posterior chest.  Cardioverted 1 time(s).  Cardioversion with synchronized biphasic 150J shock.  Evaluation: Findings: Post procedure EKG shows: NSR Complications: None Patient did tolerate procedure well.  Time Spent Directly with the Patient:    02/12/2021, 12:14 PM

## 2021-02-12 NOTE — Telephone Encounter (Signed)
Medication list reviewed in anticipation of upcoming Tikosyn initiation. Patient is taking citalopram which is QTc prolonging. In addition, the recommended max dose for patients >74 years old is 20 mg daily due to increased risk of QTc prolongation. Recommend patient discuss alternative options with PCP such as duloxetine.   Patient is anticoagulated on Eliquis on the appropriate dose. Please ensure that patient has not missed any anticoagulation doses in the 3 weeks prior to Tikosyn initiation.   Patient will need to be counseled to avoid use of Benadryl while on Tikosyn and in the 2-3 days prior to Tikosyn initiation.

## 2021-02-12 NOTE — Procedures (Signed)
     Transesophageal Echocardiogram Note  [name] [id number] 19-Oct-1946  Procedure: Transesophageal Echocardiogram Indications: Atrial fibrillation  Procedure Details Consent: Obtained Time Out: Verified patient identification, verified procedure, site/side was marked, verified correct patient position, special equipment/implants available, Radiology Safety Procedures followed,  medications/allergies/relevent history reviewed, required imaging and test results available.  Performed  Medications: Propofol 270mg  administered by CV anesthesia  Left Ventrical:  Normal LVEF  Mitral Valve: Mildly thickened and calcified. Mild MR  Aortic Valve: Mild thickening and calcification. No AR  Tricuspid Valve: Normal structure, trivial TR  Pulmonic Valve: Not well visualized. No significant PR  Left Atrium/ Left atrial appendage: No evidence of thrombus  Atrial septum: No shunting seen by color doppler  Aorta: Mild plaquing   Complications: No apparent complications Patient did tolerate procedure well.  , MD 02/12/2021, 12:12 PM

## 2021-02-12 NOTE — Anesthesia Preprocedure Evaluation (Addendum)
Anesthesia Evaluation  Patient identified by MRN, date of birth, ID band Patient awake    Reviewed: Allergy & Precautions, NPO status , Patient's Chart, lab work & pertinent test results  History of Anesthesia Complications Negative for: history of anesthetic complications  Airway Mallampati: II  TM Distance: >3 FB Neck ROM: Full    Dental  (+) Edentulous Upper, Dental Advisory Given   Pulmonary neg shortness of breath, asthma , neg sleep apnea, COPD,  COPD inhaler and oxygen dependent, neg recent URI, former smoker,  H/o ILD admitted 2/24-2/28/22 with acute respiratory failure secondary to acute on chronic diastolic CHF, rapid atrial fibrillation, and COPD exacerbation  Covid-19 Nucleic Acid Test Results Lab Results      Component                Value               Date                      SARSCOV2NAA              NEGATIVE            02/08/2021                SARSCOV2NAA              NEGATIVE            12/27/2020                SARSCOV2NAA              NEGATIVE            06/07/2020                SARSCOV2NAA              NOT DETECTED        10/04/2019                SARSCOV2NAA              NEGATIVE            03/10/2019              breath sounds clear to auscultation       Cardiovascular hypertension, Pt. on medications + CAD, + Past MI, + CABG (CABGx3 2016) and +CHF  + dysrhythmias (on eliquis) Atrial Fibrillation  Rhythm:Irregular  TTE 2022 1. Left ventricular ejection fraction, by estimation, is 60 to 65%. The  left ventricle has normal function. The left ventricle has no regional  wall motion abnormalities. There is severe left ventricular hypertrophy.  Left ventricular diastolic parameters  are indeterminate.  2. Right ventricular systolic function is normal. The right ventricular  size is normal.  3. Left atrial size was moderately dilated.  4. The mitral valve is normal in structure. No evidence of mitral  valve  regurgitation. No evidence of mitral stenosis. Moderate mitral annular  calcification.  5. The aortic valve is normal in structure. Aortic valve regurgitation is  not visualized. No aortic stenosis is present.  6. The inferior vena cava is dilated in size with <50% respiratory  variability, suggesting right atrial pressure of 15 mmHg.  LHC 2016  Prox LAD to Mid LAD lesion, 75% stenosed.  Prox Cx lesion, 95% stenosed.  Prox RCA to Mid RCA lesion, 90% stenosed.  The left ventricular systolic function is normal.     Neuro/Psych PSYCHIATRIC DISORDERS Depression  negative neurological ROS     GI/Hepatic Neg liver ROS, GERD  ,  Endo/Other  Hypothyroidism Morbid obesity (BMI 42)  Renal/GU Renal InsufficiencyRenal disease (Cr 1.15, K 4.6)Lab Results      Component                Value               Date                      CREATININE               1.15 (H)            02/05/2021           Lab Results      Component                Value               Date                      K                        4.6                 02/05/2021             negative genitourinary   Musculoskeletal  (+) Arthritis ,   Abdominal   Peds  Hematology  (+) Blood dyscrasia, anemia , Lab Results      Component                Value               Date                      WBC                      10.8 (H)            02/05/2021                HGB                      11.3 (L)            02/05/2021                HCT                      39.0                02/05/2021                MCV                      99.5                02/05/2021                PLT                      242                 02/05/2021            eliquis   Anesthesia Other Findings   Reproductive/Obstetrics  Anesthesia Physical Anesthesia Plan  ASA: III  Anesthesia Plan: General   Post-op Pain Management:    Induction: Intravenous  PONV Risk Score and Plan:  3 and Propofol infusion and Treatment may vary due to age or medical condition  Airway Management Planned: Natural Airway  Additional Equipment: None  Intra-op Plan:   Post-operative Plan:   Informed Consent: I have reviewed the patients History and Physical, chart, labs and discussed the procedure including the risks, benefits and alternatives for the proposed anesthesia with the patient or authorized representative who has indicated his/her understanding and acceptance.     Dental advisory given  Plan Discussed with: CRNA  Anesthesia Plan Comments:        Anesthesia Quick Evaluation

## 2021-02-12 NOTE — Anesthesia Postprocedure Evaluation (Signed)
Anesthesia Post Note  Patient: [name]  Procedure(s) Performed: TRANSESOPHAGEAL ECHOCARDIOGRAM (TEE) (N/A ) CARDIOVERSION (N/A )     Patient location during evaluation: PACU Anesthesia Type: General Level of consciousness: awake and alert Pain management: pain level controlled Vital Signs Assessment: post-procedure vital signs reviewed and stable Respiratory status: spontaneous breathing, nonlabored ventilation, respiratory function stable and patient connected to nasal cannula oxygen Cardiovascular status: blood pressure returned to baseline and stable Postop Assessment: no apparent nausea or vomiting Anesthetic complications: no   No complications documented.  Last Vitals:  Vitals:   02/12/21 1219 02/12/21 1224  BP:    Pulse: 81 86  Resp: (!) 28 17  Temp:    SpO2: 100% 96%    Last Pain:  Vitals:   02/12/21 1206  TempSrc: Oral  PainSc: 0-No pain                 Kathee Tumlin S

## 2021-02-13 ENCOUNTER — Encounter (HOSPITAL_COMMUNITY): Payer: Self-pay | Admitting: Cardiology

## 2021-02-14 ENCOUNTER — Encounter (HOSPITAL_COMMUNITY): Payer: Self-pay | Admitting: Physician Assistant

## 2021-02-14 ENCOUNTER — Ambulatory Visit (HOSPITAL_COMMUNITY)
Admission: RE | Admit: 2021-02-14 | Discharge: 2021-02-14 | Disposition: A | Discharge status: Home / Self Care | Payer: Medicare Other | Source: Ambulatory Visit | Attending: Physician Assistant | Admitting: Physician Assistant

## 2021-02-14 ENCOUNTER — Other Ambulatory Visit: Payer: Self-pay

## 2021-02-14 ENCOUNTER — Telehealth: Payer: Self-pay | Admitting: Internal Medicine

## 2021-02-14 VITALS — BP 130/76 | HR 94 | Ht 59.0 in | Wt 199.8 lb

## 2021-02-14 DIAGNOSIS — D6869 Other thrombophilia: Secondary | ICD-10-CM | POA: Insufficient documentation

## 2021-02-14 DIAGNOSIS — I5032 Chronic diastolic (congestive) heart failure: Secondary | ICD-10-CM | POA: Insufficient documentation

## 2021-02-14 DIAGNOSIS — E669 Obesity, unspecified: Secondary | ICD-10-CM | POA: Insufficient documentation

## 2021-02-14 DIAGNOSIS — Z96653 Presence of artificial knee joint, bilateral: Secondary | ICD-10-CM | POA: Insufficient documentation

## 2021-02-14 DIAGNOSIS — Z951 Presence of aortocoronary bypass graft: Secondary | ICD-10-CM | POA: Insufficient documentation

## 2021-02-14 DIAGNOSIS — Z6841 Body Mass Index (BMI) 40.0 and over, adult: Secondary | ICD-10-CM | POA: Diagnosis not present

## 2021-02-14 DIAGNOSIS — J449 Chronic obstructive pulmonary disease, unspecified: Secondary | ICD-10-CM | POA: Insufficient documentation

## 2021-02-14 DIAGNOSIS — J849 Interstitial pulmonary disease, unspecified: Secondary | ICD-10-CM | POA: Diagnosis not present

## 2021-02-14 DIAGNOSIS — Z79891 Long term (current) use of opiate analgesic: Secondary | ICD-10-CM | POA: Insufficient documentation

## 2021-02-14 DIAGNOSIS — Z79899 Other long term (current) drug therapy: Secondary | ICD-10-CM | POA: Insufficient documentation

## 2021-02-14 DIAGNOSIS — Z7901 Long term (current) use of anticoagulants: Secondary | ICD-10-CM | POA: Diagnosis not present

## 2021-02-14 DIAGNOSIS — I252 Old myocardial infarction: Secondary | ICD-10-CM | POA: Diagnosis not present

## 2021-02-14 DIAGNOSIS — Z79890 Hormone replacement therapy (postmenopausal): Secondary | ICD-10-CM | POA: Insufficient documentation

## 2021-02-14 DIAGNOSIS — Z9981 Dependence on supplemental oxygen: Secondary | ICD-10-CM | POA: Diagnosis not present

## 2021-02-14 DIAGNOSIS — Z888 Allergy status to other drugs, medicaments and biological substances status: Secondary | ICD-10-CM | POA: Insufficient documentation

## 2021-02-14 DIAGNOSIS — I110 Hypertensive heart disease with heart failure: Secondary | ICD-10-CM | POA: Insufficient documentation

## 2021-02-14 DIAGNOSIS — Z87891 Personal history of nicotine dependence: Secondary | ICD-10-CM | POA: Insufficient documentation

## 2021-02-14 DIAGNOSIS — E785 Hyperlipidemia, unspecified: Secondary | ICD-10-CM | POA: Insufficient documentation

## 2021-02-14 DIAGNOSIS — I2510 Atherosclerotic heart disease of native coronary artery without angina pectoris: Secondary | ICD-10-CM | POA: Diagnosis not present

## 2021-02-14 DIAGNOSIS — Z7951 Long term (current) use of inhaled steroids: Secondary | ICD-10-CM | POA: Diagnosis not present

## 2021-02-14 DIAGNOSIS — I4819 Other persistent atrial fibrillation: Secondary | ICD-10-CM | POA: Diagnosis not present

## 2021-02-14 DIAGNOSIS — Z8616 Personal history of COVID-19: Secondary | ICD-10-CM | POA: Diagnosis not present

## 2021-02-14 NOTE — Progress Notes (Signed)
Primary Care Physician: [name], [name], MD Primary Cardiologist: Dr Izora Ribas Primary Electrophysiologist: none Referring Physician: Dr Herminio Commons [name] is a 75 y.o. female with a history of CAD s/p NSTEMI and CABG, COPD, HTN, HLD, ILD on Home O2, diastolic HF, and atrial flutter who presents for follow up in the [facility].  The patient was initially diagnosed with atrial flutter on a cardiac monitor 12/24/20 with Zio patch showing both atrial flutter and atrial tachycardia 37% burden with rapid rates. The monitor was placed for symptoms of palpitations. Of note, she was diagnosed with COVID 11/22/20.  She has a CHADS2VASC score of 5. Her diltiazem was increased to 240 mg daily on 2/3 and she reports her palpitations have improved although still present. Patient did have some increased lower extremity edema and SOB. She admits she skipped several doses of her Lasix. She resumed the medication and her symptoms resolved.   Patient was admitted 2/24-2/28/22 with acute respiratory failure secondary to acute on chronic diastolic CHF, rapid atrial fibrillation, and COPD exacerbation. She was transitioned to torsemide. She spontaneously converted to SR with treatment of COPD and diureses.   On follow up today, patient is s/p TEE guided DCCV on 02/12/21. She reports that she feels improved with more energy and less SOB. Her weight is down several pounds.   Today, she denies symptoms of palpitations, chest pain, orthopnea, PND, dizziness, presyncope, syncope, snoring, daytime somnolence, bleeding, or neurologic sequela. The patient is tolerating medications without difficulties and is otherwise without complaint today.    Atrial Fibrillation Risk Factors:  she does not have symptoms or diagnosis of sleep apnea. she does not have a history of rheumatic fever.   she has a BMI of Body mass index is 40.35 kg/m.Marland Kitchen Filed Weights   02/14/21  1115  Weight: 90.6 kg    Family History  Problem Relation Age of Onset  . Breast cancer Mother   . Coronary artery disease Father   . Rheum arthritis Father      Atrial Fibrillation Management history:  Previous antiarrhythmic drugs: none Previous cardioversions: 02/12/21 Previous ablations: none CHADS2VASC score: 5 Anticoagulation history: Eliquis   Past Medical History:  Diagnosis Date  . Arthritis    OA RIGHT KNEE WITH PAIN  . Barrett esophagus   . Bradycardia 06/01/2015  . CAD in native artery    a. NSTEMI 05/2015 s/p emergent CABG.  . Chronic diastolic (congestive) heart failure (HCC)   . Chronic kidney disease, stage 3a (HCC)   . Chronic respiratory failure (HCC)   . Chronic respiratory failure with hypoxia and hypercapnia (HCC) 02/04/2010   Followed in Pulmonary clinic/ [facility]/ [facility]       - 02 dependent  since 07/02/10 >>  83% RA December 05, 2010       - ONO RA 08/05/12  :  Positive sat < 89 x 2:23m> repeat on 2lpm rec 08/12/2012  - 06/17/2013 reported desat with activity p Knee surgery > rec restart 2lpm with activity  - 06/27/2013   Walked 2lpm  x one lap @ 185 stopped due to sat 88% not sob , desat to 82% on RA just at th  . COPD III spirometry if use FEV1/VC p saba  07/18/2010   Quit smoking May 2006       - PFT's  04/12/10 FEV1  1.21 (69%) ratio 77 and no change p B2,  DLC0 56%   VC 70%         -  PFTs  08/08/2013 FEV1 1.21 (60%) ratio 86 and no change p B2 DLCO 79%  VC 72%  On symbicort 160 2bid  - PFT's  02/08/2018  FEV1 0.70 (40 % ) ratio 56 if use FEV1/VC  p 38 % improvement from saba p symb 160 prior to study with DLCO  78 % corrects to 147  % for alv volume   - 02/08/2018  . Cough variant asthma 02/26/2011   Followed in Pulmonary clinic/ [facility]/ [facility]  - PFT's  06/04/15  FEV1 1.20 (67 % ) ratio 83  p 6 % improvement from saba with DLCO  80 % corrects to 132 % for alv volume      - Clinical dx based on response to symbicort       FENO 09/16/2016  =   96 on  symbicort 160 2bid > added singulair  Allergy profile 09/16/2016 >  Eos 0.5 /  IgE  78 neg RAST  -  Referred to rehab 04/29/2017 > completed  . Essential hypertension 04/20/2007   Qualifier: Diagnosis of  By: Marcelyn Ditty RN, Katy Fitch   . GERD (gastroesophageal reflux disease)   . History of ARDS 2006  . History of home oxygen therapy    AT NIGHT WHEN SLEEPING 2 L / MIN NASAL CANNULA  . Hyperlipidemia 07/12/2015  . Hypothyroidism   . Intracranial hemorrhage (HCC) 2019   a. small intracranial hemorrhage in setting of HTN.  . Mild carotid artery disease (HCC)    a. Duplex 1-39% bilaterally in 2016.  . Morbid (severe) obesity due to excess calories (HCC) 04/22/2015   pfts with erv 14% 06/04/15  And 33% 02/08/2018   . NSTEMI (non-ST elevated myocardial infarction) (HCC) 05/31/2015  . Pneumococcal pneumonia (HCC) 2006   HOSPITALIZED AND DEVELOPED ARDS  . Psoriatic arthritis (HCC)   . PULMONARY FIBROSIS ILD POST INFLAMMATORY CHRONIC 07/18/2010   Followed as Primary Care Patient/ [facility]  -s/p ARDS 2006 with bacteremic S  Pna       - CT chest 07/03/10 Nonspecific PF mostly upper lobes       - CT chest 12/03/10 acute gg changes and effusions c/w chf - PFT's  02/08/2018  FVC 0.64 (28 %)   with DLCO  78 % corrects to 147 % for alv volume     . Rheumatic disease   . 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 SOB (shortness of breath) on exertion    Past Surgical History:  Procedure Laterality Date  . ABDOMINOPLASTY    . CARDIAC CATHETERIZATION N/A 06/01/2015   Procedure: Left Heart Cath and Coronary Angiography;  Surgeon: Runell Gess, MD;  Location: [facility] INVASIVE CV LAB;  Service: Cardiovascular;  Laterality: N/A;  . CARDIOVERSION N/A 02/12/2021   Procedure: CARDIOVERSION;  Surgeon: Meriam Sprague, MD;  Location: [facility] ENDOSCOPY;  Service: Cardiovascular;  Laterality: N/A;  . CARPAL TUNNEL RELEASE    . CHOLECYSTECTOMY    . CORONARY ARTERY BYPASS GRAFT N/A 06/04/2015   Procedure: CORONARY ARTERY BYPASS GRAFT  times three            with left internal mammary artery and right leg saphenous vein;  Surgeon: Alleen Borne, MD;  Location: [facility] OR;  Service: Open Heart Surgery;  Laterality: N/A;  . cosmetic breast surgery    . JOINT REPLACEMENT    . KNEE ARTHROSCOPY Left   . TEE WITHOUT CARDIOVERSION  06/04/2015   Procedure: TRANSESOPHAGEAL ECHOCARDIOGRAM (TEE);  Surgeon: Alleen Borne, MD;  Location: [facility] OR;  Service: Open Heart Surgery;;  . TEE WITHOUT CARDIOVERSION N/A 02/12/2021   Procedure: TRANSESOPHAGEAL ECHOCARDIOGRAM (TEE);  Surgeon: Meriam Sprague, MD;  Location: [facility] ENDOSCOPY;  Service: Cardiovascular;  Laterality: N/A;  . TOTAL KNEE ARTHROPLASTY Left   . TOTAL KNEE ARTHROPLASTY Right 06/06/2013   Procedure: RIGHT TOTAL KNEE ARTHROPLASTY;  Surgeon: Loanne Drilling, MD;  Location: [facility] ORS;  Service: Orthopedics;  Laterality: Right;    Current Outpatient Medications  Medication Sig Dispense Refill  . acetaminophen (TYLENOL) 650 MG CR tablet Take 650 mg by mouth 2 (two) times daily.    Marland Kitchen albuterol (VENTOLIN HFA) 108 (90 Base) MCG/ACT inhaler Inhale 2 puffs into the lungs every 4 (four) hours as needed (shortness of breath, if you can't catch your breath). 8 g 5  . amoxicillin (AMOXIL) 500 MG tablet Take 1,000 mg by mouth See admin instructions. Dental procedure    . apixaban (ELIQUIS) 5 MG TABS tablet Take 1 tablet (5 mg total) by mouth 2 (two) times daily. 180 tablet 1  . atorvastatin (LIPITOR) 40 MG tablet TAKE 1 TABLET BY MOUTH  DAILY AT 6 PM (Patient taking differently: TAKE 1 TABLET BY MOUTH  DAILY AT 6 PM) 90 tablet 3  . budesonide (PULMICORT) 0.25 MG/2ML nebulizer solution One twice daily 120 mL 12  . CALCIUM PO Take 1 tablet by mouth daily.    . Cholecalciferol (VITAMIN D3) 2000 UNITS TABS Take 2,000 Int'l Units by mouth daily.    . citalopram (CELEXA) 40 MG tablet TAKE 1 TABLET BY MOUTH IN  THE MORNING 90 tablet 3  . cyclobenzaprine (FLEXERIL) 5 MG tablet TAKE 1 TABLET BY MOUTH AT   BEDTIME AS NEEDED FOR  MUSCLE SPASM(S) (Patient taking differently: TAKE 1 TABLET BY MOUTH AT  BEDTIME AS NEEDED FOR  MUSCLE SPASM(S)) 90 tablet 0  . diltiazem (CARDIZEM CD) 240 MG 24 hr capsule Take 1 capsule (240 mg total) by mouth daily. 90 capsule 1  . ferrous sulfate 325 (65 FE) MG tablet Take 1 tablet (325 mg total) by mouth daily with breakfast. 30 tablet 0  . formoterol (PERFOROMIST) 20 MCG/2ML nebulizer solution Take 2 mLs (20 mcg total) by nebulization 2 (two) times daily. Use in nebulizer twice daily perfectly regularly 120 mL 11  . golimumab (SIMPONI ARIA) 50 MG/4ML SOLN injection Inject 50 mg into the vein every 8 (eight) weeks.     . hydrocortisone 2.5 % cream Apply 1 application topically 2 (two) times daily as needed (itchiness).    Marland Kitchen ketoconazole (NIZORAL) 2 % cream Apply 1 application topically daily as needed for irritation.    Marland Kitchen leflunomide (ARAVA) 20 MG tablet Take 1 tablet (20 mg total) by mouth daily.    Marland Kitchen levothyroxine (SYNTHROID) 125 MCG tablet TAKE 1 TABLET BY MOUTH EVERY DAY 90 tablet 1  . losartan (COZAAR) 50 MG tablet Take 1 tablet (50 mg total) by mouth daily. 90 tablet 3  . montelukast (SINGULAIR) 10 MG tablet Take 1 tablet (10 mg total) by mouth at bedtime. 90 tablet 3  . OXYGEN Inhale 2 L into the lungs continuous. continuous o2    . pantoprazole (PROTONIX) 40 MG tablet Take 1 tablet (40 mg total) by mouth daily before breakfast. 90 tablet 3  . Tiotropium Bromide Monohydrate (SPIRIVA RESPIMAT) 2.5 MCG/ACT AERS USE 2 INHALATIONS BY MOUTH  EACH MORNING (Patient taking differently: USE 2 INHALATIONS BY MOUTH  EACH MORNING) 12 g 4  . torsemide (DEMADEX) 20 MG tablet Take  1 tablet (20 mg total) by mouth daily. 90 tablet 3  . traMADol (ULTRAM) 50 MG tablet Take 1 tablet (50 mg total) by mouth 2 (two) times daily. 60 tablet 1  . triamcinolone cream (KENALOG) 0.1 % Apply 1 application topically 2 (two) times daily as needed (for psoriasis).     . vitamin B-12  (CYANOCOBALAMIN) 1000 MCG tablet Take 1,000 mcg by mouth daily.     No current facility-administered medications for this encounter.    Allergies  Allergen Reactions  . Gabapentin Other (See Comments)    Dizziness, lighthead    Social History   Socioeconomic History  . Marital status: Widowed    Spouse name: Not on file  . Number of children: Not on file  . Years of education: Not on file  . Highest education level: Not on file  Occupational History  . Occupation: retired    Associate Professor: Designer, jewellery  Tobacco Use  . Smoking status: Former Smoker    Packs/day: 1.50    Years: 58.50    Pack years: 87.75    Types: Cigarettes    Quit date: 03/03/2005    Years since quitting: 15.9  . Smokeless tobacco: Never Used  Vaping Use  . Vaping Use: Never used  Substance and Sexual Activity  . Alcohol use: No  . Drug use: No  . Sexual activity: Not on file  Other Topics Concern  . Not on file  Social History Narrative  . Not on file   Social Determinants of Health   Financial Resource Strain: Low Risk   . Difficulty of Paying Living Expenses: Not hard at all  Food Insecurity: Not on file  Transportation Needs: No Transportation Needs  . Lack of Transportation (Medical): No  . Lack of Transportation (Non-Medical): No  Physical Activity: Not on file  Stress: Not on file  Social Connections: Not on file  Intimate Partner Violence: Not on file     ROS- All systems are reviewed and negative except as per the HPI above.  Physical Exam: Vitals:   02/14/21 1115  BP: 130/76  Pulse: 94  Weight: 90.6 kg  Height: 4\' 11"  (1.499 m)    GEN- The patient is a well appearing obese female, alert and oriented x 3 today.   HEENT-head normocephalic, atraumatic, sclera clear, conjunctiva pink, hearing intact, trachea midline. Lungs- Clear to ausculation bilaterally, diminished breath sounds, normal work of breathing Heart- Regular rate and rhythm, no murmurs, rubs or gallops  GI- soft,  NT, ND, + BS Extremities- no clubbing, cyanosis, or edema MS- no significant deformity or atrophy Skin- no rash or lesion Psych- euthymic mood, full affect Neuro- strength and sensation are intact   Wt Readings from Last 3 Encounters:  02/14/21 90.6 kg  02/12/21 89.4 kg  02/05/21 93.9 kg    EKG today demonstrates  SR Vent. rate 94 BPM PR interval 180 ms QRS duration 92 ms QT/QTcB 370/462 ms  Echo 12/27/20 demonstrated  1. Left ventricular ejection fraction, by estimation, is 60 to 65%. The  left ventricle has normal function. The left ventricle has no regional  wall motion abnormalities. There is severe left ventricular hypertrophy.  Left ventricular diastolic parameters  are indeterminate.  2. Right ventricular systolic function is normal. The right ventricular  size is normal.  3. Left atrial size was moderately dilated.  4. The mitral valve is normal in structure. No evidence of mitral valve  regurgitation. No evidence of mitral stenosis. Moderate mitral annular  calcification.  5. The aortic valve is normal in structure. Aortic valve regurgitation is  not visualized. No aortic stenosis is present.  6. The inferior vena cava is dilated in size with <50% respiratory  variability, suggesting right atrial pressure of 15 mmHg.  Epic records are reviewed at length today  CHA2DS2-VASc Score = 7  The patient's score is based upon: CHF History: Yes HTN History: Yes Diabetes History: No Stroke History: Yes Vascular Disease History: Yes Age Score: 1 Gender Score: 1      ASSESSMENT AND PLAN: 1. Persistent atrial fibrillation/Atrial flutter The patient's CHA2DS2-VASc score is 7, indicating a 11.2% annual risk of stroke.   S/p TEE/DCCV on 02/12/21. Patient agreeable to dofetilide admission. There are really no other rhythm options.  Patient to check on price of dofetilide. PharmD has screened medications. Patient will discuss changing citalopram to alternate  antidepressant, likely Cymbalta.  QTc in SR 447-462 ms Continue Eliquis 5 mg BID Continue diltiazem 240 mg daily  2. Secondary Hypercoagulable State (ICD10:  D68.69) The patient is at significant risk for stroke/thromboembolism based upon her CHA2DS2-VASc Score of 7.  Continue Apixaban (Eliquis).   3. Obesity Body mass index is 40.35 kg/m. Lifestyle modification was discussed and encouraged including regular physical activity and weight reduction.  4. CAD S/p CABG No anginal symptoms.  5. HTN Stable, no changes today.  6. Chronic diastolic CHF Her weight is down today and she notes much less leg and abdominal swelling.  Maintaining SR will be important.    Follow up in the AF clinic for dofetilide admission after changing SSRI.   Jorja Loa PA-C [facility] [address] [place], [place] [address] [phone] 02/14/2021 11:23 AM

## 2021-02-14 NOTE — Patient Instructions (Signed)
Dates currently available for admission (subject to change) - May 9th, May 23rd, May 24th, May 31st.  Cymbalta (duloxetine)

## 2021-02-14 NOTE — Telephone Encounter (Signed)
Would you like a virtual/office visit? 

## 2021-02-14 NOTE — Telephone Encounter (Signed)
Spoke with daughter and an appointment scheduled. 

## 2021-02-14 NOTE — Telephone Encounter (Signed)
Patient's daughter Cala Bradford would like to know if her mother's depression medicine can be changed.  Please advise.

## 2021-02-15 DIAGNOSIS — I501 Left ventricular failure: Secondary | ICD-10-CM | POA: Diagnosis not present

## 2021-02-15 DIAGNOSIS — D631 Anemia in chronic kidney disease: Secondary | ICD-10-CM | POA: Diagnosis not present

## 2021-02-15 DIAGNOSIS — I130 Hypertensive heart and chronic kidney disease with heart failure and stage 1 through stage 4 chronic kidney disease, or unspecified chronic kidney disease: Secondary | ICD-10-CM | POA: Diagnosis not present

## 2021-02-15 DIAGNOSIS — I5033 Acute on chronic diastolic (congestive) heart failure: Secondary | ICD-10-CM | POA: Diagnosis not present

## 2021-02-15 DIAGNOSIS — J441 Chronic obstructive pulmonary disease with (acute) exacerbation: Secondary | ICD-10-CM | POA: Diagnosis not present

## 2021-02-15 DIAGNOSIS — N1831 Chronic kidney disease, stage 3a: Secondary | ICD-10-CM | POA: Diagnosis not present

## 2021-02-18 ENCOUNTER — Other Ambulatory Visit: Payer: Self-pay

## 2021-02-19 ENCOUNTER — Ambulatory Visit (HOSPITAL_COMMUNITY): Payer: Medicare Other | Admitting: Physician Assistant

## 2021-02-19 ENCOUNTER — Ambulatory Visit (INDEPENDENT_AMBULATORY_CARE_PROVIDER_SITE_OTHER): Payer: Medicare Other | Admitting: Internal Medicine

## 2021-02-19 ENCOUNTER — Encounter: Payer: Self-pay | Admitting: Internal Medicine

## 2021-02-19 VITALS — BP 110/70 | HR 101 | Temp 98.9°F

## 2021-02-19 DIAGNOSIS — I10 Essential (primary) hypertension: Secondary | ICD-10-CM

## 2021-02-19 DIAGNOSIS — M5431 Sciatica, right side: Secondary | ICD-10-CM

## 2021-02-19 DIAGNOSIS — J9612 Chronic respiratory failure with hypercapnia: Secondary | ICD-10-CM

## 2021-02-19 DIAGNOSIS — E039 Hypothyroidism, unspecified: Secondary | ICD-10-CM | POA: Diagnosis not present

## 2021-02-19 DIAGNOSIS — J9611 Chronic respiratory failure with hypoxia: Secondary | ICD-10-CM | POA: Diagnosis not present

## 2021-02-19 DIAGNOSIS — F332 Major depressive disorder, recurrent severe without psychotic features: Secondary | ICD-10-CM | POA: Diagnosis not present

## 2021-02-19 DIAGNOSIS — B372 Candidiasis of skin and nail: Secondary | ICD-10-CM

## 2021-02-19 DIAGNOSIS — J441 Chronic obstructive pulmonary disease with (acute) exacerbation: Secondary | ICD-10-CM

## 2021-02-19 MED ORDER — NYSTATIN 100000 UNIT/GM EX POWD: 1.0000 "application " | Freq: Three times a day (TID) | CUTANEOUS | 0 refills | Status: DC

## 2021-02-19 MED ORDER — DULOXETINE HCL 20 MG PO CPEP: 20.0000 mg | ORAL_CAPSULE | Freq: Every day | ORAL | 1 refills | Status: DC

## 2021-02-19 NOTE — Progress Notes (Signed)
Established Patient Office Visit     This visit occurred during the SARS-CoV-2 public health emergency.  Safety protocols were in place, including screening questions prior to the visit, additional usage of staff PPE, and extensive cleaning of exam room while observing appropriate contact time as indicated for disinfecting solutions.    CC/Reason for Visit: Discuss some acute concerns  HPI: [name] is a 75 y.o. female who is coming in today for the above mentioned reasons. 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  Depression that has been well controlled for years on Celexa.  GERD, hypothyroidism and is iatrogenic following radiation ablation for Graves' disease on levothyroxine.  Also has hypertension.  She was most recently diagnosed with atrial fibrillation and is now on Eliquis.  Reason for call  1.  (Her cardiologist wants to start her on a new medication for A. fib but she was told that it interacted with her Celexa but this could be transitioned over to Cymbalta.  2.  She has been having a yeast infection of her groin.  She has used nystatin powder in the past for this successfully and is requesting refills today.  3.  She wants to update me on her back issues.  She is in a wheelchair today, I have never seen her in a wheelchair in the past.  She tells me she is having severe back pain radiating mostly down her left leg.  She has been under the care of Dr. Darrelyn Hillock and Dr. Horald Chestnut.  She has had what sounds like a couple of epidural injections.  She tells me that she had x-rays and MRIs and back surgery was recommended but she would not be a candidate due to her advanced COPD.  She has physical therapy.  She did not tolerate gabapentin in the past.   Past Medical/Surgical History: Past Medical History:  Diagnosis Date  . Arthritis    OA RIGHT KNEE WITH PAIN  . Barrett esophagus    . Bradycardia 06/01/2015  . CAD in native artery    a. NSTEMI 05/2015 s/p emergent CABG.  . Chronic diastolic (congestive) heart failure (HCC)   . Chronic kidney disease, stage 3a (HCC)   . Chronic respiratory failure (HCC)   . Chronic respiratory failure with hypoxia and hypercapnia (HCC) 02/04/2010   Followed in Pulmonary clinic/ [facility]/ [facility]       - 02 dependent  since 07/02/10 >>  83% RA December 05, 2010       - ONO RA 08/05/12  :  Positive sat < 89 x 2:65m> repeat on 2lpm rec 08/12/2012  - 06/17/2013 reported desat with activity p Knee surgery > rec restart 2lpm with activity  - 06/27/2013   Walked 2lpm  x one lap @ 185 stopped due to sat 88% not sob , desat to 82% on RA just at th  . COPD III spirometry if use FEV1/VC p saba  07/18/2010   Quit smoking May 2006       - PFT's  04/12/10 FEV1  1.21 (69%) ratio 77 and no change p B2,  DLC0 56%   VC 70%         - PFTs  08/08/2013 FEV1 1.21 (60%) ratio 86 and no change p B2 DLCO 79%  VC 72%  On symbicort 160 2bid  - PFT's  02/08/2018  FEV1 0.70 (40 % ) ratio 56 if use FEV1/VC  p 38 % improvement from saba p symb 160 prior to study with DLCO  78 % corrects to 147  % for alv volume   - 02/08/2018  . Cough variant asthma 02/26/2011   Followed in Pulmonary clinic/ [facility]/ [facility]  - PFT's  06/04/15  FEV1 1.20 (67 % ) ratio 83  p 6 % improvement from saba with DLCO  80 % corrects to 132 % for alv volume      - Clinical dx based on response to symbicort       FENO 09/16/2016  =   96 on symbicort 160 2bid > added singulair  Allergy profile 09/16/2016 >  Eos 0.5 /  IgE  78 neg RAST  -  Referred to rehab 04/29/2017 > completed  . Essential hypertension 04/20/2007   Qualifier: Diagnosis of  By: Marcelyn Ditty RN, Katy Fitch   . GERD (gastroesophageal reflux disease)   . History of ARDS 2006  . History of home oxygen therapy    AT NIGHT WHEN SLEEPING 2 L / MIN NASAL CANNULA  . Hyperlipidemia 07/12/2015  . Hypothyroidism   . Intracranial hemorrhage (HCC) 2019   a.  small intracranial hemorrhage in setting of HTN.  . Mild carotid artery disease (HCC)    a. Duplex 1-39% bilaterally in 2016.  . Morbid (severe) obesity due to excess calories (HCC) 04/22/2015   pfts with erv 14% 06/04/15  And 33% 02/08/2018   . NSTEMI (non-ST elevated myocardial infarction) (HCC) 05/31/2015  . Pneumococcal pneumonia (HCC) 2006   HOSPITALIZED AND DEVELOPED ARDS  . Psoriatic arthritis (HCC)   . PULMONARY FIBROSIS ILD POST INFLAMMATORY CHRONIC 07/18/2010   Followed as Primary Care Patient/ [facility]/ [facility]  -s/p ARDS 2006 with bacteremic S  Pna       - CT chest 07/03/10 Nonspecific PF mostly upper lobes       - CT chest 12/03/10 acute gg changes and effusions c/w chf - PFT's  02/08/2018  FVC 0.64 (28 %)   with DLCO  78 % corrects to 147 % for alv volume     . Rheumatic disease   . 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 SOB (shortness of breath) on exertion     Past Surgical History:  Procedure Laterality Date  . ABDOMINOPLASTY    . CARDIAC CATHETERIZATION N/A 06/01/2015   Procedure: Left Heart Cath and Coronary Angiography;  Surgeon: Runell Gess, MD;  Location: [facility] INVASIVE CV LAB;  Service: Cardiovascular;  Laterality: N/A;  . CARDIOVERSION N/A 02/12/2021   Procedure: CARDIOVERSION;  Surgeon: Meriam Sprague, MD;  Location: [facility] ENDOSCOPY;  Service: Cardiovascular;  Laterality: N/A;  . CARPAL TUNNEL RELEASE    . CHOLECYSTECTOMY    . CORONARY ARTERY BYPASS GRAFT N/A 06/04/2015   Procedure: CORONARY ARTERY BYPASS GRAFT times three            with left internal mammary artery and right leg saphenous vein;  Surgeon: Alleen Borne, MD;  Location: [facility] OR;  Service: Open Heart Surgery;  Laterality: N/A;  . cosmetic breast surgery    . JOINT REPLACEMENT    . KNEE ARTHROSCOPY Left   . TEE WITHOUT CARDIOVERSION  06/04/2015   Procedure: TRANSESOPHAGEAL ECHOCARDIOGRAM (TEE);  Surgeon: Alleen Borne, MD;  Location: [facility] OR;  Service: Open Heart Surgery;;  . TEE WITHOUT CARDIOVERSION N/A 02/12/2021    Procedure: TRANSESOPHAGEAL ECHOCARDIOGRAM (TEE);  Surgeon: Meriam Sprague, MD;  Location: [facility] ENDOSCOPY;  Service: Cardiovascular;  Laterality: N/A;  .  TOTAL KNEE ARTHROPLASTY Left   . TOTAL KNEE ARTHROPLASTY Right 06/06/2013   Procedure: RIGHT TOTAL KNEE ARTHROPLASTY;  Surgeon: Loanne Drilling, MD;  Location: [facility] ORS;  Service: Orthopedics;  Laterality: Right;    Social History:  reports that she quit smoking about 15 years ago. Her smoking use included cigarettes. She has a 87.75 pack-year smoking history. She has never used smokeless tobacco. She reports that she does not drink alcohol and does not use drugs.  Allergies: Allergies  Allergen Reactions  . Gabapentin Other (See Comments)    Dizziness, lighthead    Family History:  Family History  Problem Relation Age of Onset  . Breast cancer Mother   . Coronary artery disease Father   . Rheum arthritis Father      Current Outpatient Medications:  .  acetaminophen (TYLENOL) 650 MG CR tablet, Take 650 mg by mouth 2 (two) times daily., Disp: , Rfl:  .  albuterol (VENTOLIN HFA) 108 (90 Base) MCG/ACT inhaler, Inhale 2 puffs into the lungs every 4 (four) hours as needed (shortness of breath, if you can't catch your breath)., Disp: 8 g, Rfl: 5 .  amoxicillin (AMOXIL) 500 MG tablet, Take 1,000 mg by mouth See admin instructions. Dental procedure, Disp: , Rfl:  .  apixaban (ELIQUIS) 5 MG TABS tablet, Take 1 tablet (5 mg total) by mouth 2 (two) times daily., Disp: 180 tablet, Rfl: 1 .  atorvastatin (LIPITOR) 40 MG tablet, TAKE 1 TABLET BY MOUTH  DAILY AT 6 PM (Patient taking differently: TAKE 1 TABLET BY MOUTH  DAILY AT 6 PM), Disp: 90 tablet, Rfl: 3 .  budesonide (PULMICORT) 0.25 MG/2ML nebulizer solution, One twice daily, Disp: 120 mL, Rfl: 12 .  CALCIUM PO, Take 1 tablet by mouth daily., Disp: , Rfl:  .  Cholecalciferol (VITAMIN D3) 2000 UNITS TABS, Take 2,000 Int'l Units by mouth daily., Disp: , Rfl:  .  cyclobenzaprine (FLEXERIL) 5  MG tablet, TAKE 1 TABLET BY MOUTH AT  BEDTIME AS NEEDED FOR  MUSCLE SPASM(S) (Patient taking differently: TAKE 1 TABLET BY MOUTH AT  BEDTIME AS NEEDED FOR  MUSCLE SPASM(S)), Disp: 90 tablet, Rfl: 0 .  diltiazem (CARDIZEM CD) 240 MG 24 hr capsule, Take 1 capsule (240 mg total) by mouth daily., Disp: 90 capsule, Rfl: 1 .  DULoxetine (CYMBALTA) 20 MG capsule, Take 1 capsule (20 mg total) by mouth daily., Disp: 90 capsule, Rfl: 1 .  formoterol (PERFOROMIST) 20 MCG/2ML nebulizer solution, Take 2 mLs (20 mcg total) by nebulization 2 (two) times daily. Use in nebulizer twice daily perfectly regularly, Disp: 120 mL, Rfl: 11 .  golimumab (SIMPONI ARIA) 50 MG/4ML SOLN injection, Inject 50 mg into the vein every 8 (eight) weeks. , Disp: , Rfl:  .  hydrocortisone 2.5 % cream, Apply 1 application topically 2 (two) times daily as needed (itchiness)., Disp: , Rfl:  .  ketoconazole (NIZORAL) 2 % cream, Apply 1 application topically daily as needed for irritation., Disp: , Rfl:  .  leflunomide (ARAVA) 20 MG tablet, Take 1 tablet (20 mg total) by mouth daily., Disp: , Rfl:  .  levothyroxine (SYNTHROID) 125 MCG tablet, TAKE 1 TABLET BY MOUTH EVERY DAY, Disp: 90 tablet, Rfl: 1 .  losartan (COZAAR) 50 MG tablet, Take 1 tablet (50 mg total) by mouth daily., Disp: 90 tablet, Rfl: 3 .  montelukast (SINGULAIR) 10 MG tablet, Take 1 tablet (10 mg total) by mouth at bedtime., Disp: 90 tablet, Rfl: 3 .  nystatin (MYCOSTATIN/NYSTOP) powder, Apply  1 application topically 3 (three) times daily., Disp: 15 g, Rfl: 0 .  OXYGEN, Inhale 2 L into the lungs continuous. continuous o2, Disp: , Rfl:  .  pantoprazole (PROTONIX) 40 MG tablet, Take 1 tablet (40 mg total) by mouth daily before breakfast., Disp: 90 tablet, Rfl: 3 .  Tiotropium Bromide Monohydrate (SPIRIVA RESPIMAT) 2.5 MCG/ACT AERS, USE 2 INHALATIONS BY MOUTH  EACH MORNING (Patient taking differently: USE 2 INHALATIONS BY MOUTH  EACH MORNING), Disp: 12 g, Rfl: 4 .  torsemide  (DEMADEX) 20 MG tablet, Take 1 tablet (20 mg total) by mouth daily., Disp: 90 tablet, Rfl: 3 .  traMADol (ULTRAM) 50 MG tablet, Take 1 tablet (50 mg total) by mouth 2 (two) times daily., Disp: 60 tablet, Rfl: 1 .  triamcinolone cream (KENALOG) 0.1 %, Apply 1 application topically 2 (two) times daily as needed (for psoriasis). , Disp: , Rfl:  .  vitamin B-12 (CYANOCOBALAMIN) 1000 MCG tablet, Take 1,000 mcg by mouth daily., Disp: , Rfl:  .  ferrous sulfate 325 (65 FE) MG tablet, Take 1 tablet (325 mg total) by mouth daily with breakfast., Disp: 30 tablet, Rfl: 0  Review of Systems:  Constitutional: Denies fever, chills, diaphoresis, appetite change and fatigue.  HEENT: Denies photophobia, eye pain, redness, hearing loss, ear pain, congestion, sore throat, rhinorrhea, sneezing, mouth sores, trouble swallowing, neck pain, neck stiffness and tinnitus.   Respiratory: Denies SOB or DOE above her baseline, cough, chest tightness,  and wheezing.   Cardiovascular: Denies chest pain, palpitations and leg swelling.  Gastrointestinal: Denies nausea, vomiting, abdominal pain, diarrhea, constipation, blood in stool and abdominal distention.  Genitourinary: Denies dysuria, urgency, frequency, hematuria, flank pain and difficulty urinating.  Endocrine: Denies: hot or cold intolerance, sweats, changes in hair or nails, polyuria, polydipsia. Musculoskeletal: Positive for myalgias, back pain, joint swelling, arthralgias and gait problem.  Skin: Denies pallor, rash and wound.  Neurological: Denies dizziness, seizures, syncope, weakness, light-headedness, numbness and headaches.  Hematological: Denies adenopathy. Easy bruising, personal or family bleeding history  Psychiatric/Behavioral: Denies suicidal ideation, mood changes, confusion, nervousness, sleep disturbance and agitation    Physical Exam: Vitals:   02/19/21 1510  BP: 110/70  Pulse: (!) 101  Temp: 98.9 F (37.2 C)  TempSrc: Oral  SpO2: 94%     There is no height or weight on file to calculate BMI.   Constitutional: NAD, calm, comfortable, obese, in wheelchair Eyes: PERRL, lids and conjunctivae normal ENMT: Mucous membranes are moist. Respiratory: clear to auscultation bilaterally, no wheezing, no crackles. Normal respiratory effort. No accessory muscle use.  Cardiovascular: Regular rate and rhythm, no murmurs / rubs / gallops. No extremity edema.  Psychiatric: Normal judgment and insight. Alert and oriented x 3. Normal mood.    Impression and Plan:  Sciatica of left side  - Plan: Vitamin B12, VITAMIN D 25 Hydroxy (Vit-D Deficiency, Fractures), VITAMIN D 25 Hydroxy (Vit-D Deficiency, Fractures), Vitamin B12 -Likely all related to her back issues.  Advised that she continue under the care of orthopedist and pain management specialist.  Also important to continue physical therapy and weight loss. -She is agreeable to a trial of gabapentin again in the future.  Essential hypertension -Well-controlled.  Hypothyroidism, unspecified type -On levothyroxine, most recent TSH was normal at 0.752 in February.  COPD  (HCC) Chronic respiratory failure with hypoxia and hypercapnia (HCC) -At baseline, not currently exacerbated.  Yeast infection of the skin  - Plan: nystatin (MYCOSTATIN/NYSTOP) powder to apply 2-3 times daily to affected areas of  the inguinal folds.  Severe episode of recurrent major depressive disorder, without psychotic features (HCC) -I believe it is reasonable to switch Celexa over to Cymbalta per cardiology request.  She will take her last dose of Celexa today and transition over to Cymbalta 20 mg starting tomorrow morning.    Chaya Jan, MD [facility]

## 2021-02-20 ENCOUNTER — Telehealth: Payer: Medicare Other

## 2021-02-20 LAB — VITAMIN B12: Vitamin B-12: >1506 pg/mL — ABNORMAL HIGH (ref 211–911)

## 2021-02-20 LAB — VITAMIN D 25 HYDROXY (VIT D DEFICIENCY, FRACTURES): VITD: 49.11 ng/mL (ref 30.00–100.00)

## 2021-02-21 ENCOUNTER — Encounter (HOSPITAL_COMMUNITY): Payer: Self-pay

## 2021-02-21 DIAGNOSIS — D631 Anemia in chronic kidney disease: Secondary | ICD-10-CM | POA: Diagnosis not present

## 2021-02-21 DIAGNOSIS — J441 Chronic obstructive pulmonary disease with (acute) exacerbation: Secondary | ICD-10-CM | POA: Diagnosis not present

## 2021-02-21 DIAGNOSIS — I501 Left ventricular failure: Secondary | ICD-10-CM | POA: Diagnosis not present

## 2021-02-21 DIAGNOSIS — N1831 Chronic kidney disease, stage 3a: Secondary | ICD-10-CM | POA: Diagnosis not present

## 2021-02-21 DIAGNOSIS — I130 Hypertensive heart and chronic kidney disease with heart failure and stage 1 through stage 4 chronic kidney disease, or unspecified chronic kidney disease: Secondary | ICD-10-CM | POA: Diagnosis not present

## 2021-02-21 DIAGNOSIS — I5033 Acute on chronic diastolic (congestive) heart failure: Secondary | ICD-10-CM | POA: Diagnosis not present

## 2021-02-21 NOTE — Telephone Encounter (Signed)
Pt PCP switched to cymbalta from celexa.

## 2021-02-26 ENCOUNTER — Telehealth: Payer: Self-pay | Admitting: Internal Medicine

## 2021-02-26 DIAGNOSIS — D631 Anemia in chronic kidney disease: Secondary | ICD-10-CM | POA: Diagnosis not present

## 2021-02-26 DIAGNOSIS — J441 Chronic obstructive pulmonary disease with (acute) exacerbation: Secondary | ICD-10-CM | POA: Diagnosis not present

## 2021-02-26 DIAGNOSIS — I5033 Acute on chronic diastolic (congestive) heart failure: Secondary | ICD-10-CM | POA: Diagnosis not present

## 2021-02-26 DIAGNOSIS — N1831 Chronic kidney disease, stage 3a: Secondary | ICD-10-CM | POA: Diagnosis not present

## 2021-02-26 DIAGNOSIS — I130 Hypertensive heart and chronic kidney disease with heart failure and stage 1 through stage 4 chronic kidney disease, or unspecified chronic kidney disease: Secondary | ICD-10-CM | POA: Diagnosis not present

## 2021-02-26 DIAGNOSIS — I501 Left ventricular failure: Secondary | ICD-10-CM | POA: Diagnosis not present

## 2021-02-26 NOTE — Telephone Encounter (Signed)
I called and spoke with pts daughter and she stated that the back doctor that the pt has been seeing told her that she is not a candidate for the back surgery that she needs due to her lung issues.  The pts daughter stated that she wanted to know what MW thoughts are on her having a back surgery.  DOES MW feel that the pt would make it through the surgery?  Should she not have the surgery?  What is the risk of her having the surgery?  MW knows her history and she would feel better hearing that from [facility].  Please advise. Thanks

## 2021-02-27 NOTE — Telephone Encounter (Signed)
Spoke with the pt's daughter and notified of response per MW. She verbalized understanding. Appt with MW for televisit 03/01/21 at 10 am.

## 2021-02-27 NOTE — Telephone Encounter (Signed)
Set up televisit when the daughter can be on speaker phone with the pt and I will address this then - it's not an easy decision either way

## 2021-03-01 ENCOUNTER — Encounter: Payer: Self-pay | Admitting: Internal Medicine

## 2021-03-01 ENCOUNTER — Ambulatory Visit (INDEPENDENT_AMBULATORY_CARE_PROVIDER_SITE_OTHER): Payer: Medicare Other | Admitting: Internal Medicine

## 2021-03-01 ENCOUNTER — Other Ambulatory Visit: Payer: Self-pay

## 2021-03-01 DIAGNOSIS — J449 Chronic obstructive pulmonary disease, unspecified: Secondary | ICD-10-CM | POA: Diagnosis not present

## 2021-03-01 DIAGNOSIS — J9612 Chronic respiratory failure with hypercapnia: Secondary | ICD-10-CM

## 2021-03-01 DIAGNOSIS — J9611 Chronic respiratory failure with hypoxia: Secondary | ICD-10-CM

## 2021-03-01 NOTE — Assessment & Plan Note (Signed)
Quit smoking May 2006       - PFT's  04/12/10 FEV1  1.21 (69%) ratio 77 and no change p B2,  DLC0 56%   VC 70%         - PFTs  08/08/2013 FEV1 1.21 (60%) ratio 86 and no change p B2 DLCO 79%  VC 72%  On symbicort 160 2bid  - PFT's  02/08/2018  FEV1 0.70 (40 % ) ratio 56 if use FEV1/VC  p 38 % improvement from saba p symb 160 prior to study with DLCO  78 % corrects to 147  % for alv volume   - 02/08/2018  After extensive coaching inhaler device  effectiveness =    75% with mdi and 90% with dpi so change to Trelegy > no change from sample so rec resume symb 160 2 bid as of 03/24/2018  - 04/18/2019    changed spiriva to smi  11/08/2019  After extensive coaching inhaler device,  effectiveness =    75% (short Ti) > continue symb / spiriva  - 11/08/2019 added Plan D = Deltasone x 6 days for flares  - 07/02/2020 change symbicort to  Budesonide and formoterol and continue spriva    Group D in terms of symptom/risk and laba/lama/ICS  therefore appropriate rx at this point >>>  Continue bud/formoterol  And spiriva

## 2021-03-01 NOTE — Progress Notes (Signed)
Subjective:   Patient ID: [name], female    DOB: Mar 17, 1946    MRN: [id number]   Brief patient profile:  75  yowf  quit smoking in May 2006 with dx pna/ ards resumed full activity including yardwork at wt 140 and pft's c/w restrictive changes 04/2010 at wt 170 but reported improvement on saba so maintained chronically on symbicort 160  With pfts 02/08/18  c/w GOLD III copd if use FEV1/VC ratio    History of Present Illness  July 18, 2010  1st pulmonary office eval in ER era c/o doe x 6 months progressive indolent onset with copd vs pf.  Ultimately required hosp 8/30-9/1 dx copd/pf ? cause on sulfasalzine and ACE inhib better on 02 and advair but very hoarse with sense of chest congestion and cough with white mucus day > night.  doe x > slow adls.stop advair Start symbicort 160 2 puffs first thing  in am and 2 puffs again in pm about 12 hours later  Work on inhaler technique:   stop benazapril start benicar 40/25  one daily Use 02 sleeping and any activity other than sitting still   75/06/2014 f/u ov/[name] re: restrictive lung dz/ RA/psoriatric/ symbicort 160 2bid  Chief Complaint  Patient presents with  . Follow-up    Pt states was advised to f/u per [employer].  Pt has been having increased SOB- relates to stress from dealing with her spouse's illness. She is using o2 pretty much 24/7.   trying to do more than baseline activity and finds she needs 02 more. rec Work on inhaler technique:   Ok to just use the symbicort 160 2 puffs in am to see what difference if any this makes in your breathing  Change 02 to 2lpm 24/7 and use 3lpm with activity    06/04/15  cabg      75/06/2018  f/u ov/Ramesh Moan re: AB / pf related to ALI vs RA / MO  Chief Complaint  Patient presents with  . Follow-up    Breathing has been worse over the past 1-2 months. She states she gets winded just walking from her house to her car. She has been using her albuterol inhaler 4 x daily on average.   on 2lpm at rest and  sleeping needed up to 4lpm continous while doing rehab  but only has pulsed to 3lpm at home per [employer] Much better only while on prednisone / poor hfa noted  Doe = MMRC3 = can't walk 100 yards even at a slow pace at a flat grade s stopping due to sob  Even on 02 but not sure about sats as does not check rec Goal is to keep sats above 90%  - call if you want [employer] to re-evaluate your ambulatory needs  Prednisone 10 mg take  4 each am x 2 days,   2 each am x 2 days,  1 each am x 2 days and stop  Work on inhaler technique:      04/05/18  aecopd > pred x 6 days > back to baseline      75/07/2018  f/u ov/[name] re:  COPD GOLD III/ 02 dep / maint on just symb 160 2bid (no better with incruse) Chief Complaint  Patient presents with  . Follow-up    Breathing is the same. She is using her albuterol inhaler once per wk on average.   Dyspnea:  HT, sometimes [employer] on 3lpm MMRC3 = can't walk 100 yards even at a slow  pace at a flat grade s stopping due to sob   Cough: none Sleeping: on back books under hob plus pillows = 30 degrees  SABA use: rare  02: 2lpm but turns to 3lpm poc with activity    rec Prednisone 10 mg take  4 each am x 2 days,   2 each am x 2 days,  1 each am x 2 days and stop  Work on inhaler technique:   Please schedule a follow up visit in 4 months but call sooner if needed  - add consider adding spiriva next ov if not doing better and insurance will cover   75/15/2020  f/u ov/[name] re: GOLD III spirometry but 02 dep/ maint on symb 160 2bid/spiriva  Chief Complaint  Patient presents with  . Follow-up    Breathing not improving since the last visit. She states she has not been active at all. She has been using her albuterol daily.   Dyspnea:  No longer shopping due to covid 19 does delivery only  Cough: occ spells/ non productive never noct   Sleeping: on elevated to 30 degrees  SABA use: daily use of nebulizer  02: 2lpm but 3lpm with activity  rec Plan A = Automatic =Symbicort  160 x 2 pffs each am followed by spriiva 2 puffs and 12 hours later symbicort 160 x 2  Work on inhaler technique: Plan B = Backup Only use your albuterol(proventil) inhaler as a rescue medication  Plan C = Crisis - only use your albuterol nebulizer if you first try Plan B and it fails to help > ok to use the nebulizer up to every 4 hours but if start needing it regularly call for immediate appointment    06/14/2019  f/u ov/[name] re:  GOLD III spirometry but 02 dep 24/7/ maint on symb 160/spiriva [name] Chief Complaint  Patient presents with  . Follow-up    Breathing is overall doing about the same. She is using her albuterol once per wk on average.   Dyspnea:  room to room now on up to 3lpm with sats ok but feels losing ground steadily since prior ov in terms of activity tol/ housebound mostly at this poit   Cough: some spells daytime, not noct, mostly mucoid production Sleeping: 30 degrees elevation helps some SABA use: rarely hfa/ never neb at this point  02: 2lpm bedtime up to 3lpm with activity  rec Prednisone 10 mg Take 4 for three days 3 for three days 2 for three days 1 for three days and stop  No change in medications    07/15/2019  f/u ov/[name] re: copd  GOLD III spirometry / 02 dep maint on symb/spiriva/singulair  Chief Complaint  Patient presents with  . COPD III spirometry if use FEV1/VC p saba    Doing better since last visit with the combination of Symbicort and Spiriva   Dyspnea:  Up to 5 min on street x 3lpm = MMRC3 = can't walk 100 yards even at a slow pace at a flat grade s stopping due to sob  But not checking sats at peak ex  Cough: not now  Sleeping: 30 degrees hob / no am flares of cough/ wheeze/sob   SABA use: none 02: 2lpm hs, 2lpm resting, 3lpm vacuuming or walking outside 3lpm POC  rec Work on inhaler technique:    No change in medications or 02     11/08/2019  f/u ov/[name] re: GOLD III spirom but 02 dep  Chief Complaint  Patient presents with  .  Follow-up    Pt states shes been feeling about the same. Not as active. Coughs a little bit with "yellowish tan" phlegm. Denies any fever or chills.   Dyspnea: MMRC3 = can't walk 100 yards even at a slow pace at a flat grade s stopping due to sob   Cough: min rattling  Sleeping: 30 degrees hob  SABA use: much less  02: 2lpm hs, 2lpm resting, 3lpm vacuuming or walking outside 3lpm POC  rec Plan A = Automatic =Symbicort 160 x 2 pffs each am followed by spriiva 2 puffs and 12 hours later symbicort 160 x 2  Work on inhaler technique  Plan B = Backup Only use your albuterol(proventil) inhaler as a rescue medication  Plan C = Crisis - only use your albuterol nebulizer if you first try Plan B and it fails to help > ok to use the nebulizer up to every 4 hours but if start needing it regularly call for immediate appointment Plan D = Deltasone  If plan C not working well, add Prednisone 10 mg take  4 each am x 2 days,   2 each am x 2 days,  1 each am x 2 days and stop    07/02/2020  f/u ov/Senetra Dillin re: GOLD III/ 02 dep maint on symb/spriva and freq pred  Chief Complaint  Patient presents with  . Follow-up    Gets extremely sob with activity  Dyspnea:  Across the room baseline / down the drive today did  better flat grade/ always better p prednisone   Cough: none  Sleeping: electric bed  30 degrees  SABA use: rarely  02:  3lpm 24/7  rec Plan A = Automatic =  spriiva 2 puffs each am (work on practice inhalations Instead of symbicort =  Budesonide and formoterol first thing in am and 12 hours later  in nebulizer Plan B = Backup Only use your albuterol(proventil) inhaler as a rescue medication Plan C = Crisis - only use your albuterol nebulizer if you first try Plan B and it fails to help > ok to use the nebulizer up to every 4 hours but if start needing it regularly call for immediate appointment Plan D = Deltasone  If plan C not working well, add Prednisone 10 mg take  4 each am x 2 days,   2 each  am x 2 days,  1 each am x 2 days and stop    Admit date: 12/27/2020 Discharge date: 12/31/2020   Recommendations for Outpatient Follow-up:  1. Follow up with PCP in 1-2 weeks 2. Follow-up with cardiology in 2 weeks 3. Follow-up with pulmonology in 1 week 4. Please arrange outpatient sleep study 5. Please obtain BMP/CBC in one week 6. 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 fib on Eliquis, CKD-3, psoriatic arthritis, sciatica, recent COVID-19 infection presented with progressive shortness of breath, near syncope and RLE radiculopathic pain, and admitted for acute on chronic hypoxic respiratory failure and near syncope in the setting of acute on chronic diastolic CHF, possible COPD exacerbation and anemia. She was a started on IV Lasix, BiPAP, antibiotics and nebulizers. Cardiology consulted on admission and evaluated patient. PCCM consulted and added steroid.  She was subsequently weaned  down and brought back to her 3 L of oxygen which is her baseline.  Subsequently her IV Lasix was transitioned to oral torsemide 20 mg p.o. daily by cardiology.  She was initially transitioned from long-acting to short-acting oral Cardizem but subsequently flipped back into sinus rhythm and cardiology switched her back to long-acting Cardizem to 40 mg p.o. daily.  She had mildly elevated troponins but they were flat suggestive of demand ischemia.  TEE did not show any wall motion abnormality.  She also had some anemia and her hemoglobin dropped to 7.8 for which she was transfused 1 unit of PRBC on 12/28/2020 along with 1 dose of IV for him.  Iron studies indicated iron deficiency anemia for which she is being prescribed oral iron at  discharge.  Her radiculopathic pain improved.  She was assessed by PT OT and they recommended home health with intermittent supervision and her daughter has committed to provide their supervision and she is willing to take patient home today.  She has been cleared by cardiology.  Rest of her medical issues remained stable.  She will follow with cardiology, PCP and pulmonology as outpatient.  She had received enough of IV antibiotics and steroids for her COPD.  Currently she has no wheezing and she is back to her baseline so no antibiotics or steroids are being prescribed.  Discharge Diagnoses:  Principal Problem:   Acute cardiogenic pulmonary edema (HCC) Active Problems:   Hypothyroidism   Essential hypertension   PULMONARY FIBROSIS ILD POST INFLAMMATORY CHRONIC   ACUTE ON CHRONIC DIASTOLIC HEART FAILURE   GERD (gastroesophageal reflux disease)   Morbid (severe) obesity due to excess calories (HCC)   S/P CABG x 3   Hyperlipidemia   COPD exacerbation (HCC)   Elevated troponin   Atrial flutter (HCC)   Acute on chronic respiratory failure with hypoxia and hypercapnia (HCC)   History of COVID-19   Sciatica   Diarrhea      01/01/2021  f/u ov/Jonathan Corpus re:  S/p covid/ rapid afib / off pred since ? And confused with instructions  Chief Complaint  Patient presents with  . Follow-up    Recent admission to hospital for COPD flare. Breathing is improving some, but not back to baseline. She has some wheezing and cough with clear sputum. She rarely uses her albuterol inhaler.    Dyspnea:   More limited by balance weakness  Cough: minimal Sleeping: electric bed x 30 degrees SABA use: rarely  02: 2lpm  at rest, sleep, 3 walking sats 98% Covid status:   2 vax  plus infected with likely omicron rec Sleep medicine evaluation next available  No change in medications  Plan A = Automatic =  sprivaa 2 puffs each am (work on practice inhalations Instead of symbicort =  Budesonide and formoterol first  thing in am and 12 hours later  in nebulizer Plan B = Backup Only use your albuterol(proventil) inhaler as a rescue medication Plan C = Crisis - only use your albuterol nebulizer if you first try Plan B and it fails to help > ok to use the nebulizer up to every 4 hours but if start needing it regularly call for immediate appointment Plan D = Deltasone  If plan C not working well, add Prednisone 10 mg take  4 each am x 2 days,   2 each am x 2 days,  1 each am x 2 days and stop  For nasty mucus >  zpak  Congested cough > mucinex dm  1200 mg every 12 hours and use the flutter valve  Make sure you check your oxygen saturation  at your highest level of activity     Virtual Visit via Telephone Note 03/01/2021   I connected with [name] on 03/01/21 at 8:15 AM by telephone and verified that I am speaking with the correct person using two identifiers. Pt is at home and this call made from my office with no other participants    I discussed the limitations, risks, security and privacy concerns of performing an evaluation and management service by telephone and the availability of in person appointments. I also discussed with the patient that there may be a patient responsible charge related to this service. The patient expressed understanding and agreed to proceed.   History of Present Illness: Dyspnea:  Across the room stopped by pain  Cough: none  Sleeping: electric bed at 30 degrees  SABA use: none while formoterol/budesonide and spiriva  02: 2lpm resting/ 3lpm with activity  Last prednisone 1st week of march 2022  Injection by Ramos helped back pain that radiated R  leg for day or two then it started down the L leg    No obvious day to day or daytime variability or assoc excess/ purulent sputum or mucus plugs or hemoptysis or cp or chest tightness, subjective wheeze or overt sinus or hb symptoms.    Also denies any obvious fluctuation of symptoms with weather or environmental  changes or other aggravating or alleviating factors except as outlined above.   Meds reviewed/ med reconciliation completed         Observations/Objective: Talking in full sentences/ good voice texture/ no rattling on voluntary cough     Assessment and Plan: See problem list for active a/p's   Follow Up Instructions: See avs for instructions unique to this ov which includes revised/ updated med list     I discussed the assessment and treatment plan with the patient. The patient was provided an opportunity to ask questions and all were answered. The patient agreed with the plan and demonstrated an understanding of the instructions.   The patient was advised to call back or seek an in-person evaluation if the symptoms worsen or if the condition fails to improve as anticipated.  I provided 25  minutes of non-face-to-face time during this encounter.   Sandrea Hughs, MD       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       - CT chest 07/03/10 Nonspecific PF mostly upper lobes       - CT chest 12/03/10 acute gg changes and effusions c/w chf        - PFT's  04/12/10 FEV1  1.21 (69%) ratio 77 and no change p B2,  DLC0 56% Asthmatic bronchitis     - HFA 50% p coaching July 18, 2010 >>  90%  02/25/2011  history of mild vitamin D deficiency

## 2021-03-01 NOTE — Patient Instructions (Signed)
Goal of palliative care is to control the pain or the sense of breathless without eliminating the brain  Let Dr Ethelene Hal know the R leg resolved but the same back pain came but radiates to L side.  No change in your medications  Would only consider as a last resort   Keep your original appointment to see me - call sooner if needed.

## 2021-03-01 NOTE — Assessment & Plan Note (Signed)
02 dependent  since 07/02/10 >>  83% RA December 05, 2010       - ONO RA 08/05/12  :  Positive sat < 89 x 2:70m> repeat on 2lpm rec 08/12/2012  - 06/17/2013 reported desat with activity p Knee surgery > rec restart 2lpm with activity  - 06/27/2013   Walked 2lpm  x one lap @ 185 stopped due to sat 88% not sob , desat to 82% on RA just at the  Start of the walk  - 04/10/14  Sats  81% RA and then  Walked 3lpm x  2 laps @ 185 ft each stopped due to end of study, no desat  - HCO3  11/09/17     = 31 - HCO3  08/27/18 = 37   - HCO3  03/11/2019 = 33   - HC03   02/05/21     = 35   Though somewhat paradoxic, when the lung fails to clear C02 properly and pC02 rises the lung then becomes a more efficient scavenger of C02 allowing lower work of breathing and  better C02 clearance albeit at a higher serum pC02 level - this is why pts can look a lot better than their ABG's would suggest and why it's so difficult to prognosticate endstage dz.  It's also why I strongly rec DNI status (ventilating pts down to a nl pC02 adversely affects this compensatory mechanism)   We discussed options > she is not willing to consider any surgery with risk of needing any type of ventilator post op so will try more conservative rx per Dr Ethelene Hal  Each maintenance medication was reviewed in detail including most importantly the difference between maintenance and as needed and under what circumstances the prns are to be used.  Please see AVS for specific  Instructions which are unique to this visit and I personally typed out  which were reviewed in detail over the phone with the patient and a copy provided via myChart

## 2021-03-05 DIAGNOSIS — N1831 Chronic kidney disease, stage 3a: Secondary | ICD-10-CM | POA: Diagnosis not present

## 2021-03-05 DIAGNOSIS — I501 Left ventricular failure: Secondary | ICD-10-CM | POA: Diagnosis not present

## 2021-03-05 DIAGNOSIS — I5033 Acute on chronic diastolic (congestive) heart failure: Secondary | ICD-10-CM | POA: Diagnosis not present

## 2021-03-05 DIAGNOSIS — J441 Chronic obstructive pulmonary disease with (acute) exacerbation: Secondary | ICD-10-CM | POA: Diagnosis not present

## 2021-03-05 DIAGNOSIS — I130 Hypertensive heart and chronic kidney disease with heart failure and stage 1 through stage 4 chronic kidney disease, or unspecified chronic kidney disease: Secondary | ICD-10-CM | POA: Diagnosis not present

## 2021-03-05 DIAGNOSIS — D631 Anemia in chronic kidney disease: Secondary | ICD-10-CM | POA: Diagnosis not present

## 2021-03-06 ENCOUNTER — Other Ambulatory Visit: Payer: Self-pay | Admitting: Internal Medicine

## 2021-03-06 ENCOUNTER — Telehealth: Payer: Self-pay | Admitting: Internal Medicine

## 2021-03-06 DIAGNOSIS — B372 Candidiasis of skin and nail: Secondary | ICD-10-CM

## 2021-03-06 MED ORDER — SPIRIVA RESPIMAT 2.5 MCG/ACT IN AERS: INHALATION_SPRAY | RESPIRATORY_TRACT | 5 refills | Status: DC

## 2021-03-06 NOTE — Telephone Encounter (Signed)
Called and spoke with Patient. Patient requested a refill of Spiriva to be sent to [facility]. Spiriva prescription sent to requested pharmacy. Nothing further at this time.

## 2021-03-06 NOTE — Telephone Encounter (Signed)
Ok to order as requested 

## 2021-03-06 NOTE — Telephone Encounter (Signed)
Pharmacy reached out and Incruse is not covered by insurance. Anything else like to try?  Dr. Sherene Sires. Please advise. Thanks!

## 2021-03-06 NOTE — Telephone Encounter (Signed)
Gracee from [facility]  Call and stated she want a order for pt physical Therapy 1 time a wk for 8 wks .Shelby Dubin would like a call back at [phone].

## 2021-03-07 ENCOUNTER — Ambulatory Visit: Payer: Medicare Other | Admitting: Cardiology

## 2021-03-07 DIAGNOSIS — M0589 Other rheumatoid arthritis with rheumatoid factor of multiple sites: Secondary | ICD-10-CM | POA: Diagnosis not present

## 2021-03-07 NOTE — Telephone Encounter (Signed)
Left message on machine for [name] to return our call.

## 2021-03-07 NOTE — Telephone Encounter (Signed)
Left message on machine for [name] to return our call. 

## 2021-03-08 ENCOUNTER — Telehealth: Payer: Self-pay | Admitting: Internal Medicine

## 2021-03-08 DIAGNOSIS — I4891 Unspecified atrial fibrillation: Secondary | ICD-10-CM | POA: Diagnosis not present

## 2021-03-08 DIAGNOSIS — I700 Atherosclerosis of aorta: Secondary | ICD-10-CM | POA: Diagnosis not present

## 2021-03-08 DIAGNOSIS — J849 Interstitial pulmonary disease, unspecified: Secondary | ICD-10-CM | POA: Diagnosis not present

## 2021-03-08 DIAGNOSIS — M4804 Spinal stenosis, thoracic region: Secondary | ICD-10-CM | POA: Diagnosis not present

## 2021-03-08 DIAGNOSIS — I501 Left ventricular failure: Secondary | ICD-10-CM | POA: Diagnosis not present

## 2021-03-08 DIAGNOSIS — J9622 Acute and chronic respiratory failure with hypercapnia: Secondary | ICD-10-CM | POA: Diagnosis not present

## 2021-03-08 DIAGNOSIS — I4892 Unspecified atrial flutter: Secondary | ICD-10-CM | POA: Diagnosis not present

## 2021-03-08 DIAGNOSIS — J8410 Pulmonary fibrosis, unspecified: Secondary | ICD-10-CM | POA: Diagnosis not present

## 2021-03-08 DIAGNOSIS — M5431 Sciatica, right side: Secondary | ICD-10-CM | POA: Diagnosis not present

## 2021-03-08 DIAGNOSIS — I130 Hypertensive heart and chronic kidney disease with heart failure and stage 1 through stage 4 chronic kidney disease, or unspecified chronic kidney disease: Secondary | ICD-10-CM | POA: Diagnosis not present

## 2021-03-08 DIAGNOSIS — D6869 Other thrombophilia: Secondary | ICD-10-CM | POA: Diagnosis not present

## 2021-03-08 DIAGNOSIS — M069 Rheumatoid arthritis, unspecified: Secondary | ICD-10-CM | POA: Diagnosis not present

## 2021-03-08 DIAGNOSIS — L4050 Arthropathic psoriasis, unspecified: Secondary | ICD-10-CM | POA: Diagnosis not present

## 2021-03-08 DIAGNOSIS — K219 Gastro-esophageal reflux disease without esophagitis: Secondary | ICD-10-CM | POA: Diagnosis not present

## 2021-03-08 DIAGNOSIS — J441 Chronic obstructive pulmonary disease with (acute) exacerbation: Secondary | ICD-10-CM | POA: Diagnosis not present

## 2021-03-08 DIAGNOSIS — J9621 Acute and chronic respiratory failure with hypoxia: Secondary | ICD-10-CM | POA: Diagnosis not present

## 2021-03-08 DIAGNOSIS — F32A Depression, unspecified: Secondary | ICD-10-CM | POA: Diagnosis not present

## 2021-03-08 DIAGNOSIS — I252 Old myocardial infarction: Secondary | ICD-10-CM | POA: Diagnosis not present

## 2021-03-08 DIAGNOSIS — D631 Anemia in chronic kidney disease: Secondary | ICD-10-CM | POA: Diagnosis not present

## 2021-03-08 DIAGNOSIS — M4854XD Collapsed vertebra, not elsewhere classified, thoracic region, subsequent encounter for fracture with routine healing: Secondary | ICD-10-CM | POA: Diagnosis not present

## 2021-03-08 DIAGNOSIS — I2510 Atherosclerotic heart disease of native coronary artery without angina pectoris: Secondary | ICD-10-CM | POA: Diagnosis not present

## 2021-03-08 DIAGNOSIS — J80 Acute respiratory distress syndrome: Secondary | ICD-10-CM | POA: Diagnosis not present

## 2021-03-08 DIAGNOSIS — I5033 Acute on chronic diastolic (congestive) heart failure: Secondary | ICD-10-CM | POA: Diagnosis not present

## 2021-03-08 DIAGNOSIS — E039 Hypothyroidism, unspecified: Secondary | ICD-10-CM | POA: Diagnosis not present

## 2021-03-08 DIAGNOSIS — N1831 Chronic kidney disease, stage 3a: Secondary | ICD-10-CM | POA: Diagnosis not present

## 2021-03-08 MED ORDER — SPIRIVA RESPIMAT 2.5 MCG/ACT IN AERS: 2.0000 | INHALATION_SPRAY | Freq: Every day | RESPIRATORY_TRACT | 3 refills | Status: DC

## 2021-03-08 NOTE — Telephone Encounter (Signed)
Left message on machine for Grace to return our call. 

## 2021-03-08 NOTE — Telephone Encounter (Signed)
Verbal orders given to [place].

## 2021-03-08 NOTE — Telephone Encounter (Signed)
[name] returned the call to the office and would like to see if she can get a call back.

## 2021-03-08 NOTE — Telephone Encounter (Signed)
I called and spoke with pharmacy and they stated that the spiriva was not covered by her insurance anymore.  This is why the incruse was sent to the pharmacy.    Pt wanted to see if MW would be able to send in to the pharmacy for a 1 month supply for the spiriva 2.5.  Pt stated that she borrowed an inhaler from her neighbor and now has to pay this back and since her insurance will not cover this she will have to pay for this out of pocket.  MW please advise. Thanks

## 2021-03-08 NOTE — Telephone Encounter (Signed)
I called the pt back about the samples.    She stated that she found a pharmacy in [place], [place] that she can get a 90 day supply of the Spiriva 2.5 for $250.  She stated that she would really like to stay on the spiriva since she has seen such a good improvement in her breathing with it.  If MW is ok with her staying on this and sending in the rx, it will need to be faxed to    [facility] [phone].    MW please advise. Thanks

## 2021-03-08 NOTE — Telephone Encounter (Signed)
Fine with me

## 2021-03-08 NOTE — Telephone Encounter (Signed)
Called and spoke with pt letting her know the info stated by MW and she verbalized understanding. Rx has been printed and this has been sent to preferred pharmacy via fax stated by pt. Nothing further needed.

## 2021-03-08 NOTE — Telephone Encounter (Signed)
That's fine or can give her two samples as that's a month supply

## 2021-03-11 DIAGNOSIS — M5459 Other low back pain: Secondary | ICD-10-CM | POA: Diagnosis not present

## 2021-03-12 DIAGNOSIS — I5033 Acute on chronic diastolic (congestive) heart failure: Secondary | ICD-10-CM | POA: Diagnosis not present

## 2021-03-12 DIAGNOSIS — M5431 Sciatica, right side: Secondary | ICD-10-CM | POA: Diagnosis not present

## 2021-03-12 DIAGNOSIS — I130 Hypertensive heart and chronic kidney disease with heart failure and stage 1 through stage 4 chronic kidney disease, or unspecified chronic kidney disease: Secondary | ICD-10-CM | POA: Diagnosis not present

## 2021-03-12 DIAGNOSIS — M4804 Spinal stenosis, thoracic region: Secondary | ICD-10-CM | POA: Diagnosis not present

## 2021-03-12 DIAGNOSIS — J441 Chronic obstructive pulmonary disease with (acute) exacerbation: Secondary | ICD-10-CM | POA: Diagnosis not present

## 2021-03-12 DIAGNOSIS — I4891 Unspecified atrial fibrillation: Secondary | ICD-10-CM | POA: Diagnosis not present

## 2021-03-13 ENCOUNTER — Telehealth: Payer: Self-pay | Admitting: Internal Medicine

## 2021-03-13 NOTE — Telephone Encounter (Signed)
Pts daughter is calling in to see if the pts Rx duloxetine (CYMBALTA) 20 MG can be increased due to the pt being very emotional (crying at the drop of a hat-last for a few minutes) and get upset when she can not get things to go right.  Daughter would like to have a call back.

## 2021-03-14 ENCOUNTER — Telehealth: Payer: Self-pay | Admitting: Internal Medicine

## 2021-03-14 ENCOUNTER — Other Ambulatory Visit: Payer: Self-pay | Admitting: Internal Medicine

## 2021-03-14 DIAGNOSIS — R7989 Other specified abnormal findings of blood chemistry: Secondary | ICD-10-CM

## 2021-03-14 DIAGNOSIS — E032 Hypothyroidism due to medicaments and other exogenous substances: Secondary | ICD-10-CM

## 2021-03-14 MED ORDER — INCRUSE ELLIPTA 62.5 MCG/INH IN AEPB: 1.0000 | INHALATION_SPRAY | Freq: Every day | RESPIRATORY_TRACT | 2 refills | Status: DC

## 2021-03-14 MED ORDER — SPIRIVA RESPIMAT 2.5 MCG/ACT IN AERS: 2.0000 | INHALATION_SPRAY | Freq: Every day | RESPIRATORY_TRACT | 0 refills | Status: DC

## 2021-03-14 MED ORDER — DULOXETINE HCL 30 MG PO CPEP: 30.0000 mg | ORAL_CAPSULE | Freq: Every day | ORAL | 1 refills | Status: DC

## 2021-03-14 MED ORDER — ATORVASTATIN CALCIUM 40 MG PO TABS: ORAL_TABLET | ORAL | 3 refills | Status: DC

## 2021-03-14 NOTE — Telephone Encounter (Signed)
I called and spoke with patient regarding Incruse. Patient is wanting a 90-day script sent to [facility]. Sent in script, patient verbalized understanding, nothing further needed.

## 2021-03-14 NOTE — Addendum Note (Signed)
Addended by: Kern Reap B on: 03/14/2021 09:40 AM   Modules accepted: Orders

## 2021-03-14 NOTE — Telephone Encounter (Signed)
Pt's medication was sent to pt's pharmacy as requested. Confirmation received.  °

## 2021-03-14 NOTE — Telephone Encounter (Signed)
*  STAT* If patient is at the pharmacy, call can be transferred to refill team.   1. Which medications need to be refilled? (please list name of each medication and dose if known)  atorvastatin (LIPITOR) 40 MG tablet  2. Which pharmacy/location (including street and city if local pharmacy) is medication to be sent to? [id number] [place] - [address]  3. Do they need a 30 day or 90 day supply? 30 day supply

## 2021-03-14 NOTE — Telephone Encounter (Signed)
Called and spoke with Patient.  Patient was calling to check on spiriva samples.  Patient stated she is waiting for her Spiriva from mail order, but was told at that time, no Spiriva samples were available in office. Spiriva samples are in office today and I placed 2 samples at front desk.  Patient stated her daughter was coming to pick them up later today for her. Nothing further at this time.

## 2021-03-14 NOTE — Telephone Encounter (Signed)
Spoke with daughter and Rx sent.

## 2021-03-15 ENCOUNTER — Other Ambulatory Visit (HOSPITAL_COMMUNITY)
Admission: RE | Admit: 2021-03-15 | Discharge: 2021-03-15 | Disposition: A | Discharge status: Home / Self Care | Payer: Medicare Other | Source: Ambulatory Visit | Attending: Physician Assistant | Admitting: Physician Assistant

## 2021-03-15 DIAGNOSIS — Z01812 Encounter for preprocedural laboratory examination: Secondary | ICD-10-CM | POA: Insufficient documentation

## 2021-03-15 DIAGNOSIS — Z20822 Contact with and (suspected) exposure to covid-19: Secondary | ICD-10-CM | POA: Insufficient documentation

## 2021-03-16 LAB — SARS CORONAVIRUS 2 (TAT 6-24 HRS): SARS Coronavirus 2: NEGATIVE

## 2021-03-18 ENCOUNTER — Inpatient Hospital Stay (HOSPITAL_COMMUNITY)
Admission: RE | Admit: 2021-03-18 | Discharge: 2021-03-21 | DRG: 309 | Disposition: A | Discharge status: Home / Self Care | Payer: Medicare Other | Source: Ambulatory Visit | Attending: Cardiology | Admitting: Cardiology

## 2021-03-18 ENCOUNTER — Other Ambulatory Visit: Payer: Self-pay

## 2021-03-18 ENCOUNTER — Encounter (HOSPITAL_COMMUNITY): Payer: Self-pay | Admitting: Cardiology

## 2021-03-18 ENCOUNTER — Ambulatory Visit (HOSPITAL_COMMUNITY)
Admission: RE | Admit: 2021-03-18 | Discharge: 2021-03-18 | Disposition: A | Discharge status: Home / Self Care | Payer: Medicare Other | Source: Ambulatory Visit | Attending: Physician Assistant | Admitting: Physician Assistant

## 2021-03-18 VITALS — BP 160/76 | HR 87 | Ht 59.0 in | Wt 200.0 lb

## 2021-03-18 DIAGNOSIS — Z951 Presence of aortocoronary bypass graft: Secondary | ICD-10-CM

## 2021-03-18 DIAGNOSIS — Z8616 Personal history of COVID-19: Secondary | ICD-10-CM

## 2021-03-18 DIAGNOSIS — Z8249 Family history of ischemic heart disease and other diseases of the circulatory system: Secondary | ICD-10-CM

## 2021-03-18 DIAGNOSIS — E785 Hyperlipidemia, unspecified: Secondary | ICD-10-CM | POA: Diagnosis present

## 2021-03-18 DIAGNOSIS — I4819 Other persistent atrial fibrillation: Principal | ICD-10-CM | POA: Diagnosis present

## 2021-03-18 DIAGNOSIS — I252 Old myocardial infarction: Secondary | ICD-10-CM

## 2021-03-18 DIAGNOSIS — E669 Obesity, unspecified: Secondary | ICD-10-CM | POA: Diagnosis present

## 2021-03-18 DIAGNOSIS — Z6841 Body Mass Index (BMI) 40.0 and over, adult: Secondary | ICD-10-CM | POA: Diagnosis not present

## 2021-03-18 DIAGNOSIS — D6869 Other thrombophilia: Secondary | ICD-10-CM | POA: Diagnosis present

## 2021-03-18 DIAGNOSIS — N179 Acute kidney failure, unspecified: Secondary | ICD-10-CM | POA: Diagnosis present

## 2021-03-18 DIAGNOSIS — I110 Hypertensive heart disease with heart failure: Secondary | ICD-10-CM | POA: Diagnosis present

## 2021-03-18 DIAGNOSIS — Z9981 Dependence on supplemental oxygen: Secondary | ICD-10-CM

## 2021-03-18 DIAGNOSIS — I5032 Chronic diastolic (congestive) heart failure: Secondary | ICD-10-CM | POA: Diagnosis present

## 2021-03-18 DIAGNOSIS — I2510 Atherosclerotic heart disease of native coronary artery without angina pectoris: Secondary | ICD-10-CM | POA: Diagnosis present

## 2021-03-18 DIAGNOSIS — I484 Atypical atrial flutter: Secondary | ICD-10-CM | POA: Diagnosis present

## 2021-03-18 DIAGNOSIS — J8410 Pulmonary fibrosis, unspecified: Secondary | ICD-10-CM | POA: Diagnosis present

## 2021-03-18 LAB — BASIC METABOLIC PANEL
Anion gap: 5 (ref 5–15)
BUN: 37 mg/dL — ABNORMAL HIGH (ref 8–23)
CO2: 37 mmol/L — ABNORMAL HIGH (ref 22–32)
Calcium: 9.6 mg/dL (ref 8.9–10.3)
Chloride: 98 mmol/L (ref 98–111)
Creatinine, Ser: 1.27 mg/dL — ABNORMAL HIGH (ref 0.44–1.00)
GFR, Estimated: 44 mL/min — ABNORMAL LOW (ref >60)
Glucose, Bld: 110 mg/dL — ABNORMAL HIGH (ref 70–99)
Potassium: 4.4 mmol/L (ref 3.5–5.1)
Sodium: 140 mmol/L (ref 135–145)

## 2021-03-18 LAB — MAGNESIUM: Magnesium: 2 mg/dL (ref 1.7–2.4)

## 2021-03-18 MED ORDER — ATORVASTATIN CALCIUM 40 MG PO TABS
40.0000 mg | ORAL_TABLET | Freq: Every day | ORAL | Status: DC
  Administered 2021-03-18 – 2021-03-20 (×3): 40 mg via ORAL
  Filled 2021-03-18 (×3): qty 1

## 2021-03-18 MED ORDER — SODIUM CHLORIDE 0.9% FLUSH
3.0000 mL | Freq: Two times a day (BID) | INTRAVENOUS | Status: DC
  Administered 2021-03-18 – 2021-03-20 (×6): 3 mL via INTRAVENOUS

## 2021-03-18 MED ORDER — TORSEMIDE 20 MG PO TABS
20.0000 mg | ORAL_TABLET | Freq: Every day | ORAL | Status: DC
  Administered 2021-03-19 – 2021-03-21 (×3): 20 mg via ORAL
  Filled 2021-03-18 (×3): qty 1

## 2021-03-18 MED ORDER — DOFETILIDE 250 MCG PO CAPS
250.0000 ug | ORAL_CAPSULE | Freq: Two times a day (BID) | ORAL | Status: DC
  Administered 2021-03-18 – 2021-03-21 (×6): 250 ug via ORAL
  Filled 2021-03-18 (×6): qty 1

## 2021-03-18 MED ORDER — CYCLOBENZAPRINE HCL 10 MG PO TABS
5.0000 mg | ORAL_TABLET | Freq: Every day | ORAL | Status: DC
  Administered 2021-03-18 – 2021-03-20 (×3): 5 mg via ORAL
  Filled 2021-03-18 (×3): qty 1

## 2021-03-18 MED ORDER — LEFLUNOMIDE 20 MG PO TABS
20.0000 mg | ORAL_TABLET | Freq: Every day | ORAL | Status: DC
  Administered 2021-03-19 – 2021-03-21 (×3): 20 mg via ORAL
  Filled 2021-03-18 (×3): qty 1

## 2021-03-18 MED ORDER — VITAMIN B-12 1000 MCG PO TABS
1000.0000 ug | ORAL_TABLET | Freq: Every day | ORAL | Status: DC
  Administered 2021-03-19 – 2021-03-21 (×3): 1000 ug via ORAL
  Filled 2021-03-18 (×3): qty 1

## 2021-03-18 MED ORDER — BUDESONIDE 0.25 MG/2ML IN SUSP
0.2500 mg | Freq: Two times a day (BID) | RESPIRATORY_TRACT | Status: DC
  Administered 2021-03-18 – 2021-03-21 (×5): 0.25 mg via RESPIRATORY_TRACT
  Filled 2021-03-18 (×6): qty 2

## 2021-03-18 MED ORDER — FERROUS SULFATE 325 (65 FE) MG PO TABS
325.0000 mg | ORAL_TABLET | Freq: Every day | ORAL | Status: DC
  Administered 2021-03-19 – 2021-03-21 (×3): 325 mg via ORAL
  Filled 2021-03-18 (×3): qty 1

## 2021-03-18 MED ORDER — SODIUM CHLORIDE 0.9 % IV SOLN
250.0000 mL | INTRAVENOUS | Status: DC | PRN
  Administered 2021-03-19: 250 mL via INTRAVENOUS

## 2021-03-18 MED ORDER — UMECLIDINIUM BROMIDE 62.5 MCG/INH IN AEPB
1.0000 | INHALATION_SPRAY | Freq: Every day | RESPIRATORY_TRACT | Status: DC
  Administered 2021-03-19 – 2021-03-21 (×3): 1 via RESPIRATORY_TRACT
  Filled 2021-03-18: qty 7

## 2021-03-18 MED ORDER — DOFETILIDE 500 MCG PO CAPS: 500.0000 ug | ORAL_CAPSULE | Freq: Two times a day (BID) | ORAL | Status: DC

## 2021-03-18 MED ORDER — VITAMIN D 25 MCG (1000 UNIT) PO TABS
2000.0000 [IU] | ORAL_TABLET | Freq: Every day | ORAL | Status: DC
  Administered 2021-03-19 – 2021-03-21 (×3): 2000 [IU] via ORAL
  Filled 2021-03-18 (×3): qty 2

## 2021-03-18 MED ORDER — LOSARTAN POTASSIUM 50 MG PO TABS
50.0000 mg | ORAL_TABLET | Freq: Every day | ORAL | Status: DC
  Administered 2021-03-19 – 2021-03-21 (×3): 50 mg via ORAL
  Filled 2021-03-18 (×3): qty 1

## 2021-03-18 MED ORDER — DULOXETINE HCL 30 MG PO CPEP
30.0000 mg | ORAL_CAPSULE | Freq: Every day | ORAL | Status: DC
  Administered 2021-03-19 – 2021-03-21 (×3): 30 mg via ORAL
  Filled 2021-03-18 (×3): qty 1

## 2021-03-18 MED ORDER — PREGABALIN 25 MG PO CAPS
50.0000 mg | ORAL_CAPSULE | Freq: Every day | ORAL | Status: DC
  Administered 2021-03-18 – 2021-03-20 (×3): 50 mg via ORAL
  Filled 2021-03-18 (×3): qty 2

## 2021-03-18 MED ORDER — MONTELUKAST SODIUM 10 MG PO TABS
10.0000 mg | ORAL_TABLET | Freq: Every day | ORAL | Status: DC
  Administered 2021-03-18 – 2021-03-20 (×3): 10 mg via ORAL
  Filled 2021-03-18 (×3): qty 1

## 2021-03-18 MED ORDER — ARFORMOTEROL TARTRATE 15 MCG/2ML IN NEBU
15.0000 ug | INHALATION_SOLUTION | Freq: Two times a day (BID) | RESPIRATORY_TRACT | Status: DC
  Administered 2021-03-18 – 2021-03-21 (×6): 15 ug via RESPIRATORY_TRACT
  Filled 2021-03-18 (×6): qty 2

## 2021-03-18 MED ORDER — PANTOPRAZOLE SODIUM 40 MG PO TBEC
40.0000 mg | DELAYED_RELEASE_TABLET | Freq: Every day | ORAL | Status: DC
  Administered 2021-03-19 – 2021-03-21 (×3): 40 mg via ORAL
  Filled 2021-03-18 (×4): qty 1

## 2021-03-18 MED ORDER — DILTIAZEM HCL ER COATED BEADS 240 MG PO CP24
240.0000 mg | ORAL_CAPSULE | Freq: Every day | ORAL | Status: DC
  Administered 2021-03-19 – 2021-03-21 (×3): 240 mg via ORAL
  Filled 2021-03-18 (×3): qty 1

## 2021-03-18 MED ORDER — SODIUM CHLORIDE 0.9% FLUSH: 3.0000 mL | INTRAVENOUS | Status: DC | PRN

## 2021-03-18 MED ORDER — ALBUTEROL SULFATE HFA 108 (90 BASE) MCG/ACT IN AERS
2.0000 | INHALATION_SPRAY | RESPIRATORY_TRACT | Status: DC | PRN
  Filled 2021-03-18: qty 6.7

## 2021-03-18 MED ORDER — TRAMADOL HCL 50 MG PO TABS
50.0000 mg | ORAL_TABLET | Freq: Four times a day (QID) | ORAL | Status: DC
  Administered 2021-03-18 – 2021-03-21 (×10): 50 mg via ORAL
  Filled 2021-03-18 (×10): qty 1

## 2021-03-18 MED ORDER — LEVOTHYROXINE SODIUM 25 MCG PO TABS
125.0000 ug | ORAL_TABLET | Freq: Every day | ORAL | Status: DC
  Administered 2021-03-19 – 2021-03-21 (×3): 125 ug via ORAL
  Filled 2021-03-18 (×4): qty 1

## 2021-03-18 MED ORDER — TIOTROPIUM BROMIDE MONOHYDRATE 2.5 MCG/ACT IN AERS: 2.0000 | INHALATION_SPRAY | Freq: Every day | RESPIRATORY_TRACT | Status: DC

## 2021-03-18 MED ORDER — APIXABAN 5 MG PO TABS
5.0000 mg | ORAL_TABLET | Freq: Two times a day (BID) | ORAL | Status: DC
  Administered 2021-03-18 – 2021-03-21 (×6): 5 mg via ORAL
  Filled 2021-03-18 (×6): qty 1

## 2021-03-18 NOTE — Care Management (Signed)
0272 03-18-21 Benefits check submitted for Tikosyn. Case Manager will follow for cost. Case Manager spoke with daughter and she wants the initial RN for [facility] [address]. Daughter wants the Rx for 90 day supply sent via mail to [facility]. No further needs at this time. Gala Lewandowsky , RN,BSN Case Manager

## 2021-03-18 NOTE — H&P (Addendum)
.    Primary Care Physician: [name], [name], MD Primary Cardiologist: Dr Izora Ribas Primary Electrophysiologist: Dr Ladona Ridgel (new) Referring Physician: Dr Herminio Commons [name] is a 75 y.o. 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  The patient was initially diagnosed with atrial flutter on a cardiac monitor 12/24/20 with Zio patch showing both atrial flutter and atrial tachycardia 37% burden with rapid rates. The monitor was placed for symptoms of palpitations. Of note, she was diagnosed with COVID 11/22/20.  She has a CHADS2VASC score of 5. Her diltiazem was increased to 240 mg daily on 2/3 and she reports her palpitations have improved although still present. Patient did have some increased lower extremity edema and SOB. She admits she skipped several doses of her Lasix. She resumed the medication and her symptoms resolved.   Patient was admitted 2/24-2/28/22 with acute respiratory failure secondary to acute on chronic diastolic CHF, rapid atrial fibrillation, and COPD exacerbation. She was transitioned to torsemide. She spontaneously converted to SR with treatment of COPD and diureses. She was seen on follow up 02/05/21 and was back in symptomatic afib. Patient is s/p TEE guided DCCV on 02/12/21.   On follow up today, patient presents for dofetilide admission. She remains in SR. She denies any missed doses of anticoagulation in the last 3 weeks.   Today, she denied symptoms of palpitations, chest pain, orthopnea, PND, dizziness, presyncope, syncope, snoring, daytime somnolence, bleeding, or neurologic sequela. The patient is tolerating medications without difficulties and is otherwise without complaint today.   Pt presents for admission for Tikosyn load without change to history above.  Atrial Fibrillation Risk  Factors:  she does not have symptoms or diagnosis of sleep apnea. she does not have a history of rheumatic fever.   she has a BMI of There is no height or weight on file to calculate BMI.. There were no vitals filed for this visit.  Family History  Problem Relation Age of Onset   Breast cancer Mother    Coronary artery disease Father    Rheum arthritis Father      Atrial Fibrillation Management history:  Previous antiarrhythmic drugs: none Previous cardioversions: 02/12/21 Previous ablations: none CHADS2VASC score: 5 Anticoagulation history: Eliquis   Past Medical History:  Diagnosis Date   Arthritis    OA RIGHT KNEE WITH PAIN   Barrett esophagus    Bradycardia 06/01/2015   CAD in native artery    a. NSTEMI 05/2015 s/p emergent CABG.   Chronic diastolic (congestive) heart failure (HCC)    Chronic kidney disease, stage 3a (HCC)    Chronic respiratory failure (HCC)    Chronic respiratory failure with hypoxia and hypercapnia (HCC) 02/04/2010   Followed in Pulmonary clinic/ [facility]       - 02 dependent  since 07/02/10 >>  83% RA December 05, 2010       - ONO RA 08/05/12  :  Positive sat < 89 x 2:30m> repeat on 2lpm rec 08/12/2012  - 06/17/2013 reported desat with activity p Knee surgery > rec restart 2lpm with activity  - 06/27/2013   Walked 2lpm  x one lap @ 185 stopped due to sat 88% not sob , desat to 82% on RA just at th   COPD III spirometry if use FEV1/VC p saba  07/18/2010  Quit smoking May 2006       - PFT's  04/12/10 FEV1  1.21 (69%) ratio 77 and no change p B2,  DLC0 56%   VC 70%         - PFTs  08/08/2013 FEV1 1.21 (60%) ratio 86 and no change p B2 DLCO 79%  VC 72%  On symbicort 160 2bid  - PFT's  02/08/2018  FEV1 0.70 (40 % ) ratio 56 if use FEV1/VC  p 38 % improvement from saba p symb 160 prior to study with DLCO  78 % corrects to 147  % for alv volume   - 02/08/2018   Cough variant asthma 02/26/2011   Followed in Pulmonary clinic/ [facility]  - PFT's   06/04/15  FEV1 1.20 (67 % ) ratio 83  p 6 % improvement from saba with DLCO  80 % corrects to 132 % for alv volume      - Clinical dx based on response to symbicort       FENO 09/16/2016  =   96 on symbicort 160 2bid > added singulair  Allergy profile 09/16/2016 >  Eos 0.5 /  IgE  78 neg RAST  -  Referred to rehab 04/29/2017 > completed   Essential hypertension 04/20/2007   Qualifier: Diagnosis of  By: Marcelyn Ditty, RN, Katy Fitch    GERD (gastroesophageal reflux disease)    History of ARDS 2006   History of home oxygen therapy    AT NIGHT WHEN SLEEPING 2 L / MIN NASAL CANNULA   Hyperlipidemia 07/12/2015   Hypothyroidism    Intracranial hemorrhage (HCC) 2019   a. small intracranial hemorrhage in setting of HTN.   Mild carotid artery disease (HCC)    a. Duplex 1-39% bilaterally in 2016.   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   Followed as Primary Care Patient/ [facility]  -s/p ARDS 2006 with bacteremic S  Pna       - CT chest 07/03/10 Nonspecific PF mostly upper lobes       - CT chest 12/03/10 acute gg changes and effusions c/w chf - PFT's  02/08/2018  FVC 0.64 (28 %)   with DLCO  78 % corrects to 147 % for alv volume      Rheumatic disease    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   SOB (shortness of breath) on exertion    Past Surgical History:  Procedure Laterality Date   ABDOMINOPLASTY     CARDIAC CATHETERIZATION N/A 06/01/2015   Procedure: Left Heart Cath and Coronary Angiography;  Surgeon: Runell Gess, MD;  Location: [facility] INVASIVE CV LAB;  Service: Cardiovascular;  Laterality: N/A;   CARDIOVERSION N/A 02/12/2021   Procedure: CARDIOVERSION;  Surgeon: Meriam Sprague, MD;  Location: [facility] ENDOSCOPY;  Service:  Cardiovascular;  Laterality: N/A;   CARPAL TUNNEL RELEASE     CHOLECYSTECTOMY     CORONARY ARTERY BYPASS GRAFT N/A 06/04/2015   Procedure: CORONARY ARTERY BYPASS GRAFT times three            with left internal mammary artery and right leg saphenous vein;  Surgeon: Alleen Borne, MD;  Location: [facility] OR;  Service: Open Heart Surgery;  Laterality: N/A;   cosmetic breast surgery     JOINT REPLACEMENT     KNEE ARTHROSCOPY Left    TEE WITHOUT CARDIOVERSION  06/04/2015   Procedure: TRANSESOPHAGEAL ECHOCARDIOGRAM (TEE);  Surgeon: Alleen Borne, MD;  Location[facility] OR;  Service: Open Heart Surgery;;   TEE WITHOUT CARDIOVERSION N/A 02/12/2021   Procedure: TRANSESOPHAGEAL ECHOCARDIOGRAM (TEE);  Surgeon: Meriam Sprague, MD;  Location: [facility] ENDOSCOPY;  Service: Cardiovascular;  Laterality: N/A;   TOTAL KNEE ARTHROPLASTY Left    TOTAL KNEE ARTHROPLASTY Right 06/06/2013   Procedure: RIGHT TOTAL KNEE ARTHROPLASTY;  Surgeon: Loanne Drilling, MD;  Location: [facility] ORS;  Service: Orthopedics;  Laterality: Right;    No current facility-administered medications for this encounter.    Allergies  Allergen Reactions   Gabapentin Other (See Comments)    Dizziness, lighthead    Social History   Socioeconomic History   Marital status: Widowed    Spouse name: Not on file   Number of children: Not on file   Years of education: Not on file   Highest education level: Not on file  Occupational History   Occupation: retired    Associate Professor: [employer]  Tobacco Use   Smoking status: Former Smoker    Packs/day: 1.50    Years: 58.50    Pack years: 87.75    Types: Cigarettes    Quit date: 03/03/2005    Years since quitting: 16.0   Smokeless tobacco: Never Used  Vaping Use   Vaping Use: Never used  Substance and Sexual Activity   Alcohol use: No   Drug use: No   Sexual activity: Not on file  Other Topics Concern   Not on file  Social History Narrative   Not on file   Social Determinants of Health    Financial Resource Strain: Low Risk    Difficulty of Paying Living Expenses: Not hard at all  Food Insecurity: Not on file  Transportation Needs: No Transportation Needs   Lack of Transportation (Medical): No   Lack of Transportation (Non-Medical): No  Physical Activity: Not on file  Stress: Not on file  Social Connections: Not on file  Intimate Partner Violence: Not on file     ROS- All systems are reviewed and negative except as per the HPI above.  Physical Exam: There were no vitals filed for this visit.  GEN- The patient is a well appearing obese elderly female, alert and oriented x 3 today.   HEENT-head normocephalic, atraumatic, sclera clear, conjunctiva pink, hearing intact, trachea midline. Lungs- Clear to ausculation bilaterally, normal work of breathing Heart- Regular rate and rhythm, no murmurs, rubs or gallops  GI- soft, NT, ND, + BS Extremities- no clubbing, cyanosis, or edema MS- no significant deformity or atrophy Skin- no rash or lesion Psych- euthymic mood, full affect Neuro- strength and sensation are intact   Wt Readings from Last 3 Encounters:  03/18/21 90.7 kg  02/14/21 90.6 kg  02/12/21 89.4 kg    EKG today demonstrates  SR, inc RBBB Vent. rate 87 BPM PR interval 190 ms QRS duration 92 ms QT/QTcB 360/433 ms  Echo 12/27/20 demonstrated  1. Left ventricular ejection fraction, by estimation, is 60 to 65%. The  left ventricle has normal function. The left ventricle has no regional  wall motion abnormalities. There is severe left ventricular hypertrophy.  Left ventricular diastolic parameters   are indeterminate.   2. Right ventricular systolic function is normal. The right ventricular  size is normal.   3.  Left atrial size was moderately dilated.   4. The mitral valve is normal in structure. No evidence of mitral valve  regurgitation. No evidence of mitral stenosis. Moderate mitral annular  calcification.   5. The aortic valve is normal in  structure. Aortic valve regurgitation is  not visualized. No aortic stenosis is present.   6. The inferior vena cava is dilated in size with <50% respiratory  variability, suggesting right atrial pressure of 15 mmHg.  Epic records are reviewed at length today  CHA2DS2-VASc Score = 7  The patient's score is based upon: CHF History: Yes HTN History: Yes Diabetes History: No Stroke History: Yes Vascular Disease History: Yes Age Score: 1 Gender Score: 1      ASSESSMENT AND PLAN: 1. Persistent atrial fibrillation/Atrial flutter The patient's CHA2DS2-VASc score is 7, indicating a 11.2% annual risk of stroke.   Patient presents for dofetilide admission.  Patient aware of price of dofetilide. Continue Eliquis 5 mg BID, states no missed doses in the last 3 weeks. No recent benadryl use PharmD has screened medications. Celexa has been transitioned to Cymbalta.  QTc in SR 433 ms Labs today show creatinine at 1.27, K+ 4.4 and mag 2.0, CrCl calculated at 56 mL/min Continue diltiazem 240 mg daily  2. Secondary Hypercoagulable State (ICD10:  D68.69) The patient is at significant risk for stroke/thromboembolism based upon her CHA2DS2-VASc Score of 7.  Continue Apixaban (Eliquis).   3. Obesity There is no height or weight on file to calculate BMI. Lifestyle modification was discussed and encouraged including regular physical activity and weight reduction.  4. CAD S/p CABG No anginal symptoms.  5. HTN Stable, no changes today.  6. Chronic diastolic CHF Weight stable. No signs or symptoms of fluid overload today.   Pt presents today for tikosyn admission. Dose adjusted to 250 mcg BID due to CrCl of 55 mL/min.  Casimiro Needle [address] [place], PA-C  03/18/2021 2:16 PM   EP Attending  Patient seen and examined. Agree with the findings as noted above. The patient presents for initiation of dofetilide. She is currently NSR. Her QT is acceptable. She will need to lose weight. Her dose is  adjusted to account for her renal function.   Sharlot Gowda Leonardo Plaia,MD

## 2021-03-18 NOTE — Progress Notes (Signed)
Pharmacy: Dofetilide (Tikosyn) - Initial Consult Assessment and Electrolyte Replacement  Pharmacy consulted to assist in monitoring and replacing electrolytes in this 75 y.o. female admitted on 03/18/2021 undergoing dofetilide initiation. First dofetilide dose: 250 mcg BID to start 5/16 at 2000 pm  Assessment:  Patient Exclusion Criteria: If any screening criteria checked as "Yes", then  patient  should NOT receive dofetilide until criteria item is corrected.  If "Yes" please indicate correction plan.  YES  NO Patient  Exclusion Criteria Correction Plan   []   [x]   Baseline QTc interval is greater than or equal to 440 msec. IF above YES box checked dofetilide contraindicated unless patient has ICD; then may proceed if QTc 500-550 msec or with known ventricular conduction abnormalities may proceed with QTc 550-600 msec. QTc =  433    []   [x]   Patient is known or suspected to have a digoxin level greater than 2 ng/ml: No results found for: DIGOXIN     []   [x]   Creatinine clearance less than 20 ml/min (calculated using Cockcroft-Gault, actual body weight and serum creatinine): Estimated Creatinine Clearance: 38.2 mL/min (A) (by C-G formula based on SCr of 1.27 mg/dL (H)).     []   [x]  Patient has received drugs known to prolong the QT intervals within the last 48 hours (phenothiazines, tricyclics or tetracyclic antidepressants, erythromycin, H-1 antihistamines, cisapride, fluoroquinolones, azithromycin). Drugs not listed above may have an, as yet, undetected potential to prolong the QT interval, updated information on QT prolonging agents is available at this website:QT prolonging agents or www.crediblemeds.org    []   [x]   Patient received a dose of hydrochlorothiazide (Oretic) alone or in any combination including triamterene (Dyazide, Maxzide) in the last 48 hours.    []   [x]  Patient received a medication known to increase dofetilide plasma concentrations prior to initial dofetilide  dose:  . Trimethoprim (Primsol, Proloprim) in the last 36 hours . Verapamil (Calan, Verelan) in the last 36 hours or a sustained release dose in the last 72 hours . Megestrol (Megace) in the last 5 days  . Cimetidine (Tagamet) in the last 6 hours . Ketoconazole (Nizoral) in the last 24 hours . Itraconazole (Sporanox) in the last 48 hours  . Prochlorperazine (Compazine) in the last 36 hours     []   [x]   Patient is known to have a history of torsades de pointes; congenital or acquired long QT syndromes.    []   [x]   Patient has received a Class 1 antiarrhythmic with less than 2 half-lives since last dose. (Disopyramide, Quinidine, Procainamide, Lidocaine, Mexiletine, Flecainide, Propafenone)    []   [x]   Patient has received amiodarone therapy in the past 3 months or amiodarone level is greater than 0.3 ng/ml.    Patient has been appropriately anticoagulated with Eliquis.  Labs:    Component Value Date/Time   K 4.4 03/18/2021 1108   MG 2.0 03/18/2021 1108     Plan: Potassium: K >/= 4: Appropriate to initiate Tikosyn, no replacement needed    Magnesium: Mg >2: Appropriate to initiate Tikosyn, no replacement needed     Thank you for allowing pharmacy to participate in this patient's care   , , [facility] Clinical Pharmacist  03/18/2021 2:35 PM   [facility] pharmacy phone numbers are listed on [email or web address]

## 2021-03-18 NOTE — Progress Notes (Signed)
Primary Care Physician: [name], [name], MD Primary Cardiologist: Dr Izora Ribas Primary Electrophysiologist: Dr Lalla Brothers (new) Referring Physician: Dr Herminio Commons [name] is a 75 y.o. 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  The patient was initially diagnosed with atrial flutter on a cardiac monitor 12/24/20 with Zio patch showing both atrial flutter and atrial tachycardia 37% burden with rapid rates. The monitor was placed for symptoms of palpitations. Of note, she was diagnosed with COVID 11/22/20.  She has a CHADS2VASC score of 5. Her diltiazem was increased to 240 mg daily on 2/3 and she reports her palpitations have improved although still present. Patient did have some increased lower extremity edema and SOB. She admits she skipped several doses of her Lasix. She resumed the medication and her symptoms resolved.   Patient was admitted 2/24-2/28/22 with acute respiratory failure secondary to acute on chronic diastolic CHF, rapid atrial fibrillation, and COPD exacerbation. She was transitioned to torsemide. She spontaneously converted to SR with treatment of COPD and diureses. She was seen on follow up 02/05/21 and was back in symptomatic afib. Patient is s/p TEE guided DCCV on 02/12/21.   On follow up today, patient presents for dofetilide admission. She remains in SR. She denies any missed doses of anticoagulation in the last 3 weeks.   Today, she denies symptoms of palpitations, chest pain, orthopnea, PND, dizziness, presyncope, syncope, snoring, daytime somnolence, bleeding, or neurologic sequela. The patient is tolerating medications without difficulties and is otherwise without complaint today.    Atrial Fibrillation Risk Factors:  she does not have symptoms or diagnosis of sleep apnea. she does not have a  history of rheumatic fever.   she has a BMI of Body mass index is 40.4 kg/m.Marland Kitchen Filed Weights   03/18/21 1109  Weight: 90.7 kg    Family History  Problem Relation Age of Onset  . Breast cancer Mother   . Coronary artery disease Father   . Rheum arthritis Father      Atrial Fibrillation Management history:  Previous antiarrhythmic drugs: none Previous cardioversions: 02/12/21 Previous ablations: none CHADS2VASC score: 5 Anticoagulation history: Eliquis   Past Medical History:  Diagnosis Date  . Arthritis    OA RIGHT KNEE WITH PAIN  . Barrett esophagus   . Bradycardia 06/01/2015  . CAD in native artery    a. NSTEMI 05/2015 s/p emergent CABG.  . Chronic diastolic (congestive) heart failure (HCC)   . Chronic kidney disease, stage 3a (HCC)   . Chronic respiratory failure (HCC)   . Chronic respiratory failure with hypoxia and hypercapnia (HCC) 02/04/2010   Followed in Pulmonary clinic/ [facility]       - 02 dependent  since 07/02/10 >>  83% RA December 05, 2010       - ONO RA 08/05/12  :  Positive sat < 89 x 2:102m> repeat on 2lpm rec 08/12/2012  - 06/17/2013 reported desat with activity p Knee surgery > rec restart 2lpm with activity  - 06/27/2013   Walked 2lpm  x one lap @ 185 stopped due to sat 88% not sob , desat to 82% on RA just at th  . COPD III spirometry if use FEV1/VC p saba  07/18/2010   Quit smoking May 2006       - PFT's  04/12/10 FEV1  1.21 (69%) ratio 77 and no change p B2,  DLC0 56%   VC 70%         - PFTs  08/08/2013 FEV1 1.21 (60%) ratio 86 and no change p B2 DLCO 79%  VC 72%  On symbicort 160 2bid  - PFT's  02/08/2018  FEV1 0.70 (40 % ) ratio 56 if use FEV1/VC  p 38 % improvement from saba p symb 160 prior to study with DLCO  78 % corrects to 147  % for alv volume   - 02/08/2018  . Cough variant asthma 02/26/2011   Followed in Pulmonary clinic/ [facility]  - PFT's  06/04/15  FEV1 1.20 (67 % ) ratio 83  p 6 % improvement from saba with DLCO  80 % corrects  to 132 % for alv volume      - Clinical dx based on response to symbicort       FENO 09/16/2016  =   96 on symbicort 160 2bid > added singulair  Allergy profile 09/16/2016 >  Eos 0.5 /  IgE  78 neg RAST  -  Referred to rehab 04/29/2017 > completed  . Essential hypertension 04/20/2007   Qualifier: Diagnosis of  By: Marcelyn Ditty RN, Katy Fitch   . GERD (gastroesophageal reflux disease)   . History of ARDS 2006  . History of home oxygen therapy    AT NIGHT WHEN SLEEPING 2 L / MIN NASAL CANNULA  . Hyperlipidemia 07/12/2015  . Hypothyroidism   . Intracranial hemorrhage (HCC) 2019   a. small intracranial hemorrhage in setting of HTN.  . Mild carotid artery disease (HCC)    a. Duplex 1-39% bilaterally in 2016.  . Morbid (severe) obesity due to excess calories (HCC) 04/22/2015   pfts with erv 14% 06/04/15  And 33% 02/08/2018   . NSTEMI (non-ST elevated myocardial infarction) (HCC) 05/31/2015  . Pneumococcal pneumonia (HCC) 2006   HOSPITALIZED AND DEVELOPED ARDS  . Psoriatic arthritis (HCC)   . PULMONARY FIBROSIS ILD POST INFLAMMATORY CHRONIC 07/18/2010   Followed as Primary Care Patient/ [facility]  -s/p ARDS 2006 with bacteremic S  Pna       - CT chest 07/03/10 Nonspecific PF mostly upper lobes       - CT chest 12/03/10 acute gg changes and effusions c/w chf - PFT's  02/08/2018  FVC 0.64 (28 %)   with DLCO  78 % corrects to 147 % for alv volume     . Rheumatic disease   . 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 SOB (shortness of breath) on exertion    Past Surgical History:  Procedure Laterality Date  . ABDOMINOPLASTY    . CARDIAC CATHETERIZATION N/A 06/01/2015   Procedure: Left Heart Cath and Coronary Angiography;  Surgeon: Runell Gess, MD;  Location: [facility] INVASIVE CV LAB;  Service: Cardiovascular;  Laterality: N/A;  . CARDIOVERSION N/A 02/12/2021   Procedure: CARDIOVERSION;  Surgeon: Meriam Sprague, MD;  Location: [facility] ENDOSCOPY;  Service: Cardiovascular;  Laterality: N/A;  . CARPAL TUNNEL RELEASE    .  CHOLECYSTECTOMY    . CORONARY ARTERY BYPASS GRAFT N/A 06/04/2015   Procedure: CORONARY ARTERY BYPASS GRAFT times three            with left internal mammary artery and right leg saphenous vein;  Surgeon: Alleen Borne, MD;  Location: [facility] OR;  Service: Open Heart Surgery;  Laterality: N/A;  . cosmetic breast surgery    . JOINT REPLACEMENT    .  KNEE ARTHROSCOPY Left   . TEE WITHOUT CARDIOVERSION  06/04/2015   Procedure: TRANSESOPHAGEAL ECHOCARDIOGRAM (TEE);  Surgeon: Alleen Borne, MD;  Location: [facility] OR;  Service: Open Heart Surgery;;  . TEE WITHOUT CARDIOVERSION N/A 02/12/2021   Procedure: TRANSESOPHAGEAL ECHOCARDIOGRAM (TEE);  Surgeon: Meriam Sprague, MD;  Location: [facility] ENDOSCOPY;  Service: Cardiovascular;  Laterality: N/A;  . TOTAL KNEE ARTHROPLASTY Left   . TOTAL KNEE ARTHROPLASTY Right 06/06/2013   Procedure: RIGHT TOTAL KNEE ARTHROPLASTY;  Surgeon: Loanne Drilling, MD;  Location: [facility] ORS;  Service: Orthopedics;  Laterality: Right;    Current Outpatient Medications  Medication Sig Dispense Refill  . acetaminophen (TYLENOL) 650 MG CR tablet Take 650 mg by mouth 2 (two) times daily.    Marland Kitchen albuterol (VENTOLIN HFA) 108 (90 Base) MCG/ACT inhaler Inhale 2 puffs into the lungs every 4 (four) hours as needed (shortness of breath, if you can't catch your breath). 8 g 5  . amoxicillin (AMOXIL) 500 MG tablet Take 1,000 mg by mouth See admin instructions. Dental procedure    . apixaban (ELIQUIS) 5 MG TABS tablet Take 1 tablet (5 mg total) by mouth 2 (two) times daily. 180 tablet 1  . atorvastatin (LIPITOR) 40 MG tablet TAKE 1 TABLET BY MOUTH  DAILY AT 6 PM 90 tablet 3  . budesonide (PULMICORT) 0.25 MG/2ML nebulizer solution One twice daily 120 mL 12  . CALCIUM PO Take 1 tablet by mouth daily.    . Cholecalciferol (VITAMIN D3) 2000 UNITS TABS Take 2,000 Int'l Units by mouth daily.    . cyclobenzaprine (FLEXERIL) 5 MG tablet TAKE 1 TABLET BY MOUTH AT  BEDTIME AS NEEDED FOR  MUSCLE SPASM(S) (Patient taking  differently: TAKE 1 TABLET BY MOUTH AT  BEDTIME AS NEEDED FOR  MUSCLE SPASM(S)) 90 tablet 0  . diltiazem (CARDIZEM CD) 240 MG 24 hr capsule Take 1 capsule (240 mg total) by mouth daily. 90 capsule 1  . DULoxetine (CYMBALTA) 30 MG capsule Take 1 capsule (30 mg total) by mouth daily. 90 capsule 1  . ferrous sulfate 325 (65 FE) MG tablet Take 1 tablet (325 mg total) by mouth daily with breakfast. 30 tablet 0  . formoterol (PERFOROMIST) 20 MCG/2ML nebulizer solution Take 2 mLs (20 mcg total) by nebulization 2 (two) times daily. Use in nebulizer twice daily perfectly regularly 120 mL 11  . golimumab (SIMPONI ARIA) 50 MG/4ML SOLN injection Inject 50 mg into the vein every 8 (eight) weeks.     . hydrocortisone 2.5 % cream Apply 1 application topically 2 (two) times daily as needed (itchiness).    Marland Kitchen ketoconazole (NIZORAL) 2 % cream Apply 1 application topically daily as needed for irritation.    Marland Kitchen leflunomide (ARAVA) 20 MG tablet Take 1 tablet (20 mg total) by mouth daily.    Marland Kitchen levothyroxine (SYNTHROID) 125 MCG tablet TAKE 1 TABLET BY MOUTH EVERY DAY 90 tablet 1  . losartan (COZAAR) 50 MG tablet Take 1 tablet (50 mg total) by mouth daily. 90 tablet 3  . montelukast (SINGULAIR) 10 MG tablet Take 1 tablet (10 mg total) by mouth at bedtime. 90 tablet 3  . nystatin (MYCOSTATIN/NYSTOP) powder APPLY 1 APPLICATION        TOPICALLY 3 TIMES A DAY 15 g 0  . OXYGEN Inhale 2 L into the lungs continuous. continuous o2    . pantoprazole (PROTONIX) 40 MG tablet Take 1 tablet (40 mg total) by mouth daily before breakfast. 90 tablet 3  . pregabalin (LYRICA) 50 MG  capsule Take 50 mg by mouth at bedtime.    . Tiotropium Bromide Monohydrate (SPIRIVA RESPIMAT) 2.5 MCG/ACT AERS Inhale 2 puffs into the lungs daily. 12 g 3  . torsemide (DEMADEX) 20 MG tablet Take 1 tablet (20 mg total) by mouth daily. 90 tablet 3  . traMADol (ULTRAM) 50 MG tablet Take 1 tablet (50 mg total) by mouth 2 (two) times daily. (Patient taking  differently: Take 50 mg by mouth 4 (four) times daily.) 60 tablet 1  . triamcinolone cream (KENALOG) 0.1 % Apply 1 application topically 2 (two) times daily as needed (for psoriasis).     Marland Kitchen umeclidinium bromide (INCRUSE ELLIPTA) 62.5 MCG/INH AEPB Inhale 1 puff into the lungs daily. 3 each 2  . vitamin B-12 (CYANOCOBALAMIN) 1000 MCG tablet Take 1,000 mcg by mouth daily.     No current facility-administered medications for this encounter.    Allergies  Allergen Reactions  . Gabapentin Other (See Comments)    Dizziness, lighthead    Social History   Socioeconomic History  . Marital status: Widowed    Spouse name: Not on file  . Number of children: Not on file  . Years of education: Not on file  . Highest education level: Not on file  Occupational History  . Occupation: retired    Associate Professor: Designer, jewellery  Tobacco Use  . Smoking status: Former Smoker    Packs/day: 1.50    Years: 58.50    Pack years: 87.75    Types: Cigarettes    Quit date: 03/03/2005    Years since quitting: 16.0  . Smokeless tobacco: Never Used  Vaping Use  . Vaping Use: Never used  Substance and Sexual Activity  . Alcohol use: No  . Drug use: No  . Sexual activity: Not on file  Other Topics Concern  . Not on file  Social History Narrative  . Not on file   Social Determinants of Health   Financial Resource Strain: Low Risk   . Difficulty of Paying Living Expenses: Not hard at all  Food Insecurity: Not on file  Transportation Needs: No Transportation Needs  . Lack of Transportation (Medical): No  . Lack of Transportation (Non-Medical): No  Physical Activity: Not on file  Stress: Not on file  Social Connections: Not on file  Intimate Partner Violence: Not on file     ROS- All systems are reviewed and negative except as per the HPI above.  Physical Exam: Vitals:   03/18/21 1109  BP: (!) 160/76  Pulse: 87  Weight: 90.7 kg  Height: 4\' 11"  (1.499 m)    GEN- The patient is a well appearing  obese elderly female, alert and oriented x 3 today.   HEENT-head normocephalic, atraumatic, sclera clear, conjunctiva pink, hearing intact, trachea midline. Lungs- Clear to ausculation bilaterally, normal work of breathing Heart- Regular rate and rhythm, no murmurs, rubs or gallops  GI- soft, NT, ND, + BS Extremities- no clubbing, cyanosis, or edema MS- no significant deformity or atrophy Skin- no rash or lesion Psych- euthymic mood, full affect Neuro- strength and sensation are intact   Wt Readings from Last 3 Encounters:  03/18/21 90.7 kg  02/14/21 90.6 kg  02/12/21 89.4 kg    EKG today demonstrates  SR, inc RBBB Vent. rate 87 BPM PR interval 190 ms QRS duration 92 ms QT/QTcB 360/433 ms  Echo 12/27/20 demonstrated  1. Left ventricular ejection fraction, by estimation, is 60 to 65%. The  left ventricle has normal function. The left ventricle  has no regional  wall motion abnormalities. There is severe left ventricular hypertrophy.  Left ventricular diastolic parameters  are indeterminate.  2. Right ventricular systolic function is normal. The right ventricular  size is normal.  3. Left atrial size was moderately dilated.  4. The mitral valve is normal in structure. No evidence of mitral valve  regurgitation. No evidence of mitral stenosis. Moderate mitral annular  calcification.  5. The aortic valve is normal in structure. Aortic valve regurgitation is  not visualized. No aortic stenosis is present.  6. The inferior vena cava is dilated in size with <50% respiratory  variability, suggesting right atrial pressure of 15 mmHg.  Epic records are reviewed at length today  CHA2DS2-VASc Score = 7  The patient's score is based upon: CHF History: Yes HTN History: Yes Diabetes History: No Stroke History: Yes Vascular Disease History: Yes Age Score: 1 Gender Score: 1      ASSESSMENT AND PLAN: 1. Persistent atrial fibrillation/Atrial flutter The patient's  CHA2DS2-VASc score is 7, indicating a 11.2% annual risk of stroke.   Patient presents for dofetilide admission.  Patient aware of price of dofetilide. Continue Eliquis 5 mg BID, states no missed doses in the last 3 weeks. No recent benadryl use PharmD has screened medications. Celexa has been transitioned to Cymbalta.  QTc in SR 433 ms Labs today show creatinine at 1.27, K+ 4.4 and mag 2.0, CrCl calculated at 56 mL/min Continue diltiazem 240 mg daily  2. Secondary Hypercoagulable State (ICD10:  D68.69) The patient is at significant risk for stroke/thromboembolism based upon her CHA2DS2-VASc Score of 7.  Continue Apixaban (Eliquis).   3. Obesity Body mass index is 40.4 kg/m. Lifestyle modification was discussed and encouraged including regular physical activity and weight reduction.  4. CAD S/p CABG No anginal symptoms.  5. HTN Stable, no changes today.  6. Chronic diastolic CHF Weight stable. No signs or symptoms of fluid overload today.   To be admitted later today once a bed becomes available.    Jorja Loa PA-C [facility] [address] [place], [place] [address] [phone] 03/18/2021 11:35 AM

## 2021-03-19 LAB — MRSA PCR SCREENING: MRSA by PCR: NEGATIVE

## 2021-03-19 LAB — BASIC METABOLIC PANEL
Anion gap: 7 (ref 5–15)
BUN: 45 mg/dL — ABNORMAL HIGH (ref 8–23)
CO2: 34 mmol/L — ABNORMAL HIGH (ref 22–32)
Calcium: 9 mg/dL (ref 8.9–10.3)
Chloride: 98 mmol/L (ref 98–111)
Creatinine, Ser: 1.5 mg/dL — ABNORMAL HIGH (ref 0.44–1.00)
GFR, Estimated: 36 mL/min — ABNORMAL LOW (ref >60)
Glucose, Bld: 110 mg/dL — ABNORMAL HIGH (ref 70–99)
Potassium: 4.1 mmol/L (ref 3.5–5.1)
Sodium: 139 mmol/L (ref 135–145)

## 2021-03-19 LAB — MAGNESIUM: Magnesium: 1.9 mg/dL (ref 1.7–2.4)

## 2021-03-19 MED ORDER — MAGNESIUM SULFATE 2 GM/50ML IV SOLN
2.0000 g | Freq: Once | INTRAVENOUS | Status: AC
  Administered 2021-03-19: 2 g via INTRAVENOUS
  Filled 2021-03-19: qty 50

## 2021-03-19 NOTE — Progress Notes (Addendum)
Electrophysiology Rounding Note  Patient Name: [name] Date of Encounter: 03/19/2021  Primary Cardiologist: Tobias Alexander, MD (Inactive)  Electrophysiologist: Dr. Ladona Ridgel   Subjective   Pt  remains in NSR  on Tikosyn 250 mcg BID   QTc from EKG last pm shows stable QTc at ~450 ms  The patient is doing well today.  At this time, the patient denies chest pain, shortness of breath, or any new concerns.  Inpatient Medications    Scheduled Meds:  apixaban  5 mg Oral BID   arformoterol  15 mcg Nebulization Q12H   atorvastatin  40 mg Oral q1800   budesonide  0.25 mg Nebulization BID   cholecalciferol  2,000 Units Oral Daily   cyclobenzaprine  5 mg Oral QHS   diltiazem  240 mg Oral Daily   dofetilide  250 mcg Oral BID   DULoxetine  30 mg Oral Daily   ferrous sulfate  325 mg Oral Q breakfast   leflunomide  20 mg Oral Daily   levothyroxine  125 mcg Oral Q0600   losartan  50 mg Oral Daily   montelukast  10 mg Oral QHS   pantoprazole  40 mg Oral QAC breakfast   pregabalin  50 mg Oral QHS   sodium chloride flush  3 mL Intravenous Q12H   torsemide  20 mg Oral Daily   traMADol  50 mg Oral QID   umeclidinium bromide  1 puff Inhalation Daily   vitamin B-12  1,000 mcg Oral Daily   Continuous Infusions:  sodium chloride     PRN Meds: sodium chloride, albuterol, sodium chloride flush   Vital Signs    Vitals:   03/18/21 1948 03/19/21 0007 03/19/21 0353 03/19/21 0530  BP: 114/61 (!) 117/47 (!) 134/99 136/90  Pulse: 96 88 96 87  Resp: 20 16 15 19   Temp: 99.1 F (37.3 C) 99 F (37.2 C) 97.8 F (36.6 C)   TempSrc: Oral Oral Oral   SpO2: 95% 97% 100% 97%  Height:        Intake/Output Summary (Last 24 hours) at 03/19/2021 0708 Last data filed at 03/18/2021 1700 Gross per 24 hour  Intake 240 ml  Output --  Net 240 ml   There were no vitals filed for this visit.  Physical Exam    GEN- The patient is well appearing, alert and oriented x 3 today.   Head-  normocephalic, atraumatic Eyes-  Sclera clear, conjunctiva pink Ears- hearing intact Oropharynx- clear Neck- supple Lungs- Clear to ausculation bilaterally, normal work of breathing Heart- Regular rate and rhythm, no murmurs, rubs or gallops GI- soft, NT, ND, + BS Extremities- no clubbing, cyanosis, or edema Skin- no rash or lesion Psych- euthymic mood, full affect Neuro- strength and sensation are intact  Labs    CBC No results for input(s): WBC, NEUTROABS, HGB, HCT, MCV, PLT in the last 72 hours. Basic Metabolic Panel Recent Labs    03/20/2021 1108 03/19/21 0237  NA 140 139  K 4.4 4.1  CL 98 98  CO2 37* 34*  GLUCOSE 110* 110*  BUN 37* 45*  CREATININE 1.27* 1.50*  CALCIUM 9.6 9.0  MG 2.0 1.9    Potassium  Date/Time Value Ref Range Status  03/19/2021 02:37 AM 4.1 3.5 - 5.1 mmol/L Final   Magnesium  Date/Time Value Ref Range Status  03/19/2021 02:37 AM 1.9 1.7 - 2.4 mg/dL Final    Comment:    Performed at [facility], [address],  [place], [place] [address]    Telemetry    NSR 80-90s with PACs (personally reviewed)  Radiology    No results found.   Patient Profile     [name] is a 75 y.o. female with a past medical history significant for persistent atrial fibrillation.  They were admitted for tikosyn load.   Assessment & Plan    Persistent atrial fibrillation Pt  remains in NSR  on Tikosyn 250 mcg BID  Continue Eliquis K 4.1. Mg 1.9. Will supp Mg.  CHA2DS2VASC is at least 5.  2. CHronic diastolic CHF Volume status stable on exam. Follow  3. CAD s/p CABG No s/s of ischemia  She has thus far been in NSR and will not likely require [facility].    For questions or updates, please contact [facility] Please consult [email or web address] for contact info under Cardiology/STEMI.  Signed, Graciella Freer, PA-C  03/19/2021, 7:08 AM   EP Attending  Patient seen and examined. Agree with the findings as noted above. The patient is  doing well and is maintaining NSR. Her QT is acceptable. She will continue dofetilide.   Sharlot Gowda Phillips Goulette,MD

## 2021-03-19 NOTE — Progress Notes (Signed)
Morning EKG reviewed  Shows remains in NSR at 86 bpm with stable QTc at ~466 ms.  Continue Tikosyn 250 mcg BID.   Pt should not require DCC.   Graciella Freer, PA-C  Pager: [phone]  03/19/2021 11:39 AM

## 2021-03-19 NOTE — Progress Notes (Signed)
Pharmacy: Dofetilide (Tikosyn) - Follow Up Assessment and Electrolyte Replacement  Pharmacy consulted to assist in monitoring and replacing electrolytes in this 75 y.o. female admitted on 03/18/2021 undergoing dofetilide initiation. First dofetilide dose: 03/18/21.  Labs:    Component Value Date/Time   K 4.1 03/19/2021 0237   MG 1.9 03/19/2021 0237     Plan: Potassium: K >/= 4: No additional supplementation needed  Magnesium: Mg 1.8-2: Give Mg 2 gm IV x1    Thank you for allowing pharmacy to participate in this patient's care   Sheppard Coil PharmD., BCPS Clinical Pharmacist 03/19/2021 1:34 PM

## 2021-03-19 NOTE — Progress Notes (Signed)
Mobility Specialist - Progress Note   03/19/21 1347  Mobility  Activity Ambulated in hall  Level of Assistance Minimal assist, patient does 75% or more  Assistive Device Front wheel walker  Distance Ambulated (ft) 50 ft  Mobility Ambulated with assistance in hallway  Mobility Response Tolerated well  Mobility performed by Mobility specialist;Family member  $Mobility charge 1 Mobility   Pt reports her distance w/ ambulation is limited due to sciatic nerve issues that cause her knees to buckle. Chair follow by her daughter for this reason. Pt states she felt stronger during this walk than previous ones. HR remained in 90s throughout, SpO2 remained >98% on 2L O2. Pt back in chair after walk, daughter in room.   Mamie Levers Mobility Specialist Mobility Specialist Phone: [phone]

## 2021-03-19 NOTE — TOC Benefit Eligibility Note (Signed)
Transition of Care [facility]) Benefit Eligibility Note    Patient Details  Name: [name] MRN: [id number] Date of Birth: [birth year]   Medication/Dose: dofetilide  Covered?: Yes     Prescription Coverage [facility]erred [facility]: [facility]  Spoke with Person/Company/Phone Number:: [facility]  Co-Pay: $138.37 for 30 days retail  Prior Approval: No          Oralia Rud Annalysse Shoemaker Phone Number: 03/19/2021, 11:32 AM

## 2021-03-20 LAB — BASIC METABOLIC PANEL
Anion gap: 7 (ref 5–15)
BUN: 36 mg/dL — ABNORMAL HIGH (ref 8–23)
CO2: 33 mmol/L — ABNORMAL HIGH (ref 22–32)
Calcium: 9.1 mg/dL (ref 8.9–10.3)
Chloride: 99 mmol/L (ref 98–111)
Creatinine, Ser: 1.26 mg/dL — ABNORMAL HIGH (ref 0.44–1.00)
GFR, Estimated: 45 mL/min — ABNORMAL LOW (ref >60)
Glucose, Bld: 101 mg/dL — ABNORMAL HIGH (ref 70–99)
Potassium: 4.1 mmol/L (ref 3.5–5.1)
Sodium: 139 mmol/L (ref 135–145)

## 2021-03-20 LAB — MAGNESIUM: Magnesium: 2.4 mg/dL (ref 1.7–2.4)

## 2021-03-20 NOTE — Progress Notes (Signed)
Mobility Specialist - Progress Note   03/20/21 1601  Mobility  Activity Ambulated in hall  Level of Assistance Standby assist, set-up cues, supervision of patient - no hands on  Assistive Device Front wheel walker  Distance Ambulated (ft) 36 ft  Mobility Ambulated with assistance in hallway  Mobility Response Tolerated well  Mobility performed by Mobility specialist  $Mobility charge 1 Mobility   Pre-mobility: 93  HR, 98%  SpO2 During mobility: 108 HR Post-mobility: 97  HR, 98% SpO2  Distance limited due to chair follow being unavailable. Pt back in bed after walk, call bell at side. VSS throughout.  Mamie Levers Mobility Specialist Mobility Specialist Phone: [phone]

## 2021-03-20 NOTE — Progress Notes (Signed)
Pharmacy: Dofetilide (Tikosyn) - Follow Up Assessment and Electrolyte Replacement  Pharmacy consulted to assist in monitoring and replacing electrolytes in this 75 y.o. female admitted on 03/18/2021 undergoing dofetilide initiation. First dofetilide dose: 03/18/21.  Labs:    Component Value Date/Time   K 4.1 03/20/2021 0312   MG 2.4 03/20/2021 8453     Plan: Potassium: K >/= 4: No additional supplementation needed  Magnesium: Mg > 2: No additional supplementation needed   Thank you for allowing pharmacy to participate in this patient's care   Sheppard Coil PharmD., BCPS Clinical Pharmacist 03/20/2021 10:46 AM

## 2021-03-20 NOTE — Progress Notes (Addendum)
Electrophysiology Rounding Note  Patient Name: [name] Date of Encounter: 03/20/2021  Primary Cardiologist: Tobias Alexander, MD (Inactive)  Electrophysiologist: Dr. Ladona Ridgel   Subjective   Pt  remains in NSR  on Tikosyn 250 mcg BID   QTc from EKG last pm shows stable QTc at ~480 or better  The patient is doing well today.  At this time, the patient denies chest pain, shortness of breath, or any new concerns.  Inpatient Medications    Scheduled Meds:  apixaban  5 mg Oral BID   arformoterol  15 mcg Nebulization Q12H   atorvastatin  40 mg Oral q1800   budesonide  0.25 mg Nebulization BID   cholecalciferol  2,000 Units Oral Daily   cyclobenzaprine  5 mg Oral QHS   diltiazem  240 mg Oral Daily   dofetilide  250 mcg Oral BID   DULoxetine  30 mg Oral Daily   ferrous sulfate  325 mg Oral Q breakfast   leflunomide  20 mg Oral Daily   levothyroxine  125 mcg Oral Q0600   losartan  50 mg Oral Daily   montelukast  10 mg Oral QHS   pantoprazole  40 mg Oral QAC breakfast   pregabalin  50 mg Oral QHS   sodium chloride flush  3 mL Intravenous Q12H   torsemide  20 mg Oral Daily   traMADol  50 mg Oral QID   umeclidinium bromide  1 puff Inhalation Daily   vitamin B-12  1,000 mcg Oral Daily   Continuous Infusions:  sodium chloride Stopped (03/19/21 1059)   PRN Meds: sodium chloride, albuterol, sodium chloride flush   Vital Signs    Vitals:   03/19/21 1607 03/19/21 2006 03/19/21 2009 03/20/21 0343  BP: 117/75 (!) 132/104  106/75  Pulse: 85 94  83  Resp: 17 20  19   Temp: 98.7 F (37.1 C) 99.3 F (37.4 C)  98.1 F (36.7 C)  TempSrc: Oral Oral  Axillary  SpO2: 100% 95% 94% 97%  Height:        Intake/Output Summary (Last 24 hours) at 03/20/2021 03/22/2021 Last data filed at 03/19/2021 1839 Gross per 24 hour  Intake 23.09 ml  Output --  Net 23.09 ml   There were no vitals filed for this visit.  Physical Exam    GEN- The patient is well appearing, alert and  oriented x 3 today.   Head- normocephalic, atraumatic Eyes-  Sclera clear, conjunctiva pink Ears- hearing intact Oropharynx- clear Neck- supple Lungs- Clear to ausculation bilaterally, normal work of breathing Heart- Regular rate and rhythm, no murmurs, rubs or gallops GI- soft, NT, ND, + BS Extremities- no clubbing, cyanosis, or edema Skin- no rash or lesion Psych- euthymic mood, full affect Neuro- strength and sensation are intact  Labs    CBC No results for input(s): WBC, NEUTROABS, HGB, HCT, MCV, PLT in the last 72 hours. Basic Metabolic Panel Recent Labs    03/21/2021 0237 03/20/21 0312  NA 139 139  K 4.1 4.1  CL 98 99  CO2 34* 33*  GLUCOSE 110* 101*  BUN 45* 36*  CREATININE 1.50* 1.26*  CALCIUM 9.0 9.1  MG 1.9 2.4    Potassium  Date/Time Value Ref Range Status  03/20/2021 03:12 AM 4.1 3.5 - 5.1 mmol/L Final   Magnesium  Date/Time Value Ref Range Status  03/20/2021 03:12 AM 2.4 1.7 - 2.4 mg/dL Final    Comment:    Performed at [facility], [address] [place], [place] [address]    Telemetry    NSR with PACs in 70-90s (personally reviewed)  Radiology    No results found.   Patient Profile     [name] is a 75 y.o. female with a past medical history significant for persistent atrial fibrillation.  They were admitted for tikosyn load.   Assessment & Plan    Persistent atrial fibrillation Pt  remains in NSR  on Tikosyn 250 mcg BID  Continue Eliquis Electrolytes stable. K 4.1, Mg 2.4 CHA2DS2VASC is at least 5.  Pt will not require DCC . Plan home tomorrow if QTc remains stable.    For questions or updates, please contact [facility] Please consult [email or web address] for contact info under Cardiology/STEMI.  Signed, Graciella Freer, PA-C  03/20/2021, 7:02 AM   EP Attending  Patient seen and examined. Agree with above. The patient is maintaining NSR. Her QT is acceptable. She will be discharged home tomorrow if her  QT is ok.   Sharlot Gowda Sandralee Tarkington,MD

## 2021-03-20 NOTE — Progress Notes (Signed)
Morning EKG reviewed  Shows remains in NSR at 90 bpm with borderline QTc at ~490 ms.  Continue Tikosyn 250 mcg BID.   Pt will not require DCCV. Plan for home tomorrow if QTc remains stable.    Graciella Freer, PA-C  Pager: [phone]  03/20/2021 11:10 AM

## 2021-03-21 ENCOUNTER — Other Ambulatory Visit (HOSPITAL_COMMUNITY): Payer: Self-pay

## 2021-03-21 LAB — BASIC METABOLIC PANEL
Anion gap: 6 (ref 5–15)
BUN: 40 mg/dL — ABNORMAL HIGH (ref 8–23)
CO2: 34 mmol/L — ABNORMAL HIGH (ref 22–32)
Calcium: 8.9 mg/dL (ref 8.9–10.3)
Chloride: 98 mmol/L (ref 98–111)
Creatinine, Ser: 1.65 mg/dL — ABNORMAL HIGH (ref 0.44–1.00)
GFR, Estimated: 32 mL/min — ABNORMAL LOW (ref >60)
Glucose, Bld: 121 mg/dL — ABNORMAL HIGH (ref 70–99)
Potassium: 4.4 mmol/L (ref 3.5–5.1)
Sodium: 138 mmol/L (ref 135–145)

## 2021-03-21 LAB — MAGNESIUM: Magnesium: 2.3 mg/dL (ref 1.7–2.4)

## 2021-03-21 MED ORDER — DOFETILIDE 250 MCG PO CAPS
250.0000 ug | ORAL_CAPSULE | Freq: Two times a day (BID) | ORAL | 6 refills | Status: DC
  Filled 2021-03-21: qty 60, 30d supply, fill #0

## 2021-03-21 NOTE — Discharge Summary (Addendum)
ELECTROPHYSIOLOGY PROCEDURE DISCHARGE SUMMARY    Patient ID: [name],  MRN: [id number], DOB/AGE: Sep 08, 1946 75 y.o.  Admit date: 03/18/2021 Discharge date: 03/21/2021  Primary Care Physician: [name], [name], MD  Primary Cardiologist: Tobias Alexander, MD (Inactive)  Electrophysiologist: None   Primary Discharge Diagnosis:  1. Persistent atrial fibrillation status post Tikosyn loading this admission  Allergies  Allergen Reactions  . Gabapentin Other (See Comments)    Dizziness, lighthead     Procedures This Admission:  1.  Tikosyn loading  Brief HPI: [name] is a 75 y.o. female with a past medical history as noted above.  They were referred to EP in the outpatient setting for treatment options of atrial fibrillation.  Risks, benefits, and alternatives to Tikosyn were reviewed with the patient who wished to proceed.    Hospital Course:  The patient was admitted and Tikosyn was initiated.  Renal function and electrolytes were followed during the hospitalization.  Their QTc remained stable.  Pt presented in and maintained NSR and did not require direct current cardioversion.  They were monitored until discharge on telemetry which demonstrated NSR and PACs.  On the day of discharge, they were examined by Dr. Ladona Ridgel  who considered them stable for discharge to home.  Follow-up has been arranged with the Atrial Fibrillation clinic in approximately 1 week and with Dr. Ladona Ridgel  in 4 weeks.   Physical Exam: Vitals:   03/21/21 0431 03/21/21 0927 03/21/21 0930 03/21/21 0931  BP: (!) 131/57     Pulse: 85 84    Resp: 16 18    Temp: 97.7 F (36.5 C)     TempSrc: Oral     SpO2: 96% 96% 96% 96%  Height:        GEN- The patient is well appearing, alert and oriented x 3 today.   HEENT: normocephalic, atraumatic; sclera clear, conjunctiva pink; hearing intact; oropharynx clear; neck supple, no JVP Lymph- no cervical lymphadenopathy Lungs- Clear to  ausculation bilaterally, normal work of breathing.  No wheezes, rales, rhonchi Heart- Regular rate and rhythm, no murmurs, rubs or gallops, PMI not laterally displaced GI- soft, non-tender, non-distended, bowel sounds present, no hepatosplenomegaly Extremities- no clubbing, cyanosis, or edema; DP/PT/radial pulses 2+ bilaterally MS- no significant deformity or atrophy Skin- warm and dry, no rash or lesion Psych- euthymic mood, full affect Neuro- strength and sensation are intact   Labs:   Lab Results  Component Value Date   WBC 10.8 (H) 02/05/2021   HGB 11.3 (L) 02/05/2021   HCT 39.0 02/05/2021   MCV 99.5 02/05/2021   PLT 242 02/05/2021    Recent Labs  Lab 03/21/21 0209  NA 138  K 4.4  CL 98  CO2 34*  BUN 40*  CREATININE 1.65*  CALCIUM 8.9  GLUCOSE 121*     Discharge Medications:  Allergies as of 03/21/2021      Reactions   Gabapentin Other (See Comments)   Dizziness, lighthead      Medication List    TAKE these medications   acetaminophen 650 MG CR tablet Commonly known as: TYLENOL Take 650 mg by mouth every 8 (eight) hours.   albuterol 108 (90 Base) MCG/ACT inhaler Commonly known as: VENTOLIN HFA Inhale 2 puffs into the lungs every 4 (four) hours as needed (shortness of breath, if you can't catch your breath).   amoxicillin 500 MG tablet Commonly known as: AMOXIL Take 1,000 mg by mouth See admin instructions. Dental procedure   apixaban  5 MG Tabs tablet Commonly known as: Eliquis Take 1 tablet (5 mg total) by mouth 2 (two) times daily.   atorvastatin 40 MG tablet Commonly known as: LIPITOR TAKE 1 TABLET BY MOUTH  DAILY AT 6 PM   budesonide 0.25 MG/2ML nebulizer solution Commonly known as: Pulmicort One twice daily What changed:   how much to take  how to take this  when to take this   CALCIUM PO Take 1 tablet by mouth daily.   cyclobenzaprine 5 MG tablet Commonly known as: FLEXERIL TAKE 1 TABLET BY MOUTH AT  BEDTIME AS NEEDED FOR  MUSCLE  SPASM(S)   diltiazem 240 MG 24 hr capsule Commonly known as: CARDIZEM CD Take 1 capsule (240 mg total) by mouth daily.   dofetilide 250 MCG capsule Commonly known as: TIKOSYN Take 1 capsule (250 mcg total) by mouth 2 (two) times daily.   DULoxetine 30 MG capsule Commonly known as: Cymbalta Take 1 capsule (30 mg total) by mouth daily.   ferrous sulfate 325 (65 FE) MG tablet Take 1 tablet (325 mg total) by mouth daily with breakfast.   formoterol 20 MCG/2ML nebulizer solution Commonly known as: PERFOROMIST Take 2 mLs (20 mcg total) by nebulization 2 (two) times daily. Use in nebulizer twice daily perfectly regularly   golimumab 50 MG/4ML Soln injection Commonly known as: SIMPONI ARIA Inject 50 mg into the vein every 8 (eight) weeks.   hydrocortisone 2.5 % cream Apply 1 application topically 2 (two) times daily as needed (itchiness).   Incruse Ellipta 62.5 MCG/INH Aepb Generic drug: umeclidinium bromide Inhale 1 puff into the lungs daily.   ketoconazole 2 % cream Commonly known as: NIZORAL Apply 1 application topically daily as needed for irritation.   leflunomide 20 MG tablet Commonly known as: Arava Take 1 tablet (20 mg total) by mouth daily.   levothyroxine 125 MCG tablet Commonly known as: SYNTHROID TAKE 1 TABLET BY MOUTH EVERY DAY   losartan 50 MG tablet Commonly known as: COZAAR Take 1 tablet (50 mg total) by mouth daily.   nystatin powder Commonly known as: MYCOSTATIN/NYSTOP APPLY 1 APPLICATION        TOPICALLY 3 TIMES A DAY   OXYGEN Inhale 2 L into the lungs continuous. continuous o2   pantoprazole 40 MG tablet Commonly known as: PROTONIX Take 1 tablet (40 mg total) by mouth daily before breakfast.   pregabalin 50 MG capsule Commonly known as: LYRICA Take 50 mg by mouth at bedtime.   Spiriva Respimat 2.5 MCG/ACT Aers Generic drug: Tiotropium Bromide Monohydrate Inhale 2 puffs into the lungs daily.   torsemide 20 MG tablet Commonly known as:  DEMADEX Take 1 tablet (20 mg total) by mouth daily.   traMADol 50 MG tablet Commonly known as: ULTRAM Take 1 tablet (50 mg total) by mouth 2 (two) times daily. What changed: when to take this   triamcinolone cream 0.1 % Commonly known as: KENALOG Apply 1 application topically 2 (two) times daily as needed (for psoriasis).   vitamin B-12 1000 MCG tablet Commonly known as: CYANOCOBALAMIN Take 1,000 mcg by mouth daily.   Vitamin D3 50 MCG (2000 UT) Tabs Take 2,000 Int'l Units by mouth daily.       Disposition:    Follow-up Information    Fenton, Clint R, PA Follow up.   Specialty: Cardiology Why: on 5/26 at 1115 for post hospital tikosyn follow up Contact information: [address] [place] [address] [phone]  Duration of Discharge Encounter: Greater than 30 minutes including physician time.  Dustin Flock, PA-C  03/21/2021 11:08 AM  EP Attending  Patient seen and examined. Agree with the findings as noted above. The patient is doing well and maintaining NSR. Her QT is acceptable. She will be discharged home with the usual followup as described above.   Sharlot Gowda Junior Huezo,MD

## 2021-03-21 NOTE — Progress Notes (Signed)
03/20/2021 evening EKG reviewed  Shows remains in NSR at 85 bpm with stable QTc at ~480 ms. Tele stable.  Continue Tikosyn 250 mcg BID.   Electrolytes stable.    Plan for home this afternoon if QTc remains stable on current dosing.    Graciella Freer, PA-C  Pager: [phone]  03/21/2021 7:22 AM

## 2021-03-21 NOTE — Plan of Care (Signed)
  Problem: Education: Goal: Knowledge of disease or condition will improve Outcome: Adequate for Discharge Goal: Understanding of medication regimen will improve Outcome: Adequate for Discharge Goal: Individualized Educational Video(s) Outcome: Adequate for Discharge   Problem: Activity: Goal: Ability to tolerate increased activity will improve Outcome: Adequate for Discharge   Problem: Cardiac: Goal: Ability to achieve and maintain adequate cardiopulmonary perfusion will improve Outcome: Adequate for Discharge   Problem: Health Behavior/Discharge Planning: Goal: Ability to safely manage health-related needs after discharge will improve Outcome: Adequate for Discharge   

## 2021-03-21 NOTE — Plan of Care (Signed)
  Problem: Cardiac: Goal: Ability to achieve and maintain adequate cardiopulmonary perfusion will improve Outcome: Progressing   

## 2021-03-21 NOTE — Progress Notes (Addendum)
    Progress Note   Date: 03/20/2021  Patient Name: [name]        MRN#: [id number]   Review of the patient's clinical findings supports the diagnosis of acute renal failure and atypical atrial flutter.

## 2021-03-21 NOTE — Progress Notes (Signed)
Pharmacy: Dofetilide (Tikosyn) - Follow Up Assessment and Electrolyte Replacement  Pharmacy consulted to assist in monitoring and replacing electrolytes in this 75 y.o. female admitted on 03/18/2021 undergoing dofetilide initiation. First dofetilide dose: 03/18/21.  Labs:    Component Value Date/Time   K 4.4 03/21/2021 0209   MG 2.3 03/21/2021 0209     Plan: Potassium: K >/= 4: No additional supplementation needed  Magnesium: Mg > 2: No additional supplementation needed   Thank you for allowing pharmacy to participate in this patient's care   Sheppard Coil PharmD., BCPS Clinical Pharmacist 03/21/2021 11:10 AM

## 2021-03-27 ENCOUNTER — Telehealth: Payer: Self-pay | Admitting: Pharmacist

## 2021-03-27 DIAGNOSIS — M25561 Pain in right knee: Secondary | ICD-10-CM | POA: Diagnosis not present

## 2021-03-27 DIAGNOSIS — M25552 Pain in left hip: Secondary | ICD-10-CM | POA: Diagnosis not present

## 2021-03-27 DIAGNOSIS — M5416 Radiculopathy, lumbar region: Secondary | ICD-10-CM | POA: Diagnosis not present

## 2021-03-27 DIAGNOSIS — M25562 Pain in left knee: Secondary | ICD-10-CM | POA: Diagnosis not present

## 2021-03-27 NOTE — Chronic Care Management (AMB) (Signed)
    Chronic Care Management Pharmacy Assistant   Name: [name]  MRN: [id number] DOB: 1946-04-21  03/27/21- called patient to remind of appointment with CPP on (03/28/21 at 1pm by phone).   No answer, left message of appointment date, time and type of appointment (either telephone or in person). Left message to have all medications, supplements, blood pressure and/or blood sugar logs available during appointment and to return call if need to reschedule.  Star Rating Drug:   Atorvastatin 40mg  - last filled on 03/14/21 90DS at [facility]   Losartan 50mg  - last filled on 01/17/21 90DS at .   Any gaps in medications fill history?  NO.  01/19/21 CMA  Clinical Pharmacist Assistant [phone]

## 2021-03-27 NOTE — Progress Notes (Signed)
Primary Care Physician: [name], [name], MD Primary Cardiologist: Dr Izora Ribas Primary Electrophysiologist: Dr Ladona Ridgel  Referring Physician: Dr Herminio Commons [name] is a 75 y.o. female with a history of CAD s/p NSTEMI and CABG, COPD, HTN, HLD, ILD on Home O2, diastolic HF, and atrial flutter who presents for follow up in the [facility].  The patient was initially diagnosed with atrial flutter on a cardiac monitor 12/24/20 with Zio patch showing both atrial flutter and atrial tachycardia 37% burden with rapid rates. The monitor was placed for symptoms of palpitations. Of note, she was diagnosed with COVID 11/22/20.  She has a CHADS2VASC score of 5. Her diltiazem was increased to 240 mg daily on 2/3 and she reports her palpitations have improved although still present. Patient did have some increased lower extremity edema and SOB. She admits she skipped several doses of her Lasix. She resumed the medication and her symptoms resolved.   Patient was admitted 2/24-2/28/22 with acute respiratory failure secondary to acute on chronic diastolic CHF, rapid atrial fibrillation, and COPD exacerbation. She was transitioned to torsemide. She spontaneously converted to SR with treatment of COPD and diureses. She was seen on follow up 02/05/21 and was back in symptomatic afib. Patient is s/p TEE guided DCCV on 02/12/21.   On follow up today, patient is s/p dofetilide admission 5/16-5/19/22. She presented in SR and did not require DCCV. She has done well since leaving the hospital with no palpitations. Her chronic SOB is stable, no increased lower extremity edema. She denies any bleeding issues on anticoagulation.   Today, she denies symptoms of palpitations, chest pain, orthopnea, PND, dizziness, presyncope, syncope, snoring, daytime somnolence, bleeding, or neurologic sequela. The patient is tolerating medications without difficulties and is otherwise without  complaint today.    Atrial Fibrillation Risk Factors:  she does not have symptoms or diagnosis of sleep apnea. she does not have a history of rheumatic fever.   she has a BMI of Body mass index is 40.8 kg/m.Marland Kitchen Filed Weights   03/28/21 1121  Weight: 91.6 kg    Family History  Problem Relation Age of Onset  . Breast cancer Mother   . Coronary artery disease Father   . Rheum arthritis Father      Atrial Fibrillation Management history:  Previous antiarrhythmic drugs: dofetilide  Previous cardioversions: 02/12/21 Previous ablations: none CHADS2VASC score: 5 Anticoagulation history: Eliquis   Past Medical History:  Diagnosis Date  . Arthritis    OA RIGHT KNEE WITH PAIN  . Barrett esophagus   . Bradycardia 06/01/2015  . CAD in native artery    a. NSTEMI 05/2015 s/p emergent CABG.  . Chronic diastolic (congestive) heart failure (HCC)   . Chronic kidney disease, stage 3a (HCC)   . Chronic respiratory failure (HCC)   . Chronic respiratory failure with hypoxia and hypercapnia (HCC) 02/04/2010   Followed in Pulmonary clinic/ [facility]       - 02 dependent  since 07/02/10 >>  83% RA December 05, 2010       - ONO RA 08/05/12  :  Positive sat < 89 x 2:94m> repeat on 2lpm rec 08/12/2012  - 06/17/2013 reported desat with activity p Knee surgery > rec restart 2lpm with activity  - 06/27/2013   Walked 2lpm  x one lap @ 185 stopped due to sat 88% not sob , desat to 82% on RA just at th  . COPD III spirometry if  use FEV1/VC p saba  07/18/2010   Quit smoking May 2006       - PFT's  04/12/10 FEV1  1.21 (69%) ratio 77 and no change p B2,  DLC0 56%   VC 70%         - PFTs  08/08/2013 FEV1 1.21 (60%) ratio 86 and no change p B2 DLCO 79%  VC 72%  On symbicort 160 2bid  - PFT's  02/08/2018  FEV1 0.70 (40 % ) ratio 56 if use FEV1/VC  p 38 % improvement from saba p symb 160 prior to study with DLCO  78 % corrects to 147  % for alv volume   - 02/08/2018  . Cough variant asthma 02/26/2011   Followed  in Pulmonary clinic/ [facility]  - PFT's  06/04/15  FEV1 1.20 (67 % ) ratio 83  p 6 % improvement from saba with DLCO  80 % corrects to 132 % for alv volume      - Clinical dx based on response to symbicort       FENO 09/16/2016  =   96 on symbicort 160 2bid > added singulair  Allergy profile 09/16/2016 >  Eos 0.5 /  IgE  78 neg RAST  -  Referred to rehab 04/29/2017 > completed  . Essential hypertension 04/20/2007   Qualifier: Diagnosis of  By: Marcelyn Ditty RN, Katy Fitch   . GERD (gastroesophageal reflux disease)   . History of ARDS 2006  . History of home oxygen therapy    AT NIGHT WHEN SLEEPING 2 L / MIN NASAL CANNULA  . Hyperlipidemia 07/12/2015  . Hypothyroidism   . Intracranial hemorrhage (HCC) 2019   a. small intracranial hemorrhage in setting of HTN.  . Mild carotid artery disease (HCC)    a. Duplex 1-39% bilaterally in 2016.  . Morbid (severe) obesity due to excess calories (HCC) 04/22/2015   pfts with erv 14% 06/04/15  And 33% 02/08/2018   . NSTEMI (non-ST elevated myocardial infarction) (HCC) 05/31/2015  . Pneumococcal pneumonia (HCC) 2006   HOSPITALIZED AND DEVELOPED ARDS  . Psoriatic arthritis (HCC)   . PULMONARY FIBROSIS ILD POST INFLAMMATORY CHRONIC 07/18/2010   Followed as Primary Care Patient/ [facility]  -s/p ARDS 2006 with bacteremic S  Pna       - CT chest 07/03/10 Nonspecific PF mostly upper lobes       - CT chest 12/03/10 acute gg changes and effusions c/w chf - PFT's  02/08/2018  FVC 0.64 (28 %)   with DLCO  78 % corrects to 147 % for alv volume     . Rheumatic disease   . 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 SOB (shortness of breath) on exertion    Past Surgical History:  Procedure Laterality Date  . ABDOMINOPLASTY    . CARDIAC CATHETERIZATION N/A 06/01/2015   Procedure: Left Heart Cath and Coronary Angiography;  Surgeon: Runell Gess, MD;  Location: [facility] INVASIVE CV LAB;  Service: Cardiovascular;  Laterality: N/A;  . CARDIOVERSION N/A 02/12/2021   Procedure:  CARDIOVERSION;  Surgeon: Meriam Sprague, MD;  Location: [facility] ENDOSCOPY;  Service: Cardiovascular;  Laterality: N/A;  . CARPAL TUNNEL RELEASE    . CHOLECYSTECTOMY    . CORONARY ARTERY BYPASS GRAFT N/A 06/04/2015   Procedure: CORONARY ARTERY BYPASS GRAFT times three            with left internal mammary artery and right leg saphenous vein;  Surgeon: Alleen Borne, MD;  Location: [facility] OR;  Service: Open Heart Surgery;  Laterality: N/A;  . cosmetic breast surgery    . JOINT REPLACEMENT    . KNEE ARTHROSCOPY Left   . TEE WITHOUT CARDIOVERSION  06/04/2015   Procedure: TRANSESOPHAGEAL ECHOCARDIOGRAM (TEE);  Surgeon: Alleen Borne, MD;  Location: [facility] OR;  Service: Open Heart Surgery;;  . TEE WITHOUT CARDIOVERSION N/A 02/12/2021   Procedure: TRANSESOPHAGEAL ECHOCARDIOGRAM (TEE);  Surgeon: Meriam Sprague, MD;  Location: [facility] ENDOSCOPY;  Service: Cardiovascular;  Laterality: N/A;  . TOTAL KNEE ARTHROPLASTY Left   . TOTAL KNEE ARTHROPLASTY Right 06/06/2013   Procedure: RIGHT TOTAL KNEE ARTHROPLASTY;  Surgeon: Loanne Drilling, MD;  Location: [facility] ORS;  Service: Orthopedics;  Laterality: Right;    Current Outpatient Medications  Medication Sig Dispense Refill  . acetaminophen (TYLENOL) 650 MG CR tablet Take 650 mg by mouth every 8 (eight) hours.    Marland Kitchen albuterol (VENTOLIN HFA) 108 (90 Base) MCG/ACT inhaler Inhale 2 puffs into the lungs every 4 (four) hours as needed (shortness of breath, if you can't catch your breath). 8 g 5  . amoxicillin (AMOXIL) 500 MG tablet Take 1,000 mg by mouth See admin instructions. Dental procedure    . apixaban (ELIQUIS) 5 MG TABS tablet Take 1 tablet (5 mg total) by mouth 2 (two) times daily. 180 tablet 1  . atorvastatin (LIPITOR) 40 MG tablet TAKE 1 TABLET BY MOUTH  DAILY AT 6 PM 90 tablet 3  . budesonide (PULMICORT) 0.25 MG/2ML nebulizer solution One twice daily 120 mL 12  . CALCIUM PO Take 1 tablet by mouth daily.    . Cholecalciferol (VITAMIN D3) 2000 UNITS TABS Take 2,000  Int'l Units by mouth daily.    . cyclobenzaprine (FLEXERIL) 5 MG tablet TAKE 1 TABLET BY MOUTH AT  BEDTIME AS NEEDED FOR  MUSCLE SPASM(S) (Patient taking differently: TAKE 1 TABLET BY MOUTH AT  BEDTIME AS NEEDED FOR  MUSCLE SPASM(S)) 90 tablet 0  . diltiazem (CARDIZEM CD) 240 MG 24 hr capsule Take 1 capsule (240 mg total) by mouth daily. 90 capsule 1  . dofetilide (TIKOSYN) 250 MCG capsule Take 1 capsule (250 mcg total) by mouth 2 (two) times daily. 60 capsule 6  . DULoxetine (CYMBALTA) 30 MG capsule Take 1 capsule (30 mg total) by mouth daily. 90 capsule 1  . formoterol (PERFOROMIST) 20 MCG/2ML nebulizer solution Take 2 mLs (20 mcg total) by nebulization 2 (two) times daily. Use in nebulizer twice daily perfectly regularly 120 mL 11  . golimumab (SIMPONI ARIA) 50 MG/4ML SOLN injection Inject 50 mg into the vein every 8 (eight) weeks.     Marland Kitchen HYDROcodone-acetaminophen (NORCO) 10-325 MG tablet Take 1 tablet by mouth 3 (three) times daily as needed.    . hydrocortisone 2.5 % cream Apply 1 application topically 2 (two) times daily as needed (itchiness).    Marland Kitchen ketoconazole (NIZORAL) 2 % cream Apply 1 application topically daily as needed for irritation.    Marland Kitchen leflunomide (ARAVA) 20 MG tablet Take 1 tablet (20 mg total) by mouth daily.    Marland Kitchen levothyroxine (SYNTHROID) 125 MCG tablet TAKE 1 TABLET BY MOUTH EVERY DAY 90 tablet 1  . losartan (COZAAR) 50 MG tablet Take 1 tablet (50 mg total) by mouth daily. 90 tablet 3  . montelukast (SINGULAIR) 10 MG tablet Take 1 tablet by mouth daily.    Marland Kitchen nystatin (MYCOSTATIN/NYSTOP) powder APPLY 1 APPLICATION        TOPICALLY 3 TIMES A DAY 15 g 0  .  OXYGEN Inhale 2 L into the lungs continuous. continuous o2    . pantoprazole (PROTONIX) 40 MG tablet Take 1 tablet (40 mg total) by mouth daily before breakfast. 90 tablet 3  . pregabalin (LYRICA) 50 MG capsule Take 50 mg by mouth at bedtime.    . Tiotropium Bromide Monohydrate (SPIRIVA RESPIMAT) 2.5 MCG/ACT AERS Inhale 2 puffs  into the lungs daily. 12 g 3  . torsemide (DEMADEX) 20 MG tablet Take 1 tablet (20 mg total) by mouth daily. 90 tablet 3  . traMADol (ULTRAM) 50 MG tablet Take 1 tablet (50 mg total) by mouth 2 (two) times daily. 60 tablet 1  . triamcinolone cream (KENALOG) 0.1 % Apply 1 application topically 2 (two) times daily as needed (for psoriasis).     Marland Kitchen umeclidinium bromide (INCRUSE ELLIPTA) 62.5 MCG/INH AEPB Inhale 1 puff into the lungs daily. 3 each 2  . vitamin B-12 (CYANOCOBALAMIN) 1000 MCG tablet Take 1,000 mcg by mouth daily.    . ferrous sulfate 325 (65 FE) MG tablet Take 1 tablet (325 mg total) by mouth daily with breakfast. 30 tablet 0   No current facility-administered medications for this encounter.    Allergies  Allergen Reactions  . Gabapentin Other (See Comments)    Dizziness, lighthead    Social History   Socioeconomic History  . Marital status: Widowed    Spouse name: Not on file  . Number of children: Not on file  . Years of education: Not on file  . Highest education level: Not on file  Occupational History  . Occupation: retired    Associate Professor: Designer, jewellery  Tobacco Use  . Smoking status: Former Smoker    Packs/day: 1.50    Years: 58.50    Pack years: 87.75    Types: Cigarettes    Quit date: 03/03/2005    Years since quitting: 16.0  . Smokeless tobacco: Never Used  Vaping Use  . Vaping Use: Never used  Substance and Sexual Activity  . Alcohol use: No  . Drug use: No  . Sexual activity: Not on file  Other Topics Concern  . Not on file  Social History Narrative  . Not on file   Social Determinants of Health   Financial Resource Strain: Low Risk   . Difficulty of Paying Living Expenses: Not hard at all  Food Insecurity: Not on file  Transportation Needs: No Transportation Needs  . Lack of Transportation (Medical): No  . Lack of Transportation (Non-Medical): No  Physical Activity: Not on file  Stress: Not on file  Social Connections: Not on file  Intimate  Partner Violence: Not on file     ROS- All systems are reviewed and negative except as per the HPI above.  Physical Exam: Vitals:   03/28/21 1121  BP: 118/70  Pulse: 90  Weight: 91.6 kg  Height:  (1.499 m)    GEN- The patient is a well appearing obese elderly female, alert and oriented x 3 today.   HEENT-head normocephalic, atraumatic, sclera clear, conjunctiva pink, hearing intact, trachea midline. Lungs- Clear to ausculation bilaterally, normal work of breathing Heart- Regular rate and rhythm, no murmurs, rubs or gallops  GI- soft, NT, ND, + BS Extremities- no clubbing, cyanosis, or edema MS- no significant deformity or atrophy Skin- no rash or lesion Psych- euthymic mood, full affect Neuro- strength and sensation are intact   Wt Readings from Last 3 Encounters:  03/28/21 91.6 kg  03/18/21 90.7 kg  02/14/21 90.6 kg  EKG today demonstrates  SR, inc RBBB Vent. rate 90 BPM PR interval 184 ms QRS duration 90 ms QT/QTcB 388/474 ms  Echo 12/27/20 demonstrated  1. Left ventricular ejection fraction, by estimation, is 60 to 65%. The  left ventricle has normal function. The left ventricle has no regional  wall motion abnormalities. There is severe left ventricular hypertrophy.  Left ventricular diastolic parameters  are indeterminate.  2. Right ventricular systolic function is normal. The right ventricular  size is normal.  3. Left atrial size was moderately dilated.  4. The mitral valve is normal in structure. No evidence of mitral valve  regurgitation. No evidence of mitral stenosis. Moderate mitral annular  calcification.  5. The aortic valve is normal in structure. Aortic valve regurgitation is  not visualized. No aortic stenosis is present.  6. The inferior vena cava is dilated in size with <50% respiratory  variability, suggesting right atrial pressure of 15 mmHg.  Epic records are reviewed at length today  CHA2DS2-VASc Score = 7  The patient's  score is based upon: CHF History: Yes HTN History: Yes Diabetes History: No Stroke History: Yes Vascular Disease History: Yes Age Score: 1 Gender Score: 1      ASSESSMENT AND PLAN: 1. Persistent atrial fibrillation/Atrial flutter The patient's CHA2DS2-VASc score is 7, indicating a 11.2% annual risk of stroke.   S/p dofetilide admission 5/16-5/19/22 Patient appears to be maintaining SR.  Continue dofetilide 250 mcg BID, QT stable. Check bmet/mag today. Continue Eliquis 5 mg BID Continue diltiazem 240 mg daily  2. Secondary Hypercoagulable State (ICD10:  D68.69) The patient is at significant risk for stroke/thromboembolism based upon her CHA2DS2-VASc Score of 7.  Continue Apixaban (Eliquis).   3. Obesity Body mass index is 40.8 kg/m. Lifestyle modification was discussed and encouraged including regular physical activity and weight reduction.  4. CAD S/p CABG No anginal symptoms.  5. HTN Stable, no changes today.  6. Chronic diastolic CHF No signs or symptoms of fluid overload.   Follow up with Dr Ladona Ridgel as scheduled.    Jorja Loa PA-C [facility] [address] [place], [place] [address] [phone] 03/28/2021 11:30 AM

## 2021-03-28 ENCOUNTER — Encounter (HOSPITAL_COMMUNITY): Payer: Self-pay | Admitting: Physician Assistant

## 2021-03-28 ENCOUNTER — Other Ambulatory Visit: Payer: Self-pay

## 2021-03-28 ENCOUNTER — Ambulatory Visit (INDEPENDENT_AMBULATORY_CARE_PROVIDER_SITE_OTHER): Payer: Medicare Other | Admitting: Pharmacist

## 2021-03-28 ENCOUNTER — Ambulatory Visit (HOSPITAL_COMMUNITY)
Admission: RE | Admit: 2021-03-28 | Discharge: 2021-03-28 | Disposition: A | Discharge status: Home / Self Care | Payer: Medicare Other | Source: Ambulatory Visit | Attending: Physician Assistant | Admitting: Physician Assistant

## 2021-03-28 VITALS — BP 118/70 | HR 90 | Ht 59.0 in | Wt 202.0 lb

## 2021-03-28 DIAGNOSIS — I10 Essential (primary) hypertension: Secondary | ICD-10-CM | POA: Diagnosis not present

## 2021-03-28 DIAGNOSIS — E669 Obesity, unspecified: Secondary | ICD-10-CM | POA: Insufficient documentation

## 2021-03-28 DIAGNOSIS — E782 Mixed hyperlipidemia: Secondary | ICD-10-CM

## 2021-03-28 DIAGNOSIS — I2510 Atherosclerotic heart disease of native coronary artery without angina pectoris: Secondary | ICD-10-CM | POA: Diagnosis not present

## 2021-03-28 DIAGNOSIS — I110 Hypertensive heart disease with heart failure: Secondary | ICD-10-CM | POA: Diagnosis not present

## 2021-03-28 DIAGNOSIS — I4819 Other persistent atrial fibrillation: Secondary | ICD-10-CM

## 2021-03-28 DIAGNOSIS — Z6841 Body Mass Index (BMI) 40.0 and over, adult: Secondary | ICD-10-CM | POA: Insufficient documentation

## 2021-03-28 DIAGNOSIS — D6869 Other thrombophilia: Secondary | ICD-10-CM | POA: Diagnosis not present

## 2021-03-28 DIAGNOSIS — I5032 Chronic diastolic (congestive) heart failure: Secondary | ICD-10-CM | POA: Insufficient documentation

## 2021-03-28 DIAGNOSIS — K219 Gastro-esophageal reflux disease without esophagitis: Secondary | ICD-10-CM

## 2021-03-28 DIAGNOSIS — Z7901 Long term (current) use of anticoagulants: Secondary | ICD-10-CM | POA: Diagnosis not present

## 2021-03-28 DIAGNOSIS — Z951 Presence of aortocoronary bypass graft: Secondary | ICD-10-CM | POA: Insufficient documentation

## 2021-03-28 DIAGNOSIS — I4892 Unspecified atrial flutter: Secondary | ICD-10-CM | POA: Diagnosis not present

## 2021-03-28 DIAGNOSIS — Z87891 Personal history of nicotine dependence: Secondary | ICD-10-CM | POA: Insufficient documentation

## 2021-03-28 LAB — MAGNESIUM: Magnesium: 1.8 mg/dL (ref 1.7–2.4)

## 2021-03-28 LAB — BASIC METABOLIC PANEL
Anion gap: 9 (ref 5–15)
BUN: 23 mg/dL (ref 8–23)
CO2: 32 mmol/L (ref 22–32)
Calcium: 8.9 mg/dL (ref 8.9–10.3)
Chloride: 95 mmol/L — ABNORMAL LOW (ref 98–111)
Creatinine, Ser: 1.24 mg/dL — ABNORMAL HIGH (ref 0.44–1.00)
GFR, Estimated: 46 mL/min — ABNORMAL LOW (ref >60)
Glucose, Bld: 127 mg/dL — ABNORMAL HIGH (ref 70–99)
Potassium: 4.1 mmol/L (ref 3.5–5.1)
Sodium: 136 mmol/L (ref 135–145)

## 2021-03-28 MED ORDER — DOFETILIDE 250 MCG PO CAPS: 250.0000 ug | ORAL_CAPSULE | Freq: Two times a day (BID) | ORAL | 1 refills | Status: DC

## 2021-03-28 NOTE — Progress Notes (Signed)
Chronic Care Management Pharmacy Note  04/02/2021 Name:  [name] MRN:  [id number] DOB:  05-24-1946  Subjective: [name] is an 75 y.o. year old female who is a primary patient of Isaac Bliss, Rayford Halsted, MD.  The CCM team was consulted for assistance with disease management and care coordination needs.    Engaged with patient by telephone for follow up visit in response to provider referral for pharmacy case management and/or care coordination services.   Consent to Services:  The patient was given information about Chronic Care Management services, agreed to services, and gave verbal consent prior to initiation of services.  Please see initial visit note for detailed documentation.   Patient Care Team: Isaac Bliss, Rayford Halsted, MD as PCP - General (Internal Medicine) Dorothy Spark, MD (Inactive) as PCP - Cardiology (Cardiology) Viona Gilmore, [facility] as Pharmacist (Pharmacist)  Recent office visits: 03/13/21 Telephone encounter: Patient's daughter requested a dose increase for Cymbalta. Increased to 30 mg daily.  02/19/21 Domingo Mend, MD: Patient presented for post cardioversion and medication management. Switched Celexa to Cymbalta due to QT prolongation risk.  01/11/21 Domingo Mend, MD: Patient presented for hospital follow up.  11/27/20 Domingo Mend, MD: Patient presented for video visit for COVID infection. Placed referral for remdesevir.   11/22/20 Domingo Mend, MD: Patient presented for annual exam and COPD exacerbation. Plan for IV Rocephin in office, prescribed prednisone taper and Z-pak.   Recent consult visits: 03/01/21 Christinia Gully, MD (pulmonary): Patient presented for COPD follow up.  02/12/21 Cardiology telephone encounter: Patient switched from Celexa to Cymbalta due to QTc prolonging risk.  02/05/21 Clint Fenton, PA (Afib clinic): Patient presented for A flutter follow up. Increased toresmide to 82m a day for 3 days then  reduce back to 2101ma day. Scheduled cardioversion April 12th.  01/03/21 Clint Fenton, PA (Afib clinic): Patient presented for A flutter follow up. Continued diltiazem 240 mg daily and Eliquis.  01/01/21 MiChristinia GullyMD (pulmonary): Patient presented for COPD follow up. Referral placed for sleep study.   12/25/20 Clint Fenton, PA (Afib clinic): Patient presented for initial A flutter evaluation. Prescribed Eliquis 5 mg BID and increased diltiazem to 240 mg in AM and 120 mg in PM.  11/30/20 MaRudean HaskellMD (cardiology): Patient presented for elevated troponin. Plan for at home monitor. Increased diltiazem to 240 mg daily.  Hospital visits: 03/18/21 Patient admitted for Tikosyn start.  02/19/21 Patient admitted for cardioversion.  2/24-2/28/22 Patient admitted for pulmonary edema. D/c'd furosemide and started on torsemide.   Objective:  Lab Results  Component Value Date   CREATININE 1.24 (H) 03/28/2021   BUN 23 03/28/2021   GFR 38.25 (L) 11/22/2020   GFRNONAA 46 (L) 03/28/2021   GFRAA 56 (L) 06/08/2020   NA 136 03/28/2021   K 4.1 03/28/2021   CALCIUM 8.9 03/28/2021   CO2 32 03/28/2021   GLUCOSE 127 (H) 03/28/2021    Lab Results  Component Value Date/Time   HGBA1C 5.3 08/25/2018 08:19 PM   HGBA1C 5.8 (H) 05/31/2015 09:29 PM   GFR 38.25 (L) 11/22/2020 12:18 PM   GFR 45.51 (L) 11/24/2018 01:59 PM    Last diabetic Eye exam: No results found for: HMDIABEYEEXA  Last diabetic Foot exam: No results found for: HMDIABFOOTEX   Lab Results  Component Value Date   CHOL 164 01/18/2020   HDL 56 01/18/2020   LDLCALC 87 01/18/2020   LDLDIRECT 131.1 08/10/2012   TRIG 117 01/18/2020   CHOLHDL 2.9 01/18/2020  Hepatic Function Latest Ref Rng & Units 12/30/2020 12/29/2020 12/27/2020  Total Protein 6.5 - 8.1 g/dL - - 6.2(L)  Albumin 3.5 - 5.0 g/dL 3.4(L) 3.1(L) 3.3(L)  AST 15 - 41 U/L - - 25  ALT 0 - 44 U/L - - 24  Alk Phosphatase 38 - 126 U/L - - 55  Total Bilirubin 0.3 - 1.2  mg/dL - - 0.4  Bilirubin, Direct 0.00 - 0.40 mg/dL - - -    Lab Results  Component Value Date/Time   TSH 0.752 12/28/2020 03:27 AM   TSH 1.77 11/22/2020 12:18 PM   TSH 0.61 04/23/2020 02:23 PM   FREET4 1.07 01/18/2020 11:10 AM   FREET4 1.06 03/11/2019 07:37 AM    CBC Latest Ref Rng & Units 02/05/2021 01/07/2021 12/31/2020  WBC 4.0 - 10.5 K/uL 10.8(H) 12.5(H) 9.5  Hemoglobin 12.0 - 15.0 g/dL 11.3(L) 10.4(L) 9.7(L)  Hematocrit 36.0 - 46.0 % 39.0 32.3(L) 31.3(L)  Platelets 150 - 400 K/uL 242 170 182    Lab Results  Component Value Date/Time   VD25OH 49.11 02/19/2021 04:19 PM    Clinical ASCVD: Yes  The ASCVD Risk score Mikey Bussing DC Jr., et al., 2013) failed to calculate for the following reasons:   The patient has a prior MI or stroke diagnosis    Depression screen [facility] 2/9 02/19/2021 11/29/2020 03/23/2020  Decreased Interest 0 0 0  Down, Depressed, Hopeless 0 1 0  PHQ - 2 Score 0 1 0  Altered sleeping 0 0 -  Tired, decreased energy 2 3 -  Change in appetite 0 1 -  Feeling bad or failure about yourself  0 0 -  Trouble concentrating 0 0 -  Moving slowly or fidgety/restless 0 0 -  Suicidal thoughts 0 0 -  PHQ-9 Score 2 5 -  Difficult doing work/chores Not difficult at all - -  Some recent data might be hidden     CHA2DS2/VAS Stroke Risk Points  Current as of 12 minutes ago     5 >= 2 Points: High Risk  1 - 1.99 Points: Medium Risk  0 Points: Low Risk    No Change      Details    This score determines the patient's risk of having a stroke if the  patient has atrial fibrillation.       Points Metrics  1 Has Congestive Heart Failure:  Yes    Current as of 12 minutes ago  1 Has Vascular Disease:  Yes    Current as of 12 minutes ago  1 Has Hypertension:  Yes    Current as of 12 minutes ago  1 Age:  33    Current as of 12 minutes ago  0 Has Diabetes:  No    Current as of 12 minutes ago  0 Had Stroke:  No  Had TIA:  No  Had Thromboembolism:  No    Current as of 12 minutes  ago  1 Female:  Yes    Current as of 12 minutes ago       Social History   Tobacco Use  Smoking Status Former Smoker  . Packs/day: 1.50  . Years: 58.50  . Pack years: 87.75  . Types: Cigarettes  . Quit date: 03/03/2005  . Years since quitting: 16.0  Smokeless Tobacco Never Used   BP Readings from Last 3 Encounters:  03/28/21 118/70  03/21/21 (!) 131/57  03/18/21 (!) 160/76   Pulse Readings from Last 3 Encounters:  03/28/21 90  03/21/21 84  03/18/21 87   Wt Readings from Last 3 Encounters:  03/28/21 202 lb (91.6 kg)  03/18/21 200 lb (90.7 kg)  02/14/21 199 lb 12.8 oz (90.6 kg)   BMI Readings from Last 3 Encounters:  03/28/21 40.80 kg/m  03/18/21 40.40 kg/m  03/18/21 40.40 kg/m    Assessment/Interventions: Review of patient past medical history, allergies, medications, health status, including review of consultants reports, laboratory and other test data, was performed as part of comprehensive evaluation and provision of chronic care management services.   SDOH:  (Social Determinants of Health) assessments and interventions performed: Yes SDOH Interventions   Flowsheet Row Most Recent Value  SDOH Interventions   Financial Strain Interventions Other (Comment)  [working on patient assistance]     SDOH Screenings   Alcohol Screen: Not on file  Depression (PHQ2-9): Low Risk   . PHQ-2 Score: 2  Financial Resource Strain: Medium Risk  . Difficulty of Paying Living Expenses: Somewhat hard  Food Insecurity: Not on file  Housing: Not on file  Physical Activity: Not on file  Social Connections: Not on file  Stress: Not on file  Tobacco Use: Medium Risk  . Smoking Tobacco Use: Former Smoker  . Smokeless Tobacco Use: Never Used  Transportation Needs: No Transportation Needs  . Lack of Transportation (Medical): No  . Lack of Transportation (Non-Medical): No   Feeling good since starting the Tikosyn - has been staying in normal   Monitor heart rate - hasnt felt  different any time   CCM Care Plan  Allergies  Allergen Reactions  . Gabapentin Other (See Comments)    Dizziness, lighthead    Medications Reviewed Today    Reviewed by Hinda Kehr, CMA (Certified Medical Assistant) on 03/28/21 at 1114  Med List Status: <None>  Medication Order Taking? Sig Documenting Provider Last Dose Status Informant  acetaminophen (TYLENOL) 650 MG CR tablet [id number] Yes Take 650 mg by mouth every 8 (eight) hours. [provider] Taking Active Self  albuterol (VENTOLIN HFA) 108 (90 Base) MCG/ACT inhaler [id number] Yes Inhale 2 puffs into the lungs every 4 (four) hours as needed (shortness of breath, if you can't catch your breath). Tanda Rockers, MD Taking Active Self  amoxicillin (AMOXIL) 500 MG tablet [id number] Yes Take 1,000 mg by mouth See admin instructions. Dental procedure [provider] Taking Active Self  apixaban (ELIQUIS) 5 MG TABS tablet [id number] Yes Take 1 tablet (5 mg total) by mouth 2 (two) times daily. Werner Lean, MD Taking Active Self  atorvastatin (LIPITOR) 40 MG tablet [id number] Yes TAKE 1 TABLET BY MOUTH  DAILY AT 6 PM Chandrasekhar, Mahesh A, MD Taking Active Self  budesonide (PULMICORT) 0.25 MG/2ML nebulizer solution [id number] Yes One twice daily Tanda Rockers, MD Taking Active   CALCIUM PO [id number] Yes Take 1 tablet by mouth daily. [provider] Taking Active Self  Cholecalciferol (VITAMIN D3) 2000 UNITS TABS [id number] Yes Take 2,000 Int'l Units by mouth daily. [provider] Taking Active Self  cyclobenzaprine (FLEXERIL) 5 MG tablet [id number] Yes TAKE 1 TABLET BY MOUTH AT  BEDTIME AS NEEDED FOR  MUSCLE SPASM(S)  Patient taking differently: TAKE 1 TABLET BY MOUTH AT  BEDTIME AS NEEDED FOR  MUSCLE SPASM(S)   Isaac Bliss, Rayford Halsted, MD Taking Active   diltiazem (CARDIZEM CD) 240 MG 24 hr capsule [id number] Yes Take 1 capsule (240 mg total) by mouth daily. Werner Lean, MD  Taking Active Self  dofetilide (TIKOSYN) 250 MCG  capsule [id number] Yes Take 1 capsule (250 mcg total) by mouth 2 (two) times daily. Shirley Friar, PA-C Taking Active   DULoxetine (CYMBALTA) 30 MG capsule [id number] Yes Take 1 capsule (30 mg total) by mouth daily. Isaac Bliss, Rayford Halsted, MD Taking Active Self  ferrous sulfate 325 (65 FE) MG tablet [id number]  Take 1 tablet (325 mg total) by mouth daily with breakfast. Rudean Haskell A, MD  Expired 02/09/21 2359 Self  formoterol (PERFOROMIST) 20 MCG/2ML nebulizer solution [id number] Yes Take 2 mLs (20 mcg total) by nebulization 2 (two) times daily. Use in nebulizer twice daily perfectly regularly Tanda Rockers, MD Taking Active Self  golimumab (SIMPONI ARIA) 50 MG/4ML SOLN injection [id number] Yes Inject 50 mg into the vein every 8 (eight) weeks.  [provider] Taking Active Self  HYDROcodone-acetaminophen (NORCO) 10-325 MG tablet [id number] Yes Take 1 tablet by mouth 3 (three) times daily as needed. [provider] Taking Active   hydrocortisone 2.5 % cream [id number] Yes Apply 1 application topically 2 (two) times daily as needed (itchiness). [provider] Taking Active Self  ketoconazole (NIZORAL) 2 % cream [id number] Yes Apply 1 application topically daily as needed for irritation. [provider] Taking Active Self  leflunomide (ARAVA) 20 MG tablet [id number] Yes Take 1 tablet (20 mg total) by mouth daily. Isaac Bliss, Rayford Halsted, MD Taking Active Self  levothyroxine (SYNTHROID) 125 MCG tablet [id number] Yes TAKE 1 TABLET BY MOUTH EVERY DAY Isaac Bliss, Rayford Halsted, MD Taking Active Self  losartan (COZAAR) 50 MG tablet [id number] Yes Take 1 tablet (50 mg total) by mouth daily. Werner Lean, MD Taking Active Self  montelukast (SINGULAIR) 10 MG tablet [id number] Yes Take 1 tablet by mouth daily. [provider] Taking Active   nystatin (MYCOSTATIN/NYSTOP) powder [id number] Yes  APPLY 1 APPLICATION        TOPICALLY 3 TIMES A DAY Isaac Bliss, Rayford Halsted, MD Taking Active Self  OXYGEN [id number] Yes Inhale 2 L into the lungs continuous. continuous o2 [provider] Taking Active Self  pantoprazole (PROTONIX) 40 MG tablet [id number] Yes Take 1 tablet (40 mg total) by mouth daily before breakfast. Tanda Rockers, MD Taking Active Self  pregabalin (LYRICA) 50 MG capsule [id number] Yes Take 50 mg by mouth at bedtime. [provider] Taking Active Self  Tiotropium Bromide Monohydrate (SPIRIVA RESPIMAT) 2.5 MCG/ACT AERS [id number] Yes Inhale 2 puffs into the lungs daily. Tanda Rockers, MD Taking Active Self  torsemide (DEMADEX) 20 MG tablet [id number] Yes Take 1 tablet (20 mg total) by mouth daily. Werner Lean, MD Taking Active Self  traMADol (ULTRAM) 50 MG tablet [id number] Yes Take 1 tablet (50 mg total) by mouth 2 (two) times daily. Dutch Quint B, FNP Taking Active   triamcinolone cream (KENALOG) 0.1 % [id number] Yes Apply 1 application topically 2 (two) times daily as needed (for psoriasis).  [provider] Taking Active Self  umeclidinium bromide (INCRUSE ELLIPTA) 62.5 MCG/INH AEPB [id number] Yes Inhale 1 puff into the lungs daily. Tanda Rockers, MD Taking Active Self           Med Note Bonnye Fava Mar 18, 2021  4:04 PM) Hasn't started.  vitamin B-12 (CYANOCOBALAMIN) 1000 MCG tablet [id number] Yes Take 1,000 mcg by mouth daily. [provider] Taking Active Self          Patient Active Problem List   Diagnosis Date Noted  . Persistent atrial fibrillation ([place]) 02/05/2021  .  Chronic heart failure with preserved ejection fraction ([place]) 01/07/2021  . Stage 3b chronic kidney disease ([place]) 01/07/2021  . Paroxysmal atrial fibrillation ([place]) 01/03/2021  . History of COVID-19 12/27/2020  . Sciatica 12/27/2020  . Diarrhea 12/27/2020  . Atrial flutter ([place]) 12/25/2020  . Secondary hypercoagulable state ([place])  12/25/2020  . Chronic respiratory failure with hypoxia and hypercapnia ([place]) 04/20/2019  . Severe episode of recurrent major depressive disorder, without psychotic features ([place]) 11/09/2018  . COPD (chronic obstructive pulmonary disease) ([place]) 11/02/2018  . COPD with acute exacerbation ([place]) 11/01/2018  . CAP (community acquired pneumonia) 10/28/2018  . ICH (intracerebral hemorrhage) ([place]) 08/25/2018  . Hyperlipidemia 07/12/2015  . 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 Bradycardia 06/01/2015  . Morbid (severe) obesity due to excess calories ([place]) 04/22/2015  . Postoperative anemia due to acute blood loss 06/08/2013  . History of home oxygen therapy 06/07/2013  . GERD (gastroesophageal reflux disease) 06/07/2013  . Barrett's esophagus 06/07/2013  . OA (osteoarthritis) of knee 06/06/2013  . COPD III spirometry if use FEV1/VC p saba  07/18/2010  . 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 Compression fracture of thoracic vertebra ([place]) 07/02/2010  . Hypothyroidism 04/20/2007  . Essential hypertension 04/20/2007  . PSORIATIC ARTHRITIS 04/20/2007    Immunization History  Administered Date(s) Administered  . Influenza Split 08/04/2011, 09/01/2012, 07/05/2015  . Influenza Whole 09/21/2007, 07/23/2010, 10/03/2016  . Influenza, High Dose Seasonal PF 07/27/2015, 12/24/2017, 08/09/2018, 08/29/2019  . Influenza-Unspecified 10/03/2013, 09/03/2014  . PFIZER(Purple Top)SARS-COV-2 Vaccination 09/03/2020, 10/03/2020  . Pneumococcal Conjugate-13 04/12/2014  . Pneumococcal Polysaccharide-23 07/27/2015  . Pneumococcal-Unspecified 11/03/2004  . Tdap 08/12/2011    Conditions to be addressed/monitored:  Hypertension, Hyperlipidemia, Heart Failure, GERD, COPD, Hypothyroidism, Depression and Pain, Arthritis  Care Plan : CCM Pharmacy Care Plan  Updates made by Viona Gilmore, [place] since 04/02/2021 12:00 AM    Problem: Problem: Hypertension, Hyperlipidemia, Heart Failure, GERD, COPD,  Hypothyroidism, Depression and Pain, Arthritis     Long-Range Goal: Patient-Specific Goal   Start Date: 03/28/2021  Expected End Date: 03/28/2022  This Visit's Progress: On track  Priority: High  Note:   Current Barriers:  . Unable to independently monitor therapeutic efficacy  Pharmacist Clinical Goal(s):  Marland Kitchen Patient will verbalize ability to afford treatment regimen . achieve adherence to monitoring guidelines and medication adherence to achieve therapeutic efficacy through collaboration with PharmD and provider.   Interventions: . 1:1 collaboration with Isaac Bliss, Rayford Halsted, MD regarding development and update of comprehensive plan of care as evidenced by provider attestation and co-signature . Inter-disciplinary care team collaboration (see longitudinal plan of care) . Comprehensive medication review performed; medication list updated in electronic medical record  Hypertension (BP goal <140/90) -Uncontrolled -Current treatment:  Losartan 51m, 1 tablet once daily   Diltiazem (Cardizem CD) 127m 1 capsule at bedtime  -Medications previously tried: amlodipine -Current home readings: 117/80 (once a day); pretty normal for her (120/87) -Current dietary habits: did not discuss -Current exercise habits: limited -Denies hypotensive/hypertensive symptoms -Educated on Exercise goal of 150 minutes per week; Importance of home blood pressure monitoring; Proper BP monitoring technique; -Counseled to monitor BP at home weekly, document, and provide log at future appointments -Counseled on diet and exercise extensively Recommended to continue current medication  Hyperlipidemia: (LDL goal < 70) -Uncontrolled -Current treatment: . Atorvastatin 4052m1 tablet once daily at 6 pm -Medications previously tried: none  -Current dietary patterns: did not discuss -Current exercise habits: did not discuss -Educated on Cholesterol goals;  Benefits of statin for ASCVD risk  reduction; Importance of limiting foods high in cholesterol; Exercise goal of 150 minutes per week; -Counseled on diet and exercise extensively Recommended to continue current medication Recommended repeat lipid panel and increasing statin dose if LDL not < 70  CAD (Goal: prevent heart events) -Controlled -Current treatment   Atorvastatin 80m, 1 tablet once daily -Medications previously tried: aspirin -Recommended to continue current medication  Atrial Fibrillation (Goal: prevent stroke and major bleeding) -Controlled -CHADSVASC: 5 -Current treatment: . Rhythm control: Dofetilide 250 mcg 1 capsule twice daily . Anticoagulation: Eliquis 5 mg 1 tablet twice daily -Medications previously tried: none -Home BP and HR readings: refer to above readings  -Counseled on increased risk of stroke due to Afib and benefits of anticoagulation for stroke prevention; importance of adherence to anticoagulant exactly as prescribed; avoidance of NSAIDs due to increased bleeding risk with anticoagulants; -Recommended to continue current medication Assessed patient finances. Patient would likely qualify for PAP. Plan for PAP assessment with CPA.   Heart Failure (Goal: manage symptoms and prevent exacerbations) -Controlled -Last ejection fraction: 60-65% (Date: 12/27/2020) -HF type: Diastolic -NYHA Class: II (slight limitation of activity) -AHA HF Stage: C (Heart disease and symptoms present) -Current treatment:  Torsemide 21m 1 tablet daily  Losartan 5011m tablet once daily  -Medications previously tried: furosemide  -Current home BP/HR readings: refer to above readings -Current dietary habits: did not discuss -Current exercise habits: did not discuss -Educated on Importance of weighing daily; if you gain more than 3 pounds in one day or 5 pounds in one week, call cardiologist Proper diuretic administration and potassium supplementation Importance of blood pressure control -Recommended  to continue current medication  COPD (Goal: control symptoms and prevent exacerbations) -Controlled -Current treatment   Albuterol (Ventolin HFA) 108m29mact inhaler, inhale 2 puffs every four hours as needed for shortness of breath  Montelukast 10mg69mtablet at bedtime   Budesonide 0.25 mg/2mL, 21male twice daily  Formoterol 20 mcg/mL, inhale 2 mLs twice daily   Incruse Ellipta 62.5 mcg 1 puff daily -Medications previously tried: Trelegy -Gold Grade: Gold 3 (FEV1 30-49%) -Current COPD Classification:  B (high sx, <2 exacerbations/yr) -MMRC/CAT score: n/a -Pulmonary function testing: n/a -Exacerbations requiring treatment in last 6 months: none -Patient reports consistent use of maintenance inhaler -Frequency of rescue inhaler use: not often -Counseled on Proper inhaler technique; Benefits of consistent maintenance inhaler use When to use rescue inhaler Differences between maintenance and rescue inhalers -Recommended to continue current medication Assessed patient finances. Patient would likely qualify for PAP for Incruse Ellipa. Plan for [place]MA [place].  Depression(Goal: minimize symptoms) -Controlled -Current treatment: . Duloxetine 40 mg, 1 tablet in the morning  -Medications previously tried/failed: citalopram (Qtc prolongation risk) -PHQ9: 2 -Educated on Benefits of medication for symptom control Benefits of cognitive-behavioral therapy with or without medication -Recommended to continue current medication  Psoriatic arthritis (Goal: minimize symptoms) -Controlled -Current treatment   golimumab (Simponi Aria), inject 50mg e13m eight weeks   leflunomide (Arava) 20mg, 197mlet once daily  -Medications previously tried: none  -Recommended to continue current medication  Hypothyroidism (Goal: 0.35-4.5) -Controlled -Current treatment  . Levothyroxine 125mcg, 133mlet once daily  -Medications previously tried: none  -Recommended to continue current  medication  GERD/Barrett's esophagus (Goal: minimize symptoms) -Controlled -Current treatment   Pantoprazole 40mg, 1 t67mt once daily before breakfast  -Medications previously tried: omeprazole  -Recommended to continue current medication  Pain (Goal: minimize symptoms of pain) -Controlled -Current treatment  Tramadol 52m, 1 tablet twice daily   APAP 655m 1,3006mwice daily   Cyclobenzaprine 5mg60m tablet at bedtime as needed for muscle spasm -Medications previously tried: n/a  -Recommended to continue current medication  Osteopenia (Goal prevent fractures) -Controlled -Last DEXA Scan: 08/17/2018             T-Score femoral neck: -1.9             T-Score lumbar spine: 0.8             T-Score femur: -1.7             10-year probability of major osteoporotic fracture: 10.3%             10-year probability of hip fracture: 1.9% -Patient is not a candidate for pharmacologic treatment -Current treatment   Calcium, 1 tablet once daily   Vitamin D3 (cholecalciferol) (25mc82m000 units, 1 tablet once daily -Medications previously tried: none  -Recommend [phone] units of vitamin D daily. Recommend 1200 mg of calcium daily from dietary and supplemental sources. Recommend weight-bearing and muscle strengthening exercises for building and maintaining bone density. -Counseled on diet and exercise extensively Recommended to continue current medication Recommended repeat DEXA scan   Health Maintenance -Vaccine gaps: shingles -Current therapy:   Triamcinolone cream 1%, apply twice daily as needed for psoriasis -Educated on Cost vs benefit of each product must be carefully weighed by individual consumer -Patient is satisfied with current therapy and denies issues -Recommended to continue current medication  Patient Goals/Self-Care Activities . Patient will:  - take medications as prescribed check blood pressure weekly, document, and provide at future appointments target  a minimum of 150 minutes of moderate intensity exercise weekly  Follow Up Plan: Telephone follow up appointment with care management team member scheduled for: 4 months         Medication Assistance: Application for Incruse Ellipta and Eliquis   medication assistance program. in process.  Anticipated assistance start date 05/02/21.  See plan of care for additional detail.  Compliance/Adherence/Medication fill history: Care Gaps: None  Star-Rating Drugs: Atorvastatin 40 mg - [facility]14[facility] Losartan 50 mg - last filled [facility]/31/21 [facility]  [facility]  [id number]: [phone]/pharmacy #[id number] [address] [place] - [address] [address]: [phone] [address]: [phone] [facility] [address] [address]: [phone] pill box? Yes Pt endorses 99% compliance  We discussed: Current pharmacy is preferred with insurance plan and patient is satisfied with pharmacy services Patient decided to: Continue current medication management strategy  Care Plan and Follow Up Patient Decision:  Patient agrees to Care Plan and Follow-up.  Plan: Telephone follow up appointment with care management team member scheduled for:  4 months  MadeliJeni SallesmD BCACP Orinacist LeBaueChase [id number] [phone]

## 2021-03-29 DIAGNOSIS — M4804 Spinal stenosis, thoracic region: Secondary | ICD-10-CM | POA: Diagnosis not present

## 2021-03-29 DIAGNOSIS — J441 Chronic obstructive pulmonary disease with (acute) exacerbation: Secondary | ICD-10-CM | POA: Diagnosis not present

## 2021-03-29 DIAGNOSIS — I5033 Acute on chronic diastolic (congestive) heart failure: Secondary | ICD-10-CM | POA: Diagnosis not present

## 2021-03-29 DIAGNOSIS — M5431 Sciatica, right side: Secondary | ICD-10-CM | POA: Diagnosis not present

## 2021-03-29 DIAGNOSIS — I130 Hypertensive heart and chronic kidney disease with heart failure and stage 1 through stage 4 chronic kidney disease, or unspecified chronic kidney disease: Secondary | ICD-10-CM | POA: Diagnosis not present

## 2021-03-29 DIAGNOSIS — I4891 Unspecified atrial fibrillation: Secondary | ICD-10-CM | POA: Diagnosis not present

## 2021-04-02 ENCOUNTER — Telehealth: Payer: Self-pay | Admitting: Internal Medicine

## 2021-04-02 NOTE — Patient Instructions (Signed)
Visit Information  Goals Addressed   None    Patient Care Plan: CCM Pharmacy Care Plan    Problem Identified: Problem: Hypertension, Hyperlipidemia, Heart Failure, GERD, COPD, Hypothyroidism, Depression and Pain, Arthritis     Long-Range Goal: Patient-Specific Goal   Start Date: 03/28/2021  Expected End Date: 03/28/2022  This Visit's Progress: On track  Priority: High  Note:   Current Barriers:  . Unable to independently monitor therapeutic efficacy  Pharmacist Clinical Goal(s):  Marland Kitchen Patient will verbalize ability to afford treatment regimen . achieve adherence to monitoring guidelines and medication adherence to achieve therapeutic efficacy through collaboration with PharmD and provider.   Interventions: . 1:1 collaboration with [name], [name], MD regarding development and update of comprehensive plan of care as evidenced by provider attestation and co-signature . Inter-disciplinary care team collaboration (see longitudinal plan of care) . Comprehensive medication review performed; medication list updated in electronic medical record  Hypertension (BP goal <140/90) -Uncontrolled -Current treatment:  Losartan 50mg , 1 tablet once daily   Diltiazem (Cardizem CD) 120mg , 1 capsule at bedtime  -Medications previously tried: amlodipine -Current home readings: 117/80 (once a day); pretty normal for her (120/87) -Current dietary habits: did not discuss -Current exercise habits: limited -Denies hypotensive/hypertensive symptoms -Educated on Exercise goal of 150 minutes per week; Importance of home blood pressure monitoring; Proper BP monitoring technique; -Counseled to monitor BP at home weekly, document, and provide log at future appointments -Counseled on diet and exercise extensively Recommended to continue current medication  Hyperlipidemia: (LDL goal < 70) -Uncontrolled -Current treatment: . Atorvastatin 40mg , 1 tablet once daily at 6 pm -Medications previously  tried: none  -Current dietary patterns: did not discuss -Current exercise habits: did not discuss -Educated on Cholesterol goals;  Benefits of statin for ASCVD risk reduction; Importance of limiting foods high in cholesterol; Exercise goal of 150 minutes per week; -Counseled on diet and exercise extensively Recommended to continue current medication Recommended repeat lipid panel and increasing statin dose if LDL not < 70  CAD (Goal: prevent heart events) -Controlled -Current treatment   Atorvastatin 40mg , 1 tablet once daily -Medications previously tried: aspirin -Recommended to continue current medication  Atrial Fibrillation (Goal: prevent stroke and major bleeding) -Controlled -CHADSVASC: 5 -Current treatment: . Rhythm control: Dofetilide 250 mcg 1 capsule twice daily . Anticoagulation: Eliquis 5 mg 1 tablet twice daily -Medications previously tried: none -Home BP and HR readings: refer to above readings  -Counseled on increased risk of stroke due to Afib and benefits of anticoagulation for stroke prevention; importance of adherence to anticoagulant exactly as prescribed; avoidance of NSAIDs due to increased bleeding risk with anticoagulants; -Recommended to continue current medication Assessed patient finances. Patient would likely qualify for PAP. Plan for PAP assessment with CPA.   Heart Failure (Goal: manage symptoms and prevent exacerbations) -Controlled -Last ejection fraction: 60-65% (Date: 12/27/2020) -HF type: Diastolic -NYHA Class: II (slight limitation of activity) -AHA HF Stage: C (Heart disease and symptoms present) -Current treatment:  Torsemide 20mg , 1 tablet daily  Losartan 50mg ,1 tablet once daily  -Medications previously tried: furosemide  -Current home BP/HR readings: refer to above readings -Current dietary habits: did not discuss -Current exercise habits: did not discuss -Educated on Importance of weighing daily; if you gain more than 3 pounds  in one day or 5 pounds in one week, call cardiologist Proper diuretic administration and potassium supplementation Importance of blood pressure control -Recommended to continue current medication  COPD (Goal: control symptoms and prevent exacerbations) -Controlled -Current treatment  Albuterol (Ventolin HFA) 175mcg/ act inhaler, inhale 2 puffs every four hours as needed for shortness of breath  Montelukast 10mg , 1 tablet at bedtime   Budesonide 0.25 mg/33mL, inhale twice daily  Formoterol 20 mcg/mL, inhale 2 mLs twice daily   Incruse Ellipta 62.5 mcg 1 puff daily -Medications previously tried: Trelegy -Gold Grade: Gold 3 (FEV1 30-49%) -Current COPD Classification:  B (high sx, <2 exacerbations/yr) -MMRC/CAT score: n/a -Pulmonary function testing: n/a -Exacerbations requiring treatment in last 6 months: none -Patient reports consistent use of maintenance inhaler -Frequency of rescue inhaler use: not often -Counseled on Proper inhaler technique; Benefits of consistent maintenance inhaler use When to use rescue inhaler Differences between maintenance and rescue inhalers -Recommended to continue current medication Assessed patient finances. Patient would likely qualify for PAP for Incruse Ellipa. Plan for CMA outreach.  Depression(Goal: minimize symptoms) -Controlled -Current treatment: . Duloxetine 40 mg, 1 tablet in the morning  -Medications previously tried/failed: citalopram (Qtc prolongation risk) -PHQ9: 2 -Educated on Benefits of medication for symptom control Benefits of cognitive-behavioral therapy with or without medication -Recommended to continue current medication  Psoriatic arthritis (Goal: minimize symptoms) -Controlled -Current treatment   golimumab (Simponi Aria), inject 50mg  every eight weeks   leflunomide (Arava) 20mg , 1 tablet once daily  -Medications previously tried: none  -Recommended to continue current medication  Hypothyroidism (Goal:  0.35-4.5) -Controlled -Current treatment  . Levothyroxine 3m, 1 tablet once daily  -Medications previously tried: none  -Recommended to continue current medication  GERD/Barrett's esophagus (Goal: minimize symptoms) -Controlled -Current treatment   Pantoprazole 40mg , 1 tablet once daily before breakfast  -Medications previously tried: omeprazole  -Recommended to continue current medication  Pain (Goal: minimize symptoms of pain) -Controlled -Current treatment   Tramadol 50mg , 1 tablet twice daily   APAP 650mg , 1,300mg  twice daily   Cyclobenzaprine 5mg , 1 tablet at bedtime as needed for muscle spasm -Medications previously tried: n/a  -Recommended to continue current medication  Osteopenia (Goal prevent fractures) -Controlled -Last DEXA Scan: 08/17/2018             T-Score femoral neck: -1.9             T-Score lumbar spine: 0.8             T-Score femur: -1.7             10-year probability of major osteoporotic fracture: 10.3%             10-year probability of hip fracture: 1.9% -Patient is not a candidate for pharmacologic treatment -Current treatment   Calcium, 1 tablet once daily   Vitamin D3 (cholecalciferol) ( ) 2000 units, 1 tablet once daily -Medications previously tried: none  -Recommend [phone] units of vitamin D daily. Recommend 1200 mg of calcium daily from dietary and supplemental sources. Recommend weight-bearing and muscle strengthening exercises for building and maintaining bone density. -Counseled on diet and exercise extensively Recommended to continue current medication Recommended repeat DEXA scan   Health Maintenance -Vaccine gaps: shingles -Current therapy:   Triamcinolone cream 1%, apply twice daily as needed for psoriasis -Educated on Cost vs benefit of each product must be carefully weighed by individual consumer -Patient is satisfied with current therapy and denies issues -Recommended to continue current medication  Patient  Goals/Self-Care Activities . Patient will:  - take medications as prescribed check blood pressure weekly, document, and provide at future appointments target a minimum of 150 minutes of moderate intensity exercise weekly  Follow Up Plan: Telephone follow up appointment with  care management team member scheduled for: 4 months        Patient verbalizes understanding of instructions provided today and agrees to view in MyChart.  The pharmacy team will reach out to the patient again over the next 30 days.   Verner Chol, [facility]

## 2021-04-02 NOTE — Telephone Encounter (Signed)
MW pt has pending appt with you on 06/06 and she is requesting that if this appt needs to be kept, she would like for it to be a televisit.  She is having issues with her legs hurting.  She said that she is fine with cancelling the appt if it is not needed.  Please advise. Thanks   Last OV was 03/01/21 for COPD

## 2021-04-02 NOTE — Telephone Encounter (Signed)
Spoke with pt and advised per Dr Sherene Sires that it is ok to change appt to televisit. Pt verbalized understanding. Appt has been changed. Nothing further needed.

## 2021-04-02 NOTE — Telephone Encounter (Signed)
Fine with me

## 2021-04-03 DIAGNOSIS — I130 Hypertensive heart and chronic kidney disease with heart failure and stage 1 through stage 4 chronic kidney disease, or unspecified chronic kidney disease: Secondary | ICD-10-CM | POA: Diagnosis not present

## 2021-04-03 DIAGNOSIS — I4891 Unspecified atrial fibrillation: Secondary | ICD-10-CM | POA: Diagnosis not present

## 2021-04-03 DIAGNOSIS — M4804 Spinal stenosis, thoracic region: Secondary | ICD-10-CM | POA: Diagnosis not present

## 2021-04-03 DIAGNOSIS — M5431 Sciatica, right side: Secondary | ICD-10-CM | POA: Diagnosis not present

## 2021-04-03 DIAGNOSIS — I5033 Acute on chronic diastolic (congestive) heart failure: Secondary | ICD-10-CM | POA: Diagnosis not present

## 2021-04-03 DIAGNOSIS — J441 Chronic obstructive pulmonary disease with (acute) exacerbation: Secondary | ICD-10-CM | POA: Diagnosis not present

## 2021-04-06 ENCOUNTER — Telehealth: Payer: Self-pay | Admitting: Internal Medicine

## 2021-04-06 DIAGNOSIS — R102 Pelvic and perineal pain: Secondary | ICD-10-CM | POA: Diagnosis not present

## 2021-04-06 NOTE — Telephone Encounter (Signed)
Cardiology Moonlighter Note  Returned page from patient. Has history of AF. Was recently admitted and started on dofetilide. Was discharged on 5/19. Since then has been taking meds without issue. For the past day or so the patient has noticed jerking sensation in her hands. This has happened while she has tried to do things like brush her teeth or tie her shoes. She seems to have no control of when this happens. She describes this as like a tremor sort of sensation. She has no other symptoms. Has never had anything like this before. Eating and drinking normally. No syncope, presyncope, chest pain, palpitations, shortness of breath, nausea, vomiting, diarrhea, confusion, headache, blurry vision, double vision, weakness, sensory deficit. ECGs and labs during her recent admission reviewed. K and Mg were all within reasonable ranges and ECG was without significant QTc prolongation.   Unclear etiology for patient's symptoms. Seems a bit odd that she would develop this several weeks after starting dofetilide. This does come up online as a possible side effect of dofetilide (though uncommon in the setting of normal electrolyte levels). Given the lack of high risk features and the fact that she did well during her drug loading admission, I recommended she continue taking her medications through the weekend. Will send a message to Dr Ladona Ridgel for additional recommendations. Would probably be good to have her get electrolytes checked on Monday. I reviewed symptoms that would be concerning (I.e. those noted above) with the patient and her daughter. They understand that they should come to the ED immediately if she develops any of these.   Amber Jacks, MD Cardiology Fellow, PGY-8

## 2021-04-08 ENCOUNTER — Ambulatory Visit (INDEPENDENT_AMBULATORY_CARE_PROVIDER_SITE_OTHER): Payer: Medicare Other | Admitting: Internal Medicine

## 2021-04-08 ENCOUNTER — Telehealth: Payer: Self-pay | Admitting: Internal Medicine

## 2021-04-08 ENCOUNTER — Encounter: Payer: Self-pay | Admitting: Internal Medicine

## 2021-04-08 ENCOUNTER — Other Ambulatory Visit: Payer: Self-pay

## 2021-04-08 DIAGNOSIS — J9611 Chronic respiratory failure with hypoxia: Secondary | ICD-10-CM | POA: Diagnosis not present

## 2021-04-08 DIAGNOSIS — J9612 Chronic respiratory failure with hypercapnia: Secondary | ICD-10-CM

## 2021-04-08 DIAGNOSIS — J449 Chronic obstructive pulmonary disease, unspecified: Secondary | ICD-10-CM | POA: Diagnosis not present

## 2021-04-08 NOTE — Assessment & Plan Note (Signed)
02 dependent  since 07/02/10 >>  83% RA December 05, 2010       - ONO RA 08/05/12  :  Positive sat < 89 x 2:35m> repeat on 2lpm rec 08/12/2012  - 06/17/2013 reported desat with activity p Knee surgery > rec restart 2lpm with activity  - 06/27/2013   Walked 2lpm  x one lap @ 185 stopped due to sat 88% not sob , desat to 82% on RA just at the  Start of the walk  - 04/10/14  Sats  81% RA and then  Walked 3lpm x  2 laps @ 185 ft each stopped due to end of study, no desat  - HCO3  11/09/17     = 31 - HCO3  08/27/18 = 37   - HCO3  03/11/2019 = 33   - HC03   02/05/21     = 35      Reviewed target sats low 90s to avoid exacerbating hypercarbia but clinically doing fine on present setting so no changes needed   >>> f/u every 3 months, sooner if needed    Each maintenance medication was reviewed in detail including most importantly the difference between maintenance and as needed and under what circumstances the prns are to be used.  Please see AVS for specific  Instructions which are unique to this visit and I personally typed out  which were reviewed in detail over the phone with the patient and a copy provided via mychart

## 2021-04-08 NOTE — Telephone Encounter (Signed)
Unsure if coming from either med, would be easier to trial off of the hydrocodone instead of Tikosyn to see if symptoms improve. Hydrocodone has pt reported incidence of muscle spasms/tremor at 3%. She also takes multiple other CNS depressants in addition to hydrocodone (Lyrica, tramadol). There is also a potential risk of serotonin syndrome in using hydrocodone in combination with  Cymbalta, and symptoms of this include muscle twitching/loss of muscle coordination.

## 2021-04-08 NOTE — Telephone Encounter (Signed)
Pts daughter called to report that the pt started Tikosyn 03/28/21 and hydrocodone from Dr. Ethelene Hal 03/27/21....and on 04/04/21 she developed tremors and weakness.. she loses her grip very easily and has been "shaking".... otherwise no other symptoms such as slurred speech, headache, or difficulty walking or using one side.   She is not sure if a reaction to one or both of the meds she recently started.   Will need to forward to [facility] PA for review and recommendations.

## 2021-04-08 NOTE — Assessment & Plan Note (Signed)
Quit smoking May 2006       - PFT's  04/12/10 FEV1  1.21 (69%) ratio 77 and no change p B2,  DLC0 56%   VC 70%         - PFTs  08/08/2013 FEV1 1.21 (60%) ratio 86 and no change p B2 DLCO 79%  VC 72%  On symbicort 160 2bid  - PFT's  02/08/2018  FEV1 0.70 (40 % ) ratio 56 if use FEV1/VC  p 38 % improvement from saba p symb 160 prior to study with DLCO  78 % corrects to 147  % for alv volume   - 02/08/2018  After extensive coaching inhaler device  effectiveness =    75% with mdi and 90% with dpi so change to Trelegy > no change from sample so rec resume symb 160 2 bid as of 03/24/2018  - 04/18/2019    changed spiriva to smi  11/08/2019  After extensive coaching inhaler device,  effectiveness =    75% (short Ti) > continue symb / spiriva  - 11/08/2019 added Plan D = Deltasone x 6 days for flares  - 07/02/2020 change symbicort to  Budesonide and formoterol and continue spriva  - 04/08/2021 televisit :  spiriva changed to incruse by insurance company    Group D in terms of symptom/risk and laba/lama/ICS  therefore appropriate rx at this point >>>  Neb laba/ics and dpi LAMA > well compensated with min prn saba

## 2021-04-08 NOTE — Progress Notes (Signed)
Subjective:   Patient ID: [name], female    DOB: 10-30-1946    MRN: [id number]   Brief patient profile:  74  yowf  quit smoking in May 2006 with dx pna/ ards resumed full activity including yardwork at wt 140 and pft's c/w restrictive changes 04/2010 at wt 170 but reported improvement on saba so maintained chronically on symbicort 160  With pfts 02/08/18  c/w GOLD III copd if use FEV1/VC ratio    History of Present Illness  July 18, 2010  1st pulmonary office eval in ER era c/o doe x 6 months progressive indolent onset with copd vs pf.  Ultimately required hosp 8/30-9/1 dx copd/pf ? cause on sulfasalzine and ACE inhib better on 02 and advair but very hoarse with sense of chest congestion and cough with white mucus day > night.  doe x > slow adls.stop advair Start symbicort 160 2 puffs first thing  in am and 2 puffs again in pm about 12 hours later  Work on inhaler technique:   stop benazapril start benicar 40/25  one daily Use 02 sleeping and any activity other than sitting still   04/10/2014 f/u ov/[name] re: restrictive lung dz/ RA/psoriatric/ symbicort 160 2bid  Chief Complaint  Patient presents with  . Follow-up    Pt states was advised to f/u per [employer].  Pt has been having increased SOB- relates to stress from dealing with her spouse's illness. She is using o2 pretty much 24/7.   trying to do more than baseline activity and finds she needs 02 more. rec Work on inhaler technique:   Ok to just use the symbicort 160 2 puffs in am to see what difference if any this makes in your breathing  Change 02 to 2lpm 24/7 and use 3lpm with activity    06/04/15  cabg      11/10/2017  f/u ov/[name] re: AB / pf related to ALI vs RA / MO  Chief Complaint  Patient presents with  . Follow-up    Breathing has been worse over the past 1-2 months. She states she gets winded just walking from her house to her car. She has been using her albuterol inhaler 4 x daily on average.   on 2lpm at rest  and sleeping needed up to 4lpm continous while doing rehab  but only has pulsed to 3lpm at home per [employer] Much better only while on prednisone / poor hfa noted  Doe = MMRC3 = can't walk 100 yards even at a slow pace at a flat grade s stopping due to sob  Even on 02 but not sure about sats as does not check rec Goal is to keep sats above 90%  - call if you want [employer] to re-evaluate your ambulatory needs  Prednisone 10 mg take  4 each am x 2 days,   2 each am x 2 days,  1 each am x 2 days and stop  Work on inhaler technique:      04/05/18  aecopd > pred x 6 days > back to baseline      10/11/2018  f/u ov/[name] re:  COPD GOLD III/ 02 dep / maint on just symb 160 2bid (no better with incruse) Chief Complaint  Patient presents with  . Follow-up    Breathing is the same. She is using her albuterol inhaler once per wk on average.   Dyspnea:  HT, sometimes [employer] on 3lpm MMRC3 = can't walk 100 yards even at a slow  pace at a flat grade s stopping due to sob   Cough: none Sleeping: on back books under hob plus pillows = 30 degrees  SABA use: rare  02: 2lpm but turns to 3lpm poc with activity    rec Prednisone 10 mg take  4 each am x 2 days,   2 each am x 2 days,  1 each am x 2 days and stop  Work on inhaler technique:   Please schedule a follow up visit in 4 months but call sooner if needed  - add consider adding spiriva next ov if not doing better and insurance will cover   04/18/2019  f/u ov/[name] re: GOLD III spirometry but 02 dep/ maint on symb 160 2bid/spiriva  Chief Complaint  Patient presents with  . Follow-up    Breathing not improving since the last visit. She states she has not been active at all. She has been using her albuterol daily.   Dyspnea:  No longer shopping due to covid 19 does delivery only  Cough: occ spells/ non productive never noct   Sleeping: on elevated to 30 degrees  SABA use: daily use of nebulizer  02: 2lpm but 3lpm with activity  rec Plan A = Automatic  =Symbicort 160 x 2 pffs each am followed by spriiva 2 puffs and 12 hours later symbicort 160 x 2  Work on inhaler technique: Plan B = Backup Only use your albuterol(proventil) inhaler as a rescue medication  Plan C = Crisis - only use your albuterol nebulizer if you first try Plan B and it fails to help > ok to use the nebulizer up to every 4 hours but if start needing it regularly call for immediate appointment    06/14/2019  f/u ov/[name] re:  GOLD III spirometry but 02 dep 24/7/ maint on symb 160/spiriva [name] Chief Complaint  Patient presents with  . Follow-up    Breathing is overall doing about the same. She is using her albuterol once per wk on average.   Dyspnea:  room to room now on up to 3lpm with sats ok but feels losing ground steadily since prior ov in terms of activity tol/ housebound mostly at this poit   Cough: some spells daytime, not noct, mostly mucoid production Sleeping: 30 degrees elevation helps some SABA use: rarely hfa/ never neb at this point  02: 2lpm bedtime up to 3lpm with activity  rec Prednisone 10 mg Take 4 for three days 3 for three days 2 for three days 1 for three days and stop  No change in medications    07/15/2019  f/u ov/[name] re: copd  GOLD III spirometry / 02 dep maint on symb/spiriva/singulair  Chief Complaint  Patient presents with  . COPD III spirometry if use FEV1/VC p saba    Doing better since last visit with the combination of Symbicort and Spiriva   Dyspnea:  Up to 5 min on street x 3lpm = MMRC3 = can't walk 100 yards even at a slow pace at a flat grade s stopping due to sob  But not checking sats at peak ex  Cough: not now  Sleeping: 30 degrees hob / no am flares of cough/ wheeze/sob   SABA use: none 02: 2lpm hs, 2lpm resting, 3lpm vacuuming or walking outside 3lpm POC  rec Work on inhaler technique:    No change in medications or 02     11/08/2019  f/u ov/[name] re: GOLD III spirom but 02 dep  Chief Complaint  Patient presents  with  . Follow-up    Pt states shes been feeling about the same. Not as active. Coughs a little bit with "yellowish tan" phlegm. Denies any fever or chills.   Dyspnea: MMRC3 = can't walk 100 yards even at a slow pace at a flat grade s stopping due to sob   Cough: min rattling  Sleeping: 30 degrees hob  SABA use: much less  02: 2lpm hs, 2lpm resting, 3lpm vacuuming or walking outside 3lpm POC  rec Plan A = Automatic =Symbicort 160 x 2 pffs each am followed by spriiva 2 puffs and 12 hours later symbicort 160 x 2  Work on inhaler technique  Plan B = Backup Only use your albuterol(proventil) inhaler as a rescue medication  Plan C = Crisis - only use your albuterol nebulizer if you first try Plan B and it fails to help > ok to use the nebulizer up to every 4 hours but if start needing it regularly call for immediate appointment Plan D = Deltasone  If plan C not working well, add Prednisone 10 mg take  4 each am x 2 days,   2 each am x 2 days,  1 each am x 2 days and stop    07/02/2020  f/u ov/[name] re: GOLD III/ 02 dep maint on symb/spriva and freq pred  Chief Complaint  Patient presents with  . Follow-up    Gets extremely sob with activity  Dyspnea:  Across the room baseline / down the drive today did  better flat grade/ always better p prednisone   Cough: none  Sleeping: electric bed  30 degrees  SABA use: rarely  02:  3lpm 24/7  rec Plan A = Automatic =  spriiva 2 puffs each am (work on practice inhalations Instead of symbicort =  Budesonide and formoterol first thing in am and 12 hours later  in nebulizer Plan B = Backup Only use your albuterol(proventil) inhaler as a rescue medication Plan C = Crisis - only use your albuterol nebulizer if you first try Plan B and it fails to help > ok to use the nebulizer up to every 4 hours but if start needing it regularly call for immediate appointment Plan D = Deltasone  If plan C not working well, add Prednisone 10 mg take  4 each am x 2 days,    2 each am x 2 days,  1 each am x 2 days and stop    Admit date: 12/27/2020 Discharge date: 12/31/2020   Recommendations for Outpatient Follow-up:  1. Follow up with PCP in 1-2 weeks 2. Follow-up with cardiology in 2 weeks 3. Follow-up with pulmonology in 1 week 4. Please arrange outpatient sleep study 5. Please obtain BMP/CBC in one week 6. 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 fib on Eliquis, CKD-3, psoriatic arthritis, sciatica, recent COVID-19 infection presented with progressive shortness of breath, near syncope and RLE radiculopathic pain, and admitted for acute on chronic hypoxic respiratory failure and near syncope in the setting of acute on chronic diastolic CHF, possible COPD exacerbation and anemia. She was a started on IV Lasix, BiPAP, antibiotics and nebulizers. Cardiology consulted on admission and evaluated patient. PCCM consulted and added steroid.  She was subsequently weaned  down and brought back to her 3 L of oxygen which is her baseline.  Subsequently her IV Lasix was transitioned to oral torsemide 20 mg p.o. daily by cardiology.  She was initially transitioned from long-acting to short-acting oral Cardizem but subsequently flipped back into sinus rhythm and cardiology switched her back to long-acting Cardizem to 40 mg p.o. daily.  She had mildly elevated troponins but they were flat suggestive of demand ischemia.  TEE did not show any wall motion abnormality.  She also had some anemia and her hemoglobin dropped to 7.8 for which she was transfused 1 unit of PRBC on 12/28/2020 along with 1 dose of IV for him.  Iron studies indicated iron deficiency anemia for which she is being prescribed oral iron at  discharge.  Her radiculopathic pain improved.  She was assessed by PT OT and they recommended home health with intermittent supervision and her daughter has committed to provide their supervision and she is willing to take patient home today.  She has been cleared by cardiology.  Rest of her medical issues remained stable.  She will follow with cardiology, PCP and pulmonology as outpatient.  She had received enough of IV antibiotics and steroids for her COPD.  Currently she has no wheezing and she is back to her baseline so no antibiotics or steroids are being prescribed.  Discharge Diagnoses:  Principal Problem:   Acute cardiogenic pulmonary edema (HCC) Active Problems:   Hypothyroidism   Essential hypertension   PULMONARY FIBROSIS ILD POST INFLAMMATORY CHRONIC   ACUTE ON CHRONIC DIASTOLIC HEART FAILURE   GERD (gastroesophageal reflux disease)   Morbid (severe) obesity due to excess calories (HCC)   S/P CABG x 3   Hyperlipidemia   COPD exacerbation (HCC)   Elevated troponin   Atrial flutter (HCC)   Acute on chronic respiratory failure with hypoxia and hypercapnia (HCC)   History of COVID-19   Sciatica   Diarrhea      01/01/2021  f/u ov/[name] re:  S/p covid/ rapid afib / off pred since ? And confused with instructions  Chief Complaint  Patient presents with  . Follow-up    Recent admission to hospital for COPD flare. Breathing is improving some, but not back to baseline. She has some wheezing and cough with clear sputum. She rarely uses her albuterol inhaler.    Dyspnea:   More limited by balance weakness  Cough: minimal Sleeping: electric bed x 30 degrees SABA use: rarely  02: 2lpm  at rest, sleep, 3 walking sats 98% Covid status:   2 vax  plus infected with likely omicron rec Goal of palliative care is to control the pain or the sense of breathless without eliminating the brain Let Dr Ethelene Hal know the R leg pain resolved but the same back pain came but radiates to L side. No  change in your medications Keep your original appointment to see me - call sooner if needed.    Virtual Visit via Telephone Note 04/08/2021   I connected with [name] on 04/08/21 at 5:00 pm  by telephone and verified that I am speaking with the correct person using two identifiers. Pt is at home and this call made from my office with no other participants    I discussed the limitations, risks, security and privacy concerns of performing an evaluation and management service by telephone and the availability of in person appointments. I also discussed with the patient that there may be a patient responsible charge related to this  service. The patient expressed understanding and agreed to proceed.   History of Present Illness: Dyspnea: mostly staying in bed due to back/leg pain  Cough: none  Sleeping: electric bed degrees  SABA use: none  02: 2lpm at rest, 3lpm with activity    No obvious day to day or daytime variability or assoc excess/ purulent sputum or mucus plugs or hemoptysis or cp or chest tightness, subjective wheeze or overt sinus or hb symptoms.   Also denies any obvious fluctuation of symptoms with weather or environmental changes or other aggravating or alleviating factors except as outlined above.   Meds reviewed/ med reconciliation completed     No outpatient medications have been marked as taking for the 04/08/21 encounter (Appointment) with Nyoka Cowden, MD.         Observations/Objective: Talking in full sentences s conversational sob/ no spont cough, min hoarseness    Assessment and Plan: See problem list for active a/p's   Follow Up Instructions: See avs for instructions unique to this ov which includes revised/ updated med list     I discussed the assessment and treatment plan with the patient. The patient was provided an opportunity to ask questions and all were answered. The patient agreed with the plan and demonstrated an understanding of  the instructions.   The patient was advised to call back or seek an in-person evaluation if the symptoms worsen or if the condition fails to improve as anticipated.  I provided 25 minutes of non-face-to-face time during this encounter.   Sandrea Hughs, MD

## 2021-04-08 NOTE — Telephone Encounter (Signed)
Pt c/o medication issue:  1. Name of Medication: Tikosyn  2. How are you currently taking this medication (dosage and times per day)?  2 times a day  3. Are you having a reaction (difficulty breathing--STAT)?   4. What is your medication issue? Tremors and Jerking- not sure if it is Tikosyn- this was one of the side effects- it said if this happen to call the office right away

## 2021-04-08 NOTE — Telephone Encounter (Signed)
Pt notified of recommendation from Pharm-D she has a call into the orthopedic MD that prescribed the hydrocodone. Pt does endorse her symptoms became noticeable after starting hydrocodone. Pt will update [place] once medication changes occur.

## 2021-04-08 NOTE — Telephone Encounter (Signed)
Pts daughter called in and stated that the pt was waking up with trembles and she was put on a new medication (tikosyn) by the cardiologist and was told if she should have any side effects to please call the cardiologist and they have but also wanted to let Dr. Ardyth Harps know about it as well.  And they had called the cardiologist but will call them back to see if they can be seen today 04/08/2021.

## 2021-04-09 NOTE — Telephone Encounter (Signed)
I spoke with the patient and informed her of the message below. Virtual visit scheduled for 06/08

## 2021-04-09 NOTE — Telephone Encounter (Signed)
Pts daughter is calling back stating that they spoke with the Cardiology and they are saying that some of her medication need to be re-evaluated b/c pt is having Serotonin syndrome.  Pt is willing to stop taking tramadol she stopped on last night and this morning and is willing to take the hyrdocodone-acetaminophen (NORCO) 10-325 MG due to it is helping with the leg pain.  Daughter would like to have a call back.

## 2021-04-10 ENCOUNTER — Other Ambulatory Visit (HOSPITAL_COMMUNITY): Payer: Self-pay | Admitting: *Deleted

## 2021-04-10 ENCOUNTER — Telehealth: Payer: Medicare Other | Admitting: Internal Medicine

## 2021-04-10 DIAGNOSIS — M25552 Pain in left hip: Secondary | ICD-10-CM | POA: Diagnosis not present

## 2021-04-10 MED ORDER — DOFETILIDE 250 MCG PO CAPS: 250.0000 ug | ORAL_CAPSULE | Freq: Two times a day (BID) | ORAL | 3 refills | Status: DC

## 2021-04-11 ENCOUNTER — Telehealth: Payer: Self-pay | Admitting: Internal Medicine

## 2021-04-11 ENCOUNTER — Telehealth: Payer: Self-pay | Admitting: Pharmacist

## 2021-04-11 DIAGNOSIS — R0683 Snoring: Secondary | ICD-10-CM

## 2021-04-11 NOTE — Telephone Encounter (Signed)
Tried [employer] ok per DPR- no answer, LMTCB.

## 2021-04-11 NOTE — Chronic Care Management (AMB) (Signed)
    Chronic Care Management Pharmacy Assistant   Name: [name]  MRN: [id number] DOB: 24-Oct-1946   Spoke with patient and patient stated along with her daughter that they mailed in the Eliquis application to [facility] where patients cardiologist and [facility] cardiologist is. I am going to call [facility] for more information. I informed patient I will refill out the eliquis application and will be filling out the incruse ellipta application regardless and for patient and daughter to keep an eye out for mail. Patient and daughter both verbalized understanding.  Called [facility] and spoke with Nettie Elm front desk and left message to check on status of patients eliquis application on whether I need to fill out another or if they have sent to [facility] yet. Will wait for call back.  Will go ahead and complete application for Incruse Ellipta.  Completed application for Incruse Ellipta. Will wait for call back regarding what to do about patients Eliquis application from [facility].   Called back and Nettie Elm stated that [facility] placed a note saying patient needs a new Eliquis application completed because they never received the first one from patient. I will complete another Eliquis application.   Completed a new application for patients Eliquis. Sent to [employer] to print and mail the Eliquis application ot patient as well for patient to give to [facility] to complete and fax to company.

## 2021-04-12 NOTE — Telephone Encounter (Signed)
Spoke with the pt's daughter, Elmarie Shiley  She states pt is having hip issues and is thinking of having some sort of epidural to help with this  She wants to know if Dr Sherene Sires is okay with this  She states pt's breathing seems to be doing okay, but does notice apneas at night  She never had sleep study done  Please advise thanks

## 2021-04-12 NOTE — Telephone Encounter (Signed)
Ok to order epidural  Ok to order sleep study if pt willing to wear mask if positive - otherwise we can discuss at next ov

## 2021-04-15 NOTE — Telephone Encounter (Signed)
Spoke with patient's daughter. She verbalized understanding. She also stated that since she called last week, the orthopedic doctor has now recommended a total hip replacement instead of just injections for pain management.   MW, please advise if you are ok this?   Sleep study has been ordered.

## 2021-04-16 ENCOUNTER — Encounter: Payer: Self-pay | Admitting: Internal Medicine

## 2021-04-16 ENCOUNTER — Telehealth (INDEPENDENT_AMBULATORY_CARE_PROVIDER_SITE_OTHER): Payer: Medicare Other | Admitting: Internal Medicine

## 2021-04-16 VITALS — Wt 202.0 lb

## 2021-04-16 DIAGNOSIS — I4819 Other persistent atrial fibrillation: Secondary | ICD-10-CM | POA: Diagnosis not present

## 2021-04-16 DIAGNOSIS — Z09 Encounter for follow-up examination after completed treatment for conditions other than malignant neoplasm: Secondary | ICD-10-CM

## 2021-04-16 DIAGNOSIS — L89159 Pressure ulcer of sacral region, unspecified stage: Secondary | ICD-10-CM

## 2021-04-16 DIAGNOSIS — M5441 Lumbago with sciatica, right side: Secondary | ICD-10-CM

## 2021-04-16 DIAGNOSIS — J449 Chronic obstructive pulmonary disease, unspecified: Secondary | ICD-10-CM

## 2021-04-16 NOTE — Progress Notes (Signed)
Virtual Visit via Video Note  I connected with [name] on 04/16/21 at  1:00 PM EDT by a video enabled telemedicine application and verified that I am speaking with the correct person using two identifiers.  Location patient: home Location provider: work office Persons participating in the virtual visit: patient, provider, daughter  I discussed the limitations of evaluation and management by telemedicine and the availability of in person appointments. The patient expressed understanding and agreed to proceed.   HPI: She is a hospital follow-up and to follow-up medical conditions.  She was admitted to the hospital from May 16 through May 19 for Tikosyn loading for persistent A. fib.  She has been doing well and has follow-up with cardiology soon.  She is having a sleep study soon.  Her main issue is her continued back pain.  She is scheduled this week for a nerve block and an epidural injection.  She has been taking hydrocodone, Lyrica.  She has now become incontinent.  She is developing pressure sores given inability to stand/walk for any period of time.  She was started on Cymbalta as an exchange for Celexa due to concerns for QT prolongation since starting on Tikosyn.  Her mood is stable.   ROS: Constitutional: Denies fever, chills, diaphoresis, appetite change. HEENT: Denies photophobia, eye pain, redness, hearing loss, ear pain, congestion, sore throat, rhinorrhea, sneezing, mouth sores, trouble swallowing, neck pain, neck stiffness and tinnitus.   Respiratory: Denies SOB, DOE, cough, chest tightness,  and wheezing.   Cardiovascular: Denies chest pain, palpitations and leg swelling.  Gastrointestinal: Denies nausea, vomiting, abdominal pain, diarrhea, constipation, blood in stool and abdominal distention.  Genitourinary: Denies dysuria, urgency, frequency, hematuria, flank pain and difficulty urinating.  Endocrine: Denies: hot or cold intolerance, sweats, changes in hair  or nails, polyuria, polydipsia. Musculoskeletal: Positive for myalgias, back pain, joint swelling, arthralgias and gait problem.  Skin: Denies pallor, rash and wound.  Neurological: Denies dizziness, seizures, syncope, weakness, light-headedness, numbness and headaches.  Hematological: Denies adenopathy. Easy bruising, personal or family bleeding history  Psychiatric/Behavioral: Denies suicidal ideation, mood changes, confusion, nervousness, sleep disturbance and agitation   Past Medical History:  Diagnosis Date   Arthritis    OA RIGHT KNEE WITH PAIN   Barrett esophagus    Bradycardia 06/01/2015   CAD in native artery    a. NSTEMI 05/2015 s/p emergent CABG.   Chronic diastolic (congestive) heart failure (HCC)    Chronic kidney disease, stage 3a (HCC)    Chronic respiratory failure (HCC)    Chronic respiratory failure with hypoxia and hypercapnia (HCC) 02/04/2010   Followed in Pulmonary clinic/ [facility] [place]       - 02 dependent  since 07/02/10 >>  83% RA December 05, 2010       - ONO RA 08/05/12  :  Positive sat < 89 x 2:75m> repeat on 2lpm rec 08/12/2012  - 06/17/2013 reported desat with activity p Knee surgery > rec restart 2lpm with activity  - 06/27/2013   Walked 2lpm  x one lap @ 185 stopped due to sat 88% not sob , desat to 82% on RA just at th   COPD III spirometry if use FEV1/VC p saba  07/18/2010   Quit smoking May 2006       - PFT's  04/12/10 FEV1  1.21 (69%) ratio 77 and no change p B2,  DLC0 56%   VC 70%         - PFTs  08/08/2013 FEV1 1.21 (60%) ratio 86 and no change p B2 DLCO 79%  VC 72%  On symbicort 160 2bid  - PFT's  02/08/2018  FEV1 0.70 (40 % ) ratio 56 if use FEV1/VC  p 38 % improvement from saba p symb 160 prior to study with DLCO  78 % corrects to 147  % for alv volume   - 02/08/2018   Cough variant asthma 02/26/2011   Followed in Pulmonary clinic/ [facility] [place]  - PFT's  06/04/15  FEV1 1.20 (67 % ) ratio 83  p 6 % improvement from saba with DLCO  80 % corrects to  132 % for alv volume      - Clinical dx based on response to symbicort       FENO 09/16/2016  =   96 on symbicort 160 2bid > added singulair  Allergy profile 09/16/2016 >  Eos 0.5 /  IgE  78 neg RAST  -  Referred to rehab 04/29/2017 > completed   Essential hypertension 04/20/2007   Qualifier: Diagnosis of  By: Marcelyn Ditty, RN, Katy Fitch    GERD (gastroesophageal reflux disease)    History of ARDS 2006   History of home oxygen therapy    AT NIGHT WHEN SLEEPING 2 L / MIN NASAL CANNULA   Hyperlipidemia 07/12/2015   Hypothyroidism    Intracranial hemorrhage (HCC) 2019   a. small intracranial hemorrhage in setting of HTN.   Mild carotid artery disease (HCC)    a. Duplex 1-39% bilaterally in 2016.   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   Followed as Primary Care Patient/ [facility] [place]  -s/p ARDS 2006 with bacteremic S  Pna       - CT chest 07/03/10 Nonspecific PF mostly upper lobes       - CT chest 12/03/10 acute gg changes and effusions c/w chf - PFT's  02/08/2018  FVC 0.64 (28 %)   with DLCO  78 % corrects to 147 % for alv volume      Rheumatic disease    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   SOB (shortness of breath) on exertion     Past Surgical History:  Procedure Laterality Date   ABDOMINOPLASTY     CARDIAC CATHETERIZATION N/A 06/01/2015   Procedure: Left Heart Cath and Coronary Angiography;  Surgeon: Runell Gess, MD;  Location: [facility] INVASIVE CV LAB;  Service: Cardiovascular;  Laterality: N/A;   CARDIOVERSION N/A 02/12/2021   Procedure: CARDIOVERSION;  Surgeon: Meriam Sprague, MD;  Location: [facility] ENDOSCOPY;  Service: Cardiovascular;  Laterality: N/A;   CARPAL TUNNEL RELEASE     CHOLECYSTECTOMY      CORONARY ARTERY BYPASS GRAFT N/A 06/04/2015   Procedure: CORONARY ARTERY BYPASS GRAFT times three            with left internal mammary artery and right leg saphenous vein;  Surgeon: Alleen Borne, MD;  Location: [facility] OR;  Service: Open Heart Surgery;  Laterality: N/A;   cosmetic breast surgery     JOINT REPLACEMENT     KNEE ARTHROSCOPY Left    TEE WITHOUT CARDIOVERSION  06/04/2015   Procedure: TRANSESOPHAGEAL ECHOCARDIOGRAM (TEE);  Surgeon: Alleen Borne, MD;  Location: [facility] OR;  Service: Open Heart Surgery;;   TEE WITHOUT CARDIOVERSION N/A 02/12/2021   Procedure: TRANSESOPHAGEAL ECHOCARDIOGRAM (TEE);  Surgeon: Meriam Sprague, MD;  Location: [facility] ENDOSCOPY;  Service: Cardiovascular;  Laterality: N/A;   TOTAL KNEE ARTHROPLASTY Left    TOTAL KNEE ARTHROPLASTY Right 06/06/2013   Procedure: RIGHT TOTAL KNEE ARTHROPLASTY;  Surgeon: Loanne Drilling, MD;  Location: [facility] ORS;  Service: Orthopedics;  Laterality: Right;    Family History  Problem Relation Age of Onset   Breast cancer Mother    Coronary artery disease Father    Rheum arthritis Father     SOCIAL HX:   reports that she quit smoking about 16 years ago. Her smoking use included cigarettes. She has a 87.75 pack-year smoking history. She has never used smokeless tobacco. She reports that she does not drink alcohol and does not use drugs.   Current Outpatient Medications:    acetaminophen (TYLENOL) 650 MG CR tablet, Take 650 mg by mouth every 8 (eight) hours., Disp: , Rfl:    albuterol (VENTOLIN HFA) 108 (90 Base) MCG/ACT inhaler, Inhale 2 puffs into the lungs every 4 (four) hours as needed (shortness of breath, if you can't catch your breath)., Disp: 8 g, Rfl: 5   amoxicillin (AMOXIL) 500 MG tablet, Take 1,000 mg by mouth See admin instructions. Dental procedure, Disp: , Rfl:    apixaban (ELIQUIS) 5 MG TABS tablet, Take 1 tablet (5 mg total) by mouth 2 (two) times daily., Disp: 180 tablet, Rfl: 1   atorvastatin (LIPITOR) 40 MG tablet, TAKE 1  TABLET BY MOUTH  DAILY AT 6 PM, Disp: 90 tablet, Rfl: 3   budesonide (PULMICORT) 0.25 MG/2ML nebulizer solution, One twice daily, Disp: 120 mL, Rfl: 12   CALCIUM PO, Take 1 tablet by mouth daily., Disp: , Rfl:    Cholecalciferol (VITAMIN D3) 2000 UNITS TABS, Take 2,000 Int'l Units by mouth daily., Disp: , Rfl:    cyclobenzaprine (FLEXERIL) 5 MG tablet, TAKE 1 TABLET BY MOUTH AT  BEDTIME AS NEEDED FOR  MUSCLE SPASM(S) (Patient taking differently: TAKE 1 TABLET BY MOUTH AT  BEDTIME AS NEEDED FOR  MUSCLE SPASM(S)), Disp: 90 tablet, Rfl: 0   diltiazem (CARDIZEM CD) 240 MG 24 hr capsule, Take 1 capsule (240 mg total) by mouth daily., Disp: 90 capsule, Rfl: 1   dofetilide (TIKOSYN) 250 MCG capsule, Take 1 capsule (250 mcg total) by mouth 2 (two) times daily., Disp: 60 capsule, Rfl: 3   DULoxetine (CYMBALTA) 30 MG capsule, Take 1 capsule (30 mg total) by mouth daily., Disp: 90 capsule, Rfl: 1   formoterol (PERFOROMIST) 20 MCG/2ML nebulizer solution, Take 2 mLs (20 mcg total) by nebulization 2 (two) times daily. Use in nebulizer twice daily perfectly regularly, Disp: 120 mL, Rfl: 11   golimumab (SIMPONI ARIA) 50 MG/4ML SOLN injection, Inject 50 mg into the vein every 8 (eight) weeks. , Disp: , Rfl:    HYDROcodone-acetaminophen (NORCO) 10-325 MG tablet, Take 1 tablet by mouth 3 (three) times daily as needed., Disp: , Rfl:    hydrocortisone 2.5 % cream, Apply 1 application topically 2 (two) times daily as needed (itchiness)., Disp: , Rfl:    ketoconazole (NIZORAL) 2 % cream, Apply 1 application topically daily as needed for irritation., Disp: , Rfl:    leflunomide (ARAVA) 20 MG tablet, Take 1 tablet (20 mg total) by mouth daily., Disp: , Rfl:    levothyroxine (SYNTHROID) 125 MCG tablet, TAKE 1 TABLET BY MOUTH EVERY DAY, Disp: 90 tablet,  Rfl: 1   losartan (COZAAR) 50 MG tablet, Take 1 tablet (50 mg total) by mouth daily., Disp: 90 tablet, Rfl: 3   montelukast (SINGULAIR) 10 MG tablet, Take 1 tablet by mouth  daily., Disp: , Rfl:    nystatin (MYCOSTATIN/NYSTOP) powder, APPLY 1 APPLICATION        TOPICALLY 3 TIMES A DAY, Disp: 15 g, Rfl: 0   OXYGEN, Inhale 2 L into the lungs continuous. continuous o2, Disp: , Rfl:    pantoprazole (PROTONIX) 40 MG tablet, Take 1 tablet (40 mg total) by mouth daily before breakfast., Disp: 90 tablet, Rfl: 3   pregabalin (LYRICA) 50 MG capsule, Take 50 mg by mouth at bedtime., Disp: , Rfl:    torsemide (DEMADEX) 20 MG tablet, Take 1 tablet (20 mg total) by mouth daily., Disp: 90 tablet, Rfl: 3   traMADol (ULTRAM) 50 MG tablet, Take 1 tablet (50 mg total) by mouth 2 (two) times daily., Disp: 60 tablet, Rfl: 1   triamcinolone cream (KENALOG) 0.1 %, Apply 1 application topically 2 (two) times daily as needed (for psoriasis). , Disp: , Rfl:    umeclidinium bromide (INCRUSE ELLIPTA) 62.5 MCG/INH AEPB, Inhale 1 puff into the lungs daily., Disp: 3 each, Rfl: 2   vitamin B-12 (CYANOCOBALAMIN) 1000 MCG tablet, Take 1,000 mcg by mouth daily., Disp: , Rfl:    ferrous sulfate 325 (65 FE) MG tablet, Take 1 tablet (325 mg total) by mouth daily with breakfast., Disp: 30 tablet, Rfl: 0  EXAM:   VITALS per patient if applicable: None reported  GENERAL: alert, oriented, appears well and in no acute distress  HEENT: atraumatic, conjunttiva clear, no obvious abnormalities on inspection of external nose and ears  NECK: normal movements of the head and neck  LUNGS: on inspection no signs of respiratory distress, breathing rate appears normal, no obvious gross increased work of breathing, gasping or wheezing  CV: no obvious cyanosis  MS: moves all visible extremities without noticeable abnormality  PSYCH/NEURO: pleasant and cooperative, no obvious depression or anxiety, speech and thought processing grossly intact  ASSESSMENT AND PLAN:   Hospital discharge follow-up  Persistent atrial fibrillation (HCC)  Acute right-sided low back pain with right-sided sciatica  Stage 4 very  severe COPD by GOLD classification (HCC)  Morbid obesity (HCC)  Pressure injury of skin of sacral region, unspecified injury stage  -Hopefully the combination of epidural injection and nerve block will help provide some pain relief. -I am concerned about her urinary incontinence and report of pressure sores that have become more prominent since her back pain has started.  I will arrange for home health RN to assess pressure sores and treat as needed.  I have advised daughter to use frequent repositioning and consider a donut pillow.  Time spent: 32 minutes reviewing chart, interviewing patient and daughter and formulating plan of care.     I discussed the assessment and treatment plan with the patient. The patient was provided an opportunity to ask questions and all were answered. The patient agreed with the plan and demonstrated an understanding of the instructions.   The patient was advised to call back or seek an in-person evaluation if the symptoms worsen or if the condition fails to improve as anticipated.    Chaya Jan, MD  [facility]

## 2021-04-16 NOTE — Telephone Encounter (Signed)
I called and spoke with the pt's daughter and made her aware of response per MW. She verbalized understanding. She states that she is not scheduled for PSG until 06-17-21. Imade her appt with MW for 06/24/21 and made this in person. If she feels will need changed to a televisit she will call back.

## 2021-04-16 NOTE — Telephone Encounter (Cosign Needed)
2nd attempt

## 2021-04-16 NOTE — Telephone Encounter (Signed)
Not a very good candidate for sugery and would need at least a televisit for me to clear her and get her sleep study done as well

## 2021-04-17 ENCOUNTER — Telehealth: Payer: Self-pay | Admitting: Internal Medicine

## 2021-04-17 DIAGNOSIS — M25552 Pain in left hip: Secondary | ICD-10-CM | POA: Diagnosis not present

## 2021-04-17 NOTE — Telephone Encounter (Signed)
Pt call and stated she want a call back didn't tell me what she was calling about.

## 2021-04-18 ENCOUNTER — Telehealth: Payer: Self-pay | Admitting: Internal Medicine

## 2021-04-18 DIAGNOSIS — M5416 Radiculopathy, lumbar region: Secondary | ICD-10-CM | POA: Diagnosis not present

## 2021-04-18 NOTE — Telephone Encounter (Signed)
Left a detailed message at the pts home number to return a call with more information as to how we can help her.

## 2021-04-18 NOTE — Telephone Encounter (Signed)
Follow Up:     [name] is calling from [employer]. She is calling to check on the status of pt's Pt Assistance Application for her Eliquis. She wants to know if pt needs to do another application?

## 2021-04-19 NOTE — Telephone Encounter (Signed)
**Note De-Identified  Obfuscation** I called [name] back at [employer] but got no answer so I left a VM advising that the pt should fill out another BMSPAF application for Eliquis as we never received one for her and are unaware that one was needed/started.  I did leave my name and the office phone number in the VM message so they can call back if they have questions.

## 2021-04-22 ENCOUNTER — Emergency Department (HOSPITAL_COMMUNITY): Payer: Medicare Other

## 2021-04-22 ENCOUNTER — Emergency Department (HOSPITAL_COMMUNITY)
Admission: EM | Admit: 2021-04-22 | Discharge: 2021-04-22 | Disposition: A | Discharge status: Home / Self Care | Payer: Medicare Other | Attending: Emergency Medicine | Admitting: Emergency Medicine

## 2021-04-22 DIAGNOSIS — I130 Hypertensive heart and chronic kidney disease with heart failure and stage 1 through stage 4 chronic kidney disease, or unspecified chronic kidney disease: Secondary | ICD-10-CM | POA: Diagnosis not present

## 2021-04-22 DIAGNOSIS — J449 Chronic obstructive pulmonary disease, unspecified: Secondary | ICD-10-CM | POA: Insufficient documentation

## 2021-04-22 DIAGNOSIS — K760 Fatty (change of) liver, not elsewhere classified: Secondary | ICD-10-CM | POA: Diagnosis not present

## 2021-04-22 DIAGNOSIS — I517 Cardiomegaly: Secondary | ICD-10-CM | POA: Diagnosis not present

## 2021-04-22 DIAGNOSIS — Z87891 Personal history of nicotine dependence: Secondary | ICD-10-CM | POA: Insufficient documentation

## 2021-04-22 DIAGNOSIS — R748 Abnormal levels of other serum enzymes: Secondary | ICD-10-CM | POA: Insufficient documentation

## 2021-04-22 DIAGNOSIS — R072 Precordial pain: Secondary | ICD-10-CM | POA: Diagnosis not present

## 2021-04-22 DIAGNOSIS — Z951 Presence of aortocoronary bypass graft: Secondary | ICD-10-CM | POA: Insufficient documentation

## 2021-04-22 DIAGNOSIS — R079 Chest pain, unspecified: Secondary | ICD-10-CM | POA: Diagnosis not present

## 2021-04-22 DIAGNOSIS — R0789 Other chest pain: Secondary | ICD-10-CM | POA: Diagnosis not present

## 2021-04-22 DIAGNOSIS — J811 Chronic pulmonary edema: Secondary | ICD-10-CM | POA: Diagnosis not present

## 2021-04-22 DIAGNOSIS — I5032 Chronic diastolic (congestive) heart failure: Secondary | ICD-10-CM | POA: Diagnosis not present

## 2021-04-22 DIAGNOSIS — Z7901 Long term (current) use of anticoagulants: Secondary | ICD-10-CM | POA: Insufficient documentation

## 2021-04-22 DIAGNOSIS — J45909 Unspecified asthma, uncomplicated: Secondary | ICD-10-CM | POA: Insufficient documentation

## 2021-04-22 DIAGNOSIS — E039 Hypothyroidism, unspecified: Secondary | ICD-10-CM | POA: Insufficient documentation

## 2021-04-22 DIAGNOSIS — R1013 Epigastric pain: Secondary | ICD-10-CM | POA: Insufficient documentation

## 2021-04-22 DIAGNOSIS — I4891 Unspecified atrial fibrillation: Secondary | ICD-10-CM | POA: Diagnosis not present

## 2021-04-22 DIAGNOSIS — N183 Chronic kidney disease, stage 3 unspecified: Secondary | ICD-10-CM | POA: Insufficient documentation

## 2021-04-22 DIAGNOSIS — Z79899 Other long term (current) drug therapy: Secondary | ICD-10-CM | POA: Insufficient documentation

## 2021-04-22 DIAGNOSIS — Z96653 Presence of artificial knee joint, bilateral: Secondary | ICD-10-CM | POA: Insufficient documentation

## 2021-04-22 DIAGNOSIS — Z7951 Long term (current) use of inhaled steroids: Secondary | ICD-10-CM | POA: Diagnosis not present

## 2021-04-22 DIAGNOSIS — I10 Essential (primary) hypertension: Secondary | ICD-10-CM | POA: Diagnosis not present

## 2021-04-22 LAB — BASIC METABOLIC PANEL
Anion gap: 6 (ref 5–15)
BUN: 33 mg/dL — ABNORMAL HIGH (ref 8–23)
CO2: 40 mmol/L — ABNORMAL HIGH (ref 22–32)
Calcium: 9.2 mg/dL (ref 8.9–10.3)
Chloride: 96 mmol/L — ABNORMAL LOW (ref 98–111)
Creatinine, Ser: 1.34 mg/dL — ABNORMAL HIGH (ref 0.44–1.00)
GFR, Estimated: 42 mL/min — ABNORMAL LOW (ref >60)
Glucose, Bld: 111 mg/dL — ABNORMAL HIGH (ref 70–99)
Potassium: 4.6 mmol/L (ref 3.5–5.1)
Sodium: 142 mmol/L (ref 135–145)

## 2021-04-22 LAB — CBC WITH DIFFERENTIAL/PLATELET
Abs Immature Granulocytes: 0.07 10*3/uL (ref 0.00–0.07)
Basophils Absolute: 0 10*3/uL (ref 0.0–0.1)
Basophils Relative: 0 %
Eosinophils Absolute: 0.1 10*3/uL (ref 0.0–0.5)
Eosinophils Relative: 1 %
HCT: 38.8 % (ref 36.0–46.0)
Hemoglobin: 11.4 g/dL — ABNORMAL LOW (ref 12.0–15.0)
Immature Granulocytes: 1 %
Lymphocytes Relative: 19 %
Lymphs Abs: 2.4 10*3/uL (ref 0.7–4.0)
MCH: 30.6 pg (ref 26.0–34.0)
MCHC: 29.4 g/dL — ABNORMAL LOW (ref 30.0–36.0)
MCV: 104 fL — ABNORMAL HIGH (ref 80.0–100.0)
Monocytes Absolute: 1.6 10*3/uL — ABNORMAL HIGH (ref 0.1–1.0)
Monocytes Relative: 13 %
Neutro Abs: 8.5 10*3/uL — ABNORMAL HIGH (ref 1.7–7.7)
Neutrophils Relative %: 66 %
Platelets: 281 10*3/uL (ref 150–400)
RBC: 3.73 MIL/uL — ABNORMAL LOW (ref 3.87–5.11)
RDW: 14.7 % (ref 11.5–15.5)
WBC: 12.7 10*3/uL — ABNORMAL HIGH (ref 4.0–10.5)
nRBC: 0 % (ref 0.0–0.2)

## 2021-04-22 LAB — HEPATIC FUNCTION PANEL
ALT: 13 U/L (ref 0–44)
AST: 15 U/L (ref 15–41)
Albumin: 2.9 g/dL — ABNORMAL LOW (ref 3.5–5.0)
Alkaline Phosphatase: 50 U/L (ref 38–126)
Bilirubin, Direct: <0.1 mg/dL (ref 0.0–0.2)
Total Bilirubin: 0.7 mg/dL (ref 0.3–1.2)
Total Protein: 5.5 g/dL — ABNORMAL LOW (ref 6.5–8.1)

## 2021-04-22 LAB — TROPONIN I (HIGH SENSITIVITY)
Troponin I (High Sensitivity): 54 ng/L — ABNORMAL HIGH (ref <18)
Troponin I (High Sensitivity): 50 ng/L — ABNORMAL HIGH (ref <18)

## 2021-04-22 LAB — LIPASE, BLOOD: Lipase: 69 U/L — ABNORMAL HIGH (ref 11–51)

## 2021-04-22 NOTE — Discharge Instructions (Addendum)
Call your cardiologist and let them know you are seen here in the emergency department.  You will need to follow-up with them.  If you have any new or worsening symptoms please seek reevaluation emergency department

## 2021-04-22 NOTE — ED Notes (Signed)
Pt d/c home per MD order. Discharge summary reviewed with pt, pt verbalizes understanding. Pt d/c home with daughter who has home oxygen for transport. Pt voicing no complaints at discharge.

## 2021-04-22 NOTE — ED Notes (Signed)
Pt transported to xray 

## 2021-04-22 NOTE — ED Triage Notes (Signed)
Pt to ED via EMS from home c/o acute onset chest pain that started this morning. Marland Kitchen HX AFIB, and triple bypass. Denies N/V/SHOB. On EMS arrival pt reported pain 8/10, pressure and tightness to center of chest. Chest pain eased up with time and now currently denies. On EMS arrival Afib irregular rate 80-150bpm. Mostly sustaining in afib 110bpm. 324mg  aspirin given by EMS . #20 LAC. Last VS 98%2L Home o2 user. RR22 118/70. 

## 2021-04-22 NOTE — ED Provider Notes (Signed)
[facility] EMERGENCY DEPARTMENT Provider Note   CSN: [id number] Arrival date & time: 04/22/21  1118    History Chief Complaint  Patient presents with   Chest Pain    [name] is a 75 y.o. female with past medical history significant for CAD, CHF, COPD, hypertension, GERD who presents for evaluation of chest pain.  Patient states she had an episode of substernal chest pain which lasted approximately 5 minutes earlier today.  Pain did not radiate into her back, left arm or jaw.  No associated nausea, vomiting, diaphoresis or shortness of breath.  No cough.  When EMS arrived patient had a pain of 8/10.  She denies any pain, additional evaluation.  She is currently anticoagulated for atrial fibrillation.  Has not missed any doses.  No palpitations.  No PND orthopnea.  Was given 324 mg aspirin with EMS.  Sx lasted for 5- 10 minutes and resolved. Normal 2 L of oxygen at home without difficulty.  No fever, chills abdominal pain, diarrhea, dysuria, back pain.  No lower extremity edema.  Current pain is 0/10.  She does not typically get any chest pain at home with exertion.  She is able to perform her normal ADLs without dyspnea or chest tightness.  Denies additional aggravating or alleviating factors.  History obtained from patient and past medical records.  No interpreter used  HPI     Past Medical History:  Diagnosis Date   Arthritis    OA RIGHT KNEE WITH PAIN   Barrett esophagus    Bradycardia 06/01/2015   CAD in native artery    a. NSTEMI 05/2015 s/p emergent CABG.   Chronic diastolic (congestive) heart failure (HCC)    Chronic kidney disease, stage 3a (HCC)    Chronic respiratory failure (HCC)    Chronic respiratory failure with hypoxia and hypercapnia (HCC) 02/04/2010   Followed in Pulmonary clinic/ [facility]/ [facility]       - 02 dependent  since 07/02/10 >>  83% RA December 05, 2010       - ONO RA 08/05/12  :  Positive sat < 89 x 2:69m> repeat on 2lpm  rec 08/12/2012  - 06/17/2013 reported desat with activity p Knee surgery > rec restart 2lpm with activity  - 06/27/2013   Walked 2lpm  x one lap @ 185 stopped due to sat 88% not sob , desat to 82% on RA just at th   COPD III spirometry if use FEV1/VC p saba  07/18/2010   Quit smoking May 2006       - PFT's  04/12/10 FEV1  1.21 (69%) ratio 77 and no change p B2,  DLC0 56%   VC 70%         - PFTs  08/08/2013 FEV1 1.21 (60%) ratio 86 and no change p B2 DLCO 79%  VC 72%  On symbicort 160 2bid  - PFT's  02/08/2018  FEV1 0.70 (40 % ) ratio 56 if use FEV1/VC  p 38 % improvement from saba p symb 160 prior to study with DLCO  78 % corrects to 147  % for alv volume   - 02/08/2018   Cough variant asthma 02/26/2011   Followed in Pulmonary clinic/ [facility]/ [facility]  - PFT's  06/04/15  FEV1 1.20 (67 % ) ratio 83  p 6 % improvement from saba with DLCO  80 % corrects to 132 % for alv volume      - Clinical dx based on response  to symbicort       FENO 09/16/2016  =   96 on symbicort 160 2bid > added singulair  Allergy profile 09/16/2016 >  Eos 0.5 /  IgE  78 neg RAST  -  Referred to rehab 04/29/2017 > completed   Essential hypertension 04/20/2007   Qualifier: Diagnosis of  By: Marcelyn Ditty, RN, Katy Fitch    GERD (gastroesophageal reflux disease)    History of ARDS 2006   History of home oxygen therapy    AT NIGHT WHEN SLEEPING 2 L / MIN NASAL CANNULA   Hyperlipidemia 07/12/2015   Hypothyroidism    Intracranial hemorrhage (HCC) 2019   a. small intracranial hemorrhage in setting of HTN.   Mild carotid artery disease (HCC)    a. Duplex 1-39% bilaterally in 2016.   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   Followed as Primary Care Patient/  [facility]/ [facility]  -s/p ARDS 2006 with bacteremic S  Pna       - CT chest 07/03/10 Nonspecific PF mostly upper lobes       - CT chest 12/03/10 acute gg changes and effusions c/w chf - PFT's  02/08/2018  FVC 0.64 (28 %)   with DLCO  78 % corrects to 147 % for alv volume      Rheumatic disease    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   SOB (shortness of breath) on exertion     Patient Active Problem List   Diagnosis Date Noted   Persistent atrial fibrillation (HCC) 02/05/2021   Chronic heart failure with preserved ejection fraction (HCC) 01/07/2021   Stage 3b chronic kidney disease (HCC) 01/07/2021   Paroxysmal atrial fibrillation (HCC) 01/03/2021   History of COVID-19 12/27/2020   Sciatica 12/27/2020   Diarrhea 12/27/2020   Atrial flutter (HCC) 12/25/2020   Secondary hypercoagulable state (HCC) 12/25/2020   Chronic respiratory failure with hypoxia and hypercapnia (HCC) 04/20/2019   Severe episode of recurrent major depressive disorder, without psychotic features (HCC) 11/09/2018   COPD (chronic obstructive pulmonary disease) (HCC) 11/02/2018   COPD with acute exacerbation (HCC) 11/01/2018   CAP (community acquired pneumonia) 10/28/2018   ICH (intracerebral hemorrhage) (HCC) 08/25/2018   Hyperlipidemia 07/12/2015   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   Bradycardia 06/01/2015   Morbid (severe) obesity due to excess calories (HCC) 04/22/2015   Postoperative anemia due to acute blood loss 06/08/2013   History of home oxygen therapy 06/07/2013   GERD (gastroesophageal reflux disease) 06/07/2013   Barrett's esophagus 06/07/2013   OA (osteoarthritis) of knee 06/06/2013   COPD III spirometry if use FEV1/VC p saba  07/18/2010   PULMONARY FIBROSIS ILD POST INFLAMMATORY CHRONIC 07/18/2010   Compression fracture of thoracic vertebra (HCC) 07/02/2010   Hypothyroidism 04/20/2007   Essential hypertension 04/20/2007   PSORIATIC ARTHRITIS 04/20/2007    Past Surgical History:  Procedure Laterality Date    ABDOMINOPLASTY     CARDIAC CATHETERIZATION N/A 06/01/2015   Procedure: Left Heart Cath and Coronary Angiography;  Surgeon: Runell Gess, MD;  Location: [facility] INVASIVE CV LAB;  Service: Cardiovascular;  Laterality: N/A;   CARDIOVERSION N/A 02/12/2021   Procedure: CARDIOVERSION;  Surgeon: Meriam Sprague, MD;  Location: [facility] ENDOSCOPY;  Service:  Cardiovascular;  Laterality: N/A;   CARPAL TUNNEL RELEASE     CHOLECYSTECTOMY     CORONARY ARTERY BYPASS GRAFT N/A 06/04/2015   Procedure: CORONARY ARTERY BYPASS GRAFT times three            with left internal mammary artery and right leg saphenous vein;  Surgeon: Alleen Borne, MD;  Location: [facility] OR;  Service: Open Heart Surgery;  Laterality: N/A;   cosmetic breast surgery     JOINT REPLACEMENT     KNEE ARTHROSCOPY Left    TEE WITHOUT CARDIOVERSION  06/04/2015   Procedure: TRANSESOPHAGEAL ECHOCARDIOGRAM (TEE);  Surgeon: Alleen Borne, MD;  Location: [facility] OR;  Service: Open Heart Surgery;;   TEE WITHOUT CARDIOVERSION N/A 02/12/2021   Procedure: TRANSESOPHAGEAL ECHOCARDIOGRAM (TEE);  Surgeon: Meriam Sprague, MD;  Location: [facility] ENDOSCOPY;  Service: Cardiovascular;  Laterality: N/A;   TOTAL KNEE ARTHROPLASTY Left    TOTAL KNEE ARTHROPLASTY Right 06/06/2013   Procedure: RIGHT TOTAL KNEE ARTHROPLASTY;  Surgeon: Loanne Drilling, MD;  Location: [facility] ORS;  Service: Orthopedics;  Laterality: Right;     OB History   No obstetric history on file.     Family History  Problem Relation Age of Onset   Breast cancer Mother    Coronary artery disease Father    Rheum arthritis Father     Social History   Tobacco Use   Smoking status: Former    Packs/day: 1.50    Years: 58.50    Pack years: 87.75    Types: Cigarettes    Quit date: 03/03/2005    Years since quitting: 16.1   Smokeless tobacco: Never  Vaping Use   Vaping Use: Never used  Substance Use Topics   Alcohol use: No   Drug use: No    Home Medications Prior to Admission medications    Medication Sig Start Date End Date Taking? Authorizing Provider  acetaminophen (TYLENOL) 650 MG CR tablet Take 650 mg by mouth every 8 (eight) hours.    [provider]  albuterol (VENTOLIN HFA) 108 (90 Base) MCG/ACT inhaler Inhale 2 puffs into the lungs every 4 (four) hours as needed (shortness of breath, if you can't catch your breath). 06/04/20   Nyoka Cowden, MD  amoxicillin (AMOXIL) 500 MG tablet Take 1,000 mg by mouth See admin instructions. Dental procedure    [provider]  apixaban (ELIQUIS) 5 MG TABS tablet Take 1 tablet (5 mg total) by mouth 2 (two) times daily. 01/28/21   Riley Lam A, MD  atorvastatin (LIPITOR) 40 MG tablet TAKE 1 TABLET BY MOUTH  DAILY AT 6 PM 03/14/21   Chandrasekhar, Mahesh A, MD  budesonide (PULMICORT) 0.25 MG/2ML nebulizer solution One twice daily 07/02/20   Nyoka Cowden, MD  CALCIUM PO Take 1 tablet by mouth daily.    [provider]  Cholecalciferol (VITAMIN D3) 2000 UNITS TABS Take 2,000 Int'l Units by mouth daily.    [provider]  cyclobenzaprine (FLEXERIL) 5 MG tablet TAKE 1 TABLET BY MOUTH AT  BEDTIME AS NEEDED FOR  MUSCLE SPASM(S) Patient taking differently: TAKE 1 TABLET BY MOUTH AT  BEDTIME AS NEEDED FOR  MUSCLE SPASM(S) 05/01/20   [name], [name], MD  diltiazem (CARDIZEM CD) 240 MG 24 hr capsule Take 1 capsule (240 mg total) by mouth daily. 12/06/20   Christell Constant, MD  dofetilide (TIKOSYN) 250 MCG capsule Take 1 capsule (250 mcg total) by mouth 2 (two) times daily. 04/10/21   Fenton,  Clint R, PA  DULoxetine (CYMBALTA) 30 MG capsule Take 1 capsule (30 mg total) by mouth daily. 03/14/21   [name], [name], MD  ferrous sulfate 325 (65 FE) MG tablet Take 1 tablet (325 mg total) by mouth daily with breakfast. 01/10/21 02/09/21  Riley Lam A, MD  formoterol (PERFOROMIST) 20 MCG/2ML nebulizer solution Take 2 mLs (20 mcg total) by nebulization 2 (two) times daily. Use in  nebulizer twice daily perfectly regularly 07/02/20   Nyoka Cowden, MD  golimumab (SIMPONI ARIA) 50 MG/4ML SOLN injection Inject 50 mg into the vein every 8 (eight) weeks.     [provider]  HYDROcodone-acetaminophen (NORCO) 10-325 MG tablet Take 1 tablet by mouth 3 (three) times daily as needed. 03/27/21   [provider]  hydrocortisone 2.5 % cream Apply 1 application topically 2 (two) times daily as needed (itchiness). 01/30/21   [provider]  ketoconazole (NIZORAL) 2 % cream Apply 1 application topically daily as needed for irritation. 01/30/21   [provider]  leflunomide (ARAVA) 20 MG tablet Take 1 tablet (20 mg total) by mouth daily. 03/15/19   [name], [name], MD  levothyroxine (SYNTHROID) 125 MCG tablet TAKE 1 TABLET BY MOUTH EVERY DAY 06/20/20   [name], [name], MD  losartan (COZAAR) 50 MG tablet Take 1 tablet (50 mg total) by mouth daily. 01/17/21   Chandrasekhar, Mahesh A, MD  montelukast (SINGULAIR) 10 MG tablet Take 1 tablet by mouth daily. 03/22/21   [provider]  nystatin (MYCOSTATIN/NYSTOP) powder APPLY 1 APPLICATION        TOPICALLY 3 TIMES A DAY 03/07/21   [name], [name], MD  OXYGEN Inhale 2 L into the lungs continuous. continuous o2    [provider]  pantoprazole (PROTONIX) 40 MG tablet Take 1 tablet (40 mg total) by mouth daily before breakfast. 01/10/21   Nyoka Cowden, MD  pregabalin (LYRICA) 50 MG capsule Take 50 mg by mouth at bedtime. 03/13/21   [provider]  torsemide (DEMADEX) 20 MG tablet Take 1 tablet (20 mg total) by mouth daily. 01/29/21   Chandrasekhar, Rondel Jumbo, MD  traMADol (ULTRAM) 50 MG tablet Take 1 tablet (50 mg total) by mouth 2 (two) times daily. 01/03/20   Worthy Rancher B, FNP  triamcinolone cream (KENALOG) 0.1 % Apply 1 application topically 2 (two) times daily as needed (for psoriasis).     [provider]  umeclidinium bromide (INCRUSE ELLIPTA)  62.5 MCG/INH AEPB Inhale 1 puff into the lungs daily. 03/14/21   Nyoka Cowden, MD  vitamin B-12 (CYANOCOBALAMIN) 1000 MCG tablet Take 1,000 mcg by mouth daily.    [provider]    Allergies    Gabapentin  Review of Systems   Review of Systems  Constitutional: Negative.   HENT: Negative.    Respiratory: Negative.    Cardiovascular:  Positive for chest pain. Negative for palpitations and leg swelling.  Gastrointestinal: Negative.   Genitourinary: Negative.   Musculoskeletal: Negative.   Skin: Negative.   Neurological: Negative.   All other systems reviewed and are negative.  Physical Exam Updated Vital Signs BP (!) 129/109   Pulse (!) 110   Temp 99.5 F (37.5 C) (Oral)   Resp 18   Ht  (1.499 m)   Wt 90.7 kg   SpO2 97%   BMI 40.40 kg/m   Physical Exam Vitals and nursing note reviewed.  Constitutional:      General: She is  not in acute distress.    Appearance: She is well-developed. She is not ill-appearing, toxic-appearing or diaphoretic.  HENT:     Head: Atraumatic.  Eyes:     Pupils: Pupils are equal, round, and reactive to light.  Cardiovascular:     Rate and Rhythm: Normal rate.     Pulses:          Radial pulses are 2+ on the right side and 2+ on the left side.       Dorsalis pedis pulses are 2+ on the right side and 2+ on the left side.     Heart sounds: Normal heart sounds.  Pulmonary:     Effort: Pulmonary effort is normal. No respiratory distress.     Breath sounds: Normal breath sounds.     Comments: Clear to auscultation bilaterally.  Speaks in full sentences without difficulty. Chest:     Comments: Equal rise and fall to chest wall. Abdominal:     General: Bowel sounds are normal. There is no distension.     Palpations: Abdomen is soft.     Tenderness: There is no guarding or rebound.     Comments: Soft, nontender.  No rebound or guarding.  Musculoskeletal:        General: Normal range of motion.     Cervical back: Normal range  of motion.     Right lower leg: No tenderness. No edema.     Left lower leg: No tenderness. No edema.     Comments: No bony tenderness.  Moves all 4 extremities.  No lower extremity edema.  Compartments soft  Skin:    General: Skin is warm and dry.     Capillary Refill: Capillary refill takes less than 2 seconds.     Comments: No rashes or lesions.  Neurological:     General: No focal deficit present.     Mental Status: She is alert.  Psychiatric:        Mood and Affect: Mood normal.    ED Results / Procedures / Treatments   Labs (all labs ordered are listed, but only abnormal results are displayed) Labs Reviewed  CBC WITH DIFFERENTIAL/PLATELET - Abnormal; Notable for the following components:      Result Value   WBC 12.7 (*)    RBC 3.73 (*)    Hemoglobin 11.4 (*)    MCV 104.0 (*)    MCHC 29.4 (*)    Neutro Abs 8.5 (*)    Monocytes Absolute 1.6 (*)    All other components within normal limits  BASIC METABOLIC PANEL - Abnormal; Notable for the following components:   Chloride 96 (*)    CO2 40 (*)    Glucose, Bld 111 (*)    BUN 33 (*)    Creatinine, Ser 1.34 (*)    GFR, Estimated 42 (*)    All other components within normal limits  LIPASE, BLOOD - Abnormal; Notable for the following components:   Lipase 69 (*)    All other components within normal limits  HEPATIC FUNCTION PANEL - Abnormal; Notable for the following components:   Total Protein 5.5 (*)    Albumin 2.9 (*)    All other components within normal limits  TROPONIN I (HIGH SENSITIVITY) - Abnormal; Notable for the following components:   Troponin I (High Sensitivity) 54 (*)    All other components within normal limits  TROPONIN I (HIGH SENSITIVITY) - Abnormal; Notable for the following components:   Troponin I (High Sensitivity) 50 (*)  All other components within normal limits    EKG None  Radiology DG Chest 2 View  Result Date: 04/22/2021 CLINICAL DATA:  Acute chest pain today. EXAM: CHEST - 2 VIEW  COMPARISON:  12/29/2020 and prior studies FINDINGS: Cardiomegaly and CABG changes noted. Pulmonary vascular congestion is identified. Elevated RIGHT hemidiaphragm again noted. No pneumothorax or definite pleural effusion. IMPRESSION: Cardiomegaly with pulmonary vascular congestion. Electronically Signed   By: Harmon Pier M.D.   On: 04/22/2021 12:10   US Abdomen Limited RUQ (LIVER/GB)  Result Date: 04/22/2021 CLINICAL DATA:  Epigastric pain, elevated lipase. EXAM: ULTRASOUND ABDOMEN LIMITED RIGHT UPPER QUADRANT COMPARISON:  None. FINDINGS: Gallbladder: Surgically absent. Common bile duct: Diameter: 7 mm, within normal limits. Liver: Diffusely increased in echogenicity. No definite focal lesion. Portal vein is patent on color Doppler imaging with normal direction of blood flow towards the liver. Other: None. IMPRESSION: Hepatic steatosis. Electronically Signed   By: Leanna Battles M.D.   On: 04/22/2021 15:08    Procedures Procedures   Medications Ordered in ED Medications - No data to display  ED Course  I have reviewed the triage vital signs and the nursing notes.  Pertinent labs & imaging results that were available during my care of the patient were reviewed by me and considered in my medical decision making (see chart for details).  24 old here for evaluation of chest pain.  She is afebrile, nonseptic, not ill-appearing.  Substernal chest pain lasted approximately 5 to 10 minutes and self resolved.  No associated diaphoresis, nausea or vomiting.  No back pain, left arm pain or jaw pain.  By the time she came here to emergency department this was resolved.  No lower extremity edema.  No PND orthopnea.  She is chronically anticoagulated for atrial fibrillation has not missed any doses.  No history of PE.  Wells criteria low risk.  Plan on labs, imaging and reassess  Labs and imaging personally reviewed and interpreted:  CBC WBC at 12.7, no infectious symptoms BMP Leukos 111, BUN 33, creatinine  1.34 at baseline [place] function panel without LFT elevation Troponin 54>> 50 Lipase 69 DG chest with stable cardiomegaly and vascular congestion EKG without ischemic changes  [place] RUQ with hepatic steatosis  Patient reassessed.  No complaints.  No pain here in ED.  Discussed minimally elevated troponin as well as elevated lipase.  She denies any abdominal pain, emesis.  Will obtain ultrasound, orders placed for hepatic function panel.  We will plan on trending her troponins.  Prior troponins in the 30s with prior hospitalization  Patient reassessed.  She has not had any pain throughout her entire stay here in the emergency department.  She is tolerating p.o. intake.  Discussed lab and imaging work-up.  Patient states she would like to go home and follow-up outpatient.  I feel this is reasonable.  I low suspicion for acute ACS, PE, dissection, intra-abdominal process causing symptoms.  She will return for any worsening symptoms  VSS, no tracheal deviation, no JVD or new murmur, RRR, breath sounds equal bilaterally, EKG without acute abnormalities, and negative CXR. Pt has been advised to return to the ED if CP becomes exertional, associated with diaphoresis or nausea, radiates to left jaw/arm, worsens or becomes concerning in any way  The patient has been appropriately medically screened and/or stabilized in the ED. I have low suspicion for any other emergent medical condition which would require further screening, evaluation or treatment in the ED or require inpatient management.  Patient  is hemodynamically stable and in no acute distress.  Patient able to ambulate in department prior to ED.  Evaluation does not show acute pathology that would require ongoing or additional emergent interventions while in the emergency department or further inpatient treatment.  I have discussed the diagnosis with the patient and answered all questions.  Pain is been managed while in the emergency department and  patient has no further complaints prior to discharge.  Patient is comfortable with plan discussed in room and is stable for discharge at this time.  I have discussed strict return precautions for returning to the emergency department.  Patient was encouraged to follow-up with PCP/specialist refer to at discharge.   Patient seen by attending, Dr. Clarice Pole who agrees with above treatment, plan disposition   MDM Rules/Calculators/A&P                          Final Clinical Impression(s) / ED Diagnoses Final diagnoses:  Epigastric pain  Elevated lipase  Precordial pain    Rx / DC Orders ED Discharge Orders     None        Zeniya Lapidus A, PA-C 04/22/21 1632    Arby Barrette, MD 05/01/21 [phone]

## 2021-04-23 DIAGNOSIS — M0589 Other rheumatoid arthritis with rheumatoid factor of multiple sites: Secondary | ICD-10-CM | POA: Diagnosis not present

## 2021-04-23 DIAGNOSIS — M5136 Other intervertebral disc degeneration, lumbar region: Secondary | ICD-10-CM | POA: Diagnosis not present

## 2021-04-23 DIAGNOSIS — M8580 Other specified disorders of bone density and structure, unspecified site: Secondary | ICD-10-CM | POA: Diagnosis not present

## 2021-04-23 DIAGNOSIS — I252 Old myocardial infarction: Secondary | ICD-10-CM | POA: Diagnosis not present

## 2021-04-23 DIAGNOSIS — Z111 Encounter for screening for respiratory tuberculosis: Secondary | ICD-10-CM | POA: Diagnosis not present

## 2021-04-23 DIAGNOSIS — M150 Primary generalized (osteo)arthritis: Secondary | ICD-10-CM | POA: Diagnosis not present

## 2021-04-23 DIAGNOSIS — R5383 Other fatigue: Secondary | ICD-10-CM | POA: Diagnosis not present

## 2021-04-23 DIAGNOSIS — Z79899 Other long term (current) drug therapy: Secondary | ICD-10-CM | POA: Diagnosis not present

## 2021-04-23 DIAGNOSIS — L409 Psoriasis, unspecified: Secondary | ICD-10-CM | POA: Diagnosis not present

## 2021-04-24 ENCOUNTER — Other Ambulatory Visit (HOSPITAL_COMMUNITY): Payer: Self-pay | Admitting: Physician Assistant

## 2021-04-26 ENCOUNTER — Telehealth: Payer: Self-pay | Admitting: Internal Medicine

## 2021-04-26 NOTE — Telephone Encounter (Signed)
Donita w/[facility] is calling to get verbal orders for skill nursing 1 week 9.  May leave a detail msg on her secured voice mail.

## 2021-04-26 NOTE — Telephone Encounter (Signed)
Verbal orders given to Donita. 

## 2021-05-02 DIAGNOSIS — M0589 Other rheumatoid arthritis with rheumatoid factor of multiple sites: Secondary | ICD-10-CM | POA: Diagnosis not present

## 2021-05-03 ENCOUNTER — Telehealth: Payer: Self-pay | Admitting: Internal Medicine

## 2021-05-03 NOTE — Telephone Encounter (Signed)
I spoke with patient's daughter.  She reports patient has swelling in her feet and lower legs, abdominal area and face.  First noticed yesterday.  Feet painful due to swelling.  Had shortness of breath last night and this AM.  Daughter gave patient breathing treatment and this helped shortness of breath.  Denies any shortness of breath at this time. Unable to weigh daily due to back and hip issues.  Taking torsemide as ordered and has not missed any doses. Watches salt intake. Patient does not think hear rate is fast and does not know when she is afib.  Will review with Dr Graciela Husbands (DOD)

## 2021-05-03 NOTE — Telephone Encounter (Signed)
Reviewed with Dr Graciela Husbands and patient should take torsemide 40 mg now and tomorrow AM.  Can take 40 mg on Sunday if swelling not improved. If improved return to normal dose on Sunday.  Will need EKG next week.  Patient's daughter notified.  Patient is already scheduled to see Dr Ladona Ridgel on 7/7 and daughter is aware of this appointmetn

## 2021-05-03 NOTE — Telephone Encounter (Signed)
Thornton Park (daughter) called to see if Fleet Contras can call her and not her mother to discuss getting help with her mother at home.  The patient is in COPD aspiration and she wants to keep her out of the hospital because of her hip and leg issues. She can't get out of bed by herself and she knows that the hospital won't give her the care that she can give her and make sure that she's not sitting around in poop.  Kimberley wants to know is there anything that she can do at home or have a nurse to come to the house. She has to have 24 hours care now.  Please advise  Thornton Park  [phone]

## 2021-05-03 NOTE — Telephone Encounter (Signed)
Pts daughter is calling back in wanting to know why she has not rec'd a call back from Dr. Ardyth Harps.  Daughter is aware that the provider is not in the office and will be back in the office on Friday 05/10/2021 she stated that it was not communicated to her this morning and if she knew that she would have taken her mother to the ER this morning.  Daughter declined to make an appointment with another provider.

## 2021-05-03 NOTE — Telephone Encounter (Signed)
Pt c/o swelling: STAT is pt has developed SOB within 24 hours  How much weight have you gained and in what time span?  Unable to monitor weight gain. Patient's daughter states the patient is unable to stand and walk on her own due to the swelling and pain so she has a hard time getting on the scale.  If swelling, where is the swelling located?  Feet and legs (patient's daughter states patient's feet are in a lot of pain)  Are you currently taking a fluid pill?  Yes, patient takes torsemide (DEMADEX) 20 MG tablet once daily. She is hoping to have this increased because she does not want to take the patient to the hospital. Are you currently SOB?  No   Do you have a log of your daily weights (if so, list)?  No log available   Have you gained 3 pounds in a day or 5 pounds in a week?  Unsure   Have you traveled recently?  No

## 2021-05-04 DIAGNOSIS — Z79891 Long term (current) use of opiate analgesic: Secondary | ICD-10-CM | POA: Diagnosis not present

## 2021-05-04 DIAGNOSIS — M25552 Pain in left hip: Secondary | ICD-10-CM | POA: Diagnosis not present

## 2021-05-04 DIAGNOSIS — M5416 Radiculopathy, lumbar region: Secondary | ICD-10-CM | POA: Diagnosis not present

## 2021-05-07 NOTE — Telephone Encounter (Signed)
FYI - Daughter was calling because of edema to legs and feet.  Daughter called the cardiologist who "increase the fluid pill".  The edema has improved.  Daughter also wanted Dr Ardyth Harps to know she is moving the patient to her home and [facility] and SNF is not needed at this time.

## 2021-05-09 ENCOUNTER — Ambulatory Visit: Payer: Medicare Other | Admitting: Internal Medicine

## 2021-05-17 ENCOUNTER — Telehealth: Payer: Self-pay | Admitting: Internal Medicine

## 2021-05-17 NOTE — Telephone Encounter (Signed)
Patient has moved from [place] to [facility].  [facility] is who she had for physical therapy, however since she moved they said she will need a new order (for upper and lower body strength.   -no upper or lower body strength -can't walk now--in a wheelchair 24 hours a day -needs assistance to get in an out of wheelchair

## 2021-05-23 NOTE — Telephone Encounter (Signed)
Left detailed message on machine for Kimberly(daughter) for name to who's attention and fax number to [facility] for orders.

## 2021-05-23 NOTE — Progress Notes (Signed)
Cardiology Office Note:    Date:  05/24/2021   ID:  Amber, Huff 07-Oct-1946, MRN [id number]  PCP:  [name], Amber Patricia, MD   [facility]  Cardiologist:  Riley Lam MD Advanced Practice Provider:  Dr. Sharrell Ku Electrophysiologist:  None   CC: Follow up AFl and HF  History of Present Illness:    [name] is a 75 y.o. female with a hx of hx of CAD s/p NSTEMI with 3 VD s/p LIMA but with preserved EF; COPD and ILD on Home O2 diastolic HF seen by Dr. Delton See.  Seen 11/30/20 for virtual visit for SVT.  In interim of this visit, patient had heart monitor notable for AFl.  Seen by AF clinic, stared on [facility], had some LE edema issues on placed on torsemide.  Last seen 01/24/21 In interim of this visit, patient had ED visit for chest pain (doesn't have memory of this).  05/24/21.  Patient notes that she is doing OK.  Notes occasional chest tingles.  Does not remember ED visit.  One episode of chest tingles since 6/22 ED visit.  No radiating CP.  No changing in her breathing and no PND/Orthopnea.  No weight gain or leg swelling.  No palpitations or syncope.  Has hip pain that has worsened her QOL and decreased her mobility; for this she uses a cream that has not changed her QTc   Past Medical History:  Diagnosis Date   Arthritis    OA RIGHT KNEE WITH PAIN   Barrett esophagus    Bradycardia 06/01/2015   CAD in native artery    a. NSTEMI 05/2015 s/p emergent CABG.   Chronic diastolic (congestive) heart failure (HCC)    Chronic kidney disease, stage 3a (HCC)    Chronic respiratory failure (HCC)    Chronic respiratory failure with hypoxia and hypercapnia (HCC) 02/04/2010   Followed in Pulmonary clinic/ [facility]/ [facility]       - 02 dependent  since 07/02/10 >>  83% RA December 05, 2010       - ONO RA 08/05/12  :  Positive sat < 89 x 2:76m> repeat on 2lpm rec 08/12/2012  - 06/17/2013 reported desat with activity p Knee surgery > rec  restart 2lpm with activity  - 06/27/2013   Walked 2lpm  x one lap @ 185 stopped due to sat 88% not sob , desat to 82% on RA just at th   COPD III spirometry if use FEV1/VC p saba  07/18/2010   Quit smoking May 2006       - PFT's  04/12/10 FEV1  1.21 (69%) ratio 77 and no change p B2,  DLC0 56%   VC 70%         - PFTs  08/08/2013 FEV1 1.21 (60%) ratio 86 and no change p B2 DLCO 79%  VC 72%  On symbicort 160 2bid  - PFT's  02/08/2018  FEV1 0.70 (40 % ) ratio 56 if use FEV1/VC  p 38 % improvement from saba p symb 160 prior to study with DLCO  78 % corrects to 147  % for alv volume   - 02/08/2018   Cough variant asthma 02/26/2011   Followed in Pulmonary clinic/ [facility]/ [facility]  - PFT's  06/04/15  FEV1 1.20 (67 % ) ratio 83  p 6 % improvement from saba with DLCO  80 % corrects to 132 % for alv volume      -  Clinical dx based on response to symbicort       FENO 09/16/2016  =   96 on symbicort 160 2bid > added singulair  Allergy profile 09/16/2016 >  Eos 0.5 /  IgE  78 neg RAST  -  Referred to rehab 04/29/2017 > completed   Essential hypertension 04/20/2007   Qualifier: Diagnosis of  By: Marcelyn Ditty, RN, Katy Fitch    GERD (gastroesophageal reflux disease)    History of ARDS 2006   History of home oxygen therapy    AT NIGHT WHEN SLEEPING 2 L / MIN NASAL CANNULA   Hyperlipidemia 07/12/2015   Hypothyroidism    Intracranial hemorrhage (HCC) 2019   a. small intracranial hemorrhage in setting of HTN.   Mild carotid artery disease (HCC)    a. Duplex 1-39% bilaterally in 2016.   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   Followed as Primary Care Patient/ [facility]/ [facility]  -s/p ARDS 2006 with bacteremic S  Pna       - CT  chest 07/03/10 Nonspecific PF mostly upper lobes       - CT chest 12/03/10 acute gg changes and effusions c/w chf - PFT's  02/08/2018  FVC 0.64 (28 %)   with DLCO  78 % corrects to 147 % for alv volume      Rheumatic disease    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   SOB (shortness of breath) on exertion     Past Surgical History:  Procedure Laterality Date   ABDOMINOPLASTY     CARDIAC CATHETERIZATION N/A 06/01/2015   Procedure: Left Heart Cath and Coronary Angiography;  Surgeon: Runell Gess, MD;  Location: [facility] INVASIVE CV LAB;  Service: Cardiovascular;  Laterality: N/A;   CARDIOVERSION N/A 02/12/2021   Procedure: CARDIOVERSION;  Surgeon: Meriam Sprague, MD;  Location: [facility] ENDOSCOPY;  Service: Cardiovascular;  Laterality: N/A;   CARPAL TUNNEL RELEASE     CHOLECYSTECTOMY     CORONARY ARTERY BYPASS GRAFT N/A 06/04/2015   Procedure: CORONARY ARTERY BYPASS GRAFT times three            with left internal mammary artery and right leg saphenous vein;  Surgeon: Alleen Borne, MD;  Location: [facility] OR;  Service: Open Heart Surgery;  Laterality: N/A;   cosmetic breast surgery     JOINT REPLACEMENT     KNEE ARTHROSCOPY Left    TEE WITHOUT CARDIOVERSION  06/04/2015   Procedure: TRANSESOPHAGEAL ECHOCARDIOGRAM (TEE);  Surgeon: Alleen Borne, MD;  Location: [facility] OR;  Service: Open Heart Surgery;;   TEE WITHOUT CARDIOVERSION N/A 02/12/2021   Procedure: TRANSESOPHAGEAL ECHOCARDIOGRAM (TEE);  Surgeon: Meriam Sprague, MD;  Location: [facility] ENDOSCOPY;  Service: Cardiovascular;  Laterality: N/A;   TOTAL KNEE ARTHROPLASTY Left    TOTAL KNEE ARTHROPLASTY Right 06/06/2013   Procedure: RIGHT TOTAL KNEE ARTHROPLASTY;  Surgeon: Loanne Drilling, MD;  Location: [facility] ORS;  Service: Orthopedics;  Laterality: Right;    Current Medications: Current Meds  Medication Sig   acetaminophen (TYLENOL) 650 MG CR tablet Take 650 mg by mouth every 8 (eight) hours.   albuterol (VENTOLIN HFA) 108 (90 Base) MCG/ACT inhaler Inhale 2 puffs into the lungs  every 4 (four)  hours as needed (shortness of breath, if you can't catch your breath).   amoxicillin (AMOXIL) 500 MG tablet Take 1,000 mg by mouth See admin instructions. Dental procedure   apixaban (ELIQUIS) 5 MG TABS tablet Take 1 tablet (5 mg total) by mouth 2 (two) times daily.   atorvastatin (LIPITOR) 40 MG tablet TAKE 1 TABLET BY MOUTH  DAILY AT 6 PM   budesonide (PULMICORT) 0.25 MG/2ML nebulizer solution One twice daily   CALCIUM PO Take 1 tablet by mouth daily.   Cholecalciferol (VITAMIN D3) 2000 UNITS TABS Take 2,000 Int'l Units by mouth daily.   diltiazem (CARDIZEM CD) 240 MG 24 hr capsule Take 1 capsule (240 mg total) by mouth daily.   dofetilide (TIKOSYN) 250 MCG capsule Take 1 capsule (250 mcg total) by mouth 2 (two) times daily.   DULoxetine (CYMBALTA) 30 MG capsule Take 1 capsule (30 mg total) by mouth daily.   formoterol (PERFOROMIST) 20 MCG/2ML nebulizer solution Take 2 mLs (20 mcg total) by nebulization 2 (two) times daily. Use in nebulizer twice daily perfectly regularly   golimumab (SIMPONI ARIA) 50 MG/4ML SOLN injection Inject 50 mg into the vein every 8 (eight) weeks.    hydrocortisone 2.5 % cream Apply 1 application topically 2 (two) times daily as needed (itchiness).   ketoconazole (NIZORAL) 2 % cream Apply 1 application topically daily as needed for irritation.   leflunomide (ARAVA) 20 MG tablet Take 1 tablet (20 mg total) by mouth daily.   levothyroxine (SYNTHROID) 125 MCG tablet TAKE 1 TABLET BY MOUTH EVERY DAY   losartan (COZAAR) 50 MG tablet Take 1 tablet (50 mg total) by mouth daily.   montelukast (SINGULAIR) 10 MG tablet Take 1 tablet by mouth daily.   nitroGLYCERIN (NITROSTAT) 0.4 MG SL tablet Place 1 tablet (0.4 mg total) under the tongue every 5 (five) minutes as needed for chest pain.   nystatin (MYCOSTATIN/NYSTOP) powder APPLY 1 APPLICATION        TOPICALLY 3 TIMES A DAY   oxyCODONE (OXY IR/ROXICODONE) 5 MG immediate release tablet Take 10 mg by mouth in the  morning and at bedtime.   OXYGEN Inhale 2 L into the lungs continuous. continuous o2   pantoprazole (PROTONIX) 40 MG tablet Take 1 tablet (40 mg total) by mouth daily before breakfast.   pregabalin (LYRICA) 50 MG capsule Take 50 mg by mouth at bedtime.   torsemide (DEMADEX) 20 MG tablet Take 1 tablet (20 mg total) by mouth daily.   traMADol (ULTRAM) 50 MG tablet Take 1 tablet (50 mg total) by mouth 2 (two) times daily.   triamcinolone cream (KENALOG) 0.1 % Apply 1 application topically 2 (two) times daily as needed (for psoriasis).    umeclidinium bromide (INCRUSE ELLIPTA) 62.5 MCG/INH AEPB Inhale 1 puff into the lungs daily.   vitamin B-12 (CYANOCOBALAMIN) 1000 MCG tablet Take 1,000 mcg by mouth daily.   [DISCONTINUED] cyclobenzaprine (FLEXERIL) 5 MG tablet TAKE 1 TABLET BY MOUTH AT  BEDTIME AS NEEDED FOR  MUSCLE SPASM(S) (Patient taking differently: TAKE 1 TABLET BY MOUTH AT  BEDTIME AS NEEDED FOR  MUSCLE SPASM(S))   [DISCONTINUED] HYDROcodone-acetaminophen (NORCO) 10-325 MG tablet Take 1 tablet by mouth 3 (three) times daily as needed.     Allergies:   Gabapentin   Social History   Socioeconomic History   Marital status: Widowed    Spouse name: Not on file   Number of children: Not on file   Years of education: Not on file   Highest education level:  Not on file  Occupational History   Occupation: retired    Associate Professor: [employer]  Tobacco Use   Smoking status: Former    Packs/day: 1.50    Years: 58.50    Pack years: 87.75    Types: Cigarettes    Quit date: 03/03/2005    Years since quitting: 16.2   Smokeless tobacco: Never  Vaping Use   Vaping Use: Never used  Substance and Sexual Activity   Alcohol use: No   Drug use: No   Sexual activity: Not on file  Other Topics Concern   Not on file  Social History Narrative   Not on file   Social Determinants of Health   Financial Resource Strain: Medium Risk   Difficulty of Paying Living Expenses: Somewhat hard  Food  Insecurity: Not on file  Transportation Needs: No Transportation Needs   Lack of Transportation (Medical): No   Lack of Transportation (Non-Medical): No  Physical Activity: Not on file  Stress: Not on file  Social Connections: Not on file    Social:  I see her and her daughter frequently, she has a former husband cared for by Dr. Delton See,   Family History: The patient's family history includes Breast cancer in her mother; Coronary artery disease in her father; Rheum arthritis in her father.  ROS:   Please see the history of present illness.     All other systems reviewed and are negative.  EKGs/Labs/Other Studies Reviewed:    The following studies were reviewed today:  EKG:   05/24/21: SR 1st HB rate 78 QTc 460 01/07/21: SR rate 74 RAE  Cardiac Event Monitoring: Date:12/24/20 Results: Patient had a minimum heart rate of 65 bpm, maximum heart rate of 214 bpm, and average heart rate of 104 bpm. Predominant underlying rhythm was sinus rhythm with 1st heart block. One run of non-sustained ventricular tachycardia occurred lasting 6 beats at longest with a max rate of 214 bpm at fastest. Isolated PACs were occasional (3.6%), with rare couplets and triplets present. Isolated PVCs were rare (<1.0%), with rare couplets and trigeminy present. Atrial Tachycardia vs Atrial Flutter occurred (37% burden), ranging from 65-198 bpm (avg of 133 bpm), the longest lasting 2 days 16 hours with an avg rate of 135 bpm. Present at activation and de-activation of device. Triggered and diary events associated with sinus rhythm, atrial tachycardia, atrial flutter, and PVCs.   Likely both atrial tachycardia and atrial flutter is present.   Transthoracic Echocardiogram: Date: 12/27/20 Results: 1. Left ventricular ejection fraction, by estimation, is 60 to 65%. The  left ventricle has normal function. The left ventricle has no regional  wall motion abnormalities. There is severe left ventricular  hypertrophy.  Left ventricular diastolic parameters   are indeterminate.   2. Right ventricular systolic function is normal. The right ventricular  size is normal.   3. Left atrial size was moderately dilated.   4. The mitral valve is normal in structure. No evidence of mitral valve  regurgitation. No evidence of mitral stenosis. Moderate mitral annular  calcification.   5. The aortic valve is normal in structure. Aortic valve regurgitation is  not visualized. No aortic stenosis is present.   6. The inferior vena cava is dilated in size with <50% respiratory  variability, suggesting right atrial pressure of 15 mmHg.   Left/Right Heart Catheterizations: Date:06/01/2015 Results: Prox LAD to Mid LAD lesion, 75% stenosed. Prox Cx lesion, 95% stenosed. Prox RCA to Mid RCA lesion, 90% stenosed. The left ventricular systolic  function is normal.    Recent Labs: 12/27/2020: B Natriuretic Peptide 329.1 12/28/2020: TSH 0.752 03/28/2021: Magnesium 1.8 04/22/2021: ALT 13; BUN 33; Creatinine, Ser 1.34; Hemoglobin 11.4; Platelets 281; Potassium 4.6; Sodium 142  Recent Lipid Panel    Component Value Date/Time   CHOL 164 01/18/2020 1110   CHOL 117 07/12/2015 1508   TRIG 117 01/18/2020 1110   TRIG 75 07/12/2015 1508   HDL 56 01/18/2020 1110   HDL 51 07/12/2015 1508   CHOLHDL 2.9 01/18/2020 1110   CHOLHDL 2.3 08/25/2018 2019   VLDL 22 08/25/2018 2019   LDLCALC 87 01/18/2020 1110   LDLCALC 51 07/12/2015 1508   LDLDIRECT 131.1 08/10/2012 0829    Risk Assessment/Calculations:    CHA2DS2-VASc Score = 7  This indicates a 11.2% annual risk of stroke. The patient's score is based upon: CHF History: Yes HTN History: Yes Diabetes History: No Stroke History: Yes Vascular Disease History: Yes Age Score: 1 Gender Score: 1      Physical Exam:    VS:  BP 108/68   Pulse 78   Wt 234 lb 3.2 oz (106.2 kg) Comment: wheelchair  SpO2 97%   BMI 47.30 kg/m     Wt Readings from Last 3  Encounters:  05/24/21 234 lb 3.2 oz (106.2 kg)  04/22/21 200 lb (90.7 kg)  04/16/21 202 lb (91.6 kg)    GEN: Obese female well developed in no acute distress HEENT: Frank's Signs NECK: No JVD LYMPHATICS: No lymphadenopathy CARDIAC: RRR, no murmurs, rubs, gallops RESPIRATORY:  Good air movement and expiratory wheezes ABDOMEN: Soft, non-tender, non-distended MUSCULOSKELETAL:  No edema; No deformity  SKIN: Warm and dry NEUROLOGIC:  Alert and oriented x 3, resting tremor both arms and legs PSYCHIATRIC:  Normal affect   ASSESSMENT:    1. Paroxysmal atrial fibrillation (HCC)   2. Chronic heart failure with preserved ejection fraction (HCC)   3. Chronic obstructive pulmonary disease, unspecified COPD type (HCC)   4. S/P CABG x 3   5. PULMONARY FIBROSIS ILD POST INFLAMMATORY CHRONIC     PLAN:     Coronary Artery Disease; Obstructive s/p CABG COPD - with occasional CP - anatomy: CABGx3 (LIMA-LAD, SVG-OM1, SVG-RCA) ~ 2016 - on Eliquis and no ASA - continue statin, goal LDL < 70 ; will recheck at next visit if not done with PCP - NO BB b/c of COPD - no Ranexa for Qtc - starting PRN nitro; if worsening would need repeat LHC as no other good stress modality for her comorbidity  Paroxysmal Atrial Flutter - CHADSVASC=7. - Continue anticoagulation with Eliquis; at age 56 may meet decreased dose critereia - Continue rate control with diltiazem 240 mg  - Rhythm control options limited by pulmonary disease - Seeing Dr. Ladona Ridgel and s/p Tikosyn load - QTc 460 and stable - discussed medications at Qtc monitoring  Heart Failure Preserved Ejection Fraction  Morbid Obesity COPD HTN CKD IIIb - NYHA class II, Stage III, euvolemic, etiology multifactorial - Diuretic regimen: Torsemide 20 mg Po Daily  Winter follow up unless new symptoms or abnormal test results warranting change in plan    Medication Adjustments/Labs and Tests Ordered: Current medicines are reviewed at length with the  patient today.  Concerns regarding medicines are outlined above.  Orders Placed This Encounter  Procedures   EKG 12-Lead    Meds ordered this encounter  Medications   nitroGLYCERIN (NITROSTAT) 0.4 MG SL tablet    Sig: Place 1 tablet (0.4 mg total) under the tongue every  5 (five) minutes as needed for chest pain.    Dispense:  30 tablet    Refill:  3     Patient Instructions  Medication Instructions:  Your physician has recommended you make the following change in your medication:   START: nitroglycerin 0.4 mg under your tongue as needed for chest pain Put 1 tablet under your tongue wait 5 minutes if pain continues Put 2nd tablet under your tongue wait 5 minutes if pain continues Put a 3rd tablet under your tongue wait 5 minutes if pain continues CALL 911  *If you need a refill on your cardiac medications before your next appointment, please call your pharmacy*   Lab Work: NONE If you have labs (blood work) drawn today and your tests are completely normal, you will receive your results only by: MyChart Message (if you have MyChart) OR A paper copy in the mail If you have any lab test that is abnormal or we need to change your treatment, we will call you to review the results.   Testing/Procedures: NONE   Follow-Up: At [facility], you and your health needs are our priority.  As part of our continuing mission to provide you with exceptional heart care, we have created designated Provider Care Teams.  These Care Teams include your primary Cardiologist (physician) and Advanced Practice Providers (APPs -  Physician Assistants and Nurse Practitioners) who all work together to provide you with the care you need, when you need it.  Your next appointment:   6 month(s)  The format for your next appointment:   In Person  Provider:   You may see Riley Lam, MD or one of the following Advanced Practice Providers on your designated Care Team:   Ronie Spies,  PA-C Jacolyn Reedy, PA-C   Other Instructions Please call the office if Chest Pain is persistent you may need a heart cath.    Signed, Christell Constant, MD  05/24/2021 11:05 AM    [facility]

## 2021-05-24 ENCOUNTER — Encounter: Payer: Self-pay | Admitting: Internal Medicine

## 2021-05-24 ENCOUNTER — Ambulatory Visit (INDEPENDENT_AMBULATORY_CARE_PROVIDER_SITE_OTHER): Payer: Medicare Other | Admitting: Internal Medicine

## 2021-05-24 ENCOUNTER — Other Ambulatory Visit: Payer: Self-pay

## 2021-05-24 VITALS — BP 108/68 | HR 78 | Wt 234.2 lb

## 2021-05-24 DIAGNOSIS — I5032 Chronic diastolic (congestive) heart failure: Secondary | ICD-10-CM | POA: Diagnosis not present

## 2021-05-24 DIAGNOSIS — J8410 Pulmonary fibrosis, unspecified: Secondary | ICD-10-CM | POA: Diagnosis not present

## 2021-05-24 DIAGNOSIS — Z951 Presence of aortocoronary bypass graft: Secondary | ICD-10-CM

## 2021-05-24 DIAGNOSIS — J449 Chronic obstructive pulmonary disease, unspecified: Secondary | ICD-10-CM

## 2021-05-24 DIAGNOSIS — I480 Paroxysmal atrial fibrillation: Secondary | ICD-10-CM

## 2021-05-24 MED ORDER — NITROGLYCERIN 0.4 MG SL SUBL: 0.4000 mg | SUBLINGUAL_TABLET | SUBLINGUAL | 3 refills | Status: DC | PRN

## 2021-05-24 NOTE — Patient Instructions (Signed)
Medication Instructions:  Your physician has recommended you make the following change in your medication:   START: nitroglycerin 0.4 mg under your tongue as needed for chest pain Put 1 tablet under your tongue wait 5 minutes if pain continues Put 2nd tablet under your tongue wait 5 minutes if pain continues Put a 3rd tablet under your tongue wait 5 minutes if pain continues CALL 911  *If you need a refill on your cardiac medications before your next appointment, please call your pharmacy*   Lab Work: NONE If you have labs (blood work) drawn today and your tests are completely normal, you will receive your results only by: MyChart Message (if you have MyChart) OR A paper copy in the mail If you have any lab test that is abnormal or we need to change your treatment, we will call you to review the results.   Testing/Procedures: NONE   Follow-Up: At [facility], you and your health needs are our priority.  As part of our continuing mission to provide you with exceptional heart care, we have created designated Provider Care Teams.  These Care Teams include your primary Cardiologist (physician) and Advanced Practice Providers (APPs -  Physician Assistants and Nurse Practitioners) who all work together to provide you with the care you need, when you need it.  Your next appointment:   6 month(s)  The format for your next appointment:   In Person  Provider:   You may see Riley Lam, MD or one of the following Advanced Practice Providers on your designated Care Team:   Ronie Spies, PA-C Jacolyn Reedy, PA-C   Other Instructions Please call the office if Chest Pain is persistent you may need a heart cath.

## 2021-05-26 ENCOUNTER — Other Ambulatory Visit (HOSPITAL_COMMUNITY): Payer: Self-pay | Admitting: Physician Assistant

## 2021-05-27 DIAGNOSIS — M25552 Pain in left hip: Secondary | ICD-10-CM | POA: Diagnosis not present

## 2021-05-27 NOTE — Telephone Encounter (Signed)
Pt last saw Dr Izora Ribas 05/24/21, last labs 04/22/21 Creat 1.34, age 75, weight 106.2kg, based on specified criteria pt is on appropriate dosage of Eliquis 5mg  BID.  Will refill rx.

## 2021-05-28 DIAGNOSIS — Z993 Dependence on wheelchair: Secondary | ICD-10-CM | POA: Diagnosis not present

## 2021-05-28 DIAGNOSIS — Z7951 Long term (current) use of inhaled steroids: Secondary | ICD-10-CM | POA: Diagnosis not present

## 2021-05-28 DIAGNOSIS — R32 Unspecified urinary incontinence: Secondary | ICD-10-CM | POA: Diagnosis not present

## 2021-05-28 DIAGNOSIS — M5430 Sciatica, unspecified side: Secondary | ICD-10-CM | POA: Diagnosis not present

## 2021-05-28 DIAGNOSIS — M5416 Radiculopathy, lumbar region: Secondary | ICD-10-CM | POA: Diagnosis not present

## 2021-05-28 DIAGNOSIS — Z951 Presence of aortocoronary bypass graft: Secondary | ICD-10-CM | POA: Diagnosis not present

## 2021-05-28 DIAGNOSIS — L409 Psoriasis, unspecified: Secondary | ICD-10-CM | POA: Diagnosis not present

## 2021-05-28 DIAGNOSIS — I10 Essential (primary) hypertension: Secondary | ICD-10-CM | POA: Diagnosis not present

## 2021-05-28 DIAGNOSIS — J8410 Pulmonary fibrosis, unspecified: Secondary | ICD-10-CM | POA: Diagnosis not present

## 2021-05-28 DIAGNOSIS — F32A Depression, unspecified: Secondary | ICD-10-CM | POA: Diagnosis not present

## 2021-05-28 DIAGNOSIS — M25552 Pain in left hip: Secondary | ICD-10-CM | POA: Diagnosis not present

## 2021-05-28 DIAGNOSIS — J449 Chronic obstructive pulmonary disease, unspecified: Secondary | ICD-10-CM | POA: Diagnosis not present

## 2021-05-28 DIAGNOSIS — Z9981 Dependence on supplemental oxygen: Secondary | ICD-10-CM | POA: Diagnosis not present

## 2021-05-28 DIAGNOSIS — E039 Hypothyroidism, unspecified: Secondary | ICD-10-CM | POA: Diagnosis not present

## 2021-05-28 DIAGNOSIS — Z7901 Long term (current) use of anticoagulants: Secondary | ICD-10-CM | POA: Diagnosis not present

## 2021-05-28 DIAGNOSIS — M069 Rheumatoid arthritis, unspecified: Secondary | ICD-10-CM | POA: Diagnosis not present

## 2021-05-28 DIAGNOSIS — I2510 Atherosclerotic heart disease of native coronary artery without angina pectoris: Secondary | ICD-10-CM | POA: Diagnosis not present

## 2021-05-28 DIAGNOSIS — Z87891 Personal history of nicotine dependence: Secondary | ICD-10-CM | POA: Diagnosis not present

## 2021-05-28 DIAGNOSIS — M48061 Spinal stenosis, lumbar region without neurogenic claudication: Secondary | ICD-10-CM | POA: Diagnosis not present

## 2021-05-28 DIAGNOSIS — Z8673 Personal history of transient ischemic attack (TIA), and cerebral infarction without residual deficits: Secondary | ICD-10-CM | POA: Diagnosis not present

## 2021-05-28 NOTE — Telephone Encounter (Signed)
Spoke with Cala Bradford and orders were placed by a different provider.  Nothing further is needed.

## 2021-05-29 ENCOUNTER — Encounter: Payer: Self-pay | Admitting: Family Medicine

## 2021-05-29 ENCOUNTER — Ambulatory Visit (INDEPENDENT_AMBULATORY_CARE_PROVIDER_SITE_OTHER): Payer: Medicare Other | Admitting: Family Medicine

## 2021-05-29 ENCOUNTER — Other Ambulatory Visit: Payer: Self-pay

## 2021-05-29 ENCOUNTER — Telehealth: Payer: Self-pay

## 2021-05-29 VITALS — BP 120/80 | HR 87 | Resp 16 | Ht 59.0 in

## 2021-05-29 DIAGNOSIS — L304 Erythema intertrigo: Secondary | ICD-10-CM

## 2021-05-29 DIAGNOSIS — N649 Disorder of breast, unspecified: Secondary | ICD-10-CM | POA: Diagnosis not present

## 2021-05-29 DIAGNOSIS — L988 Other specified disorders of the skin and subcutaneous tissue: Secondary | ICD-10-CM

## 2021-05-29 DIAGNOSIS — R350 Frequency of micturition: Secondary | ICD-10-CM

## 2021-05-29 MED ORDER — NYSTATIN 100000 UNIT/GM EX POWD: 1.0000 "application " | Freq: Three times a day (TID) | CUTANEOUS | 0 refills | Status: DC

## 2021-05-29 MED ORDER — NYSTATIN-TRIAMCINOLONE 100000-0.1 UNIT/GM-% EX CREA: 1.0000 "application " | TOPICAL_CREAM | Freq: Two times a day (BID) | CUTANEOUS | 0 refills | Status: DC | PRN

## 2021-05-29 MED ORDER — CEPHALEXIN 500 MG PO CAPS: 500.0000 mg | ORAL_CAPSULE | Freq: Two times a day (BID) | ORAL | 0 refills | Status: AC

## 2021-05-29 MED ORDER — CLOTRIMAZOLE-BETAMETHASONE 1-0.05 % EX CREA: 1.0000 "application " | TOPICAL_CREAM | Freq: Two times a day (BID) | CUTANEOUS | 0 refills | Status: DC | PRN

## 2021-05-29 NOTE — Progress Notes (Signed)
ACUTE VISIT Chief Complaint  Patient presents with   Urinary Frequency   Rash    Left breast and perineal area.   HPI: [name] is a 75 y.o. female with history of hypertension, COPD on supplemental O2, OA,CVA, urine incontinence,and atrial fibrillation here today with her daughter complaining of urinary symptoms that started about 10 days ago.  Urinary Frequency  This is a new problem. The problem has been unchanged. There has been no fever. Associated symptoms include frequency and nausea. Pertinent negatives include no chills, discharge, flank pain, hematuria or vomiting. She has tried nothing for the symptoms.  Dark and "horrible" odor reported by her daughter. Negative for associated fever,chills,changes in appetite,dysuria, gross hematuria, vaginal discharge, or vaginal bleeding.  Nausea "all the time", this is not a new problem. She takes Protonix 40 mg daily for GERD.  Urinary incontinence, she wears diapers. Pruritic, erythematous rash on perineum and on groin area. She has had this problem for a while. Exacerbated by wet diapers. She has not identified alleviating factors. She has not used OTC medications. Probably has been stable. Daughter is putting topical gel to prevent contact with wet diapers.  "Spot" and intermittent "red bump" on left breast, which she has had for several years.Usually it does "pop" and improves after she drains it. It forms a dry crust, not completely healed. She attributes problem to constant irritation with of area by rubbing skin + sweat. No identified alleviating factors. Negative for associated fever, night sweats, abnormal weight loss.  She has not noted nipple discharge. Mammogram on 08/06/2020 Bi-Rads 1.  Review of Systems  Constitutional:  Positive for fatigue. Negative for appetite change, chills and fever.  HENT:  Negative for mouth sores and sore throat.   Respiratory:  Negative for cough, shortness of breath and  wheezing.   Gastrointestinal:  Positive for nausea. Negative for abdominal pain and vomiting.       Negative for changes in bowel habits.  Genitourinary:  Positive for frequency. Negative for decreased urine volume, flank pain, genital sores, hematuria and pelvic pain.  Musculoskeletal:  Positive for arthralgias, back pain and gait problem.  Skin:  Positive for rash.  Neurological:  Negative for syncope and weakness.  Rest see pertinent positives and negatives per HPI.  Current Outpatient Medications on File Prior to Visit  Medication Sig Dispense Refill   acetaminophen (TYLENOL) 650 MG CR tablet Take 650 mg by mouth every 8 (eight) hours.     albuterol (VENTOLIN HFA) 108 (90 Base) MCG/ACT inhaler Inhale 2 puffs into the lungs every 4 (four) hours as needed (shortness of breath, if you can't catch your breath). 8 g 5   amoxicillin (AMOXIL) 500 MG tablet Take 1,000 mg by mouth See admin instructions. Dental procedure     apixaban (ELIQUIS) 5 MG TABS tablet TAKE 1 TABLET BY MOUTH TWICE A DAY 60 tablet 6   atorvastatin (LIPITOR) 40 MG tablet TAKE 1 TABLET BY MOUTH  DAILY AT 6 PM 90 tablet 3   budesonide (PULMICORT) 0.25 MG/2ML nebulizer solution One twice daily 120 mL 12   CALCIUM PO Take 1 tablet by mouth daily.     Cholecalciferol (VITAMIN D3) 2000 UNITS TABS Take 2,000 Int'l Units by mouth daily.     diltiazem (CARDIZEM CD) 240 MG 24 hr capsule Take 1 capsule (240 mg total) by mouth daily. 90 capsule 1   dofetilide (TIKOSYN) 250 MCG capsule Take 1 capsule (250 mcg total) by mouth 2 (two) times  daily. 60 capsule 3   DULoxetine (CYMBALTA) 30 MG capsule Take 1 capsule (30 mg total) by mouth daily. 90 capsule 1   formoterol (PERFOROMIST) 20 MCG/2ML nebulizer solution Take 2 mLs (20 mcg total) by nebulization 2 (two) times daily. Use in nebulizer twice daily perfectly regularly 120 mL 11   golimumab (SIMPONI ARIA) 50 MG/4ML SOLN injection Inject 50 mg into the vein every 8 (eight) weeks.       ketoconazole (NIZORAL) 2 % cream Apply 1 application topically daily as needed for irritation.     leflunomide (ARAVA) 20 MG tablet Take 1 tablet (20 mg total) by mouth daily.     levothyroxine (SYNTHROID) 125 MCG tablet TAKE 1 TABLET BY MOUTH EVERY DAY 90 tablet 1   losartan (COZAAR) 50 MG tablet Take 1 tablet (50 mg total) by mouth daily. 90 tablet 3   montelukast (SINGULAIR) 10 MG tablet Take 1 tablet by mouth daily.     nitroGLYCERIN (NITROSTAT) 0.4 MG SL tablet Place 1 tablet (0.4 mg total) under the tongue every 5 (five) minutes as needed for chest pain. 30 tablet 3   nystatin (MYCOSTATIN/NYSTOP) powder APPLY 1 APPLICATION        TOPICALLY 3 TIMES A DAY 15 g 0   oxyCODONE (OXY IR/ROXICODONE) 5 MG immediate release tablet Take 10 mg by mouth in the morning and at bedtime.     OXYGEN Inhale 2 L into the lungs continuous. continuous o2     pantoprazole (PROTONIX) 40 MG tablet Take 1 tablet (40 mg total) by mouth daily before breakfast. 90 tablet 3   pregabalin (LYRICA) 50 MG capsule Take 50 mg by mouth at bedtime.     torsemide (DEMADEX) 20 MG tablet Take 1 tablet (20 mg total) by mouth daily. 90 tablet 3   traMADol (ULTRAM) 50 MG tablet Take 1 tablet (50 mg total) by mouth 2 (two) times daily. 60 tablet 1   triamcinolone cream (KENALOG) 0.1 % Apply 1 application topically 2 (two) times daily as needed (for psoriasis).      umeclidinium bromide (INCRUSE ELLIPTA) 62.5 MCG/INH AEPB Inhale 1 puff into the lungs daily. 3 each 2   vitamin B-12 (CYANOCOBALAMIN) 1000 MCG tablet Take 1,000 mcg by mouth daily.     ferrous sulfate 325 (65 FE) MG tablet Take 1 tablet (325 mg total) by mouth daily with breakfast. 30 tablet 0   No current facility-administered medications on file prior to visit.   Past Medical History:  Diagnosis Date   Arthritis    OA RIGHT KNEE WITH PAIN   Barrett esophagus    Bradycardia 06/01/2015   CAD in native artery    a. NSTEMI 05/2015 s/p emergent CABG.   Chronic diastolic  (congestive) heart failure (HCC)    Chronic kidney disease, stage 3a (HCC)    Chronic respiratory failure (HCC)    Chronic respiratory failure with hypoxia and hypercapnia (HCC) 02/04/2010   Followed in Pulmonary clinic/ [facility]/ [facility]       - 02 dependent  since 07/02/10 >>  83% RA December 05, 2010       - ONO RA 08/05/12  :  Positive sat < 89 x 2:12m> repeat on 2lpm rec 08/12/2012  - 06/17/2013 reported desat with activity p Knee surgery > rec restart 2lpm with activity  - 06/27/2013   Walked 2lpm  x one lap @ 185 stopped due to sat 88% not sob , desat to 82% on RA just at th  COPD III spirometry if use FEV1/VC p saba  07/18/2010   Quit smoking May 2006       - PFT's  04/12/10 FEV1  1.21 (69%) ratio 77 and no change p B2,  DLC0 56%   VC 70%         - PFTs  08/08/2013 FEV1 1.21 (60%) ratio 86 and no change p B2 DLCO 79%  VC 72%  On symbicort 160 2bid  - PFT's  02/08/2018  FEV1 0.70 (40 % ) ratio 56 if use FEV1/VC  p 38 % improvement from saba p symb 160 prior to study with DLCO  78 % corrects to 147  % for alv volume   - 02/08/2018   Cough variant asthma 02/26/2011   Followed in Pulmonary clinic/ [facility]/ [facility]  - PFT's  06/04/15  FEV1 1.20 (67 % ) ratio 83  p 6 % improvement from saba with DLCO  80 % corrects to 132 % for alv volume      - Clinical dx based on response to symbicort       FENO 09/16/2016  =   96 on symbicort 160 2bid > added singulair  Allergy profile 09/16/2016 >  Eos 0.5 /  IgE  78 neg RAST  -  Referred to rehab 04/29/2017 > completed   Essential hypertension 04/20/2007   Qualifier: Diagnosis of  By: Marcelyn Ditty, RN, Katy Fitch    GERD (gastroesophageal reflux disease)    History of ARDS 2006   History of home oxygen therapy    AT NIGHT WHEN SLEEPING 2 L / MIN NASAL CANNULA   Hyperlipidemia 07/12/2015   Hypothyroidism    Intracranial hemorrhage (HCC) 2019   a. small intracranial hemorrhage in setting of HTN.   Mild carotid artery disease (HCC)    a. Duplex 1-39% bilaterally in 2016.    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   Followed as Primary Care Patient/ [facility]/ [facility]  -s/p ARDS 2006 with bacteremic S  Pna       - CT chest 07/03/10 Nonspecific PF mostly upper lobes       - CT chest 12/03/10 acute gg changes and effusions c/w chf - PFT's  02/08/2018  FVC 0.64 (28 %)   with DLCO  78 % corrects to 147 % for alv volume      Rheumatic disease    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   SOB (shortness of breath) on exertion    Allergies  Allergen Reactions   Gabapentin Other (See Comments)    Dizziness, lighthead   Social History   Socioeconomic History   Marital status: Widowed    Spouse name: Not on file   Number of children: Not on file   Years of education: Not on file   Highest education level: Not on file  Occupational History   Occupation: retired    Associate Professor: [employer]  Tobacco Use   Smoking status: Former    Packs/day: 1.50    Years: 58.50    Pack years: 87.75    Types: Cigarettes    Quit date: 03/03/2005    Years since quitting: 16.2   Smokeless tobacco: Never  Vaping Use  Vaping Use: Never used  Substance and Sexual Activity   Alcohol use: No   Drug use: No   Sexual activity: Not on file  Other Topics Concern   Not on file  Social History Narrative   Not on file   Social Determinants of Health   Financial Resource Strain: Medium Risk   Difficulty of Paying Living Expenses: Somewhat hard  Food Insecurity: Not on file  Transportation Needs: No Transportation Needs   Lack of Transportation (Medical): No   Lack of Transportation (Non-Medical): No  Physical Activity: Not on file  Stress: Not on file  Social Connections: Not on file   Vitals:    05/29/21 1201  BP: 120/80  Pulse: 87  Resp: 16   Body mass index is 47.3 kg/m.  Physical Exam Vitals and nursing note reviewed.  Constitutional:      General: She is not in acute distress.    Appearance: She is well-developed.  HENT:     Head: Normocephalic and atraumatic.  Eyes:     Conjunctiva/sclera: Conjunctivae normal.  Cardiovascular:     Rate and Rhythm: Normal rate and regular rhythm.     Heart sounds: Murmur (SEM I-II/VI RUSB and LUSB) heard.  Pulmonary:     Effort: Pulmonary effort is normal. No respiratory distress.     Breath sounds: Normal breath sounds.  Chest:    Abdominal:     Palpations: Abdomen is soft.     Tenderness: There is no abdominal tenderness.  Musculoskeletal:     Right lower leg: No edema.     Left lower leg: No edema.  Skin:    General: Skin is warm.     Findings: Rash present. No erythema. Rash is nodular, pustular and vesicular.       Neurological:     Mental Status: She is alert and oriented to person, place, and time.     Comments: She is not ambulatory, in a wheel chair.   ASSESSMENT AND PLAN:  Ms.Koralynn was seen today for urinary frequency and rash.  Diagnoses and all orders for this visit: Orders Placed This Encounter  Procedures   Culture, Urine   Urinalysis with Reflex Microscopic   Skin lesion of breast Recurrent for years and not healing completely. We discussed possible etiologies, malignancy needs to be considered.  She prefers to hold on dermatology evaluation to discuss biopsy. Short course of antibiotics may help.  -     cephALEXin (KEFLEX) 500 MG capsule; Take 1 capsule (500 mg total) by mouth 2 (two) times daily for 7 days.  Urine frequency We discussed possible etiologies. She could not tolerate sample here in the office, so daughter will bring it when she is able to do so, ideally before starting antibiotic treatment. Empiric treatment with Keflex 500 mg twice daily x7 days. Monitor for new  symptoms. Instructed about warning signs. We will follow Ucx.  Intertrigo Educated about diagnosis, prognosis, and treatment options. Try to keep area dry, nystatin powder may help. Antibacterial soap. Lotrisone cream, small amount, twice daily as needed.  -     nystatin (MYCOSTATIN/NYSTOP) powder; Apply 1 application topically 3 (three) times daily. -     clotrimazole-betamethasone (LOTRISONE) cream; Apply 1 application topically 2 (two) times daily as needed.  I spent a total of 41 minutes in both face to face and non face to face activities for this visit on the date of this encounter. During this time history was obtained and documented, examination was performed, prior labs/imaging  reviewed, and assessment/plan discussed.  Return in about 2 weeks (around 06/12/2021) for skin lesion,diaper rash,and urine sx with PCP.Marland Kitchen   Jese Comella G. [place], MD  [facility]. [place] office.

## 2021-05-29 NOTE — Telephone Encounter (Signed)
Pharmacy called in about a possible interaction with pt's Tikosyn and the Cephalexin. Okay for the pharmacy to fill per Dr. [place]. Pharmacy is aware and will fill Rx for patient.

## 2021-05-29 NOTE — Patient Instructions (Signed)
A few things to remember from today's visit:   Skin lesion of breast  Urine frequency  Intertrigo - Plan: nystatin-triamcinolone (MYCOLOG II) cream, nystatin (MYCOSTATIN/NYSTOP) powder  If you need refills please call your pharmacy. Do not use My Chart to request refills or for acute issues that need immediate attention.   Keflex 500 mg 2 times daily for possible urine tract infection and to help with left breast skin lesion. Increase fluid intake. Monitor for new symptoms. Try to keep diaper and under breast areas dry, Nystatin powder may help.  Please be sure medication list is accurate. If a new problem present, please set up appointment sooner than planned today.

## 2021-05-30 LAB — URINALYSIS, ROUTINE W REFLEX MICROSCOPIC
Bilirubin Urine: NEGATIVE
Hgb urine dipstick: NEGATIVE
Ketones, ur: NEGATIVE
Leukocytes,Ua: NEGATIVE
Nitrite: NEGATIVE
RBC / HPF: NONE SEEN (ref 0–?)
Specific Gravity, Urine: 1.01 (ref 1.000–1.030)
Total Protein, Urine: NEGATIVE
Urine Glucose: NEGATIVE
Urobilinogen, UA: 0.2 (ref 0.0–1.0)
pH: 6 (ref 5.0–8.0)

## 2021-05-30 LAB — URINE CULTURE
MICRO NUMBER:: 12170599
SPECIMEN QUALITY:: ADEQUATE

## 2021-06-03 ENCOUNTER — Encounter: Payer: Self-pay | Admitting: Student

## 2021-06-03 ENCOUNTER — Other Ambulatory Visit: Payer: Self-pay

## 2021-06-03 ENCOUNTER — Ambulatory Visit (INDEPENDENT_AMBULATORY_CARE_PROVIDER_SITE_OTHER): Payer: Medicare Other | Admitting: Student

## 2021-06-03 VITALS — BP 136/68 | HR 96 | Ht 59.0 in | Wt 204.0 lb

## 2021-06-03 DIAGNOSIS — I4819 Other persistent atrial fibrillation: Secondary | ICD-10-CM | POA: Diagnosis not present

## 2021-06-03 DIAGNOSIS — I4892 Unspecified atrial flutter: Secondary | ICD-10-CM

## 2021-06-03 DIAGNOSIS — I5032 Chronic diastolic (congestive) heart failure: Secondary | ICD-10-CM | POA: Diagnosis not present

## 2021-06-03 DIAGNOSIS — Z951 Presence of aortocoronary bypass graft: Secondary | ICD-10-CM

## 2021-06-03 DIAGNOSIS — Z20822 Contact with and (suspected) exposure to covid-19: Secondary | ICD-10-CM | POA: Diagnosis not present

## 2021-06-03 NOTE — Patient Instructions (Signed)
Medication Instructions:  Your physician recommends that you continue on your current medications as directed. Please refer to the Current Medication list given to you today.  *If you need a refill on your cardiac medications before your next appointment, please call your pharmacy*   Lab Work: TODAY: BMET, Mg  If you have labs (blood work) drawn today and your tests are completely normal, you will receive your results only by: MyChart Message (if you have MyChart) OR A paper copy in the mail If you have any lab test that is abnormal or we need to change your treatment, we will call you to review the results.   Follow-Up: At [facility], you and your health needs are our priority.  As part of our continuing mission to provide you with exceptional heart care, we have created designated Provider Care Teams.  These Care Teams include your primary Cardiologist (physician) and Advanced Practice Providers (APPs -  Physician Assistants and Nurse Practitioners) who all work together to provide you with the care you need, when you need it.  Your next appointment:   6 month(s)  The format for your next appointment:   In Person  Provider:   You may see Sharrell Ku, MD or one of the following Advanced Practice Providers on your designated Care Team:   Francis Dowse, [place] "[facility]" [place], [place]

## 2021-06-03 NOTE — Progress Notes (Signed)
PCP:  [name], [name], MD Primary Cardiologist: Tobias Alexander, MD (Inactive) Electrophysiologist: Lewayne Bunting, MD   [name] is a 75 y.o. female seen today for Lewayne Bunting, MD for routine electrophysiology followup.  Since last being seen in our clinic the patient reports doing well overall.   Feels occasionally palpitations but feels like maintaining NSR for the most part. She has intermittent peripheral edema that is currently well controlled. Intermittent non-specific chest pain being followed by cards. She denies palpitations, dyspnea, PND, orthopnea, nausea, vomiting, dizziness, syncope, weight gain, or early satiety.  Past Medical History:  Diagnosis Date   Arthritis    OA RIGHT KNEE WITH PAIN   Barrett esophagus    Bradycardia 06/01/2015   CAD in native artery    a. NSTEMI 05/2015 s/p emergent CABG.   Chronic diastolic (congestive) heart failure (HCC)    Chronic kidney disease, stage 3a (HCC)    Chronic respiratory failure (HCC)    Chronic respiratory failure with hypoxia and hypercapnia (HCC) 02/04/2010   Followed in Pulmonary clinic/ [facility]       - 02 dependent  since 07/02/10 >>  83% RA December 05, 2010       - ONO RA 08/05/12  :  Positive sat < 89 x 2:77m> repeat on 2lpm rec 08/12/2012  - 06/17/2013 reported desat with activity p Knee surgery > rec restart 2lpm with activity  - 06/27/2013   Walked 2lpm  x one lap @ 185 stopped due to sat 88% not sob , desat to 82% on RA just at th   COPD III spirometry if use FEV1/VC p saba  07/18/2010   Quit smoking May 2006       - PFT's  04/12/10 FEV1  1.21 (69%) ratio 77 and no change p B2,  DLC0 56%   VC 70%         - PFTs  08/08/2013 FEV1 1.21 (60%) ratio 86 and no change p B2 DLCO 79%  VC 72%  On symbicort 160 2bid  - PFT's  02/08/2018  FEV1 0.70 (40 % ) ratio 56 if use FEV1/VC  p 38 % improvement from saba p symb 160 prior to study with DLCO  78 % corrects to 147  % for alv volume   - 02/08/2018   Cough  variant asthma 02/26/2011   Followed in Pulmonary clinic/ [facility]  - PFT's  06/04/15  FEV1 1.20 (67 % ) ratio 83  p 6 % improvement from saba with DLCO  80 % corrects to 132 % for alv volume      - Clinical dx based on response to symbicort       FENO 09/16/2016  =   96 on symbicort 160 2bid > added singulair  Allergy profile 09/16/2016 >  Eos 0.5 /  IgE  78 neg RAST  -  Referred to rehab 04/29/2017 > completed   Essential hypertension 04/20/2007   Qualifier: Diagnosis of  By: Marcelyn Ditty, RN, Katy Fitch    GERD (gastroesophageal reflux disease)    History of ARDS 2006   History of home oxygen therapy    AT NIGHT WHEN SLEEPING 2 L / MIN NASAL CANNULA   Hyperlipidemia 07/12/2015   Hypothyroidism    Intracranial hemorrhage (HCC) 2019   a. small intracranial hemorrhage in setting of HTN.   Mild carotid artery disease (HCC)    a. Duplex 1-39% bilaterally in 2016.   Morbid (severe) obesity due  to excess calories (HCC) 04/22/2015   pfts with erv 14% 06/04/15  And 33% 02/08/2018    NSTEMI (non-ST elevated myocardial infarction) (HCC) 05/31/2015   Pneumococcal pneumonia (HCC) 2006   HOSPITALIZED AND DEVELOPED ARDS   Psoriatic arthritis (HCC)    PULMONARY FIBROSIS ILD POST INFLAMMATORY CHRONIC 07/18/2010   Followed as Primary Care Patient/ [facility]  -s/p ARDS 2006 with bacteremic S  Pna       - CT chest 07/03/10 Nonspecific PF mostly upper lobes       - CT chest 12/03/10 acute gg changes and effusions c/w chf - PFT's  02/08/2018  FVC 0.64 (28 %)   with DLCO  78 % corrects to 147 % for alv volume      Rheumatic disease    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 ENDOSCOPY;  Service: Cardiovascular;  Laterality: N/A;   CARPAL TUNNEL RELEASE     CHOLECYSTECTOMY     CORONARY ARTERY BYPASS GRAFT N/A 06/04/2015   Procedure: CORONARY ARTERY BYPASS GRAFT times three            with left internal mammary artery and right leg saphenous vein;  Surgeon: Alleen Borne, MD;  Location: [facility] OR;  Service: Open Heart Surgery;  Laterality: N/A;   cosmetic breast surgery     JOINT REPLACEMENT     KNEE ARTHROSCOPY Left    TEE WITHOUT CARDIOVERSION  06/04/2015   Procedure: TRANSESOPHAGEAL ECHOCARDIOGRAM (TEE);  Surgeon: Alleen Borne, MD;  Location: [facility] OR;  Service: Open Heart Surgery;;   TEE WITHOUT CARDIOVERSION N/A 02/12/2021   Procedure: TRANSESOPHAGEAL ECHOCARDIOGRAM (TEE);  Surgeon: Meriam Sprague, MD;  Location: [facility] ENDOSCOPY;  Service: Cardiovascular;  Laterality: N/A;   TOTAL KNEE ARTHROPLASTY Left    TOTAL KNEE ARTHROPLASTY Right 06/06/2013   Procedure: RIGHT TOTAL KNEE ARTHROPLASTY;  Surgeon: Loanne Drilling, MD;  Location: [facility] ORS;  Service: Orthopedics;  Laterality: Right;    Current Outpatient Medications  Medication Sig Dispense Refill   acetaminophen (TYLENOL) 650 MG CR tablet Take 650 mg by mouth every 8 (eight) hours.     albuterol (VENTOLIN HFA) 108 (90 Base) MCG/ACT inhaler Inhale 2 puffs into the lungs every 4 (four) hours as needed (shortness of breath, if you can't catch your breath). 8 g 5   amoxicillin (AMOXIL) 500 MG tablet Take 1,000 mg by mouth See admin instructions. Dental procedure     apixaban (ELIQUIS) 5 MG TABS tablet TAKE 1 TABLET BY MOUTH TWICE A DAY 60 tablet 6   atorvastatin (LIPITOR) 40 MG tablet TAKE 1 TABLET BY MOUTH  DAILY AT 6 PM 90 tablet 3   budesonide (PULMICORT) 0.25 MG/2ML nebulizer solution One twice daily 120 mL 12   CALCIUM PO Take 1 tablet by mouth daily.     cephALEXin (KEFLEX) 500 MG capsule Take 1 capsule (500 mg total) by mouth 2 (two) times  daily for 7 days. 14 capsule 0   Cholecalciferol (VITAMIN D3) 2000 UNITS TABS Take 2,000 Int'l Units by mouth daily.     clotrimazole-betamethasone (LOTRISONE)  cream Apply 1 application topically 2 (two) times daily as needed. 45 g 0   diltiazem (CARDIZEM CD) 240 MG 24 hr capsule Take 1 capsule (240 mg total) by mouth daily. 90 capsule 1   dofetilide (TIKOSYN) 250 MCG capsule Take 1 capsule (250 mcg total) by mouth 2 (two) times daily. 60 capsule 3   DULoxetine (CYMBALTA) 30 MG capsule Take 1 capsule (30 mg total) by mouth daily. 90 capsule 1   formoterol (PERFOROMIST) 20 MCG/2ML nebulizer solution Take 2 mLs (20 mcg total) by nebulization 2 (two) times daily. Use in nebulizer twice daily perfectly regularly 120 mL 11   golimumab (SIMPONI ARIA) 50 MG/4ML SOLN injection Inject 50 mg into the vein every 8 (eight) weeks.      ketoconazole (NIZORAL) 2 % cream Apply 1 application topically daily as needed for irritation.     leflunomide (ARAVA) 20 MG tablet Take 1 tablet (20 mg total) by mouth daily.     levothyroxine (SYNTHROID) 125 MCG tablet TAKE 1 TABLET BY MOUTH EVERY DAY 90 tablet 1   losartan (COZAAR) 50 MG tablet Take 1 tablet (50 mg total) by mouth daily. 90 tablet 3   montelukast (SINGULAIR) 10 MG tablet Take 1 tablet by mouth daily.     nitroGLYCERIN (NITROSTAT) 0.4 MG SL tablet Place 1 tablet (0.4 mg total) under the tongue every 5 (five) minutes as needed for chest pain. 30 tablet 3   nystatin (MYCOSTATIN/NYSTOP) powder APPLY 1 APPLICATION        TOPICALLY 3 TIMES A DAY 15 g 0   oxyCODONE (OXY IR/ROXICODONE) 5 MG immediate release tablet Take 10 mg by mouth in the morning and at bedtime.     OXYGEN Inhale 2 L into the lungs continuous. continuous o2     pantoprazole (PROTONIX) 40 MG tablet Take 1 tablet (40 mg total) by mouth daily before breakfast. 90 tablet 3   pregabalin (LYRICA) 50 MG capsule Take 50 mg by mouth at bedtime.     torsemide (DEMADEX) 20 MG tablet Take 1 tablet (20 mg  total) by mouth daily. 90 tablet 3   traMADol (ULTRAM) 50 MG tablet Take 1 tablet (50 mg total) by mouth 2 (two) times daily. 60 tablet 1   triamcinolone cream (KENALOG) 0.1 % Apply 1 application topically 2 (two) times daily as needed (for psoriasis).      umeclidinium bromide (INCRUSE ELLIPTA) 62.5 MCG/INH AEPB Inhale 1 puff into the lungs daily. 3 each 2   vitamin B-12 (CYANOCOBALAMIN) 1000 MCG tablet Take 1,000 mcg by mouth daily.     ferrous sulfate 325 (65 FE) MG tablet Take 1 tablet (325 mg total) by mouth daily with breakfast. 30 tablet 0   No current facility-administered medications for this visit.    Allergies  Allergen Reactions   Gabapentin Other (See Comments)    Dizziness, lighthead    Social History   Socioeconomic History   Marital status: Widowed    Spouse name: Not on file   Number of children: Not on file   Years of education: Not on file   Highest education level: Not on file  Occupational History   Occupation: retired    Associate Professor: [employer]  Tobacco Use   Smoking status: Former    Packs/day: 1.50    Years: 58.50    Pack years: 87.75    Types: Cigarettes    Quit date: 03/03/2005    Years since quitting: 16.2   Smokeless tobacco: Never  Vaping Use  Vaping Use: Never used  Substance and Sexual Activity   Alcohol use: No   Drug use: No   Sexual activity: Not on file  Other Topics Concern   Not on file  Social History Narrative   Not on file   Social Determinants of Health   Financial Resource Strain: Medium Risk   Difficulty of Paying Living Expenses: Somewhat hard  Food Insecurity: Not on file  Transportation Needs: No Transportation Needs   Lack of Transportation (Medical): No   Lack of Transportation (Non-Medical): No  Physical Activity: Not on file  Stress: Not on file  Social Connections: Not on file  Intimate Partner Violence: Not on file     Review of Systems: General: No chills, fever, night sweats or weight changes   Cardiovascular:  No chest pain, dyspnea on exertion, edema, orthopnea, palpitations, paroxysmal nocturnal dyspnea Dermatological: No rash, lesions or masses Respiratory: No cough, dyspnea Urologic: No hematuria, dysuria Abdominal: No nausea, vomiting, diarrhea, bright red blood per rectum, melena, or hematemesis Neurologic: No visual changes, weakness, changes in mental status All other systems reviewed and are otherwise negative except as noted above.  Physical Exam: Vitals:   06/03/21 1215  BP: 136/68  Pulse: 96  SpO2: 94%  Weight: 204 lb (92.5 kg)  Height: 4\' 11"  (1.499 m)    GEN- The patient is well appearing, alert and oriented x 3 today.   HEENT: normocephalic, atraumatic; sclera clear, conjunctiva pink; hearing intact; oropharynx clear; neck supple, no JVP Lymph- no cervical lymphadenopathy Lungs- Clear to ausculation bilaterally, normal work of breathing.  No wheezes, rales, rhonchi Heart- Regular rate and rhythm, no murmurs, rubs or gallops, PMI not laterally displaced GI- soft, non-tender, non-distended, bowel sounds present, no hepatosplenomegaly Extremities- no clubbing, cyanosis, or edema; DP/PT/radial pulses 2+ bilaterally MS- no significant deformity or atrophy Skin- warm and dry, no rash or lesion Psych- euthymic mood, full affect Neuro- strength and sensation are intact  EKG is ordered. Personal review of EKG from today shows NSR with PACs at 97 bpm and stable QTc ~ 480 ms  Additional studies reviewed include: Previous EP and AF notes  Assessment and Plan:  1. Persistent atrial fibrillation/flutter EKG today shows NSR with PACs Continue tikosyn 250 mcg BID Continue eliquis for CHA2DS2-VASc of at least 5. BMET and Mg today.   2. CAD s/p CABG Occasional chest "tingles". Dr. following.  Given NTG last week and knows to call if worsening.  3. Chronic diastolic CHF Volume status stable on exam today.  4. UTI Starting on Keflex per PCP. No  interaction with Tikosyn that I can see  Continue q 6 month monitoring. Sooner with issues.  Izora Ribas, PA-C  06/03/21 1:01 PM

## 2021-06-04 ENCOUNTER — Telehealth: Payer: Self-pay

## 2021-06-04 ENCOUNTER — Telehealth: Payer: Self-pay | Admitting: Internal Medicine

## 2021-06-04 DIAGNOSIS — I10 Essential (primary) hypertension: Secondary | ICD-10-CM | POA: Diagnosis not present

## 2021-06-04 DIAGNOSIS — M25552 Pain in left hip: Secondary | ICD-10-CM | POA: Diagnosis not present

## 2021-06-04 DIAGNOSIS — J449 Chronic obstructive pulmonary disease, unspecified: Secondary | ICD-10-CM | POA: Diagnosis not present

## 2021-06-04 DIAGNOSIS — M5430 Sciatica, unspecified side: Secondary | ICD-10-CM | POA: Diagnosis not present

## 2021-06-04 DIAGNOSIS — M5416 Radiculopathy, lumbar region: Secondary | ICD-10-CM | POA: Diagnosis not present

## 2021-06-04 DIAGNOSIS — M48061 Spinal stenosis, lumbar region without neurogenic claudication: Secondary | ICD-10-CM | POA: Diagnosis not present

## 2021-06-04 LAB — BASIC METABOLIC PANEL
BUN/Creatinine Ratio: 20 (ref 12–28)
BUN: 20 mg/dL (ref 8–27)
CO2: 36 mmol/L — ABNORMAL HIGH (ref 20–29)
Calcium: 9.8 mg/dL (ref 8.7–10.3)
Chloride: 93 mmol/L — ABNORMAL LOW (ref 96–106)
Creatinine, Ser: 1 mg/dL (ref 0.57–1.00)
Glucose: 121 mg/dL — ABNORMAL HIGH (ref 65–99)
Potassium: 4 mmol/L (ref 3.5–5.2)
Sodium: 142 mmol/L (ref 134–144)
eGFR: 59 mL/min/{1.73_m2} — ABNORMAL LOW (ref >59)

## 2021-06-04 LAB — MAGNESIUM: Magnesium: 1.7 mg/dL (ref 1.6–2.3)

## 2021-06-04 NOTE — Telephone Encounter (Signed)
disgard

## 2021-06-05 ENCOUNTER — Encounter: Payer: Self-pay | Admitting: Internal Medicine

## 2021-06-05 ENCOUNTER — Ambulatory Visit (INDEPENDENT_AMBULATORY_CARE_PROVIDER_SITE_OTHER)
Admission: RE | Admit: 2021-06-05 | Discharge: 2021-06-05 | Disposition: A | Discharge status: Home / Self Care | Payer: Medicare Other | Source: Ambulatory Visit | Attending: Internal Medicine | Admitting: Internal Medicine

## 2021-06-05 ENCOUNTER — Ambulatory Visit (INDEPENDENT_AMBULATORY_CARE_PROVIDER_SITE_OTHER): Payer: Medicare Other | Admitting: Internal Medicine

## 2021-06-05 ENCOUNTER — Other Ambulatory Visit: Payer: Self-pay

## 2021-06-05 VITALS — BP 118/80 | HR 99 | Temp 99.3°F | Wt 200.2 lb

## 2021-06-05 DIAGNOSIS — R0602 Shortness of breath: Secondary | ICD-10-CM

## 2021-06-05 DIAGNOSIS — R600 Localized edema: Secondary | ICD-10-CM | POA: Diagnosis not present

## 2021-06-05 DIAGNOSIS — J449 Chronic obstructive pulmonary disease, unspecified: Secondary | ICD-10-CM | POA: Diagnosis not present

## 2021-06-05 DIAGNOSIS — J9611 Chronic respiratory failure with hypoxia: Secondary | ICD-10-CM

## 2021-06-05 DIAGNOSIS — J90 Pleural effusion, not elsewhere classified: Secondary | ICD-10-CM | POA: Diagnosis not present

## 2021-06-05 DIAGNOSIS — J9612 Chronic respiratory failure with hypercapnia: Secondary | ICD-10-CM

## 2021-06-05 DIAGNOSIS — J811 Chronic pulmonary edema: Secondary | ICD-10-CM | POA: Diagnosis not present

## 2021-06-05 DIAGNOSIS — I517 Cardiomegaly: Secondary | ICD-10-CM | POA: Diagnosis not present

## 2021-06-05 NOTE — Progress Notes (Addendum)
Acute office Visit     This visit occurred during the SARS-CoV-2 public health emergency.  Safety protocols were in place, including screening questions prior to the visit, additional usage of staff PPE, and extensive cleaning of exam room while observing appropriate contact time as indicated for disinfecting solutions.    CC/Reason for Visit: Edema and shortness of breath  HPI: [name] is a 75 y.o. female who is coming in today for the above mentioned reasons.  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  She was seen recently by both her cardiologist and at the [facility].  In fact she was seen at the [facility] 2 days ago.  At that time she did not have any lower extremity edema or shortness of breath.  Daughter states that a day and a half ago she started having significant edema that started around her ankles and has now progressed to her face and arms.  In addition to that patient admits to being much more short of breath than her usual.  In fact we were unable to get her O2 sats above 88% despite 3 L of oxygen.  She does not have any chest pain.  She does not feel like she needs to visit the emergency department.  Her daughter proactively gave her 2 tablets of torsemide yesterday instead of one 20 mg tablet that she routinely takes.  Past Medical/Surgical History: Past Medical History:  Diagnosis Date   Arthritis    OA RIGHT KNEE WITH PAIN   Barrett esophagus    Bradycardia 06/01/2015   CAD in native artery    a. NSTEMI 05/2015 s/p emergent CABG.   Chronic diastolic (congestive) heart failure (HCC)    Chronic kidney disease, stage 3a (HCC)    Chronic respiratory failure (HCC)    Chronic respiratory failure with hypoxia and hypercapnia (HCC) 02/04/2010   Followed in Pulmonary  clinic/ [facility] [place]       - 02 dependent  since 07/02/10 >>  83% RA December 05, 2010       - ONO RA 08/05/12  :  Positive sat < 89 x 2:73m> repeat on 2lpm rec 08/12/2012  - 06/17/2013 reported desat with activity p Knee surgery > rec restart 2lpm with activity  - 06/27/2013   Walked 2lpm  x one lap @ 185 stopped due to sat 88% not sob , desat to 82% on RA just at th   COPD III spirometry if use FEV1/VC p saba  07/18/2010   Quit smoking May 2006       - PFT's  04/12/10 FEV1  1.21 (69%) ratio 77 and no change p B2,  DLC0 56%   VC 70%         - PFTs  08/08/2013 FEV1 1.21 (60%) ratio 86 and no change p B2 DLCO 79%  VC 72%  On symbicort 160 2bid  - PFT's  02/08/2018  FEV1 0.70 (40 % ) ratio 56 if use FEV1/VC  p 38 % improvement from saba p symb 160 prior to study with DLCO  78 % corrects to 147  % for alv volume   - 02/08/2018   Cough variant asthma 02/26/2011   Followed in Pulmonary clinic/ [facility]/ [place]  - PFT's  06/04/15  FEV1 1.20 (67 % )  ratio 83  p 6 % improvement from saba with DLCO  80 % corrects to 132 % for alv volume      - Clinical dx based on response to symbicort       FENO 09/16/2016  =   96 on symbicort 160 2bid > added singulair  Allergy profile 09/16/2016 >  Eos 0.5 /  IgE  78 neg RAST  -  Referred to rehab 04/29/2017 > completed   Essential hypertension 04/20/2007   Qualifier: Diagnosis of  By: Marcelyn Ditty, RN, Katy Fitch    GERD (gastroesophageal reflux disease)    History of ARDS 2006   History of home oxygen therapy    AT NIGHT WHEN SLEEPING 2 L / MIN NASAL CANNULA   Hyperlipidemia 07/12/2015   Hypothyroidism    Intracranial hemorrhage (HCC) 2019   a. small intracranial hemorrhage in setting of HTN.   Mild carotid artery disease (HCC)    a. Duplex 1-39% bilaterally in 2016.   Morbid (severe) obesity due to excess calories (HCC) 04/22/2015   pfts with erv 14% 06/04/15  And 33% 02/08/2018    NSTEMI (non-ST elevated myocardial infarction) (HCC) 05/31/2015   Pneumococcal pneumonia (HCC)  2006   HOSPITALIZED AND DEVELOPED ARDS   Psoriatic arthritis (HCC)    PULMONARY FIBROSIS ILD POST INFLAMMATORY CHRONIC 07/18/2010   Followed as Primary Care Patient/ [facility]/ [place]  -s/p ARDS 2006 with bacteremic S  Pna       - CT chest 07/03/10 Nonspecific PF mostly upper lobes       - CT chest 12/03/10 acute gg changes and effusions c/w chf - PFT's  02/08/2018  FVC 0.64 (28 %)   with DLCO  78 % corrects to 147 % for alv volume      Rheumatic disease    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   SOB (shortness of breath) on exertion     Past Surgical History:  Procedure Laterality Date   ABDOMINOPLASTY     CARDIAC CATHETERIZATION N/A 06/01/2015   Procedure: Left Heart Cath and Coronary Angiography;  Surgeon: Runell Gess, MD;  Location: [facility] INVASIVE CV LAB;  Service: Cardiovascular;  Laterality: N/A;   CARDIOVERSION N/A 02/12/2021   Procedure: CARDIOVERSION;  Surgeon: Meriam Sprague, MD;  Location: [facility] ENDOSCOPY;  Service: Cardiovascular;  Laterality: N/A;   CARPAL TUNNEL RELEASE     CHOLECYSTECTOMY     CORONARY ARTERY BYPASS GRAFT N/A 06/04/2015   Procedure: CORONARY ARTERY BYPASS GRAFT times three            with left internal mammary artery and right leg saphenous vein;  Surgeon: Alleen Borne, MD;  Location: [facility] OR;  Service: Open Heart Surgery;  Laterality: N/A;   cosmetic breast surgery     JOINT REPLACEMENT     KNEE ARTHROSCOPY Left    TEE WITHOUT CARDIOVERSION  06/04/2015   Procedure: TRANSESOPHAGEAL ECHOCARDIOGRAM (TEE);  Surgeon: Alleen Borne, MD;  Location: [facility] OR;  Service: Open Heart Surgery;;   TEE WITHOUT CARDIOVERSION N/A 02/12/2021   Procedure: TRANSESOPHAGEAL ECHOCARDIOGRAM (TEE);  Surgeon: Meriam Sprague, MD;  Location: [facility] ENDOSCOPY;  Service: Cardiovascular;  Laterality: N/A;   TOTAL KNEE ARTHROPLASTY Left    TOTAL KNEE ARTHROPLASTY Right 06/06/2013   Procedure: RIGHT TOTAL KNEE ARTHROPLASTY;  Surgeon: Loanne Drilling, MD;  Location: [facility] ORS;  Service: Orthopedics;   Laterality: Right;    Social History:  reports that she quit smoking about 16 years ago. Her smoking use included  cigarettes. She has a 87.75 pack-year smoking history. She has never used smokeless tobacco. She reports that she does not drink alcohol and does not use drugs.  Allergies: Allergies  Allergen Reactions   Gabapentin Other (See Comments)    Dizziness, lighthead    Family History:  Family History  Problem Relation Age of Onset   Breast cancer Mother    Coronary artery disease Father    Rheum arthritis Father      Current Outpatient Medications:    acetaminophen (TYLENOL) 650 MG CR tablet, Take 650 mg by mouth every 8 (eight) hours., Disp: , Rfl:    albuterol (VENTOLIN HFA) 108 (90 Base) MCG/ACT inhaler, Inhale 2 puffs into the lungs every 4 (four) hours as needed (shortness of breath, if you can't catch your breath)., Disp: 8 g, Rfl: 5   amoxicillin (AMOXIL) 500 MG tablet, Take 1,000 mg by mouth See admin instructions. Dental procedure, Disp: , Rfl:    apixaban (ELIQUIS) 5 MG TABS tablet, TAKE 1 TABLET BY MOUTH TWICE A DAY, Disp: 60 tablet, Rfl: 6   atorvastatin (LIPITOR) 40 MG tablet, TAKE 1 TABLET BY MOUTH  DAILY AT 6 PM, Disp: 90 tablet, Rfl: 3   budesonide (PULMICORT) 0.25 MG/2ML nebulizer solution, One twice daily, Disp: 120 mL, Rfl: 12   CALCIUM PO, Take 1 tablet by mouth daily., Disp: , Rfl:    cephALEXin (KEFLEX) 500 MG capsule, Take 1 capsule (500 mg total) by mouth 2 (two) times daily for 7 days., Disp: 14 capsule, Rfl: 0   Cholecalciferol (VITAMIN D3) 2000 UNITS TABS, Take 2,000 Int'l Units by mouth daily., Disp: , Rfl:    clotrimazole-betamethasone (LOTRISONE) cream, Apply 1 application topically 2 (two) times daily as needed., Disp: 45 g, Rfl: 0   diltiazem (CARDIZEM CD) 240 MG 24 hr capsule, Take 1 capsule (240 mg total) by mouth daily., Disp: 90 capsule, Rfl: 1   dofetilide (TIKOSYN) 250 MCG capsule, Take 1 capsule (250 mcg total) by mouth 2 (two) times  daily., Disp: 60 capsule, Rfl: 3   DULoxetine (CYMBALTA) 30 MG capsule, Take 1 capsule (30 mg total) by mouth daily., Disp: 90 capsule, Rfl: 1   ferrous sulfate 325 (65 FE) MG tablet, Take 1 tablet (325 mg total) by mouth daily with breakfast., Disp: 30 tablet, Rfl: 0   formoterol (PERFOROMIST) 20 MCG/2ML nebulizer solution, Take 2 mLs (20 mcg total) by nebulization 2 (two) times daily. Use in nebulizer twice daily perfectly regularly, Disp: 120 mL, Rfl: 11   golimumab (SIMPONI ARIA) 50 MG/4ML SOLN injection, Inject 50 mg into the vein every 8 (eight) weeks. , Disp: , Rfl:    ketoconazole (NIZORAL) 2 % cream, Apply 1 application topically daily as needed for irritation., Disp: , Rfl:    leflunomide (ARAVA) 20 MG tablet, Take 1 tablet (20 mg total) by mouth daily., Disp: , Rfl:    levothyroxine (SYNTHROID) 125 MCG tablet, TAKE 1 TABLET BY MOUTH EVERY DAY, Disp: 90 tablet, Rfl: 1   losartan (COZAAR) 50 MG tablet, Take 1 tablet (50 mg total) by mouth daily., Disp: 90 tablet, Rfl: 3   montelukast (SINGULAIR) 10 MG tablet, Take 1 tablet by mouth daily., Disp: , Rfl:    nitroGLYCERIN (NITROSTAT) 0.4 MG SL tablet, Place 1 tablet (0.4 mg total) under the tongue every 5 (five) minutes as needed for chest pain., Disp: 30 tablet, Rfl: 3   nystatin (MYCOSTATIN/NYSTOP) powder, APPLY 1 APPLICATION        TOPICALLY 3 TIMES A  DAY, Disp: 15 g, Rfl: 0   oxyCODONE (OXY IR/ROXICODONE) 5 MG immediate release tablet, Take 10 mg by mouth in the morning and at bedtime., Disp: , Rfl:    OXYGEN, Inhale 2 L into the lungs continuous. continuous o2, Disp: , Rfl:    pantoprazole (PROTONIX) 40 MG tablet, Take 1 tablet (40 mg total) by mouth daily before breakfast., Disp: 90 tablet, Rfl: 3   pregabalin (LYRICA) 50 MG capsule, Take 50 mg by mouth at bedtime., Disp: , Rfl:    torsemide (DEMADEX) 20 MG tablet, Take 1 tablet (20 mg total) by mouth daily., Disp: 90 tablet, Rfl: 3   traMADol (ULTRAM) 50 MG tablet, Take 1 tablet (50 mg  total) by mouth 2 (two) times daily., Disp: 60 tablet, Rfl: 1   triamcinolone cream (KENALOG) 0.1 %, Apply 1 application topically 2 (two) times daily as needed (for psoriasis). , Disp: , Rfl:    umeclidinium bromide (INCRUSE ELLIPTA) 62.5 MCG/INH AEPB, Inhale 1 puff into the lungs daily., Disp: 3 each, Rfl: 2   vitamin B-12 (CYANOCOBALAMIN) 1000 MCG tablet, Take 1,000 mcg by mouth daily., Disp: , Rfl:   Review of Systems:  Constitutional: Denies fever, chills, diaphoresis, appetite change and fatigue.  HEENT: Denies photophobia, eye pain, redness, hearing loss, ear pain, congestion, sore throat, rhinorrhea, sneezing, mouth sores, trouble swallowing, neck pain, neck stiffness and tinnitus.   Respiratory: Denies  cough, chest tightness.  Cardiovascular: Denies chest pain, palpitations. Gastrointestinal: Denies nausea, vomiting, abdominal pain, diarrhea, constipation, blood in stool and abdominal distention.  Genitourinary: Denies dysuria, urgency, frequency, hematuria, flank pain and difficulty urinating.  Endocrine: Denies: hot or cold intolerance, sweats, changes in hair or nails, polyuria, polydipsia. Musculoskeletal: Denies myalgias, back pain, joint swelling, arthralgias and gait problem.  Skin: Denies pallor, rash and wound.  Neurological: Denies dizziness, seizures, syncope, weakness, light-headedness, numbness and headaches.  Hematological: Denies adenopathy. Easy bruising, personal or family bleeding history  Psychiatric/Behavioral: Denies suicidal ideation, mood changes, confusion, nervousness, sleep disturbance and agitation    Physical Exam: Vitals:   06/05/21 1502  BP: 118/80  Pulse: 99  Temp: 99.3 F (37.4 C)  TempSrc: Oral  SpO2: (!) 88%  Weight: 200 lb 3.2 oz (90.8 kg)    Body mass index is 40.44 kg/m.   Constitutional: NAD, calm, comfortable, obese, in a wheelchair Eyes: PERRL, lids and conjunctivae normal ENMT: Mucous membranes are moist.  Respiratory:  Pronounced bilateral expiratory wheezes, no crackles normal respiratory effort. No accessory muscle use.  Cardiovascular: Regular rate and rhythm, no murmurs / rubs / gallops.  2+ pitting lower extremity edema, no edema is evident to me over upper extremities and face as daughter suggests.  Neurologic: Grossly intact and nonfocal Psychiatric: Normal judgment and insight. Alert and oriented x 3. Normal mood.    Impression and Plan:  Bilateral leg edema - Plan: DG Chest 2 View  SOB (shortness of breath) - Plan: DG Chest 2 View  Chronic respiratory failure with hypoxia and hypercapnia (HCC)  Stage 4 very severe COPD by GOLD classification (HCC)  -She appears to have acute on chronic respiratory failure, she is certainly not in respiratory extremis however admits to being more short of breath than her baseline. -Concerned that with her edema this might be a form of acute on chronic congestive heart failure, no crackles on lung auscultation and significant wheezing however make me think that this might be instead a COPD exacerbation. -She will be sent for a stat two-view chest x-ray today. -  I have asked the daughter to increase her torsemide from 20 to 40 mg daily for the next 7 days. -We will see if we can get her back into see her cardiologist in the next week, if not I will see her back in the office for evaluation. -She has an appointment tomorrow with her pulmonologist to assess her respiratory status.  Time spent: 36 minutes reviewing chart, interviewing patient and daughter, examining patient and formulating plan of care.   Patient Instructions  -Nice seeing you today!!  Chest xray today.  -Double your torsemide dose for 7 days.  -Will get you in to see cardiology or myself in 1 week.   Chaya Jan, MD [facility]

## 2021-06-05 NOTE — Patient Instructions (Signed)
-  Nice seeing you today!!  Chest xray today.  -Double your torsemide dose for 7 days.  -Will get you in to see cardiology or myself in 1 week.

## 2021-06-05 NOTE — Telephone Encounter (Signed)
Left message for patient to return call.

## 2021-06-06 ENCOUNTER — Ambulatory Visit: Payer: Medicare Other | Admitting: Internal Medicine

## 2021-06-06 ENCOUNTER — Ambulatory Visit (INDEPENDENT_AMBULATORY_CARE_PROVIDER_SITE_OTHER): Payer: Medicare Other | Admitting: Internal Medicine

## 2021-06-06 ENCOUNTER — Encounter: Payer: Self-pay | Admitting: Internal Medicine

## 2021-06-06 VITALS — BP 130/68 | HR 90 | Wt 204.0 lb

## 2021-06-06 DIAGNOSIS — I5033 Acute on chronic diastolic (congestive) heart failure: Secondary | ICD-10-CM

## 2021-06-06 MED ORDER — MAGNESIUM OXIDE 400 MG PO CAPS: 400.0000 mg | ORAL_CAPSULE | Freq: Every day | ORAL | 3 refills | Status: DC

## 2021-06-06 MED ORDER — TORSEMIDE 20 MG PO TABS: 40.0000 mg | ORAL_TABLET | Freq: Every day | ORAL | 3 refills | Status: DC

## 2021-06-06 NOTE — Progress Notes (Signed)
Cardiology Office Note:    Date:  06/06/2021   ID:  [name], [name] 06/01/1946, MRN [id number]  PCP:  [name], [name], MD   [facility]  Cardiologist:  Riley Lam MD Electrophysiologist:  Lewayne Bunting, MD   CC: Follow up AFl and HF  History of Present Illness:    [name] is a 75 y.o. female with a hx of hx of CAD s/p NSTEMI with 3 VD s/p LIMA but with preserved EF; COPD and ILD on Home O2 diastolic HF seen by Dr. Delton See.  Seen 11/30/20 for virtual visit for SVT.  In interim of this visit, patient had heart monitor notable for AFl.  Seen by AF clinic, stared on [facility], had some LE edema issues on placed on torsemide.  Last seen 01/24/21 In interim of this visit, patient had ED visit for chest pain (doesn't have memory of this).  05/24/21.  Since last visit saw EP 06/03/21.  No evidence of CHF at that time.  Seen 06/06/21.  Seen 06/05/21 with worsening leg swelling and hypoxia By PCP.  Torsemide increased and urgent visit made.  Patient and daughter note two days of leg swelling, SOB and inability to get her Pulse O2 at home.  No fevers chills, night sweats.  No palpitations; stable chest twinges.  Past Medical History:  Diagnosis Date   Arthritis    OA RIGHT KNEE WITH PAIN   Barrett esophagus    Bradycardia 06/01/2015   CAD in native artery    a. NSTEMI 05/2015 s/p emergent CABG.   Chronic diastolic (congestive) heart failure (HCC)    Chronic kidney disease, stage 3a (HCC)    Chronic respiratory failure (HCC)    Chronic respiratory failure with hypoxia and hypercapnia (HCC) 02/04/2010   Followed in Pulmonary clinic/ [facility]/ [facility]       - 02 dependent  since 07/02/10 >>  83% RA December 05, 2010       - ONO RA 08/05/12  :  Positive sat < 89 x 2:75m> repeat on 2lpm rec 08/12/2012  - 06/17/2013 reported desat with activity p Knee surgery > rec restart 2lpm with activity  - 06/27/2013   Walked 2lpm  x one lap @ 185 stopped due to sat  88% not sob , desat to 82% on RA just at th   COPD III spirometry if use FEV1/VC p saba  07/18/2010   Quit smoking May 2006       - PFT's  04/12/10 FEV1  1.21 (69%) ratio 77 and no change p B2,  DLC0 56%   VC 70%         - PFTs  08/08/2013 FEV1 1.21 (60%) ratio 86 and no change p B2 DLCO 79%  VC 72%  On symbicort 160 2bid  - PFT's  02/08/2018  FEV1 0.70 (40 % ) ratio 56 if use FEV1/VC  p 38 % improvement from saba p symb 160 prior to study with DLCO  78 % corrects to 147  % for alv volume   - 02/08/2018   Cough variant asthma 02/26/2011   Followed in Pulmonary clinic/ [facility]/ [facility]  - PFT's  06/04/15  FEV1 1.20 (67 % ) ratio 83  p 6 % improvement from saba with DLCO  80 % corrects to 132 % for alv volume      - Clinical dx based on response to symbicort       FENO 09/16/2016  =  96 on symbicort 160 2bid > added singulair  Allergy profile 09/16/2016 >  Eos 0.5 /  IgE  78 neg RAST  -  Referred to rehab 04/29/2017 > completed   Essential hypertension 04/20/2007   Qualifier: Diagnosis of  By: Marcelyn Ditty, RN, Katy Fitch    GERD (gastroesophageal reflux disease)    History of ARDS 2006   History of home oxygen therapy    AT NIGHT WHEN SLEEPING 2 L / MIN NASAL CANNULA   Hyperlipidemia 07/12/2015   Hypothyroidism    Intracranial hemorrhage (HCC) 2019   a. small intracranial hemorrhage in setting of HTN.   Mild carotid artery disease (HCC)    a. Duplex 1-39% bilaterally in 2016.   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   Followed as Primary Care Patient/ [facility]/ [facility]  -s/p ARDS 2006 with bacteremic S  Pna       - CT chest 07/03/10 Nonspecific PF mostly upper lobes       - CT chest 12/03/10 acute gg changes and  effusions c/w chf - PFT's  02/08/2018  FVC 0.64 (28 %)   with DLCO  78 % corrects to 147 % for alv volume      Rheumatic disease    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   SOB (shortness of breath) on exertion     Past Surgical History:  Procedure Laterality Date   ABDOMINOPLASTY     CARDIAC CATHETERIZATION N/A 06/01/2015   Procedure: Left Heart Cath and Coronary Angiography;  Surgeon: Runell Gess, MD;  Location: [facility] INVASIVE CV LAB;  Service: Cardiovascular;  Laterality: N/A;   CARDIOVERSION N/A 02/12/2021   Procedure: CARDIOVERSION;  Surgeon: Meriam Sprague, MD;  Location: [facility] ENDOSCOPY;  Service: Cardiovascular;  Laterality: N/A;   CARPAL TUNNEL RELEASE     CHOLECYSTECTOMY     CORONARY ARTERY BYPASS GRAFT N/A 06/04/2015   Procedure: CORONARY ARTERY BYPASS GRAFT times three            with left internal mammary artery and right leg saphenous vein;  Surgeon: Alleen Borne, MD;  Location: [facility] OR;  Service: Open Heart Surgery;  Laterality: N/A;   cosmetic breast surgery     JOINT REPLACEMENT     KNEE ARTHROSCOPY Left    TEE WITHOUT CARDIOVERSION  06/04/2015   Procedure: TRANSESOPHAGEAL ECHOCARDIOGRAM (TEE);  Surgeon: Alleen Borne, MD;  Location: [facility] OR;  Service: Open Heart Surgery;;   TEE WITHOUT CARDIOVERSION N/A 02/12/2021   Procedure: TRANSESOPHAGEAL ECHOCARDIOGRAM (TEE);  Surgeon: Meriam Sprague, MD;  Location: [facility] ENDOSCOPY;  Service: Cardiovascular;  Laterality: N/A;   TOTAL KNEE ARTHROPLASTY Left    TOTAL KNEE ARTHROPLASTY Right 06/06/2013   Procedure: RIGHT TOTAL KNEE ARTHROPLASTY;  Surgeon: Loanne Drilling, MD;  Location: [facility] ORS;  Service: Orthopedics;  Laterality: Right;    Current Medications: Current Meds  Medication Sig   acetaminophen (TYLENOL) 650 MG CR tablet Take 650 mg by mouth every 8 (eight) hours.   albuterol (VENTOLIN HFA) 108 (90 Base) MCG/ACT inhaler Inhale 2 puffs into the lungs every 4 (four) hours as needed (shortness of breath, if you can't catch your breath).    amoxicillin (AMOXIL) 500 MG tablet  Take 1,000 mg by mouth See admin instructions. Dental procedure   apixaban (ELIQUIS) 5 MG TABS tablet TAKE 1 TABLET BY MOUTH TWICE A DAY   atorvastatin (LIPITOR) 40 MG tablet TAKE 1 TABLET BY MOUTH  DAILY AT 6 PM   budesonide (PULMICORT) 0.25 MG/2ML nebulizer solution One twice daily   CALCIUM PO Take 1 tablet by mouth daily.   Cholecalciferol (VITAMIN D3) 2000 UNITS TABS Take 2,000 Int'l Units by mouth daily.   clotrimazole-betamethasone (LOTRISONE) cream Apply 1 application topically 2 (two) times daily as needed.   diltiazem (CARDIZEM CD) 240 MG 24 hr capsule Take 1 capsule (240 mg total) by mouth daily.   dofetilide (TIKOSYN) 250 MCG capsule Take 1 capsule (250 mcg total) by mouth 2 (two) times daily.   DULoxetine (CYMBALTA) 30 MG capsule Take 1 capsule (30 mg total) by mouth daily.   formoterol (PERFOROMIST) 20 MCG/2ML nebulizer solution Take 2 mLs (20 mcg total) by nebulization 2 (two) times daily. Use in nebulizer twice daily perfectly regularly   golimumab (SIMPONI ARIA) 50 MG/4ML SOLN injection Inject 50 mg into the vein every 8 (eight) weeks.    ketoconazole (NIZORAL) 2 % cream Apply 1 application topically daily as needed for irritation.   leflunomide (ARAVA) 20 MG tablet Take 1 tablet (20 mg total) by mouth daily.   levothyroxine (SYNTHROID) 125 MCG tablet TAKE 1 TABLET BY MOUTH EVERY DAY   losartan (COZAAR) 50 MG tablet Take 1 tablet (50 mg total) by mouth daily.   Magnesium Oxide 400 MG CAPS Take 1 capsule (400 mg total) by mouth daily.   montelukast (SINGULAIR) 10 MG tablet Take 1 tablet by mouth daily.   nitroGLYCERIN (NITROSTAT) 0.4 MG SL tablet Place 1 tablet (0.4 mg total) under the tongue every 5 (five) minutes as needed for chest pain.   nystatin (MYCOSTATIN/NYSTOP) powder APPLY 1 APPLICATION        TOPICALLY 3 TIMES A DAY   oxyCODONE (OXY IR/ROXICODONE) 5 MG immediate release tablet Take 10 mg by mouth in the morning and at bedtime.    OXYGEN Inhale 2 L into the lungs continuous. continuous o2   pantoprazole (PROTONIX) 40 MG tablet Take 1 tablet (40 mg total) by mouth daily before breakfast.   pregabalin (LYRICA) 50 MG capsule Take 50 mg by mouth at bedtime.   torsemide (DEMADEX) 20 MG tablet Take 2 tablets (40 mg total) by mouth daily.   triamcinolone cream (KENALOG) 0.1 % Apply 1 application topically 2 (two) times daily as needed (for psoriasis).    umeclidinium bromide (INCRUSE ELLIPTA) 62.5 MCG/INH AEPB Inhale 1 puff into the lungs daily.   vitamin B-12 (CYANOCOBALAMIN) 1000 MCG tablet Take 1,000 mcg by mouth daily.   [DISCONTINUED] torsemide (DEMADEX) 20 MG tablet Take 1 tablet (20 mg total) by mouth daily.     Allergies:   Gabapentin   Social History   Socioeconomic History   Marital status: Widowed    Spouse name: Not on file   Number of children: Not on file   Years of education: Not on file   Highest education level: Not on file  Occupational History   Occupation: retired    Associate Professor: [employer]  Tobacco Use   Smoking status: Former    Packs/day: 1.50    Years: 58.50    Pack years: 87.75    Types: Cigarettes    Quit date: 03/03/2005    Years since quitting: 16.2   Smokeless tobacco: Never  Vaping Use   Vaping  Use: Never used  Substance and Sexual Activity   Alcohol use: No   Drug use: No   Sexual activity: Not on file  Other Topics Concern   Not on file  Social History Narrative   Not on file   Social Determinants of Health   Financial Resource Strain: Medium Risk   Difficulty of Paying Living Expenses: Somewhat hard  Food Insecurity: Not on file  Transportation Needs: No Transportation Needs   Lack of Transportation (Medical): No   Lack of Transportation (Non-Medical): No  Physical Activity: Not on file  Stress: Not on file  Social Connections: Not on file    Social:  I see her and her daughter frequently, she has a former husband cared for by Dr. Delton See  Family History: The  patient's family history includes Breast cancer in her mother; Coronary artery disease in her father; Rheum arthritis in her father.  ROS:   Please see the history of present illness.     All other systems reviewed and are negative.  EKGs/Labs/Other Studies Reviewed:    The following studies were reviewed today:  EKG:   06/06/21:  SR rate 91 Baseline artifact Qtc 460 05/24/21: SR 1st HB rate 78 QTc 460 01/07/21: SR rate 74 RAE  Cardiac Event Monitoring: Date:12/24/20 Results: Patient had a minimum heart rate of 65 bpm, maximum heart rate of 214 bpm, and average heart rate of 104 bpm. Predominant underlying rhythm was sinus rhythm with 1st heart block. One run of non-sustained ventricular tachycardia occurred lasting 6 beats at longest with a max rate of 214 bpm at fastest. Isolated PACs were occasional (3.6%), with rare couplets and triplets present. Isolated PVCs were rare (<1.0%), with rare couplets and trigeminy present. Atrial Tachycardia vs Atrial Flutter occurred (37% burden), ranging from 65-198 bpm (avg of 133 bpm), the longest lasting 2 days 16 hours with an avg rate of 135 bpm. Present at activation and de-activation of device. Triggered and diary events associated with sinus rhythm, atrial tachycardia, atrial flutter, and PVCs.   Likely both atrial tachycardia and atrial flutter is present.   Transthoracic Echocardiogram: Date: 12/27/20 Results: 1. Left ventricular ejection fraction, by estimation, is 60 to 65%. The  left ventricle has normal function. The left ventricle has no regional  wall motion abnormalities. There is severe left ventricular hypertrophy.  Left ventricular diastolic parameters   are indeterminate.   2. Right ventricular systolic function is normal. The right ventricular  size is normal.   3. Left atrial size was moderately dilated.   4. The mitral valve is normal in structure. No evidence of mitral valve  regurgitation. No evidence of mitral  stenosis. Moderate mitral annular  calcification.   5. The aortic valve is normal in structure. Aortic valve regurgitation is  not visualized. No aortic stenosis is present.   6. The inferior vena cava is dilated in size with <50% respiratory  variability, suggesting right atrial pressure of 15 mmHg.   Left/Right Heart Catheterizations: Date:06/01/2015 Results: Prox LAD to Mid LAD lesion, 75% stenosed. Prox Cx lesion, 95% stenosed. Prox RCA to Mid RCA lesion, 90% stenosed. The left ventricular systolic function is normal.    Recent Labs: 12/27/2020: B Natriuretic Peptide 329.1 12/28/2020: TSH 0.752 04/22/2021: ALT 13; Hemoglobin 11.4; Platelets 281 06/03/2021: BUN 20; Creatinine, Ser 1.00; Magnesium 1.7; Potassium 4.0; Sodium 142  Recent Lipid Panel    Component Value Date/Time   CHOL 164 01/18/2020 1110   CHOL 117 07/12/2015 1508   TRIG 117  01/18/2020 1110   TRIG 75 07/12/2015 1508   HDL 56 01/18/2020 1110   HDL 51 07/12/2015 1508   CHOLHDL 2.9 01/18/2020 1110   CHOLHDL 2.3 08/25/2018 2019   VLDL 22 08/25/2018 2019   LDLCALC 87 01/18/2020 1110   LDLCALC 51 07/12/2015 1508   LDLDIRECT 131.1 08/10/2012 0829    Risk Assessment/Calculations:    CHA2DS2-VASc Score = 7  This indicates a 11.2% annual risk of stroke. The patient's score is based upon: CHF History: Yes HTN History: Yes Diabetes History: No Stroke History: Yes Vascular Disease History: Yes Age Score: 1 Gender Score: 1      Physical Exam:    VS:  BP 130/68   Pulse 90   Wt 204 lb (92.5 kg)   SpO2 92%   BMI 41.20 kg/m     Wt Readings from Last 3 Encounters:  06/06/21 204 lb (92.5 kg)  06/05/21 200 lb 3.2 oz (90.8 kg)  06/03/21 204 lb (92.5 kg)    GEN: Obese female well developed in no acute distress HEENT: Frank's Signs NECK: No JVD LYMPHATICS: No lymphadenopathy CARDIAC: RRR, no murmurs, rubs, gallops RESPIRATORY:  Good air movement and expiratory wheezes ABDOMEN: Soft, non-tender,  non-distended MUSCULOSKELETAL:  +2 edema; No deformity  SKIN: Warm and dry NEUROLOGIC:  Alert and oriented x 3, resting tremor both arms and legs PSYCHIATRIC:  Normal affect   ASSESSMENT:    1. Acute on chronic diastolic heart failure (HCC)      PLAN:     Heart Failure Preserved Ejection Fraction  Morbid Obesity COPD HTN CKD IIIb - NYHA class II, Stage III, hypervolemic, etiology multifactorial - Diuretic regimen: Torsemide 40 mg Po Daily - start mag oxide - follow up labs in one -1 weeks  Coronary Artery Disease; Obstructive s/p CABG COPD - with occasional CP - anatomy: CABGx3 (LIMA-LAD, SVG-OM1, SVG-RCA) ~ 2016 - on Eliquis and no ASA - continue statin, goal LDL < 70 ; will recheck at next visit if not done with PCP - NO BB b/c of COPD - no Ranexa for Qtc - No PRN nitro  Paroxysmal Atrial Flutter - CHADSVASC=7. - Continue anticoagulation with Eliquis; at age 14 may meet decreased dose critereia - Continue rate control with diltiazem 240 mg  - Rhythm control options limited by pulmonary disease - Seeing Dr. Ladona Ridgel and s/p Tikosyn load - QTc 460 and stable - discussed medications at Qtc monitoring   Three week follow up  IF optimized again will address hip surgery  Medication Adjustments/Labs and Tests Ordered: Current medicines are reviewed at length with the patient today.  Concerns regarding medicines are outlined above.  Orders Placed This Encounter  Procedures   Basic metabolic panel   Pro b natriuretic peptide (BNP)   Magnesium   EKG 12-Lead     Meds ordered this encounter  Medications   torsemide (DEMADEX) 20 MG tablet    Sig: Take 2 tablets (40 mg total) by mouth daily.    Dispense:  180 tablet    Refill:  3   Magnesium Oxide 400 MG CAPS    Sig: Take 1 capsule (400 mg total) by mouth daily.    Dispense:  90 capsule    Refill:  3      Patient Instructions  Medication Instructions:  Your physician has recommended you make the following  change in your medication:  INCREASE: torsemide to 40 mg by mouth daily  *If you need a refill on your cardiac medications before your  next appointment, please call your pharmacy*   Lab Work: IN 1 WEEK: BNP, BMP, Mg  If you have labs (blood work) drawn today and your tests are completely normal, you will receive your results only by: MyChart Message (if you have MyChart) OR A paper copy in the mail If you have any lab test that is abnormal or we need to change your treatment, we will call you to review the results.   Testing/Procedures: NONE   Follow-Up: At [facility], you and your health needs are our priority.  As part of our continuing mission to provide you with exceptional heart care, we have created designated Provider Care Teams.  These Care Teams include your primary Cardiologist (physician) and Advanced Practice Providers (APPs -  Physician Assistants and Nurse Practitioners) who all work together to provide you with the care you need, when you need it.   Your next appointment:   3 week(s)  The format for your next appointment:   In Person  Provider:   You may see Riley Lam, MD or one of the following Advanced Practice Providers on your designated Care Team:   Ronie Spies, PA-C Jacolyn Reedy, PA-C    Signed, Christell Constant, MD  06/06/2021 6:58 PM    [facility]

## 2021-06-06 NOTE — Patient Instructions (Addendum)
Medication Instructions:  Your physician has recommended you make the following change in your medication:  INCREASE: torsemide to 40 mg by mouth daily  *If you need a refill on your cardiac medications before your next appointment, please call your pharmacy*   Lab Work: IN 1 WEEK: BNP, BMP, Mg  If you have labs (blood work) drawn today and your tests are completely normal, you will receive your results only by: MyChart Message (if you have MyChart) OR A paper copy in the mail If you have any lab test that is abnormal or we need to change your treatment, we will call you to review the results.   Testing/Procedures: NONE   Follow-Up: At [facility], you and your health needs are our priority.  As part of our continuing mission to provide you with exceptional heart care, we have created designated Provider Care Teams.  These Care Teams include your primary Cardiologist (physician) and Advanced Practice Providers (APPs -  Physician Assistants and Nurse Practitioners) who all work together to provide you with the care you need, when you need it.   Your next appointment:   3 week(s)  The format for your next appointment:   In Person  Provider:   You may see Riley Lam, MD or one of the following Advanced Practice Providers on your designated Care Team:   Ronie Spies, PA-C Jacolyn Reedy, PA-C

## 2021-06-11 ENCOUNTER — Other Ambulatory Visit: Payer: Self-pay

## 2021-06-12 ENCOUNTER — Ambulatory Visit: Payer: Medicare Other | Admitting: Internal Medicine

## 2021-06-12 ENCOUNTER — Telehealth: Payer: Self-pay | Admitting: Internal Medicine

## 2021-06-12 DIAGNOSIS — M25552 Pain in left hip: Secondary | ICD-10-CM | POA: Diagnosis not present

## 2021-06-12 DIAGNOSIS — M48061 Spinal stenosis, lumbar region without neurogenic claudication: Secondary | ICD-10-CM | POA: Diagnosis not present

## 2021-06-12 DIAGNOSIS — M5430 Sciatica, unspecified side: Secondary | ICD-10-CM | POA: Diagnosis not present

## 2021-06-12 DIAGNOSIS — M5416 Radiculopathy, lumbar region: Secondary | ICD-10-CM | POA: Diagnosis not present

## 2021-06-12 DIAGNOSIS — I10 Essential (primary) hypertension: Secondary | ICD-10-CM | POA: Diagnosis not present

## 2021-06-12 DIAGNOSIS — J449 Chronic obstructive pulmonary disease, unspecified: Secondary | ICD-10-CM | POA: Diagnosis not present

## 2021-06-12 NOTE — Telephone Encounter (Signed)
LMTCB

## 2021-06-13 ENCOUNTER — Other Ambulatory Visit: Payer: Self-pay

## 2021-06-13 ENCOUNTER — Other Ambulatory Visit: Payer: Medicare Other | Admitting: *Deleted

## 2021-06-13 DIAGNOSIS — Z79891 Long term (current) use of opiate analgesic: Secondary | ICD-10-CM | POA: Diagnosis not present

## 2021-06-13 DIAGNOSIS — I5033 Acute on chronic diastolic (congestive) heart failure: Secondary | ICD-10-CM

## 2021-06-13 DIAGNOSIS — M5416 Radiculopathy, lumbar region: Secondary | ICD-10-CM | POA: Diagnosis not present

## 2021-06-14 ENCOUNTER — Telehealth: Payer: Self-pay

## 2021-06-14 DIAGNOSIS — M5416 Radiculopathy, lumbar region: Secondary | ICD-10-CM | POA: Diagnosis not present

## 2021-06-14 DIAGNOSIS — J449 Chronic obstructive pulmonary disease, unspecified: Secondary | ICD-10-CM | POA: Diagnosis not present

## 2021-06-14 DIAGNOSIS — I10 Essential (primary) hypertension: Secondary | ICD-10-CM | POA: Diagnosis not present

## 2021-06-14 DIAGNOSIS — M48061 Spinal stenosis, lumbar region without neurogenic claudication: Secondary | ICD-10-CM | POA: Diagnosis not present

## 2021-06-14 DIAGNOSIS — M25552 Pain in left hip: Secondary | ICD-10-CM | POA: Diagnosis not present

## 2021-06-14 DIAGNOSIS — M5430 Sciatica, unspecified side: Secondary | ICD-10-CM | POA: Diagnosis not present

## 2021-06-14 LAB — BASIC METABOLIC PANEL
BUN/Creatinine Ratio: 22 (ref 12–28)
BUN: 29 mg/dL — ABNORMAL HIGH (ref 8–27)
CO2: 38 mmol/L — ABNORMAL HIGH (ref 20–29)
Calcium: 9.7 mg/dL (ref 8.7–10.3)
Chloride: 90 mmol/L — ABNORMAL LOW (ref 96–106)
Creatinine, Ser: 1.3 mg/dL — ABNORMAL HIGH (ref 0.57–1.00)
Glucose: 122 mg/dL — ABNORMAL HIGH (ref 65–99)
Potassium: 4.2 mmol/L (ref 3.5–5.2)
Sodium: 142 mmol/L (ref 134–144)
eGFR: 43 mL/min/{1.73_m2} — ABNORMAL LOW (ref >59)

## 2021-06-14 LAB — MAGNESIUM: Magnesium: 2.2 mg/dL (ref 1.6–2.3)

## 2021-06-14 LAB — PRO B NATRIURETIC PEPTIDE: NT-Pro BNP: 588 pg/mL — ABNORMAL HIGH (ref 0–301)

## 2021-06-14 MED ORDER — TORSEMIDE 20 MG PO TABS: 40.0000 mg | ORAL_TABLET | Freq: Two times a day (BID) | ORAL | 3 refills | Status: DC

## 2021-06-14 NOTE — Telephone Encounter (Signed)
-----   Message from Christell Constant, MD sent at 06/14/2021 12:03 PM EDT ----- Results: Similar kidney function from trends past two months BNP is elevated MG is improved Plan: Based on Sx, would increase Toresmide to 40 mg PO BID prior to upcoming follow up  Christell Constant, MD

## 2021-06-14 NOTE — Telephone Encounter (Signed)
Order placed for torsemide per MD recommendation.

## 2021-06-17 ENCOUNTER — Other Ambulatory Visit (HOSPITAL_COMMUNITY): Payer: Self-pay

## 2021-06-17 ENCOUNTER — Ambulatory Visit (HOSPITAL_BASED_OUTPATIENT_CLINIC_OR_DEPARTMENT_OTHER): Payer: Medicare Other | Attending: Internal Medicine | Admitting: Pulmonary Disease

## 2021-06-17 ENCOUNTER — Encounter (HOSPITAL_COMMUNITY): Payer: Self-pay

## 2021-06-17 ENCOUNTER — Emergency Department (HOSPITAL_COMMUNITY): Payer: Medicare Other

## 2021-06-17 ENCOUNTER — Telehealth: Payer: Self-pay | Admitting: Internal Medicine

## 2021-06-17 ENCOUNTER — Inpatient Hospital Stay (HOSPITAL_COMMUNITY)
Admission: EM | Admit: 2021-06-17 | Discharge: 2021-06-21 | DRG: 189 | Disposition: A | Discharge status: Home Health | Payer: Medicare Other | Attending: Family Medicine | Admitting: Family Medicine

## 2021-06-17 ENCOUNTER — Other Ambulatory Visit: Payer: Self-pay

## 2021-06-17 DIAGNOSIS — I10 Essential (primary) hypertension: Secondary | ICD-10-CM | POA: Diagnosis present

## 2021-06-17 DIAGNOSIS — Z9889 Other specified postprocedural states: Secondary | ICD-10-CM | POA: Diagnosis not present

## 2021-06-17 DIAGNOSIS — R0902 Hypoxemia: Secondary | ICD-10-CM

## 2021-06-17 DIAGNOSIS — E872 Acidosis: Secondary | ICD-10-CM | POA: Diagnosis present

## 2021-06-17 DIAGNOSIS — Z9981 Dependence on supplemental oxygen: Secondary | ICD-10-CM

## 2021-06-17 DIAGNOSIS — J9621 Acute and chronic respiratory failure with hypoxia: Principal | ICD-10-CM | POA: Diagnosis present

## 2021-06-17 DIAGNOSIS — Z6841 Body Mass Index (BMI) 40.0 and over, adult: Secondary | ICD-10-CM

## 2021-06-17 DIAGNOSIS — F332 Major depressive disorder, recurrent severe without psychotic features: Secondary | ICD-10-CM | POA: Diagnosis not present

## 2021-06-17 DIAGNOSIS — I2510 Atherosclerotic heart disease of native coronary artery without angina pectoris: Secondary | ICD-10-CM | POA: Diagnosis present

## 2021-06-17 DIAGNOSIS — Z7951 Long term (current) use of inhaled steroids: Secondary | ICD-10-CM

## 2021-06-17 DIAGNOSIS — M1611 Unilateral primary osteoarthritis, right hip: Secondary | ICD-10-CM | POA: Diagnosis present

## 2021-06-17 DIAGNOSIS — J9622 Acute and chronic respiratory failure with hypercapnia: Secondary | ICD-10-CM | POA: Diagnosis present

## 2021-06-17 DIAGNOSIS — Z888 Allergy status to other drugs, medicaments and biological substances status: Secondary | ICD-10-CM

## 2021-06-17 DIAGNOSIS — J811 Chronic pulmonary edema: Secondary | ICD-10-CM | POA: Diagnosis not present

## 2021-06-17 DIAGNOSIS — E039 Hypothyroidism, unspecified: Secondary | ICD-10-CM | POA: Diagnosis present

## 2021-06-17 DIAGNOSIS — Z951 Presence of aortocoronary bypass graft: Secondary | ICD-10-CM

## 2021-06-17 DIAGNOSIS — Z20822 Contact with and (suspected) exposure to covid-19: Secondary | ICD-10-CM | POA: Diagnosis present

## 2021-06-17 DIAGNOSIS — Z79891 Long term (current) use of opiate analgesic: Secondary | ICD-10-CM

## 2021-06-17 DIAGNOSIS — R531 Weakness: Secondary | ICD-10-CM | POA: Diagnosis not present

## 2021-06-17 DIAGNOSIS — R062 Wheezing: Secondary | ICD-10-CM

## 2021-06-17 DIAGNOSIS — J8410 Pulmonary fibrosis, unspecified: Secondary | ICD-10-CM | POA: Diagnosis present

## 2021-06-17 DIAGNOSIS — N179 Acute kidney failure, unspecified: Secondary | ICD-10-CM

## 2021-06-17 DIAGNOSIS — Z8261 Family history of arthritis: Secondary | ICD-10-CM

## 2021-06-17 DIAGNOSIS — I4892 Unspecified atrial flutter: Secondary | ICD-10-CM | POA: Diagnosis present

## 2021-06-17 DIAGNOSIS — I482 Chronic atrial fibrillation, unspecified: Secondary | ICD-10-CM | POA: Diagnosis present

## 2021-06-17 DIAGNOSIS — D649 Anemia, unspecified: Secondary | ICD-10-CM | POA: Diagnosis present

## 2021-06-17 DIAGNOSIS — K219 Gastro-esophageal reflux disease without esophagitis: Secondary | ICD-10-CM | POA: Diagnosis present

## 2021-06-17 DIAGNOSIS — E785 Hyperlipidemia, unspecified: Secondary | ICD-10-CM | POA: Diagnosis present

## 2021-06-17 DIAGNOSIS — G8929 Other chronic pain: Secondary | ICD-10-CM | POA: Diagnosis present

## 2021-06-17 DIAGNOSIS — Z79899 Other long term (current) drug therapy: Secondary | ICD-10-CM

## 2021-06-17 DIAGNOSIS — M1711 Unilateral primary osteoarthritis, right knee: Secondary | ICD-10-CM | POA: Diagnosis present

## 2021-06-17 DIAGNOSIS — L4050 Arthropathic psoriasis, unspecified: Secondary | ICD-10-CM | POA: Diagnosis present

## 2021-06-17 DIAGNOSIS — I5032 Chronic diastolic (congestive) heart failure: Secondary | ICD-10-CM | POA: Diagnosis present

## 2021-06-17 DIAGNOSIS — D72829 Elevated white blood cell count, unspecified: Secondary | ICD-10-CM | POA: Diagnosis present

## 2021-06-17 DIAGNOSIS — Z79890 Hormone replacement therapy (postmenopausal): Secondary | ICD-10-CM

## 2021-06-17 DIAGNOSIS — Z7901 Long term (current) use of anticoagulants: Secondary | ICD-10-CM

## 2021-06-17 DIAGNOSIS — Z66 Do not resuscitate: Secondary | ICD-10-CM | POA: Diagnosis present

## 2021-06-17 DIAGNOSIS — G253 Myoclonus: Secondary | ICD-10-CM | POA: Diagnosis present

## 2021-06-17 DIAGNOSIS — G9341 Metabolic encephalopathy: Secondary | ICD-10-CM | POA: Diagnosis present

## 2021-06-17 DIAGNOSIS — R251 Tremor, unspecified: Secondary | ICD-10-CM | POA: Diagnosis not present

## 2021-06-17 DIAGNOSIS — M069 Rheumatoid arthritis, unspecified: Secondary | ICD-10-CM | POA: Diagnosis present

## 2021-06-17 DIAGNOSIS — I130 Hypertensive heart and chronic kidney disease with heart failure and stage 1 through stage 4 chronic kidney disease, or unspecified chronic kidney disease: Secondary | ICD-10-CM | POA: Diagnosis present

## 2021-06-17 DIAGNOSIS — Z87891 Personal history of nicotine dependence: Secondary | ICD-10-CM

## 2021-06-17 DIAGNOSIS — R0602 Shortness of breath: Secondary | ICD-10-CM | POA: Diagnosis not present

## 2021-06-17 DIAGNOSIS — Z8249 Family history of ischemic heart disease and other diseases of the circulatory system: Secondary | ICD-10-CM

## 2021-06-17 DIAGNOSIS — J449 Chronic obstructive pulmonary disease, unspecified: Secondary | ICD-10-CM | POA: Diagnosis present

## 2021-06-17 DIAGNOSIS — N1832 Chronic kidney disease, stage 3b: Secondary | ICD-10-CM | POA: Diagnosis present

## 2021-06-17 DIAGNOSIS — I252 Old myocardial infarction: Secondary | ICD-10-CM

## 2021-06-17 DIAGNOSIS — I480 Paroxysmal atrial fibrillation: Secondary | ICD-10-CM | POA: Diagnosis present

## 2021-06-17 LAB — CBC WITH DIFFERENTIAL/PLATELET
Abs Immature Granulocytes: 0.05 10*3/uL (ref 0.00–0.07)
Basophils Absolute: 0.1 10*3/uL (ref 0.0–0.1)
Basophils Relative: 1 %
Eosinophils Absolute: 0.4 10*3/uL (ref 0.0–0.5)
Eosinophils Relative: 3 %
HCT: 33.7 % — ABNORMAL LOW (ref 36.0–46.0)
Hemoglobin: 9.7 g/dL — ABNORMAL LOW (ref 12.0–15.0)
Immature Granulocytes: 1 %
Lymphocytes Relative: 14 %
Lymphs Abs: 1.5 10*3/uL (ref 0.7–4.0)
MCH: 27.8 pg (ref 26.0–34.0)
MCHC: 28.8 g/dL — ABNORMAL LOW (ref 30.0–36.0)
MCV: 96.6 fL (ref 80.0–100.0)
Monocytes Absolute: 1.1 10*3/uL — ABNORMAL HIGH (ref 0.1–1.0)
Monocytes Relative: 10 %
Neutro Abs: 7.8 10*3/uL — ABNORMAL HIGH (ref 1.7–7.7)
Neutrophils Relative %: 71 %
Platelets: 226 10*3/uL (ref 150–400)
RBC: 3.49 MIL/uL — ABNORMAL LOW (ref 3.87–5.11)
RDW: 14.4 % (ref 11.5–15.5)
WBC: 10.8 10*3/uL — ABNORMAL HIGH (ref 4.0–10.5)
nRBC: 0 % (ref 0.0–0.2)

## 2021-06-17 LAB — BASIC METABOLIC PANEL
Anion gap: 12 (ref 5–15)
BUN: 51 mg/dL — ABNORMAL HIGH (ref 8–23)
CO2: 39 mmol/L — ABNORMAL HIGH (ref 22–32)
Calcium: 9.3 mg/dL (ref 8.9–10.3)
Chloride: 86 mmol/L — ABNORMAL LOW (ref 98–111)
Creatinine, Ser: 2.39 mg/dL — ABNORMAL HIGH (ref 0.44–1.00)
GFR, Estimated: 21 mL/min — ABNORMAL LOW (ref >60)
Glucose, Bld: 135 mg/dL — ABNORMAL HIGH (ref 70–99)
Potassium: 4.4 mmol/L (ref 3.5–5.1)
Sodium: 137 mmol/L (ref 135–145)

## 2021-06-17 LAB — RESP PANEL BY RT-PCR (FLU A&B, COVID) ARPGX2
Influenza A by PCR: NEGATIVE
Influenza B by PCR: NEGATIVE
SARS Coronavirus 2 by RT PCR: NEGATIVE

## 2021-06-17 MED ORDER — APIXABAN 5 MG PO TABS: 5.0000 mg | ORAL_TABLET | Freq: Two times a day (BID) | ORAL | 0 refills | Status: DC

## 2021-06-17 NOTE — Telephone Encounter (Signed)
They do these all the time with the purpose to set the cpap up so that the oxygen need  is minimized but they will still likely need to leave her on some 02 thruout the study as her baseline requirement is so high - so if they can't supplement the cpap with 02 she will need to regroup and cancel the cpap  - ? Who ordered this ?  - she needs to be seeing sleep medicine here to have this done right if they can't accomodate her.   Defer these issues to sleep medicine doc if/when identified

## 2021-06-17 NOTE — Telephone Encounter (Signed)
Called and spoke with [name] letting him know the info stated by MW and he verbalized understanding. Nothing further needed.

## 2021-06-17 NOTE — ED Provider Notes (Addendum)
Emergency Medicine Provider Triage Evaluation Note  [name] , a 75 y.o. female  was evaluated in triage.  Pt complains of weakness.  The patient went for sleep study tonight.  Staff asked the patient to reschedule the study as they were concerned that the patient seemed " more stressed and anxious tonight".  When the patient was leaving the study, she was unable to transfer from her wheelchair into the car due to generalized weakness, which prompted her to come to the emergency department for further evaluation.  She does report that she has been more weak over the last few days.  She has also developed new intermittent tremors to her upper and lower extremities over the last few weeks.  She also adds that she has been wheezing more recently and is concerned that she is developing a cold.  She wears 2 to 3 L of home O2.  She is currently on 3 L of home O2.  Denies shortness of breath, chest pain, numbness, headache, visual changes, fever, vomiting, diarrhea.  Patient was started on Tikosyn about 3 weeks ago and started on Eliquis within the last few months.  No other recent medication changes.  Review of Systems  Positive: Weakness, tremors Negative: Shortness of breath, chest pain, numbness, headache, visual changes, fever, vomiting, diarrhea  Physical Exam  There were no vitals taken for this visit. Gen:   Awake, no distress   Resp:  Normal effort, nasal cannula in place, scattered wheezes MSK:   Moves extremities without difficulty  Other:  Intermittent tremors noted to the bilateral upper and lower extremities.  ANO x3.  GCS 15.  Good grip strength bilaterally.  Good strength against resistance with dorsiflexion plantarflexion.  Medical Decision Making  Medically screening exam initiated at 10:22 PM.  Appropriate orders placed.  Rajvi Armentor was informed that the remainder of the evaluation will be completed by another provider, this initial triage assessment does not replace  that evaluation, and the importance of remaining in the ED until their evaluation is complete.  Labs and imaging have been ordered.  She will require further work-up and evaluation in the emergency department.   Barkley Boards, PA-C 06/17/21 2234    Barkley Boards, PA-C 06/17/21 2237    Cheryll Cockayne, MD 06/20/21 [phone]

## 2021-06-17 NOTE — Telephone Encounter (Signed)
Call made to [facility], confirmed patient DOB. [name] states if she comes in with her oxygen they will start the study with her oxygen. If she does not they will not place her on oxygen unless she needs it. He is wanting to know how Dr wert would like the study done. Patient is scheduled for sleep study tonight.   Call made to patient, she reports she wears her oxygen at all times. Her concern is that she cannot bring in her in her concentrator. I made her aware the sleep center has oxygen they can use.   MW please advise if you want sleep study while patient is on oxygen. Study is tonight. Thanks :)

## 2021-06-17 NOTE — ED Triage Notes (Signed)
Pt reports that she has been having chronic tremors that has been going on for a while, told that is it a side effect of her medication but not sure which ones, today feels tremors are worse

## 2021-06-18 ENCOUNTER — Telehealth: Payer: Self-pay | Admitting: Physician Assistant

## 2021-06-18 DIAGNOSIS — I480 Paroxysmal atrial fibrillation: Secondary | ICD-10-CM | POA: Diagnosis present

## 2021-06-18 DIAGNOSIS — J8410 Pulmonary fibrosis, unspecified: Secondary | ICD-10-CM

## 2021-06-18 DIAGNOSIS — J9621 Acute and chronic respiratory failure with hypoxia: Secondary | ICD-10-CM | POA: Diagnosis present

## 2021-06-18 DIAGNOSIS — E785 Hyperlipidemia, unspecified: Secondary | ICD-10-CM | POA: Diagnosis present

## 2021-06-18 DIAGNOSIS — Z6841 Body Mass Index (BMI) 40.0 and over, adult: Secondary | ICD-10-CM | POA: Diagnosis not present

## 2021-06-18 DIAGNOSIS — J9622 Acute and chronic respiratory failure with hypercapnia: Secondary | ICD-10-CM

## 2021-06-18 DIAGNOSIS — Z951 Presence of aortocoronary bypass graft: Secondary | ICD-10-CM | POA: Diagnosis not present

## 2021-06-18 DIAGNOSIS — I4892 Unspecified atrial flutter: Secondary | ICD-10-CM | POA: Diagnosis present

## 2021-06-18 DIAGNOSIS — Z66 Do not resuscitate: Secondary | ICD-10-CM | POA: Diagnosis present

## 2021-06-18 DIAGNOSIS — F332 Major depressive disorder, recurrent severe without psychotic features: Secondary | ICD-10-CM

## 2021-06-18 DIAGNOSIS — I10 Essential (primary) hypertension: Secondary | ICD-10-CM | POA: Diagnosis not present

## 2021-06-18 DIAGNOSIS — I130 Hypertensive heart and chronic kidney disease with heart failure and stage 1 through stage 4 chronic kidney disease, or unspecified chronic kidney disease: Secondary | ICD-10-CM | POA: Diagnosis present

## 2021-06-18 DIAGNOSIS — I5032 Chronic diastolic (congestive) heart failure: Secondary | ICD-10-CM | POA: Diagnosis present

## 2021-06-18 DIAGNOSIS — R0902 Hypoxemia: Secondary | ICD-10-CM | POA: Diagnosis not present

## 2021-06-18 DIAGNOSIS — E872 Acidosis: Secondary | ICD-10-CM | POA: Diagnosis present

## 2021-06-18 DIAGNOSIS — N179 Acute kidney failure, unspecified: Secondary | ICD-10-CM | POA: Diagnosis present

## 2021-06-18 DIAGNOSIS — D649 Anemia, unspecified: Secondary | ICD-10-CM | POA: Diagnosis present

## 2021-06-18 DIAGNOSIS — Z20822 Contact with and (suspected) exposure to covid-19: Secondary | ICD-10-CM | POA: Diagnosis present

## 2021-06-18 DIAGNOSIS — E039 Hypothyroidism, unspecified: Secondary | ICD-10-CM | POA: Diagnosis present

## 2021-06-18 DIAGNOSIS — G8929 Other chronic pain: Secondary | ICD-10-CM | POA: Diagnosis present

## 2021-06-18 DIAGNOSIS — L4050 Arthropathic psoriasis, unspecified: Secondary | ICD-10-CM | POA: Diagnosis present

## 2021-06-18 DIAGNOSIS — D72829 Elevated white blood cell count, unspecified: Secondary | ICD-10-CM | POA: Diagnosis present

## 2021-06-18 DIAGNOSIS — E782 Mixed hyperlipidemia: Secondary | ICD-10-CM | POA: Diagnosis not present

## 2021-06-18 DIAGNOSIS — G9341 Metabolic encephalopathy: Secondary | ICD-10-CM | POA: Diagnosis present

## 2021-06-18 DIAGNOSIS — R0602 Shortness of breath: Secondary | ICD-10-CM | POA: Diagnosis not present

## 2021-06-18 DIAGNOSIS — G253 Myoclonus: Secondary | ICD-10-CM | POA: Diagnosis present

## 2021-06-18 DIAGNOSIS — M069 Rheumatoid arthritis, unspecified: Secondary | ICD-10-CM | POA: Diagnosis present

## 2021-06-18 DIAGNOSIS — J449 Chronic obstructive pulmonary disease, unspecified: Secondary | ICD-10-CM | POA: Diagnosis present

## 2021-06-18 DIAGNOSIS — N1832 Chronic kidney disease, stage 3b: Secondary | ICD-10-CM | POA: Diagnosis present

## 2021-06-18 DIAGNOSIS — I509 Heart failure, unspecified: Secondary | ICD-10-CM | POA: Diagnosis not present

## 2021-06-18 LAB — BLOOD GAS, VENOUS
Acid-Base Excess: 11.8 mmol/L — ABNORMAL HIGH (ref 0.0–2.0)
Bicarbonate: 37.7 mmol/L — ABNORMAL HIGH (ref 20.0–28.0)
Drawn by: 4547
FIO2: 40
O2 Saturation: 96.2 %
Patient temperature: 37
pCO2, Ven: 69.8 mmHg — ABNORMAL HIGH (ref 44.0–60.0)
pH, Ven: 7.352 (ref 7.250–7.430)
pO2, Ven: 111 mmHg — ABNORMAL HIGH (ref 32.0–45.0)

## 2021-06-18 LAB — CBG MONITORING, ED: Glucose-Capillary: 115 mg/dL — ABNORMAL HIGH (ref 70–99)

## 2021-06-18 LAB — BLOOD GAS, ARTERIAL
Acid-Base Excess: 12.4 mmol/L — ABNORMAL HIGH (ref 0.0–2.0)
Bicarbonate: 39.5 mmol/L — ABNORMAL HIGH (ref 20.0–28.0)
Drawn by: 42783
FIO2: 28
O2 Saturation: 94.3 %
Patient temperature: 37
pCO2 arterial: 89.9 mmHg (ref 32.0–48.0)
pH, Arterial: 7.266 — ABNORMAL LOW (ref 7.350–7.450)
pO2, Arterial: 94 mmHg (ref 83.0–108.0)

## 2021-06-18 LAB — BRAIN NATRIURETIC PEPTIDE: B Natriuretic Peptide: 100.8 pg/mL — ABNORMAL HIGH (ref 0.0–100.0)

## 2021-06-18 LAB — BASIC METABOLIC PANEL
Anion gap: 8 (ref 5–15)
BUN: 49 mg/dL — ABNORMAL HIGH (ref 8–23)
CO2: 40 mmol/L — ABNORMAL HIGH (ref 22–32)
Calcium: 9.1 mg/dL (ref 8.9–10.3)
Chloride: 92 mmol/L — ABNORMAL LOW (ref 98–111)
Creatinine, Ser: 1.93 mg/dL — ABNORMAL HIGH (ref 0.44–1.00)
GFR, Estimated: 27 mL/min — ABNORMAL LOW (ref >60)
Glucose, Bld: 132 mg/dL — ABNORMAL HIGH (ref 70–99)
Potassium: 4.5 mmol/L (ref 3.5–5.1)
Sodium: 140 mmol/L (ref 135–145)

## 2021-06-18 LAB — URINALYSIS, ROUTINE W REFLEX MICROSCOPIC
Bilirubin Urine: NEGATIVE
Glucose, UA: NEGATIVE mg/dL
Hgb urine dipstick: NEGATIVE
Ketones, ur: NEGATIVE mg/dL
Leukocytes,Ua: NEGATIVE
Nitrite: NEGATIVE
Protein, ur: NEGATIVE mg/dL
Specific Gravity, Urine: 1.014 (ref 1.005–1.030)
pH: 5 (ref 5.0–8.0)

## 2021-06-18 LAB — I-STAT VENOUS BLOOD GAS, ED
Acid-Base Excess: 17 mmol/L — ABNORMAL HIGH (ref 0.0–2.0)
Bicarbonate: 45 mmol/L — ABNORMAL HIGH (ref 20.0–28.0)
Calcium, Ion: 0.99 mmol/L — ABNORMAL LOW (ref 1.15–1.40)
HCT: 28 % — ABNORMAL LOW (ref 36.0–46.0)
Hemoglobin: 9.5 g/dL — ABNORMAL LOW (ref 12.0–15.0)
O2 Saturation: 99 %
Potassium: 4.2 mmol/L (ref 3.5–5.1)
Sodium: 138 mmol/L (ref 135–145)
TCO2: 47 mmol/L — ABNORMAL HIGH (ref 22–32)
pCO2, Ven: 76.9 mmHg (ref 44.0–60.0)
pH, Ven: 7.376 (ref 7.250–7.430)
pO2, Ven: 127 mmHg — ABNORMAL HIGH (ref 32.0–45.0)

## 2021-06-18 LAB — HEMOGLOBIN AND HEMATOCRIT, BLOOD
HCT: 30.1 % — ABNORMAL LOW (ref 36.0–46.0)
Hemoglobin: 8.8 g/dL — ABNORMAL LOW (ref 12.0–15.0)

## 2021-06-18 LAB — MAGNESIUM: Magnesium: 2.1 mg/dL (ref 1.7–2.4)

## 2021-06-18 LAB — TYPE AND SCREEN
ABO/RH(D): O POS
Antibody Screen: NEGATIVE

## 2021-06-18 LAB — POC OCCULT BLOOD, ED: Fecal Occult Bld: NEGATIVE

## 2021-06-18 MED ORDER — MAGNESIUM OXIDE -MG SUPPLEMENT 400 (240 MG) MG PO TABS
400.0000 mg | ORAL_TABLET | Freq: Every day | ORAL | Status: DC
  Administered 2021-06-19 – 2021-06-21 (×3): 400 mg via ORAL
  Filled 2021-06-18 (×3): qty 1

## 2021-06-18 MED ORDER — LOSARTAN POTASSIUM 50 MG PO TABS: 50.0000 mg | ORAL_TABLET | Freq: Every day | ORAL | Status: DC

## 2021-06-18 MED ORDER — DILTIAZEM HCL ER COATED BEADS 240 MG PO CP24
240.0000 mg | ORAL_CAPSULE | Freq: Every day | ORAL | Status: DC
  Administered 2021-06-19 – 2021-06-21 (×3): 240 mg via ORAL
  Filled 2021-06-18 (×4): qty 1

## 2021-06-18 MED ORDER — ALBUTEROL SULFATE (2.5 MG/3ML) 0.083% IN NEBU
INHALATION_SOLUTION | RESPIRATORY_TRACT | Status: AC
  Administered 2021-06-18: 2.5 mg
  Filled 2021-06-18: qty 3

## 2021-06-18 MED ORDER — ONDANSETRON HCL 4 MG PO TABS
4.0000 mg | ORAL_TABLET | Freq: Four times a day (QID) | ORAL | Status: DC | PRN
Start: 2021-06-18 — End: 2021-06-18

## 2021-06-18 MED ORDER — SODIUM CHLORIDE 0.9 % IV SOLN: INTRAVENOUS | Status: AC

## 2021-06-18 MED ORDER — SODIUM CHLORIDE 0.9 % IV SOLN: INTRAVENOUS | Status: DC

## 2021-06-18 MED ORDER — DOFETILIDE 125 MCG PO CAPS
125.0000 ug | ORAL_CAPSULE | Freq: Two times a day (BID) | ORAL | Status: DC
  Administered 2021-06-18 – 2021-06-20 (×4): 125 ug via ORAL
  Filled 2021-06-18 (×5): qty 1

## 2021-06-18 MED ORDER — APIXABAN 5 MG PO TABS
5.0000 mg | ORAL_TABLET | Freq: Two times a day (BID) | ORAL | Status: DC
  Administered 2021-06-18 – 2021-06-21 (×6): 5 mg via ORAL
  Filled 2021-06-18 (×6): qty 1

## 2021-06-18 MED ORDER — MONTELUKAST SODIUM 10 MG PO TABS
10.0000 mg | ORAL_TABLET | Freq: Every day | ORAL | Status: DC
  Administered 2021-06-19 – 2021-06-21 (×3): 10 mg via ORAL
  Filled 2021-06-18 (×4): qty 1

## 2021-06-18 MED ORDER — VITAMIN B-12 1000 MCG PO TABS
1000.0000 ug | ORAL_TABLET | Freq: Every day | ORAL | Status: DC
  Administered 2021-06-19 – 2021-06-21 (×3): 1000 ug via ORAL
  Filled 2021-06-18 (×3): qty 1

## 2021-06-18 MED ORDER — ONDANSETRON HCL 4 MG/2ML IJ SOLN
4.0000 mg | Freq: Four times a day (QID) | INTRAMUSCULAR | Status: DC | PRN
Start: 2021-06-18 — End: 2021-06-18

## 2021-06-18 MED ORDER — ATORVASTATIN CALCIUM 40 MG PO TABS
40.0000 mg | ORAL_TABLET | Freq: Every day | ORAL | Status: DC
  Administered 2021-06-19 – 2021-06-21 (×3): 40 mg via ORAL
  Filled 2021-06-18 (×3): qty 1

## 2021-06-18 MED ORDER — ALBUTEROL SULFATE (2.5 MG/3ML) 0.083% IN NEBU
2.5000 mg | INHALATION_SOLUTION | Freq: Once | RESPIRATORY_TRACT | Status: AC
  Administered 2021-06-18: 2.5 mg via RESPIRATORY_TRACT
  Filled 2021-06-18: qty 3

## 2021-06-18 MED ORDER — LEFLUNOMIDE 20 MG PO TABS
20.0000 mg | ORAL_TABLET | Freq: Every day | ORAL | Status: DC
  Administered 2021-06-19 – 2021-06-21 (×3): 20 mg via ORAL
  Filled 2021-06-18 (×4): qty 1

## 2021-06-18 MED ORDER — OXYCODONE HCL 5 MG PO TABS
10.0000 mg | ORAL_TABLET | Freq: Three times a day (TID) | ORAL | Status: DC | PRN
  Filled 2021-06-18: qty 2

## 2021-06-18 MED ORDER — SODIUM CHLORIDE 0.9 % IV BOLUS
500.0000 mL | Freq: Once | INTRAVENOUS | Status: AC
  Administered 2021-06-18: 500 mL via INTRAVENOUS

## 2021-06-18 MED ORDER — SODIUM CHLORIDE 0.9% FLUSH
3.0000 mL | Freq: Two times a day (BID) | INTRAVENOUS | Status: DC
  Administered 2021-06-18 – 2021-06-21 (×6): 3 mL via INTRAVENOUS

## 2021-06-18 MED ORDER — DULOXETINE HCL 30 MG PO CPEP
30.0000 mg | ORAL_CAPSULE | Freq: Every day | ORAL | Status: DC
  Administered 2021-06-19 – 2021-06-21 (×3): 30 mg via ORAL
  Filled 2021-06-18 (×4): qty 1

## 2021-06-18 MED ORDER — TRIAMCINOLONE ACETONIDE 0.1 % EX CREA: 1.0000 "application " | TOPICAL_CREAM | Freq: Two times a day (BID) | CUTANEOUS | Status: DC | PRN

## 2021-06-18 MED ORDER — CHLORHEXIDINE GLUCONATE 0.12 % MT SOLN
15.0000 mL | Freq: Two times a day (BID) | OROMUCOSAL | Status: DC
  Administered 2021-06-18 – 2021-06-19 (×2): 15 mL via OROMUCOSAL
  Filled 2021-06-18 (×2): qty 15

## 2021-06-18 MED ORDER — ORAL CARE MOUTH RINSE
15.0000 mL | Freq: Two times a day (BID) | OROMUCOSAL | Status: DC
  Administered 2021-06-19 (×2): 15 mL via OROMUCOSAL

## 2021-06-18 MED ORDER — ALBUTEROL SULFATE (2.5 MG/3ML) 0.083% IN NEBU
2.5000 mg | INHALATION_SOLUTION | Freq: Four times a day (QID) | RESPIRATORY_TRACT | Status: DC
  Administered 2021-06-18 – 2021-06-19 (×4): 2.5 mg via RESPIRATORY_TRACT
  Filled 2021-06-18 (×4): qty 3

## 2021-06-18 MED ORDER — PANTOPRAZOLE SODIUM 40 MG PO TBEC
40.0000 mg | DELAYED_RELEASE_TABLET | Freq: Every day | ORAL | Status: DC
  Administered 2021-06-19 – 2021-06-21 (×3): 40 mg via ORAL
  Filled 2021-06-18 (×3): qty 1

## 2021-06-18 MED ORDER — PREGABALIN 25 MG PO CAPS
50.0000 mg | ORAL_CAPSULE | Freq: Every day | ORAL | Status: DC
  Administered 2021-06-18 – 2021-06-20 (×3): 50 mg via ORAL
  Filled 2021-06-18 (×3): qty 2

## 2021-06-18 MED ORDER — ACETAMINOPHEN 325 MG PO TABS
650.0000 mg | ORAL_TABLET | Freq: Four times a day (QID) | ORAL | Status: DC | PRN
  Administered 2021-06-19: 650 mg via ORAL
  Filled 2021-06-18: qty 2

## 2021-06-18 MED ORDER — DOFETILIDE 250 MCG PO CAPS: 250.0000 ug | ORAL_CAPSULE | Freq: Two times a day (BID) | ORAL | Status: DC

## 2021-06-18 MED ORDER — METHYLPREDNISOLONE SODIUM SUCC 125 MG IJ SOLR
125.0000 mg | Freq: Once | INTRAMUSCULAR | Status: AC
  Administered 2021-06-18: 125 mg via INTRAVENOUS
  Filled 2021-06-18: qty 2

## 2021-06-18 MED ORDER — LEVOTHYROXINE SODIUM 25 MCG PO TABS
125.0000 ug | ORAL_TABLET | Freq: Every day | ORAL | Status: DC
  Administered 2021-06-19 – 2021-06-21 (×3): 125 ug via ORAL
  Filled 2021-06-18 (×4): qty 1

## 2021-06-18 MED ORDER — ARFORMOTEROL TARTRATE 15 MCG/2ML IN NEBU
15.0000 ug | INHALATION_SOLUTION | Freq: Two times a day (BID) | RESPIRATORY_TRACT | Status: DC
  Administered 2021-06-18 – 2021-06-21 (×6): 15 ug via RESPIRATORY_TRACT
  Filled 2021-06-18 (×6): qty 2

## 2021-06-18 MED ORDER — ACETAMINOPHEN 650 MG RE SUPP: 650.0000 mg | Freq: Four times a day (QID) | RECTAL | Status: DC | PRN

## 2021-06-18 MED ORDER — BUDESONIDE 0.5 MG/2ML IN SUSP
0.5000 mg | Freq: Two times a day (BID) | RESPIRATORY_TRACT | Status: DC
  Administered 2021-06-18 – 2021-06-21 (×6): 0.5 mg via RESPIRATORY_TRACT
  Filled 2021-06-18 (×6): qty 2

## 2021-06-18 NOTE — Progress Notes (Signed)
Cardiology POC noted  Received call that patient had been admitted.  Went upstairs to assist with care.  Patient on BIPAP and minimally responsive.   Daughter notes that prior to sleep study, she has weakness, muscle jerks, and somnolence with increasing lethargy.  Daugther held opioid medication given concerns for AMS.  Worensing led to ED eval  Minimally responsive with asterixis. Course breath sounds on 16/5 IPAP/EPAP.  During exam patient got stuck for ABG.  Discussed with son and daughter; we will see how she does with treatment for hypercapnea.  We should continue AF therapy given risks of stroke and RVR tolerance.  Ideally we will be able to get her back home and doing well, but otherwise we will focus on her comfort.  Discussed with primary MD; we can DC statin.  ARB and Torsemide held with AKI.  We are available as needed.  Riley Lam, MD Cardiologist [facility]  [address] [place], [address] , [place] [address] [phone]  3:52 PM

## 2021-06-18 NOTE — Progress Notes (Signed)
RT attempter x2 to obtain ABG, RT was unsuccessful. Per ED MD, gave verbal order to RT to change ABG order to VBG.

## 2021-06-18 NOTE — Telephone Encounter (Signed)
I spoke with patient's daughter who is calling to let Dr Izora Ribas know patient has been admitted to the hospital

## 2021-06-18 NOTE — ED Notes (Signed)
Critical pCO2 value reported to Dr. Criss Alvine

## 2021-06-18 NOTE — Telephone Encounter (Signed)
Paged by daughter [place] that [name] is in ER for evaluation of weakness and tremors. I have recommended to have ER physician to complete their work up and they will call cardiology if needed any assistance.

## 2021-06-18 NOTE — ED Provider Notes (Signed)
[facility] EMERGENCY DEPARTMENT Provider Note   CSN: [id number] Arrival date & time: 06/17/21  2206     History Chief Complaint  Patient presents with   Tremors    [name] is a 75 y.o. female.  HPI 75 year old female presents with generalized weakness with difficulty getting into her car.  She was due for sleep study last night but when she got there she was having increased tremors and was diffusely weak to the point she could not get up onto the bed or into the car.  She been having progressive dyspnea for quite some time.  She has congestive heart failure as well as chronic respiratory failure on 2 L.  Due to increased fluid, her cardiologist increased her Lasix from 20 mg daily to 40 mg twice daily about 5 days ago.  Leg swelling has dramatically improved.  Patient however now is more weak and daughter is worried she is dehydrated.  She has also been sleeping more over the last few weeks and seems somewhat confused like when she has had increased CO2.  She has had to be on BiPAP for this.  The patient has noted increased tremors for about 2 weeks in all 4 extremities with some intermittent jerking, worse with movements.  She wonders if Tikosyn is involved as she was put on this about 3 weeks ago.  Past Medical History:  Diagnosis Date   Arthritis    OA RIGHT KNEE WITH PAIN   Barrett esophagus    Bradycardia 06/01/2015   CAD in native artery    a. NSTEMI 05/2015 s/p emergent CABG.   Chronic diastolic (congestive) heart failure (HCC)    Chronic kidney disease, stage 3a (HCC)    Chronic respiratory failure (HCC)    Chronic respiratory failure with hypoxia and hypercapnia (HCC) 02/04/2010   Followed in Pulmonary clinic/ [facility]/ [facility]       - 02 dependent  since 07/02/10 >>  83% RA December 05, 2010       - ONO RA 08/05/12  :  Positive sat < 89 x 2:59m> repeat on 2lpm rec 08/12/2012  - 06/17/2013 reported desat with activity p Knee surgery > rec  restart 2lpm with activity  - 06/27/2013   Walked 2lpm  x one lap @ 185 stopped due to sat 88% not sob , desat to 82% on RA just at th   COPD III spirometry if use FEV1/VC p saba  07/18/2010   Quit smoking May 2006       - PFT's  04/12/10 FEV1  1.21 (69%) ratio 77 and no change p B2,  DLC0 56%   VC 70%         - PFTs  08/08/2013 FEV1 1.21 (60%) ratio 86 and no change p B2 DLCO 79%  VC 72%  On symbicort 160 2bid  - PFT's  02/08/2018  FEV1 0.70 (40 % ) ratio 56 if use FEV1/VC  p 38 % improvement from saba p symb 160 prior to study with DLCO  78 % corrects to 147  % for alv volume   - 02/08/2018   Cough variant asthma 02/26/2011   Followed in Pulmonary clinic/ [facility]/ [facility]  - PFT's  06/04/15  FEV1 1.20 (67 % ) ratio 83  p 6 % improvement from saba with DLCO  80 % corrects to 132 % for alv volume      - Clinical dx based on response to symbicort  FENO 09/16/2016  =   96 on symbicort 160 2bid > added singulair  Allergy profile 09/16/2016 >  Eos 0.5 /  IgE  78 neg RAST  -  Referred to rehab 04/29/2017 > completed   Essential hypertension 04/20/2007   Qualifier: Diagnosis of  By: Marcelyn Ditty, RN, Katy Fitch    GERD (gastroesophageal reflux disease)    History of ARDS 2006   History of home oxygen therapy    AT NIGHT WHEN SLEEPING 2 L / MIN NASAL CANNULA   Hyperlipidemia 07/12/2015   Hypothyroidism    Intracranial hemorrhage (HCC) 2019   a. small intracranial hemorrhage in setting of HTN.   Mild carotid artery disease (HCC)    a. Duplex 1-39% bilaterally in 2016.   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   Followed as Primary Care Patient/  Healthcare/ [facility]  -s/p ARDS 2006 with bacteremic S  Pna       - CT  chest 07/03/10 Nonspecific PF mostly upper lobes       - CT chest 12/03/10 acute gg changes and effusions c/w chf - PFT's  02/08/2018  FVC 0.64 (28 %)   with DLCO  78 % corrects to 147 % for alv volume      Rheumatic disease    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   SOB (shortness of breath) on exertion     Patient Active Problem List   Diagnosis Date Noted   Acute on chronic respiratory failure with hypercapnia (HCC) 06/18/2021   Acute on chronic respiratory failure with hypoxia and hypercapnia (HCC) 06/18/2021   Persistent atrial fibrillation (HCC) 02/05/2021   Chronic heart failure with preserved ejection fraction (HCC) 01/07/2021   Stage 3b chronic kidney disease (HCC) 01/07/2021   Paroxysmal atrial fibrillation (HCC) 01/03/2021   History of COVID-19 12/27/2020   Sciatica 12/27/2020   Diarrhea 12/27/2020   Atrial flutter (HCC) 12/25/2020   Secondary hypercoagulable state (HCC) 12/25/2020   Chronic respiratory failure with hypoxia and hypercapnia (HCC) 04/20/2019   Severe episode of recurrent major depressive disorder, without psychotic features (HCC) 11/09/2018   COPD (chronic obstructive pulmonary disease) (HCC) 11/02/2018   COPD with acute exacerbation (HCC) 11/01/2018   CAP (community acquired pneumonia) 10/28/2018   ICH (intracerebral hemorrhage) (HCC) 08/25/2018   Hyperlipidemia 07/12/2015   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   Bradycardia 06/01/2015   Morbid (severe) obesity due to excess calories (HCC) 04/22/2015   Postoperative anemia due to acute blood loss 06/08/2013   History of home oxygen therapy 06/07/2013   GERD (gastroesophageal reflux disease) 06/07/2013   Barrett's esophagus 06/07/2013   OA (osteoarthritis) of knee 06/06/2013   COPD III spirometry if use FEV1/VC p saba  07/18/2010   PULMONARY FIBROSIS ILD POST INFLAMMATORY CHRONIC 07/18/2010   Compression fracture of thoracic vertebra (HCC) 07/02/2010   Hypothyroidism 04/20/2007   Essential hypertension 04/20/2007   PSORIATIC ARTHRITIS  04/20/2007    Past Surgical History:  Procedure Laterality Date   ABDOMINOPLASTY     CARDIAC CATHETERIZATION N/A 06/01/2015   Procedure: Left Heart Cath and Coronary Angiography;  Surgeon: Runell Gess, MD;  Location: [facility] INVASIVE CV LAB;  Service: Cardiovascular;  Laterality: N/A;   CARDIOVERSION N/A 02/12/2021  Procedure: CARDIOVERSION;  Surgeon: Meriam Sprague, MD;  Location: [facility] ENDOSCOPY;  Service: Cardiovascular;  Laterality: N/A;   CARPAL TUNNEL RELEASE     CHOLECYSTECTOMY     CORONARY ARTERY BYPASS GRAFT N/A 06/04/2015   Procedure: CORONARY ARTERY BYPASS GRAFT times three            with left internal mammary artery and right leg saphenous vein;  Surgeon: Alleen Borne, MD;  Location: [facility] OR;  Service: Open Heart Surgery;  Laterality: N/A;   cosmetic breast surgery     JOINT REPLACEMENT     KNEE ARTHROSCOPY Left    TEE WITHOUT CARDIOVERSION  06/04/2015   Procedure: TRANSESOPHAGEAL ECHOCARDIOGRAM (TEE);  Surgeon: Alleen Borne, MD;  Location: [facility] OR;  Service: Open Heart Surgery;;   TEE WITHOUT CARDIOVERSION N/A 02/12/2021   Procedure: TRANSESOPHAGEAL ECHOCARDIOGRAM (TEE);  Surgeon: Meriam Sprague, MD;  Location: [facility] ENDOSCOPY;  Service: Cardiovascular;  Laterality: N/A;   TOTAL KNEE ARTHROPLASTY Left    TOTAL KNEE ARTHROPLASTY Right 06/06/2013   Procedure: RIGHT TOTAL KNEE ARTHROPLASTY;  Surgeon: Loanne Drilling, MD;  Location: [facility] ORS;  Service: Orthopedics;  Laterality: Right;     OB History   No obstetric history on file.     Family History  Problem Relation Age of Onset   Breast cancer Mother    Coronary artery disease Father    Rheum arthritis Father     Social History   Tobacco Use   Smoking status: Former    Packs/day: 1.50    Years: 58.50    Pack years: 87.75    Types: Cigarettes    Quit date: 03/03/2005    Years since quitting: 16.3   Smokeless tobacco: Never  Vaping Use   Vaping Use: Never used  Substance Use Topics   Alcohol use: No   Drug  use: No    Home Medications Prior to Admission medications   Medication Sig Start Date End Date Taking? Authorizing Provider  acetaminophen (TYLENOL) 650 MG CR tablet Take 650 mg by mouth every 8 (eight) hours.    [provider]  albuterol (VENTOLIN HFA) 108 (90 Base) MCG/ACT inhaler Inhale 2 puffs into the lungs every 4 (four) hours as needed (shortness of breath, if you can't catch your breath). 06/04/20   Nyoka Cowden, MD  amoxicillin (AMOXIL) 500 MG tablet Take 1,000 mg by mouth See admin instructions. Dental procedure    [provider]  apixaban (ELIQUIS) 5 MG TABS tablet Take 1 tablet (5 mg total) by mouth 2 (two) times daily. 06/17/21   Fenton, Clint R, PA  atorvastatin (LIPITOR) 40 MG tablet TAKE 1 TABLET BY MOUTH  DAILY AT 6 PM 03/14/21   Chandrasekhar, Mahesh A, MD  budesonide (PULMICORT) 0.25 MG/2ML nebulizer solution One twice daily 07/02/20   Nyoka Cowden, MD  CALCIUM PO Take 1 tablet by mouth daily.    [provider]  Cholecalciferol (VITAMIN D3) 2000 UNITS TABS Take 2,000 Int'l Units by mouth daily.    [provider]  clotrimazole-betamethasone (LOTRISONE) cream Apply 1 application topically 2 (two) times daily as needed. 05/29/21   [place], Betty G, MD  diltiazem (CARDIZEM CD) 240 MG 24 hr capsule Take 1 capsule (240 mg total) by mouth daily. 12/06/20   Christell Constant, MD  dofetilide (TIKOSYN) 250 MCG capsule Take 1 capsule (250 mcg total) by mouth 2 (two) times daily. 04/10/21   Fenton, Clint R, PA  DULoxetine (CYMBALTA) 30 MG capsule Take  1 capsule (30 mg total) by mouth daily. 03/14/21   [name], [name], MD  ferrous sulfate 325 (65 FE) MG tablet Take 1 tablet (325 mg total) by mouth daily with breakfast. 01/10/21 02/09/21  Riley Lam A, MD  formoterol (PERFOROMIST) 20 MCG/2ML nebulizer solution Take 2 mLs (20 mcg total) by nebulization 2 (two) times daily. Use in nebulizer twice daily perfectly regularly 07/02/20    Nyoka Cowden, MD  golimumab (SIMPONI ARIA) 50 MG/4ML SOLN injection Inject 50 mg into the vein every 8 (eight) weeks.     [provider]  ketoconazole (NIZORAL) 2 % cream Apply 1 application topically daily as needed for irritation. 01/30/21   [provider]  leflunomide (ARAVA) 20 MG tablet Take 1 tablet (20 mg total) by mouth daily. 03/15/19   [name], [name], MD  levothyroxine (SYNTHROID) 125 MCG tablet TAKE 1 TABLET BY MOUTH EVERY DAY 06/20/20   [name], [name], MD  losartan (COZAAR) 50 MG tablet Take 1 tablet (50 mg total) by mouth daily. 01/17/21   Christell Constant, MD  Magnesium Oxide 400 MG CAPS Take 1 capsule (400 mg total) by mouth daily. 06/06/21   Chandrasekhar, Mahesh A, MD  montelukast (SINGULAIR) 10 MG tablet Take 1 tablet by mouth daily. 03/22/21   [provider]  nitroGLYCERIN (NITROSTAT) 0.4 MG SL tablet Place 1 tablet (0.4 mg total) under the tongue every 5 (five) minutes as needed for chest pain. 05/24/21   Riley Lam A, MD  nystatin (MYCOSTATIN/NYSTOP) powder APPLY 1 APPLICATION        TOPICALLY 3 TIMES A DAY 03/07/21   [name], [name], MD  oxyCODONE (OXY IR/ROXICODONE) 5 MG immediate release tablet Take 10 mg by mouth in the morning and at bedtime. 04/08/21   [provider]  OXYGEN Inhale 2 L into the lungs continuous. continuous o2    [provider]  pantoprazole (PROTONIX) 40 MG tablet Take 1 tablet (40 mg total) by mouth daily before breakfast. 01/10/21   Nyoka Cowden, MD  pregabalin (LYRICA) 50 MG capsule Take 50 mg by mouth at bedtime. 03/13/21   [provider]  torsemide (DEMADEX) 20 MG tablet Take 2 tablets (40 mg total) by mouth 2 (two) times daily. 06/14/21   Riley Lam A, MD  triamcinolone cream (KENALOG) 0.1 % Apply 1 application topically 2 (two) times daily as needed (for psoriasis).     [provider]  umeclidinium bromide (INCRUSE ELLIPTA)  62.5 MCG/INH AEPB Inhale 1 puff into the lungs daily. 03/14/21   Nyoka Cowden, MD  vitamin B-12 (CYANOCOBALAMIN) 1000 MCG tablet Take 1,000 mcg by mouth daily.    [provider]    Allergies    Gabapentin  Review of Systems   Review of Systems  Constitutional:  Negative for fever.  Respiratory:  Positive for shortness of breath.   Cardiovascular:  Negative for chest pain and leg swelling.  Neurological:  Positive for tremors and weakness.  All other systems reviewed and are negative.  Physical Exam Updated Vital Signs BP (!) 111/96   Pulse 84   Temp 98.2 F (36.8 C) (Oral)   Resp (!) 21   SpO2 98%   Physical Exam Vitals and nursing note reviewed.  Constitutional:      Appearance: She is well-developed. She is obese. She is not diaphoretic.  HENT:     Head: Normocephalic and atraumatic.     Right Ear: External ear normal.  Left Ear: External ear normal.     Nose: Nose normal.  Eyes:     General:        Right eye: No discharge.        Left eye: No discharge.  Cardiovascular:     Rate and Rhythm: Normal rate and regular rhythm.     Heart sounds: Normal heart sounds.  Pulmonary:     Effort: Pulmonary effort is normal. No tachypnea, accessory muscle usage or respiratory distress.     Breath sounds: Examination of the right-lower field reveals rales. Examination of the left-lower field reveals rales. Wheezing (mild, diffuse) and rales (mild) present.  Abdominal:     Palpations: Abdomen is soft.     Tenderness: There is no abdominal tenderness.  Musculoskeletal:     Right lower leg: No edema.     Left lower leg: No edema.  Skin:    General: Skin is warm and dry.  Neurological:     Mental Status: She is alert.     Comments: Patient is awake and alert and able to speak to me and provide the history.  She has equal strength in all 4 extremities, though a little limited in her left lower extremity due to pain in her hip from chronic hip pain. When I am done  talking to her however she will fall asleep quickly and has to be lightly physically touched to wake up.  Psychiatric:        Mood and Affect: Mood is not anxious.    ED Results / Procedures / Treatments   Labs (all labs ordered are listed, but only abnormal results are displayed) Labs Reviewed  CBC WITH DIFFERENTIAL/PLATELET - Abnormal; Notable for the following components:      Result Value   WBC 10.8 (*)    RBC 3.49 (*)    Hemoglobin 9.7 (*)    HCT 33.7 (*)    MCHC 28.8 (*)    Neutro Abs 7.8 (*)    Monocytes Absolute 1.1 (*)    All other components within normal limits  BASIC METABOLIC PANEL - Abnormal; Notable for the following components:   Chloride 86 (*)    CO2 39 (*)    Glucose, Bld 135 (*)    BUN 51 (*)    Creatinine, Ser 2.39 (*)    GFR, Estimated 21 (*)    All other components within normal limits  URINALYSIS, ROUTINE W REFLEX MICROSCOPIC - Abnormal; Notable for the following components:   APPearance HAZY (*)    All other components within normal limits  BLOOD GAS, ARTERIAL - Abnormal; Notable for the following components:   pH, Arterial 7.266 (*)    pCO2 arterial 89.9 (*)    Bicarbonate 39.5 (*)    Acid-Base Excess 12.4 (*)    All other components within normal limits  CBG MONITORING, ED - Abnormal; Notable for the following components:   Glucose-Capillary 115 (*)    All other components within normal limits  RESP PANEL BY RT-PCR (FLU A&B, COVID) ARPGX2  BRAIN NATRIURETIC PEPTIDE    EKG EKG Interpretation  Date/Time:  Tuesday June 18 2021 10:24:19 EDT Ventricular Rate:  82 PR Interval:  206 QRS Duration: 101 QT Interval:  415 QTC Calculation: 485 R Axis:   77 Text Interpretation: Sinus rhythm Supraventricular bigeminy Low voltage, precordial leads RSR' in V1 or V2, probably normal variant Abnormal inferior Q waves  ST/T changes similar to June 2022 Confirmed by Pricilla Loveless [phone]) on 06/18/2021 10:26:26 AM  Radiology DG Chest 1 View  Result  Date: 06/17/2021 CLINICAL DATA:  Chronic tremors EXAM: CHEST  1 VIEW COMPARISON:  06/05/2021 FINDINGS: Cardiac shadow is within normal limits. Postsurgical changes are noted. Mild interstitial changes are again noted consistent with vascular congestion and edema. No sizable effusion is noted. No new focal abnormality is seen. IMPRESSION: Stable vascular congestion and mild interstitial edema. Electronically Signed   By: Alcide Clever M.D.   On: 06/17/2021 22:54    Procedures .Critical Care  Date/Time: 06/18/2021 10:13 AM Performed by: Pricilla Loveless, MD Authorized by: Pricilla Loveless, MD   Critical care provider statement:    Critical care time (minutes):  35   Critical care time was exclusive of:  Separately billable procedures and treating other patients   Critical care was necessary to treat or prevent imminent or life-threatening deterioration of the following conditions:  Respiratory failure and renal failure   Critical care was time spent personally by me on the following activities:  Discussions with consultants, evaluation of patient's response to treatment, examination of patient, ordering and performing treatments and interventions, ordering and review of laboratory studies, ordering and review of radiographic studies, pulse oximetry, re-evaluation of patient's condition, obtaining history from patient or surrogate and review of old charts   Medications Ordered in ED Medications  sodium chloride flush (NS) 0.9 % injection 3 mL (has no administration in time range)  acetaminophen (TYLENOL) tablet 650 mg (has no administration in time range)    Or  acetaminophen (TYLENOL) suppository 650 mg (has no administration in time range)  ondansetron (ZOFRAN) tablet 4 mg (has no administration in time range)    Or  ondansetron (ZOFRAN) injection 4 mg (has no administration in time range)  sodium chloride 0.9 % bolus 500 mL (500 mLs Intravenous New Bag/Given 06/18/21 0835)  albuterol (PROVENTIL)  (2.5 MG/3ML) 0.083% nebulizer solution 2.5 mg (2.5 mg Nebulization Given 06/18/21 0856)    ED Course  I have reviewed the triage vital signs and the nursing notes.  Pertinent labs & imaging results that were available during my care of the patient were reviewed by me and considered in my medical decision making (see chart for details).    MDM Rules/Calculators/A&P                           Patient's acute kidney injury is likely causing her generalized weakness though her hypercarbic respiratory failure is also likely contributing.  She was found to have respiratory acidosis and was placed on BiPAP.  The kidney injury is likely from the increased Lasix and with the kidney injury she may have more sedation from some of her medicines.  She is still easily awoken while on the BiPAP so I have low suspicion she will need intubation.  She will be given gentle fluids.  She will need admission and I have discussed with Dr. Katrinka Blazing. Final Clinical Impression(s) / ED Diagnoses Final diagnoses:  Acute on chronic respiratory failure with hypercapnia (HCC)  Acute kidney injury [facility])    Rx / DC Orders ED Discharge Orders     None        Pricilla Loveless, MD 06/18/21 1026

## 2021-06-18 NOTE — H&P (Addendum)
71 History and Physical    [name] XTG:[id number] DOB: [birth year] DOA: 06/17/2021  Referring MD/NP/PA: Pricilla Loveless, MD PCP: [name], [name], MD  Consultants: Dr. Rafael Bihari Dr. [facility]ul[facility] Patient coming from: Home  Chief Complaint: Weakness and jerks  I have personally briefly reviewed patient's old medical records in [facility]   HPI: [name] is a 75 y.o. female with medical history significant of CAD s/p CABG, HFpEF, atrial fibrillation on COPD, ILD on home O2 who presents with complaints of increased weakness with upper and lower extremity jerks.  She had went to have a sleep study and was unable to get in the bed to have the study, or back into the car after the study has been postponed.  Patient's daughter helps provide additional history as she is currently on BiPAP.  Apparently, the patient had been having intermittent jerking of her upper and lower extremities for about 1 month.  Jerking episodes were usually brought on with movement and worsening over the last 1-2 weeks.  Symptoms suspect to have started after several medication changes have been made for which patient notes Eliquis, Tikosyn, hydrocodone, and depression medication changes because the previous did not agree with Tikosyn.  Patient had been seen by her cardiologist on 8/11 and at that time her torsemide had been increased from 20 mg daily to 40 mg twice daily. Daughter notes associated symptoms shortness of breath with exertion that seems to have worsened, increased lethargy, and increased cough.  Patient denied any complaints of fever.    Since issues with her right hip she has been not able to ambulate on her own.   Her sleep study had been performed and attempts to get the patient to surgery for her right hip so that she would be able to be ambulatory again.  Patient confirms that she would like to remain a DNR, but is okay with BiPAP and is amendable to  intubation if thought to be likely temporary.  ED Course: Upon admission into the emergency department patient was seen to be afebrile, pulse 90-1 34, respirations 16-19, blood pressures maintained, and O2 saturations initially maintained on 2-3 L of nasal cannula oxygen.  ABG revealed pH of 7.266 with PCO2 89.9, PO2 94.  Patient was transitioned to BiPAP.  Labs significant for WBC 10.8, hemoglobin 9.7, BUN 51, and creatinine 2.39.  Influenza and COVID-19 screening were both negative.  Patient had been given 500 mL normal saline IV fluids and albuterol neb.  Review of Systems  Unable to perform ROS: Severe respiratory distress  Constitutional:  Positive for malaise/fatigue. Negative for fever.  HENT:  Negative for congestion and nosebleeds.   Eyes:  Negative for photophobia and pain.  Respiratory:  Positive for cough, shortness of breath and wheezing.   Cardiovascular:  Negative for chest pain and leg swelling.  Gastrointestinal:  Negative for abdominal pain, nausea and vomiting.  Genitourinary:  Negative for dysuria and hematuria.  Musculoskeletal:  Positive for joint pain. Negative for falls.  Neurological:  Positive for tremors and weakness.  Psychiatric/Behavioral:  Negative for substance abuse.        Positive for confusion   Past Medical History:  Diagnosis Date   Arthritis    OA RIGHT KNEE WITH PAIN   Barrett esophagus    Bradycardia 06/01/2015   CAD in native artery    a. NSTEMI 05/2015 s/p emergent CABG.   Chronic diastolic (congestive) heart failure (HCC)    Chronic kidney disease, stage 3a (  HCC)    Chronic respiratory failure (HCC)    Chronic respiratory failure with hypoxia and hypercapnia (HCC) 02/04/2010   Followed in Pulmonary clinic/ [facility]/ [facility]       - 02 dependent  since 07/02/10 >>  83% RA December 05, 2010       - ONO RA 08/05/12  :  Positive sat < 89 x 2:72m> repeat on 2lpm rec 08/12/2012  - 06/17/2013 reported desat with activity p Knee surgery > rec restart  2lpm with activity  - 06/27/2013   Walked 2lpm  x one lap @ 185 stopped due to sat 88% not sob , desat to 82% on RA just at th   COPD III spirometry if use FEV1/VC p saba  07/18/2010   Quit smoking May 2006       - PFT's  04/12/10 FEV1  1.21 (69%) ratio 77 and no change p B2,  DLC0 56%   VC 70%         - PFTs  08/08/2013 FEV1 1.21 (60%) ratio 86 and no change p B2 DLCO 79%  VC 72%  On symbicort 160 2bid  - PFT's  02/08/2018  FEV1 0.70 (40 % ) ratio 56 if use FEV1/VC  p 38 % improvement from saba p symb 160 prior to study with DLCO  78 % corrects to 147  % for alv volume   - 02/08/2018   Cough variant asthma 02/26/2011   Followed in Pulmonary clinic/ [facility]/ [facility]  - PFT's  06/04/15  FEV1 1.20 (67 % ) ratio 83  p 6 % improvement from saba with DLCO  80 % corrects to 132 % for alv volume      - Clinical dx based on response to symbicort       FENO 09/16/2016  =   96 on symbicort 160 2bid > added singulair  Allergy profile 09/16/2016 >  Eos 0.5 /  IgE  78 neg RAST  -  Referred to rehab 04/29/2017 > completed   Essential hypertension 04/20/2007   Qualifier: Diagnosis of  By: Marcelyn Ditty, RN, Katy Fitch    GERD (gastroesophageal reflux disease)    History of ARDS 2006   History of home oxygen therapy    AT NIGHT WHEN SLEEPING 2 L / MIN NASAL CANNULA   Hyperlipidemia 07/12/2015   Hypothyroidism    Intracranial hemorrhage (HCC) 2019   a. small intracranial hemorrhage in setting of HTN.   Mild carotid artery disease (HCC)    a. Duplex 1-39% bilaterally in 2016.   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   Followed as Primary Care Patient/ [facility]/ [facility]  -s/p ARDS 2006 with bacteremic S  Pna       - CT chest  07/03/10 Nonspecific PF mostly upper lobes       - CT chest 12/03/10 acute gg changes and effusions c/w chf - PFT's  02/08/2018  FVC 0.64 (28 %)   with DLCO  78 % corrects to 147 % for alv volume      Rheumatic disease    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   SOB (shortness of breath) on exertion     Past Surgical  History:  Procedure Laterality Date   ABDOMINOPLASTY     CARDIAC CATHETERIZATION N/A 06/01/2015   Procedure: Left Heart Cath and Coronary Angiography;  Surgeon: Runell Gess, MD;  Location: [facility] INVASIVE CV LAB;  Service: Cardiovascular;  Laterality: N/A;   CARDIOVERSION N/A 02/12/2021   Procedure: CARDIOVERSION;  Surgeon: Meriam Sprague, MD;  Location: [facility] ENDOSCOPY;  Service: Cardiovascular;  Laterality: N/A;   CARPAL TUNNEL RELEASE     CHOLECYSTECTOMY     CORONARY ARTERY BYPASS GRAFT N/A 06/04/2015   Procedure: CORONARY ARTERY BYPASS GRAFT times three            with left internal mammary artery and right leg saphenous vein;  Surgeon: Alleen Borne, MD;  Location: [facility] OR;  Service: Open Heart Surgery;  Laterality: N/A;   cosmetic breast surgery     JOINT REPLACEMENT     KNEE ARTHROSCOPY Left    TEE WITHOUT CARDIOVERSION  06/04/2015   Procedure: TRANSESOPHAGEAL ECHOCARDIOGRAM (TEE);  Surgeon: Alleen Borne, MD;  Location: [facility] OR;  Service: Open Heart Surgery;;   TEE WITHOUT CARDIOVERSION N/A 02/12/2021   Procedure: TRANSESOPHAGEAL ECHOCARDIOGRAM (TEE);  Surgeon: Meriam Sprague, MD;  Location: [facility] ENDOSCOPY;  Service: Cardiovascular;  Laterality: N/A;   TOTAL KNEE ARTHROPLASTY Left    TOTAL KNEE ARTHROPLASTY Right 06/06/2013   Procedure: RIGHT TOTAL KNEE ARTHROPLASTY;  Surgeon: Loanne Drilling, MD;  Location: [facility] ORS;  Service: Orthopedics;  Laterality: Right;     reports that she quit smoking about 16 years ago. Her smoking use included cigarettes. She has a 87.75 pack-year smoking history. She has never used smokeless tobacco. She reports that she does not drink alcohol and does not use  drugs.  Allergies  Allergen Reactions   Gabapentin Other (See Comments)    Dizziness, lighthead    Family History  Problem Relation Age of Onset   Breast cancer Mother    Coronary artery disease Father    Rheum arthritis Father     Prior to Admission medications   Medication Sig Start Date End Date Taking? Authorizing Provider  acetaminophen (TYLENOL) 650 MG CR tablet Take 650 mg by mouth every 8 (eight) hours.    [provider]  albuterol (VENTOLIN HFA) 108 (90 Base) MCG/ACT inhaler Inhale 2 puffs into the lungs every 4 (four) hours as needed (shortness of breath, if you can't catch your breath). 06/04/20   Nyoka Cowden, MD  amoxicillin (AMOXIL) 500 MG tablet Take 1,000 mg by mouth See admin instructions. Dental procedure    [provider]  apixaban (ELIQUIS) 5 MG TABS tablet Take 1 tablet (5 mg total) by mouth 2 (two) times daily. 06/17/21   Fenton, Clint R, PA  atorvastatin (LIPITOR) 40 MG tablet TAKE 1 TABLET BY MOUTH  DAILY AT 6 PM 03/14/21   Chandrasekhar, Mahesh A, MD  budesonide (PULMICORT) 0.25 MG/2ML nebulizer solution One twice daily 07/02/20   Nyoka Cowden, MD  CALCIUM PO Take 1 tablet by mouth daily.    [provider]  Cholecalciferol (VITAMIN D3) 2000 UNITS TABS Take 2,000 Int'l Units by mouth daily.    [provider]  clotrimazole-betamethasone (LOTRISONE) cream Apply 1 application topically 2 (two) times daily as needed. 05/29/21   [place], Betty G, MD  diltiazem (CARDIZEM CD) 240 MG 24 hr capsule Take 1 capsule (240 mg total) by mouth daily. 12/06/20   Christell Constant, MD  dofetilide (TIKOSYN) 250 MCG capsule Take 1 capsule (250 mcg total) by mouth  2 (two) times daily. 04/10/21   Fenton, Clint R, PA  DULoxetine (CYMBALTA) 30 MG capsule Take 1 capsule (30 mg total) by mouth daily. 03/14/21   [name], [name], MD  ferrous sulfate 325 (65 FE) MG tablet Take 1 tablet (325 mg total) by mouth daily with breakfast. 01/10/21  02/09/21  Riley Lam A, MD  formoterol (PERFOROMIST) 20 MCG/2ML nebulizer solution Take 2 mLs (20 mcg total) by nebulization 2 (two) times daily. Use in nebulizer twice daily perfectly regularly 07/02/20   Nyoka Cowden, MD  golimumab (SIMPONI ARIA) 50 MG/4ML SOLN injection Inject 50 mg into the vein every 8 (eight) weeks.     [provider]  ketoconazole (NIZORAL) 2 % cream Apply 1 application topically daily as needed for irritation. 01/30/21   [provider]  leflunomide (ARAVA) 20 MG tablet Take 1 tablet (20 mg total) by mouth daily. 03/15/19   [name], [name], MD  levothyroxine (SYNTHROID) 125 MCG tablet TAKE 1 TABLET BY MOUTH EVERY DAY 06/20/20   [name], [name], MD  losartan (COZAAR) 50 MG tablet Take 1 tablet (50 mg total) by mouth daily. 01/17/21   Christell Constant, MD  Magnesium Oxide 400 MG CAPS Take 1 capsule (400 mg total) by mouth daily. 06/06/21   Chandrasekhar, Mahesh A, MD  montelukast (SINGULAIR) 10 MG tablet Take 1 tablet by mouth daily. 03/22/21   [provider]  nitroGLYCERIN (NITROSTAT) 0.4 MG SL tablet Place 1 tablet (0.4 mg total) under the tongue every 5 (five) minutes as needed for chest pain. 05/24/21   Riley Lam A, MD  nystatin (MYCOSTATIN/NYSTOP) powder APPLY 1 APPLICATION        TOPICALLY 3 TIMES A DAY 03/07/21   [name], [name], MD  oxyCODONE (OXY IR/ROXICODONE) 5 MG immediate release tablet Take 10 mg by mouth in the morning and at bedtime. 04/08/21   [provider]  OXYGEN Inhale 2 L into the lungs continuous. continuous o2    [provider]  pantoprazole (PROTONIX) 40 MG tablet Take 1 tablet (40 mg total) by mouth daily before breakfast. 01/10/21   Nyoka Cowden, MD  pregabalin (LYRICA) 50 MG capsule Take 50 mg by mouth at bedtime. 03/13/21   [provider]  torsemide (DEMADEX) 20 MG tablet Take 2 tablets (40 mg total) by mouth 2 (two) times daily. 06/14/21    Riley Lam A, MD  triamcinolone cream (KENALOG) 0.1 % Apply 1 application topically 2 (two) times daily as needed (for psoriasis).     [provider]  umeclidinium bromide (INCRUSE ELLIPTA) 62.5 MCG/INH AEPB Inhale 1 puff into the lungs daily. 03/14/21   Nyoka Cowden, MD  vitamin B-12 (CYANOCOBALAMIN) 1000 MCG tablet Take 1,000 mcg by mouth daily.    [provider]    Physical Exam:  Constitutional: Morbidly obese female who is lethargic but we will awaken to verbal command Vitals:   06/18/21 0830 06/18/21 0845 06/18/21 0900 06/18/21 0930  BP: (!) 122/45  135/61   Pulse: 87 86 84 82  Resp: 19 18 19 16   Temp:      TempSrc:      SpO2: 93% 97% 94% 98%   Eyes: PERRL, lids and conjunctivae normal ENMT: Mucous membranes are moist. Posterior pharynx clear of any exudate or lesions.  Neck: normal, supple.  No JVD Respiratory: Intermittent mild expiratory wheezes and crackles appreciated.  Patient currently on BiPAP able to talk in almost complete sentences. Cardiovascular:  Regular rate and rhythm, no murmurs / rubs / gallops. No extremity edema.   Abdomen: no tenderness, no masses palpated. No hepatosplenomegaly. Bowel sounds positive.  Musculoskeletal: No cyanosis and no joint deformity noted of the upper extremities on physical exam. Skin: no rashes, lesions, ulcers. No induration Neurologic: CN 2-12 grossly intact.  Intermittent jerks noted of the upper and lower extremity. Psychiatric: Lethargic, but oriented x3 at this time    Labs on Admission: I have personally reviewed following labs and imaging studies  CBC: Recent Labs  Lab 06/17/21 2235  WBC 10.8*  NEUTROABS 7.8*  HGB 9.7*  HCT 33.7*  MCV 96.6  PLT 226   Basic Metabolic Panel: Recent Labs  Lab 06/13/21 1303 06/17/21 2235  NA 142 137  K 4.2 4.4  CL 90* 86*  CO2 38* 39*  GLUCOSE 122* 135*  BUN 29* 51*  CREATININE 1.30* 2.39*  CALCIUM 9.7 9.3  MG 2.2  --    GFR: Estimated  Creatinine Clearance: 20.5 mL/min (A) (by C-G formula based on SCr of 2.39 mg/dL (H)). Liver Function Tests: No results for input(s): AST, ALT, ALKPHOS, BILITOT, PROT, ALBUMIN in the last 168 hours. No results for input(s): LIPASE, AMYLASE in the last 168 hours. No results for input(s): AMMONIA in the last 168 hours. Coagulation Profile: No results for input(s): INR, PROTIME in the last 168 hours. Cardiac Enzymes: No results for input(s): CKTOTAL, CKMB, CKMBINDEX, TROPONINI in the last 168 hours. BNP (last 3 results) Recent Labs    06/13/21 1303  PROBNP 588*   HbA1C: No results for input(s): HGBA1C in the last 72 hours. CBG: Recent Labs  Lab 06/18/21 0839  GLUCAP 115*   Lipid Profile: No results for input(s): CHOL, HDL, LDLCALC, TRIG, CHOLHDL, LDLDIRECT in the last 72 hours. Thyroid Function Tests: No results for input(s): TSH, T4TOTAL, FREET4, T3FREE, THYROIDAB in the last 72 hours. Anemia Panel: No results for input(s): VITAMINB12, FOLATE, FERRITIN, TIBC, IRON, RETICCTPCT in the last 72 hours. Urine analysis:    Component Value Date/Time   COLORURINE YELLOW 06/18/2021 0457   APPEARANCEUR HAZY (A) 06/18/2021 0457   LABSPEC 1.014 06/18/2021 0457   PHURINE 5.0 06/18/2021 0457   GLUCOSEU NEGATIVE 06/18/2021 0457   GLUCOSEU NEGATIVE 05/29/2021 1557   HGBUR NEGATIVE 06/18/2021 0457   HGBUR negative 07/18/2010 0941   BILIRUBINUR NEGATIVE 06/18/2021 0457   BILIRUBINUR n 08/10/2012 1131   KETONESUR NEGATIVE 06/18/2021 0457   PROTEINUR NEGATIVE 06/18/2021 0457   UROBILINOGEN 0.2 05/29/2021 1557   NITRITE NEGATIVE 06/18/2021 0457   LEUKOCYTESUR NEGATIVE 06/18/2021 0457   Sepsis Labs: Recent Results (from the past 240 hour(s))  Resp Panel by RT-PCR (Flu A&B, Covid) Nasopharyngeal Swab     Status: None   Collection Time: 06/17/21 10:47 PM   Specimen: Nasopharyngeal Swab; Nasopharyngeal(NP) swabs in vial transport medium  Result Value Ref Range Status   SARS Coronavirus 2 by  RT PCR NEGATIVE NEGATIVE Final    Comment: (NOTE) SARS-CoV-2 target nucleic acids are NOT DETECTED.  The SARS-CoV-2 RNA is generally detectable in upper respiratory specimens during the acute phase of infection. The lowest concentration of SARS-CoV-2 viral copies this assay can detect is 138 copies/mL. A negative result does not preclude SARS-Cov-2 infection and should not be used as the sole basis for treatment or other patient management decisions. A negative result may occur with  improper specimen collection/handling, submission of specimen other than nasopharyngeal swab, presence of viral mutation(s) within the areas targeted by this assay, and inadequate  number of viral copies(<138 copies/mL). A negative result must be combined with clinical observations, patient history, and epidemiological information. The expected result is Negative.  Fact Sheet for Patients:  BloggerCourse.com  Fact Sheet for Healthcare Providers:  SeriousBroker.it  This test is no t yet approved or cleared by the [place] FDA and  has been authorized for detection and/or diagnosis of SARS-CoV-2 by FDA under an Emergency Use Authorization (EUA). This EUA will remain  in effect (meaning this test can be used) for the duration of the COVID-19 declaration under Section 564(b)(1) of the Act, 21 U.S.C.section 360bbb-3(b)(1), unless the authorization is terminated  or revoked sooner.       Influenza A by PCR NEGATIVE NEGATIVE Final   Influenza B by PCR NEGATIVE NEGATIVE Final    Comment: (NOTE) The Xpert Xpress SARS-CoV-2/FLU/RSV plus assay is intended as an aid in the diagnosis of influenza from Nasopharyngeal swab specimens and should not be used as a sole basis for treatment. Nasal washings and aspirates are unacceptable for Xpert Xpress SARS-CoV-2/FLU/RSV testing.  Fact Sheet for Patients: BloggerCourse.com  Fact Sheet  for Healthcare Providers: SeriousBroker.it  This test is not yet approved or cleared by the [place] FDA and has been authorized for detection and/or diagnosis of SARS-CoV-2 by FDA under an Emergency Use Authorization (EUA). This EUA will remain in effect (meaning this test can be used) for the duration of the COVID-19 declaration under Section 564(b)(1) of the Act, 21 U.S.C. section 360bbb-3(b)(1), unless the authorization is terminated or revoked.  Performed at [facility], [address], [place], [place] [address]      Radiological Exams on Admission: DG Chest 1 View  Result Date: 06/17/2021 CLINICAL DATA:  Chronic tremors EXAM: CHEST  1 VIEW COMPARISON:  06/05/2021 FINDINGS: Cardiac shadow is within normal limits. Postsurgical changes are noted. Mild interstitial changes are again noted consistent with vascular congestion and edema. No sizable effusion is noted. No new focal abnormality is seen. IMPRESSION: Stable vascular congestion and mild interstitial edema. Electronically Signed   By: Alcide Clever M.D.   On: 06/17/2021 22:54    EKG: Independently reviewed.  Sinus rhythm at 82 bpm with supraventricular bigeminy  Assessment/Plan Acute on chronic respiratory failure with hypercapnia secondary to COPD exacerbation/ILD: Initial ABG significant for pH 7.266 with PCO2 89.9 and PO2 94.  Patient noted to have some expiratory wheezes on physical exam.  Chest x-ray noted stable vascular congestion with mild interstitial edema.   -Admit to a progressive bed -Continuous pulse oximetry with oxygen to maintain O2 sats greater than 92% -BiPAP -N.p.o. while on BiPAP and resume home meds once able -Recheck ABG -Brovana and budesonide nebs -Albuterol nebs 4 times daily -Solu-Medrol IV 125 mg IV x1 dose, reassess in a.m. determine if patient can be transitioned to p.o.  Acute kidney injury superimposed on chronic kidney disease: Patient presents with  creatinine elevated up to 2.39 with BUN 51.   Baseline creatinine had been around 1-1.3.  Suspect prerenal cause of symptoms in the setting of increased diuretic.  Patient had been given 500 mL of normal saline IV fluids. -Strict I&Os -Gentle normal saline IV fluid 75 mL/h -Hold nephrotoxic agents -Recheck kidney function daily  Leukocytosis: WBC 10.8 no clear source of infection appreciated. -Recheck CBC tomorrow morning  Myoclonic jerks generalized weakness: Acute.  Patient noted to have significant jerking of the upper and lower extremities jerks noted on physical exam.  Discussed with neurology who suspect medications like oxycodone and  Lyrica could be possible causes of her symptoms especially in the setting of acute kidney injury. -Pharmacy consult for possible drug drug interactions -PT/OT to eval and treat tomorrow morning -Transitions of care consulted for possible need of placement  Heart failure with preserved ejection fraction: BNP was down to 100.8 from 588 on 8/18.  Chest x-ray did note concern for mild interstitial pulmonary edema. At this time patient does not appear to be fluid overloaded. -Daily weights -Holding torsemide due to AKI  Normocytic anemia: Acute on chronic.  Hemoglobin 9.7 g/dL, but baseline previously had been around 10-11. -Type and screen for possible need of blood -Check repeat H&H  Essential hypertension blood pressures currently maintained -Hold losartan secondary to AKI  Chronic pain secondary to osteoarthritis of the right hip -Continue Lyrica and oxycodone as needed, but daughter updated about this possibly being a cause for myoclonic jerks -Follow-up with orthopedics in the outpatient setting  Paroxysmal atrial flutter/fibrillation:CHA2DS2-VASc score = 7 -Continue Eliquis, Tikosyn(reduced dose 125 bid), losartan, and diltiazem  Coronary artery disease: Patient with prior history of CABG in 2016.  Not on beta-blocker due to COPD or Ranexa due  to QTC. -Continue statin  Rheumatoid arthritis: Patient is on golimumab injections -Continue outpatient follow-up with rheumatology  Depression: Patient was switched from Celexa to Cymbalta. -Continue Cymbalta  Hypothyroidism -Check TSH -Continue levothyroxine  Hyperlipidemia -Continue atorvastatin  Morbid obesity: Last BMI 41.2 kg/m  DNR: Present on admission.  Patient is okay with intubation if needed only temporarily.  DVT prophylaxis: Eliquis, but will hold if hemoglobin continues to trend down Code Status: DNR(okay with intubation if temporary) Family Communication: Daughter updated at bedside Disposition Plan: To be determined Consults called: None Admission status: Inpatient, require more than 2 midnight stay  Clydie Braun MD [facility]   If 7PM-7AM, please contact night-coverage   06/18/2021, 9:49 AM

## 2021-06-18 NOTE — Telephone Encounter (Signed)
Patient's daughter states the patient is currently admitted to the hospital, due to the increase in her torsemide. She states they are now decreasing the torsemide and they are giving her fluid now. She would like a call back.

## 2021-06-18 NOTE — Progress Notes (Signed)
RT assisted with transportation of this pt while on BiPAP from ED35 to 6E05 with no complications and SVS.

## 2021-06-19 ENCOUNTER — Inpatient Hospital Stay (HOSPITAL_COMMUNITY): Payer: Medicare Other

## 2021-06-19 LAB — COMPREHENSIVE METABOLIC PANEL
ALT: 13 U/L (ref 0–44)
AST: 20 U/L (ref 15–41)
Albumin: 3 g/dL — ABNORMAL LOW (ref 3.5–5.0)
Alkaline Phosphatase: 68 U/L (ref 38–126)
Anion gap: 6 (ref 5–15)
BUN: 45 mg/dL — ABNORMAL HIGH (ref 8–23)
CO2: 40 mmol/L — ABNORMAL HIGH (ref 22–32)
Calcium: 9.4 mg/dL (ref 8.9–10.3)
Chloride: 96 mmol/L — ABNORMAL LOW (ref 98–111)
Creatinine, Ser: 1.51 mg/dL — ABNORMAL HIGH (ref 0.44–1.00)
GFR, Estimated: 36 mL/min — ABNORMAL LOW (ref >60)
Glucose, Bld: 163 mg/dL — ABNORMAL HIGH (ref 70–99)
Potassium: 5.2 mmol/L — ABNORMAL HIGH (ref 3.5–5.1)
Sodium: 142 mmol/L (ref 135–145)
Total Bilirubin: 0.6 mg/dL (ref 0.3–1.2)
Total Protein: 6 g/dL — ABNORMAL LOW (ref 6.5–8.1)

## 2021-06-19 LAB — CBC
HCT: 31 % — ABNORMAL LOW (ref 36.0–46.0)
Hemoglobin: 9.1 g/dL — ABNORMAL LOW (ref 12.0–15.0)
MCH: 28.2 pg (ref 26.0–34.0)
MCHC: 29.4 g/dL — ABNORMAL LOW (ref 30.0–36.0)
MCV: 96 fL (ref 80.0–100.0)
Platelets: 190 10*3/uL (ref 150–400)
RBC: 3.23 MIL/uL — ABNORMAL LOW (ref 3.87–5.11)
RDW: 14.2 % (ref 11.5–15.5)
WBC: 5.7 10*3/uL (ref 4.0–10.5)
nRBC: 0 % (ref 0.0–0.2)

## 2021-06-19 LAB — POTASSIUM: Potassium: 4.1 mmol/L (ref 3.5–5.1)

## 2021-06-19 LAB — AMMONIA: Ammonia: 32 umol/L (ref 9–35)

## 2021-06-19 LAB — MAGNESIUM: Magnesium: 2.2 mg/dL (ref 1.7–2.4)

## 2021-06-19 MED ORDER — ALBUTEROL SULFATE (2.5 MG/3ML) 0.083% IN NEBU
2.5000 mg | INHALATION_SOLUTION | Freq: Two times a day (BID) | RESPIRATORY_TRACT | Status: DC
  Administered 2021-06-19 – 2021-06-21 (×4): 2.5 mg via RESPIRATORY_TRACT
  Filled 2021-06-19 (×4): qty 3

## 2021-06-19 MED ORDER — ALBUTEROL SULFATE (2.5 MG/3ML) 0.083% IN NEBU: 2.5000 mg | INHALATION_SOLUTION | RESPIRATORY_TRACT | Status: DC | PRN

## 2021-06-19 NOTE — Evaluation (Signed)
Physical Therapy Evaluation Patient Details Name: [name] MRN: [id number] DOB: [birth year] Today's Date: 06/19/2021   History of Present Illness  75 y.o. female presents to [facility] on 06/17/2021 with weakness and asterixis. Pt's daughter also reports recent increase in weakness, SOB and lethargy. PMH includes CAD s/p CABG, HFpEF, atrial fibrillation on COPD, ILD on home O2.  Clinical Impression  Pt presents to PT with deficits in activity tolerance, power, strength, balance, and with L hip and knee pain which limits mobility. Pt has been limited to pivot transfers and wheelchair mobility for the past few months due to LLE pain and weakness. Pt is able to demonstrate the ability to perform bed mobility and transfer with limited PT assistance at this time. Pt and family in agreement for discharge home with HHPT and continued assistance from family for safety. Pt will benefit from continued acute PT services to improve mobility quality and reduce falls risk.    Follow Up Recommendations Home health PT;Supervision/Assistance - 24 hour    Equipment Recommendations  None recommended by PT (pt owns necessary DME)    Recommendations for Other Services       Precautions / Restrictions Precautions Precautions: Fall Restrictions Weight Bearing Restrictions: No      Mobility  Bed Mobility Overal bed mobility: Needs Assistance Bed Mobility: Supine to Sit     Supine to sit: Min assist;HOB elevated     General bed mobility comments: hand hold to pull into sitting    Transfers Overall transfer level: Needs assistance Equipment used: 1 person hand held assist Transfers: Squat Pivot Transfers     Squat pivot transfers: Min guard     General transfer comment: pt leans anteriorly, utilizing armrest of recliner and PT UE support for stability to squat and pivot  Ambulation/Gait                Stairs            Wheelchair Mobility    Modified Rankin (Stroke  Patients Only)       Balance Overall balance assessment: Needs assistance Sitting-balance support: Feet supported;No upper extremity supported Sitting balance-[name]: Good     Standing balance support: Single extremity supported;Bilateral upper extremity supported Standing balance-[name]: Poor Standing balance comment: reliant on UE support                             Pertinent Vitals/Pain Pain Assessment: Faces Faces Pain Scale: Hurts even more Pain Location: L knee and hip Pain Descriptors / Indicators: Grimacing Pain Intervention(s): Monitored during session    Home Living Family/patient expects to be discharged to:: Private residence Living Arrangements: Children Available Help at Discharge: Family;Available 24 hours/day Type of Home: House Home Access: Stairs to enter (threshhold) Entrance Stairs-Rails: None Entrance Stairs-Number of Steps: 1 Home Layout: Multi-level (stair lift) Home Equipment: Bedside commode;Shower seat;Grab bars - tub/shower;Hand held shower head;Transport chair;Wheelchair - manual      Prior Function Level of Independence: Needs assistance   Gait / Transfers Assistance Needed: pt performs squat pivot transfers with UE support of furniture/railing/family  ADL's / Homemaking Assistance Needed: family assist with shower transfers and bathing as well as dressing  Comments: pt has been mobilizing at a wheelchair level for the past 2 months per pt and daughter report     Hand Dominance   Dominant Hand: Right    Extremity/Trunk Assessment   Upper Extremity Assessment Upper Extremity Assessment: Generalized  weakness    Lower Extremity Assessment Lower Extremity Assessment: Generalized weakness    Cervical / Trunk Assessment Cervical / Trunk Assessment: Normal  Communication   Communication: No difficulties  Cognition Arousal/Alertness: Awake/alert Behavior During Therapy: WFL for tasks assessed/performed Overall  Cognitive Status: Within Functional Limits for tasks assessed                                        General Comments General comments (skin integrity, edema, etc.): pt on 3L [place] during session, tachy to 120s with mobility    Exercises     Assessment/Plan    PT Assessment Patient needs continued PT services  PT Problem List Decreased strength;Decreased activity tolerance;Decreased balance;Decreased mobility;Pain       PT Treatment Interventions DME instruction;Gait training;Functional mobility training;Therapeutic activities;Therapeutic exercise;Balance training;Patient/family education;Wheelchair mobility training    PT Goals (Current goals can be found in the Care Plan section)  Acute Rehab PT Goals Patient Stated Goal: to go home and to walk again PT Goal Formulation: With patient Time For Goal Achievement: 07/03/21 Potential to Achieve Goals: Fair Additional Goals Additional Goal #1: Pt will mobilize in a manual wheelchair for 100' at a supervision level    Frequency Min 3X/week   Barriers to discharge        Co-evaluation               AM-PAC PT "6 Clicks" Mobility  Outcome Measure Help needed turning from your back to your side while in a flat bed without using bedrails?: A Little Help needed moving from lying on your back to sitting on the side of a flat bed without using bedrails?: A Little Help needed moving to and from a bed to a chair (including a wheelchair)?: A Little Help needed standing up from a chair using your arms (e.g., wheelchair or bedside chair)?: A Little Help needed to walk in hospital room?: A Lot Help needed climbing 3-5 steps with a railing? : Total 6 Click Score: 15    End of Session Equipment Utilized During Treatment: Oxygen Activity Tolerance: Patient tolerated treatment well Patient left: in chair;with call bell/phone within reach;with chair alarm set;with family/visitor present Nurse Communication: Mobility  status PT Visit Diagnosis: Other abnormalities of gait and mobility (R26.89);Muscle weakness (generalized) (M62.81);Pain Pain - Right/Left: Left Pain - part of body: Hip    Time: 6433-2951 PT Time Calculation (min) (ACUTE ONLY): 34 min   Charges:   PT Evaluation $PT Eval Low Complexity: 1 Low          Arlyss Gandy, PT, DPT Acute Rehabilitation Pager: [phone]   Arlyss Gandy 06/19/2021, 4:10 PM

## 2021-06-19 NOTE — Evaluation (Signed)
Occupational Therapy Evaluation Patient Details Name: [name] MRN: [id number] DOB: [birth year] Today's Date: 06/19/2021    History of Present Illness [name] is a 75 y.o. female with medical history significant of CAD s/p CABG, HFpEF, atrial fibrillation on COPD, ILD on home O2 who presents with complaints of increased weakness with upper and lower extremity jerks.  She had went to have a sleep study and was unable to get in the bed to have the study, or back into the car after the study has been postponed.  Patient's daughter helps provide additional history as she is currently on BiPAP.   Clinical Impression   Pt lives with family who assist with  care. Pt. Has had a decline with her mobility and ADLs and would benefit from skilled OT to increase ability to care for herself. Pt. Has chronic hip pain that has limited her mobility since march. She was a one person assist and now she is requiring 2 person assist with mobility. Acute ot to follow.     Follow Up Recommendations  Home health OT;Supervision/Assistance - 24 hour;SNF (depends on how much assist family is able to provide at home.)    Equipment Recommendations  None recommended by OT    Recommendations for Other Services       Precautions / Restrictions Precautions Precautions: Fall Precaution Comments: pt. reports no recent falls. Restrictions Weight Bearing Restrictions: No      Mobility Bed Mobility Overal bed mobility: Needs Assistance Bed Mobility: Supine to Sit;Sit to Supine     Supine to sit: Mod assist Sit to supine: Mod assist   General bed mobility comments: cues for hand placement    Transfers Overall transfer level: Needs assistance Equipment used: 2 person hand held assist Transfers: Sit to/from Stand Sit to Stand: Max assist;+2 physical assistance         General transfer comment: 1 side step to head of bed.    Balance Overall balance assessment: Needs assistance    Sitting balance-[name]: Fair       Standing balance-[name]: Poor                             ADL either performed or assessed with clinical judgement   ADL Overall ADL's : Needs assistance/impaired Eating/Feeding: Independent   Grooming: Wash/dry hands;Wash/dry face;Oral care;Set up;Sitting   Upper Body Bathing: Minimal assistance;Sitting   Lower Body Bathing: Maximal assistance;+2 for physical assistance;Sit to/from stand   Upper Body Dressing : Minimal assistance;Sitting   Lower Body Dressing: Maximal assistance;+2 for physical assistance;Sit to/from Database administrator:  (unable to person assist to side step to head of bed 1 step at max a level.)           Functional mobility during ADLs: Maximal assistance;+2 for physical assistance General ADL Comments: Pt. was able to sit eob for adls but has decreased ability to preform and would beneift from instruction on use of AE.     Vision Baseline Vision/History: Wears glasses Wears Glasses: At all times Patient Visual Report: No change from baseline Vision Assessment?: No apparent visual deficits     Perception     Praxis      Pertinent Vitals/Pain Pain Assessment: No/denies pain     Hand Dominance Right   Extremity/Trunk Assessment Upper Extremity Assessment Upper Extremity Assessment: Generalized weakness   Lower Extremity Assessment Lower Extremity Assessment: Defer to PT evaluation  Communication Communication Communication: No difficulties   Cognition Arousal/Alertness: Awake/alert Behavior During Therapy: WFL for tasks assessed/performed Overall Cognitive Status: Within Functional Limits for tasks assessed                                     General Comments       Exercises     Shoulder Instructions      Home Living Family/patient expects to be discharged to:: Private residence Living Arrangements: Children Available Help at Discharge:  Family;Available PRN/intermittently Type of Home: House Home Access: Stairs to enter (pt.has a wc lift in garage and in home secondary it is a split level.)     Home Layout: Multi-level (has wc lift.)     Bathroom Shower/Tub: Producer, television/film/video: Standard     Home Equipment: Bedside commode;Shower seat;Grab bars - tub/shower;Hand held shower head;Transport chair          Prior Functioning/Environment Level of Independence: Needs assistance    ADL's / Homemaking Assistance Needed: Pt. dtr assists with ADLs at Mod a level.   Comments: pt. has been wc level since march secondary to hip pain. Pt. dtr assisted with stand pivot.        OT Problem List: Decreased activity tolerance;Impaired balance (sitting and/or standing);Decreased knowledge of use of DME or AE;Decreased knowledge of precautions;Pain      OT Treatment/Interventions: Self-care/ADL training;DME and/or AE instruction;Therapeutic activities;Patient/family education    OT Goals(Current goals can be found in the care plan section) Acute Rehab OT Goals Patient Stated Goal: get better OT Goal Formulation: With patient Time For Goal Achievement: 07/03/21 Potential to Achieve Goals: Good  OT Frequency: Min 2X/week   Barriers to D/C:            Co-evaluation              AM-PAC OT "6 Clicks" Daily Activity     Outcome Measure Help from another person eating meals?: None Help from another person taking care of personal grooming?: A Little Help from another person toileting, which includes using toliet, bedpan, or urinal?: A Lot Help from another person bathing (including washing, rinsing, drying)?: A Lot Help from another person to put on and taking off regular upper body clothing?: A Little Help from another person to put on and taking off regular lower body clothing?: A Lot 6 Click Score: 16   End of Session Nurse Communication:  (ok therapy)  Activity Tolerance: Patient limited by pain  (pt. has hip pain that limits ability wtih standing and transfers.) Patient left: in bed;with call bell/phone within reach;with bed alarm set;with family/visitor present  OT Visit Diagnosis: Unsteadiness on feet (R26.81);Other abnormalities of gait and mobility (R26.89);Pain                Time: 7782-4235 OT Time Calculation (min): 30 min Charges:  OT General Charges $OT Visit: 1 Visit OT Evaluation $OT Eval Moderate Complexity: 1 Mod  Derrek Gu OT/L   Jymir Dunaj 06/19/2021, 12:59 PM

## 2021-06-19 NOTE — Progress Notes (Signed)
Patient trialed off BIPAP at this time and placed on 2L [place]. Patient tolerating well at this time. Patient informed to call if she felt like she was getting SOB again. BIPAP left in room on stand by.

## 2021-06-19 NOTE — Progress Notes (Signed)
PROGRESS NOTE    [name]  ONG:[id number] DOB: 1946/04/01 DOA: 06/17/2021 PCP: [name], [name], MD   Brief Narrative: [name] is a 75 y.o. female with a history of CAD status post CABG, diastolic heart failure, atrial fibrillation, COPD, interstitial lung disease on home O2.  Patient presented secondary to increased weakness and upper/lower extremity jerking.  Patient was found to have severe hypercapnia requiring BiPAP.  She had some mental status changes well which improved with BiPAP therapy.   Assessment & Plan:   Principal Problem:   Acute on chronic respiratory failure with hypoxia and hypercapnia (HCC) Active Problems:   Hypothyroidism   Essential hypertension   PULMONARY FIBROSIS ILD POST INFLAMMATORY CHRONIC   Morbid (severe) obesity due to excess calories (HCC)   S/P CABG x 3   Hyperlipidemia   Severe episode of recurrent major depressive disorder, without psychotic features (HCC)   Paroxysmal atrial fibrillation (HCC)   Chronic heart failure with preserved ejection fraction (HCC)   Leukocytosis   DNR (do not resuscitate)   Acute on chronic respiratory failure with hypoxia and hypercapnia Patient is on 2 to 3 L of oxygen as an outpatient.  Patient required BiPAP on admission for a's PCO2 of 89.9 and a pH of 7.266.  Patient has a history of CO2 retention from recent hospitalization back in February 2022.  Patient was planning to have a sleep study prior to admission.  Currently appears to be at baseline. -Continue oxygen -ABG in AM; hold BiPAP tonight  Acute metabolic encephalopathy Secondary to hypercarbia. Resolved.  AKI on CKD stage IIIb Baseline creatinine of about 1.3.  Creatinine on admission of 2.39 and is trended down with holding torsemide and IV hydration.  Leukocytosis Mild. Resolved. No evidence of infection.  Myoclonic jerking Possibly secondary to Lyrica, although she is on a very low dose. Possibly related to  hypercapnia. Resolved.  Chronic diastolic heart failure Patient is on torsemide 40 mg twice daily as an outpatient which was held secondary to AKI.  Normocytic anemia Acute on chronic anemia Stable.  Primary hypertension Patient is on losartan, diltiazem as an outpatient -Continue losartan and diltiazem  COPD Does not appear to have acute exacerbation.  Patient is on Pulmicort, formoterol, Incruse Ellipta, albuterol, Singulair as an outpatient.  Chronic pain Patient is on Lyrica and oxycodone as an outpatient. -Continue Lyrica and oxycodone  Paroxysmal atrial fibrillation/flutter Patient is on Tikosyn, diltiazem as an outpatient.  On Eliquis as an outpatient. -Continue Tikosyn and diltiazem -Continue Eliquis -Keep potassium greater than 4 and magnesium greater than 2  CAD History of NSTEMI s/p CABG. On Lipitor as an outpatient  Rheumatoid arthritis Patient is on golimumab as an outpatient  Depression Patient is on Cymbalta as an outpatient. -Continue Cymbalta  Hypothyroidism Patient is on Synthroid 125 mcg daily -Continue Synthroid 25 mcg daily  Morbid obesity Body mass index is 42.75 kg/m.   DVT prophylaxis: Eliquis Code Status:   Code Status: DNR Family Communication: Daughter at bedside Disposition Plan: Discharge home likely in 24 hours if safe discharge otherwise may need SNF discharge   Consultants:  None  Procedures:  BiPAP  Antimicrobials: None   Subjective: Feels well. No concerns. No chest pain or dyspnea.  Objective: Vitals:   06/19/21 0818 06/19/21 1142 06/19/21 1245 06/19/21 1246  BP:   134/81 134/81  Pulse:   96 96  Resp:   19 19  Temp:   98.5 F (36.9 C) 98.5 F (36.9 C)  TempSrc:   Oral   SpO2: 95% 97%    Weight:        Intake/Output Summary (Last 24 hours) at 06/19/2021 1652 Last data filed at 06/19/2021 0644 Gross per 24 hour  Intake 630.5 ml  Output 650 ml  Net -19.5 ml   Filed Weights   06/19/21 0600  Weight: 96  kg    Examination:  General exam: Appears calm and comfortable Respiratory system: Rales. Respiratory effort normal. Cardiovascular system: S1 & S2 heard, RRR. No murmurs, rubs, gallops or clicks. Gastrointestinal system: Abdomen is nondistended, soft and nontender. No organomegaly or masses felt. Normal bowel sounds heard. Central nervous system: Alert and oriented. No focal neurological deficits. Musculoskeletal: No edema. No calf tenderness Skin: No cyanosis. No rashes Psychiatry: Judgement and insight appear normal. Mood & affect appropriate.     Data Reviewed: I have personally reviewed following labs and imaging studies  CBC Lab Results  Component Value Date   WBC 5.7 06/19/2021   RBC 3.23 (L) 06/19/2021   HGB 9.1 (L) 06/19/2021   HCT 31.0 (L) 06/19/2021   MCV 96.0 06/19/2021   MCH 28.2 06/19/2021   PLT 190 06/19/2021   MCHC 29.4 (L) 06/19/2021   RDW 14.2 06/19/2021   LYMPHSABS 1.5 06/17/2021   MONOABS 1.1 (H) 06/17/2021   EOSABS 0.4 06/17/2021   BASOSABS 0.1 06/17/2021     Last metabolic panel Lab Results  Component Value Date   NA 142 06/19/2021   K 4.1 06/19/2021   CL 96 (L) 06/19/2021   CO2 40 (H) 06/19/2021   BUN 45 (H) 06/19/2021   CREATININE 1.51 (H) 06/19/2021   GLUCOSE 163 (H) 06/19/2021   GFRNONAA 36 (L) 06/19/2021   GFRAA 56 (L) 06/08/2020   CALCIUM 9.4 06/19/2021   PHOS 4.1 12/30/2020   PROT 6.0 (L) 06/19/2021   ALBUMIN 3.0 (L) 06/19/2021   LABGLOB 2.6 01/18/2020   AGRATIO 1.6 01/18/2020   BILITOT 0.6 06/19/2021   ALKPHOS 68 06/19/2021   AST 20 06/19/2021   ALT 13 06/19/2021   ANIONGAP 6 06/19/2021    CBG (last 3)  Recent Labs    06/18/21 0839  GLUCAP 115*     GFR: Estimated Creatinine Clearance: 33.2 mL/min (A) (by C-G formula based on SCr of 1.51 mg/dL (H)).  Coagulation Profile: No results for input(s): INR, PROTIME in the last 168 hours.  Recent Results (from the past 240 hour(s))  Resp Panel by RT-PCR (Flu A&B, Covid)  Nasopharyngeal Swab     Status: None   Collection Time: 06/17/21 10:47 PM   Specimen: Nasopharyngeal Swab; Nasopharyngeal(NP) swabs in vial transport medium  Result Value Ref Range Status   SARS Coronavirus 2 by RT PCR NEGATIVE NEGATIVE Final    Comment: (NOTE) SARS-CoV-2 target nucleic acids are NOT DETECTED.  The SARS-CoV-2 RNA is generally detectable in upper respiratory specimens during the acute phase of infection. The lowest concentration of SARS-CoV-2 viral copies this assay can detect is 138 copies/mL. A negative result does not preclude SARS-Cov-2 infection and should not be used as the sole basis for treatment or other patient management decisions. A negative result may occur with  improper specimen collection/handling, submission of specimen other than nasopharyngeal swab, presence of viral mutation(s) within the areas targeted by this assay, and inadequate number of viral copies(<138 copies/mL). A negative result must be combined with clinical observations, patient history, and epidemiological information. The expected result is Negative.  Fact Sheet for Patients:  BloggerCourse.com  Fact Sheet for  Healthcare Providers:  SeriousBroker.it  This test is no t yet approved or cleared by the [place] and  has been authorized for detection and/or diagnosis of SARS-CoV-2 by FDA under an Emergency Use Authorization (EUA). This EUA will remain  in effect (meaning this test can be used) for the duration of the COVID-19 declaration under Section 564(b)(1) of the Act, 21 U.S.C.section 360bbb-3(b)(1), unless the authorization is terminated  or revoked sooner.       Influenza A by PCR NEGATIVE NEGATIVE Final   Influenza B by PCR NEGATIVE NEGATIVE Final    Comment: (NOTE) The Xpert Xpress SARS-CoV-2/FLU/RSV plus assay is intended as an aid in the diagnosis of influenza from Nasopharyngeal swab specimens and should not be  used as a sole basis for treatment. Nasal washings and aspirates are unacceptable for Xpert Xpress SARS-CoV-2/FLU/RSV testing.  Fact Sheet for Patients: BloggerCourse.com  Fact Sheet for Healthcare Providers: SeriousBroker.it  This test is not yet approved or cleared by the [place] FDA and has been authorized for detection and/or diagnosis of SARS-CoV-2 by FDA under an Emergency Use Authorization (EUA). This EUA will remain in effect (meaning this test can be used) for the duration of the COVID-19 declaration under Section 564(b)(1) of the Act, 21 U.S.C. section 360bbb-3(b)(1), unless the authorization is terminated or revoked.  Performed at [facility], [address], [place], [place] [address]         Radiology Studies: DG Chest 1 View  Result Date: 06/17/2021 CLINICAL DATA:  Chronic tremors EXAM: CHEST  1 VIEW COMPARISON:  06/05/2021 FINDINGS: Cardiac shadow is within normal limits. Postsurgical changes are noted. Mild interstitial changes are again noted consistent with vascular congestion and edema. No sizable effusion is noted. No new focal abnormality is seen. IMPRESSION: Stable vascular congestion and mild interstitial edema. Electronically Signed   By: Alcide Clever M.D.   On: 06/17/2021 22:54        Scheduled Meds:  albuterol  2.5 mg Nebulization BID   apixaban  5 mg Oral BID   arformoterol  15 mcg Nebulization BID   atorvastatin  40 mg Oral Daily   budesonide (PULMICORT) nebulizer solution  0.5 mg Nebulization BID   chlorhexidine  15 mL Mouth Rinse BID   diltiazem  240 mg Oral Daily   dofetilide  125 mcg Oral BID   DULoxetine  30 mg Oral Daily   leflunomide  20 mg Oral Daily   levothyroxine  125 mcg Oral QAC breakfast   magnesium oxide  400 mg Oral Daily   mouth rinse  15 mL Mouth Rinse q12n4p   montelukast  10 mg Oral Daily   pantoprazole  40 mg Oral QAC breakfast   pregabalin  50 mg Oral QHS    sodium chloride flush  3 mL Intravenous Q12H   vitamin B-12  1,000 mcg Oral Daily   Continuous Infusions:   LOS: 1 day     Jacquelin Hawking, MD [facility] 06/19/2021, 4:52 PM  If 7PM-7AM, please contact night-coverage [email or web address]

## 2021-06-19 NOTE — Telephone Encounter (Signed)
Spoke with Dr. Izora Ribas in regards to daughter concerns.  He expressed that he rounded on pt inpatient and spoke with pt and daughter.  No further follow up needed at this time.

## 2021-06-20 ENCOUNTER — Encounter (HOSPITAL_COMMUNITY): Payer: Self-pay | Admitting: Internal Medicine

## 2021-06-20 LAB — BASIC METABOLIC PANEL
Anion gap: 7 (ref 5–15)
BUN: 47 mg/dL — ABNORMAL HIGH (ref 8–23)
CO2: 40 mmol/L — ABNORMAL HIGH (ref 22–32)
Calcium: 9.7 mg/dL (ref 8.9–10.3)
Chloride: 94 mmol/L — ABNORMAL LOW (ref 98–111)
Creatinine, Ser: 1.37 mg/dL — ABNORMAL HIGH (ref 0.44–1.00)
GFR, Estimated: 41 mL/min — ABNORMAL LOW (ref >60)
Glucose, Bld: 108 mg/dL — ABNORMAL HIGH (ref 70–99)
Potassium: 4.3 mmol/L (ref 3.5–5.1)
Sodium: 141 mmol/L (ref 135–145)

## 2021-06-20 LAB — BLOOD GAS, ARTERIAL
Acid-Base Excess: 10.2 mmol/L — ABNORMAL HIGH (ref 0.0–2.0)
Bicarbonate: 36.2 mmol/L — ABNORMAL HIGH (ref 20.0–28.0)
Drawn by: 38235
FIO2: 32
O2 Saturation: 95.7 %
Patient temperature: 37
pCO2 arterial: 70.4 mmHg (ref 32.0–48.0)
pH, Arterial: 7.331 — ABNORMAL LOW (ref 7.350–7.450)
pO2, Arterial: 105 mmHg (ref 83.0–108.0)

## 2021-06-20 LAB — MRSA NEXT GEN BY PCR, NASAL: MRSA by PCR Next Gen: DETECTED — AB

## 2021-06-20 MED ORDER — DOFETILIDE 250 MCG PO CAPS
250.0000 ug | ORAL_CAPSULE | Freq: Two times a day (BID) | ORAL | Status: DC
  Administered 2021-06-20 – 2021-06-21 (×2): 250 ug via ORAL
  Filled 2021-06-20 (×2): qty 1

## 2021-06-20 MED ORDER — CHLORHEXIDINE GLUCONATE CLOTH 2 % EX PADS
6.0000 | MEDICATED_PAD | Freq: Every day | CUTANEOUS | Status: DC
  Administered 2021-06-21: 6 via TOPICAL

## 2021-06-20 MED ORDER — LORAZEPAM 0.5 MG PO TABS: 0.5000 mg | ORAL_TABLET | Freq: Every evening | ORAL | Status: DC | PRN

## 2021-06-20 MED ORDER — MUPIROCIN 2 % EX OINT
1.0000 "application " | TOPICAL_OINTMENT | Freq: Two times a day (BID) | CUTANEOUS | Status: DC
  Administered 2021-06-20 – 2021-06-21 (×2): 1 via NASAL
  Filled 2021-06-20: qty 22

## 2021-06-20 MED ORDER — TRAZODONE HCL 50 MG PO TABS
25.0000 mg | ORAL_TABLET | Freq: Every evening | ORAL | Status: DC | PRN
  Administered 2021-06-20: 25 mg via ORAL
  Filled 2021-06-20: qty 1

## 2021-06-20 NOTE — Progress Notes (Signed)
PROGRESS NOTE    [name]  ZOX:[id number] DOB: 19-May-1946 DOA: 06/17/2021 PCP: [name], [name], MD   Brief Narrative: [name] is a 75 y.o. female with a history of CAD status post CABG, diastolic heart failure, atrial fibrillation, COPD, interstitial lung disease on home O2.  Patient presented secondary to increased weakness and upper/lower extremity jerking.  Patient was found to have severe hypercapnia requiring BiPAP.  She had some mental status changes well which improved with BiPAP therapy.   Assessment & Plan:   Principal Problem:   Acute on chronic respiratory failure with hypoxia and hypercapnia (HCC) Active Problems:   Hypothyroidism   Essential hypertension   PULMONARY FIBROSIS ILD POST INFLAMMATORY CHRONIC   Morbid (severe) obesity due to excess calories (HCC)   S/P CABG x 3   Hyperlipidemia   Severe episode of recurrent major depressive disorder, without psychotic features (HCC)   Paroxysmal atrial fibrillation (HCC)   Chronic heart failure with preserved ejection fraction (HCC)   Leukocytosis   DNR (do not resuscitate)   Acute on chronic respiratory failure with hypoxia and hypercapnia Patient is on 2 to 3 L of oxygen as an outpatient.  Patient required BiPAP on admission for a's PCO2 of 89.9 and a pH of 7.266.  Patient has a history of CO2 retention from recent hospitalization back in February 2022.  Patient was planning to have a sleep study prior to admission.  Currently appears to be at baseline. Still having significant CO2 retention and will need BiPAP or NIV on discharge -Continue oxygen -BiPAP qhs  Acute metabolic encephalopathy Secondary to hypercarbia. Resolved.  AKI on CKD stage IIIb Baseline creatinine of about 1.3.  Creatinine on admission of 2.39 and is trended down with holding torsemide and IV hydration.  Leukocytosis Mild. Resolved. No evidence of infection.  Myoclonic jerking Possibly secondary to Lyrica,  although she is on a very low dose. Possibly related to hypercapnia. Resolved.  Chronic diastolic heart failure Patient is on torsemide 40 mg twice daily as an outpatient which was held secondary to AKI.  Normocytic anemia Acute on chronic anemia Stable.  Primary hypertension Patient is on losartan, diltiazem as an outpatient -Continue losartan and diltiazem  COPD Does not appear to have acute exacerbation.  Patient is on Pulmicort, formoterol, Incruse Ellipta, albuterol, Singulair as an outpatient.  Chronic pain Patient is on Lyrica and oxycodone as an outpatient. -Continue Lyrica and oxycodone  Paroxysmal atrial fibrillation/flutter Patient is on Tikosyn, diltiazem as an outpatient.  On Eliquis as an outpatient. -Continue Tikosyn and diltiazem -Continue Eliquis -Keep potassium greater than 4 and magnesium greater than 2  CAD History of NSTEMI s/p CABG. On Lipitor as an outpatient  Rheumatoid arthritis Patient is on golimumab as an outpatient  Depression Patient is on Cymbalta as an outpatient. -Continue Cymbalta  Hypothyroidism Patient is on Synthroid 125 mcg daily -Continue Synthroid 25 mcg daily  Morbid obesity Body mass index is 42.39 kg/m.   DVT prophylaxis: Eliquis Code Status:   Code Status: DNR Family Communication: Daughter at bedside. Son on telephone Disposition Plan: Discharge home when able to obtain NIV for discharge. If patient qualifies, will need NIV for safe discharge.   Consultants:  None  Procedures:  BiPAP  Antimicrobials: None   Subjective: No issues overnight. Some mild dyspnea  Objective: Vitals:   06/20/21 0726 06/20/21 0818 06/20/21 0822 06/20/21 1147  BP:  137/68 137/68 137/68  Pulse:  100  100  Resp:  (!) 22  Temp:    98.7 F (37.1 C)  TempSrc:    Axillary  SpO2: 96% 97%  96%  Weight:        Intake/Output Summary (Last 24 hours) at 06/20/2021 1510 Last data filed at 06/20/2021 0339 Gross per 24 hour  Intake  --  Output 500 ml  Net -500 ml    Filed Weights   06/19/21 0600 06/20/21 0318  Weight: 96 kg 95.2 kg    Examination:  General exam: Appears calm and comfortable Respiratory system: Diffused rales with mild wheezing. Slight tachypnea with pursed lip breathing. Cardiovascular system: S1 & S2 heard, RRR. No murmurs, rubs, gallops or clicks. Gastrointestinal system: Abdomen is nondistended, soft and nontender. No organomegaly or masses felt. Normal bowel sounds heard. Central nervous system: Alert and oriented. No focal neurological deficits. Musculoskeletal: No edema. No calf tenderness Skin: No cyanosis. No rashes Psychiatry: Judgement and insight appear normal. Mood & affect appropriate.     Data Reviewed: I have personally reviewed following labs and imaging studies  CBC Lab Results  Component Value Date   WBC 5.7 06/19/2021   RBC 3.23 (L) 06/19/2021   HGB 9.1 (L) 06/19/2021   HCT 31.0 (L) 06/19/2021   MCV 96.0 06/19/2021   MCH 28.2 06/19/2021   PLT 190 06/19/2021   MCHC 29.4 (L) 06/19/2021   RDW 14.2 06/19/2021   LYMPHSABS 1.5 06/17/2021   MONOABS 1.1 (H) 06/17/2021   EOSABS 0.4 06/17/2021   BASOSABS 0.1 06/17/2021     Last metabolic panel Lab Results  Component Value Date   NA 141 06/20/2021   K 4.3 06/20/2021   CL 94 (L) 06/20/2021   CO2 40 (H) 06/20/2021   BUN 47 (H) 06/20/2021   CREATININE 1.37 (H) 06/20/2021   GLUCOSE 108 (H) 06/20/2021   GFRNONAA 41 (L) 06/20/2021   GFRAA 56 (L) 06/08/2020   CALCIUM 9.7 06/20/2021   PHOS 4.1 12/30/2020   PROT 6.0 (L) 06/19/2021   ALBUMIN 3.0 (L) 06/19/2021   LABGLOB 2.6 01/18/2020   AGRATIO 1.6 01/18/2020   BILITOT 0.6 06/19/2021   ALKPHOS 68 06/19/2021   AST 20 06/19/2021   ALT 13 06/19/2021   ANIONGAP 7 06/20/2021    CBG (last 3)  Recent Labs    06/18/21 0839  GLUCAP 115*      GFR: Estimated Creatinine Clearance: 36.4 mL/min (A) (by C-G formula based on SCr of 1.37 mg/dL (H)).  Coagulation  Profile: No results for input(s): INR, PROTIME in the last 168 hours.  Recent Results (from the past 240 hour(s))  Resp Panel by RT-PCR (Flu A&B, Covid) Nasopharyngeal Swab     Status: None   Collection Time: 06/17/21 10:47 PM   Specimen: Nasopharyngeal Swab; Nasopharyngeal(NP) swabs in vial transport medium  Result Value Ref Range Status   SARS Coronavirus 2 by RT PCR NEGATIVE NEGATIVE Final    Comment: (NOTE) SARS-CoV-2 target nucleic acids are NOT DETECTED.  The SARS-CoV-2 RNA is generally detectable in upper respiratory specimens during the acute phase of infection. The lowest concentration of SARS-CoV-2 viral copies this assay can detect is 138 copies/mL. A negative result does not preclude SARS-Cov-2 infection and should not be used as the sole basis for treatment or other patient management decisions. A negative result may occur with  improper specimen collection/handling, submission of specimen other than nasopharyngeal swab, presence of viral mutation(s) within the areas targeted by this assay, and inadequate number of viral copies(<138 copies/mL). A negative result must be combined with clinical observations,  patient history, and epidemiological information. The expected result is Negative.  Fact Sheet for Patients:  BloggerCourse.com  Fact Sheet for Healthcare Providers:  SeriousBroker.it  This test is no t yet approved or cleared by the [place] FDA and  has been authorized for detection and/or diagnosis of SARS-CoV-2 by FDA under an Emergency Use Authorization (EUA). This EUA will remain  in effect (meaning this test can be used) for the duration of the COVID-19 declaration under Section 564(b)(1) of the Act, 21 U.S.C.section 360bbb-3(b)(1), unless the authorization is terminated  or revoked sooner.       Influenza A by PCR NEGATIVE NEGATIVE Final   Influenza B by PCR NEGATIVE NEGATIVE Final    Comment:  (NOTE) The Xpert Xpress SARS-CoV-2/FLU/RSV plus assay is intended as an aid in the diagnosis of influenza from Nasopharyngeal swab specimens and should not be used as a sole basis for treatment. Nasal washings and aspirates are unacceptable for Xpert Xpress SARS-CoV-2/FLU/RSV testing.  Fact Sheet for Patients: BloggerCourse.com  Fact Sheet for Healthcare Providers: SeriousBroker.it  This test is not yet approved or cleared by the [place] FDA and has been authorized for detection and/or diagnosis of SARS-CoV-2 by FDA under an Emergency Use Authorization (EUA). This EUA will remain in effect (meaning this test can be used) for the duration of the COVID-19 declaration under Section 564(b)(1) of the Act, 21 U.S.C. section 360bbb-3(b)(1), unless the authorization is terminated or revoked.  Performed at [facility], [address], [place], [place] [address]   MRSA Next Gen by PCR, Nasal     Status: Abnormal   Collection Time: 06/20/21 12:28 PM   Specimen: Nasal Mucosa; Nasal Swab  Result Value Ref Range Status   MRSA by PCR Next Gen DETECTED (A) NOT DETECTED Final    Comment: RESULT CALLED TO, READ BACK BY AND VERIFIED WITH: YOKO IMAI 06/20/2021 @0248  BY JW (NOTE) The GeneXpert MRSA Assay (FDA approved for NASAL specimens only), is one component of a comprehensive MRSA colonization surveillance program. It is not intended to diagnose MRSA infection nor to guide or monitor treatment for MRSA infections. Test performance is not FDA approved in patients less than 28 years old. Performed at [facility], [address], [place], [place]          Radiology Studies: DG CHEST PORT 1 VIEW  Result Date: 06/19/2021 CLINICAL DATA:  Hypoxia EXAM: PORTABLE CHEST 1 VIEW COMPARISON:  06/17/2021 FINDINGS: Cardiac shadow is enlarged but stable. Postsurgical changes are again seen. Vascular congestion and  interstitial edema is again identified slightly worsened when compared with the prior exam. No focal confluent infiltrate is seen. No bony abnormality is noted. IMPRESSION: Changes of CHF slightly worsened when compare with the prior exam. Electronically Signed   By: 06/19/2021 M.D.   On: 06/19/2021 19:40        Scheduled Meds:  albuterol  2.5 mg Nebulization BID   apixaban  5 mg Oral BID   arformoterol  15 mcg Nebulization BID   atorvastatin  40 mg Oral Daily   budesonide (PULMICORT) nebulizer solution  0.5 mg Nebulization BID   diltiazem  240 mg Oral Daily   dofetilide  125 mcg Oral BID   DULoxetine  30 mg Oral Daily   leflunomide  20 mg Oral Daily   levothyroxine  125 mcg Oral QAC breakfast   magnesium oxide  400 mg Oral Daily   montelukast  10 mg Oral Daily  pantoprazole  40 mg Oral QAC breakfast   pregabalin  50 mg Oral QHS   sodium chloride flush  3 mL Intravenous Q12H   vitamin B-12  1,000 mcg Oral Daily   Continuous Infusions:   LOS: 2 days     Jacquelin Hawking, MD [facility] 06/20/2021, 3:10 PM  If 7PM-7AM, please contact night-coverage [email or web address]

## 2021-06-20 NOTE — Progress Notes (Signed)
Date and time results received: 06/20/21 0545  Test: ABG pCO2 Critical Value: 70.4  Name of Provider Notified: Dr.Mansy  Orders Received? Or Actions Taken?: MD aware confirmed

## 2021-06-20 NOTE — Progress Notes (Signed)
PT Cancellation Note  Patient Details Name: [name] MRN: [id number] DOB: 08-Nov-1945   Cancelled Treatment:    Reason Eval/Treat Not Completed: Patient declined, no reason specified. Pt reports nausea, request PT return at a later time. PT will follow up as time allows.   Arlyss Gandy 06/20/2021, 5:42 PM

## 2021-06-20 NOTE — TOC Transition Note (Addendum)
Transition of Care [facility]) - CM/SW Discharge Note   Patient Details  Name: [name] MRN: [id number] Date of Birth: 01-27-1946  Transition [facility] Care [facility]) CM/SW Contact:  Leone Haven, RN Phone Number: 06/20/2021, 3:28 PM   Clinical Narrative:    NCM spoke with patient at bedside, offered choice, she states she is active with [facility] and would like to continue with them.  She also is ok with [facility] supplying the triology for her.  Zach with [employer] is following for the trilolgy. She has transportation at [employer].  Patient will be staying with her daughter at discharge at [address] [place] [address].    Final next level of care: Home w Home Health Services Barriers to Discharge: Continued Medical Work up   Patient Goals and CMS Choice Patient states their goals for this hospitalization and ongoing recovery are:: return home CMS Medicare.gov Compare Post Acute Care list provided to:: Patient Choice offered to / list presented to : Patient  Discharge Placement                       Discharge Plan and Services                DME Arranged: NIV DME Agency: [facility] Date DME Agency Contacted: 06/20/21 Time DME Agency Contacted: 1527 Representative spoke with at DME Agency: Ian Malkin HH Arranged: PT HH Agency: [facility] Date [facility] Agency Contacted: 06/20/21 Time HH Agency Contacted: 1528 Representative spoke with at [facility] Agency: Stacie  Social Determinants of Health (SDOH) Interventions     Readmission Risk Interventions No flowsheet data found.

## 2021-06-20 NOTE — Progress Notes (Signed)
[name] presents with acute on chronic respiratory failure with hypoxia and hypercapnia with COPD.   The use of the NIV will treat patients high PC02 levels (70.4 on 06/20/21 with elevated bicarbonate of 36.2).  NIV can reduce risk of exacerbations and future hospitalizations (this is her fourth admission year to date) when used at night and during the day. All alternate devices [phone] and [id number]) have been proven ineffective to provide essential volume control necessary to maintain acceptable CO2 levels. An NIV with AVAPS AE is necessary to prevent patient harm.  Interruption or failure to provide NIV would quickly lead to exacerbation of the patients condition, hospital admission, and likely harm to the patient.  Continued use is preferred.  Patient is able to protect their airways and clear secretions on their own.

## 2021-06-20 NOTE — Progress Notes (Signed)
RT note. Patient placed on auto cpap 20/5 with 3L bled in line. Patient sat 93% and resting comfortable, RT will continue to monitor.

## 2021-06-21 MED ORDER — TORSEMIDE 20 MG PO TABS: 20.0000 mg | ORAL_TABLET | Freq: Two times a day (BID) | ORAL | Status: DC

## 2021-06-21 NOTE — TOC Transition Note (Signed)
Transition of Care (TOC) - CM/SW Discharge Note Donn Pierini RN, BSN Transitions of Care Unit 4E- RN Case Manager See Treatment Team for direct phone #  Cross coverage for 6E  Patient Details  Name: [name] MRN: [id number] Date of Birth: 21-Apr-1946  Transition of Care [facility]) CM/SW Contact:  Darrold Span, RN Phone Number: 06/21/2021, 12:32 PM   Clinical Narrative:    Notified this am by Ian Malkin with [employer] that pt has been approved for home [facility] NIV. They will be able to deliver NIV to the home and provide education with an RT in the home today this afternoon. They will contact pt/family to set up time for delivery and education.   Pt is stable for transition home today, HH has been resumed with [facility] as per pt request. Orders in- and notification sent to [facility] with [facility] for resumption of HH services.   Family to transport home.    Final next level of care: Home w Home Health Services Barriers to Discharge: Barriers Resolved   Patient Goals and CMS Choice Patient states their goals for this hospitalization and ongoing recovery are:: return home CMS Medicare.gov Compare Post Acute Care list provided to:: Patient Choice offered to / list presented to : Patient  Discharge Placement                 Home with [facility]      Discharge Plan and Services   Discharge Planning Services: CM Consult Post Acute Care Choice: Durable Medical Equipment, Home Health          DME Arranged: NIV DME Agency: [facility] Date DME Agency Contacted: 06/20/21 Time DME Agency Contacted: 1527 Representative spoke with at DME Agency: Ian Malkin HH Arranged: PT HH Agency: [facility] Date [facility] Agency Contacted: 06/20/21 Time HH Agency Contacted: 1528 Representative spoke with at [facility] Agency: Stacie  Social Determinants of Health (SDOH) Interventions     Readmission Risk Interventions Readmission Risk Prevention Plan 06/21/2021  Transportation Screening Complete   PCP or Specialist Appt within 5-7 Days Complete  Home Care Screening Complete  Medication Review (RN CM) Complete  Some recent data might be hidden

## 2021-06-21 NOTE — Progress Notes (Signed)
PT Cancellation Note  Patient Details Name: [name] MRN: [id number] DOB: 23-Feb-1946   Cancelled Treatment:    Reason Eval/Treat Not Completed: Patient declined, no reason specified. Pt prepared for discharge, declines PT treatment at this time.   Arlyss Gandy 06/21/2021, 11:21 AM

## 2021-06-21 NOTE — Progress Notes (Signed)
Nutrition Brief Note  Patient identified on the Malnutrition Screening Tool (MST) Report  Wt Readings from Last 15 Encounters:  06/21/21 93.5 kg  06/06/21 92.5 kg  06/05/21 90.8 kg  06/03/21 92.5 kg  05/24/21 106.2 kg  04/22/21 90.7 kg  04/16/21 91.6 kg  03/28/21 91.6 kg  03/18/21 90.7 kg  02/14/21 90.6 kg  02/12/21 89.4 kg  02/05/21 93.9 kg  01/11/21 88.5 kg  01/07/21 88.5 kg  01/03/21 89 kg   [name] is a 75 y.o. female with a history of CAD status post CABG, diastolic heart failure, atrial fibrillation, COPD, interstitial lung disease on home O2.  Patient presented secondary to increased weakness and upper/lower extremity jerking.  Patient was found to have severe hypercapnia requiring BiPAP.  She had some mental status changes well which improved with BiPAP therapy.  Pt admitted with acute respiratory failure.   Per MD notes, plan for discharge today.   Body mass index is 41.63 kg/m. Patient meets criteria for extreme obesity, class III based on current BMI. Obesity is a complex, chronic medical condition that is optimally managed by a multidisciplinary care team. Weight loss is not an ideal goal for an acute inpatient hospitalization. However, if further work-up for obesity is warranted, consider outpatient referral to outpatient bariatric service and/or [facility].    Current diet order is Heart Healthy, patient is consuming approximately n/a% of meals at this time. Labs and medications reviewed.   No nutrition interventions warranted at this time. If nutrition issues arise, please consult RD.   [name], RD, LDN, CDCES Registered Dietitian II Certified Diabetes Care and Education Specialist Please refer to [facility] for RD and/or RD on-call/weekend/after hours pager

## 2021-06-21 NOTE — Discharge Instructions (Signed)
[name],  You are in the hospital secondary to kidney injury and confusion.  The kidney injury may have been related to your recent increase in torsemide for which I recommend you go back to a 20 mg dose.  When your kidneys are not working as well, it can cause medication such as Lyrica to build up which can definitely lead to your jerking which is now thankfully resolved.  Your confusion was likely secondary to increased carbon dioxide in your bloodstream related to your overall lung disease.  You are qualified for noninvasive ventilator for which she should use every night to keep your carbon dioxide levels low.  I recommend that you still follow-up with your lung doctor, Dr. Sherene Sires for consideration of sleep study.

## 2021-06-21 NOTE — Discharge Summary (Signed)
Physician Discharge Summary  [name] ION:[id number] DOB: Dec 19, 1945 DOA: 06/17/2021  PCP: Amber Aspen, [name], MD  Admit date: 06/17/2021 Discharge date: 06/21/2021  Admitted From: Home Disposition: Home  Recommendations for Outpatient Follow-up:  Follow up with PCP in 1 week Please follow up on the following pending results: None  Home Health: PT/OT Equipment/Devices: Trilogy Ventilator  Discharge Condition: Stable CODE STATUS: DNR Diet recommendation: Heart healthy   Brief/Interim Summary:  Admission HPI written by Clydie Braun, MD   HPI: [name] is a 75 y.o. female with medical history significant of CAD s/p CABG, HFpEF, atrial fibrillation on COPD, ILD on home O2 who presents with complaints of increased weakness with upper and lower extremity jerks.  She had went to have a sleep study and was unable to get in the bed to have the study, or back into the car after the study has been postponed.  Patient's daughter helps provide additional history as she is currently on BiPAP.  Apparently, the patient had been having intermittent jerking of her upper and lower extremities for about 1 month.  Jerking episodes were usually brought on with movement and worsening over the last 1-2 weeks.  Symptoms suspect to have started after several medication changes have been made for which patient notes Eliquis, Tikosyn, hydrocodone, and depression medication changes because the previous did not agree with Tikosyn.  Patient had been seen by her cardiologist on 8/11 and at that time her torsemide had been increased from 20 mg daily to 40 mg twice daily. Daughter notes associated symptoms shortness of breath with exertion that seems to have worsened, increased lethargy, and increased cough.  Patient denied any complaints of fever.    Since issues with her right hip she has been not able to ambulate on her own.   Her sleep study had been performed and attempts to get the  patient to surgery for her right hip so that she would be able to be ambulatory again.  Patient confirms that she would like to remain a DNR, but is okay with BiPAP and is amendable to intubation if thought to be likely temporary.   Hospital course:  Acute on chronic respiratory failure with hypoxia and hypercapnia Patient is on 2 to 3 L of oxygen as an outpatient.  Patient required BiPAP on admission for a's PCO2 of 89.9 and a pH of 7.266.  Patient has a history of CO2 retention from recent hospitalization back in February 2022.  Patient was planning to have a sleep study prior to admission.  Currently appears to be at baseline. Patient qualified for non-invasive ventilator to treat chronic hypercapnia. Continue oxygen on discharge.   Acute metabolic encephalopathy Secondary to hypercarbia. Resolved.   AKI on CKD stage IIIb Baseline creatinine of about 1.3.  Creatinine on admission of 2.39 and is trended down with holding torsemide and IV hydration.   Leukocytosis Mild. Resolved. No evidence of infection.   Myoclonic jerking Possibly secondary to Lyrica, although she is on a very low dose. Possibly related to hypercapnia. Resolved.   Chronic diastolic heart failure Patient is on torsemide 40 mg twice daily as an outpatient which was held secondary to AKI. Resume previous dose of torsemide 20 mg BID.   Normocytic anemia Acute on chronic anemia Stable.   Primary hypertension Patient is on losartan, diltiazem as an outpatient. Continue on discharge   COPD Does not appear to have acute exacerbation.  Patient is on Pulmicort, formoterol, Incruse Ellipta,  albuterol, Singulair as an outpatient.   Chronic pain Patient is on Lyrica and oxycodone as an outpatient. Continue on discharge.   Paroxysmal atrial fibrillation/flutter Patient is on Tikosyn, diltiazem as an outpatient.  On Eliquis as an outpatient. Continue on discharge.   CAD History of NSTEMI s/p CABG. On Lipitor as an  outpatient. Continue on discharge.   Rheumatoid arthritis Patient is on golimumab as an outpatient   Depression Patient is on Cymbalta as an outpatient. Continue on discharge   Hypothyroidism Patient is on Synthroid 125 mcg daily. Continue on discharge   Morbid obesity Body mass index is 41.63 kg/m.   Discharge Diagnoses:  Principal Problem:   Acute on chronic respiratory failure with hypoxia and hypercapnia (HCC) Active Problems:   Hypothyroidism   Essential hypertension   PULMONARY FIBROSIS ILD POST INFLAMMATORY CHRONIC   Morbid (severe) obesity due to excess calories (HCC)   S/P CABG x 3   Hyperlipidemia   Severe episode of recurrent major depressive disorder, without psychotic features (HCC)   Paroxysmal atrial fibrillation (HCC)   Chronic heart failure with preserved ejection fraction (HCC)   Leukocytosis   DNR (do not resuscitate)    Discharge Instructions   Allergies as of 06/21/2021       Reactions   Gabapentin Other (See Comments)   Dizziness, lighthead        Medication List     STOP taking these medications    amoxicillin 500 MG tablet Commonly known as: AMOXIL       TAKE these medications    acetaminophen 650 MG CR tablet Commonly known as: TYLENOL Take 650 mg by mouth in the morning and at bedtime. And as needed for mild pain/headache   albuterol 108 (90 Base) MCG/ACT inhaler Commonly known as: VENTOLIN HFA Inhale 2 puffs into the lungs every 4 (four) hours as needed (shortness of breath, if you can't catch your breath). What changed: reasons to take this   apixaban 5 MG Tabs tablet Commonly known as: Eliquis Take 1 tablet (5 mg total) by mouth 2 (two) times daily.   atorvastatin 40 MG tablet Commonly known as: LIPITOR TAKE 1 TABLET BY MOUTH  DAILY AT 6 PM What changed:  how much to take how to take this when to take this additional instructions   budesonide 0.25 MG/2ML nebulizer solution Commonly known as: Pulmicort One  twice daily   CALCIUM PO Take 1 tablet by mouth daily.   clotrimazole-betamethasone cream Commonly known as: LOTRISONE Apply 1 application topically 2 (two) times daily as needed. What changed: reasons to take this   diltiazem 240 MG 24 hr capsule Commonly known as: CARDIZEM CD Take 1 capsule (240 mg total) by mouth daily.   dofetilide 250 MCG capsule Commonly known as: TIKOSYN Take 1 capsule (250 mcg total) by mouth 2 (two) times daily.   DULoxetine 30 MG capsule Commonly known as: Cymbalta Take 1 capsule (30 mg total) by mouth daily.   ferrous sulfate 325 (65 FE) MG tablet Take 1 tablet (325 mg total) by mouth daily with breakfast.   formoterol 20 MCG/2ML nebulizer solution Commonly known as: PERFOROMIST Take 2 mLs (20 mcg total) by nebulization 2 (two) times daily. Use in nebulizer twice daily perfectly regularly   golimumab 50 MG/4ML Soln injection Commonly known as: SIMPONI ARIA Inject 50 mg into the vein every 8 (eight) weeks.   Incruse Ellipta 62.5 MCG/INH Aepb Generic drug: umeclidinium bromide Inhale 1 puff into the lungs daily.   ketoconazole 2 %  cream Commonly known as: NIZORAL Apply 1 application topically daily as needed for irritation.   leflunomide 20 MG tablet Commonly known as: Arava Take 1 tablet (20 mg total) by mouth daily.   levothyroxine 125 MCG tablet Commonly known as: SYNTHROID TAKE 1 TABLET BY MOUTH EVERY DAY   losartan 50 MG tablet Commonly known as: COZAAR Take 1 tablet (50 mg total) by mouth daily.   Magnesium Oxide 400 MG Caps Take 1 capsule (400 mg total) by mouth daily.   montelukast 10 MG tablet Commonly known as: SINGULAIR Take 10 mg by mouth daily.   nitroGLYCERIN 0.4 MG SL tablet Commonly known as: NITROSTAT Place 1 tablet (0.4 mg total) under the tongue every 5 (five) minutes as needed for chest pain.   nystatin powder Commonly known as: MYCOSTATIN/NYSTOP APPLY 1 APPLICATION        TOPICALLY 3 TIMES A DAY What  changed: See the new instructions.   Oxycodone HCl 10 MG Tabs Take 10 mg by mouth 3 (three) times daily as needed for pain.   OXYGEN Inhale 2 L into the lungs continuous. continuous o2   pantoprazole 40 MG tablet Commonly known as: PROTONIX Take 1 tablet (40 mg total) by mouth daily before breakfast.   pregabalin 50 MG capsule Commonly known as: LYRICA Take 50 mg by mouth at bedtime.   torsemide 20 MG tablet Commonly known as: DEMADEX Take 1 tablet (20 mg total) by mouth 2 (two) times daily. What changed: how much to take   triamcinolone cream 0.1 % Commonly known as: KENALOG Apply 1 application topically 2 (two) times daily as needed (for psoriasis).   vitamin B-12 1000 MCG tablet Commonly known as: CYANOCOBALAMIN Take 1,000 mcg by mouth daily.   Vitamin D3 50 MCG (2000 UT) Tabs Take 2,000 Int'l Units by mouth daily.        Follow-up Information     [facility] Follow up.   Specialty: Home Health Services Why: HHPT arranged- they will contact you to schedule home visits Contact information: [address] [place] [address] [phone]         [facility] Follow up.   Why: triology arranged and has been approved - they will contact you regarding delivery to the home Contact information: [address] [place] [address] [phone]                Allergies  Allergen Reactions   Gabapentin Other (See Comments)    Dizziness, lighthead    Consultations: None   Procedures/Studies: DG Chest 1 View  Result Date: 06/17/2021 CLINICAL DATA:  Chronic tremors EXAM: CHEST  1 VIEW COMPARISON:  06/05/2021 FINDINGS: Cardiac shadow is within normal limits. Postsurgical changes are noted. Mild interstitial changes are again noted consistent with vascular congestion and edema. No sizable effusion is noted. No new focal abnormality is seen. IMPRESSION: Stable vascular congestion and mild interstitial edema.  Electronically Signed   By: Alcide Clever M.D.   On: 06/17/2021 22:54   DG Chest 2 View  Result Date: 06/06/2021 CLINICAL DATA:  Shortness of breath.  Edema. EXAM: CHEST - 2 VIEW COMPARISON:  Chest x-ray 04/22/2021. FINDINGS: Prior CABG. Cardiomegaly with pulmonary venous congestion and diffuse bilateral interstitial prominence. Small left pleural effusion. Findings consistent with CHF. No pneumothorax. Carotid vascular calcification. Surgical clips upper abdomen. IMPRESSION: 1. Prior CABG. Cardiomegaly with pulmonary venous congestion, bilateral interstitial prominence and small left pleural effusion. Findings consistent with CHF. 2.  Carotid  vascular disease. Electronically Signed   By: Maisie Fus  Register   On: 06/06/2021 06:03   DG CHEST PORT 1 VIEW  Result Date: 06/19/2021 CLINICAL DATA:  Hypoxia EXAM: PORTABLE CHEST 1 VIEW COMPARISON:  06/17/2021 FINDINGS: Cardiac shadow is enlarged but stable. Postsurgical changes are again seen. Vascular congestion and interstitial edema is again identified slightly worsened when compared with the prior exam. No focal confluent infiltrate is seen. No bony abnormality is noted. IMPRESSION: Changes of CHF slightly worsened when compare with the prior exam. Electronically Signed   By: Alcide Clever M.D.   On: 06/19/2021 19:40     Subjective: No issues overnight. No confusion.  Discharge Exam: Vitals:   06/21/21 0804 06/21/21 0938  BP:  (!) 142/95  Pulse:  (!) 104  Resp:    Temp:  98.5 F (36.9 C)  SpO2: 100% 94%   Vitals:   06/20/21 2102 06/21/21 0500 06/21/21 0804 06/21/21 0938  BP:  (!) 142/75  (!) 142/95  Pulse:  98  (!) 104  Resp:  18    Temp:  98 F (36.7 C)  98.5 F (36.9 C)  TempSrc:  Oral  Oral  SpO2: 93% 93% 100% 94%  Weight:  93.5 kg    Height:        General exam: Appears calm and comfortable Respiratory system: Diminished with rales. Respiratory effort normal. Cardiovascular system: S1 & S2 heard, RRR. No murmurs, rubs, gallops or  clicks. Gastrointestinal system: Abdomen is nondistended, soft and nontender. No organomegaly or masses felt. Normal bowel sounds heard. Central nervous system: Alert and oriented. No focal neurological deficits. Musculoskeletal: No edema. No calf tenderness Skin: No cyanosis. No rashes Psychiatry: Judgement and insight appear normal. Mood & affect appropriate.     The results of significant diagnostics from this hospitalization (including imaging, microbiology, ancillary and laboratory) are listed below for reference.     Microbiology: Recent Results (from the past 240 hour(s))  Resp Panel by RT-PCR (Flu A&B, Covid) Nasopharyngeal Swab     Status: None   Collection Time: 06/17/21 10:47 PM   Specimen: Nasopharyngeal Swab; Nasopharyngeal(NP) swabs in vial transport medium  Result Value Ref Range Status   SARS Coronavirus 2 by RT PCR NEGATIVE NEGATIVE Final    Comment: (NOTE) SARS-CoV-2 target nucleic acids are NOT DETECTED.  The SARS-CoV-2 RNA is generally detectable in upper respiratory specimens during the acute phase of infection. The lowest concentration of SARS-CoV-2 viral copies this assay can detect is 138 copies/mL. A negative result does not preclude SARS-Cov-2 infection and should not be used as the sole basis for treatment or other patient management decisions. A negative result may occur with  improper specimen collection/handling, submission of specimen other than nasopharyngeal swab, presence of viral mutation(s) within the areas targeted by this assay, and inadequate number of viral copies(<138 copies/mL). A negative result must be combined with clinical observations, patient history, and epidemiological information. The expected result is Negative.  Fact Sheet for Patients:  BloggerCourse.com  Fact Sheet for Healthcare Providers:  SeriousBroker.it  This test is no t yet approved or cleared by the [place]  FDA and  has been authorized for detection and/or diagnosis of SARS-CoV-2 by FDA under an Emergency Use Authorization (EUA). This EUA will remain  in effect (meaning this test can be used) for the duration of the COVID-19 declaration under Section 564(b)(1) of the Act, 21 U.S.C.section 360bbb-3(b)(1), unless the authorization is terminated  or revoked sooner.       Influenza  A by PCR NEGATIVE NEGATIVE Final   Influenza B by PCR NEGATIVE NEGATIVE Final    Comment: (NOTE) The Xpert Xpress SARS-CoV-2/FLU/RSV plus assay is intended as an aid in the diagnosis of influenza from Nasopharyngeal swab specimens and should not be used as a sole basis for treatment. Nasal washings and aspirates are unacceptable for Xpert Xpress SARS-CoV-2/FLU/RSV testing.  Fact Sheet for Patients: BloggerCourse.com  Fact Sheet for Healthcare Providers: SeriousBroker.it  This test is not yet approved or cleared by the [place] FDA and has been authorized for detection and/or diagnosis of SARS-CoV-2 by FDA under an Emergency Use Authorization (EUA). This EUA will remain in effect (meaning this test can be used) for the duration of the COVID-19 declaration under Section 564(b)(1) of the Act, 21 U.S.C. section 360bbb-3(b)(1), unless the authorization is terminated or revoked.  Performed at [facility], [address] [place], [place] [address]   MRSA Next Gen by PCR, Nasal     Status: Abnormal   Collection Time: 06/20/21 12:28 PM   Specimen: Nasal Mucosa; Nasal Swab  Result Value Ref Range Status   MRSA by PCR Next Gen DETECTED (A) NOT DETECTED Final    Comment: RESULT CALLED TO, READ BACK BY AND VERIFIED WITH: YOKO IMAI 06/20/2021 @0248  BY JW (NOTE) The GeneXpert MRSA Assay (FDA approved for NASAL specimens only), is one component of a comprehensive MRSA colonization surveillance program. It is not intended to diagnose MRSA infection nor to  guide or monitor treatment for MRSA infections. Test performance is not FDA approved in patients less than 22 years old. Performed at [facility], [address], [place], [place]      Labs: BNP (last 3 results) Recent Labs    12/27/20 0136 06/18/21 0912  BNP 329.1* 100.8*   Basic Metabolic Panel: Recent Labs  Lab 06/17/21 2235 06/18/21 1256 06/18/21 1539 06/19/21 0225 06/19/21 1106 06/20/21 0310  NA 137 138 140 142  --  141  K 4.4 4.2 4.5 5.2* 4.1 4.3  CL 86*  --  92* 96*  --  94*  CO2 39*  --  40* 40*  --  40*  GLUCOSE 135*  --  132* 163*  --  108*  BUN 51*  --  49* 45*  --  47*  CREATININE 2.39*  --  1.93* 1.51*  --  1.37*  CALCIUM 9.3  --  9.1 9.4  --  9.7  MG  --   --  2.1 2.2  --   --    Liver Function Tests: Recent Labs  Lab 06/19/21 0225  AST 20  ALT 13  ALKPHOS 68  BILITOT 0.6  PROT 6.0*  ALBUMIN 3.0*   No results for input(s): LIPASE, AMYLASE in the last 168 hours. Recent Labs  Lab 06/19/21 0225  AMMONIA 32   CBC: Recent Labs  Lab 06/17/21 2235 06/18/21 1236 06/18/21 1256 06/19/21 0225  WBC 10.8*  --   --  5.7  NEUTROABS 7.8*  --   --   --   HGB 9.7* 8.8* 9.5* 9.1*  HCT 33.7* 30.1* 28.0* 31.0*  MCV 96.6  --   --  96.0  PLT 226  --   --  190   Cardiac Enzymes: No results for input(s): CKTOTAL, CKMB, CKMBINDEX, TROPONINI in the last 168 hours. BNP: Invalid input(s): POCBNP CBG: Recent Labs  Lab 06/18/21 0839  GLUCAP 115*   D-Dimer No results for input(s): DDIMER in the last 72  hours. Hgb A1c No results for input(s): HGBA1C in the last 72 hours. Lipid Profile No results for input(s): CHOL, HDL, LDLCALC, TRIG, CHOLHDL, LDLDIRECT in the last 72 hours. Thyroid function studies No results for input(s): TSH, T4TOTAL, T3FREE, THYROIDAB in the last 72 hours.  Invalid input(s): FREET3 Anemia work up No results for input(s): VITAMINB12, FOLATE, FERRITIN, TIBC, IRON, RETICCTPCT in the last 72 hours. Urinalysis     Component Value Date/Time   COLORURINE YELLOW 06/18/2021 0457   APPEARANCEUR HAZY (A) 06/18/2021 0457   LABSPEC 1.014 06/18/2021 0457   PHURINE 5.0 06/18/2021 0457   GLUCOSEU NEGATIVE 06/18/2021 0457   GLUCOSEU NEGATIVE 05/29/2021 1557   HGBUR NEGATIVE 06/18/2021 0457   HGBUR negative 07/18/2010 0941   BILIRUBINUR NEGATIVE 06/18/2021 0457   BILIRUBINUR n 08/10/2012 1131   KETONESUR NEGATIVE 06/18/2021 0457   PROTEINUR NEGATIVE 06/18/2021 0457   UROBILINOGEN 0.2 05/29/2021 1557   NITRITE NEGATIVE 06/18/2021 0457   LEUKOCYTESUR NEGATIVE 06/18/2021 0457   Sepsis Labs Invalid input(s): PROCALCITONIN,  WBC,  LACTICIDVEN Microbiology Recent Results (from the past 240 hour(s))  Resp Panel by RT-PCR (Flu A&B, Covid) Nasopharyngeal Swab     Status: None   Collection Time: 06/17/21 10:47 PM   Specimen: Nasopharyngeal Swab; Nasopharyngeal(NP) swabs in vial transport medium  Result Value Ref Range Status   SARS Coronavirus 2 by RT PCR NEGATIVE NEGATIVE Final    Comment: (NOTE) SARS-CoV-2 target nucleic acids are NOT DETECTED.  The SARS-CoV-2 RNA is generally detectable in upper respiratory specimens during the acute phase of infection. The lowest concentration of SARS-CoV-2 viral copies this assay can detect is 138 copies/mL. A negative result does not preclude SARS-Cov-2 infection and should not be used as the sole basis for treatment or other patient management decisions. A negative result may occur with  improper specimen collection/handling, submission of specimen other than nasopharyngeal swab, presence of viral mutation(s) within the areas targeted by this assay, and inadequate number of viral copies(<138 copies/mL). A negative result must be combined with clinical observations, patient history, and epidemiological information. The expected result is Negative.  Fact Sheet for Patients:  BloggerCourse.com  Fact Sheet for Healthcare Providers:   SeriousBroker.it  This test is no t yet approved or cleared by the [place] FDA and  has been authorized for detection and/or diagnosis of SARS-CoV-2 by FDA under an Emergency Use Authorization (EUA). This EUA will remain  in effect (meaning this test can be used) for the duration of the COVID-19 declaration under Section 564(b)(1) of the Act, 21 U.S.C.section 360bbb-3(b)(1), unless the authorization is terminated  or revoked sooner.       Influenza A by PCR NEGATIVE NEGATIVE Final   Influenza B by PCR NEGATIVE NEGATIVE Final    Comment: (NOTE) The Xpert Xpress SARS-CoV-2/FLU/RSV plus assay is intended as an aid in the diagnosis of influenza from Nasopharyngeal swab specimens and should not be used as a sole basis for treatment. Nasal washings and aspirates are unacceptable for Xpert Xpress SARS-CoV-2/FLU/RSV testing.  Fact Sheet for Patients: BloggerCourse.com  Fact Sheet for Healthcare Providers: SeriousBroker.it  This test is not yet approved or cleared by the [place] FDA and has been authorized for detection and/or diagnosis of SARS-CoV-2 by FDA under an Emergency Use Authorization (EUA). This EUA will remain in effect (meaning this test can be used) for the duration of the COVID-19 declaration under Section 564(b)(1) of the Act, 21 U.S.C. section 360bbb-3(b)(1), unless the authorization is terminated or revoked.  Performed at [facility], [address], [place], [place] [address]   MRSA Next Gen by PCR, Nasal     Status: Abnormal   Collection Time: 06/20/21 12:28 PM   Specimen: Nasal Mucosa; Nasal Swab  Result Value Ref Range Status   MRSA by PCR Next Gen DETECTED (A) NOT DETECTED Final    Comment: RESULT CALLED TO, READ BACK BY AND VERIFIED WITH: YOKO IMAI 06/20/2021  BY JW (NOTE) The GeneXpert MRSA Assay (FDA approved for NASAL specimens only), is one component of a  comprehensive MRSA colonization surveillance program. It is not intended to diagnose MRSA infection nor to guide or monitor treatment for MRSA infections. Test performance is not FDA approved in patients less than 52 years old. Performed at [facility], [address] [place], [place] [address]      Time coordinating discharge: 35 minutes  SIGNED:   Jacquelin Hawking, MD [facility] 06/21/2021, 12:22 PM

## 2021-06-21 NOTE — Care Management Important Message (Signed)
Important Message  Patient Details  Name: Zeniyah Peaster MRN: [id number] Date of Birth: 1945-11-25   Medicare Important Message Given:  Yes     Renie Ora 06/21/2021, 8:33 AM

## 2021-06-21 NOTE — Consult Note (Signed)
   [facility] Inpatient Consult   06/21/2021  [name] 06/25/1946 [id number]  [facility] [[facility]]  Accountable Care Organization [ACO] Patient: Medicare CMS [name]  Primary Care Providr:  [name], [name], MD, an Embedded provider at [facility] which has a Chronic Care Management program and team  Patient screened for hospitalization with noted having and Embedded Chronic Care Management provider.  Review of patient's medical record reveals patient is for home. Came by to see patient and patient had already transitioned home. Review for no current follow up needs noted.   Plan:  Patient is to have TOC follow up with primary care provider's office which can offer the Embedded chronic care management program.    For questions contact:   Charlesetta Shanks, RN BSN CCM [facility]  [phone] business mobile phone Toll free office [phone]  Fax number: [phone] [name]

## 2021-06-24 ENCOUNTER — Ambulatory Visit (INDEPENDENT_AMBULATORY_CARE_PROVIDER_SITE_OTHER): Payer: Medicare Other | Admitting: Internal Medicine

## 2021-06-24 ENCOUNTER — Encounter: Payer: Self-pay | Admitting: Internal Medicine

## 2021-06-24 ENCOUNTER — Telehealth: Payer: Self-pay

## 2021-06-24 ENCOUNTER — Other Ambulatory Visit: Payer: Self-pay

## 2021-06-24 ENCOUNTER — Ambulatory Visit: Payer: Medicare Other | Admitting: Internal Medicine

## 2021-06-24 DIAGNOSIS — J449 Chronic obstructive pulmonary disease, unspecified: Secondary | ICD-10-CM | POA: Diagnosis not present

## 2021-06-24 DIAGNOSIS — J9611 Chronic respiratory failure with hypoxia: Secondary | ICD-10-CM

## 2021-06-24 DIAGNOSIS — J9612 Chronic respiratory failure with hypercapnia: Secondary | ICD-10-CM | POA: Diagnosis not present

## 2021-06-24 NOTE — Telephone Encounter (Signed)
Transition Care Management Follow-up Telephone Call Date of discharge and from where: 06/21/2021  [facility]  How have you been since you were released from the hospital? well Any questions or concerns? No  Items Reviewed: Did the pt receive and understand the discharge instructions provided? Yes  Medications obtained and verified? Yes  Other? No  Any new allergies since your discharge? No  Dietary orders reviewed? Yes Do you have support at home? Yes   Home Care and Equipment/Supplies: Were home health services ordered? not applicable If so, what is the [facility] of the agency? [facility]  Has the agency set up a time to come to the patient's home? no Were any new equipment or medical supplies ordered?  No What is the name of the medical supply agency? N/a Were you able to get the supplies/equipment? not applicable Do you have any questions related to the use of the equipment or supplies? No  Functional Questionnaire: (I = Independent and D = Dependent) ADLs: D  Bathing/Dressing- D  Meal Prep-D  Eating- I  Maintaining continence- D  Transferring/Ambulation- D  Managing Meds- D  Follow up appointments reviewed:  PCP Hospital f/u appt confirmed? Yes  Scheduled to see Dr.Hernadez  on 06/27/2021@ 200pm Specialist Hospital f/u appt confirmed? Yes  Scheduled to see Dr Sherene Sires  on 06/24/2021@ 100am  Are transportation arrangements needed? No  If their condition worsens, is the pt aware to call PCP or go to the Emergency Dept.? Yes Was the patient provided with contact information for the PCP's office or ED? Yes Was to pt encouraged to call back with questions or concerns? Yes

## 2021-06-24 NOTE — Assessment & Plan Note (Signed)
Quit smoking May 2006       - PFT's  04/12/10 FEV1  1.21 (69%) ratio 77 and no change p B2,  DLC0 56%   VC 70%         - PFTs  08/08/2013 FEV1 1.21 (60%) ratio 86 and no change p B2 DLCO 79%  VC 72%  On symbicort 160 2bid  - PFT's  02/08/2018  FEV1 0.70 (40 % ) ratio 56 if use FEV1/VC  p 38 % improvement from saba p symb 160 prior to study with DLCO  78 % corrects to 147  % for alv volume   - 02/08/2018  After extensive coaching inhaler device  effectiveness =    75% with mdi and 90% with dpi so change to Trelegy > no change from sample so rec resume symb 160 2 bid as of 03/24/2018  - 04/18/2019    changed spiriva to smi  11/08/2019  After extensive coaching inhaler device,  effectiveness =    75% (short Ti) > continue symb / spiriva  - 11/08/2019 added Plan D = Deltasone x 6 days for flares  - 07/02/2020 change symbicort to  Budesonide and formoterol and continue spriva  - 04/08/2021 televisit :  spiriva changed to incruse by insurance company     Group D in terms of symptom/risk and laba/lama/ICS  therefore appropriate rx at this point >>>  She has no active AB component while on triple rx with bud/perforomist/incruse and is as good as she's likely to get in term of being a candidate for L THR unless flare in between now and surgery so cleared for surgery with contingencies for resp failure below

## 2021-06-24 NOTE — Assessment & Plan Note (Signed)
02 dependent  since 07/02/10 >>  83% RA December 05, 2010       - ONO RA 08/05/12  :  Positive sat < 89 x 2:52m> repeat on 2lpm rec 08/12/2012  - 06/17/2013 reported desat with activity p Knee surgery > rec restart 2lpm with activity  - 06/27/2013   Walked 2lpm  x one lap @ 185 stopped due to sat 88% not sob , desat to 82% on RA just at the  Start of the walk  - 04/10/14  Sats  81% RA and then  Walked 3lpm x  2 laps @ 185 ft each stopped due to end of study, no desat  - HCO3  11/09/17     = 31 - HCO3  08/27/18 = 37   - HCO3  03/11/2019 = 33   - HC03   02/05/21     = 35  - Placed on NIV 06/17/21 >>> f/u Dr Wynona Neat planned  Likely scenario post op will resume NIV dep in bed but be able to mobilize daytime to NP with titration daytime to keep sats > 90%  Worse case scenario, which I have asked her to discuss with daughter Cala Bradford preop is  Trach/ vent dep post op which she says she would not be willing to commit to and would likely to agrees to a plliative approach as full NCB status if this developed.  With this in consideration I cleared her for THR as she can't tol the pain any longer.         Each maintenance medication was reviewed in detail including emphasizing most importantly the difference between maintenance and prns and under what circumstances the prns are to be triggered using an action plan format where appropriate.  Total time for H and P, chart review, counseling, reviewing neb/dpi and 02  device(s) and generating customized AVS unique to this office visit / same day charting =30 min

## 2021-06-24 NOTE — Patient Instructions (Signed)
You are cleared for surgery on L hip   I will be referring you [facility] for evaluation for Non-invasive Ventilation at night  Follow up with me is as needed

## 2021-06-24 NOTE — Progress Notes (Signed)
Subjective:   Patient ID: [name], female    DOB: 03-30-1946    MRN: [id number]   Brief patient profile:  75  yowf  quit smoking in May 2006 with dx pna/ ards resumed full activity including yardwork at wt 140 and pft's c/w restrictive changes 04/2010 at wt 170 but reported improvement on saba so maintained chronically on symbicort 160  With pfts 02/08/18  c/w GOLD III copd if use FEV1/VC ratio    History of Present Illness  July 18, 2010  1st pulmonary office eval in ER era c/o doe x 6 months progressive indolent onset with copd vs pf.  Ultimately required hosp 8/30-9/1 dx copd/pf ? cause on sulfasalzine and ACE inhib better on 02 and advair but very hoarse with sense of chest congestion and cough with white mucus day > night.  doe x > slow adls.stop advair Start symbicort 160 2 puffs first thing  in am and 2 puffs again in pm about 12 hours later  Work on inhaler technique:   stop benazapril start benicar 40/25  one daily Use 02 sleeping and any activity other than sitting still   04/10/2014 f/u ov/[name] re: restrictive lung dz/ RA/psoriatric/ symbicort 160 2bid  Chief Complaint  Patient presents with   Follow-up    Pt states was advised to f/u per [employer].  Pt has been having increased SOB- relates to stress from dealing with her spouse's illness. She is using o2 pretty much 24/7.   trying to do more than baseline activity and finds she needs 02 more. rec Work on inhaler technique:   Ok to just use the symbicort 160 2 puffs in am to see what difference if any this makes in your breathing  Change 02 to 2lpm 24/7 and use 3lpm with activity    06/04/15  cabg      11/10/2017  f/u ov/[name] re: AB / pf related to ALI vs RA / MO  Chief Complaint  Patient presents with   Follow-up    Breathing has been worse over the past 1-2 months. She states she gets winded just walking from her house to her car. She has been using her albuterol inhaler 4 x daily on average.   on 2lpm at rest and  sleeping needed up to 4lpm continous while doing rehab  but only has pulsed to 3lpm at home per [employer] Much better only while on prednisone / poor hfa noted  Doe = MMRC3 = can't walk 100 yards even at a slow pace at a flat grade s stopping due to sob  Even on 02 but not sure about sats as does not check rec Goal is to keep sats above 90%  - call if you want [employer] to re-evaluate your ambulatory needs  Prednisone 10 mg take  4 each am x 2 days,   2 each am x 2 days,  1 each am x 2 days and stop  Work on inhaler technique:      04/05/18  aecopd > pred x 6 days > back to baseline      10/11/2018  f/u ov/[name] re:  COPD GOLD III/ 02 dep / maint on just symb 160 2bid (no better with incruse) Chief Complaint  Patient presents with   Follow-up    Breathing is the same. She is using her albuterol inhaler once per wk on average.   Dyspnea:  HT, sometimes [employer] on 3lpm MMRC3 = can't walk 100 yards even at a slow  pace at a flat grade s stopping due to sob   Cough: none Sleeping: on back books under hob plus pillows = 30 degrees  SABA use: rare  02: 2lpm but turns to 3lpm poc with activity    rec Prednisone 10 mg take  4 each am x 2 days,   2 each am x 2 days,  1 each am x 2 days and stop  Work on inhaler technique:   Please schedule a follow up visit in 4 months but call sooner if needed  - add consider adding spiriva next ov if not doing better and insurance will cover   04/18/2019  f/u ov/[name] re: GOLD III spirometry but 02 dep/ maint on symb 160 2bid/spiriva  Chief Complaint  Patient presents with   Follow-up    Breathing not improving since the last visit. She states she has not been active at all. She has been using her albuterol daily.   Dyspnea:  No longer shopping due to covid 19 does delivery only  Cough: occ spells/ non productive never noct   Sleeping: on elevated to 30 degrees  SABA use: daily use of nebulizer  02: 2lpm but 3lpm with activity  rec Plan A = Automatic =Symbicort  160 x 2 pffs each am followed by spriiva 2 puffs and 12 hours later symbicort 160 x 2  Work on inhaler technique: Plan B = Backup Only use your albuterol(proventil) inhaler as a rescue medication  Plan C = Crisis - only use your albuterol nebulizer if you first try Plan B and it fails to help > ok to use the nebulizer up to every 4 hours but if start needing it regularly call for immediate appointment    01/01/2021  f/u ov/[name] re:  S/p covid/ rapid afib / off pred since ? And confused with instructions  Chief Complaint  Patient presents with   Follow-up    Recent admission to hospital for COPD flare. Breathing is improving some, but not back to baseline. She has some wheezing and cough with clear sputum. She rarely uses her albuterol inhaler.    Dyspnea:   More limited by balance weakness  Cough: minimal Sleeping: electric bed x 30 degrees SABA use: rarely  02: 2lpm  at rest, sleep, 3 walking sats 98% Covid status:   2 vax  plus infected with likely omicron Rec Sleep medicine evaluation next available  No change in medications  Plan A = Automatic =  spriva 2 puffs each am (work on practice inhalations Instead of symbicort =  Budesonide and formoterol first thing in am and 12 hours later  in nebulizer Plan B = Backup Only use your albuterol(proventil) inhaler as a rescue medication Plan C = Crisis - only use your albuterol nebulizer if you first try Plan B and it fails to help > ok to use the nebulizer up to every 4 hours but if start needing it regularly call for immediate appointment Plan D = Deltasone  If plan C not working well, add Prednisone 10 mg take  4 each am x 2 days,   2 each am x 2 days,  1 each am x 2 days and stop  For nasty mucus >  zpak  Congested cough > mucinex dm 1200 mg every 12 hours and use the flutter valve  Make sure you check your oxygen saturation  at your highest level of activity  to be sure it stays over 90% and adjust  02 flow upward to  maintain this  level if needed but remember to turn it back to previous settings when you stop (to conserve your supply).  Please schedule a follow up visit in 3 months but call sooner if needed    Admit date: 06/17/2021 Discharge date: 06/21/2021 Equipment/Devices: Trilogy Ventilator CODE STATUS: DNR  HPI:  75 y.o. female with medical history significant of CAD s/p CABG, HFpEF, atrial fibrillation on COPD, ILD on home O2 who presents with complaints of increased weakness with upper and lower extremity jerks.  She had went to have a sleep study and was unable to get in the bed to have the study, or back into the car after the study has been postponed.  Patient's daughter helps provide additional history as she is currently on BiPAP.  Apparently, the patient had been having intermittent jerking of her upper and lower extremities for about 1 month.  Jerking episodes were usually brought on with movement and worsening over the last 1-2 weeks.  Symptoms suspect to have started after several medication changes have been made for which patient notes Eliquis, Tikosyn, hydrocodone, and depression medication changes because the previous did not agree with Tikosyn.  Patient had been seen by her cardiologist on 8/11 and at that time her torsemide had been increased from 20 mg daily to 40 mg twice daily. Daughter notes associated symptoms shortness of breath with exertion that seems to have worsened, increased lethargy, and increased cough.  Patient denied any complaints of fever.    Since issues with her right hip she has been not able to ambulate on her own.   Her sleep study had been performed and attempts to get the patient to surgery for her right hip so that she would be able to be ambulatory again.  Patient confirms that she would like to remain a DNR, but is okay with BiPAP and is amendable to intubation if thought to be likely temporary.     Hospital course:   Acute on chronic respiratory failure with hypoxia and  hypercapnia Patient is on 2 to 3 L of oxygen as an outpatient.  Patient required BiPAP on admission for a's PCO2 of 89.9 and a pH of 7.266.  Patient has a history of CO2 retention from recent hospitalization back in February 2022.  Patient was planning to have a sleep study prior to admission.  Currently appears to be at baseline. Patient qualified for non-invasive ventilator to treat chronic hypercapnia. Continue oxygen on discharge.   Acute metabolic encephalopathy Secondary to hypercarbia. Resolved.   AKI on CKD stage IIIb Baseline creatinine of about 1.3.  Creatinine on admission of 2.39 and is trended down with holding torsemide and IV hydration.   Leukocytosis Mild. Resolved. No evidence of infection.   Myoclonic jerking Possibly secondary to Lyrica, although she is on a very low dose. Possibly related to hypercapnia. Resolved.   Chronic diastolic heart failure Patient is on torsemide 40 mg twice daily as an outpatient which was held secondary to AKI. Resume previous dose of torsemide 20 mg BID.   Normocytic anemia Acute on chronic anemia Stable.   Primary hypertension Patient is on losartan, diltiazem as an outpatient. Continue on discharge   COPD Does not appear to have acute exacerbation.  Patient is on Pulmicort, formoterol, Incruse Ellipta, albuterol, Singulair as an outpatient.   Chronic pain Patient is on Lyrica and oxycodone as an outpatient. Continue on discharge.   Paroxysmal atrial fibrillation/flutter Patient is on Tikosyn, diltiazem as an outpatient.  On Eliquis  as an outpatient. Continue on discharge.   CAD History of NSTEMI s/p CABG. On Lipitor as an outpatient. Continue on discharge.   Rheumatoid arthritis Patient is on golimumab as an outpatient   Depression Patient is on Cymbalta as an outpatient. Continue on discharge   Hypothyroidism Patient is on Synthroid 125 mcg daily. Continue on discharge   Morbid obesity Body mass index is 41.63 kg/m.      Discharge Diagnoses:    Acute on chronic respiratory failure with hypoxia and hypercapnia (HCC)   Hypothyroidism   Essential hypertension   PULMONARY FIBROSIS ILD POST INFLAMMATORY CHRONIC   Morbid (severe) obesity due to excess calories (HCC)   S/P CABG x 3   Hyperlipidemia   Severe episode of recurrent major depressive disorder, without psychotic features (HCC)   Paroxysmal atrial fibrillation (HCC)   Chronic heart failure with preserved ejection fraction (HCC) with BNP 100.8  this admit   Leukocytosis   DNR (do not resuscitate)      06/24/2021  f/u ov/[name] re: GOLD III/ 02 dep hypoxemia/ hypercarbic RF    maint on perf/bud/incruse  on NIV Chief Complaint  Patient presents with   Follow-up    Surgery clearance   Dyspnea:  more limited by hip/ bed to w/c existence now and"can't live with the pain anymore"  Cough: not much  Sleeping: NIV x one week @ 30 degrees  SABA use: very little  02: 2lpm rest, 3lpm  Covid status:   x 2 and omicron   No obvious day to day or daytime variability or assoc excess/ purulent sputum or mucus plugs or hemoptysis or cp or chest tightness, subjective wheeze or overt sinus or hb symptoms.   As above without nocturnal  or early am exacerbation  of respiratory  c/o's or need for noct saba. Also denies any obvious fluctuation of symptoms with weather or environmental changes or other aggravating or alleviating factors except as outlined above   No unusual exposure hx or h/o childhood pna/ asthma or knowledge of premature birth.  Current Allergies, Complete Past Medical History, Past Surgical History, Family History, and Social History were reviewed in [employer] record.  ROS  The following are not active complaints unless bolded Hoarseness, sore throat, dysphagia, dental problems, itching, sneezing,  nasal congestion or discharge of excess mucus or purulent secretions, ear ache,   fever, chills, sweats, unintended wt loss or  wt gain, classically pleuritic or exertional cp,  orthopnea pnd or arm/hand swelling  or leg swelling, presyncope, palpitations, abdominal pain, anorexia, nausea, vomiting, diarrhea  or change in bowel habits or change in bladder habits, change in stools or change in urine, dysuria, hematuria,  rash, arthralgias, visual complaints, headache, numbness, weakness or ataxia or problems with walking or coordination,  change in mood or  memory.        Current Meds  Medication Sig   acetaminophen (TYLENOL) 650 MG CR tablet Take 650 mg by mouth in the morning and at bedtime. And as needed for mild pain/headache   albuterol (VENTOLIN HFA) 108 (90 Base) MCG/ACT inhaler Inhale 2 puffs into the lungs every 4 (four) hours as needed (shortness of breath, if you can't catch your breath). (Patient taking differently: Inhale 2 puffs into the lungs every 4 (four) hours as needed for wheezing or shortness of breath (shortness of breath, if you can't catch your breath).)   apixaban (ELIQUIS) 5 MG TABS tablet Take 1 tablet (5 mg total) by mouth 2 (two) times  daily.   atorvastatin (LIPITOR) 40 MG tablet TAKE 1 TABLET BY MOUTH  DAILY AT 6 PM (Patient taking differently: Take 40 mg by mouth daily.)   budesonide (PULMICORT) 0.25 MG/2ML nebulizer solution One twice daily   CALCIUM PO Take 1 tablet by mouth daily.   Cholecalciferol (VITAMIN D3) 2000 UNITS TABS Take 2,000 Int'l Units by mouth daily.   clotrimazole-betamethasone (LOTRISONE) cream Apply 1 application topically 2 (two) times daily as needed. (Patient taking differently: Apply 1 application topically 2 (two) times daily as needed (rash).)   diltiazem (CARDIZEM CD) 240 MG 24 hr capsule Take 1 capsule (240 mg total) by mouth daily.   dofetilide (TIKOSYN) 250 MCG capsule Take 1 capsule (250 mcg total) by mouth 2 (two) times daily.   DULoxetine (CYMBALTA) 30 MG capsule Take 1 capsule (30 mg total) by mouth daily.   ferrous sulfate 325 (65 FE) MG tablet Take 1 tablet  (325 mg total) by mouth daily with breakfast.   formoterol (PERFOROMIST) 20 MCG/2ML nebulizer solution Take 2 mLs (20 mcg total) by nebulization 2 (two) times daily. Use in nebulizer twice daily perfectly regularly   golimumab (SIMPONI ARIA) 50 MG/4ML SOLN injection Inject 50 mg into the vein every 8 (eight) weeks.    ketoconazole (NIZORAL) 2 % cream Apply 1 application topically daily as needed for irritation.   leflunomide (ARAVA) 20 MG tablet Take 1 tablet (20 mg total) by mouth daily.   levothyroxine (SYNTHROID) 125 MCG tablet TAKE 1 TABLET BY MOUTH EVERY DAY (Patient taking differently: Take 125 mcg by mouth daily.)   losartan (COZAAR) 50 MG tablet Take 1 tablet (50 mg total) by mouth daily.   Magnesium Oxide 400 MG CAPS Take 1 capsule (400 mg total) by mouth daily.   montelukast (SINGULAIR) 10 MG tablet Take 10 mg by mouth daily.   nitroGLYCERIN (NITROSTAT) 0.4 MG SL tablet Place 1 tablet (0.4 mg total) under the tongue every 5 (five) minutes as needed for chest pain.   nystatin (MYCOSTATIN/NYSTOP) powder APPLY 1 APPLICATION        TOPICALLY 3 TIMES A DAY (Patient taking differently: Apply 1 application topically 3 (three) times daily as needed (rash).)   Oxycodone HCl 10 MG TABS Take 10 mg by mouth 3 (three) times daily as needed for pain.   OXYGEN Inhale 2 L into the lungs continuous. continuous o2   pantoprazole (PROTONIX) 40 MG tablet Take 1 tablet (40 mg total) by mouth daily before breakfast.   pregabalin (LYRICA) 50 MG capsule Take 50 mg by mouth at bedtime.   torsemide (DEMADEX) 20 MG tablet Take 1 tablet (20 mg total) by mouth 2 (two) times daily.   triamcinolone cream (KENALOG) 0.1 % Apply 1 application topically 2 (two) times daily as needed (for psoriasis).    umeclidinium bromide (INCRUSE ELLIPTA) 62.5 MCG/INH AEPB Inhale 1 puff into the lungs daily.   vitamin B-12 (CYANOCOBALAMIN) 1000 MCG tablet Take 1,000 mcg by mouth daily.                  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  Pna       -  CT chest 07/03/10 Nonspecific PF mostly upper lobes       - CT chest 12/03/10 acute gg changes and effusions c/w chf        - PFT's  04/12/10 FEV1  1.21 (69%) ratio 77 and no change p B2,  DLC0 56% Asthmatic bronchitis     - HFA 50% p coaching July 18, 2010 >>  90%  02/25/2011  history of mild vitamin D deficiency        Objective:   Physical Exam  06/24/2021   202  01/01/2021    201 07/02/2020   208  11/08/2019     204   wt 192 July 18, 2010 > 197 August 21, 2010 > 207 December 05, 2010  > 183  01/21/2011  > 179 02/25/2011 >189 08/10/2012 >195 09/08/12 >200 12/27/12 > 202 05/03/2013 > 06/28/2013  199 > 08/08/13 194 > 04/10/2014  203 > 04/19/2015   176 >  07/23/2015 166 > 09/16/2016  210 >  10/29/2016  209 > 04/29/2017    206 > 11/10/2017   209 > 02/08/2018  207 > 03/24/2018 209> 04/23/2018  208> 10/11/2018   208  > 04/18/2019  203 > 06/14/2019   208 > 07/15/2019   205   Vital signs reviewed  06/24/2021  - Note at rest 02 sats  95% on 2lpm   General appearance:    w/c bound elderly wf nad     HEENT : pt wearing mask not removed for exam due to covid - 19 concerns.    NECK :  without JVD/Nodes/TM/ nl carotid upstrokes bilaterally   LUNGS: no acc muscle use,  Mild barrel  contour chest wall with bilateral  Distant bs s audible wheeze and  without cough on insp or exp maneuvers  and mild  Hyperresonant  to  percussion bilaterally     CV:  RRR  no s3 or murmur or increase in P2, and no edema   ABD:  quite obese soft and nontender with pos end  insp Hoover's  in the supine position. No bruits or organomegaly  appreciated, bowel sounds nl  MS:   Nl gait/  ext warm without deformities, calf tenderness, cyanosis or clubbing No obvious joint restrictions   SKIN: warm and dry without lesions    NEURO:  alert, approp, nl sensorium with  no motor or cerebellar deficits apparent.     I personally reviewed images and agree with radiology impression as follows:  CXR:   06/19/21 portable Changes of CHF slightly worsened when compare with the prior exam.

## 2021-06-26 DIAGNOSIS — M25552 Pain in left hip: Secondary | ICD-10-CM | POA: Diagnosis not present

## 2021-06-26 DIAGNOSIS — J449 Chronic obstructive pulmonary disease, unspecified: Secondary | ICD-10-CM | POA: Diagnosis not present

## 2021-06-26 DIAGNOSIS — M48061 Spinal stenosis, lumbar region without neurogenic claudication: Secondary | ICD-10-CM | POA: Diagnosis not present

## 2021-06-26 DIAGNOSIS — M5416 Radiculopathy, lumbar region: Secondary | ICD-10-CM | POA: Diagnosis not present

## 2021-06-26 DIAGNOSIS — I10 Essential (primary) hypertension: Secondary | ICD-10-CM | POA: Diagnosis not present

## 2021-06-26 DIAGNOSIS — M5430 Sciatica, unspecified side: Secondary | ICD-10-CM | POA: Diagnosis not present

## 2021-06-27 ENCOUNTER — Encounter: Payer: Self-pay | Admitting: Internal Medicine

## 2021-06-27 ENCOUNTER — Other Ambulatory Visit: Payer: Self-pay

## 2021-06-27 ENCOUNTER — Ambulatory Visit (INDEPENDENT_AMBULATORY_CARE_PROVIDER_SITE_OTHER): Payer: Medicare Other | Admitting: Internal Medicine

## 2021-06-27 VITALS — BP 124/80 | HR 79 | Temp 99.3°F | Wt 192.9 lb

## 2021-06-27 DIAGNOSIS — G4733 Obstructive sleep apnea (adult) (pediatric): Secondary | ICD-10-CM

## 2021-06-27 DIAGNOSIS — I252 Old myocardial infarction: Secondary | ICD-10-CM | POA: Diagnosis not present

## 2021-06-27 DIAGNOSIS — G8929 Other chronic pain: Secondary | ICD-10-CM | POA: Diagnosis not present

## 2021-06-27 DIAGNOSIS — M5430 Sciatica, unspecified side: Secondary | ICD-10-CM | POA: Diagnosis not present

## 2021-06-27 DIAGNOSIS — F332 Major depressive disorder, recurrent severe without psychotic features: Secondary | ICD-10-CM | POA: Diagnosis not present

## 2021-06-27 DIAGNOSIS — D72829 Elevated white blood cell count, unspecified: Secondary | ICD-10-CM | POA: Diagnosis not present

## 2021-06-27 DIAGNOSIS — M25552 Pain in left hip: Secondary | ICD-10-CM

## 2021-06-27 DIAGNOSIS — J9621 Acute and chronic respiratory failure with hypoxia: Secondary | ICD-10-CM

## 2021-06-27 DIAGNOSIS — M1611 Unilateral primary osteoarthritis, right hip: Secondary | ICD-10-CM | POA: Diagnosis not present

## 2021-06-27 DIAGNOSIS — E785 Hyperlipidemia, unspecified: Secondary | ICD-10-CM | POA: Diagnosis not present

## 2021-06-27 DIAGNOSIS — L4050 Arthropathic psoriasis, unspecified: Secondary | ICD-10-CM | POA: Diagnosis not present

## 2021-06-27 DIAGNOSIS — M48061 Spinal stenosis, lumbar region without neurogenic claudication: Secondary | ICD-10-CM | POA: Diagnosis not present

## 2021-06-27 DIAGNOSIS — F339 Major depressive disorder, recurrent, unspecified: Secondary | ICD-10-CM | POA: Diagnosis not present

## 2021-06-27 DIAGNOSIS — M4804 Spinal stenosis, thoracic region: Secondary | ICD-10-CM | POA: Diagnosis not present

## 2021-06-27 DIAGNOSIS — J8410 Pulmonary fibrosis, unspecified: Secondary | ICD-10-CM | POA: Diagnosis not present

## 2021-06-27 DIAGNOSIS — I130 Hypertensive heart and chronic kidney disease with heart failure and stage 1 through stage 4 chronic kidney disease, or unspecified chronic kidney disease: Secondary | ICD-10-CM | POA: Diagnosis not present

## 2021-06-27 DIAGNOSIS — I4892 Unspecified atrial flutter: Secondary | ICD-10-CM | POA: Diagnosis not present

## 2021-06-27 DIAGNOSIS — J441 Chronic obstructive pulmonary disease with (acute) exacerbation: Secondary | ICD-10-CM | POA: Diagnosis not present

## 2021-06-27 DIAGNOSIS — D631 Anemia in chronic kidney disease: Secondary | ICD-10-CM | POA: Diagnosis not present

## 2021-06-27 DIAGNOSIS — N1832 Chronic kidney disease, stage 3b: Secondary | ICD-10-CM | POA: Diagnosis not present

## 2021-06-27 DIAGNOSIS — M069 Rheumatoid arthritis, unspecified: Secondary | ICD-10-CM | POA: Diagnosis not present

## 2021-06-27 DIAGNOSIS — J449 Chronic obstructive pulmonary disease, unspecified: Secondary | ICD-10-CM

## 2021-06-27 DIAGNOSIS — M5416 Radiculopathy, lumbar region: Secondary | ICD-10-CM | POA: Diagnosis not present

## 2021-06-27 DIAGNOSIS — E662 Morbid (severe) obesity with alveolar hypoventilation: Secondary | ICD-10-CM | POA: Diagnosis not present

## 2021-06-27 DIAGNOSIS — I5032 Chronic diastolic (congestive) heart failure: Secondary | ICD-10-CM | POA: Diagnosis not present

## 2021-06-27 DIAGNOSIS — I5033 Acute on chronic diastolic (congestive) heart failure: Secondary | ICD-10-CM | POA: Diagnosis not present

## 2021-06-27 DIAGNOSIS — I2510 Atherosclerotic heart disease of native coronary artery without angina pectoris: Secondary | ICD-10-CM | POA: Diagnosis not present

## 2021-06-27 DIAGNOSIS — N179 Acute kidney failure, unspecified: Secondary | ICD-10-CM | POA: Diagnosis not present

## 2021-06-27 DIAGNOSIS — R32 Unspecified urinary incontinence: Secondary | ICD-10-CM | POA: Diagnosis not present

## 2021-06-27 DIAGNOSIS — Z09 Encounter for follow-up examination after completed treatment for conditions other than malignant neoplasm: Secondary | ICD-10-CM

## 2021-06-27 DIAGNOSIS — I480 Paroxysmal atrial fibrillation: Secondary | ICD-10-CM | POA: Diagnosis not present

## 2021-06-27 DIAGNOSIS — J9622 Acute and chronic respiratory failure with hypercapnia: Secondary | ICD-10-CM

## 2021-06-27 MED ORDER — DULOXETINE HCL 40 MG PO CPEP: 40.0000 mg | ORAL_CAPSULE | Freq: Every day | ORAL | 1 refills | Status: DC

## 2021-06-27 NOTE — Progress Notes (Signed)
Hospital follow-up visit     CC/Reason for Visit: Hospitalization follow-up  HPI: [name] is a 75 y.o. female who is coming in today for the above mentioned reasons, specifically transitional care services face-to-face visit.    Dates hospitalized: 06/17/2021-8/19-2022 Days since discharge from hospital: 6 Patient was discharged from the hospital to: Home Reason for admission to hospital: Acute hypercarbic and hypoxemic respiratory failure Date of interactive phone contact with patient and/or caregiver: 06/24/2021  I have reviewed in detail patient's discharge summary plus pertinent specific notes, labs, and images from the hospitalization.  She was hospitalized on 8/15 after she was brought in by her daughter who is her primary caregiver due to confusion, somnolence and intermittent jerking of her upper and lower extremities.  Upon admission she was found to have hypercarbic/hypoxemic respiratory failure with a pH of 7.266 and a PCO2 of 89.9.  She was placed on BiPAP.  Her encephalopathy and jerking movements resolved with resolution of her hypercarbia.  She was set up for a home BiPAP on discharge which she has been using.  She has decided to completely wean herself off oxycodone.  She has been having significant left hip pain.  She has been told by Dr. Lequita Halt that she needs a hip replacement.  She states that she would rather die than have this degree of pain.  She saw Dr. Sherene Sires, pulmonologist, recently for surgical clearance.  She was prepared for worst-case scenario of not coming off the ventilator following surgery and despite this she wants to proceed as pain is debilitating.  I have discussed this in detail with both her and her daughter.  Her daughter is her healthcare power of attorney.  She clearly tells me today that if they are unable to extubate her post surgery then she would prefer a terminal wean.  She is scheduled to see her cardiologist next week for final  surgical clearance.  She has been doing well posthospitalization.  We have clarified her CODE STATUS today she would agree to intubation short-term but not long-term.  Her daughter Cala Bradford is present and agrees with this.  They are asking for an increase in her depression medications.  Patient states "I am at an all-time low".  "This pain is killing me".  Medication reconciliation was done today and patient is taking meds as recommended by discharging hospitalist/specialist.  Yes   Past Medical/Surgical History: Past Medical History:  Diagnosis Date   Arthritis    OA RIGHT KNEE WITH PAIN   Barrett esophagus    Bradycardia 06/01/2015   CAD in native artery    a. NSTEMI 05/2015 s/p emergent CABG.   Chronic diastolic (congestive) heart failure (HCC)    Chronic kidney disease, stage 3a (HCC)    Chronic respiratory failure (HCC)    Chronic respiratory failure with hypoxia and hypercapnia (HCC) 02/04/2010   Followed in Pulmonary clinic/ [facility]/ [place]       - 02 dependent  since 07/02/10 >>  83% RA December 05, 2010       - ONO RA 08/05/12  :  Positive sat < 89 x 2:52m> repeat on 2lpm rec 08/12/2012  - 06/17/2013 reported desat with activity p Knee surgery > rec restart 2lpm with activity  - 06/27/2013   Walked 2lpm  x one lap @ 185 stopped due to sat 88% not sob , desat to 82% on RA just at th   COPD III spirometry if use FEV1/VC p saba  07/18/2010   Quit smoking May 2006       - PFT's  04/12/10 FEV1  1.21 (69%) ratio 77 and no change p B2,  DLC0 56%   VC 70%         - PFTs  08/08/2013 FEV1 1.21 (60%) ratio 86 and no change p B2 DLCO 79%  VC 72%  On symbicort 160 2bid  - PFT's  02/08/2018  FEV1 0.70 (40 % ) ratio 56 if use FEV1/VC  p 38 % improvement from saba p symb 160 prior to study with DLCO  78 % corrects to 147  % for alv volume   - 02/08/2018   Cough variant asthma 02/26/2011   Followed in Pulmonary clinic/ [facility]/ [place]  - PFT's  06/04/15  FEV1 1.20 (67 % ) ratio 83  p 6 % improvement  from saba with DLCO  80 % corrects to 132 % for alv volume      - Clinical dx based on response to symbicort       FENO 09/16/2016  =   96 on symbicort 160 2bid > added singulair  Allergy profile 09/16/2016 >  Eos 0.5 /  IgE  78 neg RAST  -  Referred to rehab 04/29/2017 > completed   Essential hypertension 04/20/2007   Qualifier: Diagnosis of  By: Marcelyn Ditty, RN, Katy Fitch    GERD (gastroesophageal reflux disease)    History of ARDS 2006   History of home oxygen therapy    AT NIGHT WHEN SLEEPING 2 L / MIN NASAL CANNULA   Hyperlipidemia 07/12/2015   Hypothyroidism    Intracranial hemorrhage (HCC) 2019   a. small intracranial hemorrhage in setting of HTN.   Mild carotid artery disease (HCC)    a. Duplex 1-39% bilaterally in 2016.   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   Followed as Primary Care Patient/ [facility]/ [place]  -s/p ARDS 2006 with bacteremic S  Pna       - CT chest 07/03/10 Nonspecific PF mostly upper lobes       - CT chest 12/03/10 acute gg changes and effusions c/w chf - PFT's  02/08/2018  FVC 0.64 (28 %)   with DLCO  78 % corrects to 147 % for alv volume      Rheumatic disease    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   SOB (shortness of breath) on exertion     Past Surgical History:  Procedure Laterality Date   ABDOMINOPLASTY     CARDIAC CATHETERIZATION N/A 06/01/2015   Procedure: Left Heart Cath and Coronary Angiography;  Surgeon: Runell Gess, MD;  Location: [facility] INVASIVE CV LAB;  Service: Cardiovascular;  Laterality: N/A;   CARDIOVERSION N/A 02/12/2021   Procedure: CARDIOVERSION;  Surgeon: Meriam Sprague, MD;  Location: [facility] ENDOSCOPY;  Service: Cardiovascular;  Laterality: N/A;   CARPAL TUNNEL  RELEASE     CHOLECYSTECTOMY     CORONARY ARTERY BYPASS GRAFT N/A 06/04/2015   Procedure: CORONARY ARTERY BYPASS GRAFT times three            with left internal mammary artery and right leg saphenous vein;  Surgeon: Alleen Borne, MD;  Location: [facility] OR;  Service: Open Heart Surgery;  Laterality: N/A;   cosmetic breast surgery     JOINT REPLACEMENT     KNEE ARTHROSCOPY Left    TEE WITHOUT CARDIOVERSION  06/04/2015   Procedure: TRANSESOPHAGEAL ECHOCARDIOGRAM (TEE);  Surgeon: Alleen Borne, MD;  Location: [facility] OR;  Service: Open Heart Surgery;;   TEE WITHOUT CARDIOVERSION N/A 02/12/2021   Procedure: TRANSESOPHAGEAL ECHOCARDIOGRAM (TEE);  Surgeon: Meriam Sprague, MD;  Location: [facility] ENDOSCOPY;  Service: Cardiovascular;  Laterality: N/A;   TOTAL KNEE ARTHROPLASTY Left    TOTAL KNEE ARTHROPLASTY Right 06/06/2013   Procedure: RIGHT TOTAL KNEE ARTHROPLASTY;  Surgeon: Loanne Drilling, MD;  Location: [facility] ORS;  Service: Orthopedics;  Laterality: Right;    Social History:  reports that she quit smoking about 16 years ago. Her smoking use included cigarettes. She has a 87.75 pack-year smoking history. She has never used smokeless tobacco. She reports that she does not drink alcohol and does not use drugs.  Allergies: Allergies  Allergen Reactions   Gabapentin Other (See Comments)    Dizziness, lighthead    Family History:  Family History  Problem Relation Age of Onset   Breast cancer Mother    Coronary artery disease Father    Rheum arthritis Father      Current Outpatient Medications:    acetaminophen (TYLENOL) 650 MG CR tablet, Take 650 mg by mouth in the morning and at bedtime. And as needed for mild pain/headache, Disp: , Rfl:    albuterol (VENTOLIN HFA) 108 (90 Base) MCG/ACT inhaler, Inhale 2 puffs into the lungs every 4 (four) hours as needed (shortness of breath, if you can't catch your breath). (Patient taking differently: Inhale 2 puffs into the lungs every 4 (four) hours as needed for  wheezing or shortness of breath (shortness of breath, if you can't catch your breath).), Disp: 8 g, Rfl: 5   apixaban (ELIQUIS) 5 MG TABS tablet, Take 1 tablet (5 mg total) by mouth 2 (two) times daily., Disp: 56 tablet, Rfl: 0   atorvastatin (LIPITOR) 40 MG tablet, TAKE 1 TABLET BY MOUTH  DAILY AT 6 PM (Patient taking differently: Take 40 mg by mouth daily.), Disp: 90 tablet, Rfl: 3   budesonide (PULMICORT) 0.25 MG/2ML nebulizer solution, One twice daily, Disp: 120 mL, Rfl: 12   CALCIUM PO, Take 1 tablet by mouth daily., Disp: , Rfl:    Cholecalciferol (VITAMIN D3) 2000 UNITS TABS, Take 2,000 Int'l Units by mouth daily., Disp: , Rfl:    clotrimazole-betamethasone (LOTRISONE) cream, Apply 1 application topically 2 (two) times daily as needed. (Patient taking differently: Apply 1 application topically 2 (two) times daily as needed (rash).), Disp: 45 g, Rfl: 0   diltiazem (CARDIZEM CD) 240 MG 24 hr capsule, Take 1 capsule (240 mg total) by mouth daily., Disp: 90 capsule, Rfl: 1   dofetilide (TIKOSYN) 250 MCG capsule, Take 1 capsule (250 mcg total) by mouth 2 (two) times daily., Disp: 60 capsule, Rfl: 3   ferrous sulfate 325 (65 FE) MG tablet, Take 1 tablet (325 mg total) by mouth daily with breakfast., Disp: 30 tablet, Rfl: 0   formoterol (PERFOROMIST) 20 MCG/2ML nebulizer solution, Take 2 mLs (20 mcg total) by nebulization 2 (two) times daily. Use in nebulizer twice daily perfectly regularly, Disp: 120 mL, Rfl: 11   golimumab (SIMPONI ARIA) 50 MG/4ML SOLN injection, Inject 50 mg into the vein every 8 (eight) weeks. , Disp: , Rfl:    ketoconazole (NIZORAL) 2 % cream, Apply 1 application topically daily  as needed for irritation., Disp: , Rfl:    leflunomide (ARAVA) 20 MG tablet, Take 1 tablet (20 mg total) by mouth daily., Disp: , Rfl:    levothyroxine (SYNTHROID) 125 MCG tablet, TAKE 1 TABLET BY MOUTH EVERY DAY (Patient taking differently: Take 125 mcg by mouth daily.), Disp: 90 tablet, Rfl: 1    losartan (COZAAR) 50 MG tablet, Take 1 tablet (50 mg total) by mouth daily., Disp: 90 tablet, Rfl: 3   Magnesium Oxide 400 MG CAPS, Take 1 capsule (400 mg total) by mouth daily., Disp: 90 capsule, Rfl: 3   montelukast (SINGULAIR) 10 MG tablet, Take 10 mg by mouth daily., Disp: , Rfl:    nitroGLYCERIN (NITROSTAT) 0.4 MG SL tablet, Place 1 tablet (0.4 mg total) under the tongue every 5 (five) minutes as needed for chest pain., Disp: 30 tablet, Rfl: 3   nystatin (MYCOSTATIN/NYSTOP) powder, APPLY 1 APPLICATION        TOPICALLY 3 TIMES A DAY (Patient taking differently: Apply 1 application topically 3 (three) times daily as needed (rash).), Disp: 15 g, Rfl: 0   Oxycodone HCl 10 MG TABS, Take 10 mg by mouth 3 (three) times daily as needed for pain., Disp: , Rfl:    OXYGEN, Inhale 2 L into the lungs continuous. continuous o2, Disp: , Rfl:    pantoprazole (PROTONIX) 40 MG tablet, Take 1 tablet (40 mg total) by mouth daily before breakfast., Disp: 90 tablet, Rfl: 3   pregabalin (LYRICA) 50 MG capsule, Take 50 mg by mouth at bedtime., Disp: , Rfl:    torsemide (DEMADEX) 20 MG tablet, Take 1 tablet (20 mg total) by mouth 2 (two) times daily., Disp: , Rfl:    triamcinolone cream (KENALOG) 0.1 %, Apply 1 application topically 2 (two) times daily as needed (for psoriasis). , Disp: , Rfl:    umeclidinium bromide (INCRUSE ELLIPTA) 62.5 MCG/INH AEPB, Inhale 1 puff into the lungs daily., Disp: 3 each, Rfl: 2   vitamin B-12 (CYANOCOBALAMIN) 1000 MCG tablet, Take 1,000 mcg by mouth daily., Disp: , Rfl:    DULoxetine 40 MG CPEP, Take 40 mg by mouth daily., Disp: 90 capsule, Rfl: 1  Review of Systems:  Constitutional: Denies fever, chills, diaphoresis, appetite change . HEENT: Denies photophobia, eye pain, redness, hearing loss, ear pain, congestion, sore throat, rhinorrhea, sneezing, mouth sores, trouble swallowing, neck pain, neck stiffness and tinnitus.   Respiratory: Positive for SOB, DOE, cough, chest tightness,   and wheezing, but all are at baseline. Cardiovascular: Denies chest pain, palpitations. Gastrointestinal: Denies nausea, vomiting, abdominal pain, diarrhea, constipation, blood in stool and abdominal distention.  Genitourinary: Denies dysuria, urgency, frequency, hematuria, flank pain and difficulty urinating.  Endocrine: Denies: hot or cold intolerance, sweats, changes in hair or nails, polyuria, polydipsia. Musculoskeletal: Positive for myalgias, back pain, joint swelling, arthralgias and gait problem.  Skin: Denies pallor, rash and wound.  Neurological: Denies dizziness, seizures, syncope, weakness, light-headedness, numbness and headaches.  Hematological: Denies adenopathy. Easy bruising, personal or family bleeding history  Psychiatric/Behavioral: Denies suicidal ideation,  confusion, nervousness and agitation    Physical Exam: Vitals:   06/27/21 1419  BP: 124/80  Pulse: 79  Temp: 99.3 F (37.4 C)  TempSrc: Oral  SpO2: 93%  Weight: 192 lb 14.4 oz (87.5 kg)    Body mass index is 38.96 kg/m.   Constitutional: NAD, calm, comfortable, obese, wheelchair-bound, wearing oxygen via nasal cannula Eyes: PERRL, lids and conjunctivae normal ENMT: Mucous membranes are moist. Posterior pharynx clear of any exudate or  lesions. Normal dentition. Tympanic membrane is pearly white, no erythema or bulging. Neck: normal, supple, no masses, no thyromegaly Respiratory: Bilateral expiratory wheezes, no crackles normal respiratory effort. No accessory muscle use.  Cardiovascular: Regular rate and rhythm, no murmurs / rubs / gallops. No extremity edema. 2+ pedal pulses. No carotid bruits.  Psychiatric: Normal judgment and insight. Alert and oriented x 3. Normal mood.    Impression and Plan:  Hospital discharge follow-up  Depression, recurrent (HCC) - Plan: DULoxetine 40 MG CPEP  Acute on chronic respiratory failure with hypoxia and hypercapnia (HCC)  Chronic obstructive pulmonary disease,  unspecified COPD type (HCC)  Chronic heart failure with preserved ejection fraction (HCC)  OSA (obstructive sleep apnea)  Obesity hypoventilation syndrome (HCC)  Morbid (severe) obesity due to excess calories (HCC)  Left hip pain  -Hospital charts have been reviewed in detail. -We have decided to increase her Cymbalta from 30 to 40 mg to see if that can help with her depressed mood.  We also think that if the surgery is successful and is able to relieve some of her brain that her depression will also improve. -She has a follow-up next week with cardiology for final clearance prior to surgery. -I have okayed an occasional extra torsemide dose for lower extremity edema. -We have spent a lot of time today discussing CODE STATUS.  Patient is very clear in that if she is unable to be extubated following surgery she would want a terminal wean.  Her daughter and healthcare power of attorney Cala Bradford is present.  We all agree on this.    Medical decision making of high complexity was utilized today.    Chaya Jan, MD [place] [name]

## 2021-06-30 NOTE — Progress Notes (Signed)
Cardiology Office Note:    Date:  07/01/2021   ID:  Eilah, Bradle Jan 04, 1946, MRN [id number]  PCP:  [name], [name], MD   [facility]  Cardiologist:  Riley Lam MD Electrophysiologist:  Lewayne Bunting, MD   CC: COPD and ILD follow up  History of Present Illness:    [name] is a 75 y.o. female with a hx of hx of CAD s/p NSTEMI with 3 VD s/p LIMA but with preserved EF; COPD and ILD on Home O2 diastolic HF seen by Dr. Delton See.  Seen 11/30/20 for virtual visit for SVT.  In interim of this visit, patient had heart monitor notable for AFl.  Seen by AF clinic, stared on [facility], had some LE edema issues on placed on torsemide.  Last seen 01/24/21 In interim of this visit, patient had ED visit for chest pain (doesn't have memory of this).  05/24/21.  Since last visit saw EP 06/03/21.  No evidence of CHF at that time.  Seen 06/06/21.  We increased her diuretics and ended up having worsening kideny function.  Breathing was largely pulmonary process.  Seen 07/01/21  Patient notes that she is doing well.  Since last visit notes persistent hip pain but breathing has improved ( I saw her inpatient).  No chest pain or pressure .  No SOB/DOE and no PND/Orthopnea.  No weight gain or leg swelling.  No palpitations or syncope .    Past Medical History:  Diagnosis Date   Arthritis    OA RIGHT KNEE WITH PAIN   Barrett esophagus    Bradycardia 06/01/2015   CAD in native artery    a. NSTEMI 05/2015 s/p emergent CABG.   Chronic diastolic (congestive) heart failure (HCC)    Chronic kidney disease, stage 3a (HCC)    Chronic respiratory failure (HCC)    Chronic respiratory failure with hypoxia and hypercapnia (HCC) 02/04/2010   Followed in Pulmonary clinic/ [facility]       - 02 dependent  since 07/02/10 >>  83% RA December 05, 2010       - ONO RA 08/05/12  :  Positive sat < 89 x 2:34m> repeat on 2lpm rec 08/12/2012  - 06/17/2013 reported desat with  activity p Knee surgery > rec restart 2lpm with activity  - 06/27/2013   Walked 2lpm  x one lap @ 185 stopped due to sat 88% not sob , desat to 82% on RA just at th   COPD III spirometry if use FEV1/VC p saba  07/18/2010   Quit smoking May 2006       - PFT's  04/12/10 FEV1  1.21 (69%) ratio 77 and no change p B2,  DLC0 56%   VC 70%         - PFTs  08/08/2013 FEV1 1.21 (60%) ratio 86 and no change p B2 DLCO 79%  VC 72%  On symbicort 160 2bid  - PFT's  02/08/2018  FEV1 0.70 (40 % ) ratio 56 if use FEV1/VC  p 38 % improvement from saba p symb 160 prior to study with DLCO  78 % corrects to 147  % for alv volume   - 02/08/2018   Cough variant asthma 02/26/2011   Followed in Pulmonary clinic/ [facility]  - PFT's  06/04/15  FEV1 1.20 (67 % ) ratio 83  p 6 % improvement from saba with DLCO  80 % corrects to 132 % for alv  volume      - Clinical dx based on response to symbicort       FENO 09/16/2016  =   96 on symbicort 160 2bid > added singulair  Allergy profile 09/16/2016 >  Eos 0.5 /  IgE  78 neg RAST  -  Referred to rehab 04/29/2017 > completed   Essential hypertension 04/20/2007   Qualifier: Diagnosis of  By: Marcelyn Ditty, RN, Katy Fitch    GERD (gastroesophageal reflux disease)    History of ARDS 2006   History of home oxygen therapy    AT NIGHT WHEN SLEEPING 2 L / MIN NASAL CANNULA   Hyperlipidemia 07/12/2015   Hypothyroidism    Intracranial hemorrhage (HCC) 2019   a. small intracranial hemorrhage in setting of HTN.   Mild carotid artery disease (HCC)    a. Duplex 1-39% bilaterally in 2016.   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   Followed as Primary Care Patient/ [facility]  -s/p ARDS 2006 with  bacteremic S  Pna       - CT chest 07/03/10 Nonspecific PF mostly upper lobes       - CT chest 12/03/10 acute gg changes and effusions c/w chf - PFT's  02/08/2018  FVC 0.64 (28 %)   with DLCO  78 % corrects to 147 % for alv volume      Rheumatic disease    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   SOB (shortness of breath) on exertion     Past Surgical History:  Procedure Laterality Date   ABDOMINOPLASTY     CARDIAC CATHETERIZATION N/A 06/01/2015   Procedure: Left Heart Cath and Coronary Angiography;  Surgeon: Runell Gess, MD;  Location: [facility] INVASIVE CV LAB;  Service: Cardiovascular;  Laterality: N/A;   CARDIOVERSION N/A 02/12/2021   Procedure: CARDIOVERSION;  Surgeon: Meriam Sprague, MD;  Location: [facility] ENDOSCOPY;  Service: Cardiovascular;  Laterality: N/A;   CARPAL TUNNEL RELEASE     CHOLECYSTECTOMY     CORONARY ARTERY BYPASS GRAFT N/A 06/04/2015   Procedure: CORONARY ARTERY BYPASS GRAFT times three            with left internal mammary artery and right leg saphenous vein;  Surgeon: Alleen Borne, MD;  Location: [facility] OR;  Service: Open Heart Surgery;  Laterality: N/A;   cosmetic breast surgery     JOINT REPLACEMENT     KNEE ARTHROSCOPY Left    TEE WITHOUT CARDIOVERSION  06/04/2015   Procedure: TRANSESOPHAGEAL ECHOCARDIOGRAM (TEE);  Surgeon: Alleen Borne, MD;  Location: [facility] OR;  Service: Open Heart Surgery;;   TEE WITHOUT CARDIOVERSION N/A 02/12/2021   Procedure: TRANSESOPHAGEAL ECHOCARDIOGRAM (TEE);  Surgeon: Meriam Sprague, MD;  Location: [facility] ENDOSCOPY;  Service: Cardiovascular;  Laterality: N/A;   TOTAL KNEE ARTHROPLASTY Left    TOTAL KNEE ARTHROPLASTY Right 06/06/2013   Procedure: RIGHT TOTAL KNEE ARTHROPLASTY;  Surgeon: Loanne Drilling, MD;  Location: [facility] ORS;  Service: Orthopedics;  Laterality: Right;    Current Medications: Current Meds  Medication Sig   acetaminophen (TYLENOL) 650 MG CR tablet Take 650 mg by mouth in the morning and at bedtime. And as needed for mild pain/headache   albuterol  (VENTOLIN  HFA) 108 (90 Base) MCG/ACT inhaler Inhale 2 puffs into the lungs every 4 (four) hours as needed (shortness of breath, if you can't catch your breath).   apixaban (ELIQUIS) 5 MG TABS tablet Take 1 tablet (5 mg total) by mouth 2 (two) times daily.   atorvastatin (LIPITOR) 40 MG tablet Take 40 mg by mouth daily.   budesonide (PULMICORT) 0.25 MG/2ML nebulizer solution One twice daily   CALCIUM PO Take 1 tablet by mouth daily.   Cholecalciferol (VITAMIN D3) 2000 UNITS TABS Take 2,000 Int'l Units by mouth daily.   clotrimazole-betamethasone (LOTRISONE) cream Apply 1 application topically 2 (two) times daily as needed.   diltiazem (CARDIZEM CD) 240 MG 24 hr capsule Take 1 capsule (240 mg total) by mouth daily.   dofetilide (TIKOSYN) 250 MCG capsule Take 1 capsule (250 mcg total) by mouth 2 (two) times daily.   DULoxetine 40 MG CPEP Take 40 mg by mouth daily.   ferrous sulfate 325 (65 FE) MG tablet Take 1 tablet (325 mg total) by mouth daily with breakfast.   formoterol (PERFOROMIST) 20 MCG/2ML nebulizer solution Take 2 mLs (20 mcg total) by nebulization 2 (two) times daily. Use in nebulizer twice daily perfectly regularly   golimumab (SIMPONI ARIA) 50 MG/4ML SOLN injection Inject 50 mg into the vein every 8 (eight) weeks.    ketoconazole (NIZORAL) 2 % cream Apply 1 application topically daily as needed for irritation.   leflunomide (ARAVA) 20 MG tablet Take 1 tablet (20 mg total) by mouth daily.   levothyroxine (SYNTHROID) 125 MCG tablet TAKE 1 TABLET BY MOUTH EVERY DAY   losartan (COZAAR) 50 MG tablet Take 1 tablet (50 mg total) by mouth daily.   Magnesium Oxide 400 MG CAPS Take 1 capsule (400 mg total) by mouth daily.   montelukast (SINGULAIR) 10 MG tablet Take 10 mg by mouth daily.   nitroGLYCERIN (NITROSTAT) 0.4 MG SL tablet Place 1 tablet (0.4 mg total) under the tongue every 5 (five) minutes as needed for chest pain.   nystatin (MYCOSTATIN/NYSTOP) powder APPLY 1 APPLICATION         TOPICALLY 3 TIMES A DAY (Patient taking differently: Apply 1 application topically 3 (three) times daily as needed (rash).)   OXYGEN Inhale 2 L into the lungs continuous. continuous o2   pantoprazole (PROTONIX) 40 MG tablet Take 1 tablet (40 mg total) by mouth daily before breakfast.   pregabalin (LYRICA) 50 MG capsule Take 50 mg by mouth at bedtime.   torsemide (DEMADEX) 20 MG tablet Take 1 tablet (20 mg total) by mouth 2 (two) times daily.   traMADol (ULTRAM) 50 MG tablet Take 50 mg by mouth as needed.   triamcinolone cream (KENALOG) 0.1 % Apply 1 application topically 2 (two) times daily as needed (for psoriasis).    umeclidinium bromide (INCRUSE ELLIPTA) 62.5 MCG/INH AEPB Inhale 1 puff into the lungs daily.   vitamin B-12 (CYANOCOBALAMIN) 1000 MCG tablet Take 1,000 mcg by mouth daily.   [DISCONTINUED] atorvastatin (LIPITOR) 40 MG tablet TAKE 1 TABLET BY MOUTH  DAILY AT 6 PM (Patient taking differently: TAKE 1 TABLET BY MOUTH  DAILY AT 6 PM)     Allergies:   Gabapentin   Social History   Socioeconomic History   Marital status: Widowed    Spouse name: Not on file   Number of children: Not on file   Years of education: Not on file   Highest education level: Not on file  Occupational History   Occupation: retired  Employer: [employer]  Tobacco Use   Smoking status: Former    Packs/day: 1.50    Years: 58.50    Pack years: 87.75    Types: Cigarettes    Quit date: 03/03/2005    Years since quitting: 16.3   Smokeless tobacco: Never  Vaping Use   Vaping Use: Never used  Substance and Sexual Activity   Alcohol use: No   Drug use: No   Sexual activity: Not on file  Other Topics Concern   Not on file  Social History Narrative   Not on file   Social Determinants of Health   Financial Resource Strain: Medium Risk   Difficulty of Paying Living Expenses: Somewhat hard  Food Insecurity: Not on file  Transportation Needs: No Transportation Needs   Lack of Transportation  (Medical): No   Lack of Transportation (Non-Medical): No  Physical Activity: Not on file  Stress: Not on file  Social Connections: Not on file    Social:  I see her and her daughter frequently; saw son inpatient, she has a former husband cared for by Dr. Delton See  Family History: The patient's family history includes Breast cancer in her mother; Coronary artery disease in her father; Rheum arthritis in her father.  ROS:   Please see the history of present illness.     All other systems reviewed and are negative.  EKGs/Labs/Other Studies Reviewed:    The following studies were reviewed today:  EKG:   06/06/21:  SR rate 91 Baseline artifact Qtc 460 05/24/21: SR 1st HB rate 78 QTc 460 01/07/21: SR rate 74 RAE  Cardiac Event Monitoring: Date:12/24/20 Results: Patient had a minimum heart rate of 65 bpm, maximum heart rate of 214 bpm, and average heart rate of 104 bpm. Predominant underlying rhythm was sinus rhythm with 1st heart block. One run of non-sustained ventricular tachycardia occurred lasting 6 beats at longest with a max rate of 214 bpm at fastest. Isolated PACs were occasional (3.6%), with rare couplets and triplets present. Isolated PVCs were rare (<1.0%), with rare couplets and trigeminy present. Atrial Tachycardia vs Atrial Flutter occurred (37% burden), ranging from 65-198 bpm (avg of 133 bpm), the longest lasting 2 days 16 hours with an avg rate of 135 bpm. Present at activation and de-activation of device. Triggered and diary events associated with sinus rhythm, atrial tachycardia, atrial flutter, and PVCs.   Likely both atrial tachycardia and atrial flutter is present.   Transthoracic Echocardiogram: Date: 12/27/20 Results: 1. Left ventricular ejection fraction, by estimation, is 60 to 65%. The  left ventricle has normal function. The left ventricle has no regional  wall motion abnormalities. There is severe left ventricular hypertrophy.  Left ventricular diastolic  parameters   are indeterminate.   2. Right ventricular systolic function is normal. The right ventricular  size is normal.   3. Left atrial size was moderately dilated.   4. The mitral valve is normal in structure. No evidence of mitral valve  regurgitation. No evidence of mitral stenosis. Moderate mitral annular  calcification.   5. The aortic valve is normal in structure. Aortic valve regurgitation is  not visualized. No aortic stenosis is present.   6. The inferior vena cava is dilated in size with <50% respiratory  variability, suggesting right atrial pressure of 15 mmHg.   Left/Right Heart Catheterizations: Date:06/01/2015 Results: Prox LAD to Mid LAD lesion, 75% stenosed. Prox Cx lesion, 95% stenosed. Prox RCA to Mid RCA lesion, 90% stenosed. The left ventricular systolic function is  normal.    Recent Labs: 12/28/2020: TSH 0.752 06/13/2021: NT-Pro BNP 588 06/18/2021: B Natriuretic Peptide 100.8 06/19/2021: ALT 13; Hemoglobin 9.1; Magnesium 2.2; Platelets 190 06/20/2021: BUN 47; Creatinine, Ser 1.37; Potassium 4.3; Sodium 141  Recent Lipid Panel    Component Value Date/Time   CHOL 164 01/18/2020 1110   CHOL 117 07/12/2015 1508   TRIG 117 01/18/2020 1110   TRIG 75 07/12/2015 1508   HDL 56 01/18/2020 1110   HDL 51 07/12/2015 1508   CHOLHDL 2.9 01/18/2020 1110   CHOLHDL 2.3 08/25/2018 2019   VLDL 22 08/25/2018 2019   LDLCALC 87 01/18/2020 1110   LDLCALC 51 07/12/2015 1508   LDLDIRECT 131.1 08/10/2012 0829    Risk Assessment/Calculations:    CHA2DS2-VASc Score = 7  This indicates a 11.2% annual risk of stroke. The patient's score is based upon: CHF History: Yes HTN History: Yes Diabetes History: No Stroke History: Yes Vascular Disease History: Yes Age Score: 1 Gender Score: 1      Physical Exam:    VS:  BP 112/68   Pulse 82   Ht  (1.499 m)   Wt 198 lb (89.8 kg)   SpO2 97%   BMI 39.99 kg/m     Wt Readings from Last 3 Encounters:  07/01/21 198  lb (89.8 kg)  06/27/21 192 lb 14.4 oz (87.5 kg)  06/24/21 202 lb (91.6 kg)    GEN: Obese female well developed in no acute distress HEENT: Frank's Signs NECK: No JVD LYMPHATICS: No lymphadenopathy CARDIAC: RRR, no murmurs, rubs, gallops RESPIRATORY:  Good air movement and expiratory wheezes ABDOMEN: Soft, non-tender, non-distended MUSCULOSKELETAL:  +2 edema; No deformity  SKIN: Warm and dry NEUROLOGIC:  Alert and oriented x 3, resting tremor both arms and legs PSYCHIATRIC:  Normal affect   ASSESSMENT:    1. Acute on chronic diastolic heart failure (HCC)   2. Chronic heart failure with preserved ejection fraction (HCC)   3. Chronic obstructive pulmonary disease, unspecified COPD type (HCC)   4. Paroxysmal atrial fibrillation (HCC)   5. S/P CABG x 3       PLAN:    COPD Heart Failure Preserved Ejection Fraction  Morbid Obesity HTN CKD IIIb - NYHA class II, Stage III, eurvolemic, etiology multifactorial - Diuretic regimen: Torsemide 20 mg BID - BMP today  Coronary Artery Disease; Obstructive s/p CABG - asymptomatic - anatomy: CABGx3 (LIMA-LAD, SVG-OM1, SVG-RCA) ~ 2016 - on Eliquis and no ASA - continue statin, goal LDL < 70 ; will recheck at next visit if not done with PCP - NO BB b/c of COPD - no Ranexa for Qtc - No PRN nitro  Paroxysmal Atrial Flutter - CHADSVASC=7. - Continue anticoagulation with Eliquis; at age 58 may meet decreased dose critereia - Continue rate control with diltiazem 240 mg  - Rhythm control options limited by pulmonary disease - Seeing Dr. Ladona Ridgel and s/p Tikosyn load   Four to six weeks follow up unless new symptoms or abnormal test results warranting change in plan- if stable will risks stratify for hip surgery with Dr. Despina Hick     Medication Adjustments/Labs and Tests Ordered: Current medicines are reviewed at length with the patient today.  Concerns regarding medicines are outlined above.  No orders of the defined types were placed  in this encounter.    No orders of the defined types were placed in this encounter.     There are no Patient Instructions on file for this visit.   Signed, Ninamarie Keel A  Izora Ribas, MD  07/01/2021 10:36 AM    [facility]

## 2021-07-01 ENCOUNTER — Encounter: Payer: Self-pay | Admitting: Internal Medicine

## 2021-07-01 ENCOUNTER — Ambulatory Visit (INDEPENDENT_AMBULATORY_CARE_PROVIDER_SITE_OTHER): Payer: Medicare Other | Admitting: Internal Medicine

## 2021-07-01 ENCOUNTER — Telehealth: Payer: Self-pay | Admitting: Internal Medicine

## 2021-07-01 ENCOUNTER — Other Ambulatory Visit: Payer: Self-pay

## 2021-07-01 VITALS — BP 112/68 | HR 82 | Ht 59.0 in | Wt 198.0 lb

## 2021-07-01 DIAGNOSIS — J449 Chronic obstructive pulmonary disease, unspecified: Secondary | ICD-10-CM | POA: Diagnosis not present

## 2021-07-01 DIAGNOSIS — Z951 Presence of aortocoronary bypass graft: Secondary | ICD-10-CM

## 2021-07-01 DIAGNOSIS — Z79899 Other long term (current) drug therapy: Secondary | ICD-10-CM | POA: Diagnosis not present

## 2021-07-01 DIAGNOSIS — I480 Paroxysmal atrial fibrillation: Secondary | ICD-10-CM | POA: Diagnosis not present

## 2021-07-01 DIAGNOSIS — I5033 Acute on chronic diastolic (congestive) heart failure: Secondary | ICD-10-CM

## 2021-07-01 DIAGNOSIS — M0589 Other rheumatoid arthritis with rheumatoid factor of multiple sites: Secondary | ICD-10-CM | POA: Diagnosis not present

## 2021-07-01 DIAGNOSIS — I5032 Chronic diastolic (congestive) heart failure: Secondary | ICD-10-CM

## 2021-07-01 LAB — BASIC METABOLIC PANEL
BUN/Creatinine Ratio: 22 (ref 12–28)
BUN: 33 mg/dL — ABNORMAL HIGH (ref 8–27)
BUN: 35 — AB (ref 4–21)
CO2: 32 mmol/L — ABNORMAL HIGH (ref 20–29)
Calcium: 9.4 mg/dL (ref 8.7–10.3)
Chloride: 96 mmol/L (ref 96–106)
Creatinine, Ser: 1.53 mg/dL — ABNORMAL HIGH (ref 0.57–1.00)
Creatinine: 1.5 — AB (ref 0.5–1.1)
Glucose: 109 mg/dL — ABNORMAL HIGH (ref 65–99)
Glucose: 128
Potassium: 4.4 mmol/L (ref 3.5–5.2)
Sodium: 142 (ref 137–147)
Sodium: 142 mmol/L (ref 134–144)
eGFR: 35 mL/min/{1.73_m2} — ABNORMAL LOW (ref >59)

## 2021-07-01 LAB — CBC AND DIFFERENTIAL
HCT: 32 — AB (ref 36–46)
Hemoglobin: 9.6 — AB (ref 12.0–16.0)
WBC: 10

## 2021-07-01 LAB — CBC: RBC: 3.58 — AB (ref 3.87–5.11)

## 2021-07-01 NOTE — Telephone Encounter (Signed)
Shelia from [facility] is calling for a verbal order to continue therapy for Patient. Frequency is "one week one, two week four." If cannot be reach please leave voicemail with verbal order.      Good callback [phone]

## 2021-07-01 NOTE — Patient Instructions (Signed)
Medication Instructions:  Your physician recommends that you continue on your current medications as directed. Please refer to the Current Medication list given to you today.  *If you need a refill on your cardiac medications before your next appointment, please call your pharmacy*   Lab Work: TODAY: BMP If you have labs (blood work) drawn today and your tests are completely normal, you will receive your results only by: MyChart Message (if you have MyChart) OR A paper copy in the mail If you have any lab test that is abnormal or we need to change your treatment, we will call you to review the results.   Testing/Procedures: NONE   Follow-Up: At [facility], you and your health needs are our priority.  As part of our continuing mission to provide you with exceptional heart care, we have created designated Provider Care Teams.  These Care Teams include your primary Cardiologist (physician) and Advanced Practice Providers (APPs -  Physician Assistants and Nurse Practitioners) who all work together to provide you with the care you need, when you need it.  We recommend signing up for the patient portal called "MyChart".  Sign up information is provided on this After Visit Summary.  MyChart is used to connect with patients for Virtual Visits (Telemedicine).  Patients are able to view lab/test results, encounter notes, upcoming appointments, etc.  Non-urgent messages can be sent to your provider as well.   To learn more about what you can do with MyChart, go to [email or web address].    Your next appointment:   4-6  week(s)  The format for your next appointment:   In Person  Provider:   You may see Riley Lam, MD or one of the following Advanced Practice Providers on your designated Care Team:   Ronie Spies, PA-C Jacolyn Reedy, PA-C

## 2021-07-02 ENCOUNTER — Telehealth: Payer: Self-pay

## 2021-07-02 NOTE — Telephone Encounter (Signed)
Patient called stated her rheumatologist informed pt hemoglobin levels were 9.3 and was told to contact her PCP.

## 2021-07-02 NOTE — Telephone Encounter (Signed)
Verbal orders for PT given to [facility]

## 2021-07-02 NOTE — Telephone Encounter (Signed)
Patient is aware 

## 2021-07-03 ENCOUNTER — Encounter: Payer: Self-pay | Admitting: Internal Medicine

## 2021-07-03 DIAGNOSIS — N1832 Chronic kidney disease, stage 3b: Secondary | ICD-10-CM | POA: Diagnosis not present

## 2021-07-03 DIAGNOSIS — J9622 Acute and chronic respiratory failure with hypercapnia: Secondary | ICD-10-CM | POA: Diagnosis not present

## 2021-07-03 DIAGNOSIS — J9621 Acute and chronic respiratory failure with hypoxia: Secondary | ICD-10-CM | POA: Diagnosis not present

## 2021-07-03 DIAGNOSIS — I130 Hypertensive heart and chronic kidney disease with heart failure and stage 1 through stage 4 chronic kidney disease, or unspecified chronic kidney disease: Secondary | ICD-10-CM | POA: Diagnosis not present

## 2021-07-03 DIAGNOSIS — I5033 Acute on chronic diastolic (congestive) heart failure: Secondary | ICD-10-CM | POA: Diagnosis not present

## 2021-07-03 DIAGNOSIS — J441 Chronic obstructive pulmonary disease with (acute) exacerbation: Secondary | ICD-10-CM | POA: Diagnosis not present

## 2021-07-05 DIAGNOSIS — N1832 Chronic kidney disease, stage 3b: Secondary | ICD-10-CM | POA: Diagnosis not present

## 2021-07-05 DIAGNOSIS — Z20822 Contact with and (suspected) exposure to covid-19: Secondary | ICD-10-CM | POA: Diagnosis not present

## 2021-07-05 DIAGNOSIS — I5033 Acute on chronic diastolic (congestive) heart failure: Secondary | ICD-10-CM | POA: Diagnosis not present

## 2021-07-05 DIAGNOSIS — I130 Hypertensive heart and chronic kidney disease with heart failure and stage 1 through stage 4 chronic kidney disease, or unspecified chronic kidney disease: Secondary | ICD-10-CM | POA: Diagnosis not present

## 2021-07-05 DIAGNOSIS — J9621 Acute and chronic respiratory failure with hypoxia: Secondary | ICD-10-CM | POA: Diagnosis not present

## 2021-07-05 DIAGNOSIS — J441 Chronic obstructive pulmonary disease with (acute) exacerbation: Secondary | ICD-10-CM | POA: Diagnosis not present

## 2021-07-05 DIAGNOSIS — J9622 Acute and chronic respiratory failure with hypercapnia: Secondary | ICD-10-CM | POA: Diagnosis not present

## 2021-07-10 DIAGNOSIS — J441 Chronic obstructive pulmonary disease with (acute) exacerbation: Secondary | ICD-10-CM | POA: Diagnosis not present

## 2021-07-10 DIAGNOSIS — N1832 Chronic kidney disease, stage 3b: Secondary | ICD-10-CM | POA: Diagnosis not present

## 2021-07-10 DIAGNOSIS — I5033 Acute on chronic diastolic (congestive) heart failure: Secondary | ICD-10-CM | POA: Diagnosis not present

## 2021-07-10 DIAGNOSIS — J9621 Acute and chronic respiratory failure with hypoxia: Secondary | ICD-10-CM | POA: Diagnosis not present

## 2021-07-10 DIAGNOSIS — J9622 Acute and chronic respiratory failure with hypercapnia: Secondary | ICD-10-CM | POA: Diagnosis not present

## 2021-07-10 DIAGNOSIS — I130 Hypertensive heart and chronic kidney disease with heart failure and stage 1 through stage 4 chronic kidney disease, or unspecified chronic kidney disease: Secondary | ICD-10-CM | POA: Diagnosis not present

## 2021-07-12 DIAGNOSIS — I5033 Acute on chronic diastolic (congestive) heart failure: Secondary | ICD-10-CM | POA: Diagnosis not present

## 2021-07-12 DIAGNOSIS — J9621 Acute and chronic respiratory failure with hypoxia: Secondary | ICD-10-CM | POA: Diagnosis not present

## 2021-07-12 DIAGNOSIS — J9622 Acute and chronic respiratory failure with hypercapnia: Secondary | ICD-10-CM | POA: Diagnosis not present

## 2021-07-12 DIAGNOSIS — N1832 Chronic kidney disease, stage 3b: Secondary | ICD-10-CM | POA: Diagnosis not present

## 2021-07-12 DIAGNOSIS — I130 Hypertensive heart and chronic kidney disease with heart failure and stage 1 through stage 4 chronic kidney disease, or unspecified chronic kidney disease: Secondary | ICD-10-CM | POA: Diagnosis not present

## 2021-07-12 DIAGNOSIS — J441 Chronic obstructive pulmonary disease with (acute) exacerbation: Secondary | ICD-10-CM | POA: Diagnosis not present

## 2021-07-16 DIAGNOSIS — I252 Old myocardial infarction: Secondary | ICD-10-CM | POA: Diagnosis not present

## 2021-07-16 DIAGNOSIS — M8580 Other specified disorders of bone density and structure, unspecified site: Secondary | ICD-10-CM | POA: Diagnosis not present

## 2021-07-16 DIAGNOSIS — M150 Primary generalized (osteo)arthritis: Secondary | ICD-10-CM | POA: Diagnosis not present

## 2021-07-16 DIAGNOSIS — Z6841 Body Mass Index (BMI) 40.0 and over, adult: Secondary | ICD-10-CM | POA: Diagnosis not present

## 2021-07-16 DIAGNOSIS — M0589 Other rheumatoid arthritis with rheumatoid factor of multiple sites: Secondary | ICD-10-CM | POA: Diagnosis not present

## 2021-07-16 DIAGNOSIS — Z79899 Other long term (current) drug therapy: Secondary | ICD-10-CM | POA: Diagnosis not present

## 2021-07-16 DIAGNOSIS — L409 Psoriasis, unspecified: Secondary | ICD-10-CM | POA: Diagnosis not present

## 2021-07-16 DIAGNOSIS — M5136 Other intervertebral disc degeneration, lumbar region: Secondary | ICD-10-CM | POA: Diagnosis not present

## 2021-07-16 DIAGNOSIS — Z111 Encounter for screening for respiratory tuberculosis: Secondary | ICD-10-CM | POA: Diagnosis not present

## 2021-07-16 DIAGNOSIS — R5383 Other fatigue: Secondary | ICD-10-CM | POA: Diagnosis not present

## 2021-07-17 DIAGNOSIS — I130 Hypertensive heart and chronic kidney disease with heart failure and stage 1 through stage 4 chronic kidney disease, or unspecified chronic kidney disease: Secondary | ICD-10-CM | POA: Diagnosis not present

## 2021-07-17 DIAGNOSIS — J9621 Acute and chronic respiratory failure with hypoxia: Secondary | ICD-10-CM | POA: Diagnosis not present

## 2021-07-17 DIAGNOSIS — N1832 Chronic kidney disease, stage 3b: Secondary | ICD-10-CM | POA: Diagnosis not present

## 2021-07-17 DIAGNOSIS — J9622 Acute and chronic respiratory failure with hypercapnia: Secondary | ICD-10-CM | POA: Diagnosis not present

## 2021-07-17 DIAGNOSIS — I5033 Acute on chronic diastolic (congestive) heart failure: Secondary | ICD-10-CM | POA: Diagnosis not present

## 2021-07-17 DIAGNOSIS — J441 Chronic obstructive pulmonary disease with (acute) exacerbation: Secondary | ICD-10-CM | POA: Diagnosis not present

## 2021-07-19 DIAGNOSIS — I5033 Acute on chronic diastolic (congestive) heart failure: Secondary | ICD-10-CM | POA: Diagnosis not present

## 2021-07-19 DIAGNOSIS — J441 Chronic obstructive pulmonary disease with (acute) exacerbation: Secondary | ICD-10-CM | POA: Diagnosis not present

## 2021-07-19 DIAGNOSIS — N1832 Chronic kidney disease, stage 3b: Secondary | ICD-10-CM | POA: Diagnosis not present

## 2021-07-19 DIAGNOSIS — I130 Hypertensive heart and chronic kidney disease with heart failure and stage 1 through stage 4 chronic kidney disease, or unspecified chronic kidney disease: Secondary | ICD-10-CM | POA: Diagnosis not present

## 2021-07-19 DIAGNOSIS — J9621 Acute and chronic respiratory failure with hypoxia: Secondary | ICD-10-CM | POA: Diagnosis not present

## 2021-07-19 DIAGNOSIS — J9622 Acute and chronic respiratory failure with hypercapnia: Secondary | ICD-10-CM | POA: Diagnosis not present

## 2021-07-20 ENCOUNTER — Telehealth: Payer: Self-pay

## 2021-07-20 NOTE — Telephone Encounter (Signed)
Called and LVM for patient to call and schedule AWV. Sw, cma

## 2021-07-22 ENCOUNTER — Telehealth: Payer: Self-pay | Admitting: Internal Medicine

## 2021-07-22 ENCOUNTER — Other Ambulatory Visit: Payer: Self-pay | Admitting: Internal Medicine

## 2021-07-22 DIAGNOSIS — F332 Major depressive disorder, recurrent severe without psychotic features: Secondary | ICD-10-CM

## 2021-07-22 DIAGNOSIS — I130 Hypertensive heart and chronic kidney disease with heart failure and stage 1 through stage 4 chronic kidney disease, or unspecified chronic kidney disease: Secondary | ICD-10-CM | POA: Diagnosis not present

## 2021-07-22 DIAGNOSIS — J441 Chronic obstructive pulmonary disease with (acute) exacerbation: Secondary | ICD-10-CM | POA: Diagnosis not present

## 2021-07-22 DIAGNOSIS — N1832 Chronic kidney disease, stage 3b: Secondary | ICD-10-CM | POA: Diagnosis not present

## 2021-07-22 DIAGNOSIS — I5033 Acute on chronic diastolic (congestive) heart failure: Secondary | ICD-10-CM | POA: Diagnosis not present

## 2021-07-22 DIAGNOSIS — J9621 Acute and chronic respiratory failure with hypoxia: Secondary | ICD-10-CM | POA: Diagnosis not present

## 2021-07-22 DIAGNOSIS — J9622 Acute and chronic respiratory failure with hypercapnia: Secondary | ICD-10-CM | POA: Diagnosis not present

## 2021-07-22 NOTE — Telephone Encounter (Signed)
Pt daughter call and stated she want Fleet Contras to give her a call back about pt.her # is [phone].

## 2021-07-23 DIAGNOSIS — Z20822 Contact with and (suspected) exposure to covid-19: Secondary | ICD-10-CM | POA: Diagnosis not present

## 2021-07-23 NOTE — Telephone Encounter (Signed)
Daughter is calling to see if she could still speak to [name].  Please advise.

## 2021-07-23 NOTE — Telephone Encounter (Signed)
Patient has a productive cough that worsens at night. Patient is also complaining of "flutters in her chest, but no pain". Cardiologist is aware. Daughter is worried that it may be pneumonia.  Please advise.

## 2021-07-24 DIAGNOSIS — J9621 Acute and chronic respiratory failure with hypoxia: Secondary | ICD-10-CM | POA: Diagnosis not present

## 2021-07-24 DIAGNOSIS — I5033 Acute on chronic diastolic (congestive) heart failure: Secondary | ICD-10-CM | POA: Diagnosis not present

## 2021-07-24 DIAGNOSIS — I130 Hypertensive heart and chronic kidney disease with heart failure and stage 1 through stage 4 chronic kidney disease, or unspecified chronic kidney disease: Secondary | ICD-10-CM | POA: Diagnosis not present

## 2021-07-24 DIAGNOSIS — N1832 Chronic kidney disease, stage 3b: Secondary | ICD-10-CM | POA: Diagnosis not present

## 2021-07-24 DIAGNOSIS — J9622 Acute and chronic respiratory failure with hypercapnia: Secondary | ICD-10-CM | POA: Diagnosis not present

## 2021-07-24 DIAGNOSIS — J441 Chronic obstructive pulmonary disease with (acute) exacerbation: Secondary | ICD-10-CM | POA: Diagnosis not present

## 2021-07-24 NOTE — Telephone Encounter (Signed)
Daughter is aware.  She will send a mychart message with the Covid test result.

## 2021-07-25 ENCOUNTER — Other Ambulatory Visit: Payer: Self-pay

## 2021-07-25 ENCOUNTER — Encounter: Payer: Self-pay | Admitting: Pulmonary Disease

## 2021-07-25 ENCOUNTER — Ambulatory Visit (INDEPENDENT_AMBULATORY_CARE_PROVIDER_SITE_OTHER): Payer: Medicare Other | Admitting: Pulmonary Disease

## 2021-07-25 ENCOUNTER — Telehealth: Payer: Self-pay | Admitting: Pulmonary Disease

## 2021-07-25 ENCOUNTER — Telehealth: Payer: Self-pay | Admitting: *Deleted

## 2021-07-25 ENCOUNTER — Telehealth: Payer: Self-pay | Admitting: Internal Medicine

## 2021-07-25 VITALS — BP 124/74 | HR 91 | Temp 97.6°F | Ht 59.0 in | Wt 203.5 lb

## 2021-07-25 DIAGNOSIS — J9612 Chronic respiratory failure with hypercapnia: Secondary | ICD-10-CM

## 2021-07-25 DIAGNOSIS — J9611 Chronic respiratory failure with hypoxia: Secondary | ICD-10-CM

## 2021-07-25 MED ORDER — PREDNISONE 20 MG PO TABS: ORAL_TABLET | ORAL | 0 refills | Status: DC

## 2021-07-25 MED ORDER — DOXYCYCLINE HYCLATE 100 MG PO TABS: 100.0000 mg | ORAL_TABLET | Freq: Two times a day (BID) | ORAL | 0 refills | Status: DC

## 2021-07-25 NOTE — Telephone Encounter (Signed)
[facility] [place] on [address]. Patient is aware.  Nothing further needed at this time.

## 2021-07-25 NOTE — Telephone Encounter (Signed)
Patient called to verify appointments and let office know not to reschedule telephone appointment for the 27th with Pharmacist. She spoke with someone about rescheduling as she got appointments mixed up. As of right now appointments have not been changed

## 2021-07-25 NOTE — Telephone Encounter (Signed)
PT called to advise test results were negative and she can be seen after 3:30 today. There is no apts available for today. Please advise.

## 2021-07-25 NOTE — Telephone Encounter (Signed)
-----   Message from Albertine Patricia, CMA sent at 07/25/2021  3:26 PM EDT ----- Regarding: CX tomorrow's appt Hey, Ms Kaleta daughter called regarding her upcoming appt with [facility] pharmacist next week and asked that I cx her appt for tomorrow with Dr Ardyth Harps, she has talked with her pulmonologist and has been sent in prednisone and antibiotic, she asked that I cx appt and send you a message, thanks!  Burman Nieves, CCMA Care Guide, Embedded Care [facility] Management  Direct Dial: [phone]

## 2021-07-25 NOTE — Telephone Encounter (Signed)
Shelia from [facility] called to request a extension on verbal orders:  Twice a week for 8 weeks for strengthening, balancing, and gait training  Please call to get clarification and if no answer please leave a VM.

## 2021-07-25 NOTE — Telephone Encounter (Signed)
Appointment scheduled 07/26/21.

## 2021-07-25 NOTE — Patient Instructions (Signed)
I will see you about 3 months from now  Give [place] a call about the company managing the CPAP so that we can contact them for information  I will call you in a prescription for prednisone and doxycycline  Continue using your nebulizer  Call with any significant concerns

## 2021-07-25 NOTE — Telephone Encounter (Signed)
Left detailed message on machine on confidential voicemail with verbal orders for  [facility] from [facility]

## 2021-07-25 NOTE — Telephone Encounter (Signed)
noted 

## 2021-07-26 ENCOUNTER — Telehealth: Payer: Self-pay | Admitting: Pulmonary Disease

## 2021-07-26 ENCOUNTER — Ambulatory Visit: Payer: Medicare Other | Admitting: Internal Medicine

## 2021-07-26 NOTE — Telephone Encounter (Signed)
Patient wanted to let us know that her DME is [employer].   AO, are you needing a cpap download for her?

## 2021-07-26 NOTE — Telephone Encounter (Signed)
I called and spoke with patient regarding meds that Dr. Wynona Neat has sent in yesterday. [facility] [place] [facility] but he didn't". I called and [facility] and [facility] stated meds were ready for patient to pick up. I called and notified patient they were ready. Patient verbalized understanding, nothing further needed.

## 2021-07-26 NOTE — Progress Notes (Signed)
[name]    [id number]    Jul 30, 1946  Primary Care Physician:Hernandez Priscella Mann, MD  Referring Physician: [name], [name], MD [address] [place],  [place] [address]  Chief complaint:   Patient being seen for respiratory failure History of hypercapnic respiratory failure  HPI:  Recently discharged from the hospital, discharged on a trilogy ventilator At some point was scheduled for sleep study which she was unable to perform and subsequently after that was admitted to the hospital and discharged on a trilogy CO2 level was about 90  Since she is left the hospital she has been doing well Trying to get used to the machine  Usually tries to place it on every night but sometimes from moving around at night, nudges it off She is multiple mask changes  Past history of multiple pneumonias, ARDS, restrictive lung disease  Has a history of coronary artery disease, s/p CABG Heart failure with preserved ejection fraction, atrial fibrillation  Outpatient Encounter Medications as of 07/25/2021  Medication Sig   acetaminophen (TYLENOL) 650 MG CR tablet Take 650 mg by mouth in the morning and at bedtime. And as needed for mild pain/headache   albuterol (VENTOLIN HFA) 108 (90 Base) MCG/ACT inhaler Inhale 2 puffs into the lungs every 4 (four) hours as needed (shortness of breath, if you can't catch your breath).   apixaban (ELIQUIS) 5 MG TABS tablet Take 1 tablet (5 mg total) by mouth 2 (two) times daily.   atorvastatin (LIPITOR) 40 MG tablet Take 40 mg by mouth daily.   budesonide (PULMICORT) 0.25 MG/2ML nebulizer solution One twice daily   CALCIUM PO Take 1 tablet by mouth daily.   Cholecalciferol (VITAMIN D3) 2000 UNITS TABS Take 2,000 Int'l Units by mouth daily.   clotrimazole-betamethasone (LOTRISONE) cream Apply 1 application topically 2 (two) times daily as needed.   diltiazem (CARDIZEM CD) 240 MG 24 hr capsule Take 1 capsule (240 mg  total) by mouth daily.   dofetilide (TIKOSYN) 250 MCG capsule Take 1 capsule (250 mcg total) by mouth 2 (two) times daily.   DULoxetine 40 MG CPEP Take 40 mg by mouth daily.   ferrous sulfate 325 (65 FE) MG tablet Take 1 tablet (325 mg total) by mouth daily with breakfast.   formoterol (PERFOROMIST) 20 MCG/2ML nebulizer solution Take 2 mLs (20 mcg total) by nebulization 2 (two) times daily. Use in nebulizer twice daily perfectly regularly   golimumab (SIMPONI ARIA) 50 MG/4ML SOLN injection Inject 50 mg into the vein every 8 (eight) weeks.    ketoconazole (NIZORAL) 2 % cream Apply 1 application topically daily as needed for irritation.   leflunomide (ARAVA) 20 MG tablet Take 1 tablet (20 mg total) by mouth daily.   levothyroxine (SYNTHROID) 125 MCG tablet TAKE 1 TABLET BY MOUTH EVERY DAY   losartan (COZAAR) 50 MG tablet Take 1 tablet (50 mg total) by mouth daily.   Magnesium Oxide 400 MG CAPS Take 1 capsule (400 mg total) by mouth daily.   montelukast (SINGULAIR) 10 MG tablet Take 10 mg by mouth daily.   nitroGLYCERIN (NITROSTAT) 0.4 MG SL tablet Place 1 tablet (0.4 mg total) under the tongue every 5 (five) minutes as needed for chest pain.   nystatin (MYCOSTATIN/NYSTOP) powder APPLY 1 APPLICATION        TOPICALLY 3 TIMES A DAY (Patient taking differently: Apply 1 application topically 3 (three) times daily as needed (rash).)   OXYGEN Inhale 2 L  into the lungs continuous. continuous o2   pantoprazole (PROTONIX) 40 MG tablet Take 1 tablet (40 mg total) by mouth daily before breakfast.   pregabalin (LYRICA) 50 MG capsule Take 50 mg by mouth at bedtime.   torsemide (DEMADEX) 20 MG tablet Take 1 tablet (20 mg total) by mouth 2 (two) times daily.   traMADol (ULTRAM) 50 MG tablet Take 50 mg by mouth as needed.   triamcinolone cream (KENALOG) 0.1 % Apply 1 application topically 2 (two) times daily as needed (for psoriasis).    umeclidinium bromide (INCRUSE ELLIPTA) 62.5 MCG/INH AEPB Inhale 1 puff into  the lungs daily.   vitamin B-12 (CYANOCOBALAMIN) 1000 MCG tablet Take 1,000 mcg by mouth daily.   [DISCONTINUED] doxycycline (VIBRA-TABS) 100 MG tablet Take 1 tablet (100 mg total) by mouth 2 (two) times daily.   [DISCONTINUED] predniSONE (DELTASONE) 20 MG tablet 1 tablet daily for 7 days   No facility-administered encounter medications on file as of 07/25/2021.    Allergies as of 07/25/2021 - Review Complete 07/25/2021  Allergen Reaction Noted   Gabapentin Other (See Comments) 01/03/2021    Past Medical History:  Diagnosis Date   Arthritis    OA RIGHT KNEE WITH PAIN   Barrett esophagus    Bradycardia 06/01/2015   CAD in native artery    a. NSTEMI 05/2015 s/p emergent CABG.   Chronic diastolic (congestive) heart failure (HCC)    Chronic kidney disease, stage 3a (HCC)    Chronic respiratory failure (HCC)    Chronic respiratory failure with hypoxia and hypercapnia (HCC) 02/04/2010   Followed in Pulmonary clinic/ [facility]       - 02 dependent  since 07/02/10 >>  83% RA December 05, 2010       - ONO RA 08/05/12  :  Positive sat < 89 x 2:43m> repeat on 2lpm rec 08/12/2012  - 06/17/2013 reported desat with activity p Knee surgery > rec restart 2lpm with activity  - 06/27/2013   Walked 2lpm  x one lap @ 185 stopped due to sat 88% not sob , desat to 82% on RA just at th   COPD III spirometry if use FEV1/VC p saba  07/18/2010   Quit smoking May 2006       - PFT's  04/12/10 FEV1  1.21 (69%) ratio 77 and no change p B2,  DLC0 56%   VC 70%         - PFTs  08/08/2013 FEV1 1.21 (60%) ratio 86 and no change p B2 DLCO 79%  VC 72%  On symbicort 160 2bid  - PFT's  02/08/2018  FEV1 0.70 (40 % ) ratio 56 if use FEV1/VC  p 38 % improvement from saba p symb 160 prior to study with DLCO  78 % corrects to 147  % for alv volume   - 02/08/2018   Cough variant asthma 02/26/2011   Followed in Pulmonary clinic/ [facility]  - PFT's  06/04/15  FEV1 1.20 (67 % ) ratio 83  p 6 % improvement from saba with  DLCO  80 % corrects to 132 % for alv volume      - Clinical dx based on response to symbicort       FENO 09/16/2016  =   96 on symbicort 160 2bid > added singulair  Allergy profile 09/16/2016 >  Eos 0.5 /  IgE  78 neg RAST  -  Referred to rehab 04/29/2017 > completed   Essential hypertension  04/20/2007   Qualifier: Diagnosis of  By: Marcelyn Ditty RN, Katy Fitch    GERD (gastroesophageal reflux disease)    History of ARDS 2006   History of home oxygen therapy    AT NIGHT WHEN SLEEPING 2 L / MIN NASAL CANNULA   Hyperlipidemia 07/12/2015   Hypothyroidism    Intracranial hemorrhage (HCC) 2019   a. small intracranial hemorrhage in setting of HTN.   Mild carotid artery disease (HCC)    a. Duplex 1-39% bilaterally in 2016.   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   Followed as Primary Care Patient/ [facility]  -s/p ARDS 2006 with bacteremic S  Pna       - CT chest 07/03/10 Nonspecific PF mostly upper lobes       - CT chest 12/03/10 acute gg changes and effusions c/w chf - PFT's  02/08/2018  FVC 0.64 (28 %)   with DLCO  78 % corrects to 147 % for alv volume      Rheumatic disease    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   SOB (shortness of breath) on exertion     Past Surgical History:  Procedure Laterality Date   ABDOMINOPLASTY     CARDIAC CATHETERIZATION N/A 06/01/2015   Procedure: Left Heart Cath and Coronary Angiography;  Surgeon: Runell Gess, MD;  Location: [facility] INVASIVE CV LAB;  Service: Cardiovascular;  Laterality: N/A;   CARDIOVERSION N/A 02/12/2021   Procedure: CARDIOVERSION;  Surgeon: Meriam Sprague, MD;  Location: [facility] ENDOSCOPY;  Service: Cardiovascular;  Laterality: N/A;   CARPAL TUNNEL RELEASE      CHOLECYSTECTOMY     CORONARY ARTERY BYPASS GRAFT N/A 06/04/2015   Procedure: CORONARY ARTERY BYPASS GRAFT times three            with left internal mammary artery and right leg saphenous vein;  Surgeon: Alleen Borne, MD;  Location: [facility] OR;  Service: Open Heart Surgery;  Laterality: N/A;   cosmetic breast surgery     JOINT REPLACEMENT     KNEE ARTHROSCOPY Left    TEE WITHOUT CARDIOVERSION  06/04/2015   Procedure: TRANSESOPHAGEAL ECHOCARDIOGRAM (TEE);  Surgeon: Alleen Borne, MD;  Location: [facility] OR;  Service: Open Heart Surgery;;   TEE WITHOUT CARDIOVERSION N/A 02/12/2021   Procedure: TRANSESOPHAGEAL ECHOCARDIOGRAM (TEE);  Surgeon: Meriam Sprague, MD;  Location: [facility] ENDOSCOPY;  Service: Cardiovascular;  Laterality: N/A;   TOTAL KNEE ARTHROPLASTY Left    TOTAL KNEE ARTHROPLASTY Right 06/06/2013   Procedure: RIGHT TOTAL KNEE ARTHROPLASTY;  Surgeon: Loanne Drilling, MD;  Location: [facility] ORS;  Service: Orthopedics;  Laterality: Right;    Family History  Problem Relation Age of Onset   Breast cancer Mother    Coronary artery disease Father    Rheum arthritis Father     Social History   Socioeconomic History   Marital status: Widowed    Spouse name: Not on file   Number of children: Not on file   Years of education: Not on file   Highest education level: Not on file  Occupational History   Occupation: retired    Associate Professor: [employer]  Tobacco Use   Smoking status: Former    Packs/day: 1.50  Years: 58.50    Pack years: 87.75    Types: Cigarettes    Quit date: 03/03/2005    Years since quitting: 16.4   Smokeless tobacco: Never  Vaping Use   Vaping Use: Never used  Substance and Sexual Activity   Alcohol use: No   Drug use: No   Sexual activity: Not on file  Other Topics Concern   Not on file  Social History Narrative   Not on file   Social Determinants of Health   Financial Resource Strain: Medium Risk   Difficulty of Paying Living Expenses: Somewhat hard  Food Insecurity:  Not on file  Transportation Needs: Not on file  Physical Activity: Not on file  Stress: Not on file  Social Connections: Not on file  Intimate Partner Violence: Not on file    Review of Systems  Respiratory:  Positive for shortness of breath.   Psychiatric/Behavioral:  Positive for sleep disturbance.    Vitals:   07/25/21 1451  BP: 124/74  Pulse: 91  Temp: 97.6 F (36.4 C)  SpO2: 98%     Physical Exam Constitutional:      Appearance: She is obese.  HENT:     Head: Normocephalic.     Mouth/Throat:     Mouth: Mucous membranes are moist.  Eyes:     Pupils: Pupils are equal, round, and reactive to light.  Cardiovascular:     Rate and Rhythm: Normal rate and regular rhythm.     Heart sounds: No murmur heard.   No friction rub.  Pulmonary:     Effort: No respiratory distress.     Breath sounds: No stridor. No wheezing or rhonchi.  Musculoskeletal:     Cervical back: No rigidity or tenderness.  Neurological:     Mental Status: She is alert.  Psychiatric:        Mood and Affect: Mood normal.     Data Reviewed: Compliance data reviewed Average hours use of 5.7 Multiple days of nonusage Average tidal volume of 443, average respiratory rate of 20 Average minute ventilation 8.8 L/min  Assessment:  Chronic hypercapnic respiratory failure -On trilogy -Improving compliance recommended  Having significant mask issues  Cough and congestion are present may be secondary to a lower respiratory infection -We will call in a course of prednisone and doxycycline  Encouraged to continue using her nebulizer on a regular basis  Plan/Recommendations: Tentative follow-up in about 3 months  Encouraged to continue using Trelegy on a regular basis  Bicarb on Chem-7 does appear improved compared to when she had the very elevated carbon dioxide levels  Encouraged to call with significant concerns  Will continue to try and get downloads from her machine  DME company  [employer]   Virl Diamond MD [facility] 07/26/2021, 2:55 PM  CC: [name], [place]*

## 2021-07-27 DIAGNOSIS — N1832 Chronic kidney disease, stage 3b: Secondary | ICD-10-CM | POA: Diagnosis not present

## 2021-07-27 DIAGNOSIS — D72829 Elevated white blood cell count, unspecified: Secondary | ICD-10-CM | POA: Diagnosis not present

## 2021-07-27 DIAGNOSIS — M48061 Spinal stenosis, lumbar region without neurogenic claudication: Secondary | ICD-10-CM | POA: Diagnosis not present

## 2021-07-27 DIAGNOSIS — R32 Unspecified urinary incontinence: Secondary | ICD-10-CM | POA: Diagnosis not present

## 2021-07-27 DIAGNOSIS — I252 Old myocardial infarction: Secondary | ICD-10-CM | POA: Diagnosis not present

## 2021-07-27 DIAGNOSIS — D631 Anemia in chronic kidney disease: Secondary | ICD-10-CM | POA: Diagnosis not present

## 2021-07-27 DIAGNOSIS — I2510 Atherosclerotic heart disease of native coronary artery without angina pectoris: Secondary | ICD-10-CM | POA: Diagnosis not present

## 2021-07-27 DIAGNOSIS — E785 Hyperlipidemia, unspecified: Secondary | ICD-10-CM | POA: Diagnosis not present

## 2021-07-27 DIAGNOSIS — I5033 Acute on chronic diastolic (congestive) heart failure: Secondary | ICD-10-CM | POA: Diagnosis not present

## 2021-07-27 DIAGNOSIS — J8410 Pulmonary fibrosis, unspecified: Secondary | ICD-10-CM | POA: Diagnosis not present

## 2021-07-27 DIAGNOSIS — M5416 Radiculopathy, lumbar region: Secondary | ICD-10-CM | POA: Diagnosis not present

## 2021-07-27 DIAGNOSIS — J9612 Chronic respiratory failure with hypercapnia: Secondary | ICD-10-CM | POA: Diagnosis not present

## 2021-07-27 DIAGNOSIS — M4804 Spinal stenosis, thoracic region: Secondary | ICD-10-CM | POA: Diagnosis not present

## 2021-07-27 DIAGNOSIS — M069 Rheumatoid arthritis, unspecified: Secondary | ICD-10-CM | POA: Diagnosis not present

## 2021-07-27 DIAGNOSIS — J449 Chronic obstructive pulmonary disease, unspecified: Secondary | ICD-10-CM | POA: Diagnosis not present

## 2021-07-27 DIAGNOSIS — E039 Hypothyroidism, unspecified: Secondary | ICD-10-CM | POA: Diagnosis not present

## 2021-07-27 DIAGNOSIS — M1611 Unilateral primary osteoarthritis, right hip: Secondary | ICD-10-CM | POA: Diagnosis not present

## 2021-07-27 DIAGNOSIS — I480 Paroxysmal atrial fibrillation: Secondary | ICD-10-CM | POA: Diagnosis not present

## 2021-07-27 DIAGNOSIS — M5430 Sciatica, unspecified side: Secondary | ICD-10-CM | POA: Diagnosis not present

## 2021-07-27 DIAGNOSIS — J9611 Chronic respiratory failure with hypoxia: Secondary | ICD-10-CM | POA: Diagnosis not present

## 2021-07-27 DIAGNOSIS — I4892 Unspecified atrial flutter: Secondary | ICD-10-CM | POA: Diagnosis not present

## 2021-07-27 DIAGNOSIS — F332 Major depressive disorder, recurrent severe without psychotic features: Secondary | ICD-10-CM | POA: Diagnosis not present

## 2021-07-27 DIAGNOSIS — G8929 Other chronic pain: Secondary | ICD-10-CM | POA: Diagnosis not present

## 2021-07-27 DIAGNOSIS — I130 Hypertensive heart and chronic kidney disease with heart failure and stage 1 through stage 4 chronic kidney disease, or unspecified chronic kidney disease: Secondary | ICD-10-CM | POA: Diagnosis not present

## 2021-07-27 DIAGNOSIS — L4050 Arthropathic psoriasis, unspecified: Secondary | ICD-10-CM | POA: Diagnosis not present

## 2021-07-29 ENCOUNTER — Encounter: Payer: Self-pay | Admitting: Internal Medicine

## 2021-07-29 ENCOUNTER — Other Ambulatory Visit: Payer: Self-pay

## 2021-07-29 ENCOUNTER — Telehealth: Payer: Self-pay | Admitting: Pharmacist

## 2021-07-29 ENCOUNTER — Ambulatory Visit (INDEPENDENT_AMBULATORY_CARE_PROVIDER_SITE_OTHER): Payer: Medicare Other | Admitting: Internal Medicine

## 2021-07-29 VITALS — BP 140/68 | HR 82 | Ht 59.0 in | Wt 200.4 lb

## 2021-07-29 DIAGNOSIS — I480 Paroxysmal atrial fibrillation: Secondary | ICD-10-CM | POA: Diagnosis not present

## 2021-07-29 DIAGNOSIS — Z66 Do not resuscitate: Secondary | ICD-10-CM

## 2021-07-29 DIAGNOSIS — I10 Essential (primary) hypertension: Secondary | ICD-10-CM | POA: Diagnosis not present

## 2021-07-29 DIAGNOSIS — J449 Chronic obstructive pulmonary disease, unspecified: Secondary | ICD-10-CM | POA: Diagnosis not present

## 2021-07-29 DIAGNOSIS — I2510 Atherosclerotic heart disease of native coronary artery without angina pectoris: Secondary | ICD-10-CM

## 2021-07-29 DIAGNOSIS — Z951 Presence of aortocoronary bypass graft: Secondary | ICD-10-CM | POA: Diagnosis not present

## 2021-07-29 DIAGNOSIS — J8410 Pulmonary fibrosis, unspecified: Secondary | ICD-10-CM

## 2021-07-29 NOTE — Patient Instructions (Addendum)
Medication Instructions:  Your physician recommends that you continue on your current medications as directed. Please refer to the Current Medication list given to you today.   *If you need a refill on your cardiac medications before your next appointment, please call your pharmacy*   Lab Work: NONE If you have labs (blood work) drawn today and your tests are completely normal, you will receive your results only by: MyChart Message (if you have MyChart) OR A paper copy in the mail If you have any lab test that is abnormal or we need to change your treatment, we will call you to review the results.   Testing/Procedures: Your physician has requested that you have an echocardiogram. Echocardiography is a painless test that uses sound waves to create images of your heart. It provides your doctor with information about the size and shape of your heart and how well your heart's chambers and valves are working. This procedure takes approximately one hour. There are no restrictions for this procedure.    Follow-Up: At [facility], you and your health needs are our priority.  As part of our continuing mission to provide you with exceptional heart care, we have created designated Provider Care Teams.  These Care Teams include your primary Cardiologist (physician) and Advanced Practice Providers (APPs -  Physician Assistants and Nurse Practitioners) who all work together to provide you with the care you need, when you need it.   Your next appointment:   Jan. 27, 2023  The format for your next appointment:   In Person  Provider:   You may see Christell Constant, MD or one of the following Advanced Practice Providers on your designated Care Team:   Ronie Spies, PA-C Jacolyn Reedy, PA-C

## 2021-07-29 NOTE — Chronic Care Management (AMB) (Signed)
Chronic Care Management Pharmacy Assistant   Name: [name]  MRN: [id number] DOB: 11-02-1946  07-29-2021 APPOINTMENT REMINDER   Called [name], No answer, left message of appointment on 07-30-2021 at 12 via telephone visit with Gaylord Shih, Pharm D. Notified to have all medications, supplements, blood pressure and/or blood sugar logs available during appointment and to return call if need to reschedule.   Care Gaps: AWV - 2019 - MSG sent to [name] CMA to schedule. Hepatitis C Screening - Overdue Zoster Vaccines - Overdue COVID Booster#3 Proofreader) - Overdue Flu Vaccine - Overdue  Star Rating Drug: Losartan (Cozaar) 50 mg - Last filled 07-20-2021 [facility] Atorvastatin (Lipitor) 40 mg - Last filled 06-16-2021 90 DS at [facility]  Any gaps in medications fill history? None  Medications: Outpatient Encounter Medications as of 07/29/2021  Medication Sig   acetaminophen (TYLENOL) 650 MG CR tablet Take 650 mg by mouth in the morning and at bedtime. And as needed for mild pain/headache   albuterol (VENTOLIN HFA) 108 (90 Base) MCG/ACT inhaler Inhale 2 puffs into the lungs every 4 (four) hours as needed (shortness of breath, if you can't catch your breath).   apixaban (ELIQUIS) 5 MG TABS tablet Take 1 tablet (5 mg total) by mouth 2 (two) times daily.   atorvastatin (LIPITOR) 40 MG tablet Take 40 mg by mouth daily.   budesonide (PULMICORT) 0.25 MG/2ML nebulizer solution One twice daily   CALCIUM PO Take 1 tablet by mouth daily.   Cholecalciferol (VITAMIN D3) 2000 UNITS TABS Take 2,000 Int'l Units by mouth daily.   clotrimazole-betamethasone (LOTRISONE) cream Apply 1 application topically 2 (two) times daily as needed.   diltiazem (CARDIZEM CD) 240 MG 24 hr capsule Take 1 capsule (240 mg total) by mouth daily.   dofetilide (TIKOSYN) 250 MCG capsule Take 1 capsule (250 mcg total) by mouth 2 (two) times daily.   doxycycline (VIBRA-TABS) 100 MG tablet Take 1 tablet  (100 mg total) by mouth 2 (two) times daily.   DULoxetine 40 MG CPEP Take 40 mg by mouth daily.   ferrous sulfate 325 (65 FE) MG tablet Take 1 tablet (325 mg total) by mouth daily with breakfast.   formoterol (PERFOROMIST) 20 MCG/2ML nebulizer solution Take 2 mLs (20 mcg total) by nebulization 2 (two) times daily. Use in nebulizer twice daily perfectly regularly   golimumab (SIMPONI ARIA) 50 MG/4ML SOLN injection Inject 50 mg into the vein every 8 (eight) weeks.    ketoconazole (NIZORAL) 2 % cream Apply 1 application topically daily as needed for irritation.   leflunomide (ARAVA) 20 MG tablet Take 1 tablet (20 mg total) by mouth daily.   levothyroxine (SYNTHROID) 125 MCG tablet TAKE 1 TABLET BY MOUTH EVERY DAY   losartan (COZAAR) 50 MG tablet Take 1 tablet (50 mg total) by mouth daily.   Magnesium Oxide 400 MG CAPS Take 1 capsule (400 mg total) by mouth daily.   montelukast (SINGULAIR) 10 MG tablet Take 10 mg by mouth daily.   nitroGLYCERIN (NITROSTAT) 0.4 MG SL tablet Place 1 tablet (0.4 mg total) under the tongue every 5 (five) minutes as needed for chest pain.   nystatin (MYCOSTATIN/NYSTOP) powder APPLY 1 APPLICATION        TOPICALLY 3 TIMES A DAY (Patient taking differently: Apply 1 application topically 3 (three) times daily as needed (rash).)   OXYGEN Inhale 2 L into the lungs continuous. continuous o2   pantoprazole (PROTONIX) 40 MG tablet Take 1 tablet (  40 mg total) by mouth daily before breakfast.   predniSONE (DELTASONE) 20 MG tablet 1 tablet daily for 7 days   pregabalin (LYRICA) 50 MG capsule Take 50 mg by mouth at bedtime.   torsemide (DEMADEX) 20 MG tablet Take 1 tablet (20 mg total) by mouth 2 (two) times daily.   traMADol (ULTRAM) 50 MG tablet Take 50 mg by mouth as needed.   triamcinolone cream (KENALOG) 0.1 % Apply 1 application topically 2 (two) times daily as needed (for psoriasis).    umeclidinium bromide (INCRUSE ELLIPTA) 62.5 MCG/INH AEPB Inhale 1 puff into the lungs daily.    vitamin B-12 (CYANOCOBALAMIN) 1000 MCG tablet Take 1,000 mcg by mouth daily.   No facility-administered encounter medications on file as of 07/29/2021.   Pamala Duffel CMA Clinical Pharmacist Assistant [phone]

## 2021-07-29 NOTE — Progress Notes (Signed)
Cardiology Office Note:    Date:  07/29/2021   ID:  Amber, Huff 09-04-1946, MRN [id number]  PCP:  [name], Amber Patricia, MD   [facility]  Cardiologist:  Amber Lam MD Electrophysiologist:  Amber Bunting, MD   CC: Cardiac risk stratification  History of Present Illness:    [name] is a 75 y.o. female with a hx of hx of CAD s/p NSTEMI with 3 VD s/p LIMA but with preserved EF; COPD and ILD on Home O2 diastolic HF seen by Dr. Delton Huff.  Seen 11/30/20 for virtual visit for SVT.  In interim of this visit, patient had heart monitor notable for AFl.  Seen by AF clinic, stared on [facility], had some LE edema issues on placed on torsemide.  Last seen 01/24/21 In interim of this visit, patient had ED visit for chest pain (doesn't have memory of this).  05/24/21.  Since last visit saw EP 06/03/21.  No evidence of CHF at that time.  Seen 06/06/21.  We increased her diuretics and ended up having worsening kideny function.  Breathing was largely pulmonary process- was seen 07/01/21 in f/u.  Seen 07/29/21.  Patient notes that she is doing well.  Since last visit notes persistent hip pain and knee pain but that her SOB is the same.  Has occasional chest flutters that no do not change with nitroglycerin.  Patient's index angina was heart burn.  No change in her breathing.  Tried the nitro for her heart flutters without any changes.   Past Medical History:  Diagnosis Date   Arthritis    OA RIGHT KNEE WITH PAIN   Barrett esophagus    Bradycardia 06/01/2015   CAD in native artery    a. NSTEMI 05/2015 s/p emergent CABG.   Chronic diastolic (congestive) heart failure (HCC)    Chronic kidney disease, stage 3a (HCC)    Chronic respiratory failure (HCC)    Chronic respiratory failure with hypoxia and hypercapnia (HCC) 02/04/2010   Followed in Pulmonary clinic/ [facility]       - 02 dependent  since 07/02/10 >>  83% RA December 05, 2010       - ONO RA  08/05/12  :  Positive sat < 89 x 2:95m> repeat on 2lpm rec 08/12/2012  - 06/17/2013 reported desat with activity p Knee surgery > rec restart 2lpm with activity  - 06/27/2013   Walked 2lpm  x one lap @ 185 stopped due to sat 88% not sob , desat to 82% on RA just at th   COPD III spirometry if use FEV1/VC p saba  07/18/2010   Quit smoking May 2006       - PFT's  04/12/10 FEV1  1.21 (69%) ratio 77 and no change p B2,  DLC0 56%   VC 70%         - PFTs  08/08/2013 FEV1 1.21 (60%) ratio 86 and no change p B2 DLCO 79%  VC 72%  On symbicort 160 2bid  - PFT's  02/08/2018  FEV1 0.70 (40 % ) ratio 56 if use FEV1/VC  p 38 % improvement from saba p symb 160 prior to study with DLCO  78 % corrects to 147  % for alv volume   - 02/08/2018   Cough variant asthma 02/26/2011   Followed [facility] clinic[facility]  - PFT's  06/04/15  FEV1 1.20 (67 % ) ratio 83  p 6 % improvement  from saba with DLCO  80 % corrects to 132 % for alv volume      - Clinical dx based on response to symbicort       FENO 09/16/2016  =   96 on symbicort 160 2bid > added singulair  Allergy profile 09/16/2016 >  Eos 0.5 /  IgE  78 neg RAST  -  Referred to rehab 04/29/2017 > completed   Essential hypertension 04/20/2007   Qualifier: Diagnosis of  By: Amber Ditty, RN, Katy Fitch    GERD (gastroesophageal reflux disease)    History of ARDS 2006   History of home oxygen therapy    AT NIGHT WHEN SLEEPING 2 L / MIN NASAL CANNULA   Hyperlipidemia 07/12/2015   Hypothyroidism    Intracranial hemorrhage (HCC) 2019   a. small intracranial hemorrhage in setting of HTN.   Mild carotid artery disease (HCC)    a. Duplex 1-39% bilaterally in 2016.   Morbid (severe) obesity due to excess calories (HCC) 04/22/2015   pfts with erv 14% 06/04/15  And 33% 02/08/2018    NSTEMI (non-ST elevated myocardial infarction) (HCC) 05/31/2015   Pneumococcal pneumonia (HCC) 2006   HOSPITALIZED AND DEVELOPED ARDS   Psoriatic arthritis (HCC)    PULMONARY FIBROSIS ILD POST INFLAMMATORY  CHRONIC 07/18/2010   Followed as Primary Care Patient/ [facility]  -s/p ARDS 2006 with bacteremic S  Pna       - CT chest 07/03/10 Nonspecific PF mostly upper lobes       - CT chest 12/03/10 acute gg changes and effusions c/w chf - PFT's  02/08/2018  FVC 0.64 (28 %)   with DLCO  78 % corrects to 147 % for alv volume      Rheumatic disease    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   SOB (shortness of breath) on exertion     Past Surgical History:  Procedure Laterality Date   ABDOMINOPLASTY     CARDIAC CATHETERIZATION N/A 06/01/2015   Procedure: Left Heart Cath and Coronary Angiography;  Surgeon: Runell Gess, MD;  Location: [facility] INVASIVE CV LAB;  Service: Cardiovascular;  Laterality: N/A;   CARDIOVERSION N/A 02/12/2021   Procedure: CARDIOVERSION;  Surgeon: Meriam Sprague, MD;  Location: [facility] ENDOSCOPY;  Service: Cardiovascular;  Laterality: N/A;   CARPAL TUNNEL RELEASE     CHOLECYSTECTOMY     CORONARY ARTERY BYPASS GRAFT N/A 06/04/2015   Procedure: CORONARY ARTERY BYPASS GRAFT times three            with left internal mammary artery and right leg saphenous vein;  Surgeon: Alleen Borne, MD;  Location: [facility] OR;  Service: Open Heart Surgery;  Laterality: N/A;   cosmetic breast surgery     JOINT REPLACEMENT     KNEE ARTHROSCOPY Left    TEE WITHOUT CARDIOVERSION  06/04/2015   Procedure: TRANSESOPHAGEAL ECHOCARDIOGRAM (TEE);  Surgeon: Alleen Borne, MD;  Location: [facility] OR;  Service: Open Heart Surgery;;   TEE WITHOUT CARDIOVERSION N/A 02/12/2021   Procedure: TRANSESOPHAGEAL ECHOCARDIOGRAM (TEE);  Surgeon: Meriam Sprague, MD;  Location: [facility] ENDOSCOPY;  Service: Cardiovascular;  Laterality: N/A;   TOTAL KNEE ARTHROPLASTY Left    TOTAL KNEE ARTHROPLASTY Right 06/06/2013   Procedure: RIGHT TOTAL KNEE ARTHROPLASTY;  Surgeon: Loanne Drilling, MD;  Location: [facility] ORS;  Service: Orthopedics;  Laterality: Right;    Current Medications: Current Meds  Medication Sig   acetaminophen (TYLENOL) 650 MG CR tablet  Take 650 mg by mouth in the morning  and at bedtime. And as needed for mild pain/headache   albuterol (VENTOLIN HFA) 108 (90 Base) MCG/ACT inhaler Inhale 2 puffs into the lungs every 4 (four) hours as needed (shortness of breath, if you can't catch your breath).   apixaban (ELIQUIS) 5 MG TABS tablet Take 1 tablet (5 mg total) by mouth 2 (two) times daily.   atorvastatin (LIPITOR) 40 MG tablet Take 40 mg by mouth daily.   budesonide (PULMICORT) 0.25 MG/2ML nebulizer solution One twice daily   CALCIUM PO Take 1 tablet by mouth daily.   Cholecalciferol (VITAMIN D3) 2000 UNITS TABS Take 2,000 Int'l Units by mouth daily.   clotrimazole-betamethasone (LOTRISONE) cream Apply 1 application topically 2 (two) times daily as needed.   diltiazem (CARDIZEM CD) 240 MG 24 hr capsule Take 1 capsule (240 mg total) by mouth daily.   dofetilide (TIKOSYN) 250 MCG capsule Take 1 capsule (250 mcg total) by mouth 2 (two) times daily.   doxycycline (VIBRA-TABS) 100 MG tablet Take 1 tablet (100 mg total) by mouth 2 (two) times daily.   DULoxetine 40 MG CPEP Take 40 mg by mouth daily.   ferrous sulfate 325 (65 FE) MG tablet Take 1 tablet (325 mg total) by mouth daily with breakfast.   formoterol (PERFOROMIST) 20 MCG/2ML nebulizer solution Take 2 mLs (20 mcg total) by nebulization 2 (two) times daily. Use in nebulizer twice daily perfectly regularly   golimumab (SIMPONI ARIA) 50 MG/4ML SOLN injection Inject 50 mg into the vein every 8 (eight) weeks.    ketoconazole (NIZORAL) 2 % cream Apply 1 application topically daily as needed for irritation.   leflunomide (ARAVA) 20 MG tablet Take 1 tablet (20 mg total) by mouth daily.   levothyroxine (SYNTHROID) 125 MCG tablet TAKE 1 TABLET BY MOUTH EVERY DAY   losartan (COZAAR) 50 MG tablet Take 1 tablet (50 mg total) by mouth daily.   Magnesium Oxide 400 MG CAPS Take 1 capsule (400 mg total) by mouth daily.   montelukast (SINGULAIR) 10 MG tablet Take 10 mg by mouth daily.    nitroGLYCERIN (NITROSTAT) 0.4 MG SL tablet Place 1 tablet (0.4 mg total) under the tongue every 5 (five) minutes as needed for chest pain.   nystatin (MYCOSTATIN/NYSTOP) powder APPLY 1 APPLICATION        TOPICALLY 3 TIMES A DAY   OXYGEN Inhale 2 L into the lungs continuous. continuous o2   pantoprazole (PROTONIX) 40 MG tablet Take 1 tablet (40 mg total) by mouth daily before breakfast.   predniSONE (DELTASONE) 20 MG tablet 1 tablet daily for 7 days   pregabalin (LYRICA) 50 MG capsule Take 50 mg by mouth at bedtime.   torsemide (DEMADEX) 20 MG tablet Take 1 tablet (20 mg total) by mouth 2 (two) times daily.   traMADol (ULTRAM) 50 MG tablet Take 50 mg by mouth as needed.   triamcinolone cream (KENALOG) 0.1 % Apply 1 application topically 2 (two) times daily as needed (for psoriasis).    umeclidinium bromide (INCRUSE ELLIPTA) 62.5 MCG/INH AEPB Inhale 1 puff into the lungs daily.   vitamin B-12 (CYANOCOBALAMIN) 1000 MCG tablet Take 1,000 mcg by mouth daily.     Allergies:   Gabapentin   Social History   Socioeconomic History   Marital status: Widowed    Spouse name: Not on file   Number of children: Not on file   Years of education: Not on file   Highest education level: Not on file  Occupational History   Occupation: retired  Employer: [employer]  Tobacco Use   Smoking status: Former    Packs/day: 1.50    Years: 58.50    Pack years: 87.75    Types: Cigarettes    Quit date: 03/03/2005    Years since quitting: 16.4   Smokeless tobacco: Never  Vaping Use   Vaping Use: Never used  Substance and Sexual Activity   Alcohol use: No   Drug use: No   Sexual activity: Not on file  Other Topics Concern   Not on file  Social History Narrative   Not on file   Social Determinants of Health   Financial Resource Strain: Medium Risk   Difficulty of Paying Living Expenses: Somewhat hard  Food Insecurity: Not on file  Transportation Needs: Not on file  Physical Activity: Not on file   Stress: Not on file  Social Connections: Not on file    Social:  I Huff her and her daughter frequently; saw son inpatient, she has a former husband cared for by Dr. Delton Huff; today is anniversar of her 2nd husbands death (6 years- 08/21/15)  Family History: The patient's family history includes Breast cancer in her mother; Coronary artery disease in her father; Rheum arthritis in her father.  ROS:   Please Huff the history of present illness.     All other systems reviewed and are negative.  EKGs/Labs/Other Studies Reviewed:    The following studies were reviewed today:  EKG:   August 20, 2021: SR rate 79 iRBBB 1st HB Qtc 490 06/06/21:  SR rate 91 Baseline artifact Qtc 460 05/24/21: SR 1st HB rate 78 QTc 460 01/07/21: SR rate 74 RAE  Cardiac Event Monitoring: Date:12/24/20 Results: Patient had a minimum heart rate of 65 bpm, maximum heart rate of 214 bpm, and average heart rate of 104 bpm. Predominant underlying rhythm was sinus rhythm with 1st heart block. One run of non-sustained ventricular tachycardia occurred lasting 6 beats at longest with a max rate of 214 bpm at fastest. Isolated PACs were occasional (3.6%), with rare couplets and triplets present. Isolated PVCs were rare (<1.0%), with rare couplets and trigeminy present. Atrial Tachycardia vs Atrial Flutter occurred (37% burden), ranging from 65-198 bpm (avg of 133 bpm), the longest lasting 2 days 16 hours with an avg rate of 135 bpm. Present at activation and de-activation of device. Triggered and diary events associated with sinus rhythm, atrial tachycardia, atrial flutter, and PVCs.   Likely both atrial tachycardia and atrial flutter is present.   Transthoracic Echocardiogram: Date: 12/27/20 Results: 1. Left ventricular ejection fraction, by estimation, is 60 to 65%. The  left ventricle has normal function. The left ventricle has no regional  wall motion abnormalities. There is severe left ventricular hypertrophy.  Left  ventricular diastolic parameters   are indeterminate.   2. Right ventricular systolic function is normal. The right ventricular  size is normal.   3. Left atrial size was moderately dilated.   4. The mitral valve is normal in structure. No evidence of mitral valve  regurgitation. No evidence of mitral stenosis. Moderate mitral annular  calcification.   5. The aortic valve is normal in structure. Aortic valve regurgitation is  not visualized. No aortic stenosis is present.   6. The inferior vena cava is dilated in size with <50% respiratory  variability, suggesting right atrial pressure of 15 mmHg.   Left/Right Heart Catheterizations: Date:06/01/2015 Results: Prox LAD to Mid LAD lesion, 75% stenosed. Prox Cx lesion, 95% stenosed. Prox RCA to Mid RCA lesion, 90% stenosed.  The left ventricular systolic function is normal.    Recent Labs: 12/28/2020: TSH 0.752 06/13/2021: NT-Pro BNP 588 06/18/2021: B Natriuretic Peptide 100.8 06/19/2021: ALT 13; Magnesium 2.2; Platelets 190 07/01/2021: BUN 33; Creatinine, Ser 1.53; Hemoglobin 9.6; Potassium 4.4; Sodium 142  Recent Lipid Panel    Component Value Date/Time   CHOL 164 01/18/2020 1110   CHOL 117 07/12/2015 1508   TRIG 117 01/18/2020 1110   TRIG 75 07/12/2015 1508   HDL 56 01/18/2020 1110   HDL 51 07/12/2015 1508   CHOLHDL 2.9 01/18/2020 1110   CHOLHDL 2.3 08/25/2018 2019   VLDL 22 08/25/2018 2019   LDLCALC 87 01/18/2020 1110   LDLCALC 51 07/12/2015 1508   LDLDIRECT 131.1 08/10/2012 0829    Risk Assessment/Calculations:    CHA2DS2-VASc Score = 7  This indicates a 11.2% annual risk of stroke. The patient's score is based upon: CHF History: 1 HTN History: 1 Diabetes History: 0 Stroke History: 2 Vascular Disease History: 1 Age Score: 1 Gender Score: 1      Physical Exam:    VS:  BP 140/68   Pulse 82   Ht 4\' 11"  (1.499 m)   Wt 200 lb 6.4 oz (90.9 kg)   SpO2 95%   BMI 40.48 kg/m     Wt Readings from Last 3  Encounters:  07/29/21 200 lb 6.4 oz (90.9 kg)  07/25/21 203 lb 8 oz (92.3 kg)  07/01/21 198 lb (89.8 kg)    GEN: Obese female well developed in no acute distress HEENT: Frank's Signs NECK: No JVD LYMPHATICS: No lymphadenopathy CARDIAC: RRR, no murmurs, rubs, gallops RESPIRATORY:  Good air movement with inspiratory and expiratory wheezes ABDOMEN: Soft, non-tender, non-distended MUSCULOSKELETAL:  non-pitting edema; No deformity  SKIN: Warm and dry NEUROLOGIC:  Alert and oriented x 3, resting tremor both arms and legs PSYCHIATRIC:  Normal affect   ASSESSMENT:    1. Coronary artery disease involving native coronary artery of native heart without angina pectoris   2. Chronic obstructive pulmonary disease, unspecified COPD type (HCC)   3. Paroxysmal atrial fibrillation (HCC)   4. Essential hypertension   5. DNR (do not resuscitate)   6. PULMONARY FIBROSIS ILD POST INFLAMMATORY CHRONIC   7. S/P CABG x 3     PLAN:     Cardiac Preoperative Risk Assessment - The Revised Cardiac Risk Index = 2  which equates to moderate 2=6.6%: moderate risk - Minimal fMETS secondary to her knee and COPD =- has hx of CAD with significant COPD and PAF; would not be able to do exercise stress modalities because of debility; would not able to do dobutamine stress testing in the setting of AF, would not be able to do adenosine based testing unless her COPD improves, given native disease, CKD, and prior CABG Cardiac CT is suboptimal modality, and given CKD in an asymptomatic patient has concerns about proceeding to [facility] solely for hip preoperative eval  - will get echocardiogram to assess for changes in LVEF - this is the best compensated [name] has been from a cardiac perspective - if surgery is planned the least invasive approach would be best, her COPD may also increase her overall surgical risk - patient would like to be DNR/DNI for any orthopedic surgery - can come off eliquis for  surgery  COPD Heart Failure Preserved Ejection Fraction  Morbid Obesity HTN CKD IIIb - NYHA class II, Stage III, eurvolemic, etiology multifactorial - Diuretic regimen: Torsemide 20 mg BID - much of her SOB  in 2022 has been pulmonary mediated  Coronary Artery Disease; Obstructive s/p CABG - asymptomatic - anatomy: CABGx3 (LIMA-LAD, SVG-OM1, SVG-RCA) ~ 2016 - on Eliquis and no ASA - continue statin, goal LDL < 70 ; will recheck at next visit if not done with PCP - NO BB b/c of COPD - no Ranexa for Qtc (asymptomatic through her visits here) - has PRN nitro  Paroxysmal Atrial Flutter - CHADSVASC=7. - Continue anticoagulation with Eliquis - Continue rate control with diltiazem 240 mg  - Rhythm control options limited by pulmonary disease - Seeing Dr. Ladona Ridgel -> on Tikosyn, Qtc < 500 ms  Time Spent Directly with Patient:   I have spent a total of 40 minutes with the patient reviewing notes, imaging, EKGs, labs and examining the patient as well as establishing an assessment and plan that was discussed personally with the patient.  > 50% of time was spent in direct patient care and family discussing cardiac risks of surgery.   Will plan for three to four months follow up unless new symptoms or abnormal test results warranting change in plan    Medication Adjustments/Labs and Tests Ordered: Current medicines are reviewed at length with the patient today.  Concerns regarding medicines are outlined above.  Orders Placed This Encounter  Procedures   EKG 12-Lead   ECHOCARDIOGRAM COMPLETE      No orders of the defined types were placed in this encounter.     Patient Instructions  Medication Instructions:  Your physician recommends that you continue on your current medications as directed. Please refer to the Current Medication list given to you today.   *If you need a refill on your cardiac medications before your next appointment, please call your pharmacy*   Lab  Work: NONE If you have labs (blood work) drawn today and your tests are completely normal, you will receive your results only by: MyChart Message (if you have MyChart) OR A paper copy in the mail If you have any lab test that is abnormal or we need to change your treatment, we will call you to review the results.   Testing/Procedures: Your physician has requested that you have an echocardiogram. Echocardiography is a painless test that uses sound waves to create images of your heart. It provides your doctor with information about the size and shape of your heart and how well your heart's chambers and valves are working. This procedure takes approximately one hour. There are no restrictions for this procedure.    Follow-Up: At [facility], you and your health needs are our priority.  As part of our continuing mission to provide you with exceptional heart care, we have created designated Provider Care Teams.  These Care Teams include your primary Cardiologist (physician) and Advanced Practice Providers (APPs -  Physician Assistants and Nurse Practitioners) who all work together to provide you with the care you need, when you need it.   Your next appointment:   Jan. 27, 2023  The format for your next appointment:   In Person  Provider:   You may Huff Christell Constant, MD or one of the following Advanced Practice Providers on your designated Care Team:   Ronie Spies, PA-C Jacolyn Reedy, PA-C     Signed, Christell Constant, MD  07/29/2021 11:17 AM    [facility]

## 2021-07-30 ENCOUNTER — Ambulatory Visit (INDEPENDENT_AMBULATORY_CARE_PROVIDER_SITE_OTHER): Payer: Medicare Other | Admitting: Pharmacist

## 2021-07-30 DIAGNOSIS — I4819 Other persistent atrial fibrillation: Secondary | ICD-10-CM

## 2021-07-30 DIAGNOSIS — J449 Chronic obstructive pulmonary disease, unspecified: Secondary | ICD-10-CM

## 2021-07-30 NOTE — Progress Notes (Signed)
Chronic Care Management Pharmacy Note  08/05/2021 Name:  [name] MRN:  [id number] DOB:  1946-02-15  Summary: Pt is feeling better overall but notes mood has not changed much   Recommendations/Changes made from today's visit: -Recommended for patient to restart checking BP at home regularly and weighing herself daily -Resend PAP for Eliquis and Incruse ellipta as patient never received them   Plan: Follow up on PAP for Eliquis and Incruse ellipta BP assessment in 1 month  Subjective: [name] is an 75 y.o. year old femaleemale who is a primary patient of Isaac Bliss, Rayford Halsted, MD.  The CCM team was consulted for assistance with disease management and care coordination needs.    Engaged with patient by telephone for follow up visit in response to provider referral for pharmacy case management and/or care coordination services.   Consent to Services:  The patient was given information about Chronic Care Management services, agreed to services, and gave verbal consent prior to initiation of services.  Please see initial visit note for detailed documentation.   Patient Care Team: Isaac Bliss, Rayford Halsted, MD as PCP - General (Internal Medicine) Evans Lance, MD as PCP - Electrophysiology (Cardiology) Werner Lean, MD as PCP - Cardiology (Cardiology) Viona Gilmore, [facility] as Pharmacist (Pharmacist)  Recent office visits: 06/27/21 Domingo Mend, MD: Patient presented for hospital follow up. Increased Cymbalta to 40 mg daily.  06/05/21 Domingo Mend, MD: Patient presented for edema. Increased torsemide to 40 mg x 7 days.  05/29/21 Betty Martinique, MD: Patient presented for urinary frequency and rash. Prescribed Keflex BID x 7 days and nystatin and lotrisone cream.  03/13/21 Telephone encounter: Patient's daughter requested a dose increase for Cymbalta. Increased to 30 mg daily.  02/19/21 Domingo Mend, MD: Patient presented for post  cardioversion and medication management. Switched Celexa to Cymbalta due to QT prolongation risk.  Recent consult visits: 07/29/21 Rudean Haskell, MD (cardiology): Patient presented for CAD follow up. Follow up in 3-4 months.  07/16/21 Leigh Aurora (rheumatology): Unable to access notes.  07/01/21 Rudean Haskell, MD (cardiology): Patient presented for HF follow up. Recommended magnesium oxide. Follow up in 3 weeks.  06/24/21 Christinia Gully, MD (pulmonary): Patient presented for COPD follow up.  06/06/21 Rudean Haskell, MD (cardiology): Patient presented for HF follow up.   06/03/21 Barrington Ellison, PA-C (cardiology): Patient presented for Afib follow up.  05/24/21 Rudean Haskell, MD (cardiology): Patient presented for Afib follow up.  03/01/21 Christinia Gully, MD (pulmonary): Patient presented for COPD follow up.  02/12/21 Cardiology telephone encounter: Patient switched from Celexa to Cymbalta due to QTc prolonging risk.  02/05/21 Clint Fenton, PA (Afib clinic): Patient presented for A flutter follow up. Increased toresmide to 56m a day for 3 days then reduce back to 218ma day. Scheduled cardioversion April 12th.  01/03/21 Clint Fenton, PA (Afib clinic): Patient presented for A flutter follow up. Continued diltiazem 240 mg daily and Eliquis.  01/01/21 MiChristinia GullyMD (pulmonary): Patient presented for COPD follow up. Referral placed for sleep study.   12/25/20 Clint Fenton, PA (Afib clinic): Patient presented for initial A flutter evaluation. Prescribed Eliquis 5 mg BID and increased diltiazem to 240 mg in AM and 120 mg in PM.  Hospital visits: [facility]15-8/19/22 [facility]  [facility]   03/18/21 Patient admitted for Tikosyn start.  02/19/21 Patient admitted for  cardioversion.   Objective:  Lab Results  Component Value  Date   CREATININE 1.53 (H) 07/01/2021   BUN 33 (H) 07/01/2021   GFR 38.25 (L) 11/22/2020   GFRNONAA 41 (L) 06/20/2021   GFRAA 56 (L) 06/08/2020   NA 142 07/01/2021   K 4.4 07/01/2021   CALCIUM 9.4 07/01/2021   CO2 32 (H) 07/01/2021   GLUCOSE 109 (H) 07/01/2021    Lab Results  Component Value Date/Time   HGBA1C 5.3 08/25/2018 08:19 PM   HGBA1C 5.8 (H) 05/31/2015 09:29 PM   GFR 38.25 (L) 11/22/2020 12:18 PM   GFR 45.51 (L) 11/24/2018 01:59 PM    Last diabetic Eye exam: No results found for: HMDIABEYEEXA  Last diabetic Foot exam: No results found for: HMDIABFOOTEX   Lab Results  Component Value Date   CHOL 164 01/18/2020   HDL 56 01/18/2020   LDLCALC 87 01/18/2020   LDLDIRECT 131.1 08/10/2012   TRIG 117 01/18/2020   CHOLHDL 2.9 01/18/2020    Hepatic Function Latest Ref Rng & Units 06/19/2021 04/22/2021 12/30/2020  Total Protein 6.5 - 8.1 g/dL 6.0(L) 5.5(L) -  Albumin 3.5 - 5.0 g/dL 3.0(L) 2.9(L) 3.4(L)  AST 15 - 41 U/L 20 15 -  ALT 0 - 44 U/L 13 13 -  Alk Phosphatase 38 - 126 U/L 68 50 -  Total Bilirubin 0.3 - 1.2 mg/dL 0.6 0.7 -  Bilirubin, Direct 0.0 - 0.2 mg/dL - <0.1 -    Lab Results  Component Value Date/Time   TSH 0.752 12/28/2020 03:27 AM   TSH 1.77 11/22/2020 12:18 PM   TSH 0.61 04/23/2020 02:23 PM   FREET4 1.07 01/18/2020 11:10 AM   FREET4 1.06 03/11/2019 07:37 AM    CBC Latest Ref Rng & Units 07/01/2021 06/19/2021 06/18/2021  WBC - 10.0 5.7 -  Hemoglobin 12.0 - 16.0 9.6(A) 9.1(L) 9.5(L)  Hematocrit 36 - 46 32(A) 31.0(L) 28.0(L)  Platelets 150 - 400 K/uL - 190 -    Lab Results  Component Value Date/Time   VD25OH 49.11 02/19/2021 04:19 PM    Clinical ASCVD: Yes  The ASCVD Risk score (Arnett DK, et al., 2019) failed to calculate for the following reasons:   The patient has a prior MI or stroke diagnosis    Depression screen [facility] 2/9 02/19/2021 11/29/2020 03/23/2020  Decreased Interest 0 0 0  Down, Depressed, Hopeless 0 1 0  PHQ - 2 Score 0 1 0   Altered sleeping 0 0 -  Tired, decreased energy 2 3 -  Change in appetite 0 1 -  Feeling bad or failure about yourself  0 0 -  Trouble concentrating 0 0 -  Moving slowly or fidgety/restless 0 0 -  Suicidal thoughts 0 0 -  PHQ-9 Score 2 5 -  Difficult doing work/chores Not difficult at all - -  Some recent data might be hidden     CHA2DS2/VAS Stroke Risk Points  Current as of 12 minutes ago     5 >= 2 Points: High Risk  1 - 1.99 Points: Medium Risk  0 Points: Low Risk    No Change      Details    This score determines the patient's risk of having a stroke if the  patient has atrial fibrillation.       Points Metrics  1 Has Congestive Heart Failure:  Yes    Current as of 12 minutes ago  1 Has Vascular Disease:  Yes    Current as of 12 minutes  ago  1 Has Hypertension:  Yes    Current as of 12 minutes ago  1 Age:  60    Current as of 12 minutes ago  0 Has Diabetes:  No    Current as of 12 minutes ago  0 Had Stroke:  No  Had TIA:  No  Had Thromboembolism:  No    Current as of 12 minutes ago  1 Female:  Yes    Current as of 12 minutes ago       Social History   Tobacco Use  Smoking Status Former   Packs/day: 1.50   Years: 58.50   Pack years: 87.75   Types: Cigarettes   Quit date: 03/03/2005   Years since quitting: 16.4  Smokeless Tobacco Never   BP Readings from Last 3 Encounters:  07/29/21 140/68  07/25/21 124/74  07/01/21 112/68   Pulse Readings from Last 3 Encounters:  07/29/21 82  07/25/21 91  07/01/21 82   Wt Readings from Last 3 Encounters:  07/29/21 200 lb 6.4 oz (90.9 kg)  07/25/21 203 lb 8 oz (92.3 kg)  07/01/21 198 lb (89.8 kg)   BMI Readings from Last 3 Encounters:  07/29/21 40.48 kg/m  07/25/21 41.10 kg/m  07/01/21 39.99 kg/m    Assessment/Interventions: Review of patient past medical history, allergies, medications, health status, including review of consultants reports, laboratory and other test data, was performed as part of  comprehensive evaluation and provision of chronic care management services.   SDOH:  (Social Determinants of Health) assessments and interventions performed: Yes   SDOH Screenings   Alcohol Screen: Not on file  Depression (PHQ2-9): Low Risk    PHQ-2 Score: 2  Financial Resource Strain: Medium Risk   Difficulty of Paying Living Expenses: Somewhat hard  Food Insecurity: Not on file  Housing: Not on file  Physical Activity: Not on file  Social Connections: Not on file  Stress: Not on file  Tobacco Use: Medium Risk   Smoking Tobacco Use: Former   Smokeless Tobacco Use: Never  Transportation Needs: Not on file    [place]  Allergies  Allergen Reactions   Gabapentin Other (See Comments)    Dizziness, lighthead    Medications Reviewed Today     Reviewed by Viona Gilmore, [facility] (Pharmacist) on 07/30/21 at 1218  Med List Status: <None>   Medication Order Taking? Sig Documenting Provider Last Dose Status Informant  acetaminophen (TYLENOL) 650 MG CR tablet [id number]  Take 650 mg by mouth in the morning and at bedtime. And as needed for mild pain/headache [provider]  Active Care Giver  albuterol (VENTOLIN HFA) 108 (90 Base) MCG/ACT inhaler [id number] Yes Inhale 2 puffs into the lungs every 4 (four) hours as needed (shortness of breath, if you can't catch your breath). Tanda Rockers, MD Taking Active   apixaban (ELIQUIS) 5 MG TABS tablet [id number] Yes Take 1 tablet (5 mg total) by mouth 2 (two) times daily. Fenton, Clint R, PA Taking Active Care Giver  atorvastatin (LIPITOR) 40 MG tablet [id number]  Take 40 mg by mouth daily. [provider]  Active   budesonide (PULMICORT) 0.25 MG/2ML nebulizer solution [id number] Yes One twice daily Tanda Rockers, MD Taking Active Care Giver  CALCIUM PO [id number]  Take 1 tablet by mouth daily. [provider]  Active Care Giver  Cholecalciferol (VITAMIN D3) 2000 UNITS TABS [id number]  Take 2,000 Int'l Units by  mouth daily. [provider]  [place]  clotrimazole-betamethasone (LOTRISONE) cream [id number]  Apply 1 application topically 2 (two) times daily as needed. Martinique, Betty G, MD  Active   diltiazem (CARDIZEM CD) 240 MG 24 hr capsule [id number]  Take 1 capsule (240 mg total) by mouth daily. Werner Lean, MD  Active Care Giver  dofetilide [facility]) 250 MCG capsule [id number]  Take 1 capsule (250 mcg total) by mouth 2 (two) times daily. Fenton, Ludlow R, PA  Active Care Giver  doxycycline (VIBRA-TABS) 100 MG tablet [id number] Yes Take 1 tablet (100 mg total) by mouth 2 (two) times daily. Laurin Coder, MD Taking Active   DULoxetine 40 MG CPEP [id number]  Take 40 mg by mouth daily. Isaac Bliss, Rayford Halsted, MD  Active   ferrous sulfate 325 (65 FE) MG tablet [id number]  Take 1 tablet (325 mg total) by mouth daily with breakfast. Werner Lean, MD  Active Care Giver  formoterol (PERFOROMIST) 20 MCG/2ML nebulizer solution [id number] Yes Take 2 mLs (20 mcg total) by nebulization 2 (two) times daily. Use in nebulizer twice daily perfectly regularly Tanda Rockers, MD Taking Active Care Giver  golimumab [facility] ARIA) 50 MG/4ML SOLN injection [id number]  Inject 50 mg into the vein every 8 (eight) weeks.  [provider]  Active Care Giver           Med Note Constance Haw, Rutherford Guys Jul 01, 2021 10:07 AM)    ketoconazole (NIZORAL) 2 % cream [id number]  Apply 1 application topically daily as needed for irritation. [provider]  Active Care Giver  leflunomide (ARAVA) 20 MG tablet [id number]  Take 1 tablet (20 mg total) by mouth daily. Isaac Bliss, Rayford Halsted, MD  Active Care Giver  levothyroxine (SYNTHROID) 125 MCG tablet [id number]  TAKE 1 TABLET BY MOUTH EVERY DAY Isaac Bliss, Rayford Halsted, MD  Active   losartan (COZAAR) 50 MG tablet [id number] Yes Take 1 tablet (50 mg total) by mouth daily. Werner Lean, MD Taking Active Care Giver   Magnesium Oxide 400 MG CAPS [id number]  Take 1 capsule (400 mg total) by mouth daily. Werner Lean, MD  Active Care Giver  montelukast (SINGULAIR) 10 MG tablet [id number]  Take 10 mg by mouth daily. [provider]  Active Care Giver  nitroGLYCERIN (NITROSTAT) 0.4 MG SL tablet [id number]  Place 1 tablet (0.4 mg total) under the tongue every 5 (five) minutes as needed for chest pain. Werner Lean, MD  Active Care Giver  nystatin (MYCOSTATIN/NYSTOP) powder [id number]  APPLY 1 APPLICATION        TOPICALLY 3 TIMES A DAY Isaac Bliss, Rayford Halsted, MD  Active   OXYGEN [id number]  Inhale 2 L into the lungs continuous. continuous o2 [provider]  Active Care Giver  pantoprazole (PROTONIX) 40 MG tablet [id number]  Take 1 tablet (40 mg total) by mouth daily before breakfast. Tanda Rockers, MD  Active Care Giver  predniSONE (DELTASONE) 20 MG tablet [id number] Yes 1 tablet daily for 7 days Laurin Coder, MD Taking Active   pregabalin (LYRICA) 50 MG capsule [id number]  Take 50 mg by mouth at bedtime. [provider]  Active Care Giver  torsemide (DEMADEX) 20 MG tablet [id number] Yes Take 1 tablet (20 mg total) by mouth 2 (two) times daily. Mariel Aloe, MD Taking Active   traMADol Veatrice Bourbon) 50 MG tablet [id number]  Take 50 mg by mouth as needed. [provider]  Active   triamcinolone cream (KENALOG) 0.1 % [id number]  Apply  1 application topically 2 (two) times daily as needed (for psoriasis).  [provider]  Active Care Giver  umeclidinium bromide (INCRUSE ELLIPTA) 62.5 MCG/INH AEPB [id number] No Inhale 1 puff into the lungs daily.  Patient not taking: Reported on 07/30/2021   Tanda Rockers, MD Not Taking Active Care Giver           Med Note Constance Haw, Tommy Rainwater   Fri May 24, 2021  9:57 AM)    vitamin B-12 (CYANOCOBALAMIN) 1000 MCG tablet [id number]  Take 1,000 mcg by mouth daily. [provider]  Active Care Giver             Patient Active Problem List   Diagnosis Date Noted   Acute on chronic respiratory failure with hypoxia and hypercapnia ([place]) 06/18/2021   Leukocytosis 06/18/2021   DNR (do not resuscitate) 06/18/2021   Persistent atrial fibrillation ([place]) 02/05/2021   Chronic heart failure with preserved ejection fraction ([place]) 01/07/2021   Stage 3b chronic kidney disease ([place]) 01/07/2021   Paroxysmal atrial fibrillation ([place]) 01/03/2021   Sciatica 12/27/2020   Diarrhea 12/27/2020   Atrial flutter ([place]) 12/25/2020   Secondary hypercoagulable state ([place]) 12/25/2020   Chronic respiratory failure with hypoxia and hypercapnia ([place]) 04/20/2019   Severe episode of recurrent major depressive disorder, without psychotic features ([place]) 11/09/2018   COPD (chronic obstructive pulmonary disease) ([place]) 11/02/2018   COPD with acute exacerbation ([place]) 11/01/2018   CAP (community acquired pneumonia) 10/28/2018   ICH (intracerebral hemorrhage) ([place]) 08/25/2018   Hyperlipidemia 07/12/2015   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   Morbid (severe) obesity due to excess calories ([place]) 04/22/2015   Postoperative anemia due to acute blood loss 06/08/2013   History of home oxygen therapy 06/07/2013   GERD (gastroesophageal reflux disease) 06/07/2013   Barrett's esophagus 06/07/2013   OA (osteoarthritis) of knee 06/06/2013   COPD III spirometry if use FEV1/VC p saba  07/18/2010   PULMONARY FIBROSIS ILD POST INFLAMMATORY CHRONIC 07/18/2010   Compression fracture of thoracic vertebra ([place]) 07/02/2010   Hypothyroidism 04/20/2007   Essential hypertension 04/20/2007   PSORIATIC ARTHRITIS 04/20/2007    Immunization History  Administered Date(s) Administered   Influenza Split 08/04/2011, 09/01/2012, 07/05/2015   Influenza Whole 09/21/2007, 07/23/2010, 10/03/2016   Influenza, High Dose Seasonal PF 07/27/2015, 12/24/2017, 08/09/2018, 08/29/2019   Influenza-Unspecified 10/03/2013, 09/03/2014   PFIZER(Purple Top)SARS-COV-2 Vaccination  09/03/2020, 10/03/2020   Pneumococcal Conjugate-13 04/12/2014   Pneumococcal Polysaccharide-23 07/27/2015   Pneumococcal-Unspecified 11/03/2004   Tdap 08/12/2011   Patient reports that her Cymbalta dose increase hasnt been as helpful and feels she is still quick to get emotional. Discussed how it could take up to 8 weeks to see the full benefit.  Patient reports her fluid is a lot better with the torsemide. She has not been weighing herself daily at home but she will start to do so.  Conditions to be addressed/monitored:  Hypertension, Hyperlipidemia, Heart Failure, GERD, COPD, Hypothyroidism, Depression and Pain, Arthritis  Conditions addressed this visit: Hypertension, COPD  Care Plan : CCM Pharmacy Care Plan  Updates made by Viona Gilmore, [place] since 08/05/2021 12:00 AM     Problem: Problem: Hypertension, Hyperlipidemia, Heart Failure, GERD, COPD, Hypothyroidism, Depression and Pain, Arthritis      Long-Range Goal: Patient-Specific Goal   Start Date: 03/28/2021  Expected End Date: 03/28/2022  Recent Progress: On track  Priority: High  Note:   Current Barriers:  Unable to independently monitor therapeutic efficacy  Pharmacist Clinical Goal(s):  Patient will verbalize ability  to afford treatment regimen achieve adherence to monitoring guidelines and medication adherence to achieve therapeutic efficacy through collaboration with PharmD and provider.   Interventions: 1:1 collaboration with Isaac Bliss, Rayford Halsted, MD regarding development and update of comprehensive plan of care as evidenced by provider attestation and co-signature Inter-disciplinary care team collaboration (see longitudinal plan of care) Comprehensive medication review performed; medication list updated in electronic medical record  Hypertension (BP goal <140/90) -Uncontrolled -Current treatment: Losartan 50mg , 1 tablet once daily  Diltiazem (Cardizem CD) 120mg , 1 capsule at bedtime  -Medications  previously tried: amlodipine -Current home readings: not checking at home -Current dietary habits: did not discuss -Current exercise habits: limited -Denies hypotensive/hypertensive symptoms -Educated on Exercise goal of 150 minutes per week; Importance of home blood pressure monitoring; Proper BP monitoring technique; -Counseled to monitor BP at home weekly, document, and provide log at future appointments -Counseled on diet and exercise extensively Recommended to continue current medication  Hyperlipidemia: (LDL goal < 70) -Uncontrolled -Current treatment: Atorvastatin 40mg , 1 tablet once daily at 6 pm -Medications previously tried: none  -Current dietary patterns: did not discuss -Current exercise habits: did not discuss -Educated on Cholesterol goals;  Benefits of statin for ASCVD risk reduction; Importance of limiting foods high in cholesterol; Exercise goal of 150 minutes per week; -Counseled on diet and exercise extensively Recommended to continue current medication Recommended repeat lipid panel and increasing statin dose if LDL not < 70  CAD (Goal: prevent heart events) -Controlled -Current treatment  Atorvastatin 40mg , 1 tablet once daily -Medications previously tried: aspirin -Recommended to continue current medication  Atrial Fibrillation (Goal: prevent stroke and major bleeding) -Controlled -CHADSVASC: 5 -Current treatment: Rhythm control: Dofetilide 250 mcg 1 capsule twice daily Anticoagulation: Eliquis 5 mg 1 tablet twice daily -Medications previously tried: none -Home BP and HR readings: refer to above readings  -Counseled on increased risk of stroke due to Afib and benefits of anticoagulation for stroke prevention; importance of adherence to anticoagulant exactly as prescribed; avoidance of NSAIDs due to increased bleeding risk with anticoagulants; -Recommended to continue current medication Assessed patient finances. Patient would likely qualify for  PAP. Plan for PAP assessment with CPA.   Heart Failure (Goal: manage symptoms and prevent exacerbations) -Controlled -Last ejection fraction: 60-65% (Date: 12/27/2020) -HF type: Diastolic -NYHA Class: II (slight limitation of activity) -AHA HF Stage: C (Heart disease and symptoms present) -Current treatment: Torsemide 20mg , 1 tablet twice daily Losartan 50mg ,1 tablet once daily  -Medications previously tried: furosemide  -Current home BP/HR readings: refer to above readings -Current dietary habits: did not discuss -Current exercise habits: did not discuss -Educated on Importance of weighing daily; if you gain more than 3 pounds in one day or 5 pounds in one week, call cardiologist Proper diuretic administration and potassium supplementation Importance of blood pressure control -Recommended to continue current medication  COPD (Goal: control symptoms and prevent exacerbations) -Controlled -Current treatment  Albuterol (Ventolin HFA) 165mcg/ act inhaler, inhale 2 puffs every four hours as needed for shortness of breath Montelukast 10mg , 1 tablet at bedtime  Budesonide 0.25 mg/79mL, inhale twice daily Formoterol 20 mcg/mL, inhale 2 mLs twice daily  Incruse Ellipta 62.5 mcg 1 puff daily -Medications previously tried: Trelegy -Gold Grade: Gold 3 (FEV1 30-49%) -Current COPD Classification:  B (high sx, <2 exacerbations/yr) -MMRC/CAT score: n/a -Pulmonary function testing: n/a -Exacerbations requiring treatment in last 6 months: none -Patient reports consistent use of maintenance inhaler -Frequency of rescue inhaler use: once a month -Counseled on Proper inhaler technique;  Benefits of consistent maintenance inhaler use When to use rescue inhaler Differences between maintenance and rescue inhalers -Recommended to continue current medication Assessed patient finances. Patient would likely qualify for PAP for Incruse Ellipa. Plan for [place] outreach.  Depression(Goal: minimize  symptoms) -Controlled -Current treatment: Duloxetine 40 mg, 1 tablet in the morning  -Medications previously tried/failed: citalopram (Qtc prolongation risk) -PHQ9: 2 -Educated on Benefits of medication for symptom control Benefits of cognitive-behavioral therapy with or without medication -Recommended to continue current medication  Psoriatic arthritis (Goal: minimize symptoms) -Controlled -Current treatment  golimumab (Simponi Aria), inject 50m every eight weeks  leflunomide (Arava) 260m 1 tablet once daily  -Medications previously tried: none  -Recommended to continue current medication  Hypothyroidism (Goal: 0.35-4.5) -Controlled -Current treatment  Levothyroxine 12537m 1 tablet once daily  -Medications previously tried: none  -Recommended to continue current medication  GERD/Barrett's esophagus (Goal: minimize symptoms) -Controlled -Current treatment  Pantoprazole 18m96m tablet once daily before breakfast  -Medications previously tried: omeprazole  -Recommended to continue current medication  Pain (Goal: minimize symptoms of pain) -Controlled -Current treatment  Tramadol 50mg41mtablet twice daily  APAP 650mg,24m00mg t76m daily  Cyclobenzaprine 5mg, 1 55mlet at bedtime as needed for muscle spasm -Medications previously tried: n/a  -Recommended to continue current medication  Osteopenia (Goal prevent fractures) -Controlled -Last DEXA Scan: 08/17/2018             T-Score femoral neck: -1.9             T-Score lumbar spine: 0.8             T-Score femur: -1.7             10-year probability of major osteoporotic fracture: 10.3%             10-year probability of hip fracture: 1.9% -Patient is not a candidate for pharmacologic treatment -Current treatment  Calcium, 1 tablet once daily  Vitamin D3 (cholecalciferol) (25mcg) 271munits, 1 tablet once daily -Medications previously tried: none  -Recommend [phone] units of vitamin D daily. Recommend 1200 mg of  calcium daily from dietary and supplemental sources. Recommend weight-bearing and muscle strengthening exercises for building and maintaining bone density. -Counseled on diet and exercise extensively Recommended to continue current medication Recommended repeat DEXA scan   Health Maintenance -Vaccine gaps: shingles -Current therapy:  Triamcinolone cream 1%, apply twice daily as needed for psoriasis -Educated on Cost vs benefit of each product must be carefully weighed by individual consumer -Patient is satisfied with current therapy and denies issues -Recommended to continue current medication  Patient Goals/Self-Care Activities Patient will:  - take medications as prescribed check blood pressure weekly, document, and provide at future appointments target a minimum of 150 minutes of moderate intensity exercise weekly  Follow Up Plan: Telephone follow up appointment with care management team member scheduled for: 4 months        Compliance/Adherence/Medication fill history: Care Gaps: Influenza vaccine, COVID booster, influenza, Hep C screening   Star-Rating Drugs: Losartan (Cozaar) 50 mg - Last filled 07-20-2021 90 DS at [facility] Atorvastatin (Lipitor) 40 mg - Last [facility]  Medication Assistance: Application for Incruse Ellipta and Eliquis   medication assistance program. in process.  Anticipated assistance start date 05/02/21.  See plan of care for additional detail.  Compliance/Adherence/Medication fill history: Care Gaps: None  Star-Rating Drugs: Atorvastatin 40 mg - last filled [facility]/14/21 [facility] Losartan 50 mg - last filled 03/31/21 for  [facility]  Patient's preferred pharmacy is:  [facility], [place] to [place] [address] Phone: [phone] Fax: [phone]  [facility]  ([place], [facility] [address] [place] [place] [address] Phone: [phone] Fax: [phone]  [id number]: [phone]: [phone] Uses pill box? Yes Pt endorses 99% compliance  We discussed: Current pharmacy is preferred with insurance plan and patient is satisfied with pharmacy services Patient decided to: Continue current medication management strategy  Care Plan and Follow Up Patient Decision:  Patient agrees to Care Plan and Follow-up.  Plan: Telephone follow up appointment with care management team member scheduled for:  4 months  MJeni Salles PharmD BLake WilsonPharmacist LM[phone]

## 2021-07-30 NOTE — Telephone Encounter (Signed)
Pt seen by MW on 06/24/21 and by AO on 07/25/21. Will close encounter.

## 2021-07-31 ENCOUNTER — Telehealth: Payer: Self-pay | Admitting: Internal Medicine

## 2021-07-31 DIAGNOSIS — J9611 Chronic respiratory failure with hypoxia: Secondary | ICD-10-CM | POA: Diagnosis not present

## 2021-07-31 DIAGNOSIS — I130 Hypertensive heart and chronic kidney disease with heart failure and stage 1 through stage 4 chronic kidney disease, or unspecified chronic kidney disease: Secondary | ICD-10-CM | POA: Diagnosis not present

## 2021-07-31 DIAGNOSIS — N1832 Chronic kidney disease, stage 3b: Secondary | ICD-10-CM | POA: Diagnosis not present

## 2021-07-31 DIAGNOSIS — J449 Chronic obstructive pulmonary disease, unspecified: Secondary | ICD-10-CM | POA: Diagnosis not present

## 2021-07-31 DIAGNOSIS — J9612 Chronic respiratory failure with hypercapnia: Secondary | ICD-10-CM | POA: Diagnosis not present

## 2021-07-31 DIAGNOSIS — I5033 Acute on chronic diastolic (congestive) heart failure: Secondary | ICD-10-CM | POA: Diagnosis not present

## 2021-07-31 NOTE — Telephone Encounter (Signed)
PT daughter called to advise that her mom is doing no better and wants to have her be seen by Dr.hernandez but Dr.hernandez has nothing available in office till next week. She would like to see if the PTs Dr can work her in some how and would like Fleet Contras to call her back.

## 2021-07-31 NOTE — Telephone Encounter (Signed)
Appointment scheduled and daughter is aware.

## 2021-08-01 ENCOUNTER — Other Ambulatory Visit: Payer: Self-pay

## 2021-08-01 DIAGNOSIS — J449 Chronic obstructive pulmonary disease, unspecified: Secondary | ICD-10-CM | POA: Diagnosis not present

## 2021-08-01 DIAGNOSIS — I130 Hypertensive heart and chronic kidney disease with heart failure and stage 1 through stage 4 chronic kidney disease, or unspecified chronic kidney disease: Secondary | ICD-10-CM | POA: Diagnosis not present

## 2021-08-01 DIAGNOSIS — I5033 Acute on chronic diastolic (congestive) heart failure: Secondary | ICD-10-CM | POA: Diagnosis not present

## 2021-08-01 DIAGNOSIS — J9611 Chronic respiratory failure with hypoxia: Secondary | ICD-10-CM | POA: Diagnosis not present

## 2021-08-01 DIAGNOSIS — N1832 Chronic kidney disease, stage 3b: Secondary | ICD-10-CM | POA: Diagnosis not present

## 2021-08-01 DIAGNOSIS — J9612 Chronic respiratory failure with hypercapnia: Secondary | ICD-10-CM | POA: Diagnosis not present

## 2021-08-02 ENCOUNTER — Ambulatory Visit: Payer: Medicare Other | Admitting: Internal Medicine

## 2021-08-02 DIAGNOSIS — J449 Chronic obstructive pulmonary disease, unspecified: Secondary | ICD-10-CM

## 2021-08-02 DIAGNOSIS — I4819 Other persistent atrial fibrillation: Secondary | ICD-10-CM

## 2021-08-05 DIAGNOSIS — N1832 Chronic kidney disease, stage 3b: Secondary | ICD-10-CM | POA: Diagnosis not present

## 2021-08-05 DIAGNOSIS — J449 Chronic obstructive pulmonary disease, unspecified: Secondary | ICD-10-CM | POA: Diagnosis not present

## 2021-08-05 DIAGNOSIS — I130 Hypertensive heart and chronic kidney disease with heart failure and stage 1 through stage 4 chronic kidney disease, or unspecified chronic kidney disease: Secondary | ICD-10-CM | POA: Diagnosis not present

## 2021-08-05 DIAGNOSIS — J9611 Chronic respiratory failure with hypoxia: Secondary | ICD-10-CM | POA: Diagnosis not present

## 2021-08-05 DIAGNOSIS — J9612 Chronic respiratory failure with hypercapnia: Secondary | ICD-10-CM | POA: Diagnosis not present

## 2021-08-05 DIAGNOSIS — I5033 Acute on chronic diastolic (congestive) heart failure: Secondary | ICD-10-CM | POA: Diagnosis not present

## 2021-08-05 NOTE — Patient Instructions (Signed)
Hi [name],  It was great catching up with you again! Below is a summary of some of the topics we discussed.   Please reach out to me if you have any questions or need anything before our follow up!  Best, Maddie  Gaylord Shih, PharmD, [facility] Clinical Pharmacist [facility] [phone]   Visit Information   Goals Addressed   None    Patient Care Plan: CCM Pharmacy Care Plan     Problem Identified: Problem: Hypertension, Hyperlipidemia, Heart Failure, GERD, COPD, Hypothyroidism, Depression and Pain, Arthritis      Long-Range Goal: Patient-Specific Goal   Start Date: 03/28/2021  Expected End Date: 03/28/2022  Recent Progress: On track  Priority: High  Note:   Current Barriers:  Unable to independently monitor therapeutic efficacy  Pharmacist Clinical Goal(s):  Patient will verbalize ability to afford treatment regimen achieve adherence to monitoring guidelines and medication adherence to achieve therapeutic efficacy through collaboration with PharmD and provider.   Interventions: 1:1 collaboration with [name], [name], MD regarding development and update of comprehensive plan of care as evidenced by provider attestation and co-signature Inter-disciplinary care team collaboration (see longitudinal plan of care) Comprehensive medication review performed; medication list updated in electronic medical record  Hypertension (BP goal <140/90) -Uncontrolled -Current treatment: Losartan 50mg , 1 tablet once daily  Diltiazem (Cardizem CD) 120mg , 1 capsule at bedtime  -Medications previously tried: amlodipine -Current home readings: not checking at home -Current dietary habits: did not discuss -Current exercise habits: limited -Denies hypotensive/hypertensive symptoms -Educated on Exercise goal of 150 minutes per week; Importance of home blood pressure monitoring; Proper BP monitoring technique; -Counseled to monitor BP at home weekly,  document, and provide log at future appointments -Counseled on diet and exercise extensively Recommended to continue current medication  Hyperlipidemia: (LDL goal < 70) -Uncontrolled -Current treatment: Atorvastatin 40mg , 1 tablet once daily at 6 pm -Medications previously tried: none  -Current dietary patterns: did not discuss -Current exercise habits: did not discuss -Educated on Cholesterol goals;  Benefits of statin for ASCVD risk reduction; Importance of limiting foods high in cholesterol; Exercise goal of 150 minutes per week; -Counseled on diet and exercise extensively Recommended to continue current medication Recommended repeat lipid panel and increasing statin dose if LDL not < 70  CAD (Goal: prevent heart events) -Controlled -Current treatment  Atorvastatin 40mg , 1 tablet once daily -Medications previously tried: aspirin -Recommended to continue current medication  Atrial Fibrillation (Goal: prevent stroke and major bleeding) -Controlled -CHADSVASC: 5 -Current treatment: Rhythm control: Dofetilide 250 mcg 1 capsule twice daily Anticoagulation: Eliquis 5 mg 1 tablet twice daily -Medications previously tried: none -Home BP and HR readings: refer to above readings  -Counseled on increased risk of stroke due to Afib and benefits of anticoagulation for stroke prevention; importance of adherence to anticoagulant exactly as prescribed; avoidance of NSAIDs due to increased bleeding risk with anticoagulants; -Recommended to continue current medication Assessed patient finances. Patient would likely qualify for PAP. Plan for PAP assessment with CPA.   Heart Failure (Goal: manage symptoms and prevent exacerbations) -Controlled -Last ejection fraction: 60-65% (Date: 12/27/2020) -HF type: Diastolic -NYHA Class: II (slight limitation of activity) -AHA HF Stage: C (Heart disease and symptoms present) -Current treatment: Torsemide 20mg , 1 tablet twice daily Losartan 50mg ,1  tablet once daily  -Medications previously tried: furosemide  -Current home BP/HR readings: refer to above readings -Current dietary habits: did not discuss -Current exercise habits: did not discuss -Educated on Importance of weighing daily; if you gain  more than 3 pounds in one day or 5 pounds in one week, call cardiologist Proper diuretic administration and potassium supplementation Importance of blood pressure control -Recommended to continue current medication  COPD (Goal: control symptoms and prevent exacerbations) -Controlled -Current treatment  Albuterol (Ventolin HFA) 132mcg/ act inhaler, inhale 2 puffs every four hours as needed for shortness of breath Montelukast 10mg , 1 tablet at bedtime  Budesonide 0.25 mg/63mL, inhale twice daily Formoterol 20 mcg/mL, inhale 2 mLs twice daily  Incruse Ellipta 62.5 mcg 1 puff daily -Medications previously tried: Trelegy -Gold Grade: Gold 3 (FEV1 30-49%) -Current COPD Classification:  B (high sx, <2 exacerbations/yr) -MMRC/CAT score: n/a -Pulmonary function testing: n/a -Exacerbations requiring treatment in last 6 months: none -Patient reports consistent use of maintenance inhaler -Frequency of rescue inhaler use: once a month -Counseled on Proper inhaler technique; Benefits of consistent maintenance inhaler use When to use rescue inhaler Differences between maintenance and rescue inhalers -Recommended to continue current medication Assessed patient finances. Patient would likely qualify for PAP for Incruse Ellipa. Plan for CMA outreach.  Depression(Goal: minimize symptoms) -Controlled -Current treatment: Duloxetine 40 mg, 1 tablet in the morning  -Medications previously tried/failed: citalopram (Qtc prolongation risk) -PHQ9: 2 -Educated on Benefits of medication for symptom control Benefits of cognitive-behavioral therapy with or without medication -Recommended to continue current medication  Psoriatic arthritis (Goal: minimize  symptoms) -Controlled -Current treatment  golimumab (Simponi Aria), inject 50mg  every eight weeks  leflunomide (Arava) 20mg , 1 tablet once daily  -Medications previously tried: none  -Recommended to continue current medication  Hypothyroidism (Goal: 0.35-4.5) -Controlled -Current treatment  Levothyroxine 3m, 1 tablet once daily  -Medications previously tried: none  -Recommended to continue current medication  GERD/Barrett's esophagus (Goal: minimize symptoms) -Controlled -Current treatment  Pantoprazole 40mg , 1 tablet once daily before breakfast  -Medications previously tried: omeprazole  -Recommended to continue current medication  Pain (Goal: minimize symptoms of pain) -Controlled -Current treatment  Tramadol 50mg , 1 tablet twice daily  APAP 650mg , 1,300mg  twice daily  Cyclobenzaprine 5mg , 1 tablet at bedtime as needed for muscle spasm -Medications previously tried: n/a  -Recommended to continue current medication  Osteopenia (Goal prevent fractures) -Controlled -Last DEXA Scan: 08/17/2018             T-Score femoral neck: -1.9             T-Score lumbar spine: 0.8             T-Score femur: -1.7             10-year probability of major osteoporotic fracture: 10.3%             10-year probability of hip fracture: 1.9% -Patient is not a candidate for pharmacologic treatment -Current treatment  Calcium, 1 tablet once daily  Vitamin D3 (cholecalciferol) ( ) 2000 units, 1 tablet once daily -Medications previously tried: none  -Recommend [phone] units of vitamin D daily. Recommend 1200 mg of calcium daily from dietary and supplemental sources. Recommend weight-bearing and muscle strengthening exercises for building and maintaining bone density. -Counseled on diet and exercise extensively Recommended to continue current medication Recommended repeat DEXA scan   Health Maintenance -Vaccine gaps: shingles -Current therapy:  Triamcinolone cream 1%, apply twice  daily as needed for psoriasis -Educated on Cost vs benefit of each product must be carefully weighed by individual consumer -Patient is satisfied with current therapy and denies issues -Recommended to continue current medication  Patient Goals/Self-Care Activities Patient will:  - take medications as prescribed check blood  pressure weekly, document, and provide at future appointments target a minimum of 150 minutes of moderate intensity exercise weekly  Follow Up Plan: Telephone follow up appointment with care management team member scheduled for: 4 months        Patient verbalizes understanding of instructions provided today and agrees to view in MyChart.  Telephone follow up appointment with pharmacy team member scheduled for: 4 months  Verner Chol, [facility]

## 2021-08-06 ENCOUNTER — Telehealth: Payer: Self-pay | Admitting: Pharmacist

## 2021-08-06 ENCOUNTER — Encounter: Payer: Self-pay | Admitting: Internal Medicine

## 2021-08-06 ENCOUNTER — Ambulatory Visit (INDEPENDENT_AMBULATORY_CARE_PROVIDER_SITE_OTHER): Payer: Medicare Other | Admitting: Internal Medicine

## 2021-08-06 ENCOUNTER — Other Ambulatory Visit: Payer: Self-pay

## 2021-08-06 VITALS — BP 120/70 | HR 77 | Temp 97.9°F | Wt 202.9 lb

## 2021-08-06 DIAGNOSIS — J9621 Acute and chronic respiratory failure with hypoxia: Secondary | ICD-10-CM | POA: Diagnosis not present

## 2021-08-06 DIAGNOSIS — J9622 Acute and chronic respiratory failure with hypercapnia: Secondary | ICD-10-CM | POA: Diagnosis not present

## 2021-08-06 DIAGNOSIS — Z23 Encounter for immunization: Secondary | ICD-10-CM

## 2021-08-06 DIAGNOSIS — J441 Chronic obstructive pulmonary disease with (acute) exacerbation: Secondary | ICD-10-CM | POA: Diagnosis not present

## 2021-08-06 DIAGNOSIS — I2510 Atherosclerotic heart disease of native coronary artery without angina pectoris: Secondary | ICD-10-CM | POA: Diagnosis not present

## 2021-08-06 NOTE — Progress Notes (Signed)
Established Patient Office Visit     This visit occurred during the SARS-CoV-2 public health emergency.  Safety protocols were in place, including screening questions prior to the visit, additional usage of staff PPE, and extensive cleaning of exam room while observing appropriate contact time as indicated for disinfecting solutions.    CC/Reason for Visit: Follow-up chronic conditions  HPI: [name] is a 75 y.o. female who is coming in today for the above mentioned reasons. Past Medical History is significant for: She was hospitalized in August for acute on chronic respiratory failure both hypoxemic and hypercarbic and was discharged on BiPAP therapy.  About 10 days ago she had an increase in productive cough, saw her pulmonologist who put her on doxycycline and prednisone and she is much improved.  She is requesting flu vaccine today, she would also like information about the new COVID booster.  She tells me she has a tentative date of December 7 for her hip repair.   Past Medical/Surgical History: Past Medical History:  Diagnosis Date   Arthritis    OA RIGHT KNEE WITH PAIN   Barrett esophagus    Bradycardia 06/01/2015   CAD in native artery    a. NSTEMI 05/2015 s/p emergent CABG.   Chronic diastolic (congestive) heart failure (HCC)    Chronic kidney disease, stage 3a (HCC)    Chronic respiratory failure (HCC)    Chronic respiratory failure with hypoxia and hypercapnia (HCC) 02/04/2010   Followed in Pulmonary clinic/ [facility]       - 02 dependent  since 07/02/10 >>  83% RA December 05, 2010       - ONO RA 08/05/12  :  Positive sat < 89 x 2:51m> repeat on 2lpm rec 08/12/2012  - 06/17/2013 reported desat with activity p Knee surgery > rec restart 2lpm with activity  - 06/27/2013   Walked 2lpm  x one lap @ 185 stopped due to sat 88% not sob , desat to 82% on RA just at th   COPD III spirometry if use FEV1/VC p saba  07/18/2010   Quit smoking May 2006       -  PFT's  04/12/10 FEV1  1.21 (69%) ratio 77 and no change p B2,  DLC0 56%   VC 70%         - PFTs  08/08/2013 FEV1 1.21 (60%) ratio 86 and no change p B2 DLCO 79%  VC 72%  On symbicort 160 2bid  - PFT's  02/08/2018  FEV1 0.70 (40 % ) ratio 56 if use FEV1/VC  p 38 % improvement from saba p symb 160 prior to study with DLCO  78 % corrects to 147  % for alv volume   - 02/08/2018   Cough variant asthma 02/26/2011   Followed in Pulmonary clinic/ [facility]  - PFT's  06/04/15  FEV1 1.20 (67 % ) ratio 83  p 6 % improvement from saba with DLCO  80 % corrects to 132 % for alv volume      - Clinical dx based on response to symbicort       FENO 09/16/2016  =   96 on symbicort 160 2bid > added singulair  Allergy profile 09/16/2016 >  Eos 0.5 /  IgE  78 neg RAST  -  Referred to rehab 04/29/2017 > completed   Essential hypertension 04/20/2007   Qualifier: Diagnosis of  By: Marcelyn Ditty RN, Katy Fitch    GERD (gastroesophageal  reflux disease)    History of ARDS 2006   History of home oxygen therapy    AT NIGHT WHEN SLEEPING 2 L / MIN NASAL CANNULA   Hyperlipidemia 07/12/2015   Hypothyroidism    Intracranial hemorrhage (HCC) 2019   a. small intracranial hemorrhage in setting of HTN.   Mild carotid artery disease (HCC)    a. Duplex 1-39% bilaterally in 2016.   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   Followed as Primary Care Patient/ [facility]  -s/p ARDS 2006 with bacteremic S  Pna       - CT chest 07/03/10 Nonspecific PF mostly upper lobes       - CT chest 12/03/10 acute gg changes and effusions c/w chf - PFT's  02/08/2018  FVC 0.64 (28 %)   with DLCO  78 % corrects to 147 % for alv volume      Rheumatic disease     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   SOB (shortness of breath) on exertion     Past Surgical History:  Procedure Laterality Date   ABDOMINOPLASTY     CARDIAC CATHETERIZATION N/A 06/01/2015   Procedure: Left Heart Cath and Coronary Angiography;  Surgeon: Runell Gess, MD;  Location: [facility] INVASIVE CV LAB;  Service: Cardiovascular;  Laterality: N/A;   CARDIOVERSION N/A 02/12/2021   Procedure: CARDIOVERSION;  Surgeon: Meriam Sprague, MD;  Location: [facility] ENDOSCOPY;  Service: Cardiovascular;  Laterality: N/A;   CARPAL TUNNEL RELEASE     CHOLECYSTECTOMY     CORONARY ARTERY BYPASS GRAFT N/A 06/04/2015   Procedure: CORONARY ARTERY BYPASS GRAFT times three            with left internal mammary artery and right leg saphenous vein;  Surgeon: Alleen Borne, MD;  Location: [facility] OR;  Service: Open Heart Surgery;  Laterality: N/A;   cosmetic breast surgery     JOINT REPLACEMENT     KNEE ARTHROSCOPY Left    TEE WITHOUT CARDIOVERSION  06/04/2015   Procedure: TRANSESOPHAGEAL ECHOCARDIOGRAM (TEE);  Surgeon: Alleen Borne, MD;  Location: [facility] OR;  Service: Open Heart Surgery;;   TEE WITHOUT CARDIOVERSION N/A 02/12/2021   Procedure: TRANSESOPHAGEAL ECHOCARDIOGRAM (TEE);  Surgeon: Meriam Sprague, MD;  Location: [facility] ENDOSCOPY;  Service: Cardiovascular;  Laterality: N/A;   TOTAL KNEE ARTHROPLASTY Left    TOTAL KNEE ARTHROPLASTY Right 06/06/2013   Procedure: RIGHT TOTAL KNEE ARTHROPLASTY;  Surgeon: Loanne Drilling, MD;  Location: [facility] ORS;  Service: Orthopedics;  Laterality: Right;    Social History:  reports that she quit smoking about 16 years ago. Her smoking use included cigarettes. She has a 87.75 pack-year smoking history. She has never used smokeless tobacco. She reports that she does not drink alcohol and does not use drugs.  Allergies: Allergies  Allergen Reactions   Gabapentin Other (See Comments)    Dizziness, lighthead    Family History:  Family History  Problem Relation Age of Onset   Breast cancer  Mother    Coronary artery disease Father    Rheum arthritis Father      Current Outpatient Medications:    acetaminophen (TYLENOL) 650 MG CR tablet, Take 650 mg by mouth in  the morning and at bedtime. And as needed for mild pain/headache, Disp: , Rfl:    albuterol (VENTOLIN HFA) 108 (90 Base) MCG/ACT inhaler, Inhale 2 puffs into the lungs every 4 (four) hours as needed (shortness of breath, if you can't catch your breath)., Disp: 8 g, Rfl: 5   apixaban (ELIQUIS) 5 MG TABS tablet, Take 1 tablet (5 mg total) by mouth 2 (two) times daily., Disp: 56 tablet, Rfl: 0   atorvastatin (LIPITOR) 40 MG tablet, Take 40 mg by mouth daily., Disp: , Rfl:    budesonide (PULMICORT) 0.25 MG/2ML nebulizer solution, One twice daily, Disp: 120 mL, Rfl: 12   CALCIUM PO, Take 1 tablet by mouth daily., Disp: , Rfl:    Cholecalciferol (VITAMIN D3) 2000 UNITS TABS, Take 2,000 Int'l Units by mouth daily., Disp: , Rfl:    clotrimazole-betamethasone (LOTRISONE) cream, Apply 1 application topically 2 (two) times daily as needed., Disp: 45 g, Rfl: 0   diltiazem (CARDIZEM CD) 240 MG 24 hr capsule, Take 1 capsule (240 mg total) by mouth daily., Disp: 90 capsule, Rfl: 1   dofetilide (TIKOSYN) 250 MCG capsule, Take 1 capsule (250 mcg total) by mouth 2 (two) times daily., Disp: 60 capsule, Rfl: 3   doxycycline (VIBRA-TABS) 100 MG tablet, Take 1 tablet (100 mg total) by mouth 2 (two) times daily., Disp: 20 tablet, Rfl: 0   DULoxetine 40 MG CPEP, Take 40 mg by mouth daily., Disp: 90 capsule, Rfl: 1   ferrous sulfate 325 (65 FE) MG tablet, Take 1 tablet (325 mg total) by mouth daily with breakfast., Disp: 30 tablet, Rfl: 0   formoterol (PERFOROMIST) 20 MCG/2ML nebulizer solution, Take 2 mLs (20 mcg total) by nebulization 2 (two) times daily. Use in nebulizer twice daily perfectly regularly, Disp: 120 mL, Rfl: 11   golimumab (SIMPONI ARIA) 50 MG/4ML SOLN injection, Inject 50 mg into the vein every 8 (eight) weeks. , Disp: , Rfl:     ketoconazole (NIZORAL) 2 % cream, Apply 1 application topically daily as needed for irritation., Disp: , Rfl:    leflunomide (ARAVA) 20 MG tablet, Take 1 tablet (20 mg total) by mouth daily., Disp: , Rfl:    levothyroxine (SYNTHROID) 125 MCG tablet, TAKE 1 TABLET BY MOUTH EVERY DAY, Disp: 90 tablet, Rfl: 1   losartan (COZAAR) 50 MG tablet, Take 1 tablet (50 mg total) by mouth daily., Disp: 90 tablet, Rfl: 3   Magnesium Oxide 400 MG CAPS, Take 1 capsule (400 mg total) by mouth daily., Disp: 90 capsule, Rfl: 3   montelukast (SINGULAIR) 10 MG tablet, Take 10 mg by mouth daily., Disp: , Rfl:    nitroGLYCERIN (NITROSTAT) 0.4 MG SL tablet, Place 1 tablet (0.4 mg total) under the tongue every 5 (five) minutes as needed for chest pain., Disp: 30 tablet, Rfl: 3   nystatin (MYCOSTATIN/NYSTOP) powder, APPLY 1 APPLICATION        TOPICALLY 3 TIMES A DAY, Disp: 15 g, Rfl: 0   OXYGEN, Inhale 2 L into the lungs continuous. continuous o2, Disp: , Rfl:    pantoprazole (PROTONIX) 40 MG tablet, Take 1 tablet (40 mg total) by mouth daily before breakfast., Disp: 90 tablet, Rfl: 3   pregabalin (LYRICA) 50 MG capsule, Take 50 mg by mouth at bedtime., Disp: , Rfl:    torsemide (DEMADEX) 20 MG tablet, Take 1 tablet (20 mg total) by mouth 2 (two) times daily., Disp: , Rfl:    traMADol (ULTRAM) 50 MG tablet, Take 50 mg by mouth as  needed., Disp: , Rfl:    triamcinolone cream (KENALOG) 0.1 %, Apply 1 application topically 2 (two) times daily as needed (for psoriasis). , Disp: , Rfl:    umeclidinium bromide (INCRUSE ELLIPTA) 62.5 MCG/INH AEPB, Inhale 1 puff into the lungs daily., Disp: 3 each, Rfl: 2   vitamin B-12 (CYANOCOBALAMIN) 1000 MCG tablet, Take 1,000 mcg by mouth daily., Disp: , Rfl:   Review of Systems:  Constitutional: Denies fever, chills, diaphoresis, appetite change and fatigue.  HEENT: Denies photophobia, eye pain, redness, hearing loss, ear pain, congestion, sore throat, rhinorrhea, sneezing, mouth sores,  trouble swallowing, neck pain, neck stiffness and tinnitus.   Respiratory: Denies , chest tightness,  and wheezing.   Cardiovascular: Denies chest pain, palpitations and leg swelling.  Gastrointestinal: Denies nausea, vomiting, abdominal pain, diarrhea, constipation, blood in stool and abdominal distention.  Genitourinary: Denies dysuria, urgency, frequency, hematuria, flank pain and difficulty urinating.  Endocrine: Denies: hot or cold intolerance, sweats, changes in hair or nails, polyuria, polydipsia. Musculoskeletal: Positive for myalgias, back pain, joint swelling, arthralgias and gait problem.  Skin: Denies pallor, rash and wound.  Neurological: Denies dizziness, seizures, syncope, weakness, light-headedness, numbness and headaches.  Hematological: Denies adenopathy. Easy bruising, personal or family bleeding history  Psychiatric/Behavioral: Denies suicidal ideation, mood changes, confusion, nervousness, sleep disturbance and agitation    Physical Exam: Vitals:   08/06/21 1557  BP: 120/70  Pulse: 77  Temp: 97.9 F (36.6 C)  TempSrc: Oral  SpO2: 93%  Weight: 202 lb 14.4 oz (92 kg)    Body mass index is 40.98 kg/m.   Constitutional: NAD, calm, comfortable in a wheelchair, obese Eyes: PERRL, lids and conjunctivae normal, wears corrective lenses ENMT: Mucous membranes are moist.  Respiratory: clear to auscultation bilaterally, no wheezing, no crackles. Normal respiratory effort. No accessory muscle use.  Cardiovascular: Regular rate and rhythm, no murmurs / rubs / gallops. No extremity edema.  Psychiatric: Normal judgment and insight. Alert and oriented x 3. Normal mood.    Impression and Plan:  Needs flu shot  - Plan: Flu Vaccine QUAD High Dose(Fluad)  Acute on chronic respiratory failure with hypoxia and hypercapnia (HCC)  COPD with acute exacerbation (HCC)  -Her respiratory status is improved today, she has completed her course of prednisone and doxycycline.  I  have recommended that she get the COVID booster when able.  Time spent: 33 minutes reviewing chart, interviewing patient and daughter, examining patient and formulating plan of care.     Chaya Jan, MD [facility]

## 2021-08-06 NOTE — Chronic Care Management (AMB) (Signed)
Per Gaylord Shih, filled out patient assistance applications for Eliquis and Incruse, both are to be mailed to the patient for her review and forwarded to the prescribing offices for completion. Included instructions for her portions of the applications and updated [employer]'s Patient assistance tracking spreadsheet as well. Did not call patient per Sheran Lawless as patient is aware and expecting to receive them via mail.  Pamala Duffel CMA Clinical Pharmacist Assistant [phone]

## 2021-08-07 ENCOUNTER — Ambulatory Visit (HOSPITAL_COMMUNITY): Payer: Medicare Other | Attending: Cardiovascular Disease

## 2021-08-07 DIAGNOSIS — I2510 Atherosclerotic heart disease of native coronary artery without angina pectoris: Secondary | ICD-10-CM | POA: Diagnosis not present

## 2021-08-07 DIAGNOSIS — H26491 Other secondary cataract, right eye: Secondary | ICD-10-CM | POA: Diagnosis not present

## 2021-08-07 DIAGNOSIS — H35033 Hypertensive retinopathy, bilateral: Secondary | ICD-10-CM | POA: Diagnosis not present

## 2021-08-07 DIAGNOSIS — Z961 Presence of intraocular lens: Secondary | ICD-10-CM | POA: Diagnosis not present

## 2021-08-07 DIAGNOSIS — H04123 Dry eye syndrome of bilateral lacrimal glands: Secondary | ICD-10-CM | POA: Diagnosis not present

## 2021-08-07 LAB — ECHOCARDIOGRAM COMPLETE
Area-P 1/2: 1.83 cm2
S' Lateral: 2.4 cm

## 2021-08-07 MED ORDER — PERFLUTREN LIPID MICROSPHERE
1.0000 mL | INTRAVENOUS | Status: AC | PRN
  Administered 2021-08-07: 3 mL via INTRAVENOUS

## 2021-08-08 ENCOUNTER — Telehealth: Payer: Self-pay | Admitting: Internal Medicine

## 2021-08-08 DIAGNOSIS — N1832 Chronic kidney disease, stage 3b: Secondary | ICD-10-CM | POA: Diagnosis not present

## 2021-08-08 DIAGNOSIS — J9611 Chronic respiratory failure with hypoxia: Secondary | ICD-10-CM | POA: Diagnosis not present

## 2021-08-08 DIAGNOSIS — I5033 Acute on chronic diastolic (congestive) heart failure: Secondary | ICD-10-CM | POA: Diagnosis not present

## 2021-08-08 DIAGNOSIS — I130 Hypertensive heart and chronic kidney disease with heart failure and stage 1 through stage 4 chronic kidney disease, or unspecified chronic kidney disease: Secondary | ICD-10-CM | POA: Diagnosis not present

## 2021-08-08 DIAGNOSIS — J449 Chronic obstructive pulmonary disease, unspecified: Secondary | ICD-10-CM | POA: Diagnosis not present

## 2021-08-08 DIAGNOSIS — J9612 Chronic respiratory failure with hypercapnia: Secondary | ICD-10-CM | POA: Diagnosis not present

## 2021-08-08 NOTE — Telephone Encounter (Signed)
MW were you going to do the surgical clearance for this pt?  Thanks She was last seen by AO on 07/25/21 for OSA and seen by MW on 06/24/21 for COPD>  thanks

## 2021-08-09 DIAGNOSIS — Z9049 Acquired absence of other specified parts of digestive tract: Secondary | ICD-10-CM | POA: Diagnosis not present

## 2021-08-09 DIAGNOSIS — Z79899 Other long term (current) drug therapy: Secondary | ICD-10-CM | POA: Diagnosis not present

## 2021-08-09 DIAGNOSIS — K5903 Drug induced constipation: Secondary | ICD-10-CM | POA: Diagnosis not present

## 2021-08-09 NOTE — Telephone Encounter (Signed)
Called patient's daughter but she did not answer. Will need to know where to send the clearance office notes.

## 2021-08-09 NOTE — Telephone Encounter (Signed)
I addressed this in the note 06/24/21 under a/p and cleared her for surgery but is high risk vent dep post op and she and fm know it

## 2021-08-12 DIAGNOSIS — Z1231 Encounter for screening mammogram for malignant neoplasm of breast: Secondary | ICD-10-CM | POA: Diagnosis not present

## 2021-08-12 LAB — HM MAMMOGRAPHY

## 2021-08-12 NOTE — Telephone Encounter (Signed)
Called Dr Allusio's office to get their fax number- [phone]  I faxed Dr Thurston Hole note from 06/24/21  Called and spoke with Cala Bradford and notified her that this was done  Nothing further needed

## 2021-08-14 DIAGNOSIS — J9611 Chronic respiratory failure with hypoxia: Secondary | ICD-10-CM | POA: Diagnosis not present

## 2021-08-14 DIAGNOSIS — J449 Chronic obstructive pulmonary disease, unspecified: Secondary | ICD-10-CM | POA: Diagnosis not present

## 2021-08-14 DIAGNOSIS — J9612 Chronic respiratory failure with hypercapnia: Secondary | ICD-10-CM | POA: Diagnosis not present

## 2021-08-14 DIAGNOSIS — N1832 Chronic kidney disease, stage 3b: Secondary | ICD-10-CM | POA: Diagnosis not present

## 2021-08-14 DIAGNOSIS — I130 Hypertensive heart and chronic kidney disease with heart failure and stage 1 through stage 4 chronic kidney disease, or unspecified chronic kidney disease: Secondary | ICD-10-CM | POA: Diagnosis not present

## 2021-08-14 DIAGNOSIS — I5033 Acute on chronic diastolic (congestive) heart failure: Secondary | ICD-10-CM | POA: Diagnosis not present

## 2021-08-16 DIAGNOSIS — J9612 Chronic respiratory failure with hypercapnia: Secondary | ICD-10-CM | POA: Diagnosis not present

## 2021-08-16 DIAGNOSIS — J449 Chronic obstructive pulmonary disease, unspecified: Secondary | ICD-10-CM | POA: Diagnosis not present

## 2021-08-16 DIAGNOSIS — I5033 Acute on chronic diastolic (congestive) heart failure: Secondary | ICD-10-CM | POA: Diagnosis not present

## 2021-08-16 DIAGNOSIS — J9611 Chronic respiratory failure with hypoxia: Secondary | ICD-10-CM | POA: Diagnosis not present

## 2021-08-16 DIAGNOSIS — I130 Hypertensive heart and chronic kidney disease with heart failure and stage 1 through stage 4 chronic kidney disease, or unspecified chronic kidney disease: Secondary | ICD-10-CM | POA: Diagnosis not present

## 2021-08-16 DIAGNOSIS — N1832 Chronic kidney disease, stage 3b: Secondary | ICD-10-CM | POA: Diagnosis not present

## 2021-08-20 ENCOUNTER — Other Ambulatory Visit (HOSPITAL_COMMUNITY): Payer: Self-pay | Admitting: Physician Assistant

## 2021-08-21 DIAGNOSIS — J449 Chronic obstructive pulmonary disease, unspecified: Secondary | ICD-10-CM | POA: Diagnosis not present

## 2021-08-21 DIAGNOSIS — J9611 Chronic respiratory failure with hypoxia: Secondary | ICD-10-CM | POA: Diagnosis not present

## 2021-08-21 DIAGNOSIS — J9612 Chronic respiratory failure with hypercapnia: Secondary | ICD-10-CM | POA: Diagnosis not present

## 2021-08-21 DIAGNOSIS — N1832 Chronic kidney disease, stage 3b: Secondary | ICD-10-CM | POA: Diagnosis not present

## 2021-08-21 DIAGNOSIS — I5033 Acute on chronic diastolic (congestive) heart failure: Secondary | ICD-10-CM | POA: Diagnosis not present

## 2021-08-21 DIAGNOSIS — I130 Hypertensive heart and chronic kidney disease with heart failure and stage 1 through stage 4 chronic kidney disease, or unspecified chronic kidney disease: Secondary | ICD-10-CM | POA: Diagnosis not present

## 2021-08-23 DIAGNOSIS — J449 Chronic obstructive pulmonary disease, unspecified: Secondary | ICD-10-CM | POA: Diagnosis not present

## 2021-08-23 DIAGNOSIS — I5033 Acute on chronic diastolic (congestive) heart failure: Secondary | ICD-10-CM | POA: Diagnosis not present

## 2021-08-23 DIAGNOSIS — I130 Hypertensive heart and chronic kidney disease with heart failure and stage 1 through stage 4 chronic kidney disease, or unspecified chronic kidney disease: Secondary | ICD-10-CM | POA: Diagnosis not present

## 2021-08-23 DIAGNOSIS — J9611 Chronic respiratory failure with hypoxia: Secondary | ICD-10-CM | POA: Diagnosis not present

## 2021-08-23 DIAGNOSIS — N1832 Chronic kidney disease, stage 3b: Secondary | ICD-10-CM | POA: Diagnosis not present

## 2021-08-23 DIAGNOSIS — J9612 Chronic respiratory failure with hypercapnia: Secondary | ICD-10-CM | POA: Diagnosis not present

## 2021-08-26 DIAGNOSIS — M1611 Unilateral primary osteoarthritis, right hip: Secondary | ICD-10-CM | POA: Diagnosis not present

## 2021-08-26 DIAGNOSIS — M0589 Other rheumatoid arthritis with rheumatoid factor of multiple sites: Secondary | ICD-10-CM | POA: Diagnosis not present

## 2021-08-26 DIAGNOSIS — E785 Hyperlipidemia, unspecified: Secondary | ICD-10-CM | POA: Diagnosis not present

## 2021-08-26 DIAGNOSIS — M069 Rheumatoid arthritis, unspecified: Secondary | ICD-10-CM | POA: Diagnosis not present

## 2021-08-26 DIAGNOSIS — I252 Old myocardial infarction: Secondary | ICD-10-CM | POA: Diagnosis not present

## 2021-08-26 DIAGNOSIS — M4804 Spinal stenosis, thoracic region: Secondary | ICD-10-CM | POA: Diagnosis not present

## 2021-08-26 DIAGNOSIS — M48061 Spinal stenosis, lumbar region without neurogenic claudication: Secondary | ICD-10-CM | POA: Diagnosis not present

## 2021-08-26 DIAGNOSIS — M5416 Radiculopathy, lumbar region: Secondary | ICD-10-CM | POA: Diagnosis not present

## 2021-08-26 DIAGNOSIS — I2510 Atherosclerotic heart disease of native coronary artery without angina pectoris: Secondary | ICD-10-CM | POA: Diagnosis not present

## 2021-08-26 DIAGNOSIS — L4050 Arthropathic psoriasis, unspecified: Secondary | ICD-10-CM | POA: Diagnosis not present

## 2021-08-26 DIAGNOSIS — D72829 Elevated white blood cell count, unspecified: Secondary | ICD-10-CM | POA: Diagnosis not present

## 2021-08-26 DIAGNOSIS — Z79899 Other long term (current) drug therapy: Secondary | ICD-10-CM | POA: Diagnosis not present

## 2021-08-26 DIAGNOSIS — I480 Paroxysmal atrial fibrillation: Secondary | ICD-10-CM | POA: Diagnosis not present

## 2021-08-26 DIAGNOSIS — I4892 Unspecified atrial flutter: Secondary | ICD-10-CM | POA: Diagnosis not present

## 2021-08-26 DIAGNOSIS — M5430 Sciatica, unspecified side: Secondary | ICD-10-CM | POA: Diagnosis not present

## 2021-08-26 DIAGNOSIS — J8410 Pulmonary fibrosis, unspecified: Secondary | ICD-10-CM | POA: Diagnosis not present

## 2021-08-26 DIAGNOSIS — D631 Anemia in chronic kidney disease: Secondary | ICD-10-CM | POA: Diagnosis not present

## 2021-08-26 DIAGNOSIS — N1832 Chronic kidney disease, stage 3b: Secondary | ICD-10-CM | POA: Diagnosis not present

## 2021-08-26 DIAGNOSIS — J449 Chronic obstructive pulmonary disease, unspecified: Secondary | ICD-10-CM | POA: Diagnosis not present

## 2021-08-26 DIAGNOSIS — J9612 Chronic respiratory failure with hypercapnia: Secondary | ICD-10-CM | POA: Diagnosis not present

## 2021-08-26 DIAGNOSIS — G8929 Other chronic pain: Secondary | ICD-10-CM | POA: Diagnosis not present

## 2021-08-26 DIAGNOSIS — F332 Major depressive disorder, recurrent severe without psychotic features: Secondary | ICD-10-CM | POA: Diagnosis not present

## 2021-08-26 DIAGNOSIS — R32 Unspecified urinary incontinence: Secondary | ICD-10-CM | POA: Diagnosis not present

## 2021-08-26 DIAGNOSIS — E039 Hypothyroidism, unspecified: Secondary | ICD-10-CM | POA: Diagnosis not present

## 2021-08-26 DIAGNOSIS — I130 Hypertensive heart and chronic kidney disease with heart failure and stage 1 through stage 4 chronic kidney disease, or unspecified chronic kidney disease: Secondary | ICD-10-CM | POA: Diagnosis not present

## 2021-08-26 DIAGNOSIS — J9611 Chronic respiratory failure with hypoxia: Secondary | ICD-10-CM | POA: Diagnosis not present

## 2021-08-26 DIAGNOSIS — I5033 Acute on chronic diastolic (congestive) heart failure: Secondary | ICD-10-CM | POA: Diagnosis not present

## 2021-08-27 DIAGNOSIS — J9612 Chronic respiratory failure with hypercapnia: Secondary | ICD-10-CM | POA: Diagnosis not present

## 2021-08-27 DIAGNOSIS — J9611 Chronic respiratory failure with hypoxia: Secondary | ICD-10-CM | POA: Diagnosis not present

## 2021-08-27 DIAGNOSIS — J449 Chronic obstructive pulmonary disease, unspecified: Secondary | ICD-10-CM | POA: Diagnosis not present

## 2021-08-27 DIAGNOSIS — N1832 Chronic kidney disease, stage 3b: Secondary | ICD-10-CM | POA: Diagnosis not present

## 2021-08-27 DIAGNOSIS — I5033 Acute on chronic diastolic (congestive) heart failure: Secondary | ICD-10-CM | POA: Diagnosis not present

## 2021-08-27 DIAGNOSIS — I130 Hypertensive heart and chronic kidney disease with heart failure and stage 1 through stage 4 chronic kidney disease, or unspecified chronic kidney disease: Secondary | ICD-10-CM | POA: Diagnosis not present

## 2021-08-29 ENCOUNTER — Telehealth: Payer: Self-pay | Admitting: Internal Medicine

## 2021-08-29 NOTE — Telephone Encounter (Signed)
   [facility] Pre-operative Risk Assessment    Patient Name: [name]  DOB: [birth year] MRN: [id number]  HEARTCARE STAFF:  - IMPORTANT!!!!!! Under Visit Info/Reason for Call, type in Other and utilize the format Clearance MM/DD/YY or Clearance TBD. Do not use dashes or single digits. - Please review there is not already an duplicate clearance open for this procedure. - If request is for dental extraction, please clarify the # of teeth to be extracted. - If the patient is currently at the dentist's office, call Pre-Op Callback Staff (MA/nurse) to input urgent request.  - If the patient is not currently in the dentist office, please route to the Pre-Op pool.  Request for surgical clearance:  What type of surgery is being performed? L Hip Replacement  When is this surgery scheduled? 09/16/21  What type of clearance is required (medical clearance vs. Pharmacy clearance to hold med vs. Both)? Both  Are there any medications that need to be held prior to surgery and how long? Eliquis TBD by cardiology.   Patient needs to know how long to hold Eliquis prior to surgery and when to resume post surgery   Practice name and name of physician performing surgery? Dr. Pilar Plate [name] , [facility]  What is the office phone number? [phone]   7.   What is the office fax number? [phone]  8.   Anesthesia type (None, local, MAC, general) ? General   Johnna Acosta 08/29/2021, 10:50 AM  _________________________________________________________________   (provider comments below)

## 2021-08-29 NOTE — Telephone Encounter (Signed)
   Name: [name]  DOB: 1946-02-03  MRN: [id number]   Primary Cardiologist: Christell Constant, MD  Chart reviewed as part of pre-operative protocol coverage. Patient was contacted 08/29/2021 in reference to pre-operative risk assessment for pending surgery as outlined below.  [name] was last seen on 07/29/21 by Dr. Izora Ribas.  Since that day, [name] has done fine from a cardiac standpoint. She remains quite limited in her activity due to hip pain and is unable to complete 4 METs. At her last visit with Dr. Izora Ribas, she was recommended to undergo an echocardiogram to evaluate LV function for preop assessment which showed EF 65-70%, mild LVH, G2DD, no RWMA, mild LAE, and no significant valvular abnormalities.   Given lack of symptoms and reassuring echocardiogram, the patient would be at acceptable risk for the planned procedure without further cardiovascular testing.   The patient was advised that if she develops new symptoms prior to surgery to contact our office to arrange for a follow-up visit, and she verbalized understanding.  Per pharmacy recommendations, patient can hold eliquis 3 days prior to her upcoming surgery with plans to restart therapeutic dosing 24 hr after procedure if bleeding risk is acceptable.   I will route this recommendation to the requesting party via Epic fax function and remove from pre-op pool. Please call with questions.  Beatriz Stallion, PA-C 08/29/2021, 1:21 PM

## 2021-08-29 NOTE — Telephone Encounter (Signed)
Patient with diagnosis of afib on Eliquis for anticoagulation.    Procedure: L Hip Replacement Date of procedure: 09/16/21   CHA2DS2-VASc Score = 7   This indicates a 11.2% annual risk of stroke. The patient's score is based upon: CHF History: 1 HTN History: 1 Diabetes History: 0 Stroke History: 2 Vascular Disease History: 1 Age Score: 1 Gender Score: 1     CrCl 31.9 ml/min  ACC and CHEST guidelines do not recommend parenteral bridging for periprocedural interruption of DOAC therapy.  Per office protocol, patient can hold Eliquis for 3 days prior to procedure.    She should resume Eliquis 24 post procedure at therapeutic dose as long as hemodynamically stable.

## 2021-08-30 ENCOUNTER — Encounter: Payer: Self-pay | Admitting: Internal Medicine

## 2021-09-02 NOTE — Patient Instructions (Addendum)
DUE TO COVID-19 ONLY ONE VISITOR IS ALLOWED TO COME WITH YOU AND STAY IN THE WAITING ROOM ONLY DURING PRE OP AND PROCEDURE DAY OF SURGERY.   Up to two visitors ages 16+ are allowed at one time in a patient's room.  The visitors may rotate out with other people throughout the day.  Additionally, up to two children between the ages of 97 and 5 are allowed and do not count toward the number of allowed visitors.  Children within this age range must be accompanied by an adult visitor.  One adult visitor may remain with the patient overnight and must be in the room by 8 PM.  YOU NEED TO HAVE A COVID 19 TEST ON__11-10-22_____ between 8am-3pm______, THIS TEST MUST BE DONE BEFORE SURGERY,     Please bring completed form with you to the COVID testing site     COVID TESTING SITE [place] TEST IS COMPLETED,  PLEASE Wear a mask when in public           Your procedure is scheduled on: 09-16-21   Report to [facility] Main  Entrance   Report to admitting at       1200  PM     Call this number if you have problems the morning of surgery [phone]   Remember: NO SOLID FOOD AFTER MIDNIGHT THE NIGHT PRIOR TO SURGERY.                                     NOTHING BY MOUTH EXCEPT CLEAR LIQUIDS UNTIL  1145 am .                                     PLEASE FINISH ENSURE DRINK PER SURGEON ORDER  WHICH NEEDS TO BE COMPLETED AT  1145 am the day of  your       surgery  then NOTHING BY MOUTH .     CLEAR LIQUID DIET   Foods Allowed                                                                     Foods Excluded  Coffee and tea, regular and decaf                             liquids that you cannot  Plain Jell-O any favor except red or purple                                           see through such as: Fruit ices (not with fruit pulp)                                     milk, soups, orange juice  Iced Popsicles  All solid food                                  Cranberry, grape and apple juices Sports drinks like Gatorade Lightly seasoned clear broth or consume(fat free) Sugar   _____________________________________________________________________     BRUSH YOUR TEETH MORNING OF SURGERY AND RINSE YOUR MOUTH OUT, NO CHEWING GUM CANDY OR MINTS.    Stop taking __Elequis______as directed by your Surgeon/Cardiologist.  Contact your Surgeon/Cardiologist for instructions on Anticoagulant Therapy prior to surgery.    Take these medicines the morning of surgery with A SIP OF WATER: Nebulizer, inhaler s and bring inhalers with you, pantoprazole, levothyroxine, leflunomide, Cymbalta, dofetilide(Tikosyn), Diltiazem, atorvastatin, tylenol if needed                                 You may not have any metal on your body including hair pins and              piercings  Do not wear jewelry, make-up, lotions, powders,perfumes,        deodorant             Do not wear nail polish on your fingernails or toenails .  Do not shave  48 hours prior to surgery.                 Do not bring valuables to the hospital. [facility] IS NOT             RESPONSIBLE   FOR VALUABLES.  Contacts, dentures or bridgework may not be worn into surgery.  You may bring a small overnight bag with you     Patients discharged the day of surgery will not be allowed to drive home. IF YOU ARE HAVING SURGERY AND GOING HOME THE SAME DAY, YOU MUST HAVE AN ADULT TO DRIVE YOU HOME AND BE WITH YOU FOR 24 HOURS. YOU MAY GO HOME BY TAXI OR UBER OR ORTHERWISE, BUT AN ADULT MUST ACCOMPANY YOU HOME AND STAY WITH YOU FOR 24 HOURS.  Name and phone number of your driver:  Special Instructions: N/A              Please read over the following fact sheets you were given: _____________________________________________________________________             [facility] - Preparing for Surgery Before surgery, you can play an important role.   Because skin is not sterile, your skin needs to be as free of germs as possible.  You can reduce the number of germs on your skin by washing with CHG (chlorahexidine gluconate) soap before surgery.  CHG is an antiseptic cleaner which kills germs and bonds with the skin to continue killing germs even after washing. Please DO NOT use if you have an allergy to CHG or antibacterial soaps.  If your skin becomes reddened/irritated stop using the CHG and inform your nurse when you arrive at Short Stay. Do not shave (including legs and underarms) for at least 48 hours prior to the first CHG shower.  You may shave your face/neck. Please follow these instructions carefully:  1.  Shower with CHG Soap the night before surgery and the  morning of Surgery.  2.  If you choose to wash your hair, wash your hair first as usual with your  normal  shampoo.  3.  After  you shampoo, rinse your hair and body thoroughly to remove the  shampoo.                           4.  Use CHG as you would any other liquid soap.  You can apply chg directly  to the skin and wash                       Gently with a scrungie or clean washcloth.  5.  Apply the CHG Soap to your body ONLY FROM THE NECK DOWN.   Do not use on face/ open                           Wound or open sores. Avoid contact with eyes, ears mouth and genitals (private parts).                       Wash face,  Genitals (private parts) with your normal soap.             6.  Wash thoroughly, paying special attention to the area where your surgery  will be performed.  7.  Thoroughly rinse your body with warm water from the neck down.  8.  DO NOT shower/wash with your normal soap after using and rinsing off  the CHG Soap.                9.  Pat yourself dry with a clean towel.            10.  Wear clean pajamas.            11.  Place clean sheets on your bed the night of your first shower and do not  sleep with pets. Day of Surgery : Do not apply any lotions/deodorants the  morning of surgery.  Please wear clean clothes to the hospital/surgery center.  FAILURE TO FOLLOW THESE INSTRUCTIONS MAY RESULT IN THE CANCELLATION OF YOUR SURGERY PATIENT SIGNATURE_________________________________  NURSE SIGNATURE__________________________________  ________________________________________________________________________    Adam Phenix  An incentive spirometer is a tool that can help keep your lungs clear and active. This tool measures how well you are filling your lungs with each breath. Taking long deep breaths may help reverse or decrease the chance of developing breathing (pulmonary) problems (especially infection) following: A long period of time when you are unable to move or be active. BEFORE THE PROCEDURE  If the spirometer includes an indicator to show your best effort, your nurse or respiratory therapist will set it to a desired goal. If possible, sit up straight or lean slightly forward. Try not to slouch. Hold the incentive spirometer in an upright position. INSTRUCTIONS FOR USE  Sit on the edge of your bed if possible, or sit up as far as you can in bed or on a chair. Hold the incentive spirometer in an upright position. Breathe out normally. Place the mouthpiece in your mouth and seal your lips tightly around it. Breathe in slowly and as deeply as possible, raising the piston or the ball toward the top of the column. Hold your breath for 3-5 seconds or for as long as possible. Allow the piston or ball to fall to the bottom of the column. Remove the mouthpiece from your mouth and breathe out normally. Rest for a few seconds and repeat Steps 1 through 7 at least 10 times  every 1-2 hours when you are awake. Take your time and take a few normal breaths between deep breaths. The spirometer may include an indicator to show your best effort. Use the indicator as a goal to work toward during each repetition. After each set of 10 deep breaths, practice  coughing to be sure your lungs are clear. If you have an incision (the cut made at the time of surgery), support your incision when coughing by placing a pillow or rolled up towels firmly against it. Once you are able to get out of bed, walk around indoors and cough well. You may stop using the incentive spirometer when instructed by your caregiver.  RISKS AND COMPLICATIONS Take your time so you do not get dizzy or light-headed. If you are in pain, you may need to take or ask for pain medication before doing incentive spirometry. It is harder to take a deep breath if you are having pain. AFTER USE Rest and breathe slowly and easily. It can be helpful to keep track of a log of your progress. Your caregiver can provide you with a simple table to help with this. If you are using the spirometer at home, follow these instructions: [place] IF:  You are having difficultly using the spirometer. You have trouble using the spirometer as often as instructed. Your pain medication is not giving enough relief while using the spirometer. You develop fever of 100.5 F (38.1 C) or higher. SEEK IMMEDIATE MEDICAL CARE IF:  You cough up bloody sputum that had not been present before. You develop fever of 102 F (38.9 C) or greater. You develop worsening pain at or near the incision site. MAKE SURE YOU:  Understand these instructions. Will watch your condition. Will get help right away if you are not doing well or get worse. Document Released: 03/02/2007 Document Revised: 01/12/2012 Document Reviewed: 05/03/2007 Exit[employer] Patient Information 2014 Exit[employer], [place].   ________________________________________________________________________  WHAT IS A BLOOD TRANSFUSION? Blood Transfusion Information  A transfusion is the replacement of blood or some of its parts. Blood is made up of multiple cells which provide different functions. Red blood cells carry oxygen and are used for blood loss  replacement. White blood cells fight against infection. Platelets control bleeding. Plasma helps clot blood. Other blood products are available for specialized needs, such as hemophilia or other clotting disorders. BEFORE THE TRANSFUSION  Who gives blood for transfusions?  Healthy volunteers who are fully evaluated to make sure their blood is safe. This is blood bank blood. Transfusion therapy is the safest it has ever been in the practice of medicine. Before blood is taken from a donor, a complete history is taken to make sure that person has no history of diseases nor engages in risky social behavior (examples are intravenous drug use or sexual activity with multiple partners). The donor's travel history is screened to minimize risk of transmitting infections, such as malaria. The donated blood is tested for signs of infectious diseases, such as HIV and hepatitis. The blood is then tested to be sure it is compatible with you in order to minimize the chance of a transfusion reaction. If you or a relative donates blood, this is often done in anticipation of surgery and is not appropriate for emergency situations. It takes many days to process the donated blood. RISKS AND COMPLICATIONS Although transfusion therapy is very safe and saves many lives, the main dangers of transfusion include:  Getting an infectious disease. Developing a transfusion reaction. This is  an allergic reaction to something in the blood you were given. Every precaution is taken to prevent this. The decision to have a blood transfusion has been considered carefully by your caregiver before blood is given. Blood is not given unless the benefits outweigh the risks. AFTER THE TRANSFUSION Right after receiving a blood transfusion, you will usually feel much better and more energetic. This is especially true if your red blood cells have gotten low (anemic). The transfusion raises the level of the red blood cells which carry oxygen, and  this usually causes an energy increase. The nurse administering the transfusion will monitor you carefully for complications. HOME CARE INSTRUCTIONS  No special instructions are needed after a transfusion. You may find your energy is better. Speak with your caregiver about any limitations on activity for underlying diseases you may have. SEEK MEDICAL CARE IF:  Your condition is not improving after your transfusion. You develop redness or irritation at the intravenous (IV) site. SEEK IMMEDIATE MEDICAL CARE IF:  Any of the following symptoms occur over the next 12 hours: Shaking chills. You have a temperature by mouth above 102 F (38.9 C), not controlled by medicine. Chest, back, or muscle pain. People around you feel you are not acting correctly or are confused. Shortness of breath or difficulty breathing. Dizziness and fainting. You get a rash or develop hives. You have a decrease in urine output. Your urine turns a dark color or changes to pink, red, or brown. Any of the following symptoms occur over the next 10 days: You have a temperature by mouth above 102 F (38.9 C), not controlled by medicine. Shortness of breath. Weakness after normal activity. The white part of the eye turns yellow (jaundice). You have a decrease in the amount of urine or are urinating less often. Your urine turns a dark color or changes to pink, red, or brown. Document Released: 10/17/2000 Document Revised: 01/12/2012 Document Reviewed: 06/05/2008 [facility] Patient Information 2014 [place], [place].  _______________________________________________________________________

## 2021-09-04 DIAGNOSIS — J449 Chronic obstructive pulmonary disease, unspecified: Secondary | ICD-10-CM | POA: Diagnosis not present

## 2021-09-04 DIAGNOSIS — I130 Hypertensive heart and chronic kidney disease with heart failure and stage 1 through stage 4 chronic kidney disease, or unspecified chronic kidney disease: Secondary | ICD-10-CM | POA: Diagnosis not present

## 2021-09-04 DIAGNOSIS — N1832 Chronic kidney disease, stage 3b: Secondary | ICD-10-CM | POA: Diagnosis not present

## 2021-09-04 DIAGNOSIS — J9612 Chronic respiratory failure with hypercapnia: Secondary | ICD-10-CM | POA: Diagnosis not present

## 2021-09-04 DIAGNOSIS — I5033 Acute on chronic diastolic (congestive) heart failure: Secondary | ICD-10-CM | POA: Diagnosis not present

## 2021-09-04 DIAGNOSIS — J9611 Chronic respiratory failure with hypoxia: Secondary | ICD-10-CM | POA: Diagnosis not present

## 2021-09-05 DIAGNOSIS — I5033 Acute on chronic diastolic (congestive) heart failure: Secondary | ICD-10-CM | POA: Diagnosis not present

## 2021-09-05 DIAGNOSIS — I130 Hypertensive heart and chronic kidney disease with heart failure and stage 1 through stage 4 chronic kidney disease, or unspecified chronic kidney disease: Secondary | ICD-10-CM | POA: Diagnosis not present

## 2021-09-05 DIAGNOSIS — J449 Chronic obstructive pulmonary disease, unspecified: Secondary | ICD-10-CM | POA: Diagnosis not present

## 2021-09-05 DIAGNOSIS — J9611 Chronic respiratory failure with hypoxia: Secondary | ICD-10-CM | POA: Diagnosis not present

## 2021-09-05 DIAGNOSIS — N1832 Chronic kidney disease, stage 3b: Secondary | ICD-10-CM | POA: Diagnosis not present

## 2021-09-05 DIAGNOSIS — J9612 Chronic respiratory failure with hypercapnia: Secondary | ICD-10-CM | POA: Diagnosis not present

## 2021-09-06 ENCOUNTER — Other Ambulatory Visit: Payer: Self-pay

## 2021-09-06 ENCOUNTER — Encounter (HOSPITAL_COMMUNITY): Payer: Self-pay

## 2021-09-06 ENCOUNTER — Encounter (HOSPITAL_COMMUNITY)
Admission: RE | Admit: 2021-09-06 | Discharge: 2021-09-06 | Disposition: A | Discharge status: Home / Self Care | Payer: Medicare Other | Source: Ambulatory Visit | Attending: Orthopedic Surgery | Admitting: Orthopedic Surgery

## 2021-09-06 VITALS — BP 145/80 | HR 80 | Temp 99.1°F | Resp 16 | Ht 59.0 in | Wt 199.0 lb

## 2021-09-06 DIAGNOSIS — I480 Paroxysmal atrial fibrillation: Secondary | ICD-10-CM | POA: Insufficient documentation

## 2021-09-06 DIAGNOSIS — J449 Chronic obstructive pulmonary disease, unspecified: Secondary | ICD-10-CM | POA: Insufficient documentation

## 2021-09-06 DIAGNOSIS — K219 Gastro-esophageal reflux disease without esophagitis: Secondary | ICD-10-CM | POA: Insufficient documentation

## 2021-09-06 DIAGNOSIS — M1612 Unilateral primary osteoarthritis, left hip: Secondary | ICD-10-CM | POA: Insufficient documentation

## 2021-09-06 DIAGNOSIS — Z01812 Encounter for preprocedural laboratory examination: Secondary | ICD-10-CM | POA: Diagnosis not present

## 2021-09-06 DIAGNOSIS — Z87891 Personal history of nicotine dependence: Secondary | ICD-10-CM | POA: Insufficient documentation

## 2021-09-06 DIAGNOSIS — N183 Chronic kidney disease, stage 3 unspecified: Secondary | ICD-10-CM | POA: Insufficient documentation

## 2021-09-06 DIAGNOSIS — Z951 Presence of aortocoronary bypass graft: Secondary | ICD-10-CM | POA: Insufficient documentation

## 2021-09-06 DIAGNOSIS — Z7901 Long term (current) use of anticoagulants: Secondary | ICD-10-CM | POA: Diagnosis not present

## 2021-09-06 DIAGNOSIS — I2510 Atherosclerotic heart disease of native coronary artery without angina pectoris: Secondary | ICD-10-CM | POA: Diagnosis not present

## 2021-09-06 DIAGNOSIS — I129 Hypertensive chronic kidney disease with stage 1 through stage 4 chronic kidney disease, or unspecified chronic kidney disease: Secondary | ICD-10-CM | POA: Diagnosis not present

## 2021-09-06 DIAGNOSIS — D649 Anemia, unspecified: Secondary | ICD-10-CM | POA: Insufficient documentation

## 2021-09-06 DIAGNOSIS — Z01818 Encounter for other preprocedural examination: Secondary | ICD-10-CM

## 2021-09-06 DIAGNOSIS — Z9981 Dependence on supplemental oxygen: Secondary | ICD-10-CM | POA: Insufficient documentation

## 2021-09-06 HISTORY — DX: Depression, unspecified: F32.A

## 2021-09-06 HISTORY — DX: Psoriasis, unspecified: L40.9

## 2021-09-06 HISTORY — DX: Dependence on other enabling machines and devices: Z99.89

## 2021-09-06 HISTORY — DX: Anemia, unspecified: D64.9

## 2021-09-06 HISTORY — DX: Cardiac arrhythmia, unspecified: I49.9

## 2021-09-06 LAB — CBC
HCT: 31.8 % — ABNORMAL LOW (ref 36.0–46.0)
Hemoglobin: 9.4 g/dL — ABNORMAL LOW (ref 12.0–15.0)
MCH: 27.9 pg (ref 26.0–34.0)
MCHC: 29.6 g/dL — ABNORMAL LOW (ref 30.0–36.0)
MCV: 94.4 fL (ref 80.0–100.0)
Platelets: 218 10*3/uL (ref 150–400)
RBC: 3.37 MIL/uL — ABNORMAL LOW (ref 3.87–5.11)
RDW: 16.3 % — ABNORMAL HIGH (ref 11.5–15.5)
WBC: 9.6 10*3/uL (ref 4.0–10.5)
nRBC: 0 % (ref 0.0–0.2)

## 2021-09-06 LAB — COMPREHENSIVE METABOLIC PANEL
ALT: 14 U/L (ref 0–44)
AST: 20 U/L (ref 15–41)
Albumin: 3.8 g/dL (ref 3.5–5.0)
Alkaline Phosphatase: 55 U/L (ref 38–126)
Anion gap: 8 (ref 5–15)
BUN: 42 mg/dL — ABNORMAL HIGH (ref 8–23)
CO2: 32 mmol/L (ref 22–32)
Calcium: 9.6 mg/dL (ref 8.9–10.3)
Chloride: 100 mmol/L (ref 98–111)
Creatinine, Ser: 1.5 mg/dL — ABNORMAL HIGH (ref 0.44–1.00)
GFR, Estimated: 36 mL/min — ABNORMAL LOW (ref >60)
Glucose, Bld: 108 mg/dL — ABNORMAL HIGH (ref 70–99)
Potassium: 5 mmol/L (ref 3.5–5.1)
Sodium: 140 mmol/L (ref 135–145)
Total Bilirubin: 0.5 mg/dL (ref 0.3–1.2)
Total Protein: 7.1 g/dL (ref 6.5–8.1)

## 2021-09-06 LAB — PROTIME-INR
INR: 1.1 (ref 0.8–1.2)
Prothrombin Time: 14.2 seconds (ref 11.4–15.2)

## 2021-09-06 NOTE — Progress Notes (Addendum)
PCP - Ted Mcalpine ,MD Cardiologist - Clearance noted in epic Judy Pimple PA-C 08-29-21  DR. Chandrasekhar  Pulm- clearance Sandrea Hughs , MD 08-08-21 epic PPM/ICD -  Device Orders -  Rep Notified -   Chest x-ray - 06-06-21 epic EKG - 07-29-21 epic Stress Test -  ECHO - 08-07-21 epic Cardiac Cath -   Sleep Study -  CPAP -   Fasting Blood Sugar -  Checks Blood Sugar _____ times a day  Blood Thinner Instructions:Elequis hold 3 days Aspirin Instructions:  ERAS Protcol - PRE-SURGERY Ensure   COVID TEST- 11-10  Activity-- Anesthesia review: CAD,CHF,HTN,MI,CABG 2016, COPD III, A-fib, CKD, Pulm fibrosis, O2 2 liters continous, Bipap at night, BMI 40.19 pt. dieting  Patient denies shortness of breath, fever, cough and chest pain at PAT appointment   All instructions explained to the patient, with a verbal understanding of the material. Patient agrees to go over the instructions while at home for a better understanding. Patient also instructed to self quarantine after being tested for COVID-19. The opportunity to ask questions was provided.

## 2021-09-09 LAB — SURGICAL PCR SCREEN
MRSA, PCR: POSITIVE — AB
Staphylococcus aureus: POSITIVE — AB

## 2021-09-09 NOTE — Anesthesia Preprocedure Evaluation (Addendum)
Anesthesia Evaluation  Patient identified by MRN, date of birth, ID band Patient awake    Reviewed: Allergy & Precautions, NPO status , Patient's Chart, lab work & pertinent test results  Airway Mallampati: II  TM Distance: >3 FB Neck ROM: Full    Dental no notable dental hx.    Pulmonary asthma , COPD,  COPD inhaler and oxygen dependent, former smoker,  ILD   breath sounds clear to auscultation + decreased breath sounds+ wheezing      Cardiovascular hypertension, + CAD, + Past MI, + CABG and +CHF  Normal cardiovascular exam Rhythm:Regular Rate:Normal     Neuro/Psych negative neurological ROS  negative psych ROS   GI/Hepatic negative GI ROS, Neg liver ROS,   Endo/Other  Hypothyroidism   Renal/GU negative Renal ROS  negative genitourinary   Musculoskeletal negative musculoskeletal ROS (+)   Abdominal   Peds negative pediatric ROS (+)  Hematology negative hematology ROS (+)   Anesthesia Other Findings   Reproductive/Obstetrics negative OB ROS                            Anesthesia Physical Anesthesia Plan  ASA: 3  Anesthesia Plan: Spinal   Post-op Pain Management:    Induction: Intravenous  PONV Risk Score and Plan: 2 and Ondansetron, Dexamethasone and Treatment may vary due to age or medical condition  Airway Management Planned: Simple Face Mask  Additional Equipment:   Intra-op Plan:   Post-operative Plan:   Informed Consent: I have reviewed the patients History and Physical, chart, labs and discussed the procedure including the risks, benefits and alternatives for the proposed anesthesia with the patient or authorized representative who has indicated his/her understanding and acceptance.     Dental advisory given  Plan Discussed with: CRNA and Surgeon  Anesthesia Plan Comments: (See PAT note 09/06/21, Jodell Cipro Ward, PA-C)       Anesthesia Quick  Evaluation

## 2021-09-09 NOTE — Progress Notes (Signed)
Anesthesia Chart Review   Case: [id number] Date/Time: 09/16/21 1432   Procedure: TOTAL HIP ARTHROPLASTY ANTERIOR APPROACH (Left: Hip)   Anesthesia type: Choice   Pre-op diagnosis: left hip osteoarthritis   Location: [facility] 10 / [facility]   Surgeons: Gaynelle Arabian, MD       DISCUSSION:75 y.o. former smoker with h/o GERD, HTN, COPD II O2 dependent, CAD CABG, CKD Stage III, PAF on Eliquis, anemia, left hip OA scheduled for above procedure 09/13/2021 with Dr. Gaynelle Arabian.   Per cardiology preoperative evaluation 08/29/2021, "Chart reviewed as part of pre-operative protocol coverage. Patient was contacted 08/29/2021 in reference to pre-operative risk assessment for pending surgery as outlined below.  [name] was last seen on 07/29/21 by Dr. Gasper Sells.  Since that day, [name] has done fine from a cardiac standpoint. She remains quite limited in her activity due to hip pain and is unable to complete 4 METs. At her last visit with Dr. Gasper Sells, she was recommended to undergo an echocardiogram to evaluate LV function for preop assessment which showed EF 65-70%, mild LVH, G2DD, no RWMA, mild LAE, and no significant valvular abnormalities.  Given lack of symptoms and reassuring echocardiogram, the patient would be at acceptable risk for the planned procedure without further cardiovascular testing.  The patient was advised that if she develops new symptoms prior to surgery to contact our office to arrange for a follow-up visit, and she verbalized understanding. Per pharmacy recommendations, patient can hold eliquis 3 days prior to her upcoming surgery with plans to restart therapeutic dosing 24 hr after procedure if bleeding risk is acceptable."  Pt seen by pulmonology 06/24/21. In regards to preoperative evaluation Dr. Melvyn Novas states, "Likely scenario post op will resume NIV dep in bed but be able to mobilize daytime to NP with titration daytime to keep sats > 90%   Worse case  scenario, which I have asked her to discuss with daughter [name] preop is  Trach/ vent dep post op which she says she would not be willing to commit to and would likely to agrees to a plliative approach as full NCB status if this developed.   With this in consideration I cleared her for THR as she can't tol the pain any longer."  Hemoglobin 9.4, labs forwarded to Dr. Wynelle Link.   Anticipate pt can proceed with planned procedure barring acute status change.   VS: BP (!) 145/80   Pulse 80   Temp 37.3 C (Oral)   Resp 16   Ht 4\' 11"  (1.499 m)   Wt 90.3 kg   SpO2 97%   BMI 40.19 kg/m   PROVIDERS: Isaac Bliss, Rayford Halsted, MD is PCP   Primary Cardiologist: Werner Lean, MD LABS:  labs forwarded to Dr. Wynelle Link (all labs ordered are listed, but only abnormal results are displayed)  Labs Reviewed  SURGICAL PCR SCREEN - Abnormal; Notable for the following components:      Result Value   MRSA, PCR POSITIVE (*)    Staphylococcus aureus POSITIVE (*)    All other components within normal limits  CBC - Abnormal; Notable for the following components:   RBC 3.37 (*)    Hemoglobin 9.4 (*)    HCT 31.8 (*)    MCHC 29.6 (*)    RDW 16.3 (*)    All other components within normal limits  COMPREHENSIVE METABOLIC PANEL - Abnormal; Notable for the following components:   Glucose, Bld 108 (*)    BUN 42 (*)  Creatinine, Ser 1.50 (*)    GFR, Estimated 36 (*)    All other components within normal limits  PROTIME-INR  TYPE AND SCREEN     IMAGES:   EKG: 07/29/2021 Rate 79 bpm  SR  CV: Echo 08/07/2021  1. Left ventricular ejection fraction, by estimation, is 65 to 70%. The  left ventricle has normal function. The left ventricle has no regional  wall motion abnormalities. There is mild left ventricular hypertrophy.  Left ventricular diastolic parameters  are consistent with Grade II diastolic dysfunction (pseudonormalization).   2. Right ventricular systolic function is  normal. The right ventricular  size is normal.   3. Left atrial size was mildly dilated.   4. The mitral valve is grossly normal. Trivial mitral valve  regurgitation. Moderate mitral annular calcification.   5. The aortic valve is grossly normal. Aortic valve regurgitation is not  visualized. No aortic stenosis is present.  Past Medical History:  Diagnosis Date   Anemia    Arthritis    OA RIGHT KNEE WITH PAIN   Barrett esophagus    BiPAP (biphasic positive airway pressure)    Wears at night   Bradycardia 06/01/2015   CAD in native artery    a. NSTEMI 05/2015 s/p emergent CABG.   Chronic diastolic (congestive) heart failure (HCC)    Chronic kidney disease, stage 3a (HCC)    Chronic respiratory failure (HCC)    Chronic respiratory failure with hypoxia and hypercapnia (HCC) 02/04/2010   Followed in Pulmonary clinic/ [facility]/ [place]       - 02 dependent  since 07/02/10 >>  83% RA December 05, 2010       - ONO RA 08/05/12  :  Positive sat < 89 x 2:18m> repeat on 2lpm rec 08/12/2012  - 06/17/2013 reported desat with activity p Knee surgery > rec restart 2lpm with activity  - 06/27/2013   Walked 2lpm  x one lap @ 185 stopped due to sat 88% not sob , desat to 82% on RA just at th   COPD III spirometry if use FEV1/VC p saba  07/18/2010   Quit smoking May 2006       - PFT's  04/12/10 FEV1  1.21 (69%) ratio 77 and no change p B2,  DLC0 56%   VC 70%         - PFTs  08/08/2013 FEV1 1.21 (60%) ratio 86 and no change p B2 DLCO 79%  VC 72%  On symbicort 160 2bid  - PFT's  02/08/2018  FEV1 0.70 (40 % ) ratio 56 if use FEV1/VC  p 38 % improvement from saba p symb 160 prior to study with DLCO  78 % corrects to 147  % for alv volume   - 02/08/2018   Cough variant asthma 02/26/2011   Followed in Pulmonary clinic/ [facility]/ [place]  - PFT's  06/04/15  FEV1 1.20 (67 % ) ratio 83  p 6 % improvement from saba with DLCO  80 % corrects to 132 % for alv volume      - Clinical dx based on response to symbicort        FENO 09/16/2016  =   96 on symbicort 160 2bid > added singulair  Allergy profile 09/16/2016 >  Eos 0.5 /  IgE  78 neg RAST  -  Referred to rehab 04/29/2017 > completed   Depression    Dysrhythmia    Afib   Essential hypertension 04/20/2007   Qualifier: Diagnosis  of  By: Paulina Fusi, RN, Daine Gravel    GERD (gastroesophageal reflux disease)    History of ARDS 2006   History of home oxygen therapy    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 small intracranial hemorrhage in setting of HTN.   Mild carotid artery disease ([place])    a. Duplex 1-39% bilaterally in 2016.   Morbid (severe) obesity due to excess calories ([place]) 04/22/2015   pfts with erv 14% 06/04/15  And 33% 02/08/2018    NSTEMI (non-ST elevated myocardial infarction) ([place]) 05/31/2015   Pneumococcal pneumonia ([place]) 2006   HOSPITALIZED AND DEVELOPED ARDS   Psoriasis    Psoriatic arthritis ([place])    PULMONARY FIBROSIS ILD POST INFLAMMATORY CHRONIC 07/18/2010   Followed as Primary Care Patient/ [facility]/ [place]  -s/p ARDS 2006 with bacteremic S  Pna       - CT chest 07/03/10 Nonspecific PF mostly upper lobes       - CT chest 12/03/10 acute gg changes and effusions c/w chf - PFT's  02/08/2018  FVC 0.64 (28 %)   with DLCO  78 % corrects to 147 % for alv volume      Rhematoid arthritis    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   SOB (shortness of breath) on exertion     Past Surgical History:  Procedure Laterality Date   ABDOMINOPLASTY     CARDIAC CATHETERIZATION N/A 06/01/2015   Procedure: Left Heart Cath and Coronary Angiography;  Surgeon: Lorretta Harp, MD;  Location: [facility];  Service: Cardiovascular;  Laterality: N/A;   CARDIOVERSION N/A 02/12/2021   Procedure: CARDIOVERSION;  Surgeon: Freada Bergeron, MD;  Location: [facility] ENDOSCOPY;  Service: Cardiovascular;  Laterality: N/A;   CARPAL TUNNEL RELEASE     CHOLECYSTECTOMY     CORONARY ARTERY BYPASS GRAFT  N/A 06/04/2015   Procedure: CORONARY ARTERY BYPASS GRAFT times three            with left internal mammary artery and right leg saphenous vein;  Surgeon: Gaye Pollack, MD;  Location: [facility] OR;  Service: Open Heart Surgery;  Laterality: N/A;   cosmetic breast surgery     EYE SURGERY     cataract   JOINT REPLACEMENT     KNEE ARTHROSCOPY Left    TEE WITHOUT CARDIOVERSION  06/04/2015   Procedure: TRANSESOPHAGEAL ECHOCARDIOGRAM (TEE);  Surgeon: Gaye Pollack, MD;  Location: [facility] OR;  Service: Open Heart Surgery;;   TEE WITHOUT CARDIOVERSION N/A 02/12/2021   Procedure: TRANSESOPHAGEAL ECHOCARDIOGRAM (TEE);  Surgeon: Freada Bergeron, MD;  Location[facility] ENDOSCOPY;  Service: Cardiovascular;  Laterality: N/A;   TOTAL KNEE ARTHROPLASTY Left    TOTAL KNEE ARTHROPLASTY Right 06/06/2013   Procedure: RIGHT TOTAL KNEE ARTHROPLASTY;  Surgeon: Gearlean Alf, MD;  [facility];  Service: Orthopedics;  Laterality: Right;    MEDICATIONS:  acetaminophen (TYLENOL) 650 MG CR tablet   albuterol (VENTOLIN HFA) 108 (90 Base) MCG/ACT inhaler   apixaban (ELIQUIS) 5 MG TABS tablet   atorvastatin (LIPITOR) 40 MG tablet   budesonide (PULMICORT) 0.25 MG/2ML nebulizer solution   CALCIUM PO   Cholecalciferol (VITAMIN D3) 2000 UNITS TABS   clotrimazole-betamethasone (LOTRISONE) cream   cyclobenzaprine (FLEXERIL) 5 MG tablet   diltiazem (CARDIZEM CD) 240 MG 24 hr capsule   dofetilide (TIKOSYN) 250 MCG capsule   doxycycline (VIBRA-TABS) 100 MG tablet   DULoxetine (CYMBALTA)  30 MG capsule   DULoxetine 40 MG CPEP   ferrous sulfate 325 (65 FE) MG tablet   formoterol (PERFOROMIST) 20 MCG/2ML nebulizer solution   golimumab (SIMPONI ARIA) 50 MG/4ML SOLN injection   hydrocortisone 2.5 % cream   hydroxypropyl methylcellulose / hypromellose (ISOPTO TEARS / GONIOVISC) 2.5 % ophthalmic solution   ketoconazole (NIZORAL) 2 % cream   leflunomide (ARAVA) 20 MG tablet   levothyroxine (SYNTHROID) 125 MCG tablet   losartan  (COZAAR) 50 MG tablet   Magnesium Oxide 400 MG CAPS   montelukast (SINGULAIR) 10 MG tablet   nitroGLYCERIN (NITROSTAT) 0.4 MG SL tablet   nystatin (MYCOSTATIN/NYSTOP) powder   OXYGEN   pantoprazole (PROTONIX) 40 MG tablet   pregabalin (LYRICA) 50 MG capsule   torsemide (DEMADEX) 20 MG tablet   traMADol (ULTRAM) 50 MG tablet   triamcinolone cream (KENALOG) 0.1 %   umeclidinium bromide (INCRUSE ELLIPTA) 62.5 MCG/INH AEPB   vitamin B-12 (CYANOCOBALAMIN) 1000 MCG tablet   No current facility-administered medications for this encounter.     Konrad Felix Ward, PA-C [facility] [phone]

## 2021-09-10 DIAGNOSIS — J9612 Chronic respiratory failure with hypercapnia: Secondary | ICD-10-CM | POA: Diagnosis not present

## 2021-09-10 DIAGNOSIS — I5033 Acute on chronic diastolic (congestive) heart failure: Secondary | ICD-10-CM | POA: Diagnosis not present

## 2021-09-10 DIAGNOSIS — N1832 Chronic kidney disease, stage 3b: Secondary | ICD-10-CM | POA: Diagnosis not present

## 2021-09-10 DIAGNOSIS — J449 Chronic obstructive pulmonary disease, unspecified: Secondary | ICD-10-CM | POA: Diagnosis not present

## 2021-09-10 DIAGNOSIS — J9611 Chronic respiratory failure with hypoxia: Secondary | ICD-10-CM | POA: Diagnosis not present

## 2021-09-10 DIAGNOSIS — I130 Hypertensive heart and chronic kidney disease with heart failure and stage 1 through stage 4 chronic kidney disease, or unspecified chronic kidney disease: Secondary | ICD-10-CM | POA: Diagnosis not present

## 2021-09-11 ENCOUNTER — Other Ambulatory Visit: Payer: Self-pay

## 2021-09-11 MED ORDER — DILTIAZEM HCL ER COATED BEADS 240 MG PO CP24: 240.0000 mg | ORAL_CAPSULE | Freq: Every day | ORAL | 3 refills | Status: AC

## 2021-09-12 ENCOUNTER — Telehealth: Payer: Self-pay | Admitting: Pharmacist

## 2021-09-12 ENCOUNTER — Other Ambulatory Visit: Payer: Self-pay | Admitting: Orthopedic Surgery

## 2021-09-12 DIAGNOSIS — N1832 Chronic kidney disease, stage 3b: Secondary | ICD-10-CM | POA: Diagnosis not present

## 2021-09-12 DIAGNOSIS — J449 Chronic obstructive pulmonary disease, unspecified: Secondary | ICD-10-CM | POA: Diagnosis not present

## 2021-09-12 DIAGNOSIS — I130 Hypertensive heart and chronic kidney disease with heart failure and stage 1 through stage 4 chronic kidney disease, or unspecified chronic kidney disease: Secondary | ICD-10-CM | POA: Diagnosis not present

## 2021-09-12 DIAGNOSIS — J9612 Chronic respiratory failure with hypercapnia: Secondary | ICD-10-CM | POA: Diagnosis not present

## 2021-09-12 DIAGNOSIS — J9611 Chronic respiratory failure with hypoxia: Secondary | ICD-10-CM | POA: Diagnosis not present

## 2021-09-12 DIAGNOSIS — I5033 Acute on chronic diastolic (congestive) heart failure: Secondary | ICD-10-CM | POA: Diagnosis not present

## 2021-09-12 LAB — SARS CORONAVIRUS 2 (TAT 6-24 HRS): SARS Coronavirus 2: NEGATIVE

## 2021-09-12 NOTE — Chronic Care Management (AMB) (Signed)
Chronic Care Management Pharmacy Assistant   Name: [name]  MRN: [id number] DOB: 1946/04/23  Reason for Encounter: Disease State Hypertension Assessment Call   Conditions to be addressed/monitored: HTN  Recent office visits:  08/06/21 Isaac Bliss, Rayford Halsted, MD - Patient presented for Flu Vaccine and other concerns. Stopped Prednisone 20 MG  Recent consult visits:  09/06/21 Lazarus Gowda MD - Patient presented to [facility] for Pre- Admission Tests  08/09/21 Lamount Cohen MD- (Radiology) - Patient presented for constipation due to pain meds. XR of abdomen done. No medication changes.  Hospital visits:    Patient presented to [facility] on 09/16/21 due to Osteoarthritis. Patient was there for 7 days.  New?Medications Started at [facility] Discharge:?? -started  HYDROcodone-acetaminophen (NORCO/VICODIN)  Medication Changes at Hospital Discharge: -Changed  DULoxetine (CYMBALTA)  Medications Discontinued at Hospital Discharge: -Stopped doxycycline 100 MG tablet (VIBRA-TABS)  Medications that remain the same after Hospital Discharge:??  -All other medications will remain the same.      Medication Reconciliation was completed by comparing discharge summary, patient's EMR and Pharmacy list, and upon discussion with patient.  Patient presented to [facility] on 06/17/21 due to Acute respiratory failure with hypoxia and hypercapnia. Patient was there for 4 days.  New?Medications Started at [facility] Discharge:?? -started  None  Medication Changes at Hospital Discharge: -Changed  torsemide [facility])  Medications Discontinued at Hospital Discharge: -Stopped amoxicillin 500 MG tablet  Medications that remain the same after Hospital Discharge:??  -All other medications will remain the same.    Medications: Outpatient Encounter Medications as of 09/12/2021  Medication Sig   acetaminophen (TYLENOL)  650 MG CR tablet Take 650 mg by mouth every 8 (eight) hours as needed for pain.   albuterol (VENTOLIN HFA) 108 (90 Base) MCG/ACT inhaler Inhale 2 puffs into the lungs every 4 (four) hours as needed (shortness of breath, if you can't catch your breath).   apixaban (ELIQUIS) 5 MG TABS tablet Take 1 tablet (5 mg total) by mouth 2 (two) times daily.   atorvastatin (LIPITOR) 40 MG tablet Take 40 mg by mouth daily.   budesonide (PULMICORT) 0.25 MG/2ML nebulizer solution One twice daily   CALCIUM PO Take 1 tablet by mouth daily.   Cholecalciferol (VITAMIN D3) 2000 UNITS TABS Take 2,000 Units by mouth daily.   clotrimazole-betamethasone (LOTRISONE) cream Apply 1 application topically 2 (two) times daily as needed. (Patient not taking: No sig reported)   cyclobenzaprine (FLEXERIL) 5 MG tablet Take 5 mg by mouth at bedtime.   diltiazem (CARDIZEM CD) 240 MG 24 hr capsule Take 1 capsule (240 mg total) by mouth daily.   dofetilide (TIKOSYN) 250 MCG capsule Take 1 capsule by mouth twice daily   doxycycline (VIBRA-TABS) 100 MG tablet Take 1 tablet (100 mg total) by mouth 2 (two) times daily.   DULoxetine (CYMBALTA) 30 MG capsule Take 30 mg by mouth daily.   DULoxetine 40 MG CPEP Take 40 mg by mouth daily. (Patient not taking: No sig reported)   ferrous sulfate 325 (65 FE) MG tablet Take 1 tablet (325 mg total) by mouth daily with breakfast.   formoterol (PERFOROMIST) 20 MCG/2ML nebulizer solution Take 2 mLs (20 mcg total) by nebulization 2 (two) times daily. Use in nebulizer twice daily perfectly regularly   golimumab (SIMPONI ARIA) 50 MG/4ML SOLN injection Inject 50 mg into the vein every 8 (eight) weeks.    hydrocortisone 2.5 % cream Apply 1 application topically daily  as needed for itching.   hydroxypropyl methylcellulose / hypromellose (ISOPTO TEARS / GONIOVISC) 2.5 % ophthalmic solution Place 1 drop into both eyes 3 (three) times daily as needed for dry eyes.   ketoconazole (NIZORAL) 2 % cream Apply 1  application topically daily as needed for irritation.   leflunomide (ARAVA) 20 MG tablet Take 1 tablet (20 mg total) by mouth daily.   levothyroxine (SYNTHROID) 125 MCG tablet TAKE 1 TABLET BY MOUTH EVERY DAY   losartan (COZAAR) 50 MG tablet Take 1 tablet (50 mg total) by mouth daily.   Magnesium Oxide 400 MG CAPS Take 1 capsule (400 mg total) by mouth daily. (Patient not taking: No sig reported)   montelukast (SINGULAIR) 10 MG tablet Take 10 mg by mouth daily.   nitroGLYCERIN (NITROSTAT) 0.4 MG SL tablet Place 1 tablet (0.4 mg total) under the tongue every 5 (five) minutes as needed for chest pain.   nystatin (MYCOSTATIN/NYSTOP) powder APPLY 1 APPLICATION        TOPICALLY 3 TIMES A DAY (Patient taking differently: Apply 1 application topically daily as needed (psoriasis).)   OXYGEN Inhale 2 L into the lungs continuous.   pantoprazole (PROTONIX) 40 MG tablet Take 1 tablet (40 mg total) by mouth daily before breakfast.   pregabalin (LYRICA) 50 MG capsule Take 50 mg by mouth at bedtime.   torsemide (DEMADEX) 20 MG tablet Take 1 tablet (20 mg total) by mouth 2 (two) times daily.   traMADol (ULTRAM) 50 MG tablet Take 50 mg by mouth 2 (two) times daily as needed for moderate pain.   triamcinolone cream (KENALOG) 0.1 % Apply 1 application topically 2 (two) times daily as needed (for psoriasis).    umeclidinium bromide (INCRUSE ELLIPTA) 62.5 MCG/INH AEPB Inhale 1 puff into the lungs daily.   vitamin B-12 (CYANOCOBALAMIN) 1000 MCG tablet Take 1,000 mcg by mouth daily.   No facility-administered encounter medications on file as of 09/12/2021.  Reviewed chart prior to disease state call. Spoke with patient regarding BP  Recent Office Vitals: BP Readings from Last 3 Encounters:  09/06/21 (!) 145/80  08/06/21 120/70  07/29/21 140/68   Pulse Readings from Last 3 Encounters:  09/06/21 80  08/06/21 77  07/29/21 82    Wt Readings from Last 3 Encounters:  09/06/21 199 lb (90.3 kg)  08/06/21 202 lb  14.4 oz (92 kg)  07/29/21 200 lb 6.4 oz (90.9 kg)     Kidney Function Lab Results  Component Value Date/Time   CREATININE 1.50 (H) 09/06/2021 02:51 PM   CREATININE 1.53 (H) 07/01/2021 10:47 AM   CREATININE 0.87 09/29/2016 01:35 PM   CREATININE 0.88 03/26/2016 12:15 PM   GFR 38.25 (L) 11/22/2020 12:18 PM   GFRNONAA 36 (L) 09/06/2021 02:51 PM   GFRAA 56 (L) 06/08/2020 07:29 AM    BMP Latest Ref Rng & Units 09/06/2021 07/01/2021 07/01/2021  Glucose 70 - 99 mg/dL 161(W) 960(A) -  BUN 8 - 23 mg/dL 54(U) 98(J) 19(J)  Creatinine 0.44 - 1.00 mg/dL 4.78(G) 9.56(O) 1.3(Y)  BUN/Creat Ratio 12 - 28 - 22 -  Sodium 135 - 145 mmol/L 140 142 142  Potassium 3.5 - 5.1 mmol/L 5.0 4.4 -  Chloride 98 - 111 mmol/L 100 96 -  CO2 22 - 32 mmol/L 32 32(H) -  Calcium 8.9 - 10.3 mg/dL 9.6 9.4 -    Current antihypertensive regimen:  Losartan (Cozaar) 50 mg - [facility] Atorvastatin (Lipitor) 40 mg - Last filled 06-16-2021 90  [facility] How often are you checking your Blood Pressure? infrequently Patient just had hip replacement and is recovering as of now.  Current home BP readings: 11/21 at [facility] 121/72 What recent interventions/DTPs have been made by any provider to improve Blood Pressure control since last CPP Visit: Patient reports no change Any recent hospitalizations or ED visits since last visit with CPP? Yes What diet changes have been made to improve Blood Pressure Control?  Patient reports she is living with  her daughter now and she takes care of the cooking. What exercise is being done to improve your Blood Pressure Control?  Patient reports she is moving around a little and doing her stretching exercises to rebuild her strength back up has not walked since April and her muscles are weak.  Adherence Review: Is the patient currently on ACE/ARB medication? Yes Does the patient have >5 day gap between last estimated fill dates? No  Notes: Patient reports she is doing ok  after discharge does not have any questions concerning her discharge at this time.   Care Gaps: BP - 121/72 (09/23/21) CCM-1/23 AWV- office aware to schedule as of 07/29/21 Hepatitis C Screening - Overdue Zoster Vaccines - Overdue Colonoscopy- Overdue COVID Booster #3(Pfizer) - Overdue TDAP- Overdue   Star Rating Drugs: Losartan (Cozaar) 50 mg - Last [facility]/20/2021 [facility] Atorvastatin (Lipitor) 40 mg - Last filled [facility]/11/2021 [facility]   Patent Assistance: Eliquis [employer]) - Mailed to pt 10/22 Incruse  ([place]) - Mailed to pt 10/22  [place] Pharmacist Assistant [phone]

## 2021-09-15 NOTE — H&P (Signed)
TOTAL HIP ADMISSION H&P  Patient is admitted for left total hip arthroplasty.  Subjective:  Chief Complaint: Left hip pain  HPI: [name], 75 y.o. female, has a history of pain and functional disability in the left hip due to arthritis and patient has failed non-surgical conservative treatments for greater than 12 weeks to include NSAID's and/or analgesics, flexibility and strengthening excercises, and activity modification. Onset of symptoms was gradual, starting  several  years ago with gradually worsening course since that time. The patient noted no past surgery on the left hip. Patient currently rates pain in the left hip at 7 out of 10 with activity. Patient has pain that interfers with activities of daily living and pain with passive range of motion. Patient has evidence of periarticular osteophytes and joint space narrowing by imaging studies. This condition presents safety issues increasing the risk of falls. There is no current active infection.  Patient Active Problem List   Diagnosis Date Noted   Acute on chronic respiratory failure with hypoxia and hypercapnia ([place]) 06/18/2021   Leukocytosis 06/18/2021   DNR (do not resuscitate) 06/18/2021   Persistent atrial fibrillation ([place]) 02/05/2021   Chronic heart failure with preserved ejection fraction ([place]) 01/07/2021   Stage 3b chronic kidney disease ([place]) 01/07/2021   Paroxysmal atrial fibrillation ([place]) 01/03/2021   Sciatica 12/27/2020   Diarrhea 12/27/2020   Atrial flutter ([place]) 12/25/2020   Secondary hypercoagulable state ([place]) 12/25/2020   Chronic respiratory failure with hypoxia and hypercapnia ([place]) 04/20/2019   Severe episode of recurrent major depressive disorder, without psychotic features ([place]) 11/09/2018   COPD (chronic obstructive pulmonary disease) ([place]) 11/02/2018   COPD with acute exacerbation ([place]) 11/01/2018   CAP (community acquired pneumonia) 10/28/2018   ICH (intracerebral hemorrhage) ([place])  08/25/2018   Hyperlipidemia 07/12/2015   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   Morbid (severe) obesity due to excess calories ([place]) 04/22/2015   Postoperative anemia due to acute blood loss 06/08/2013   History of home oxygen therapy 06/07/2013   GERD (gastroesophageal reflux disease) 06/07/2013   Barrett's esophagus 06/07/2013   OA (osteoarthritis) of knee 06/06/2013   COPD III spirometry if use FEV1/VC p saba  07/18/2010   PULMONARY FIBROSIS ILD POST INFLAMMATORY CHRONIC 07/18/2010   Compression fracture of thoracic vertebra ([place]) 07/02/2010   Hypothyroidism 04/20/2007   Essential hypertension 04/20/2007   PSORIATIC ARTHRITIS 04/20/2007    Past Medical History:  Diagnosis Date   Anemia    Arthritis    OA RIGHT KNEE WITH PAIN   Barrett esophagus    BiPAP (biphasic positive airway pressure)    Wears at night   Bradycardia 06/01/2015   CAD in native artery    a. NSTEMI 05/2015 s/p emergent CABG.   Chronic diastolic (congestive) heart failure (HCC)    Chronic kidney disease, stage 3a (HCC)    Chronic respiratory failure (HCC)    Chronic respiratory failure with hypoxia and hypercapnia ([place]) 02/04/2010   Followed in Pulmonary clinic/ [facility]/ [facility]       - 02 dependent  since 07/02/10 >>  83% RA December 05, 2010       - ONO RA 08/05/12  :  Positive sat < 89 x 2:64m> repeat on 2lpm rec 08/12/2012  - 06/17/2013 reported desat with activity p Knee surgery > rec restart 2lpm with activity  - 06/27/2013   Walked 2lpm  x one lap @ 185 stopped due to sat 88% not sob , desat to 82% on RA just at  th   COPD III spirometry if use FEV1/VC p saba  07/18/2010   Quit smoking May 2006       - PFT's  04/12/10 FEV1  1.21 (69%) ratio 77 and no change p B2,  DLC0 56%   VC 70%         - PFTs  08/08/2013 FEV1 1.21 (60%) ratio 86 and no change p B2 DLCO 79%  VC 72%  On symbicort 160 2bid  - PFT's  02/08/2018  FEV1 0.70 (40 % ) ratio 56 if use FEV1/VC  p 38 % improvement from saba p symb 160 prior to study with DLCO   78 % corrects to 147  % for alv volume   - 02/08/2018   Cough variant asthma 02/26/2011   Followed in Pulmonary clinic/ [facility]/ [facility]  - PFT's  06/04/15  FEV1 1.20 (67 % ) ratio 83  p 6 % improvement from saba with DLCO  80 % corrects to 132 % for alv volume      - Clinical dx based on response to symbicort       FENO 09/16/2016  =   96 on symbicort 160 2bid > added singulair  Allergy profile 09/16/2016 >  Eos 0.5 /  IgE  78 neg RAST  -  Referred to rehab 04/29/2017 > completed   Depression    Dysrhythmia    Afib   Essential hypertension 04/20/2007   Qualifier: Diagnosis of  By: Paulina Fusi, RN, Daine Gravel    GERD (gastroesophageal reflux disease)    History of ARDS 2006   History of home oxygen therapy    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 small intracranial hemorrhage in setting of HTN.   Mild carotid artery disease ([place])    a. Duplex 1-39% bilaterally in 2016.   Morbid (severe) obesity due to excess calories ([place]) 04/22/2015   pfts with erv 14% 06/04/15  And 33% 02/08/2018    NSTEMI (non-ST elevated myocardial infarction) ([place]) 05/31/2015   Pneumococcal pneumonia ([place]) 2006   HOSPITALIZED AND DEVELOPED ARDS   Psoriasis    Psoriatic arthritis ([place])    PULMONARY FIBROSIS ILD POST INFLAMMATORY CHRONIC 07/18/2010   Followed as Primary Care Patient/ [facility]/ [facility]  -s/p ARDS 2006 with bacteremic S  Pna       - CT chest 07/03/10 Nonspecific PF mostly upper lobes       - CT chest 12/03/10 acute gg changes and effusions c/w chf - PFT's  02/08/2018  FVC 0.64 (28 %)   with DLCO  78 % corrects to 147 % for alv volume      Rhematoid arthritis    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   SOB (shortness of breath) on exertion     Past Surgical History:  Procedure Laterality Date   ABDOMINOPLASTY     CARDIAC CATHETERIZATION N/A 06/01/2015   Procedure: Left Heart Cath and Coronary Angiography;  Surgeon: Lorretta Harp,  MD;  Location: [place] [facility] LAB;  Service: Cardiovascular;  Laterality: N/A;   CARDIOVERSION N/A 02/12/2021   Procedure: CARDIOVERSION;  Surgeon: Freada Bergeron, MD;  Location: [facility] ENDOSCOPY;  Service: Cardiovascular;  Laterality: N/A;   CARPAL TUNNEL RELEASE     CHOLECYSTECTOMY     CORONARY ARTERY BYPASS GRAFT N/A 06/04/2015   Procedure: CORONARY ARTERY BYPASS GRAFT times three  with left internal mammary artery and right leg saphenous vein;  Surgeon: Alleen Borne, MD;  Location: [facility] OR;  Service: Open Heart Surgery;  Laterality: N/A;   cosmetic breast surgery     EYE SURGERY     cataract   JOINT REPLACEMENT     KNEE ARTHROSCOPY Left    TEE WITHOUT CARDIOVERSION  06/04/2015   Procedure: TRANSESOPHAGEAL ECHOCARDIOGRAM (TEE);  Surgeon: Alleen Borne, MD;  Location[facility] OR;  Service: Open Heart Surgery;;   TEE WITHOUT CARDIOVERSION N/A 02/12/2021   Procedure: TRANSESOPHAGEAL ECHOCARDIOGRAM (TEE);  Surgeon: Meriam Sprague, MD;  Location[facility]COPY;  Service: Cardiovascular;  Laterality: N/A;   TOTAL KNEE ARTHROPLASTY Left    TOTAL KNEE ARTHROPLASTY Right 06/06/2013   Procedure: RIGHT TOTAL KNEE ARTHROPLASTY;  Surgeon: Loanne Drilling, MD;  Location: [facility] ORS;  Service: Orthopedics;  Laterality: Right;    Prior to Admission medications   Medication Sig Start Date End Date Taking? Authorizing Provider  acetaminophen (TYLENOL) 650 MG CR tablet Take 650 mg by mouth every 8 (eight) hours as needed for pain.   Yes [provider]  albuterol (VENTOLIN HFA) 108 (90 Base) MCG/ACT inhaler Inhale 2 puffs into the lungs every 4 (four) hours as needed (shortness of breath, if you can't catch your breath). 06/04/20  Yes Nyoka Cowden, MD  apixaban (ELIQUIS) 5 MG TABS tablet Take 1 tablet (5 mg total) by mouth 2 (two) times daily. 06/17/21  Yes Fenton, Clint R, PA  atorvastatin (LIPITOR) 40 MG tablet Take 40 mg by mouth daily.   Yes [provider]  budesonide  (PULMICORT) 0.25 MG/2ML nebulizer solution One twice daily 07/02/20  Yes Nyoka Cowden, MD  CALCIUM PO Take 1 tablet by mouth daily.   Yes [provider]  Cholecalciferol (VITAMIN D3) 2000 UNITS TABS Take 2,000 Units by mouth daily.   Yes [provider]  cyclobenzaprine (FLEXERIL) 5 MG tablet Take 5 mg by mouth at bedtime.   Yes [provider]  dofetilide (TIKOSYN) 250 MCG capsule Take 1 capsule by mouth twice daily 08/20/21  Yes Tillery, Mariam Dollar, PA-C  DULoxetine (CYMBALTA) 30 MG capsule Take 30 mg by mouth daily.   Yes [provider]  ferrous sulfate 325 (65 FE) MG tablet Take 1 tablet (325 mg total) by mouth daily with breakfast. 01/10/21 09/03/21 Yes Chandrasekhar, Mahesh A, MD  formoterol (PERFOROMIST) 20 MCG/2ML nebulizer solution Take 2 mLs (20 mcg total) by nebulization 2 (two) times daily. Use in nebulizer twice daily perfectly regularly 07/02/20  Yes Nyoka Cowden, MD  golimumab (SIMPONI ARIA) 50 MG/4ML SOLN injection Inject 50 mg into the vein every 8 (eight) weeks.    Yes [provider]  hydrocortisone 2.5 % cream Apply 1 application topically daily as needed for itching.   Yes [provider]  hydroxypropyl methylcellulose / hypromellose (ISOPTO TEARS / GONIOVISC) 2.5 % ophthalmic solution Place 1 drop into both eyes 3 (three) times daily as needed for dry eyes.   Yes [provider]  ketoconazole (NIZORAL) 2 % cream Apply 1 application topically daily as needed for irritation. 01/30/21  Yes [provider]  leflunomide (ARAVA) 20 MG tablet Take 1 tablet (20 mg total) by mouth daily. 03/15/19  Yes [name], [name], MD  levothyroxine (SYNTHROID) 125 MCG tablet TAKE 1 TABLET BY MOUTH EVERY DAY 06/20/20  Yes [name], [name], MD  losartan (COZAAR) 50 MG tablet Take 1 tablet (50 mg total) by  mouth daily. 01/17/21  Yes Chandrasekhar, Mahesh A, MD  montelukast (SINGULAIR) 10 MG tablet Take 10 mg  by mouth daily. 03/22/21  Yes [provider]  nitroGLYCERIN (NITROSTAT) 0.4 MG SL tablet Place 1 tablet (0.4 mg total) under the tongue every 5 (five) minutes as needed for chest pain. 05/24/21  Yes Chandrasekhar, Mahesh A, MD  nystatin (MYCOSTATIN/NYSTOP) powder APPLY 1 APPLICATION        TOPICALLY 3 TIMES A DAY Patient taking differently: Apply 1 application topically daily as needed (psoriasis). 03/07/21  Yes Isaac Bliss, Rayford Halsted, MD  OXYGEN Inhale 2 L into the lungs continuous.   Yes [provider]  pantoprazole (PROTONIX) 40 MG tablet Take 1 tablet (40 mg total) by mouth daily before breakfast. 01/10/21  Yes Tanda Rockers, MD  pregabalin (LYRICA) 50 MG capsule Take 50 mg by mouth at bedtime. 03/13/21  Yes [provider]  torsemide (DEMADEX) 20 MG tablet Take 1 tablet (20 mg total) by mouth 2 (two) times daily. 06/21/21  Yes Mariel Aloe, MD  traMADol (ULTRAM) 50 MG tablet Take 50 mg by mouth 2 (two) times daily as needed for moderate pain. 06/26/21  Yes [provider]  triamcinolone cream (KENALOG) 0.1 % Apply 1 application topically 2 (two) times daily as needed (for psoriasis).    Yes [provider]  umeclidinium bromide (INCRUSE ELLIPTA) 62.5 MCG/INH AEPB Inhale 1 puff into the lungs daily. 03/14/21  Yes Tanda Rockers, MD  vitamin B-12 (CYANOCOBALAMIN) 1000 MCG tablet Take 1,000 mcg by mouth daily.   Yes [provider]  clotrimazole-betamethasone (LOTRISONE) cream Apply 1 application topically 2 (two) times daily as needed. Patient not taking: No sig reported 05/29/21   [place]ique, Betty G, MD  diltiazem (CARDIZEM CD) 240 MG 24 hr capsule Take 1 capsule (240 mg total) by mouth daily. 09/11/21   Chandrasekhar, Terisa Starr, MD  doxycycline (VIBRA-TABS) 100 MG tablet Take 1 tablet (100 mg total) by mouth 2 (two) times daily. 07/25/21   Sherrilyn Rist A, MD  DULoxetine 40 MG CPEP Take 40 mg by mouth daily. Patient not taking: No sig  reported 06/27/21   Isaac Bliss, Rayford Halsted, MD  Magnesium Oxide 400 MG CAPS Take 1 capsule (400 mg total) by mouth daily. Patient not taking: No sig reported 06/06/21   Werner Lean, MD    Allergies  Allergen Reactions   Gabapentin Other (See Comments)    Dizziness, lighthead    Social History   Socioeconomic History   Marital status: Widowed    Spouse name: Not on file   Number of children: Not on file   Years of education: Not on file   Highest education level: Not on file  Occupational History   Occupation: retired    Fish farm manager: [employer]  Tobacco Use   Smoking status: Former    Packs/day: 1.50    Years: 58.50    Pack years: 87.75    Types: Cigarettes    Quit date: 03/03/2005    Years since quitting: 16.5   Smokeless tobacco: Never  Vaping Use   Vaping Use: Never used  Substance and Sexual Activity   Alcohol use: No   Drug use: No   Sexual activity: Not Currently  Other Topics Concern   Not on file  Social History Narrative   Not on file   Social Determinants of Health   Financial Resource Strain: Medium Risk   Difficulty of Paying Living Expenses: Somewhat hard  Food  Insecurity: Not on file  Transportation Needs: Not on file  Physical Activity: Not on file  Stress: Not on file  Social Connections: Not on file  Intimate Partner Violence: Not on file    Tobacco Use: Medium Risk   Smoking Tobacco Use: Former   Smokeless Tobacco Use: Never   Passive Exposure: Not on file   Social History   Substance and Sexual Activity  Alcohol Use No    Family History  Problem Relation Age of Onset   Breast cancer Mother    Coronary artery disease Father    Rheum arthritis Father     ROS: Constitutional: no fever, no chills, no night sweats, no significant weight loss Cardiovascular: no chest pain, no palpitations Respiratory: no cough, no shortness of breath, patient has COPD Gastrointestinal: no vomiting, no nausea Musculoskeletal: no  swelling in Joints, Joint Pain Neurologic: no numbness, no tingling, no difficulty with balance    Objective:  Physical Exam: Well nourished and well developed.  General: Alert and oriented x3, cooperative and pleasant, no acute distress.   The patient is oxygen dependent with nasal cannula in place.  Head: normocephalic, atraumatic, neck supple.  Eyes: EOMI.  Respiratory: breath sounds clear in all fields, no wheezing, rales, or rhonchi. Cardiovascular: Regular rate and rhythm, no murmurs, gallops or rubs.  Abdomen: non-tender to palpation and soft, normoactive bowel sounds. Musculoskeletal:   Right Hip Exam:  The range of motion: normal without discomfort.   Left Hip Exam:  The range of motion: Flexion to 90 degrees, Internal Rotation to 20 degrees, External Rotation to 20 degrees, and abduction to 20 degrees without discomfort. Any attempted hip flexion causes significant pain.  There is positive tenderness over the greater trochanteric bursa.   Lumbar Spine Exam:  Positive tenderness in the lumbar paraspinals but no direct tenderness over the bone.  Calves soft and nontender. Motor function intact in LE. Strength 5/5 LE bilaterally. Neuro: Distal pulses 2+. Sensation to light touch intact in LE.  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    These x-rays were ordered, reviewed, and interpreted independently and were discussed with the patient.      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 She has significant increased enhancement over the greater trochanter, but not in the intertrochanteric area. To me, this is not consistent with a fracture.   Assessment/Plan:  End stage  arthritis, left hip  The patient history, physical examination, clinical judgement of the provider and imaging studies are consistent with end stage degenerative joint disease of the left hip and total hip arthroplasty is deemed medically necessary. The treatment options including medical management, injection therapy, arthroscopy and arthroplasty were discussed at length. The risks and benefits of total hip arthroplasty were presented and reviewed. The risks due to aseptic loosening, infection, stiffness, dislocation/subluxation, thromboembolic complications and other imponderables were discussed. The patient acknowledged the explanation, agreed to proceed with the plan and consent was signed. Patient is being admitted for inpatient treatment for surgery, pain control, PT, OT, prophylactic antibiotics, VTE prophylaxis, progressive ambulation and ADLs and discharge planning.The patient is planning to be discharged  home .  Anticipated LOS equal to or greater than 2 midnights due to - Age 43  and older with one or more of the following:  - Obesity  - Expected need for hospital services (PT, OT, Nursing) required for safe  discharge  - Anticipated need for postoperative skilled nursing care or inpatient rehab  - Active co-morbidities: Respiratory Failure/COPD OR   - Unanticipated findings during/Post Surgery: None  - Patient is a high risk of re-admission due to: None  Therapy Plans: HHPT Disposition: Home with Daughter Planned DVT Prophylaxis: Eliquis 5mg  BID DME Needed: None PCP: , MD  Cardiologist: Chaya Jan, MD (clearance received) Pulmonologist: [facility]ert (clearance received) TXA: IV Allergies: Gabapentin (itch/breakout) Anesthesia Concerns: (see notable medical history) BMI: 41 Last HgbA1c: N/A  [facility] [address] [facility] [place]  Other: Notable Medical History:  - Hx of Barrett esophagus - Hx of CAD in native artery - NSTEMI (05/2015) - s/p  emergent CABG - Congestive HF - CKD Stage 3a - Chronic respiratory failure (HCC) - Oxygen dependent since 06/2010 - Hx of ARDS (2006) - Intracranial Hemorrhage (2019) - Carotid Artery Disease - Pulmonary Fibrosis - Hx of Coronary Artery Bypass Graft  - Patient was instructed on what medications to stop prior to surgery. - Follow-up visit in 2 weeks with Dr. 05-11-1999 - Begin physical therapy following surgery - Pre-operative lab work as pre-surgical testing - Prescriptions will be provided in hospital at time of discharge  Lequita Halt, [facility], PA-C Orthopedic Surgery [facility] [place]

## 2021-09-16 ENCOUNTER — Observation Stay (HOSPITAL_COMMUNITY): Payer: Medicare Other

## 2021-09-16 ENCOUNTER — Inpatient Hospital Stay (HOSPITAL_COMMUNITY)
Admission: RE | Admit: 2021-09-16 | Discharge: 2021-09-23 | DRG: 470 | Disposition: A | Discharge status: Home / Self Care | Payer: Medicare Other | Attending: Orthopedic Surgery | Admitting: Orthopedic Surgery

## 2021-09-16 ENCOUNTER — Encounter (HOSPITAL_COMMUNITY): Payer: Self-pay | Admitting: Orthopedic Surgery

## 2021-09-16 ENCOUNTER — Encounter (HOSPITAL_COMMUNITY)
Admission: RE | Disposition: A | Discharge status: Home / Self Care | Payer: Self-pay | Source: Home / Self Care | Attending: Orthopedic Surgery

## 2021-09-16 ENCOUNTER — Ambulatory Visit (HOSPITAL_COMMUNITY): Payer: Medicare Other | Admitting: Certified Registered Nurse Anesthetist

## 2021-09-16 ENCOUNTER — Ambulatory Visit (HOSPITAL_COMMUNITY): Payer: Medicare Other

## 2021-09-16 ENCOUNTER — Ambulatory Visit (HOSPITAL_COMMUNITY): Payer: Medicare Other | Admitting: Physician Assistant

## 2021-09-16 DIAGNOSIS — J9611 Chronic respiratory failure with hypoxia: Secondary | ICD-10-CM | POA: Diagnosis present

## 2021-09-16 DIAGNOSIS — J9612 Chronic respiratory failure with hypercapnia: Secondary | ICD-10-CM | POA: Diagnosis present

## 2021-09-16 DIAGNOSIS — Z888 Allergy status to other drugs, medicaments and biological substances status: Secondary | ICD-10-CM

## 2021-09-16 DIAGNOSIS — I5032 Chronic diastolic (congestive) heart failure: Secondary | ICD-10-CM | POA: Diagnosis present

## 2021-09-16 DIAGNOSIS — Z96649 Presence of unspecified artificial hip joint: Secondary | ICD-10-CM

## 2021-09-16 DIAGNOSIS — I4892 Unspecified atrial flutter: Secondary | ICD-10-CM | POA: Diagnosis present

## 2021-09-16 DIAGNOSIS — Z8249 Family history of ischemic heart disease and other diseases of the circulatory system: Secondary | ICD-10-CM

## 2021-09-16 DIAGNOSIS — I252 Old myocardial infarction: Secondary | ICD-10-CM

## 2021-09-16 DIAGNOSIS — Z8261 Family history of arthritis: Secondary | ICD-10-CM

## 2021-09-16 DIAGNOSIS — E039 Hypothyroidism, unspecified: Secondary | ICD-10-CM | POA: Diagnosis present

## 2021-09-16 DIAGNOSIS — Z9181 History of falling: Secondary | ICD-10-CM

## 2021-09-16 DIAGNOSIS — Z951 Presence of aortocoronary bypass graft: Secondary | ICD-10-CM

## 2021-09-16 DIAGNOSIS — K2270 Barrett's esophagus without dysplasia: Secondary | ICD-10-CM | POA: Diagnosis present

## 2021-09-16 DIAGNOSIS — Z96642 Presence of left artificial hip joint: Secondary | ICD-10-CM | POA: Diagnosis not present

## 2021-09-16 DIAGNOSIS — L4050 Arthropathic psoriasis, unspecified: Secondary | ICD-10-CM | POA: Diagnosis present

## 2021-09-16 DIAGNOSIS — Z471 Aftercare following joint replacement surgery: Secondary | ICD-10-CM | POA: Diagnosis not present

## 2021-09-16 DIAGNOSIS — D62 Acute posthemorrhagic anemia: Secondary | ICD-10-CM | POA: Diagnosis not present

## 2021-09-16 DIAGNOSIS — Z419 Encounter for procedure for purposes other than remedying health state, unspecified: Secondary | ICD-10-CM

## 2021-09-16 DIAGNOSIS — M25752 Osteophyte, left hip: Secondary | ICD-10-CM | POA: Diagnosis present

## 2021-09-16 DIAGNOSIS — E785 Hyperlipidemia, unspecified: Secondary | ICD-10-CM | POA: Diagnosis present

## 2021-09-16 DIAGNOSIS — J449 Chronic obstructive pulmonary disease, unspecified: Secondary | ICD-10-CM | POA: Diagnosis not present

## 2021-09-16 DIAGNOSIS — I4819 Other persistent atrial fibrillation: Secondary | ICD-10-CM | POA: Diagnosis present

## 2021-09-16 DIAGNOSIS — Z66 Do not resuscitate: Secondary | ICD-10-CM | POA: Diagnosis not present

## 2021-09-16 DIAGNOSIS — Z79890 Hormone replacement therapy (postmenopausal): Secondary | ICD-10-CM

## 2021-09-16 DIAGNOSIS — N1832 Chronic kidney disease, stage 3b: Secondary | ICD-10-CM | POA: Diagnosis present

## 2021-09-16 DIAGNOSIS — I2510 Atherosclerotic heart disease of native coronary artery without angina pectoris: Secondary | ICD-10-CM | POA: Diagnosis present

## 2021-09-16 DIAGNOSIS — Z6841 Body Mass Index (BMI) 40.0 and over, adult: Secondary | ICD-10-CM | POA: Diagnosis not present

## 2021-09-16 DIAGNOSIS — M1612 Unilateral primary osteoarthritis, left hip: Principal | ICD-10-CM | POA: Diagnosis present

## 2021-09-16 DIAGNOSIS — Z993 Dependence on wheelchair: Secondary | ICD-10-CM

## 2021-09-16 DIAGNOSIS — M169 Osteoarthritis of hip, unspecified: Secondary | ICD-10-CM | POA: Diagnosis present

## 2021-09-16 DIAGNOSIS — Z7951 Long term (current) use of inhaled steroids: Secondary | ICD-10-CM

## 2021-09-16 DIAGNOSIS — Z9981 Dependence on supplemental oxygen: Secondary | ICD-10-CM

## 2021-09-16 DIAGNOSIS — J849 Interstitial pulmonary disease, unspecified: Secondary | ICD-10-CM | POA: Diagnosis present

## 2021-09-16 DIAGNOSIS — I130 Hypertensive heart and chronic kidney disease with heart failure and stage 1 through stage 4 chronic kidney disease, or unspecified chronic kidney disease: Secondary | ICD-10-CM | POA: Diagnosis not present

## 2021-09-16 DIAGNOSIS — Z87891 Personal history of nicotine dependence: Secondary | ICD-10-CM

## 2021-09-16 DIAGNOSIS — Z7901 Long term (current) use of anticoagulants: Secondary | ICD-10-CM

## 2021-09-16 DIAGNOSIS — Z79899 Other long term (current) drug therapy: Secondary | ICD-10-CM

## 2021-09-16 DIAGNOSIS — Z8701 Personal history of pneumonia (recurrent): Secondary | ICD-10-CM

## 2021-09-16 DIAGNOSIS — J8410 Pulmonary fibrosis, unspecified: Secondary | ICD-10-CM | POA: Diagnosis present

## 2021-09-16 DIAGNOSIS — I480 Paroxysmal atrial fibrillation: Secondary | ICD-10-CM | POA: Diagnosis not present

## 2021-09-16 DIAGNOSIS — K219 Gastro-esophageal reflux disease without esophagitis: Secondary | ICD-10-CM | POA: Diagnosis present

## 2021-09-16 HISTORY — PX: TOTAL HIP ARTHROPLASTY: SHX124

## 2021-09-16 SURGERY — ARTHROPLASTY, HIP, TOTAL, ANTERIOR APPROACH
Anesthesia: Spinal | Site: Hip | Laterality: Left

## 2021-09-16 MED ORDER — DOFETILIDE 250 MCG PO CAPS
250.0000 ug | ORAL_CAPSULE | Freq: Two times a day (BID) | ORAL | Status: DC
  Administered 2021-09-16 – 2021-09-23 (×14): 250 ug via ORAL
  Filled 2021-09-16 (×16): qty 1

## 2021-09-16 MED ORDER — DILTIAZEM HCL ER COATED BEADS 240 MG PO CP24
240.0000 mg | ORAL_CAPSULE | Freq: Every day | ORAL | Status: DC
  Administered 2021-09-17 – 2021-09-23 (×7): 240 mg via ORAL
  Filled 2021-09-16 (×7): qty 1

## 2021-09-16 MED ORDER — HYDROCODONE-ACETAMINOPHEN 7.5-325 MG PO TABS
1.0000 | ORAL_TABLET | ORAL | Status: DC | PRN
  Administered 2021-09-16 – 2021-09-17 (×2): 1 via ORAL
  Administered 2021-09-18 – 2021-09-19 (×2): 2 via ORAL
  Filled 2021-09-16: qty 2
  Filled 2021-09-16 (×2): qty 1
  Filled 2021-09-16: qty 2

## 2021-09-16 MED ORDER — TRANEXAMIC ACID-NACL 1000-0.7 MG/100ML-% IV SOLN
1000.0000 mg | INTRAVENOUS | Status: AC
  Administered 2021-09-16: 1000 mg via INTRAVENOUS
  Filled 2021-09-16: qty 100

## 2021-09-16 MED ORDER — MIDAZOLAM HCL 2 MG/2ML IJ SOLN: 1.0000 mg | INTRAMUSCULAR | Status: DC

## 2021-09-16 MED ORDER — PHENYLEPHRINE HCL-NACL 20-0.9 MG/250ML-% IV SOLN
INTRAVENOUS | Status: DC | PRN
  Administered 2021-09-16: 25 ug/min via INTRAVENOUS

## 2021-09-16 MED ORDER — BISACODYL 10 MG RE SUPP: 10.0000 mg | Freq: Every day | RECTAL | Status: DC | PRN

## 2021-09-16 MED ORDER — ONDANSETRON HCL 4 MG/2ML IJ SOLN: 4.0000 mg | Freq: Four times a day (QID) | INTRAMUSCULAR | Status: DC | PRN

## 2021-09-16 MED ORDER — FENTANYL CITRATE PF 50 MCG/ML IJ SOSY: 25.0000 ug | PREFILLED_SYRINGE | INTRAMUSCULAR | Status: DC | PRN

## 2021-09-16 MED ORDER — OXYCODONE HCL 5 MG/5ML PO SOLN: 5.0000 mg | Freq: Once | ORAL | Status: DC | PRN

## 2021-09-16 MED ORDER — MENTHOL 3 MG MT LOZG: 1.0000 | LOZENGE | OROMUCOSAL | Status: DC | PRN

## 2021-09-16 MED ORDER — DOCUSATE SODIUM 100 MG PO CAPS
100.0000 mg | ORAL_CAPSULE | Freq: Two times a day (BID) | ORAL | Status: DC
  Administered 2021-09-16 – 2021-09-23 (×14): 100 mg via ORAL
  Filled 2021-09-16 (×14): qty 1

## 2021-09-16 MED ORDER — LEFLUNOMIDE 20 MG PO TABS
20.0000 mg | ORAL_TABLET | Freq: Every day | ORAL | Status: DC
  Administered 2021-09-17 – 2021-09-23 (×7): 20 mg via ORAL
  Filled 2021-09-16 (×7): qty 1

## 2021-09-16 MED ORDER — ATORVASTATIN CALCIUM 40 MG PO TABS
40.0000 mg | ORAL_TABLET | Freq: Every day | ORAL | Status: DC
  Administered 2021-09-17 – 2021-09-23 (×7): 40 mg via ORAL
  Filled 2021-09-16 (×7): qty 1

## 2021-09-16 MED ORDER — DEXAMETHASONE SODIUM PHOSPHATE 10 MG/ML IJ SOLN
INTRAMUSCULAR | Status: DC | PRN
  Administered 2021-09-16: 10 mg via INTRAVENOUS

## 2021-09-16 MED ORDER — DULOXETINE HCL 30 MG PO CPEP
30.0000 mg | ORAL_CAPSULE | Freq: Every day | ORAL | Status: DC
  Administered 2021-09-17 – 2021-09-23 (×7): 30 mg via ORAL
  Filled 2021-09-16 (×7): qty 1

## 2021-09-16 MED ORDER — ONDANSETRON HCL 4 MG PO TABS
4.0000 mg | ORAL_TABLET | Freq: Four times a day (QID) | ORAL | Status: DC | PRN
  Administered 2021-09-17: 4 mg via ORAL
  Filled 2021-09-16: qty 1

## 2021-09-16 MED ORDER — WATER FOR IRRIGATION, STERILE IR SOLN
Status: DC | PRN
  Administered 2021-09-16: 2000 mL

## 2021-09-16 MED ORDER — PREGABALIN 50 MG PO CAPS
50.0000 mg | ORAL_CAPSULE | Freq: Every day | ORAL | Status: DC
  Administered 2021-09-16 – 2021-09-22 (×7): 50 mg via ORAL
  Filled 2021-09-16 (×7): qty 1

## 2021-09-16 MED ORDER — NITROGLYCERIN 0.4 MG SL SUBL: 0.4000 mg | SUBLINGUAL_TABLET | SUBLINGUAL | Status: DC | PRN

## 2021-09-16 MED ORDER — FENTANYL CITRATE (PF) 100 MCG/2ML IJ SOLN
INTRAMUSCULAR | Status: DC | PRN
  Administered 2021-09-16: 50 ug via INTRAVENOUS

## 2021-09-16 MED ORDER — FERROUS SULFATE 325 (65 FE) MG PO TABS
325.0000 mg | ORAL_TABLET | Freq: Every day | ORAL | Status: DC
  Administered 2021-09-17 – 2021-09-23 (×7): 325 mg via ORAL
  Filled 2021-09-16 (×7): qty 1

## 2021-09-16 MED ORDER — ARFORMOTEROL TARTRATE 15 MCG/2ML IN NEBU
15.0000 ug | INHALATION_SOLUTION | Freq: Two times a day (BID) | RESPIRATORY_TRACT | Status: DC
  Administered 2021-09-16 – 2021-09-23 (×13): 15 ug via RESPIRATORY_TRACT
  Filled 2021-09-16 (×13): qty 2

## 2021-09-16 MED ORDER — FENTANYL CITRATE PF 50 MCG/ML IJ SOSY: 50.0000 ug | PREFILLED_SYRINGE | INTRAMUSCULAR | Status: DC

## 2021-09-16 MED ORDER — MORPHINE SULFATE (PF) 2 MG/ML IV SOLN: 0.5000 mg | INTRAVENOUS | Status: DC | PRN

## 2021-09-16 MED ORDER — POVIDONE-IODINE 10 % EX SWAB
2.0000 "application " | Freq: Once | CUTANEOUS | Status: AC
  Administered 2021-09-16: 2 via TOPICAL

## 2021-09-16 MED ORDER — DEXAMETHASONE SODIUM PHOSPHATE 10 MG/ML IJ SOLN
10.0000 mg | Freq: Once | INTRAMUSCULAR | Status: AC
  Administered 2021-09-17: 10 mg via INTRAVENOUS
  Filled 2021-09-16: qty 1

## 2021-09-16 MED ORDER — BUDESONIDE 0.25 MG/2ML IN SUSP
0.2500 mg | Freq: Two times a day (BID) | RESPIRATORY_TRACT | Status: DC
  Administered 2021-09-16 – 2021-09-23 (×13): 0.25 mg via RESPIRATORY_TRACT
  Filled 2021-09-16 (×13): qty 2

## 2021-09-16 MED ORDER — ACETAMINOPHEN 10 MG/ML IV SOLN: 1000.0000 mg | Freq: Once | INTRAVENOUS | Status: DC | PRN

## 2021-09-16 MED ORDER — METOCLOPRAMIDE HCL 5 MG/ML IJ SOLN: 5.0000 mg | Freq: Three times a day (TID) | INTRAMUSCULAR | Status: DC | PRN

## 2021-09-16 MED ORDER — TRAMADOL HCL 50 MG PO TABS
50.0000 mg | ORAL_TABLET | Freq: Two times a day (BID) | ORAL | Status: DC | PRN
  Administered 2021-09-16 – 2021-09-23 (×8): 50 mg via ORAL
  Filled 2021-09-16 (×8): qty 1

## 2021-09-16 MED ORDER — BUPIVACAINE HCL 0.25 % IJ SOLN
INTRAMUSCULAR | Status: DC | PRN
  Administered 2021-09-16: 30 mL

## 2021-09-16 MED ORDER — ONDANSETRON HCL 4 MG/2ML IJ SOLN
INTRAMUSCULAR | Status: DC | PRN
  Administered 2021-09-16: 4 mg via INTRAVENOUS

## 2021-09-16 MED ORDER — PHENYLEPHRINE 40 MCG/ML (10ML) SYRINGE FOR IV PUSH (FOR BLOOD PRESSURE SUPPORT)
PREFILLED_SYRINGE | INTRAVENOUS | Status: DC | PRN
  Administered 2021-09-16: 120 ug via INTRAVENOUS
  Administered 2021-09-16: 80 ug via INTRAVENOUS

## 2021-09-16 MED ORDER — LOSARTAN POTASSIUM 50 MG PO TABS
50.0000 mg | ORAL_TABLET | Freq: Every day | ORAL | Status: DC
  Administered 2021-09-17 – 2021-09-23 (×7): 50 mg via ORAL
  Filled 2021-09-16 (×7): qty 1

## 2021-09-16 MED ORDER — PHENYLEPHRINE HCL (PRESSORS) 10 MG/ML IV SOLN
INTRAVENOUS | Status: AC
  Filled 2021-09-16: qty 1

## 2021-09-16 MED ORDER — PANTOPRAZOLE SODIUM 40 MG PO TBEC
40.0000 mg | DELAYED_RELEASE_TABLET | Freq: Every day | ORAL | Status: DC
  Administered 2021-09-17 – 2021-09-23 (×7): 40 mg via ORAL
  Filled 2021-09-16 (×7): qty 1

## 2021-09-16 MED ORDER — DEXAMETHASONE SODIUM PHOSPHATE 10 MG/ML IJ SOLN: 8.0000 mg | Freq: Once | INTRAMUSCULAR | Status: DC

## 2021-09-16 MED ORDER — ACETAMINOPHEN 325 MG PO TABS
325.0000 mg | ORAL_TABLET | Freq: Four times a day (QID) | ORAL | Status: DC | PRN
  Administered 2021-09-18 – 2021-09-23 (×2): 650 mg via ORAL
  Filled 2021-09-16 (×2): qty 2

## 2021-09-16 MED ORDER — APIXABAN 2.5 MG PO TABS
2.5000 mg | ORAL_TABLET | Freq: Two times a day (BID) | ORAL | Status: DC
  Administered 2021-09-17 – 2021-09-23 (×13): 2.5 mg via ORAL
  Filled 2021-09-16 (×13): qty 1

## 2021-09-16 MED ORDER — LEVOTHYROXINE SODIUM 125 MCG PO TABS
125.0000 ug | ORAL_TABLET | Freq: Every day | ORAL | Status: DC
  Administered 2021-09-17 – 2021-09-23 (×7): 125 ug via ORAL
  Filled 2021-09-16 (×7): qty 1

## 2021-09-16 MED ORDER — LACTATED RINGERS IV SOLN: INTRAVENOUS | Status: DC

## 2021-09-16 MED ORDER — 0.9 % SODIUM CHLORIDE (POUR BTL) OPTIME
TOPICAL | Status: DC | PRN
  Administered 2021-09-16: 1000 mL

## 2021-09-16 MED ORDER — CEFAZOLIN SODIUM-DEXTROSE 2-4 GM/100ML-% IV SOLN
2.0000 g | Freq: Four times a day (QID) | INTRAVENOUS | Status: AC
  Administered 2021-09-16 – 2021-09-17 (×2): 2 g via INTRAVENOUS
  Filled 2021-09-16 (×2): qty 100

## 2021-09-16 MED ORDER — ACETAMINOPHEN 10 MG/ML IV SOLN
1000.0000 mg | Freq: Four times a day (QID) | INTRAVENOUS | Status: DC
  Administered 2021-09-16: 1000 mg via INTRAVENOUS
  Filled 2021-09-16: qty 100

## 2021-09-16 MED ORDER — PROPOFOL 500 MG/50ML IV EMUL
INTRAVENOUS | Status: DC | PRN
  Administered 2021-09-16: 30 ug/kg/min via INTRAVENOUS

## 2021-09-16 MED ORDER — HYDROCODONE-ACETAMINOPHEN 5-325 MG PO TABS
1.0000 | ORAL_TABLET | ORAL | Status: DC | PRN
  Administered 2021-09-17 (×2): 2 via ORAL
  Administered 2021-09-21: 1 via ORAL
  Filled 2021-09-16 (×2): qty 2
  Filled 2021-09-16: qty 1

## 2021-09-16 MED ORDER — POLYETHYLENE GLYCOL 3350 17 G PO PACK: 17.0000 g | PACK | Freq: Every day | ORAL | Status: DC | PRN

## 2021-09-16 MED ORDER — DOXYCYCLINE HYCLATE 100 MG PO TABS: 100.0000 mg | ORAL_TABLET | Freq: Two times a day (BID) | ORAL | Status: DC

## 2021-09-16 MED ORDER — PHENOL 1.4 % MT LIQD: 1.0000 | OROMUCOSAL | Status: DC | PRN

## 2021-09-16 MED ORDER — ONDANSETRON HCL 4 MG/2ML IJ SOLN: 4.0000 mg | Freq: Once | INTRAMUSCULAR | Status: DC | PRN

## 2021-09-16 MED ORDER — CEFAZOLIN SODIUM-DEXTROSE 2-4 GM/100ML-% IV SOLN
2.0000 g | INTRAVENOUS | Status: AC
  Administered 2021-09-16: 2 g via INTRAVENOUS
  Filled 2021-09-16: qty 100

## 2021-09-16 MED ORDER — TORSEMIDE 20 MG PO TABS
20.0000 mg | ORAL_TABLET | Freq: Two times a day (BID) | ORAL | Status: DC
  Administered 2021-09-17 – 2021-09-23 (×12): 20 mg via ORAL
  Filled 2021-09-16 (×15): qty 1

## 2021-09-16 MED ORDER — OXYCODONE HCL 5 MG PO TABS: 5.0000 mg | ORAL_TABLET | Freq: Once | ORAL | Status: DC | PRN

## 2021-09-16 MED ORDER — ORAL CARE MOUTH RINSE: 15.0000 mL | Freq: Once | OROMUCOSAL | Status: AC

## 2021-09-16 MED ORDER — UMECLIDINIUM BROMIDE 62.5 MCG/ACT IN AEPB
1.0000 | INHALATION_SPRAY | Freq: Every day | RESPIRATORY_TRACT | Status: DC
  Administered 2021-09-19 – 2021-09-23 (×5): 1 via RESPIRATORY_TRACT
  Filled 2021-09-16: qty 7

## 2021-09-16 MED ORDER — ALBUTEROL SULFATE (2.5 MG/3ML) 0.083% IN NEBU
2.5000 mg | INHALATION_SOLUTION | RESPIRATORY_TRACT | Status: DC | PRN
  Administered 2021-09-21: 2.5 mg via RESPIRATORY_TRACT
  Filled 2021-09-16: qty 3

## 2021-09-16 MED ORDER — EPHEDRINE SULFATE-NACL 50-0.9 MG/10ML-% IV SOSY
PREFILLED_SYRINGE | INTRAVENOUS | Status: DC | PRN
  Administered 2021-09-16: 10 mg via INTRAVENOUS

## 2021-09-16 MED ORDER — SODIUM CHLORIDE 0.9 % IV SOLN: INTRAVENOUS | Status: DC

## 2021-09-16 MED ORDER — BUPIVACAINE IN DEXTROSE 0.75-8.25 % IT SOLN
INTRATHECAL | Status: DC | PRN
  Administered 2021-09-16: 1.6 mL via INTRATHECAL

## 2021-09-16 MED ORDER — FENTANYL CITRATE (PF) 100 MCG/2ML IJ SOLN
INTRAMUSCULAR | Status: AC
  Filled 2021-09-16: qty 2

## 2021-09-16 MED ORDER — VANCOMYCIN HCL IN DEXTROSE 1-5 GM/200ML-% IV SOLN
1000.0000 mg | Freq: Once | INTRAVENOUS | Status: AC
  Administered 2021-09-16: 1000 mg via INTRAVENOUS
  Filled 2021-09-16: qty 200

## 2021-09-16 MED ORDER — CHLORHEXIDINE GLUCONATE 0.12 % MT SOLN
15.0000 mL | Freq: Once | OROMUCOSAL | Status: AC
  Administered 2021-09-16: 15 mL via OROMUCOSAL

## 2021-09-16 MED ORDER — CYCLOBENZAPRINE HCL 5 MG PO TABS
5.0000 mg | ORAL_TABLET | Freq: Three times a day (TID) | ORAL | Status: DC | PRN
  Administered 2021-09-16 – 2021-09-22 (×2): 5 mg via ORAL
  Filled 2021-09-16 (×2): qty 1

## 2021-09-16 MED ORDER — METOCLOPRAMIDE HCL 5 MG PO TABS: 5.0000 mg | ORAL_TABLET | Freq: Three times a day (TID) | ORAL | Status: DC | PRN

## 2021-09-16 SURGICAL SUPPLY — 45 items
BAG COUNTER SPONGE SURGICOUNT (BAG) IMPLANT
BAG DECANTER FOR FLEXI CONT (MISCELLANEOUS) IMPLANT
BAG SPEC THK2 15X12 ZIP CLS (MISCELLANEOUS)
BAG SPNG CNTER NS LX DISP (BAG)
BAG ZIPLOCK 12X15 (MISCELLANEOUS) IMPLANT
BLADE SAG 18X100X1.27 (BLADE) ×2 IMPLANT
COVER PERINEAL POST (MISCELLANEOUS) ×2 IMPLANT
COVER SURGICAL LIGHT HANDLE (MISCELLANEOUS) ×2 IMPLANT
CUP ACET PINNACLE SECTR 50MM (Hips) ×1 IMPLANT
DECANTER SPIKE VIAL GLASS SM (MISCELLANEOUS) ×2 IMPLANT
DRAPE FOOT SWITCH (DRAPES) ×2 IMPLANT
DRAPE STERI IOBAN 125X83 (DRAPES) ×2 IMPLANT
DRAPE U-SHAPE 47X51 STRL (DRAPES) ×4 IMPLANT
DRSG AQUACEL AG ADV 3.5X10 (GAUZE/BANDAGES/DRESSINGS) ×2 IMPLANT
DURAPREP 26ML APPLICATOR (WOUND CARE) ×2 IMPLANT
ELECT REM PT RETURN 15FT ADLT (MISCELLANEOUS) ×2 IMPLANT
GLOVE SRG 8 PF TXTR STRL LF DI (GLOVE) ×1 IMPLANT
GLOVE SURG ENC MOIS LTX SZ6.5 (GLOVE) ×2 IMPLANT
GLOVE SURG ENC MOIS LTX SZ7 (GLOVE) ×2 IMPLANT
GLOVE SURG ENC MOIS LTX SZ8 (GLOVE) ×4 IMPLANT
GLOVE SURG UNDER POLY LF SZ7 (GLOVE) ×2 IMPLANT
GLOVE SURG UNDER POLY LF SZ8 (GLOVE) ×2
GLOVE SURG UNDER POLY LF SZ8.5 (GLOVE) IMPLANT
GOWN STRL REUS W/TWL LRG LVL3 (GOWN DISPOSABLE) ×4 IMPLANT
GOWN STRL REUS W/TWL XL LVL3 (GOWN DISPOSABLE) IMPLANT
HEAD FEM STD 32X+1 STRL (Hips) ×2 IMPLANT
HOLDER FOLEY CATH W/STRAP (MISCELLANEOUS) ×2 IMPLANT
KIT TURNOVER KIT A (KITS) IMPLANT
LINER MARATHON 32 50 (Hips) ×2 IMPLANT
MANIFOLD NEPTUNE II (INSTRUMENTS) ×2 IMPLANT
PACK ANTERIOR HIP CUSTOM (KITS) ×2 IMPLANT
PENCIL SMOKE EVACUATOR COATED (MISCELLANEOUS) ×2 IMPLANT
PINNACLE SECTOR CUP 50MM (Hips) ×2 IMPLANT
STEM FEMORAL SZ 6MM STD ACTIS (Stem) ×2 IMPLANT
STRIP CLOSURE SKIN 1/2X4 (GAUZE/BANDAGES/DRESSINGS) ×2 IMPLANT
SUT ETHIBOND NAB CT1 #1 30IN (SUTURE) ×2 IMPLANT
SUT MNCRL AB 4-0 PS2 18 (SUTURE) ×2 IMPLANT
SUT STRATAFIX 0 PDS 27 VIOLET (SUTURE) ×2
SUT VIC AB 2-0 CT1 27 (SUTURE) ×4
SUT VIC AB 2-0 CT1 TAPERPNT 27 (SUTURE) ×2 IMPLANT
SUTURE STRATFX 0 PDS 27 VIOLET (SUTURE) ×1 IMPLANT
SYR 50ML LL SCALE MARK (SYRINGE) IMPLANT
TAPE STRIPS DRAPE STRL (GAUZE/BANDAGES/DRESSINGS) ×2 IMPLANT
TRAY FOLEY MTR SLVR 16FR STAT (SET/KITS/TRAYS/PACK) ×2 IMPLANT
TUBE SUCTION HIGH CAP CLEAR NV (SUCTIONS) ×2 IMPLANT

## 2021-09-16 NOTE — Discharge Instructions (Signed)
°Frank [name], MD °Total Joint Specialist °[facility] °[address] °[place], [place] [address] °[phone] ° °ANTERIOR APPROACH TOTAL HIP REPLACEMENT POSTOPERATIVE DIRECTIONS ° ° ° ° °Hip Rehabilitation, Guidelines Following Surgery  °The results of a hip operation are greatly improved after range of motion and muscle strengthening exercises. Follow all safety measures which are given to protect your hip. If any of these exercises cause increased pain or swelling in your joint, decrease the amount until you are comfortable again. Then slowly increase the exercises. Call your caregiver if you have problems or questions.  ° °HOME CARE INSTRUCTIONS  °Remove items at home which could result in a fall. This includes throw rugs or furniture in walking pathways.  °ICE to the affected hip as frequently as 20-30 minutes an hour and then as needed for pain and swelling. Continue to use ice on the hip for pain and swelling from surgery. You may notice swelling that will progress down to the foot and ankle. This is normal after surgery. Elevate the leg when you are not up walking on it.   °Continue to use the breathing machine which will help keep your temperature down.  It is common for your temperature to cycle up and down following surgery, especially at night when you are not up moving around and exerting yourself.  The breathing machine keeps your lungs expanded and your temperature down. ° °DIET °You may resume your previous home diet once your are discharged from the hospital. ° °DRESSING / WOUND CARE / SHOWERING °You have an adhesive waterproof bandage over the incision. Leave this in place until your first follow-up appointment. Once you remove this you will not need to place another bandage.  °You may begin showering 3 days following surgery, but do not submerge the incision under water. ° °ACTIVITY °For the first 3-5 days, it is important to rest and keep the operative leg elevated.  You should, as a general rule, rest for 50 minutes and walk/stretch for 10 minutes per hour. After 5 days, you may slowly increase activity as tolerated.  °Perform the exercises you were provided twice a day for about 15-20 minutes each session. Begin these 2 days following surgery. °Walk with your walker as instructed. Use the walker until you are comfortable transitioning to a cane. Walk with the cane in the opposite hand of the operative leg. You may discontinue the cane once you are comfortable and walking steadily. °Avoid periods of inactivity such as sitting longer than an hour when not asleep. This helps prevent blood clots.  °Do not drive a car for 6 weeks or until released by your surgeon.  °Do not drive while taking narcotics. ° °TED HOSE STOCKINGS °Wear the elastic stockings on both legs for three weeks following surgery during the day. You may remove them at night while sleeping. ° °WEIGHT BEARING °Weight bearing as tolerated with assist device (walker, cane, etc) as directed, use it as long as suggested by your surgeon or therapist, typically at least 4-6 weeks. ° °POSTOPERATIVE CONSTIPATION PROTOCOL °Constipation - defined medically as fewer than three stools per week and severe constipation as less than one stool per week. ° °One of the most common issues patients have following surgery is constipation.  Even if you have a regular bowel pattern at home, your normal regimen is likely to be disrupted due to multiple reasons following surgery.  Combination of anesthesia, postoperative narcotics, change in appetite and fluid intake all can affect your bowels.    In order to avoid complications following surgery, here are some recommendations in order to help you during your recovery period. ° °Colace (docusate) - Pick up an over-the-counter form of Colace or another stool softener and take twice a day as long as you are requiring postoperative pain medications.  Take with a full glass of water daily.  If  you experience loose stools or diarrhea, hold the colace until you stool forms back up.  If your symptoms do not get better within 1 week or if they get worse, check with your doctor. °Dulcolax (bisacodyl) - Pick up over-the-counter and take as directed by the product packaging as needed to assist with the movement of your bowels.  Take with a full glass of water.  Use this product as needed if not relieved by Colace only.  °MiraLax (polyethylene glycol) - Pick up over-the-counter to have on hand.  MiraLax is a solution that will increase the amount of water in your bowels to assist with bowel movements.  Take as directed and can mix with a glass of water, juice, soda, coffee, or tea.  Take if you go more than two days without a movement.Do not use MiraLax more than once per day. Call your doctor if you are still constipated or irregular after using this medication for 7 days in a row. ° °If you continue to have problems with postoperative constipation, please contact the office for further assistance and recommendations.  If you experience "the worst abdominal pain ever" or develop nausea or vomiting, please contact the office immediatly for further recommendations for treatment. ° °ITCHING ° If you experience itching with your medications, try taking only a single pain pill, or even half a pain pill at a time.  You can also use Benadryl over the counter for itching or also to help with sleep.  ° °MEDICATIONS °See your medication summary on the “After Visit Summary” that the nursing staff will review with you prior to discharge.  You may have some home medications which will be placed on hold until you complete the course of blood thinner medication.  It is important for you to complete the blood thinner medication as prescribed by your surgeon.  Continue your approved medications as instructed at time of discharge. ° °PRECAUTIONS °If you experience chest pain or shortness of breath - call 911 immediately for  transfer to the hospital emergency department.  °If you develop a fever greater that 101 F, purulent drainage from wound, increased redness or drainage from wound, foul odor from the wound/dressing, or calf pain - CONTACT YOUR SURGEON.   °                                                °FOLLOW-UP APPOINTMENTS °Make sure you keep all of your appointments after your operation with your surgeon and caregivers. You should call the office at the above phone number and make an appointment for approximately two weeks after the date of your surgery or on the date instructed by your surgeon outlined in the "After Visit Summary". ° °RANGE OF MOTION AND STRENGTHENING EXERCISES  °These exercises are designed to help you keep full movement of your hip joint. Follow your caregiver's or physical therapist's instructions. Perform all exercises about fifteen times, three times per day or as directed. Exercise both hips, even if you have had only   one joint replacement. These exercises can be done on a training (exercise) mat, on the floor, on a table or on a bed. Use whatever works the best and is most comfortable for you. Use music or television while you are exercising so that the exercises are a pleasant break in your day. This will make your life better with the exercises acting as a break in routine you can look forward to.  °Lying on your back, slowly slide your foot toward your buttocks, raising your knee up off the floor. Then slowly slide your foot back down until your leg is straight again.  °Lying on your back spread your legs as far apart as you can without causing discomfort.  °Lying on your side, raise your upper leg and foot straight up from the floor as far as is comfortable. Slowly lower the leg and repeat.  °Lying on your back, tighten up the muscle in the front of your thigh (quadriceps muscles). You can do this by keeping your leg straight and trying to raise your heel off the floor. This helps strengthen the  largest muscle supporting your knee.  °Lying on your back, tighten up the muscles of your buttocks both with the legs straight and with the knee bent at a comfortable angle while keeping your heel on the floor.  ° °POST-OPERATIVE OPIOID TAPER INSTRUCTIONS: °It is important to wean off of your opioid medication as soon as possible. If you do not need pain medication after your surgery it is ok to stop day one. °Opioids include: °Codeine, Hydrocodone(Norco, Vicodin), Oxycodone(Percocet, oxycontin) and hydromorphone amongst others.  °Long term and even short term use of opiods can cause: °Increased pain response °Dependence °Constipation °Depression °Respiratory depression °And more.  °Withdrawal symptoms can include °Flu like symptoms °Nausea, vomiting °And more °Techniques to manage these symptoms °Hydrate well °Eat regular healthy meals °Stay active °Use relaxation techniques(deep breathing, meditating, yoga) °Do Not substitute Alcohol to help with tapering °If you have been on opioids for less than two weeks and do not have pain than it is ok to stop all together.  °Plan to wean off of opioids °This plan should start within one week post op of your joint replacement. °Maintain the same interval or time between taking each dose and first decrease the dose.  °Cut the total daily intake of opioids by one tablet each day °Next start to increase the time between doses. °The last dose that should be eliminated is the evening dose.  ° °IF YOU ARE TRANSFERRED TO A SKILLED REHAB FACILITY °If the patient is transferred to a skilled rehab facility following release from the hospital, a list of the current medications will be sent to the facility for the patient to continue.  When discharged from the skilled rehab facility, please have the facility set up the patient's Home Health Physical Therapy prior to being released. Also, the skilled facility will be responsible for providing the patient with their medications at time of  release from the facility to include their pain medication, the muscle relaxants, and their blood thinner medication. If the patient is still at the rehab facility at time of the two week follow up appointment, the skilled rehab facility will also need to assist the patient in arranging follow up appointment in our office and any transportation needs. ° °MAKE SURE YOU:  °Understand these instructions.  °Get help right away if you are not doing well or get worse.  ° ° °DENTAL ANTIBIOTICS: ° °In most   cases prophylactic antibiotics for Dental procdeures after total joint surgery are not necessary. ° °Exceptions are as follows: ° °1. History of prior total joint infection ° °2. Severely immunocompromised (Organ Transplant, cancer chemotherapy, Rheumatoid biologic °meds such as Humera) ° °3. Poorly controlled diabetes (A1C &gt; 8.0, blood glucose over 200) ° °If you have one of these conditions, contact your surgeon for an antibiotic prescription, prior to your °dental procedure.  ° ° °Pick up stool softner and laxative for home use following surgery while on pain medications. °Do not submerge incision under water. °Please use good hand washing techniques while changing dressing each day. °May shower starting three days after surgery. °Please use a clean towel to pat the incision dry following showers. °Continue to use ice for pain and swelling after surgery. °Do not use any lotions or creams on the incision until instructed by your surgeon. ° °

## 2021-09-16 NOTE — Transfer of Care (Signed)
Immediate Anesthesia Transfer of Care Note  Patient: [name]  Procedure(s) Performed: Procedure(s): TOTAL HIP ARTHROPLASTY ANTERIOR APPROACH (Left)  Patient Location: PACU  Anesthesia Type:Spinal  Level of Consciousness: awake, alert  and oriented  Airway & Oxygen Therapy: Patient Spontanous Breathing  Post-op Assessment: Report given to RN and Post -op Vital signs reviewed and stable  Post vital signs: Reviewed and stable  Last Vitals:  Vitals:   09/16/21 1208  BP: 137/71  Pulse: 82  Resp: 18  Temp: 37.3 C    Complications: No apparent anesthesia complications

## 2021-09-16 NOTE — Op Note (Signed)
OPERATIVE REPORT- TOTAL HIP ARTHROPLASTY   PREOPERATIVE DIAGNOSIS: Osteoarthritis of the Left hip.   POSTOPERATIVE DIAGNOSIS: Osteoarthritis of the Left  hip.   PROCEDURE: Left total hip arthroplasty, anterior approach.   SURGEON: Gaynelle Arabian, MD   ASSISTANT: Theresa Duty, PA-C  ANESTHESIA:  Spinal  ESTIMATED BLOOD LOSS:-300 mL    DRAINS: None  COMPLICATIONS: None   CONDITION: PACU - hemodynamically stable.   BRIEF CLINICAL NOTE: [name] is a 75 y.o. female who has advanced end-  stage arthritis of their Left  hip with progressively worsening pain and  dysfunction. She has a rapidly progressive arthritis and the femoral head is essentially melted away The patient has failed nonoperative management and presents for  total hip arthroplasty.   PROCEDURE IN DETAIL: After successful administration of spinal  anesthetic, the traction boots for the [facility] bed were placed on both  feet and the patient was placed onto the [facility] bed, boots placed into the leg  holders. The Left hip was then isolated from the perineum with plastic  drapes and prepped and draped in the usual sterile fashion. ASIS and  greater trochanter were marked and a oblique incision was made, starting  at about 1 cm lateral and 2 cm distal to the ASIS and coursing towards  the anterior cortex of the femur. The skin was cut with a 10 blade  through subcutaneous tissue to the level of the fascia overlying the  tensor fascia lata muscle. The fascia was then incised in line with the  incision at the junction of the anterior third and posterior 2/3rd. The  muscle was teased off the fascia and then the interval between the TFL  and the rectus was developed. The Hohmann retractor was then placed at  the top of the femoral neck over the capsule. The vessels overlying the  capsule were cauterized and the fat on top of the capsule was removed.  A Hohmann retractor was then placed anterior underneath  the rectus  femoris to give exposure to the entire anterior capsule. A T-shaped  capsulotomy was performed. The edges were tagged and the femoral head  was identified.       Osteophytes are removed off the superior acetabulum.  The femoral neck was then cut in situ with an oscillating saw. Traction  was then applied to the left lower extremity utilizing the [facility]  traction. The femoral head was already gone and there was just soft tissue in the acetabulum  Which was removed. Retractors were placed  around the acetabulum and then circumferential removal of the labrum was  performed. Osteophytes were also removed. Reaming starts at 45 mm to  medialize and  Increased in 2 mm increments to 49 mm. We reamed in  approximately 40 degrees of abduction, 20 degrees anteversion. A 50 mm  pinnacle acetabular shell was then impacted in anatomic position under  fluoroscopic guidance with excellent purchase. We did not need to place  any additional dome screws. A 32 mm neutral + 4 marathon liner was then  placed into the acetabular shell.       The femoral lift was then placed along the lateral aspect of the femur  just distal to the vastus ridge. The leg was  externally rotated and capsule  was stripped off the inferior aspect of the femoral neck down to the  level of the lesser trochanter, this was done with electrocautery. The femur was lifted after this was performed. The  leg was then placed in an extended and adducted position essentially delivering the femur. We also removed the capsule superiorly and the piriformis from the piriformis fossa to gain excellent exposure of the  proximal femur. Rongeur was used to remove some cancellous bone to get  into the lateral portion of the proximal femur for placement of the  initial starter reamer. The starter broaches was placed  the starter broach  and was shown to go down the center of the canal. Broaching  with the Actis system was then performed starting  at size 0  coursing  Up to size 6. A size 6 had excellent torsional and rotational  and axial stability. The trial standard offset neck was then placed  with a 32 + 1 trial head. The hip was then reduced. It was noted that there was a fracture of The greater trochanter but it was stable and did not need fixation. We confirmed that  the stem was in the canal both on AP and lateral x-rays. It also has excellent sizing. The hip was reduced with outstanding stability through full extension and full external rotation.. AP pelvis was taken and the leg lengths were measured and found to be equal. Hip was then dislocated again and the femoral head and neck removed. The  femoral broach was removed. Size 6 Actis stem with a standard offset  neck was then impacted into the femur following native anteversion. Has  excellent purchase in the canal. Excellent torsional and rotational and  axial stability. It is confirmed to be in the canal on AP and lateral  fluoroscopic views. The 32 + 1 metal head was placed and the hip  reduced with outstanding stability. Again AP pelvis was taken and it  confirmed that the leg lengths were equal. The wound was then copiously  irrigated with saline solution and the capsule reattached and repaired  with Ethibond suture. 30 ml of .25% Bupivicaine was  injected into the capsule and into the edge of the tensor fascia lata as well as subcutaneous tissue. The fascia overlying the tensor fascia lata was then closed with a running #1 V-Loc. Subcu was closed with interrupted 2-0 Vicryl and subcuticular running 4-0 Monocryl. Incision was cleaned  and dried. Steri-Strips and a bulky sterile dressing applied. The patient was awakened and transported to  recovery in stable condition.        Please note that a surgical assistant was a medical necessity for this procedure to perform it in a safe and expeditious manner. Assistant was necessary to provide appropriate retraction of vital  neurovascular structures and to prevent femoral fracture and allow for anatomic placement of the prosthesis.  Ollen Gross, M.D.

## 2021-09-16 NOTE — Anesthesia Procedure Notes (Signed)
Spinal  Patient location during procedure: OR Start time: 09/16/2021 3:18 PM Reason for block: surgical anesthesia Staffing Performed: resident/CRNA  Anesthesiologist: Myrtie Soman, MD Resident/CRNA: Gerald Leitz, CRNA Preanesthetic Checklist Completed: patient identified, IV checked, site marked, risks and benefits discussed, surgical consent, monitors and equipment checked, pre-op evaluation and timeout performed Spinal Block Patient position: sitting Prep: DuraPrep Patient monitoring: heart rate, continuous pulse ox, blood pressure and cardiac monitor Approach: midline Location: L3-4 Injection technique: single-shot Needle Needle type: Introducer and Pencan  Needle gauge: 24 G Needle length: 9 cm Assessment Sensory level: T4 Events: CSF return Additional Notes Negative paresthesia. Negative blood return. Positive free-flowing CSF. Expiration date of kit checked and confirmed. Patient tolerated procedure well, without complications.

## 2021-09-16 NOTE — Interval H&P Note (Signed)
History and Physical Interval Note:  09/16/2021 1:15 PM  [name]  has presented today for surgery, with the diagnosis of left hip osteoarthritis.  The various methods of treatment have been discussed with the patient and family. After consideration of risks, benefits and other options for treatment, the patient has consented to  Procedure(s): TOTAL HIP ARTHROPLASTY ANTERIOR APPROACH (Left) as a surgical intervention.  The patient's history has been reviewed, patient examined, no change in status, stable for surgery.  I have reviewed the patient's chart and labs.  Questions were answered to the patient's satisfaction.     Homero Fellers Robby Pirani

## 2021-09-16 NOTE — Progress Notes (Signed)
Orthopedic Tech Progress Note Patient Details:  [name] 1946/06/12 [id number]  Ortho Devices Ortho Device/Splint Location: OHF Ortho Device/Splint Interventions: Ordered   Post Interventions Patient Tolerated: Unable to use device properly  [name] [name] 09/16/2021, 6:58 PM

## 2021-09-16 NOTE — Care Plan (Signed)
Ortho Bundle Case Management Note  Patient Details  Name: [name] MRN: [id number] Date of Birth: 1946/05/18  L THA on 09-16-21 DCP:  Home with dtr.  1 story home with 12 ste, has a chair lift.   DME:  Has a RW.  3-in-1 ordered through [employer]. PT:  HEP                   DME Arranged:  3-N-1 DME Agency:  [employer]  HH Arranged:  NA HH Agency:  NA  Additional Comments: Please contact me with any questions of if this plan should need to change.  Ennis Forts, RN,CCM [facility]  [phone] 09/16/2021, 3:19 PM

## 2021-09-17 ENCOUNTER — Other Ambulatory Visit: Payer: Self-pay

## 2021-09-17 ENCOUNTER — Encounter (HOSPITAL_COMMUNITY): Payer: Self-pay | Admitting: Orthopedic Surgery

## 2021-09-17 LAB — CBC
HCT: 25.8 % — ABNORMAL LOW (ref 36.0–46.0)
Hemoglobin: 7.6 g/dL — ABNORMAL LOW (ref 12.0–15.0)
MCH: 28.1 pg (ref 26.0–34.0)
MCHC: 29.5 g/dL — ABNORMAL LOW (ref 30.0–36.0)
MCV: 95.6 fL (ref 80.0–100.0)
Platelets: 196 10*3/uL (ref 150–400)
RBC: 2.7 MIL/uL — ABNORMAL LOW (ref 3.87–5.11)
RDW: 16 % — ABNORMAL HIGH (ref 11.5–15.5)
WBC: 10.1 10*3/uL (ref 4.0–10.5)
nRBC: 0 % (ref 0.0–0.2)

## 2021-09-17 LAB — BASIC METABOLIC PANEL
Anion gap: 6 (ref 5–15)
BUN: 38 mg/dL — ABNORMAL HIGH (ref 8–23)
CO2: 32 mmol/L (ref 22–32)
Calcium: 8.5 mg/dL — ABNORMAL LOW (ref 8.9–10.3)
Chloride: 100 mmol/L (ref 98–111)
Creatinine, Ser: 1.26 mg/dL — ABNORMAL HIGH (ref 0.44–1.00)
GFR, Estimated: 45 mL/min — ABNORMAL LOW (ref >60)
Glucose, Bld: 177 mg/dL — ABNORMAL HIGH (ref 70–99)
Potassium: 4.3 mmol/L (ref 3.5–5.1)
Sodium: 138 mmol/L (ref 135–145)

## 2021-09-17 MED ORDER — DIPHENHYDRAMINE HCL 25 MG PO CAPS
25.0000 mg | ORAL_CAPSULE | Freq: Four times a day (QID) | ORAL | Status: DC | PRN
  Filled 2021-09-17: qty 2

## 2021-09-17 NOTE — TOC Transition Note (Signed)
Transition of Care [facility]) - CM/SW Discharge Note  Patient Details  Name: [name] MRN: [id number] Date of Birth: 06-07-1946  [facility] CM/SW Contact:  Sherie Don, LCSW Phone Number: 09/17/2021, 11:00 AM  Clinical Narrative: Patient is expected to discharge after working with PT. CSW met with patient and her daughter to review discharge plan and needs. Patient will discharge home with a home exercise program (HEP). Patient has a rolling walker and a frame with handles that is placed over the toilet at home. [employer] discussed a 3N1 with the patient, but the 3N1 is too narrow for the patient and the handles will likely rub against the incision from the surgery. Daughter and patient are declining 3N1 at this time. TOC signing off.  Final next level of care: Home/Self Care Barriers to Discharge: No Barriers Identified  Patient Goals and CMS Choice Patient states their goals for this hospitalization and ongoing recovery are:: Discharge home with HEP CMS Medicare.gov Compare Post Acute Care list provided to:: Patient Choice offered to / list presented to : NA  Discharge Plan and Services         DME Arranged: N/A DME Agency: NA HH Arranged: NA HH Agency: NA  Readmission Risk Interventions Readmission Risk Prevention Plan 06/21/2021  Transportation Screening Complete  PCP or Specialist Appt within 5-7 Days Complete  Home Care Screening Complete  Medication Review (RN CM) Complete  Some recent data might be hidden

## 2021-09-17 NOTE — Plan of Care (Signed)
  Problem: Coping: Goal: Level of anxiety will decrease Outcome: Progressing   Problem: Pain Managment: Goal: General experience of comfort will improve Outcome: Progressing   

## 2021-09-17 NOTE — Evaluation (Signed)
Physical Therapy Evaluation Patient Details Name: [name] MRN: [id number] DOB: 1946/10/01 Today's Date: 09/17/2021  History of Present Illness  Pt is a 75 y.o. female s/p Lt THA on 09/16/21. PMH including but not limited to CHF, CKD III, CAD s/p CABG (2016), COPD on 2L O2 at baseline, OA, hypothyroidism, NSTEMI, obesity, HLD, depression, L TKA, and R TKA  .   Clinical Impression  Pt is POD #1 s/p Lt THA resulting in the deficits listed below (see PT Problem List). Pt reports she was mostly independent with squat/stand pivot transfers to w/c at baseline, has not ambulated since July. Requiring assist for bathing and intermittently bed mobility and supervision for transfers. Pt initially unsuccessful with performance of squat pivot transfers and no AD. Able to perform transfers to w/c and recliner today with +2 assist for safety and use of RW, progressing to +1 during session. Pt will have 24hr assist between multiple family members upon d/c. Pt will benefit from continued skilled PT to maximize functional mobility to increase independence and safety.         Recommendations for follow up therapy are one component of a multi-disciplinary discharge planning process, led by the attending physician.  Recommendations may be updated based on patient status, additional functional criteria and insurance authorization.  Follow Up Recommendations Home health PT    Assistance Recommended at Discharge Frequent or constant Supervision/Assistance  Functional Status Assessment Patient has had a recent decline in their functional status and demonstrates the ability to make significant improvements in function in a reasonable and predictable amount of time.  Equipment Recommendations  None recommended by PT    Recommendations for Other Services       Precautions / Restrictions Precautions Precautions: Fall Restrictions Weight Bearing Restrictions: No LLE Weight Bearing: Weight bearing as  tolerated      Mobility  Bed Mobility Overal bed mobility: Needs Assistance Bed Mobility: Supine to Sit     Supine to sit: Mod assist     General bed mobility comments: MOD A with HOB flat. Assist for progression of L LE and to bring trunk to upright from flat bed.    Transfers Overall transfer level: Needs assistance Equipment used: Rolling walker (2 wheels);None Transfers: Bed to chair/wheelchair/BSC     Step pivot transfers: Min assist;+2 safety/equipment;+2 physical assistance       General transfer comment: MIN A +2 provided. Personal w/c in room. Initial transfer set up to simulate how pt performs at baseline with angled w/c with squat pivot toward R side with use of UEs on armrest, attempted with w/c on opposite side to alow pt to use nonoperative side for push off with lateral scooting. Pt unsuccessful with lateral scooting/squat pivot transfer from EOB to w/c due to pain in Lt hip. Pt agreeable to trial step pivot with RW, cues provided for safe hand placement. MIN A +2 provided to maximize safety and for power up from w/c and EOB. Pt successful with transfer using RW to stand and take lateral steps over to w/c. Pt with onset of nausea and dizziness, vitals 130/37mmHg, 97bpm, 94% O2 on 2L [place], symptoms improved with rest. Pt able to perform transfer to recliner chair progressing to +1 assist to perform stepping over.    Ambulation/Gait                  Stairs            Wheelchair Mobility    Modified Rankin (Stroke Patients Only)  Balance Overall balance assessment: Needs assistance Sitting-balance support: No upper extremity supported;Feet supported Sitting balance-[name]: Good     Standing balance support: Bilateral upper extremity supported;Reliant on assistive device for balance;During functional activity Standing balance-[name]: Poor Standing balance comment: reliant on external support                              Pertinent Vitals/Pain Pain Assessment: Faces Faces Pain Scale: Hurts even more Pain Location: Lt hip Pain Descriptors / Indicators: Discomfort;Burning;Sore;Grimacing Pain Intervention(s): Limited activity within patient's tolerance;Monitored during session;Repositioned;Ice applied    Home Living Family/patient expects to be discharged to:: Private residence Living Arrangements: Children (daughter, son-in-law, 107y.o. grandson) Available Help at Discharge: Family;Available 24 hours/day Type of Home: House Home Access: Ramped entrance       Home Layout: Multi-level;Able to live on main level with bedroom/bathroom (stair lift) Home Equipment: Air cabin crew (4 wheels);Rolling Walker (2 wheels);Wheelchair - manual;Grab bars - toilet;Grab bars - tub/shower;Cane - quad (frame over toilet with arm rest, adjustable bed (head/feet)) Additional Comments: uses 2L O2 at home throughout day    Prior Function Prior Level of Function : Needs assist;Independent/Modified Independent       Physical Assist : Mobility (physical);ADLs (physical) Mobility (physical): Transfers;Bed mobility (intermittently) ADLs (physical): Bathing Mobility Comments: Pt reports she independently performs squat/stand pivots to w/c since July using armrest on w/c. When asked about ambulation limitations, pt reports "I just got lazy when my daughter brought me the w/c" also states that she has been having difficulty with mobility due to pain. Typically propels w/c using B LEs or someomes pushes her in w/c. ADLs Comments: able to dress independently, but has assist with bathing     Hand Dominance   Dominant Hand: Right    Extremity/Trunk Assessment   Upper Extremity Assessment Upper Extremity Assessment: Overall WFL for tasks assessed    Lower Extremity Assessment Lower Extremity Assessment: LLE deficits/detail RLE Deficits / Details: able to perform hip knee flex/ext, SLR, ankle pumps LLE Deficits /  Details: 4+/5 B DF/PF, good quad set strength, able to perform limited hip knee flex/ext in supine    Cervical / Trunk Assessment Cervical / Trunk Assessment: Normal  Communication   Communication: No difficulties  Cognition Arousal/Alertness: Awake/alert Behavior During Therapy: WFL for tasks assessed/performed Overall Cognitive Status: Within Functional Limits for tasks assessed                                          General Comments      Exercises     Assessment/Plan    PT Assessment Patient needs continued PT services  PT Problem List Decreased strength;Decreased range of motion;Decreased activity tolerance;Decreased balance;Decreased mobility;Decreased knowledge of use of DME;Pain       PT Treatment Interventions DME instruction;Gait training;Functional mobility training;Therapeutic activities;Therapeutic exercise;Balance training;Patient/family education;Wheelchair mobility training    PT Goals (Current goals can be found in the Care Plan section)  Acute Rehab PT Goals Patient Stated Goal: Go home and improve independence with less pain PT Goal Formulation: With patient/family Time For Goal Achievement: 10/01/21 Potential to Achieve Goals: Good    Frequency 7X/week   Barriers to discharge        Co-evaluation               AM-PAC PT "6 Clicks" Mobility  Outcome Measure  Help needed turning from your back to your side while in a flat bed without using bedrails?: A Little Help needed moving from lying on your back to sitting on the side of a flat bed without using bedrails?: A Lot Help needed moving to and from a bed to a chair (including a wheelchair)?: A Little Help needed standing up from a chair using your arms (e.g., wheelchair or bedside chair)?: A Little Help needed to walk in hospital room?: Total Help needed climbing 3-5 steps with a railing? : Total 6 Click Score: 13    End of Session Equipment Utilized During Treatment: Gait  belt Activity Tolerance: Patient tolerated treatment well Patient left: in chair;with call bell/phone within reach;with chair alarm set;with family/visitor present Nurse Communication: Mobility status (+2 for safety/assist) PT Visit Diagnosis: Other abnormalities of gait and mobility (R26.89);Muscle weakness (generalized) (M62.81);Pain;Unsteadiness on feet (R26.81) Pain - Right/Left: Left Pain - part of body: Hip    Time: 9163-8466 PT Time Calculation (min) (ACUTE ONLY): 45 min   Charges:   PT Evaluation $PT Eval Low Complexity: 1 Low PT Treatments $Therapeutic Activity: 23-37 mins       Lyman Speller PT, DPT  Acute Rehabilitation Services  Office [phone]  09/17/2021, 2:37 PM

## 2021-09-17 NOTE — Progress Notes (Addendum)
Subjective: 1 Day Post-Op Procedure(s) (LRB): TOTAL HIP ARTHROPLASTY ANTERIOR APPROACH (Left) Patient reports pain as mild.   Patient seen in rounds by Dr. Lequita Halt. Patient is well, and has had no acute complaints or problems. No issues overnight. Foley catheter removed this AM. Denies chest pain, lightheadedness, dizziness, or SOB. We will begin therapy today.   Objective: Vital signs in last 24 hours: Temp:  [97.4 F (36.3 C)-99.1 F (37.3 C)] 97.4 F (36.3 C) (11/15 0622) Pulse Rate:  [68-83] 80 (11/15 0622) Resp:  [14-22] 18 (11/15 0622) BP: (105-137)/(45-71) 128/68 (11/15 0622) SpO2:  [95 %-100 %] 98 % (11/15 0622) Weight:  [90.3 kg] 90.3 kg (11/14 1208)  Intake/Output from previous day:  Intake/Output Summary (Last 24 hours) at 09/17/2021 0749 Last data filed at 09/17/2021 0600 Gross per 24 hour  Intake 3088.41 ml  Output 1825 ml  Net 1263.41 ml     Intake/Output this shift: No intake/output data recorded.  Labs: Recent Labs    09/17/21 0318  HGB 7.6*   Recent Labs    09/17/21 0318  WBC 10.1  RBC 2.70*  HCT 25.8*  PLT 196   Recent Labs    09/17/21 0318  NA 138  K 4.3  CL 100  CO2 32  BUN 38*  CREATININE 1.26*  GLUCOSE 177*  CALCIUM 8.5*   No results for input(s): LABPT, INR in the last 72 hours.  Exam: General - Patient is Alert and Oriented Extremity - Neurologically intact Neurovascular intact Sensation intact distally Dorsiflexion/Plantar flexion intact Dressing - dressing C/D/I Motor Function - intact, moving foot and toes well on exam.   Past Medical History:  Diagnosis Date   Anemia    Arthritis    OA RIGHT KNEE WITH PAIN   Barrett esophagus    BiPAP (biphasic positive airway pressure)    Wears at night   Bradycardia 06/01/2015   CAD in native artery    a. NSTEMI 05/2015 s/p emergent CABG.   Chronic diastolic (congestive) heart failure (HCC)    Chronic kidney disease, stage 3a (HCC)    Chronic respiratory failure (HCC)     Chronic respiratory failure with hypoxia and hypercapnia (HCC) 02/04/2010   Followed in Pulmonary clinic/ [facility]/ [facility]       - 02 dependent  since 07/02/10 >>  83% RA December 05, 2010       - ONO RA 08/05/12  :  Positive sat < 89 x 2:109m> repeat on 2lpm rec 08/12/2012  - 06/17/2013 reported desat with activity p Knee surgery > rec restart 2lpm with activity  - 06/27/2013   Walked 2lpm  x one lap @ 185 stopped due to sat 88% not sob , desat to 82% on RA just at th   COPD III spirometry if use FEV1/VC p saba  07/18/2010   Quit smoking May 2006       - PFT's  04/12/10 FEV1  1.21 (69%) ratio 77 and no change p B2,  DLC0 56%   VC 70%         - PFTs  08/08/2013 FEV1 1.21 (60%) ratio 86 and no change p B2 DLCO 79%  VC 72%  On symbicort 160 2bid  - PFT's  02/08/2018  FEV1 0.70 (40 % ) ratio 56 if use FEV1/VC  p 38 % improvement from saba p symb 160 prior to study with DLCO  78 % corrects to 147  % for alv volume   - 02/08/2018  Cough variant asthma 02/26/2011   Followed in Pulmonary clinic/ [facility]/ [facility]  - PFT's  06/04/15  FEV1 1.20 (67 % ) ratio 83  p 6 % improvement from saba with DLCO  80 % corrects to 132 % for alv volume      - Clinical dx based on response to symbicort       FENO 09/16/2016  =   96 on symbicort 160 2bid > added singulair  Allergy profile 09/16/2016 >  Eos 0.5 /  IgE  78 neg RAST  -  Referred to rehab 04/29/2017 > completed   Depression    Dysrhythmia    Afib   Essential hypertension 04/20/2007   Qualifier: Diagnosis of  By: Paulina Fusi, RN, Daine Gravel    GERD (gastroesophageal reflux disease)    History of ARDS 2006   History of home oxygen therapy    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 small intracranial hemorrhage in setting of HTN.   Mild carotid artery disease ([place])    a. Duplex 1-39% bilaterally in 2016.   Morbid (severe) obesity due to excess calories ([place]) 04/22/2015   pfts with  erv 14% 06/04/15  And 33% 02/08/2018    NSTEMI (non-ST elevated myocardial infarction) ([place]) 05/31/2015   Pneumococcal pneumonia ([place]) 2006   HOSPITALIZED AND DEVELOPED ARDS   Psoriasis    Psoriatic arthritis ([place])    PULMONARY FIBROSIS ILD POST INFLAMMATORY CHRONIC 07/18/2010   Followed as Primary Care Patient/ [facility]/ [facility]  -s/p ARDS 2006 with bacteremic S  Pna       - CT chest 07/03/10 Nonspecific PF mostly upper lobes       - CT chest 12/03/10 acute gg changes and effusions c/w chf - PFT's  02/08/2018  FVC 0.64 (28 %)   with DLCO  78 % corrects to 147 % for alv volume      Rhematoid arthritis    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   SOB (shortness of breath) on exertion     Assessment/Plan: 1 Day Post-Op Procedure(s) (LRB): TOTAL HIP ARTHROPLASTY ANTERIOR APPROACH (Left) Principal Problem:   OA (osteoarthritis) of hip Active Problems:   Primary osteoarthritis of left hip  Estimated body mass index is 40.21 kg/m as calculated from the following:   Height as of this encounter: 4\' 11"  (1.499 m).   Weight as of this encounter: 90.3 kg. Advance diet Up with therapy  DVT Prophylaxis -  Eliquis Weight bearing as tolerated.  Plan is to go Home after hospital stay.  Hemoglobin 7.6 from 9.4 preoperatively. Patient currently asymptomatic, will hold off on transfusion. Will recheck CBC in the AM. Begin mobilizing today with physical therapy. Patient in wheelchair prior to surgery, deconditioning is expected.  Due to extensive medical history, including CHF, CKD III, CAD, COPD, oxygen dependency, will plan to stay at least until tomorrow.   Theresa Duty, PA-C Orthopedic Surgery [phone] 09/17/2021, 7:49 AM

## 2021-09-18 DIAGNOSIS — I2510 Atherosclerotic heart disease of native coronary artery without angina pectoris: Secondary | ICD-10-CM | POA: Diagnosis present

## 2021-09-18 DIAGNOSIS — I130 Hypertensive heart and chronic kidney disease with heart failure and stage 1 through stage 4 chronic kidney disease, or unspecified chronic kidney disease: Secondary | ICD-10-CM | POA: Diagnosis not present

## 2021-09-18 DIAGNOSIS — I4819 Other persistent atrial fibrillation: Secondary | ICD-10-CM | POA: Diagnosis present

## 2021-09-18 DIAGNOSIS — K2270 Barrett's esophagus without dysplasia: Secondary | ICD-10-CM | POA: Diagnosis present

## 2021-09-18 DIAGNOSIS — J9611 Chronic respiratory failure with hypoxia: Secondary | ICD-10-CM | POA: Diagnosis present

## 2021-09-18 DIAGNOSIS — J449 Chronic obstructive pulmonary disease, unspecified: Secondary | ICD-10-CM | POA: Diagnosis not present

## 2021-09-18 DIAGNOSIS — Z9181 History of falling: Secondary | ICD-10-CM | POA: Diagnosis not present

## 2021-09-18 DIAGNOSIS — I5032 Chronic diastolic (congestive) heart failure: Secondary | ICD-10-CM | POA: Diagnosis not present

## 2021-09-18 DIAGNOSIS — Z9981 Dependence on supplemental oxygen: Secondary | ICD-10-CM | POA: Diagnosis not present

## 2021-09-18 DIAGNOSIS — J9612 Chronic respiratory failure with hypercapnia: Secondary | ICD-10-CM | POA: Diagnosis not present

## 2021-09-18 DIAGNOSIS — Z6841 Body Mass Index (BMI) 40.0 and over, adult: Secondary | ICD-10-CM | POA: Diagnosis not present

## 2021-09-18 DIAGNOSIS — I4892 Unspecified atrial flutter: Secondary | ICD-10-CM | POA: Diagnosis present

## 2021-09-18 DIAGNOSIS — J849 Interstitial pulmonary disease, unspecified: Secondary | ICD-10-CM | POA: Diagnosis present

## 2021-09-18 DIAGNOSIS — N1832 Chronic kidney disease, stage 3b: Secondary | ICD-10-CM | POA: Diagnosis not present

## 2021-09-18 DIAGNOSIS — E039 Hypothyroidism, unspecified: Secondary | ICD-10-CM | POA: Diagnosis not present

## 2021-09-18 DIAGNOSIS — J8410 Pulmonary fibrosis, unspecified: Secondary | ICD-10-CM | POA: Diagnosis present

## 2021-09-18 DIAGNOSIS — E785 Hyperlipidemia, unspecified: Secondary | ICD-10-CM | POA: Diagnosis present

## 2021-09-18 DIAGNOSIS — Z66 Do not resuscitate: Secondary | ICD-10-CM | POA: Diagnosis not present

## 2021-09-18 DIAGNOSIS — D62 Acute posthemorrhagic anemia: Secondary | ICD-10-CM | POA: Diagnosis not present

## 2021-09-18 DIAGNOSIS — M25752 Osteophyte, left hip: Secondary | ICD-10-CM | POA: Diagnosis present

## 2021-09-18 DIAGNOSIS — L4050 Arthropathic psoriasis, unspecified: Secondary | ICD-10-CM | POA: Diagnosis not present

## 2021-09-18 DIAGNOSIS — M1612 Unilateral primary osteoarthritis, left hip: Secondary | ICD-10-CM | POA: Diagnosis not present

## 2021-09-18 DIAGNOSIS — K219 Gastro-esophageal reflux disease without esophagitis: Secondary | ICD-10-CM | POA: Diagnosis present

## 2021-09-18 LAB — PREPARE RBC (CROSSMATCH)

## 2021-09-18 LAB — BASIC METABOLIC PANEL
Anion gap: 9 (ref 5–15)
BUN: 45 mg/dL — ABNORMAL HIGH (ref 8–23)
CO2: 27 mmol/L (ref 22–32)
Calcium: 8.5 mg/dL — ABNORMAL LOW (ref 8.9–10.3)
Chloride: 101 mmol/L (ref 98–111)
Creatinine, Ser: 1.67 mg/dL — ABNORMAL HIGH (ref 0.44–1.00)
GFR, Estimated: 32 mL/min — ABNORMAL LOW (ref >60)
Glucose, Bld: 165 mg/dL — ABNORMAL HIGH (ref 70–99)
Potassium: 4.9 mmol/L (ref 3.5–5.1)
Sodium: 137 mmol/L (ref 135–145)

## 2021-09-18 LAB — CBC
HCT: 21.4 % — ABNORMAL LOW (ref 36.0–46.0)
Hemoglobin: 6.4 g/dL — CL (ref 12.0–15.0)
MCH: 28.6 pg (ref 26.0–34.0)
MCHC: 29.9 g/dL — ABNORMAL LOW (ref 30.0–36.0)
MCV: 95.5 fL (ref 80.0–100.0)
Platelets: 180 K/uL (ref 150–400)
RBC: 2.24 MIL/uL — ABNORMAL LOW (ref 3.87–5.11)
RDW: 16.1 % — ABNORMAL HIGH (ref 11.5–15.5)
WBC: 11.7 K/uL — ABNORMAL HIGH (ref 4.0–10.5)
nRBC: 0 % (ref 0.0–0.2)

## 2021-09-18 MED ORDER — SODIUM CHLORIDE 0.9% IV SOLUTION: Freq: Once | INTRAVENOUS | Status: DC

## 2021-09-18 MED ORDER — FUROSEMIDE 10 MG/ML IJ SOLN: 10.0000 mg | Freq: Two times a day (BID) | INTRAMUSCULAR | Status: DC

## 2021-09-18 NOTE — Progress Notes (Signed)
Subjective: 2 Days Post-Op Procedure(s) (LRB): TOTAL HIP ARTHROPLASTY ANTERIOR APPROACH (Left) Patient reports pain as mild.   Patient seen in rounds for Dr. Lequita Halt. Patient is well, and has had no acute complaints or problems other than pain in the left hip with attempted movement. Pain is located down the leg, minimal groin pain. Denies chest pain or SOB.  Plan is to go Home after hospital stay.  Objective: Vital signs in last 24 hours: Temp:  [98.5 F (36.9 C)-98.9 F (37.2 C)] 98.7 F (37.1 C) (11/16 0711) Pulse Rate:  [79-89] 87 (11/16 0711) Resp:  [17-18] 17 (11/16 0711) BP: (110-137)/(44-86) 110/66 (11/16 0711) SpO2:  [94 %-100 %] 99 % (11/16 0711)  Intake/Output from previous day:  Intake/Output Summary (Last 24 hours) at 09/18/2021 0908 Last data filed at 09/18/2021 0711 Gross per 24 hour  Intake 706.42 ml  Output 900 ml  Net -193.58 ml    Intake/Output this shift: Total I/O In: 240 [P.O.:240] Out: 500 [Urine:500]  Labs: Recent Labs    09/17/21 0318 09/18/21 0323  HGB 7.6* 6.4*   Recent Labs    09/17/21 0318 09/18/21 0323  WBC 10.1 11.7*  RBC 2.70* 2.24*  HCT 25.8* 21.4*  PLT 196 180   Recent Labs    09/17/21 0318 09/18/21 0323  NA 138 137  K 4.3 4.9  CL 100 101  CO2 32 27  BUN 38* 45*  CREATININE 1.26* 1.67*  GLUCOSE 177* 165*  CALCIUM 8.5* 8.5*   No results for input(s): LABPT, INR in the last 72 hours.  Exam: General - Patient is Alert and Oriented Extremity - Neurologically intact Neurovascular intact Sensation intact distally Dorsiflexion/Plantar flexion intact Dressing/Incision - clean, dry, no drainage Motor Function - intact, moving foot and toes well on exam.   Past Medical History:  Diagnosis Date   Anemia    Arthritis    OA RIGHT KNEE WITH PAIN   Barrett esophagus    BiPAP (biphasic positive airway pressure)    Wears at night   Bradycardia 06/01/2015   CAD in native artery    a. NSTEMI 05/2015 s/p emergent  CABG.   Chronic diastolic (congestive) heart failure (HCC)    Chronic kidney disease, stage 3a (HCC)    Chronic respiratory failure (HCC)    Chronic respiratory failure with hypoxia and hypercapnia (HCC) 02/04/2010   Followed in Pulmonary clinic/ [facility]/ [facility]       - 02 dependent  since 07/02/10 >>  83% RA December 05, 2010       - ONO RA 08/05/12  :  Positive sat < 89 x 2:44m> repeat on 2lpm rec 08/12/2012  - 06/17/2013 reported desat with activity p Knee surgery > rec restart 2lpm with activity  - 06/27/2013   Walked 2lpm  x one lap @ 185 stopped due to sat 88% not sob , desat to 82% on RA just at th   COPD III spirometry if use FEV1/VC p saba  07/18/2010   Quit smoking May 2006       - PFT's  04/12/10 FEV1  1.21 (69%) ratio 77 and no change p B2,  DLC0 56%   VC 70%         - PFTs  08/08/2013 FEV1 1.21 (60%) ratio 86 and no change p B2 DLCO 79%  VC 72%  On symbicort 160 2bid  - PFT's  02/08/2018  FEV1 0.70 (40 % ) ratio 56 if use FEV1/VC  p 38 %  improvement from saba p symb 160 prior to study with DLCO  78 % corrects to 147  % for alv volume   - 02/08/2018   Cough variant asthma 02/26/2011   Followed in Pulmonary clinic/ [facility]/ [facility]  - PFT's  06/04/15  FEV1 1.20 (67 % ) ratio 83  p 6 % improvement from saba with DLCO  80 % corrects to 132 % for alv volume      - Clinical dx based on response to symbicort       FENO 09/16/2016  =   96 on symbicort 160 2bid > added singulair  Allergy profile 09/16/2016 >  Eos 0.5 /  IgE  78 neg RAST  -  Referred to rehab 04/29/2017 > completed   Depression    Dysrhythmia    Afib   Essential hypertension 04/20/2007   Qualifier: Diagnosis of  By: Paulina Fusi, RN, Daine Gravel    GERD (gastroesophageal reflux disease)    History of ARDS 2006   History of home oxygen therapy    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 small intracranial hemorrhage in setting of HTN.   Mild carotid  artery disease ([place])    a. Duplex 1-39% bilaterally in 2016.   Morbid (severe) obesity due to excess calories ([place]) 04/22/2015   pfts with erv 14% 06/04/15  And 33% 02/08/2018    NSTEMI (non-ST elevated myocardial infarction) ([place]) 05/31/2015   Pneumococcal pneumonia ([place]) 2006   HOSPITALIZED AND DEVELOPED ARDS   Psoriasis    Psoriatic arthritis ([place])    PULMONARY FIBROSIS ILD POST INFLAMMATORY CHRONIC 07/18/2010   Followed as Primary Care Patient/ [facility]/ [facility]  -s/p ARDS 2006 with bacteremic S  Pna       - CT chest 07/03/10 Nonspecific PF mostly upper lobes       - CT chest 12/03/10 acute gg changes and effusions c/w chf - PFT's  02/08/2018  FVC 0.64 (28 %)   with DLCO  78 % corrects to 147 % for alv volume      Rhematoid arthritis    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   SOB (shortness of breath) on exertion     Assessment/Plan: 2 Days Post-Op Procedure(s) (LRB): TOTAL HIP ARTHROPLASTY ANTERIOR APPROACH (Left) Principal Problem:   OA (osteoarthritis) of hip Active Problems:   Primary osteoarthritis of left hip  Estimated body mass index is 40.21 kg/m as calculated from the following:   Height as of this encounter: 4\' 11"  (1.499 m).   Weight as of this encounter: 90.3 kg. Up with therapy  DVT Prophylaxis -  Eliquis Weight-bearing as tolerated  Hgb 6.4 this AM, 2 units ordered for transfusion. Daughter is questioning SNF this AM, discussed that ideally she will be able improve mobility for safe discharge to home with HHPT. I do not believe she would do well in SNF with her numerous medical issues. This will be a last resort. Daughter agreeable.  Will recheck CBC in the AM, continue mobilization with therapy today.  Theresa Duty, PA-C Orthopedic Surgery [phone] 09/18/2021, 9:08 AM

## 2021-09-18 NOTE — Progress Notes (Signed)
Physical Therapy Treatment Patient Details Name: [name] MRN: [id number] DOB: 1946/10/05 Today's Date: 09/18/2021   History of Present Illness Pt is a 75 y.o. female s/p Lt THA on 09/16/21. PMH including but not limited to CHF, CKD III, CAD s/p CABG (2016), COPD on 2L O2 at baseline, OA, hypothyroidism, NSTEMI, obesity, HLD, depression, L TKA, and R TKA  .    PT Comments    Pt started on first unit of PRBCs.  RN in room and okay with LE exercises in bed.  Pt assisted with Lt LE exercises in supine.  Pt and family member requesting BSC for home (notified RN).    Recommendations for follow up therapy are one component of a multi-disciplinary discharge planning process, led by the attending physician.  Recommendations may be updated based on patient status, additional functional criteria and insurance authorization.  Follow Up Recommendations  Home health PT     Assistance Recommended at Discharge Frequent or constant Supervision/Assistance  Equipment Recommendations  BSC/3in1    Recommendations for Other Services       Precautions / Restrictions Precautions Precautions: Fall Restrictions LLE Weight Bearing: Weight bearing as tolerated     Mobility  Bed Mobility                    Transfers                        Ambulation/Gait                   Stairs             Wheelchair Mobility    Modified Rankin (Stroke Patients Only)       Balance                                            Cognition Arousal/Alertness: Awake/alert Behavior During Therapy: WFL for tasks assessed/performed Overall Cognitive Status: Within Functional Limits for tasks assessed                                          Exercises Total Joint Exercises Ankle Circles/Pumps: AROM;Both;10 reps Quad Sets: AROM;Left;10 reps Short Arc Quad: AROM;Left;10 reps;Limitations Short Arc Quad Limitations: required assist  to maintain neutral alignment (tends to externally rotate) Heel Slides: AAROM;Left;10 reps Hip ABduction/ADduction: AAROM;Left;10 reps    General Comments        Pertinent Vitals/Pain Pain Assessment: Faces Faces Pain Scale: Hurts little more Pain Location: Lt hip Pain Descriptors / Indicators: Sore Pain Intervention(s): Repositioned;Monitored during session    Home Living                          Prior Function            PT Goals (current goals can now be found in the care plan section) Progress towards PT goals: Progressing toward goals    Frequency    7X/week      PT Plan Current plan remains appropriate    Co-evaluation              AM-PAC PT "6 Clicks" Mobility   Outcome Measure  Help needed turning from your back to your side while in  a flat bed without using bedrails?: A Little Help needed moving from lying on your back to sitting on the side of a flat bed without using bedrails?: A Lot Help needed moving to and from a bed to a chair (including a wheelchair)?: A Little Help needed standing up from a chair using your arms (e.g., wheelchair or bedside chair)?: A Little Help needed to walk in hospital room?: Total Help needed climbing 3-5 steps with a railing? : Total 6 Click Score: 13    End of Session Equipment Utilized During Treatment: Oxygen Activity Tolerance: Patient tolerated treatment well Patient left: with call bell/phone within reach;with family/visitor present;in bed   PT Visit Diagnosis: Muscle weakness (generalized) (M62.81)     Time: [id number] PT Time Calculation (min) (ACUTE ONLY): 16 min  Charges:  $Therapeutic Exercise: 8-22 mins                     Jannette Spanner PT, DPT Acute Rehabilitation Services Pager: [phone] Office: [place] 09/18/2021, 11:57 AM

## 2021-09-18 NOTE — Progress Notes (Signed)
Physical Therapy Treatment Patient Details Name: [name] MRN: [id number] DOB: 1945-12-11 Today's Date: 09/18/2021   History of Present Illness Pt is a 75 y.o. female s/p Lt THA on 09/16/21. PMH including but not limited to CHF, CKD III, CAD s/p CABG (2016), COPD on 2L O2 at baseline, OA, hypothyroidism, NSTEMI, obesity, HLD, depression, L TKA, and R TKA  .    PT Comments    Pt receiving 2nd unit of PRBCs and agreeable to mobilize as tolerated.  Pt assisted to sitting EOB and performed 3 sit to stands for strengthening and technique.  Pt required frequent rest breaks for fatigue and breathing.  Pt unable to march in place or take small side steps up [facility] today due to fatigue and pain.    Recommendations for follow up therapy are one component of a multi-disciplinary discharge planning process, led by the attending physician.  Recommendations may be updated based on patient status, additional functional criteria and insurance authorization.  Follow Up Recommendations  Home health PT     Assistance Recommended at Discharge Frequent or constant Supervision/Assistance  Equipment Recommendations  BSC/3in1    Recommendations for Other Services       Precautions / Restrictions Precautions Precautions: Fall Restrictions LLE Weight Bearing: Weight bearing as tolerated     Mobility  Bed Mobility Overal bed mobility: Needs Assistance Bed Mobility: Supine to Sit;Sit to Supine     Supine to sit: Mod assist Sit to supine: Mod assist   General bed mobility comments: assist for Lt LE and trunk upright; assist for LEs onto bed    Transfers Overall transfer level: Needs assistance Equipment used: Rolling walker (2 wheels) Transfers: Sit to/from Stand Sit to Stand: Mod assist;Min assist           General transfer comment: verbal cues for technique, assist to rise and steady, pt with difficulty tolerating WBing on Lt LE and encouraged use of RW for support; pt performed  x3 for technique and strengthening; pt unable to take steps up [facility]    Ambulation/Gait                   Stairs             Wheelchair Mobility    Modified Rankin (Stroke Patients Only)       Balance                                            Cognition Arousal/Alertness: Awake/alert Behavior During Therapy: WFL for tasks assessed/performed Overall Cognitive Status: Within Functional Limits for tasks assessed                                          Exercises      General Comments        Pertinent Vitals/Pain Pain Assessment: Faces Faces Pain Scale: Hurts even more Pain Location: Lt hip Pain Descriptors / Indicators: Sore;Aching Pain Intervention(s): Repositioned;Monitored during session    Home Living                          Prior Function            PT Goals (current goals can now be found in the care plan section)  Progress towards PT goals: Progressing toward goals    Frequency    7X/week      PT Plan Current plan remains appropriate    Co-evaluation              AM-PAC PT "6 Clicks" Mobility   Outcome Measure  Help needed turning from your back to your side while in a flat bed without using bedrails?: A Little Help needed moving from lying on your back to sitting on the side of a flat bed without using bedrails?: A Lot Help needed moving to and from a bed to a chair (including a wheelchair)?: A Lot Help needed standing up from a chair using your arms (e.g., wheelchair or bedside chair)?: A Lot Help needed to walk in hospital room?: Total Help needed climbing 3-5 steps with a railing? : Total 6 Click Score: 11    End of Session Equipment Utilized During Treatment: Oxygen;Gait belt Activity Tolerance: Patient tolerated treatment well Patient left: in bed;with call bell/phone within reach;with bed alarm set Nurse Communication: Mobility status PT Visit Diagnosis: Muscle  weakness (generalized) (M62.81);Other abnormalities of gait and mobility (R26.89)     Time: 9937-1696 PT Time Calculation (min) (ACUTE ONLY): 26 min  Charges:  $Therapeutic Activity: 23-37 mins                    Thomasene Mohair PT, DPT Acute Rehabilitation Services Pager: [phone] Office: [phone]  Janan Halter [place] 09/18/2021, 4:45 PM

## 2021-09-18 NOTE — Progress Notes (Signed)
The patient's daughter called the front desk at 21:00 and requested medication for her mother. I went to the room at 21:05 and asked what medication she was requesting, then informed the patient and her daughter that they would be next. I finished up care for room 1329 and came into the hall to get the the medications for room 1333 and heard the patient's daughter yelling at the secretary, stating that she had requested her mother's medication and she was angry that it wasn't given yet. The patient's daughter stated that she was upset with me. After the patient's daughter left the front desk. she told me in the hallway that she had heard me say "stupid bitch" under my breath when she walked off. Arlyn Leak, the Diplomatic Services operational officer, was witness to the fact that I did not say "stupid bitch". I informed the patient's daughter that I was assigning another nurse for her mother's care.

## 2021-09-19 LAB — TYPE AND SCREEN
ABO/RH(D): O POS
Antibody Screen: NEGATIVE
Unit division: 0
Unit division: 0

## 2021-09-19 LAB — BPAM RBC
Blood Product Expiration Date: 202212142359
Blood Product Expiration Date: 202212142359
ISSUE DATE / TIME: 202211161056
ISSUE DATE / TIME: 202211161510
Unit Type and Rh: 5100
Unit Type and Rh: 5100

## 2021-09-19 LAB — CBC
HCT: 27.3 % — ABNORMAL LOW (ref 36.0–46.0)
Hemoglobin: 8.5 g/dL — ABNORMAL LOW (ref 12.0–15.0)
MCH: 29.4 pg (ref 26.0–34.0)
MCHC: 31.1 g/dL (ref 30.0–36.0)
MCV: 94.5 fL (ref 80.0–100.0)
Platelets: 167 10*3/uL (ref 150–400)
RBC: 2.89 MIL/uL — ABNORMAL LOW (ref 3.87–5.11)
RDW: 15.6 % — ABNORMAL HIGH (ref 11.5–15.5)
WBC: 12.6 10*3/uL — ABNORMAL HIGH (ref 4.0–10.5)
nRBC: 0.2 % (ref 0.0–0.2)

## 2021-09-19 NOTE — Progress Notes (Signed)
Physical Therapy Treatment Patient Details Name: [name] MRN: [id number] DOB: 1946/03/01 Today's Date: 09/19/2021   History of Present Illness Pt is a 75 y.o. female s/p Lt THA on 09/16/21. PMH including but not limited to CHF, CKD III, CAD s/p CABG (2016), COPD on 2L O2 at baseline, OA, hypothyroidism, NSTEMI, obesity, HLD, depression, L TKA, and R TKA  .    PT Comments    POD # 3 am session Assisted OOB required much effort and increased time.  General bed mobility comments: great difficulty transfering from supine to EOB esp scooting with heavy use of rail.  Assisted with amb.  General Gait Details: pt very nervous as she has not walked "for quite some time" as she was using a wheelchair.  Used + 2 side by side assist and recliner following closely behind.  Pt was able to amb 3 feet taking several small steps.  Increased oxygen to 3 lts. Returned to room and performed a few TE's followed by ICE.   Recommendations for follow up therapy are one component of a multi-disciplinary discharge planning process, led by the attending physician.  Recommendations may be updated based on patient status, additional functional criteria and insurance authorization.  Follow Up Recommendations  Home health PT     Assistance Recommended at Discharge Frequent or constant Supervision/Assistance  Equipment Recommendations  BSC/3in1    Recommendations for Other Services       Precautions / Restrictions Precautions Precautions: Fall Precaution Comments: home oxygen 2 lts at rest and 3 lts with activity (stated pt) Restrictions Weight Bearing Restrictions: No LLE Weight Bearing: Weight bearing as tolerated     Mobility  Bed Mobility Overal bed mobility: Needs Assistance Bed Mobility: Supine to Sit     Supine to sit: Mod assist;Max assist     General bed mobility comments: great difficulty transfering from supine to EOB esp scooting with heavy use of rail.    Transfers Overall  transfer level: Needs assistance Equipment used: Rolling walker (2 wheels) Transfers: Sit to/from Stand Sit to Stand: Mod assist;Min assist     Step pivot transfers: Min assist;+2 physical assistance;+2 safety/equipment     General transfer comment: + 2 side by side assist for comfort (decrease pt's anxiety)    Ambulation/Gait Ambulation/Gait assistance: Min assist;Mod assist;+2 physical assistance;+2 safety/equipment Gait Distance (Feet): 3 Feet Assistive device: Rolling walker (2 wheels) (youth) Gait Pattern/deviations: Step-to pattern;Decreased stance time - left Gait velocity: decreased     General Gait Details: pt very nervous as she has not walked "for quite some time" as she was using a wheelchair.  Used + 2 side by side assist and recliner following closely behind.  Pt was able to amb 3 feet taking several small steps.  Increased oxygen to 3 lts.   Stairs             Wheelchair Mobility    Modified Rankin (Stroke Patients Only)       Balance                                            Cognition Arousal/Alertness: Awake/alert Behavior During Therapy: WFL for tasks assessed/performed Overall Cognitive Status: Within Functional Limits for tasks assessed  General Comments: AxO x 3 very pleasant Lady with some anxiety.  Also VERY supportive daughter.        Exercises      General Comments        Pertinent Vitals/Pain Pain Assessment: 0-10 Pain Score: 4  Pain Location: L HIP Pain Descriptors / Indicators: Sore;Aching;Tender Pain Intervention(s): Monitored during session;Premedicated before session;Repositioned    Home Living                          Prior Function            PT Goals (current goals can now be found in the care plan section) Progress towards PT goals: Progressing toward goals    Frequency    7X/week      PT Plan Current plan remains appropriate     Co-evaluation              AM-PAC PT "6 Clicks" Mobility   Outcome Measure  Help needed turning from your back to your side while in a flat bed without using bedrails?: A Little Help needed moving from lying on your back to sitting on the side of a flat bed without using bedrails?: A Lot Help needed moving to and from a bed to a chair (including a wheelchair)?: A Lot Help needed standing up from a chair using your arms (e.g., wheelchair or bedside chair)?: A Lot Help needed to walk in hospital room?: A Lot Help needed climbing 3-5 steps with a railing? : Total 6 Click Score: 12    End of Session Equipment Utilized During Treatment: Oxygen;Gait belt Activity Tolerance: Patient tolerated treatment well Patient left: in chair;with call bell/phone within reach;with family/visitor present Nurse Communication: Mobility status PT Visit Diagnosis: Muscle weakness (generalized) (M62.81);Other abnormalities of gait and mobility (R26.89) Pain - Right/Left: Left Pain - part of body: Hip     Time: 3235-5732 PT Time Calculation (min) (ACUTE ONLY): 45 min  Charges:  $Gait Training: 8-22 mins $Therapeutic Exercise: 8-22 mins $Therapeutic Activity: 8-22 mins                    Felecia Shelling  PTA Acute  Rehabilitation Services Pager      [phone] Office      [phone]

## 2021-09-19 NOTE — Progress Notes (Signed)
Subjective: 3 Days Post-Op Procedure(s) (LRB): TOTAL HIP ARTHROPLASTY ANTERIOR APPROACH (Left) Patient reports pain as mild.   Patient seen in rounds by Dr. Lequita Halt. Patient is well, and has had no acute complaints or problems. Denies SOB, chest pain, or calf pain. No acute overnight events. Will continue therapy today.   Plan is to go Home after hospital stay.  Objective: Vital signs in last 24 hours: Temp:  [98 F (36.7 C)-99.3 F (37.4 C)] 98.4 F (36.9 C) (11/17 0422) Pulse Rate:  [82-89] 86 (11/17 0422) Resp:  [15-20] 18 (11/17 0422) BP: (124-144)/(54-86) 139/72 (11/17 0422) SpO2:  [92 %-97 %] 97 % (11/17 0422)  Intake/Output from previous day:  Intake/Output Summary (Last 24 hours) at 09/19/2021 0729 Last data filed at 09/19/2021 0600 Gross per 24 hour  Intake 1814 ml  Output 2850 ml  Net -1036 ml    Intake/Output this shift: No intake/output data recorded.  Labs: Recent Labs    09/17/21 0318 09/18/21 0323 09/19/21 0316  HGB 7.6* 6.4* 8.5*   Recent Labs    09/18/21 0323 09/19/21 0316  WBC 11.7* 12.6*  RBC 2.24* 2.89*  HCT 21.4* 27.3*  PLT 180 167   Recent Labs    09/17/21 0318 09/18/21 0323  NA 138 137  K 4.3 4.9  CL 100 101  CO2 32 27  BUN 38* 45*  CREATININE 1.26* 1.67*  GLUCOSE 177* 165*  CALCIUM 8.5* 8.5*   No results for input(s): LABPT, INR in the last 72 hours.  Exam: General - Patient is Alert and Oriented Extremity - Neurologically intact Neurovascular intact Intact pulses distally Dorsiflexion/Plantar flexion intact Dressing/Incision - clean, dry, no drainage Motor Function - intact, moving foot and toes well on exam.   Past Medical History:  Diagnosis Date   Anemia    Arthritis    OA RIGHT KNEE WITH PAIN   Barrett esophagus    BiPAP (biphasic positive airway pressure)    Wears at night   Bradycardia 06/01/2015   CAD in native artery    a. NSTEMI 05/2015 s/p emergent CABG.   Chronic diastolic (congestive) heart  failure (HCC)    Chronic kidney disease, stage 3a (HCC)    Chronic respiratory failure (HCC)    Chronic respiratory failure with hypoxia and hypercapnia (HCC) 02/04/2010   Followed in Pulmonary clinic/ [facility]/ [facility]       - 02 dependent  since 07/02/10 >>  83% RA December 05, 2010       - ONO RA 08/05/12  :  Positive sat < 89 x 2:64m> repeat on 2lpm rec 08/12/2012  - 06/17/2013 reported desat with activity p Knee surgery > rec restart 2lpm with activity  - 06/27/2013   Walked 2lpm  x one lap @ 185 stopped due to sat 88% not sob , desat to 82% on RA just at th   COPD III spirometry if use FEV1/VC p saba  07/18/2010   Quit smoking May 2006       - PFT's  04/12/10 FEV1  1.21 (69%) ratio 77 and no change p B2,  DLC0 56%   VC 70%         - PFTs  08/08/2013 FEV1 1.21 (60%) ratio 86 and no change p B2 DLCO 79%  VC 72%  On symbicort 160 2bid  - PFT's  02/08/2018  FEV1 0.70 (40 % ) ratio 56 if use FEV1/VC  p 38 % improvement from saba p symb 160 prior to study  with DLCO  78 % corrects to 147  % for alv volume   - 02/08/2018   Cough variant asthma 02/26/2011   Followed in Pulmonary clinic/ [facility]/ [facility]  - PFT's  06/04/15  FEV1 1.20 (67 % ) ratio 83  p 6 % improvement from saba with DLCO  80 % corrects to 132 % for alv volume      - Clinical dx based on response to symbicort       FENO 09/16/2016  =   96 on symbicort 160 2bid > added singulair  Allergy profile 09/16/2016 >  Eos 0.5 /  IgE  78 neg RAST  -  Referred to rehab 04/29/2017 > completed   Depression    Dysrhythmia    Afib   Essential hypertension 04/20/2007   Qualifier: Diagnosis of  By: Paulina Fusi, RN, Daine Gravel    GERD (gastroesophageal reflux disease)    History of ARDS 2006   History of home oxygen therapy    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 small intracranial hemorrhage in setting of HTN.   Mild carotid artery disease ([place])    a. Duplex 1-39%  bilaterally in 2016.   Morbid (severe) obesity due to excess calories ([place]) 04/22/2015   pfts with erv 14% 06/04/15  And 33% 02/08/2018    NSTEMI (non-ST elevated myocardial infarction) ([place]) 05/31/2015   Pneumococcal pneumonia ([place]) 2006   HOSPITALIZED AND DEVELOPED ARDS   Psoriasis    Psoriatic arthritis ([place])    PULMONARY FIBROSIS ILD POST INFLAMMATORY CHRONIC 07/18/2010   Followed as Primary Care Patient/ [facility]/ [facility]  -s/p ARDS 2006 with bacteremic S  Pna       - CT chest 07/03/10 Nonspecific PF mostly upper lobes       - CT chest 12/03/10 acute gg changes and effusions c/w chf - PFT's  02/08/2018  FVC 0.64 (28 %)   with DLCO  78 % corrects to 147 % for alv volume      Rhematoid arthritis    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   SOB (shortness of breath) on exertion     Assessment/Plan: 3 Days Post-Op Procedure(s) (LRB): TOTAL HIP ARTHROPLASTY ANTERIOR APPROACH (Left) Principal Problem:   OA (osteoarthritis) of hip Active Problems:   Primary osteoarthritis of left hip  Estimated body mass index is 40.21 kg/m as calculated from the following:   Height as of this encounter: 4\' 11"  (1.499 m).   Weight as of this encounter: 90.3 kg. Up with therapy  DVT Prophylaxis - TED hose and Eliquis Weight-bearing as tolerated  Hgb 8.5 this am after receiving two units yesterday. Will continue to monitor.  Plan for two sessions with PT today.   Fenton Foy, H. Rivera Colon, PA-C Orthopedic Surgery [phone] 09/19/2021, 7:29 AM

## 2021-09-19 NOTE — Anesthesia Postprocedure Evaluation (Signed)
Anesthesia Post Note  Patient: [name]  Procedure(s) Performed: TOTAL HIP ARTHROPLASTY ANTERIOR APPROACH (Left: Hip)     Patient location during evaluation: PACU Anesthesia Type: Spinal Level of consciousness: oriented and awake and alert Pain management: pain level controlled Vital Signs Assessment: post-procedure vital signs reviewed and stable Respiratory status: spontaneous breathing, respiratory function stable and patient connected to nasal cannula oxygen Cardiovascular status: blood pressure returned to baseline and stable Postop Assessment: no headache, no backache and no apparent nausea or vomiting Anesthetic complications: no   No notable events documented.  Last Vitals:  Vitals:   09/19/21 0829 09/19/21 1427  BP:  134/60  Pulse:  91  Resp:  18  Temp:  36.7 C  SpO2: 93% 97%    Last Pain:  Vitals:   09/19/21 1427  TempSrc: Oral  PainSc: 0-No pain                 Jacarri Gesner S

## 2021-09-19 NOTE — Progress Notes (Signed)
Physical Therapy Treatment Patient Details Name: [name] MRN: [id number] DOB: 09/22/1946 Today's Date: 09/19/2021   History of Present Illness Pt is a 75 y.o. female s/p Lt THA on 09/16/21. PMH including but not limited to CHF, CKD III, CAD s/p CABG (2016), COPD on 2L O2 at baseline, OA, hypothyroidism, NSTEMI, obesity, HLD, depression, L TKA, and R TKA  .    PT Comments    POD # 3 pm session Assisted OOB to [facility] and back to bed with increased time and effort.   General bed mobility comments: great difficulty transfering from supine to EOB esp scooting with heavy use of rail.  Then increased assist to support B LE up onto bed with + 2 side by side assist to scoot to [facility] General transfer comment: pt was able to self rise from elevated bed at [facility] and complete 1/4 pivot turn to [facility].  Required assist with hygiene then comp0leted 1/4 pivot turn with a few steps back to bed. General Gait Details: less anxious pt was able to take a few steps to [facility] with 50% VC's to step R LE "quick right". Positioned in bed to comfort and applied ICE. Pt plans to D/C to home with daughter.  Pt has a wheelchair and a ramp.   Recommendations for follow up therapy are one component of a multi-disciplinary discharge planning process, led by the attending physician.  Recommendations may be updated based on patient status, additional functional criteria and insurance authorization.  Follow Up Recommendations  Home health PT     Assistance Recommended at Discharge Frequent or constant Supervision/Assistance  Equipment Recommendations  BSC/3in1    Recommendations for Other Services       Precautions / Restrictions Precautions Precautions: Fall Precaution Comments: home oxygen 2 lts at rest and 3 lts with activity (stated pt) Restrictions Weight Bearing Restrictions: No LLE Weight Bearing: Weight bearing as tolerated     Mobility  Bed Mobility Overal bed mobility: Needs Assistance Bed  Mobility: Supine to Sit;Sit to Supine     Supine to sit: Mod assist;Max assist Sit to supine: Max assist   General bed mobility comments: great difficulty transfering from supine to EOB esp scooting with heavy use of rail.  Then increased assist to support B LE up onto bed with + 2 side by side assist to scoot to [facility].,    Transfers Overall transfer level: Needs assistance Equipment used: Rolling walker (2 wheels) Transfers: Sit to/from Stand Sit to Stand: Min assist     Step pivot transfers: Min assist;+2 physical assistance;+2 safety/equipment     General transfer comment: pt was able to self rise from elevated bed at [facility] and complete 1/4 pivot turn to [facility].  Required assist with hygiene then comp0leted 1/4 pivot turn with a few steps back to bed.    Ambulation/Gait Ambulation/Gait assistance: Min assist;Mod assist;+2 physical assistance;+2 safety/equipment Gait Distance (Feet): 2 Feet Assistive device: Rolling walker (2 wheels) Gait Pattern/deviations: Step-to pattern;Decreased stance time - left Gait velocity: decreased     General Gait Details: less anxious pt was able to take a few steps to [facility] with 50% VC's to step R LE "quick right".   Stairs             Wheelchair Mobility    Modified Rankin (Stroke Patients Only)       Balance  Cognition Arousal/Alertness: Awake/alert Behavior During Therapy: WFL for tasks assessed/performed Overall Cognitive Status: Within Functional Limits for tasks assessed                                 General Comments: AxO x 3 very pleasant Lady with some anxiety.  Also VERY supportive daughter.        Exercises      General Comments        Pertinent Vitals/Pain Pain Assessment: 0-10 Pain Score: 5  Pain Location: L HIP Pain Descriptors / Indicators: Sore;Aching;Tender Pain Intervention(s): Monitored during session;Repositioned;Ice  applied;Premedicated before session    Home Living                          Prior Function            PT Goals (current goals can now be found in the care plan section) Progress towards PT goals: Progressing toward goals    Frequency    7X/week      PT Plan Current plan remains appropriate    Co-evaluation              AM-PAC PT "6 Clicks" Mobility   Outcome Measure  Help needed turning from your back to your side while in a flat bed without using bedrails?: A Lot Help needed moving from lying on your back to sitting on the side of a flat bed without using bedrails?: A Lot Help needed moving to and from a bed to a chair (including a wheelchair)?: A Lot Help needed standing up from a chair using your arms (e.g., wheelchair or bedside chair)?: A Lot Help needed to walk in hospital room?: A Lot Help needed climbing 3-5 steps with a railing? : Total 6 Click Score: 11    End of Session Equipment Utilized During Treatment: Oxygen;Gait belt Activity Tolerance: Patient tolerated treatment well Patient left: in bed;with bed alarm set;with call bell/phone within reach Nurse Communication: Mobility status PT Visit Diagnosis: Muscle weakness (generalized) (M62.81);Other abnormalities of gait and mobility (R26.89) Pain - Right/Left: Left Pain - part of body: Hip     Time: 1450-1515 PT Time Calculation (min) (ACUTE ONLY): 25 min  Charges:  $Gait Training: 8-22 mins $Therapeutic Activity: 8-22 mins                    Felecia Shelling  PTA Acute  Rehabilitation Services Pager      [phone] Office      [phone]

## 2021-09-20 NOTE — Plan of Care (Signed)
  Problem: Education: Goal: Knowledge of General Education information will improve Description Including pain rating scale, medication(s)/side effects and non-pharmacologic comfort measures Outcome: Progressing   

## 2021-09-20 NOTE — Care Management Important Message (Signed)
Important Message  Patient Details IM Letter given to the Patient. Name: [name] MRN: [id number] Date of Birth: 06/05/1946   Medicare Important Message Given:  Yes     Caren Macadam 09/20/2021, 11:37 AM

## 2021-09-20 NOTE — Progress Notes (Signed)
Physical Therapy Treatment Patient Details Name: [name] MRN: [id number] DOB: 1946-10-18 Today's Date: 09/20/2021   History of Present Illness Pt is a 75 y.o. female s/p Lt THA on 09/16/21. PMH including but not limited to CHF, CKD III, CAD s/p CABG (2016), COPD on 2L O2 at baseline, OA, hypothyroidism, NSTEMI, obesity, HLD, depression, L TKA, and R TKA  .    PT Comments    POD # 4 am session Assisted OOB to [facility] required increased time and effort. Allowed pt to stay on [facility] extended time  Recommendations for follow up therapy are one component of a multi-disciplinary discharge planning process, led by the attending physician.  Recommendations may be updated based on patient status, additional functional criteria and insurance authorization.  Follow Up Recommendations  Home health PT     Assistance Recommended at Discharge Frequent or constant Supervision/Assistance  Equipment Recommendations  BSC/3in1    Recommendations for Other Services       Precautions / Restrictions Precautions Precautions: Fall Precaution Comments: home oxygen 2 lts at rest and 3 lts with activity (stated pt) Restrictions Weight Bearing Restrictions: No LLE Weight Bearing: Weight bearing as tolerated     Mobility  Bed Mobility Overal bed mobility: Needs Assistance Bed Mobility: Supine to Sit     Supine to sit: Max assist     General bed mobility comments: great difficulty transfering from supine to EOB esp scooting with heavy use of rail. Utilized bed pad to complete scooting to EOB.  Discussed poss sleeping in her recliner chair initially when D/C to home.    Transfers   Equipment used: Rolling walker (2 wheels) Transfers: Sit to/from Stand Sit to Stand: Min assist           General transfer comment: pt was able to self rise from elevated bed at [facility] and complete 1/4 pivot turn to [facility].  Required assist with hygiene.    Ambulation/Gait Ambulation/Gait assistance:  Min assist Gait Distance (Feet): 1 Feet Assistive device: Rolling walker (2 wheels) Gait Pattern/deviations: Step-to pattern;Decreased stance time - left Gait velocity: decreased     General Gait Details: less anxious pt was able to take a few steps forward with 50% VC's to step R LE "quick right".   Stairs             Wheelchair Mobility    Modified Rankin (Stroke Patients Only)       Balance                                            Cognition Arousal/Alertness: Awake/alert Behavior During Therapy: WFL for tasks assessed/performed Overall Cognitive Status: Within Functional Limits for tasks assessed                                 General Comments: AxO x 3 very pleasant Lady with some anxiety.  Also VERY supportive daughter.        Exercises      General Comments        Pertinent Vitals/Pain Pain Assessment: 0-10 Faces Pain Scale: Hurts little more Pain Location: L HIP Pain Descriptors / Indicators: Sore;Aching;Tender Pain Intervention(s): Monitored during session;Premedicated before session;Repositioned;Ice applied    Home Living  Prior Function            PT Goals (current goals can now be found in the care plan section) Progress towards PT goals: Progressing toward goals    Frequency    7X/week      PT Plan Current plan remains appropriate    Co-evaluation              AM-PAC PT "6 Clicks" Mobility   Outcome Measure  Help needed turning from your back to your side while in a flat bed without using bedrails?: A Lot Help needed moving from lying on your back to sitting on the side of a flat bed without using bedrails?: A Lot Help needed moving to and from a bed to a chair (including a wheelchair)?: A Lot Help needed standing up from a chair using your arms (e.g., wheelchair or bedside chair)?: A Lot Help needed to walk in hospital room?: A Lot Help needed  climbing 3-5 steps with a railing? : A Lot 6 Click Score: 12    End of Session Equipment Utilized During Treatment: Oxygen;Gait belt Activity Tolerance: Patient tolerated treatment well Patient left: on BSC   with call bell/phone within reach Nurse Communication: Mobility status PT Visit Diagnosis: Muscle weakness (generalized) (M62.81);Other abnormalities of gait and mobility (R26.89) Pain - Right/Left: Left Pain - part of body: Hip     Time: [id number] PT Time Calculation (min) (ACUTE ONLY): 13 min  Charges:  $Therapeutic Activity: 8-22 mins                     Rica Koyanagi  PTA Acute  Rehabilitation Services Pager      [phone] Office      [phone]

## 2021-09-20 NOTE — Progress Notes (Signed)
Subjective: 4 Days Post-Op Procedure(s) (LRB): TOTAL HIP ARTHROPLASTY ANTERIOR APPROACH (Left) Patient reports pain as mild.   Patient seen in rounds for Dr. Wynelle Link. Patient is well, and has had no acute complaints or problems, other than hip pain with movement. Denies SOB, chest pain, or calf pain. No acute overnight events. Ambulated 2 feet with therapy yesterday. Will continue therapy today.   Plan is to go Home after hospital stay.  Objective: Vital signs in last 24 hours: Temp:  [97.9 F (36.6 C)-98.9 F (37.2 C)] 98.2 F (36.8 C) (11/18 0544) Pulse Rate:  [81-96] 81 (11/18 0544) Resp:  [16-20] 18 (11/18 0544) BP: (133-161)/(58-78) 133/67 (11/18 0544) SpO2:  [93 %-99 %] 94 % (11/18 0544)  Intake/Output from previous day:  Intake/Output Summary (Last 24 hours) at 09/20/2021 0740 Last data filed at 09/20/2021 0600 Gross per 24 hour  Intake 980 ml  Output 2326 ml  Net -1346 ml    Intake/Output this shift: No intake/output data recorded.  Labs: Recent Labs    09/18/21 0323 09/19/21 0316  HGB 6.4* 8.5*   Recent Labs    09/18/21 0323 09/19/21 0316  WBC 11.7* 12.6*  RBC 2.24* 2.89*  HCT 21.4* 27.3*  PLT 180 167   Recent Labs    09/18/21 0323  NA 137  K 4.9  CL 101  CO2 27  BUN 45*  CREATININE 1.67*  GLUCOSE 165*  CALCIUM 8.5*   No results for input(s): LABPT, INR in the last 72 hours.  Exam: General - Patient is Alert and Oriented Extremity - Neurologically intact Neurovascular intact Intact pulses distally Dorsiflexion/Plantar flexion intact Dressing/Incision - clean, dry, no drainage Motor Function - intact, moving foot and toes well on exam.   Past Medical History:  Diagnosis Date   Anemia    Arthritis    OA RIGHT KNEE WITH PAIN   Barrett esophagus    BiPAP (biphasic positive airway pressure)    Wears at night   Bradycardia 06/01/2015   CAD in native artery    a. NSTEMI 05/2015 s/p emergent CABG.   Chronic diastolic (congestive)  heart failure (HCC)    Chronic kidney disease, stage 3a (HCC)    Chronic respiratory failure (HCC)    Chronic respiratory failure with hypoxia and hypercapnia ([place]) 02/04/2010   Followed in Pulmonary clinic/ [facility]/ [facility]       - 02 dependent  since 07/02/10 >>  83% RA December 05, 2010       - ONO RA 08/05/12  :  Positive sat < 89 x 2:64m> repeat on 2lpm rec 08/12/2012  - 06/17/2013 reported desat with activity p Knee surgery > rec restart 2lpm with activity  - 06/27/2013   Walked 2lpm  x one lap @ 185 stopped due to sat 88% not sob , desat to 82% on RA just at th   COPD III spirometry if use FEV1/VC p saba  07/18/2010   Quit smoking May 2006       - PFT's  04/12/10 FEV1  1.21 (69%) ratio 77 and no change p B2,  DLC0 56%   VC 70%         - PFTs  08/08/2013 FEV1 1.21 (60%) ratio 86 and no change p B2 DLCO 79%  VC 72%  On symbicort 160 2bid  - PFT's  02/08/2018  FEV1 0.70 (40 % ) ratio 56 if use FEV1/VC  p 38 % improvement from saba p symb 160 prior to study with  DLCO  78 % corrects to 147  % for alv volume   - 02/08/2018   Cough variant asthma 02/26/2011   Followed in Pulmonary clinic/ [facility]/ [facility]  - PFT's  06/04/15  FEV1 1.20 (67 % ) ratio 83  p 6 % improvement from saba with DLCO  80 % corrects to 132 % for alv volume      - Clinical dx based on response to symbicort       FENO 09/16/2016  =   96 on symbicort 160 2bid > added singulair  Allergy profile 09/16/2016 >  Eos 0.5 /  IgE  78 neg RAST  -  Referred to rehab 04/29/2017 > completed   Depression    Dysrhythmia    Afib   Essential hypertension 04/20/2007   Qualifier: Diagnosis of  By: Marcelyn Ditty, RN, Katy Fitch    GERD (gastroesophageal reflux disease)    History of ARDS 2006   History of home oxygen therapy    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 small intracranial hemorrhage in setting of HTN.   Mild carotid artery disease (HCC)    a. Duplex 1-39%  bilaterally in 2016.   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   Followed as Primary Care Patient/ [facility]/ [facility]  -s/p ARDS 2006 with bacteremic S  Pna       - CT chest 07/03/10 Nonspecific PF mostly upper lobes       - CT chest 12/03/10 acute gg changes and effusions c/w chf - PFT's  02/08/2018  FVC 0.64 (28 %)   with DLCO  78 % corrects to 147 % for alv volume      Rhematoid arthritis    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   SOB (shortness of breath) on exertion     Assessment/Plan: 4 Days Post-Op Procedure(s) (LRB): TOTAL HIP ARTHROPLASTY ANTERIOR APPROACH (Left) Principal Problem:   OA (osteoarthritis) of hip Active Problems:   Primary osteoarthritis of left hip  Estimated body mass index is 40.21 kg/m as calculated from the following:   Height as of this encounter: 4\' 11"  (1.499 m).   Weight as of this encounter: 90.3 kg. Up with therapy  DVT Prophylaxis - TED hose and Eliquis Weight-bearing as tolerated  Plan for two sessions with PT today - once patient meeting goals with PT, will plan for discharge.   Patient to follow up in two weeks with Dr. in clinic.   The PDMP database was reviewed today prior to any opioid medications being prescribed to this patient.Lequita Halt, MBA, PA-C Orthopedic Surgery [phone] 09/20/2021, 7:40 AM

## 2021-09-20 NOTE — Progress Notes (Signed)
Pt refused bipap tonight said she would like to be without it tonight. Pt is on 2L and resting well. Advised to let the RN know if she changes her mind. RN aware.

## 2021-09-20 NOTE — Progress Notes (Signed)
Physical Therapy Treatment Patient Details Name: [name] MRN: [id number] DOB: 1946-08-25 Today's Date: 09/20/2021   History of Present Illness Pt is a 75 y.o. female s/p Lt THA on 09/16/21. PMH including but not limited to CHF, CKD III, CAD s/p CABG (2016), COPD on 2L O2 at baseline, OA, hypothyroidism, NSTEMI, obesity, HLD, depression, L TKA, and R TKA  .    PT Comments    POD # 4 pm session Pt was able to have a small BM.  General transfer comment: with increased time and effort as well as forward lean, pt was able to self rise from [facility] but unable to perform self peri care.General Gait Details: less anxious pt was able to take a few steps forward with 50% VC's to step R LE "quick right" forward as recliner was pulled behind. Pt plans to return home with family support.  Pt's goal is to be able to transfer on her own and daughter's goal is for pt to amb on her own.  Per chart review, pt has not been able to walk for "some time" and was using a wheelchair to scoot herself around the house.    Recommendations for follow up therapy are one component of a multi-disciplinary discharge planning process, led by the attending physician.  Recommendations may be updated based on patient status, additional functional criteria and insurance authorization.  Follow Up Recommendations  Home health PT     Assistance Recommended at Discharge Frequent or constant Supervision/Assistance  Equipment Recommendations  BSC/3in1    Recommendations for Other Services       Precautions / Restrictions Precautions Precautions: Fall Precaution Comments: home oxygen 2 lts at rest and 3 lts with activity (stated pt) Restrictions Weight Bearing Restrictions: No LLE Weight Bearing: Weight bearing as tolerated     Mobility  Bed Mobility     General bed mobility comments: OOB on BSC    Transfers   Equipment used: Rolling walker (2 wheels) Transfers: Sit to/from Stand Sit to Stand: [place] transfer comment: with increased time and effort as well as forward lean, pt was able to self rise from [facility] but unable to perform self peri care.    Ambulation/Gait Ambulation/Gait assistance: Min assist Gait Distance (Feet): 1 Feet Assistive device: Rolling walker (2 wheels) Gait Pattern/deviations: Step-to pattern;Decreased stance time - left Gait velocity: decreased     General Gait Details: less anxious pt was able to take a few steps forward with 50% VC's to step R LE "quick right" forward as recliner was pulled behind.   Stairs             Wheelchair Mobility    Modified Rankin (Stroke Patients Only)       Balance                                            Cognition Arousal/Alertness: Awake/alert Behavior During Therapy: WFL for tasks assessed/performed Overall Cognitive Status: Within Functional Limits for tasks assessed                                 General Comments: AxO x 3 very pleasant Lady with some anxiety.  Also VERY supportive daughter.        Exercises  General Comments        Pertinent Vitals/Pain Pain Assessment: 0-10 Faces Pain Scale: Hurts little more Pain Location: L HIP Pain Descriptors / Indicators: Sore;Aching;Tender Pain Intervention(s): Monitored during session;Premedicated before session;Repositioned;Ice applied    Home Living                          Prior Function            PT Goals (current goals can now be found in the care plan section) Progress towards PT goals: Progressing toward goals    Frequency    7X/week      PT Plan Current plan remains appropriate    Co-evaluation              AM-PAC PT "6 Clicks" Mobility   Outcome Measure  Help needed turning from your back to your side while in a flat bed without using bedrails?: A Lot Help needed moving from lying on your back to sitting on the side of a flat bed without  using bedrails?: A Lot Help needed moving to and from a bed to a chair (including a wheelchair)?: A Lot Help needed standing up from a chair using your arms (e.g., wheelchair or bedside chair)?: A Lot Help needed to walk in hospital room?: A Lot Help needed climbing 3-5 steps with a railing? : A Lot 6 Click Score: 12    End of Session Equipment Utilized During Treatment: Oxygen;Gait belt Activity Tolerance: Patient tolerated treatment well Patient left: in chair;with call bell/phone within reach Nurse Communication: Mobility status PT Visit Diagnosis: Muscle weakness (generalized) (M62.81);Other abnormalities of gait and mobility (R26.89) Pain - Right/Left: Left Pain - part of body: Hip     Time: 4174-0814 PT Time Calculation (min) (ACUTE ONLY): 11 min  Charges:  $Therapeutic Activity: 8-22 mins                     Felecia Shelling  PTA Acute  Rehabilitation Services Pager      [phone] Office      [phone]

## 2021-09-21 NOTE — Progress Notes (Signed)
Physical Therapy Treatment Patient Details Name: [name] MRN: [id number] DOB: December 27, 1945 Today's Date: 09/21/2021   History of Present Illness Pt is a 75 y.o. female s/p Lt THA on 09/16/21. PMH including but not limited to CHF, CKD III, CAD s/p CABG (2016), COPD on 2L O2 at baseline, OA, hypothyroidism, NSTEMI, obesity, HLD, depression, L TKA, and R TKA  .    PT Comments    Pt took 3 steps forward with min A to advance LLE, distance limited by pain and fatigue. Performed LLE THA HEP with assistance, and pivoted to bedside commode. Pt is slowly progressing with ambulation. Per daughter, pt hasn't ambulated since April.     Recommendations for follow up therapy are one component of a multi-disciplinary discharge planning process, led by the attending physician.  Recommendations may be updated based on patient status, additional functional criteria and insurance authorization.  Follow Up Recommendations  Home health PT     Assistance Recommended at Discharge Frequent or constant Supervision/Assistance  Equipment Recommendations  BSC/3in1    Recommendations for Other Services       Precautions / Restrictions Precautions Precautions: Fall Precaution Comments: home oxygen 2 lts at rest and 3 lts with activity (stated pt) Restrictions Weight Bearing Restrictions: No LLE Weight Bearing: Weight bearing as tolerated     Mobility  Bed Mobility Overal bed mobility: Needs Assistance Bed Mobility: Supine to Sit     Supine to sit: Max assist     General bed mobility comments: assist to raise trunk and pivot hips    Transfers Overall transfer level: Needs assistance Equipment used: Rolling walker (2 wheels) Transfers: Sit to/from Stand Sit to Stand: Min assist Stand pivot transfers: Min assist         General transfer comment: sit to stand x 2 from bed with min A to power up, VCs hand placement, min A to pivot to bedside commode, increased time for all movement     Ambulation/Gait Ambulation/Gait assistance: Min assist Gait Distance (Feet): 2 Feet Assistive device: Rolling walker (2 wheels)         General Gait Details: pt took 3 steps forward, distance limited by pain/fatigue, max VCs for technique, min A to fully advance LLE   Stairs             Wheelchair Mobility    Modified Rankin (Stroke Patients Only)       Balance Overall balance assessment: Needs assistance Sitting-balance support: No upper extremity supported;Feet supported Sitting balance-[name]: Good     Standing balance support: Bilateral upper extremity supported;Reliant on assistive device for balance;During functional activity Standing balance-[name]: Poor Standing balance comment: reliant on external support                            Cognition Arousal/Alertness: Awake/alert Behavior During Therapy: WFL for tasks assessed/performed Overall Cognitive Status: Within Functional Limits for tasks assessed                                 General Comments: AxO x 3 very pleasant Lady with some anxiety.  Also VERY supportive daughter.        Exercises Total Joint Exercises Ankle Circles/Pumps: AROM;Both;10 reps Quad Sets: AROM;Both;Supine;5 reps Short Arc Quad: AROM;Left;10 reps;Limitations Short Arc Quad Limitations: tends to externally rotate Heel Slides: AAROM;Left;10 reps Hip ABduction/ADduction: AAROM;Left;10 reps    General Comments  Pertinent Vitals/Pain Pain Score: 5  Pain Location: L HIP Pain Descriptors / Indicators: Sore Pain Intervention(s): Limited activity within patient's tolerance;Monitored during session;Premedicated before session;Ice applied    Home Living                          Prior Function            PT Goals (current goals can now be found in the care plan section) Acute Rehab PT Goals Patient Stated Goal: Go home and improve independence with less pain; to walk PT  Goal Formulation: With patient/family Time For Goal Achievement: 10/01/21 Potential to Achieve Goals: Good Progress towards PT goals: Progressing toward goals    Frequency    7X/week      PT Plan Current plan remains appropriate    Co-evaluation              AM-PAC PT "6 Clicks" Mobility   Outcome Measure  Help needed turning from your back to your side while in a flat bed without using bedrails?: A Lot Help needed moving from lying on your back to sitting on the side of a flat bed without using bedrails?: A Lot Help needed moving to and from a bed to a chair (including a wheelchair)?: A Lot Help needed standing up from a chair using your arms (e.g., wheelchair or bedside chair)?: A Lot Help needed to walk in hospital room?: A Lot Help needed climbing 3-5 steps with a railing? : A Lot 6 Click Score: 12    End of Session Equipment Utilized During Treatment: Oxygen;Gait belt Activity Tolerance: Patient tolerated treatment well Patient left: with call bell/phone within reach;with family/visitor present;Other (comment) (on bedside commode) Nurse Communication: Mobility status PT Visit Diagnosis: Muscle weakness (generalized) (M62.81);Other abnormalities of gait and mobility (R26.89) Pain - Right/Left: Left Pain - part of body: Hip     Time: [id number] PT Time Calculation (min) (ACUTE ONLY): 24 min  Charges:  $Gait Training: 8-22 mins $Therapeutic Exercise: 8-22 mins                    Blondell Reveal Kistler PT 09/21/2021  Acute Rehabilitation Services Pager [phone] Office [phone]

## 2021-09-21 NOTE — Progress Notes (Signed)
Physical Therapy Treatment Patient Details Name: [name] MRN: [id number] DOB: 1946-06-13 Today's Date: 09/21/2021   History of Present Illness Pt is a 75 y.o. female s/p Lt THA on 09/16/21. PMH including but not limited to CHF, CKD III, CAD s/p CABG (2016), COPD on 2L O2 at baseline, OA, hypothyroidism, NSTEMI, obesity, HLD, depression, L TKA, and R TKA  .    PT Comments    Assisted pt to transfer from her transport chair to 3 in 1, then back to her transport chair. Mod assist to power up from transport chair, min A to power up from 3 in 1. Pt pivoted on RLE for tranfers as she was unable to advance RLE to take any actual steps. She reports feeling fearful/hot/SOB/nauseous with attempted ambulation.  Performed LLE strengthening exercises. Pt is progressing slowly.   Recommendations for follow up therapy are one component of a multi-disciplinary discharge planning process, led by the attending physician.  Recommendations may be updated based on patient status, additional functional criteria and insurance authorization.  Follow Up Recommendations  Home health PT     Assistance Recommended at Discharge Frequent or constant Supervision/Assistance  Equipment Recommendations  BSC/3in1    Recommendations for Other Services       Precautions / Restrictions Precautions Precautions: Fall Precaution Comments: home oxygen 2 lts at rest and 3 lts with activity (stated pt) Restrictions Weight Bearing Restrictions: No LLE Weight Bearing: Weight bearing as tolerated     Mobility  Bed Mobility               General bed mobility comments: up in [facility]    Transfers Overall transfer level: Needs assistance Equipment used: Rolling walker (2 wheels) Transfers: Sit to/from Stand Sit to Stand: Mod assist     Step pivot transfers: Mod assist     General transfer comment: took 3 attempts to achieve full upright standing position for sit to stand from pt's transport chair; then  pivoted to 3 in 1 and pivoted back to WC. Unable to advance RLE to take forward steps, pt pivoted on RLE for transfers. Min A for sit to stand x 3 trials from 3 in 1, VCs for hand placement.    Ambulation/Gait               General Gait Details: unable to advance RLE despite multiple attempts and max VCs for technique   Stairs             Wheelchair Mobility    Modified Rankin (Stroke Patients Only)       Balance Overall balance assessment: Needs assistance Sitting-balance support: No upper extremity supported;Feet supported Sitting balance-[name]: Good     Standing balance support: Bilateral upper extremity supported;Reliant on assistive device for balance;During functional activity Standing balance-[name]: Poor Standing balance comment: reliant on external support                            Cognition Arousal/Alertness: Awake/alert Behavior During Therapy: WFL for tasks assessed/performed Overall Cognitive Status: Within Functional Limits for tasks assessed                                 General Comments: AxO x 3 very pleasant Lady with some anxiety.  Also VERY supportive daughter.        Exercises Total Joint Exercises Ankle Circles/Pumps: AROM;Both;10 reps Hip ABduction/ADduction: AAROM;Left;10  reps Long Arc Quad: AROM;Left;10 reps;Seated Knee Flexion: AROM;Left;15 reps;Seated General Exercises - Lower Extremity Hip Flexion/Marching: AAROM;Left;10 reps;Seated    General Comments        Pertinent Vitals/Pain Pain Score: 7  Pain Location: L HIP Pain Descriptors / Indicators: Sore Pain Intervention(s): Limited activity within patient's tolerance;Monitored during session;Premedicated before session;Ice applied    Home Living                          Prior Function            PT Goals (current goals can now be found in the care plan section) Acute Rehab PT Goals Patient Stated Goal: Go home and  improve independence with less pain PT Goal Formulation: With patient/family Time For Goal Achievement: 10/01/21 Potential to Achieve Goals: Good Progress towards PT goals: Not progressing toward goals - comment (unable to progress with ambulation)    Frequency    7X/week      PT Plan Current plan remains appropriate    Co-evaluation              AM-PAC PT "6 Clicks" Mobility   Outcome Measure  Help needed turning from your back to your side while in a flat bed without using bedrails?: A Lot Help needed moving from lying on your back to sitting on the side of a flat bed without using bedrails?: A Lot Help needed moving to and from a bed to a chair (including a wheelchair)?: A Lot Help needed standing up from a chair using your arms (e.g., wheelchair or bedside chair)?: A Lot Help needed to walk in hospital room?: A Lot Help needed climbing 3-5 steps with a railing? : A Lot 6 Click Score: 12    End of Session Equipment Utilized During Treatment: Oxygen;Gait belt Activity Tolerance: Patient tolerated treatment well Patient left: in chair;with call bell/phone within reach Nurse Communication: Mobility status PT Visit Diagnosis: Muscle weakness (generalized) (M62.81);Other abnormalities of gait and mobility (R26.89) Pain - Right/Left: Left Pain - part of body: Hip     Time: [id number] PT Time Calculation (min) (ACUTE ONLY): 49 min  Charges:  $Therapeutic Exercise: 8-22 mins $Therapeutic Activity: 23-37 mins                    Blondell Reveal Kistler PT 09/21/2021  Acute Rehabilitation Services Pager [phone] Office [phone]

## 2021-09-21 NOTE — Progress Notes (Signed)
   Subjective: 5 Days Post-Op Procedure(s) (LRB): TOTAL HIP ARTHROPLASTY ANTERIOR APPROACH (Left)  Pt continues to improve No pain unless she moves the left hip  Denies any new symptoms or issues Would like to feel confident with walking prior to d/c  Therapy going well Patient reports pain as mild.  Objective:   VITALS:   Vitals:   09/21/21 0508 09/21/21 0528  BP: (!) 147/64   Pulse: 93   Resp: 18   Temp: 97.8 F (36.6 C)   SpO2: 98% 95%    Left hip incision healing well Nv intact distlally No rashes or edema distallyg Good gentle rom  LABS Recent Labs    09/19/21 0316  HGB 8.5*  HCT 27.3*  WBC 12.6*  PLT 167    No results for input(s): NA, K, BUN, CREATININE, GLUCOSE in the last 72 hours.   Assessment/Plan: 5 Days Post-Op Procedure(s) (LRB): TOTAL HIP ARTHROPLASTY ANTERIOR APPROACH (Left) Continue PT/OT today May d/c today vs tomorrow depending on therapy and confidence with ambulation Patient and family in agreement Pain management as needed    Brad Antonieta Iba, MPAS [facility] is now [facility] [address], [place], [place] [address] Phone: [phone] [email or web address] Facebook  [employer]

## 2021-09-21 NOTE — Plan of Care (Signed)
  Problem: Education: Goal: Knowledge of General Education information will improve Description: Including pain rating scale, medication(s)/side effects and non-pharmacologic comfort measures Outcome: Progressing   Problem: Activity: Goal: Risk for activity intolerance will decrease Outcome: Progressing   Problem: Pain Managment: Goal: General experience of comfort will improve Outcome: Progressing   

## 2021-09-22 NOTE — Progress Notes (Addendum)
Physical Therapy Treatment Patient Details Name: [name] MRN: [id number] DOB: 01-Dec-1945 Today's Date: 09/22/2021   History of Present Illness Pt is a 75 y.o. female s/p Lt THA on 09/16/21. PMH including but not limited to CHF, CKD III, CAD s/p CABG (2016), COPD on 2L O2 at baseline, OA, hypothyroidism, NSTEMI, obesity, HLD, depression, L TKA, and R TKA  .    PT Comments    Progressing slowly with mobility. Pt walked ~5 feet x 2 this session. Discussed d/c plan with pt and daughter to see if there were any specific goals pt needed to reach for them both to feel comfortable discharging home. Will continue to work on short distance ambulation and transfers--keeping in mind that-- *pt has been non- to minimally ambulatory since Apr 2022. She will likely be minimally ambulatory upon discharge from the hospital.  Encouraged pt to spend as much time out of bed as possible and to have nursing assist her with toileting/bsc transfers, instead of using purewick, when OOB. This will only help her mobility!  Anticipate pt will need HHPT and possibly OP PT to regain functional mobility independence in a reasonable time frame.     Recommendations for follow up therapy are one component of a multi-disciplinary discharge planning process, led by the attending physician.  Recommendations may be updated based on patient status, additional functional criteria and insurance authorization.  Follow Up Recommendations  Home health PT     Assistance Recommended at Discharge Frequent or constant Supervision/Assistance  Equipment Recommendations  BSC/3in1    Recommendations for Other Services       Precautions / Restrictions Precautions Precautions: Fall Precaution Comments: home oxygen 2 lts at rest and 3 lts with activity (stated pt) Restrictions Weight Bearing Restrictions: No LLE Weight Bearing: Weight bearing as tolerated     Mobility  Bed Mobility Overal bed mobility: Needs  Assistance Bed Mobility: Supine to Sit     Supine to sit: Mod assist;HOB elevated Sit to supine: Max assist   General bed mobility comments: Daughter insisted upon pt using gait belt leg lifter-pt cannot control trunk and manage LEs while holding onto gait belt leg lifter. Assist still required for trunk and bil LEs. Increased time. Cues provided.    Transfers Overall transfer level: Needs assistance Equipment used: Rolling walker (2 wheels) Transfers: Sit to/from Stand Sit to Stand: Mod assist Stand pivot transfers: Min assist         General transfer comment: Min A from elevated bed and Mod A from low bsc. Sit to stand x 2 during session. Cues for safety, technique, hand placement. Pt seems to prefer to pull up on walker.    Ambulation/Gait Ambulation/Gait assistance: Min assist Gait Distance (Feet): 5 Feet (x2) Assistive device: Rolling walker (2 wheels) Gait Pattern/deviations: Step-to pattern;Antalgic       General Gait Details: Cues for safety, technique, sequence, proper use of RW. Walked short in room distance x 2. Fatigues easily which is to be expected.   Stairs             Wheelchair Mobility    Modified Rankin (Stroke Patients Only)       Balance Overall balance assessment: Needs assistance         Standing balance support: Bilateral upper extremity supported;Reliant on assistive device for balance Standing balance-[name]: Poor  Cognition Arousal/Alertness: Awake/alert Behavior During Therapy: WFL for tasks assessed/performed Overall Cognitive Status: Within Functional Limits for tasks assessed                                 General Comments: AxO x 3 very pleasant lady and daughter-both with some anxiety        Exercises Total Joint Exercises Ankle Circles/Pumps: AROM;Both;10 reps Quad Sets: AROM;Both;10 reps Heel Slides: AAROM;Left;10 reps Hip ABduction/ADduction:  AAROM;Left;10 reps    General Comments        Pertinent Vitals/Pain Pain Assessment: Faces Faces Pain Scale: Hurts even more Pain Location: L hip with activity Pain Descriptors / Indicators: Discomfort;Sore Pain Intervention(s): Limited activity within patient's tolerance;Monitored during session;Repositioned    Home Living                          Prior Function            PT Goals (current goals can now be found in the care plan section) Progress towards PT goals: Progressing toward goals    Frequency    7X/week      PT Plan Current plan remains appropriate    Co-evaluation              AM-PAC PT "6 Clicks" Mobility   Outcome Measure  Help needed turning from your back to your side while in a flat bed without using bedrails?: A Lot Help needed moving from lying on your back to sitting on the side of a flat bed without using bedrails?: A Lot Help needed moving to and from a bed to a chair (including a wheelchair)?: A Little Help needed standing up from a chair using your arms (e.g., wheelchair or bedside chair)?: A Lot Help needed to walk in hospital room?: A Little Help needed climbing 3-5 steps with a railing? : A Lot 6 Click Score: 14    End of Session Equipment Utilized During Treatment: Gait belt;Oxygen Activity Tolerance: Patient limited by fatigue;Patient limited by pain Patient left: in bed;with call bell/phone within reach;with family/visitor present   PT Visit Diagnosis: Pain;Other abnormalities of gait and mobility (R26.89);Difficulty in walking, not elsewhere classified (R26.2) Pain - Right/Left: Left Pain - part of body: Hip     Time: 4193-7902 PT Time Calculation (min) (ACUTE ONLY): 35 min  Charges:  $Gait Training: 8-22 mins $Therapeutic Exercise: 8-22 mins $Therapeutic Activity: 8-22 mins                         [name], PT Acute Rehabilitation  Office: [phone] Pager: [phone]

## 2021-09-22 NOTE — Progress Notes (Signed)
Subjective: 6 Days Post-Op Procedure(s) (LRB): TOTAL HIP ARTHROPLASTY ANTERIOR APPROACH (Left) Patient reports pain as 6 on 0-10 scale.   Tolerating PO without N/V +void, +flatus, +BM Denies CP, SOB, fevers/chills, calf pain. Daughter at bedside. Somewhat concerned that patient is not progressing as quickly as they would like with PT and ambulation. Patient would like to be able to ambulate confidently prior to D/C home. Of note, patient has been ambulating very limitedly since April due to hip pain.    Objective: Vital signs in last 24 hours: Temp:  [98 F (36.7 C)-98.8 F (37.1 C)] 98.8 F (37.1 C) (11/20 0613) Pulse Rate:  [84-93] 84 (11/20 0613) Resp:  [15-19] 17 (11/20 0613) BP: (134-139)/(62-76) 134/76 (11/20 0613) SpO2:  [93 %-98 %] 93 % (11/20 0837)  Intake/Output from previous day: 11/19 0701 - 11/20 0700 In: 1020 [P.O.:1020] Out: 1852 [Urine:1851; Stool:1] Intake/Output this shift: No intake/output data recorded.  No results for input(s): HGB in the last 72 hours. No results for input(s): WBC, RBC, HCT, PLT in the last 72 hours. No results for input(s): NA, K, CL, CO2, BUN, CREATININE, GLUCOSE, CALCIUM in the last 72 hours. No results for input(s): LABPT, INR in the last 72 hours.  Neurologically intact Neurovascular intact Sensation intact distally Intact pulses distally Dorsiflexion/Plantar flexion intact Incision: dressing C/D/I No cellulitis present Compartment soft   Assessment/Plan: 6 Days Post-Op Procedure(s) (LRB): TOTAL HIP ARTHROPLASTY ANTERIOR APPROACH (Left) Advance diet Up with therapy WBAT Pain well controlled DVT Ppx: Eliquis,Teds, SCDs, leg pumps, ambulation Encourage IS  D/C when cleared by PT    Rhodia Albright 09/22/2021, 8:40 AM

## 2021-09-22 NOTE — Plan of Care (Signed)
  Problem: Education: Goal: Knowledge of General Education information will improve Description: Including pain rating scale, medication(s)/side effects and non-pharmacologic comfort measures Outcome: Progressing   Problem: Activity: Goal: Risk for activity intolerance will decrease Outcome: Progressing   Problem: Pain Managment: Goal: General experience of comfort will improve Outcome: Progressing   

## 2021-09-22 NOTE — Progress Notes (Signed)
Physical Therapy Treatment Patient Details Name: [name] MRN: [id number] DOB: 03-Jul-1946 Today's Date: 09/22/2021   History of Present Illness Pt is a 75 y.o. female s/p Lt THA on 09/16/21. PMH including but not limited to CHF, CKD III, CAD s/p CABG (2016), COPD on 2L O2 at baseline, OA, hypothyroidism, NSTEMI, obesity, HLD, depression, L TKA, and R TKA  .    PT Comments    Progressing with mobility. Pt walked ~5 feet across room. She performed multiple sit-to-stands and several stand pivots. She tolerated activity well. Will continue to progress activity as tolerated. Encouraged pt to spend as much time out of bed as possible and to have nursing assist her with toileting/bsc transfers instead of using purewick when OOB.   *Pt was non- to minimally ambulatory since Apr 2022. She will likely be minimally ambulatory upon discharge from the hospital.   Anticipate pt will need HHPT, and very likely, OP PT to become independent with ambulation.    Recommendations for follow up therapy are one component of a multi-disciplinary discharge planning process, led by the attending physician.  Recommendations may be updated based on patient status, additional functional criteria and insurance authorization.  Follow Up Recommendations  Home health PT     Assistance Recommended at Discharge Frequent or constant Supervision/Assistance  Equipment Recommendations  BSC/3in1    Recommendations for Other Services       Precautions / Restrictions Precautions Precautions: Fall Precaution Comments: home oxygen 2 lts at rest and 3 lts with activity (stated pt) Restrictions Weight Bearing Restrictions: No LLE Weight Bearing: Weight bearing as tolerated     Mobility  Bed Mobility Overal bed mobility: Needs Assistance Bed Mobility: Supine to Sit     Supine to sit: Mod assist;HOB elevated     General bed mobility comments: assist to raise trunk and manage L LE. increased time. cues  provided.    Transfers Overall transfer level: Needs assistance Equipment used: Rolling walker (2 wheels) Transfers: Sit to/from Stand Sit to Stand: Mod assist Stand pivot transfers: Min assist         General transfer comment: Min A from elevated bed and Mod A from low bsc. Sit to stand x 4 during session. Cues for safety, technique, hand placement.    Ambulation/Gait Ambulation/Gait assistance: Min assist Gait Distance (Feet): 5 Feet Assistive device: Rolling walker (2 wheels) Gait Pattern/deviations: Step-to pattern;Antalgic       General Gait Details: Cues for safety, technique, sequence, proper use of RW. Pt did not require any assistance to advance L LE; she had most trouble with WBing on L. Remained on [place] O2   Stairs             Wheelchair Mobility    Modified Rankin (Stroke Patients Only)       Balance Overall balance assessment: Needs assistance         Standing balance support: Bilateral upper extremity supported;Reliant on assistive device for balance Standing balance-[name]: Poor                              Cognition Arousal/Alertness: Awake/alert Behavior During Therapy: WFL for tasks assessed/performed Overall Cognitive Status: Within Functional Limits for tasks assessed                                 General Comments: AxO x 3 very pleasant lady  and daughter with some anxiety        Exercises Total Joint Exercises Ankle Circles/Pumps: AROM;Both;10 reps Quad Sets: AROM;Both;10 reps Heel Slides: AAROM;Left;10 reps Hip ABduction/ADduction: AAROM;Left;10 reps    General Comments        Pertinent Vitals/Pain Pain Assessment: 0-10 Faces Pain Scale: Hurts even more Pain Location: L hip with activity Pain Descriptors / Indicators: Discomfort;Sore Pain Intervention(s): Limited activity within patient's tolerance;Monitored during session;Ice applied;Repositioned    Home Living                           Prior Function            PT Goals (current goals can now be found in the care plan section) Progress towards PT goals: Progressing toward goals    Frequency    7X/week      PT Plan Current plan remains appropriate    Co-evaluation              AM-PAC PT "6 Clicks" Mobility   Outcome Measure  Help needed turning from your back to your side while in a flat bed without using bedrails?: A Lot Help needed moving from lying on your back to sitting on the side of a flat bed without using bedrails?: A Lot Help needed moving to and from a bed to a chair (including a wheelchair)?: A Little Help needed standing up from a chair using your arms (e.g., wheelchair or bedside chair)?: A Lot Help needed to walk in hospital room?: A Little Help needed climbing 3-5 steps with a railing? : A Lot 6 Click Score: 14    End of Session Equipment Utilized During Treatment: Gait belt;Oxygen Activity Tolerance: Patient limited by fatigue;Patient limited by pain Patient left: in chair;with call bell/phone within reach   PT Visit Diagnosis: Pain;Other abnormalities of gait and mobility (R26.89);Difficulty in walking, not elsewhere classified (R26.2) Pain - Right/Left: Left Pain - part of body: Hip     Time: 1100-1144 PT Time Calculation (min) (ACUTE ONLY): 44 min  Charges:  $Gait Training: 8-22 mins $Therapeutic Exercise: 8-22 mins $Therapeutic Activity: 8-22 mins                        [name], PT Acute Rehabilitation  Office: [phone] Pager: [phone]

## 2021-09-22 NOTE — Progress Notes (Signed)
  Progress Note   Date: 09/20/2021  Patient Name: [name]        MRN#: [id number]  Clarification of diagnosis:  Post-operative (acute) blood loss anemia

## 2021-09-23 MED ORDER — HYDROCODONE-ACETAMINOPHEN 5-325 MG PO TABS: 1.0000 | ORAL_TABLET | Freq: Four times a day (QID) | ORAL | 0 refills | Status: DC | PRN

## 2021-09-23 NOTE — Progress Notes (Signed)
Physical Therapy Treatment Patient Details Name: [name] MRN: [id number] DOB: 10/04/1946 Today's Date: 09/23/2021   History of Present Illness Pt is a 75 y.o. female s/p Lt THA on 09/16/21. PMH including but not limited to CHF, CKD III, CAD s/p CABG (2016), COPD on 2L O2 at baseline, OA, hypothyroidism, NSTEMI, obesity, HLD, depression, L TKA, and R TKA  .    PT Comments    POD # 8 Pt sitting EOB on arrival.  2 lts nasal.  General transfer comment: pt self able to rise from recliner using forward momentumGeneral Gait Details: parked her recliner chair in hallway 9 feet away from bed.  Pt was able to complete distance with increased time, VC's for upright posture and proper walker to self distance.  Pt remained on 2 lts nasal.  Daughter close by encouraging.  Transport wc folloing as a precaution.  Pt has met minimal mobility goals to D/C to home today.  Pt has a ramp entrance and plans to use her transport chair.  Addressed all mobility questions, discussed appropriate activity, educated on use of ICE.  Pt ready for D/C to home.   Recommendations for follow up therapy are one component of a multi-disciplinary discharge planning process, led by the attending physician.  Recommendations may be updated based on patient status, additional functional criteria and insurance authorization.  Follow Up Recommendations  Home health PT     Assistance Recommended at Discharge Frequent or constant Supervision/Assistance  Equipment Recommendations  BSC/3in1    Recommendations for Other Services       Precautions / Restrictions Precautions Precautions: Fall Precaution Comments: home oxygen 2 lts at rest and 3 lts with activity (stated pt) Restrictions Weight Bearing Restrictions: No LLE Weight Bearing: Weight bearing as tolerated     Mobility  Bed Mobility               General bed mobility comments: EOB on arrival    Transfers Overall transfer level: Needs  assistance Equipment used: Rolling walker (2 wheels) Transfers: Sit to/from Stand Sit to Stand: Supervision;Min guard           General transfer comment: pt self able to rise from recliner using forward momentum    Ambulation/Gait Ambulation/Gait assistance: Supervision;Min guard Gait Distance (Feet): 9 Feet Assistive device: Rolling walker (2 wheels) Gait Pattern/deviations: Step-to pattern;Antalgic Gait velocity: decreased     General Gait Details: parked her recliner chair in hallway 9 feet away from bed.  Pt was able to complete distance with increased time, VC's for upright posture and proper walker to self distance.  Pt remained on 2 lts nasal.  Daughter close by encouraging.  Transport wc folloing as a precaution.   Stairs             Wheelchair Mobility    Modified Rankin (Stroke Patients Only)       Balance                                            Cognition Arousal/Alertness: Awake/alert Behavior During Therapy: WFL for tasks assessed/performed Overall Cognitive Status: Within Functional Limits for tasks assessed                                 General Comments: AxO x 3 very pleasant lady and daughter-both with  some anxiety        Exercises      General Comments        Pertinent Vitals/Pain Pain Assessment: Faces Faces Pain Scale: Hurts a little bit Pain Location: L hip with activity Pain Descriptors / Indicators: Discomfort;Sore Pain Intervention(s): Monitored during session;Repositioned;Premedicated before session    Home Living                          Prior Function            PT Goals (current goals can now be found in the care plan section) Progress towards PT goals: Progressing toward goals    Frequency    7X/week      PT Plan Current plan remains appropriate    Co-evaluation              AM-PAC PT "6 Clicks" Mobility   Outcome Measure  Help needed turning from  your back to your side while in a flat bed without using bedrails?: A Little Help needed moving from lying on your back to sitting on the side of a flat bed without using bedrails?: A Little Help needed moving to and from a bed to a chair (including a wheelchair)?: A Little Help needed standing up from a chair using your arms (e.g., wheelchair or bedside chair)?: A Little Help needed to walk in hospital room?: A Little Help needed climbing 3-5 steps with a railing? : A Lot 6 Click Score: 17    End of Session Equipment Utilized During Treatment: Gait belt;Oxygen Activity Tolerance: Patient tolerated treatment well Patient left: in chair;with call bell/phone within reach Nurse Communication: Mobility status (pt ready for D/C to home) PT Visit Diagnosis: Pain;Other abnormalities of gait and mobility (R26.89);Difficulty in walking, not elsewhere classified (R26.2) Pain - Right/Left: Left Pain - part of body: Hip     Time: 1125-1150 PT Time Calculation (min) (ACUTE ONLY): 25 min  Charges:  $Gait Training: 8-22 mins $Therapeutic Activity: 8-22 mins                     {Nikesha Kwasny  PTA Acute  Rehabilitation Services Pager      [phone] Office      [phone]

## 2021-10-02 NOTE — Discharge Summary (Signed)
Physician Discharge Summary   Patient ID: [name] MRN: [id number] DOB/AGE: 1946/09/21 75 y.o.  Admit date: 09/16/2021 Discharge date: 09/23/2021  Primary Diagnosis: Osteoarthritis, left hip   Admission Diagnoses:  Past Medical History:  Diagnosis Date   Anemia    Arthritis    OA RIGHT KNEE WITH PAIN   Barrett esophagus    BiPAP (biphasic positive airway pressure)    Wears at night   Bradycardia 06/01/2015   CAD in native artery    a. NSTEMI 05/2015 s/p emergent CABG.   Chronic diastolic (congestive) heart failure (HCC)    Chronic kidney disease, stage 3a (HCC)    Chronic respiratory failure (HCC)    Chronic respiratory failure with hypoxia and hypercapnia (HCC) 02/04/2010   Followed in Pulmonary clinic/ [facility]/ [facility]       - 02 dependent  since 07/02/10 >>  83% RA December 05, 2010       - ONO RA 08/05/12  :  Positive sat < 89 x 2:43m> repeat on 2lpm rec 08/12/2012  - 06/17/2013 reported desat with activity p Knee surgery > rec restart 2lpm with activity  - 06/27/2013   Walked 2lpm  x one lap @ 185 stopped due to sat 88% not sob , desat to 82% on RA just at th   COPD III spirometry if use FEV1/VC p saba  07/18/2010   Quit smoking May 2006       - PFT's  04/12/10 FEV1  1.21 (69%) ratio 77 and no change p B2,  DLC0 56%   VC 70%         - PFTs  08/08/2013 FEV1 1.21 (60%) ratio 86 and no change p B2 DLCO 79%  VC 72%  On symbicort 160 2bid  - PFT's  02/08/2018  FEV1 0.70 (40 % ) ratio 56 if use FEV1/VC  p 38 % improvement from saba p symb 160 prior to study with DLCO  78 % corrects to 147  % for alv volume   - 02/08/2018   Cough variant asthma 02/26/2011   Followed in Pulmonary clinic/ [facility]/ [facility]  - PFT's  06/04/15  FEV1 1.20 (67 % ) ratio 83  p 6 % improvement from saba with DLCO  80 % corrects to 132 % for alv volume      - Clinical dx based on response to symbicort       FENO 09/16/2016  =   96 on symbicort 160 2bid > added singulair  Allergy profile 09/16/2016 >   Eos 0.5 /  IgE  78 neg RAST  -  Referred to rehab 04/29/2017 > completed   Depression    Dysrhythmia    Afib   Essential hypertension 04/20/2007   Qualifier: Diagnosis of  By: Marcelyn Ditty, RN, Katy Fitch    GERD (gastroesophageal reflux disease)    History of ARDS 2006   History of home oxygen therapy    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 small intracranial hemorrhage in setting of HTN.   Mild carotid artery disease (HCC)    a. Duplex 1-39% bilaterally in 2016.   Morbid (severe) obesity due to excess calories (HCC) 04/22/2015   pfts with erv 14% 06/04/15  And 33% 02/08/2018    NSTEMI (non-ST elevated myocardial infarction) (HCC) 05/31/2015   Pneumococcal pneumonia (HCC) 2006   HOSPITALIZED AND DEVELOPED ARDS  Psoriasis    Psoriatic arthritis ([place])    PULMONARY FIBROSIS ILD POST INFLAMMATORY CHRONIC 07/18/2010   Followed as Primary Care Patient/ [facility]/ [facility]  -s/p ARDS 2006 with bacteremic S  Pna       - CT chest 07/03/10 Nonspecific PF mostly upper lobes       - CT chest 12/03/10 acute gg changes and effusions c/w chf - PFT's  02/08/2018  FVC 0.64 (28 %)   with DLCO  78 % corrects to 147 % for alv volume      Rhematoid arthritis    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   Weight as of this encounter: 90.3 kg.  Procedure:  Procedure(s) (LRB): TOTAL HIP ARTHROPLASTY ANTERIOR APPROACH (Left)   Consults: None  HPI: [name] is a 75 y.o. female who has advanced end-  stage arthritis of their Left  hip with progressively worsening pain and  dysfunction. She has a rapidly progressive arthritis and the femoral  head is essentially melted awayThe patient has failed nonoperative management and presents for  total hip arthroplasty.   Laboratory Data: Admission on 09/16/2021, Discharged on 09/23/2021  Component Date Value Ref Range Status   WBC 09/17/2021 10.1  4.0 - 10.5 K/uL Final   RBC 09/17/2021 2.70 (L)  3.87 - 5.11 MIL/uL Final   Hemoglobin 09/17/2021 7.6 (L)  12.0 - 15.0 g/dL Final   HCT 09/17/2021 25.8 (L)  36.0 - 46.0 % Final   MCV 09/17/2021 95.6  80.0 - 100.0 fL Final   MCH 09/17/2021 28.1  26.0 - 34.0 pg Final   MCHC 09/17/2021 29.5 (L)  30.0 - 36.0 g/dL Final   RDW 09/17/2021 16.0 (H)  11.5 - 15.5 % Final   Platelets 09/17/2021 196  150 - 400 K/uL Final   nRBC 09/17/2021 0.0  0.0 - 0.2 % Final   Performed at [facility], [place] [address] [place], [place] [address]   Sodium 09/17/2021 138  135 - 145 mmol/L Final   Potassium 09/17/2021 4.3  3.5 - 5.1 mmol/L Final   Chloride 09/17/2021 100  98 - 111 mmol/L Final   CO2 09/17/2021 32  22 - 32 mmol/L Final   Glucose, Bld 09/17/2021 177 (H)  70 - 99 mg/dL Final   Glucose reference range applies only to samples taken after fasting for at least 8 hours.   BUN 09/17/2021 38 (H)  8 - 23 mg/dL Final   Creatinine, Ser 09/17/2021 1.26 (H)  0.44 - 1.00 mg/dL Final   Calcium 09/17/2021 8.5 (L)  8.9 - 10.3 mg/dL Final   GFR, Estimated 09/17/2021 45 (L)  >60 mL/min Final   Comment: (NOTE) Calculated using the CKD-EPI Creatinine Equation (2021)    Anion gap 09/17/2021 6  5 - 15 Final   Performed at [facility], [place] [address] [place], [place] [address]   WBC 09/18/2021 11.7 (H)  4.0 - 10.5 K/uL Final   RBC 09/18/2021 2.24 (L)  3.87 - 5.11 MIL/uL Final   Hemoglobin 09/18/2021 6.4 (LL)  12.0 - 15.0 g/dL Final   Comment: REPEATED TO VERIFY THIS CRITICAL RESULT HAS VERIFIED AND BEEN CALLED TO  RN [place] ON 11 16 2022 AT [id number], AND HAS BEEN READ BACK.     HCT 09/18/2021 21.4 (L)  36.0 -  46.0 % Final   MCV 09/18/2021 95.5  80.0 - 100.0 fL Final   MCH 09/18/2021 28.6  26.0 - 34.0 pg Final   MCHC 09/18/2021 29.9 (L)  30.0 - 36.0 g/dL Final   RDW 09/18/2021 16.1 (H)  11.5 - 15.5 % Final   Platelets 09/18/2021 180  150 - 400 K/uL Final   nRBC 09/18/2021 0.0  0.0 - 0.2 % Final   Performed at [facility], [place] [address], [place], [place] [address]   Sodium 09/18/2021 137  135 - 145 mmol/L Final   Potassium 09/18/2021 4.9  3.5 - 5.1 mmol/L Final   Chloride 09/18/2021 101  98 - 111 mmol/L Final   CO2 09/18/2021 27  22 - 32 mmol/L Final   Glucose, Bld 09/18/2021 165 (H)  70 - 99 mg/dL Final   Glucose reference range applies only to samples taken after fasting for at least 8 hours.   BUN 09/18/2021 45 (H)  8 - 23 mg/dL Final   Creatinine, Ser 09/18/2021 1.67 (H)  0.44 - 1.00 mg/dL Final   Calcium 09/18/2021 8.5 (L)  8.9 - 10.3 mg/dL Final   GFR, Estimated 09/18/2021 32 (L)  >60 mL/min Final   Comment: (NOTE) Calculated using the CKD-EPI Creatinine Equation (2021)    Anion gap 09/18/2021 9  5 - 15 Final   Performed at [facility], [place] [address] [place], [place] [address]   Order Confirmation 09/18/2021    Final                   Value:ORDER PROCESSED BY BLOOD BANK Performed at [facility], [place] [address] [place], [place] [address]    WBC 09/19/2021 12.6 (H)  4.0 - 10.5 K/uL Final   RBC 09/19/2021 2.89 (L)  3.87 - 5.11 MIL/uL Final   Hemoglobin 09/19/2021 8.5 (L)  12.0 - 15.0 g/dL Final   Comment: REPEATED TO VERIFY POST TRANSFUSION SPECIMEN    HCT 09/19/2021 27.3 (L)  36.0 - 46.0 % Final   MCV 09/19/2021 94.5  80.0 - 100.0 fL Final   MCH 09/19/2021 29.4  26.0 - 34.0 pg Final   MCHC 09/19/2021 31.1  30.0 - 36.0 g/dL Final   RDW 09/19/2021 15.6 (H)  11.5 - 15.5 % Final   Platelets 09/19/2021 167  150 - 400 K/uL Final   nRBC 09/19/2021 0.2  0.0 - 0.2 % Final   Performed at [facility], [place]  [address] [place], [place] [address]  Orders Only on 09/12/2021  Component Date Value Ref Range Status   SARS Coronavirus 2 09/12/2021 RESULT: NEGATIVE   Final   Comment: RESULT: NEGATIVESARS-CoV-2 INTERPRETATION:A NEGATIVE  test result means that SARS-CoV-2 RNA was not present in the specimen above the limit of detection of this test. This does not preclude a possible SARS-CoV-2 infection and should not be used as the  sole basis for patient management decisions. Negative results must be combined with clinical observations, patient history, and epidemiological information. Optimum specimen types and timing for peak viral levels during infections caused by SARS-CoV-2  have not been determined. Collection of multiple specimens or types of specimens may be necessary to detect virus. Improper specimen collection and handling, sequence variability under primers/probes, or organism present below the limit of detection may  lead  to false negative results. Positive and negative predictive values of testing are highly dependent on prevalence. False negative test results are more likely when prevalence of disease is high.The expected result is NEGATIVE.Fact S                          heet for  Healthcare Providers: LocalChronicle.no Sheet for Patients: SalonLookup.es Reference Range - Negative   Hospital Outpatient Visit on 09/06/2021  Component Date Value Ref Range Status   MRSA, PCR 09/06/2021 POSITIVE (A)  NEGATIVE Final   Comment: RESULT CALLED TO, READ BACK BY AND VERIFIED WITH: PHILLIPS,K. RN @0713  ON 11.7.2022 BY NMCCOY    Staphylococcus aureus 09/06/2021 POSITIVE (A)  NEGATIVE Final   Comment: (NOTE) The Xpert SA Assay (FDA approved for NASAL specimens in patients 22 years of age and older), is one component of a comprehensive surveillance program. It is not intended to diagnose infection nor to guide or monitor treatment. Performed at  [facility], [place] [address] [place], [place] [address]    WBC 09/06/2021 9.6  4.0 - 10.5 K/uL Final   RBC 09/06/2021 3.37 (L)  3.87 - 5.11 MIL/uL Final   Hemoglobin 09/06/2021 9.4 (L)  12.0 - 15.0 g/dL Final   HCT 09/06/2021 31.8 (L)  36.0 - 46.0 % Final   MCV 09/06/2021 94.4  80.0 - 100.0 fL Final   MCH 09/06/2021 27.9  26.0 - 34.0 pg Final   MCHC 09/06/2021 29.6 (L)  30.0 - 36.0 g/dL Final   RDW 09/06/2021 16.3 (H)  11.5 - 15.5 % Final   Platelets 09/06/2021 218  150 - 400 K/uL Final   nRBC 09/06/2021 0.0  0.0 - 0.2 % Final   Performed at [place] [address] [place], [place] [address]   Sodium 09/06/2021 140  135 - 145 mmol/L Final   Potassium 09/06/2021 5.0  3.5 - 5.1 mmol/L Final   Chloride 09/06/2021 100  98 - 111 mmol/L Final   CO2 09/06/2021 32  22 - 32 mmol/L Final   Glucose, Bld 09/06/2021 108 (H)  70 - 99 mg/dL Final   Glucose reference range applies only to samples taken after fasting for at least 8 hours.   BUN 09/06/2021 42 (H)  8 - 23 mg/dL Final   Creatinine, Ser 09/06/2021 1.50 (H)  0.44 - 1.00 mg/dL Final   Calcium 09/06/2021 9.6  8.9 - 10.3 mg/dL Final   Total Protein 09/06/2021 7.1  6.5 - 8.1 g/dL Final   Albumin 09/06/2021 3.8  3.5 - 5.0 g/dL Final   AST 09/06/2021 20  15 - 41 U/L Final   ALT 09/06/2021 14  0 - 44 U/L Final   Alkaline Phosphatase 09/06/2021 55  38 - 126 U/L Final   Total Bilirubin 09/06/2021 0.5  0.3 - 1.2 mg/dL Final   GFR, Estimated 09/06/2021 36 (L)  >60 mL/min Final   Comment: (NOTE) Calculated using the CKD-EPI Creatinine Equation (2021)    Anion gap 09/06/2021 8  5 - 15 Final   Performed at [facility], [place] [address] [place], [place] [address]   ABO/RH(D) 09/06/2021 O POS   Final   Antibody Screen 09/06/2021 NEG   Final   Sample Expiration 09/06/2021 09/19/2021,2359   Final   Extend sample reason 09/06/2021 NO TRANSFUSIONS OR PREGNANCY IN THE PAST 3 MONTHS   Final    Unit Number 09/06/2021 [id number]   Final   Blood  Component Type 09/06/2021 RED CELLS,LR   Final   Unit division 09/06/2021 00   Final   Status of Unit 09/06/2021 ISSUED,FINAL   Final   Transfusion Status 09/06/2021 OK TO TRANSFUSE   Final   Crossmatch Result 09/06/2021    Final                   Value:Compatible Performed at [facility], [place] [address] [place], [place] [address]    Unit Number 09/06/2021 [id number]   Final   Blood Component Type 09/06/2021 RED CELLS,LR   Final   Unit division 09/06/2021 00   Final   Status of Unit 09/06/2021 ISSUED,FINAL   Final   Transfusion Status 09/06/2021 OK TO TRANSFUSE   Final   Crossmatch Result 09/06/2021 Compatible   Final   Prothrombin Time 09/06/2021 14.2  11.4 - 15.2 seconds Final   INR 09/06/2021 1.1  0.8 - 1.2 Final   Comment: (NOTE) INR goal varies based on device and disease states. Performed at [facility], [place] [address], [place], [place] [address]    ISSUE DATE / TIME 09/06/2021 [id number]   Final   Blood Product Unit Number 09/06/2021 [id number]   Final   PRODUCT CODE 09/06/2021 [id number]   Final   Unit Type and Rh 09/06/2021 5100   Final   Blood Product Expiration Date 09/06/2021 [id number]   Final   ISSUE DATE / TIME 09/06/2021 [id number]   Final   Blood Product Unit Number 09/06/2021 [id number]   Final   PRODUCT CODE 09/06/2021 [id number]   Final   Unit Type and Rh 09/06/2021 5100   Final   Blood Product Expiration Date 09/06/2021 [id number]   Final  Abstract on 08/30/2021  Component Date Value Ref Range Status   HM Mammogram 08/12/2021 0-4 Bi-Rad  0-4 Bi-Rad, Self Reported Normal Final  Appointment on 08/07/2021  Component Date Value Ref Range Status   Area-P 1/2 08/07/2021 1.83  cm2 Final   S' Lateral 08/07/2021 2.40  cm Final     X-Rays:DG Pelvis Portable  Result Date: 09/16/2021 CLINICAL DATA:  Status post left hip arthroplasty. EXAM: PORTABLE PELVIS  1-2 VIEWS COMPARISON:  Pelvis radiograph 12/14/2019 FINDINGS: Divided AP views of the pelvis obtained in the PACU. Left hip arthroplasty in expected alignment. Linear lucency in the medial femoral cortex adjacent to the femoral stem is likely a nutrient channel, although not definitively present on prior exam. Recent postsurgical change includes air and edema in the soft tissues. IMPRESSION: Left hip arthroplasty with normal alignment. Faint linear cortical lucency in the medial femoral cortex adjacent to the femoral stem is likely a nutrient channel, although not definitively present on prior exam. Recommend continued radiographic follow-up. Electronically Signed   By: Keith Rake M.D.   On: 09/16/2021 17:58   DG C-Arm 1-60 Min-No Report  Result Date: 09/16/2021 CLINICAL DATA:  Elective surgery. LEFT total hip replacement. Fluoroscopy time 9 seconds. EXAM: DG HIP (WITH OR WITHOUT PELVIS) 2-3V LEFT; DG C-ARM 1-60 MIN-NO REPORT COMPARISON:  Abdomen on 03/10/2019 FINDINGS: Seven images are submitted, showing various stages of LEFT total hip arthroplasty. Last image shows normally located hip. Normal appearance of the hardware. IMPRESSION: LEFT total hip arthroplasty. Electronically Signed   By: Nolon Nations M.D.   On: 09/16/2021 17:05   DG C-Arm 1-60 Min-No Report  Result Date: 09/16/2021 CLINICAL DATA:  Elective surgery. LEFT total hip replacement. Fluoroscopy time 9 seconds. EXAM: DG HIP (WITH OR WITHOUT PELVIS) 2-3V LEFT;  DG C-ARM 1-60 MIN-NO REPORT COMPARISON:  Abdomen on 03/10/2019 FINDINGS: Seven images are submitted, showing various stages of LEFT total hip arthroplasty. Last image shows normally located hip. Normal appearance of the hardware. IMPRESSION: LEFT total hip arthroplasty. Electronically Signed   By: Nolon Nations M.D.   On: 09/16/2021 17:05   DG HIP UNILAT WITH PELVIS 2-3 VIEWS LEFT  Result Date: 09/16/2021 CLINICAL DATA:  Elective surgery. LEFT total hip replacement.  Fluoroscopy time 9 seconds. EXAM: DG HIP (WITH OR WITHOUT PELVIS) 2-3V LEFT; DG C-ARM 1-60 MIN-NO REPORT COMPARISON:  Abdomen on 03/10/2019 FINDINGS: Seven images are submitted, showing various stages of LEFT total hip arthroplasty. Last image shows normally located hip. Normal appearance of the hardware. IMPRESSION: LEFT total hip arthroplasty. Electronically Signed   By: Nolon Nations M.D.   On: 09/16/2021 17:05    EKG: Orders placed or performed in visit on 07/29/21   EKG 12-Lead     Hospital Course: [name] is a 75 y.o. who was admitted to [facility]. They were brought to the operating room on 09/16/2021 and underwent Procedure(s): TOTAL HIP ARTHROPLASTY ANTERIOR APPROACH.  Patient tolerated the procedure well and was later transferred to the recovery room and then to the orthopaedic floor for postoperative care. They were given PO and IV analgesics for pain control following their surgery. They were given 24 hours of postoperative antibiotics of  Anti-infectives (From admission, onward)    Start     Dose/Rate Route Frequency Ordered Stop   09/16/21 2200  doxycycline (VIBRA-TABS) tablet 100 mg  Status:  Discontinued        100 mg Oral 2 times daily 09/16/21 1818 09/16/21 1920   09/16/21 2130  ceFAZolin (ANCEF) IVPB 2g/100 mL premix        2 g 200 mL/hr over 30 Minutes Intravenous Every 6 hours 09/16/21 1818 09/17/21 0349   09/16/21 1215  ceFAZolin (ANCEF) IVPB 2g/100 mL premix        2 g 200 mL/hr over 30 Minutes Intravenous On call to O.R. 09/16/21 1204 09/16/21 1538   09/16/21 1215  vancomycin (VANCOCIN) IVPB 1000 mg/200 mL premix        1,000 mg 200 mL/hr over 60 Minutes Intravenous  Once 09/16/21 1204 09/16/21 1444      and started on DVT prophylaxis in the form of  Eliquis .   PT and OT were ordered for total joint protocol. Discharge planning consulted to help with postop disposition and equipment needs. Patient had a good night on the evening of surgery.  They started to get up OOB with therapy on POD #1. Patient's hospital stay was uneventful, but lengthened due to deconditioning. Patient was wheelchair bound prior to surgery due to her horribly arthritic hip, so progression was very slow. She progressed over the remainder of the week and by Monday of the following week (POD #7), she was ready for discharge to home in stable condition. Patient with extensive medical history, but did not run into any complications postoperatively.   Diet: Cardiac diet Activity: WBAT Follow-up: in 2 weeks Disposition: Home with HHPT Discharged Condition: stable    Allergies as of 09/23/2021       Reactions   Gabapentin Other (See Comments)   Dizziness, lighthead        Medication List     STOP taking these medications    doxycycline 100 MG tablet Commonly known as: VIBRA-TABS       TAKE these medications  acetaminophen 650 MG CR tablet Commonly known as: TYLENOL Take 650 mg by mouth every 8 (eight) hours as needed for pain.   albuterol 108 (90 Base) MCG/ACT inhaler Commonly known as: VENTOLIN HFA Inhale 2 puffs into the lungs every 4 (four) hours as needed (shortness of breath, if you can't catch your breath).   apixaban 5 MG Tabs tablet Commonly known as: Eliquis Take 1 tablet (5 mg total) by mouth 2 (two) times daily.   atorvastatin 40 MG tablet Commonly known as: LIPITOR Take 40 mg by mouth daily.   budesonide 0.25 MG/2ML nebulizer solution Commonly known as: Pulmicort One twice daily   CALCIUM PO Take 1 tablet by mouth daily.   clotrimazole-betamethasone cream Commonly known as: LOTRISONE Apply 1 application topically 2 (two) times daily as needed.   cyclobenzaprine 5 MG tablet Commonly known as: FLEXERIL Take 5 mg by mouth at bedtime.   diltiazem 240 MG 24 hr capsule Commonly known as: CARDIZEM CD Take 1 capsule (240 mg total) by mouth daily.   dofetilide 250 MCG capsule Commonly known as: TIKOSYN Take 1  capsule by mouth twice daily   DULoxetine 30 MG capsule Commonly known as: CYMBALTA Take 30 mg by mouth daily. What changed: Another medication with the same name was removed. Continue taking this medication, and follow the directions you see here.   ferrous sulfate 325 (65 FE) MG tablet Take 1 tablet (325 mg total) by mouth daily with breakfast.   formoterol 20 MCG/2ML nebulizer solution Commonly known as: PERFOROMIST Take 2 mLs (20 mcg total) by nebulization 2 (two) times daily. Use in nebulizer twice daily perfectly regularly   golimumab 50 MG/4ML Soln injection Commonly known as: SIMPONI ARIA Inject 50 mg into the vein every 8 (eight) weeks.   HYDROcodone-acetaminophen 5-325 MG tablet Commonly known as: NORCO/VICODIN Take 1-2 tablets by mouth every 6 (six) hours as needed for severe pain.   hydrocortisone 2.5 % cream Apply 1 application topically daily as needed for itching.   hydroxypropyl methylcellulose / hypromellose 2.5 % ophthalmic solution Commonly known as: ISOPTO TEARS / GONIOVISC Place 1 drop into both eyes 3 (three) times daily as needed for dry eyes.   Incruse Ellipta 62.5 MCG/ACT Aepb Generic drug: umeclidinium bromide Inhale 1 puff into the lungs daily.   ketoconazole 2 % cream Commonly known as: NIZORAL Apply 1 application topically daily as needed for irritation.   leflunomide 20 MG tablet Commonly known as: Arava Take 1 tablet (20 mg total) by mouth daily.   levothyroxine 125 MCG tablet Commonly known as: SYNTHROID TAKE 1 TABLET BY MOUTH EVERY DAY   losartan 50 MG tablet Commonly known as: COZAAR Take 1 tablet (50 mg total) by mouth daily.   Magnesium Oxide 400 MG Caps Take 1 capsule (400 mg total) by mouth daily.   montelukast 10 MG tablet Commonly known as: SINGULAIR Take 10 mg by mouth daily.   nitroGLYCERIN 0.4 MG SL tablet Commonly known as: NITROSTAT Place 1 tablet (0.4 mg total) under the tongue every 5 (five) minutes as needed for  chest pain.   nystatin powder Commonly known as: MYCOSTATIN/NYSTOP APPLY 1 APPLICATION        TOPICALLY 3 TIMES A DAY What changed: See the new instructions.   OXYGEN Inhale 2 L into the lungs continuous.   pantoprazole 40 MG tablet Commonly known as: PROTONIX Take 1 tablet (40 mg total) by mouth daily before breakfast.   pregabalin 50 MG capsule Commonly known as: LYRICA Take 50  mg by mouth at bedtime.   torsemide 20 MG tablet Commonly known as: DEMADEX Take 1 tablet (20 mg total) by mouth 2 (two) times daily.   traMADol 50 MG tablet Commonly known as: ULTRAM Take 50 mg by mouth 2 (two) times daily as needed for moderate pain.   triamcinolone cream 0.1 % Commonly known as: KENALOG Apply 1 application topically 2 (two) times daily as needed (for psoriasis).   vitamin B-12 1000 MCG tablet Commonly known as: CYANOCOBALAMIN Take 1,000 mcg by mouth daily.   Vitamin D3 50 MCG (2000 UT) Tabs Take 2,000 Units by mouth daily.        Follow-up Information     Ollen Gross, MD. Schedule an appointment as soon as possible for a visit in 2 week(s).   Specialty: Orthopedic Surgery Contact information: [address] [place] [address] [place] [address] [phone]                 Signed: Arther Abbott, PA-C Orthopedic Surgery 10/02/2021, 12:40 PM

## 2021-10-04 DIAGNOSIS — N1832 Chronic kidney disease, stage 3b: Secondary | ICD-10-CM | POA: Diagnosis not present

## 2021-10-04 DIAGNOSIS — Z96642 Presence of left artificial hip joint: Secondary | ICD-10-CM | POA: Diagnosis not present

## 2021-10-04 DIAGNOSIS — I130 Hypertensive heart and chronic kidney disease with heart failure and stage 1 through stage 4 chronic kidney disease, or unspecified chronic kidney disease: Secondary | ICD-10-CM | POA: Diagnosis not present

## 2021-10-04 DIAGNOSIS — E039 Hypothyroidism, unspecified: Secondary | ICD-10-CM | POA: Diagnosis not present

## 2021-10-04 DIAGNOSIS — Z9981 Dependence on supplemental oxygen: Secondary | ICD-10-CM | POA: Diagnosis not present

## 2021-10-04 DIAGNOSIS — K219 Gastro-esophageal reflux disease without esophagitis: Secondary | ICD-10-CM | POA: Diagnosis not present

## 2021-10-04 DIAGNOSIS — Z951 Presence of aortocoronary bypass graft: Secondary | ICD-10-CM | POA: Diagnosis not present

## 2021-10-04 DIAGNOSIS — J449 Chronic obstructive pulmonary disease, unspecified: Secondary | ICD-10-CM | POA: Diagnosis not present

## 2021-10-04 DIAGNOSIS — I4891 Unspecified atrial fibrillation: Secondary | ICD-10-CM | POA: Diagnosis not present

## 2021-10-04 DIAGNOSIS — J9612 Chronic respiratory failure with hypercapnia: Secondary | ICD-10-CM | POA: Diagnosis not present

## 2021-10-04 DIAGNOSIS — E785 Hyperlipidemia, unspecified: Secondary | ICD-10-CM | POA: Diagnosis not present

## 2021-10-04 DIAGNOSIS — L4050 Arthropathic psoriasis, unspecified: Secondary | ICD-10-CM | POA: Diagnosis not present

## 2021-10-04 DIAGNOSIS — I4892 Unspecified atrial flutter: Secondary | ICD-10-CM | POA: Diagnosis not present

## 2021-10-04 DIAGNOSIS — M5416 Radiculopathy, lumbar region: Secondary | ICD-10-CM | POA: Diagnosis not present

## 2021-10-04 DIAGNOSIS — M069 Rheumatoid arthritis, unspecified: Secondary | ICD-10-CM | POA: Diagnosis not present

## 2021-10-04 DIAGNOSIS — I509 Heart failure, unspecified: Secondary | ICD-10-CM | POA: Diagnosis not present

## 2021-10-04 DIAGNOSIS — Z87891 Personal history of nicotine dependence: Secondary | ICD-10-CM | POA: Diagnosis not present

## 2021-10-04 DIAGNOSIS — Z9181 History of falling: Secondary | ICD-10-CM | POA: Diagnosis not present

## 2021-10-04 DIAGNOSIS — F332 Major depressive disorder, recurrent severe without psychotic features: Secondary | ICD-10-CM | POA: Diagnosis not present

## 2021-10-04 DIAGNOSIS — Z7901 Long term (current) use of anticoagulants: Secondary | ICD-10-CM | POA: Diagnosis not present

## 2021-10-04 DIAGNOSIS — Z471 Aftercare following joint replacement surgery: Secondary | ICD-10-CM | POA: Diagnosis not present

## 2021-10-04 DIAGNOSIS — Z96653 Presence of artificial knee joint, bilateral: Secondary | ICD-10-CM | POA: Diagnosis not present

## 2021-10-04 DIAGNOSIS — K2270 Barrett's esophagus without dysplasia: Secondary | ICD-10-CM | POA: Diagnosis not present

## 2021-10-04 DIAGNOSIS — J9611 Chronic respiratory failure with hypoxia: Secondary | ICD-10-CM | POA: Diagnosis not present

## 2021-10-05 DIAGNOSIS — J9611 Chronic respiratory failure with hypoxia: Secondary | ICD-10-CM | POA: Diagnosis not present

## 2021-10-05 DIAGNOSIS — M2550 Pain in unspecified joint: Secondary | ICD-10-CM | POA: Diagnosis not present

## 2021-10-05 DIAGNOSIS — K219 Gastro-esophageal reflux disease without esophagitis: Secondary | ICD-10-CM | POA: Diagnosis not present

## 2021-10-05 DIAGNOSIS — R062 Wheezing: Secondary | ICD-10-CM | POA: Diagnosis not present

## 2021-10-05 DIAGNOSIS — J90 Pleural effusion, not elsewhere classified: Secondary | ICD-10-CM | POA: Diagnosis not present

## 2021-10-05 DIAGNOSIS — R0902 Hypoxemia: Secondary | ICD-10-CM | POA: Diagnosis not present

## 2021-10-05 DIAGNOSIS — Z20822 Contact with and (suspected) exposure to covid-19: Secondary | ICD-10-CM | POA: Diagnosis not present

## 2021-10-05 DIAGNOSIS — R0689 Other abnormalities of breathing: Secondary | ICD-10-CM | POA: Diagnosis not present

## 2021-10-05 DIAGNOSIS — I480 Paroxysmal atrial fibrillation: Secondary | ICD-10-CM | POA: Diagnosis not present

## 2021-10-05 DIAGNOSIS — Z9981 Dependence on supplemental oxygen: Secondary | ICD-10-CM | POA: Diagnosis not present

## 2021-10-05 DIAGNOSIS — N1832 Chronic kidney disease, stage 3b: Secondary | ICD-10-CM | POA: Diagnosis not present

## 2021-10-05 DIAGNOSIS — M25552 Pain in left hip: Secondary | ICD-10-CM | POA: Diagnosis not present

## 2021-10-05 DIAGNOSIS — R110 Nausea: Secondary | ICD-10-CM | POA: Diagnosis not present

## 2021-10-05 DIAGNOSIS — I130 Hypertensive heart and chronic kidney disease with heart failure and stage 1 through stage 4 chronic kidney disease, or unspecified chronic kidney disease: Secondary | ICD-10-CM | POA: Diagnosis not present

## 2021-10-05 DIAGNOSIS — Z951 Presence of aortocoronary bypass graft: Secondary | ICD-10-CM | POA: Diagnosis not present

## 2021-10-05 DIAGNOSIS — M9702XA Periprosthetic fracture around internal prosthetic left hip joint, initial encounter: Secondary | ICD-10-CM | POA: Diagnosis not present

## 2021-10-05 DIAGNOSIS — J441 Chronic obstructive pulmonary disease with (acute) exacerbation: Secondary | ICD-10-CM | POA: Diagnosis not present

## 2021-10-05 DIAGNOSIS — R000 Tachycardia, unspecified: Secondary | ICD-10-CM | POA: Diagnosis not present

## 2021-10-05 DIAGNOSIS — I2510 Atherosclerotic heart disease of native coronary artery without angina pectoris: Secondary | ICD-10-CM | POA: Diagnosis not present

## 2021-10-05 DIAGNOSIS — E039 Hypothyroidism, unspecified: Secondary | ICD-10-CM | POA: Diagnosis not present

## 2021-10-05 DIAGNOSIS — Z791 Long term (current) use of non-steroidal anti-inflammatories (NSAID): Secondary | ICD-10-CM | POA: Diagnosis not present

## 2021-10-05 DIAGNOSIS — Z87891 Personal history of nicotine dependence: Secondary | ICD-10-CM | POA: Diagnosis not present

## 2021-10-05 DIAGNOSIS — R0602 Shortness of breath: Secondary | ICD-10-CM | POA: Diagnosis not present

## 2021-10-05 DIAGNOSIS — R0600 Dyspnea, unspecified: Secondary | ICD-10-CM | POA: Diagnosis not present

## 2021-10-05 DIAGNOSIS — Z96642 Presence of left artificial hip joint: Secondary | ICD-10-CM | POA: Diagnosis not present

## 2021-10-05 DIAGNOSIS — I959 Hypotension, unspecified: Secondary | ICD-10-CM | POA: Diagnosis not present

## 2021-10-05 DIAGNOSIS — Z7951 Long term (current) use of inhaled steroids: Secondary | ICD-10-CM | POA: Diagnosis not present

## 2021-10-05 DIAGNOSIS — Z79899 Other long term (current) drug therapy: Secondary | ICD-10-CM | POA: Diagnosis not present

## 2021-10-05 DIAGNOSIS — Z7901 Long term (current) use of anticoagulants: Secondary | ICD-10-CM | POA: Diagnosis not present

## 2021-10-05 DIAGNOSIS — J810 Acute pulmonary edema: Secondary | ICD-10-CM | POA: Diagnosis not present

## 2021-10-05 DIAGNOSIS — I5032 Chronic diastolic (congestive) heart failure: Secondary | ICD-10-CM | POA: Diagnosis not present

## 2021-10-05 DIAGNOSIS — J9612 Chronic respiratory failure with hypercapnia: Secondary | ICD-10-CM | POA: Diagnosis not present

## 2021-10-05 DIAGNOSIS — R918 Other nonspecific abnormal finding of lung field: Secondary | ICD-10-CM | POA: Diagnosis not present

## 2021-10-05 DIAGNOSIS — Z886 Allergy status to analgesic agent status: Secondary | ICD-10-CM | POA: Diagnosis not present

## 2021-10-06 ENCOUNTER — Encounter (HOSPITAL_COMMUNITY): Payer: Self-pay | Admitting: Family Medicine

## 2021-10-06 ENCOUNTER — Inpatient Hospital Stay (HOSPITAL_COMMUNITY)
Admission: AD | Admit: 2021-10-06 | Discharge: 2021-10-16 | DRG: 901 | Disposition: A | Discharge status: Home Health | Payer: Medicare Other | Source: Other Acute Inpatient Hospital | Attending: Internal Medicine | Admitting: Internal Medicine

## 2021-10-06 DIAGNOSIS — Z471 Aftercare following joint replacement surgery: Secondary | ICD-10-CM | POA: Diagnosis not present

## 2021-10-06 DIAGNOSIS — K219 Gastro-esophageal reflux disease without esophagitis: Secondary | ICD-10-CM | POA: Diagnosis present

## 2021-10-06 DIAGNOSIS — S72302A Unspecified fracture of shaft of left femur, initial encounter for closed fracture: Secondary | ICD-10-CM | POA: Diagnosis not present

## 2021-10-06 DIAGNOSIS — J9611 Chronic respiratory failure with hypoxia: Secondary | ICD-10-CM | POA: Diagnosis present

## 2021-10-06 DIAGNOSIS — R0902 Hypoxemia: Secondary | ICD-10-CM | POA: Diagnosis not present

## 2021-10-06 DIAGNOSIS — Z9981 Dependence on supplemental oxygen: Secondary | ICD-10-CM | POA: Diagnosis not present

## 2021-10-06 DIAGNOSIS — L7634 Postprocedural seroma of skin and subcutaneous tissue following other procedure: Principal | ICD-10-CM | POA: Diagnosis present

## 2021-10-06 DIAGNOSIS — M7989 Other specified soft tissue disorders: Secondary | ICD-10-CM | POA: Diagnosis present

## 2021-10-06 DIAGNOSIS — I5032 Chronic diastolic (congestive) heart failure: Secondary | ICD-10-CM | POA: Diagnosis present

## 2021-10-06 DIAGNOSIS — Y848 Other medical procedures as the cause of abnormal reaction of the patient, or of later complication, without mention of misadventure at the time of the procedure: Secondary | ICD-10-CM | POA: Diagnosis present

## 2021-10-06 DIAGNOSIS — T888XXA Other specified complications of surgical and medical care, not elsewhere classified, initial encounter: Secondary | ICD-10-CM | POA: Diagnosis present

## 2021-10-06 DIAGNOSIS — F32A Depression, unspecified: Secondary | ICD-10-CM | POA: Diagnosis present

## 2021-10-06 DIAGNOSIS — M069 Rheumatoid arthritis, unspecified: Secondary | ICD-10-CM | POA: Diagnosis present

## 2021-10-06 DIAGNOSIS — I517 Cardiomegaly: Secondary | ICD-10-CM | POA: Diagnosis not present

## 2021-10-06 DIAGNOSIS — Z96653 Presence of artificial knee joint, bilateral: Secondary | ICD-10-CM | POA: Diagnosis present

## 2021-10-06 DIAGNOSIS — S72002A Fracture of unspecified part of neck of left femur, initial encounter for closed fracture: Secondary | ICD-10-CM | POA: Diagnosis not present

## 2021-10-06 DIAGNOSIS — Z6841 Body Mass Index (BMI) 40.0 and over, adult: Secondary | ICD-10-CM

## 2021-10-06 DIAGNOSIS — M9702XA Periprosthetic fracture around internal prosthetic left hip joint, initial encounter: Secondary | ICD-10-CM | POA: Diagnosis present

## 2021-10-06 DIAGNOSIS — I4891 Unspecified atrial fibrillation: Secondary | ICD-10-CM | POA: Diagnosis not present

## 2021-10-06 DIAGNOSIS — Z87891 Personal history of nicotine dependence: Secondary | ICD-10-CM | POA: Diagnosis not present

## 2021-10-06 DIAGNOSIS — S72112A Displaced fracture of greater trochanter of left femur, initial encounter for closed fracture: Secondary | ICD-10-CM | POA: Diagnosis present

## 2021-10-06 DIAGNOSIS — I2510 Atherosclerotic heart disease of native coronary artery without angina pectoris: Secondary | ICD-10-CM

## 2021-10-06 DIAGNOSIS — S7002XD Contusion of left hip, subsequent encounter: Secondary | ICD-10-CM | POA: Diagnosis not present

## 2021-10-06 DIAGNOSIS — R279 Unspecified lack of coordination: Secondary | ICD-10-CM | POA: Diagnosis not present

## 2021-10-06 DIAGNOSIS — S7002XA Contusion of left hip, initial encounter: Secondary | ICD-10-CM

## 2021-10-06 DIAGNOSIS — N179 Acute kidney failure, unspecified: Secondary | ICD-10-CM | POA: Diagnosis not present

## 2021-10-06 DIAGNOSIS — I482 Chronic atrial fibrillation, unspecified: Secondary | ICD-10-CM | POA: Diagnosis present

## 2021-10-06 DIAGNOSIS — M25552 Pain in left hip: Secondary | ICD-10-CM | POA: Diagnosis not present

## 2021-10-06 DIAGNOSIS — Z96649 Presence of unspecified artificial hip joint: Secondary | ICD-10-CM | POA: Diagnosis not present

## 2021-10-06 DIAGNOSIS — N1832 Chronic kidney disease, stage 3b: Secondary | ICD-10-CM | POA: Diagnosis present

## 2021-10-06 DIAGNOSIS — M978XXA Periprosthetic fracture around other internal prosthetic joint, initial encounter: Secondary | ICD-10-CM

## 2021-10-06 DIAGNOSIS — I130 Hypertensive heart and chronic kidney disease with heart failure and stage 1 through stage 4 chronic kidney disease, or unspecified chronic kidney disease: Secondary | ICD-10-CM | POA: Diagnosis not present

## 2021-10-06 DIAGNOSIS — X58XXXA Exposure to other specified factors, initial encounter: Secondary | ICD-10-CM | POA: Diagnosis present

## 2021-10-06 DIAGNOSIS — R0602 Shortness of breath: Secondary | ICD-10-CM | POA: Diagnosis not present

## 2021-10-06 DIAGNOSIS — Z7901 Long term (current) use of anticoagulants: Secondary | ICD-10-CM

## 2021-10-06 DIAGNOSIS — I252 Old myocardial infarction: Secondary | ICD-10-CM

## 2021-10-06 DIAGNOSIS — M96840 Postprocedural hematoma of a musculoskeletal structure following a musculoskeletal system procedure: Secondary | ICD-10-CM | POA: Diagnosis not present

## 2021-10-06 DIAGNOSIS — Z803 Family history of malignant neoplasm of breast: Secondary | ICD-10-CM

## 2021-10-06 DIAGNOSIS — E785 Hyperlipidemia, unspecified: Secondary | ICD-10-CM | POA: Diagnosis present

## 2021-10-06 DIAGNOSIS — I4892 Unspecified atrial flutter: Secondary | ICD-10-CM | POA: Diagnosis not present

## 2021-10-06 DIAGNOSIS — Z20822 Contact with and (suspected) exposure to covid-19: Secondary | ICD-10-CM | POA: Diagnosis not present

## 2021-10-06 DIAGNOSIS — J9612 Chronic respiratory failure with hypercapnia: Secondary | ICD-10-CM | POA: Diagnosis not present

## 2021-10-06 DIAGNOSIS — J449 Chronic obstructive pulmonary disease, unspecified: Secondary | ICD-10-CM | POA: Diagnosis present

## 2021-10-06 DIAGNOSIS — Z951 Presence of aortocoronary bypass graft: Secondary | ICD-10-CM

## 2021-10-06 DIAGNOSIS — D631 Anemia in chronic kidney disease: Secondary | ICD-10-CM | POA: Diagnosis not present

## 2021-10-06 DIAGNOSIS — R0600 Dyspnea, unspecified: Secondary | ICD-10-CM | POA: Diagnosis not present

## 2021-10-06 DIAGNOSIS — Z743 Need for continuous supervision: Secondary | ICD-10-CM | POA: Diagnosis not present

## 2021-10-06 DIAGNOSIS — J8410 Pulmonary fibrosis, unspecified: Secondary | ICD-10-CM | POA: Diagnosis present

## 2021-10-06 DIAGNOSIS — R110 Nausea: Secondary | ICD-10-CM | POA: Diagnosis not present

## 2021-10-06 DIAGNOSIS — E039 Hypothyroidism, unspecified: Secondary | ICD-10-CM | POA: Diagnosis not present

## 2021-10-06 DIAGNOSIS — R52 Pain, unspecified: Secondary | ICD-10-CM

## 2021-10-06 DIAGNOSIS — J441 Chronic obstructive pulmonary disease with (acute) exacerbation: Secondary | ICD-10-CM | POA: Diagnosis not present

## 2021-10-06 DIAGNOSIS — G4733 Obstructive sleep apnea (adult) (pediatric): Secondary | ICD-10-CM | POA: Diagnosis present

## 2021-10-06 DIAGNOSIS — I480 Paroxysmal atrial fibrillation: Secondary | ICD-10-CM | POA: Diagnosis not present

## 2021-10-06 DIAGNOSIS — Z8249 Family history of ischemic heart disease and other diseases of the circulatory system: Secondary | ICD-10-CM

## 2021-10-06 DIAGNOSIS — Z66 Do not resuscitate: Secondary | ICD-10-CM | POA: Diagnosis not present

## 2021-10-06 DIAGNOSIS — J849 Interstitial pulmonary disease, unspecified: Secondary | ICD-10-CM | POA: Diagnosis not present

## 2021-10-06 DIAGNOSIS — Z96642 Presence of left artificial hip joint: Secondary | ICD-10-CM | POA: Diagnosis not present

## 2021-10-06 DIAGNOSIS — R918 Other nonspecific abnormal finding of lung field: Secondary | ICD-10-CM | POA: Diagnosis not present

## 2021-10-06 MED ORDER — LEFLUNOMIDE 20 MG PO TABS
20.0000 mg | ORAL_TABLET | Freq: Every day | ORAL | Status: DC
  Administered 2021-10-07 – 2021-10-15 (×9): 20 mg via ORAL
  Filled 2021-10-06 (×11): qty 1

## 2021-10-06 MED ORDER — UMECLIDINIUM BROMIDE 62.5 MCG/ACT IN AEPB
1.0000 | INHALATION_SPRAY | Freq: Every day | RESPIRATORY_TRACT | Status: DC
  Administered 2021-10-07 – 2021-10-16 (×10): 1 via RESPIRATORY_TRACT
  Filled 2021-10-06 (×2): qty 7

## 2021-10-06 MED ORDER — SODIUM CHLORIDE 0.9 % IV SOLN: 250.0000 mL | INTRAVENOUS | Status: DC | PRN

## 2021-10-06 MED ORDER — PANTOPRAZOLE SODIUM 40 MG PO TBEC
40.0000 mg | DELAYED_RELEASE_TABLET | Freq: Every day | ORAL | Status: DC
  Administered 2021-10-07 – 2021-10-16 (×10): 40 mg via ORAL
  Filled 2021-10-06 (×10): qty 1

## 2021-10-06 MED ORDER — LEVOTHYROXINE SODIUM 25 MCG PO TABS
125.0000 ug | ORAL_TABLET | Freq: Every day | ORAL | Status: DC
  Administered 2021-10-07 – 2021-10-16 (×10): 125 ug via ORAL
  Filled 2021-10-06 (×10): qty 1

## 2021-10-06 MED ORDER — ALBUTEROL SULFATE (2.5 MG/3ML) 0.083% IN NEBU
3.0000 mL | INHALATION_SOLUTION | RESPIRATORY_TRACT | Status: DC | PRN
  Administered 2021-10-07 – 2021-10-09 (×3): 3 mL via RESPIRATORY_TRACT
  Filled 2021-10-06 (×3): qty 3

## 2021-10-06 MED ORDER — TRAMADOL HCL 50 MG PO TABS
50.0000 mg | ORAL_TABLET | Freq: Two times a day (BID) | ORAL | Status: DC | PRN
  Administered 2021-10-10 – 2021-10-11 (×2): 50 mg via ORAL
  Filled 2021-10-06 (×2): qty 1

## 2021-10-06 MED ORDER — ATORVASTATIN CALCIUM 40 MG PO TABS
40.0000 mg | ORAL_TABLET | Freq: Every day | ORAL | Status: DC
  Administered 2021-10-07 – 2021-10-16 (×10): 40 mg via ORAL
  Filled 2021-10-06 (×10): qty 1

## 2021-10-06 MED ORDER — CYCLOBENZAPRINE HCL 5 MG PO TABS: 5.0000 mg | ORAL_TABLET | Freq: Every evening | ORAL | Status: DC | PRN

## 2021-10-06 MED ORDER — MONTELUKAST SODIUM 10 MG PO TABS
10.0000 mg | ORAL_TABLET | Freq: Every day | ORAL | Status: DC
  Administered 2021-10-07 – 2021-10-16 (×10): 10 mg via ORAL
  Filled 2021-10-06 (×10): qty 1

## 2021-10-06 MED ORDER — DOFETILIDE 250 MCG PO CAPS
250.0000 ug | ORAL_CAPSULE | Freq: Two times a day (BID) | ORAL | Status: DC
  Administered 2021-10-07 – 2021-10-16 (×18): 250 ug via ORAL
  Filled 2021-10-06 (×19): qty 1

## 2021-10-06 MED ORDER — ARFORMOTEROL TARTRATE 15 MCG/2ML IN NEBU
15.0000 ug | INHALATION_SOLUTION | Freq: Two times a day (BID) | RESPIRATORY_TRACT | Status: DC
  Administered 2021-10-07 – 2021-10-16 (×19): 15 ug via RESPIRATORY_TRACT
  Filled 2021-10-06 (×20): qty 2

## 2021-10-06 MED ORDER — SODIUM CHLORIDE 0.9% FLUSH
3.0000 mL | Freq: Two times a day (BID) | INTRAVENOUS | Status: DC
  Administered 2021-10-07 – 2021-10-15 (×13): 3 mL via INTRAVENOUS

## 2021-10-06 MED ORDER — DILTIAZEM HCL ER COATED BEADS 240 MG PO CP24
240.0000 mg | ORAL_CAPSULE | Freq: Every day | ORAL | Status: DC
  Administered 2021-10-07 – 2021-10-16 (×10): 240 mg via ORAL
  Filled 2021-10-06 (×10): qty 1

## 2021-10-06 MED ORDER — PREGABALIN 50 MG PO CAPS
50.0000 mg | ORAL_CAPSULE | Freq: Every day | ORAL | Status: DC
  Administered 2021-10-07 – 2021-10-15 (×10): 50 mg via ORAL
  Filled 2021-10-06 (×10): qty 1

## 2021-10-06 MED ORDER — DULOXETINE HCL 30 MG PO CPEP
30.0000 mg | ORAL_CAPSULE | Freq: Every day | ORAL | Status: DC
  Administered 2021-10-07 – 2021-10-16 (×10): 30 mg via ORAL
  Filled 2021-10-06 (×10): qty 1

## 2021-10-06 MED ORDER — BUDESONIDE 0.25 MG/2ML IN SUSP
0.2500 mg | Freq: Two times a day (BID) | RESPIRATORY_TRACT | Status: DC
  Administered 2021-10-07 – 2021-10-16 (×18): 0.25 mg via RESPIRATORY_TRACT
  Filled 2021-10-06 (×20): qty 2

## 2021-10-06 MED ORDER — SENNOSIDES-DOCUSATE SODIUM 8.6-50 MG PO TABS
1.0000 | ORAL_TABLET | Freq: Every evening | ORAL | Status: DC | PRN
  Administered 2021-10-07: 07:00:00 1 via ORAL
  Filled 2021-10-06: qty 1

## 2021-10-06 MED ORDER — SODIUM CHLORIDE 0.9% FLUSH: 3.0000 mL | INTRAVENOUS | Status: DC | PRN

## 2021-10-06 MED ORDER — ACETAMINOPHEN 325 MG PO TABS
650.0000 mg | ORAL_TABLET | Freq: Four times a day (QID) | ORAL | Status: DC | PRN
  Administered 2021-10-10: 650 mg via ORAL
  Filled 2021-10-06: qty 2

## 2021-10-06 MED ORDER — ACETAMINOPHEN 650 MG RE SUPP: 650.0000 mg | Freq: Four times a day (QID) | RECTAL | Status: DC | PRN

## 2021-10-06 NOTE — H&P (Signed)
History and Physical    [name] ZOX:[id number] DOB: Sep 27, 1946 DOA: 10/06/2021  PCP: [name], Amber Patricia, MD   Patient coming from: [facility] (had been living at home)  Chief Complaint: Left hip pain   HPI: [name] is a very pleasant 75 y.o. female with medical history significant for CAD, HFpEF, atrial fibrillation on Eliquis, CKD IIIb, ILD, COPD, OSA, chronic hypoxic and hypercarbic respiratory failure, RA, and osteoarthritis status post left total hip arthroplasty on 09/16/2021 who presented to [facility] on 10/05/2021 with increasing left hip pain.  Patient reports that she was ambulating and pain was well controlled at time of her hospital discharge on 09/23/2021.  She developed insidiously worsening pain at home following the discharge and was becoming less and less mobile due to this.  Her daughter was helping her back at home after the surgery and reports seeing only a tiny amount of yellow drainage from the superior aspect of the incision when she change the bandages prior to presenting to [place].  Patient denies any fevers or chills, and denies any recent increase in chronic cough or dyspnea.  [facility] Course: Upon arrival to the ED, patient is found to have a temperature of 100.1 F with slightly elevated heart rate.  She had a CT of the left hip concerning for periprosthetic intertrochanteric left femur fracture and periprosthetic fluid collections.  She had a CTA chest is negative for PE and notable for chronic appearing interstitial lung disease.  WBC was 13,500, hemoglobin 8.6, creatinine 1.60, procalcitonin 0.27, and influenza and COVID PCR were negative.  She had an elevated proBNP and mild elevation in troponin which decreased on repeat.  She was given IV vancomycin and Zosn and IV Lasix.    There was discussion between orthopedic surgery at [facility] and surgeon covering for Dr. Lequita Halt who performed the patient's  THA, and patient was transferred to medical service at [facility] where orthopedic surgery will see her in consultation.   Review of Systems:  All other systems reviewed and apart from HPI, are negative.  Past Medical History:  Diagnosis Date   Anemia    Arthritis    OA RIGHT KNEE WITH PAIN   Barrett esophagus    BiPAP (biphasic positive airway pressure)    Wears at night   Bradycardia 06/01/2015   CAD in native artery    a. NSTEMI 05/2015 s/p emergent CABG.   Chronic diastolic (congestive) heart failure (HCC)    Chronic kidney disease, stage 3a (HCC)    Chronic respiratory failure (HCC)    Chronic respiratory failure with hypoxia and hypercapnia (HCC) 02/04/2010   Followed in Pulmonary clinic/ [facility]/ [place]       - 02 dependent  since 07/02/10 >>  83% RA December 05, 2010       - ONO RA 08/05/12  :  Positive sat < 89 x 2:3m> repeat on 2lpm rec 08/12/2012  - 06/17/2013 reported desat with activity p Knee surgery > rec restart 2lpm with activity  - 06/27/2013   Walked 2lpm  x one lap @ 185 stopped due to sat 88% not sob , desat to 82% on RA just at th   COPD III spirometry if use FEV1/VC p saba  07/18/2010   Quit smoking May 2006       - PFT's  04/12/10 FEV1  1.21 (69%) ratio 77 and no change p B2,  DLC0 56%   VC 70%         -  PFTs  08/08/2013 FEV1 1.21 (60%) ratio 86 and no change p B2 DLCO 79%  VC 72%  On symbicort 160 2bid  - PFT's  02/08/2018  FEV1 0.70 (40 % ) ratio 56 if use FEV1/VC  p 38 % improvement from saba p symb 160 prior to study with DLCO  78 % corrects to 147  % for alv volume   - 02/08/2018   Cough variant asthma 02/26/2011   Followed in Pulmonary clinic/ [facility]/ [place]  - PFT's  06/04/15  FEV1 1.20 (67 % ) ratio 83  p 6 % improvement from saba with DLCO  80 % corrects to 132 % for alv volume      - Clinical dx based on response to symbicort       FENO 09/16/2016  =   96 on symbicort 160 2bid > added singulair  Allergy profile 09/16/2016 >  Eos 0.5 /  IgE  78 neg RAST  -   Referred to rehab 04/29/2017 > completed   Depression    Dysrhythmia    Afib   Essential hypertension 04/20/2007   Qualifier: Diagnosis of  By: Paulina Fusi, RN, Daine Gravel    GERD (gastroesophageal reflux disease)    History of ARDS 2006   History of home oxygen therapy    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 small intracranial hemorrhage in setting of HTN.   Mild carotid artery disease ([place])    a. Duplex 1-39% bilaterally in 2016.   Morbid (severe) obesity due to excess calories ([place]) 04/22/2015   pfts with erv 14% 06/04/15  And 33% 02/08/2018    NSTEMI (non-ST elevated myocardial infarction) ([place]) 05/31/2015   Pneumococcal pneumonia ([place]) 2006   HOSPITALIZED AND DEVELOPED ARDS   Psoriasis    Psoriatic arthritis ([place])    PULMONARY FIBROSIS ILD POST INFLAMMATORY CHRONIC 07/18/2010   Followed as Primary Care Patient/ [facility]/ [place]  -s/p ARDS 2006 with bacteremic S  Pna       - CT chest 07/03/10 Nonspecific PF mostly upper lobes       - CT chest 12/03/10 acute gg changes and effusions c/w chf - PFT's  02/08/2018  FVC 0.64 (28 %)   with DLCO  78 % corrects to 147 % for alv volume      Rhematoid arthritis    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   SOB (shortness of breath) on exertion     Past Surgical History:  Procedure Laterality Date   ABDOMINOPLASTY     CARDIAC CATHETERIZATION N/A 06/01/2015   Procedure: Left Heart Cath and Coronary Angiography;  Surgeon: Lorretta Harp, MD;  Location: [facility] LAB;  Service: Cardiovascular;  Laterality: N/A;   CARDIOVERSION N/A 02/12/2021   Procedure: CARDIOVERSION;  Surgeon: Freada Bergeron, MD;  Location[facility] ENDOSCOPY;  Service: Cardiovascular;  Laterality: N/A;   CARPAL TUNNEL RELEASE     CHOLECYSTECTOMY     CORONARY ARTERY BYPASS GRAFT N/A 06/04/2015   Procedure: CORONARY ARTERY BYPASS GRAFT times three            with left internal mammary artery and right  leg saphenous vein;  Surgeon: Gaye Pollack, MD;  Location: [facility] OR;  Service: Open Heart Surgery;  Laterality: N/A;   cosmetic breast surgery     EYE SURGERY     cataract   JOINT REPLACEMENT  KNEE ARTHROSCOPY Left    TEE WITHOUT CARDIOVERSION  06/04/2015   Procedure: TRANSESOPHAGEAL ECHOCARDIOGRAM (TEE);  Surgeon: Gaye Pollack, MD;  Location: [facility] OR;  Service: Open Heart Surgery;;   TEE WITHOUT CARDIOVERSION N/A 02/12/2021   Procedure: TRANSESOPHAGEAL ECHOCARDIOGRAM (TEE);  Surgeon: Freada Bergeron, MD;  Location: [facility] ENDOSCOPY;  Service: Cardiovascular;  Laterality: N/A;   TOTAL HIP ARTHROPLASTY Left 09/16/2021   Procedure: TOTAL HIP ARTHROPLASTY ANTERIOR APPROACH;  Surgeon: Gaynelle Arabian, MD;  Location: [facility] ORS;  Service: Orthopedics;  Laterality: Left;   TOTAL KNEE ARTHROPLASTY Left    TOTAL KNEE ARTHROPLASTY Right 06/06/2013   Procedure: RIGHT TOTAL KNEE ARTHROPLASTY;  Surgeon: Gearlean Alf, MD;  Location: [facility] ORS;  Service: Orthopedics;  Laterality: Right;    Social History:   reports that she quit smoking about 16 years ago. Her smoking use included cigarettes. She has a 87.75 pack-year smoking history. She has never used smokeless tobacco. She reports that she does not drink alcohol and does not use drugs.  Allergies  Allergen Reactions   Gabapentin Other (See Comments)    Dizziness, lighthead    Family History  Problem Relation Age of Onset   Breast cancer Mother    Coronary artery disease Father    Rheum arthritis Father      Prior to Admission medications   Medication Sig Start Date End Date Taking? Authorizing Provider  acetaminophen (TYLENOL) 650 MG CR tablet Take 650 mg by mouth every 8 (eight) hours as needed for pain.    [provider]  albuterol (VENTOLIN HFA) 108 (90 Base) MCG/ACT inhaler Inhale 2 puffs into the lungs every 4 (four) hours as needed (shortness of breath, if you can't catch your breath). 06/04/20   Tanda Rockers, MD  apixaban  (ELIQUIS) 5 MG TABS tablet Take 1 tablet (5 mg total) by mouth 2 (two) times daily. 06/17/21   Fenton, Clint R, PA  atorvastatin (LIPITOR) 40 MG tablet Take 40 mg by mouth daily.    [provider]  budesonide (PULMICORT) 0.25 MG/2ML nebulizer solution One twice daily 07/02/20   Tanda Rockers, MD  CALCIUM PO Take 1 tablet by mouth daily.    [provider]  Cholecalciferol (VITAMIN D3) 2000 UNITS TABS Take 2,000 Units by mouth daily.    [provider]  clotrimazole-betamethasone (LOTRISONE) cream Apply 1 application topically 2 (two) times daily as needed. 05/29/21   Martinique, Betty G, MD  cyclobenzaprine (FLEXERIL) 5 MG tablet Take 5 mg by mouth at bedtime.    [provider]  diltiazem (CARDIZEM CD) 240 MG 24 hr capsule Take 1 capsule (240 mg total) by mouth daily. 09/11/21   Werner Lean, MD  dofetilide (TIKOSYN) 250 MCG capsule Take 1 capsule by mouth twice daily 08/20/21   Shirley Friar, PA-C  DULoxetine (CYMBALTA) 30 MG capsule Take 30 mg by mouth daily.    [provider]  ferrous sulfate 325 (65 FE) MG tablet Take 1 tablet (325 mg total) by mouth daily with breakfast. 01/10/21 09/03/21  Chandrasekhar, Lyda Kalata A, MD  formoterol (PERFOROMIST) 20 MCG/2ML nebulizer solution Take 2 mLs (20 mcg total) by nebulization 2 (two) times daily. Use in nebulizer twice daily perfectly regularly 07/02/20   Tanda Rockers, MD  golimumab (SIMPONI ARIA) 50 MG/4ML SOLN injection Inject 50 mg into the vein every 8 (eight) weeks.     [provider]  HYDROcodone-acetaminophen (NORCO/VICODIN) 5-325 MG tablet Take 1-2 tablets by mouth every 6 (six)  hours as needed for severe pain. 09/23/21   Edmisten, Kristie L, PA  hydrocortisone 2.5 % cream Apply 1 application topically daily as needed for itching.    [provider]  hydroxypropyl methylcellulose / hypromellose (ISOPTO TEARS / GONIOVISC) 2.5 % ophthalmic solution Place 1 drop into both  eyes 3 (three) times daily as needed for dry eyes.    [provider]  ketoconazole (NIZORAL) 2 % cream Apply 1 application topically daily as needed for irritation. 01/30/21   [provider]  leflunomide (ARAVA) 20 MG tablet Take 1 tablet (20 mg total) by mouth daily. 03/15/19   [name], Amber Patricia, MD  levothyroxine (SYNTHROID) 125 MCG tablet TAKE 1 TABLET BY MOUTH EVERY DAY 06/20/20   [name], Amber Patricia, MD  losartan (COZAAR) 50 MG tablet Take 1 tablet (50 mg total) by mouth daily. 01/17/21   Christell Constant, MD  Magnesium Oxide 400 MG CAPS Take 1 capsule (400 mg total) by mouth daily. 06/06/21   Chandrasekhar, Mahesh A, MD  montelukast (SINGULAIR) 10 MG tablet Take 10 mg by mouth daily. 03/22/21   [provider]  nitroGLYCERIN (NITROSTAT) 0.4 MG SL tablet Place 1 tablet (0.4 mg total) under the tongue every 5 (five) minutes as needed for chest pain. 05/24/21   Chandrasekhar, Lafayette Dragon A, MD  nystatin (MYCOSTATIN/NYSTOP) powder APPLY 1 APPLICATION        TOPICALLY 3 TIMES A DAY Patient taking differently: Apply 1 application topically daily as needed (psoriasis). 03/07/21   [name], Amber Patricia, MD  OXYGEN Inhale 2 L into the lungs continuous.    [provider]  pantoprazole (PROTONIX) 40 MG tablet Take 1 tablet (40 mg total) by mouth daily before breakfast. 01/10/21   Nyoka Cowden, MD  pregabalin (LYRICA) 50 MG capsule Take 50 mg by mouth at bedtime. 03/13/21   [provider]  torsemide (DEMADEX) 20 MG tablet Take 1 tablet (20 mg total) by mouth 2 (two) times daily. 06/21/21   Narda Bonds, MD  traMADol (ULTRAM) 50 MG tablet Take 50 mg by mouth 2 (two) times daily as needed for moderate pain. 06/26/21   [provider]  triamcinolone cream (KENALOG) 0.1 % Apply 1 application topically 2 (two) times daily as needed (for psoriasis).     [provider]  umeclidinium bromide (INCRUSE ELLIPTA) 62.5 MCG/INH AEPB  Inhale 1 puff into the lungs daily. 03/14/21   Nyoka Cowden, MD  vitamin B-12 (CYANOCOBALAMIN) 1000 MCG tablet Take 1,000 mcg by mouth daily.    [provider]    Physical Exam: Vitals:   10/06/21 2220  BP: 122/72  Pulse: 86  Resp: 19  Temp: 99.3 F (37.4 C)  TempSrc: Oral  SpO2: 98%    Constitutional: NAD, calm  Eyes: PERTLA, lids and conjunctivae normal ENMT: Mucous membranes are moist. Posterior pharynx clear of any exudate or lesions.   Neck: supple, no masses  Respiratory: no wheezing, no crackles. No accessory muscle use.  Cardiovascular: S1 & S2 heard, regular rate and rhythm. No significant JVD. Abdomen: No distension, no tenderness, soft. Bowel sounds active.  Musculoskeletal: no clubbing / cyanosis. No joint deformity upper and lower extremities.   Skin: mild erythema and induration about the left hip incision without dehiscence or drainage. Warm, dry, well-perfused. Neurologic: CN 2-12 grossly intact. Moving all extremities. Alert and oriented.  Psychiatric: Very pleasant. Cooperative.    Labs and Imaging on Admission: I have personally reviewed following labs and imaging  studies  CBC: No results for input(s): WBC, NEUTROABS, HGB, HCT, MCV, PLT in the last 168 hours. Basic Metabolic Panel: No results for input(s): NA, K, CL, CO2, GLUCOSE, BUN, CREATININE, CALCIUM, MG, PHOS in the last 168 hours. GFR: CrCl cannot be calculated (Unknown ideal weight.). Liver Function Tests: No results for input(s): AST, ALT, ALKPHOS, BILITOT, PROT, ALBUMIN in the last 168 hours. No results for input(s): LIPASE, AMYLASE in the last 168 hours. No results for input(s): AMMONIA in the last 168 hours. Coagulation Profile: No results for input(s): INR, PROTIME in the last 168 hours. Cardiac Enzymes: No results for input(s): CKTOTAL, CKMB, CKMBINDEX, TROPONINI in the last 168 hours. BNP (last 3 results) Recent Labs    06/13/21 1303  PROBNP 588*   HbA1C: No results for  input(s): HGBA1C in the last 72 hours. CBG: No results for input(s): GLUCAP in the last 168 hours. Lipid Profile: No results for input(s): CHOL, HDL, LDLCALC, TRIG, CHOLHDL, LDLDIRECT in the last 72 hours. Thyroid Function Tests: No results for input(s): TSH, T4TOTAL, FREET4, T3FREE, THYROIDAB in the last 72 hours. Anemia Panel: No results for input(s): VITAMINB12, FOLATE, FERRITIN, TIBC, IRON, RETICCTPCT in the last 72 hours. Urine analysis:    Component Value Date/Time   COLORURINE YELLOW 06/18/2021 0457   APPEARANCEUR HAZY (A) 06/18/2021 0457   LABSPEC 1.014 06/18/2021 0457   PHURINE 5.0 06/18/2021 0457   GLUCOSEU NEGATIVE 06/18/2021 0457   GLUCOSEU NEGATIVE 05/29/2021 1557   HGBUR NEGATIVE 06/18/2021 0457   HGBUR negative 07/18/2010 0941   BILIRUBINUR NEGATIVE 06/18/2021 0457   BILIRUBINUR n 08/10/2012 1131   KETONESUR NEGATIVE 06/18/2021 0457   PROTEINUR NEGATIVE 06/18/2021 0457   UROBILINOGEN 0.2 05/29/2021 1557   NITRITE NEGATIVE 06/18/2021 0457   LEUKOCYTESUR NEGATIVE 06/18/2021 0457   Sepsis Labs: @LABRCNTIP (procalcitonin:4,lacticidven:4) )No results found for this or any previous visit (from the past 240 hour(s)).   Radiological Exams on Admission: No results found.  EKG: Sinus tachycardia, rate 102, PVCs, PACs, RAD, RBBB.   Assessment/Plan  1. Periprosthetic left proximal femur fx and fluid collections; s/p Lt THA on 09/16/21  - S/p Lt THA on 09/16/21, went to [place] 12/3 with increasing Lt hip pain, had CT concerning for periprosthetic fracture and fluid collections of indeterminate sterility   - Tmax 100.1 F at OSH with procalcitonin 0.27 and WBC 13,500  - She is afebrile, hemodynamically stable, and with no drainage from her incision or complaints on arrival  - [facility] aware of pt's arrival; given her stability, will hold antibiotics for now to improve yield of any surgical culture and wait for ortho recommendations   2. COPD; ILD; OSA; chronic  hypoxic & hypercarbic respiratory failure   - Appears stable, not wheezing, denies increased cough or SOB  - Continue ICS/LABA/LAMA, 2 Lpm supplemental O2, BiPAP qHS, prn SABA    3. Atrial fibrillation  - In SR on admission  - Continue Tikosyn and diltiazem, hold Eliquis pending ortho consult in am (she took am of 12/4 but can't remember if she took it that evening)    4. HFpEF  - EF was 65-70% in Oct 2022  - Appears compensated, chronic leg swelling is down  - Hold torsemide and losartan initially in light of increased creatinine, monitor volume status    5. CKD IIIb  - SCr was 1.60 on Dec 4th, up from 1.26 on Nov 15th  - Hold torsemide and losartan initially, renally-dose medications, monitor   6. Anemia  - Appears  stable in mid-8 range since surgery    7. Rheumatoid arthritis  - Continue leflunomide   8. CAD  - S/p CABG in 2016  - No anginal complaints, continue Lipitor    DVT prophylaxis: Eliquis pta (she took am of 12/4, not sure if she took it pm or 12/4), hold for now pending ortho consult  Code Status: DNR   Level of Care: Level of care: Med-Surg Family Communication: daughter updated at bedside  Disposition Plan:  Patient is from: home  Anticipated d/c is to: TBD Anticipated d/c date is: TBD based on ortho eval  Patient currently: Pending orthopedic surgery consultation  Consults called: [facility]  Admission status: Inpatient     Vianne Bulls, MD [facility]  10/06/2021, 11:52 PM

## 2021-10-07 ENCOUNTER — Inpatient Hospital Stay (HOSPITAL_COMMUNITY): Payer: Medicare Other

## 2021-10-07 ENCOUNTER — Other Ambulatory Visit: Payer: Self-pay

## 2021-10-07 DIAGNOSIS — Z96649 Presence of unspecified artificial hip joint: Secondary | ICD-10-CM | POA: Diagnosis not present

## 2021-10-07 DIAGNOSIS — M978XXA Periprosthetic fracture around other internal prosthetic joint, initial encounter: Secondary | ICD-10-CM | POA: Diagnosis not present

## 2021-10-07 LAB — CBC
HCT: 26.7 % — ABNORMAL LOW (ref 36.0–46.0)
Hemoglobin: 7.9 g/dL — ABNORMAL LOW (ref 12.0–15.0)
MCH: 29.2 pg (ref 26.0–34.0)
MCHC: 29.6 g/dL — ABNORMAL LOW (ref 30.0–36.0)
MCV: 98.5 fL (ref 80.0–100.0)
Platelets: 289 10*3/uL (ref 150–400)
RBC: 2.71 MIL/uL — ABNORMAL LOW (ref 3.87–5.11)
RDW: 16.1 % — ABNORMAL HIGH (ref 11.5–15.5)
WBC: 15.1 10*3/uL — ABNORMAL HIGH (ref 4.0–10.5)
nRBC: 0 % (ref 0.0–0.2)

## 2021-10-07 LAB — BASIC METABOLIC PANEL
Anion gap: 8 (ref 5–15)
BUN: 35 mg/dL — ABNORMAL HIGH (ref 8–23)
CO2: 36 mmol/L — ABNORMAL HIGH (ref 22–32)
Calcium: 8.7 mg/dL — ABNORMAL LOW (ref 8.9–10.3)
Chloride: 97 mmol/L — ABNORMAL LOW (ref 98–111)
Creatinine, Ser: 1.56 mg/dL — ABNORMAL HIGH (ref 0.44–1.00)
GFR, Estimated: 35 mL/min — ABNORMAL LOW (ref >60)
Glucose, Bld: 168 mg/dL — ABNORMAL HIGH (ref 70–99)
Potassium: 3.6 mmol/L (ref 3.5–5.1)
Sodium: 141 mmol/L (ref 135–145)

## 2021-10-07 LAB — HEPATIC FUNCTION PANEL
ALT: 12 U/L (ref 0–44)
AST: 16 U/L (ref 15–41)
Albumin: 2.7 g/dL — ABNORMAL LOW (ref 3.5–5.0)
Alkaline Phosphatase: 74 U/L (ref 38–126)
Bilirubin, Direct: 0.1 mg/dL (ref 0.0–0.2)
Indirect Bilirubin: 0.4 mg/dL (ref 0.3–0.9)
Total Bilirubin: 0.5 mg/dL (ref 0.3–1.2)
Total Protein: 6.5 g/dL (ref 6.5–8.1)

## 2021-10-07 LAB — SEDIMENTATION RATE: Sed Rate: 131 mm/hr — ABNORMAL HIGH (ref 0–22)

## 2021-10-07 LAB — C-REACTIVE PROTEIN: CRP: 27.8 mg/dL — ABNORMAL HIGH (ref <1.0)

## 2021-10-07 LAB — PROCALCITONIN: Procalcitonin: 0.19 ng/mL

## 2021-10-07 LAB — MAGNESIUM: Magnesium: 2 mg/dL (ref 1.7–2.4)

## 2021-10-07 MED ORDER — CEFAZOLIN SODIUM-DEXTROSE 2-4 GM/100ML-% IV SOLN
2.0000 g | Freq: Three times a day (TID) | INTRAVENOUS | Status: DC
  Administered 2021-10-07 – 2021-10-08 (×3): 2 g via INTRAVENOUS
  Filled 2021-10-07 (×4): qty 100

## 2021-10-07 MED ORDER — SODIUM CHLORIDE 0.9 % IV SOLN: INTRAVENOUS | Status: DC

## 2021-10-07 NOTE — Progress Notes (Signed)
PROGRESS NOTE    [name]  [id number] DOB: [birth year] DOA: 10/06/2021 PCP: Isaac Bliss, Rayford Halsted, MD   Brief Narrative: Per dr Myna Hidalgo [name] is a very pleasant 76 y.o. female with medical history significant for CAD, HFpEF, atrial fibrillation on Eliquis, CKD IIIb, ILD, COPD, OSA, chronic hypoxic and hypercarbic respiratory failure, RA, and osteoarthritis status post left total hip arthroplasty on 09/16/2021 who presented to [facility] on 10/05/2021 with increasing left hip pain.  Patient reports that she was ambulating and pain was well controlled at time of her hospital discharge on 09/23/2021.  She developed insidiously worsening pain at home following the discharge and was becoming less and less mobile due to this.  Her daughter was helping her back at home after the surgery and reports seeing only a tiny amount of yellow drainage from the superior aspect of the incision when she change the bandages prior to presenting to [facility].  Patient denies any fevers or chills, and denies any recent increase in chronic cough or dyspnea.   [facility] Course: Upon arrival to the ED, patient is found to have a temperature of 100.1 F with slightly elevated heart rate.  She had a CT of the left hip concerning for periprosthetic intertrochanteric left femur fracture and periprosthetic fluid collections.  She had a CTA chest is negative for PE and notable for chronic appearing interstitial lung disease.  WBC was 13,500, hemoglobin 8.6, creatinine 1.60, procalcitonin 0.27, and influenza and COVID PCR were negative.  She had an elevated proBNP and mild elevation in troponin which decreased on repeat.  She was given IV vancomycin and Zosn and IV Lasix.     There was discussion between orthopedic surgery at [facility] and surgeon covering for Dr. Wynelle Link who performed the patient's THA, and patient was transferred to medical service at [facility] where orthopedic surgery will  see her in consultation.     Assessment & Plan:   Principal Problem:   Periprosthetic fracture of femur following total replacement of hip Active Problems:   COPD III spirometry if use FEV1/VC p saba    Chronic respiratory failure with hypoxia and hypercapnia (HCC)   Atrial fibrillation (HCC)   Chronic heart failure with preserved ejection fraction (HCC)   Stage 3b chronic kidney disease (HCC)   CAD in native artery   ILD (interstitial lung disease) (HCC)   Fluid collection at surgical site   Periprosthetic fracture around internal prosthetic left hip joint ([place])       #1 left hip pain patient is status post left total hip arthroplasty on 09/16/2021.  She now complains of increasing left hip pain CT scan done in an outside hospital concerning for periprosthetic fracture and fluid collections.   Patient seen by Ortho x-ray of her hip ordered and waiting for final recommendations. She received vancomycin and Zosyn at the outside facility.  #2 chronic O2 dependent COPD/obstructive sleep apnea/interstitial lung disease with chronic hypoxia and hypercapnia-  Chest x-ray Continue nebulizers supplemental oxygen BiPAP nightly   #3 history of chronic atrial fibrillation on diltiazem and Tikosyn. Eliquis on hold for possible surgery.  #4 diastolic heart failure with EF 65 to 70% October 2022 Patient was on torsemide and losartan which has not been restarted.  #5 AKI on CKD stage IIIb creatinine is trending up continue to hold torsemide and losartan especially since she has been n.p.o. we will give her slow IV hydration.  #6 rheumatoid arthritis on leflunomide  #7 anemia of  chronic disease monitor hemoglobin currently stable  #8 history of CAD and CABG continue Lipitor Eliquis  Estimated body mass index is 39.61 kg/m as calculated from the following:   Height as of this encounter: 5' (1.524 m).   Weight as of this encounter: 92 kg.  DVT prophylaxis: Eliquis on hold for possible  OR  code Status: DNR  family Communication: Discussed with daughter at bedside disposition Plan:  Status is: Inpatient  Remains inpatient appropriate because: left hip surgery    Consultants: Ortho  Procedures: None Antimicrobials: None  Subjective: Patient is resting in bed with daughter by the bedside she denies any pain when she is not moving but does have pain when she is moving  Objective: Vitals:   10/07/21 0305 10/07/21 0628 10/07/21 1027 10/07/21 1126  BP: (!) 119/54 131/60 (!) 108/98   Pulse: 79 80 87   Resp: (!) 22 20 16    Temp: 98 F (36.7 C) 98 F (36.7 C) 99.1 F (37.3 C)   TempSrc: Oral Oral Axillary   SpO2: 94% 98% 99% 96%  Weight:      Height:        Intake/Output Summary (Last 24 hours) at 10/07/2021 1438 Last data filed at 10/07/2021 1113 Gross per 24 hour  Intake 243 ml  Output --  Net 243 ml   Filed Weights   10/06/21 2230  Weight: 92 kg    Examination:  General exam: Appears calm and comfortable  Respiratory system: Clear to auscultation. Respiratory effort normal. Cardiovascular system: S1 & S2 heard, RRR. No JVD, murmurs, rubs, gallops or clicks. No pedal edema. Gastrointestinal system: Abdomen is nondistended, soft and nontender. No organomegaly or masses felt. Normal bowel sounds heard. Central nervous system: Alert and oriented. No focal neurological deficits. Extremities: Left leg shorter than the right 1+ left lower extremity edema erythema surrounding the incision Skin: No rashes, lesions or ulcers Psychiatry: Judgement and insight appear normal. Mood & affect appropriate.     Data Reviewed: I have personally reviewed following labs and imaging studies  CBC: Recent Labs  Lab 10/07/21 0010  WBC 15.1*  HGB 7.9*  HCT 26.7*  MCV 98.5  PLT [id number]   Basic Metabolic Panel: Recent Labs  Lab 10/07/21 0010  NA 141  K 3.6  CL 97*  CO2 36*  GLUCOSE 168*  BUN 35*  CREATININE 1.56*  CALCIUM 8.7*  MG 2.0   GFR: Estimated  Creatinine Clearance: 32 mL/min (A) (by C-G formula based on SCr of 1.56 mg/dL (H)). Liver Function Tests: Recent Labs  Lab 10/07/21 0010  AST 16  ALT 12  ALKPHOS 74  BILITOT 0.5  PROT 6.5  ALBUMIN 2.7*   No results for input(s): LIPASE, AMYLASE in the last 168 hours. No results for input(s): AMMONIA in the last 168 hours. Coagulation Profile: No results for input(s): INR, PROTIME in the last 168 hours. Cardiac Enzymes: No results for input(s): CKTOTAL, CKMB, CKMBINDEX, TROPONINI in the last 168 hours. BNP (last 3 results) Recent Labs    06/13/21 1303  PROBNP 588*   HbA1C: No results for input(s): HGBA1C in the last 72 hours. CBG: No results for input(s): GLUCAP in the last 168 hours. Lipid Profile: No results for input(s): CHOL, HDL, LDLCALC, TRIG, CHOLHDL, LDLDIRECT in the last 72 hours. Thyroid Function Tests: No results for input(s): TSH, T4TOTAL, FREET4, T3FREE, THYROIDAB in the last 72 hours. Anemia Panel: No results for input(s): VITAMINB12, FOLATE, FERRITIN, TIBC, IRON, RETICCTPCT in the last 72 hours. Sepsis  Labs: Recent Labs  Lab 10/07/21 0010  PROCALCITON 0.19    No results found for this or any previous visit (from the past 240 hour(s)).       Radiology Studies: DG HIP UNILAT WITH PELVIS MIN 4 VIEWS LEFT  Result Date: 10/07/2021 CLINICAL DATA:  Recent left hip arthroplasty, pain EXAM: DG HIP (WITH OR WITHOUT PELVIS) 4+V LEFT COMPARISON:  09/16/2021 FINDINGS: There is previous left hip arthroplasty. There is evidence of recent fracture in the medial cortex of proximal shaft of left femur. There is faint radiolucent line in the lesser trochanter. There is deformity in the greater trochanter. As far as seen, there is no dislocation in the prosthetic left hip. IMPRESSION: Status post left hip arthroplasty. There is new fracture in the medial aspect of proximal shaft of left femur and possibly in the lesser trochanter. There are calcific densities in the  region of greater trochanter which may be related to recent surgery or possibly recent fracture. Electronically Signed   By: Ernie Avena M.D.   On: 10/07/2021 09:21        Scheduled Meds:  arformoterol  15 mcg Nebulization Q12H   atorvastatin  40 mg Oral Daily   budesonide  0.25 mg Nebulization BID   diltiazem  240 mg Oral Daily   dofetilide  250 mcg Oral BID   DULoxetine  30 mg Oral Daily   leflunomide  20 mg Oral Daily   levothyroxine  125 mcg Oral Q0600   montelukast  10 mg Oral Daily   pantoprazole  40 mg Oral QAC breakfast   pregabalin  50 mg Oral QHS   sodium chloride flush  3 mL Intravenous Q12H   umeclidinium bromide  1 puff Inhalation Daily   Continuous Infusions:  sodium chloride       LOS: 1 day     Alwyn Ren, MD 10/07/2021, 2:38 PM

## 2021-10-07 NOTE — Consult Note (Addendum)
CONSULT NOTE  Patient: [name] MRN: [id number] DOB: December 31, 1945  Subjective:  Chief Complaint: Left hip pain, s/p Left THA on 09/16/2021  HPI: [name], 75 y.o. female, with a PMH significant for CAD, HFpEF, atrial fibrillation on Eliquis, CKD IIIb, ILD, COPD, OSA, chronic hypoxic and hypercarbic respiratory failure, RA, and osteoarthritis, who is s/p Left THA performed on 09/16/2021. She was admitted to the hospital yesterday after presenting to [facility] on 10/05/2021 with left hip pain, 100.1 temperature, and elevated HR. Labs taken at [facility] revealed WBC was 13,500, hemoglobin 8.6, creatinine 1.60, procalcitonin 0.27, and influenza and COVID PCR were negative.  She had an elevated proBNP and mild elevation in troponin which decreased on repeat.  She was given IV vancomycin and Zosn and IV Lasix.    Patient reports increasing pain since discharge following the surgery, as well as redness around the incision, with small amounts of occasional discharge.     Patient Active Problem List   Diagnosis Date Noted   Periprosthetic fracture around internal prosthetic left hip joint ([place]) 10/06/2021   CAD in native artery    ILD (interstitial lung disease) ([place])    Periprosthetic fracture of femur following total replacement of hip    Fluid collection at surgical site    OA (osteoarthritis) of hip 09/16/2021   Primary osteoarthritis of left hip 09/16/2021   Acute on chronic respiratory failure with hypoxia and hypercapnia ([place]) 06/18/2021   Leukocytosis 06/18/2021   DNR (do not resuscitate) 06/18/2021   Persistent atrial fibrillation ([place]) 02/05/2021   Chronic heart failure with preserved ejection fraction ([place]) 01/07/2021   Stage 3b chronic kidney disease ([place]) 01/07/2021   Atrial fibrillation ([place]) 01/03/2021   Sciatica 12/27/2020   Diarrhea 12/27/2020   Atrial flutter ([place]) 12/25/2020   Secondary hypercoagulable state ([place]) 12/25/2020   Chronic  respiratory failure with hypoxia and hypercapnia ([place]) 04/20/2019   Severe episode of recurrent major depressive disorder, without psychotic features ([place]) 11/09/2018   COPD (chronic obstructive pulmonary disease) ([place]) 11/02/2018   COPD with acute exacerbation ([place]) 11/01/2018   CAP (community acquired pneumonia) 10/28/2018   ICH (intracerebral hemorrhage) ([place]) 08/25/2018   Hyperlipidemia 07/12/2015   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   Morbid (severe) obesity due to excess calories ([place]) 04/22/2015   Postoperative anemia due to acute blood loss 06/08/2013   History of home oxygen therapy 06/07/2013   GERD (gastroesophageal reflux disease) 06/07/2013   Barrett's esophagus 06/07/2013   OA (osteoarthritis) of knee 06/06/2013   COPD III spirometry if use FEV1/VC p saba  07/18/2010   PULMONARY FIBROSIS ILD POST INFLAMMATORY CHRONIC 07/18/2010   Compression fracture of thoracic vertebra ([place]) 07/02/2010   Hypothyroidism 04/20/2007   Essential hypertension 04/20/2007   PSORIATIC ARTHRITIS 04/20/2007    Past Medical History:  Diagnosis Date   Anemia    Arthritis    OA RIGHT KNEE WITH PAIN   Barrett esophagus    BiPAP (biphasic positive airway pressure)    Wears at night   Bradycardia 06/01/2015   CAD in native artery    a. NSTEMI 05/2015 s/p emergent CABG.   Chronic diastolic (congestive) heart failure (HCC)    Chronic kidney disease, stage 3a (HCC)    Chronic respiratory failure (HCC)    Chronic respiratory failure with hypoxia and hypercapnia ([place]) 02/04/2010   Followed in Pulmonary clinic/ [facility] [place]       - 02 dependent  since 07/02/10 >>  83% RA December 05, 2010       -  ONO RA 08/05/12  :  Positive sat < 89 x 2:73m> repeat on 2lpm rec 08/12/2012  - 06/17/2013 reported desat with activity p Knee surgery > rec restart 2lpm with activity  - 06/27/2013   Walked 2lpm  x one lap @ 185 stopped due to sat 88% not sob , desat to 82% on RA just at th   COPD III spirometry if use FEV1/VC p  saba  07/18/2010   Quit smoking May 2006       - PFT's  04/12/10 FEV1  1.21 (69%) ratio 77 and no change p B2,  DLC0 56%   VC 70%         - PFTs  08/08/2013 FEV1 1.21 (60%) ratio 86 and no change p B2 DLCO 79%  VC 72%  On symbicort 160 2bid  - PFT's  02/08/2018  FEV1 0.70 (40 % ) ratio 56 if use FEV1/VC  p 38 % improvement from saba p symb 160 prior to study with DLCO  78 % corrects to 147  % for alv volume   - 02/08/2018   Cough variant asthma 02/26/2011   Followed in Pulmonary clinic/ [facility]/ [place]  - PFT's  06/04/15  FEV1 1.20 (67 % ) ratio 83  p 6 % improvement from saba with DLCO  80 % corrects to 132 % for alv volume      - Clinical dx based on response to symbicort       FENO 09/16/2016  =   96 on symbicort 160 2bid > added singulair  Allergy profile 09/16/2016 >  Eos 0.5 /  IgE  78 neg RAST  -  Referred to rehab 04/29/2017 > completed   Depression    Dysrhythmia    Afib   Essential hypertension 04/20/2007   Qualifier: Diagnosis of  By: Marcelyn Ditty, RN, Katy Fitch    GERD (gastroesophageal reflux disease)    History of ARDS 2006   History of home oxygen therapy    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 small intracranial hemorrhage in setting of HTN.   Mild carotid artery disease (HCC)    a. Duplex 1-39% bilaterally in 2016.   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   Followed as Primary Care Patient/ [facility]/ [place]  -s/p ARDS 2006 with bacteremic S  Pna       - CT chest 07/03/10 Nonspecific PF mostly upper lobes       - CT chest 12/03/10 acute gg changes and effusions c/w chf - PFT's  02/08/2018   FVC 0.64 (28 %)   with DLCO  78 % corrects to 147 % for alv volume      Rhematoid arthritis    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   SOB (shortness of breath) on exertion     Past Surgical History:  Procedure Laterality Date   ABDOMINOPLASTY     CARDIAC CATHETERIZATION N/A 06/01/2015   Procedure: Left Heart Cath and Coronary Angiography;  Surgeon: Runell Gess, MD;  Location[facility] INVASIVE CV LAB;  Service: Cardiovascular;  Laterality: N/A;  CARDIOVERSION N/A 02/12/2021   Procedure: CARDIOVERSION;  Surgeon: Freada Bergeron, MD;  Location: [facility] ENDOSCOPY;  Service: Cardiovascular;  Laterality: N/A;   CARPAL TUNNEL RELEASE     CHOLECYSTECTOMY     CORONARY ARTERY BYPASS GRAFT N/A 06/04/2015   Procedure: CORONARY ARTERY BYPASS GRAFT times three            with left internal mammary artery and right leg saphenous vein;  Surgeon: Gaye Pollack, MD;  Location: [facility] OR;  Service: Open Heart Surgery;  Laterality: N/A;   cosmetic breast surgery     EYE SURGERY     cataract   JOINT REPLACEMENT     KNEE ARTHROSCOPY Left    TEE WITHOUT CARDIOVERSION  06/04/2015   Procedure: TRANSESOPHAGEAL ECHOCARDIOGRAM (TEE);  Surgeon: Gaye Pollack, MD;  [facility];  Service: Open Heart Surgery;;   TEE WITHOUT CARDIOVERSION N/A 02/12/2021   Procedure: TRANSESOPHAGEAL ECHOCARDIOGRAM (TEE);  Surgeon: Freada Bergeron, MD;  Location: [facility] ENDOSCOPY;  Service: Cardiovascular;  Laterality: N/A;   TOTAL HIP ARTHROPLASTY Left 09/16/2021   Procedure: TOTAL HIP ARTHROPLASTY ANTERIOR APPROACH;  Surgeon: Gaynelle Arabian, MD;  Location: [facility] ORS;  Service: Orthopedics;  Laterality: Left;   TOTAL KNEE ARTHROPLASTY Left    TOTAL KNEE ARTHROPLASTY Right 06/06/2013   Procedure: RIGHT TOTAL KNEE ARTHROPLASTY;  Surgeon: Gearlean Alf, MD;  Location: [facility] ORS;  Service: Orthopedics;  Laterality: Right;    Prior to Admission medications   Medication Sig Start Date End Date Taking? Authorizing Provider  acetaminophen (TYLENOL)  650 MG CR tablet Take 650 mg by mouth 2 (two) times daily as needed for pain.   Yes [provider]  albuterol (VENTOLIN HFA) 108 (90 Base) MCG/ACT inhaler Inhale 2 puffs into the lungs every 4 (four) hours as needed (shortness of breath, if you can't catch your breath). 06/04/20  Yes Tanda Rockers, MD  apixaban (ELIQUIS) 5 MG TABS tablet Take 1 tablet (5 mg total) by mouth 2 (two) times daily. 06/17/21  Yes Fenton, Clint R, PA  atorvastatin (LIPITOR) 40 MG tablet Take 40 mg by mouth daily.   Yes [provider]  budesonide (PULMICORT) 0.25 MG/2ML nebulizer solution One twice daily Patient taking differently: Take 0.25 mg by nebulization 2 (two) times daily. 07/02/20  Yes Tanda Rockers, MD  CALCIUM PO Take 1 tablet by mouth daily.   Yes [provider]  Cholecalciferol (VITAMIN D3) 2000 UNITS TABS Take 2,000 Units by mouth daily.   Yes [provider]  cyclobenzaprine (FLEXERIL) 5 MG tablet Take 5 mg by mouth at bedtime.   Yes [provider]  diltiazem (CARDIZEM CD) 240 MG 24 hr capsule Take 1 capsule (240 mg total) by mouth daily. 09/11/21  Yes Chandrasekhar, Mahesh A, MD  dofetilide (TIKOSYN) 250 MCG capsule Take 1 capsule by mouth twice daily Patient taking differently: Take 250 mcg by mouth 2 (two) times daily. 08/20/21  Yes Shirley Friar, PA-C  DULoxetine (CYMBALTA) 30 MG capsule Take 30 mg by mouth daily.   Yes [provider]  ferrous sulfate 325 (65 FE) MG tablet Take 1 tablet (325 mg total) by mouth daily with breakfast. Patient taking differently: Take 325 mg by mouth at bedtime. 01/10/21 10/07/22 Yes Chandrasekhar, Mahesh A, MD  formoterol (PERFOROMIST) 20 MCG/2ML nebulizer solution Take 2 mLs (20 mcg total) by nebulization 2 (two) times daily. Use in nebulizer twice daily perfectly regularly 07/02/20  Yes Tanda Rockers, MD  golimumab [facility] ARIA) 50  MG/4ML SOLN injection Inject 50 mg into the vein every 8 (eight) weeks.     Yes [provider]  hydrocortisone 2.5 % cream Apply 1 application topically daily as needed for itching.   Yes [provider]  hydroxypropyl methylcellulose / hypromellose (ISOPTO TEARS / GONIOVISC) 2.5 % ophthalmic solution Place 1 drop into both eyes 3 (three) times daily as needed for dry eyes.   Yes [provider]  ketoconazole (NIZORAL) 2 % cream Apply 1 application topically daily as needed for irritation. 01/30/21  Yes [provider]  leflunomide (ARAVA) 20 MG tablet Take 1 tablet (20 mg total) by mouth daily. 03/15/19  Yes Isaac Bliss, Rayford Halsted, MD  levothyroxine (SYNTHROID) 125 MCG tablet TAKE 1 TABLET BY MOUTH EVERY DAY Patient taking differently: Take 125 mcg by mouth daily before breakfast. 06/20/20  Yes Isaac Bliss, Rayford Halsted, MD  losartan (COZAAR) 50 MG tablet Take 1 tablet (50 mg total) by mouth daily. 01/17/21  Yes Chandrasekhar, Mahesh A, MD  Magnesium Oxide 400 MG CAPS Take 1 capsule (400 mg total) by mouth daily. 06/06/21  Yes Chandrasekhar, Mahesh A, MD  montelukast (SINGULAIR) 10 MG tablet Take 10 mg by mouth daily. 03/22/21  Yes [provider]  nitroGLYCERIN (NITROSTAT) 0.4 MG SL tablet Place 1 tablet (0.4 mg total) under the tongue every 5 (five) minutes as needed for chest pain. 05/24/21  Yes Chandrasekhar, Mahesh A, MD  nystatin (MYCOSTATIN/NYSTOP) powder APPLY 1 APPLICATION        TOPICALLY 3 TIMES A DAY Patient taking differently: Apply 1 application topically daily as needed (psoriasis). 03/07/21  Yes Isaac Bliss, Rayford Halsted, MD  pantoprazole (PROTONIX) 40 MG tablet Take 1 tablet (40 mg total) by mouth daily before breakfast. 01/10/21  Yes Tanda Rockers, MD  pregabalin (LYRICA) 50 MG capsule Take 50 mg by mouth at bedtime. 03/13/21  Yes [provider]  torsemide (DEMADEX) 20 MG tablet Take 1 tablet (20 mg total) by mouth 2 (two) times daily. 06/21/21  Yes Mariel Aloe, MD  traMADol (ULTRAM) 50 MG tablet Take  50 mg by mouth 2 (two) times daily as needed for moderate pain. 06/26/21  Yes [provider]  triamcinolone cream (KENALOG) 0.1 % Apply 1 application topically 2 (two) times daily as needed (for psoriasis).    Yes [provider]  umeclidinium bromide (INCRUSE ELLIPTA) 62.5 MCG/INH AEPB Inhale 1 puff into the lungs daily. 03/14/21  Yes Tanda Rockers, MD  vitamin B-12 (CYANOCOBALAMIN) 1000 MCG tablet Take 1,000 mcg by mouth daily.   Yes [provider]  OXYGEN Inhale 2 L into the lungs continuous.    [provider]    Allergies  Allergen Reactions   Gabapentin Other (See Comments)    Dizziness, lighthead    Social History   Socioeconomic History   Marital status: Widowed    Spouse name: Not on file   Number of children: Not on file   Years of education: Not on file   Highest education level: Not on file  Occupational History   Occupation: retired    Fish farm manager: [employer]  Tobacco Use   Smoking status: Former    Packs/day: 1.50    Years: 58.50    Pack years: 87.75    Types: Cigarettes    Quit date: 03/03/2005    Years since quitting: 16.6   Smokeless tobacco: Never  Vaping Use   Vaping Use: Never used  Substance and Sexual Activity   Alcohol  use: No   Drug use: No   Sexual activity: Not Currently  Other Topics Concern   Not on file  Social History Narrative   Not on file   Social Determinants of Health   Financial Resource Strain: Medium Risk   Difficulty of Paying Living Expenses: Somewhat hard  Food Insecurity: Not on file  Transportation Needs: Not on file  Physical Activity: Not on file  Stress: Not on file  Social Connections: Not on file  Intimate Partner Violence: Not on file    Tobacco Use: Medium Risk   Smoking Tobacco Use: Former   Smokeless Tobacco Use: Never   Passive Exposure: Not on file   Social History   Substance and Sexual Activity  Alcohol Use No    Family History  Problem Relation Age of Onset    Breast cancer Mother    Coronary artery disease Father    Rheum arthritis Father     ROS: Constitutional: no fever, no chills, no night sweats, no significant weight loss Cardiovascular: no chest pain, no palpitations Respiratory: no cough, no shortness of breath, No COPD Gastrointestinal: no vomiting, no nausea Musculoskeletal: no swelling in Joints, Joint Pain Neurologic: no numbness, no tingling, no difficulty with balance    Objective:  Physical Exam:  Well nourished and well developed.  The patient is oxygen dependent with nasal cannula in place. General - Patient is Alert and Oriented Extremity - Neurologically intact Neurovascular intact Intact pulses distally Dorsiflexion/Plantar flexion intact Dressing - scant drainage Skin - Mild erythema surrounding incision, no active drainage. Warm, well perfused.  MSK: No obvious abnormalities/deformities in LLE.  Motor Function - intact, moving foot and toes well on exam.    Vital signs in last 24 hours: Temp:  [98 F (36.7 C)-99.3 F (37.4 C)] 98 F (36.7 C) (12/05 0628) Pulse Rate:  [79-86] 80 (12/05 0628) Resp:  [19-22] 20 (12/05 0628) BP: (119-131)/(54-72) 131/60 (12/05 0628) SpO2:  [94 %-98 %] 98 % (12/05 0628) Weight:  [92 kg] 92 kg (12/04 2230)    Assessment/Plan:  Left hip pain, s/p Left THA  - Repeat X-ray of Left Hip ordered - will formulate plan based on x-ray findings.  - Continue NPO  - Pain well managed currently as long as patient is immobile.   Fenton Foy, MBA, PA-C [phone] Orthopedic Surgery [facility] [place]   I have seen and examined Ms Hedding and spoken with her and her daughter. She has a periprosthetic proximal femur fracture. Intra-op she had a greater trochanter fracture that probably propagated beyond the visible fracture line. I generally would treat this with ORIF +/- stem revision but she has such severe medical comorbidities that we (the patient, her family  and I) would like to attempt to treat this non-operatively to potentially avoid the risks inherent with another major surgical procedure. I do feel that this can be treated non-operatively with TDWB and frequent radiologic monitoring and if it heals in the current position then she should have minimal sequelae. If anything changes then surgery may become necessary.In addition, there are fluid collections on the CT ost likely consistent with hematoma. She has some peri-incisional erythema. I would like to treat her with IV antibiotics for 48 hours to see if it resolves. If, so we can avoid an irrigation and debridement. If it does not improve then she may need a formal I & D    Gaynelle Arabian, MD

## 2021-10-08 DIAGNOSIS — Z96649 Presence of unspecified artificial hip joint: Secondary | ICD-10-CM | POA: Diagnosis not present

## 2021-10-08 DIAGNOSIS — M978XXA Periprosthetic fracture around other internal prosthetic joint, initial encounter: Secondary | ICD-10-CM | POA: Diagnosis not present

## 2021-10-08 LAB — CBC
HCT: 26.7 % — ABNORMAL LOW (ref 36.0–46.0)
Hemoglobin: 7.8 g/dL — ABNORMAL LOW (ref 12.0–15.0)
MCH: 29 pg (ref 26.0–34.0)
MCHC: 29.2 g/dL — ABNORMAL LOW (ref 30.0–36.0)
MCV: 99.3 fL (ref 80.0–100.0)
Platelets: 276 10*3/uL (ref 150–400)
RBC: 2.69 MIL/uL — ABNORMAL LOW (ref 3.87–5.11)
RDW: 16.2 % — ABNORMAL HIGH (ref 11.5–15.5)
WBC: 10.5 10*3/uL (ref 4.0–10.5)
nRBC: 0 % (ref 0.0–0.2)

## 2021-10-08 LAB — BASIC METABOLIC PANEL
Anion gap: 8 (ref 5–15)
BUN: 35 mg/dL — ABNORMAL HIGH (ref 8–23)
CO2: 36 mmol/L — ABNORMAL HIGH (ref 22–32)
Calcium: 8.8 mg/dL — ABNORMAL LOW (ref 8.9–10.3)
Chloride: 100 mmol/L (ref 98–111)
Creatinine, Ser: 1.5 mg/dL — ABNORMAL HIGH (ref 0.44–1.00)
GFR, Estimated: 36 mL/min — ABNORMAL LOW (ref >60)
Glucose, Bld: 116 mg/dL — ABNORMAL HIGH (ref 70–99)
Potassium: 4.1 mmol/L (ref 3.5–5.1)
Sodium: 144 mmol/L (ref 135–145)

## 2021-10-08 LAB — C-REACTIVE PROTEIN: CRP: 14.5 mg/dL — ABNORMAL HIGH (ref <1.0)

## 2021-10-08 LAB — SEDIMENTATION RATE: Sed Rate: 120 mm/hr — ABNORMAL HIGH (ref 0–22)

## 2021-10-08 LAB — PROCALCITONIN: Procalcitonin: <0.1 ng/mL

## 2021-10-08 MED ORDER — APIXABAN 5 MG PO TABS: 5.0000 mg | ORAL_TABLET | Freq: Two times a day (BID) | ORAL | Status: DC

## 2021-10-08 MED ORDER — SODIUM CHLORIDE 0.9 % IV SOLN
2.0000 g | Freq: Two times a day (BID) | INTRAVENOUS | Status: DC
  Administered 2021-10-08 – 2021-10-14 (×14): 2 g via INTRAVENOUS
  Filled 2021-10-08 (×15): qty 2

## 2021-10-08 MED ORDER — VITAMIN D3 25 MCG (1000 UNIT) PO TABS
2000.0000 [IU] | ORAL_TABLET | Freq: Every day | ORAL | Status: DC
Start: 2021-10-08 — End: 2021-10-16
  Administered 2021-10-08 – 2021-10-16 (×9): 2000 [IU] via ORAL
  Filled 2021-10-08 (×9): qty 2

## 2021-10-08 MED ORDER — TORSEMIDE 20 MG PO TABS
20.0000 mg | ORAL_TABLET | Freq: Two times a day (BID) | ORAL | Status: DC
  Administered 2021-10-08 – 2021-10-16 (×15): 20 mg via ORAL
  Filled 2021-10-08 (×16): qty 1

## 2021-10-08 MED ORDER — FUROSEMIDE 10 MG/ML IJ SOLN
40.0000 mg | Freq: Once | INTRAMUSCULAR | Status: AC
  Administered 2021-10-08: 40 mg via INTRAVENOUS
  Filled 2021-10-08: qty 4

## 2021-10-08 MED ORDER — ENOXAPARIN SODIUM 100 MG/ML IJ SOSY
90.0000 mg | PREFILLED_SYRINGE | Freq: Two times a day (BID) | INTRAMUSCULAR | Status: DC
  Administered 2021-10-08 (×2): 90 mg via SUBCUTANEOUS
  Filled 2021-10-08 (×4): qty 1

## 2021-10-08 NOTE — Progress Notes (Addendum)
PROGRESS NOTE    [name]  WTU:[id number] DOB: 27-Jul-1946 DOA: 10/06/2021 PCP: [name], [name], MD   Brief Narrative: [name] is a very pleasant 75 y.o. female with medical history significant for CAD, HFpEF, atrial fibrillation on Eliquis, CKD IIIb, ILD, COPD, OSA, chronic hypoxic and hypercarbic respiratory failure, RA, and osteoarthritis status post left total hip arthroplasty on 09/16/2021 who presented to [facility] on 10/05/2021 with increasing left hip pain.  CT of the left hip concerning for periprosthetic intertrochanteric left femur fracture and periprosthetic fluid collections.  CT chest negative for PE.  She was seen by Dr. Lequita Halt and is recommending nonsurgical management due to multiple comorbidities.    [facility] Course: Upon arrival to the ED, patient is found to have a temperature of 100.1 F with slightly elevated heart rate.  She had a CT of the left hip concerning for periprosthetic intertrochanteric left femur fracture and periprosthetic fluid collections.  She had a CTA chest is negative for PE and notable for chronic appearing interstitial lung disease.  WBC was 13,500, hemoglobin 8.6, creatinine 1.60, procalcitonin 0.27, and influenza and COVID PCR were negative.  She had an elevated proBNP and mild elevation in troponin which decreased on repeat.  She was given IV vancomycin and Zosn and IV Lasix.     Assessment & Plan:   Principal Problem:   Periprosthetic fracture of femur following total replacement of hip Active Problems:   COPD III spirometry if use FEV1/VC p saba    Chronic respiratory failure with hypoxia and hypercapnia (HCC)   Atrial fibrillation (HCC)   Chronic heart failure with preserved ejection fraction (HCC)   Stage 3b chronic kidney disease (HCC)   CAD in native artery   ILD (interstitial lung disease) (HCC)   Fluid collection at surgical site   Periprosthetic fracture around internal prosthetic  left hip joint (HCC)    #1 left hip pain secondary to periprosthetic hip fracture- patient is status post left total hip arthroplasty on 09/16/2021.  She now complains of increasing left hip pain  CT scan done in an outside hospital concerning for periprosthetic fracture and fluid collections.   Patient seen by Ortho and recommending nonoperative treatment with TTWB. Consult physical therapy. Ancef for 24  hours to see if she improves if not she might need a formal I&D.  #2 chronic O2 dependent COPD/obstructive sleep apnea/interstitial lung disease with chronic hypoxia and hypercapnia-  Chest x-ray 10/07/2021 cardiomegaly with interstitial edema and pleural effusion.  Will restart Demadex 20 mg twice a day. Continue nebulizers supplemental oxygen BiPAP nightly   #3 history of chronic atrial fibrillation on diltiazem and Tikosyn.  Will continue to hold Eliquis in case if she plans to have surgery tomorrow.  Put her on Lovenox while awaiting decision on surgery.  #4 diastolic heart failure with EF 65 to 70% October 2022-restart torsemide.   #5 AKI on CKD stage IIIb -monitor renal functions on torsemide.  She had received slow IV fluids while she was n.p.o. awaiting to have surgery.    #6 rheumatoid arthritis on leflunomide  #7 anemia of chronic disease monitor hemoglobin currently stable  #8 history of CAD and CABG continue Lipitor Eliquis  Estimated body mass index is 39.61 kg/m as calculated from the following:   Height as of this encounter: 5' (1.524 m).   Weight as of this encounter: 92 kg.  DVT prophylaxis: lovenox code Status: DNR  family Communication: Discussed with daughter at bedside  disposition Plan:  Status is: Inpatient  Remains inpatient appropriate because: left hip surgery    Consultants: Ortho  Procedures: None Antimicrobials: None  Subjective:  Patient sitting up by the side of the bed trying to eat breakfast but she is very short of breath and wheezing and  using accessory muscles however she does not want breathing treatments at this time she would like to have a breakfast first chest x-ray x-ray shows fluid overload Lasix 40 mg IV x1 given and Demadex restarted.  IV fluids stopped. Objective: Vitals:   10/08/21 0321 10/08/21 0511 10/08/21 0613 10/08/21 0919  BP:  137/72 (!) 144/74   Pulse:  (!) 103 (!) 102   Resp:  20 16   Temp:  98.5 F (36.9 C) 98.4 F (36.9 C)   TempSrc:  Oral Oral   SpO2: 90% 96% 95% 95%  Weight:      Height:        Intake/Output Summary (Last 24 hours) at 10/08/2021 1119 Last data filed at 10/08/2021 [id number] Gross per 24 hour  Intake 1599.36 ml  Output 700 ml  Net 899.36 ml    Filed Weights   10/06/21 2230  Weight: 92 kg    Examination:  General exam: Appears in distress due to wheezing and shortness of breath Respiratory system: Coarse wheezing bilaterally to auscultation. Respiratory effort normal. Cardiovascular system: S1 & S2 heard, RRR. No JVD, murmurs, rubs, gallops or clicks. No pedal edema. Gastrointestinal system: Abdomen is nondistended, soft and nontender. No organomegaly or masses felt. Normal bowel sounds heard. Central nervous system: Alert and oriented. No focal neurological deficits. Extremities: Left leg shorter than the right 1+ left lower extremity edema erythema surrounding the incision Skin: No rashes, lesions or ulcers Psychiatry: Judgement and insight appear normal. Mood & affect appropriate.     Data Reviewed: I have personally reviewed following labs and imaging studies  CBC: Recent Labs  Lab 10/07/21 0010 10/08/21 0413  WBC 15.1* 10.5  HGB 7.9* 7.8*  HCT 26.7* 26.7*  MCV 98.5 99.3  PLT 289 [id number]    Basic Metabolic Panel: Recent Labs  Lab 10/07/21 0010 10/08/21 0413  NA 141 144  K 3.6 4.1  CL 97* 100  CO2 36* 36*  GLUCOSE 168* 116*  BUN 35* 35*  CREATININE 1.56* 1.50*  CALCIUM 8.7* 8.8*  MG 2.0  --     GFR: Estimated Creatinine Clearance: 33.3 mL/min (A)  (by C-G formula based on SCr of 1.5 mg/dL (H)). Liver Function Tests: Recent Labs  Lab 10/07/21 0010  AST 16  ALT 12  ALKPHOS 74  BILITOT 0.5  PROT 6.5  ALBUMIN 2.7*    No results for input(s): LIPASE, AMYLASE in the last 168 hours. No results for input(s): AMMONIA in the last 168 hours. Coagulation Profile: No results for input(s): INR, PROTIME in the last 168 hours. Cardiac Enzymes: No results for input(s): CKTOTAL, CKMB, CKMBINDEX, TROPONINI in the last 168 hours. BNP (last 3 results) Recent Labs    06/13/21 1303  PROBNP 588*    HbA1C: No results for input(s): HGBA1C in the last 72 hours. CBG: No results for input(s): GLUCAP in the last 168 hours. Lipid Profile: No results for input(s): CHOL, HDL, LDLCALC, TRIG, CHOLHDL, LDLDIRECT in the last 72 hours. Thyroid Function Tests: No results for input(s): TSH, T4TOTAL, FREET4, T3FREE, THYROIDAB in the last 72 hours. Anemia Panel: No results for input(s): VITAMINB12, FOLATE, FERRITIN, TIBC, IRON, RETICCTPCT in the last 72 hours. Sepsis Labs: Recent Labs  Lab 10/07/21 0010 10/08/21 0413  PROCALCITON 0.19 <0.10     No results found for this or any previous visit (from the past 240 hour(s)).       Radiology Studies: DG Chest 1 View  Result Date: 10/07/2021 CLINICAL DATA:  Hypoxia EXAM: CHEST  1 VIEW COMPARISON:  06/19/2021, CT 12/27/2020 FINDINGS: Post sternotomy changes. Cardiomegaly with vascular congestion and diffuse interstitial opacity likely edema. Probable small right-sided effusion. IMPRESSION: Cardiomegaly with vascular congestion and interstitial edema with probable small right effusion Electronically Signed   By: Donavan Foil M.D.   On: 10/07/2021 16:32   DG HIP UNILAT WITH PELVIS MIN 4 VIEWS LEFT  Result Date: 10/07/2021 CLINICAL DATA:  Recent left hip arthroplasty, pain EXAM: DG HIP (WITH OR WITHOUT PELVIS) 4+V LEFT COMPARISON:  09/16/2021 FINDINGS: There is previous left hip arthroplasty. There is  evidence of recent fracture in the medial cortex of proximal shaft of left femur. There is faint radiolucent line in the lesser trochanter. There is deformity in the greater trochanter. As far as seen, there is no dislocation in the prosthetic left hip. IMPRESSION: Status post left hip arthroplasty. There is new fracture in the medial aspect of proximal shaft of left femur and possibly in the lesser trochanter. There are calcific densities in the region of greater trochanter which may be related to recent surgery or possibly recent fracture. Electronically Signed   By: Elmer Picker M.D.   On: 10/07/2021 09:21        Scheduled Meds:  arformoterol  15 mcg Nebulization Q12H   atorvastatin  40 mg Oral Daily   budesonide  0.25 mg Nebulization BID   diltiazem  240 mg Oral Daily   dofetilide  250 mcg Oral BID   DULoxetine  30 mg Oral Daily   leflunomide  20 mg Oral Daily   levothyroxine  125 mcg Oral Q0600   montelukast  10 mg Oral Daily   pantoprazole  40 mg Oral QAC breakfast   pregabalin  50 mg Oral QHS   sodium chloride flush  3 mL Intravenous Q12H   umeclidinium bromide  1 puff Inhalation Daily   Continuous Infusions:  sodium chloride      ceFAZolin (ANCEF) IV 2 g (10/08/21 0513)     LOS: 2 days   Georgette Shell, MD 10/08/2021, 11:19 AM

## 2021-10-08 NOTE — Progress Notes (Signed)
ANTICOAGULATION CONSULT NOTE - Follow Up Consult  Pharmacy Consult for enoxaparin Indication: atrial fibrillation  Allergies  Allergen Reactions   Gabapentin Other (See Comments)    Dizziness, lighthead    Patient Measurements: Height: 5' (152.4 cm) Weight: 92 kg (202 lb 13.2 oz) IBW/kg (Calculated) : 45.5  Vital Signs: Temp: 98.4 F (36.9 C) (12/06 0613) Temp Source: Oral (12/06 0613) BP: 144/74 (12/06 9892) Pulse Rate: 102 (12/06 0613)  Labs: Recent Labs    10/07/21 0010 10/08/21 0413  HGB 7.9* 7.8*  HCT 26.7* 26.7*  PLT 289 276  CREATININE 1.56* 1.50*    Estimated Creatinine Clearance: 33.3 mL/min (A) (by C-G formula based on SCr of 1.5 mg/dL (H)).   Medications:  On apixaban 5 mg po BID prior to admission; last dose 12/4 0900  Assessment: Pharmacy consulted to dose enoxaparin for this 75 yo female on apixaban prior to admission (for atrial fibrillation) which is on hold pending possible surgical procedure.    Goal of Therapy:  Prevent stroke and systemic embolism Monitor platelets by anticoagulation protocol: Yes   Plan:  Lovenox 90 mg (approx 1mg /kg) subq every 12 hours Monitor CBC, signs/symptoms of bleeding F/U restarting oral anticoagulant when appropriate  , PharmD, BCPS Clinical Pharmacist [place] Please utilize Amion for appropriate phone number to reach the unit pharmacist [facility]) 10/08/2021 2:08 PM

## 2021-10-08 NOTE — Progress Notes (Signed)
Subjective:    Patient reports pain as mild.   Patient seen in rounds by Dr. Lequita Halt. Feeling slightly better this morning.    Objective: Vital signs in last 24 hours: Temp:  [98.3 F (36.8 C)-99.1 F (37.3 C)] 98.4 F (36.9 C) (12/06 9371) Pulse Rate:  [87-103] 102 (12/06 0613) Resp:  [16-20] 16 (12/06 0613) BP: (108-144)/(65-98) 144/74 (12/06 0613) SpO2:  [90 %-99 %] 95 % (12/06 0613)  Intake/Output from previous day:  Intake/Output Summary (Last 24 hours) at 10/08/2021 0819 Last data filed at 10/08/2021 0515 Gross per 24 hour  Intake 1482.36 ml  Output 300 ml  Net 1182.36 ml    Intake/Output this shift: No intake/output data recorded.  Labs: Recent Labs    10/07/21 0010 10/08/21 0413  HGB 7.9* 7.8*   Recent Labs    10/07/21 0010 10/08/21 0413  WBC 15.1* 10.5  RBC 2.71* 2.69*  HCT 26.7* 26.7*  PLT 289 276   Recent Labs    10/07/21 0010 10/08/21 0413  NA 141 144  K 3.6 4.1  CL 97* 100  CO2 36* 36*  BUN 35* 35*  CREATININE 1.56* 1.50*  GLUCOSE 168* 116*  CALCIUM 8.7* 8.8*   No results for input(s): LABPT, INR in the last 72 hours.  Exam: General - Patient is Alert and Oriented Extremity - Neurologically intact Neurovascular intact Sensation intact distally Dorsiflexion/Plantar flexion intact Dressing/Incision - Mild erythema still surround in the incision with no active drainage.  Motor Function - intact, moving foot and toes well on exam.   Past Medical History:  Diagnosis Date   Anemia    Arthritis    OA RIGHT KNEE WITH PAIN   Barrett esophagus    BiPAP (biphasic positive airway pressure)    Wears at night   Bradycardia 06/01/2015   CAD in native artery    a. NSTEMI 05/2015 s/p emergent CABG.   Chronic diastolic (congestive) heart failure (HCC)    Chronic kidney disease, stage 3a (HCC)    Chronic respiratory failure (HCC)    Chronic respiratory failure with hypoxia and hypercapnia (HCC) 02/04/2010   Followed in Pulmonary clinic/  [facility]/ [facility]       - 02 dependent  since 07/02/10 >>  83% RA December 05, 2010       - ONO RA 08/05/12  :  Positive sat < 89 x 2:73m> repeat on 2lpm rec 08/12/2012  - 06/17/2013 reported desat with activity p Knee surgery > rec restart 2lpm with activity  - 06/27/2013   Walked 2lpm  x one lap @ 185 stopped due to sat 88% not sob , desat to 82% on RA just at th   COPD III spirometry if use FEV1/VC p saba  07/18/2010   Quit smoking May 2006       - PFT's  04/12/10 FEV1  1.21 (69%) ratio 77 and no change p B2,  DLC0 56%   VC 70%         - PFTs  08/08/2013 FEV1 1.21 (60%) ratio 86 and no change p B2 DLCO 79%  VC 72%  On symbicort 160 2bid  - PFT's  02/08/2018  FEV1 0.70 (40 % ) ratio 56 if use FEV1/VC  p 38 % improvement from saba p symb 160 prior to study with DLCO  78 % corrects to 147  % for alv volume   - 02/08/2018   Cough variant asthma 02/26/2011   Followed in Pulmonary clinic/ [facility]/ [facility]  -  PFT's  06/04/15  FEV1 1.20 (67 % ) ratio 83  p 6 % improvement from saba with DLCO  80 % corrects to 132 % for alv volume      - Clinical dx based on response to symbicort       FENO 09/16/2016  =   96 on symbicort 160 2bid > added singulair  Allergy profile 09/16/2016 >  Eos 0.5 /  IgE  78 neg RAST  -  Referred to rehab 04/29/2017 > completed   Depression    Dysrhythmia    Afib   Essential hypertension 04/20/2007   Qualifier: Diagnosis of  By: Paulina Fusi, RN, Daine Gravel    GERD (gastroesophageal reflux disease)    History of ARDS 2006   History of home oxygen therapy    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 small intracranial hemorrhage in setting of HTN.   Mild carotid artery disease ([place])    a. Duplex 1-39% bilaterally in 2016.   Morbid (severe) obesity due to excess calories ([place]) 04/22/2015   pfts with erv 14% 06/04/15  And 33% 02/08/2018    NSTEMI (non-ST elevated myocardial infarction) ([place]) 05/31/2015    Pneumococcal pneumonia ([place]) 2006   HOSPITALIZED AND DEVELOPED ARDS   Psoriasis    Psoriatic arthritis ([place])    PULMONARY FIBROSIS ILD POST INFLAMMATORY CHRONIC 07/18/2010   Followed as Primary Care Patient/ [facility]/ [facility]  -s/p ARDS 2006 with bacteremic S  Pna       - CT chest 07/03/10 Nonspecific PF mostly upper lobes       - CT chest 12/03/10 acute gg changes and effusions c/w chf - PFT's  02/08/2018  FVC 0.64 (28 %)   with DLCO  78 % corrects to 147 % for alv volume      Rhematoid arthritis    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   SOB (shortness of breath) on exertion     Assessment/Plan:    Principal Problem:   Periprosthetic fracture of femur following total replacement of hip Active Problems:   COPD III spirometry if use FEV1/VC p saba    Chronic respiratory failure with hypoxia and hypercapnia (HCC)   Atrial fibrillation (HCC)   Chronic heart failure with preserved ejection fraction (HCC)   Stage 3b chronic kidney disease (HCC)   CAD in native artery   ILD (interstitial lung disease) (HCC)   Fluid collection at surgical site   Periprosthetic fracture around internal prosthetic left hip joint ([place])  Estimated body mass index is 39.61 kg/m as calculated from the following:   Height as of this encounter: 5' (1.524 m).   Weight as of this encounter: 92 kg.  TDWB Can resume normal diet.   IV Ancef ordered for incision. Will continue abx for an additional 24 hours.  Based on how this appears tomorrow morning, may consider I&D at that time. Will plan for NPO at midnight tonight just in case this is deemed necessary.   Dr. Wynelle Link again spoke with the patient and her daughter that under normal circumstances, an periprosthetic fracture like this would prompt surgical fixation.  However, given her extensive medical history, conservative measures are in her best interest. Patient and daughter both acknowledge and agree with this.   Theresa Duty, PA-C Orthopedic Surgery [phone] 10/08/2021, 8:19 AM

## 2021-10-08 NOTE — Consult Note (Signed)
   [facility] Inpatient Consult   10/08/2021  [name] [birth year] [id number]  [facility] [[facility]]  Accountable Care Organization [ACO] Patient: Medicare CMS [name]  Coverage for [name] Liaison  Primary Care Provider:  [name], [name], MD, [facility],  [name] is an Embedded provider with an Embedded Chronic Care Management program and team.  Patient is active with Embedded CCM Pharmacist per encounters.   Patient screened for  readmission report list for  less than 30 days hospitalization with noted high risk score for unplanned readmission risk and to assess for potential [facility]  [[facility]] Care Management service needs for post hospital transition.  Review of patient's medical record reveals patient is a recent Total Hip Replacement.  Plan:  Continue to follow progress and disposition to assess for post hospital care management needs.    For questions contact:   Charlesetta Shanks, RN BSN CCM [facility]  [phone] business mobile phone Toll free office [phone]  Fax number: [phone] [name]

## 2021-10-08 NOTE — Progress Notes (Addendum)
Called into patient's room by NT, patient's dressing saturated and constantly leaking moderate to large amounts of serous-serosanguineous fluid.  Wound looks more erythematous and edematous.  Contacted Barrie Dunker and [employer]	[place].  Barrie Dunker was able to call me back.  States to keep dressing on wound and change as it is saturated throughout the night.  Daughter at bedside and is aware.  Will pass on report to night shift nurse.

## 2021-10-08 NOTE — Progress Notes (Signed)
Pharmacy Antibiotic Note  [name] is a 75 y.o. female admitted on 10/06/2021 with periprosthetic left proximal femur fx and fluid collections; s/p Lt THA on 09/16/21.  Ancef initially ordered for cellulitis. Today, Pharmacy has been consulted for cefepime dosing.  Surgery is considering I&D for tomorrow.  Plan: Discontinue Ancef Cefepime 2 g IV every 12 hours Monitor clinical progress, renal function F/U any C&S, abx deescalation / LOT   Height: 5' (152.4 cm) Weight: 92 kg (202 lb 13.2 oz) IBW/kg (Calculated) : 45.5  Temp (24hrs), Avg:98.4 F (36.9 C), Min:98.3 F (36.8 C), Max:98.5 F (36.9 C)  Recent Labs  Lab 10/07/21 0010 10/08/21 0413  WBC 15.1* 10.5  CREATININE 1.56* 1.50*    Estimated Creatinine Clearance: 33.3 mL/min (A) (by C-G formula based on SCr of 1.5 mg/dL (H)).    Allergies  Allergen Reactions   Gabapentin Other (See Comments)    Dizziness, lighthead    Antimicrobials this admission: 12/5 ancef >> 12/6 12/6 cefepime >>   Microbiology results: 11/10 SARS Coronavirus 2: negative  Thank you for allowing pharmacy to be a part of this patient's care.  Selinda Eon, PharmD, BCPS Clinical Pharmacist [place] Please utilize Amion for appropriate phone number to reach the unit pharmacist [facility]) 10/08/2021 2:01 PM

## 2021-10-08 NOTE — Progress Notes (Signed)
Spoke to RN O'Berry about moderate to large amounts of drainage from incision. Dr.[name] plans for possible I and D in the AM.  NPO after midnight. Discussed case with Dr.Swinteck ,on call physician, who agreed with plan for dry dressing changes as needed overnight. Updated Arther Abbott PA-C. Plan to see patient in AM  Darrick Grinder, [place]

## 2021-10-09 ENCOUNTER — Encounter (HOSPITAL_COMMUNITY)
Admission: AD | Disposition: A | Discharge status: Home Health | Payer: Self-pay | Source: Other Acute Inpatient Hospital | Attending: Family Medicine

## 2021-10-09 ENCOUNTER — Encounter (HOSPITAL_COMMUNITY): Payer: Self-pay | Admitting: Certified Registered Nurse Anesthetist

## 2021-10-09 DIAGNOSIS — Z96649 Presence of unspecified artificial hip joint: Secondary | ICD-10-CM | POA: Diagnosis not present

## 2021-10-09 DIAGNOSIS — M978XXA Periprosthetic fracture around other internal prosthetic joint, initial encounter: Secondary | ICD-10-CM | POA: Diagnosis not present

## 2021-10-09 LAB — CBC
HCT: 28.6 % — ABNORMAL LOW (ref 36.0–46.0)
Hemoglobin: 8.3 g/dL — ABNORMAL LOW (ref 12.0–15.0)
MCH: 28.7 pg (ref 26.0–34.0)
MCHC: 29 g/dL — ABNORMAL LOW (ref 30.0–36.0)
MCV: 99 fL (ref 80.0–100.0)
Platelets: 272 10*3/uL (ref 150–400)
RBC: 2.89 MIL/uL — ABNORMAL LOW (ref 3.87–5.11)
RDW: 15.7 % — ABNORMAL HIGH (ref 11.5–15.5)
WBC: 8.2 10*3/uL (ref 4.0–10.5)
nRBC: 0 % (ref 0.0–0.2)

## 2021-10-09 LAB — BASIC METABOLIC PANEL
Anion gap: 8 (ref 5–15)
BUN: 27 mg/dL — ABNORMAL HIGH (ref 8–23)
CO2: 40 mmol/L — ABNORMAL HIGH (ref 22–32)
Calcium: 9.1 mg/dL (ref 8.9–10.3)
Chloride: 96 mmol/L — ABNORMAL LOW (ref 98–111)
Creatinine, Ser: 1.09 mg/dL — ABNORMAL HIGH (ref 0.44–1.00)
GFR, Estimated: 53 mL/min — ABNORMAL LOW (ref >60)
Glucose, Bld: 125 mg/dL — ABNORMAL HIGH (ref 70–99)
Potassium: 3.5 mmol/L (ref 3.5–5.1)
Sodium: 144 mmol/L (ref 135–145)

## 2021-10-09 LAB — C-REACTIVE PROTEIN: CRP: 13.4 mg/dL — ABNORMAL HIGH (ref <1.0)

## 2021-10-09 LAB — SEDIMENTATION RATE: Sed Rate: 130 mm/hr — ABNORMAL HIGH (ref 0–22)

## 2021-10-09 SURGERY — IRRIGATION AND DEBRIDEMENT HIP
Anesthesia: General | Site: Hip | Laterality: Left

## 2021-10-09 MED ORDER — CHLORHEXIDINE GLUCONATE 0.12 % MT SOLN: 15.0000 mL | Freq: Once | OROMUCOSAL | Status: DC

## 2021-10-09 MED ORDER — LACTATED RINGERS IV SOLN: INTRAVENOUS | Status: DC

## 2021-10-09 MED ORDER — POTASSIUM CHLORIDE 20 MEQ PO PACK
40.0000 meq | PACK | Freq: Once | ORAL | Status: AC
  Administered 2021-10-09: 40 meq via ORAL
  Filled 2021-10-09: qty 2

## 2021-10-09 MED ORDER — POTASSIUM CHLORIDE CRYS ER 20 MEQ PO TBCR: 20.0000 meq | EXTENDED_RELEASE_TABLET | ORAL | Status: AC

## 2021-10-09 MED ORDER — CHLORHEXIDINE GLUCONATE 4 % EX LIQD: 60.0000 mL | Freq: Once | CUTANEOUS | Status: DC

## 2021-10-09 MED ORDER — MELATONIN 3 MG PO TABS
3.0000 mg | ORAL_TABLET | Freq: Once | ORAL | Status: AC
  Administered 2021-10-09: 3 mg via ORAL
  Filled 2021-10-09: qty 1

## 2021-10-09 MED ORDER — ACETAMINOPHEN 10 MG/ML IV SOLN
1000.0000 mg | Freq: Once | INTRAVENOUS | Status: DC
  Filled 2021-10-09: qty 100

## 2021-10-09 MED ORDER — DEXAMETHASONE SODIUM PHOSPHATE 10 MG/ML IJ SOLN: 8.0000 mg | Freq: Once | INTRAMUSCULAR | Status: DC

## 2021-10-09 MED ORDER — POVIDONE-IODINE 10 % EX SWAB: 2.0000 "application " | Freq: Once | CUTANEOUS | Status: DC

## 2021-10-09 MED ORDER — CEFAZOLIN SODIUM-DEXTROSE 2-4 GM/100ML-% IV SOLN
2.0000 g | INTRAVENOUS | Status: DC
  Filled 2021-10-09: qty 100

## 2021-10-09 MED ORDER — METOPROLOL TARTRATE 25 MG PO TABS
12.5000 mg | ORAL_TABLET | Freq: Two times a day (BID) | ORAL | Status: DC
  Administered 2021-10-09 – 2021-10-16 (×14): 12.5 mg via ORAL
  Filled 2021-10-09 (×14): qty 1

## 2021-10-09 SURGICAL SUPPLY — 40 items
BAG COUNTER SPONGE SURGICOUNT (BAG) IMPLANT
BAG SPEC THK2 15X12 ZIP CLS (MISCELLANEOUS) ×1
BAG SPNG CNTER NS LX DISP (BAG)
BAG ZIPLOCK 12X15 (MISCELLANEOUS) ×2 IMPLANT
COVER SURGICAL LIGHT HANDLE (MISCELLANEOUS) ×2 IMPLANT
DRAPE INCISE IOBAN 66X45 STRL (DRAPES) ×2 IMPLANT
DRAPE ORTHO SPLIT 77X108 STRL (DRAPES) ×4
DRAPE SURG ORHT 6 SPLT 77X108 (DRAPES) ×2 IMPLANT
DRAPE U-SHAPE 47X51 STRL (DRAPES) ×2 IMPLANT
DRESSING MEPILEX FLEX 4X4 (GAUZE/BANDAGES/DRESSINGS) ×1 IMPLANT
DRSG ADAPTIC 3X8 NADH LF (GAUZE/BANDAGES/DRESSINGS) ×2 IMPLANT
DRSG MEPILEX BORDER 4X8 (GAUZE/BANDAGES/DRESSINGS) ×2 IMPLANT
DRSG MEPILEX FLEX 4X4 (GAUZE/BANDAGES/DRESSINGS) ×2
DURAPREP 26ML APPLICATOR (WOUND CARE) ×2 IMPLANT
ELECT REM PT RETURN 15FT ADLT (MISCELLANEOUS) ×2 IMPLANT
EVACUATOR 1/8 PVC DRAIN (DRAIN) ×2 IMPLANT
GAUZE SPONGE 4X4 12PLY STRL (GAUZE/BANDAGES/DRESSINGS) ×2 IMPLANT
GLOVE SRG 8 PF TXTR STRL LF DI (GLOVE) ×1 IMPLANT
GLOVE SURG ENC MOIS LTX SZ6.5 (GLOVE) ×4 IMPLANT
GLOVE SURG ENC MOIS LTX SZ8 (GLOVE) ×4 IMPLANT
GLOVE SURG UNDER POLY LF SZ7 (GLOVE) ×6 IMPLANT
GLOVE SURG UNDER POLY LF SZ8 (GLOVE) ×2
GOWN STRL REUS W/TWL LRG LVL3 (GOWN DISPOSABLE) ×4 IMPLANT
HANDPIECE INTERPULSE COAX TIP (DISPOSABLE) ×2
KIT BASIN OR (CUSTOM PROCEDURE TRAY) ×2 IMPLANT
KIT TURNOVER KIT A (KITS) IMPLANT
MANIFOLD NEPTUNE II (INSTRUMENTS) ×2 IMPLANT
PACK TOTAL JOINT (CUSTOM PROCEDURE TRAY) ×2 IMPLANT
PROTECTOR NERVE ULNAR (MISCELLANEOUS) ×2 IMPLANT
SET HNDPC FAN SPRY TIP SCT (DISPOSABLE) ×1 IMPLANT
STAPLER VISISTAT 35W (STAPLE) ×2 IMPLANT
SUT MNCRL AB 4-0 PS2 18 (SUTURE) ×2 IMPLANT
SUT STRATAFIX PDS+ 0 24IN (SUTURE) ×2 IMPLANT
SUT VIC AB 1 CT1 27 (SUTURE) ×6
SUT VIC AB 1 CT1 27XBRD ANTBC (SUTURE) ×3 IMPLANT
SUT VIC AB 2-0 CT1 27 (SUTURE) ×6
SUT VIC AB 2-0 CT1 TAPERPNT 27 (SUTURE) ×3 IMPLANT
SWAB COLLECTION DEVICE MRSA (MISCELLANEOUS) ×2 IMPLANT
SWAB CULTURE ESWAB REG 1ML (MISCELLANEOUS) ×2 IMPLANT
TOWEL OR 17X26 10 PK STRL BLUE (TOWEL DISPOSABLE) ×4 IMPLANT

## 2021-10-09 NOTE — Progress Notes (Addendum)
PROGRESS NOTE    [name]  [id number] DOB: 12/04/1945 DOA: 10/06/2021 PCP: Isaac Bliss, Rayford Halsted, MD    Brief Narrative:  This 76 y.o. female with PMH significant for CAD, HFpEF, atrial fibrillation on Eliquis, CKD IIIb, ILD, COPD, OSA, chronic hypoxic and hypercarbic respiratory failure, RA, and osteoarthritis status post left total hip arthroplasty on 09/16/2021 who presented to [facility] on 10/05/2021 with increasing left hip pain.  CT of the left hip concerning for periprosthetic intertrochanteric left femur fracture and periprosthetic fluid collections.  CT chest negative for PE.  She was seen by Dr. Wynelle Link and is recommending nonsurgical management due to multiple comorbidities.    Patient is admitted for Periprosthetic hip fracture with fluid collection.  Orthopedics has decided possible debridement 12/7.  Assessment & Plan:   Principal Problem:   Periprosthetic fracture of femur following total replacement of hip Active Problems:   COPD III spirometry if use FEV1/VC p saba    Chronic respiratory failure with hypoxia and hypercapnia (HCC)   Atrial fibrillation (HCC)   Chronic heart failure with preserved ejection fraction (HCC)   Stage 3b chronic kidney disease (HCC)   CAD in native artery   ILD (interstitial lung disease) (HCC)   Fluid collection at surgical site   Periprosthetic fracture around internal prosthetic left hip joint ([place])   Left hip pain secondary to periprosthetic hip fracture: Patient s/p left total hip arthroplasty on 09/16/2021 She now complains of increasing left hip pain and drainage. CT scan done in an outside hospital concerning for periprosthetic fracture and fluid collections.   Patient seen by Ortho and recommended nonoperative treatment with TTWB initially. Continue IV Ancef for now.  Given increased drainage and fluid pocket seen on CT scan Ortho recommended it is best to proceed with an irrigation and debridement of  the wound on 12/7. Follow-up postoperative recommendation.  Chronic hypoxic and hypercapnic respiratory failure sec. to COPD, OSA, ILD: Chest x-ray 10/07/2021 cardiomegaly with interstitial edema and pleural effusion.   Continue Demadex 20 mg twice a day. Continue nebulizer,  supplemental oxygen and BiPAP nightly.   Chronic atrial fibrillation: Heart rate controlled.  Continue Cardizem and Tikosyn. Will hold Eliquis as she is going to have debridement. Continue Lovenox in the meanwhile.  Chronic diastolic heart failure: Last LVEF 65 to 70% in 10/22 Continue torsemide  AKI on CKD stage IIIb: Monitors renal functions on torsemide Continue gentle IV hydration  Rheumatoid arthritis: Continue leflunomide  Anemia of chronic disease: Hemoglobin currently stable, continue to monitor H&H  History of CAD and CABG: Continue Lipitor      DVT prophylaxis: Lovenox Code Status: DNR Family Communication: No family at bedside Disposition Plan:   Status is: Inpatient  Remains inpatient appropriate because: Possible fluid collection requiring debridement and incision and drainage.  Consultants:  Orthopedics  Procedures: Debridement and incision and drainage Antimicrobials:  Anti-infectives (From admission, onward)    Start     Dose/Rate Route Frequency Ordered Stop   10/09/21 1300  ceFAZolin (ANCEF) IVPB 2g/100 mL premix  Status:  Discontinued        2 g 200 mL/hr over 30 Minutes Intravenous On call to O.R. 10/09/21 1239 10/09/21 1322   10/08/21 1330  ceFEPIme (MAXIPIME) 2 g in sodium chloride 0.9 % 100 mL IVPB        2 g 200 mL/hr over 30 Minutes Intravenous Every 12 hours 10/08/21 1237     10/07/21 1700  ceFAZolin (ANCEF) IVPB 2g/100 mL premix  Status:  Discontinued        2 g 200 mL/hr over 30 Minutes Intravenous Every 8 hours 10/07/21 1614 10/08/21 1237        Subjective: Patient was seen and examined at bedside.  Overnight events noted.  Patient reports feeling better,  states she is going to have debridement and incision and drainage today.  She reports pain is manageable with pain medication.  Objective: Vitals:   10/08/21 2102 10/09/21 0424 10/09/21 0425 10/09/21 0922  BP: (!) 138/93 132/79    Pulse: 90 95    Resp: 18 20    Temp: 98.6 F (37 C) 98.4 F (36.9 C)    TempSrc: Axillary Oral    SpO2: 94% 97%  94%  Weight:   92.2 kg   Height:        Intake/Output Summary (Last 24 hours) at 10/09/2021 1336 Last data filed at 10/08/2021 2307 Gross per 24 hour  Intake 436.06 ml  Output 1850 ml  Net -1413.94 ml   Filed Weights   10/06/21 2230 10/09/21 0425  Weight: 92 kg 92.2 kg    Examination:  General exam: Appears comfortable, morbidly obese, lying flat on the back.  Deconditioned Respiratory system: Clear to auscultation. Respiratory effort normal.  RR 15 Cardiovascular system: S1-S2 heard, regular rate and rhythm, no murmur.   Gastrointestinal system: Abdomen is soft, nontender, nondistended, BS+ Central nervous system: Alert and oriented x3. No focal neurological deficits. Extremities: Left hip tenderness noted, dressing noted soaked in discharge Skin: No rashes, lesions or ulcers Psychiatry: Judgement and insight appear normal. Mood & affect appropriate.     Data Reviewed: I have personally reviewed following labs and imaging studies  CBC: Recent Labs  Lab 10/07/21 0010 10/08/21 0413 10/09/21 0406  WBC 15.1* 10.5 8.2  HGB 7.9* 7.8* 8.3*  HCT 26.7* 26.7* 28.6*  MCV 98.5 99.3 99.0  PLT 289 276 [id number]   Basic Metabolic Panel: Recent Labs  Lab 10/07/21 0010 10/08/21 0413 10/09/21 0406  NA 141 144 144  K 3.6 4.1 3.5  CL 97* 100 96*  CO2 36* 36* 40*  GLUCOSE 168* 116* 125*  BUN 35* 35* 27*  CREATININE 1.56* 1.50* 1.09*  CALCIUM 8.7* 8.8* 9.1  MG 2.0  --   --    GFR: Estimated Creatinine Clearance: 45.9 mL/min (A) (by C-G formula based on SCr of 1.09 mg/dL (H)). Liver Function Tests: Recent Labs  Lab 10/07/21 0010   AST 16  ALT 12  ALKPHOS 74  BILITOT 0.5  PROT 6.5  ALBUMIN 2.7*   No results for input(s): LIPASE, AMYLASE in the last 168 hours. No results for input(s): AMMONIA in the last 168 hours. Coagulation Profile: No results for input(s): INR, PROTIME in the last 168 hours. Cardiac Enzymes: No results for input(s): CKTOTAL, CKMB, CKMBINDEX, TROPONINI in the last 168 hours. BNP (last 3 results) Recent Labs    06/13/21 1303  PROBNP 588*   HbA1C: No results for input(s): HGBA1C in the last 72 hours. CBG: No results for input(s): GLUCAP in the last 168 hours. Lipid Profile: No results for input(s): CHOL, HDL, LDLCALC, TRIG, CHOLHDL, LDLDIRECT in the last 72 hours. Thyroid Function Tests: No results for input(s): TSH, T4TOTAL, FREET4, T3FREE, THYROIDAB in the last 72 hours. Anemia Panel: No results for input(s): VITAMINB12, FOLATE, FERRITIN, TIBC, IRON, RETICCTPCT in the last 72 hours. Sepsis Labs: Recent Labs  Lab 10/07/21 0010 10/08/21 0413  PROCALCITON 0.19 <0.10    No results found for this  or any previous visit (from the past 240 hour(s)).   Radiology Studies: DG Chest 1 View  Result Date: 10/07/2021 CLINICAL DATA:  Hypoxia EXAM: CHEST  1 VIEW COMPARISON:  06/19/2021, CT 12/27/2020 FINDINGS: Post sternotomy changes. Cardiomegaly with vascular congestion and diffuse interstitial opacity likely edema. Probable small right-sided effusion. IMPRESSION: Cardiomegaly with vascular congestion and interstitial edema with probable small right effusion Electronically Signed   By: Jasmine Pang M.D.   On: 10/07/2021 16:32    Scheduled Meds:  arformoterol  15 mcg Nebulization Q12H   atorvastatin  40 mg Oral Daily   budesonide  0.25 mg Nebulization BID   cholecalciferol  2,000 Units Oral Daily   diltiazem  240 mg Oral Daily   dofetilide  250 mcg Oral BID   DULoxetine  30 mg Oral Daily   leflunomide  20 mg Oral Daily   levothyroxine  125 mcg Oral Q0600   montelukast  10 mg Oral  Daily   pantoprazole  40 mg Oral QAC breakfast   potassium chloride  20 mEq Oral Q2H   pregabalin  50 mg Oral QHS   sodium chloride flush  3 mL Intravenous Q12H   torsemide  20 mg Oral BID   umeclidinium bromide  1 puff Inhalation Daily   Continuous Infusions:  sodium chloride     ceFEPime (MAXIPIME) IV 2 g (10/09/21 1018)     LOS: 3 days    Time spent: 35 mins    Whittley Carandang, MD [facility]   If 7PM-7AM, please contact night-coverage

## 2021-10-09 NOTE — TOC Progression Note (Signed)
Transition of Care [facility]) - Progression Note    Patient Details  Name: [name] MRN: [id number] Date of Birth: July 02, 1946  Transition of [facility]) CM/SW Contact  Geni Bers, RN Phone Number: 10/09/2021, 2:55 PM  Clinical Narrative:    TOC reviewed chart. Will continue to follow for discharge needs.    Expected Discharge Plan: Home w Home Health Services Barriers to Discharge: No Barriers Identified  Expected Discharge Plan and Services Expected Discharge Plan: Home w Home Health Services       Living arrangements for the past 2 months: Single Family Home                                       Social Determinants of Health (SDOH) Interventions    Readmission Risk Interventions Readmission Risk Prevention Plan 06/21/2021  Transportation Screening Complete  PCP or Specialist Appt within 5-7 Days Complete  Home Care Screening Complete  Medication Review (RN CM) Complete  Some recent data might be hidden

## 2021-10-09 NOTE — Progress Notes (Signed)
Pt right hip dressing changed 3x during night shift due to moderate saturation. Dressing consisted of four 4x4 folded in half with ABD pad.

## 2021-10-09 NOTE — Progress Notes (Signed)
Subjective: Pt reports increased drainage from incision yesterday afternoon/evening after rolling on her side. Her nurse states she changed the dressing 2-3 x last night with decreased drainage with the last dressing change.  Objective: Vital signs in last 24 hours: Temp:  [98.4 F (36.9 C)-99.2 F (37.3 C)] 98.4 F (36.9 C) (12/07 0424) Pulse Rate:  [90-95] 95 (12/07 0424) Resp:  [18-20] 20 (12/07 0424) BP: (132-148)/(63-93) 132/79 (12/07 0424) SpO2:  [93 %-99 %] 97 % (12/07 0424) Weight:  [92.2 kg] 92.2 kg (12/07 0425)  Intake/Output from previous day: 12/06 0701 - 12/07 0700 In: 796.1 [P.O.:600; IV Piggyback:196.1] Out: 2750 [Urine:2750] Intake/Output this shift: No intake/output data recorded.  Recent Labs    10/07/21 0010 10/08/21 0413 10/09/21 0406  HGB 7.9* 7.8* 8.3*   Recent Labs    10/08/21 0413 10/09/21 0406  WBC 10.5 8.2  RBC 2.69* 2.89*  HCT 26.7* 28.6*  PLT 276 272   Recent Labs    10/08/21 0413 10/09/21 0406  NA 144 144  K 4.1 3.5  CL 100 96*  CO2 36* 40*  BUN 35* 27*  CREATININE 1.50* 1.09*  GLUCOSE 116* 125*  CALCIUM 8.8* 9.1   No results for input(s): LABPT, INR in the last 72 hours.  Swelling appears decreased around the peri-incisional area Still slight erythema but improved compared to 2 days ago No drainage currently on bandage WBC decreased to 8.2   Assessment/Plan: Left hip periprosthetic fracture and post-op seroma- Given the increased drainage yesterday and the fluid pockets seen on CT, I feel it is best to proceed with an irrigation and debridement of the wound today. The increased drainage could be from expression of the hematoma but given the overall clinical situation and the risk of infection, we will proceed with the surgery today. I discussed this in detail with the patient and her daughter and they would like to proceed   Ollen Gross 10/09/2021, 7:45 AM

## 2021-10-10 ENCOUNTER — Inpatient Hospital Stay (HOSPITAL_COMMUNITY): Payer: Medicare Other | Admitting: Anesthesiology

## 2021-10-10 ENCOUNTER — Encounter (HOSPITAL_COMMUNITY): Payer: Self-pay | Admitting: Family Medicine

## 2021-10-10 ENCOUNTER — Encounter (HOSPITAL_COMMUNITY)
Admission: AD | Disposition: A | Discharge status: Home Health | Payer: Self-pay | Source: Other Acute Inpatient Hospital | Attending: Family Medicine

## 2021-10-10 DIAGNOSIS — M978XXA Periprosthetic fracture around other internal prosthetic joint, initial encounter: Secondary | ICD-10-CM | POA: Diagnosis not present

## 2021-10-10 DIAGNOSIS — S7002XA Contusion of left hip, initial encounter: Secondary | ICD-10-CM | POA: Diagnosis present

## 2021-10-10 DIAGNOSIS — Z96649 Presence of unspecified artificial hip joint: Secondary | ICD-10-CM | POA: Diagnosis not present

## 2021-10-10 HISTORY — PX: INCISION AND DRAINAGE HIP: SHX1801

## 2021-10-10 LAB — PHOSPHORUS: Phosphorus: 2.5 mg/dL (ref 2.5–4.6)

## 2021-10-10 LAB — CBC
HCT: 29.9 % — ABNORMAL LOW (ref 36.0–46.0)
Hemoglobin: 8.8 g/dL — ABNORMAL LOW (ref 12.0–15.0)
MCH: 29.2 pg (ref 26.0–34.0)
MCHC: 29.4 g/dL — ABNORMAL LOW (ref 30.0–36.0)
MCV: 99.3 fL (ref 80.0–100.0)
Platelets: 292 10*3/uL (ref 150–400)
RBC: 3.01 MIL/uL — ABNORMAL LOW (ref 3.87–5.11)
RDW: 15.7 % — ABNORMAL HIGH (ref 11.5–15.5)
WBC: 9.3 10*3/uL (ref 4.0–10.5)
nRBC: 0 % (ref 0.0–0.2)

## 2021-10-10 LAB — MAGNESIUM: Magnesium: 1.7 mg/dL (ref 1.7–2.4)

## 2021-10-10 LAB — BASIC METABOLIC PANEL
Anion gap: 9 (ref 5–15)
BUN: 28 mg/dL — ABNORMAL HIGH (ref 8–23)
CO2: 41 mmol/L — ABNORMAL HIGH (ref 22–32)
Calcium: 9.2 mg/dL (ref 8.9–10.3)
Chloride: 93 mmol/L — ABNORMAL LOW (ref 98–111)
Creatinine, Ser: 1.09 mg/dL — ABNORMAL HIGH (ref 0.44–1.00)
GFR, Estimated: 53 mL/min — ABNORMAL LOW (ref >60)
Glucose, Bld: 113 mg/dL — ABNORMAL HIGH (ref 70–99)
Potassium: 3.3 mmol/L — ABNORMAL LOW (ref 3.5–5.1)
Sodium: 143 mmol/L (ref 135–145)

## 2021-10-10 SURGERY — IRRIGATION AND DEBRIDEMENT HIP
Anesthesia: Spinal | Site: Hip | Laterality: Left

## 2021-10-10 MED ORDER — FENTANYL CITRATE (PF) 100 MCG/2ML IJ SOLN
INTRAMUSCULAR | Status: AC
  Filled 2021-10-10: qty 2

## 2021-10-10 MED ORDER — METOPROLOL TARTRATE 5 MG/5ML IV SOLN
2.5000 mg | Freq: Once | INTRAVENOUS | Status: AC
  Administered 2021-10-10: 2.5 mg via INTRAVENOUS
  Filled 2021-10-10: qty 5

## 2021-10-10 MED ORDER — MORPHINE SULFATE (PF) 2 MG/ML IV SOLN
1.0000 mg | Freq: Once | INTRAVENOUS | Status: AC
  Administered 2021-10-10: 1 mg via INTRAVENOUS
  Filled 2021-10-10: qty 1

## 2021-10-10 MED ORDER — BUPIVACAINE IN DEXTROSE 0.75-8.25 % IT SOLN
INTRATHECAL | Status: DC | PRN
  Administered 2021-10-10: 1.4 mL via INTRATHECAL

## 2021-10-10 MED ORDER — POTASSIUM CHLORIDE 10 MEQ/100ML IV SOLN
10.0000 meq | INTRAVENOUS | Status: AC
  Administered 2021-10-10: 10 meq via INTRAVENOUS
  Filled 2021-10-10 (×2): qty 100

## 2021-10-10 MED ORDER — POTASSIUM CHLORIDE CRYS ER 20 MEQ PO TBCR: 20.0000 meq | EXTENDED_RELEASE_TABLET | ORAL | Status: AC

## 2021-10-10 MED ORDER — APIXABAN 5 MG PO TABS
5.0000 mg | ORAL_TABLET | Freq: Two times a day (BID) | ORAL | Status: DC
  Administered 2021-10-11 – 2021-10-16 (×11): 5 mg via ORAL
  Filled 2021-10-10 (×12): qty 1

## 2021-10-10 MED ORDER — PROPOFOL 10 MG/ML IV BOLUS
INTRAVENOUS | Status: AC
  Filled 2021-10-10: qty 20

## 2021-10-10 MED ORDER — ACETAMINOPHEN 10 MG/ML IV SOLN: 1000.0000 mg | Freq: Once | INTRAVENOUS | Status: DC | PRN

## 2021-10-10 MED ORDER — FENTANYL CITRATE (PF) 100 MCG/2ML IJ SOLN
INTRAMUSCULAR | Status: DC | PRN
  Administered 2021-10-10 (×2): 25 ug via INTRAVENOUS

## 2021-10-10 MED ORDER — CHLORHEXIDINE GLUCONATE 0.12 % MT SOLN
15.0000 mL | Freq: Once | OROMUCOSAL | Status: AC
  Administered 2021-10-10: 15 mL via OROMUCOSAL

## 2021-10-10 MED ORDER — PROPOFOL 10 MG/ML IV BOLUS
INTRAVENOUS | Status: DC | PRN
  Administered 2021-10-10 (×5): 10 mg via INTRAVENOUS

## 2021-10-10 MED ORDER — FENTANYL CITRATE PF 50 MCG/ML IJ SOSY: 25.0000 ug | PREFILLED_SYRINGE | INTRAMUSCULAR | Status: DC | PRN

## 2021-10-10 MED ORDER — DEXAMETHASONE SODIUM PHOSPHATE 10 MG/ML IJ SOLN
INTRAMUSCULAR | Status: AC
  Filled 2021-10-10: qty 1

## 2021-10-10 MED ORDER — POTASSIUM CHLORIDE 10 MEQ/100ML IV SOLN
INTRAVENOUS | Status: DC | PRN
  Administered 2021-10-10: 10 meq via INTRAVENOUS

## 2021-10-10 MED ORDER — OXYCODONE HCL 5 MG PO TABS: 5.0000 mg | ORAL_TABLET | Freq: Once | ORAL | Status: DC | PRN

## 2021-10-10 MED ORDER — PROPOFOL 1000 MG/100ML IV EMUL
INTRAVENOUS | Status: AC
  Filled 2021-10-10: qty 100

## 2021-10-10 MED ORDER — OXYCODONE HCL 5 MG/5ML PO SOLN: 5.0000 mg | Freq: Once | ORAL | Status: DC | PRN

## 2021-10-10 MED ORDER — DEXAMETHASONE SODIUM PHOSPHATE 10 MG/ML IJ SOLN
INTRAMUSCULAR | Status: DC | PRN
  Administered 2021-10-10: 4 mg via INTRAVENOUS

## 2021-10-10 MED ORDER — PHENYLEPHRINE HCL-NACL 20-0.9 MG/250ML-% IV SOLN
INTRAVENOUS | Status: DC | PRN
  Administered 2021-10-10: 40 ug/min via INTRAVENOUS

## 2021-10-10 MED ORDER — ONDANSETRON HCL 4 MG/2ML IJ SOLN: 4.0000 mg | Freq: Once | INTRAMUSCULAR | Status: DC | PRN

## 2021-10-10 MED ORDER — SODIUM CHLORIDE 0.9 % IR SOLN
Status: DC | PRN
  Administered 2021-10-10: 3000 mL
  Administered 2021-10-10: 1000 mL

## 2021-10-10 MED ORDER — NITROGLYCERIN 0.4 MG SL SUBL: 0.4000 mg | SUBLINGUAL_TABLET | SUBLINGUAL | Status: DC | PRN

## 2021-10-10 MED ORDER — PROPOFOL 500 MG/50ML IV EMUL
INTRAVENOUS | Status: DC | PRN
  Administered 2021-10-10: 25 ug/kg/min via INTRAVENOUS

## 2021-10-10 MED ORDER — ONDANSETRON HCL 4 MG/2ML IJ SOLN
INTRAMUSCULAR | Status: AC
  Filled 2021-10-10: qty 2

## 2021-10-10 MED ORDER — MAGNESIUM SULFATE 2 GM/50ML IV SOLN: 2.0000 g | Freq: Once | INTRAVENOUS | Status: DC

## 2021-10-10 MED ORDER — LACTATED RINGERS IV SOLN: INTRAVENOUS | Status: DC

## 2021-10-10 MED ORDER — ONDANSETRON HCL 4 MG/2ML IJ SOLN
INTRAMUSCULAR | Status: DC | PRN
  Administered 2021-10-10: 4 mg via INTRAVENOUS

## 2021-10-10 SURGICAL SUPPLY — 49 items
BAG COUNTER SPONGE SURGICOUNT (BAG) ×1 IMPLANT
BAG SPEC THK2 15X12 ZIP CLS (MISCELLANEOUS) ×1
BAG SPNG CNTER NS LX DISP (BAG) ×1
BAG SURGICOUNT SPONGE COUNTING (BAG) ×1
BAG ZIPLOCK 12X15 (MISCELLANEOUS) ×3 IMPLANT
CANISTER PREVENA PLUS 150 (CANNISTER) ×2 IMPLANT
CANISTER WOUND CARE 500ML ATS (WOUND CARE) ×2 IMPLANT
COVER SURGICAL LIGHT HANDLE (MISCELLANEOUS) ×3 IMPLANT
DRAPE INCISE IOBAN 66X45 STRL (DRAPES) ×3 IMPLANT
DRAPE ORTHO SPLIT 77X108 STRL (DRAPES)
DRAPE STERI IOBAN 125X83 (DRAPES) ×2 IMPLANT
DRAPE SURG ORHT 6 SPLT 77X108 (DRAPES) ×2 IMPLANT
DRAPE U-SHAPE 47X51 STRL (DRAPES) ×1 IMPLANT
DRESSING MEPILEX FLEX 4X4 (GAUZE/BANDAGES/DRESSINGS) ×1 IMPLANT
DRSG ADAPTIC 3X8 NADH LF (GAUZE/BANDAGES/DRESSINGS) ×3 IMPLANT
DRSG MEPILEX BORDER 4X8 (GAUZE/BANDAGES/DRESSINGS) ×3 IMPLANT
DRSG MEPILEX FLEX 4X4 (GAUZE/BANDAGES/DRESSINGS) ×3
DRSG TEGADERM 2-3/8X2-3/4 SM (GAUZE/BANDAGES/DRESSINGS) ×2 IMPLANT
DRSG VAC ATS MED SENSATRAC (GAUZE/BANDAGES/DRESSINGS) ×2 IMPLANT
DURAPREP 26ML APPLICATOR (WOUND CARE) ×3 IMPLANT
ELECT REM PT RETURN 15FT ADLT (MISCELLANEOUS) ×3 IMPLANT
EVACUATOR 1/8 PVC DRAIN (DRAIN) ×5 IMPLANT
GAUZE SPONGE 4X4 12PLY STRL (GAUZE/BANDAGES/DRESSINGS) ×3 IMPLANT
GLOVE SRG 8 PF TXTR STRL LF DI (GLOVE) ×1 IMPLANT
GLOVE SURG ENC MOIS LTX SZ6.5 (GLOVE) ×6 IMPLANT
GLOVE SURG ENC MOIS LTX SZ8 (GLOVE) ×6 IMPLANT
GLOVE SURG UNDER POLY LF SZ7 (GLOVE) ×9 IMPLANT
GLOVE SURG UNDER POLY LF SZ8 (GLOVE) ×3
GOWN STRL REUS W/TWL LRG LVL3 (GOWN DISPOSABLE) ×6 IMPLANT
HANDPIECE INTERPULSE COAX TIP (DISPOSABLE) ×3
KIT BASIN OR (CUSTOM PROCEDURE TRAY) ×3 IMPLANT
KIT TURNOVER KIT A (KITS) IMPLANT
MANIFOLD NEPTUNE II (INSTRUMENTS) ×3 IMPLANT
PACK ANTERIOR HIP CUSTOM (KITS) ×2 IMPLANT
PACK TOTAL JOINT (CUSTOM PROCEDURE TRAY) ×1 IMPLANT
PROTECTOR NERVE ULNAR (MISCELLANEOUS) ×3 IMPLANT
SET HNDPC FAN SPRY TIP SCT (DISPOSABLE) ×1 IMPLANT
SPONGE T-LAP 18X18 ~~LOC~~+RFID (SPONGE) ×4 IMPLANT
STAPLER VISISTAT 35W (STAPLE) ×1 IMPLANT
SUT MNCRL AB 4-0 PS2 18 (SUTURE) ×3 IMPLANT
SUT STRATAFIX PDS+ 0 24IN (SUTURE) ×3 IMPLANT
SUT VIC AB 1 CT1 27 (SUTURE) ×6
SUT VIC AB 1 CT1 27XBRD ANTBC (SUTURE) ×3 IMPLANT
SUT VIC AB 2-0 CT1 27 (SUTURE) ×6
SUT VIC AB 2-0 CT1 TAPERPNT 27 (SUTURE) ×3 IMPLANT
SWAB COLLECTION DEVICE MRSA (MISCELLANEOUS) ×3 IMPLANT
SWAB CULTURE ESWAB REG 1ML (MISCELLANEOUS) ×3 IMPLANT
TOWEL OR 17X26 10 PK STRL BLUE (TOWEL DISPOSABLE) ×6 IMPLANT
TUBE SUCTION HIGH CAP CLEAR NV (SUCTIONS) ×2 IMPLANT

## 2021-10-10 NOTE — Plan of Care (Signed)
?  Problem: Education: ?Goal: Knowledge of General Education information will improve ?Description: Including pain rating scale, medication(s)/side effects and non-pharmacologic comfort measures ?Outcome: Progressing ?  ?Problem: Clinical Measurements: ?Goal: Respiratory complications will improve ?Outcome: Progressing ?Goal: Cardiovascular complication will be avoided ?Outcome: Progressing ?  ?Problem: Pain Managment: ?Goal: General experience of comfort will improve ?Outcome: Progressing ?  ?

## 2021-10-10 NOTE — Progress Notes (Signed)
Vitals taken at 0345 by NT showed SpO2 at 84%. Pt adjusted in bed, HOB elevated, and O2 increased to 5L. SpO2 increased to 95%. Pt placed on continuous pulse ox. RN will continue to monitor.

## 2021-10-10 NOTE — Progress Notes (Signed)
Subjective: 1 Day Post-Op Procedure(s) (LRB): IRRIGATION AND DEBRIDEMENT LEFT HIP (Left) Patient reports pain as mild. Seen by Dr. Lequita Halt. Surgery cancelled yesterday due to lovenox dose the day prior (spinal anesthesia preferred due to medical history).  Incision continues to drain with proximal erythema.  Objective: Vital signs in last 24 hours: Temp:  [97.9 F (36.6 C)-99.8 F (37.7 C)] 99.8 F (37.7 C) (12/07 2253) Pulse Rate:  [87-145] 87 (12/08 0345) Resp:  [15-20] 15 (12/08 0345) BP: (121-133)/(58-80) 133/58 (12/08 0345) SpO2:  [93 %-99 %] 94 % (12/08 0345) Weight:  [91.1 kg] 91.1 kg (12/08 0658)  Intake/Output from previous day:  Intake/Output Summary (Last 24 hours) at 10/10/2021 0746 Last data filed at 10/10/2021 0400 Gross per 24 hour  Intake 340 ml  Output 1000 ml  Net -660 ml    Intake/Output this shift: No intake/output data recorded.  Labs: Recent Labs    10/08/21 0413 10/09/21 0406 10/10/21 0413  HGB 7.8* 8.3* 8.8*   Recent Labs    10/09/21 0406 10/10/21 0413  WBC 8.2 9.3  RBC 2.89* 3.01*  HCT 28.6* 29.9*  PLT 272 292   Recent Labs    10/09/21 0406 10/10/21 0413  NA 144 143  K 3.5 3.3*  CL 96* 93*  CO2 40* 41*  BUN 27* 28*  CREATININE 1.09* 1.09*  GLUCOSE 125* 113*  CALCIUM 9.1 9.2   No results for input(s): LABPT, INR in the last 72 hours.  Exam: General - Patient is Alert and Oriented Extremity - Neurologically intact Neurovascular intact Sensation intact distally Dorsiflexion/Plantar flexion intact Dressing/Incision - Mild continued drainage with proximal erythema from pannus Motor Function - intact, moving foot and toes well on exam.   Past Medical History:  Diagnosis Date   Anemia    Arthritis    OA RIGHT KNEE WITH PAIN   Barrett esophagus    BiPAP (biphasic positive airway pressure)    Wears at night   Bradycardia 06/01/2015   CAD in native artery    a. NSTEMI 05/2015 s/p emergent CABG.   Chronic diastolic  (congestive) heart failure (HCC)    Chronic kidney disease, stage 3a (HCC)    Chronic respiratory failure (HCC)    Chronic respiratory failure with hypoxia and hypercapnia (HCC) 02/04/2010   Followed in Pulmonary clinic/ [facility]/ [facility]       - 02 dependent  since 07/02/10 >>  83% RA December 05, 2010       - ONO RA 08/05/12  :  Positive sat < 89 x 2:72m> repeat on 2lpm rec 08/12/2012  - 06/17/2013 reported desat with activity p Knee surgery > rec restart 2lpm with activity  - 06/27/2013   Walked 2lpm  x one lap @ 185 stopped due to sat 88% not sob , desat to 82% on RA just at th   COPD III spirometry if use FEV1/VC p saba  07/18/2010   Quit smoking May 2006       - PFT's  04/12/10 FEV1  1.21 (69%) ratio 77 and no change p B2,  DLC0 56%   VC 70%         - PFTs  08/08/2013 FEV1 1.21 (60%) ratio 86 and no change p B2 DLCO 79%  VC 72%  On symbicort 160 2bid  - PFT's  02/08/2018  FEV1 0.70 (40 % ) ratio 56 if use FEV1/VC  p 38 % improvement from saba p symb 160 prior to study with DLCO  78 %  corrects to 147  % for alv volume   - 02/08/2018   Cough variant asthma 02/26/2011   Followed in Pulmonary clinic/ [facility]/ [facility]  - PFT's  06/04/15  FEV1 1.20 (67 % ) ratio 83  p 6 % improvement from saba with DLCO  80 % corrects to 132 % for alv volume      - Clinical dx based on response to symbicort       FENO 09/16/2016  =   96 on symbicort 160 2bid > added singulair  Allergy profile 09/16/2016 >  Eos 0.5 /  IgE  78 neg RAST  -  Referred to rehab 04/29/2017 > completed   Depression    Dysrhythmia    Afib   Essential hypertension 04/20/2007   Qualifier: Diagnosis of  By: Paulina Fusi, RN, Daine Gravel    GERD (gastroesophageal reflux disease)    History of ARDS 2006   History of home oxygen therapy    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 small intracranial hemorrhage in setting of HTN.   Mild carotid artery disease ([place])    a.  Duplex 1-39% bilaterally in 2016.   Morbid (severe) obesity due to excess calories ([place]) 04/22/2015   pfts with erv 14% 06/04/15  And 33% 02/08/2018    NSTEMI (non-ST elevated myocardial infarction) ([place]) 05/31/2015   Pneumococcal pneumonia ([place]) 2006   HOSPITALIZED AND DEVELOPED ARDS   Psoriasis    Psoriatic arthritis ([place])    PULMONARY FIBROSIS ILD POST INFLAMMATORY CHRONIC 07/18/2010   Followed as Primary Care Patient/ [facility]/ [facility]  -s/p ARDS 2006 with bacteremic S  Pna       - CT chest 07/03/10 Nonspecific PF mostly upper lobes       - CT chest 12/03/10 acute gg changes and effusions c/w chf - PFT's  02/08/2018  FVC 0.64 (28 %)   with DLCO  78 % corrects to 147 % for alv volume      Rhematoid arthritis    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   SOB (shortness of breath) on exertion     Assessment/Plan: 1 Day Post-Op Procedure(s) (LRB): IRRIGATION AND DEBRIDEMENT LEFT HIP (Left) Principal Problem:   Periprosthetic fracture of femur following total replacement of hip Active Problems:   COPD III spirometry if use FEV1/VC p saba    Chronic respiratory failure with hypoxia and hypercapnia (HCC)   Atrial fibrillation (HCC)   Chronic heart failure with preserved ejection fraction (HCC)   Stage 3b chronic kidney disease (HCC)   CAD in native artery   ILD (interstitial lung disease) (HCC)   Fluid collection at surgical site   Periprosthetic fracture around internal prosthetic left hip joint ([place])  Estimated body mass index is 39.22 kg/m as calculated from the following:   Height as of this encounter: 5' (1.524 m).   Weight as of this encounter: 91.1 kg.  WBAT  Plan for OR later today for irrigation and debridement. Will likely place wound vac.  Can have breakfast this AM, then NPO after.  Lovenox discontinued yesterday.  Theresa Duty, PA-C Orthopedic Surgery [phone] 10/10/2021, 7:46 AM

## 2021-10-10 NOTE — Progress Notes (Signed)
Pt HR increased up to 144 bpm at 1648 10/10/21. Per MD order Lopressor 12.5 mg PO given for sustained SR tachycardia. About three hours later HR ran between 100s-120s with atrial fib then increased to high 120s-130s SR tachycardia. RN administered 2.5 mg IV metoprolol per on-call MD order. HR decreased to 80s-100s bpm. Pt asymptomatic throughout HR changes. Pt denies pain.

## 2021-10-10 NOTE — Anesthesia Postprocedure Evaluation (Signed)
Anesthesia Post Note  Patient: [name]  Procedure(s) Performed: IRRIGATION AND DEBRIDEMENT HIP WITH WOUND VAC (Left: Hip)     Patient location during evaluation: PACU Anesthesia Type: Spinal Level of consciousness: oriented and awake and alert Pain management: pain level controlled Vital Signs Assessment: post-procedure vital signs reviewed and stable Respiratory status: spontaneous breathing, respiratory function stable and patient connected to nasal cannula oxygen Cardiovascular status: blood pressure returned to baseline and stable Postop Assessment: no headache, no backache and no apparent nausea or vomiting Anesthetic complications: no   No notable events documented.  Last Vitals:  Vitals:   10/10/21 1915 10/10/21 1930  BP: 96/71 (!) 112/58  Pulse: 89 84  Resp: 19 17  Temp: 36.7 C   SpO2: 100% 99%    Last Pain:  Vitals:   10/10/21 1915  TempSrc:   PainSc: 0-No pain                 [name] S

## 2021-10-10 NOTE — Transfer of Care (Signed)
Immediate Anesthesia Transfer of Care Note  Patient: [name]  Procedure(s) Performed: IRRIGATION AND DEBRIDEMENT HIP WITH WOUND VAC (Left: Hip)  Patient Location: PACU  Anesthesia Type:Spinal  Level of Consciousness: drowsy and patient cooperative  Airway & Oxygen Therapy: Patient Spontanous Breathing and Patient connected to face mask oxygen  Post-op Assessment: Report given to RN and Post -op Vital signs reviewed and stable  Post vital signs: Reviewed and stable  Last Vitals:  Vitals Value Taken Time  BP 96/71 10/10/21 1915  Temp    Pulse 91 10/10/21 1915  Resp 16 10/10/21 1915  SpO2 100 % 10/10/21 1915  Vitals shown include unvalidated device data.  Last Pain:  Vitals:   10/10/21 1650  TempSrc: Oral  PainSc:          Complications: No notable events documented.

## 2021-10-10 NOTE — Anesthesia Procedure Notes (Signed)
Spinal  Patient location during procedure: OR Start time: 10/10/2021 6:04 PM End time: 10/10/2021 6:08 PM Reason for block: surgical anesthesia Staffing Performed: anesthesiologist  Anesthesiologist: Eilene Ghazi, MD Preanesthetic Checklist Completed: patient identified, IV checked, site marked, risks and benefits discussed, surgical consent, monitors and equipment checked, pre-op evaluation and timeout performed Spinal Block Patient position: sitting Prep: Betadine Patient monitoring: heart rate, continuous pulse ox and blood pressure Approach: midline Location: L3-4 Injection technique: single-shot Needle Needle type: Sprotte  Needle gauge: 24 G Needle length: 9 cm Assessment Sensory level: T6 Events: CSF return Additional Notes

## 2021-10-10 NOTE — H&P (View-Only) (Signed)
Subjective: 1 Day Post-Op Procedure(s) (LRB): IRRIGATION AND DEBRIDEMENT LEFT HIP (Left) Patient reports pain as mild. Seen by Dr. Lequita Halt. Surgery cancelled yesterday due to lovenox dose the day prior (spinal anesthesia preferred due to medical history).  Incision continues to drain with proximal erythema.  Objective: Vital signs in last 24 hours: Temp:  [97.9 F (36.6 C)-99.8 F (37.7 C)] 99.8 F (37.7 C) (12/07 2253) Pulse Rate:  [87-145] 87 (12/08 0345) Resp:  [15-20] 15 (12/08 0345) BP: (121-133)/(58-80) 133/58 (12/08 0345) SpO2:  [93 %-99 %] 94 % (12/08 0345) Weight:  [91.1 kg] 91.1 kg (12/08 0658)  Intake/Output from previous day:  Intake/Output Summary (Last 24 hours) at 10/10/2021 0746 Last data filed at 10/10/2021 0400 Gross per 24 hour  Intake 340 ml  Output 1000 ml  Net -660 ml    Intake/Output this shift: No intake/output data recorded.  Labs: Recent Labs    10/08/21 0413 10/09/21 0406 10/10/21 0413  HGB 7.8* 8.3* 8.8*   Recent Labs    10/09/21 0406 10/10/21 0413  WBC 8.2 9.3  RBC 2.89* 3.01*  HCT 28.6* 29.9*  PLT 272 292   Recent Labs    10/09/21 0406 10/10/21 0413  NA 144 143  K 3.5 3.3*  CL 96* 93*  CO2 40* 41*  BUN 27* 28*  CREATININE 1.09* 1.09*  GLUCOSE 125* 113*  CALCIUM 9.1 9.2   No results for input(s): LABPT, INR in the last 72 hours.  Exam: General - Patient is Alert and Oriented Extremity - Neurologically intact Neurovascular intact Sensation intact distally Dorsiflexion/Plantar flexion intact Dressing/Incision - Mild continued drainage with proximal erythema from pannus Motor Function - intact, moving foot and toes well on exam.   Past Medical History:  Diagnosis Date   Anemia    Arthritis    OA RIGHT KNEE WITH PAIN   Barrett esophagus    BiPAP (biphasic positive airway pressure)    Wears at night   Bradycardia 06/01/2015   CAD in native artery    a. NSTEMI 05/2015 s/p emergent CABG.   Chronic diastolic  (congestive) heart failure (HCC)    Chronic kidney disease, stage 3a (HCC)    Chronic respiratory failure (HCC)    Chronic respiratory failure with hypoxia and hypercapnia (HCC) 02/04/2010   [facility]       - 02 dependent  since 07/02/10 >>  83% RA December 05, 2010       - ONO RA 08/05/12  :  Positive sat < 89 x 2:72m> repeat on 2lpm rec 08/12/2012  - 06/17/2013 reported desat with activity p Knee surgery > rec restart 2lpm with activity  - 06/27/2013   Walked 2lpm  x one lap @ 185 stopped due to sat 88% not sob , desat to 82% on RA just at th   COPD III spirometry if use FEV1/VC p saba  07/18/2010   Quit smoking May 2006       - PFT's  04/12/10 FEV1  1.21 (69%) ratio 77 and no change p B2,  DLC0 56%   VC 70%         - PFTs  08/08/2013 FEV1 1.21 (60%) ratio 86 and no change p B2 DLCO 79%  VC 72%  On symbicort 160 2bid  - PFT's  02/08/2018  FEV1 0.70 (40 % ) ratio 56 if use FEV1/VC  p 38 % improvement from saba p symb 160 prior to study with DLCO  78 %  corrects to 147  % for alv volume   - 02/08/2018   Cough variant asthma 02/26/2011   Followed in Pulmonary clinic/ [facility]/ [facility]  - PFT's  06/04/15  FEV1 1.20 (67 % ) ratio 83  p 6 % improvement from saba with DLCO  80 % corrects to 132 % for alv volume      - Clinical dx based on response to symbicort       FENO 09/16/2016  =   96 on symbicort 160 2bid > added singulair  Allergy profile 09/16/2016 >  Eos 0.5 /  IgE  78 neg RAST  -  Referred to rehab 04/29/2017 > completed   Depression    Dysrhythmia    Afib   Essential hypertension 04/20/2007   Qualifier: Diagnosis of  By: Paulina Fusi, RN, Daine Gravel    GERD (gastroesophageal reflux disease)    History of ARDS 2006   History of home oxygen therapy    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 small intracranial hemorrhage in setting of HTN.   Mild carotid artery disease ([place])    a.  Duplex 1-39% bilaterally in 2016.   Morbid (severe) obesity due to excess calories ([place]) 04/22/2015   pfts with erv 14% 06/04/15  And 33% 02/08/2018    NSTEMI (non-ST elevated myocardial infarction) ([place]) 05/31/2015   Pneumococcal pneumonia ([place]) 2006   HOSPITALIZED AND DEVELOPED ARDS   Psoriasis    Psoriatic arthritis ([place])    PULMONARY FIBROSIS ILD POST INFLAMMATORY CHRONIC 07/18/2010   Followed as Primary Care Patient/ [facility]/ [facility]  -s/p ARDS 2006 with bacteremic S  Pna       - CT chest 07/03/10 Nonspecific PF mostly upper lobes       - CT chest 12/03/10 acute gg changes and effusions c/w chf - PFT's  02/08/2018  FVC 0.64 (28 %)   with DLCO  78 % corrects to 147 % for alv volume      Rhematoid arthritis    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   SOB (shortness of breath) on exertion     Assessment/Plan: 1 Day Post-Op Procedure(s) (LRB): IRRIGATION AND DEBRIDEMENT LEFT HIP (Left) Principal Problem:   Periprosthetic fracture of femur following total replacement of hip Active Problems:   COPD III spirometry if use FEV1/VC p saba    Chronic respiratory failure with hypoxia and hypercapnia (HCC)   Atrial fibrillation (HCC)   Chronic heart failure with preserved ejection fraction (HCC)   Stage 3b chronic kidney disease (HCC)   CAD in native artery   ILD (interstitial lung disease) (HCC)   Fluid collection at surgical site   Periprosthetic fracture around internal prosthetic left hip joint ([place])  Estimated body mass index is 39.22 kg/m as calculated from the following:   Height as of this encounter: 5' (1.524 m).   Weight as of this encounter: 91.1 kg.  WBAT  Plan for OR later today for irrigation and debridement. Will likely place wound vac.  Can have breakfast this AM, then NPO after.  Lovenox discontinued yesterday.  Theresa Duty, PA-C Orthopedic Surgery [phone] 10/10/2021, 7:46 AM

## 2021-10-10 NOTE — Brief Op Note (Signed)
10/06/2021 - 10/10/2021  7:12 PM  PATIENT:  [name]  75 y.o. female  PRE-OPERATIVE DIAGNOSIS: Hematoma left hip  POST-OPERATIVE DIAGNOSIS:  Hematoma left hip  PROCEDURE:  Procedure(s): IRRIGATION AND DEBRIDEMENT HIP WITH WOUND VAC (Left)  SURGEON:  Surgeon(s) and Role:    Ollen Gross, MD - Primary  PHYSICIAN ASSISTANT:   ASSISTANTS: none   ANESTHESIA:   spinal  EBL:  25 mL   BLOOD ADMINISTERED:none  DRAINS: (Medium) Hemovact drain(s) in the left hip with  Suction Open   LOCAL MEDICATIONS USED:  NONE  COUNTS:  YES  DICTATION: .Other Dictation: Dictation Number [id number]  PLAN OF CARE: Admit to inpatient   PATIENT DISPOSITION:  PACU - hemodynamically stable.

## 2021-10-10 NOTE — Progress Notes (Signed)
Pt went to surgery (IRRIGATION AND DEBRIDEMENT LEFT HIP). Pt in post-op at shift chang. Report given to on coming nurse.

## 2021-10-10 NOTE — Progress Notes (Signed)
PROGRESS NOTE    [name]  [id number] DOB: 1945/11/29 DOA: 10/06/2021 PCP: Isaac Bliss, Rayford Halsted, MD    Brief Narrative:  This 75 y.o. female with PMH significant for CAD, HFpEF, atrial fibrillation on Eliquis, CKD IIIb, ILD, COPD, OSA, chronic hypoxic and hypercarbic respiratory failure, RA, and osteoarthritis status post left total hip arthroplasty on 09/16/2021 who presented to [facility] on 10/05/2021 with increasing left hip pain.  CT of the left hip concerning for periprosthetic intertrochanteric left femur fracture and periprosthetic fluid collections.  CT chest negative for PE.  She was seen by Dr. Wynelle Link and is recommending nonsurgical management due to multiple comorbidities.  Patient is admitted for Periprosthetic hip fracture with fluid collection.  Orthopedics has decided possible debridement 12/8.  Assessment & Plan:   Principal Problem:   Periprosthetic fracture of femur following total replacement of hip Active Problems:   COPD III spirometry if use FEV1/VC p saba    Chronic respiratory failure with hypoxia and hypercapnia (HCC)   Atrial fibrillation (HCC)   Chronic heart failure with preserved ejection fraction (HCC)   Stage 3b chronic kidney disease (HCC)   CAD in native artery   ILD (interstitial lung disease) (HCC)   Fluid collection at surgical site   Periprosthetic fracture around internal prosthetic left hip joint ([place])   Left hip pain secondary to periprosthetic hip fracture: Patient s/p left total hip arthroplasty on 09/16/2021. She now complains of increasing left hip pain and drainage. CT scan done in an outside hospital concerning for periprosthetic fracture and fluid collections. Patient seen by Ortho and recommended nonoperative treatment with TTWB initially. Continue IV Ancef for now.  Given increased drainage and fluid pocket seen on CT scan,  Ortho recommended it is best to proceed with an irrigation and debridement of the  wound on 10/09/21.  Procedure did not happen yesterday since patient has received Lovenox. Now Patient is scheduled for irrigation and debridement on 10/10/21. Follow-up postoperative recommendation.  Chronic hypoxic and hypercapnic respiratory failure sec. to COPD, OSA, ILD: Chest x-ray 10/07/2021 cardiomegaly with interstitial edema and pleural effusion.   Continue torsemide 20 mg twice a day. Continue nebulizer,  supplemental oxygen and BiPAP nightly.   Chronic atrial fibrillation: Heart rate controlled.  Continue Cardizem and Tikosyn. Will hold Eliquis as she is going to have debridement. Continue Lovenox in the meanwhile.  Chronic diastolic heart failure: Last LVEF 65 to 70% in 10/22 Continue torsemide.  AKI on CKD stage IIIb: Monitor renal functions on torsemide Renal functions back to baseline.  Rheumatoid arthritis: Continue leflunomide  Anemia of chronic disease: Hemoglobin currently stable,  Continue to monitor H&H  History of CAD and CABG: Continue Lipitor      DVT prophylaxis: Lovenox Code Status: DNR Family Communication: No family at bedside Disposition Plan:   Status is: Inpatient  Remains inpatient appropriate because: Possible fluid collection requiring debridement and incision and drainage.  Consultants:  Orthopedics  Procedures: Debridement and incision and drainage Antimicrobials:  Anti-infectives (From admission, onward)    Start     Dose/Rate Route Frequency Ordered Stop   10/09/21 1300  ceFAZolin (ANCEF) IVPB 2g/100 mL premix  Status:  Discontinued        2 g 200 mL/hr over 30 Minutes Intravenous On call to O.R. 10/09/21 1239 10/09/21 1322   10/08/21 1330  ceFEPIme (MAXIPIME) 2 g in sodium chloride 0.9 % 100 mL IVPB        2 g 200 mL/hr over 30 Minutes  Intravenous Every 12 hours 10/08/21 1237     10/07/21 1700  ceFAZolin (ANCEF) IVPB 2g/100 mL premix  Status:  Discontinued        2 g 200 mL/hr over 30 Minutes Intravenous Every 8 hours  10/07/21 1614 10/08/21 1237        Subjective: Patient was seen and examined at bedside.  Overnight events noted. Patient reports pain is manageable with pain medications.   She was supposed to have debridement yesterday but it could not happen because she has received Lovenox.   Objective: Vitals:   10/10/21 0658 10/10/21 0917 10/10/21 0957 10/10/21 1239  BP:   139/70 135/64  Pulse:   84 84  Resp:    18  Temp:    99.4 F (37.4 C)  TempSrc:    Oral  SpO2:  98%  98%  Weight: 91.1 kg     Height:        Intake/Output Summary (Last 24 hours) at 10/10/2021 1356 Last data filed at 10/10/2021 0900 Gross per 24 hour  Intake 340 ml  Output 1200 ml  Net -860 ml   Filed Weights   10/06/21 2230 10/09/21 0425 10/10/21 0658  Weight: 92 kg 92.2 kg 91.1 kg    Examination:  General exam: Appears comfortable, morbidly obese, lying flat on the bed, deconditioned Respiratory system: Clear to auscultation. Respiratory effort normal.  RR 16 Cardiovascular system: S1-S2 heard, regular rate and rhythm, no murmur.   Gastrointestinal system: Abdomen is soft, non tender, nondistended, BS+ Central nervous system: Alert and oriented x3. No focal neurological deficits. Extremities: Left hip tenderness noted, dressing noted soaked in discharge. Skin: No rashes, lesions or ulcers Psychiatry: Judgement and insight appear normal. Mood & affect appropriate.     Data Reviewed: I have personally reviewed following labs and imaging studies  CBC: Recent Labs  Lab 10/07/21 0010 10/08/21 0413 10/09/21 0406 10/10/21 0413  WBC 15.1* 10.5 8.2 9.3  HGB 7.9* 7.8* 8.3* 8.8*  HCT 26.7* 26.7* 28.6* 29.9*  MCV 98.5 99.3 99.0 99.3  PLT 289 276 272 [id number]   Basic Metabolic Panel: Recent Labs  Lab 10/07/21 0010 10/08/21 0413 10/09/21 0406 10/10/21 0413  NA 141 144 144 143  K 3.6 4.1 3.5 3.3*  CL 97* 100 96* 93*  CO2 36* 36* 40* 41*  GLUCOSE 168* 116* 125* 113*  BUN 35* 35* 27* 28*  CREATININE  1.56* 1.50* 1.09* 1.09*  CALCIUM 8.7* 8.8* 9.1 9.2  MG 2.0  --   --  1.7  PHOS  --   --   --  2.5   GFR: Estimated Creatinine Clearance: 45.5 mL/min (A) (by C-G formula based on SCr of 1.09 mg/dL (H)). Liver Function Tests: Recent Labs  Lab 10/07/21 0010  AST 16  ALT 12  ALKPHOS 74  BILITOT 0.5  PROT 6.5  ALBUMIN 2.7*   No results for input(s): LIPASE, AMYLASE in the last 168 hours. No results for input(s): AMMONIA in the last 168 hours. Coagulation Profile: No results for input(s): INR, PROTIME in the last 168 hours. Cardiac Enzymes: No results for input(s): CKTOTAL, CKMB, CKMBINDEX, TROPONINI in the last 168 hours. BNP (last 3 results) Recent Labs    06/13/21 1303  PROBNP 588*   HbA1C: No results for input(s): HGBA1C in the last 72 hours. CBG: No results for input(s): GLUCAP in the last 168 hours. Lipid Profile: No results for input(s): CHOL, HDL, LDLCALC, TRIG, CHOLHDL, LDLDIRECT in the last 72 hours. Thyroid Function Tests: No  results for input(s): TSH, T4TOTAL, FREET4, T3FREE, THYROIDAB in the last 72 hours. Anemia Panel: No results for input(s): VITAMINB12, FOLATE, FERRITIN, TIBC, IRON, RETICCTPCT in the last 72 hours. Sepsis Labs: Recent Labs  Lab 10/07/21 0010 10/08/21 0413  PROCALCITON 0.19 <0.10    No results found for this or any previous visit (from the past 240 hour(s)).   Radiology Studies: No results found.  Scheduled Meds:  arformoterol  15 mcg Nebulization Q12H   atorvastatin  40 mg Oral Daily   budesonide  0.25 mg Nebulization BID   cholecalciferol  2,000 Units Oral Daily   diltiazem  240 mg Oral Daily   dofetilide  250 mcg Oral BID   DULoxetine  30 mg Oral Daily   leflunomide  20 mg Oral Daily   levothyroxine  125 mcg Oral Q0600   metoprolol tartrate  12.5 mg Oral BID   montelukast  10 mg Oral Daily   pantoprazole  40 mg Oral QAC breakfast   pregabalin  50 mg Oral QHS   sodium chloride flush  3 mL Intravenous Q12H   torsemide  20  mg Oral BID   umeclidinium bromide  1 puff Inhalation Daily   Continuous Infusions:  sodium chloride     ceFEPime (MAXIPIME) IV 2 g (10/10/21 1010)     LOS: 4 days    Time spent: 25 mins    Summer Mccolgan, MD [facility]   If 7PM-7AM, please contact night-coverage

## 2021-10-10 NOTE — Interval H&P Note (Signed)
History and Physical Interval Note:  10/10/2021 5:56 PM  [name]  has presented today for surgery, with the diagnosis of infection left hip.  The various methods of treatment have been discussed with the patient and family. After consideration of risks, benefits and other options for treatment, the patient has consented to  Procedure(s): IRRIGATION AND DEBRIDEMENT HIP (Left) as a surgical intervention.  The patient's history has been reviewed, patient examined, no change in status, stable for surgery.  I have reviewed the patient's chart and labs.  Questions were answered to the patient's satisfaction.     Homero Fellers Bazil Dhanani

## 2021-10-10 NOTE — Care Management Important Message (Signed)
Important Message  Patient Details IM Letter given to the Patient. Name: [name] MRN: [id number] Date of Birth: September 27, 1946   Medicare Important Message Given:  Yes     Caren Macadam 10/10/2021, 12:07 PM

## 2021-10-10 NOTE — Progress Notes (Signed)
ANTICOAGULATION CONSULT NOTE - Initial Consult  Pharmacy Consult for apixaban Indication: atrial fibrillation  Allergies  Allergen Reactions   Gabapentin Other (See Comments)    Dizziness, lighthead    Patient Measurements: Height: 5' (152.4 cm) Weight: 91.1 kg (200 lb 13.4 oz) IBW/kg (Calculated) : 45.5  Vital Signs: Temp: 98 F (36.7 C) (12/08 1915) Temp Source: Oral (12/08 1650) BP: 96/71 (12/08 1915) Pulse Rate: 89 (12/08 1915)  Labs: Recent Labs    10/08/21 0413 10/09/21 0406 10/10/21 0413  HGB 7.8* 8.3* 8.8*  HCT 26.7* 28.6* 29.9*  PLT 276 272 292  CREATININE 1.50* 1.09* 1.09*    Estimated Creatinine Clearance: 45.5 mL/min (A) (by C-G formula based on SCr of 1.09 mg/dL (H)).   Medical History: Past Medical History:  Diagnosis Date   Anemia    Arthritis    OA RIGHT KNEE WITH PAIN   Barrett esophagus    BiPAP (biphasic positive airway pressure)    Wears at night   Bradycardia 06/01/2015   CAD in native artery    a. NSTEMI 05/2015 s/p emergent CABG.   Chronic diastolic (congestive) heart failure (HCC)    Chronic kidney disease, stage 3a (HCC)    Chronic respiratory failure (HCC)    Chronic respiratory failure with hypoxia and hypercapnia (HCC) 02/04/2010   Followed in Pulmonary clinic/ [facility]/ [facility]       - 02 dependent  since 07/02/10 >>  83% RA December 05, 2010       - ONO RA 08/05/12  :  Positive sat < 89 x 2:41m> repeat on 2lpm rec 08/12/2012  - 06/17/2013 reported desat with activity p Knee surgery > rec restart 2lpm with activity  - 06/27/2013   Walked 2lpm  x one lap @ 185 stopped due to sat 88% not sob , desat to 82% on RA just at th   COPD III spirometry if use FEV1/VC p saba  07/18/2010   Quit smoking May 2006       - PFT's  04/12/10 FEV1  1.21 (69%) ratio 77 and no change p B2,  DLC0 56%   VC 70%         - PFTs  08/08/2013 FEV1 1.21 (60%) ratio 86 and no change p B2 DLCO 79%  VC 72%  On symbicort 160 2bid  - PFT's  02/08/2018  FEV1 0.70 (40 % )  ratio 56 if use FEV1/VC  p 38 % improvement from saba p symb 160 prior to study with DLCO  78 % corrects to 147  % for alv volume   - 02/08/2018   Cough variant asthma 02/26/2011   Followed in Pulmonary clinic/ [facility]/ [facility]  - PFT's  06/04/15  FEV1 1.20 (67 % ) ratio 83  p 6 % improvement from saba with DLCO  80 % corrects to 132 % for alv volume      - Clinical dx based on response to symbicort       FENO 09/16/2016  =   96 on symbicort 160 2bid > added singulair  Allergy profile 09/16/2016 >  Eos 0.5 /  IgE  78 neg RAST  -  Referred to rehab 04/29/2017 > completed   Depression    Dysrhythmia    Afib   Essential hypertension 04/20/2007   Qualifier: Diagnosis of  By: Marcelyn Ditty, RN, Katy Fitch    GERD (gastroesophageal reflux disease)    History of ARDS 2006   History of home oxygen therapy  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 small intracranial hemorrhage in setting of HTN.   Mild carotid artery disease ([place])    a. Duplex 1-39% bilaterally in 2016.   Morbid (severe) obesity due to excess calories ([place]) 04/22/2015   pfts with erv 14% 06/04/15  And 33% 02/08/2018    NSTEMI (non-ST elevated myocardial infarction) ([place]) 05/31/2015   Pneumococcal pneumonia ([place]) 2006   HOSPITALIZED AND DEVELOPED ARDS   Psoriasis    Psoriatic arthritis ([place])    PULMONARY FIBROSIS ILD POST INFLAMMATORY CHRONIC 07/18/2010   Followed as Primary Care Patient/ [facility]/ [facility]  -s/p ARDS 2006 with bacteremic S  Pna       - CT chest 07/03/10 Nonspecific PF mostly upper lobes       - CT chest 12/03/10 acute gg changes and effusions c/w chf - PFT's  02/08/2018  FVC 0.64 (28 %)   with DLCO  78 % corrects to 147 % for alv volume      Rhematoid arthritis    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 Patient was on enoxaparin bridging which was stopped prior to I&D/wound vac today. Pharmacy consulted to resume apixaban 12/9.   Plan:  Resume apixaban 5 mg bid tomorrow.   Will sign off and follow remotely.   Ulice Dash D 10/10/2021,7:29 PM

## 2021-10-10 NOTE — Anesthesia Preprocedure Evaluation (Addendum)
Anesthesia Evaluation  Patient identified by MRN, date of birth, ID band Patient awake    Reviewed: Allergy & Precautions, H&P , NPO status , Patient's Chart, lab work & pertinent test results  Airway Mallampati: II  TM Distance: >3 FB Neck ROM: Full    Dental no notable dental hx.    Pulmonary asthma , sleep apnea (bipaP) , COPD (2L),  oxygen dependent, former smoker,    breath sounds clear to auscultation + decreased breath sounds      Cardiovascular hypertension, + CAD, + Past MI and + CABG (2016)  Normal cardiovascular exam+ dysrhythmias Atrial Fibrillation  Rhythm:Regular Rate:Normal  ECHO 08/07/21:  1. Left ventricular ejection fraction, by estimation, is 65 to 70%. The  left ventricle has normal function. The left ventricle has no regional  wall motion abnormalities. There is mild left ventricular hypertrophy.  Left ventricular diastolic parameters  are consistent with Grade II diastolic dysfunction (pseudonormalization).  2. Right ventricular systolic function is normal. The right ventricular  size is normal.  3. Left atrial size was mildly dilated.  4. The mitral valve is grossly normal. Trivial mitral valve  regurgitation. Moderate mitral annular calcification.  5. The aortic valve is grossly normal. Aortic valve regurgitation is not  visualized. No aortic stenosis is present.    Neuro/Psych PSYCHIATRIC DISORDERS Depression  Neuromuscular disease (sciatica) negative neurological ROS  negative psych ROS   GI/Hepatic Neg liver ROS, GERD  ,  Endo/Other  Hypothyroidism   Renal/GU Renal InsufficiencyRenal disease  negative genitourinary   Musculoskeletal  (+) Arthritis  (psoriatic),   Abdominal   Peds negative pediatric ROS (+)  Hematology negative hematology ROS (+) anemia ,   Anesthesia Other Findings   Reproductive/Obstetrics negative OB ROS                            Anesthesia Physical Anesthesia Plan  ASA: 4  Anesthesia Plan: Spinal   Post-op Pain Management:    Induction: Intravenous  PONV Risk Score and Plan: 2 and Ondansetron, Propofol infusion and Treatment may vary due to age or medical condition  Airway Management Planned: Simple Face Mask  Additional Equipment: None  Intra-op Plan:   Post-operative Plan:   Informed Consent: I have reviewed the patients History and Physical, chart, labs and discussed the procedure including the risks, benefits and alternatives for the proposed anesthesia with the patient or authorized representative who has indicated his/her understanding and acceptance.   Patient has DNR.   Dental advisory given  Plan Discussed with: CRNA and Surgeon  Anesthesia Plan Comments: (Last dose of eliquis 12/4 Last dose of lovenox 12/6 PM.   Notes from [place] state prefers patient to have a spinal given co-morbidities. Tanna Furry, MD  )       Anesthesia Quick Evaluation

## 2021-10-11 ENCOUNTER — Encounter (HOSPITAL_COMMUNITY): Payer: Self-pay | Admitting: Orthopedic Surgery

## 2021-10-11 DIAGNOSIS — S7002XD Contusion of left hip, subsequent encounter: Secondary | ICD-10-CM

## 2021-10-11 LAB — BASIC METABOLIC PANEL
Anion gap: 10 (ref 5–15)
BUN: 36 mg/dL — ABNORMAL HIGH (ref 8–23)
CO2: 38 mmol/L — ABNORMAL HIGH (ref 22–32)
Calcium: 8.7 mg/dL — ABNORMAL LOW (ref 8.9–10.3)
Chloride: 96 mmol/L — ABNORMAL LOW (ref 98–111)
Creatinine, Ser: 1.37 mg/dL — ABNORMAL HIGH (ref 0.44–1.00)
GFR, Estimated: 41 mL/min — ABNORMAL LOW (ref >60)
Glucose, Bld: 167 mg/dL — ABNORMAL HIGH (ref 70–99)
Potassium: 4.1 mmol/L (ref 3.5–5.1)
Sodium: 144 mmol/L (ref 135–145)

## 2021-10-11 LAB — CBC
HCT: 27.9 % — ABNORMAL LOW (ref 36.0–46.0)
Hemoglobin: 8.1 g/dL — ABNORMAL LOW (ref 12.0–15.0)
MCH: 28.9 pg (ref 26.0–34.0)
MCHC: 29 g/dL — ABNORMAL LOW (ref 30.0–36.0)
MCV: 99.6 fL (ref 80.0–100.0)
Platelets: 293 10*3/uL (ref 150–400)
RBC: 2.8 MIL/uL — ABNORMAL LOW (ref 3.87–5.11)
RDW: 15.6 % — ABNORMAL HIGH (ref 11.5–15.5)
WBC: 8.3 10*3/uL (ref 4.0–10.5)
nRBC: 0 % (ref 0.0–0.2)

## 2021-10-11 LAB — MAGNESIUM: Magnesium: 1.8 mg/dL (ref 1.7–2.4)

## 2021-10-11 MED ORDER — MAGNESIUM SULFATE 2 GM/50ML IV SOLN: 2.0000 g | Freq: Once | INTRAVENOUS | Status: DC

## 2021-10-11 NOTE — Op Note (Signed)
NAMELEONILA, [name] MEDICAL RECORD NO: [id number] ACCOUNT NO: [id number] DATE OF BIRTH: [birth year] FACILITY: [facility] LOCATION: [facility] PHYSICIAN: Gus Rankin. Leeman Johnsey, MD  Operative Report   DATE OF PROCEDURE: 10/10/2021  PREOPERATIVE DIAGNOSIS:  Left hip hematoma/seroma.  POSTOPERATIVE DIAGNOSIS:  Left hip hematoma/seroma.  PROCEDURE:  Irrigation and debridement left hip with placement of wound VAC.  SURGEON:  Gus Rankin. Yuka Lallier, MD  ASSISTANT:  None.  ANESTHESIA:  Spinal.  ESTIMATED BLOOD LOSS:  25 mL.  DRAINS:  Hemovac x 1 and wound VAC.  COMPLICATIONS:  None.  CONDITION:  Stable to recovery.  BRIEF CLINICAL NOTE:  The patient is a 75 year old female who had a left total hip arthroplasty done approximately 3 weeks ago. This past weekend, she developed wound drainage.  She was seen initially at [facility] and transferred to [facility] for  management.  A CT scan showed a periprosthetic fracture and a hematoma.  She has had IV antibiotics with resolution of her white count.  She had increased drainage from the incision yesterday and it was decided to perform irrigation and debridement.  PROCEDURE IN DETAIL:  After successful administration of spinal anesthetic, the boots for the Hana table were placed on both feet and the patient is placed onto the Hana bed with the boots in the traction apparatus.  Traction is not applied at all during  the procedure.  She was placed in the supine position.  Thigh is isolated from her perineum with plastic drapes and prepped and draped in the usual sterile fashion.  I reutilized her previous skin incision, skin cut with a 10 blade to the subcutaneous  tissue.  There was evidence of a fluid collection in the subcutaneous tissue.  Seropurulent in nature.  It was sent for Gram stain, culture and sensitivity, aerobic and anaerobic cultures.  There was evidence of fat necrosis, but no evidence of any other  tissue necrosis.  I debrided this with a  rongeur and knife back to normal-appearing subcutaneous tissue.  I also used the knife to incise along the skin edges, removing 5 mm of skin from the medial and lateral skin edges to be able to approximate more  healthy tissue for the closure.  The length of the incision is 3 inches.  I thoroughly irrigated this with a liter of saline using pulsatile lavage.  All the tissue had a healthy appearance now.  I then used cautery to incise the fascia and manually  dissected down to the joint.  No other fluid collections are identified.  All the tissue had a healthy appearance.  I thoroughly irrigated with 2 more liters of saline using pulsatile lavage.  Fascia was then reapproximated with a Stratafix suture.   Subcutaneous was closed over a Hemovac drain with interrupted 2-0 Vicryl.  I closed the skin with staples.  We then placed a sponge for the wound VAC, obtained an airtight seal and hooked the VAC up to 125 mmHg of suction.  It had an excellent seal.  The  Hemovac drain was also hooked up to suction and charged.  The drapes were removed and the patient was placed back on to the hospital bed, awakened, and transported to recovery in stable condition.   VAI D: 10/10/2021 7:16:46 pm T: 10/11/2021 12:11:00 am  JOB: [id number]/ [id number]

## 2021-10-11 NOTE — Progress Notes (Addendum)
PROGRESS NOTE    [name]  [id number] DOB: June 05, 1946 DOA: 10/06/2021 PCP: Isaac Bliss, Rayford Halsted, MD    Brief Narrative:  This 75 y.o. female with PMH significant for CAD, HFpEF, atrial fibrillation on Eliquis, CKD IIIb, ILD, COPD, OSA, chronic hypoxic and hypercarbic respiratory failure, RA, and osteoarthritis status post left total hip arthroplasty on 09/16/2021 who presented to [facility] on 10/05/2021 with increasing left hip pain.  CT of the left hip concerning for periprosthetic intertrochanteric left femur fracture and periprosthetic fluid collections.  CT chest negative for PE.  She was seen by Dr. Wynelle Link and is recommending nonsurgical management due to multiple comorbidities.  Patient is admitted for Periprosthetic hip fracture with fluid collection.  She underwent irrigation and debridement of left hip with wound VAC on 12/8.  Assessment & Plan:   Principal Problem:   Hematoma of left hip Active Problems:   COPD III spirometry if use FEV1/VC p saba    Chronic respiratory failure with hypoxia and hypercapnia (HCC)   Atrial fibrillation (HCC)   Chronic heart failure with preserved ejection fraction (HCC)   Stage 3b chronic kidney disease (HCC)   CAD in native artery   ILD (interstitial lung disease) ([place])   Periprosthetic fracture of femur following total replacement of hip   Fluid collection at surgical site   Periprosthetic fracture around internal prosthetic left hip joint ([place])   Left hip pain secondary to periprosthetic hip fracture: Patient s/p left total hip arthroplasty on 09/16/2021. She now complains of increasing left hip pain and drainage. CT scan done in an outside hospital concerning for periprosthetic fracture and fluid collections.  Patient seen by Ortho and recommended nonoperative treatment with TTWB initially. Continue IV cefepime.  Given increased drainage and fluid pocket seen on CT scan,  Ortho recommended it is best to  proceed with an irrigation and debridement of the wound on 10/10/21.   She underwent irrigation and debridement of left hip with wound VAC on 12/8. Continue current management , plan for likely discharge on Monday.  Chronic hypoxic and hypercapnic respiratory failure sec. to COPD, OSA, ILD: Chest x-ray on 10/07/2021 showed cardiomegaly with interstitial edema and pleural effusion.   Continue torsemide 20 mg twice a day. Continue nebulizer,  supplemental oxygen and BiPAP nightly.   Chronic atrial fibrillation: Heart rate controlled.  Continue Cardizem and Tikosyn. Eliquis is on hold as she was going to have debridement. Continue Lovenox in the meanwhile. Will resume Eliquis.  Chronic diastolic heart failure: Last LVEF 65 to 70% in 10/22 Continue torsemide.  AKI on CKD stage IIIb: Monitor renal functions on torsemide Serum creatinine slightly up to 1.37  Rheumatoid arthritis: Continue leflunomide  Anemia of chronic disease: Hemoglobin currently stable,  Continue to monitor H&H  History of CAD and CABG: Continue Lipitor      DVT prophylaxis: Lovenox Code Status: DNR Family Communication: No family at bedside Disposition Plan:   Status is: Inpatient  Remains inpatient appropriate because: Possible fluid collection requiring debridement and incision and drainage.  Potential discharge home on Monday 12/12.  Consultants:  Orthopedics  Procedures: Debridement and incision and drainage Antimicrobials:  Anti-infectives (From admission, onward)    Start     Dose/Rate Route Frequency Ordered Stop   10/09/21 1300  ceFAZolin (ANCEF) IVPB 2g/100 mL premix  Status:  Discontinued        2 g 200 mL/hr over 30 Minutes Intravenous On call to O.R. 10/09/21 1239 10/09/21 1322   10/08/21 1330  ceFEPIme (MAXIPIME) 2 g in sodium chloride 0.9 % 100 mL IVPB        2 g 200 mL/hr over 30 Minutes Intravenous Every 12 hours 10/08/21 1237     10/07/21 1700  ceFAZolin (ANCEF) IVPB 2g/100 mL  premix  Status:  Discontinued        2 g 200 mL/hr over 30 Minutes Intravenous Every 8 hours 10/07/21 1614 10/08/21 1237        Subjective: Patient was seen and examined at bedside.  Overnight events noted. Patient is s/p debridement and irrigation with wound VAC. She reports pain is manageable.  She is hoping to be discharged on Monday.   Objective: Vitals:   10/11/21 0500 10/11/21 0525 10/11/21 0839 10/11/21 0937  BP:  (!) 144/47  129/70  Pulse:  69  84  Resp:  16  18  Temp:  99.4 F (37.4 C)  99 F (37.2 C)  TempSrc:  Oral  Oral  SpO2:  99% 96% 98%  Weight: 92.3 kg     Height:        Intake/Output Summary (Last 24 hours) at 10/11/2021 1301 Last data filed at 10/11/2021 0940 Gross per 24 hour  Intake 643 ml  Output 25 ml  Net 618 ml   Filed Weights   10/10/21 0658 10/10/21 1658 10/11/21 0500  Weight: 91.1 kg 91.1 kg 92.3 kg    Examination:  General exam: Morbidly obese, deconditioned, not in any distress, lying on the bed. Respiratory system: Clear to auscultation.  Respiratory effort normal.  RR 16 Cardiovascular system: S1-S2 heard, regular rate and rhythm, no murmur.   Gastrointestinal system: Abdomen is soft, non tender, nondistended, BS+ Central nervous system: Alert and oriented x3. No focal neurological deficits. Extremities: Left hip tenderness noted, wound VAC noted with good suction. Skin: No rashes, lesions or ulcers Psychiatry: Judgement and insight appear normal. Mood & affect appropriate.     Data Reviewed: I have personally reviewed following labs and imaging studies  CBC: Recent Labs  Lab 10/07/21 0010 10/08/21 0413 10/09/21 0406 10/10/21 0413 10/11/21 0435  WBC 15.1* 10.5 8.2 9.3 8.3  HGB 7.9* 7.8* 8.3* 8.8* 8.1*  HCT 26.7* 26.7* 28.6* 29.9* 27.9*  MCV 98.5 99.3 99.0 99.3 99.6  PLT 289 276 272 292 0000000   Basic Metabolic Panel: Recent Labs  Lab 10/07/21 0010 10/08/21 0413 10/09/21 0406 10/10/21 0413 10/11/21 0435  NA 141 144  144 143 144  K 3.6 4.1 3.5 3.3* 4.1  CL 97* 100 96* 93* 96*  CO2 36* 36* 40* 41* 38*  GLUCOSE 168* 116* 125* 113* 167*  BUN 35* 35* 27* 28* 36*  CREATININE 1.56* 1.50* 1.09* 1.09* 1.37*  CALCIUM 8.7* 8.8* 9.1 9.2 8.7*  MG 2.0  --   --  1.7 1.8  PHOS  --   --   --  2.5  --    GFR: Estimated Creatinine Clearance: 36.5 mL/min (A) (by C-G formula based on SCr of 1.37 mg/dL (H)). Liver Function Tests: Recent Labs  Lab 10/07/21 0010  AST 16  ALT 12  ALKPHOS 74  BILITOT 0.5  PROT 6.5  ALBUMIN 2.7*   No results for input(s): LIPASE, AMYLASE in the last 168 hours. No results for input(s): AMMONIA in the last 168 hours. Coagulation Profile: No results for input(s): INR, PROTIME in the last 168 hours. Cardiac Enzymes: No results for input(s): CKTOTAL, CKMB, CKMBINDEX, TROPONINI in the last 168 hours. BNP (last 3 results) Recent Labs  06/13/21 1303  PROBNP 588*   HbA1C: No results for input(s): HGBA1C in the last 72 hours. CBG: No results for input(s): GLUCAP in the last 168 hours. Lipid Profile: No results for input(s): CHOL, HDL, LDLCALC, TRIG, CHOLHDL, LDLDIRECT in the last 72 hours. Thyroid Function Tests: No results for input(s): TSH, T4TOTAL, FREET4, T3FREE, THYROIDAB in the last 72 hours. Anemia Panel: No results for input(s): VITAMINB12, FOLATE, FERRITIN, TIBC, IRON, RETICCTPCT in the last 72 hours. Sepsis Labs: Recent Labs  Lab 10/07/21 0010 10/08/21 0413  PROCALCITON 0.19 <0.10    Recent Results (from the past 240 hour(s))  Aerobic/Anaerobic Culture w Gram Stain (surgical/deep wound)     Status: None (Preliminary result)   Collection Time: 10/10/21  6:31 PM   Specimen: Synovial, Left Hip; Body Fluid  Result Value Ref Range Status   Specimen Description   Final    SYNOVIAL LEFT HIP Performed at [facility], [address] [place], [place] [address]    Special Requests   Final    NONE Performed at [facility], [address] [place], [place] [address]    Gram Stain   Final    MODERATE WBC PRESENT,BOTH PMN AND MONONUCLEAR MODERATE GRAM POSITIVE COCCI    Culture   Final    TOO YOUNG TO READ Performed at [facility], [address], [place], [place] [address]    Report Status PENDING  Incomplete     Radiology Studies: No results found.  Scheduled Meds:  apixaban  5 mg Oral BID   arformoterol  15 mcg Nebulization Q12H   atorvastatin  40 mg Oral Daily   budesonide  0.25 mg Nebulization BID   cholecalciferol  2,000 Units Oral Daily   diltiazem  240 mg Oral Daily   dofetilide  250 mcg Oral BID   DULoxetine  30 mg Oral Daily   leflunomide  20 mg Oral Daily   levothyroxine  125 mcg Oral Q0600   metoprolol tartrate  12.5 mg Oral BID   montelukast  10 mg Oral Daily   pantoprazole  40 mg Oral QAC breakfast   pregabalin  50 mg Oral QHS   sodium chloride flush  3 mL Intravenous Q12H   torsemide  20 mg Oral BID   umeclidinium bromide  1 puff Inhalation Daily   Continuous Infusions:  sodium chloride     ceFEPime (MAXIPIME) IV 2 g (10/11/21 1034)   magnesium sulfate bolus IVPB       LOS: 5 days    Time spent: 25 mins    Jennessy Sandridge, MD [facility]   If 7PM-7AM, please contact night-coverage

## 2021-10-11 NOTE — Progress Notes (Signed)
Pharmacy Antibiotic Note  [name] is a 75 y.o. female admitted on 10/06/2021 with periprosthetic left proximal femur fx and fluid collections; s/p Lt THA on 09/16/21.  Ancef initially ordered for cellulitis. Pharmacy has been consulted for cefepime dosing.  Now s/p I&D with wound vac placement 12/8.  Plan: Continue Cefepime 2 g IV every 12 hours Monitor clinical progress, renal function F/U any C&S, abx deescalation / LOT   Height: 5' (152.4 cm) Weight: 92.3 kg (203 lb 8 oz) IBW/kg (Calculated) : 45.5  Temp (24hrs), Avg:98.5 F (36.9 C), Min:97.8 F (36.6 C), Max:99.4 F (37.4 C)  Recent Labs  Lab 10/07/21 0010 10/08/21 0413 10/09/21 0406 10/10/21 0413 10/11/21 0435  WBC 15.1* 10.5 8.2 9.3 8.3  CREATININE 1.56* 1.50* 1.09* 1.09* 1.37*     Estimated Creatinine Clearance: 36.5 mL/min (A) (by C-G formula based on SCr of 1.37 mg/dL (H)).    Allergies  Allergen Reactions   Gabapentin Other (See Comments)    Dizziness, lighthead    Antimicrobials this admission: 12/5 ancef >> 12/6 12/6 cefepime >>   Microbiology results: 11/10 SARS Coronavirus 2: negative  Thank you for allowing pharmacy to be a part of this patient's care.  Loralee Pacas, PharmD, BCPS Please utilize Amion for appropriate phone number to reach the unit pharmacist [facility]) 10/11/2021 8:07 AM

## 2021-10-11 NOTE — Progress Notes (Signed)
Subjective: 1 Day Post-Op Procedure(s) (LRB): IRRIGATION AND DEBRIDEMENT HIP WITH WOUND VAC (Left) Patient reports pain as mild.   Patient seen in rounds by Dr. Wynelle Link. Patient is well, and has had no acute complaints or problems. Denies SOB, chest pain, or calf pain. No acute overnight events.   Objective: Vital signs in last 24 hours: Temp:  [97.8 F (36.6 C)-99.4 F (37.4 C)] 99.4 F (37.4 C) (12/09 0525) Pulse Rate:  [69-89] 69 (12/09 0525) Resp:  [16-21] 16 (12/09 0525) BP: (96-151)/(47-82) 144/47 (12/09 0525) SpO2:  [95 %-100 %] 99 % (12/09 0525) Weight:  [91.1 kg-92.3 kg] 92.3 kg (12/09 0500)  Intake/Output from previous day:  Intake/Output Summary (Last 24 hours) at 10/11/2021 0756 Last data filed at 10/11/2021 0400 Gross per 24 hour  Intake 643 ml  Output 225 ml  Net 418 ml     Intake/Output this shift: No intake/output data recorded.  Labs: Recent Labs    10/09/21 0406 10/10/21 0413 10/11/21 0435  HGB 8.3* 8.8* 8.1*   Recent Labs    10/10/21 0413 10/11/21 0435  WBC 9.3 8.3  RBC 3.01* 2.80*  HCT 29.9* 27.9*  PLT 292 293   Recent Labs    10/10/21 0413 10/11/21 0435  NA 143 144  K 3.3* 4.1  CL 93* 96*  CO2 41* 38*  BUN 28* 36*  CREATININE 1.09* 1.37*  GLUCOSE 113* 167*  CALCIUM 9.2 8.7*   No results for input(s): LABPT, INR in the last 72 hours.  Exam: General - Patient is Alert and Oriented Extremity - Neurologically intact Neurovascular intact Dorsiflexion/Plantar flexion intact Dressing - dressing C/D/I, hemovac and woundvac in place Motor Function - intact, moving foot and toes well on exam.   Past Medical History:  Diagnosis Date   Anemia    Arthritis    OA RIGHT KNEE WITH PAIN   Barrett esophagus    BiPAP (biphasic positive airway pressure)    Wears at night   Bradycardia 06/01/2015   CAD in native artery    a. NSTEMI 05/2015 s/p emergent CABG.   Chronic diastolic (congestive) heart failure (HCC)    Chronic kidney  disease, stage 3a (HCC)    Chronic respiratory failure (HCC)    Chronic respiratory failure with hypoxia and hypercapnia ([place]) 02/04/2010   Followed in Pulmonary clinic/ [facility]       - 02 dependent  since 07/02/10 >>  83% RA December 05, 2010       - ONO RA 08/05/12  :  Positive sat < 89 x 2:21m> repeat on 2lpm rec 08/12/2012  - 06/17/2013 reported desat with activity p Knee surgery > rec restart 2lpm with activity  - 06/27/2013   Walked 2lpm  x one lap @ 185 stopped due to sat 88% not sob , desat to 82% on RA just at th   COPD III spirometry if use FEV1/VC p saba  07/18/2010   Quit smoking May 2006       - PFT's  04/12/10 FEV1  1.21 (69%) ratio 77 and no change p B2,  DLC0 56%   VC 70%         - PFTs  08/08/2013 FEV1 1.21 (60%) ratio 86 and no change p B2 DLCO 79%  VC 72%  On symbicort 160 2bid  - PFT's  02/08/2018  FEV1 0.70 (40 % ) ratio 56 if use FEV1/VC  p 38 % improvement from saba p symb 160 prior to study with  DLCO  78 % corrects to 147  % for alv volume   - 02/08/2018   Cough variant asthma 02/26/2011   Followed in Pulmonary clinic/ [facility]  - PFT's  06/04/15  FEV1 1.20 (67 % ) ratio 83  p 6 % improvement from saba with DLCO  80 % corrects to 132 % for alv volume      - Clinical dx based on response to symbicort       FENO 09/16/2016  =   96 on symbicort 160 2bid > added singulair  Allergy profile 09/16/2016 >  Eos 0.5 /  IgE  78 neg RAST  -  Referred to rehab 04/29/2017 > completed   Depression    Dysrhythmia    Afib   Essential hypertension 04/20/2007   Qualifier: Diagnosis of  By: Marcelyn Ditty, RN, Katy Fitch    GERD (gastroesophageal reflux disease)    History of ARDS 2006   History of home oxygen therapy    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 small intracranial hemorrhage in setting of HTN.   Mild carotid artery disease (HCC)    a. Duplex 1-39% bilaterally in 2016.   Morbid  (severe) obesity due to excess calories (HCC) 04/22/2015   pfts with erv 14% 06/04/15  And 33% 02/08/2018    NSTEMI (non-ST elevated myocardial infarction) (HCC) 05/31/2015   Pneumococcal pneumonia (HCC) 2006   HOSPITALIZED AND DEVELOPED ARDS   Psoriasis    Psoriatic arthritis (HCC)    PULMONARY FIBROSIS ILD POST INFLAMMATORY CHRONIC 07/18/2010   Followed as Primary Care Patient/ [facility]/ [facility]  -s/p ARDS 2006 with bacteremic S  Pna       - CT chest 07/03/10 Nonspecific PF mostly upper lobes       - CT chest 12/03/10 acute gg changes and effusions c/w chf - PFT's  02/08/2018  FVC 0.64 (28 %)   with DLCO  78 % corrects to 147 % for alv volume      Rhematoid arthritis    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   SOB (shortness of breath) on exertion     Assessment/Plan: 1 Day Post-Op Procedure(s) (LRB): IRRIGATION AND DEBRIDEMENT HIP WITH WOUND VAC (Left) Principal Problem:   Hematoma of left hip Active Problems:   COPD III spirometry if use FEV1/VC p saba    Chronic respiratory failure with hypoxia and hypercapnia (HCC)   Atrial fibrillation (HCC)   Chronic heart failure with preserved ejection fraction (HCC)   Stage 3b chronic kidney disease (HCC)   CAD in native artery   ILD (interstitial lung disease) (HCC)   Periprosthetic fracture of femur following total replacement of hip   Fluid collection at surgical site   Periprosthetic fracture around internal prosthetic left hip joint (HCC)  Estimated body mass index is 39.74 kg/m as calculated from the following:   Height as of this encounter: 5' (1.524 m).   Weight as of this encounter: 92.3 kg. Advance diet Up with therapy  DVT Prophylaxis - Lovenox and TED hose Weight bearing as tolerated.  Gram stain revealed gram positive cocci. Cultures pending.  Hemovac to be pulled tomorrow am.   Plan for likely discharge on Monday.  Plan is to go Home after hospital stay.   Nelia Shi, MBA, PA-C Orthopedic Surgery [phone] 10/11/2021, 7:56 AM

## 2021-10-11 NOTE — Plan of Care (Signed)

## 2021-10-12 DIAGNOSIS — S7002XD Contusion of left hip, subsequent encounter: Secondary | ICD-10-CM | POA: Diagnosis not present

## 2021-10-12 LAB — CBC
HCT: 28 % — ABNORMAL LOW (ref 36.0–46.0)
Hemoglobin: 8.2 g/dL — ABNORMAL LOW (ref 12.0–15.0)
MCH: 28.7 pg (ref 26.0–34.0)
MCHC: 29.3 g/dL — ABNORMAL LOW (ref 30.0–36.0)
MCV: 97.9 fL (ref 80.0–100.0)
Platelets: 276 10*3/uL (ref 150–400)
RBC: 2.86 MIL/uL — ABNORMAL LOW (ref 3.87–5.11)
RDW: 15.8 % — ABNORMAL HIGH (ref 11.5–15.5)
WBC: 8.8 10*3/uL (ref 4.0–10.5)
nRBC: 0 % (ref 0.0–0.2)

## 2021-10-12 LAB — MAGNESIUM: Magnesium: 1.6 mg/dL — ABNORMAL LOW (ref 1.7–2.4)

## 2021-10-12 LAB — BASIC METABOLIC PANEL
Anion gap: 9 (ref 5–15)
BUN: 38 mg/dL — ABNORMAL HIGH (ref 8–23)
CO2: 42 mmol/L — ABNORMAL HIGH (ref 22–32)
Calcium: 8.8 mg/dL — ABNORMAL LOW (ref 8.9–10.3)
Chloride: 92 mmol/L — ABNORMAL LOW (ref 98–111)
Creatinine, Ser: 1.35 mg/dL — ABNORMAL HIGH (ref 0.44–1.00)
GFR, Estimated: 41 mL/min — ABNORMAL LOW (ref >60)
Glucose, Bld: 117 mg/dL — ABNORMAL HIGH (ref 70–99)
Potassium: 3.6 mmol/L (ref 3.5–5.1)
Sodium: 143 mmol/L (ref 135–145)

## 2021-10-12 LAB — PHOSPHORUS: Phosphorus: 3 mg/dL (ref 2.5–4.6)

## 2021-10-12 LAB — RESP PANEL BY RT-PCR (FLU A&B, COVID) ARPGX2
Influenza A by PCR: NEGATIVE
Influenza B by PCR: NEGATIVE
SARS Coronavirus 2 by RT PCR: NEGATIVE

## 2021-10-12 MED ORDER — POTASSIUM CHLORIDE CRYS ER 20 MEQ PO TBCR
40.0000 meq | EXTENDED_RELEASE_TABLET | Freq: Once | ORAL | Status: AC
  Administered 2021-10-12: 40 meq via ORAL
  Filled 2021-10-12: qty 2

## 2021-10-12 MED ORDER — MAGNESIUM SULFATE 2 GM/50ML IV SOLN
2.0000 g | Freq: Once | INTRAVENOUS | Status: AC
  Administered 2021-10-12: 2 g via INTRAVENOUS
  Filled 2021-10-12: qty 50

## 2021-10-12 NOTE — Progress Notes (Signed)
   Subjective: 2 Days Post-Op Procedure(s) (LRB): IRRIGATION AND DEBRIDEMENT HIP WITH WOUND VAC (Left)  Pt c/o minimal to no pain to the left hip today Daughter concerned about d/c due to illness herself Denies any numbness or tingling distally Patient reports pain as mild.  Objective:   VITALS:   Vitals:   10/12/21 1028 10/12/21 1029  BP: (!) 148/78   Pulse:  75  Resp:    Temp:    SpO2:      Left hip wound vac in place Hemovac removed Nv intact distally No rashes or edema distally   LABS Recent Labs    10/10/21 0413 10/11/21 0435 10/12/21 0430  HGB 8.8* 8.1* 8.2*  HCT 29.9* 27.9* 28.0*  WBC 9.3 8.3 8.8  PLT 292 293 276    Recent Labs    10/10/21 0413 10/11/21 0435 10/12/21 0430  NA 143 144 143  K 3.3* 4.1 3.6  BUN 28* 36* 38*  CREATININE 1.09* 1.37* 1.35*  GLUCOSE 113* 167* 117*     Assessment/Plan: 2 Days Post-Op Procedure(s) (LRB): IRRIGATION AND DEBRIDEMENT HIP WITH WOUND VAC (Left) Continue PT/OT as able Will monitor her progress    Alphonsa Overall PA-C, MPAS [facility] is now [facility] [employer] [facility] [address], [place], [place] [address] Phone: [phone] [email or web address] Facebook  [employer]

## 2021-10-12 NOTE — Progress Notes (Addendum)
PROGRESS NOTE    [name]  QAS:[id number] DOB: July 30, 1946 DOA: 10/06/2021 PCP: [name], [name], MD    Brief Narrative:  This 75 y.o. female with PMH significant for CAD, HFpEF, atrial fibrillation on Eliquis, CKD IIIb, ILD, COPD, OSA, chronic hypoxic and hypercarbic respiratory failure, RA, and osteoarthritis status post left total hip arthroplasty on 09/16/2021 who presented to [facility] on 10/05/2021 with increasing left hip pain.  CT of the left hip concerning for periprosthetic intertrochanteric left femur fracture and periprosthetic fluid collections.  CT chest negative for PE.  She was seen by Dr. Lequita Halt and is recommending nonsurgical management due to multiple comorbidities.  Patient is admitted for Periprosthetic hip fracture with fluid collection.  She underwent irrigation and debridement of left hip with wound VAC on 12/8.  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 She now complains of increasing left hip pain and drainage. CT scan done in an outside hospital concerning for periprosthetic fracture and fluid collections.  Patient seen by Ortho and recommended nonoperative treatment with TTWB initially. Continue IV cefepime.  Given increased drainage and fluid pocket seen on CT scan,  Ortho recommended it is best to  proceed with an irrigation and debridement of the wound on 10/10/21.   She underwent irrigation and debridement of left hip with wound VAC on 12/8. Continue current management , plan for likely discharge on Monday.  Chronic hypoxic and hypercapnic respiratory failure sec. to COPD, OSA, ILD: Chest x-ray on 10/07/2021 showed cardiomegaly with interstitial edema and pleural effusion.   Continue torsemide 20 mg twice a day. Continue nebulizer,  supplemental oxygen and BiPAP nightly.   Chronic atrial fibrillation: Heart rate controlled.  Continue Cardizem and Tikosyn. Eliquis is on hold as she was going to have debridement. Eliquis resumed.  Chronic diastolic heart failure: Last LVEF 65 to 70% in 10/22 Continue torsemide.  AKI on CKD stage IIIb: Monitor renal functions on torsemide. Serum creatinine slightly up to 1.37  Rheumatoid arthritis: Continue leflunomide  Anemia of chronic disease: Hemoglobin currently stable,  Continue to monitor H&H.  History of CAD and CABG: Continue Lipitor      DVT prophylaxis:Eliquis Code Status: DNR Family Communication: No family at bedside Disposition Plan:   Status is: Inpatient  Remains inpatient appropriate because: Possible fluid collection , underwent debridement and incision and drainage.  Potential discharge home on Monday 12/12.  Consultants:  Orthopedics  Procedures: Debridement and incision and drainage Antimicrobials:  Anti-infectives (From admission, onward)    Start     Dose/Rate Route Frequency Ordered Stop   10/09/21 1300  ceFAZolin (ANCEF) IVPB 2g/100 mL premix  Status:  Discontinued        2 g 200 mL/hr over 30 Minutes Intravenous On call to O.R. 10/09/21 1239 10/09/21 1322   10/08/21 1330  ceFEPIme (MAXIPIME) 2 g in  sodium chloride 0.9 % 100 mL IVPB        2 g 200 mL/hr over 30 Minutes Intravenous Every 12 hours 10/08/21 1237     10/07/21 1700  ceFAZolin (ANCEF) IVPB 2g/100 mL premix  Status:  Discontinued         2 g 200 mL/hr over 30 Minutes Intravenous Every 8 hours 10/07/21 1614 10/08/21 1237        Subjective: Patient was seen and examined at bedside.  Overnight events noted. Patient is s/p debridement and irrigation with wound VAC. Patient reports pain is manageable.  She is feeling better and anticipating discharge on Monday. She reports her daughter is positive for influenza.  She does not have any symptoms.   Objective: Vitals:   10/12/21 0935 10/12/21 0938 10/12/21 1028 10/12/21 1029  BP:   (!) 148/78   Pulse:    75  Resp:      Temp:      TempSrc:      SpO2: 98% 98%    Weight:      Height:        Intake/Output Summary (Last 24 hours) at 10/12/2021 1259 Last data filed at 10/12/2021 1221 Gross per 24 hour  Intake 563 ml  Output 2300 ml  Net -1737 ml   Filed Weights   10/10/21 1658 10/11/21 0500 10/12/21 0500  Weight: 91.1 kg 92.3 kg 93.3 kg    Examination:  General exam: Appears comfortable, deconditioned, morbidly obese, not in any distress Respiratory system: Clear to auscultation.  Respiratory effort normal.  RR 16 Cardiovascular system: S1-S2 heard, regular rate and rhythm, no murmur.   Gastrointestinal system: Abdomen is soft, non tender, nondistended, BS+ Central nervous system: Alert and oriented x3. No focal neurological deficits. Extremities: Left hip tenderness+, wound VAC noted with good suction. Skin: No rashes, lesions or ulcers Psychiatry: Judgement and insight appear normal. Mood & affect appropriate.     Data Reviewed: I have personally reviewed following labs and imaging studies  CBC: Recent Labs  Lab 10/08/21 0413 10/09/21 0406 10/10/21 0413 10/11/21 0435 10/12/21 0430  WBC 10.5 8.2 9.3 8.3 8.8  HGB 7.8* 8.3* 8.8* 8.1* 8.2*  HCT 26.7* 28.6* 29.9* 27.9* 28.0*  MCV 99.3 99.0 99.3 99.6 97.9  PLT 276 272 292 293 [id number]   Basic Metabolic Panel: Recent Labs  Lab 10/07/21 0010 10/08/21 0413 10/09/21 0406 10/10/21 0413 10/11/21 0435  10/12/21 0430  NA 141 144 144 143 144 143  K 3.6 4.1 3.5 3.3* 4.1 3.6  CL 97* 100 96* 93* 96* 92*  CO2 36* 36* 40* 41* 38* 42*  GLUCOSE 168* 116* 125* 113* 167* 117*  BUN 35* 35* 27* 28* 36* 38*  CREATININE 1.56* 1.50* 1.09* 1.09* 1.37* 1.35*  CALCIUM 8.7* 8.8* 9.1 9.2 8.7* 8.8*  MG 2.0  --   --  1.7 1.8 1.6*  PHOS  --   --   --  2.5  --  3.0   GFR: Estimated Creatinine Clearance: 37.3 mL/min (A) (by C-G formula based on SCr of 1.35 mg/dL (H)). Liver Function Tests: Recent Labs  Lab 10/07/21 0010  AST 16  ALT 12  ALKPHOS 74  BILITOT 0.5  PROT 6.5  ALBUMIN 2.7*   No results for input(s): LIPASE, AMYLASE in the last 168 hours. No results for input(s): AMMONIA in the last 168 hours. Coagulation Profile: No results for input(s): INR, PROTIME in the last 168 hours. Cardiac Enzymes: No results for input(s): CKTOTAL, CKMB, CKMBINDEX, TROPONINI in  the last 168 hours. BNP (last 3 results) Recent Labs    06/13/21 1303  PROBNP 588*   HbA1C: No results for input(s): HGBA1C in the last 72 hours. CBG: No results for input(s): GLUCAP in the last 168 hours. Lipid Profile: No results for input(s): CHOL, HDL, LDLCALC, TRIG, CHOLHDL, LDLDIRECT in the last 72 hours. Thyroid Function Tests: No results for input(s): TSH, T4TOTAL, FREET4, T3FREE, THYROIDAB in the last 72 hours. Anemia Panel: No results for input(s): VITAMINB12, FOLATE, FERRITIN, TIBC, IRON, RETICCTPCT in the last 72 hours. Sepsis Labs: Recent Labs  Lab 10/07/21 0010 10/08/21 0413  PROCALCITON 0.19 <0.10    Recent Results (from the past 240 hour(s))  Aerobic/Anaerobic Culture w Gram Stain (surgical/deep wound)     Status: None (Preliminary result)   Collection Time: 10/10/21  6:31 PM   Specimen: Synovial, Left Hip; Body Fluid  Result Value Ref Range Status   Specimen Description   Final    SYNOVIAL LEFT HIP Performed at [facility] [address] [place], [place] [address]    Special  Requests   Final    NONE Performed at [facility], [place] [address], [place], [place] [address]    Gram Stain   Final    MODERATE WBC PRESENT,BOTH PMN AND MONONUCLEAR MODERATE GRAM POSITIVE COCCI    Culture   Final    TOO YOUNG TO READ Performed at [facility], [place] [address] [place], [place] [address]    Report Status PENDING  Incomplete     Radiology Studies: No results found.  Scheduled Meds:  apixaban  5 mg Oral BID   arformoterol  15 mcg Nebulization Q12H   atorvastatin  40 mg Oral Daily   budesonide  0.25 mg Nebulization BID   cholecalciferol  2,000 Units Oral Daily   diltiazem  240 mg Oral Daily   dofetilide  250 mcg Oral BID   DULoxetine  30 mg Oral Daily   leflunomide  20 mg Oral Daily   levothyroxine  125 mcg Oral Q0600   metoprolol tartrate  12.5 mg Oral BID   montelukast  10 mg Oral Daily   pantoprazole  40 mg Oral QAC breakfast   pregabalin  50 mg Oral QHS   sodium chloride flush  3 mL Intravenous Q12H   torsemide  20 mg Oral BID   umeclidinium bromide  1 puff Inhalation Daily   Continuous Infusions:  sodium chloride     ceFEPime (MAXIPIME) IV 2 g (10/12/21 1213)     LOS: 6 days    Time spent: 25 mins    Dequon Schnebly, MD [facility]   If 7PM-7AM, please contact night-coverage

## 2021-10-12 NOTE — Plan of Care (Signed)

## 2021-10-13 DIAGNOSIS — S7002XD Contusion of left hip, subsequent encounter: Secondary | ICD-10-CM | POA: Diagnosis not present

## 2021-10-13 LAB — BASIC METABOLIC PANEL
Anion gap: 8 (ref 5–15)
BUN: 43 mg/dL — ABNORMAL HIGH (ref 8–23)
CO2: 41 mmol/L — ABNORMAL HIGH (ref 22–32)
Calcium: 8.7 mg/dL — ABNORMAL LOW (ref 8.9–10.3)
Chloride: 93 mmol/L — ABNORMAL LOW (ref 98–111)
Creatinine, Ser: 1.41 mg/dL — ABNORMAL HIGH (ref 0.44–1.00)
GFR, Estimated: 39 mL/min — ABNORMAL LOW (ref >60)
Glucose, Bld: 114 mg/dL — ABNORMAL HIGH (ref 70–99)
Potassium: 3.9 mmol/L (ref 3.5–5.1)
Sodium: 142 mmol/L (ref 135–145)

## 2021-10-13 LAB — PHOSPHORUS: Phosphorus: 3.4 mg/dL (ref 2.5–4.6)

## 2021-10-13 LAB — MAGNESIUM: Magnesium: 1.8 mg/dL (ref 1.7–2.4)

## 2021-10-13 MED ORDER — MAGNESIUM SULFATE 4 GM/100ML IV SOLN
4.0000 g | Freq: Once | INTRAVENOUS | Status: AC
  Administered 2021-10-13: 4 g via INTRAVENOUS
  Filled 2021-10-13: qty 100

## 2021-10-13 MED ORDER — VANCOMYCIN HCL 1250 MG/250ML IV SOLN
1250.0000 mg | INTRAVENOUS | Status: DC
  Administered 2021-10-13 – 2021-10-15 (×2): 1250 mg via INTRAVENOUS
  Filled 2021-10-13 (×2): qty 250

## 2021-10-13 NOTE — Progress Notes (Signed)
PROGRESS NOTE    [name]  [id number] DOB: 19-Dec-1945 DOA: 10/06/2021 PCP: Isaac Bliss, Rayford Halsted, MD    Brief Narrative:  This 75 y.o. female with PMH significant for CAD, HFpEF, atrial fibrillation on Eliquis, CKD IIIb, ILD, COPD, OSA, chronic hypoxic and hypercarbic respiratory failure, RA, and osteoarthritis status post left total hip arthroplasty on 09/16/2021 who presented to [facility] on 10/05/2021 with increasing left hip pain.  CT of the left hip concerning for periprosthetic intertrochanteric left femur fracture and periprosthetic fluid collections.  CT chest negative for PE.  She was seen by Dr. Wynelle Link and is recommending nonsurgical management due to multiple comorbidities.  Patient is admitted for Periprosthetic hip fracture with fluid collection.  She underwent irrigation and debridement of left hip with wound VAC on 12/8.  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 She now complains of increasing left hip pain and drainage. CT scan done in an outside hospital concerning for periprosthetic fracture and fluid collections.  Patient seen by Ortho and recommended nonoperative treatment with TTWB initially. Continue IV cefepime.  Given increased drainage and fluid pocket seen on CT scan,  Ortho recommended it is best to  proceed with an irrigation and debridement of the wound on 10/10/21.   She underwent irrigation and debridement of left hip with wound VAC on 12/8. Continue current management, intra operative cultures growing staph aureus, sensitivities pending. Ortho advised to add vancomycin to cefepime.  Patient might need PICC line.  Chronic hypoxic and hypercapnic respiratory failure sec. to COPD, OSA, ILD: Chest x-ray on 10/07/2021 showed cardiomegaly with interstitial edema and pleural effusion.   Continue torsemide 20 mg twice a day. Continue nebulizer,  supplemental oxygen and BiPAP nightly.   Chronic atrial fibrillation: Heart rate controlled.  Continue Cardizem and Tikosyn. Continue Eliquis.  Chronic diastolic heart failure: Last LVEF 65 to 70% in 10/22 Continue torsemide.  AKI on CKD stage IIIb: Monitor renal functions on torsemide. Serum creatinine slightly up to 1.37  Rheumatoid arthritis: Continue leflunomide.  Anemia of chronic disease: Hemoglobin currently stable,  Continue to monitor H&H.  History of CAD and CABG: Continue Lipitor      DVT prophylaxis:Eliquis Code Status: DNR Family Communication: Daughter is at bedside. Disposition Plan:   Status is: Inpatient  Remains inpatient appropriate because: Possible fluid collection , underwent debridement and incision and drainage.  Intraoperative cultures growing staph aureus , sensitivities pending.  Vancomycin added to cefepime. Patient may require PICC line for long-term antibiotics.  Consultants:  Orthopedics  Procedures: Debridement and incision and drainage Antimicrobials:  Anti-infectives (From admission, onward)    Start     Dose/Rate Route Frequency Ordered Stop   10/13/21 1000  vancomycin (VANCOREADY) IVPB 1250 mg/250 mL        1,250 mg 166.7 mL/hr over 90 Minutes Intravenous Every 48 hours 10/13/21  8416     10/09/21 1300  ceFAZolin (ANCEF) IVPB 2g/100 mL premix  Status:  Discontinued        2 g 200  mL/hr over 30 Minutes Intravenous On call to O.R. 10/09/21 1239 10/09/21 1322   10/08/21 1330  ceFEPIme (MAXIPIME) 2 g in sodium chloride 0.9 % 100 mL IVPB        2 g 200 mL/hr over 30 Minutes Intravenous Every 12 hours 10/08/21 1237     10/07/21 1700  ceFAZolin (ANCEF) IVPB 2g/100 mL premix  Status:  Discontinued        2 g 200 mL/hr over 30 Minutes Intravenous Every 8 hours 10/07/21 1614 10/08/21 1237        Subjective: Patient was seen and examined at bedside.  Overnight events noted. Patient is s/p debridement and irrigation with wound VAC. Patient reports pain is manageable.  Patient reports her daughter is COVID+, She wants to stay until Wednesday,  states does not want to get sick from daughter.   Objective: Vitals:   10/13/21 0512 10/13/21 0943 10/13/21 0945 10/13/21 1223  BP: (!) 143/64   121/70  Pulse: 71   88  Resp: 18   (!) 21  Temp: 97.8 F (36.6 C)   98.4 F (36.9 C)  TempSrc: Oral   Oral  SpO2: 98% 96% 96% 97%  Weight:      Height:        Intake/Output Summary (Last 24 hours) at 10/13/2021 1433 Last data filed at 10/13/2021 1100 Gross per 24 hour  Intake 343 ml  Output 1400 ml  Net -1057 ml   Filed Weights   10/11/21 0500 10/12/21 0500 10/13/21 0500  Weight: 92.3 kg 93.3 kg 92.3 kg    Examination:  General exam: Appears comfortable, not in any acute distress, morbidly obese, deconditioned Respiratory system: Clear to auscultation bilaterally.  Respiratory effort normal.  RR 13 Cardiovascular system: S1-S2 heard, Irregular rate and rhythm, no murmur.   Gastrointestinal system: Abdomen is soft, non tender, nondistended, BS+ Central nervous system: Alert and oriented x3. No focal neurological deficits. Extremities: Left hip tenderness+, wound VAC noted with good suction. Skin: No rashes, lesions or ulcers Psychiatry: Judgement and insight appear normal. Mood & affect appropriate.     Data Reviewed: I have personally reviewed following labs and  imaging studies  CBC: Recent Labs  Lab 10/08/21 0413 10/09/21 0406 10/10/21 0413 10/11/21 0435 10/12/21 0430  WBC 10.5 8.2 9.3 8.3 8.8  HGB 7.8* 8.3* 8.8* 8.1* 8.2*  HCT 26.7* 28.6* 29.9* 27.9* 28.0*  MCV 99.3 99.0 99.3 99.6 97.9  PLT 276 272 292 293 276   Basic Metabolic Panel: Recent Labs  Lab 10/07/21 0010 10/08/21 0413 10/09/21 0406 10/10/21 0413 10/11/21 0435 10/12/21 0430 10/13/21 0414  NA 141   < > 144 143 144 143 142  K 3.6   < > 3.5 3.3* 4.1 3.6 3.9  CL 97*   < > 96* 93* 96* 92* 93*  CO2 36*   < > 40* 41* 38* 42* 41*  GLUCOSE 168*   < > 125* 113* 167* 117* 114*  BUN 35*   < > 27* 28* 36* 38* 43*  CREATININE 1.56*   < > 1.09* 1.09* 1.37* 1.35* 1.41*  CALCIUM 8.7*   < > 9.1 9.2 8.7* 8.8* 8.7*  MG 2.0  --   --  1.7 1.8 1.6* 1.8  PHOS  --   --   --  2.5  --  3.0  3.4   < > = values in this interval not displayed.   GFR: Estimated Creatinine Clearance: 35.5 mL/min (A) (by C-G formula based on SCr of 1.41 mg/dL (H)). Liver Function Tests: Recent Labs  Lab 10/07/21 0010  AST 16  ALT 12  ALKPHOS 74  BILITOT 0.5  PROT 6.5  ALBUMIN 2.7*   No results for input(s): LIPASE, AMYLASE in the last 168 hours. No results for input(s): AMMONIA in the last 168 hours. Coagulation Profile: No results for input(s): INR, PROTIME in the last 168 hours. Cardiac Enzymes: No results for input(s): CKTOTAL, CKMB, CKMBINDEX, TROPONINI in the last 168 hours. BNP (last 3 results) Recent Labs    06/13/21 1303  PROBNP 588*   HbA1C: No results for input(s): HGBA1C in the last 72 hours. CBG: No results for input(s): GLUCAP in the last 168 hours. Lipid Profile: No results for input(s): CHOL, HDL, LDLCALC, TRIG, CHOLHDL, LDLDIRECT in the last 72 hours. Thyroid Function Tests: No results for input(s): TSH, T4TOTAL, FREET4, T3FREE, THYROIDAB in the last 72 hours. Anemia Panel: No results for input(s): VITAMINB12, FOLATE, FERRITIN, TIBC, IRON, RETICCTPCT in the last 72  hours. Sepsis Labs: Recent Labs  Lab 10/07/21 0010 10/08/21 0413  PROCALCITON 0.19 <0.10    Recent Results (from the past 240 hour(s))  Aerobic/Anaerobic Culture w Gram Stain (surgical/deep wound)     Status: None (Preliminary result)   Collection Time: 10/10/21  6:31 PM   Specimen: Synovial, Left Hip; Body Fluid  Result Value Ref Range Status   Specimen Description   Final    SYNOVIAL LEFT HIP Performed at [facility] [address], [place], [place] [address]    Special Requests   Final    NONE Performed at [facility], [place] [address] [place], [place] [address]    Gram Stain   Final    MODERATE WBC PRESENT,BOTH PMN AND MONONUCLEAR MODERATE GRAM POSITIVE COCCI Performed at [facility], [place] [address] [place], [place] [address]    Culture   Final    RARE STAPHYLOCOCCUS AUREUS SUSCEPTIBILITIES TO FOLLOW NO ANAEROBES ISOLATED; CULTURE IN PROGRESS FOR 5 DAYS    Report Status PENDING  Incomplete  Resp Panel by RT-PCR (Flu A&B, Covid) Nasopharyngeal Swab     Status: None   Collection Time: 10/12/21  1:40 PM   Specimen: Nasopharyngeal Swab; Nasopharyngeal(NP) swabs in vial transport medium  Result Value Ref Range Status   SARS Coronavirus 2 by RT PCR NEGATIVE NEGATIVE Final    Comment: (NOTE) SARS-CoV-2 target nucleic acids are NOT DETECTED.  The SARS-CoV-2 RNA is generally detectable in upper respiratory specimens during the acute phase of infection. The lowest concentration of SARS-CoV-2 viral copies this assay can detect is 138 copies/mL. A negative result does not preclude SARS-Cov-2 infection and should not be used as the sole basis for treatment or other patient management decisions. A negative result may occur with  improper specimen collection/handling, submission of specimen other than nasopharyngeal swab, presence of viral mutation(s) within the areas targeted by this assay, and inadequate number of  viral copies(<138 copies/mL). A negative result must be combined with clinical observations, patient history, and epidemiological information. The expected result is Negative.  Fact Sheet for Patients:  EntrepreneurPulse.com.au  Fact Sheet for Healthcare Providers:  IncredibleEmployment.be  This test is no t yet approved or cleared by the [place] FDA and  has been authorized for detection and/or diagnosis of SARS-CoV-2 by FDA under an Emergency Use  Authorization (EUA). This EUA will remain  in effect (meaning this test can be used) for the duration of the COVID-19 declaration under Section 564(b)(1) of the Act, 21 U.S.C.section 360bbb-3(b)(1), unless the authorization is terminated  or revoked sooner.       Influenza A by PCR NEGATIVE NEGATIVE Final   Influenza B by PCR NEGATIVE NEGATIVE Final    Comment: (NOTE) The Xpert Xpress SARS-CoV-2/FLU/RSV plus assay is intended as an aid in the diagnosis of influenza from Nasopharyngeal swab specimens and should not be used as a sole basis for treatment. Nasal washings and aspirates are unacceptable for Xpert Xpress SARS-CoV-2/FLU/RSV testing.  Fact Sheet for Patients: EntrepreneurPulse.com.au  Fact Sheet for Healthcare Providers: IncredibleEmployment.be  This test is not yet approved or cleared by the [place] FDA and has been authorized for detection and/or diagnosis of SARS-CoV-2 by FDA under an Emergency Use Authorization (EUA). This EUA will remain in effect (meaning this test can be used) for the duration of the COVID-19 declaration under Section 564(b)(1) of the Act, 21 U.S.C. section 360bbb-3(b)(1), unless the authorization is terminated or revoked.  Performed at [facility], [place] [address] [place], [place] [address]      Radiology Studies: No results found.  Scheduled Meds:  apixaban  5 mg Oral BID    arformoterol  15 mcg Nebulization Q12H   atorvastatin  40 mg Oral Daily   budesonide  0.25 mg Nebulization BID   cholecalciferol  2,000 Units Oral Daily   diltiazem  240 mg Oral Daily   dofetilide  250 mcg Oral BID   DULoxetine  30 mg Oral Daily   leflunomide  20 mg Oral Daily   levothyroxine  125 mcg Oral Q0600   metoprolol tartrate  12.5 mg Oral BID   montelukast  10 mg Oral Daily   pantoprazole  40 mg Oral QAC breakfast   pregabalin  50 mg Oral QHS   sodium chloride flush  3 mL Intravenous Q12H   torsemide  20 mg Oral BID   umeclidinium bromide  1 puff Inhalation Daily   Continuous Infusions:  sodium chloride     ceFEPime (MAXIPIME) IV 2 g (10/13/21 1012)   magnesium sulfate bolus IVPB 4 g (10/13/21 1401)   vancomycin 1,250 mg (10/13/21 1143)     LOS: 7 days    Time spent: 25 mins    Jil Penland, MD [facility]   If 7PM-7AM, please contact night-coverage

## 2021-10-13 NOTE — Progress Notes (Signed)
Pharmacy Antibiotic Note  [name] is a 75 y.o. female admitted on 10/06/2021 with periprosthetic left proximal femur fx and fluid collections; s/p Lt THA on 09/16/21.  Ancef initially ordered for cellulitis. Pharmacy has been consulted for cefepime dosing.  Now s/p I&D with wound vac placement 12/8.    Day #7 abx, Adding vancomycin today. Tmax 99 WBC WNL CKDIIIb, SCr rising 1.41  Plan: Continue Cefepime 2 g IV every 12 hours Begin Vancomycin 1250mg  IV q48h for estimated AUC 535 using SCr 1.41, Vd 0.5 Check vancomycin levels as needed, goal AUC 400-550 Monitor clinical progress, renal function F/U any C&S, abx deescalation / LOT   Height: 5' (152.4 cm) Weight: 92.3 kg (203 lb 6.4 oz) IBW/kg (Calculated) : 45.5  Temp (24hrs), Avg:98.4 F (36.9 C), Min:97.8 F (36.6 C), Max:99 F (37.2 C)  Recent Labs  Lab 10/08/21 0413 10/09/21 0406 10/10/21 0413 10/11/21 0435 10/12/21 0430 10/13/21 0414  WBC 10.5 8.2 9.3 8.3 8.8  --   CREATININE 1.50* 1.09* 1.09* 1.37* 1.35* 1.41*     Estimated Creatinine Clearance: 35.5 mL/min (A) (by C-G formula based on SCr of 1.41 mg/dL (H)).    Allergies  Allergen Reactions   Gabapentin Other (See Comments)    Dizziness, lighthead    Antimicrobials this admission: 12/5 ancef >> 12/6 12/6 cefepime >>  12/11 vancomycin >>  Microbiology results: 11/10 SARS Coronavirus 2: negative 12/8 L hip: rare Staph aureus, no anaerobes (prelim)  Thank you for allowing pharmacy to be a part of this patient's care.  14/8, PharmD, BCPS Please utilize Amion for appropriate phone number to reach the unit pharmacist [facility]) 10/13/2021 9:00 AM

## 2021-10-13 NOTE — Progress Notes (Signed)
Subjective:  Patient reports pain as mild to moderate.  Denies N/V/CP/SOB. No c/o  Objective:   VITALS:   Vitals:   10/12/21 2045 10/12/21 2135 10/13/21 0500 10/13/21 0512  BP: 113/69   (!) 143/64  Pulse: 79 80  71  Resp: 18 20  18   Temp: 99 F (37.2 C)   97.8 F (36.6 C)  TempSrc: Oral   Oral  SpO2: 97% 94%  98%  Weight:   92.3 kg   Height:        NAD ABD soft L hip iVAC intact w/o leak (+) peri-incisional erythema No fluid collection Thigh soft and compressible NVI distally  Lab Results  Component Value Date   WBC 8.8 10/12/2021   HGB 8.2 (L) 10/12/2021   HCT 28.0 (L) 10/12/2021   MCV 97.9 10/12/2021   PLT 276 10/12/2021   BMET    Component Value Date/Time   NA 142 10/13/2021 0414   NA 142 07/01/2021 1047   K 3.9 10/13/2021 0414   CL 93 (L) 10/13/2021 0414   CO2 41 (H) 10/13/2021 0414   GLUCOSE 114 (H) 10/13/2021 0414   BUN 43 (H) 10/13/2021 0414   BUN 33 (H) 07/01/2021 1047   CREATININE 1.41 (H) 10/13/2021 0414   CREATININE 0.87 09/29/2016 1335   CALCIUM 8.7 (L) 10/13/2021 0414   EGFR 35 (L) 07/01/2021 1047   GFRNONAA 39 (L) 10/13/2021 0414   Recent Results (from the past 240 hour(s))  Aerobic/Anaerobic Culture w Gram Stain (surgical/deep wound)     Status: None (Preliminary result)   Collection Time: 10/10/21  6:31 PM   Specimen: Synovial, Left Hip; Body Fluid  Result Value Ref Range Status   Specimen Description   Final    SYNOVIAL LEFT HIP Performed at [place] [address], [place], [place] [address]    Special Requests   Final    NONE Performed at [facility], [place] [address], [place], [place] [address]    Gram Stain   Final    MODERATE WBC PRESENT,BOTH PMN AND MONONUCLEAR MODERATE GRAM POSITIVE COCCI Performed at [facility], [place] [address] [place], [place] [address]    Culture   Final    RARE STAPHYLOCOCCUS AUREUS NO ANAEROBES ISOLATED; CULTURE IN PROGRESS FOR 5 DAYS     Report Status PENDING  Incomplete  Resp Panel by RT-PCR (Flu A&B, Covid) Nasopharyngeal Swab     Status: None   Collection Time: 10/12/21  1:40 PM   Specimen: Nasopharyngeal Swab; Nasopharyngeal(NP) swabs in vial transport medium  Result Value Ref Range Status   SARS Coronavirus 2 by RT PCR NEGATIVE NEGATIVE Final    Comment: (NOTE) SARS-CoV-2 target nucleic acids are NOT DETECTED.  The SARS-CoV-2 RNA is generally detectable in upper respiratory specimens during the acute phase of infection. The lowest concentration of SARS-CoV-2 viral copies this assay can detect is 138 copies/mL. A negative result does not preclude SARS-Cov-2 infection and should not be used as the sole basis for treatment or other patient management decisions. A negative result may occur with  improper specimen collection/handling, submission of specimen other than nasopharyngeal swab, presence of viral mutation(s) within the areas targeted by this assay, and inadequate number of viral copies(<138 copies/mL). A negative result must be combined with clinical observations, patient history, and epidemiological information. The expected result is Negative.  Fact Sheet for Patients:  EntrepreneurPulse.com.au  Fact Sheet for Healthcare Providers:  IncredibleEmployment.be  This test is no  t yet approved or cleared by the [place] and  has been authorized for detection and/or diagnosis of SARS-CoV-2 by FDA under an Emergency Use Authorization (EUA). This EUA will remain  in effect (meaning this test can be used) for the duration of the COVID-19 declaration under Section 564(b)(1) of the Act, 21 U.S.C.section 360bbb-3(b)(1), unless the authorization is terminated  or revoked sooner.       Influenza A by PCR NEGATIVE NEGATIVE Final   Influenza B by PCR NEGATIVE NEGATIVE Final    Comment: (NOTE) The Xpert Xpress SARS-CoV-2/FLU/RSV plus assay is intended as an aid in the  diagnosis of influenza from Nasopharyngeal swab specimens and should not be used as a sole basis for treatment. Nasal washings and aspirates are unacceptable for Xpert Xpress SARS-CoV-2/FLU/RSV testing.  Fact Sheet for Patients: EntrepreneurPulse.com.au  Fact Sheet for Healthcare Providers: IncredibleEmployment.be  This test is not yet approved or cleared by the [place] FDA and has been authorized for detection and/or diagnosis of SARS-CoV-2 by FDA under an Emergency Use Authorization (EUA). This EUA will remain in effect (meaning this test can be used) for the duration of the COVID-19 declaration under Section 564(b)(1) of the Act, 21 U.S.C. section 360bbb-3(b)(1), unless the authorization is terminated or revoked.  Performed at [facility], [place] [address] [place], [place] [address]      Assessment/Plan: 3 Days Post-Op   Principal Problem:   Hematoma of left hip Active Problems:   COPD III spirometry if use FEV1/VC p saba    Chronic respiratory failure with hypoxia and hypercapnia (HCC)   Atrial fibrillation (HCC)   Chronic heart failure with preserved ejection fraction (HCC)   Stage 3b chronic kidney disease (HCC)   CAD in native artery   ILD (interstitial lung disease) ([place])   Periprosthetic fracture of femur following total replacement of hip   Fluid collection at surgical site   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

## 2021-10-14 DIAGNOSIS — S7002XD Contusion of left hip, subsequent encounter: Secondary | ICD-10-CM | POA: Diagnosis not present

## 2021-10-14 LAB — BASIC METABOLIC PANEL
Anion gap: 9 (ref 5–15)
BUN: 39 mg/dL — ABNORMAL HIGH (ref 8–23)
CO2: 39 mmol/L — ABNORMAL HIGH (ref 22–32)
Calcium: 8.7 mg/dL — ABNORMAL LOW (ref 8.9–10.3)
Chloride: 93 mmol/L — ABNORMAL LOW (ref 98–111)
Creatinine, Ser: 1.37 mg/dL — ABNORMAL HIGH (ref 0.44–1.00)
GFR, Estimated: 41 mL/min — ABNORMAL LOW (ref >60)
Glucose, Bld: 109 mg/dL — ABNORMAL HIGH (ref 70–99)
Potassium: 3.4 mmol/L — ABNORMAL LOW (ref 3.5–5.1)
Sodium: 141 mmol/L (ref 135–145)

## 2021-10-14 LAB — CBC
HCT: 26.5 % — ABNORMAL LOW (ref 36.0–46.0)
Hemoglobin: 7.7 g/dL — ABNORMAL LOW (ref 12.0–15.0)
MCH: 28.8 pg (ref 26.0–34.0)
MCHC: 29.1 g/dL — ABNORMAL LOW (ref 30.0–36.0)
MCV: 99.3 fL (ref 80.0–100.0)
Platelets: 238 10*3/uL (ref 150–400)
RBC: 2.67 MIL/uL — ABNORMAL LOW (ref 3.87–5.11)
RDW: 16.2 % — ABNORMAL HIGH (ref 11.5–15.5)
WBC: 8.4 10*3/uL (ref 4.0–10.5)
nRBC: 0.2 % (ref 0.0–0.2)

## 2021-10-14 LAB — HEMOGLOBIN AND HEMATOCRIT, BLOOD
HCT: 27.8 % — ABNORMAL LOW (ref 36.0–46.0)
Hemoglobin: 8.2 g/dL — ABNORMAL LOW (ref 12.0–15.0)

## 2021-10-14 LAB — ACID FAST SMEAR (AFB, MYCOBACTERIA): Acid Fast Smear: NEGATIVE

## 2021-10-14 MED ORDER — POTASSIUM CHLORIDE CRYS ER 20 MEQ PO TBCR
40.0000 meq | EXTENDED_RELEASE_TABLET | Freq: Once | ORAL | Status: AC
  Administered 2021-10-14: 40 meq via ORAL
  Filled 2021-10-14: qty 2

## 2021-10-14 MED ORDER — POTASSIUM CHLORIDE 20 MEQ PO PACK
40.0000 meq | PACK | Freq: Once | ORAL | Status: AC
  Administered 2021-10-14: 40 meq via ORAL
  Filled 2021-10-14: qty 2

## 2021-10-14 NOTE — Progress Notes (Signed)
Subjective: 4 Days Post-Op Procedure(s) (LRB): IRRIGATION AND DEBRIDEMENT HIP WITH WOUND VAC (Left) Patient reports pain as mild.   Patient seen in rounds by Dr. Lequita Halt. Patient is well, and has had no acute complaints or problems.  Objective: Vital signs in last 24 hours: Temp:  [98.1 F (36.7 C)-98.6 F (37 C)] 98.6 F (37 C) (12/12 0556) Pulse Rate:  [66-88] 66 (12/12 0556) Resp:  [18-21] 20 (12/12 0556) BP: (116-130)/(56-70) 130/56 (12/12 0556) SpO2:  [97 %-99 %] 97 % (12/12 0630) Weight:  [92 kg] 92 kg (12/12 0500)  Intake/Output from previous day:  Intake/Output Summary (Last 24 hours) at 10/14/2021 1127 Last data filed at 10/14/2021 0856 Gross per 24 hour  Intake 1461.98 ml  Output 2350 ml  Net -888.02 ml    Intake/Output this shift: Total I/O In: 480 [P.O.:480] Out: -   Labs: Recent Labs    10/12/21 0430 10/14/21 0355  HGB 8.2* 7.7*   Recent Labs    10/12/21 0430 10/14/21 0355  WBC 8.8 8.4  RBC 2.86* 2.67*  HCT 28.0* 26.5*  PLT 276 238   Recent Labs    10/13/21 0414 10/14/21 0355  NA 142 141  K 3.9 3.4*  CL 93* 93*  CO2 41* 39*  BUN 43* 39*  CREATININE 1.41* 1.37*  GLUCOSE 114* 109*  CALCIUM 8.7* 8.7*   No results for input(s): LABPT, INR in the last 72 hours.  Exam: General - Patient is Alert and Oriented Extremity - Neurologically intact Neurovascular intact Sensation intact distally Dorsiflexion/Plantar flexion intact Dressing/Incision - Woundvac in place Motor Function - intact, moving foot and toes well on exam.   Past Medical History:  Diagnosis Date   Anemia    Arthritis    OA RIGHT KNEE WITH PAIN   Barrett esophagus    BiPAP (biphasic positive airway pressure)    Wears at night   Bradycardia 06/01/2015   CAD in native artery    a. NSTEMI 05/2015 s/p emergent CABG.   Chronic diastolic (congestive) heart failure (HCC)    Chronic kidney disease, stage 3a (HCC)    Chronic respiratory failure (HCC)    Chronic  respiratory failure with hypoxia and hypercapnia (HCC) 02/04/2010   Followed in Pulmonary clinic/ [facility]/ [place]       - 02 dependent  since 07/02/10 >>  83% RA December 05, 2010       - ONO RA 08/05/12  :  Positive sat < 89 x 2:88m> repeat on 2lpm rec 08/12/2012  - 06/17/2013 reported desat with activity p Knee surgery > rec restart 2lpm with activity  - 06/27/2013   Walked 2lpm  x one lap @ 185 stopped due to sat 88% not sob , desat to 82% on RA just at th   COPD III spirometry if use FEV1/VC p saba  07/18/2010   Quit smoking May 2006       - PFT's  04/12/10 FEV1  1.21 (69%) ratio 77 and no change p B2,  DLC0 56%   VC 70%         - PFTs  08/08/2013 FEV1 1.21 (60%) ratio 86 and no change p B2 DLCO 79%  VC 72%  On symbicort 160 2bid  - PFT's  02/08/2018  FEV1 0.70 (40 % ) ratio 56 if use FEV1/VC  p 38 % improvement from saba p symb 160 prior to study with DLCO  78 % corrects to 147  % for alv volume   -  02/08/2018   Cough variant asthma 02/26/2011   Followed in Pulmonary clinic/ [facility]/ [place]  - PFT's  06/04/15  FEV1 1.20 (67 % ) ratio 83  p 6 % improvement from saba with DLCO  80 % corrects to 132 % for alv volume      - Clinical dx based on response to symbicort       FENO 09/16/2016  =   96 on symbicort 160 2bid > added singulair  Allergy profile 09/16/2016 >  Eos 0.5 /  IgE  78 neg RAST  -  Referred to rehab 04/29/2017 > completed   Depression    Dysrhythmia    Afib   Essential hypertension 04/20/2007   Qualifier: Diagnosis of  By: Paulina Fusi, RN, Daine Gravel    GERD (gastroesophageal reflux disease)    History of ARDS 2006   History of home oxygen therapy    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 small intracranial hemorrhage in setting of HTN.   Mild carotid artery disease ([place])    a. Duplex 1-39% bilaterally in 2016.   Morbid (severe) obesity due to excess calories ([place]) 04/22/2015   pfts with erv 14%  06/04/15  And 33% 02/08/2018    NSTEMI (non-ST elevated myocardial infarction) ([place]) 05/31/2015   Pneumococcal pneumonia ([place]) 2006   HOSPITALIZED AND DEVELOPED ARDS   Psoriasis    Psoriatic arthritis ([place])    PULMONARY FIBROSIS ILD POST INFLAMMATORY CHRONIC 07/18/2010   Followed as Primary Care Patient/ [facility]/ [place]  -s/p ARDS 2006 with bacteremic S  Pna       - CT chest 07/03/10 Nonspecific PF mostly upper lobes       - CT chest 12/03/10 acute gg changes and effusions c/w chf - PFT's  02/08/2018  FVC 0.64 (28 %)   with DLCO  78 % corrects to 147 % for alv volume      Rhematoid arthritis    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   SOB (shortness of breath) on exertion     Assessment/Plan: 4 Days Post-Op Procedure(s) (LRB): IRRIGATION AND DEBRIDEMENT HIP WITH WOUND VAC (Left) Principal Problem:   Hematoma of left hip Active Problems:   COPD III spirometry if use FEV1/VC p saba    Chronic respiratory failure with hypoxia and hypercapnia (HCC)   Atrial fibrillation (HCC)   Chronic heart failure with preserved ejection fraction (HCC)   Stage 3b chronic kidney disease (HCC)   CAD in native artery   ILD (interstitial lung disease) (HCC)   Periprosthetic fracture of femur following total replacement of hip   Fluid collection at surgical site   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   Weight as of this encounter: 92 kg.  Currently on IV vancomycin. Awaiting sensitivities prior to discharge. Daughter just tested positive for covid, will likely wait until Wednesday for discharge so she has adequate help at home.   Theresa Duty, PA-C Orthopedic Surgery [phone] 10/14/2021, 11:27 AM

## 2021-10-14 NOTE — Care Management Important Message (Signed)
Important Message  Patient Details IM Letter placed in Patients room. Name: [name] MRN: [id number] Date of Birth: January 10, 1946   Medicare Important Message Given:  Yes     Caren Macadam 10/14/2021, 12:18 PM

## 2021-10-14 NOTE — Progress Notes (Signed)
PROGRESS NOTE    [name]  [id number] DOB: 1946/05/08 DOA: 10/06/2021 PCP: Isaac Bliss, Rayford Halsted, MD    Brief Narrative:  This 75 y.o. female with PMH significant for CAD, HFpEF, atrial fibrillation on Eliquis, CKD IIIb, ILD, COPD, OSA, chronic hypoxic and hypercarbic respiratory failure, RA, and osteoarthritis status post left total hip arthroplasty on 09/16/2021 who presented to [facility] on 10/05/2021 with increasing left hip pain.  CT of the left hip concerning for periprosthetic intertrochanteric left femur fracture and periprosthetic fluid collections. CT chest negative for PE.  She was seen by Dr. Wynelle Link and is recommending nonsurgical management due to multiple comorbidities.  Patient is admitted for Periprosthetic hip fracture with fluid collection.  She underwent irrigation and debridement of left hip with wound VAC on 12/8. Intra operative cultures growing staph aureus, sensitivities pending. Continue vancomycin and cefepime for now.   Assessment & Plan:   Principal Problem:   Hematoma of left hip Active Problems:   COPD III spirometry if use FEV1/VC p saba    Chronic respiratory failure with hypoxia and hypercapnia (HCC)   Atrial fibrillation (HCC)   Chronic heart failure with preserved ejection fraction (HCC)   Stage 3b chronic kidney disease (HCC)   CAD in native artery   ILD (interstitial lung disease) ([place])   Periprosthetic fracture of femur following total replacement of hip   Fluid collection at surgical site   Periprosthetic fracture around internal prosthetic left hip joint ([place])   Left hip pain secondary to periprosthetic hip fracture: Patient s/p left total hip arthroplasty on 09/16/2021. She now complains of increasing left hip pain and drainage. CT scan done in an outside hospital concerning for periprosthetic fracture and fluid collections.  Patient seen by Ortho and recommended nonoperative treatment with TTWB  initially. Continue IV cefepime.  Given increased drainage and fluid pocket seen on CT scan,  Ortho recommended it is best to proceed with an irrigation and debridement of the wound on 10/10/21.   She underwent irrigation and debridement of left hip with wound VAC on 12/8. Continue current management, intra operative cultures growing staph aureus, sensitivities pending. Ortho advised to add vancomycin to cefepime.  Patient might need PICC line.  Chronic hypoxic and hypercapnic respiratory failure sec. to COPD, OSA, ILD: Chest x-ray on 10/07/2021 showed cardiomegaly with interstitial edema and pleural effusion.   Continue torsemide 20 mg twice a day. Continue nebulizer,  supplemental oxygen and BiPAP nightly.   Chronic atrial fibrillation: Heart rate controlled.  Continue Cardizem and Tikosyn. Continue Eliquis.  Chronic diastolic heart failure: Stable. Last LVEF 65 to 70% in 10/22 Continue torsemide.  AKI on CKD stage IIIb: Monitor renal functions on torsemide. Serum creatinine slightly up to 1.37  Rheumatoid arthritis: Continue leflunomide.  Anemia of chronic disease: Hemoglobin currently stable. Continue to monitor H&H.  History of CAD and CABG: Continue Lipitor      DVT prophylaxis:Eliquis Code Status: DNR Family Communication: Daughter is at bedside. Disposition Plan:   Status is: Inpatient  Remains inpatient appropriate because: Possible fluid collection , underwent debridement and incision and drainage.  Intraoperative cultures growing staph aureus , sensitivities pending.  Vancomycin added to cefepime. Patient may require PICC line for long-term antibiotics.  Consultants:  Orthopedics  Procedures: Debridement and incision and drainage Antimicrobials:  Anti-infectives (From admission, onward)    Start     Dose/Rate Route Frequency Ordered Stop   10/13/21 1000  vancomycin (VANCOREADY) IVPB 1250 mg/250 mL  1,250 mg 166.7 mL/hr over 90 Minutes Intravenous  Every 48 hours 10/13/21 0859     10/09/21 1300  ceFAZolin (ANCEF) IVPB 2g/100 mL premix  Status:  Discontinued        2 g 200 mL/hr over 30 Minutes Intravenous On call to O.R. 10/09/21 1239 10/09/21 1322   10/08/21 1330  ceFEPIme (MAXIPIME) 2 g in sodium chloride 0.9 % 100 mL IVPB        2 g 200 mL/hr over 30 Minutes Intravenous Every 12 hours 10/08/21 1237     10/07/21 1700  ceFAZolin (ANCEF) IVPB 2g/100 mL premix  Status:  Discontinued        2 g 200 mL/hr over 30 Minutes Intravenous Every 8 hours 10/07/21 1614 10/08/21 1237        Subjective: Patient was seen and examined at bedside.  Overnight events noted. Patient is s/p debridement and irrigation with wound VAC. Patient reports pain is manageable.  Patient reports her daughter is COVID+.  Objective: Vitals:   10/14/21 0556 10/14/21 0628 10/14/21 0629 10/14/21 0630  BP: (!) 130/56     Pulse: 66     Resp: 20     Temp: 98.6 F (37 C)     TempSrc: Oral     SpO2: 98% 97% 97% 97%  Weight:      Height:        Intake/Output Summary (Last 24 hours) at 10/14/2021 1324 Last data filed at 10/14/2021 0856 Gross per 24 hour  Intake 1221.98 ml  Output 2350 ml  Net -1128.02 ml   Filed Weights   10/12/21 0500 10/13/21 0500 10/14/21 0500  Weight: 93.3 kg 92.3 kg 92 kg    Examination:  General exam: Appears comfortable, morbidly obese, deconditioned, not in any distress. Respiratory system: Clear to auscultation bilaterally.  Respiratory effort normal.  RR 13 Cardiovascular system: S1-S2 heard, Irregular rate and rhythm, no murmur.   Gastrointestinal system: Abdomen is soft, non tender, nondistended, BS+ Central nervous system: Alert and oriented x3. No focal neurological deficits. Extremities: Left hip tenderness+, wound VAC noted with good suction. Skin: No rashes, lesions or ulcers Psychiatry: Judgement and insight appear normal. Mood & affect appropriate.     Data Reviewed: I have personally reviewed following labs  and imaging studies  CBC: Recent Labs  Lab 10/09/21 0406 10/10/21 0413 10/11/21 0435 10/12/21 0430 10/14/21 0355  WBC 8.2 9.3 8.3 8.8 8.4  HGB 8.3* 8.8* 8.1* 8.2* 7.7*  HCT 28.6* 29.9* 27.9* 28.0* 26.5*  MCV 99.0 99.3 99.6 97.9 99.3  PLT 272 292 293 276 238   Basic Metabolic Panel: Recent Labs  Lab 10/10/21 0413 10/11/21 0435 10/12/21 0430 10/13/21 0414 10/14/21 0355  NA 143 144 143 142 141  K 3.3* 4.1 3.6 3.9 3.4*  CL 93* 96* 92* 93* 93*  CO2 41* 38* 42* 41* 39*  GLUCOSE 113* 167* 117* 114* 109*  BUN 28* 36* 38* 43* 39*  CREATININE 1.09* 1.37* 1.35* 1.41* 1.37*  CALCIUM 9.2 8.7* 8.8* 8.7* 8.7*  MG 1.7 1.8 1.6* 1.8  --   PHOS 2.5  --  3.0 3.4  --    GFR: Estimated Creatinine Clearance: 36.5 mL/min (A) (by C-G formula based on SCr of 1.37 mg/dL (H)). Liver Function Tests: No results for input(s): AST, ALT, ALKPHOS, BILITOT, PROT, ALBUMIN in the last 168 hours.  No results for input(s): LIPASE, AMYLASE in the last 168 hours. No results for input(s): AMMONIA in the last 168 hours. Coagulation Profile: No results  for input(s): INR, PROTIME in the last 168 hours. Cardiac Enzymes: No results for input(s): CKTOTAL, CKMB, CKMBINDEX, TROPONINI in the last 168 hours. BNP (last 3 results) Recent Labs    06/13/21 1303  PROBNP 588*   HbA1C: No results for input(s): HGBA1C in the last 72 hours. CBG: No results for input(s): GLUCAP in the last 168 hours. Lipid Profile: No results for input(s): CHOL, HDL, LDLCALC, TRIG, CHOLHDL, LDLDIRECT in the last 72 hours. Thyroid Function Tests: No results for input(s): TSH, T4TOTAL, FREET4, T3FREE, THYROIDAB in the last 72 hours. Anemia Panel: No results for input(s): VITAMINB12, FOLATE, FERRITIN, TIBC, IRON, RETICCTPCT in the last 72 hours. Sepsis Labs: Recent Labs  Lab 10/08/21 0413  PROCALCITON <0.10    Recent Results (from the past 240 hour(s))  Aerobic/Anaerobic Culture w Gram Stain (surgical/deep wound)     Status:  None (Preliminary result)   Collection Time: 10/10/21  6:31 PM   Specimen: Synovial, Left Hip; Body Fluid  Result Value Ref Range Status   Specimen Description   Final    SYNOVIAL LEFT HIP Performed at [facility] [address] [place], [place] [address]    Special Requests   Final    NONE Performed at [facility], [place] [address] [place], [place] [address]    Gram Stain   Final    MODERATE WBC PRESENT,BOTH PMN AND MONONUCLEAR MODERATE GRAM POSITIVE COCCI Performed at [facility], [place] [address] [place], [place] [address]    Culture   Final    RARE METHICILLIN RESISTANT STAPHYLOCOCCUS AUREUS NO ANAEROBES ISOLATED; CULTURE IN PROGRESS FOR 5 DAYS    Report Status PENDING  Incomplete   Organism ID, Bacteria METHICILLIN RESISTANT STAPHYLOCOCCUS AUREUS  Final      Susceptibility   Methicillin resistant staphylococcus aureus - MIC*    CIPROFLOXACIN >=8 RESISTANT Resistant     ERYTHROMYCIN >=8 RESISTANT Resistant     GENTAMICIN <=0.5 SENSITIVE Sensitive     OXACILLIN >=4 RESISTANT Resistant     TETRACYCLINE >=16 RESISTANT Resistant     VANCOMYCIN 1 SENSITIVE Sensitive     TRIMETH/SULFA <=10 SENSITIVE Sensitive     CLINDAMYCIN >=8 RESISTANT Resistant     RIFAMPIN <=0.5 SENSITIVE Sensitive     Inducible Clindamycin NEGATIVE Sensitive     * RARE METHICILLIN RESISTANT STAPHYLOCOCCUS AUREUS  Resp Panel by RT-PCR (Flu A&B, Covid) Nasopharyngeal Swab     Status: None   Collection Time: 10/12/21  1:40 PM   Specimen: Nasopharyngeal Swab; Nasopharyngeal(NP) swabs in vial transport medium  Result Value Ref Range Status   SARS Coronavirus 2 by RT PCR NEGATIVE NEGATIVE Final    Comment: (NOTE) SARS-CoV-2 target nucleic acids are NOT DETECTED.  The SARS-CoV-2 RNA is generally detectable in upper respiratory specimens during the acute phase of infection. The lowest concentration of SARS-CoV-2 viral copies this assay can detect is 138  copies/mL. A negative result does not preclude SARS-Cov-2 infection and should not be used as the sole basis for treatment or other patient management decisions. A negative result may occur with  improper specimen collection/handling, submission of specimen other than nasopharyngeal swab, presence of viral mutation(s) within the areas targeted by this assay, and inadequate number of viral copies(<138 copies/mL). A negative result must be combined with clinical observations, patient history, and epidemiological information. The expected result is Negative.  Fact Sheet for Patients:  EntrepreneurPulse.com.au  Fact Sheet for Healthcare Providers:  IncredibleEmployment.be  This test is no t yet  approved or cleared by the [place] and  has been authorized for detection and/or diagnosis of SARS-CoV-2 by FDA under an Emergency Use Authorization (EUA). This EUA will remain  in effect (meaning this test can be used) for the duration of the COVID-19 declaration under Section 564(b)(1) of the Act, 21 U.S.C.section 360bbb-3(b)(1), unless the authorization is terminated  or revoked sooner.       Influenza A by PCR NEGATIVE NEGATIVE Final   Influenza B by PCR NEGATIVE NEGATIVE Final    Comment: (NOTE) The Xpert Xpress SARS-CoV-2/FLU/RSV plus assay is intended as an aid in the diagnosis of influenza from Nasopharyngeal swab specimens and should not be used as a sole basis for treatment. Nasal washings and aspirates are unacceptable for Xpert Xpress SARS-CoV-2/FLU/RSV testing.  Fact Sheet for Patients: EntrepreneurPulse.com.au  Fact Sheet for Healthcare Providers: IncredibleEmployment.be  This test is not yet approved or cleared by the [place] FDA and has been authorized for detection and/or diagnosis of SARS-CoV-2 by FDA under an Emergency Use Authorization (EUA). This EUA will remain in effect (meaning  this test can be used) for the duration of the COVID-19 declaration under Section 564(b)(1) of the Act, 21 U.S.C. section 360bbb-3(b)(1), unless the authorization is terminated or revoked.  Performed at [facility], [place] [address], [place], [place] [address]      Radiology Studies: No results found.  Scheduled Meds:  apixaban  5 mg Oral BID   arformoterol  15 mcg Nebulization Q12H   atorvastatin  40 mg Oral Daily   budesonide  0.25 mg Nebulization BID   cholecalciferol  2,000 Units Oral Daily   diltiazem  240 mg Oral Daily   dofetilide  250 mcg Oral BID   DULoxetine  30 mg Oral Daily   leflunomide  20 mg Oral Daily   levothyroxine  125 mcg Oral Q0600   metoprolol tartrate  12.5 mg Oral BID   montelukast  10 mg Oral Daily   pantoprazole  40 mg Oral QAC breakfast   pregabalin  50 mg Oral QHS   sodium chloride flush  3 mL Intravenous Q12H   torsemide  20 mg Oral BID   umeclidinium bromide  1 puff Inhalation Daily   Continuous Infusions:  sodium chloride     ceFEPime (MAXIPIME) IV 2 g (10/14/21 0953)   vancomycin 1,250 mg (10/13/21 1143)     LOS: 8 days    Time spent: 25 mins    Rayleigh Gillyard, MD [facility]   If 7PM-7AM, please contact night-coverage

## 2021-10-15 DIAGNOSIS — S7002XD Contusion of left hip, subsequent encounter: Secondary | ICD-10-CM | POA: Diagnosis not present

## 2021-10-15 LAB — BASIC METABOLIC PANEL
Anion gap: 8 (ref 5–15)
BUN: 43 mg/dL — ABNORMAL HIGH (ref 8–23)
CO2: 37 mmol/L — ABNORMAL HIGH (ref 22–32)
Calcium: 8.8 mg/dL — ABNORMAL LOW (ref 8.9–10.3)
Chloride: 96 mmol/L — ABNORMAL LOW (ref 98–111)
Creatinine, Ser: 1.38 mg/dL — ABNORMAL HIGH (ref 0.44–1.00)
GFR, Estimated: 40 mL/min — ABNORMAL LOW (ref >60)
Glucose, Bld: 105 mg/dL — ABNORMAL HIGH (ref 70–99)
Potassium: 4.2 mmol/L (ref 3.5–5.1)
Sodium: 141 mmol/L (ref 135–145)

## 2021-10-15 LAB — AEROBIC/ANAEROBIC CULTURE W GRAM STAIN (SURGICAL/DEEP WOUND)

## 2021-10-15 MED ORDER — LINEZOLID 600 MG PO TABS
600.0000 mg | ORAL_TABLET | Freq: Two times a day (BID) | ORAL | Status: DC
  Administered 2021-10-15 – 2021-10-16 (×3): 600 mg via ORAL
  Filled 2021-10-15 (×3): qty 1

## 2021-10-15 MED ORDER — SULFAMETHOXAZOLE-TRIMETHOPRIM 800-160 MG PO TABS: 1.0000 | ORAL_TABLET | Freq: Two times a day (BID) | ORAL | Status: DC

## 2021-10-15 NOTE — Progress Notes (Addendum)
PROGRESS NOTE    [name]  WUJ:[id number] DOB: 1945/11/10 DOA: 10/06/2021 PCP: [name], [name], MD    Brief Narrative:  This 75 y.o. female with PMH significant for CAD, HFpEF, atrial fibrillation on Eliquis, CKD IIIb, ILD, COPD, OSA, chronic hypoxic and hypercarbic respiratory failure, RA, and osteoarthritis status post left total hip arthroplasty on 09/16/2021 who presented to [facility] on 10/05/2021 with increasing left hip pain.  CT of the left hip concerning for periprosthetic intertrochanteric left femur fracture and periprosthetic fluid collections. CT chest negative for PE.  She was seen by Dr. Lequita Halt and is recommending nonsurgical management due to multiple comorbidities initially. Patient was admitted for Periprosthetic hip fracture with fluid collection.  She underwent irrigation and debridement of left hip with wound VAC on 12/8. Intra operative cultures growing staph aureus, Patient continued on vancomycin and cefepime.  Patient can be discharged home tomorrow on linezolid.   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 She now complains of increasing left hip pain and drainage. CT scan done in an outside hospital concerning for periprosthetic fracture and fluid collections.  Patient seen by Ortho and recommended  nonoperative treatment with TTWB initially. Continue IV cefepime.  Given increased drainage and fluid pocket seen on CT scan,  Ortho recommended it is best to proceed with an irrigation and debridement of the wound on 10/10/21.   She underwent irrigation and debridement of left hip with wound VAC on 12/8. Continue current management, intra operative cultures growing staph aureus, sensitivities pending. Ortho advised to add vancomycin to cefepime.  Cultures finalized showing MRSA sensitive to Bactrim, linezolid.  Since the infection was localized to subcutaneous tissue, will treat with p.o. antibiotics. Patient can be discharged on linezolid 600 mg twice daily   Chronic hypoxic and hypercapnic respiratory failure sec. to COPD, OSA, ILD: Chest x-ray on 10/07/2021 showed cardiomegaly with interstitial edema and pleural effusion.   Continue torsemide 20 mg twice a day. Continue nebulizer,  supplemental oxygen and BiPAP nightly.   Chronic atrial fibrillation: Heart rate controlled.  Continue Cardizem and Tikosyn. Continue Eliquis.  Chronic diastolic heart failure: Stable. Last LVEF 65 to 70% in 10/22 Continue torsemide.  AKI on CKD stage IIIb: Monitor renal functions on torsemide. Serum creatinine at baseline.  Rheumatoid arthritis: Continue leflunomide.  Anemia of chronic disease: Hemoglobin currently stable. Continue to monitor H&H.  History of CAD and CABG: Continue Lipitor      DVT prophylaxis:Eliquis Code Status: DNR Family Communication: No family at bedside. Disposition Plan:   Status is: Inpatient  Remains inpatient appropriate because: Possible fluid collection , underwent debridement and incision and drainage.  Intraoperative cultures growing staph aureus , sensitivities pending.  Vancomycin added to cefepime.  Anticipated discharge home on 12/14 on linezolid and wound VAC.  Daughter who is her primary caretaker is COVID+, She  is Careers information officer.   Consultants:  Orthopedics  Procedures: Debridement and incision and drainage Antimicrobials:  Anti-infectives (From admission, onward)    Start     Dose/Rate Route Frequency Ordered Stop   10/15/21 1000  sulfamethoxazole-trimethoprim (BACTRIM DS) 800-160 MG per tablet 1 tablet  Status:  Discontinued        1 tablet Oral Every 12 hours 10/15/21 0728 10/15/21 0759   10/15/21 1000  linezolid (ZYVOX) tablet 600 mg        600 mg Oral Every 12 hours 10/15/21 0759     10/13/21 1000  vancomycin (VANCOREADY) IVPB 1250 mg/250 mL        1,250 mg 166.7 mL/hr over 90 Minutes Intravenous Every 48 hours 10/13/21 0859     10/09/21 1300  ceFAZolin (ANCEF) IVPB 2g/100 mL premix  Status:  Discontinued        2 g 200 mL/hr over 30 Minutes Intravenous On call to O.R. 10/09/21 1239 10/09/21 1322   10/08/21 1330  ceFEPIme (MAXIPIME) 2 g in sodium chloride 0.9 % 100 mL IVPB  Status:  Discontinued        2 g 200 mL/hr over 30 Minutes Intravenous Every 12 hours 10/08/21 1237 10/15/21 0728   10/07/21 1700  ceFAZolin (ANCEF) IVPB 2g/100 mL premix  Status:  Discontinued        2 g 200 mL/hr over 30 Minutes Intravenous Every 8 hours 10/07/21 1614 10/08/21 1237        Subjective: Patient was seen and examined at bedside.  Overnight events noted. Patient is s/p debridement and irrigation with wound VAC. Patient reports pain is manageable.  Patient reports her daughter is COVID+.   Objective: Vitals:   10/14/21 2200 10/15/21 0500 10/15/21 0617 10/15/21 0755  BP:   (!) 128/96   Pulse:   85   Resp: 20  16   Temp:   98.3 F (36.8 C)   TempSrc:   Oral   SpO2:   94% 95%  Weight:  (!) 205 kg    Height:        Intake/Output Summary (Last 24 hours) at 10/15/2021 1359 Last data filed at 10/15/2021 0353 Gross per 24 hour  Intake --  Output 1650 ml  Net -1650 ml   Filed Weights   10/13/21 0500 10/14/21 0500 10/15/21 0500  Weight: 92.3 kg 92 kg (!) 205 kg    Examination:  General  exam: Morbidly obese, comfortable, not in any distress, deconditioned. Respiratory system: Clear to auscultation bilaterally.  Respiratory effort normal.  RR 13 Cardiovascular system: S1-S2 heard, Irregular rate and rhythm, no murmur.   Gastrointestinal system: Abdomen is soft, non tender, nondistended, BS+ Central nervous system: Alert and oriented x3. No focal neurological deficits. Extremities: Left hip tenderness+, wound VAC noted with good suction. Skin: No rashes, lesions or ulcers Psychiatry: Judgement and insight appear normal. Mood & affect appropriate.     Data Reviewed: I have personally reviewed following labs and imaging studies  CBC: Recent Labs  Lab 10/09/21 0406 10/10/21 0413 10/11/21 0435 10/12/21 0430 10/14/21 0355 10/14/21 1352  WBC 8.2 9.3 8.3 8.8 8.4  --   HGB 8.3* 8.8* 8.1* 8.2* 7.7* 8.2*  HCT 28.6* 29.9* 27.9* 28.0* 26.5* 27.8*  MCV 99.0 99.3 99.6 97.9 99.3  --   PLT 272 292 293 276 238  --    Basic Metabolic Panel: Recent Labs  Lab 10/10/21 0413 10/11/21 0435 10/12/21 0430 10/13/21 0414 10/14/21 0355 10/15/21 0406  NA 143  144 143 142 141 141  K 3.3* 4.1 3.6 3.9 3.4* 4.2  CL 93* 96* 92* 93* 93* 96*  CO2 41* 38* 42* 41* 39* 37*  GLUCOSE 113* 167* 117* 114* 109* 105*  BUN 28* 36* 38* 43* 39* 43*  CREATININE 1.09* 1.37* 1.35* 1.41* 1.37* 1.38*  CALCIUM 9.2 8.7* 8.8* 8.7* 8.7* 8.8*  MG 1.7 1.8 1.6* 1.8  --   --   PHOS 2.5  --  3.0 3.4  --   --    GFR: Estimated Creatinine Clearance: 61.7 mL/min (A) (by C-G formula based on SCr of 1.38 mg/dL (H)). Liver Function Tests: No results for input(s): AST, ALT, ALKPHOS, BILITOT, PROT, ALBUMIN in the last 168 hours.  No results for input(s): LIPASE, AMYLASE in the last 168 hours. No results for input(s): AMMONIA in the last 168 hours. Coagulation Profile: No results for input(s): INR, PROTIME in the last 168 hours. Cardiac Enzymes: No results for input(s): CKTOTAL, CKMB, CKMBINDEX, TROPONINI in the  last 168 hours. BNP (last 3 results) Recent Labs    06/13/21 1303  PROBNP 588*   HbA1C: No results for input(s): HGBA1C in the last 72 hours. CBG: No results for input(s): GLUCAP in the last 168 hours. Lipid Profile: No results for input(s): CHOL, HDL, LDLCALC, TRIG, CHOLHDL, LDLDIRECT in the last 72 hours. Thyroid Function Tests: No results for input(s): TSH, T4TOTAL, FREET4, T3FREE, THYROIDAB in the last 72 hours. Anemia Panel: No results for input(s): VITAMINB12, FOLATE, FERRITIN, TIBC, IRON, RETICCTPCT in the last 72 hours. Sepsis Labs: No results for input(s): PROCALCITON, LATICACIDVEN in the last 168 hours.   Recent Results (from the past 240 hour(s))  Fungus Culture With Stain     Status: None (Preliminary result)   Collection Time: 10/10/21  6:31 PM   Specimen: Synovial, Left Hip; Body Fluid  Result Value Ref Range Status   Fungus Stain Final report  Final    Comment: (NOTE) Performed At: [facility] [address] [place], [place] [id number] Jolene Schimke MD IO:[id number]    Fungus (Mycology) Culture PENDING  Incomplete   Fungal Source SYNOVIAL  Final    Comment: LEFT HIP Performed at [facility], [address] [place], [place] [address]   Aerobic/Anaerobic Culture w Gram Stain (surgical/deep wound)     Status: None   Collection Time: 10/10/21  6:31 PM   Specimen: Synovial, Left Hip; Body Fluid  Result Value Ref Range Status   Specimen Description   Final    SYNOVIAL LEFT HIP Performed at [facility], [address] [place], [place] [address]    Special Requests   Final    NONE Performed at [facility], [address], [place], [place] [address]    Gram Stain   Final    MODERATE WBC PRESENT,BOTH PMN AND MONONUCLEAR MODERATE GRAM POSITIVE COCCI    Culture   Final    RARE METHICILLIN RESISTANT STAPHYLOCOCCUS AUREUS NO ANAEROBES ISOLATED Performed at [facility], [address] [place], [place] [address]    Report Status 10/15/2021 FINAL  Final   Organism ID, Bacteria METHICILLIN RESISTANT STAPHYLOCOCCUS AUREUS  Final      Susceptibility   Methicillin resistant staphylococcus aureus - MIC*    CIPROFLOXACIN >=8 RESISTANT Resistant     ERYTHROMYCIN >=8 RESISTANT Resistant     GENTAMICIN <=0.5 SENSITIVE Sensitive     OXACILLIN >=4 RESISTANT Resistant     TETRACYCLINE >=16 RESISTANT Resistant  VANCOMYCIN 1 SENSITIVE Sensitive     TRIMETH/SULFA <=10 SENSITIVE Sensitive     CLINDAMYCIN >=8 RESISTANT Resistant     RIFAMPIN <=0.5 SENSITIVE Sensitive     Inducible Clindamycin NEGATIVE Sensitive     * RARE METHICILLIN RESISTANT STAPHYLOCOCCUS AUREUS  Acid Fast Smear (AFB)     Status: None   Collection Time: 10/10/21  6:31 PM   Specimen: Synovial, Left Hip; Body Fluid  Result Value Ref Range Status   AFB Specimen Processing Comment  Final    Comment: Tissue Grinding and Digestion/Decontamination   Acid Fast Smear Negative  Final    Comment: (NOTE) Performed At: [facility] [address] [place], [place] [id number] Jolene Schimke MD TI:[id number]    Source (AFB) SYNOVIAL  Final    Comment: LEFT HIP Performed at [facility], [address] [place], [place] [address]   Fungus Culture Result     Status: None   Collection Time: 10/10/21  6:31 PM  Result Value Ref Range Status   Result 1 Comment  Final    Comment: (NOTE) KOH/Calcofluor preparation:  no fungus observed. Performed At: [facility] [address] [place], [place] [id number] Jolene Schimke MD WP:[id number]   Resp Panel by RT-PCR (Flu A&B, Covid) Nasopharyngeal Swab     Status: None   Collection Time: 10/12/21  1:40 PM   Specimen: Nasopharyngeal Swab; Nasopharyngeal(NP) swabs in vial transport medium  Result Value Ref Range Status   SARS Coronavirus 2 by RT PCR NEGATIVE NEGATIVE Final    Comment: (NOTE) SARS-CoV-2 target nucleic acids are NOT  DETECTED.  The SARS-CoV-2 RNA is generally detectable in upper respiratory specimens during the acute phase of infection. The lowest concentration of SARS-CoV-2 viral copies this assay can detect is 138 copies/mL. A negative result does not preclude SARS-Cov-2 infection and should not be used as the sole basis for treatment or other patient management decisions. A negative result may occur with  improper specimen collection/handling, submission of specimen other than nasopharyngeal swab, presence of viral mutation(s) within the areas targeted by this assay, and inadequate number of viral copies(<138 copies/mL). A negative result must be combined with clinical observations, patient history, and epidemiological information. The expected result is Negative.  Fact Sheet for Patients:  BloggerCourse.com  Fact Sheet for Healthcare Providers:  SeriousBroker.it  This test is no t yet approved or cleared by the [place] FDA and  has been authorized for detection and/or diagnosis of SARS-CoV-2 by FDA under an Emergency Use Authorization (EUA). This EUA will remain  in effect (meaning this test can be used) for the duration of the COVID-19 declaration under Section 564(b)(1) of the Act, 21 U.S.C.section 360bbb-3(b)(1), unless the authorization is terminated  or revoked sooner.       Influenza A by PCR NEGATIVE NEGATIVE Final   Influenza B by PCR NEGATIVE NEGATIVE Final    Comment: (NOTE) The Xpert Xpress SARS-CoV-2/FLU/RSV plus assay is intended as an aid in the diagnosis of influenza from Nasopharyngeal swab specimens and should not be used as a sole basis for treatment. Nasal washings and aspirates are unacceptable for Xpert Xpress SARS-CoV-2/FLU/RSV testing.  Fact Sheet for Patients: BloggerCourse.com  Fact Sheet for Healthcare Providers: SeriousBroker.it  This test is not yet  approved or cleared by the [place] FDA and has been authorized for detection and/or diagnosis of SARS-CoV-2 by FDA under an Emergency Use Authorization (EUA). This EUA will remain in effect (meaning this test can be used) for the duration  of the COVID-19 declaration under Section 564(b)(1) of the Act, 21 U.S.C. section 360bbb-3(b)(1), unless the authorization is terminated or revoked.  Performed at [facility], [address], [place], [place] [address]      Radiology Studies: No results found.  Scheduled Meds:  apixaban  5 mg Oral BID   arformoterol  15 mcg Nebulization Q12H   atorvastatin  40 mg Oral Daily   budesonide  0.25 mg Nebulization BID   cholecalciferol  2,000 Units Oral Daily   diltiazem  240 mg Oral Daily   dofetilide  250 mcg Oral BID   DULoxetine  30 mg Oral Daily   leflunomide  20 mg Oral Daily   levothyroxine  125 mcg Oral Q0600   linezolid  600 mg Oral Q12H   metoprolol tartrate  12.5 mg Oral BID   montelukast  10 mg Oral Daily   pantoprazole  40 mg Oral QAC breakfast   pregabalin  50 mg Oral QHS   sodium chloride flush  3 mL Intravenous Q12H   torsemide  20 mg Oral BID   umeclidinium bromide  1 puff Inhalation Daily   Continuous Infusions:  sodium chloride     vancomycin 1,250 mg (10/15/21 0936)     LOS: 9 days    Time spent: 25 mins    Mykenzi Vanzile, MD [facility]   If 7PM-7AM, please contact night-coverage

## 2021-10-15 NOTE — Evaluation (Signed)
Physical Therapy Evaluation Patient Details Name: [name] MRN: [id number] DOB: June 08, 1946 Today's Date: 10/15/2021  History of Present Illness  Pt is a 74 y.o. female s/p Lt THA on 09/16/21. PMH including but not limited to CHF, CKD III, CAD s/p CABG (2016), COPD on 2L O2 at baseline, OA, hypothyroidism, NSTEMI, obesity, HLD, depression, L TKA, and R TKA  . Patient  presented to [facility] on 10/05/2021 with increasing left hip pain.  CT of the left hip concerning for periprosthetic intertrochanteric left femur fracture and periprosthetic fluid collections. Transfer to [facility]  was admitted 10/06/21  for Periprosthetic hip fracture with fluid collection.  She underwent irrigation and debridement of left hip with wound VAC on 12/8. Nonsurgical management of fracture, TDWB.Marland Kitchen  Clinical Impression  The patient has essentially been bed bound x 8 days, reports sitting on bed edge a few times.  Patient is slated to Dc home tomorrow.  Patient is  TDWB on the LLE at this time. Patient required min assistance  to stand x 3 from bed, able to maintain TDWB. Patient reports dizziness, noted right leg shaking, did not attempt transfer. Patient sat on bed edge for 10 minutes.  PT recommends non emergency transport home due to these  factors: Patient would have to transfer 4 times to/from [facility] if goes by personal car, placing increased risk for WB and falls-patient has been bedrest x 8 days and is weak .  Anticipate , once in her home, she can transfer  safely to/from [employer] and BSC/bed with family assisting as PTA. Patient has not sat up for any length of time x  8 days and reports dizziness today. Also, patient's daughter has been positive for Covid  Patient encouraged to work on sitting on bed edge/ in Wc and practice standing with TDWB when she gets home. Patient is not having HHPT per  MD. PT will see patient in AM. If patient transfers  by EMS, family will need to pick up her WC. Pt admitted  with above diagnosis.  Pt currently with functional limitations due to the deficits listed below (see PT Problem List). Pt will benefit from skilled PT to increase their independence and safety with mobility to allow discharge to the venue listed below.         Recommendations for follow up therapy are one component of a multi-disciplinary discharge planning process, led by the attending physician.  Recommendations may be updated based on patient status, additional functional criteria and insurance authorization.  Follow Up Recommendations Follow physician's recommendations for discharge plan and follow up therapies (no HHPT per Dr. Wynelle Link))    Assistance Recommended at Discharge Intermittent Supervision/Assistance  Functional Status Assessment Patient has had a recent decline in their functional status and demonstrates the ability to make significant improvements in function in a reasonable and predictable amount of time.  Equipment Recommendations  None recommended by PT    Recommendations for Other Services       Precautions / Restrictions Precautions Precautions: Fall Precaution Comments: home oxygen 2 lts at rest and 3 lts with activity (stated pt), Left hip VAC Restrictions LLE Weight Bearing: Touchdown weight bearing      Mobility  Bed Mobility Overal bed mobility: Needs Assistance Bed Mobility: Rolling Rolling: Min guard   Supine to sit: Min assist Sit to supine: Mod assist   General bed mobility comments: min guard assist and rails to roll and sit up onto bed edge. Required min assist for LLE to place  back onto bed.    Transfers Overall transfer level: Needs assistance Equipment used: Rolling walker (2 wheels) Transfers: Sit to/from Stand  From bed at [facility] with min steady assist    Stand pivot transfers: attempted, patient reports dizziness and legs weak.t         General transfer comment: stood x 3 at RW cues for TDWB, made attempt to pivot to  transport  chair, patient dizzy and legs and arms trembling so did not attempt transfer..    Ambulation/Gait                  Stairs            Wheelchair Mobility    Modified Rankin (Stroke Patients Only)       Balance Overall balance assessment: Needs assistance Sitting-balance support: No upper extremity supported;Feet supported Sitting balance-[name]: Good     Standing balance support: Bilateral upper extremity supported;Reliant on assistive device for balance;During functional activity Standing balance-[name]: Poor                               Pertinent Vitals/Pain Pain Assessment: No/denies pain    Home Living Family/patient expects to be discharged to:: Private residence Living Arrangements: Children;Other relatives Available Help at Discharge: Family;Available 24 hours/day Type of Home: House Home Access: Ramped entrance       Home Layout: Multi-level;Able to live on main level with bedroom/bathroom Home Equipment: Air cabin crew (4 wheels);Rolling Walker (2 wheels);Grab bars - toilet;Grab bars - tub/shower;Cane - quad;Transport chair;BSC/3in1 Additional Comments: uses 2L O2 at home throughout day    Prior Function Prior Level of Function : Needs assist       Physical Assist : Mobility (physical);ADLs (physical) Mobility (physical): Transfers;Bed mobility ADLs (physical): Bathing Mobility Comments: since DC home 11/21, has been pivoting to bed/transport chair, attempting  some ambulation.       Hand Dominance   Dominant Hand: Right    Extremity/Trunk Assessment   Upper Extremity Assessment Upper Extremity Assessment: Overall WFL for tasks assessed    Lower Extremity Assessment Lower Extremity Assessment: LLE deficits/detail LLE Deficits / Details: tends to position in ER, able to flex ~50% in supine    Cervical / Trunk Assessment Cervical / Trunk Assessment: Normal  Communication   Communication: No  difficulties  Cognition Arousal/Alertness: Awake/alert Behavior During Therapy: WFL for tasks assessed/performed Overall Cognitive Status: Within Functional Limits for tasks assessed                                          General Comments      Exercises     Assessment/Plan    PT Assessment Patient needs continued PT services  PT Problem List         PT Treatment Interventions DME instruction;Functional mobility training;Therapeutic activities;Patient/family education;Wheelchair mobility training    PT Goals (Current goals can be found in the Care Plan section)  Acute Rehab PT Goals Patient Stated Goal: Go home PT Goal Formulation: With patient Time For Goal Achievement: 10/29/21 Potential to Achieve Goals: Good    Frequency Min 5X/week   Barriers to discharge        Co-evaluation               AM-PAC PT "6 Clicks" Mobility  Outcome Measure Help needed turning from your  back to your side while in a flat bed without using bedrails?: A Little Help needed moving from lying on your back to sitting on the side of a flat bed without using bedrails?: A Little Help needed moving to and from a bed to a chair (including a wheelchair)?: Total Help needed standing up from a chair using your arms (e.g., wheelchair or bedside chair)?: Total Help needed to walk in hospital room?: Total Help needed climbing 3-5 steps with a railing? : Total 6 Click Score: 10    End of Session Equipment Utilized During Treatment: Gait belt;Oxygen Activity Tolerance: Patient tolerated treatment well Patient left: in bed;with call bell/phone within reach Nurse Communication: Mobility status PT Visit Diagnosis: Other abnormalities of gait and mobility (R26.89);Difficulty in walking, not elsewhere classified (R26.2);Muscle weakness (generalized) (M62.81);Dizziness and giddiness (R42)    Time: 4196-2229 PT Time Calculation (min) (ACUTE ONLY): 55 min   Charges:   PT  Evaluation $PT Eval Moderate Complexity: 1 Mod PT Treatments $Therapeutic Activity: 23-37 mins $Self Care/Home Management: 8-22        Blanchard Kelch PT Acute Rehabilitation Services Pager [phone] Office [phone]   Rada Hay 10/15/2021, 4:54 PM

## 2021-10-15 NOTE — Plan of Care (Signed)
Problem: Education: Goal: Knowledge of General Education information will improve Description: Including pain rating scale, medication(s)/side effects and non-pharmacologic comfort measures Outcome: Completed/Met   Problem: Health Behavior/Discharge Planning: Goal: Ability to manage health-related needs will improve Outcome: Completed/Met   Problem: Clinical Measurements: Goal: Will remain free from infection Outcome: Progressing Goal: Diagnostic test results will improve Outcome: Progressing Goal: Respiratory complications will improve Outcome: Progressing

## 2021-10-15 NOTE — Progress Notes (Addendum)
Subjective: 5 Days Post-Op Procedure(s) (LRB): IRRIGATION AND DEBRIDEMENT HIP WITH WOUND VAC (Left) Patient reports pain as mild.   Patient seen in rounds by Dr. Lequita Halt. Patient is well, and has had no acute complaints or problems Plan is to go Home after hospital stay.  Objective: Vital signs in last 24 hours: Temp:  [98.3 F (36.8 C)-99.5 F (37.5 C)] 98.3 F (36.8 C) (12/13 0617) Pulse Rate:  [68-85] 85 (12/13 0617) Resp:  [16-25] 16 (12/13 0617) BP: (111-132)/(53-96) 128/96 (12/13 0617) SpO2:  [94 %-97 %] 94 % (12/13 0617) Weight:  [205 kg] 205 kg (12/13 0500)  Intake/Output from previous day:  Intake/Output Summary (Last 24 hours) at 10/15/2021 0729 Last data filed at 10/15/2021 0353 Gross per 24 hour  Intake 480 ml  Output 1650 ml  Net -1170 ml    Intake/Output this shift: No intake/output data recorded.  Labs: Recent Labs    10/14/21 0355 10/14/21 1352  HGB 7.7* 8.2*   Recent Labs    10/14/21 0355 10/14/21 1352  WBC 8.4  --   RBC 2.67*  --   HCT 26.5* 27.8*  PLT 238  --    Recent Labs    10/14/21 0355 10/15/21 0406  NA 141 141  K 3.4* 4.2  CL 93* 96*  CO2 39* 37*  BUN 39* 43*  CREATININE 1.37* 1.38*  GLUCOSE 109* 105*  CALCIUM 8.7* 8.8*   No results for input(s): LABPT, INR in the last 72 hours.  Exam: General - Patient is Alert and Oriented Extremity - Neurologically intact Neurovascular intact Sensation intact distally Dorsiflexion/Plantar flexion intact Dressing/Incision - Prevena woundvac in place Motor Function - intact, moving foot and toes well on exam.   Past Medical History:  Diagnosis Date   Anemia    Arthritis    OA RIGHT KNEE WITH PAIN   Barrett esophagus    BiPAP (biphasic positive airway pressure)    Wears at night   Bradycardia 06/01/2015   CAD in native artery    a. NSTEMI 05/2015 s/p emergent CABG.   Chronic diastolic (congestive) heart failure (HCC)    Chronic kidney disease, stage 3a (HCC)    Chronic  respiratory failure (HCC)    Chronic respiratory failure with hypoxia and hypercapnia (HCC) 02/04/2010   Followed in Pulmonary clinic/ [facility]/ [facility]       - 02 dependent  since 07/02/10 >>  83% RA December 05, 2010       - ONO RA 08/05/12  :  Positive sat < 89 x 2:72m> repeat on 2lpm rec 08/12/2012  - 06/17/2013 reported desat with activity p Knee surgery > rec restart 2lpm with activity  - 06/27/2013   Walked 2lpm  x one lap @ 185 stopped due to sat 88% not sob , desat to 82% on RA just at th   COPD III spirometry if use FEV1/VC p saba  07/18/2010   Quit smoking May 2006       - PFT's  04/12/10 FEV1  1.21 (69%) ratio 77 and no change p B2,  DLC0 56%   VC 70%         - PFTs  08/08/2013 FEV1 1.21 (60%) ratio 86 and no change p B2 DLCO 79%  VC 72%  On symbicort 160 2bid  - PFT's  02/08/2018  FEV1 0.70 (40 % ) ratio 56 if use FEV1/VC  p 38 % improvement from saba p symb 160 prior to study with DLCO  78 %  corrects to 147  % for alv volume   - 02/08/2018   Cough variant asthma 02/26/2011   Followed in Pulmonary clinic/ [facility]/ [facility]  - PFT's  06/04/15  FEV1 1.20 (67 % ) ratio 83  p 6 % improvement from saba with DLCO  80 % corrects to 132 % for alv volume      - Clinical dx based on response to symbicort       FENO 09/16/2016  =   96 on symbicort 160 2bid > added singulair  Allergy profile 09/16/2016 >  Eos 0.5 /  IgE  78 neg RAST  -  Referred to rehab 04/29/2017 > completed   Depression    Dysrhythmia    Afib   Essential hypertension 04/20/2007   Qualifier: Diagnosis of  By: Marcelyn Ditty, RN, Katy Fitch    GERD (gastroesophageal reflux disease)    History of ARDS 2006   History of home oxygen therapy    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 small intracranial hemorrhage in setting of HTN.   Mild carotid artery disease (HCC)    a. Duplex 1-39% bilaterally in 2016.   Morbid (severe) obesity due to excess calories  (HCC) 04/22/2015   pfts with erv 14% 06/04/15  And 33% 02/08/2018    NSTEMI (non-ST elevated myocardial infarction) (HCC) 05/31/2015   Pneumococcal pneumonia (HCC) 2006   HOSPITALIZED AND DEVELOPED ARDS   Psoriasis    Psoriatic arthritis (HCC)    PULMONARY FIBROSIS ILD POST INFLAMMATORY CHRONIC 07/18/2010   Followed as Primary Care Patient/ [facility]/ [facility]  -s/p ARDS 2006 with bacteremic S  Pna       - CT chest 07/03/10 Nonspecific PF mostly upper lobes       - CT chest 12/03/10 acute gg changes and effusions c/w chf - PFT's  02/08/2018  FVC 0.64 (28 %)   with DLCO  78 % corrects to 147 % for alv volume      Rhematoid arthritis    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   SOB (shortness of breath) on exertion     Assessment/Plan: 5 Days Post-Op Procedure(s) (LRB): IRRIGATION AND DEBRIDEMENT HIP WITH WOUND VAC (Left) Principal Problem:   Hematoma of left hip Active Problems:   COPD III spirometry if use FEV1/VC p saba    Chronic respiratory failure with hypoxia and hypercapnia (HCC)   Atrial fibrillation (HCC)   Chronic heart failure with preserved ejection fraction (HCC)   Stage 3b chronic kidney disease (HCC)   CAD in native artery   ILD (interstitial lung disease) (HCC)   Periprosthetic fracture of femur following total replacement of hip   Fluid collection at surgical site   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   Weight as of this encounter: 205 kg.  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 Given that the infection was localized to the subcutaneous tissue, will tentatively hold off on PICC line. Will plan for discharge with PO Bactrim DS.  Cefepime discontinued, will keep IV vancomycin until discharge. Add in Bactrim DS today to ensure she can tolerate this.  Discharge tomorrow.  ADDENDUM: Bactrim contraindicated  with Tikosyn. Spoke with [name], RPH, will switch to linezolid 600 mg BID per her  recommendations.  Theresa Duty, PA-C Orthopedic Surgery [phone] 10/15/2021, 7:29 AM

## 2021-10-15 NOTE — TOC Progression Note (Signed)
Transition of Care [facility]) - Progression Note    Patient Details  Name: Tearra Ouk MRN: [id number] Date of Birth: [birth year]  [facility] CM/SW Contact  Geni Bers, RN Phone Number: 10/15/2021, 1:22 PM  Clinical Narrative:    Spoke with pt concerning HH. Pt will not need HH at present time.  Pt will follow up with Alice Rieger MD.   Expected Discharge Plan: Home w Home Health Services Barriers to Discharge: No Barriers Identified  Expected Discharge Plan and Services Expected Discharge Plan: Home w Home Health Services       Living arrangements for the past 2 months: Single Family Home                                       Social Determinants of Health (SDOH) Interventions    Readmission Risk Interventions Readmission Risk Prevention Plan 06/21/2021  Transportation Screening Complete  PCP or Specialist Appt within 5-7 Days Complete  Home Care Screening Complete  Medication Review (RN CM) Complete  Some recent data might be hidden

## 2021-10-16 ENCOUNTER — Telehealth (HOSPITAL_COMMUNITY): Payer: Self-pay | Admitting: Pharmacist

## 2021-10-16 ENCOUNTER — Other Ambulatory Visit (HOSPITAL_COMMUNITY): Payer: Self-pay

## 2021-10-16 LAB — BASIC METABOLIC PANEL
Anion gap: 12 (ref 5–15)
BUN: 42 mg/dL — ABNORMAL HIGH (ref 8–23)
CO2: 34 mmol/L — ABNORMAL HIGH (ref 22–32)
Calcium: 9.1 mg/dL (ref 8.9–10.3)
Chloride: 96 mmol/L — ABNORMAL LOW (ref 98–111)
Creatinine, Ser: 1.51 mg/dL — ABNORMAL HIGH (ref 0.44–1.00)
GFR, Estimated: 36 mL/min — ABNORMAL LOW (ref >60)
Glucose, Bld: 124 mg/dL — ABNORMAL HIGH (ref 70–99)
Potassium: 4.2 mmol/L (ref 3.5–5.1)
Sodium: 142 mmol/L (ref 135–145)

## 2021-10-16 LAB — CBC
HCT: 28.1 % — ABNORMAL LOW (ref 36.0–46.0)
Hemoglobin: 8 g/dL — ABNORMAL LOW (ref 12.0–15.0)
MCH: 28.8 pg (ref 26.0–34.0)
MCHC: 28.5 g/dL — ABNORMAL LOW (ref 30.0–36.0)
MCV: 101.1 fL — ABNORMAL HIGH (ref 80.0–100.0)
Platelets: 197 10*3/uL (ref 150–400)
RBC: 2.78 MIL/uL — ABNORMAL LOW (ref 3.87–5.11)
RDW: 16.6 % — ABNORMAL HIGH (ref 11.5–15.5)
WBC: 10.7 10*3/uL — ABNORMAL HIGH (ref 4.0–10.5)
nRBC: 0 % (ref 0.0–0.2)

## 2021-10-16 LAB — MAGNESIUM: Magnesium: 1.8 mg/dL (ref 1.7–2.4)

## 2021-10-16 LAB — PHOSPHORUS: Phosphorus: 3.9 mg/dL (ref 2.5–4.6)

## 2021-10-16 MED ORDER — METOPROLOL TARTRATE 25 MG PO TABS: 12.5000 mg | ORAL_TABLET | Freq: Two times a day (BID) | ORAL | 0 refills | Status: DC

## 2021-10-16 MED ORDER — LINEZOLID 600 MG PO TABS: 600.0000 mg | ORAL_TABLET | Freq: Two times a day (BID) | ORAL | 0 refills | Status: DC

## 2021-10-16 NOTE — Progress Notes (Signed)
Left a message for [name] to let her know that her antibiotic, linezolid (Zyvox) 600 mg BID for 4 weeks, will be delivered to her home tomorrow (12/15) via [employer] next day delivery. I also called [facility] to let them know not to dispense this medication for her since she will be getting it from the manufacturer.    [name], PharmD, BCPS, BCIDP Infectious Diseases Clinical Pharmacist Phone: [phone] 10/16/2021

## 2021-10-16 NOTE — Progress Notes (Signed)
Subjective: 6 Days Post-Op Procedure(s) (LRB): IRRIGATION AND DEBRIDEMENT HIP WITH WOUND VAC (Left) Patient reports pain as mild.   Patient seen in rounds by Dr. Lequita Halt. Patient is well, and has had no acute complaints or problems Plan is to go Home after hospital stay.  Objective: Vital signs in last 24 hours: Temp:  [97.8 F (36.6 C)-99.3 F (37.4 C)] 97.8 F (36.6 C) (12/14 0430) Pulse Rate:  [66-81] 73 (12/14 0430) Resp:  [18-20] 18 (12/14 0430) BP: (115-124)/(43-57) 120/57 (12/14 0430) SpO2:  [93 %-97 %] 93 % (12/14 0430) Weight:  [93 kg] 93 kg (12/14 0500)  Intake/Output from previous day:  Intake/Output Summary (Last 24 hours) at 10/16/2021 0723 Last data filed at 10/16/2021 0434 Gross per 24 hour  Intake --  Output 1875 ml  Net -1875 ml    Intake/Output this shift: No intake/output data recorded.  Labs: Recent Labs    10/14/21 0355 10/14/21 1352 10/16/21 0401  HGB 7.7* 8.2* 8.0*   Recent Labs    10/14/21 0355 10/14/21 1352 10/16/21 0401  WBC 8.4  --  10.7*  RBC 2.67*  --  2.78*  HCT 26.5* 27.8* 28.1*  PLT 238  --  197   Recent Labs    10/15/21 0406 10/16/21 0401  NA 141 142  K 4.2 4.2  CL 96* 96*  CO2 37* 34*  BUN 43* 42*  CREATININE 1.38* 1.51*  GLUCOSE 105* 124*  CALCIUM 8.8* 9.1   No results for input(s): LABPT, INR in the last 72 hours.  Exam: General - Patient is Alert and Oriented Extremity - Neurologically intact Neurovascular intact Sensation intact distally Dorsiflexion/Plantar flexion intact Dressing/Incision - Woundvac in place Motor Function - intact, moving foot and toes well on exam.   Past Medical History:  Diagnosis Date   Anemia    Arthritis    OA RIGHT KNEE WITH PAIN   Barrett esophagus    BiPAP (biphasic positive airway pressure)    Wears at night   Bradycardia 06/01/2015   CAD in native artery    a. NSTEMI 05/2015 s/p emergent CABG.   Chronic diastolic (congestive) heart failure (HCC)    Chronic  kidney disease, stage 3a (HCC)    Chronic respiratory failure (HCC)    Chronic respiratory failure with hypoxia and hypercapnia (HCC) 02/04/2010   Followed in Pulmonary clinic/ [facility]/ [facility]       - 02 dependent  since 07/02/10 >>  83% RA December 05, 2010       - ONO RA 08/05/12  :  Positive sat < 89 x 2:15m> repeat on 2lpm rec 08/12/2012  - 06/17/2013 reported desat with activity p Knee surgery > rec restart 2lpm with activity  - 06/27/2013   Walked 2lpm  x one lap @ 185 stopped due to sat 88% not sob , desat to 82% on RA just at th   COPD III spirometry if use FEV1/VC p saba  07/18/2010   Quit smoking May 2006       - PFT's  04/12/10 FEV1  1.21 (69%) ratio 77 and no change p B2,  DLC0 56%   VC 70%         - PFTs  08/08/2013 FEV1 1.21 (60%) ratio 86 and no change p B2 DLCO 79%  VC 72%  On symbicort 160 2bid  - PFT's  02/08/2018  FEV1 0.70 (40 % ) ratio 56 if use FEV1/VC  p 38 % improvement from saba p symb 160  prior to study with DLCO  78 % corrects to 147  % for alv volume   - 02/08/2018   Cough variant asthma 02/26/2011   Followed in Pulmonary clinic/ [facility]/ [facility]  - PFT's  06/04/15  FEV1 1.20 (67 % ) ratio 83  p 6 % improvement from saba with DLCO  80 % corrects to 132 % for alv volume      - Clinical dx based on response to symbicort       FENO 09/16/2016  =   96 on symbicort 160 2bid > added singulair  Allergy profile 09/16/2016 >  Eos 0.5 /  IgE  78 neg RAST  -  Referred to rehab 04/29/2017 > completed   Depression    Dysrhythmia    Afib   Essential hypertension 04/20/2007   Qualifier: Diagnosis of  By: Paulina Fusi, RN, Daine Gravel    GERD (gastroesophageal reflux disease)    History of ARDS 2006   History of home oxygen therapy    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 small intracranial hemorrhage in setting of HTN.   Mild carotid artery disease ([place])    a. Duplex 1-39% bilaterally in 2016.   Morbid  (severe) obesity due to excess calories ([place]) 04/22/2015   pfts with erv 14% 06/04/15  And 33% 02/08/2018    NSTEMI (non-ST elevated myocardial infarction) ([place]) 05/31/2015   Pneumococcal pneumonia ([place]) 2006   HOSPITALIZED AND DEVELOPED ARDS   Psoriasis    Psoriatic arthritis ([place])    PULMONARY FIBROSIS ILD POST INFLAMMATORY CHRONIC 07/18/2010   Followed as Primary Care Patient/ [facility]/ [facility]  -s/p ARDS 2006 with bacteremic S  Pna       - CT chest 07/03/10 Nonspecific PF mostly upper lobes       - CT chest 12/03/10 acute gg changes and effusions c/w chf - PFT's  02/08/2018  FVC 0.64 (28 %)   with DLCO  78 % corrects to 147 % for alv volume      Rhematoid arthritis    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   SOB (shortness of breath) on exertion     Assessment/Plan: 6 Days Post-Op Procedure(s) (LRB): IRRIGATION AND DEBRIDEMENT HIP WITH WOUND VAC (Left) Principal Problem:   Hematoma of left hip Active Problems:   COPD III spirometry if use FEV1/VC p saba    Chronic respiratory failure with hypoxia and hypercapnia (HCC)   Atrial fibrillation (HCC)   Chronic heart failure with preserved ejection fraction (HCC)   Stage 3b chronic kidney disease (HCC)   CAD in native artery   ILD (interstitial lung disease) (HCC)   Periprosthetic fracture of femur following total replacement of hip   Fluid collection at surgical site   Periprosthetic fracture around internal prosthetic left hip joint ([place])  Estimated body mass index is 40.04 kg/m as calculated from the following:   Height as of this encounter: 5' (1.524 m).   Weight as of this encounter: 93 kg.  TDWB to the left leg.  Discharge to home today, tolerated PO abx yesterday. Linezolid 600 mg BID at discharge, medication sent to pharmacy. Follow-up in the office in 1 week for woundvac removal.  Theresa Duty, PA-C Orthopedic Surgery [phone] 10/16/2021, 7:23 AM

## 2021-10-16 NOTE — Plan of Care (Signed)
°  Problem: Clinical Measurements: Goal: Respiratory complications will improve Outcome: Adequate for Discharge   Problem: Activity: Goal: Risk for activity intolerance will decrease Outcome: Adequate for Discharge   Problem: Nutrition: Goal: Adequate nutrition will be maintained Outcome: Adequate for Discharge   Problem: Pain Managment: Goal: General experience of comfort will improve Outcome: Adequate for Discharge   Problem: Safety: Goal: Ability to remain free from injury will improve Outcome: Adequate for Discharge   Problem: Skin Integrity: Goal: Risk for impaired skin integrity will decrease Outcome: Adequate for Discharge

## 2021-10-16 NOTE — Discharge Summary (Signed)
Physician Discharge Summary  [name] [id number] DOB: May 05, 1946 DOA: 10/06/2021  PCP: Amber Bliss, Rayford Halsted, MD  Admit date: 10/06/2021 Discharge date: 10/16/2021  Admitted From: home Discharge disposition: home   Recommendations for Outpatient Follow-Up:   zyvox (arrange to be delivered from [place] on 12/15 for free) 1 week for woundvac removal via ortho BMP 1 week   Discharge Diagnosis:   Principal Problem:   Hematoma of left hip Active Problems:   COPD III spirometry if use FEV1/VC p saba    Chronic respiratory failure with hypoxia and hypercapnia (HCC)   Atrial fibrillation (HCC)   Chronic heart failure with preserved ejection fraction (HCC)   Stage 3b chronic kidney disease ([place])   CAD in native artery   ILD (interstitial lung disease) ([place])   Periprosthetic fracture of femur following total replacement of hip   Fluid collection at surgical site   Periprosthetic fracture around internal prosthetic left hip joint ([place])    Discharge Condition: Improved.  Diet recommendation: Low sodium, heart healthy  Wound care: None.  Code status: DNR   History of Present Illness:   [name] is a very pleasant 75 y.o. female with medical history significant for CAD, HFpEF, atrial fibrillation on Eliquis, CKD IIIb, ILD, COPD, OSA, chronic hypoxic and hypercarbic respiratory failure, RA, and osteoarthritis status post left total hip arthroplasty on 09/16/2021 who presented to [facility] on 10/05/2021 with increasing left hip pain.  Patient reports that she was ambulating and pain was well controlled at time of her hospital discharge on 09/23/2021.  She developed insidiously worsening pain at home following the discharge and was becoming less and less mobile due to this.  Her daughter was helping her back at home after the surgery and reports seeing only a tiny amount of yellow drainage from the superior aspect of the incision when she  change the bandages prior to presenting to [facility].  Patient denies any fevers or chills, and denies any recent increase in chronic cough or dyspnea.   [facility] Course: Upon arrival to the ED, patient is found to have a temperature of 100.1 F with slightly elevated heart rate.  She had a CT of the left hip concerning for periprosthetic intertrochanteric left femur fracture and periprosthetic fluid collections.  She had a CTA chest is negative for PE and notable for chronic appearing interstitial lung disease.  WBC was 13,500, hemoglobin 8.6, creatinine 1.60, procalcitonin 0.27, and influenza and COVID PCR were negative.  She had an elevated proBNP and mild elevation in troponin which decreased on repeat.  She was given IV vancomycin and Zosn and IV Lasix.     There was discussion between orthopedic surgery at [facility] and surgeon covering for Dr. Wynelle Link who performed the patient's THA, and patient was transferred to medical service at [facility] where orthopedic surgery will see her in consultation.    Hospital Course by Problem:      Left hip pain secondary to periprosthetic hip fracture: Patient s/p left total hip arthroplasty on 09/16/2021. She now complains of increasing left hip pain and drainage. CT scan done in an outside hospital concerning for periprosthetic fracture and fluid collections.  Patient seen by Ortho and recommended nonoperative treatment with TTWB initially. Continue IV cefepime.  Given increased drainage and fluid pocket seen on CT scan,  Ortho recommended it is best to proceed with an irrigation and debridement of the wound on 10/10/21.   She underwent irrigation and  debridement of left hip with wound VAC on 12/8. Continue current management, intra operative cultures growing staph aureus Cultures finalized showing MRSA sensitive to Bactrim, linezolid.  Since the infection was localized to subcutaneous tissue, will treat with p.o. antibiotics. Patient can be  discharged on linezolid 600 mg twice daily   Chronic hypoxic and hypercapnic respiratory failure sec. to COPD, OSA, ILD: Chest x-ray on 10/07/2021 showed cardiomegaly with interstitial edema and pleural effusion.   Continue torsemide 20 mg twice a day. Continue nebulizer,  supplemental oxygen and BiPAP nightly.   Chronic atrial fibrillation: Heart rate controlled.  Continue Cardizem and Tikosyn. Continue Eliquis.   Chronic diastolic heart failure: Stable. Last LVEF 65 to 70% in 10/22 Continue torsemide.   AKI on CKD stage IIIb: - on torsemide. Serum creatinine at baseline.   Rheumatoid arthritis: Continue leflunomide.   Anemia of chronic disease: Hemoglobin currently stable.   History of CAD and CABG: Continue Lipitor   Medical Consultants:    ortho  Discharge Exam:   Vitals:   10/16/21 0430 10/16/21 0857  BP: (!) 120/57   Pulse: 73   Resp: 18   Temp: 97.8 F (36.6 C)   SpO2: 93% 94%   Vitals:   10/15/21 2100 10/16/21 0430 10/16/21 0500 10/16/21 0857  BP:  (!) 120/57    Pulse:  73    Resp: 18 18    Temp:  97.8 F (36.6 C)    TempSrc:  Oral    SpO2:  93%  94%  Weight:   93 kg   Height:        General exam: Appears calm and comfortable.     The results of significant diagnostics from this hospitalization (including imaging, microbiology, ancillary and laboratory) are listed below for reference.     Procedures and Diagnostic Studies:   DG Chest 1 View  Result Date: 10/07/2021 CLINICAL DATA:  Hypoxia EXAM: CHEST  1 VIEW COMPARISON:  06/19/2021, CT 12/27/2020 FINDINGS: Post sternotomy changes. Cardiomegaly with vascular congestion and diffuse interstitial opacity likely edema. Probable small right-sided effusion. IMPRESSION: Cardiomegaly with vascular congestion and interstitial edema with probable small right effusion Electronically Signed   By: Donavan Foil M.D.   On: 10/07/2021 16:32   DG HIP UNILAT WITH PELVIS MIN 4 VIEWS LEFT  Result Date:  10/07/2021 CLINICAL DATA:  Recent left hip arthroplasty, pain EXAM: DG HIP (WITH OR WITHOUT PELVIS) 4+V LEFT COMPARISON:  09/16/2021 FINDINGS: There is previous left hip arthroplasty. There is evidence of recent fracture in the medial cortex of proximal shaft of left femur. There is faint radiolucent line in the lesser trochanter. There is deformity in the greater trochanter. As far as seen, there is no dislocation in the prosthetic left hip. IMPRESSION: Status post left hip arthroplasty. There is new fracture in the medial aspect of proximal shaft of left femur and possibly in the lesser trochanter. There are calcific densities in the region of greater trochanter which may be related to recent surgery or possibly recent fracture. Electronically Signed   By: Elmer Picker M.D.   On: 10/07/2021 09:21     Labs:   Basic Metabolic Panel: Recent Labs  Lab 10/10/21 0413 10/11/21 0435 10/12/21 0430 10/13/21 0414 10/14/21 0355 10/15/21 0406 10/16/21 0401  NA 143 144 143 142 141 141 142  K 3.3* 4.1 3.6 3.9 3.4* 4.2 4.2  CL 93* 96* 92* 93* 93* 96* 96*  CO2 41* 38* 42* 41* 39* 37* 34*  GLUCOSE 113* 167* 117* 114*  109* 105* 124*  BUN 28* 36* 38* 43* 39* 43* 42*  CREATININE 1.09* 1.37* 1.35* 1.41* 1.37* 1.38* 1.51*  CALCIUM 9.2 8.7* 8.8* 8.7* 8.7* 8.8* 9.1  MG 1.7 1.8 1.6* 1.8  --   --  1.8  PHOS 2.5  --  3.0 3.4  --   --  3.9   GFR Estimated Creatinine Clearance: 33.3 mL/min (A) (by C-G formula based on SCr of 1.51 mg/dL (H)). Liver Function Tests: No results for input(s): AST, ALT, ALKPHOS, BILITOT, PROT, ALBUMIN in the last 168 hours. No results for input(s): LIPASE, AMYLASE in the last 168 hours. No results for input(s): AMMONIA in the last 168 hours. Coagulation profile No results for input(s): INR, PROTIME in the last 168 hours.  CBC: Recent Labs  Lab 10/10/21 0413 10/11/21 0435 10/12/21 0430 10/14/21 0355 10/14/21 1352 10/16/21 0401  WBC 9.3 8.3 8.8 8.4  --  10.7*  HGB  8.8* 8.1* 8.2* 7.7* 8.2* 8.0*  HCT 29.9* 27.9* 28.0* 26.5* 27.8* 28.1*  MCV 99.3 99.6 97.9 99.3  --  101.1*  PLT 292 293 276 238  --  197   Cardiac Enzymes: No results for input(s): CKTOTAL, CKMB, CKMBINDEX, TROPONINI in the last 168 hours. BNP: Invalid input(s): POCBNP CBG: No results for input(s): GLUCAP in the last 168 hours. D-Dimer No results for input(s): DDIMER in the last 72 hours. Hgb A1c No results for input(s): HGBA1C in the last 72 hours. Lipid Profile No results for input(s): CHOL, HDL, LDLCALC, TRIG, CHOLHDL, LDLDIRECT in the last 72 hours. Thyroid function studies No results for input(s): TSH, T4TOTAL, T3FREE, THYROIDAB in the last 72 hours.  Invalid input(s): FREET3 Anemia work up No results for input(s): VITAMINB12, FOLATE, FERRITIN, TIBC, IRON, RETICCTPCT in the last 72 hours. Microbiology Recent Results (from the past 240 hour(s))  Fungus Culture With Stain     Status: None (Preliminary result)   Collection Time: 10/10/21  6:31 PM   Specimen: Synovial, Left Hip; Body Fluid  Result Value Ref Range Status   Fungus Stain Final report  Final    Comment: (NOTE) Performed At: [facility] [place], [place] [id number] Rush Farmer MD [id number]    Fungus (Mycology) Culture PENDING  Incomplete   Fungal Source SYNOVIAL  Final    Comment: LEFT HIP Performed at [facility] [address] [place], [place] [address]   Aerobic/Anaerobic Culture w Gram Stain (surgical/deep wound)     Status: None   Collection Time: 10/10/21  6:31 PM   Specimen: Synovial, Left Hip; Body Fluid  Result Value Ref Range Status   Specimen Description   Final    SYNOVIAL LEFT HIP Performed at [facility] [address] [place], [place] [address]    Special Requests   Final    NONE Performed at [facility] [place] [address] [place], [place] [address]    Gram Stain   Final    MODERATE WBC  PRESENT,BOTH PMN AND MONONUCLEAR MODERATE GRAM POSITIVE COCCI    Culture   Final    RARE METHICILLIN RESISTANT STAPHYLOCOCCUS AUREUS NO ANAEROBES ISOLATED Performed at [facility], [place] [address], [place],  [address]    Report Status 10/15/2021 FINAL  Final   Organism ID, Bacteria METHICILLIN RESISTANT STAPHYLOCOCCUS AUREUS  Final      Susceptibility   Methicillin resistant staphylococcus aureus - MIC*    CIPROFLOXACIN >=8 RESISTANT Resistant     ERYTHROMYCIN >=8 RESISTANT Resistant  GENTAMICIN <=0.5 SENSITIVE Sensitive     OXACILLIN >=4 RESISTANT Resistant     TETRACYCLINE >=16 RESISTANT Resistant     VANCOMYCIN 1 SENSITIVE Sensitive     TRIMETH/SULFA <=10 SENSITIVE Sensitive     CLINDAMYCIN >=8 RESISTANT Resistant     RIFAMPIN <=0.5 SENSITIVE Sensitive     Inducible Clindamycin NEGATIVE Sensitive     * RARE METHICILLIN RESISTANT STAPHYLOCOCCUS AUREUS  Acid Fast Smear (AFB)     Status: None   Collection Time: 10/10/21  6:31 PM   Specimen: Synovial, Left Hip; Body Fluid  Result Value Ref Range Status   AFB Specimen Processing Comment  Final    Comment: Tissue Grinding and Digestion/Decontamination   Acid Fast Smear Negative  Final    Comment: (NOTE) Performed At: [facility] [address] [place], [place] [id number] Jolene Schimke MD OF:[id number]    Source (AFB) SYNOVIAL  Final    Comment: LEFT HIP Performed at [facility], [address] [place], [place] [address]   Fungus Culture Result     Status: None   Collection Time: 10/10/21  6:31 PM  Result Value Ref Range Status   Result 1 Comment  Final    Comment: (NOTE) KOH/Calcofluor preparation:  no fungus observed. Performed At: [facility] [address] [place], [place] [id number] Jolene Schimke MD [id number]   Resp Panel by RT-PCR (Flu A&B, Covid) Nasopharyngeal Swab     Status: None   Collection Time: 10/12/21  1:40 PM   Specimen: Nasopharyngeal Swab;  Nasopharyngeal(NP) swabs in vial transport medium  Result Value Ref Range Status   SARS Coronavirus 2 by RT PCR NEGATIVE NEGATIVE Final    Comment: (NOTE) SARS-CoV-2 target nucleic acids are NOT DETECTED.  The SARS-CoV-2 RNA is generally detectable in upper respiratory specimens during the acute phase of infection. The lowest concentration of SARS-CoV-2 viral copies this assay can detect is 138 copies/mL. A negative result does not preclude SARS-Cov-2 infection and should not be used as the sole basis for treatment or other patient management decisions. A negative result may occur with  improper specimen collection/handling, submission of specimen other than nasopharyngeal swab, presence of viral mutation(s) within the areas targeted by this assay, and inadequate number of viral copies(<138 copies/mL). A negative result must be combined with clinical observations, patient history, and epidemiological information. The expected result is Negative.  Fact Sheet for Patients:  BloggerCourse.com  Fact Sheet for Healthcare Providers:  SeriousBroker.it  This test is no t yet approved or cleared by the [place] FDA and  has been authorized for detection and/or diagnosis of SARS-CoV-2 by FDA under an Emergency Use Authorization (EUA). This EUA will remain  in effect (meaning this test can be used) for the duration of the COVID-19 declaration under Section 564(b)(1) of the Act, 21 U.S.C.section 360bbb-3(b)(1), unless the authorization is terminated  or revoked sooner.       Influenza A by PCR NEGATIVE NEGATIVE Final   Influenza B by PCR NEGATIVE NEGATIVE Final    Comment: (NOTE) The Xpert Xpress SARS-CoV-2/FLU/RSV plus assay is intended as an aid in the diagnosis of influenza from Nasopharyngeal swab specimens and should not be used as a sole basis for treatment. Nasal washings and aspirates are unacceptable for Xpert Xpress  SARS-CoV-2/FLU/RSV testing.  Fact Sheet for Patients: BloggerCourse.com  Fact Sheet for Healthcare Providers: SeriousBroker.it  This test is not yet approved or cleared by the [place] FDA and has been authorized for detection and/or diagnosis of  SARS-CoV-2 by FDA under an Emergency Use Authorization (EUA). This EUA will remain in effect (meaning this test can be used) for the duration of the COVID-19 declaration under Section 564(b)(1) of the Act, 21 U.S.C. section 360bbb-3(b)(1), unless the authorization is terminated or revoked.  Performed at [facility], [address] [place], [place] [address]      Discharge Instructions:   Discharge Instructions     Call MD / Call 911   Complete by: As directed    If you experience chest pain or shortness of breath, CALL 911 and be transported to the hospital emergency room.  If you develope a fever above 101 F, pus (white drainage) or increased drainage or redness at the wound, or calf pain, call your surgeon's office.   Constipation Prevention   Complete by: As directed    Drink plenty of fluids.  Prune juice may be helpful.  You may use a stool softener, such as Colace (over the counter) 100 mg twice a day.  Use MiraLax (over the counter) for constipation as needed.   Diet - low sodium heart healthy   Complete by: As directed    Discharge wound care:   Complete by: As directed    Wound vac   Do not sit on low chairs, stoools or toilet seats, as it may be difficult to get up from low surfaces   Complete by: As directed    Driving restrictions   Complete by: As directed    No driving for two weeks   Increase activity slowly   Complete by: As directed    Touch down weight bearing   Complete by: As directed       Allergies as of 10/16/2021       Reactions   Gabapentin Other (See Comments)   Dizziness, lighthead        Medication List     STOP  taking these medications    cyclobenzaprine 5 MG tablet Commonly known as: FLEXERIL   traMADol 50 MG tablet Commonly known as: ULTRAM       TAKE these medications    acetaminophen 650 MG CR tablet Commonly known as: TYLENOL Take 650 mg by mouth 2 (two) times daily as needed for pain.   albuterol 108 (90 Base) MCG/ACT inhaler Commonly known as: VENTOLIN HFA Inhale 2 puffs into the lungs every 4 (four) hours as needed (shortness of breath, if you can't catch your breath).   apixaban 5 MG Tabs tablet Commonly known as: Eliquis Take 1 tablet (5 mg total) by mouth 2 (two) times daily.   atorvastatin 40 MG tablet Commonly known as: LIPITOR Take 40 mg by mouth daily.   budesonide 0.25 MG/2ML nebulizer solution Commonly known as: Pulmicort One twice daily What changed:  how much to take how to take this when to take this additional instructions   CALCIUM PO Take 1 tablet by mouth daily.   diltiazem 240 MG 24 hr capsule Commonly known as: CARDIZEM CD Take 1 capsule (240 mg total) by mouth daily.   dofetilide 250 MCG capsule Commonly known as: TIKOSYN Take 1 capsule by mouth twice daily   DULoxetine 30 MG capsule Commonly known as: CYMBALTA Take 30 mg by mouth daily.   ferrous sulfate 325 (65 FE) MG tablet Take 1 tablet (325 mg total) by mouth daily with breakfast. What changed: when to take this   formoterol 20 MCG/2ML nebulizer solution Commonly known as: PERFOROMIST Take 2 mLs (20 mcg total)  by nebulization 2 (two) times daily. Use in nebulizer twice daily perfectly regularly   golimumab 50 MG/4ML Soln injection Commonly known as: SIMPONI ARIA Inject 50 mg into the vein every 8 (eight) weeks.   hydrocortisone 2.5 % cream Apply 1 application topically daily as needed for itching.   hydroxypropyl methylcellulose / hypromellose 2.5 % ophthalmic solution Commonly known as: ISOPTO TEARS / GONIOVISC Place 1 drop into both eyes 3 (three) times daily as needed  for dry eyes.   Incruse Ellipta 62.5 MCG/ACT Aepb Generic drug: umeclidinium bromide Inhale 1 puff into the lungs daily.   ketoconazole 2 % cream Commonly known as: NIZORAL Apply 1 application topically daily as needed for irritation.   leflunomide 20 MG tablet Commonly known as: Arava Take 1 tablet (20 mg total) by mouth daily.   levothyroxine 125 MCG tablet Commonly known as: SYNTHROID TAKE 1 TABLET BY MOUTH EVERY DAY What changed: when to take this   linezolid 600 MG tablet Commonly known as: ZYVOX Take 1 tablet (600 mg total) by mouth every 12 (twelve) hours.   losartan 50 MG tablet Commonly known as: COZAAR Take 1 tablet (50 mg total) by mouth daily.   Magnesium Oxide 400 MG Caps Take 1 capsule (400 mg total) by mouth daily.   metoprolol tartrate 25 MG tablet Commonly known as: LOPRESSOR Take 0.5 tablets (12.5 mg total) by mouth 2 (two) times daily.   montelukast 10 MG tablet Commonly known as: SINGULAIR Take 10 mg by mouth daily.   nitroGLYCERIN 0.4 MG SL tablet Commonly known as: NITROSTAT Place 1 tablet (0.4 mg total) under the tongue every 5 (five) minutes as needed for chest pain.   nystatin powder Commonly known as: MYCOSTATIN/NYSTOP APPLY 1 APPLICATION        TOPICALLY 3 TIMES A DAY What changed: See the new instructions.   OXYGEN Inhale 2 L into the lungs continuous.   pantoprazole 40 MG tablet Commonly known as: PROTONIX Take 1 tablet (40 mg total) by mouth daily before breakfast.   pregabalin 50 MG capsule Commonly known as: LYRICA Take 50 mg by mouth at bedtime.   torsemide 20 MG tablet Commonly known as: DEMADEX Take 1 tablet (20 mg total) by mouth 2 (two) times daily.   triamcinolone cream 0.1 % Commonly known as: KENALOG Apply 1 application topically 2 (two) times daily as needed (for psoriasis).   vitamin B-12 1000 MCG tablet Commonly known as: CYANOCOBALAMIN Take 1,000 mcg by mouth daily.   Vitamin D3 50 MCG (2000 UT)  Tabs Take 2,000 Units by mouth daily.               Discharge Care Instructions  (From admission, onward)           Start     Ordered   10/16/21 0000  Touch down weight bearing        10/16/21 0722   10/16/21 0000  Discharge wound care:       Comments: Wound vac   10/16/21 [id number]            Follow-up Information     Gaynelle Arabian, MD Follow up in 1 week(s).   Specialty: Orthopedic Surgery Contact information: [address] [place] [address] [id number]                  Time coordinating discharge: 35 min  Signed:  Geradine Girt DO  [facility] 10/16/2021, 4:57 PM

## 2021-10-16 NOTE — Discharge Instructions (Signed)
Ollen Gross, MD [facility] [address], [address] [place], [place] [address] [phone]  POSTOPERATIVE DIRECTIONS   Leave woundvac in place until your first follow-up appointment in the office (1 week following discharge) Take one tablet of Linezolid two times a day until you are told you can discontinue the antibiotic.  Touch down weight bearing to the left leg.  DIET You may resume your previous home diet once your are discharged from the hospital.  TED HOSE STOCKINGS Wear the elastic stockings on both legs for three weeks following surgery during the day. You may remove them at night while sleeping.   POSTOPERATIVE CONSTIPATION PROTOCOL Constipation - defined medically as fewer than three stools per week and severe constipation as less than one stool per week.  One of the most common issues patients have following surgery is constipation.  Even if you have a regular bowel pattern at home, your normal regimen is likely to be disrupted due to multiple reasons following surgery.  Combination of anesthesia, postoperative narcotics, change in appetite and fluid intake all can affect your bowels.  In order to avoid complications following surgery, here are some recommendations in order to help you during your recovery period.  Colace (docusate) - Pick up an over-the-counter form of Colace or another stool softener and take twice a day as long as you are requiring postoperative pain medications.  Take with a full glass of water daily.  If you experience loose stools or diarrhea, hold the colace until you stool forms back up.  If your symptoms do not get better within 1 week or if they get worse, check with your doctor. Dulcolax (bisacodyl) - Pick up over-the-counter and take as directed by the product packaging as needed to assist with the movement of your bowels.  Take with a full glass of water.  Use this product as needed if not relieved by  Colace only.  MiraLax (polyethylene glycol) - Pick up over-the-counter to have on hand.  MiraLax is a solution that will increase the amount of water in your bowels to assist with bowel movements.  Take as directed and can mix with a glass of water, juice, soda, coffee, or tea.  Take if you go more than two days without a movement.Do not use MiraLax more than once per day. Call your doctor if you are still constipated or irregular after using this medication for 7 days in a row.  If you continue to have problems with postoperative constipation, please contact the office for further assistance and recommendations.  If you experience "the worst abdominal pain ever" or develop nausea or vomiting, please contact the office immediatly for further recommendations for treatment.  ITCHING  If you experience itching with your medications, try taking only a single pain pill, or even half a pain pill at a time.  You can also use Benadryl over the counter for itching or also to help with sleep.   MEDICATIONS See your medication summary on the After Visit Summary that the nursing staff will review with you prior to discharge.  You may have some home medications which will be placed on hold until you complete the course of blood thinner medication.  It is important for you to complete the blood thinner medication as prescribed by your surgeon.  Continue your approved medications as instructed at time of discharge.  PRECAUTIONS If you experience chest pain or shortness of breath - call 911 immediately for transfer to the hospital  emergency department.  If you develop a fever greater that 101 F, purulent drainage from wound, increased redness or drainage from wound, foul odor from the wound/dressing, or calf pain - CONTACT YOUR SURGEON.                                                   FOLLOW-UP APPOINTMENTS Make sure you keep all of your appointments after your operation with your surgeon and caregivers. You  should call the office at the above phone number and make an appointment for approximately two weeks after the date of your surgery or on the date instructed by your surgeon outlined in the "After Visit Summary".  POST-OPERATIVE OPIOID TAPER INSTRUCTIONS: It is important to wean off of your opioid medication as soon as possible. If you do not need pain medication after your surgery it is ok to stop day one. Opioids include: Codeine, Hydrocodone(Norco, Vicodin), Oxycodone(Percocet, oxycontin) and hydromorphone amongst others.  Long term and even short term use of opiods can cause: Increased pain response Dependence Constipation Depression Respiratory depression And more.  Withdrawal symptoms can include Flu like symptoms Nausea, vomiting And more Techniques to manage these symptoms Hydrate well Eat regular healthy meals Stay active Use relaxation techniques(deep breathing, meditating, yoga) Do Not substitute Alcohol to help with tapering If you have been on opioids for less than two weeks and do not have pain than it is ok to stop all together.  Plan to wean off of opioids This plan should start within one week post op of your joint replacement. Maintain the same interval or time between taking each dose and first decrease the dose.  Cut the total daily intake of opioids by one tablet each day Next start to increase the time between doses. The last dose that should be eliminated is the evening dose.   MAKE SURE YOU:  Understand these instructions.  Get help right away if you are not doing well or get worse.    Pick up stool softner and laxative for home use following surgery while on pain medications. Do not submerge incision under water. Please use good hand washing techniques while changing dressing each day. May shower starting three days after surgery. Please use a clean towel to pat the incision dry following showers. Continue to use ice for pain and swelling after  surgery. Do not use any lotions or creams on the incision until instructed by your surgeon.

## 2021-10-16 NOTE — TOC Progression Note (Signed)
Transition of Care [facility]) - Progression Note    Patient Details  Name: [name] MRN: [id number] Date of Birth: [birth year]  Transition [facility]) CM/SW Contact  Amber Bers, RN Phone Number: 10/16/2021, 10:36 AM  Clinical Narrative:    [name] was called, pt and RN are aware.    Expected Discharge Plan: Home w Home Health Services Barriers to Discharge: No Barriers Identified  Expected Discharge Plan and Services Expected Discharge Plan: Home w Home Health Services       Living arrangements for the past 2 months: Single Family Home Expected Discharge Date: 10/16/21                                     Social Determinants of Health (SDOH) Interventions    Readmission Risk Interventions Readmission Risk Prevention Plan 06/21/2021  Transportation Screening Complete  PCP or Specialist Appt within 5-7 Days Complete  Home Care Screening Complete  Medication Review (RN CM) Complete  Some recent data might be hidden

## 2021-10-16 NOTE — TOC Benefit Eligibility Note (Signed)
Patient Advocate Encounter  Patient is approved through [employer] Patient Assistance Program for Zyvox 600 mg tablets.   Medication will be sent by [employer] to her home to be delivered on 10/17/2021   Roland Earl, CPhT Pharmacy Patient Advocate Specialist [facility] Patient Advocate Team Direct Number: [phone]  Fax: [phone]

## 2021-10-18 ENCOUNTER — Telehealth: Payer: Self-pay | Admitting: *Deleted

## 2021-10-18 NOTE — Chronic Care Management (AMB) (Signed)
°  Care Management   Note  10/18/2021 Name: [name] MRN: [id number] DOB: 06/11/1946  Summar [name] is a 75 y.o. year old female who is a primary care patient of [name], Amber Patricia, MD and is actively engaged with the care management team. I reached out to [name] by phone today to assist with scheduling an initial visit with the RN Case Manager  Follow up plan: Telephone appointment with care management team member scheduled for:11/11/21  [facility] Guide, Embedded Care Coordination [facility]   Care Management  Direct Dial: [phone]

## 2021-10-18 NOTE — Chronic Care Management (AMB) (Signed)
°  Care Management   Note  10/18/2021 Name: [name] MRN: [id number] DOB: 09-18-1946  [name] is a 76 y.o. year old female who is a primary care patient of [name], [name], MD and is actively engaged with the care management team. I reached out to [name] by phone today to assist with scheduling an initial visit with the RN Case Manager  Follow up plan: Unsuccessful telephone outreach attempt made. A HIPAA compliant phone message was left for the patient providing contact information and requesting a return call. The care management team will reach out to the patient again over the next 7 days. If patient returns call to provider office, please advise to call Embedded Care Management Care Guide Misty Stanley at [phone].  Gwenevere Ghazi  Care Guide, Embedded Care Coordination [facility] Management  Direct Dial: [phone]

## 2021-10-20 ENCOUNTER — Emergency Department (HOSPITAL_COMMUNITY): Payer: Medicare Other

## 2021-10-20 ENCOUNTER — Inpatient Hospital Stay (HOSPITAL_COMMUNITY)
Admission: EM | Admit: 2021-10-20 | Discharge: 2021-10-25 | DRG: 177 | Disposition: A | Discharge status: Home / Self Care | Payer: Medicare Other | Attending: Internal Medicine | Admitting: Internal Medicine

## 2021-10-20 ENCOUNTER — Other Ambulatory Visit: Payer: Self-pay

## 2021-10-20 ENCOUNTER — Encounter (HOSPITAL_COMMUNITY): Payer: Self-pay

## 2021-10-20 DIAGNOSIS — J9622 Acute and chronic respiratory failure with hypercapnia: Secondary | ICD-10-CM | POA: Diagnosis present

## 2021-10-20 DIAGNOSIS — I482 Chronic atrial fibrillation, unspecified: Secondary | ICD-10-CM | POA: Diagnosis present

## 2021-10-20 DIAGNOSIS — I4891 Unspecified atrial fibrillation: Secondary | ICD-10-CM | POA: Diagnosis not present

## 2021-10-20 DIAGNOSIS — Z8249 Family history of ischemic heart disease and other diseases of the circulatory system: Secondary | ICD-10-CM

## 2021-10-20 DIAGNOSIS — U071 COVID-19: Secondary | ICD-10-CM | POA: Diagnosis present

## 2021-10-20 DIAGNOSIS — J1282 Pneumonia due to coronavirus disease 2019: Secondary | ICD-10-CM | POA: Diagnosis present

## 2021-10-20 DIAGNOSIS — E039 Hypothyroidism, unspecified: Secondary | ICD-10-CM | POA: Diagnosis present

## 2021-10-20 DIAGNOSIS — J9621 Acute and chronic respiratory failure with hypoxia: Secondary | ICD-10-CM

## 2021-10-20 DIAGNOSIS — I5033 Acute on chronic diastolic (congestive) heart failure: Secondary | ICD-10-CM

## 2021-10-20 DIAGNOSIS — L4050 Arthropathic psoriasis, unspecified: Secondary | ICD-10-CM | POA: Diagnosis present

## 2021-10-20 DIAGNOSIS — F32A Depression, unspecified: Secondary | ICD-10-CM | POA: Diagnosis present

## 2021-10-20 DIAGNOSIS — Z96642 Presence of left artificial hip joint: Secondary | ICD-10-CM | POA: Diagnosis present

## 2021-10-20 DIAGNOSIS — I4821 Permanent atrial fibrillation: Secondary | ICD-10-CM | POA: Diagnosis present

## 2021-10-20 DIAGNOSIS — Z743 Need for continuous supervision: Secondary | ICD-10-CM | POA: Diagnosis not present

## 2021-10-20 DIAGNOSIS — Z951 Presence of aortocoronary bypass graft: Secondary | ICD-10-CM

## 2021-10-20 DIAGNOSIS — Z6841 Body Mass Index (BMI) 40.0 and over, adult: Secondary | ICD-10-CM

## 2021-10-20 DIAGNOSIS — J811 Chronic pulmonary edema: Secondary | ICD-10-CM | POA: Diagnosis not present

## 2021-10-20 DIAGNOSIS — R000 Tachycardia, unspecified: Secondary | ICD-10-CM | POA: Diagnosis not present

## 2021-10-20 DIAGNOSIS — B9562 Methicillin resistant Staphylococcus aureus infection as the cause of diseases classified elsewhere: Secondary | ICD-10-CM | POA: Diagnosis present

## 2021-10-20 DIAGNOSIS — I517 Cardiomegaly: Secondary | ICD-10-CM | POA: Diagnosis not present

## 2021-10-20 DIAGNOSIS — D631 Anemia in chronic kidney disease: Secondary | ICD-10-CM | POA: Diagnosis present

## 2021-10-20 DIAGNOSIS — J441 Chronic obstructive pulmonary disease with (acute) exacerbation: Secondary | ICD-10-CM | POA: Diagnosis present

## 2021-10-20 DIAGNOSIS — J440 Chronic obstructive pulmonary disease with acute lower respiratory infection: Secondary | ICD-10-CM | POA: Diagnosis present

## 2021-10-20 DIAGNOSIS — I2510 Atherosclerotic heart disease of native coronary artery without angina pectoris: Secondary | ICD-10-CM

## 2021-10-20 DIAGNOSIS — K2270 Barrett's esophagus without dysplasia: Secondary | ICD-10-CM | POA: Diagnosis present

## 2021-10-20 DIAGNOSIS — G4733 Obstructive sleep apnea (adult) (pediatric): Secondary | ICD-10-CM | POA: Diagnosis present

## 2021-10-20 DIAGNOSIS — J9600 Acute respiratory failure, unspecified whether with hypoxia or hypercapnia: Secondary | ICD-10-CM | POA: Diagnosis not present

## 2021-10-20 DIAGNOSIS — D649 Anemia, unspecified: Secondary | ICD-10-CM | POA: Diagnosis present

## 2021-10-20 DIAGNOSIS — I130 Hypertensive heart and chronic kidney disease with heart failure and stage 1 through stage 4 chronic kidney disease, or unspecified chronic kidney disease: Secondary | ICD-10-CM | POA: Diagnosis present

## 2021-10-20 DIAGNOSIS — Z66 Do not resuscitate: Secondary | ICD-10-CM | POA: Diagnosis present

## 2021-10-20 DIAGNOSIS — K219 Gastro-esophageal reflux disease without esophagitis: Secondary | ICD-10-CM | POA: Diagnosis not present

## 2021-10-20 DIAGNOSIS — Z7901 Long term (current) use of anticoagulants: Secondary | ICD-10-CM

## 2021-10-20 DIAGNOSIS — N179 Acute kidney failure, unspecified: Secondary | ICD-10-CM | POA: Diagnosis present

## 2021-10-20 DIAGNOSIS — E782 Mixed hyperlipidemia: Secondary | ICD-10-CM | POA: Diagnosis not present

## 2021-10-20 DIAGNOSIS — T8452XD Infection and inflammatory reaction due to internal left hip prosthesis, subsequent encounter: Secondary | ICD-10-CM

## 2021-10-20 DIAGNOSIS — J8410 Pulmonary fibrosis, unspecified: Secondary | ICD-10-CM | POA: Diagnosis present

## 2021-10-20 DIAGNOSIS — M069 Rheumatoid arthritis, unspecified: Secondary | ICD-10-CM | POA: Diagnosis present

## 2021-10-20 DIAGNOSIS — Z79899 Other long term (current) drug therapy: Secondary | ICD-10-CM

## 2021-10-20 DIAGNOSIS — Z9981 Dependence on supplemental oxygen: Secondary | ICD-10-CM

## 2021-10-20 DIAGNOSIS — Z888 Allergy status to other drugs, medicaments and biological substances status: Secondary | ICD-10-CM

## 2021-10-20 DIAGNOSIS — I509 Heart failure, unspecified: Secondary | ICD-10-CM | POA: Insufficient documentation

## 2021-10-20 DIAGNOSIS — Z8261 Family history of arthritis: Secondary | ICD-10-CM

## 2021-10-20 DIAGNOSIS — R001 Bradycardia, unspecified: Secondary | ICD-10-CM | POA: Diagnosis not present

## 2021-10-20 DIAGNOSIS — Z9049 Acquired absence of other specified parts of digestive tract: Secondary | ICD-10-CM

## 2021-10-20 DIAGNOSIS — Z7951 Long term (current) use of inhaled steroids: Secondary | ICD-10-CM

## 2021-10-20 DIAGNOSIS — I10 Essential (primary) hypertension: Secondary | ICD-10-CM | POA: Diagnosis not present

## 2021-10-20 DIAGNOSIS — M9702XA Periprosthetic fracture around internal prosthetic left hip joint, initial encounter: Secondary | ICD-10-CM

## 2021-10-20 DIAGNOSIS — Z87891 Personal history of nicotine dependence: Secondary | ICD-10-CM

## 2021-10-20 DIAGNOSIS — R0989 Other specified symptoms and signs involving the circulatory and respiratory systems: Secondary | ICD-10-CM | POA: Diagnosis not present

## 2021-10-20 DIAGNOSIS — N1832 Chronic kidney disease, stage 3b: Secondary | ICD-10-CM | POA: Diagnosis present

## 2021-10-20 DIAGNOSIS — Z96653 Presence of artificial knee joint, bilateral: Secondary | ICD-10-CM | POA: Diagnosis present

## 2021-10-20 DIAGNOSIS — R0602 Shortness of breath: Secondary | ICD-10-CM | POA: Diagnosis not present

## 2021-10-20 DIAGNOSIS — I252 Old myocardial infarction: Secondary | ICD-10-CM

## 2021-10-20 DIAGNOSIS — Z79890 Hormone replacement therapy (postmenopausal): Secondary | ICD-10-CM

## 2021-10-20 DIAGNOSIS — R062 Wheezing: Secondary | ICD-10-CM | POA: Diagnosis not present

## 2021-10-20 DIAGNOSIS — E785 Hyperlipidemia, unspecified: Secondary | ICD-10-CM | POA: Diagnosis present

## 2021-10-20 DIAGNOSIS — E875 Hyperkalemia: Secondary | ICD-10-CM | POA: Diagnosis present

## 2021-10-20 LAB — BLOOD GAS, ARTERIAL
Acid-Base Excess: 5.9 mmol/L — ABNORMAL HIGH (ref 0.0–2.0)
Acid-Base Excess: 6.8 mmol/L — ABNORMAL HIGH (ref 0.0–2.0)
Bicarbonate: 35.4 mmol/L — ABNORMAL HIGH (ref 20.0–28.0)
Bicarbonate: 34 mmol/L — ABNORMAL HIGH (ref 20.0–28.0)
FIO2: 60
FIO2: 30
O2 Saturation: 98.2 %
O2 Saturation: 95.8 %
Patient temperature: 98.6
Patient temperature: 98.6
pCO2 arterial: 98.5 mmHg (ref 32.0–48.0)
pCO2 arterial: 73.6 mmHg (ref 32.0–48.0)
pH, Arterial: 7.18 — CL (ref 7.350–7.450)
pH, Arterial: 7.287 — ABNORMAL LOW (ref 7.350–7.450)
pO2, Arterial: 202 mmHg — ABNORMAL HIGH (ref 83.0–108.0)
pO2, Arterial: 92.1 mmHg (ref 83.0–108.0)

## 2021-10-20 LAB — COMPREHENSIVE METABOLIC PANEL
ALT: 13 U/L (ref 0–44)
AST: 18 U/L (ref 15–41)
Albumin: 3.2 g/dL — ABNORMAL LOW (ref 3.5–5.0)
Alkaline Phosphatase: 81 U/L (ref 38–126)
Anion gap: 7 (ref 5–15)
BUN: 54 mg/dL — ABNORMAL HIGH (ref 8–23)
CO2: 34 mmol/L — ABNORMAL HIGH (ref 22–32)
Calcium: 9 mg/dL (ref 8.9–10.3)
Chloride: 96 mmol/L — ABNORMAL LOW (ref 98–111)
Creatinine, Ser: 2.08 mg/dL — ABNORMAL HIGH (ref 0.44–1.00)
GFR, Estimated: 25 mL/min — ABNORMAL LOW (ref >60)
Glucose, Bld: 138 mg/dL — ABNORMAL HIGH (ref 70–99)
Potassium: 5.4 mmol/L — ABNORMAL HIGH (ref 3.5–5.1)
Sodium: 137 mmol/L (ref 135–145)
Total Bilirubin: 0.5 mg/dL (ref 0.3–1.2)
Total Protein: 7.1 g/dL (ref 6.5–8.1)

## 2021-10-20 LAB — CBC WITH DIFFERENTIAL/PLATELET
Abs Immature Granulocytes: 0.09 10*3/uL — ABNORMAL HIGH (ref 0.00–0.07)
Basophils Absolute: 0.1 10*3/uL (ref 0.0–0.1)
Basophils Relative: 1 %
Eosinophils Absolute: 0.3 10*3/uL (ref 0.0–0.5)
Eosinophils Relative: 4 %
HCT: 29.2 % — ABNORMAL LOW (ref 36.0–46.0)
Hemoglobin: 8.2 g/dL — ABNORMAL LOW (ref 12.0–15.0)
Immature Granulocytes: 1 %
Lymphocytes Relative: 14 %
Lymphs Abs: 1.3 10*3/uL (ref 0.7–4.0)
MCH: 28.8 pg (ref 26.0–34.0)
MCHC: 28.1 g/dL — ABNORMAL LOW (ref 30.0–36.0)
MCV: 102.5 fL — ABNORMAL HIGH (ref 80.0–100.0)
Monocytes Absolute: 0.9 10*3/uL (ref 0.1–1.0)
Monocytes Relative: 10 %
Neutro Abs: 6.4 10*3/uL (ref 1.7–7.7)
Neutrophils Relative %: 70 %
Platelets: 203 10*3/uL (ref 150–400)
RBC: 2.85 MIL/uL — ABNORMAL LOW (ref 3.87–5.11)
RDW: 16.7 % — ABNORMAL HIGH (ref 11.5–15.5)
WBC: 9.2 10*3/uL (ref 4.0–10.5)
nRBC: 0 % (ref 0.0–0.2)

## 2021-10-20 LAB — BLOOD GAS, VENOUS
Acid-Base Excess: 6.1 mmol/L — ABNORMAL HIGH (ref 0.0–2.0)
Bicarbonate: 35.9 mmol/L — ABNORMAL HIGH (ref 20.0–28.0)
FIO2: 21
O2 Saturation: 95.2 %
Patient temperature: 98.6
pCO2, Ven: 101 mmHg (ref 44.0–60.0)
pH, Ven: 7.176 — CL (ref 7.250–7.430)
pO2, Ven: 89.3 mmHg — ABNORMAL HIGH (ref 32.0–45.0)

## 2021-10-20 LAB — TROPONIN I (HIGH SENSITIVITY)
Troponin I (High Sensitivity): 24 ng/L — ABNORMAL HIGH (ref <18)
Troponin I (High Sensitivity): 21 ng/L — ABNORMAL HIGH (ref <18)

## 2021-10-20 LAB — MAGNESIUM: Magnesium: 5.9 mg/dL — ABNORMAL HIGH (ref 1.7–2.4)

## 2021-10-20 LAB — BRAIN NATRIURETIC PEPTIDE: B Natriuretic Peptide: 287 pg/mL — ABNORMAL HIGH (ref 0.0–100.0)

## 2021-10-20 MED ORDER — NYSTATIN 100000 UNIT/GM EX POWD: 1.0000 "application " | Freq: Every day | CUTANEOUS | Status: DC | PRN

## 2021-10-20 MED ORDER — PREGABALIN 50 MG PO CAPS
50.0000 mg | ORAL_CAPSULE | Freq: Every day | ORAL | Status: DC
  Administered 2021-10-21 – 2021-10-24 (×4): 50 mg via ORAL
  Filled 2021-10-20 (×4): qty 1

## 2021-10-20 MED ORDER — LOSARTAN POTASSIUM 50 MG PO TABS
50.0000 mg | ORAL_TABLET | Freq: Every day | ORAL | Status: DC
  Administered 2021-10-21: 09:00:00 50 mg via ORAL
  Filled 2021-10-20: qty 1

## 2021-10-20 MED ORDER — ACETAMINOPHEN 325 MG PO TABS: 650.0000 mg | ORAL_TABLET | Freq: Four times a day (QID) | ORAL | Status: DC | PRN

## 2021-10-20 MED ORDER — METOPROLOL TARTRATE 25 MG PO TABS: 12.5000 mg | ORAL_TABLET | Freq: Two times a day (BID) | ORAL | Status: DC

## 2021-10-20 MED ORDER — UMECLIDINIUM BROMIDE 62.5 MCG/ACT IN AEPB: 1.0000 | INHALATION_SPRAY | Freq: Every day | RESPIRATORY_TRACT | Status: DC

## 2021-10-20 MED ORDER — UMECLIDINIUM BROMIDE 62.5 MCG/ACT IN AEPB
1.0000 | INHALATION_SPRAY | Freq: Every day | RESPIRATORY_TRACT | Status: DC
Start: 2021-10-21 — End: 2021-10-24
  Administered 2021-10-23 – 2021-10-24 (×2): 1 via RESPIRATORY_TRACT
  Filled 2021-10-20 (×2): qty 7

## 2021-10-20 MED ORDER — DULOXETINE HCL 30 MG PO CPEP
30.0000 mg | ORAL_CAPSULE | Freq: Every day | ORAL | Status: DC
  Administered 2021-10-21 – 2021-10-25 (×5): 30 mg via ORAL
  Filled 2021-10-20 (×5): qty 1

## 2021-10-20 MED ORDER — LEVOTHYROXINE SODIUM 25 MCG PO TABS
125.0000 ug | ORAL_TABLET | Freq: Every day | ORAL | Status: DC
  Administered 2021-10-22 – 2021-10-25 (×4): 125 ug via ORAL
  Filled 2021-10-20 (×4): qty 1

## 2021-10-20 MED ORDER — MAGNESIUM SULFATE 2 GM/50ML IV SOLN
2.0000 g | Freq: Once | INTRAVENOUS | Status: AC
  Administered 2021-10-20: 18:00:00 2 g via INTRAVENOUS
  Filled 2021-10-20: qty 50

## 2021-10-20 MED ORDER — DILTIAZEM HCL ER COATED BEADS 120 MG PO CP24: 240.0000 mg | ORAL_CAPSULE | Freq: Every day | ORAL | Status: DC

## 2021-10-20 MED ORDER — ALPRAZOLAM 0.25 MG PO TABS
0.2500 mg | ORAL_TABLET | Freq: Once | ORAL | Status: AC
  Administered 2021-10-20: 0.25 mg via ORAL
  Filled 2021-10-20: qty 1

## 2021-10-20 MED ORDER — MONTELUKAST SODIUM 10 MG PO TABS
10.0000 mg | ORAL_TABLET | Freq: Every day | ORAL | Status: DC
  Administered 2021-10-21 – 2021-10-25 (×5): 10 mg via ORAL
  Filled 2021-10-20 (×5): qty 1

## 2021-10-20 MED ORDER — APIXABAN 5 MG PO TABS
5.0000 mg | ORAL_TABLET | Freq: Two times a day (BID) | ORAL | Status: DC
  Administered 2021-10-21 – 2021-10-25 (×9): 5 mg via ORAL
  Filled 2021-10-20 (×10): qty 1

## 2021-10-20 MED ORDER — ATORVASTATIN CALCIUM 40 MG PO TABS
40.0000 mg | ORAL_TABLET | Freq: Every day | ORAL | Status: DC
  Administered 2021-10-21 – 2021-10-25 (×5): 40 mg via ORAL
  Filled 2021-10-20 (×5): qty 1

## 2021-10-20 MED ORDER — ALBUTEROL SULFATE (2.5 MG/3ML) 0.083% IN NEBU: 2.5000 mg | INHALATION_SOLUTION | RESPIRATORY_TRACT | Status: DC | PRN

## 2021-10-20 MED ORDER — FERROUS SULFATE 325 (65 FE) MG PO TABS
325.0000 mg | ORAL_TABLET | Freq: Every day | ORAL | Status: DC
  Administered 2021-10-21 – 2021-10-25 (×5): 325 mg via ORAL
  Filled 2021-10-20 (×5): qty 1

## 2021-10-20 MED ORDER — FUROSEMIDE 10 MG/ML IJ SOLN
40.0000 mg | Freq: Once | INTRAMUSCULAR | Status: AC
  Administered 2021-10-20: 18:00:00 40 mg via INTRAVENOUS
  Filled 2021-10-20: qty 4

## 2021-10-20 MED ORDER — IPRATROPIUM-ALBUTEROL 0.5-2.5 (3) MG/3ML IN SOLN
3.0000 mL | Freq: Once | RESPIRATORY_TRACT | Status: AC
  Administered 2021-10-20: 17:00:00 3 mL via RESPIRATORY_TRACT
  Filled 2021-10-20: qty 3

## 2021-10-20 MED ORDER — ARFORMOTEROL TARTRATE 15 MCG/2ML IN NEBU
15.0000 ug | INHALATION_SOLUTION | Freq: Two times a day (BID) | RESPIRATORY_TRACT | Status: DC
  Administered 2021-10-21 – 2021-10-25 (×9): 15 ug via RESPIRATORY_TRACT
  Filled 2021-10-20 (×10): qty 2

## 2021-10-20 MED ORDER — ACETAMINOPHEN 650 MG RE SUPP: 650.0000 mg | Freq: Four times a day (QID) | RECTAL | Status: DC | PRN

## 2021-10-20 MED ORDER — PREDNISONE 20 MG PO TABS
40.0000 mg | ORAL_TABLET | Freq: Every day | ORAL | Status: DC
  Administered 2021-10-21: 09:00:00 40 mg via ORAL
  Filled 2021-10-20: qty 2

## 2021-10-20 MED ORDER — CHLORHEXIDINE GLUCONATE 0.12 % MT SOLN
15.0000 mL | Freq: Two times a day (BID) | OROMUCOSAL | Status: DC
  Administered 2021-10-21 – 2021-10-24 (×8): 15 mL via OROMUCOSAL
  Filled 2021-10-20 (×8): qty 15

## 2021-10-20 MED ORDER — METHYLPREDNISOLONE SODIUM SUCC 125 MG IJ SOLR
125.0000 mg | Freq: Once | INTRAMUSCULAR | Status: AC
  Administered 2021-10-20: 17:00:00 125 mg via INTRAVENOUS
  Filled 2021-10-20: qty 2

## 2021-10-20 MED ORDER — SODIUM CHLORIDE 0.9% FLUSH
3.0000 mL | Freq: Two times a day (BID) | INTRAVENOUS | Status: DC
  Administered 2021-10-20 – 2021-10-24 (×9): 3 mL via INTRAVENOUS

## 2021-10-20 MED ORDER — LINEZOLID 600 MG PO TABS
600.0000 mg | ORAL_TABLET | Freq: Two times a day (BID) | ORAL | Status: DC
  Administered 2021-10-21 – 2021-10-25 (×9): 600 mg via ORAL
  Filled 2021-10-20 (×10): qty 1

## 2021-10-20 MED ORDER — PANTOPRAZOLE SODIUM 40 MG PO TBEC
40.0000 mg | DELAYED_RELEASE_TABLET | Freq: Every day | ORAL | Status: DC
  Administered 2021-10-21 – 2021-10-25 (×5): 40 mg via ORAL
  Filled 2021-10-20 (×5): qty 1

## 2021-10-20 MED ORDER — POLYVINYL ALCOHOL 1.4 % OP SOLN: 1.0000 [drp] | Freq: Three times a day (TID) | OPHTHALMIC | Status: DC | PRN

## 2021-10-20 MED ORDER — IPRATROPIUM-ALBUTEROL 0.5-2.5 (3) MG/3ML IN SOLN
3.0000 mL | Freq: Four times a day (QID) | RESPIRATORY_TRACT | Status: DC
  Administered 2021-10-20 – 2021-10-21 (×3): 3 mL via RESPIRATORY_TRACT
  Filled 2021-10-20 (×2): qty 3

## 2021-10-20 MED ORDER — FUROSEMIDE 10 MG/ML IJ SOLN
40.0000 mg | Freq: Two times a day (BID) | INTRAMUSCULAR | Status: DC
  Administered 2021-10-21 – 2021-10-23 (×5): 40 mg via INTRAVENOUS
  Filled 2021-10-20 (×5): qty 4

## 2021-10-20 MED ORDER — ORAL CARE MOUTH RINSE
15.0000 mL | Freq: Two times a day (BID) | OROMUCOSAL | Status: DC
  Administered 2021-10-21 – 2021-10-23 (×5): 15 mL via OROMUCOSAL

## 2021-10-20 MED ORDER — DOFETILIDE 250 MCG PO CAPS
250.0000 ug | ORAL_CAPSULE | Freq: Two times a day (BID) | ORAL | Status: DC
  Filled 2021-10-20: qty 1

## 2021-10-20 MED ORDER — CHLORHEXIDINE GLUCONATE CLOTH 2 % EX PADS: 6.0000 | MEDICATED_PAD | Freq: Every day | CUTANEOUS | Status: DC

## 2021-10-20 MED ORDER — BUDESONIDE 0.25 MG/2ML IN SUSP
0.2500 mg | Freq: Two times a day (BID) | RESPIRATORY_TRACT | Status: DC
  Administered 2021-10-20 – 2021-10-25 (×10): 0.25 mg via RESPIRATORY_TRACT
  Filled 2021-10-20 (×10): qty 2

## 2021-10-20 NOTE — H&P (Addendum)
History and Physical   Fortune [place]ards [id number] DOB: January 19, 1946 DOA: 10/20/2021  PCP: Isaac Bliss, Rayford Halsted, MD   Patient coming from: Home  Chief Complaint: Shortness of breath, chest pain  HPI: [name] is a 75 y.o. female with medical history significant of atrial fibrillation, Barrett's esophagus, CAD status post CABG x3, CHF, COPD on chronic oxygen, hypertension, GERD, hyperlipidemia, hypothyroidism, intracranial hemorrhage, interstitial lung disease, obesity, CKD 3B, depression, psoriatic arthritis who presents with worsening shortness of breath and chest pain.  Patient states that she had symptoms onset this afternoon and were progressive.  She started having shortness of breath and has had some intermittent associated left-sided chest pain.  She stated that this has felt similar to previous CP OPD exacerbations.  She reports she is taking her home medications including all her inhalers and nebulizers as prescribed.  She also continues to use her home 2 L of oxygen.  Of note, recently admitted from 12/4 until 12/14.  This was due to [facility] prosthetic infection in the setting of hip replacement in November of this year.  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  She also has a wound VAC in place.  She denies fevers, chills, abdominal pain, constipation, diarrhea, nausea, vomiting.  ED Course: Vital signs in the ED significant for heart rate in the 50s to 60s and blood pressure in the 0000000 to 0000000 systolic.  Patient requiring high levels of oxygen and BiPAP to maintain saturations.  Lab work-up showed CMP with potassium 5.4, chloride 96, bicarb 34 which is stable, BUN 54, creatinine elevated 2.08 from baseline of 1.5, glucose 138, albumin 3.2.  CBC showed hemoglobin stable at 8.2.  Troponin mildly elevated at 24 with repeat pending.  BNP elevated at 287.  Respiratory panel for flu and COVID pending.  VBG showed pH 7.176  and PCO2 of 101, subsequent repeat ABG on BiPAP showed mild improvement with pH 7.18 and PCO2 of 98.  Chest x-ray showed stable cardiomegaly with mild pulmonary edema and suspicion for trace bilateral effusions.  Patient started on Lasix, DuoNeb, Solu-Medrol, magnesium and BiPAP in the ED.  Critical care was consulted and states they will follow in consult on the patient but does not meet picture for ICU admission at this time.  Review of Systems: As per HPI otherwise all other systems reviewed and are negative.  Past Medical History:  Diagnosis Date   Anemia    Arthritis    OA RIGHT KNEE WITH PAIN   Barrett esophagus    BiPAP (biphasic positive airway pressure)    Wears at night   Bradycardia 06/01/2015   CAD in native artery    a. NSTEMI 05/2015 s/p emergent CABG.   Chronic diastolic (congestive) heart failure (HCC)    Chronic kidney disease, stage 3a (HCC)    Chronic respiratory failure (HCC)    Chronic respiratory failure with hypoxia and hypercapnia ([place]) 02/04/2010   Followed in Pulmonary clinic/ [facility]/ [facility]       - 02 dependent  since 07/02/10 >>  83% RA December 05, 2010       - ONO RA 08/05/12  :  Positive sat < 89 x 2:65m> repeat on 2lpm rec 08/12/2012  - 06/17/2013 reported desat with activity p Knee surgery > rec restart 2lpm with activity  - 06/27/2013   Walked 2lpm  x one lap @ 185 stopped due to sat 88% not sob , desat to 82%  on RA just at th   COPD III spirometry if use FEV1/VC p saba  07/18/2010   Quit smoking May 2006       - PFT's  04/12/10 FEV1  1.21 (69%) ratio 77 and no change p B2,  DLC0 56%   VC 70%         - PFTs  08/08/2013 FEV1 1.21 (60%) ratio 86 and no change p B2 DLCO 79%  VC 72%  On symbicort 160 2bid  - PFT's  02/08/2018  FEV1 0.70 (40 % ) ratio 56 if use FEV1/VC  p 38 % improvement from saba p symb 160 prior to study with DLCO  78 % corrects to 147  % for alv volume   - 02/08/2018   Cough variant asthma 02/26/2011   Followed in Pulmonary clinic/ [facility]/ [facility]  - PFT's  06/04/15  FEV1 1.20 (67 % ) ratio 83  p 6 % improvement from saba with DLCO  80 % corrects to 132 % for alv volume      - Clinical dx based on response to symbicort       FENO 09/16/2016  =   96 on symbicort 160 2bid > added singulair  Allergy profile 09/16/2016 >  Eos 0.5 /  IgE  78 neg RAST  -  Referred to rehab 04/29/2017 > completed   Depression    Dysrhythmia    Afib   Essential hypertension 04/20/2007   Qualifier: Diagnosis of  By: Paulina Fusi, RN, Daine Gravel    GERD (gastroesophageal reflux disease)    History of ARDS 2006   History of home oxygen therapy    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 small intracranial hemorrhage in setting of HTN.   Mild carotid artery disease ([place])    a. Duplex 1-39% bilaterally in 2016.   Morbid (severe) obesity due to excess calories ([place]) 04/22/2015   pfts with erv 14% 06/04/15  And 33% 02/08/2018    NSTEMI (non-ST elevated myocardial infarction) ([place]) 05/31/2015   Pneumococcal pneumonia ([place]) 2006   HOSPITALIZED AND DEVELOPED ARDS   Psoriasis    Psoriatic arthritis ([place])    PULMONARY FIBROSIS ILD POST INFLAMMATORY CHRONIC 07/18/2010   Followed as Primary Care Patient/ [facility]/ [facility]  -s/p ARDS 2006 with bacteremic S  Pna       - CT chest 07/03/10 Nonspecific PF mostly upper lobes       - CT chest 12/03/10 acute gg changes and effusions c/w chf - PFT's  02/08/2018  FVC 0.64 (28 %)   with DLCO  78 % corrects to 147 % for alv volume      Rhematoid arthritis    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   SOB (shortness of breath) on exertion     Past Surgical History:  Procedure Laterality Date   ABDOMINOPLASTY     CARDIAC CATHETERIZATION N/A 06/01/2015   Procedure: Left Heart Cath and Coronary Angiography;  Surgeon: Lorretta Harp, MD;  Location: [facility];  Service: Cardiovascular;  Laterality: N/A;   CARDIOVERSION N/A 02/12/2021   Procedure:  CARDIOVERSION;  Surgeon: Freada Bergeron, MD;  Location: [facility] ENDOSCOPY;  Service: Cardiovascular;  Laterality: N/A;   CARPAL TUNNEL RELEASE     CHOLECYSTECTOMY     CORONARY ARTERY BYPASS GRAFT N/A 06/04/2015   Procedure: CORONARY ARTERY BYPASS GRAFT times three  with left internal mammary artery and right leg saphenous vein;  Surgeon: Gaye Pollack, MD;  Location: [facility] OR;  Service: Open Heart Surgery;  Laterality: N/A;   cosmetic breast surgery     EYE SURGERY     cataract   INCISION AND DRAINAGE HIP Left 10/10/2021   Procedure: IRRIGATION AND DEBRIDEMENT HIP WITH WOUND VAC;  Surgeon: Gaynelle Arabian, MD;  Location: [facility] ORS;  Service: Orthopedics;  Laterality: Left;   JOINT REPLACEMENT     KNEE ARTHROSCOPY Left    TEE WITHOUT CARDIOVERSION  06/04/2015   Procedure: TRANSESOPHAGEAL ECHOCARDIOGRAM (TEE);  Surgeon: Gaye Pollack, MD;  Location: [facility] OR;  Service: Open Heart Surgery;;   TEE WITHOUT CARDIOVERSION N/A 02/12/2021   Procedure: TRANSESOPHAGEAL ECHOCARDIOGRAM (TEE);  Surgeon: Freada Bergeron, MD;  Location: [facility] ENDOSCOPY;  Service: Cardiovascular;  Laterality: N/A;   TOTAL HIP ARTHROPLASTY Left 09/16/2021   Procedure: TOTAL HIP ARTHROPLASTY ANTERIOR APPROACH;  Surgeon: Gaynelle Arabian, MD;  Location: [facility] ORS;  Service: Orthopedics;  Laterality: Left;   TOTAL KNEE ARTHROPLASTY Left    TOTAL KNEE ARTHROPLASTY Right 06/06/2013   Procedure: RIGHT TOTAL KNEE ARTHROPLASTY;  Surgeon: Gearlean Alf, MD;  Location: [facility];  Service: Orthopedics;  Laterality: Right;    Social History  reports that she quit smoking about 16 years ago. Her smoking use included cigarettes. She has a 87.75 pack-year smoking history. She has never used smokeless tobacco. She reports that she does not drink alcohol and does not use drugs.  Allergies  Allergen Reactions   Gabapentin Other (See Comments)    Dizziness, lighthead    Family History  Problem Relation Age of Onset   Breast cancer  Mother    Coronary artery disease Father    Rheum arthritis Father   Reviewed on admission  Prior to Admission medications   Medication Sig Start Date End Date Taking? Authorizing Provider  acetaminophen (TYLENOL) 650 MG CR tablet Take 650 mg by mouth 2 (two) times daily as needed for pain.    [provider]  albuterol (VENTOLIN HFA) 108 (90 Base) MCG/ACT inhaler Inhale 2 puffs into the lungs every 4 (four) hours as needed (shortness of breath, if you can't catch your breath). 06/04/20   Tanda Rockers, MD  apixaban (ELIQUIS) 5 MG TABS tablet Take 1 tablet (5 mg total) by mouth 2 (two) times daily. 06/17/21   [place], Clint R, PA  atorvastatin (LIPITOR) 40 MG tablet Take 40 mg by mouth daily.    [provider]  budesonide (PULMICORT) 0.25 MG/2ML nebulizer solution One twice daily Patient taking differently: Take 0.25 mg by nebulization 2 (two) times daily. 07/02/20   Tanda Rockers, MD  CALCIUM PO Take 1 tablet by mouth daily.    [provider]  Cholecalciferol (VITAMIN D3) 2000 UNITS TABS Take 2,000 Units by mouth daily.    [provider]  diltiazem (CARDIZEM CD) 240 MG 24 hr capsule Take 1 capsule (240 mg total) by mouth daily. 09/11/21   Werner Lean, MD  dofetilide (TIKOSYN) 250 MCG capsule Take 1 capsule by mouth twice daily Patient taking differently: Take 250 mcg by mouth 2 (two) times daily. 08/20/21   Shirley Friar, PA-C  DULoxetine (CYMBALTA) 30 MG capsule Take 30 mg by mouth daily.    [provider]  ferrous sulfate 325 (65 FE) MG tablet Take 1 tablet (325 mg total) by mouth daily with breakfast. Patient taking differently: Take 325 mg by mouth at bedtime.  01/10/21 10/07/22  Riley Lam A, MD  formoterol (PERFOROMIST) 20 MCG/2ML nebulizer solution Take 2 mLs (20 mcg total) by nebulization 2 (two) times daily. Use in nebulizer twice daily perfectly regularly 07/02/20   Nyoka Cowden, MD  golimumab (SIMPONI  ARIA) 50 MG/4ML SOLN injection Inject 50 mg into the vein every 8 (eight) weeks.     [provider]  hydrocortisone 2.5 % cream Apply 1 application topically daily as needed for itching.    [provider]  hydroxypropyl methylcellulose / hypromellose (ISOPTO TEARS / GONIOVISC) 2.5 % ophthalmic solution Place 1 drop into both eyes 3 (three) times daily as needed for dry eyes.    [provider]  ketoconazole (NIZORAL) 2 % cream Apply 1 application topically daily as needed for irritation. 01/30/21   [provider]  leflunomide (ARAVA) 20 MG tablet Take 1 tablet (20 mg total) by mouth daily. 03/15/19   [name], [name], MD  levothyroxine (SYNTHROID) 125 MCG tablet TAKE 1 TABLET BY MOUTH EVERY DAY Patient taking differently: Take 125 mcg by mouth daily before breakfast. 06/20/20   [name], [name], MD  linezolid (ZYVOX) 600 MG tablet Take 1 tablet (600 mg total) by mouth every 12 (twelve) hours. 10/16/21 11/15/21  Edmisten, Lyn Hollingshead, PA  losartan (COZAAR) 50 MG tablet Take 1 tablet (50 mg total) by mouth daily. 01/17/21   Christell Constant, MD  Magnesium Oxide 400 MG CAPS Take 1 capsule (400 mg total) by mouth daily. 06/06/21   Riley Lam A, MD  metoprolol tartrate (LOPRESSOR) 25 MG tablet Take 0.5 tablets (12.5 mg total) by mouth 2 (two) times daily. 10/16/21   Joseph Art, DO  montelukast (SINGULAIR) 10 MG tablet Take 10 mg by mouth daily. 03/22/21   [provider]  nitroGLYCERIN (NITROSTAT) 0.4 MG SL tablet Place 1 tablet (0.4 mg total) under the tongue every 5 (five) minutes as needed for chest pain. 05/24/21   Chandrasekhar, Lafayette Dragon A, MD  nystatin (MYCOSTATIN/NYSTOP) powder APPLY 1 APPLICATION        TOPICALLY 3 TIMES A DAY Patient taking differently: Apply 1 application topically daily as needed (psoriasis). 03/07/21   [name], [name], MD  OXYGEN Inhale 2 L into the lungs continuous.    [provider]  pantoprazole (PROTONIX) 40 MG tablet Take 1 tablet (40 mg total) by mouth daily before breakfast. 01/10/21   Nyoka Cowden, MD  pregabalin (LYRICA) 50 MG capsule Take 50 mg by mouth at bedtime. 03/13/21   [provider]  torsemide (DEMADEX) 20 MG tablet Take 1 tablet (20 mg total) by mouth 2 (two) times daily. 06/21/21   Narda Bonds, MD  triamcinolone cream (KENALOG) 0.1 % Apply 1 application topically 2 (two) times daily as needed (for psoriasis).     [provider]  umeclidinium bromide (INCRUSE ELLIPTA) 62.5 MCG/INH AEPB Inhale 1 puff into the lungs daily. 03/14/21   Nyoka Cowden, MD  vitamin B-12 (CYANOCOBALAMIN) 1000 MCG tablet Take 1,000 mcg by mouth daily.    [provider]    Physical Exam: Vitals:   10/20/21 1800 10/20/21 1830 10/20/21 1915 10/20/21 1922  BP: (!) 115/50 130/60 (!) 143/94 (!) 143/94  Pulse: (!) 55 (!) 55 (!) 58   Resp: 20 18 20  (!) 21  Temp:      TempSrc:      SpO2: 100% 98% 98% 96%   Physical Exam Constitutional:      General: She  is not in acute distress.    Appearance: Normal appearance. She is obese.  HENT:     Head: Normocephalic and atraumatic.     Mouth/Throat:     Mouth: Mucous membranes are moist.     Pharynx: Oropharynx is clear.  Eyes:     Extraocular Movements: Extraocular movements intact.     Pupils: Pupils are equal, round, and reactive to light.  Cardiovascular:     Rate and Rhythm: Regular rhythm. Bradycardia present.     Pulses: Normal pulses.     Heart sounds: Normal heart sounds.  Pulmonary:     Effort: Pulmonary effort is normal. No respiratory distress.     Breath sounds: Wheezing present.     Comments: Mechanical breath sounds, on BiPAP. Abdominal:     General: Bowel sounds are normal. There is no distension.     Palpations: Abdomen is soft.     Tenderness: There is no abdominal tenderness.  Musculoskeletal:        General: No swelling or deformity.     Right lower leg: Edema present.      Left lower leg: Edema present.     Comments: Wound VAC in place on L hip.  Skin:    General: Skin is warm and dry.  Neurological:     General: No focal deficit present.     Mental Status: Mental status is at baseline.   Labs on Admission: I have personally reviewed following labs and imaging studies  CBC: Recent Labs  Lab 10/14/21 0355 10/14/21 1352 10/16/21 0401 10/20/21 1645  WBC 8.4  --  10.7* 9.2  NEUTROABS  --   --   --  6.4  HGB 7.7* 8.2* 8.0* 8.2*  HCT 26.5* 27.8* 28.1* 29.2*  MCV 99.3  --  101.1* 102.5*  PLT 238  --  197 203    Basic Metabolic Panel: Recent Labs  Lab 10/14/21 0355 10/15/21 0406 10/16/21 0401 10/20/21 1645  NA 141 141 142 137  K 3.4* 4.2 4.2 5.4*  CL 93* 96* 96* 96*  CO2 39* 37* 34* 34*  GLUCOSE 109* 105* 124* 138*  BUN 39* 43* 42* 54*  CREATININE 1.37* 1.38* 1.51* 2.08*  CALCIUM 8.7* 8.8* 9.1 9.0  MG  --   --  1.8  --   PHOS  --   --  3.9  --     GFR: Estimated Creatinine Clearance: 24.2 mL/min (A) (by C-G formula based on SCr of 2.08 mg/dL (H)).  Liver Function Tests: Recent Labs  Lab 10/20/21 1645  AST 18  ALT 13  ALKPHOS 81  BILITOT 0.5  PROT 7.1  ALBUMIN 3.2*    Urine analysis:    Component Value Date/Time   COLORURINE YELLOW 06/18/2021 0457   APPEARANCEUR HAZY (A) 06/18/2021 0457   LABSPEC 1.014 06/18/2021 0457   PHURINE 5.0 06/18/2021 0457   GLUCOSEU NEGATIVE 06/18/2021 0457   GLUCOSEU NEGATIVE 05/29/2021 1557   HGBUR NEGATIVE 06/18/2021 0457   HGBUR negative 07/18/2010 0941   BILIRUBINUR NEGATIVE 06/18/2021 0457   BILIRUBINUR n 08/10/2012 1131   KETONESUR NEGATIVE 06/18/2021 0457   PROTEINUR NEGATIVE 06/18/2021 0457   UROBILINOGEN 0.2 05/29/2021 1557   NITRITE NEGATIVE 06/18/2021 0457   LEUKOCYTESUR NEGATIVE 06/18/2021 0457    Radiological Exams on Admission: DG Chest Portable 1 View  Result Date: 10/20/2021 CLINICAL DATA:  Shortness of breath. EXAM: PORTABLE CHEST 1 VIEW COMPARISON:  Chest  radiograph 10/07/2021 FINDINGS: Low lung volumes with central vascular congestion. Diffuse hazy  interstitial thickening. Probable trace bilateral pleural effusions. No pneumothorax. Stable enlarged cardiac silhouette. Postoperative changes of median sternotomy and coronary artery bypass graft. IMPRESSION: Stable cardiomegaly with mild pulmonary edema and probable trace bilateral pleural effusions. Electronically Signed   By: Ileana Roup M.D.   On: 10/20/2021 16:42    EKG: Independently reviewed.  Sinus rhythm at 56 bpm.  RSR prime in V1 and V2.  Similar to previous.  Rate today is slower.  Assessment/Plan Principal Problem:   Acute on chronic respiratory failure with hypoxia and hypercapnia (HCC) Active Problems:   Hypothyroidism   Essential hypertension   GERD (gastroesophageal reflux disease)   Barrett's esophagus   S/P CABG x 3   Hyperlipidemia   COPD with acute exacerbation (HCC)   Atrial fibrillation (HCC)   CAD in native artery   Acute renal failure superimposed on stage 3b chronic kidney disease (HCC)   CHF exacerbation (HCC)  Acute on chronic respiratory failure with hypoxia and hypercapnia CHF exacerbation COPD exacerbation > Patient presenting with acute respiratory failure on chronic likely multifactorial.  Diffuse wheezing and similar symptoms to prior COPD extubations.  Also with evidence of CHF exacerbation with mild pulmonary edema and elevated BNP.  Also has chronic anemia currently at 8.2 in the ED. > Patient was hypoxic and requiring additional oxygen over her baseline 2 L that she is on chronically.  Was placed on BiPAP due to VBG with pH 7.17 and PCO2 101.  Had mild improvement with repeat ABG on BiPAP to 7.18 and 98 respectively.  Critical care was consulted due to this and they states they will consult but patient does not currently meet ICU criteria. > Confirmed with patient and family that she would like to be intubated if necessary to support her respiratory status.   She has partial code as below. - Appreciate pulmonary critical care recommendations - Admit to stepdown unit - Continue with continuous BiPAP - Repeat ABG  - Continue with home nebs and inhalers for COPD - Continue home losartan for CHF. - Holding home metoprolol due to bradycardia as below. - Continue with daily steroids, scheduled duo nebs, as needed albuterol nebs for COPD exacerbation - Continue with IV Lasix and monitoring daily weights and ins and outs for CHF exacerbation - Hold off on repeat echo as her last was done just 2 months ago. - Check magnesium - Trend renal function electrolytes  AKI on CKD 3B Hyperkalemia > Creatinine elevated to 2.08 from baseline of around 1.5 in the ED. > Potassium mildly elevated at 5.4 in the ED. > Likely AKI is due to volume overload in setting of mild CHF exacerbation. > Has received dose of Lasix in the ED. - We will be continue with Lasix for CHF exacerbation we will monitor for improvement in creatinine. - Would also expect potassium to improve with Lasix administration. - Avoid nephrotoxic agents - Trend renal function and electrolytes  Periprosthetic infection > Patient recently admitted earlier this month due to periprosthetic hip fracture and infection in the setting of hip replacement in November of this year. > Grew out MRSA at that time all cultures and had wound VAC placed and was put on linezolid. - Continue with wound VAC - Continue with linezolid twice daily  Anemia > Hemoglobin stable at 8.2 in the ED. - Continue to monitor CBC  Atrial fibrillation - Continue home Tikosyn - Holding diltiazem and metoprolol in the setting of borderline bradycardia - Continue home Eliquis  Hypertension - Continue home  losartan - On Lasix as above - Holding home metoprolol and diltiazem in the setting of bradycardia as above.  Hypothyroidism - Continue home Synthroid  Depression - Continue home duloxetine    CAD Hyperlipidemia > History of NSTEMI and CABG x3 - Continue home atorvastatin - Holding home metoprolol as above - Continue home losartan  GERD Barrett's esophagus - Continue PPI  DVT prophylaxis: Eliquis  Code Status:   Partial.  Does not want to be shocked or have CPR or ACLS medications.  Is okay with being intubated if if needed.  Family Communication:  Family updated at bedside.  Daughter was at bedside and son was on the phone.   Disposition Plan:   Patient is from:  Home  Anticipated DC to:  Home  Anticipated DC date:  2 to 5 days  Anticipated DC barriers: None  Consults called:  Critical care consulted in the ED and will be following along  Admission status:  Inpatient, stepdown   Severity of Illness: The appropriate patient status for this patient is INPATIENT. Inpatient status is judged to be reasonable and necessary in order to provide the required intensity of service to ensure the patient's safety. The patient's presenting symptoms, physical exam findings, and initial radiographic and laboratory data in the context of their chronic comorbidities is felt to place them at high risk for further clinical deterioration. Furthermore, it is not anticipated that the patient will be medically stable for discharge from the hospital within 2 midnights of admission.   * I certify that at the point of admission it is my clinical judgment that the patient will require inpatient hospital care spanning beyond 2 midnights from the point of admission due to high intensity of service, high risk for further deterioration and high frequency of surveillance required.Marcelyn Bruins MD [facility]  How to contact the [facility] Attending or Consulting provider [place] or covering provider during after hours [place], for this patient?   Check the care team in [facility] and look for a) attending/consulting [facility] provider listed and b) the [facility] team listed Log into [email or web address] and use [facility]'s universal password to access. If you do not have the password, please contact the hospital operator. Locate the [facility] provider you are looking for under [facility] and page to a number that you can be directly reached. If you still have difficulty reaching the provider, please page the [facility] (Director on Call) for the Hospitalists listed on amion for assistance.  10/20/2021, 8:21 PM

## 2021-10-20 NOTE — Progress Notes (Signed)
Pt transported to ICU on BIPAP without complication.  

## 2021-10-20 NOTE — ED Provider Notes (Signed)
[facility] DEPT Provider Note   CSN: [id number] Arrival date & time: 10/20/21  1603     History Chief Complaint  Patient presents with   Shortness of Breath    w   Weakness    [name] is a 75 y.o. female.  HPI  Patient is a 75 year old female with a history of morbid obesity, interstitial lung disease, status post CABG x3, hyperlipidemia, COPD, atrial flutter, atrial fibrillation, CKD, CHF, RA, hyperlipidemia, who presents to the emergency department due to shortness of breath and chest pain.  Patient states that this afternoon she began experiencing worsening shortness of breath.  Also complains of intermittent left-sided chest pain.  Patient is unable to provide details regarding her chest pain.  Denies any abdominal pain, nausea, vomiting.  States her current symptoms feel like previous COPD exacerbations.  She states that she has been compliant with her inhalers and nebulizers at home.  She states that she is anticoagulated on Eliquis and denies any missed doses.  She states that she wears 2 L via nasal cannula chronically.  Patient recently admitted to the hospital on December 4 and discharged on December 14.  Patient is status post left hip arthroplasty on November 14.  After being discharged she continued to develop worsening pain in the hip and is becoming less and less mobile.  She was found to have a low-grade fever and CT of the left hip was concerning for periprosthetic intertrochanteric left femur fracture and periprosthetic fluid collections.  Orthopedics performed irrigation and debridement of the wound on December 8.  Wound VAC was placed on December 8.  She was started on 600 mg of linezolid twice daily.     Past Medical History:  Diagnosis Date   Anemia    Arthritis    OA RIGHT KNEE WITH PAIN   Barrett esophagus    BiPAP (biphasic positive airway pressure)    Wears at night   Bradycardia 06/01/2015   CAD in native  artery    a. NSTEMI 05/2015 s/p emergent CABG.   Chronic diastolic (congestive) heart failure (HCC)    Chronic kidney disease, stage 3a (HCC)    Chronic respiratory failure (HCC)    Chronic respiratory failure with hypoxia and hypercapnia ([place]) 02/04/2010   Followed in Pulmonary clinic/ [facility]/ [facility]       - 02 dependent  since 07/02/10 >>  83% RA December 05, 2010       - ONO RA 08/05/12  :  Positive sat < 89 x 2:62m> repeat on 2lpm rec 08/12/2012  - 06/17/2013 reported desat with activity p Knee surgery > rec restart 2lpm with activity  - 06/27/2013   Walked 2lpm  x one lap @ 185 stopped due to sat 88% not sob , desat to 82% on RA just at th   COPD III spirometry if use FEV1/VC p saba  07/18/2010   Quit smoking May 2006       - PFT's  04/12/10 FEV1  1.21 (69%) ratio 77 and no change p B2,  DLC0 56%   VC 70%         - PFTs  08/08/2013 FEV1 1.21 (60%) ratio 86 and no change p B2 DLCO 79%  VC 72%  On symbicort 160 2bid  - PFT's  02/08/2018  FEV1 0.70 (40 % ) ratio 56 if use FEV1/VC  p 38 % improvement from saba p symb 160 prior to study with DLCO  78 %  corrects to 147  % for alv volume   - 02/08/2018   Cough variant asthma 02/26/2011   Followed in Pulmonary clinic/ [facility]/ [facility]  - PFT's  06/04/15  FEV1 1.20 (67 % ) ratio 83  p 6 % improvement from saba with DLCO  80 % corrects to 132 % for alv volume      - Clinical dx based on response to symbicort       FENO 09/16/2016  =   96 on symbicort 160 2bid > added singulair  Allergy profile 09/16/2016 >  Eos 0.5 /  IgE  78 neg RAST  -  Referred to rehab 04/29/2017 > completed   Depression    Dysrhythmia    Afib   Essential hypertension 04/20/2007   Qualifier: Diagnosis of  By: Paulina Fusi, RN, Daine Gravel    GERD (gastroesophageal reflux disease)    History of ARDS 2006   History of home oxygen therapy    2 L / MIN NASAL CANNULA  continous   Hyperlipidemia 07/12/2015   Hypothyroidism    Intracranial hemorrhage ([place]) 2019   a. small intracranial  hemorrhage in setting of HTN.   Mild carotid artery disease ([place])    a. Duplex 1-39% bilaterally in 2016.   Morbid (severe) obesity due to excess calories ([place]) 04/22/2015   pfts with erv 14% 06/04/15  And 33% 02/08/2018    NSTEMI (non-ST elevated myocardial infarction) ([place]) 05/31/2015   Pneumococcal pneumonia ([place]) 2006   HOSPITALIZED AND DEVELOPED ARDS   Psoriasis    Psoriatic arthritis ([place])    PULMONARY FIBROSIS ILD POST INFLAMMATORY CHRONIC 07/18/2010   Followed as Primary Care Patient/ [facility]/ [facility]  -s/p ARDS 2006 with bacteremic S  Pna       - CT chest 07/03/10 Nonspecific PF mostly upper lobes       - CT chest 12/03/10 acute gg changes and effusions c/w chf - PFT's  02/08/2018  FVC 0.64 (28 %)   with DLCO  78 % corrects to 147 % for alv volume      Rhematoid arthritis    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   SOB (shortness of breath) on exertion     Patient Active Problem List   Diagnosis Date Noted   Hematoma of left hip 10/10/2021   Periprosthetic fracture around internal prosthetic left hip joint ([place]) 10/06/2021   CAD in native artery    ILD (interstitial lung disease) ([place])    Periprosthetic fracture of femur following total replacement of hip    Fluid collection at surgical site    OA (osteoarthritis) of hip 09/16/2021   Primary osteoarthritis of left hip 09/16/2021   Acute on chronic respiratory failure with hypoxia and hypercapnia ([place]) 06/18/2021   Leukocytosis 06/18/2021   DNR (do not resuscitate) 06/18/2021   Persistent atrial fibrillation ([place]) 02/05/2021   Chronic heart failure with preserved ejection fraction ([place]) 01/07/2021   Stage 3b chronic kidney disease ([place]) 01/07/2021   Atrial fibrillation ([place]) 01/03/2021   Sciatica 12/27/2020   Diarrhea 12/27/2020   Atrial flutter ([place]) 12/25/2020   Secondary hypercoagulable state ([place]) 12/25/2020   Chronic respiratory failure with hypoxia and hypercapnia ([place]) 04/20/2019   Severe episode of recurrent major depressive  disorder, without psychotic features ([place]) 11/09/2018   COPD (chronic obstructive pulmonary disease) ([place]) 11/02/2018   COPD with acute exacerbation ([place]) 11/01/2018   CAP (community acquired pneumonia) 10/28/2018   ICH (intracerebral hemorrhage) ([place]) 08/25/2018   Hyperlipidemia 07/12/2015   S/P  CABG x 3 06/04/2015   Morbid (severe) obesity due to excess calories (HCC) 04/22/2015   Postoperative anemia due to acute blood loss 06/08/2013   History of home oxygen therapy 06/07/2013   GERD (gastroesophageal reflux disease) 06/07/2013   Barrett's esophagus 06/07/2013   OA (osteoarthritis) of knee 06/06/2013   COPD III spirometry if use FEV1/VC p saba  07/18/2010   PULMONARY FIBROSIS ILD POST INFLAMMATORY CHRONIC 07/18/2010   Compression fracture of thoracic vertebra (HCC) 07/02/2010   Hypothyroidism 04/20/2007   Essential hypertension 04/20/2007   PSORIATIC ARTHRITIS 04/20/2007    Past Surgical History:  Procedure Laterality Date   ABDOMINOPLASTY     CARDIAC CATHETERIZATION N/A 06/01/2015   Procedure: Left Heart Cath and Coronary Angiography;  Surgeon: Runell Gess, MD;  Location: [facility] INVASIVE CV LAB;  Service: Cardiovascular;  Laterality: N/A;   CARDIOVERSION N/A 02/12/2021   Procedure: CARDIOVERSION;  Surgeon: Meriam Sprague, MD;  Location: [facility] ENDOSCOPY;  Service: Cardiovascular;  Laterality: N/A;   CARPAL TUNNEL RELEASE     CHOLECYSTECTOMY     CORONARY ARTERY BYPASS GRAFT N/A 06/04/2015   Procedure: CORONARY ARTERY BYPASS GRAFT times three            with left internal mammary artery and right leg saphenous vein;  Surgeon: Alleen Borne, MD;  Location: [facility] OR;  Service: Open Heart Surgery;  Laterality: N/A;   cosmetic breast surgery     EYE SURGERY     cataract   INCISION AND DRAINAGE HIP Left 10/10/2021   Procedure: IRRIGATION AND DEBRIDEMENT HIP WITH WOUND VAC;  Surgeon: Ollen Gross, MD;  Location: [facility] ORS;  Service: Orthopedics;  Laterality: Left;   JOINT REPLACEMENT      KNEE ARTHROSCOPY Left    TEE WITHOUT CARDIOVERSION  06/04/2015   Procedure: TRANSESOPHAGEAL ECHOCARDIOGRAM (TEE);  Surgeon: Alleen Borne, MD;  Location: [facility] OR;  Service: Open Heart Surgery;;   TEE WITHOUT CARDIOVERSION N/A 02/12/2021   Procedure: TRANSESOPHAGEAL ECHOCARDIOGRAM (TEE);  Surgeon: Meriam Sprague, MD;  Location: [facility] ENDOSCOPY;  Service: Cardiovascular;  Laterality: N/A;   TOTAL HIP ARTHROPLASTY Left 09/16/2021   Procedure: TOTAL HIP ARTHROPLASTY ANTERIOR APPROACH;  Surgeon: Ollen Gross, MD;  Location: [facility] ORS;  Service: Orthopedics;  Laterality: Left;   TOTAL KNEE ARTHROPLASTY Left    TOTAL KNEE ARTHROPLASTY Right 06/06/2013   Procedure: RIGHT TOTAL KNEE ARTHROPLASTY;  Surgeon: Loanne Drilling, MD;  Location: [facility] ORS;  Service: Orthopedics;  Laterality: Right;     OB History   No obstetric history on file.     Family History  Problem Relation Age of Onset   Breast cancer Mother    Coronary artery disease Father    Rheum arthritis Father     Social History   Tobacco Use   Smoking status: Former    Packs/day: 1.50    Years: 58.50    Pack years: 87.75    Types: Cigarettes    Quit date: 03/03/2005    Years since quitting: 16.6   Smokeless tobacco: Never  Vaping Use   Vaping Use: Never used  Substance Use Topics   Alcohol use: No   Drug use: No    Home Medications Prior to Admission medications   Medication Sig Start Date End Date Taking? Authorizing Provider  acetaminophen (TYLENOL) 650 MG CR tablet Take 650 mg by mouth 2 (two) times daily as needed for pain.    [provider]  albuterol (VENTOLIN HFA) 108 (90 Base) MCG/ACT inhaler Inhale 2  puffs into the lungs every 4 (four) hours as needed (shortness of breath, if you can't catch your breath). 06/04/20   Tanda Rockers, MD  apixaban (ELIQUIS) 5 MG TABS tablet Take 1 tablet (5 mg total) by mouth 2 (two) times daily. 06/17/21   Fenton, Clint R, PA  atorvastatin (LIPITOR) 40 MG tablet Take 40  mg by mouth daily.    [provider]  budesonide (PULMICORT) 0.25 MG/2ML nebulizer solution One twice daily Patient taking differently: Take 0.25 mg by nebulization 2 (two) times daily. 07/02/20   Tanda Rockers, MD  CALCIUM PO Take 1 tablet by mouth daily.    [provider]  Cholecalciferol (VITAMIN D3) 2000 UNITS TABS Take 2,000 Units by mouth daily.    [provider]  diltiazem (CARDIZEM CD) 240 MG 24 hr capsule Take 1 capsule (240 mg total) by mouth daily. 09/11/21   Werner Lean, MD  dofetilide (TIKOSYN) 250 MCG capsule Take 1 capsule by mouth twice daily Patient taking differently: Take 250 mcg by mouth 2 (two) times daily. 08/20/21   Shirley Friar, PA-C  DULoxetine (CYMBALTA) 30 MG capsule Take 30 mg by mouth daily.    [provider]  ferrous sulfate 325 (65 FE) MG tablet Take 1 tablet (325 mg total) by mouth daily with breakfast. Patient taking differently: Take 325 mg by mouth at bedtime. 01/10/21 10/07/22  Rudean Haskell A, MD  formoterol (PERFOROMIST) 20 MCG/2ML nebulizer solution Take 2 mLs (20 mcg total) by nebulization 2 (two) times daily. Use in nebulizer twice daily perfectly regularly 07/02/20   Tanda Rockers, MD  golimumab (SIMPONI ARIA) 50 MG/4ML SOLN injection Inject 50 mg into the vein every 8 (eight) weeks.     [provider]  hydrocortisone 2.5 % cream Apply 1 application topically daily as needed for itching.    [provider]  hydroxypropyl methylcellulose / hypromellose (ISOPTO TEARS / GONIOVISC) 2.5 % ophthalmic solution Place 1 drop into both eyes 3 (three) times daily as needed for dry eyes.    [provider]  ketoconazole (NIZORAL) 2 % cream Apply 1 application topically daily as needed for irritation. 01/30/21   [provider]  leflunomide (ARAVA) 20 MG tablet Take 1 tablet (20 mg total) by mouth daily. 03/15/19   Isaac Bliss, Rayford Halsted, MD  levothyroxine  (SYNTHROID) 125 MCG tablet TAKE 1 TABLET BY MOUTH EVERY DAY Patient taking differently: Take 125 mcg by mouth daily before breakfast. 06/20/20   Isaac Bliss, Rayford Halsted, MD  linezolid (ZYVOX) 600 MG tablet Take 1 tablet (600 mg total) by mouth every 12 (twelve) hours. 10/16/21 11/15/21  Edmisten, Ok Anis, PA  losartan (COZAAR) 50 MG tablet Take 1 tablet (50 mg total) by mouth daily. 01/17/21   Werner Lean, MD  Magnesium Oxide 400 MG CAPS Take 1 capsule (400 mg total) by mouth daily. 06/06/21   Rudean Haskell A, MD  metoprolol tartrate (LOPRESSOR) 25 MG tablet Take 0.5 tablets (12.5 mg total) by mouth 2 (two) times daily. 10/16/21   Geradine Girt, DO  montelukast (SINGULAIR) 10 MG tablet Take 10 mg by mouth daily. 03/22/21   [provider]  nitroGLYCERIN (NITROSTAT) 0.4 MG SL tablet Place 1 tablet (0.4 mg total) under the tongue every 5 (five) minutes as needed for chest pain. 05/24/21   Werner Lean, MD  nystatin (MYCOSTATIN/NYSTOP) powder APPLY 1 APPLICATION        TOPICALLY 3 TIMES A DAY Patient taking  differently: Apply 1 application topically daily as needed (psoriasis). 03/07/21   Isaac Bliss, Rayford Halsted, MD  OXYGEN Inhale 2 L into the lungs continuous.    [provider]  pantoprazole (PROTONIX) 40 MG tablet Take 1 tablet (40 mg total) by mouth daily before breakfast. 01/10/21   Tanda Rockers, MD  pregabalin (LYRICA) 50 MG capsule Take 50 mg by mouth at bedtime. 03/13/21   [provider]  torsemide (DEMADEX) 20 MG tablet Take 1 tablet (20 mg total) by mouth 2 (two) times daily. 06/21/21   Mariel Aloe, MD  triamcinolone cream (KENALOG) 0.1 % Apply 1 application topically 2 (two) times daily as needed (for psoriasis).     [provider]  umeclidinium bromide (INCRUSE ELLIPTA) 62.5 MCG/INH AEPB Inhale 1 puff into the lungs daily. 03/14/21   Tanda Rockers, MD  vitamin B-12 (CYANOCOBALAMIN) 1000 MCG tablet Take 1,000 mcg by  mouth daily.    [provider]    Allergies    Gabapentin  Review of Systems   Review of Systems  All other systems reviewed and are negative. Ten systems reviewed and are negative for acute change, except as noted in the HPI.   Physical Exam Updated Vital Signs BP (!) 143/94    Pulse (!) 58    Temp 98.1 F (36.7 C) (Oral)    Resp (!) 21    SpO2 96%   Physical Exam Vitals and nursing note reviewed.  Constitutional:      General: She is not in acute distress.    Appearance: Normal appearance. She is well-developed. She is obese. She is not ill-appearing, toxic-appearing or diaphoretic.  HENT:     Head: Normocephalic and atraumatic.     Right Ear: External ear normal.     Left Ear: External ear normal.     Nose: Nose normal.     Mouth/Throat:     Mouth: Mucous membranes are moist.     Pharynx: Oropharynx is clear. No oropharyngeal exudate or posterior oropharyngeal erythema.  Eyes:     General: No scleral icterus.       Right eye: No discharge.        Left eye: No discharge.     Extraocular Movements: Extraocular movements intact.     Conjunctiva/sclera: Conjunctivae normal.  Cardiovascular:     Rate and Rhythm: Normal rate and regular rhythm.     Pulses: Normal pulses.     Heart sounds: Normal heart sounds. No murmur heard.   No friction rub. No gallop.  Pulmonary:     Effort: Pulmonary effort is normal. No respiratory distress.     Breath sounds: No stridor. Wheezing present. No decreased breath sounds, rhonchi or rales.     Comments: Expiratory wheezes noted bilaterally.  Oxygen saturations at 99% on 4 L via nasal cannula. Abdominal:     General: Abdomen is flat.     Palpations: Abdomen is soft.     Tenderness: There is no abdominal tenderness.     Comments: Protuberant abdomen that is soft and nontender.  Musculoskeletal:        General: Normal range of motion.     Cervical back: Normal range of motion and neck supple. No tenderness.     Comments: Trace  edema noted in the bilateral lower extremities.  Wound VAC in place to the left hip.  Mild surrounding erythema.  Skin:    General: Skin is warm and dry.  Neurological:     General:  No focal deficit present.     Mental Status: She is alert and oriented to person, place, and time.  Psychiatric:        Mood and Affect: Mood normal.        Behavior: Behavior normal.   ED Results / Procedures / Treatments   Labs (all labs ordered are listed, but only abnormal results are displayed) Labs Reviewed  COMPREHENSIVE METABOLIC PANEL - Abnormal; Notable for the following components:      Result Value   Potassium 5.4 (*)    Chloride 96 (*)    CO2 34 (*)    Glucose, Bld 138 (*)    BUN 54 (*)    Creatinine, Ser 2.08 (*)    Albumin 3.2 (*)    GFR, Estimated 25 (*)    All other components within normal limits  CBC WITH DIFFERENTIAL/PLATELET - Abnormal; Notable for the following components:   RBC 2.85 (*)    Hemoglobin 8.2 (*)    HCT 29.2 (*)    MCV 102.5 (*)    MCHC 28.1 (*)    RDW 16.7 (*)    Abs Immature Granulocytes 0.09 (*)    All other components within normal limits  BRAIN NATRIURETIC PEPTIDE - Abnormal; Notable for the following components:   B Natriuretic Peptide 287.0 (*)    All other components within normal limits  BLOOD GAS, VENOUS - Abnormal; Notable for the following components:   pH, Ven 7.176 (*)    pCO2, Ven 101 (*)    pO2, Ven 89.3 (*)    Bicarbonate 35.9 (*)    Acid-Base Excess 6.1 (*)    All other components within normal limits  BLOOD GAS, ARTERIAL - Abnormal; Notable for the following components:   pH, Arterial 7.180 (*)    pCO2 arterial 98.5 (*)    pO2, Arterial 202 (*)    Bicarbonate 35.4 (*)    Acid-Base Excess 5.9 (*)    All other components within normal limits  TROPONIN I (HIGH SENSITIVITY) - Abnormal; Notable for the following components:   Troponin I (High Sensitivity) 24 (*)    All other components within normal limits  RESP PANEL BY RT-PCR (FLU  A&B, COVID) ARPGX2  TROPONIN I (HIGH SENSITIVITY)    EKG EKG Interpretation  Date/Time:  Sunday October 20 2021 16:38:34 EST Ventricular Rate:  56 PR Interval:  227 QRS Duration: 108 QT Interval:  496 QTC Calculation: 479 R Axis:   79 Text Interpretation: Sinus rhythm Prolonged PR interval RSR' in V1 or V2, probably normal variant Inferior infarct, old No significant change since last tracing Confirmed by Wandra Arthurs [phone]) on 10/20/2021 4:47:10 PM  Radiology DG Chest Portable 1 View  Result Date: 10/20/2021 CLINICAL DATA:  Shortness of breath. EXAM: PORTABLE CHEST 1 VIEW COMPARISON:  Chest radiograph 10/07/2021 FINDINGS: Low lung volumes with central vascular congestion. Diffuse hazy interstitial thickening. Probable trace bilateral pleural effusions. No pneumothorax. Stable enlarged cardiac silhouette. Postoperative changes of median sternotomy and coronary artery bypass graft. IMPRESSION: Stable cardiomegaly with mild pulmonary edema and probable trace bilateral pleural effusions. Electronically Signed   By: Ileana Roup M.D.   On: 10/20/2021 16:42    Procedures .Critical Care Performed by: Rayna Sexton, PA-C Authorized by: Rayna Sexton, PA-C   Critical care provider statement:    Critical care time (minutes):  45   Critical care was necessary to treat or prevent imminent or life-threatening deterioration of the following conditions:  Respiratory failure   Critical care  was time spent personally by me on the following activities:  Development of treatment plan with patient or surrogate, discussions with consultants, evaluation of patient's response to treatment, examination of patient, ordering and review of laboratory studies, ordering and review of radiographic studies, ordering and performing treatments and interventions, pulse oximetry, re-evaluation of patient's condition and review of old charts   Medications Ordered in ED Medications  ipratropium-albuterol  (DUONEB) 0.5-2.5 (3) MG/3ML nebulizer solution 3 mL (3 mLs Nebulization Given 10/20/21 1725)  methylPREDNISolone sodium succinate (SOLU-MEDROL) 125 mg/2 mL injection 125 mg (125 mg Intravenous Given 10/20/21 1644)  magnesium sulfate IVPB 2 g 50 mL (0 g Intravenous Stopped 10/20/21 1916)  furosemide (LASIX) injection 40 mg (40 mg Intravenous Given 10/20/21 1747)   ED Course  I have reviewed the triage vital signs and the nursing notes.  Pertinent labs & imaging results that were available during my care of the patient were reviewed by me and considered in my medical decision making (see chart for details).  Clinical Course as of 10/20/21 1933  Nancy Fetter Oct 20, 2021  1719 VBG shows an acidosis with a pH of 7.176.  PCO2 of 101.  We will start patient on BiPAP.  This was discussed with the patient as well as her daughter who are amenable.  We will recheck ABG in about 1 hour.  Patient will be admitted. [LJ]  1726 Comprehensive metabolic panel(!) Acutely elevated creatinine and decreased GFR.  Likely mild AKI. [LJ]  1726 Troponin I (High Sensitivity)(!) Mildly elevated.  Similar to patient's baseline values.  We will obtain a second troponin. [LJ]  1739 Brain natriuretic peptide(!) Appears to be similar to patient's previous BNP values. [LJ]  [id number] Staff notified us that patient desaturated to the mid 80s on BiPAP.  Oxygen levels increased to 70% on BiPAP.  Patient's symptoms quickly improved and saturations now at 99% on BiPAP. [LJ]    Clinical Course User Index [LJ] Rayna Sexton, PA-C   MDM Rules/Calculators/A&P                          Pt is a 75 y.o. female who presents to the emergency department due to shortness of breath.  Labs: CBC with a hemoglobin of 8.2, MCV of 102.5, RDW of 16.7, absolute immature granulocytes 0.09. CMP with a potassium of 5.4, chloride 96, CO2 34, glucose of 138, BUN of 54, creatinine 2.08, albumin of 3.2, GFR 25. Troponin of 24. BNP of 287. VBG with a pH of  7.176 and a PCO2 of 101. ABG with a pH of 7.18 and a PCO2 of 98.5.  Imaging: Chest x-ray shows stable cardiomegaly with mild pulmonary edema and probable trace bilateral pleural effusions.  I, Rayna Sexton, PA-C, personally reviewed and evaluated these images and lab results as part of my medical decision-making.  Patient with a history of CHF as well as COPD.  Patient states she wears 2 to 3 L via nasal cannula at baseline.  Recent admission for an infection of the left hip status post recent arthroplasty.  Patient has a wound VAC in place and is on linezolid twice daily.  Shortness of breath started this afternoon.  Patient had a nebulizer just prior to EMS arrival with no improvement.  Patient started on 4 L via nasal cannula with EMS.  Patient then transition to BiPAP in the ED.  Patient started on Lasix, DuoNeb, Solu-Medrol, IV magnesium since arrival.  VBG with a pH of 7.176.  Repeat ABG shows a pH mildly improved at 7.18 after being on the BiPAP for about 1 hour.  Patient will require admission for further management.  This was discussed with the patient and she is amenable.    Patient discussed with the intensivist.  They will evaluate the patient in consult.  They recommend medicine admission.  Note: Portions of this report may have been transcribed using voice recognition software. Every effort was made to ensure accuracy; however, inadvertent computerized transcription errors may be present.   Final Clinical Impression(s) / ED Diagnoses Final diagnoses:  Shortness of breath   Rx / DC Orders ED Discharge Orders     None        Rayna Sexton, PA-C 10/20/21 1933    Drenda Freeze, MD 10/20/21 2306

## 2021-10-20 NOTE — ED Triage Notes (Signed)
EMS reports from home, had Hip surgery and DC home Wednesday, called out for SOB and cough with wheezing and weakness since last night. Hx of COPD and CHF. Normally on 2 ltrs O2 continuous. Pt states she took a neb treatment prior to EMS arrival without effect.  BP 151/79 HR 55 RR 20 Sp02 97 on 4 ltrs. Temp 98.6

## 2021-10-20 NOTE — Progress Notes (Signed)
Notified Lab that ABG being sent for analysis. 

## 2021-10-21 LAB — BASIC METABOLIC PANEL
Anion gap: 12 (ref 5–15)
BUN: 59 mg/dL — ABNORMAL HIGH (ref 8–23)
CO2: 33 mmol/L — ABNORMAL HIGH (ref 22–32)
Calcium: 9.4 mg/dL (ref 8.9–10.3)
Chloride: 94 mmol/L — ABNORMAL LOW (ref 98–111)
Creatinine, Ser: 1.82 mg/dL — ABNORMAL HIGH (ref 0.44–1.00)
GFR, Estimated: 29 mL/min — ABNORMAL LOW (ref >60)
Glucose, Bld: 153 mg/dL — ABNORMAL HIGH (ref 70–99)
Potassium: 4.8 mmol/L (ref 3.5–5.1)
Sodium: 139 mmol/L (ref 135–145)

## 2021-10-21 LAB — RESPIRATORY PANEL BY PCR

## 2021-10-21 LAB — BLOOD GAS, VENOUS
Acid-Base Excess: 8.8 mmol/L — ABNORMAL HIGH (ref 0.0–2.0)
Bicarbonate: 34.9 mmol/L — ABNORMAL HIGH (ref 20.0–28.0)
O2 Saturation: 82.8 %
Patient temperature: 98.6
pCO2, Ven: 63.5 mmHg — ABNORMAL HIGH (ref 44.0–60.0)
pH, Ven: 7.36 (ref 7.250–7.430)
pO2, Ven: 50.1 mmHg — ABNORMAL HIGH (ref 32.0–45.0)

## 2021-10-21 LAB — COMPREHENSIVE METABOLIC PANEL
ALT: 14 U/L (ref 0–44)
AST: 19 U/L (ref 15–41)
Albumin: 3 g/dL — ABNORMAL LOW (ref 3.5–5.0)
Alkaline Phosphatase: 70 U/L (ref 38–126)
Anion gap: 10 (ref 5–15)
BUN: 55 mg/dL — ABNORMAL HIGH (ref 8–23)
CO2: 32 mmol/L (ref 22–32)
Calcium: 9.5 mg/dL (ref 8.9–10.3)
Chloride: 96 mmol/L — ABNORMAL LOW (ref 98–111)
Creatinine, Ser: 2.01 mg/dL — ABNORMAL HIGH (ref 0.44–1.00)
GFR, Estimated: 26 mL/min — ABNORMAL LOW (ref >60)
Glucose, Bld: 131 mg/dL — ABNORMAL HIGH (ref 70–99)
Potassium: 5.7 mmol/L — ABNORMAL HIGH (ref 3.5–5.1)
Sodium: 138 mmol/L (ref 135–145)
Total Bilirubin: 0.7 mg/dL (ref 0.3–1.2)
Total Protein: 6.6 g/dL (ref 6.5–8.1)

## 2021-10-21 LAB — CBC
HCT: 25.3 % — ABNORMAL LOW (ref 36.0–46.0)
Hemoglobin: 7.5 g/dL — ABNORMAL LOW (ref 12.0–15.0)
MCH: 29 pg (ref 26.0–34.0)
MCHC: 29.6 g/dL — ABNORMAL LOW (ref 30.0–36.0)
MCV: 97.7 fL (ref 80.0–100.0)
Platelets: 174 10*3/uL (ref 150–400)
RBC: 2.59 MIL/uL — ABNORMAL LOW (ref 3.87–5.11)
RDW: 16.5 % — ABNORMAL HIGH (ref 11.5–15.5)
WBC: 3.7 10*3/uL — ABNORMAL LOW (ref 4.0–10.5)
nRBC: 0 % (ref 0.0–0.2)

## 2021-10-21 LAB — MRSA NEXT GEN BY PCR, NASAL: MRSA by PCR Next Gen: DETECTED — AB

## 2021-10-21 LAB — RESP PANEL BY RT-PCR (FLU A&B, COVID) ARPGX2
Influenza A by PCR: NEGATIVE
Influenza B by PCR: NEGATIVE
SARS Coronavirus 2 by RT PCR: POSITIVE — AB

## 2021-10-21 MED ORDER — THIAMINE HCL 100 MG PO TABS
100.0000 mg | ORAL_TABLET | Freq: Every day | ORAL | Status: DC
  Administered 2021-10-21 – 2021-10-25 (×5): 100 mg via ORAL
  Filled 2021-10-21 (×5): qty 1

## 2021-10-21 MED ORDER — ADULT MULTIVITAMIN W/MINERALS CH
1.0000 | ORAL_TABLET | Freq: Every day | ORAL | Status: DC
  Administered 2021-10-21 – 2021-10-25 (×5): 1 via ORAL
  Filled 2021-10-21 (×5): qty 1

## 2021-10-21 MED ORDER — ZINC SULFATE 220 (50 ZN) MG PO CAPS
220.0000 mg | ORAL_CAPSULE | Freq: Every day | ORAL | Status: DC
  Administered 2021-10-21 – 2021-10-25 (×5): 220 mg via ORAL
  Filled 2021-10-21 (×5): qty 1

## 2021-10-21 MED ORDER — CHLORHEXIDINE GLUCONATE CLOTH 2 % EX PADS
6.0000 | MEDICATED_PAD | Freq: Every day | CUTANEOUS | Status: AC
  Administered 2021-10-21 – 2021-10-24 (×5): 6 via TOPICAL

## 2021-10-21 MED ORDER — GUAIFENESIN-DM 100-10 MG/5ML PO SYRP: 10.0000 mL | ORAL_SOLUTION | ORAL | Status: DC | PRN

## 2021-10-21 MED ORDER — SODIUM CHLORIDE 0.9 % IV SOLN
200.0000 mg | Freq: Once | INTRAVENOUS | Status: AC
  Administered 2021-10-21: 12:00:00 200 mg via INTRAVENOUS
  Filled 2021-10-21 (×2): qty 40

## 2021-10-21 MED ORDER — IPRATROPIUM-ALBUTEROL 20-100 MCG/ACT IN AERS
1.0000 | INHALATION_SPRAY | Freq: Four times a day (QID) | RESPIRATORY_TRACT | Status: DC
  Administered 2021-10-22 (×4): 1 via RESPIRATORY_TRACT
  Filled 2021-10-21: qty 4

## 2021-10-21 MED ORDER — GUAIFENESIN 100 MG/5ML PO LIQD: 10.0000 mL | ORAL | Status: DC | PRN

## 2021-10-21 MED ORDER — DOFETILIDE 125 MCG PO CAPS
125.0000 ug | ORAL_CAPSULE | Freq: Two times a day (BID) | ORAL | Status: DC
  Administered 2021-10-21 – 2021-10-24 (×7): 125 ug via ORAL
  Filled 2021-10-21 (×7): qty 1

## 2021-10-21 MED ORDER — HYDROCODONE-ACETAMINOPHEN 5-325 MG PO TABS
1.0000 | ORAL_TABLET | Freq: Every day | ORAL | Status: DC | PRN
  Administered 2021-10-22: 1 via ORAL
  Filled 2021-10-21 (×2): qty 1

## 2021-10-21 MED ORDER — METHYLPREDNISOLONE SODIUM SUCC 125 MG IJ SOLR
94.0000 mg | Freq: Every day | INTRAMUSCULAR | Status: AC
  Administered 2021-10-21 – 2021-10-23 (×3): 94 mg via INTRAVENOUS
  Filled 2021-10-21 (×3): qty 2

## 2021-10-21 MED ORDER — TRAMADOL HCL 50 MG PO TABS
50.0000 mg | ORAL_TABLET | Freq: Every day | ORAL | Status: DC | PRN
  Administered 2021-10-21: 13:00:00 50 mg via ORAL
  Filled 2021-10-21: qty 1

## 2021-10-21 MED ORDER — ASCORBIC ACID 500 MG PO TABS
500.0000 mg | ORAL_TABLET | Freq: Every day | ORAL | Status: DC
  Administered 2021-10-21 – 2021-10-25 (×5): 500 mg via ORAL
  Filled 2021-10-21 (×5): qty 1

## 2021-10-21 MED ORDER — BENZONATATE 100 MG PO CAPS
100.0000 mg | ORAL_CAPSULE | Freq: Three times a day (TID) | ORAL | Status: DC | PRN
  Administered 2021-10-22: 100 mg via ORAL
  Filled 2021-10-21: qty 1

## 2021-10-21 MED ORDER — MUPIROCIN 2 % EX OINT
1.0000 "application " | TOPICAL_OINTMENT | Freq: Two times a day (BID) | CUTANEOUS | Status: DC
  Administered 2021-10-21 – 2021-10-24 (×9): 1 via NASAL
  Filled 2021-10-21: qty 22

## 2021-10-21 MED ORDER — SODIUM CHLORIDE 0.9 % IV SOLN
100.0000 mg | Freq: Every day | INTRAVENOUS | Status: DC
  Administered 2021-10-22 – 2021-10-23 (×2): 100 mg via INTRAVENOUS
  Filled 2021-10-21 (×2): qty 20

## 2021-10-21 MED ORDER — LOPERAMIDE HCL 2 MG PO CAPS
2.0000 mg | ORAL_CAPSULE | ORAL | Status: DC | PRN
  Administered 2021-10-21: 13:00:00 2 mg via ORAL
  Filled 2021-10-21: qty 1

## 2021-10-21 MED ORDER — PREDNISONE 50 MG PO TABS
50.0000 mg | ORAL_TABLET | Freq: Every day | ORAL | Status: DC
  Administered 2021-10-24 – 2021-10-25 (×2): 50 mg via ORAL
  Filled 2021-10-21 (×2): qty 1

## 2021-10-21 MED ORDER — ONDANSETRON HCL 4 MG/2ML IJ SOLN
4.0000 mg | Freq: Four times a day (QID) | INTRAMUSCULAR | Status: DC | PRN
  Filled 2021-10-21: qty 2

## 2021-10-21 MED ORDER — FOLIC ACID 1 MG PO TABS
1.0000 mg | ORAL_TABLET | Freq: Every day | ORAL | Status: DC
  Administered 2021-10-21 – 2021-10-25 (×5): 1 mg via ORAL
  Filled 2021-10-21 (×5): qty 1

## 2021-10-21 MED ORDER — METOPROLOL TARTRATE 5 MG/5ML IV SOLN
2.5000 mg | Freq: Four times a day (QID) | INTRAVENOUS | Status: DC | PRN
  Administered 2021-10-21 – 2021-10-22 (×3): 2.5 mg via INTRAVENOUS
  Filled 2021-10-21 (×3): qty 5

## 2021-10-21 NOTE — Plan of Care (Signed)
°  Problem: Education: Goal: Knowledge of General Education information will improve Description: Including pain rating scale, medication(s)/side effects and non-pharmacologic comfort measures Outcome: Not Progressing   Problem: Clinical Measurements: Goal: Diagnostic test results will improve Outcome: Not Progressing Goal: Respiratory complications will improve Outcome: Not Progressing   Problem: Nutrition: Goal: Adequate nutrition will be maintained Outcome: Not Progressing

## 2021-10-21 NOTE — Progress Notes (Addendum)
PROGRESS NOTE  [name] VOH:[id number] DOB: 12/16/1945 DOA: 10/20/2021 PCP: [name], [name], MD  HPI/Recap of past 24 hours: Delain Haagensen is a 75 y.o. 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  Work-up revealed COVID-19 viral pneumonia and acute on chronic hypoxic hypercarbic respiratory failure requiring BiPAP on admission.  Started on antiviral, IV remdesivir, IV Solu-Medrol, bronchodilators, pulmonary toilet.  10/21/2021: Patient seen and examined at her bedside.  Her daughter was present in the room.  States her breathing is improved.  Conversational dyspnea noted on exam.  + Loose stools, present on admission.    Assessment/Plan: Principal Problem:   Acute on chronic respiratory failure with hypoxia and hypercapnia (HCC) Active Problems:   Hypothyroidism   Essential hypertension   GERD (gastroesophageal reflux disease)   Barrett's esophagus   S/P CABG x 3   Hyperlipidemia   COPD with acute exacerbation (HCC)   Atrial fibrillation (HCC)   CAD in native artery   Acute renal failure superimposed on stage 3b chronic kidney disease (HCC)   CHF exacerbation (HCC)  Acute on chronic respiratory failure with hypoxia and hypercapnia, multifactorial secondary to COVID-19 viral infection, CHF exacerbation, COPD exacerbation Patient was hypoxic and requiring additional oxygen over her baseline 2 L that she is on chronically.   Was placed on BiPAP due to VBG with pH 7.17 and  PCO2 101.  Had mild improvement with repeat ABG on BiPAP to 7.18 and 98 respectively.   Confirmed with patient and family that she would like to be intubated if necessary to support her respiratory status.  She has partial code. Started antiviral IV remdesivir on 10/21/2021 Started IV Solu-Medrol on 10/21/2021 Bronchodilators, pulmonary toilet, antitussives Vitamin C, D3, zinc. Incentive spirometer, flutter valve Mobilize as tolerated. Continue diuresis and electrolyte replacement Wean off oxygen supplementation as tolerated. BiPAP nightly  Repeat VBG   AKI on CKD 3B Hyperkalemia Creatinine elevated to 2.08 from baseline of around 1.5 in the ED. Potassium mildly elevated at 5.4 in the ED. Hold off on losartan Monitor renal function Monitor urine output with strict I's and O's Repeat BMP this afternoon   Periprosthetic infection > Patient recently admitted earlier this month due to periprosthetic left hip fracture and infection in the setting of left hip replacement in November of this year. > Grew out MRSA at that time all cultures and had wound VAC placed and was put on linezolid. - Continue with wound VAC - Continue with linezolid twice daily Orthopedic consulted, will see in consultation   Anemia of chronic disease in the setting of CKD > Hemoglobin stable at 8.2 in the ED. Continue to monitor CBC  Permanent atrial fibrillation Continue home Tikosyn Resume home diltiazem and metoprolol  Continue home Eliquis Continue to closely monitor on telemetry   Hypertension - On Lasix as above - Holding home metoprolol and diltiazem in the setting of bradycardia as above.  Hypothyroidism - Continue home Synthroid  Depression - Continue home duloxetine    CAD Hyperlipidemia > History of NSTEMI and CABG x3 - Continue home atorvastatin -Continue home metoprolol as above  GERD Barrett's esophagus - Continue PPI   Critical care time: 65 minutes   DVT prophylaxis:       Eliquis  Code Status:              Partial.  Does not want to be shocked or have CPR or ACLS medications.  Is okay with being intubated if if needed.  Family Communication:      Updated her daughter at bedside.  Disposition Plan:              Patient is from:                        Home             Anticipated DC to:                   Home             Anticipated DC date:               2 to 5 days             Anticipated DC barriers:         None    Consults called:        Critical care consulted in the ED and will be following along  Admission status:     Inpatient, stepdown    Patient requires at least 2 midnights for further evaluation and treatment of present condition.    Objective: Vitals:   10/21/21 1138 10/21/21 1200 10/21/21 1400 10/21/21 1500  BP: (!) 159/80  (!) 162/42 103/85  Pulse: (!) 108  (!) 111 (!) 103  Resp: 19  (!) 31 (!) 24  Temp:  98.9 F (37.2 C)    TempSrc:  Oral    SpO2: 97%  94% 97%  Weight:        Intake/Output Summary (Last 24 hours) at 10/21/2021 1632 Last data filed at 10/21/2021 1500 Gross per 24 hour  Intake 534.76 ml  Output 500 ml  Net 34.76 ml   Filed Weights   10/21/21 0700  Weight: 91.7 kg    Exam:  General: 75 y.o. year-old female well developed well nourished in no acute distress.  Alert and oriented x3. Cardiovascular: Regular rate and rhythm with no rubs or gallops.  No thyromegaly or JVD noted.   Respiratory: Mild diffuse rales and wheezes. Good inspiratory effort. Abdomen: Soft nontender nondistended with normal bowel sounds x4 quadrants. Musculoskeletal: No lower extremity edema. 2/4 pulses in all 4 extremities. Skin: No ulcerative lesions noted or rashes, Psychiatry: Mood is appropriate for condition and setting   Data Reviewed: CBC: Recent Labs  Lab 10/16/21 0401 10/20/21 1645 10/21/21 0309  WBC 10.7* 9.2 3.7*  NEUTROABS  --  6.4  --   HGB 8.0* 8.2* 7.5*  HCT 28.1* 29.2* 25.3*  MCV 101.1* 102.5* 97.7  PLT  197 203 [id number]   Basic Metabolic Panel: Recent Labs  Lab 10/15/21 0406 10/16/21 0401 10/20/21 1645 10/20/21 2304 10/21/21 0309  NA 141 142 137  --  138  K 4.2 4.2 5.4*  --  5.7*  CL 96* 96* 96*  --  96*  CO2 37* 34* 34*  --  32  GLUCOSE 105* 124* 138*  --  131*  BUN 43* 42* 54*  --  55*  CREATININE 1.38* 1.51* 2.08*  --  2.01*  CALCIUM 8.8* 9.1 9.0  --  9.5  MG  --  1.8  --  5.9*  --   PHOS  --  3.9  --   --   --    GFR: Estimated Creatinine Clearance: 24.8 mL/min (A) (by C-G formula based on SCr of 2.01 mg/dL (H)). Liver Function Tests: Recent Labs  Lab 10/20/21 1645 10/21/21 0309  AST 18 19  ALT 13 14  ALKPHOS 81 70  BILITOT 0.5 0.7  PROT 7.1 6.6  ALBUMIN 3.2* 3.0*   No results for input(s): LIPASE, AMYLASE in the last 168 hours. No results for input(s): AMMONIA in the last 168 hours. Coagulation Profile: No results for input(s): INR, PROTIME in the last 168 hours. Cardiac Enzymes: No results for input(s): CKTOTAL, CKMB, CKMBINDEX, TROPONINI in the last 168 hours. BNP (last 3 results) Recent Labs    06/13/21 1303  PROBNP 588*   HbA1C: No results for input(s): HGBA1C in the last 72 hours. CBG: No results for input(s): GLUCAP in the last 168 hours. Lipid Profile: No results for input(s): CHOL, HDL, LDLCALC, TRIG, CHOLHDL, LDLDIRECT in the last 72 hours. Thyroid Function Tests: No results for input(s): TSH, T4TOTAL, FREET4, T3FREE, THYROIDAB in the last 72 hours. Anemia Panel: No results for input(s): VITAMINB12, FOLATE, FERRITIN, TIBC, IRON, RETICCTPCT in the last 72 hours. Urine analysis:    Component Value Date/Time   COLORURINE YELLOW 06/18/2021 0457   APPEARANCEUR HAZY (A) 06/18/2021 0457   LABSPEC 1.014 06/18/2021 0457   PHURINE 5.0 06/18/2021 0457   GLUCOSEU NEGATIVE 06/18/2021 0457   GLUCOSEU NEGATIVE 05/29/2021 1557   HGBUR NEGATIVE 06/18/2021 0457   HGBUR negative 07/18/2010 0941   BILIRUBINUR NEGATIVE 06/18/2021 0457   BILIRUBINUR n  08/10/2012 1131   KETONESUR NEGATIVE 06/18/2021 0457   PROTEINUR NEGATIVE 06/18/2021 0457   UROBILINOGEN 0.2 05/29/2021 1557   NITRITE NEGATIVE 06/18/2021 0457   LEUKOCYTESUR NEGATIVE 06/18/2021 0457   Sepsis Labs: @LABRCNTIP (procalcitonin:4,lacticidven:4)  ) Recent Results (from the past 240 hour(s))  Resp Panel by RT-PCR (Flu A&B, Covid) Nasopharyngeal Swab     Status: None   Collection Time: 10/12/21  1:40 PM   Specimen: Nasopharyngeal Swab; Nasopharyngeal(NP) swabs in vial transport medium  Result Value Ref Range Status   SARS Coronavirus 2 by RT PCR NEGATIVE NEGATIVE Final    Comment: (NOTE) SARS-CoV-2 target nucleic acids are NOT DETECTED.  The SARS-CoV-2 RNA is generally detectable in upper respiratory specimens during the acute phase of infection. The lowest concentration of SARS-CoV-2 viral copies this assay can detect is 138 copies/mL. A negative result does not preclude SARS-Cov-2 infection and should not be used as the sole basis for treatment or other patient management decisions. A negative result may occur with  improper specimen collection/handling, submission of specimen other than nasopharyngeal swab, presence of viral mutation(s) within the areas targeted by this assay, and inadequate number of viral copies(<138 copies/mL). A negative result must be combined with clinical observations, patient history, and epidemiological information. The expected result is Negative.  Fact Sheet for Patients:  EntrepreneurPulse.com.au  Fact Sheet for Healthcare Providers:  IncredibleEmployment.be  This test is no t yet approved or cleared by the [place] FDA and  has been authorized for detection and/or diagnosis of SARS-CoV-2 by FDA under an Emergency Use Authorization (EUA). This EUA will remain  in effect (meaning this test can be used) for the duration of the COVID-19 declaration under Section 564(b)(1) of the Act,  21 U.S.C.section 360bbb-3(b)(1), unless the authorization is terminated  or revoked sooner.       Influenza A  by PCR NEGATIVE NEGATIVE Final   Influenza B by PCR NEGATIVE NEGATIVE Final    Comment: (NOTE) The Xpert Xpress SARS-CoV-2/FLU/RSV plus assay is intended as an aid in the diagnosis of influenza from Nasopharyngeal swab specimens and should not be used as a sole basis for treatment. Nasal washings and aspirates are unacceptable for Xpert Xpress SARS-CoV-2/FLU/RSV testing.  Fact Sheet for Patients: EntrepreneurPulse.com.au  Fact Sheet for Healthcare Providers: IncredibleEmployment.be  This test is not yet approved or cleared by the [place] FDA and has been authorized for detection and/or diagnosis of SARS-CoV-2 by FDA under an Emergency Use Authorization (EUA). This EUA will remain in effect (meaning this test can be used) for the duration of the COVID-19 declaration under Section 564(b)(1) of the Act, 21 U.S.C. section 360bbb-3(b)(1), unless the authorization is terminated or revoked.  Performed at [facility], [place] [address] [place], [place] [address]   Resp Panel by RT-PCR (Flu A&B, Covid) Nasopharyngeal Swab     Status: Abnormal   Collection Time: 10/20/21  4:40 PM   Specimen: Nasopharyngeal Swab; Nasopharyngeal(NP) swabs in vial transport medium  Result Value Ref Range Status   SARS Coronavirus 2 by RT PCR POSITIVE (A) NEGATIVE Final    Comment: (NOTE) SARS-CoV-2 target nucleic acids are DETECTED.  The SARS-CoV-2 RNA is generally detectable in upper respiratory specimens during the acute phase of infection. Positive results are indicative of the presence of the identified virus, but do not rule out bacterial infection or co-infection with other pathogens not detected by the test. Clinical correlation with patient history and other diagnostic information is necessary to determine patient infection  status. The expected result is Negative.  Fact Sheet for Patients: EntrepreneurPulse.com.au  Fact Sheet for Healthcare Providers: IncredibleEmployment.be  This test is not yet approved or cleared by the [place] FDA and  has been authorized for detection and/or diagnosis of SARS-CoV-2 by FDA under an Emergency Use Authorization (EUA).  This EUA will remain in effect (meaning this test can be used) for the duration of  the COVID-19 declaration under Section 564(b)(1) of the A ct, 21 U.S.C. section 360bbb-3(b)(1), unless the authorization is terminated or revoked sooner.     Influenza A by PCR NEGATIVE NEGATIVE Final   Influenza B by PCR NEGATIVE NEGATIVE Final    Comment: (NOTE) The Xpert Xpress SARS-CoV-2/FLU/RSV plus assay is intended as an aid in the diagnosis of influenza from Nasopharyngeal swab specimens and should not be used as a sole basis for treatment. Nasal washings and aspirates are unacceptable for Xpert Xpress SARS-CoV-2/FLU/RSV testing.  Fact Sheet for Patients: EntrepreneurPulse.com.au  Fact Sheet for Healthcare Providers: IncredibleEmployment.be  This test is not yet approved or cleared by the [place] FDA and has been authorized for detection and/or diagnosis of SARS-CoV-2 by FDA under an Emergency Use Authorization (EUA). This EUA will remain in effect (meaning this test can be used) for the duration of the COVID-19 declaration under Section 564(b)(1) of the Act, 21 U.S.C. section 360bbb-3(b)(1), unless the authorization is terminated or revoked.  Performed at [facility], [place] [address], [place], [place] [address]   Respiratory (~20 pathogens) panel by PCR     Status: None   Collection Time: 10/20/21  8:02 PM   Specimen: Nasopharyngeal Swab; Respiratory  Result Value Ref Range Status   Adenovirus NOT DETECTED NOT DETECTED Final   Coronavirus 229E NOT  DETECTED NOT DETECTED Final    Comment: (NOTE) The Coronavirus on the Respiratory Panel, DOES  NOT test for the novel  Coronavirus (2019 nCoV)    Coronavirus HKU1 NOT DETECTED NOT DETECTED Final   Coronavirus NL63 NOT DETECTED NOT DETECTED Final   Coronavirus OC43 NOT DETECTED NOT DETECTED Final   Metapneumovirus NOT DETECTED NOT DETECTED Final   Rhinovirus / Enterovirus NOT DETECTED NOT DETECTED Final   Influenza A NOT DETECTED NOT DETECTED Final   Influenza B NOT DETECTED NOT DETECTED Final   Parainfluenza Virus 1 NOT DETECTED NOT DETECTED Final   Parainfluenza Virus 2 NOT DETECTED NOT DETECTED Final   Parainfluenza Virus 3 NOT DETECTED NOT DETECTED Final   Parainfluenza Virus 4 NOT DETECTED NOT DETECTED Final   Respiratory Syncytial Virus NOT DETECTED NOT DETECTED Final   Bordetella pertussis NOT DETECTED NOT DETECTED Final   Bordetella Parapertussis NOT DETECTED NOT DETECTED Final   Chlamydophila pneumoniae NOT DETECTED NOT DETECTED Final   Mycoplasma pneumoniae NOT DETECTED NOT DETECTED Final    Comment: Performed at [facility], [address] [place], [place] [address]  MRSA Next Gen by PCR, Nasal     Status: Abnormal   Collection Time: 10/20/21 10:58 PM   Specimen: Nasal Mucosa; Nasal Swab  Result Value Ref Range Status   MRSA by PCR Next Gen DETECTED (A) NOT DETECTED Final    Comment: RESULT CALLED TO, READ BACK BY AND VERIFIED WITH: WATSON,A. 10/21/21 @0145  BY SEEL,M. (NOTE) The GeneXpert MRSA Assay (FDA approved for NASAL specimens only), is one component of a comprehensive MRSA colonization surveillance program. It is not intended to diagnose MRSA infection nor to guide or monitor treatment for MRSA infections. Test performance is not FDA approved in patients less than 67 years old. Performed at [facility], [address], [place], [place]       Studies: DG Chest Portable 1 View  Result Date: 10/20/2021 CLINICAL DATA:   Shortness of breath. EXAM: PORTABLE CHEST 1 VIEW COMPARISON:  Chest radiograph 10/07/2021 FINDINGS: Low lung volumes with central vascular congestion. Diffuse hazy interstitial thickening. Probable trace bilateral pleural effusions. No pneumothorax. Stable enlarged cardiac silhouette. Postoperative changes of median sternotomy and coronary artery bypass graft. IMPRESSION: Stable cardiomegaly with mild pulmonary edema and probable trace bilateral pleural effusions. Electronically Signed   By: 14/03/2021 M.D.   On: 10/20/2021 16:42    Scheduled Meds:  apixaban  5 mg Oral BID   arformoterol  15 mcg Nebulization Q12H   vitamin C  500 mg Oral Daily   atorvastatin  40 mg Oral Daily   budesonide  0.25 mg Nebulization BID   chlorhexidine  15 mL Mouth Rinse BID   Chlorhexidine Gluconate Cloth  6 each Topical Q0600   dofetilide  125 mcg Oral BID   DULoxetine  30 mg Oral Daily   ferrous sulfate  325 mg Oral Q breakfast   folic acid  1 mg Oral Daily   furosemide  40 mg Intravenous BID   Ipratropium-Albuterol  1 puff Inhalation Q6H   levothyroxine  125 mcg Oral Q0600   linezolid  600 mg Oral Q12H   mouth rinse  15 mL Mouth Rinse q12n4p   methylPREDNISolone (SOLU-MEDROL) injection  94 mg Intravenous Daily   Followed by   10/22/2021 ON 10/24/2021] predniSONE  50 mg Oral Daily   montelukast  10 mg Oral Daily   multivitamin with minerals  1 tablet Oral Daily   mupirocin ointment  1 application Nasal BID   pantoprazole  40 mg Oral QAC breakfast  pregabalin  50 mg Oral QHS   sodium chloride flush  3 mL Intravenous Q12H   thiamine  100 mg Oral Daily   umeclidinium bromide  1 puff Inhalation Daily   zinc sulfate  220 mg Oral Daily    Continuous Infusions:  [START ON 10/22/2021] remdesivir 100 mg in NS 100 mL       LOS: 1 day     Kayleen Memos, MD [facility] Pager [phone]  If 7PM-7AM, please contact night-coverage [email or web address] Password [email or web address] 10/21/2021, 4:32 PM

## 2021-10-22 LAB — CBC
HCT: 26.2 % — ABNORMAL LOW (ref 36.0–46.0)
Hemoglobin: 7.7 g/dL — ABNORMAL LOW (ref 12.0–15.0)
MCH: 28.2 pg (ref 26.0–34.0)
MCHC: 29.4 g/dL — ABNORMAL LOW (ref 30.0–36.0)
MCV: 96 fL (ref 80.0–100.0)
Platelets: 183 10*3/uL (ref 150–400)
RBC: 2.73 MIL/uL — ABNORMAL LOW (ref 3.87–5.11)
RDW: 16.5 % — ABNORMAL HIGH (ref 11.5–15.5)
WBC: 5.7 10*3/uL (ref 4.0–10.5)
nRBC: 0 % (ref 0.0–0.2)

## 2021-10-22 LAB — BLOOD GAS, VENOUS
Acid-Base Excess: 11.9 mmol/L — ABNORMAL HIGH (ref 0.0–2.0)
Bicarbonate: 39 mmol/L — ABNORMAL HIGH (ref 20.0–28.0)
FIO2: 21
O2 Saturation: 26 %
Patient temperature: 98.6
pCO2, Ven: 74.9 mmHg (ref 44.0–60.0)
pH, Ven: 7.336 (ref 7.250–7.430)
pO2, Ven: <31 mmHg — CL (ref 32.0–45.0)

## 2021-10-22 LAB — BASIC METABOLIC PANEL
Anion gap: 8 (ref 5–15)
BUN: 62 mg/dL — ABNORMAL HIGH (ref 8–23)
CO2: 38 mmol/L — ABNORMAL HIGH (ref 22–32)
Calcium: 9.4 mg/dL (ref 8.9–10.3)
Chloride: 94 mmol/L — ABNORMAL LOW (ref 98–111)
Creatinine, Ser: 1.6 mg/dL — ABNORMAL HIGH (ref 0.44–1.00)
GFR, Estimated: 34 mL/min — ABNORMAL LOW (ref >60)
Glucose, Bld: 142 mg/dL — ABNORMAL HIGH (ref 70–99)
Potassium: 4.9 mmol/L (ref 3.5–5.1)
Sodium: 140 mmol/L (ref 135–145)

## 2021-10-22 LAB — PHOSPHORUS: Phosphorus: 4 mg/dL (ref 2.5–4.6)

## 2021-10-22 LAB — D-DIMER, QUANTITATIVE: D-Dimer, Quant: 1.85 ug/mL-FEU — ABNORMAL HIGH (ref 0.00–0.50)

## 2021-10-22 LAB — MAGNESIUM: Magnesium: 2.5 mg/dL — ABNORMAL HIGH (ref 1.7–2.4)

## 2021-10-22 LAB — C-REACTIVE PROTEIN: CRP: 1.3 mg/dL — ABNORMAL HIGH (ref <1.0)

## 2021-10-22 MED ORDER — IPRATROPIUM-ALBUTEROL 20-100 MCG/ACT IN AERS
1.0000 | INHALATION_SPRAY | Freq: Three times a day (TID) | RESPIRATORY_TRACT | Status: DC
  Administered 2021-10-23 – 2021-10-25 (×7): 1 via RESPIRATORY_TRACT

## 2021-10-22 NOTE — Consult Note (Signed)
[facility] Inpatient Consult   10/22/2021  Malory Spurr [birth year] [id number]  [facility] Care Management [facility] CM)  Patient screened for less than 30 days readmission hospitalization with noted high risk score for unplanned readmission. Assess for [facility] CM service needs for post hospital transition.  Review of patient's medical record shows patient is active with embedded chronic care management (CCM) team at primary provider office.  Plan:  Continue to follow progress and disposition to assess for post hospital care management needs.    Of note, [facility] Care Management services does not replace or interfere with any services that are arranged by inpatient case management or social work.   Christophe Louis, MSN, RN [facility] [employer] [phone]  Toll free office [phone]

## 2021-10-22 NOTE — Evaluation (Addendum)
Occupational Therapy Evaluation °Patient Details °Name: [name] °MRN: [id number] °DOB: 03/05/1946 °Today's Date: 10/22/2021 ° ° °History of Present Illness Pt is a 75 y.o. female s/p Lt THA on 09/16/21. PMH including but not limited to CHF, CKD III, CAD s/p CABG (2016), COPD on 2L O2 at baseline, OA, hypothyroidism, NSTEMI, obesity, HLD, depression, L TKA, and R TKA  . Patient  presented to [facility] on 10/[facility]/2021 with increasing left hip pain.  CT of the left hip concerning for periprosthetic intertrochanteric left femur fracture and periprosthetic fluid collections. Transfer to [facility]  was admitted 10/06/21  for Periprosthetic hip fracture with fluid collection.  She underwent irrigation and debridement of left hip with wound VAC on 12/8.  Pt readmitted on 10/20/21 due to worsening shortness of breath and chest pain, found to be COVID+. Pt remains TDWB to LLE.  ° °Clinical Impression °  °Patient is currently requiring assistance with ADLs including maximum assist with toileting (currently using purewick), total assist with seated LE dressing, maximum assist with LB bathing, and setup assist with seated UE bathing/dressing and grooming.  Current level of function is below patient's typical baseline.  During this evaluation, patient was limited by generalized weakness, impaired activity tolerance, and TDWB restrictions to LLE, all of which has the potential to impact patient's safety and independence during functional mobility, as well as performance for ADLs.  Patient lives with her daughter, who is able to provide 24/7 supervision and assistance.  Patient demonstrates good rehab potential, and should benefit from continued skilled occupational therapy services while in acute care to maximize safety, independence and quality of life at home.  Continued occupational therapy services in a SNF setting prior to return home is recommended. ° °? °  °   ° °Recommendations for follow up therapy are one  component of a multi-disciplinary discharge planning process, led by the attending physician.  Recommendations may be updated based on patient status, additional functional criteria and insurance authorization.  ° °Follow Up Recommendations ° Skilled nursing-short term rehab (<3 hours/day)  °  °Assistance Recommended at Discharge Frequent or constant Supervision/Assistance  °Functional Status Assessment ° Patient has had a recent decline in their functional status and demonstrates the ability to make significant improvements in function in a reasonable and predictable amount of time.  °Equipment Recommendations ° Other (comment) (Adaptive equipment/Hip kit)  °  °Recommendations for Other Services   ° ° °  °Precautions / Restrictions Precautions °Precautions: Fall °Restrictions °Weight Bearing Restrictions: Yes °LLE Weight Bearing: Touchdown weight bearing  ° °  ° °Mobility Bed Mobility °Overal bed mobility: Needs Assistance °  °  °  °Supine to sit: Min guard;HOB elevated °  °  °General bed mobility comments: Min guard to begin LLE movement to EOB then pt able to move without assistance °  ° °Transfers °Overall transfer level: Needs assistance °Equipment used: Rolling walker (2 wheels) °Transfers: Sit to/from Stand °Sit to Stand: Mod assist °Stand pivot transfers: Mod assist °  °  °  °  °General transfer comment: Pt used RW with cues for TDWB to LLE and pivoted to recliner with increased time/effort, unable to step or hop with RT foot, but able to pivot. Cues for proximity to walker to allow UEs to assist. °  ° °  °Balance Overall balance assessment: Needs assistance °Sitting-balance support: No upper extremity supported;Feet supported °Sitting balance-[name]: Good °  °  °Standing balance support: Bilateral upper extremity supported;Reliant on assistive device for balance;During functional   activity Standing balance-[name]: Poor                             ADL either performed or assessed  with clinical judgement   ADL Overall ADL's : Needs assistance/impaired Eating/Feeding: Independent   Grooming: Set up;Sitting   Upper Body Bathing: Set up;Sitting   Lower Body Bathing: Sitting/lateral leans;Maximal assistance   Upper Body Dressing : Set up;Sitting   Lower Body Dressing: Maximal assistance Lower Body Dressing Details (indicate cue type and reason): Pt attempted to don RT sock while long sitting in bed but unable. Pt has no adaptive equipment at home but would benefit from instruction.   Toilet Transfer Details (indicate cue type and reason): Pt pivoted to Recliner. Please see Mobility section. Toileting- Clothing Manipulation and Hygiene: Maximal assistance;Sitting/lateral lean Toileting - Clothing Manipulation Details (indicate cue type and reason): Pt using pure wick. Has been requiring assistance for hygiene since home. Has bidet at home but not hooked up yet.     Functional mobility during ADLs: Moderate assistance;+2 for safety/equipment;Cueing for sequencing;Rolling walker (2 wheels) General ADL Comments: Increased time/effort for all mobility. Cues for rest as needed per SpO2 demands.     Vision   Vision Assessment?: No apparent visual deficits     Perception Perception Perception: Within Functional Limits   Praxis Praxis Praxis: Intact    Pertinent Vitals/Pain Pain Assessment: PAINAD Breathing: occasional labored breathing, short period of hyperventilation Negative Vocalization: none Facial Expression: smiling or inexpressive Body Language: tense, distressed pacing, fidgeting Consolability: no need to console PAINAD Score: 2 Pain Intervention(s): Limited activity within patient's tolerance;Monitored during session;Premedicated before session;Repositioned     Hand Dominance Right   Extremity/Trunk Assessment Upper Extremity Assessment Upper Extremity Assessment: Overall WFL for tasks assessed;RUE deficits/detail RUE Deficits / Details:  Guarding with shoulder MMT due to radiating hip pain. But still functional.   Lower Extremity Assessment Lower Extremity Assessment: Defer to PT evaluation   Cervical / Trunk Assessment Cervical / Trunk Assessment: Normal   Communication Communication Communication: No difficulties   Cognition Arousal/Alertness: Awake/alert Behavior During Therapy: WFL for tasks assessed/performed Overall Cognitive Status: Within Functional Limits for tasks assessed                                       General Comments       Exercises     Shoulder Instructions      Home Living Family/patient expects to be discharged to:: Private residence Living Arrangements: Children;Other relatives Available Help at Discharge: Family;Available 24 hours/day Type of Home: House Home Access: Ramped entrance     Home Layout: Multi-level;Able to live on main level with bedroom/bathroom     Bathroom Shower/Tub: Occupational psychologist: Standard Bathroom Accessibility: Yes   Home Equipment: Air cabin crew (4 wheels);Rolling Walker (2 wheels);Grab bars - toilet;Grab bars - tub/shower;Cane - quad;Transport chair;BSC/3in1   Additional Comments: uses 2L O2 at home throughout day, has bidet but still in box. Sleeps in adjustable bed      Prior Functioning/Environment Prior Level of Function : Needs assist       Physical Assist : Mobility (physical);ADLs (physical) Mobility (physical): Transfers;Bed mobility ADLs (physical): Bathing;Dressing;Toileting;IADLs Mobility Comments: since DC home 11/21, has been pivoting to bed/transport chair, attempting  some ambulation. ADLs Comments: Pt reports assistance since her THA with LE dressing, bathing and  hygiene after using the bathroom. °  ° °  °  °OT Problem List: Decreased activity tolerance;Impaired balance (sitting and/or standing);Decreased knowledge of use of DME or AE;Obesity;Decreased strength;Cardiopulmonary status limiting  activity °  °   °OT Treatment/Interventions: Self-care/ADL training;Therapeutic exercise;Therapeutic activities;Energy conservation;Patient/family education;DME and/or AE instruction;Balance training  °  °OT Goals(Current goals can be found in the care plan section) Acute Rehab OT Goals °Patient Stated Goal: To find help for daughter who cares for pt. Decrease burden on daughter, °OT Goal Formulation: With patient °Time For Goal Achievement: 11/05/21 °Potential to Achieve Goals: Good °ADL Goals °Pt Will Perform Lower Body Bathing: with adaptive equipment;sitting/lateral leans;with supervision °Pt Will Perform Lower Body Dressing: with adaptive equipment;with supervision;with set-up;sitting/lateral leans;sit to/from stand;bed level °Pt Will Transfer to Toilet: with modified independence;stand pivot transfer;bedside commode °Pt Will Perform Toileting - Clothing Manipulation and hygiene: with adaptive equipment;with caregiver independent in assisting;with modified independence (Caregiver will state plan for installation of bidet to increase pt's independence.) °Pt/caregiver will Perform Home Exercise Program: Increased strength;Both right and left upper extremity;With theraband;Independently  °OT Frequency: Min 2X/week °  °Barriers to D/C:   ° °   ° ° °   °Co-evaluation   °  °  °  °  ° °  °AM-PAC OT "6 Clicks" Daily Activity     °Outcome Measure Help from another person eating meals?: None °Help from another person taking care of personal grooming?: A Little °Help from another person toileting, which includes using toliet, bedpan, or urinal?: A Lot °Help from another person bathing (including washing, rinsing, drying)?: A Lot °Help from another person to put on and taking off regular upper body clothing?: A Little °Help from another person to put on and taking off regular lower body clothing?: Total °6 Click Score: 15 °  °End of Session Equipment Utilized During Treatment: Gait belt;Rolling walker (2  wheels);Oxygen °Nurse Communication: Mobility status;Precautions ° °Activity Tolerance: Patient tolerated treatment well;Patient limited by fatigue °Patient left: in chair;with call bell/phone within reach ° °OT Visit Diagnosis: Unsteadiness on feet (R26.81);Muscle weakness (generalized) (M62.81)  °              °Time: 1053-1144 °OT Time Calculation (min): 51 min °Charges:  OT General Charges °$OT Visit: 1 Visit °OT Evaluation °$OT Eval Moderate Complexity: 1 Mod °OT Treatments °$Self Care/Home Management : 8-22 mins °$Therapeutic Activity: 8-22 mins ° °, OT °Acute Rehab Services °Office: [phone] °10/22/2021 ° °   °10/22/2021, 12:55 PM °

## 2021-10-22 NOTE — Plan of Care (Signed)
Discussed with daughter and patient plan of care for the evening, pain management and blood pressure medications with some teach back displayed.  Daughter who has recently test COVID positive went home for the night and nurses numebr given to her to contact for updates with her mom overnight.  Problem: Education: Goal: Knowledge of General Education information will improve Description: Including pain rating scale, medication(s)/side effects and non-pharmacologic comfort measures Outcome: Progressing   Problem: Health Behavior/Discharge Planning: Goal: Ability to manage health-related needs will improve Outcome: Progressing

## 2021-10-22 NOTE — Progress Notes (Signed)
Subjective:    [name] reports pain as mild.  [name] seen in rounds for Dr. Wynelle Link. [name] is feeling well in regards to the left hip. O2 sats dropped slightly while conversing with the [name], encouraged deep breathing. Bumped back to high 90s with focused inspiration.  Objective: Vital signs in last 24 hours: Temp:  [97.1 F (36.2 C)-98.2 F (36.8 C)] 97.9 F (36.6 C) (12/20 1643) Pulse Rate:  [68-102] 87 (12/20 1200) Resp:  [16-29] 19 (12/20 1201) BP: (131-171)/(59-105) 131/65 (12/20 1200) SpO2:  [92 %-100 %] 100 % (12/20 1245) FiO2 (%):  [30 %] 30 % (12/20 0321) Weight:  [91.6 kg-97.5 kg] 97.5 kg (12/20 0500)  Intake/Output from previous day:  Intake/Output Summary (Last 24 hours) at 10/22/2021 1757 Last data filed at 10/22/2021 1728 Gross per 24 hour  Intake 791.21 ml  Output 2200 ml  Net -1408.79 ml    Intake/Output this shift: Total I/O In: 541.2 [P.O.:440; I.V.:3; IV Piggyback:98.2] Out: 1400 [Urine:1400]  Labs: Recent Labs    10/20/21 1645 10/21/21 0309 10/22/21 0712  HGB 8.2* 7.5* 7.7*   Recent Labs    10/21/21 0309 10/22/21 0712  WBC 3.7* 5.7  RBC 2.59* 2.73*  HCT 25.3* 26.2*  PLT 174 183   Recent Labs    10/21/21 1706 10/22/21 0712  NA 139 140  K 4.8 4.9  CL 94* 94*  CO2 33* 38*  BUN 59* 62*  CREATININE 1.82* 1.60*  GLUCOSE 153* 142*  CALCIUM 9.4 9.4   No results for input(s): LABPT, INR in the last 72 hours.  Exam: General - [name] is Alert and Oriented Extremity - Neurologically intact Neurovascular intact Sensation intact distally Dorsiflexion/Plantar flexion intact Dressing/Incision - Woundvac removed. Incision is clean, dry, and intact. No erythema or drainage. Staples in place. Fresh gauze and paper tape dressing applied. Motor Function - intact, moving foot and toes well on exam.   Past Medical History:  Diagnosis Date   Anemia    Arthritis    OA RIGHT KNEE WITH PAIN   Barrett esophagus    BiPAP (biphasic  positive airway pressure)    Wears at night   Bradycardia 06/01/2015   CAD in native artery    a. NSTEMI 05/2015 s/p emergent CABG.   Chronic diastolic (congestive) heart failure (HCC)    Chronic kidney disease, stage 3a (HCC)    Chronic respiratory failure (HCC)    Chronic respiratory failure with hypoxia and hypercapnia ([place]) 02/04/2010   Followed in Pulmonary clinic/ [facility]/ [facility]       - 02 dependent  since 07/02/10 >>  83% RA December 05, 2010       - ONO RA 08/05/12  :  Positive sat < 89 x 2:55m> repeat on 2lpm rec 08/12/2012  - 06/17/2013 reported desat with activity p Knee surgery > rec restart 2lpm with activity  - 06/27/2013   Walked 2lpm  x one lap @ 185 stopped due to sat 88% not sob , desat to 82% on RA just at th   COPD III spirometry if use FEV1/VC p saba  07/18/2010   Quit smoking May 2006       - PFT's  04/12/10 FEV1  1.21 (69%) ratio 77 and no change p B2,  DLC0 56%   VC 70%         - PFTs  08/08/2013 FEV1 1.21 (60%) ratio 86 and no change p B2 DLCO 79%  VC 72%  On symbicort 160 2bid  -  PFT's  02/08/2018  FEV1 0.70 (40 % ) ratio 56 if use FEV1/VC  p 38 % improvement from saba p symb 160 prior to study with DLCO  78 % corrects to 147  % for alv volume   - 02/08/2018   Cough variant asthma 02/26/2011   Followed in Pulmonary clinic/ [facility]/ [facility]  - PFT's  06/04/15  FEV1 1.20 (67 % ) ratio 83  p 6 % improvement from saba with DLCO  80 % corrects to 132 % for alv volume      - Clinical dx based on response to symbicort       FENO 09/16/2016  =   96 on symbicort 160 2bid > added singulair  Allergy profile 09/16/2016 >  Eos 0.5 /  IgE  78 neg RAST  -  Referred to rehab 04/29/2017 > completed   Depression    Dysrhythmia    Afib   Essential hypertension 04/20/2007   Qualifier: Diagnosis of  By: Marcelyn Ditty, RN, Katy Fitch    GERD (gastroesophageal reflux disease)    History of ARDS 2006   History of home oxygen therapy    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 small intracranial hemorrhage in setting of HTN.   Mild carotid artery disease (HCC)    a. Duplex 1-39% bilaterally in 2016.   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   Followed as Primary Care [name]/ [facility]/ [facility]  -s/p ARDS 2006 with bacteremic S  Pna       - CT chest 07/03/10 Nonspecific PF mostly upper lobes       - CT chest 12/03/10 acute gg changes and effusions c/w chf - PFT's  02/08/2018  FVC 0.64 (28 %)   with DLCO  78 % corrects to 147 % for alv volume      Rhematoid arthritis    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   SOB (shortness of breath) on exertion     Assessment/Plan:    Principal Problem:   Acute on chronic respiratory failure with hypoxia and hypercapnia (HCC) Active Problems:   Hypothyroidism   Essential hypertension   GERD (gastroesophageal reflux disease)   Barrett's esophagus   S/P CABG x 3   Hyperlipidemia   COPD with acute exacerbation (HCC)   Atrial fibrillation (HCC)   CAD in native artery   Acute renal failure superimposed on stage 3b chronic kidney disease (HCC)   CHF exacerbation (HCC)  Estimated body mass index is 43.41 kg/m as calculated from the following:   Height as of this encounter: 4\' 11"  (1.499 m).   Weight as of this encounter: 97.5 kg.  TDWB to the LLE Woundvac removed today, incision looks great with no erythema or drainage. Ortho will change out gauze dressing. Continue linezolid.  , PA-C Orthopedic Surgery [phone] 10/22/2021, 5:57 PM

## 2021-10-22 NOTE — Progress Notes (Addendum)
°  Transition of Care [facility]) Screening Note   Patient Details  Name: [name] Date of Birth: 26-Dec-1945   Transition of Care [facility]) CM/SW Contact:    Eneida Evers, Meriam Sprague, RN Phone Number: 10/22/2021, 12:45 PM    Transition of Care Department [facility]) has reviewed patient and no TOC needs have been identified at this time. We will continue to monitor patient advancement through interdisciplinary progression rounds. If new patient transition needs arise, please place a TOC consult.   30 Day Unplanned Readmission Risk Score    Flowsheet Row ED to Hosp-Admission (Current) from 10/20/2021 in [facility]-ICU/STEPDOWN  30 Day Unplanned Readmission Risk Score (%) 33.64 Filed at 10/23/2021 0801       This score is the patient's risk of an unplanned readmission within 30 days of being discharged (0 -100%). The score is based on dignosis, age, lab data, medications, orders, and past utilization.   Low:  0-14.9   Medium: 15-21.9   High: 22-29.9   Extreme: 30 and above         Readmission Risk Prevention Plan 10/23/2021 06/21/2021  Transportation Screening Complete Complete  PCP or Specialist Appt within 5-7 Days - Complete  Home Care Screening - Complete  Medication Review (RN CM) - Complete  Medication Review [facility]) Complete -  PCP or Specialist appointment within 3-5 days of discharge Complete -  HRI or Home Care Consult Complete -  SW Recovery Care/Counseling Consult Complete -  Palliative Care Screening Not Applicable -  Skilled Nursing Facility Not Applicable -  Some recent data might be hidden

## 2021-10-22 NOTE — Progress Notes (Signed)
PROGRESS NOTE  [name] [id number] DOB: 1946-09-17 DOA: 10/20/2021 PCP: Isaac Bliss, Rayford Halsted, MD  HPI/Recap of past 24 hours: [name] is a 75 y.o. 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  Work-up revealed COVID-19 viral pneumonia and acute on chronic hypoxic hypercarbic respiratory failure requiring BiPAP on admission and at night while inpatient.  Started on antiviral, IV remdesivir, IV Solu-Medrol, bronchodilators, pulmonary toilet with improvement of her symptomatology.  10/22/2021: Seen at her bedside.  She still has conversational dyspnea.  Endorses dyspnea with minimal exertion.  VBG done this morning showing PCO2 74 from 63 with pH of 7.336.  Patient will require NIV/trilogy prior to discharge.    Assessment/Plan: Principal Problem:   Acute on chronic respiratory failure with hypoxia and hypercapnia (HCC) Active Problems:   Hypothyroidism   Essential hypertension   GERD (gastroesophageal reflux disease)   Barrett's esophagus   S/P CABG x 3   Hyperlipidemia   COPD with acute exacerbation (HCC)   Atrial fibrillation (HCC)   CAD in native artery   Acute renal failure superimposed on stage 3b chronic kidney disease (HCC)   CHF exacerbation (HCC)  Acute on chronic respiratory failure with hypoxia and hypercapnia, multifactorial secondary to COVID-19 viral infection, CHF exacerbation, COPD exacerbation Patient  presented hypoxic and requiring additional oxygen over her baseline 2 L that she is on chronically.   Was placed on BiPAP due to pH 7.17 and PCO2 101 on VBG.  Had mild improvement with repeat ABG on BiPAP to 7.18 and 98 respectively.   Started antiviral IV remdesivir on 10/21/2021, continue Started IV Solu-Medrol on 10/21/2021, continue Continue bronchodilators, pulmonary toilet, antitussives Continue Vitamin C, D3, zinc. Continue continue to incentive spirometer, flutter valve Mobilize as tolerated. Continue diuresis and electrolyte replacement Continue to wean off oxygen supplementation as tolerated. Continue BiPAP nightly  Patient will benefit from NIV/trilogy prior to DC.  TOC consulted to assist with arrangement of NIV. Patient follows with pulmonary Dr. Melvyn Novas.   Improving, AKI on CKD 3B Hyperkalemia, resolved. Presented with creatinine elevated to 2.08 from baseline of around 1.5 in the ED. Potassium mildly elevated at 5.4 in the ED. Creatinine is downtrending Continue to hold off on losartan Continue to monitor renal function Continue to monitor urine output with strict I's and O's Repeat BMP this afternoon   Periprosthetic infection > Patient recently admitted earlier this month due to periprosthetic left hip fracture and infection in the setting of left hip replacement in November of this year. > Grew out MRSA at that time all cultures and had wound VAC placed and was put on linezolid. - Wound VAC removed by orthopedic surgery on 10/22/2021.  Seen by orthopedic surgery. - Continue with linezolid twice daily   Anemia of chronic disease in the setting of CKD > Hemoglobin stable at 8.2 in the ED. Continue to monitor CBC  Permanent atrial fibrillation Continue home Tikosyn Continue home diltiazem and metoprolol  Continue home Eliquis Continue to closely monitor on telemetry   Hypertension - On Lasix as above, continue - Holding home  metoprolol and diltiazem in the  setting of bradycardia as above.  Hypothyroidism - Continue home Synthroid  Depression - Continue home duloxetine    CAD Hyperlipidemia > History of NSTEMI and CABG x3 - Continue home atorvastatin -Continue home metoprolol as above   GERD Barrett's esophagus - Continue PPI   Critical care time: 65 minutes   DVT prophylaxis:      Eliquis  Code Status:              Partial.  Does not want to be shocked or have CPR or ACLS medications.  Is okay with being intubated if if needed.  Family Communication:      Updated her daughter at bedside.  Disposition Plan:              Patient is from:                        Home             Anticipated DC to:                   Home             Anticipated DC date:               2 to 5 days             Anticipated DC barriers:         None    Consults called:        Critical care consulted in the ED and will be following along  Admission status:     Inpatient, stepdown    Patient requires at least 2 midnights for further evaluation and treatment of present condition.    Objective: Vitals:   10/22/21 1159 10/22/21 1200 10/22/21 1201 10/22/21 1245  BP: 131/65 131/65    Pulse: 94 87    Resp: 20 (!) 29 19   Temp: 98 F (36.7 C)     TempSrc: Oral     SpO2: 100% 100%  100%  Weight:      Height:        Intake/Output Summary (Last 24 hours) at 10/22/2021 1553 Last data filed at 10/22/2021 1355 Gross per 24 hour  Intake 693 ml  Output 2550 ml  Net -1857 ml   Filed Weights   10/21/21 0700 10/21/21 2040 10/22/21 0500  Weight: 91.7 kg 91.6 kg 97.5 kg    Exam:  General: 75 y.o. year-old female well-developed well-nourished in no acute distress.  She is alert and oriented times 3.   Cardiovascular: Regular rate and rhythm no rubs or gallops. Respiratory: Mild rales at bases with no wheezing noted.  Good inspiratory effort.   Abdomen: Soft nontender normal bowel sounds present.   Musculoskeletal: No lower extremity edema  bilaterally.   Skin: No ulcerative lesions noted. Psychiatry: Mood is appropriate for condition and setting.   Data Reviewed: CBC: Recent Labs  Lab 10/16/21 0401 10/20/21 1645 10/21/21 0309 10/22/21 0712  WBC 10.7* 9.2 3.7* 5.7  NEUTROABS  --  6.4  --   --   HGB 8.0* 8.2* 7.5* 7.7*  HCT 28.1* 29.2* 25.3* 26.2*  MCV 101.1* 102.5* 97.7 96.0  PLT 197 203 174 [id number]   Basic Metabolic Panel: Recent Labs  Lab 10/16/21 0401 10/20/21 1645 10/20/21 2304 10/21/21 0309 10/21/21 1706 10/22/21 0712  NA 142 137  --  138 139 140  K 4.2 5.4*  --  5.7* 4.8 4.9  CL 96* 96*  --  96* 94* 94*  CO2 34* 34*  --  32 33* 38*  GLUCOSE 124* 138*  --  131* 153* 142*  BUN 42* 54*  --  55* 59* 62*  CREATININE 1.51* 2.08*  --  2.01* 1.82* 1.60*  CALCIUM 9.1 9.0  --  9.5 9.4 9.4  MG 1.8  --  5.9*  --   --  2.5*  PHOS 3.9  --   --   --   --  4.0   GFR: Estimated Creatinine Clearance: 31.6 mL/min (A) (by C-G formula based on SCr of 1.6 mg/dL (H)). Liver Function Tests: Recent Labs  Lab 10/20/21 1645 10/21/21 0309  AST 18 19  ALT 13 14  ALKPHOS 81 70  BILITOT 0.5 0.7  PROT 7.1 6.6  ALBUMIN 3.2* 3.0*   No results for input(s): LIPASE, AMYLASE in the last 168 hours. No results for input(s): AMMONIA in the last 168 hours. Coagulation Profile: No results for input(s): INR, PROTIME in the last 168 hours. Cardiac Enzymes: No results for input(s): CKTOTAL, CKMB, CKMBINDEX, TROPONINI in the last 168 hours. BNP (last 3 results) Recent Labs    06/13/21 1303  PROBNP 588*   HbA1C: No results for input(s): HGBA1C in the last 72 hours. CBG: No results for input(s): GLUCAP in the last 168 hours. Lipid Profile: No results for input(s): CHOL, HDL, LDLCALC, TRIG, CHOLHDL, LDLDIRECT in the last 72 hours. Thyroid Function Tests: No results for input(s): TSH, T4TOTAL, FREET4, T3FREE, THYROIDAB in the last 72 hours. Anemia Panel: No results for input(s): VITAMINB12, FOLATE, FERRITIN, TIBC, IRON,  RETICCTPCT in the last 72 hours. Urine analysis:    Component Value Date/Time   COLORURINE YELLOW 06/18/2021 0457   APPEARANCEUR HAZY (A) 06/18/2021 0457   LABSPEC 1.014 06/18/2021 0457   PHURINE 5.0 06/18/2021 0457   GLUCOSEU NEGATIVE 06/18/2021 0457   GLUCOSEU NEGATIVE 05/29/2021 1557   HGBUR NEGATIVE 06/18/2021 0457   HGBUR negative 07/18/2010 0941   BILIRUBINUR NEGATIVE 06/18/2021 0457   BILIRUBINUR n 08/10/2012 1131   KETONESUR NEGATIVE 06/18/2021 0457   PROTEINUR NEGATIVE 06/18/2021 0457   UROBILINOGEN 0.2 05/29/2021 1557   NITRITE NEGATIVE 06/18/2021 0457   LEUKOCYTESUR NEGATIVE 06/18/2021 0457   Sepsis Labs: @LABRCNTIP (procalcitonin:4,lacticidven:4)  ) Recent Results (from the past 240 hour(s))  Resp Panel by RT-PCR (Flu A&B, Covid) Nasopharyngeal Swab     Status: Abnormal   Collection Time: 10/20/21  4:40 PM   Specimen: Nasopharyngeal Swab; Nasopharyngeal(NP) swabs in vial transport medium  Result Value Ref Range Status   SARS Coronavirus 2 by RT PCR POSITIVE (A) NEGATIVE Final    Comment: (NOTE) SARS-CoV-2 target nucleic acids are DETECTED.  The SARS-CoV-2 RNA is generally detectable in upper respiratory specimens during the acute phase of infection. Positive results are indicative of the presence of the identified virus, but do not rule out bacterial infection or co-infection with other pathogens not detected by the test. Clinical correlation with patient history and other diagnostic information is necessary to determine patient infection status. The expected result is Negative.  Fact Sheet for Patients: EntrepreneurPulse.com.au  Fact Sheet for Healthcare Providers: IncredibleEmployment.be  This test is not yet approved or cleared by the [place] FDA and  has been authorized for detection and/or diagnosis of SARS-CoV-2 by FDA under an Emergency Use Authorization (EUA).  This EUA will remain in effect (meaning this  test can be used) for the duration of  the COVID-19 declaration under  Section 564(b)(1) of the A ct, 21 U.S.C. section 360bbb-3(b)(1), unless the authorization is terminated or revoked sooner.     Influenza A by PCR NEGATIVE NEGATIVE Final   Influenza B by PCR NEGATIVE NEGATIVE Final    Comment: (NOTE) The Xpert Xpress SARS-CoV-2/FLU/RSV plus assay is intended as an aid in the diagnosis of influenza from Nasopharyngeal swab specimens and should not be used as a sole basis for treatment. Nasal washings and aspirates are unacceptable for Xpert Xpress SARS-CoV-2/FLU/RSV testing.  Fact Sheet for Patients: EntrepreneurPulse.com.au  Fact Sheet for Healthcare Providers: IncredibleEmployment.be  This test is not yet approved or cleared by the [place] FDA and has been authorized for detection and/or diagnosis of SARS-CoV-2 by FDA under an Emergency Use Authorization (EUA). This EUA will remain in effect (meaning this test can be used) for the duration of the COVID-19 declaration under Section 564(b)(1) of the Act, 21 U.S.C. section 360bbb-3(b)(1), unless the authorization is terminated or revoked.  Performed at [facility], [place] [address], [place], [place] [address]   Respiratory (~20 pathogens) panel by PCR     Status: None   Collection Time: 10/20/21  8:02 PM   Specimen: Nasopharyngeal Swab; Respiratory  Result Value Ref Range Status   Adenovirus NOT DETECTED NOT DETECTED Final   Coronavirus 229E NOT DETECTED NOT DETECTED Final    Comment: (NOTE) The Coronavirus on the Respiratory Panel, DOES NOT test for the novel  Coronavirus (2019 nCoV)    Coronavirus HKU1 NOT DETECTED NOT DETECTED Final   Coronavirus NL63 NOT DETECTED NOT DETECTED Final   Coronavirus OC43 NOT DETECTED NOT DETECTED Final   Metapneumovirus NOT DETECTED NOT DETECTED Final   Rhinovirus / Enterovirus NOT DETECTED NOT DETECTED Final   Influenza A NOT  DETECTED NOT DETECTED Final   Influenza B NOT DETECTED NOT DETECTED Final   Parainfluenza Virus 1 NOT DETECTED NOT DETECTED Final   Parainfluenza Virus 2 NOT DETECTED NOT DETECTED Final   Parainfluenza Virus 3 NOT DETECTED NOT DETECTED Final   Parainfluenza Virus 4 NOT DETECTED NOT DETECTED Final   Respiratory Syncytial Virus NOT DETECTED NOT DETECTED Final   Bordetella pertussis NOT DETECTED NOT DETECTED Final   Bordetella Parapertussis NOT DETECTED NOT DETECTED Final   Chlamydophila pneumoniae NOT DETECTED NOT DETECTED Final   Mycoplasma pneumoniae NOT DETECTED NOT DETECTED Final    Comment: Performed at [facility], [place], [place], [place] [address]  MRSA Next Gen by PCR, Nasal     Status: Abnormal   Collection Time: 10/20/21 10:58 PM   Specimen: Nasal Mucosa; Nasal Swab  Result Value Ref Range Status   MRSA by PCR Next Gen DETECTED (A) NOT DETECTED Final    Comment: RESULT CALLED TO, READ BACK BY AND VERIFIED WITH: WATSON,A. 10/21/21 @0145  BY SEEL,M. (NOTE) The GeneXpert MRSA Assay (FDA approved for NASAL specimens only), is one component of a comprehensive MRSA colonization surveillance program. It is not intended to diagnose MRSA infection nor to guide or monitor treatment for MRSA infections. Test performance is not FDA approved in patients less than 73 years old. Performed at [facility], [place] [address] [place],  [address]       Studies: No results found.  Scheduled Meds:  apixaban  5 mg Oral BID   arformoterol  15 mcg Nebulization Q12H   vitamin C  500 mg Oral Daily   atorvastatin  40 mg Oral Daily   budesonide  0.25 mg Nebulization BID  chlorhexidine  15 mL Mouth Rinse BID   Chlorhexidine Gluconate Cloth  6 each Topical Q0600   dofetilide  125 mcg Oral BID   DULoxetine  30 mg Oral Daily   ferrous sulfate  325 mg Oral Q breakfast   folic acid  1 mg Oral Daily   furosemide  40 mg Intravenous BID    Ipratropium-Albuterol  1 puff Inhalation Q6H   levothyroxine  125 mcg Oral Q0600   linezolid  600 mg Oral Q12H   mouth rinse  15 mL Mouth Rinse q12n4p   methylPREDNISolone (SOLU-MEDROL) injection  94 mg Intravenous Daily   Followed by   Melene Muller ON 10/24/2021] predniSONE  50 mg Oral Daily   montelukast  10 mg Oral Daily   multivitamin with minerals  1 tablet Oral Daily   mupirocin ointment  1 application Nasal BID   pantoprazole  40 mg Oral QAC breakfast   pregabalin  50 mg Oral QHS   sodium chloride flush  3 mL Intravenous Q12H   thiamine  100 mg Oral Daily   umeclidinium bromide  1 puff Inhalation Daily   zinc sulfate  220 mg Oral Daily    Continuous Infusions:  remdesivir 100 mg in NS 100 mL Stopped (10/22/21 1121)     LOS: 2 days     Darlin Drop, MD [facility] Pager [phone]  If 7PM-7AM, please contact night-coverage [email or web address] Password [facility] 10/22/2021, 3:53 PM

## 2021-10-23 DIAGNOSIS — E875 Hyperkalemia: Secondary | ICD-10-CM

## 2021-10-23 DIAGNOSIS — I5033 Acute on chronic diastolic (congestive) heart failure: Secondary | ICD-10-CM | POA: Insufficient documentation

## 2021-10-23 LAB — CBC
HCT: 25.9 % — ABNORMAL LOW (ref 36.0–46.0)
Hemoglobin: 7.5 g/dL — ABNORMAL LOW (ref 12.0–15.0)
MCH: 28.5 pg (ref 26.0–34.0)
MCHC: 29 g/dL — ABNORMAL LOW (ref 30.0–36.0)
MCV: 98.5 fL (ref 80.0–100.0)
Platelets: 183 10*3/uL (ref 150–400)
RBC: 2.63 MIL/uL — ABNORMAL LOW (ref 3.87–5.11)
RDW: 16.6 % — ABNORMAL HIGH (ref 11.5–15.5)
WBC: 5.7 10*3/uL (ref 4.0–10.5)
nRBC: 0 % (ref 0.0–0.2)

## 2021-10-23 LAB — D-DIMER, QUANTITATIVE: D-Dimer, Quant: 1.68 ug/mL-FEU — ABNORMAL HIGH (ref 0.00–0.50)

## 2021-10-23 LAB — BASIC METABOLIC PANEL
Anion gap: 9 (ref 5–15)
BUN: 74 mg/dL — ABNORMAL HIGH (ref 8–23)
CO2: 37 mmol/L — ABNORMAL HIGH (ref 22–32)
Calcium: 8.8 mg/dL — ABNORMAL LOW (ref 8.9–10.3)
Chloride: 94 mmol/L — ABNORMAL LOW (ref 98–111)
Creatinine, Ser: 1.6 mg/dL — ABNORMAL HIGH (ref 0.44–1.00)
GFR, Estimated: 34 mL/min — ABNORMAL LOW (ref >60)
Glucose, Bld: 144 mg/dL — ABNORMAL HIGH (ref 70–99)
Potassium: 4.8 mmol/L (ref 3.5–5.1)
Sodium: 140 mmol/L (ref 135–145)

## 2021-10-23 LAB — MAGNESIUM: Magnesium: 2.4 mg/dL (ref 1.7–2.4)

## 2021-10-23 LAB — C-REACTIVE PROTEIN: CRP: 0.9 mg/dL (ref <1.0)

## 2021-10-23 LAB — PHOSPHORUS: Phosphorus: 3.8 mg/dL (ref 2.5–4.6)

## 2021-10-23 NOTE — Assessment & Plan Note (Signed)
-   On Tikosyn and apixaban

## 2021-10-23 NOTE — Assessment & Plan Note (Addendum)
-   On 12/8 she underwent irrigation and debridement of a left hip hematoma with placement of wound VAC which eventually grew out MRSA -She started on and was discharged home on 12/14 on Zyvox 50 mg twice daily for MRSA infection of the left hip and remains on this - She had a wound VAC that was removed on 10/22/2021

## 2021-10-23 NOTE — Assessment & Plan Note (Signed)
-   Potassium 5.4 followed by 5.7 and then subsequently improved

## 2021-10-23 NOTE — Assessment & Plan Note (Signed)
Body mass index is 43.41 kg/m.

## 2021-10-23 NOTE — Assessment & Plan Note (Addendum)
-   Felt to be multifactorial secondary to COVID-19 infection, COPD exacerbation and CHF exacerbation - At baseline she uses 2 L of oxygen chronically - She had hypercarbic respiratory failure requiring BiPAP initially - Treated with IV remdesivir, Solu-Medrol, IV Lasix twice daily along with bronchodilators and antitussives - Respiratory status has improved significantly and she has been weaned down to 2 L of oxygen

## 2021-10-23 NOTE — Progress Notes (Signed)
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  Work-up revealed COVID-19 viral pneumonia and acute on chronic hypoxic hypercarbic respiratory failure requiring BiPAP on admission and at night while inpatient.  Started on antiviral, IV remdesivir, IV Solu-Medrol, bronchodilators, pulmonary toilet with improvement of her symptomatology.    Subjective: She states she feels well today but is weak and has not willing to get out of bed.  Appetite remains poor.  Assessment and Plan: * Acute on chronic respiratory failure with hypoxia and hypercapnia (HCC) - Felt to be multifactorial secondary to COVID-19 infection, COPD exacerbation and CHF exacerbation - At baseline she uses 2 L of oxygen chronically - She had hypercarbic respiratory failure requiring BiPAP initially - Being treated with IV remdesivir, Solu-Medrol, IV Lasix twice daily along with bronchodilators and antitussives - Respiratory status has improved significantly and she has been weaned down to 3 L of oxygen - DC Lasix today  COPD with acute exacerbation ([place]) -See above  Acute on chronic diastolic CHF (congestive heart failure) ([place]) - See above  Acute renal failure superimposed on stage 3b  chronic kidney disease (HCC) - Baseline creatinine 1.5 - Creatinine noted to be elevated at 2.08 has slowly improved to 1.60  Atrial fibrillation ([place]) - On Tikosyn and apixaban  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     Body mass index is 43.41 kg/m.        Time spent in minutes: 35 DVT prophylaxis: apixaban (ELIQUIS) tablet 5 mg  Code Status: Partial code- allowing BiPAP and Intubation but no CPR or medication Level of Care: Level of care: Stepdown Disposition Plan:  Status is: Inpatient  Remains inpatient appropriate because: IV treatments for acute respiratory failure    Objective:   Vitals:   10/23/21 0807 10/23/21 1000 10/23/21 1100 10/23/21 1200  BP:  (!) 152/61 (!) 148/60 (!) 163/63  Pulse:  89 94 94  Resp:  (!) 21 20 (!) 24  Temp:   98.1 F (36.7 C)   TempSrc:   Oral   SpO2: 99% 99% 100% 100%  Weight:      Height:       Filed Weights   10/21/21 0700 10/21/21 2040 10/22/21 0500  Weight: 91.7 kg 91.6 kg 97.5 kg    Exam: General exam: Appears comfortable  HEENT: PERRLA, oral mucosa moist, no sclera icterus or thrush  Respiratory system: Clear to auscultation. Respiratory effort normal. Cardiovascular system: S1 & S2 heard, regular rate and rhythm Gastrointestinal system: Abdomen soft, non-tender, nondistended. Normal bowel sounds   Central nervous system: Alert and oriented. No focal neurological deficits. Extremities: No cyanosis, clubbing or edema Skin: No  rashes or ulcers Psychiatry:  Mood & affect appropriate.     Imaging and lab data Reviewed    CBC: Recent Labs  Lab 10/20/21 1645 10/21/21 0309 10/22/21 0712 10/23/21 0313  WBC 9.2 3.7* 5.7 5.7  NEUTROABS 6.4  --   --   --   HGB 8.2* 7.5* 7.7* 7.5*  HCT 29.2* 25.3* 26.2* 25.9*  MCV 102.5* 97.7 96.0 98.5  PLT 203 174 183 183   Basic Metabolic Panel: Recent Labs  Lab 10/20/21 1645 10/20/21 2304 10/21/21 0309 10/21/21 1706 10/22/21 0712 10/23/21 0313  NA 137  --  138 139 140 140  K 5.4*  --  5.7* 4.8 4.9 4.8  CL 96*  --  96* 94* 94* 94*  CO2 34*  --  32 33* 38* 37*  GLUCOSE 138*  --  131* 153* 142* 144*  BUN 54*  --  55* 59* 62* 74*  CREATININE 2.08*  --  2.01* 1.82* 1.60* 1.60*  CALCIUM 9.0  --  9.5 9.4 9.4 8.8*  MG  --  5.9*  --   --  2.5* 2.4  PHOS  --   --   --   --  4.0 3.8   GFR: Estimated Creatinine Clearance: 31.6 mL/min (A) (by C-G formula based on SCr of 1.6 mg/dL (H)).  Time spent: 35 minutes.   Author: Calvert Cantor  10/23/2021 4:24 PM  To reach On-call, see care teams to locate the attending and reach out via [email or web address]. Between 7PM-7AM, please contact night-coverage If you still have difficulty reaching the attending provider, please page the [facility] (Director on Call) for [facility] on amion for assistance.

## 2021-10-23 NOTE — Assessment & Plan Note (Addendum)
-   Baseline creatinine 1.5 - Creatinine noted to be elevated at 2.08 has slowly improved to 1.34

## 2021-10-23 NOTE — Progress Notes (Signed)
This nurse called nurse [place], RN on 4W to give report on this patient for transfer of care. Report was given with no questions/concerns from the receiving nurse; all items from patient room were collected and verified with the patient, all medication (respiratory) and patient chart information was transferred up to 4W-Rm 1436 without complications. Pt was transferred on 2L [place] without complications and [place], RN took over plan of care. Pt did not have any questions/concerns at this time.

## 2021-10-23 NOTE — Hospital Course (Signed)
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  Work-up revealed COVID-19 viral pneumonia and acute on chronic hypoxic hypercarbic respiratory failure requiring BiPAP on admission and at night while inpatient.  Started on antiviral, IV remdesivir, IV Solu-Medrol, bronchodilators, pulmonary toilet with improvement of her symptomatology.

## 2021-10-24 DIAGNOSIS — D649 Anemia, unspecified: Secondary | ICD-10-CM | POA: Diagnosis present

## 2021-10-24 LAB — CBC
HCT: 26.3 % — ABNORMAL LOW (ref 36.0–46.0)
Hemoglobin: 7.8 g/dL — ABNORMAL LOW (ref 12.0–15.0)
MCH: 28.9 pg (ref 26.0–34.0)
MCHC: 29.7 g/dL — ABNORMAL LOW (ref 30.0–36.0)
MCV: 97.4 fL (ref 80.0–100.0)
Platelets: 173 10*3/uL (ref 150–400)
RBC: 2.7 MIL/uL — ABNORMAL LOW (ref 3.87–5.11)
RDW: 16.6 % — ABNORMAL HIGH (ref 11.5–15.5)
WBC: 6.2 10*3/uL (ref 4.0–10.5)
nRBC: 0 % (ref 0.0–0.2)

## 2021-10-24 LAB — BASIC METABOLIC PANEL
Anion gap: 7 (ref 5–15)
BUN: 64 mg/dL — ABNORMAL HIGH (ref 8–23)
CO2: 37 mmol/L — ABNORMAL HIGH (ref 22–32)
Calcium: 8.6 mg/dL — ABNORMAL LOW (ref 8.9–10.3)
Chloride: 91 mmol/L — ABNORMAL LOW (ref 98–111)
Creatinine, Ser: 1.34 mg/dL — ABNORMAL HIGH (ref 0.44–1.00)
GFR, Estimated: 42 mL/min — ABNORMAL LOW (ref >60)
Glucose, Bld: 130 mg/dL — ABNORMAL HIGH (ref 70–99)
Potassium: 4.4 mmol/L (ref 3.5–5.1)
Sodium: 135 mmol/L (ref 135–145)

## 2021-10-24 LAB — GLUCOSE, CAPILLARY
Glucose-Capillary: 88 mg/dL (ref 70–99)
Glucose-Capillary: 135 mg/dL — ABNORMAL HIGH (ref 70–99)
Glucose-Capillary: 113 mg/dL — ABNORMAL HIGH (ref 70–99)

## 2021-10-24 LAB — C-REACTIVE PROTEIN: CRP: 0.6 mg/dL (ref <1.0)

## 2021-10-24 LAB — D-DIMER, QUANTITATIVE: D-Dimer, Quant: 1.64 ug/mL-FEU — ABNORMAL HIGH (ref 0.00–0.50)

## 2021-10-24 MED ORDER — DOFETILIDE 250 MCG PO CAPS
250.0000 ug | ORAL_CAPSULE | Freq: Two times a day (BID) | ORAL | Status: DC
  Administered 2021-10-24 – 2021-10-25 (×2): 250 ug via ORAL
  Filled 2021-10-24 (×2): qty 1

## 2021-10-24 MED ORDER — LOPERAMIDE HCL 2 MG PO CAPS
2.0000 mg | ORAL_CAPSULE | Freq: Once | ORAL | Status: AC
  Administered 2021-10-24: 10:00:00 2 mg via ORAL
  Filled 2021-10-24: qty 1

## 2021-10-24 NOTE — Progress Notes (Signed)
Called and informed Daughter Cala Bradford of patient's DC orders and delay in transport. Confirmed that daughter will be home and o2 concentrator is in place. Reviewed DC instructions and follow up care. Reviewed changes to medications and explained that instructions on when to next take meds are written as part of the AVS. All questions answered. IV and tele box to be removed prior DC.

## 2021-10-24 NOTE — Plan of Care (Signed)

## 2021-10-24 NOTE — Progress Notes (Signed)
16 staples removed from incision, pt tolerated well. Small amount of drainage noted. Area cleansed and left open to air per dr's note.

## 2021-10-24 NOTE — Discharge Summary (Addendum)
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)   Morbid (severe) obesity due to excess calories (HCC)   Hyperkalemia   S/P CABG x 3   Hypothyroidism   Barrett's esophagus   Essential hypertension   PSORIATIC ARTHRITIS   Hyperlipidemia   CAD in native artery   GERD (gastroesophageal reflux disease)   Normochromic anemia     Hospital Course   This is 75 year old femalewith medical history significant of atrial fibrillation, Barrett's esophagus, CAD status post CABG x3, CHF, COPD on chronic oxygen, hypertension, OSA on CPAP, GERD, hyperlipidemia, hypothyroidism, intracranial hemorrhage, interstitial lung disease, obesity, CKD 3B, depression, psoriatic arthritis, recently admitted from 12/4 until 12/14 for periprosthetic infection in the setting of left hip replacement (09/2021), on Linezolid for MRSA+deep wound cx, wound vac removed by orthopedic surgeon on 10/22/2021 who was admitted for worsening shortness of breath and chest pain.  Work-up revealed COVID-19 viral pneumonia and acute on chronic hypoxic hypercarbic respiratory failure requiring BiPAP on admission and at night while inpatient.  Started on antiviral, IV remdesivir, IV Solu-Medrol, bronchodilators, pulmonary toilet with improvement of her symptomatology.    * Acute on chronic respiratory failure with hypoxia and hypercapnia (HCC)- (present on admission) - Felt to be multifactorial secondary to COVID-19 infection, COPD  exacerbation and CHF exacerbation - At baseline she uses 2 L of oxygen chronically - She had hypercarbic respiratory failure requiring BiPAP initially - Treated with IV remdesivir, Solu-Medrol, IV Lasix twice daily along with bronchodilators and antitussives - Respiratory status has improved significantly and she has been weaned down to 2 L of oxygen   Acute renal failure superimposed on stage 3b chronic kidney disease (HCC) - Baseline creatinine 1.5 - Creatinine noted to be elevated at 2.08 has slowly improved to 1.34  Hyperkalemia - Potassium 5.4 followed by 5.7 and then subsequently improved  Atrial fibrillation ([place])- (present on admission) - On Tikosyn and apixaban  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   PSORIATIC ARTHRITIS- (present on admission) - on Arava and Golimumab  Normochromic anemia- (present on admission) Her Hgb ranges from 7-9        Consultants: ortho Procedures performed: removal of wound vac and sutures  Disposition: Home Diet recommendation: Cardiac diet  DISCHARGE MEDICATION: Allergies as of 10/24/2021       Reactions   Gabapentin Other (See Comments)   Dizziness, lighthead        Medication List     STOP taking these medications    HYDROcodone-acetaminophen 5-325 MG  tablet Commonly known as: NORCO/VICODIN       TAKE these medications    acetaminophen 650 MG CR tablet Commonly known as: TYLENOL Take 1,300 mg by mouth 2 (two) times daily.    albuterol (2.5 MG/3ML) 0.083% nebulizer solution Commonly known as: PROVENTIL Take 2.5 mg by nebulization every 6 (six) hours as needed for wheezing or shortness of breath.   albuterol 108 (90 Base) MCG/ACT inhaler Commonly known as: VENTOLIN HFA Inhale 2 puffs into the lungs every 4 (four) hours as needed (shortness of breath, if you can't catch your breath).   apixaban 5 MG Tabs tablet Commonly known as: Eliquis Take 1 tablet (5 mg total) by mouth 2 (two) times daily.   atorvastatin 40 MG tablet Commonly known as: LIPITOR Take 40 mg by mouth daily.   budesonide 0.25 MG/2ML nebulizer solution Commonly known as: Pulmicort One twice daily What changed:  how much to take how to take this when to take this additional instructions   diltiazem 240 MG 24 hr capsule Commonly known as: CARDIZEM CD Take 1 capsule (240 mg total) by mouth daily.   dofetilide 250 MCG capsule Commonly known as: TIKOSYN Take 1 capsule by mouth twice daily   DULoxetine 30 MG capsule Commonly known as: CYMBALTA Take 30 mg by mouth daily.   ferrous sulfate 325 (65 FE) MG tablet Take 1 tablet (325 mg total) by mouth daily with breakfast.   formoterol 20 MCG/2ML nebulizer solution Commonly known as: PERFOROMIST Take 2 mLs (20 mcg total) by nebulization 2 (two) times daily. Use in nebulizer twice daily perfectly regularly   golimumab 50 MG/4ML Soln injection Commonly known as: SIMPONI ARIA Inject 50 mg into the vein every 8 (eight) weeks.   hydrocortisone 2.5 % cream Apply 1 application topically daily as needed for itching (psoriasis).   hydroxypropyl methylcellulose / hypromellose 2.5 % ophthalmic solution Commonly known as: ISOPTO TEARS / GONIOVISC Place 1 drop into both eyes 3 (three) times daily as needed for dry eyes.   Incruse Ellipta 62.5 MCG/ACT Aepb Generic drug: umeclidinium bromide Inhale 1 puff into the lungs daily.   ketoconazole 2 % cream Commonly known as: NIZORAL Apply 1  application topically daily as needed for irritation (psoriasis).   leflunomide 20 MG tablet Commonly known as: Arava Take 1 tablet (20 mg total) by mouth daily.   levothyroxine 125 MCG tablet Commonly known as: SYNTHROID TAKE 1 TABLET BY MOUTH EVERY DAY What changed: when to take this   linezolid 600 MG tablet Commonly known as: ZYVOX Take 1 tablet (600 mg total) by mouth every 12 (twelve) hours.   losartan 50 MG tablet Commonly known as: COZAAR Take 1 tablet (50 mg total) by mouth daily.   Magnesium Oxide 400 MG Caps Take 1 capsule (400 mg total) by mouth daily.   metoprolol tartrate 25 MG tablet Commonly known as: LOPRESSOR Take 0.5 tablets (12.5 mg total) by mouth 2 (two) times daily.   montelukast 10 MG tablet Commonly known as: SINGULAIR Take 10 mg by mouth daily.   nitroGLYCERIN 0.4 MG SL tablet Commonly known as: NITROSTAT Place 1 tablet (0.4 mg total) under the tongue every 5 (five) minutes as needed for chest pain.   nystatin powder Commonly known as: MYCOSTATIN/NYSTOP APPLY 1 APPLICATION        TOPICALLY 3 TIMES A DAY What changed: See the new instructions.   ondansetron 4 MG disintegrating tablet Commonly known as: ZOFRAN-ODT Take 4 mg by mouth daily as needed (with each  dose of hydrocodone).   OXYGEN Inhale 3 L into the lungs continuous.   pantoprazole 40 MG tablet Commonly known as: PROTONIX Take 1 tablet (40 mg total) by mouth daily before breakfast.   pregabalin 50 MG capsule Commonly known as: LYRICA Take 50 mg by mouth at bedtime.   torsemide 20 MG tablet Commonly known as: DEMADEX Take 1 tablet (20 mg total) by mouth 2 (two) times daily.   traMADol 50 MG tablet Commonly known as: ULTRAM Take 50 mg by mouth daily as needed (pain).   triamcinolone cream 0.1 % Commonly known as: KENALOG Apply 1 application topically 2 (two) times daily as needed (for psoriasis).   TUMS ULTRA 1000 PO Take 1,000 mg by mouth every morning.   vitamin  B-12 1000 MCG tablet Commonly known as: CYANOCOBALAMIN Take 1,000 mcg by mouth daily.   Vitamin D3 50 MCG (2000 UT) Tabs Take 2,000 Units by mouth daily.         Discharge Exam: Filed Weights   10/21/21 2040 10/22/21 0500 10/24/21 0500  Weight: 91.6 kg 97.5 kg 90.8 kg   General exam: Appears comfortable  HEENT: PERRLA, oral mucosa moist, no sclera icterus or thrush Respiratory system: Clear to auscultation. Respiratory effort normal. Cardiovascular system: S1 & S2 heard, regular rate and rhythm Gastrointestinal system: Abdomen soft, non-tender, nondistended. Normal bowel sounds   Central nervous system: Alert and oriented. No focal neurological deficits. Extremities: No cyanosis, clubbing or edema Skin: No rashes or ulcers Psychiatry:  Mood & affect appropriate.    Condition at discharge: stable  The results of significant diagnostics from this hospitalization (including imaging, microbiology, ancillary and laboratory) are listed below for reference.   Imaging Studies: DG Chest 1 View  Result Date: 10/07/2021 CLINICAL DATA:  Hypoxia EXAM: CHEST  1 VIEW COMPARISON:  06/19/2021, CT 12/27/2020 FINDINGS: Post sternotomy changes. Cardiomegaly with vascular congestion and diffuse interstitial opacity likely edema. Probable small right-sided effusion. IMPRESSION: Cardiomegaly with vascular congestion and interstitial edema with probable small right effusion Electronically Signed   By: Jasmine Pang M.D.   On: 10/07/2021 16:32   DG Chest Portable 1 View  Result Date: 10/20/2021 CLINICAL DATA:  Shortness of breath. EXAM: PORTABLE CHEST 1 VIEW COMPARISON:  Chest radiograph 10/07/2021 FINDINGS: Low lung volumes with central vascular congestion. Diffuse hazy interstitial thickening. Probable trace bilateral pleural effusions. No pneumothorax. Stable enlarged cardiac silhouette. Postoperative changes of median sternotomy and coronary artery bypass graft. IMPRESSION: Stable cardiomegaly with  mild pulmonary edema and probable trace bilateral pleural effusions. Electronically Signed   By: Sherron Ales M.D.   On: 10/20/2021 16:42   DG HIP UNILAT WITH PELVIS MIN 4 VIEWS LEFT  Result Date: 10/07/2021 CLINICAL DATA:  Recent left hip arthroplasty, pain EXAM: DG HIP (WITH OR WITHOUT PELVIS) 4+V LEFT COMPARISON:  09/16/2021 FINDINGS: There is previous left hip arthroplasty. There is evidence of recent fracture in the medial cortex of proximal shaft of left femur. There is faint radiolucent line in the lesser trochanter. There is deformity in the greater trochanter. As far as seen, there is no dislocation in the prosthetic left hip. IMPRESSION: Status post left hip arthroplasty. There is new fracture in the medial aspect of proximal shaft of left femur and possibly in the lesser trochanter. There are calcific densities in the region of greater trochanter which may be related to recent surgery or possibly recent fracture. Electronically Signed   By: Ernie Avena M.D.   On: 10/07/2021 09:21    Microbiology: Results  for orders placed or performed during the hospital encounter of 10/20/21  Resp Panel by RT-PCR (Flu A&B, Covid) Nasopharyngeal Swab     Status: Abnormal   Collection Time: 10/20/21  4:40 PM   Specimen: Nasopharyngeal Swab; Nasopharyngeal(NP) swabs in vial transport medium  Result Value Ref Range Status   SARS Coronavirus 2 by RT PCR POSITIVE (A) NEGATIVE Final    Comment: (NOTE) SARS-CoV-2 target nucleic acids are DETECTED.  The SARS-CoV-2 RNA is generally detectable in upper respiratory specimens during the acute phase of infection. Positive results are indicative of the presence of the identified virus, but do not rule out bacterial infection or co-infection with other pathogens not detected by the test. Clinical correlation with patient history and other diagnostic information is necessary to determine patient infection status. The expected result is Negative.  Fact  Sheet for Patients: EntrepreneurPulse.com.au  Fact Sheet for Healthcare Providers: IncredibleEmployment.be  This test is not yet approved or cleared by the [place] FDA and  has been authorized for detection and/or diagnosis of SARS-CoV-2 by FDA under an Emergency Use Authorization (EUA).  This EUA will remain in effect (meaning this test can be used) for the duration of  the COVID-19 declaration under Section 564(b)(1) of the A ct, 21 U.S.C. section 360bbb-3(b)(1), unless the authorization is terminated or revoked sooner.     Influenza A by PCR NEGATIVE NEGATIVE Final   Influenza B by PCR NEGATIVE NEGATIVE Final    Comment: (NOTE) The Xpert Xpress SARS-CoV-2/FLU/RSV plus assay is intended as an aid in the diagnosis of influenza from Nasopharyngeal swab specimens and should not be used as a sole basis for treatment. Nasal washings and aspirates are unacceptable for Xpert Xpress SARS-CoV-2/FLU/RSV testing.  Fact Sheet for Patients: EntrepreneurPulse.com.au  Fact Sheet for Healthcare Providers: IncredibleEmployment.be  This test is not yet approved or cleared by the [place] FDA and has been authorized for detection and/or diagnosis of SARS-CoV-2 by FDA under an Emergency Use Authorization (EUA). This EUA will remain in effect (meaning this test can be used) for the duration of the COVID-19 declaration under Section 564(b)(1) of the Act, 21 U.S.C. section 360bbb-3(b)(1), unless the authorization is terminated or revoked.  Performed at [facility], [place] [address] [place], [place] [address]   Respiratory (~20 pathogens) panel by PCR     Status: None   Collection Time: 10/20/21  8:02 PM   Specimen: Nasopharyngeal Swab; Respiratory  Result Value Ref Range Status   Adenovirus NOT DETECTED NOT DETECTED Final   Coronavirus 229E NOT DETECTED NOT DETECTED Final    Comment:  (NOTE) The Coronavirus on the Respiratory Panel, DOES NOT test for the novel  Coronavirus (2019 nCoV)    Coronavirus HKU1 NOT DETECTED NOT DETECTED Final   Coronavirus NL63 NOT DETECTED NOT DETECTED Final   Coronavirus OC43 NOT DETECTED NOT DETECTED Final   Metapneumovirus NOT DETECTED NOT DETECTED Final   Rhinovirus / Enterovirus NOT DETECTED NOT DETECTED Final   Influenza A NOT DETECTED NOT DETECTED Final   Influenza B NOT DETECTED NOT DETECTED Final   Parainfluenza Virus 1 NOT DETECTED NOT DETECTED Final   Parainfluenza Virus 2 NOT DETECTED NOT DETECTED Final   Parainfluenza Virus 3 NOT DETECTED NOT DETECTED Final   Parainfluenza Virus 4 NOT DETECTED NOT DETECTED Final   Respiratory Syncytial Virus NOT DETECTED NOT DETECTED Final   Bordetella pertussis NOT DETECTED NOT DETECTED Final   Bordetella Parapertussis NOT DETECTED NOT DETECTED Final   Chlamydophila pneumoniae NOT  DETECTED NOT DETECTED Final   Mycoplasma pneumoniae NOT DETECTED NOT DETECTED Final    Comment: Performed at [facility], [place] [address] [place], [place] [address]  MRSA Next Gen by PCR, Nasal     Status: Abnormal   Collection Time: 10/20/21 10:58 PM   Specimen: Nasal Mucosa; Nasal Swab  Result Value Ref Range Status   MRSA by PCR Next Gen DETECTED (A) NOT DETECTED Final    Comment: RESULT CALLED TO, READ BACK BY AND VERIFIED WITH: WATSON,A. 10/21/21 @0145  BY SEEL,M. (NOTE) The GeneXpert MRSA Assay (FDA approved for NASAL specimens only), is one component of a comprehensive MRSA colonization surveillance program. It is not intended to diagnose MRSA infection nor to guide or monitor treatment for MRSA infections. Test performance is not FDA approved in patients less than 64 years old. Performed at [facility] [place], [place] [address] [place], [place] [address]     Labs: CBC: Recent Labs  Lab 10/20/21 1645 10/21/21 0309 10/22/21 0712 10/23/21 0313 10/24/21 0403  WBC 9.2 3.7* 5.7  5.7 6.2  NEUTROABS 6.4  --   --   --   --   HGB 8.2* 7.5* 7.7* 7.5* 7.8*  HCT 29.2* 25.3* 26.2* 25.9* 26.3*  MCV 102.5* 97.7 96.0 98.5 97.4  PLT 203 174 183 183 [id number]   Basic Metabolic Panel: Recent Labs  Lab 10/20/21 2304 10/21/21 0309 10/21/21 1706 10/22/21 0712 10/23/21 0313 10/24/21 0403  NA  --  138 139 140 140 135  K  --  5.7* 4.8 4.9 4.8 4.4  CL  --  96* 94* 94* 94* 91*  CO2  --  32 33* 38* 37* 37*  GLUCOSE  --  131* 153* 142* 144* 130*  BUN  --  55* 59* 62* 74* 64*  CREATININE  --  2.01* 1.82* 1.60* 1.60* 1.34*  CALCIUM  --  9.5 9.4 9.4 8.8* 8.6*  MG 5.9*  --   --  2.5* 2.4  --   PHOS  --   --   --  4.0 3.8  --    Liver Function Tests: Recent Labs  Lab 10/20/21 1645 10/21/21 0309  AST 18 19  ALT 13 14  ALKPHOS 81 70  BILITOT 0.5 0.7  PROT 7.1 6.6  ALBUMIN 3.2* 3.0*   CBG: Recent Labs  Lab 10/24/21 0743  GLUCAP 88    Discharge time spent: greater than 30 minutes.  Signed:  Debbe Odea MD.  [facility] 10/24/2021

## 2021-10-24 NOTE — Progress Notes (Signed)
PT Cancellation Note  Patient Details Name: [name] MRN: [id number] DOB: Jan 31, 1946   Cancelled Treatment:    Reason Eval/Treat Not Completed: PT screened, no needs identified, will sign off Pt with recent admission and TDWB at that time.  Pt also was not to receive HHPT upon that admission.  Per RN, pt to d/c home today, plans to have [facility] transport.  RN aware to re-consult if PT evaluation is indeed needed, however PT to sign off at this time.    Janan Halter [place] 10/24/2021, 11:56 AM Paulino Door, DPT Acute Rehabilitation Services Pager: [phone] Office: [phone]

## 2021-10-24 NOTE — Assessment & Plan Note (Signed)
Her Hgb ranges from 7-9

## 2021-10-24 NOTE — TOC Progression Note (Signed)
Transition of Care [facility]) - Progression Note    Patient Details  Name: [name] MRN: [id number] Date of Birth: Dec 04, 1945  [facility] of [facility]) CM/SW Contact  Geni Bers, RN Phone Number: 10/24/2021, 2:48 PM  Clinical Narrative:    [name] was called, there is a long list before pt.         Expected Discharge Plan and Services           Expected Discharge Date: 10/24/21                                     Social Determinants of Health (SDOH) Interventions    Readmission Risk Interventions Readmission Risk Prevention Plan 10/23/2021 06/21/2021  Transportation Screening Complete Complete  PCP or Specialist Appt within 5-7 Days - Complete  Home Care Screening - Complete  Medication Review (RN CM) - Complete  Medication Review (RN Care Manager) Complete -  PCP or Specialist appointment within 3-5 days of discharge Complete -  HRI or Home Care Consult Complete -  SW Recovery Care/Counseling Consult Complete -  Palliative Care Screening Not Applicable -  Skilled Nursing Facility Not Applicable -  Some recent data might be hidden

## 2021-10-24 NOTE — Assessment & Plan Note (Signed)
-   on [place] and Golimumab

## 2021-10-24 NOTE — Care Management Important Message (Signed)
Important Message  Patient Details IM Letter given to the Patient. Name: [name] MRN: [id number] Date of Birth: 1946/10/04   Medicare Important Message Given:  Yes     Caren Macadam 10/24/2021, 10:12 AM

## 2021-10-24 NOTE — Progress Notes (Signed)
Subjective: Patient is doing well. No pain complaints regarding hip   Objective: Vital signs in last 24 hours: Temp:  [97.7 F (36.5 C)-98.6 F (37 C)] 98.6 F (37 C) (12/22 0443) Pulse Rate:  [80-108] 87 (12/22 0443) Resp:  [14-36] 16 (12/22 0443) BP: (83-169)/(44-133) 144/78 (12/22 0443) SpO2:  [89 %-100 %] 97 % (12/22 0545) Weight:  [90.8 kg] 90.8 kg (12/22 0500)  Intake/Output from previous day: 12/21 0701 - 12/22 0700 In: 695.6 [P.O.:600; IV Piggyback:95.6] Out: 2200 [Urine:2200] Intake/Output this shift: No intake/output data recorded.  Recent Labs    10/22/21 0712 10/23/21 0313 10/24/21 0403  HGB 7.7* 7.5* 7.8*   Recent Labs    10/23/21 0313 10/24/21 0403  WBC 5.7 6.2  RBC 2.63* 2.70*  HCT 25.9* 26.3*  PLT 183 173   Recent Labs    10/23/21 0313 10/24/21 0403  NA 140 135  K 4.8 4.4  CL 94* 91*  CO2 37* 37*  BUN 74* 64*  CREATININE 1.60* 1.34*  GLUCOSE 144* 130*  CALCIUM 8.8* 8.6*   No results for input(s): LABPT, INR in the last 72 hours.  Dorsiflexion/Plantar flexion intact No cellulitis present Incision well healed with no drainage, erythema or exudate    Assessment/Plan: Left THA- Incision looks great. No drainage or signs of infection. Will remove staples tomorrow. Does not need to be bandaged anymore  Ollen Gross 10/24/2021, 7:25 AM

## 2021-10-25 LAB — GLUCOSE, CAPILLARY: Glucose-Capillary: 134 mg/dL — ABNORMAL HIGH (ref 70–99)

## 2021-10-25 NOTE — Progress Notes (Signed)
[facility] contacted truck dispatched for pick up.

## 2021-10-25 NOTE — Progress Notes (Signed)
Pt dc'd. Taken via [place]. Patients belongings collected. IV removed. DC paperwork provided, morning medications given

## 2021-10-25 NOTE — Progress Notes (Signed)
[facility] contacted patient 2nd on list for transport home.

## 2021-10-25 NOTE — Progress Notes (Signed)
[facility] contacted 7 patients scheduled for pickup, 2 are ahead of patient.

## 2021-10-31 ENCOUNTER — Ambulatory Visit (INDEPENDENT_AMBULATORY_CARE_PROVIDER_SITE_OTHER): Payer: Medicare Other | Admitting: Internal Medicine

## 2021-10-31 ENCOUNTER — Encounter: Payer: Self-pay | Admitting: Internal Medicine

## 2021-10-31 VITALS — BP 132/74 | HR 56 | Temp 98.3°F | Ht 59.0 in

## 2021-10-31 DIAGNOSIS — I2510 Atherosclerotic heart disease of native coronary artery without angina pectoris: Secondary | ICD-10-CM

## 2021-10-31 DIAGNOSIS — I10 Essential (primary) hypertension: Secondary | ICD-10-CM

## 2021-10-31 DIAGNOSIS — Z09 Encounter for follow-up examination after completed treatment for conditions other than malignant neoplasm: Secondary | ICD-10-CM

## 2021-10-31 MED ORDER — METOPROLOL TARTRATE 25 MG PO TABS: 12.5000 mg | ORAL_TABLET | Freq: Two times a day (BID) | ORAL | 2 refills | Status: DC

## 2021-10-31 NOTE — Progress Notes (Signed)
Established Patient Office Visit     This visit occurred during the SARS-CoV-2 public health emergency.  Safety protocols were in place, including screening questions prior to the visit, additional usage of staff PPE, and extensive cleaning of exam room while observing appropriate contact time as indicated for disinfecting solutions.    CC/Reason for Visit: Hospital discharge follow-up  HPI: Merline Perkin is a 75 y.o. female who is coming in today for the above mentioned reasons.  She is here today with her daughter Cala Bradford for hospital follow-up.  She had a hip repair in November.  Since then she has had 2 hospitalizations.  The first 1 was from December 4 through December 14 due to an infection at the site of her previous hip replacement, this required intraoperative cleanout and placement of a wound VAC.  She was readmitted to the hospital on December 18 due to respiratory failure both hypoxic and hypercarbic.  She was found to have COVID 19 pneumonia, congestive heart failure exacerbation and COPD exacerbation were thought to play a role as well.  She was discharged on December 22.  She has been doing well since discharge.  She has been using her BiPAP at night.  She was placed on linezolid for 30 days and is still taking this.  They need refills of metoprolol that was prescribed in the hospital.  Patient is concerned about her daughter's wellbeing due to the weight of her care.  They are considering looking into private duty sitting.  Past Medical/Surgical History: Past Medical History:  Diagnosis Date   Anemia    Arthritis    OA RIGHT KNEE WITH PAIN   Barrett esophagus    BiPAP (biphasic positive airway pressure)    Wears at night   Bradycardia 06/01/2015   CAD in native artery    a. NSTEMI 05/2015 s/p emergent CABG.   Chronic diastolic (congestive) heart failure (HCC)    Chronic kidney disease, stage 3a (HCC)    Chronic respiratory failure (HCC)    Chronic respiratory  failure with hypoxia and hypercapnia (HCC) 02/04/2010   Followed in Pulmonary clinic/ [facility]       - 02 dependent  since 07/02/10 >>  83% RA December 05, 2010       - ONO RA 08/05/12  :  Positive sat < 89 x 2:69m> repeat on 2lpm rec 08/12/2012  - 06/17/2013 reported desat with activity p Knee surgery > rec restart 2lpm with activity  - 06/27/2013   Walked 2lpm  x one lap @ 185 stopped due to sat 88% not sob , desat to 82% on RA just at th   COPD III spirometry if use FEV1/VC p saba  07/18/2010   Quit smoking May 2006       - PFT's  04/12/10 FEV1  1.21 (69%) ratio 77 and no change p B2,  DLC0 56%   VC 70%         - PFTs  08/08/2013 FEV1 1.21 (60%) ratio 86 and no change p B2 DLCO 79%  VC 72%  On symbicort 160 2bid  - PFT's  02/08/2018  FEV1 0.70 (40 % ) ratio 56 if use FEV1/VC  p 38 % improvement from saba p symb 160 prior to study with DLCO  78 % corrects to 147  % for alv volume   - 02/08/2018   Cough variant asthma 02/26/2011   Followed in Pulmonary clinic/ [facility]  - PFT's  06/04/15  FEV1 1.20 (67 % ) ratio 83  p 6 % improvement from saba with DLCO  80 % corrects to 132 % for alv volume      - Clinical dx based on response to symbicort       FENO 09/16/2016  =   96 on symbicort 160 2bid > added singulair  Allergy profile 09/16/2016 >  Eos 0.5 /  IgE  78 neg RAST  -  Referred to rehab 04/29/2017 > completed   Depression    Dysrhythmia    Afib   Essential hypertension 04/20/2007   Qualifier: Diagnosis of  By: Paulina Fusi, RN, Daine Gravel    GERD (gastroesophageal reflux disease)    History of ARDS 2006   History of home oxygen therapy    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 small intracranial hemorrhage in setting of HTN.   Mild carotid artery disease ([place])    a. Duplex 1-39% bilaterally in 2016.   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/2010   Followed as Primary Care Patient/ [facility]  -s/p ARDS 2006 with bacteremic S  Pna       - CT chest 07/03/10 Nonspecific PF mostly upper lobes       - CT chest 12/03/10 acute gg changes and effusions c/w chf - PFT's  02/08/2018  FVC 0.64 (28 %)   with DLCO  78 % corrects to 147 % for alv volume      Rhematoid arthritis    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   SOB (shortness of breath) on exertion     Past Surgical History:  Procedure Laterality Date   ABDOMINOPLASTY     CARDIAC CATHETERIZATION N/A 06/01/2015   Procedure: Left Heart Cath and Coronary Angiography;  Surgeon: Lorretta Harp, MD;  Location: [facility];  Service: Cardiovascular;  Laterality: N/A;   CARDIOVERSION N/A 02/12/2021   Procedure: CARDIOVERSION;  Surgeon: Freada Bergeron, MD;  Location: [facility] ENDOSCOPY;  Service: Cardiovascular;  Laterality: N/A;   CARPAL TUNNEL RELEASE     CHOLECYSTECTOMY     CORONARY ARTERY BYPASS GRAFT N/A 06/04/2015   Procedure: CORONARY ARTERY BYPASS GRAFT times three            with left internal mammary artery and right leg saphenous vein;  Surgeon: Gaye Pollack, MD;  Location: [facility];  Service: Open Heart Surgery;  Laterality: N/A;   cosmetic breast surgery     EYE SURGERY     cataract   INCISION AND DRAINAGE HIP Left 10/10/2021   Procedure: IRRIGATION AND DEBRIDEMENT HIP WITH WOUND VAC;  Surgeon: Gaynelle Arabian, MD;  Location: [facility] ORS;  Service: Orthopedics;  Laterality: Left;   JOINT REPLACEMENT     KNEE ARTHROSCOPY Left    TEE WITHOUT CARDIOVERSION  06/04/2015   Procedure: TRANSESOPHAGEAL ECHOCARDIOGRAM (TEE);  Surgeon: Gaye Pollack, MD;  Location: [facility] OR;  Service: Open Heart Surgery;;   TEE WITHOUT CARDIOVERSION N/A 02/12/2021    Procedure: TRANSESOPHAGEAL ECHOCARDIOGRAM (TEE);  Surgeon: Freada Bergeron, MD;  Location: [facility] ENDOSCOPY;  Service: Cardiovascular;  Laterality: N/A;   TOTAL HIP ARTHROPLASTY Left 09/16/2021   Procedure: TOTAL HIP ARTHROPLASTY ANTERIOR APPROACH;  Surgeon: Ollen Gross, MD;  Location: [facility];  Service: Orthopedics;  Laterality: Left;   TOTAL KNEE ARTHROPLASTY Left    TOTAL KNEE ARTHROPLASTY Right 06/06/2013   Procedure: RIGHT TOTAL KNEE ARTHROPLASTY;  Surgeon: Loanne Drilling, MD;  Location: [facility];  Service: Orthopedics;  Laterality: Right;    Social History:  reports that she quit smoking about 16 years ago. Her smoking use included cigarettes. She has a 87.75 pack-year smoking history. She has never used smokeless tobacco. She reports that she does not drink alcohol and does not use drugs.  Allergies: Allergies  Allergen Reactions   Gabapentin Other (See Comments)    Dizziness, lighthead    Family History:  Family History  Problem Relation Age of Onset   Breast cancer Mother    Coronary artery disease Father    Rheum arthritis Father      Current Outpatient Medications:    acetaminophen (TYLENOL) 650 MG CR tablet, Take 1,300 mg by mouth 2 (two) times daily., Disp: , Rfl:    albuterol (PROVENTIL) (2.5 MG/3ML) 0.083% nebulizer solution, Take 2.5 mg by nebulization every 6 (six) hours as needed for wheezing or shortness of breath., Disp: , Rfl:    albuterol (VENTOLIN HFA) 108 (90 Base) MCG/ACT inhaler, Inhale 2 puffs into the lungs every 4 (four) hours as needed (shortness of breath, if you can't catch your breath)., Disp: 8 g, Rfl: 5   apixaban (ELIQUIS) 5 MG TABS tablet, Take 1 tablet (5 mg total) by mouth 2 (two) times daily., Disp: 56 tablet, Rfl: 0   atorvastatin (LIPITOR) 40 MG tablet, Take 40 mg by mouth daily., Disp: , Rfl:    budesonide (PULMICORT) 0.25 MG/2ML nebulizer solution, One twice daily (Patient taking differently: Take 0.25 mg by nebulization 2 (two) times  daily.), Disp: 120 mL, Rfl: 12   Calcium Carbonate Antacid (TUMS ULTRA 1000 PO), Take 1,000 mg by mouth every morning., Disp: , Rfl:    Cholecalciferol (VITAMIN D3) 2000 UNITS TABS, Take 2,000 Units by mouth daily., Disp: , Rfl:    diltiazem (CARDIZEM CD) 240 MG 24 hr capsule, Take 1 capsule (240 mg total) by mouth daily., Disp: 90 capsule, Rfl: 3   dofetilide (TIKOSYN) 250 MCG capsule, Take 1 capsule by mouth twice daily (Patient taking differently: Take 250 mcg by mouth 2 (two) times daily.), Disp: 60 capsule, Rfl: 3   DULoxetine (CYMBALTA) 30 MG capsule, Take 30 mg by mouth daily., Disp: , Rfl:    ferrous sulfate 325 (65 FE) MG tablet, Take 1 tablet (325 mg total) by mouth daily with breakfast., Disp: 30 tablet, Rfl: 0   formoterol (PERFOROMIST) 20 MCG/2ML nebulizer solution, Take 2 mLs (20 mcg total) by nebulization 2 (two) times daily. Use in nebulizer twice daily perfectly regularly, Disp: 120 mL, Rfl: 11   golimumab (SIMPONI ARIA) 50 MG/4ML SOLN injection, Inject 50 mg into the vein every 8 (eight) weeks. , Disp: , Rfl:    hydrocortisone 2.5 % cream, Apply 1 application topically daily as needed for itching (psoriasis)., Disp: , Rfl:    hydroxypropyl methylcellulose / hypromellose (ISOPTO TEARS / GONIOVISC) 2.5 % ophthalmic solution, Place 1 drop into both eyes 3 (three) times daily as needed for dry eyes., Disp: , Rfl:    ketoconazole (NIZORAL) 2 % cream, Apply 1 application topically daily as needed for irritation (psoriasis)., Disp: , Rfl:  leflunomide (ARAVA) 20 MG tablet, Take 1 tablet (20 mg total) by mouth daily., Disp: , Rfl:    levothyroxine (SYNTHROID) 125 MCG tablet, TAKE 1 TABLET BY MOUTH EVERY DAY (Patient taking differently: Take 125 mcg by mouth daily before breakfast.), Disp: 90 tablet, Rfl: 1   linezolid (ZYVOX) 600 MG tablet, Take 1 tablet (600 mg total) by mouth every 12 (twelve) hours., Disp: 60 tablet, Rfl: 0   losartan (COZAAR) 50 MG tablet, Take 1 tablet (50 mg total)  by mouth daily., Disp: 90 tablet, Rfl: 3   Magnesium Oxide 400 MG CAPS, Take 1 capsule (400 mg total) by mouth daily., Disp: 90 capsule, Rfl: 3   metoprolol tartrate (LOPRESSOR) 25 MG tablet, Take 0.5 tablets (12.5 mg total) by mouth 2 (two) times daily., Disp: 60 tablet, Rfl: 0   montelukast (SINGULAIR) 10 MG tablet, Take 10 mg by mouth daily., Disp: , Rfl:    nitroGLYCERIN (NITROSTAT) 0.4 MG SL tablet, Place 1 tablet (0.4 mg total) under the tongue every 5 (five) minutes as needed for chest pain., Disp: 30 tablet, Rfl: 3   nystatin (MYCOSTATIN/NYSTOP) powder, APPLY 1 APPLICATION        TOPICALLY 3 TIMES A DAY (Patient taking differently: Apply 1 application topically daily as needed (psoriasis).), Disp: 15 g, Rfl: 0   ondansetron (ZOFRAN-ODT) 4 MG disintegrating tablet, Take 4 mg by mouth daily as needed (with each dose of hydrocodone)., Disp: , Rfl:    OXYGEN, Inhale 3 L into the lungs continuous., Disp: , Rfl:    pantoprazole (PROTONIX) 40 MG tablet, Take 1 tablet (40 mg total) by mouth daily before breakfast., Disp: 90 tablet, Rfl: 3   pregabalin (LYRICA) 50 MG capsule, Take 50 mg by mouth at bedtime., Disp: , Rfl:    torsemide (DEMADEX) 20 MG tablet, Take 1 tablet (20 mg total) by mouth 2 (two) times daily., Disp: , Rfl:    traMADol (ULTRAM) 50 MG tablet, Take 50 mg by mouth daily as needed (pain)., Disp: , Rfl:    triamcinolone cream (KENALOG) 0.1 %, Apply 1 application topically 2 (two) times daily as needed (for psoriasis). , Disp: , Rfl:    umeclidinium bromide (INCRUSE ELLIPTA) 62.5 MCG/INH AEPB, Inhale 1 puff into the lungs daily., Disp: 3 each, Rfl: 2   vitamin B-12 (CYANOCOBALAMIN) 1000 MCG tablet, Take 1,000 mcg by mouth daily., Disp: , Rfl:   Review of Systems:  Constitutional: Denies fever, chills, diaphoresis, appetite change.  HEENT: Denies photophobia, eye pain, redness, hearing loss, ear pain, congestion, sore throat, rhinorrhea, sneezing, mouth sores, trouble swallowing, neck  pain, neck stiffness and tinnitus.   Respiratory: Denies cough, chest tightness. Cardiovascular: Denies chest pain, palpitations and leg swelling.  Gastrointestinal: Denies nausea, vomiting, abdominal pain, diarrhea, constipation, blood in stool and abdominal distention.  Genitourinary: Denies dysuria, urgency, frequency, hematuria, flank pain and difficulty urinating.  Endocrine: Denies: hot or cold intolerance, sweats, changes in hair or nails, polyuria, polydipsia.  Skin: Denies pallor, rash and wound.  Neurological: Denies dizziness, seizures, syncope, weakness, light-headedness, numbness and headaches.  Hematological: Denies adenopathy. Easy bruising, personal or family bleeding history  Psychiatric/Behavioral: Denies suicidal ideation, mood changes, confusion, nervousness, sleep disturbance and agitation    Physical Exam: Vitals:   10/31/21 1138  BP: 132/74  Pulse: (!) 56  Temp: 98.3 F (36.8 C)  TempSrc: Oral  SpO2: 96%  Height: 4\' 11"  (1.499 m)    Body mass index is 40.43 kg/m.   Constitutional: NAD, calm, comfortable, in wheelchair  Eyes: PERRL, lids and conjunctivae normal ENMT: Mucous membranes are moist.  Respiratory: clear to auscultation bilaterally, mild expiratory wheezing, at baseline, normal respiratory effort. No accessory muscle use.  Cardiovascular: Regular rate and rhythm, no murmurs / rubs / gallops. No extremity edema.  Psychiatric: Normal judgment and insight. Alert and oriented x 3. Normal mood.    Impression and Plan:  Hospital discharge follow-up -Hospital charts from 3 recent hospitalizations have been reviewed in detail.  Refills of metoprolol will be sent.  I have confirmed that they are taking linezolid as planned.  They will start looking into private duty setting to alleviate some of the caregiver burden on her daughter.  She is feeling close to baseline.  They are still meeting with her orthopedist in regards to her weightbearing status and  progression with physical therapy.  Time spent: 38 minutes reviewing chart, interviewing and examining patient, formulating plan of care, discussing care with daughter.    Lelon Frohlich, MD [facility]

## 2021-11-01 DIAGNOSIS — M069 Rheumatoid arthritis, unspecified: Secondary | ICD-10-CM | POA: Diagnosis not present

## 2021-11-01 DIAGNOSIS — N1832 Chronic kidney disease, stage 3b: Secondary | ICD-10-CM | POA: Diagnosis not present

## 2021-11-01 DIAGNOSIS — M5416 Radiculopathy, lumbar region: Secondary | ICD-10-CM | POA: Diagnosis not present

## 2021-11-01 DIAGNOSIS — I509 Heart failure, unspecified: Secondary | ICD-10-CM | POA: Diagnosis not present

## 2021-11-01 DIAGNOSIS — Z471 Aftercare following joint replacement surgery: Secondary | ICD-10-CM | POA: Diagnosis not present

## 2021-11-01 DIAGNOSIS — I130 Hypertensive heart and chronic kidney disease with heart failure and stage 1 through stage 4 chronic kidney disease, or unspecified chronic kidney disease: Secondary | ICD-10-CM | POA: Diagnosis not present

## 2021-11-03 DIAGNOSIS — I4891 Unspecified atrial fibrillation: Secondary | ICD-10-CM | POA: Diagnosis not present

## 2021-11-03 DIAGNOSIS — E039 Hypothyroidism, unspecified: Secondary | ICD-10-CM | POA: Diagnosis not present

## 2021-11-03 DIAGNOSIS — J441 Chronic obstructive pulmonary disease with (acute) exacerbation: Secondary | ICD-10-CM | POA: Diagnosis not present

## 2021-11-03 DIAGNOSIS — N1832 Chronic kidney disease, stage 3b: Secondary | ICD-10-CM | POA: Diagnosis not present

## 2021-11-03 DIAGNOSIS — J1282 Pneumonia due to coronavirus disease 2019: Secondary | ICD-10-CM | POA: Diagnosis not present

## 2021-11-03 DIAGNOSIS — G4733 Obstructive sleep apnea (adult) (pediatric): Secondary | ICD-10-CM | POA: Diagnosis not present

## 2021-11-03 DIAGNOSIS — B9562 Methicillin resistant Staphylococcus aureus infection as the cause of diseases classified elsewhere: Secondary | ICD-10-CM | POA: Diagnosis not present

## 2021-11-03 DIAGNOSIS — J440 Chronic obstructive pulmonary disease with acute lower respiratory infection: Secondary | ICD-10-CM | POA: Diagnosis not present

## 2021-11-03 DIAGNOSIS — I2510 Atherosclerotic heart disease of native coronary artery without angina pectoris: Secondary | ICD-10-CM | POA: Diagnosis not present

## 2021-11-03 DIAGNOSIS — L4050 Arthropathic psoriasis, unspecified: Secondary | ICD-10-CM | POA: Diagnosis not present

## 2021-11-03 DIAGNOSIS — F332 Major depressive disorder, recurrent severe without psychotic features: Secondary | ICD-10-CM | POA: Diagnosis not present

## 2021-11-03 DIAGNOSIS — E785 Hyperlipidemia, unspecified: Secondary | ICD-10-CM | POA: Diagnosis not present

## 2021-11-03 DIAGNOSIS — I4892 Unspecified atrial flutter: Secondary | ICD-10-CM | POA: Diagnosis not present

## 2021-11-03 DIAGNOSIS — I252 Old myocardial infarction: Secondary | ICD-10-CM | POA: Diagnosis not present

## 2021-11-03 DIAGNOSIS — J9612 Chronic respiratory failure with hypercapnia: Secondary | ICD-10-CM | POA: Diagnosis not present

## 2021-11-03 DIAGNOSIS — I130 Hypertensive heart and chronic kidney disease with heart failure and stage 1 through stage 4 chronic kidney disease, or unspecified chronic kidney disease: Secondary | ICD-10-CM | POA: Diagnosis not present

## 2021-11-03 DIAGNOSIS — M069 Rheumatoid arthritis, unspecified: Secondary | ICD-10-CM | POA: Diagnosis not present

## 2021-11-03 DIAGNOSIS — U071 COVID-19: Secondary | ICD-10-CM | POA: Diagnosis not present

## 2021-11-03 DIAGNOSIS — K219 Gastro-esophageal reflux disease without esophagitis: Secondary | ICD-10-CM | POA: Diagnosis not present

## 2021-11-03 DIAGNOSIS — J9611 Chronic respiratory failure with hypoxia: Secondary | ICD-10-CM | POA: Diagnosis not present

## 2021-11-03 DIAGNOSIS — K2270 Barrett's esophagus without dysplasia: Secondary | ICD-10-CM | POA: Diagnosis not present

## 2021-11-03 DIAGNOSIS — M5416 Radiculopathy, lumbar region: Secondary | ICD-10-CM | POA: Diagnosis not present

## 2021-11-03 DIAGNOSIS — T8452XD Infection and inflammatory reaction due to internal left hip prosthesis, subsequent encounter: Secondary | ICD-10-CM | POA: Diagnosis not present

## 2021-11-03 DIAGNOSIS — I509 Heart failure, unspecified: Secondary | ICD-10-CM | POA: Diagnosis not present

## 2021-11-04 DIAGNOSIS — M5416 Radiculopathy, lumbar region: Secondary | ICD-10-CM | POA: Diagnosis not present

## 2021-11-04 DIAGNOSIS — J440 Chronic obstructive pulmonary disease with acute lower respiratory infection: Secondary | ICD-10-CM | POA: Diagnosis not present

## 2021-11-04 DIAGNOSIS — M069 Rheumatoid arthritis, unspecified: Secondary | ICD-10-CM | POA: Diagnosis not present

## 2021-11-04 DIAGNOSIS — J1282 Pneumonia due to coronavirus disease 2019: Secondary | ICD-10-CM | POA: Diagnosis not present

## 2021-11-04 DIAGNOSIS — U071 COVID-19: Secondary | ICD-10-CM | POA: Diagnosis not present

## 2021-11-04 DIAGNOSIS — J441 Chronic obstructive pulmonary disease with (acute) exacerbation: Secondary | ICD-10-CM | POA: Diagnosis not present

## 2021-11-05 ENCOUNTER — Encounter (HOSPITAL_BASED_OUTPATIENT_CLINIC_OR_DEPARTMENT_OTHER): Payer: Self-pay | Admitting: Emergency Medicine

## 2021-11-05 ENCOUNTER — Inpatient Hospital Stay (HOSPITAL_BASED_OUTPATIENT_CLINIC_OR_DEPARTMENT_OTHER)
Admission: EM | Admit: 2021-11-05 | Discharge: 2021-11-10 | DRG: 871 | Disposition: A | Discharge status: Other Acute Inpatient Hospital | Payer: Medicare Other | Attending: Internal Medicine | Admitting: Internal Medicine

## 2021-11-05 ENCOUNTER — Telehealth: Payer: Self-pay | Admitting: Internal Medicine

## 2021-11-05 ENCOUNTER — Emergency Department (HOSPITAL_BASED_OUTPATIENT_CLINIC_OR_DEPARTMENT_OTHER): Payer: Medicare Other

## 2021-11-05 DIAGNOSIS — E039 Hypothyroidism, unspecified: Secondary | ICD-10-CM | POA: Diagnosis present

## 2021-11-05 DIAGNOSIS — M069 Rheumatoid arthritis, unspecified: Secondary | ICD-10-CM | POA: Diagnosis present

## 2021-11-05 DIAGNOSIS — R0602 Shortness of breath: Secondary | ICD-10-CM | POA: Diagnosis not present

## 2021-11-05 DIAGNOSIS — I2510 Atherosclerotic heart disease of native coronary artery without angina pectoris: Secondary | ICD-10-CM | POA: Diagnosis present

## 2021-11-05 DIAGNOSIS — U071 COVID-19: Secondary | ICD-10-CM | POA: Diagnosis present

## 2021-11-05 DIAGNOSIS — Z6841 Body Mass Index (BMI) 40.0 and over, adult: Secondary | ICD-10-CM

## 2021-11-05 DIAGNOSIS — J849 Interstitial pulmonary disease, unspecified: Secondary | ICD-10-CM | POA: Diagnosis present

## 2021-11-05 DIAGNOSIS — Y848 Other medical procedures as the cause of abnormal reaction of the patient, or of later complication, without mention of misadventure at the time of the procedure: Secondary | ICD-10-CM | POA: Diagnosis not present

## 2021-11-05 DIAGNOSIS — K219 Gastro-esophageal reflux disease without esophagitis: Secondary | ICD-10-CM | POA: Diagnosis present

## 2021-11-05 DIAGNOSIS — N179 Acute kidney failure, unspecified: Secondary | ICD-10-CM | POA: Diagnosis present

## 2021-11-05 DIAGNOSIS — J9622 Acute and chronic respiratory failure with hypercapnia: Secondary | ICD-10-CM | POA: Diagnosis not present

## 2021-11-05 DIAGNOSIS — L4050 Arthropathic psoriasis, unspecified: Secondary | ICD-10-CM | POA: Diagnosis present

## 2021-11-05 DIAGNOSIS — D649 Anemia, unspecified: Secondary | ICD-10-CM

## 2021-11-05 DIAGNOSIS — I252 Old myocardial infarction: Secondary | ICD-10-CM

## 2021-11-05 DIAGNOSIS — I509 Heart failure, unspecified: Secondary | ICD-10-CM | POA: Diagnosis not present

## 2021-11-05 DIAGNOSIS — I130 Hypertensive heart and chronic kidney disease with heart failure and stage 1 through stage 4 chronic kidney disease, or unspecified chronic kidney disease: Secondary | ICD-10-CM | POA: Diagnosis present

## 2021-11-05 DIAGNOSIS — R059 Cough, unspecified: Secondary | ICD-10-CM | POA: Diagnosis not present

## 2021-11-05 DIAGNOSIS — J9621 Acute and chronic respiratory failure with hypoxia: Secondary | ICD-10-CM | POA: Diagnosis not present

## 2021-11-05 DIAGNOSIS — J9602 Acute respiratory failure with hypercapnia: Secondary | ICD-10-CM | POA: Diagnosis not present

## 2021-11-05 DIAGNOSIS — K2270 Barrett's esophagus without dysplasia: Secondary | ICD-10-CM | POA: Diagnosis present

## 2021-11-05 DIAGNOSIS — D696 Thrombocytopenia, unspecified: Secondary | ICD-10-CM | POA: Diagnosis present

## 2021-11-05 DIAGNOSIS — J441 Chronic obstructive pulmonary disease with (acute) exacerbation: Secondary | ICD-10-CM | POA: Diagnosis not present

## 2021-11-05 DIAGNOSIS — J9601 Acute respiratory failure with hypoxia: Secondary | ICD-10-CM

## 2021-11-05 DIAGNOSIS — I482 Chronic atrial fibrillation, unspecified: Secondary | ICD-10-CM | POA: Diagnosis not present

## 2021-11-05 DIAGNOSIS — E8771 Transfusion associated circulatory overload: Secondary | ICD-10-CM | POA: Diagnosis not present

## 2021-11-05 DIAGNOSIS — J449 Chronic obstructive pulmonary disease, unspecified: Secondary | ICD-10-CM | POA: Diagnosis not present

## 2021-11-05 DIAGNOSIS — I110 Hypertensive heart disease with heart failure: Secondary | ICD-10-CM | POA: Diagnosis not present

## 2021-11-05 DIAGNOSIS — L7634 Postprocedural seroma of skin and subcutaneous tissue following other procedure: Secondary | ICD-10-CM | POA: Diagnosis present

## 2021-11-05 DIAGNOSIS — I5033 Acute on chronic diastolic (congestive) heart failure: Secondary | ICD-10-CM | POA: Diagnosis present

## 2021-11-05 DIAGNOSIS — Z8673 Personal history of transient ischemic attack (TIA), and cerebral infarction without residual deficits: Secondary | ICD-10-CM

## 2021-11-05 DIAGNOSIS — I10 Essential (primary) hypertension: Secondary | ICD-10-CM | POA: Diagnosis not present

## 2021-11-05 DIAGNOSIS — N1832 Chronic kidney disease, stage 3b: Secondary | ICD-10-CM | POA: Diagnosis not present

## 2021-11-05 DIAGNOSIS — R079 Chest pain, unspecified: Secondary | ICD-10-CM | POA: Diagnosis not present

## 2021-11-05 DIAGNOSIS — J9690 Respiratory failure, unspecified, unspecified whether with hypoxia or hypercapnia: Secondary | ICD-10-CM | POA: Diagnosis present

## 2021-11-05 DIAGNOSIS — F32A Depression, unspecified: Secondary | ICD-10-CM | POA: Diagnosis not present

## 2021-11-05 DIAGNOSIS — Z951 Presence of aortocoronary bypass graft: Secondary | ICD-10-CM | POA: Diagnosis not present

## 2021-11-05 DIAGNOSIS — Z8261 Family history of arthritis: Secondary | ICD-10-CM

## 2021-11-05 DIAGNOSIS — J810 Acute pulmonary edema: Secondary | ICD-10-CM | POA: Diagnosis not present

## 2021-11-05 DIAGNOSIS — R531 Weakness: Secondary | ICD-10-CM | POA: Diagnosis not present

## 2021-11-05 DIAGNOSIS — Z8249 Family history of ischemic heart disease and other diseases of the circulatory system: Secondary | ICD-10-CM

## 2021-11-05 DIAGNOSIS — D61818 Other pancytopenia: Secondary | ICD-10-CM | POA: Diagnosis not present

## 2021-11-05 DIAGNOSIS — Z803 Family history of malignant neoplasm of breast: Secondary | ICD-10-CM

## 2021-11-05 DIAGNOSIS — Z87891 Personal history of nicotine dependence: Secondary | ICD-10-CM

## 2021-11-05 DIAGNOSIS — Z9049 Acquired absence of other specified parts of digestive tract: Secondary | ICD-10-CM

## 2021-11-05 DIAGNOSIS — G4733 Obstructive sleep apnea (adult) (pediatric): Secondary | ICD-10-CM | POA: Diagnosis present

## 2021-11-05 DIAGNOSIS — J80 Acute respiratory distress syndrome: Secondary | ICD-10-CM | POA: Diagnosis not present

## 2021-11-05 DIAGNOSIS — J90 Pleural effusion, not elsewhere classified: Secondary | ICD-10-CM | POA: Diagnosis not present

## 2021-11-05 DIAGNOSIS — A4189 Other specified sepsis: Principal | ICD-10-CM | POA: Diagnosis present

## 2021-11-05 DIAGNOSIS — E785 Hyperlipidemia, unspecified: Secondary | ICD-10-CM | POA: Diagnosis present

## 2021-11-05 DIAGNOSIS — Z7901 Long term (current) use of anticoagulants: Secondary | ICD-10-CM

## 2021-11-05 DIAGNOSIS — R0902 Hypoxemia: Secondary | ICD-10-CM | POA: Diagnosis not present

## 2021-11-05 DIAGNOSIS — I5032 Chronic diastolic (congestive) heart failure: Secondary | ICD-10-CM | POA: Diagnosis not present

## 2021-11-05 DIAGNOSIS — Z8614 Personal history of Methicillin resistant Staphylococcus aureus infection: Secondary | ICD-10-CM

## 2021-11-05 DIAGNOSIS — E875 Hyperkalemia: Secondary | ICD-10-CM | POA: Diagnosis not present

## 2021-11-05 DIAGNOSIS — R0689 Other abnormalities of breathing: Secondary | ICD-10-CM

## 2021-11-05 DIAGNOSIS — L8990 Pressure ulcer of unspecified site, unspecified stage: Secondary | ICD-10-CM | POA: Insufficient documentation

## 2021-11-05 DIAGNOSIS — Z96653 Presence of artificial knee joint, bilateral: Secondary | ICD-10-CM | POA: Diagnosis present

## 2021-11-05 DIAGNOSIS — I4891 Unspecified atrial fibrillation: Secondary | ICD-10-CM | POA: Diagnosis present

## 2021-11-05 DIAGNOSIS — I517 Cardiomegaly: Secondary | ICD-10-CM | POA: Diagnosis not present

## 2021-11-05 DIAGNOSIS — R062 Wheezing: Secondary | ICD-10-CM | POA: Diagnosis not present

## 2021-11-05 LAB — I-STAT VENOUS BLOOD GAS, ED
Acid-Base Excess: 8 mmol/L — ABNORMAL HIGH (ref 0.0–2.0)
Acid-Base Excess: 8 mmol/L — ABNORMAL HIGH (ref 0.0–2.0)
Bicarbonate: 37.3 mmol/L — ABNORMAL HIGH (ref 20.0–28.0)
Bicarbonate: 35.9 mmol/L — ABNORMAL HIGH (ref 20.0–28.0)
Calcium, Ion: 1.1 mmol/L — ABNORMAL LOW (ref 1.15–1.40)
Calcium, Ion: 1.07 mmol/L — ABNORMAL LOW (ref 1.15–1.40)
HCT: 23 % — ABNORMAL LOW (ref 36.0–46.0)
HCT: 16 % — ABNORMAL LOW (ref 36.0–46.0)
Hemoglobin: 7.8 g/dL — ABNORMAL LOW (ref 12.0–15.0)
Hemoglobin: 5.4 g/dL — CL (ref 12.0–15.0)
O2 Saturation: 50 %
O2 Saturation: 68 %
Potassium: 3.9 mmol/L (ref 3.5–5.1)
Potassium: 3.7 mmol/L (ref 3.5–5.1)
Sodium: 138 mmol/L (ref 135–145)
Sodium: 139 mmol/L (ref 135–145)
TCO2: 40 mmol/L — ABNORMAL HIGH (ref 22–32)
TCO2: 38 mmol/L — ABNORMAL HIGH (ref 22–32)
pCO2, Ven: 95.8 mmHg (ref 44.0–60.0)
pCO2, Ven: 80.4 mmHg (ref 44.0–60.0)
pH, Ven: 7.198 — CL (ref 7.250–7.430)
pH, Ven: 7.258 (ref 7.250–7.430)
pO2, Ven: 35 mmHg (ref 32.0–45.0)
pO2, Ven: 43 mmHg (ref 32.0–45.0)

## 2021-11-05 LAB — PREPARE RBC (CROSSMATCH)

## 2021-11-05 LAB — TROPONIN I (HIGH SENSITIVITY)
Troponin I (High Sensitivity): 23 ng/L — ABNORMAL HIGH (ref <18)
Troponin I (High Sensitivity): 22 ng/L — ABNORMAL HIGH (ref <18)

## 2021-11-05 LAB — COMPREHENSIVE METABOLIC PANEL
ALT: 15 U/L (ref 0–44)
AST: 18 U/L (ref 15–41)
Albumin: 3.2 g/dL — ABNORMAL LOW (ref 3.5–5.0)
Alkaline Phosphatase: 63 U/L (ref 38–126)
Anion gap: 13 (ref 5–15)
BUN: 33 mg/dL — ABNORMAL HIGH (ref 8–23)
CO2: 31 mmol/L (ref 22–32)
Calcium: 9.2 mg/dL (ref 8.9–10.3)
Chloride: 96 mmol/L — ABNORMAL LOW (ref 98–111)
Creatinine, Ser: 1.82 mg/dL — ABNORMAL HIGH (ref 0.44–1.00)
GFR, Estimated: 29 mL/min — ABNORMAL LOW (ref >60)
Glucose, Bld: 145 mg/dL — ABNORMAL HIGH (ref 70–99)
Potassium: 4.1 mmol/L (ref 3.5–5.1)
Sodium: 140 mmol/L (ref 135–145)
Total Bilirubin: 0.6 mg/dL (ref 0.3–1.2)
Total Protein: 6.7 g/dL (ref 6.5–8.1)

## 2021-11-05 LAB — MAGNESIUM: Magnesium: 2.3 mg/dL (ref 1.7–2.4)

## 2021-11-05 LAB — GLUCOSE, CAPILLARY: Glucose-Capillary: 155 mg/dL — ABNORMAL HIGH (ref 70–99)

## 2021-11-05 LAB — RESP PANEL BY RT-PCR (FLU A&B, COVID) ARPGX2
Influenza A by PCR: NEGATIVE
Influenza B by PCR: NEGATIVE
SARS Coronavirus 2 by RT PCR: POSITIVE — AB

## 2021-11-05 LAB — BRAIN NATRIURETIC PEPTIDE: B Natriuretic Peptide: 607.4 pg/mL — ABNORMAL HIGH (ref 0.0–100.0)

## 2021-11-05 LAB — BASIC METABOLIC PANEL
Anion gap: 14 (ref 5–15)
BUN: 30 mg/dL — ABNORMAL HIGH (ref 8–23)
CO2: 31 mmol/L (ref 22–32)
Calcium: 9.1 mg/dL (ref 8.9–10.3)
Chloride: 96 mmol/L — ABNORMAL LOW (ref 98–111)
Creatinine, Ser: 2 mg/dL — ABNORMAL HIGH (ref 0.44–1.00)
GFR, Estimated: 26 mL/min — ABNORMAL LOW (ref >60)
Glucose, Bld: 171 mg/dL — ABNORMAL HIGH (ref 70–99)
Potassium: 3.8 mmol/L (ref 3.5–5.1)
Sodium: 141 mmol/L (ref 135–145)

## 2021-11-05 LAB — LACTIC ACID, PLASMA
Lactic Acid, Venous: 1.4 mmol/L (ref 0.5–1.9)
Lactic Acid, Venous: 3 mmol/L (ref 0.5–1.9)
Lactic Acid, Venous: 2.5 mmol/L (ref 0.5–1.9)

## 2021-11-05 LAB — MRSA NEXT GEN BY PCR, NASAL: MRSA by PCR Next Gen: NOT DETECTED

## 2021-11-05 LAB — OCCULT BLOOD X 1 CARD TO LAB, STOOL: Fecal Occult Bld: NEGATIVE

## 2021-11-05 MED ORDER — SODIUM CHLORIDE 0.9 % IV SOLN
500.0000 mg | Freq: Once | INTRAVENOUS | Status: AC
  Administered 2021-11-05: 500 mg via INTRAVENOUS
  Filled 2021-11-05: qty 5

## 2021-11-05 MED ORDER — LOSARTAN POTASSIUM 50 MG PO TABS: 50.0000 mg | ORAL_TABLET | Freq: Every day | ORAL | Status: DC

## 2021-11-05 MED ORDER — ALBUTEROL SULFATE (2.5 MG/3ML) 0.083% IN NEBU
10.0000 mg | INHALATION_SOLUTION | Freq: Once | RESPIRATORY_TRACT | Status: AC
  Administered 2021-11-05: 10 mg via RESPIRATORY_TRACT
  Filled 2021-11-05: qty 12

## 2021-11-05 MED ORDER — VITAMIN D 25 MCG (1000 UNIT) PO TABS
2000.0000 [IU] | ORAL_TABLET | Freq: Every day | ORAL | Status: DC
  Administered 2021-11-06 – 2021-11-10 (×5): 2000 [IU] via ORAL
  Filled 2021-11-05 (×6): qty 2

## 2021-11-05 MED ORDER — TORSEMIDE 20 MG PO TABS
20.0000 mg | ORAL_TABLET | Freq: Two times a day (BID) | ORAL | Status: DC
  Filled 2021-11-05: qty 1

## 2021-11-05 MED ORDER — SODIUM CHLORIDE 0.9 % IV SOLN: INTRAVENOUS | Status: DC | PRN

## 2021-11-05 MED ORDER — ARFORMOTEROL TARTRATE 15 MCG/2ML IN NEBU
15.0000 ug | INHALATION_SOLUTION | Freq: Two times a day (BID) | RESPIRATORY_TRACT | Status: DC
  Administered 2021-11-05 – 2021-11-10 (×7): 15 ug via RESPIRATORY_TRACT
  Filled 2021-11-05 (×9): qty 2

## 2021-11-05 MED ORDER — PANTOPRAZOLE SODIUM 40 MG PO TBEC
40.0000 mg | DELAYED_RELEASE_TABLET | Freq: Every day | ORAL | Status: DC
  Administered 2021-11-06 – 2021-11-10 (×5): 40 mg via ORAL
  Filled 2021-11-05 (×5): qty 1

## 2021-11-05 MED ORDER — METOPROLOL TARTRATE 12.5 MG HALF TABLET
12.5000 mg | ORAL_TABLET | Freq: Two times a day (BID) | ORAL | Status: DC
  Administered 2021-11-05 – 2021-11-08 (×6): 12.5 mg via ORAL
  Filled 2021-11-05 (×6): qty 1

## 2021-11-05 MED ORDER — DULOXETINE HCL 30 MG PO CPEP
30.0000 mg | ORAL_CAPSULE | Freq: Every day | ORAL | Status: DC
  Administered 2021-11-06 – 2021-11-10 (×5): 30 mg via ORAL
  Filled 2021-11-05 (×5): qty 1

## 2021-11-05 MED ORDER — POTASSIUM CHLORIDE 20 MEQ PO PACK
20.0000 meq | PACK | Freq: Once | ORAL | Status: AC
  Administered 2021-11-06: 20 meq via ORAL
  Filled 2021-11-05: qty 1

## 2021-11-05 MED ORDER — VITAMIN B-12 1000 MCG PO TABS
1000.0000 ug | ORAL_TABLET | Freq: Every day | ORAL | Status: DC
  Administered 2021-11-06 – 2021-11-10 (×5): 1000 ug via ORAL
  Filled 2021-11-05 (×5): qty 1

## 2021-11-05 MED ORDER — SODIUM CHLORIDE 0.9% IV SOLUTION: Freq: Once | INTRAVENOUS | Status: AC

## 2021-11-05 MED ORDER — DOCUSATE SODIUM 100 MG PO CAPS: 100.0000 mg | ORAL_CAPSULE | Freq: Two times a day (BID) | ORAL | Status: DC | PRN

## 2021-11-05 MED ORDER — BUDESONIDE 0.25 MG/2ML IN SUSP
0.2500 mg | Freq: Two times a day (BID) | RESPIRATORY_TRACT | Status: DC
  Administered 2021-11-05 – 2021-11-08 (×5): 0.25 mg via RESPIRATORY_TRACT
  Filled 2021-11-05 (×7): qty 2

## 2021-11-05 MED ORDER — HEPARIN SODIUM (PORCINE) 5000 UNIT/ML IJ SOLN
5000.0000 [IU] | Freq: Three times a day (TID) | INTRAMUSCULAR | Status: DC
  Administered 2021-11-05 – 2021-11-09 (×11): 5000 [IU] via SUBCUTANEOUS
  Filled 2021-11-05 (×11): qty 1

## 2021-11-05 MED ORDER — TRAMADOL HCL 50 MG PO TABS: 50.0000 mg | ORAL_TABLET | Freq: Every day | ORAL | Status: DC | PRN

## 2021-11-05 MED ORDER — HYPROMELLOSE (GONIOSCOPIC) 2.5 % OP SOLN
1.0000 [drp] | Freq: Three times a day (TID) | OPHTHALMIC | Status: DC | PRN
  Filled 2021-11-05: qty 15

## 2021-11-05 MED ORDER — LEVOTHYROXINE SODIUM 25 MCG PO TABS
125.0000 ug | ORAL_TABLET | Freq: Every day | ORAL | Status: DC
  Administered 2021-11-06 – 2021-11-10 (×5): 125 ug via ORAL
  Filled 2021-11-05 (×5): qty 1

## 2021-11-05 MED ORDER — IPRATROPIUM BROMIDE 0.02 % IN SOLN
0.5000 mg | Freq: Four times a day (QID) | RESPIRATORY_TRACT | Status: AC
  Administered 2021-11-05 – 2021-11-06 (×3): 0.5 mg via RESPIRATORY_TRACT
  Filled 2021-11-05 (×3): qty 2.5

## 2021-11-05 MED ORDER — NITROGLYCERIN 0.4 MG SL SUBL: 0.4000 mg | SUBLINGUAL_TABLET | SUBLINGUAL | Status: DC | PRN

## 2021-11-05 MED ORDER — ATORVASTATIN CALCIUM 40 MG PO TABS
40.0000 mg | ORAL_TABLET | Freq: Every day | ORAL | Status: DC
  Administered 2021-11-06 – 2021-11-10 (×5): 40 mg via ORAL
  Filled 2021-11-05 (×5): qty 1

## 2021-11-05 MED ORDER — POLYETHYLENE GLYCOL 3350 17 G PO PACK: 17.0000 g | PACK | Freq: Every day | ORAL | Status: DC | PRN

## 2021-11-05 MED ORDER — DOFETILIDE 250 MCG PO CAPS
250.0000 ug | ORAL_CAPSULE | Freq: Two times a day (BID) | ORAL | Status: DC
  Administered 2021-11-06 (×2): 250 ug via ORAL
  Filled 2021-11-05 (×2): qty 1

## 2021-11-05 MED ORDER — DILTIAZEM HCL ER COATED BEADS 240 MG PO CP24
240.0000 mg | ORAL_CAPSULE | Freq: Every day | ORAL | Status: DC
  Administered 2021-11-06 – 2021-11-10 (×5): 240 mg via ORAL
  Filled 2021-11-05 (×5): qty 1

## 2021-11-05 MED ORDER — FERROUS SULFATE 325 (65 FE) MG PO TABS
325.0000 mg | ORAL_TABLET | Freq: Every day | ORAL | Status: DC
  Administered 2021-11-06 – 2021-11-10 (×5): 325 mg via ORAL
  Filled 2021-11-05 (×5): qty 1

## 2021-11-05 MED ORDER — PREDNISONE 20 MG PO TABS
40.0000 mg | ORAL_TABLET | Freq: Every day | ORAL | Status: DC
Start: 2021-11-06 — End: 2021-11-08
  Administered 2021-11-06 – 2021-11-08 (×3): 40 mg via ORAL
  Filled 2021-11-05 (×3): qty 2

## 2021-11-05 MED ORDER — ALBUTEROL SULFATE (2.5 MG/3ML) 0.083% IN NEBU: 10.0000 mg | INHALATION_SOLUTION | Freq: Once | RESPIRATORY_TRACT | Status: DC

## 2021-11-05 MED ORDER — ALBUTEROL (5 MG/ML) CONTINUOUS INHALATION SOLN: 10.0000 mg/h | INHALATION_SOLUTION | Freq: Once | RESPIRATORY_TRACT | Status: DC

## 2021-11-05 MED ORDER — MAGNESIUM OXIDE -MG SUPPLEMENT 400 (240 MG) MG PO TABS
400.0000 mg | ORAL_TABLET | Freq: Every day | ORAL | Status: DC
  Administered 2021-11-06 – 2021-11-10 (×5): 400 mg via ORAL
  Filled 2021-11-05 (×6): qty 1

## 2021-11-05 MED ORDER — LEFLUNOMIDE 20 MG PO TABS
20.0000 mg | ORAL_TABLET | Freq: Every day | ORAL | Status: DC
  Administered 2021-11-06: 20 mg via ORAL
  Filled 2021-11-05: qty 1

## 2021-11-05 MED ORDER — CALCIUM CARBONATE ANTACID 500 MG PO CHEW
1000.0000 mg | CHEWABLE_TABLET | Freq: Every morning | ORAL | Status: DC
  Filled 2021-11-05: qty 2

## 2021-11-05 MED ORDER — IPRATROPIUM BROMIDE 0.02 % IN SOLN
0.5000 mg | RESPIRATORY_TRACT | Status: AC
  Administered 2021-11-05 (×3): 0.5 mg via RESPIRATORY_TRACT

## 2021-11-05 MED ORDER — UMECLIDINIUM BROMIDE 62.5 MCG/ACT IN AEPB: 1.0000 | INHALATION_SPRAY | Freq: Every day | RESPIRATORY_TRACT | Status: DC

## 2021-11-05 MED ORDER — MAGNESIUM SULFATE 2 GM/50ML IV SOLN
2.0000 g | Freq: Once | INTRAVENOUS | Status: AC
  Administered 2021-11-05: 2 g via INTRAVENOUS
  Filled 2021-11-05: qty 50

## 2021-11-05 MED ORDER — ALBUTEROL SULFATE HFA 108 (90 BASE) MCG/ACT IN AERS
2.0000 | INHALATION_SPRAY | Freq: Four times a day (QID) | RESPIRATORY_TRACT | Status: DC | PRN
  Administered 2021-11-08 – 2021-11-09 (×3): 2 via RESPIRATORY_TRACT
  Filled 2021-11-05: qty 6.7

## 2021-11-05 MED ORDER — FUROSEMIDE 10 MG/ML IJ SOLN
40.0000 mg | Freq: Once | INTRAMUSCULAR | Status: AC
  Administered 2021-11-06: 40 mg via INTRAVENOUS
  Filled 2021-11-05: qty 4

## 2021-11-05 MED ORDER — METHYLPREDNISOLONE SODIUM SUCC 125 MG IJ SOLR
125.0000 mg | Freq: Once | INTRAMUSCULAR | Status: AC
  Administered 2021-11-05: 125 mg via INTRAVENOUS
  Filled 2021-11-05: qty 2

## 2021-11-05 MED ORDER — MONTELUKAST SODIUM 10 MG PO TABS
10.0000 mg | ORAL_TABLET | Freq: Every day | ORAL | Status: DC
  Administered 2021-11-06 – 2021-11-10 (×5): 10 mg via ORAL
  Filled 2021-11-05 (×5): qty 1

## 2021-11-05 MED ORDER — PREGABALIN 50 MG PO CAPS
50.0000 mg | ORAL_CAPSULE | Freq: Every day | ORAL | Status: DC
  Administered 2021-11-05 – 2021-11-09 (×5): 50 mg via ORAL
  Filled 2021-11-05 (×5): qty 1

## 2021-11-05 MED ORDER — LINEZOLID 600 MG PO TABS
600.0000 mg | ORAL_TABLET | Freq: Two times a day (BID) | ORAL | Status: DC
  Administered 2021-11-05 – 2021-11-06 (×2): 600 mg via ORAL
  Filled 2021-11-05 (×2): qty 1

## 2021-11-05 MED ORDER — FUROSEMIDE 10 MG/ML IJ SOLN
60.0000 mg | Freq: Once | INTRAMUSCULAR | Status: AC
  Administered 2021-11-05: 60 mg via INTRAVENOUS
  Filled 2021-11-05: qty 6

## 2021-11-05 NOTE — Progress Notes (Signed)
eLink Physician-Brief Progress Note Patient Name: [name] DOB: 1946-09-27 MRN: [id number]   Date of Service  11/05/2021  HPI/Events of Note  Patient needs a K+ supplement to bring K+ above 4.0 prior to receiving Dofetilide.  eICU Interventions  KCL 20 meq po x 1 ordered.        Thomasene Lot [name] 11/05/2021, 11:53 PM

## 2021-11-05 NOTE — ED Triage Notes (Signed)
Pt bib [facility] with c/o shob x 3 days. Pt received 3 duonebs and 125 solumedrol en route. PT denies CP

## 2021-11-05 NOTE — ED Notes (Signed)
Patient off BiPAP . On 30% O2, RR 26 BBS decreased/exp wheezes/coarse crackles. Placed back on BiPAP per EDM covering.

## 2021-11-05 NOTE — ED Notes (Signed)
[facility] at bedside 

## 2021-11-05 NOTE — ED Provider Notes (Addendum)
[facility] Provider Note   CSN: [id number] Arrival date & time: 11/05/21  0845     History  Chief Complaint  Patient presents with   Shortness of Breath         [name] is a 76 y.o. female.  HPI     76 year old female with history of atrial fibrillation on apixaban, Barrett's esophagus, coronary artery disease status post CABG x3, CHF, COPD on chronic 2 L of oxygen, hypertension, OSA on CPAP, GERD, hyperlipidemia, hypothyroidism, intracranial hemorrhage, interstitial lung disease, obesity, CKD, psoriatic arthritis, admission in December for periprosthetic infection in the setting of left hip replacement, admission December 20 for shortness of breath and chest pain found to have acute on chronic respiratory failure with hypoxia and hypercapnia multifactorial secondary to COVID-19 infection, COPD exacerbation and CHF who presents with concern for shortness of breath.   Shortness of breath for a few days Using nebulizers at home, has had swelling of legs and bilateral hands Coughing, not a whole lot but has been No fever On torsemide, gave extra dose last night Eliquis Chest pain began this AM, resolved with nitro  Daughter providing history given patient somnolent Daughter reports she has been sleeping a lot the last few days  No black or bloody stools, no diarrhea, no vomiting  Home Medications Prior to Admission medications   Medication Sig Start Date End Date Taking? Authorizing Provider  acetaminophen (TYLENOL) 650 MG CR tablet Take 1,300 mg by mouth 2 (two) times daily.   Yes [provider]  albuterol (PROVENTIL) (2.5 MG/3ML) 0.083% nebulizer solution Take 2.5 mg by nebulization every 6 (six) hours as needed for wheezing or shortness of breath.   Yes [provider]  albuterol (VENTOLIN HFA) 108 (90 Base) MCG/ACT inhaler Inhale 2 puffs into the lungs every 4 (four) hours as needed (shortness of breath, if you  can't catch your breath). 06/04/20  Yes Tanda Rockers, MD  apixaban (ELIQUIS) 5 MG TABS tablet Take 1 tablet (5 mg total) by mouth 2 (two) times daily. 06/17/21  Yes Fenton, Clint R, PA  atorvastatin (LIPITOR) 40 MG tablet Take 40 mg by mouth daily.   Yes [provider]  budesonide (PULMICORT) 0.25 MG/2ML nebulizer solution One twice daily Patient taking differently: Take 0.25 mg by nebulization 2 (two) times daily. 07/02/20  Yes Tanda Rockers, MD  Calcium Carbonate Antacid (TUMS ULTRA 1000 PO) Take 1,000 mg by mouth every morning.   Yes [provider]  Cholecalciferol (VITAMIN D3) 2000 UNITS TABS Take 2,000 Units by mouth daily.   Yes [provider]  diltiazem (CARDIZEM CD) 240 MG 24 hr capsule Take 1 capsule (240 mg total) by mouth daily. 09/11/21  Yes Chandrasekhar, Mahesh A, MD  dofetilide (TIKOSYN) 250 MCG capsule Take 1 capsule by mouth twice daily Patient taking differently: Take 250 mcg by mouth 2 (two) times daily. 08/20/21  Yes Shirley Friar, PA-C  DULoxetine (CYMBALTA) 30 MG capsule Take 30 mg by mouth daily.   Yes [provider]  ferrous sulfate 325 (65 FE) MG tablet Take 1 tablet (325 mg total) by mouth daily with breakfast. 01/10/21 10/07/22 Yes Chandrasekhar, Mahesh A, MD  formoterol (PERFOROMIST) 20 MCG/2ML nebulizer solution Take 2 mLs (20 mcg total) by nebulization 2 (two) times daily. Use in nebulizer twice daily perfectly regularly 07/02/20  Yes Tanda Rockers, MD  golimumab (SIMPONI ARIA) 50 MG/4ML SOLN injection Inject 50 mg into the vein every 8 (eight)  weeks.    Yes [provider]  hydrocortisone 2.5 % cream Apply 1 application topically daily as needed for itching (psoriasis).   Yes [provider]  ketoconazole (NIZORAL) 2 % cream Apply 1 application topically daily as needed for irritation (psoriasis). 01/30/21  Yes [provider]  leflunomide (ARAVA) 20 MG tablet Take 1 tablet (20 mg total) by mouth  daily. 03/15/19  Yes Isaac Bliss, Rayford Halsted, MD  levothyroxine (SYNTHROID) 125 MCG tablet TAKE 1 TABLET BY MOUTH EVERY DAY Patient taking differently: Take 125 mcg by mouth daily before breakfast. 06/20/20  Yes Isaac Bliss, Rayford Halsted, MD  linezolid (ZYVOX) 600 MG tablet Take 1 tablet (600 mg total) by mouth every 12 (twelve) hours. 10/16/21 11/15/21 Yes Edmisten, Kristie L, PA  losartan (COZAAR) 50 MG tablet Take 1 tablet (50 mg total) by mouth daily. 01/17/21  Yes Chandrasekhar, Mahesh A, MD  Magnesium Oxide 400 MG CAPS Take 1 capsule (400 mg total) by mouth daily. 06/06/21  Yes Chandrasekhar, Mahesh A, MD  metoprolol tartrate (LOPRESSOR) 25 MG tablet Take 0.5 tablets (12.5 mg total) by mouth 2 (two) times daily. 10/31/21  Yes Isaac Bliss, Rayford Halsted, MD  montelukast (SINGULAIR) 10 MG tablet Take 10 mg by mouth daily. 03/22/21  Yes [provider]  nitroGLYCERIN (NITROSTAT) 0.4 MG SL tablet Place 1 tablet (0.4 mg total) under the tongue every 5 (five) minutes as needed for chest pain. 05/24/21  Yes Chandrasekhar, Mahesh A, MD  nystatin (MYCOSTATIN/NYSTOP) powder APPLY 1 APPLICATION        TOPICALLY 3 TIMES A DAY Patient taking differently: Apply 1 application topically daily as needed (psoriasis). 03/07/21  Yes Isaac Bliss, Rayford Halsted, MD  ondansetron (ZOFRAN-ODT) 4 MG disintegrating tablet Take 4 mg by mouth daily as needed (with each dose of hydrocodone). 10/01/21  Yes [provider]  OXYGEN Inhale 3 L into the lungs continuous.   Yes [provider]  pantoprazole (PROTONIX) 40 MG tablet Take 1 tablet (40 mg total) by mouth daily before breakfast. 01/10/21  Yes Tanda Rockers, MD  pregabalin (LYRICA) 50 MG capsule Take 50 mg by mouth at bedtime. 03/13/21  Yes [provider]  torsemide (DEMADEX) 20 MG tablet Take 1 tablet (20 mg total) by mouth 2 (two) times daily. 06/21/21  Yes Mariel Aloe, MD  traMADol (ULTRAM) 50 MG tablet Take 50 mg by mouth daily as  needed (pain).   Yes [provider]  triamcinolone cream (KENALOG) 0.1 % Apply 1 application topically 2 (two) times daily as needed (for psoriasis).    Yes [provider]  umeclidinium bromide (INCRUSE ELLIPTA) 62.5 MCG/INH AEPB Inhale 1 puff into the lungs daily. 03/14/21  Yes Tanda Rockers, MD  vitamin B-12 (CYANOCOBALAMIN) 1000 MCG tablet Take 1,000 mcg by mouth daily.   Yes [provider]      Allergies    Gabapentin    Review of Systems   Review of Systems  Constitutional:  Positive for activity change, appetite change and fatigue.  Respiratory:  Positive for cough and shortness of breath.   Cardiovascular:  Positive for chest pain and leg swelling.   Physical Exam Updated Vital Signs BP (!) 156/145 Comment: recheck   Pulse (!) 102    Temp 98.8 F (37.1 C) (Oral)    Resp (!) 23    Ht 5' (1.524 m)    Wt 94.1 kg    SpO2 99%    BMI 40.52 kg/m  Physical Exam  Vitals and nursing note reviewed.  Constitutional:      General: She is not in acute distress.    Appearance: She is well-developed. She is ill-appearing and toxic-appearing. She is not diaphoretic.  HENT:     Head: Normocephalic and atraumatic.  Eyes:     Conjunctiva/sclera: Conjunctivae normal.  Cardiovascular:     Rate and Rhythm: Normal rate and regular rhythm.     Heart sounds: Normal heart sounds. No murmur heard.   No friction rub. No gallop.  Pulmonary:     Effort: Pulmonary effort is normal. No respiratory distress.     Breath sounds: Decreased breath sounds, wheezing and rales present.  Abdominal:     General: There is no distension.     Palpations: Abdomen is soft.     Tenderness: There is no abdominal tenderness. There is no guarding.  Musculoskeletal:        General: No tenderness.     Cervical back: Normal range of motion.  Skin:    General: Skin is warm and dry.     Findings: No erythema or rash.  Neurological:     Mental Status: She is alert and oriented to person,  place, and time.     Comments: Sleepy, awakens to answer questions then closes eyes again    ED Results / Procedures / Treatments   Labs (all labs ordered are listed, but only abnormal results are displayed) Labs Reviewed  RESP PANEL BY RT-PCR (FLU A&B, COVID) ARPGX2 - Abnormal; Notable for the following components:      Result Value   SARS Coronavirus 2 by RT PCR POSITIVE (*)    All other components within normal limits  CBC WITH DIFFERENTIAL/PLATELET - Abnormal; Notable for the following components:   RBC 2.32 (*)    Hemoglobin 6.9 (*)    HCT 22.7 (*)    RDW 17.6 (*)    Platelets 102 (*)    nRBC 0.7 (*)    All other components within normal limits  COMPREHENSIVE METABOLIC PANEL - Abnormal; Notable for the following components:   Chloride 96 (*)    Glucose, Bld 145 (*)    BUN 33 (*)    Creatinine, Ser 1.82 (*)    Albumin 3.2 (*)    GFR, Estimated 29 (*)    All other components within normal limits  BRAIN NATRIURETIC PEPTIDE - Abnormal; Notable for the following components:   B Natriuretic Peptide 607.4 (*)    All other components within normal limits  LACTIC ACID, PLASMA - Abnormal; Notable for the following components:   Lactic Acid, Venous 3.0 (*)    All other components within normal limits  CBC WITH DIFFERENTIAL/PLATELET - Abnormal; Notable for the following components:   RBC 2.29 (*)    Hemoglobin 6.7 (*)    HCT 22.3 (*)    RDW 17.3 (*)    Platelets 91 (*)    nRBC 0.5 (*)    Lymphs Abs 0.2 (*)    All other components within normal limits  GLUCOSE, CAPILLARY - Abnormal; Notable for the following components:   Glucose-Capillary 155 (*)    All other components within normal limits  I-STAT VENOUS BLOOD GAS, ED - Abnormal; Notable for the following components:   pH, Ven 7.198 (*)    pCO2, Ven 95.8 (*)    Bicarbonate 37.3 (*)    TCO2 40 (*)    Acid-Base Excess 8.0 (*)    Calcium, Ion 1.10 (*)    HCT 23.0 (*)  Hemoglobin 7.8 (*)    All other components within  normal limits  I-STAT VENOUS BLOOD GAS, ED - Abnormal; Notable for the following components:   pCO2, Ven 80.4 (*)    Bicarbonate 35.9 (*)    TCO2 38 (*)    Acid-Base Excess 8.0 (*)    Calcium, Ion 1.07 (*)    HCT 16.0 (*)    Hemoglobin 5.4 (*)    All other components within normal limits  TROPONIN I (HIGH SENSITIVITY) - Abnormal; Notable for the following components:   Troponin I (High Sensitivity) 23 (*)    All other components within normal limits  TROPONIN I (HIGH SENSITIVITY) - Abnormal; Notable for the following components:   Troponin I (High Sensitivity) 22 (*)    All other components within normal limits  MRSA NEXT GEN BY PCR, NASAL  CULTURE, BLOOD (ROUTINE X 2)  CULTURE, BLOOD (ROUTINE X 2)  LACTIC ACID, PLASMA  OCCULT BLOOD X 1 CARD TO LAB, STOOL  CBC  BASIC METABOLIC PANEL  MAGNESIUM  PHOSPHORUS  BLOOD GAS, ARTERIAL  BASIC METABOLIC PANEL  MAGNESIUM  TYPE AND SCREEN  PREPARE RBC (CROSSMATCH)    EKG EKG Interpretation  Date/Time:  Tuesday November 05 2021 08:50:04 EST Ventricular Rate:  82 PR Interval:  195 QRS Duration: 103 QT Interval:  427 QTC Calculation: 499 R Axis:   78 Text Interpretation: Sinus rhythm Atrial premature complex Low voltage, precordial leads RSR' in V1 or V2, probably normal variant Borderline prolonged QT interval No significant change since last tracing Confirmed by Alvira Monday ([id number]) on 11/05/2021 11:12:59 AM  Radiology DG Chest Portable 1 View  Result Date: 11/05/2021 CLINICAL DATA:  Cough with shortness of breath for 3 days. COVID-19 infection last month. EXAM: PORTABLE CHEST 1 VIEW COMPARISON:  Radiographs 10/20/2021 and 10/07/2021.  CT 12/27/2020. FINDINGS: 1014 hours. Stable cardiomegaly status post median sternotomy and CABG. Slight interval improvement in bilateral interstitial prominence, suggesting improving edema. Probable trace bilateral pleural effusions. No confluent airspace opacity or pneumothorax. The bones appear  unchanged. Multiple telemetry leads overlie the chest. IMPRESSION: Interval slight improvement in diffuse interstitial prominence, suggesting resolving edema. No focal airspace disease. Electronically Signed   By: Carey Bullocks M.D.   On: 11/05/2021 10:24    Procedures .Critical Care Performed by: Alvira Monday, MD Authorized by: Alvira Monday, MD   Critical care provider statement:    Critical care time (minutes):  30   Critical care was time spent personally by me on the following activities:  Development of treatment plan with patient or surrogate, discussions with consultants, evaluation of patient's response to treatment, examination of patient, ordering and review of laboratory studies, ordering and review of radiographic studies, ordering and performing treatments and interventions, pulse oximetry, re-evaluation of patient's condition and review of old charts    Medications Ordered in ED Medications  0.9 %  sodium chloride infusion ( Intravenous Stopped 11/05/21 1551)  docusate sodium (COLACE) capsule 100 mg (has no administration in time range)  polyethylene glycol (MIRALAX / GLYCOLAX) packet 17 g (has no administration in time range)  heparin injection 5,000 Units (5,000 Units Subcutaneous Given 11/05/21 2128)  0.9 %  sodium chloride infusion (Manually program via Guardrails IV Fluids) (has no administration in time range)  albuterol (VENTOLIN HFA) 108 (90 Base) MCG/ACT inhaler 2 puff (has no administration in time range)  atorvastatin (LIPITOR) tablet 40 mg (has no administration in time range)  budesonide (PULMICORT) nebulizer solution 0.25 mg (0.25 mg Nebulization Given 11/05/21 2127)  calcium carbonate (TUMS - dosed in mg elemental calcium) chewable tablet 1,000 mg (has no administration in time range)  cholecalciferol (VITAMIN D3) tablet 2,000 Units (has no administration in time range)  diltiazem (CARDIZEM CD) 24 hr capsule 240 mg (has no administration in time range)   DULoxetine (CYMBALTA) DR capsule 30 mg (has no administration in time range)  ferrous sulfate tablet 325 mg (has no administration in time range)  arformoterol (BROVANA) nebulizer solution 15 mcg (15 mcg Nebulization Given 11/05/21 2128)  hydroxypropyl methylcellulose / hypromellose (ISOPTO TEARS / GONIOVISC) 2.5 % ophthalmic solution 1 drop (has no administration in time range)  leflunomide (ARAVA) tablet 20 mg (has no administration in time range)  levothyroxine (SYNTHROID) tablet 125 mcg (has no administration in time range)  linezolid (ZYVOX) tablet 600 mg (600 mg Oral Given 11/05/21 2128)  losartan (COZAAR) tablet 50 mg (has no administration in time range)  magnesium oxide (MAG-OX) tablet 400 mg (has no administration in time range)  metoprolol tartrate (LOPRESSOR) tablet 12.5 mg (12.5 mg Oral Given 11/05/21 2128)  montelukast (SINGULAIR) tablet 10 mg (has no administration in time range)  nitroGLYCERIN (NITROSTAT) SL tablet 0.4 mg (has no administration in time range)  pantoprazole (PROTONIX) EC tablet 40 mg (has no administration in time range)  pregabalin (LYRICA) capsule 50 mg (50 mg Oral Given 11/05/21 2128)  torsemide (DEMADEX) tablet 20 mg (has no administration in time range)  traMADol (ULTRAM) tablet 50 mg (has no administration in time range)  vitamin B-12 (CYANOCOBALAMIN) tablet 1,000 mcg (has no administration in time range)  ipratropium (ATROVENT) nebulizer solution 0.5 mg (0.5 mg Nebulization Given 11/05/21 2128)  dofetilide (TIKOSYN) capsule 250 mcg (has no administration in time range)  methylPREDNISolone sodium succinate (SOLU-MEDROL) 125 mg/2 mL injection 125 mg (125 mg Intravenous Given 11/05/21 1125)  ipratropium (ATROVENT) nebulizer solution 0.5 mg (0.5 mg Nebulization Given 11/05/21 1200)  magnesium sulfate IVPB 2 g 50 mL (0 g Intravenous Stopped 11/05/21 1217)  azithromycin (ZITHROMAX) 500 mg in sodium chloride 0.9 % 250 mL IVPB (0 mg Intravenous Stopped 11/05/21 1418)  albuterol  (PROVENTIL) (2.5 MG/3ML) 0.083% nebulizer solution 10 mg (10 mg Nebulization Given 11/05/21 1046)  furosemide (LASIX) injection 60 mg (60 mg Intravenous Given 11/05/21 1253)    ED Course/ Medical Decision Making/ A&P                           Medical Decision Making   76 year old female with history of atrial fibrillation on apixaban, Barrett's esophagus, coronary artery disease status post CABG x3, CHF, COPD on chronic 2 L of oxygen, hypertension, OSA on CPAP, GERD, hyperlipidemia, hypothyroidism, intracranial hemorrhage, interstitial lung disease, obesity, CKD, psoriatic arthritis, admission in December for periprosthetic infection in the setting of left hip replacement, admission December 20 for shortness of breath and chest pain found to have acute on chronic respiratory failure with hypoxia and hypercapnia multifactorial secondary to COVID-19 infection, COPD exacerbation and CHF who presents with concern for shortness of breath.    Differential diagnosis for dyspnea includes ACS, PE, COPD exacerbation, CHF exacerbation, anemia, pneumonia, viral etiology such as COVID 19 infection, metabolic abnormality.  Chest x-ray was done and evaluated by me which showed slight interval improvement in diffuse interstitial prominence and comparison to previous, suggesting resolving edema. EKG was evaluated by me which showed no acute changes in comparison to prior.  BNP in 600s, increased from prior.  Given lasix 60mg  IV.  Have low suspicion for  pulmonary embolus given she is on current anticoagulation.  Troponin mildly elevated at 23, suspect secondary to acute stress, doubt ACS but will continue to follow.  Presentation most consistent with multifactorial respiratory failure secondary to COPD and CHF.  Labs are significant for new anemia with a hemoglobin of 6.9. Discussed need for transfusion, however we are not able to perform type and screen or transfuse matched blood at this facility.  No hx of black or  bloody stool, not able to obtain significant sample on rectal exam however is hemoccult negative, CBC shows stable hgb, do not suspect acute bleed as etiology of anemia.   VBG is significant for hypercarbic respiratory failure with a pH of 7.198 and a PCO2 of 95.8.  She was placed on BiPAP on arrival and given albuterol, Atrovent, additional Solu-Medrol and magnesium.  Repeat VBG shows improvement.  Given significant symptoms, high risk for intubation (with daughter stating she would want intubation for short time if she worsens), will admit to ICU.  COVID positive 16 days after initial positive test.          Final Clinical Impression(s) / ED Diagnoses Final diagnoses:  COPD exacerbation ([place])  Acute on chronic congestive heart failure, unspecified heart failure type ([place])  Anemia, unspecified type  Hypercarbia    Rx / DC Orders ED Discharge Orders     None         Gareth Morgan, MD 11/05/21 2133    Gareth Morgan, MD 11/05/21 2134

## 2021-11-05 NOTE — H&P (Signed)
NAME:  [name], MRN:  [id number], DOB:  [birth year], LOS: 0 ADMISSION DATE:  11/05/2021, CONSULTATION DATE:  11/05/2021 REFERRING MD:  Dr. Gareth Morgan - [facility] EDP CHIEF COMPLAINT:  SOB   History of Present Illness:  76 year old woman who presented to [facility] 1/3 for worsening SOB in the setting of recent COVID 19 infection. PMHx significant for HTN, HLD, CAD (s/p CABG x 3), atrial fibrillation (on Eliquis), CHF (Echo 08/2021 65-70%), COPD (on HOT 2L), ILD, OSA on CPAP, intracranial hemorrhage (2019), Barrett's esophagus, GERD, hypothyroidism, CKD stage IIIb, depression, psoriatic arthritis. 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   Patient returned to [facility] 1/3 with worsening SOB and associated CP x 3 days. CP resolved with nitro. She reports using her inhalers consistently at home. Reported some swelling in both extremities and cough but denies any fever, chills or known sick contacts  PCCM asked to assume care due to acute-on-chronic hypercarbic respiratory failure with a pH of 7.198 and a PCO2 of 95.8. S/p BiPAP with improved VBG. Transferred to [facility].  Pertinent Medical History:  A. fib, Barrett's esophagus, CAD status post CABG x3, COPD, chronic hypercarbic hypoxic respiratory failure on 2 L O2 at home, OSA on CPAP, GERD, HTN, HLD, hypothyroidism, interstitial lung disease, obesity, CKD 3B, depression, psoriatic arthritis and intracranial hemorrhage  Significant Hospital Events: Including procedures, antibiotic start and stop dates in addition to other pertinent events   1/3 Transferred from [facility] to [facility] 32M. CXR showed slight improvement in pulmonary edema. BiPAP initiated with  significant improvement in VBG and mental status. Weaned O2 needs down to [place] 4L (baseline 2L).  Interim History / Subjective:  PCCM consulted for admission/transfer from [facility]  Objective:  Blood pressure (!) 147/57, pulse 92, temperature 98.7 F (37.1 C), temperature source Oral, resp. rate (!) 21, height 4\' 11"  (1.499 m), weight 90.7 kg, SpO2 98 %.        Intake/Output Summary (Last 24 hours) at 11/05/2021 1418 Last data filed at 11/05/2021 1225 Gross per 24 hour  Intake 50 ml  Output --  Net 50 ml   Filed Weights   11/05/21 0848  Weight: 90.7 kg   Physical Examination: General: Acute-on-chronically ill-appearing elderly woman in NAD. HEENT: [place]/AT, anicteric sclera, PERRL, moist mucous membranes. [place] in place. Neuro: Awake, oriented x 4. Responds to verbal stimuli. Following commands consistently. Moves all 4 extremities spontaneously.  CV: Sinus tachycardia, no m/g/r. PULM: Breathing even and mildly labored on 4LNC. Pursed-lip breathing. Lung fields with diffuse bilateral wheezing. GI: Soft, nontender, nondistended. Normoactive bowel sounds. Extremities: Bilateral symmetric 1+ UE/LE edema noted. Skin: Warm/dry, no rashes.  Assessment & Plan:  Acute on chronic hypoxic and hypercarbic respiratory failure COPD exacerbation COVID-19 infection OSA on CPAP Significantly improved respiratory status, s/p BiPAP initiation, now weaned to HFNC and [place]. - BiPAP support PRN + QHS (alternative CPAP, uses Trilogy at home) - Wean O2 for sat > 88% - Pulmonary hygiene - Continue bronchodilators - Continue Singulair - Follow CXR - ABG in AM - Received azithromycin in ED  Lactic acidosis - Trend LA to normal - IV fluid resuscitation - Goal MAP > 65  - F/u pending Cx data  HFpEF Echo on 08/07/2021 showed EF 65 to 70%,  mild LVH and grade 2 diastolic dysfunction. Torsemide 20mg  BID at home. - Diuresis as tolerated - Continue home torsemide - Consider Cardiology consult if clinically  worsening  Acute on chronic symptomatic anemia Baseline hemoglobin ~7-8.  Found to have hemoglobin of 6.9 in the ED. - T&S completed on arrival to [facility] - 1U PRBCs ordered in the setting of Hgb 6.7 and SOB - Trend H&H - Transfuse for Hgb < 7.0  AKI on CKD IIIb Baseline creatinine around 1.5. - Trend BMP - Replete electrolytes as indicated - Monitor I&Os - Avoid nephrotoxic agents as able - Ensure adequate renal perfusion  CAD status post CABG x3 Troponin minimal trending down. Chest pain resolved with nitro - Continue NTG PRN - Cardiac monitoring  A. fib on Eliquis - Continue diltiazem, metoprolol BP permitting - Home Eliquis held in the setting of anemia - Resume once stabilized/clinically appropriate  Periprosthetic infection Status post periprosthetic hip fracture and infection with wound culture growing MRSA. - Continue Linezolid  Best Practice: (right click and "Reselect all SmartList Selections" daily)   Diet/type: Regular consistency (see orders) DVT prophylaxis: prophylactic heparin , resume home Eliquis when appropriate  GI prophylaxis: PPI Lines: N/A Foley:  N/A Code Status:  limited - no CPR, ACLS or defib; short-term intubation trial if necessary  Last date of multidisciplinary goals of care discussion [1/3 - Partial Code, as above]  Labs   CBC: Recent Labs  Lab 11/05/21 1024 11/05/21 1037 11/05/21 1238  WBC 7.5  --   --   NEUTROABS 6.2  --   --   HGB 6.9* 7.8* 5.4*  HCT 22.7* 23.0* 16.0*  MCV 97.8  --   --   PLT 102*  --   --    Basic Metabolic Panel: Recent Labs  Lab 11/05/21 1024 11/05/21 1037 11/05/21 1238  NA 140 138 139  K 4.1 3.9 3.7  CL 96*  --   --   CO2 31  --   --   GLUCOSE 145*  --   --   BUN 33*  --   --   CREATININE 1.82*  --   --   CALCIUM 9.2  --   --    GFR: Estimated Creatinine Clearance: 26.6 mL/min (A) (by C-G formula based on SCr of 1.82 mg/dL (H)). Recent Labs  Lab 11/05/21 1024  WBC 7.5  LATICACIDVEN 1.4    Liver Function Tests: Recent Labs  Lab 11/05/21 1024  AST 18  ALT 15  ALKPHOS 63  BILITOT 0.6  PROT 6.7  ALBUMIN 3.2*   No results for input(s): LIPASE, AMYLASE in the last 168 hours. No results for input(s): AMMONIA in the last 168 hours.  ABG:    Component Value Date/Time   PHART 7.287 (L) 10/20/2021 2059   PCO2ART 73.6 (HH) 10/20/2021 2059   PO2ART 92.1 10/20/2021 2059   HCO3 35.9 (H) 11/05/2021 1238   TCO2 38 (H) 11/05/2021 1238   ACIDBASEDEF 2.0 06/05/2015 0119   O2SAT 68.0 11/05/2021 1238    Coagulation Profile: No results for input(s): INR, PROTIME in the last 168 hours.  Cardiac Enzymes: No results for input(s): CKTOTAL, CKMB, CKMBINDEX, TROPONINI in the last 168 hours.  HbA1C: Hgb A1c MFr Bld  Date/Time Value Ref Range Status  08/25/2018 08:19 PM 5.3 4.8 - 5.6 % Final    Comment:    (NOTE) Pre diabetes:          5.7%-6.4% Diabetes:              >  6.4% Glycemic control for   <7.0% adults with diabetes   05/31/2015 09:29 PM 5.8 (H) 4.8 - 5.6 % Final    Comment:    (NOTE)         Pre-diabetes: 5.7 - 6.4         Diabetes: >6.4         Glycemic control for adults with diabetes: <7.0    CBG: No results for input(s): GLUCAP in the last 168 hours.  Review of Systems:   Review of systems completed with pertinent positives/negatives outlined in above HPI.  Past Medical History:  She,  has a past medical history of Anemia, Arthritis, Barrett esophagus, BiPAP (biphasic positive airway pressure), Bradycardia (06/01/2015), CAD in native artery, Chronic diastolic (congestive) heart failure ([place]), Chronic kidney disease, stage 3a ([place]), Chronic respiratory failure ([place]), Chronic respiratory failure with hypoxia and hypercapnia ([place]) (02/04/2010), COPD III spirometry if use FEV1/VC p saba  (07/18/2010), Cough variant asthma (02/26/2011), Depression, Dysrhythmia, Essential hypertension (04/20/2007), GERD (gastroesophageal reflux disease), History of ARDS (2006),  History of home oxygen therapy, Hyperlipidemia (07/12/2015), Hypothyroidism, Intracranial hemorrhage ([place]) (2019), Mild carotid artery disease ([place]), Morbid (severe) obesity due to excess calories ([place]) (04/22/2015), NSTEMI (non-ST elevated myocardial infarction) ([place]) (05/31/2015), Pneumococcal pneumonia ([place]) (2006), Psoriasis, Psoriatic arthritis ([place]), PULMONARY FIBROSIS ILD POST INFLAMMATORY CHRONIC (07/18/2010), Rhematoid arthritis, S/P CABG x 3 (06/04/2015), and SOB (shortness of breath) on exertion.   Surgical History:   Past Surgical History:  Procedure Laterality Date   ABDOMINOPLASTY     CARDIAC CATHETERIZATION N/A 06/01/2015   Procedure: Left Heart Cath and Coronary Angiography;  Surgeon: Lorretta Harp, MD;  Location: [place] [facility] LAB;  Service: Cardiovascular;  Laterality: N/A;   CARDIOVERSION N/A 02/12/2021   Procedure: CARDIOVERSION;  Surgeon: Freada Bergeron, MD;  Location: [facility] ENDOSCOPY;  Service: Cardiovascular;  Laterality: N/A;   CARPAL TUNNEL RELEASE     CHOLECYSTECTOMY     CORONARY ARTERY BYPASS GRAFT N/A 06/04/2015   Procedure: CORONARY ARTERY BYPASS GRAFT times three            with left internal mammary artery and right leg saphenous vein;  Surgeon: Gaye Pollack, MD;  Location: [place] OR;  Service: Open Heart Surgery;  Laterality: N/A;   cosmetic breast surgery     EYE SURGERY     cataract   INCISION AND DRAINAGE HIP Left 10/10/2021   Procedure: IRRIGATION AND DEBRIDEMENT HIP WITH WOUND VAC;  Surgeon: Gaynelle Arabian, MD;  Location: [facility] ORS;  Service: Orthopedics;  Laterality: Left;   JOINT REPLACEMENT     KNEE ARTHROSCOPY Left    TEE WITHOUT CARDIOVERSION  06/04/2015   Procedure: TRANSESOPHAGEAL ECHOCARDIOGRAM (TEE);  Surgeon: Gaye Pollack, MD;  Location: [facility] OR;  Service: Open Heart Surgery;;   TEE WITHOUT CARDIOVERSION N/A 02/12/2021   Procedure: TRANSESOPHAGEAL ECHOCARDIOGRAM (TEE);  Surgeon: Freada Bergeron, MD;  Location: [facility] ENDOSCOPY;  Service:  Cardiovascular;  Laterality: N/A;   TOTAL HIP ARTHROPLASTY Left 09/16/2021   Procedure: TOTAL HIP ARTHROPLASTY ANTERIOR APPROACH;  Surgeon: Gaynelle Arabian, MD;  Location: [facility] ORS;  Service: Orthopedics;  Laterality: Left;   TOTAL KNEE ARTHROPLASTY Left    TOTAL KNEE ARTHROPLASTY Right 06/06/2013   Procedure: RIGHT TOTAL KNEE ARTHROPLASTY;  Surgeon: Gearlean Alf, MD;  Location: [facility] ORS;  Service: Orthopedics;  Laterality: Right;    Social History:   reports that she quit smoking about 16 years ago. Her smoking use included cigarettes. She has a 87.75 pack-year smoking history. She  has never used smokeless tobacco. She reports that she does not drink alcohol and does not use drugs.   Family History:  Her family history includes Breast cancer in her mother; Coronary artery disease in her father; Rheum arthritis in her father.   Allergies: Allergies  Allergen Reactions   Gabapentin Other (See Comments)    Dizziness, lighthead    Home Medications: Prior to Admission medications   Medication Sig Start Date End Date Taking? Authorizing Provider  acetaminophen (TYLENOL) 650 MG CR tablet Take 1,300 mg by mouth 2 (two) times daily.    [provider]  albuterol (PROVENTIL) (2.5 MG/3ML) 0.083% nebulizer solution Take 2.5 mg by nebulization every 6 (six) hours as needed for wheezing or shortness of breath.    [provider]  albuterol (VENTOLIN HFA) 108 (90 Base) MCG/ACT inhaler Inhale 2 puffs into the lungs every 4 (four) hours as needed (shortness of breath, if you can't catch your breath). 06/04/20   Tanda Rockers, MD  apixaban (ELIQUIS) 5 MG TABS tablet Take 1 tablet (5 mg total) by mouth 2 (two) times daily. 06/17/21   Fenton, Clint R, PA  atorvastatin (LIPITOR) 40 MG tablet Take 40 mg by mouth daily.    [provider]  budesonide (PULMICORT) 0.25 MG/2ML nebulizer solution One twice daily Patient taking differently: Take 0.25 mg by nebulization 2 (two) times daily.  07/02/20   Tanda Rockers, MD  Calcium Carbonate Antacid (TUMS ULTRA 1000 PO) Take 1,000 mg by mouth every morning.    [provider]  Cholecalciferol (VITAMIN D3) 2000 UNITS TABS Take 2,000 Units by mouth daily.    [provider]  diltiazem (CARDIZEM CD) 240 MG 24 hr capsule Take 1 capsule (240 mg total) by mouth daily. 09/11/21   Werner Lean, MD  dofetilide (TIKOSYN) 250 MCG capsule Take 1 capsule by mouth twice daily Patient taking differently: Take 250 mcg by mouth 2 (two) times daily. 08/20/21   Shirley Friar, PA-C  DULoxetine (CYMBALTA) 30 MG capsule Take 30 mg by mouth daily.    [provider]  ferrous sulfate 325 (65 FE) MG tablet Take 1 tablet (325 mg total) by mouth daily with breakfast. 01/10/21 10/07/22  Chandrasekhar, Lyda Kalata A, MD  formoterol (PERFOROMIST) 20 MCG/2ML nebulizer solution Take 2 mLs (20 mcg total) by nebulization 2 (two) times daily. Use in nebulizer twice daily perfectly regularly 07/02/20   Tanda Rockers, MD  golimumab (SIMPONI ARIA) 50 MG/4ML SOLN injection Inject 50 mg into the vein every 8 (eight) weeks.     [provider]  hydrocortisone 2.5 % cream Apply 1 application topically daily as needed for itching (psoriasis).    [provider]  hydroxypropyl methylcellulose / hypromellose (ISOPTO TEARS / GONIOVISC) 2.5 % ophthalmic solution Place 1 drop into both eyes 3 (three) times daily as needed for dry eyes.    [provider]  ketoconazole (NIZORAL) 2 % cream Apply 1 application topically daily as needed for irritation (psoriasis). 01/30/21   [provider]  leflunomide (ARAVA) 20 MG tablet Take 1 tablet (20 mg total) by mouth daily. 03/15/19   Isaac Bliss, Rayford Halsted, MD  levothyroxine (SYNTHROID) 125 MCG tablet TAKE 1 TABLET BY MOUTH EVERY DAY Patient taking differently: Take 125 mcg by mouth daily before breakfast. 06/20/20   Isaac Bliss, Rayford Halsted, MD  linezolid (ZYVOX) 600  MG tablet Take 1 tablet (600 mg total) by mouth every 12 (twelve) hours. 10/16/21 11/15/21  Theresa Duty  L, PA  losartan (COZAAR) 50 MG tablet Take 1 tablet (50 mg total) by mouth daily. 01/17/21   Werner Lean, MD  Magnesium Oxide 400 MG CAPS Take 1 capsule (400 mg total) by mouth daily. 06/06/21   Rudean Haskell A, MD  metoprolol tartrate (LOPRESSOR) 25 MG tablet Take 0.5 tablets (12.5 mg total) by mouth 2 (two) times daily. 10/31/21   Isaac Bliss, Rayford Halsted, MD  montelukast (SINGULAIR) 10 MG tablet Take 10 mg by mouth daily. 03/22/21   [provider]  nitroGLYCERIN (NITROSTAT) 0.4 MG SL tablet Place 1 tablet (0.4 mg total) under the tongue every 5 (five) minutes as needed for chest pain. 05/24/21   Chandrasekhar, Lyda Kalata A, MD  nystatin (MYCOSTATIN/NYSTOP) powder APPLY 1 APPLICATION        TOPICALLY 3 TIMES A DAY Patient taking differently: Apply 1 application topically daily as needed (psoriasis). 03/07/21   Isaac Bliss, Rayford Halsted, MD  ondansetron (ZOFRAN-ODT) 4 MG disintegrating tablet Take 4 mg by mouth daily as needed (with each dose of hydrocodone). 10/01/21   [provider]  OXYGEN Inhale 3 L into the lungs continuous.    [provider]  pantoprazole (PROTONIX) 40 MG tablet Take 1 tablet (40 mg total) by mouth daily before breakfast. 01/10/21   Tanda Rockers, MD  pregabalin (LYRICA) 50 MG capsule Take 50 mg by mouth at bedtime. 03/13/21   [provider]  torsemide (DEMADEX) 20 MG tablet Take 1 tablet (20 mg total) by mouth 2 (two) times daily. 06/21/21   Mariel Aloe, MD  traMADol (ULTRAM) 50 MG tablet Take 50 mg by mouth daily as needed (pain).    [provider]  triamcinolone cream (KENALOG) 0.1 % Apply 1 application topically 2 (two) times daily as needed (for psoriasis).     [provider]  umeclidinium bromide (INCRUSE ELLIPTA) 62.5 MCG/INH AEPB Inhale 1 puff into the lungs daily. 03/14/21   Tanda Rockers, MD  vitamin B-12 (CYANOCOBALAMIN) 1000 MCG tablet Take 1,000 mcg by mouth daily.    [provider]    Critical care time: 70 minutes   Lestine Mount, PA-C [facility] 11/05/21 5:58 PM  Please see [email or web address] for pager details.  From 7A-7P if no response, please call [phone] After hours, please call ELink [phone]

## 2021-11-05 NOTE — ED Notes (Signed)
ED Provider at bedside. 

## 2021-11-05 NOTE — Telephone Encounter (Signed)
ATC daughter, [facility]

## 2021-11-05 NOTE — Progress Notes (Signed)
Qtc 378/482 ms. EKG placed in patients paper chart.

## 2021-11-05 NOTE — Progress Notes (Signed)
eLink Physician-Brief Progress Note Patient Name: [name] DOB: 04-Aug-1946 MRN: [id number]   Date of Service  11/05/2021  HPI/Events of Note  Patient needs home Dofetilide medication continued in the hospital, Serum lactic   eICU Interventions  Dofetilide ordered and bedside RN instructed to ensure patient gets night dose stat, Will defer volume loading patient for Lactic acid of 3.0.        [name] 11/05/2021, 9:15 PM

## 2021-11-06 ENCOUNTER — Inpatient Hospital Stay (HOSPITAL_COMMUNITY): Payer: Medicare Other

## 2021-11-06 ENCOUNTER — Other Ambulatory Visit: Payer: Self-pay

## 2021-11-06 DIAGNOSIS — A4189 Other specified sepsis: Secondary | ICD-10-CM | POA: Diagnosis not present

## 2021-11-06 DIAGNOSIS — J810 Acute pulmonary edema: Secondary | ICD-10-CM | POA: Diagnosis not present

## 2021-11-06 DIAGNOSIS — J9622 Acute and chronic respiratory failure with hypercapnia: Secondary | ICD-10-CM | POA: Diagnosis not present

## 2021-11-06 DIAGNOSIS — I5033 Acute on chronic diastolic (congestive) heart failure: Secondary | ICD-10-CM | POA: Diagnosis not present

## 2021-11-06 DIAGNOSIS — J9621 Acute and chronic respiratory failure with hypoxia: Secondary | ICD-10-CM | POA: Diagnosis not present

## 2021-11-06 DIAGNOSIS — U071 COVID-19: Secondary | ICD-10-CM | POA: Diagnosis not present

## 2021-11-06 LAB — CBC WITH DIFFERENTIAL/PLATELET
Abs Immature Granulocytes: 0.05 10*3/uL (ref 0.00–0.07)
Abs Immature Granulocytes: 0.05 10*3/uL (ref 0.00–0.07)
Basophils Absolute: 0.1 10*3/uL (ref 0.0–0.1)
Basophils Absolute: 0 10*3/uL (ref 0.0–0.1)
Basophils Relative: 1 %
Basophils Relative: 0 %
Eosinophils Absolute: 0.1 10*3/uL (ref 0.0–0.5)
Eosinophils Absolute: 0 10*3/uL (ref 0.0–0.5)
Eosinophils Relative: 1 %
Eosinophils Relative: 0 %
HCT: 22.7 % — ABNORMAL LOW (ref 36.0–46.0)
HCT: 22.3 % — ABNORMAL LOW (ref 36.0–46.0)
Hemoglobin: 6.9 g/dL — CL (ref 12.0–15.0)
Hemoglobin: 6.7 g/dL — CL (ref 12.0–15.0)
Immature Granulocytes: 1 %
Immature Granulocytes: 1 %
Lymphocytes Relative: 11 %
Lymphocytes Relative: 3 %
Lymphs Abs: 0.8 10*3/uL (ref 0.7–4.0)
Lymphs Abs: 0.2 10*3/uL — ABNORMAL LOW (ref 0.7–4.0)
MCH: 29.7 pg (ref 26.0–34.0)
MCH: 29.3 pg (ref 26.0–34.0)
MCHC: 30.4 g/dL (ref 30.0–36.0)
MCHC: 30 g/dL (ref 30.0–36.0)
MCV: 97.8 fL (ref 80.0–100.0)
MCV: 97.4 fL (ref 80.0–100.0)
Monocytes Absolute: 0.3 10*3/uL (ref 0.1–1.0)
Monocytes Absolute: 0.1 10*3/uL (ref 0.1–1.0)
Monocytes Relative: 4 %
Monocytes Relative: 2 %
Neutro Abs: 6.2 10*3/uL (ref 1.7–7.7)
Neutro Abs: 5.6 10*3/uL (ref 1.7–7.7)
Neutrophils Relative %: 82 %
Neutrophils Relative %: 94 %
Platelets: 102 10*3/uL — ABNORMAL LOW (ref 150–400)
Platelets: 91 10*3/uL — ABNORMAL LOW (ref 150–400)
RBC: 2.32 MIL/uL — ABNORMAL LOW (ref 3.87–5.11)
RBC: 2.29 MIL/uL — ABNORMAL LOW (ref 3.87–5.11)
RDW: 17.6 % — ABNORMAL HIGH (ref 11.5–15.5)
RDW: 17.3 % — ABNORMAL HIGH (ref 11.5–15.5)
WBC: 7.5 10*3/uL (ref 4.0–10.5)
WBC: 5.9 10*3/uL (ref 4.0–10.5)
nRBC: 0.7 % — ABNORMAL HIGH (ref 0.0–0.2)
nRBC: 0.5 % — ABNORMAL HIGH (ref 0.0–0.2)

## 2021-11-06 LAB — BASIC METABOLIC PANEL
Anion gap: 10 (ref 5–15)
Anion gap: 8 (ref 5–15)
BUN: 32 mg/dL — ABNORMAL HIGH (ref 8–23)
BUN: 34 mg/dL — ABNORMAL HIGH (ref 8–23)
CO2: 32 mmol/L (ref 22–32)
CO2: 34 mmol/L — ABNORMAL HIGH (ref 22–32)
Calcium: 8.6 mg/dL — ABNORMAL LOW (ref 8.9–10.3)
Calcium: 9 mg/dL (ref 8.9–10.3)
Chloride: 95 mmol/L — ABNORMAL LOW (ref 98–111)
Chloride: 96 mmol/L — ABNORMAL LOW (ref 98–111)
Creatinine, Ser: 2 mg/dL — ABNORMAL HIGH (ref 0.44–1.00)
Creatinine, Ser: 2.04 mg/dL — ABNORMAL HIGH (ref 0.44–1.00)
GFR, Estimated: 26 mL/min — ABNORMAL LOW (ref >60)
GFR, Estimated: 25 mL/min — ABNORMAL LOW (ref >60)
Glucose, Bld: 199 mg/dL — ABNORMAL HIGH (ref 70–99)
Glucose, Bld: 161 mg/dL — ABNORMAL HIGH (ref 70–99)
Potassium: 4.2 mmol/L (ref 3.5–5.1)
Potassium: 5.2 mmol/L — ABNORMAL HIGH (ref 3.5–5.1)
Sodium: 137 mmol/L (ref 135–145)
Sodium: 138 mmol/L (ref 135–145)

## 2021-11-06 LAB — BLOOD GAS, VENOUS
Acid-Base Excess: 8.8 mmol/L — ABNORMAL HIGH (ref 0.0–2.0)
Bicarbonate: 36.6 mmol/L — ABNORMAL HIGH (ref 20.0–28.0)
Drawn by: 6752
FIO2: 36
O2 Saturation: 71.9 %
Patient temperature: 36.8
pCO2, Ven: 94.8 mmHg (ref 44.0–60.0)
pH, Ven: 7.21 — ABNORMAL LOW (ref 7.250–7.430)
pO2, Ven: 43.1 mmHg (ref 32.0–45.0)

## 2021-11-06 LAB — MAGNESIUM
Magnesium: 2.1 mg/dL (ref 1.7–2.4)
Magnesium: 2.3 mg/dL (ref 1.7–2.4)

## 2021-11-06 LAB — HEMOGLOBIN AND HEMATOCRIT, BLOOD
HCT: 26.7 % — ABNORMAL LOW (ref 36.0–46.0)
Hemoglobin: 8.6 g/dL — ABNORMAL LOW (ref 12.0–15.0)

## 2021-11-06 LAB — CBC
HCT: 22.8 % — ABNORMAL LOW (ref 36.0–46.0)
Hemoglobin: 6.7 g/dL — CL (ref 12.0–15.0)
MCH: 27.9 pg (ref 26.0–34.0)
MCHC: 29.4 g/dL — ABNORMAL LOW (ref 30.0–36.0)
MCV: 95 fL (ref 80.0–100.0)
Platelets: 87 10*3/uL — ABNORMAL LOW (ref 150–400)
RBC: 2.4 MIL/uL — ABNORMAL LOW (ref 3.87–5.11)
RDW: 18.9 % — ABNORMAL HIGH (ref 11.5–15.5)
WBC: 4.5 10*3/uL (ref 4.0–10.5)
nRBC: 3.1 % — ABNORMAL HIGH (ref 0.0–0.2)

## 2021-11-06 LAB — RETICULOCYTES
Immature Retic Fract: 34.1 % — ABNORMAL HIGH (ref 2.3–15.9)
RBC.: 2.42 MIL/uL — ABNORMAL LOW (ref 3.87–5.11)
Retic Count, Absolute: 42.8 10*3/uL (ref 19.0–186.0)
Retic Ct Pct: 1.8 % (ref 0.4–3.1)

## 2021-11-06 LAB — PREPARE RBC (CROSSMATCH)

## 2021-11-06 LAB — GLUCOSE, CAPILLARY: Glucose-Capillary: 161 mg/dL — ABNORMAL HIGH (ref 70–99)

## 2021-11-06 LAB — PHOSPHORUS: Phosphorus: 4.3 mg/dL (ref 2.5–4.6)

## 2021-11-06 MED ORDER — VANCOMYCIN HCL 1500 MG/300ML IV SOLN
1500.0000 mg | Freq: Once | INTRAVENOUS | Status: AC
  Administered 2021-11-06: 1500 mg via INTRAVENOUS
  Filled 2021-11-06: qty 300

## 2021-11-06 MED ORDER — SODIUM CHLORIDE 0.9% IV SOLUTION: Freq: Once | INTRAVENOUS | Status: AC

## 2021-11-06 MED ORDER — DOFETILIDE 125 MCG PO CAPS
125.0000 ug | ORAL_CAPSULE | Freq: Two times a day (BID) | ORAL | Status: DC
  Administered 2021-11-06 – 2021-11-10 (×8): 125 ug via ORAL
  Filled 2021-11-06 (×9): qty 1

## 2021-11-06 MED ORDER — VANCOMYCIN HCL IN DEXTROSE 1-5 GM/200ML-% IV SOLN
1000.0000 mg | INTRAVENOUS | Status: DC
  Administered 2021-11-08: 1000 mg via INTRAVENOUS
  Filled 2021-11-06: qty 200

## 2021-11-06 MED ORDER — COLLAGENASE 250 UNIT/GM EX OINT
TOPICAL_OINTMENT | Freq: Every day | CUTANEOUS | Status: DC
  Filled 2021-11-06: qty 30

## 2021-11-06 MED ORDER — CALCIUM CARBONATE ANTACID 500 MG PO CHEW
2.0000 | CHEWABLE_TABLET | Freq: Every day | ORAL | Status: DC
  Administered 2021-11-06 – 2021-11-10 (×5): 400 mg via ORAL
  Filled 2021-11-06 (×5): qty 2

## 2021-11-06 MED ORDER — CHLORHEXIDINE GLUCONATE CLOTH 2 % EX PADS
6.0000 | MEDICATED_PAD | Freq: Every day | CUTANEOUS | Status: DC
  Administered 2021-11-06 – 2021-11-09 (×5): 6 via TOPICAL

## 2021-11-06 MED ORDER — FUROSEMIDE 10 MG/ML IJ SOLN
60.0000 mg | Freq: Four times a day (QID) | INTRAMUSCULAR | Status: AC
  Administered 2021-11-06 (×2): 60 mg via INTRAVENOUS
  Filled 2021-11-06 (×2): qty 6

## 2021-11-06 NOTE — Progress Notes (Signed)
RT attempted ABG x3 and could not get any flash. Pt did wear Bi-PAP all night. RT will continue to monitor as needed.

## 2021-11-06 NOTE — Progress Notes (Signed)
NAME:  [name], MRN:  [id number], DOB:  12-21-1945, LOS: 1 ADMISSION DATE:  11/05/2021, CONSULTATION DATE:  1/3 REFERRING MD:  Billy Fischer, CHIEF COMPLAINT:  Dyspnea   History of Present Illness:  76 y/o female presented to [facility] for dyspnea in the setting of dyspnea after a recent COVID 19 infection.  She required BIPAP and was felt to have a COPD exacerbation.  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 Hermina Staggers Barrett's esophagus CKD 3b Depression  Significant Hospital Events: Including procedures, antibiotic start and stop dates in addition to other pertinent events   1/3 admitted  Imaging: 08/07/21 TTE LVEF [id number], grade 2 diastolic dysfunction, left atrial size is mildly dilated, trival MR, moderate mitral regurg aortic valve not visualized  Micro: 1/3 SARS COV 2 positive, flu negative 1/3 blood >   Abx: Linezolid (home medication) planning 4 weeks 1/3 azithromycin  Interim History / Subjective:  Feels better after diuresis  Objective   Blood pressure (!) 123/54, pulse 78, temperature 98.5 F (36.9 C), temperature source Axillary, resp. rate 16, height 5' (1.524 m), weight 96.2 kg, SpO2 100 %.    FiO2 (%):  [31 %-60 %] 60 %   Intake/Output Summary (Last 24 hours) at 11/06/2021 0723 Last data filed at 11/06/2021 [id number] Gross per 24 hour  Intake 895.9 ml  Output 400 ml  Net 495.9 ml   Filed Weights   11/05/21 0848 11/05/21 1700 11/06/21 0500  Weight: 90.7 kg 94.1 kg 96.2 kg    Examination:  General:  Resting comfortably in bed HENT: NCAT OP clear PULM: Crackles bases, no wheezing B, normal effort CV: RRR, no mgr GI: BS+, soft, nontender MSK: normal bulk and tone Derm:  granulation tissue and some surrounding erythema left hip wound Neuro: awake, alert, no distress, MAEW  Left hip:    1/4 CXR > bilateral interstitial opacification, low lung volumes, heart size normal  Resolved Hospital Problem list     Assessment & Plan:  Acute on chronic hypoxemic and hypercarbic respiratory failure COPD exacerbation COVID-19 > does not have COVID pneumonia OSA CPAP Baseline ILD Baseline Trilogy ventilator use Brovana/pulmicort Prn duoneb Continue prednisone Albuterol prn Brovana/pulmicort qHS BIPAP Prednisone 40mg  daily  Lactic acidosis due to sepsis, hypoxemia Monitor lactic acid prn  HFpEF with acute exacerbation Metoprolol to continue Lasix again today  Acute on chronic symptomatic anemia, normocytic, no signs of active bleeding > linezolid effect? Thrombocytopenia > linezolid? Monitor for bleeding Transfuse PRBC for Hgb < 7 gm/dL Hold home Eliquis Continue sub q hep Stop linezolid Start vanc Check reticulocyte count  AKI on CKD IIIb > stable 1/4 Monitor BMET and UOP Replace electrolytes as needed  CAD s/p CABG tele  Atrial fibrillaiton on tykosin Dose adjust tykosin for renal function  MRSA hip infection Stop linezolid Start vanc Will notify ortho she is here Wound care consult  Psoriatic arthritis on leflunomide Rheumatoid arthritis Stop leflunomide  Hypothryoidism synthroid  Best Practice (right click and "Reselect all SmartList Selections" daily)   Diet/type: Regular consistency (see orders) DVT prophylaxis: prophylactic heparin  GI prophylaxis: N/A Lines: N/A Foley:  N/A Code Status:  limited no CPR in the event of a cardiac arrest but she is OK with short  term intubation if necessary Last date of multidisciplinary goals of care discussion [11/3 EDP]  Labs   CBC: Recent Labs  Lab 11/05/21 1024 11/05/21 1037 11/05/21 1238 11/05/21 1424  WBC 7.5  --   --  5.9  NEUTROABS 6.2  --   --  5.6  HGB 6.9* 7.8*  5.4* 6.7*  HCT 22.7* 23.0* 16.0* 22.3*  MCV 97.8  --   --  97.4  PLT 102*  --   --  91*    Basic Metabolic Panel: Recent Labs  Lab 11/05/21 1024 11/05/21 1037 11/05/21 1238 11/05/21 1709 11/05/21 2159  NA 140 138 139 141 137  K 4.1 3.9 3.7 3.8 4.2  CL 96*  --   --  96* 95*  CO2 31  --   --  31 32  GLUCOSE 145*  --   --  171* 199*  BUN 33*  --   --  30* 32*  CREATININE 1.82*  --   --  2.00* 2.00*  CALCIUM 9.2  --   --  9.1 8.6*  MG  --   --   --  2.3 2.1   GFR: Estimated Creatinine Clearance: 25.6 mL/min (A) (by C-G formula based on SCr of 2 mg/dL (H)). Recent Labs  Lab 11/05/21 1024 11/05/21 1424 11/05/21 1709 11/05/21 2159  WBC 7.5 5.9  --   --   LATICACIDVEN 1.4  --  3.0* 2.5*    Liver Function Tests: Recent Labs  Lab 11/05/21 1024  AST 18  ALT 15  ALKPHOS 63  BILITOT 0.6  PROT 6.7  ALBUMIN 3.2*   No results for input(s): LIPASE, AMYLASE in the last 168 hours. No results for input(s): AMMONIA in the last 168 hours.  ABG    Component Value Date/Time   PHART 7.287 (L) 10/20/2021 2059   PCO2ART 73.6 (HH) 10/20/2021 2059   PO2ART 92.1 10/20/2021 2059   HCO3 35.9 (H) 11/05/2021 1238   TCO2 38 (H) 11/05/2021 1238   ACIDBASEDEF 2.0 06/05/2015 0119   O2SAT 68.0 11/05/2021 1238     Coagulation Profile: No results for input(s): INR, PROTIME in the last 168 hours.  Cardiac Enzymes: No results for input(s): CKTOTAL, CKMB, CKMBINDEX, TROPONINI in the last 168 hours.  HbA1C: Hgb A1c MFr Bld  Date/Time Value Ref Range Status  08/25/2018 08:19 PM 5.3 4.8 - 5.6 % Final    Comment:    (NOTE) Pre diabetes:          5.7%-6.4% Diabetes:              >6.4% Glycemic control for   <7.0% adults with diabetes   05/31/2015 09:29 PM 5.8 (H) 4.8 - 5.6 % Final    Comment:    (NOTE)         Pre-diabetes: 5.7 - 6.4         Diabetes: >6.4         Glycemic control for adults with diabetes: <7.0     CBG: Recent Labs  Lab 11/05/21 1704  GLUCAP 155*     Critical care time: 45 minutes    Roselie Awkward, MD [place] [facility] Pager: [phone] Cell: [phone] After 7:00 pm call [place]  [phone]

## 2021-11-06 NOTE — Consult Note (Signed)
WOC Nurse Consult Note: Reason for Consult: left hip wound; s/p irrigation and debridement 10/10/21 per Dr. Despina Hick Wound type: surgical  Pressure Injury POA: NA Measurement:well approximated except for proximal portion that is 3cm x 0.5cm   Wound bed:100% yellow/black soft slough: probed with sterile applicator; does not tunnel  Drainage (amount, consistency, odor) scant  Periwound: intact  Dressing procedure/placement/frequency: Enzymatic debridement ointment to the open wound daily, once cleared of necrotic tissue can be covered with dry dressing.   Discussed POC with patient and bedside nurse.  Re consult if needed, will not follow at this time. Thanks  Avira Tillison [employer], RN,CWOCN, CNS, CWON-AP [phone])

## 2021-11-06 NOTE — Progress Notes (Signed)
Pharmacy Antibiotic Note  [name] is a 76 y.o. female admitted on 11/05/2021 with  hypercapnic respiratory failure due to COVID 19 and a periprosthetic infection in the setting of left hip replacement  . Previously started on Linezolid for +MRSA deep wound culture- discontinued due to anemia (hgb 6.7) and thrombocytopenia (plt 87).  Pharmacy has been consulted for Vancomycin dosing. WBC is WNL (5.9) Currently afebrile.   Plan: Vancomycin LD 1500 mg IV once followed by MD 1000 mg IV q48hr (eAUC 543)  Monitor signs and symptoms of worsening wound infection (temp, WBC) Monitor  for acute changes renal function to guide dose adjustment  Obtain Vancomycin levels after 3rd dose, before 4th  Height: 5' (152.4 cm) Weight: 96.2 kg (212 lb 1.3 oz) IBW/kg (Calculated) : 45.5  Temp (24hrs), Avg:98.7 F (37.1 C), Min:98.2 F (36.8 C), Max:99 F (37.2 C)  Recent Labs  Lab 11/05/21 1024 11/05/21 1424 11/05/21 1709 11/05/21 2159 11/06/21 0649  WBC 7.5 5.9  --   --  4.5  CREATININE 1.82*  --  2.00* 2.00* 2.04*  LATICACIDVEN 1.4  --  3.0* 2.5*  --     Estimated Creatinine Clearance: 25.1 mL/min (A) (by C-G formula based on SCr of 2.04 mg/dL (H)).    Allergies  Allergen Reactions   Gabapentin Other (See Comments)    Dizziness, lighthead    Antimicrobials this admission: Azithromycin 500 mg x1 1/3 Linezolid 600 mg IV BID 12/14 >> 1/4  Microbiology results: 12/08 Synovial Hip: MRSA (R: Fluoroquinolone, Tetracycline, Clindamycin) 12/18 MRSA PCR: Detected 12/18 Resp Panel: COVID 19 + 1/3 BCx: NGTD 1/3 MRSA PCR: Not detected 1/3 Resp Panel: COVID 19 +  Thank you for allowing pharmacy to be a part of this patients care.  Asher Muir, PharmD Candidate 11/06/2021 10:48 AM

## 2021-11-06 NOTE — Progress Notes (Signed)
EKG in paper chart. Qtc 432/486 2hrs post Tikosyn administration

## 2021-11-06 NOTE — Progress Notes (Signed)
Patients K+ came back 4.2. Dofetilide given. Patient also given supplemental K+ and administered lasix prior to [facility] replacement. Will transfuse patient once blood is received from blood bank. Will recheck QtC 2 hrs post Dofetilide administration.

## 2021-11-06 NOTE — Progress Notes (Signed)
Subjective: [name] was admitted yesterday for pulmonary failure. She states she is breathing better today. She has no specific complaints with regards to the hip. Her daughter has been doing daily dressing changes and reports minimal drainage.  Objective: Vital signs in last 24 hours: Temp:  [98.2 F (36.8 C)-99 F (37.2 C)] 98.6 F (37 C) (01/04 1339) Pulse Rate:  [73-108] 80 (01/04 1500) Resp:  [16-25] 24 (01/04 1500) BP: (102-168)/(54-145) 141/64 (01/04 1500) SpO2:  [95 %-100 %] 99 % (01/04 1500) FiO2 (%):  [50 %-60 %] 50 % (01/04 0945) Weight:  [96.2 kg] 96.2 kg (01/04 0500)  Intake/Output from previous day: 01/03 0701 - 01/04 0700 In: 895.9 [P.O.:237; I.V.:31.8; Blood:312.5; IV Piggyback:314.7] Out: 400 [Urine:400] Intake/Output this shift: Total I/O In: 588.8 [P.O.:300; Blood:288.8] Out: 350 [Urine:350]  Recent Labs    11/05/21 1037 11/05/21 1238 11/05/21 1424 11/06/21 0649 11/06/21 1536  HGB 7.8* 5.4* 6.7* 6.7* 8.6*   Recent Labs    11/05/21 1424 11/06/21 0649 11/06/21 1536  WBC 5.9 4.5  --   RBC 2.29* 2.40*   2.42*  --   HCT 22.3* 22.8* 26.7*  PLT 91* 87*  --    Recent Labs    11/05/21 2159 11/06/21 0649  NA 137 138  K 4.2 5.2*  CL 95* 96*  CO2 32 34*  BUN 32* 34*  CREATININE 2.00* 2.04*  GLUCOSE 199* 161*  CALCIUM 8.6* 9.0   No results for input(s): LABPT, INR in the last 72 hours.  No cellulitis present Compartment soft No warmth or swelling around hip incision. Fully closed except for superficial excoriation at proximal portion of wound. This is where the superficial drainage is coming from   Assessment/Plan: Left hip subcutaneous infected seroma- The wound looks better overall. Only area off concern is the excoriated area proximally. Agree with wound care team about doing enzymatic ointment daily to debride this superficially. No need for surgical intervention   Gaynelle Arabian 11/06/2021, 5:23 PM

## 2021-11-07 ENCOUNTER — Inpatient Hospital Stay (HOSPITAL_COMMUNITY): Payer: Medicare Other

## 2021-11-07 DIAGNOSIS — J9622 Acute and chronic respiratory failure with hypercapnia: Secondary | ICD-10-CM | POA: Diagnosis not present

## 2021-11-07 DIAGNOSIS — J9621 Acute and chronic respiratory failure with hypoxia: Secondary | ICD-10-CM | POA: Diagnosis not present

## 2021-11-07 DIAGNOSIS — J9602 Acute respiratory failure with hypercapnia: Secondary | ICD-10-CM

## 2021-11-07 LAB — COMPREHENSIVE METABOLIC PANEL
ALT: 15 U/L (ref 0–44)
AST: 14 U/L — ABNORMAL LOW (ref 15–41)
Albumin: 3.1 g/dL — ABNORMAL LOW (ref 3.5–5.0)
Alkaline Phosphatase: 66 U/L (ref 38–126)
Anion gap: 8 (ref 5–15)
BUN: 46 mg/dL — ABNORMAL HIGH (ref 8–23)
CO2: 33 mmol/L — ABNORMAL HIGH (ref 22–32)
Calcium: 9.1 mg/dL (ref 8.9–10.3)
Chloride: 95 mmol/L — ABNORMAL LOW (ref 98–111)
Creatinine, Ser: 2.14 mg/dL — ABNORMAL HIGH (ref 0.44–1.00)
GFR, Estimated: 24 mL/min — ABNORMAL LOW (ref >60)
Glucose, Bld: 135 mg/dL — ABNORMAL HIGH (ref 70–99)
Potassium: 5 mmol/L (ref 3.5–5.1)
Sodium: 136 mmol/L (ref 135–145)
Total Bilirubin: 0.5 mg/dL (ref 0.3–1.2)
Total Protein: 6.3 g/dL — ABNORMAL LOW (ref 6.5–8.1)

## 2021-11-07 LAB — BPAM RBC
Blood Product Expiration Date: 202301252359
Blood Product Expiration Date: 202301252359
ISSUE DATE / TIME: 202301040033
ISSUE DATE / TIME: 202301040939
Unit Type and Rh: 5100
Unit Type and Rh: 5100

## 2021-11-07 LAB — CBC WITH DIFFERENTIAL/PLATELET
Abs Immature Granulocytes: 0.05 10*3/uL (ref 0.00–0.07)
Basophils Absolute: 0 10*3/uL (ref 0.0–0.1)
Basophils Relative: 0 %
Eosinophils Absolute: 0 10*3/uL (ref 0.0–0.5)
Eosinophils Relative: 0 %
HCT: 29.3 % — ABNORMAL LOW (ref 36.0–46.0)
Hemoglobin: 9.3 g/dL — ABNORMAL LOW (ref 12.0–15.0)
Immature Granulocytes: 1 %
Lymphocytes Relative: 7 %
Lymphs Abs: 0.5 10*3/uL — ABNORMAL LOW (ref 0.7–4.0)
MCH: 29.5 pg (ref 26.0–34.0)
MCHC: 31.7 g/dL (ref 30.0–36.0)
MCV: 93 fL (ref 80.0–100.0)
Monocytes Absolute: 0.6 10*3/uL (ref 0.1–1.0)
Monocytes Relative: 8 %
Neutro Abs: 6.7 10*3/uL (ref 1.7–7.7)
Neutrophils Relative %: 84 %
Platelets: 77 10*3/uL — ABNORMAL LOW (ref 150–400)
RBC: 3.15 MIL/uL — ABNORMAL LOW (ref 3.87–5.11)
RDW: 19.4 % — ABNORMAL HIGH (ref 11.5–15.5)
WBC: 7.9 10*3/uL (ref 4.0–10.5)
nRBC: 3.7 % — ABNORMAL HIGH (ref 0.0–0.2)

## 2021-11-07 LAB — TYPE AND SCREEN
ABO/RH(D): O POS
Antibody Screen: NEGATIVE
Unit division: 0
Unit division: 0

## 2021-11-07 MED ORDER — TRAMADOL HCL 50 MG PO TABS: 50.0000 mg | ORAL_TABLET | Freq: Every day | ORAL | Status: DC | PRN

## 2021-11-07 MED ORDER — FUROSEMIDE 10 MG/ML IJ SOLN
60.0000 mg | Freq: Four times a day (QID) | INTRAMUSCULAR | Status: AC
  Administered 2021-11-07 (×2): 60 mg via INTRAVENOUS
  Filled 2021-11-07 (×2): qty 6

## 2021-11-07 MED ORDER — ACETAMINOPHEN 325 MG PO TABS: 650.0000 mg | ORAL_TABLET | Freq: Four times a day (QID) | ORAL | Status: DC | PRN

## 2021-11-07 NOTE — Progress Notes (Signed)
NAME:  [name], MRN:  [id number], DOB:  22-Apr-1946, LOS: 2 ADMISSION DATE:  11/05/2021, CONSULTATION DATE:  1/3 REFERRING MD:  Dalene Seltzer, CHIEF COMPLAINT:  Dyspnea   History of Present Illness:  76 y/o female presented to [facility] for dyspnea in the setting of dyspnea after a recent COVID 19 infection.  She required BIPAP and was felt to have a COPD exacerbation.  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 Melida Quitter Barrett's esophagus CKD 3b Depression  Significant Hospital Events: Including procedures, antibiotic start and stop dates in addition to other pertinent events   1/3 admitted 1/5 Able to take a break from BIPAP but wanted to rest on it this morning   Imaging 08/07/21 TTE LVEF 65-70%, grade 2 diastolic dysfunction, left atrial size is mildly dilated, trival MR, moderate mitral regurg aortic valve not visualized  Micro: 1/3 SARS COV 2 positive, flu negative 1/3 blood >   Abx: Linezolid (home medication) planning 4 weeks 1/3 azithromycin  Interim History / Subjective:  No issues overnight Continues to remain hypercapnic per baseline   Objective   Blood pressure 104/63, pulse 66, temperature 98.2 F (36.8 C), temperature source Axillary, resp. rate 20, height 5' (1.524 m), weight 95.7 kg, SpO2 100 %.    Vent Mode: BIPAP FiO2 (%):  [36 %-50 %] 50 % Set Rate:  [20 bmp] 20 bmp PEEP:  [8 cmH20] 8 cmH20   Intake/Output Summary (Last 24 hours) at 11/07/2021 0721 Last data filed at 11/07/2021 0600 Gross per 24 hour  Intake 869.13 ml  Output 1450 ml  Net -580.87 ml   Filed Weights   11/05/21 1700 11/06/21 0500 11/07/21 0500  Weight: 94.1 kg 96.2 kg 95.7 kg    Examination: General:  Acute on chronically ill appearing elderly female on BIPAP, in NAD HEENT: [place]/AT, MM pink/moist, PERRL,  Neuro: Will open eyes to verbal stimuli and answer simple commands, non-focal  CV: s1s2 regular rate and rhythm, no murmur, rubs, or gallops,  PULM:  Diminished bilaterally, BIPAP breath sounds, no increased work of breathing  GI: soft, bowel sounds active in all 4 quadrants, non-tender, non-distended, tolerating oral diet Extremities: warm/dry, no edema  Skin: no rashes or lesions Left hip:    Resolved Hospital Problem list     Assessment & Plan:  Acute on chronic hypoxemic and hypercarbic respiratory failure COPD exacerbation COVID-19 > does not have COVID pneumonia OSA CPAP Baseline ILD Baseline Trilogy ventilator use P: Continue supplemental oxygen for sat goal > 88 Head of bed elevated 30 degrees Ensure adequate pulmonary hygiene  Follow cultures  Continue Brovana/Pulmicort  PRN Duonebs  Remains on PO prednisone  QHS and PRN BIPAP   HFpEF with acute exacerbation -ECHO 08/07/2021 EF 65-70% with no LV wall motion abnormality, grade II diastolic dysfunction  CAD s/p CABG Atrial fibrillaiton  -on tykosin and anticoagulated on Eliquis at baseline P: Continuous telemetry  Monitor BNP Heart health diet with sodium restriction when able  Strict intake and output  Daily weight to assess volume status Daily assessment for need to diurese with the use of IV lasix  Closely monitor renal function and electrolytes  BIPAP as needed and PRN  Ensure hemodynamic control Continue beta blocker  Eliquis remains on hold   Acute on chronic symptomatic anemia, normocytic Thrombocytopenia -no signs of active bleeding > linezolid effect? P: Continue to monitor for signs of bleeding  Transfuse per protocol  Hgb goal >7 Transfuse Plt <10 Eliquis on hold as above  Stop Linezolid, start Vanc   AKI on CKD IIIb  -Creatinine 1.34 12/22 with uptrend to 2.14 1/5 Hyperkalemia   P: Follow renal function  Monitor urine output Trend Bmet Avoid nephrotoxins Ensure adequate renal perfusion   MRSA hip infection -Underwent I&D 12/8 with wound vac placed, vac later removed 12/20 -Patient was discharged on Linezolid  P: Ortho following  Vanc started and Linezolid stopped  Local wound care   Psoriatic arthritis on leflunomide Rheumatoid arthritis P: Stop Lefulomide   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 Harris  Total critical care time: 38 minutes  Critical care time was exclusive of separately billable procedures and treating other patients.  Critical care was necessary to treat or prevent imminent or life-threatening deterioration.  Critical care was time spent personally by me on the following activities: development of treatment plan with patient and/or surrogate as well as nursing, discussions with consultants, evaluation of patient's response to treatment, examination of patient, obtaining history from patient or surrogate, ordering and performing treatments and interventions, ordering and review of laboratory studies, ordering and review of radiographic studies, pulse oximetry and re-evaluation of patient's condition.  Eymi Lipuma D. Kenton Kingfisher, NP-C [facility] Personal contact information can be found on Amion  11/07/2021, 11:16 AM

## 2021-11-07 NOTE — Plan of Care (Signed)

## 2021-11-07 NOTE — TOC Initial Note (Signed)
Transition of Care [facility]) - Initial/Assessment Note    Patient Details  Name: [name] MRN: [id number] Date of Birth: 30-Oct-1946  Transition of Care [facility]) CM/SW Contact:    Tom-Johnson, Renea Ee, RN Phone Number: 11/07/2021, 12:21 PM  Clinical Narrative:                  CM spoke with patient's daughter, [name] about needs for post hospital transition. Patient is currently on BIPAP and unable to answer questions. [name] states patient lives with her and patient has been going in and out of the hospital for the past month. CM explained to [place] about the possibility of transferring patient to [facility] to give patient more time to recover. [name] agrees for patient to go to [facility] to recover and prevent her from coming back to the hospital. States she will talk with patient about it. Jennifer with [facility] was also in the room and explained the services the offer. MD is aware. Patient is not medically ready at this time. CM will continue to follow with needs.    Barriers to Discharge: Continued Medical Work up   Patient Goals and CMS Choice Patient states their goals for this hospitalization and ongoing recovery are:: To return home after recovery. CMS Medicare.gov Compare Post Acute Care list provided to:: Patient Choice offered to / list presented to : Patient, Adult Children (Daughter, [name].)  Expected Discharge Plan and Services     Discharge Planning Services: CM Consult Post Acute Care Choice: Long Term Acute Care (LTAC) ([facility]) Living arrangements for the past 2 months: Single Family Home                                      Prior Living Arrangements/Services Living arrangements for the past 2 months: Single Family Home Lives with:: Adult Children (Daughter, [name]) Patient language and need for interpreter reviewed:: Yes Do you feel safe going back to the place where you live?: Yes      Need for Family Participation in Patient Care: Yes  (Comment) Care giver support system in place?: Yes (comment) Current home services: DME Criminal Activity/Legal Involvement Pertinent to Current Situation/Hospitalization: No - Comment as needed  Activities of Daily Living      Permission Sought/Granted Permission sought to share information with : Case Manager, Customer service manager, Family Supports Permission granted to share information with : Yes, Verbal Permission Granted              Emotional Assessment Appearance:: Appears stated age Attitude/Demeanor/Rapport: Engaged, Gracious Affect (typically observed): Accepting, Appropriate, Calm, Hopeful Orientation: : Oriented to Self, Oriented to Place, Oriented to Situation Alcohol / Substance Use: Not Applicable Psych Involvement: No (comment)  Admission diagnosis:  Respiratory failure ([place]) [J96.90] Acute hypercapnic respiratory failure ([place]) [J96.02] Patient Active Problem List   Diagnosis Date Noted   Respiratory failure ([place]) 11/05/2021   Acute hypercapnic respiratory failure ([place]) 11/05/2021   Normochromic anemia 10/24/2021   Hyperkalemia 10/23/2021   Acute on chronic diastolic CHF (congestive heart failure) ([place]) 10/23/2021   Acute renal failure superimposed on stage 3b chronic kidney disease ([place]) 10/20/2021   CHF exacerbation ([place]) 10/20/2021   Hematoma of left hip 10/10/2021   Periprosthetic fracture with infection around internal prosthetic left hip joint ([place]) 10/06/2021   CAD in native artery    ILD (interstitial lung disease) ([place])    Periprosthetic fracture of femur following total replacement  of hip    Fluid collection at surgical site    OA (osteoarthritis) of hip 09/16/2021   Primary osteoarthritis of left hip 09/16/2021   Acute on chronic respiratory failure with hypoxia and hypercapnia ([place]) 06/18/2021   Leukocytosis 06/18/2021   DNR (do not resuscitate) 06/18/2021   Persistent atrial fibrillation ([place]) 02/05/2021   Chronic heart failure  with preserved ejection fraction ([place]) 01/07/2021   Stage 3b chronic kidney disease ([place]) 01/07/2021   Atrial fibrillation ([place]) 01/03/2021   Sciatica 12/27/2020   Diarrhea 12/27/2020   Atrial flutter ([place]) 12/25/2020   Secondary hypercoagulable state ([place]) 12/25/2020   Chronic respiratory failure with hypoxia and hypercapnia ([place]) 04/20/2019   Severe episode of recurrent major depressive disorder, without psychotic features ([place]) 11/09/2018   COPD (chronic obstructive pulmonary disease) ([place]) 11/02/2018   COPD with acute exacerbation ([place]) 11/01/2018   CAP (community acquired pneumonia) 10/28/2018   ICH (intracerebral hemorrhage) ([place]) 08/25/2018   Hyperlipidemia 07/12/2015   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   Morbid (severe) obesity due to excess calories ([place]) 04/22/2015   Postoperative anemia due to acute blood loss 06/08/2013   History of home oxygen therapy 06/07/2013   GERD (gastroesophageal reflux disease) 06/07/2013   Barrett's esophagus 06/07/2013   OA (osteoarthritis) of knee 06/06/2013   COPD III spirometry if use FEV1/VC p saba  07/18/2010   PULMONARY FIBROSIS ILD POST INFLAMMATORY CHRONIC 07/18/2010   Compression fracture of thoracic vertebra ([place]) 07/02/2010   Hypothyroidism 04/20/2007   Essential hypertension 04/20/2007   PSORIATIC ARTHRITIS 04/20/2007   PCP:  Erline Hau, MD Pharmacy:   [id number] [address], [address] [place] [address] Phone: [phone] Fax: [phone]     Social Determinants of Health (SDOH) Interventions    Readmission Risk Interventions Readmission Risk Prevention Plan 10/23/2021 06/21/2021  Transportation Screening Complete Complete  PCP or Specialist Appt within 5-7 Days - Complete  Home Care Screening - Complete  Medication Review (RN CM) - Complete  Medication Review (RN Care Manager) Complete -  PCP or Specialist appointment within 3-5 days of discharge Complete -  [place]  or Home Care Consult Complete -  SW Recovery Care/Counseling Consult Complete -  Palliative Care Screening Not Applicable -  [place] Not Applicable -  Some recent data might be hidden

## 2021-11-08 DIAGNOSIS — J9622 Acute and chronic respiratory failure with hypercapnia: Secondary | ICD-10-CM | POA: Diagnosis not present

## 2021-11-08 DIAGNOSIS — J9621 Acute and chronic respiratory failure with hypoxia: Secondary | ICD-10-CM | POA: Diagnosis not present

## 2021-11-08 LAB — GLUCOSE, CAPILLARY
Glucose-Capillary: 124 mg/dL — ABNORMAL HIGH (ref 70–99)
Glucose-Capillary: 151 mg/dL — ABNORMAL HIGH (ref 70–99)

## 2021-11-08 LAB — FUNGUS CULTURE WITH STAIN

## 2021-11-08 LAB — FUNGAL ORGANISM REFLEX

## 2021-11-08 LAB — FUNGUS CULTURE RESULT

## 2021-11-08 MED ORDER — PREDNISONE 20 MG PO TABS
40.0000 mg | ORAL_TABLET | Freq: Every day | ORAL | Status: DC
  Administered 2021-11-09: 40 mg via ORAL
  Filled 2021-11-08: qty 2

## 2021-11-08 MED ORDER — CARVEDILOL 6.25 MG PO TABS
6.2500 mg | ORAL_TABLET | Freq: Two times a day (BID) | ORAL | Status: DC
  Administered 2021-11-08 – 2021-11-09 (×3): 6.25 mg via ORAL
  Filled 2021-11-08 (×3): qty 1

## 2021-11-08 MED ORDER — FUROSEMIDE 10 MG/ML IJ SOLN
60.0000 mg | Freq: Four times a day (QID) | INTRAMUSCULAR | Status: AC
  Administered 2021-11-08 (×2): 60 mg via INTRAVENOUS
  Filled 2021-11-08 (×2): qty 6

## 2021-11-08 NOTE — Progress Notes (Addendum)
NAME:  [name], MRN:  [id number], DOB:  [birth year], LOS: 3 ADMISSION DATE:  11/05/2021, CONSULTATION DATE:  1/3 REFERRING MD:  Billy Fischer, CHIEF COMPLAINT:  Dyspnea   History of Present Illness:  76 y/o female presented to [facility] for dyspnea in the setting of dyspnea after a recent COVID 19 infection.  She required BIPAP and was felt to have a COPD exacerbation.  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 Hermina Staggers Barrett's esophagus CKD 3b Depression  Significant Hospital Events: Including procedures, antibiotic start and stop dates in addition to other pertinent events   1/3 admitted  Imaging: 08/07/21 TTE LVEF [id number], grade 2 diastolic dysfunction, left atrial size is mildly dilated, trival MR, moderate mitral regurg aortic valve not visualized  Micro: 1/3 SARS COV 2 positive, flu negative 1/3 blood >   Abx: Linezolid (home medication) planning 4 weeks 1/3 azithromycin  Interim History / Subjective:  Still intermittently on NIV Good diuresis Slowly improving  Objective   Blood pressure (!) 157/93, pulse 76, temperature 99 F (37.2 C), temperature source Oral, resp. rate (!) 29, height 5' (1.524 m), weight 95.7 kg, SpO2 96 %.    Vent Mode: BIPAP FiO2 (%):  [50 %] 50 % Set Rate:  [20 bmp] 20 bmp PEEP:  [8 cmH20] 8 cmH20   Intake/Output Summary (Last 24 hours) at 11/08/2021 0946 Last data filed at 11/08/2021 0600 Gross per 24 hour  Intake 150 ml  Output 2350 ml  Net -2200 ml    Filed Weights   11/05/21 1700 11/06/21 0500 11/07/21 0500  Weight: 94.1 kg 96.2 kg 95.7 kg    Examination:  Resolved Hospital Problem list     Assessment & Plan:  Acute on chronic hypoxemic and  hypercarbic respiratory failure COPD exacerbation COVID-19 > does not have COVID pneumonia OSA CPAP Baseline ILD Baseline Trilogy ventilator use Brovana/pulmicort Prn duoneb Continue prednisone Albuterol prn Brovana/pulmicort qHS BIPAP and PRN during day Prednisone 40mg  daily  Lactic acidosis due to sepsis, hypoxemia Monitor lactic acid prn  HFpEF with acute exacerbation Metoprolol to continue Lasix again today (1/6)  Acute on chronic anemia- unclear what precipitated, Hgb better today; question linezolid induced vs. Phlebotomy vs. Bone marrow stunning from COVID - Continue to hold eliquis today - Monitor CBC  AKI on CKD IIIb > stable 1/4 Monitor BMET and UOP Replace electrolytes as needed  CAD s/p CABG tele  Atrial fibrilation on tykosin Dose adjust tykosin for renal function  Left hip subQ infected seroma - Ortho seen, no need for acute intervention - WOC recs appreciated: enzymatic ointment daily - Prolonged vanc course (due to thrombocytopenia)  Psoriatic arthritis on leflunomide Rheumatoid arthritis Stop leflunomide  Hypothryoidism synthroid  Best Practice (right click and "Reselect all SmartList Selections" daily)   Diet/type: Regular consistency (see orders) DVT prophylaxis: prophylactic heparin  GI prophylaxis: N/A Lines: N/A Foley:  N/A Code Status:  limited no CPR in the event of a cardiac arrest but she is OK with short term intubation if necessary Last date of multidisciplinary goals of care discussion [11/3 EDP]  Keep in ICU one more day due to multiorgan failure and tenuous respiratory status  Spoke with [facility] and they have accepted patient for ongoing  care.  Will watch one more day here then likely send over weekend.   Patient critically ill due to respiratory failure Interventions to address this today intubation watch, bipap titration Risk of deterioration without these interventions is high  I personally spent 31 minutes providing  critical care not including any separately billable procedures  Erskine Emery MD [place] [facility]  Prefer epic messenger for cross cover needs If after hours, please call E-link

## 2021-11-08 NOTE — Plan of Care (Signed)
°  Problem: Clinical Measurements: Goal: Respiratory complications will improve Outcome: Progressing   Problem: Nutrition: Goal: Adequate nutrition will be maintained Outcome: Progressing Note: Pt is tolerating meals well, eating 50-100% each meal.   Problem: Elimination: Goal: Will not experience complications related to bowel motility Outcome: Progressing Goal: Will not experience complications related to urinary retention Outcome: Progressing   Problem: Pain Managment: Goal: General experience of comfort will improve Outcome: Progressing   Problem: Safety: Goal: Ability to remain free from injury will improve Outcome: Progressing   Problem: Activity: Goal: Risk for activity intolerance will decrease Outcome: Not Progressing Note: Pt has not been out of bed since admission. Has had recent hip surgery. RN will discuss PT/OT consult if not already ordered.

## 2021-11-08 NOTE — TOC Progression Note (Signed)
Transition of Care [facility]) - Progression Note    Patient Details  Name: [name] MRN: [id number] Date of Birth: Jun 04, 1946  Transition of Care [facility]) CM/SW Contact  Tom-Johnson, Renea Ee, RN Phone Number: 11/08/2021, 4:03 PM  Clinical Narrative:    CM was notified by RN that patient declines [facility] and would like to go home. CM called and spoke with daughter [name] and notified her of patient's request. [name] states she does not want patient to go home at this time as she keeps returning back to the hospital. [name] states she wants patient be medically stable before going home with home health as she is currently active with [employer]. CM was called to patient's room by [facility] and patient and both verbalized agreement to go to [facility]. Anderson Malta with [facility] notified as well as MD. CM will continue to follow with needs.     Barriers to Discharge: Continued Medical Work up  Expected Discharge Plan and Services     Discharge Planning Services: CM Consult Post Acute Care Choice: Long Term Acute Care (LTAC) ([facility]) Living arrangements for the past 2 months: Single Family Home                                       Social Determinants of Health (SDOH) Interventions    Readmission Risk Interventions Readmission Risk Prevention Plan 10/23/2021 06/21/2021  Transportation Screening Complete Complete  PCP or Specialist Appt within 5-7 Days - Complete  Home Care Screening - Complete  Medication Review (RN CM) - Complete  Medication Review Press photographer) Complete -  PCP or Specialist appointment within 3-5 days of discharge Complete -  [place] or Home Care Consult Complete -  SW Recovery Care/Counseling Consult Complete -  Palliative Care Screening Not Applicable -  [place] Not Applicable -  Some recent data might be hidden

## 2021-11-09 DIAGNOSIS — J9622 Acute and chronic respiratory failure with hypercapnia: Secondary | ICD-10-CM

## 2021-11-09 DIAGNOSIS — D649 Anemia, unspecified: Secondary | ICD-10-CM | POA: Diagnosis not present

## 2021-11-09 DIAGNOSIS — D696 Thrombocytopenia, unspecified: Secondary | ICD-10-CM

## 2021-11-09 DIAGNOSIS — L8990 Pressure ulcer of unspecified site, unspecified stage: Secondary | ICD-10-CM | POA: Insufficient documentation

## 2021-11-09 DIAGNOSIS — J9621 Acute and chronic respiratory failure with hypoxia: Secondary | ICD-10-CM

## 2021-11-09 LAB — BASIC METABOLIC PANEL
Anion gap: 5 (ref 5–15)
BUN: 52 mg/dL — ABNORMAL HIGH (ref 8–23)
CO2: 44 mmol/L — ABNORMAL HIGH (ref 22–32)
Calcium: 9 mg/dL (ref 8.9–10.3)
Chloride: 92 mmol/L — ABNORMAL LOW (ref 98–111)
Creatinine, Ser: 1.47 mg/dL — ABNORMAL HIGH (ref 0.44–1.00)
GFR, Estimated: 37 mL/min — ABNORMAL LOW (ref >60)
Glucose, Bld: 117 mg/dL — ABNORMAL HIGH (ref 70–99)
Potassium: 4.5 mmol/L (ref 3.5–5.1)
Sodium: 141 mmol/L (ref 135–145)

## 2021-11-09 LAB — DIC (DISSEMINATED INTRAVASCULAR COAGULATION)PANEL
D-Dimer, Quant: 1.05 ug/mL-FEU — ABNORMAL HIGH (ref 0.00–0.50)
Fibrinogen: 418 mg/dL (ref 210–475)
INR: 1 (ref 0.8–1.2)
Platelets: 47 10*3/uL — ABNORMAL LOW (ref 150–400)
Prothrombin Time: 12.7 seconds (ref 11.4–15.2)
Smear Review: NONE SEEN
aPTT: 29 seconds (ref 24–36)

## 2021-11-09 LAB — BLOOD GAS, VENOUS
Acid-Base Excess: 17.1 mmol/L — ABNORMAL HIGH (ref 0.0–2.0)
Bicarbonate: 45.2 mmol/L — ABNORMAL HIGH (ref 20.0–28.0)
Drawn by: 164
FIO2: 28
O2 Saturation: 68 %
Patient temperature: 37
pCO2, Ven: 113 mmHg (ref 44.0–60.0)
pH, Ven: 7.227 — ABNORMAL LOW (ref 7.250–7.430)
pO2, Ven: 40.6 mmHg (ref 32.0–45.0)

## 2021-11-09 LAB — CBC
HCT: 26 % — ABNORMAL LOW (ref 36.0–46.0)
Hemoglobin: 7.8 g/dL — ABNORMAL LOW (ref 12.0–15.0)
MCH: 28.4 pg (ref 26.0–34.0)
MCHC: 30 g/dL (ref 30.0–36.0)
MCV: 94.5 fL (ref 80.0–100.0)
Platelets: 45 10*3/uL — ABNORMAL LOW (ref 150–400)
RBC: 2.75 MIL/uL — ABNORMAL LOW (ref 3.87–5.11)
RDW: 17.4 % — ABNORMAL HIGH (ref 11.5–15.5)
WBC: 6.3 10*3/uL (ref 4.0–10.5)
nRBC: 1.1 % — ABNORMAL HIGH (ref 0.0–0.2)

## 2021-11-09 LAB — RETICULOCYTES
Immature Retic Fract: 13 % (ref 2.3–15.9)
RBC.: 2.81 MIL/uL — ABNORMAL LOW (ref 3.87–5.11)
Retic Count, Absolute: 43.6 10*3/uL (ref 19.0–186.0)
Retic Ct Pct: 1.6 % (ref 0.4–3.1)

## 2021-11-09 LAB — MAGNESIUM: Magnesium: 2.4 mg/dL (ref 1.7–2.4)

## 2021-11-09 LAB — GLUCOSE, CAPILLARY: Glucose-Capillary: 151 mg/dL — ABNORMAL HIGH (ref 70–99)

## 2021-11-09 LAB — PHOSPHORUS: Phosphorus: 3.2 mg/dL (ref 2.5–4.6)

## 2021-11-09 LAB — LACTATE DEHYDROGENASE: LDH: 199 U/L — ABNORMAL HIGH (ref 98–192)

## 2021-11-09 MED ORDER — CARVEDILOL 12.5 MG PO TABS
12.5000 mg | ORAL_TABLET | Freq: Two times a day (BID) | ORAL | Status: DC
  Administered 2021-11-09 – 2021-11-10 (×2): 12.5 mg via ORAL
  Filled 2021-11-09 (×2): qty 1

## 2021-11-09 MED ORDER — PREDNISONE 20 MG PO TABS
30.0000 mg | ORAL_TABLET | Freq: Every day | ORAL | Status: DC
  Administered 2021-11-10: 30 mg via ORAL
  Filled 2021-11-09: qty 1

## 2021-11-09 MED ORDER — ALBUTEROL SULFATE HFA 108 (90 BASE) MCG/ACT IN AERS
2.0000 | INHALATION_SPRAY | RESPIRATORY_TRACT | Status: DC | PRN
  Administered 2021-11-09 – 2021-11-10 (×2): 2 via RESPIRATORY_TRACT
  Filled 2021-11-09: qty 6.7

## 2021-11-09 MED ORDER — FUROSEMIDE 10 MG/ML IJ SOLN
60.0000 mg | Freq: Two times a day (BID) | INTRAMUSCULAR | Status: AC
  Administered 2021-11-09 (×2): 60 mg via INTRAVENOUS
  Filled 2021-11-09 (×2): qty 6

## 2021-11-09 MED ORDER — BUDESONIDE 0.25 MG/2ML IN SUSP
0.5000 mg | Freq: Two times a day (BID) | RESPIRATORY_TRACT | Status: DC
  Administered 2021-11-09 – 2021-11-10 (×2): 0.5 mg via RESPIRATORY_TRACT
  Filled 2021-11-09 (×2): qty 4

## 2021-11-09 NOTE — Progress Notes (Signed)
NAME:  [name], MRN:  [id number], DOB:  1946-06-07, LOS: 4 ADMISSION DATE:  11/05/2021, CONSULTATION DATE:  1/3 REFERRING MD:  Billy Fischer, CHIEF COMPLAINT:  Dyspnea   History of Present Illness:  76 y/o female presented to [facility] for dyspnea in the setting of dyspnea after a recent COVID 19 infection.  She required BIPAP and was felt to have a COPD exacerbation.  Admitted to the ICU.  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 Hermina Staggers Barrett's esophagus CKD 3b Depression  Significant Hospital Events: Including procedures, antibiotic start and stop dates in addition to other pertinent events   1/3 admitted, transfused 1 unit PRBC 1/4 transfused 1unit PRBC 1/6 still intermittently on NIV, ongoing diuresis   Imaging: 08/07/21 TTE LVEF [id number], grade 2 diastolic dysfunction, left atrial size is mildly dilated, trival MR, moderate mitral regurg aortic valve not visualized  Micro: 1/3 SARS COV 2 positive, flu negative 1/3 blood >   Abx: Linezolid (home medication) planning 4 weeks > stopped 1/4 1/3 azithromycin 1/4 vanc >   Interim History / Subjective:  No complaints- breathing better  Objective   Blood pressure (!) 173/91, pulse 94, temperature 98.3 F (36.8 C), temperature source Oral, resp. rate (!) 21, height 5' (1.524 m), weight 93 kg, SpO2 100 %.    Vent Mode: BIPAP FiO2 (%):  [40 %-50 %] 40 % Set Rate:  [20 bmp] 20 bmp PEEP:  [8 cmH20] 8 cmH20   Intake/Output Summary (Last 24 hours) at 11/09/2021 1023 Last data filed at 11/09/2021 0500 Gross per 24 hour  Intake 560.63 ml  Output 2200 ml  Net -1639.37 ml   Filed Weights   11/06/21 0500 11/07/21 0500 11/09/21 0500  Weight: 96.2 kg 95.7 kg 93 kg     Examination: General:  chronically ill appearing female sitting upright in bed  HEENT: MM pink/moist, pupils 4/reactive, wearing glasses Neuro:  Alert, appropriate, MAE  CV: NSR occasional PAC PULM:  mildly dyspneic with some increased WOB, still able to complete sentences, ready to go back on NIV, diffuse wheezing, prolonged expiratory wheeze, dry cough GI: soft, bs+, obese Extremities: warm/dry, trace LE edema  Skin: no rashes   Afebrile  UOP 2.2L/ 24hrs  No evidence of bleeding  Resolved Hospital Problem list   Lactic acidosis   Assessment & Plan:  Acute on chronic hypoxemic and hypercarbic respiratory failure- HOT 2L [place] COPD exacerbation COVID-19 > does not have COVID pneumonia (initial test + 10/20/2022) OSA on NIMV/ trilogy  Baseline ILD Baseline Trilogy ventilator use - continue NIV q HS, during naps, and PRN - wean supplemental O2 for sat goal > 88% - continue brovanna/ pulmicort nebs, prn albuterol - decrease prednisone to 30mg  daily for slow taper - Stopping airborne/ covid precautions after 21 days ( 1/7) - ongoing pulm hygiene- PT/ OT  HFpEF with acute exacerbation - increase coreg, given SBP in 170s - continue daily diuresis  - strict I/Os - Keep K > 4, Mag > 2   Acute on chronic normocytic anemia Pancytopenia  - unclear what precipitated; question linezolid induced vs. phlebotomy vs. Bone marrow stunning from COVID vs leflunomide (stopped 1/4 - half life 14-18 days)  - transfused 1 unit PRBC on 1/3 and 1/4 - plts  continue downtrend since 1/3, 77 > 45 - empiric SQ heparin stopped 1/7 - will consult heme, appreciate input.  Sending DIC panel, HIT panel (also does fit timing as she was already thrombocytopenic on admit from her baseline, LDH, haptoglobin  - Continue to hold eliquis   AKI on CKD IIIb > stable 1/4 - Monitor BMET and UOP- sCr improving - Replace electrolytes as needed - renal dose meds   CAD s/p CABG - continue tele   Atrial  fibrilation on tykosin - Dose adjust tykosin for renal function - goal K > 4   Left hip subQ infected seroma - Ortho seen, no need for acute intervention - WOC recs appreciated: enzymatic ointment daily - Prolonged vanc course (due to thrombocytopenia)   Psoriatic arthritis on leflunomide Rheumatoid arthritis -leflunomide stopped 1/4   Hypothryoidism - continue synthroid  Best Practice (right click and "Reselect all SmartList Selections" daily)   Diet/type: Regular consistency (see orders) DVT prophylaxis: SCD GI prophylaxis: N/A Lines: N/A Foley:  N/A Code Status:  limited no CPR in the event of a cardiac arrest but she is OK with short term intubation if necessary Last date of multidisciplinary goals of care discussion [11/3 EDP] Patient updated on plan of care. Possible transfer to [place] / [facility] 1/8     Kennieth Rad, ACNP [facility] 11/09/2021, 10:23 AM  See Amion for pager If no response to pager, please call PCCM consult pager After 7:00 pm call Elink

## 2021-11-09 NOTE — Consult Note (Signed)
[place]  [phone]   HEMATOLOGY ONCOLOGY INPATIENT CONSULTATION   [name]  DOB: 01-Jun-1946  MR#: [id number]  CSN#: [id number]    Requesting Physician: Pulmonary and critical care NP Jennelle Human   Patient Care Team: Isaac Bliss, Rayford Halsted, MD as PCP - General (Internal Medicine) Evans Lance, MD as PCP - Electrophysiology (Cardiology) Werner Lean, MD as PCP - Cardiology (Cardiology) Viona Gilmore, [facility] as Pharmacist (Pharmacist) Dimitri Ped, RN as Case Manager  Reason for consult: thrombocytopenia and anemia   History of present illness:   [name] is a 76 year old with medical history significant for recent COVID infection, hypertension, hyperlipidemia, coronary artery disease (status post CABG x3), atrial fibrillation on Eliquis, diastolic heart failure, COPD (FEV1 29% predicted), OSA on CPAP who presents to [facility] on 1[facility]3 with worsening shortness of breath and wheezing. Of note, pt underwent left hip replacement in November [id number], and a complicated with prosthetic infection, wound culture grew MRSA and that she was on linezolid since early Dec until recent admission.  Her initial CBC on admission December 02, 2021 showed 6.9, platelet 102, dropped from hemoglobin 7.8 and platelet 173 on 10/24/2021.  If the blood transfusion, linezolid was changed to vancomycin.  Over the past 4 days, her platelet continue to slightly drop, and it was 45K today, patient denies any active bleeding, except ecchymosis on her hands and abdomen from IV access and heparin injections.  MEDICAL HISTORY:  Past Medical History:  Diagnosis Date   Anemia    Arthritis    OA RIGHT KNEE WITH PAIN   Barrett esophagus    BiPAP (biphasic positive airway pressure)    Wears at night   Bradycardia 06/01/2015   CAD in native artery    a. NSTEMI 05/2015 s/p emergent CABG.   Chronic diastolic (congestive) heart failure (HCC)    Chronic  kidney disease, stage 3a (HCC)    Chronic respiratory failure (HCC)    Chronic respiratory failure with hypoxia and hypercapnia ([place]) 02/04/2010   Followed in Pulmonary clinic/ [facility] [place]       - 02 dependent  since 07/02/10 >>  83% RA December 05, 2010       - ONO RA 08/05/12  :  Positive sat < 89 x 2:86m> repeat on 2lpm rec 08/12/2012  - 06/17/2013 reported desat with activity p Knee surgery > rec restart 2lpm with activity  - 06/27/2013   Walked 2lpm  x one lap @ 185 stopped due to sat 88% not sob , desat to 82% on RA just at th   COPD III spirometry if use FEV1/VC p saba  07/18/2010   Quit smoking May 2006       - PFT's  04/12/10 FEV1  1.21 (69%) ratio 77 and no change p B2,  DLC0 56%   VC 70%         - PFTs  08/08/2013 FEV1 1.21 (60%) ratio 86 and no change p B2 DLCO 79%  VC 72%  On symbicort 160 2bid  - PFT's  02/08/2018  FEV1 0.70 (40 % ) ratio 56 if use FEV1/VC  p 38 % improvement from saba p symb 160 prior to study with DLCO  78 % corrects to 147  % for alv volume   - 02/08/2018   Cough variant asthma 02/26/2011   Followed in Pulmonary clinic/ [facility] [place]  - PFT's  06/04/15  FEV1 1.20 (67 % ) ratio  83  p 6 % improvement from saba with DLCO  80 % corrects to 132 % for alv volume      - Clinical dx based on response to symbicort       FENO 09/16/2016  =   96 on symbicort 160 2bid > added singulair  Allergy profile 09/16/2016 >  Eos 0.5 /  IgE  78 neg RAST  -  Referred to rehab 04/29/2017 > completed   Depression    Dysrhythmia    Afib   Essential hypertension 04/20/2007   Qualifier: Diagnosis of  By: Paulina Fusi, RN, Daine Gravel    GERD (gastroesophageal reflux disease)    History of ARDS 2006   History of home oxygen therapy    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 small intracranial hemorrhage in setting of HTN.   Mild carotid artery disease ([place])    a. Duplex 1-39% bilaterally in 2016.   Morbid  (severe) obesity due to excess calories ([place]) 04/22/2015   pfts with erv 14% 06/04/15  And 33% 02/08/2018    NSTEMI (non-ST elevated myocardial infarction) ([place]) 05/31/2015   Pneumococcal pneumonia ([place]) 2006   HOSPITALIZED AND DEVELOPED ARDS   Psoriasis    Psoriatic arthritis ([place])    PULMONARY FIBROSIS ILD POST INFLAMMATORY CHRONIC 07/18/2010   Followed as Primary Care Patient/ [facility]/ [place]  -s/p ARDS 2006 with bacteremic S  Pna       - CT chest 07/03/10 Nonspecific PF mostly upper lobes       - CT chest 12/03/10 acute gg changes and effusions c/w chf - PFT's  02/08/2018  FVC 0.64 (28 %)   with DLCO  78 % corrects to 147 % for alv volume      Rhematoid arthritis    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   SOB (shortness of breath) on exertion     SURGICAL HISTORY: Past Surgical History:  Procedure Laterality Date   ABDOMINOPLASTY     CARDIAC CATHETERIZATION N/A 06/01/2015   Procedure: Left Heart Cath and Coronary Angiography;  Surgeon: Lorretta Harp, MD;  Location: [place] CV LAB;  Service: Cardiovascular;  Laterality: N/A;   CARDIOVERSION N/A 02/12/2021   Procedure: CARDIOVERSION;  Surgeon: Freada Bergeron, MD;  Location: [facility] ENDOSCOPY;  Service: Cardiovascular;  Laterality: N/A;   CARPAL TUNNEL RELEASE     CHOLECYSTECTOMY     CORONARY ARTERY BYPASS GRAFT N/A 06/04/2015   Procedure: CORONARY ARTERY BYPASS GRAFT times three            with left internal mammary artery and right leg saphenous vein;  Surgeon: Gaye Pollack, MD;  Location: [place] OR;  Service: Open Heart Surgery;  Laterality: N/A;   cosmetic breast surgery     EYE SURGERY     cataract   INCISION AND DRAINAGE HIP Left 10/10/2021   Procedure: IRRIGATION AND DEBRIDEMENT HIP WITH WOUND VAC;  Surgeon: Gaynelle Arabian, MD;  Location: [facility] ORS;  Service: Orthopedics;  Laterality: Left;   JOINT REPLACEMENT     KNEE ARTHROSCOPY Left    TEE WITHOUT CARDIOVERSION  06/04/2015   Procedure: TRANSESOPHAGEAL ECHOCARDIOGRAM (TEE);  Surgeon:  Gaye Pollack, MD;  Location: [facility] OR;  Service: Open Heart Surgery;;   TEE WITHOUT CARDIOVERSION N/A 02/12/2021   Procedure: TRANSESOPHAGEAL ECHOCARDIOGRAM (TEE);  Surgeon: Freada Bergeron, MD;  Location: [place];  Service: Cardiovascular;  Laterality: N/A;  TOTAL HIP ARTHROPLASTY Left 09/16/2021   Procedure: TOTAL HIP ARTHROPLASTY ANTERIOR APPROACH;  Surgeon: Ollen Gross, MD;  Location: [facility] ORS;  Service: Orthopedics;  Laterality: Left;   TOTAL KNEE ARTHROPLASTY Left    TOTAL KNEE ARTHROPLASTY Right 06/06/2013   Procedure: RIGHT TOTAL KNEE ARTHROPLASTY;  Surgeon: Loanne Drilling, MD;  Location: [facility] ORS;  Service: Orthopedics;  Laterality: Right;    SOCIAL HISTORY: Social History   Socioeconomic History   Marital status: Widowed    Spouse name: Not on file   Number of children: Not on file   Years of education: Not on file   Highest education level: Not on file  Occupational History   Occupation: retired    Associate Professor: [employer]  Tobacco Use   Smoking status: Former    Packs/day: 1.50    Years: 58.50    Pack years: 87.75    Types: Cigarettes    Quit date: 03/03/2005    Years since quitting: 16.6   Smokeless tobacco: Never  Vaping Use   Vaping Use: Never used  Substance and Sexual Activity   Alcohol use: No   Drug use: No   Sexual activity: Not Currently  Other Topics Concern   Not on file  Social History Narrative   Not on file   Social Determinants of Health   Financial Resource Strain: Medium Risk   Difficulty of Paying Living Expenses: Somewhat hard  Food Insecurity: Not on file  Transportation Needs: Not on file  Physical Activity: Not on file  Stress: Not on file  Social Connections: Not on file  Intimate Partner Violence: Not on file    FAMILY HISTORY: Family History  Problem Relation Age of Onset   Breast cancer Mother    Coronary artery disease Father    Rheum arthritis Father     ALLERGIES:  is allergic to gabapentin.  MEDICATIONS:   Current Facility-Administered Medications  Medication Dose Route Frequency Provider Last Rate Last Admin   0.9 %  sodium chloride infusion   Intravenous PRN Alvira Monday, MD   Stopped at 11/05/21 1551   acetaminophen (TYLENOL) tablet 650 mg  650 mg Oral Q6H PRN Lupita Leash, MD       albuterol (VENTOLIN HFA) 108 (90 Base) MCG/ACT inhaler 2 puff  2 puff Inhalation Q6H PRN Tim Lair, PA-C   2 puff at 11/09/21 0840   arformoterol (BROVANA) nebulizer solution 15 mcg  15 mcg Nebulization Q12H Cloyd Stagers M, PA-C   15 mcg at 11/08/21 2019   atorvastatin (LIPITOR) tablet 40 mg  40 mg Oral Daily Cloyd Stagers M, PA-C   40 mg at 11/09/21 9407   budesonide (PULMICORT) nebulizer solution 0.5 mg  0.5 mg Nebulization BID Selmer Dominion B, NP       calcium carbonate (TUMS - dosed in mg elemental calcium) chewable tablet 400 mg of elemental calcium  2 tablet Oral Daily Luciano Cutter, MD   400 mg of elemental calcium at 11/09/21 0835   carvedilol (COREG) tablet 12.5 mg  12.5 mg Oral BID WC Norton Blizzard, NP       Chlorhexidine Gluconate Cloth 2 % PADS 6 each  6 each Topical Daily Luciano Cutter, MD   6 each at 11/08/21 6808   cholecalciferol (VITAMIN D3) tablet 2,000 Units  2,000 Units Oral Daily Tim Lair, PA-C   2,000 Units at 11/09/21 8110   collagenase (SANTYL) ointment   Topical Daily Freeman Caldron, PA-C   Given  at 11/09/21 0836   diltiazem (CARDIZEM CD) 24 hr capsule 240 mg  240 mg Oral Daily [place] Crane M, [place]   240 mg at 11/09/21 0831   docusate sodium (COLACE) capsule 100 mg  100 mg Oral BID PRN [place] Crane M, PA-C       dofetilide [facility]) capsule 125 mcg  125 mcg Oral BID Simonne Maffucci B, MD   125 mcg at 11/09/21 0830   DULoxetine (CYMBALTA) DR capsule 30 mg  30 mg Oral Daily Nevada Crane M, PA-C   30 mg at 11/09/21 0831   ferrous sulfate tablet 325 mg  325 mg Oral Q breakfast Lestine Mount, PA-C   325 mg at 11/09/21 [id number]    furosemide (LASIX) injection 60 mg  60 mg Intravenous Q12H Jennelle Human B, NP       hydroxypropyl methylcellulose / hypromellose (ISOPTO TEARS / GONIOVISC) 2.5 % ophthalmic solution 1 drop  1 drop Both Eyes TID PRN [place] Crane M, PA-C       levothyroxine (SYNTHROID) tablet 125 mcg  125 mcg Oral QAC breakfast Nevada Crane M, [place]   125 mcg at 11/09/21 0533   magnesium oxide (MAG-OX) tablet 400 mg  400 mg Oral Daily Nevada Crane M, PA-C   400 mg at 11/09/21 [id number]   montelukast (SINGULAIR) tablet 10 mg  10 mg Oral Daily Nevada Crane M, PA-C   10 mg at 11/09/21 [id number]   nitroGLYCERIN (NITROSTAT) SL tablet 0.4 mg  0.4 mg Sublingual Q5 min PRN Nevada Crane M, PA-C       pantoprazole (PROTONIX) EC tablet 40 mg  40 mg Oral QAC breakfast Nevada Crane M, PA-C   40 mg at 11/09/21 [id number]   polyethylene glycol (MIRALAX / GLYCOLAX) packet 17 g  17 g Oral Daily PRN Lestine Mount, PA-C       [START ON 11/19/2021] predniSONE (DELTASONE) tablet 30 mg  30 mg Oral Q breakfast Jennelle Human B, NP       pregabalin (LYRICA) capsule 50 mg  50 mg Oral QHS Nevada Crane M, PA-C   50 mg at 11/08/21 2100   traMADol (ULTRAM) tablet 50 mg  50 mg Oral Daily PRN Juanito Doom, MD       vancomycin (VANCOCIN) IVPB 1000 mg/200 mL premix  1,000 mg Intravenous Q48H Juanito Doom, MD   Stopped at 11/08/21 2212   vitamin B-12 (CYANOCOBALAMIN) tablet 1,000 mcg  1,000 mcg Oral Daily Nevada Crane M, PA-C   1,000 mcg at 11/09/21 [id number]    REVIEW OF SYSTEMS:   Constitutional: Denies fevers, chills or abnormal night sweats, (+) fatigue  Eyes: Denies blurriness of vision, double vision or watery eyes Ears, nose, mouth, throat, and face: Denies mucositis or sore throat Respiratory: see HPI  Cardiovascular: Denies palpitation, chest discomfort or lower extremity swelling Gastrointestinal:  Denies nausea, heartburn or change in bowel habits Skin: Denies abnormal skin rashes Lymphatics: Denies new  lymphadenopathy or easy bruising Neurological:Denies numbness, tingling or new weaknesses Behavioral/Psych: Mood is stable, no new changes  All other systems were reviewed with the patient and are negative.  PHYSICAL EXAMINATION: ECOG PERFORMANCE STATUS: 3 - Symptomatic, >50% confined to bed  Vitals:   11/09/21 1200 11/09/21 1217  BP: (!) 146/77   Pulse:  71  Resp:  (!) 25  Temp:    SpO2:  98%   Filed Weights   11/06/21 0500 11/07/21 0500 11/09/21 0500  Weight: 212 lb 1.3 oz (96.2 kg) 210 lb 15.7  oz (95.7 kg) 205 lb 0.4 oz (93 kg)    GENERAL:alert, no distress and comfortable, on BiPAP  SKIN: skin color, texture, turgor are normal, no rashes or significant lesions except diffuse ecchymosis on her hands, forearm, and abdomen EYES: normal, conjunctiva are pink and non-injected, sclera clear OROPHARYNX:no exudate, no erythema and lips, buccal mucosa, and tongue normal  NECK: supple, thyroid normal size, non-tender, without nodularity LYMPH:  no palpable lymphadenopathy in the cervical, axillary or inguinal LUNGS: clear to auscultation and percussion with normal breathing effort HEART: regular rate & rhythm and no murmurs and no lower extremity edema ABDOMEN:abdomen soft, non-tender and normal bowel sounds Musculoskeletal:no cyanosis of digits and no clubbing  PSYCH: alert & oriented x 3 with fluent speech NEURO: no focal motor/sensory deficits  LABORATORY DATA:  I have reviewed the data as listed Lab Results  Component Value Date   WBC 6.3 11/09/2021   HGB 7.8 (L) 11/09/2021   HCT 26.0 (L) 11/09/2021   MCV 94.5 11/09/2021   PLT 47 (L) 11/09/2021   Recent Labs    04/22/21 1312 06/03/21 1241 10/07/21 0010 10/08/21 0413 10/21/21 0309 10/21/21 1706 11/05/21 1024 11/05/21 1037 11/06/21 0649 11/07/21 0151 11/09/21 0219  NA  --    < > 141   < > 138   < > 140   < > 138 136 141  K  --    < > 3.6   < > 5.7*   < > 4.1   < > 5.2* 5.0 4.5  CL  --    < > 97*   < > 96*   < >  96*   < > 96* 95* 92*  CO2  --    < > 36*   < > 32   < > 31   < > 34* 33* 44*  GLUCOSE  --    < > 168*   < > 131*   < > 145*   < > 161* 135* 117*  BUN  --    < > 35*   < > 55*   < > 33*   < > 34* 46* 52*  CREATININE  --    < > 1.56*   < > 2.01*   < > 1.82*   < > 2.04* 2.14* 1.47*  CALCIUM  --    < > 8.7*   < > 9.5   < > 9.2   < > 9.0 9.1 9.0  GFRNONAA  --    < > 35*   < > 26*   < > 29*   < > 25* 24* 37*  PROT 5.5*   < > 6.5   < > 6.6  --  6.7  --   --  6.3*  --   ALBUMIN 2.9*   < > 2.7*   < > 3.0*  --  3.2*  --   --  3.1*  --   AST 15   < > 16   < > 19  --  18  --   --  14*  --   ALT 13   < > 12   < > 14  --  15  --   --  15  --   ALKPHOS 50   < > 74   < > 70  --  63  --   --  66  --   BILITOT 0.7   < > 0.5   < > 0.7  --  0.6  --   --  0.5  --   BILIDIR <0.1  --  0.1  --   --   --   --   --   --   --   --   IBILI NOT CALCULATED  --  0.4  --   --   --   --   --   --   --   --    < > = values in this interval not displayed.    RADIOGRAPHIC STUDIES: I have personally reviewed the radiological images as listed and agreed with the findings in the report. DG CHEST PORT 1 VIEW  Result Date: 11/07/2021 CLINICAL DATA:  Respiratory failure, hypoxia EXAM: PORTABLE CHEST 1 VIEW COMPARISON:  Previous studies including the examination of 11/06/2021 FINDINGS: Transverse diameter of heart is increased. Central pulmonary vessels are prominent. Increased interstitial markings are seen in both lungs with interval worsening. There is blunting of both lateral CP angles. There is no pneumothorax. IMPRESSION: Cardiomegaly. Diffuse increased interstitial markings are seen in both lungs with interval worsening suggesting worsening CHF. Small bilateral pleural effusions. Electronically Signed   By: Elmer Picker M.D.   On: 11/07/2021 12:35   DG Chest Port 1 View  Result Date: 11/06/2021 CLINICAL DATA:  Shortness of breath.  Respiratory failure. EXAM: PORTABLE CHEST 1 VIEW COMPARISON:  11/05/2021. FINDINGS: Prior  CABG. Cardiomegaly again noted. Slight increase interstitial prominence noted bilaterally. Small bilateral pleural effusions. Findings suggest CHF. No pneumothorax. IMPRESSION: Prior CABG. Cardiomegaly again noted. Interval slight increase bilateral interstitial prominence noted. Small bilateral pleural effusions. Findings consistent with CHF. Electronically Signed   By: Marcello Moores  Register M.D.   On: 11/06/2021 06:20   DG Chest Portable 1 View  Result Date: 11/05/2021 CLINICAL DATA:  Cough with shortness of breath for 3 days. COVID-19 infection last month. EXAM: PORTABLE CHEST 1 VIEW COMPARISON:  Radiographs 10/20/2021 and 10/07/2021.  CT 12/27/2020. FINDINGS: 1014 hours. Stable cardiomegaly status post median sternotomy and CABG. Slight interval improvement in bilateral interstitial prominence, suggesting improving edema. Probable trace bilateral pleural effusions. No confluent airspace opacity or pneumothorax. The bones appear unchanged. Multiple telemetry leads overlie the chest. IMPRESSION: Interval slight improvement in diffuse interstitial prominence, suggesting resolving edema. No focal airspace disease. Electronically Signed   By: Richardean Sale M.D.   On: 11/05/2021 10:24   DG Chest Portable 1 View  Result Date: 10/20/2021 CLINICAL DATA:  Shortness of breath. EXAM: PORTABLE CHEST 1 VIEW COMPARISON:  Chest radiograph 10/07/2021 FINDINGS: Low lung volumes with central vascular congestion. Diffuse hazy interstitial thickening. Probable trace bilateral pleural effusions. No pneumothorax. Stable enlarged cardiac silhouette. Postoperative changes of median sternotomy and coronary artery bypass graft. IMPRESSION: Stable cardiomegaly with mild pulmonary edema and probable trace bilateral pleural effusions. Electronically Signed   By: Ileana Roup M.D.   On: 10/20/2021 16:42    ASSESSMENT & PLAN:  77 yo female   Acute on chronic hypoxic and hypercabic respiratory failure Exacerbation Recent COVID  infection on October 20, 2021 Congestive heart failure Worsening normocytic anemia New onset thrombocytopenia since 11/05/2021  Acute on chronic renal failure Coronary artery disease, status post CABG Atrial fibrillation on Tikosyn Left hip replacement in November [id number], complicated with wound infection, (+) MRSA  Psoriatec arthritis and rheumatoid arthritis, on leflunomide  Recommendations: -I reviewed her peripheral blood smear, which showed occasional schistocytes (<1/HP). Lab showed low reticulocyte count, normal total bilirubin, LDH slightly elevated likely nonspecific, no lab evidence of hemolysis.  I have no  concern for TTP or HUS -Her worsening anemia and new thrombocytopenia is likely related to her recent use of linezolid which is known to cause marrow suppression. Pt was also on leflunomide which can cause bone marrow suppression also. The above drugs have been held for now  -other cause of her anemia and thrombocytopenia is her infection, including recent hip infection and COPD exacerbation. 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 -Hit antibody has been sent, results still pending. -In summary, her thrombocytopenia is likely multifactorial, I recommend close monitoring, and supportive care, including platelet transfusion if platelet less than 20 K, hold on pharmaceutical therapy for DVT prophylaxis, and treat underlying infection and other medical issues.  I will follow-up.    All questions were answered. The patient knows to call the clinic with any problems, questions or concerns.      Truitt Merle, MD 11/09/2021

## 2021-11-09 NOTE — Evaluation (Signed)
Occupational Therapy Evaluation Patient Details Name: [name] MRN: [id number] DOB: 07/03/1946 Today's Date: 11/09/2021   History of Present Illness 76 year old  who presents to [facility] on 1/3 with worsening shortness of breath and wheezing. 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 fib, CAD status post CABG x3, COPD, chronic hypercarbic hypoxic respiratory failure on 2 L O2 at home, OSA on CPAP, GERD, HTN, HLD, hypothyroidism, interstitial lung disease, obesity, CKD 3B, depression, psoriatic arthritis and intracranial hemorrhage   Clinical Impression   Pt has been at home with daughter assisting with transfers and bathing/dressing/toileting. Today she is mod A +2 for bed mobility and transfers - she does not maintain TDWB with pivot to the recliner today. She is max A to total A for LB ADL and mod A to min A for UB ADL. She did demonstrate some cognitive deficits today - could answer basic orientation questions but could not figure out what day came after Friday despite prompting. OT will follow acutely with focus on transfers, AE use for LB ADL, and monitor cognition. Pt will require skilled OT post-acute (per notes, the plan is [facility] which is appropriate and essential to maximize safety and independence in ADL and functional transfers)     Recommendations for follow up therapy are one component of a multi-disciplinary discharge planning process, led by the attending physician.  Recommendations may be updated based on patient status, additional functional criteria and insurance authorization.   Follow Up Recommendations  Other (comment) (LTAC)    Assistance Recommended at Discharge Frequent or constant Supervision/Assistance  Patient can return home with the following Two people to help  with walking and/or transfers;Two people to help with bathing/dressing/bathroom;Assistance with cooking/housework;Assistance with feeding;Direct supervision/assist for medications management;Direct supervision/assist for financial management;Assist for transportation;Help with stairs or ramp for entrance    Functional Status Assessment  Patient has had a recent decline in their functional status and demonstrates the ability to make significant improvements in function in a reasonable and predictable amount of time.  Equipment Recommendations  Other (comment) (AE)    Recommendations for Other Services       Precautions / Restrictions Precautions Precautions: Fall Precaution Comments: home oxygen 2 lts at rest and 3 lts with activity (stated pt) Restrictions LLE Weight Bearing: Touchdown weight bearing Other Position/Activity Restrictions: per daughter for transfers only      Mobility Bed Mobility Overal bed mobility: Needs Assistance Bed Mobility: Rolling;Supine to Sit Rolling: Min guard (rt and lt with rails)   Supine to sit: HOB elevated;Mod assist (HOB 20 with rail)     General bed mobility comments: pt reaches for rail, initiates legs over EOB, assist to raise torso and pivot hips to get feet to floor    Transfers Overall transfer level: Needs assistance Equipment used: Rolling walker (2 wheels) Transfers: Sit to/from Stand Sit to Stand: Mod assist;+2 physical assistance;+2 safety/equipment;From elevated surface (from ICU bed) Stand pivot transfers: Mod assist;+2 physical assistance;+2 safety/equipment;From elevated surface         General transfer comment: Pt used RW with cues for TDWB to LLE and pivoted to recliner with increased time/effort, assist to move RW and pt appeared to be WBAT on LLE as stepping RLE around.      Balance Overall balance assessment: Needs assistance Sitting-balance support: Feet supported;Bilateral upper extremity supported Sitting  balance-[name]:  Poor Sitting balance - Comments: leaning to right initially; needs UE support   Standing balance support: Bilateral upper extremity supported;Reliant on assistive device for balance;During functional activity Standing balance-[name]: Poor                             ADL either performed or assessed with clinical judgement   ADL Overall ADL's : Needs assistance/impaired Eating/Feeding: Independent   Grooming: Moderate assistance;Sitting   Upper Body Bathing: Moderate assistance;Sitting   Lower Body Bathing: Maximal assistance;Sitting/lateral leans   Upper Body Dressing : Moderate assistance;Sitting   Lower Body Dressing: Maximal assistance;Sit to/from Market researcher Details (indicate cue type and reason): Pt pivoted to Recliner. Please see Mobility section. Toileting- Clothing Manipulation and Hygiene: Moderate assistance;+2 for physical assistance;+2 for safety/equipment       Functional mobility during ADLs: Moderate assistance;+2 for physical assistance;+2 for safety/equipment;Rolling walker (2 wheels) General ADL Comments: Increased time/effort for all mobility. Cues for rest as needed per SpO2 demands.     Vision   Vision Assessment?: No apparent visual deficits     Perception     Praxis      Pertinent Vitals/Pain Pain Assessment: PAINAD Breathing: occasional labored breathing, short period of hyperventilation Negative Vocalization: none Facial Expression: smiling or inexpressive Body Language: tense, distressed pacing, fidgeting Consolability: no need to console PAINAD Score: 2 Pain Intervention(s): Limited activity within patient's tolerance;Monitored during session;Repositioned     Hand Dominance Right   Extremity/Trunk Assessment Upper Extremity Assessment Upper Extremity Assessment: Generalized weakness   Lower Extremity Assessment Lower Extremity Assessment: Defer to PT evaluation LLE Deficits /  Details: tends to position in ER, able to flex ~30% in supine   Cervical / Trunk Assessment Cervical / Trunk Assessment: Other exceptions Cervical / Trunk Exceptions: overweight   Communication Communication Communication: No difficulties   Cognition Arousal/Alertness: Awake/alert Behavior During Therapy: WFL for tasks assessed/performed Overall Cognitive Status: Impaired/Different from baseline Area of Impairment: Orientation                 Orientation Level: Time (oriented to month, year; not day and could not state correct day with cues)             General Comments: slight delay with following commands but also could be due to dyspnea     General Comments  Daughter present throughout session and very familiar with pt's abilities as she cares for mother at home. Sats decr to 82% (?pleth) and returned to low 90s with pursed lip breathing.    Exercises     Shoulder Instructions      Home Living Family/patient expects to be discharged to:: Other (Comment) (LTACH) Living Arrangements: Children;Other relatives Available Help at Discharge: Family;Available 24 hours/day Type of Home: House Home Access: Ramped entrance     Home Layout: Multi-level;Able to live on main level with bedroom/bathroom     Bathroom Shower/Tub: Producer, television/film/video: Standard Bathroom Accessibility: Yes   Home Equipment: Educational psychologist (4 wheels);Rolling Walker (2 wheels);Grab bars - toilet;Grab bars - tub/shower;Cane - quad;Transport chair;BSC/3in1   Additional Comments: per recent chart review      Prior Functioning/Environment Prior Level of Function : Needs assist       Physical Assist : Mobility (physical);ADLs (physical) Mobility (physical): Transfers;Bed mobility ADLs (physical): Bathing;Dressing;Toileting;IADLs Mobility Comments: has been pivoting to bed/transport chair ADLs Comments: Pt reports assistance since her THA with LE dressing,  bathing and  hygiene after using the bathroom.        OT Problem List: Decreased activity tolerance;Impaired balance (sitting and/or standing);Decreased knowledge of use of DME or AE;Obesity;Decreased strength;Cardiopulmonary status limiting activity      OT Treatment/Interventions: Self-care/ADL training;Therapeutic exercise;Therapeutic activities;Energy conservation;Patient/family education;DME and/or AE instruction;Balance training    OT Goals(Current goals can be found in the care plan section) Acute Rehab OT Goals Patient Stated Goal: to get better OT Goal Formulation: With patient Time For Goal Achievement: 11/23/21 Potential to Achieve Goals: Good ADL Goals Pt Will Perform Grooming: with modified independence;sitting Pt Will Perform Upper Body Dressing: with modified independence;sitting Pt Will Perform Lower Body Dressing: with min assist;with adaptive equipment;sit to/from stand Pt Will Transfer to Toilet: with min assist;stand pivot transfer;bedside commode Pt Will Perform Toileting - Clothing Manipulation and hygiene: with supervision;with adaptive equipment;sitting/lateral leans Additional ADL Goal #1: Pt will perform bed mobility as precursor to ADL at min guard level  OT Frequency: Min 2X/week    Co-evaluation              AM-PAC OT "6 Clicks" Daily Activity     Outcome Measure Help from another person eating meals?: None Help from another person taking care of personal grooming?: A Lot Help from another person toileting, which includes using toliet, bedpan, or urinal?: A Lot Help from another person bathing (including washing, rinsing, drying)?: A Lot Help from another person to put on and taking off regular upper body clothing?: A Lot Help from another person to put on and taking off regular lower body clothing?: Total 6 Click Score: 13   End of Session Equipment Utilized During Treatment: Gait belt;Rolling walker (2 wheels);Oxygen (4L) Nurse Communication: Mobility  status;Precautions;Weight bearing status  Activity Tolerance: Patient tolerated treatment well Patient left: in chair;with call bell/phone within reach;with chair alarm set;with nursing/sitter in room  OT Visit Diagnosis: Unsteadiness on feet (R26.81);Muscle weakness (generalized) (M62.81)                Time: [id number] OT Time Calculation (min): 33 min Charges:  OT General Charges $OT Visit: 1 Visit OT Evaluation $OT Eval Moderate Complexity: 1 Mod  Nyoka CowdenLaura H OTR/L Acute Rehabilitation Services Pager: [phone] Office: [phone]  Evern BioLaura J Danessa Mensch 11/09/2021, 5:45 PM

## 2021-11-09 NOTE — Evaluation (Signed)
Physical Therapy Evaluation Patient Details Name: [name] MRN: [id number] DOB: [birth year] Today's Date: 11/09/2021  History of Present Illness  76 year old  who presents to [facility] on 1/3 with worsening shortness of breath and wheezing. 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 fib, CAD status post CABG x3, COPD, chronic hypercarbic hypoxic respiratory failure on 2 L O2 at home, OSA on CPAP, GERD, HTN, HLD, hypothyroidism, interstitial lung disease, obesity, CKD 3B, depression, psoriatic arthritis and intracranial hemorrhage  Clinical Impression   Pt admitted secondary to problem above with deficits below. PTA patient was at home under daughter's care. She was only pivoting between bed and transport chair or BSC due to TDWB status LLE (which pt has difficulty maintaining).  Pt currently requires +2 mod assist for OOB transfer. She has had a functional decline and can benefit from additional PT to address problems listed below. Will continue to follow acutely to maximize functional mobility independence and safety.          Recommendations for follow up therapy are one component of a multi-disciplinary discharge planning process, led by the attending physician.  Recommendations may be updated based on patient status, additional functional criteria and insurance authorization.  Follow Up Recommendations PT at [facility] (pt with decr respiratory status and complex wound left hip)    Assistance Recommended at Discharge Intermittent Supervision/Assistance  Patient can return home with the following  Two people to help with walking and/or transfers;Two people to help with bathing/dressing/bathroom;Assist for transportation;Help with stairs or ramp for entrance    Equipment  Recommendations None recommended by PT (has necessary DME)  Recommendations for Other Services       Functional Status Assessment Patient has had a recent decline in their functional status and demonstrates the ability to make significant improvements in function in a reasonable and predictable amount of time.     Precautions / Restrictions Precautions Precautions: Fall Precaution Comments: home oxygen 2 lts at rest and 3 lts with activity (stated pt), Left hip VAC Restrictions LLE Weight Bearing: Touchdown weight bearing Other Position/Activity Restrictions: per daughter for transfers only      Mobility  Bed Mobility Overal bed mobility: Needs Assistance Bed Mobility: Rolling;Supine to Sit Rolling: Min guard (rt and lt with rails)   Supine to sit: HOB elevated;Mod assist (HOB 20 with rail)     General bed mobility comments: pt reaches for rail, initiates legs over EOB, assist to raise torso and pivot hips to get feet to floor    Transfers Overall transfer level: Needs assistance Equipment used: Rolling walker (2 wheels) Transfers: Sit to/from Stand Sit to Stand: Mod assist;+2 physical assistance;+2 safety/equipment;From elevated surface (from ICU bed) Stand pivot transfers: Mod assist;+2 physical assistance;+2 safety/equipment;From elevated surface         General transfer comment: Pt used RW with cues for TDWB to LLE and pivoted to recliner with increased time/effort, assist to move RW and pt appeared to be WBAT on LLE as stepping RLE around.    Ambulation/Gait               General Gait Details: for transfers only until can maintain TDWB LLE  Stairs            Wheelchair Mobility    Modified Rankin (Stroke Patients Only)  Balance Overall balance assessment: Needs assistance Sitting-balance support: Feet supported;Bilateral upper extremity supported Sitting balance-[name]: Poor Sitting balance - Comments: leaning to right initially;  needs UE support   Standing balance support: Bilateral upper extremity supported;Reliant on assistive device for balance;During functional activity Standing balance-[name]: Poor                               Pertinent Vitals/Pain Pain Assessment: PAINAD Breathing: occasional labored breathing, short period of hyperventilation Negative Vocalization: none Facial Expression: smiling or inexpressive Body Language: tense, distressed pacing, fidgeting Consolability: no need to console PAINAD Score: 2 Pain Intervention(s): Limited activity within patient's tolerance;Monitored during session    Home Living Family/patient expects to be discharged to:: Other (Comment) (LTACH) Living Arrangements: Children;Other relatives Available Help at Discharge: Family;Available 24 hours/day Type of Home: House Home Access: Ramped entrance       Home Layout: Multi-level;Able to live on main level with bedroom/bathroom Home Equipment: Air cabin crew (4 wheels);Rolling Walker (2 wheels);Grab bars - toilet;Grab bars - tub/shower;Cane - quad;Transport chair;BSC/3in1 Additional Comments: per recent chart review    Prior Function Prior Level of Function : Needs assist       Physical Assist : Mobility (physical);ADLs (physical) Mobility (physical): Transfers;Bed mobility ADLs (physical): Bathing;Dressing;Toileting;IADLs Mobility Comments: has been pivoting to bed/transport chair ADLs Comments: Pt reports assistance since her THA with LE dressing, bathing and hygiene after using the bathroom.     Hand Dominance   Dominant Hand: Right    Extremity/Trunk Assessment   Upper Extremity Assessment Upper Extremity Assessment: Defer to OT evaluation    Lower Extremity Assessment Lower Extremity Assessment: Generalized weakness LLE Deficits / Details: tends to position in ER, able to flex ~30% in supine    Cervical / Trunk Assessment Cervical / Trunk Assessment: Other  exceptions Cervical / Trunk Exceptions: overweight  Communication   Communication: No difficulties  Cognition Arousal/Alertness: Awake/alert Behavior During Therapy: WFL for tasks assessed/performed Overall Cognitive Status: Impaired/Different from baseline Area of Impairment: Orientation                 Orientation Level: Time (oriented to month, year; not day and could not state correct day with cues)             General Comments: slight delay with following commands but also could be due to dyspnea        General Comments General comments (skin integrity, edema, etc.): Daughter present throughout session and very familiar with pt's abilities as she cares for mother at home. Sats decr to 82% (?pleth) and returned to low 90s with pursed lip breathing.    Exercises     Assessment/Plan    PT Assessment Patient needs continued PT services  PT Problem List Decreased strength;Decreased range of motion;Decreased activity tolerance;Decreased balance;Decreased mobility;Decreased cognition;Decreased knowledge of use of DME;Decreased knowledge of precautions;Cardiopulmonary status limiting activity;Obesity;Pain       PT Treatment Interventions DME instruction;Functional mobility training;Therapeutic activities;Patient/family education;Wheelchair mobility training    PT Goals (Current goals can be found in the Care Plan section)  Acute Rehab PT Goals Patient Stated Goal: get strong enough to return home PT Goal Formulation: With patient Time For Goal Achievement: 11/23/21 Potential to Achieve Goals: Good    Frequency Min 5X/week     Co-evaluation               AM-PAC PT "6 Clicks" Mobility  Outcome Measure Help needed turning  from your back to your side while in a flat bed without using bedrails?: A Little Help needed moving from lying on your back to sitting on the side of a flat bed without using bedrails?: A Lot Help needed moving to and from a bed to a chair  (including a wheelchair)?: Total Help needed standing up from a chair using your arms (e.g., wheelchair or bedside chair)?: Total Help needed to walk in hospital room?: Total Help needed climbing 3-5 steps with a railing? : Total 6 Click Score: 9    End of Session Equipment Utilized During Treatment: Gait belt;Oxygen Activity Tolerance: Patient limited by fatigue;Treatment limited secondary to medical complications (Comment) (dyspnea; decr sats) Patient left: with call bell/phone within reach;in chair;with nursing/sitter in room;with family/visitor present Nurse Communication: Mobility status;Weight bearing status PT Visit Diagnosis: Other abnormalities of gait and mobility (R26.89);Difficulty in walking, not elsewhere classified (R26.2);Muscle weakness (generalized) (M62.81)    Time: [id number] PT Time Calculation (min) (ACUTE ONLY): 33 min   Charges:   PT Evaluation $PT Eval Moderate Complexity: [place], PT Acute Rehabilitation Services  Pager [phone] Office [phone]   Rexanne Mano 11/09/2021, 2:39 PM

## 2021-11-09 NOTE — Discharge Summary (Signed)
Physician Discharge Summary         Patient ID: [name] MRN: [id number] DOB/AGE: January 19, 1946 76 y.o.  Admit date: 11/05/2021 Discharge date: 11/07/2021  Discharge Diagnoses:    Acute on chronic hypoxemic and hypercarbic respiratory failure COPD exacerbation COVID-19 > does not have COVID pneumonia OSA CPAP Baseline ILD Baseline Trilogy ventilator use HFpEF with acute exacerbation Acute on chronic normocytic anemia Pancytopenia  AKI on CKD IIIb  CAD s/p CABG HLD HTN Atrial fibrillation Left hip subQ infected seroma Psoriatic arthritis  Rheumatoid arthritis Hypothryoidism  Discharge summary    76 year old woman with prior history of atrial fibrillation on Eliquis, Barrett's esophagus, CAD status post CABG x3, CHF (Echo 08/2021 65-70%), COPD (FEV1 29% predicted) on chronic oxygen 2L, ILD, OSA on NIMV, hypertension, GERD, hyperlipidemia, hypothyroidism, intracranial hemorrhage, obesity, CKD 3B, depression, and psoriatic arthritis who presented to [facility] 11/05/21 for worsening SOB in the setting of recent COVID 19 infection.   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    Patient returned to [facility] 1/3 with worsening SOB, wheezing, orthopnea, and associated CP x 3 days. CP resolved with nitro. She reports using her inhalers consistently at home. Reported some swelling in both extremities and cough but denies any fever, chills or known sick contacts.  No hemoptysis or cough productive for green/yellow sputum.   Labs demonstrate initial blood gas of 7.198/95.8 (venous) which improved to 7.58/80.4 (venous) with BiPAP and nebs. Covid +.  CMP demonstrates bicarb of 31, BUN of 33,  creatinine of 1.82 (elevated from patient's baseline).  BNP is 607. Troponins 23 to 22.  Chest x-ray with bilateral pulmonary edema.  She was transferred to [facility] and admitted to [facility] for COPD exacerbation. She was diuresed, treated with nebs, steroids, and ongoing NIMV.  Her linezolid was changed to vancomycin on 1/4 given worsening/ new pancytopenia.  Workup for her pancytopenia as below, seen by hematology on 1/7 and felt to be multifactorial.  She is to be transferred to [facility] for ongoing care.   Discharge Plan by Active Problems    Acute on chronic hypoxemic and hypercarbic respiratory failure COPD exacerbation COVID-19 > does not have COVID pneumonia OSA CPAP Baseline ILD Baseline Trilogy ventilator use - continue yupleri, brovanna, and pulmicort nebs and prn albuterol - needs NIMV with naps, sleeping, and as needed for increase WOB - wean FiO2 to goal of baseline home oxygen at 2L - slow prednisone taper, decreased to 30 mg daily on 1/7 - aggressive pulmonary hygiene/ PT/ OT   HFpEF with acute exacerbation - continue coreg - diurese prn    Acute on chronic normocytic anemia Pancytopenia  - seen by hematology 1/7, felt pancytopenia likely multifactorial with recent linezolid and leflunomide (both stopped 1/4), recent infection, and possible DIC - HIT panel pending - holding VTE ppx for now - recs for ongoing supportive care and close monitoring.  Plt transfusion if < 20K - trend CBC - continue iron and B12 supplementation    AKI on CKD IIIb > stable  - Monitor BMET and UOP - Replace electrolytes as needed   CAD s/p CABG HLD HTN - continue lipitor and coreg   Atrial fibrilation on tykosin - continue tykosin, adjust for renal function -  goal K > 4 - continue diltiazem    Left hip subQ infected seroma - Ortho seen, no need for acute intervention - WOC recs appreciated: enzymatic ointment daily - Prolonged vanc course (due to thrombocytopenia), plan  for 4 weeks from 12/15   Psoriatic arthritis on leflunomide Rheumatoid arthritis -  leflunomide stopped 1/4 given pancytopenia    Hypothryoidism - continue synthroid   Significant Hospital tests/ studies   Procedures    Culture data/antimicrobials   1/3 SARS> positive            Flu > neg 1/3 MRSA pcr > neg 1/3 Bcx2 > ngtd  1/3 azithromycin  1/3 linezolid > 1/4 1/3 vanc >    Consults  Hematology    Discharge Exam: BP (!) 157/78    Pulse 87    Temp 99.3 F (37.4 C) (Axillary)    Resp (!) 24    Ht 5' (1.524 m)    Wt 93.1 kg    SpO2 99%    BMI 40.08 kg/m   General:  chronically ill appearing elderly female sitting upright in bed in NAD on NIV HEENT: full face mask in place Neuro: Awake, oriented x 3, MAE CV: NSR, rr ir  PULM:  non labored, diminished throughout, no wheeze  GI: obese, +bs, NT, purwick Extremities: warm/dry, no LE edema    Labs at discharge   Lab Results  Component Value Date   CREATININE 1.36 (H) 11/09/2021   BUN 48 (H) 11/09/2021   NA 140 11/06/2021   K 4.3 11/03/2021   CL 87 (L) 11/09/2021   CO2 >45 (H) 11/09/2021   Lab Results  Component Value Date   WBC 5.9 12/02/2021   HGB 8.2 (L) 11/24/2021   HCT 27.1 (L) 11/03/2021   MCV 94.1 11/06/2021   PLT 49 (L) 11/11/2021   Lab Results  Component Value Date   ALT 15 11/07/2021   AST 14 (L) 11/07/2021   ALKPHOS 66 11/07/2021   BILITOT 0.5 11/07/2021   Lab Results  Component Value Date   INR 1.0 11/09/2021   INR 1.1 09/06/2021   INR 0.96 08/25/2018    Current radiological studies    No results found.  [facility]position[facility]    Discharge disposition: [place] Not Defined         Allergies as of 11/11/2021       Reactions   Gabapentin Other (See Comments)   Dizziness, lighthead        Medication List     STOP taking these medications    acetaminophen 650 MG CR tablet Commonly known as: TYLENOL Replaced by:  acetaminophen 325 MG tablet   albuterol (2.5 MG/3ML) 0.083% nebulizer solution Commonly known as: PROVENTIL Replaced by: albuterol 108 (90 Base) MCG/ACT inhaler   apixaban 5 MG Tabs tablet Commonly known as: Eliquis   ferrous sulfate 325 (65 FE) MG tablet   formoterol 20 MCG/2ML nebulizer solution Commonly known as: PERFOROMIST Replaced by: arformoterol 15 MCG/2ML Nebu   golimumab 50 MG/4ML Soln injection Commonly known as: SIMPONI ARIA   hydrocortisone 2.5 % cream   Incruse Ellipta 62.5 MCG/ACT Aepb Generic drug: umeclidinium bromide   ketoconazole 2 % cream Commonly known as: NIZORAL   leflunomide 20 MG tablet Commonly known as: Arava   linezolid 600 MG tablet Commonly known as: ZYVOX   losartan 50 MG tablet Commonly known as: COZAAR   Magnesium Oxide 400 MG Caps   metoprolol tartrate 25 MG  tablet Commonly known as: LOPRESSOR   nitroGLYCERIN 0.4 MG SL tablet Commonly known as: NITROSTAT   nystatin powder Commonly known as: MYCOSTATIN/NYSTOP   ondansetron 4 MG disintegrating tablet Commonly known as: ZOFRAN-ODT   OXYGEN   pantoprazole 40 MG tablet Commonly known as: PROTONIX   pregabalin 50 MG capsule Commonly known as: LYRICA   torsemide 20 MG tablet Commonly known as: DEMADEX   traMADol 50 MG tablet Commonly known as: ULTRAM   triamcinolone cream 0.1 % Commonly known as: KENALOG   TUMS ULTRA 1000 PO   vitamin B-12 1000 MCG tablet Commonly known as: CYANOCOBALAMIN   Vitamin D3 50 MCG (2000 UT) Tabs       TAKE these medications    acetaminophen 325 MG tablet Commonly known as: TYLENOL Take 2 tablets (650 mg total) by mouth every 6 (six) hours as needed for mild pain or moderate pain. Replaces: acetaminophen 650 MG CR tablet   albuterol 108 (90 Base) MCG/ACT inhaler Commonly known as: VENTOLIN HFA Inhale 2 puffs into the lungs every 4 (four) hours as needed for wheezing or shortness of breath. Replaces: albuterol (2.5 MG/3ML)  0.083% nebulizer solution   arformoterol 15 MCG/2ML Nebu Commonly known as: BROVANA Take 2 mLs (15 mcg total) by nebulization every 12 (twelve) hours. Replaces: formoterol 20 MCG/2ML nebulizer solution   atorvastatin 40 MG tablet Commonly known as: LIPITOR Take 40 mg by mouth daily.   budesonide 0.25 MG/2ML nebulizer solution Commonly known as: Pulmicort Take 4 mLs (0.5 mg total) by nebulization 2 (two) times daily. What changed:  how much to take how to take this when to take this additional instructions   carvedilol 12.5 MG tablet Commonly known as: COREG Take 1 tablet (12.5 mg total) by mouth 2 (two) times daily with a meal.   collagenase ointment Commonly known as: SANTYL Apply topically daily. Start taking on: November 11, 2021   diltiazem 240 MG 24 hr capsule Commonly known as: CARDIZEM CD Take 1 capsule (240 mg total) by mouth daily.   dofetilide 125 MCG capsule Commonly known as: TIKOSYN Take 1 capsule (125 mcg total) by mouth 2 (two) times daily. What changed:  medication strength how much to take when to take this   DULoxetine 30 MG capsule Commonly known as: CYMBALTA Take 1 capsule (30 mg total) by mouth daily. Start taking on: November 11, 2021   levothyroxine 125 MCG tablet Commonly known as: SYNTHROID TAKE 1 TABLET BY MOUTH EVERY DAY What changed: when to take this   levothyroxine 125 MCG tablet Commonly known as: SYNTHROID Take 1 tablet (125 mcg total) by mouth daily before breakfast. Start taking on: November 11, 2021 What changed: You were already taking a medication with the same name, and this prescription was added. Make sure you understand how and when to take each.   montelukast 10 MG tablet Commonly known as: SINGULAIR Take 10 mg by mouth daily.   predniSONE 10 MG tablet Commonly known as: DELTASONE Take 3 tablets (30 mg total) by mouth daily with breakfast. Start taking on: November 11, 2021   revefenacin 175 MCG/3ML nebulizer  solution Commonly known as: YUPELRI Take 3 mLs (175 mcg total) by nebulization daily.   vancomycin 1-5 GM/200ML-% Soln Commonly known as: VANCOCIN Inject 200 mLs (1,000 mg total) into the vein every other day for 4 days.         Follow-up appointment   Per [facility]  Discharge Condition:    stable     Kennieth Rad,  ACNP [facility] 11/24/2021, 2:49 PM  See Amion for pager If no response to pager, please call PCCM consult pager After 7:00 pm call Elink

## 2021-11-09 NOTE — Progress Notes (Signed)
Pharmacy Antibiotic Note  [name] is a 76 y.o. 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 Previously started on Linezolid for +MRSA deep wound culture- discontinued due to anemia and thrombocytopenia. Pharmacy has been consulted for Vancomycin dosing. SCr improved to 1.47 today.  Plan: Continue vancomycin 1g IV q48hr (estimated AUC 440, SCr used 1.47)  Monitor clinical progress, c/s, renal function F/u de-escalation plan/LOT, vancomycin levels at steady state  Height: 5' (152.4 cm) Weight: 93 kg (205 lb 0.4 oz) IBW/kg (Calculated) : 45.5  Temp (24hrs), Avg:98.3 F (36.8 C), Min:97.6 F (36.4 C), Max:98.8 F (37.1 C)  Recent Labs  Lab 11/05/21 1024 11/05/21 1424 11/05/21 1709 11/05/21 2159 11/06/21 0649 11/07/21 0151 11/09/21 0219  WBC 7.5 5.9  --   --  4.5 7.9 6.3  CREATININE 1.82*  --  2.00* 2.00* 2.04* 2.14* 1.47*  LATICACIDVEN 1.4  --  3.0* 2.5*  --   --   --      Estimated Creatinine Clearance: 34.2 mL/min (A) (by C-G formula based on SCr of 1.47 mg/dL (H)).    Allergies  Allergen Reactions   Gabapentin Other (See Comments)    Dizziness, lighthead    Leia Alf, PharmD, BCPS Please check AMION for all [facility] contact numbers Clinical Pharmacist 11/09/2021 11:48 AM

## 2021-11-09 NOTE — Progress Notes (Signed)
VBG obtained at daughters request and does show continued retention. Patient's only symptom is mild confusion. Will put back on NIV and encourage this any time she gets confused.  Erskine Emery MD PCCM

## 2021-11-10 ENCOUNTER — Inpatient Hospital Stay
Admission: EM | Admit: 2021-11-10 | Discharge: 2021-12-04 | Disposition: E | Payer: Medicare Other | Source: Intra-hospital

## 2021-11-10 ENCOUNTER — Other Ambulatory Visit (HOSPITAL_COMMUNITY): Payer: Medicare Other

## 2021-11-10 DIAGNOSIS — J189 Pneumonia, unspecified organism: Secondary | ICD-10-CM

## 2021-11-10 DIAGNOSIS — U071 COVID-19: Secondary | ICD-10-CM

## 2021-11-10 DIAGNOSIS — Z9049 Acquired absence of other specified parts of digestive tract: Secondary | ICD-10-CM | POA: Diagnosis not present

## 2021-11-10 DIAGNOSIS — B9562 Methicillin resistant Staphylococcus aureus infection as the cause of diseases classified elsewhere: Secondary | ICD-10-CM | POA: Diagnosis not present

## 2021-11-10 DIAGNOSIS — R609 Edema, unspecified: Secondary | ICD-10-CM

## 2021-11-10 DIAGNOSIS — I517 Cardiomegaly: Secondary | ICD-10-CM | POA: Diagnosis not present

## 2021-11-10 DIAGNOSIS — N1832 Chronic kidney disease, stage 3b: Secondary | ICD-10-CM | POA: Diagnosis not present

## 2021-11-10 DIAGNOSIS — R1310 Dysphagia, unspecified: Secondary | ICD-10-CM | POA: Diagnosis not present

## 2021-11-10 DIAGNOSIS — J9621 Acute and chronic respiratory failure with hypoxia: Secondary | ICD-10-CM

## 2021-11-10 DIAGNOSIS — J441 Chronic obstructive pulmonary disease with (acute) exacerbation: Secondary | ICD-10-CM | POA: Diagnosis not present

## 2021-11-10 DIAGNOSIS — J9622 Acute and chronic respiratory failure with hypercapnia: Secondary | ICD-10-CM | POA: Diagnosis not present

## 2021-11-10 DIAGNOSIS — I130 Hypertensive heart and chronic kidney disease with heart failure and stage 1 through stage 4 chronic kidney disease, or unspecified chronic kidney disease: Secondary | ICD-10-CM | POA: Diagnosis not present

## 2021-11-10 DIAGNOSIS — G4733 Obstructive sleep apnea (adult) (pediatric): Secondary | ICD-10-CM | POA: Diagnosis not present

## 2021-11-10 DIAGNOSIS — R0603 Acute respiratory distress: Secondary | ICD-10-CM | POA: Diagnosis not present

## 2021-11-10 DIAGNOSIS — R531 Weakness: Secondary | ICD-10-CM | POA: Diagnosis not present

## 2021-11-10 DIAGNOSIS — I4891 Unspecified atrial fibrillation: Secondary | ICD-10-CM | POA: Diagnosis not present

## 2021-11-10 DIAGNOSIS — J90 Pleural effusion, not elsewhere classified: Secondary | ICD-10-CM | POA: Diagnosis not present

## 2021-11-10 DIAGNOSIS — I5032 Chronic diastolic (congestive) heart failure: Secondary | ICD-10-CM | POA: Diagnosis not present

## 2021-11-10 DIAGNOSIS — T8149XD Infection following a procedure, other surgical site, subsequent encounter: Secondary | ICD-10-CM | POA: Diagnosis not present

## 2021-11-10 DIAGNOSIS — J449 Chronic obstructive pulmonary disease, unspecified: Secondary | ICD-10-CM | POA: Diagnosis present

## 2021-11-10 DIAGNOSIS — Z87891 Personal history of nicotine dependence: Secondary | ICD-10-CM | POA: Diagnosis not present

## 2021-11-10 DIAGNOSIS — N179 Acute kidney failure, unspecified: Secondary | ICD-10-CM | POA: Diagnosis not present

## 2021-11-10 DIAGNOSIS — K2270 Barrett's esophagus without dysplasia: Secondary | ICD-10-CM | POA: Diagnosis not present

## 2021-11-10 DIAGNOSIS — Z6841 Body Mass Index (BMI) 40.0 and over, adult: Secondary | ICD-10-CM | POA: Diagnosis not present

## 2021-11-10 DIAGNOSIS — D61818 Other pancytopenia: Secondary | ICD-10-CM | POA: Diagnosis not present

## 2021-11-10 DIAGNOSIS — D649 Anemia, unspecified: Secondary | ICD-10-CM | POA: Diagnosis not present

## 2021-11-10 DIAGNOSIS — J9811 Atelectasis: Secondary | ICD-10-CM | POA: Diagnosis not present

## 2021-11-10 DIAGNOSIS — J9601 Acute respiratory failure with hypoxia: Secondary | ICD-10-CM | POA: Diagnosis not present

## 2021-11-10 DIAGNOSIS — K567 Ileus, unspecified: Secondary | ICD-10-CM

## 2021-11-10 DIAGNOSIS — Z96642 Presence of left artificial hip joint: Secondary | ICD-10-CM | POA: Diagnosis not present

## 2021-11-10 DIAGNOSIS — I482 Chronic atrial fibrillation, unspecified: Secondary | ICD-10-CM

## 2021-11-10 DIAGNOSIS — Z96652 Presence of left artificial knee joint: Secondary | ICD-10-CM | POA: Diagnosis not present

## 2021-11-10 DIAGNOSIS — E039 Hypothyroidism, unspecified: Secondary | ICD-10-CM | POA: Diagnosis not present

## 2021-11-10 DIAGNOSIS — E785 Hyperlipidemia, unspecified: Secondary | ICD-10-CM | POA: Diagnosis not present

## 2021-11-10 DIAGNOSIS — N19 Unspecified kidney failure: Secondary | ICD-10-CM

## 2021-11-10 DIAGNOSIS — E46 Unspecified protein-calorie malnutrition: Secondary | ICD-10-CM | POA: Diagnosis not present

## 2021-11-10 DIAGNOSIS — I509 Heart failure, unspecified: Secondary | ICD-10-CM | POA: Diagnosis not present

## 2021-11-10 DIAGNOSIS — Z951 Presence of aortocoronary bypass graft: Secondary | ICD-10-CM | POA: Diagnosis not present

## 2021-11-10 LAB — BASIC METABOLIC PANEL
BUN: 49 mg/dL — ABNORMAL HIGH (ref 8–23)
BUN: 48 mg/dL — ABNORMAL HIGH (ref 8–23)
CO2: >45 mmol/L — ABNORMAL HIGH (ref 22–32)
CO2: >45 mmol/L — ABNORMAL HIGH (ref 22–32)
Calcium: 9.1 mg/dL (ref 8.9–10.3)
Calcium: 9.2 mg/dL (ref 8.9–10.3)
Chloride: 86 mmol/L — ABNORMAL LOW (ref 98–111)
Chloride: 87 mmol/L — ABNORMAL LOW (ref 98–111)
Creatinine, Ser: 1.28 mg/dL — ABNORMAL HIGH (ref 0.44–1.00)
Creatinine, Ser: 1.36 mg/dL — ABNORMAL HIGH (ref 0.44–1.00)
GFR, Estimated: 44 mL/min — ABNORMAL LOW (ref >60)
GFR, Estimated: 41 mL/min — ABNORMAL LOW (ref >60)
Glucose, Bld: 114 mg/dL — ABNORMAL HIGH (ref 70–99)
Glucose, Bld: 105 mg/dL — ABNORMAL HIGH (ref 70–99)
Potassium: 4.5 mmol/L (ref 3.5–5.1)
Potassium: 4.3 mmol/L (ref 3.5–5.1)
Sodium: 139 mmol/L (ref 135–145)
Sodium: 140 mmol/L (ref 135–145)

## 2021-11-10 LAB — MAGNESIUM
Magnesium: 2.3 mg/dL (ref 1.7–2.4)
Magnesium: 2.3 mg/dL (ref 1.7–2.4)

## 2021-11-10 LAB — CBC
HCT: 27.1 % — ABNORMAL LOW (ref 36.0–46.0)
Hemoglobin: 8.2 g/dL — ABNORMAL LOW (ref 12.0–15.0)
MCH: 28.5 pg (ref 26.0–34.0)
MCHC: 30.3 g/dL (ref 30.0–36.0)
MCV: 94.1 fL (ref 80.0–100.0)
Platelets: 49 10*3/uL — ABNORMAL LOW (ref 150–400)
RBC: 2.88 MIL/uL — ABNORMAL LOW (ref 3.87–5.11)
RDW: 16.7 % — ABNORMAL HIGH (ref 11.5–15.5)
WBC: 5.9 10*3/uL (ref 4.0–10.5)
nRBC: 0.5 % — ABNORMAL HIGH (ref 0.0–0.2)

## 2021-11-10 LAB — CULTURE, BLOOD (ROUTINE X 2)
Culture: NO GROWTH
Culture: NO GROWTH
Specimen Description: ADEQUATE
Specimen Description: ADEQUATE

## 2021-11-10 LAB — PHOSPHORUS
Phosphorus: <1 mg/dL — CL (ref 2.5–4.6)
Phosphorus: 2.7 mg/dL (ref 2.5–4.6)

## 2021-11-10 LAB — GLUCOSE, CAPILLARY: Glucose-Capillary: 99 mg/dL (ref 70–99)

## 2021-11-10 LAB — HAPTOGLOBIN: Haptoglobin: 233 mg/dL (ref 42–346)

## 2021-11-10 MED ORDER — LEVOTHYROXINE SODIUM 125 MCG PO TABS: 125.0000 ug | ORAL_TABLET | Freq: Every day | ORAL | Status: AC

## 2021-11-10 MED ORDER — ACETAMINOPHEN 325 MG PO TABS: 650.0000 mg | ORAL_TABLET | Freq: Four times a day (QID) | ORAL | Status: AC | PRN

## 2021-11-10 MED ORDER — CARVEDILOL 12.5 MG PO TABS: 12.5000 mg | ORAL_TABLET | Freq: Two times a day (BID) | ORAL | Status: AC

## 2021-11-10 MED ORDER — DULOXETINE HCL 30 MG PO CPEP: 30.0000 mg | ORAL_CAPSULE | Freq: Every day | ORAL | 3 refills | Status: AC

## 2021-11-10 MED ORDER — VANCOMYCIN HCL IN DEXTROSE 1-5 GM/200ML-% IV SOLN: 1000.0000 mg | INTRAVENOUS | Status: AC

## 2021-11-10 MED ORDER — BUDESONIDE 0.25 MG/2ML IN SUSP: 0.5000 mg | Freq: Two times a day (BID) | RESPIRATORY_TRACT | 12 refills | Status: AC

## 2021-11-10 MED ORDER — REVEFENACIN 175 MCG/3ML IN SOLN: 175.0000 ug | Freq: Every day | RESPIRATORY_TRACT | Status: AC

## 2021-11-10 MED ORDER — ARFORMOTEROL TARTRATE 15 MCG/2ML IN NEBU: 15.0000 ug | INHALATION_SOLUTION | Freq: Two times a day (BID) | RESPIRATORY_TRACT | Status: AC

## 2021-11-10 MED ORDER — REVEFENACIN 175 MCG/3ML IN SOLN
175.0000 ug | Freq: Every day | RESPIRATORY_TRACT | Status: DC
  Filled 2021-11-10: qty 3

## 2021-11-10 MED ORDER — DOFETILIDE 125 MCG PO CAPS: 125.0000 ug | ORAL_CAPSULE | Freq: Two times a day (BID) | ORAL | Status: AC

## 2021-11-10 MED ORDER — ALBUTEROL SULFATE HFA 108 (90 BASE) MCG/ACT IN AERS
2.0000 | INHALATION_SPRAY | RESPIRATORY_TRACT | Status: AC | PRN
Start: 2021-11-10 — End: ?

## 2021-11-10 MED ORDER — COLLAGENASE 250 UNIT/GM EX OINT
TOPICAL_OINTMENT | Freq: Every day | CUTANEOUS | 0 refills | Status: AC
Start: 2021-11-11 — End: ?

## 2021-11-10 MED ORDER — PREDNISONE 10 MG PO TABS: 30.0000 mg | ORAL_TABLET | Freq: Every day | ORAL | Status: AC

## 2021-11-10 NOTE — TOC Transition Note (Signed)
Transition of Care [facility](TOC) - CM/SW Discharge Note   Patient Details  Name: [name]  MRN: [id number] Date of Birth: 04/28/1946  Transition of Care [facility](TOC) CM/SW Contact:  Bess KindsWendi B Ladarren Steiner, RN Phone Number: [phone] 11/11/2021, 12:16 PM   Clinical Narrative:     Patient to transition to [facility] today. No further TOC needs identified at this time.   Final next level of care: Long Term Acute Care (LTAC) Barriers to Discharge: No Barriers Identified   Patient Goals and CMS Choice Patient states their goals for this hospitalization and ongoing recovery are:: To return home after recovery. CMS Medicare.gov Compare Post Acute Care list provided to:: Patient Choice offered to / list presented to : Patient, Adult Children (Daughter, Cala BradfordKimberly.)  Discharge Placement                       Discharge Plan and Services   Discharge Planning Services: CM Consult Post Acute Care Choice: Long Term Acute Care (LTAC) ([facility])                               Social Determinants of Health (SDOH) Interventions     Readmission Risk Interventions Readmission Risk Prevention Plan 10/23/2021 06/21/2021  Transportation Screening Complete Complete  PCP or Specialist Appt within 5-7 Days - Complete  Home Care Screening - Complete  Medication Review (RN CM) - Complete  Medication Review Oceanographer(RN Care Manager) Complete -  PCP or Specialist appointment within 3-5 days of discharge Complete -  HRI or Home Care Consult Complete -  SW Recovery Care/Counseling Consult Complete -  Palliative Care Screening Not Applicable -  Skilled Nursing Facility Not Applicable -  Some recent data might be hidden

## 2021-11-10 NOTE — Progress Notes (Signed)
Patient belongings packed up by patient's daughter, [name]. Patient to be discharged to [facility] [phone] with bilateral AC PIV saline locked. Report called to receiving RN, all questions answered at this time. RT made aware of patient discharging from unit.  Daughter at bedside, all questions answered and plan of care discussed.   Skin: Stage 1 sacrum and left ankle Open wound on Left Hip from  previous surgery

## 2021-11-11 ENCOUNTER — Telehealth: Payer: Medicare Other

## 2021-11-11 LAB — COMPREHENSIVE METABOLIC PANEL
ALT: 13 U/L (ref 0–44)
AST: 14 U/L — ABNORMAL LOW (ref 15–41)
Albumin: 2.8 g/dL — ABNORMAL LOW (ref 3.5–5.0)
Alkaline Phosphatase: 54 U/L (ref 38–126)
BUN: 44 mg/dL — ABNORMAL HIGH (ref 8–23)
CO2: >45 mmol/L — ABNORMAL HIGH (ref 22–32)
Calcium: 9 mg/dL (ref 8.9–10.3)
Chloride: 86 mmol/L — ABNORMAL LOW (ref 98–111)
Creatinine, Ser: 1.13 mg/dL — ABNORMAL HIGH (ref 0.44–1.00)
GFR, Estimated: 51 mL/min — ABNORMAL LOW (ref >60)
Glucose, Bld: 100 mg/dL — ABNORMAL HIGH (ref 70–99)
Potassium: 3.6 mmol/L (ref 3.5–5.1)
Sodium: 141 mmol/L (ref 135–145)
Total Bilirubin: 0.9 mg/dL (ref 0.3–1.2)
Total Protein: 5.4 g/dL — ABNORMAL LOW (ref 6.5–8.1)

## 2021-11-11 LAB — CBC WITH DIFFERENTIAL/PLATELET
Abs Immature Granulocytes: 0.03 10*3/uL (ref 0.00–0.07)
Basophils Absolute: 0 10*3/uL (ref 0.0–0.1)
Basophils Relative: 0 %
Eosinophils Absolute: 0 10*3/uL (ref 0.0–0.5)
Eosinophils Relative: 0 %
HCT: 25.9 % — ABNORMAL LOW (ref 36.0–46.0)
Hemoglobin: 7.9 g/dL — ABNORMAL LOW (ref 12.0–15.0)
Immature Granulocytes: 0 %
Lymphocytes Relative: 18 %
Lymphs Abs: 1.3 10*3/uL (ref 0.7–4.0)
MCH: 28.5 pg (ref 26.0–34.0)
MCHC: 30.5 g/dL (ref 30.0–36.0)
MCV: 93.5 fL (ref 80.0–100.0)
Monocytes Absolute: 1 10*3/uL (ref 0.1–1.0)
Monocytes Relative: 14 %
Neutro Abs: 4.9 10*3/uL (ref 1.7–7.7)
Neutrophils Relative %: 68 %
Platelets: 58 10*3/uL — ABNORMAL LOW (ref 150–400)
RBC: 2.77 MIL/uL — ABNORMAL LOW (ref 3.87–5.11)
RDW: 16 % — ABNORMAL HIGH (ref 11.5–15.5)
WBC: 7.2 10*3/uL (ref 4.0–10.5)
nRBC: 0 % (ref 0.0–0.2)

## 2021-11-11 LAB — URINALYSIS, ROUTINE W REFLEX MICROSCOPIC
Bilirubin Urine: NEGATIVE
Glucose, UA: NEGATIVE mg/dL
Ketones, ur: NEGATIVE mg/dL
Nitrite: NEGATIVE
Protein, ur: NEGATIVE mg/dL
Specific Gravity, Urine: <1.005 — ABNORMAL LOW (ref 1.005–1.030)
pH: 6 (ref 5.0–8.0)

## 2021-11-11 LAB — TSH: TSH: 3.393 u[IU]/mL (ref 0.350–4.500)

## 2021-11-11 LAB — PHOSPHORUS: Phosphorus: 1.7 mg/dL — ABNORMAL LOW (ref 2.5–4.6)

## 2021-11-11 LAB — HEMOGLOBIN A1C
Hgb A1c MFr Bld: 6 % — ABNORMAL HIGH (ref 4.8–5.6)
Mean Plasma Glucose: 125.5 mg/dL

## 2021-11-11 LAB — URINALYSIS, MICROSCOPIC (REFLEX)

## 2021-11-11 LAB — HEPARIN INDUCED PLATELET AB (HIT ANTIBODY): Heparin Induced Plt Ab: 0.069 OD (ref 0.000–0.400)

## 2021-11-11 LAB — PROTIME-INR
INR: 0.9 (ref 0.8–1.2)
Prothrombin Time: 11.8 seconds (ref 11.4–15.2)

## 2021-11-11 LAB — MAGNESIUM: Magnesium: 2.3 mg/dL (ref 1.7–2.4)

## 2021-11-11 NOTE — Telephone Encounter (Signed)
Patient's daughter is returning a call about CPAP machine. Patient is currently admitted in hospital

## 2021-11-12 DIAGNOSIS — U071 COVID-19: Secondary | ICD-10-CM

## 2021-11-12 DIAGNOSIS — J9621 Acute and chronic respiratory failure with hypoxia: Secondary | ICD-10-CM

## 2021-11-12 DIAGNOSIS — I482 Chronic atrial fibrillation, unspecified: Secondary | ICD-10-CM | POA: Diagnosis not present

## 2021-11-12 DIAGNOSIS — J449 Chronic obstructive pulmonary disease, unspecified: Secondary | ICD-10-CM | POA: Diagnosis not present

## 2021-11-12 DIAGNOSIS — I5032 Chronic diastolic (congestive) heart failure: Secondary | ICD-10-CM | POA: Diagnosis not present

## 2021-11-12 LAB — URINE CULTURE

## 2021-11-12 LAB — PHOSPHORUS: Phosphorus: 3.5 mg/dL (ref 2.5–4.6)

## 2021-11-12 NOTE — Consult Note (Signed)
Pulmonary Critical Care Medicine [facility] [place]  PULMONARY SERVICE  Date of Service: 11/12/2021  PULMONARY CRITICAL CARE CONSULT   [name]  DHW:[id number]  DOB: 1946/08/26   DOA: 11/13/2021  Referring Physician: Luna Kitchens, MD  HPI: [name] is a 76 y.o. female seen for follow up of Acute on Chronic Respiratory Failure.  Patient has multiple medical problems including chronic diastolic heart failure chronic respiratory failure kidney disease severe COPD depression anxiety hypothyroidism hyperlipidemia presented into the hospital because of increasing shortness of breath.  The patient had worsening going on since having had the COVID-19 virus infection.  The patient was admitted back in December for a periprosthetic infection after a hip replacement.  Patient was started on antibiotics to cover MRSA and also had a wound VAC.  Came back in January 3 because of worsening symptoms of shortness of breath and also had been having some chest pain.  The patient had been placed on BiPAP with improvement in the ABGs transferred to our facility for further management patient is been using the BiPAP at nighttime and goes on nasal cannula during the daytime  Review of Systems:  ROS performed and is unremarkable other than noted above.  Past Medical History:  Diagnosis Date   Anemia    Arthritis    OA RIGHT KNEE WITH PAIN   Barrett esophagus    BiPAP (biphasic positive airway pressure)    Wears at night   Bradycardia 06/01/2015   CAD in native artery    a. NSTEMI 05/2015 s/p emergent CABG.   Chronic diastolic (congestive) heart failure (HCC)    Chronic kidney disease, stage 3a (HCC)    Chronic respiratory failure (HCC)    Chronic respiratory failure with hypoxia and hypercapnia (HCC) 02/04/2010   Followed in Pulmonary clinic/ [facility]/ [place]       - 02 dependent  since 07/02/10 >>  83% RA December 05, 2010       - ONO RA 08/05/12  :  Positive sat <  89 x 2:62m> repeat on 2lpm rec 08/12/2012  - 06/17/2013 reported desat with activity p Knee surgery > rec restart 2lpm with activity  - 06/27/2013   Walked 2lpm  x one lap @ 185 stopped due to sat 88% not sob , desat to 82% on RA just at th   COPD III spirometry if use FEV1/VC p saba  07/18/2010   Quit smoking May 2006       - PFT's  04/12/10 FEV1  1.21 (69%) ratio 77 and no change p B2,  DLC0 56%   VC 70%         - PFTs  08/08/2013 FEV1 1.21 (60%) ratio 86 and no change p B2 DLCO 79%  VC 72%  On symbicort 160 2bid  - PFT's  02/08/2018  FEV1 0.70 (40 % ) ratio 56 if use FEV1/VC  p 38 % improvement from saba p symb 160 prior to study with DLCO  78 % corrects to 147  % for alv volume   - 02/08/2018   Cough variant asthma 02/26/2011   Followed in Pulmonary clinic/ [facility] [place]  - PFT's  06/04/15  FEV1 1.20 (67 % ) ratio 83  p 6 % improvement from saba with DLCO  80 % corrects to 132 % for alv volume      - Clinical dx based on response to symbicort       FENO 09/16/2016  =  96 on symbicort 160 2bid > added singulair  Allergy profile 09/16/2016 >  Eos 0.5 /  IgE  78 neg RAST  -  Referred to rehab 04/29/2017 > completed   Depression    Dysrhythmia    Afib   Essential hypertension 04/20/2007   Qualifier: Diagnosis of  By: Paulina Fusi, RN, Daine Gravel    GERD (gastroesophageal reflux disease)    History of ARDS 2006   History of home oxygen therapy    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 small intracranial hemorrhage in setting of HTN.   Mild carotid artery disease ([place])    a. Duplex 1-39% bilaterally in 2016.   Morbid (severe) obesity due to excess calories ([place]) 04/22/2015   pfts with erv 14% 06/04/15  And 33% 02/08/2018    NSTEMI (non-ST elevated myocardial infarction) ([place]) 05/31/2015   Pneumococcal pneumonia ([place]) 2006   HOSPITALIZED AND DEVELOPED ARDS   Psoriasis    Psoriatic arthritis ([place])    PULMONARY FIBROSIS ILD  POST INFLAMMATORY CHRONIC 07/18/2010   Followed as Primary Care Patient/ [facility] [place]  -s/p ARDS 2006 with bacteremic S  Pna       - CT chest 07/03/10 Nonspecific PF mostly upper lobes       - CT chest 12/03/10 acute gg changes and effusions c/w chf - PFT's  02/08/2018  FVC 0.64 (28 %)   with DLCO  78 % corrects to 147 % for alv volume      Rhematoid arthritis    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   SOB (shortness of breath) on exertion     Past Surgical History:  Procedure Laterality Date   ABDOMINOPLASTY     CARDIAC CATHETERIZATION N/A 06/01/2015   Procedure: Left Heart Cath and Coronary Angiography;  Surgeon: Lorretta Harp, MD;  Location: [facility];  Service: Cardiovascular;  Laterality: N/A;   CARDIOVERSION N/A 02/12/2021   Procedure: CARDIOVERSION;  Surgeon: Freada Bergeron, MD;  Location: [facility] ENDOSCOPY;  Service: Cardiovascular;  Laterality: N/A;   CARPAL TUNNEL RELEASE     CHOLECYSTECTOMY     CORONARY ARTERY BYPASS GRAFT N/A 06/04/2015   Procedure: CORONARY ARTERY BYPASS GRAFT times three            with left internal mammary artery and right leg saphenous vein;  Surgeon: Gaye Pollack, MD;  Location: [facility];  Service: Open Heart Surgery;  Laterality: N/A;   cosmetic breast surgery     EYE SURGERY     cataract   INCISION AND DRAINAGE HIP Left 10/10/2021   Procedure: IRRIGATION AND DEBRIDEMENT HIP WITH WOUND VAC;  Surgeon: Gaynelle Arabian, MD;  Location: [facility] ORS;  Service: Orthopedics;  Laterality: Left;   JOINT REPLACEMENT     KNEE ARTHROSCOPY Left    TEE WITHOUT CARDIOVERSION  06/04/2015   Procedure: TRANSESOPHAGEAL ECHOCARDIOGRAM (TEE);  Surgeon: Gaye Pollack, MD;  Location: [facility] OR;  Service: Open Heart Surgery;;   TEE WITHOUT CARDIOVERSION N/A 02/12/2021   Procedure: TRANSESOPHAGEAL ECHOCARDIOGRAM (TEE);  Surgeon: Freada Bergeron, MD;  Location: [facility] ENDOSCOPY;  Service: Cardiovascular;  Laterality: N/A;   TOTAL HIP ARTHROPLASTY Left 09/16/2021   Procedure: TOTAL  HIP ARTHROPLASTY ANTERIOR APPROACH;  Surgeon: Gaynelle Arabian, MD;  Location: [facility] ORS;  Service: Orthopedics;  Laterality: Left;   TOTAL KNEE ARTHROPLASTY Left    TOTAL KNEE ARTHROPLASTY Right 06/06/2013   Procedure:  RIGHT TOTAL KNEE ARTHROPLASTY;  Surgeon: Gearlean Alf, MD;  Location: [facility] ORS;  Service: Orthopedics;  Laterality: Right;    Social History:    reports that she quit smoking about 16 years ago. Her smoking use included cigarettes. She has a 87.75 pack-year smoking history. She has never used smokeless tobacco. She reports that she does not drink alcohol and does not use drugs.  Family History: Non-Contributory to the present illness  Allergies  Allergen Reactions   Gabapentin Other (See Comments)    Dizziness, lighthead    Medications: Reviewed on Rounds  Physical Exam:  Vitals: Temperature 98.6 pulse 60 respiratory 24 blood pressure is 138/78 saturations 92%  Ventilator Settings patient is on 3 L nasal cannula off BiPAP this morning  General: Comfortable at this time Eyes: Grossly normal lids, irises & conjunctiva ENT: grossly tongue is normal Neck: no obvious mass Cardiovascular: S1-S2 normal no gallop or rub Respiratory: No rhonchi very coarse breath sounds Abdomen: Soft and nontender Skin: no rash seen on limited exam Musculoskeletal: not rigid Psychiatric:unable to assess Neurologic: no seizure no involuntary movements         Labs on Admission:  Basic Metabolic Panel: Recent Labs  Lab 11/06/21 0649 11/07/21 0151 11/09/21 0219 11/14/2021 0227 11/15/2021 0457 11/11/21 0553 11/12/21 0401  NA 138 136 141 139 140 141  --   K 5.2* 5.0 4.5 4.5 4.3 3.6  --   CL 96* 95* 92* 86* 87* 86*  --   CO2 34* 33* 44* >45* >45* >45*  --   GLUCOSE 161* 135* 117* 114* 105* 100*  --   BUN 34* 46* 52* 49* 48* 44*  --   CREATININE 2.04* 2.14* 1.47* 1.28* 1.36* 1.13*  --   CALCIUM 9.0 9.1 9.0 9.1 9.2 9.0  --   MG 2.3  --  2.4 2.3 2.3 2.3  --   PHOS 4.3  --  3.2 <1.0*  2.7 1.7* 3.5    Recent Labs  Lab 11/05/21 1037 11/05/21 1238 11/06/21 2149 11/09/21 1430  HCO3 37.3* 35.9* 36.6* 45.2*  O2SAT 50.0 68.0 71.9 68.0    Liver Function Tests: Recent Labs  Lab 11/05/21 1024 11/07/21 0151 11/11/21 0553  AST 18 14* 14*  ALT 15 15 13   ALKPHOS 63 66 54  BILITOT 0.6 0.5 0.9  PROT 6.7 6.3* 5.4*  ALBUMIN 3.2* 3.1* 2.8*   No results for input(s): LIPASE, AMYLASE in the last 168 hours. No results for input(s): AMMONIA in the last 168 hours.  CBC: Recent Labs  Lab 11/05/21 1024 11/05/21 1037 11/05/21 1424 11/06/21 0649 11/06/21 1536 11/07/21 0151 11/09/21 0219 11/09/21 1041 12/02/2021 0227 11/11/21 0553  WBC 7.5  --  5.9 4.5  --  7.9 6.3  --  5.9 7.2  NEUTROABS 6.2  --  5.6  --   --  6.7  --   --   --  4.9  HGB 6.9*   < > 6.7* 6.7* 8.6* 9.3* 7.8*  --  8.2* 7.9*  HCT 22.7*   < > 22.3* 22.8* 26.7* 29.3* 26.0*  --  27.1* 25.9*  MCV 97.8  --  97.4 95.0  --  93.0 94.5  --  94.1 93.5  PLT 102*  --  91* 87*  --  77* 45* 47* 49* 58*   < > = values in this interval not displayed.    Cardiac Enzymes: No results for input(s): CKTOTAL, CKMB, CKMBINDEX, TROPONINI in the last 168 hours.  BNP (last 3  results) Recent Labs    06/18/21 0912 10/20/21 1645 11/05/21 1024  BNP 100.8* 287.0* 607.4*    ProBNP (last 3 results) Recent Labs    06/13/21 1303  PROBNP 588*     Radiological Exams on Admission: DG Abd 1 View  Result Date: 11/09/2021 CLINICAL DATA:  Renal failure EXAM: ABDOMEN - 1 VIEW COMPARISON:  03/10/2019 FINDINGS: Limited by habitus. Surgical clips in the right upper quadrant. Nonobstructed gas pattern. Partially visualized left hip replacement. Support device or potential catheter over the pelvic soft tissues but no catheter in the region of pelvis/bladder. IMPRESSION: 1. Nonobstructed gas pattern. 2. There is support device or potential catheter over the pelvic soft tissues, if this is a Foley catheter, no catheter tip is seen overlying  the pelvis or expected area of the bladder. Electronically Signed   By: Donavan Foil M.D.   On: 11/14/2021 18:14   DG CHEST PORT 1 VIEW  Result Date: 11/26/2021 CLINICAL DATA:  Renal failure pneumonia EXAM: PORTABLE CHEST 1 VIEW COMPARISON:  11/07/2021 FINDINGS: Post sternotomy changes. Dense mitral calcifications. Small bilateral effusions. Cardiomegaly with vascular congestion and mild interstitial edema, decreased compared to 11/07/2021. Consolidation left lung base. No visible pneumothorax. IMPRESSION: 1. Cardiomegaly with vascular congestion, mild interstitial edema and small effusions; edema is improved since 11/07/2021 2. Partial consolidation in the left lung base may be due to atelectasis or pneumonia Electronically Signed   By: Donavan Foil M.D.   On: 12/01/2021 18:12    Assessment/Plan Active Problems:   COPD (chronic obstructive pulmonary disease) ([place])   COVID-19 virus infection   Atrial fibrillation, chronic (HCC)   Chronic heart failure with preserved ejection fraction (HCC)   Acute on chronic diastolic CHF (congestive heart failure) (HCC)   Acute on chronic respiratory failure with hypoxia patient is going to continue with using the BiPAP at nighttime for hypercapnic hypoxic respiratory failure.  She seems to respond very well to it the plan is going to be to continue to monitor and then placed oxygen therapy during the daytime COVID-19 virus infection patient is in resolution has been treated on the last chest x-ray there is partial consolidation of the left lung base patient has been treated for bacterial infection. Chronic heart failure preserved ejection fraction supportive care patient's been on diuretics as well as beta-blockade Chronic atrial fibrillation rate controlled we will continue to monitor patient has been on Tikosyn Severe COPD medical management nebulizers as needed patient apparently has been on trilogy ventilator at baseline  I have personally seen and  evaluated the patient, evaluated laboratory and imaging results, formulated the assessment and plan and placed orders. The Patient requires high complexity decision making with multiple systems involvement.  Case was discussed on Rounds with the Respiratory Therapy Director and the Respiratory staff Time Spent 67minutes  Angeliki Mates A Arnold Kester, MD [facility] Pulmonary Critical Care Medicine Sleep Medicine

## 2021-11-13 LAB — CBC
HCT: 28.8 % — ABNORMAL LOW (ref 36.0–46.0)
Hemoglobin: 8.3 g/dL — ABNORMAL LOW (ref 12.0–15.0)
MCH: 28.6 pg (ref 26.0–34.0)
MCHC: 28.8 g/dL — ABNORMAL LOW (ref 30.0–36.0)
MCV: 99.3 fL (ref 80.0–100.0)
Platelets: 102 10*3/uL — ABNORMAL LOW (ref 150–400)
RBC: 2.9 MIL/uL — ABNORMAL LOW (ref 3.87–5.11)
RDW: 16.2 % — ABNORMAL HIGH (ref 11.5–15.5)
WBC: 8.6 10*3/uL (ref 4.0–10.5)
nRBC: 0 % (ref 0.0–0.2)

## 2021-11-14 ENCOUNTER — Other Ambulatory Visit (HOSPITAL_COMMUNITY): Payer: Medicare Other

## 2021-11-14 LAB — CBC
HCT: 28.9 % — ABNORMAL LOW (ref 36.0–46.0)
Hemoglobin: 8.3 g/dL — ABNORMAL LOW (ref 12.0–15.0)
MCH: 28.3 pg (ref 26.0–34.0)
MCHC: 28.7 g/dL — ABNORMAL LOW (ref 30.0–36.0)
MCV: 98.6 fL (ref 80.0–100.0)
Platelets: 133 10*3/uL — ABNORMAL LOW (ref 150–400)
RBC: 2.93 MIL/uL — ABNORMAL LOW (ref 3.87–5.11)
RDW: 17.1 % — ABNORMAL HIGH (ref 11.5–15.5)
WBC: 7.5 10*3/uL (ref 4.0–10.5)
nRBC: 0.3 % — ABNORMAL HIGH (ref 0.0–0.2)

## 2021-11-14 LAB — BLOOD GAS, ARTERIAL
Acid-Base Excess: 19.9 mmol/L — ABNORMAL HIGH (ref 0.0–2.0)
Acid-Base Excess: 16.4 mmol/L — ABNORMAL HIGH (ref 0.0–2.0)
Acid-Base Excess: 17 mmol/L — ABNORMAL HIGH (ref 0.0–2.0)
Bicarbonate: 47.6 mmol/L — ABNORMAL HIGH (ref 20.0–28.0)
Bicarbonate: 43.7 mmol/L — ABNORMAL HIGH (ref 20.0–28.0)
Bicarbonate: 44.2 mmol/L — ABNORMAL HIGH (ref 20.0–28.0)
FIO2: 40
FIO2: 40
FIO2: 40
O2 Saturation: 95.6 %
O2 Saturation: 95.1 %
O2 Saturation: 26.1 %
Patient temperature: 36.5
Patient temperature: 36.7
Patient temperature: 37
pCO2 arterial: 105 mmHg (ref 32.0–48.0)
pCO2 arterial: 94.3 mmHg (ref 32.0–48.0)
pCO2 arterial: 94.7 mmHg (ref 32.0–48.0)
pH, Arterial: 7.276 — ABNORMAL LOW (ref 7.350–7.450)
pH, Arterial: 7.286 — ABNORMAL LOW (ref 7.350–7.450)
pH, Arterial: 7.291 — ABNORMAL LOW (ref 7.350–7.450)
pO2, Arterial: 110 mmHg — ABNORMAL HIGH (ref 83.0–108.0)
pO2, Arterial: 98.1 mmHg (ref 83.0–108.0)
pO2, Arterial: <31 mmHg — CL (ref 83.0–108.0)

## 2021-11-14 LAB — RENAL FUNCTION PANEL
Albumin: 3 g/dL — ABNORMAL LOW (ref 3.5–5.0)
Anion gap: 9 (ref 5–15)
BUN: 37 mg/dL — ABNORMAL HIGH (ref 8–23)
CO2: 41 mmol/L — ABNORMAL HIGH (ref 22–32)
Calcium: 9.8 mg/dL (ref 8.9–10.3)
Chloride: 93 mmol/L — ABNORMAL LOW (ref 98–111)
Creatinine, Ser: 1.06 mg/dL — ABNORMAL HIGH (ref 0.44–1.00)
GFR, Estimated: 55 mL/min — ABNORMAL LOW (ref >60)
Glucose, Bld: 138 mg/dL — ABNORMAL HIGH (ref 70–99)
Phosphorus: 4.6 mg/dL (ref 2.5–4.6)
Potassium: 6 mmol/L — ABNORMAL HIGH (ref 3.5–5.1)
Sodium: 143 mmol/L (ref 135–145)

## 2021-11-14 LAB — MAGNESIUM: Magnesium: 3 mg/dL — ABNORMAL HIGH (ref 1.7–2.4)

## 2021-11-14 NOTE — Telephone Encounter (Signed)
Called patient's daughter but she did not answer. I could not leave a VM. Will attempt to call back later.

## 2021-11-14 NOTE — Telephone Encounter (Signed)
Lmtcb for pts daughter, Cala Bradford (on [place]).

## 2021-11-15 LAB — BLOOD GAS, ARTERIAL
Acid-Base Excess: 15.5 mmol/L — ABNORMAL HIGH (ref 0.0–2.0)
Acid-Base Excess: 13.2 mmol/L — ABNORMAL HIGH (ref 0.0–2.0)
Bicarbonate: 45.2 mmol/L — ABNORMAL HIGH (ref 20.0–28.0)
Bicarbonate: 41.8 mmol/L — ABNORMAL HIGH (ref 20.0–28.0)
Drawn by: 164
FIO2: 32
FIO2: 50
O2 Saturation: 97 %
O2 Saturation: 97.3 %
Patient temperature: 36.7
Patient temperature: 37
pCO2 arterial: >120 mmHg (ref 32.0–48.0)
pCO2 arterial: 119 mmHg (ref 32.0–48.0)
pH, Arterial: 7.111 — CL (ref 7.350–7.450)
pH, Arterial: 7.173 — CL (ref 7.350–7.450)
pO2, Arterial: 117 mmHg — ABNORMAL HIGH (ref 83.0–108.0)
pO2, Arterial: 120 mmHg — ABNORMAL HIGH (ref 83.0–108.0)

## 2021-11-15 LAB — URINALYSIS, ROUTINE W REFLEX MICROSCOPIC
Bilirubin Urine: NEGATIVE
Glucose, UA: NEGATIVE mg/dL
Hgb urine dipstick: NEGATIVE
Ketones, ur: NEGATIVE mg/dL
Leukocytes,Ua: NEGATIVE
Nitrite: NEGATIVE
Protein, ur: NEGATIVE mg/dL
Specific Gravity, Urine: 1.01 (ref 1.005–1.030)
pH: 5 (ref 5.0–8.0)

## 2021-11-15 LAB — POTASSIUM: Potassium: 5.1 mmol/L (ref 3.5–5.1)

## 2021-11-16 LAB — BLOOD GAS, ARTERIAL
Acid-Base Excess: 16.9 mmol/L — ABNORMAL HIGH (ref 0.0–2.0)
Bicarbonate: 45 mmol/L — ABNORMAL HIGH (ref 20.0–28.0)
FIO2: 100
O2 Saturation: 99.2 %
Patient temperature: 36.2
pCO2 arterial: 110 mmHg (ref 32.0–48.0)
pH, Arterial: 7.231 — ABNORMAL LOW (ref 7.350–7.450)
pO2, Arterial: 336 mmHg — ABNORMAL HIGH (ref 83.0–108.0)

## 2021-11-16 LAB — RENAL FUNCTION PANEL
Albumin: 3 g/dL — ABNORMAL LOW (ref 3.5–5.0)
Anion gap: 8 (ref 5–15)
BUN: 41 mg/dL — ABNORMAL HIGH (ref 8–23)
CO2: 44 mmol/L — ABNORMAL HIGH (ref 22–32)
Calcium: 9.4 mg/dL (ref 8.9–10.3)
Chloride: 91 mmol/L — ABNORMAL LOW (ref 98–111)
Creatinine, Ser: 1.13 mg/dL — ABNORMAL HIGH (ref 0.44–1.00)
GFR, Estimated: 51 mL/min — ABNORMAL LOW (ref >60)
Glucose, Bld: 122 mg/dL — ABNORMAL HIGH (ref 70–99)
Phosphorus: 4.9 mg/dL — ABNORMAL HIGH (ref 2.5–4.6)
Potassium: 5.5 mmol/L — ABNORMAL HIGH (ref 3.5–5.1)
Sodium: 143 mmol/L (ref 135–145)

## 2021-11-16 LAB — CBC
HCT: 26.6 % — ABNORMAL LOW (ref 36.0–46.0)
Hemoglobin: 7.8 g/dL — ABNORMAL LOW (ref 12.0–15.0)
MCH: 29 pg (ref 26.0–34.0)
MCHC: 29.3 g/dL — ABNORMAL LOW (ref 30.0–36.0)
MCV: 98.9 fL (ref 80.0–100.0)
Platelets: 150 10*3/uL (ref 150–400)
RBC: 2.69 MIL/uL — ABNORMAL LOW (ref 3.87–5.11)
RDW: 17.3 % — ABNORMAL HIGH (ref 11.5–15.5)
WBC: 4.8 10*3/uL (ref 4.0–10.5)
nRBC: 0.4 % — ABNORMAL HIGH (ref 0.0–0.2)

## 2021-11-16 LAB — URINE CULTURE: Culture: <10000 — AB

## 2021-11-16 LAB — MAGNESIUM: Magnesium: 2.6 mg/dL — ABNORMAL HIGH (ref 1.7–2.4)

## 2021-11-17 LAB — BLOOD GAS, ARTERIAL
Acid-Base Excess: 19.3 mmol/L — ABNORMAL HIGH (ref 0.0–2.0)
Acid-Base Excess: 19.1 mmol/L — ABNORMAL HIGH (ref 0.0–2.0)
Allens test (pass/fail): POSITIVE — AB
Bicarbonate: 46.1 mmol/L — ABNORMAL HIGH (ref 20.0–28.0)
Bicarbonate: 45.7 mmol/L — ABNORMAL HIGH (ref 20.0–28.0)
Drawn by: 164
FIO2: 50
FIO2: 35
O2 Saturation: 96.5 %
O2 Saturation: 94.8 %
Patient temperature: 37
Patient temperature: 37
pCO2 arterial: 88.4 mmHg (ref 32.0–48.0)
pCO2 arterial: 85.6 mmHg (ref 32.0–48.0)
pH, Arterial: 7.337 — ABNORMAL LOW (ref 7.350–7.450)
pH, Arterial: 7.347 — ABNORMAL LOW (ref 7.350–7.450)
pO2, Arterial: 154 mmHg — ABNORMAL HIGH (ref 83.0–108.0)
pO2, Arterial: 95.9 mmHg (ref 83.0–108.0)

## 2021-11-17 LAB — POTASSIUM: Potassium: 4.3 mmol/L (ref 3.5–5.1)

## 2021-11-18 ENCOUNTER — Other Ambulatory Visit (HOSPITAL_COMMUNITY): Payer: Medicare Other

## 2021-11-18 LAB — BLOOD GAS, ARTERIAL
Acid-Base Excess: 17.6 mmol/L — ABNORMAL HIGH (ref 0.0–2.0)
Bicarbonate: 45.5 mmol/L — ABNORMAL HIGH (ref 20.0–28.0)
Drawn by: 164
FIO2: 28
O2 Saturation: 94.3 %
Patient temperature: 37
pCO2 arterial: 110 mmHg (ref 32.0–48.0)
pH, Arterial: 7.241 — ABNORMAL LOW (ref 7.350–7.450)
pO2, Arterial: 80.3 mmHg — ABNORMAL LOW (ref 83.0–108.0)

## 2021-11-19 ENCOUNTER — Telehealth: Payer: Self-pay | Admitting: Internal Medicine

## 2021-11-19 NOTE — Telephone Encounter (Signed)
In doing chart review on patient panel, saw that patient was deceased.  In prior notes have discussed GOC with son and daugther and length.  Called daughter- voicemail left.  Called to give our condolences on behalf of all of [facility] and vascular.  Riley Lam, MD Cardiologist [facility]  [address] [place], [address] [place], [place] [address] [phone]  4:45 PM

## 2021-11-23 LAB — CULTURE, RESPIRATORY W GRAM STAIN: Gram Stain: NONE SEEN

## 2021-11-29 ENCOUNTER — Ambulatory Visit: Payer: Medicare Other | Admitting: Internal Medicine

## 2021-12-02 ENCOUNTER — Telehealth: Payer: Medicare Other

## 2021-12-04 DEATH — deceased

## 2021-12-07 LAB — ACID FAST CULTURE WITH REFLEXED SENSITIVITIES (MYCOBACTERIA): Acid Fast Culture: NEGATIVE

## 2021-12-20 NOTE — Telephone Encounter (Signed)
Checked pt's chart and saw that pt has passed away. Pt passed away 12/16/2021.
